# Patient Record
Sex: Female | Born: 1945
Health system: Southern US, Community
[De-identification: ages and names within clinical notes are randomized; demographics above are authoritative.]

## PROBLEM LIST (undated history)

## (undated) DIAGNOSIS — K635 Polyp of colon: Secondary | ICD-10-CM

## (undated) DIAGNOSIS — Z8719 Personal history of other diseases of the digestive system: Secondary | ICD-10-CM

## (undated) DIAGNOSIS — N186 End stage renal disease: Secondary | ICD-10-CM

## (undated) DIAGNOSIS — Z8489 Family history of other specified conditions: Secondary | ICD-10-CM

## (undated) DIAGNOSIS — R519 Headache, unspecified: Secondary | ICD-10-CM

## (undated) DIAGNOSIS — R42 Dizziness and giddiness: Secondary | ICD-10-CM

## (undated) DIAGNOSIS — R011 Cardiac murmur, unspecified: Secondary | ICD-10-CM

## (undated) DIAGNOSIS — K602 Anal fissure, unspecified: Secondary | ICD-10-CM

## (undated) DIAGNOSIS — K859 Acute pancreatitis without necrosis or infection, unspecified: Secondary | ICD-10-CM

## (undated) DIAGNOSIS — G43909 Migraine, unspecified, not intractable, without status migrainosus: Secondary | ICD-10-CM

## (undated) DIAGNOSIS — R51 Headache: Secondary | ICD-10-CM

## (undated) DIAGNOSIS — F319 Bipolar disorder, unspecified: Secondary | ICD-10-CM

## (undated) DIAGNOSIS — K5792 Diverticulitis of intestine, part unspecified, without perforation or abscess without bleeding: Secondary | ICD-10-CM

## (undated) DIAGNOSIS — I10 Essential (primary) hypertension: Secondary | ICD-10-CM

## (undated) DIAGNOSIS — K219 Gastro-esophageal reflux disease without esophagitis: Secondary | ICD-10-CM

## (undated) DIAGNOSIS — N189 Chronic kidney disease, unspecified: Secondary | ICD-10-CM

## (undated) HISTORY — DX: Anal fissure, unspecified: K60.2

## (undated) HISTORY — DX: Diverticulitis of intestine, part unspecified, without perforation or abscess without bleeding: K57.92

## (undated) HISTORY — DX: Polyp of colon: K63.5

## (undated) HISTORY — DX: Bipolar disorder, unspecified: F31.9

## (undated) HISTORY — DX: Chronic kidney disease, unspecified: N18.9

## (undated) HISTORY — DX: Dizziness and giddiness: R42

## (undated) HISTORY — DX: Acute pancreatitis without necrosis or infection, unspecified: K85.90

## (undated) HISTORY — PX: AV FISTULA PLACEMENT: SHX1204

## (undated) HISTORY — PX: ABDOMINAL HYSTERECTOMY: SHX81

## (undated) HISTORY — PX: EYE SURGERY: SHX253

---

## 1992-07-21 HISTORY — PX: BREAST EXCISIONAL BIOPSY: SUR124

## 2001-07-21 HISTORY — PX: CHOLECYSTECTOMY: SHX55

## 2004-07-29 ENCOUNTER — Ambulatory Visit: Payer: Self-pay

## 2005-02-18 ENCOUNTER — Emergency Department: Payer: Self-pay | Admitting: Emergency Medicine

## 2005-02-20 ENCOUNTER — Emergency Department: Payer: Self-pay | Admitting: Unknown Physician Specialty

## 2005-03-02 ENCOUNTER — Emergency Department: Payer: Self-pay | Admitting: Emergency Medicine

## 2005-10-17 ENCOUNTER — Inpatient Hospital Stay: Payer: Self-pay | Admitting: Unknown Physician Specialty

## 2006-03-10 ENCOUNTER — Ambulatory Visit: Payer: Self-pay

## 2007-06-15 ENCOUNTER — Inpatient Hospital Stay: Payer: Self-pay | Admitting: Internal Medicine

## 2007-06-25 ENCOUNTER — Emergency Department: Payer: Self-pay | Admitting: Internal Medicine

## 2007-09-17 ENCOUNTER — Ambulatory Visit: Payer: Self-pay | Admitting: Gastroenterology

## 2007-12-29 ENCOUNTER — Ambulatory Visit: Payer: Self-pay | Admitting: Family Medicine

## 2008-08-26 ENCOUNTER — Emergency Department: Payer: Self-pay | Admitting: Emergency Medicine

## 2009-01-03 ENCOUNTER — Ambulatory Visit: Payer: Self-pay | Admitting: Family Medicine

## 2010-02-27 ENCOUNTER — Other Ambulatory Visit: Payer: Self-pay | Admitting: Internal Medicine

## 2010-07-28 ENCOUNTER — Emergency Department: Payer: Self-pay | Admitting: Emergency Medicine

## 2011-01-06 ENCOUNTER — Emergency Department: Payer: Self-pay | Admitting: Emergency Medicine

## 2011-06-08 ENCOUNTER — Emergency Department: Payer: Self-pay | Admitting: Emergency Medicine

## 2011-11-05 ENCOUNTER — Ambulatory Visit: Payer: Self-pay | Admitting: Internal Medicine

## 2011-11-06 ENCOUNTER — Ambulatory Visit: Payer: Self-pay | Admitting: Internal Medicine

## 2011-12-05 ENCOUNTER — Ambulatory Visit: Payer: Self-pay | Admitting: Ophthalmology

## 2011-12-05 DIAGNOSIS — I10 Essential (primary) hypertension: Secondary | ICD-10-CM

## 2011-12-16 ENCOUNTER — Ambulatory Visit: Payer: Self-pay | Admitting: Ophthalmology

## 2011-12-30 ENCOUNTER — Ambulatory Visit: Payer: Self-pay | Admitting: Ophthalmology

## 2012-01-23 ENCOUNTER — Ambulatory Visit: Payer: Self-pay | Admitting: Ophthalmology

## 2012-07-21 HISTORY — PX: COLONOSCOPY: SHX174

## 2012-09-04 ENCOUNTER — Emergency Department: Payer: Self-pay | Admitting: Emergency Medicine

## 2012-12-06 ENCOUNTER — Ambulatory Visit: Payer: Self-pay | Admitting: Internal Medicine

## 2013-03-03 ENCOUNTER — Emergency Department: Payer: Self-pay | Admitting: Emergency Medicine

## 2013-03-03 LAB — CBC
HCT: 34 % — ABNORMAL LOW (ref 35.0–47.0)
HGB: 12 g/dL (ref 12.0–16.0)
MCH: 33.3 pg (ref 26.0–34.0)
MCV: 94 fL (ref 80–100)
RBC: 3.61 10*6/uL — ABNORMAL LOW (ref 3.80–5.20)

## 2013-03-03 LAB — URINALYSIS, COMPLETE
Bilirubin,UR: NEGATIVE
Glucose,UR: NEGATIVE mg/dL (ref 0–75)
Ketone: NEGATIVE
Nitrite: NEGATIVE
Ph: 6 (ref 4.5–8.0)
Protein: NEGATIVE
RBC,UR: 2 /HPF (ref 0–5)
Specific Gravity: 1.01 (ref 1.003–1.030)
Squamous Epithelial: 1
WBC UR: 8 /HPF (ref 0–5)

## 2013-03-03 LAB — COMPREHENSIVE METABOLIC PANEL
Albumin: 3 g/dL — ABNORMAL LOW (ref 3.4–5.0)
Alkaline Phosphatase: 67 U/L (ref 50–136)
BUN: 27 mg/dL — ABNORMAL HIGH (ref 7–18)
Bilirubin,Total: 0.6 mg/dL (ref 0.2–1.0)
Calcium, Total: 8.5 mg/dL (ref 8.5–10.1)
Chloride: 109 mmol/L — ABNORMAL HIGH (ref 98–107)
Co2: 26 mmol/L (ref 21–32)
Osmolality: 286 (ref 275–301)
SGPT (ALT): 28 U/L (ref 12–78)
Total Protein: 6.5 g/dL (ref 6.4–8.2)

## 2013-03-03 LAB — LIPASE, BLOOD: Lipase: 268 U/L (ref 73–393)

## 2013-04-15 ENCOUNTER — Encounter: Payer: Self-pay | Admitting: *Deleted

## 2013-05-10 ENCOUNTER — Ambulatory Visit (INDEPENDENT_AMBULATORY_CARE_PROVIDER_SITE_OTHER): Payer: Medicare Other | Admitting: General Surgery

## 2013-05-10 ENCOUNTER — Encounter: Payer: Self-pay | Admitting: General Surgery

## 2013-05-10 VITALS — BP 130/74 | HR 68 | Resp 12 | Ht 63.0 in | Wt 187.0 lb

## 2013-05-10 DIAGNOSIS — Z860100 Personal history of colon polyps, unspecified: Secondary | ICD-10-CM | POA: Insufficient documentation

## 2013-05-10 MED ORDER — POLYETHYLENE GLYCOL 3350 17 GM/SCOOP PO POWD: ORAL | Status: DC

## 2013-05-10 NOTE — Patient Instructions (Addendum)
Colonoscopy A colonoscopy is an exam to evaluate your entire colon. In this exam, your colon is cleansed. A long fiberoptic tube is inserted through your rectum and into your colon. The fiberoptic scope (endoscope) is a long bundle of enclosed and very flexible fibers. These fibers transmit light to the area examined and send images from that area to your caregiver. Discomfort is usually minimal. You may be given a drug to help you sleep (sedative) during or prior to the procedure. This exam helps to detect lumps (tumors), polyps, inflammation, and areas of bleeding. Your caregiver may also take a small piece of tissue (biopsy) that will be examined under a microscope. LET YOUR CAREGIVER KNOW ABOUT:   Allergies to food or medicine.  Medicines taken, including vitamins, herbs, eyedrops, over-the-counter medicines, and creams.  Use of steroids (by mouth or creams).  Previous problems with anesthetics or numbing medicines.  History of bleeding problems or blood clots.  Previous surgery.  Other health problems, including diabetes and kidney problems.  Possibility of pregnancy, if this applies. BEFORE THE PROCEDURE   A clear liquid diet may be required for 2 days before the exam.  Ask your caregiver about changing or stopping your regular medications.  Liquid injections (enemas) or laxatives may be required.  A large amount of electrolyte solution may be given to you to drink over a short period of time. This solution is used to clean out your colon.  You should be present 60 minutes prior to your procedure or as directed by your caregiver. AFTER THE PROCEDURE   If you received a sedative or pain relieving medication, you will need to arrange for someone to drive you home.  Occasionally, there is a little blood passed with the first bowel movement. Do not be concerned. FINDING OUT THE RESULTS OF YOUR TEST Not all test results are available during your visit. If your test results are  not back during the visit, make an appointment with your caregiver to find out the results. Do not assume everything is normal if you have not heard from your caregiver or the medical facility. It is important for you to follow up on all of your test results. HOME CARE INSTRUCTIONS   It is not unusual to pass moderate amounts of gas and experience mild abdominal cramping following the procedure. This is due to air being used to inflate your colon during the exam. Walking or a warm pack on your belly (abdomen) may help.  You may resume all normal meals and activities after sedatives and medicines have worn off.  Only take over-the-counter or prescription medicines for pain, discomfort, or fever as directed by your caregiver. Do not use aspirin or blood thinners if a biopsy was taken. Consult your caregiver for medicine usage if biopsies were taken. SEEK IMMEDIATE MEDICAL CARE IF:   You have a fever.  You pass large blood clots or fill a toilet with blood following the procedure. This may also occur 10 to 14 days following the procedure. This is more likely if a biopsy was taken.  You develop abdominal pain that keeps getting worse and cannot be relieved with medicine. Document Released: 07/04/2000 Document Revised: 09/29/2011 Document Reviewed: 02/17/2008 [facility] Patient Information 2014 [place].  Patient has been scheduled for a colonoscopy on 05-25-13 at [facility].

## 2013-05-10 NOTE — Progress Notes (Signed)
Patient ID: Joyce [name], female   DOB: 1945-12-20, 67 y.o.   MRN: [id number]  Chief Complaint  Patient presents with  . Other    evaluation of screening colonscopy    HPI Joyce [name] is a 67 y.o. female.here today for evaluation of surveillance colonoscopy. She had 3 polyps removed in 2009.  Denies any gastrointestinal issues such as constipation or bloody stools. Denies family history of colon cancer.      HPI  Past Medical History  Diagnosis Date  . Colon polyps   . Diverticulitis   . Kidney disease     Dr Holley Raring [name] [facility]    Past Surgical History  Procedure Laterality Date  . Cholecystectomy  2003  . Eye surgery    . Colonoscopy  2008    Dr Candace Cruise  . Abdominal hysterectomy  1990 ?    History reviewed. No pertinent family history.  Social History History  Substance Use Topics  . Smoking status: Never Smoker   . Smokeless tobacco: Not on file  . Alcohol Use: No    Allergies  Allergen Reactions  . Indomethacin Hives    Current Outpatient Prescriptions  Medication Sig Dispense Refill  . Multiple Vitamin (MULTIVITAMIN) capsule Take 1 capsule by mouth daily.      Marland Kitchen lisinopril (PRINIVIL,ZESTRIL) 20 MG tablet Take 20 mg by mouth 2 (two) times daily.       . polyethylene glycol powder (GLYCOLAX/MIRALAX) powder 255 grams one bottle for colonoscopy prep  255 g  0  . valproic acid (DEPAKENE) 250 MG capsule Take 250 mg by mouth 3 (three) times daily.       . verapamil (CALAN-SR) 240 MG CR tablet Take 240 mg by mouth at bedtime.        No current facility-administered medications for this visit.    Review of Systems Review of Systems  Constitutional: Negative.   Respiratory: Negative.   Cardiovascular: Negative.     Blood pressure 130/74, pulse 68, resp. rate 12, height 5\' 3"  (1.6 m), weight 187 lb (84.823 kg).  Physical Exam Physical Exam  Constitutional: She is oriented to person, place, and time. She appears well-developed and well-nourished.  Eyes:  Conjunctivae are normal. No scleral icterus.  Neck: Neck supple.  Cardiovascular: Normal rate, regular rhythm and normal heart sounds.   Pulmonary/Chest: Effort normal and breath sounds normal.  Abdominal: Soft. Bowel sounds are normal. There is no hepatosplenomegaly. There is no tenderness. No hernia.  Lymphadenopathy:    She has no cervical adenopathy.  Neurological: She is alert and oriented to person, place, and time.  Skin: Skin is warm and dry.    Data Reviewed Dr Jennette Kettle notes.  Assessment    History of colon polyps.    Plan    Surveillance  colonoscopy.     Patient has been scheduled for a colonoscopy on 05-25-13 at [facility].   Joyce [name] G 05/10/2013, 12:41 PM

## 2013-05-20 ENCOUNTER — Other Ambulatory Visit: Payer: Self-pay | Admitting: General Surgery

## 2013-05-20 DIAGNOSIS — Z86010 Personal history of colonic polyps: Secondary | ICD-10-CM

## 2013-05-25 ENCOUNTER — Ambulatory Visit: Payer: Self-pay | Admitting: General Surgery

## 2013-05-25 DIAGNOSIS — K5730 Diverticulosis of large intestine without perforation or abscess without bleeding: Secondary | ICD-10-CM

## 2013-05-25 DIAGNOSIS — Z86010 Personal history of colonic polyps: Secondary | ICD-10-CM

## 2013-05-27 ENCOUNTER — Encounter: Payer: Self-pay | Admitting: General Surgery

## 2013-10-31 ENCOUNTER — Ambulatory Visit: Payer: Self-pay | Admitting: Internal Medicine

## 2013-12-17 ENCOUNTER — Inpatient Hospital Stay: Payer: Self-pay | Admitting: Internal Medicine

## 2013-12-17 LAB — COMPREHENSIVE METABOLIC PANEL
ALK PHOS: 46 U/L
Albumin: 3.4 g/dL (ref 3.4–5.0)
Anion Gap: 7 (ref 7–16)
BUN: 41 mg/dL — ABNORMAL HIGH (ref 7–18)
Bilirubin,Total: 0.6 mg/dL (ref 0.2–1.0)
CALCIUM: 9 mg/dL (ref 8.5–10.1)
CO2: 23 mmol/L (ref 21–32)
CREATININE: 3.28 mg/dL — AB (ref 0.60–1.30)
Chloride: 109 mmol/L — ABNORMAL HIGH (ref 98–107)
EGFR (Non-African Amer.): 14 — ABNORMAL LOW
GFR CALC AF AMER: 16 — AB
Glucose: 94 mg/dL (ref 65–99)
Osmolality: 287 (ref 275–301)
POTASSIUM: 4.3 mmol/L (ref 3.5–5.1)
SGOT(AST): 20 U/L (ref 15–37)
SGPT (ALT): 12 U/L (ref 12–78)
Sodium: 139 mmol/L (ref 136–145)
Total Protein: 6.8 g/dL (ref 6.4–8.2)

## 2013-12-17 LAB — CBC
HCT: 31.8 % — ABNORMAL LOW (ref 35.0–47.0)
HGB: 10.7 g/dL — AB (ref 12.0–16.0)
MCH: 32.7 pg (ref 26.0–34.0)
MCHC: 33.8 g/dL (ref 32.0–36.0)
MCV: 97 fL (ref 80–100)
PLATELETS: 217 10*3/uL (ref 150–440)
RBC: 3.28 10*6/uL — ABNORMAL LOW (ref 3.80–5.20)
RDW: 12.4 % (ref 11.5–14.5)
WBC: 6.9 10*3/uL (ref 3.6–11.0)

## 2013-12-17 LAB — TROPONIN I: Troponin-I: <0.02 ng/mL

## 2013-12-18 LAB — CK TOTAL AND CKMB (NOT AT ARMC)
CK, TOTAL: 79 U/L
CK, Total: 80 U/L
CK-MB: 0.8 ng/mL (ref 0.5–3.6)
CK-MB: 1.1 ng/mL (ref 0.5–3.6)

## 2013-12-18 LAB — BASIC METABOLIC PANEL
Anion Gap: 3 — ABNORMAL LOW (ref 7–16)
BUN: 39 mg/dL — AB (ref 7–18)
CALCIUM: 8.2 mg/dL — AB (ref 8.5–10.1)
Chloride: 117 mmol/L — ABNORMAL HIGH (ref 98–107)
Co2: 23 mmol/L (ref 21–32)
Creatinine: 3.01 mg/dL — ABNORMAL HIGH (ref 0.60–1.30)
EGFR (Non-African Amer.): 15 — ABNORMAL LOW
GFR CALC AF AMER: 18 — AB
Glucose: 78 mg/dL (ref 65–99)
Osmolality: 293 (ref 275–301)
POTASSIUM: 4.4 mmol/L (ref 3.5–5.1)
Sodium: 143 mmol/L (ref 136–145)

## 2013-12-18 LAB — CBC WITH DIFFERENTIAL/PLATELET
BASOS ABS: 0 10*3/uL (ref 0.0–0.1)
Basophil %: 0.6 %
Eosinophil #: 0.1 10*3/uL (ref 0.0–0.7)
Eosinophil %: 2.3 %
HCT: 29.4 % — AB (ref 35.0–47.0)
HGB: 9.9 g/dL — ABNORMAL LOW (ref 12.0–16.0)
LYMPHS PCT: 32 %
Lymphocyte #: 1.7 10*3/uL (ref 1.0–3.6)
MCH: 32.7 pg (ref 26.0–34.0)
MCHC: 33.6 g/dL (ref 32.0–36.0)
MCV: 97 fL (ref 80–100)
MONOS PCT: 9.3 %
Monocyte #: 0.5 x10 3/mm (ref 0.2–0.9)
NEUTROS PCT: 55.8 %
Neutrophil #: 2.9 10*3/uL (ref 1.4–6.5)
Platelet: 206 10*3/uL (ref 150–440)
RBC: 3.02 10*6/uL — ABNORMAL LOW (ref 3.80–5.20)
RDW: 12.5 % (ref 11.5–14.5)
WBC: 5.2 10*3/uL (ref 3.6–11.0)

## 2013-12-18 LAB — TROPONIN I

## 2013-12-22 ENCOUNTER — Ambulatory Visit: Payer: Self-pay | Admitting: Internal Medicine

## 2014-05-03 ENCOUNTER — Emergency Department: Payer: Self-pay | Admitting: Emergency Medicine

## 2014-05-03 LAB — URINALYSIS, COMPLETE
BILIRUBIN, UR: NEGATIVE
BLOOD: NEGATIVE
Bacteria: NONE SEEN
GLUCOSE, UR: NEGATIVE mg/dL (ref 0–75)
Ketone: NEGATIVE
Nitrite: NEGATIVE
PH: 5 (ref 4.5–8.0)
Protein: NEGATIVE
RBC,UR: 1 /HPF (ref 0–5)
SPECIFIC GRAVITY: 1.013 (ref 1.003–1.030)

## 2014-05-03 LAB — CBC WITH DIFFERENTIAL/PLATELET
BASOS PCT: 0.2 %
Basophil #: 0 10*3/uL (ref 0.0–0.1)
EOS ABS: 0.1 10*3/uL (ref 0.0–0.7)
Eosinophil %: 1.1 %
HCT: 34.4 % — ABNORMAL LOW (ref 35.0–47.0)
HGB: 11.3 g/dL — AB (ref 12.0–16.0)
Lymphocyte #: 2.2 10*3/uL (ref 1.0–3.6)
Lymphocyte %: 28 %
MCH: 32.3 pg (ref 26.0–34.0)
MCHC: 32.7 g/dL (ref 32.0–36.0)
MCV: 99 fL (ref 80–100)
Monocyte #: 0.8 x10 3/mm (ref 0.2–0.9)
Monocyte %: 9.4 %
NEUTROS PCT: 61.3 %
Neutrophil #: 4.9 10*3/uL (ref 1.4–6.5)
Platelet: 238 10*3/uL (ref 150–440)
RBC: 3.48 10*6/uL — AB (ref 3.80–5.20)
RDW: 13.5 % (ref 11.5–14.5)
WBC: 8 10*3/uL (ref 3.6–11.0)

## 2014-05-03 LAB — COMPREHENSIVE METABOLIC PANEL
ALBUMIN: 3.3 g/dL — AB (ref 3.4–5.0)
Alkaline Phosphatase: 58 U/L
Anion Gap: 8 (ref 7–16)
BILIRUBIN TOTAL: 0.9 mg/dL (ref 0.2–1.0)
BUN: 36 mg/dL — ABNORMAL HIGH (ref 7–18)
CO2: 24 mmol/L (ref 21–32)
Calcium, Total: 8.6 mg/dL (ref 8.5–10.1)
Chloride: 107 mmol/L (ref 98–107)
Creatinine: 3.47 mg/dL — ABNORMAL HIGH (ref 0.60–1.30)
EGFR (African American): 17 — ABNORMAL LOW
EGFR (Non-African Amer.): 14 — ABNORMAL LOW
Glucose: 91 mg/dL (ref 65–99)
Osmolality: 285 (ref 275–301)
POTASSIUM: 3.4 mmol/L — AB (ref 3.5–5.1)
SGOT(AST): 30 U/L (ref 15–37)
SGPT (ALT): 30 U/L
SODIUM: 139 mmol/L (ref 136–145)
Total Protein: 7 g/dL (ref 6.4–8.2)

## 2014-05-03 LAB — LIPASE, BLOOD: LIPASE: 320 U/L (ref 73–393)

## 2014-05-06 ENCOUNTER — Emergency Department: Payer: Self-pay | Admitting: Emergency Medicine

## 2014-05-06 LAB — CBC WITH DIFFERENTIAL/PLATELET
Basophil #: 0.1 10*3/uL (ref 0.0–0.1)
Basophil %: 2 %
EOS PCT: 3.8 %
Eosinophil #: 0.2 10*3/uL (ref 0.0–0.7)
HCT: 34.8 % — ABNORMAL LOW (ref 35.0–47.0)
HGB: 11.3 g/dL — ABNORMAL LOW (ref 12.0–16.0)
LYMPHS ABS: 1 10*3/uL (ref 1.0–3.6)
LYMPHS PCT: 16.2 %
MCH: 32.1 pg (ref 26.0–34.0)
MCHC: 32.4 g/dL (ref 32.0–36.0)
MCV: 99 fL (ref 80–100)
MONOS PCT: 8.7 %
Monocyte #: 0.6 x10 3/mm (ref 0.2–0.9)
Neutrophil #: 4.5 10*3/uL (ref 1.4–6.5)
Neutrophil %: 69.3 %
Platelet: 258 10*3/uL (ref 150–440)
RBC: 3.51 10*6/uL — ABNORMAL LOW (ref 3.80–5.20)
RDW: 13.2 % (ref 11.5–14.5)
WBC: 6.5 10*3/uL (ref 3.6–11.0)

## 2014-05-06 LAB — COMPREHENSIVE METABOLIC PANEL
ALK PHOS: 52 U/L
AST: 37 U/L (ref 15–37)
Albumin: 3.1 g/dL — ABNORMAL LOW (ref 3.4–5.0)
Anion Gap: 9 (ref 7–16)
BILIRUBIN TOTAL: 0.9 mg/dL (ref 0.2–1.0)
BUN: 29 mg/dL — ABNORMAL HIGH (ref 7–18)
CO2: 22 mmol/L (ref 21–32)
Calcium, Total: 8.4 mg/dL — ABNORMAL LOW (ref 8.5–10.1)
Chloride: 113 mmol/L — ABNORMAL HIGH (ref 98–107)
Creatinine: 3.07 mg/dL — ABNORMAL HIGH (ref 0.60–1.30)
EGFR (African American): 20 — ABNORMAL LOW
GFR CALC NON AF AMER: 16 — AB
Glucose: 92 mg/dL (ref 65–99)
OSMOLALITY: 292 (ref 275–301)
Potassium: 3.6 mmol/L (ref 3.5–5.1)
SGPT (ALT): 30 U/L
SODIUM: 144 mmol/L (ref 136–145)
TOTAL PROTEIN: 6.4 g/dL (ref 6.4–8.2)

## 2014-05-06 LAB — URINALYSIS, COMPLETE
Bacteria: NONE SEEN
Bilirubin,UR: NEGATIVE
Blood: NEGATIVE
Glucose,UR: NEGATIVE mg/dL (ref 0–75)
Ketone: NEGATIVE
Nitrite: NEGATIVE
PROTEIN: NEGATIVE
Ph: 5 (ref 4.5–8.0)
RBC,UR: 1 /HPF (ref 0–5)
SPECIFIC GRAVITY: 1.01 (ref 1.003–1.030)
Squamous Epithelial: 1
WBC UR: 3 /HPF (ref 0–5)

## 2014-05-06 LAB — LIPASE, BLOOD: LIPASE: 240 U/L (ref 73–393)

## 2014-05-10 ENCOUNTER — Inpatient Hospital Stay: Payer: Self-pay | Admitting: Internal Medicine

## 2014-05-10 LAB — COMPREHENSIVE METABOLIC PANEL
ALT: 20 U/L
Albumin: 3 g/dL — ABNORMAL LOW (ref 3.4–5.0)
Alkaline Phosphatase: 48 U/L
Anion Gap: 9 (ref 7–16)
BUN: 21 mg/dL — ABNORMAL HIGH (ref 7–18)
Bilirubin,Total: 0.9 mg/dL (ref 0.2–1.0)
CALCIUM: 8.3 mg/dL — AB (ref 8.5–10.1)
CHLORIDE: 116 mmol/L — AB (ref 98–107)
CO2: 20 mmol/L — AB (ref 21–32)
Creatinine: 2.64 mg/dL — ABNORMAL HIGH (ref 0.60–1.30)
EGFR (African American): 23 — ABNORMAL LOW
EGFR (Non-African Amer.): 19 — ABNORMAL LOW
Glucose: 110 mg/dL — ABNORMAL HIGH (ref 65–99)
Osmolality: 292 (ref 275–301)
POTASSIUM: 3.4 mmol/L — AB (ref 3.5–5.1)
SGOT(AST): 19 U/L (ref 15–37)
Sodium: 145 mmol/L (ref 136–145)
Total Protein: 6 g/dL — ABNORMAL LOW (ref 6.4–8.2)

## 2014-05-10 LAB — URINALYSIS, COMPLETE
BACTERIA: NONE SEEN
BILIRUBIN, UR: NEGATIVE
Blood: NEGATIVE
Glucose,UR: NEGATIVE mg/dL (ref 0–75)
Hyaline Cast: 3
Ketone: NEGATIVE
LEUKOCYTE ESTERASE: NEGATIVE
Nitrite: NEGATIVE
Ph: 5 (ref 4.5–8.0)
Protein: NEGATIVE
RBC,UR: 1 /HPF (ref 0–5)
Specific Gravity: 1.009 (ref 1.003–1.030)
Squamous Epithelial: 2
WBC UR: 2 /HPF (ref 0–5)

## 2014-05-10 LAB — CBC
HCT: 33.4 % — ABNORMAL LOW (ref 35.0–47.0)
HGB: 11.1 g/dL — AB (ref 12.0–16.0)
MCH: 32.7 pg (ref 26.0–34.0)
MCHC: 33.4 g/dL (ref 32.0–36.0)
MCV: 98 fL (ref 80–100)
Platelet: 239 10*3/uL (ref 150–440)
RBC: 3.41 10*6/uL — ABNORMAL LOW (ref 3.80–5.20)
RDW: 13.2 % (ref 11.5–14.5)
WBC: 6.5 10*3/uL (ref 3.6–11.0)

## 2014-05-10 LAB — LIPASE, BLOOD: Lipase: 412 U/L — ABNORMAL HIGH (ref 73–393)

## 2014-05-11 LAB — COMPREHENSIVE METABOLIC PANEL
ALT: 70 U/L — AB
Albumin: 2.4 g/dL — ABNORMAL LOW (ref 3.4–5.0)
Alkaline Phosphatase: 59 U/L
Anion Gap: 8 (ref 7–16)
BUN: 17 mg/dL (ref 7–18)
Bilirubin,Total: 0.6 mg/dL (ref 0.2–1.0)
CALCIUM: 7.7 mg/dL — AB (ref 8.5–10.1)
CHLORIDE: 121 mmol/L — AB (ref 98–107)
Co2: 21 mmol/L (ref 21–32)
Creatinine: 2.35 mg/dL — ABNORMAL HIGH (ref 0.60–1.30)
EGFR (African American): 27 — ABNORMAL LOW
EGFR (Non-African Amer.): 22 — ABNORMAL LOW
Glucose: 89 mg/dL (ref 65–99)
Osmolality: 299 (ref 275–301)
Potassium: 3.8 mmol/L (ref 3.5–5.1)
SGOT(AST): 77 U/L — ABNORMAL HIGH (ref 15–37)
SODIUM: 150 mmol/L — AB (ref 136–145)
Total Protein: 4.9 g/dL — ABNORMAL LOW (ref 6.4–8.2)

## 2014-05-11 LAB — CBC WITH DIFFERENTIAL/PLATELET
Basophil #: 0 10*3/uL (ref 0.0–0.1)
Basophil %: 0.5 %
EOS ABS: 0.1 10*3/uL (ref 0.0–0.7)
EOS PCT: 1.5 %
HCT: 28.1 % — ABNORMAL LOW (ref 35.0–47.0)
HGB: 9.5 g/dL — AB (ref 12.0–16.0)
LYMPHS ABS: 1.6 10*3/uL (ref 1.0–3.6)
Lymphocyte %: 28.3 %
MCH: 33.3 pg (ref 26.0–34.0)
MCHC: 33.9 g/dL (ref 32.0–36.0)
MCV: 98 fL (ref 80–100)
MONOS PCT: 11.3 %
Monocyte #: 0.6 x10 3/mm (ref 0.2–0.9)
Neutrophil #: 3.3 10*3/uL (ref 1.4–6.5)
Neutrophil %: 58.4 %
Platelet: 186 10*3/uL (ref 150–440)
RBC: 2.86 10*6/uL — AB (ref 3.80–5.20)
RDW: 13 % (ref 11.5–14.5)
WBC: 5.6 10*3/uL (ref 3.6–11.0)

## 2014-05-11 LAB — TSH: Thyroid Stimulating Horm: 2.74 u[IU]/mL

## 2014-05-11 LAB — MAGNESIUM: MAGNESIUM: 1.4 mg/dL — AB

## 2014-05-11 LAB — CLOSTRIDIUM DIFFICILE(ARMC)

## 2014-05-11 LAB — LIPASE, BLOOD: LIPASE: 1210 U/L — AB (ref 73–393)

## 2014-05-11 LAB — OCCULT BLOOD X 1 CARD TO LAB, STOOL: Occult Blood, Feces: NEGATIVE

## 2014-05-12 LAB — COMPREHENSIVE METABOLIC PANEL
Albumin: 2.4 g/dL — ABNORMAL LOW (ref 3.4–5.0)
Alkaline Phosphatase: 50 U/L
Anion Gap: 6 — ABNORMAL LOW (ref 7–16)
BUN: 15 mg/dL (ref 7–18)
Bilirubin,Total: 0.7 mg/dL (ref 0.2–1.0)
CALCIUM: 8.2 mg/dL — AB (ref 8.5–10.1)
CHLORIDE: 119 mmol/L — AB (ref 98–107)
CO2: 23 mmol/L (ref 21–32)
Creatinine: 2.23 mg/dL — ABNORMAL HIGH (ref 0.60–1.30)
EGFR (African American): 28 — ABNORMAL LOW
EGFR (Non-African Amer.): 23 — ABNORMAL LOW
Glucose: 96 mg/dL (ref 65–99)
Osmolality: 295 (ref 275–301)
POTASSIUM: 3.4 mmol/L — AB (ref 3.5–5.1)
SGOT(AST): 27 U/L (ref 15–37)
SGPT (ALT): 41 U/L
SODIUM: 148 mmol/L — AB (ref 136–145)
TOTAL PROTEIN: 5 g/dL — AB (ref 6.4–8.2)

## 2014-05-12 LAB — LIPASE, BLOOD: LIPASE: 536 U/L — AB (ref 73–393)

## 2014-05-13 LAB — BASIC METABOLIC PANEL
Anion Gap: 7 (ref 7–16)
BUN: 11 mg/dL (ref 7–18)
CALCIUM: 8.4 mg/dL — AB (ref 8.5–10.1)
CO2: 24 mmol/L (ref 21–32)
Chloride: 118 mmol/L — ABNORMAL HIGH (ref 98–107)
Creatinine: 2.34 mg/dL — ABNORMAL HIGH (ref 0.60–1.30)
EGFR (African American): 27 — ABNORMAL LOW
EGFR (Non-African Amer.): 22 — ABNORMAL LOW
Glucose: 119 mg/dL — ABNORMAL HIGH (ref 65–99)
OSMOLALITY: 297 (ref 275–301)
POTASSIUM: 3.9 mmol/L (ref 3.5–5.1)
SODIUM: 149 mmol/L — AB (ref 136–145)

## 2014-05-13 LAB — LIPASE, BLOOD: LIPASE: 569 U/L — AB (ref 73–393)

## 2014-05-13 LAB — URINE CULTURE

## 2014-05-14 LAB — LIPID PANEL
CHOLESTEROL: 105 mg/dL (ref 0–200)
HDL Cholesterol: 37 mg/dL — ABNORMAL LOW (ref 40–60)
LDL CHOLESTEROL, CALC: 43 mg/dL (ref 0–100)
TRIGLYCERIDES: 127 mg/dL (ref 0–200)
VLDL Cholesterol, Calc: 25 mg/dL (ref 5–40)

## 2014-05-14 LAB — COMPREHENSIVE METABOLIC PANEL
Albumin: 2.7 g/dL — ABNORMAL LOW (ref 3.4–5.0)
Alkaline Phosphatase: 53 U/L
Anion Gap: 9 (ref 7–16)
BUN: 11 mg/dL (ref 7–18)
Bilirubin,Total: 0.5 mg/dL (ref 0.2–1.0)
CO2: 21 mmol/L (ref 21–32)
Calcium, Total: 8.3 mg/dL — ABNORMAL LOW (ref 8.5–10.1)
Chloride: 118 mmol/L — ABNORMAL HIGH (ref 98–107)
Creatinine: 2.33 mg/dL — ABNORMAL HIGH (ref 0.60–1.30)
EGFR (African American): 27 — ABNORMAL LOW
EGFR (Non-African Amer.): 22 — ABNORMAL LOW
Glucose: 113 mg/dL — ABNORMAL HIGH (ref 65–99)
OSMOLALITY: 294 (ref 275–301)
Potassium: 3.7 mmol/L (ref 3.5–5.1)
SGOT(AST): 18 U/L (ref 15–37)
SGPT (ALT): 25 U/L
Sodium: 148 mmol/L — ABNORMAL HIGH (ref 136–145)
TOTAL PROTEIN: 5.3 g/dL — AB (ref 6.4–8.2)

## 2014-05-14 LAB — STOOL CULTURE

## 2014-05-14 LAB — LIPASE, BLOOD: Lipase: 542 U/L — ABNORMAL HIGH (ref 73–393)

## 2014-05-15 LAB — BASIC METABOLIC PANEL
Anion Gap: 7 (ref 7–16)
BUN: 9 mg/dL (ref 7–18)
CHLORIDE: 115 mmol/L — AB (ref 98–107)
CO2: 22 mmol/L (ref 21–32)
Calcium, Total: 8.8 mg/dL (ref 8.5–10.1)
Creatinine: 2.33 mg/dL — ABNORMAL HIGH (ref 0.60–1.30)
EGFR (African American): 27 — ABNORMAL LOW
GFR CALC NON AF AMER: 22 — AB
Glucose: 118 mg/dL — ABNORMAL HIGH (ref 65–99)
Osmolality: 287 (ref 275–301)
Potassium: 3.7 mmol/L (ref 3.5–5.1)
Sodium: 144 mmol/L (ref 136–145)

## 2014-05-15 LAB — PLATELET COUNT: Platelet: 184 10*3/uL (ref 150–440)

## 2014-05-15 LAB — LIPASE, BLOOD: Lipase: 407 U/L — ABNORMAL HIGH (ref 73–393)

## 2014-05-22 ENCOUNTER — Encounter: Payer: Self-pay | Admitting: General Surgery

## 2014-08-09 DIAGNOSIS — D229 Melanocytic nevi, unspecified: Secondary | ICD-10-CM | POA: Diagnosis not present

## 2014-08-09 DIAGNOSIS — B079 Viral wart, unspecified: Secondary | ICD-10-CM | POA: Diagnosis not present

## 2014-08-23 DIAGNOSIS — Z79899 Other long term (current) drug therapy: Secondary | ICD-10-CM | POA: Diagnosis not present

## 2014-09-01 DIAGNOSIS — Q613 Polycystic kidney, unspecified: Secondary | ICD-10-CM | POA: Diagnosis not present

## 2014-09-01 DIAGNOSIS — N185 Chronic kidney disease, stage 5: Secondary | ICD-10-CM | POA: Diagnosis not present

## 2014-09-01 DIAGNOSIS — E559 Vitamin D deficiency, unspecified: Secondary | ICD-10-CM | POA: Diagnosis not present

## 2014-09-01 DIAGNOSIS — I10 Essential (primary) hypertension: Secondary | ICD-10-CM | POA: Diagnosis not present

## 2014-09-01 DIAGNOSIS — N2581 Secondary hyperparathyroidism of renal origin: Secondary | ICD-10-CM | POA: Diagnosis not present

## 2014-09-06 DIAGNOSIS — F3177 Bipolar disorder, in partial remission, most recent episode mixed: Secondary | ICD-10-CM | POA: Diagnosis not present

## 2014-09-20 DIAGNOSIS — R5383 Other fatigue: Secondary | ICD-10-CM | POA: Diagnosis not present

## 2014-09-21 DIAGNOSIS — R5383 Other fatigue: Secondary | ICD-10-CM | POA: Diagnosis not present

## 2014-09-27 DIAGNOSIS — F3177 Bipolar disorder, in partial remission, most recent episode mixed: Secondary | ICD-10-CM | POA: Diagnosis not present

## 2014-09-29 DIAGNOSIS — Q613 Polycystic kidney, unspecified: Secondary | ICD-10-CM | POA: Diagnosis not present

## 2014-09-29 DIAGNOSIS — E559 Vitamin D deficiency, unspecified: Secondary | ICD-10-CM | POA: Diagnosis not present

## 2014-09-29 DIAGNOSIS — I10 Essential (primary) hypertension: Secondary | ICD-10-CM | POA: Diagnosis not present

## 2014-09-29 DIAGNOSIS — N185 Chronic kidney disease, stage 5: Secondary | ICD-10-CM | POA: Diagnosis not present

## 2014-09-29 DIAGNOSIS — D631 Anemia in chronic kidney disease: Secondary | ICD-10-CM | POA: Diagnosis not present

## 2014-09-29 DIAGNOSIS — N2581 Secondary hyperparathyroidism of renal origin: Secondary | ICD-10-CM | POA: Diagnosis not present

## 2014-10-09 ENCOUNTER — Other Ambulatory Visit: Payer: Self-pay | Admitting: *Deleted

## 2014-10-09 ENCOUNTER — Other Ambulatory Visit: Payer: Self-pay | Admitting: Pharmacist

## 2014-10-09 ENCOUNTER — Encounter: Payer: Self-pay | Admitting: *Deleted

## 2014-10-09 NOTE — Patient Outreach (Addendum)
10/09/2014  Emerald Sanpedro Massimino 05/04/1946 [id number]  Joyce [name] is an 69 y.o. female  Subjective: Pt states her BUN has improved to 17 from 14 a few moths ago and she wants to do everything she can to stay off dialysis. Pt stated her kidney doctor told her if her BUN was above 15 she could possibly not have to be put on dialysis. She states her appointment is April 18 with the surgeon to place the dialysis access site. She states she could use more exercise and to keep a closer check on her blood pressure.   Objective: Review of Systems  HENT: Positive for hearing loss.   Genitourinary: Negative.   Psychiatric/Behavioral: Negative.   All other systems reviewed and are negative.   Physical Exam  Constitutional: She is oriented to person, place, and time. She appears well-developed and well-nourished.  Cardiovascular: Normal rate, regular rhythm and normal heart sounds.   Respiratory: Breath sounds normal.  GI: Soft. Bowel sounds are normal.  Musculoskeletal: Normal range of motion.  Neurological: She is alert and oriented to person, place, and time.  Skin: Skin is warm and dry.  Psychiatric: She has a normal mood and affect.    Current Medications: Current Outpatient Prescriptions  Medication Sig Dispense Refill  . acetaminophen (TYLENOL) 325 MG tablet Take 650 mg by mouth every 4 (four) hours as needed for mild pain.    . carbamazepine (TEGRETOL XR) 200 MG 12 hr tablet Take 200 mg by mouth 2 (two) times daily.    . cholecalciferol (VITAMIN D) 1000 UNITS tablet Take 1,000 Units by mouth daily.    . Cyanocobalamin (B-12 COMPLIANCE INJECTION IJ) Inject as directed every 30 (thirty) days.    Marland Kitchen lisinopril (PRINIVIL,ZESTRIL) 5 MG tablet Take 5 mg by mouth daily. This is the dose listed on the medication bottle    . QUEtiapine (SEROQUEL) 25 MG tablet Take 25 mg by mouth at bedtime. She takes two 25mg  tabs at bedtime    . verapamil (CALAN-SR) 240 MG CR tablet Take 240 mg by mouth  at bedtime.     Marland Kitchen lisinopril (PRINIVIL,ZESTRIL) 20 MG tablet Take 20 mg by mouth 2 (two) times daily.     . Multiple Vitamin (MULTIVITAMIN) capsule Take 1 capsule by mouth daily.    . polyethylene glycol powder (GLYCOLAX/MIRALAX) powder 255 grams one bottle for colonoscopy prep (Patient not taking: Reported on 10/10/2014) 255 g 0  . valproic acid (DEPAKENE) 250 MG capsule Take 250 mg by mouth 3 (three) times daily.      No current facility-administered medications for this visit.    Functional Status: In your present state of health, do you have any difficulty performing the following activities: 10/09/2014 10/09/2014  Is the patient deaf or have difficulty hearing? Y N  Hearing - N  Vision - N  Difficulty concentrating or making decisions - N  Walking or climbing stairs? - N  Doing errands, shopping? - N  Conservation officer, nature and eating ? - N  Using the Toilet? - N  In the past six months, have you accidently leaked urine? - N  Do you have problems with loss of bowel control? - N  Managing your Medications? - N  Managing your Finances? - N  Housekeeping or managing your Housekeeping? - N    Fall/Depression Screening: PHQ 2/9 Scores 10/09/2014  PHQ - 2 Score 0    Assessment: Health assessment done Patient historian.   Plan: RNCM printed EMMI education about heart  healthy diet for this visit, will investigate specifics for a renal guarding diet to discuss during next visit.  Plan: RNCM to call Nephrologist and inquire about patient's appointment on 4/18 to have dialysis site placed, and let pt know of MD's plan in the next 7 days. Plan: RNCM inquired about the silver sneakers program at the [facility] per pt's request and confirmed pt's eligibility.  Plan: RNCM scheduled follow up home visit for 4/21  [facility] CM Care Plan        Patient Outreach from 10/09/2014 in [place] Problem One  Maintaining kidney function to a level of staying off [place]  for Problem One  Active   Interventions for Problem One Long Term Goal  Discussed the importance of a heart and kidney prudent diet. EMMI education printed and reviewed with patient on living with chronic kidney disease.    [facility] Long Term Goal (31-90 days)  Patient will be able to remain off dialysis, by keeping BUN >15 for the next 90 days   [facility] Long Term Goal Start Date  10/09/14   [facility] CM Short Term Goal #1 (0-30 days)  Pt to go [facility] her home and check blood pressure and record once a week for the next 30 days   [facility] CM Short Term Goal #1 Start Date  10/09/14   [facility] CM Short Term Goal #2 (0-30 days)  Pt will do thirty minutes of cardiovascular exercise for 30 minutes 3 times per week for 30 days   [facility] CM Short Term Goal #2 Start Date  10/09/14     Bacilio Abascal RN, BSN  [facility] Care Management [phone])

## 2014-10-09 NOTE — Patient Outreach (Signed)
Joyce [name] is a 69 year old female referred to pharmacy for medication assistance, specifically for affording carbamazepine.   I obtained verbal consent to speak with patient regarding her medications.  She reported that she is able to afford all of her medications but that she was "shocked" by the price of her carbamazepine, which costs her about $130 for a 90 day supply.  I told patient that unfortunately, this medication was generic, and that there were no assistance program available. She verbalized understanding and that she could continue to pay for this medication but if it became an issue, she would discuss it with her physician.   Nicoletta Ba, PharmD, [facility] [place] [facility] Resident [phone]

## 2014-10-10 NOTE — Patient Instructions (Addendum)
Barbetta,  It was a pleasure meeting with you today. Here are some of the items we discussed. I look forward to our next appointment in April. Happy Spring time!  [place] for Chronic Kidney Disease When your kidneys are not working well, they cannot remove waste and excess substances from your blood as effectively as they did before. This can lead to a buildup and imbalance of these substances, which can affect how your body functions. This buildup can also make your kidneys work harder, causing even more damage. You may need to eat less of certain foods that can lead to the buildup of these substances in your body. By making the changes to your diet that are recommended by your dietitian or health care provider, you could possibly help prevent further kidney damage and delay or prevent the need for dialysis. The following information can help give you a basic understanding of these substances and how they affect your bodily functions. The information also gives examples of foods that contain the highest amounts of these substances. WHAT DO I NEED TO KNOW ABOUT SUBSTANCES IN MY FOOD THAT I MAY NEED TO ADJUST? Food adjustments will be different for each person with chronic kidney disease. It is important that you see a dietitian who can help you determine the specific adjustments that you will need to make for each of the following substances: Potassium Potassium affects how steadily your heart beats. If too much potassium builds up in your blood, it can cause an irregular heartbeat or even a heart attack. Examples of foods rich in potassium include:  Milk.  Fruits.  Vegetables. Phosphorus Phosphorus is a mineral found in your bones. A balance between calcium and phosphorous is needed to build and maintain healthy bones. Too much phosphorus pulls calcium from your bones. This can make your bones weak and more likely to break. Too much phosphorus can also make your skin itch. Examples of  foods rich in phosphorus include:  Milk and cheese.  Dried beans.  Peas.  Colas.  Nuts and peanut butter. Animal Protein Animal protein helps you make and keep muscle. It also helps in the repair of your body's cells and tissues. One of the natural breakdown products of protein is a waste product called urea. When your kidneys are not working properly, they cannot remove wastes such as urea like they did before you developed chronic kidney disease. You will likely need to limit the amount of protein you eat to help prevent a buildup of urea in your blood. Examples of animal protein include:  Meat (all types).  Fish and seafood.  Poultry.  Eggs. Sodium Sodium, which is found in salt, helps maintain a healthy balance of fluids in your body. Too much sodium can increase your blood pressure level and have a negative affect on the function of your heart and lungs. Too much sodium also can cause your body to retain too much fluid, making your kidneys work harder. Examples of foods with high levels of sodium include:  Salt seasonings.  Soy sauce.  Cured and processed meats.  Salted crackers and snack foods.  Fast food.  Canned soups and most canned foods. Glucose Glucose provides energy for your body. If you have diabetes mellitus that is not properly controlled, you have too much glucose in your blood. Too much glucose in your blood can worsen the function of your kidneys by damaging small blood vessels. This prevents enough blood flow to your kidneys to give them  what they need to work. If you have diabetes mellitus and chronic kidney disease, it is important to maintain your blood glucose at a level recommended by your health care provider. SHOULD I TAKE A VITAMIN AND MINERAL SUPPLEMENT? Because you may need to avoid eating certain foods, you may not get all of the vitamins and minerals that would normally come from those foods. Your health care provider or dietitian may recommend  that you take a supplement to ensure that you get all of the vitamins and minerals that your body needs.  Document Released: 09/27/2002 Document Revised: 11/21/2013 Document Reviewed: 06/03/2013 [facility] Patient Information 2015 [place], [place]. This information is not intended to replace advice given to you by your health care provider. Make sure you discuss any questions you have with your health care provider.   Monitor B/P one time per week via [facility] or your sister's automated b/p cuff and record it.  Hypertension Hypertension is another name for high blood pressure. High blood pressure forces your heart to work harder to pump blood. A blood pressure reading has two numbers, which includes a higher number over a lower number (example: 110/72). HOME CARE   Have your blood pressure rechecked by your doctor.  Only take medicine as told by your doctor. Follow the directions carefully. The medicine does not work as well if you skip doses. Skipping doses also puts you at risk for problems.  Do not smoke.  Monitor your blood pressure at home as told by your doctor. GET HELP IF:  You think you are having a reaction to the medicine you are taking.  You have repeat headaches or feel dizzy.  You have puffiness (swelling) in your ankles.  You have trouble with your vision. GET HELP RIGHT AWAY IF:   You get a very bad headache and are confused.  You feel weak, numb, or faint.  You get chest or belly (abdominal) pain.  You throw up (vomit).  You cannot breathe very well. MAKE SURE YOU:   Understand these instructions.  Will watch your condition.  Will get help right away if you are not doing well or get worse. Document Released: 12/24/2007 Document Revised: 07/12/2013 Document Reviewed: 04/29/2013 [facility] Patient Information 2015 [place], [place]. This information is not intended to replace advice given to you by your health care provider. Make sure you discuss any questions you have  with your health care provider.  Exercise 30 minutes three times per week. SAFETY WITH EXERCISE: TIPS - Hold onto a table or sturdy support if you are having trouble with your balance. - Ask for assistance if you cannot complete the exercise safely by yourself. - Discontinue exercising if you feel tired, nauseated, light-headed, dizzy, have chest pain, pounding headache, sudden confusion or difficulty speaking. - If it hurts to do a portion of any exercise or the entire exercise, stop.   Here is the class schedule for the [facility], as we dicussed you are eligible for a free regular membership through your insurance plan.    [facility]  GROUP EXERCISE SCHEDULE  Effective March, 2016   Mondays 6:00am Core Training  8:00am Shape Up with Sarge  8:30am Cardio Dance Blast  9:00am Shape Up with Sarge  9:30am Athletic Core & Stretch  10:00am Aqua Yoga  10:30am Young at Heart  11:30am Line Dancing  12:15pm X-Bike  4:30pm Zumba  5:30pm Kickboxing  5:30pm X-Bike  6:30 Power Sculpt    Tuesdays 5:35am X-Bike  6:00am Core Training  8:00am Shape Up With Sarge  8:30am Power Sculpt  9:00am Shape Up with Sarge  9:30am Zumba  10:00am Aqua Fusion  10:30am Young @ Heart  11:30am Line Dancing  12:15pm X-Bike  4:30pm Zumba  5:30pm Instructor's Choice  5:30pm X-Bike  6:15 Meditation (x-bike room)  6:30pm Pilates  7:05pm Hearts @ Work  7:05pm Deep Water Workout   Wednesdays 5:35am Power Sculpt  8:00am Morning Energizer  8:30am Step  9:00am Hearts @ Work  9:30am Power Sculpt  10:00am Arthritis Class  11:00am T'ai Chi Ch'uan ($$)  12:15pm Pilates  1:30pm SilverSneakers  2:30pm Line Dancing  4:30pm Yoga  5:30pm Zumba  5:30pm X-Bike  6:30pm Pilates  7:05pm Hearts @ Work  7:05pm Deep Water Workout   Thursdays  6:00am Power Sculpt  8:00am Morning Energizer  8:30am Power Sculpt  9:00am Hearts @ Work  9:30am Zumba  10:00am Arthritis Class  10:30am Yoga  11:30am  Meditation  12:15pm Pilates  1:30pm SilverSneakers  2:30pm Line Dancing  4:30pm Pilates  5:30pm Power Sculpt  6:30pm Zumba  7:05pm Hearts @ Work  7:05pm Deep Water Workout   Fridays  5:35am X-Bike  8:00am Shape Up with Sarge  8:30am Power Sculpt  9:00am Shape up with Sarge  9:30 Pilates  10:00am Aqua Fusion  10:30am Young @ Heart  11:30am Line Dancing  12:15pm X-Bike  4:30pm Zumba   Saturdays  8:00am Yoga  9:00am Hearts @ Work  9:00am Zumba  10:00am Power Sculpt   . All exercise classes are included in your membership with the exception of [place]  . Morning Energizer: A low to moderate intensity shallow-water workout that will keep you loose and limber for the rest of the day. Participants will improve overall fitness using a variety of equipment and buoyancy techniques. Ideal for both beginner and advanced participants.  . Shape Up With Sarge: A well-rounded workout that incorporates stretching, balance strength and cardio exercises in a fast paced, fun routine to improve physical fitness. Equipment is used to increase water resistance.  Marland Kitchen Hearts @ Work: A moderate to high intensity water aerobic workout designed to increase cardio respiratory fitness. This shallow-water class uses range-of-motion stretches for improving flexibility. Water resistant exercises are used to develop muscle tone and strengthen core muscles.  Teresita Madura Yoga: This class incorporates a Kundulini style of yoga with water exercise that is great for everyone, especially those with limited spine, shoulder, and arm flexibility and people who experience limitations because of arthritis.  . Arthritis Water Class: A shallow water program of moderate water movements and gentle stretches that allow participants to strengthen muscles for increased endurance and reducing joint pain. Exercises designed to improve joint flexibility and balance.  . Deep  Water Workout: A no impact class providing high resistance for the total body. Flotation belts are provided to allow balancing the body in vertical or horizontal positions to maneuver in the water. Non-swimmers can participate with confidence as they develop and improve flexibility. Deep water provides a resistance environment for strengthening major muscles, the core muscles of the abs and back, and the arms and legs.  Teresita Madura Fusion: A fusion of challenging and creative movements that include high-intensity cardiovascular and muscle condition-ing for a full body workout. This class may include transitions from shallow to deep water, music and fun sets.    GROUP LAND CLASSES  . Core Training: A 30 minute class designed to condition core muscles and  strengthen your abs and back.  . Step: Includes a warm-up followed by a step segment (a low impact activity which involves stepping up and down on a platform while performing creative choreographic movements to music) followed by a cool-down, flexibility and some core strengthen training. Abs and relaxations will end the class.  . Pilates: This class is designed to improve core muscle strength, increase flexibility and improve overall health using mat work.  . Power Sculpt: This class is designed to offer a full upper and lower body workout. The class will incorporate a variety of equipment to build muscular strength and endurance, resulting in a leaner body.  . Athletic Core & Strength: This class incorporates strengthening moves in supine, prone and side lying positions to produce a strong and efficient core and incorporates abdominal and back stretches.  . Zumba: Burn major calories with this fun non-stop energetic workout. This class will offer the excitement of moving to the rhythm of Latin, funk and retro beats. Even if you're not a Tourist information centre manager, don't worry. The combos will be broken down into basic steps so you can jump right in with both feet.  Marquis Lunch  Boxing: Kicks, punches, blocks add up to a high intensity kick boxing class ending with a cool-down and stretching.  . Cardio Dance: Using simple dance steps to create routines that combine fast and slow rhythms for a Cardio Dance workout that will burn calories, tone and sculpt the body while having fun. No dance experience required!  Marland Kitchen T'ai Chi Ch'uan: Reduce stress and build self-confidence with this ancient martial arts system of exercise. The movements are practiced gently in slow motion to improve balance, strength and flexibility (Nominal Fee).  . SilverSneakers Muscular Strength and Range of Movement: Have fun and move to the music through a variety of exercises designed to increase muscular strength, range of movement, and activity for daily living skills. Hand-held weights, elastic tubing with handles, and a ball are offered for resistance, and a chair is used for seating and/or support.  Annamaria Boots at Heart: Make friends and have fun while you energize your body a you move to the music through a variety of exercises designed to increase flexibility, muscular strength, balance and cardiovascular conditioning.  . Cardio Dance Blast: This dynamic class still delivers plenty of cardio-there's no way you'll be standing still-so just get out there and move to the music. There is no "wrong" way in!  . Line Dancing: Grab a friend or two, get in line, and step in unison to popular tones and oldies. Great workout for all ages.  . Instructor's Choice: This class will be the scheduled instructors choice: options include, step, cardio dance, power sculpt, etc. Take a chance at learning something fun and new and attend this class.  . Yoga: This class focuses on breath control, simple meditation, and the adoption of specific standing postures to improve posture, strength, and flexibility.  Marland Kitchen HIIT: High Intensity Interval Training incorporates strength training with cardio bursts. Blending cardio and strength for  maximum benefit.  . Let's Dance -Join [place] as we learn a variety of dances including circle, swing, shag and even the [place] Two Step. The goal is to keep moving, get your heart rate up and have fun!

## 2014-10-19 ENCOUNTER — Other Ambulatory Visit: Payer: Self-pay | Admitting: *Deleted

## 2014-10-19 NOTE — Patient Outreach (Signed)
Telephone call received from pt. Pt stated reason for phone call as having trouble with constipation in the last week and wanted some education on appropriate things to eat and drink to relieve these symptoms.   RNCM educated pt on adding fiber to her diet and continuing with regular exercise to assist with bowel function. RNCM also discussed previous visit in which the pt stated she wanted to keep her BUN above 15 to stay off dialysis. RNCM discussed with pt that the actual lab name is GFR and this is the lab she is trying to keep above 15 to maintain kidney function high enough to stay off dialysis. Pt. Stated she remembered it was the GFR and she had gotten the two mixed up. RNCM informed pt the after visit summary printed for her had the incorrect lab name on it, so when she read it not to be confused. Pt stated she understood and would be aware.

## 2014-11-02 ENCOUNTER — Other Ambulatory Visit: Payer: Self-pay | Admitting: *Deleted

## 2014-11-02 NOTE — Patient Outreach (Signed)
RNCM spoke with pt related to pt's call into the nurse line during the night. Pt stated she had experienced restless leg pain after an extremely busy shift at work and was unable to rest during the night. Pt stated her arms were aching  too but denied chest pain, pressure, nausea, or being diaphoretic. She stated she was eventually able to go back to sleep and has had no more pain today. RNCM advised pt to call her primary care MD to discuss treatment options for restless leg syndrome. Pt stated she has an MD appt on Monday and will discuss this issue with MD at this time.  Rutherford Limerick RN, BSN  [facility] Care Management [phone])

## 2014-11-06 DIAGNOSIS — E559 Vitamin D deficiency, unspecified: Secondary | ICD-10-CM | POA: Diagnosis not present

## 2014-11-06 DIAGNOSIS — N186 End stage renal disease: Secondary | ICD-10-CM | POA: Diagnosis not present

## 2014-11-06 DIAGNOSIS — E785 Hyperlipidemia, unspecified: Secondary | ICD-10-CM | POA: Diagnosis not present

## 2014-11-06 DIAGNOSIS — I10 Essential (primary) hypertension: Secondary | ICD-10-CM | POA: Diagnosis not present

## 2014-11-06 DIAGNOSIS — D631 Anemia in chronic kidney disease: Secondary | ICD-10-CM | POA: Diagnosis not present

## 2014-11-06 DIAGNOSIS — Z01818 Encounter for other preprocedural examination: Secondary | ICD-10-CM | POA: Diagnosis not present

## 2014-11-06 DIAGNOSIS — N185 Chronic kidney disease, stage 5: Secondary | ICD-10-CM | POA: Diagnosis not present

## 2014-11-06 DIAGNOSIS — Q613 Polycystic kidney, unspecified: Secondary | ICD-10-CM | POA: Diagnosis not present

## 2014-11-06 DIAGNOSIS — N2581 Secondary hyperparathyroidism of renal origin: Secondary | ICD-10-CM | POA: Diagnosis not present

## 2014-11-08 ENCOUNTER — Other Ambulatory Visit: Payer: Self-pay | Admitting: *Deleted

## 2014-11-08 ENCOUNTER — Encounter: Payer: Self-pay | Admitting: *Deleted

## 2014-11-08 VITALS — BP 128/75 | HR 70 | Resp 18

## 2014-11-08 DIAGNOSIS — N189 Chronic kidney disease, unspecified: Secondary | ICD-10-CM

## 2014-11-09 ENCOUNTER — Ambulatory Visit
Admit: 2014-11-09 | Disposition: A | Discharge status: Home / Self Care | Payer: Self-pay | Attending: Vascular Surgery | Admitting: Vascular Surgery

## 2014-11-09 DIAGNOSIS — Z01818 Encounter for other preprocedural examination: Secondary | ICD-10-CM | POA: Diagnosis not present

## 2014-11-09 DIAGNOSIS — Z01812 Encounter for preprocedural laboratory examination: Secondary | ICD-10-CM | POA: Diagnosis not present

## 2014-11-09 DIAGNOSIS — N289 Disorder of kidney and ureter, unspecified: Secondary | ICD-10-CM | POA: Diagnosis not present

## 2014-11-09 DIAGNOSIS — I120 Hypertensive chronic kidney disease with stage 5 chronic kidney disease or end stage renal disease: Secondary | ICD-10-CM | POA: Diagnosis not present

## 2014-11-09 DIAGNOSIS — J9811 Atelectasis: Secondary | ICD-10-CM | POA: Diagnosis not present

## 2014-11-09 DIAGNOSIS — Z79899 Other long term (current) drug therapy: Secondary | ICD-10-CM | POA: Diagnosis not present

## 2014-11-09 DIAGNOSIS — I10 Essential (primary) hypertension: Secondary | ICD-10-CM | POA: Diagnosis not present

## 2014-11-09 DIAGNOSIS — R011 Cardiac murmur, unspecified: Secondary | ICD-10-CM | POA: Diagnosis not present

## 2014-11-09 LAB — BASIC METABOLIC PANEL
ANION GAP: 6 — AB (ref 7–16)
BUN: 51 mg/dL — ABNORMAL HIGH
CALCIUM: 8.9 mg/dL
CHLORIDE: 108 mmol/L
Co2: 25 mmol/L
Creatinine: 2.98 mg/dL — ABNORMAL HIGH
EGFR (African American): 18 — ABNORMAL LOW
GFR CALC NON AF AMER: 15 — AB
GLUCOSE: 100 mg/dL — AB
POTASSIUM: 4.7 mmol/L
SODIUM: 139 mmol/L

## 2014-11-09 LAB — PROTIME-INR
INR: 1
Prothrombin Time: 13.5 secs

## 2014-11-09 LAB — CBC
HCT: 31.9 % — ABNORMAL LOW (ref 35.0–47.0)
HGB: 10.3 g/dL — ABNORMAL LOW (ref 12.0–16.0)
MCH: 30.3 pg (ref 26.0–34.0)
MCHC: 32.2 g/dL (ref 32.0–36.0)
MCV: 94 fL (ref 80–100)
Platelet: 248 10*3/uL (ref 150–440)
RBC: 3.38 10*6/uL — ABNORMAL LOW (ref 3.80–5.20)
RDW: 13.5 % (ref 11.5–14.5)
WBC: 5 10*3/uL (ref 3.6–11.0)

## 2014-11-09 LAB — APTT: ACTIVATED PTT: 26.9 s (ref 23.6–35.9)

## 2014-11-10 DIAGNOSIS — N186 End stage renal disease: Secondary | ICD-10-CM | POA: Diagnosis not present

## 2014-11-10 DIAGNOSIS — Z992 Dependence on renal dialysis: Secondary | ICD-10-CM | POA: Diagnosis not present

## 2014-11-10 DIAGNOSIS — Z01810 Encounter for preprocedural cardiovascular examination: Secondary | ICD-10-CM | POA: Diagnosis not present

## 2014-11-10 NOTE — Patient Outreach (Signed)
[facility]) Care Management   11/10/2014  Joyce [name] 1945/09/03 [id number]  Joyce [name] is an 69 y.o. 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   Endo/Heme/Allergies: Positive for environmental allergies.  Psychiatric/Behavioral:       Pt takes regular medicine for Bipolar disorder, presently pleasant and denies depression or manic state.  All other systems reviewed and are negative.   Physical Exam  Nursing note and vitals reviewed. Constitutional: She is oriented to person, place, and time. She appears well-developed and well-nourished.  Cardiovascular: Normal rate, regular rhythm and normal heart sounds.   Respiratory: Effort normal and breath sounds normal.  GI: Soft. Bowel sounds are normal.  Earlier complaint of constipation this month, but pt stated symptom relieved by increasing fruit and fiber in her diet.   Neurological: She is alert and oriented to person, place, and time.  Skin: Skin is warm, dry and intact.  Psychiatric: Her speech is normal and behavior is normal. Judgment and thought content normal. Cognition and memory are normal.  Mood normal affect noted to be flat    Current Medications:   Current Outpatient Prescriptions  Medication Sig Dispense Refill  . acetaminophen (TYLENOL) 325 MG tablet Take 650 mg by mouth every 4 (four) hours as needed for mild pain.    . carbamazepine (TEGRETOL XR) 200 MG 12 hr tablet Take 200 mg by mouth 2 (two) times daily.    . cholecalciferol (VITAMIN D) 1000 UNITS tablet Take 1,000 Units by mouth daily.    . Cyanocobalamin (B-12  COMPLIANCE INJECTION IJ) Inject as directed every 30 (thirty) days.    Marland Kitchen lisinopril (PRINIVIL,ZESTRIL) 20 MG tablet Take 20 mg by mouth 2 (two) times daily.     Marland Kitchen lisinopril (PRINIVIL,ZESTRIL) 5 MG tablet Take 5 mg by mouth daily. This is the dose listed on the medication bottle    . Multiple Vitamin (MULTIVITAMIN) capsule Take 1 capsule by mouth daily.    . polyethylene glycol powder (GLYCOLAX/MIRALAX) powder 255 grams one bottle for colonoscopy prep (Patient not taking: Reported on 10/10/2014) 255 g 0  . QUEtiapine (SEROQUEL) 25 MG tablet Take 25 mg by mouth at bedtime. She takes two $RemoveB'25mg'[id number]$  tabs at bedtime    . valproic acid (DEPAKENE) 250 MG capsule Take 250 mg by mouth 3 (three) times daily.     . verapamil (CALAN-SR) 240 MG CR tablet Take 240 mg by mouth at bedtime.      No current facility-administered medications for this visit.    Functional Status:   In your present state of health, do you have any difficulty performing the following activities: 11/08/2014 10/09/2014  Hearing? N Y  Vision? N -  Difficulty concentrating or making decisions? N -  Walking or climbing stairs? N -  Dressing or bathing? N -  Doing errands, shopping? N -  Preparing Food and eating ? N -  Using the Toilet? N -  In the past six months, have you accidently leaked urine? N -  Do you have problems with loss of bowel control? N -  Managing your Medications? N -  Managing your Finances? N -  Housekeeping or managing your Housekeeping? N -    Fall/Depression Screening:    PHQ 2/9 Scores 10/09/2014  PHQ - 2 Score 0   Short term goals for care plan problem one met.  2 new short term goals set for problem two see care plan flow sheet for this encounter.  Assessment:   CKD: Alert and oriented x3. Flat affect noted but pt did smile on occasion during conversation. Skin warm and dry, bilateral feet with +2 pulses and skin intact. Pt able to obtain a home b/p monitor and is using to monitor her own b/p qday. Pt had  just two days prior went for pre-op visit with Dr. Arneta Cliche to plan and discuss fistula placement. Pt wanted more education so RNCM and pt used EMMI education video for further teaching. Questions answered as program was watched.  Other: Pt had previously called the [facility] nurse triage line with questions concerning restless leg syndrome and constipation. During this visit printed pt education on renal safe methods to relieve constipation provided and read over together. Also printed information on restless leg syndrome related to CKD and questions for the MD discussed to equip pt with a better knowledge base during her MD visit.   Plan:  Pt to continue monitoring and recording b/p daily. Also exercising 3 times per week.  Pt to call [employer] to inquire about freezer meals provided when she has surgery. RNCM to call pt on 4/27 to see if she has any unanswered questions prior to surgery on 4/29 RNCM to see pt in her home on 4/20.  Rutherford Limerick RN, BSN  [facility] Care Management [phone])

## 2014-11-11 NOTE — H&P (Signed)
PATIENT NAME:  Joyce [name], Joyce [name] MR#:  [id number] DATE OF BIRTH:  [birth year]  DATE OF ADMISSION:  12/17/2013  PRIMARY CARE PHYSICIAN: Dr. Cletis Athens.  REFERRING PHYSICIAN: Dr. Jimmye Norman.   CHIEF COMPLAINT: Dizziness, history of dizziness and passed out.   HISTORY OF PRESENT ILLNESS: Joyce [name] is a 69 year old female who comes to the Emergency Department with complaints of dizziness for the last 1 week. The dizziness is especially worse opening eyes, moving head in any direction. Denies having any weakness in any part of the body. Denies having any dysphagia. The patient is also found to have acute on chronic renal insufficiency. The patient's baseline creatinine of 2 trended up to. The patient states the room is spinning. CT head without contrast showed a low density in the left medulla. No obvious signs of infection are noted.   PAST MEDICAL HISTORY:  1. Chronic kidney disease. 2. Gastroesophageal reflux disease. 3. Bipolar disorder.  4. Migraine headaches.  5. Hypertension.   PAST SURGICAL HISTORY:  1. Hysterectomy.  2. Appendectomy.  3. Cholecystectomy.   ALLERGIES:  1. TYLENOL WITH CODEINE.  2. INDOCIN.   HOME MEDICATIONS:  1. Vitamin B 1200 units injectable every month.  2. Verapamil 240 mg once a day.  3. Valproic acid 250 mg 3 times a day.  4. Tylenol 325 mg 2 tablets every 4 hours as needed.  5. Lisinopril 20 mg once a day.   SOCIAL HISTORY: No history of smoking, drinking alcohol, or using illicit drugs. Lives by herself.   FAMILY HISTORY: Mother had an MI. She is still living.   REVIEW OF SYSTEMS:  CONSTITUTIONAL: Experiencing generalized weakness.  EYES: No change in vision.  ENT: The patient is hard of hearing. RESPIRATORY: No cough, shortness of breath. CARDIOVASCULAR: No chest pain, palpitations.  GASTROINTESTINAL: Has nausea, vomiting,  GENITOURINARY: No dysuria or hematuria.  HEMATOLOGIC: No easy bruising or bleeding.  SKIN: No rash or lesions.   MUSCULOSKELETAL: No joint pains and aches.   NEUROLOGIC: The patient is alert, oriented to place, person, and time. Cranial nerves II-XII intact. Motor 5/5 in upper and lower extremities. The patient has some nystagmus,.   PHYSICAL EXAMINATION: NEUROLOGIC: The patient does not have any nose-to-finger test, alternating movements.   EKG 12-LEAD: Normal sinus rhythm with no ST-T wave abnormalities.   LABORATORY DATA: CT head without contrast as mentioned above, questionable hypodense area in the medulla.  Troponins are negative. CMP: BUN 41, creatinine of 3.28. CBC is completely within normal limits.   ASSESSMENT AND PLAN: Joyce [name] is a 69 year old female who comes to the Emergency Department with 1 week of severe dizziness room spinning.  1. Dizziness. Concerning about patient's  lesion in the brain stem, will obtain MRA of the brain. Keep the patient n.p.o. for now. Will continue with MRA of the brain.  2. Hypertension: Currently well controlled.  3. Acute on chronic renal insufficiency, most likely secondary to prerenal. Also possibility of underlying, most likely secondary to dehydration. Continue with IV fluids and follow up.  4. Bipolar disorder. Continue the Depakote.  5. Hypertension: Hold the lisinopril for now.  6. Keep the patient on deep vein thrombosis prophylaxis with heparin.   TIME SPENT: 45 minutes.    ____________________________ Monica Becton, MD pv:lt D: 12/18/2013 01:47:51 ET T: 12/18/2013 05:14:43 ET JOB#: [id number]  cc: Monica Becton, MD, <Dictator> Cletis Athens, MD Monica Becton MD ELECTRONICALLY SIGNED 12/19/2013 0:49

## 2014-11-11 NOTE — Discharge Summary (Signed)
PATIENT NAME:  Joyce [name], Joyce [name] MR#:  [id number] DATE OF BIRTH:  12-01-1945  DATE OF ADMISSION:  05/10/2014 DATE OF DISCHARGE:  05/15/2014  CHIEF COMPLAINT AT THE TIME OF ADMISSION: Intractable nausea, vomiting, diarrhea,  abdominal pain.   ADMITTING DIAGNOSES:  1.  Acute gastroenteritis with generalized abdominal pain.  2.  Orthostatic hypotension from dehydration.   DISCHARGE DIAGNOSES:  1.  Acute gastroenteritis, probably viral, resolved.  2.  Acute drug-induced pancreatitis, discontinued valproate.  3.  Acute hypernatremia, resolved. 4.  Orthostatic hypotension, resolved.   PROCEDURES: None.   CONSULTATIONS: None.   BRIEF HISTORY AND PHYSICAL AND HOSPITAL COURSE: The patient is a 70 year old Caucasian female who came into the ED with chief complaint of intractable nausea, vomiting, and diarrhea associated with generalized abdominal pain for 2-3 weeks. The patient was given IV fluids, as she was feeling dizzy. Subsequently, she was admitted to the hospital with intractable nausea, vomiting, and diarrhea with a chief diagnosis of acute gastroenteritis with generalized abdominal pain.  During the hospital course, the patient was given IV fluids and stool tests were done. Clostridium difficile colitis was negative. Her diarrhea significantly improved, but still the patient was complaining of nausea and vomiting.   The patient's lipase was elevated and acute pancreatitis was diagnosed. The patient was kept n.p.o. for a while and IV fluids were continued and pain management was provided. The patient had a cholecystectomy and never drank alcohol. Triglycerides are not elevated. Her pancreatitis seemed to be from valproate-induced acute pancreatitis. Valproic acid was discontinued and subsequently the patient's clinical situation significantly improved. The patient was recommended to follow up with her psychiatry as scheduled on Wednesday October 28. Now her lipase is trending down and the patient's  diet was advanced. Today her clinical condition is significantly better. The patient started ambulating in the hallway and abdominal pain is completely resolved.   Hypernatremia. Initially, the patient has received IV normal saline regarding acute pancreatitis and acute gastroenteritis. In view of hypernatremia, IV normal saline was discontinued and patient was provided with half normal saline with little improvement. Yesterday, she was given D5 water and hypernatremia is completely resolved.  Acute kidney injury is resolved, which was prerenal and from the GI losses. ACE inhibitor, which was held during the hospital course, was resumed. Overall, her condition was improved.  For acute cystitis, urine cultures were sent. Urine culture has revealed Escherichia coli which is pansensitive. The plan is to continue ciprofloxacin, which was given during the hospital course as well. LFTs are trending down and condition is clinically stable.  SIGNIFICANT LABS AND IMAGING STUDIES: Glucose is 118, creatinine 2.33, BUN is 9. Her creatinine is almost at her baseline. Sodium, potassium are normal. Lipase, which was at 1210, trended down to 407 today. LFTs: Normal except albumin at 2.7. Urine culture: 50,000 colonies of Escherichia coli which are pansensitive. Stool for Clostridium difficile toxin is negative.  MEDICATIONS AT THE TIME OF DISCHARGE: Tylenol 325 mg 2 tablets every 4 hours as needed for pain; verapamil 240 mg 1 capsule p.o. once a day at bedtime; vitamin B12, 1000 mcg 1 mL injectable once a month; lisinopril 5 mg once daily; promethazine 25 mg 1 tablet p.o. every 6 hours as needed for nausea and vomiting; ranitidine 150 mg p.o. 2 times a day; dicyclomine 20 mg 1 tablet p.o. every 8 hours as needed for diarrhea; Cipro 250 mg 1 tablet p.o. every 12 hours; simethicone 80 mg chewable tablets 1 tablet every 4 hours as needed for  bloating.   ACTIVITY: As tolerated.   DIET: Low sodium.  FOLLOWUP: Follow up  with primary care physician in 1 to 2 weeks and Dr. Thurmond Butts, psychiatry, as scheduled on Wednesday. Dr. Thurmond Butts has to consider another medication regarding her bipolar disorder, as valproate is discontinued secondary to drug-induced pancreatitis. The plan of care was discussed in detail with the patient. She verbalized understanding of the plan.  TOTAL TIME SPENT ON THE DISCHARGE: 45 minutes.   ____________________________ Nicholes Mango, MD ag:ST D: 05/15/2014 15:04:52 ET T: 05/15/2014 23:36:24 ET JOB#: [id number]  cc: Nicholes Mango, MD, <Dictator> Cletis Athens, MD Dr. Aleen Sells MD ELECTRONICALLY SIGNED 05/29/2014 21:09

## 2014-11-11 NOTE — H&P (Signed)
PATIENT NAME:  Joyce [name], Joyce [name] MR#:  [id number] DATE OF BIRTH:  [birth year]  PRIMARY CARE PHYSICIAN:  Cletis Athens, MD  CHIEF COMPLAINT:  Intractable nausea, vomiting, diarrhea, and abdominal pain for the past 2-3 weeks.   HISTORY OF PRESENT ILLNESS: The patient is a 69 year old Caucasian female who is presenting to the ED with a chief complaint of intractable nausea, vomiting, and diarrhea associated with generalized abdominal pain for the past 2-3 weeks. As it is not getting better, and the patient was feeling weak and dizzy, she came into the ED. The patient was given 2 liters of IV fluids with no significant improvement. The patient was orthostatic and still feeling dizzy, so hospitalist team is called to admit the patient.   The patient was concerned as she was having abdominal pain and nausea, vomiting, and diarrhea. No blood in her stool or vomit. She has chronic renal insufficiency and see Dr. Holley Raring as an outpatient regarding renal insufficiency.   During my examination, the patient is still feeling dry. Denies any chest pain or shortness of breath, abdominal pain is slightly better as pain medication was given in the ED. Denies any fevers. No sick contacts. No recent use of antibiotics.   PAST MEDICAL HISTORY: History of chronic renal insufficiency, GERD, hypertension, bipolar disorder, migraines.   PAST SURGICAL HISTORY: Hysterectomy, appendectomy, cholecystectomy.   ALLERGIES: TYLENOL WITH CODEINE, INDOCIN.   PSYCHOSOCIAL HISTORY: Lives at home, lives alone. No smoking, alcohol, or illicit drug usage.   FAMILY HISTORY: Mother had MI and she is still living.  HOME MEDICATIONS: vitamin B12 injectable 1 mL once a month, verapamil 240 mg 1 tablet p.o. once daily, valproic acid 250 mg 2 capsules p.o. once daily,  promethazine 25 mg p.o. q. 6 hours, lisinopril 5 mg p.o. once a daily, dicyclomine 20 mg p.o. 8 hours.   REVIEW OF SYSTEMS:  CONSTITUTIONAL:  Denies any fever. Complaining of  fatigue and weakness.  EYES: Denies blurry vision, double vision, glaucoma.  EARS, NOSE, THROAT: Denies any epistaxis or discharge.  RESPIRATORY:  Denies cough, COPD.   CARDIOVASCULAR: No chest pain, palpitations. GASTROINTESTINAL: Complaining of nausea, vomiting, diarrhea, abdominal pain. Denies any hematemesis or melena or jaundice. Has chronic history of GERD. GENITOURINARY: No dysuria or hematuria.  GYNECOLOGIC AND BREASTS: Denies breast mass or vaginal discharge.  ENDOCRINE: Denies polyuria, nocturia, thyroid problems. HEMATOLOGIC/LYMPHATICS:  No anemia, easy bruising, bleeding.  INTEGUMENTARY: No acne, rash, lesions.  MUSCULOSKELETAL: No joint pain in the neck and back.  NEUROLOGIC:  Denies any  vertigo, ataxia.  PSYCHIATRIC: No ADD, no OCD. Has bipolar disorder.  PHYSICAL EXAMINATION: VITAL SIGNS: Temperature 97.4, pulse 80, respirations 18, blood pressure 130/69, pulse oximetry is 97%. Orthostatic blood pressure, lying 144/84 with pulse 90; standing 114/54 with a pulse 96.  After IV fluids 2 liters repeat orthostatic blood pressure 127/51 with pulse 82; standing 115/103, with pulse 115.  GENERAL APPEARANCE: Not in any acute distress, looks weak and tired.  HEENT: Normocephalic, atraumatic. Pupils are equally reacting to light and accommodation. No scleral icterus. No conjunctival injection. No sinus tenderness. Dry mucous membranes.  NECK: Supple. No JVD. No thyromegaly. . Range of motion is intact. LUNGS: Clear to auscultation bilaterally. No accessory muscle use and no anterior chest wall tenderness on palpation.  CARDIAC: S1 and S2 normal. Regular rate and rhythm. No murmurs. GASTROINTESTINAL: Soft. Bowel sounds are positive in all 4 quadrants. Generalized tenderness is present. No rebound tenderness.  No masses felt.  NEUROLOGIC: Awake, alert, and oriented  x 3. Cranial nerves II through XII are grossly intact. Motor and sensory are intact. Reflexes are 2+.  EXTREMITIES: No edema.  No cyanosis. No clubbing.  SKIN: Warm to touch. Decreased turgor from dehydration. No rashes. No lesions.  MUSCULOSKELETAL: No joint effusion, tenderness, erythema.  PSYCHIATRIC: Flat mood and affect.   LABORATORY AND IMAGING STUDIES:  Urinalysis yellow in color, clear in appearance. Nitrites are negative, leukocyte esterase negative. WBCs present. WBC 6.3, hemoglobin 11.1,  hematocrit 33.4, platelets are 239,000. Lipase is elevated at 412. Chem-8, glucose 110, BUN 21, creatinine 2.64. On October 14 creatinine was at 3.47. Sodium is normal, potassium 3.4, chloride 116, CO2 of 20, GFR 19, anion gap 9, serum osmolality normal. Calcium at 8.3. LFTs, total protein 6.0, albumin 3.0. The rest of the LFTs are normal.  CAT scan of the abdomen and pelvis is ordered, which is pending.  A 12-lead EKG, normal sinus rhythm at 62 beats per minute, normal PR and QRS interval. No acute ST wave changes.   ASSESSMENT AND PLAN: A 69 year old Caucasian female presented to the Emergency Department with a chief complaint of 2-3 week history of intractable nausea, vomiting, diarrhea, generalized abdominal pain, with no fever, but complaining of dizziness. No loss of consciousness.  Will be admitted with the following assessment and plan:  1.  Acute gastroenteritis with acute generalized abdominal pain. Etiology can be viral versus bacterial. Will admit her to medical floor. We will provide intravenous fluids and gastrointestinal prophylaxis. Pain management will be provided for generalized abdominal pain. The patient will be on clear liquid diet and advanced as tolerated.  2.  CAT scan of the abdomen and pelvis is ordered, which is pending at this time.  3.  With check stool tests and the patient will be on enteric precautions while Clostridium difficile tests are pending.  4.  Will start her on empiric antibiotics Flagyl and Levaquin as the patient has been having more than 2-3 week history of vomiting and diarrhea.  5.   Orthostatic hypotension from dehydration with acute nausea and vomiting and diarrhea. Will provide IV fluids. 6.  Chronic renal insufficiency, seemed to be at baseline. Follow up with nephrology as an outpatient as recommended.  7.  Gastroesophageal reflux disease. Will provide her gastrointestinal prophylaxis.  8.  Hypertension. We will hold off on antihypertensive medications as the patient is currently with intractable nausea and vomiting and hypotension.  19. Migraine. Denies any at this point.  20.  Will provide gastrointestinal prophylaxis and deep vein thrombosis prophylaxis with heparin subcutaneous.   CODE STATUS: She is full code. Sister and son are the medical power of attorney. Plan of care discussed in detail with the patient. She is aware of the plan. Total time spent is 50 minutes.   ____________________________ Nicholes Mango, MD ag:LT D: 05/10/2014 [id number] ET T: 05/10/2014 20:12:53 ET JOB#: [id number]  cc: Nicholes Mango, MD, <Dictator> Cletis Athens, MD Nicholes Mango MD ELECTRONICALLY SIGNED 06/07/2014 14:03

## 2014-11-11 NOTE — Discharge Summary (Signed)
PATIENT NAME:  Joyce [name], Joyce [name] MR#:  [id number] DATE OF BIRTH:  12-Jun-1946  DATE OF ADMISSION:  12/17/2013  DATE OF DISCHARGE:  12/18/2013  ADMISSION DIAGNOSIS: Dizziness.   DISCHARGE DIAGNOSES: 1.  Dizziness.  2.  Acute renal failure.   PERTINENT LABORATORIES AT DISCHARGE: White blood cells 5.2, hemoglobin 10, hematocrit 30,  Sodium 143, potassium 4.4, chloride 117, bicarb 23, BUN 39, creatinine 3, glucose 78. Troponins were negative.   HOSPITAL COURSE:  A 69 year old female who presented with dizziness. For further details, please refer to the H and P.  1. Dizziness. The patient's dizziness had resolved. It is likely an inner ear problem. She underwent an MRI. We do recommend an ENT consultation if the dizziness persists.  2.  Acute renal failure. Her baseline is 2.0. This is likely from prerenal azotemia and ATN from ACE inhibitor. We are holding ACE inhibitor. She needs to follow up with her PCP, she plans to see Dr. Lavera Guise tomorrow. 3. Hypertension. Holding lisinopril. She can continue verapamil, and she will be seeing Dr. Lavera Guise tomorrow.  4.  Bipolar affective disorder, which is stable.   DISCHARGE MEDICATIONS: 1.  Valproic acid 3 times a day, 1 tablet in the morning and 2 tablets at night.  2.  Verapamil 240 mg daily.  3.  Vitamin B12, 1000 mcg monthly.  4.  Tylenol 325 mg 2 tablets q. 4 hours p.r.n. pain.   INSTRUCTIONS:  Discharge diet: Low sodium. Discharge activity: As tolerated. Discharge follow up: The patient will follow up tomorrow with Dr. Lavera Guise for kidney function tests and blood pressure.   TIME SPENT:  35 minutes.    ____________________________ Donell Beers. Benjie Karvonen, MD spm:mr D: 12/18/2013 11:37:00 ET T: 12/18/2013 17:05:29 ET JOB#: [id number]  cc: Corona Popovich P. Benjie Karvonen, MD, <Dictator> Cletis Athens, MD  Donell Beers Almas Rake MD ELECTRONICALLY SIGNED 12/19/2013 7:25

## 2014-11-12 NOTE — Op Note (Signed)
PATIENT NAME:  Joyce [name], Joyce [name] MR#:  [id number] DATE OF BIRTH:  [birth year]  DATE OF PROCEDURE:  12/30/2011  PREOPERATIVE DIAGNOSIS: Visually significant cataract of the right eye.   POSTOPERATIVE DIAGNOSIS: Visually significant cataract of the right eye.   OPERATIVE PROCEDURE: Cataract extraction by phacoemulsification with implant of intraocular lens to right eye.   SURGEON: Birder Robson, MD.   ANESTHESIA:  1. Managed anesthesia care.  2. Topical tetracaine drops followed by 2% Xylocaine jelly applied in the preoperative holding area.   COMPLICATIONS: None.   TECHNIQUE:  Four quadrant divide and conquer.   DESCRIPTION OF PROCEDURE: The patient was examined and consented in the preoperative holding area where the aforementioned topical anesthesia was applied to the right eye and then brought back to the Operating Room where the right eye was prepped and draped in the usual sterile ophthalmic fashion and a lid speculum was placed. A paracentesis was created with the side port blade and the anterior chamber was filled with viscoelastic. A near clear corneal incision was performed with the steel keratome. A continuous curvilinear capsulorrhexis was performed with a cystotome followed by the capsulorrhexis forceps. Hydrodissection and hydrodelineation were carried out with BSS on a blunt cannula. The lens was removed in a four quadrant divide and conquer technique and the remaining cortical material was removed with the irrigation-aspiration handpiece. The capsular bag was inflated with viscoelastic and the Tecnis ZCB00 9.5-diopter lens, serial number [id number] was placed in the capsular bag without complication. The remaining viscoelastic was removed from the eye with the irrigation-aspiration handpiece. The wounds were hydrated. The anterior chamber was flushed with Miostat and the eye was inflated to physiologic pressure. The wounds were found to be water tight. The eye was dressed with Vigamox.  The patient was given protective glasses to wear throughout the day and a shield with which to sleep tonight. The patient was also given drops with which to begin a drop regimen today and will follow-up with me in one day.  ____________________________ Livingston Diones. Rakiyah Esch, MD wlp:slb D: 12/30/2011 12:35:03 ET T: 12/30/2011 13:09:16 ET JOB#: [id number]  cc: Renna Kilmer L. Victoriana Aziz, MD, <Dictator> Livingston Diones Amrutha Avera MD ELECTRONICALLY SIGNED 01/01/2012 12:12

## 2014-11-12 NOTE — Op Note (Signed)
PATIENT NAME:  Joyce [name], Joyce [name] MR#:  [id number] DATE OF BIRTH:  June 29, 1946  DATE OF PROCEDURE:  12/16/2011  PREOPERATIVE DIAGNOSIS: Visually significant cataract of the left eye.   POSTOPERATIVE DIAGNOSIS: Visually significant cataract of the left eye.   OPERATIVE PROCEDURE: Cataract extraction by phacoemulsification with implant of intraocular lens to left eye.   SURGEON: Birder Robson, MD.   ANESTHESIA:  1. Managed anesthesia care.  2. Topical tetracaine drops followed by 2% Xylocaine jelly applied in the preoperative holding area.   COMPLICATIONS: None.   TECHNIQUE:  Stop and chop.   DESCRIPTION OF PROCEDURE: The patient was examined and consented in the preoperative holding area where the aforementioned topical anesthesia was applied to the left eye and then brought back to the Operating Room where the left eye was prepped and draped in the usual sterile ophthalmic fashion and a lid speculum was placed. A paracentesis was created with the side port blade and the anterior chamber was filled with viscoelastic. A near clear corneal incision was performed with the steel keratome. A continuous curvilinear capsulorrhexis was performed with a cystotome followed by the capsulorrhexis forceps. Hydrodissection and hydrodelineation were carried out with BSS on a blunt cannula. The lens was removed in a stop and chop technique and the remaining cortical material was removed with the irrigation-aspiration handpiece. The capsular bag was inflated with viscoelastic and the Tecnis ZCB00 8.5-diopter lens, serial number [id number] was placed in the capsular bag without complication. The remaining viscoelastic was removed from the eye with the irrigation-aspiration handpiece. The wounds were hydrated. The anterior chamber was flushed with Miostat and the eye was inflated to physiologic pressure. The wounds were found to be water tight. The eye was dressed with Vigamox. The patient was given protective glasses  to wear throughout the day and a shield with which to sleep tonight. The patient was also given drops with which to begin a drop regimen today and will follow-up with me in one day.    ____________________________ Livingston Diones. Letina Luckett, MD wlp:ap D: 12/16/2011 13:08:45 ET T: 12/16/2011 13:52:48 ET JOB#: [id number]  cc: Nikolos Billig L. [name], MD, <Dictator> Livingston Diones Demarco Bacci MD ELECTRONICALLY SIGNED 12/17/2011 9:35

## 2014-11-13 DIAGNOSIS — R5383 Other fatigue: Secondary | ICD-10-CM | POA: Diagnosis not present

## 2014-11-16 ENCOUNTER — Other Ambulatory Visit: Payer: Self-pay | Admitting: *Deleted

## 2014-11-16 NOTE — Patient Outreach (Signed)
Talked with pt about upcoming surgery. Pt noted surgery scheduled for 6am tomorrow am.   Plan: Follow up with pt after surgery to see if she has any questions.   Rutherford Limerick RN, BSN  [facility] Care Management [phone])

## 2014-11-17 ENCOUNTER — Ambulatory Visit
Admit: 2014-11-17 | Disposition: A | Discharge status: Home / Self Care | Payer: Self-pay | Attending: Vascular Surgery | Admitting: Vascular Surgery

## 2014-11-17 DIAGNOSIS — N185 Chronic kidney disease, stage 5: Secondary | ICD-10-CM | POA: Diagnosis not present

## 2014-11-17 DIAGNOSIS — F419 Anxiety disorder, unspecified: Secondary | ICD-10-CM | POA: Diagnosis not present

## 2014-11-17 DIAGNOSIS — Z9842 Cataract extraction status, left eye: Secondary | ICD-10-CM | POA: Diagnosis not present

## 2014-11-17 DIAGNOSIS — I499 Cardiac arrhythmia, unspecified: Secondary | ICD-10-CM | POA: Diagnosis not present

## 2014-11-17 DIAGNOSIS — N289 Disorder of kidney and ureter, unspecified: Secondary | ICD-10-CM | POA: Diagnosis not present

## 2014-11-17 DIAGNOSIS — D649 Anemia, unspecified: Secondary | ICD-10-CM | POA: Diagnosis not present

## 2014-11-17 DIAGNOSIS — N184 Chronic kidney disease, stage 4 (severe): Secondary | ICD-10-CM | POA: Diagnosis not present

## 2014-11-17 DIAGNOSIS — I129 Hypertensive chronic kidney disease with stage 1 through stage 4 chronic kidney disease, or unspecified chronic kidney disease: Secondary | ICD-10-CM | POA: Diagnosis not present

## 2014-11-17 DIAGNOSIS — Z9841 Cataract extraction status, right eye: Secondary | ICD-10-CM | POA: Diagnosis not present

## 2014-11-17 DIAGNOSIS — Z9049 Acquired absence of other specified parts of digestive tract: Secondary | ICD-10-CM | POA: Diagnosis not present

## 2014-11-17 DIAGNOSIS — F319 Bipolar disorder, unspecified: Secondary | ICD-10-CM | POA: Diagnosis not present

## 2014-11-19 NOTE — Op Note (Signed)
PATIENT NAME:  Joyce [name], Joyce [name] MR#:  [id number] DATE OF BIRTH:  Apr 28, 1946  DATE OF PROCEDURE:  11/17/2014  PREOPERATIVE DIAGNOSES: Stage IV renal insufficiency.   POSTOPERATIVE DIAGNOSIS: Stage IV renal insufficiency.   PROCEDURE PERFORMED: Creation of a left brachiocephalic fistula.   SURGEON: Katha Cabal, MD   ANESTHESIA: General by LMA.   FLUIDS: Per anesthesia record.   ESTIMATED BLOOD LOSS: Minimal.   SPECIMEN: None.   INDICATIONS: Ms. Mapa is a 69 year old woman who was referred to the office for evaluation of fistula creation given her advanced renal insufficiency. Vein mapping did not identify a cephalic vein which was adequate, but did show a large basilic vein, and the plan was for basilic transposition. However, upon induction of general anesthesia, a very large cephalic vein was identified and therefore, I elected to explore this and once I confirmed it was a patent cephalic vein, I opted to perform a brachiocephalic fistula.   DESCRIPTION OF PROCEDURE: The patient is taken to the operating room and placed in the supine position. After adequate general anesthesia is induced and appropriate invasive monitors are placed, she was positioned supine and positioned with her left arm extended palm upward. The left arm is prepped and draped in a sterile fashion. Appropriate timeout is called.   As noted above, once anesthesia had been induced, a very large cephalic vein is noted. A small transverse incision is created in the antecubital fossa over the vein, and the vein itself is explored. It does appear to be a good vein. Ultimately, the dissection is carried down distally to obtain adequate length, and then it is ligated and divided with 3-0 silk tie. Using [facility] coronary dilators, a 3.5 mm dilator advances quite easily through the entire vein, all the way up to the hub, which is past the midline of the biceps. The vein is then irrigated quite nicely and flushes quite easily.  Given these findings, I elected to move toward with a brachiocephalic fistula, noting that this would be a much easier access for her and that should there be some sort of maturation problem, then this would certainly not interfere, in the future, with a basilic transposition.   The incision is then extended in a curvilinear fashion, and the brachial artery is exposed. It is looped proximally and distally with Silastic vessel loops. The cephalic vein is then skeletonized more proximally, until adequate length has been freed,   The brachial artery is controlled with vessel loops. Arteriotomy is made, extended with Potts scissors, and then indwelling stay sutures are placed, both medially and laterally. The vein is then irrigated and approximated in an end vein to side brachial artery anastomosis using interrupted 6-0 Prolene. A total of 8 sutures are utilized and flushing maneuvers were performed. One area is noted to be bleeding and a single interrupted suture is used to control this. Adequate hemostasis is noted with an excellent thrill. The vein is easily palpated up to the proximal biceps. Excellent thrill is noted.   The wound is then irrigated, Surgicel is placed around the suture line and interrupted 3-0 Vicryl are used to close the incision, followed by 4-0 Monocryl subcuticular for skin, and then Dermabond. The patient tolerated the procedure well. There were no immediate complications. Sponge and needle counts were correct, and she is taken to the recovery area in excellent condition.     ____________________________ Katha Cabal, MD ggs:mw D: 11/17/2014 10:20:02 ET T: 11/17/2014 11:16:26 ET JOB#: [id number]  cc: Dolores Lory.  Delana Meyer, MD, <Dictator> Katha Cabal MD ELECTRONICALLY SIGNED 11/17/2014 13:06

## 2014-11-24 DIAGNOSIS — F3177 Bipolar disorder, in partial remission, most recent episode mixed: Secondary | ICD-10-CM | POA: Diagnosis not present

## 2014-12-08 ENCOUNTER — Other Ambulatory Visit: Payer: Self-pay | Admitting: *Deleted

## 2014-12-08 NOTE — Patient Outreach (Signed)
Pt called RNCM to say she could not see RNCM this week as planned for Friday. Pt works at [employer] and only gets her schedule a few days in advance.   Plan: Pt will call RNCM on Monday to reschedule home visit.   Rutherford Limerick RN, BSN  [facility] Care Management [phone])

## 2014-12-12 DIAGNOSIS — M797 Fibromyalgia: Secondary | ICD-10-CM | POA: Diagnosis not present

## 2014-12-12 DIAGNOSIS — R1110 Vomiting, unspecified: Secondary | ICD-10-CM | POA: Diagnosis not present

## 2014-12-12 DIAGNOSIS — N182 Chronic kidney disease, stage 2 (mild): Secondary | ICD-10-CM | POA: Diagnosis not present

## 2014-12-12 DIAGNOSIS — R5382 Chronic fatigue, unspecified: Secondary | ICD-10-CM | POA: Diagnosis not present

## 2014-12-19 ENCOUNTER — Other Ambulatory Visit: Payer: Self-pay | Admitting: *Deleted

## 2014-12-19 NOTE — Patient Outreach (Signed)
RNCM left phone message to reschedule previously cancelled home visit.   Plan: Cal pt in 1 day.  Rutherford Limerick RN, BSN  [facility] Care Management [phone])

## 2014-12-20 DIAGNOSIS — Z4889 Encounter for other specified surgical aftercare: Secondary | ICD-10-CM | POA: Diagnosis not present

## 2014-12-20 DIAGNOSIS — N289 Disorder of kidney and ureter, unspecified: Secondary | ICD-10-CM | POA: Diagnosis not present

## 2014-12-20 DIAGNOSIS — I10 Essential (primary) hypertension: Secondary | ICD-10-CM | POA: Diagnosis not present

## 2014-12-20 DIAGNOSIS — N185 Chronic kidney disease, stage 5: Secondary | ICD-10-CM | POA: Diagnosis not present

## 2014-12-25 DIAGNOSIS — N184 Chronic kidney disease, stage 4 (severe): Secondary | ICD-10-CM | POA: Diagnosis not present

## 2014-12-25 DIAGNOSIS — I10 Essential (primary) hypertension: Secondary | ICD-10-CM | POA: Diagnosis not present

## 2014-12-25 DIAGNOSIS — N2581 Secondary hyperparathyroidism of renal origin: Secondary | ICD-10-CM | POA: Diagnosis not present

## 2014-12-25 DIAGNOSIS — D631 Anemia in chronic kidney disease: Secondary | ICD-10-CM | POA: Diagnosis not present

## 2014-12-25 DIAGNOSIS — Q613 Polycystic kidney, unspecified: Secondary | ICD-10-CM | POA: Diagnosis not present

## 2014-12-25 DIAGNOSIS — I129 Hypertensive chronic kidney disease with stage 1 through stage 4 chronic kidney disease, or unspecified chronic kidney disease: Secondary | ICD-10-CM | POA: Diagnosis not present

## 2014-12-27 ENCOUNTER — Telehealth: Payer: Self-pay | Admitting: Urgent Care

## 2014-12-27 ENCOUNTER — Ambulatory Visit
Admission: RE | Admit: 2014-12-27 | Discharge: 2014-12-27 | Disposition: A | Discharge status: Home / Self Care | Payer: Commercial Managed Care - HMO | Source: Ambulatory Visit | Attending: Urgent Care | Admitting: Urgent Care

## 2014-12-27 ENCOUNTER — Other Ambulatory Visit: Payer: Self-pay | Admitting: *Deleted

## 2014-12-27 ENCOUNTER — Ambulatory Visit: Payer: Commercial Managed Care - HMO | Admitting: *Deleted

## 2014-12-27 ENCOUNTER — Ambulatory Visit (INDEPENDENT_AMBULATORY_CARE_PROVIDER_SITE_OTHER): Payer: Commercial Managed Care - HMO | Admitting: Urgent Care

## 2014-12-27 ENCOUNTER — Encounter: Payer: Self-pay | Admitting: Urgent Care

## 2014-12-27 VITALS — BP 134/77 | HR 75 | Temp 98.3°F | Ht 64.0 in | Wt 165.6 lb

## 2014-12-27 DIAGNOSIS — K5900 Constipation, unspecified: Secondary | ICD-10-CM

## 2014-12-27 DIAGNOSIS — R112 Nausea with vomiting, unspecified: Secondary | ICD-10-CM | POA: Insufficient documentation

## 2014-12-27 DIAGNOSIS — R109 Unspecified abdominal pain: Secondary | ICD-10-CM | POA: Diagnosis not present

## 2014-12-27 DIAGNOSIS — R1110 Vomiting, unspecified: Secondary | ICD-10-CM | POA: Diagnosis not present

## 2014-12-27 DIAGNOSIS — K5909 Other constipation: Secondary | ICD-10-CM

## 2014-12-27 LAB — CBC WITH DIFFERENTIAL/PLATELET
Basophils Absolute: 0 10*3/uL (ref 0–0.1)
Basophils Relative: 1 %
Eosinophils Absolute: 0.1 10*3/uL (ref 0–0.7)
Eosinophils Relative: 1 %
HEMATOCRIT: 33.5 % — AB (ref 35.0–47.0)
Hemoglobin: 11.1 g/dL — ABNORMAL LOW (ref 12.0–16.0)
LYMPHS ABS: 1.4 10*3/uL (ref 1.0–3.6)
Lymphocytes Relative: 23 %
MCH: 31.3 pg (ref 26.0–34.0)
MCHC: 33.2 g/dL (ref 32.0–36.0)
MCV: 94.3 fL (ref 80.0–100.0)
MONO ABS: 0.3 10*3/uL (ref 0.2–0.9)
Monocytes Relative: 6 %
NEUTROS ABS: 4.2 10*3/uL (ref 1.4–6.5)
Neutrophils Relative %: 69 %
Platelets: 296 10*3/uL (ref 150–440)
RBC: 3.56 MIL/uL — AB (ref 3.80–5.20)
RDW: 13.2 % (ref 11.5–14.5)
WBC: 6.1 10*3/uL (ref 3.6–11.0)

## 2014-12-27 LAB — COMPREHENSIVE METABOLIC PANEL
ALK PHOS: 55 U/L (ref 38–126)
ALT: 14 U/L (ref 14–54)
AST: 16 U/L (ref 15–41)
Albumin: 4.1 g/dL (ref 3.5–5.0)
Anion gap: 9 (ref 5–15)
BUN: 59 mg/dL — ABNORMAL HIGH (ref 6–20)
CO2: 23 mmol/L (ref 22–32)
CREATININE: 3.15 mg/dL — AB (ref 0.44–1.00)
Calcium: 9 mg/dL (ref 8.9–10.3)
Chloride: 104 mmol/L (ref 101–111)
GFR calc Af Amer: 16 mL/min — ABNORMAL LOW (ref >60)
GFR, EST NON AFRICAN AMERICAN: 14 mL/min — AB (ref >60)
GLUCOSE: 114 mg/dL — AB (ref 65–99)
Potassium: 4.5 mmol/L (ref 3.5–5.1)
Sodium: 136 mmol/L (ref 135–145)
Total Bilirubin: 0.5 mg/dL (ref 0.3–1.2)
Total Protein: 7 g/dL (ref 6.5–8.1)

## 2014-12-27 LAB — URINALYSIS COMPLETE WITH MICROSCOPIC (ARMC ONLY)
Bacteria, UA: NONE SEEN
Bilirubin Urine: NEGATIVE
Glucose, UA: NEGATIVE mg/dL
Hgb urine dipstick: NEGATIVE
KETONES UR: NEGATIVE mg/dL
Leukocytes, UA: NEGATIVE
Nitrite: NEGATIVE
PROTEIN: NEGATIVE mg/dL
SQUAMOUS EPITHELIAL / LPF: NONE SEEN
Specific Gravity, Urine: 1.006 (ref 1.005–1.030)
pH: 5 (ref 5.0–8.0)

## 2014-12-27 LAB — LIPASE, BLOOD: Lipase: 72 U/L — ABNORMAL HIGH (ref 22–51)

## 2014-12-27 MED ORDER — POLYETHYLENE GLYCOL POWD: 17.0000 g | Freq: Every evening | Status: DC

## 2014-12-27 NOTE — Patient Instructions (Signed)
Please get your labs as soon as possible.  We will send a letter with results. Go to ER if unable to keep down fluids, severe pain or vomiting.  MiraLAX 17 g daily constipation KUB today

## 2014-12-27 NOTE — Assessment & Plan Note (Signed)
See constipation

## 2014-12-27 NOTE — Progress Notes (Signed)
Gastroenterology Consultation  Referring Provider:     Cletis Athens, MD Primary Care Physician:  Cletis Athens, MD Primary Gastroenterologist:  Dr. Allen Norris     Reason for Consultation:     N/V/Constipation/Abd pain        HPI:   Joyce [name] is a 69 y.o. y/o female referred for consultation & management of abdominal pain, nausea, vomiting & constipation by Cletis Athens, MD.  Pt states April 30 she had a fistula placed to left [facility].  She went back to work a week later.  She has been getting nausea & vomiting ever since she went back to work.  She has severe pain left-side of her abdomen, then she had a large BM that was as big as her fist.  She had diaphoresis & night sweats.  She denies urinary symptoms.  She has vomited twice.  Pain is on left side, sharp & has not let up.  10/10 at worst.  Denies rectal bleeding, melena or mucus in stools.  She is having BMs once every 3 days or so.   She started seroquel in January.  She started priolsec 20mg  daily 3 weeks ago with little help.  She has constant belching.  Denies dysphagia or odynophagia.  She has no plans to start dialysis yet.  Hx pancreatitis Oct 2015 compared to Depakote which has since been discontinued.   Past Medical History  Diagnosis Date  . Colon polyps   . Diverticulitis   . CKD (chronic kidney disease)     Dr Holley Raring [name]  . Diverticulitis   . Anal fissure   . Vertigo   . Pancreatitis     Valproic acid  . Bipolar affective disorder     Past Surgical History  Procedure Laterality Date  . Cholecystectomy  2003  . Eye surgery    . Colonoscopy  2014    Dr. Jamal Collin  . Abdominal hysterectomy  1990 ?  Marland Kitchen Av fistula placement  4/30    Prior to Admission medications   Medication Sig Start Date End Date Taking? Authorizing Provider  acetaminophen (TYLENOL) 325 MG tablet Take 650 mg by mouth every 4 (four) hours as needed for mild pain.   Yes Historical Provider, MD  carbamazepine (TEGRETOL XR) 200 MG 12 hr tablet Take  200 mg by mouth 2 (two) times daily.   Yes Historical Provider, MD  cholecalciferol (VITAMIN D) 1000 UNITS tablet Take 1,000 Units by mouth daily.   Yes Historical Provider, MD  Cyanocobalamin (B-12 COMPLIANCE INJECTION IJ) Inject as directed every 30 (thirty) days.   Yes Historical Provider, MD  lisinopril (PRINIVIL,ZESTRIL) 20 MG tablet Take 20 mg by mouth 2 (two) times daily.  04/19/13  Yes Historical Provider, MD  omeprazole (PRILOSEC) 20 MG capsule Take 20 mg by mouth daily. 12/12/14  Yes Historical Provider, MD  QUEtiapine (SEROQUEL) 25 MG tablet Take 25 mg by mouth at bedtime. She takes two 25mg  tabs at bedtime   Yes Historical Provider, MD  verapamil (CALAN-SR) 240 MG CR tablet Take 240 mg by mouth at bedtime.  04/19/13  Yes Historical Provider, MD    Family History  Problem Relation Age of Onset  . Stroke Father   . Hypertension Father   . Colon cancer Neg Hx   . 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 Smoking status: Never  Smoker   . Smokeless tobacco: Not on file  . Alcohol Use: No   Allergies as of 12/27/2014 - Review Complete 12/27/2014  Allergen Reaction Noted  . Indomethacin Hives 05/10/2013  . Pollen extract Other (See Comments) 12/25/2014   Review of Systems:    All systems reviewed and negative except where noted in HPI.   Physical Exam:  BP 134/77 mmHg  Pulse 75  Temp(Src) 98.3 F (36.8 C) (Oral)  Ht 5\' 4"  (1.626 m)  Wt 165 lb 9.6 oz (75.116 kg)  BMI 28.41 kg/m2 No LMP recorded. Patient has had a hysterectomy. Psych:  Alert and cooperative. Normal mood and affect. General:   Alert,  Well-developed, well-nourished, pleasant and cooperative in NAD Head:  Normocephalic and atraumatic. Eyes:  Sclera clear, no icterus.   Conjunctiva pink. Ears:  Normal auditory acuity. Nose:  No deformity, discharge, or lesions. Mouth:  No deformity or  lesions,oropharynx pink & moist. Neck:  Supple; no masses or thyromegaly. Lungs:  Respirations even and unlabored.  Clear throughout to auscultation.   No wheezes, crackles, or rhonchi. No acute distress. Heart:  Regular rate and rhythm; no murmurs, clicks, rubs, or gallops. Abdomen:  Normal bowel sounds.  No bruits.  Soft, non-tender.  Mild distention.  Without masses, hepatosplenomegaly or hernias noted.  No guarding or rebound tenderness.     Rectal:  Deferred.  Msk:  Symmetrical without gross deformities.  Good, equal movement & strength bilaterally. Pulses:  Normal pulses noted. Extremities:  No clubbing or edema.  No cyanosis. Left AC with well healing fistula site. Neurologic:  Alert and oriented x3;  grossly normal neurologically. Skin:  Intact without significant lesions or rashes.  No jaundice. Lymph Nodes:  No significant cervical adenopathy. Psych:  Alert and cooperative. Normal mood and affect.

## 2014-12-27 NOTE — Assessment & Plan Note (Signed)
See chronic constipation.

## 2014-12-27 NOTE — Telephone Encounter (Signed)
We discussed labs and KUB results.  She will increase MiraLAX to 17 g twice a day for the next 2-3 days to initiate a good bowel purge. If no relief or no better, she will call me back. Will make a follow-up appointment in 2 weeks.

## 2014-12-27 NOTE — Patient Outreach (Signed)
Pt called RNCM to cancel appt related pt stating she's not feeling well today and her physician wanted her to be seen and have an x-ray. Pt stated she had been struggling with nausea since her fistula surgery and the MD was trying to figure out why. Pt stated she would call RNCM on Monday to reschedule after she receives her work hours. RNCM reinforced with pt that RNCM is available to discuss nursing questions and the [facility] 24 hour nurse line was also available.   Plan:RNCM will cancel appt for today and await phone call on Monday to reschedule. RNCM will call pt on Tuesday of next week if pt has not called.   Rutherford Limerick RN, BSN  [facility] Care Management [phone])

## 2014-12-27 NOTE — Assessment & Plan Note (Addendum)
Joyce [name] is a pleasant 69 y.o. female with 4-5 weeks of abdominal pain, constipation, bloating, nausea & vomiting that correlates with the recent surgery for fistula placement for dialysis if needed in the future.  I suspect she may have some post operative constipation and bloating that is causing a lot of her symptoms. She does have history of pancreatitis and so we will check for recurrence. This was due to Depakote and she has not had any issues since she discontinued. We'll check for urine tract infection as I can cause her nausea and abdominal pain and vomiting as well.   Labs- We will send a letter with results. KUB today if nothing to explain symptoms, would have low threshold for noncontrast CT A/P Go to ER if unable to keep down fluids, severe pain or vomiting. MiraLAX 17 g daily constipation

## 2014-12-28 NOTE — Telephone Encounter (Signed)
Should have results within 1-2 days

## 2014-12-29 NOTE — Addendum Note (Signed)
Addended by: Otilio Jefferson on: 12/29/2014 08:39 AM   Modules accepted: Orders

## 2015-01-04 ENCOUNTER — Encounter: Payer: Self-pay | Admitting: *Deleted

## 2015-01-04 ENCOUNTER — Other Ambulatory Visit: Payer: Self-pay | Admitting: *Deleted

## 2015-01-04 NOTE — Patient Outreach (Signed)
[facility]) Care Management   01/04/2015  Joyce [name] 05-19-1946 [id number]  Joyce [name] is an 69 y.o. female  Subjective: "My blood work has shown some decline in my kidney function so it looks like I may be starting dialysis soon, I want to be ready."   Objective:  Blood pressure 130/70, pulse 76, resp. rate 18, height 1.613 m (5' 3.5"), weight 164 lb (74.39 kg), SpO2 98 %. Review of Systems  Constitutional: Negative for fever.  Gastrointestinal: Positive for constipation.  All other systems reviewed and are negative.   Physical Exam  Vitals reviewed. Constitutional: She is oriented to person, place, and time. She appears well-developed and well-nourished.  Cardiovascular: Normal heart sounds.   Pulses:      Dorsalis pedis pulses are 2+ on the right side, and 2+ on the left side.  Respiratory: Effort normal and breath sounds normal.  GI: Soft. Bowel sounds are normal.  Musculoskeletal: Normal range of motion.  Neurological: She is alert and oriented to person, place, and time.  Skin: Skin is warm and dry.     Psychiatric: She has a normal mood and affect. Her speech is normal and behavior is normal. Judgment and thought content normal. Cognition and memory are normal.    Current Medications:   Current Outpatient Prescriptions  Medication Sig Dispense Refill  . acetaminophen (TYLENOL) 325 MG tablet Take 650 mg by mouth every 4 (four) hours as needed for mild pain.    . carbamazepine (TEGRETOL XR) 200 MG 12 hr tablet Take 200 mg by mouth 2 (two) times daily.    . cholecalciferol (VITAMIN D) 1000 UNITS tablet Take 1,000 Units by mouth daily.    . Cyanocobalamin (B-12 COMPLIANCE INJECTION IJ) Inject as directed every 30 (thirty) days.    Marland Kitchen lisinopril (PRINIVIL,ZESTRIL) 20 MG tablet Take 20 mg by mouth 2 (two) times daily.     Marland Kitchen omeprazole (PRILOSEC) 20 MG capsule Take 20 mg by mouth daily.  3  . Polyethylene Glycol POWD Take 17 g by mouth Nightly.  527 g 5  . polyethylene glycol powder (GLYCOLAX/MIRALAX) powder Take 1 Container by mouth daily.    . QUEtiapine (SEROQUEL) 25 MG tablet Take 25 mg by mouth at bedtime. She takes two 25mg  tabs at bedtime    . verapamil (CALAN-SR) 240 MG CR tablet Take 240 mg by mouth at bedtime.      No current facility-administered medications for this visit.    Functional Status:   In your present state of health, do you have any difficulty performing the following activities: 11/08/2014 10/09/2014  Hearing? N Y  Vision? N -  Difficulty concentrating or making decisions? N -  Walking or climbing stairs? N -  Dressing or bathing? N -  Doing errands, shopping? N -  Preparing Food and eating ? N -  Using the Toilet? N -  In the past six months, have you accidently leaked urine? N -  Do you have problems with loss of bowel control? N -  Managing your Medications? N -  Managing your Finances? N -  Housekeeping or managing your Housekeeping? N -    Fall/Depression Screening:    PHQ 2/9 Scores 10/09/2014  PHQ - 2 Score 0   Assessment:  Alert and oriented x3, physical exam wnl. Fistula site healed with bruit easily palpable. Pt stated constipation and nausea had resolved with the addition of the miralax powder. Pt concerned with the prospect of upcoming dialysis. Pt assisted  in making an appt to tour dialysis center. RNCM assisted pt in recovering password for her kidney doctors EMR pt access site. Pt stated the more ready she feels the less anxiety she has about the dialysis. RNCM discussed dialysis and gave her education from [facility] related to dialysis and constipation. Also discussed with the pt the need for her to have med alert and a medical identification bracelet for safety with dialysis since she lives alone.    Plan: RNCM will route this encounter to the primary care MD for an update. RNCM will e-mail pt EMMI dialysis video. RNCM will see pt for routine home visit.   Rutherford Limerick RN, BSN  [facility] Care  Management [phone])

## 2015-01-10 ENCOUNTER — Ambulatory Visit: Payer: Commercial Managed Care - HMO | Admitting: Urgent Care

## 2015-02-12 ENCOUNTER — Other Ambulatory Visit: Payer: Self-pay | Admitting: *Deleted

## 2015-02-12 ENCOUNTER — Other Ambulatory Visit: Payer: Self-pay | Admitting: Internal Medicine

## 2015-02-12 DIAGNOSIS — N2581 Secondary hyperparathyroidism of renal origin: Secondary | ICD-10-CM | POA: Diagnosis not present

## 2015-02-12 DIAGNOSIS — Z1329 Encounter for screening for other suspected endocrine disorder: Secondary | ICD-10-CM | POA: Diagnosis not present

## 2015-02-12 DIAGNOSIS — Z1231 Encounter for screening mammogram for malignant neoplasm of breast: Secondary | ICD-10-CM

## 2015-02-12 DIAGNOSIS — Q613 Polycystic kidney, unspecified: Secondary | ICD-10-CM | POA: Diagnosis not present

## 2015-02-12 DIAGNOSIS — Z1239 Encounter for other screening for malignant neoplasm of breast: Secondary | ICD-10-CM | POA: Diagnosis not present

## 2015-02-12 DIAGNOSIS — I10 Essential (primary) hypertension: Secondary | ICD-10-CM | POA: Diagnosis not present

## 2015-02-12 DIAGNOSIS — D631 Anemia in chronic kidney disease: Secondary | ICD-10-CM | POA: Diagnosis not present

## 2015-02-12 DIAGNOSIS — Z8659 Personal history of other mental and behavioral disorders: Secondary | ICD-10-CM | POA: Diagnosis not present

## 2015-02-12 DIAGNOSIS — R42 Dizziness and giddiness: Secondary | ICD-10-CM | POA: Diagnosis not present

## 2015-02-12 DIAGNOSIS — N184 Chronic kidney disease, stage 4 (severe): Secondary | ICD-10-CM | POA: Diagnosis not present

## 2015-02-12 NOTE — Patient Outreach (Signed)
[facility]) Care Management   02/12/2015  Joyce [name] 1945/11/14 [id number]  Joyce [name] is an 69 y.o. female  Subjective: "I went to Dr. Lavera Guise today and he said I was doing really well and my constipation has resolved." " I saw Dr. Holley Raring today and he said I was doing well and that my fistula looked really good."    Objective:  Blood pressure 124/76, pulse 84, resp. rate 18, height 1.6 m (5' 3"), weight 167 lb (75.751 kg), SpO2 98 %. Review of Systems  Constitutional: Negative.   HENT: Negative.   Eyes: Negative.   Respiratory: Negative.   Cardiovascular: Negative.   Gastrointestinal: Negative.   Genitourinary: Negative.   Musculoskeletal: Negative.   Skin: Negative.   Neurological: Negative.   Endo/Heme/Allergies: Negative.   Psychiatric/Behavioral: Negative.     Physical Exam  Constitutional: She is oriented to person, place, and time. She appears well-developed and well-nourished.  Cardiovascular: Normal rate, regular rhythm and normal heart sounds.   Pulses:      Dorsalis pedis pulses are 2+ on the right side, and 2+ on the left side.  Respiratory: Effort normal and breath sounds normal.  GI: Soft. Bowel sounds are normal.  Musculoskeletal: Normal range of motion.  Neurological: She is alert and oriented to person, place, and time.  Skin: Skin is warm, dry and intact.     Psychiatric: She has a normal mood and affect. Her speech is normal and behavior is normal. Judgment and thought content normal. Cognition and memory are normal.    Current Medications:   Current Outpatient Prescriptions  Medication Sig Dispense Refill  . acetaminophen (TYLENOL) 325 MG tablet Take 650 mg by mouth every 4 (four) hours as needed for mild pain.    . carbamazepine (TEGRETOL XR) 200 MG 12 hr tablet Take 200 mg by mouth 2 (two) times daily.    . cholecalciferol (VITAMIN D) 1000 UNITS tablet Take 1,000 Units by mouth daily.    . Cyanocobalamin (B-12  COMPLIANCE INJECTION IJ) Inject as directed every 30 (thirty) days.    Marland Kitchen lisinopril (PRINIVIL,ZESTRIL) 20 MG tablet Take 20 mg by mouth 2 (two) times daily.     . QUEtiapine (SEROQUEL) 25 MG tablet Take 25 mg by mouth at bedtime. She takes two 44m tabs at bedtime    . verapamil (CALAN-SR) 240 MG CR tablet Take 240 mg by mouth at bedtime.     .Marland Kitchenomeprazole (PRILOSEC) 20 MG capsule Take 20 mg by mouth daily.  3  . Polyethylene Glycol POWD Take 17 g by mouth Nightly. (Patient not taking: Reported on 02/12/2015) 527 g 5  . polyethylene glycol powder (GLYCOLAX/MIRALAX) powder Take 1 Container by mouth daily.     No current facility-administered medications for this visit.    Functional Status:   In your present state of health, do you have any difficulty performing the following activities: 02/12/2015 11/08/2014  Hearing? N N  Vision? N N  Difficulty concentrating or making decisions? N N  Walking or climbing stairs? N N  Dressing or bathing? N N  Doing errands, shopping? N N  Preparing Food and eating ? - N  Using the Toilet? - N  In the past six months, have you accidently leaked urine? - N  Do you have problems with loss of bowel control? - N  Managing your Medications? - N  Managing your Finances? - N  Housekeeping or managing your Housekeeping? - N    Fall/Depression Screening:  PHQ 2/9 Scores 02/12/2015 10/09/2014  PHQ - 2 Score 0 0    Assessment: See physical assessment above.  Pt pleased with doctor visits today. B/P remains stable. Pt continues with home monitoring of weight and B/P. She continues to be able to work at [employer] and do a home exercise routine. Pt in good spirits and feels she has met her goals. Pt reports ordering a medical ID bracelet and feels more comfortable that she is prepared for dialysis when she needs it.  RNCM made pt aware she could call [place] Management any time she has a need arise, but as of now all care management goals have been met.   Plan:    RNCM will close this case. RNCM will make MD aware of case closure and goals met. RNCM will make [facility] aware of case closure.   Rutherford Limerick RN, BSN  [facility] Care Management [phone])

## 2015-02-14 NOTE — Patient Outreach (Signed)
[facility] [facility]) Care Management  02/14/2015  Leyanna Bittman [facility] [birth year] [id number]   Notification from [place], RN to close case due to patient goals met with [facility] Care Management.  Ronnell Freshwater. [place], [place] Management [place] Assistant Phone: [phone] Fax: [phone]

## 2015-02-22 ENCOUNTER — Encounter: Payer: Self-pay | Admitting: Urgent Care

## 2015-02-26 ENCOUNTER — Ambulatory Visit
Admission: RE | Admit: 2015-02-26 | Discharge: 2015-02-26 | Disposition: A | Discharge status: Home / Self Care | Payer: Commercial Managed Care - HMO | Source: Ambulatory Visit | Attending: Internal Medicine | Admitting: Internal Medicine

## 2015-02-26 ENCOUNTER — Other Ambulatory Visit: Payer: Self-pay | Admitting: Internal Medicine

## 2015-02-26 DIAGNOSIS — F3177 Bipolar disorder, in partial remission, most recent episode mixed: Secondary | ICD-10-CM | POA: Diagnosis not present

## 2015-02-26 DIAGNOSIS — Z1231 Encounter for screening mammogram for malignant neoplasm of breast: Secondary | ICD-10-CM

## 2015-03-29 DIAGNOSIS — N185 Chronic kidney disease, stage 5: Secondary | ICD-10-CM | POA: Diagnosis not present

## 2015-03-29 DIAGNOSIS — E785 Hyperlipidemia, unspecified: Secondary | ICD-10-CM | POA: Diagnosis not present

## 2015-03-29 DIAGNOSIS — Y841 Kidney dialysis as the cause of abnormal reaction of the patient, or of later complication, without mention of misadventure at the time of the procedure: Secondary | ICD-10-CM | POA: Diagnosis not present

## 2015-03-29 DIAGNOSIS — Z4889 Encounter for other specified surgical aftercare: Secondary | ICD-10-CM | POA: Diagnosis not present

## 2015-03-29 DIAGNOSIS — T82318A Breakdown (mechanical) of other vascular grafts, initial encounter: Secondary | ICD-10-CM | POA: Diagnosis not present

## 2015-03-29 DIAGNOSIS — D631 Anemia in chronic kidney disease: Secondary | ICD-10-CM | POA: Diagnosis not present

## 2015-03-29 DIAGNOSIS — Q613 Polycystic kidney, unspecified: Secondary | ICD-10-CM | POA: Diagnosis not present

## 2015-03-29 DIAGNOSIS — N2581 Secondary hyperparathyroidism of renal origin: Secondary | ICD-10-CM | POA: Diagnosis not present

## 2015-03-29 DIAGNOSIS — I10 Essential (primary) hypertension: Secondary | ICD-10-CM | POA: Diagnosis not present

## 2015-03-29 DIAGNOSIS — N184 Chronic kidney disease, stage 4 (severe): Secondary | ICD-10-CM | POA: Diagnosis not present

## 2015-03-29 DIAGNOSIS — N289 Disorder of kidney and ureter, unspecified: Secondary | ICD-10-CM | POA: Diagnosis not present

## 2015-03-29 DIAGNOSIS — E669 Obesity, unspecified: Secondary | ICD-10-CM | POA: Diagnosis not present

## 2015-04-20 DIAGNOSIS — N186 End stage renal disease: Secondary | ICD-10-CM | POA: Diagnosis not present

## 2015-04-20 DIAGNOSIS — M545 Low back pain: Secondary | ICD-10-CM | POA: Diagnosis not present

## 2015-04-20 DIAGNOSIS — Z992 Dependence on renal dialysis: Secondary | ICD-10-CM | POA: Diagnosis not present

## 2015-04-23 DIAGNOSIS — M797 Fibromyalgia: Secondary | ICD-10-CM | POA: Diagnosis not present

## 2015-04-23 DIAGNOSIS — R5382 Chronic fatigue, unspecified: Secondary | ICD-10-CM | POA: Diagnosis not present

## 2015-04-24 DIAGNOSIS — Z23 Encounter for immunization: Secondary | ICD-10-CM | POA: Diagnosis not present

## 2015-05-11 DIAGNOSIS — N184 Chronic kidney disease, stage 4 (severe): Secondary | ICD-10-CM | POA: Diagnosis not present

## 2015-05-11 DIAGNOSIS — N185 Chronic kidney disease, stage 5: Secondary | ICD-10-CM | POA: Diagnosis not present

## 2015-05-11 DIAGNOSIS — I10 Essential (primary) hypertension: Secondary | ICD-10-CM | POA: Diagnosis not present

## 2015-05-11 DIAGNOSIS — D631 Anemia in chronic kidney disease: Secondary | ICD-10-CM | POA: Diagnosis not present

## 2015-05-11 DIAGNOSIS — N2581 Secondary hyperparathyroidism of renal origin: Secondary | ICD-10-CM | POA: Diagnosis not present

## 2015-05-15 ENCOUNTER — Other Ambulatory Visit: Payer: Self-pay | Admitting: Vascular Surgery

## 2015-05-29 ENCOUNTER — Encounter
Admission: RE | Disposition: A | Discharge status: Home / Self Care | Payer: Self-pay | Source: Ambulatory Visit | Attending: Vascular Surgery

## 2015-05-29 ENCOUNTER — Ambulatory Visit
Admission: RE | Admit: 2015-05-29 | Discharge: 2015-05-29 | Disposition: A | Discharge status: Home / Self Care | Payer: Commercial Managed Care - HMO | Source: Ambulatory Visit | Attending: Vascular Surgery | Admitting: Vascular Surgery

## 2015-05-29 DIAGNOSIS — K602 Anal fissure, unspecified: Secondary | ICD-10-CM | POA: Insufficient documentation

## 2015-05-29 DIAGNOSIS — F319 Bipolar disorder, unspecified: Secondary | ICD-10-CM | POA: Insufficient documentation

## 2015-05-29 DIAGNOSIS — Z992 Dependence on renal dialysis: Secondary | ICD-10-CM | POA: Insufficient documentation

## 2015-05-29 DIAGNOSIS — K635 Polyp of colon: Secondary | ICD-10-CM | POA: Diagnosis not present

## 2015-05-29 DIAGNOSIS — I120 Hypertensive chronic kidney disease with stage 5 chronic kidney disease or end stage renal disease: Secondary | ICD-10-CM | POA: Insufficient documentation

## 2015-05-29 DIAGNOSIS — T82858A Stenosis of vascular prosthetic devices, implants and grafts, initial encounter: Secondary | ICD-10-CM | POA: Diagnosis not present

## 2015-05-29 DIAGNOSIS — R011 Cardiac murmur, unspecified: Secondary | ICD-10-CM | POA: Insufficient documentation

## 2015-05-29 DIAGNOSIS — N186 End stage renal disease: Secondary | ICD-10-CM | POA: Insufficient documentation

## 2015-05-29 DIAGNOSIS — K5792 Diverticulitis of intestine, part unspecified, without perforation or abscess without bleeding: Secondary | ICD-10-CM | POA: Diagnosis not present

## 2015-05-29 DIAGNOSIS — R42 Dizziness and giddiness: Secondary | ICD-10-CM | POA: Diagnosis not present

## 2015-05-29 HISTORY — DX: Cardiac murmur, unspecified: R01.1

## 2015-05-29 HISTORY — DX: Essential (primary) hypertension: I10

## 2015-05-29 HISTORY — PX: PERIPHERAL VASCULAR CATHETERIZATION: SHX172C

## 2015-05-29 LAB — POTASSIUM (ARMC VASCULAR LAB ONLY): Potassium (ARMC vascular lab): 4.8 (ref 3.5–5.1)

## 2015-05-29 SURGERY — A/V SHUNTOGRAM/FISTULAGRAM
Anesthesia: Moderate Sedation

## 2015-05-29 MED ORDER — DEXTROSE 5 % IV SOLN
INTRAVENOUS | Status: AC
  Administered 2015-05-29: 15:00:00
  Filled 2015-05-29: qty 1.5

## 2015-05-29 MED ORDER — FENTANYL CITRATE (PF) 100 MCG/2ML IJ SOLN
INTRAMUSCULAR | Status: DC | PRN
  Administered 2015-05-29 (×2): 50 ug via INTRAVENOUS

## 2015-05-29 MED ORDER — HEPARIN (PORCINE) IN NACL 2-0.9 UNIT/ML-% IJ SOLN
INTRAMUSCULAR | Status: AC
  Filled 2015-05-29: qty 1000

## 2015-05-29 MED ORDER — MIDAZOLAM HCL 2 MG/2ML IJ SOLN
INTRAMUSCULAR | Status: DC | PRN
  Administered 2015-05-29: 2 mg via INTRAVENOUS
  Administered 2015-05-29: 1 mg via INTRAVENOUS

## 2015-05-29 MED ORDER — LIDOCAINE HCL (PF) 1 % IJ SOLN
INTRAMUSCULAR | Status: AC
  Filled 2015-05-29: qty 10

## 2015-05-29 MED ORDER — FENTANYL CITRATE (PF) 100 MCG/2ML IJ SOLN
INTRAMUSCULAR | Status: AC
  Filled 2015-05-29: qty 2

## 2015-05-29 MED ORDER — HEPARIN SODIUM (PORCINE) 1000 UNIT/ML IJ SOLN
INTRAMUSCULAR | Status: AC
  Filled 2015-05-29: qty 1

## 2015-05-29 MED ORDER — SODIUM CHLORIDE 0.9 % IV SOLN
INTRAVENOUS | Status: DC
  Administered 2015-05-29: 12:00:00 via INTRAVENOUS

## 2015-05-29 MED ORDER — IOHEXOL 300 MG/ML  SOLN
INTRAMUSCULAR | Status: DC | PRN
  Administered 2015-05-29: 35 mL via INTRAVENOUS

## 2015-05-29 MED ORDER — DEXTROSE 5 % IV SOLN: 1.5000 g | INTRAVENOUS | Status: DC

## 2015-05-29 MED ORDER — MIDAZOLAM HCL 5 MG/5ML IJ SOLN
INTRAMUSCULAR | Status: AC
  Filled 2015-05-29: qty 5

## 2015-05-29 SURGICAL SUPPLY — 8 items
BALLN ULTRVRSE 8X100X130 (BALLOONS) ×2 IMPLANT
BALLOON ARMADA 7X100X80 (BALLOONS) ×2 IMPLANT
DRAPE BRACHIAL (DRAPES) ×2 IMPLANT
PACK ANGIOGRAPHY (CUSTOM PROCEDURE TRAY) ×2 IMPLANT
SHEATH BRITE TIP 6FRX5.5 (SHEATH) ×2 IMPLANT
SUT MNCRL AB 4-0 PS2 18 (SUTURE) ×2 IMPLANT
TOWEL OR 17X26 4PK STRL BLUE (TOWEL DISPOSABLE) ×2 IMPLANT
WIRE MAGIC TORQUE 260C (WIRE) ×2 IMPLANT

## 2015-05-29 NOTE — Progress Notes (Signed)
Pt here for fistulogram, vss, in procedure, will monitor et02 during case as well as vitals being monitor per vasc. Tech,

## 2015-05-29 NOTE — Op Note (Signed)
OPERATIVE NOTE   PROCEDURE: 1. Contrast injection left brachiocephalic fistula 2. Percutaneous transluminal and plasty to 8 mm left brachiocephalic fistula  PRE-OPERATIVE DIAGNOSIS: Complication of dialysis access                                                       End Stage Renal Disease  POST-OPERATIVE DIAGNOSIS: same as above   SURGEON: Katha Cabal, M.D.  ANESTHESIA: Conscious Sedation   ESTIMATED BLOOD LOSS: minimal  FINDING(S): 1. Greater than 80% narrowing of the fistula in its midportion  SPECIMEN(S):  None  CONTRAST: 35 cc  FLUOROSCOPY TIME: 1.0 minutes  INDICATIONS: Joyce [name] is a 69 y.o. female who  presents with malfunctioning left brachiocephalic AV access.  The patient is scheduled for angiography with possible intervention of the AV access.  The patient is aware the risks include but are not limited to: bleeding, infection, thrombosis of the cannulated access, and possible anaphylactic reaction to the contrast.  The patient acknowledges if the access can not be salvaged a tunneled catheter will be needed and will be placed during this procedure.  The patient is aware of the risks of the procedure and elects to proceed with the angiogram and intervention.  DESCRIPTION: After full informed written consent was obtained, the patient was brought back to the Special Procedure suite and placed supine position.  Appropriate cardiopulmonary monitors were placed.  The left arm was prepped and draped in the standard fashion.  Appropriate timeout is called. The left brachiocephalic  was cannulated with a micropuncture needle.  The microwire was advanced and the needle was exchanged for  a microsheath.  The J-wire was then advanced and a 6 Fr sheath inserted.  Hand injections were completed to image the access from the arterial anastomosis through the entire access.  The central venous structures were also imaged by hand injections.  Based on the images,  3000 units  of heparin was given and a to 60 Magic torque wire was advanced through the fistula under fluoroscopic guidance. Initially a 7 x 10 and then an 8 x 10 balloon was used to treat the areas of narrowing in the midportion of the fistula inflations were to 10 atm for little over 1 minute each. All up imaging demonstrated less than 5% residual stenosis.    A 4-0 Monocryl purse-string suture was sewn around the sheath.  The sheath was removed and light pressure was applied.  A sterile bandage was applied to the puncture site.   Interpretation: The AV fistula is somewhat tortuous and there are several tributaries noted. Overall however it appears to measure 8-9 mm in diameter. There are tandem lesions with 1 in particular that is greater than 90%. These are located in the midportion of the fistula were cannulation would take place. The more proximal cephalic vein and the subclavian cephalic confluence are widely patent that is the central veins. Reflux demonstrates patency of the anastomosis as well.  Following angioplasty first to 7 mm and the 8 mm there is now resolution of the previously noted strictures.    COMPLICATIONS: None  CONDITION: Joyce [name], M.D [facility] Office: [phone]  05/29/2015 3:03 PM

## 2015-05-29 NOTE — H&P (Signed)
[facility] Admission History & Physical  MRN : [id number]  Joyce [name] is a 69 y.o. ([birth year]) female who presents with chief complaint of problems with my dialysis access.  History of Present Illness: Patient is sent by her dialysis center secondary to difficulties with her dialysis runs and problems with cannulation. She was seen 31 days ago in the office at which time ultrasound did note a area consistent with a frozen valve in elevated velocities. However there did not appear to be any troubles at that time and it was elected to continue observation. Since that decision there been increasing difficulties with her access and she now requires treatment of this area. She denies fever chills while on dialysis. She denies hand pain or other symptoms consistent with ischemic steal.  Current Facility-Administered Medications  Medication Dose Route Frequency Provider Last Rate Last Dose  . 0.9 %  sodium chloride infusion   Intravenous Continuous Janalyn Harder Stegmayer, PA-C 10 mL/hr at 05/29/15 1132    . cefUROXime (ZINACEF) 1.5 g in dextrose 5 % 50 mL IVPB  1.5 g Intravenous 30 min Pre-Op Kimberly A Stegmayer, PA-C      . dextrose 5 % with cefUROXime (ZINACEF) ADS Med             Past Medical History  Diagnosis Date  . Colon polyps   . Diverticulitis   . CKD (chronic kidney disease)     Dr Holley Raring [name] 4  . Diverticulitis   . Anal fissure   . Vertigo   . Pancreatitis     Valproic acid  . Bipolar affective disorder ([place])   . Hypertension   . Heart murmur     Past Surgical History  Procedure Laterality Date  . Cholecystectomy  2003  . Eye surgery    . Colonoscopy  2014    Dr. Jamal Collin  . Abdominal hysterectomy  1990 ?  Marland Kitchen Av fistula placement  4/30  . Breast excisional biopsy Left 1994    neg surgical bx    Social History Social History  Substance Use Topics  . Smoking status: Never Smoker   . Smokeless tobacco: None  . Alcohol Use: No     Family History Family History  Problem Relation Age of Onset  . Stroke Father   . Hypertension Father   . Colon cancer Neg Hx   . Liver disease Neg Hx    no family history of porphyria, autoimmune disease or bleeding clotting disorders  Allergies  Allergen Reactions  . Indomethacin Hives  . Pollen Extract Other (See Comments)    Sinus problems and HA     REVIEW OF SYSTEMS (Negative unless checked)  Constitutional: [] Weight loss  [] Fever  [] Chills Cardiac: [] Chest pain   [] Chest pressure   [] Palpitations   [] Shortness of breath when laying flat   [] Shortness of breath at rest   [] Shortness of breath with exertion. Vascular:  [] Pain in legs with walking   [] Pain in legs at rest   [] Pain in legs when laying flat   [] Claudication   [] Pain in feet when walking  [] Pain in feet at rest  [] Pain in feet when laying flat   [] History of DVT   [] Phlebitis   [] Swelling in legs   [] Varicose veins   [] Non-healing ulcers Pulmonary:   [] Uses home oxygen   [] Productive cough   [] Hemoptysis   [] Wheeze  [] COPD   [] Asthma Neurologic:  [] Dizziness  [] Blackouts   [] Seizures   [] History of  stroke   [] History of TIA  [] Aphasia   [] Temporary blindness   [] Dysphagia   [] Weakness or numbness in arms   [] Weakness or numbness in legs Musculoskeletal:  [] Arthritis   [] Joint swelling   [] Joint pain   [] Low back pain Hematologic:  [] Easy bruising  [] Easy bleeding   [] Hypercoagulable state   [] Anemic  [] Hepatitis Gastrointestinal:  [] Blood in stool   [] Vomiting blood  [] Gastroesophageal reflux/heartburn   [] Difficulty swallowing. Genitourinary:  [] Chronic kidney disease   [] Difficult urination  [] Frequent urination  [] Burning with urination   [] Blood in urine Skin:  [] Rashes   [] Ulcers   [] Wounds Psychological:  [] History of anxiety   []  History of major depression.  Physical Examination  Filed Vitals:   05/29/15 1118  BP: 134/73  Pulse: 71  Temp: 98 F (36.7 C)  TempSrc: Oral  Resp: 17  Height: 5' 3.5"  (1.613 m)  Weight: 81.647 kg (180 lb)  SpO2: 99%   Body mass index is 31.38 kg/(m^2). Gen: WD/WN, NAD Head: [place]/AT, No temporalis wasting.  Ear/Nose/Throat: Hearing grossly intact, nares w/o erythema or drainage, oropharynx w/o Erythema/Exudate, Eyes: PERRLA, EOMI.  Neck: Supple, no nuchal rigidity.  No bruit or JVD.  Pulmonary:  Good air movement, clear to auscultation bilaterally, no increased work of respiration or use of accessory muscles  Cardiac: RRR, normal S1, S2, no Murmurs, rubs or gallops. Vascular: Left arm AV graft with good bruit there is moderate pulsatility by exam. Left radial pulses weakly palpable Vessel Right Left  Radial Palpable Palpable  Ulnar  not Palpable  not Palpable  Brachial Palpable Palpable  Carotid Palpable, without bruit Palpable, without bruit  Gastrointestinal: soft, non-tender/non-distended. No guarding/reflex. No masses, surgical incisions, or scars. Musculoskeletal: M/S 5/5 throughout.  No deformity or atrophy. Neurologic: CN 2-12 intact. Pain and light touch intact in extremities.  Symmetrical.  Speech is fluent. Motor exam as listed above. Psychiatric: Judgment intact, Mood & affect appropriate for pt's clinical situation. Dermatologic: No rashes or ulcers noted.  No cellulitis or open wounds. Lymph : No Cervical, Axillary, or Inguinal lymphadenopathy.   CBC Lab Results  Component Value Date   WBC 6.1 12/27/2014   HGB 11.1* 12/27/2014   HCT 33.5* 12/27/2014   MCV 94.3 12/27/2014   PLT 296 12/27/2014    BMET    Component Value Date/Time   NA 136 12/27/2014 1241   NA 139 11/09/2014 1027   K 4.5 12/27/2014 1241   K 4.7 11/09/2014 1027   CL 104 12/27/2014 1241   CL 108 11/09/2014 1027   CO2 23 12/27/2014 1241   CO2 25 11/09/2014 1027   GLUCOSE 114* 12/27/2014 1241   GLUCOSE 100* 11/09/2014 1027   BUN 59* 12/27/2014 1241   BUN 51* 11/09/2014 1027   CREATININE 3.15* 12/27/2014 1241   CREATININE 2.98* 11/09/2014 1027   CALCIUM 9.0  12/27/2014 1241   CALCIUM 8.9 11/09/2014 1027   GFRNONAA 14* 12/27/2014 1241   GFRNONAA 15* 11/09/2014 1027   GFRNONAA 22* 05/15/2014 0606   GFRAA 16* 12/27/2014 1241   GFRAA 18* 11/09/2014 1027   GFRAA 27* 05/15/2014 0606   CrCl cannot be calculated (Patient has no serum creatinine result on file.).  COAG Lab Results  Component Value Date   INR 1.0 11/09/2014    Radiology No results found.  Assessment/Plan 1.  Complication dialysis device with difficulty accessing the AV access:  Patient's left dialysis access is problematic given the dialysis centers difficulty with accessing her AV fistula. The patient  will undergo angiography with the hope for intervention Potassium will be drawn to ensure that it is an appropriate level prior to performing the intervention. 2.  End-stage renal disease requiring hemodialysis:  Patient will continue dialysis therapy without further interruption if a successful thrombectomy is not achieved then catheter will be placed. Dialysis has already been arranged since the patient missed their previous session 3.  Hypertension:  Patient will continue medical management; nephrology is following no changes in oral medications.    Beyla Loney, Dolores Lory, MD  05/29/2015 2:31 PM

## 2015-05-29 NOTE — Discharge Instructions (Signed)
Fistulogram A fistulogram is an X-ray test to look inside the site where your blood is removed and returned to your body during dialysis (dialysis fistula). Your fistula allows easy and effective access, but sometimes problems can occur, such as narrowing or blockages. A narrowing or blockage can reduce the effectiveness of dialysis. A fistulogram helps to detect these problems. It also lets your health care provider know of any additional treatment you may need.  LET [facility] CARE PROVIDER KNOW ABOUT:  Any allergies you have.   All medicines you are taking, including vitamins, herbs, eye drops, creams, and over-the-counter medicines.  Previous problems you or members of your family have had with the use of anesthetics.  Any reactions you have had to contrast dye.  Any blood disorders you have.  Previous surgeries you have had.  Medical conditions you have.  Pain, redness, or swelling in your limb with the dialysis fistula.  Any bleeding from your dialysis fistula.  Any of the following in the part of your body where the dialysis fistula leads:  Numbness.  Tingling.  Cold feeling.  Bluish or pale white in color. RISKS AND COMPLICATIONS Generally, a fistulogram is a safe procedure. However, problems can occur and include:  Bleeding.  Infection.  A blood clot in the dialysis fistula.  A blood clot that travels to your lungs (pulmonary embolism). BEFORE THE PROCEDURE  Your health care provider may do some blood or urine tests or both. These will help your health care provider learn how well your kidneys and liver are working and how well your blood clots.  Ask your health care provider about:  Changing or stopping your regular medicines. This is especially important if you are taking diabetes medicines or blood thinners.  Taking medicines such as aspirin and ibuprofen. These medicines can thin your blood. Do not take these medicines before your procedure if your  health care provider asks you not to.  Do not eat or drink anything after midnight on the night before the procedure or as directed by your health care provider.  Ask your health care provider if you will be able to go home after the procedure. Some people stay overnight in the hospital.  Plan to have someone take you home after the procedure. PROCEDURE   A needle will be inserted into your arm. It will be used to give you medicine. Medicines given may include:  Numbing medicine (local anesthetic) to numb an area on the limb with your fistula.  A medicine to help you relax (sedative).  A medicine that makes you fall asleep (general anesthetic).  When your skin is numb, a needle will be inserted into your vein.  A thin, flexible tube (catheter) will be inserted into the needle and guided to the narrowedarea.  Dye (contrast material) will be injected into the catheter. As the contrast material passes through your body, you may feel warm.  X-rays will be taken. The contrast material will show where anynarrowing or blockages are.  A guide wire and balloon-tipped catheter will be advanced to the blockage site.  The balloon will be inflated for a short period oftime. The balloon may be inflated several times at this site, or the balloon may be moved to other sites.  If your health care provider determines the clot will be best treated by a clot-dissolving medicine (thrombolysis), this medicine can be delivered through the catheter instead of using the balloon.  A mechanical device at the end of the catheter can  also be used to break up the clot (thrombectomy).  At the end of the procedure, the catheter will be removed.  In some cases, the catheter site will be closed with a stitch or two.  Pressure will be applied to stop any bleeding, and your skin will be covered with a bandage (dressing). AFTER THE PROCEDURE  You will stay in a recovery area until the medicines have worn  off.  You will stay in bed for several hours.  As you begin to feel better, you will be offered ice and beverages. You may be given food.  Your health care provider will check your blood pressure and pulse and watch your access site.  When you can walk, drink, eat, and use the bathroom, you may be discharged.   This information is not intended to replace advice given to you by your health care provider. Make sure you discuss any questions you have with your health care provider.   Document Released: 11/21/2013 Document Reviewed: 11/21/2013 Elsevier Interactive Patient Education [employer].

## 2015-05-30 ENCOUNTER — Encounter: Payer: Self-pay | Admitting: Vascular Surgery

## 2015-06-18 DIAGNOSIS — F3177 Bipolar disorder, in partial remission, most recent episode mixed: Secondary | ICD-10-CM | POA: Diagnosis not present

## 2015-06-21 DIAGNOSIS — N185 Chronic kidney disease, stage 5: Secondary | ICD-10-CM | POA: Diagnosis not present

## 2015-06-21 DIAGNOSIS — N186 End stage renal disease: Secondary | ICD-10-CM | POA: Diagnosis not present

## 2015-06-21 DIAGNOSIS — T82858A Stenosis of vascular prosthetic devices, implants and grafts, initial encounter: Secondary | ICD-10-CM | POA: Diagnosis not present

## 2015-06-21 DIAGNOSIS — T82318A Breakdown (mechanical) of other vascular grafts, initial encounter: Secondary | ICD-10-CM | POA: Diagnosis not present

## 2015-06-21 DIAGNOSIS — I10 Essential (primary) hypertension: Secondary | ICD-10-CM | POA: Diagnosis not present

## 2015-06-21 DIAGNOSIS — Z992 Dependence on renal dialysis: Secondary | ICD-10-CM | POA: Diagnosis not present

## 2015-06-21 DIAGNOSIS — Y841 Kidney dialysis as the cause of abnormal reaction of the patient, or of later complication, without mention of misadventure at the time of the procedure: Secondary | ICD-10-CM | POA: Diagnosis not present

## 2015-06-21 DIAGNOSIS — E785 Hyperlipidemia, unspecified: Secondary | ICD-10-CM | POA: Diagnosis not present

## 2015-06-26 DIAGNOSIS — N184 Chronic kidney disease, stage 4 (severe): Secondary | ICD-10-CM | POA: Diagnosis not present

## 2015-06-26 DIAGNOSIS — N2581 Secondary hyperparathyroidism of renal origin: Secondary | ICD-10-CM | POA: Diagnosis not present

## 2015-06-26 DIAGNOSIS — I10 Essential (primary) hypertension: Secondary | ICD-10-CM | POA: Diagnosis not present

## 2015-06-26 DIAGNOSIS — D631 Anemia in chronic kidney disease: Secondary | ICD-10-CM | POA: Diagnosis not present

## 2015-07-05 ENCOUNTER — Telehealth: Payer: Self-pay | Admitting: Gastroenterology

## 2015-07-05 DIAGNOSIS — D649 Anemia, unspecified: Secondary | ICD-10-CM | POA: Diagnosis not present

## 2015-07-05 DIAGNOSIS — N184 Chronic kidney disease, stage 4 (severe): Secondary | ICD-10-CM | POA: Diagnosis not present

## 2015-07-05 DIAGNOSIS — L570 Actinic keratosis: Secondary | ICD-10-CM | POA: Diagnosis not present

## 2015-07-05 DIAGNOSIS — M545 Low back pain: Secondary | ICD-10-CM | POA: Diagnosis not present

## 2015-07-05 DIAGNOSIS — K5732 Diverticulitis of large intestine without perforation or abscess without bleeding: Secondary | ICD-10-CM | POA: Diagnosis not present

## 2015-07-05 NOTE — Telephone Encounter (Signed)
Spoke with pt regarding her diarrhea. Stated it only happens after she eats. It doesn't last long. Pt is not experiencing vomiting, severe abdominal pain, nausea, fever or rectal bleeding. Pt has been advise to stop milk containing products, avoid greasy, fried and spicy foods. She will take pepto for a few days and call me if symptoms worsen or starts having rectal bleeding. We will schedule her ASAP if this occurs.

## 2015-07-05 NOTE — Telephone Encounter (Signed)
Established patient of Vickey Huger, last seen in office June 2016. She would like to speak with the nurse.. 30 minutes after she eats she has diarrhea. This has been going on for a week.

## 2015-08-14 DIAGNOSIS — M545 Low back pain: Secondary | ICD-10-CM | POA: Diagnosis not present

## 2015-08-14 DIAGNOSIS — I10 Essential (primary) hypertension: Secondary | ICD-10-CM | POA: Diagnosis not present

## 2015-08-14 DIAGNOSIS — R197 Diarrhea, unspecified: Secondary | ICD-10-CM | POA: Diagnosis not present

## 2015-08-14 DIAGNOSIS — N185 Chronic kidney disease, stage 5: Secondary | ICD-10-CM | POA: Diagnosis not present

## 2015-08-14 DIAGNOSIS — K5732 Diverticulitis of large intestine without perforation or abscess without bleeding: Secondary | ICD-10-CM | POA: Diagnosis not present

## 2015-08-14 DIAGNOSIS — N184 Chronic kidney disease, stage 4 (severe): Secondary | ICD-10-CM | POA: Diagnosis not present

## 2015-08-14 DIAGNOSIS — D631 Anemia in chronic kidney disease: Secondary | ICD-10-CM | POA: Diagnosis not present

## 2015-08-14 DIAGNOSIS — N2581 Secondary hyperparathyroidism of renal origin: Secondary | ICD-10-CM | POA: Diagnosis not present

## 2015-08-16 DIAGNOSIS — R197 Diarrhea, unspecified: Secondary | ICD-10-CM | POA: Diagnosis not present

## 2015-08-20 DIAGNOSIS — M545 Low back pain: Secondary | ICD-10-CM | POA: Diagnosis not present

## 2015-08-20 DIAGNOSIS — R197 Diarrhea, unspecified: Secondary | ICD-10-CM | POA: Diagnosis not present

## 2015-08-20 DIAGNOSIS — Z8659 Personal history of other mental and behavioral disorders: Secondary | ICD-10-CM | POA: Diagnosis not present

## 2015-08-23 ENCOUNTER — Telehealth: Payer: Self-pay | Admitting: Surgery

## 2015-08-23 NOTE — Telephone Encounter (Signed)
Phone message opened in Error 

## 2015-09-12 DIAGNOSIS — F3177 Bipolar disorder, in partial remission, most recent episode mixed: Secondary | ICD-10-CM | POA: Diagnosis not present

## 2015-09-17 ENCOUNTER — Telehealth: Payer: Self-pay | Admitting: Gastroenterology

## 2015-09-17 NOTE — Telephone Encounter (Signed)
Ginger, can patient be moved up to an earlier, more urgent appointment date? Currently scheduled for Monday April 3rd. She is having chronic diarrhea and vomiting. Unable to keep anything down. No appetite and lost 6 pounds last week. If you need to speak with her about her symptoms/appointment she can be reached until 11:30am today or all day tomorrow.  Or you can let me know if her appointment can be moved and I can call her and reschedule. Thanks.

## 2015-09-17 NOTE — Telephone Encounter (Signed)
LVM for pt to return my call for pt to be seen sooner. Either March 1st or March 8th.

## 2015-09-18 NOTE — Telephone Encounter (Signed)
Pt returned call and has been scheduled for March 8th in [place].

## 2015-09-19 ENCOUNTER — Other Ambulatory Visit: Payer: Self-pay

## 2015-09-19 ENCOUNTER — Encounter: Payer: Self-pay | Admitting: Gastroenterology

## 2015-09-19 ENCOUNTER — Ambulatory Visit (INDEPENDENT_AMBULATORY_CARE_PROVIDER_SITE_OTHER): Payer: Medicare HMO | Admitting: Gastroenterology

## 2015-09-19 ENCOUNTER — Encounter: Payer: Self-pay | Admitting: *Deleted

## 2015-09-19 VITALS — BP 140/66 | HR 81 | Temp 97.7°F | Ht 64.0 in | Wt 184.0 lb

## 2015-09-19 DIAGNOSIS — R197 Diarrhea, unspecified: Secondary | ICD-10-CM

## 2015-09-19 DIAGNOSIS — G43A0 Cyclical vomiting, not intractable: Secondary | ICD-10-CM | POA: Diagnosis not present

## 2015-09-19 DIAGNOSIS — R1115 Cyclical vomiting syndrome unrelated to migraine: Secondary | ICD-10-CM

## 2015-09-19 NOTE — Progress Notes (Signed)
Primary Care Physician: Cletis Athens, MD  Primary Gastroenterologist:  Dr. Lucilla Lame  Chief Complaint  Patient presents with  . Diarrhea    HPI: Joyce [name] is a 70 y.o. female here diarrhea with nausea and vomiting. The patient reports that she has been having symptoms since November when she had a revision of her dialysis shunt. Patient states that shortly after that happened she started to have profuse diarrhea. The patient had a history of constipation in the past for which she was seen by [place] but now has been having diarrhea that she states wakes her up in the middle of night. She also reports that she is not able to eat a lot and has no appetite. She reports chronic heartburn despite omeprazole. The patient stopped her omeprazole to see if that was causing her diarrhea but the diarrhea did not go away. She then reported that she has not been eating despite having a 20 pound weight gain since she was seen in this office last year. She reports that she has diffuse abdominal pain with her diarrhea and has a lot of burping. The patient states that she had stool sample sent off without any pathogens found by her primary care provider although records of this are not in the computer.  Current Outpatient Prescriptions  Medication Sig Dispense Refill  . acetaminophen (TYLENOL) 325 MG tablet Take 650 mg by mouth every 4 (four) hours as needed for mild pain.    . carbamazepine (TEGRETOL XR) 200 MG 12 hr tablet Take 200 mg by mouth 2 (two) times daily.    . cholecalciferol (VITAMIN D) 1000 UNITS tablet Take 1,000 Units by mouth daily.    . Cyanocobalamin (B-12 COMPLIANCE INJECTION IJ) Inject as directed every 30 (thirty) days.    Marland Kitchen lisinopril (PRINIVIL,ZESTRIL) 20 MG tablet Take 20 mg by mouth 2 (two) times daily.     . QUEtiapine (SEROQUEL) 25 MG tablet Take 25 mg by mouth at bedtime. She takes two 25mg  tabs at bedtime    . verapamil (CALAN-SR) 240 MG CR tablet Take 240 mg by mouth at  bedtime.     Marland Kitchen omeprazole (PRILOSEC) 20 MG capsule Take 20 mg by mouth daily. Reported on 09/19/2015  3  . Polyethylene Glycol POWD Take 17 g by mouth Nightly. (Patient not taking: Reported on 02/12/2015) 527 g 5  . polyethylene glycol powder (GLYCOLAX/MIRALAX) powder Take 1 Container by mouth daily. Reported on 09/19/2015     No current facility-administered medications for this visit.    Allergies as of 09/19/2015 - Review Complete 09/19/2015  Allergen Reaction Noted  . Indomethacin Hives 05/10/2013  . Pollen extract Other (See Comments) 12/25/2014    ROS:  General: Negative for anorexia, weight loss, fever, chills, fatigue, weakness. ENT: Negative for hoarseness, difficulty swallowing , nasal congestion. CV: Negative for chest pain, angina, palpitations, dyspnea on exertion, peripheral edema.  Respiratory: Negative for dyspnea at rest, dyspnea on exertion, cough, sputum, wheezing.  GI: See history of present illness. GU:  Negative for dysuria, hematuria, urinary incontinence, urinary frequency, nocturnal urination.  Endo: Negative for unusual weight change.    Physical Examination:   BP 140/66 mmHg  Pulse 81  Temp(Src) 97.7 F (36.5 C) (Oral)  Ht 5\' 4"  (1.626 m)  Wt 184 lb (83.462 kg)  BMI 31.57 kg/m2  General: Well-nourished, well-developed in no acute distress.  Eyes: No icterus. Conjunctivae pink. Mouth: Oropharyngeal mucosa moist and pink , no lesions erythema or exudate. Lungs: Clear to auscultation  bilaterally. Non-labored. Heart: Regular rate and rhythm, no murmurs rubs or gallops.  Abdomen: Bowel sounds are normal, mild diffuse tenderness, nondistended, no hepatosplenomegaly or masses, no abdominal bruits or hernia , no rebound or guarding.   Extremities: No lower extremity edema. No clubbing or deformities. Neuro: Alert and oriented x 3.  Grossly intact. Skin: Warm and dry, no jaundice.   Psych: Alert and cooperative, normal mood and affect.  Labs:    Imaging  Studies: No results found.  Assessment and Plan:   Joyce [name] is a 70 y.o. y/o female comes in today with a history of diarrhea with nausea for the last 3 months. The patient will be set up for an EGD and colonoscopy to rule out inflammatory bowel disease versus microscopic colitis. The patient will also stop the omeprazole and she will be started on a trial of Dexilant to see if her symptoms improve. I have discussed risks & benefits which include, but are not limited to, bleeding, infection, perforation & drug reaction.  The patient agrees with this plan & written consent will be obtained.      Note: This dictation was prepared with Dragon dictation along with smaller phrase technology. Any transcriptional errors that result from this process are unintentional.

## 2015-09-21 NOTE — Discharge Instructions (Signed)

## 2015-09-24 ENCOUNTER — Ambulatory Visit: Payer: Commercial Managed Care - HMO | Admitting: Anesthesiology

## 2015-09-24 ENCOUNTER — Encounter
Admission: RE | Disposition: A | Discharge status: Home / Self Care | Payer: Self-pay | Source: Ambulatory Visit | Attending: Gastroenterology

## 2015-09-24 ENCOUNTER — Encounter: Payer: Self-pay | Admitting: *Deleted

## 2015-09-24 ENCOUNTER — Ambulatory Visit
Admission: RE | Admit: 2015-09-24 | Discharge: 2015-09-24 | Disposition: A | Discharge status: Home / Self Care | Payer: Commercial Managed Care - HMO | Source: Ambulatory Visit | Attending: Gastroenterology | Admitting: Gastroenterology

## 2015-09-24 DIAGNOSIS — N184 Chronic kidney disease, stage 4 (severe): Secondary | ICD-10-CM | POA: Diagnosis not present

## 2015-09-24 DIAGNOSIS — K317 Polyp of stomach and duodenum: Secondary | ICD-10-CM | POA: Insufficient documentation

## 2015-09-24 DIAGNOSIS — R197 Diarrhea, unspecified: Secondary | ICD-10-CM | POA: Diagnosis not present

## 2015-09-24 DIAGNOSIS — Z888 Allergy status to other drugs, medicaments and biological substances status: Secondary | ICD-10-CM | POA: Diagnosis not present

## 2015-09-24 DIAGNOSIS — I129 Hypertensive chronic kidney disease with stage 1 through stage 4 chronic kidney disease, or unspecified chronic kidney disease: Secondary | ICD-10-CM | POA: Diagnosis not present

## 2015-09-24 DIAGNOSIS — F319 Bipolar disorder, unspecified: Secondary | ICD-10-CM | POA: Insufficient documentation

## 2015-09-24 DIAGNOSIS — R110 Nausea: Secondary | ICD-10-CM | POA: Insufficient documentation

## 2015-09-24 DIAGNOSIS — K529 Noninfective gastroenteritis and colitis, unspecified: Secondary | ICD-10-CM | POA: Insufficient documentation

## 2015-09-24 DIAGNOSIS — Z8249 Family history of ischemic heart disease and other diseases of the circulatory system: Secondary | ICD-10-CM | POA: Insufficient documentation

## 2015-09-24 DIAGNOSIS — K859 Acute pancreatitis without necrosis or infection, unspecified: Secondary | ICD-10-CM | POA: Insufficient documentation

## 2015-09-24 DIAGNOSIS — D122 Benign neoplasm of ascending colon: Secondary | ICD-10-CM | POA: Diagnosis not present

## 2015-09-24 DIAGNOSIS — K3189 Other diseases of stomach and duodenum: Secondary | ICD-10-CM | POA: Insufficient documentation

## 2015-09-24 DIAGNOSIS — K219 Gastro-esophageal reflux disease without esophagitis: Secondary | ICD-10-CM | POA: Diagnosis not present

## 2015-09-24 DIAGNOSIS — Z86010 Personal history of colonic polyps: Secondary | ICD-10-CM | POA: Diagnosis not present

## 2015-09-24 DIAGNOSIS — Z823 Family history of stroke: Secondary | ICD-10-CM | POA: Diagnosis not present

## 2015-09-24 DIAGNOSIS — R112 Nausea with vomiting, unspecified: Secondary | ICD-10-CM | POA: Diagnosis not present

## 2015-09-24 DIAGNOSIS — D120 Benign neoplasm of cecum: Secondary | ICD-10-CM | POA: Insufficient documentation

## 2015-09-24 DIAGNOSIS — Z91048 Other nonmedicinal substance allergy status: Secondary | ICD-10-CM | POA: Insufficient documentation

## 2015-09-24 DIAGNOSIS — D132 Benign neoplasm of duodenum: Secondary | ICD-10-CM | POA: Diagnosis not present

## 2015-09-24 DIAGNOSIS — K5730 Diverticulosis of large intestine without perforation or abscess without bleeding: Secondary | ICD-10-CM | POA: Insufficient documentation

## 2015-09-24 HISTORY — DX: Family history of other specified conditions: Z84.89

## 2015-09-24 HISTORY — PX: ESOPHAGOGASTRODUODENOSCOPY (EGD) WITH PROPOFOL: SHX5813

## 2015-09-24 HISTORY — DX: Headache, unspecified: R51.9

## 2015-09-24 HISTORY — PX: COLONOSCOPY WITH PROPOFOL: SHX5780

## 2015-09-24 HISTORY — DX: Personal history of other diseases of the digestive system: Z87.19

## 2015-09-24 HISTORY — DX: Gastro-esophageal reflux disease without esophagitis: K21.9

## 2015-09-24 HISTORY — PX: POLYPECTOMY: SHX149

## 2015-09-24 HISTORY — DX: Headache: R51

## 2015-09-24 SURGERY — COLONOSCOPY WITH PROPOFOL
Anesthesia: Monitor Anesthesia Care | Wound class: Contaminated

## 2015-09-24 MED ORDER — GLYCOPYRROLATE 0.2 MG/ML IJ SOLN
INTRAMUSCULAR | Status: DC | PRN
  Administered 2015-09-24: 0.2 mg via INTRAVENOUS

## 2015-09-24 MED ORDER — LACTATED RINGERS IV SOLN
INTRAVENOUS | Status: DC
Start: 2015-09-24 — End: 2015-09-24
  Administered 2015-09-24: 10:00:00 via INTRAVENOUS

## 2015-09-24 MED ORDER — PROPOFOL 10 MG/ML IV BOLUS
INTRAVENOUS | Status: DC | PRN
  Administered 2015-09-24: 20 mg via INTRAVENOUS
  Administered 2015-09-24 (×5): 30 mg via INTRAVENOUS
  Administered 2015-09-24: 20 mg via INTRAVENOUS
  Administered 2015-09-24 (×5): 30 mg via INTRAVENOUS
  Administered 2015-09-24: 20 mg via INTRAVENOUS
  Administered 2015-09-24: 30 mg via INTRAVENOUS
  Administered 2015-09-24: 20 mg via INTRAVENOUS
  Administered 2015-09-24: 30 mg via INTRAVENOUS
  Administered 2015-09-24: 50 mg via INTRAVENOUS
  Administered 2015-09-24 (×2): 30 mg via INTRAVENOUS
  Administered 2015-09-24: 20 mg via INTRAVENOUS

## 2015-09-24 MED ORDER — ACETAMINOPHEN 325 MG PO TABS: 325.0000 mg | ORAL_TABLET | ORAL | Status: DC | PRN

## 2015-09-24 MED ORDER — ACETAMINOPHEN 160 MG/5ML PO SOLN: 325.0000 mg | ORAL | Status: DC | PRN

## 2015-09-24 MED ORDER — SODIUM CHLORIDE 0.9 % IV SOLN: INTRAVENOUS | Status: DC

## 2015-09-24 MED ORDER — LIDOCAINE HCL (CARDIAC) 20 MG/ML IV SOLN
INTRAVENOUS | Status: DC | PRN
  Administered 2015-09-24: 50 mg via INTRAVENOUS

## 2015-09-24 SURGICAL SUPPLY — 39 items
BALLN DILATOR 10-12 8 (BALLOONS)
BALLN DILATOR 12-15 8 (BALLOONS)
BALLN DILATOR 15-18 8 (BALLOONS)
BALLN DILATOR CRE 0-12 8 (BALLOONS)
BALLN DILATOR ESOPH 8 10 CRE (MISCELLANEOUS) IMPLANT
BALLOON DILATOR 12-15 8 (BALLOONS) IMPLANT
BALLOON DILATOR 15-18 8 (BALLOONS) IMPLANT
BALLOON DILATOR CRE 0-12 8 (BALLOONS) IMPLANT
BLOCK BITE 60FR ADLT L/F GRN (MISCELLANEOUS) ×3 IMPLANT
CANISTER SUCT 1200ML W/VALVE (MISCELLANEOUS) ×3 IMPLANT
FCP ESCP3.2XJMB 240X2.8X (MISCELLANEOUS)
FORCEPS BIOP RAD 4 LRG CAP 4 (CUTTING FORCEPS) ×3 IMPLANT
FORCEPS BIOP RJ4 240 W/NDL (MISCELLANEOUS)
FORCEPS ESCP3.2XJMB 240X2.8X (MISCELLANEOUS) IMPLANT
GOWN CVR UNV OPN BCK APRN NK (MISCELLANEOUS) ×4 IMPLANT
GOWN ISOL THUMB LOOP REG UNIV (MISCELLANEOUS) ×2
HEMOCLIP INSTINCT (CLIP) IMPLANT
INJECTOR VARIJECT VIN23 (MISCELLANEOUS) IMPLANT
KIT CO2 TUBING (TUBING) IMPLANT
KIT DEFENDO VALVE AND CONN (KITS) IMPLANT
KIT ENDO PROCEDURE OLY (KITS) ×3 IMPLANT
LIGATOR MULTIBAND 6SHOOTER MBL (MISCELLANEOUS) IMPLANT
MARKER SPOT ENDO TATTOO 5ML (MISCELLANEOUS) IMPLANT
PAD GROUND ADULT SPLIT (MISCELLANEOUS) IMPLANT
SNARE SHORT THROW 13M SML OVAL (MISCELLANEOUS) ×3 IMPLANT
SNARE SHORT THROW 30M LRG OVAL (MISCELLANEOUS) IMPLANT
SPOT EX ENDOSCOPIC TATTOO (MISCELLANEOUS)
SUCTION POLY TRAP 4CHAMBER (MISCELLANEOUS) IMPLANT
SYR INFLATION 60ML (SYRINGE) IMPLANT
TRAP SUCTION POLY (MISCELLANEOUS) ×3 IMPLANT
TUBING CONN 6MMX3.1M (TUBING)
TUBING SUCTION CONN 0.25 STRL (TUBING) IMPLANT
UNDERPAD 30X60 958B10 (PK) (MISCELLANEOUS) IMPLANT
VALVE BIOPSY ENDO (VALVE) IMPLANT
VARIJECT INJECTOR VIN23 (MISCELLANEOUS)
WATER AUXILLARY (MISCELLANEOUS) IMPLANT
WATER STERILE IRR 250ML POUR (IV SOLUTION) ×3 IMPLANT
WATER STERILE IRR 500ML POUR (IV SOLUTION) IMPLANT
WIRE CRE 18-20MM 8CM F G (MISCELLANEOUS) IMPLANT

## 2015-09-24 NOTE — Transfer of Care (Signed)
Immediate Anesthesia Transfer of Care Note  Patient: Linsy Baldassari Navarette  Procedure(s) Performed: Procedure(s) with comments: COLONOSCOPY WITH random colon byopies. (N/A) ESOPHAGOGASTRODUODENOSCOPY (EGD)  (N/A) POLYPECTOMY INTESTINAL - cecal polyp ascending polyp  Patient Location: PACU  Anesthesia Type: MAC  Level of Consciousness: awake, alert  and patient cooperative  Airway and Oxygen Therapy: Patient Spontanous Breathing and Patient connected to supplemental oxygen  Post-op Assessment: Post-op Vital signs reviewed, Patient's Cardiovascular Status Stable, Respiratory Function Stable, Patent Airway and No signs of Nausea or vomiting  Post-op Vital Signs: Reviewed and stable  Complications: No apparent anesthesia complications

## 2015-09-24 NOTE — Anesthesia Postprocedure Evaluation (Signed)
Anesthesia Post Note  Patient: Joyce [name]  Procedure(s) Performed: Procedure(s) (LRB): COLONOSCOPY WITH random colon byopies. (N/A) ESOPHAGOGASTRODUODENOSCOPY (EGD)  (N/A) POLYPECTOMY INTESTINAL  Patient location during evaluation: PACU Anesthesia Type: MAC Level of consciousness: awake and alert Pain management: pain level controlled Vital Signs Assessment: post-procedure vital signs reviewed and stable Respiratory status: spontaneous breathing, nonlabored ventilation and respiratory function stable Cardiovascular status: stable and blood pressure returned to baseline Anesthetic complications: no    Trecia Rogers

## 2015-09-24 NOTE — Anesthesia Procedure Notes (Signed)
Procedure Name: MAC Performed by: Neala Miggins Pre-anesthesia Checklist: Patient identified, Emergency Drugs available, Suction available, Timeout performed and Patient being monitored Patient Re-evaluated:Patient Re-evaluated prior to inductionOxygen Delivery Method: Nasal cannula Placement Confirmation: positive ETCO2       

## 2015-09-24 NOTE — Op Note (Signed)
[facility] Patient Name: Joyce [name] Procedure Date: 09/24/2015 11:48 AM MRN: [id number] Account #: [id number] Date of Birth: [birth year] Admit Type: Outpatient Age: 70 Room: [facility] OR ROOM 01 Gender: Female Note Status: Finalized Procedure:            Colonoscopy Indications:          Chronic diarrhea Providers:            Lucilla Lame, MD Referring MD:         Cletis Athens, MD (Referring MD) Medicines:            Propofol per Anesthesia Complications:        No immediate complications. Procedure:            Pre-Anesthesia Assessment:                       - Prior to the procedure, a History and Physical was                        performed, and patient medications and allergies were                        reviewed. The patient's tolerance of previous                        anesthesia was also reviewed. The risks and benefits of                        the procedure and the sedation options and risks were                        discussed with the patient. All questions were                        answered, and informed consent was obtained. Prior                        Anticoagulants: The patient has taken no previous                        anticoagulant or antiplatelet agents. ASA Grade                        Assessment: II - A patient with mild systemic disease.                        After reviewing the risks and benefits, the patient was                        deemed in satisfactory condition to undergo the                        procedure.                       After obtaining informed consent, the colonoscope was                        passed under direct vision. Throughout the procedure,  the patient's blood pressure, pulse, and oxygen                        saturations were monitored continuously. The Olympus                        CF-HQ190L Colonoscope (S#. [id number]) was introduced                        through the anus  and advanced to the the cecum,                        identified by appendiceal orifice and ileocecal valve.                        The colonoscopy was performed without difficulty. The                        patient tolerated the procedure well. The quality of                        the bowel preparation was excellent. Findings:      The perianal and digital rectal examinations were normal.      A 3 mm polyp was found in the cecum. The polyp was sessile. The polyp       was removed with a cold snare. Resection and retrieval were complete.      A 7 mm polyp was found in the ascending colon. The polyp was sessile.       The polyp was removed with a cold snare. Resection and retrieval were       complete.      Multiple small-mouthed diverticula were found in the sigmoid colon.      The terminal ileum appeared normal. Biopsies were taken with a cold       forceps for histology.      Random biopsies were obtained with cold forceps for histology randomly       in the entire colon. Impression:           - One 3 mm polyp in the cecum, removed with a cold                        snare. Resected and retrieved.                       - One 7 mm polyp in the ascending colon, removed with a                        cold snare. Resected and retrieved.                       - Diverticulosis in the sigmoid colon.                       - The examined portion of the ileum was normal.                        Biopsied.                       - Random biopsies were obtained in the  entire colon. Recommendation:       - Repeat colonoscopy in 5 years if polyp adenoma and 10                        years if hyperplastic Procedure Code(s):    --- Professional ---                       [phone], Colonoscopy, flexible; with removal of tumor(s),                        polyp(s), or other lesion(s) by snare technique                       [id number], 26, Colonoscopy, flexible; with biopsy, single                        or  multiple Diagnosis Code(s):    --- Professional ---                       K52.9, Noninfective gastroenteritis and colitis,                        unspecified                       D12.0, Benign neoplasm of cecum                       D12.2, Benign neoplasm of ascending colon CPT copyright 2016 American Medical Association. All rights reserved. The codes documented in this report are preliminary and upon coder review may  be revised to meet current compliance requirements. Lucilla Lame, MD 09/24/2015 12:25:08 PM This report has been signed electronically. Number of Addenda: 0 Note Initiated On: 09/24/2015 11:48 AM Scope Withdrawal Time: 0 hours 10 minutes 17 seconds  Total Procedure Duration: 0 hours 13 minutes 27 seconds       [facility]

## 2015-09-24 NOTE — Op Note (Signed)
[facility] Gastroenterology Patient Name: Joyce [name] Procedure Date: 09/24/2015 11:49 AM MRN: [id number] Account #: [id number] Date of Birth: [birth year] Admit Type: Outpatient Age: 70 Room: [facility] OR ROOM 01 Gender: Female Note Status: Finalized Procedure:            Upper GI endoscopy Indications:          Diarrhea, Nausea Providers:            Lucilla Lame, MD Referring MD:         Cletis Athens, MD (Referring MD) Medicines:            Propofol per Anesthesia Complications:        No immediate complications. Procedure:            Pre-Anesthesia Assessment:                       - Prior to the procedure, a History and Physical was                        performed, and patient medications and allergies were                        reviewed. The patient's tolerance of previous                        anesthesia was also reviewed. The risks and benefits of                        the procedure and the sedation options and risks were                        discussed with the patient. All questions were                        answered, and informed consent was obtained. Prior                        Anticoagulants: The patient has taken no previous                        anticoagulant or antiplatelet agents. ASA Grade                        Assessment: II - A patient with mild systemic disease.                        After reviewing the risks and benefits, the patient was                        deemed in satisfactory condition to undergo the                        procedure.                       After obtaining informed consent, the endoscope was                        passed under direct vision. Throughout the procedure,  the patient's blood pressure, pulse, and oxygen                        saturations were monitored continuously. The Olympus                        [id number]-HQ190 Endoscope (S#. [id number]) was introduced                        through the  mouth, and advanced to the second part of                        duodenum. The upper GI endoscopy was accomplished                        without difficulty. The patient tolerated the procedure                        well. Findings:      The Z-line was irregular and was found at the gastroesophageal junction.      A few sessile polyps with no bleeding and no stigmata of recent bleeding       were found in the gastric fundus. The polyp was removed with a cold       biopsy forceps. Resection and retrieval were complete.      The examined duodenum was normal. Biopsies were taken with a cold       forceps for histology. Impression:           - Z-line irregular, at the gastroesophageal junction.                       - A few gastric polyps. Resected and retrieved.                       - Normal examined duodenum. Biopsied. Recommendation:       - Await pathology results. Procedure Code(s):    --- Professional ---                       [phone], Esophagogastroduodenoscopy, flexible, transoral;                        with biopsy, single or multiple Diagnosis Code(s):    --- Professional ---                       R19.7, Diarrhea, unspecified                       R11.0, Nausea                       K31.7, Polyp of stomach and duodenum                       K22.8, Other specified diseases of esophagus CPT copyright 2016 American Medical Association. All rights reserved. The codes documented in this report are preliminary and upon coder review may  be revised to meet current compliance requirements. Lucilla Lame, MD 09/24/2015 12:05:09 PM This report has been signed electronically. Number of Addenda: 0 Note Initiated On: 09/24/2015 11:49 AM Total Procedure Duration: 0 hours 3 minutes 15 seconds  [facility]

## 2015-09-24 NOTE — Anesthesia Preprocedure Evaluation (Signed)
Anesthesia Evaluation  Patient identified by MRN, date of birth, ID band Patient awake    Reviewed: Allergy & Precautions, H&P , NPO status , Patient's Chart, lab work & pertinent test results, reviewed documented beta blocker date and time   History of Anesthesia Complications (+) PONV and history of anesthetic complications  Airway Mallampati: II  TM Distance: >3 FB Neck ROM: full    Dental no notable dental hx.    Pulmonary neg pulmonary ROS,    Pulmonary exam normal breath sounds clear to auscultation       Cardiovascular Exercise Tolerance: Good hypertension, Normal cardiovascular exam Rhythm:regular Rate:Normal     Neuro/Psych  Headaches, PSYCHIATRIC DISORDERS    GI/Hepatic Neg liver ROS, hiatal hernia, GERD  ,  Endo/Other  negative endocrine ROS  Renal/GU negative Renal ROS  negative genitourinary   Musculoskeletal   Abdominal   Peds  Hematology negative hematology ROS (+)   Anesthesia Other Findings   Reproductive/Obstetrics negative OB ROS                             Anesthesia Physical Anesthesia Plan  ASA: II  Anesthesia Plan: MAC   Post-op Pain Management:    Induction: Intravenous  Airway Management Planned: Nasal Cannula  Additional Equipment:   Intra-op Plan:   Post-operative Plan:   Informed Consent: I have reviewed the patients History and Physical, chart, labs and discussed the procedure including the risks, benefits and alternatives for the proposed anesthesia with the patient or authorized representative who has indicated his/her understanding and acceptance.   Dental Advisory Given  Plan Discussed with: CRNA  Anesthesia Plan Comments:         Anesthesia Quick Evaluation

## 2015-09-24 NOTE — H&P (Signed)
[facility]  [address] [place], [place] [address] Phone: [phone] Fax : [phone]  Primary Care Physician:  Cletis Athens, MD Primary Gastroenterologist:  Dr. Allen Norris  Pre-Procedure History & Physical: HPI:  Joyce [name] is a 70 y.o. female is here for an endoscopy and colonoscopy.   Past Medical History  Diagnosis Date  . Colon polyps   . Diverticulitis   . CKD (chronic kidney disease)     Dr Holley Raring [name] 4  . Diverticulitis   . Anal fissure   . Vertigo   . Pancreatitis     Valproic acid  . Bipolar affective disorder ([place])   . Hypertension   . Heart murmur   . Family history of adverse reaction to anesthesia     mom - PONV  . GERD (gastroesophageal reflux disease)   . History of hiatal hernia   . Headache     migraines - none over 10 yrs    Past Surgical History  Procedure Laterality Date  . Cholecystectomy  2003  . Eye surgery    . Colonoscopy  2014    Dr. Jamal Collin  . Abdominal hysterectomy  1990 ?  Marland Kitchen Av fistula placement  4/30  . Breast excisional biopsy Left 1994    neg surgical bx  . Peripheral vascular catheterization N/A 05/29/2015    Procedure: A/V Shuntogram/Fistulagram;  Surgeon: Katha Cabal, MD;  Location: [place] [facility];  Service: Cardiovascular;  Laterality: N/A;  . Peripheral vascular catheterization N/A 05/29/2015    Procedure: A/V Shunt Intervention;  Surgeon: Katha Cabal, MD;  Location: [place] [facility];  Service: Cardiovascular;  Laterality: N/A;    Prior to Admission medications   Medication Sig Start Date End Date Taking? Authorizing Provider  acetaminophen (TYLENOL) 325 MG tablet Take 650 mg by mouth every 4 (four) hours as needed for mild pain.   Yes Historical Provider, MD  carbamazepine (TEGRETOL XR) 200 MG 12 hr tablet Take 200 mg by mouth 2 (two) times daily.   Yes Historical Provider, MD  cholecalciferol (VITAMIN D) 1000 UNITS tablet Take 1,000 Units by mouth daily.   Yes Historical  Provider, MD  Dexlansoprazole (DEXILANT PO) Take by mouth daily.   Yes Historical Provider, MD  lisinopril (PRINIVIL,ZESTRIL) 20 MG tablet Take 20 mg by mouth 2 (two) times daily.  04/19/13  Yes Historical Provider, MD  polyethylene glycol powder (GLYCOLAX/MIRALAX) powder Take 1 Container by mouth daily. Reported on 09/19/2015   Yes Historical Provider, MD  QUEtiapine (SEROQUEL) 25 MG tablet Take 25 mg by mouth at bedtime. She takes two 25mg  tabs at bedtime   Yes Historical Provider, MD  verapamil (CALAN-SR) 240 MG CR tablet Take 240 mg by mouth at bedtime.  04/19/13  Yes Historical Provider, MD  Cyanocobalamin (B-12 COMPLIANCE INJECTION IJ) Inject as directed every 30 (thirty) days.    Historical Provider, MD    Allergies as of 09/19/2015 - Review Complete 09/19/2015  Allergen Reaction Noted  . Indomethacin Hives 05/10/2013  . Pollen extract Other (See Comments) 12/25/2014    Family History  Problem Relation Age of Onset  . Stroke Father   . Hypertension Father   . Colon cancer Neg Hx   . Liver disease Neg Hx     Social History   Social History  . Marital Status: Single    Spouse Name: N/A  . Number of Children: 1  . Years of Education: N/A   Occupational History  . [employer]  Social History Main Topics  . Smoking status: Never Smoker   . Smokeless tobacco: Never Used  . Alcohol Use: No  . Drug Use: No  . Sexual Activity: Not on file   Other Topics Concern  . 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 Neck:  Supple; no masses or thyromegaly. Lungs:  Clear throughout to auscultation.    Heart:  Regular rate  and rhythm. Abdomen:  Soft, nontender and nondistended. Normal bowel sounds, without guarding, and without rebound.   Neurologic:  Alert and  oriented x4;  grossly normal neurologically.  Impression/Plan: Joyce [name] is here for an endoscopy and colonoscopy to be performed for diarrhea and nausea.  Risks, benefits, limitations, and alternatives regarding  endoscopy and colonoscopy have been reviewed with the patient.  Questions have been answered.  All parties agreeable.   Ollen Bowl, MD  09/24/2015, 10:33 AM

## 2015-09-25 ENCOUNTER — Other Ambulatory Visit: Payer: Self-pay

## 2015-09-25 ENCOUNTER — Encounter: Payer: Self-pay | Admitting: Gastroenterology

## 2015-09-25 DIAGNOSIS — I10 Essential (primary) hypertension: Secondary | ICD-10-CM | POA: Diagnosis not present

## 2015-09-25 DIAGNOSIS — K2100 Gastro-esophageal reflux disease with esophagitis, without bleeding: Secondary | ICD-10-CM

## 2015-09-25 DIAGNOSIS — N185 Chronic kidney disease, stage 5: Secondary | ICD-10-CM | POA: Diagnosis not present

## 2015-09-25 DIAGNOSIS — N2581 Secondary hyperparathyroidism of renal origin: Secondary | ICD-10-CM | POA: Diagnosis not present

## 2015-09-25 DIAGNOSIS — D631 Anemia in chronic kidney disease: Secondary | ICD-10-CM | POA: Diagnosis not present

## 2015-09-25 DIAGNOSIS — Z79899 Other long term (current) drug therapy: Secondary | ICD-10-CM | POA: Diagnosis not present

## 2015-09-25 MED ORDER — DEXLANSOPRAZOLE 60 MG PO CPDR: 60.0000 mg | DELAYED_RELEASE_CAPSULE | Freq: Every day | ORAL | Status: DC

## 2015-09-26 ENCOUNTER — Other Ambulatory Visit: Payer: Self-pay

## 2015-09-26 ENCOUNTER — Ambulatory Visit: Payer: Commercial Managed Care - HMO | Admitting: Gastroenterology

## 2015-09-26 DIAGNOSIS — K2100 Gastro-esophageal reflux disease with esophagitis, without bleeding: Secondary | ICD-10-CM

## 2015-09-26 MED ORDER — PANTOPRAZOLE SODIUM 40 MG PO TBEC: 40.0000 mg | DELAYED_RELEASE_TABLET | Freq: Every day | ORAL | Status: DC

## 2015-10-22 ENCOUNTER — Ambulatory Visit: Payer: Commercial Managed Care - HMO | Admitting: Gastroenterology

## 2015-10-23 DIAGNOSIS — N184 Chronic kidney disease, stage 4 (severe): Secondary | ICD-10-CM | POA: Diagnosis not present

## 2015-10-23 DIAGNOSIS — M545 Low back pain: Secondary | ICD-10-CM | POA: Diagnosis not present

## 2015-10-23 DIAGNOSIS — Z8659 Personal history of other mental and behavioral disorders: Secondary | ICD-10-CM | POA: Diagnosis not present

## 2015-10-23 DIAGNOSIS — D649 Anemia, unspecified: Secondary | ICD-10-CM | POA: Diagnosis not present

## 2015-11-14 DIAGNOSIS — F3177 Bipolar disorder, in partial remission, most recent episode mixed: Secondary | ICD-10-CM | POA: Diagnosis not present

## 2015-11-15 DIAGNOSIS — I10 Essential (primary) hypertension: Secondary | ICD-10-CM | POA: Diagnosis not present

## 2015-11-15 DIAGNOSIS — D631 Anemia in chronic kidney disease: Secondary | ICD-10-CM | POA: Diagnosis not present

## 2015-11-15 DIAGNOSIS — Z0000 Encounter for general adult medical examination without abnormal findings: Secondary | ICD-10-CM | POA: Diagnosis not present

## 2015-11-15 DIAGNOSIS — T82858A Stenosis of vascular prosthetic devices, implants and grafts, initial encounter: Secondary | ICD-10-CM | POA: Diagnosis not present

## 2015-11-15 DIAGNOSIS — Y841 Kidney dialysis as the cause of abnormal reaction of the patient, or of later complication, without mention of misadventure at the time of the procedure: Secondary | ICD-10-CM | POA: Diagnosis not present

## 2015-11-15 DIAGNOSIS — T82318A Breakdown (mechanical) of other vascular grafts, initial encounter: Secondary | ICD-10-CM | POA: Diagnosis not present

## 2015-11-15 DIAGNOSIS — E785 Hyperlipidemia, unspecified: Secondary | ICD-10-CM | POA: Diagnosis not present

## 2015-11-15 DIAGNOSIS — N184 Chronic kidney disease, stage 4 (severe): Secondary | ICD-10-CM | POA: Diagnosis not present

## 2015-11-15 DIAGNOSIS — N186 End stage renal disease: Secondary | ICD-10-CM | POA: Diagnosis not present

## 2015-11-15 DIAGNOSIS — Z992 Dependence on renal dialysis: Secondary | ICD-10-CM | POA: Diagnosis not present

## 2015-11-15 DIAGNOSIS — N2581 Secondary hyperparathyroidism of renal origin: Secondary | ICD-10-CM | POA: Diagnosis not present

## 2015-11-15 DIAGNOSIS — N185 Chronic kidney disease, stage 5: Secondary | ICD-10-CM | POA: Diagnosis not present

## 2015-11-15 DIAGNOSIS — Q613 Polycystic kidney, unspecified: Secondary | ICD-10-CM | POA: Diagnosis not present

## 2015-12-20 DIAGNOSIS — Q613 Polycystic kidney, unspecified: Secondary | ICD-10-CM | POA: Diagnosis not present

## 2015-12-20 DIAGNOSIS — N185 Chronic kidney disease, stage 5: Secondary | ICD-10-CM | POA: Diagnosis not present

## 2015-12-20 DIAGNOSIS — I10 Essential (primary) hypertension: Secondary | ICD-10-CM | POA: Diagnosis not present

## 2015-12-20 DIAGNOSIS — E559 Vitamin D deficiency, unspecified: Secondary | ICD-10-CM | POA: Diagnosis not present

## 2016-01-05 ENCOUNTER — Emergency Department
Admission: EM | Admit: 2016-01-05 | Discharge: 2016-01-05 | Disposition: A | Discharge status: Home / Self Care | Payer: Commercial Managed Care - HMO | Attending: Emergency Medicine | Admitting: Emergency Medicine

## 2016-01-05 ENCOUNTER — Other Ambulatory Visit: Payer: Self-pay

## 2016-01-05 ENCOUNTER — Encounter: Payer: Self-pay | Admitting: Emergency Medicine

## 2016-01-05 DIAGNOSIS — Z79899 Other long term (current) drug therapy: Secondary | ICD-10-CM | POA: Diagnosis not present

## 2016-01-05 DIAGNOSIS — R0789 Other chest pain: Secondary | ICD-10-CM | POA: Diagnosis not present

## 2016-01-05 DIAGNOSIS — F319 Bipolar disorder, unspecified: Secondary | ICD-10-CM | POA: Diagnosis not present

## 2016-01-05 DIAGNOSIS — R42 Dizziness and giddiness: Secondary | ICD-10-CM | POA: Insufficient documentation

## 2016-01-05 DIAGNOSIS — N184 Chronic kidney disease, stage 4 (severe): Secondary | ICD-10-CM | POA: Diagnosis not present

## 2016-01-05 DIAGNOSIS — I129 Hypertensive chronic kidney disease with stage 1 through stage 4 chronic kidney disease, or unspecified chronic kidney disease: Secondary | ICD-10-CM | POA: Diagnosis not present

## 2016-01-05 LAB — COMPREHENSIVE METABOLIC PANEL
ALBUMIN: 4.4 g/dL (ref 3.5–5.0)
ALT: 10 U/L — AB (ref 14–54)
AST: 15 U/L (ref 15–41)
Alkaline Phosphatase: 60 U/L (ref 38–126)
Anion gap: 10 (ref 5–15)
BUN: 60 mg/dL — AB (ref 6–20)
CHLORIDE: 108 mmol/L (ref 101–111)
CO2: 20 mmol/L — ABNORMAL LOW (ref 22–32)
Calcium: 9.3 mg/dL (ref 8.9–10.3)
Creatinine, Ser: 3.3 mg/dL — ABNORMAL HIGH (ref 0.44–1.00)
GFR calc Af Amer: 15 mL/min — ABNORMAL LOW (ref >60)
GFR calc non Af Amer: 13 mL/min — ABNORMAL LOW (ref >60)
GLUCOSE: 126 mg/dL — AB (ref 65–99)
POTASSIUM: 4.1 mmol/L (ref 3.5–5.1)
Sodium: 138 mmol/L (ref 135–145)
Total Bilirubin: 0.7 mg/dL (ref 0.3–1.2)
Total Protein: 7.4 g/dL (ref 6.5–8.1)

## 2016-01-05 LAB — CBC
HEMATOCRIT: 32.8 % — AB (ref 35.0–47.0)
Hemoglobin: 10.9 g/dL — ABNORMAL LOW (ref 12.0–16.0)
MCH: 30.7 pg (ref 26.0–34.0)
MCHC: 33.3 g/dL (ref 32.0–36.0)
MCV: 92.1 fL (ref 80.0–100.0)
Platelets: 279 10*3/uL (ref 150–440)
RBC: 3.56 MIL/uL — ABNORMAL LOW (ref 3.80–5.20)
RDW: 13.5 % (ref 11.5–14.5)
WBC: 6.2 10*3/uL (ref 3.6–11.0)

## 2016-01-05 LAB — TROPONIN I

## 2016-01-05 LAB — LIPASE, BLOOD: LIPASE: 55 U/L — AB (ref 11–51)

## 2016-01-05 MED ORDER — ONDANSETRON HCL 4 MG/2ML IJ SOLN
4.0000 mg | Freq: Once | INTRAMUSCULAR | Status: AC
  Administered 2016-01-05: 4 mg via INTRAVENOUS
  Filled 2016-01-05: qty 2

## 2016-01-05 MED ORDER — GI COCKTAIL ~~LOC~~
30.0000 mL | Freq: Once | ORAL | Status: AC
  Administered 2016-01-05: 30 mL via ORAL
  Filled 2016-01-05: qty 30

## 2016-01-05 MED ORDER — SODIUM CHLORIDE 0.9 % IV BOLUS (SEPSIS)
1000.0000 mL | Freq: Once | INTRAVENOUS | Status: AC
  Administered 2016-01-05: 1000 mL via INTRAVENOUS

## 2016-01-05 MED ORDER — ONDANSETRON 4 MG PO TBDP: 4.0000 mg | ORAL_TABLET | Freq: Three times a day (TID) | ORAL | Status: DC | PRN

## 2016-01-05 NOTE — ED Notes (Signed)
States had choking sensation beginning at noon today at work. States feels nauseated and abdomen hurts.

## 2016-01-05 NOTE — Discharge Instructions (Signed)

## 2016-01-05 NOTE — ED Provider Notes (Signed)
[facility] Emergency Department Provider Note  Time seen: 5:08 PM  I have reviewed the triage vital signs and the nursing notes.   HISTORY  Chief Complaint Abdominal Pain    HPI Joyce [name] is a 70 y.o. female with a past medical history of CK D, bipolar, hypertension, presents to the emergency department with dizziness and chest discomfort. According to the patient around 12 PM today she began feeling dizzy and lightheaded. She states she has been burping a lot and feels like she has indigestion in her upper abdomen/middle of her chest. States she is tried to make herself vomit but it did not work. Denies diarrhea. Niacin the abdominal pain at this time. States she no longer feels lightheaded. Describes the chest discomfort as "indigestion" and mild currently.     Past Medical History  Diagnosis Date  . Colon polyps   . Diverticulitis   . CKD (chronic kidney disease)     Dr Holley Raring [name] 4  . Diverticulitis   . Anal fissure   . Vertigo   . Pancreatitis     Valproic acid  . Bipolar affective disorder ([place])   . Hypertension   . Heart murmur   . Family history of adverse reaction to anesthesia     mom - PONV  . GERD (gastroesophageal reflux disease)   . History of hiatal hernia   . Headache     migraines - none over 10 yrs    Patient Active Problem List   Diagnosis Date Noted  . Noninfectious diarrhea   . Benign neoplasm of cecum   . Benign neoplasm of ascending colon   . Diarrhea   . Nausea   . Gastric polyp   . Chronic constipation 12/27/2014  . Nausea with vomiting 12/27/2014  . Left sided abdominal pain 12/27/2014  . Personal history of colonic polyps 05/10/2013    Past Surgical History  Procedure Laterality Date  . Cholecystectomy  2003  . Eye surgery    . Colonoscopy  2014    Dr. Jamal Collin  . Abdominal hysterectomy  1990 ?  Marland Kitchen Av fistula placement  4/30  . Breast excisional biopsy Left 1994    neg surgical bx  .  Peripheral vascular catheterization N/A 05/29/2015    Procedure: A/V Shuntogram/Fistulagram;  Surgeon: Katha Cabal, MD;  Location: [place] [facility];  Service: Cardiovascular;  Laterality: N/A;  . Peripheral vascular catheterization N/A 05/29/2015    Procedure: A/V Shunt Intervention;  Surgeon: Katha Cabal, MD;  Location: [place] [facility];  Service: Cardiovascular;  Laterality: N/A;  . Colonoscopy with propofol N/A 09/24/2015    Procedure: COLONOSCOPY WITH random colon byopies.;  Surgeon: Lucilla Lame, MD;  Location: [place];  Service: Endoscopy;  Laterality: N/A;  . Esophagogastroduodenoscopy (egd) with propofol N/A 09/24/2015    Procedure: ESOPHAGOGASTRODUODENOSCOPY (EGD) ;  Surgeon: Lucilla Lame, MD;  Location: [place];  Service: Endoscopy;  Laterality: N/A;  . Polypectomy  09/24/2015    Procedure: POLYPECTOMY INTESTINAL;  Surgeon: Lucilla Lame, MD;  Location: [place];  Service: Endoscopy;;  cecal polyp ascending polyp    Current Outpatient Rx  Name  Route  Sig  Dispense  Refill  . acetaminophen (TYLENOL) 325 MG tablet   Oral   Take 650 mg by mouth every 4 (four) hours as needed for mild pain.         . carbamazepine (TEGRETOL XR) 200 MG 12 hr tablet   Oral  Take 200 mg by mouth 2 (two) times daily.         . cholecalciferol (VITAMIN D) 1000 UNITS tablet   Oral   Take 1,000 Units by mouth daily.         . Cyanocobalamin (B-12 COMPLIANCE INJECTION IJ)   Injection   Inject as directed every 30 (thirty) days.         Marland Kitchen lisinopril (PRINIVIL,ZESTRIL) 20 MG tablet   Oral   Take 20 mg by mouth 2 (two) times daily.          . pantoprazole (PROTONIX) 40 MG tablet   Oral   Take 1 tablet (40 mg total) by mouth daily.   90 tablet   3   . polyethylene glycol powder (GLYCOLAX/MIRALAX) powder   Oral   Take 1 Container by mouth daily. Reported on 09/19/2015         . QUEtiapine (SEROQUEL) 25 MG tablet   Oral   Take 25 mg by  mouth at bedtime. She takes two 25mg  tabs at bedtime         . verapamil (CALAN-SR) 240 MG CR tablet   Oral   Take 240 mg by mouth at bedtime.            Allergies Indomethacin and Pollen extract  Family History  Problem Relation Age of Onset  . Stroke Father   . Hypertension Father   . Colon cancer Neg Hx   . Liver disease Neg Hx     Social History Social History  Substance Use Topics  . Smoking status: Never Smoker   . Smokeless tobacco: Never Used  . Alcohol Use: No    Review of Systems Constitutional: Negative for fever Cardiovascular: Mild chest discomfort Respiratory: Negative for shortness of breath. Gastrointestinal: Negative for abdominal pain, vomiting and diarrhea. Genitourinary: Negative for dysuria. Neurological: Negative for headache 10-point ROS otherwise negative.  ____________________________________________   PHYSICAL EXAM:  VITAL SIGNS: ED Triage Vitals  Enc Vitals Group     BP 01/05/16 1403 164/63 mmHg     Pulse Rate 01/05/16 1403 71     Resp 01/05/16 1403 20     Temp 01/05/16 1403 97.6 F (36.4 C)     Temp Source 01/05/16 1403 Oral     SpO2 01/05/16 1403 100 %     Weight 01/05/16 1403 184 lb (83.462 kg)     Height 01/05/16 1403 5\' 3"  (1.6 m)     Head Cir --      Peak Flow --      Pain Score 01/05/16 1403 8     Pain Loc --      Pain Edu? --      Excl. in [place]? --     Constitutional: Alert and oriented. Well appearing and in no distress. Eyes: Normal exam ENT   Head: Normocephalic and atraumatic.   Mouth/Throat: Mucous membranes are moist. Cardiovascular: Normal rate, regular rhythm. No murmur Respiratory: Normal respiratory effort without tachypnea nor retractions. Breath sounds are clear  Gastrointestinal: Soft and nontender. No distention.   Musculoskeletal: Nontender with normal range of motion in all extremities. Neurologic:  Normal speech and language. No gross focal neurologic deficits  Skin:  Skin is warm, dry  and intact.  Psychiatric: Mood and affect are normal.   ____________________________________________    EKG  EKG reviewed and interpreted by myself shows normal sinus rhythm at 73 bpm, narrow QRS, normal axis, normal intervals, nonspecific but no concerning ST changes.  ____________________________________________  INITIAL IMPRESSION / ASSESSMENT AND PLAN / ED COURSE  Pertinent labs & imaging results that were available during my care of the patient were reviewed by me and considered in my medical decision making (see chart for details).  The patient presents to the emergency department with dizziness/lightheadedness around 12 PM today which is largely resolved. Patient also states chest discomfort which she describes as indigestion, states she has been burping all day. Patient's labs are largely at her baseline creatinine is largely unchanged from last year, lipase is largely added space line. Troponin is negative. We'll repeat a troponin now that it has been 5+ hours since the event. We'll dose a liter fluid due to the dizziness earlier today, as well as a GI cocktail to see if it helps with her indigestion symptoms.  Patient's second troponin is negative. Patient states she feels better after the GI cocktail. We'll discharge with Zofran as needed, and PCP follow-up. Patient agreeable to plan.  ____________________________________________   FINAL CLINICAL IMPRESSION(S) / ED DIAGNOSES  Chest discomfort Dizziness   Harvest Dark, MD 01/05/16 [phone]

## 2016-01-07 DIAGNOSIS — R079 Chest pain, unspecified: Secondary | ICD-10-CM | POA: Diagnosis not present

## 2016-01-07 DIAGNOSIS — R1083 Colic: Secondary | ICD-10-CM | POA: Diagnosis not present

## 2016-01-07 DIAGNOSIS — N184 Chronic kidney disease, stage 4 (severe): Secondary | ICD-10-CM | POA: Diagnosis not present

## 2016-01-11 DIAGNOSIS — N2581 Secondary hyperparathyroidism of renal origin: Secondary | ICD-10-CM | POA: Diagnosis not present

## 2016-01-11 DIAGNOSIS — Z8659 Personal history of other mental and behavioral disorders: Secondary | ICD-10-CM | POA: Diagnosis not present

## 2016-01-11 DIAGNOSIS — N184 Chronic kidney disease, stage 4 (severe): Secondary | ICD-10-CM | POA: Diagnosis not present

## 2016-01-11 DIAGNOSIS — D631 Anemia in chronic kidney disease: Secondary | ICD-10-CM | POA: Diagnosis not present

## 2016-01-11 DIAGNOSIS — I10 Essential (primary) hypertension: Secondary | ICD-10-CM | POA: Diagnosis not present

## 2016-01-11 DIAGNOSIS — Q613 Polycystic kidney, unspecified: Secondary | ICD-10-CM | POA: Diagnosis not present

## 2016-01-11 DIAGNOSIS — J3089 Other allergic rhinitis: Secondary | ICD-10-CM | POA: Diagnosis not present

## 2016-01-11 DIAGNOSIS — M545 Low back pain: Secondary | ICD-10-CM | POA: Diagnosis not present

## 2016-02-04 ENCOUNTER — Other Ambulatory Visit: Payer: Self-pay | Admitting: Internal Medicine

## 2016-02-04 DIAGNOSIS — Z1231 Encounter for screening mammogram for malignant neoplasm of breast: Secondary | ICD-10-CM

## 2016-02-11 DIAGNOSIS — F3177 Bipolar disorder, in partial remission, most recent episode mixed: Secondary | ICD-10-CM | POA: Diagnosis not present

## 2016-02-22 DIAGNOSIS — D631 Anemia in chronic kidney disease: Secondary | ICD-10-CM | POA: Diagnosis not present

## 2016-02-22 DIAGNOSIS — N185 Chronic kidney disease, stage 5: Secondary | ICD-10-CM | POA: Diagnosis not present

## 2016-02-22 DIAGNOSIS — I10 Essential (primary) hypertension: Secondary | ICD-10-CM | POA: Diagnosis not present

## 2016-02-22 DIAGNOSIS — N184 Chronic kidney disease, stage 4 (severe): Secondary | ICD-10-CM | POA: Diagnosis not present

## 2016-02-22 DIAGNOSIS — N2581 Secondary hyperparathyroidism of renal origin: Secondary | ICD-10-CM | POA: Diagnosis not present

## 2016-02-27 ENCOUNTER — Ambulatory Visit: Admission: RE | Admit: 2016-02-27 | Payer: Commercial Managed Care - HMO | Source: Ambulatory Visit

## 2016-03-03 DIAGNOSIS — Y841 Kidney dialysis as the cause of abnormal reaction of the patient, or of later complication, without mention of misadventure at the time of the procedure: Secondary | ICD-10-CM | POA: Diagnosis not present

## 2016-03-03 DIAGNOSIS — Z992 Dependence on renal dialysis: Secondary | ICD-10-CM | POA: Diagnosis not present

## 2016-03-03 DIAGNOSIS — N186 End stage renal disease: Secondary | ICD-10-CM | POA: Diagnosis not present

## 2016-03-03 DIAGNOSIS — I10 Essential (primary) hypertension: Secondary | ICD-10-CM | POA: Diagnosis not present

## 2016-03-03 DIAGNOSIS — E785 Hyperlipidemia, unspecified: Secondary | ICD-10-CM | POA: Diagnosis not present

## 2016-03-03 DIAGNOSIS — T82858A Stenosis of vascular prosthetic devices, implants and grafts, initial encounter: Secondary | ICD-10-CM | POA: Diagnosis not present

## 2016-03-03 DIAGNOSIS — N185 Chronic kidney disease, stage 5: Secondary | ICD-10-CM | POA: Diagnosis not present

## 2016-03-03 DIAGNOSIS — T82318A Breakdown (mechanical) of other vascular grafts, initial encounter: Secondary | ICD-10-CM | POA: Diagnosis not present

## 2016-03-14 DIAGNOSIS — H401132 Primary open-angle glaucoma, bilateral, moderate stage: Secondary | ICD-10-CM | POA: Diagnosis not present

## 2016-04-01 DIAGNOSIS — H401133 Primary open-angle glaucoma, bilateral, severe stage: Secondary | ICD-10-CM | POA: Diagnosis not present

## 2016-04-03 DIAGNOSIS — D631 Anemia in chronic kidney disease: Secondary | ICD-10-CM | POA: Diagnosis not present

## 2016-04-03 DIAGNOSIS — N2581 Secondary hyperparathyroidism of renal origin: Secondary | ICD-10-CM | POA: Diagnosis not present

## 2016-04-03 DIAGNOSIS — N185 Chronic kidney disease, stage 5: Secondary | ICD-10-CM | POA: Diagnosis not present

## 2016-04-03 DIAGNOSIS — I10 Essential (primary) hypertension: Secondary | ICD-10-CM | POA: Diagnosis not present

## 2016-04-10 DIAGNOSIS — N185 Chronic kidney disease, stage 5: Secondary | ICD-10-CM | POA: Diagnosis not present

## 2016-04-10 DIAGNOSIS — E559 Vitamin D deficiency, unspecified: Secondary | ICD-10-CM | POA: Diagnosis not present

## 2016-04-10 DIAGNOSIS — I10 Essential (primary) hypertension: Secondary | ICD-10-CM | POA: Diagnosis not present

## 2016-04-10 DIAGNOSIS — Q613 Polycystic kidney, unspecified: Secondary | ICD-10-CM | POA: Diagnosis not present

## 2016-04-11 DIAGNOSIS — K5732 Diverticulitis of large intestine without perforation or abscess without bleeding: Secondary | ICD-10-CM | POA: Diagnosis not present

## 2016-04-11 DIAGNOSIS — D649 Anemia, unspecified: Secondary | ICD-10-CM | POA: Diagnosis not present

## 2016-04-11 DIAGNOSIS — Z23 Encounter for immunization: Secondary | ICD-10-CM | POA: Diagnosis not present

## 2016-04-11 DIAGNOSIS — Z8659 Personal history of other mental and behavioral disorders: Secondary | ICD-10-CM | POA: Diagnosis not present

## 2016-04-11 DIAGNOSIS — N184 Chronic kidney disease, stage 4 (severe): Secondary | ICD-10-CM | POA: Diagnosis not present

## 2016-04-11 DIAGNOSIS — M545 Low back pain: Secondary | ICD-10-CM | POA: Diagnosis not present

## 2016-05-14 DIAGNOSIS — F3177 Bipolar disorder, in partial remission, most recent episode mixed: Secondary | ICD-10-CM | POA: Diagnosis not present

## 2016-05-16 DIAGNOSIS — D631 Anemia in chronic kidney disease: Secondary | ICD-10-CM | POA: Diagnosis not present

## 2016-05-16 DIAGNOSIS — N185 Chronic kidney disease, stage 5: Secondary | ICD-10-CM | POA: Diagnosis not present

## 2016-05-16 DIAGNOSIS — Z79899 Other long term (current) drug therapy: Secondary | ICD-10-CM | POA: Diagnosis not present

## 2016-05-16 DIAGNOSIS — N2581 Secondary hyperparathyroidism of renal origin: Secondary | ICD-10-CM | POA: Diagnosis not present

## 2016-05-16 DIAGNOSIS — I10 Essential (primary) hypertension: Secondary | ICD-10-CM | POA: Diagnosis not present

## 2016-06-04 DIAGNOSIS — H401132 Primary open-angle glaucoma, bilateral, moderate stage: Secondary | ICD-10-CM | POA: Diagnosis not present

## 2016-06-10 DIAGNOSIS — D631 Anemia in chronic kidney disease: Secondary | ICD-10-CM | POA: Diagnosis not present

## 2016-06-10 DIAGNOSIS — N2581 Secondary hyperparathyroidism of renal origin: Secondary | ICD-10-CM | POA: Diagnosis not present

## 2016-06-10 DIAGNOSIS — N185 Chronic kidney disease, stage 5: Secondary | ICD-10-CM | POA: Diagnosis not present

## 2016-06-10 DIAGNOSIS — I10 Essential (primary) hypertension: Secondary | ICD-10-CM | POA: Diagnosis not present

## 2016-06-11 DIAGNOSIS — H401131 Primary open-angle glaucoma, bilateral, mild stage: Secondary | ICD-10-CM | POA: Diagnosis not present

## 2016-07-06 DIAGNOSIS — M79605 Pain in left leg: Secondary | ICD-10-CM | POA: Diagnosis not present

## 2016-07-06 DIAGNOSIS — M7062 Trochanteric bursitis, left hip: Secondary | ICD-10-CM | POA: Diagnosis not present

## 2016-07-17 DIAGNOSIS — N2581 Secondary hyperparathyroidism of renal origin: Secondary | ICD-10-CM | POA: Diagnosis not present

## 2016-07-17 DIAGNOSIS — D631 Anemia in chronic kidney disease: Secondary | ICD-10-CM | POA: Diagnosis not present

## 2016-07-17 DIAGNOSIS — N185 Chronic kidney disease, stage 5: Secondary | ICD-10-CM | POA: Diagnosis not present

## 2016-07-17 DIAGNOSIS — I10 Essential (primary) hypertension: Secondary | ICD-10-CM | POA: Diagnosis not present

## 2016-07-22 ENCOUNTER — Other Ambulatory Visit: Payer: Self-pay | Admitting: Internal Medicine

## 2016-07-22 ENCOUNTER — Ambulatory Visit
Admission: RE | Admit: 2016-07-22 | Discharge: 2016-07-22 | Disposition: A | Discharge status: Home / Self Care | Payer: Medicare HMO | Source: Ambulatory Visit | Attending: Internal Medicine | Admitting: Internal Medicine

## 2016-07-22 DIAGNOSIS — M545 Low back pain: Secondary | ICD-10-CM | POA: Diagnosis not present

## 2016-07-22 DIAGNOSIS — M25552 Pain in left hip: Secondary | ICD-10-CM | POA: Diagnosis present

## 2016-07-22 DIAGNOSIS — M1612 Unilateral primary osteoarthritis, left hip: Secondary | ICD-10-CM | POA: Diagnosis not present

## 2016-07-22 DIAGNOSIS — L570 Actinic keratosis: Secondary | ICD-10-CM | POA: Diagnosis not present

## 2016-07-22 DIAGNOSIS — M25551 Pain in right hip: Secondary | ICD-10-CM

## 2016-07-22 DIAGNOSIS — M5440 Lumbago with sciatica, unspecified side: Secondary | ICD-10-CM

## 2016-07-22 DIAGNOSIS — M1712 Unilateral primary osteoarthritis, left knee: Secondary | ICD-10-CM | POA: Diagnosis not present

## 2016-07-22 DIAGNOSIS — I10 Essential (primary) hypertension: Secondary | ICD-10-CM | POA: Diagnosis not present

## 2016-07-22 DIAGNOSIS — M47816 Spondylosis without myelopathy or radiculopathy, lumbar region: Secondary | ICD-10-CM | POA: Diagnosis not present

## 2016-07-22 DIAGNOSIS — R631 Polydipsia: Secondary | ICD-10-CM | POA: Diagnosis not present

## 2016-07-25 DIAGNOSIS — M545 Low back pain: Secondary | ICD-10-CM | POA: Diagnosis not present

## 2016-07-25 DIAGNOSIS — D649 Anemia, unspecified: Secondary | ICD-10-CM | POA: Diagnosis not present

## 2016-07-25 DIAGNOSIS — I10 Essential (primary) hypertension: Secondary | ICD-10-CM | POA: Diagnosis not present

## 2016-07-25 DIAGNOSIS — N184 Chronic kidney disease, stage 4 (severe): Secondary | ICD-10-CM | POA: Diagnosis not present

## 2016-07-25 DIAGNOSIS — J3089 Other allergic rhinitis: Secondary | ICD-10-CM | POA: Diagnosis not present

## 2016-07-28 ENCOUNTER — Encounter: Payer: Self-pay | Admitting: Emergency Medicine

## 2016-07-28 ENCOUNTER — Inpatient Hospital Stay
Admission: EM | Admit: 2016-07-28 | Discharge: 2016-07-31 | DRG: 690 | Disposition: A | Discharge status: Home Health | Payer: Medicare HMO | Attending: Internal Medicine | Admitting: Internal Medicine

## 2016-07-28 ENCOUNTER — Emergency Department: Payer: Medicare HMO

## 2016-07-28 DIAGNOSIS — R319 Hematuria, unspecified: Secondary | ICD-10-CM

## 2016-07-28 DIAGNOSIS — R310 Gross hematuria: Secondary | ICD-10-CM | POA: Diagnosis present

## 2016-07-28 DIAGNOSIS — Z823 Family history of stroke: Secondary | ICD-10-CM

## 2016-07-28 DIAGNOSIS — N2881 Hypertrophy of kidney: Secondary | ICD-10-CM | POA: Diagnosis not present

## 2016-07-28 DIAGNOSIS — D631 Anemia in chronic kidney disease: Secondary | ICD-10-CM | POA: Diagnosis present

## 2016-07-28 DIAGNOSIS — E872 Acidosis: Secondary | ICD-10-CM | POA: Diagnosis not present

## 2016-07-28 DIAGNOSIS — Z91048 Other nonmedicinal substance allergy status: Secondary | ICD-10-CM | POA: Diagnosis not present

## 2016-07-28 DIAGNOSIS — N390 Urinary tract infection, site not specified: Principal | ICD-10-CM | POA: Diagnosis present

## 2016-07-28 DIAGNOSIS — Z8249 Family history of ischemic heart disease and other diseases of the circulatory system: Secondary | ICD-10-CM

## 2016-07-28 DIAGNOSIS — N289 Disorder of kidney and ureter, unspecified: Secondary | ICD-10-CM

## 2016-07-28 DIAGNOSIS — I120 Hypertensive chronic kidney disease with stage 5 chronic kidney disease or end stage renal disease: Secondary | ICD-10-CM | POA: Diagnosis not present

## 2016-07-28 DIAGNOSIS — I959 Hypotension, unspecified: Secondary | ICD-10-CM | POA: Diagnosis present

## 2016-07-28 DIAGNOSIS — F319 Bipolar disorder, unspecified: Secondary | ICD-10-CM | POA: Diagnosis present

## 2016-07-28 DIAGNOSIS — Z888 Allergy status to other drugs, medicaments and biological substances status: Secondary | ICD-10-CM | POA: Diagnosis not present

## 2016-07-28 DIAGNOSIS — Z66 Do not resuscitate: Secondary | ICD-10-CM | POA: Diagnosis present

## 2016-07-28 DIAGNOSIS — K5730 Diverticulosis of large intestine without perforation or abscess without bleeding: Secondary | ICD-10-CM | POA: Diagnosis present

## 2016-07-28 DIAGNOSIS — Q613 Polycystic kidney, unspecified: Secondary | ICD-10-CM | POA: Diagnosis not present

## 2016-07-28 DIAGNOSIS — K219 Gastro-esophageal reflux disease without esophagitis: Secondary | ICD-10-CM | POA: Diagnosis present

## 2016-07-28 DIAGNOSIS — N2581 Secondary hyperparathyroidism of renal origin: Secondary | ICD-10-CM | POA: Diagnosis present

## 2016-07-28 DIAGNOSIS — I700 Atherosclerosis of aorta: Secondary | ICD-10-CM | POA: Diagnosis present

## 2016-07-28 DIAGNOSIS — R809 Proteinuria, unspecified: Secondary | ICD-10-CM | POA: Diagnosis not present

## 2016-07-28 DIAGNOSIS — R262 Difficulty in walking, not elsewhere classified: Secondary | ICD-10-CM

## 2016-07-28 DIAGNOSIS — I10 Essential (primary) hypertension: Secondary | ICD-10-CM | POA: Diagnosis not present

## 2016-07-28 DIAGNOSIS — N185 Chronic kidney disease, stage 5: Secondary | ICD-10-CM | POA: Diagnosis present

## 2016-07-28 DIAGNOSIS — R109 Unspecified abdominal pain: Secondary | ICD-10-CM | POA: Diagnosis not present

## 2016-07-28 DIAGNOSIS — B9620 Unspecified Escherichia coli [E. coli] as the cause of diseases classified elsewhere: Secondary | ICD-10-CM | POA: Diagnosis present

## 2016-07-28 DIAGNOSIS — N179 Acute kidney failure, unspecified: Secondary | ICD-10-CM | POA: Diagnosis not present

## 2016-07-28 LAB — URINALYSIS, COMPLETE (UACMP) WITH MICROSCOPIC
Specific Gravity, Urine: 1.013 (ref 1.005–1.030)
Squamous Epithelial / LPF: NONE SEEN

## 2016-07-28 LAB — CBC
HEMATOCRIT: 30.2 % — AB (ref 35.0–47.0)
Hemoglobin: 10.2 g/dL — ABNORMAL LOW (ref 12.0–16.0)
MCH: 30.5 pg (ref 26.0–34.0)
MCHC: 34 g/dL (ref 32.0–36.0)
MCV: 89.8 fL (ref 80.0–100.0)
PLATELETS: 412 10*3/uL (ref 150–440)
RBC: 3.36 MIL/uL — AB (ref 3.80–5.20)
RDW: 13.8 % (ref 11.5–14.5)
WBC: 17.1 10*3/uL — AB (ref 3.6–11.0)

## 2016-07-28 LAB — COMPREHENSIVE METABOLIC PANEL
ALT: 17 U/L (ref 14–54)
AST: 22 U/L (ref 15–41)
Albumin: 3.5 g/dL (ref 3.5–5.0)
Alkaline Phosphatase: 82 U/L (ref 38–126)
Anion gap: 12 (ref 5–15)
BILIRUBIN TOTAL: 0.9 mg/dL (ref 0.3–1.2)
BUN: 91 mg/dL — AB (ref 6–20)
CO2: 18 mmol/L — ABNORMAL LOW (ref 22–32)
CREATININE: 5.5 mg/dL — AB (ref 0.44–1.00)
Calcium: 9.1 mg/dL (ref 8.9–10.3)
Chloride: 105 mmol/L (ref 101–111)
GFR, EST AFRICAN AMERICAN: 8 mL/min — AB (ref >60)
GFR, EST NON AFRICAN AMERICAN: 7 mL/min — AB (ref >60)
Glucose, Bld: 113 mg/dL — ABNORMAL HIGH (ref 65–99)
POTASSIUM: 4 mmol/L (ref 3.5–5.1)
Sodium: 135 mmol/L (ref 135–145)
TOTAL PROTEIN: 7.7 g/dL (ref 6.5–8.1)

## 2016-07-28 LAB — HEMOGLOBIN: HEMOGLOBIN: 9 g/dL — AB (ref 12.0–16.0)

## 2016-07-28 LAB — GLUCOSE, CAPILLARY: GLUCOSE-CAPILLARY: 117 mg/dL — AB (ref 65–99)

## 2016-07-28 MED ORDER — TRAMADOL HCL 50 MG PO TABS: 50.0000 mg | ORAL_TABLET | Freq: Four times a day (QID) | ORAL | Status: DC | PRN

## 2016-07-28 MED ORDER — SODIUM CHLORIDE 0.9% FLUSH: 3.0000 mL | Freq: Two times a day (BID) | INTRAVENOUS | Status: DC

## 2016-07-28 MED ORDER — VERAPAMIL HCL ER 240 MG PO TBCR
240.0000 mg | EXTENDED_RELEASE_TABLET | Freq: Every day | ORAL | Status: DC
  Administered 2016-07-28 – 2016-07-30 (×3): 240 mg via ORAL
  Filled 2016-07-28 (×3): qty 1

## 2016-07-28 MED ORDER — VITAMIN D 1000 UNITS PO TABS
1000.0000 [IU] | ORAL_TABLET | Freq: Every day | ORAL | Status: DC
  Administered 2016-07-28 – 2016-07-31 (×4): 1000 [IU] via ORAL
  Filled 2016-07-28 (×4): qty 1

## 2016-07-28 MED ORDER — ONDANSETRON HCL 4 MG PO TABS: 4.0000 mg | ORAL_TABLET | Freq: Four times a day (QID) | ORAL | Status: DC | PRN

## 2016-07-28 MED ORDER — CARBAMAZEPINE ER 200 MG PO TB12
200.0000 mg | ORAL_TABLET | Freq: Two times a day (BID) | ORAL | Status: DC
  Administered 2016-07-28 – 2016-07-31 (×6): 200 mg via ORAL
  Filled 2016-07-28 (×8): qty 1

## 2016-07-28 MED ORDER — CYANOCOBALAMIN 1000 MCG/ML IJ SOLN: 1000.0000 ug | INTRAMUSCULAR | Status: DC

## 2016-07-28 MED ORDER — ACETAMINOPHEN 325 MG PO TABS: 650.0000 mg | ORAL_TABLET | ORAL | Status: DC | PRN

## 2016-07-28 MED ORDER — BISACODYL 5 MG PO TBEC: 5.0000 mg | DELAYED_RELEASE_TABLET | Freq: Every day | ORAL | Status: DC | PRN

## 2016-07-28 MED ORDER — FLEET ENEMA 7-19 GM/118ML RE ENEM: 1.0000 | ENEMA | Freq: Once | RECTAL | Status: DC | PRN

## 2016-07-28 MED ORDER — GABAPENTIN 100 MG PO CAPS
100.0000 mg | ORAL_CAPSULE | Freq: Every day | ORAL | Status: DC
  Administered 2016-07-28 – 2016-07-30 (×3): 100 mg via ORAL
  Filled 2016-07-28 (×3): qty 1

## 2016-07-28 MED ORDER — IOPAMIDOL (ISOVUE-300) INJECTION 61%: 30.0000 mL | Freq: Once | INTRAVENOUS | Status: DC | PRN

## 2016-07-28 MED ORDER — KETOROLAC TROMETHAMINE 15 MG/ML IJ SOLN: 15.0000 mg | Freq: Four times a day (QID) | INTRAMUSCULAR | Status: DC | PRN

## 2016-07-28 MED ORDER — SODIUM CHLORIDE 0.9 % IV BOLUS (SEPSIS): 1000.0000 mL | Freq: Once | INTRAVENOUS | Status: DC

## 2016-07-28 MED ORDER — CEFTRIAXONE SODIUM-DEXTROSE 1-3.74 GM-% IV SOLR
1.0000 g | INTRAVENOUS | Status: DC
  Administered 2016-07-28 – 2016-07-30 (×3): 1 g via INTRAVENOUS
  Filled 2016-07-28 (×4): qty 50

## 2016-07-28 MED ORDER — PANTOPRAZOLE SODIUM 40 MG PO TBEC
40.0000 mg | DELAYED_RELEASE_TABLET | Freq: Every day | ORAL | Status: DC
  Administered 2016-07-28 – 2016-07-31 (×4): 40 mg via ORAL
  Filled 2016-07-28 (×4): qty 1

## 2016-07-28 MED ORDER — QUETIAPINE FUMARATE 25 MG PO TABS
50.0000 mg | ORAL_TABLET | Freq: Every day | ORAL | Status: DC
  Administered 2016-07-28 – 2016-07-30 (×3): 50 mg via ORAL
  Filled 2016-07-28 (×3): qty 2

## 2016-07-28 MED ORDER — SODIUM CHLORIDE 0.9 % IV SOLN
Freq: Once | INTRAVENOUS | Status: AC
  Administered 2016-07-28: 15:00:00 via INTRAVENOUS

## 2016-07-28 MED ORDER — SENNOSIDES-DOCUSATE SODIUM 8.6-50 MG PO TABS
1.0000 | ORAL_TABLET | Freq: Every evening | ORAL | Status: DC | PRN
  Administered 2016-07-28: 1 via ORAL
  Filled 2016-07-28: qty 1

## 2016-07-28 MED ORDER — TIMOLOL MALEATE 0.5 % OP SOLN
1.0000 [drp] | Freq: Two times a day (BID) | OPHTHALMIC | Status: DC
  Administered 2016-07-28 – 2016-07-31 (×6): 1 [drp] via OPHTHALMIC
  Filled 2016-07-28: qty 5

## 2016-07-28 MED ORDER — SODIUM CHLORIDE 0.9 % IV SOLN
INTRAVENOUS | Status: DC
  Administered 2016-07-29 – 2016-07-30 (×3): via INTRAVENOUS

## 2016-07-28 MED ORDER — ONDANSETRON HCL 4 MG/2ML IJ SOLN: 4.0000 mg | Freq: Four times a day (QID) | INTRAMUSCULAR | Status: DC | PRN

## 2016-07-28 MED ORDER — LATANOPROST 0.005 % OP SOLN
1.0000 [drp] | Freq: Every day | OPHTHALMIC | Status: DC
  Administered 2016-07-28 – 2016-07-30 (×3): 1 [drp] via OPHTHALMIC
  Filled 2016-07-28: qty 2.5

## 2016-07-28 MED ORDER — ACETAMINOPHEN 650 MG RE SUPP: 650.0000 mg | Freq: Four times a day (QID) | RECTAL | Status: DC | PRN

## 2016-07-28 MED ORDER — SODIUM CHLORIDE 0.9 % IV BOLUS (SEPSIS)
250.0000 mL | Freq: Once | INTRAVENOUS | Status: AC
  Administered 2016-07-28: 250 mL via INTRAVENOUS

## 2016-07-28 MED ORDER — ACETAMINOPHEN 325 MG PO TABS
650.0000 mg | ORAL_TABLET | Freq: Four times a day (QID) | ORAL | Status: DC | PRN
  Administered 2016-07-28 – 2016-07-30 (×3): 650 mg via ORAL
  Filled 2016-07-28 (×4): qty 2

## 2016-07-28 NOTE — ED Notes (Signed)
Patient in [place] room.  VS reassessed.  No new complaints.  Given warm blankets.

## 2016-07-28 NOTE — Progress Notes (Signed)
Family Meeting Note  Advance Directive:no  Today a meeting took place with the Patient.and sister    The following clinical team members were present during this meeting:MD  The following were discussed:Patient's diagnosis: AKI on CKD hematuria , Patient's progosis: > 12 months and Goals for treatment: DNR  Additional follow-up to be provided: Patient has living will set up but does not have advanced rectus.  Time spent during discussion:16 minutes  Filemon Breton, MD

## 2016-07-28 NOTE — H&P (Signed)
[facility] NAME: Joyce [name]    MR#:  [id number]  DATE OF BIRTH:  [birth year]  DATE OF ADMISSION:  07/28/2016  PRIMARY CARE PHYSICIAN: Cletis Athens, MD   REQUESTING/REFERRING PHYSICIAN: dr Archie Balboa  CHIEF COMPLAINT:    hematuria HISTORY OF PRESENT ILLNESS:  Joyce [name]  is a 71 y.o. female with a known history of Chronic kidney disease stage IV and polycystic kidney disease  who reports blood in her urine since Friday.  She is currently followed by nephrology for polycystic kidney disease. I spoke with Dr. Holley Raring who recommended low-dose IV fluids. Patient underwent CT scan of the kidneys to evaluate for rupture of the cyst due to hematuria. PAST MEDICAL HISTORY:   Past Medical History:  Diagnosis Date  . Anal fissure   . Bipolar affective disorder ([place])   . CKD (chronic kidney disease)    Dr Holley Raring [name] 4  . Colon polyps   . Diverticulitis   . Diverticulitis   . Family history of adverse reaction to anesthesia    mom - PONV  . GERD (gastroesophageal reflux disease)   . Headache    migraines - none over 10 yrs  . Heart murmur   . History of hiatal hernia   . Hypertension   . Pancreatitis    Valproic acid  . Vertigo     PAST SURGICAL HISTORY:   Past Surgical History:  Procedure Laterality Date  . ABDOMINAL HYSTERECTOMY  1990 ?  . AV FISTULA PLACEMENT  4/30  . BREAST EXCISIONAL BIOPSY Left 1994   neg surgical bx  . CHOLECYSTECTOMY  2003  . COLONOSCOPY  2014   Dr. Jamal Collin  . COLONOSCOPY WITH PROPOFOL N/A 09/24/2015   Procedure: COLONOSCOPY WITH random colon byopies.;  Surgeon: Lucilla Lame, MD;  Location: [place];  Service: Endoscopy;  Laterality: N/A;  . ESOPHAGOGASTRODUODENOSCOPY (EGD) WITH PROPOFOL N/A 09/24/2015   Procedure: ESOPHAGOGASTRODUODENOSCOPY (EGD) ;  Surgeon: Lucilla Lame, MD;  Location: [place];  Service: Endoscopy;  Laterality: N/A;  . EYE SURGERY    . PERIPHERAL VASCULAR  CATHETERIZATION N/A 05/29/2015   Procedure: A/V Shuntogram/Fistulagram;  Surgeon: Katha Cabal, MD;  Location: [place] [facility];  Service: Cardiovascular;  Laterality: N/A;  . PERIPHERAL VASCULAR CATHETERIZATION N/A 05/29/2015   Procedure: A/V Shunt Intervention;  Surgeon: Katha Cabal, MD;  Location: [place] [facility];  Service: Cardiovascular;  Laterality: N/A;  . POLYPECTOMY  09/24/2015   Procedure: POLYPECTOMY INTESTINAL;  Surgeon: Lucilla Lame, MD;  Location: [place];  Service: Endoscopy;;  cecal polyp ascending polyp    SOCIAL HISTORY:   Social History  Substance Use Topics  . Smoking status: Never Smoker  . Smokeless tobacco: Never Used  . Alcohol use No    FAMILY HISTORY:   Family History  Problem Relation Age of Onset  . Stroke Father   . Hypertension Father   . Colon cancer Neg Hx   . Liver disease Neg Hx     DRUG ALLERGIES:   Allergies  Allergen Reactions  . Indomethacin Hives  . Pollen Extract Other (See Comments)    Sinus problems and HA    REVIEW OF SYSTEMS:   Review of Systems  Constitutional: Negative.  Negative for chills, fever and malaise/fatigue.  HENT: Negative.  Negative for ear discharge, ear pain, hearing loss, nosebleeds and sore throat.   Eyes: Negative.  Negative for blurred vision and pain.  Respiratory: Negative.  Negative for  cough, hemoptysis, shortness of breath and wheezing.   Cardiovascular: Negative.  Negative for chest pain, palpitations and leg swelling.  Gastrointestinal: Negative.  Negative for abdominal pain, blood in stool, diarrhea, nausea and vomiting.  Genitourinary: Positive for hematuria. Negative for dysuria.  Musculoskeletal: Negative.  Negative for back pain.  Skin: Negative.   Neurological: Negative for dizziness, tremors, speech change, focal weakness, seizures and headaches.  Endo/Heme/Allergies: Negative.  Does not bruise/bleed easily.  Psychiatric/Behavioral: Negative.  Negative for  depression, hallucinations and suicidal ideas.    MEDICATIONS AT HOME:   Prior to Admission medications   Medication Sig Start Date End Date Taking? Authorizing Provider  acetaminophen (TYLENOL) 325 MG tablet Take 650 mg by mouth every 4 (four) hours as needed for mild pain.   Yes Historical Provider, MD  bimatoprost (LUMIGAN) 0.01 % SOLN Place 1 drop into both eyes at bedtime.   Yes Historical Provider, MD  carbamazepine (TEGRETOL XR) 200 MG 12 hr tablet Take 200 mg by mouth 2 (two) times daily.   Yes Historical Provider, MD  cyanocobalamin (,VITAMIN B-12,) 1000 MCG/ML injection Inject 1,000 mcg into the muscle every 30 (thirty) days.   Yes Historical Provider, MD  gabapentin (NEURONTIN) 100 MG capsule Take 100 mg by mouth at bedtime.   Yes Historical Provider, MD  lisinopril (PRINIVIL,ZESTRIL) 20 MG tablet Take 20 mg by mouth 2 (two) times daily.  04/19/13  Yes Historical Provider, MD  pantoprazole (PROTONIX) 40 MG tablet Take 1 tablet (40 mg total) by mouth daily. 09/26/15  Yes Lucilla Lame, MD  QUEtiapine (SEROQUEL) 25 MG tablet Take 50 mg by mouth at bedtime. She takes two 25mg  tabs at bedtime    Yes Historical Provider, MD  timolol (TIMOPTIC) 0.5 % ophthalmic solution Place 1 drop into both eyes 2 (two) times daily.   Yes Historical Provider, MD  verapamil (CALAN-SR) 240 MG CR tablet Take 240 mg by mouth at bedtime.  04/19/13  Yes Historical Provider, MD  cholecalciferol (VITAMIN D) 1000 UNITS tablet Take 1,000 Units by mouth daily.    Historical Provider, MD      VITAL SIGNS:  Blood pressure (!) 112/49, pulse 84, temperature 98.9 F (37.2 C), temperature source Oral, resp. rate 18, height 5\' 3"  (1.6 m), weight 79.8 kg (176 lb), SpO2 100 %.  PHYSICAL EXAMINATION:   Physical Exam  Constitutional: She is oriented to person, place, and time and well-developed, well-nourished, and in no distress. No distress.  HENT:  Head: Normocephalic.  Eyes: No scleral icterus.  Neck: Normal range of  motion. Neck supple. No JVD present. No tracheal deviation present.  Cardiovascular: Normal rate, regular rhythm and normal heart sounds.  Exam reveals no gallop and no friction rub.   No murmur heard. Pulmonary/Chest: Effort normal and breath sounds normal. No respiratory distress. She has no wheezes. She has no rales. She exhibits no tenderness.  Abdominal: Soft. Bowel sounds are normal. She exhibits no distension and no mass. There is no tenderness. There is no rebound and no guarding.  Musculoskeletal: Normal range of motion. She exhibits no edema.  Neurological: She is alert and oriented to person, place, and time.  Skin: Skin is warm. No rash noted. No erythema.  Psychiatric: Affect and judgment normal.      LABORATORY PANEL:   CBC  Recent Labs Lab 07/28/16 0956  WBC 17.1*  HGB 10.2*  HCT 30.2*  PLT 412   ------------------------------------------------------------------------------------------------------------------  Chemistries   Recent Labs Lab 07/28/16 0956  NA 135  K  4.0  CL 105  CO2 18*  GLUCOSE 113*  BUN 91*  CREATININE 5.50*  CALCIUM 9.1  AST 22  ALT 17  ALKPHOS 82  BILITOT 0.9   ------------------------------------------------------------------------------------------------------------------  Cardiac Enzymes No results for input(s): TROPONINI in the last 168 hours. ------------------------------------------------------------------------------------------------------------------  RADIOLOGY:  No results found.  EKG:     IMPRESSION AND PLAN:   71 year old female with polycystic kidney disease and chronic stage IV who presents with hematuria and worsening creatinine.  1. Acute on chronic kidney disease stage IV: Case discussed with Dr. Holley Raring Start IV fluids at 60 cc an hour Hold nephrotoxic agents and repeat BMP in a.m.  2. Hematuria: Follow-up on CT scan Follow hemoglobin  3. Hypertension: Hold lisinopril for now Continue  verapamil  4. Bipolar affective disorder: Continue Seroquel and Tegretol  5. GERD: Continue PPI  All the records are reviewed and case discussed with ED provider. Management plans discussed with the patient and she is in agreement  CODE STATUS: DNR   TOTAL TIME TAKING CARE OF THIS PATIENT: 45 minutes.    Bonnye Halle M.D on 07/28/2016 at 2:34 PM  Between 7am to 6pm - Pager - [phone]  After 6pm go to [email or web address] - password [email or web address] [facility]  Office  [phone]  CC: Primary care physician; Cletis Athens, MD

## 2016-07-28 NOTE — ED Provider Notes (Signed)
[facility] - Inpatient Emergency Department Provider Note    ____________________________________________   I have reviewed the triage vital signs and the nursing notes.   HISTORY  Chief Complaint Hematuria   History limited by: Not Limited   HPI Aja Whitehair is a 71 y.o. female who presents to the emergency department today because of concerns for hematuria. The symptoms started 2 days ago. She did notice red blood in her urine. The patient has had some associated right flank pain with this. She did that the urine was starting to light. she has not noticed any bad odor with urine. there has been a little discomfort with pain. patient does not have any fevers. no nausea vomiting. patient does have chronic kidney disease secondary to polycystic kidney disease. is not currently on dialysis.   Past Medical History:  Diagnosis Date  . Anal fissure   . Bipolar affective disorder ([place])   . CKD (chronic kidney disease)    Dr Holley Raring [name] 4  . Colon polyps   . Diverticulitis   . Diverticulitis   . Family history of adverse reaction to anesthesia    mom - PONV  . GERD (gastroesophageal reflux disease)   . Headache    migraines - none over 10 yrs  . Heart murmur   . History of hiatal hernia   . Hypertension   . Pancreatitis    Valproic acid  . Vertigo     Patient Active Problem List   Diagnosis Date Noted  . Noninfectious diarrhea   . Benign neoplasm of cecum   . Benign neoplasm of ascending colon   . Diarrhea   . Nausea   . Gastric polyp   . Chronic constipation 12/27/2014  . Nausea with vomiting 12/27/2014  . Left sided abdominal pain 12/27/2014  . Personal history of colonic polyps 05/10/2013    Past Surgical History:  Procedure Laterality Date  . ABDOMINAL HYSTERECTOMY  1990 ?  . AV FISTULA PLACEMENT  4/30  . BREAST EXCISIONAL BIOPSY Left 1994   neg surgical bx  . CHOLECYSTECTOMY  2003  . COLONOSCOPY  2014   Dr. Jamal Collin  . COLONOSCOPY WITH  PROPOFOL N/A 09/24/2015   Procedure: COLONOSCOPY WITH random colon byopies.;  Surgeon: Lucilla Lame, MD;  Location: [place];  Service: Endoscopy;  Laterality: N/A;  . ESOPHAGOGASTRODUODENOSCOPY (EGD) WITH PROPOFOL N/A 09/24/2015   Procedure: ESOPHAGOGASTRODUODENOSCOPY (EGD) ;  Surgeon: Lucilla Lame, MD;  Location: [place];  Service: Endoscopy;  Laterality: N/A;  . EYE SURGERY    . PERIPHERAL VASCULAR CATHETERIZATION N/A 05/29/2015   Procedure: A/V Shuntogram/Fistulagram;  Surgeon: Katha Cabal, MD;  Location: [place] [facility];  Service: Cardiovascular;  Laterality: N/A;  . PERIPHERAL VASCULAR CATHETERIZATION N/A 05/29/2015   Procedure: A/V Shunt Intervention;  Surgeon: Katha Cabal, MD;  Location: [place] [facility];  Service: Cardiovascular;  Laterality: N/A;  . POLYPECTOMY  09/24/2015   Procedure: POLYPECTOMY INTESTINAL;  Surgeon: Lucilla Lame, MD;  Location: [place];  Service: Endoscopy;;  cecal polyp ascending polyp    Prior to Admission medications   Medication Sig Start Date End Date Taking? Authorizing Provider  acetaminophen (TYLENOL) 325 MG tablet Take 650 mg by mouth every 4 (four) hours as needed for mild pain.    Historical Provider, MD  carbamazepine (TEGRETOL XR) 200 MG 12 hr tablet Take 200 mg by mouth 2 (two) times daily.    Historical Provider, MD  cholecalciferol (VITAMIN D) 1000 UNITS tablet Take  1,000 Units by mouth daily.    Historical Provider, MD  Cyanocobalamin (B-12 COMPLIANCE INJECTION IJ) Inject as directed every 30 (thirty) days.    Historical Provider, MD  lisinopril (PRINIVIL,ZESTRIL) 20 MG tablet Take 20 mg by mouth 2 (two) times daily.  04/19/13   Historical Provider, MD  ondansetron (ZOFRAN ODT) 4 MG disintegrating tablet Take 1 tablet (4 mg total) by mouth every 8 (eight) hours as needed for nausea or vomiting. 01/05/16   Harvest Dark, MD  pantoprazole (PROTONIX) 40 MG tablet Take 1 tablet (40 mg total) by mouth  daily. 09/26/15   Lucilla Lame, MD  polyethylene glycol powder (GLYCOLAX/MIRALAX) powder Take 1 Container by mouth daily. Reported on 09/19/2015    Historical Provider, MD  QUEtiapine (SEROQUEL) 25 MG tablet Take 25 mg by mouth at bedtime. She takes two 25mg  tabs at bedtime    Historical Provider, MD  verapamil (CALAN-SR) 240 MG CR tablet Take 240 mg by mouth at bedtime.  04/19/13   Historical Provider, MD    Allergies Indomethacin and Pollen extract  Family History  Problem Relation Age of Onset  . Stroke Father   . Hypertension Father   . Colon cancer Neg Hx   . Liver disease Neg Hx     Social History Social History  Substance Use Topics  . Smoking status: Never Smoker  . Smokeless tobacco: Never Used  . Alcohol use No    Review of Systems  Constitutional: Negative for fever. Cardiovascular: Negative for chest pain. Respiratory: Negative for shortness of breath. Gastrointestinal: Right sided abdominal pain. Genitourinary: Negative for dysuria. Musculoskeletal: Negative for back pain. Skin: Negative for rash. Neurological: Negative for headaches, focal weakness or numbness.  10-point ROS otherwise negative.  ____________________________________________   PHYSICAL EXAM:  VITAL SIGNS: ED Triage Vitals  Enc Vitals Group     BP 07/28/16 0957 114/69     Pulse Rate 07/28/16 0957 88     Resp 07/28/16 0957 20     Temp 07/28/16 0957 98.1 F (36.7 C)     Temp Source 07/28/16 0957 Oral     SpO2 07/28/16 0957 100 %     Weight 07/28/16 1001 176 lb (79.8 kg)     Height 07/28/16 1001 5\' 3"  (1.6 m)     Head Circumference --      Peak Flow --      Pain Score 07/28/16 1001 8    Constitutional: Alert and oriented. Well appearing and in no distress. Eyes: Conjunctivae are normal. Normal extraocular movements. ENT   Head: Normocephalic and atraumatic.   Nose: No congestion/rhinnorhea.   Mouth/Throat: Mucous membranes are moist.   Neck: No  stridor. Hematological/Lymphatic/Immunilogical: No cervical lymphadenopathy. Cardiovascular: Normal rate, regular rhythm.  No murmurs, rubs, or gallops.  Respiratory: Normal respiratory effort without tachypnea nor retractions. Breath sounds are clear and equal bilaterally. No wheezes/rales/rhonchi. Gastrointestinal: Soft and non tender. No rebound. No guarding.  Genitourinary: Deferred Musculoskeletal: Normal range of motion in all extremities. No lower extremity edema. Neurologic:  Normal speech and language. No gross focal neurologic deficits are appreciated.  Skin:  Skin is warm, dry and intact. No rash noted. Psychiatric: Mood and affect are normal. Speech and behavior are normal. Patient exhibits appropriate insight and judgment.  ____________________________________________    LABS (pertinent positives/negatives)  Labs Reviewed  URINALYSIS, COMPLETE (UACMP) WITH MICROSCOPIC - Abnormal; Notable for the following:       Result Value   Color, Urine RED (*)    APPearance BLOODY (*)  Glucose, UA   (*)    Value: TEST NOT REPORTED DUE TO COLOR INTERFERENCE OF URINE PIGMENT   Hgb urine dipstick   (*)    Value: TEST NOT REPORTED DUE TO COLOR INTERFERENCE OF URINE PIGMENT   Bilirubin Urine   (*)    Value: TEST NOT REPORTED DUE TO COLOR INTERFERENCE OF URINE PIGMENT   Ketones, ur   (*)    Value: TEST NOT REPORTED DUE TO COLOR INTERFERENCE OF URINE PIGMENT   Protein, ur   (*)    Value: TEST NOT REPORTED DUE TO COLOR INTERFERENCE OF URINE PIGMENT   Nitrite   (*)    Value: TEST NOT REPORTED DUE TO COLOR INTERFERENCE OF URINE PIGMENT   Leukocytes, UA   (*)    Value: TEST NOT REPORTED DUE TO COLOR INTERFERENCE OF URINE PIGMENT   Bacteria, UA MANY (*)    All other components within normal limits  COMPREHENSIVE METABOLIC PANEL - Abnormal; Notable for the following:    CO2 18 (*)    Glucose, Bld 113 (*)    BUN 91 (*)    Creatinine, Ser 5.50 (*)    GFR calc non Af Amer 7 (*)    GFR  calc Af Amer 8 (*)    All other components within normal limits  CBC - Abnormal; Notable for the following:    WBC 17.1 (*)    RBC 3.36 (*)    Hemoglobin 10.2 (*)    HCT 30.2 (*)    All other components within normal limits     ____________________________________________   EKG  None  ____________________________________________    RADIOLOGY  CT renal   IMPRESSION:  Polycystic kidney disease with enlarged kidneys bilaterally  containing innumerable cysts. Most of the cysts are low density, but  some are associated with hemorrhage. There is no sign of  hydronephrosis or passing stone. No evidence of renal inflammation.    Aortic atherosclerosis.    Left colon diverticulosis without evidence of diverticulitis.     ____________________________________________   PROCEDURES  Procedures  ____________________________________________   INITIAL IMPRESSION / ASSESSMENT AND PLAN / ED COURSE  Pertinent labs & imaging results that were available during my care of the patient were reviewed by me and considered in my medical decision making (see chart for details).  Patient presented to the emergency department today because of concerns for hematuria. Patient has a history of polycystic kidney disease. Patient's creatinine was elevated over baseline today. I discussed with Dr. Holley Raring who recommended CT scan. Will plan on admission to the hospital service.  ____________________________________________   FINAL CLINICAL IMPRESSION(S) / ED DIAGNOSES  Final diagnoses:  Right flank pain  Hematuria   Note: This dictation was prepared with Dragon dictation. Any transcriptional errors that result from this process are unintentional     Nance Pear, MD 07/28/16 [phone]

## 2016-07-28 NOTE — ED Triage Notes (Signed)
Pt reports blood in urine since Friday, reports redish/brown urine. Pt has chronic kidney disease.

## 2016-07-28 NOTE — Progress Notes (Signed)
Pharmacy Antibiotic Note  Joyce [name] is a 71 y.o. female admitted on 07/28/2016 with UTI.  Pharmacy has been consulted for ceftriaxone dosing.  Plan: Ceftriaxone 1 g IV daily  Height: 5\' 3"  (160 cm) Weight: 176 lb (79.8 kg) IBW/kg (Calculated) : 52.4  Temp (24hrs), Avg:98.5 F (36.9 C), Min:98.1 F (36.7 C), Max:98.9 F (37.2 C)   Recent Labs Lab 07/28/16 0956  WBC 17.1*  CREATININE 5.50*    Estimated Creatinine Clearance: 9.7 mL/min (by C-G formula based on SCr of 5.5 mg/dL (H)).    Allergies  Allergen Reactions  . Indomethacin Hives  . Pollen Extract Other (See Comments)    Sinus problems and HA   Antimicrobials this admission: ceftriaxone 1/8 >>   Dose adjustments this admission:  Microbiology results: 1/8 UCx: Sent   Thank you for allowing pharmacy to be a part of this patient's care.  Lenis Noon, PharmD, BCPS Clinical Pharmacist 07/28/2016 2:52 PM

## 2016-07-29 LAB — BASIC METABOLIC PANEL
ANION GAP: 13 (ref 5–15)
BUN: 86 mg/dL — ABNORMAL HIGH (ref 6–20)
CHLORIDE: 107 mmol/L (ref 101–111)
CO2: 16 mmol/L — ABNORMAL LOW (ref 22–32)
CREATININE: 4.82 mg/dL — AB (ref 0.44–1.00)
Calcium: 8.6 mg/dL — ABNORMAL LOW (ref 8.9–10.3)
GFR calc non Af Amer: 8 mL/min — ABNORMAL LOW (ref >60)
GFR, EST AFRICAN AMERICAN: 10 mL/min — AB (ref >60)
Glucose, Bld: 114 mg/dL — ABNORMAL HIGH (ref 65–99)
POTASSIUM: 3.9 mmol/L (ref 3.5–5.1)
SODIUM: 136 mmol/L (ref 135–145)

## 2016-07-29 LAB — CBC
HCT: 27.1 % — ABNORMAL LOW (ref 35.0–47.0)
Hemoglobin: 9.1 g/dL — ABNORMAL LOW (ref 12.0–16.0)
MCH: 30.6 pg (ref 26.0–34.0)
MCHC: 33.8 g/dL (ref 32.0–36.0)
MCV: 90.5 fL (ref 80.0–100.0)
PLATELETS: 338 10*3/uL (ref 150–440)
RBC: 2.99 MIL/uL — AB (ref 3.80–5.20)
RDW: 13.6 % (ref 11.5–14.5)
WBC: 9.8 10*3/uL (ref 3.6–11.0)

## 2016-07-29 MED ORDER — SENNA 8.6 MG PO TABS
1.0000 | ORAL_TABLET | Freq: Two times a day (BID) | ORAL | Status: DC
  Administered 2016-07-29 – 2016-07-31 (×3): 8.6 mg via ORAL
  Filled 2016-07-29 (×4): qty 1

## 2016-07-29 MED ORDER — CALCITRIOL 0.25 MCG PO CAPS
0.2500 ug | ORAL_CAPSULE | Freq: Every day | ORAL | Status: DC
  Administered 2016-07-29 – 2016-07-31 (×3): 0.25 ug via ORAL
  Filled 2016-07-29 (×3): qty 1

## 2016-07-29 NOTE — Progress Notes (Addendum)
[facility] [place] NAME: Joyce [name]    MR#:  [id number]  DATE OF BIRTH:  [birth year]  SUBJECTIVE: Admitted for hematuria, and has dysuria. And she has hematuria.   CHIEF COMPLAINT:   Chief Complaint  Patient presents with  . Hematuria    REVIEW OF SYSTEMS:   ROS CONSTITUTIONAL: No fever, fatigue or weakness.  EYES: No blurred or double vision.  EARS, NOSE, AND THROAT: No tinnitus or ear pain.  RESPIRATORY: No cough, shortness of breath, wheezing or hemoptysis.  CARDIOVASCULAR: No chest pain, orthopnea, edema.  GASTROINTESTINAL: No nausea, vomiting, diarrhea or abdominal pain.  GENITOURINARY: No dysuria, hematuria.  ENDOCRINE: No polyuria, nocturia,  HEMATOLOGY: No anemia, easy bruising or bleeding SKIN: No rash or lesion. MUSCULOSKELETAL: No joint pain or arthritis.   NEUROLOGIC: No tingling, numbness, weakness.  PSYCHIATRY: No anxiety or depression.   DRUG ALLERGIES:   Allergies  Allergen Reactions  . Indomethacin Hives  . Pollen Extract Other (See Comments)    Sinus problems and HA    VITALS:  Blood pressure (!) 108/49, pulse 70, temperature 98.6 F (37 C), temperature source Oral, resp. rate 18, height 5\' 3"  (1.6 m), weight 79.8 kg (176 lb), SpO2 99 %.  PHYSICAL EXAMINATION:  GENERAL:  71 y.o.-year-old patient lying in the bed with no acute distress.  EYES: Pupils equal, round, reactive to light and accommodation. No scleral icterus. Extraocular muscles intact.  HEENT: Head atraumatic, normocephalic. Oropharynx and nasopharynx clear.  NECK:  Supple, no jugular venous distention. No thyroid enlargement, no tenderness.  LUNGS: Normal breath sounds bilaterally, no wheezing, rales,rhonchi or crepitation. No use of accessory muscles of respiration.  CARDIOVASCULAR: S1, S2 normal. No murmurs, rubs, or gallops.  ABDOMEN: Soft, nontender, nondistended. Bowel sounds present. No organomegaly or mass.  EXTREMITIES: No  pedal edema, cyanosis, or clubbing.  NEUROLOGIC: Cranial nerves II through XII are intact. Muscle strength 5/5 in all extremities. Sensation intact. Gait not checked.  PSYCHIATRIC: The patient is alert and oriented x 3.  SKIN: No obvious rash, lesion, or ulcer.    LABORATORY PANEL:   CBC  Recent Labs Lab 07/29/16 0515  WBC 9.8  HGB 9.1*  HCT 27.1*  PLT 338   ------------------------------------------------------------------------------------------------------------------  Chemistries   Recent Labs Lab 07/28/16 0956 07/29/16 0515  NA 135 136  K 4.0 3.9  CL 105 107  CO2 18* 16*  GLUCOSE 113* 114*  BUN 91* 86*  CREATININE 5.50* 4.82*  CALCIUM 9.1 8.6*  AST 22  --   ALT 17  --   ALKPHOS 82  --   BILITOT 0.9  --    ------------------------------------------------------------------------------------------------------------------  Cardiac Enzymes No results for input(s): TROPONINI in the last 168 hours. ------------------------------------------------------------------------------------------------------------------  RADIOLOGY:  Ct Renal Stone Study  Result Date: 07/28/2016 CLINICAL DATA:  Hematuria beginning 3 days ago. EXAM: CT ABDOMEN AND PELVIS WITHOUT CONTRAST TECHNIQUE: Multidetector CT imaging of the abdomen and pelvis was performed following the standard protocol without IV contrast. COMPARISON:  05/10/2014 FINDINGS: Lower chest: Negative except for a small amount of pericardial fluid. Hepatobiliary: Liver is normal without contrast. Previous cholecystectomy. Pancreas: Normal Spleen: Normal Adrenals/Urinary Tract: Adrenal glands are normal. The patient has polycystic kidney disease with innumerable cysts bilaterally of varying density. Some are associated with punctate calcifications. There is no evidence of hydronephrosis or passing stone. No stone in the bladder. Stomach/Bowel: No acute bowel finding.  Left colon diverticulosis. Vascular/Lymphatic: Aortic  atherosclerosis.  No aneurysm. Reproductive: Previous  hysterectomy. 2.5 cm cyst associated with the left ovary. Other: No free fluid or air. Musculoskeletal: Negative IMPRESSION: Polycystic kidney disease with enlarged kidneys bilaterally containing innumerable cysts. Most of the cysts are low density, but some are associated with hemorrhage. There is no sign of hydronephrosis or passing stone. No evidence of renal inflammation. Aortic atherosclerosis. Left colon diverticulosis without evidence of diverticulitis. Electronically Signed   By: Nelson Chimes M.D.   On: 07/28/2016 14:37    EKG:   Orders placed or performed during the hospital encounter of 01/05/16  . ED EKG  . ED EKG  . EKG    ASSESSMENT AND PLAN:   #1 acute gross  hematuria likely secondary to cyst rupture in patient with multiple renal cysts with hemorrhage. Hemoglobin is stable but clear consult the placed for urology. Patient also has UTI with fever, dysuria. Continue Rocephin, follow urine cultures.  #2 chronic kidney disease stage V, gross hematuria, proteinuria: Patient to likely could have experienced cyst rupture with history of fall. Patient has polycystic kidney disease. . Nephrology discussed with her the possible need for dialysis in near future #3 essential hypertension:  Hypotensive yesterday,now better.Blood pressure borderline. #4/renal failure on chronic kidney disease stage 5; Improved with IV hydration, creatinine down from 5.5-4.82     All the records are reviewed and case discussed with Care Management/Social Workerr. Management plans discussed with the patient, family and they are in agreement.  CODE STATUS: Full code  TOTAL TIME TAKING CARE OF THIS PATIENT: 35 min minutes.   POSSIBLE D/C IN 1-2DAYS, DEPENDING ON CLINICAL CONDITION.   Epifanio Lesches M.D on 07/29/2016 at 4:14 PM  Between 7am to 6pm - Pager - [phone]  After 6pm go to [email or web address] - password [email or web address] [email or web address]  [facility]  Office  [phone]  CC: Primary care physician; Cletis Athens, MD   Note: This dictation was prepared with Dragon dictation along with smaller phrase technology. Any transcriptional errors that result from this process are unintentional.

## 2016-07-29 NOTE — Progress Notes (Signed)
[facility]  ROUNDING NOTE   Subjective:  Patient well known to Joyce [name] as an outpatient. She has underlying polycystic kidney disease. She states that she fell on Saturday and thereafter began having hematuria. CT scan of the abdomen and pelvis was performed which confirmed multiple renal cysts some of which contain hemorrhage. Renal function also appears to be worse and she has been approaching end-stage renal disease as an outpatient. She has an access in her left upper extremity.   Objective:  Vital signs in last 24 hours:  Temp:  [97.6 F (36.4 C)-101.1 F (38.4 C)] 98.6 F (37 C) (01/09 0855) Pulse Rate:  [64-93] 70 (01/09 0855) Resp:  [16-18] 18 (01/09 0438) BP: (80-135)/(38-66) 108/49 (01/09 0855) SpO2:  [97 %-100 %] 99 % (01/09 0855)  Weight change:  Filed Weights   07/28/16 1001  Weight: 79.8 kg (176 lb)    Intake/Output: I/O last 3 completed shifts: In: 47 [P.O.:580; I.V.:1100] Out: 550 [Urine:550]   Intake/Output this shift:  Total I/O In: 480 [P.O.:480] Out: -   Physical Exam: General: No acute distress  Head: Normocephalic, atraumatic. Moist oral mucosal membranes  Eyes: Anicteric  Neck: Supple, trachea midline  Lungs:  Clear to auscultation, normal effort  Heart: S1S2 no rubs  Abdomen:  Soft, nontender, bowel sounds present  Extremities: trace peripheral edema.  Neurologic: Nonfocal, moving all four extremities  Skin: No lesions  Access: LUE AVF    Basic Metabolic Panel:  Recent Labs Lab 07/28/16 0956 07/29/16 0515  NA 135 136  K 4.0 3.9  CL 105 107  CO2 18* 16*  GLUCOSE 113* 114*  BUN 91* 86*  CREATININE 5.50* 4.82*  CALCIUM 9.1 8.6*    Liver Function Tests:  Recent Labs Lab 07/28/16 0956  AST 22  ALT 17  ALKPHOS 82  BILITOT 0.9  PROT 7.7  ALBUMIN 3.5   No results for input(s): LIPASE, AMYLASE in the last 168 hours. No results for input(s): AMMONIA in the last 168 hours.  CBC:  Recent Labs Lab  07/28/16 0956 07/28/16 2019 07/29/16 0515  WBC 17.1*  --  9.8  HGB 10.2* 9.0* 9.1*  HCT 30.2*  --  27.1*  MCV 89.8  --  90.5  PLT 412  --  338    Cardiac Enzymes: No results for input(s): CKTOTAL, CKMB, CKMBINDEX, TROPONINI in the last 168 hours.  BNP: Invalid input(s): POCBNP  CBG:  Recent Labs Lab 07/28/16 [place]*    Microbiology: Results for orders placed or performed in visit on 05/10/14  Clostridium Difficile [facility])     Status: None   Collection Time: 05/11/14  9:33 AM  Result Value Ref Range Status   Micro Text Report   Final       C.DIFFICILE ANTIGEN       C.DIFFICILE GDH ANTIGEN : NEGATIVE   C.DIFFICILE TOXIN A/B     C.DIFFICILE TOXINS A AND B : NEGATIVE   INTERPRETATION            Negative for C. difficile.    ANTIBIOTIC                                                      Stool culture     Status: None   Collection Time: 05/11/14  9:33 AM  Result Value Ref  Range Status   Micro Text Report   Final       COMMENT                   NO SALMONELLA OR SHIGELLA ISOLATED   COMMENT                   NO PATHOGENIC E.COLI DETECTED   COMMENT                   NO CAMPYLOBACTER ANTIGEN DETECTED   ANTIBIOTIC                                                      Urine culture     Status: None   Collection Time: 05/11/14  1:44 PM  Result Value Ref Range Status   Micro Text Report   Final       SOURCE: CLEAN CATCH    ORGANISM 1                50,000 CFU/ML ESCHERICHIA COLI   COMMENT                   WITH MIXED-BACTERIAL-ORGANISMS   ANTIBIOTIC                    ORG#1     AMPICILLIN                    S         CEFAZOLIN                     S         CEFOXITIN                     S         CEFTRIAXONE                   S         CIPROFLOXACIN                 S         GENTAMICIN                    S         IMIPENEM                      S         LEVOFLOXACIN                  S         NITROFURANTOIN                S          TRIMETHOPRIM/SULFAMETHOXAZOLE S             Coagulation Studies: No results for input(s): LABPROT, INR in the last 72 hours.  Urinalysis:  Recent Labs  07/28/16 0956  COLORURINE RED*  LABSPEC 1.013  PHURINE TEST NOT REPORTED DUE TO COLOR INTERFERENCE OF URINE PIGMENT  GLUCOSEU TEST NOT REPORTED DUE TO COLOR INTERFERENCE OF URINE PIGMENT*  HGBUR TEST NOT REPORTED DUE TO COLOR INTERFERENCE OF URINE PIGMENT*  BILIRUBINUR TEST NOT REPORTED DUE TO COLOR  INTERFERENCE OF URINE PIGMENT*  KETONESUR TEST NOT REPORTED DUE TO COLOR INTERFERENCE OF URINE PIGMENT*  PROTEINUR TEST NOT REPORTED DUE TO COLOR INTERFERENCE OF URINE PIGMENT*  NITRITE TEST NOT REPORTED DUE TO COLOR INTERFERENCE OF URINE PIGMENT*  LEUKOCYTESUR TEST NOT REPORTED DUE TO COLOR INTERFERENCE OF URINE PIGMENT*      Imaging: Ct Renal Stone Study  Result Date: 07/28/2016 CLINICAL DATA:  Hematuria beginning 3 days ago. EXAM: CT ABDOMEN AND PELVIS WITHOUT CONTRAST TECHNIQUE: Multidetector CT imaging of the abdomen and pelvis was performed following the standard protocol without IV contrast. COMPARISON:  05/10/2014 FINDINGS: Lower chest: Negative except for a small amount of pericardial fluid. Hepatobiliary: Liver is normal without contrast. Previous cholecystectomy. Pancreas: Normal Spleen: Normal Adrenals/Urinary Tract: Adrenal glands are normal. The patient has polycystic kidney disease with innumerable cysts bilaterally of varying density. Some are associated with punctate calcifications. There is no evidence of hydronephrosis or passing stone. No stone in the bladder. Stomach/Bowel: No acute bowel finding.  Left colon diverticulosis. Vascular/Lymphatic: Aortic atherosclerosis.  No aneurysm. Reproductive: Previous hysterectomy. 2.5 cm cyst associated with the left ovary. Other: No free fluid or air. Musculoskeletal: Negative IMPRESSION: Polycystic kidney disease with enlarged kidneys bilaterally containing innumerable cysts. Most of  the cysts are low density, but some are associated with hemorrhage. There is no sign of hydronephrosis or passing stone. No evidence of renal inflammation. Aortic atherosclerosis. Left colon diverticulosis without evidence of diverticulitis. Electronically Signed   By: Nelson Chimes M.D.   On: 07/28/2016 14:37     Medications:   . sodium chloride 60 mL/hr at 07/29/16 0442   . carbamazepine  200 mg Oral BID  . cefTRIAXone  1 g Intravenous Q24H  . cholecalciferol  1,000 Units Oral Daily  . [START ON 08/25/2016] cyanocobalamin  1,000 mcg Intramuscular Q30 days  . gabapentin  100 mg Oral QHS  . latanoprost  1 drop Both Eyes QHS  . pantoprazole  40 mg Oral Daily  . QUEtiapine  50 mg Oral QHS  . sodium chloride flush  3 mL Intravenous Q12H  . timolol  1 drop Both Eyes BID  . verapamil  240 mg Oral QHS   acetaminophen **OR** acetaminophen, bisacodyl, ketorolac, ondansetron **OR** ondansetron (ZOFRAN) IV, senna-docusate, sodium phosphate, traMADol  Assessment/ Plan:  71 y.o. female 71 year old Caucasian female with past medical history of diverticulosis, hypertension, bipolar disorder, polycystic kidneys noted on CT scan who returns for followup of chronic kidney disease stage IV, polycystic kidney disease.  1. Chronic kidney disease stage 5/polycystic kidney disease/proteinuria/gross hematuria:  Patient likely experienced cyst rupture status post fall and now has hematuria. CT scan of the abdomen and pelvis confirmed multiple renal cysts, which have hemorrhage. We will obtain consult from urology to see if there is anything further that needs to be done. This should hopefully resolve on its own. In regards to her underlying renal insufficiency there is no urgent indication to start dialysis however I did discussed with the patient that she will likely need to start renal replacement therapy within the next several weeks. She verbalizes understanding of this. She has a left upper extremity AV fistula  in place that appears to be ready to use.   2. Hypertension. Blood pressure currently 108/49. Currently she is only on verapamil. Resume calcitriol 0.25 g by mouth daily.   3. Secondary hyperparathyroidism. Most recent PTH was 107, phosphorus 4.3, calcium 8.6.  4. Anemia chronic kidney disease.  Hemoglobin down slightly to 9.1. We will likely consider  starting Epogen with dialysis but we'll hold off right now.    LOS: 1 Champagne Paletta 1/9/201811:31 AM

## 2016-07-29 NOTE — Evaluation (Signed)
Physical Therapy Evaluation Patient Details Name: Tonya Wantz MRN: [id number] DOB: 11-11-1945 Today's Date: 07/29/2016   History of Present Illness  Pt admitted for acute kidney insufficiency. Pt with complaints of hematuria. Pt with history of polycystic kidney dx along with CKD stage 4, GERD, and bipolar.   Clinical Impression  Pt is a pleasant 71 year old female who was admitted for acute kidney insufficiency. Pt performs bed mobility with mod I, transfers with cga, and ambulation with cga and IV pole. Would benefit from trial ambulation with [facility] for balance. Pt reports this is her first time walking and is slightly unsteady, no formal LOB noted. Pt demonstrates deficits with endurance/mobility. Would benefit from skilled PT to address above deficits and promote optimal return to PLOF. Recommend transition to Hydro upon discharge from acute hospitalization.       Follow Up Recommendations Home health PT    Equipment Recommendations  Cane    Recommendations for Other Services       Precautions / Restrictions Precautions Precautions: Fall Restrictions Weight Bearing Restrictions: No      Mobility  Bed Mobility Overal bed mobility: Modified Independent             General bed mobility comments: safe technique performed with ability to sit at EOB with ease  Transfers Overall transfer level: Needs assistance Equipment used: 1 person hand held assist Transfers: Sit to/from Stand Sit to Stand: Min guard         General transfer comment: safe technique with correct form noted. Once standing, holds onto IV pole for balance.  Ambulation/Gait Ambulation/Gait assistance: Min guard Ambulation Distance (Feet): 200 Feet Assistive device:  (IV pole) Gait Pattern/deviations: Step-through pattern     General Gait Details: ambulated using reciprocal gait pattern with slow gait speed. Pt held onto IV pole for balance, however reports no dizziness noted. Would benefit from  trial use of SPC.   Stairs            Wheelchair Mobility    Modified Rankin (Stroke Patients Only)       Balance Overall balance assessment: Needs assistance Sitting-balance support: Feet supported Sitting balance-Leahy Scale: Good     Standing balance support: Bilateral upper extremity supported Standing balance-Leahy Scale: Good                               Pertinent Vitals/Pain Pain Assessment: 0-10 Pain Score: 6  Pain Location: headache Pain Descriptors / Indicators: Aching Pain Intervention(s): Limited activity within patient's tolerance (pain improved to 2/10 with ambulation)    Home Living Family/patient expects to be discharged to:: Private residence Living Arrangements: Alone   Type of Home: House Home Access: Ramped entrance     Home Layout: One level Home Equipment: None      Prior Function Level of Independence: Independent         Comments: was working at [employer]        Extremity/Trunk Assessment   Upper Extremity Assessment Upper Extremity Assessment: Overall WFL for tasks assessed    Lower Extremity Assessment Lower Extremity Assessment: Generalized weakness (grossly 4+/5)       Communication   Communication: No difficulties  Cognition Arousal/Alertness: Awake/alert Behavior During Therapy: WFL for tasks assessed/performed Overall Cognitive Status: Within Functional Limits for tasks assessed  General Comments      Exercises Other Exercises Other Exercises: Pt ambulated using IV pole to bathroom with cga. Cues given for bathroom tasks with supervision noted.    Assessment/Plan    PT Assessment Patient needs continued PT services  PT Problem List Decreased strength;Decreased activity tolerance;Decreased balance;Decreased mobility;Pain          PT Treatment Interventions Gait training;DME instruction;Therapeutic exercise    PT Goals (Current goals  can be found in the Care Plan section)  Acute Rehab PT Goals Patient Stated Goal: to get back home PT Goal Formulation: With patient Time For Goal Achievement: 08/12/16 Potential to Achieve Goals: Good    Frequency Min 2X/week   Barriers to discharge        Co-evaluation               End of Session Equipment Utilized During Treatment: Gait belt Activity Tolerance: Patient tolerated treatment well Patient left: in chair;with chair alarm set Nurse Communication: Mobility status         Time: 6015-6153 PT Time Calculation (min) (ACUTE ONLY): 36 min   Charges:   PT Evaluation $PT Eval Moderate Complexity: 1 Procedure PT Treatments $Therapeutic Activity: 8-22 mins   PT G Codes:        Juanjose Mojica 2016-07-30, 2:53 PM  Greggory Stallion, PT, DPT [phone]

## 2016-07-30 DIAGNOSIS — R109 Unspecified abdominal pain: Secondary | ICD-10-CM

## 2016-07-30 DIAGNOSIS — R319 Hematuria, unspecified: Secondary | ICD-10-CM

## 2016-07-30 DIAGNOSIS — R262 Difficulty in walking, not elsewhere classified: Secondary | ICD-10-CM

## 2016-07-30 LAB — BASIC METABOLIC PANEL
Anion gap: 11 (ref 5–15)
BUN: 72 mg/dL — AB (ref 6–20)
CHLORIDE: 112 mmol/L — AB (ref 101–111)
CO2: 16 mmol/L — ABNORMAL LOW (ref 22–32)
CREATININE: 4.13 mg/dL — AB (ref 0.44–1.00)
Calcium: 8.9 mg/dL (ref 8.9–10.3)
GFR calc Af Amer: 12 mL/min — ABNORMAL LOW (ref >60)
GFR calc non Af Amer: 10 mL/min — ABNORMAL LOW (ref >60)
GLUCOSE: 117 mg/dL — AB (ref 65–99)
Potassium: 4 mmol/L (ref 3.5–5.1)
SODIUM: 139 mmol/L (ref 135–145)

## 2016-07-30 LAB — CBC
HCT: 29.8 % — ABNORMAL LOW (ref 35.0–47.0)
HEMOGLOBIN: 10 g/dL — AB (ref 12.0–16.0)
MCH: 30.3 pg (ref 26.0–34.0)
MCHC: 33.5 g/dL (ref 32.0–36.0)
MCV: 90.4 fL (ref 80.0–100.0)
Platelets: 363 10*3/uL (ref 150–440)
RBC: 3.3 MIL/uL — ABNORMAL LOW (ref 3.80–5.20)
RDW: 13.7 % (ref 11.5–14.5)
WBC: 10.2 10*3/uL (ref 3.6–11.0)

## 2016-07-30 MED ORDER — SODIUM BICARBONATE 8.4 % IV SOLN
INTRAVENOUS | Status: DC
  Administered 2016-07-30: 13:00:00 via INTRAVENOUS
  Filled 2016-07-30 (×2): qty 150

## 2016-07-30 NOTE — Progress Notes (Signed)
[facility]  ROUNDING NOTE   Subjective:  Patient still having some mild hematuria. Urine culture showing gram-negative rods. Next line patient is currently on ceftriaxone. Renal function has improved a bit. Creatinine down to 4.13 with an EGFR of 12.   Objective:  Vital signs in last 24 hours:  Temp:  [98.5 F (36.9 C)-99 F (37.2 C)] 98.5 F (36.9 C) (01/10 0917) Pulse Rate:  [79-100] 84 (01/10 0917) Resp:  [20] 20 (01/10 0442) BP: (121-160)/(43-63) 137/50 (01/10 0917) SpO2:  [96 %-98 %] 98 % (01/10 0917)  Weight change:  Filed Weights   07/28/16 1001  Weight: 79.8 kg (176 lb)    Intake/Output: I/O last 3 completed shifts: In: 30 [P.O.:1900; I.V.:2300] Out: 1950 [Urine:1950]   Intake/Output this shift:  Total I/O In: 497 [P.O.:240; I.V.:257] Out: 400 [Urine:400]  Physical Exam: General: No acute distress  Head: Normocephalic, atraumatic. Moist oral mucosal membranes  Eyes: Anicteric  Neck: Supple, trachea midline  Lungs:  Clear to auscultation, normal effort  Heart: S1S2 no rubs  Abdomen:  Soft, nontender, bowel sounds present  Extremities: trace peripheral edema.  Neurologic: Nonfocal, moving all four extremities  Skin: No lesions  Access: LUE AVF    Basic Metabolic Panel:  Recent Labs Lab 07/28/16 0956 07/29/16 0515 07/30/16 1020  NA 135 136 139  K 4.0 3.9 4.0  CL 105 107 112*  CO2 18* 16* 16*  GLUCOSE 113* 114* 117*  BUN 91* 86* 72*  CREATININE 5.50* 4.82* 4.13*  CALCIUM 9.1 8.6* 8.9    Liver Function Tests:  Recent Labs Lab 07/28/16 0956  AST 22  ALT 17  ALKPHOS 82  BILITOT 0.9  PROT 7.7  ALBUMIN 3.5   No results for input(s): LIPASE, AMYLASE in the last 168 hours. No results for input(s): AMMONIA in the last 168 hours.  CBC:  Recent Labs Lab 07/28/16 0956 07/28/16 2019 07/29/16 0515 07/30/16 1020  WBC 17.1*  --  9.8 10.2  HGB 10.2* 9.0* 9.1* 10.0*  HCT 30.2*  --  27.1* 29.8*  MCV 89.8  --  90.5 90.4   PLT 412  --  338 363    Cardiac Enzymes: No results for input(s): CKTOTAL, CKMB, CKMBINDEX, TROPONINI in the last 168 hours.  BNP: Invalid input(s): POCBNP  CBG:  Recent Labs Lab 07/28/16 2235  GLUCAP 117*    Microbiology: Results for orders placed or performed during the hospital encounter of 07/28/16  Urine culture     Status: Abnormal (Preliminary result)   Collection Time: 07/28/16  9:56 AM  Result Value Ref Range Status   Specimen Description URINE, RANDOM  Final   Special Requests NONE  Final   Culture (A)  Final    >=100,000 COLONIES/mL GRAM NEGATIVE RODS IDENTIFICATION AND SUSCEPTIBILITIES TO FOLLOW Performed at [facility]    Report Status PENDING  Incomplete    Coagulation Studies: No results for input(s): LABPROT, INR in the last 72 hours.  Urinalysis:  Recent Labs  07/28/16 0956  COLORURINE RED*  LABSPEC 1.013  PHURINE TEST NOT REPORTED DUE TO COLOR INTERFERENCE OF URINE PIGMENT  GLUCOSEU TEST NOT REPORTED DUE TO COLOR INTERFERENCE OF URINE PIGMENT*  HGBUR TEST NOT REPORTED DUE TO COLOR INTERFERENCE OF URINE PIGMENT*  BILIRUBINUR TEST NOT REPORTED DUE TO COLOR INTERFERENCE OF URINE PIGMENT*  KETONESUR TEST NOT REPORTED DUE TO COLOR INTERFERENCE OF URINE PIGMENT*  PROTEINUR TEST NOT REPORTED DUE TO COLOR INTERFERENCE OF URINE PIGMENT*  NITRITE TEST NOT REPORTED DUE TO  COLOR INTERFERENCE OF URINE PIGMENT*  LEUKOCYTESUR TEST NOT REPORTED DUE TO COLOR INTERFERENCE OF URINE PIGMENT*      Imaging: Ct Renal Stone Study  Result Date: 07/28/2016 CLINICAL DATA:  Hematuria beginning 3 days ago. EXAM: CT ABDOMEN AND PELVIS WITHOUT CONTRAST TECHNIQUE: Multidetector CT imaging of the abdomen and pelvis was performed following the standard protocol without IV contrast. COMPARISON:  05/10/2014 FINDINGS: Lower chest: Negative except for a small amount of pericardial fluid. Hepatobiliary: Liver is normal without contrast. Previous cholecystectomy. Pancreas:  Normal Spleen: Normal Adrenals/Urinary Tract: Adrenal glands are normal. The patient has polycystic kidney disease with innumerable cysts bilaterally of varying density. Some are associated with punctate calcifications. There is no evidence of hydronephrosis or passing stone. No stone in the bladder. Stomach/Bowel: No acute bowel finding.  Left colon diverticulosis. Vascular/Lymphatic: Aortic atherosclerosis.  No aneurysm. Reproductive: Previous hysterectomy. 2.5 cm cyst associated with the left ovary. Other: No free fluid or air. Musculoskeletal: Negative IMPRESSION: Polycystic kidney disease with enlarged kidneys bilaterally containing innumerable cysts. Most of the cysts are low density, but some are associated with hemorrhage. There is no sign of hydronephrosis or passing stone. No evidence of renal inflammation. Aortic atherosclerosis. Left colon diverticulosis without evidence of diverticulitis. Electronically Signed   By: Nelson Chimes M.D.   On: 07/28/2016 14:37     Medications:   . sodium chloride 60 mL/hr at 07/30/16 0939   . calcitRIOL  0.25 mcg Oral Daily  . carbamazepine  200 mg Oral BID  . cefTRIAXone  1 g Intravenous Q24H  . cholecalciferol  1,000 Units Oral Daily  . [START ON 08/25/2016] cyanocobalamin  1,000 mcg Intramuscular Q30 days  . gabapentin  100 mg Oral QHS  . latanoprost  1 drop Both Eyes QHS  . pantoprazole  40 mg Oral Daily  . QUEtiapine  50 mg Oral QHS  . senna  1 tablet Oral BID  . sodium chloride flush  3 mL Intravenous Q12H  . timolol  1 drop Both Eyes BID  . verapamil  240 mg Oral QHS   acetaminophen **OR** acetaminophen, bisacodyl, ketorolac, ondansetron **OR** ondansetron (ZOFRAN) IV, senna-docusate, sodium phosphate, traMADol  Assessment/ Plan:  71 y.o. female 71 year old Caucasian female with past medical history of diverticulosis, hypertension, bipolar disorder, polycystic kidneys noted on CT scan who returns for followup of chronic kidney disease stage IV,  polycystic kidney disease.  1. Chronic kidney disease stage 5/polycystic kidney disease/proteinuria/gross hematuria/UTI:  Patient likely experienced cyst rupture status post fall and now has hematuria. CT scan of the abdomen and pelvis confirmed multiple renal cysts, which have hemorrhage.  -  Patient states that she still having some mild hematuria. We are awaiting further input from urology. Gram-negative rods are also growing in the urine and therefore we will continue the patient on ceftriaxone. Often times it is difficult to distinction between cyst infection versus cyst hemorrhage. Nonetheless given an elevated temperature yesterday we will continue with ceftriaxone. No urgent indication to initiate dialysis at the moment however we are having ongoing discussions about the timing of renal replacement therapy.   2. Hypertension. Continue the patient on verapamil for now.   3. Secondary hyperparathyroidism. Most recent PTH was 107, phosphorus 4.3, calcium 8.6.  Continue calcitriol.   4. Anemia chronic kidney disease.  Hemoglobin is actually up today to 10.0. Therefore it appears that she has not had massive hemorrhage. Continue to monitor CBC periodically.  5. Metabolic acidosis. Serum bicarbonate down to 16. We will discontinue 0.9 normal  saline in favor of sodium bicarbonate.    LOS: 2 Joyce [name] 1/10/201811:35 AM

## 2016-07-30 NOTE — Consult Note (Signed)
Urology Consult  I have been asked to see the patient by Dr. Vianne Bulls, for evaluation and management of gross hematuria.  Chief Complaint: dysuria, gross hematuria  History of Present Illness: Joyce [name] is a 71 y.o. year old kidney disease, chronic kidney disease stage IV admitted 2 days ago with gross hematuria. She notes that a few days prior to her admission (Friday), she developed gross hematuria and dysuria.  The following day, she fell and thought that she had ruptured one of her renal cysts. The bleeding failed to resolve, she ultimately presented to the emergency room and was admitted.  She denies any significant flank pain. She did have temp to 101.1 early during this admission.  As part of her workup, she underwent a noncontrast CT abdomen and pelvis which shows numerous large bilateral cyst with known polycystic kidney disease, some with hemorrhagic component but no significant perinephric fluid or hematoma. There is no evidence of clot in the collecting system although the study was noncontrast.  Her hemoglobin has remained relatively stable since admission.  Urinalysis was difficult to interpret given the amount of blood, however, her cultures growing gram-negative rods. She is currently on ceftriaxone and her symptoms are improving.  She denies a personal history of urinary tract infections. She is denies a history of gross hematuria. She is able to empty her bladder now and her hematuria is resolving. No clots.  Past Medical History:  Diagnosis Date  . Anal fissure   . Bipolar affective disorder ([place])   . CKD (chronic kidney disease)    Dr Holley Raring [name] 4  . Colon polyps   . Diverticulitis   . Diverticulitis   . Family history of adverse reaction to anesthesia    mom - PONV  . GERD (gastroesophageal reflux disease)   . Headache    migraines - none over 10 yrs  . Heart murmur   . History of hiatal hernia   . Hypertension   . Pancreatitis    Valproic acid   . Vertigo     Past Surgical History:  Procedure Laterality Date  . ABDOMINAL HYSTERECTOMY  1990 ?  . AV FISTULA PLACEMENT  4/30  . BREAST EXCISIONAL BIOPSY Left 1994   neg surgical bx  . CHOLECYSTECTOMY  2003  . COLONOSCOPY  2014   Dr. Jamal Collin  . COLONOSCOPY WITH PROPOFOL N/A 09/24/2015   Procedure: COLONOSCOPY WITH random colon byopies.;  Surgeon: Lucilla Lame, MD;  Location: [facility];  Service: Endoscopy;  Laterality: N/A;  . ESOPHAGOGASTRODUODENOSCOPY (EGD) WITH PROPOFOL N/A 09/24/2015   Procedure: ESOPHAGOGASTRODUODENOSCOPY (EGD) ;  Surgeon: Lucilla Lame, MD;  Location: [facility];  Service: Endoscopy;  Laterality: N/A;  . EYE SURGERY    . PERIPHERAL VASCULAR CATHETERIZATION N/A 05/29/2015   Procedure: A/V Shuntogram/Fistulagram;  Surgeon: Katha Cabal, MD;  Location: [place] [facility];  Service: Cardiovascular;  Laterality: N/A;  . PERIPHERAL VASCULAR CATHETERIZATION N/A 05/29/2015   Procedure: A/V Shunt Intervention;  Surgeon: Katha Cabal, MD;  Location: [place] [facility];  Service: Cardiovascular;  Laterality: N/A;  . POLYPECTOMY  09/24/2015   Procedure: POLYPECTOMY INTESTINAL;  Surgeon: Lucilla Lame, MD;  Location: [place];  Service: Endoscopy;;  cecal polyp ascending polyp    Home Medications:  Current Meds  Medication Sig  . acetaminophen (TYLENOL) 325 MG tablet Take 650 mg by mouth every 4 (four) hours as needed for mild pain.  . bimatoprost (LUMIGAN) 0.01 % SOLN Place 1 drop  into both eyes at bedtime.  . carbamazepine (TEGRETOL XR) 200 MG 12 hr tablet Take 200 mg by mouth 2 (two) times daily.  . cyanocobalamin (,VITAMIN B-12,) 1000 MCG/ML injection Inject 1,000 mcg into the muscle every 30 (thirty) days.  Marland Kitchen gabapentin (NEURONTIN) 100 MG capsule Take 100 mg by mouth at bedtime.  Marland Kitchen lisinopril (PRINIVIL,ZESTRIL) 20 MG tablet Take 20 mg by mouth 2 (two) times daily.   . pantoprazole (PROTONIX) 40 MG tablet Take 1 tablet (40 mg total)  by mouth daily.  . QUEtiapine (SEROQUEL) 25 MG tablet Take 50 mg by mouth at bedtime. She takes two 25mg  tabs at bedtime   . timolol (TIMOPTIC) 0.5 % ophthalmic solution Place 1 drop into both eyes 2 (two) times daily.  . verapamil (CALAN-SR) 240 MG CR tablet Take 240 mg by mouth at bedtime.     Allergies:  Allergies  Allergen Reactions  . Indomethacin Hives  . Pollen Extract Other (See Comments)    Sinus problems and HA    Family History  Problem Relation Age of Onset  . Stroke Father   . Hypertension Father   . Colon cancer Neg Hx   . Liver disease Neg Hx     Social History:  reports that she has never smoked. She has never used smokeless tobacco. She reports that she does not drink alcohol or use drugs.  ROS: A complete review of systems was performed.  All systems are negative except for pertinent findings as noted.  Physical Exam:  Vital signs in last 24 hours: Temp:  [98.5 F (36.9 C)-99 F (37.2 C)] 98.5 F (36.9 C) (01/10 0917) Pulse Rate:  [79-100] 84 (01/10 0917) Resp:  [20] 20 (01/10 0442) BP: (121-160)/(43-63) 137/50 (01/10 0917) SpO2:  [96 %-98 %] 98 % (01/10 0917) Constitutional:  Alert and oriented, No acute distress HEENT: [place] AT, moist mucus membranes.  Trachea midline, no masses Cardiovascular: Regular rate and rhythm, no clubbing, cyanosis, or edema. Respiratory: Normal respiratory effort, lungs clear bilaterally GI: Abdomen is soft, nondistended, no abdominal masses.  Mild suprapubic tenderness. GU: No CVA tenderness.    Skin: No rashes, bruises or suspicious lesions Neurologic: Grossly intact, no focal deficits, moving all 4 extremities Psychiatric: Normal mood and affect   Laboratory Data:   Recent Labs  07/28/16 0956 07/28/16 2019 07/29/16 0515 07/30/16 1020  WBC 17.1*  --  9.8 10.2  HGB 10.2* 9.0* 9.1* 10.0*  HCT 30.2*  --  27.1* 29.8*    Recent Labs  07/28/16 0956 07/29/16 0515 07/30/16 1020  NA 135 136 139  K 4.0 3.9 4.0  CL  105 107 112*  CO2 18* 16* 16*  GLUCOSE 113* 114* 117*  BUN 91* 86* 72*  CREATININE 5.50* 4.82* 4.13*  CALCIUM 9.1 8.6* 8.9   No results for input(s): LABPT, INR in the last 72 hours. No results for input(s): LABURIN in the last 72 hours. Results for orders placed or performed during the hospital encounter of 07/28/16  Urine culture     Status: Abnormal (Preliminary result)   Collection Time: 07/28/16  9:56 AM  Result Value Ref Range Status   Specimen Description URINE, RANDOM  Final   Special Requests NONE  Final   Culture (A)  Final    >=100,000 COLONIES/mL ESCHERICHIA COLI SUSCEPTIBILITIES TO FOLLOW Performed at [facility]    Report Status PENDING  Incomplete     Radiologic Imaging: Ct Renal Stone Study  Result Date: 07/28/2016 CLINICAL DATA:  Hematuria  beginning 3 days ago. EXAM: CT ABDOMEN AND PELVIS WITHOUT CONTRAST TECHNIQUE: Multidetector CT imaging of the abdomen and pelvis was performed following the standard protocol without IV contrast. COMPARISON:  05/10/2014 FINDINGS: Lower chest: Negative except for a small amount of pericardial fluid. Hepatobiliary: Liver is normal without contrast. Previous cholecystectomy. Pancreas: Normal Spleen: Normal Adrenals/Urinary Tract: Adrenal glands are normal. The patient has polycystic kidney disease with innumerable cysts bilaterally of varying density. Some are associated with punctate calcifications. There is no evidence of hydronephrosis or passing stone. No stone in the bladder. Stomach/Bowel: No acute bowel finding.  Left colon diverticulosis. Vascular/Lymphatic: Aortic atherosclerosis.  No aneurysm. Reproductive: Previous hysterectomy. 2.5 cm cyst associated with the left ovary. Other: No free fluid or air. Musculoskeletal: Negative IMPRESSION: Polycystic kidney disease with enlarged kidneys bilaterally containing innumerable cysts. Most of the cysts are low density, but some are associated with hemorrhage. There is no sign of  hydronephrosis or passing stone. No evidence of renal inflammation. Aortic atherosclerosis. Left colon diverticulosis without evidence of diverticulitis. Electronically Signed   By: Nelson Chimes M.D.   On: 07/28/2016 14:37   CT scan personally reviewed today.  Assessment/Plan: 71 year old female with history of polycystic kidney disease with gross hematuria likely secondary to UTI.  1. Gross hematuria- given the lack of perinephric hematoma/ fluid or any evidence of cyst rupture, I doubt that this is the source of her bleeding.  Additionally, her hemoglobin has remained stable. She does have an active urinary tract infection, urine growing Escherichia coli which is likely the source. I would recommend outpatient follow-up with urology in approximately 1 month to reassess for any ongoing microscopic or gross hematuria which is ongoing. At this point, in the setting of infection, hematuria workup is not indicated.  2. Febrile cystitis/pyelonephritis with hematuria- leukocytosis, fever, and urine culture growing Escherichia coli. Seems to be responding to ceftriaxone, just based on culture and sensitivity data. If she does not improve or develops recurrent fevers, would consider fluoroquinolone as this is favorable to treating patients with renal cyst as this allows for penetration into the cyst itself.  3. Acute on chronic kidney disease, improved with hydration. Followed by nephrology.  4. Polycystic kidney disease- followed by nephrology.  Urology will sign off, please contact us if patient's clinical status changes.  07/30/2016, 12:07 PM  Hollice Espy,  MD

## 2016-07-30 NOTE — Progress Notes (Signed)
[facility] NAME: Joyce [name]    MR#:  [id number]  DATE OF BIRTH:  June 25, 1946  SUBJECTIVE: She still has some hematuria but better than yesterday. Had some temperature yesterday hemoglobin up to 10, improved kidney function..   CHIEF COMPLAINT:   Chief Complaint  Patient presents with  . Hematuria    REVIEW OF SYSTEMS:   ROS CONSTITUTIONAL: No fever, fatigue or weakness.  EYES: No blurred or double vision.  EARS, NOSE, AND THROAT: No tinnitus or ear pain.  RESPIRATORY: No cough, shortness of breath, wheezing or hemoptysis.  CARDIOVASCULAR: No chest pain, orthopnea, edema.  GASTROINTESTINAL: No nausea, vomiting, diarrhea or abdominal pain.  GENITOURINARY: No dysuria, hematuria.  ENDOCRINE: No polyuria, nocturia,  HEMATOLOGY: No anemia, easy bruising or bleeding SKIN: No rash or lesion. MUSCULOSKELETAL: No joint pain or arthritis.   NEUROLOGIC: No tingling, numbness, weakness.  PSYCHIATRY: No anxiety or depression.   DRUG ALLERGIES:   Allergies  Allergen Reactions  . Indomethacin Hives  . Pollen Extract Other (See Comments)    Sinus problems and HA    VITALS:  Blood pressure (!) 137/50, pulse 84, temperature 98.5 F (36.9 C), temperature source Oral, resp. rate 20, height 5\' 3"  (1.6 m), weight 79.8 kg (176 lb), SpO2 98 %.  PHYSICAL EXAMINATION:  GENERAL:  71 y.o.-year-old patient lying in the bed with no acute distress.  EYES: Pupils equal, round, reactive to light and accommodation. No scleral icterus. Extraocular muscles intact.  HEENT: Head atraumatic, normocephalic. Oropharynx and nasopharynx clear.  NECK:  Supple, no jugular venous distention. No thyroid enlargement, no tenderness.  LUNGS: Normal breath sounds bilaterally, no wheezing, rales,rhonchi or crepitation. No use of accessory muscles of respiration.  CARDIOVASCULAR: S1, S2 normal. No murmurs, rubs, or gallops.  ABDOMEN: Soft, nontender,  nondistended. Bowel sounds present. No organomegaly or mass.  EXTREMITIES: No pedal edema, cyanosis, or clubbing.  NEUROLOGIC: Cranial nerves II through XII are intact. Muscle strength 5/5 in all extremities. Sensation intact. Gait not checked.  PSYCHIATRIC: The patient is alert and oriented x 3.  SKIN: No obvious rash, lesion, or ulcer.    LABORATORY PANEL:   CBC  Recent Labs Lab 07/30/16 1020  WBC 10.2  HGB 10.0*  HCT 29.8*  PLT 363   ------------------------------------------------------------------------------------------------------------------  Chemistries   Recent Labs Lab 07/28/16 0956  07/30/16 1020  NA 135  < > 139  K 4.0  < > 4.0  CL 105  < > 112*  CO2 18*  < > 16*  GLUCOSE 113*  < > 117*  BUN 91*  < > 72*  CREATININE 5.50*  < > 4.13*  CALCIUM 9.1  < > 8.9  AST 22  --   --   ALT 17  --   --   ALKPHOS 82  --   --   BILITOT 0.9  --   --   < > = values in this interval not displayed. ------------------------------------------------------------------------------------------------------------------  Cardiac Enzymes No results for input(s): TROPONINI in the last 168 hours. ------------------------------------------------------------------------------------------------------------------  RADIOLOGY:  Ct Renal Stone Study  Result Date: 07/28/2016 CLINICAL DATA:  Hematuria beginning 3 days ago. EXAM: CT ABDOMEN AND PELVIS WITHOUT CONTRAST TECHNIQUE: Multidetector CT imaging of the abdomen and pelvis was performed following the standard protocol without IV contrast. COMPARISON:  05/10/2014 FINDINGS: Lower chest: Negative except for a small amount of pericardial fluid. Hepatobiliary: Liver is normal without contrast. Previous cholecystectomy. Pancreas: Normal Spleen: Normal Adrenals/Urinary Tract:  Adrenal glands are normal. The patient has polycystic kidney disease with innumerable cysts bilaterally of varying density. Some are associated with punctate calcifications.  There is no evidence of hydronephrosis or passing stone. No stone in the bladder. Stomach/Bowel: No acute bowel finding.  Left colon diverticulosis. Vascular/Lymphatic: Aortic atherosclerosis.  No aneurysm. Reproductive: Previous hysterectomy. 2.5 cm cyst associated with the left ovary. Other: No free fluid or air. Musculoskeletal: Negative IMPRESSION: Polycystic kidney disease with enlarged kidneys bilaterally containing innumerable cysts. Most of the cysts are low density, but some are associated with hemorrhage. There is no sign of hydronephrosis or passing stone. No evidence of renal inflammation. Aortic atherosclerosis. Left colon diverticulosis without evidence of diverticulitis. Electronically Signed   By: Nelson Chimes M.D.   On: 07/28/2016 14:37    EKG:   Orders placed or performed during the hospital encounter of 01/05/16  . ED EKG  . ED EKG  . EKG    ASSESSMENT AND PLAN:   #1 acute gross  hematuria likely secondary to cyst rupture in patient with multiple renal cysts with hemorrhage. Hemoglobin is stable but urology  consult the placed for urology. Patient also has UTI with fever, dysuria. Continue Rocephin, and cultures showed gram-negative rods   #2 chronic kidney disease stage V, gross hematuria, proteinuria: Patient to likely could have experienced cyst rupture with history of fall. Patient has polycystic kidney disease. . Nephrology discussed with her the possible need for dialysis in near future. Hemoglobin stable but urology consult is still ending. D/w  with the patient. Once urine cultures the results are finalized, we will discharge the patient hopefully tomorrow.   #3 essential hypertension:  Hypotensive yesterday,now better.Blood pressure borderline.  #4/renal failure on chronic kidney disease stage 5; Improved with IV hydration, creatinine down from 5.5-4.82-4,13     All the records are reviewed and case discussed with Care Management/Social Workerr. Management  plans discussed with the patient, family and they are in agreement.  CODE STATUS: Full code  TOTAL TIME TAKING CARE OF THIS PATIENT: 35 min minutes.   POSSIBLE D/C IN 1-2DAYS, DEPENDING ON CLINICAL CONDITION.   Epifanio Lesches M.D on 07/30/2016 at 12:03 PM  Between 7am to 6pm - Pager - [phone]  After 6pm go to [email or web address] - password [email or web address] [facility]  Office  [phone]  CC: Primary care physician; Cletis Athens, MD   Note: This dictation was prepared with Dragon dictation along with smaller phrase technology. Any transcriptional errors that result from this process are unintentional.

## 2016-07-31 LAB — URINE CULTURE

## 2016-07-31 MED ORDER — CEPHALEXIN 250 MG PO CAPS: 250.0000 mg | ORAL_CAPSULE | Freq: Two times a day (BID) | ORAL | 0 refills | Status: DC

## 2016-07-31 NOTE — Care Management (Addendum)
Patient admitted with hematuria secondary to UTI.  Patient lives at home alone.  Sister lives locally for support.  PCP [place].  PT has assessed patient recommended home PT and cane.  Home health agency preference was provided to patient.  Patient did not have preference and referral was made to [facility].  Joyce [name] was delivered to room, and patient declined.  RNCM signing off

## 2016-07-31 NOTE — Discharge Summary (Signed)
Joyce [name], is a 71 y.o. female  DOB [birth year]  MRN [id number].  Admission date:  07/28/2016  Admitting Physician  Bettey Costa, MD  Discharge Date:  07/31/2016   Primary MD  Cletis Athens, MD  Recommendations for primary care physician for things to follow:  Follow-up with primary doctor in 1 month.  follow up with DR.Hollice [facility] in one month Dr.Lateef in one week  Admission Diagnosis  Right flank pain [R10.9]   Discharge Diagnosis  Right flank pain [R10.9]    Active Problems:   Acute kidney insufficiency      Past Medical History:  Diagnosis Date  . Anal fissure   . Bipolar affective disorder ([place])   . CKD (chronic kidney disease)    Dr Holley Raring [name] 4  . Colon polyps   . Diverticulitis   . Diverticulitis   . Family history of adverse reaction to anesthesia    mom - PONV  . GERD (gastroesophageal reflux disease)   . Headache    migraines - none over 10 yrs  . Heart murmur   . History of hiatal hernia   . Hypertension   . Pancreatitis    Valproic acid  . Vertigo     Past Surgical History:  Procedure Laterality Date  . ABDOMINAL HYSTERECTOMY  1990 ?  . AV FISTULA PLACEMENT  4/30  . BREAST EXCISIONAL BIOPSY Left 1994   neg surgical bx  . CHOLECYSTECTOMY  2003  . COLONOSCOPY  2014   Dr. Jamal [facility]  . COLONOSCOPY WITH PROPOFOL N/A 09/24/2015   Procedure: COLONOSCOPY WITH random colon byopies.;  Surgeon: Lucilla Lame, MD;  Location: [facility];  Service: Endoscopy;  Laterality: N/A;  . ESOPHAGOGASTRODUODENOSCOPY (EGD) WITH PROPOFOL N/A 09/24/2015   Procedure: ESOPHAGOGASTRODUODENOSCOPY (EGD) ;  Surgeon: Lucilla Lame, MD;  Location: [place];  Service: Endoscopy;  Laterality: N/A;  . EYE SURGERY    . PERIPHERAL VASCULAR CATHETERIZATION N/A 05/29/2015   Procedure: A/V  Shuntogram/Fistulagram;  Surgeon: Katha Cabal, MD;  Location: [place] [facility];  Service: Cardiovascular;  Laterality: N/A;  . PERIPHERAL VASCULAR CATHETERIZATION N/A 05/29/2015   Procedure: A/V Shunt Intervention;  Surgeon: Katha Cabal, MD;  Location: [facility];  Service: Cardiovascular;  Laterality: N/A;  . POLYPECTOMY  09/24/2015   Procedure: POLYPECTOMY INTESTINAL;  Surgeon: Lucilla Lame, MD;  Location: [facility] [place];  Service: Endoscopy;;  cecal polyp ascending polyp       History of present illness and  Hospital Course:     Kindly see H&P for history of present illness and admission details, please review complete Labs, Consult reports and Test reports for all details in brief  HPI  from the history and physical done on the day of admission Joyce [name] is a 71 y.o. year old kidney disease, chronic kidney disease stage IV admitted 2 days ago with gross hematuria. , she developed gross hematuria and dysuria.  The following day, she fell and thought that she had ruptured one of her renal cysts. The bleeding failed to resolve, she ultimately presented to the emergency room and was admitted.  She denies any significant flank pain. She did have temp to 101.1 early during this admission.   Hospital Course  1 hematuria secondary to Escherichia coli UTI: Sensitive to by mouth antibiotics, discharged home with Keflex 250 mg by mouth twice a day4 days, patient received IV Rocephin for 4 days. Patient to be finished the antibiotic 4 days to complete8 days of  treatment.  #2.ckdStage IV: Seen by Dr. Holley Raring from nephrology, patient creatinine on admission 5.0, improved with IV hydration, Creatinine down to 4.13 . Sent feels better. Hemoglobin stable at 10.  Hematuria seen by urology Dr. [facility] and thinks that her gross hematuria likely secondary to UTI. For her ongoing hematuria workup patient is to follow-up with them in 1 month to evaluate for microscopic  hematuria, hematuria workup.  #3 hypertension;bp Was low at home but the blood pressure better now,continue verapamil.     Discharge Condition:    Follow UP  Follow-up Information    [facility], MD Follow up in 1 month(s).   Specialty:  Urology Contact information: [place] [address] [place], [place] Follow up in 1 week(s).   Specialty:  Internal Medicine Contact information: [place] [address] [phone]             Discharge Instructions  and  Discharge Medications      Allergies as of 07/31/2016      Reactions   Indomethacin Hives   Pollen Extract Other (See Comments)   Sinus problems and HA      Medication List    STOP taking these medications   acetaminophen 325 MG tablet Commonly known as:  TYLENOL   lisinopril 20 MG tablet Commonly known as:  PRINIVIL,ZESTRIL     TAKE these medications   bimatoprost 0.01 % Soln Commonly known as:  LUMIGAN Place 1 drop into both eyes at bedtime.   carbamazepine 200 MG 12 hr tablet Commonly known as:  TEGRETOL XR Take 200 mg by mouth 2 (two) times daily.   cephALEXin 250 MG capsule Commonly known as:  KEFLEX Take 1 capsule (250 mg total) by mouth 2 (two) times daily.   cholecalciferol 1000 units tablet Commonly known as:  VITAMIN D Take 1,000 Units by mouth daily.   cyanocobalamin 1000 MCG/ML injection Commonly known as:  (VITAMIN B-12) Inject 1,000 mcg into the muscle every 30 (thirty) days.   gabapentin 100 MG capsule Commonly known as:  NEURONTIN Take 100 mg by mouth at bedtime.   pantoprazole 40 MG tablet Commonly known as:  PROTONIX Take 1 tablet (40 mg total) by mouth daily.   QUEtiapine 25 MG tablet Commonly known as:  SEROQUEL Take 50 mg by mouth at bedtime. She takes two 25mg  tabs at bedtime   timolol 0.5 % ophthalmic solution Commonly known as:  TIMOPTIC Place 1 drop into both eyes 2 (two) times  daily.   verapamil 240 MG CR tablet Commonly known as:  CALAN-SR Take 240 mg by mouth at bedtime.         Diet and Activity recommendation: See Discharge Instructions above   Consults obtained -urology,nephorology.   Major procedures and Radiology Reports - PLEASE review detailed and final reports for all details, in brief -     Dg Lumbar Spine Complete  Result Date: 07/22/2016 CLINICAL DATA:  Pt states she has been having lateral left hip pain for 2 months. Posterior right hip pain that radiates to anterior AND hurts to lift right leg that just started 2 days. Sciatic lumbar pain x 2 days. Hx of gallbladder removal. EXAM: LUMBAR SPINE - COMPLETE 4+ VIEW COMPARISON:  CT, 05/10/2014 FINDINGS: No fracture.  No bone lesion.  No spondylolisthesis. Disc spaces are relatively well maintained. Small endplate osteophytes noted at L1-L2 and at L4-L5. Mild facet degenerative change on the left  at L5-S1. Bones demineralized.  No bone lesions. Soft tissues are unremarkable. IMPRESSION: 1. No fracture, bone lesion or acute finding. 2. Minor degenerative changes. Stable appearance from the prior CT scan. Electronically Signed   By: Lajean Manes M.D.   On: 07/22/2016 11:22   Ct Renal Stone Study  Result Date: 07/28/2016 CLINICAL DATA:  Hematuria beginning 3 days ago. EXAM: CT ABDOMEN AND PELVIS WITHOUT CONTRAST TECHNIQUE: Multidetector CT imaging of the abdomen and pelvis was performed following the standard protocol without IV contrast. COMPARISON:  05/10/2014 FINDINGS: Lower chest: Negative except for a small amount of pericardial fluid. Hepatobiliary: Liver is normal without contrast. Previous cholecystectomy. Pancreas: Normal Spleen: Normal Adrenals/Urinary Tract: Adrenal glands are normal. The patient has polycystic kidney disease with innumerable cysts bilaterally of varying density. Some are associated with punctate calcifications. There is no evidence of hydronephrosis or passing stone. No stone  in the bladder. Stomach/Bowel: No acute bowel finding.  Left colon diverticulosis. Vascular/Lymphatic: Aortic atherosclerosis.  No aneurysm. Reproductive: Previous hysterectomy. 2.5 cm cyst associated with the left ovary. Other: No free fluid or air. Musculoskeletal: Negative IMPRESSION: Polycystic kidney disease with enlarged kidneys bilaterally containing innumerable cysts. Most of the cysts are low density, but some are associated with hemorrhage. There is no sign of hydronephrosis or passing stone. No evidence of renal inflammation. Aortic atherosclerosis. Left colon diverticulosis without evidence of diverticulitis. Electronically Signed   By: Nelson Chimes M.D.   On: 07/28/2016 14:37   Dg Hip Unilat With Pelvis 2-3 Views Left  Result Date: 07/22/2016 CLINICAL DATA:  Lateral left hip pain for 2 months. EXAM: DG HIP (WITH OR WITHOUT PELVIS) 2-3V LEFT COMPARISON:  None. FINDINGS: Degenerative changes in the hips bilaterally with joint space narrowing and spurring. SI joints are symmetric and unremarkable. No acute bony abnormality. Specifically, no fracture, subluxation, or dislocation. Soft tissues are intact. IMPRESSION: Mild symmetric degenerative changes in the hips. No acute bony abnormality. Electronically Signed   By: Rolm Baptise M.D.   On: 07/22/2016 11:22   Dg Hip Unilat With Pelvis 2-3 Views Right  Result Date: 07/22/2016 CLINICAL DATA:  Pt states she has been having lateral left hip pain for 2 months. Posterior right hip pain that radiates to anterior & hurts to lift right leg that just started 2 days. Sciatic lumbar pain x 2 days. Hx of gallbladder removal 1980's, hysterectomy 1970's. No hx of injuries EXAM: DG HIP (WITH OR WITHOUT PELVIS) 2-3V RIGHT COMPARISON:  None. FINDINGS: There is no evidence of hip fracture or dislocation. There is no evidence of arthropathy or other focal bone abnormality. IMPRESSION: Negative. Electronically Signed   By: Lajean Manes M.D.   On: 07/22/2016 11:23     Micro Results     Recent Results (from the past 240 hour(s))  Urine culture     Status: Abnormal   Collection Time: 07/28/16  9:56 AM  Result Value Ref Range Status   Specimen Description URINE, RANDOM  Final   Special Requests NONE  Final   Culture >=100,000 COLONIES/mL ESCHERICHIA COLI (A)  Final   Report Status 07/31/2016 FINAL  Final   Organism ID, Bacteria ESCHERICHIA COLI (A)  Final      Susceptibility   Escherichia coli - MIC*    AMPICILLIN 8 SENSITIVE Sensitive     CEFAZOLIN <=4 SENSITIVE Sensitive     CEFTRIAXONE <=1 SENSITIVE Sensitive     CIPROFLOXACIN 1 SENSITIVE Sensitive     GENTAMICIN <=1 SENSITIVE Sensitive     IMIPENEM <=  0.25 SENSITIVE Sensitive     NITROFURANTOIN <=16 SENSITIVE Sensitive     TRIMETH/SULFA >=320 RESISTANT Resistant     AMPICILLIN/SULBACTAM 4 SENSITIVE Sensitive     PIP/TAZO <=4 SENSITIVE Sensitive     Extended ESBL NEGATIVE Sensitive     * >=100,000 COLONIES/mL ESCHERICHIA COLI       Today   Subjective:   Joyce [name] today has no headache,no chest abdominal pain,no new weakness tingling or numbness, feels much better wants to go home today.  Objective:   Blood pressure (!) 149/62, pulse 71, temperature 97.8 F (36.6 C), temperature source Oral, resp. rate 15, height 5\' 3"  (1.6 m), weight 79.8 kg (176 lb), SpO2 99 %.   Intake/Output Summary (Last 24 hours) at 07/31/16 1010 Last data filed at 07/31/16 0800  Gross per 24 hour  Intake             1430 ml  Output             2300 ml  Net             -870 ml    Exam Awake Alert, Oriented x 3, No new F.N deficits, Normal affect [place].AT,PERRAL Supple Neck,No JVD, No cervical lymphadenopathy appriciated.  Symmetrical Chest wall movement, Good air movement bilaterally, CTAB RRR,No Gallops,Rubs or new Murmurs, No Parasternal Heave +ve B.Sounds, Abd Soft, Non tender, No organomegaly appriciated, No rebound -guarding or rigidity. No Cyanosis, Clubbing or edema, No new Rash or  bruise  Data Review   CBC w Diff: Lab Results  Component Value Date   WBC 10.2 07/30/2016   HGB 10.0 (L) 07/30/2016   HGB 10.3 (L) 11/09/2014   HCT 29.8 (L) 07/30/2016   HCT 31.9 (L) 11/09/2014   PLT 363 07/30/2016   PLT 248 11/09/2014   LYMPHOPCT 23 12/27/2014   LYMPHOPCT 28.3 05/11/2014   MONOPCT 6 12/27/2014   MONOPCT 11.3 05/11/2014   EOSPCT 1 12/27/2014   EOSPCT 1.5 05/11/2014   BASOPCT 1 12/27/2014   BASOPCT 0.5 05/11/2014    CMP: Lab Results  Component Value Date   NA 139 07/30/2016   NA 139 11/09/2014   K 4.0 07/30/2016   K 4.7 11/09/2014   CL 112 (H) 07/30/2016   CL 108 11/09/2014   CO2 16 (L) 07/30/2016   CO2 25 11/09/2014   BUN 72 (H) 07/30/2016   BUN 51 (H) 11/09/2014   CREATININE 4.13 (H) 07/30/2016   CREATININE 2.98 (H) 11/09/2014   PROT 7.7 07/28/2016   PROT 5.3 (L) 05/14/2014   ALBUMIN 3.5 07/28/2016   ALBUMIN 2.7 (L) 05/14/2014   BILITOT 0.9 07/28/2016   BILITOT 0.5 05/14/2014   ALKPHOS 82 07/28/2016   ALKPHOS 53 05/14/2014   AST 22 07/28/2016   AST 18 05/14/2014   ALT 17 07/28/2016   ALT 25 05/14/2014  .   Total Time in preparing paper work, data evaluation and todays exam - 1 minutes  Joyce [name] M.D on 07/31/2016 at 10:10 AM    Note: This dictation was prepared with Dragon dictation along with smaller phrase technology. Any transcriptional errors that result from this process are unintentional.

## 2016-07-31 NOTE — Progress Notes (Signed)
May keep iv out pr dr.diamond.

## 2016-07-31 NOTE — Care Management Important Message (Signed)
Important Message  Patient Details  Name: Joyce [name] MRN: [id number] Date of Birth: 02/23/1946   Medicare Important Message Given:  Yes    Beverly Sessions, RN 07/31/2016, 3:32 PM

## 2016-08-01 NOTE — Progress Notes (Signed)
[facility]  Patient Status: patient declined HHPT, notified Isaias Cowman, Costilla Marshell Garfinkel, [place] 08/01/2016, 11:13 AM

## 2016-08-11 DIAGNOSIS — I10 Essential (primary) hypertension: Secondary | ICD-10-CM | POA: Diagnosis not present

## 2016-08-11 DIAGNOSIS — Q613 Polycystic kidney, unspecified: Secondary | ICD-10-CM | POA: Diagnosis not present

## 2016-08-11 DIAGNOSIS — E559 Vitamin D deficiency, unspecified: Secondary | ICD-10-CM | POA: Diagnosis not present

## 2016-08-11 DIAGNOSIS — N185 Chronic kidney disease, stage 5: Secondary | ICD-10-CM | POA: Diagnosis not present

## 2016-08-13 DIAGNOSIS — N2581 Secondary hyperparathyroidism of renal origin: Secondary | ICD-10-CM | POA: Diagnosis not present

## 2016-08-13 DIAGNOSIS — D631 Anemia in chronic kidney disease: Secondary | ICD-10-CM | POA: Diagnosis not present

## 2016-08-13 DIAGNOSIS — F3177 Bipolar disorder, in partial remission, most recent episode mixed: Secondary | ICD-10-CM | POA: Diagnosis not present

## 2016-08-13 DIAGNOSIS — I10 Essential (primary) hypertension: Secondary | ICD-10-CM | POA: Diagnosis not present

## 2016-08-13 DIAGNOSIS — N185 Chronic kidney disease, stage 5: Secondary | ICD-10-CM | POA: Diagnosis not present

## 2016-08-15 DIAGNOSIS — H401132 Primary open-angle glaucoma, bilateral, moderate stage: Secondary | ICD-10-CM | POA: Diagnosis not present

## 2016-08-15 DIAGNOSIS — Z79899 Other long term (current) drug therapy: Secondary | ICD-10-CM | POA: Diagnosis not present

## 2016-08-21 ENCOUNTER — Other Ambulatory Visit: Payer: Self-pay | Admitting: Gastroenterology

## 2016-09-01 ENCOUNTER — Ambulatory Visit: Payer: Medicare HMO | Admitting: Urology

## 2016-09-11 DIAGNOSIS — I10 Essential (primary) hypertension: Secondary | ICD-10-CM | POA: Diagnosis not present

## 2016-09-11 DIAGNOSIS — N185 Chronic kidney disease, stage 5: Secondary | ICD-10-CM | POA: Diagnosis not present

## 2016-09-11 DIAGNOSIS — E559 Vitamin D deficiency, unspecified: Secondary | ICD-10-CM | POA: Diagnosis not present

## 2016-09-11 DIAGNOSIS — Q613 Polycystic kidney, unspecified: Secondary | ICD-10-CM | POA: Diagnosis not present

## 2016-09-15 ENCOUNTER — Encounter (INDEPENDENT_AMBULATORY_CARE_PROVIDER_SITE_OTHER): Payer: Self-pay

## 2016-09-15 ENCOUNTER — Ambulatory Visit (INDEPENDENT_AMBULATORY_CARE_PROVIDER_SITE_OTHER): Payer: Self-pay | Admitting: Vascular Surgery

## 2016-09-15 DIAGNOSIS — N185 Chronic kidney disease, stage 5: Secondary | ICD-10-CM | POA: Diagnosis not present

## 2016-09-15 DIAGNOSIS — D631 Anemia in chronic kidney disease: Secondary | ICD-10-CM | POA: Diagnosis not present

## 2016-09-15 DIAGNOSIS — I10 Essential (primary) hypertension: Secondary | ICD-10-CM | POA: Diagnosis not present

## 2016-09-15 DIAGNOSIS — N2581 Secondary hyperparathyroidism of renal origin: Secondary | ICD-10-CM | POA: Diagnosis not present

## 2016-09-24 ENCOUNTER — Encounter: Payer: Self-pay | Admitting: Hematology and Oncology

## 2016-09-24 ENCOUNTER — Inpatient Hospital Stay: Payer: Medicare HMO

## 2016-09-24 ENCOUNTER — Inpatient Hospital Stay: Payer: Medicare HMO | Attending: Hematology and Oncology | Admitting: Hematology and Oncology

## 2016-09-24 VITALS — BP 134/76 | HR 61 | Temp 97.2°F | Resp 18 | Ht 63.0 in | Wt 178.4 lb

## 2016-09-24 DIAGNOSIS — D509 Iron deficiency anemia, unspecified: Secondary | ICD-10-CM | POA: Insufficient documentation

## 2016-09-24 DIAGNOSIS — Z79899 Other long term (current) drug therapy: Secondary | ICD-10-CM | POA: Insufficient documentation

## 2016-09-24 DIAGNOSIS — R5383 Other fatigue: Secondary | ICD-10-CM | POA: Insufficient documentation

## 2016-09-24 DIAGNOSIS — N189 Chronic kidney disease, unspecified: Secondary | ICD-10-CM | POA: Insufficient documentation

## 2016-09-24 DIAGNOSIS — K317 Polyp of stomach and duodenum: Secondary | ICD-10-CM | POA: Diagnosis not present

## 2016-09-24 DIAGNOSIS — F319 Bipolar disorder, unspecified: Secondary | ICD-10-CM | POA: Insufficient documentation

## 2016-09-24 DIAGNOSIS — K5730 Diverticulosis of large intestine without perforation or abscess without bleeding: Secondary | ICD-10-CM | POA: Insufficient documentation

## 2016-09-24 DIAGNOSIS — D120 Benign neoplasm of cecum: Secondary | ICD-10-CM

## 2016-09-24 DIAGNOSIS — N185 Chronic kidney disease, stage 5: Secondary | ICD-10-CM | POA: Diagnosis not present

## 2016-09-24 DIAGNOSIS — Z86010 Personal history of colonic polyps: Secondary | ICD-10-CM | POA: Insufficient documentation

## 2016-09-24 DIAGNOSIS — D122 Benign neoplasm of ascending colon: Secondary | ICD-10-CM | POA: Diagnosis not present

## 2016-09-24 DIAGNOSIS — I120 Hypertensive chronic kidney disease with stage 5 chronic kidney disease or end stage renal disease: Secondary | ICD-10-CM

## 2016-09-24 DIAGNOSIS — B9620 Unspecified Escherichia coli [E. coli] as the cause of diseases classified elsewhere: Secondary | ICD-10-CM | POA: Diagnosis not present

## 2016-09-24 DIAGNOSIS — K219 Gastro-esophageal reflux disease without esophagitis: Secondary | ICD-10-CM | POA: Diagnosis not present

## 2016-09-24 DIAGNOSIS — D631 Anemia in chronic kidney disease: Secondary | ICD-10-CM | POA: Diagnosis not present

## 2016-09-24 DIAGNOSIS — K449 Diaphragmatic hernia without obstruction or gangrene: Secondary | ICD-10-CM | POA: Insufficient documentation

## 2016-09-24 LAB — CBC WITH DIFFERENTIAL/PLATELET
Basophils Absolute: 0 10*3/uL (ref 0–0.1)
Basophils Relative: 1 %
Eosinophils Absolute: 0.2 10*3/uL (ref 0–0.7)
Eosinophils Relative: 4 %
HCT: 30.3 % — ABNORMAL LOW (ref 35.0–47.0)
Hemoglobin: 9.9 g/dL — ABNORMAL LOW (ref 12.0–16.0)
Lymphocytes Relative: 26 %
Lymphs Abs: 1.3 10*3/uL (ref 1.0–3.6)
MCH: 30.3 pg (ref 26.0–34.0)
MCHC: 32.7 g/dL (ref 32.0–36.0)
MCV: 92.5 fL (ref 80.0–100.0)
Monocytes Absolute: 0.3 10*3/uL (ref 0.2–0.9)
Monocytes Relative: 5 %
Neutro Abs: 3.2 10*3/uL (ref 1.4–6.5)
Neutrophils Relative %: 64 %
Platelets: 338 10*3/uL (ref 150–440)
RBC: 3.27 MIL/uL — ABNORMAL LOW (ref 3.80–5.20)
RDW: 15 % — ABNORMAL HIGH (ref 11.5–14.5)
WBC: 5.1 10*3/uL (ref 3.6–11.0)

## 2016-09-24 NOTE — Progress Notes (Signed)
Patient here today as new evaluation regarding anemia.  Referred by Dr. Holley Raring.

## 2016-09-24 NOTE — Progress Notes (Signed)
[facility] day:  09/24/2016  Chief Complaint: Joyce [name] is a 71 y.o. female with anemia of chronic kidney disease who is referred in consultation by Dr. Anthonette Legato for assessment and management.  HPI:  The patient was diagnosed with polycystic kidney disease in the 1970s.  She has been followed closely by nephrology.  For the past 8 years, she has seen Dr. Holley Raring.  Her renal function has declined over the years.  She was admitted to [facility] 07/28/2016 - 07/31/2016 with dysuria and hematuria for 6-7 days following a fall.  She was noted to have an E coli UTI.  She was treated with ceftriaxone then oral Keflex.  Creatinine improved with hydration from 5.5 to 4.13.  Hemoglobin was stable between 9.0 - 10.2.  She states that she has not been able to "pull up my energy" since her hospitalization.  CBC on 09/11/2016 revealed a hematocrit of 29.0, hemoglobin 9.3, MCV 92, platelets 262,000, white count 5,000 with an ANC of 3,500.  Creatinine was 3.65 (CrCl 12 ml/min).   Additional labs include the following:  Hematocrit 30.8, hemoglobin 9.7 and creatinine 3.37 on 07/17/2016.  Hematocrit 29.4, hemoglobin 9.1, and creatinine 3.22 on 08/11/2016.  She was seen by Dr. Holley Raring on 09/15/2016.  She was fatigued.  She was felt to benefit from consideration of Epogen.  She notes a distant history of iron deficiency anemia.  In the past, she was on oral ion.  Diet is good.  She eats meat 2-3 times a week as well as green leafy vegetables.  She has never required a transfusion.    She underwent EGD and colonoscopy by Dr. Allen Norris on 09/24/2015.  EGD revealed Z-line irregular, at the gastroesophageal junction.  There were a few gastric polyps, resected and retrieved.  Colonoscopy revealed sigmoid diverticulosis and 2 small polyps (cecum and ascending colon).  Symptomatically, she notes fatigue.  She denies any hematuria, melena or hematochezia.   Past Medical  History:  Diagnosis Date  . Anal fissure   . Bipolar affective disorder ([place])   . CKD (chronic kidney disease)    Dr Holley Raring [name] 4  . Colon polyps   . Diverticulitis   . Diverticulitis   . Family history of adverse reaction to anesthesia    mom - PONV  . GERD (gastroesophageal reflux disease)   . Headache    migraines - none over 10 yrs  . Heart murmur   . History of hiatal hernia   . Hypertension   . Pancreatitis    Valproic acid  . Vertigo     Past Surgical History:  Procedure Laterality Date  . ABDOMINAL HYSTERECTOMY  1990 ?  . AV FISTULA PLACEMENT  4/30  . BREAST EXCISIONAL BIOPSY Left 1994   neg surgical bx  . CHOLECYSTECTOMY  2003  . COLONOSCOPY  2014   Dr. Jamal [facility]  . COLONOSCOPY WITH PROPOFOL N/A 09/24/2015   Procedure: COLONOSCOPY WITH random colon byopies.;  Surgeon: Lucilla Lame, MD;  Location: [facility];  Service: Endoscopy;  Laterality: N/A;  . ESOPHAGOGASTRODUODENOSCOPY (EGD) WITH PROPOFOL N/A 09/24/2015   Procedure: ESOPHAGOGASTRODUODENOSCOPY (EGD) ;  Surgeon: Lucilla Lame, MD;  Location: [facility];  Service: Endoscopy;  Laterality: N/A;  . EYE SURGERY    . PERIPHERAL VASCULAR CATHETERIZATION N/A 05/29/2015   Procedure: A/V Shuntogram/Fistulagram;  Surgeon: Katha Cabal, MD;  Location: [place] [facility];  Service: Cardiovascular;  Laterality: N/A;  . PERIPHERAL VASCULAR CATHETERIZATION N/A  05/29/2015   Procedure: A/V Shunt Intervention;  Surgeon: Katha Cabal, MD;  Location: [place] [facility];  Service: Cardiovascular;  Laterality: N/A;  . POLYPECTOMY  09/24/2015   Procedure: POLYPECTOMY INTESTINAL;  Surgeon: Lucilla Lame, MD;  Location: [place];  Service: Endoscopy;;  cecal polyp ascending polyp    Family History  Problem Relation Age of Onset  . Stroke Father   . Hypertension Father   . Breast cancer Other   . Colon cancer Neg Hx   . Liver disease Neg Hx     Social History:  reports that she has never smoked.  She has never used smokeless tobacco. She reports that she does not drink alcohol or use drugs.  She previously worked for [employer].  She works in a Production designer, theatre/television/film as a Electrical engineer.  Early AM appointments are the best for her.  The patient is alone today.  Allergies:  Allergies  Allergen Reactions  . Indomethacin Hives  . Pollen Extract Other (See Comments)    Sinus problems and HA    Current Medications: Current Outpatient Prescriptions  Medication Sig Dispense Refill  . bimatoprost (LUMIGAN) 0.01 % SOLN Place 1 drop into both eyes at bedtime.    . brimonidine (ALPHAGAN) 0.2 % ophthalmic solution 3 (three) times daily.    . calcitRIOL (ROCALTROL) 0.25 MCG capsule Take 0.25 mcg by mouth daily.    . carbamazepine (TEGRETOL XR) 200 MG 12 hr tablet Take 200 mg by mouth 2 (two) times daily.    . cholecalciferol (VITAMIN D) 1000 UNITS tablet Take 1,000 Units by mouth daily.    . cyanocobalamin (,VITAMIN B-12,) 1000 MCG/ML injection Inject 1,000 mcg into the muscle every 30 (thirty) days.    Marland Kitchen omeprazole (PRILOSEC) 20 MG capsule Take 20 mg by mouth daily.    . QUEtiapine (SEROQUEL) 25 MG tablet Take 50 mg by mouth at bedtime. She takes two 25mg  tabs at bedtime     . timolol (BETIMOL) 0.25 % ophthalmic solution 1-2 drops 2 (two) times daily.    . timolol (TIMOPTIC) 0.5 % ophthalmic solution Place 1 drop into both eyes 2 (two) times daily.    . verapamil (CALAN-SR) 240 MG CR tablet Take 240 mg by mouth at bedtime.     Marland Kitchen acetaminophen (TYLENOL) 325 MG tablet Take 325 mg by mouth every 8 (eight) hours as needed.    . cephALEXin (KEFLEX) 250 MG capsule Take 1 capsule (250 mg total) by mouth 2 (two) times daily. (Patient not taking: Reported on 09/24/2016) 8 capsule 0  . gabapentin (NEURONTIN) 100 MG capsule Take 100 mg by mouth at bedtime.    . pantoprazole (PROTONIX) 40 MG tablet Take 1 tablet (40 mg total) by mouth daily. **PT NEEDS FOLLOW UP APPT** (Patient not taking:  Reported on 09/24/2016) 90 tablet 0   No current facility-administered medications for this visit.     Review of Systems:  GENERAL:  Fatigue.  Active.  No fevers or sweats.  "Little weight loss". PERFORMANCE STATUS (ECOG):  1 HEENT:  Vision gone in left eye secondary to glaucoma.  No runny nose, sore throat, mouth sores or tenderness. Lungs: No shortness of breath or cough.  No hemoptysis. Cardiac:  No chest pain, palpitations, orthopnea, or PND. GI:  Improved appetite after hospitalization.  No nausea, vomiting, diarrhea, constipation, melena or hematochezia.  Colonoscopy and EGD 09/24/2015. GU:  Polycystic kidney disease.  No urgency, frequency, dysuria, or hematuria. Musculoskeletal:  Arthritis in legs and  back.  No muscle tenderness. Extremities:  No pain or swelling. Skin:  No rashes or skin changes. Neuro:  No headache, numbness or weakness, balance or coordination issues.  Restless legs. Endocrine:  No diabetes, thyroid issues, hot flashes or night sweats. Psych:  No mood changes, depression or anxiety. Pain:  No focal pain. Review of systems:  All other systems reviewed and found to be negative.   Physical Exam: Blood pressure 134/76, pulse 61, temperature 97.2 F (36.2 C), temperature source Tympanic, resp. rate 18, height 5\' 3"  (1.6 m), weight 178 lb 5.6 oz (80.9 kg). GENERAL:  Well developed, well nourished, slightly pale woman sitting comfortably in the exam room in no acute distress. MENTAL STATUS:  Alert and oriented to person, place and time. HEAD:  Styled short strawberry blonde hair with graying.  Normocephalic, atraumatic, face symmetric, no Cushingoid features. EYES:  Glasses.  Blue eyes.  Pupils equal round and reactive to light and accomodation.  No conjunctivitis or scleral icterus. ENT:  Oropharynx clear without lesion.  Tongue normal. Mucous membranes moist.  RESPIRATORY:  Clear to auscultation without rales, wheezes or rhonchi. CARDIOVASCULAR:  Regular rate and  rhythm without murmur, rub or gallop. ABDOMEN:  Soft, non-tender, with active bowel sounds, and no hepatosplenomegaly.  No masses. SKIN:  No rashes, ulcers or lesions. EXTREMITIES: No edema, no skin discoloration or tenderness.  No palpable cords. LYMPH NODES: No palpable cervical, supraclavicular, axillary or inguinal adenopathy  NEUROLOGICAL: Unremarkable. PSYCH:  Appropriate.   Office Visit on 09/24/2016  Component Date Value Ref Range Status  . WBC 09/24/2016 5.1  3.6 - 11.0 K/uL Final  . RBC 09/24/2016 3.27* 3.80 - 5.20 MIL/uL Final  . Hemoglobin 09/24/2016 9.9* 12.0 - 16.0 g/dL Final  . HCT 09/24/2016 30.3* 35.0 - 47.0 % Final  . MCV 09/24/2016 92.5  80.0 - 100.0 fL Final  . MCH 09/24/2016 30.3  26.0 - 34.0 pg Final  . MCHC 09/24/2016 32.7  32.0 - 36.0 g/dL Final  . RDW 09/24/2016 15.0* 11.5 - 14.5 % Final  . Platelets 09/24/2016 338  150 - 440 K/uL Final  . Neutrophils Relative % 09/24/2016 64  % Final  . Neutro Abs 09/24/2016 3.2  1.4 - 6.5 K/uL Final  . Lymphocytes Relative 09/24/2016 26  % Final  . Lymphs Abs 09/24/2016 1.3  1.0 - 3.6 K/uL Final  . Monocytes Relative 09/24/2016 5  % Final  . Monocytes Absolute 09/24/2016 0.3  0.2 - 0.9 K/uL Final  . Eosinophils Relative 09/24/2016 4  % Final  . Eosinophils Absolute 09/24/2016 0.2  0 - 0.7 K/uL Final  . Basophils Relative 09/24/2016 1  % Final  . Basophils Absolute 09/24/2016 0.0  0 - 0.1 K/uL Final    Assessment:  Joyce [name] is a 71 y.o. female with polycystic kidney disease and anemia associated with stage V chronic renal failure.  She was diagnosed with polycystic kidney disease 1970s.  CBC on 09/11/2016 revealed a hematocrit of 29.0, hemoglobin 9.3, MCV 92, platelets 2 2000, white count 5000 with an ANC of 3500.  Creatinine was 3.65 (CrCl 12 ml/min).   She has a distant history of iron deficiency anemia.  In the past, she was on oral ion.  Diet is good.  She has never required a transfusion.    EGD on  09/24/2015 revealed Z-line irregular, at the gastroesophageal junction.  There were a few gastric polyps, resected and retrieved.  Colonoscopy on 09/24/2015 revealed sigmoid diverticulosis and 2 small polyps (  cecum and ascending colon).  Symptomatically, she notes fatigue.  She denies any hematuria, melena or hematochezia.  Plan: 1.  Labs:  CBC with diff, ferritin, iron studies, B12, folate, TSH. 2.  Discuss anemia of chronic renal disease.  Discuss management with Procrit.  Side effects reviewed. 3.  Preauth Procrit.  Begin when hemoglobin < 10. 4.  RTC every 2 weeks at 8:30 AM for Hgb +/- Procrit 5.  RTC on week 6 for MD assessment, labs (CBC), and +/- Procit   Lequita Asal, MD  09/24/2016, 4:06 PM

## 2016-09-24 NOTE — Patient Instructions (Signed)
Epoetin Alfa injection °What is this medicine? °EPOETIN ALFA (e POE e tin AL fa) helps your body make more red blood cells. This medicine is used to treat anemia caused by chronic kidney failure, cancer chemotherapy, or HIV-therapy. It may also be used before surgery if you have anemia. °This medicine may be used for other purposes; ask your health care provider or pharmacist if you have questions. °COMMON BRAND NAME(S): Epogen, Procrit °What should I tell my health care provider before I take this medicine? °They need to know if you have any of these conditions: °-blood clotting disorders °-cancer patient not on chemotherapy °-cystic fibrosis °-heart disease, such as angina or heart failure °-hemoglobin level of 12 g/dL or greater °-high blood pressure °-low levels of folate, iron, or vitamin B12 °-seizures °-an unusual or allergic reaction to erythropoietin, albumin, benzyl alcohol, hamster proteins, other medicines, foods, dyes, or preservatives °-pregnant or trying to get pregnant °-breast-feeding °How should I use this medicine? °This medicine is for injection into a vein or under the skin. It is usually given by a health care professional in a hospital or clinic setting. °If you get this medicine at home, you will be taught how to prepare and give this medicine. Use exactly as directed. Take your medicine at regular intervals. Do not take your medicine more often than directed. °It is important that you put your used needles and syringes in a special sharps container. Do not put them in a trash can. If you do not have a sharps container, call your pharmacist or healthcare provider to get one. °A special MedGuide will be given to you by the pharmacist with each prescription and refill. Be sure to read this information carefully each time. °Talk to your pediatrician regarding the use of this medicine in children. While this drug may be prescribed for selected conditions, precautions do apply. °Overdosage: If you  think you have taken too much of this medicine contact a poison control center or emergency room at once. °NOTE: This medicine is only for you. Do not share this medicine with others. °What if I miss a dose? °If you miss a dose, take it as soon as you can. If it is almost time for your next dose, take only that dose. Do not take double or extra doses. °What may interact with this medicine? °Do not take this medicine with any of the following medications: °-darbepoetin alfa °This list may not describe all possible interactions. Give your health care provider a list of all the medicines, herbs, non-prescription drugs, or dietary supplements you use. Also tell them if you smoke, drink alcohol, or use illegal drugs. Some items may interact with your medicine. °What should I watch for while using this medicine? °Your condition will be monitored carefully while you are receiving this medicine. °You may need blood work done while you are taking this medicine. °What side effects may I notice from receiving this medicine? °Side effects that you should report to your doctor or health care professional as soon as possible: °-allergic reactions like skin rash, itching or hives, swelling of the face, lips, or tongue °-breathing problems °-changes in vision °-chest pain °-confusion, trouble speaking or understanding °-feeling faint or lightheaded, falls °-high blood pressure °-muscle aches or pains °-pain, swelling, warmth in the leg °-rapid weight gain °-severe headaches °-sudden numbness or weakness of the face, arm or leg °-trouble walking, dizziness, loss of balance or coordination °-seizures (convulsions) °-swelling of the ankles, feet, hands °-unusually weak or tired °  Side effects that usually do not require medical attention (report to your doctor or health care professional if they continue or are bothersome): °-diarrhea °-fever, chills (flu-like symptoms) °-headaches °-nausea, vomiting °-redness, stinging, or swelling at  site where injected °This list may not describe all possible side effects. Call your doctor for medical advice about side effects. You may report side effects to FDA at 1-800-FDA-1088. °Where should I keep my medicine? °Keep out of the reach of children. °Store in a refrigerator between 2 and 8 degrees C (36 and 46 degrees F). Do not freeze or shake. Throw away any unused portion if using a single-dose vial. Multi-dose vials can be kept in the refrigerator for up to 21 days after the initial dose. Throw away unused medicine. °NOTE: This sheet is a summary. It may not cover all possible information. If you have questions about this medicine, talk to your doctor, pharmacist, or health care provider. °© 2018 [employer] (2016-02-25 19:42:31) ° °

## 2016-09-25 ENCOUNTER — Other Ambulatory Visit: Payer: Self-pay | Admitting: Hematology and Oncology

## 2016-09-25 LAB — FOLATE: Folate: 9 ng/mL (ref >5.9)

## 2016-09-25 LAB — VITAMIN B12: Vitamin B-12: 390 pg/mL (ref 180–914)

## 2016-09-25 LAB — TSH: TSH: 2.682 u[IU]/mL (ref 0.350–4.500)

## 2016-09-25 LAB — IRON AND TIBC
Iron: 88 ug/dL (ref 28–170)
Saturation Ratios: 30 % (ref 10.4–31.8)
TIBC: 297 ug/dL (ref 250–450)
UIBC: 209 ug/dL

## 2016-09-25 LAB — FERRITIN: Ferritin: 42 ng/mL (ref 11–307)

## 2016-09-30 ENCOUNTER — Telehealth: Payer: Self-pay | Admitting: *Deleted

## 2016-09-30 ENCOUNTER — Other Ambulatory Visit (INDEPENDENT_AMBULATORY_CARE_PROVIDER_SITE_OTHER): Payer: Self-pay | Admitting: Vascular Surgery

## 2016-09-30 DIAGNOSIS — T829XXD Unspecified complication of cardiac and vascular prosthetic device, implant and graft, subsequent encounter: Secondary | ICD-10-CM

## 2016-09-30 DIAGNOSIS — N185 Chronic kidney disease, stage 5: Secondary | ICD-10-CM

## 2016-09-30 DIAGNOSIS — H401132 Primary open-angle glaucoma, bilateral, moderate stage: Secondary | ICD-10-CM | POA: Diagnosis not present

## 2016-09-30 NOTE — Telephone Encounter (Signed)
Called patient and in formed her of her lab results and that she should be fine to receive her procrit as scheduled.

## 2016-10-01 ENCOUNTER — Ambulatory Visit (INDEPENDENT_AMBULATORY_CARE_PROVIDER_SITE_OTHER): Payer: Medicare HMO

## 2016-10-01 ENCOUNTER — Ambulatory Visit
Admission: RE | Admit: 2016-10-01 | Discharge: 2016-10-01 | Disposition: A | Discharge status: Home / Self Care | Payer: Medicare HMO | Source: Ambulatory Visit | Attending: Internal Medicine | Admitting: Internal Medicine

## 2016-10-01 ENCOUNTER — Ambulatory Visit (INDEPENDENT_AMBULATORY_CARE_PROVIDER_SITE_OTHER): Payer: Medicare HMO | Admitting: Vascular Surgery

## 2016-10-01 ENCOUNTER — Encounter (INDEPENDENT_AMBULATORY_CARE_PROVIDER_SITE_OTHER): Payer: Self-pay | Admitting: Vascular Surgery

## 2016-10-01 VITALS — BP 220/87 | HR 74 | Resp 16 | Ht 63.0 in | Wt 178.0 lb

## 2016-10-01 DIAGNOSIS — N189 Chronic kidney disease, unspecified: Secondary | ICD-10-CM | POA: Insufficient documentation

## 2016-10-01 DIAGNOSIS — I10 Essential (primary) hypertension: Secondary | ICD-10-CM | POA: Diagnosis not present

## 2016-10-01 DIAGNOSIS — Z1231 Encounter for screening mammogram for malignant neoplasm of breast: Secondary | ICD-10-CM | POA: Insufficient documentation

## 2016-10-01 DIAGNOSIS — T829XXD Unspecified complication of cardiac and vascular prosthetic device, implant and graft, subsequent encounter: Secondary | ICD-10-CM | POA: Diagnosis not present

## 2016-10-01 DIAGNOSIS — N185 Chronic kidney disease, stage 5: Secondary | ICD-10-CM | POA: Diagnosis not present

## 2016-10-01 DIAGNOSIS — I159 Secondary hypertension, unspecified: Secondary | ICD-10-CM | POA: Diagnosis not present

## 2016-10-01 DIAGNOSIS — J3089 Other allergic rhinitis: Secondary | ICD-10-CM | POA: Diagnosis not present

## 2016-10-01 DIAGNOSIS — R631 Polydipsia: Secondary | ICD-10-CM | POA: Diagnosis not present

## 2016-10-01 DIAGNOSIS — H8149 Vertigo of central origin, unspecified ear: Secondary | ICD-10-CM | POA: Diagnosis not present

## 2016-10-01 NOTE — Progress Notes (Signed)
Subjective:    Patient ID: Joyce [name], female    DOB: 03/27/1946, 71 y.o.   MRN: [id number] Chief Complaint  Patient presents with  . Re-evaluation    6 month HDA ultrasound follow up   Patient presents for a six month hemodialysis access follow up. The patient has not started hemodialysis as the patients kidney function has remained stable. The patient underwent a duplex ultrasound of the AV access which was notable for a patent fistula without any significant hemodynamic stenosis. Two branches are present. The patient denies any fistula skin breakdown, pain, edema, pallor or ulceration of the arm / hand.   Review of Systems  Constitutional: Negative.   HENT: Negative.   Eyes: Negative.   Respiratory: Negative.   Cardiovascular: Negative.   Gastrointestinal: Negative.   Endocrine: Negative.   Genitourinary:       Chronic Kidney Disease  Musculoskeletal: Negative.   Skin: Negative.   Allergic/Immunologic: Negative.   Neurological: Negative.   Hematological: Negative.   Psychiatric/Behavioral: Negative.       Objective:   Physical Exam  Constitutional: She is oriented to person, place, and time. She appears well-developed and well-nourished.  HENT:  Head: Normocephalic and atraumatic.  Right Ear: External ear normal.  Left Ear: External ear normal.  Eyes: Conjunctivae and EOM are normal. Pupils are equal, round, and reactive to light.  Neck: Normal range of motion.  Cardiovascular: Normal rate, regular rhythm, normal heart sounds and intact distal pulses.   Pulses:      Radial pulses are 2+ on the right side, and 2+ on the left side.  Left Upper Extremity Fistula: Skin intact. Good Bruit and Thrill  Pulmonary/Chest: Effort normal and breath sounds normal.  Musculoskeletal: Normal range of motion. She exhibits no edema.  Neurological: She is alert and oriented to person, place, and time.  Skin: Skin is warm and dry.  Psychiatric: She has a normal mood and affect.  Her behavior is normal. Judgment and thought content normal.   BP (!) 220/87 (BP Location: Right Arm)   Pulse 74   Resp 16   Ht 5\' 3"  (1.6 m)   Wt 178 lb (80.7 kg)   BMI 31.53 kg/m   Past Medical History:  Diagnosis Date  . Anal fissure   . Bipolar affective disorder ([place])   . CKD (chronic kidney disease)    Dr Holley Raring [name] 4  . Colon polyps   . Diverticulitis   . Diverticulitis   . Family history of adverse reaction to anesthesia    mom - PONV  . GERD (gastroesophageal reflux disease)   . Headache    migraines - none over 10 yrs  . Heart murmur   . History of hiatal hernia   . Hypertension   . Pancreatitis    Valproic acid  . Vertigo    Social History   Social History  . Marital status: Single    Spouse name: N/A  . Number of children: 1  . Years of education: N/A   Occupational History  . Marketing executive   Social History Main Topics  . Smoking status: Never Smoker  . Smokeless tobacco: Never Used  . Alcohol use No  . Drug use: No  . Sexual activity: Not on file   Other Topics Concern  . 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  ABDOMINAL HYSTERECTOMY  1990 ?  . AV FISTULA PLACEMENT  4/30  . BREAST EXCISIONAL BIOPSY Left 1994   neg surgical bx  . CHOLECYSTECTOMY  2003  . COLONOSCOPY  2014   Dr. Jamal Collin  . COLONOSCOPY WITH PROPOFOL N/A 09/24/2015   Procedure: COLONOSCOPY WITH random colon byopies.;  Surgeon: Lucilla Lame, MD;  Location: [facility];  Service: Endoscopy;  Laterality: N/A;  . ESOPHAGOGASTRODUODENOSCOPY (EGD) WITH PROPOFOL N/A 09/24/2015   Procedure: ESOPHAGOGASTRODUODENOSCOPY (EGD) ;  Surgeon: Lucilla Lame, MD;  Location: [facility];  Service: Endoscopy;  Laterality: N/A;  . EYE SURGERY    . PERIPHERAL VASCULAR CATHETERIZATION N/A 05/29/2015   Procedure: A/V Shuntogram/Fistulagram;  Surgeon: Katha Cabal,  MD;  Location: [facility];  Service: Cardiovascular;  Laterality: N/A;  . PERIPHERAL VASCULAR CATHETERIZATION N/A 05/29/2015   Procedure: A/V Shunt Intervention;  Surgeon: Katha Cabal, MD;  Location: [facility];  Service: Cardiovascular;  Laterality: N/A;  . POLYPECTOMY  09/24/2015   Procedure: POLYPECTOMY INTESTINAL;  Surgeon: Lucilla Lame, MD;  Location: [facility];  Service: Endoscopy;;  cecal polyp ascending polyp   Family History  Problem Relation Age of Onset  . Stroke Father   . Hypertension Father   . Breast cancer Other   . Colon cancer Neg Hx   . Liver disease Neg Hx    Allergies  Allergen Reactions  . Indomethacin Hives  . Pollen Extract Other (See Comments)    Sinus problems and HA      Assessment & Plan:  Patient presents for a six month hemodialysis access follow up. The patient has not started hemodialysis as the patients kidney function has remained stable. The patient underwent a duplex ultrasound of the AV access which was notable for a patent fistula without any significant hemodynamic stenosis. Two branches are present. The patient denies any fistula skin breakdown, pain, edema, pallor or ulceration of the arm / hand.  1. Chronic kidney disease, unspecified CKD stage - Stable Patient not in need of dialysis as kidney function is stable.  Duplex with patent fistula. No indication for intervention at this time.  Patient to follow up in six months for HDA.  - VAS US DUPLEX DIALYSIS ACCESS (AVF,AVG); Future  2. Secondary hypertension - Stable Encouraged good control as its slows the progression of atherosclerotic disease  Current Outpatient Prescriptions on File Prior to Visit  Medication Sig Dispense Refill  . acetaminophen (TYLENOL) 325 MG tablet Take 325 mg by mouth every 8 (eight) hours as needed.    . bimatoprost (LUMIGAN) 0.01 % SOLN Place 1 drop into both eyes at bedtime.    . brimonidine (ALPHAGAN) 0.2 % ophthalmic  solution 3 (three) times daily.    . calcitRIOL (ROCALTROL) 0.25 MCG capsule Take 0.25 mcg by mouth daily.    . carbamazepine (TEGRETOL XR) 200 MG 12 hr tablet Take 200 mg by mouth 2 (two) times daily.    . cephALEXin (KEFLEX) 250 MG capsule Take 1 capsule (250 mg total) by mouth 2 (two) times daily. 8 capsule 0  . cholecalciferol (VITAMIN D) 1000 UNITS tablet Take 1,000 Units by mouth daily.    . cyanocobalamin (,VITAMIN B-12,) 1000 MCG/ML injection Inject 1,000 mcg into the muscle every 30 (thirty) days.    Marland Kitchen gabapentin (NEURONTIN) 100 MG capsule Take 100 mg by mouth at bedtime.    Marland Kitchen omeprazole (PRILOSEC) 20 MG capsule Take 20 mg by mouth daily.    . pantoprazole (PROTONIX) 40  MG tablet Take 1 tablet (40 mg total) by mouth daily. **PT NEEDS FOLLOW UP APPT** 90 tablet 0  . QUEtiapine (SEROQUEL) 25 MG tablet Take 50 mg by mouth at bedtime. She takes two 25mg  tabs at bedtime     . timolol (BETIMOL) 0.25 % ophthalmic solution 1-2 drops 2 (two) times daily.    . timolol (TIMOPTIC) 0.5 % ophthalmic solution Place 1 drop into both eyes 2 (two) times daily.    . verapamil (CALAN-SR) 240 MG CR tablet Take 240 mg by mouth at bedtime.      No current facility-administered medications on file prior to visit.     There are no Patient Instructions on file for this visit. No Follow-up on file.   [place] A Cullin Dishman, PA-C

## 2016-10-03 ENCOUNTER — Inpatient Hospital Stay
Admission: EM | Admit: 2016-10-03 | Discharge: 2016-10-06 | DRG: 699 | Disposition: A | Discharge status: Home / Self Care | Payer: Medicare HMO | Attending: Internal Medicine | Admitting: Internal Medicine

## 2016-10-03 ENCOUNTER — Encounter: Payer: Self-pay | Admitting: Emergency Medicine

## 2016-10-03 ENCOUNTER — Emergency Department: Payer: Medicare HMO

## 2016-10-03 DIAGNOSIS — N185 Chronic kidney disease, stage 5: Secondary | ICD-10-CM | POA: Diagnosis present

## 2016-10-03 DIAGNOSIS — Z8249 Family history of ischemic heart disease and other diseases of the circulatory system: Secondary | ICD-10-CM | POA: Diagnosis not present

## 2016-10-03 DIAGNOSIS — H409 Unspecified glaucoma: Secondary | ICD-10-CM | POA: Diagnosis present

## 2016-10-03 DIAGNOSIS — Z87442 Personal history of urinary calculi: Secondary | ICD-10-CM | POA: Diagnosis not present

## 2016-10-03 DIAGNOSIS — N281 Cyst of kidney, acquired: Secondary | ICD-10-CM

## 2016-10-03 DIAGNOSIS — I120 Hypertensive chronic kidney disease with stage 5 chronic kidney disease or end stage renal disease: Secondary | ICD-10-CM | POA: Diagnosis present

## 2016-10-03 DIAGNOSIS — R809 Proteinuria, unspecified: Secondary | ICD-10-CM | POA: Diagnosis present

## 2016-10-03 DIAGNOSIS — D631 Anemia in chronic kidney disease: Secondary | ICD-10-CM | POA: Diagnosis present

## 2016-10-03 DIAGNOSIS — Z9049 Acquired absence of other specified parts of digestive tract: Secondary | ICD-10-CM | POA: Diagnosis not present

## 2016-10-03 DIAGNOSIS — N184 Chronic kidney disease, stage 4 (severe): Secondary | ICD-10-CM | POA: Diagnosis not present

## 2016-10-03 DIAGNOSIS — Z79899 Other long term (current) drug therapy: Secondary | ICD-10-CM | POA: Diagnosis not present

## 2016-10-03 DIAGNOSIS — R310 Gross hematuria: Secondary | ICD-10-CM | POA: Diagnosis not present

## 2016-10-03 DIAGNOSIS — Z823 Family history of stroke: Secondary | ICD-10-CM

## 2016-10-03 DIAGNOSIS — N2581 Secondary hyperparathyroidism of renal origin: Secondary | ICD-10-CM | POA: Diagnosis present

## 2016-10-03 DIAGNOSIS — Z90710 Acquired absence of both cervix and uterus: Secondary | ICD-10-CM | POA: Diagnosis not present

## 2016-10-03 DIAGNOSIS — Q613 Polycystic kidney, unspecified: Secondary | ICD-10-CM

## 2016-10-03 DIAGNOSIS — N390 Urinary tract infection, site not specified: Secondary | ICD-10-CM | POA: Diagnosis not present

## 2016-10-03 DIAGNOSIS — F319 Bipolar disorder, unspecified: Secondary | ICD-10-CM | POA: Diagnosis present

## 2016-10-03 DIAGNOSIS — R319 Hematuria, unspecified: Secondary | ICD-10-CM | POA: Diagnosis not present

## 2016-10-03 DIAGNOSIS — R079 Chest pain, unspecified: Secondary | ICD-10-CM

## 2016-10-03 DIAGNOSIS — K219 Gastro-esophageal reflux disease without esophagitis: Secondary | ICD-10-CM | POA: Diagnosis present

## 2016-10-03 DIAGNOSIS — Z888 Allergy status to other drugs, medicaments and biological substances status: Secondary | ICD-10-CM

## 2016-10-03 DIAGNOSIS — Z86010 Personal history of colonic polyps: Secondary | ICD-10-CM

## 2016-10-03 DIAGNOSIS — I10 Essential (primary) hypertension: Secondary | ICD-10-CM | POA: Diagnosis not present

## 2016-10-03 DIAGNOSIS — Z6831 Body mass index (BMI) 31.0-31.9, adult: Secondary | ICD-10-CM | POA: Diagnosis not present

## 2016-10-03 DIAGNOSIS — Q6100 Congenital renal cyst, unspecified: Secondary | ICD-10-CM | POA: Diagnosis not present

## 2016-10-03 DIAGNOSIS — Z23 Encounter for immunization: Secondary | ICD-10-CM

## 2016-10-03 DIAGNOSIS — R109 Unspecified abdominal pain: Secondary | ICD-10-CM | POA: Diagnosis not present

## 2016-10-03 DIAGNOSIS — N2889 Other specified disorders of kidney and ureter: Secondary | ICD-10-CM | POA: Diagnosis not present

## 2016-10-03 LAB — BASIC METABOLIC PANEL
ANION GAP: 8 (ref 5–15)
BUN: 41 mg/dL — ABNORMAL HIGH (ref 6–20)
CALCIUM: 8.7 mg/dL — AB (ref 8.9–10.3)
CO2: 24 mmol/L (ref 22–32)
CREATININE: 3.27 mg/dL — AB (ref 0.44–1.00)
Chloride: 105 mmol/L (ref 101–111)
GFR calc non Af Amer: 13 mL/min — ABNORMAL LOW (ref >60)
GFR, EST AFRICAN AMERICAN: 16 mL/min — AB (ref >60)
Glucose, Bld: 114 mg/dL — ABNORMAL HIGH (ref 65–99)
Potassium: 3.8 mmol/L (ref 3.5–5.1)
SODIUM: 137 mmol/L (ref 135–145)

## 2016-10-03 LAB — URINALYSIS, COMPLETE (UACMP) WITH MICROSCOPIC
SPECIFIC GRAVITY, URINE: 1.01 (ref 1.005–1.030)
Squamous Epithelial / HPF: NONE SEEN

## 2016-10-03 LAB — TROPONIN I: Troponin I: <0.03 ng/mL (ref <0.03)

## 2016-10-03 LAB — CBC
HCT: 30.2 % — ABNORMAL LOW (ref 35.0–47.0)
Hemoglobin: 10.1 g/dL — ABNORMAL LOW (ref 12.0–16.0)
MCH: 30.5 pg (ref 26.0–34.0)
MCHC: 33.4 g/dL (ref 32.0–36.0)
MCV: 91.3 fL (ref 80.0–100.0)
Platelets: 311 10*3/uL (ref 150–440)
RBC: 3.31 MIL/uL — AB (ref 3.80–5.20)
RDW: 14.4 % (ref 11.5–14.5)
WBC: 11.1 10*3/uL — AB (ref 3.6–11.0)

## 2016-10-03 LAB — PROTIME-INR
INR: 1.08
PROTHROMBIN TIME: 14 s (ref 11.4–15.2)

## 2016-10-03 LAB — APTT: aPTT: 32 seconds (ref 24–36)

## 2016-10-03 LAB — HEMOGLOBIN
HEMOGLOBIN: 8.9 g/dL — AB (ref 12.0–16.0)
Hemoglobin: 9.9 g/dL — ABNORMAL LOW (ref 12.0–16.0)

## 2016-10-03 MED ORDER — ONDANSETRON HCL 4 MG/2ML IJ SOLN
4.0000 mg | Freq: Once | INTRAMUSCULAR | Status: AC
  Administered 2016-10-03: 4 mg via INTRAVENOUS
  Filled 2016-10-03: qty 2

## 2016-10-03 MED ORDER — LATANOPROST 0.005 % OP SOLN
1.0000 [drp] | Freq: Every day | OPHTHALMIC | Status: DC
  Administered 2016-10-03 – 2016-10-05 (×3): 1 [drp] via OPHTHALMIC
  Filled 2016-10-03: qty 2.5

## 2016-10-03 MED ORDER — ONDANSETRON HCL 4 MG/2ML IJ SOLN
INTRAMUSCULAR | Status: AC
  Filled 2016-10-03: qty 2

## 2016-10-03 MED ORDER — ONDANSETRON HCL 4 MG PO TABS: 4.0000 mg | ORAL_TABLET | Freq: Four times a day (QID) | ORAL | Status: DC | PRN

## 2016-10-03 MED ORDER — ACETAMINOPHEN 650 MG RE SUPP: 650.0000 mg | Freq: Four times a day (QID) | RECTAL | Status: DC | PRN

## 2016-10-03 MED ORDER — CARBAMAZEPINE ER 200 MG PO TB12
200.0000 mg | ORAL_TABLET | Freq: Two times a day (BID) | ORAL | Status: DC
  Administered 2016-10-03 – 2016-10-06 (×7): 200 mg via ORAL
  Filled 2016-10-03 (×7): qty 1

## 2016-10-03 MED ORDER — TIMOLOL MALEATE 0.5 % OP SOLN
1.0000 [drp] | Freq: Two times a day (BID) | OPHTHALMIC | Status: DC
  Administered 2016-10-03 – 2016-10-06 (×7): 1 [drp] via OPHTHALMIC
  Filled 2016-10-03: qty 5

## 2016-10-03 MED ORDER — ONDANSETRON HCL 4 MG/2ML IJ SOLN: 4.0000 mg | Freq: Four times a day (QID) | INTRAMUSCULAR | Status: DC | PRN

## 2016-10-03 MED ORDER — QUETIAPINE FUMARATE 25 MG PO TABS
50.0000 mg | ORAL_TABLET | Freq: Every day | ORAL | Status: DC
  Administered 2016-10-03 – 2016-10-05 (×3): 50 mg via ORAL
  Filled 2016-10-03 (×3): qty 2

## 2016-10-03 MED ORDER — SODIUM CHLORIDE 0.9% FLUSH
3.0000 mL | Freq: Two times a day (BID) | INTRAVENOUS | Status: DC
  Administered 2016-10-03 – 2016-10-06 (×6): 3 mL via INTRAVENOUS

## 2016-10-03 MED ORDER — PIPERACILLIN-TAZOBACTAM 3.375 G IVPB
3.3750 g | Freq: Two times a day (BID) | INTRAVENOUS | Status: DC
  Administered 2016-10-03 – 2016-10-05 (×5): 3.375 g via INTRAVENOUS
  Filled 2016-10-03 (×6): qty 50

## 2016-10-03 MED ORDER — MORPHINE SULFATE (PF) 4 MG/ML IV SOLN
4.0000 mg | Freq: Once | INTRAVENOUS | Status: AC
  Administered 2016-10-03: 4 mg via INTRAVENOUS
  Filled 2016-10-03: qty 1

## 2016-10-03 MED ORDER — ACETAMINOPHEN 325 MG PO TABS
650.0000 mg | ORAL_TABLET | Freq: Four times a day (QID) | ORAL | Status: DC | PRN
  Administered 2016-10-04 – 2016-10-06 (×3): 650 mg via ORAL
  Filled 2016-10-03 (×3): qty 2

## 2016-10-03 MED ORDER — VITAMIN D 1000 UNITS PO TABS
1000.0000 [IU] | ORAL_TABLET | Freq: Every day | ORAL | Status: DC
  Administered 2016-10-03 – 2016-10-06 (×4): 1000 [IU] via ORAL
  Filled 2016-10-03 (×4): qty 1

## 2016-10-03 MED ORDER — PNEUMOCOCCAL VAC POLYVALENT 25 MCG/0.5ML IJ INJ
0.5000 mL | INJECTION | INTRAMUSCULAR | Status: AC
  Administered 2016-10-05: 0.5 mL via INTRAMUSCULAR
  Filled 2016-10-03: qty 0.5

## 2016-10-03 MED ORDER — PANTOPRAZOLE SODIUM 40 MG PO TBEC
40.0000 mg | DELAYED_RELEASE_TABLET | Freq: Two times a day (BID) | ORAL | Status: DC
  Administered 2016-10-03 – 2016-10-06 (×6): 40 mg via ORAL
  Filled 2016-10-03 (×6): qty 1

## 2016-10-03 MED ORDER — VERAPAMIL HCL ER 180 MG PO TBCR
360.0000 mg | EXTENDED_RELEASE_TABLET | Freq: Every day | ORAL | Status: DC
Start: 2016-10-03 — End: 2016-10-06
  Administered 2016-10-03 – 2016-10-05 (×3): 360 mg via ORAL
  Filled 2016-10-03 (×3): qty 2

## 2016-10-03 MED ORDER — NITROGLYCERIN 0.4 MG SL SUBL
0.4000 mg | SUBLINGUAL_TABLET | SUBLINGUAL | Status: AC
  Administered 2016-10-03: 0.4 mg via SUBLINGUAL
  Filled 2016-10-03: qty 1

## 2016-10-03 MED ORDER — TRAMADOL HCL 50 MG PO TABS
50.0000 mg | ORAL_TABLET | Freq: Four times a day (QID) | ORAL | Status: DC | PRN
  Administered 2016-10-03 (×2): 50 mg via ORAL
  Filled 2016-10-03 (×2): qty 1

## 2016-10-03 MED ORDER — SODIUM CHLORIDE 0.9 % IV SOLN
INTRAVENOUS | Status: AC
  Administered 2016-10-03 – 2016-10-04 (×2): via INTRAVENOUS

## 2016-10-03 MED ORDER — ALUM & MAG HYDROXIDE-SIMETH 200-200-20 MG/5ML PO SUSP
30.0000 mL | Freq: Four times a day (QID) | ORAL | Status: DC | PRN
  Filled 2016-10-03: qty 30

## 2016-10-03 MED ORDER — SUCRALFATE 1 G PO TABS
1.0000 g | ORAL_TABLET | Freq: Four times a day (QID) | ORAL | Status: DC
  Administered 2016-10-03 – 2016-10-06 (×11): 1 g via ORAL
  Filled 2016-10-03 (×11): qty 1

## 2016-10-03 MED ORDER — GABAPENTIN 100 MG PO CAPS
100.0000 mg | ORAL_CAPSULE | Freq: Every day | ORAL | Status: DC
  Administered 2016-10-03 – 2016-10-05 (×3): 100 mg via ORAL
  Filled 2016-10-03 (×3): qty 1

## 2016-10-03 MED ORDER — CALCITRIOL 0.25 MCG PO CAPS
0.2500 ug | ORAL_CAPSULE | Freq: Every day | ORAL | Status: DC
  Administered 2016-10-03 – 2016-10-06 (×4): 0.25 ug via ORAL
  Filled 2016-10-03 (×4): qty 1

## 2016-10-03 NOTE — ED Notes (Signed)
Pt transported to [place] by Wendelyn Breslow, [place] tech

## 2016-10-03 NOTE — ED Triage Notes (Signed)
Pt reports blood in urine that started last night dark brown, now bright red blood when urinating every time.  Pt has CKD V and has fistula in place x 1 year. Has not started HD yet. Reports some dysuria, but c/o right flank pain.

## 2016-10-03 NOTE — ED Notes (Signed)
Pt refused Zofran ordered by MD, states that she reacts badly to medication and does not want to take anything else.

## 2016-10-03 NOTE — Progress Notes (Signed)
Pharmacy Antibiotic Note  Savana Spina is a 71 y.o. female admitted on 10/03/2016 with Intra-abdominal infectino .  Pharmacy has been consulted for Zosyn  dosing.  Plan: Will start patient on Zosyn 3.375 IV EI every 12 hours based on current CrCl <76ml/min.   Height: 5\' 3"  (160 cm) Weight: 176 lb (79.8 kg) IBW/kg (Calculated) : 52.4  Temp (24hrs), Avg:98.4 F (36.9 C), Min:98.3 F (36.8 C), Max:98.4 F (36.9 C)   Recent Labs Lab 10/03/16 0833  WBC 11.1*  CREATININE 3.27*    Estimated Creatinine Clearance: 16.3 mL/min (A) (by C-G formula based on SCr of 3.27 mg/dL (H)).    Allergies  Allergen Reactions  . Indomethacin Hives  . Pollen Extract Other (See Comments)    Sinus problems and HA    Antimicrobials this admission: 03/16 Zosyn >>   Dose adjustments this admission:  Microbiology results: 3/16 UCx: sent  Thank you for allowing pharmacy to be a part of this patient's care.  Pernell Dupre, PharmD, BCPS Clinical Pharmacist 10/03/2016 12:48 PM

## 2016-10-03 NOTE — ED Notes (Signed)
Pt returned to room from CT

## 2016-10-03 NOTE — ED Notes (Signed)
RN in room, Pt c/o nausea. Pt states that she does not want any more Morphine for pain.   Will inform Dr. Alfred Levins and see if there is anything else that patient can be given for nausea at this time.

## 2016-10-03 NOTE — ED Notes (Signed)
PT sees Dr. Holley Raring for nephrology

## 2016-10-03 NOTE — Consult Note (Signed)
Full note to follow.  Chart reviewed.  Recommend bed rest, serial hemoglobin and close monitoring to ensure that she does not have active on-going bleeding. I will see the patient this afternoon.

## 2016-10-03 NOTE — H&P (Addendum)
[facility] [place] NAME: [name]    MR#:  [id number]  DATE OF BIRTH:  12-18-1945  DATE OF ADMISSION:  10/03/2016  PRIMARY CARE PHYSICIAN: Cletis Athens, MD   REQUESTING/REFERRING PHYSICIAN: Dr. Rudene Re  CHIEF COMPLAINT:   Chief Complaint  Patient presents with  . Hematuria    HISTORY OF PRESENT ILLNESS:  Joyce [name]  is a 71 y.o. female with a known history of  Hypertension, diverticulosis, bipolar with depression presents to hospital secondary to hematuria and right flank pain. Patient was admitted here about 6 weeks ago for possible rupture of one of the cysts from polycystic kidney disease and also had urinary tract infection with Escherichia coli at the time. She was treated appropriately and her hematuria has resolved before discharge. She had a mammogram 2 days ago and she feels like the lady who did the mammogram might have made her twist lot to fit in the machine. After she went home from the mammogram she started having very sharp right flank pain radiating all the way to the groin. From yesterday she noticed that her urine was bloody. Since the flank pain was continuous and not improving with bloody urine she knew she might have ruptured a cyst and so presented to the emergency room. CT of the abdomen he reveals new hemorrhage or acute in the lower pole of the right kidney and possibly cyst rupture. Urine analysis is hard to interpret due to having a lot of red cells. Patient denies any fevers. Has significant nausea. She received a dose of morphine for her flank pain and started complaining of chest pain 30 minutes later. EKG without any acute ST-T changes. Troponin is pending at this time. Her chest pain is improved after she burped.  PAST MEDICAL HISTORY:   Past Medical History:  Diagnosis Date  . Anal fissure   . Bipolar affective disorder ([place])   . CKD (chronic kidney disease)    Dr Holley Raring [name] 4  . Colon  polyps   . Diverticulitis   . Diverticulitis   . Family history of adverse reaction to anesthesia    mom - PONV  . GERD (gastroesophageal reflux disease)   . Headache    migraines - none over 10 yrs  . Heart murmur   . History of hiatal hernia   . Hypertension   . Pancreatitis    Valproic acid  . Vertigo     PAST SURGICAL HISTORY:   Past Surgical History:  Procedure Laterality Date  . ABDOMINAL HYSTERECTOMY  1990 ?  . AV FISTULA PLACEMENT  4/30  . BREAST EXCISIONAL BIOPSY Left 1994   neg surgical bx  . CHOLECYSTECTOMY  2003  . COLONOSCOPY  2014   Dr. Jamal Collin  . COLONOSCOPY WITH PROPOFOL N/A 09/24/2015   Procedure: COLONOSCOPY WITH random colon byopies.;  Surgeon: Lucilla Lame, MD;  Location: [facility];  Service: Endoscopy;  Laterality: N/A;  . ESOPHAGOGASTRODUODENOSCOPY (EGD) WITH PROPOFOL N/A 09/24/2015   Procedure: ESOPHAGOGASTRODUODENOSCOPY (EGD) ;  Surgeon: Lucilla Lame, MD;  Location: [facility];  Service: Endoscopy;  Laterality: N/A;  . EYE SURGERY    . PERIPHERAL VASCULAR CATHETERIZATION N/A 05/29/2015   Procedure: A/V Shuntogram/Fistulagram;  Surgeon: Katha Cabal, MD;  Location: [facility];  Service: Cardiovascular;  Laterality: N/A;  . PERIPHERAL VASCULAR CATHETERIZATION N/A 05/29/2015   Procedure: A/V Shunt Intervention;  Surgeon: Katha Cabal, MD;  Location: [facility];  Service:  Cardiovascular;  Laterality: N/A;  . POLYPECTOMY  09/24/2015   Procedure: POLYPECTOMY INTESTINAL;  Surgeon: Lucilla Lame, MD;  Location: [facility];  Service: Endoscopy;;  cecal polyp ascending polyp    SOCIAL HISTORY:   Social History  Substance Use Topics  . Smoking status: Never Smoker  . Smokeless tobacco: Never Used  . Alcohol use No    FAMILY HISTORY:   Family History  Problem Relation Age of Onset  . Stroke Father   . Hypertension Father   . Breast cancer Other   . Colon cancer Neg Hx   . Liver disease Neg Hx      DRUG ALLERGIES:   Allergies  Allergen Reactions  . Indomethacin Hives  . Pollen Extract Other (See Comments)    Sinus problems and HA    REVIEW OF SYSTEMS:   Review of Systems  Constitutional: Positive for malaise/fatigue. Negative for chills, fever and weight loss.  HENT: Positive for hearing loss. Negative for ear discharge, ear pain and nosebleeds.   Eyes: Negative for blurred vision, double vision and photophobia.  Respiratory: Negative for cough, hemoptysis, shortness of breath and wheezing.   Cardiovascular: Positive for chest pain. Negative for palpitations, orthopnea and leg swelling.  Gastrointestinal: Positive for abdominal pain, heartburn and nausea. Negative for constipation, diarrhea, melena and vomiting.  Genitourinary: Positive for hematuria. Negative for dysuria.  Musculoskeletal: Negative for back pain, myalgias and neck pain.  Skin: Negative for rash.  Neurological: Negative for dizziness, sensory change, speech change, focal weakness and headaches.  Endo/Heme/Allergies: Does not bruise/bleed easily.  Psychiatric/Behavioral: Negative for depression.    MEDICATIONS AT HOME:   Prior to Admission medications   Medication Sig Start Date End Date Taking? Authorizing Provider  acetaminophen (TYLENOL) 325 MG tablet Take 325 mg by mouth every 8 (eight) hours as needed.   Yes Historical Provider, MD  bimatoprost (LUMIGAN) 0.01 % SOLN Place 1 drop into both eyes at bedtime.   Yes Historical Provider, MD  brimonidine (ALPHAGAN) 0.2 % ophthalmic solution Place 1 drop into both eyes 2 (two) times daily.    Yes Historical Provider, MD  calcitRIOL (ROCALTROL) 0.25 MCG capsule Take 0.25 mcg by mouth daily.   Yes Historical Provider, MD  carbamazepine (TEGRETOL XR) 200 MG 12 hr tablet Take 200 mg by mouth 2 (two) times daily.   Yes Historical Provider, MD  cholecalciferol (VITAMIN D) 1000 UNITS tablet Take 1,000 Units by mouth daily.   Yes Historical Provider, MD   cyanocobalamin (,VITAMIN B-12,) 1000 MCG/ML injection Inject 1,000 mcg into the muscle every 30 (thirty) days.   Yes Historical Provider, MD  dexlansoprazole (DEXILANT) 60 MG capsule Take 60 mg by mouth daily.   Yes Historical Provider, MD  omeprazole (PRILOSEC) 20 MG capsule Take 20 mg by mouth daily.   Yes Historical Provider, MD  QUEtiapine (SEROQUEL) 25 MG tablet Take 25 mg by mouth at bedtime. She takes two 25mg  tabs at bedtime    Yes Historical Provider, MD  sucralfate (CARAFATE) 1 g tablet Take 1 g by mouth 4 (four) times daily.   Yes Historical Provider, MD  timolol (TIMOPTIC) 0.5 % ophthalmic solution Place 1 drop into both eyes 2 (two) times daily.   Yes Historical Provider, MD  verapamil (CALAN-SR) 240 MG CR tablet Take 360 mg by mouth at bedtime.  04/19/13  Yes Historical Provider, MD  cephALEXin (KEFLEX) 250 MG capsule Take 1 capsule (250 mg total) by mouth 2 (two) times daily. Patient not taking: Reported on 10/03/2016  07/31/16   Epifanio Lesches, MD  gabapentin (NEURONTIN) 100 MG capsule Take 100 mg by mouth at bedtime.    Historical Provider, MD  pantoprazole (PROTONIX) 40 MG tablet Take 1 tablet (40 mg total) by mouth daily. **PT NEEDS FOLLOW UP APPT** Patient not taking: Reported on 10/03/2016 08/25/16   Lucilla Lame, MD  timolol (BETIMOL) 0.25 % ophthalmic solution 1-2 drops 2 (two) times daily.    Historical Provider, MD      VITAL SIGNS:  Blood pressure 130/79, pulse 70, temperature 98.4 F (36.9 C), temperature source Oral, resp. rate 15, height 5\' 3"  (1.6 m), weight 79.8 kg (176 lb), SpO2 98 %.  PHYSICAL EXAMINATION:   Physical Exam  GENERAL:  71 y.o.-year-old patient lying in the bed with no acute distress.  EYES: Pupils equal, round, reactive to light and accommodation. No scleral icterus. Extraocular muscles intact.  HEENT: Head atraumatic, normocephalic. Oropharynx and nasopharynx clear.  NECK:  Supple, no jugular venous distention. No thyroid enlargement, no  tenderness.  LUNGS: Normal breath sounds bilaterally, no wheezing, rales,rhonchi or crepitation. No use of accessory muscles of respiration.  CARDIOVASCULAR: S1, S2 normal. No rubs, or gallops. 3/6 systolic murmur is present ABDOMEN: Soft, right flank tenderness, nondistended. Bowel sounds present. No organomegaly or mass.  EXTREMITIES: No pedal edema, cyanosis, or clubbing. Left upper extremity AV fistula with good bruit NEUROLOGIC: Cranial nerves II through XII are intact. Muscle strength 5/5 in all extremities. Sensation intact. Gait not checked.  PSYCHIATRIC: The patient is alert and oriented x 3.  SKIN: No obvious rash, lesion, or ulcer.   LABORATORY PANEL:   CBC  Recent Labs Lab 10/03/16 0833  WBC 11.1*  HGB 10.1*  HCT 30.2*  PLT 311   ------------------------------------------------------------------------------------------------------------------  Chemistries   Recent Labs Lab 10/03/16 0833  NA 137  K 3.8  CL 105  CO2 24  GLUCOSE 114*  BUN 41*  CREATININE 3.27*  CALCIUM 8.7*   ------------------------------------------------------------------------------------------------------------------  Cardiac Enzymes No results for input(s): TROPONINI in the last 168 hours. ------------------------------------------------------------------------------------------------------------------  RADIOLOGY:  Ct Renal Stone Study  Result Date: 10/03/2016 CLINICAL DATA:  Right flank pain and hematuria. History of chronic kidney disease and polycystic kidney disease. EXAM: CT ABDOMEN AND PELVIS WITHOUT CONTRAST TECHNIQUE: Multidetector CT imaging of the abdomen and pelvis was performed following the standard protocol without IV contrast. COMPARISON:  07/28/2016 and multiple additional prior CT examinations. FINDINGS: Lower chest: No acute abnormality. Hepatobiliary: Unenhanced appearance of the liver is unremarkable. Stable dilatation of the common bile duct post cholecystectomy  measuring up to approximately 11 mm. No visualized calculi within the bile ducts by CT. Pancreas: Unremarkable. No pancreatic ductal dilatation or surrounding inflammatory changes. Spleen: Normal in size without focal abnormality. Adrenals/Urinary Tract: Bilateral polycystic kidney disease again noted. There is new hemorrhage within an lower pole right exophytic cyst measuring approximately 4 cm. There also is new perinephric stranding adjacent to this cyst and extending below the right kidney. Findings are suggestive of both acute hemorrhage into a pre-existing cyst as well as a component of cyst rupture. Previous hemorrhage into a posterior upper right renal cyst has resolved. Stable increased density in a small superior left renal cyst is consistent with prior hemorrhage. No hydronephrosis or renal calculi identified. No evidence of ureteral calculi. The bladder is decompressed. Adrenal glands are unremarkable. Stomach/Bowel: Bowel shows no evidence of obstruction or inflammation. No free air. No abnormal fluid collections. Vascular/Lymphatic: No enlarged lymph nodes identified. Reproductive: Status post hysterectomy. No adnexal masses. Other:  No abdominal wall hernia or abnormality. No abdominopelvic ascites. Musculoskeletal: No acute or significant osseous findings. IMPRESSION: Polycystic kidney disease with new acute hemorrhage into a 4 cm lower pole cyst of the right kidney. Adjacent perinephric stranding extending below the right kidney is also suggestive of a component of cyst rupture. No renal calculi or component of obstructive uropathy identified. Electronically Signed   By: Aletta Edouard M.D.   On: 10/03/2016 09:08    EKG:   Orders placed or performed during the hospital encounter of 01/05/16  . ED EKG  . ED EKG  . EKG    IMPRESSION AND PLAN:   Joyce [name]  is a 71 y.o. female with a known history of  Hypertension, diverticulosis, bipolar with depression presents to hospital secondary to  hematuria and right flank pain.  #1 Acute hemorrhage into right kidney- due to rupture of a cyst in the lower pole of right kidney based on CT abd - patient has polycystic kidney disease and prior h/o cyst rupture -gentle hydration, urology and nephrology consult - H&H q8h - Unable to interpret urine analysis due to being bloody. We'll start Zosyn and particularly to cover for intra-abdominal infection. -Last admission urine cultures grew Escherichia coli.  #2 hematuria-secondary to cyst rupture on the kidney. -urology consulted.  Check hemoglobin. Transfuse as indicated.  #3 chest pain-likely GI causes started after giving morphine. We'll hold off on giving any morphine. -GI cocktail and Protonix twice a day. Continue sucralfate that patient takes at home. -Monitor troponins. EKG with no changes. No prior cardiac history.  #4 glaucoma-continue all the eyedrops  #5 history of SVT-continue verapamil. Currently sinus rhythm and rate controlled  #6 CKD stage 4- follows with nephrology, has a dialysis access- AV fistula in place, not started on dialysis yet. Continues to make minimal urine  #6 DVT prophylaxis-Ted's and SCDs. Avoid heparin products due to active bleed   All the records are reviewed and case discussed with ED provider. Management plans discussed with the patient, family and they are in agreement.  CODE STATUS: Full Code  TOTAL TIME TAKING CARE OF THIS PATIENT: 50 minutes.    Gladstone Lighter M.D on 10/03/2016 at 10:24 AM  Between 7am to 6pm - Pager - [phone]  After 6pm go to [email or web address] - password [email or web address] [facility]  Office  [phone]  CC: Primary care physician; Cletis Athens, MD

## 2016-10-03 NOTE — Consult Note (Signed)
1:36 PM   Joyce [name] [facility] Jul 11, 1946 [id number]  Referring provider: Dr. Gladstone Lighter  Chief Complaint  Patient presents with  . Hematuria    HPI: The patient is a 71 year old female with a past medical history significant for polycystic kidney disease, rupture of renal cysts 1 resulting in hematuria, and CKD presented to the hospital with right flank pain and gross hematuria. Her right flank pain began approximately 2 days ago. She noted gross hematuria yesterday which is why she presented to the hospital. She notes that her blood in his urine is slightly improving. She says its was dark red clear color with no clots. Though again it is improving since admission. She underwent a CT scan without contrast due to her renal insufficiency which showed a right 4 cm ruptured hemorrhagic cyst. At this time she has no other complaints.   She has had this problem once before January 2018. At that time she had an Escherichia coli urinary tract infection. Her gross hematuria didn't subsequently stop with conservative management. She has had no other episodes of gross hematuria and flank pain.  Her urine culture is pending. Her creatinine actually is slightly better than baseline at 3.27. Her GFR is 16 which also slightly better than her baseline.    PMH: Past Medical History:  Diagnosis Date  . Anal fissure   . Bipolar affective disorder ([place])   . CKD (chronic kidney disease)    Dr Holley Raring [name] 4  . Colon polyps   . Diverticulitis   . Diverticulitis   . Family history of adverse reaction to anesthesia    mom - PONV  . GERD (gastroesophageal reflux disease)   . Headache    migraines - none over 10 yrs  . Heart murmur   . History of hiatal hernia   . Hypertension   . Pancreatitis    Valproic acid  . Vertigo     Surgical History: Past Surgical History:  Procedure Laterality Date  . ABDOMINAL HYSTERECTOMY  1990 ?  . AV FISTULA PLACEMENT  4/30  . BREAST EXCISIONAL BIOPSY  Left 1994   neg surgical bx  . CHOLECYSTECTOMY  2003  . COLONOSCOPY  2014   Dr. Jamal Collin  . COLONOSCOPY WITH PROPOFOL N/A 09/24/2015   Procedure: COLONOSCOPY WITH random colon byopies.;  Surgeon: Lucilla Lame, MD;  Location: [facility];  Service: Endoscopy;  Laterality: N/A;  . ESOPHAGOGASTRODUODENOSCOPY (EGD) WITH PROPOFOL N/A 09/24/2015   Procedure: ESOPHAGOGASTRODUODENOSCOPY (EGD) ;  Surgeon: Lucilla Lame, MD;  Location: [facility];  Service: Endoscopy;  Laterality: N/A;  . EYE SURGERY    . PERIPHERAL VASCULAR CATHETERIZATION N/A 05/29/2015   Procedure: A/V Shuntogram/Fistulagram;  Surgeon: Katha Cabal, MD;  Location: [facility];  Service: Cardiovascular;  Laterality: N/A;  . PERIPHERAL VASCULAR CATHETERIZATION N/A 05/29/2015   Procedure: A/V Shunt Intervention;  Surgeon: Katha Cabal, MD;  Location: [facility];  Service: Cardiovascular;  Laterality: N/A;  . POLYPECTOMY  09/24/2015   Procedure: POLYPECTOMY INTESTINAL;  Surgeon: Lucilla Lame, MD;  Location: [facility];  Service: Endoscopy;;  cecal polyp ascending polyp    Allergies:  Allergies  Allergen Reactions  . Indomethacin Hives  . Pollen Extract Other (See Comments)    Sinus problems and HA    Family History: Family History  Problem Relation Age of Onset  . Stroke Father   . Hypertension Father   . Breast cancer Other   . Colon cancer Neg Hx   .  Liver disease Neg Hx     Social History:  reports that she has never smoked. She has never used smokeless tobacco. She reports that she does not drink alcohol or use drugs.  ROS: 12 point ROS negative except for above                                        Physical Exam: BP (!) 145/62 (BP Location: Right Arm)   Pulse 95   Temp 98.3 F (36.8 C) (Oral)   Resp 12   Ht 5\' 3"  (1.6 m)   Wt 176 lb (79.8 kg)   SpO2 99%   BMI 31.18 kg/m   Constitutional:  Alert and oriented, No acute distress. HEENT: [place]  AT, moist mucus membranes.  Trachea midline, no masses. Cardiovascular: No clubbing, cyanosis, or edema. Respiratory: Normal respiratory effort, no increased work of breathing. GI: Abdomen is soft, nontender, nondistended, no abdominal masses GU: Right flank TTP. No hematoma or bruising on exam. No foley. Skin: No rashes, bruises or suspicious lesions. Lymph: No cervical or inguinal adenopathy. Neurologic: Grossly intact, no focal deficits, moving all 4 extremities. Psychiatric: Normal mood and affect.  Laboratory Data: Lab Results  Component Value Date   WBC 11.1 (H) 10/03/2016   HGB 9.9 (L) 10/03/2016   HCT 30.2 (L) 10/03/2016   MCV 91.3 10/03/2016   PLT 311 10/03/2016    Lab Results  Component Value Date   CREATININE 3.27 (H) 10/03/2016    No results found for: PSA  No results found for: TESTOSTERONE  No results found for: HGBA1C  Urinalysis    Component Value Date/Time   COLORURINE RED (A) 10/03/2016 0833   APPEARANCEUR CLOUDY (A) 10/03/2016 0833   APPEARANCEUR Clear 05/10/2014 1344   LABSPEC 1.010 10/03/2016 0833   LABSPEC 1.009 05/10/2014 1344   PHURINE  10/03/2016 0833    TEST NOT REPORTED DUE TO COLOR INTERFERENCE OF URINE PIGMENT   GLUCOSEU (A) 10/03/2016 0833    TEST NOT REPORTED DUE TO COLOR INTERFERENCE OF URINE PIGMENT   GLUCOSEU Negative 05/10/2014 1344   HGBUR (A) 10/03/2016 0833    TEST NOT REPORTED DUE TO COLOR INTERFERENCE OF URINE PIGMENT   BILIRUBINUR (A) 10/03/2016 0833    TEST NOT REPORTED DUE TO COLOR INTERFERENCE OF URINE PIGMENT   BILIRUBINUR Negative 05/10/2014 1344   KETONESUR (A) 10/03/2016 0833    TEST NOT REPORTED DUE TO COLOR INTERFERENCE OF URINE PIGMENT   PROTEINUR (A) 10/03/2016 0833    TEST NOT REPORTED DUE TO COLOR INTERFERENCE OF URINE PIGMENT   NITRITE (A) 10/03/2016 0833    TEST NOT REPORTED DUE TO COLOR INTERFERENCE OF URINE PIGMENT   LEUKOCYTESUR (A) 10/03/2016 0833    TEST NOT REPORTED DUE TO COLOR INTERFERENCE OF URINE  PIGMENT   LEUKOCYTESUR Negative 05/10/2014 1344    Pertinent Imaging: CLINICAL DATA:  Right flank pain and hematuria. History of chronic kidney disease and polycystic kidney disease.  EXAM: CT ABDOMEN AND PELVIS WITHOUT CONTRAST  TECHNIQUE: Multidetector CT imaging of the abdomen and pelvis was performed following the standard protocol without IV contrast.  COMPARISON:  07/28/2016 and multiple additional prior CT examinations.  FINDINGS: Lower chest: No acute abnormality.  Hepatobiliary: Unenhanced appearance of the liver is unremarkable. Stable dilatation of the common bile duct post cholecystectomy measuring up to approximately 11 mm. No visualized calculi within the bile ducts by CT.  Pancreas:  Unremarkable. No pancreatic ductal dilatation or surrounding inflammatory changes.  Spleen: Normal in size without focal abnormality.  Adrenals/Urinary Tract: Bilateral polycystic kidney disease again noted. There is new hemorrhage within an lower pole right exophytic cyst measuring approximately 4 cm. There also is new perinephric stranding adjacent to this cyst and extending below the right kidney. Findings are suggestive of both acute hemorrhage into a pre-existing cyst as well as a component of cyst rupture. Previous hemorrhage into a posterior upper right renal cyst has resolved. Stable increased density in a small superior left renal cyst is consistent with prior hemorrhage. No hydronephrosis or renal calculi identified. No evidence of ureteral calculi. The bladder is decompressed. Adrenal glands are unremarkable.  Stomach/Bowel: Bowel shows no evidence of obstruction or inflammation. No free air. No abnormal fluid collections.  Vascular/Lymphatic: No enlarged lymph nodes identified.  Reproductive: Status post hysterectomy. No adnexal masses.  Other: No abdominal wall hernia or abnormality. No abdominopelvic ascites.  Musculoskeletal: No acute or  significant osseous findings.  IMPRESSION: Polycystic kidney disease with new acute hemorrhage into a 4 cm lower pole cyst of the right kidney. Adjacent perinephric stranding extending below the right kidney is also suggestive of a component of cyst rupture. No renal calculi or component of obstructive uropathy identified.  Assessment & Plan:    1. Polycystic kidney disease 2. Gross hematuria 3. Ruptured right hemorrhagic cyst At this point, I recommend conservative management of this patient wih strict bed rest for at least 24 hours. Continue to check serial hemoglobins to ensure stability and rule out any active bleeding. It is reasonable to continue antibiotics pending urine culture results as the patient had a positive urine culture during her last episode of a ruptured hemorrhagic cyst. Urology will continue to follow the patient offer recommendations as indicated.  Nickie Retort, MD  [facility] [address] [place], [place] [address] [phone]

## 2016-10-03 NOTE — ED Notes (Signed)
Dr. Raechel Ache informed that pt is c/o nausea. Verbal order given for Zofran 4 mg.

## 2016-10-03 NOTE — ED Provider Notes (Signed)
[facility] Emergency Department Provider Note  ____________________________________________  Time seen: Approximately 8:32 AM  I have reviewed the triage vital signs and the nursing notes.   HISTORY  Chief Complaint Hematuria   HPI Joyce [name] is a 70 y.o. female with h/o CKD, hypertension, kidney stones, anemia who presents for evaluation of hematuria and right flank pain. Patient reports 2 days of sharp constant 8 out of 10 right flank pain radiating to her right lower quadrant. Yesterday she noticedblood in her urine. She had one episode of dysuria overnight. She endorses prior history of kidney stones requiring lithotripsy. She is not on any blood thinners. She hasn't taken anything at home for the pain. She denies fever or chills, chest pain or shortness of breath, dizziness.  Past Medical History:  Diagnosis Date  . Anal fissure   . Bipolar affective disorder ([place])   . CKD (chronic kidney disease)    Dr Holley Raring [name] 4  . Colon polyps   . Diverticulitis   . Diverticulitis   . Family history of adverse reaction to anesthesia    mom - PONV  . GERD (gastroesophageal reflux disease)   . Headache    migraines - none over 10 yrs  . Heart murmur   . History of hiatal hernia   . Hypertension   . Pancreatitis    Valproic acid  . Vertigo     Patient Active Problem List   Diagnosis Date Noted  . Ruptured cyst of kidney 10/03/2016  . HTN (hypertension) 10/01/2016  . Chronic kidney disease 10/01/2016  . Anemia associated with chronic renal failure 09/24/2016  . Acute kidney insufficiency 07/28/2016  . Noninfectious diarrhea   . Benign neoplasm of cecum   . Benign neoplasm of ascending colon   . Diarrhea   . Nausea   . Gastric polyp   . Chronic constipation 12/27/2014  . Nausea with vomiting 12/27/2014  . Left sided abdominal pain 12/27/2014  . Personal history of colonic polyps 05/10/2013    Past Surgical History:  Procedure  Laterality Date  . ABDOMINAL HYSTERECTOMY  1990 ?  . AV FISTULA PLACEMENT  4/30  . BREAST EXCISIONAL BIOPSY Left 1994   neg surgical bx  . CHOLECYSTECTOMY  2003  . COLONOSCOPY  2014   Dr. Jamal Collin  . COLONOSCOPY WITH PROPOFOL N/A 09/24/2015   Procedure: COLONOSCOPY WITH random colon byopies.;  Surgeon: Lucilla Lame, MD;  Location: [place];  Service: Endoscopy;  Laterality: N/A;  . ESOPHAGOGASTRODUODENOSCOPY (EGD) WITH PROPOFOL N/A 09/24/2015   Procedure: ESOPHAGOGASTRODUODENOSCOPY (EGD) ;  Surgeon: Lucilla Lame, MD;  Location: [place];  Service: Endoscopy;  Laterality: N/A;  . EYE SURGERY    . PERIPHERAL VASCULAR CATHETERIZATION N/A 05/29/2015   Procedure: A/V Shuntogram/Fistulagram;  Surgeon: Katha Cabal, MD;  Location: [place] [facility];  Service: Cardiovascular;  Laterality: N/A;  . PERIPHERAL VASCULAR CATHETERIZATION N/A 05/29/2015   Procedure: A/V Shunt Intervention;  Surgeon: Katha Cabal, MD;  Location: [place] [facility];  Service: Cardiovascular;  Laterality: N/A;  . POLYPECTOMY  09/24/2015   Procedure: POLYPECTOMY INTESTINAL;  Surgeon: Lucilla Lame, MD;  Location: [place];  Service: Endoscopy;;  cecal polyp ascending polyp    Prior to Admission medications   Medication Sig Start Date End Date Taking? Authorizing Provider  acetaminophen (TYLENOL) 325 MG tablet Take 325 mg by mouth every 8 (eight) hours as needed.   Yes Historical Provider, MD  bimatoprost (LUMIGAN) 0.01 % SOLN Place  1 drop into both eyes at bedtime.   Yes Historical Provider, MD  brimonidine (ALPHAGAN) 0.2 % ophthalmic solution Place 1 drop into both eyes 2 (two) times daily.    Yes Historical Provider, MD  calcitRIOL (ROCALTROL) 0.25 MCG capsule Take 0.25 mcg by mouth daily.   Yes Historical Provider, MD  carbamazepine (TEGRETOL XR) 200 MG 12 hr tablet Take 200 mg by mouth 2 (two) times daily.   Yes Historical Provider, MD  cholecalciferol (VITAMIN D) 1000 UNITS tablet  Take 1,000 Units by mouth daily.   Yes Historical Provider, MD  cyanocobalamin (,VITAMIN B-12,) 1000 MCG/ML injection Inject 1,000 mcg into the muscle every 30 (thirty) days.   Yes Historical Provider, MD  dexlansoprazole (DEXILANT) 60 MG capsule Take 60 mg by mouth daily.   Yes Historical Provider, MD  omeprazole (PRILOSEC) 20 MG capsule Take 20 mg by mouth daily.   Yes Historical Provider, MD  QUEtiapine (SEROQUEL) 25 MG tablet Take 25 mg by mouth at bedtime. She takes two 25mg  tabs at bedtime    Yes Historical Provider, MD  sucralfate (CARAFATE) 1 g tablet Take 1 g by mouth 4 (four) times daily.   Yes Historical Provider, MD  timolol (TIMOPTIC) 0.5 % ophthalmic solution Place 1 drop into both eyes 2 (two) times daily.   Yes Historical Provider, MD  verapamil (CALAN-SR) 240 MG CR tablet Take 360 mg by mouth at bedtime.  04/19/13  Yes Historical Provider, MD  cephALEXin (KEFLEX) 250 MG capsule Take 1 capsule (250 mg total) by mouth 2 (two) times daily. Patient not taking: Reported on 10/03/2016 07/31/16   Epifanio Lesches, MD  gabapentin (NEURONTIN) 100 MG capsule Take 100 mg by mouth at bedtime.    Historical Provider, MD  pantoprazole (PROTONIX) 40 MG tablet Take 1 tablet (40 mg total) by mouth daily. **PT NEEDS FOLLOW UP APPT** Patient not taking: Reported on 10/03/2016 08/25/16   Lucilla Lame, MD  timolol (BETIMOL) 0.25 % ophthalmic solution 1-2 drops 2 (two) times daily.    Historical Provider, MD    Allergies Indomethacin and Pollen extract  Family History  Problem Relation Age of Onset  . Stroke Father   . Hypertension Father   . Breast cancer Other   . Colon cancer Neg Hx   . Liver disease Neg Hx     Social History Social History  Substance Use Topics  . Smoking status: Never Smoker  . Smokeless tobacco: Never Used  . Alcohol use No    Review of Systems  Constitutional: Negative for fever. Eyes: Negative for visual changes. ENT: Negative for sore throat. Neck: No neck  pain  Cardiovascular: Negative for chest pain. Respiratory: Negative for shortness of breath. Gastrointestinal: Negative for abdominal pain, vomiting or diarrhea. Genitourinary: + dysuria, hematuria, R flank pain Musculoskeletal: Negative for back pain. Skin: Negative for rash. Neurological: Negative for headaches, weakness or numbness. Psych: No SI or HI  ____________________________________________   PHYSICAL EXAM:  VITAL SIGNS: ED Triage Vitals  Enc Vitals Group     BP --      Pulse Rate 10/03/16 0818 95     Resp 10/03/16 0818 18     Temp 10/03/16 0818 98.4 F (36.9 C)     Temp Source 10/03/16 0818 Oral     SpO2 10/03/16 0818 100 %     Weight 10/03/16 0818 176 lb (79.8 kg)     Height 10/03/16 0818 5\' 3"  (1.6 m)     Head Circumference --  Peak Flow --      Pain Score 10/03/16 0821 8     Pain Loc --      Pain Edu? --      Excl. in [place]? --     Constitutional: Alert and oriented. Well appearing and in no apparent distress. HEENT:      Head: Normocephalic and atraumatic.         Eyes: Conjunctivae are normal. Sclera is non-icteric. EOMI. PERRL      Mouth/Throat: Mucous membranes are moist.       Neck: Supple with no signs of meningismus. Cardiovascular: Regular rate and rhythm. No murmurs, gallops, or rubs. 2+ symmetrical distal pulses are present in all extremities. No JVD. Respiratory: Normal respiratory effort. Lungs are clear to auscultation bilaterally. No wheezes, crackles, or rhonchi.  Gastrointestinal: Soft, ttp over the R quadrants, and non distended with positive bowel sounds. No rebound or guarding. Genitourinary: R CVA tenderness. Musculoskeletal: Nontender with normal range of motion in all extremities. No edema, cyanosis, or erythema of extremities. Neurologic: Normal speech and language. Face is symmetric. Moving all extremities. No gross focal neurologic deficits are appreciated. Skin: Skin is warm, dry and intact. No rash noted. Psychiatric: Mood and  affect are normal. Speech and behavior are normal.  ____________________________________________   LABS (all labs ordered are listed, but only abnormal results are displayed)  Labs Reviewed  CBC - Abnormal; Notable for the following:       Result Value   WBC 11.1 (*)    RBC 3.31 (*)    Hemoglobin 10.1 (*)    HCT 30.2 (*)    All other components within normal limits  BASIC METABOLIC PANEL - Abnormal; Notable for the following:    Glucose, Bld 114 (*)    BUN 41 (*)    Creatinine, Ser 3.27 (*)    Calcium 8.7 (*)    GFR calc non Af Amer 13 (*)    GFR calc Af Amer 16 (*)    All other components within normal limits  URINALYSIS, COMPLETE (UACMP) WITH MICROSCOPIC - Abnormal; Notable for the following:    Color, Urine RED (*)    APPearance CLOUDY (*)    Glucose, UA   (*)    Value: TEST NOT REPORTED DUE TO COLOR INTERFERENCE OF URINE PIGMENT   Hgb urine dipstick   (*)    Value: TEST NOT REPORTED DUE TO COLOR INTERFERENCE OF URINE PIGMENT   Bilirubin Urine   (*)    Value: TEST NOT REPORTED DUE TO COLOR INTERFERENCE OF URINE PIGMENT   Ketones, ur   (*)    Value: TEST NOT REPORTED DUE TO COLOR INTERFERENCE OF URINE PIGMENT   Protein, ur   (*)    Value: TEST NOT REPORTED DUE TO COLOR INTERFERENCE OF URINE PIGMENT   Nitrite   (*)    Value: TEST NOT REPORTED DUE TO COLOR INTERFERENCE OF URINE PIGMENT   Leukocytes, UA   (*)    Value: TEST NOT REPORTED DUE TO COLOR INTERFERENCE OF URINE PIGMENT   Bacteria, UA FEW (*)    All other components within normal limits  HEMOGLOBIN - Abnormal; Notable for the following:    Hemoglobin 9.9 (*)    All other components within normal limits  URINE CULTURE  PROTIME-INR  APTT  TROPONIN I  HEMOGLOBIN   ____________________________________________  EKG  ED ECG REPORT I, Rudene Re, the attending physician, personally viewed and interpreted this ECG.   Normal sinus rhythm, rate of  76, normal intervals, normal axis, no ST elevations or  depressions. ____________________________________________  RADIOLOGY  CT renal: Polycystic kidney disease with new acute hemorrhage into a 4 cm lower pole cyst of the right kidney. Adjacent perinephric stranding extending below the right kidney is also suggestive of a component of cyst rupture. No renal calculi or component of obstructive uropathy identified. ____________________________________________   PROCEDURES  Procedure(s) performed: None Procedures Critical Care performed:  None ____________________________________________   INITIAL IMPRESSION / ASSESSMENT AND PLAN / ED COURSE   71 y.o. female with h/o CKD, hypertension, kidney stones, anemia who presents for evaluation of hematuria since last night in the setting of two days of right flank pain. Patient with gross hematuria, she has right flank tenderness, and mild tenderness in the right quadrants in her abdomen. She is otherwise well-appearing with normal vital signs. We'll send patient for CT renal protocol, we'll give her IV morphine and Zofran for symptoms, will check hemoglobin, kidney function, and coagulation factors.  Clinical Course as of Oct 03 1512  Fri Oct 03, 2016  0919 CT showing polycystic kidney disease with a hemorrhagic cyst. No evidence of kidney stone. UA is limited due to significant amount of blood with few bacteria is seen in with a history of dysuria and a white count of 11.1 we'll treat with ceftriaxone until culture is back. Discussed with Dr. Holley Raring who agrees with admission for trending hgb. Will discuss with Hospitalist   [CV]  [phone] Patient had an episode of severe chest tightness, became diaphoretic and nauseous. EKG was done which showed no ischemic changes. We'll cycle cardiac enzymes. Episode lasted a few minutes and resolved without intervention. Patient has no prior history of heart disease.  [CV]    Clinical Course User Index [CV] Rudene Re, MD    Pertinent labs & imaging results  that were available during my care of the patient were reviewed by me and considered in my medical decision making (see chart for details).    ____________________________________________   FINAL CLINICAL IMPRESSION(S) / ED DIAGNOSES  Final diagnoses:  Ruptured cyst of kidney  Gross hematuria  Urinary tract infection with hematuria, site unspecified  Chest pain, unspecified type      NEW MEDICATIONS STARTED DURING THIS VISIT:  Current Discharge Medication List       Note:  This document was prepared using Dragon voice recognition software and may include unintentional dictation errors.    Rudene Re, MD 10/03/16 [phone]

## 2016-10-03 NOTE — ED Notes (Signed)
Pt states that she is having right flank pain since Wednesday and blood in her urine since yesterday. Patient denies fever and chills. Patient states that pain is worse when she is walking. Pt rates pain 8/10. Pt has hx/o renal disease. Pt is not currently on dialysis.

## 2016-10-03 NOTE — ED Notes (Addendum)
RN informed by Tivis Ringer from registration that pt is yelling out stating that her chest is hurting. RN to bedside to find pt laying on her side c/o chest pain in the center of her chest, pt found to be diaphoretic as well. Dr. Alfred Levins called to bedside and EKG obtained.  Pt requesting something to drink, per Dr. Alfred Levins OK for pt to have ice chips

## 2016-10-04 DIAGNOSIS — R310 Gross hematuria: Secondary | ICD-10-CM

## 2016-10-04 DIAGNOSIS — N281 Cyst of kidney, acquired: Secondary | ICD-10-CM

## 2016-10-04 DIAGNOSIS — Q613 Polycystic kidney, unspecified: Secondary | ICD-10-CM

## 2016-10-04 LAB — BASIC METABOLIC PANEL
Anion gap: 6 (ref 5–15)
BUN: 44 mg/dL — ABNORMAL HIGH (ref 6–20)
CHLORIDE: 109 mmol/L (ref 101–111)
CO2: 22 mmol/L (ref 22–32)
CREATININE: 3.35 mg/dL — AB (ref 0.44–1.00)
Calcium: 8.1 mg/dL — ABNORMAL LOW (ref 8.9–10.3)
GFR calc non Af Amer: 13 mL/min — ABNORMAL LOW (ref >60)
GFR, EST AFRICAN AMERICAN: 15 mL/min — AB (ref >60)
Glucose, Bld: 105 mg/dL — ABNORMAL HIGH (ref 65–99)
POTASSIUM: 3.7 mmol/L (ref 3.5–5.1)
SODIUM: 137 mmol/L (ref 135–145)

## 2016-10-04 LAB — CBC
HCT: 24.3 % — ABNORMAL LOW (ref 35.0–47.0)
Hemoglobin: 8.4 g/dL — ABNORMAL LOW (ref 12.0–16.0)
MCH: 31.8 pg (ref 26.0–34.0)
MCHC: 34.5 g/dL (ref 32.0–36.0)
MCV: 92.2 fL (ref 80.0–100.0)
PLATELETS: 225 10*3/uL (ref 150–440)
RBC: 2.64 MIL/uL — AB (ref 3.80–5.20)
RDW: 14.5 % (ref 11.5–14.5)
WBC: 7.8 10*3/uL (ref 3.6–11.0)

## 2016-10-04 LAB — HEMOGLOBIN: Hemoglobin: 8.3 g/dL — ABNORMAL LOW (ref 12.0–16.0)

## 2016-10-04 NOTE — Progress Notes (Signed)
[place] [facility] NAME: Joyce [name]    MR#:  [id number]  DATE OF BIRTH:  Sep 20, 1945  SUBJECTIVE:  CHIEF COMPLAINT:   Chief Complaint  Patient presents with  . Hematuria   - flank pain is improving, hematuria still present but more dark urine now - Hb drop noted  REVIEW OF SYSTEMS:  Review of Systems  Constitutional: Positive for malaise/fatigue. Negative for chills and fever.  HENT: Negative for congestion, ear discharge, hearing loss and nosebleeds.   Eyes: Negative for blurred vision and double vision.  Respiratory: Negative for cough, shortness of breath and wheezing.   Cardiovascular: Negative for chest pain, palpitations and leg swelling.  Gastrointestinal: Positive for abdominal pain. Negative for constipation, diarrhea, nausea and vomiting.  Genitourinary: Positive for flank pain and hematuria. Negative for dysuria.  Musculoskeletal: Negative for myalgias.  Neurological: Negative for dizziness, sensory change, speech change, focal weakness, seizures and headaches.    DRUG ALLERGIES:   Allergies  Allergen Reactions  . Indomethacin Hives  . Pollen Extract Other (See Comments)    Sinus problems and HA    VITALS:  Blood pressure (!) 113/44, pulse 69, temperature 98.1 F (36.7 C), temperature source Oral, resp. rate 16, height 5\' 3"  (1.6 m), weight 79.8 kg (176 lb), SpO2 95 %.  PHYSICAL EXAMINATION:  Physical Exam  GENERAL:  71 y.o.-year-old patient lying in the bed with no acute distress.  EYES: Pupils equal, round, reactive to light and accommodation. No scleral icterus. Extraocular muscles intact.  HEENT: Head atraumatic, normocephalic. Oropharynx and nasopharynx clear.  NECK:  Supple, no jugular venous distention. No thyroid enlargement, no tenderness.  LUNGS: Normal breath sounds bilaterally, no wheezing, rales,rhonchi or crepitation. No use of accessory muscles of respiration.  CARDIOVASCULAR: S1, S2 normal. No  rubs, or gallops. 3/6 systolic murmur is present ABDOMEN: Soft, right flank tenderness, nondistended. Bowel sounds present. No organomegaly or mass.  EXTREMITIES: No pedal edema, cyanosis, or clubbing. Left upper extremity AV fistula with good bruit NEUROLOGIC: Cranial nerves II through XII are intact. Muscle strength 5/5 in all extremities. Sensation intact. Gait not checked.  PSYCHIATRIC: The patient is alert and oriented x 3.  SKIN: No obvious rash, lesion, or ulcer.    LABORATORY PANEL:   CBC  Recent Labs Lab 10/04/16 0342 10/04/16 1132  WBC 7.8  --   HGB 8.4* 8.3*  HCT 24.3*  --   PLT 225  --    ------------------------------------------------------------------------------------------------------------------  Chemistries   Recent Labs Lab 10/04/16 0342  NA 137  K 3.7  CL 109  CO2 22  GLUCOSE 105*  BUN 44*  CREATININE 3.35*  CALCIUM 8.1*   ------------------------------------------------------------------------------------------------------------------  Cardiac Enzymes  Recent Labs Lab 10/03/16 0946  TROPONINI <0.03   ------------------------------------------------------------------------------------------------------------------  RADIOLOGY:  Ct Renal Stone Study  Result Date: 10/03/2016 CLINICAL DATA:  Right flank pain and hematuria. History of chronic kidney disease and polycystic kidney disease. EXAM: CT ABDOMEN AND PELVIS WITHOUT CONTRAST TECHNIQUE: Multidetector CT imaging of the abdomen and pelvis was performed following the standard protocol without IV contrast. COMPARISON:  07/28/2016 and multiple additional prior CT examinations. FINDINGS: Lower chest: No acute abnormality. Hepatobiliary: Unenhanced appearance of the liver is unremarkable. Stable dilatation of the common bile duct post cholecystectomy measuring up to approximately 11 mm. No visualized calculi within the bile ducts by CT. Pancreas: Unremarkable. No pancreatic ductal dilatation or  surrounding inflammatory changes. Spleen: Normal in size without focal abnormality. Adrenals/Urinary Tract: Bilateral polycystic  kidney disease again noted. There is new hemorrhage within an lower pole right exophytic cyst measuring approximately 4 cm. There also is new perinephric stranding adjacent to this cyst and extending below the right kidney. Findings are suggestive of both acute hemorrhage into a pre-existing cyst as well as a component of cyst rupture. Previous hemorrhage into a posterior upper right renal cyst has resolved. Stable increased density in a small superior left renal cyst is consistent with prior hemorrhage. No hydronephrosis or renal calculi identified. No evidence of ureteral calculi. The bladder is decompressed. Adrenal glands are unremarkable. Stomach/Bowel: Bowel shows no evidence of obstruction or inflammation. No free air. No abnormal fluid collections. Vascular/Lymphatic: No enlarged lymph nodes identified. Reproductive: Status post hysterectomy. No adnexal masses. Other: No abdominal wall hernia or abnormality. No abdominopelvic ascites. Musculoskeletal: No acute or significant osseous findings. IMPRESSION: Polycystic kidney disease with new acute hemorrhage into a 4 cm lower pole cyst of the right kidney. Adjacent perinephric stranding extending below the right kidney is also suggestive of a component of cyst rupture. No renal calculi or component of obstructive uropathy identified. Electronically Signed   By: Aletta Edouard M.D.   On: 10/03/2016 09:08    EKG:   Orders placed or performed during the hospital encounter of 01/05/16  . ED EKG  . ED EKG  . EKG    ASSESSMENT AND PLAN:   Joyce [name]  is a 71 y.o. female with a known history of  Hypertension, diverticulosis, bipolar with depression presents to hospital secondary to hematuria and right flank pain.  #1 Acute hemorrhage into right kidney- due to rupture of a cyst in the lower pole of right kidney based on CT  abd - patient has polycystic kidney disease and prior h/o cyst rupture -gentle hydration, Appreciate urology and nephrology consult -Strict bedrest recommended at this time. Continue to monitor urine until hematuria clears. -drop in hemoglobin noted. Transfuse if patient is symptomatic or hypotensive or tachycardic or if hemoglobin is less than 7 - Unable to interpret urine analysis due to being bloody. We'll start Zosyn and particularly to cover for intra-abdominal infection. -Last admission urine cultures grew Escherichia coli.monitor urine cultures  #2 hematuria-secondary to cyst rupture on the kidney. -urology consult is appreciated. Transfuse as indicated.  #3 GERD-on PPI and sucralfate  #4 glaucoma-continue all the eyedrops  #5 history of SVT-continue verapamil. Currently sinus rhythm and rate controlled  #6 CKD stage 4- follows with nephrology, has a dialysis access- AV fistula in place, not started on dialysis yet. Continues to make minimal urine  #7 DVT prophylaxis-Ted's and SCDs. Avoid heparin products due to active bleed     All the records are reviewed and case discussed with Care Management/Social Workerr. Management plans discussed with the patient, family and they are in agreement.  CODE STATUS: Full Code  TOTAL TIME TAKING CARE OF THIS PATIENT: 38 minutes.   POSSIBLE D/C IN 1-2 DAYS, DEPENDING ON CLINICAL CONDITION.   Gladstone Lighter M.D on 10/04/2016 at 11:59 AM  Between 7am to 6pm - Pager - [phone]  After 6pm go to [email or web address] - password [email or web address] [facility]  Office  [phone]  CC: Primary care physician; Cletis Athens, MD

## 2016-10-04 NOTE — Progress Notes (Signed)
[facility]  ROUNDING NOTE   Subjective:  Patient well known to [place] as an outpatient. She has underlying polycystic kidney disease with advanced renal dysfunction. She now comes in with recurrent hematuria most likely from a cyst rupture. This was confirmed on CT scan of the abdomen and pelvis. Her renal function appears to be stable at the moment. Patient also started on antibiotic therapy.   Objective:  Vital signs in last 24 hours:  Temp:  [98.1 F (36.7 C)-98.6 F (37 C)] 98.1 F (36.7 C) (03/17 1300) Pulse Rate:  [63-88] 63 (03/17 1300) Resp:  [16-18] 16 (03/17 0453) BP: (112-144)/(44-56) 112/47 (03/17 1300) SpO2:  [95 %-96 %] 96 % (03/17 1300)  Weight change:  Filed Weights   10/03/16 0818  Weight: 79.8 kg (176 lb)    Intake/Output: I/O last 3 completed shifts: In: 450.5 [P.O.:240; I.V.:160.5; IV Piggyback:50] Out: 1750 [Urine:1750]   Intake/Output this shift:  Total I/O In: 480 [P.O.:480] Out: 800 [Urine:800]  Physical Exam: General: No acute distress  Head: Normocephalic, atraumatic. Moist oral mucosal membranes  Eyes: Anicteric  Neck: Supple, trachea midline  Lungs:  Clear to auscultation, normal effort  Heart: S1S2 no rubs  Abdomen:  Soft, mild right flank tenderness, BS present  Extremities: no peripheral edema.  Neurologic: Nonfocal, moving all four extremities  Skin: No lesions  Access: LUE AVF with good bruit and thrill    Basic Metabolic Panel:  Recent Labs Lab 10/03/16 0833 10/04/16 0342  NA 137 137  K 3.8 3.7  CL 105 109  CO2 24 22  GLUCOSE 114* 105*  BUN 41* 44*  CREATININE 3.27* 3.35*  CALCIUM 8.7* 8.1*    Liver Function Tests: No results for input(s): AST, ALT, ALKPHOS, BILITOT, PROT, ALBUMIN in the last 168 hours. No results for input(s): LIPASE, AMYLASE in the last 168 hours. No results for input(s): AMMONIA in the last 168 hours.  CBC:  Recent Labs Lab 10/03/16 0833 10/03/16 1222 10/03/16 1936  10/04/16 0342 10/04/16 1132  WBC 11.1*  --   --  7.8  --   HGB 10.1* 9.9* 8.9* 8.4* 8.3*  HCT 30.2*  --   --  24.3*  --   MCV 91.3  --   --  92.2  --   PLT 311  --   --  225  --     Cardiac Enzymes:  Recent Labs Lab 10/03/16 0946  TROPONINI <0.03    BNP: Invalid input(s): POCBNP  CBG: No results for input(s): GLUCAP in the last 168 hours.  Microbiology: Results for orders placed or performed during the hospital encounter of 10/03/16  Urine culture     Status: Abnormal (Preliminary result)   Collection Time: 10/03/16  8:33 AM  Result Value Ref Range Status   Specimen Description URINE, CLEAN CATCH  Final   Special Requests NONE  Final   Culture (A)  Final    >=100,000 COLONIES/mL ESCHERICHIA COLI SUSCEPTIBILITIES TO FOLLOW Performed at [facility], [address], [place], [place] [address]    Report Status PENDING  Incomplete    Coagulation Studies:  Recent Labs  10/03/16 0833  LABPROT 14.0  INR 1.08    Urinalysis:  Recent Labs  10/03/16 0833  COLORURINE RED*  LABSPEC 1.010  PHURINE TEST NOT REPORTED DUE TO COLOR INTERFERENCE OF URINE PIGMENT  GLUCOSEU TEST NOT REPORTED DUE TO COLOR INTERFERENCE OF URINE PIGMENT*  HGBUR TEST NOT REPORTED DUE TO COLOR INTERFERENCE OF URINE PIGMENT*  BILIRUBINUR  TEST NOT REPORTED DUE TO COLOR INTERFERENCE OF URINE PIGMENT*  KETONESUR TEST NOT REPORTED DUE TO COLOR INTERFERENCE OF URINE PIGMENT*  PROTEINUR TEST NOT REPORTED DUE TO COLOR INTERFERENCE OF URINE PIGMENT*  NITRITE TEST NOT REPORTED DUE TO COLOR INTERFERENCE OF URINE PIGMENT*  LEUKOCYTESUR TEST NOT REPORTED DUE TO COLOR INTERFERENCE OF URINE PIGMENT*      Imaging: Ct Renal Stone Study  Result Date: 10/03/2016 CLINICAL DATA:  Right flank pain and hematuria. History of chronic kidney disease and polycystic kidney disease. EXAM: CT ABDOMEN AND PELVIS WITHOUT CONTRAST TECHNIQUE: Multidetector CT imaging of the abdomen and pelvis was performed following  the standard protocol without IV contrast. COMPARISON:  07/28/2016 and multiple additional prior CT examinations. FINDINGS: Lower chest: No acute abnormality. Hepatobiliary: Unenhanced appearance of the liver is unremarkable. Stable dilatation of the common bile duct post cholecystectomy measuring up to approximately 11 mm. No visualized calculi within the bile ducts by CT. Pancreas: Unremarkable. No pancreatic ductal dilatation or surrounding inflammatory changes. Spleen: Normal in size without focal abnormality. Adrenals/Urinary Tract: Bilateral polycystic kidney disease again noted. There is new hemorrhage within an lower pole right exophytic cyst measuring approximately 4 cm. There also is new perinephric stranding adjacent to this cyst and extending below the right kidney. Findings are suggestive of both acute hemorrhage into a pre-existing cyst as well as a component of cyst rupture. Previous hemorrhage into a posterior upper right renal cyst has resolved. Stable increased density in a small superior left renal cyst is consistent with prior hemorrhage. No hydronephrosis or renal calculi identified. No evidence of ureteral calculi. The bladder is decompressed. Adrenal glands are unremarkable. Stomach/Bowel: Bowel shows no evidence of obstruction or inflammation. No free air. No abnormal fluid collections. Vascular/Lymphatic: No enlarged lymph nodes identified. Reproductive: Status post hysterectomy. No adnexal masses. Other: No abdominal wall hernia or abnormality. No abdominopelvic ascites. Musculoskeletal: No acute or significant osseous findings. IMPRESSION: Polycystic kidney disease with new acute hemorrhage into a 4 cm lower pole cyst of the right kidney. Adjacent perinephric stranding extending below the right kidney is also suggestive of a component of cyst rupture. No renal calculi or component of obstructive uropathy identified. Electronically Signed   By: Aletta Edouard M.D.   On: 10/03/2016 09:08      Medications:    . calcitRIOL  0.25 mcg Oral Daily  . carbamazepine  200 mg Oral BID  . cholecalciferol  1,000 Units Oral Daily  . gabapentin  100 mg Oral QHS  . latanoprost  1 drop Both Eyes QHS  . pantoprazole  40 mg Oral BID AC  . piperacillin-tazobactam (ZOSYN)  IV  3.375 g Intravenous Q12H  . pneumococcal 23 valent vaccine  0.5 mL Intramuscular Tomorrow-1000  . QUEtiapine  50 mg Oral QHS  . sodium chloride flush  3 mL Intravenous Q12H  . sucralfate  1 g Oral QID  . timolol  1 drop Both Eyes BID  . verapamil  360 mg Oral QHS   acetaminophen **OR** acetaminophen, alum & mag hydroxide-simeth, ondansetron **OR** ondansetron (ZOFRAN) IV, traMADol  Assessment/ Plan:  71 year old Caucasian female with past medical history of diverticulosis, hypertension, bipolar disorder, polycystic kidneys noted on CT scan who returns for followup of chronic kidney disease stage IV, polycystic kidney disease.  1. Chronic kidney disease stage 5/polycystic kidney disease/proteinuria/gross hematuria: Patient has had recurrent cyst rupture. Given the cyst rupture she is at high risk for urinary tract infection. Therefore agree with ceftriaxone. Continue IV fluid hydration. No acute indication  for dialysis at the moment however we will need to continue to monitor her status closely. She has a functional left upper extremity AV fistula in place. Appreciate urology input.   2. Hypertension. Blood pressure currently 112/47. Continue verapamil.   3. Secondary hyperparathyroidism. Continue the patient calcitriol 0.25 g by mouth daily. We have been monitoring bone marrow metabolism parameters as an outpatient.   4. Anemia chronic kidney disease.  Hemoglobin currently 8.3 and down slightly. Consider blood transfusion if hemoglobin continues to drift down. She has been referred to hematology as an outpatient for management of her anemia of chronic kidney disease. She is unsure as to whether she wants to proceed  with Epogen at the moment.   LOS: 1 Divya Munshi 3/17/20184:13 PM

## 2016-10-04 NOTE — Progress Notes (Signed)
Called Dr. Ara Kussmaul regarding patient's drop in Hgb from 9.9 to 8.9 within 7 hour period.  Doctor will continue to monitor patient and labs.  Phoebe Sharps N  10/04/2016   1:29 AM

## 2016-10-04 NOTE — Plan of Care (Signed)
Problem: Activity: Goal: Risk for activity intolerance will decrease Outcome: Not Progressing Patient is on bed rest.

## 2016-10-04 NOTE — Consult Note (Signed)
This patient is being followed by urology for hemorrhage into one of her renal cysts on the left side.  The patient was seen and evaluated just today with recommendation of bedrest and serial hemoglobin checks.  In the interval, the patient states that her pain has improved. She denies any fevers or chills. She denies any specific acute events or complaints.  No current facility-administered medications on file prior to encounter.    Current Outpatient Prescriptions on File Prior to Encounter  Medication Sig Dispense Refill  . acetaminophen (TYLENOL) 325 MG tablet Take 325 mg by mouth every 8 (eight) hours as needed.    . bimatoprost (LUMIGAN) 0.01 % SOLN Place 1 drop into both eyes at bedtime.    . brimonidine (ALPHAGAN) 0.2 % ophthalmic solution Place 1 drop into both eyes 2 (two) times daily.     . calcitRIOL (ROCALTROL) 0.25 MCG capsule Take 0.25 mcg by mouth daily.    . carbamazepine (TEGRETOL XR) 200 MG 12 hr tablet Take 200 mg by mouth 2 (two) times daily.    . cholecalciferol (VITAMIN D) 1000 UNITS tablet Take 1,000 Units by mouth daily.    . cyanocobalamin (,VITAMIN B-12,) 1000 MCG/ML injection Inject 1,000 mcg into the muscle every 30 (thirty) days.    Marland Kitchen omeprazole (PRILOSEC) 20 MG capsule Take 20 mg by mouth daily.    . QUEtiapine (SEROQUEL) 25 MG tablet Take 25 mg by mouth at bedtime. She takes two 25mg  tabs at bedtime     . timolol (TIMOPTIC) 0.5 % ophthalmic solution Place 1 drop into both eyes 2 (two) times daily.    . verapamil (CALAN-SR) 240 MG CR tablet Take 360 mg by mouth at bedtime.     . cephALEXin (KEFLEX) 250 MG capsule Take 1 capsule (250 mg total) by mouth 2 (two) times daily. (Patient not taking: Reported on 10/03/2016) 8 capsule 0  . gabapentin (NEURONTIN) 100 MG capsule Take 100 mg by mouth at bedtime.    . pantoprazole (PROTONIX) 40 MG tablet Take 1 tablet (40 mg total) by mouth daily. **PT NEEDS FOLLOW UP APPT** (Patient not taking: Reported on 10/03/2016) 90  tablet 0  . timolol (BETIMOL) 0.25 % ophthalmic solution 1-2 drops 2 (two) times daily.     Past Medical History:  Diagnosis Date  . Anal fissure   . Bipolar affective disorder ([place])   . CKD (chronic kidney disease)    Dr Holley Raring [name] 4  . Colon polyps   . Diverticulitis   . Diverticulitis   . Family history of adverse reaction to anesthesia    mom - PONV  . GERD (gastroesophageal reflux disease)   . Headache    migraines - none over 10 yrs  . Heart murmur   . History of hiatal hernia   . Hypertension   . Pancreatitis    Valproic acid  . Vertigo    Past Surgical History:  Procedure Laterality Date  . ABDOMINAL HYSTERECTOMY  1990 ?  . AV FISTULA PLACEMENT  4/30  . BREAST EXCISIONAL BIOPSY Left 1994   neg surgical bx  . CHOLECYSTECTOMY  2003  . COLONOSCOPY  2014   Dr. Jamal Collin  . COLONOSCOPY WITH PROPOFOL N/A 09/24/2015   Procedure: COLONOSCOPY WITH random colon byopies.;  Surgeon: Lucilla Lame, MD;  Location: [facility];  Service: Endoscopy;  Laterality: N/A;  . ESOPHAGOGASTRODUODENOSCOPY (EGD) WITH PROPOFOL N/A 09/24/2015   Procedure: ESOPHAGOGASTRODUODENOSCOPY (EGD) ;  Surgeon: Lucilla Lame, MD;  Location: [facility];  Service:  Endoscopy;  Laterality: N/A;  . EYE SURGERY    . PERIPHERAL VASCULAR CATHETERIZATION N/A 05/29/2015   Procedure: A/V Shuntogram/Fistulagram;  Surgeon: Katha Cabal, MD;  Location: [facility];  Service: Cardiovascular;  Laterality: N/A;  . PERIPHERAL VASCULAR CATHETERIZATION N/A 05/29/2015   Procedure: A/V Shunt Intervention;  Surgeon: Katha Cabal, MD;  Location: [facility];  Service: Cardiovascular;  Laterality: N/A;  . POLYPECTOMY  09/24/2015   Procedure: POLYPECTOMY INTESTINAL;  Surgeon: Lucilla Lame, MD;  Location: [facility];  Service: Endoscopy;;  cecal polyp ascending polyp   Vitals:   10/03/16 2047 10/04/16 0453  BP: (!) 144/56 (!) 113/44  Pulse: 88 69  Resp: 18 16  Temp: 98.6 F (37 C)  98.1 F (36.7 C)   PE: NAD [place] is soft, right CVA tenderness   Recent Labs  10/03/16 0833 10/03/16 1222 10/03/16 1936 10/04/16 0342  WBC 11.1*  --   --  7.8  HGB 10.1* 9.9* 8.9* 8.4*  HCT 30.2*  --   --  24.3*    Recent Labs  10/03/16 0833 10/04/16 0342  NA 137 137  K 3.8 3.7  CL 105 109  CO2 24 22  GLUCOSE 114* 105*  BUN 41* 44*  CREATININE 3.27* 3.35*  CALCIUM 8.7* 8.1*    Recent Labs  10/03/16 0833  INR 1.08   No results for input(s): PSA in the last 72 hours. No results for input(s): LABURIN in the last 72 hours. Results for orders placed or performed during the hospital encounter of 07/28/16  Urine culture     Status: Abnormal   Collection Time: 07/28/16  9:56 AM  Result Value Ref Range Status   Specimen Description URINE, RANDOM  Final   Special Requests NONE  Final   Culture >=100,000 COLONIES/mL ESCHERICHIA COLI (A)  Final   Report Status 07/31/2016 FINAL  Final   Organism ID, Bacteria ESCHERICHIA COLI (A)  Final      Susceptibility   Escherichia coli - MIC*    AMPICILLIN 8 SENSITIVE Sensitive     CEFAZOLIN <=4 SENSITIVE Sensitive     CEFTRIAXONE <=1 SENSITIVE Sensitive     CIPROFLOXACIN 1 SENSITIVE Sensitive     GENTAMICIN <=1 SENSITIVE Sensitive     IMIPENEM <=0.25 SENSITIVE Sensitive     NITROFURANTOIN <=16 SENSITIVE Sensitive     TRIMETH/SULFA >=320 RESISTANT Resistant     AMPICILLIN/SULBACTAM 4 SENSITIVE Sensitive     PIP/TAZO <=4 SENSITIVE Sensitive     Extended ESBL NEGATIVE Sensitive     * >=100,000 COLONIES/mL ESCHERICHIA COLI    Imaging: I reviewed the patient's CT scan which was performed on 10/03/16 demonstrating right lower pole hemorrhage within a posterior cyst. The patient has bilateral cystic kidneys consistent with polycystic kidney disease.  Impression: The patient's hemoglobin continues to drop. She continues to have hematuria. However, her pain is improving which is reassuring. Her vital signs also been  stable.  Recommendations: I recommended the patient continue with bed rest and serial hemoglobins, every 8 hours. Continue to monitor the patient's urine to ensure that the hematuria clears. Once her hemoglobin stabilizes and her urine is cleared she can be liberalized. The patient should be transfused if she becomes symptomatic with hypotension or tachycardia. We will continue to follow.

## 2016-10-05 LAB — URINALYSIS, COMPLETE (UACMP) WITH MICROSCOPIC
BILIRUBIN URINE: NEGATIVE
Glucose, UA: NEGATIVE mg/dL
Ketones, ur: NEGATIVE mg/dL
NITRITE: NEGATIVE
Protein, ur: 30 mg/dL — AB
SPECIFIC GRAVITY, URINE: 1.008 (ref 1.005–1.030)
pH: 5 (ref 5.0–8.0)

## 2016-10-05 LAB — BASIC METABOLIC PANEL
ANION GAP: 7 (ref 5–15)
BUN: 47 mg/dL — ABNORMAL HIGH (ref 6–20)
CALCIUM: 8.2 mg/dL — AB (ref 8.9–10.3)
CHLORIDE: 109 mmol/L (ref 101–111)
CO2: 23 mmol/L (ref 22–32)
Creatinine, Ser: 3.64 mg/dL — ABNORMAL HIGH (ref 0.44–1.00)
GFR calc non Af Amer: 12 mL/min — ABNORMAL LOW (ref >60)
GFR, EST AFRICAN AMERICAN: 14 mL/min — AB (ref >60)
Glucose, Bld: 96 mg/dL (ref 65–99)
POTASSIUM: 3.8 mmol/L (ref 3.5–5.1)
Sodium: 139 mmol/L (ref 135–145)

## 2016-10-05 LAB — URINE CULTURE

## 2016-10-05 LAB — CBC
HEMATOCRIT: 25.5 % — AB (ref 35.0–47.0)
HEMOGLOBIN: 8.4 g/dL — AB (ref 12.0–16.0)
MCH: 30.2 pg (ref 26.0–34.0)
MCHC: 33.2 g/dL (ref 32.0–36.0)
MCV: 91 fL (ref 80.0–100.0)
Platelets: 221 10*3/uL (ref 150–440)
RBC: 2.8 MIL/uL — AB (ref 3.80–5.20)
RDW: 14.4 % (ref 11.5–14.5)
WBC: 5.9 10*3/uL (ref 3.6–11.0)

## 2016-10-05 MED ORDER — CYANOCOBALAMIN 1000 MCG/ML IJ SOLN
1000.0000 ug | Freq: Once | INTRAMUSCULAR | Status: AC
  Administered 2016-10-05: 1000 ug via INTRAMUSCULAR
  Filled 2016-10-05: qty 1

## 2016-10-05 MED ORDER — HYDRALAZINE HCL 20 MG/ML IJ SOLN
5.0000 mg | Freq: Once | INTRAMUSCULAR | Status: AC
  Administered 2016-10-05: 5 mg via INTRAVENOUS
  Filled 2016-10-05: qty 1

## 2016-10-05 MED ORDER — DEXTROSE 5 % IV SOLN
1.0000 g | INTRAVENOUS | Status: DC
  Administered 2016-10-05: 1 g via INTRAVENOUS
  Filled 2016-10-05 (×2): qty 10

## 2016-10-05 NOTE — Final Consult Note (Signed)
Consultant Final Sign-Off Note    Assessment/Final recommendations  Joyce [name] is a 71 y.o. female followed by me for Rupture hemorrhagic renal cyst with associated gross hematuria.  She was placed on bedrest and her hemoglobin was trended. Ultimately, her hemoglobin settled out at 8.3. Her pain improved. Her urine has cleared. Her blood pressure is currently controlled. At this point, the patient appears to be stable enough for discharge. She should follow up with her nephrologist and primary care providers. We will follow-up with her on an as-needed basis.  Wound care (if applicable):    Diet at discharge: per primary team   Activity at discharge: per primary team   Follow-up appointment:   PRN  Pending results:  [facility]     Ordered   10/05/16 0720  Urinalysis, Complete w Microscopic  Once,   R     10/05/16 0719       Medication recommendations: none  Other recommendations:    Thank you for allowing [place] to participate in the care of your patient!  Please consult [place] again if you have further needs for your patient.  Ardis Hughs 10/05/2016 7:35 AM    Subjective     Objective  Vital signs in last 24 hours: Temp:  [97.9 F (36.6 C)-98.6 F (37 C)] 97.9 F (36.6 C) (03/18 0623) Pulse Rate:  [63-72] 64 (03/18 0623) Resp:  [16-20] 20 (03/18 0623) BP: (112-146)/(47-53) 124/50 (03/18 0623) SpO2:  [95 %-96 %] 95 % (03/18 7564)  General: NAD No left flank pain Abdomen is soft - urine is yellow   Pertinent labs and Studies:  Recent Labs  10/03/16 0833  10/04/16 0342 10/04/16 1132 10/05/16 0357  WBC 11.1*  --  7.8  --  5.9  HGB 10.1*  < > 8.4* 8.3* 8.4*  HCT 30.2*  --  24.3*  --  25.5*  < > = values in this interval not displayed. BMET  Recent Labs  10/04/16 0342 10/05/16 0357  NA 137 139  K 3.7 3.8  CL 109 109  CO2 22 23  GLUCOSE 105* 96  BUN 44* 47*  CREATININE 3.35* 3.64*  CALCIUM 8.1* 8.2*   No results for  input(s): LABURIN in the last 72 hours. Results for orders placed or performed during the hospital encounter of 10/03/16  Urine culture     Status: Abnormal   Collection Time: 10/03/16  8:33 AM  Result Value Ref Range Status   Specimen Description URINE, CLEAN CATCH  Final   Special Requests NONE  Final   Culture >=100,000 COLONIES/mL ESCHERICHIA COLI (A)  Final   Report Status 10/05/2016 FINAL  Final   Organism ID, Bacteria ESCHERICHIA COLI (A)  Final      Susceptibility   Escherichia coli - MIC*    AMPICILLIN 8 SENSITIVE Sensitive     CEFAZOLIN <=4 SENSITIVE Sensitive     CEFTRIAXONE <=1 SENSITIVE Sensitive     CIPROFLOXACIN 1 SENSITIVE Sensitive     GENTAMICIN <=1 SENSITIVE Sensitive     IMIPENEM <=0.25 SENSITIVE Sensitive     NITROFURANTOIN <=16 SENSITIVE Sensitive     TRIMETH/SULFA <=20 SENSITIVE Sensitive     AMPICILLIN/SULBACTAM 4 SENSITIVE Sensitive     PIP/TAZO <=4 SENSITIVE Sensitive     Extended ESBL NEGATIVE Sensitive     * >=100,000 COLONIES/mL ESCHERICHIA COLI    Imaging: No results found.

## 2016-10-05 NOTE — Progress Notes (Signed)
[facility] [place] NAME: Joyce [name]    MR#:  [id number]  DATE OF BIRTH:  1945/12/03  SUBJECTIVE:  CHIEF COMPLAINT:   Chief Complaint  Patient presents with  . Hematuria   - hb is dropped since admission, but stable - urine has cleared up. Flank pain is improving  REVIEW OF SYSTEMS:  Review of Systems  Constitutional: Negative for chills, fever and malaise/fatigue.  HENT: Negative for congestion, ear discharge, hearing loss and nosebleeds.   Eyes: Negative for blurred vision and double vision.  Respiratory: Negative for cough, shortness of breath and wheezing.   Cardiovascular: Negative for chest pain, palpitations and leg swelling.  Gastrointestinal: Positive for abdominal pain. Negative for constipation, diarrhea, nausea and vomiting.  Genitourinary: Positive for flank pain. Negative for dysuria and hematuria.  Musculoskeletal: Negative for myalgias.  Neurological: Negative for dizziness, sensory change, speech change, focal weakness, seizures and headaches.    DRUG ALLERGIES:   Allergies  Allergen Reactions  . Indomethacin Hives  . Pollen Extract Other (See Comments)    Sinus problems and HA    VITALS:  Blood pressure (!) 124/50, pulse 64, temperature 97.9 F (36.6 C), temperature source Oral, resp. rate 20, height 5\' 3"  (1.6 m), weight 79.8 kg (176 lb), SpO2 95 %.  PHYSICAL EXAMINATION:  Physical Exam  GENERAL:  71 y.o.-year-old patient lying in the bed with no acute distress.  EYES: Pupils equal, round, reactive to light and accommodation. No scleral icterus. Extraocular muscles intact.  HEENT: Head atraumatic, normocephalic. Oropharynx and nasopharynx clear.  NECK:  Supple, no jugular venous distention. No thyroid enlargement, no tenderness.  LUNGS: Normal breath sounds bilaterally, no wheezing, rales,rhonchi or crepitation. No use of accessory muscles of respiration.  CARDIOVASCULAR: S1, S2 normal. No rubs, or  gallops. 3/6 systolic murmur is present ABDOMEN: Soft, improved right flank tenderness, nondistended. Bowel sounds present. No organomegaly or mass.  EXTREMITIES: No pedal edema, cyanosis, or clubbing. Left upper extremity AV fistula with good bruit NEUROLOGIC: Cranial nerves II through XII are intact. Muscle strength 5/5 in all extremities. Sensation intact. Gait not checked.  PSYCHIATRIC: The patient is alert and oriented x 3.  SKIN: No obvious rash, lesion, or ulcer.    LABORATORY PANEL:   CBC  Recent Labs Lab 10/05/16 0357  WBC 5.9  HGB 8.4*  HCT 25.5*  PLT 221   ------------------------------------------------------------------------------------------------------------------  Chemistries   Recent Labs Lab 10/05/16 0357  NA 139  K 3.8  CL 109  CO2 23  GLUCOSE 96  BUN 47*  CREATININE 3.64*  CALCIUM 8.2*   ------------------------------------------------------------------------------------------------------------------  Cardiac Enzymes  Recent Labs Lab 10/03/16 0946  TROPONINI <0.03   ------------------------------------------------------------------------------------------------------------------  RADIOLOGY:  No results found.  EKG:   Orders placed or performed during the hospital encounter of 01/05/16  . ED EKG  . ED EKG  . EKG    ASSESSMENT AND PLAN:   Joyce [name]  is a 71 y.o. female with a known history of  Hypertension, diverticulosis, bipolar with depression presents to hospital secondary to hematuria and right flank pain.  #1 Acute hemorrhage into right kidney- due to rupture of a cyst in the lower pole of right kidney based on CT abd - patient has polycystic kidney disease and prior h/o cyst rupture -gentle hydration, Appreciate urology and nephrology consult -bedrest recommended at this time for 1 more day.  - hematuria has cleared -drop in hemoglobin noted but stable. Transfuse if patient is symptomatic  or hypotensive or tachycardic or  if hemoglobin is less than 7 - urine cultures growing pan sensitive E.coli- change ABX to rocephin  #2 hematuria-secondary to cyst rupture on the kidney. -urology consult is appreciated. Transfuse as indicated. Hb is stable  #3 GERD-on PPI and sucralfate  #4 glaucoma-continue all the eyedrops  #5 history of SVT-continue verapamil. Currently sinus rhythm and rate controlled  #6 CKD stage 4- follows with nephrology, has a dialysis access- AV fistula in place, not started on dialysis yet. Continues to make urine  #7 DVT prophylaxis-Ted's and SCDs. Avoid heparin products due to active bleed     All the records are reviewed and case discussed with Care Management/Social Workerr. Management plans discussed with the patient, family and they are in agreement.  CODE STATUS: Full Code  TOTAL TIME TAKING CARE OF THIS PATIENT: 38 minutes.   POSSIBLE D/C IN 1-2 DAYS, DEPENDING ON CLINICAL CONDITION.   Gladstone Lighter M.D on 10/05/2016 at 12:09 PM  Between 7am to 6pm - Pager - [phone]  After 6pm go to [email or web address] - password [email or web address]  Office  [phone]  CC: Primary care physician; Cletis Athens, MD

## 2016-10-05 NOTE — Progress Notes (Signed)
[facility]  ROUNDING NOTE   Subjective:  Patient improving. She states that her hematuria is clearing. Renal function relatively stable. Creatinine currently 3.6. Hemoglobin also stable at 8.4.   Objective:  Vital signs in last 24 hours:  Temp:  [97.8 F (36.6 C)-98.6 F (37 C)] 97.8 F (36.6 C) (03/18 1222) Pulse Rate:  [64-72] 66 (03/18 1222) Resp:  [16-20] 20 (03/18 0623) BP: (124-149)/(50-61) 149/61 (03/18 1222) SpO2:  [95 %-99 %] 99 % (03/18 1222)  Weight change:  Filed Weights   10/03/16 0818  Weight: 79.8 kg (176 lb)    Intake/Output: I/O last 3 completed shifts: In: 723 [P.O.:720; I.V.:3] Out: 3150 [Urine:3150]   Intake/Output this shift:  Total I/O In: 480 [P.O.:480] Out: 600 [Urine:600]  Physical Exam: General: No acute distress  Head: Normocephalic, atraumatic. Moist oral mucosal membranes  Eyes: Anicteric  Neck: Supple, trachea midline  Lungs:  Clear to auscultation, normal effort  Heart: S1S2 no rubs  Abdomen:  Soft, mild right flank tenderness, BS present  Extremities: no peripheral edema.  Neurologic: Nonfocal, moving all four extremities  Skin: No lesions  Access: LUE AVF with good bruit and thrill    Basic Metabolic Panel:  Recent Labs Lab 10/03/16 0833 10/04/16 0342 10/05/16 0357  NA 137 137 139  K 3.8 3.7 3.8  CL 105 109 109  CO2 24 22 23   GLUCOSE 114* 105* 96  BUN 41* 44* 47*  CREATININE 3.27* 3.35* 3.64*  CALCIUM 8.7* 8.1* 8.2*    Liver Function Tests: No results for input(s): AST, ALT, ALKPHOS, BILITOT, PROT, ALBUMIN in the last 168 hours. No results for input(s): LIPASE, AMYLASE in the last 168 hours. No results for input(s): AMMONIA in the last 168 hours.  CBC:  Recent Labs Lab 10/03/16 0833 10/03/16 1222 10/03/16 1936 10/04/16 0342 10/04/16 1132 10/05/16 0357  WBC 11.1*  --   --  7.8  --  5.9  HGB 10.1* 9.9* 8.9* 8.4* 8.3* 8.4*  HCT 30.2*  --   --  24.3*  --  25.5*  MCV 91.3  --   --  92.2   --  91.0  PLT 311  --   --  225  --  221    Cardiac Enzymes:  Recent Labs Lab 10/03/16 0946  TROPONINI <0.03    BNP: Invalid input(s): POCBNP  CBG: No results for input(s): GLUCAP in the last 168 hours.  Microbiology: Results for orders placed or performed during the hospital encounter of 10/03/16  Urine culture     Status: Abnormal   Collection Time: 10/03/16  8:33 AM  Result Value Ref Range Status   Specimen Description URINE, CLEAN CATCH  Final   Special Requests NONE  Final   Culture >=100,000 COLONIES/mL ESCHERICHIA COLI (A)  Final   Report Status 10/05/2016 FINAL  Final   Organism ID, Bacteria ESCHERICHIA COLI (A)  Final      Susceptibility   Escherichia coli - MIC*    AMPICILLIN 8 SENSITIVE Sensitive     CEFAZOLIN <=4 SENSITIVE Sensitive     CEFTRIAXONE <=1 SENSITIVE Sensitive     CIPROFLOXACIN 1 SENSITIVE Sensitive     GENTAMICIN <=1 SENSITIVE Sensitive     IMIPENEM <=0.25 SENSITIVE Sensitive     NITROFURANTOIN <=16 SENSITIVE Sensitive     TRIMETH/SULFA <=20 SENSITIVE Sensitive     AMPICILLIN/SULBACTAM 4 SENSITIVE Sensitive     PIP/TAZO <=4 SENSITIVE Sensitive     Extended ESBL NEGATIVE Sensitive     * >=  100,000 COLONIES/mL ESCHERICHIA COLI    Coagulation Studies:  Recent Labs  10/03/16 0833  LABPROT 14.0  INR 1.08    Urinalysis:  Recent Labs  10/03/16 0833 10/05/16 0928  COLORURINE RED* YELLOW*  LABSPEC 1.010 1.008  PHURINE TEST NOT REPORTED DUE TO COLOR INTERFERENCE OF URINE PIGMENT 5.0  GLUCOSEU TEST NOT REPORTED DUE TO COLOR INTERFERENCE OF URINE PIGMENT* NEGATIVE  HGBUR TEST NOT REPORTED DUE TO COLOR INTERFERENCE OF URINE PIGMENT* LARGE*  BILIRUBINUR TEST NOT REPORTED DUE TO COLOR INTERFERENCE OF URINE PIGMENT* NEGATIVE  KETONESUR TEST NOT REPORTED DUE TO COLOR INTERFERENCE OF URINE PIGMENT* NEGATIVE  PROTEINUR TEST NOT REPORTED DUE TO COLOR INTERFERENCE OF URINE PIGMENT* 30*  NITRITE TEST NOT REPORTED DUE TO COLOR INTERFERENCE OF URINE  PIGMENT* NEGATIVE  LEUKOCYTESUR TEST NOT REPORTED DUE TO COLOR INTERFERENCE OF URINE PIGMENT* LARGE*      Imaging: No results found.   Medications:    . calcitRIOL  0.25 mcg Oral Daily  . carbamazepine  200 mg Oral BID  . cefTRIAXone (ROCEPHIN) IVPB 1 gram/50 mL D5W  1 g Intravenous Q24H  . cholecalciferol  1,000 Units Oral Daily  . gabapentin  100 mg Oral QHS  . latanoprost  1 drop Both Eyes QHS  . pantoprazole  40 mg Oral BID AC  . QUEtiapine  50 mg Oral QHS  . sodium chloride flush  3 mL Intravenous Q12H  . sucralfate  1 g Oral QID  . timolol  1 drop Both Eyes BID  . verapamil  360 mg Oral QHS   acetaminophen **OR** acetaminophen, alum & mag hydroxide-simeth, ondansetron **OR** ondansetron (ZOFRAN) IV, traMADol  Assessment/ Plan:  71 year old Caucasian female with past medical history of diverticulosis, hypertension, bipolar disorder, polycystic kidneys noted on CT scan who returns for followup of chronic kidney disease stage IV, polycystic kidney disease.  1. Chronic kidney disease stage 5/polycystic kidney disease/proteinuria/gross hematuria: Patient has had recurrent cyst rupture. Given the cyst rupture she is at high risk for urinary tract infection.  -  Patient now off of IV fluid hydration. Renal function appears to be stable. Continue antibiotic therapy for the underlying UTI with ceftriaxone 1 g IV every 24 hours. Continue to monitor renal parameters closely. No urgent indication for dialysis at the moment.   2. Hypertension. Blood pressure currently 149/61. Continue verapamil.   3. Secondary hyperparathyroidism. We will continue to monitor bone mineral metabolism parameters as an outpatient. For now continue calcitriol.   4. Anemia chronic kidney disease.  Hemoglobin currently stable at 8.4. As an outpatient the patient has been referred over to hematology however she is still deciding as to whether she wants Epogen as an outpatient. No indication for transfusion at  the moment.   LOS: 2 Chidi Shirer 3/18/20182:49 PM

## 2016-10-05 NOTE — Progress Notes (Signed)
Pharmacy Antibiotic Note  Joyce [name] is a 71 y.o. female admitted on 10/03/2016 with UTI.  Pharmacy has been consulted for ceftriaxone dosing.  Plan: Ceftriaxone 1 gm IV Q24H  Height: 5\' 3"  (160 cm) Weight: 176 lb (79.8 kg) IBW/kg (Calculated) : 52.4  Temp (24hrs), Avg:98.1 F (36.7 C), Min:97.8 F (36.6 C), Max:98.6 F (37 C)   Recent Labs Lab 10/03/16 0833 10/04/16 0342 10/05/16 0357  WBC 11.1* 7.8 5.9  CREATININE 3.27* 3.35* 3.64*    Estimated Creatinine Clearance: 14.6 mL/min (A) (by C-G formula based on SCr of 3.64 mg/dL (H)).    Allergies  Allergen Reactions  . Indomethacin Hives  . Pollen Extract Other (See Comments)    Sinus problems and HA    Thank you for allowing pharmacy to be a part of this patient's care.  Laural Benes, Pharm.D., BCPS Clinical Pharmacist 10/05/2016 12:28 PM

## 2016-10-05 NOTE — Progress Notes (Signed)
Pt BP elevated 180/78,. Pt complaining of headache and blurred vision. MD notified and Hydralazine 5 mg ordered and admin.

## 2016-10-06 ENCOUNTER — Other Ambulatory Visit: Payer: Self-pay | Admitting: Gastroenterology

## 2016-10-06 DIAGNOSIS — Z23 Encounter for immunization: Secondary | ICD-10-CM | POA: Diagnosis not present

## 2016-10-06 LAB — CBC
HEMATOCRIT: 26.8 % — AB (ref 35.0–47.0)
HEMOGLOBIN: 9 g/dL — AB (ref 12.0–16.0)
MCH: 31 pg (ref 26.0–34.0)
MCHC: 33.7 g/dL (ref 32.0–36.0)
MCV: 92.2 fL (ref 80.0–100.0)
Platelets: 260 10*3/uL (ref 150–440)
RBC: 2.91 MIL/uL — ABNORMAL LOW (ref 3.80–5.20)
RDW: 14.4 % (ref 11.5–14.5)
WBC: 6.2 10*3/uL (ref 3.6–11.0)

## 2016-10-06 LAB — BASIC METABOLIC PANEL
ANION GAP: 7 (ref 5–15)
BUN: 43 mg/dL — ABNORMAL HIGH (ref 6–20)
CHLORIDE: 110 mmol/L (ref 101–111)
CO2: 23 mmol/L (ref 22–32)
Calcium: 8.9 mg/dL (ref 8.9–10.3)
Creatinine, Ser: 3.44 mg/dL — ABNORMAL HIGH (ref 0.44–1.00)
GFR calc non Af Amer: 13 mL/min — ABNORMAL LOW (ref >60)
GFR, EST AFRICAN AMERICAN: 15 mL/min — AB (ref >60)
Glucose, Bld: 105 mg/dL — ABNORMAL HIGH (ref 65–99)
POTASSIUM: 3.7 mmol/L (ref 3.5–5.1)
Sodium: 140 mmol/L (ref 135–145)

## 2016-10-06 MED ORDER — CEFDINIR 300 MG PO CAPS: 300.0000 mg | ORAL_CAPSULE | Freq: Every day | ORAL | 0 refills | Status: DC

## 2016-10-06 MED ORDER — QUETIAPINE FUMARATE 50 MG PO TABS: 50.0000 mg | ORAL_TABLET | Freq: Every day | ORAL | 2 refills | Status: DC

## 2016-10-06 MED ORDER — TRAMADOL HCL 50 MG PO TABS
50.0000 mg | ORAL_TABLET | Freq: Four times a day (QID) | ORAL | 0 refills | Status: DC | PRN
Start: 2016-10-06 — End: 2017-02-11

## 2016-10-06 NOTE — Progress Notes (Signed)
     Joyce [name] was admitted to the [facility] on 10/03/2016 for an acute medical condition and is being Discharged on  10/06/2016 . She will need another 7 days for recovery and so advised to stay away from work until then. So please excuse her from work for the above  Days. Should be able to return to work without any restrictions from 10/14/16.  Call Gladstone Lighter  MD, [facility] at  [phone] with questions.  Gladstone Lighter M.D on 10/06/2016,at 2:28 PM  [facility] [address] [place] [address]

## 2016-10-06 NOTE — Discharge Summary (Signed)
[place] [facility] [place] NAME: Joyce [name]    MR#:  [id number]  DATE OF BIRTH:  03-23-1946  DATE OF ADMISSION:  10/03/2016   ADMITTING PHYSICIAN: Joyce Lighter, MD  DATE OF DISCHARGE: 10/06/2016  3:00 PM  PRIMARY CARE PHYSICIAN: MASOUD,JAVED, MD   ADMISSION DIAGNOSIS:   Gross hematuria [R31.0] Ruptured cyst of kidney [Q61.00] Urinary tract infection with hematuria, site unspecified [N39.0, R31.9] Chest pain, unspecified type [R07.9]  DISCHARGE DIAGNOSIS:   Active Problems:   Ruptured cyst of kidney   SECONDARY DIAGNOSIS:   Past Medical History:  Diagnosis Date  . Anal fissure   . Bipolar affective disorder ([place])   . CKD (chronic kidney disease)    Dr Joyce [name] 4  . Colon polyps   . Diverticulitis   . Diverticulitis   . Family history of adverse reaction to anesthesia    mom - PONV  . GERD (gastroesophageal reflux disease)   . Headache    migraines - none over 10 yrs  . Heart murmur   . History of hiatal hernia   . Hypertension   . Pancreatitis    Valproic acid  . Vertigo     HOSPITAL COURSE:   Joyce [name] a 71 y.o. femalewith a known history of Hypertension, diverticulosis, bipolar with depression presents to hospital secondary to hematuria and right flank pain.  #1 Acute hemorrhage into right kidney- due to rupture of a cyst in the lower pole of right kidney based on CT abd - patient has polycystic kidney disease and prior h/o cyst rupture -Received gentle hydration and was on bed rest, Appreciate urology and nephrology consult - hematuria has cleared -drop in hemoglobin noted but stable. Did not need any transfusion this admission. Hemoglobin was at 9 at discharge. -- urine cultures growing pan sensitive E.coli-  -Received Rocephin in the hospital. Will be discharged on Omnicef  #2 hematuria-secondary to cyst rupture on the kidney. -urology consult is appreciated. Hematuria has cleared up.  Hemoglobin is stable and patient did not require any transfusion this admission  #3 GERD-on PPI and sucralfate  #4 glaucoma-continue all the eyedrops  #5 history of SVT-continue verapamil. Currently sinus rhythm and rate controlled  #6 CKD stage 4- follows with nephrology, has a dialysis access- AV fistula in place, not started on dialysis yet. Continues to make urine  Stable for discharge today  DISCHARGE CONDITIONS:   Guarded  CONSULTS OBTAINED:   Treatment Team:  Joyce Legato, MD  Urology consultation by Dr. Louis [name]  DRUG ALLERGIES:   Allergies  Allergen Reactions  . Indomethacin Hives  . Pollen Extract Other (See Comments)    Sinus problems and HA   DISCHARGE MEDICATIONS:   Allergies as of 10/06/2016      Reactions   Indomethacin Hives   Pollen Extract Other (See Comments)   Sinus problems and HA      Medication List    STOP taking these medications   cephALEXin 250 MG capsule Commonly known as:  KEFLEX   omeprazole 20 MG capsule Commonly known as:  PRILOSEC   pantoprazole 40 MG tablet Commonly known as:  PROTONIX     TAKE these medications   acetaminophen 325 MG tablet Commonly known as:  TYLENOL Take 325 mg by mouth every 8 (eight) hours as needed.   BETIMOL 0.25 % ophthalmic solution Generic drug:  timolol 1-2 drops 2 (two) times daily.   bimatoprost 0.01 % Soln Commonly known as:  LUMIGAN Place  1 drop into both eyes at bedtime.   brimonidine 0.2 % ophthalmic solution Commonly known as:  ALPHAGAN Place 1 drop into both eyes 2 (two) times daily.   calcitRIOL 0.25 MCG capsule Commonly known as:  ROCALTROL Take 0.25 mcg by mouth daily.   carbamazepine 200 MG 12 hr tablet Commonly known as:  TEGRETOL XR Take 200 mg by mouth 2 (two) times daily.   cefdinir 300 MG capsule Commonly known as:  OMNICEF Take 1 capsule (300 mg total) by mouth daily. X 5 more days   cholecalciferol 1000 units tablet Commonly known as:  VITAMIN D Take  1,000 Units by mouth daily.   cyanocobalamin 1000 MCG/ML injection Commonly known as:  (VITAMIN B-12) Inject 1,000 mcg into the muscle every 30 (thirty) days.   DEXILANT 60 MG capsule Generic drug:  dexlansoprazole Take 60 mg by mouth daily.   gabapentin 100 MG capsule Commonly known as:  NEURONTIN Take 100 mg by mouth at bedtime.   QUEtiapine 50 MG tablet Commonly known as:  SEROQUEL Take 1 tablet (50 mg total) by mouth at bedtime. She takes two 25mg  tabs at bedtime What changed:  medication strength  how much to take   sucralfate 1 g tablet Commonly known as:  CARAFATE Take 1 g by mouth 4 (four) times daily.   timolol 0.5 % ophthalmic solution Commonly known as:  TIMOPTIC Place 1 drop into both eyes 2 (two) times daily.   traMADol 50 MG tablet Commonly known as:  ULTRAM Take 1 tablet (50 mg total) by mouth every 6 (six) hours as needed for moderate pain or severe pain.   verapamil 240 MG CR tablet Commonly known as:  CALAN-SR Take 360 mg by mouth at bedtime.        DISCHARGE INSTRUCTIONS:   1. PCP f/u in 1-2 weeks 2. Nephrology f/u in 1 week  DIET:   Renal diet  ACTIVITY:   Activity as tolerated  OXYGEN:   Home Oxygen: No.  Oxygen Delivery: room air  DISCHARGE LOCATION:   home   If you experience worsening of your admission symptoms, develop shortness of breath, life threatening emergency, suicidal or homicidal thoughts you must seek medical attention immediately by calling 911 or calling your MD immediately  if symptoms less severe.  You Must read complete instructions/literature along with all the possible adverse reactions/side effects for all the Medicines you take and that have been prescribed to you. Take any new Medicines after you have completely understood and accpet all the possible adverse reactions/side effects.   Please note  You were cared for by a hospitalist during your hospital stay. If you have any questions about your  discharge medications or the care you received while you were in the hospital after you are discharged, you can call the unit and asked to speak with the hospitalist on call if the hospitalist that took care of you is not available. Once you are discharged, your primary care physician will handle any further medical issues. Please note that NO REFILLS for any discharge medications will be authorized once you are discharged, as it is imperative that you return to your primary care physician (or establish a relationship with a primary care physician if you do not have one) for your aftercare needs so that they can reassess your need for medications and monitor your lab values.    On the day of Discharge:  VITAL SIGNS:   Blood pressure (!) 128/56, pulse 70, temperature 97.8 F (36.6  C), temperature source Oral, resp. rate 20, height 5\' 3"  (1.6 m), weight 79.8 kg (176 lb), SpO2 99 %.  PHYSICAL EXAMINATION:    GENERAL: 71 y.o.-year-old patient lying in the bed with no acute distress.  EYES: Pupils equal, round, reactive to light and accommodation. No scleral icterus. Extraocular muscles intact.  HEENT: Head atraumatic, normocephalic. Oropharynx and nasopharynx clear.  NECK: Supple, no jugular venous distention. No thyroid enlargement, no tenderness.  LUNGS: Normal breath sounds bilaterally, no wheezing, rales,rhonchi or crepitation. No use of accessory muscles of respiration.  CARDIOVASCULAR: S1, S2 normal. No rubs, or gallops. 3/6 systolic murmur is present ABDOMEN: Soft, improved right flank tenderness, nondistended. Bowel sounds present. No organomegaly or mass.  EXTREMITIES: No pedal edema, cyanosis, or clubbing. Left upper extremity AV fistula with good bruit NEUROLOGIC: Cranial nerves II through XII are intact. Muscle strength 5/5 in all extremities. Sensation intact. Gait not checked.  PSYCHIATRIC: The patient is alert and oriented x 3.  SKIN: No obvious rash, lesion, or ulcer.   DATA  REVIEW:   CBC  Recent Labs Lab 10/06/16 0357  WBC 6.2  HGB 9.0*  HCT 26.8*  PLT 260    Chemistries   Recent Labs Lab 10/06/16 0357  NA 140  K 3.7  CL 110  CO2 23  GLUCOSE 105*  BUN 43*  CREATININE 3.44*  CALCIUM 8.9     Microbiology Results  Results for orders placed or performed during the hospital encounter of 10/03/16  Urine culture     Status: Abnormal   Collection Time: 10/03/16  8:33 AM  Result Value Ref Range Status   Specimen Description URINE, CLEAN CATCH  Final   Special Requests NONE  Final   Culture >=100,000 COLONIES/mL ESCHERICHIA COLI (A)  Final   Report Status 10/05/2016 FINAL  Final   Organism ID, Bacteria ESCHERICHIA COLI (A)  Final      Susceptibility   Escherichia coli - MIC*    AMPICILLIN 8 SENSITIVE Sensitive     CEFAZOLIN <=4 SENSITIVE Sensitive     CEFTRIAXONE <=1 SENSITIVE Sensitive     CIPROFLOXACIN 1 SENSITIVE Sensitive     GENTAMICIN <=1 SENSITIVE Sensitive     IMIPENEM <=0.25 SENSITIVE Sensitive     NITROFURANTOIN <=16 SENSITIVE Sensitive     TRIMETH/SULFA <=20 SENSITIVE Sensitive     AMPICILLIN/SULBACTAM 4 SENSITIVE Sensitive     PIP/TAZO <=4 SENSITIVE Sensitive     Extended ESBL NEGATIVE Sensitive     * >=100,000 COLONIES/mL ESCHERICHIA COLI    RADIOLOGY:  No results found.   Management plans discussed with the patient, family and they are in agreement.  CODE STATUS:     Code Status Orders        Start     Ordered   10/03/16 1144  Full code  Continuous     10/03/16 1143    Code Status History    Date Active Date Inactive Code Status Order ID Comments User Context   07/28/2016  2:47 PM 07/31/2016  4:04 PM DNR [id number]  Bettey Costa, MD ED    Advance Directive Documentation     Most Recent Value  Type of Advance Directive  Healthcare Power of [place], Living will  Pre-existing out of facility DNR order (yellow form or pink MOST form)  -  "MOST" Form in Place?  -      TOTAL TIME TAKING CARE OF THIS PATIENT:  38 minutes.    Joyce [name] M.D on 10/06/2016 at 3:40 PM  Between  7am to 6pm - Pager - [phone]  After 6pm go to [email or web address] - Proofreader  [facility]  Hospitalists  Office  [phone]  CC: Primary care physician; Cletis Athens, MD   Note: This dictation was prepared with Dragon dictation along with smaller phrase technology. Any transcriptional errors that result from this process are unintentional.

## 2016-10-06 NOTE — Progress Notes (Signed)
[facility]  ROUNDING NOTE   Subjective:  Examined earlier today. No complaints. Plan for discharge   Objective:  Vital signs in last 24 hours:  Temp:  [97.5 F (36.4 C)-98 F (36.7 C)] 97.8 F (36.6 C) (03/19 1348) Pulse Rate:  [64-85] 70 (03/19 1348) Resp:  [20] 20 (03/19 0412) BP: (128-180)/(56-78) 128/56 (03/19 0412) SpO2:  [97 %-99 %] 99 % (03/19 1348)  Weight change:  Filed Weights   10/03/16 0818  Weight: 79.8 kg (176 lb)    Intake/Output: I/O last 3 completed shifts: In: 1010 [P.O.:960; IV Piggyback:50] Out: 2750 [Urine:2750]   Intake/Output this shift:  Total I/O In: 480 [P.O.:480] Out: -   Physical Exam: General: No acute distress  Head: Normocephalic, atraumatic. Moist oral mucosal membranes  Eyes: Anicteric  Neck: Supple, trachea midline  Lungs:  Clear to auscultation, normal effort  Heart: S1S2 no rubs  Abdomen:  Soft, mild right flank tenderness, BS present  Extremities: no peripheral edema.  Neurologic: Nonfocal, moving all four extremities  Skin: No lesions  Access: LUE AVF with good bruit and thrill    Basic Metabolic Panel:  Recent Labs Lab 10/03/16 0833 10/04/16 0342 10/05/16 0357 10/06/16 0357  NA 137 137 139 140  K 3.8 3.7 3.8 3.7  CL 105 109 109 110  CO2 24 22 23 23   GLUCOSE 114* 105* 96 105*  BUN 41* 44* 47* 43*  CREATININE 3.27* 3.35* 3.64* 3.44*  CALCIUM 8.7* 8.1* 8.2* 8.9    Liver Function Tests: No results for input(s): AST, ALT, ALKPHOS, BILITOT, PROT, ALBUMIN in the last 168 hours. No results for input(s): LIPASE, AMYLASE in the last 168 hours. No results for input(s): AMMONIA in the last 168 hours.  CBC:  Recent Labs Lab 10/03/16 0833  10/03/16 1936 10/04/16 0342 10/04/16 1132 10/05/16 0357 10/06/16 0357  WBC 11.1*  --   --  7.8  --  5.9 6.2  HGB 10.1*  < > 8.9* 8.4* 8.3* 8.4* 9.0*  HCT 30.2*  --   --  24.3*  --  25.5* 26.8*  MCV 91.3  --   --  92.2  --  91.0 92.2  PLT 311  --   --  225   --  221 260  < > = values in this interval not displayed.  Cardiac Enzymes:  Recent Labs Lab 10/03/16 0946  TROPONINI <0.03    BNP: Invalid input(s): POCBNP  CBG: No results for input(s): GLUCAP in the last 168 hours.  Microbiology: Results for orders placed or performed during the hospital encounter of 10/03/16  Urine culture     Status: Abnormal   Collection Time: 10/03/16  8:33 AM  Result Value Ref Range Status   Specimen Description URINE, CLEAN CATCH  Final   Special Requests NONE  Final   Culture >=100,000 COLONIES/mL ESCHERICHIA COLI (A)  Final   Report Status 10/05/2016 FINAL  Final   Organism ID, Bacteria ESCHERICHIA COLI (A)  Final      Susceptibility   Escherichia coli - MIC*    AMPICILLIN 8 SENSITIVE Sensitive     CEFAZOLIN <=4 SENSITIVE Sensitive     CEFTRIAXONE <=1 SENSITIVE Sensitive     CIPROFLOXACIN 1 SENSITIVE Sensitive     GENTAMICIN <=1 SENSITIVE Sensitive     IMIPENEM <=0.25 SENSITIVE Sensitive     NITROFURANTOIN <=16 SENSITIVE Sensitive     TRIMETH/SULFA <=20 SENSITIVE Sensitive     AMPICILLIN/SULBACTAM 4 SENSITIVE Sensitive     PIP/TAZO <=4 SENSITIVE  Sensitive     Extended ESBL NEGATIVE Sensitive     * >=100,000 COLONIES/mL ESCHERICHIA COLI    Coagulation Studies: No results for input(s): LABPROT, INR in the last 72 hours.  Urinalysis:  Recent Labs  10/05/16 0928  COLORURINE YELLOW*  LABSPEC 1.008  PHURINE 5.0  GLUCOSEU NEGATIVE  HGBUR LARGE*  BILIRUBINUR NEGATIVE  KETONESUR NEGATIVE  PROTEINUR 30*  NITRITE NEGATIVE  LEUKOCYTESUR LARGE*      Imaging: No results found.   Medications:    . calcitRIOL  0.25 mcg Oral Daily  . carbamazepine  200 mg Oral BID  . cefTRIAXone (ROCEPHIN) IVPB 1 gram/50 mL D5W  1 g Intravenous Q24H  . cholecalciferol  1,000 Units Oral Daily  . gabapentin  100 mg Oral QHS  . latanoprost  1 drop Both Eyes QHS  . pantoprazole  40 mg Oral BID AC  . QUEtiapine  50 mg Oral QHS  . sodium chloride  flush  3 mL Intravenous Q12H  . sucralfate  1 g Oral QID  . timolol  1 drop Both Eyes BID  . verapamil  360 mg Oral QHS   acetaminophen **OR** acetaminophen, alum & mag hydroxide-simeth, ondansetron **OR** ondansetron (ZOFRAN) IV, traMADol  Assessment/ Plan:  71 year old Caucasian female with past medical history of diverticulosis, hypertension, bipolar disorder, polycystic kidneys noted on CT scan who returns for followup of chronic kidney disease stage IV, polycystic kidney disease.  1. Chronic kidney disease stage 5 secondary to polycystic kidney with proteinuria and gross hematuria: Patient has had recurrent cyst rupture. Given the cyst rupture she is at high risk for urinary tract infection.    2. Hypertension. Blood pressure currently at goal - Continue verapamil.   3. Secondary hyperparathyroidism. We will continue to monitor bone mineral metabolism parameters as an outpatient. For now continue calcitriol.   4. Anemia chronic kidney disease.  Hemoglobin currently stable   LOS: [place], [place] 3/19/20184:02 PM

## 2016-10-08 ENCOUNTER — Other Ambulatory Visit: Payer: Commercial Managed Care - HMO

## 2016-10-08 ENCOUNTER — Ambulatory Visit: Payer: Commercial Managed Care - HMO

## 2016-10-09 ENCOUNTER — Emergency Department: Payer: Medicare HMO

## 2016-10-09 ENCOUNTER — Encounter: Payer: Self-pay | Admitting: Intensive Care

## 2016-10-09 ENCOUNTER — Emergency Department
Admission: EM | Admit: 2016-10-09 | Discharge: 2016-10-09 | Disposition: A | Discharge status: Home / Self Care | Payer: Medicare HMO | Source: Home / Self Care | Attending: Emergency Medicine | Admitting: Emergency Medicine

## 2016-10-09 DIAGNOSIS — N184 Chronic kidney disease, stage 4 (severe): Secondary | ICD-10-CM | POA: Insufficient documentation

## 2016-10-09 DIAGNOSIS — R319 Hematuria, unspecified: Secondary | ICD-10-CM | POA: Diagnosis not present

## 2016-10-09 DIAGNOSIS — Z8249 Family history of ischemic heart disease and other diseases of the circulatory system: Secondary | ICD-10-CM | POA: Diagnosis not present

## 2016-10-09 DIAGNOSIS — I120 Hypertensive chronic kidney disease with stage 5 chronic kidney disease or end stage renal disease: Secondary | ICD-10-CM | POA: Diagnosis not present

## 2016-10-09 DIAGNOSIS — J449 Chronic obstructive pulmonary disease, unspecified: Secondary | ICD-10-CM | POA: Diagnosis not present

## 2016-10-09 DIAGNOSIS — N281 Cyst of kidney, acquired: Secondary | ICD-10-CM | POA: Insufficient documentation

## 2016-10-09 DIAGNOSIS — N2889 Other specified disorders of kidney and ureter: Secondary | ICD-10-CM | POA: Diagnosis not present

## 2016-10-09 DIAGNOSIS — H409 Unspecified glaucoma: Secondary | ICD-10-CM | POA: Diagnosis present

## 2016-10-09 DIAGNOSIS — Z9049 Acquired absence of other specified parts of digestive tract: Secondary | ICD-10-CM | POA: Diagnosis not present

## 2016-10-09 DIAGNOSIS — I10 Essential (primary) hypertension: Secondary | ICD-10-CM | POA: Diagnosis not present

## 2016-10-09 DIAGNOSIS — R310 Gross hematuria: Secondary | ICD-10-CM | POA: Diagnosis not present

## 2016-10-09 DIAGNOSIS — I129 Hypertensive chronic kidney disease with stage 1 through stage 4 chronic kidney disease, or unspecified chronic kidney disease: Secondary | ICD-10-CM | POA: Insufficient documentation

## 2016-10-09 DIAGNOSIS — Z79899 Other long term (current) drug therapy: Secondary | ICD-10-CM

## 2016-10-09 DIAGNOSIS — K219 Gastro-esophageal reflux disease without esophagitis: Secondary | ICD-10-CM | POA: Diagnosis present

## 2016-10-09 DIAGNOSIS — N189 Chronic kidney disease, unspecified: Secondary | ICD-10-CM | POA: Diagnosis not present

## 2016-10-09 DIAGNOSIS — Z823 Family history of stroke: Secondary | ICD-10-CM | POA: Diagnosis not present

## 2016-10-09 DIAGNOSIS — N3001 Acute cystitis with hematuria: Secondary | ICD-10-CM

## 2016-10-09 DIAGNOSIS — Z803 Family history of malignant neoplasm of breast: Secondary | ICD-10-CM | POA: Diagnosis not present

## 2016-10-09 DIAGNOSIS — D631 Anemia in chronic kidney disease: Secondary | ICD-10-CM | POA: Diagnosis not present

## 2016-10-09 DIAGNOSIS — R112 Nausea with vomiting, unspecified: Secondary | ICD-10-CM | POA: Diagnosis not present

## 2016-10-09 DIAGNOSIS — I471 Supraventricular tachycardia: Secondary | ICD-10-CM | POA: Diagnosis not present

## 2016-10-09 DIAGNOSIS — N390 Urinary tract infection, site not specified: Secondary | ICD-10-CM | POA: Diagnosis not present

## 2016-10-09 DIAGNOSIS — Z90710 Acquired absence of both cervix and uterus: Secondary | ICD-10-CM | POA: Diagnosis not present

## 2016-10-09 DIAGNOSIS — Q6100 Congenital renal cyst, unspecified: Secondary | ICD-10-CM | POA: Diagnosis not present

## 2016-10-09 DIAGNOSIS — N2581 Secondary hyperparathyroidism of renal origin: Secondary | ICD-10-CM | POA: Diagnosis not present

## 2016-10-09 DIAGNOSIS — N185 Chronic kidney disease, stage 5: Secondary | ICD-10-CM | POA: Diagnosis not present

## 2016-10-09 DIAGNOSIS — K5790 Diverticulosis of intestine, part unspecified, without perforation or abscess without bleeding: Secondary | ICD-10-CM | POA: Diagnosis present

## 2016-10-09 DIAGNOSIS — Q613 Polycystic kidney, unspecified: Secondary | ICD-10-CM | POA: Diagnosis not present

## 2016-10-09 DIAGNOSIS — Z888 Allergy status to other drugs, medicaments and biological substances status: Secondary | ICD-10-CM | POA: Diagnosis not present

## 2016-10-09 DIAGNOSIS — F319 Bipolar disorder, unspecified: Secondary | ICD-10-CM | POA: Diagnosis not present

## 2016-10-09 DIAGNOSIS — Z885 Allergy status to narcotic agent status: Secondary | ICD-10-CM | POA: Diagnosis not present

## 2016-10-09 DIAGNOSIS — Z86010 Personal history of colonic polyps: Secondary | ICD-10-CM | POA: Diagnosis not present

## 2016-10-09 DIAGNOSIS — Z91048 Other nonmedicinal substance allergy status: Secondary | ICD-10-CM | POA: Diagnosis not present

## 2016-10-09 LAB — CBC WITH DIFFERENTIAL/PLATELET
BASOS ABS: 0.1 10*3/uL (ref 0–0.1)
BASOS PCT: 1 %
EOS ABS: 0.2 10*3/uL (ref 0–0.7)
Eosinophils Relative: 2 %
HCT: 30.3 % — ABNORMAL LOW (ref 35.0–47.0)
HEMOGLOBIN: 9.9 g/dL — AB (ref 12.0–16.0)
Lymphocytes Relative: 16 %
Lymphs Abs: 1.4 10*3/uL (ref 1.0–3.6)
MCH: 29.9 pg (ref 26.0–34.0)
MCHC: 32.7 g/dL (ref 32.0–36.0)
MCV: 91.5 fL (ref 80.0–100.0)
MONO ABS: 0.6 10*3/uL (ref 0.2–0.9)
MONOS PCT: 7 %
Neutro Abs: 6.4 10*3/uL (ref 1.4–6.5)
Neutrophils Relative %: 74 %
Platelets: 425 10*3/uL (ref 150–440)
RBC: 3.31 MIL/uL — ABNORMAL LOW (ref 3.80–5.20)
RDW: 14.3 % (ref 11.5–14.5)
WBC: 8.6 10*3/uL (ref 3.6–11.0)

## 2016-10-09 LAB — BASIC METABOLIC PANEL
Anion gap: 9 (ref 5–15)
BUN: 39 mg/dL — AB (ref 6–20)
CALCIUM: 8.9 mg/dL (ref 8.9–10.3)
CO2: 22 mmol/L (ref 22–32)
Chloride: 109 mmol/L (ref 101–111)
Creatinine, Ser: 3.06 mg/dL — ABNORMAL HIGH (ref 0.44–1.00)
GFR calc Af Amer: 17 mL/min — ABNORMAL LOW (ref >60)
GFR, EST NON AFRICAN AMERICAN: 15 mL/min — AB (ref >60)
GLUCOSE: 111 mg/dL — AB (ref 65–99)
Potassium: 4.5 mmol/L (ref 3.5–5.1)
Sodium: 140 mmol/L (ref 135–145)

## 2016-10-09 LAB — URINALYSIS, COMPLETE (UACMP) WITH MICROSCOPIC
BILIRUBIN URINE: NEGATIVE
Bacteria, UA: NONE SEEN
GLUCOSE, UA: NEGATIVE mg/dL
KETONES UR: NEGATIVE mg/dL
NITRITE: NEGATIVE
PROTEIN: 100 mg/dL — AB
Specific Gravity, Urine: 1.013 (ref 1.005–1.030)
pH: 5 (ref 5.0–8.0)

## 2016-10-09 MED ORDER — OXYCODONE-ACETAMINOPHEN 5-325 MG PO TABS
2.0000 | ORAL_TABLET | Freq: Once | ORAL | Status: AC
  Administered 2016-10-09: 2 via ORAL
  Filled 2016-10-09: qty 2

## 2016-10-09 MED ORDER — CEFDINIR 300 MG PO CAPS: 300.0000 mg | ORAL_CAPSULE | Freq: Two times a day (BID) | ORAL | 0 refills | Status: DC

## 2016-10-09 NOTE — ED Triage Notes (Addendum)
Patient reports she was admitted last Friday for cyst on her kidney and hematuria and was d/c on Monday. Patient called her kidney doctor due to worsening pain in her Right lower back that radiates to her RLQ and down her R leg and he told her to come to ER. Patient ambulatory in triage with a grimace on face from pain. A&O x4. HX polysystic kidney disease and has not started dialysis trx yet. Access on L arm. Denies chest pain or SOB

## 2016-10-09 NOTE — ED Notes (Signed)
ED Provider at bedside. 

## 2016-10-09 NOTE — ED Provider Notes (Signed)
[facility] Emergency Department Provider Note  ____________________________________________   None    (approximate)  I have reviewed the triage vital signs and the nursing notes.   HISTORY  Chief Complaint Flank Pain    HPI Joyce [name] is a 71 y.o. female see emergency department with 3 days of sharp stabbing severe right flank pain. Pain is nonradiating. She was recently admitted to the hospital with a ruptured right sided renal cyst. She has a history of polycystic kidney disease. At that time she was noted to have a urinary tract infectionthat was Escherichia coli pansensitive and she was treated with ceftriaxone and discharged home with a third generation cephalosporin. Her pain was initially improved on discharge, but the day following discharge recurred and the following day became severe. She's had no fevers or chills. Her hematuria initially cleared on discharge but has recurred in the last 2 days. This morning she called her nephrologist Dr. Holley Raring who advised her to come to the emergency department for further evaluation. She is not on dialysis and still makes urine.   Past Medical History:  Diagnosis Date  . Anal fissure   . Bipolar affective disorder ([place])   . CKD (chronic kidney disease)    Dr Holley Raring [name] [facility]  . Colon polyps   . Diverticulitis   . Diverticulitis   . Family history of adverse reaction to anesthesia    mom - PONV  . GERD (gastroesophageal reflux disease)   . Headache    migraines - none over 10 yrs  . Heart murmur   . History of hiatal hernia   . Hypertension   . Pancreatitis    Valproic acid  . Vertigo     Patient Active Problem List   Diagnosis Date Noted  . Ruptured cyst of kidney 10/03/2016  . HTN (hypertension) 10/01/2016  . Chronic kidney disease 10/01/2016  . Anemia associated with chronic renal failure 09/24/2016  . Acute kidney insufficiency 07/28/2016  . Noninfectious diarrhea   . Benign  neoplasm of cecum   . Benign neoplasm of ascending colon   . Diarrhea   . Nausea   . Gastric polyp   . Chronic constipation 12/27/2014  . Nausea with vomiting 12/27/2014  . Left sided abdominal pain 12/27/2014  . Personal history of colonic polyps 05/10/2013    Past Surgical History:  Procedure Laterality Date  . ABDOMINAL HYSTERECTOMY  1990 ?  . AV FISTULA PLACEMENT  4/30  . BREAST EXCISIONAL BIOPSY Left 1994   neg surgical bx  . CHOLECYSTECTOMY  2003  . COLONOSCOPY  2014   Dr. Jamal Collin  . COLONOSCOPY WITH PROPOFOL N/A 09/24/2015   Procedure: COLONOSCOPY WITH random colon byopies.;  Surgeon: Lucilla Lame, MD;  Location: [facility];  Service: Endoscopy;  Laterality: N/A;  . ESOPHAGOGASTRODUODENOSCOPY (EGD) WITH PROPOFOL N/A 09/24/2015   Procedure: ESOPHAGOGASTRODUODENOSCOPY (EGD) ;  Surgeon: Lucilla Lame, MD;  Location: [place];  Service: Endoscopy;  Laterality: N/A;  . EYE SURGERY    . PERIPHERAL VASCULAR CATHETERIZATION N/A 05/29/2015   Procedure: A/V Shuntogram/Fistulagram;  Surgeon: Katha Cabal, MD;  Location: [facility];  Service: Cardiovascular;  Laterality: N/A;  . PERIPHERAL VASCULAR CATHETERIZATION N/A 05/29/2015   Procedure: A/V Shunt Intervention;  Surgeon: Katha Cabal, MD;  Location: [facility];  Service: Cardiovascular;  Laterality: N/A;  . POLYPECTOMY  09/24/2015   Procedure: POLYPECTOMY INTESTINAL;  Surgeon: Lucilla Lame, MD;  Location: [place];  Service: Endoscopy;;  cecal polyp ascending polyp    Prior to Admission medications   Medication Sig Start Date End Date Taking? Authorizing Provider  acetaminophen (TYLENOL) 325 MG tablet Take 325 mg by mouth every 8 (eight) hours as needed.   Yes Historical Provider, MD  bimatoprost (LUMIGAN) 0.01 % SOLN Place 1 drop into both eyes at bedtime.   Yes Historical Provider, MD  brimonidine (ALPHAGAN) 0.2 % ophthalmic solution Place 1 drop into both eyes 2 (two) times  daily.    Yes Historical Provider, MD  calcitRIOL (ROCALTROL) 0.25 MCG capsule Take 0.25 mcg by mouth daily.   Yes Historical Provider, MD  carbamazepine (TEGRETOL XR) 200 MG 12 hr tablet Take 200 mg by mouth 2 (two) times daily.   Yes Historical Provider, MD  cholecalciferol (VITAMIN D) 1000 UNITS tablet Take 1,000 Units by mouth daily.   Yes Historical Provider, MD  cyanocobalamin (,VITAMIN B-12,) 1000 MCG/ML injection Inject 1,000 mcg into the muscle every 30 (thirty) days.   Yes Historical Provider, MD  pantoprazole (PROTONIX) 40 MG tablet TAKE 1 TABLET EVERY DAY 10/08/16  Yes Lucilla Lame, MD  QUEtiapine (SEROQUEL) 50 MG tablet Take 1 tablet (50 mg total) by mouth at bedtime. She takes two 25mg  tabs at bedtime 10/06/16  Yes Gladstone Lighter, MD  timolol (TIMOPTIC) 0.5 % ophthalmic solution Place 1 drop into both eyes 2 (two) times daily.   Yes Historical Provider, MD  verapamil (CALAN-SR) 240 MG CR tablet Take 240 mg by mouth every evening.    Yes Historical Provider, MD  cefdinir (OMNICEF) 300 MG capsule Take 1 capsule (300 mg total) by mouth 2 (two) times daily. 10/09/16 10/14/16  Darel Hong, MD  traMADol (ULTRAM) 50 MG tablet Take 1 tablet (50 mg total) by mouth every 6 (six) hours as needed for moderate pain or severe pain. Patient not taking: Reported on 10/09/2016 10/06/16   Gladstone Lighter, MD    Allergies Indomethacin; Morphine and related; and Pollen extract  Family History  Problem Relation Age of Onset  . Stroke Father   . Hypertension Father   . Breast cancer Other   . Colon cancer Neg Hx   . Liver disease Neg Hx     Social History Social History  Substance Use Topics  . Smoking status: Never Smoker  . Smokeless tobacco: Never Used  . Alcohol use No    Review of Systems Constitutional: No fever/chills Eyes: No visual changes. ENT: No sore throat. Cardiovascular: Denies chest pain. Respiratory: Denies shortness of breath. Gastrointestinal: Positive abdominal  pain.  Positive nausea, no vomiting.  No diarrhea.  No constipation. Genitourinary: Positive for hematuria Musculoskeletal: Negative for back pain. Skin: Negative for rash. Neurological: Negative for headaches, focal weakness or numbness.  10-point ROS otherwise negative.  ____________________________________________   PHYSICAL EXAM:  VITAL SIGNS: ED Triage Vitals  Enc Vitals Group     BP 10/09/16 1151 139/64     Pulse Rate 10/09/16 1151 71     Resp 10/09/16 1151 18     Temp 10/09/16 1151 98.9 F (37.2 C)     Temp Source 10/09/16 1149 Oral     SpO2 10/09/16 1151 96 %     Weight 10/09/16 1151 178 lb (80.7 kg)     Height 10/09/16 1151 5\' 3"  (1.6 m)     Head Circumference --      Peak Flow --      Pain Score 10/09/16 1151 8     Pain Loc --  Pain Edu? --      Excl. in [place]? --     Constitutional: Alert and oriented x 4 well appearing nontoxic no diaphoresis speaks in full, clear sentences Eyes: PERRL EOMI. Head: Atraumatic. Nose: No congestion/rhinnorhea. Mouth/Throat: No trismus Neck: No stridor.   Cardiovascular: Normal rate, regular rhythm. Grossly normal heart sounds.  Good peripheral circulation. Respiratory: Normal respiratory effort.  No retractions. Lungs CTAB and moving good air Gastrointestinal: Soft nondistended Somewhat tender right sided right flank with no costovertebral tenderness no rebound no guarding and no frank peritonitis Musculoskeletal: No lower extremity edema   Neurologic:  Normal speech and language. No gross focal neurologic deficits are appreciated. Skin:  Skin is warm, dry and intact. No rash noted. Psychiatric: Mood and affect are normal. Speech and behavior are normal.    ____________________________________________   DIFFERENTIAL  Nephrolithiasis, urinary tract infection, pyelonephritis, ruptured cyst ____________________________________________   LABS (all labs ordered are listed, but only abnormal results are displayed)  Labs  Reviewed  URINALYSIS, COMPLETE (UACMP) WITH MICROSCOPIC - Abnormal; Notable for the following:       Result Value   Color, Urine YELLOW (*)    APPearance CLOUDY (*)    Hgb urine dipstick LARGE (*)    Protein, ur 100 (*)    Leukocytes, UA MODERATE (*)    Squamous Epithelial / LPF 0-5 (*)    All other components within normal limits  BASIC METABOLIC PANEL - Abnormal; Notable for the following:    Glucose, Bld 111 (*)    BUN 39 (*)    Creatinine, Ser 3.06 (*)    GFR calc non Af Amer 15 (*)    GFR calc Af Amer 17 (*)    All other components within normal limits  CBC WITH DIFFERENTIAL/PLATELET - Abnormal; Notable for the following:    RBC 3.31 (*)    Hemoglobin 9.9 (*)    HCT 30.3 (*)    All other components within normal limits    GFR is up from last time as well as her hemoglobin urinalysis is consistent with persistent infection __________________________________________  EKG  ____________________________________________  RADIOLOGY  Ultrasound suggestive of recurrent hemorrhagic cyst ____________________________________________   PROCEDURES  Procedure(s) performed: no  Procedures  Critical Care performed: no  ____________________________________________   INITIAL IMPRESSION / ASSESSMENT AND PLAN / ED COURSE  Pertinent labs & imaging results that were available during my care of the patient were reviewed by me and considered in my medical decision making (see chart for details).  Fortunately today the patient's hemoglobin is up from previous and her GFR is up as well. She is having recurrent hematuria and worsening pain which is concerning for another ruptured cyst. Ultrasound is pending and following the results of the ultrasound I will touch base with her nephrologist for further recommendations. I anticipate discharge home.    ----------------------------------------- 4:35 PM on 10/09/2016 -----------------------------------------  I discussed the case with  on-call nephrologist Dr. Juleen China on call for Dr. Holley Raring and he indicated that hemorrhagic cysts are typically treated conservatively.    ----------------------------------------- 4:42 PM on 10/09/2016 -----------------------------------------  I went into the patient's room to discuss her results and Dr. Assunta Gambles recommendations and the patient is frustrated saying she would like some sort of therapy to be done to prevent her cyst from rupturing. She said she will call Dr. Holley Raring herself.  _________________ ----------------------------------------- 4:58 PM on 10/09/2016 ----------------------------------------- The patient was unable to get through to Dr. Holley Raring.  Her pain is difficult to control  as she normally takes Tylenol but that is not currently adequate. She cannot take nonsteroidal medication secondary to renal dysfunction, and she declines all opioid pain medication. She says that at this point she would like to go home and follow-up with Dr. Holley Raring tomorrow. She is calling a ride to pick her up and she is discharged home in stable condition. ___________________________   FINAL CLINICAL IMPRESSION(S) / ED DIAGNOSES  Final diagnoses:  Hemorrhage of cyst of native kidney  Acute cystitis with hematuria      NEW MEDICATIONS STARTED DURING THIS VISIT:  New Prescriptions   CEFDINIR (OMNICEF) 300 MG CAPSULE    Take 1 capsule (300 mg total) by mouth 2 (two) times daily.     Note:  This document was prepared using Dragon voice recognition software and may include unintentional dictation errors.     Darel Hong, MD 10/09/16 [phone]

## 2016-10-09 NOTE — Discharge Instructions (Signed)
Fortunately today you're kidneys are working better than when you are admitted to the hospital. It does look like a urinary tract infection is still there so I'm prescribing you another course of antibiotics. Please complete the antibiotics that you were already prescribed and then take my antibiotics for another 5 days. Follow-up with your nephrologist on Monday for recheck. Return to the emergency department sooner for any new or worsening symptoms such as worsening pain, fevers, or for any other concerns.

## 2016-10-10 ENCOUNTER — Inpatient Hospital Stay
Admission: AD | Admit: 2016-10-10 | Discharge: 2016-10-12 | DRG: 699 | Disposition: A | Discharge status: Home / Self Care | Payer: Medicare HMO | Source: Ambulatory Visit | Attending: Internal Medicine | Admitting: Internal Medicine

## 2016-10-10 DIAGNOSIS — N189 Chronic kidney disease, unspecified: Secondary | ICD-10-CM | POA: Diagnosis not present

## 2016-10-10 DIAGNOSIS — Z885 Allergy status to narcotic agent status: Secondary | ICD-10-CM | POA: Diagnosis not present

## 2016-10-10 DIAGNOSIS — I471 Supraventricular tachycardia: Secondary | ICD-10-CM | POA: Diagnosis present

## 2016-10-10 DIAGNOSIS — Z91048 Other nonmedicinal substance allergy status: Secondary | ICD-10-CM

## 2016-10-10 DIAGNOSIS — J449 Chronic obstructive pulmonary disease, unspecified: Secondary | ICD-10-CM | POA: Diagnosis present

## 2016-10-10 DIAGNOSIS — Z8249 Family history of ischemic heart disease and other diseases of the circulatory system: Secondary | ICD-10-CM | POA: Diagnosis not present

## 2016-10-10 DIAGNOSIS — Z79899 Other long term (current) drug therapy: Secondary | ICD-10-CM

## 2016-10-10 DIAGNOSIS — F319 Bipolar disorder, unspecified: Secondary | ICD-10-CM | POA: Diagnosis present

## 2016-10-10 DIAGNOSIS — K219 Gastro-esophageal reflux disease without esophagitis: Secondary | ICD-10-CM | POA: Diagnosis present

## 2016-10-10 DIAGNOSIS — Z9049 Acquired absence of other specified parts of digestive tract: Secondary | ICD-10-CM

## 2016-10-10 DIAGNOSIS — D631 Anemia in chronic kidney disease: Secondary | ICD-10-CM | POA: Diagnosis present

## 2016-10-10 DIAGNOSIS — K5790 Diverticulosis of intestine, part unspecified, without perforation or abscess without bleeding: Secondary | ICD-10-CM | POA: Diagnosis present

## 2016-10-10 DIAGNOSIS — Z86010 Personal history of colonic polyps: Secondary | ICD-10-CM

## 2016-10-10 DIAGNOSIS — Z90710 Acquired absence of both cervix and uterus: Secondary | ICD-10-CM

## 2016-10-10 DIAGNOSIS — N281 Cyst of kidney, acquired: Secondary | ICD-10-CM

## 2016-10-10 DIAGNOSIS — Q6100 Congenital renal cyst, unspecified: Secondary | ICD-10-CM | POA: Diagnosis not present

## 2016-10-10 DIAGNOSIS — Z888 Allergy status to other drugs, medicaments and biological substances status: Secondary | ICD-10-CM | POA: Diagnosis not present

## 2016-10-10 DIAGNOSIS — H409 Unspecified glaucoma: Secondary | ICD-10-CM | POA: Diagnosis present

## 2016-10-10 DIAGNOSIS — R112 Nausea with vomiting, unspecified: Secondary | ICD-10-CM | POA: Diagnosis present

## 2016-10-10 DIAGNOSIS — Q613 Polycystic kidney, unspecified: Secondary | ICD-10-CM | POA: Diagnosis present

## 2016-10-10 DIAGNOSIS — I120 Hypertensive chronic kidney disease with stage 5 chronic kidney disease or end stage renal disease: Secondary | ICD-10-CM | POA: Diagnosis present

## 2016-10-10 DIAGNOSIS — Z803 Family history of malignant neoplasm of breast: Secondary | ICD-10-CM

## 2016-10-10 DIAGNOSIS — N185 Chronic kidney disease, stage 5: Secondary | ICD-10-CM | POA: Diagnosis present

## 2016-10-10 DIAGNOSIS — N2581 Secondary hyperparathyroidism of renal origin: Secondary | ICD-10-CM | POA: Diagnosis present

## 2016-10-10 DIAGNOSIS — R310 Gross hematuria: Secondary | ICD-10-CM | POA: Diagnosis present

## 2016-10-10 DIAGNOSIS — Z823 Family history of stroke: Secondary | ICD-10-CM | POA: Diagnosis not present

## 2016-10-10 DIAGNOSIS — N390 Urinary tract infection, site not specified: Secondary | ICD-10-CM | POA: Diagnosis present

## 2016-10-10 MED ORDER — LATANOPROST 0.005 % OP SOLN
1.0000 [drp] | Freq: Every day | OPHTHALMIC | Status: DC
  Administered 2016-10-10 – 2016-10-11 (×2): 1 [drp] via OPHTHALMIC
  Filled 2016-10-10: qty 2.5

## 2016-10-10 MED ORDER — OXYCODONE-ACETAMINOPHEN 5-325 MG PO TABS
1.0000 | ORAL_TABLET | Freq: Four times a day (QID) | ORAL | Status: DC | PRN
  Administered 2016-10-11 – 2016-10-12 (×3): 1 via ORAL
  Filled 2016-10-10 (×3): qty 1

## 2016-10-10 MED ORDER — BRIMONIDINE TARTRATE 0.2 % OP SOLN
1.0000 [drp] | Freq: Two times a day (BID) | OPHTHALMIC | Status: DC
  Administered 2016-10-10 – 2016-10-12 (×4): 1 [drp] via OPHTHALMIC
  Filled 2016-10-10: qty 5

## 2016-10-10 MED ORDER — PANTOPRAZOLE SODIUM 40 MG PO TBEC
40.0000 mg | DELAYED_RELEASE_TABLET | Freq: Every day | ORAL | Status: DC
  Administered 2016-10-10 – 2016-10-12 (×3): 40 mg via ORAL
  Filled 2016-10-10 (×3): qty 1

## 2016-10-10 MED ORDER — QUETIAPINE FUMARATE 25 MG PO TABS
50.0000 mg | ORAL_TABLET | Freq: Every day | ORAL | Status: DC
  Administered 2016-10-10 – 2016-10-11 (×2): 50 mg via ORAL
  Filled 2016-10-10 (×2): qty 2

## 2016-10-10 MED ORDER — CALCITRIOL 0.25 MCG PO CAPS
0.2500 ug | ORAL_CAPSULE | Freq: Every day | ORAL | Status: DC
  Administered 2016-10-11 – 2016-10-12 (×2): 0.25 ug via ORAL
  Filled 2016-10-10 (×2): qty 1

## 2016-10-10 MED ORDER — TIMOLOL MALEATE 0.5 % OP SOLN
1.0000 [drp] | Freq: Two times a day (BID) | OPHTHALMIC | Status: DC
  Administered 2016-10-10 – 2016-10-12 (×4): 1 [drp] via OPHTHALMIC
  Filled 2016-10-10: qty 5

## 2016-10-10 MED ORDER — ACETAMINOPHEN 325 MG PO TABS: 325.0000 mg | ORAL_TABLET | Freq: Four times a day (QID) | ORAL | Status: DC | PRN

## 2016-10-10 MED ORDER — VERAPAMIL HCL ER 240 MG PO TBCR
240.0000 mg | EXTENDED_RELEASE_TABLET | Freq: Every evening | ORAL | Status: DC
  Filled 2016-10-10: qty 1

## 2016-10-10 MED ORDER — DEXTROSE 5 % IV SOLN
1.0000 g | INTRAVENOUS | Status: DC
  Administered 2016-10-10 – 2016-10-11 (×2): 1 g via INTRAVENOUS
  Filled 2016-10-10 (×3): qty 10

## 2016-10-10 MED ORDER — VERAPAMIL HCL ER 240 MG PO TBCR
240.0000 mg | EXTENDED_RELEASE_TABLET | Freq: Every day | ORAL | Status: DC
  Administered 2016-10-10 – 2016-10-11 (×2): 240 mg via ORAL
  Filled 2016-10-10 (×2): qty 1

## 2016-10-10 MED ORDER — VITAMIN D 1000 UNITS PO TABS
1000.0000 [IU] | ORAL_TABLET | Freq: Every day | ORAL | Status: DC
  Administered 2016-10-11 – 2016-10-12 (×2): 1000 [IU] via ORAL
  Filled 2016-10-10 (×2): qty 1

## 2016-10-10 MED ORDER — DOCUSATE SODIUM 100 MG PO CAPS: 100.0000 mg | ORAL_CAPSULE | Freq: Two times a day (BID) | ORAL | Status: DC | PRN

## 2016-10-10 MED ORDER — TRAMADOL HCL 50 MG PO TABS: 50.0000 mg | ORAL_TABLET | Freq: Four times a day (QID) | ORAL | Status: DC | PRN

## 2016-10-10 MED ORDER — CARBAMAZEPINE ER 200 MG PO TB12
200.0000 mg | ORAL_TABLET | Freq: Two times a day (BID) | ORAL | Status: DC
  Administered 2016-10-10 – 2016-10-12 (×4): 200 mg via ORAL
  Filled 2016-10-10 (×4): qty 1

## 2016-10-10 MED ORDER — ONDANSETRON HCL 4 MG/2ML IJ SOLN: 4.0000 mg | Freq: Four times a day (QID) | INTRAMUSCULAR | Status: DC | PRN

## 2016-10-10 NOTE — H&P (Signed)
[facility] NAME: Joyce [name]    MR#:  [id number]  DATE OF BIRTH:  [birth year]  DATE OF ADMISSION:  10/10/2016  PRIMARY CARE PHYSICIAN: Cletis Athens, MD   REQUESTING/REFERRING PHYSICIAN: lateef  CHIEF COMPLAINT:  No chief complaint on file.   HISTORY OF PRESENT ILLNESS: Joyce [name]  is a 71 y.o. female with a known history of recent admission for ruptured renal cyst with hematuria and UTI, was treated with antibiotic as per the culture results and sent home 3 days ago. She continued to have right flank pain and hematuria so she came to emergency room yesterday but as her hemoglobin was stable and she did not had any fever, renal ultrasound was also done and it did not show any new findings so she was discharged home again with some prescription pain medications. She was still taking her antibiotics as she was advised to take at discharge 3 days ago. Today she had follow-up appointment with nephrology clinic when Dr. Holley Raring after listening to her story and her symptoms still persist after her discharge suggested to get her admitted directly to the hospital.  PAST MEDICAL HISTORY:   Past Medical History:  Diagnosis Date  . Anal fissure   . Bipolar affective disorder ([place])   . CKD (chronic kidney disease)    Dr Holley Raring [name] 4  . Colon polyps   . Diverticulitis   . Diverticulitis   . Family history of adverse reaction to anesthesia    mom - PONV  . GERD (gastroesophageal reflux disease)   . Headache    migraines - none over 10 yrs  . Heart murmur   . History of hiatal hernia   . Hypertension   . Pancreatitis    Valproic acid  . Vertigo     PAST SURGICAL HISTORY: Past Surgical History:  Procedure Laterality Date  . ABDOMINAL HYSTERECTOMY  1990 ?  . AV FISTULA PLACEMENT  4/30  . BREAST EXCISIONAL BIOPSY Left 1994   neg surgical bx  . CHOLECYSTECTOMY  2003  . COLONOSCOPY  2014   Dr. Jamal Collin  . COLONOSCOPY WITH PROPOFOL N/A  09/24/2015   Procedure: COLONOSCOPY WITH random colon byopies.;  Surgeon: Lucilla Lame, MD;  Location: [facility];  Service: Endoscopy;  Laterality: N/A;  . ESOPHAGOGASTRODUODENOSCOPY (EGD) WITH PROPOFOL N/A 09/24/2015   Procedure: ESOPHAGOGASTRODUODENOSCOPY (EGD) ;  Surgeon: Lucilla Lame, MD;  Location: [facility];  Service: Endoscopy;  Laterality: N/A;  . EYE SURGERY    . PERIPHERAL VASCULAR CATHETERIZATION N/A 05/29/2015   Procedure: A/V Shuntogram/Fistulagram;  Surgeon: Katha Cabal, MD;  Location: [place] [facility];  Service: Cardiovascular;  Laterality: N/A;  . PERIPHERAL VASCULAR CATHETERIZATION N/A 05/29/2015   Procedure: A/V Shunt Intervention;  Surgeon: Katha Cabal, MD;  Location: [place] [facility];  Service: Cardiovascular;  Laterality: N/A;  . POLYPECTOMY  09/24/2015   Procedure: POLYPECTOMY INTESTINAL;  Surgeon: Lucilla Lame, MD;  Location: [facility];  Service: Endoscopy;;  cecal polyp ascending polyp    SOCIAL HISTORY:  Social History  Substance Use Topics  . Smoking status: Never Smoker  . Smokeless tobacco: Never Used  . Alcohol use No    FAMILY HISTORY:  Family History  Problem Relation Age of Onset  . Stroke Father   . Hypertension Father   . Breast cancer Other   . Colon cancer Neg Hx   . Liver disease Neg Hx     DRUG ALLERGIES:  Allergies  Allergen Reactions  . Indomethacin Hives  . Morphine And Related   . Pollen Extract Other (See Comments)    Sinus problems and HA    REVIEW OF SYSTEMS:   CONSTITUTIONAL: No fever, fatigue or weakness.  EYES: No blurred or double vision.  EARS, NOSE, AND THROAT: No tinnitus or ear pain.  RESPIRATORY: No cough, shortness of breath, wheezing or hemoptysis.  CARDIOVASCULAR: No chest pain, orthopnea, edema.  GASTROINTESTINAL: No nausea, vomiting, diarrhea or abdominal pain. She has right flank pain. GENITOURINARY: No dysuria,Positive for hematuria.  ENDOCRINE: No polyuria, nocturia,   HEMATOLOGY: No anemia, easy bruising or bleeding SKIN: No rash or lesion. MUSCULOSKELETAL: No joint pain or arthritis.   NEUROLOGIC: No tingling, numbness, weakness.  PSYCHIATRY: No anxiety or depression.   MEDICATIONS AT HOME:  Prior to Admission medications   Medication Sig Start Date End Date Taking? Authorizing Provider  acetaminophen (TYLENOL) 325 MG tablet Take 325 mg by mouth every 8 (eight) hours as needed.    Historical Provider, MD  bimatoprost (LUMIGAN) 0.01 % SOLN Place 1 drop into both eyes at bedtime.    Historical Provider, MD  brimonidine (ALPHAGAN) 0.2 % ophthalmic solution Place 1 drop into both eyes 2 (two) times daily.     Historical Provider, MD  calcitRIOL (ROCALTROL) 0.25 MCG capsule Take 0.25 mcg by mouth daily.    Historical Provider, MD  carbamazepine (TEGRETOL XR) 200 MG 12 hr tablet Take 200 mg by mouth 2 (two) times daily.    Historical Provider, MD  cefdinir (OMNICEF) 300 MG capsule Take 1 capsule (300 mg total) by mouth 2 (two) times daily. 10/09/16 10/14/16  Darel Hong, MD  cholecalciferol (VITAMIN D) 1000 UNITS tablet Take 1,000 Units by mouth daily.    Historical Provider, MD  cyanocobalamin (,VITAMIN B-12,) 1000 MCG/ML injection Inject 1,000 mcg into the muscle every 30 (thirty) days.    Historical Provider, MD  pantoprazole (PROTONIX) 40 MG tablet TAKE 1 TABLET EVERY DAY 10/08/16   Lucilla Lame, MD  QUEtiapine (SEROQUEL) 50 MG tablet Take 1 tablet (50 mg total) by mouth at bedtime. She takes two 25mg  tabs at bedtime 10/06/16   Gladstone Lighter, MD  timolol (TIMOPTIC) 0.5 % ophthalmic solution Place 1 drop into both eyes 2 (two) times daily.    Historical Provider, MD  traMADol (ULTRAM) 50 MG tablet Take 1 tablet (50 mg total) by mouth every 6 (six) hours as needed for moderate pain or severe pain. Patient not taking: Reported on 10/09/2016 10/06/16   Gladstone Lighter, MD  verapamil (CALAN-SR) 240 MG CR tablet Take 240 mg by mouth every evening.      Historical Provider, MD      PHYSICAL EXAMINATION:   VITAL SIGNS: Blood pressure (!) 144/55, pulse 70, temperature 98.2 F (36.8 C), temperature source Oral, resp. rate 18, height 5\' 3"  (1.6 m), weight 79.8 kg (176 lb), SpO2 98 %.  GENERAL:  71 y.o.-year-old patient lying in the bed with no acute distress.  EYES: Pupils equal, round, reactive to light and accommodation. No scleral icterus. Extraocular muscles intact.  HEENT: Head atraumatic, normocephalic. Oropharynx and nasopharynx clear.  NECK:  Supple, no jugular venous distention. No thyroid enlargement, no tenderness.  LUNGS: Normal breath sounds bilaterally, no wheezing, rales,rhonchi or crepitation. No use of accessory muscles of respiration.  CARDIOVASCULAR: S1, S2 normal. No murmurs, rubs, or gallops.  ABDOMEN: Soft, nontender, nondistended. Bowel sounds present. No organomegaly or mass. Tender on the right side and upper back. EXTREMITIES:  No pedal edema, cyanosis, or clubbing.  NEUROLOGIC: Cranial nerves II through XII are intact. Muscle strength 5/5 in all extremities. Sensation intact. Gait not checked.  PSYCHIATRIC: The patient is alert and oriented x 3.  SKIN: No obvious rash, lesion, or ulcer.   LABORATORY PANEL:   CBC  Recent Labs Lab 10/04/16 0342 10/04/16 1132 10/05/16 0357 10/06/16 0357 10/09/16 1154  WBC 7.8  --  5.9 6.2 8.6  HGB 8.4* 8.3* 8.4* 9.0* 9.9*  HCT 24.3*  --  25.5* 26.8* 30.3*  PLT 225  --  221 260 425  MCV 92.2  --  91.0 92.2 91.5  MCH 31.8  --  30.2 31.0 29.9  MCHC 34.5  --  33.2 33.7 32.7  RDW 14.5  --  14.4 14.4 14.3  LYMPHSABS  --   --   --   --  1.4  MONOABS  --   --   --   --  0.6  EOSABS  --   --   --   --  0.2  BASOSABS  --   --   --   --  0.1   ------------------------------------------------------------------------------------------------------------------  Chemistries   Recent Labs Lab 10/04/16 0342 10/05/16 0357 10/06/16 0357 10/09/16 1154  NA 137 139 140 140  K 3.7  3.8 3.7 4.5  CL 109 109 110 109  CO2 22 23 23 22   GLUCOSE 105* 96 105* 111*  BUN 44* 47* 43* 39*  CREATININE 3.35* 3.64* 3.44* 3.06*  CALCIUM 8.1* 8.2* 8.9 8.9   ------------------------------------------------------------------------------------------------------------------ estimated creatinine clearance is 17.4 mL/min (A) (by C-G formula based on SCr of 3.06 mg/dL (H)). ------------------------------------------------------------------------------------------------------------------ No results for input(s): TSH, T4TOTAL, T3FREE, THYROIDAB in the last 72 hours.  Invalid input(s): FREET3   Coagulation profile No results for input(s): INR, PROTIME in the last 168 hours. ------------------------------------------------------------------------------------------------------------------- No results for input(s): DDIMER in the last 72 hours. -------------------------------------------------------------------------------------------------------------------  Cardiac Enzymes No results for input(s): CKMB, TROPONINI, MYOGLOBIN in the last 168 hours.  Invalid input(s): CK ------------------------------------------------------------------------------------------------------------------ Invalid input(s): POCBNP  ---------------------------------------------------------------------------------------------------------------  Urinalysis    Component Value Date/Time   COLORURINE YELLOW (A) 10/09/2016 1153   APPEARANCEUR CLOUDY (A) 10/09/2016 1153   APPEARANCEUR Clear 05/10/2014 1344   LABSPEC 1.013 10/09/2016 1153   LABSPEC 1.009 05/10/2014 1344   PHURINE 5.0 10/09/2016 1153   GLUCOSEU NEGATIVE 10/09/2016 1153   GLUCOSEU Negative 05/10/2014 1344   HGBUR LARGE (A) 10/09/2016 1153   BILIRUBINUR NEGATIVE 10/09/2016 1153   BILIRUBINUR Negative 05/10/2014 1344   KETONESUR NEGATIVE 10/09/2016 1153   PROTEINUR 100 (A) 10/09/2016 1153   NITRITE NEGATIVE 10/09/2016 1153   LEUKOCYTESUR MODERATE  (A) 10/09/2016 1153   LEUKOCYTESUR Negative 05/10/2014 1344     RADIOLOGY: US Renal  Result Date: 10/09/2016 CLINICAL DATA:  71 year old female with polycystic kidney disease and right flank pain recently. Subsequent encounter. EXAM: RENAL / URINARY TRACT ULTRASOUND COMPLETE COMPARISON:  CTs of the abdomen and pelvis 10/03/2016, 07/28/2016, and earlier. FINDINGS: Right Kidney: Length: 12.6 cm. Polycystic kidney with innumerable macroscopic cysts. No normal right renal cortex identified. The largest is 6.1 cm in the upper pole and appears fairly simple. Images 24 through 29 correspond to the right lower pole heterogeneous cyst with surrounding inflammation seen on the recent CT. Mixed echogenicity material within the cyst lacks internal vascularity (image 26) and is compatible with hemorrhage. The overall cyst size appears stable up to 4.4 cm diameter. Left Kidney: Length: 13.7 cm. Polycystic kidney with macroscopic cysts. No normal left renal  cortex identified. The largest cysts appear simple, measuring up to 3.9 cm diameter. Bladder: Appears normal for degree of bladder distention. IMPRESSION: 1. Right kidney appears stable since the 10/03/2016 CT. Lower pole complex cyst lacking internal vascularity compatible with a hemorrhagic cyst measuring 4.4 cm diameter. 2. Autosomal dominant polycystic kidney disease. Electronically Signed   By: Genevie Ann M.D.   On: 10/09/2016 16:27    EKG: Orders placed or performed during the hospital encounter of 01/05/16  . ED EKG  . ED EKG  . EKG    IMPRESSION AND PLAN:  * Hematuria with ruptured renal cyst   UTI    Recent admission and urology and nephrology consult was done.   I will admitted again with IV ceftriaxone and get the urine culture.   Patient is not on any anticoagulation, get urology consult to help.  * Nausea and vomiting   IV Zofran as needed.  * Flank pain   Percocet as needed.  * Glaucoma     Continue eye drops.  * History of SVT    Continue verapamil, rate is controlled.  * COPD stage IV   Nephrology consult for further management, stable.     All the records are reviewed and case discussed with ED provider. Management plans discussed with the patient, family and they are in agreement.  CODE STATUS: full code  Code Status History    Date Active Date Inactive Code Status Order ID Comments User Context   10/03/2016 11:43 AM 10/06/2016  6:06 PM Full Code [id number]  Gladstone Lighter, MD Inpatient   07/28/2016  2:47 PM 07/31/2016  4:04 PM DNR [id number]  Bettey Costa, MD ED    Advance Directive Documentation     Most Recent Value  Type of Advance Directive  Living will, Healthcare Power of Attorney  Pre-existing out of facility DNR order (yellow form or pink MOST form)  -  "MOST" Form in Place?  -       TOTAL TIME TAKING CARE OF THIS PATIENT: 50  minutes.    Vaughan Basta M.D on 10/10/2016   Between 7am to 6pm - Pager - [phone]  After 6pm go to [email or web address] - password [email or web address] [facility]  Office  [phone]  CC: Primary care physician; Cletis Athens, MD   Note: This dictation was prepared with Dragon dictation along with smaller phrase technology. Any transcriptional errors that result from this process are unintentional.

## 2016-10-11 ENCOUNTER — Encounter: Payer: Self-pay | Admitting: *Deleted

## 2016-10-11 DIAGNOSIS — N189 Chronic kidney disease, unspecified: Secondary | ICD-10-CM

## 2016-10-11 DIAGNOSIS — Q613 Polycystic kidney, unspecified: Secondary | ICD-10-CM

## 2016-10-11 DIAGNOSIS — R310 Gross hematuria: Secondary | ICD-10-CM

## 2016-10-11 DIAGNOSIS — Q6100 Congenital renal cyst, unspecified: Secondary | ICD-10-CM

## 2016-10-11 LAB — CBC
HEMATOCRIT: 26.9 % — AB (ref 35.0–47.0)
Hemoglobin: 9 g/dL — ABNORMAL LOW (ref 12.0–16.0)
MCH: 30.5 pg (ref 26.0–34.0)
MCHC: 33.5 g/dL (ref 32.0–36.0)
MCV: 90.9 fL (ref 80.0–100.0)
Platelets: 368 10*3/uL (ref 150–440)
RBC: 2.96 MIL/uL — AB (ref 3.80–5.20)
RDW: 14 % (ref 11.5–14.5)
WBC: 6.6 10*3/uL (ref 3.6–11.0)

## 2016-10-11 LAB — URINE CULTURE

## 2016-10-11 LAB — BASIC METABOLIC PANEL
Anion gap: 7 (ref 5–15)
BUN: 41 mg/dL — ABNORMAL HIGH (ref 6–20)
CHLORIDE: 108 mmol/L (ref 101–111)
CO2: 24 mmol/L (ref 22–32)
Calcium: 8.8 mg/dL — ABNORMAL LOW (ref 8.9–10.3)
Creatinine, Ser: 3.15 mg/dL — ABNORMAL HIGH (ref 0.44–1.00)
GFR calc non Af Amer: 14 mL/min — ABNORMAL LOW (ref >60)
GFR, EST AFRICAN AMERICAN: 16 mL/min — AB (ref >60)
Glucose, Bld: 96 mg/dL (ref 65–99)
POTASSIUM: 3.9 mmol/L (ref 3.5–5.1)
SODIUM: 139 mmol/L (ref 135–145)

## 2016-10-11 NOTE — Consult Note (Signed)
11:06 AM   [facility] [facility] 07/04/1946 [id number]  Referring provider: Dr. Doylene Canning  CC: Rupture right renal cyst  HPI: The patient is a 71 year old female with a past medical history significant for polycystic kidney disease, rupture of renal cysts 2 resulting in hematuria, and CKD Stage IV presented to the hospital with persistent right flank pain and gross hematuria after being discharged from the hospital 5 days ago for a ruptured right renal cysts. At the time of discharge, her hemoglobin was stable and her pain was well-controlled and hematuria was improving. She was also treated for Escherichia coli UTI at that time. She presented to the ER 2 days ago. Her was found to be stable and a renal ultrasound showed the ruptured hemorrhagic cyst to be stable in size at 4.4 cm compared to her CT at her last admission eight days ago. He was discharged from the emergency department. She followed up with her nephrologist who was concerned with her persistent symptoms and had a direct admit to the hospital.  At this point, she does complain of continued right flank pain that is well controlled with oral pain medication. She endorses light pink hematuria without clots. She denies nausea, vomiting, fevers, or chills. She has no other complaints at this time. Her hemoglobin currently is stable at 9 which is higher than it was at discharge earlier this week.  She has had this problem once before January 2018. At that time she also had an Escherichia coli urinary tract infection. Her gross hematuria did subsequently stop with conservative management. She has had no other episodes of gross hematuria and flank pain outside of these two episodes.  Her creatinine is currently 3.15 which is at her baseline.    PMH: Past Medical History:  Diagnosis Date  . Anal fissure   . Bipolar affective disorder ([place])   . CKD (chronic kidney disease)    Dr Holley Raring [name] 4  . Colon polyps   . Diverticulitis     . Diverticulitis   . Family history of adverse reaction to anesthesia    mom - PONV  . GERD (gastroesophageal reflux disease)   . Headache    migraines - none over 10 yrs  . Heart murmur   . History of hiatal hernia   . Hypertension   . Pancreatitis    Valproic acid  . Vertigo     Surgical History: Past Surgical History:  Procedure Laterality Date  . ABDOMINAL HYSTERECTOMY  1990 ?  . AV FISTULA PLACEMENT  4/30  . BREAST EXCISIONAL BIOPSY Left 1994   neg surgical bx  . CHOLECYSTECTOMY  2003  . COLONOSCOPY  2014   Dr. Jamal Collin  . COLONOSCOPY WITH PROPOFOL N/A 09/24/2015   Procedure: COLONOSCOPY WITH random colon byopies.;  Surgeon: Lucilla Lame, MD;  Location: [facility];  Service: Endoscopy;  Laterality: N/A;  . ESOPHAGOGASTRODUODENOSCOPY (EGD) WITH PROPOFOL N/A 09/24/2015   Procedure: ESOPHAGOGASTRODUODENOSCOPY (EGD) ;  Surgeon: Lucilla Lame, MD;  Location: [place] [facility];  Service: Endoscopy;  Laterality: N/A;  . EYE SURGERY    . PERIPHERAL VASCULAR CATHETERIZATION N/A 05/29/2015   Procedure: A/V Shuntogram/Fistulagram;  Surgeon: Katha Cabal, MD;  Location: [place] [facility];  Service: Cardiovascular;  Laterality: N/A;  . PERIPHERAL VASCULAR CATHETERIZATION N/A 05/29/2015   Procedure: A/V Shunt Intervention;  Surgeon: Katha Cabal, MD;  Location: [place] [facility];  Service: Cardiovascular;  Laterality: N/A;  . POLYPECTOMY  09/24/2015   Procedure: POLYPECTOMY INTESTINAL;  Surgeon: Lucilla Lame, MD;  Location: [place];  Service: Endoscopy;;  cecal polyp ascending polyp    Home Medications:    Allergies:  Allergies  Allergen Reactions  . Indomethacin Hives  . Morphine And Related   . Pollen Extract Other (See Comments)    Sinus problems and HA    Family History: Family History  Problem Relation Age of Onset  . Stroke Father   . Hypertension Father   . Breast cancer Other   . Colon cancer Neg Hx   . Liver disease Neg Hx      Social History:  reports that she has never smoked. She has never used smokeless tobacco. She reports that she does not drink alcohol or use drugs.  ROS: 12 point ROS negative except for above                                        Physical Exam: BP (!) 124/58 (BP Location: Right Arm)   Pulse 72   Temp 98.3 F (36.8 C) (Oral)   Resp 18   Ht 5\' 3"  (1.6 m)   Wt 176 lb (79.8 kg)   SpO2 98%   BMI 31.18 kg/m   Constitutional:  Alert and oriented, No acute distress. HEENT: [place] AT, moist mucus membranes.  Trachea midline, no masses. Cardiovascular: No clubbing, cyanosis, or edema. Respiratory: Normal respiratory effort, no increased work of breathing. GI: Abdomen is soft, nontender, nondistended, no abdominal masses GU: Mild right CVA tenderness.  Skin: No rashes, bruises or suspicious lesions. Lymph: No cervical or inguinal adenopathy. Neurologic: Grossly intact, no focal deficits, moving all 4 extremities. Psychiatric: Normal mood and affect.  Laboratory Data: Lab Results  Component Value Date   WBC 6.6 10/11/2016   HGB 9.0 (L) 10/11/2016   HCT 26.9 (L) 10/11/2016   MCV 90.9 10/11/2016   PLT 368 10/11/2016    Lab Results  Component Value Date   CREATININE 3.15 (H) 10/11/2016    No results found for: PSA  No results found for: TESTOSTERONE  No results found for: HGBA1C  Urinalysis    Component Value Date/Time   COLORURINE YELLOW (A) 10/09/2016 1153   APPEARANCEUR CLOUDY (A) 10/09/2016 1153   APPEARANCEUR Clear 05/10/2014 1344   LABSPEC 1.013 10/09/2016 1153   LABSPEC 1.009 05/10/2014 1344   PHURINE 5.0 10/09/2016 1153   GLUCOSEU NEGATIVE 10/09/2016 1153   GLUCOSEU Negative 05/10/2014 1344   HGBUR LARGE (A) 10/09/2016 1153   BILIRUBINUR NEGATIVE 10/09/2016 1153   BILIRUBINUR Negative 05/10/2014 1344   KETONESUR NEGATIVE 10/09/2016 1153   PROTEINUR 100 (A) 10/09/2016 1153   NITRITE NEGATIVE 10/09/2016 1153   LEUKOCYTESUR MODERATE  (A) 10/09/2016 1153   LEUKOCYTESUR Negative 05/10/2014 1344    Pertinent Imaging: Renal ultrasound reviewed as above  Assessment & Plan:    1. Polycystic kidney disease 2. Gross hematuria 3. Ruptured right hemorrhagic cyst 4. CKD The patient remains stable after having ruptured a right renal cyst approximately 8 days ago. Her renal ultrasound is reassuring as is her stable hemoglobin. I would expect for her to have persistent right flank pain that should improve over the coming weeks. I would also expect her to have gross hematuria that should also slowly resolve. As long as she does not have clots with urination and her hemoglobin remains stable, there is no reason for concern. From a urological standpoint, the patient is  stable for discharge home.   Nickie Retort, MD  [facility] [address], [place], [place] [address] [phone]

## 2016-10-11 NOTE — Progress Notes (Signed)
[facility]  ROUNDING NOTE   Subjective:   Ms. Symiah Nowotny Abrigo admitted to [facility] on 10/10/2016 for uti   Patient was discharged from [facility] from 3/16 to 3/19 for ruptured cyst of right kidney. She states that her pain was never controlled. She went to the ED on 3/22 but was sent home with supportive care.  Patient called our office and requested a direct admission for pain control.    Objective:  Vital signs in last 24 hours:  Temp:  [98.2 F (36.8 C)-98.6 F (37 C)] 98.3 F (36.8 C) (03/24 0419) Pulse Rate:  [70-76] 72 (03/24 0419) Resp:  [18] 18 (03/24 0419) BP: (124-144)/(55-58) 124/58 (03/24 0419) SpO2:  [98 %] 98 % (03/24 0419) Weight:  [79.8 kg (176 lb)] 79.8 kg (176 lb) (03/23 1812)  Weight change:  Filed Weights   10/10/16 1812  Weight: 79.8 kg (176 lb)    Intake/Output: I/O last 3 completed shifts: In: 23 [IV Piggyback:50] Out: 1300 [Urine:1300]   Intake/Output this shift:  Total I/O In: 240 [P.O.:240] Out: 500 [Urine:500]  Physical Exam: General: No acute distress  Head: Normocephalic, atraumatic. Moist oral mucosal membranes  Eyes: Anicteric  Neck: Supple, trachea midline  Lungs:  Clear to auscultation, normal effort  Heart: S1S2 no rubs  Back: Tender right flank,   Abdomen:  Soft, mild right flank tenderness, BS present  Extremities: no peripheral edema.  Neurologic: Nonfocal, moving all four extremities  Skin: No lesions  Access: LUE AVF with good bruit and thrill    Basic Metabolic Panel:  Recent Labs Lab 10/05/16 0357 10/06/16 0357 10/09/16 1154 10/11/16 0418  NA 139 140 140 139  K 3.8 3.7 4.5 3.9  CL 109 110 109 108  CO2 23 23 22 24   GLUCOSE 96 105* 111* 96  BUN 47* 43* 39* 41*  CREATININE 3.64* 3.44* 3.06* 3.15*  CALCIUM 8.2* 8.9 8.9 8.8*    Liver Function Tests: No results for input(s): AST, ALT, ALKPHOS, BILITOT, PROT, ALBUMIN in the last 168 hours. No results for input(s): LIPASE, AMYLASE in the last 168  hours. No results for input(s): AMMONIA in the last 168 hours.  CBC:  Recent Labs Lab 10/04/16 1132 10/05/16 0357 10/06/16 0357 10/09/16 1154 10/11/16 0418  WBC  --  5.9 6.2 8.6 6.6  NEUTROABS  --   --   --  6.4  --   HGB 8.3* 8.4* 9.0* 9.9* 9.0*  HCT  --  25.5* 26.8* 30.3* 26.9*  MCV  --  91.0 92.2 91.5 90.9  PLT  --  221 260 425 368    Cardiac Enzymes: No results for input(s): CKTOTAL, CKMB, CKMBINDEX, TROPONINI in the last 168 hours.  BNP: Invalid input(s): POCBNP  CBG: No results for input(s): GLUCAP in the last 168 hours.  Microbiology: Results for orders placed or performed during the hospital encounter of 10/03/16  Urine culture     Status: Abnormal   Collection Time: 10/03/16  8:33 AM  Result Value Ref Range Status   Specimen Description URINE, CLEAN CATCH  Final   Special Requests NONE  Final   Culture >=100,000 COLONIES/mL ESCHERICHIA COLI (A)  Final   Report Status 10/05/2016 FINAL  Final   Organism ID, Bacteria ESCHERICHIA COLI (A)  Final      Susceptibility   Escherichia coli - MIC*    AMPICILLIN 8 SENSITIVE Sensitive     CEFAZOLIN <=4 SENSITIVE Sensitive     CEFTRIAXONE <=1 SENSITIVE Sensitive  CIPROFLOXACIN 1 SENSITIVE Sensitive     GENTAMICIN <=1 SENSITIVE Sensitive     IMIPENEM <=0.25 SENSITIVE Sensitive     NITROFURANTOIN <=16 SENSITIVE Sensitive     TRIMETH/SULFA <=20 SENSITIVE Sensitive     AMPICILLIN/SULBACTAM 4 SENSITIVE Sensitive     PIP/TAZO <=4 SENSITIVE Sensitive     Extended ESBL NEGATIVE Sensitive     * >=100,000 COLONIES/mL ESCHERICHIA COLI    Coagulation Studies: No results for input(s): LABPROT, INR in the last 72 hours.  Urinalysis:  Recent Labs  10/09/16 1153  COLORURINE YELLOW*  LABSPEC 1.013  PHURINE 5.0  GLUCOSEU NEGATIVE  HGBUR LARGE*  BILIRUBINUR NEGATIVE  KETONESUR NEGATIVE  PROTEINUR 100*  NITRITE NEGATIVE  LEUKOCYTESUR MODERATE*      Imaging: US Renal  Result Date: 10/09/2016 CLINICAL DATA:   71 year old female with polycystic kidney disease and right flank pain recently. Subsequent encounter. EXAM: RENAL / URINARY TRACT ULTRASOUND COMPLETE COMPARISON:  CTs of the abdomen and pelvis 10/03/2016, 07/28/2016, and earlier. FINDINGS: Right Kidney: Length: 12.6 cm. Polycystic kidney with innumerable macroscopic cysts. No normal right renal cortex identified. The largest is 6.1 cm in the upper pole and appears fairly simple. Images 24 through 29 correspond to the right lower pole heterogeneous cyst with surrounding inflammation seen on the recent CT. Mixed echogenicity material within the cyst lacks internal vascularity (image 26) and is compatible with hemorrhage. The overall cyst size appears stable up to 4.4 cm diameter. Left Kidney: Length: 13.7 cm. Polycystic kidney with macroscopic cysts. No normal left renal cortex identified. The largest cysts appear simple, measuring up to 3.9 cm diameter. Bladder: Appears normal for degree of bladder distention. IMPRESSION: 1. Right kidney appears stable since the 10/03/2016 CT. Lower pole complex cyst lacking internal vascularity compatible with a hemorrhagic cyst measuring 4.4 cm diameter. 2. Autosomal dominant polycystic kidney disease. Electronically Signed   By: Genevie Ann M.D.   On: 10/09/2016 16:27     Medications:    . brimonidine  1 drop Both Eyes BID  . calcitRIOL  0.25 mcg Oral Daily  . carbamazepine  200 mg Oral BID  . cefTRIAXone (ROCEPHIN)  IV  1 g Intravenous Q24H  . cholecalciferol  1,000 Units Oral Daily  . latanoprost  1 drop Both Eyes QHS  . pantoprazole  40 mg Oral Daily  . QUEtiapine  50 mg Oral QHS  . timolol  1 drop Both Eyes BID  . verapamil  240 mg Oral QHS   acetaminophen, docusate sodium, ondansetron (ZOFRAN) IV, oxyCODONE-acetaminophen, traMADol  Assessment/ Plan:  71 year old Caucasian female with past medical history of diverticulosis, hypertension, bipolar disorder, polycystic kidneys noted on CT scan who returns for  followup of chronic kidney disease stage IV, polycystic kidney disease.  1. Chronic kidney disease stage 5 secondary to polycystic kidney with proteinuria and gross hematuria: Patient has had recurrent cyst rupture. Given the cyst rupture she is at high risk for urinary tract infection.  - continue ceftriaxone.  - no indication for dialysis.    2. Hypertension. Blood pressure currently at goal - Continue verapamil.   3. Secondary hyperparathyroidism. We will continue to monitor bone mineral metabolism parameters as an outpatient.  - calcitriol. Not currently on binders  4. Anemia chronic kidney disease.  Hemoglobin 9 - concerning for GU losses. Continue to monitor.  - Consider EPO   LOS: [place], [place] 3/24/201811:11 AM

## 2016-10-11 NOTE — Progress Notes (Signed)
[place] at [facility]                                                                                                                                                                                  Patient Demographics   Joyce [name], is a 71 y.o. female, DOB - Apr 26, 1946, WNI:[id number]  Admit date - 10/10/2016   Admitting Physician Vaughan Basta, MD  Outpatient Primary MD for the patient is MASOUD,JAVED, MD   LOS - 1  Subjective: Patient continues to complain of pain in her flank. Still has hematuria.    Review of Systems:   CONSTITUTIONAL: No documented fever. No fatigue, weakness. No weight gain, no weight loss.  EYES: No blurry or double vision.  ENT: No tinnitus. No postnasal drip. No redness of the oropharynx.  RESPIRATORY: No cough, no wheeze, no hemoptysis. No dyspnea.  CARDIOVASCULAR: No chest pain. No orthopnea. No palpitations. No syncope.  GASTROINTESTINAL: No nausea, no vomiting or diarrhea. No abdominal pain. No melena or hematochezia.  GENITOURINARY: No dysuria or hematuria.  ENDOCRINE: No polyuria or nocturia. No heat or cold intolerance.  HEMATOLOGY: No anemia. No bruising. No bleeding.  INTEGUMENTARY: No rashes. No lesions.  MUSCULOSKELETAL+ flank pain pain NEUROLOGIC: No numbness, tingling, or ataxia. No seizure-type activity.  PSYCHIATRIC: No anxiety. No insomnia. No ADD.    Vitals:   Vitals:   10/10/16 1707 10/10/16 1812 10/10/16 2105 10/11/16 0419  BP: (!) 144/55  (!) 136/57 (!) 124/58  Pulse: 70  76 72  Resp: 18  18 18   Temp: 98.2 F (36.8 C)  98.6 F (37 C) 98.3 F (36.8 C)  TempSrc: Oral  Oral Oral  SpO2: 98%  98% 98%  Weight:  176 lb (79.8 kg)    Height:  5\' 3"  (1.6 m)      Wt Readings from Last 3 Encounters:  10/10/16 176 lb (79.8 kg)  10/09/16 178 lb (80.7 kg)  10/03/16 176 lb (79.8 kg)     Intake/Output Summary (Last 24 hours) at 10/11/16 1158 Last data filed at 10/11/16 1020  Gross per 24  hour  Intake              290 ml  Output             1800 ml  Net            -1510 ml    Physical Exam:   GENERAL: Pleasant-appearing in no apparent distress.  HEAD, EYES, EARS, NOSE AND THROAT: Atraumatic, normocephalic. Extraocular muscles are intact. Pupils equal and reactive to light. Sclerae anicteric. No conjunctival injection. No oro-pharyngeal erythema.  NECK: Supple. There is no jugular venous  distention. No bruits, no lymphadenopathy, no thyromegaly.  HEART: Regular rate and rhythm,. No murmurs, no rubs, no clicks.  LUNGS: Clear to auscultation bilaterally. No rales or rhonchi. No wheezes.  ABDOMEN: Soft, flat, nontender, nondistended. Has good bowel sounds. No hepatosplenomegaly appreciated.  EXTREMITIES: No evidence of any cyanosis, clubbing, or peripheral edema.  +2 pedal and radial pulses bilaterally.  NEUROLOGIC: The patient is alert, awake, and oriented x3 with no focal motor or sensory deficits appreciated bilaterally.  SKIN: Moist and warm with no rashes appreciated.  Psych: Not anxious, depressed LN: No inguinal LN enlargement    Antibiotics   Anti-infectives    Start     Dose/Rate Route Frequency Ordered Stop   10/10/16 1900  cefTRIAXone (ROCEPHIN) 1 g in dextrose 5 % 50 mL IVPB     1 g 100 mL/hr over 30 Minutes Intravenous Every 24 hours 10/10/16 1850        Medications   Scheduled Meds: . brimonidine  1 drop Both Eyes BID  . calcitRIOL  0.25 mcg Oral Daily  . carbamazepine  200 mg Oral BID  . cefTRIAXone (ROCEPHIN)  IV  1 g Intravenous Q24H  . cholecalciferol  1,000 Units Oral Daily  . latanoprost  1 drop Both Eyes QHS  . pantoprazole  40 mg Oral Daily  . QUEtiapine  50 mg Oral QHS  . timolol  1 drop Both Eyes BID  . verapamil  240 mg Oral QHS   Continuous Infusions: PRN Meds:.acetaminophen, docusate sodium, ondansetron (ZOFRAN) IV, oxyCODONE-acetaminophen, traMADol   Data Review:   Micro Results Recent Results (from the past 240 hour(s))   Urine culture     Status: Abnormal   Collection Time: 10/03/16  8:33 AM  Result Value Ref Range Status   Specimen Description URINE, CLEAN CATCH  Final   Special Requests NONE  Final   Culture >=100,000 COLONIES/mL ESCHERICHIA COLI (A)  Final   Report Status 10/05/2016 FINAL  Final   Organism ID, Bacteria ESCHERICHIA COLI (A)  Final      Susceptibility   Escherichia coli - MIC*    AMPICILLIN 8 SENSITIVE Sensitive     CEFAZOLIN <=4 SENSITIVE Sensitive     CEFTRIAXONE <=1 SENSITIVE Sensitive     CIPROFLOXACIN 1 SENSITIVE Sensitive     GENTAMICIN <=1 SENSITIVE Sensitive     IMIPENEM <=0.25 SENSITIVE Sensitive     NITROFURANTOIN <=16 SENSITIVE Sensitive     TRIMETH/SULFA <=20 SENSITIVE Sensitive     AMPICILLIN/SULBACTAM 4 SENSITIVE Sensitive     PIP/TAZO <=4 SENSITIVE Sensitive     Extended ESBL NEGATIVE Sensitive     * >=100,000 COLONIES/mL ESCHERICHIA COLI    Radiology Reports US Renal  Result Date: 10/09/2016 CLINICAL DATA:  71 year old female with polycystic kidney disease and right flank pain recently. Subsequent encounter. EXAM: RENAL / URINARY TRACT ULTRASOUND COMPLETE COMPARISON:  CTs of the abdomen and pelvis 10/03/2016, 07/28/2016, and earlier. FINDINGS: Right Kidney: Length: 12.6 cm. Polycystic kidney with innumerable macroscopic cysts. No normal right renal cortex identified. The largest is 6.1 cm in the upper pole and appears fairly simple. Images 24 through 29 correspond to the right lower pole heterogeneous cyst with surrounding inflammation seen on the recent CT. Mixed echogenicity material within the cyst lacks internal vascularity (image 26) and is compatible with hemorrhage. The overall cyst size appears stable up to 4.4 cm diameter. Left Kidney: Length: 13.7 cm. Polycystic kidney with macroscopic cysts. No normal left renal cortex identified. The largest cysts appear simple, measuring  up to 3.9 cm diameter. Bladder: Appears normal for degree of bladder distention.  IMPRESSION: 1. Right kidney appears stable since the 10/03/2016 CT. Lower pole complex cyst lacking internal vascularity compatible with a hemorrhagic cyst measuring 4.4 cm diameter. 2. Autosomal dominant polycystic kidney disease. Electronically Signed   By: Genevie Ann M.D.   On: 10/09/2016 16:27   Ct Renal Stone Study  Result Date: 10/03/2016 CLINICAL DATA:  Right flank pain and hematuria. History of chronic kidney disease and polycystic kidney disease. EXAM: CT ABDOMEN AND PELVIS WITHOUT CONTRAST TECHNIQUE: Multidetector CT imaging of the abdomen and pelvis was performed following the standard protocol without IV contrast. COMPARISON:  07/28/2016 and multiple additional prior CT examinations. FINDINGS: Lower chest: No acute abnormality. Hepatobiliary: Unenhanced appearance of the liver is unremarkable. Stable dilatation of the common bile duct post cholecystectomy measuring up to approximately 11 mm. No visualized calculi within the bile ducts by CT. Pancreas: Unremarkable. No pancreatic ductal dilatation or surrounding inflammatory changes. Spleen: Normal in size without focal abnormality. Adrenals/Urinary Tract: Bilateral polycystic kidney disease again noted. There is new hemorrhage within an lower pole right exophytic cyst measuring approximately 4 cm. There also is new perinephric stranding adjacent to this cyst and extending below the right kidney. Findings are suggestive of both acute hemorrhage into a pre-existing cyst as well as a component of cyst rupture. Previous hemorrhage into a posterior upper right renal cyst has resolved. Stable increased density in a small superior left renal cyst is consistent with prior hemorrhage. No hydronephrosis or renal calculi identified. No evidence of ureteral calculi. The bladder is decompressed. Adrenal glands are unremarkable. Stomach/Bowel: Bowel shows no evidence of obstruction or inflammation. No free air. No abnormal fluid collections. Vascular/Lymphatic: No  enlarged lymph nodes identified. Reproductive: Status post hysterectomy. No adnexal masses. Other: No abdominal wall hernia or abnormality. No abdominopelvic ascites. Musculoskeletal: No acute or significant osseous findings. IMPRESSION: Polycystic kidney disease with new acute hemorrhage into a 4 cm lower pole cyst of the right kidney. Adjacent perinephric stranding extending below the right kidney is also suggestive of a component of cyst rupture. No renal calculi or component of obstructive uropathy identified. Electronically Signed   By: Aletta Edouard M.D.   On: 10/03/2016 09:08     CBC  Recent Labs Lab 10/05/16 0357 10/06/16 0357 10/09/16 1154 10/11/16 0418  WBC 5.9 6.2 8.6 6.6  HGB 8.4* 9.0* 9.9* 9.0*  HCT 25.5* 26.8* 30.3* 26.9*  PLT 221 260 425 368  MCV 91.0 92.2 91.5 90.9  MCH 30.2 31.0 29.9 30.5  MCHC 33.2 33.7 32.7 33.5  RDW 14.4 14.4 14.3 14.0  LYMPHSABS  --   --  1.4  --   MONOABS  --   --  0.6  --   EOSABS  --   --  0.2  --   BASOSABS  --   --  0.1  --     Chemistries   Recent Labs Lab 10/05/16 0357 10/06/16 0357 10/09/16 1154 10/11/16 0418  NA 139 140 140 139  K 3.8 3.7 4.5 3.9  CL 109 110 109 108  CO2 23 23 22 24   GLUCOSE 96 105* 111* 96  BUN 47* 43* 39* 41*  CREATININE 3.64* 3.44* 3.06* 3.15*  CALCIUM 8.2* 8.9 8.9 8.8*   ------------------------------------------------------------------------------------------------------------------ estimated creatinine clearance is 16.9 mL/min (A) (by C-G formula based on SCr of 3.15 mg/dL (H)). ------------------------------------------------------------------------------------------------------------------ No results for input(s): HGBA1C in the last 72 hours. ------------------------------------------------------------------------------------------------------------------ No results for input(s): CHOL,  HDL, LDLCALC, TRIG, CHOLHDL, LDLDIRECT in the last 72  hours. ------------------------------------------------------------------------------------------------------------------ No results for input(s): TSH, T4TOTAL, T3FREE, THYROIDAB in the last 72 hours.  Invalid input(s): FREET3 ------------------------------------------------------------------------------------------------------------------ No results for input(s): VITAMINB12, FOLATE, FERRITIN, TIBC, IRON, RETICCTPCT in the last 72 hours.  Coagulation profile No results for input(s): INR, PROTIME in the last 168 hours.  No results for input(s): DDIMER in the last 72 hours.  Cardiac Enzymes No results for input(s): CKMB, TROPONINI, MYOGLOBIN in the last 168 hours.  Invalid input(s): CK ------------------------------------------------------------------------------------------------------------------ Invalid input(s): [place]  Patient is a 70 year old with recent admission for ruptured renal cyst readmitted for same   1. Hematuria with ruptured renal cyst Hemoglobin has remained stable. Appreciate urology input they recommended supportive care  2. Recurrent UTI related to rupture cyst continue ceftriaxone and await urine cultures    3.  Nausea and vomiting   IV Zofran as needed.  4.  Glaucoma     Continue eye drops.  5. History of SVT   Continue verapamil, rate is controlled.  6. Chronic kidney disease stage IV   Nephrology consult with no indication for hemodialysis       Code Status Orders        Start     Ordered   10/10/16 1935  Full code  Continuous     10/10/16 1934    Code Status History    Date Active Date Inactive Code Status Order ID Comments User Context   10/03/2016 11:43 AM 10/06/2016  6:06 PM Full Code [id number]  Gladstone Lighter, MD Inpatient   07/28/2016  2:47 PM 07/31/2016  4:04 PM DNR [id number]  Bettey Costa, MD ED    Advance Directive Documentation     Most Recent Value  Type of Advance Directive  Living will, Healthcare  Power of Attorney  Pre-existing out of facility DNR order (yellow form or pink MOST form)  -  "MOST" Form in Place?  -           Consults urology, nephrology  DVT Prophylaxis   SCDs   Lab Results  Component Value Date   PLT 368 10/11/2016     Time Spent in minutes 71min Greater than 50% of time spent in care coordination and counseling patient regarding the condition and plan of care.   Dustin Flock M.D on 10/11/2016 at 11:58 AM  Between 7am to 6pm - Pager - [phone]  After 6pm go to [email or web address] - password [email or web address] [facility]   Office  [phone]

## 2016-10-12 DIAGNOSIS — Q613 Polycystic kidney, unspecified: Secondary | ICD-10-CM

## 2016-10-12 MED ORDER — CEFDINIR 300 MG PO CAPS: 300.0000 mg | ORAL_CAPSULE | Freq: Two times a day (BID) | ORAL | 0 refills | Status: AC

## 2016-10-12 MED ORDER — OXYCODONE-ACETAMINOPHEN 5-325 MG PO TABS: 1.0000 | ORAL_TABLET | Freq: Four times a day (QID) | ORAL | 0 refills | Status: DC | PRN

## 2016-10-12 MED ORDER — DOCUSATE SODIUM 100 MG PO CAPS: 100.0000 mg | ORAL_CAPSULE | Freq: Two times a day (BID) | ORAL | 0 refills | Status: DC | PRN

## 2016-10-12 NOTE — Discharge Summary (Signed)
Joyce [name] at [facility], 71 y.o., DOB October 22, 1945, MRN [id number]. Admission date: 10/10/2016 Discharge Date 10/12/2016 Primary MD Joyce Athens, MD Admitting Physician Joyce Basta, MD  Admission Diagnosis  uti  Discharge Diagnosis   Principal Problem:   Ruptured cyst of kidney   Cystitis   Polycystic kidney   Chronic kidney disease stage V Essential hypertension Secondary hyperparathyroidism Anemia of chronic disease        Hospital Course  Joyce [name]  is a 71 y.o. female with a known history of recent admission for ruptured renal cyst with hematuria and UTI, was treated with antibiotic as per the culture results and sent home 3 days ago. She continued to have right flank pain and hematuria so she came to emergency room yesterday but as her hemoglobin was stable and she did not had any fever, renal ultrasound was also done and it did not show any new changes. She was seen by her nephrologist. Due to persistent pain and she referred her back to the hospital. Patient was admitted to the hospital. Her to have abnormal UA she was treated with antibiotics. She was seen by urology and nephrology her hemoglobins remained stable. Urology stated that this may take few weeks for her symptoms to resolve and hematuria to resolve. There is only supportive care that needs to be done in her hemoglobin needs to be monitored as outpatient.            Consults  nephrology, urology  Significant Tests:  See full reports for all details     US Renal  Result Date: 10/09/2016 CLINICAL DATA:  71 year old female with polycystic kidney disease and right flank pain recently. Subsequent encounter. EXAM: RENAL / URINARY TRACT ULTRASOUND COMPLETE COMPARISON:  CTs of the abdomen and pelvis 10/03/2016, 07/28/2016, and earlier. FINDINGS: Right Kidney: Length: 12.6 cm. Polycystic kidney with innumerable macroscopic cysts. No normal right renal cortex  identified. The largest is 6.1 cm in the upper pole and appears fairly simple. Images 24 through 29 correspond to the right lower pole heterogeneous cyst with surrounding inflammation seen on the recent CT. Mixed echogenicity material within the cyst lacks internal vascularity (image 26) and is compatible with hemorrhage. The overall cyst size appears stable up to 4.4 cm diameter. Left Kidney: Length: 13.7 cm. Polycystic kidney with macroscopic cysts. No normal left renal cortex identified. The largest cysts appear simple, measuring up to 3.9 cm diameter. Bladder: Appears normal for degree of bladder distention. IMPRESSION: 1. Right kidney appears stable since the 10/03/2016 CT. Lower pole complex cyst lacking internal vascularity compatible with a hemorrhagic cyst measuring 4.4 cm diameter. 2. Autosomal dominant polycystic kidney disease. Electronically Signed   By: Joyce [name] M.D.   On: 10/09/2016 16:27   Ct Renal Stone Study  Result Date: 10/03/2016 CLINICAL DATA:  Right flank pain and hematuria. History of chronic kidney disease and polycystic kidney disease. EXAM: CT ABDOMEN AND PELVIS WITHOUT CONTRAST TECHNIQUE: Multidetector CT imaging of the abdomen and pelvis was performed following the standard protocol without IV contrast. COMPARISON:  07/28/2016 and multiple additional prior CT examinations. FINDINGS: Lower chest: No acute abnormality. Hepatobiliary: Unenhanced appearance of the liver is unremarkable. Stable dilatation of the common bile duct post cholecystectomy measuring up to approximately 11 mm. No visualized calculi within the bile ducts by CT. Pancreas: Unremarkable. No pancreatic ductal dilatation or surrounding inflammatory changes. Spleen: Normal in size without focal abnormality. Adrenals/Urinary Tract: Bilateral polycystic kidney disease again noted. There is  new hemorrhage within an lower pole right exophytic cyst measuring approximately 4 cm. There also is new perinephric stranding adjacent  to this cyst and extending below the right kidney. Findings are suggestive of both acute hemorrhage into a pre-existing cyst as well as a component of cyst rupture. Previous hemorrhage into a posterior upper right renal cyst has resolved. Stable increased density in a small superior left renal cyst is consistent with prior hemorrhage. No hydronephrosis or renal calculi identified. No evidence of ureteral calculi. The bladder is decompressed. Adrenal glands are unremarkable. Stomach/Bowel: Bowel shows no evidence of obstruction or inflammation. No free air. No abnormal fluid collections. Vascular/Lymphatic: No enlarged lymph nodes identified. Reproductive: Status post hysterectomy. No adnexal masses. Other: No abdominal wall hernia or abnormality. No abdominopelvic ascites. Musculoskeletal: No acute or significant osseous findings. IMPRESSION: Polycystic kidney disease with new acute hemorrhage into a 4 cm lower pole cyst of the right kidney. Adjacent perinephric stranding extending below the right kidney is also suggestive of a component of cyst rupture. No renal calculi or component of obstructive uropathy identified. Electronically Signed   By: Aletta Edouard M.D.   On: 10/03/2016 09:08       Today   Subjective:   Joyce [name] [place] pain under better control  Objective:   Blood pressure (!) 128/50, pulse 80, temperature 97.6 F (36.4 C), temperature source Oral, resp. rate 16, height 5\' 3"  (1.6 m), weight 176 lb (79.8 kg), SpO2 99 %.  .  Intake/Output Summary (Last 24 hours) at 10/12/16 1407 Last data filed at 10/12/16 0529  Gross per 24 hour  Intake               60 ml  Output             2125 ml  Net            -2065 ml    Exam VITAL SIGNS: Blood pressure (!) 128/50, pulse 80, temperature 97.6 F (36.4 C), temperature source Oral, resp. rate 16, height 5\' 3"  (1.6 m), weight 176 lb (79.8 kg), SpO2 99 %.  GENERAL:  71 y.o.-year-old patient lying in the bed with no acute distress.  EYES:  Pupils equal, round, reactive to light and accommodation. No scleral icterus. Extraocular muscles intact.  HEENT: Head atraumatic, normocephalic. Oropharynx and nasopharynx clear.  NECK:  Supple, no jugular venous distention. No thyroid enlargement, no tenderness.  LUNGS: Normal breath sounds bilaterally, no wheezing, rales,rhonchi or crepitation. No use of accessory muscles of respiration.  CARDIOVASCULAR: S1, S2 normal. No murmurs, rubs, or gallops.  ABDOMEN: Soft, nontender, nondistended. Bowel sounds present. No organomegaly or mass.  EXTREMITIES: No pedal edema, cyanosis, or clubbing.  NEUROLOGIC: Cranial nerves II through XII are intact. Muscle strength 5/5 in all extremities. Sensation intact. Gait not checked.  PSYCHIATRIC: The patient is alert and oriented x 3.  SKIN: No obvious rash, lesion, or ulcer.   Data Review     CBC w Diff: Lab Results  Component Value Date   WBC 6.6 10/11/2016   HGB 9.0 (L) 10/11/2016   HGB 10.3 (L) 11/09/2014   HCT 26.9 (L) 10/11/2016   HCT 31.9 (L) 11/09/2014   PLT 368 10/11/2016   PLT 248 11/09/2014   LYMPHOPCT 16 10/09/2016   LYMPHOPCT 28.3 05/11/2014   MONOPCT 7 10/09/2016   MONOPCT 11.3 05/11/2014   EOSPCT 2 10/09/2016   EOSPCT 1.5 05/11/2014   BASOPCT 1 10/09/2016   BASOPCT 0.5 05/11/2014   CMP: Lab Results  Component  Value Date   NA 139 10/11/2016   NA 139 11/09/2014   K 3.9 10/11/2016   K 4.7 11/09/2014   CL 108 10/11/2016   CL 108 11/09/2014   CO2 24 10/11/2016   CO2 25 11/09/2014   BUN 41 (H) 10/11/2016   BUN 51 (H) 11/09/2014   CREATININE 3.15 (H) 10/11/2016   CREATININE 2.98 (H) 11/09/2014   PROT 7.7 07/28/2016   PROT 5.3 (L) 05/14/2014   ALBUMIN 3.5 07/28/2016   ALBUMIN 2.7 (L) 05/14/2014   BILITOT 0.9 07/28/2016   BILITOT 0.5 05/14/2014   ALKPHOS 82 07/28/2016   ALKPHOS 53 05/14/2014   AST 22 07/28/2016   AST 18 05/14/2014   ALT 17 07/28/2016   ALT 25 05/14/2014  .  Micro Results Recent Results (from the  past 240 hour(s))  Urine culture     Status: Abnormal   Collection Time: 10/03/16  8:33 AM  Result Value Ref Range Status   Specimen Description URINE, CLEAN CATCH  Final   Special Requests NONE  Final   Culture >=100,000 COLONIES/mL ESCHERICHIA COLI (A)  Final   Report Status 10/05/2016 FINAL  Final   Organism ID, Bacteria ESCHERICHIA COLI (A)  Final      Susceptibility   Escherichia coli - MIC*    AMPICILLIN 8 SENSITIVE Sensitive     CEFAZOLIN <=4 SENSITIVE Sensitive     CEFTRIAXONE <=1 SENSITIVE Sensitive     CIPROFLOXACIN 1 SENSITIVE Sensitive     GENTAMICIN <=1 SENSITIVE Sensitive     IMIPENEM <=0.25 SENSITIVE Sensitive     NITROFURANTOIN <=16 SENSITIVE Sensitive     TRIMETH/SULFA <=20 SENSITIVE Sensitive     AMPICILLIN/SULBACTAM 4 SENSITIVE Sensitive     PIP/TAZO <=4 SENSITIVE Sensitive     Extended ESBL NEGATIVE Sensitive     * >=100,000 COLONIES/mL ESCHERICHIA COLI  Urine culture     Status: Abnormal   Collection Time: 10/09/16 11:53 AM  Result Value Ref Range Status   Specimen Description URINE, RANDOM  Final   Special Requests NONE  Final   Culture MULTIPLE SPECIES PRESENT, SUGGEST RECOLLECTION (A)  Final   Report Status 10/11/2016 FINAL  Final        Code Status Orders        Start     Ordered   10/10/16 1935  Full code  Continuous     10/10/16 1934    Code Status History    Date Active Date Inactive Code Status Order ID Comments User Context   10/03/2016 11:43 AM 10/06/2016  6:06 PM Full Code [id number]  Gladstone Lighter, MD Inpatient   07/28/2016  2:47 PM 07/31/2016  4:04 PM DNR [id number]  Bettey Costa, MD ED    Advance Directive Documentation     Most Recent Value  Type of Advance Directive  Living will, Healthcare Power of Attorney  Pre-existing out of facility DNR order (yellow form or pink MOST form)  -  "MOST" Form in Place?  -          Follow-up Information    LATEEF, MUNSOOR, MD Follow up in 4 day(s).   Specialty:  Internal Medicine Contact  information: [place] [address] [phone]           Discharge Medications   Allergies as of 10/12/2016      Reactions   Indomethacin Hives   Morphine And Related    Pollen Extract Other (See Comments)   Sinus problems and HA  Medication List    TAKE these medications   acetaminophen 325 MG tablet Commonly known as:  TYLENOL Take 325 mg by mouth every 8 (eight) hours as needed.   bimatoprost 0.01 % Soln Commonly known as:  LUMIGAN Place 1 drop into both eyes at bedtime.   brimonidine 0.2 % ophthalmic solution Commonly known as:  ALPHAGAN Place 1 drop into both eyes 2 (two) times daily.   calcitRIOL 0.25 MCG capsule Commonly known as:  ROCALTROL Take 0.25 mcg by mouth daily.   carbamazepine 200 MG 12 hr tablet Commonly known as:  TEGRETOL XR Take 200 mg by mouth 2 (two) times daily.   cefdinir 300 MG capsule Commonly known as:  OMNICEF Take 1 capsule (300 mg total) by mouth 2 (two) times daily.   cholecalciferol 1000 units tablet Commonly known as:  VITAMIN D Take 1,000 Units by mouth daily.   cyanocobalamin 1000 MCG/ML injection Commonly known as:  (VITAMIN B-12) Inject 1,000 mcg into the muscle every 30 (thirty) days.   docusate sodium 100 MG capsule Commonly known as:  COLACE Take 1 capsule (100 mg total) by mouth 2 (two) times daily as needed for mild constipation.   oxyCODONE-acetaminophen 5-325 MG tablet Commonly known as:  PERCOCET/ROXICET Take 1 tablet by mouth every 6 (six) hours as needed for moderate pain or severe pain.   pantoprazole 40 MG tablet Commonly known as:  PROTONIX TAKE 1 TABLET EVERY DAY   QUEtiapine 50 MG tablet Commonly known as:  SEROQUEL Take 1 tablet (50 mg total) by mouth at bedtime. She takes two 25mg  tabs at bedtime   timolol 0.5 % ophthalmic solution Commonly known as:  TIMOPTIC Place 1 drop into both eyes 2 (two) times daily.   traMADol 50 MG tablet Commonly known as:   ULTRAM Take 1 tablet (50 mg total) by mouth every 6 (six) hours as needed for moderate pain or severe pain.   verapamil 240 MG CR tablet Commonly known as:  CALAN-SR Take 240 mg by mouth every evening.          Total Time in preparing paper work, data evaluation and todays exam - 35 minutes  Dustin Flock M.D on 10/12/2016 at 2:07 [facility]  [facility]   Office  [phone]

## 2016-10-12 NOTE — Progress Notes (Signed)
MD order to discharge patient home. Discharge instructions, prescriptions and instructions for follow up appointment was reviewed with patient.  Questions were encouraged and there were none.  Will call for wheelchair when patient is dressed and ready.

## 2016-10-12 NOTE — Progress Notes (Signed)
[facility]  ROUNDING NOTE   Subjective:   Pain 7/10 before pain medications. Seems to be well controlled when examined.   Objective:  Vital signs in last 24 hours:  Temp:  [97.6 F (36.4 C)-98.5 F (36.9 C)] 97.6 F (36.4 C) (03/25 0529) Pulse Rate:  [64-80] 80 (03/25 0529) Resp:  [16-20] 16 (03/25 0529) BP: (114-141)/(41-56) 128/50 (03/25 0529) SpO2:  [96 %-99 %] 99 % (03/25 0529)  Weight change:  Filed Weights   10/10/16 1812  Weight: 79.8 kg (176 lb)    Intake/Output: I/O last 3 completed shifts: In: 470 [P.O.:420; IV Piggyback:50] Out: 3710 [[id number]]   Intake/Output this shift:  No intake/output data recorded.  Physical Exam: General: No acute distress  Head: Normocephalic, atraumatic. Moist oral mucosal membranes  Eyes: Anicteric  Neck: Supple, trachea midline  Lungs:  Clear to auscultation, normal effort  Heart: S1S2 no rubs  Back: Tender right flank  Abdomen:  Soft, mild right flank tenderness, BS present  Extremities: no peripheral edema.  Neurologic: Nonfocal, moving all four extremities  Skin: No lesions  Access: LUE AVF with good bruit and thrill    Basic Metabolic Panel:  Recent Labs Lab 10/06/16 0357 10/09/16 1154 10/11/16 0418  NA 140 140 139  K 3.7 4.5 3.9  CL 110 109 108  CO2 23 22 24   GLUCOSE 105* 111* 96  BUN 43* 39* 41*  CREATININE 3.44* 3.06* 3.15*  CALCIUM 8.9 8.9 8.8*    Liver Function Tests: No results for input(s): AST, ALT, ALKPHOS, BILITOT, PROT, ALBUMIN in the last 168 hours. No results for input(s): LIPASE, AMYLASE in the last 168 hours. No results for input(s): AMMONIA in the last 168 hours.  CBC:  Recent Labs Lab 10/06/16 0357 10/09/16 1154 10/11/16 0418  WBC 6.2 8.6 6.6  NEUTROABS  --  6.4  --   HGB 9.0* 9.9* 9.0*  HCT 26.8* 30.3* 26.9*  MCV 92.2 91.5 90.9  PLT 260 425 368    Cardiac Enzymes: No results for input(s): CKTOTAL, CKMB, CKMBINDEX, TROPONINI in the last 168  hours.  BNP: Invalid input(s): POCBNP  CBG: No results for input(s): GLUCAP in the last 168 hours.  Microbiology: Results for orders placed or performed during the hospital encounter of 10/10/16  Urine culture     Status: Abnormal   Collection Time: 10/09/16 11:53 AM  Result Value Ref Range Status   Specimen Description URINE, RANDOM  Final   Special Requests NONE  Final   Culture MULTIPLE SPECIES PRESENT, SUGGEST RECOLLECTION (A)  Final   Report Status 10/11/2016 FINAL  Final    Coagulation Studies: No results for input(s): LABPROT, INR in the last 72 hours.  Urinalysis:  Recent Labs  10/09/16 1153  COLORURINE YELLOW*  LABSPEC 1.013  PHURINE 5.0  GLUCOSEU NEGATIVE  HGBUR LARGE*  BILIRUBINUR NEGATIVE  KETONESUR NEGATIVE  PROTEINUR 100*  NITRITE NEGATIVE  LEUKOCYTESUR MODERATE*      Imaging: No results found.   Medications:    . brimonidine  1 drop Both Eyes BID  . calcitRIOL  0.25 mcg Oral Daily  . carbamazepine  200 mg Oral BID  . cefTRIAXone (ROCEPHIN)  IV  1 g Intravenous Q24H  . cholecalciferol  1,000 Units Oral Daily  . latanoprost  1 drop Both Eyes QHS  . pantoprazole  40 mg Oral Daily  . QUEtiapine  50 mg Oral QHS  . timolol  1 drop Both Eyes BID  . verapamil  240 mg Oral QHS  acetaminophen, docusate sodium, ondansetron (ZOFRAN) IV, oxyCODONE-acetaminophen, traMADol  Assessment/ Plan:  71 year old Caucasian female with past medical history of diverticulosis, hypertension, bipolar disorder, polycystic kidneys noted on CT scan who returns for followup of chronic kidney disease stage IV, polycystic kidney disease.  1. Chronic kidney disease stage 5 secondary to polycystic kidney with proteinuria and gross hematuria: Patient has had recurrent cyst rupture. Given the cyst rupture she is at high risk for urinary tract infection.  - continue antibiotics: ceftriaxone given inpatient, was originally discharged on Omnicef.  - no indication for dialysis.     2. Hypertension. Blood pressure currently at goal - Continue verapamil.   3. Secondary hyperparathyroidism. We will continue to monitor bone mineral metabolism parameters as an outpatient.  - calcitriol. Not currently on binders  4. Anemia chronic kidney disease.  Hemoglobin 9 - concerning for GU losses. Continue to monitor.  - Consider EPO   LOS: 2 Joyce [name] 3/25/201810:38 AM

## 2016-10-12 NOTE — Discharge Instructions (Signed)
[facility] [place] at [facility] ° °DIET:  °Cardiac diet ° °DISCHARGE CONDITION:  °Stable ° °ACTIVITY:  °Activity as tolerated ° °OXYGEN:  °Home Oxygen: No. °  °Oxygen Delivery: room air ° °DISCHARGE LOCATION:  °home  ° ° °ADDITIONAL DISCHARGE INSTRUCTION: ° ° °If you experience worsening of your admission symptoms, develop shortness of breath, life threatening emergency, suicidal or homicidal thoughts you must seek medical attention immediately by calling 911 or calling your MD immediately  if symptoms less severe. ° °You Must read complete instructions/literature along with all the possible adverse reactions/side effects for all the Medicines you take and that have been prescribed to you. Take any new Medicines after you have completely understood and accpet all the possible adverse reactions/side effects.  ° °Please note ° °You were cared for by a hospitalist during your hospital stay. If you have any questions about your discharge medications or the care you received while you were in the hospital after you are discharged, you can call the unit and asked to speak with the hospitalist on call if the hospitalist that took care of you is not available. Once you are discharged, your primary care physician will handle any further medical issues. Please note that NO REFILLS for any discharge medications will be authorized once you are discharged, as it is imperative that you return to your primary care physician (or establish a relationship with a primary care physician if you do not have one) for your aftercare needs so that they can reassess your need for medications and monitor your lab values. ° ° °

## 2016-10-14 DIAGNOSIS — N2581 Secondary hyperparathyroidism of renal origin: Secondary | ICD-10-CM | POA: Diagnosis not present

## 2016-10-14 DIAGNOSIS — I10 Essential (primary) hypertension: Secondary | ICD-10-CM | POA: Diagnosis not present

## 2016-10-14 DIAGNOSIS — N185 Chronic kidney disease, stage 5: Secondary | ICD-10-CM | POA: Diagnosis not present

## 2016-10-14 DIAGNOSIS — D631 Anemia in chronic kidney disease: Secondary | ICD-10-CM | POA: Diagnosis not present

## 2016-10-22 ENCOUNTER — Other Ambulatory Visit: Payer: Commercial Managed Care - HMO

## 2016-10-22 ENCOUNTER — Ambulatory Visit: Payer: Commercial Managed Care - HMO

## 2016-10-31 ENCOUNTER — Other Ambulatory Visit: Payer: Self-pay | Admitting: Hematology and Oncology

## 2016-11-03 ENCOUNTER — Other Ambulatory Visit: Payer: Self-pay | Admitting: *Deleted

## 2016-11-03 DIAGNOSIS — E559 Vitamin D deficiency, unspecified: Secondary | ICD-10-CM | POA: Diagnosis not present

## 2016-11-03 DIAGNOSIS — D122 Benign neoplasm of ascending colon: Secondary | ICD-10-CM

## 2016-11-03 DIAGNOSIS — D120 Benign neoplasm of cecum: Secondary | ICD-10-CM

## 2016-11-03 DIAGNOSIS — N185 Chronic kidney disease, stage 5: Secondary | ICD-10-CM | POA: Diagnosis not present

## 2016-11-03 DIAGNOSIS — Q613 Polycystic kidney, unspecified: Secondary | ICD-10-CM | POA: Diagnosis not present

## 2016-11-03 DIAGNOSIS — I10 Essential (primary) hypertension: Secondary | ICD-10-CM | POA: Diagnosis not present

## 2016-11-05 ENCOUNTER — Ambulatory Visit: Payer: Commercial Managed Care - HMO | Admitting: Hematology and Oncology

## 2016-11-05 ENCOUNTER — Other Ambulatory Visit: Payer: Commercial Managed Care - HMO

## 2016-11-05 ENCOUNTER — Ambulatory Visit: Payer: Commercial Managed Care - HMO

## 2016-11-06 ENCOUNTER — Inpatient Hospital Stay: Payer: Medicare HMO

## 2016-11-06 DIAGNOSIS — N2581 Secondary hyperparathyroidism of renal origin: Secondary | ICD-10-CM | POA: Diagnosis not present

## 2016-11-06 DIAGNOSIS — N185 Chronic kidney disease, stage 5: Secondary | ICD-10-CM | POA: Diagnosis not present

## 2016-11-06 DIAGNOSIS — I10 Essential (primary) hypertension: Secondary | ICD-10-CM | POA: Diagnosis not present

## 2016-11-06 DIAGNOSIS — D631 Anemia in chronic kidney disease: Secondary | ICD-10-CM | POA: Diagnosis not present

## 2016-11-07 ENCOUNTER — Ambulatory Visit: Payer: Medicare HMO

## 2016-11-07 ENCOUNTER — Other Ambulatory Visit: Payer: Medicare HMO

## 2016-11-12 DIAGNOSIS — F3177 Bipolar disorder, in partial remission, most recent episode mixed: Secondary | ICD-10-CM | POA: Diagnosis not present

## 2016-11-21 ENCOUNTER — Other Ambulatory Visit: Payer: Medicare HMO

## 2016-11-21 ENCOUNTER — Ambulatory Visit: Payer: Medicare HMO

## 2016-11-26 ENCOUNTER — Other Ambulatory Visit: Payer: Self-pay | Admitting: *Deleted

## 2016-12-05 ENCOUNTER — Ambulatory Visit: Payer: Medicare HMO

## 2016-12-05 ENCOUNTER — Other Ambulatory Visit: Payer: Medicare HMO

## 2016-12-08 ENCOUNTER — Other Ambulatory Visit: Payer: Medicare HMO

## 2016-12-08 ENCOUNTER — Ambulatory Visit: Payer: Medicare HMO

## 2016-12-08 ENCOUNTER — Ambulatory Visit: Payer: Medicare HMO | Admitting: Hematology and Oncology

## 2016-12-12 ENCOUNTER — Ambulatory Visit: Payer: Medicare HMO

## 2016-12-12 ENCOUNTER — Other Ambulatory Visit: Payer: Medicare HMO

## 2016-12-12 ENCOUNTER — Ambulatory Visit: Payer: Medicare HMO | Admitting: Hematology and Oncology

## 2016-12-12 DIAGNOSIS — N185 Chronic kidney disease, stage 5: Secondary | ICD-10-CM | POA: Diagnosis not present

## 2016-12-12 DIAGNOSIS — N2581 Secondary hyperparathyroidism of renal origin: Secondary | ICD-10-CM | POA: Diagnosis not present

## 2016-12-12 DIAGNOSIS — D631 Anemia in chronic kidney disease: Secondary | ICD-10-CM | POA: Diagnosis not present

## 2016-12-12 DIAGNOSIS — I10 Essential (primary) hypertension: Secondary | ICD-10-CM | POA: Diagnosis not present

## 2016-12-18 ENCOUNTER — Observation Stay
Admission: EM | Admit: 2016-12-18 | Discharge: 2016-12-20 | Disposition: A | Discharge status: Home / Self Care | Payer: Medicare HMO | Attending: Internal Medicine | Admitting: Internal Medicine

## 2016-12-18 ENCOUNTER — Encounter: Payer: Self-pay | Admitting: Emergency Medicine

## 2016-12-18 ENCOUNTER — Emergency Department: Payer: Medicare HMO

## 2016-12-18 DIAGNOSIS — Z885 Allergy status to narcotic agent status: Secondary | ICD-10-CM | POA: Insufficient documentation

## 2016-12-18 DIAGNOSIS — I120 Hypertensive chronic kidney disease with stage 5 chronic kidney disease or end stage renal disease: Secondary | ICD-10-CM | POA: Diagnosis not present

## 2016-12-18 DIAGNOSIS — R319 Hematuria, unspecified: Secondary | ICD-10-CM | POA: Diagnosis present

## 2016-12-18 DIAGNOSIS — Z888 Allergy status to other drugs, medicaments and biological substances status: Secondary | ICD-10-CM | POA: Insufficient documentation

## 2016-12-18 DIAGNOSIS — R310 Gross hematuria: Secondary | ICD-10-CM | POA: Diagnosis not present

## 2016-12-18 DIAGNOSIS — F319 Bipolar disorder, unspecified: Secondary | ICD-10-CM | POA: Insufficient documentation

## 2016-12-18 DIAGNOSIS — Q613 Polycystic kidney, unspecified: Secondary | ICD-10-CM | POA: Insufficient documentation

## 2016-12-18 DIAGNOSIS — Z8249 Family history of ischemic heart disease and other diseases of the circulatory system: Secondary | ICD-10-CM | POA: Diagnosis not present

## 2016-12-18 DIAGNOSIS — R809 Proteinuria, unspecified: Secondary | ICD-10-CM | POA: Insufficient documentation

## 2016-12-18 DIAGNOSIS — N2581 Secondary hyperparathyroidism of renal origin: Secondary | ICD-10-CM | POA: Diagnosis not present

## 2016-12-18 DIAGNOSIS — N185 Chronic kidney disease, stage 5: Secondary | ICD-10-CM | POA: Diagnosis not present

## 2016-12-18 DIAGNOSIS — K219 Gastro-esophageal reflux disease without esophagitis: Secondary | ICD-10-CM | POA: Insufficient documentation

## 2016-12-18 DIAGNOSIS — H409 Unspecified glaucoma: Secondary | ICD-10-CM | POA: Insufficient documentation

## 2016-12-18 DIAGNOSIS — I10 Essential (primary) hypertension: Secondary | ICD-10-CM | POA: Diagnosis not present

## 2016-12-18 DIAGNOSIS — N183 Chronic kidney disease, stage 3 (moderate): Secondary | ICD-10-CM | POA: Diagnosis not present

## 2016-12-18 DIAGNOSIS — D631 Anemia in chronic kidney disease: Secondary | ICD-10-CM | POA: Diagnosis not present

## 2016-12-18 DIAGNOSIS — Z91048 Other nonmedicinal substance allergy status: Secondary | ICD-10-CM | POA: Diagnosis not present

## 2016-12-18 DIAGNOSIS — Z86010 Personal history of colonic polyps: Secondary | ICD-10-CM | POA: Insufficient documentation

## 2016-12-18 DIAGNOSIS — Z79899 Other long term (current) drug therapy: Secondary | ICD-10-CM | POA: Insufficient documentation

## 2016-12-18 DIAGNOSIS — N390 Urinary tract infection, site not specified: Secondary | ICD-10-CM | POA: Diagnosis not present

## 2016-12-18 DIAGNOSIS — N281 Cyst of kidney, acquired: Secondary | ICD-10-CM | POA: Diagnosis not present

## 2016-12-18 LAB — URINALYSIS, COMPLETE (UACMP) WITH MICROSCOPIC
Bacteria, UA: NONE SEEN
SPECIFIC GRAVITY, URINE: 1.011 (ref 1.005–1.030)
SQUAMOUS EPITHELIAL / LPF: NONE SEEN

## 2016-12-18 LAB — BASIC METABOLIC PANEL
Anion gap: 9 (ref 5–15)
BUN: 42 mg/dL — AB (ref 6–20)
CHLORIDE: 104 mmol/L (ref 101–111)
CO2: 26 mmol/L (ref 22–32)
CREATININE: 3.81 mg/dL — AB (ref 0.44–1.00)
Calcium: 9 mg/dL (ref 8.9–10.3)
GFR calc Af Amer: 13 mL/min — ABNORMAL LOW (ref >60)
GFR calc non Af Amer: 11 mL/min — ABNORMAL LOW (ref >60)
Glucose, Bld: 110 mg/dL — ABNORMAL HIGH (ref 65–99)
POTASSIUM: 4.4 mmol/L (ref 3.5–5.1)
SODIUM: 139 mmol/L (ref 135–145)

## 2016-12-18 LAB — HEPATIC FUNCTION PANEL
ALK PHOS: 39 U/L (ref 38–126)
ALT: 7 U/L — AB (ref 14–54)
AST: 12 U/L — ABNORMAL LOW (ref 15–41)
Albumin: 3.5 g/dL (ref 3.5–5.0)
Bilirubin, Direct: <0.1 mg/dL — ABNORMAL LOW (ref 0.1–0.5)
TOTAL PROTEIN: 7 g/dL (ref 6.5–8.1)
Total Bilirubin: 0.5 mg/dL (ref 0.3–1.2)

## 2016-12-18 LAB — CBC
HEMATOCRIT: 30.3 % — AB (ref 35.0–47.0)
HEMOGLOBIN: 10.1 g/dL — AB (ref 12.0–16.0)
MCH: 30.3 pg (ref 26.0–34.0)
MCHC: 33.3 g/dL (ref 32.0–36.0)
MCV: 91.1 fL (ref 80.0–100.0)
Platelets: 318 10*3/uL (ref 150–440)
RBC: 3.33 MIL/uL — AB (ref 3.80–5.20)
RDW: 13.9 % (ref 11.5–14.5)
WBC: 6.5 10*3/uL (ref 3.6–11.0)

## 2016-12-18 LAB — TYPE AND SCREEN
ABO/RH(D): O POS
Antibody Screen: NEGATIVE

## 2016-12-18 MED ORDER — SODIUM CHLORIDE 0.9 % IV SOLN
INTRAVENOUS | Status: DC
  Administered 2016-12-18 – 2016-12-19 (×2): via INTRAVENOUS

## 2016-12-18 MED ORDER — VERAPAMIL HCL ER 240 MG PO TBCR
240.0000 mg | EXTENDED_RELEASE_TABLET | Freq: Every evening | ORAL | Status: DC
  Administered 2016-12-18 – 2016-12-19 (×2): 240 mg via ORAL
  Filled 2016-12-18 (×3): qty 1

## 2016-12-18 MED ORDER — SODIUM CHLORIDE 0.9 % IV BOLUS (SEPSIS)
500.0000 mL | INTRAVENOUS | Status: AC
Start: 2016-12-18 — End: 2016-12-18
  Administered 2016-12-18: 500 mL via INTRAVENOUS

## 2016-12-18 MED ORDER — ACETAMINOPHEN 650 MG RE SUPP: 650.0000 mg | Freq: Four times a day (QID) | RECTAL | Status: DC | PRN

## 2016-12-18 MED ORDER — CIPROFLOXACIN HCL 500 MG PO TABS
500.0000 mg | ORAL_TABLET | Freq: Every day | ORAL | Status: DC
Start: 2016-12-18 — End: 2016-12-20
  Administered 2016-12-18 – 2016-12-20 (×3): 500 mg via ORAL
  Filled 2016-12-18 (×3): qty 1

## 2016-12-18 MED ORDER — ONDANSETRON HCL 4 MG/2ML IJ SOLN: 4.0000 mg | Freq: Four times a day (QID) | INTRAMUSCULAR | Status: DC | PRN

## 2016-12-18 MED ORDER — ACETAMINOPHEN 325 MG PO TABS: 650.0000 mg | ORAL_TABLET | Freq: Four times a day (QID) | ORAL | Status: DC | PRN

## 2016-12-18 MED ORDER — DOCUSATE SODIUM 100 MG PO CAPS: 100.0000 mg | ORAL_CAPSULE | Freq: Two times a day (BID) | ORAL | Status: DC | PRN

## 2016-12-18 MED ORDER — OXYCODONE-ACETAMINOPHEN 5-325 MG PO TABS
1.0000 | ORAL_TABLET | Freq: Once | ORAL | Status: AC
  Administered 2016-12-18: 1 via ORAL
  Filled 2016-12-18: qty 1

## 2016-12-18 MED ORDER — POLYETHYLENE GLYCOL 3350 17 G PO PACK: 17.0000 g | PACK | Freq: Every day | ORAL | Status: DC | PRN

## 2016-12-18 MED ORDER — HYDRALAZINE HCL 20 MG/ML IJ SOLN: 10.0000 mg | Freq: Four times a day (QID) | INTRAMUSCULAR | Status: DC | PRN

## 2016-12-18 MED ORDER — ALBUTEROL SULFATE (2.5 MG/3ML) 0.083% IN NEBU: 2.5000 mg | INHALATION_SOLUTION | RESPIRATORY_TRACT | Status: DC | PRN

## 2016-12-18 MED ORDER — QUETIAPINE FUMARATE 25 MG PO TABS
50.0000 mg | ORAL_TABLET | Freq: Every day | ORAL | Status: DC
  Administered 2016-12-18 – 2016-12-19 (×2): 50 mg via ORAL
  Filled 2016-12-18 (×2): qty 2

## 2016-12-18 MED ORDER — CARBAMAZEPINE ER 200 MG PO TB12
200.0000 mg | ORAL_TABLET | Freq: Two times a day (BID) | ORAL | Status: DC
  Administered 2016-12-18 – 2016-12-20 (×4): 200 mg via ORAL
  Filled 2016-12-18 (×4): qty 1

## 2016-12-18 MED ORDER — TIMOLOL MALEATE 0.5 % OP SOLN
1.0000 [drp] | Freq: Every day | OPHTHALMIC | Status: DC
  Filled 2016-12-18: qty 5

## 2016-12-18 MED ORDER — LATANOPROST 0.005 % OP SOLN
1.0000 [drp] | Freq: Every day | OPHTHALMIC | Status: DC
  Administered 2016-12-18 – 2016-12-19 (×2): 1 [drp] via OPHTHALMIC
  Filled 2016-12-18: qty 2.5

## 2016-12-18 MED ORDER — HYDROCODONE-ACETAMINOPHEN 5-325 MG PO TABS
1.0000 | ORAL_TABLET | ORAL | Status: DC | PRN
  Administered 2016-12-19: 2 via ORAL
  Filled 2016-12-18: qty 2

## 2016-12-18 MED ORDER — ONDANSETRON HCL 4 MG PO TABS: 4.0000 mg | ORAL_TABLET | Freq: Four times a day (QID) | ORAL | Status: DC | PRN

## 2016-12-18 MED ORDER — PANTOPRAZOLE SODIUM 40 MG PO TBEC
40.0000 mg | DELAYED_RELEASE_TABLET | Freq: Every day | ORAL | Status: DC
  Administered 2016-12-19 – 2016-12-20 (×2): 40 mg via ORAL
  Filled 2016-12-18 (×2): qty 1

## 2016-12-18 NOTE — ED Notes (Signed)
Patient transported to CT 

## 2016-12-18 NOTE — H&P (Signed)
[facility] NAME: Joyce [name]    MR#:  [id number]  DATE OF BIRTH:  29-Oct-1945  DATE OF ADMISSION:  12/18/2016  PRIMARY CARE PHYSICIAN: Cletis Athens, MD   REQUESTING/REFERRING PHYSICIAN: Dr. Karma Greaser  CHIEF COMPLAINT:   Chief Complaint  Patient presents with  . Hematuria    HISTORY OF PRESENT ILLNESS:  Joyce [name]  is a 71 y.o. female with a known history of Hypertension, CKD stage IV, polycystic kidney disease, recurrent hematuria, anemia of chronic disease presents to the hospital complaining of worsening hematuria one week. She saw Dr. Holley Raring of nephrology earlier today in the office were advised patient to get admitted to the hospital if her hematuria worsens. She also has bilateral flank pain, dysuria and frequency. She had similar complaints in March when she was admitted and monitored and discharged home. Her hematuria is thought to be due to polycystic kidney disease. Dr. Candiss Norse of nephrology has been informed of patient's presentation in the ER and need for admission. Patient is not on any aspirin or Plavix for anticoagulation. Hemoglobin is stable at 10.1.  PAST MEDICAL HISTORY:   Past Medical History:  Diagnosis Date  . Anal fissure   . Bipolar affective disorder ([place])   . CKD (chronic kidney disease)    Dr Holley Raring [name] [facility]  . Colon polyps   . Diverticulitis   . Diverticulitis   . Family history of adverse reaction to anesthesia    mom - PONV  . GERD (gastroesophageal reflux disease)   . Headache    migraines - none over 10 yrs  . Heart murmur   . History of hiatal hernia   . Hypertension   . Pancreatitis    Valproic acid  . Vertigo     PAST SURGICAL HISTORY:   Past Surgical History:  Procedure Laterality Date  . ABDOMINAL HYSTERECTOMY  1990 ?  . AV FISTULA PLACEMENT  4/30  . BREAST EXCISIONAL BIOPSY Left 1994   neg surgical bx  . CHOLECYSTECTOMY  2003  . COLONOSCOPY  2014   Dr. Jamal Collin  . COLONOSCOPY  WITH PROPOFOL N/A 09/24/2015   Procedure: COLONOSCOPY WITH random colon byopies.;  Surgeon: Lucilla Lame, MD;  Location: [facility];  Service: Endoscopy;  Laterality: N/A;  . ESOPHAGOGASTRODUODENOSCOPY (EGD) WITH PROPOFOL N/A 09/24/2015   Procedure: ESOPHAGOGASTRODUODENOSCOPY (EGD) ;  Surgeon: Lucilla Lame, MD;  Location: [facility];  Service: Endoscopy;  Laterality: N/A;  . EYE SURGERY    . PERIPHERAL VASCULAR CATHETERIZATION N/A 05/29/2015   Procedure: A/V Shuntogram/Fistulagram;  Surgeon: Katha Cabal, MD;  Location: [facility];  Service: Cardiovascular;  Laterality: N/A;  . PERIPHERAL VASCULAR CATHETERIZATION N/A 05/29/2015   Procedure: A/V Shunt Intervention;  Surgeon: Katha Cabal, MD;  Location: [facility];  Service: Cardiovascular;  Laterality: N/A;  . POLYPECTOMY  09/24/2015   Procedure: POLYPECTOMY INTESTINAL;  Surgeon: Lucilla Lame, MD;  Location: [facility];  Service: Endoscopy;;  cecal polyp ascending polyp    SOCIAL HISTORY:   Social History  Substance Use Topics  . Smoking status: Never Smoker  . Smokeless tobacco: Never Used  . Alcohol use No    FAMILY HISTORY:   Family History  Problem Relation Age of Onset  . Stroke Father   . Hypertension Father   . Breast cancer Other   . Colon cancer Neg Hx   . Liver disease Neg Hx     DRUG ALLERGIES:  Allergies  Allergen Reactions  . Indomethacin Hives  . Morphine And Related   . Pollen Extract Other (See Comments)    Sinus problems and HA    REVIEW OF SYSTEMS:   Review of Systems  Constitutional: Positive for malaise/fatigue. Negative for chills and fever.  HENT: Negative for sore throat.   Eyes: Negative for blurred vision, double vision and pain.  Respiratory: Negative for cough, hemoptysis, shortness of breath and wheezing.   Cardiovascular: Negative for chest pain, palpitations, orthopnea and leg swelling.  Gastrointestinal: Positive for abdominal pain.  Negative for constipation, diarrhea, heartburn, nausea and vomiting.  Genitourinary: Positive for dysuria, flank pain and hematuria.  Musculoskeletal: Negative for back pain and joint pain.  Skin: Negative for rash.  Neurological: Positive for weakness. Negative for sensory change, speech change, focal weakness and headaches.  Endo/Heme/Allergies: Does not bruise/bleed easily.  Psychiatric/Behavioral: Negative for depression. The patient is not nervous/anxious.     MEDICATIONS AT HOME:   Prior to Admission medications   Medication Sig Start Date End Date Taking? Authorizing Provider  acetaminophen (TYLENOL) 325 MG tablet Take 325 mg by mouth every 8 (eight) hours as needed.   Yes [provider]  bimatoprost (LUMIGAN) 0.01 % SOLN Place 1 drop into both eyes at bedtime.   Yes [provider]  carbamazepine (TEGRETOL XR) 200 MG 12 hr tablet Take 200 mg by mouth 2 (two) times daily.   Yes [provider]  cholecalciferol (VITAMIN D) 1000 UNITS tablet Take 1,000 Units by mouth daily.   Yes [provider]  cyanocobalamin (,VITAMIN B-12,) 1000 MCG/ML injection Inject 1,000 mcg into the muscle every 30 (thirty) days.   Yes [provider]  pantoprazole (PROTONIX) 40 MG tablet TAKE 1 TABLET EVERY DAY 10/08/16  Yes Lucilla Lame, MD  QUEtiapine (SEROQUEL) 50 MG tablet Take 1 tablet (50 mg total) by mouth at bedtime. She takes two 25mg  tabs at bedtime 10/06/16  Yes Gladstone Lighter, MD  timolol (TIMOPTIC) 0.5 % ophthalmic solution Place 1 drop into both eyes at bedtime.    Yes [provider]  verapamil (CALAN-SR) 240 MG CR tablet Take 240 mg by mouth every evening.    Yes [provider]  docusate sodium (COLACE) 100 MG capsule Take 1 capsule (100 mg total) by mouth 2 (two) times daily as needed for mild constipation. Patient not taking: Reported on 12/18/2016 10/12/16   Dustin Flock, MD  oxyCODONE-acetaminophen (PERCOCET/ROXICET) 5-325  MG tablet Take 1 tablet by mouth every 6 (six) hours as needed for moderate pain or severe pain. Patient not taking: Reported on 12/18/2016 10/12/16   Dustin Flock, MD  traMADol (ULTRAM) 50 MG tablet Take 1 tablet (50 mg total) by mouth every 6 (six) hours as needed for moderate pain or severe pain. Patient not taking: Reported on 10/09/2016 10/06/16   Gladstone Lighter, MD     VITAL SIGNS:  Blood pressure (!) 168/51, pulse 66, temperature 98 F (36.7 C), temperature source Oral, resp. rate 16, height 5\' 3"  (1.6 m), weight 80.7 kg (178 lb), SpO2 98 %.  PHYSICAL EXAMINATION:  Physical Exam  GENERAL:  71 y.o.-year-old patient lying in the bed with no acute distress.  EYES: Pupils equal, round, reactive to light and accommodation. No scleral icterus. Extraocular muscles intact.  HEENT: Head atraumatic, normocephalic. Oropharynx and nasopharynx clear. No oropharyngeal erythema, moist oral mucosa  NECK:  Supple, no jugular venous distention. No thyroid enlargement, no tenderness.  LUNGS: Normal breath sounds bilaterally, no wheezing, rales,  rhonchi. No use of accessory muscles of respiration.  CARDIOVASCULAR: S1, S2 normal. No murmurs, rubs, or gallops.  ABDOMEN: Soft, nontender, nondistended. Bowel sounds present. No organomegaly or mass.  EXTREMITIES: No pedal edema, cyanosis, or clubbing. + 2 pedal & radial pulses b/l.   NEUROLOGIC: Cranial nerves II through XII are intact. No focal Motor or sensory deficits appreciated b/l PSYCHIATRIC: The patient is alert and oriented x 3. Good affect.  SKIN: No obvious rash, lesion, or ulcer.   LABORATORY PANEL:   CBC  Recent Labs Lab 12/18/16 1624  WBC 6.5  HGB 10.1*  HCT 30.3*  PLT 318   ------------------------------------------------------------------------------------------------------------------  Chemistries   Recent Labs Lab 12/18/16 1624  NA 139  K 4.4  CL 104  CO2 26  GLUCOSE 110*  BUN 42*  CREATININE 3.81*  CALCIUM 9.0   AST 12*  ALT 7*  ALKPHOS 39  BILITOT 0.5   ------------------------------------------------------------------------------------------------------------------  Cardiac Enzymes No results for input(s): TROPONINI in the last 168 hours. ------------------------------------------------------------------------------------------------------------------  RADIOLOGY:  Ct Renal Stone Study  Result Date: 12/18/2016 CLINICAL DATA:  Left side abdominal pain and hematuria. EXAM: CT ABDOMEN AND PELVIS WITHOUT CONTRAST TECHNIQUE: Multidetector CT imaging of the abdomen and pelvis was performed following the standard protocol without IV contrast. COMPARISON:  10/05/2016 FINDINGS: Lower chest:  Unremarkable. Hepatobiliary: Stable 7 mm hypodensity posterior right liver, likely a cyst. Similar other scattered probable cysts are unchanged. Gallbladder surgically absent. No intrahepatic or extrahepatic biliary dilation. Pancreas: No focal mass lesion. No dilatation of the main duct. No intraparenchymal cyst. No peripancreatic edema. Spleen: No splenomegaly. No focal mass lesion. Adrenals/Urinary Tract: No adrenal nodule or mass. Innumerable cysts of varying attenuation are identified in the kidneys bilaterally. 3.6 cm lesion towards the lower pole right kidney shows interval increase and nonhomogeneous increased attenuation suggesting interval hemorrhage 4 cm apparent hemorrhagic lesion lower pole right kidney seen on the previous study has decreased in the interval. 19 mm hyperattenuating lesion in the upper pole the left kidney (image 30 series 2) is stable since the most recent comparison study and is also stable in size but increased in attenuation when comparing back to 05/10/2014 suggesting interval intralesional hemorrhage. No renal or ureteral stones. No bladder stones. Stomach/Bowel: Stomach is nondistended. No gastric wall thickening. No evidence of outlet obstruction. Duodenum is normally positioned as is the  ligament of [place]. No small bowel wall thickening. No small bowel dilatation. The terminal ileum is normal. The appendix is not visualized, but there is no edema or inflammation in the region of the cecum. Diverticuli are seen scattered along the entire length of the colon without CT findings of diverticulitis. Vascular/Lymphatic: There is abdominal aortic atherosclerosis without aneurysm. There is no gastrohepatic or hepatoduodenal ligament lymphadenopathy. No intraperitoneal or retroperitoneal lymphadenopathy. No pelvic sidewall lymphadenopathy. Reproductive: Uterus surgically absent. Stable appearance small left ovarian cyst. Other: No intraperitoneal free fluid. Musculoskeletal: Bone windows reveal no worrisome lytic or sclerotic osseous lesions. IMPRESSION: 1. Innumerable cystic lesions in the kidneys bilaterally of varying attenuation. 3.6 cm lesion in the right kidney shows interval development of heterogeneous high attenuation suggesting hemorrhage. Hemorrhagic lesion seen on the most recent comparison study has resolved in the interval. 2. Hepatic cysts. 3. Abdominal aortic atherosclerosis. Electronically Signed   By: Misty Stanley M.D.   On: 12/18/2016 18:11   IMPRESSION AND PLAN:   * Hematuria likely due to polycystic kidney disease and the UTI. Will monitor hemoglobin. White any anticoagulation. Start ciprofloxacin and IV fluids. Consult nephrology.  Patient will be under observation. Likely discharge tomorrow if hemoglobin stable. Pain medications as needed  * Hypertension. Continue home medications. Added IV hydralazine when necessary.  * CKD stage IV. No signs of fluid overload or uremia. No need for immediate dialysis. Nephrology has been consulted. Repeat labs in the morning.  * DVT prophylaxis SCDs  All the records are reviewed and case discussed with ED provider. Management plans discussed with the patient, family and they are in agreement.  CODE STATUS: FULL CODE  TOTAL TIME  TAKING CARE OF THIS PATIENT: 40 minutes.   Hillary Bow R M.D on 12/18/2016 at 6:45 PM  Between 7am to 6pm - Pager - [phone]  After 6pm go to [email or web address] - password [email or web address] [facility]  Office  [phone]  CC: Primary care physician; Cletis Athens, MD  Note: This dictation was prepared with Dragon dictation along with smaller phrase technology. Any transcriptional errors that result from this process are unintentional.

## 2016-12-18 NOTE — ED Triage Notes (Signed)
Pt comes into the ED via POV c/o hematuria that has been going on for a couple of days.  Patient has history of "my kidney hemorrhaging".  Patient states she saw spoke with her kidney specialist but he had left the office.  Patient states she has abdominal pain on the left side that feels as though it is cramping.  Patient able to ambulate well into triage and is in NAD at this time with even and unlabored respirations.

## 2016-12-18 NOTE — ED Provider Notes (Signed)
[facility] Emergency Department Provider Note  ____________________________________________   First MD Initiated Contact with Patient 12/18/16 1711     (approximate)  I have reviewed the triage vital signs and the nursing notes.   HISTORY  Chief Complaint Hematuria    HPI Joyce [name] is a 71 y.o. female with an extensive chronic medical history that includes stage IV or 5 chronic kidney disease not yet on dialysis and polycystic kidney disease.  She has had multiple episodes of hemorrhage of a renal cyst in the past that required admission for observation and hydration.  She presents for the same symptoms.  She reports that the gross hematuria started 3 days ago and has been gradually getting worse.  She saw Dr. Holley Raring this morning who told her she should come back if her symptoms get worse.  Later today the bleeding did get more heavy and she is essentially now urinating what looks like straight blood.  She denies fever/chills, chest pain, shortness of breath, nausea, vomiting.  She does have moderate to severe abdominal pain that is aching located mostly on the right side of her abdomen.  It feels worse with movement and with palpation of the abdomen.   Past Medical History:  Diagnosis Date  . Anal fissure   . Bipolar affective disorder ([place])   . CKD (chronic kidney disease)    Dr Holley Raring [name] 4  . Colon polyps   . Diverticulitis   . Diverticulitis   . Family history of adverse reaction to anesthesia    mom - PONV  . GERD (gastroesophageal reflux disease)   . Headache    migraines - none over 10 yrs  . Heart murmur   . History of hiatal hernia   . Hypertension   . Pancreatitis    Valproic acid  . Vertigo     Patient Active Problem List   Diagnosis Date Noted  . Hematuria 12/18/2016  . Polycystic kidney 10/12/2016  . Ruptured cyst of kidney 10/03/2016  . HTN (hypertension) 10/01/2016  . Chronic kidney disease 10/01/2016  . Anemia  associated with chronic renal failure 09/24/2016  . Acute kidney insufficiency 07/28/2016  . Noninfectious diarrhea   . Benign neoplasm of cecum   . Benign neoplasm of ascending colon   . Diarrhea   . Nausea   . Gastric polyp   . Chronic constipation 12/27/2014  . Nausea with vomiting 12/27/2014  . Left sided abdominal pain 12/27/2014  . Personal history of colonic polyps 05/10/2013    Past Surgical History:  Procedure Laterality Date  . ABDOMINAL HYSTERECTOMY  1990 ?  . AV FISTULA PLACEMENT  4/30  . BREAST EXCISIONAL BIOPSY Left 1994   neg surgical bx  . CHOLECYSTECTOMY  2003  . COLONOSCOPY  2014   Dr. Jamal Collin  . COLONOSCOPY WITH PROPOFOL N/A 09/24/2015   Procedure: COLONOSCOPY WITH random colon byopies.;  Surgeon: Lucilla Lame, MD;  Location: [place];  Service: Endoscopy;  Laterality: N/A;  . ESOPHAGOGASTRODUODENOSCOPY (EGD) WITH PROPOFOL N/A 09/24/2015   Procedure: ESOPHAGOGASTRODUODENOSCOPY (EGD) ;  Surgeon: Lucilla Lame, MD;  Location: [place];  Service: Endoscopy;  Laterality: N/A;  . EYE SURGERY    . PERIPHERAL VASCULAR CATHETERIZATION N/A 05/29/2015   Procedure: A/V Shuntogram/Fistulagram;  Surgeon: Katha Cabal, MD;  Location: [place] CV LAB;  Service: Cardiovascular;  Laterality: N/A;  . PERIPHERAL VASCULAR CATHETERIZATION N/A 05/29/2015   Procedure: A/V Shunt Intervention;  Surgeon: Katha Cabal, MD;  Location: [facility]  INVASIVE CV LAB;  Service: Cardiovascular;  Laterality: N/A;  . POLYPECTOMY  09/24/2015   Procedure: POLYPECTOMY INTESTINAL;  Surgeon: Lucilla Lame, MD;  Location: [place];  Service: Endoscopy;;  cecal polyp ascending polyp    Prior to Admission medications   Medication Sig Start Date End Date Taking? Authorizing Provider  acetaminophen (TYLENOL) 325 MG tablet Take 325 mg by mouth every 8 (eight) hours as needed.   Yes [provider]  bimatoprost (LUMIGAN) 0.01 % SOLN Place 1 drop into both eyes at  bedtime.   Yes [provider]  carbamazepine (TEGRETOL XR) 200 MG 12 hr tablet Take 200 mg by mouth 2 (two) times daily.   Yes [provider]  cholecalciferol (VITAMIN D) 1000 UNITS tablet Take 1,000 Units by mouth daily.   Yes [provider]  cyanocobalamin (,VITAMIN B-12,) 1000 MCG/ML injection Inject 1,000 mcg into the muscle every 30 (thirty) days.   Yes [provider]  pantoprazole (PROTONIX) 40 MG tablet TAKE 1 TABLET EVERY DAY 10/08/16  Yes Lucilla Lame, MD  QUEtiapine (SEROQUEL) 50 MG tablet Take 1 tablet (50 mg total) by mouth at bedtime. She takes two 25mg  tabs at bedtime 10/06/16  Yes Gladstone Lighter, MD  timolol (TIMOPTIC) 0.5 % ophthalmic solution Place 1 drop into both eyes at bedtime.    Yes [provider]  verapamil (CALAN-SR) 240 MG CR tablet Take 240 mg by mouth every evening.    Yes [provider]  docusate sodium (COLACE) 100 MG capsule Take 1 capsule (100 mg total) by mouth 2 (two) times daily as needed for mild constipation. Patient not taking: Reported on 12/18/2016 10/12/16   Dustin Flock, MD  oxyCODONE-acetaminophen (PERCOCET/ROXICET) 5-325 MG tablet Take 1 tablet by mouth every 6 (six) hours as needed for moderate pain or severe pain. Patient not taking: Reported on 12/18/2016 10/12/16   Dustin Flock, MD  traMADol (ULTRAM) 50 MG tablet Take 1 tablet (50 mg total) by mouth every 6 (six) hours as needed for moderate pain or severe pain. Patient not taking: Reported on 10/09/2016 10/06/16   Gladstone Lighter, MD    Allergies Indomethacin; Morphine and related; and Pollen extract  Family History  Problem Relation Age of Onset  . Stroke Father   . Hypertension Father   . Breast cancer Other   . Colon cancer Neg Hx   . Liver disease Neg Hx     Social History Social History  Substance Use Topics  . Smoking status: Never Smoker  . Smokeless tobacco: Never Used  . Alcohol use No    Review of  Systems Constitutional: No fever/chills Eyes: No visual changes. ENT: No sore throat. Cardiovascular: Denies chest pain. Respiratory: Denies shortness of breath. Gastrointestinal: Generalized abdominal tenderness worse on the right side.  Denies nausea and vomiting Genitourinary: Negative for dysuria. Musculoskeletal: Negative for neck pain.  Negative for back pain. Integumentary: Negative for rash. Neurological: Negative for headaches, focal weakness or numbness.   ____________________________________________   PHYSICAL EXAM:  VITAL SIGNS: ED Triage Vitals [12/18/16 1620]  Enc Vitals Group     BP (!) 152/65     Pulse Rate 67     Resp 16     Temp 98 F (36.7 C)     Temp Source Oral     SpO2 98 %     Weight 80.7 kg (178 lb)     Height 1.6 m (5\' 3" )     Head Circumference      Peak  Flow      Pain Score 9     Pain Loc      Pain Edu?      Excl. in [place]?     Constitutional: Alert and oriented. Well appearing and in no acute distress. Eyes: Conjunctivae are normal.  Head: Atraumatic. Nose: No congestion/rhinnorhea. Mouth/Throat: Mucous membranes are moist. Neck: No stridor.  No meningeal signs.   Cardiovascular: Normal rate, regular rhythm. Good peripheral circulation. Grossly normal heart sounds. Respiratory: Normal respiratory effort.  No retractions. Lungs CTAB. Gastrointestinal: Soft with generalized tenderness of the abdomen with no rebound or guarding and no evidence of peritonitis Musculoskeletal: No lower extremity tenderness nor edema. No gross deformities of extremities. Neurologic:  Normal speech and language. No gross focal neurologic deficits are appreciated.  Skin:  Skin is warm, dry and intact. No rash noted. Psychiatric: Mood and affect are normal. Speech and behavior are normal.  ____________________________________________   LABS (all labs ordered are listed, but only abnormal results are displayed)  Labs Reviewed  URINALYSIS, COMPLETE (UACMP) WITH  MICROSCOPIC - Abnormal; Notable for the following:       Result Value   Color, Urine RED (*)    APPearance CLOUDY (*)    Glucose, UA   (*)    Value: TEST NOT REPORTED DUE TO COLOR INTERFERENCE OF URINE PIGMENT   Hgb urine dipstick   (*)    Value: TEST NOT REPORTED DUE TO COLOR INTERFERENCE OF URINE PIGMENT   Bilirubin Urine   (*)    Value: TEST NOT REPORTED DUE TO COLOR INTERFERENCE OF URINE PIGMENT   Ketones, ur   (*)    Value: TEST NOT REPORTED DUE TO COLOR INTERFERENCE OF URINE PIGMENT   Protein, ur   (*)    Value: TEST NOT REPORTED DUE TO COLOR INTERFERENCE OF URINE PIGMENT   Nitrite   (*)    Value: TEST NOT REPORTED DUE TO COLOR INTERFERENCE OF URINE PIGMENT   Leukocytes, UA   (*)    Value: TEST NOT REPORTED DUE TO COLOR INTERFERENCE OF URINE PIGMENT   All other components within normal limits  CBC - Abnormal; Notable for the following:    RBC 3.33 (*)    Hemoglobin 10.1 (*)    HCT 30.3 (*)    All other components within normal limits  BASIC METABOLIC PANEL - Abnormal; Notable for the following:    Glucose, Bld 110 (*)    BUN 42 (*)    Creatinine, Ser 3.81 (*)    GFR calc non Af Amer 11 (*)    GFR calc Af Amer 13 (*)    All other components within normal limits  HEPATIC FUNCTION PANEL - Abnormal; Notable for the following:    AST 12 (*)    ALT 7 (*)    Bilirubin, Direct <0.1 (*)    All other components within normal limits  BASIC METABOLIC PANEL  CBC  TYPE AND SCREEN   ____________________________________________  EKG  None - EKG not ordered by ED physician ____________________________________________  RADIOLOGY   Ct Renal Stone Study  Result Date: 12/18/2016 CLINICAL DATA:  Left side abdominal pain and hematuria. EXAM: CT ABDOMEN AND PELVIS WITHOUT CONTRAST TECHNIQUE: Multidetector CT imaging of the abdomen and pelvis was performed following the standard protocol without IV contrast. COMPARISON:  10/05/2016 FINDINGS: Lower chest:  Unremarkable. Hepatobiliary:  Stable 7 mm hypodensity posterior right liver, likely a cyst. Similar other scattered probable cysts are unchanged. Gallbladder surgically absent. No intrahepatic or extrahepatic  biliary dilation. Pancreas: No focal mass lesion. No dilatation of the main duct. No intraparenchymal cyst. No peripancreatic edema. Spleen: No splenomegaly. No focal mass lesion. Adrenals/Urinary Tract: No adrenal nodule or mass. Innumerable cysts of varying attenuation are identified in the kidneys bilaterally. 3.6 cm lesion towards the lower pole right kidney shows interval increase and nonhomogeneous increased attenuation suggesting interval hemorrhage 4 cm apparent hemorrhagic lesion lower pole right kidney seen on the previous study has decreased in the interval. 19 mm hyperattenuating lesion in the upper pole the left kidney (image 30 series 2) is stable since the most recent comparison study and is also stable in size but increased in attenuation when comparing back to 05/10/2014 suggesting interval intralesional hemorrhage. No renal or ureteral stones. No bladder stones. Stomach/Bowel: Stomach is nondistended. No gastric wall thickening. No evidence of outlet obstruction. Duodenum is normally positioned as is the ligament of Treitz. No small bowel wall thickening. No small bowel dilatation. The terminal ileum is normal. The appendix is not visualized, but there is no edema or inflammation in the region of the cecum. Diverticuli are seen scattered along the entire length of the colon without CT findings of diverticulitis. Vascular/Lymphatic: There is abdominal aortic atherosclerosis without aneurysm. There is no gastrohepatic or hepatoduodenal ligament lymphadenopathy. No intraperitoneal or retroperitoneal lymphadenopathy. No pelvic sidewall lymphadenopathy. Reproductive: Uterus surgically absent. Stable appearance small left ovarian cyst. Other: No intraperitoneal free fluid. Musculoskeletal: Bone windows reveal no worrisome lytic  or sclerotic osseous lesions. IMPRESSION: 1. Innumerable cystic lesions in the kidneys bilaterally of varying attenuation. 3.6 cm lesion in the right kidney shows interval development of heterogeneous high attenuation suggesting hemorrhage. Hemorrhagic lesion seen on the most recent comparison study has resolved in the interval. 2. Hepatic cysts. 3. Abdominal aortic atherosclerosis. Electronically Signed   By: Misty Stanley M.D.   On: 12/18/2016 18:11    ____________________________________________   PROCEDURES  Critical Care performed: No   Procedure(s) performed:   Procedures   ____________________________________________   INITIAL IMPRESSION / ASSESSMENT AND PLAN / ED COURSE  Pertinent labs & imaging results that were available during my care of the patient were reviewed by me and considered in my medical decision making (see chart for details).     Clinical Course as of Dec 18 1945  Thu Dec 18, 2016  1842 (Note that documentation was delayed due to multiple ED patients requiring immediate care.)  I spoke by phone with Dr. Candiss Norse and we discussed the case.  He recommended starting Cipro 500 mg by mouth daily for antibiotic prophylaxis, providing fluid hydration, and observe the patient in the hospital for serial H&H and pain management.  The patient does not want any IV medications because she is very sensitive to morphine and she takes 1 tablet of Percocet by mouth every 6 hours when necessary, so I ordered a Percocet for her.  She is hemodynamically stable.  CT scan did not show any emergent findings that would require surgery.  I have discussed the case by phone with Dr. Darvin Neighbours who will admit.  [CF]    Clinical Course User Index [CF] Hinda Kehr, MD    ____________________________________________  FINAL CLINICAL IMPRESSION(S) / ED DIAGNOSES  Final diagnoses:  Gross hematuria  Stage 5 chronic kidney disease not on chronic dialysis [facility])     MEDICATIONS GIVEN DURING  THIS VISIT:  Medications  ciprofloxacin (CIPRO) tablet 500 mg (500 mg Oral Given 12/18/16 1815)  0.9 %  sodium chloride infusion (not administered)  acetaminophen (TYLENOL)  tablet 650 mg (not administered)    Or  acetaminophen (TYLENOL) suppository 650 mg (not administered)  HYDROcodone-acetaminophen (NORCO/VICODIN) 5-325 MG per tablet 1-2 tablet (not administered)  polyethylene glycol (MIRALAX / GLYCOLAX) packet 17 g (not administered)  ondansetron (ZOFRAN) tablet 4 mg (not administered)    Or  ondansetron (ZOFRAN) injection 4 mg (not administered)  albuterol (PROVENTIL) (2.5 MG/3ML) 0.083% nebulizer solution 2.5 mg (not administered)  hydrALAZINE (APRESOLINE) injection 10 mg (not administered)  sodium chloride 0.9 % bolus 500 mL (0 mLs Intravenous Stopped 12/18/16 1927)  oxyCODONE-acetaminophen (PERCOCET/ROXICET) 5-325 MG per tablet 1 tablet (1 tablet Oral Given 12/18/16 1816)     NEW OUTPATIENT MEDICATIONS STARTED DURING THIS VISIT:  New Prescriptions   No medications on file    Modified Medications   No medications on file    Discontinued Medications   BRIMONIDINE (ALPHAGAN) 0.2 % OPHTHALMIC SOLUTION    Place 1 drop into both eyes 2 (two) times daily.    CALCITRIOL (ROCALTROL) 0.25 MCG CAPSULE    Take 0.25 mcg by mouth daily.     Note:  This document was prepared using Dragon voice recognition software and may include unintentional dictation errors.    Hinda Kehr, MD 12/18/16 [phone]

## 2016-12-19 ENCOUNTER — Other Ambulatory Visit: Payer: Medicare HMO

## 2016-12-19 ENCOUNTER — Ambulatory Visit: Payer: Medicare HMO | Admitting: Hematology and Oncology

## 2016-12-19 ENCOUNTER — Ambulatory Visit: Payer: Medicare HMO

## 2016-12-19 DIAGNOSIS — I10 Essential (primary) hypertension: Secondary | ICD-10-CM | POA: Diagnosis not present

## 2016-12-19 DIAGNOSIS — R319 Hematuria, unspecified: Secondary | ICD-10-CM | POA: Diagnosis not present

## 2016-12-19 DIAGNOSIS — N390 Urinary tract infection, site not specified: Secondary | ICD-10-CM | POA: Diagnosis not present

## 2016-12-19 DIAGNOSIS — N185 Chronic kidney disease, stage 5: Secondary | ICD-10-CM | POA: Diagnosis not present

## 2016-12-19 DIAGNOSIS — Q613 Polycystic kidney, unspecified: Secondary | ICD-10-CM | POA: Diagnosis not present

## 2016-12-19 LAB — CBC
HCT: 27.2 % — ABNORMAL LOW (ref 35.0–47.0)
Hemoglobin: 9.1 g/dL — ABNORMAL LOW (ref 12.0–16.0)
MCH: 30.8 pg (ref 26.0–34.0)
MCHC: 33.4 g/dL (ref 32.0–36.0)
MCV: 92.1 fL (ref 80.0–100.0)
Platelets: 238 10*3/uL (ref 150–440)
RBC: 2.95 MIL/uL — ABNORMAL LOW (ref 3.80–5.20)
RDW: 14.1 % (ref 11.5–14.5)
WBC: 4.5 10*3/uL (ref 3.6–11.0)

## 2016-12-19 LAB — BASIC METABOLIC PANEL
Anion gap: 6 (ref 5–15)
BUN: 41 mg/dL — AB (ref 6–20)
CHLORIDE: 108 mmol/L (ref 101–111)
CO2: 26 mmol/L (ref 22–32)
Calcium: 8.3 mg/dL — ABNORMAL LOW (ref 8.9–10.3)
Creatinine, Ser: 3.52 mg/dL — ABNORMAL HIGH (ref 0.44–1.00)
GFR calc Af Amer: 14 mL/min — ABNORMAL LOW (ref >60)
GFR calc non Af Amer: 12 mL/min — ABNORMAL LOW (ref >60)
GLUCOSE: 95 mg/dL (ref 65–99)
POTASSIUM: 4.1 mmol/L (ref 3.5–5.1)
SODIUM: 140 mmol/L (ref 135–145)

## 2016-12-19 MED ORDER — LATANOPROST 0.005 % OP SOLN
1.0000 [drp] | Freq: Every day | OPHTHALMIC | Status: DC
  Filled 2016-12-19: qty 2.5

## 2016-12-19 MED ORDER — OXYCODONE-ACETAMINOPHEN 5-325 MG PO TABS
1.0000 | ORAL_TABLET | ORAL | Status: DC | PRN
  Administered 2016-12-19: 1 via ORAL
  Filled 2016-12-19: qty 1

## 2016-12-19 MED ORDER — DORZOLAMIDE HCL-TIMOLOL MAL 2-0.5 % OP SOLN
1.0000 [drp] | Freq: Two times a day (BID) | OPHTHALMIC | Status: DC
  Administered 2016-12-20: 1 [drp] via OPHTHALMIC
  Filled 2016-12-19: qty 10

## 2016-12-19 NOTE — Progress Notes (Signed)
[facility] [facility] [place] [facility] [place] NAME: Joyce [name]    MR#:  [id number]  DATE OF BIRTH:  1945-10-30  SUBJECTIVE: Patient is seen at bedside, admitted for hematuria, cyst rupture in a patient with  polycystic kidney disease. Complains of some abdominal cramps mainly on the left side and still has hematuria.  CHIEF COMPLAINT:   Chief Complaint  Patient presents with  . Hematuria    REVIEW OF SYSTEMS:    Review of Systems  Constitutional: Negative for chills and fever.  HENT: Negative for hearing loss.   Eyes: Negative for blurred vision, double vision and photophobia.  Respiratory: Negative for cough, hemoptysis and shortness of breath.   Cardiovascular: Negative for palpitations, orthopnea and leg swelling.  Gastrointestinal: Negative for abdominal pain, diarrhea and vomiting.  Genitourinary: Positive for flank pain and hematuria. Negative for dysuria and urgency.  Musculoskeletal: Negative for myalgias and neck pain.  Skin: Negative for rash.  Neurological: Negative for dizziness, focal weakness, seizures, weakness and headaches.  Psychiatric/Behavioral: Negative for memory loss. The patient does not have insomnia.     Nutrition: Tolerating Diet: Tolerating PT:      DRUG ALLERGIES:   Allergies  Allergen Reactions  . Indomethacin Hives  . Morphine And Related   . Pollen Extract Other (See Comments)    Sinus problems and HA    VITALS:  Blood pressure (!) 148/68, pulse 69, temperature 97.9 F (36.6 C), temperature source Oral, resp. rate 17, height 5\' 4"  (1.626 m), weight 80.4 kg (177 lb 4.8 oz), SpO2 99 %.  PHYSICAL EXAMINATION:   Physical Exam  GENERAL:  71 y.o.-year-old patient lying in the bed with no acute distress.  EYES: Pupils equal, round, reactive to light and accommodation. No scleral icterus. Extraocular muscles intact.  HEENT: Head atraumatic, normocephalic. Oropharynx and nasopharynx clear.  NECK:  Supple, no  jugular venous distention. No thyroid enlargement, no tenderness.  LUNGS: Normal breath sounds bilaterally, no wheezing, rales,rhonchi or crepitation. No use of accessory muscles of respiration.  CARDIOVASCULAR: S1, S2 normal. No murmurs, rubs, or gallops.  ABDOMEN: Soft, nontender, nondistended. Bowel sounds present. No organomegaly or mass. Slight tenderness in the left lower quadrant. EXTREMITIES: No pedal edema, cyanosis, or clubbing.  NEUROLOGIC: Cranial nerves II through XII are intact. Muscle strength 5/5 in all extremities. Sensation intact. Gait not checked.  PSYCHIATRIC: The patient is alert and oriented x 3.  SKIN: No obvious rash, lesion, or ulcer.    LABORATORY PANEL:   CBC  Recent Labs Lab 12/19/16 0423  WBC 4.5  HGB 9.1*  HCT 27.2*  PLT 238   ------------------------------------------------------------------------------------------------------------------  Chemistries   Recent Labs Lab 12/18/16 1624 12/19/16 0423  NA 139 140  K 4.4 4.1  CL 104 108  CO2 26 26  GLUCOSE 110* 95  BUN 42* 41*  CREATININE 3.81* 3.52*  CALCIUM 9.0 8.3*  AST 12*  --   ALT 7*  --   ALKPHOS 39  --   BILITOT 0.5  --    ------------------------------------------------------------------------------------------------------------------  Cardiac Enzymes No results for input(s): TROPONINI in the last 168 hours. ------------------------------------------------------------------------------------------------------------------  RADIOLOGY:  Ct Renal Stone Study  Result Date: 12/18/2016 CLINICAL DATA:  Left side abdominal pain and hematuria. EXAM: CT ABDOMEN AND PELVIS WITHOUT CONTRAST TECHNIQUE: Multidetector CT imaging of the abdomen and pelvis was performed following the standard protocol without IV contrast. COMPARISON:  10/05/2016 FINDINGS: Lower chest:  Unremarkable. Hepatobiliary: Stable 7 mm hypodensity posterior right liver, likely a  cyst. Similar other scattered probable cysts  are unchanged. Gallbladder surgically absent. No intrahepatic or extrahepatic biliary dilation. Pancreas: No focal mass lesion. No dilatation of the main duct. No intraparenchymal cyst. No peripancreatic edema. Spleen: No splenomegaly. No focal mass lesion. Adrenals/Urinary Tract: No adrenal nodule or mass. Innumerable cysts of varying attenuation are identified in the kidneys bilaterally. 3.6 cm lesion towards the lower pole right kidney shows interval increase and nonhomogeneous increased attenuation suggesting interval hemorrhage 4 cm apparent hemorrhagic lesion lower pole right kidney seen on the previous study has decreased in the interval. 19 mm hyperattenuating lesion in the upper pole the left kidney (image 30 series 2) is stable since the most recent comparison study and is also stable in size but increased in attenuation when comparing back to 05/10/2014 suggesting interval intralesional hemorrhage. No renal or ureteral stones. No bladder stones. Stomach/Bowel: Stomach is nondistended. No gastric wall thickening. No evidence of outlet obstruction. Duodenum is normally positioned as is the ligament of Treitz. No small bowel wall thickening. No small bowel dilatation. The terminal ileum is normal. The appendix is not visualized, but there is no edema or inflammation in the region of the cecum. Diverticuli are seen scattered along the entire length of the colon without CT findings of diverticulitis. Vascular/Lymphatic: There is abdominal aortic atherosclerosis without aneurysm. There is no gastrohepatic or hepatoduodenal ligament lymphadenopathy. No intraperitoneal or retroperitoneal lymphadenopathy. No pelvic sidewall lymphadenopathy. Reproductive: Uterus surgically absent. Stable appearance small left ovarian cyst. Other: No intraperitoneal free fluid. Musculoskeletal: Bone windows reveal no worrisome lytic or sclerotic osseous lesions. IMPRESSION: 1. Innumerable cystic lesions in the kidneys bilaterally of  varying attenuation. 3.6 cm lesion in the right kidney shows interval development of heterogeneous high attenuation suggesting hemorrhage. Hemorrhagic lesion seen on the most recent comparison study has resolved in the interval. 2. Hepatic cysts. 3. Abdominal aortic atherosclerosis. Electronically Signed   By: Misty Stanley M.D.   On: 12/18/2016 18:11     ASSESSMENT AND PLAN:   Active Problems:   Hematuria  1. hematuria likely secondary to cyst rupture, patient has history of polycystic kidney disease and has history of hemorrhage. For another 24 hours for further symptoms, hemodynamic stability. Hemoglobin stable. Spoke with nephro.   #2 chronic kidney disease stage IV: On IV hydration no kidney function is stable. #3 essential hypertension ; controlled.  #4 history of glaucoma: Resume eyedrops. 5. bipolar affective disorder. Continue Tegretol.  All the records are reviewed and case discussed with Care Management/Social Workerr. Management plans discussed with the patient, family and they are in agreement.  CODE STATUS:full  TOTAL TIME TAKING CARE OF THIS PATIENT: minutes.   POSSIBLE D/C IN 1-2 DAYS, DEPENDING ON CLINICAL CONDITION.   Epifanio Lesches M.D on 12/19/2016 at 12:40 PM  Between 7am to 6pm - Pager - [phone]  After 6pm go to [email or web address] - password [email or web address] [facility]  Office  [phone]  CC: Primary care physician; Cletis Athens, MD

## 2016-12-19 NOTE — Care Management Obs Status (Signed)
[place] NOTIFICATION   Patient Details  Name: Joyce [name] MRN: [id number] Date of Birth: 1946/01/28   Medicare Observation Status Notification Given:  Yes    Beverly Sessions, RN 12/19/2016, 2:17 PM

## 2016-12-19 NOTE — Consult Note (Signed)
   [facility] CM Inpatient Consult   12/19/2016  Joyce [name] [facility] [birth year] [id number]   Chart review revealed patient eligible for [place] Management services and post hospital discharge follow up related to a diagnosis of CKD and 4 admits in 6 months. Patient was evaluated for community based chronic disease management services with [facility] care Management Program as a benefit of patient's [facility]  Medicare. Met with the patient, son and DIL at the bedside to explain [place] Management services. Patient endorses her primary care provider to be Dr. Lavera Guise. Patient states she has not had to have dialysis yet, but her Nephrologist has been discussing it with her. Verbal consent obtained. Patient gave [phone]) and [phone]) as the best numbers to reach her. Patient will receive post hospital discharge calls and be evaluated for monthly home visits. [facility] Care Management services does not interfere with or replace any services arranged by the inpatient care management team. RNCM left contact information and [facility] literature at the bedside. Made inpatient RNCM aware that [facility] will be following for care management. For additional questions please contact:   Roshun Klingensmith RN, [facility] Liaison  [phone]) Business Mobile [phone]) Toll free office

## 2016-12-19 NOTE — Progress Notes (Signed)
     Shantella Blubaugh was admitted to the Hospital on 12/18/2016 .please excuse  her  Daughter In Law  from work as he was with her Mother In [place]   Today.  Epifanio Lesches MD

## 2016-12-19 NOTE — Progress Notes (Signed)
     Joyce [name] was admitted to the Hospital on 12/18/2016 .please excuse  her son  Jennings Books from work as he was with his Mother   Today.  Epifanio Lesches MD

## 2016-12-19 NOTE — Progress Notes (Signed)
[facility], [place] 12/19/16  Subjective:   Patient presented to the emergency room for gross hematuria, dark urine, flank pain CT of the abdomen showed ruptured kidney cyst. Patient reports she still has pain in the left flank No shortness of breath Appetite has been poor last several weeks although has not lost weight.  Objective:  Vital signs in last 24 hours:  Temp:  [97.6 F (36.4 C)-98 F (36.7 C)] 97.9 F (36.6 C) (06/01 1200) Pulse Rate:  [65-73] 69 (06/01 1200) Resp:  [14-20] 17 (06/01 1200) BP: (127-175)/(44-111) 148/68 (06/01 1200) SpO2:  [97 %-100 %] 99 % (06/01 1200) Weight:  [80 kg (176 lb 6.4 oz)-80.7 kg (178 lb)] 80.4 kg (177 lb 4.8 oz) (06/01 0442)  Weight change:  Filed Weights   12/18/16 1620 12/18/16 2102 12/19/16 0442  Weight: 80.7 kg (178 lb) 80 kg (176 lb 6.4 oz) 80.4 kg (177 lb 4.8 oz)    Intake/Output:    Intake/Output Summary (Last 24 hours) at 12/19/16 1248 Last data filed at 12/19/16 1153  Gross per 24 hour  Intake           1870.5 ml  Output             1700 ml  Net            170.5 ml     Physical Exam: General: Elderly woman, lying in the bed  HEENT Anicteric, moist oral mucous membranes  Neck supple  Pulm/lungs Normal breathing effort, clear to auscultation  CVS/Heart regular, no rub or gallop  Abdomen:  Soft, mild flank tenderness  Extremities: No peripheral edema  Neurologic: Alert, oriented  Skin: No acute rashes  Access: Left upper arm developed av fistula       Basic Metabolic Panel:   Recent Labs Lab 12/18/16 1624 12/19/16 0423  NA 139 140  K 4.4 4.1  CL 104 108  CO2 26 26  GLUCOSE 110* 95  BUN 42* 41*  CREATININE 3.81* 3.52*  CALCIUM 9.0 8.3*     CBC:  Recent Labs Lab 12/18/16 1624 12/19/16 0423  WBC 6.5 4.5  HGB 10.1* 9.1*  HCT 30.3* 27.2*  MCV 91.1 92.1  PLT 318 238     No results found for: HEPBSAG, HEPBSAB, HEPBIGM    Microbiology:  No results found for  this or any previous visit (from the past 240 hour(s)).  Coagulation Studies: No results for input(s): LABPROT, INR in the last 72 hours.  Urinalysis:  Recent Labs  12/18/16 1624  COLORURINE RED*  LABSPEC 1.011  PHURINE TEST NOT REPORTED DUE TO COLOR INTERFERENCE OF URINE PIGMENT  GLUCOSEU TEST NOT REPORTED DUE TO COLOR INTERFERENCE OF URINE PIGMENT*  HGBUR TEST NOT REPORTED DUE TO COLOR INTERFERENCE OF URINE PIGMENT*  BILIRUBINUR TEST NOT REPORTED DUE TO COLOR INTERFERENCE OF URINE PIGMENT*  KETONESUR TEST NOT REPORTED DUE TO COLOR INTERFERENCE OF URINE PIGMENT*  PROTEINUR TEST NOT REPORTED DUE TO COLOR INTERFERENCE OF URINE PIGMENT*  NITRITE TEST NOT REPORTED DUE TO COLOR INTERFERENCE OF URINE PIGMENT*  LEUKOCYTESUR TEST NOT REPORTED DUE TO COLOR INTERFERENCE OF URINE PIGMENT*      Imaging: Ct Renal Stone Study  Result Date: 12/18/2016 CLINICAL DATA:  Left side abdominal pain and hematuria. EXAM: CT ABDOMEN AND PELVIS WITHOUT CONTRAST TECHNIQUE: Multidetector CT imaging of the abdomen and pelvis was performed following the standard protocol without IV contrast. COMPARISON:  10/05/2016 FINDINGS: Lower chest:  Unremarkable. Hepatobiliary: Stable 7 mm hypodensity posterior right  liver, likely a cyst. Similar other scattered probable cysts are unchanged. Gallbladder surgically absent. No intrahepatic or extrahepatic biliary dilation. Pancreas: No focal mass lesion. No dilatation of the main duct. No intraparenchymal cyst. No peripancreatic edema. Spleen: No splenomegaly. No focal mass lesion. Adrenals/Urinary Tract: No adrenal nodule or mass. Innumerable cysts of varying attenuation are identified in the kidneys bilaterally. 3.6 cm lesion towards the lower pole right kidney shows interval increase and nonhomogeneous increased attenuation suggesting interval hemorrhage 4 cm apparent hemorrhagic lesion lower pole right kidney seen on the previous study has decreased in the interval. 19 mm  hyperattenuating lesion in the upper pole the left kidney (image 30 series 2) is stable since the most recent comparison study and is also stable in size but increased in attenuation when comparing back to 05/10/2014 suggesting interval intralesional hemorrhage. No renal or ureteral stones. No bladder stones. Stomach/Bowel: Stomach is nondistended. No gastric wall thickening. No evidence of outlet obstruction. Duodenum is normally positioned as is the ligament of Treitz. No small bowel wall thickening. No small bowel dilatation. The terminal ileum is normal. The appendix is not visualized, but there is no edema or inflammation in the region of the cecum. Diverticuli are seen scattered along the entire length of the colon without CT findings of diverticulitis. Vascular/Lymphatic: There is abdominal aortic atherosclerosis without aneurysm. There is no gastrohepatic or hepatoduodenal ligament lymphadenopathy. No intraperitoneal or retroperitoneal lymphadenopathy. No pelvic sidewall lymphadenopathy. Reproductive: Uterus surgically absent. Stable appearance small left ovarian cyst. Other: No intraperitoneal free fluid. Musculoskeletal: Bone windows reveal no worrisome lytic or sclerotic osseous lesions. IMPRESSION: 1. Innumerable cystic lesions in the kidneys bilaterally of varying attenuation. 3.6 cm lesion in the right kidney shows interval development of heterogeneous high attenuation suggesting hemorrhage. Hemorrhagic lesion seen on the most recent comparison study has resolved in the interval. 2. Hepatic cysts. 3. Abdominal aortic atherosclerosis. Electronically Signed   By: Misty Stanley M.D.   On: 12/18/2016 18:11     Medications:   . sodium chloride 50 mL/hr at 12/18/16 2003   . carbamazepine  200 mg Oral BID  . ciprofloxacin  500 mg Oral Q breakfast  . latanoprost  1 drop Both Eyes QHS  . pantoprazole  40 mg Oral Daily  . QUEtiapine  50 mg Oral QHS  . timolol  1 drop Both Eyes QHS  . verapamil  240  mg Oral QPM   acetaminophen **OR** acetaminophen, albuterol, docusate sodium, hydrALAZINE, HYDROcodone-acetaminophen, ondansetron **OR** ondansetron (ZOFRAN) IV, polyethylene glycol  Assessment/ Plan:  71 y.o.Caucasian female Female with chronic kidney disease stage V, polycystic kidney disease, proteinuria, hypertension, secondary hyperparathyroidism, anemia, diverticulosis, hypertension, bipolar disorder  1.  Chronic kidney disease stage V 2.  Polycystic kidney disease with cyst rupture leading to pain and hemorrhage requiring pain medications 3.  Hypertension  Plan: Patient has flank pain and hematuria from cyst rupture.  She is getting prophylactic antibiotics with ciprofloxacin.  She is getting IV fluids for hydration.  She felt nauseous with pain medications but no vomiting.  Patient has mild uremic symptoms but is not ready for starting dialysis yet.  Blood pressure is well-controlled. Hemoglobin relatively stable. Recommend observation at least one more day.Monitor hemoglobin and electrolytes    LOS: 0 [facility] 6/1/201812:48 PM  [facility] [place], [place]

## 2016-12-19 NOTE — Progress Notes (Addendum)
Patient said that the norco for pain made her feel funny and nauseated during the night when she took it.  She requested that she have what was given to her in the ED (Percocet).  Dr Vianne Bulls notified and ordered percocet.  Order given for patient to resume home eye drops

## 2016-12-20 DIAGNOSIS — N185 Chronic kidney disease, stage 5: Secondary | ICD-10-CM | POA: Diagnosis not present

## 2016-12-20 DIAGNOSIS — N390 Urinary tract infection, site not specified: Secondary | ICD-10-CM | POA: Diagnosis not present

## 2016-12-20 DIAGNOSIS — R319 Hematuria, unspecified: Secondary | ICD-10-CM | POA: Diagnosis not present

## 2016-12-20 DIAGNOSIS — I10 Essential (primary) hypertension: Secondary | ICD-10-CM | POA: Diagnosis not present

## 2016-12-20 DIAGNOSIS — Q613 Polycystic kidney, unspecified: Secondary | ICD-10-CM | POA: Diagnosis not present

## 2016-12-20 LAB — BASIC METABOLIC PANEL
Anion gap: 7 (ref 5–15)
BUN: 40 mg/dL — ABNORMAL HIGH (ref 6–20)
CHLORIDE: 110 mmol/L (ref 101–111)
CO2: 25 mmol/L (ref 22–32)
Calcium: 8.3 mg/dL — ABNORMAL LOW (ref 8.9–10.3)
Creatinine, Ser: 3.43 mg/dL — ABNORMAL HIGH (ref 0.44–1.00)
GFR calc non Af Amer: 13 mL/min — ABNORMAL LOW (ref >60)
GFR, EST AFRICAN AMERICAN: 15 mL/min — AB (ref >60)
Glucose, Bld: 96 mg/dL (ref 65–99)
POTASSIUM: 4.5 mmol/L (ref 3.5–5.1)
SODIUM: 142 mmol/L (ref 135–145)

## 2016-12-20 LAB — CBC
HCT: 25.2 % — ABNORMAL LOW (ref 35.0–47.0)
HEMOGLOBIN: 8.5 g/dL — AB (ref 12.0–16.0)
MCH: 30.7 pg (ref 26.0–34.0)
MCHC: 33.6 g/dL (ref 32.0–36.0)
MCV: 91.4 fL (ref 80.0–100.0)
Platelets: 214 10*3/uL (ref 150–440)
RBC: 2.76 MIL/uL — AB (ref 3.80–5.20)
RDW: 14.2 % (ref 11.5–14.5)
WBC: 4.6 10*3/uL (ref 3.6–11.0)

## 2016-12-20 MED ORDER — OXYCODONE-ACETAMINOPHEN 5-325 MG PO TABS: 1.0000 | ORAL_TABLET | Freq: Four times a day (QID) | ORAL | 0 refills | Status: DC | PRN

## 2016-12-20 NOTE — Progress Notes (Signed)
[facility], [place] 12/20/16  Subjective:   Patient presented to the emergency room for gross hematuria, dark urine,left flank pain CT of the abdomen showed ruptured kidney cyst. Patient reports  pain in the left flank is much improved; however, she still has blood in urine No shortness of breath Ate good this morning  Objective:  Vital signs in last 24 hours:  Temp:  [97.9 F (36.6 C)-98.3 F (36.8 C)] 98.3 F (36.8 C) (06/02 0616) Pulse Rate:  [66-69] 66 (06/02 0616) Resp:  [17-24] 20 (06/02 0616) BP: (141-151)/(59-68) 141/60 (06/02 0616) SpO2:  [97 %-99 %] 99 % (06/02 0616) Weight:  [83.8 kg (184 lb 11.2 oz)] 83.8 kg (184 lb 11.2 oz) (06/02 0616)  Weight change: 3.039 kg (6 lb 11.2 oz) Filed Weights   12/18/16 2102 12/19/16 0442 12/20/16 0616  Weight: 80 kg (176 lb 6.4 oz) 80.4 kg (177 lb 4.8 oz) 83.8 kg (184 lb 11.2 oz)    Intake/Output:    Intake/Output Summary (Last 24 hours) at 12/20/16 1134 Last data filed at 12/20/16 1012  Gross per 24 hour  Intake          2527.34 ml  Output             2350 ml  Net           177.34 ml     Physical Exam: General: Elderly woman, lying in the bed  HEENT Anicteric, moist oral mucous membranes  Neck supple  Pulm/lungs Normal breathing effort, clear to auscultation  CVS/Heart regular, no rub or gallop  Abdomen:  Soft, mild flank tenderness  Extremities: No peripheral edema  Neurologic: Alert, oriented  Skin: No acute rashes  Access: Left upper arm developed av fistula       Basic Metabolic Panel:   Recent Labs Lab 12/18/16 1624 12/19/16 0423 12/20/16 0338  NA 139 140 142  K 4.4 4.1 4.5  CL 104 108 110  CO2 26 26 25   GLUCOSE 110* 95 96  BUN 42* 41* 40*  CREATININE 3.81* 3.52* 3.43*  CALCIUM 9.0 8.3* 8.3*     CBC:  Recent Labs Lab 12/18/16 1624 12/19/16 0423 12/20/16 0338  WBC 6.5 4.5 4.6  HGB 10.1* 9.1* 8.5*  HCT 30.3* 27.2* 25.2*  MCV 91.1 92.1 91.4  PLT 318 238 214      No results found for: HEPBSAG, HEPBSAB, HEPBIGM    Microbiology:  No results found for this or any previous visit (from the past 240 hour(s)).  Coagulation Studies: No results for input(s): LABPROT, INR in the last 72 hours.  Urinalysis:  Recent Labs  12/18/16 1624  COLORURINE RED*  LABSPEC 1.011  PHURINE TEST NOT REPORTED DUE TO COLOR INTERFERENCE OF URINE PIGMENT  GLUCOSEU TEST NOT REPORTED DUE TO COLOR INTERFERENCE OF URINE PIGMENT*  HGBUR TEST NOT REPORTED DUE TO COLOR INTERFERENCE OF URINE PIGMENT*  BILIRUBINUR TEST NOT REPORTED DUE TO COLOR INTERFERENCE OF URINE PIGMENT*  KETONESUR TEST NOT REPORTED DUE TO COLOR INTERFERENCE OF URINE PIGMENT*  PROTEINUR TEST NOT REPORTED DUE TO COLOR INTERFERENCE OF URINE PIGMENT*  NITRITE TEST NOT REPORTED DUE TO COLOR INTERFERENCE OF URINE PIGMENT*  LEUKOCYTESUR TEST NOT REPORTED DUE TO COLOR INTERFERENCE OF URINE PIGMENT*      Imaging: Ct Renal Stone Study  Result Date: 12/18/2016 CLINICAL DATA:  Left side abdominal pain and hematuria. EXAM: CT ABDOMEN AND PELVIS WITHOUT CONTRAST TECHNIQUE: Multidetector CT imaging of the abdomen and pelvis was performed following the standard  protocol without IV contrast. COMPARISON:  10/05/2016 FINDINGS: Lower chest:  Unremarkable. Hepatobiliary: Stable 7 mm hypodensity posterior right liver, likely a cyst. Similar other scattered probable cysts are unchanged. Gallbladder surgically absent. No intrahepatic or extrahepatic biliary dilation. Pancreas: No focal mass lesion. No dilatation of the main duct. No intraparenchymal cyst. No peripancreatic edema. Spleen: No splenomegaly. No focal mass lesion. Adrenals/Urinary Tract: No adrenal nodule or mass. Innumerable cysts of varying attenuation are identified in the kidneys bilaterally. 3.6 cm lesion towards the lower pole right kidney shows interval increase and nonhomogeneous increased attenuation suggesting interval hemorrhage 4 cm apparent hemorrhagic  lesion lower pole right kidney seen on the previous study has decreased in the interval. 19 mm hyperattenuating lesion in the upper pole the left kidney (image 30 series 2) is stable since the most recent comparison study and is also stable in size but increased in attenuation when comparing back to 05/10/2014 suggesting interval intralesional hemorrhage. No renal or ureteral stones. No bladder stones. Stomach/Bowel: Stomach is nondistended. No gastric wall thickening. No evidence of outlet obstruction. Duodenum is normally positioned as is the ligament of Treitz. No small bowel wall thickening. No small bowel dilatation. The terminal ileum is normal. The appendix is not visualized, but there is no edema or inflammation in the region of the cecum. Diverticuli are seen scattered along the entire length of the colon without CT findings of diverticulitis. Vascular/Lymphatic: There is abdominal aortic atherosclerosis without aneurysm. There is no gastrohepatic or hepatoduodenal ligament lymphadenopathy. No intraperitoneal or retroperitoneal lymphadenopathy. No pelvic sidewall lymphadenopathy. Reproductive: Uterus surgically absent. Stable appearance small left ovarian cyst. Other: No intraperitoneal free fluid. Musculoskeletal: Bone windows reveal no worrisome lytic or sclerotic osseous lesions. IMPRESSION: 1. Innumerable cystic lesions in the kidneys bilaterally of varying attenuation. 3.6 cm lesion in the right kidney shows interval development of heterogeneous high attenuation suggesting hemorrhage. Hemorrhagic lesion seen on the most recent comparison study has resolved in the interval. 2. Hepatic cysts. 3. Abdominal aortic atherosclerosis. Electronically Signed   By: Misty Stanley M.D.   On: 12/18/2016 18:11     Medications:    . carbamazepine  200 mg Oral BID  . ciprofloxacin  500 mg Oral Q breakfast  . dorzolamide-timolol  1 drop Both Eyes BID  . latanoprost  1 drop Both Eyes QHS  . latanoprost  1 drop  Both Eyes QHS  . pantoprazole  40 mg Oral Daily  . QUEtiapine  50 mg Oral QHS  . timolol  1 drop Both Eyes QHS  . verapamil  240 mg Oral QPM   acetaminophen **OR** acetaminophen, albuterol, docusate sodium, hydrALAZINE, ondansetron **OR** ondansetron (ZOFRAN) IV, oxyCODONE-acetaminophen, polyethylene glycol  Assessment/ Plan:  71 y.o.Caucasian female Female with chronic kidney disease stage V, polycystic kidney disease, proteinuria, hypertension, secondary hyperparathyroidism, anemia, diverticulosis, hypertension, bipolar disorder  1.  Chronic kidney disease stage V 2.  Polycystic kidney disease with cyst rupture leading to pain and hemorrhage requiring pain medications 3.  Hypertension  Plan: Patient has flank pain and hematuria from cyst rupture.  She is getting prophylactic antibiotics with ciprofloxacin.  Continue for 5-7 days.   Blood pressure is well-controlled. Hemoglobin dropped to 8.5; patient will get CBC and renal panel at Dr Elwyn Lade office on Monday.  Discussed with patient; she feels ready and comfortable to go home.      LOS: 0 [facility] 6/2/201811:34 AM  [facility] [place], [place]

## 2016-12-20 NOTE — Discharge Summary (Signed)
[facility] [place] NAME: Joyce [name]    MR#:  [id number]  DATE OF BIRTH:  1945/08/24  DATE OF ADMISSION:  12/18/2016   ADMITTING PHYSICIAN: Hillary Bow, MD  DATE OF DISCHARGE: 12/20/2016 11:45 AM  PRIMARY CARE PHYSICIAN: Cletis Athens, MD   ADMISSION DIAGNOSIS:  Gross hematuria [R31.0] Stage 5 chronic kidney disease not on chronic dialysis ([place]) [N18.5] DISCHARGE DIAGNOSIS:  Active Problems:   Hematuria  hematuria likely secondary to cyst rupture, SECONDARY DIAGNOSIS:   Past Medical History:  Diagnosis Date  . Anal fissure   . Bipolar affective disorder ([place])   . CKD (chronic kidney disease)    Dr Holley Raring [name] 4  . Colon polyps   . Diverticulitis   . Diverticulitis   . Family history of adverse reaction to anesthesia    mom - PONV  . GERD (gastroesophageal reflux disease)   . Headache    migraines - none over 10 yrs  . Heart murmur   . History of hiatal hernia   . Hypertension   . Pancreatitis    Valproic acid  . Vertigo    HOSPITAL COURSE:   1. hematuria likely secondary to cyst rupture, patient has history of polycystic kidney disease and has history of hemorrhage. No symptoms for another 24 hours for further symptoms, hemodynamic stability. Hemoglobin stable.  #2 chronic kidney disease stage IV: On IV hydration no kidney function is stable. #3 essential hypertension ; controlled.  #4 history of glaucoma: Resume eyedrops. 5. bipolar affective disorder. Continue Tegretol. Discussed with Dr. Candiss Norse. DISCHARGE CONDITIONS:  Stable, discharged to home today. CONSULTS OBTAINED:  Treatment Team:  [name], MD DRUG ALLERGIES:   Allergies  Allergen Reactions  . Indomethacin Hives  . Morphine And Related   . Pollen Extract Other (See Comments)    Sinus problems and HA   DISCHARGE MEDICATIONS:   Allergies as of 12/20/2016      Reactions   Indomethacin Hives   Morphine And Related    Pollen Extract Other  (See Comments)   Sinus problems and HA      Medication List    TAKE these medications   acetaminophen 325 MG tablet Commonly known as:  TYLENOL Take 325 mg by mouth every 8 (eight) hours as needed.   carbamazepine 200 MG 12 hr tablet Commonly known as:  TEGRETOL XR Take 200 mg by mouth 2 (two) times daily.   cholecalciferol 1000 units tablet Commonly known as:  VITAMIN D Take 1,000 Units by mouth daily. Notes to patient:  Take as directed   cyanocobalamin 1000 MCG/ML injection Commonly known as:  (VITAMIN B-12) Inject 1,000 mcg into the muscle every 30 (thirty) days. Notes to patient:  Take as directed   docusate sodium 100 MG capsule Commonly known as:  COLACE Take 1 capsule (100 mg total) by mouth 2 (two) times daily as needed for mild constipation. Notes to patient:  As directed   dorzolamide-timolol 22.3-6.8 MG/ML ophthalmic solution Commonly known as:  COSOPT Place 1 drop into both eyes 2 (two) times daily. 7am and 2pm   latanoprost 0.005 % ophthalmic solution Commonly known as:  XALATAN 1 drop at bedtime.   oxyCODONE-acetaminophen 5-325 MG tablet Commonly known as:  PERCOCET/ROXICET Take 1 tablet by mouth every 6 (six) hours as needed for moderate pain or severe pain.   pantoprazole 40 MG tablet Commonly known as:  PROTONIX TAKE 1 TABLET EVERY DAY   QUEtiapine 50 MG tablet  Commonly known as:  SEROQUEL Take 1 tablet (50 mg total) by mouth at bedtime. She takes two 25mg  tabs at bedtime   timolol 0.5 % ophthalmic solution Commonly known as:  TIMOPTIC Place 1 drop into both eyes 2 (two) times daily. 7am and 2pm   traMADol 50 MG tablet Commonly known as:  ULTRAM Take 1 tablet (50 mg total) by mouth every 6 (six) hours as needed for moderate pain or severe pain.   verapamil 240 MG CR tablet Commonly known as:  CALAN-SR Take 240 mg by mouth every evening.        DISCHARGE INSTRUCTIONS:  See AVS. If you experience worsening of your admission symptoms,  develop shortness of breath, life threatening emergency, suicidal or homicidal thoughts you must seek medical attention immediately by calling 911 or calling your MD immediately  if symptoms less severe.  You Must read complete instructions/literature along with all the possible adverse reactions/side effects for all the Medicines you take and that have been prescribed to you. Take any new Medicines after you have completely understood and accpet all the possible adverse reactions/side effects.   Please note  You were cared for by a hospitalist during your hospital stay. If you have any questions about your discharge medications or the care you received while you were in the hospital after you are discharged, you can call the unit and asked to speak with the hospitalist on call if the hospitalist that took care of you is not available. Once you are discharged, your primary care physician will handle any further medical issues. Please note that NO REFILLS for any discharge medications will be authorized once you are discharged, as it is imperative that you return to your primary care physician (or establish a relationship with a primary care physician if you do not have one) for your aftercare needs so that they can reassess your need for medications and monitor your lab values.    On the day of Discharge:  VITAL SIGNS:  Blood pressure (!) 141/60, pulse 66, temperature 98.3 F (36.8 C), temperature source Oral, resp. rate 20, height 5\' 4"  (1.626 m), weight 184 lb 11.2 oz (83.8 kg), SpO2 99 %. PHYSICAL EXAMINATION:  GENERAL:  71 y.o.-year-old patient lying in the bed with no acute distress.  EYES: Pupils equal, round, reactive to light and accommodation. No scleral icterus. Extraocular muscles intact.  HEENT: Head atraumatic, normocephalic. Oropharynx and nasopharynx clear.  NECK:  Supple, no jugular venous distention. No thyroid enlargement, no tenderness.  LUNGS: Normal breath sounds bilaterally,  no wheezing, rales,rhonchi or crepitation. No use of accessory muscles of respiration.  CARDIOVASCULAR: S1, S2 normal. No murmurs, rubs, or gallops.  ABDOMEN: Soft, non-tender, non-distended. Bowel sounds present. No organomegaly or mass.  EXTREMITIES: No pedal edema, cyanosis, or clubbing.  NEUROLOGIC: Cranial nerves II through XII are intact. Muscle strength 5/5 in all extremities. Sensation intact. Gait not checked.  PSYCHIATRIC: The patient is alert and oriented x 3.  SKIN: No obvious rash, lesion, or ulcer.  DATA REVIEW:   CBC  Recent Labs Lab 12/20/16 0338  WBC 4.6  HGB 8.5*  HCT 25.2*  PLT 214    Chemistries   Recent Labs Lab 12/18/16 1624  12/20/16 0338  NA 139  < > 142  K 4.4  < > 4.5  CL 104  < > 110  CO2 26  < > 25  GLUCOSE 110*  < > 96  BUN 42*  < > 40*  CREATININE 3.81*  < >  3.43*  CALCIUM 9.0  < > 8.3*  AST 12*  --   --   ALT 7*  --   --   ALKPHOS 39  --   --   BILITOT 0.5  --   --   < > = values in this interval not displayed.   Microbiology Results  Results for orders placed or performed during the hospital encounter of 10/10/16  Urine culture     Status: Abnormal   Collection Time: 10/09/16 11:53 AM  Result Value Ref Range Status   Specimen Description URINE, RANDOM  Final   Special Requests NONE  Final   Culture MULTIPLE SPECIES PRESENT, SUGGEST RECOLLECTION (A)  Final   Report Status 10/11/2016 FINAL  Final    RADIOLOGY:  No results found.   Management plans discussed with the patient, family and they are in agreement.  CODE STATUS: Prior   TOTAL TIME TAKING CARE OF THIS PATIENT: 26 minutes.    Demetrios Loll M.D on 12/20/2016 at 4:17 PM  Between 7am to 6pm - Pager - [phone]  After 6pm go to [email or web address] - Proofreader  [facility]  Office  [phone]  CC: Primary care physician; Cletis Athens, MD   Note: This dictation was prepared with Dragon dictation along with smaller phrase  technology. Any transcriptional errors that result from this process are unintentional.

## 2016-12-21 LAB — URINE CULTURE

## 2016-12-22 ENCOUNTER — Encounter: Payer: Self-pay | Admitting: *Deleted

## 2016-12-22 ENCOUNTER — Other Ambulatory Visit: Payer: Self-pay | Admitting: *Deleted

## 2016-12-22 DIAGNOSIS — E559 Vitamin D deficiency, unspecified: Secondary | ICD-10-CM | POA: Diagnosis not present

## 2016-12-22 DIAGNOSIS — N185 Chronic kidney disease, stage 5: Secondary | ICD-10-CM | POA: Diagnosis not present

## 2016-12-22 DIAGNOSIS — I10 Essential (primary) hypertension: Secondary | ICD-10-CM | POA: Diagnosis not present

## 2016-12-22 DIAGNOSIS — Q613 Polycystic kidney, unspecified: Secondary | ICD-10-CM | POA: Diagnosis not present

## 2016-12-22 NOTE — Patient Outreach (Signed)
Transition of care call successful, follow up on referral 6/1 from [facility] hospital liaison- follow up with pt post discharge/recent hospitalization 5/31-6/2 for gross hematuria, likely secondary to cyst rupture.  History includes by not limited to Stage 4 kidney disease- not on dialysis,polycystic kidney disease, hypertension, glaucoma, bipolar affective disorder.  Spoke with pt, HIPAA/identity verified, discussed purpose of call - follow up on referral.  Pt reports pain and bleeding not back to normal yet.  Pt reports to have lab work today, f/u with Primary Care MD in 2 weeks, Kidney MD 6/15.   Pt reports monitoring BP closely, yesterday was 140/60, watching the bottom number as it did drop to 40 in the past.  Pt checked BP during phone call- result was 156/76- per pt takes her BP medication at night.  Pt reports taking all of her medications as ordered, no problems affording- Patient was recently discharged from hospital and all medications have been reviewed. RN CM discussed with pt [facility] transition of care program- follow 31 days (weekly phone calls, a home visit)- no cost, benefit of her insurance.  Pt reports she was told to take off work this week to see if bleeding will stop, plans to go back next week/ daily work schedule  Starts 7 or 8 am to 4 pm, gets home around 4:30 pm so requested call then.   Will pt's work schedule- discussed with pt difficulty doing a home visit so plan to follow up telephonically.  Also discussed with pt, coworker Journalist, [employer] (covering for this RN CM) will do follow up phone call next week.    Plan:  Barrier letter faxed to Dr. Lavera Guise- informing of [facility] involvement.            As discussed with pt, coworker Journalist, [employer] to follow up next week telephonically-              Part of ongoing transition of care.    Zara Chess.   [facility]  [phone]

## 2016-12-26 DIAGNOSIS — N185 Chronic kidney disease, stage 5: Secondary | ICD-10-CM | POA: Diagnosis not present

## 2016-12-26 DIAGNOSIS — Q613 Polycystic kidney, unspecified: Secondary | ICD-10-CM | POA: Diagnosis not present

## 2016-12-26 DIAGNOSIS — I10 Essential (primary) hypertension: Secondary | ICD-10-CM | POA: Diagnosis not present

## 2016-12-26 DIAGNOSIS — E559 Vitamin D deficiency, unspecified: Secondary | ICD-10-CM | POA: Diagnosis not present

## 2016-12-30 ENCOUNTER — Encounter: Payer: Self-pay | Admitting: *Deleted

## 2016-12-30 ENCOUNTER — Other Ambulatory Visit: Payer: Self-pay | Admitting: *Deleted

## 2016-12-30 NOTE — Patient Outreach (Signed)
[facility]) Care Management [place] Telephone Outreach, Transition of Care   12/30/2016  [place] [birth year] [phone]  Unsuccessful telephone outreach to Joyce [name], 71 y/o female followed by [facility] for transition of care after recent hospital visit for gross hematuria.  HIPAA compliant voice mail message left for patient requesting return call back.  In voicemail message left for patient, explained that I was contacting patient for primary [facility] RN CM Kathie Rhodes, and that Rose would re-attempt contact with patient at a later time in week, if I did not hear back from patient before end of business day today.  Plan:  Will update patient's primary [facility] RN CM of today's unsuccessful telephone outreach to patient  Oneta Rack, RN, BSN, [place] Coordinator [facility] Care Management  [phone]

## 2016-12-31 ENCOUNTER — Other Ambulatory Visit: Payer: Self-pay | Admitting: *Deleted

## 2016-12-31 NOTE — Patient Outreach (Signed)
Transition of care call successful- ongoing follow up on recent hospitalization 5/31-6/2 for gross hematuria, likely secondary to cyst rupture.  History includes but not limited to Stage 4 kidney disease- not on dialysis, polycystic kidney disease, hypertension, glaucoma, bipolar affective disorder.  Spoke with pt, HIPAA/identity verified.  Pt reports doing good, went back to work 6/11, had lab work done 6/8 - hemoglobin increased to  9.5, kidney function went from 13 to 10 which she  Cannot understand.   Pt reports she is to see Kidney MD 6/15.   Pt reports no further bleeding or pain, pain comes with bleeding.   Pt reports appetite is good.     Plan:  As discussed with pt, plan to follow up again next week telephonically/home visit not feasible due to pt's work hours.    Zara Chess.   [place] Care Management  [phone]

## 2017-01-02 DIAGNOSIS — N2581 Secondary hyperparathyroidism of renal origin: Secondary | ICD-10-CM | POA: Diagnosis not present

## 2017-01-02 DIAGNOSIS — N185 Chronic kidney disease, stage 5: Secondary | ICD-10-CM | POA: Diagnosis not present

## 2017-01-02 DIAGNOSIS — I10 Essential (primary) hypertension: Secondary | ICD-10-CM | POA: Diagnosis not present

## 2017-01-02 DIAGNOSIS — D631 Anemia in chronic kidney disease: Secondary | ICD-10-CM | POA: Diagnosis not present

## 2017-01-02 DIAGNOSIS — Q613 Polycystic kidney, unspecified: Secondary | ICD-10-CM | POA: Diagnosis not present

## 2017-01-02 DIAGNOSIS — Q612 Polycystic kidney, adult type: Secondary | ICD-10-CM | POA: Diagnosis not present

## 2017-01-05 DIAGNOSIS — H401132 Primary open-angle glaucoma, bilateral, moderate stage: Secondary | ICD-10-CM | POA: Diagnosis not present

## 2017-01-06 ENCOUNTER — Ambulatory Visit: Payer: Medicare HMO | Admitting: *Deleted

## 2017-01-07 ENCOUNTER — Other Ambulatory Visit: Payer: Self-pay | Admitting: *Deleted

## 2017-01-07 NOTE — Patient Outreach (Addendum)
Unsuccessful telephone outreach made to pt as part of ongoing transition of care, follow up on recent hospitalization May 31- December 20, 2016 for gross hematuria,likely secondary to cyst rupture.   HIPAA compliant voice message left with contact name and number.   Plan:  If no response to voice message left, plan to follow up again telephonically within next 7 days.    Zara Chess.   [place] Management  [phone]   Addendum: correction to earlier note-  unable to leave a voice message as phone kept ringing, plan to follow up again within the next 7 days.    Zara Chess.   [place] Care Management  [phone]

## 2017-01-09 ENCOUNTER — Other Ambulatory Visit: Payer: Self-pay | Admitting: *Deleted

## 2017-01-09 NOTE — Patient Outreach (Signed)
Second Unsuccessful telephone outreach to pt Joyce [name] as part of ongoing transition of care, follow up on recent hospitalization May 31-December 20, 2016 for gross hematuria, likely secondary to cyst rupture.  HIPAA compliant voice message left with contact name and number.    Plan:  If no response to voice message left, plan to make third outreach call next week.     Zara Chess.   [place] Care Management  [phone]

## 2017-01-12 ENCOUNTER — Ambulatory Visit: Payer: Medicare HMO | Admitting: *Deleted

## 2017-01-14 ENCOUNTER — Other Ambulatory Visit: Payer: Self-pay | Admitting: *Deleted

## 2017-01-14 NOTE — Patient Outreach (Signed)
Unsuccessful telephone outreach (third) to pt Joyce [name]- part of ongoing transition of care, follow up on recent hospitalization May 31-December 20, 2016 for gross hematuria, likely secondary to cyst rupture.  Unable to leave a voice message as phone kept ringing.     Plan:  RN CM to make one final outreach call tomorrow.     Zara Chess.   [name] Care Management  [phone]

## 2017-01-15 ENCOUNTER — Ambulatory Visit: Payer: Self-pay | Admitting: *Deleted

## 2017-01-15 ENCOUNTER — Other Ambulatory Visit: Payer: Self-pay | Admitting: *Deleted

## 2017-01-15 ENCOUNTER — Encounter: Payer: Self-pay | Admitting: *Deleted

## 2017-01-15 NOTE — Patient Outreach (Signed)
4:34 pm -Unsuccessful telephone outreach (three prior attempts made) to pt Joyce [name] 71 year old female- part of ongoing transition of care, follow up on recent hospitalization May 31-June 2,2018 for gross hematuria, likely secondary to cyst rupture. Unable to leave a voice message as phone rang then disconnected.     4:38 pm- received a return phone call from pt's home number called earlier, no one responded/phone disconnected.     Plan:  Unable to contact letter to be sent to pt, if no response within 10 business days, plan to close case.   Zara Chess.   [facility] Management  [phone]

## 2017-01-22 ENCOUNTER — Ambulatory Visit: Payer: Self-pay | Admitting: *Deleted

## 2017-02-02 ENCOUNTER — Encounter: Payer: Self-pay | Admitting: *Deleted

## 2017-02-06 DIAGNOSIS — I10 Essential (primary) hypertension: Secondary | ICD-10-CM | POA: Diagnosis not present

## 2017-02-06 DIAGNOSIS — N185 Chronic kidney disease, stage 5: Secondary | ICD-10-CM | POA: Diagnosis not present

## 2017-02-06 DIAGNOSIS — E559 Vitamin D deficiency, unspecified: Secondary | ICD-10-CM | POA: Diagnosis not present

## 2017-02-06 DIAGNOSIS — Q613 Polycystic kidney, unspecified: Secondary | ICD-10-CM | POA: Diagnosis not present

## 2017-02-09 ENCOUNTER — Inpatient Hospital Stay: Payer: Medicare HMO

## 2017-02-09 ENCOUNTER — Encounter: Payer: Self-pay | Admitting: *Deleted

## 2017-02-09 ENCOUNTER — Inpatient Hospital Stay
Admission: AD | Admit: 2017-02-09 | Discharge: 2017-02-11 | DRG: 699 | Disposition: A | Discharge status: Home / Self Care | Payer: Medicare HMO | Source: Ambulatory Visit | Attending: Internal Medicine | Admitting: Internal Medicine

## 2017-02-09 DIAGNOSIS — Q6100 Congenital renal cyst, unspecified: Secondary | ICD-10-CM

## 2017-02-09 DIAGNOSIS — K219 Gastro-esophageal reflux disease without esophagitis: Secondary | ICD-10-CM | POA: Diagnosis present

## 2017-02-09 DIAGNOSIS — N184 Chronic kidney disease, stage 4 (severe): Secondary | ICD-10-CM | POA: Diagnosis not present

## 2017-02-09 DIAGNOSIS — Z885 Allergy status to narcotic agent status: Secondary | ICD-10-CM

## 2017-02-09 DIAGNOSIS — N185 Chronic kidney disease, stage 5: Secondary | ICD-10-CM | POA: Diagnosis not present

## 2017-02-09 DIAGNOSIS — N281 Cyst of kidney, acquired: Secondary | ICD-10-CM | POA: Diagnosis not present

## 2017-02-09 DIAGNOSIS — Q613 Polycystic kidney, unspecified: Secondary | ICD-10-CM | POA: Diagnosis not present

## 2017-02-09 DIAGNOSIS — N2581 Secondary hyperparathyroidism of renal origin: Secondary | ICD-10-CM | POA: Diagnosis not present

## 2017-02-09 DIAGNOSIS — H409 Unspecified glaucoma: Secondary | ICD-10-CM | POA: Diagnosis not present

## 2017-02-09 DIAGNOSIS — I10 Essential (primary) hypertension: Secondary | ICD-10-CM | POA: Diagnosis not present

## 2017-02-09 DIAGNOSIS — R319 Hematuria, unspecified: Secondary | ICD-10-CM | POA: Diagnosis not present

## 2017-02-09 DIAGNOSIS — R109 Unspecified abdominal pain: Secondary | ICD-10-CM | POA: Diagnosis not present

## 2017-02-09 DIAGNOSIS — D631 Anemia in chronic kidney disease: Secondary | ICD-10-CM | POA: Diagnosis not present

## 2017-02-09 DIAGNOSIS — Z9049 Acquired absence of other specified parts of digestive tract: Secondary | ICD-10-CM

## 2017-02-09 DIAGNOSIS — F319 Bipolar disorder, unspecified: Secondary | ICD-10-CM | POA: Diagnosis present

## 2017-02-09 DIAGNOSIS — R809 Proteinuria, unspecified: Secondary | ICD-10-CM | POA: Diagnosis not present

## 2017-02-09 DIAGNOSIS — Q612 Polycystic kidney, adult type: Secondary | ICD-10-CM | POA: Diagnosis not present

## 2017-02-09 DIAGNOSIS — Z79899 Other long term (current) drug therapy: Secondary | ICD-10-CM

## 2017-02-09 DIAGNOSIS — Z90710 Acquired absence of both cervix and uterus: Secondary | ICD-10-CM | POA: Diagnosis not present

## 2017-02-09 DIAGNOSIS — I120 Hypertensive chronic kidney disease with stage 5 chronic kidney disease or end stage renal disease: Secondary | ICD-10-CM | POA: Diagnosis not present

## 2017-02-09 MED ORDER — HEPARIN SODIUM (PORCINE) 5000 UNIT/ML IJ SOLN
5000.0000 [IU] | Freq: Three times a day (TID) | INTRAMUSCULAR | Status: DC
  Administered 2017-02-09 – 2017-02-11 (×5): 5000 [IU] via SUBCUTANEOUS
  Filled 2017-02-09 (×5): qty 1

## 2017-02-09 MED ORDER — LATANOPROST 0.005 % OP SOLN
1.0000 [drp] | Freq: Every day | OPHTHALMIC | Status: DC
  Administered 2017-02-09: 1 [drp] via OPHTHALMIC
  Filled 2017-02-09: qty 2.5

## 2017-02-09 MED ORDER — QUETIAPINE FUMARATE 25 MG PO TABS
50.0000 mg | ORAL_TABLET | Freq: Every day | ORAL | Status: DC
  Administered 2017-02-09: 50 mg via ORAL
  Filled 2017-02-09: qty 2

## 2017-02-09 MED ORDER — BRIMONIDINE TARTRATE 0.2 % OP SOLN
1.0000 [drp] | Freq: Two times a day (BID) | OPHTHALMIC | Status: DC
  Administered 2017-02-09 – 2017-02-11 (×3): 1 [drp] via OPHTHALMIC
  Filled 2017-02-09: qty 5

## 2017-02-09 MED ORDER — ONDANSETRON HCL 4 MG PO TABS: 4.0000 mg | ORAL_TABLET | Freq: Four times a day (QID) | ORAL | Status: DC | PRN

## 2017-02-09 MED ORDER — SODIUM CHLORIDE 0.9 % IV SOLN
INTRAVENOUS | Status: DC
  Administered 2017-02-09 – 2017-02-11 (×3): via INTRAVENOUS

## 2017-02-09 MED ORDER — VERAPAMIL HCL ER 240 MG PO TBCR
240.0000 mg | EXTENDED_RELEASE_TABLET | Freq: Every evening | ORAL | Status: DC
  Administered 2017-02-09: 240 mg via ORAL
  Filled 2017-02-09 (×3): qty 1

## 2017-02-09 MED ORDER — DORZOLAMIDE HCL-TIMOLOL MAL 2-0.5 % OP SOLN
1.0000 [drp] | Freq: Two times a day (BID) | OPHTHALMIC | Status: DC
  Administered 2017-02-10 – 2017-02-11 (×3): 1 [drp] via OPHTHALMIC
  Filled 2017-02-09: qty 10

## 2017-02-09 MED ORDER — ACETAMINOPHEN 650 MG RE SUPP: 650.0000 mg | Freq: Four times a day (QID) | RECTAL | Status: DC | PRN

## 2017-02-09 MED ORDER — CARBAMAZEPINE ER 200 MG PO TB12
200.0000 mg | ORAL_TABLET | Freq: Two times a day (BID) | ORAL | Status: DC
  Administered 2017-02-09 – 2017-02-11 (×5): 200 mg via ORAL
  Filled 2017-02-09 (×6): qty 1

## 2017-02-09 MED ORDER — HYDROMORPHONE HCL 1 MG/ML IJ SOLN: 1.0000 mg | INTRAMUSCULAR | Status: DC | PRN

## 2017-02-09 MED ORDER — ACETAMINOPHEN 325 MG PO TABS
650.0000 mg | ORAL_TABLET | Freq: Four times a day (QID) | ORAL | Status: DC | PRN
  Administered 2017-02-09 – 2017-02-11 (×2): 650 mg via ORAL
  Filled 2017-02-09 (×2): qty 2

## 2017-02-09 MED ORDER — BISACODYL 5 MG PO TBEC: 5.0000 mg | DELAYED_RELEASE_TABLET | Freq: Every day | ORAL | Status: DC | PRN

## 2017-02-09 MED ORDER — ONDANSETRON HCL 4 MG/2ML IJ SOLN: 4.0000 mg | Freq: Four times a day (QID) | INTRAMUSCULAR | Status: DC | PRN

## 2017-02-09 MED ORDER — VITAMIN D 1000 UNITS PO TABS
1000.0000 [IU] | ORAL_TABLET | Freq: Every day | ORAL | Status: DC
  Administered 2017-02-09 – 2017-02-11 (×3): 1000 [IU] via ORAL
  Filled 2017-02-09 (×3): qty 1

## 2017-02-09 MED ORDER — TRAMADOL HCL 50 MG PO TABS
50.0000 mg | ORAL_TABLET | Freq: Four times a day (QID) | ORAL | Status: DC | PRN
  Administered 2017-02-09 – 2017-02-10 (×2): 50 mg via ORAL
  Filled 2017-02-09 (×2): qty 1

## 2017-02-09 MED ORDER — SENNOSIDES-DOCUSATE SODIUM 8.6-50 MG PO TABS: 1.0000 | ORAL_TABLET | Freq: Every evening | ORAL | Status: DC | PRN

## 2017-02-09 MED ORDER — CALCITRIOL 0.25 MCG PO CAPS
0.2500 ug | ORAL_CAPSULE | Freq: Every day | ORAL | Status: DC
  Administered 2017-02-09 – 2017-02-11 (×3): 0.25 ug via ORAL
  Filled 2017-02-09 (×3): qty 1

## 2017-02-09 MED ORDER — PANTOPRAZOLE SODIUM 40 MG PO TBEC
40.0000 mg | DELAYED_RELEASE_TABLET | Freq: Every day | ORAL | Status: DC
Start: 2017-02-09 — End: 2017-02-11
  Administered 2017-02-09 – 2017-02-11 (×3): 40 mg via ORAL
  Filled 2017-02-09 (×3): qty 1

## 2017-02-09 NOTE — H&P (Signed)
[place] [facility] [place] NAME: Joyce [name]    MR#:  [id number]  DATE OF BIRTH:  04/22/1946  DATE OF ADMISSION:  02/09/2017  PRIMARY CARE PHYSICIAN: Cletis Athens, MD   REQUESTING/REFERRING PHYSICIAN: dr Holley Raring  CHIEF COMPLAINT:   Right sided pain HISTORY OF PRESENT ILLNESS:  Joyce [name]  is a 71 y.o. female with a known history of Polycystic kidney disease with chronic kidney disease stage IV who was seen by Dr. Holley Raring this morning. She is complaining of excruciating right-sided lower quadrant pain similar to her past episodes of ruptured cyst. She is admitted to the hospital for intractable pain and evaluation for ruptured renal cyst. Pain is currently 6 out of 10. No exacerbating or relieving factors. She has had prior hospitalizations for similar issues. She does deny hematuria today. No nausea vomiting or chest pain. PAST MEDICAL HISTORY:   Past Medical History:  Diagnosis Date  . Anal fissure   . Bipolar affective disorder ([place])   . CKD (chronic kidney disease)    Dr Holley Raring [name] [facility]  . Colon polyps   . Diverticulitis   . Diverticulitis   . Family history of adverse reaction to anesthesia    mom - PONV  . GERD (gastroesophageal reflux disease)   . Headache    migraines - none over 10 yrs  . Heart murmur   . History of hiatal hernia   . Hypertension   . Pancreatitis    Valproic acid  . Vertigo     PAST SURGICAL HISTORY:   Past Surgical History:  Procedure Laterality Date  . ABDOMINAL HYSTERECTOMY  1990 ?  . AV FISTULA PLACEMENT  4/30  . BREAST EXCISIONAL BIOPSY Left 1994   neg surgical bx  . CHOLECYSTECTOMY  2003  . COLONOSCOPY  2014   Dr. Jamal Collin  . COLONOSCOPY WITH PROPOFOL N/A 09/24/2015   Procedure: COLONOSCOPY WITH random colon byopies.;  Surgeon: Lucilla Lame, MD;  Location: [place];  Service: Endoscopy;  Laterality: N/A;  . ESOPHAGOGASTRODUODENOSCOPY (EGD) WITH PROPOFOL N/A 09/24/2015   Procedure:  ESOPHAGOGASTRODUODENOSCOPY (EGD) ;  Surgeon: Lucilla Lame, MD;  Location: [place];  Service: Endoscopy;  Laterality: N/A;  . EYE SURGERY    . PERIPHERAL VASCULAR CATHETERIZATION N/A 05/29/2015   Procedure: A/V Shuntogram/Fistulagram;  Surgeon: Katha Cabal, MD;  Location: [place] CV [facility];  Service: Cardiovascular;  Laterality: N/A;  . PERIPHERAL VASCULAR CATHETERIZATION N/A 05/29/2015   Procedure: A/V Shunt Intervention;  Surgeon: Katha Cabal, MD;  Location: [place] [facility];  Service: Cardiovascular;  Laterality: N/A;  . POLYPECTOMY  09/24/2015   Procedure: POLYPECTOMY INTESTINAL;  Surgeon: Lucilla Lame, MD;  Location: [place];  Service: Endoscopy;;  cecal polyp ascending polyp    SOCIAL HISTORY:   Social History  Substance Use Topics  . Smoking status: Never Smoker  . Smokeless tobacco: Never Used  . Alcohol use No    FAMILY HISTORY:   Family History  Problem Relation Age of Onset  . Stroke Father   . Hypertension Father   . Breast cancer Other   . Colon cancer Neg Hx   . Liver disease Neg Hx     DRUG ALLERGIES:   Allergies  Allergen Reactions  . Indomethacin Hives  . Morphine And Related   . Pollen Extract Other (See Comments)    Sinus problems and HA    REVIEW OF SYSTEMS:   Review of Systems  Constitutional: Negative.  Negative  for chills, fever and malaise/fatigue.  HENT: Negative.  Negative for ear discharge, ear pain, hearing loss, nosebleeds and sore throat.   Eyes: Negative.  Negative for blurred vision and pain.  Respiratory: Negative.  Negative for cough, hemoptysis, shortness of breath and wheezing.   Cardiovascular: Negative.  Negative for chest pain, palpitations and leg swelling.  Gastrointestinal: Negative.  Negative for abdominal pain, blood in stool, diarrhea, nausea and vomiting.  Genitourinary: Positive for flank pain. Negative for dysuria.  Musculoskeletal: Negative for back pain.  Skin: Negative.    Neurological: Negative for dizziness, tremors, speech change, focal weakness, seizures and headaches.  Endo/Heme/Allergies: Negative.  Does not bruise/bleed easily.  Psychiatric/Behavioral: Negative.  Negative for depression, hallucinations and suicidal ideas.    MEDICATIONS AT HOME:   Prior to Admission medications   Medication Sig Start Date End Date Taking? Authorizing Provider  acetaminophen (TYLENOL) 325 MG tablet Take 325 mg by mouth every 8 (eight) hours as needed.    [provider]  bimatoprost (LUMIGAN) 0.01 % SOLN 1 drop at bedtime. Both eyes    [provider]  brimonidine (ALPHAGAN) 0.2 % ophthalmic solution 2 (two) times daily. One drop both eyes bid    [provider]  calcitRIOL (ROCALTROL) 0.25 MCG capsule Take 0.25 mcg by mouth daily.    [provider]  carbamazepine (TEGRETOL XR) 200 MG 12 hr tablet Take 200 mg by mouth 2 (two) times daily.    [provider]  cefdinir (OMNICEF) 300 MG capsule Take 300 mg by mouth 2 (two) times daily.    [provider]  cholecalciferol (VITAMIN D) 1000 UNITS tablet Take 1,000 Units by mouth daily.    [provider]  cyanocobalamin (,VITAMIN B-12,) 1000 MCG/ML injection Inject 1,000 mcg into the muscle every 30 (thirty) days.    [provider]  docusate sodium (COLACE) 100 MG capsule Take 1 capsule (100 mg total) by mouth 2 (two) times daily as needed for mild constipation. Patient not taking: Reported on 12/18/2016 10/12/16   Dustin Flock, MD  dorzolamide-timolol (COSOPT) 22.3-6.8 MG/ML ophthalmic solution Place 1 drop into both eyes 2 (two) times daily. 7am and 2pm    [provider]  latanoprost (XALATAN) 0.005 % ophthalmic solution 1 drop at bedtime.    [provider]  oxyCODONE-acetaminophen (PERCOCET/ROXICET) 5-325 MG tablet Take 1 tablet by mouth every 6 (six) hours as needed for moderate pain or severe pain. 12/20/16   Demetrios Loll, MD   pantoprazole (PROTONIX) 40 MG tablet TAKE 1 TABLET EVERY DAY 10/08/16   Lucilla Lame, MD  QUEtiapine (SEROQUEL) 50 MG tablet Take 1 tablet (50 mg total) by mouth at bedtime. She takes two 25mg  tabs at bedtime 10/06/16   Gladstone Lighter, MD  timolol (TIMOPTIC) 0.5 % ophthalmic solution Place 1 drop into both eyes 2 (two) times daily. 7am and 2pm    [provider]  traMADol (ULTRAM) 50 MG tablet Take 1 tablet (50 mg total) by mouth every 6 (six) hours as needed for moderate pain or severe pain. 10/06/16   Gladstone Lighter, MD  verapamil (CALAN-SR) 240 MG CR tablet Take 240 mg by mouth every evening.     [provider]      VITAL SIGNS:  Blood pressure (!) 129/57, pulse 87, temperature 98.3 F (36.8 C), temperature source Oral, resp. rate 20, SpO2 100 %.  PHYSICAL EXAMINATION:   Physical Exam  Constitutional: She is oriented to person, place, and time and well-developed, well-nourished, and in  no distress. No distress.  HENT:  Head: Normocephalic.  Eyes: No scleral icterus.  Neck: Normal range of motion. Neck supple. No JVD present. No tracheal deviation present.  Cardiovascular: Normal rate and regular rhythm.  Exam reveals no gallop and no friction rub.   Murmur heard. Pulmonary/Chest: Effort normal and breath sounds normal. No respiratory distress. She has no wheezes. She has no rales. She exhibits no tenderness.  Abdominal: Soft. Bowel sounds are normal. She exhibits no distension and no mass. There is tenderness. There is no rebound and no guarding.  Musculoskeletal: Normal range of motion. She exhibits no edema.  Neurological: She is alert and oriented to person, place, and time.  Skin: Skin is warm. No rash noted. No erythema.  Psychiatric: Affect and judgment normal.      LABORATORY PANEL:   CBC No results for input(s): WBC, HGB, HCT, PLT in the last 168  hours. ------------------------------------------------------------------------------------------------------------------  Chemistries  No results for input(s): NA, K, CL, CO2, GLUCOSE, BUN, CREATININE, CALCIUM, MG, AST, ALT, ALKPHOS, BILITOT in the last 168 hours.  Invalid input(s): GFRCGP ------------------------------------------------------------------------------------------------------------------  Cardiac Enzymes No results for input(s): TROPONINI in the last 168 hours. ------------------------------------------------------------------------------------------------------------------  RADIOLOGY:  No results found.  EKG:   Orders placed or performed during the hospital encounter of 01/05/16  . ED EKG  . ED EKG  . EKG    IMPRESSION AND PLAN:   71 year old female chronic kidney disease stage IV and polycystic kidney disease with several hospitalizations in the past for ruptured cyst who presents with right-sided intractable pain due to ruptured cyst.  1. Intractable abdominal pain most likely due to ruptured cyst CT abdomen/pelvis without contrast requested Pain control with oral and IV medications Patient is agreeable to try Dilaudid. She has allergy to morphine which is intolerance of medication. Check hemoglobin as patient has had a hemorrhagic cyst ruptures in the past and has required blood transfusion  2. Chronic kidney disease stage IV: Check BMP today Nephrology consult  3. Glaucoma: Continue eyedrops  4. Essential hypertension on verapamil  5. Bipolar affective disorder: Continue Seroquel and Tegretol   All the records are reviewed and case discussed with ED provider. Management plans discussed with the patient and daughter and she is in agreement  CODE STATUS: DNI  TOTAL TIME TAKING CARE OF THIS PATIENT: 45 minutes.    Merel Santoli M.D on 02/09/2017 at 2:25 PM  Between 7am to 6pm - Pager - [phone]  After 6pm go to [email or web address] - password [email or web address]  [facility]  Office  [phone]  CC: Primary care physician; Cletis Athens, MD

## 2017-02-10 LAB — BASIC METABOLIC PANEL
ANION GAP: 11 (ref 5–15)
BUN: 59 mg/dL — ABNORMAL HIGH (ref 6–20)
CALCIUM: 8.2 mg/dL — AB (ref 8.9–10.3)
CO2: 19 mmol/L — AB (ref 22–32)
Chloride: 106 mmol/L (ref 101–111)
Creatinine, Ser: 4.65 mg/dL — ABNORMAL HIGH (ref 0.44–1.00)
GFR, EST AFRICAN AMERICAN: 10 mL/min — AB (ref >60)
GFR, EST NON AFRICAN AMERICAN: 9 mL/min — AB (ref >60)
Glucose, Bld: 127 mg/dL — ABNORMAL HIGH (ref 65–99)
POTASSIUM: 3.4 mmol/L — AB (ref 3.5–5.1)
Sodium: 136 mmol/L (ref 135–145)

## 2017-02-10 LAB — CBC
HEMATOCRIT: 25.9 % — AB (ref 35.0–47.0)
HEMOGLOBIN: 8.7 g/dL — AB (ref 12.0–16.0)
MCH: 30.6 pg (ref 26.0–34.0)
MCHC: 33.5 g/dL (ref 32.0–36.0)
MCV: 91.3 fL (ref 80.0–100.0)
Platelets: 233 10*3/uL (ref 150–440)
RBC: 2.83 MIL/uL — AB (ref 3.80–5.20)
RDW: 14.5 % (ref 11.5–14.5)
WBC: 11.8 10*3/uL — ABNORMAL HIGH (ref 3.6–11.0)

## 2017-02-10 MED ORDER — TEMAZEPAM 15 MG PO CAPS
15.0000 mg | ORAL_CAPSULE | Freq: Every evening | ORAL | Status: DC | PRN
  Administered 2017-02-10: 15 mg via ORAL
  Filled 2017-02-10: qty 1

## 2017-02-10 MED ORDER — QUETIAPINE FUMARATE 25 MG PO TABS
25.0000 mg | ORAL_TABLET | Freq: Every day | ORAL | Status: DC
Start: 2017-02-10 — End: 2017-02-11
  Administered 2017-02-10: 25 mg via ORAL
  Filled 2017-02-10: qty 1

## 2017-02-10 NOTE — Progress Notes (Signed)
[facility]  ROUNDING NOTE   Subjective:   Joyce [name] admitted to [facility] on 02/09/2017 for Flank pain suspected renal cyst rupture admitted from our office.   Objective:  Vital signs in last 24 hours:  Temp:  [98.3 F (36.8 C)-99.1 F (37.3 C)] 98.3 F (36.8 C) (07/23 2307) Pulse Rate:  [61-87] 62 (07/24 0105) Resp:  [18-22] 22 (07/23 2307) BP: (99-142)/(37-57) 107/41 (07/24 0105) SpO2:  [97 %-100 %] 98 % (07/24 0105) Weight:  [80.5 kg (177 lb 8 oz)] 80.5 kg (177 lb 8 oz) (07/23 1405)  Weight change:  Filed Weights   02/09/17 1405  Weight: 80.5 kg (177 lb 8 oz)    Intake/Output: I/O last 3 completed shifts: In: 973 [P.O.:240; I.V.:733] Out: 850 [Urine:850]   Intake/Output this shift:  Total I/O In: 419 [P.O.:240; I.V.:179] Out: 200 [Urine:200]  Physical Exam: General: NAD,   Head: Normocephalic, atraumatic. Moist oral mucosal membranes  Eyes: Anicteric, PERRL  Neck: Supple, trachea midline  Lungs:  Clear to auscultation  Heart: Regular rate and rhythm  Abdomen:  Right flank pain  Extremities:  peripheral edema.  Neurologic: Nonfocal, moving all four extremities  Skin: No lesions  Access: Left LUE AVF    Basic Metabolic Panel:  Recent Labs Lab 02/10/17 0358  NA 136  K 3.4*  CL 106  CO2 19*  GLUCOSE 127*  BUN 59*  CREATININE 4.65*  CALCIUM 8.2*    Liver Function Tests: No results for input(s): AST, ALT, ALKPHOS, BILITOT, PROT, ALBUMIN in the last 168 hours. No results for input(s): LIPASE, AMYLASE in the last 168 hours. No results for input(s): AMMONIA in the last 168 hours.  CBC:  Recent Labs Lab 02/10/17 0358  WBC 11.8*  HGB 8.7*  HCT 25.9*  MCV 91.3  PLT 233    Cardiac Enzymes: No results for input(s): CKTOTAL, CKMB, CKMBINDEX, TROPONINI in the last 168 hours.  BNP: Invalid input(s): POCBNP  CBG: No results for input(s): GLUCAP in the last 168 hours.  Microbiology: Results for orders placed or performed  during the hospital encounter of 12/18/16  Urine culture     Status: Abnormal   Collection Time: 12/18/16  4:24 PM  Result Value Ref Range Status   Specimen Description URINE, RANDOM  Final   Special Requests Add on to UA collected on admission  Final   Culture MULTIPLE SPECIES PRESENT, SUGGEST RECOLLECTION (A)  Final   Report Status 12/21/2016 FINAL  Final    Coagulation Studies: No results for input(s): LABPROT, INR in the last 72 hours.  Urinalysis: No results for input(s): COLORURINE, LABSPEC, PHURINE, GLUCOSEU, HGBUR, BILIRUBINUR, KETONESUR, PROTEINUR, UROBILINOGEN, NITRITE, LEUKOCYTESUR in the last 72 hours.  Invalid input(s): APPERANCEUR    Imaging: Ct Abdomen Pelvis Wo Contrast  Result Date: 02/09/2017 CLINICAL DATA:  71 y.o. female with a known history of Polycystic kidney disease with chronic kidney disease stage IV who was seen by Dr. Holley Raring this morning. She is complaining of excruciating right-sided lower quadrant pain similar to her past episodes of ruptured cyst. She is admitted to the hospital for intractable pain and evaluation for ruptured renal cyst. History of hysterectomy and cholecystectomy. EXAM: CT ABDOMEN AND PELVIS WITHOUT CONTRAST TECHNIQUE: Multidetector CT imaging of the abdomen and pelvis was performed following the standard protocol without IV contrast. COMPARISON:  CT abdomen dated 12/18/2016. FINDINGS: Lower chest: No acute abnormality. Hepatobiliary: No focal liver abnormality is seen. Status post cholecystectomy. No biliary dilatation. Pancreas: Unremarkable. No pancreatic ductal  dilatation or surrounding inflammatory changes. Spleen: Normal in size without focal abnormality. Adrenals/Urinary Tract: New dominant mass exophytic to the lower pole the right kidney, measuring 7.3 x 5.1 cm with surrounding perinephric edema, presumably related to a ruptured renal cyst based on history. Remainder of the numerous renal cysts are unchanged bilaterally. Bladder is  unremarkable. Stomach/Bowel: Bowel is normal in caliber. Scattered diverticulosis throughout the colon but no focal inflammatory change to suggest acute diverticulitis. Appendix is not seen but there are no inflammatory changes about the cecum to suggest acute appendicitis. Stomach appears normal. Vascular/Lymphatic: Aortic atherosclerosis. No enlarged abdominal or pelvic lymph nodes. Reproductive: Status post hysterectomy. No adnexal masses. Other: No abscess collection.  No free intraperitoneal air. Musculoskeletal: Mild degenerative change within the lower lumbar spine. No acute or suspicious osseous finding. Superficial soft tissues are unremarkable. IMPRESSION: 1. New dominant mass exophytic to the lower pole of the right kidney, presumably related to recently ruptured cyst based on patient's prior history of similar episodes, but density measurements not suggestive of acute intracystic hemorrhage. Associated right perinephric fluid stranding/inflammation. Follow-up CT recommended to ensure stability/resolution. 2. Remainder of the numerous bilateral renal cysts appear unchanged. 3. Colonic diverticulosis without evidence of acute diverticulitis. 4. Aortic atherosclerosis. Electronically Signed   By: Franki Cabot M.D.   On: 02/09/2017 16:07     Medications:   . sodium chloride 50 mL/hr at 02/09/17 1504   . brimonidine  1 drop Both Eyes BID  . calcitRIOL  0.25 mcg Oral Daily  . carbamazepine  200 mg Oral BID  . cholecalciferol  1,000 Units Oral Daily  . dorzolamide-timolol  1 drop Both Eyes BID  . heparin  5,000 Units Subcutaneous Q8H  . latanoprost  1 drop Both Eyes QHS  . pantoprazole  40 mg Oral Daily  . QUEtiapine  50 mg Oral QHS  . verapamil  240 mg Oral QPM   acetaminophen **OR** acetaminophen, bisacodyl, HYDROmorphone (DILAUDID) injection, ondansetron **OR** ondansetron (ZOFRAN) IV, senna-docusate, temazepam, traMADol  Assessment/ Plan:  [name] is a 71 y.o. white  female  With diverticulosis, hypertension, bipolar disorder, polycystic kidneys noted on CT scan who returns for followup of chronic kidney disease stage IV, polycystic kidney disease.  1. Chronic kidney disease stage 5 secondary to polycystic kidney disease with proteinuria. Patient has had progressive renal dysfunction.  Will need dialysis soon. No acute indication for dialysis. Patient does not want to start dialysis currently.   2. Cyst rupture: will need follow up CT next week - supportive care with pain control and IV fluids: NS at 50mL/hr  -  Not on antibiotics.   3. Hypertension: blood pressure at goal - verapamil.  4. Anemia chronic kidney disease. Hemoglobin 8.7 - not currently on epo  5. Secondary hyperparathyroidism. PTH 72 - calcitriol    LOS: 1 Artemisia Auvil 7/24/201811:49 AM

## 2017-02-10 NOTE — Progress Notes (Signed)
[facility] [place] NAME: Joyce [name]    MR#:  [id number]  DATE OF BIRTH:  1946-01-28  SUBJECTIVE:  CHIEF COMPLAINT:  No chief complaint on file.    Chronic kidney disease stage V and have renal cyst. Sent from the clinic with abdominal pain and found to have a fracture of the cyst. Feels little better today after receiving pain management but stayed last night she had restless leg and so did not had good sleep.  REVIEW OF SYSTEMS:  CONSTITUTIONAL: No fever, fatigue or weakness.  EYES: No blurred or double vision.  EARS, NOSE, AND THROAT: No tinnitus or ear pain.  RESPIRATORY: No cough, shortness of breath, wheezing or hemoptysis.  CARDIOVASCULAR: No chest pain, orthopnea, edema.  GASTROINTESTINAL: No nausea, vomiting, diarrhea , have abdominal pain.  GENITOURINARY: No dysuria, hematuria.  ENDOCRINE: No polyuria, nocturia,  HEMATOLOGY: No anemia, easy bruising or bleeding SKIN: No rash or lesion. MUSCULOSKELETAL: No joint pain or arthritis.   NEUROLOGIC: No tingling, numbness, weakness.  PSYCHIATRY: No anxiety or depression.   ROS  DRUG ALLERGIES:   Allergies  Allergen Reactions  . Indomethacin Hives  . Morphine And Related   . Pollen Extract Other (See Comments)    Sinus problems and HA    VITALS:  Blood pressure (!) 118/45, pulse 73, temperature 98.3 F (36.8 C), temperature source Oral, resp. rate (!) 22, height 5\' 3"  (1.6 m), weight 80.5 kg (177 lb 8 oz), SpO2 97 %.  PHYSICAL EXAMINATION:  GENERAL:  71 y.o.-year-old patient lying in the bed with no acute distress.  EYES: Pupils equal, round, reactive to light and accommodation. No scleral icterus. Extraocular muscles intact.  HEENT: Head atraumatic, normocephalic. Oropharynx and nasopharynx clear.  NECK:  Supple, no jugular venous distention. No thyroid enlargement, no tenderness.  LUNGS: Normal breath sounds bilaterally, no wheezing, rales,rhonchi or crepitation. No use of  accessory muscles of respiration.  CARDIOVASCULAR: S1, S2 normal. No murmurs, rubs, or gallops.  ABDOMEN: Soft, tender, nondistended. Bowel sounds present. No organomegaly or mass.  EXTREMITIES: No pedal edema, cyanosis, or clubbing.  NEUROLOGIC: Cranial nerves II through XII are intact. Muscle strength 5/5 in all extremities. Sensation intact. Gait not checked.  PSYCHIATRIC: The patient is alert and oriented x 3.  SKIN: No obvious rash, lesion, or ulcer.   Physical Exam LABORATORY PANEL:   CBC  Recent Labs Lab 02/10/17 0358  WBC 11.8*  HGB 8.7*  HCT 25.9*  PLT 233   ------------------------------------------------------------------------------------------------------------------  Chemistries   Recent Labs Lab 02/10/17 0358  NA 136  K 3.4*  CL 106  CO2 19*  GLUCOSE 127*  BUN 59*  CREATININE 4.65*  CALCIUM 8.2*   ------------------------------------------------------------------------------------------------------------------  Cardiac Enzymes No results for input(s): TROPONINI in the last 168 hours. ------------------------------------------------------------------------------------------------------------------  RADIOLOGY:  Ct Abdomen Pelvis Wo Contrast  Result Date: 02/09/2017 CLINICAL DATA:  71 y.o. female with a known history of Polycystic kidney disease with chronic kidney disease stage IV who was seen by Dr. Holley Raring this morning. She is complaining of excruciating right-sided lower quadrant pain similar to her past episodes of ruptured cyst. She is admitted to the hospital for intractable pain and evaluation for ruptured renal cyst. History of hysterectomy and cholecystectomy. EXAM: CT ABDOMEN AND PELVIS WITHOUT CONTRAST TECHNIQUE: Multidetector CT imaging of the abdomen and pelvis was performed following the standard protocol without IV contrast. COMPARISON:  CT abdomen dated 12/18/2016. FINDINGS: Lower chest: No acute abnormality. Hepatobiliary: No focal liver  abnormality is seen. Status post cholecystectomy. No biliary dilatation. Pancreas: Unremarkable. No pancreatic ductal dilatation or surrounding inflammatory changes. Spleen: Normal in size without focal abnormality. Adrenals/Urinary Tract: New dominant mass exophytic to the lower pole the right kidney, measuring 7.3 x 5.1 cm with surrounding perinephric edema, presumably related to a ruptured renal cyst based on history. Remainder of the numerous renal cysts are unchanged bilaterally. Bladder is unremarkable. Stomach/Bowel: Bowel is normal in caliber. Scattered diverticulosis throughout the colon but no focal inflammatory change to suggest acute diverticulitis. Appendix is not seen but there are no inflammatory changes about the cecum to suggest acute appendicitis. Stomach appears normal. Vascular/Lymphatic: Aortic atherosclerosis. No enlarged abdominal or pelvic lymph nodes. Reproductive: Status post hysterectomy. No adnexal masses. Other: No abscess collection.  No free intraperitoneal air. Musculoskeletal: Mild degenerative change within the lower lumbar spine. No acute or suspicious osseous finding. Superficial soft tissues are unremarkable. IMPRESSION: 1. New dominant mass exophytic to the lower pole of the right kidney, presumably related to recently ruptured cyst based on patient's prior history of similar episodes, but density measurements not suggestive of acute intracystic hemorrhage. Associated right perinephric fluid stranding/inflammation. Follow-up CT recommended to ensure stability/resolution. 2. Remainder of the numerous bilateral renal cysts appear unchanged. 3. Colonic diverticulosis without evidence of acute diverticulitis. 4. Aortic atherosclerosis. Electronically Signed   By: Franki Cabot M.D.   On: 02/09/2017 16:07    ASSESSMENT AND PLAN:   Active Problems:   Ruptured cyst of kidney  1. Intractable abdominal pain  due to ruptured cyst CT abdomen/pelvis without contrast confirmed the  finding. Pain control with oral and IV medications Patient is agreeable to try Dilaudid. She has allergy to morphine which is intolerance of medication. Check hemoglobin as patient has had a hemorrhagic cyst ruptures in the past and has required blood transfusion   Currently Hb is stable. As per nephrology- symptomatic management and follow CT next week.  2. Chronic kidney disease stage IV- V: Check BMP today Nephrology consult appreciated, no need for HD yet.  3. Glaucoma: Continue eyedrops  4. Essential hypertension on verapamil  5. Bipolar affective disorder: Continue Seroquel and Tegretol   All the records are reviewed and case discussed with Care Management/Social Workerr. Management plans discussed with the patient, family and they are in agreement.  CODE STATUS: Partial.  TOTAL TIME TAKING CARE OF THIS PATIENT: 35 minutes.     POSSIBLE D/C IN 1-2 DAYS, DEPENDING ON CLINICAL CONDITION.   Vaughan Basta M.D on 02/10/2017   Between 7am to 6pm - Pager - [phone]  After 6pm go to [email or web address] - password [email or web address] [facility]  Office  [phone]  CC: Primary care physician; Cletis Athens, MD  Note: This dictation was prepared with Dragon dictation along with smaller phrase technology. Any transcriptional errors that result from this process are unintentional.

## 2017-02-10 NOTE — Progress Notes (Signed)
MD notified of pt low bp, c/o restless leg. Orders for restoril po. Will continue to monitor.

## 2017-02-11 LAB — BASIC METABOLIC PANEL
Anion gap: 9 (ref 5–15)
BUN: 56 mg/dL — AB (ref 6–20)
CHLORIDE: 110 mmol/L (ref 101–111)
CO2: 17 mmol/L — AB (ref 22–32)
Calcium: 8.3 mg/dL — ABNORMAL LOW (ref 8.9–10.3)
Creatinine, Ser: 4.31 mg/dL — ABNORMAL HIGH (ref 0.44–1.00)
GFR calc Af Amer: 11 mL/min — ABNORMAL LOW (ref >60)
GFR calc non Af Amer: 10 mL/min — ABNORMAL LOW (ref >60)
Glucose, Bld: 152 mg/dL — ABNORMAL HIGH (ref 65–99)
POTASSIUM: 3.4 mmol/L — AB (ref 3.5–5.1)
Sodium: 136 mmol/L (ref 135–145)

## 2017-02-11 LAB — CBC
HEMATOCRIT: 25.9 % — AB (ref 35.0–47.0)
HEMOGLOBIN: 8.7 g/dL — AB (ref 12.0–16.0)
MCH: 30.5 pg (ref 26.0–34.0)
MCHC: 33.6 g/dL (ref 32.0–36.0)
MCV: 90.8 fL (ref 80.0–100.0)
Platelets: 255 10*3/uL (ref 150–440)
RBC: 2.85 MIL/uL — AB (ref 3.80–5.20)
RDW: 14.6 % — ABNORMAL HIGH (ref 11.5–14.5)
WBC: 9.5 10*3/uL (ref 3.6–11.0)

## 2017-02-11 LAB — URINALYSIS, ROUTINE W REFLEX MICROSCOPIC
Bilirubin Urine: NEGATIVE
Glucose, UA: NEGATIVE mg/dL
Ketones, ur: NEGATIVE mg/dL
Nitrite: NEGATIVE
PROTEIN: 100 mg/dL — AB
Specific Gravity, Urine: 1.008 (ref 1.005–1.030)
pH: 6 (ref 5.0–8.0)

## 2017-02-11 MED ORDER — BISACODYL 5 MG PO TBEC: 5.0000 mg | DELAYED_RELEASE_TABLET | Freq: Every day | ORAL | 0 refills | Status: DC | PRN

## 2017-02-11 MED ORDER — TRAMADOL HCL 50 MG PO TABS: 50.0000 mg | ORAL_TABLET | Freq: Four times a day (QID) | ORAL | 0 refills | Status: DC | PRN

## 2017-02-11 MED ORDER — BRIMONIDINE TARTRATE 0.2 % OP SOLN: 1.0000 [drp] | Freq: Two times a day (BID) | OPHTHALMIC | 12 refills | Status: DC

## 2017-02-11 NOTE — Care Management Important Message (Signed)
Important Message  Patient Details  Name: Joyce [name] MRN: [id number] Date of Birth: 10-18-1945   Medicare Important Message Given:  N/A - LOS <3 / Initial given by admissions    Beverly Sessions, RN 02/11/2017, 4:04 PM

## 2017-02-11 NOTE — Discharge Summary (Signed)
[facility] at [place] NAME: Joyce [name]    MR#:  [id number]  DATE OF BIRTH:  Nov 13, 1945  DATE OF ADMISSION:  02/09/2017 ADMITTING PHYSICIAN: Bettey Costa, MD  DATE OF DISCHARGE: 02/11/2017   PRIMARY CARE PHYSICIAN: Cletis Athens, MD    ADMISSION DIAGNOSIS:  Flank pain suspected renal cyst rupture  DISCHARGE DIAGNOSIS:  Active Problems:   Ruptured cyst of kidney   SECONDARY DIAGNOSIS:   Past Medical History:  Diagnosis Date  . Anal fissure   . Bipolar affective disorder ([place])   . CKD (chronic kidney disease)    Dr Holley Raring [name] [facility]  . Colon polyps   . Diverticulitis   . Diverticulitis   . Family history of adverse reaction to anesthesia    mom - PONV  . GERD (gastroesophageal reflux disease)   . Headache    migraines - none over 10 yrs  . Heart murmur   . History of hiatal hernia   . Hypertension   . Pancreatitis    Valproic acid  . Vertigo     HOSPITAL COURSE:   1. Intractable abdominal pain  due to ruptured cyst CT abdomen/pelvis without contrast confirmed the finding. Pain control with oral and IV medications Patient is agreeable to try Dilaudid. She has allergy to morphine which is intolerance of medication. Check hemoglobin as patient has had a hemorrhagic cyst ruptures in the past and has required blood transfusion   Currently Hb is stable. As per nephrology- symptomatic management and follow CT next week.   She have hematuria due to this, but Hb is stable for 48 hrs, so d/c and follow in nephro clinic.  2. Chronic kidney disease stage IV- V: Check BMP today Nephrology consult appreciated, no need for HD yet.  3. Glaucoma: Continue eyedrops  4. Essential hypertension on verapamil  5. Bipolar affective disorder: Continue Seroquel and Tegretol  DISCHARGE CONDITIONS:   Stable.  CONSULTS OBTAINED:    DRUG ALLERGIES:   Allergies  Allergen Reactions  . Indomethacin Hives  . Morphine And Related    . Pollen Extract Other (See Comments)    Sinus problems and HA    DISCHARGE MEDICATIONS:   Current Discharge Medication List    START taking these medications   Details  bisacodyl (DULCOLAX) 5 MG EC tablet Take 1 tablet (5 mg total) by mouth daily as needed for moderate constipation. Qty: 30 tablet, Refills: 0      CONTINUE these medications which have CHANGED   Details  brimonidine (ALPHAGAN) 0.2 % ophthalmic solution Place 1 drop into both eyes 2 (two) times daily. Qty: 5 mL, Refills: 12    traMADol (ULTRAM) 50 MG tablet Take 1 tablet (50 mg total) by mouth every 6 (six) hours as needed for moderate pain or severe pain. Qty: 12 tablet, Refills: 0      CONTINUE these medications which have NOT CHANGED   Details  calcitRIOL (ROCALTROL) 0.25 MCG capsule Take 0.25 mcg by mouth daily.    carbamazepine (TEGRETOL XR) 200 MG 12 hr tablet Take 200 mg by mouth 2 (two) times daily.    cholecalciferol (VITAMIN D) 1000 UNITS tablet Take 1,000 Units by mouth daily.    cyanocobalamin (,VITAMIN B-12,) 1000 MCG/ML injection Inject 1,000 mcg into the muscle every 30 (thirty) days.    dorzolamide-timolol (COSOPT) 22.3-6.8 MG/ML ophthalmic solution Place 1 drop into both eyes 2 (two) times daily. 7am and 2pm    latanoprost (XALATAN) 0.005 % ophthalmic  solution 1 drop at bedtime.    pantoprazole (PROTONIX) 40 MG tablet TAKE 1 TABLET EVERY DAY Qty: 90 tablet, Refills: 3    QUEtiapine (SEROQUEL) 50 MG tablet Take 1 tablet (50 mg total) by mouth at bedtime. She takes two 25mg  tabs at bedtime Qty: 30 tablet, Refills: 2    timolol (TIMOPTIC) 0.5 % ophthalmic solution Place 1 drop into both eyes 2 (two) times daily. 7am and 2pm    verapamil (CALAN-SR) 240 MG CR tablet Take 240 mg by mouth every evening.     acetaminophen (TYLENOL) 325 MG tablet Take 325 mg by mouth every 8 (eight) hours as needed.   Associated Diagnoses: Anemia associated with chronic renal failure    docusate sodium  (COLACE) 100 MG capsule Take 1 capsule (100 mg total) by mouth 2 (two) times daily as needed for mild constipation. Qty: 10 capsule, Refills: 0    oxyCODONE-acetaminophen (PERCOCET/ROXICET) 5-325 MG tablet Take 1 tablet by mouth every 6 (six) hours as needed for moderate pain or severe pain. Qty: 10 tablet, Refills: 0         DISCHARGE INSTRUCTIONS:    Follow with nephro clinic.  If you experience worsening of your admission symptoms, develop shortness of breath, life threatening emergency, suicidal or homicidal thoughts you must seek medical attention immediately by calling 911 or calling your MD immediately  if symptoms less severe.  You Must read complete instructions/literature along with all the possible adverse reactions/side effects for all the Medicines you take and that have been prescribed to you. Take any new Medicines after you have completely understood and accept all the possible adverse reactions/side effects.   Please note  You were cared for by a hospitalist during your hospital stay. If you have any questions about your discharge medications or the care you received while you were in the hospital after you are discharged, you can call the unit and asked to speak with the hospitalist on call if the hospitalist that took care of you is not available. Once you are discharged, your primary care physician will handle any further medical issues. Please note that NO REFILLS for any discharge medications will be authorized once you are discharged, as it is imperative that you return to your primary care physician (or establish a relationship with a primary care physician if you do not have one) for your aftercare needs so that they can reassess your need for medications and monitor your lab values.    Today   CHIEF COMPLAINT:  No chief complaint on file.   HISTORY OF PRESENT ILLNESS:  Joyce [name]  is a 71 y.o. female with a known history of Polycystic kidney disease with  chronic kidney disease stage IV who was seen by Dr. Holley Raring this morning. She is complaining of excruciating right-sided lower quadrant pain similar to her past episodes of ruptured cyst. She is admitted to the hospital for intractable pain and evaluation for ruptured renal cyst. Pain is currently 6 out of 10. No exacerbating or relieving factors. She has had prior hospitalizations for similar issues. She does deny hematuria today. No nausea vomiting or chest pain.  VITAL SIGNS:  Blood pressure (!) 148/67, pulse 94, temperature 99.5 F (37.5 C), temperature source Oral, resp. rate 18, height 5\' 3"  (1.6 m), weight 80.5 kg (177 lb 8 oz), SpO2 98 %.  I/O:   Intake/Output Summary (Last 24 hours) at 02/11/17 1437 Last data filed at 02/11/17 1334  Gross per 24 hour  Intake  1871 ml  Output              875 ml  Net              996 ml    PHYSICAL EXAMINATION:  GENERAL:  71 y.o.-year-old patient lying in the bed with no acute distress.  EYES: Pupils equal, round, reactive to light and accommodation. No scleral icterus. Extraocular muscles intact.  HEENT: Head atraumatic, normocephalic. Oropharynx and nasopharynx clear.  NECK:  Supple, no jugular venous distention. No thyroid enlargement, no tenderness.  LUNGS: Normal breath sounds bilaterally, no wheezing, rales,rhonchi or crepitation. No use of accessory muscles of respiration.  CARDIOVASCULAR: S1, S2 normal. No murmurs, rubs, or gallops.  ABDOMEN: Soft, non-tender, non-distended. Bowel sounds present. No organomegaly or mass.  EXTREMITIES: No pedal edema, cyanosis, or clubbing.  NEUROLOGIC: Cranial nerves II through XII are intact. Muscle strength 5/5 in all extremities. Sensation intact. Gait not checked.  PSYCHIATRIC: The patient is alert and oriented x 3.  SKIN: No obvious rash, lesion, or ulcer.   DATA REVIEW:   CBC  Recent Labs Lab 02/11/17 0840  WBC 9.5  HGB 8.7*  HCT 25.9*  PLT 255    Chemistries   Recent  Labs Lab 02/11/17 0840  NA 136  K 3.4*  CL 110  CO2 17*  GLUCOSE 152*  BUN 56*  CREATININE 4.31*  CALCIUM 8.3*    Cardiac Enzymes No results for input(s): TROPONINI in the last 168 hours.  Microbiology Results  Results for orders placed or performed during the hospital encounter of 12/18/16  Urine culture     Status: Abnormal   Collection Time: 12/18/16  4:24 PM  Result Value Ref Range Status   Specimen Description URINE, RANDOM  Final   Special Requests Add on to UA collected on admission  Final   Culture MULTIPLE SPECIES PRESENT, SUGGEST RECOLLECTION (A)  Final   Report Status 12/21/2016 FINAL  Final    RADIOLOGY:  Ct Abdomen Pelvis Wo Contrast  Result Date: 02/09/2017 CLINICAL DATA:  71 y.o. female with a known history of Polycystic kidney disease with chronic kidney disease stage IV who was seen by Dr. Holley Raring this morning. She is complaining of excruciating right-sided lower quadrant pain similar to her past episodes of ruptured cyst. She is admitted to the hospital for intractable pain and evaluation for ruptured renal cyst. History of hysterectomy and cholecystectomy. EXAM: CT ABDOMEN AND PELVIS WITHOUT CONTRAST TECHNIQUE: Multidetector CT imaging of the abdomen and pelvis was performed following the standard protocol without IV contrast. COMPARISON:  CT abdomen dated 12/18/2016. FINDINGS: Lower chest: No acute abnormality. Hepatobiliary: No focal liver abnormality is seen. Status post cholecystectomy. No biliary dilatation. Pancreas: Unremarkable. No pancreatic ductal dilatation or surrounding inflammatory changes. Spleen: Normal in size without focal abnormality. Adrenals/Urinary Tract: New dominant mass exophytic to the lower pole the right kidney, measuring 7.3 x 5.1 cm with surrounding perinephric edema, presumably related to a ruptured renal cyst based on history. Remainder of the numerous renal cysts are unchanged bilaterally. Bladder is unremarkable. Stomach/Bowel: Bowel  is normal in caliber. Scattered diverticulosis throughout the colon but no focal inflammatory change to suggest acute diverticulitis. Appendix is not seen but there are no inflammatory changes about the cecum to suggest acute appendicitis. Stomach appears normal. Vascular/Lymphatic: Aortic atherosclerosis. No enlarged abdominal or pelvic lymph nodes. Reproductive: Status post hysterectomy. No adnexal masses. Other: No abscess collection.  No free intraperitoneal air. Musculoskeletal: Mild degenerative change within the lower lumbar spine.  No acute or suspicious osseous finding. Superficial soft tissues are unremarkable. IMPRESSION: 1. New dominant mass exophytic to the lower pole of the right kidney, presumably related to recently ruptured cyst based on patient's prior history of similar episodes, but density measurements not suggestive of acute intracystic hemorrhage. Associated right perinephric fluid stranding/inflammation. Follow-up CT recommended to ensure stability/resolution. 2. Remainder of the numerous bilateral renal cysts appear unchanged. 3. Colonic diverticulosis without evidence of acute diverticulitis. 4. Aortic atherosclerosis. Electronically Signed   By: Franki Cabot M.D.   On: 02/09/2017 16:07    EKG:   Orders placed or performed during the hospital encounter of 01/05/16  . ED EKG  . ED EKG  . EKG      Management plans discussed with the patient, family and they are in agreement.  CODE STATUS:     Code Status Orders        Start     Ordered   02/09/17 1426  Limited resuscitation (code)  Continuous    Question Answer Comment  In the event of cardiac or respiratory ARREST: Initiate Code Blue, Call Rapid Response Yes   In the event of cardiac or respiratory ARREST: Perform CPR Yes   In the event of cardiac or respiratory ARREST: Perform Intubation/Mechanical Ventilation No   In the event of cardiac or respiratory ARREST: Use NIPPV/BiPAp only if indicated Yes   In the  event of cardiac or respiratory ARREST: Administer ACLS medications if indicated Yes   In the event of cardiac or respiratory ARREST: Perform Defibrillation or Cardioversion if indicated Yes      02/09/17 1425    Code Status History    Date Active Date Inactive Code Status Order ID Comments User Context   02/09/2017  1:33 PM 02/09/2017  2:25 PM Full Code [id number]  Bettey Costa, MD Inpatient   12/18/2016  6:43 PM 12/20/2016  2:50 PM Full Code [id number]  Hillary Bow, MD ED   10/10/2016  7:34 PM 10/12/2016  5:48 PM Full Code [id number]  Vaughan Basta, MD Inpatient   10/03/2016 11:43 AM 10/06/2016  6:06 PM Full Code [id number]  Gladstone Lighter, MD Inpatient   07/28/2016  2:47 PM 07/31/2016  4:04 PM DNR [id number]  Bettey Costa, MD ED    Advance Directive Documentation     Most Recent Value  Type of Advance Directive  Healthcare Power of [place], Living will  Pre-existing out of facility DNR order (yellow form or pink MOST form)  -  "MOST" Form in Place?  -      TOTAL TIME TAKING CARE OF THIS PATIENT: 35 minutes.    Vaughan Basta M.D on 02/11/2017 at 2:37 PM  Between 7am to 6pm - Pager - [phone]  After 6pm go to [email or web address] - password [email or web address] [facility]  Office  [phone]  CC: Primary care physician; Cletis Athens, MD   Note: This dictation was prepared with Dragon dictation along with smaller phrase technology. Any transcriptional errors that result from this process are unintentional.

## 2017-02-11 NOTE — Progress Notes (Signed)
[facility]  ROUNDING NOTE   Subjective:   Reports pain has improved.   Objective:  Vital signs in last 24 hours:  Temp:  [99 F (37.2 C)-99.5 F (37.5 C)] 99.5 F (37.5 C) (07/25 0538) Pulse Rate:  [87-94] 94 (07/25 0538) Resp:  [18-19] 18 (07/25 0538) BP: (136-148)/(45-67) 148/67 (07/25 0538) SpO2:  [97 %-98 %] 98 % (07/25 0538)  Weight change:  Filed Weights   02/09/17 1405  Weight: 80.5 kg (177 lb 8 oz)    Intake/Output: I/O last 3 completed shifts: In: 2182 [P.O.:480; I.V.:1702] Out: 2703 [Urine:1825]   Intake/Output this shift:  Total I/O In: 342 [P.O.:240; I.V.:102] Out: -   Physical Exam: General: NAD,   Head: Normocephalic, atraumatic. Moist oral mucosal membranes  Eyes: Anicteric, PERRL  Neck: Supple, trachea midline  Lungs:  Clear to auscultation  Heart: Regular rate and rhythm  Abdomen:  nontender  Extremities:  peripheral edema.  Neurologic: Nonfocal, moving all four extremities  Skin: No lesions  Access: Left LUE AVF    Basic Metabolic Panel:  Recent Labs Lab 02/10/17 0358 02/11/17 0840  NA 136 136  K 3.4* 3.4*  CL 106 110  CO2 19* 17*  GLUCOSE 127* 152*  BUN 59* 56*  CREATININE 4.65* 4.31*  CALCIUM 8.2* 8.3*    Liver Function Tests: No results for input(s): AST, ALT, ALKPHOS, BILITOT, PROT, ALBUMIN in the last 168 hours. No results for input(s): LIPASE, AMYLASE in the last 168 hours. No results for input(s): AMMONIA in the last 168 hours.  CBC:  Recent Labs Lab 02/10/17 0358 02/11/17 0840  WBC 11.8* 9.5  HGB 8.7* 8.7*  HCT 25.9* 25.9*  MCV 91.3 90.8  PLT 233 255    Cardiac Enzymes: No results for input(s): CKTOTAL, CKMB, CKMBINDEX, TROPONINI in the last 168 hours.  BNP: Invalid input(s): POCBNP  CBG: No results for input(s): GLUCAP in the last 168 hours.  Microbiology: Results for orders placed or performed during the hospital encounter of 12/18/16  Urine culture     Status: Abnormal   Collection Time: 12/18/16  4:24 PM  Result Value Ref Range Status   Specimen Description URINE, RANDOM  Final   Special Requests Add on to UA collected on admission  Final   Culture MULTIPLE SPECIES PRESENT, SUGGEST RECOLLECTION (A)  Final   Report Status 12/21/2016 FINAL  Final    Coagulation Studies: No results for input(s): LABPROT, INR in the last 72 hours.  Urinalysis: No results for input(s): COLORURINE, LABSPEC, PHURINE, GLUCOSEU, HGBUR, BILIRUBINUR, KETONESUR, PROTEINUR, UROBILINOGEN, NITRITE, LEUKOCYTESUR in the last 72 hours.  Invalid input(s): APPERANCEUR    Imaging: Ct Abdomen Pelvis Wo Contrast  Result Date: 02/09/2017 CLINICAL DATA:  71 y.o. female with a known history of Polycystic kidney disease with chronic kidney disease stage IV who was seen by Dr. Holley Raring this morning. She is complaining of excruciating right-sided lower quadrant pain similar to her past episodes of ruptured cyst. She is admitted to the hospital for intractable pain and evaluation for ruptured renal cyst. History of hysterectomy and cholecystectomy. EXAM: CT ABDOMEN AND PELVIS WITHOUT CONTRAST TECHNIQUE: Multidetector CT imaging of the abdomen and pelvis was performed following the standard protocol without IV contrast. COMPARISON:  CT abdomen dated 12/18/2016. FINDINGS: Lower chest: No acute abnormality. Hepatobiliary: No focal liver abnormality is seen. Status post cholecystectomy. No biliary dilatation. Pancreas: Unremarkable. No pancreatic ductal dilatation or surrounding inflammatory changes. Spleen: Normal in size without focal abnormality. Adrenals/Urinary Tract: New dominant mass exophytic  to the lower pole the right kidney, measuring 7.3 x 5.1 cm with surrounding perinephric edema, presumably related to a ruptured renal cyst based on history. Remainder of the numerous renal cysts are unchanged bilaterally. Bladder is unremarkable. Stomach/Bowel: Bowel is normal in caliber. Scattered diverticulosis  throughout the colon but no focal inflammatory change to suggest acute diverticulitis. Appendix is not seen but there are no inflammatory changes about the cecum to suggest acute appendicitis. Stomach appears normal. Vascular/Lymphatic: Aortic atherosclerosis. No enlarged abdominal or pelvic lymph nodes. Reproductive: Status post hysterectomy. No adnexal masses. Other: No abscess collection.  No free intraperitoneal air. Musculoskeletal: Mild degenerative change within the lower lumbar spine. No acute or suspicious osseous finding. Superficial soft tissues are unremarkable. IMPRESSION: 1. New dominant mass exophytic to the lower pole of the right kidney, presumably related to recently ruptured cyst based on patient's prior history of similar episodes, but density measurements not suggestive of acute intracystic hemorrhage. Associated right perinephric fluid stranding/inflammation. Follow-up CT recommended to ensure stability/resolution. 2. Remainder of the numerous bilateral renal cysts appear unchanged. 3. Colonic diverticulosis without evidence of acute diverticulitis. 4. Aortic atherosclerosis. Electronically Signed   By: Franki Cabot M.D.   On: 02/09/2017 16:07     Medications:   . sodium chloride 50 mL/hr at 02/11/17 0519   . brimonidine  1 drop Both Eyes BID  . calcitRIOL  0.25 mcg Oral Daily  . carbamazepine  200 mg Oral BID  . cholecalciferol  1,000 Units Oral Daily  . dorzolamide-timolol  1 drop Both Eyes BID  . heparin  5,000 Units Subcutaneous Q8H  . latanoprost  1 drop Both Eyes QHS  . pantoprazole  40 mg Oral Daily  . QUEtiapine  25 mg Oral QHS  . verapamil  240 mg Oral QPM   acetaminophen **OR** acetaminophen, bisacodyl, HYDROmorphone (DILAUDID) injection, ondansetron **OR** ondansetron (ZOFRAN) IV, senna-docusate, temazepam, traMADol  Assessment/ Plan:  [name] is a 71 y.o. white female  With diverticulosis, hypertension, bipolar disorder, polycystic kidneys noted  on CT scan who returns for followup of chronic kidney disease stage IV, polycystic kidney disease.  1. Chronic kidney disease stage 5 secondary to polycystic kidney disease with proteinuria. Patient has had progressive renal dysfunction.  No acute indication for dialysis   2. Cyst rupture: will need follow up CT next week - supportive care with pain control and IV fluids: NS at 52mL/hr  -  Not on antibiotics.   3. Hypertension: blood pressure at goal - verapamil.  4. Anemia chronic kidney disease. Hemoglobin 8.7 - not currently on epo  5. Secondary hyperparathyroidism. PTH 72 - calcitriol    LOS: 2 Kaian Fahs 7/25/201812:53 PM

## 2017-02-11 NOTE — Care Management Important Message (Signed)
Important Message  Patient Details  Name: Joyce [name] MRN: [id number] Date of Birth: 1945-10-30   Medicare Important Message Given:  N/A - LOS <3 / Initial given by admissions    Beverly Sessions, RN 02/11/2017, 4:04 PM

## 2017-02-12 ENCOUNTER — Inpatient Hospital Stay
Admission: AD | Admit: 2017-02-12 | Discharge: 2017-02-15 | DRG: 699 | Disposition: A | Discharge status: Home / Self Care | Payer: Medicare HMO | Source: Ambulatory Visit | Attending: Specialist | Admitting: Specialist

## 2017-02-12 DIAGNOSIS — H409 Unspecified glaucoma: Secondary | ICD-10-CM | POA: Diagnosis present

## 2017-02-12 DIAGNOSIS — N390 Urinary tract infection, site not specified: Secondary | ICD-10-CM | POA: Diagnosis present

## 2017-02-12 DIAGNOSIS — N2581 Secondary hyperparathyroidism of renal origin: Secondary | ICD-10-CM | POA: Diagnosis not present

## 2017-02-12 DIAGNOSIS — B9620 Unspecified Escherichia coli [E. coli] as the cause of diseases classified elsewhere: Secondary | ICD-10-CM | POA: Diagnosis present

## 2017-02-12 DIAGNOSIS — Z823 Family history of stroke: Secondary | ICD-10-CM | POA: Diagnosis not present

## 2017-02-12 DIAGNOSIS — F319 Bipolar disorder, unspecified: Secondary | ICD-10-CM | POA: Diagnosis present

## 2017-02-12 DIAGNOSIS — N184 Chronic kidney disease, stage 4 (severe): Secondary | ICD-10-CM | POA: Diagnosis not present

## 2017-02-12 DIAGNOSIS — E785 Hyperlipidemia, unspecified: Secondary | ICD-10-CM | POA: Diagnosis present

## 2017-02-12 DIAGNOSIS — Z8249 Family history of ischemic heart disease and other diseases of the circulatory system: Secondary | ICD-10-CM

## 2017-02-12 DIAGNOSIS — Z9049 Acquired absence of other specified parts of digestive tract: Secondary | ICD-10-CM

## 2017-02-12 DIAGNOSIS — R319 Hematuria, unspecified: Secondary | ICD-10-CM | POA: Diagnosis not present

## 2017-02-12 DIAGNOSIS — K219 Gastro-esophageal reflux disease without esophagitis: Secondary | ICD-10-CM | POA: Diagnosis not present

## 2017-02-12 DIAGNOSIS — I120 Hypertensive chronic kidney disease with stage 5 chronic kidney disease or end stage renal disease: Secondary | ICD-10-CM | POA: Diagnosis present

## 2017-02-12 DIAGNOSIS — N281 Cyst of kidney, acquired: Secondary | ICD-10-CM | POA: Diagnosis not present

## 2017-02-12 DIAGNOSIS — D631 Anemia in chronic kidney disease: Secondary | ICD-10-CM | POA: Diagnosis present

## 2017-02-12 DIAGNOSIS — N185 Chronic kidney disease, stage 5: Secondary | ICD-10-CM | POA: Diagnosis not present

## 2017-02-12 DIAGNOSIS — Q613 Polycystic kidney, unspecified: Secondary | ICD-10-CM | POA: Diagnosis not present

## 2017-02-12 DIAGNOSIS — I10 Essential (primary) hypertension: Secondary | ICD-10-CM | POA: Diagnosis not present

## 2017-02-12 LAB — CBC
HEMATOCRIT: 26.4 % — AB (ref 35.0–47.0)
Hemoglobin: 8.9 g/dL — ABNORMAL LOW (ref 12.0–16.0)
MCH: 30.8 pg (ref 26.0–34.0)
MCHC: 33.6 g/dL (ref 32.0–36.0)
MCV: 91.7 fL (ref 80.0–100.0)
PLATELETS: 286 10*3/uL (ref 150–440)
RBC: 2.88 MIL/uL — ABNORMAL LOW (ref 3.80–5.20)
RDW: 14.6 % — ABNORMAL HIGH (ref 11.5–14.5)
WBC: 9 10*3/uL (ref 3.6–11.0)

## 2017-02-12 MED ORDER — ACETAMINOPHEN 325 MG PO TABS
650.0000 mg | ORAL_TABLET | Freq: Four times a day (QID) | ORAL | Status: DC | PRN
  Administered 2017-02-12: 650 mg via ORAL
  Filled 2017-02-12: qty 2

## 2017-02-12 MED ORDER — DOCUSATE SODIUM 100 MG PO CAPS
100.0000 mg | ORAL_CAPSULE | Freq: Two times a day (BID) | ORAL | Status: DC | PRN
  Administered 2017-02-13: 100 mg via ORAL
  Filled 2017-02-12: qty 1

## 2017-02-12 MED ORDER — OXYCODONE-ACETAMINOPHEN 5-325 MG PO TABS
1.0000 | ORAL_TABLET | Freq: Four times a day (QID) | ORAL | Status: DC | PRN
  Administered 2017-02-12 – 2017-02-15 (×6): 1 via ORAL
  Filled 2017-02-12 (×6): qty 1

## 2017-02-12 MED ORDER — CARBAMAZEPINE ER 200 MG PO TB12
200.0000 mg | ORAL_TABLET | Freq: Two times a day (BID) | ORAL | Status: DC
Start: 2017-02-12 — End: 2017-02-15
  Administered 2017-02-12 – 2017-02-15 (×6): 200 mg via ORAL
  Filled 2017-02-12 (×7): qty 1

## 2017-02-12 MED ORDER — CALCITRIOL 0.25 MCG PO CAPS
0.2500 ug | ORAL_CAPSULE | Freq: Every day | ORAL | Status: DC
  Administered 2017-02-12 – 2017-02-15 (×4): 0.25 ug via ORAL
  Filled 2017-02-12 (×4): qty 1

## 2017-02-12 MED ORDER — TIMOLOL MALEATE 0.5 % OP SOLN
1.0000 [drp] | Freq: Two times a day (BID) | OPHTHALMIC | Status: DC
  Administered 2017-02-12 – 2017-02-14 (×5): 1 [drp] via OPHTHALMIC
  Filled 2017-02-12: qty 5

## 2017-02-12 MED ORDER — QUETIAPINE FUMARATE 25 MG PO TABS
50.0000 mg | ORAL_TABLET | Freq: Every day | ORAL | Status: DC
  Administered 2017-02-12 – 2017-02-14 (×3): 50 mg via ORAL
  Filled 2017-02-12 (×3): qty 2

## 2017-02-12 MED ORDER — BRIMONIDINE TARTRATE 0.2 % OP SOLN
1.0000 [drp] | Freq: Two times a day (BID) | OPHTHALMIC | Status: DC
  Administered 2017-02-12 – 2017-02-15 (×6): 1 [drp] via OPHTHALMIC
  Filled 2017-02-12: qty 5

## 2017-02-12 MED ORDER — SODIUM CHLORIDE 0.9 % IV SOLN
INTRAVENOUS | Status: DC
  Administered 2017-02-12 – 2017-02-14 (×3): via INTRAVENOUS

## 2017-02-12 MED ORDER — CYANOCOBALAMIN 1000 MCG/ML IJ SOLN
1000.0000 ug | INTRAMUSCULAR | Status: DC
  Administered 2017-02-12: 1000 ug via INTRAMUSCULAR
  Filled 2017-02-12: qty 1

## 2017-02-12 MED ORDER — PANTOPRAZOLE SODIUM 40 MG PO TBEC
40.0000 mg | DELAYED_RELEASE_TABLET | Freq: Every day | ORAL | Status: DC
  Administered 2017-02-12 – 2017-02-15 (×4): 40 mg via ORAL
  Filled 2017-02-12 (×4): qty 1

## 2017-02-12 MED ORDER — VERAPAMIL HCL ER 240 MG PO TBCR
240.0000 mg | EXTENDED_RELEASE_TABLET | Freq: Every evening | ORAL | Status: DC
  Administered 2017-02-12 – 2017-02-14 (×3): 240 mg via ORAL
  Filled 2017-02-12 (×4): qty 1

## 2017-02-12 MED ORDER — BISACODYL 5 MG PO TBEC: 5.0000 mg | DELAYED_RELEASE_TABLET | Freq: Every day | ORAL | Status: DC | PRN

## 2017-02-12 MED ORDER — LATANOPROST 0.005 % OP SOLN
1.0000 [drp] | Freq: Every day | OPHTHALMIC | Status: DC
  Administered 2017-02-12 – 2017-02-14 (×3): 1 [drp] via OPHTHALMIC
  Filled 2017-02-12: qty 2.5

## 2017-02-12 MED ORDER — VITAMIN D 1000 UNITS PO TABS
1000.0000 [IU] | ORAL_TABLET | Freq: Every day | ORAL | Status: DC
  Administered 2017-02-13 – 2017-02-15 (×3): 1000 [IU] via ORAL
  Filled 2017-02-12 (×3): qty 1

## 2017-02-12 MED ORDER — DORZOLAMIDE HCL-TIMOLOL MAL 2-0.5 % OP SOLN
1.0000 [drp] | Freq: Two times a day (BID) | OPHTHALMIC | Status: DC
  Administered 2017-02-12 – 2017-02-15 (×6): 1 [drp] via OPHTHALMIC
  Filled 2017-02-12: qty 10

## 2017-02-12 NOTE — Progress Notes (Signed)
Called Dr. Jannifer Franklin regarding pain medication.  Appropriate orders were placed.  Christene Slates  02/12/2017   11:13 PM

## 2017-02-12 NOTE — H&P (Signed)
Joyce [name] is an 71 y.o. female.   Chief Complaint: Right flank pain and blood in urine HPI: This is a 71 year old female who was just discharged yesterday after being hospitalized with a ruptured renal cyst. Today she noted frank increase in hematuria. Still having right flank pain worse today than yesterday. She saw her nephrologist and he contacted hospitalist service for direct admission. Currently she is complaining of pain in the right flank area.  Past Medical History:  Diagnosis Date  . Anal fissure   . Bipolar affective disorder ([place])   . CKD (chronic kidney disease)    Dr Holley Raring [name] [facility]  . Colon polyps   . Diverticulitis   . Diverticulitis   . Family history of adverse reaction to anesthesia    mom - PONV  . GERD (gastroesophageal reflux disease)   . Headache    migraines - none over 10 yrs  . Heart murmur   . History of hiatal hernia   . Hypertension   . Pancreatitis    Valproic acid  . Vertigo     Past Surgical History:  Procedure Laterality Date  . ABDOMINAL HYSTERECTOMY  1990 ?  . AV FISTULA PLACEMENT  4/30  . BREAST EXCISIONAL BIOPSY Left 1994   neg surgical bx  . CHOLECYSTECTOMY  2003  . COLONOSCOPY  2014   Dr. Jamal Collin  . COLONOSCOPY WITH PROPOFOL N/A 09/24/2015   Procedure: COLONOSCOPY WITH random colon byopies.;  Surgeon: Lucilla Lame, MD;  Location: [facility];  Service: Endoscopy;  Laterality: N/A;  . ESOPHAGOGASTRODUODENOSCOPY (EGD) WITH PROPOFOL N/A 09/24/2015   Procedure: ESOPHAGOGASTRODUODENOSCOPY (EGD) ;  Surgeon: Lucilla Lame, MD;  Location: [facility];  Service: Endoscopy;  Laterality: N/A;  . EYE SURGERY    . PERIPHERAL VASCULAR CATHETERIZATION N/A 05/29/2015   Procedure: A/V Shuntogram/Fistulagram;  Surgeon: Katha Cabal, MD;  Location: [facility];  Service: Cardiovascular;  Laterality: N/A;  . PERIPHERAL VASCULAR CATHETERIZATION N/A 05/29/2015   Procedure: A/V Shunt Intervention;  Surgeon: Katha Cabal, MD;   Location: [facility];  Service: Cardiovascular;  Laterality: N/A;  . POLYPECTOMY  09/24/2015   Procedure: POLYPECTOMY INTESTINAL;  Surgeon: Lucilla Lame, MD;  Location: [facility];  Service: Endoscopy;;  cecal polyp ascending polyp    Family History  Problem Relation Age of Onset  . Stroke Father   . Hypertension Father   . Breast cancer Other   . Colon cancer Neg Hx   . Liver disease Neg Hx    Social History:  reports that she has never smoked. She has never used smokeless tobacco. She reports that she does not drink alcohol or use drugs.  Allergies:  Allergies  Allergen Reactions  . Indomethacin Hives  . Morphine And Related   . Pollen Extract Other (See Comments)    Sinus problems and HA    Medications Prior to Admission  Medication Sig Dispense Refill  . acetaminophen (TYLENOL) 325 MG tablet Take 325 mg by mouth every 8 (eight) hours as needed.    . bisacodyl (DULCOLAX) 5 MG EC tablet Take 1 tablet (5 mg total) by mouth daily as needed for moderate constipation. 30 tablet 0  . brimonidine (ALPHAGAN) 0.2 % ophthalmic solution Place 1 drop into both eyes 2 (two) times daily. 5 mL 12  . calcitRIOL (ROCALTROL) 0.25 MCG capsule Take 0.25 mcg by mouth daily.    . carbamazepine (TEGRETOL XR) 200 MG 12 hr tablet Take 200 mg by mouth 2 (  two) times daily.    . cholecalciferol (VITAMIN D) 1000 UNITS tablet Take 1,000 Units by mouth daily.    . cyanocobalamin (,VITAMIN B-12,) 1000 MCG/ML injection Inject 1,000 mcg into the muscle every 30 (thirty) days.    Marland Kitchen docusate sodium (COLACE) 100 MG capsule Take 1 capsule (100 mg total) by mouth 2 (two) times daily as needed for mild constipation. (Patient not taking: Reported on 12/18/2016) 10 capsule 0  . dorzolamide-timolol (COSOPT) 22.3-6.8 MG/ML ophthalmic solution Place 1 drop into both eyes 2 (two) times daily. 7am and 2pm    . latanoprost (XALATAN) 0.005 % ophthalmic solution 1 drop at bedtime.    Marland Kitchen oxyCODONE-acetaminophen  (PERCOCET/ROXICET) 5-325 MG tablet Take 1 tablet by mouth every 6 (six) hours as needed for moderate pain or severe pain. (Patient not taking: Reported on 02/09/2017) 10 tablet 0  . pantoprazole (PROTONIX) 40 MG tablet TAKE 1 TABLET EVERY DAY 90 tablet 3  . QUEtiapine (SEROQUEL) 50 MG tablet Take 1 tablet (50 mg total) by mouth at bedtime. She takes two 66m tabs at bedtime 30 tablet 2  . timolol (TIMOPTIC) 0.5 % ophthalmic solution Place 1 drop into both eyes 2 (two) times daily. 7am and 2pm    . traMADol (ULTRAM) 50 MG tablet Take 1 tablet (50 mg total) by mouth every 6 (six) hours as needed for moderate pain or severe pain. 12 tablet 0  . verapamil (CALAN-SR) 240 MG CR tablet Take 240 mg by mouth every evening.       Results for orders placed or performed during the hospital encounter of 02/09/17 (from the past 48 hour(s))  Urinalysis, Routine w reflex microscopic     Status: Abnormal   Collection Time: 02/11/17  4:13 AM  Result Value Ref Range   Color, Urine YELLOW (A) YELLOW   APPearance HAZY (A) CLEAR   Specific Gravity, Urine 1.008 1.005 - 1.030   pH 6.0 5.0 - 8.0   Glucose, UA NEGATIVE NEGATIVE mg/dL   Hgb urine dipstick MODERATE (A) NEGATIVE   Bilirubin Urine NEGATIVE NEGATIVE   Ketones, ur NEGATIVE NEGATIVE mg/dL   Protein, ur 100 (A) NEGATIVE mg/dL   Nitrite NEGATIVE NEGATIVE   Leukocytes, UA LARGE (A) NEGATIVE   RBC / HPF 6-30 0 - 5 RBC/hpf   WBC, UA TOO NUMEROUS TO COUNT 0 - 5 WBC/hpf   Bacteria, UA MANY (A) NONE SEEN   Squamous Epithelial / LPF 0-5 (A) NONE SEEN  CBC     Status: Abnormal   Collection Time: 02/11/17  8:40 AM  Result Value Ref Range   WBC 9.5 3.6 - 11.0 K/uL   RBC 2.85 (L) 3.80 - 5.20 MIL/uL   Hemoglobin 8.7 (L) 12.0 - 16.0 g/dL   HCT 25.9 (L) 35.0 - 47.0 %   MCV 90.8 80.0 - 100.0 fL   MCH 30.5 26.0 - 34.0 pg   MCHC 33.6 32.0 - 36.0 g/dL   RDW 14.6 (H) 11.5 - 14.5 %   Platelets 255 150 - 440 K/uL  Basic metabolic panel     Status: Abnormal    Collection Time: 02/11/17  8:40 AM  Result Value Ref Range   Sodium 136 135 - 145 mmol/L   Potassium 3.4 (L) 3.5 - 5.1 mmol/L   Chloride 110 101 - 111 mmol/L   CO2 17 (L) 22 - 32 mmol/L   Glucose, Bld 152 (H) 65 - 99 mg/dL   BUN 56 (H) 6 - 20 mg/dL   Creatinine, Ser 4.31 (  H) 0.44 - 1.00 mg/dL   Calcium 8.3 (L) 8.9 - 10.3 mg/dL   GFR calc non Af Amer 10 (L) >60 mL/min   GFR calc Af Amer 11 (L) >60 mL/min    Comment: (NOTE) The eGFR has been calculated using the CKD EPI equation. This calculation has not been validated in all clinical situations. eGFR's persistently <60 mL/min signify possible Chronic Kidney Disease.    Anion gap 9 5 - 15   No results found.  Review of Systems  Constitutional: Negative for chills and fever.  HENT: Negative for hearing loss.   Eyes: Negative for blurred vision.  Respiratory: Negative for cough.   Cardiovascular: Negative for chest pain.  Gastrointestinal: Negative for nausea and vomiting.  Genitourinary: Positive for flank pain and hematuria.  Musculoskeletal: Negative for joint pain.  Skin: Negative for rash.  Neurological: Negative for dizziness.    Blood pressure (!) 178/75, pulse 93, temperature 98.4 F (36.9 C), temperature source Oral, resp. rate (!) 22, height _0  (1.6 m), weight 82 kg (180 lb 12.4 oz), SpO2 100 %. Physical Exam  Constitutional: She is oriented to person, place, and time. She appears well-developed and well-nourished.  HENT:  Head: Normocephalic and atraumatic.  Mouth/Throat: Oropharynx is clear and moist. No oropharyngeal exudate.  Eyes: Pupils are equal, round, and reactive to light. No scleral icterus.  Neck: Neck supple. No tracheal deviation present. No thyromegaly present.  Cardiovascular: Normal rate and regular rhythm.   No murmur heard. Respiratory: Breath sounds normal. No respiratory distress.  GI: Soft. Bowel sounds are normal. She exhibits no mass.  Right CVA tenderness.  Musculoskeletal: She  exhibits no edema or tenderness.  Lymphadenopathy:    She has no cervical adenopathy.  Neurological: She is alert and oriented to person, place, and time. No cranial nerve deficit.     Assessment/Plan 1. Hematuria. Today she's having worsening hematuria. Likely continued bleeding from the ruptured cyst. She does not complain of any weakness or dizziness. However we will recheck her hemoglobin to see if this is dropped. If it has then may need reimaging of the right kidney 2. Ruptured renal cyst on the right. Again this is been treated with pain medications and IV fluids and monitoring. 3. Stage V chronic renal failure. This is secondary to polycystic kidney disease. Place her on renal diet and adjust any medications. 4. Hypertension. We'll continue current medications 5. Bipolar disorder. We'll continue current medications  Total time spent 40 minutes  Baxter Hire, MD 02/12/2017, 6:14 PM

## 2017-02-13 LAB — URINALYSIS, COMPLETE (UACMP) WITH MICROSCOPIC
BILIRUBIN URINE: NEGATIVE
GLUCOSE, UA: NEGATIVE mg/dL
KETONES UR: NEGATIVE mg/dL
Nitrite: NEGATIVE
PH: 5 (ref 5.0–8.0)
Protein, ur: 100 mg/dL — AB
SPECIFIC GRAVITY, URINE: 1.009 (ref 1.005–1.030)

## 2017-02-13 LAB — BASIC METABOLIC PANEL
ANION GAP: 8 (ref 5–15)
BUN: 50 mg/dL — AB (ref 6–20)
CALCIUM: 8.5 mg/dL — AB (ref 8.9–10.3)
CO2: 20 mmol/L — AB (ref 22–32)
CREATININE: 4.09 mg/dL — AB (ref 0.44–1.00)
Chloride: 111 mmol/L (ref 101–111)
GFR calc Af Amer: 12 mL/min — ABNORMAL LOW (ref >60)
GFR calc non Af Amer: 10 mL/min — ABNORMAL LOW (ref >60)
GLUCOSE: 106 mg/dL — AB (ref 65–99)
POTASSIUM: 3.7 mmol/L (ref 3.5–5.1)
Sodium: 139 mmol/L (ref 135–145)

## 2017-02-13 LAB — CBC
HCT: 25.6 % — ABNORMAL LOW (ref 35.0–47.0)
Hemoglobin: 8.5 g/dL — ABNORMAL LOW (ref 12.0–16.0)
MCH: 30.1 pg (ref 26.0–34.0)
MCHC: 33.1 g/dL (ref 32.0–36.0)
MCV: 90.9 fL (ref 80.0–100.0)
PLATELETS: 298 10*3/uL (ref 150–440)
RBC: 2.82 MIL/uL — AB (ref 3.80–5.20)
RDW: 14.5 % (ref 11.5–14.5)
WBC: 10 10*3/uL (ref 3.6–11.0)

## 2017-02-13 MED ORDER — DEXTROSE 5 % IV SOLN
1.0000 g | INTRAVENOUS | Status: DC
  Administered 2017-02-13 – 2017-02-14 (×2): 1 g via INTRAVENOUS
  Filled 2017-02-13 (×3): qty 10

## 2017-02-13 MED ORDER — ACETAMINOPHEN 325 MG PO TABS
650.0000 mg | ORAL_TABLET | Freq: Four times a day (QID) | ORAL | Status: DC | PRN
  Administered 2017-02-13: 650 mg via ORAL
  Filled 2017-02-13: qty 2

## 2017-02-13 MED ORDER — EPOETIN ALFA 10000 UNIT/ML IJ SOLN
10000.0000 [IU] | Freq: Once | INTRAMUSCULAR | Status: AC
  Administered 2017-02-13: 10000 [IU] via SUBCUTANEOUS
  Filled 2017-02-13: qty 1

## 2017-02-13 NOTE — Progress Notes (Signed)
[facility] NAME: Joyce [name]    MR#:  [id number]  DATE OF BIRTH:  1945-11-30  SUBJECTIVE:  CHIEF COMPLAINT:  No chief complaint on file.    Chronic kidney disease stage V and have renal cyst. Sent from the clinic with abdominal pain and found to have a rupture of the renal cyst. Feels little better today after receiving pain management She was sent home 2 days ago, and came back with same complain. Had hematuria in last admission.   REVIEW OF SYSTEMS:  CONSTITUTIONAL: No fever, fatigue or weakness.  EYES: No blurred or double vision.  EARS, NOSE, AND THROAT: No tinnitus or ear pain.  RESPIRATORY: No cough, shortness of breath, wheezing or hemoptysis.  CARDIOVASCULAR: No chest pain, orthopnea, edema.  GASTROINTESTINAL: No nausea, vomiting, diarrhea , have abdominal pain.  GENITOURINARY: No dysuria,have hematuria.  ENDOCRINE: No polyuria, nocturia,  HEMATOLOGY: No anemia, easy bruising or bleeding SKIN: No rash or lesion. MUSCULOSKELETAL: No joint pain or arthritis.   NEUROLOGIC: No tingling, numbness, weakness.  PSYCHIATRY: No anxiety or depression.   ROS  DRUG ALLERGIES:   Allergies  Allergen Reactions  . Indomethacin Hives  . Morphine And Related   . Pollen Extract Other (See Comments)    Sinus problems and HA    VITALS:  Blood pressure (!) 143/59, pulse 78, temperature 98.8 F (37.1 C), temperature source Oral, resp. rate 20, height 5\' 3"  (1.6 m), weight 82 kg (180 lb 12.4 oz), SpO2 98 %.  PHYSICAL EXAMINATION:  GENERAL:  71 y.o.-year-old patient lying in the bed with no acute distress.  EYES: Pupils equal, round, reactive to light and accommodation. No scleral icterus. Extraocular muscles intact.  HEENT: Head atraumatic, normocephalic. Oropharynx and nasopharynx clear.  NECK:  Supple, no jugular venous distention. No thyroid enlargement, no tenderness.  LUNGS: Normal breath sounds bilaterally, no wheezing,  rales,rhonchi or crepitation. No use of accessory muscles of respiration.  CARDIOVASCULAR: S1, S2 normal. No murmurs, rubs, or gallops.  ABDOMEN: Soft, tender, nondistended. Bowel sounds present. No organomegaly or mass.  EXTREMITIES: No pedal edema, cyanosis, or clubbing.  NEUROLOGIC: Cranial nerves II through XII are intact. Muscle strength 5/5 in all extremities. Sensation intact. Gait not checked.  PSYCHIATRIC: The patient is alert and oriented x 3.  SKIN: No obvious rash, lesion, or ulcer.   Physical Exam LABORATORY PANEL:   CBC  Recent Labs Lab 02/13/17 0441  WBC 10.0  HGB 8.5*  HCT 25.6*  PLT 298   ------------------------------------------------------------------------------------------------------------------  Chemistries   Recent Labs Lab 02/13/17 0441  NA 139  K 3.7  CL 111  CO2 20*  GLUCOSE 106*  BUN 50*  CREATININE 4.09*  CALCIUM 8.5*   ------------------------------------------------------------------------------------------------------------------  Cardiac Enzymes No results for input(s): TROPONINI in the last 168 hours. ------------------------------------------------------------------------------------------------------------------  RADIOLOGY:  No results found.  ASSESSMENT AND PLAN:   Active Problems:   Hematuria  1. Intractable abdominal pain  due to ruptured cyst   Pain management.   nephro suggest repeat CT next week.  2. Chronic kidney disease stage IV- V:  Nephrology consult appreciated, no need for HD yet.  3. Glaucoma: Continue eyedrops  4. Essential hypertension on verapamil  5. Bipolar affective disorder: Continue Seroquel and Tegretol  6. UTI as per UA 2 days ago.    Ur cx, rocephin.  All the records are reviewed and case discussed with Care Management/Social Workerr. Management plans discussed with the patient, family and they are in  agreement.  CODE STATUS: Partial.  TOTAL TIME TAKING CARE OF THIS PATIENT: 35  minutes.    POSSIBLE D/C IN 1-2 DAYS, DEPENDING ON CLINICAL CONDITION.   Vaughan Basta M.D on 02/13/2017   Between 7am to 6pm - Pager - [phone]  After 6pm go to [email or web address] - password [email or web address] [facility]  Office  [phone]  CC: Primary care physician; Cletis Athens, MD  Note: This dictation was prepared with Dragon dictation along with smaller phrase technology. Any transcriptional errors that result from this process are unintentional.

## 2017-02-13 NOTE — Progress Notes (Signed)
[facility]  ROUNDING NOTE   Subjective:   [name] admitted to [facility] on 02/12/2017 for rupurted renal cyst admitted from our office.   Just discharged on 7/25 for same presentation.   NS at 45mL/hr  Objective:  Vital signs in last 24 hours:  Temp:  [98.4 F (36.9 C)-99.6 F (37.6 C)] 98.8 F (37.1 C) (07/27 1244) Pulse Rate:  [74-93] 78 (07/27 1244) Resp:  [20-22] 20 (07/27 0458) BP: (125-178)/(47-76) 143/59 (07/27 1244) SpO2:  [98 %-100 %] 98 % (07/27 1244) Weight:  [82 kg (180 lb 12.4 oz)] 82 kg (180 lb 12.4 oz) (07/26 1736)  Weight change:  Filed Weights   02/12/17 1736  Weight: 82 kg (180 lb 12.4 oz)    Intake/Output: I/O last 3 completed shifts: In: -  Out: 900 [Urine:900]   Intake/Output this shift:  Total I/O In: 1043 [P.O.:240; I.V.:803] Out: 300 [Urine:300]  Physical Exam: General: NAD,   Head: Normocephalic, atraumatic. Moist oral mucosal membranes  Eyes: Anicteric, PERRL  Neck: Supple, trachea midline  Lungs:  Clear to auscultation  Heart: Regular rate and rhythm  Abdomen:  Right flank pain  Extremities:  peripheral edema.  Neurologic: Nonfocal, moving all four extremities  Skin: No lesions  Access: Left LUE AVF    Basic Metabolic Panel:  Recent Labs Lab 02/10/17 0358 02/11/17 0840 02/13/17 0441  NA 136 136 139  K 3.4* 3.4* 3.7  CL 106 110 111  CO2 19* 17* 20*  GLUCOSE 127* 152* 106*  BUN 59* 56* 50*  CREATININE 4.65* 4.31* 4.09*  CALCIUM 8.2* 8.3* 8.5*    Liver Function Tests: No results for input(s): AST, ALT, ALKPHOS, BILITOT, PROT, ALBUMIN in the last 168 hours. No results for input(s): LIPASE, AMYLASE in the last 168 hours. No results for input(s): AMMONIA in the last 168 hours.  CBC:  Recent Labs Lab 02/10/17 0358 02/11/17 0840 02/12/17 1832 02/13/17 0441  WBC 11.8* 9.5 9.0 10.0  HGB 8.7* 8.7* 8.9* 8.5*  HCT 25.9* 25.9* 26.4* 25.6*  MCV 91.3 90.8 91.7 90.9  PLT 233 255 286 298     Cardiac Enzymes: No results for input(s): CKTOTAL, CKMB, CKMBINDEX, TROPONINI in the last 168 hours.  BNP: Invalid input(s): POCBNP  CBG: No results for input(s): GLUCAP in the last 168 hours.  Microbiology: Results for orders placed or performed during the hospital encounter of 12/18/16  Urine culture     Status: Abnormal   Collection Time: 12/18/16  4:24 PM  Result Value Ref Range Status   Specimen Description URINE, RANDOM  Final   Special Requests Add on to UA collected on admission  Final   Culture MULTIPLE SPECIES PRESENT, SUGGEST RECOLLECTION (A)  Final   Report Status 12/21/2016 FINAL  Final    Coagulation Studies: No results for input(s): LABPROT, INR in the last 72 hours.  Urinalysis:  Recent Labs  02/11/17 0413  COLORURINE YELLOW*  LABSPEC 1.008  PHURINE 6.0  GLUCOSEU NEGATIVE  HGBUR MODERATE*  BILIRUBINUR NEGATIVE  KETONESUR NEGATIVE  PROTEINUR 100*  NITRITE NEGATIVE  LEUKOCYTESUR LARGE*      Imaging: No results found.   Medications:   . sodium chloride 50 mL/hr at 02/12/17 1832   . brimonidine  1 drop Both Eyes BID  . calcitRIOL  0.25 mcg Oral Daily  . carbamazepine  200 mg Oral BID  . cholecalciferol  1,000 Units Oral Daily  . cyanocobalamin  1,000 mcg Intramuscular Q30 days  . dorzolamide-timolol  1 drop  Both Eyes BID  . latanoprost  1 drop Both Eyes QHS  . pantoprazole  40 mg Oral Daily  . QUEtiapine  50 mg Oral QHS  . timolol  1 drop Both Eyes BID  . verapamil  240 mg Oral QPM   acetaminophen, bisacodyl, docusate sodium, oxyCODONE-acetaminophen  Assessment/ Plan:  [name] is a 71 y.o. white female  With diverticulosis, hypertension, bipolar disorder, polycystic kidneys noted on CT scan who returns for followup of chronic kidney disease stage IV, polycystic kidney disease.  1. Chronic kidney disease stage 5 secondary to polycystic kidney disease with proteinuria. Patient has had progressive renal dysfunction.  Will  need dialysis soon. No acute indication for dialysis. Patient does not want to start dialysis currently.   2. Cyst rupture: will need follow up CT next week - supportive care with pain control and IV fluids: NS at 71mL/hr  -  Not on antibiotics.   3. Hypertension: blood pressure at goal - verapamil.  4. Anemia chronic kidney disease. Hemoglobin 8.5 - not currently on epo. GIve 1000 units today.   5. Secondary hyperparathyroidism. PTH 72 - calcitriol    LOS: 1 Joyce [name] 7/27/201812:52 PM

## 2017-02-14 LAB — BASIC METABOLIC PANEL
Anion gap: 9 (ref 5–15)
BUN: 46 mg/dL — ABNORMAL HIGH (ref 6–20)
CALCIUM: 8.4 mg/dL — AB (ref 8.9–10.3)
CO2: 17 mmol/L — ABNORMAL LOW (ref 22–32)
Chloride: 114 mmol/L — ABNORMAL HIGH (ref 101–111)
Creatinine, Ser: 3.91 mg/dL — ABNORMAL HIGH (ref 0.44–1.00)
GFR calc Af Amer: 12 mL/min — ABNORMAL LOW (ref >60)
GFR, EST NON AFRICAN AMERICAN: 11 mL/min — AB (ref >60)
GLUCOSE: 101 mg/dL — AB (ref 65–99)
POTASSIUM: 3.7 mmol/L (ref 3.5–5.1)
Sodium: 140 mmol/L (ref 135–145)

## 2017-02-14 LAB — CBC
HCT: 24.2 % — ABNORMAL LOW (ref 35.0–47.0)
Hemoglobin: 8.2 g/dL — ABNORMAL LOW (ref 12.0–16.0)
MCH: 31.3 pg (ref 26.0–34.0)
MCHC: 33.7 g/dL (ref 32.0–36.0)
MCV: 92.9 fL (ref 80.0–100.0)
PLATELETS: 306 10*3/uL (ref 150–440)
RBC: 2.61 MIL/uL — AB (ref 3.80–5.20)
RDW: 14.6 % — AB (ref 11.5–14.5)
WBC: 8.6 10*3/uL (ref 3.6–11.0)

## 2017-02-14 MED ORDER — SODIUM BICARBONATE 650 MG PO TABS
650.0000 mg | ORAL_TABLET | Freq: Two times a day (BID) | ORAL | Status: DC
  Administered 2017-02-14 – 2017-02-15 (×3): 650 mg via ORAL
  Filled 2017-02-14 (×3): qty 1

## 2017-02-14 NOTE — Progress Notes (Signed)
[facility]  ROUNDING NOTE   Subjective:   Continues to complain of right flank pain.   NS at 11mL/hr  Objective:  Vital signs in last 24 hours:  Temp:  [98.6 F (37 C)-99.1 F (37.3 C)] 99.1 F (37.3 C) (07/28 0433) Pulse Rate:  [73-86] 86 (07/28 0433) Resp:  [18] 18 (07/28 0433) BP: (135-146)/(49-61) 135/61 (07/28 0433) SpO2:  [97 %-98 %] 97 % (07/28 0433)  Weight change:  Filed Weights   02/12/17 1736  Weight: 82 kg (180 lb 12.4 oz)    Intake/Output: I/O last 3 completed shifts: In: 2131 [P.O.:840; I.V.:1291] Out: 2600 [Urine:2600]   Intake/Output this shift:  Total I/O In: 838.3 [P.O.:240; I.V.:598.3] Out: -   Physical Exam: General: NAD,   Head: Normocephalic, atraumatic. Moist oral mucosal membranes  Eyes: Anicteric, PERRL  Neck: Supple, trachea midline  Lungs:  Clear to auscultation  Heart: Regular rate and rhythm  Abdomen:  Right flank pain  Extremities: peripheral edema.  Neurologic: Nonfocal, moving all four extremities  Skin: No lesions  Access: Left LUE AVF    Basic Metabolic Panel:  Recent Labs Lab 02/10/17 0358 02/11/17 0840 02/13/17 0441 02/14/17 0325  NA 136 136 139 140  K 3.4* 3.4* 3.7 3.7  CL 106 110 111 114*  CO2 19* 17* 20* 17*  GLUCOSE 127* 152* 106* 101*  BUN 59* 56* 50* 46*  CREATININE 4.65* 4.31* 4.09* 3.91*  CALCIUM 8.2* 8.3* 8.5* 8.4*    Liver Function Tests: No results for input(s): AST, ALT, ALKPHOS, BILITOT, PROT, ALBUMIN in the last 168 hours. No results for input(s): LIPASE, AMYLASE in the last 168 hours. No results for input(s): AMMONIA in the last 168 hours.  CBC:  Recent Labs Lab 02/10/17 0358 02/11/17 0840 02/12/17 1832 02/13/17 0441 02/14/17 0325  WBC 11.8* 9.5 9.0 10.0 8.6  HGB 8.7* 8.7* 8.9* 8.5* 8.2*  HCT 25.9* 25.9* 26.4* 25.6* 24.2*  MCV 91.3 90.8 91.7 90.9 92.9  PLT 233 255 286 298 306    Cardiac Enzymes: No results for input(s): CKTOTAL, CKMB, CKMBINDEX, TROPONINI in the  last 168 hours.  BNP: Invalid input(s): POCBNP  CBG: No results for input(s): GLUCAP in the last 168 hours.  Microbiology: Results for orders placed or performed during the hospital encounter of 12/18/16  Urine culture     Status: Abnormal   Collection Time: 12/18/16  4:24 PM  Result Value Ref Range Status   Specimen Description URINE, RANDOM  Final   Special Requests Add on to UA collected on admission  Final   Culture MULTIPLE SPECIES PRESENT, SUGGEST RECOLLECTION (A)  Final   Report Status 12/21/2016 FINAL  Final    Coagulation Studies: No results for input(s): LABPROT, INR in the last 72 hours.  Urinalysis:  Recent Labs  02/13/17 1806  COLORURINE YELLOW*  LABSPEC 1.009  PHURINE 5.0  GLUCOSEU NEGATIVE  HGBUR LARGE*  BILIRUBINUR NEGATIVE  KETONESUR NEGATIVE  PROTEINUR 100*  NITRITE NEGATIVE  LEUKOCYTESUR LARGE*      Imaging: No results found.   Medications:   . sodium chloride 50 mL/hr at 02/13/17 1620  . cefTRIAXone (ROCEPHIN)  IV Stopped (02/13/17 1900)   . brimonidine  1 drop Both Eyes BID  . calcitRIOL  0.25 mcg Oral Daily  . carbamazepine  200 mg Oral BID  . cholecalciferol  1,000 Units Oral Daily  . cyanocobalamin  1,000 mcg Intramuscular Q30 days  . dorzolamide-timolol  1 drop Both Eyes BID  . latanoprost  1 drop  Both Eyes QHS  . pantoprazole  40 mg Oral Daily  . QUEtiapine  50 mg Oral QHS  . sodium bicarbonate  650 mg Oral BID  . timolol  1 drop Both Eyes BID  . verapamil  240 mg Oral QPM   acetaminophen, bisacodyl, docusate sodium, oxyCODONE-acetaminophen  Assessment/ Plan:  [name] is a 71 y.o. white female  With diverticulosis, hypertension, bipolar disorder, polycystic kidneys noted on CT scan who returns for followup of chronic kidney disease stage IV, polycystic kidney disease.  1. Chronic kidney disease stage 5 secondary to polycystic kidney disease with proteinuria. Patient has had progressive renal dysfunction.  Will  need dialysis soon. No acute indication for dialysis. Patient does not want to start dialysis currently.  Family meeting for later today.   2. Cyst rupture: will need follow up CT next week - supportive care with pain control and IV fluids: NS at 65mL/hr  -  Empiric ceftriaxone. Urine culture pending  3. Hypertension: blood pressure at goal - verapamil.  4. Anemia chronic kidney disease. Hemoglobin 8.2 - not currently on epo. GIven epo 10000 units on 7/27  5. Secondary hyperparathyroidism. PTH 72 - calcitriol    LOS: 2 Joyce [name] 7/28/201810:50 AM

## 2017-02-14 NOTE — Plan of Care (Addendum)
Dr. Juleen China was notified that patient's diastolic blood pressure was 42 at 1329 per patient's daughter's request. It was rechecked manually and was 138/52 in the right arm. No new orders were given.

## 2017-02-14 NOTE — Progress Notes (Signed)
[place] at [place] NAME: Joyce [name]    MR#:  [id number]  DATE OF BIRTH:  February 22, 1946  SUBJECTIVE:   Still complaining of significant right flank pain. Afebrile, hyperlipidemia stable. No other acute events overnight.  REVIEW OF SYSTEMS:    Review of Systems  Constitutional: Negative for chills and fever.  HENT: Negative for congestion and tinnitus.   Eyes: Negative for blurred vision and double vision.  Respiratory: Negative for cough, shortness of breath and wheezing.   Cardiovascular: Negative for chest pain, orthopnea and PND.  Gastrointestinal: Negative for abdominal pain, diarrhea, nausea and vomiting.  Genitourinary: Negative for dysuria and hematuria.  Neurological: Negative for dizziness, sensory change and focal weakness.  All other systems reviewed and are negative.   Nutrition: Renal diet w/ fluid restriction.  Tolerating Diet: Yes Tolerating PT: Ambulatory.    DRUG ALLERGIES:   Allergies  Allergen Reactions  . Indomethacin Hives  . Morphine And Related   . Pollen Extract Other (See Comments)    Sinus problems and HA    VITALS:  Blood pressure 135/61, pulse 86, temperature 99.1 F (37.3 C), temperature source Oral, resp. rate 18, height 5\' 3"  (1.6 m), weight 82 kg (180 lb 12.4 oz), SpO2 97 %.  PHYSICAL EXAMINATION:   Physical Exam  GENERAL:  71 y.o.-year-old patient lying in bed in no acute distress.  EYES: Pupils equal, round, reactive to light and accommodation. No scleral icterus. Extraocular muscles intact.  HEENT: Head atraumatic, normocephalic. Oropharynx and nasopharynx clear.  NECK:  Supple, no jugular venous distention. No thyroid enlargement, no tenderness.  LUNGS: Normal breath sounds bilaterally, no wheezing, rales, rhonchi. No use of accessory muscles of respiration.  CARDIOVASCULAR: S1, S2 normal. No murmurs, rubs, or gallops.  ABDOMEN: Soft, Tender in the Right Flank but no rebound, rigidity,  nondistended. Bowel sounds present. No organomegaly or mass.  EXTREMITIES: No cyanosis, clubbing or edema b/l.    NEUROLOGIC: Cranial nerves II through XII are intact. No focal Motor or sensory deficits b/l.   PSYCHIATRIC: The patient is alert and oriented x 3.  SKIN: No obvious rash, lesion, or ulcer.    LABORATORY PANEL:   CBC  Recent Labs Lab 02/14/17 0325  WBC 8.6  HGB 8.2*  HCT 24.2*  PLT 306   ------------------------------------------------------------------------------------------------------------------  Chemistries   Recent Labs Lab 02/14/17 0325  NA 140  K 3.7  CL 114*  CO2 17*  GLUCOSE 101*  BUN 46*  CREATININE 3.91*  CALCIUM 8.4*   ------------------------------------------------------------------------------------------------------------------  Cardiac Enzymes No results for input(s): TROPONINI in the last 168 hours. ------------------------------------------------------------------------------------------------------------------  RADIOLOGY:  No results found.   ASSESSMENT AND PLAN:   71 year old female with past medical history of hypertension, chronic kidney disease stage III, history of bipolar disorder, GERD, hypertension who presented to the hospital due to right sided flank pain secondary to a suspected ruptured renal cyst.  1. Intractable abdominal pain-patient has right flank pain secondary to a ruptured renal cyst. -Patient is hemodynamically stable, her pain is still worse and not being controlled with oral Tylenol, Percocet presently. -Await further nephrology input to see if she would benefit from long acting pain meds possibly fentanyl patch.  2. CKD stage 3-4-appreciate nephrology consult, and creatinine close to baseline. -No acute indication for hemodialysis.  3. Glaucoma-continue dorzolamide/timolol eyedrops. - cont. Alphagan eye drops.   4. History of bipolar disorder-continue Tegretol.  5. Essential hypertension-continue  verapamil.  6. GERD-continue Protonix.  All the records  are reviewed and case discussed with Care Management/Social Worker. Management plans discussed with the patient, family and they are in agreement.  CODE STATUS: Full code  DVT Prophylaxis: Ted's & SCD's.   TOTAL TIME TAKING CARE OF THIS PATIENT: 30 minutes.   POSSIBLE D/C IN 1-2 DAYS, DEPENDING ON CLINICAL CONDITION.   Henreitta Leber M.D on 02/14/2017 at 12:10 PM  Between 7am to 6pm - Pager - [phone]  After 6pm go to [email or web address] - Proofreader  [facility]  Office  [phone]  CC: Primary care physician; Cletis Athens, MD

## 2017-02-15 MED ORDER — CEFUROXIME AXETIL 250 MG PO TABS: 250.0000 mg | ORAL_TABLET | Freq: Two times a day (BID) | ORAL | 0 refills | Status: DC

## 2017-02-15 MED ORDER — CEFUROXIME AXETIL 250 MG PO TABS
250.0000 mg | ORAL_TABLET | Freq: Two times a day (BID) | ORAL | 0 refills | Status: AC
Start: 2017-02-15 — End: 2017-02-20

## 2017-02-15 MED ORDER — OXYCODONE-ACETAMINOPHEN 5-325 MG PO TABS: 1.0000 | ORAL_TABLET | Freq: Four times a day (QID) | ORAL | 0 refills | Status: DC | PRN

## 2017-02-15 NOTE — Progress Notes (Signed)
[facility]  ROUNDING NOTE   Subjective:   Pain well controlled. Off IV fluids. Urine culture with E. coli  Objective:  Vital signs in last 24 hours:  Temp:  [98.3 F (36.8 C)-98.8 F (37.1 C)] 98.4 F (36.9 C) (07/29 0556) Pulse Rate:  [76-85] 81 (07/29 0556) Resp:  [18-20] 20 (07/29 0556) BP: (135-140)/(42-60) 140/56 (07/29 0556) SpO2:  [97 %-99 %] 97 % (07/29 0556)  Weight change:  Filed Weights   02/12/17 1736  Weight: 82 kg (180 lb 12.4 oz)    Intake/Output: I/O last 3 completed shifts: In: 2433.3 [P.O.:600; I.V.:1833.3] Out: 1900 [Urine:1900]   Intake/Output this shift:  Total I/O In: 240 [P.O.:240] Out: -   Physical Exam: General: NAD,   Head: Normocephalic, atraumatic. Moist oral mucosal membranes  Eyes: Anicteric, PERRL  Neck: Supple, trachea midline  Lungs:  Clear to auscultation  Heart: Regular rate and rhythm  Abdomen:  Right flank pain  Extremities: peripheral edema.  Neurologic: Nonfocal, moving all four extremities  Skin: No lesions  Access: Left LUE AVF    Basic Metabolic Panel:  Recent Labs Lab 02/10/17 0358 02/11/17 0840 02/13/17 0441 02/14/17 0325  NA 136 136 139 140  K 3.4* 3.4* 3.7 3.7  CL 106 110 111 114*  CO2 19* 17* 20* 17*  GLUCOSE 127* 152* 106* 101*  BUN 59* 56* 50* 46*  CREATININE 4.65* 4.31* 4.09* 3.91*  CALCIUM 8.2* 8.3* 8.5* 8.4*    Liver Function Tests: No results for input(s): AST, ALT, ALKPHOS, BILITOT, PROT, ALBUMIN in the last 168 hours. No results for input(s): LIPASE, AMYLASE in the last 168 hours. No results for input(s): AMMONIA in the last 168 hours.  CBC:  Recent Labs Lab 02/10/17 0358 02/11/17 0840 02/12/17 1832 02/13/17 0441 02/14/17 0325  WBC 11.8* 9.5 9.0 10.0 8.6  HGB 8.7* 8.7* 8.9* 8.5* 8.2*  HCT 25.9* 25.9* 26.4* 25.6* 24.2*  MCV 91.3 90.8 91.7 90.9 92.9  PLT 233 255 286 298 306    Cardiac Enzymes: No results for input(s): CKTOTAL, CKMB, CKMBINDEX, TROPONINI in the  last 168 hours.  BNP: Invalid input(s): POCBNP  CBG: No results for input(s): GLUCAP in the last 168 hours.  Microbiology: Results for orders placed or performed during the hospital encounter of 02/12/17  Urine Culture     Status: Abnormal (Preliminary result)   Collection Time: 02/13/17  6:06 PM  Result Value Ref Range Status   Specimen Description URINE, RANDOM  Final   Special Requests NONE  Final   Culture >=100,000 COLONIES/mL ESCHERICHIA COLI (A)  Final   Report Status PENDING  Incomplete    Coagulation Studies: No results for input(s): LABPROT, INR in the last 72 hours.  Urinalysis:  Recent Labs  02/13/17 1806  COLORURINE YELLOW*  LABSPEC 1.009  PHURINE 5.0  GLUCOSEU NEGATIVE  HGBUR LARGE*  BILIRUBINUR NEGATIVE  KETONESUR NEGATIVE  PROTEINUR 100*  NITRITE NEGATIVE  LEUKOCYTESUR LARGE*      Imaging: No results found.   Medications:   . sodium chloride 50 mL/hr at 02/14/17 1501  . cefTRIAXone (ROCEPHIN)  IV 1 g (02/14/17 1816)   . brimonidine  1 drop Both Eyes BID  . calcitRIOL  0.25 mcg Oral Daily  . carbamazepine  200 mg Oral BID  . cholecalciferol  1,000 Units Oral Daily  . cyanocobalamin  1,000 mcg Intramuscular Q30 days  . dorzolamide-timolol  1 drop Both Eyes BID  . latanoprost  1 drop Both Eyes QHS  . pantoprazole  40 mg Oral Daily  . QUEtiapine  50 mg Oral QHS  . sodium bicarbonate  650 mg Oral BID  . timolol  1 drop Both Eyes BID  . verapamil  240 mg Oral QPM   acetaminophen, bisacodyl, docusate sodium, oxyCODONE-acetaminophen  Assessment/ Plan:  [name] is a 71 y.o. white female  With diverticulosis, hypertension, bipolar disorder, polycystic kidneys noted on CT scan who returns for followup of chronic kidney disease stage IV, polycystic kidney disease.  1. Chronic kidney disease stage 5 secondary to polycystic kidney disease with proteinuria. Patient has had progressive renal dysfunction.  Will need dialysis soon. No  acute indication for dialysis. Patient does not want to start dialysis currently.  Patient to discuss dialysis with Dr. Holley Raring.   2. Cyst rupture with urinary tract infection -  Empiric ceftriaxone. Urine culture with E. Coli, pending sensitivities.   3. Hypertension: blood pressure at goal - verapamil.  4. Anemia chronic kidney disease. Hemoglobin 8.2 - not currently on epo. GIven epo 10000 units on 7/27  5. Secondary hyperparathyroidism. PTH 72 - calcitriol    LOS: [place], [place] 7/29/201811:03 AM

## 2017-02-15 NOTE — Progress Notes (Signed)
Joyce [name] was admitted to the Hospital on 02/12/2017 and Discharged  02/15/2017 and should be excused from work/school   for 4 days starting 02/12/2017 , may return to work/school without any restrictions.  Call Abel Presto MD, [facility]  [phone] with questions.  Henreitta Leber M.D on 02/15/2017,at 10:09 AM

## 2017-02-15 NOTE — Discharge Summary (Signed)
[place] at [place] NAME: Joyce [name]    MR#:  [id number]  DATE OF BIRTH:  12-18-1945  DATE OF ADMISSION:  02/12/2017 ADMITTING PHYSICIAN: Theodoro Grist, MD  DATE OF DISCHARGE: 02/15/2017 12:05 PM  PRIMARY CARE PHYSICIAN: Cletis Athens, MD    ADMISSION DIAGNOSIS:  rupurted renal cyst  DISCHARGE DIAGNOSIS:  Active Problems:   Hematuria   SECONDARY DIAGNOSIS:   Past Medical History:  Diagnosis Date  . Anal fissure   . Bipolar affective disorder ([place])   . CKD (chronic kidney disease)    Dr Holley Raring [name] [facility]  . Colon polyps   . Diverticulitis   . Diverticulitis   . Family history of adverse reaction to anesthesia    mom - PONV  . GERD (gastroesophageal reflux disease)   . Headache    migraines - none over 10 yrs  . Heart murmur   . History of hiatal hernia   . Hypertension   . Pancreatitis    Valproic acid  . Vertigo     HOSPITAL COURSE:   71 year old female with past medical history of hypertension, chronic kidney disease stage III, history of bipolar disorder, GERD, hypertension who presented to the hospital due to right sided flank pain secondary to a suspected ruptured renal cyst.  1. Intractable abdominal pain-patient had right flank pain secondary to a ruptured renal cyst. -This was treated supportively with IV narcotics, oral Percocet and has improved. She still continues to have some pain but it has improved since admission. She is being discharged on some oral Percocet as needed for pain and follow-up with her primary care physician and nephrologist as an outpatient.  2. Urinary tract infection-patient was treated with IV ceftriaxone while in the hospital and now being discharged on oral Ceftin for an additional 5 days. Urine cultures were positive for Escherichia coli.  3. CKD stage 3-4-appreciate nephrology consult, and creatinine close to baseline. -No acute indication for hemodialysis and cont. Follow up with  Nephrology as outpatient.   3. Glaucoma- she will continue dorzolamide/timolol eyedrops. - she will cont. Alphagan eye drops.   4. History of bipolar disorder- she will continue Tegretol.  5. Essential hypertension- she will continue verapamil.  6. GERD- she will continue Protonix.  DISCHARGE CONDITIONS:   Stable.   CONSULTS OBTAINED:  Treatment Team:  Lavonia Dana, MD  DRUG ALLERGIES:   Allergies  Allergen Reactions  . Indomethacin Hives  . Morphine And Related   . Pollen Extract Other (See Comments)    Sinus problems and HA    DISCHARGE MEDICATIONS:   Allergies as of 02/15/2017      Reactions   Indomethacin Hives   Morphine And Related    Pollen Extract Other (See Comments)   Sinus problems and HA      Medication List    STOP taking these medications   docusate sodium 100 MG capsule Commonly known as:  COLACE     TAKE these medications   acetaminophen 325 MG tablet Commonly known as:  TYLENOL Take 325 mg by mouth every 8 (eight) hours as needed.   bisacodyl 5 MG EC tablet Commonly known as:  DULCOLAX Take 1 tablet (5 mg total) by mouth daily as needed for moderate constipation.   brimonidine 0.2 % ophthalmic solution Commonly known as:  ALPHAGAN Place 1 drop into both eyes 2 (two) times daily.   calcitRIOL 0.25 MCG capsule Commonly known as:  ROCALTROL Take 0.25 mcg by mouth daily.  carbamazepine 200 MG 12 hr tablet Commonly known as:  TEGRETOL XR Take 200 mg by mouth 2 (two) times daily.   cefUROXime 250 MG tablet Commonly known as:  CEFTIN Take 1 tablet (250 mg total) by mouth 2 (two) times daily with a meal.   cholecalciferol 1000 units tablet Commonly known as:  VITAMIN D Take 1,000 Units by mouth daily.   cyanocobalamin 1000 MCG/ML injection Commonly known as:  (VITAMIN B-12) Inject 1,000 mcg into the muscle every 30 (thirty) days.   dorzolamide-timolol 22.3-6.8 MG/ML ophthalmic solution Commonly known as:  COSOPT Place 1  drop into both eyes 2 (two) times daily. 7am and 2pm   latanoprost 0.005 % ophthalmic solution Commonly known as:  XALATAN 1 drop at bedtime.   oxyCODONE-acetaminophen 5-325 MG tablet Commonly known as:  PERCOCET/ROXICET Take 1 tablet by mouth every 6 (six) hours as needed for moderate pain or severe pain.   pantoprazole 40 MG tablet Commonly known as:  PROTONIX TAKE 1 TABLET EVERY DAY   QUEtiapine 50 MG tablet Commonly known as:  SEROQUEL Take 1 tablet (50 mg total) by mouth at bedtime. She takes two 25mg  tabs at bedtime   timolol 0.5 % ophthalmic solution Commonly known as:  TIMOPTIC Place 1 drop into both eyes 2 (two) times daily. 7am and 2pm   traMADol 50 MG tablet Commonly known as:  ULTRAM Take 1 tablet (50 mg total) by mouth every 6 (six) hours as needed for moderate pain or severe pain.   verapamil 240 MG CR tablet Commonly known as:  CALAN-SR Take 240 mg by mouth every evening.         DISCHARGE INSTRUCTIONS:   DIET:  Cardiac diet  DISCHARGE CONDITION:  Stable  ACTIVITY:  Activity as tolerated  OXYGEN:  Home Oxygen: No.   Oxygen Delivery: room air  DISCHARGE LOCATION:  home   If you experience worsening of your admission symptoms, develop shortness of breath, life threatening emergency, suicidal or homicidal thoughts you must seek medical attention immediately by calling 911 or calling your MD immediately  if symptoms less severe.  You Must read complete instructions/literature along with all the possible adverse reactions/side effects for all the Medicines you take and that have been prescribed to you. Take any new Medicines after you have completely understood and accpet all the possible adverse reactions/side effects.   Please note  You were cared for by a hospitalist during your hospital stay. If you have any questions about your discharge medications or the care you received while you were in the hospital after you are discharged, you can call  the unit and asked to speak with the hospitalist on call if the hospitalist that took care of you is not available. Once you are discharged, your primary care physician will handle any further medical issues. Please note that NO REFILLS for any discharge medications will be authorized once you are discharged, as it is imperative that you return to your primary care physician (or establish a relationship with a primary care physician if you do not have one) for your aftercare needs so that they can reassess your need for medications and monitor your lab values.     Today   Still having some right-sided flank abdominal pain but improved since yesterday. No further hematuria. Afebrile, hemodynamically stable.  VITAL SIGNS:  Blood pressure (!) 140/56, pulse 81, temperature 98.4 F (36.9 C), temperature source Oral, resp. rate 20, height 5\' 3"  (1.6 m), weight 82 kg (180 lb  12.4 oz), SpO2 97 %.  I/O:   Intake/Output Summary (Last 24 hours) at 02/15/17 1353 Last data filed at 02/15/17 0900  Gross per 24 hour  Intake             1310 ml  Output              900 ml  Net              410 ml    PHYSICAL EXAMINATION:   GENERAL:  71 y.o.-year-old patient lying in bed in no acute distress.  EYES: Pupils equal, round, reactive to light and accommodation. No scleral icterus. Extraocular muscles intact.  HEENT: Head atraumatic, normocephalic. Oropharynx and nasopharynx clear.  NECK:  Supple, no jugular venous distention. No thyroid enlargement, no tenderness.  LUNGS: Normal breath sounds bilaterally, no wheezing, rales, rhonchi. No use of accessory muscles of respiration.  CARDIOVASCULAR: S1, S2 normal. No murmurs, rubs, or gallops.  ABDOMEN: Soft, mildly Tender in the Right Flank but no rebound, rigidity, nondistended. Bowel sounds present. No organomegaly or mass.  EXTREMITIES: No cyanosis, clubbing or edema b/l.    NEUROLOGIC: Cranial nerves II through XII are intact. No focal Motor or sensory  deficits b/l.   PSYCHIATRIC: The patient is alert and oriented x 3.  SKIN: No obvious rash, lesion, or ulcer.    DATA REVIEW:   CBC  Recent Labs Lab 02/14/17 0325  WBC 8.6  HGB 8.2*  HCT 24.2*  PLT 306    Chemistries   Recent Labs Lab 02/14/17 0325  NA 140  K 3.7  CL 114*  CO2 17*  GLUCOSE 101*  BUN 46*  CREATININE 3.91*  CALCIUM 8.4*    Cardiac Enzymes No results for input(s): TROPONINI in the last 168 hours.  Microbiology Results  Results for orders placed or performed during the hospital encounter of 02/12/17  Urine Culture     Status: Abnormal (Preliminary result)   Collection Time: 02/13/17  6:06 PM  Result Value Ref Range Status   Specimen Description URINE, RANDOM  Final   Special Requests NONE  Final   Culture >=100,000 COLONIES/mL ESCHERICHIA COLI (A)  Final   Report Status PENDING  Incomplete    RADIOLOGY:  No results found.    Management plans discussed with the patient, family and they are in agreement.  CODE STATUS:     Code Status Orders        Start     Ordered   02/12/17 1817  Full code  Continuous     02/12/17 1816      TOTAL TIME TAKING CARE OF THIS PATIENT: 40 minutes.    Henreitta Leber M.D on 02/15/2017 at 1:53 PM  Between 7am to 6pm - Pager - [phone]  After 6pm go to [email or web address] - Proofreader  [facility]  Office  [phone]  CC: Primary care physician; Cletis Athens, MD

## 2017-02-15 NOTE — Progress Notes (Signed)
Patient found to have leaking from IV upon going to the restroom.  Linens were changed, patient requested to wait until the AM to remove or restart.

## 2017-02-16 LAB — URINE CULTURE

## 2017-02-19 DIAGNOSIS — N185 Chronic kidney disease, stage 5: Secondary | ICD-10-CM | POA: Diagnosis not present

## 2017-02-19 DIAGNOSIS — D631 Anemia in chronic kidney disease: Secondary | ICD-10-CM | POA: Diagnosis not present

## 2017-02-19 DIAGNOSIS — I10 Essential (primary) hypertension: Secondary | ICD-10-CM | POA: Diagnosis not present

## 2017-02-19 DIAGNOSIS — N2581 Secondary hyperparathyroidism of renal origin: Secondary | ICD-10-CM | POA: Diagnosis not present

## 2017-02-25 DIAGNOSIS — F3177 Bipolar disorder, in partial remission, most recent episode mixed: Secondary | ICD-10-CM | POA: Diagnosis not present

## 2017-02-27 DIAGNOSIS — Z79899 Other long term (current) drug therapy: Secondary | ICD-10-CM | POA: Diagnosis not present

## 2017-03-04 DIAGNOSIS — I10 Essential (primary) hypertension: Secondary | ICD-10-CM | POA: Diagnosis not present

## 2017-03-04 DIAGNOSIS — D631 Anemia in chronic kidney disease: Secondary | ICD-10-CM | POA: Diagnosis not present

## 2017-03-04 DIAGNOSIS — N186 End stage renal disease: Secondary | ICD-10-CM | POA: Diagnosis not present

## 2017-03-04 DIAGNOSIS — Q612 Polycystic kidney, adult type: Secondary | ICD-10-CM | POA: Diagnosis not present

## 2017-03-04 DIAGNOSIS — E559 Vitamin D deficiency, unspecified: Secondary | ICD-10-CM | POA: Diagnosis not present

## 2017-03-04 DIAGNOSIS — N2581 Secondary hyperparathyroidism of renal origin: Secondary | ICD-10-CM | POA: Diagnosis not present

## 2017-03-04 DIAGNOSIS — N185 Chronic kidney disease, stage 5: Secondary | ICD-10-CM | POA: Diagnosis not present

## 2017-03-04 DIAGNOSIS — Q613 Polycystic kidney, unspecified: Secondary | ICD-10-CM | POA: Diagnosis not present

## 2017-03-10 DIAGNOSIS — N184 Chronic kidney disease, stage 4 (severe): Secondary | ICD-10-CM | POA: Diagnosis not present

## 2017-03-10 DIAGNOSIS — K219 Gastro-esophageal reflux disease without esophagitis: Secondary | ICD-10-CM | POA: Diagnosis not present

## 2017-03-10 DIAGNOSIS — R109 Unspecified abdominal pain: Secondary | ICD-10-CM | POA: Diagnosis not present

## 2017-03-10 DIAGNOSIS — E669 Obesity, unspecified: Secondary | ICD-10-CM | POA: Diagnosis not present

## 2017-03-11 ENCOUNTER — Emergency Department: Payer: Medicare HMO

## 2017-03-11 ENCOUNTER — Emergency Department
Admission: EM | Admit: 2017-03-11 | Discharge: 2017-03-11 | Disposition: A | Discharge status: Home / Self Care | Payer: Medicare HMO | Attending: Emergency Medicine | Admitting: Emergency Medicine

## 2017-03-11 DIAGNOSIS — I129 Hypertensive chronic kidney disease with stage 1 through stage 4 chronic kidney disease, or unspecified chronic kidney disease: Secondary | ICD-10-CM | POA: Diagnosis not present

## 2017-03-11 DIAGNOSIS — M79661 Pain in right lower leg: Secondary | ICD-10-CM | POA: Diagnosis not present

## 2017-03-11 DIAGNOSIS — K5900 Constipation, unspecified: Secondary | ICD-10-CM | POA: Diagnosis not present

## 2017-03-11 DIAGNOSIS — N184 Chronic kidney disease, stage 4 (severe): Secondary | ICD-10-CM | POA: Diagnosis not present

## 2017-03-11 DIAGNOSIS — M79604 Pain in right leg: Secondary | ICD-10-CM | POA: Insufficient documentation

## 2017-03-11 DIAGNOSIS — R1031 Right lower quadrant pain: Secondary | ICD-10-CM | POA: Insufficient documentation

## 2017-03-11 DIAGNOSIS — N3000 Acute cystitis without hematuria: Secondary | ICD-10-CM | POA: Diagnosis not present

## 2017-03-11 DIAGNOSIS — N185 Chronic kidney disease, stage 5: Secondary | ICD-10-CM | POA: Diagnosis not present

## 2017-03-11 LAB — BASIC METABOLIC PANEL
Anion gap: 10 (ref 5–15)
BUN: 52 mg/dL — AB (ref 6–20)
CALCIUM: 9.2 mg/dL (ref 8.9–10.3)
CO2: 23 mmol/L (ref 22–32)
Chloride: 105 mmol/L (ref 101–111)
Creatinine, Ser: 4.27 mg/dL — ABNORMAL HIGH (ref 0.44–1.00)
GFR calc Af Amer: 11 mL/min — ABNORMAL LOW (ref >60)
GFR, EST NON AFRICAN AMERICAN: 10 mL/min — AB (ref >60)
GLUCOSE: 116 mg/dL — AB (ref 65–99)
Potassium: 4.7 mmol/L (ref 3.5–5.1)
Sodium: 138 mmol/L (ref 135–145)

## 2017-03-11 LAB — CBC
HEMATOCRIT: 28.9 % — AB (ref 35.0–47.0)
Hemoglobin: 10 g/dL — ABNORMAL LOW (ref 12.0–16.0)
MCH: 30.7 pg (ref 26.0–34.0)
MCHC: 34.4 g/dL (ref 32.0–36.0)
MCV: 89.1 fL (ref 80.0–100.0)
Platelets: 332 10*3/uL (ref 150–440)
RBC: 3.25 MIL/uL — ABNORMAL LOW (ref 3.80–5.20)
RDW: 14.7 % — AB (ref 11.5–14.5)
WBC: 5.7 10*3/uL (ref 3.6–11.0)

## 2017-03-11 LAB — URINALYSIS, COMPLETE (UACMP) WITH MICROSCOPIC
Bilirubin Urine: NEGATIVE
GLUCOSE, UA: NEGATIVE mg/dL
KETONES UR: NEGATIVE mg/dL
NITRITE: NEGATIVE
PROTEIN: 30 mg/dL — AB
Specific Gravity, Urine: 1.01 (ref 1.005–1.030)
pH: 5 (ref 5.0–8.0)

## 2017-03-11 MED ORDER — DEXTROSE 5 % IV SOLN
1.0000 g | Freq: Once | INTRAVENOUS | Status: AC
  Administered 2017-03-11: 1 g via INTRAVENOUS
  Filled 2017-03-11: qty 10

## 2017-03-11 MED ORDER — ONDANSETRON HCL 4 MG/2ML IJ SOLN
4.0000 mg | Freq: Once | INTRAMUSCULAR | Status: AC
  Administered 2017-03-11: 4 mg via INTRAVENOUS
  Filled 2017-03-11: qty 2

## 2017-03-11 MED ORDER — FENTANYL CITRATE (PF) 100 MCG/2ML IJ SOLN
100.0000 ug | Freq: Once | INTRAMUSCULAR | Status: AC
  Administered 2017-03-11: 100 ug via INTRAVENOUS
  Filled 2017-03-11: qty 2

## 2017-03-11 MED ORDER — SODIUM CHLORIDE 0.9 % IV BOLUS (SEPSIS)
1000.0000 mL | Freq: Once | INTRAVENOUS | Status: AC
  Administered 2017-03-11: 1000 mL via INTRAVENOUS

## 2017-03-11 MED ORDER — CEPHALEXIN 500 MG PO CAPS: 500.0000 mg | ORAL_CAPSULE | Freq: Four times a day (QID) | ORAL | 0 refills | Status: AC

## 2017-03-11 NOTE — ED Notes (Signed)
Pt discharged to home.  Friend driving.  Discharge instructions reviewed.  Verbalized understanding.  No questions or concerns at this time.  Teach back verified.  Pt in NAD.  No items left in ED.   

## 2017-03-11 NOTE — ED Notes (Signed)
Per patient, she is a patient of Dr. Holley Raring and has been having pain in her R kidney and up her R leg.  Pt states she saw her PCP yesterday for this pain.  Pt states she could not get ahold of anyone at Dr. Elwyn Lade office.  Pt states she called a nurse's hotline and they told her to come to the ER.  Pt very vague on details of pain.  Pt also states that she doesn't think that she has had a bowel movement since Saturday, but only states that she definitely knows that she tried to have one last night and couldn't.  Pt also states that she knows that she has a UTI.  Pt states that they have not started her on HD because she is waiting on the "center in [place]" to sign off on her approval for dialysis.

## 2017-03-11 NOTE — ED Triage Notes (Signed)
Pt here with c/o worsening right leg pain over the past month, states she is stage 5 renal disease and her pain presents in upper right leg and travels down. Pedal pulse present. Pt states she is constipated as well, no BM since Saturday. Appears pale, in no distress at this time.

## 2017-03-11 NOTE — Discharge Instructions (Signed)
For your constipation, please eat a high-fiber diet and start using stool softener that you have at home.  For your urinary tract infection, please take the entire course of antibiotics, even if you're feeling better. Please drink plenty of fluid to stay well-hydrated. You can have your doctor follow up the results of your urinary culture.  Please return to the emergency department if you develop severe pain, fever, vomiting, lightheadedness or fainting, or any other symptoms concerning to you

## 2017-03-11 NOTE — ED Triage Notes (Signed)
Pedal pulses present and strong.

## 2017-03-11 NOTE — ED Notes (Signed)
Pt instructed to call for a ride due to getting a narcotic.  Pt educated on policy regarding this.  Pt states she will call for a ride.

## 2017-03-11 NOTE — ED Provider Notes (Signed)
[facility] Emergency Department Provider Note  ____________________________________________  Time seen: Approximately 12:36 PM  I have reviewed the triage vital signs and the nursing notes.   HISTORY  Chief Complaint Leg Pain    HPI Joyce [name] is a 71 y.o. female w/ a hx of polycystic kidney d/o, recurrent UTI, presenting with right lower quadrant pain, constipation, and right leg pain. The patient reports that she was hospitalized for hemorrhagic cystic rupture of the kidney on the right side 02/01/17. When she arrived home, she began to have right lower extremity pain radiating from the right lower quadrant all the way to the ankle. This pain is always worse at night and she feels like she cannot keep her leg still. She has spoken to her primary care physician, thinks it may be restless leg syndrome but has not started her on any medications. She denies any associated trauma, swelling of the joints, or edema. In addition, over the past 5 days the patient notes that she has not had a bowel movement. She has had right lower quadrant and flank pain without dysuria or hematuria. No nausea or vomiting, fever or chills.   Past Medical History:  Diagnosis Date  . Anal fissure   . Bipolar affective disorder ([place])   . CKD (chronic kidney disease)    Dr Holley Raring [name] 4  . Colon polyps   . Diverticulitis   . Diverticulitis   . Family history of adverse reaction to anesthesia    mom - PONV  . GERD (gastroesophageal reflux disease)   . Headache    migraines - none over 10 yrs  . Heart murmur   . History of hiatal hernia   . Hypertension   . Pancreatitis    Valproic acid  . Vertigo     Patient Active Problem List   Diagnosis Date Noted  . Hematuria 12/18/2016  . Polycystic kidney 10/12/2016  . Ruptured cyst of kidney 10/03/2016  . HTN (hypertension) 10/01/2016  . Chronic kidney disease 10/01/2016  . Anemia associated with chronic renal failure  09/24/2016  . Acute kidney insufficiency 07/28/2016  . Noninfectious diarrhea   . Benign neoplasm of cecum   . Benign neoplasm of ascending colon   . Diarrhea   . Nausea   . Gastric polyp   . Chronic constipation 12/27/2014  . Nausea with vomiting 12/27/2014  . Left sided abdominal pain 12/27/2014  . Personal history of colonic polyps 05/10/2013    Past Surgical History:  Procedure Laterality Date  . ABDOMINAL HYSTERECTOMY  1990 ?  . AV FISTULA PLACEMENT  4/30  . BREAST EXCISIONAL BIOPSY Left 1994   neg surgical bx  . CHOLECYSTECTOMY  2003  . COLONOSCOPY  2014   Dr. Jamal Collin  . COLONOSCOPY WITH PROPOFOL N/A 09/24/2015   Procedure: COLONOSCOPY WITH random colon byopies.;  Surgeon: Lucilla Lame, MD;  Location: [facility];  Service: Endoscopy;  Laterality: N/A;  . ESOPHAGOGASTRODUODENOSCOPY (EGD) WITH PROPOFOL N/A 09/24/2015   Procedure: ESOPHAGOGASTRODUODENOSCOPY (EGD) ;  Surgeon: Lucilla Lame, MD;  Location: [facility];  Service: Endoscopy;  Laterality: N/A;  . EYE SURGERY    . PERIPHERAL VASCULAR CATHETERIZATION N/A 05/29/2015   Procedure: A/V Shuntogram/Fistulagram;  Surgeon: Katha Cabal, MD;  Location: [place];  Service: Cardiovascular;  Laterality: N/A;  . PERIPHERAL VASCULAR CATHETERIZATION N/A 05/29/2015   Procedure: A/V Shunt Intervention;  Surgeon: Katha Cabal, MD;  Location: [place] [facility];  Service: Cardiovascular;  Laterality: N/A;  .  POLYPECTOMY  09/24/2015   Procedure: POLYPECTOMY INTESTINAL;  Surgeon: Lucilla Lame, MD;  Location: [facility];  Service: Endoscopy;;  cecal polyp ascending polyp    Current Outpatient Rx  . Order #: [id number] Class: Historical Med  . Order #: [id number] Class: Normal  . Order #: [id number] Class: Historical Med  . Order #: [id number] Class: Historical Med  . Order #: [id number] Class: Historical Med  . Order #: [id number] Class: Historical Med  . Order #: [id number] Class: Historical Med  . Order #:  [id number] Class: Normal  . Order #: [id number] Class: Historical Med  . Order #: [id number] Class: Historical Med  . Order #: [id number] Class: Historical Med  . Order #: [id number] Class: Normal  . Order #: [id number] Class: Print  . Order #: [id number] Class: Historical Med  . Order #: [id number] Class: Historical Med  . Order #: [id number] Class: Print  . Order #: [id number] Class: Normal  . Order #: [id number] Class: Historical Med  . Order #: [id number] Class: Print    Allergies Morphine and related; Indomethacin; and Pollen extract  Family History  Problem Relation Age of Onset  . Stroke Father   . Hypertension Father   . Breast cancer Other   . Colon cancer Neg Hx   . Liver disease Neg Hx     Social History Social History  Substance Use Topics  . Smoking status: Never Smoker  . Smokeless tobacco: Never Used  . Alcohol use No    Review of Systems Constitutional: No fever/chills. No lightheadedness or syncope. Eyes: No visual changes. ENT: No sore throat. No congestion or rhinorrhea. Cardiovascular: Denies chest pain. Denies palpitations. Respiratory: Denies shortness of breath.  No cough. Gastrointestinal: Overweight. Positive right lower quadrant abdominal pain.  No nausea, no vomiting.  No diarrhea.  No constipation. Genitourinary: Negative for dysuria. Musculoskeletal: Negative for back pain. Positive right lower extremity pain  Skin: Negative for rash. Neurological: Negative for headaches. No focal numbness, tingling or weakness.     ____________________________________________   PHYSICAL EXAM:  VITAL SIGNS: ED Triage Vitals  Enc Vitals Group     BP 03/11/17 1044 127/62     Pulse Rate 03/11/17 1038 64     Resp 03/11/17 1038 16     Temp 03/11/17 1038 98.2 F (36.8 C)     Temp Source 03/11/17 1038 Oral     SpO2 03/11/17 1038 100 %     Weight 03/11/17 1038 172 lb (78 kg)     Height 03/11/17 1038 5\' 3"  (1.6 m)     Head Circumference --      Peak Flow --      Pain  Score 03/11/17 1037 8     Pain Loc --      Pain Edu? --      Excl. in [place]? --     Constitutional: Alert and oriented. Chronically ill appearing and in no acute distress. Answers questions appropriately. Eyes: Conjunctivae are normal.  EOMI. No scleral icterus. Head: Atraumatic. Nose: No congestion/rhinnorhea. Mouth/Throat: Mucous membranes are moist.  Neck: No stridor.  Supple.   Cardiovascular: Normal rate, regular rhythm. No murmurs, rubs or gallops.  Respiratory: Normal respiratory effort.  No accessory muscle use or retractions. Lungs CTAB.  No wheezes, rales or ronchi. Gastrointestinal: Soft, and nondistended.  Tender to palpation in the right lower quadrant. No guarding or rebound.  No peritoneal signs. Musculoskeletal: Full range of motion of the right hip, knee, ankle without pain. No evidence of effusion in any of the right lower extremity joints. Normal DP and PT pulse on the right. Normal  sensation to light touch. 5 out of 5 hip flexor, quadriceps and hamstring strength in the right lower extremity. Skin is intact without any rash or lesions. No LE edema. No ttp in the calves or palpable cords.  Negative Homan's sign. Neurologic:  A&Ox3.  Speech is clear.  Face and smile are symmetric.  EOMI.  Moves all extremities well. Skin:  Skin is warm, dry and intact. No rash noted. Psychiatric: Mood and affect are normal. Speech and behavior are normal.  Normal judgement.  ____________________________________________   LABS (all labs ordered are listed, but only abnormal results are displayed)  Labs Reviewed  URINALYSIS, COMPLETE (UACMP) WITH MICROSCOPIC - Abnormal; Notable for the following:       Result Value   Color, Urine YELLOW (*)    APPearance HAZY (*)    Hgb urine dipstick SMALL (*)    Protein, ur 30 (*)    Leukocytes, UA LARGE (*)    Bacteria, UA RARE (*)    Squamous Epithelial / LPF 0-5 (*)    All other components within normal limits  BASIC METABOLIC PANEL - Abnormal;  Notable for the following:    Glucose, Bld 116 (*)    BUN 52 (*)    Creatinine, Ser 4.27 (*)    GFR calc non Af Amer 10 (*)    GFR calc Af Amer 11 (*)    All other components within normal limits  CBC - Abnormal; Notable for the following:    RBC 3.25 (*)    Hemoglobin 10.0 (*)    HCT 28.9 (*)    RDW 14.7 (*)    All other components within normal limits  URINE CULTURE   ____________________________________________  EKG  ED ECG REPORT I, Eula Listen, the attending physician, personally viewed and interpreted this ECG.   Date: 03/11/2017  EKG Time: 1416  Rate: 65  Rhythm: normal sinus rhythm  Axis: leftward  Intervals:none  ST&T Change: No STEMI  ____________________________________________  RADIOLOGY  US Venous Img Lower Unilateral Right  Result Date: 03/11/2017 CLINICAL DATA:  Right lower extremity pain for 1 month EXAM: RIGHT LOWER EXTREMITY VENOUS DOPPLER ULTRASOUND TECHNIQUE: Gray-scale sonography with graded compression, as well as color Doppler and duplex ultrasound were performed to evaluate the lower extremity deep venous systems from the level of the common femoral vein and including the common femoral, femoral, profunda femoral, popliteal and calf veins including the posterior tibial, peroneal and gastrocnemius veins when visible. The superficial great saphenous vein was also interrogated. Spectral Doppler was utilized to evaluate flow at rest and with distal augmentation maneuvers in the common femoral, femoral and popliteal veins. COMPARISON:  None. FINDINGS: Contralateral Common Femoral Vein: Respiratory phasicity is normal and symmetric with the symptomatic side. No evidence of thrombus. Normal compressibility. Common Femoral Vein: No evidence of thrombus. Saphenofemoral Junction: No evidence of thrombus. Profunda Femoral Vein: No evidence of thrombus. Femoral Vein: No evidence of thrombus. Popliteal Vein: No evidence of thrombus. Calf Veins: No evidence of  thrombus. Superficial Great Saphenous Vein: No evidence of thrombus. IMPRESSION: No evidence of DVT within the right lower extremity. Electronically Signed   By: Monte Fantasia M.D.   On: 03/11/2017 13:32   Ct Renal Stone Study  Result Date: 03/11/2017 CLINICAL DATA:  Pt here with c/o worsening right leg pain over the past month, states she is stage 5 renal disease and her pain presents in upper right leg and travels down. Pedal pulse present. Pt states she is constipated as well, no  BM since Sunday * EXAM: CT ABDOMEN AND PELVIS WITHOUT CONTRAST TECHNIQUE: Multidetector CT imaging of the abdomen and pelvis was performed following the standard protocol without IV contrast. COMPARISON:  02/09/2017 FINDINGS: Lower chest: Lung bases are clear. Hepatobiliary: No focal hepatic lesion. Postcholecystectomy. No biliary dilatation. Common bile duct is dilated at 14 mm which compares to 13 mm. Dilatation extends through the pancreatic head the duct measures 7 mm similar to 7 mm on prior. No pancreatic duct dilatation. Duct dilatation is also similar to CT of 03/04/2013. Pancreas: Pancreas is normal. No ductal dilatation. No pancreatic inflammation. Spleen: Normal spleen Adrenals/urinary tract: Adrenal glands normal. The renal parenchyma is near completely replaced by multiple simple fluid attenuation cysts. No IV contrast administered. The presumed ruptured cyst described on CT 02/09/2017 is contracted in interval measuring 4.1 x 3.0 cm decreased from 6.7 by 5.3 cm No ureterolithiasis or obstructive uropathy no bladder calculi Stomach/Bowel: Stomach, small-bowel cecum are normal. Appendix not identified. Ascending transverse colon normal. Struck diverticular the descending colon sigmoid colon without inflammation. Rectum normal Vascular/Lymphatic: Abdominal aorta is normal caliber with atherosclerotic calcification. There is no retroperitoneal or periportal lymphadenopathy. No pelvic lymphadenopathy. Reproductive: Post  hysterectomy. Low-density 3 cm cyst associated with the LEFT ovary minimally enlarged from 2.4 cm on 03/04/2013. Other: No inguinal hernia or ventral hernia. Musculoskeletal: No aggressive osseous lesion. IMPRESSION: 1. Contraction of the complex/ruptured cyst in the midpole RIGHT kidney compare to 02/09/2017. 2. Multi cystic change of the kidneys. No ureterolithiasis or obstructive uropathy. 3. Stable benign-appearing cyst of the LEFT adnexa. 4. Post hysterectomy and cholecystectomy. 5. Chronic dilatation the common bile duct not changed significantly from 03/04/2013 presumably relates to prior cholecystectomy. Electronically Signed   By: Suzy Bouchard M.D.   On: 03/11/2017 13:12    ____________________________________________   PROCEDURES  Procedure(s) performed: None  Procedures  Critical Care performed: No ____________________________________________   INITIAL IMPRESSION / ASSESSMENT AND PLAN / ED COURSE  Pertinent labs & imaging results that were available during my care of the patient were reviewed by me and considered in my medical decision making (see chart for details).  71 y.o. female with a history of polycystic kidney disease presenting with right lower quadrant pain, constipation and right lower extremity pain. The right lower extremity pain history is suggestive of a chronic process, including restless leg syndrome. Do not see any evidence of acute injury including bony fracture, or infection in the joints. I would consider sciatica although this is atypical for that. For the patient's right lower quadrant pain, we'll get a CT scan for evaluation of cystic rupture, obstruction, or appendix pathology. The patient will be treated symptomatically, and we will reevaluate the patient for final disposition. Her urinalysis does show UTI, which could contribute to her symptoms, and she'll be treated based on prior microbiology results with Rocephin and  fluids.  ----------------------------------------- 3:19 PM on 03/11/2017 -----------------------------------------  The patient's workup in the emergency department has been reassuring. Her CT scan does not show any acute process; in fact, her previous cyst has improved. Her ultrasound does not show a DVT in the right lower extremity. I have spoken with the patient about outpatient antibiotic treatment for UTI; she has received a dose of intravenous antibiotics here. At this time, the patient is safe for discharge to follow up with her primary care physician tomorrow. Return precautions as well as follow-up instructions were discussed.  ____________________________________________  FINAL CLINICAL IMPRESSION(S) / ED DIAGNOSES  Final diagnoses:  Right leg pain  Right lower quadrant pain  Acute cystitis without hematuria  Constipation, unspecified constipation type         NEW MEDICATIONS STARTED DURING THIS VISIT:  New Prescriptions   CEPHALEXIN (KEFLEX) 500 MG CAPSULE    Take 1 capsule (500 mg total) by mouth 4 (four) times daily.      Eula Listen, MD 03/11/17 1520

## 2017-03-11 NOTE — ED Notes (Signed)
Pt to ultrasound at this time.

## 2017-03-13 LAB — URINE CULTURE: Culture: >=100000 — AB

## 2017-03-17 DIAGNOSIS — E669 Obesity, unspecified: Secondary | ICD-10-CM | POA: Diagnosis not present

## 2017-03-17 DIAGNOSIS — N184 Chronic kidney disease, stage 4 (severe): Secondary | ICD-10-CM | POA: Diagnosis not present

## 2017-03-17 DIAGNOSIS — Z8659 Personal history of other mental and behavioral disorders: Secondary | ICD-10-CM | POA: Diagnosis not present

## 2017-03-17 DIAGNOSIS — I10 Essential (primary) hypertension: Secondary | ICD-10-CM | POA: Diagnosis not present

## 2017-03-30 DIAGNOSIS — I10 Essential (primary) hypertension: Secondary | ICD-10-CM | POA: Diagnosis not present

## 2017-03-30 DIAGNOSIS — N186 End stage renal disease: Secondary | ICD-10-CM | POA: Diagnosis not present

## 2017-03-30 DIAGNOSIS — D631 Anemia in chronic kidney disease: Secondary | ICD-10-CM | POA: Diagnosis not present

## 2017-03-30 DIAGNOSIS — N2581 Secondary hyperparathyroidism of renal origin: Secondary | ICD-10-CM | POA: Diagnosis not present

## 2017-04-06 ENCOUNTER — Encounter (INDEPENDENT_AMBULATORY_CARE_PROVIDER_SITE_OTHER): Payer: Self-pay | Admitting: Vascular Surgery

## 2017-04-06 ENCOUNTER — Ambulatory Visit (INDEPENDENT_AMBULATORY_CARE_PROVIDER_SITE_OTHER): Payer: Medicare HMO | Admitting: Vascular Surgery

## 2017-04-06 ENCOUNTER — Ambulatory Visit (INDEPENDENT_AMBULATORY_CARE_PROVIDER_SITE_OTHER): Payer: Medicare HMO

## 2017-04-06 ENCOUNTER — Encounter (INDEPENDENT_AMBULATORY_CARE_PROVIDER_SITE_OTHER): Payer: Self-pay

## 2017-04-06 ENCOUNTER — Other Ambulatory Visit (INDEPENDENT_AMBULATORY_CARE_PROVIDER_SITE_OTHER): Payer: Self-pay | Admitting: Vascular Surgery

## 2017-04-06 VITALS — BP 142/60 | HR 70 | Resp 16 | Ht 63.0 in | Wt 173.0 lb

## 2017-04-06 DIAGNOSIS — F3177 Bipolar disorder, in partial remission, most recent episode mixed: Secondary | ICD-10-CM | POA: Diagnosis not present

## 2017-04-06 DIAGNOSIS — T829XXS Unspecified complication of cardiac and vascular prosthetic device, implant and graft, sequela: Secondary | ICD-10-CM | POA: Diagnosis not present

## 2017-04-06 DIAGNOSIS — N185 Chronic kidney disease, stage 5: Secondary | ICD-10-CM | POA: Diagnosis not present

## 2017-04-06 DIAGNOSIS — I159 Secondary hypertension, unspecified: Secondary | ICD-10-CM | POA: Diagnosis not present

## 2017-04-06 DIAGNOSIS — Z992 Dependence on renal dialysis: Secondary | ICD-10-CM

## 2017-04-06 DIAGNOSIS — N189 Chronic kidney disease, unspecified: Secondary | ICD-10-CM | POA: Diagnosis not present

## 2017-04-06 DIAGNOSIS — T829XXA Unspecified complication of cardiac and vascular prosthetic device, implant and graft, initial encounter: Secondary | ICD-10-CM | POA: Insufficient documentation

## 2017-04-06 DIAGNOSIS — N186 End stage renal disease: Secondary | ICD-10-CM | POA: Insufficient documentation

## 2017-04-06 NOTE — Progress Notes (Signed)
MRN : [id number]  Joyce [name] is a 71 y.o. ([birth year]) female who presents with chief complaint of  Chief Complaint  Patient presents with  . Follow-up    HDA ultrasound  .  History of Present Illness:The patient returns to the office for followup of their dialysis access. The function of the access has been stable. The patient is not yet on dialysis but is due to start as soon as the [facility] in [place] opens. The patient denies hand pain or other symptoms consistent with steal phenomena.  No significant arm swelling.  The patient denies redness or swelling at the access site. The patient denies fever or chills at home.  The patient denies amaurosis fugax or recent TIA symptoms. There are no recent neurological changes noted. The patient denies claudication symptoms or rest pain symptoms. The patient denies history of DVT, PE or superficial thrombophlebitis. The patient denies recent episodes of angina or shortness of breath.   Duplex ultrasound of the AV access shows a patent access.  The previously noted stenosis is significantly worse compared to last study.       Current Meds  Medication Sig  . acetaminophen (TYLENOL) 325 MG tablet Take 325 mg by mouth every 8 (eight) hours as needed.  . bimatoprost (LUMIGAN) 0.03 % ophthalmic solution Place 1 drop into both eyes at bedtime.  . bisacodyl (DULCOLAX) 5 MG EC tablet Take 1 tablet (5 mg total) by mouth daily as needed for moderate constipation.  . calcitRIOL (ROCALTROL) 0.25 MCG capsule Take 0.25 mcg by mouth daily.  . carbamazepine (TEGRETOL XR) 200 MG 12 hr tablet Take 200 mg by mouth 2 (two) times daily.  . cholecalciferol (VITAMIN D) 1000 UNITS tablet Take 1,000 Units by mouth daily.  . dorzolamide-timolol (COSOPT) 22.3-6.8 MG/ML ophthalmic solution Place 1 drop into both eyes 2 (two) times daily. 7am and 2pm  . pantoprazole (PROTONIX) 40 MG tablet TAKE 1 TABLET EVERY DAY  . QUEtiapine (SEROQUEL) 25 MG tablet Take 2  tablets by mouth at bedtime.   . timolol (TIMOPTIC) 0.5 % ophthalmic solution Place 1 drop into both eyes 2 (two) times daily. 7am and 2pm  . verapamil (CALAN-SR) 240 MG CR tablet Take 240 mg by mouth every evening.     Past Medical History:  Diagnosis Date  . Anal fissure   . Bipolar affective disorder ([place])   . CKD (chronic kidney disease)    Dr Holley Raring [name] [facility]  . Colon polyps   . Diverticulitis   . Diverticulitis   . Family history of adverse reaction to anesthesia    mom - PONV  . GERD (gastroesophageal reflux disease)   . Headache    migraines - none over 10 yrs  . Heart murmur   . History of hiatal hernia   . Hypertension   . Pancreatitis    Valproic acid  . Vertigo     Past Surgical History:  Procedure Laterality Date  . ABDOMINAL HYSTERECTOMY  1990 ?  . AV FISTULA PLACEMENT  4/30  . BREAST EXCISIONAL BIOPSY Left 1994   neg surgical bx  . CHOLECYSTECTOMY  2003  . COLONOSCOPY  2014   Dr. Jamal Collin  . COLONOSCOPY WITH PROPOFOL N/A 09/24/2015   Procedure: COLONOSCOPY WITH random colon byopies.;  Surgeon: Lucilla Lame, MD;  Location: [facility];  Service: Endoscopy;  Laterality: N/A;  . ESOPHAGOGASTRODUODENOSCOPY (EGD) WITH PROPOFOL N/A 09/24/2015   Procedure: ESOPHAGOGASTRODUODENOSCOPY (EGD) ;  Surgeon: Lucilla Lame, MD;  Location[facility];  Service: Endoscopy;  Laterality: N/A;  . EYE SURGERY    . PERIPHERAL VASCULAR CATHETERIZATION N/A 05/29/2015   Procedure: A/V Shuntogram/Fistulagram;  Surgeon: Katha Cabal, MD;  Location: [facility];  Service: Cardiovascular;  Laterality: N/A;  . PERIPHERAL VASCULAR CATHETERIZATION N/A 05/29/2015   Procedure: A/V Shunt Intervention;  Surgeon: Katha Cabal, MD;  Location: [facility];  Service: Cardiovascular;  Laterality: N/A;  . POLYPECTOMY  09/24/2015   Procedure: POLYPECTOMY INTESTINAL;  Surgeon: Lucilla Lame, MD;  Location: [facility];  Service: Endoscopy;;  cecal polyp ascending  polyp    Social History Social History  Substance Use Topics  . Smoking status: Never Smoker  . Smokeless tobacco: Never Used  . Alcohol use No    Family History Family History  Problem Relation Age of Onset  . Stroke Father   . Hypertension Father   . Breast cancer Other   . Colon cancer Neg Hx   . Liver disease Neg Hx     Allergies  Allergen Reactions  . Morphine And Related Shortness Of Breath    Pt reports chest pain and difficulty breathing.  . Indomethacin Hives  . Pollen Extract Other (See Comments)    Sinus problems and HA     REVIEW OF SYSTEMS (Negative unless checked)  Constitutional: [] Weight loss  [] Fever  [] Chills Cardiac: [] Chest pain   [] Chest pressure   [] Palpitations   [] Shortness of breath when laying flat   [] Shortness of breath with exertion. Vascular:  [] Pain in legs with walking   [] Pain in legs at rest  [] History of DVT   [] Phlebitis   [] Swelling in legs   [] Varicose veins   [] Non-healing ulcers Pulmonary:   [] Uses home oxygen   [] Productive cough   [] Hemoptysis   [] Wheeze  [] COPD   [] Asthma Neurologic:  [] Dizziness   [] Seizures   [] History of stroke   [] History of TIA  [] Aphasia   [] Vissual changes   [] Weakness or numbness in arm   [] Weakness or numbness in leg Musculoskeletal:   [] Joint swelling   [] Joint pain   [] Low back pain Hematologic:  [x] Easy bruising  [] Easy bleeding   [] Hypercoagulable state   [] Anemic Gastrointestinal:  [] Diarrhea   [x] Vomiting  [] Gastroesophageal reflux/heartburn   [] Difficulty swallowing. Genitourinary:  [x] Chronic kidney disease   [] Difficult urination  [] Frequent urination   [] Blood in urine Skin:  [] Rashes   [] Ulcers  Psychological:  [] History of anxiety   []  History of major depression.  Physical Examination  Vitals:   04/06/17 0922  BP: (!) 142/60  Pulse: 70  Resp: 16  Weight: 173 lb (78.5 kg)  Height: 5\' 3"  (1.6 m)   Body mass index is 30.65 kg/m. Gen: WD/WN, NAD Head: [place]/AT, No temporalis wasting.    Ear/Nose/Throat: Hearing grossly intact, nares w/o erythema or drainage Eyes: PER, EOMI, sclera nonicteric.  Neck: Supple, no large masses.   Pulmonary:  Good air movement, no audible wheezing bilaterally, no use of accessory muscles.  Cardiac: RRR, no JVD Vascular: left brachial cephalic fistula pulsatile  Vessel Right Left  Radial Palpable Palpable  Ulnar Palpable Palpable  Brachial Palpable Palpable  Gastrointestinal: Non-distended. No guarding/no peritoneal signs.  Musculoskeletal: M/S 5/5 throughout.  No deformity or atrophy.  Neurologic: CN 2-12 intact. Symmetrical.  Speech is fluent. Motor exam as listed above. Psychiatric: Judgment intact, Mood & affect appropriate for pt's clinical situation. Dermatologic: No rashes or ulcers noted.  No changes consistent with cellulitis. Lymph : No  lichenification or skin changes of chronic lymphedema.  CBC Lab Results  Component Value Date   WBC 5.7 03/11/2017   HGB 10.0 (L) 03/11/2017   HCT 28.9 (L) 03/11/2017   MCV 89.1 03/11/2017   PLT 332 03/11/2017    BMET    Component Value Date/Time   NA 138 03/11/2017 1042   NA 139 11/09/2014 1027   K 4.7 03/11/2017 1042   K 4.7 11/09/2014 1027   CL 105 03/11/2017 1042   CL 108 11/09/2014 1027   CO2 23 03/11/2017 1042   CO2 25 11/09/2014 1027   GLUCOSE 116 (H) 03/11/2017 1042   GLUCOSE 100 (H) 11/09/2014 1027   BUN 52 (H) 03/11/2017 1042   BUN 51 (H) 11/09/2014 1027   CREATININE 4.27 (H) 03/11/2017 1042   CREATININE 2.98 (H) 11/09/2014 1027   CALCIUM 9.2 03/11/2017 1042   CALCIUM 8.9 11/09/2014 1027   GFRNONAA 10 (L) 03/11/2017 1042   GFRNONAA 15 (L) 11/09/2014 1027   GFRAA 11 (L) 03/11/2017 1042   GFRAA 18 (L) 11/09/2014 1027   CrCl cannot be calculated (Patient's most recent lab result is older than the maximum 21 days allowed.).  COAG Lab Results  Component Value Date   INR 1.08 10/03/2016   INR 1.0 11/09/2014    Radiology US Venous Img Lower Unilateral  Right  Result Date: 03/11/2017 CLINICAL DATA:  Right lower extremity pain for 1 month EXAM: RIGHT LOWER EXTREMITY VENOUS DOPPLER ULTRASOUND TECHNIQUE: Gray-scale sonography with graded compression, as well as color Doppler and duplex ultrasound were performed to evaluate the lower extremity deep venous systems from the level of the common femoral vein and including the common femoral, femoral, profunda femoral, popliteal and calf veins including the posterior tibial, peroneal and gastrocnemius veins when visible. The superficial great saphenous vein was also interrogated. Spectral Doppler was utilized to evaluate flow at rest and with distal augmentation maneuvers in the common femoral, femoral and popliteal veins. COMPARISON:  None. FINDINGS: Contralateral Common Femoral Vein: Respiratory phasicity is normal and symmetric with the symptomatic side. No evidence of thrombus. Normal compressibility. Common Femoral Vein: No evidence of thrombus. Saphenofemoral Junction: No evidence of thrombus. Profunda Femoral Vein: No evidence of thrombus. Femoral Vein: No evidence of thrombus. Popliteal Vein: No evidence of thrombus. Calf Veins: No evidence of thrombus. Superficial Great Saphenous Vein: No evidence of thrombus. IMPRESSION: No evidence of DVT within the right lower extremity. Electronically Signed   By: Monte Fantasia M.D.   On: 03/11/2017 13:32   Ct Renal Stone Study  Result Date: 03/11/2017 CLINICAL DATA:  Pt here with c/o worsening right leg pain over the past month, states she is stage 5 renal disease and her pain presents in upper right leg and travels down. Pedal pulse present. Pt states she is constipated as well, no BM since Sunday * EXAM: CT ABDOMEN AND PELVIS WITHOUT CONTRAST TECHNIQUE: Multidetector CT imaging of the abdomen and pelvis was performed following the standard protocol without IV contrast. COMPARISON:  02/09/2017 FINDINGS: Lower chest: Lung bases are clear. Hepatobiliary: No focal  hepatic lesion. Postcholecystectomy. No biliary dilatation. Common bile duct is dilated at 14 mm which compares to 13 mm. Dilatation extends through the pancreatic head the duct measures 7 mm similar to 7 mm on prior. No pancreatic duct dilatation. Duct dilatation is also similar to CT of 03/04/2013. Pancreas: Pancreas is normal. No ductal dilatation. No pancreatic inflammation. Spleen: Normal spleen Adrenals/urinary tract: Adrenal glands normal. The renal parenchyma is near completely replaced  by multiple simple fluid attenuation cysts. No IV contrast administered. The presumed ruptured cyst described on CT 02/09/2017 is contracted in interval measuring 4.1 x 3.0 cm decreased from 6.7 by 5.3 cm No ureterolithiasis or obstructive uropathy no bladder calculi Stomach/Bowel: Stomach, small-bowel cecum are normal. Appendix not identified. Ascending transverse colon normal. Struck diverticular the descending colon sigmoid colon without inflammation. Rectum normal Vascular/Lymphatic: Abdominal aorta is normal caliber with atherosclerotic calcification. There is no retroperitoneal or periportal lymphadenopathy. No pelvic lymphadenopathy. Reproductive: Post hysterectomy. Low-density 3 cm cyst associated with the LEFT ovary minimally enlarged from 2.4 cm on 03/04/2013. Other: No inguinal hernia or ventral hernia. Musculoskeletal: No aggressive osseous lesion. IMPRESSION: 1. Contraction of the complex/ruptured cyst in the midpole RIGHT kidney compare to 02/09/2017. 2. Multi cystic change of the kidneys. No ureterolithiasis or obstructive uropathy. 3. Stable benign-appearing cyst of the LEFT adnexa. 4. Post hysterectomy and cholecystectomy. 5. Chronic dilatation the common bile duct not changed significantly from 03/04/2013 presumably relates to prior cholecystectomy. Electronically Signed   By: Suzy Bouchard M.D.   On: 03/11/2017 13:12    Assessment/Plan 1. Complication of vascular access for dialysis,  sequela Recommend:  The patient is experiencing increasing problems with their dialysis access.  Patient should have a fistulagram with the intention for intervention.  The intention for intervention is to restore appropriate flow and prevent thrombosis and possible loss of the access.  As well as improve the quality of dialysis therapy.  The risks, benefits and alternative therapies were reviewed in detail with the patient.  All questions were answered.  The patient wishes for me to discuss this with Dr Holley Raring before we to proceed with angio/intervention.    I have spoken with Dr Holley Raring and the new unit should be open any day now so we will move forward with the fistulogram to avoid the need for a catheter.  2. Chronic renal insufficiency, stage V (HCC) Due to start dialysis  3. Secondary hypertension Continue antihypertensive medications as already ordered, these medications have been reviewed and there are no changes at this time.    [name], MD  04/06/2017 10:29 AM

## 2017-04-14 ENCOUNTER — Ambulatory Visit
Admission: RE | Admit: 2017-04-14 | Discharge: 2017-04-14 | Disposition: A | Discharge status: Home / Self Care | Payer: Medicare HMO | Source: Ambulatory Visit | Attending: Vascular Surgery | Admitting: Vascular Surgery

## 2017-04-14 ENCOUNTER — Encounter
Admission: RE | Disposition: A | Discharge status: Home / Self Care | Payer: Self-pay | Source: Ambulatory Visit | Attending: Vascular Surgery

## 2017-04-14 DIAGNOSIS — Z90710 Acquired absence of both cervix and uterus: Secondary | ICD-10-CM | POA: Diagnosis not present

## 2017-04-14 DIAGNOSIS — Z885 Allergy status to narcotic agent status: Secondary | ICD-10-CM | POA: Insufficient documentation

## 2017-04-14 DIAGNOSIS — I120 Hypertensive chronic kidney disease with stage 5 chronic kidney disease or end stage renal disease: Secondary | ICD-10-CM | POA: Diagnosis not present

## 2017-04-14 DIAGNOSIS — Z8249 Family history of ischemic heart disease and other diseases of the circulatory system: Secondary | ICD-10-CM | POA: Diagnosis not present

## 2017-04-14 DIAGNOSIS — T82868A Thrombosis of vascular prosthetic devices, implants and grafts, initial encounter: Secondary | ICD-10-CM | POA: Diagnosis not present

## 2017-04-14 DIAGNOSIS — Z823 Family history of stroke: Secondary | ICD-10-CM | POA: Diagnosis not present

## 2017-04-14 DIAGNOSIS — N185 Chronic kidney disease, stage 5: Secondary | ICD-10-CM | POA: Diagnosis not present

## 2017-04-14 DIAGNOSIS — Z9889 Other specified postprocedural states: Secondary | ICD-10-CM | POA: Diagnosis not present

## 2017-04-14 DIAGNOSIS — T82590A Other mechanical complication of surgically created arteriovenous fistula, initial encounter: Secondary | ICD-10-CM | POA: Insufficient documentation

## 2017-04-14 DIAGNOSIS — Z888 Allergy status to other drugs, medicaments and biological substances status: Secondary | ICD-10-CM | POA: Diagnosis not present

## 2017-04-14 DIAGNOSIS — Y832 Surgical operation with anastomosis, bypass or graft as the cause of abnormal reaction of the patient, or of later complication, without mention of misadventure at the time of the procedure: Secondary | ICD-10-CM | POA: Diagnosis not present

## 2017-04-14 DIAGNOSIS — Z992 Dependence on renal dialysis: Secondary | ICD-10-CM | POA: Diagnosis not present

## 2017-04-14 DIAGNOSIS — Z86010 Personal history of colonic polyps: Secondary | ICD-10-CM | POA: Insufficient documentation

## 2017-04-14 DIAGNOSIS — Z9049 Acquired absence of other specified parts of digestive tract: Secondary | ICD-10-CM | POA: Insufficient documentation

## 2017-04-14 DIAGNOSIS — Z803 Family history of malignant neoplasm of breast: Secondary | ICD-10-CM | POA: Insufficient documentation

## 2017-04-14 DIAGNOSIS — N186 End stage renal disease: Secondary | ICD-10-CM | POA: Diagnosis not present

## 2017-04-14 HISTORY — PX: A/V FISTULAGRAM: CATH118298

## 2017-04-14 LAB — POTASSIUM (ARMC VASCULAR LAB ONLY): Potassium (ARMC vascular lab): 4.3 (ref 3.5–5.1)

## 2017-04-14 SURGERY — A/V FISTULAGRAM
Anesthesia: Moderate Sedation | Laterality: Left

## 2017-04-14 MED ORDER — FAMOTIDINE 20 MG PO TABS: 40.0000 mg | ORAL_TABLET | ORAL | Status: DC | PRN

## 2017-04-14 MED ORDER — MIDAZOLAM HCL 2 MG/2ML IJ SOLN
INTRAMUSCULAR | Status: DC | PRN
  Administered 2017-04-14: 1 mg via INTRAVENOUS
  Administered 2017-04-14: 2 mg via INTRAVENOUS
  Administered 2017-04-14: 1 mg via INTRAVENOUS

## 2017-04-14 MED ORDER — METHYLPREDNISOLONE SODIUM SUCC 125 MG IJ SOLR: 125.0000 mg | INTRAMUSCULAR | Status: DC | PRN

## 2017-04-14 MED ORDER — FENTANYL CITRATE (PF) 100 MCG/2ML IJ SOLN
INTRAMUSCULAR | Status: AC
  Filled 2017-04-14: qty 2

## 2017-04-14 MED ORDER — FENTANYL CITRATE (PF) 100 MCG/2ML IJ SOLN
INTRAMUSCULAR | Status: DC | PRN
  Administered 2017-04-14: 50 ug via INTRAVENOUS
  Administered 2017-04-14 (×2): 25 ug via INTRAVENOUS

## 2017-04-14 MED ORDER — MIDAZOLAM HCL 5 MG/5ML IJ SOLN
INTRAMUSCULAR | Status: AC
  Filled 2017-04-14: qty 5

## 2017-04-14 MED ORDER — LIDOCAINE HCL (PF) 1 % IJ SOLN
INTRAMUSCULAR | Status: AC
  Filled 2017-04-14: qty 30

## 2017-04-14 MED ORDER — HEPARIN SODIUM (PORCINE) 1000 UNIT/ML IJ SOLN
INTRAMUSCULAR | Status: AC
  Filled 2017-04-14: qty 1

## 2017-04-14 MED ORDER — HEPARIN SODIUM (PORCINE) 1000 UNIT/ML IJ SOLN
INTRAMUSCULAR | Status: DC | PRN
  Administered 2017-04-14: 3000 [IU] via INTRAVENOUS

## 2017-04-14 MED ORDER — HEPARIN (PORCINE) IN NACL 2-0.9 UNIT/ML-% IJ SOLN
INTRAMUSCULAR | Status: AC
  Filled 2017-04-14: qty 1000

## 2017-04-14 MED ORDER — CEFAZOLIN SODIUM-DEXTROSE 1-4 GM/50ML-% IV SOLN
1.0000 g | Freq: Once | INTRAVENOUS | Status: DC
  Administered 2017-04-14: 1 g via INTRAVENOUS

## 2017-04-14 MED ORDER — IOPAMIDOL (ISOVUE-300) INJECTION 61%
INTRAVENOUS | Status: DC | PRN
Start: 2017-04-14 — End: 2017-04-14
  Administered 2017-04-14: 25 mL via INTRA_ARTERIAL

## 2017-04-14 MED ORDER — SODIUM CHLORIDE 0.9 % IV SOLN: INTRAVENOUS | Status: DC

## 2017-04-14 SURGICAL SUPPLY — 22 items
BALLN LUTONIX AV 8X40X75 (BALLOONS) ×2
BALLN LUTONIX AV 8X60X75 (BALLOONS) ×2
BALLN LUTONIX DCB 7X40X130 (BALLOONS) ×2
BALLOON LUTONIX AV 8X40X75 (BALLOONS) ×1 IMPLANT
BALLOON LUTONIX AV 8X60X75 (BALLOONS) ×1 IMPLANT
BALLOON LUTONIX DCB 7X40X130 (BALLOONS) ×1 IMPLANT
CATH BEACON 5 .035 40 KMP TP (CATHETERS) ×1 IMPLANT
CATH BEACON 5 .038 40 KMP TP (CATHETERS) ×1
DEVICE PRESTO INFLATION (MISCELLANEOUS) ×2 IMPLANT
DRAPE BRACHIAL (DRAPES) ×2 IMPLANT
GLIDECATH 4FR STR (CATHETERS) ×2 IMPLANT
NEEDLE ENTRY 21GA 7CM ECHOTIP (NEEDLE) ×2 IMPLANT
PACK ANGIOGRAPHY (CUSTOM PROCEDURE TRAY) ×2 IMPLANT
SET INTRO CAPELLA COAXIAL (SET/KITS/TRAYS/PACK) ×2 IMPLANT
SHEATH BRITE TIP 6FRX5.5 (SHEATH) ×2 IMPLANT
SHEATH BRITE TIP 7FRX5.5 (SHEATH) ×2 IMPLANT
STENT VIABAHN 8X50X120 (Permanent Stent) ×2 IMPLANT
STENT VIABAHN5X120X8X (Permanent Stent) ×1 IMPLANT
SUT MNCRL AB 4-0 PS2 18 (SUTURE) ×2 IMPLANT
TOWEL OR 17X26 4PK STRL BLUE (TOWEL DISPOSABLE) ×2 IMPLANT
WIRE G V18X300CM (WIRE) ×2 IMPLANT
WIRE MAGIC TORQUE 260C (WIRE) ×2 IMPLANT

## 2017-04-14 NOTE — H&P (Signed)
[facility] History & Physical Update  The patient was interviewed and re-examined.  The patient's previous History and Physical has been reviewed and is unchanged.  There is no change in the plan of care. We plan to proceed with the scheduled procedure.  [name], MD  04/14/2017, 1:40 PM

## 2017-04-14 NOTE — Op Note (Addendum)
OPERATIVE NOTE   PROCEDURE: 1. Contrast injection left brachiocephalic  AV access 2. Percutaneous transluminal angioplasty and stent placement left brachiocephalic at the level of the shoulder  PRE-OPERATIVE DIAGNOSIS: Complication of dialysis access                                                       End Stage Renal Disease  POST-OPERATIVE DIAGNOSIS: same as above   SURGEON: Katha Cabal, M.D.  ANESTHESIA: Conscious sedation was administered under my direct supervision by the interventional radiology RN. IV Versed plus fentanyl were utilized. Continuous ECG, pulse oximetry and blood pressure was monitored throughout the entire procedure.  Conscious sedation was for a total of 32 minutes.  ESTIMATED BLOOD LOSS: minimal  FINDING(S): 1. Narrowing as the cephalic vein crosses the clavicle  SPECIMEN(S):  None  CONTRAST: 25 cc  FLUOROSCOPY TIME: 3.7 minutes  INDICATIONS: Joyce [name] is a 71 y.o. female who  presents with malfunctioning left AV access.  The patient is scheduled for angiography with possible intervention of the AV access.  The patient is aware the risks include but are not limited to: bleeding, infection, thrombosis of the cannulated access, and possible anaphylactic reaction to the contrast.  The patient acknowledges if the access can not be salvaged a tunneled catheter will be needed and will be placed during this procedure.  The patient is aware of the risks of the procedure and elects to proceed with the angiogram and intervention.  DESCRIPTION: After full informed written consent was obtained, the patient was brought back to the Special Procedure suite and placed supine position.  Appropriate cardiopulmonary monitors were placed.  The left arm was prepped and draped in the standard fashion.  Appropriate timeout is called. The left brachiocephalic  was cannulated with a micropuncture needle.  Cannulation was performed with ultrasound guidance. Ultrasound was  placed in a sterile sleeve, the AV access was interrogated and noted to be echolucent and compressible indicating patency. Image was recorded for the permanent record. The puncture is performed under continuous ultrasound visualization.   The microwire was advanced and the needle was exchanged for  a microsheath.  The J-wire was then advanced and a 6 Fr sheath inserted.  Hand injections were completed to image the access from the arterial anastomosis through the entire access.  The central venous structures were also imaged by hand injections.  Using space age computerized algorithm quantifications the patient's stenosis is 78.5% when compared to the surrounding vein.  Based on the images,  3000 units of heparin was given and a wire was negotiated through the strictures within the venous portion of the graft.  Initially, a 7 mm x 40 mm Lutonix drug-eluting balloon was used to treat the lesion inflation was to 12 atm 1 minute. Subsequently, an 8 mm x 40 mm Lutonix balloon was utilized. Follow-up imaging demonstrated persistent greater than 30% narrowing with marked pulsatility remaining in the AV access by palpation. Therefore, an 8 mm x 50 mm Viabahn was deployed across the stenoses and postdilated with an 8 mm Lutonix balloon.  Follow-up imaging demonstrates complete resolution of the stricture with rapid flow of contrast through the graft, the central venous anatomy is preserved.  A 4-0 Monocryl purse-string suture was sewn around the sheath.  The sheath was removed and light pressure was applied.  A sterile bandage was applied to the puncture site.    COMPLICATIONS: None  CONDITION: [name], M.D [facility] Office: [phone]  04/14/2017 2:55 PM

## 2017-04-15 ENCOUNTER — Encounter: Payer: Self-pay | Admitting: Vascular Surgery

## 2017-04-17 ENCOUNTER — Telehealth (INDEPENDENT_AMBULATORY_CARE_PROVIDER_SITE_OTHER): Payer: Self-pay

## 2017-04-17 NOTE — Telephone Encounter (Signed)
Patient had a fistulagram on 04/14/17 and was wondering if it will stay as large as it is. I explained that it could be swollen now and may go down in a week or two. If it doesn't she is to call back.

## 2017-04-21 DIAGNOSIS — I10 Essential (primary) hypertension: Secondary | ICD-10-CM | POA: Diagnosis not present

## 2017-04-21 DIAGNOSIS — E669 Obesity, unspecified: Secondary | ICD-10-CM | POA: Diagnosis not present

## 2017-04-21 DIAGNOSIS — N184 Chronic kidney disease, stage 4 (severe): Secondary | ICD-10-CM | POA: Diagnosis not present

## 2017-04-21 DIAGNOSIS — R0602 Shortness of breath: Secondary | ICD-10-CM | POA: Diagnosis not present

## 2017-04-21 DIAGNOSIS — Z23 Encounter for immunization: Secondary | ICD-10-CM | POA: Diagnosis not present

## 2017-04-22 DIAGNOSIS — N184 Chronic kidney disease, stage 4 (severe): Secondary | ICD-10-CM | POA: Diagnosis not present

## 2017-04-22 DIAGNOSIS — N183 Chronic kidney disease, stage 3 (moderate): Secondary | ICD-10-CM | POA: Diagnosis not present

## 2017-04-22 DIAGNOSIS — K5732 Diverticulitis of large intestine without perforation or abscess without bleeding: Secondary | ICD-10-CM | POA: Diagnosis not present

## 2017-04-22 DIAGNOSIS — Z8659 Personal history of other mental and behavioral disorders: Secondary | ICD-10-CM | POA: Diagnosis not present

## 2017-04-22 DIAGNOSIS — R21 Rash and other nonspecific skin eruption: Secondary | ICD-10-CM | POA: Diagnosis not present

## 2017-04-23 DIAGNOSIS — N2581 Secondary hyperparathyroidism of renal origin: Secondary | ICD-10-CM | POA: Diagnosis not present

## 2017-04-23 DIAGNOSIS — D631 Anemia in chronic kidney disease: Secondary | ICD-10-CM | POA: Diagnosis not present

## 2017-04-23 DIAGNOSIS — I10 Essential (primary) hypertension: Secondary | ICD-10-CM | POA: Diagnosis not present

## 2017-04-23 DIAGNOSIS — N186 End stage renal disease: Secondary | ICD-10-CM | POA: Diagnosis not present

## 2017-04-24 ENCOUNTER — Encounter (INDEPENDENT_AMBULATORY_CARE_PROVIDER_SITE_OTHER): Payer: Self-pay

## 2017-04-24 ENCOUNTER — Telehealth (INDEPENDENT_AMBULATORY_CARE_PROVIDER_SITE_OTHER): Payer: Self-pay | Admitting: Vascular Surgery

## 2017-04-24 DIAGNOSIS — R42 Dizziness and giddiness: Secondary | ICD-10-CM | POA: Diagnosis not present

## 2017-04-24 DIAGNOSIS — M545 Low back pain: Secondary | ICD-10-CM | POA: Diagnosis not present

## 2017-04-24 DIAGNOSIS — N184 Chronic kidney disease, stage 4 (severe): Secondary | ICD-10-CM | POA: Diagnosis not present

## 2017-04-24 DIAGNOSIS — D690 Allergic purpura: Secondary | ICD-10-CM | POA: Diagnosis not present

## 2017-04-24 NOTE — Telephone Encounter (Signed)
Letter has been sent to employer

## 2017-04-24 NOTE — Telephone Encounter (Signed)
NEEDS LETTER FAXED VERIFYING HER PROCEDURE WAS PERFORMED ON 04/14/17 SENT TO HER JOB . [phone] ATT: JILL SULLIVAN

## 2017-04-29 ENCOUNTER — Telehealth (INDEPENDENT_AMBULATORY_CARE_PROVIDER_SITE_OTHER): Payer: Self-pay | Admitting: Vascular Surgery

## 2017-04-29 ENCOUNTER — Encounter (INDEPENDENT_AMBULATORY_CARE_PROVIDER_SITE_OTHER): Payer: Self-pay

## 2017-04-29 NOTE — Telephone Encounter (Signed)
I got the letter written and will be faxing it out to the number supplied.

## 2017-04-29 NOTE — Telephone Encounter (Signed)
Joyce [name] CALLED SHE NEEDS A NOTE STATING SHE HAD A PROCEDURE DONE ON 04/14/2017. HERE IS HER FAX# [phone]. YOU CAN REACH HER AT [phone]

## 2017-05-04 DIAGNOSIS — Z992 Dependence on renal dialysis: Secondary | ICD-10-CM | POA: Diagnosis not present

## 2017-05-04 DIAGNOSIS — Z1159 Encounter for screening for other viral diseases: Secondary | ICD-10-CM | POA: Diagnosis not present

## 2017-05-04 DIAGNOSIS — D509 Iron deficiency anemia, unspecified: Secondary | ICD-10-CM | POA: Diagnosis not present

## 2017-05-04 DIAGNOSIS — N186 End stage renal disease: Secondary | ICD-10-CM | POA: Diagnosis not present

## 2017-05-04 DIAGNOSIS — D631 Anemia in chronic kidney disease: Secondary | ICD-10-CM | POA: Diagnosis not present

## 2017-05-06 DIAGNOSIS — D631 Anemia in chronic kidney disease: Secondary | ICD-10-CM | POA: Diagnosis not present

## 2017-05-06 DIAGNOSIS — N186 End stage renal disease: Secondary | ICD-10-CM | POA: Diagnosis not present

## 2017-05-06 DIAGNOSIS — D509 Iron deficiency anemia, unspecified: Secondary | ICD-10-CM | POA: Diagnosis not present

## 2017-05-06 DIAGNOSIS — Z992 Dependence on renal dialysis: Secondary | ICD-10-CM | POA: Diagnosis not present

## 2017-05-11 DIAGNOSIS — N186 End stage renal disease: Secondary | ICD-10-CM | POA: Diagnosis not present

## 2017-05-11 DIAGNOSIS — D509 Iron deficiency anemia, unspecified: Secondary | ICD-10-CM | POA: Diagnosis not present

## 2017-05-11 DIAGNOSIS — Z992 Dependence on renal dialysis: Secondary | ICD-10-CM | POA: Diagnosis not present

## 2017-05-11 DIAGNOSIS — D631 Anemia in chronic kidney disease: Secondary | ICD-10-CM | POA: Diagnosis not present

## 2017-05-13 ENCOUNTER — Ambulatory Visit (INDEPENDENT_AMBULATORY_CARE_PROVIDER_SITE_OTHER): Payer: Medicare HMO | Admitting: Vascular Surgery

## 2017-05-13 ENCOUNTER — Encounter (INDEPENDENT_AMBULATORY_CARE_PROVIDER_SITE_OTHER): Payer: Medicare HMO

## 2017-05-13 DIAGNOSIS — D631 Anemia in chronic kidney disease: Secondary | ICD-10-CM | POA: Diagnosis not present

## 2017-05-13 DIAGNOSIS — D509 Iron deficiency anemia, unspecified: Secondary | ICD-10-CM | POA: Diagnosis not present

## 2017-05-13 DIAGNOSIS — Z992 Dependence on renal dialysis: Secondary | ICD-10-CM | POA: Diagnosis not present

## 2017-05-13 DIAGNOSIS — N186 End stage renal disease: Secondary | ICD-10-CM | POA: Diagnosis not present

## 2017-05-15 DIAGNOSIS — N186 End stage renal disease: Secondary | ICD-10-CM | POA: Diagnosis not present

## 2017-05-15 DIAGNOSIS — Z992 Dependence on renal dialysis: Secondary | ICD-10-CM | POA: Diagnosis not present

## 2017-05-15 DIAGNOSIS — D631 Anemia in chronic kidney disease: Secondary | ICD-10-CM | POA: Diagnosis not present

## 2017-05-15 DIAGNOSIS — D509 Iron deficiency anemia, unspecified: Secondary | ICD-10-CM | POA: Diagnosis not present

## 2017-05-18 DIAGNOSIS — D509 Iron deficiency anemia, unspecified: Secondary | ICD-10-CM | POA: Diagnosis not present

## 2017-05-18 DIAGNOSIS — D631 Anemia in chronic kidney disease: Secondary | ICD-10-CM | POA: Diagnosis not present

## 2017-05-18 DIAGNOSIS — Z992 Dependence on renal dialysis: Secondary | ICD-10-CM | POA: Diagnosis not present

## 2017-05-18 DIAGNOSIS — N186 End stage renal disease: Secondary | ICD-10-CM | POA: Diagnosis not present

## 2017-05-19 DIAGNOSIS — M545 Low back pain: Secondary | ICD-10-CM | POA: Diagnosis not present

## 2017-05-19 DIAGNOSIS — H8149 Vertigo of central origin, unspecified ear: Secondary | ICD-10-CM | POA: Diagnosis not present

## 2017-05-19 DIAGNOSIS — N184 Chronic kidney disease, stage 4 (severe): Secondary | ICD-10-CM | POA: Diagnosis not present

## 2017-05-19 DIAGNOSIS — R42 Dizziness and giddiness: Secondary | ICD-10-CM | POA: Diagnosis not present

## 2017-05-20 DIAGNOSIS — D631 Anemia in chronic kidney disease: Secondary | ICD-10-CM | POA: Diagnosis not present

## 2017-05-20 DIAGNOSIS — N186 End stage renal disease: Secondary | ICD-10-CM | POA: Diagnosis not present

## 2017-05-20 DIAGNOSIS — D509 Iron deficiency anemia, unspecified: Secondary | ICD-10-CM | POA: Diagnosis not present

## 2017-05-20 DIAGNOSIS — Z992 Dependence on renal dialysis: Secondary | ICD-10-CM | POA: Diagnosis not present

## 2017-05-22 DIAGNOSIS — D509 Iron deficiency anemia, unspecified: Secondary | ICD-10-CM | POA: Diagnosis not present

## 2017-05-22 DIAGNOSIS — Z992 Dependence on renal dialysis: Secondary | ICD-10-CM | POA: Diagnosis not present

## 2017-05-22 DIAGNOSIS — N186 End stage renal disease: Secondary | ICD-10-CM | POA: Diagnosis not present

## 2017-05-22 DIAGNOSIS — D631 Anemia in chronic kidney disease: Secondary | ICD-10-CM | POA: Diagnosis not present

## 2017-05-22 DIAGNOSIS — Z23 Encounter for immunization: Secondary | ICD-10-CM | POA: Diagnosis not present

## 2017-05-25 DIAGNOSIS — N186 End stage renal disease: Secondary | ICD-10-CM | POA: Diagnosis not present

## 2017-05-25 DIAGNOSIS — D509 Iron deficiency anemia, unspecified: Secondary | ICD-10-CM | POA: Diagnosis not present

## 2017-05-25 DIAGNOSIS — Z23 Encounter for immunization: Secondary | ICD-10-CM | POA: Diagnosis not present

## 2017-05-25 DIAGNOSIS — Z992 Dependence on renal dialysis: Secondary | ICD-10-CM | POA: Diagnosis not present

## 2017-05-25 DIAGNOSIS — D631 Anemia in chronic kidney disease: Secondary | ICD-10-CM | POA: Diagnosis not present

## 2017-05-27 DIAGNOSIS — Z23 Encounter for immunization: Secondary | ICD-10-CM | POA: Diagnosis not present

## 2017-05-27 DIAGNOSIS — N186 End stage renal disease: Secondary | ICD-10-CM | POA: Diagnosis not present

## 2017-05-27 DIAGNOSIS — D631 Anemia in chronic kidney disease: Secondary | ICD-10-CM | POA: Diagnosis not present

## 2017-05-27 DIAGNOSIS — D509 Iron deficiency anemia, unspecified: Secondary | ICD-10-CM | POA: Diagnosis not present

## 2017-05-27 DIAGNOSIS — Z992 Dependence on renal dialysis: Secondary | ICD-10-CM | POA: Diagnosis not present

## 2017-05-29 DIAGNOSIS — N186 End stage renal disease: Secondary | ICD-10-CM | POA: Diagnosis not present

## 2017-05-29 DIAGNOSIS — Z23 Encounter for immunization: Secondary | ICD-10-CM | POA: Diagnosis not present

## 2017-05-29 DIAGNOSIS — D631 Anemia in chronic kidney disease: Secondary | ICD-10-CM | POA: Diagnosis not present

## 2017-05-29 DIAGNOSIS — D509 Iron deficiency anemia, unspecified: Secondary | ICD-10-CM | POA: Diagnosis not present

## 2017-05-29 DIAGNOSIS — Z992 Dependence on renal dialysis: Secondary | ICD-10-CM | POA: Diagnosis not present

## 2017-06-01 ENCOUNTER — Other Ambulatory Visit (INDEPENDENT_AMBULATORY_CARE_PROVIDER_SITE_OTHER): Payer: Self-pay | Admitting: Vascular Surgery

## 2017-06-01 DIAGNOSIS — D509 Iron deficiency anemia, unspecified: Secondary | ICD-10-CM | POA: Diagnosis not present

## 2017-06-01 DIAGNOSIS — Z23 Encounter for immunization: Secondary | ICD-10-CM | POA: Diagnosis not present

## 2017-06-01 DIAGNOSIS — Z992 Dependence on renal dialysis: Secondary | ICD-10-CM | POA: Diagnosis not present

## 2017-06-01 DIAGNOSIS — N186 End stage renal disease: Secondary | ICD-10-CM | POA: Diagnosis not present

## 2017-06-01 DIAGNOSIS — N185 Chronic kidney disease, stage 5: Secondary | ICD-10-CM

## 2017-06-01 DIAGNOSIS — D631 Anemia in chronic kidney disease: Secondary | ICD-10-CM | POA: Diagnosis not present

## 2017-06-02 ENCOUNTER — Encounter (INDEPENDENT_AMBULATORY_CARE_PROVIDER_SITE_OTHER): Payer: Self-pay | Admitting: Vascular Surgery

## 2017-06-02 ENCOUNTER — Ambulatory Visit (INDEPENDENT_AMBULATORY_CARE_PROVIDER_SITE_OTHER): Payer: Medicare HMO | Admitting: Vascular Surgery

## 2017-06-02 ENCOUNTER — Ambulatory Visit (INDEPENDENT_AMBULATORY_CARE_PROVIDER_SITE_OTHER): Payer: Medicare HMO

## 2017-06-02 VITALS — BP 135/66 | HR 73 | Resp 16 | Ht 64.0 in | Wt 175.0 lb

## 2017-06-02 DIAGNOSIS — T829XXS Unspecified complication of cardiac and vascular prosthetic device, implant and graft, sequela: Secondary | ICD-10-CM

## 2017-06-02 DIAGNOSIS — N186 End stage renal disease: Secondary | ICD-10-CM

## 2017-06-02 DIAGNOSIS — Z992 Dependence on renal dialysis: Secondary | ICD-10-CM

## 2017-06-02 DIAGNOSIS — N185 Chronic kidney disease, stage 5: Secondary | ICD-10-CM | POA: Diagnosis not present

## 2017-06-02 NOTE — Progress Notes (Signed)
Subjective:    Patient ID: Joyce [name], female    DOB: Mar 28, 1946, 71 y.o.   MRN: [id number] Chief Complaint  Patient presents with  . Follow-up    2 wk f/u HDA   Patient presents for first post procedure follow-up.  He status post a left upper extremity fistulogram on April 14, 2017.  The patient continues to experience bruising after her dialysis treatments.  The patient is also experiencing prolonged bleeding for her dialysis treatments as well.  The patient underwent a left upper extremity HDA which was notable for narrowing with elevated velocities of the proximal stent and at the shoulder level of the cephalic vein.  Narrowing of the proximal left cephalic vein to the subclavian confluence with elevated velocities.  Narrowing with elevated velocities of the perianastomotic area with dilatation of the cephalic vein distally.  Patent left brachial vein proximal and distal to anastomosis.  Patient denies any left upper extremity pain or ulceration.  Denies any fever, nausea vomiting.   Review of Systems  Constitutional: Negative.   HENT: Negative.   Eyes: Negative.   Respiratory: Negative.   Cardiovascular: Negative.   Gastrointestinal: Negative.   Endocrine: Negative.   Genitourinary: Negative.        Bruising around fistula site  Musculoskeletal: Negative.   Skin: Negative.   Allergic/Immunologic: Negative.   Neurological: Negative.   Hematological: Negative.   Psychiatric/Behavioral: Negative.       Objective:   Physical Exam  Constitutional: She is oriented to person, place, and time. She appears well-developed and well-nourished. No distress.  HENT:  Head: Normocephalic and atraumatic.  Eyes: Conjunctivae are normal. Pupils are equal, round, and reactive to light.  Neck: Normal range of motion.  Cardiovascular: Normal rate, regular rhythm, normal heart sounds and intact distal pulses.  Pulses:      Radial pulses are 2+ on the right side, and 2+ on the left  side.  Left upper extremity dialysis site: Good bruit pulsatile thrill  Pulmonary/Chest: Effort normal and breath sounds normal.  Musculoskeletal: Normal range of motion. She exhibits no edema.  Neurological: She is alert and oriented to person, place, and time.  Skin: Skin is warm and dry. She is not diaphoretic.  Psychiatric: She has a normal mood and affect. Her behavior is normal. Judgment and thought content normal.  Vitals reviewed.  BP 135/66 (BP Location: Right Arm)   Pulse 73   Resp 16   Ht 5\' 4"  (1.626 m)   Wt 175 lb (79.4 kg)   BMI 30.04 kg/m   Past Medical History:  Diagnosis Date  . Anal fissure   . Bipolar affective disorder ([place])   . CKD (chronic kidney disease)    Dr Holley Raring [name] 4  . Colon polyps   . Diverticulitis   . Diverticulitis   . Family history of adverse reaction to anesthesia    mom - PONV  . GERD (gastroesophageal reflux disease)   . Headache    migraines - none over 10 yrs  . Heart murmur   . History of hiatal hernia   . Hypertension   . Pancreatitis    Valproic acid  . Vertigo    Social History   Socioeconomic History  . Marital status: Single    Spouse name: Not on file  . Number of children: 1  . Years of education: Not on file  . Highest education level: Not on file  Social Needs  . Financial resource strain: Not on file  .  Food insecurity - worry: Not on file  . Food insecurity - inability: Not on file  . Transportation needs - medical: Not on file  . Transportation needs - non-medical: Not on file  Occupational History  . Occupation: Surveyor, quantity: [employer]  Tobacco Use  . Smoking status: Never Smoker  . Smokeless tobacco: Never Used  Substance and Sexual Activity  . Alcohol use: No    Alcohol/week: 0.0 oz  . Drug use: No  . Sexual activity: Not Currently  Other Topics Concern  . 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    Past Surgical History:  Procedure Laterality Date  . ABDOMINAL HYSTERECTOMY  1990 ?  . AV FISTULA PLACEMENT  4/30  . BREAST EXCISIONAL BIOPSY Left 1994   neg surgical bx  . CHOLECYSTECTOMY  2003  . COLONOSCOPY  2014   Dr. Jamal Collin  . EYE SURGERY     Family History  Problem Relation Age of Onset  . Stroke Father   . Hypertension Father   . Breast cancer Other   . Colon cancer Neg Hx   . Liver disease Neg Hx    Allergies  Allergen Reactions  . Morphine And Related Shortness Of Breath    Pt reports chest pain and difficulty breathing.  . Indomethacin Hives  . Pollen Extract Other (See Comments)    Sinus problems and HA      Assessment & Plan:  Patient presents for first post procedure follow-up.  He status post a left upper extremity fistulogram on April 14, 2017.  The patient continues to experience bruising after her dialysis treatments.  The patient is also experiencing prolonged bleeding for her dialysis treatments as well.  The patient underwent a left upper extremity HDA which was notable for narrowing with elevated velocities of the proximal stent and at the shoulder level of the cephalic vein.  Narrowing of the proximal left cephalic vein to the subclavian confluence with elevated velocities.  Narrowing with elevated velocities of the perianastomotic area with dilatation of the cephalic vein distally.  Patent left brachial vein proximal and distal to anastomosis.  Patient denies any left upper extremity pain or ulceration.  Denies any fever, nausea vomiting.  1. Complication of vascular access for dialysis, sequela - Stable Patient presents for her first post procedure follow-up with continued issues during dialysis. Patient with areas of narrowing on duplex today Patient with continued bruising around her fistula site. Recommend a left upper extremity fistulogram with possible intervention in an attempt to assess anatomy and possibly restore function to the patient's  fistula Procedure, risks and benefits explained to patient All questions answered Patient wishes to proceed  2. ESRD on dialysis ([place]) - Stable As above  Current Outpatient Medications on File Prior to Visit  Medication Sig Dispense Refill  . acetaminophen (TYLENOL) 325 MG tablet Take 325 mg by mouth every 8 (eight) hours as needed.    . calcitRIOL (ROCALTROL) 0.25 MCG capsule Take 0.25 mcg by mouth daily.    . carbamazepine (TEGRETOL) 200 MG tablet Take 200 mg by mouth 2 (two) times daily.    . cholecalciferol (VITAMIN D) 1000 UNITS tablet Take 1,000 Units by mouth daily.    . cyanocobalamin (,VITAMIN B-12,) 1000 MCG/ML injection Inject 1,000 mcg into the muscle every 30 (thirty) days.    . dorzolamide-timolol (COSOPT) 22.3-6.8 MG/ML ophthalmic solution Place 1 drop  into both eyes 2 (two) times daily. 0700 & 1400    . latanoprost (XALATAN) 0.005 % ophthalmic solution Place 1 drop into both eyes at bedtime.     . pantoprazole (PROTONIX) 40 MG tablet TAKE 1 TABLET EVERY DAY (Patient taking differently: TAKE 1 TABLET EVERY DAY IN THE MORNING.) 90 tablet 3  . pramipexole (MIRAPEX) 0.125 MG tablet Take 0.125 mg by mouth daily at 2 PM.  3  . QUEtiapine (SEROQUEL) 25 MG tablet Take 50 mg by mouth at bedtime.     . timolol (TIMOPTIC) 0.5 % ophthalmic solution Place 1 drop into both eyes 2 (two) times daily. 7am and 2pm    . verapamil (CALAN-SR) 240 MG CR tablet Take 300 mg every evening by mouth.     . bisacodyl (DULCOLAX) 5 MG EC tablet Take 1 tablet (5 mg total) by mouth daily as needed for moderate constipation. (Patient not taking: Reported on 04/09/2017) 30 tablet 0  . oxyCODONE-acetaminophen (PERCOCET/ROXICET) 5-325 MG tablet Take 1 tablet by mouth every 6 (six) hours as needed for moderate pain or severe pain. (Patient not taking: Reported on 03/11/2017) 25 tablet 0  . traMADol (ULTRAM) 50 MG tablet Take 1 tablet (50 mg total) by mouth every 6 (six) hours as needed for moderate pain or severe  pain. (Patient not taking: Reported on 03/11/2017) 12 tablet 0   No current facility-administered medications on file prior to visit.     There are no Patient Instructions on file for this visit. No Follow-up on file.   Oday Ridings A Kendal Raffo, PA-C

## 2017-06-03 DIAGNOSIS — Z23 Encounter for immunization: Secondary | ICD-10-CM | POA: Diagnosis not present

## 2017-06-03 DIAGNOSIS — D631 Anemia in chronic kidney disease: Secondary | ICD-10-CM | POA: Diagnosis not present

## 2017-06-03 DIAGNOSIS — D509 Iron deficiency anemia, unspecified: Secondary | ICD-10-CM | POA: Diagnosis not present

## 2017-06-03 DIAGNOSIS — Z992 Dependence on renal dialysis: Secondary | ICD-10-CM | POA: Diagnosis not present

## 2017-06-03 DIAGNOSIS — N186 End stage renal disease: Secondary | ICD-10-CM | POA: Diagnosis not present

## 2017-06-05 ENCOUNTER — Other Ambulatory Visit (INDEPENDENT_AMBULATORY_CARE_PROVIDER_SITE_OTHER): Payer: Self-pay

## 2017-06-05 ENCOUNTER — Encounter (INDEPENDENT_AMBULATORY_CARE_PROVIDER_SITE_OTHER): Payer: Self-pay

## 2017-06-05 DIAGNOSIS — D509 Iron deficiency anemia, unspecified: Secondary | ICD-10-CM | POA: Diagnosis not present

## 2017-06-05 DIAGNOSIS — Z23 Encounter for immunization: Secondary | ICD-10-CM | POA: Diagnosis not present

## 2017-06-05 DIAGNOSIS — N186 End stage renal disease: Secondary | ICD-10-CM | POA: Diagnosis not present

## 2017-06-05 DIAGNOSIS — D631 Anemia in chronic kidney disease: Secondary | ICD-10-CM | POA: Diagnosis not present

## 2017-06-05 DIAGNOSIS — Z992 Dependence on renal dialysis: Secondary | ICD-10-CM | POA: Diagnosis not present

## 2017-06-05 NOTE — Progress Notes (Signed)
100

## 2017-06-08 DIAGNOSIS — N186 End stage renal disease: Secondary | ICD-10-CM | POA: Diagnosis not present

## 2017-06-08 DIAGNOSIS — D631 Anemia in chronic kidney disease: Secondary | ICD-10-CM | POA: Diagnosis not present

## 2017-06-08 DIAGNOSIS — Z23 Encounter for immunization: Secondary | ICD-10-CM | POA: Diagnosis not present

## 2017-06-08 DIAGNOSIS — Z992 Dependence on renal dialysis: Secondary | ICD-10-CM | POA: Diagnosis not present

## 2017-06-08 DIAGNOSIS — D509 Iron deficiency anemia, unspecified: Secondary | ICD-10-CM | POA: Diagnosis not present

## 2017-06-08 MED ORDER — CEFAZOLIN SODIUM-DEXTROSE 1-4 GM/50ML-% IV SOLN
1.0000 g | Freq: Once | INTRAVENOUS | Status: AC
  Administered 2017-06-09: 1 g via INTRAVENOUS

## 2017-06-09 ENCOUNTER — Ambulatory Visit
Admission: RE | Admit: 2017-06-09 | Discharge: 2017-06-09 | Disposition: A | Discharge status: Home / Self Care | Payer: Medicare HMO | Source: Ambulatory Visit | Attending: Vascular Surgery | Admitting: Vascular Surgery

## 2017-06-09 ENCOUNTER — Encounter
Admission: RE | Disposition: A | Discharge status: Home / Self Care | Payer: Self-pay | Source: Ambulatory Visit | Attending: Vascular Surgery

## 2017-06-09 DIAGNOSIS — T82868A Thrombosis of vascular prosthetic devices, implants and grafts, initial encounter: Secondary | ICD-10-CM | POA: Diagnosis not present

## 2017-06-09 DIAGNOSIS — T82858A Stenosis of vascular prosthetic devices, implants and grafts, initial encounter: Secondary | ICD-10-CM | POA: Insufficient documentation

## 2017-06-09 DIAGNOSIS — N186 End stage renal disease: Secondary | ICD-10-CM | POA: Insufficient documentation

## 2017-06-09 DIAGNOSIS — Z992 Dependence on renal dialysis: Secondary | ICD-10-CM | POA: Diagnosis not present

## 2017-06-09 DIAGNOSIS — Y832 Surgical operation with anastomosis, bypass or graft as the cause of abnormal reaction of the patient, or of later complication, without mention of misadventure at the time of the procedure: Secondary | ICD-10-CM | POA: Insufficient documentation

## 2017-06-09 HISTORY — PX: A/V FISTULAGRAM: CATH118298

## 2017-06-09 LAB — POTASSIUM (ARMC VASCULAR LAB ONLY): Potassium (ARMC vascular lab): 3.1 — ABNORMAL LOW (ref 3.5–5.1)

## 2017-06-09 SURGERY — A/V FISTULAGRAM
Anesthesia: Moderate Sedation | Laterality: Left

## 2017-06-09 MED ORDER — CEFAZOLIN SODIUM-DEXTROSE 1-4 GM/50ML-% IV SOLN
INTRAVENOUS | Status: AC
  Filled 2017-06-09: qty 50

## 2017-06-09 MED ORDER — HEPARIN (PORCINE) IN NACL 2-0.9 UNIT/ML-% IJ SOLN
INTRAMUSCULAR | Status: AC
Start: 2017-06-09 — End: 2017-06-09
  Filled 2017-06-09: qty 1000

## 2017-06-09 MED ORDER — MIDAZOLAM HCL 5 MG/5ML IJ SOLN
INTRAMUSCULAR | Status: AC
  Filled 2017-06-09: qty 5

## 2017-06-09 MED ORDER — FENTANYL CITRATE (PF) 100 MCG/2ML IJ SOLN
INTRAMUSCULAR | Status: AC
  Filled 2017-06-09: qty 2

## 2017-06-09 MED ORDER — LIDOCAINE HCL (PF) 1 % IJ SOLN
INTRAMUSCULAR | Status: AC
  Filled 2017-06-09: qty 30

## 2017-06-09 MED ORDER — HEPARIN SODIUM (PORCINE) 1000 UNIT/ML IJ SOLN
INTRAMUSCULAR | Status: DC | PRN
  Administered 2017-06-09: 3000 [IU] via INTRAVENOUS

## 2017-06-09 MED ORDER — SODIUM CHLORIDE 0.9 % IV SOLN
INTRAVENOUS | Status: DC
  Administered 2017-06-09: 13:00:00 via INTRAVENOUS

## 2017-06-09 MED ORDER — FENTANYL CITRATE (PF) 100 MCG/2ML IJ SOLN
INTRAMUSCULAR | Status: DC | PRN
  Administered 2017-06-09: 50 ug via INTRAVENOUS
  Administered 2017-06-09: 25 ug via INTRAVENOUS

## 2017-06-09 MED ORDER — MIDAZOLAM HCL 2 MG/2ML IJ SOLN
INTRAMUSCULAR | Status: DC | PRN
  Administered 2017-06-09: 2 mg via INTRAVENOUS

## 2017-06-09 MED ORDER — IOPAMIDOL (ISOVUE-300) INJECTION 61%
INTRAVENOUS | Status: DC | PRN
  Administered 2017-06-09: 20 mL via INTRAVENOUS

## 2017-06-09 MED ORDER — HEPARIN SODIUM (PORCINE) 1000 UNIT/ML IJ SOLN
INTRAMUSCULAR | Status: AC
  Filled 2017-06-09: qty 1

## 2017-06-09 SURGICAL SUPPLY — 17 items
BALLN LUTONIX 7X100X130 (BALLOONS) ×2
BALLN LUTONIX AV 8X60X75 (BALLOONS) ×2
BALLOON LUTONIX 7X100X130 (BALLOONS) ×1 IMPLANT
BALLOON LUTONIX AV 8X60X75 (BALLOONS) ×1 IMPLANT
CATH BEACON 5 .035 40 KMP TP (CATHETERS) ×1 IMPLANT
CATH BEACON 5 .038 40 KMP TP (CATHETERS) ×1
DEVICE PRESTO INFLATION (MISCELLANEOUS) ×2 IMPLANT
DEVICE TORQUE (MISCELLANEOUS) ×2 IMPLANT
DRAPE BRACHIAL (DRAPES) ×2 IMPLANT
GUIDEWIRE ANGLED .035 180CM (WIRE) ×2 IMPLANT
NEEDLE ENTRY 21GA 7CM ECHOTIP (NEEDLE) ×2 IMPLANT
PACK ANGIOGRAPHY (CUSTOM PROCEDURE TRAY) ×2 IMPLANT
SET INTRO CAPELLA COAXIAL (SET/KITS/TRAYS/PACK) ×2 IMPLANT
SHEATH BRITE TIP 6FRX5.5 (SHEATH) ×2 IMPLANT
SHIELD RADPAD DADD DRAPE 4X9 (MISCELLANEOUS) ×2 IMPLANT
SUT MNCRL AB 4-0 PS2 18 (SUTURE) ×2 IMPLANT
WIRE MAGIC TORQUE 260C (WIRE) ×2 IMPLANT

## 2017-06-09 NOTE — Discharge Instructions (Signed)

## 2017-06-09 NOTE — H&P (Signed)
 [facility] History & Physical Update  The patient was interviewed and re-examined.  The patient's previous History and Physical has been reviewed and is unchanged.  There is no change in the plan of care. We plan to proceed with the scheduled procedure.  [name], MD  06/09/2017, 1:11 PM

## 2017-06-10 ENCOUNTER — Encounter: Payer: Self-pay | Admitting: Vascular Surgery

## 2017-06-10 DIAGNOSIS — Z23 Encounter for immunization: Secondary | ICD-10-CM | POA: Diagnosis not present

## 2017-06-10 DIAGNOSIS — N186 End stage renal disease: Secondary | ICD-10-CM | POA: Diagnosis not present

## 2017-06-10 DIAGNOSIS — Z992 Dependence on renal dialysis: Secondary | ICD-10-CM | POA: Diagnosis not present

## 2017-06-10 DIAGNOSIS — D631 Anemia in chronic kidney disease: Secondary | ICD-10-CM | POA: Diagnosis not present

## 2017-06-10 DIAGNOSIS — D509 Iron deficiency anemia, unspecified: Secondary | ICD-10-CM | POA: Diagnosis not present

## 2017-06-10 NOTE — Op Note (Signed)
OPERATIVE NOTE   PROCEDURE: 1. Contrast injection left brachiocephalic AV access 2. Percutaneous transluminal angioplasty cephalic vein at the confluence with the subclavian vein  PRE-OPERATIVE DIAGNOSIS: Complication of dialysis access                                                       End Stage Renal Disease  POST-OPERATIVE DIAGNOSIS: same as above   SURGEON: Katha Cabal, M.D.  ANESTHESIA: Conscious sedation was administered under my direct supervision by the interventional radiology RN. IV Versed plus fentanyl were utilized. Continuous ECG, pulse oximetry and blood pressure was monitored throughout the entire procedure.  Conscious sedation was for a total of 36 minutes.  ESTIMATED BLOOD LOSS: minimal  FINDING(S): Stricture of the AV graft  SPECIMEN(S):  None  CONTRAST: 20 cc  FLUOROSCOPY TIME: 1.6 minutes  INDICATIONS: Mateya Torti is a 71 y.o. female who  presents with malfunctioning left brachiocephalic AV access.  The patient is scheduled for angiography with possible intervention of the AV access.  The patient is aware the risks include but are not limited to: bleeding, infection, thrombosis of the cannulated access, and possible anaphylactic reaction to the contrast.  The patient acknowledges if the access can not be salvaged a tunneled catheter will be needed and will be placed during this procedure.  The patient is aware of the risks of the procedure and elects to proceed with the angiogram and intervention.  DESCRIPTION: After full informed written consent was obtained, the patient was brought back to the Special Procedure suite and placed supine position.  Appropriate cardiopulmonary monitors were placed.  The left arm was prepped and draped in the standard fashion.  Appropriate timeout is called. The left brachiocephalic fistula was cannulated with a micropuncture needle.  Cannulation was performed with ultrasound guidance. Ultrasound was placed in a sterile  sleeve, the AV access was interrogated and noted to be echolucent and compressible indicating patency. Image was recorded for the permanent record. The puncture is performed under continuous ultrasound visualization.   The microwire was advanced and the needle was exchanged for  a microsheath.  The J-wire was then advanced and a 6 Fr sheath inserted.  Hand injections were completed to image the access from the arterial anastomosis through the entire access.  The central venous structures were also imaged by hand injections.  Based on the images,  3000 units of heparin was given and a wire was negotiated through the strictures within the venous portion of the graft.  An 7 x 6 Lutonix drug-eluting balloon was used.  Inflation was to 14 atm for 1 minute.  Follow-up imaging demonstrated the area was still somewha under treated and therefore an 8 x 6 Lutonix drug-eluting balloon was advanced across the lesion and inflated to 14 atm for 1 minute.  Follow-up imaging demonstrated less than 5% residual stenosis.    As noted above follow-up imaging demonstrates complete resolution of the stricture with rapid flow of contrast through the graft, the central venous anatomy is preserved.  A 4-0 Monocryl purse-string suture was sewn around the sheath.  The sheath was removed and light pressure was applied.  A sterile bandage was applied to the puncture site.    COMPLICATIONS: None  CONDITION: [name], M.D [facility] Office: [phone]  06/10/2017 9:43  AM   

## 2017-06-12 DIAGNOSIS — Z992 Dependence on renal dialysis: Secondary | ICD-10-CM | POA: Diagnosis not present

## 2017-06-12 DIAGNOSIS — D509 Iron deficiency anemia, unspecified: Secondary | ICD-10-CM | POA: Diagnosis not present

## 2017-06-12 DIAGNOSIS — N186 End stage renal disease: Secondary | ICD-10-CM | POA: Diagnosis not present

## 2017-06-12 DIAGNOSIS — D631 Anemia in chronic kidney disease: Secondary | ICD-10-CM | POA: Diagnosis not present

## 2017-06-12 DIAGNOSIS — Z23 Encounter for immunization: Secondary | ICD-10-CM | POA: Diagnosis not present

## 2017-06-15 DIAGNOSIS — D509 Iron deficiency anemia, unspecified: Secondary | ICD-10-CM | POA: Diagnosis not present

## 2017-06-15 DIAGNOSIS — D631 Anemia in chronic kidney disease: Secondary | ICD-10-CM | POA: Diagnosis not present

## 2017-06-15 DIAGNOSIS — Z992 Dependence on renal dialysis: Secondary | ICD-10-CM | POA: Diagnosis not present

## 2017-06-15 DIAGNOSIS — Z23 Encounter for immunization: Secondary | ICD-10-CM | POA: Diagnosis not present

## 2017-06-15 DIAGNOSIS — N186 End stage renal disease: Secondary | ICD-10-CM | POA: Diagnosis not present

## 2017-06-17 DIAGNOSIS — D631 Anemia in chronic kidney disease: Secondary | ICD-10-CM | POA: Diagnosis not present

## 2017-06-17 DIAGNOSIS — N186 End stage renal disease: Secondary | ICD-10-CM | POA: Diagnosis not present

## 2017-06-17 DIAGNOSIS — Z23 Encounter for immunization: Secondary | ICD-10-CM | POA: Diagnosis not present

## 2017-06-17 DIAGNOSIS — Z992 Dependence on renal dialysis: Secondary | ICD-10-CM | POA: Diagnosis not present

## 2017-06-17 DIAGNOSIS — D509 Iron deficiency anemia, unspecified: Secondary | ICD-10-CM | POA: Diagnosis not present

## 2017-06-19 DIAGNOSIS — D509 Iron deficiency anemia, unspecified: Secondary | ICD-10-CM | POA: Diagnosis not present

## 2017-06-19 DIAGNOSIS — Z992 Dependence on renal dialysis: Secondary | ICD-10-CM | POA: Diagnosis not present

## 2017-06-19 DIAGNOSIS — D631 Anemia in chronic kidney disease: Secondary | ICD-10-CM | POA: Diagnosis not present

## 2017-06-19 DIAGNOSIS — N186 End stage renal disease: Secondary | ICD-10-CM | POA: Diagnosis not present

## 2017-06-19 DIAGNOSIS — Z23 Encounter for immunization: Secondary | ICD-10-CM | POA: Diagnosis not present

## 2017-06-20 DIAGNOSIS — N186 End stage renal disease: Secondary | ICD-10-CM | POA: Diagnosis not present

## 2017-06-20 DIAGNOSIS — Z992 Dependence on renal dialysis: Secondary | ICD-10-CM | POA: Diagnosis not present

## 2017-06-20 DIAGNOSIS — Z23 Encounter for immunization: Secondary | ICD-10-CM | POA: Diagnosis not present

## 2017-06-22 DIAGNOSIS — N186 End stage renal disease: Secondary | ICD-10-CM | POA: Diagnosis not present

## 2017-06-22 DIAGNOSIS — D509 Iron deficiency anemia, unspecified: Secondary | ICD-10-CM | POA: Diagnosis not present

## 2017-06-22 DIAGNOSIS — Z992 Dependence on renal dialysis: Secondary | ICD-10-CM | POA: Diagnosis not present

## 2017-06-22 DIAGNOSIS — Z23 Encounter for immunization: Secondary | ICD-10-CM | POA: Diagnosis not present

## 2017-06-22 DIAGNOSIS — D631 Anemia in chronic kidney disease: Secondary | ICD-10-CM | POA: Diagnosis not present

## 2017-06-24 DIAGNOSIS — Z992 Dependence on renal dialysis: Secondary | ICD-10-CM | POA: Diagnosis not present

## 2017-06-24 DIAGNOSIS — N186 End stage renal disease: Secondary | ICD-10-CM | POA: Diagnosis not present

## 2017-06-24 DIAGNOSIS — D631 Anemia in chronic kidney disease: Secondary | ICD-10-CM | POA: Diagnosis not present

## 2017-06-24 DIAGNOSIS — Z23 Encounter for immunization: Secondary | ICD-10-CM | POA: Diagnosis not present

## 2017-06-24 DIAGNOSIS — D509 Iron deficiency anemia, unspecified: Secondary | ICD-10-CM | POA: Diagnosis not present

## 2017-06-25 ENCOUNTER — Ambulatory Visit (INDEPENDENT_AMBULATORY_CARE_PROVIDER_SITE_OTHER): Payer: Medicare HMO | Admitting: Vascular Surgery

## 2017-06-25 ENCOUNTER — Encounter (INDEPENDENT_AMBULATORY_CARE_PROVIDER_SITE_OTHER): Payer: Self-pay | Admitting: Vascular Surgery

## 2017-06-25 ENCOUNTER — Other Ambulatory Visit (INDEPENDENT_AMBULATORY_CARE_PROVIDER_SITE_OTHER): Payer: Self-pay | Admitting: Vascular Surgery

## 2017-06-25 ENCOUNTER — Ambulatory Visit (INDEPENDENT_AMBULATORY_CARE_PROVIDER_SITE_OTHER): Payer: Medicare HMO

## 2017-06-25 VITALS — BP 149/68 | HR 74 | Resp 16 | Ht 63.0 in | Wt 174.0 lb

## 2017-06-25 DIAGNOSIS — T829XXD Unspecified complication of cardiac and vascular prosthetic device, implant and graft, subsequent encounter: Secondary | ICD-10-CM

## 2017-06-25 DIAGNOSIS — N186 End stage renal disease: Secondary | ICD-10-CM

## 2017-06-25 DIAGNOSIS — I159 Secondary hypertension, unspecified: Secondary | ICD-10-CM | POA: Diagnosis not present

## 2017-06-25 DIAGNOSIS — T829XXS Unspecified complication of cardiac and vascular prosthetic device, implant and graft, sequela: Secondary | ICD-10-CM

## 2017-06-25 DIAGNOSIS — Z992 Dependence on renal dialysis: Secondary | ICD-10-CM

## 2017-06-25 NOTE — Progress Notes (Signed)
MRN : [id number]  Joyce [name] is a 71 y.o. (October 19, 1945) female who presents with chief complaint of  Chief Complaint  Patient presents with  . Follow-up    2wk HDA  .  History of Present Illness:The patient returns to the office for followup of their dialysis access. She is s/p Percutaneous transluminal angioplasty cephalic vein at the confluence with the subclavian vein on 06/09/2017  The function of the access has been stable. The patient denies increased bleeding time or increased recirculation. Patient denies difficulty with cannulation. The patient denies hand pain or other symptoms consistent with steal phenomena.  No significant arm swelling.  The patient denies redness or swelling at the access site. The patient denies fever or chills at home or while on dialysis.  The patient denies amaurosis fugax or recent TIA symptoms. There are no recent neurological changes noted. The patient denies claudication symptoms or rest pain symptoms. The patient denies history of DVT, PE or superficial thrombophlebitis. The patient denies recent episodes of angina or shortness of breath.          Current Meds  Medication Sig  . acetaminophen (TYLENOL) 325 MG tablet Take 325 mg 2 (two) times daily as needed by mouth for moderate pain or headache.   . brimonidine (ALPHAGAN) 0.2 % ophthalmic solution Place 2 (two) times daily into both eyes.  . carbamazepine (TEGRETOL) 200 MG tablet Take 200 mg by mouth 2 (two) times daily.  . cholecalciferol (VITAMIN D) 1000 UNITS tablet Take 1,000 Units by mouth daily.  . cyanocobalamin (,VITAMIN B-12,) 1000 MCG/ML injection Inject 1,000 mcg into the muscle every 30 (thirty) days.  Marland Kitchen latanoprost (XALATAN) 0.005 % ophthalmic solution Place 1 drop into both eyes at bedtime.   . lidocaine-prilocaine (EMLA) cream Apply 1 application every Monday, Wednesday, and Friday topically. At dialysis  . pramipexole (MIRAPEX) 0.125 MG tablet Take 0.125 mg by mouth  daily at 2 PM.  . QUEtiapine (SEROQUEL) 25 MG tablet Take 50 mg by mouth at bedtime.   . timolol (TIMOPTIC) 0.5 % ophthalmic solution Place 1 drop 2 (two) times daily into both eyes.   . verapamil (CALAN-SR) 240 MG CR tablet Take 240 mg daily at 3 pm by mouth.     Past Medical History:  Diagnosis Date  . Anal fissure   . Bipolar affective disorder ([place])   . CKD (chronic kidney disease)    Dr Holley Raring [name] 4  . Colon polyps   . Diverticulitis   . Diverticulitis   . Family history of adverse reaction to anesthesia    mom - PONV  . GERD (gastroesophageal reflux disease)   . Headache    migraines - none over 10 yrs  . Heart murmur   . History of hiatal hernia   . Hypertension   . Pancreatitis    Valproic acid  . Vertigo     Past Surgical History:  Procedure Laterality Date  . A/V FISTULAGRAM Left 04/14/2017   Procedure: A/V Fistulagram;  Surgeon: Katha Cabal, MD;  Location: [place] [facility];  Service: Cardiovascular;  Laterality: Left;  . A/V FISTULAGRAM Left 06/09/2017   Procedure: A/V FISTULAGRAM;  Surgeon: Katha Cabal, MD;  Location: [place] [facility];  Service: Cardiovascular;  Laterality: Left;  . ABDOMINAL HYSTERECTOMY  1990 ?  . AV FISTULA PLACEMENT  4/30  . BREAST EXCISIONAL BIOPSY Left 1994   neg surgical bx  . CHOLECYSTECTOMY  2003  . COLONOSCOPY  2014  Dr. Jamal Collin  . COLONOSCOPY WITH PROPOFOL N/A 09/24/2015   Procedure: COLONOSCOPY WITH random colon byopies.;  Surgeon: Lucilla Lame, MD;  Location: [place];  Service: Endoscopy;  Laterality: N/A;  . ESOPHAGOGASTRODUODENOSCOPY (EGD) WITH PROPOFOL N/A 09/24/2015   Procedure: ESOPHAGOGASTRODUODENOSCOPY (EGD) ;  Surgeon: Lucilla Lame, MD;  Location: [place];  Service: Endoscopy;  Laterality: N/A;  . EYE SURGERY    . PERIPHERAL VASCULAR CATHETERIZATION N/A 05/29/2015   Procedure: A/V Shuntogram/Fistulagram;  Surgeon: Katha Cabal, MD;  Location: [place] [facility];  Service:  Cardiovascular;  Laterality: N/A;  . PERIPHERAL VASCULAR CATHETERIZATION N/A 05/29/2015   Procedure: A/V Shunt Intervention;  Surgeon: Katha Cabal, MD;  Location: [place] [facility];  Service: Cardiovascular;  Laterality: N/A;  . POLYPECTOMY  09/24/2015   Procedure: POLYPECTOMY INTESTINAL;  Surgeon: Lucilla Lame, MD;  Location: [place];  Service: Endoscopy;;  cecal polyp ascending polyp    Social History Social History   Tobacco Use  . Smoking status: Never Smoker  . Smokeless tobacco: Never Used  Substance Use Topics  . Alcohol use: No    Alcohol/week: 0.0 oz  . Drug use: No    Family History Family History  Problem Relation Age of Onset  . Stroke Father   . Hypertension Father   . Breast cancer Other   . Colon cancer Neg Hx   . Liver disease Neg Hx     Allergies  Allergen Reactions  . Morphine And Related Shortness Of Breath    Pt reports chest pain and difficulty breathing.  . Indomethacin Hives  . Pollen Extract Other (See Comments)    Sinus problems and HA     REVIEW OF SYSTEMS (Negative unless checked)  Constitutional: [] Weight loss  [] Fever  [] Chills Cardiac: [] Chest pain   [] Chest pressure   [] Palpitations   [] Shortness of breath when laying flat   [] Shortness of breath with exertion. Vascular:  [] Pain in legs with walking   [] Pain in legs at rest  [] History of DVT   [] Phlebitis   [] Swelling in legs   [] Varicose veins   [] Non-healing ulcers Pulmonary:   [] Uses home oxygen   [] Productive cough   [] Hemoptysis   [] Wheeze  [] COPD   [] Asthma Neurologic:  [] Dizziness   [] Seizures   [] History of stroke   [] History of TIA  [] Aphasia   [] Vissual changes   [] Weakness or numbness in arm   [] Weakness or numbness in leg Musculoskeletal:   [] Joint swelling   [] Joint pain   [] Low back pain Hematologic:  [] Easy bruising  [] Easy bleeding   [] Hypercoagulable state   [] Anemic Gastrointestinal:  [] Diarrhea   [] Vomiting  [] Gastroesophageal reflux/heartburn    [] Difficulty swallowing. Genitourinary:  [] Chronic kidney disease   [] Difficult urination  [] Frequent urination   [] Blood in urine Skin:  [] Rashes   [] Ulcers  Psychological:  [] History of anxiety   []  History of major depression.  Physical Examination  Vitals:   06/25/17 1153  BP: (!) 149/68  Pulse: 74  Resp: 16  Weight: 174 lb (78.9 kg)  Height: 5\' 3"  (1.6 m)   Body mass index is 30.82 kg/m. Gen: WD/WN, NAD Head: [place]/AT, No temporalis wasting.  Ear/Nose/Throat: Hearing grossly intact, nares w/o erythema or drainage Eyes: PER, EOMI, sclera nonicteric.  Neck: Supple, no large masses.   Pulmonary:  Good air movement, no audible wheezing bilaterally, no use of accessory muscles.  Cardiac: RRR, no JVD Vascular:  Left brachial cephalic fistula good thrill good bruit Vessel  Right Left  Radial Palpable Palpable  Ulnar Palpable Palpable  Brachial Palpable Palpable  Gastrointestinal: Non-distended. No guarding/no peritoneal signs.  Musculoskeletal: M/S 5/5 throughout.  No deformity or atrophy.  Neurologic: CN 2-12 intact. Symmetrical.  Speech is fluent. Motor exam as listed above. Psychiatric: Judgment intact, Mood & affect appropriate for pt's clinical situation. Dermatologic: No rashes or ulcers noted.  No changes consistent with cellulitis. Lymph : No lichenification or skin changes of chronic lymphedema.  CBC Lab Results  Component Value Date   WBC 5.7 03/11/2017   HGB 10.0 (L) 03/11/2017   HCT 28.9 (L) 03/11/2017   MCV 89.1 03/11/2017   PLT 332 03/11/2017    BMET    Component Value Date/Time   NA 138 03/11/2017 1042   NA 139 11/09/2014 1027   K 4.7 03/11/2017 1042   K 4.7 11/09/2014 1027   CL 105 03/11/2017 1042   CL 108 11/09/2014 1027   CO2 23 03/11/2017 1042   CO2 25 11/09/2014 1027   GLUCOSE 116 (H) 03/11/2017 1042   GLUCOSE 100 (H) 11/09/2014 1027   BUN 52 (H) 03/11/2017 1042   BUN 51 (H) 11/09/2014 1027   CREATININE 4.27 (H) 03/11/2017 1042   CREATININE  2.98 (H) 11/09/2014 1027   CALCIUM 9.2 03/11/2017 1042   CALCIUM 8.9 11/09/2014 1027   GFRNONAA 10 (L) 03/11/2017 1042   GFRNONAA 15 (L) 11/09/2014 1027   GFRAA 11 (L) 03/11/2017 1042   GFRAA 18 (L) 11/09/2014 1027   CrCl cannot be calculated (Patient's most recent lab result is older than the maximum 21 days allowed.).  COAG Lab Results  Component Value Date   INR 1.08 10/03/2016   INR 1.0 11/09/2014    Radiology No results found.   Assessment/Plan 1. ESRD on dialysis [facility]) Recommend:  The patient is doing well and currently has adequate dialysis access. The patient's dialysis center is not reporting any access issues. Flow pattern is stable when compared to the prior ultrasound.  The patient should have a duplex ultrasound of the dialysis access in 6 months. The patient will follow-up with me in the office after each ultrasound     2. Complication of vascular access for dialysis, sequela Stricture is stable so I will follow for now  3. Secondary hypertension Continue antihypertensive medications as already ordered, these medications have been reviewed and there are no changes at this time.     [name], MD  06/25/2017 12:00 PM

## 2017-06-26 DIAGNOSIS — D509 Iron deficiency anemia, unspecified: Secondary | ICD-10-CM | POA: Diagnosis not present

## 2017-06-26 DIAGNOSIS — D631 Anemia in chronic kidney disease: Secondary | ICD-10-CM | POA: Diagnosis not present

## 2017-06-26 DIAGNOSIS — N186 End stage renal disease: Secondary | ICD-10-CM | POA: Diagnosis not present

## 2017-06-26 DIAGNOSIS — Z992 Dependence on renal dialysis: Secondary | ICD-10-CM | POA: Diagnosis not present

## 2017-06-26 DIAGNOSIS — Z23 Encounter for immunization: Secondary | ICD-10-CM | POA: Diagnosis not present

## 2017-06-27 DIAGNOSIS — N186 End stage renal disease: Secondary | ICD-10-CM | POA: Diagnosis not present

## 2017-06-27 DIAGNOSIS — Z23 Encounter for immunization: Secondary | ICD-10-CM | POA: Diagnosis not present

## 2017-06-27 DIAGNOSIS — D631 Anemia in chronic kidney disease: Secondary | ICD-10-CM | POA: Diagnosis not present

## 2017-06-27 DIAGNOSIS — D509 Iron deficiency anemia, unspecified: Secondary | ICD-10-CM | POA: Diagnosis not present

## 2017-06-27 DIAGNOSIS — Z992 Dependence on renal dialysis: Secondary | ICD-10-CM | POA: Diagnosis not present

## 2017-06-30 ENCOUNTER — Telehealth: Payer: Self-pay | Admitting: Gastroenterology

## 2017-06-30 DIAGNOSIS — Z992 Dependence on renal dialysis: Secondary | ICD-10-CM | POA: Diagnosis not present

## 2017-06-30 DIAGNOSIS — D631 Anemia in chronic kidney disease: Secondary | ICD-10-CM | POA: Diagnosis not present

## 2017-06-30 DIAGNOSIS — Z23 Encounter for immunization: Secondary | ICD-10-CM | POA: Diagnosis not present

## 2017-06-30 DIAGNOSIS — N186 End stage renal disease: Secondary | ICD-10-CM | POA: Diagnosis not present

## 2017-06-30 DIAGNOSIS — D509 Iron deficiency anemia, unspecified: Secondary | ICD-10-CM | POA: Diagnosis not present

## 2017-06-30 NOTE — Telephone Encounter (Signed)
Patient lVM to call and schedule appt NO ANSWER

## 2017-07-01 DIAGNOSIS — D631 Anemia in chronic kidney disease: Secondary | ICD-10-CM | POA: Diagnosis not present

## 2017-07-01 DIAGNOSIS — N186 End stage renal disease: Secondary | ICD-10-CM | POA: Diagnosis not present

## 2017-07-01 DIAGNOSIS — Z23 Encounter for immunization: Secondary | ICD-10-CM | POA: Diagnosis not present

## 2017-07-01 DIAGNOSIS — Z992 Dependence on renal dialysis: Secondary | ICD-10-CM | POA: Diagnosis not present

## 2017-07-01 DIAGNOSIS — D509 Iron deficiency anemia, unspecified: Secondary | ICD-10-CM | POA: Diagnosis not present

## 2017-07-03 DIAGNOSIS — Z992 Dependence on renal dialysis: Secondary | ICD-10-CM | POA: Diagnosis not present

## 2017-07-03 DIAGNOSIS — N186 End stage renal disease: Secondary | ICD-10-CM | POA: Diagnosis not present

## 2017-07-03 DIAGNOSIS — D509 Iron deficiency anemia, unspecified: Secondary | ICD-10-CM | POA: Diagnosis not present

## 2017-07-03 DIAGNOSIS — D631 Anemia in chronic kidney disease: Secondary | ICD-10-CM | POA: Diagnosis not present

## 2017-07-03 DIAGNOSIS — Z23 Encounter for immunization: Secondary | ICD-10-CM | POA: Diagnosis not present

## 2017-07-06 DIAGNOSIS — D631 Anemia in chronic kidney disease: Secondary | ICD-10-CM | POA: Diagnosis not present

## 2017-07-06 DIAGNOSIS — Z23 Encounter for immunization: Secondary | ICD-10-CM | POA: Diagnosis not present

## 2017-07-06 DIAGNOSIS — Z992 Dependence on renal dialysis: Secondary | ICD-10-CM | POA: Diagnosis not present

## 2017-07-06 DIAGNOSIS — H401132 Primary open-angle glaucoma, bilateral, moderate stage: Secondary | ICD-10-CM | POA: Diagnosis not present

## 2017-07-06 DIAGNOSIS — D509 Iron deficiency anemia, unspecified: Secondary | ICD-10-CM | POA: Diagnosis not present

## 2017-07-06 DIAGNOSIS — N186 End stage renal disease: Secondary | ICD-10-CM | POA: Diagnosis not present

## 2017-07-07 DIAGNOSIS — H401132 Primary open-angle glaucoma, bilateral, moderate stage: Secondary | ICD-10-CM | POA: Diagnosis not present

## 2017-07-08 ENCOUNTER — Ambulatory Visit: Payer: Medicare HMO | Admitting: Gastroenterology

## 2017-07-08 ENCOUNTER — Encounter: Payer: Self-pay | Admitting: Gastroenterology

## 2017-07-08 VITALS — BP 145/67 | HR 79 | Ht 63.0 in | Wt 174.5 lb

## 2017-07-08 DIAGNOSIS — N186 End stage renal disease: Secondary | ICD-10-CM | POA: Diagnosis not present

## 2017-07-08 DIAGNOSIS — Z23 Encounter for immunization: Secondary | ICD-10-CM | POA: Diagnosis not present

## 2017-07-08 DIAGNOSIS — F3177 Bipolar disorder, in partial remission, most recent episode mixed: Secondary | ICD-10-CM | POA: Diagnosis not present

## 2017-07-08 DIAGNOSIS — D631 Anemia in chronic kidney disease: Secondary | ICD-10-CM | POA: Diagnosis not present

## 2017-07-08 DIAGNOSIS — R197 Diarrhea, unspecified: Secondary | ICD-10-CM | POA: Diagnosis not present

## 2017-07-08 DIAGNOSIS — D509 Iron deficiency anemia, unspecified: Secondary | ICD-10-CM | POA: Diagnosis not present

## 2017-07-08 DIAGNOSIS — Z992 Dependence on renal dialysis: Secondary | ICD-10-CM | POA: Diagnosis not present

## 2017-07-08 MED ORDER — DICYCLOMINE HCL 20 MG PO TABS: 20.0000 mg | ORAL_TABLET | Freq: Three times a day (TID) | ORAL | 3 refills | Status: DC

## 2017-07-08 NOTE — Progress Notes (Signed)
Primary Care Physician: Cletis Athens, MD  Primary Gastroenterologist:  Dr. Lucilla Lame  Chief Complaint  Patient presents with  . Follow up diarrhea    HPI: Pa Tennant is a 71 y.o. female here for a report of diarrhea. The patient had seen me in 2017 for diarrhea then was on pain medication cells with to that and was seen for constipation. The patient now reports that the diarrhea has come back and it is worse now in the last 3 months that it had been in the past. She also states that she lost 5 pounds in the last 3 months. She states that the only thing that happened around that time was that she had a port placed for hemodialysis. She reports that she has diarrhea usually after she eats. She states that they are not able to take off much fluid from her during dialysis because she is dehydrated from the diarrhea.  Current Outpatient Medications  Medication Sig Dispense Refill  . acetaminophen (TYLENOL) 325 MG tablet Take 325 mg 2 (two) times daily as needed by mouth for moderate pain or headache.     . brimonidine (ALPHAGAN) 0.2 % ophthalmic solution Place 2 (two) times daily into both eyes.    . carbamazepine (TEGRETOL) 200 MG tablet Take 200 mg by mouth 2 (two) times daily.    . cholecalciferol (VITAMIN D) 1000 UNITS tablet Take 1,000 Units by mouth daily.    . cyanocobalamin (,VITAMIN B-12,) 1000 MCG/ML injection Inject 1,000 mcg into the muscle every 30 (thirty) days.    Marland Kitchen latanoprost (XALATAN) 0.005 % ophthalmic solution Place 1 drop into both eyes at bedtime.     . pramipexole (MIRAPEX) 0.125 MG tablet Take 0.125 mg by mouth daily at 2 PM.  3  . QUEtiapine (SEROQUEL) 25 MG tablet Take 50 mg by mouth at bedtime.     . timolol (TIMOPTIC) 0.5 % ophthalmic solution Place 1 drop 2 (two) times daily into both eyes.     . bisacodyl (DULCOLAX) 5 MG EC tablet Take 1 tablet (5 mg total) by mouth daily as needed for moderate constipation. (Patient not taking: Reported on 04/09/2017) 30  tablet 0  . lidocaine-prilocaine (EMLA) cream Apply 1 application every Monday, Wednesday, and Friday topically. At dialysis  3  . midodrine (PROAMATINE) 10 MG tablet     . oxyCODONE-acetaminophen (PERCOCET/ROXICET) 5-325 MG tablet Take 1 tablet by mouth every 6 (six) hours as needed for moderate pain or severe pain. (Patient not taking: Reported on 03/11/2017) 25 tablet 0  . pantoprazole (PROTONIX) 40 MG tablet TAKE 1 TABLET EVERY DAY (Patient not taking: Reported on 06/09/2017) 90 tablet 3  . traMADol (ULTRAM) 50 MG tablet Take 1 tablet (50 mg total) by mouth every 6 (six) hours as needed for moderate pain or severe pain. (Patient not taking: Reported on 03/11/2017) 12 tablet 0  . verapamil (CALAN-SR) 240 MG CR tablet Take 240 mg daily at 3 pm by mouth.      No current facility-administered medications for this visit.     Allergies as of 07/08/2017 - Review Complete 07/08/2017  Allergen Reaction Noted  . Morphine and related Shortness Of Breath 10/09/2016  . Indomethacin Hives 05/10/2013  . Pollen extract Other (See Comments) 12/25/2014    ROS:  General: Negative for anorexia, weight loss, fever, chills, fatigue, weakness. ENT: Negative for hoarseness, difficulty swallowing , nasal congestion. CV: Negative for chest pain, angina, palpitations, dyspnea on exertion, peripheral edema.  Respiratory: Negative for  dyspnea at rest, dyspnea on exertion, cough, sputum, wheezing.  GI: See history of present illness. GU:  Negative for dysuria, hematuria, urinary incontinence, urinary frequency, nocturnal urination.  Endo: Negative for unusual weight change.    Physical Examination:   BP (!) 145/67   Pulse 79   Ht 5\' 3"  (1.6 m)   Wt 174 lb 8 oz (79.2 kg)   BMI 30.91 kg/m   General: Well-nourished, well-developed in no acute distress.  Eyes: No icterus. Conjunctivae pink. Mouth: Oropharyngeal mucosa moist and pink , no lesions erythema or exudate. Lungs: Clear to auscultation bilaterally.  Non-labored. Heart: Regular rate and rhythm, no murmurs rubs or gallops.  Abdomen: Bowel sounds are normal, nontender, nondistended, no hepatosplenomegaly or masses, no abdominal bruits or hernia , no rebound or guarding.   Extremities: No lower extremity edema. No clubbing or deformities. Neuro: Alert and oriented x 3.  Grossly intact. Skin: Warm and dry, no jaundice.   Psych: Alert and cooperative, normal mood and affect.  Labs:    Imaging Studies: No results found.  Assessment and Plan:   Anette Barra is a 71 y.o. y/o female who comes in with diarrhea. The patient had a colonoscopy that showed some lymphocytic infiltrate of the small bowel but a normal colonoscopy except for colon polyps but the random biopsies were normal. The patient has been told that the lymphocytic infiltrate can be caused by anti-inflammatory medication versus celiac sprue versus a viral infection versus bacterial overgrowth and is somewhat nonspecific. The patient will have her stool sent off for pathogens and will be started on dicyclomine for possible irritable bowel syndrome since she has to move her bowels right after eating. The patient has been told that if that does not relieve her symptoms and one week that she should start titrating Imodium to have solid bowel movements. She has been told that does not work we can try Lomotil versus Viberzi. We may also consider treating for possible small bowel overgrowth. The patient has been explained the plan and agrees with it    Lucilla Lame, MD. Marval Regal   Note: This dictation was prepared with Dragon dictation along with smaller phrase technology. Any transcriptional errors that result from this process are unintentional.

## 2017-07-09 ENCOUNTER — Other Ambulatory Visit
Admission: RE | Admit: 2017-07-09 | Discharge: 2017-07-09 | Disposition: A | Discharge status: Home / Self Care | Payer: Medicare HMO | Source: Ambulatory Visit | Attending: Gastroenterology | Admitting: Gastroenterology

## 2017-07-09 ENCOUNTER — Encounter: Payer: Self-pay | Admitting: Gastroenterology

## 2017-07-09 DIAGNOSIS — R197 Diarrhea, unspecified: Secondary | ICD-10-CM | POA: Diagnosis not present

## 2017-07-09 LAB — GASTROINTESTINAL PANEL BY PCR, STOOL (REPLACES STOOL CULTURE)

## 2017-07-10 DIAGNOSIS — N186 End stage renal disease: Secondary | ICD-10-CM | POA: Diagnosis not present

## 2017-07-10 DIAGNOSIS — D631 Anemia in chronic kidney disease: Secondary | ICD-10-CM | POA: Diagnosis not present

## 2017-07-10 DIAGNOSIS — D509 Iron deficiency anemia, unspecified: Secondary | ICD-10-CM | POA: Diagnosis not present

## 2017-07-10 DIAGNOSIS — Z23 Encounter for immunization: Secondary | ICD-10-CM | POA: Diagnosis not present

## 2017-07-10 DIAGNOSIS — Z992 Dependence on renal dialysis: Secondary | ICD-10-CM | POA: Diagnosis not present

## 2017-07-12 DIAGNOSIS — N186 End stage renal disease: Secondary | ICD-10-CM | POA: Diagnosis not present

## 2017-07-12 DIAGNOSIS — Z992 Dependence on renal dialysis: Secondary | ICD-10-CM | POA: Diagnosis not present

## 2017-07-15 DIAGNOSIS — Z992 Dependence on renal dialysis: Secondary | ICD-10-CM | POA: Diagnosis not present

## 2017-07-15 DIAGNOSIS — N186 End stage renal disease: Secondary | ICD-10-CM | POA: Diagnosis not present

## 2017-07-16 ENCOUNTER — Telehealth: Payer: Self-pay

## 2017-07-16 NOTE — Telephone Encounter (Signed)
Left vm with lab results.

## 2017-07-16 NOTE — Telephone Encounter (Signed)
-----   Message from Lucilla Lame, MD sent at 07/09/2017  5:27 PM EST ----- Let the patient know that her GI panel was negative

## 2017-07-17 DIAGNOSIS — N186 End stage renal disease: Secondary | ICD-10-CM | POA: Diagnosis not present

## 2017-07-17 DIAGNOSIS — Z992 Dependence on renal dialysis: Secondary | ICD-10-CM | POA: Diagnosis not present

## 2017-07-20 DIAGNOSIS — N186 End stage renal disease: Secondary | ICD-10-CM | POA: Diagnosis not present

## 2017-07-20 DIAGNOSIS — Z992 Dependence on renal dialysis: Secondary | ICD-10-CM | POA: Diagnosis not present

## 2017-07-20 DIAGNOSIS — D631 Anemia in chronic kidney disease: Secondary | ICD-10-CM | POA: Diagnosis not present

## 2017-07-20 DIAGNOSIS — D509 Iron deficiency anemia, unspecified: Secondary | ICD-10-CM | POA: Diagnosis not present

## 2017-07-20 DIAGNOSIS — Z23 Encounter for immunization: Secondary | ICD-10-CM | POA: Diagnosis not present

## 2017-07-22 DIAGNOSIS — Z992 Dependence on renal dialysis: Secondary | ICD-10-CM | POA: Diagnosis not present

## 2017-07-22 DIAGNOSIS — N186 End stage renal disease: Secondary | ICD-10-CM | POA: Diagnosis not present

## 2017-07-22 DIAGNOSIS — Z23 Encounter for immunization: Secondary | ICD-10-CM | POA: Diagnosis not present

## 2017-07-24 DIAGNOSIS — Z23 Encounter for immunization: Secondary | ICD-10-CM | POA: Diagnosis not present

## 2017-07-24 DIAGNOSIS — Z992 Dependence on renal dialysis: Secondary | ICD-10-CM | POA: Diagnosis not present

## 2017-07-24 DIAGNOSIS — N186 End stage renal disease: Secondary | ICD-10-CM | POA: Diagnosis not present

## 2017-07-27 DIAGNOSIS — Z992 Dependence on renal dialysis: Secondary | ICD-10-CM | POA: Diagnosis not present

## 2017-07-27 DIAGNOSIS — Z23 Encounter for immunization: Secondary | ICD-10-CM | POA: Diagnosis not present

## 2017-07-27 DIAGNOSIS — N186 End stage renal disease: Secondary | ICD-10-CM | POA: Diagnosis not present

## 2017-07-29 DIAGNOSIS — Z992 Dependence on renal dialysis: Secondary | ICD-10-CM | POA: Diagnosis not present

## 2017-07-29 DIAGNOSIS — N186 End stage renal disease: Secondary | ICD-10-CM | POA: Diagnosis not present

## 2017-07-29 DIAGNOSIS — Z23 Encounter for immunization: Secondary | ICD-10-CM | POA: Diagnosis not present

## 2017-07-31 DIAGNOSIS — Z23 Encounter for immunization: Secondary | ICD-10-CM | POA: Diagnosis not present

## 2017-07-31 DIAGNOSIS — N186 End stage renal disease: Secondary | ICD-10-CM | POA: Diagnosis not present

## 2017-07-31 DIAGNOSIS — Z992 Dependence on renal dialysis: Secondary | ICD-10-CM | POA: Diagnosis not present

## 2017-08-03 DIAGNOSIS — Z992 Dependence on renal dialysis: Secondary | ICD-10-CM | POA: Diagnosis not present

## 2017-08-03 DIAGNOSIS — Z23 Encounter for immunization: Secondary | ICD-10-CM | POA: Diagnosis not present

## 2017-08-03 DIAGNOSIS — N186 End stage renal disease: Secondary | ICD-10-CM | POA: Diagnosis not present

## 2017-08-05 DIAGNOSIS — Z23 Encounter for immunization: Secondary | ICD-10-CM | POA: Diagnosis not present

## 2017-08-05 DIAGNOSIS — N186 End stage renal disease: Secondary | ICD-10-CM | POA: Diagnosis not present

## 2017-08-05 DIAGNOSIS — Z992 Dependence on renal dialysis: Secondary | ICD-10-CM | POA: Diagnosis not present

## 2017-08-07 DIAGNOSIS — Z23 Encounter for immunization: Secondary | ICD-10-CM | POA: Diagnosis not present

## 2017-08-07 DIAGNOSIS — Z992 Dependence on renal dialysis: Secondary | ICD-10-CM | POA: Diagnosis not present

## 2017-08-07 DIAGNOSIS — N186 End stage renal disease: Secondary | ICD-10-CM | POA: Diagnosis not present

## 2017-08-10 DIAGNOSIS — Z992 Dependence on renal dialysis: Secondary | ICD-10-CM | POA: Diagnosis not present

## 2017-08-10 DIAGNOSIS — N186 End stage renal disease: Secondary | ICD-10-CM | POA: Diagnosis not present

## 2017-08-10 DIAGNOSIS — Z23 Encounter for immunization: Secondary | ICD-10-CM | POA: Diagnosis not present

## 2017-08-12 DIAGNOSIS — Z992 Dependence on renal dialysis: Secondary | ICD-10-CM | POA: Diagnosis not present

## 2017-08-12 DIAGNOSIS — N186 End stage renal disease: Secondary | ICD-10-CM | POA: Diagnosis not present

## 2017-08-12 DIAGNOSIS — Z23 Encounter for immunization: Secondary | ICD-10-CM | POA: Diagnosis not present

## 2017-08-14 DIAGNOSIS — N186 End stage renal disease: Secondary | ICD-10-CM | POA: Diagnosis not present

## 2017-08-14 DIAGNOSIS — Z23 Encounter for immunization: Secondary | ICD-10-CM | POA: Diagnosis not present

## 2017-08-14 DIAGNOSIS — Z992 Dependence on renal dialysis: Secondary | ICD-10-CM | POA: Diagnosis not present

## 2017-08-17 ENCOUNTER — Other Ambulatory Visit: Payer: Self-pay

## 2017-08-17 ENCOUNTER — Emergency Department: Payer: Medicare HMO

## 2017-08-17 ENCOUNTER — Emergency Department
Admission: EM | Admit: 2017-08-17 | Discharge: 2017-08-18 | Disposition: A | Discharge status: Home / Self Care | Payer: Medicare HMO | Attending: Emergency Medicine | Admitting: Emergency Medicine

## 2017-08-17 ENCOUNTER — Encounter: Payer: Self-pay | Admitting: Emergency Medicine

## 2017-08-17 DIAGNOSIS — R1013 Epigastric pain: Secondary | ICD-10-CM | POA: Insufficient documentation

## 2017-08-17 DIAGNOSIS — I120 Hypertensive chronic kidney disease with stage 5 chronic kidney disease or end stage renal disease: Secondary | ICD-10-CM | POA: Diagnosis not present

## 2017-08-17 DIAGNOSIS — N186 End stage renal disease: Secondary | ICD-10-CM | POA: Diagnosis not present

## 2017-08-17 DIAGNOSIS — R079 Chest pain, unspecified: Secondary | ICD-10-CM | POA: Insufficient documentation

## 2017-08-17 DIAGNOSIS — Z79899 Other long term (current) drug therapy: Secondary | ICD-10-CM | POA: Diagnosis not present

## 2017-08-17 DIAGNOSIS — R112 Nausea with vomiting, unspecified: Secondary | ICD-10-CM | POA: Diagnosis not present

## 2017-08-17 DIAGNOSIS — Z23 Encounter for immunization: Secondary | ICD-10-CM | POA: Diagnosis not present

## 2017-08-17 DIAGNOSIS — Z992 Dependence on renal dialysis: Secondary | ICD-10-CM | POA: Diagnosis not present

## 2017-08-17 LAB — BASIC METABOLIC PANEL
Anion gap: 10 (ref 5–15)
BUN: 19 mg/dL (ref 6–20)
CHLORIDE: 94 mmol/L — AB (ref 101–111)
CO2: 30 mmol/L (ref 22–32)
CREATININE: 2.01 mg/dL — AB (ref 0.44–1.00)
Calcium: 8.5 mg/dL — ABNORMAL LOW (ref 8.9–10.3)
GFR calc Af Amer: 28 mL/min — ABNORMAL LOW (ref >60)
GFR calc non Af Amer: 24 mL/min — ABNORMAL LOW (ref >60)
GLUCOSE: 116 mg/dL — AB (ref 65–99)
Potassium: 3.5 mmol/L (ref 3.5–5.1)
Sodium: 134 mmol/L — ABNORMAL LOW (ref 135–145)

## 2017-08-17 LAB — CBC
HCT: 34.3 % — ABNORMAL LOW (ref 35.0–47.0)
Hemoglobin: 11.6 g/dL — ABNORMAL LOW (ref 12.0–16.0)
MCH: 32 pg (ref 26.0–34.0)
MCHC: 33.8 g/dL (ref 32.0–36.0)
MCV: 94.6 fL (ref 80.0–100.0)
PLATELETS: 269 10*3/uL (ref 150–440)
RBC: 3.63 MIL/uL — ABNORMAL LOW (ref 3.80–5.20)
RDW: 14.2 % (ref 11.5–14.5)
WBC: 9.7 10*3/uL (ref 3.6–11.0)

## 2017-08-17 LAB — TROPONIN I
Troponin I: <0.03 ng/mL (ref <0.03)
Troponin I: <0.03 ng/mL (ref <0.03)

## 2017-08-17 MED ORDER — GI COCKTAIL ~~LOC~~
30.0000 mL | Freq: Once | ORAL | Status: AC
  Administered 2017-08-17: 30 mL via ORAL
  Filled 2017-08-17: qty 30

## 2017-08-17 NOTE — ED Triage Notes (Signed)
First nurse note - pt ems from dialysis center and started having chest pain s/p treatment. Pain was 10/10 and described as pressure. Ems gave 1 nitro spray and pain decreased to 6/10. Ems applied 1" paste. Pain still 6/10.

## 2017-08-17 NOTE — ED Provider Notes (Signed)
[facility] Emergency Department Provider Note   ____________________________________________   I have reviewed the triage vital signs and the nursing notes.   HISTORY  Chief Complaint Chest Pain   History limited by: Not Limited   HPI Joyce [name] is a 72 y.o. female who presents to the emergency department today because of concerns for some abdominal pain and chest pain that occurred during dialysis.  The patient is here with her daughter and they both have some concerns about what is going on with her blood pressure dialysis as well.  The patient has had some of these issues for a long time.  The patient has had this pain in the past.  Additionally she has had problems with her blood pressure going up and down.  Sounds like there is some frustration about the fact that this continues.  She does not necessarily sound like there are any new symptoms today.  The pain was located both in the left chest as well as in the epigastric region.  The patient states she has a history of cholecystectomy.    Per medical record review patient has a history of CKD, on dialysis.   Past Medical History:  Diagnosis Date  . Anal fissure   . Bipolar affective disorder ([place])   . CKD (chronic kidney disease)    Dr Holley Raring [name] 4  . Colon polyps   . Diverticulitis   . Diverticulitis   . Family history of adverse reaction to anesthesia    mom - PONV  . GERD (gastroesophageal reflux disease)   . Headache    migraines - none over 10 yrs  . Heart murmur   . History of hiatal hernia   . Hypertension   . Pancreatitis    Valproic acid  . Vertigo     Patient Active Problem List   Diagnosis Date Noted  . ESRD on dialysis ([place]) 04/06/2017  . Complication of vascular access for dialysis 04/06/2017  . Hematuria 12/18/2016  . Polycystic kidney 10/12/2016  . Ruptured cyst of kidney 10/03/2016  . HTN (hypertension) 10/01/2016  . Chronic kidney disease 10/01/2016  .  Anemia associated with chronic renal failure 09/24/2016  . Acute kidney insufficiency 07/28/2016  . Noninfectious diarrhea   . Benign neoplasm of cecum   . Benign neoplasm of ascending colon   . Diarrhea   . Nausea   . Gastric polyp   . Chronic constipation 12/27/2014  . Nausea with vomiting 12/27/2014  . Left sided abdominal pain 12/27/2014  . Personal history of colonic polyps 05/10/2013    Past Surgical History:  Procedure Laterality Date  . A/V FISTULAGRAM Left 04/14/2017   Procedure: A/V Fistulagram;  Surgeon: Katha Cabal, MD;  Location: [place] [facility];  Service: Cardiovascular;  Laterality: Left;  . A/V FISTULAGRAM Left 06/09/2017   Procedure: A/V FISTULAGRAM;  Surgeon: Katha Cabal, MD;  Location: [place] [facility];  Service: Cardiovascular;  Laterality: Left;  . ABDOMINAL HYSTERECTOMY  1990 ?  . AV FISTULA PLACEMENT  4/30  . BREAST EXCISIONAL BIOPSY Left 1994   neg surgical bx  . CHOLECYSTECTOMY  2003  . COLONOSCOPY  2014   Dr. Jamal Collin  . COLONOSCOPY WITH PROPOFOL N/A 09/24/2015   Procedure: COLONOSCOPY WITH random colon byopies.;  Surgeon: Lucilla Lame, MD;  Location: [place];  Service: Endoscopy;  Laterality: N/A;  . ESOPHAGOGASTRODUODENOSCOPY (EGD) WITH PROPOFOL N/A 09/24/2015   Procedure: ESOPHAGOGASTRODUODENOSCOPY (EGD) ;  Surgeon: Lucilla Lame, MD;  Location:  [place];  Service: Endoscopy;  Laterality: N/A;  . EYE SURGERY    . PERIPHERAL VASCULAR CATHETERIZATION N/A 05/29/2015   Procedure: A/V Shuntogram/Fistulagram;  Surgeon: Katha Cabal, MD;  Location: [place] [facility];  Service: Cardiovascular;  Laterality: N/A;  . PERIPHERAL VASCULAR CATHETERIZATION N/A 05/29/2015   Procedure: A/V Shunt Intervention;  Surgeon: Katha Cabal, MD;  Location: [place] [facility];  Service: Cardiovascular;  Laterality: N/A;  . POLYPECTOMY  09/24/2015   Procedure: POLYPECTOMY INTESTINAL;  Surgeon: Lucilla Lame, MD;  Location: [place];  Service: Endoscopy;;  cecal polyp ascending polyp    Prior to Admission medications   Medication Sig Start Date End Date Taking? Authorizing Provider  acetaminophen (TYLENOL) 325 MG tablet Take 325 mg 2 (two) times daily as needed by mouth for moderate pain or headache.     [provider]  bisacodyl (DULCOLAX) 5 MG EC tablet Take 1 tablet (5 mg total) by mouth daily as needed for moderate constipation. Patient not taking: Reported on 04/09/2017 02/11/17   Vaughan Basta, MD  brimonidine (ALPHAGAN) 0.2 % ophthalmic solution Place 2 (two) times daily into both eyes.    [provider]  carbamazepine (TEGRETOL) 200 MG tablet Take 200 mg by mouth 2 (two) times daily. 02/13/17   [provider]  cholecalciferol (VITAMIN D) 1000 UNITS tablet Take 1,000 Units by mouth daily.    [provider]  cyanocobalamin (,VITAMIN B-12,) 1000 MCG/ML injection Inject 1,000 mcg into the muscle every 30 (thirty) days.    [provider]  dicyclomine (BENTYL) 20 MG tablet Take 1 tablet (20 mg total) by mouth 3 (three) times daily before meals. 07/08/17   Lucilla Lame, MD  latanoprost (XALATAN) 0.005 % ophthalmic solution Place 1 drop into both eyes at bedtime.     [provider]  lidocaine-prilocaine (EMLA) cream Apply 1 application every Monday, Wednesday, and Friday topically. At dialysis 05/06/17   [provider]  midodrine (PROAMATINE) 10 MG tablet  07/06/17   [provider]  oxyCODONE-acetaminophen (PERCOCET/ROXICET) 5-325 MG tablet Take 1 tablet by mouth every 6 (six) hours as needed for moderate pain or severe pain. Patient not taking: Reported on 03/11/2017 02/15/17   Henreitta Leber, MD  pantoprazole (PROTONIX) 40 MG tablet TAKE 1 TABLET EVERY DAY Patient not taking: Reported on 06/09/2017 10/08/16   Lucilla Lame, MD  pramipexole (MIRAPEX) 0.125 MG tablet Take 0.125 mg by mouth daily at 2 PM. 03/17/17   [provider]  QUEtiapine (SEROQUEL) 25 MG tablet Take 50 mg by mouth at bedtime.  02/13/17   [provider]  timolol (TIMOPTIC) 0.5 % ophthalmic solution Place 1 drop 2 (two) times daily into both eyes.     [provider]  traMADol (ULTRAM) 50 MG tablet Take 1 tablet (50 mg total) by mouth every 6 (six) hours as needed for moderate pain or severe pain. Patient not taking: Reported on 03/11/2017 02/11/17   Vaughan Basta, MD  verapamil (CALAN-SR) 240 MG CR tablet Take 240 mg daily at 3 pm by mouth.     [provider]    Allergies Morphine and related; Indomethacin; and Pollen extract  Family History  Problem Relation Age of Onset  . Stroke Father   . Hypertension Father   . Breast cancer Other   . Colon cancer Neg Hx   . Liver disease Neg Hx     Social History Social History   Tobacco Use  .  Smoking status: Never Smoker  . Smokeless tobacco: Never Used  Substance Use Topics  . Alcohol use: No    Alcohol/week: 0.0 oz  . Drug use: No    Review of Systems Constitutional: No fever/chills Eyes: No visual changes. ENT: No sore throat. Cardiovascular: Positive for chest pain. Respiratory: Denies shortness of breath. Gastrointestinal: Positive for abdominal pain. Genitourinary: Negative for dysuria. Musculoskeletal: Negative for back pain. Skin: Negative for rash. Neurological: Negative for headaches, focal weakness or numbness.  ____________________________________________   PHYSICAL EXAM:  VITAL SIGNS: ED Triage Vitals [08/17/17 1542]  Enc Vitals Group     BP (!) 160/55     Pulse Rate 66     Resp 18     Temp 97.9 F (36.6 C)     Temp Source Oral     SpO2 99 %     Weight 175 lb (79.4 kg)     Height 5\' 3"  (1.6 m)     Head Circumference      Peak Flow      Pain Score 6   Constitutional: Alert and oriented. Well appearing and in no distress. Eyes: Conjunctivae are normal.  ENT   Head: Normocephalic and atraumatic.    Nose: No congestion/rhinnorhea.   Mouth/Throat: Mucous membranes are moist.   Neck: No stridor. Hematological/Lymphatic/Immunilogical: No cervical lymphadenopathy. Cardiovascular: Normal rate, regular rhythm.  No murmurs, rubs, or gallops.  Respiratory: Normal respiratory effort without tachypnea nor retractions. Breath sounds are clear and equal bilaterally. No wheezes/rales/rhonchi. Gastrointestinal: Soft and non tender. No rebound. No guarding.  Genitourinary: Deferred Musculoskeletal: Normal range of motion in all extremities. No lower extremity edema. Neurologic:  Normal speech and language. No gross focal neurologic deficits are appreciated.  Skin:  Skin is warm, dry and intact. No rash noted. Psychiatric: Mood and affect are normal. Speech and behavior are normal. Patient exhibits appropriate insight and judgment.  ____________________________________________    LABS (pertinent positives/negatives)  Trop <0.03 CBC wbc 9.7, hgb 11.6, plt 269 BMP na 134, k 3.5, cr 2.01  ____________________________________________   EKG  I, Nance Pear, attending physician, personally viewed and interpreted this EKG  EKG Time: 1538 Rate: 66 Rhythm: normal sinus rhythm Axis: left axis deviation Intervals: qtc 480 QRS: incomplete RBBB, LAFB ST changes: no st elevation Impression: abnormal ekg   ____________________________________________    RADIOLOGY  CXR No acute disease   ____________________________________________   PROCEDURES  Procedures  ____________________________________________   INITIAL IMPRESSION / ASSESSMENT AND PLAN / ED COURSE  Pertinent labs & imaging results that were available during my care of the patient were reviewed by me and considered in my medical decision making (see chart for details).  Patient presented to the emergency department today because of concerns for both abdominal chest pain that occurred while the patient was at  dialysis today. It does sound like this has been a somewhat chronic issue as well as issue with blood pressure at dialysis. Blood work was obtained to evaluate for any concerning signs of infection, electrolyte abnormality, cardiac etiology of discomfort. Blood work without any obvious cause of the pain. Given the somewhat chronic nature I did have a discussion with patient and family about importance of continuing to follow up with primary care and nephrology.    ____________________________________________   FINAL CLINICAL IMPRESSION(S) / ED DIAGNOSES  Final diagnoses:  Nonspecific chest pain  Epigastric pain     Note: This dictation was prepared with Dragon dictation. Any transcriptional errors that result from this process are  unintentional     Nance Pear, MD 08/17/17 [phone]

## 2017-08-17 NOTE — ED Notes (Addendum)
Pt taken to room 13 via recliner; pt stood to stretcher without difficulty; pt placed in hosp gown & on card monitor for further eval; report given to care nurse

## 2017-08-17 NOTE — Discharge Instructions (Signed)
Please seek medical attention for any high fevers, chest pain, shortness of breath, change in behavior, persistent vomiting, bloody stool or any other new or concerning symptoms.  

## 2017-08-17 NOTE — ED Triage Notes (Signed)
Pt here for central/left chest pain that started today and has been intermittent.  Has had nausea and vomiting with pain.  NTG with EMS.  Has paste on now.  Unlabored. VSS

## 2017-08-18 NOTE — ED Notes (Signed)
Pt refused to sign topez for discharge.

## 2017-08-19 DIAGNOSIS — N186 End stage renal disease: Secondary | ICD-10-CM | POA: Diagnosis not present

## 2017-08-19 DIAGNOSIS — Z992 Dependence on renal dialysis: Secondary | ICD-10-CM | POA: Diagnosis not present

## 2017-08-20 DIAGNOSIS — Z992 Dependence on renal dialysis: Secondary | ICD-10-CM | POA: Diagnosis not present

## 2017-08-20 DIAGNOSIS — Z8659 Personal history of other mental and behavioral disorders: Secondary | ICD-10-CM | POA: Diagnosis not present

## 2017-08-20 DIAGNOSIS — N186 End stage renal disease: Secondary | ICD-10-CM | POA: Diagnosis not present

## 2017-08-20 DIAGNOSIS — J0100 Acute maxillary sinusitis, unspecified: Secondary | ICD-10-CM | POA: Diagnosis not present

## 2017-08-20 DIAGNOSIS — I953 Hypotension of hemodialysis: Secondary | ICD-10-CM | POA: Diagnosis not present

## 2017-08-20 DIAGNOSIS — R0602 Shortness of breath: Secondary | ICD-10-CM | POA: Diagnosis not present

## 2017-08-21 DIAGNOSIS — N186 End stage renal disease: Secondary | ICD-10-CM | POA: Diagnosis not present

## 2017-08-21 DIAGNOSIS — Z992 Dependence on renal dialysis: Secondary | ICD-10-CM | POA: Diagnosis not present

## 2017-08-25 ENCOUNTER — Telehealth: Payer: Self-pay

## 2017-08-25 ENCOUNTER — Telehealth: Payer: Self-pay | Admitting: Gastroenterology

## 2017-08-25 NOTE — Telephone Encounter (Signed)
Can you please call this pt and schedule a follow up to discuss GERD? Her last refill for protonix was 09/2016. Last appt was only to discuss chronic diarrhea. No mention of any GERD symptoms at that appt.   Thank you!!

## 2017-08-25 NOTE — Telephone Encounter (Signed)
Message forwarded to Ginger

## 2017-08-25 NOTE — Telephone Encounter (Signed)
Patient is still having reflux. What can she do?

## 2017-08-26 DIAGNOSIS — Z992 Dependence on renal dialysis: Secondary | ICD-10-CM | POA: Diagnosis not present

## 2017-08-26 DIAGNOSIS — N186 End stage renal disease: Secondary | ICD-10-CM | POA: Diagnosis not present

## 2017-08-28 DIAGNOSIS — N186 End stage renal disease: Secondary | ICD-10-CM | POA: Diagnosis not present

## 2017-08-28 DIAGNOSIS — Z992 Dependence on renal dialysis: Secondary | ICD-10-CM | POA: Diagnosis not present

## 2017-08-31 DIAGNOSIS — N186 End stage renal disease: Secondary | ICD-10-CM | POA: Diagnosis not present

## 2017-08-31 DIAGNOSIS — Z992 Dependence on renal dialysis: Secondary | ICD-10-CM | POA: Diagnosis not present

## 2017-09-01 ENCOUNTER — Emergency Department: Payer: Medicare HMO

## 2017-09-01 ENCOUNTER — Other Ambulatory Visit: Payer: Self-pay

## 2017-09-01 ENCOUNTER — Encounter: Payer: Self-pay | Admitting: Emergency Medicine

## 2017-09-01 ENCOUNTER — Inpatient Hospital Stay
Admission: EM | Admit: 2017-09-01 | Discharge: 2017-09-03 | DRG: 070 | Disposition: A | Discharge status: Home / Self Care | Payer: Medicare HMO | Attending: Internal Medicine | Admitting: Internal Medicine

## 2017-09-01 DIAGNOSIS — J9811 Atelectasis: Secondary | ICD-10-CM | POA: Diagnosis not present

## 2017-09-01 DIAGNOSIS — Z888 Allergy status to other drugs, medicaments and biological substances status: Secondary | ICD-10-CM

## 2017-09-01 DIAGNOSIS — I120 Hypertensive chronic kidney disease with stage 5 chronic kidney disease or end stage renal disease: Secondary | ICD-10-CM | POA: Diagnosis present

## 2017-09-01 DIAGNOSIS — F319 Bipolar disorder, unspecified: Secondary | ICD-10-CM | POA: Diagnosis not present

## 2017-09-01 DIAGNOSIS — N390 Urinary tract infection, site not specified: Secondary | ICD-10-CM | POA: Diagnosis present

## 2017-09-01 DIAGNOSIS — Z885 Allergy status to narcotic agent status: Secondary | ICD-10-CM

## 2017-09-01 DIAGNOSIS — F3110 Bipolar disorder, current episode manic without psychotic features, unspecified: Secondary | ICD-10-CM

## 2017-09-01 DIAGNOSIS — I248 Other forms of acute ischemic heart disease: Secondary | ICD-10-CM | POA: Diagnosis not present

## 2017-09-01 DIAGNOSIS — T6594XA Toxic effect of unspecified substance, undetermined, initial encounter: Secondary | ICD-10-CM | POA: Diagnosis present

## 2017-09-01 DIAGNOSIS — G9340 Encephalopathy, unspecified: Secondary | ICD-10-CM | POA: Diagnosis not present

## 2017-09-01 DIAGNOSIS — E876 Hypokalemia: Secondary | ICD-10-CM | POA: Diagnosis present

## 2017-09-01 DIAGNOSIS — R748 Abnormal levels of other serum enzymes: Secondary | ICD-10-CM | POA: Diagnosis not present

## 2017-09-01 DIAGNOSIS — N186 End stage renal disease: Secondary | ICD-10-CM | POA: Diagnosis not present

## 2017-09-01 DIAGNOSIS — Z66 Do not resuscitate: Secondary | ICD-10-CM | POA: Diagnosis not present

## 2017-09-01 DIAGNOSIS — Z91048 Other nonmedicinal substance allergy status: Secondary | ICD-10-CM | POA: Diagnosis not present

## 2017-09-01 DIAGNOSIS — K219 Gastro-esophageal reflux disease without esophagitis: Secondary | ICD-10-CM | POA: Diagnosis present

## 2017-09-01 DIAGNOSIS — R4020 Unspecified coma: Secondary | ICD-10-CM | POA: Diagnosis not present

## 2017-09-01 DIAGNOSIS — Z79899 Other long term (current) drug therapy: Secondary | ICD-10-CM

## 2017-09-01 DIAGNOSIS — G43909 Migraine, unspecified, not intractable, without status migrainosus: Secondary | ICD-10-CM | POA: Diagnosis present

## 2017-09-01 DIAGNOSIS — R778 Other specified abnormalities of plasma proteins: Secondary | ICD-10-CM

## 2017-09-01 DIAGNOSIS — I953 Hypotension of hemodialysis: Secondary | ICD-10-CM | POA: Diagnosis not present

## 2017-09-01 DIAGNOSIS — X58XXXA Exposure to other specified factors, initial encounter: Secondary | ICD-10-CM | POA: Diagnosis present

## 2017-09-01 DIAGNOSIS — Q613 Polycystic kidney, unspecified: Secondary | ICD-10-CM | POA: Diagnosis not present

## 2017-09-01 DIAGNOSIS — Z72820 Sleep deprivation: Secondary | ICD-10-CM

## 2017-09-01 DIAGNOSIS — R410 Disorientation, unspecified: Secondary | ICD-10-CM

## 2017-09-01 DIAGNOSIS — Z992 Dependence on renal dialysis: Secondary | ICD-10-CM | POA: Diagnosis not present

## 2017-09-01 DIAGNOSIS — Z9115 Patient's noncompliance with renal dialysis: Secondary | ICD-10-CM

## 2017-09-01 DIAGNOSIS — R4182 Altered mental status, unspecified: Secondary | ICD-10-CM | POA: Diagnosis not present

## 2017-09-01 DIAGNOSIS — Z915 Personal history of self-harm: Secondary | ICD-10-CM | POA: Diagnosis not present

## 2017-09-01 DIAGNOSIS — N2581 Secondary hyperparathyroidism of renal origin: Secondary | ICD-10-CM | POA: Diagnosis not present

## 2017-09-01 DIAGNOSIS — R2681 Unsteadiness on feet: Secondary | ICD-10-CM | POA: Diagnosis present

## 2017-09-01 DIAGNOSIS — D631 Anemia in chronic kidney disease: Secondary | ICD-10-CM | POA: Diagnosis not present

## 2017-09-01 DIAGNOSIS — Z733 Stress, not elsewhere classified: Secondary | ICD-10-CM | POA: Diagnosis not present

## 2017-09-01 DIAGNOSIS — R7989 Other specified abnormal findings of blood chemistry: Secondary | ICD-10-CM

## 2017-09-01 LAB — CBC WITH DIFFERENTIAL/PLATELET
BASOS ABS: 0.1 10*3/uL (ref 0–0.1)
BASOS PCT: 1 %
Eosinophils Absolute: 0 10*3/uL (ref 0–0.7)
Eosinophils Relative: 0 %
HEMATOCRIT: 38.1 % (ref 35.0–47.0)
HEMOGLOBIN: 12.9 g/dL (ref 12.0–16.0)
LYMPHS PCT: 10 %
Lymphs Abs: 1 10*3/uL (ref 1.0–3.6)
MCH: 31.6 pg (ref 26.0–34.0)
MCHC: 33.9 g/dL (ref 32.0–36.0)
MCV: 93.3 fL (ref 80.0–100.0)
MONO ABS: 0.6 10*3/uL (ref 0.2–0.9)
Monocytes Relative: 6 %
NEUTROS ABS: 8.1 10*3/uL — AB (ref 1.4–6.5)
NEUTROS PCT: 83 %
Platelets: 357 10*3/uL (ref 150–440)
RBC: 4.08 MIL/uL (ref 3.80–5.20)
RDW: 14.5 % (ref 11.5–14.5)
WBC: 9.7 10*3/uL (ref 3.6–11.0)

## 2017-09-01 LAB — ACETAMINOPHEN LEVEL: Acetaminophen (Tylenol), Serum: <10 ug/mL — ABNORMAL LOW (ref 10–30)

## 2017-09-01 LAB — ETHANOL

## 2017-09-01 LAB — COMPREHENSIVE METABOLIC PANEL
ALBUMIN: 4.6 g/dL (ref 3.5–5.0)
ALT: 13 U/L — AB (ref 14–54)
AST: 25 U/L (ref 15–41)
Alkaline Phosphatase: 57 U/L (ref 38–126)
Anion gap: 14 (ref 5–15)
BUN: 17 mg/dL (ref 6–20)
CO2: 27 mmol/L (ref 22–32)
CREATININE: 3.32 mg/dL — AB (ref 0.44–1.00)
Calcium: 10.1 mg/dL (ref 8.9–10.3)
Chloride: 96 mmol/L — ABNORMAL LOW (ref 101–111)
GFR calc Af Amer: 15 mL/min — ABNORMAL LOW (ref >60)
GFR, EST NON AFRICAN AMERICAN: 13 mL/min — AB (ref >60)
GLUCOSE: 112 mg/dL — AB (ref 65–99)
Potassium: 2.9 mmol/L — ABNORMAL LOW (ref 3.5–5.1)
Sodium: 137 mmol/L (ref 135–145)
TOTAL PROTEIN: 8 g/dL (ref 6.5–8.1)
Total Bilirubin: 0.8 mg/dL (ref 0.3–1.2)

## 2017-09-01 LAB — URINALYSIS, COMPLETE (UACMP) WITH MICROSCOPIC
BACTERIA UA: NONE SEEN
BILIRUBIN URINE: NEGATIVE
Glucose, UA: NEGATIVE mg/dL
Hgb urine dipstick: NEGATIVE
KETONES UR: NEGATIVE mg/dL
Nitrite: NEGATIVE
PH: 7 (ref 5.0–8.0)
PROTEIN: NEGATIVE mg/dL
Specific Gravity, Urine: 1.005 (ref 1.005–1.030)

## 2017-09-01 LAB — TSH: TSH: 3.406 u[IU]/mL (ref 0.350–4.500)

## 2017-09-01 LAB — URINE DRUG SCREEN, QUALITATIVE (ARMC ONLY)
Amphetamines, Ur Screen: NOT DETECTED
BARBITURATES, UR SCREEN: NOT DETECTED
Benzodiazepine, Ur Scrn: NOT DETECTED
CANNABINOID 50 NG, UR ~~LOC~~: NOT DETECTED
COCAINE METABOLITE, UR ~~LOC~~: NOT DETECTED
MDMA (ECSTASY) UR SCREEN: NOT DETECTED
METHADONE SCREEN, URINE: NOT DETECTED
OPIATE, UR SCREEN: NOT DETECTED
Phencyclidine (PCP) Ur S: NOT DETECTED
TRICYCLIC, UR SCREEN: NOT DETECTED

## 2017-09-01 LAB — LACTIC ACID, PLASMA
LACTIC ACID, VENOUS: 1.7 mmol/L (ref 0.5–1.9)
Lactic Acid, Venous: 2.1 mmol/L (ref 0.5–1.9)

## 2017-09-01 LAB — HEPATIC FUNCTION PANEL
ALBUMIN: 3.8 g/dL (ref 3.5–5.0)
ALT: 11 U/L — ABNORMAL LOW (ref 14–54)
AST: 23 U/L (ref 15–41)
Alkaline Phosphatase: 48 U/L (ref 38–126)
Bilirubin, Direct: <0.1 mg/dL — ABNORMAL LOW (ref 0.1–0.5)
Total Bilirubin: 0.6 mg/dL (ref 0.3–1.2)
Total Protein: 6.7 g/dL (ref 6.5–8.1)

## 2017-09-01 LAB — TROPONIN I: Troponin I: 0.04 ng/mL (ref <0.03)

## 2017-09-01 LAB — CARBAMAZEPINE LEVEL, TOTAL: CARBAMAZEPINE LVL: 7.7 ug/mL (ref 4.0–12.0)

## 2017-09-01 LAB — SALICYLATE LEVEL: Salicylate Lvl: <7 mg/dL (ref 2.8–30.0)

## 2017-09-01 MED ORDER — MIDODRINE HCL 5 MG PO TABS
10.0000 mg | ORAL_TABLET | Freq: Every day | ORAL | Status: DC
  Administered 2017-09-02 – 2017-09-03 (×2): 10 mg via ORAL
  Filled 2017-09-01 (×2): qty 2

## 2017-09-01 MED ORDER — DICYCLOMINE HCL 20 MG PO TABS
20.0000 mg | ORAL_TABLET | Freq: Three times a day (TID) | ORAL | Status: DC
  Administered 2017-09-02 – 2017-09-03 (×3): 20 mg via ORAL
  Filled 2017-09-01 (×6): qty 1

## 2017-09-01 MED ORDER — PANTOPRAZOLE SODIUM 40 MG PO TBEC
40.0000 mg | DELAYED_RELEASE_TABLET | Freq: Every day | ORAL | Status: DC
  Administered 2017-09-02 – 2017-09-03 (×2): 40 mg via ORAL
  Filled 2017-09-01 (×2): qty 1

## 2017-09-01 MED ORDER — ACETAMINOPHEN 325 MG PO TABS: 325.0000 mg | ORAL_TABLET | Freq: Two times a day (BID) | ORAL | Status: DC | PRN

## 2017-09-01 MED ORDER — BISACODYL 5 MG PO TBEC: 5.0000 mg | DELAYED_RELEASE_TABLET | Freq: Every day | ORAL | Status: DC | PRN

## 2017-09-01 MED ORDER — HALOPERIDOL 5 MG PO TABS
10.0000 mg | ORAL_TABLET | Freq: Once | ORAL | Status: AC
  Administered 2017-09-01: 10 mg via ORAL
  Filled 2017-09-01: qty 2

## 2017-09-01 MED ORDER — SODIUM CHLORIDE 0.9 % IV SOLN
1.0000 g | INTRAVENOUS | Status: DC
  Administered 2017-09-02: 1 g via INTRAVENOUS
  Filled 2017-09-01 (×2): qty 10

## 2017-09-01 MED ORDER — SODIUM CHLORIDE 0.9% FLUSH
3.0000 mL | Freq: Two times a day (BID) | INTRAVENOUS | Status: DC
  Administered 2017-09-01 – 2017-09-02 (×3): 3 mL via INTRAVENOUS

## 2017-09-01 MED ORDER — CYANOCOBALAMIN 1000 MCG/ML IJ SOLN
1000.0000 ug | INTRAMUSCULAR | Status: DC
  Filled 2017-09-01: qty 1

## 2017-09-01 MED ORDER — ACETAMINOPHEN 650 MG RE SUPP: 650.0000 mg | Freq: Four times a day (QID) | RECTAL | Status: DC | PRN

## 2017-09-01 MED ORDER — ONDANSETRON HCL 4 MG/2ML IJ SOLN
4.0000 mg | Freq: Four times a day (QID) | INTRAMUSCULAR | Status: DC | PRN
Start: 2017-09-01 — End: 2017-09-03

## 2017-09-01 MED ORDER — POTASSIUM CHLORIDE CRYS ER 20 MEQ PO TBCR
20.0000 meq | EXTENDED_RELEASE_TABLET | Freq: Once | ORAL | Status: AC
  Administered 2017-09-01: 20 meq via ORAL
  Filled 2017-09-01: qty 1

## 2017-09-01 MED ORDER — SODIUM CHLORIDE 0.9 % IV BOLUS (SEPSIS)
1000.0000 mL | Freq: Once | INTRAVENOUS | Status: AC
  Administered 2017-09-01: 1000 mL via INTRAVENOUS

## 2017-09-01 MED ORDER — SODIUM CHLORIDE 0.9 % IV SOLN
1.0000 g | Freq: Once | INTRAVENOUS | Status: AC
  Administered 2017-09-01: 1 g via INTRAVENOUS
  Filled 2017-09-01: qty 10

## 2017-09-01 MED ORDER — HEPARIN SODIUM (PORCINE) 5000 UNIT/ML IJ SOLN
5000.0000 [IU] | Freq: Three times a day (TID) | INTRAMUSCULAR | Status: DC
  Administered 2017-09-01 – 2017-09-03 (×5): 5000 [IU] via SUBCUTANEOUS
  Filled 2017-09-01 (×5): qty 1

## 2017-09-01 MED ORDER — VITAMIN B-12 1000 MCG PO TABS
1000.0000 ug | ORAL_TABLET | Freq: Every day | ORAL | Status: DC
  Administered 2017-09-02 – 2017-09-03 (×2): 1000 ug via ORAL
  Filled 2017-09-01 (×2): qty 1

## 2017-09-01 MED ORDER — SODIUM CHLORIDE 0.9% FLUSH: 3.0000 mL | INTRAVENOUS | Status: DC | PRN

## 2017-09-01 MED ORDER — TIMOLOL MALEATE 0.5 % OP SOLN
1.0000 [drp] | Freq: Two times a day (BID) | OPHTHALMIC | Status: DC
  Administered 2017-09-01 – 2017-09-02 (×3): 1 [drp] via OPHTHALMIC
  Filled 2017-09-01: qty 5

## 2017-09-01 MED ORDER — LORAZEPAM 2 MG PO TABS
2.0000 mg | ORAL_TABLET | Freq: Once | ORAL | Status: AC
  Administered 2017-09-01: 2 mg via ORAL
  Filled 2017-09-01: qty 1

## 2017-09-01 MED ORDER — ACETAMINOPHEN 325 MG PO TABS: 650.0000 mg | ORAL_TABLET | Freq: Four times a day (QID) | ORAL | Status: DC | PRN

## 2017-09-01 MED ORDER — SODIUM CHLORIDE 0.9 % IV SOLN: 250.0000 mL | INTRAVENOUS | Status: DC | PRN

## 2017-09-01 MED ORDER — ONDANSETRON HCL 4 MG PO TABS: 4.0000 mg | ORAL_TABLET | Freq: Four times a day (QID) | ORAL | Status: DC | PRN

## 2017-09-01 MED ORDER — VITAMIN D 1000 UNITS PO TABS
1000.0000 [IU] | ORAL_TABLET | Freq: Every day | ORAL | Status: DC
  Administered 2017-09-02 – 2017-09-03 (×2): 1000 [IU] via ORAL
  Filled 2017-09-01 (×2): qty 1

## 2017-09-01 MED ORDER — BRIMONIDINE TARTRATE 0.2 % OP SOLN
1.0000 [drp] | Freq: Two times a day (BID) | OPHTHALMIC | Status: DC
  Administered 2017-09-01 – 2017-09-03 (×4): 1 [drp] via OPHTHALMIC
  Filled 2017-09-01: qty 5

## 2017-09-01 MED ORDER — CARBAMAZEPINE 200 MG PO TABS
200.0000 mg | ORAL_TABLET | Freq: Two times a day (BID) | ORAL | Status: DC
  Administered 2017-09-01 – 2017-09-03 (×4): 200 mg via ORAL
  Filled 2017-09-01 (×5): qty 1

## 2017-09-01 MED ORDER — PRAMIPEXOLE DIHYDROCHLORIDE 0.25 MG PO TABS
0.1250 mg | ORAL_TABLET | Freq: Every day | ORAL | Status: DC
  Administered 2017-09-01 – 2017-09-02 (×2): 0.125 mg via ORAL
  Filled 2017-09-01 (×2): qty 1

## 2017-09-01 NOTE — H&P (Signed)
[facility]  NAME: Joyce [name]    MR#:  [id number]  DATE OF BIRTH:  1945-11-18  DATE OF ADMISSION:  09/01/2017  PRIMARY CARE PHYSICIAN: Cletis Athens, MD   REQUESTING/REFERRING PHYSICIAN: Conni Slipper MD  CHIEF COMPLAINT:   Chief Complaint  Patient presents with  . Psychiatric Evaluation    HISTORY OF PRESENT ILLNESS: Joyce [name]  is a 72 y.o. female with a known history of end-stage renal disease on hemodialysis per vascular note from the office, essential hypertension, heart murmur, bipolar affective disorder who is presenting to the hospital with complaint of confusion.  Patient not able to give me any further details.  Evaluation in the emergency room shows that she may have mild UTI lactic acid is elevated as well as troponin is slightly abnormal.  CT scan of the head is normal.     PAST MEDICAL HISTORY:   Past Medical History:  Diagnosis Date  . Anal fissure   . Bipolar affective disorder ([place])   . CKD (chronic kidney disease)    Dr Holley Raring [name] 4  . Colon polyps   . Diverticulitis   . Diverticulitis   . Family history of adverse reaction to anesthesia    mom - PONV  . GERD (gastroesophageal reflux disease)   . Headache    migraines - none over 10 yrs  . Heart murmur   . History of hiatal hernia   . Hypertension   . Pancreatitis    Valproic acid  . Vertigo     PAST SURGICAL HISTORY:  Past Surgical History:  Procedure Laterality Date  . A/V FISTULAGRAM Left 04/14/2017   Procedure: A/V Fistulagram;  Surgeon: Katha Cabal, MD;  Location: [facility];  Service: Cardiovascular;  Laterality: Left;  . A/V FISTULAGRAM Left 06/09/2017   Procedure: A/V FISTULAGRAM;  Surgeon: Katha Cabal, MD;  Location: [facility];  Service: Cardiovascular;  Laterality: Left;  . ABDOMINAL HYSTERECTOMY  1990 ?  . AV FISTULA PLACEMENT  4/30  . BREAST EXCISIONAL BIOPSY Left 1994   neg surgical bx  .  CHOLECYSTECTOMY  2003  . COLONOSCOPY  2014   Dr. Jamal Collin  . COLONOSCOPY WITH PROPOFOL N/A 09/24/2015   Procedure: COLONOSCOPY WITH random colon byopies.;  Surgeon: Lucilla Lame, MD;  Location: [facility];  Service: Endoscopy;  Laterality: N/A;  . ESOPHAGOGASTRODUODENOSCOPY (EGD) WITH PROPOFOL N/A 09/24/2015   Procedure: ESOPHAGOGASTRODUODENOSCOPY (EGD) ;  Surgeon: Lucilla Lame, MD;  Location: [facility];  Service: Endoscopy;  Laterality: N/A;  . EYE SURGERY    . PERIPHERAL VASCULAR CATHETERIZATION N/A 05/29/2015   Procedure: A/V Shuntogram/Fistulagram;  Surgeon: Katha Cabal, MD;  Location: [facility];  Service: Cardiovascular;  Laterality: N/A;  . PERIPHERAL VASCULAR CATHETERIZATION N/A 05/29/2015   Procedure: A/V Shunt Intervention;  Surgeon: Katha Cabal, MD;  Location: [facility];  Service: Cardiovascular;  Laterality: N/A;  . POLYPECTOMY  09/24/2015   Procedure: POLYPECTOMY INTESTINAL;  Surgeon: Lucilla Lame, MD;  Location: [facility];  Service: Endoscopy;;  cecal polyp ascending polyp    SOCIAL HISTORY:  Social History   Tobacco Use  . Smoking status: Never Smoker  . Smokeless tobacco: Never Used  Substance Use Topics  . Alcohol use: No    Alcohol/week: 0.0 oz    FAMILY HISTORY:  Family History  Problem Relation Age of Onset  . Stroke Father   . Hypertension Father   .  Breast cancer Other   . Colon cancer Neg Hx   . Liver disease Neg Hx     DRUG ALLERGIES:  Allergies  Allergen Reactions  . Morphine And Related Shortness Of Breath    Pt reports chest pain and difficulty breathing.  . Indomethacin Hives  . Pollen Extract Other (See Comments)    Sinus problems and HA    REVIEW OF SYSTEMS:   CONSTITUTIONAL: Confused   MEDICATIONS AT HOME:  Prior to Admission medications   Medication Sig Start Date End Date Taking? Authorizing Provider  acetaminophen (TYLENOL) 325 MG tablet Take 325 mg 2 (two) times daily as needed  by mouth for moderate pain or headache.     [provider]  bisacodyl (DULCOLAX) 5 MG EC tablet Take 1 tablet (5 mg total) by mouth daily as needed for moderate constipation. Patient not taking: Reported on 04/09/2017 02/11/17   Vaughan Basta, MD  brimonidine (ALPHAGAN) 0.2 % ophthalmic solution Place 2 (two) times daily into both eyes.    [provider]  carbamazepine (TEGRETOL) 200 MG tablet Take 200 mg by mouth 2 (two) times daily. 02/13/17   [provider]  cholecalciferol (VITAMIN D) 1000 UNITS tablet Take 1,000 Units by mouth daily.    [provider]  cyanocobalamin (,VITAMIN B-12,) 1000 MCG/ML injection Inject 1,000 mcg into the muscle every 30 (thirty) days.    [provider]  dicyclomine (BENTYL) 20 MG tablet Take 1 tablet (20 mg total) by mouth 3 (three) times daily before meals. 07/08/17   Lucilla Lame, MD  latanoprost (XALATAN) 0.005 % ophthalmic solution Place 1 drop into both eyes at bedtime.     [provider]  lidocaine-prilocaine (EMLA) cream Apply 1 application every Monday, Wednesday, and Friday topically. At dialysis 05/06/17   [provider]  midodrine (PROAMATINE) 10 MG tablet  07/06/17   [provider]  oxyCODONE-acetaminophen (PERCOCET/ROXICET) 5-325 MG tablet Take 1 tablet by mouth every 6 (six) hours as needed for moderate pain or severe pain. Patient not taking: Reported on 03/11/2017 02/15/17   Henreitta Leber, MD  pantoprazole (PROTONIX) 40 MG tablet TAKE 1 TABLET EVERY DAY Patient not taking: Reported on 06/09/2017 10/08/16   Lucilla Lame, MD  pramipexole (MIRAPEX) 0.125 MG tablet Take 0.125 mg by mouth daily at 2 PM. 03/17/17   [provider]  QUEtiapine (SEROQUEL) 25 MG tablet Take 50 mg by mouth at bedtime.  02/13/17   [provider]  timolol (TIMOPTIC) 0.5 % ophthalmic solution Place 1 drop 2 (two) times daily into both eyes.     [provider]  traMADol  (ULTRAM) 50 MG tablet Take 1 tablet (50 mg total) by mouth every 6 (six) hours as needed for moderate pain or severe pain. Patient not taking: Reported on 03/11/2017 02/11/17   Vaughan Basta, MD  verapamil (CALAN-SR) 240 MG CR tablet Take 240 mg daily at 3 pm by mouth.     [provider]      PHYSICAL EXAMINATION:   VITAL SIGNS: Blood pressure (!) 169/91, pulse 98, temperature 98 F (36.7 C), temperature source Oral, resp. rate 20, height 5\' 3"  (1.6 m), weight 175 lb (79.4 kg), SpO2 98 %.  GENERAL:  72 y.o.-year-old patient lying in the bed with no acute distress.  EYES: Pupils equal, round, reactive to light and accommodation. No scleral icterus. Extraocular muscles intact.  HEENT: Head atraumatic, normocephalic. Oropharynx and nasopharynx clear.  NECK:  Supple, no jugular venous distention. No thyroid  enlargement, no tenderness.  LUNGS: Normal breath sounds bilaterally, no wheezing, rales,rhonchi or crepitation. No use of accessory muscles of respiration.  CARDIOVASCULAR: S1, S2 normal. No murmurs, rubs, or gallops.  ABDOMEN: Soft, nontender, nondistended. Bowel sounds present. No organomegaly or mass.  EXTREMITIES: No pedal edema, cyanosis, or clubbing.  NEUROLOGIC: Cranial nerves II through XII are intact. Muscle strength 5/5 in all extremities. Sensation intact. Gait not checked.  PSYCHIATRIC: Patient is confused but not agitated  sKIN: No obvious rash, lesion, or ulcer.   LABORATORY PANEL:   CBC Recent Labs  Lab 09/01/17 1632  WBC 9.7  HGB 12.9  HCT 38.1  PLT 357  MCV 93.3  MCH 31.6  MCHC 33.9  RDW 14.5  LYMPHSABS 1.0  MONOABS 0.6  EOSABS 0.0  BASOSABS 0.1   ------------------------------------------------------------------------------------------------------------------  Chemistries  Recent Labs  Lab 09/01/17 1632  NA 137  K 2.9*  CL 96*  CO2 27  GLUCOSE 112*  BUN 17  CREATININE 3.32*  CALCIUM 10.1  AST 25  ALT 13*  ALKPHOS 57   BILITOT 0.8   ------------------------------------------------------------------------------------------------------------------ estimated creatinine clearance is 15.7 mL/min (A) (by C-G formula based on SCr of 3.32 mg/dL (H)). ------------------------------------------------------------------------------------------------------------------ No results for input(s): TSH, T4TOTAL, T3FREE, THYROIDAB in the last 72 hours.  Invalid input(s): FREET3   Coagulation profile No results for input(s): INR, PROTIME in the last 168 hours. ------------------------------------------------------------------------------------------------------------------- No results for input(s): DDIMER in the last 72 hours. -------------------------------------------------------------------------------------------------------------------  Cardiac Enzymes Recent Labs  Lab 09/01/17 1632  TROPONINI 0.04*   ------------------------------------------------------------------------------------------------------------------ Invalid input(s): POCBNP  ---------------------------------------------------------------------------------------------------------------  Urinalysis    Component Value Date/Time   COLORURINE YELLOW (A) 09/01/2017 1641   APPEARANCEUR CLEAR (A) 09/01/2017 1641   APPEARANCEUR Clear 05/10/2014 1344   LABSPEC 1.005 09/01/2017 1641   LABSPEC 1.009 05/10/2014 1344   PHURINE 7.0 09/01/2017 1641   GLUCOSEU NEGATIVE 09/01/2017 1641   GLUCOSEU Negative 05/10/2014 1344   HGBUR NEGATIVE 09/01/2017 1641   BILIRUBINUR NEGATIVE 09/01/2017 1641   BILIRUBINUR Negative 05/10/2014 1344   KETONESUR NEGATIVE 09/01/2017 1641   PROTEINUR NEGATIVE 09/01/2017 1641   NITRITE NEGATIVE 09/01/2017 1641   LEUKOCYTESUR SMALL (A) 09/01/2017 1641   LEUKOCYTESUR Negative 05/10/2014 1344     RADIOLOGY: Ct Head Wo Contrast  Result Date: 09/01/2017 CLINICAL DATA:  Altered mental status EXAM: CT HEAD WITHOUT CONTRAST  TECHNIQUE: Contiguous axial images were obtained from the base of the skull through the vertex without intravenous contrast. COMPARISON:  Head CT 12/17/2013 FINDINGS: Brain: No mass lesion, intraparenchymal hemorrhage or extra-axial collection. No evidence of acute cortical infarct. Brain parenchyma and CSF-containing spaces are normal for age. Vascular: No hyperdense vessel or unexpected calcification. Skull: Normal visualized skull base, calvarium and extracranial soft tissues. Sinuses/Orbits: No sinus fluid levels or advanced mucosal thickening. No mastoid effusion. Normal orbits. IMPRESSION: Normal aging brain. Electronically Signed   By: Ulyses Jarred M.D.   On: 09/01/2017 17:35   Dg Chest Portable 1 View  Result Date: 09/01/2017 CLINICAL DATA:  Abnormal behavior. EXAM: PORTABLE CHEST 1 VIEW COMPARISON:  08/17/2017. FINDINGS: Limited exam due to patient being uncooperative. Low lung volumes with mild bibasilar atelectasis/infiltrates. No pleural effusion or pneumothorax. Heart size stable. No acute bony abnormality. IMPRESSION: Limited exam due to the patient being uncooperative. Low lung volumes with mild bibasilar atelectasis/infiltrates. Electronically Signed   By: Marcello Moores  Register   On: 09/01/2017 17:17    EKG: Orders placed or performed during the hospital encounter of 09/01/17  . ED EKG  . ED  EKG  . ED EKG  . ED EKG  . EKG 12-Lead  . EKG 12-Lead    IMPRESSION AND PLAN: Patient is a 72 year old white female who is presenting with confusion  1.  Acute encephalopathy etiology unclear I will hold her pain medications Check Tegretol level Neurology consult Patient's kept moving during her x-ray so I do not think she will be able to have an MRI but if more calm tomorrow can have an MRI Check LFTs and ammonia level  2.  Questionable end-stage renal disease: Nephrology consult  3.  Mild UTI treat with ceftriaxone  4.  History of bipolar disorder psych eval for hold Seroquel for  now  5.  Elevated troponin likely due to demand ischemia follow troponin levels allergy to indomethacin will hold aspirin for now  6.  GERD we will treat with Protonix  7.  Miscellaneous heparin for DVT prophylaxis    All the records are reviewed and case discussed with ED provider. Management plans discussed with the patient, family and they are in agreement.  CODE STATUS: Code Status History    Date Active Date Inactive Code Status Order ID Comments User Context   02/12/2017 18:16 02/15/2017 15:12 Full Code [id number]  Baxter Hire, MD Inpatient   02/09/2017 14:25 02/11/2017 19:17 Partial Code [id number]  Bettey Costa, MD Inpatient   02/09/2017 13:33 02/09/2017 14:25 Full Code [id number]  Bettey Costa, MD Inpatient   12/18/2016 18:43 12/20/2016 14:50 Full Code [id number]  Hillary Bow, MD ED   10/10/2016 19:34 10/12/2016 17:48 Full Code [id number]  Vaughan Basta, MD Inpatient   10/03/2016 11:43 10/06/2016 18:06 Full Code [id number]  Gladstone Lighter, MD Inpatient   07/28/2016 14:47 07/31/2016 16:04 DNR [id number]  Bettey Costa, MD ED    Advance Directive Documentation     Most Recent Value  Type of Advance Directive  Healthcare Power of Attorney  Pre-existing out of facility DNR order (yellow form or pink MOST form)  No data  "MOST" Form in Place?  No data       TOTAL TIME TAKING CARE OF THIS PATIENT:55  minutes.    Dustin Flock M.D on 09/01/2017 at 7:32 PM  Between 7am to 6pm - Pager - [phone]  After 6pm go to [email or web address] - password [email or web address]  Office  [phone]  CC: Primary care physician; Cletis Athens, MD

## 2017-09-01 NOTE — ED Notes (Signed)
Pt  Vol

## 2017-09-01 NOTE — ED Notes (Signed)
All of pt belongings handed off to pt's sister at this time:  [place]

## 2017-09-01 NOTE — ED Provider Notes (Signed)
[facility] Emergency Department Provider Note   ____________________________________________   First MD Initiated Contact with Patient 09/01/17 1638     (approximate)  I have reviewed the triage vital signs and the nursing notes.   HISTORY  Chief Complaint Psychiatric Evaluation  history is somewhat compromised by patient's behavior  HPI Joyce [name] is a 72 y.o. female Patient brought in by sister for "bizarre behavior" getting worse for a week. Patient says she got off her medicine. When I see her she is trying to walk out of the room and saying not now I do not now have to go do this and other vague statements that do not make a lot of sense. She is trying to pull away from the nurse and walked toward me and that she reverses and tries to pull away from me and walked toward the nurse acting somewhat paranoid doesn't want me to be close to her list to her heart and lungs. Seems little unsteady on her feet as well. Denies any chest pain is not coughing . No apparent fever recently.   Past Medical History:  Diagnosis Date  . Anal fissure   . Bipolar affective disorder ([place])   . CKD (chronic kidney disease)    Dr Holley Raring [name] 4  . Colon polyps   . Diverticulitis   . Diverticulitis   . Family history of adverse reaction to anesthesia    mom - PONV  . GERD (gastroesophageal reflux disease)   . Headache    migraines - none over 10 yrs  . Heart murmur   . History of hiatal hernia   . Hypertension   . Pancreatitis    Valproic acid  . Vertigo     Patient Active Problem List   Diagnosis Date Noted  . ESRD on dialysis ([place]) 04/06/2017  . Complication of vascular access for dialysis 04/06/2017  . Hematuria 12/18/2016  . Polycystic kidney 10/12/2016  . Ruptured cyst of kidney 10/03/2016  . HTN (hypertension) 10/01/2016  . Chronic kidney disease 10/01/2016  . Anemia associated with chronic renal failure 09/24/2016  . Acute kidney  insufficiency 07/28/2016  . Noninfectious diarrhea   . Benign neoplasm of cecum   . Benign neoplasm of ascending colon   . Diarrhea   . Nausea   . Gastric polyp   . Chronic constipation 12/27/2014  . Nausea with vomiting 12/27/2014  . Left sided abdominal pain 12/27/2014  . Personal history of colonic polyps 05/10/2013    Past Surgical History:  Procedure Laterality Date  . A/V FISTULAGRAM Left 04/14/2017   Procedure: A/V Fistulagram;  Surgeon: Katha Cabal, MD;  Location: [place] [facility];  Service: Cardiovascular;  Laterality: Left;  . A/V FISTULAGRAM Left 06/09/2017   Procedure: A/V FISTULAGRAM;  Surgeon: Katha Cabal, MD;  Location: [place] [facility];  Service: Cardiovascular;  Laterality: Left;  . ABDOMINAL HYSTERECTOMY  1990 ?  . AV FISTULA PLACEMENT  4/30  . BREAST EXCISIONAL BIOPSY Left 1994   neg surgical bx  . CHOLECYSTECTOMY  2003  . COLONOSCOPY  2014   Dr. Jamal Collin  . COLONOSCOPY WITH PROPOFOL N/A 09/24/2015   Procedure: COLONOSCOPY WITH random colon byopies.;  Surgeon: Lucilla Lame, MD;  Location: [place];  Service: Endoscopy;  Laterality: N/A;  . ESOPHAGOGASTRODUODENOSCOPY (EGD) WITH PROPOFOL N/A 09/24/2015   Procedure: ESOPHAGOGASTRODUODENOSCOPY (EGD) ;  Surgeon: Lucilla Lame, MD;  Location: [place];  Service: Endoscopy;  Laterality: N/A;  . EYE SURGERY    .  PERIPHERAL VASCULAR CATHETERIZATION N/A 05/29/2015   Procedure: A/V Shuntogram/Fistulagram;  Surgeon: Katha Cabal, MD;  Location: [place] [facility];  Service: Cardiovascular;  Laterality: N/A;  . PERIPHERAL VASCULAR CATHETERIZATION N/A 05/29/2015   Procedure: A/V Shunt Intervention;  Surgeon: Katha Cabal, MD;  Location: [place] [facility];  Service: Cardiovascular;  Laterality: N/A;  . POLYPECTOMY  09/24/2015   Procedure: POLYPECTOMY INTESTINAL;  Surgeon: Lucilla Lame, MD;  Location: [place];  Service: Endoscopy;;  cecal polyp ascending polyp    Prior  to Admission medications   Medication Sig Start Date End Date Taking? Authorizing Provider  acetaminophen (TYLENOL) 325 MG tablet Take 325 mg 2 (two) times daily as needed by mouth for moderate pain or headache.     [provider]  bisacodyl (DULCOLAX) 5 MG EC tablet Take 1 tablet (5 mg total) by mouth daily as needed for moderate constipation. Patient not taking: Reported on 04/09/2017 02/11/17   Vaughan Basta, MD  brimonidine (ALPHAGAN) 0.2 % ophthalmic solution Place 2 (two) times daily into both eyes.    [provider]  carbamazepine (TEGRETOL) 200 MG tablet Take 200 mg by mouth 2 (two) times daily. 02/13/17   [provider]  cholecalciferol (VITAMIN D) 1000 UNITS tablet Take 1,000 Units by mouth daily.    [provider]  cyanocobalamin (,VITAMIN B-12,) 1000 MCG/ML injection Inject 1,000 mcg into the muscle every 30 (thirty) days.    [provider]  dicyclomine (BENTYL) 20 MG tablet Take 1 tablet (20 mg total) by mouth 3 (three) times daily before meals. 07/08/17   Lucilla Lame, MD  latanoprost (XALATAN) 0.005 % ophthalmic solution Place 1 drop into both eyes at bedtime.     [provider]  lidocaine-prilocaine (EMLA) cream Apply 1 application every Monday, Wednesday, and Friday topically. At dialysis 05/06/17   [provider]  midodrine (PROAMATINE) 10 MG tablet  07/06/17   [provider]  oxyCODONE-acetaminophen (PERCOCET/ROXICET) 5-325 MG tablet Take 1 tablet by mouth every 6 (six) hours as needed for moderate pain or severe pain. Patient not taking: Reported on 03/11/2017 02/15/17   Henreitta Leber, MD  pantoprazole (PROTONIX) 40 MG tablet TAKE 1 TABLET EVERY DAY Patient not taking: Reported on 06/09/2017 10/08/16   Lucilla Lame, MD  pramipexole (MIRAPEX) 0.125 MG tablet Take 0.125 mg by mouth daily at 2 PM. 03/17/17   [provider]  QUEtiapine (SEROQUEL) 25 MG tablet Take 50 mg by mouth at  bedtime.  02/13/17   [provider]  timolol (TIMOPTIC) 0.5 % ophthalmic solution Place 1 drop 2 (two) times daily into both eyes.     [provider]  traMADol (ULTRAM) 50 MG tablet Take 1 tablet (50 mg total) by mouth every 6 (six) hours as needed for moderate pain or severe pain. Patient not taking: Reported on 03/11/2017 02/11/17   Vaughan Basta, MD  verapamil (CALAN-SR) 240 MG CR tablet Take 240 mg daily at 3 pm by mouth.     [provider]    Allergies Morphine and related; Indomethacin; and Pollen extract  Family History  Problem Relation Age of Onset  . Stroke Father   . Hypertension Father   . Breast cancer Other   . Colon cancer Neg Hx   . Liver disease Neg Hx     Social History Social History   Tobacco Use  . Smoking status: Never Smoker  . Smokeless tobacco: Never Used  Substance Use Topics  . Alcohol  use: No    Alcohol/week: 0.0 oz  . Drug use: No    Review of Systems  Constitutional: No fever/chills Eyes: No visual changes. ENT: No sore throat. Cardiovascular: Denies chest pain. Respiratory: Denies shortness of breath. Gastrointestinal: No abdominal pain.  No nausea, no vomiting.  No diarrhea.  No constipation. Genitourinary: Negative for dysuria. Musculoskeletal: Negative for back pain. Skin: Negative for rash. Neurological: Negative for headaches, focal weakness   ____________________________________________   PHYSICAL EXAM:  VITAL SIGNS: ED Triage Vitals  Enc Vitals Group     BP 09/01/17 1626 (!) 169/91     Pulse Rate 09/01/17 1626 98     Resp 09/01/17 1626 20     Temp 09/01/17 1626 98 F (36.7 C)     Temp Source 09/01/17 1626 Oral     SpO2 09/01/17 1626 98 %     Weight 09/01/17 1627 175 lb (79.4 kg)     Height 09/01/17 1627 5\' 3"  (1.6 m)     Head Circumference --      Peak Flow --      Pain Score 09/01/17 1626 0     Pain Loc --      Pain Edu? --      Excl. in [place]? --     Constitutional: Alert   Well appearing and in no acute distress. Eyes: Conjunctivae are normal.  Head: Atraumatic. Nose: No congestion/rhinnorhea. Mouth/Throat: Mucous membranes are moist.  Oropharynx non-erythematous. Neck: No stridor.   Cardiovascular: Normal rate, regular rhythm. Grossly normal heart sounds.  Good peripheral circulation. Respiratory: Normal respiratory effort.  No retractions. Lungs CTAB.difficult exam as patient Walking around the Room on Trying to Check on Her Gastrointestinal: Soft and nontender. No distention. No abdominal bruits. No CVA tenderness. Musculoskeletal: No lower extremity tenderness nor edema.  No joint effusions. Neurologic:  Normal speech and language. No gross focal neurologic deficits are appreciated.patient's gait is a little bit unsteady Skin:  Skin is warm, dry and intact. No rash noted.   ____________________________________________   LABS (all labs ordered are listed, but only abnormal results are displayed)  Labs Reviewed  COMPREHENSIVE METABOLIC PANEL - Abnormal; Notable for the following components:      Result Value   Potassium 2.9 (*)    Chloride 96 (*)    Glucose, Bld 112 (*)    Creatinine, Ser 3.32 (*)    ALT 13 (*)    GFR calc non Af Amer 13 (*)    GFR calc Af Amer 15 (*)    All other components within normal limits  ACETAMINOPHEN LEVEL - Abnormal; Notable for the following components:   Acetaminophen (Tylenol), Serum <10 (*)    All other components within normal limits  TROPONIN I - Abnormal; Notable for the following components:   Troponin I 0.04 (*)    All other components within normal limits  LACTIC ACID, PLASMA - Abnormal; Notable for the following components:   Lactic Acid, Venous 2.1 (*)    All other components within normal limits  CBC WITH DIFFERENTIAL/PLATELET - Abnormal; Notable for the following components:   Neutro Abs 8.1 (*)    All other components within normal limits  URINALYSIS, COMPLETE (UACMP) WITH MICROSCOPIC - Abnormal;  Notable for the following components:   Color, Urine YELLOW (*)    APPearance CLEAR (*)    Leukocytes, UA SMALL (*)    Squamous Epithelial / LPF 0-5 (*)    All other components within normal limits  URINE CULTURE  CULTURE,  BLOOD (ROUTINE X 2)  CULTURE, BLOOD (ROUTINE X 2)  ETHANOL  SALICYLATE LEVEL  URINE DRUG SCREEN, QUALITATIVE ([facility] ONLY)  LACTIC ACID, PLASMA   ____________________________________________  EKG  EKG read and interpreted by me shows normal sinus rhythm rate of 96 left axis no acute ST-T wave changes ____________________________________________  RADIOLOGY  ED MD interpretation:  CT of the head shows no acute pathology chest x-ray is very poor inspiratory view difficult to tell anything.  Official radiology report(s): Ct Head Wo Contrast  Result Date: 09/01/2017 CLINICAL DATA:  Altered mental status EXAM: CT HEAD WITHOUT CONTRAST TECHNIQUE: Contiguous axial images were obtained from the base of the skull through the vertex without intravenous contrast. COMPARISON:  Head CT 12/17/2013 FINDINGS: Brain: No mass lesion, intraparenchymal hemorrhage or extra-axial collection. No evidence of acute cortical infarct. Brain parenchyma and CSF-containing spaces are normal for age. Vascular: No hyperdense vessel or unexpected calcification. Skull: Normal visualized skull base, calvarium and extracranial soft tissues. Sinuses/Orbits: No sinus fluid levels or advanced mucosal thickening. No mastoid effusion. Normal orbits. IMPRESSION: Normal aging brain. Electronically Signed   By: Ulyses Jarred M.D.   On: 09/01/2017 17:35   Dg Chest Portable 1 View  Result Date: 09/01/2017 CLINICAL DATA:  Abnormal behavior. EXAM: PORTABLE CHEST 1 VIEW COMPARISON:  08/17/2017. FINDINGS: Limited exam due to patient being uncooperative. Low lung volumes with mild bibasilar atelectasis/infiltrates. No pleural effusion or pneumothorax. Heart size stable. No acute bony abnormality. IMPRESSION: Limited  exam due to the patient being uncooperative. Low lung volumes with mild bibasilar atelectasis/infiltrates. Electronically Signed   By: Marcello Moores  Register   On: 09/01/2017 17:17    ____________________________________________   PROCEDURES  Procedure(s) performed:   Procedures  Critical Care performed:   ____________________________________________   INITIAL IMPRESSION / ASSESSMENT AND PLAN / ED COURSE  patient has a UTI she may or may not have pneumonia. Her lactic acid is just returned back is slightly elevated. White blood count is not elevated but her differential shows a lot of polys. I will give her some Rocephin which should cover the UTI and most things that would cause a pneumonia although she is not coughing and is not running a fever either side doubt there is a pneumonia. She has come down slightly with the Haldol and Ativan. We'll do a medical admission and have psychiatry consult.         ____________________________________________   FINAL CLINICAL IMPRESSION(S) / ED DIAGNOSES  Final diagnoses:  Altered mental status, unspecified altered mental status type  Urinary tract infection without hematuria, site unspecified  Elevated troponin     ED Discharge Orders    None       Note:  This document was prepared using Dragon voice recognition software and may include unintentional dictation errors.    Nena Polio, MD 09/01/17 [phone]

## 2017-09-01 NOTE — ED Notes (Signed)
Dian Situ, RN 1C states cannot take pt d/t dialysis. ED charge aware, contacting bed placement

## 2017-09-01 NOTE — ED Triage Notes (Addendum)
Pt in via POV from home; brought in by sister for bizarre behavior, worsening x one week.  Family is vague in description of behavior.  Pt will not answer my questions unless prompted by sister, looks over to sister and then back at myself.  Sister reports hx of Bipolar.  Pt states, "I got off of em" when asked if she has been taking her medications.  Pt denies SI/HI.  NAD noted at this time.

## 2017-09-01 NOTE — ED Notes (Signed)

## 2017-09-01 NOTE — ED Notes (Signed)
1C charge RN states not taking pt at this time, unable to provide this RN with info regarding why not taking pt and states will let me know after discussion with bed placement.

## 2017-09-02 ENCOUNTER — Inpatient Hospital Stay (HOSPITAL_COMMUNITY)
Admit: 2017-09-02 | Discharge: 2017-09-02 | Disposition: A | Discharge status: Home / Self Care | Payer: Medicare HMO | Attending: Physician Assistant | Admitting: Physician Assistant

## 2017-09-02 ENCOUNTER — Inpatient Hospital Stay: Payer: Medicare HMO

## 2017-09-02 DIAGNOSIS — R4182 Altered mental status, unspecified: Secondary | ICD-10-CM

## 2017-09-02 DIAGNOSIS — I120 Hypertensive chronic kidney disease with stage 5 chronic kidney disease or end stage renal disease: Secondary | ICD-10-CM

## 2017-09-02 DIAGNOSIS — F319 Bipolar disorder, unspecified: Secondary | ICD-10-CM

## 2017-09-02 DIAGNOSIS — F3110 Bipolar disorder, current episode manic without psychotic features, unspecified: Secondary | ICD-10-CM

## 2017-09-02 DIAGNOSIS — R410 Disorientation, unspecified: Secondary | ICD-10-CM

## 2017-09-02 LAB — BASIC METABOLIC PANEL
Anion gap: 13 (ref 5–15)
BUN: 17 mg/dL (ref 6–20)
CO2: 22 mmol/L (ref 22–32)
CREATININE: 3.47 mg/dL — AB (ref 0.44–1.00)
Calcium: 9 mg/dL (ref 8.9–10.3)
Chloride: 107 mmol/L (ref 101–111)
GFR, EST AFRICAN AMERICAN: 14 mL/min — AB (ref >60)
GFR, EST NON AFRICAN AMERICAN: 12 mL/min — AB (ref >60)
GLUCOSE: 83 mg/dL (ref 65–99)
Potassium: 3.1 mmol/L — ABNORMAL LOW (ref 3.5–5.1)
Sodium: 142 mmol/L (ref 135–145)

## 2017-09-02 LAB — CBC
HEMATOCRIT: 35 % (ref 35.0–47.0)
Hemoglobin: 11.5 g/dL — ABNORMAL LOW (ref 12.0–16.0)
MCH: 31.9 pg (ref 26.0–34.0)
MCHC: 33 g/dL (ref 32.0–36.0)
MCV: 96.5 fL (ref 80.0–100.0)
PLATELETS: 281 10*3/uL (ref 150–440)
RBC: 3.62 MIL/uL — ABNORMAL LOW (ref 3.80–5.20)
RDW: 14.8 % — ABNORMAL HIGH (ref 11.5–14.5)
WBC: 6.3 10*3/uL (ref 3.6–11.0)

## 2017-09-02 LAB — ECHOCARDIOGRAM COMPLETE
Height: 63 in
Weight: 3068.8 oz

## 2017-09-02 LAB — TROPONIN I: TROPONIN I: 0.07 ng/mL — AB (ref <0.03)

## 2017-09-02 LAB — MAGNESIUM: Magnesium: 1.6 mg/dL — ABNORMAL LOW (ref 1.7–2.4)

## 2017-09-02 NOTE — Progress Notes (Signed)
HD Tx Ended, pt systtem clotted with 24 min on clock pt, rinsed back to save blood.

## 2017-09-02 NOTE — Progress Notes (Signed)
*  PRELIMINARY RESULTS* Echocardiogram 2D Echocardiogram has been performed.  Sherrie Sport 09/02/2017, 3:10 PM

## 2017-09-02 NOTE — Progress Notes (Signed)
[place] [facility] [place] NAME: Joyce [name]    MR#:  [id number]  DATE OF BIRTH:  03-May-1946  SUBJECTIVE:  CHIEF COMPLAINT:   Chief Complaint  Patient presents with  . Psychiatric Evaluation   The patient has no complaints.  She is alert, awake and oriented today. REVIEW OF SYSTEMS:  Review of Systems  Constitutional: Negative for chills, fever and malaise/fatigue.  HENT: Negative for sore throat.   Eyes: Negative for blurred vision and double vision.  Respiratory: Negative for cough, hemoptysis, shortness of breath, wheezing and stridor.   Cardiovascular: Negative for chest pain, palpitations, orthopnea and leg swelling.  Gastrointestinal: Negative for abdominal pain, blood in stool, diarrhea, melena, nausea and vomiting.  Genitourinary: Negative for dysuria, flank pain and hematuria.  Musculoskeletal: Negative for back pain and joint pain.  Skin: Negative for rash.  Neurological: Negative for dizziness, sensory change, focal weakness, seizures, loss of consciousness, weakness and headaches.  Endo/Heme/Allergies: Negative for polydipsia.  Psychiatric/Behavioral: Negative for depression. The patient is not nervous/anxious.     DRUG ALLERGIES:   Allergies  Allergen Reactions  . Morphine And Related Shortness Of Breath    Pt reports chest pain and difficulty breathing.  . Indomethacin Hives  . Pollen Extract Other (See Comments)    Sinus problems and HA   VITALS:  Blood pressure (!) 143/67, pulse 93, temperature 98 F (36.7 C), temperature source Axillary, resp. rate 16, height 5\' 3"  (1.6 m), weight 191 lb 12.8 oz (87 kg), SpO2 96 %. PHYSICAL EXAMINATION:  Physical Exam  Constitutional: She is oriented to person, place, and time and well-developed, well-nourished, and in no distress.  HENT:  Head: Normocephalic.  Mouth/Throat: Oropharynx is clear and moist.  Eyes: Conjunctivae and EOM are normal. Pupils are equal, round, and  reactive to light. No scleral icterus.  Neck: Normal range of motion. Neck supple. No JVD present. No tracheal deviation present.  Cardiovascular: Normal rate, regular rhythm and normal heart sounds. Exam reveals no gallop.  No murmur heard. Pulmonary/Chest: Effort normal and breath sounds normal. No respiratory distress. She has no wheezes. She has no rales.  Abdominal: Soft. Bowel sounds are normal. She exhibits no distension. There is no tenderness. There is no rebound.  Musculoskeletal: Normal range of motion. She exhibits no edema or tenderness.  Neurological: She is alert and oriented to person, place, and time. No cranial nerve deficit.  Skin: No rash noted. No erythema.  Psychiatric: Affect normal.   LABORATORY PANEL:  Female CBC Recent Labs  Lab 09/02/17 0426  WBC 6.3  HGB 11.5*  HCT 35.0  PLT 281   ------------------------------------------------------------------------------------------------------------------ Chemistries  Recent Labs  Lab 09/01/17 2144 09/02/17 0426  NA  --  142  K  --  3.1*  CL  --  107  CO2  --  22  GLUCOSE  --  83  BUN  --  17  CREATININE  --  3.47*  CALCIUM  --  9.0  MG  --  1.6*  AST 23  --   ALT 11*  --   ALKPHOS 48  --   BILITOT 0.6  --    RADIOLOGY:  Ct Head Wo Contrast  Result Date: 09/01/2017 CLINICAL DATA:  Altered mental status EXAM: CT HEAD WITHOUT CONTRAST TECHNIQUE: Contiguous axial images were obtained from the base of the skull through the vertex without intravenous contrast. COMPARISON:  Head CT 12/17/2013 FINDINGS: Brain: No mass lesion, intraparenchymal hemorrhage or extra-axial collection.  No evidence of acute cortical infarct. Brain parenchyma and CSF-containing spaces are normal for age. Vascular: No hyperdense vessel or unexpected calcification. Skull: Normal visualized skull base, calvarium and extracranial soft tissues. Sinuses/Orbits: No sinus fluid levels or advanced mucosal thickening. No mastoid effusion. Normal  orbits. IMPRESSION: Normal aging brain. Electronically Signed   By: Ulyses Jarred M.D.   On: 09/01/2017 17:35   Mr Brain Wo Contrast  Result Date: 09/02/2017 CLINICAL DATA:  Altered level of consciousness. End-stage renal disease EXAM: MRI HEAD WITHOUT CONTRAST TECHNIQUE: Multiplanar, multiecho pulse sequences of the brain and surrounding structures were obtained without intravenous contrast. COMPARISON:  CT head 09/01/2017, MRI 12/18/2013 FINDINGS: Brain: Image quality degraded by mild motion Mild generalized atrophy. Negative for hydrocephalus. Negative for acute infarct. Mild changes in the white matter consistent with chronic ischemia. Brainstem and basal ganglia normal. Negative for hemorrhage or mass. Small bilateral subdural hygromas around both cerebral hemispheres and the cerebellum similar to the prior MRI. No midline shift. Vascular: Normal arterial flow voids Skull and upper cervical spine: Negative Sinuses/Orbits: Mild mucosal edema paranasal sinuses. Bilateral cataract removal Other: None IMPRESSION: No acute abnormality Mild atrophy and mild chronic microvascular ischemic change in the white matter. Electronically Signed   By: Franchot Gallo M.D.   On: 09/02/2017 13:28   Dg Chest Portable 1 View  Result Date: 09/01/2017 CLINICAL DATA:  Abnormal behavior. EXAM: PORTABLE CHEST 1 VIEW COMPARISON:  08/17/2017. FINDINGS: Limited exam due to patient being uncooperative. Low lung volumes with mild bibasilar atelectasis/infiltrates. No pleural effusion or pneumothorax. Heart size stable. No acute bony abnormality. IMPRESSION: Limited exam due to the patient being uncooperative. Low lung volumes with mild bibasilar atelectasis/infiltrates. Electronically Signed   By: Marcello Moores  Register   On: 09/01/2017 17:17   ASSESSMENT AND PLAN:   1.  Acute encephalopathy etiology unclear Hold her pain medications Neurology consult: per Dr. Doy Mince, MRI brain. No acute abnormality per MRI. Follow-up with Dr.  Doy Mince.  2.  End-stage renal disease:  Hemodialysis today.  3.  Mild UTI treat with ceftriaxone, follow-up urine culture.  4.  History of bipolar disorder psych eval for hold Seroquel for now  5.  Elevated troponin likely due to demand ischemia follow troponin levels allergy to indomethacin will hold aspirin for now Echocardiogram for cardiologist's PA Mr. Idolina Primer.  6.  GERD, on Protonix  Hypokalemia and hypomagnesemia.  Adjust during hemodialysis today.  Discussed with Dr. Doy Mince. All the records are reviewed and case discussed with Care Management/Social Worker. Management plans discussed with the patient, her sister and they are in agreement.  CODE STATUS: Full Code  TOTAL TIME TAKING CARE OF THIS PATIENT: 30 minutes.   More than 50% of the time was spent in counseling/coordination of care: YES  POSSIBLE D/C IN 1-2 DAYS, DEPENDING ON CLINICAL CONDITION.   Demetrios Loll M.D on 09/02/2017 at 1:50 PM  Between 7am to 6pm - Pager - [phone]  After 6pm go to [email or web address] - [facility]

## 2017-09-02 NOTE — Progress Notes (Signed)
[facility] HD Tx

## 2017-09-02 NOTE — Care Management (Signed)
Amanda Morris dialysis liaison notified of admission.    

## 2017-09-02 NOTE — Consult Note (Signed)
Reason for Consult:AMS Referring Physician: Bridgett Larsson  CC: AMS  HPI: Joyce [name] is an 72 y.o. female with a known history of end-stage renal disease on hemodialysis, essential hypertension, bipolar affective disorder who presented to the ED on yesterday confused and agitated. Sister is in the room today and reports that the patient has been progressively confused over the past week and a half or so.  She missed dialysis on Monday of last week and has missed another dialysis since that time as well.     Past Medical History:  Diagnosis Date  . Anal fissure   . Bipolar affective disorder ([place])   . CKD (chronic kidney disease)    Dr Holley Raring [name] 4  . Colon polyps   . Diverticulitis   . Diverticulitis   . Family history of adverse reaction to anesthesia    mom - PONV  . GERD (gastroesophageal reflux disease)   . Headache    migraines - none over 10 yrs  . Heart murmur   . History of hiatal hernia   . Hypertension   . Pancreatitis    Valproic acid  . Vertigo     Past Surgical History:  Procedure Laterality Date  . A/V FISTULAGRAM Left 04/14/2017   Procedure: A/V Fistulagram;  Surgeon: Katha Cabal, MD;  Location: [facility];  Service: Cardiovascular;  Laterality: Left;  . A/V FISTULAGRAM Left 06/09/2017   Procedure: A/V FISTULAGRAM;  Surgeon: Katha Cabal, MD;  Location: [facility];  Service: Cardiovascular;  Laterality: Left;  . ABDOMINAL HYSTERECTOMY  1990 ?  . AV FISTULA PLACEMENT  4/30  . BREAST EXCISIONAL BIOPSY Left 1994   neg surgical bx  . CHOLECYSTECTOMY  2003  . COLONOSCOPY  2014   Dr. Jamal Collin  . COLONOSCOPY WITH PROPOFOL N/A 09/24/2015   Procedure: COLONOSCOPY WITH random colon byopies.;  Surgeon: Lucilla Lame, MD;  Location: [facility];  Service: Endoscopy;  Laterality: N/A;  . ESOPHAGOGASTRODUODENOSCOPY (EGD) WITH PROPOFOL N/A 09/24/2015   Procedure: ESOPHAGOGASTRODUODENOSCOPY (EGD) ;  Surgeon: Lucilla Lame, MD;  Location:  [facility];  Service: Endoscopy;  Laterality: N/A;  . EYE SURGERY    . PERIPHERAL VASCULAR CATHETERIZATION N/A 05/29/2015   Procedure: A/V Shuntogram/Fistulagram;  Surgeon: Katha Cabal, MD;  Location: [facility];  Service: Cardiovascular;  Laterality: N/A;  . PERIPHERAL VASCULAR CATHETERIZATION N/A 05/29/2015   Procedure: A/V Shunt Intervention;  Surgeon: Katha Cabal, MD;  Location: [facility];  Service: Cardiovascular;  Laterality: N/A;  . POLYPECTOMY  09/24/2015   Procedure: POLYPECTOMY INTESTINAL;  Surgeon: Lucilla Lame, MD;  Location: [facility];  Service: Endoscopy;;  cecal polyp ascending polyp    Family History  Problem Relation Age of Onset  . Stroke Father   . Hypertension Father   . Breast cancer Other   . Colon cancer Neg Hx   . Liver disease Neg Hx     Social History:  reports that  has never smoked. she has never used smokeless tobacco. She reports that she does not drink alcohol or use drugs.  Allergies  Allergen Reactions  . Morphine And Related Shortness Of Breath    Pt reports chest pain and difficulty breathing.  . Indomethacin Hives  . Pollen Extract Other (See Comments)    Sinus problems and HA    Medications:  I have reviewed the patient's current medications. Prior to Admission:  Medications Prior to Admission  Medication Sig Dispense Refill Last Dose  .  carbamazepine (TEGRETOL) 200 MG tablet Take 200 mg by mouth 2 (two) times daily.   09/01/2017 at Unknown time  . cholecalciferol (VITAMIN D) 1000 UNITS tablet Take 1,000 Units by mouth daily.   09/01/2017 at Unknown time  . cyanocobalamin (,VITAMIN B-12,) 1000 MCG/ML injection Inject 1,000 mcg into the muscle every 30 (thirty) days.   Past Month at Unknown time  . midodrine (PROAMATINE) 10 MG tablet Take 10 mg by mouth daily.    09/01/2017 at Unknown time  . pantoprazole (PROTONIX) 40 MG tablet TAKE 1 TABLET EVERY DAY 90 tablet 3 09/01/2017 at Unknown time  .  pramipexole (MIRAPEX) 0.125 MG tablet Take 0.125 mg by mouth daily at 2 PM.  3 08/31/2017 at Unknown time  . QUEtiapine (SEROQUEL) 25 MG tablet Take 50 mg by mouth at bedtime.    08/31/2017 at Unknown time  . timolol (TIMOPTIC) 0.5 % ophthalmic solution Place 1 drop 2 (two) times daily into both eyes.    09/01/2017 at Unknown time  . acetaminophen (TYLENOL) 325 MG tablet Take 325 mg 2 (two) times daily as needed by mouth for moderate pain or headache.    PRN at PRN  . bisacodyl (DULCOLAX) 5 MG EC tablet Take 1 tablet (5 mg total) by mouth daily as needed for moderate constipation. (Patient not taking: Reported on 04/09/2017) 30 tablet 0 Not Taking at Unknown time  . brimonidine (ALPHAGAN) 0.2 % ophthalmic solution Place 2 (two) times daily into both eyes.   PRN at PRN  . dicyclomine (BENTYL) 20 MG tablet Take 1 tablet (20 mg total) by mouth 3 (three) times daily before meals. (Patient not taking: Reported on 09/01/2017) 90 tablet 3 Not Taking at Unknown time  . lidocaine-prilocaine (EMLA) cream Apply 1 application every Monday, Wednesday, and Friday topically. At dialysis  3 PRN at PRN  . oxyCODONE-acetaminophen (PERCOCET/ROXICET) 5-325 MG tablet Take 1 tablet by mouth every 6 (six) hours as needed for moderate pain or severe pain. (Patient not taking: Reported on 03/11/2017) 25 tablet 0 Not Taking at Unknown time  . traMADol (ULTRAM) 50 MG tablet Take 1 tablet (50 mg total) by mouth every 6 (six) hours as needed for moderate pain or severe pain. (Patient not taking: Reported on 03/11/2017) 12 tablet 0 Not Taking at Unknown time   Scheduled: . brimonidine  1 drop Both Eyes BID  . carbamazepine  200 mg Oral BID  . cholecalciferol  1,000 Units Oral Daily  . dicyclomine  20 mg Oral TID AC  . heparin  5,000 Units Subcutaneous Q8H  . midodrine  10 mg Oral Daily  . pantoprazole  40 mg Oral Daily  . pramipexole  0.125 mg Oral Q1400  . sodium chloride flush  3 mL Intravenous Q12H  . timolol  1 drop Both Eyes  BID  . vitamin B-12  1,000 mcg Oral Daily    ROS: History obtained from the patient  General ROS: negative for - chills, fatigue, fever, night sweats, weight gain or weight loss Psychological ROS: as noted in HPI Ophthalmic ROS: negative for - blurry vision, double vision, eye pain or loss of vision ENT ROS: negative for - epistaxis, nasal discharge, oral lesions, sore throat, tinnitus or vertigo Allergy and Immunology ROS: negative for - hives or itchy/watery eyes Hematological and Lymphatic ROS: negative for - bleeding problems, bruising or swollen lymph nodes Endocrine ROS: negative for - galactorrhea, hair pattern changes, polydipsia/polyuria or temperature intolerance Respiratory ROS: negative for - cough, hemoptysis, shortness of breath  or wheezing Cardiovascular ROS: negative for - chest pain, dyspnea on exertion, edema or irregular heartbeat Gastrointestinal ROS: negative for - abdominal pain, diarrhea, hematemesis, nausea/vomiting or stool incontinence Genito-Urinary ROS: negative for - dysuria, hematuria, incontinence or urinary frequency/urgency Musculoskeletal ROS: negative for - joint swelling or muscular weakness Neurological ROS: as noted in HPI Dermatological ROS: negative for rash and skin lesion changes  Physical Examination: Blood pressure (!) 143/67, pulse 93, temperature 98 F (36.7 C), temperature source Axillary, resp. rate 16, height 5\' 3"  (1.6 m), weight 87 kg (191 lb 12.8 oz), SpO2 96 %.  HEENT-  Normocephalic, no lesions, without obvious abnormality.  Normal external eye and conjunctiva.  Normal TM's bilaterally.  Normal auditory canals and external ears. Normal external nose, mucus membranes and septum.  Normal pharynx. Cardiovascular- S1, S2 normal, pulses palpable throughout   Lungs- chest clear, no wheezing, rales, normal symmetric air entry Abdomen- soft, non-tender; bowel sounds normal; no masses,  no organomegaly Extremities- no edema Lymph-no  adenopathy palpable Musculoskeletal-no joint tenderness, deformity or swelling Skin-warm and dry, no hyperpigmentation, vitiligo, or suspicious lesions  Neurological Examination   Mental Status: Alert, oriented other than being unclear about the day of the week, thought content appropriate.  Speech fluent without evidence of aphasia.  Dysarthric.  Needs mild reinforcement for 3 step commands. Cranial Nerves: II: Discs flat bilaterally; Visual fields grossly normal, pupils equal, round, reactive to light and accommodation III,IV, VI: ptosis not present, extra-ocular motions intact bilaterally V,VII: smile symmetric, facial light touch sensation normal bilaterally VIII: hearing normal bilaterally IX,X: gag reflex present XI: bilateral shoulder shrug XII: midline tongue extension Motor: Right : Upper extremity   5/5    Left:     Upper extremity   5/5  Lower extremity   5/5     Lower extremity   5/5 Tone and bulk:normal tone throughout; no atrophy noted Sensory: Pinprick and light touch intact throughout, bilaterally Deep Tendon Reflexes: 2+ in the upper extremities, 1+ KJ's and absent AJ's bilaterally Plantars: Right: mute   Left: mute Cerebellar: Normal finger-to-nose and normal heel-to-shin testing bilaterally Gait: not tested due to safety concerns    Laboratory Studies:   Basic Metabolic Panel: Recent Labs  Lab 09/01/17 1632 09/02/17 0426  NA 137 142  K 2.9* 3.1*  CL 96* 107  CO2 27 22  GLUCOSE 112* 83  BUN 17 17  CREATININE 3.32* 3.47*  CALCIUM 10.1 9.0  MG  --  1.6*    Liver Function Tests: Recent Labs  Lab 09/01/17 1632 09/01/17 2144  AST 25 23  ALT 13* 11*  ALKPHOS 57 48  BILITOT 0.8 0.6  PROT 8.0 6.7  ALBUMIN 4.6 3.8   No results for input(s): LIPASE, AMYLASE in the last 168 hours. No results for input(s): AMMONIA in the last 168 hours.  CBC: Recent Labs  Lab 09/01/17 1632 09/02/17 0426  WBC 9.7 6.3  NEUTROABS 8.1*  --   HGB 12.9 11.5*  HCT  38.1 35.0  MCV 93.3 96.5  PLT 357 281    Cardiac Enzymes: Recent Labs  Lab 09/01/17 1632 09/02/17 0426  TROPONINI 0.04* 0.07*    BNP: Invalid input(s): POCBNP  CBG: No results for input(s): GLUCAP in the last 168 hours.  Microbiology: Results for orders placed or performed during the hospital encounter of 09/01/17  Culture, blood (routine x 2)     Status: None (Preliminary result)   Collection Time: 09/01/17  6:05 PM  Result Value Ref Range Status  Specimen Description BLOOD RIGHT HAND  Final   Special Requests   Final    BOTTLES DRAWN AEROBIC AND ANAEROBIC Blood Culture results may not be optimal due to an excessive volume of blood received in culture bottles   Culture   Final    NO GROWTH < 12 HOURS Performed at [facility], [address] [place], [place] [address]    Report Status PENDING  Incomplete  Culture, blood (routine x 2)     Status: None (Preliminary result)   Collection Time: 09/01/17  6:34 PM  Result Value Ref Range Status   Specimen Description BLOOD RAC  Final   Special Requests   Final    BOTTLES DRAWN AEROBIC AND ANAEROBIC Blood Culture adequate volume   Culture   Final    NO GROWTH < 12 HOURS Performed at [facility], [address] [place], [place] [address]    Report Status PENDING  Incomplete    Coagulation Studies: No results for input(s): LABPROT, INR in the last 72 hours.  Urinalysis:  Recent Labs  Lab 09/01/17 1641  COLORURINE YELLOW*  LABSPEC 1.005  PHURINE 7.0  GLUCOSEU NEGATIVE  HGBUR NEGATIVE  BILIRUBINUR NEGATIVE  KETONESUR NEGATIVE  PROTEINUR NEGATIVE  NITRITE NEGATIVE  LEUKOCYTESUR SMALL*    Lipid Panel:     Component Value Date/Time   CHOL 105 05/14/2014 0412   TRIG 127 05/14/2014 0412   HDL 37 (L) 05/14/2014 0412   VLDL 25 05/14/2014 0412   LDLCALC 43 05/14/2014 0412    HgbA1C: No results found for: HGBA1C  Urine Drug Screen:      Component Value Date/Time   LABOPIA NONE  DETECTED 09/01/2017 1632   COCAINSCRNUR NONE DETECTED 09/01/2017 1632   LABBENZ NONE DETECTED 09/01/2017 1632   AMPHETMU NONE DETECTED 09/01/2017 1632   THCU NONE DETECTED 09/01/2017 1632   LABBARB NONE DETECTED 09/01/2017 1632    Alcohol Level:  Recent Labs  Lab 09/01/17 [place] <10    Other results: EKG: normal sinus rhythm at 96 bpm.  Imaging: Ct Head Wo Contrast  Result Date: 09/01/2017 CLINICAL DATA:  Altered mental status EXAM: CT HEAD WITHOUT CONTRAST TECHNIQUE: Contiguous axial images were obtained from the base of the skull through the vertex without intravenous contrast. COMPARISON:  Head CT 12/17/2013 FINDINGS: Brain: No mass lesion, intraparenchymal hemorrhage or extra-axial collection. No evidence of acute cortical infarct. Brain parenchyma and CSF-containing spaces are normal for age. Vascular: No hyperdense vessel or unexpected calcification. Skull: Normal visualized skull base, calvarium and extracranial soft tissues. Sinuses/Orbits: No sinus fluid levels or advanced mucosal thickening. No mastoid effusion. Normal orbits. IMPRESSION: Normal aging brain. Electronically Signed   By: Ulyses Jarred M.D.   On: 09/01/2017 17:35   Dg Chest Portable 1 View  Result Date: 09/01/2017 CLINICAL DATA:  Abnormal behavior. EXAM: PORTABLE CHEST 1 VIEW COMPARISON:  08/17/2017. FINDINGS: Limited exam due to patient being uncooperative. Low lung volumes with mild bibasilar atelectasis/infiltrates. No pleural effusion or pneumothorax. Heart size stable. No acute bony abnormality. IMPRESSION: Limited exam due to the patient being uncooperative. Low lung volumes with mild bibasilar atelectasis/infiltrates. Electronically Signed   By: Marcello Moores  Register   On: 09/01/2017 17:17     Assessment/Plan: 72 year old female with multiple medical problems including ESRD and HTN who presents with progressive confusion.  Today she reports being very close to baseline.  Unclear etiology.  Lab work fairly  unremarkable.  Tegretol level therapeutic.  Head CT less than optimal  due to cooperation but shows no acute changes.    Recommendations: 1. MRI of the brain without contrast 2. EEG  Alexis Goodell, MD Neurology [phone] 09/02/2017, 9:56 AM

## 2017-09-02 NOTE — Progress Notes (Signed)
Pre HD Tx  

## 2017-09-02 NOTE — Progress Notes (Signed)
    Consult placed for troponin of 0.04-->0.07 in the setting of ESRD on HD and UTI. Discussed with IM, will check echo, if abnormal will see formally.

## 2017-09-02 NOTE — Consult Note (Signed)
[facility] Consult   Reason for Consult: Consult for 72 year old woman with a history of bipolar disorder and multiple medical problems who presented to the emergency room last night with altered mental status Referring Physician:  Bridgett Larsson Patient Identification: Joyce [name] MRN:  [id number] Principal Diagnosis: Bipolar I disorder, most recent episode (or current) manic ([place]) Diagnosis:   Patient Active Problem List   Diagnosis Date Noted  . Bipolar I disorder, most recent episode (or current) manic ([place]) [F31.10] 09/02/2017  . Acute delirium [R41.0] 09/02/2017  . Altered mental status [R41.82]   . Acute encephalopathy [G93.40] 09/01/2017  . ESRD on dialysis ([place]) [N18.6, Z99.2] 04/06/2017  . Complication of vascular access for dialysis [T82.9XXA] 04/06/2017  . Hematuria [R31.9] 12/18/2016  . Polycystic kidney [Q61.3] 10/12/2016  . Ruptured cyst of kidney [Q61.00] 10/03/2016  . HTN (hypertension) [I10] 10/01/2016  . Chronic kidney disease [N18.9] 10/01/2016  . Anemia associated with chronic renal failure [D63.1] 09/24/2016  . Acute kidney insufficiency [N28.9] 07/28/2016  . Noninfectious diarrhea [K52.9]   . Benign neoplasm of cecum [D12.0]   . Benign neoplasm of ascending colon [D12.2]   . Diarrhea [R19.7]   . Nausea [R11.0]   . Gastric polyp [K31.7]   . Chronic constipation [K59.09] 12/27/2014  . Nausea with vomiting [R11.2] 12/27/2014  . Left sided abdominal pain [R10.9] 12/27/2014  . Personal history of colonic polyps [Z86.010] 05/10/2013    Total Time spent with patient: 1 hour  Subjective:   Joyce [name] is a 72 y.o. female patient admitted with "I think it was just a downward spiral because I had not been sleeping".  HPI: Patient interviewed.  Chart reviewed.  Labs reviewed.  72 year old woman presented to the emergency room last night brought in by her family with reports of altered mental status.  Based on the description of the ER providers it  sounds like she was somewhat agitated and confused hyperactive but not violent.  Patient is a dialysis patient and had missed a session or may be to of dialysis.  She was admitted to the medical service for stabilization.  Head CT was performed and showed no acute abnormality.  Tegretol level was done and is in the normal range.  MRI scan has been done today and also shows no acute abnormality.  On interview today the patient appears to have greatly improved.  She tells me "I am feeling much better".  She tells me that for about 2 or 3 days she felt like her mind was racing like there was a motor in it that would not turn off.  She had been sleeping poorly and was "badly off schedule" for a few days.  Patient remembers that she was having visual hallucinations and feeling confused.  She thinks that she had been compliant with all of her medicines as usual although it sounds like she might of made some mistakes during her confusion.  Currently she says she is feeling much better.  Mood is back to feeling normal.  Thoughts appear to be clear.  Denies any hallucinations.  Does not report any dangerous thinking.  Medical history: Patient is on hemodialysis and has some chronic GI problems as well.  She has a history of restless legs.  She also mentions that she had recently had some confusion about whether or not she should be taking her medicine for gastric reflux.  Social history: Patient lives independently in a trailer.  She has good relationship with her daughter and her sister.  She says there has been some family drama recently that involves her daughter-in-law who she felt was not treating her well.  Past Psychiatric History: Patient has a diagnosis of bipolar disorder of long-standing.  She is a patient of Dr. [place] Butts.  She is on Tegretol 200 mg twice a day and a low dose of Seroquel in the evening and these medicines have been stable for a while.  She says she has not been in the psychiatric hospital in  many years.  Does have a past suicide attempt but it was also many years ago.  Risk to Self: Is patient at risk for suicide?: No Risk to Others:   Prior Inpatient Therapy:   Prior Outpatient Therapy:    Past Medical History:  Past Medical History:  Diagnosis Date  . Anal fissure   . Bipolar affective disorder ([place])   . CKD (chronic kidney disease)    Dr Holley Raring [name] 4  . Colon polyps   . Diverticulitis   . Diverticulitis   . Family history of adverse reaction to anesthesia    mom - PONV  . GERD (gastroesophageal reflux disease)   . Headache    migraines - none over 10 yrs  . Heart murmur   . History of hiatal hernia   . Hypertension   . Pancreatitis    Valproic acid  . Vertigo     Past Surgical History:  Procedure Laterality Date  . A/V FISTULAGRAM Left 04/14/2017   Procedure: A/V Fistulagram;  Surgeon: Katha Cabal, MD;  Location: [facility];  Service: Cardiovascular;  Laterality: Left;  . A/V FISTULAGRAM Left 06/09/2017   Procedure: A/V FISTULAGRAM;  Surgeon: Katha Cabal, MD;  Location: [facility];  Service: Cardiovascular;  Laterality: Left;  . ABDOMINAL HYSTERECTOMY  1990 ?  . AV FISTULA PLACEMENT  4/30  . BREAST EXCISIONAL BIOPSY Left 1994   neg surgical bx  . CHOLECYSTECTOMY  2003  . COLONOSCOPY  2014   Dr. Jamal Collin  . COLONOSCOPY WITH PROPOFOL N/A 09/24/2015   Procedure: COLONOSCOPY WITH random colon byopies.;  Surgeon: Lucilla Lame, MD;  Location: [facility];  Service: Endoscopy;  Laterality: N/A;  . ESOPHAGOGASTRODUODENOSCOPY (EGD) WITH PROPOFOL N/A 09/24/2015   Procedure: ESOPHAGOGASTRODUODENOSCOPY (EGD) ;  Surgeon: Lucilla Lame, MD;  Location: [facility];  Service: Endoscopy;  Laterality: N/A;  . EYE SURGERY    . PERIPHERAL VASCULAR CATHETERIZATION N/A 05/29/2015   Procedure: A/V Shuntogram/Fistulagram;  Surgeon: Katha Cabal, MD;  Location: [facility];  Service: Cardiovascular;  Laterality: N/A;  .  PERIPHERAL VASCULAR CATHETERIZATION N/A 05/29/2015   Procedure: A/V Shunt Intervention;  Surgeon: Katha Cabal, MD;  Location: [facility];  Service: Cardiovascular;  Laterality: N/A;  . POLYPECTOMY  09/24/2015   Procedure: POLYPECTOMY INTESTINAL;  Surgeon: Lucilla Lame, MD;  Location: [facility];  Service: Endoscopy;;  cecal polyp ascending polyp   Family History:  Family History  Problem Relation Age of Onset  . Stroke Father   . Hypertension Father   . Breast cancer Other   . Colon cancer Neg Hx   . Liver disease Neg Hx    Family Psychiatric  History: Does not know of any family history of mental illness Social History:  Social History   Substance and Sexual Activity  Alcohol Use No  . Alcohol/week: 0.0 oz     Social History   Substance and Sexual Activity  Drug Use No    Social  History   Socioeconomic History  . Marital status: Single    Spouse name: None  . Number of children: 1  . Years of education: None  . Highest education level: None  Social Needs  . Financial resource strain: None  . Food insecurity - worry: None  . Food insecurity - inability: None  . Transportation needs - medical: None  . Transportation needs - non-medical: None  Occupational History  . Occupation: Surveyor, quantity: [employer]  Tobacco Use  . Smoking status: Never Smoker  . Smokeless tobacco: Never Used  Substance and Sexual Activity  . Alcohol use: No    Alcohol/week: 0.0 oz  . Drug use: No  . Sexual activity: Not Currently  Other Topics Concern  . 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   Additional Social History:    Allergies:   Allergies  Allergen Reactions  . Morphine And Related Shortness Of Breath    Pt reports chest pain and difficulty breathing.  . Indomethacin Hives  . Pollen Extract Other (See Comments)    Sinus problems and HA    Labs:  Results for  orders placed or performed during the hospital encounter of 09/01/17 (from the past 48 hour(s))  Comprehensive metabolic panel     Status: Abnormal   Collection Time: 09/01/17  4:32 PM  Result Value Ref Range   Sodium 137 135 - 145 mmol/L   Potassium 2.9 (L) 3.5 - 5.1 mmol/L   Chloride 96 (L) 101 - 111 mmol/L   CO2 27 22 - 32 mmol/L   Glucose, Bld 112 (H) 65 - 99 mg/dL   BUN 17 6 - 20 mg/dL   Creatinine, Ser 3.32 (H) 0.44 - 1.00 mg/dL   Calcium 10.1 8.9 - 10.3 mg/dL   Total Protein 8.0 6.5 - 8.1 g/dL   Albumin 4.6 3.5 - 5.0 g/dL   AST 25 15 - 41 U/L   ALT 13 (L) 14 - 54 U/L   Alkaline Phosphatase 57 38 - 126 U/L   Total Bilirubin 0.8 0.3 - 1.2 mg/dL   GFR calc non Af Amer 13 (L) >60 mL/min   GFR calc Af Amer 15 (L) >60 mL/min    Comment: (NOTE) The eGFR has been calculated using the CKD EPI equation. This calculation has not been validated in all clinical situations. eGFR's persistently <60 mL/min signify possible Chronic Kidney Disease.    Anion gap 14 5 - 15    Comment: Performed at [facility], [address] [place]  [address]  Ethanol     Status: None   Collection Time: 09/01/17  4:32 PM  Result Value Ref Range   Alcohol, Ethyl (B) <10 <10 mg/dL    Comment:        LOWEST DETECTABLE LIMIT FOR SERUM ALCOHOL IS 10 mg/dL FOR MEDICAL PURPOSES ONLY Performed at [facility], [address] [place], [place] [address]   Salicylate level     Status: None   Collection Time: 09/01/17  4:32 PM  Result Value Ref Range   Salicylate Lvl <4.1 2.8 - 30.0 mg/dL    Comment: Performed at [facility], [address] [place], [place] [address]  Acetaminophen level     Status: Abnormal   Collection Time: 09/01/17  4:32 PM  Result Value Ref Range   Acetaminophen (Tylenol), Serum <10 (L) 10 - 30 ug/mL  Comment:        THERAPEUTIC CONCENTRATIONS VARY SIGNIFICANTLY. A RANGE OF 10-30 ug/mL MAY BE AN EFFECTIVE CONCENTRATION FOR MANY  PATIENTS. HOWEVER, SOME ARE BEST TREATED AT CONCENTRATIONS OUTSIDE THIS RANGE. ACETAMINOPHEN CONCENTRATIONS >150 ug/mL AT 4 HOURS AFTER INGESTION AND >50 ug/mL AT 12 HOURS AFTER INGESTION ARE OFTEN ASSOCIATED WITH TOXIC REACTIONS. Performed at [facility], [address] [place], [place] [address]   Urine Drug Screen, Qualitative     Status: None   Collection Time: 09/01/17  4:32 PM  Result Value Ref Range   Tricyclic, Ur Screen NONE DETECTED NONE DETECTED   Amphetamines, Ur Screen NONE DETECTED NONE DETECTED   MDMA (Ecstasy)Ur Screen NONE DETECTED NONE DETECTED   Cocaine Metabolite,Ur [place] NONE DETECTED NONE DETECTED   Opiate, Ur Screen NONE DETECTED NONE DETECTED   Phencyclidine (PCP) Ur S NONE DETECTED NONE DETECTED   Cannabinoid 50 Ng, Ur [place] NONE DETECTED NONE DETECTED   Barbiturates, Ur Screen NONE DETECTED NONE DETECTED   Benzodiazepine, Ur Scrn NONE DETECTED NONE DETECTED   Methadone Scn, Ur NONE DETECTED NONE DETECTED    Comment: (NOTE) Tricyclics + metabolites, urine    Cutoff 1000 ng/mL Amphetamines + metabolites, urine  Cutoff 1000 ng/mL MDMA (Ecstasy), urine              Cutoff 500 ng/mL Cocaine Metabolite, urine          Cutoff 300 ng/mL Opiate + metabolites, urine        Cutoff 300 ng/mL Phencyclidine (PCP), urine         Cutoff 25 ng/mL Cannabinoid, urine                 Cutoff 50 ng/mL Barbiturates + metabolites, urine  Cutoff 200 ng/mL Benzodiazepine, urine              Cutoff 200 ng/mL Methadone, urine                   Cutoff 300 ng/mL The urine drug screen provides only a preliminary, unconfirmed analytical test result and should not be used for non-medical purposes. Clinical consideration and professional judgment should be applied to any positive drug screen result due to possible interfering substances. A more specific alternate chemical method must be used in order to obtain a confirmed analytical result. Gas chromatography / mass  spectrometry (GC/MS) is the preferred confirmat ory method. Performed at [facility], [address] [place], [place] [address]   Troponin I     Status: Abnormal   Collection Time: 09/01/17  4:32 PM  Result Value Ref Range   Troponin I 0.04 (HH) <0.03 ng/mL    Comment: CRITICAL RESULT CALLED TO, READ BACK BY AND VERIFIED WITH AMY TEAGUE 09/01/17 1736 KLW Performed at [facility], [address] [place], [place] [address]   CBC with Differential     Status: Abnormal   Collection Time: 09/01/17  4:32 PM  Result Value Ref Range   WBC 9.7 3.6 - 11.0 K/uL   RBC 4.08 3.80 - 5.20 MIL/uL   Hemoglobin 12.9 12.0 - 16.0 g/dL   HCT 38.1 35.0 - 47.0 %   MCV 93.3 80.0 - 100.0 fL   MCH 31.6 26.0 - 34.0 pg   MCHC 33.9 32.0 - 36.0 g/dL   RDW 14.5 11.5 - 14.5 %   Platelets 357 150 - 440 K/uL   Neutrophils Relative % 83 %   Neutro Abs 8.1 (H) 1.4 - 6.5 K/uL  Lymphocytes Relative 10 %   Lymphs Abs 1.0 1.0 - 3.6 K/uL   Monocytes Relative 6 %   Monocytes Absolute 0.6 0.2 - 0.9 K/uL   Eosinophils Relative 0 %   Eosinophils Absolute 0.0 0 - 0.7 K/uL   Basophils Relative 1 %   Basophils Absolute 0.1 0 - 0.1 K/uL    Comment: Performed at [facility], [address] [place], [place] [address]  Carbamazepine level, total     Status: None   Collection Time: 09/01/17  4:32 PM  Result Value Ref Range   Carbamazepine Lvl 7.7 4.0 - 12.0 ug/mL    Comment: Performed at [facility], [address] [place], [place] [address]  Lactic acid, plasma     Status: Abnormal   Collection Time: 09/01/17  4:41 PM  Result Value Ref Range   Lactic Acid, Venous 2.1 (HH) 0.5 - 1.9 mmol/L    Comment: CRITICAL RESULT CALLED TO, READ BACK BY AND VERIFIED WITH ALLISON PATE 09/01/17 1930 KLW Performed at [facility], [address] [place], [place] [address]   Urinalysis, Complete w Microscopic     Status: Abnormal   Collection Time: 09/01/17  4:41 PM  Result  Value Ref Range   Color, Urine YELLOW (A) YELLOW   APPearance CLEAR (A) CLEAR   Specific Gravity, Urine 1.005 1.005 - 1.030   pH 7.0 5.0 - 8.0   Glucose, UA NEGATIVE NEGATIVE mg/dL   Hgb urine dipstick NEGATIVE NEGATIVE   Bilirubin Urine NEGATIVE NEGATIVE   Ketones, ur NEGATIVE NEGATIVE mg/dL   Protein, ur NEGATIVE NEGATIVE mg/dL   Nitrite NEGATIVE NEGATIVE   Leukocytes, UA SMALL (A) NEGATIVE   RBC / HPF 0-5 0 - 5 RBC/hpf   WBC, UA 6-30 0 - 5 WBC/hpf   Bacteria, UA NONE SEEN NONE SEEN   Squamous Epithelial / LPF 0-5 (A) NONE SEEN    Comment: Performed at [facility], [address] [place], [place] [address]  Culture, blood (routine x 2)     Status: None (Preliminary result)   Collection Time: 09/01/17  6:05 PM  Result Value Ref Range   Specimen Description BLOOD RIGHT HAND    Special Requests      BOTTLES DRAWN AEROBIC AND ANAEROBIC Blood Culture results may not be optimal due to an excessive volume of blood received in culture bottles   Culture      NO GROWTH < 12 HOURS Performed at [facility], [address] [place], [place] [address]    Report Status PENDING   Culture, blood (routine x 2)     Status: None (Preliminary result)   Collection Time: 09/01/17  6:34 PM  Result Value Ref Range   Specimen Description BLOOD RAC    Special Requests      BOTTLES DRAWN AEROBIC AND ANAEROBIC Blood Culture adequate volume   Culture      NO GROWTH < 12 HOURS Performed at [facility], [address] [place], [place] [address]    Report Status PENDING   Lactic acid, plasma     Status: None   Collection Time: 09/01/17  9:44 PM  Result Value Ref Range   Lactic Acid, Venous 1.7 0.5 - 1.9 mmol/L    Comment: Performed at [facility], [address], [place], [place] [address]  Hepatic function panel     Status: Abnormal   Collection Time: 09/01/17  9:44 PM  Result Value Ref Range   Total Protein  6.7 6.5 - 8.1 g/dL   Albumin 3.8 3.5 - 5.0  g/dL   AST 23 15 - 41 U/L   ALT 11 (L) 14 - 54 U/L   Alkaline Phosphatase 48 38 - 126 U/L   Total Bilirubin 0.6 0.3 - 1.2 mg/dL   Bilirubin, Direct <0.1 (L) 0.1 - 0.5 mg/dL   Indirect Bilirubin NOT CALCULATED 0.3 - 0.9 mg/dL    Comment: Performed at [facility], [address] [place], [place] [address]  TSH     Status: None   Collection Time: 09/01/17  9:44 PM  Result Value Ref Range   TSH 3.406 0.350 - 4.500 uIU/mL    Comment: Performed by a 3rd Generation assay with a functional sensitivity of <=0.01 uIU/mL. Performed at [facility], [address] [place], [place] [address]   Troponin I     Status: Abnormal   Collection Time: 09/02/17  4:26 AM  Result Value Ref Range   Troponin I 0.07 (HH) <0.03 ng/mL    Comment: CRITICAL VALUE NOTED. VALUE IS CONSISTENT WITH PREVIOUSLY REPORTED/CALLED VALUE. JAG Performed at [facility], [address] [place], [place] [address]   CBC     Status: Abnormal   Collection Time: 09/02/17  4:26 AM  Result Value Ref Range   WBC 6.3 3.6 - 11.0 K/uL   RBC 3.62 (L) 3.80 - 5.20 MIL/uL   Hemoglobin 11.5 (L) 12.0 - 16.0 g/dL   HCT 35.0 35.0 - 47.0 %   MCV 96.5 80.0 - 100.0 fL   MCH 31.9 26.0 - 34.0 pg   MCHC 33.0 32.0 - 36.0 g/dL   RDW 14.8 (H) 11.5 - 14.5 %   Platelets 281 150 - 440 K/uL    Comment: Performed at [facility], [address] [place], [place] [address]  Basic metabolic panel     Status: Abnormal   Collection Time: 09/02/17  4:26 AM  Result Value Ref Range   Sodium 142 135 - 145 mmol/L   Potassium 3.1 (L) 3.5 - 5.1 mmol/L   Chloride 107 101 - 111 mmol/L   CO2 22 22 - 32 mmol/L   Glucose, Bld 83 65 - 99 mg/dL   BUN 17 6 - 20 mg/dL   Creatinine, Ser 3.47 (H) 0.44 - 1.00 mg/dL   Calcium 9.0 8.9 - 10.3 mg/dL   GFR calc non Af Amer 12 (L) >60 mL/min   GFR calc Af Amer 14 (L) >60 mL/min    Comment: (NOTE) The eGFR has been calculated using the CKD EPI equation. This calculation has not  been validated in all clinical situations. eGFR's persistently <60 mL/min signify possible Chronic Kidney Disease.    Anion gap 13 5 - 15    Comment: Performed at [facility], [address] [place], [place] [address]  Magnesium     Status: Abnormal   Collection Time: 09/02/17  4:26 AM  Result Value Ref Range   Magnesium 1.6 (L) 1.7 - 2.4 mg/dL    Comment: Performed at [facility], [address] [place], [place] [address]    Current Facility-Administered Medications  Medication Dose Route Frequency Provider Last Rate Last Dose  . 0.9 %  sodium chloride infusion  250 mL Intravenous PRN Dustin Flock, MD      . acetaminophen (TYLENOL) tablet 650 mg  650 mg Oral Q6H PRN Dustin Flock, MD       Or  . acetaminophen (TYLENOL) suppository 650 mg  650  mg Rectal Q6H PRN Dustin Flock, MD      . bisacodyl (DULCOLAX) EC tablet 5 mg  5 mg Oral Daily PRN Dustin Flock, MD      . brimonidine (ALPHAGAN) 0.2 % ophthalmic solution 1 drop  1 drop Both Eyes BID Dustin Flock, MD   1 drop at 09/02/17 0919  . carbamazepine (TEGRETOL) tablet 200 mg  200 mg Oral BID Dustin Flock, MD   200 mg at 09/02/17 0910  . cefTRIAXone (ROCEPHIN) 1 g in sodium chloride 0.9 % 100 mL IVPB  1 g Intravenous Q24H Dustin Flock, MD      . cholecalciferol (VITAMIN D) tablet 1,000 Units  1,000 Units Oral Daily Dustin Flock, MD   1,000 Units at 09/02/17 0909  . dicyclomine (BENTYL) tablet 20 mg  20 mg Oral TID AC Dustin Flock, MD   20 mg at 09/02/17 0814  . heparin injection 5,000 Units  5,000 Units Subcutaneous Q8H Dustin Flock, MD   5,000 Units at 09/02/17 1313  . midodrine (PROAMATINE) tablet 10 mg  10 mg Oral Daily Dustin Flock, MD   10 mg at 09/02/17 0909  . ondansetron (ZOFRAN) tablet 4 mg  4 mg Oral Q6H PRN Dustin Flock, MD       Or  . ondansetron (ZOFRAN) injection 4 mg  4 mg Intravenous Q6H PRN Dustin Flock, MD      . pantoprazole (PROTONIX) EC tablet 40 mg  40 mg  Oral Daily Dustin Flock, MD   40 mg at 09/02/17 0909  . pramipexole (MIRAPEX) tablet 0.125 mg  0.125 mg Oral Q1400 Dustin Flock, MD   0.125 mg at 09/02/17 1313  . sodium chloride flush (NS) 0.9 % injection 3 mL  3 mL Intravenous Q12H Dustin Flock, MD   3 mL at 09/02/17 0920  . sodium chloride flush (NS) 0.9 % injection 3 mL  3 mL Intravenous PRN Dustin Flock, MD      . timolol (TIMOPTIC) 0.5 % ophthalmic solution 1 drop  1 drop Both Eyes BID Dustin Flock, MD   1 drop at 09/02/17 0915  . vitamin B-12 (CYANOCOBALAMIN) tablet 1,000 mcg  1,000 mcg Oral Daily Dustin Flock, MD   1,000 mcg at 09/02/17 0909    Musculoskeletal: Strength & Muscle Tone: within normal limits Gait & Station: unsteady Patient leans: N/A  Psychiatric Specialty Exam: Physical Exam  Nursing note and vitals reviewed. Constitutional: She appears well-developed and well-nourished.  HENT:  Head: Normocephalic and atraumatic.  Eyes: Conjunctivae are normal. Pupils are equal, round, and reactive to light.  Neck: Normal range of motion.  Cardiovascular: Regular rhythm and normal heart sounds.  Respiratory: No respiratory distress.  GI: Soft.  Musculoskeletal: Normal range of motion.  Neurological: She is alert.  Skin: Skin is warm and dry.  Psychiatric: She has a normal mood and affect. Her speech is normal and behavior is normal. Judgment and thought content normal. She exhibits abnormal recent memory.    Review of Systems  Constitutional: Negative.   HENT: Negative.   Eyes: Negative.   Respiratory: Negative.   Cardiovascular: Negative.   Gastrointestinal: Negative.   Musculoskeletal: Negative.   Skin: Negative.   Neurological: Negative.   Psychiatric/Behavioral: Positive for hallucinations and memory loss. Negative for depression, substance abuse and suicidal ideas. The patient is nervous/anxious and has insomnia.     Blood pressure (!) 143/67, pulse 93, temperature 98 F (36.7 C), temperature  source Axillary, resp. rate 16, height '5\' 3"'$  (1.6 m), weight 87 kg (  191 lb 12.8 oz), SpO2 96 %.Body mass index is 33.98 kg/m.  General Appearance: Casual  Eye Contact:  Good  Speech:  Clear and Coherent  Volume:  Normal  Mood:  Euthymic  Affect:  Appropriate  Thought Process:  Coherent and Goal Directed  Orientation:  Full (Time, Place, and Person)  Thought Content:  Logical  Suicidal Thoughts:  No  Homicidal Thoughts:  No  Memory:  Immediate;   Good Recent;   Fair Remote;   Fair  Judgement:  Fair  Insight:  Good  Psychomotor Activity:  Normal  Concentration:  Concentration: Fair  Recall:  [place] of Knowledge:  Good  Language:  Good  Akathisia:  No  Handed:  Right  AIMS (if indicated):     Assets:  Communication Skills Desire for Improvement Financial Resources/Insurance Housing Resilience Social Support  ADL's:  Intact  Cognition:  WNL  Sleep:        Treatment Plan Summary: Medication management and Plan 72 year old woman with a history of bipolar disorder who presented with altered mental status.  Workup thus far has not shown any specific abnormality although she tells me that she was told that it could be related to a urinary tract infection.  Based on what she is telling me today it sounds like the symptoms were probably a manifestation of her bipolar disorder that had been exacerbated by poor sleep for a couple of days, possibly some mistakes with her medicine, possibly some emotional stress and may be a contribution from her chronic medical problems.  In any case, she appears to be pretty much back to her baseline right now.  She was alert and oriented and completely appropriate in her interaction.  She could tell me the names of all of her physicians and when she saw them in which medicine she was taking.  Her short-term memory was very slightly impaired but nothing out of the ordinary.  No sign of any dangerousness.  Patient is already on her appropriate Tegretol  and quetiapine dose.  I told her that I will try to get a message to Dr. [place] Butts about this and I am going to do that.  I will check in tomorrow if she is still here but otherwise I am hopeful that may be this has been resolved.  Disposition: No evidence of imminent risk to self or others at present.   Patient does not meet criteria for psychiatric inpatient admission. Supportive therapy provided about ongoing stressors.  Alethia Berthold, MD 09/02/2017 2:23 PM

## 2017-09-02 NOTE — Progress Notes (Signed)
Pre HD Assessment  

## 2017-09-02 NOTE — Progress Notes (Signed)
Post Hd Assessment

## 2017-09-02 NOTE — Progress Notes (Signed)
[facility]  ROUNDING NOTE   Subjective:  Patient comes in with confusion. When interviewed earlier this a.m. however her mental status appeared to be at her normal baseline. CT head was negative. Urinalysis suggests possible UTI.   Objective:  Vital signs in last 24 hours:  Temp:  [97.8 F (36.6 C)-98.1 F (36.7 C)] (P) 98.1 F (36.7 C) (02/13 1523) Pulse Rate:  [63-99] 67 (02/13 1545) Resp:  [11-20] 11 (02/13 1545) BP: (139-169)/(55-91) 164/68 (02/13 1545) SpO2:  [95 %-100 %] 100 % (02/13 1545) Weight:  [79.4 kg (175 lb)-87 kg (191 lb 12.8 oz)] (P) 87 kg (191 lb 12.8 oz) (02/13 1523)  Weight change:  Filed Weights   09/01/17 1627 09/01/17 2107 09/02/17 1523  Weight: 79.4 kg (175 lb) 87 kg (191 lb 12.8 oz) (P) 87 kg (191 lb 12.8 oz)    Intake/Output: I/O last 3 completed shifts: In: -  Out: 500 [Urine:500]   Intake/Output this shift:  Total I/O In: 240 [P.O.:240] Out: 350 [Urine:350]  Physical Exam: General: No acute distress  Head: Normocephalic, atraumatic. Moist oral mucosal membranes  Eyes: Anicteric  Neck: Supple, trachea midline  Lungs:  Clear to auscultation, normal effort  Heart: S1S2 no rubs  Abdomen:  Soft, nontender, bowel sounds present  Extremities: No peripheral edema.  Neurologic: Awake, alert, oriented to time/person/place  Skin: No lesions  Access: LUE AVF    Basic Metabolic Panel: Recent Labs  Lab 09/01/17 1632 09/02/17 0426  NA 137 142  K 2.9* 3.1*  CL 96* 107  CO2 27 22  GLUCOSE 112* 83  BUN 17 17  CREATININE 3.32* 3.47*  CALCIUM 10.1 9.0  MG  --  1.6*    Liver Function Tests: Recent Labs  Lab 09/01/17 1632 09/01/17 2144  AST 25 23  ALT 13* 11*  ALKPHOS 57 48  BILITOT 0.8 0.6  PROT 8.0 6.7  ALBUMIN 4.6 3.8   No results for input(s): LIPASE, AMYLASE in the last 168 hours. No results for input(s): AMMONIA in the last 168 hours.  CBC: Recent Labs  Lab 09/01/17 1632 09/02/17 0426  WBC 9.7 6.3   NEUTROABS 8.1*  --   HGB 12.9 11.5*  HCT 38.1 35.0  MCV 93.3 96.5  PLT 357 281    Cardiac Enzymes: Recent Labs  Lab 09/01/17 1632 09/02/17 0426  TROPONINI 0.04* 0.07*    BNP: Invalid input(s): POCBNP  CBG: No results for input(s): GLUCAP in the last 168 hours.  Microbiology: Results for orders placed or performed during the hospital encounter of 09/01/17  Culture, blood (routine x 2)     Status: None (Preliminary result)   Collection Time: 09/01/17  6:05 PM  Result Value Ref Range Status   Specimen Description BLOOD RIGHT HAND  Final   Special Requests   Final    BOTTLES DRAWN AEROBIC AND ANAEROBIC Blood Culture results may not be optimal due to an excessive volume of blood received in culture bottles   Culture   Final    NO GROWTH < 12 HOURS Performed at [facility], [address], [place], [place] [address]    Report Status PENDING  Incomplete  Culture, blood (routine x 2)     Status: None (Preliminary result)   Collection Time: 09/01/17  6:34 PM  Result Value Ref Range Status   Specimen Description BLOOD RAC  Final   Special Requests   Final    BOTTLES DRAWN AEROBIC AND ANAEROBIC Blood Culture adequate volume  Culture   Final    NO GROWTH < 12 HOURS Performed at [facility], [address] [place], [place] [address]    Report Status PENDING  Incomplete    Coagulation Studies: No results for input(s): LABPROT, INR in the last 72 hours.  Urinalysis: Recent Labs    09/01/17 1641  COLORURINE YELLOW*  LABSPEC 1.005  PHURINE 7.0  GLUCOSEU NEGATIVE  HGBUR NEGATIVE  BILIRUBINUR NEGATIVE  KETONESUR NEGATIVE  PROTEINUR NEGATIVE  NITRITE NEGATIVE  LEUKOCYTESUR SMALL*      Imaging: Ct Head Wo Contrast  Result Date: 09/01/2017 CLINICAL DATA:  Altered mental status EXAM: CT HEAD WITHOUT CONTRAST TECHNIQUE: Contiguous axial images were obtained from the base of the skull through the vertex without intravenous contrast. COMPARISON:   Head CT 12/17/2013 FINDINGS: Brain: No mass lesion, intraparenchymal hemorrhage or extra-axial collection. No evidence of acute cortical infarct. Brain parenchyma and CSF-containing spaces are normal for age. Vascular: No hyperdense vessel or unexpected calcification. Skull: Normal visualized skull base, calvarium and extracranial soft tissues. Sinuses/Orbits: No sinus fluid levels or advanced mucosal thickening. No mastoid effusion. Normal orbits. IMPRESSION: Normal aging brain. Electronically Signed   By: Ulyses Jarred M.D.   On: 09/01/2017 17:35   Mr Brain Wo Contrast  Result Date: 09/02/2017 CLINICAL DATA:  Altered level of consciousness. End-stage renal disease EXAM: MRI HEAD WITHOUT CONTRAST TECHNIQUE: Multiplanar, multiecho pulse sequences of the brain and surrounding structures were obtained without intravenous contrast. COMPARISON:  CT head 09/01/2017, MRI 12/18/2013 FINDINGS: Brain: Image quality degraded by mild motion Mild generalized atrophy. Negative for hydrocephalus. Negative for acute infarct. Mild changes in the white matter consistent with chronic ischemia. Brainstem and basal ganglia normal. Negative for hemorrhage or mass. Small bilateral subdural hygromas around both cerebral hemispheres and the cerebellum similar to the prior MRI. No midline shift. Vascular: Normal arterial flow voids Skull and upper cervical spine: Negative Sinuses/Orbits: Mild mucosal edema paranasal sinuses. Bilateral cataract removal Other: None IMPRESSION: No acute abnormality Mild atrophy and mild chronic microvascular ischemic change in the white matter. Electronically Signed   By: Franchot Gallo M.D.   On: 09/02/2017 13:28   Dg Chest Portable 1 View  Result Date: 09/01/2017 CLINICAL DATA:  Abnormal behavior. EXAM: PORTABLE CHEST 1 VIEW COMPARISON:  08/17/2017. FINDINGS: Limited exam due to patient being uncooperative. Low lung volumes with mild bibasilar atelectasis/infiltrates. No pleural effusion or  pneumothorax. Heart size stable. No acute bony abnormality. IMPRESSION: Limited exam due to the patient being uncooperative. Low lung volumes with mild bibasilar atelectasis/infiltrates. Electronically Signed   By: Soquel   On: 09/01/2017 17:17     Medications:   . sodium chloride    . cefTRIAXone (ROCEPHIN)  IV     . brimonidine  1 drop Both Eyes BID  . carbamazepine  200 mg Oral BID  . cholecalciferol  1,000 Units Oral Daily  . dicyclomine  20 mg Oral TID AC  . heparin  5,000 Units Subcutaneous Q8H  . midodrine  10 mg Oral Daily  . pantoprazole  40 mg Oral Daily  . pramipexole  0.125 mg Oral Q1400  . sodium chloride flush  3 mL Intravenous Q12H  . timolol  1 drop Both Eyes BID  . vitamin B-12  1,000 mcg Oral Daily   sodium chloride, acetaminophen **OR** acetaminophen, bisacodyl, ondansetron **OR** ondansetron (ZOFRAN) IV, sodium chloride flush  Assessment/ Plan:  72 y.o. female With diverticulosis, hypotension, bipolar disorder, polycystic kidney disease, ESRD on HD, anemia of CKD,  SHPTH   [facility]/MWF  1.  ESRD on HD MWF.  Patient is due for hemodialysis today.  Orders have been prepared.  Continue dialysis on MWF schedule.  2.  Anemia of CKD.  Hemoglobin currently 11.5.  Hold off on Epogen for now.  3.  Secondary hyperparathyroidism.  Check serum phosphorus today.  4.  Altered mental status.  Unclear as to what led to this.  She was felt to have a possible UTI.  She has been started on ceftriaxone for this.  Appreciate neurology input as well.  5.  Intradialytic hypotension.  Maintain the patient on midodrine.     LOS: 1 Ajaya Crutchfield 2/13/20194:05 PM

## 2017-09-02 NOTE — Progress Notes (Signed)
HD Tx started  

## 2017-09-02 NOTE — Plan of Care (Signed)
Pt alert and oriented today. Went for [employer] and eeg and had echo done. Pt in dialysis at this time

## 2017-09-03 LAB — BASIC METABOLIC PANEL
Anion gap: 13 (ref 5–15)
BUN: 12 mg/dL (ref 6–20)
CO2: 29 mmol/L (ref 22–32)
CREATININE: 3.02 mg/dL — AB (ref 0.44–1.00)
Calcium: 9 mg/dL (ref 8.9–10.3)
Chloride: 100 mmol/L — ABNORMAL LOW (ref 101–111)
GFR, EST AFRICAN AMERICAN: 17 mL/min — AB (ref >60)
GFR, EST NON AFRICAN AMERICAN: 15 mL/min — AB (ref >60)
Glucose, Bld: 148 mg/dL — ABNORMAL HIGH (ref 65–99)
POTASSIUM: 2.9 mmol/L — AB (ref 3.5–5.1)
SODIUM: 142 mmol/L (ref 135–145)

## 2017-09-03 LAB — TROPONIN I: TROPONIN I: 0.07 ng/mL — AB (ref <0.03)

## 2017-09-03 LAB — URINE CULTURE

## 2017-09-03 LAB — MAGNESIUM: MAGNESIUM: 1.6 mg/dL — AB (ref 1.7–2.4)

## 2017-09-03 MED ORDER — QUETIAPINE FUMARATE 25 MG PO TABS
25.0000 mg | ORAL_TABLET | Freq: Every day | ORAL | Status: DC
  Filled 2017-09-03: qty 1

## 2017-09-03 MED ORDER — MAGNESIUM SULFATE 2 GM/50ML IV SOLN
2.0000 g | Freq: Once | INTRAVENOUS | Status: AC
  Administered 2017-09-03: 2 g via INTRAVENOUS
  Filled 2017-09-03: qty 50

## 2017-09-03 MED ORDER — CEPHALEXIN 500 MG PO CAPS
500.0000 mg | ORAL_CAPSULE | Freq: Two times a day (BID) | ORAL | Status: DC
  Administered 2017-09-03: 500 mg via ORAL
  Filled 2017-09-03: qty 1

## 2017-09-03 MED ORDER — DICYCLOMINE HCL 20 MG PO TABS: 20.0000 mg | ORAL_TABLET | Freq: Three times a day (TID) | ORAL | 0 refills | Status: DC

## 2017-09-03 MED ORDER — POTASSIUM CHLORIDE CRYS ER 20 MEQ PO TBCR
60.0000 meq | EXTENDED_RELEASE_TABLET | Freq: Once | ORAL | Status: AC
  Administered 2017-09-03: 60 meq via ORAL
  Filled 2017-09-03: qty 3

## 2017-09-03 MED ORDER — CEPHALEXIN 500 MG PO CAPS: 500.0000 mg | ORAL_CAPSULE | Freq: Two times a day (BID) | ORAL | 0 refills | Status: DC

## 2017-09-03 NOTE — Progress Notes (Signed)
IV and tele were removed. Discharge instructions, follow-up appointments, and prescriptions were provided to the pt. All questions were answered. The pt is currently waiting on her sister to arrive for transport.

## 2017-09-03 NOTE — Progress Notes (Signed)
MD notified of elevated troponin and hypokalemia. New orders received.

## 2017-09-03 NOTE — Discharge Instructions (Signed)
Heart healthy diet

## 2017-09-03 NOTE — Progress Notes (Signed)
[facility]  ROUNDING NOTE   Subjective:  Patient feeling much better today. She appears to be back at her baseline mental status. She did complete hemodialysis yesterday.  Objective:  Vital signs in last 24 hours:  Temp:  [97.4 F (36.3 C)-98.2 F (36.8 C)] 97.5 F (36.4 C) (02/14 0510) Pulse Rate:  [60-77] 67 (02/14 0510) Resp:  [11-27] 18 (02/14 0510) BP: (137-164)/(48-69) 151/48 (02/14 0510) SpO2:  [97 %-100 %] 100 % (02/14 0510) Weight:  [87 kg (191 lb 12.8 oz)] 87 kg (191 lb 12.8 oz) (02/13 1523)  Weight change: 7.62 kg (16 lb 12.8 oz) Filed Weights   09/01/17 1627 09/01/17 2107 09/02/17 1523  Weight: 79.4 kg (175 lb) 87 kg (191 lb 12.8 oz) 87 kg (191 lb 12.8 oz)    Intake/Output: I/O last 3 completed shifts: In: 360 [P.O.:360] Out: 1913 [Urine:1350; Other:563]   Intake/Output this shift:  Total I/O In: 480 [P.O.:480] Out: -   Physical Exam: General: No acute distress  Head: Normocephalic, atraumatic. Moist oral mucosal membranes  Eyes: Anicteric  Neck: Supple, trachea midline  Lungs:  Clear to auscultation, normal effort  Heart: S1S2 no rubs  Abdomen:  Soft, nontender, bowel sounds present  Extremities: No peripheral edema.  Neurologic: Awake, alert, oriented to time/person/place  Skin: No lesions  Access: LUE AVF    Basic Metabolic Panel: Recent Labs  Lab 09/01/17 1632 09/02/17 0426 09/03/17 0937  NA 137 142 142  K 2.9* 3.1* 2.9*  CL 96* 107 100*  CO2 27 22 29   GLUCOSE 112* 83 148*  BUN 17 17 12   CREATININE 3.32* 3.47* 3.02*  CALCIUM 10.1 9.0 9.0  MG  --  1.6* 1.6*    Liver Function Tests: Recent Labs  Lab 09/01/17 1632 09/01/17 2144  AST 25 23  ALT 13* 11*  ALKPHOS 57 48  BILITOT 0.8 0.6  PROT 8.0 6.7  ALBUMIN 4.6 3.8   No results for input(s): LIPASE, AMYLASE in the last 168 hours. No results for input(s): AMMONIA in the last 168 hours.  CBC: Recent Labs  Lab 09/01/17 1632 09/02/17 0426  WBC 9.7 6.3   NEUTROABS 8.1*  --   HGB 12.9 11.5*  HCT 38.1 35.0  MCV 93.3 96.5  PLT 357 281    Cardiac Enzymes: Recent Labs  Lab 09/01/17 1632 09/02/17 0426 09/03/17 0937  TROPONINI 0.04* 0.07* 0.07*    BNP: Invalid input(s): POCBNP  CBG: No results for input(s): GLUCAP in the last 168 hours.  Microbiology: Results for orders placed or performed during the hospital encounter of 09/01/17  Urine culture     Status: Abnormal   Collection Time: 09/01/17  4:32 PM  Result Value Ref Range Status   Specimen Description   Final    URINE, RANDOM Performed at [facility], [address] [place], [place] [address]    Special Requests   Final    NONE Performed at [facility], [address] [place], [place] [address]    Culture MULTIPLE SPECIES PRESENT, SUGGEST RECOLLECTION (A)  Final   Report Status 09/03/2017 FINAL  Final  Culture, blood (routine x 2)     Status: None (Preliminary result)   Collection Time: 09/01/17  6:05 PM  Result Value Ref Range Status   Specimen Description BLOOD RIGHT HAND  Final   Special Requests   Final    BOTTLES DRAWN AEROBIC AND ANAEROBIC Blood Culture results may not be optimal due to an excessive volume of blood  received in culture bottles   Culture   Final    NO GROWTH 2 DAYS Performed at [facility], [address] [place], [place] [address]    Report Status PENDING  Incomplete  Culture, blood (routine x 2)     Status: None (Preliminary result)   Collection Time: 09/01/17  6:34 PM  Result Value Ref Range Status   Specimen Description BLOOD RAC  Final   Special Requests   Final    BOTTLES DRAWN AEROBIC AND ANAEROBIC Blood Culture adequate volume   Culture   Final    NO GROWTH 2 DAYS Performed at [facility], [address] [place], [place] [address]    Report Status PENDING  Incomplete    Coagulation Studies: No results for input(s): LABPROT, INR in the last 72 hours.  Urinalysis: Recent Labs     09/01/17 1641  COLORURINE YELLOW*  LABSPEC 1.005  PHURINE 7.0  GLUCOSEU NEGATIVE  HGBUR NEGATIVE  BILIRUBINUR NEGATIVE  KETONESUR NEGATIVE  PROTEINUR NEGATIVE  NITRITE NEGATIVE  LEUKOCYTESUR SMALL*      Imaging: Ct Head Wo Contrast  Result Date: 09/01/2017 CLINICAL DATA:  Altered mental status EXAM: CT HEAD WITHOUT CONTRAST TECHNIQUE: Contiguous axial images were obtained from the base of the skull through the vertex without intravenous contrast. COMPARISON:  Head CT 12/17/2013 FINDINGS: Brain: No mass lesion, intraparenchymal hemorrhage or extra-axial collection. No evidence of acute cortical infarct. Brain parenchyma and CSF-containing spaces are normal for age. Vascular: No hyperdense vessel or unexpected calcification. Skull: Normal visualized skull base, calvarium and extracranial soft tissues. Sinuses/Orbits: No sinus fluid levels or advanced mucosal thickening. No mastoid effusion. Normal orbits. IMPRESSION: Normal aging brain. Electronically Signed   By: Ulyses Jarred M.D.   On: 09/01/2017 17:35   Mr Brain Wo Contrast  Result Date: 09/02/2017 CLINICAL DATA:  Altered level of consciousness. End-stage renal disease EXAM: MRI HEAD WITHOUT CONTRAST TECHNIQUE: Multiplanar, multiecho pulse sequences of the brain and surrounding structures were obtained without intravenous contrast. COMPARISON:  CT head 09/01/2017, MRI 12/18/2013 FINDINGS: Brain: Image quality degraded by mild motion Mild generalized atrophy. Negative for hydrocephalus. Negative for acute infarct. Mild changes in the white matter consistent with chronic ischemia. Brainstem and basal ganglia normal. Negative for hemorrhage or mass. Small bilateral subdural hygromas around both cerebral hemispheres and the cerebellum similar to the prior MRI. No midline shift. Vascular: Normal arterial flow voids Skull and upper cervical spine: Negative Sinuses/Orbits: Mild mucosal edema paranasal sinuses. Bilateral cataract removal Other: None  IMPRESSION: No acute abnormality Mild atrophy and mild chronic microvascular ischemic change in the white matter. Electronically Signed   By: Franchot Gallo M.D.   On: 09/02/2017 13:28   Dg Chest Portable 1 View  Result Date: 09/01/2017 CLINICAL DATA:  Abnormal behavior. EXAM: PORTABLE CHEST 1 VIEW COMPARISON:  08/17/2017. FINDINGS: Limited exam due to patient being uncooperative. Low lung volumes with mild bibasilar atelectasis/infiltrates. No pleural effusion or pneumothorax. Heart size stable. No acute bony abnormality. IMPRESSION: Limited exam due to the patient being uncooperative. Low lung volumes with mild bibasilar atelectasis/infiltrates. Electronically Signed   By: Athalia   On: 09/01/2017 17:17     Medications:   . sodium chloride    . magnesium sulfate 1 - 4 g bolus IVPB 2 g (09/03/17 1131)   . brimonidine  1 drop Both Eyes BID  . carbamazepine  200 mg Oral BID  . cephALEXin  500 mg Oral Q12H  . cholecalciferol  1,000 Units Oral  Daily  . dicyclomine  20 mg Oral TID AC  . heparin  5,000 Units Subcutaneous Q8H  . midodrine  10 mg Oral Daily  . pantoprazole  40 mg Oral Daily  . pramipexole  0.125 mg Oral Q1400  . QUEtiapine  25 mg Oral QHS  . sodium chloride flush  3 mL Intravenous Q12H  . timolol  1 drop Both Eyes BID  . vitamin B-12  1,000 mcg Oral Daily   sodium chloride, acetaminophen **OR** acetaminophen, bisacodyl, ondansetron **OR** ondansetron (ZOFRAN) IV, sodium chloride flush  Assessment/ Plan:  72 y.o. female With diverticulosis, hypotension, bipolar disorder, polycystic kidney disease, ESRD on HD, anemia of CKD, SHPTH   [facility]/MWF  1.  ESRD on HD MWF.  No acute indication for dialysis today.  We will plan for dialysis again tomorrow as an outpatient.  2.  Anemia of CKD.  Continue to monitor hemoglobin as an outpatient.  3.  Secondary hyperparathyroidism.  Continue to monitor PTH, phosphorus, and calcium levels as an outpatient.  4.  Altered  mental status.  Appears to be back completely to her baseline mental status.  She is awake, alert, and oriented to time person place.  She is conversant today.  5.  Intradialytic hypotension.  Maintain the patient on midodrine.     LOS: 2 Anaston Koehn 2/14/201911:33 AM

## 2017-09-03 NOTE — Care Management (Signed)
Amanda Morris dialysis liaison notified of discharge  

## 2017-09-03 NOTE — Discharge Summary (Signed)
[facility] NAME: Joyce [name]    MR#:  [id number]  DATE OF BIRTH:  [birth year]  DATE OF ADMISSION:  09/01/2017   ADMITTING PHYSICIAN: Dustin Flock, MD  DATE OF DISCHARGE: 09/03/2017  PRIMARY CARE PHYSICIAN: Cletis [place], MD   ADMISSION DIAGNOSIS:  Elevated troponin [R74.8] Urinary tract infection without hematuria, site unspecified [N39.0] Altered mental status, unspecified altered mental status type [R41.82] DISCHARGE DIAGNOSIS:  Principal Problem:   Bipolar I disorder, most recent episode (or current) manic ([place]) Active Problems:   Acute encephalopathy   Altered mental status   Acute delirium  SECONDARY DIAGNOSIS:   Past Medical History:  Diagnosis Date  . Anal fissure   . Bipolar affective disorder ([place])   . CKD (chronic kidney disease)    Dr Holley Raring [name] 4  . Colon polyps   . Diverticulitis   . Diverticulitis   . Family history of adverse reaction to anesthesia    mom - PONV  . GERD (gastroesophageal reflux disease)   . Headache    migraines - none over 10 yrs  . Heart murmur   . History of hiatal hernia   . Hypertension   . Pancreatitis    Valproic acid  . Vertigo    HOSPITAL COURSE:   1.Acute encephalopathy etiology unclear Hold her pain medications Neurology consult: per Dr. Doy Mince, MRI brain. No acute abnormality per MRI. Per Dr. Weber Cooks, it sounds like the symptoms were probably a manifestation of her bipolar disorder that had been exacerbated by poor sleep for a couple of days, possibly some mistakes with her medicine, possibly some emotional stress and may be a contribution from her chronic medical problems.  Back to her baseline.  2.End-stage renal disease: Hemodialysis yesterday.  3.Mild UTI treat with ceftriaxone,  changed to Keflex.  4.History of bipolar disorder psych eval for hold Seroquel for now  5.Elevated troponin likely due to demand ischemia follow troponin  levels allergy to indomethacin will hold aspirin for now  Echocardiogram: EF: 55% -   60% unremarkable.  6.GERD, on Protonix  Hypokalemia and hypomagnesemia.  given supplement today.  Discussed with Dr. Doy Mince.  DISCHARGE CONDITIONS:  Stable, discharge to home today CONSULTS OBTAINED:  Treatment Team:  Alexis Goodell, MD [place], [name], MD Anthonette Legato, MD DRUG ALLERGIES:   Allergies  Allergen Reactions  . Morphine And Related Shortness Of Breath    Pt reports chest pain and difficulty breathing.  . Indomethacin Hives  . Pollen Extract Other (See Comments)    Sinus problems and HA   DISCHARGE MEDICATIONS:   Allergies as of 09/03/2017      Reactions   Morphine And Related Shortness Of Breath   Pt reports chest pain and difficulty breathing.   Indomethacin Hives   Pollen Extract Other (See Comments)   Sinus problems and HA      Medication List    STOP taking these medications   oxyCODONE-acetaminophen 5-325 MG tablet Commonly known as:  PERCOCET/ROXICET   traMADol 50 MG tablet Commonly known as:  ULTRAM     TAKE these medications   acetaminophen 325 MG tablet Commonly known as:  TYLENOL Take 325 mg 2 (two) times daily as needed by mouth for moderate pain or headache.   bisacodyl 5 MG EC tablet Commonly known as:  DULCOLAX Take 1 tablet (5 mg total) by mouth daily as needed for moderate constipation.   brimonidine 0.2 % ophthalmic solution Commonly known as:  ALPHAGAN  Place 2 (two) times daily into both eyes.   carbamazepine 200 MG tablet Commonly known as:  TEGRETOL Take 200 mg by mouth 2 (two) times daily.   cephALEXin 500 MG capsule Commonly known as:  KEFLEX Take 1 capsule (500 mg total) by mouth every 12 (twelve) hours.   cholecalciferol 1000 units tablet Commonly known as:  VITAMIN D Take 1,000 Units by mouth daily.   cyanocobalamin 1000 MCG/ML injection Commonly known as:  (VITAMIN B-12) Inject 1,000 mcg into the muscle every  30 (thirty) days.   dicyclomine 20 MG tablet Commonly known as:  BENTYL Take 1 tablet (20 mg total) by mouth 3 (three) times daily before meals.   lidocaine-prilocaine cream Commonly known as:  EMLA Apply 1 application every Monday, Wednesday, and Friday topically. At dialysis   midodrine 10 MG tablet Commonly known as:  PROAMATINE Take 10 mg by mouth daily.   pantoprazole 40 MG tablet Commonly known as:  PROTONIX TAKE 1 TABLET EVERY DAY   pramipexole 0.125 MG tablet Commonly known as:  MIRAPEX Take 0.125 mg by mouth daily at 2 PM.   QUEtiapine 25 MG tablet Commonly known as:  SEROQUEL Take 50 mg by mouth at bedtime.   timolol 0.5 % ophthalmic solution Commonly known as:  TIMOPTIC Place 1 drop 2 (two) times daily into both eyes.        DISCHARGE INSTRUCTIONS:  See AVS.  If you experience worsening of your admission symptoms, develop shortness of breath, life threatening emergency, suicidal or homicidal thoughts you must seek medical attention immediately by calling 911 or calling your MD immediately  if symptoms less severe.  You Must read complete instructions/literature along with all the possible adverse reactions/side effects for all the Medicines you take and that have been prescribed to you. Take any new Medicines after you have completely understood and accpet all the possible adverse reactions/side effects.   Please note  You were cared for by a hospitalist during your hospital stay. If you have any questions about your discharge medications or the care you received while you were in the hospital after you are discharged, you can call the unit and asked to speak with the hospitalist on call if the hospitalist that took care of you is not available. Once you are discharged, your primary care physician will handle any further medical issues. Please note that NO REFILLS for any discharge medications will be authorized once you are discharged, as it is imperative that  you return to your primary care physician (or establish a relationship with a primary care physician if you do not have one) for your aftercare needs so that they can reassess your need for medications and monitor your lab values.    On the day of Discharge:  VITAL SIGNS:  Blood pressure (!) 151/48, pulse 67, temperature (!) 97.5 F (36.4 C), temperature source Oral, resp. rate 18, height 5\' 3"  (1.6 m), weight 191 lb 12.8 oz (87 kg), SpO2 100 %. PHYSICAL EXAMINATION:  GENERAL:  72 y.o.-year-old patient lying in the bed with no acute distress.  EYES: Pupils equal, round, reactive to light and accommodation. No scleral icterus. Extraocular muscles intact.  HEENT: Head atraumatic, normocephalic. Oropharynx and nasopharynx clear.  NECK:  Supple, no jugular venous distention. No thyroid enlargement, no tenderness.  LUNGS: Normal breath sounds bilaterally, no wheezing, rales,rhonchi or crepitation. No use of accessory muscles of respiration.  CARDIOVASCULAR: S1, S2 normal. No murmurs, rubs, or gallops.  ABDOMEN: Soft, non-tender, non-distended. Bowel sounds  present. No organomegaly or mass.  EXTREMITIES: No pedal edema, cyanosis, or clubbing.  NEUROLOGIC: Cranial nerves II through XII are intact. Muscle strength 5/5 in all extremities. Sensation intact. Gait not checked.  PSYCHIATRIC: The patient is alert and oriented x 3.  SKIN: No obvious rash, lesion, or ulcer.  DATA REVIEW:   CBC Recent Labs  Lab 09/02/17 0426  WBC 6.3  HGB 11.5*  HCT 35.0  PLT 281    Chemistries  Recent Labs  Lab 09/01/17 2144  09/03/17 0937  NA  --    < > 142  K  --    < > 2.9*  CL  --    < > 100*  CO2  --    < > 29  GLUCOSE  --    < > 148*  BUN  --    < > 12  CREATININE  --    < > 3.02*  CALCIUM  --    < > 9.0  MG  --    < > 1.6*  AST 23  --   --   ALT 11*  --   --   ALKPHOS 48  --   --   BILITOT 0.6  --   --    < > = values in this interval not displayed.     Microbiology Results  Results for  orders placed or performed during the hospital encounter of 09/01/17  Urine culture     Status: Abnormal   Collection Time: 09/01/17  4:32 PM  Result Value Ref Range Status   Specimen Description   Final    URINE, RANDOM Performed at [facility], [address] [place], [place] [address]    Special Requests   Final    NONE Performed at [facility], [address] [place], [place] [address]    Culture MULTIPLE SPECIES PRESENT, SUGGEST RECOLLECTION (A)  Final   Report Status 09/03/2017 FINAL  Final  Culture, blood (routine x 2)     Status: None (Preliminary result)   Collection Time: 09/01/17  6:05 PM  Result Value Ref Range Status   Specimen Description BLOOD RIGHT HAND  Final   Special Requests   Final    BOTTLES DRAWN AEROBIC AND ANAEROBIC Blood Culture results may not be optimal due to an excessive volume of blood received in culture bottles   Culture   Final    NO GROWTH 2 DAYS Performed at [facility], [address] [place], [place] [address]    Report Status PENDING  Incomplete  Culture, blood (routine x 2)     Status: None (Preliminary result)   Collection Time: 09/01/17  6:34 PM  Result Value Ref Range Status   Specimen Description BLOOD RAC  Final   Special Requests   Final    BOTTLES DRAWN AEROBIC AND ANAEROBIC Blood Culture adequate volume   Culture   Final    NO GROWTH 2 DAYS Performed at [facility], [address], [place], [place] [address]    Report Status PENDING  Incomplete    RADIOLOGY:  No results found.   Management plans discussed with the patient, family and they are in agreement.  CODE STATUS: Full Code   TOTAL TIME TAKING CARE OF THIS PATIENT: 32 minutes.    Demetrios Loll M.D on 09/03/2017 at 1:53 PM  Between 7am to 6pm - Pager - [phone]  After 6pm go to [email or web address] - [employer] attorney  Hospitalists  Office  [phone]  CC: Primary care physician; Cletis Athens, MD   Note: This dictation was prepared with Dragon dictation along with smaller phrase technology. Any transcriptional errors that result from this process are unintentional.

## 2017-09-03 NOTE — Care Management Important Message (Signed)
Important Message  Patient Details  Name: Joyce [name] MRN: [id number] Date of Birth: March 08, 1946   Medicare Important Message Given:  N/A - LOS <3 / Initial given by admissions    Beverly Sessions, RN 09/03/2017, 10:45 AM

## 2017-09-04 DIAGNOSIS — N186 End stage renal disease: Secondary | ICD-10-CM | POA: Diagnosis not present

## 2017-09-04 DIAGNOSIS — F3177 Bipolar disorder, in partial remission, most recent episode mixed: Secondary | ICD-10-CM | POA: Diagnosis not present

## 2017-09-04 DIAGNOSIS — Z992 Dependence on renal dialysis: Secondary | ICD-10-CM | POA: Diagnosis not present

## 2017-09-06 LAB — CULTURE, BLOOD (ROUTINE X 2)
CULTURE: NO GROWTH
Culture: NO GROWTH
SPECIAL REQUESTS: ADEQUATE

## 2017-09-07 DIAGNOSIS — Z992 Dependence on renal dialysis: Secondary | ICD-10-CM | POA: Diagnosis not present

## 2017-09-07 DIAGNOSIS — N186 End stage renal disease: Secondary | ICD-10-CM | POA: Diagnosis not present

## 2017-09-09 ENCOUNTER — Other Ambulatory Visit: Payer: Self-pay

## 2017-09-09 DIAGNOSIS — Z992 Dependence on renal dialysis: Secondary | ICD-10-CM | POA: Diagnosis not present

## 2017-09-09 DIAGNOSIS — N186 End stage renal disease: Secondary | ICD-10-CM | POA: Diagnosis not present

## 2017-09-09 DIAGNOSIS — F3177 Bipolar disorder, in partial remission, most recent episode mixed: Secondary | ICD-10-CM | POA: Diagnosis not present

## 2017-09-09 NOTE — Patient Outreach (Signed)
[facility]) Care Management  09/09/2017  Joyce [name] [facility] 1945/10/11 [id number]   [employer] Referral received 09/08/16: discharged from an inpatient admission from [facility] on 09/03/17. Please outreach and determine if there are any needs for transition.  No answer. HIPPA compliant message.  Plan: follow up call next business day if no return call.  Thea Silversmith, RN, MSN, [place] Coordinator Cell: [phone]

## 2017-09-10 ENCOUNTER — Other Ambulatory Visit: Payer: Self-pay

## 2017-09-10 DIAGNOSIS — H401132 Primary open-angle glaucoma, bilateral, moderate stage: Secondary | ICD-10-CM | POA: Diagnosis not present

## 2017-09-10 NOTE — Patient Outreach (Signed)
[facility]) Care Management  09/10/2017  Joyce [name] [facility] May 04, 1946 [id number]  [employer] Referral received 09/08/16: discharged from an inpatient admission from [facility] on 09/03/17. 2 Emergency Room visits in 6 months and one admission within 6 months.  Subjective: client reports being admitted because "I just couldn't get it together and also I had a UTI".  Objective: none-telephonic  Assessment: 72 year old recently discharged from [facility] on 09/03/17. Client admitted with altered mental status, UTI. History of ESRD/HD, HTN, acid reflux, Bipolar.  RNCM called to complete transition of care call and discuss [place] Management services. Client acknowledges altered mental status upon admission, but denies any confusion at this time. Discharge instructions reviewed. Medications reviewed.   Client's primary concern is the fact that she is unable to work at this time and disability. Client is receptive to social work referral for [employer] needs.  Plan: community care coordinator for transition of care program, social work for assistance with community resources.  Thea Silversmith, RN, MSN, [facility] [place] Coordinator Cell: [phone]  Social work referral, community care coordinator

## 2017-09-11 ENCOUNTER — Other Ambulatory Visit: Payer: Self-pay | Admitting: *Deleted

## 2017-09-11 DIAGNOSIS — N186 End stage renal disease: Secondary | ICD-10-CM | POA: Diagnosis not present

## 2017-09-11 DIAGNOSIS — Z992 Dependence on renal dialysis: Secondary | ICD-10-CM | POA: Diagnosis not present

## 2017-09-11 NOTE — Patient Outreach (Signed)
[facility]) Care Management  09/11/2017  Shaneka Efaw [facility] 06/17/1946 [id number]   Patient referred to this social worker to review community resources. Patient disabled and not able to work. Patient not available at the time of call. Voicemail message left to return call.   Sheralyn Boatman [facility] Care Management [phone]

## 2017-09-14 ENCOUNTER — Other Ambulatory Visit: Payer: Self-pay | Admitting: *Deleted

## 2017-09-14 ENCOUNTER — Ambulatory Visit: Payer: Self-pay | Admitting: *Deleted

## 2017-09-14 DIAGNOSIS — N186 End stage renal disease: Secondary | ICD-10-CM | POA: Diagnosis not present

## 2017-09-14 DIAGNOSIS — Z992 Dependence on renal dialysis: Secondary | ICD-10-CM | POA: Diagnosis not present

## 2017-09-14 DIAGNOSIS — F3177 Bipolar disorder, in partial remission, most recent episode mixed: Secondary | ICD-10-CM | POA: Diagnosis not present

## 2017-09-14 NOTE — Patient Outreach (Signed)
Unsuccessful telephone encounters x 2 to Sanjuan Dame, 72 year old female- follow up on referral received 09/11/17 from [place] for Community CM services/ongoing transition of care/recent hospitalization February 12-14,2019 for UTI, altered mental status.   Pt's history  Includes but not limited to ESRD, Hypertension, Acid reflux, Bipolar.   HIPAA compliant  Voice message left on both pt's home and mobile numbers with RN CM's contact information.    Plan:  If no response to voice messages left, plan to follow up again tomorrow.    Zara Chess.   [facility] Care Management  [phone]

## 2017-09-15 ENCOUNTER — Other Ambulatory Visit: Payer: Self-pay | Admitting: *Deleted

## 2017-09-15 ENCOUNTER — Encounter: Payer: Self-pay | Admitting: *Deleted

## 2017-09-15 DIAGNOSIS — L570 Actinic keratosis: Secondary | ICD-10-CM | POA: Diagnosis not present

## 2017-09-15 DIAGNOSIS — M545 Low back pain: Secondary | ICD-10-CM | POA: Diagnosis not present

## 2017-09-15 DIAGNOSIS — R631 Polydipsia: Secondary | ICD-10-CM | POA: Diagnosis not present

## 2017-09-15 DIAGNOSIS — E669 Obesity, unspecified: Secondary | ICD-10-CM | POA: Diagnosis not present

## 2017-09-15 NOTE — Patient Outreach (Signed)
Second unsuccessful telephone encounter to Sanjuan Dame, 72 year old female - follow up on referral from [place] CM for Community CM services, ongoing transition of care/recent hospitalization  February 12-14,2019 for UTI, altered mental status.  HIPAA compliant voice message left with RN CM's  Contact information.    Plan:  If no response to voice message left, plan to send unable to contact letter.   Zara Chess.   [facility] Care Management  [phone]

## 2017-09-16 ENCOUNTER — Other Ambulatory Visit: Payer: Self-pay

## 2017-09-16 ENCOUNTER — Other Ambulatory Visit: Payer: Self-pay | Admitting: Licensed Clinical Social Worker

## 2017-09-16 ENCOUNTER — Emergency Department
Admission: EM | Admit: 2017-09-16 | Discharge: 2017-09-17 | Disposition: A | Discharge status: Other Acute Inpatient Hospital | Payer: Medicare HMO | Attending: Student in an Organized Health Care Education/Training Program | Admitting: Student in an Organized Health Care Education/Training Program

## 2017-09-16 DIAGNOSIS — Z79899 Other long term (current) drug therapy: Secondary | ICD-10-CM | POA: Insufficient documentation

## 2017-09-16 DIAGNOSIS — I120 Hypertensive chronic kidney disease with stage 5 chronic kidney disease or end stage renal disease: Secondary | ICD-10-CM | POA: Insufficient documentation

## 2017-09-16 DIAGNOSIS — F319 Bipolar disorder, unspecified: Secondary | ICD-10-CM | POA: Diagnosis present

## 2017-09-16 DIAGNOSIS — F22 Delusional disorders: Secondary | ICD-10-CM | POA: Insufficient documentation

## 2017-09-16 DIAGNOSIS — Z992 Dependence on renal dialysis: Secondary | ICD-10-CM | POA: Insufficient documentation

## 2017-09-16 DIAGNOSIS — N186 End stage renal disease: Secondary | ICD-10-CM

## 2017-09-16 DIAGNOSIS — F3110 Bipolar disorder, current episode manic without psychotic features, unspecified: Secondary | ICD-10-CM | POA: Diagnosis present

## 2017-09-16 DIAGNOSIS — N189 Chronic kidney disease, unspecified: Secondary | ICD-10-CM | POA: Diagnosis present

## 2017-09-16 LAB — CBC
HEMATOCRIT: 36.9 % (ref 35.0–47.0)
HEMOGLOBIN: 12.4 g/dL (ref 12.0–16.0)
MCH: 32.1 pg (ref 26.0–34.0)
MCHC: 33.7 g/dL (ref 32.0–36.0)
MCV: 95.4 fL (ref 80.0–100.0)
Platelets: 305 10*3/uL (ref 150–440)
RBC: 3.86 MIL/uL (ref 3.80–5.20)
RDW: 15.6 % — AB (ref 11.5–14.5)
WBC: 7.7 10*3/uL (ref 3.6–11.0)

## 2017-09-16 LAB — URINALYSIS, COMPLETE (UACMP) WITH MICROSCOPIC
Bilirubin Urine: NEGATIVE
GLUCOSE, UA: NEGATIVE mg/dL
Hgb urine dipstick: NEGATIVE
KETONES UR: NEGATIVE mg/dL
Nitrite: NEGATIVE
PH: 7 (ref 5.0–8.0)
PROTEIN: NEGATIVE mg/dL
Specific Gravity, Urine: 1.006 (ref 1.005–1.030)

## 2017-09-16 LAB — COMPREHENSIVE METABOLIC PANEL
ALBUMIN: 4.5 g/dL (ref 3.5–5.0)
ALK PHOS: 58 U/L (ref 38–126)
ALT: 12 U/L — ABNORMAL LOW (ref 14–54)
ANION GAP: 14 (ref 5–15)
AST: 22 U/L (ref 15–41)
BILIRUBIN TOTAL: 0.8 mg/dL (ref 0.3–1.2)
BUN: 26 mg/dL — ABNORMAL HIGH (ref 6–20)
CALCIUM: 9.8 mg/dL (ref 8.9–10.3)
CO2: 25 mmol/L (ref 22–32)
Chloride: 100 mmol/L — ABNORMAL LOW (ref 101–111)
Creatinine, Ser: 4.72 mg/dL — ABNORMAL HIGH (ref 0.44–1.00)
GFR calc Af Amer: 10 mL/min — ABNORMAL LOW (ref >60)
GFR calc non Af Amer: 9 mL/min — ABNORMAL LOW (ref >60)
GLUCOSE: 115 mg/dL — AB (ref 65–99)
POTASSIUM: 2.8 mmol/L — AB (ref 3.5–5.1)
Sodium: 139 mmol/L (ref 135–145)
TOTAL PROTEIN: 7.7 g/dL (ref 6.5–8.1)

## 2017-09-16 MED ORDER — CARBAMAZEPINE 200 MG PO TABS
200.0000 mg | ORAL_TABLET | Freq: Once | ORAL | Status: AC
  Administered 2017-09-16: 200 mg via ORAL
  Filled 2017-09-16: qty 1

## 2017-09-16 MED ORDER — FOSFOMYCIN TROMETHAMINE 3 G PO PACK
3.0000 g | PACK | Freq: Once | ORAL | Status: AC
  Administered 2017-09-16: 3 g via ORAL
  Filled 2017-09-16: qty 3

## 2017-09-16 MED ORDER — QUETIAPINE FUMARATE 25 MG PO TABS
50.0000 mg | ORAL_TABLET | Freq: Every day | ORAL | Status: DC
  Administered 2017-09-16: 50 mg via ORAL
  Filled 2017-09-16: qty 2

## 2017-09-16 NOTE — Patient Outreach (Signed)
[facility]) Care Management  09/16/2017  Joyce [name] [facility] 01-Feb-1946 [id number]  Assessment-CSW providing coverage for [place]. CSW completed second outreach attempt today. CSW unable to reach patient successfully. CSW left a HIPPA compliant voice message encouraging patient to return call once available.  Plan-CSW will mail unsuccessful outreach letter to patient at this time. CSW will update CSW [employer] who will complete third and final outreach attempt.   Eula Fried, BSW, MSW, [email or web address] Phone: [phone] Fax: [phone]

## 2017-09-16 NOTE — ED Notes (Signed)
Gave pt food tray. 

## 2017-09-16 NOTE — ED Notes (Signed)
Patient placed in front of the lobby next to first RN to be observed until triage.

## 2017-09-16 NOTE — ED Notes (Addendum)
Pt sister states that pt is bipolar. Pt refused to go to dialysis today. Sister states that pt is paranoid, obsessive, agitated, and stated that she wanted "help" today. Sister is at bedside. Pt is alert and oriented x 4.

## 2017-09-16 NOTE — ED Triage Notes (Signed)
Pt brought in by family, they states she needs to see psychiatrist due to her bipolar disorder. Refused to go to dialysis, has been paranoid, runs out of the house and is needed supervision. Pt not answering question, family states she is refusing to talk. Pt missed her dialysis today, did have it Monday.

## 2017-09-16 NOTE — ED Provider Notes (Signed)
[facility] Emergency Department Provider Note    None    (approximate)  I have reviewed the triage vital signs and the nursing notes.   HISTORY  Chief Complaint Psychiatric Evaluation   HPI Joyce [name] is a 72 y.o. female with a history of CKD on Monday Wednesday Friday dialysis as well as a history of bipolar disorder with recent admissions to the hospital for psychiatric management presents due to worsening paranoia and anhedonia.  States that she is been forgetful with her medications and forgot to get dialysis today.  Patient accompanied by her sister who states that she is acting like her bipolar is flaring up.  Was recently increased on her Tegretol without any improvement in symptoms this week.  No report of SI or HI but sister states that she is "horribly paranoid."She denies any nausea vomiting chest pain or shortness of breath.  Past Medical History:  Diagnosis Date  . Anal fissure   . Bipolar affective disorder ([place])   . CKD (chronic kidney disease)    Dr Holley Raring [name] 4  . Colon polyps   . Diverticulitis   . Diverticulitis   . Family history of adverse reaction to anesthesia    mom - PONV  . GERD (gastroesophageal reflux disease)   . Headache    migraines - none over 10 yrs  . Heart murmur   . History of hiatal hernia   . Hypertension   . Pancreatitis    Valproic acid  . Vertigo    Family History  Problem Relation Age of Onset  . Stroke Father   . Hypertension Father   . Breast cancer Other   . Colon cancer Neg Hx   . Liver disease Neg Hx    Past Surgical History:  Procedure Laterality Date  . A/V FISTULAGRAM Left 04/14/2017   Procedure: A/V Fistulagram;  Surgeon: Katha Cabal, MD;  Location: [place] [facility];  Service: Cardiovascular;  Laterality: Left;  . A/V FISTULAGRAM Left 06/09/2017   Procedure: A/V FISTULAGRAM;  Surgeon: Katha Cabal, MD;  Location: [place] [facility];  Service: Cardiovascular;   Laterality: Left;  . ABDOMINAL HYSTERECTOMY  1990 ?  . AV FISTULA PLACEMENT  4/30  . BREAST EXCISIONAL BIOPSY Left 1994   neg surgical bx  . CHOLECYSTECTOMY  2003  . COLONOSCOPY  2014   Dr. Jamal Collin  . COLONOSCOPY WITH PROPOFOL N/A 09/24/2015   Procedure: COLONOSCOPY WITH random colon byopies.;  Surgeon: Lucilla Lame, MD;  Location: [place];  Service: Endoscopy;  Laterality: N/A;  . ESOPHAGOGASTRODUODENOSCOPY (EGD) WITH PROPOFOL N/A 09/24/2015   Procedure: ESOPHAGOGASTRODUODENOSCOPY (EGD) ;  Surgeon: Lucilla Lame, MD;  Location: [place];  Service: Endoscopy;  Laterality: N/A;  . EYE SURGERY    . PERIPHERAL VASCULAR CATHETERIZATION N/A 05/29/2015   Procedure: A/V Shuntogram/Fistulagram;  Surgeon: Katha Cabal, MD;  Location: [place] [facility];  Service: Cardiovascular;  Laterality: N/A;  . PERIPHERAL VASCULAR CATHETERIZATION N/A 05/29/2015   Procedure: A/V Shunt Intervention;  Surgeon: Katha Cabal, MD;  Location: [place] [facility];  Service: Cardiovascular;  Laterality: N/A;  . POLYPECTOMY  09/24/2015   Procedure: POLYPECTOMY INTESTINAL;  Surgeon: Lucilla Lame, MD;  Location: [place];  Service: Endoscopy;;  cecal polyp ascending polyp   Patient Active Problem List   Diagnosis Date Noted  . Bipolar I disorder, most recent episode (or current) manic ([place]) 09/02/2017  . Acute delirium 09/02/2017  . Altered mental status   .  Acute encephalopathy 09/01/2017  . ESRD on dialysis ([place]) 04/06/2017  . Complication of vascular access for dialysis 04/06/2017  . Hematuria 12/18/2016  . Polycystic kidney 10/12/2016  . Ruptured cyst of kidney 10/03/2016  . HTN (hypertension) 10/01/2016  . Chronic kidney disease 10/01/2016  . Anemia associated with chronic renal failure 09/24/2016  . Acute kidney insufficiency 07/28/2016  . Noninfectious diarrhea   . Benign neoplasm of cecum   . Benign neoplasm of ascending colon   . Diarrhea   . Nausea   . Gastric  polyp   . Chronic constipation 12/27/2014  . Nausea with vomiting 12/27/2014  . Left sided abdominal pain 12/27/2014  . Personal history of colonic polyps 05/10/2013      Prior to Admission medications   Medication Sig Start Date End Date Taking? Authorizing Provider  acetaminophen (TYLENOL) 325 MG tablet Take 325 mg 2 (two) times daily as needed by mouth for moderate pain or headache.    Yes [provider]  ARIPiprazole (ABILIFY) 2 MG tablet Take 2 mg by mouth daily.   Yes [provider]  brimonidine (ALPHAGAN) 0.2 % ophthalmic solution Place 2 (two) times daily into both eyes.   Yes [provider]  carbamazepine (TEGRETOL) 200 MG tablet Take 200 mg by mouth 2 (two) times daily. 02/13/17  Yes [provider]  cholecalciferol (VITAMIN D) 1000 UNITS tablet Take 1,000 Units by mouth daily.   Yes [provider]  cyanocobalamin (,VITAMIN B-12,) 1000 MCG/ML injection Inject 1,000 mcg into the muscle every 30 (thirty) days.   Yes [provider]  dicyclomine (BENTYL) 20 MG tablet Take 1 tablet (20 mg total) by mouth 3 (three) times daily before meals. 09/03/17  Yes Demetrios Loll, MD  midodrine (PROAMATINE) 10 MG tablet Take 10 mg by mouth daily.  07/06/17  Yes [provider]  pantoprazole (PROTONIX) 40 MG tablet TAKE 1 TABLET EVERY DAY 10/08/16  Yes Lucilla Lame, MD  pramipexole (MIRAPEX) 0.125 MG tablet Take 0.125 mg by mouth daily at 2 PM. 03/17/17  Yes [provider]  QUEtiapine (SEROQUEL) 25 MG tablet Take 50 mg by mouth at bedtime.  02/13/17  Yes [provider]  timolol (TIMOPTIC) 0.5 % ophthalmic solution Place 1 drop 2 (two) times daily into both eyes.    Yes [provider]  bisacodyl (DULCOLAX) 5 MG EC tablet Take 1 tablet (5 mg total) by mouth daily as needed for moderate constipation. Patient not taking: Reported on 04/09/2017 02/11/17   Vaughan Basta, MD  cephALEXin (KEFLEX) 500 MG capsule  Take 1 capsule (500 mg total) by mouth every 12 (twelve) hours. Patient not taking: Reported on 09/16/2017 09/03/17   Demetrios Loll, MD  lidocaine-prilocaine (EMLA) cream Apply 1 application every Monday, Wednesday, and Friday topically. At dialysis 05/06/17   [provider]    Allergies Morphine and related; Indomethacin; and Pollen extract    Social History Social History   Tobacco Use  . Smoking status: Never Smoker  . Smokeless tobacco: Never Used  Substance Use Topics  . Alcohol use: No    Alcohol/week: 0.0 oz  . Drug use: No    Review of Systems Patient denies headaches, rhinorrhea, blurry vision, numbness, shortness of breath, chest pain, edema, cough, abdominal pain, nausea, vomiting, diarrhea, dysuria, fevers, rashes or hallucinations unless otherwise stated above in HPI. ____________________________________________   PHYSICAL EXAM:  VITAL SIGNS: Vitals:   09/16/17 2230 09/16/17 2300  BP: (!) 173/69 (!) 151/71  Pulse: (!) 104 Marland Kitchen)  103  Resp:    Temp:    SpO2: 94% 92%    Constitutional: Alert, Anxious and paranoid appearing, in no acute distress. Eyes: Conjunctivae are normal.  Head: Atraumatic. Nose: No congestion/rhinnorhea. Mouth/Throat: Mucous membranes are moist.   Neck: No stridor. Painless ROM.  Cardiovascular: Normal rate, regular rhythm. Grossly normal heart sounds.  Good peripheral circulation.  Left AV fistula with palpable frill and bruit Respiratory: Normal respiratory effort.  No retractions. Lungs CTAB. Gastrointestinal: Soft and nontender. No distention. No abdominal bruits. No CVA tenderness. Musculoskeletal: No lower extremity tenderness nor edema.  No joint effusions. Neurologic:  Normal speech and language. No gross focal neurologic deficits are appreciated. No facial droop Skin:  Skin is warm, dry and intact. No rash noted. Psychiatric: denies SI or HI.  + hallucinations but no command hallucinations.  Patient  withdrawn ____________________________________________   LABS (all labs ordered are listed, but only abnormal results are displayed)  Results for orders placed or performed during the hospital encounter of 09/16/17 (from the past 24 hour(s))  CBC     Status: Abnormal   Collection Time: 09/16/17  7:08 PM  Result Value Ref Range   WBC 7.7 3.6 - 11.0 K/uL   RBC 3.86 3.80 - 5.20 MIL/uL   Hemoglobin 12.4 12.0 - 16.0 g/dL   HCT 36.9 35.0 - 47.0 %   MCV 95.4 80.0 - 100.0 fL   MCH 32.1 26.0 - 34.0 pg   MCHC 33.7 32.0 - 36.0 g/dL   RDW 15.6 (H) 11.5 - 14.5 %   Platelets 305 150 - 440 K/uL  Comprehensive metabolic panel     Status: Abnormal   Collection Time: 09/16/17  7:08 PM  Result Value Ref Range   Sodium 139 135 - 145 mmol/L   Potassium 2.8 (L) 3.5 - 5.1 mmol/L   Chloride 100 (L) 101 - 111 mmol/L   CO2 25 22 - 32 mmol/L   Glucose, Bld 115 (H) 65 - 99 mg/dL   BUN 26 (H) 6 - 20 mg/dL   Creatinine, Ser 4.72 (H) 0.44 - 1.00 mg/dL   Calcium 9.8 8.9 - 10.3 mg/dL   Total Protein 7.7 6.5 - 8.1 g/dL   Albumin 4.5 3.5 - 5.0 g/dL   AST 22 15 - 41 U/L   ALT 12 (L) 14 - 54 U/L   Alkaline Phosphatase 58 38 - 126 U/L   Total Bilirubin 0.8 0.3 - 1.2 mg/dL   GFR calc non Af Amer 9 (L) >60 mL/min   GFR calc Af Amer 10 (L) >60 mL/min   Anion gap 14 5 - 15  Urinalysis, Complete w Microscopic     Status: Abnormal   Collection Time: 09/16/17  7:09 PM  Result Value Ref Range   Color, Urine YELLOW (A) YELLOW   APPearance HAZY (A) CLEAR   Specific Gravity, Urine 1.006 1.005 - 1.030   pH 7.0 5.0 - 8.0   Glucose, UA NEGATIVE NEGATIVE mg/dL   Hgb urine dipstick NEGATIVE NEGATIVE   Bilirubin Urine NEGATIVE NEGATIVE   Ketones, ur NEGATIVE NEGATIVE mg/dL   Protein, ur NEGATIVE NEGATIVE mg/dL   Nitrite NEGATIVE NEGATIVE   Leukocytes, UA SMALL (A) NEGATIVE   RBC / HPF 0-5 0 - 5 RBC/hpf   WBC, UA 6-30 0 - 5 WBC/hpf   Bacteria, UA RARE (A) NONE SEEN   Squamous Epithelial / LPF 0-5 (A) NONE SEEN    ____________________________________________ ____________________________________________  RADIOLOGY   ____________________________________________   PROCEDURES  Procedure(s) performed:  Procedures    Critical Care performed: no ____________________________________________   INITIAL IMPRESSION / ASSESSMENT AND PLAN / ED COURSE  Pertinent labs & imaging results that were available during my care of the patient were reviewed by me and considered in my medical decision making (see chart for details).  DDX: Psychosis, delirium, medication effect, noncompliance, polysubstance abuse, Si, Hi, depression   Joyce [name] is a 72 y.o. who presents to the ED with for evaluation of paranoia.  Patient has psych history of bipolar disorder.  Also missed dialysis..  Laboratory testing was ordered to evaluation for underlying electrolyte derangement or signs of underlying organic pathology to explain today's presentation.  No hyperkalemia and no evidence of volume overload.  Based on history and physical and laboratory evaluation, it appears that the patient's presentation is 2/2 underlying psychiatric disorder and will require further evaluation by psychiatry.      ____________________________________________   FINAL CLINICAL IMPRESSION(S) / ED DIAGNOSES  Final diagnoses:  Paranoia ([place])  ESRD on dialysis ([place])      NEW MEDICATIONS STARTED DURING THIS VISIT:  New Prescriptions   No medications on file     Note:  This document was prepared using Dragon voice recognition software and may include unintentional dictation errors.    Merlyn Lot, MD 09/16/17 [phone]

## 2017-09-17 ENCOUNTER — Inpatient Hospital Stay
Admission: AD | Admit: 2017-09-17 | Discharge: 2017-10-22 | DRG: 885 | Disposition: A | Discharge status: Home / Self Care | Payer: Medicare HMO | Attending: Psychiatry | Admitting: Psychiatry

## 2017-09-17 DIAGNOSIS — Z90710 Acquired absence of both cervix and uterus: Secondary | ICD-10-CM | POA: Diagnosis not present

## 2017-09-17 DIAGNOSIS — H409 Unspecified glaucoma: Secondary | ICD-10-CM | POA: Diagnosis present

## 2017-09-17 DIAGNOSIS — K449 Diaphragmatic hernia without obstruction or gangrene: Secondary | ICD-10-CM | POA: Diagnosis present

## 2017-09-17 DIAGNOSIS — K5790 Diverticulosis of intestine, part unspecified, without perforation or abscess without bleeding: Secondary | ICD-10-CM | POA: Diagnosis present

## 2017-09-17 DIAGNOSIS — T82868A Thrombosis of vascular prosthetic devices, implants and grafts, initial encounter: Secondary | ICD-10-CM | POA: Diagnosis not present

## 2017-09-17 DIAGNOSIS — F3164 Bipolar disorder, current episode mixed, severe, with psychotic features: Principal | ICD-10-CM | POA: Diagnosis present

## 2017-09-17 DIAGNOSIS — G9340 Encephalopathy, unspecified: Secondary | ICD-10-CM | POA: Diagnosis not present

## 2017-09-17 DIAGNOSIS — Z86010 Personal history of colonic polyps: Secondary | ICD-10-CM | POA: Diagnosis not present

## 2017-09-17 DIAGNOSIS — Z6828 Body mass index (BMI) 28.0-28.9, adult: Secondary | ICD-10-CM

## 2017-09-17 DIAGNOSIS — N186 End stage renal disease: Secondary | ICD-10-CM | POA: Diagnosis present

## 2017-09-17 DIAGNOSIS — I272 Pulmonary hypertension, unspecified: Secondary | ICD-10-CM | POA: Diagnosis present

## 2017-09-17 DIAGNOSIS — I959 Hypotension, unspecified: Secondary | ICD-10-CM | POA: Diagnosis not present

## 2017-09-17 DIAGNOSIS — Y712 Prosthetic and other implants, materials and accessory cardiovascular devices associated with adverse incidents: Secondary | ICD-10-CM | POA: Diagnosis not present

## 2017-09-17 DIAGNOSIS — R011 Cardiac murmur, unspecified: Secondary | ICD-10-CM | POA: Diagnosis present

## 2017-09-17 DIAGNOSIS — T8241XA Breakdown (mechanical) of vascular dialysis catheter, initial encounter: Secondary | ICD-10-CM | POA: Diagnosis not present

## 2017-09-17 DIAGNOSIS — R410 Disorientation, unspecified: Secondary | ICD-10-CM | POA: Diagnosis not present

## 2017-09-17 DIAGNOSIS — G4700 Insomnia, unspecified: Secondary | ICD-10-CM | POA: Diagnosis not present

## 2017-09-17 DIAGNOSIS — Z9115 Patient's noncompliance with renal dialysis: Secondary | ICD-10-CM

## 2017-09-17 DIAGNOSIS — N390 Urinary tract infection, site not specified: Secondary | ICD-10-CM | POA: Diagnosis not present

## 2017-09-17 DIAGNOSIS — F3110 Bipolar disorder, current episode manic without psychotic features, unspecified: Secondary | ICD-10-CM | POA: Diagnosis not present

## 2017-09-17 DIAGNOSIS — R4182 Altered mental status, unspecified: Secondary | ICD-10-CM | POA: Diagnosis not present

## 2017-09-17 DIAGNOSIS — I951 Orthostatic hypotension: Secondary | ICD-10-CM | POA: Diagnosis not present

## 2017-09-17 DIAGNOSIS — K5792 Diverticulitis of intestine, part unspecified, without perforation or abscess without bleeding: Secondary | ICD-10-CM | POA: Diagnosis not present

## 2017-09-17 DIAGNOSIS — R0789 Other chest pain: Secondary | ICD-10-CM | POA: Diagnosis not present

## 2017-09-17 DIAGNOSIS — F312 Bipolar disorder, current episode manic severe with psychotic features: Secondary | ICD-10-CM

## 2017-09-17 DIAGNOSIS — Z992 Dependence on renal dialysis: Secondary | ICD-10-CM

## 2017-09-17 DIAGNOSIS — Z823 Family history of stroke: Secondary | ICD-10-CM | POA: Diagnosis not present

## 2017-09-17 DIAGNOSIS — I132 Hypertensive heart and chronic kidney disease with heart failure and with stage 5 chronic kidney disease, or end stage renal disease: Secondary | ICD-10-CM | POA: Diagnosis not present

## 2017-09-17 DIAGNOSIS — Z136 Encounter for screening for cardiovascular disorders: Secondary | ICD-10-CM | POA: Diagnosis not present

## 2017-09-17 DIAGNOSIS — Z9049 Acquired absence of other specified parts of digestive tract: Secondary | ICD-10-CM

## 2017-09-17 DIAGNOSIS — Q613 Polycystic kidney, unspecified: Secondary | ICD-10-CM

## 2017-09-17 DIAGNOSIS — D631 Anemia in chronic kidney disease: Secondary | ICD-10-CM | POA: Diagnosis present

## 2017-09-17 DIAGNOSIS — I5032 Chronic diastolic (congestive) heart failure: Secondary | ICD-10-CM | POA: Diagnosis not present

## 2017-09-17 DIAGNOSIS — K219 Gastro-esophageal reflux disease without esophagitis: Secondary | ICD-10-CM | POA: Diagnosis present

## 2017-09-17 DIAGNOSIS — Z79899 Other long term (current) drug therapy: Secondary | ICD-10-CM | POA: Diagnosis not present

## 2017-09-17 DIAGNOSIS — N2581 Secondary hyperparathyroidism of renal origin: Secondary | ICD-10-CM | POA: Diagnosis not present

## 2017-09-17 DIAGNOSIS — Z888 Allergy status to other drugs, medicaments and biological substances status: Secondary | ICD-10-CM

## 2017-09-17 DIAGNOSIS — Z515 Encounter for palliative care: Secondary | ICD-10-CM

## 2017-09-17 DIAGNOSIS — R627 Adult failure to thrive: Secondary | ICD-10-CM | POA: Diagnosis not present

## 2017-09-17 DIAGNOSIS — I10 Essential (primary) hypertension: Secondary | ICD-10-CM | POA: Diagnosis not present

## 2017-09-17 DIAGNOSIS — G9341 Metabolic encephalopathy: Secondary | ICD-10-CM | POA: Diagnosis not present

## 2017-09-17 DIAGNOSIS — I120 Hypertensive chronic kidney disease with stage 5 chronic kidney disease or end stage renal disease: Secondary | ICD-10-CM | POA: Diagnosis not present

## 2017-09-17 DIAGNOSIS — F315 Bipolar disorder, current episode depressed, severe, with psychotic features: Secondary | ICD-10-CM | POA: Diagnosis not present

## 2017-09-17 DIAGNOSIS — R079 Chest pain, unspecified: Secondary | ICD-10-CM | POA: Diagnosis not present

## 2017-09-17 DIAGNOSIS — G2581 Restless legs syndrome: Secondary | ICD-10-CM | POA: Diagnosis present

## 2017-09-17 DIAGNOSIS — Z885 Allergy status to narcotic agent status: Secondary | ICD-10-CM

## 2017-09-17 DIAGNOSIS — J9811 Atelectasis: Secondary | ICD-10-CM | POA: Diagnosis not present

## 2017-09-17 DIAGNOSIS — Z66 Do not resuscitate: Secondary | ICD-10-CM | POA: Diagnosis not present

## 2017-09-17 DIAGNOSIS — F319 Bipolar disorder, unspecified: Secondary | ICD-10-CM | POA: Diagnosis not present

## 2017-09-17 DIAGNOSIS — R9431 Abnormal electrocardiogram [ECG] [EKG]: Secondary | ICD-10-CM | POA: Diagnosis not present

## 2017-09-17 DIAGNOSIS — Z5181 Encounter for therapeutic drug level monitoring: Secondary | ICD-10-CM | POA: Diagnosis not present

## 2017-09-17 DIAGNOSIS — R55 Syncope and collapse: Secondary | ICD-10-CM | POA: Diagnosis not present

## 2017-09-17 DIAGNOSIS — Z8249 Family history of ischemic heart disease and other diseases of the circulatory system: Secondary | ICD-10-CM

## 2017-09-17 DIAGNOSIS — F22 Delusional disorders: Secondary | ICD-10-CM | POA: Diagnosis not present

## 2017-09-17 LAB — CARBAMAZEPINE LEVEL, TOTAL: Carbamazepine Lvl: 10.3 ug/mL (ref 4.0–12.0)

## 2017-09-17 MED ORDER — MAGNESIUM HYDROXIDE 400 MG/5ML PO SUSP: 30.0000 mL | Freq: Every day | ORAL | Status: DC | PRN

## 2017-09-17 MED ORDER — VITAMIN D 1000 UNITS PO TABS
1000.0000 [IU] | ORAL_TABLET | Freq: Every day | ORAL | Status: DC
  Administered 2017-09-17: 1000 [IU] via ORAL
  Filled 2017-09-17: qty 1

## 2017-09-17 MED ORDER — TIMOLOL MALEATE 0.5 % OP SOLN
1.0000 [drp] | Freq: Two times a day (BID) | OPHTHALMIC | Status: DC
  Administered 2017-09-18 – 2017-10-22 (×51): 1 [drp] via OPHTHALMIC
  Filled 2017-09-17: qty 5

## 2017-09-17 MED ORDER — VITAMIN D 1000 UNITS PO TABS
1000.0000 [IU] | ORAL_TABLET | Freq: Every day | ORAL | Status: DC
  Administered 2017-09-18 – 2017-09-29 (×10): 1000 [IU] via ORAL
  Filled 2017-09-17 (×12): qty 1

## 2017-09-17 MED ORDER — LIDOCAINE-PRILOCAINE 2.5-2.5 % EX CREA
1.0000 "application " | TOPICAL_CREAM | CUTANEOUS | Status: DC
  Administered 2017-10-05: 1 via TOPICAL
  Filled 2017-09-17 (×2): qty 5

## 2017-09-17 MED ORDER — CYANOCOBALAMIN 1000 MCG/ML IJ SOLN
1000.0000 ug | INTRAMUSCULAR | Status: DC
  Filled 2017-09-17: qty 1

## 2017-09-17 MED ORDER — MIDODRINE HCL 5 MG PO TABS
10.0000 mg | ORAL_TABLET | Freq: Every day | ORAL | Status: DC
  Administered 2017-09-17: 10 mg via ORAL
  Filled 2017-09-17: qty 2

## 2017-09-17 MED ORDER — ACETAMINOPHEN 325 MG PO TABS: 325.0000 mg | ORAL_TABLET | Freq: Two times a day (BID) | ORAL | Status: DC | PRN

## 2017-09-17 MED ORDER — BISACODYL 5 MG PO TBEC
5.0000 mg | DELAYED_RELEASE_TABLET | Freq: Every day | ORAL | Status: DC | PRN
  Filled 2017-09-17: qty 1

## 2017-09-17 MED ORDER — PRAMIPEXOLE DIHYDROCHLORIDE 0.25 MG PO TABS
0.1250 mg | ORAL_TABLET | Freq: Every day | ORAL | Status: DC
  Administered 2017-09-19 – 2017-09-22 (×2): 0.125 mg via ORAL
  Filled 2017-09-17 (×3): qty 1
  Filled 2017-09-17: qty 0.5
  Filled 2017-09-17: qty 1
  Filled 2017-09-17: qty 0.5
  Filled 2017-09-17 (×2): qty 1

## 2017-09-17 MED ORDER — CARBAMAZEPINE 200 MG PO TABS
200.0000 mg | ORAL_TABLET | Freq: Two times a day (BID) | ORAL | Status: DC
  Administered 2017-09-17 (×2): 200 mg via ORAL
  Filled 2017-09-17 (×2): qty 1

## 2017-09-17 MED ORDER — ARIPIPRAZOLE 2 MG PO TABS
2.0000 mg | ORAL_TABLET | Freq: Every day | ORAL | Status: DC
  Administered 2017-09-17: 2 mg via ORAL
  Filled 2017-09-17: qty 1

## 2017-09-17 MED ORDER — ARIPIPRAZOLE 5 MG PO TABS: 5.0000 mg | ORAL_TABLET | Freq: Every day | ORAL | Status: DC

## 2017-09-17 MED ORDER — CYANOCOBALAMIN 1000 MCG/ML IJ SOLN
1000.0000 ug | INTRAMUSCULAR | Status: DC
  Administered 2017-10-17: 1000 ug via INTRAMUSCULAR
  Filled 2017-09-17: qty 1

## 2017-09-17 MED ORDER — CEPHALEXIN 500 MG PO CAPS
500.0000 mg | ORAL_CAPSULE | Freq: Two times a day (BID) | ORAL | Status: DC
  Administered 2017-09-18 – 2017-09-24 (×13): 500 mg via ORAL
  Filled 2017-09-17 (×14): qty 1

## 2017-09-17 MED ORDER — TIMOLOL MALEATE 0.5 % OP SOLN
1.0000 [drp] | Freq: Two times a day (BID) | OPHTHALMIC | Status: DC
  Administered 2017-09-17 (×2): 1 [drp] via OPHTHALMIC
  Filled 2017-09-17: qty 5

## 2017-09-17 MED ORDER — ARIPIPRAZOLE 5 MG PO TABS
5.0000 mg | ORAL_TABLET | Freq: Every day | ORAL | Status: DC
  Administered 2017-09-18 – 2017-09-22 (×5): 5 mg via ORAL
  Filled 2017-09-17 (×5): qty 1

## 2017-09-17 MED ORDER — CARBAMAZEPINE 200 MG PO TABS
200.0000 mg | ORAL_TABLET | Freq: Two times a day (BID) | ORAL | Status: DC
  Administered 2017-09-18 – 2017-09-29 (×19): 200 mg via ORAL
  Filled 2017-09-17 (×21): qty 1

## 2017-09-17 MED ORDER — MIDODRINE HCL 5 MG PO TABS
10.0000 mg | ORAL_TABLET | Freq: Every day | ORAL | Status: DC
  Administered 2017-09-18 – 2017-09-22 (×4): 10 mg via ORAL
  Filled 2017-09-17 (×6): qty 2

## 2017-09-17 MED ORDER — DICYCLOMINE HCL 20 MG PO TABS
20.0000 mg | ORAL_TABLET | Freq: Three times a day (TID) | ORAL | Status: DC
  Administered 2017-09-17 (×2): 20 mg via ORAL
  Filled 2017-09-17 (×2): qty 1

## 2017-09-17 MED ORDER — ACETAMINOPHEN 325 MG PO TABS: 650.0000 mg | ORAL_TABLET | Freq: Four times a day (QID) | ORAL | Status: DC | PRN

## 2017-09-17 MED ORDER — LIDOCAINE-PRILOCAINE 2.5-2.5 % EX CREA: 1.0000 "application " | TOPICAL_CREAM | CUTANEOUS | Status: DC

## 2017-09-17 MED ORDER — DICYCLOMINE HCL 20 MG PO TABS
20.0000 mg | ORAL_TABLET | Freq: Three times a day (TID) | ORAL | Status: DC
  Administered 2017-09-18 – 2017-10-22 (×55): 20 mg via ORAL
  Filled 2017-09-17 (×105): qty 1

## 2017-09-17 MED ORDER — LORAZEPAM 1 MG PO TABS
1.0000 mg | ORAL_TABLET | Freq: Once | ORAL | Status: AC
  Administered 2017-09-17: 1 mg via ORAL
  Filled 2017-09-17: qty 1

## 2017-09-17 MED ORDER — BRIMONIDINE TARTRATE 0.2 % OP SOLN
1.0000 [drp] | Freq: Two times a day (BID) | OPHTHALMIC | Status: DC
  Administered 2017-09-17 (×2): 1 [drp] via OPHTHALMIC
  Filled 2017-09-17 (×2): qty 5

## 2017-09-17 MED ORDER — ALUM & MAG HYDROXIDE-SIMETH 200-200-20 MG/5ML PO SUSP
30.0000 mL | ORAL | Status: DC | PRN
  Administered 2017-10-09 – 2017-10-17 (×6): 30 mL via ORAL
  Filled 2017-09-17 (×6): qty 30

## 2017-09-17 MED ORDER — BRIMONIDINE TARTRATE 0.2 % OP SOLN
1.0000 [drp] | Freq: Two times a day (BID) | OPHTHALMIC | Status: DC
  Administered 2017-09-18 – 2017-10-22 (×48): 1 [drp] via OPHTHALMIC
  Filled 2017-09-17: qty 5

## 2017-09-17 MED ORDER — PANTOPRAZOLE SODIUM 40 MG PO TBEC
40.0000 mg | DELAYED_RELEASE_TABLET | Freq: Every day | ORAL | Status: DC
  Administered 2017-09-17: 40 mg via ORAL
  Filled 2017-09-17: qty 1

## 2017-09-17 MED ORDER — QUETIAPINE FUMARATE 25 MG PO TABS: 50.0000 mg | ORAL_TABLET | Freq: Every day | ORAL | Status: DC

## 2017-09-17 MED ORDER — ACETAMINOPHEN 325 MG PO TABS
325.0000 mg | ORAL_TABLET | Freq: Two times a day (BID) | ORAL | Status: DC | PRN
  Administered 2017-09-25 – 2017-10-21 (×21): 325 mg via ORAL
  Filled 2017-09-17 (×22): qty 1

## 2017-09-17 MED ORDER — CEPHALEXIN 500 MG PO CAPS
500.0000 mg | ORAL_CAPSULE | Freq: Two times a day (BID) | ORAL | Status: DC
  Administered 2017-09-17 (×2): 500 mg via ORAL
  Filled 2017-09-17 (×3): qty 1

## 2017-09-17 MED ORDER — PANTOPRAZOLE SODIUM 40 MG PO TBEC
40.0000 mg | DELAYED_RELEASE_TABLET | Freq: Every day | ORAL | Status: DC
  Administered 2017-09-18 – 2017-10-22 (×22): 40 mg via ORAL
  Filled 2017-09-17 (×29): qty 1

## 2017-09-17 MED ORDER — PRAMIPEXOLE DIHYDROCHLORIDE 0.25 MG PO TABS
0.1250 mg | ORAL_TABLET | Freq: Every day | ORAL | Status: DC
  Administered 2017-09-17: 0.125 mg via ORAL
  Filled 2017-09-17: qty 1

## 2017-09-17 NOTE — ED Notes (Signed)
Pt. On phone talking to family member when this nurse introduced himself to patient.  Pt. Finished call and appeared in a good mood.  Pt. Aware she will be admitted downstairs.  Pt. Denies SI/HI at this time.

## 2017-09-17 NOTE — ED Notes (Addendum)
Patient states she had bath before coming here , but used wipes to wipe her bottom and clean underwear and uniform provided .

## 2017-09-17 NOTE — ED Notes (Signed)
Pt IVC/ pending placement  

## 2017-09-17 NOTE — ED Notes (Signed)
Pt provided dinner tray.

## 2017-09-17 NOTE — BH Assessment (Signed)
Pt has been accepted to the BMU  Room 325 Referring: Dr. Weber Cooks Admitting: Dr. Mamie Nick Report: [phone]

## 2017-09-17 NOTE — ED Notes (Signed)
Pt transported to [facility] per ED Medic and ODS officer.

## 2017-09-17 NOTE — ED Notes (Signed)
Visitor to bedside at this time.

## 2017-09-17 NOTE — ED Notes (Signed)
Pt provided lunch tray at this time  

## 2017-09-17 NOTE — ED Notes (Signed)
ED BHU [place] Is the patient under IVC or is there intent for IVC: Yes.   Is the patient medically cleared: Yes.   Is there vacancy in the ED BHU: Yes.   Is the population mix appropriate for patient: Yes.   Is the patient awaiting placement in inpatient or outpatient setting: waiting for bed to be assigned on [facility] Has the patient had a psychiatric consult: Yes.   Survey of unit performed for contraband, proper placement and condition of furniture, tampering with fixtures in bathroom, shower, and each patient room: Yes.  ; Findings:  APPEARANCE/BEHAVIOR Calm and cooperative NEURO ASSESSMENT Orientation: oriented x3  Denies pain Hallucinations: No.None noted (Hallucinations) Speech: Normal Gait: normal RESPIRATORY ASSESSMENT Even  Unlabored respirations  CARDIOVASCULAR ASSESSMENT Pulses equal   regular rate  Skin warm and dry   GASTROINTESTINAL ASSESSMENT no GI complaint EXTREMITIES Full ROM  PLAN OF CARE Provide calm/safe environment. Vital signs assessed twice daily. ED BHU Assessment once each 12-hour shift. Collaborate with TTS daily or as condition indicates. Assure the ED provider has rounded once each shift. Provide and encourage hygiene. Provide redirection as needed. Assess for escalating behavior; address immediately and inform ED provider.  Assess family dynamic and appropriateness for visitation as needed: Yes.  ; If necessary, describe findings:  Educate the patient/family about BHU procedures/visitation: Yes.  ; If necessary, describe findings:

## 2017-09-17 NOTE — ED Notes (Signed)
Patient assigned to appropriate care area. Patient oriented to unit/care area: Informed that, for their safety, care areas are designed for safety and monitored by security cameras at all times; phone times and visiting hours explained to patient. Patient verbalizes understanding, and verbal contract for safety obtained.  Phone provided so that she may call her sister

## 2017-09-17 NOTE — ED Notes (Signed)
Pt ambulatory to bathroom independently

## 2017-09-17 NOTE — Consult Note (Signed)
[facility] Face-to-Face Psychiatry Consult   Reason for Consult: Consult for 72 year old woman with a past history of bipolar disorder who is back in the hospital with confusion and paranoia and odd thinking. Referring Physician: Jimmye Norman Patient Identification: Tela Kotecki MRN:  [id number] Principal Diagnosis: Bipolar I disorder, most recent episode (or current) manic ([place]) Diagnosis:   Patient Active Problem List   Diagnosis Date Noted  . Bipolar I disorder, most recent episode (or current) manic ([place]) [F31.10] 09/02/2017  . Acute delirium [R41.0] 09/02/2017  . Altered mental status [R41.82]   . Acute encephalopathy [G93.40] 09/01/2017  . ESRD on dialysis ([place]) [N18.6, Z99.2] 04/06/2017  . Complication of vascular access for dialysis [T82.9XXA] 04/06/2017  . Hematuria [R31.9] 12/18/2016  . Polycystic kidney [Q61.3] 10/12/2016  . Ruptured cyst of kidney [Q61.00] 10/03/2016  . HTN (hypertension) [I10] 10/01/2016  . Chronic kidney disease [N18.9] 10/01/2016  . Anemia associated with chronic renal failure [D63.1] 09/24/2016  . Acute kidney insufficiency [N28.9] 07/28/2016  . Noninfectious diarrhea [K52.9]   . Benign neoplasm of cecum [D12.0]   . Benign neoplasm of ascending colon [D12.2]   . Diarrhea [R19.7]   . Nausea [R11.0]   . Gastric polyp [K31.7]   . Chronic constipation [K59.09] 12/27/2014  . Nausea with vomiting [R11.2] 12/27/2014  . Left sided abdominal pain [R10.9] 12/27/2014  . Personal history of colonic polyps [Z86.010] 05/10/2013    Total Time spent with patient: 1 hour  Subjective:   Shawnique Mariotti is a 72 y.o. female patient admitted with "I was just not thinking right".  HPI: Patient interviewed chart reviewed.  72 year old woman was brought to the emergency room after her sister came by to check on her and found the patient paranoid confused and acting strangely.  Patient did not go to her dialysis session yesterday.  On interview today the patient reports  that she feels like her mind is racing and that she has strange thoughts that she cannot articulate.  Her sleep pattern has been erratic.  She says that yesterday she just could not get her thoughts together to go to dialysis.  She has a hard time explaining all of this.  She says she thinks that she has been compliant with her medicine "when I can find it".  Denies any wish to die or hurt anyone else.  Does say that she sometimes has hallucinations.  This is the second time that she has been here to the hospital within 2 weeks with similar circumstances.  I spoke with the patient's sister who tells me this is been going on for about a month or so now that the patient has been confused not coming back to her baseline not thinking right and that it seems to them like the bipolar disorder she has had in the past.  Since her last visit to the hospital in mid February she has seen her outpatient psychiatrist who has adjusted her medicine but the patient cannot clearly explain in what way.  Social history: Patient lives independently in a trailer.  When she is at her baseline she is able to drive herself and take care of herself.  Sister is her closest relative and looks in on her.  Medical history: End-stage renal disease on dialysis.  Substance abuse history: None  Past Psychiatric History: Patient has a history of bipolar disorder and has been maintained on carbamazepine and low doses of antipsychotics by her outpatient psychiatrist.  She does have a past history of hospitalization but it  has been many years ago.  Also has a past history of suicidal behavior but again it has been many years ago.  Patient cannot remember previous medication she has been on.  Our access to records is limited because of her inpatient treatment was so long ago.  She does currently see Dr. Thurmond Butts regularly for outpatient treatment.  Risk to Self: Is patient at risk for suicide?: No Risk to Others:   Prior Inpatient Therapy:    Prior Outpatient Therapy:    Past Medical History:  Past Medical History:  Diagnosis Date  . Anal fissure   . Bipolar affective disorder ([place])   . CKD (chronic kidney disease)    Dr Holley Raring [name] 4  . Colon polyps   . Diverticulitis   . Diverticulitis   . Family history of adverse reaction to anesthesia    mom - PONV  . GERD (gastroesophageal reflux disease)   . Headache    migraines - none over 10 yrs  . Heart murmur   . History of hiatal hernia   . Hypertension   . Pancreatitis    Valproic acid  . Vertigo     Past Surgical History:  Procedure Laterality Date  . A/V FISTULAGRAM Left 04/14/2017   Procedure: A/V Fistulagram;  Surgeon: Katha Cabal, MD;  Location: [facility];  Service: Cardiovascular;  Laterality: Left;  . A/V FISTULAGRAM Left 06/09/2017   Procedure: A/V FISTULAGRAM;  Surgeon: Katha Cabal, MD;  Location: [facility];  Service: Cardiovascular;  Laterality: Left;  . ABDOMINAL HYSTERECTOMY  1990 ?  . AV FISTULA PLACEMENT  4/30  . BREAST EXCISIONAL BIOPSY Left 1994   neg surgical bx  . CHOLECYSTECTOMY  2003  . COLONOSCOPY  2014   Dr. Jamal Collin  . COLONOSCOPY WITH PROPOFOL N/A 09/24/2015   Procedure: COLONOSCOPY WITH random colon byopies.;  Surgeon: Lucilla Lame, MD;  Location: [facility];  Service: Endoscopy;  Laterality: N/A;  . ESOPHAGOGASTRODUODENOSCOPY (EGD) WITH PROPOFOL N/A 09/24/2015   Procedure: ESOPHAGOGASTRODUODENOSCOPY (EGD) ;  Surgeon: Lucilla Lame, MD;  Location: [facility];  Service: Endoscopy;  Laterality: N/A;  . EYE SURGERY    . PERIPHERAL VASCULAR CATHETERIZATION N/A 05/29/2015   Procedure: A/V Shuntogram/Fistulagram;  Surgeon: Katha Cabal, MD;  Location: [facility];  Service: Cardiovascular;  Laterality: N/A;  . PERIPHERAL VASCULAR CATHETERIZATION N/A 05/29/2015   Procedure: A/V Shunt Intervention;  Surgeon: Katha Cabal, MD;  Location: [facility];  Service:  Cardiovascular;  Laterality: N/A;  . POLYPECTOMY  09/24/2015   Procedure: POLYPECTOMY INTESTINAL;  Surgeon: Lucilla Lame, MD;  Location: [facility];  Service: Endoscopy;;  cecal polyp ascending polyp   Family History:  Family History  Problem Relation Age of Onset  . Stroke Father   . Hypertension Father   . Breast cancer Other   . Colon cancer Neg Hx   . Liver disease Neg Hx    Family Psychiatric  History: Unknown Social History:  Social History   Substance and Sexual Activity  Alcohol Use No  . Alcohol/week: 0.0 oz     Social History   Substance and Sexual Activity  Drug Use No    Social History   Socioeconomic History  . Marital status: Single    Spouse name: Not on file  . Number of children: 1  . Years of education: Not on file  . Highest education level: Not on file  Social Needs  . Financial resource strain:  Not on file  . Food insecurity - worry: Not on file  . Food insecurity - inability: Not on file  . Transportation needs - medical: Not on file  . Transportation needs - non-medical: Not on file  Occupational History  . Occupation: Surveyor, quantity: [employer]  Tobacco Use  . Smoking status: Never Smoker  . Smokeless tobacco: Never Used  Substance and Sexual Activity  . Alcohol use: No    Alcohol/week: 0.0 oz  . Drug use: No  . Sexual activity: Not Currently  Other Topics Concern  . 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   Additional Social History:    Allergies:   Allergies  Allergen Reactions  . Morphine And Related Shortness Of Breath    Pt reports chest pain and difficulty breathing.  . Indomethacin Hives  . Pollen Extract Other (See Comments)    Sinus problems and HA    Labs:  Results for orders placed or performed during the hospital encounter of 09/16/17 (from the past 48 hour(s))  CBC     Status: Abnormal   Collection Time:  09/16/17  7:08 PM  Result Value Ref Range   WBC 7.7 3.6 - 11.0 K/uL   RBC 3.86 3.80 - 5.20 MIL/uL   Hemoglobin 12.4 12.0 - 16.0 g/dL   HCT 36.9 35.0 - 47.0 %   MCV 95.4 80.0 - 100.0 fL   MCH 32.1 26.0 - 34.0 pg   MCHC 33.7 32.0 - 36.0 g/dL   RDW 15.6 (H) 11.5 - 14.5 %   Platelets 305 150 - 440 K/uL    Comment: Performed at [facility], [address] [place], [place] [address]  Comprehensive metabolic panel     Status: Abnormal   Collection Time: 09/16/17  7:08 PM  Result Value Ref Range   Sodium 139 135 - 145 mmol/L   Potassium 2.8 (L) 3.5 - 5.1 mmol/L   Chloride 100 (L) 101 - 111 mmol/L   CO2 25 22 - 32 mmol/L   Glucose, Bld 115 (H) 65 - 99 mg/dL   BUN 26 (H) 6 - 20 mg/dL   Creatinine, Ser 4.72 (H) 0.44 - 1.00 mg/dL   Calcium 9.8 8.9 - 10.3 mg/dL   Total Protein 7.7 6.5 - 8.1 g/dL   Albumin 4.5 3.5 - 5.0 g/dL   AST 22 15 - 41 U/L   ALT 12 (L) 14 - 54 U/L   Alkaline Phosphatase 58 38 - 126 U/L   Total Bilirubin 0.8 0.3 - 1.2 mg/dL   GFR calc non Af Amer 9 (L) >60 mL/min   GFR calc Af Amer 10 (L) >60 mL/min    Comment: (NOTE) The eGFR has been calculated using the CKD EPI equation. This calculation has not been validated in all clinical situations. eGFR's persistently <60 mL/min signify possible Chronic Kidney Disease.    Anion gap 14 5 - 15    Comment: Performed at [facility], [address] [place], [place] [address]  Urinalysis, Complete w Microscopic     Status: Abnormal   Collection Time: 09/16/17  7:09 PM  Result Value Ref Range   Color, Urine YELLOW (A) YELLOW   APPearance HAZY (A) CLEAR   Specific Gravity, Urine 1.006 1.005 - 1.030   pH 7.0 5.0 - 8.0   Glucose, UA NEGATIVE NEGATIVE mg/dL   Hgb urine dipstick NEGATIVE  NEGATIVE   Bilirubin Urine NEGATIVE NEGATIVE   Ketones, ur NEGATIVE NEGATIVE mg/dL   Protein, ur NEGATIVE NEGATIVE mg/dL   Nitrite NEGATIVE NEGATIVE   Leukocytes, UA SMALL (A) NEGATIVE   RBC / HPF 0-5 0 - 5 RBC/hpf    WBC, UA 6-30 0 - 5 WBC/hpf   Bacteria, UA RARE (A) NONE SEEN   Squamous Epithelial / LPF 0-5 (A) NONE SEEN    Comment: Performed at [facility], [address] [place], [place] [address]    Current Facility-Administered Medications  Medication Dose Route Frequency Provider Last Rate Last Dose  . acetaminophen (TYLENOL) tablet 325 mg  325 mg Oral BID PRN Earleen Newport, MD      . Derrill Memo ON 09/18/2017] ARIPiprazole (ABILIFY) tablet 5 mg  5 mg Oral Daily Clapacs, [name], MD      . bisacodyl (DULCOLAX) EC tablet 5 mg  5 mg Oral Daily PRN Earleen Newport, MD      . brimonidine (ALPHAGAN) 0.2 % ophthalmic solution 1 drop  1 drop Both Eyes BID Earleen Newport, MD   1 drop at 09/17/17 1252  . carbamazepine (TEGRETOL) tablet 200 mg  200 mg Oral BID Earleen Newport, MD   200 mg at 09/17/17 1251  . cephALEXin (KEFLEX) capsule 500 mg  500 mg Oral Q12H Earleen Newport, MD   500 mg at 09/17/17 1251  . cholecalciferol (VITAMIN D) tablet 1,000 Units  1,000 Units Oral Daily Earleen Newport, MD   1,000 Units at 09/17/17 1251  . cyanocobalamin ((VITAMIN B-12)) injection 1,000 mcg  1,000 mcg Intramuscular Q30 days Earleen Newport, MD      . dicyclomine (BENTYL) tablet 20 mg  20 mg Oral TID AC Earleen Newport, MD   20 mg at 09/17/17 1251  . [START ON 09/18/2017] lidocaine-prilocaine (EMLA) cream 1 application  1 application Topical Q M,W,F Earleen Newport, MD      . midodrine (PROAMATINE) tablet 10 mg  10 mg Oral Daily Earleen Newport, MD   10 mg at 09/17/17 1252  . pantoprazole (PROTONIX) EC tablet 40 mg  40 mg Oral Daily Earleen Newport, MD   40 mg at 09/17/17 1251  . pramipexole (MIRAPEX) tablet 0.125 mg  0.125 mg Oral Q1400 Earleen Newport, MD   0.125 mg at 09/17/17 1300  . timolol (TIMOPTIC) 0.5 % ophthalmic solution 1 drop  1 drop Both Eyes BID Earleen Newport, MD   1 drop at 09/17/17 1252   Current Outpatient Medications  Medication  Sig Dispense Refill  . acetaminophen (TYLENOL) 325 MG tablet Take 325 mg 2 (two) times daily as needed by mouth for moderate pain or headache.     . ARIPiprazole (ABILIFY) 2 MG tablet Take 2 mg by mouth daily.    . brimonidine (ALPHAGAN) 0.2 % ophthalmic solution Place 2 (two) times daily into both eyes.    . carbamazepine (TEGRETOL) 200 MG tablet Take 200 mg by mouth 2 (two) times daily.    . cholecalciferol (VITAMIN D) 1000 UNITS tablet Take 1,000 Units by mouth daily.    . cyanocobalamin (,VITAMIN B-12,) 1000 MCG/ML injection Inject 1,000 mcg into the muscle every 30 (thirty) days.    Marland Kitchen dicyclomine (BENTYL) 20 MG tablet Take 1 tablet (20 mg total) by mouth 3 (three) times daily before meals. 30 tablet 0  . midodrine (PROAMATINE) 10 MG tablet Take 10 mg by mouth daily.     . pantoprazole (  PROTONIX) 40 MG tablet TAKE 1 TABLET EVERY DAY 90 tablet 3  . pramipexole (MIRAPEX) 0.125 MG tablet Take 0.125 mg by mouth daily at 2 PM.  3  . QUEtiapine (SEROQUEL) 25 MG tablet Take 50 mg by mouth at bedtime.     . timolol (TIMOPTIC) 0.5 % ophthalmic solution Place 1 drop 2 (two) times daily into both eyes.     . bisacodyl (DULCOLAX) 5 MG EC tablet Take 1 tablet (5 mg total) by mouth daily as needed for moderate constipation. (Patient not taking: Reported on 04/09/2017) 30 tablet 0  . cephALEXin (KEFLEX) 500 MG capsule Take 1 capsule (500 mg total) by mouth every 12 (twelve) hours. (Patient not taking: Reported on 09/16/2017) 5 capsule 0  . lidocaine-prilocaine (EMLA) cream Apply 1 application every Monday, Wednesday, and Friday topically. At dialysis  3    Musculoskeletal: Strength & Muscle Tone: within normal limits Gait & Station: normal Patient leans: N/A  Psychiatric Specialty Exam: Physical Exam  Nursing note and vitals reviewed. Constitutional: She appears well-developed and well-nourished.  HENT:  Head: Normocephalic and atraumatic.  Eyes: Conjunctivae are normal. Pupils are equal, round, and  reactive to light.  Neck: Normal range of motion.  Cardiovascular: Regular rhythm and normal heart sounds.  Respiratory: Effort normal. No respiratory distress.  GI: Soft.  Musculoskeletal: Normal range of motion.  Neurological: She is alert.  Skin: Skin is warm and dry.  Psychiatric: Her affect is blunt. Her speech is delayed and tangential. She is not agitated. Thought content is paranoid. Cognition and memory are impaired. She expresses impulsivity. She expresses no homicidal and no suicidal ideation.    Review of Systems  HENT: Negative.   Eyes: Negative.   Respiratory: Negative.   Cardiovascular: Negative.   Gastrointestinal: Negative.   Musculoskeletal: Negative.   Skin: Negative.   Neurological: Positive for weakness.  Psychiatric/Behavioral: Positive for depression and hallucinations. Negative for substance abuse and suicidal ideas. The patient is nervous/anxious and has insomnia.     Blood pressure 115/72, pulse 98, temperature 98 F (36.7 C), temperature source Oral, resp. rate 16, weight 86.6 kg (191 lb), SpO2 95 %.Body mass index is 33.83 kg/m.  General Appearance: Casual  Eye Contact:  Fair  Speech:  Slow  Volume:  Decreased  Mood:  Anxious and Dysphoric  Affect:  Constricted  Thought Process:  Disorganized  Orientation:  Full (Time, Place, and Person)  Thought Content:  Illogical, Rumination and Tangential  Suicidal Thoughts:  No  Homicidal Thoughts:  No  Memory:  Immediate;   Fair Recent;   Poor Remote;   Fair  Judgement:  Impaired  Insight:  Shallow  Psychomotor Activity:  Decreased  Concentration:  Concentration: Fair  Recall:  Poor  Fund of Knowledge:  Fair  Language:  Fair  Akathisia:  No  Handed:  Right  AIMS (if indicated):     Assets:  Desire for Improvement Housing Resilience Social Support  ADL's:  Impaired  Cognition:  Impaired,  Mild  Sleep:        Treatment Plan Summary: Daily contact with patient to assess and evaluate symptoms  and progress in treatment, Medication management and Plan 64-year-old woman with bipolar disorder who seems to be having mixed symptoms with psychotic features bizarre behavior.  Intermittent possible delirium.  Underlying bipolar disorder probably largely responsible.  I am planning to admit her to the psychiatric ward if she does not need inpatient medical treatment.  She is being put back on her usual outpatient  medicines and I am going to adjust that by increasing her Abilify dose slightly.  I have ordered a Tegretol level.  I have ordered consultation with nephrology as she will need to be followed for her dialysis while she is in the hospital.  I have left a message with Dr. Thurmond Butts letting him know that she is back in the hospital.  Case reviewed with TTS and ER physician.  Disposition: Recommend psychiatric Inpatient admission when medically cleared. Supportive therapy provided about ongoing stressors.  Alethia Berthold, MD 09/17/2017 3:36 PM

## 2017-09-18 ENCOUNTER — Other Ambulatory Visit: Payer: Self-pay

## 2017-09-18 DIAGNOSIS — F312 Bipolar disorder, current episode manic severe with psychotic features: Secondary | ICD-10-CM

## 2017-09-18 LAB — RENAL FUNCTION PANEL
ALBUMIN: 3.9 g/dL (ref 3.5–5.0)
ANION GAP: 13 (ref 5–15)
BUN: 23 mg/dL — ABNORMAL HIGH (ref 6–20)
CHLORIDE: 101 mmol/L (ref 101–111)
CO2: 26 mmol/L (ref 22–32)
Calcium: 8.7 mg/dL — ABNORMAL LOW (ref 8.9–10.3)
Creatinine, Ser: 3.08 mg/dL — ABNORMAL HIGH (ref 0.44–1.00)
GFR, EST AFRICAN AMERICAN: 17 mL/min — AB (ref >60)
GFR, EST NON AFRICAN AMERICAN: 14 mL/min — AB (ref >60)
Glucose, Bld: 117 mg/dL — ABNORMAL HIGH (ref 65–99)
PHOSPHORUS: 2 mg/dL — AB (ref 2.5–4.6)
POTASSIUM: 2.8 mmol/L — AB (ref 3.5–5.1)
Sodium: 140 mmol/L (ref 135–145)

## 2017-09-18 LAB — LIPID PANEL
CHOL/HDL RATIO: 3.8 ratio
CHOLESTEROL: 251 mg/dL — AB (ref 0–200)
HDL: 66 mg/dL (ref >40)
LDL Cholesterol: 160 mg/dL — ABNORMAL HIGH (ref 0–99)
TRIGLYCERIDES: 124 mg/dL (ref <150)
VLDL: 25 mg/dL (ref 0–40)

## 2017-09-18 LAB — HEMOGLOBIN A1C
Hgb A1c MFr Bld: 4.8 % (ref 4.8–5.6)
MEAN PLASMA GLUCOSE: 91.06 mg/dL

## 2017-09-18 LAB — CARBAMAZEPINE LEVEL, TOTAL: Carbamazepine Lvl: 11 ug/mL (ref 4.0–12.0)

## 2017-09-18 LAB — TSH: TSH: 3.291 u[IU]/mL (ref 0.350–4.500)

## 2017-09-18 MED ORDER — POTASSIUM CHLORIDE 20 MEQ PO PACK
40.0000 meq | PACK | Freq: Once | ORAL | Status: AC
  Administered 2017-09-18: 40 meq via ORAL
  Filled 2017-09-18: qty 2

## 2017-09-18 NOTE — Progress Notes (Signed)
HD COMPLETED  

## 2017-09-18 NOTE — [facility] Group Notes (Signed)
[facility] Group Notes:  (Nursing/MHT/Case Management/Adjunct)  Date:  09/18/2017  Time:  3:39 PM  Type of Therapy:  Psychoeducational Skills  Participation Level:  Did Not Attend  Adela Lank [facility] 09/18/2017, 3:39 PM

## 2017-09-18 NOTE — Progress Notes (Signed)
Hd started  

## 2017-09-18 NOTE — [facility] Group Notes (Signed)
LCSW Group Therapy Note  09/18/2017 1:00 pm  Type of Therapy and Topic:  Group Therapy:  Feelings around Relapse and Recovery  Participation Level:  Did Not Attend   Description of Group:    Patients in this group will discuss emotions they experience before and after a relapse. They will process how experiencing these feelings, or avoidance of experiencing them, relates to having a relapse. Facilitator will guide patients to explore emotions they have related to recovery. Patients will be encouraged to process which emotions are more powerful. They will be guided to discuss the emotional reaction significant others in their lives may have to their relapse or recovery. Patients will be assisted in exploring ways to respond to the emotions of others without this contributing to a relapse.  Therapeutic Goals: 1. Patient will identify two or more emotions that lead to a relapse for them 2. Patient will identify two emotions that result when they relapse 3. Patient will identify two emotions related to recovery 4. Patient will demonstrate ability to communicate their needs through discussion and/or role plays   Summary of Patient Progress:     Therapeutic Modalities:   Cognitive Behavioral Therapy Solution-Focused Therapy Assertiveness Training Relapse Prevention Therapy   Devona Konig, LCSW 09/18/2017 4:33 PM

## 2017-09-18 NOTE — Plan of Care (Signed)
Pt. Denies pain this evening. Pt. Does not present with psychotic features this evening. Pt. Verbalizes understanding of provided education. Pt. Reports doing, "better" this evening. Pt. Compliant with medications. Pt. Does not Participate in groups and unit activities. Pt eating adequately. Pt. Denies SI/HI. Pt. States she can remain safe while on the unit. Pt. Is able to verbalize she can remain safe while on the unit.    Progressing Pain Managment: General experience of comfort will improve 09/18/2017 2332 - Progressing by Reyes Ivan, RN Education: Will be free of psychotic symptoms 09/18/2017 2332 - Progressing by Reyes Ivan, RN Knowledge of the prescribed therapeutic regimen will improve 09/18/2017 2332 - Progressing by Reyes Ivan, RN Coping: Ability to cope will improve 09/18/2017 2332 - Progressing by Reyes Ivan, RN Ability to verbalize feelings will improve 09/18/2017 2332 - Progressing by Reyes Ivan, RN Health Behavior/Discharge Planning: Compliance with prescribed medication regimen will improve 09/18/2017 2332 - Progressing by Reyes Ivan, RN Nutritional: Ability to achieve adequate nutritional intake will improve 09/18/2017 2332 - Progressing by Reyes Ivan, RN Safety: Ability to remain free from injury will improve 09/18/2017 2332 - Progressing by Reyes Ivan, RN Self-Concept: Ability to verbalize positive feelings about self will improve 09/18/2017 2332 - Progressing by Reyes Ivan, RN

## 2017-09-18 NOTE — Tx Team (Signed)
Interdisciplinary Treatment and Diagnostic Plan Update  09/18/2017 Time of Session: 10:30am Joyce [name] MRN: [id number]  Principal Diagnosis: <principal problem not specified>  Secondary Diagnoses: Active Problems:   Bipolar affective disorder, current episode manic with psychotic symptoms (HCC)   Current Medications:  Current Facility-Administered Medications  Medication Dose Route Frequency Provider Last Rate Last Dose  . acetaminophen (TYLENOL) tablet 325 mg  325 mg Oral BID PRN Clapacs, [name], MD      . acetaminophen (TYLENOL) tablet 650 mg  650 mg Oral Q6H PRN Clapacs, [name], MD      . alum & mag hydroxide-simeth (MAALOX/MYLANTA) 200-200-20 MG/5ML suspension 30 mL  30 mL Oral Q4H PRN Clapacs, [name], MD      . ARIPiprazole (ABILIFY) tablet 5 mg  5 mg Oral Daily Clapacs, [name], MD      . bisacodyl (DULCOLAX) EC tablet 5 mg  5 mg Oral Daily PRN Clapacs, [name], MD      . brimonidine (ALPHAGAN) 0.2 % ophthalmic solution 1 drop  1 drop Both Eyes BID Clapacs, [name], MD   1 drop at 09/18/17 0825  . carbamazepine (TEGRETOL) tablet 200 mg  200 mg Oral BID Clapacs, [name], MD   200 mg at 09/18/17 0813  . cephALEXin (KEFLEX) capsule 500 mg  500 mg Oral Q12H Clapacs, [name], MD      . cholecalciferol (VITAMIN D) tablet 1,000 Units  1,000 Units Oral Daily Clapacs, [name], MD   1,000 Units at 09/18/17 0813  . [START ON 10/17/2017] cyanocobalamin ((VITAMIN B-12)) injection 1,000 mcg  1,000 mcg Intramuscular Q30 days Clapacs, [name], MD      . dicyclomine (BENTYL) tablet 20 mg  20 mg Oral TID AC Clapacs, [name], MD      . lidocaine-prilocaine (EMLA) cream 1 application  1 application Topical Q M,W,F Clapacs, [name], MD      . magnesium hydroxide (MILK OF MAGNESIA) suspension 30 mL  30 mL Oral Daily PRN Clapacs, [name], MD      . midodrine (PROAMATINE) tablet 10 mg  10 mg Oral Daily Clapacs, [name], MD      . pantoprazole (PROTONIX) EC tablet 40 mg  40 mg Oral Daily Clapacs, [name], MD       . pramipexole (MIRAPEX) tablet 0.125 mg  0.125 mg Oral Q1400 Clapacs, [name], MD      . timolol (TIMOPTIC) 0.5 % ophthalmic solution 1 drop  1 drop Both Eyes BID Clapacs, [name], MD   1 drop at 09/18/17 0825   PTA Medications: Medications Prior to Admission  Medication Sig Dispense Refill Last Dose  . acetaminophen (TYLENOL) 325 MG tablet Take 325 mg 2 (two) times daily as needed by mouth for moderate pain or headache.    unknown  . ARIPiprazole (ABILIFY) 2 MG tablet Take 2 mg by mouth daily.   unknown  . bisacodyl (DULCOLAX) 5 MG EC tablet Take 1 tablet (5 mg total) by mouth daily as needed for moderate constipation. (Patient not taking: Reported on 04/09/2017) 30 tablet 0 Not Taking at Unknown time  . brimonidine (ALPHAGAN) 0.2 % ophthalmic solution Place 2 (two) times daily into both eyes.   unknown  . carbamazepine (TEGRETOL) 200 MG tablet Take 200 mg by mouth 2 (two) times daily.   unknown  . cephALEXin (KEFLEX) 500 MG capsule Take 1 capsule (500 mg total) by mouth every 12 (twelve) hours. (Patient not taking: Reported on 09/16/2017)  5 capsule 0 Completed Course at Unknown time  . cholecalciferol (VITAMIN D) 1000 UNITS tablet Take 1,000 Units by mouth daily.   unknown  . cyanocobalamin (,VITAMIN B-12,) 1000 MCG/ML injection Inject 1,000 mcg into the muscle every 30 (thirty) days.   unknown  . dicyclomine (BENTYL) 20 MG tablet Take 1 tablet (20 mg total) by mouth 3 (three) times daily before meals. 30 tablet 0 unknown  . lidocaine-prilocaine (EMLA) cream Apply 1 application every Monday, Wednesday, and Friday topically. At dialysis  3 Taking  . midodrine (PROAMATINE) 10 MG tablet Take 10 mg by mouth daily.    unknown  . pantoprazole (PROTONIX) 40 MG tablet TAKE 1 TABLET EVERY DAY 90 tablet 3 unknown  . pramipexole (MIRAPEX) 0.125 MG tablet Take 0.125 mg by mouth daily at 2 PM.  3 unknown  . QUEtiapine (SEROQUEL) 25 MG tablet Take 50 mg by mouth at bedtime.    unknown  . timolol (TIMOPTIC) 0.5  % ophthalmic solution Place 1 drop 2 (two) times daily into both eyes.    unknown    Patient Stressors: Health problems  Patient Strengths: Ability for insight Active sense of humor Average or above average intelligence Capable of independent living Occupational psychologist fund of knowledge Supportive family/friends  Treatment Modalities: Medication Management, Group therapy, Case management,  1 to 1 session with clinician, Psychoeducation, Recreational therapy.   Physician Treatment Plan for Primary Diagnosis: <principal problem not specified> Long Term Goal(s): Improvement in symptoms so as ready for discharge NA   Short Term Goals: Ability to identify changes in lifestyle to reduce recurrence of condition will improve Ability to verbalize feelings will improve Ability to disclose and discuss suicidal ideas Ability to demonstrate self-control will improve Ability to identify and develop effective coping behaviors will improve Ability to identify triggers associated with substance abuse/mental health issues will improve NA  Medication Management: Evaluate patient's response, side effects, and tolerance of medication regimen.  Therapeutic Interventions: 1 to 1 sessions, Unit Group sessions and Medication administration.  Evaluation of Outcomes: Progressing  Physician Treatment Plan for Secondary Diagnosis: Active Problems:   Bipolar affective disorder, current episode manic with psychotic symptoms ([place])  Long Term Goal(s): Improvement in symptoms so as ready for discharge NA   Short Term Goals: Ability to identify changes in lifestyle to reduce recurrence of condition will improve Ability to verbalize feelings will improve Ability to disclose and discuss suicidal ideas Ability to demonstrate self-control will improve Ability to identify and develop effective coping behaviors will improve Ability to identify triggers associated with substance  abuse/mental health issues will improve NA     Medication Management: Evaluate patient's response, side effects, and tolerance of medication regimen.  Therapeutic Interventions: 1 to 1 sessions, Unit Group sessions and Medication administration.  Evaluation of Outcomes: Progressing   RN Treatment Plan for Primary Diagnosis: <principal problem not specified> Long Term Goal(s): Knowledge of disease and therapeutic regimen to maintain health will improve  Short Term Goals: Ability to demonstrate self-control and Compliance with prescribed medications will improve  Medication Management: RN will administer medications as ordered by provider, will assess and evaluate patient's response and provide education to patient for prescribed medication. RN will report any adverse and/or side effects to prescribing provider.  Therapeutic Interventions: 1 on 1 counseling sessions, Psychoeducation, Medication administration, Evaluate responses to treatment, Monitor vital signs and CBGs as ordered, Perform/monitor CIWA, COWS, AIMS and Fall Risk screenings as ordered, Perform wound care treatments as ordered.  Evaluation of  Outcomes: Progressing   LCSW Treatment Plan for Primary Diagnosis: <principal problem not specified> Long Term Goal(s): Safe transition to appropriate next level of care at discharge, Engage patient in therapeutic group addressing interpersonal concerns.  Short Term Goals: Engage patient in aftercare planning with referrals and resources, Increase social support and Increase skills for wellness and recovery  Therapeutic Interventions: Assess for all discharge needs, 1 to 1 time with Social worker, Explore available resources and support systems, Assess for adequacy in community support network, Educate family and significant other(s) on suicide prevention, Complete Psychosocial Assessment, Interpersonal group therapy.  Evaluation of Outcomes: Progressing   Progress in  Treatment: Attending groups: Yes. Participating in groups: Yes. Taking medication as prescribed: Yes. Toleration medication: Yes. Family/Significant other contact made: No, will contact:  Family member Patient understands diagnosis: Yes. Discussing patient identified problems/goals with staff: Yes. Medical problems stabilized or resolved: Yes. Denies suicidal/homicidal ideation: Yes. Issues/concerns per patient self-inventory: Yes. Other:   New problem(s) identified: No, Describe:  None  New Short Term/Long Term Goal(s): "To be able to focus and trust a person and not doubt my doctor."  Discharge Plan or Barriers: To return home and follow up with establish provider.   Reason for Continuation of Hospitalization: Medication stabilization  Estimated Length of Stay: 3-5   Attendees: Patient: Joyce [name] 09/18/2017 11:22 AM  Physician: Dr. Carollee Herter, MD 09/18/2017 11:22 AM  Nursing: Elige Radon, RN 09/18/2017 11:22 AM  RN Care Manager:  09/18/2017 11:22 AM  Social Worker: Darin Engels, [place] 09/18/2017 11:22 AM  Recreational Therapist:  09/18/2017 11:22 AM  Other: Sherrian Divers- [facility] Peer Support 09/18/2017 11:22 AM  Other:  09/18/2017 11:22 AM  Other: 09/18/2017 11:22 AM    Scribe for Treatment Team: Darin Engels, LCSW 09/18/2017 11:22 AM

## 2017-09-18 NOTE — Consult Note (Signed)
I spoke with Dr. Thurmond Butts by phone today.  Although I am no longer directly involved in the patient's care I had left a message for him when the patient came through the emergency room.  I informed him that she had been admitted to the psychiatric ward.  He wanted to pass on a couple of pieces of information.  First of all he strongly suggests against ever trying Depakote as that has caused pancreatitis for her.  Also he reminds [place] that lithium in his opinion was the cause of the patient's kidney failure.  Also that he thought that recently she seemed to have good response to the combination of Seroquel and Abilify when she was taking it.  I am leaving this note for the treatment team just by way of information.

## 2017-09-18 NOTE — Progress Notes (Signed)
Patient received back on unit from dialysis.

## 2017-09-18 NOTE — Progress Notes (Addendum)
Patient ID: Joyce [name], female   DOB: 01-24-1946, 73 y.o.   MRN: [id number] 72 year old caucasian female admitted IVC for bizarre behavior. Family brought patient in when she missed Wednesday's dialysis and began acting bizarrely. Patient arrives on the unit @2245 . Patient is calm and cooperative. When asked why if she knew why she was here, patient states, "Yea, I was acting crazy. I missed dialysis. I'm doing better." Patient is alert and oriented x4. Denies SI, HI, AVH. Patient speech is logical and coherent, appropriate volume and speed. Affect is appropriate. Cooperative with assessment and admission process. Skin is clear, no contraband found. Gait is steady. Patient appears younger than stated age. Only moment of confusion was when patient states she has no belongings but belongings were found. Denies depression and anxiety. Patient reports one previous hospitalization "many years ago." Reports positive support system. Medical hx of kidney disease. Takes dialysis Monday, Wednesday, and Friday. Fistula in left arm. ED report states that patient was paranoid and selectively mute but that she has been organized and appropriate this evening. Bipolar hx. Labs are abnormal but medically cleared. Patient reports she feels fine. Patient ate a snack and drank a soda upon admission. Patient showered. Compliant with lab draw. Signed all forms. Q 15 minute checks initiated. Will continue to monitor throughout the shift.  Patient slept 5.25 hours. No apparent distress. Will endorse care to oncoming shift.

## 2017-09-18 NOTE — Plan of Care (Signed)
  Progressing Education: Will be free of psychotic symptoms 09/18/2017 1910 - Progressing by Rise Mu, RN Knowledge of the prescribed therapeutic regimen will improve 09/18/2017 1910 - Progressing by Rise Mu, RN Coping: Ability to cope will improve 09/18/2017 1910 - Progressing by Rise Mu, RN Ability to verbalize feelings will improve 09/18/2017 1910 - Progressing by Rise Mu, RN Health Behavior/Discharge Planning: Compliance with prescribed medication regimen will improve 09/18/2017 1910 - Progressing by Rise Mu, RN Nutritional: Ability to achieve adequate nutritional intake will improve 09/18/2017 1910 - Progressing by Rise Mu, RN Safety: Ability to redirect hostility and anger into socially appropriate behaviors will improve 09/18/2017 1910 - Progressing by Rise Mu, RN Ability to remain free from injury will improve 09/18/2017 1910 - Progressing by Rise Mu, RN Self-Concept: Ability to verbalize positive feelings about self will improve 09/18/2017 1910 - Progressing by Rise Mu, RN Activity: Ability to tolerate increased activity will improve 09/18/2017 1910 - Progressing by Rise Mu, RN Coping: Development of coping mechanisms to deal with changes in body function or appearance will improve 09/18/2017 1910 - Progressing by Rise Mu, RN Fluid Volume: Compliance with measures to maintain balanced fluid volume will improve 09/18/2017 1910 - Progressing by Rise Mu, RN Physical Regulation: Complications related to the disease process, condition or treatment will be avoided or minimized 09/18/2017 1910 - Progressing by Rise Mu, RN  Pleasant and cooperative.  Denies SI/HI/AVH.  Dialysis today.  Tolerated well.  Support and encouragement offered.  Safety maintained.

## 2017-09-18 NOTE — Progress Notes (Signed)
K+ of 2.8 after dialysis today. Will give Potassium 26mEq.

## 2017-09-18 NOTE — Tx Team (Signed)
Initial Treatment Plan 09/18/2017 3:19 AM Morrell Riddle Heine LRJ:[id number]    PATIENT STRESSORS: Health problems   PATIENT STRENGTHS: Ability for insight Active sense of humor Average or above average intelligence Capable of independent living Communication skills Financial means General fund of knowledge Supportive family/friends   PATIENT IDENTIFIED PROBLEMS: Psychosis 09/17/2017                     DISCHARGE CRITERIA:  Ability to meet basic life and health needs Improved stabilization in mood, thinking, and/or behavior Medical problems require only outpatient monitoring Motivation to continue treatment in a less acute level of care Need for constant or close observation no longer present Verbal commitment to aftercare and medication compliance  PRELIMINARY DISCHARGE PLAN: Outpatient therapy Return to previous living arrangement  PATIENT/FAMILY INVOLVEMENT: This treatment plan has been presented to and reviewed with the patient, Joyce [name], and/or family member.  The patient and family have been given the opportunity to ask questions and make suggestions.  Derek Mound, RN 09/18/2017, 3:19 AM

## 2017-09-18 NOTE — Progress Notes (Signed)
Report given to [facility] RN in dialysis.  Patient escorted to dialysis via wheelchair.

## 2017-09-18 NOTE — Plan of Care (Signed)
New admission Not Progressing Elimination: Will not experience complications related to bowel motility 09/18/2017 0215 - Not Progressing by Derek Mound, RN Will not experience complications related to urinary retention 09/18/2017 0215 - Not Progressing by Derek Mound, RN Pain Managment: General experience of comfort will improve 09/18/2017 0215 - Not Progressing by Derek Mound, RN Education: Will be free of psychotic symptoms 09/18/2017 0215 - Not Progressing by Derek Mound, RN Knowledge of the prescribed therapeutic regimen will improve 09/18/2017 0215 - Not Progressing by Derek Mound, RN Coping: Ability to cope will improve 09/18/2017 0215 - Not Progressing by Derek Mound, RN Ability to verbalize feelings will improve 09/18/2017 0215 - Not Progressing by Derek Mound, RN Health Behavior/Discharge Planning: Compliance with prescribed medication regimen will improve 09/18/2017 0215 - Not Progressing by Derek Mound, RN Nutritional: Ability to achieve adequate nutritional intake will improve 09/18/2017 0215 - Not Progressing by Derek Mound, RN Safety: Ability to redirect hostility and anger into socially appropriate behaviors will improve 09/18/2017 0215 - Not Progressing by Derek Mound, RN Ability to remain free from injury will improve 09/18/2017 0215 - Not Progressing by Derek Mound, RN Self-Concept: Ability to verbalize positive feelings about self will improve 09/18/2017 0215 - Not Progressing by Derek Mound, RN Activity: Ability to tolerate increased activity will improve 09/18/2017 0215 - Not Progressing by Derek Mound, RN Coping: Development of coping mechanisms to deal with changes in body function or appearance will improve 09/18/2017 0215 - Not Progressing by Derek Mound, RN Fluid Volume: Compliance with measures to maintain balanced fluid volume will improve 09/18/2017 0215 - Not Progressing by Derek Mound, RN Physical  Regulation: Complications related to the disease process, condition or treatment will be avoided or minimized 09/18/2017 0215 - Not Progressing by Derek Mound, RN

## 2017-09-18 NOTE — H&P (Addendum)
Psychiatric Admission Assessment Adult  Patient Identification: Joyce [name] MRN:  [id number] Date of Evaluation:  09/18/2017 Chief Complaint:  Major Depression Principal Diagnosis: Bipolar affective disorder, current episode manic with psychotic symptoms ([place]) Diagnosis:   Patient Active Problem List   Diagnosis Date Noted  . Bipolar affective disorder, current episode manic with psychotic symptoms ([place]) [F31.2] 09/17/2017    Priority: High  . Bipolar I disorder, most recent episode (or current) manic ([place]) [F31.10] 09/02/2017  . Acute delirium [R41.0] 09/02/2017  . Altered mental status [R41.82]   . Acute encephalopathy [G93.40] 09/01/2017  . ESRD on dialysis ([place]) [N18.6, Z99.2] 04/06/2017  . Complication of vascular access for dialysis [T82.9XXA] 04/06/2017  . Hematuria [R31.9] 12/18/2016  . Polycystic kidney [Q61.3] 10/12/2016  . Ruptured cyst of kidney [Q61.00] 10/03/2016  . HTN (hypertension) [I10] 10/01/2016  . Chronic kidney disease [N18.9] 10/01/2016  . Anemia associated with chronic renal failure [D63.1] 09/24/2016  . Acute kidney insufficiency [N28.9] 07/28/2016  . Noninfectious diarrhea [K52.9]   . Benign neoplasm of cecum [D12.0]   . Benign neoplasm of ascending colon [D12.2]   . Diarrhea [R19.7]   . Nausea [R11.0]   . Gastric polyp [K31.7]   . Chronic constipation [K59.09] 12/27/2014  . Nausea with vomiting [R11.2] 12/27/2014  . Left sided abdominal pain [R10.9] 12/27/2014  . Personal history of colonic polyps [Z86.010] 05/10/2013   History of Present Illness:   Identifying data. Joyce [name] is a 72 year old female with a history of bipolar disorder.  Chief complaint. "I just could not handle it."  History of present illness. Information was obtained from the patient and the chart. The patient came to the ER with her sister because she felt overwhelmed and "not well". Apparently she had a very busy first half of the week making appointments with her new PCP  and having her oild changed. Both activities did not go well. She also noticed paranoia and mild insomnia. She became distrustful of her pastor whom she has known for 30 years. She knows that this is irrational. On Wednesday, when it was time to go for dialysis, she just could not do it and asked to come to the hospital. She has been compliant with medications as evidenced by good Tegretol level but on the day of admission she was not even sure if she took medications and felt confused. She denies any symptoms of depression or anxiety but explains that since she started dialysis in the fall it has been difficult to cope. 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 She understands the importance of life saving treatment but took it very hard.   The patient has UTI and was started on Keflex in the ER.  Past psychiatric history. Long history of bipolar. She has been maintained on a combination of Abilify and Tegretol in the care of Dr. Thurmond Butts. There were suicides in remote past. Patient believes she has not been hospitalized in 20 years. No history of substance abuse. There is a history of pancreatitis on Depakote and possibility that treatment with Lithium contributed to kidney failure. She had good response to a combination of Abilify and Seroquel.   Family psychiatric history. None reported.  Social history. 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 If not on dialysis, she would have continued working. She has been at the same location for 30 years. Lives by herself in  a trailer. Has supportive sister and church.  Total Time spent with patient: 1 hour  Is the patient at risk to self? No.  Has the patient been a risk to self in the past 6 months? No.  Has the patient been a risk to self within the distant past? Yes.    Is the patient a risk to others? No.  Has the patient been a risk to others in the  past 6 months? No.  Has the patient been a risk to others within the distant past? No.   Prior Inpatient Therapy:   Prior Outpatient Therapy:    Alcohol Screening: 1. How often do you have a drink containing alcohol?: Never 2. How many drinks containing alcohol do you have on a typical day when you are drinking?: 1 or 2 3. How often do you have six or more drinks on one occasion?: Never AUDIT-C Score: 0 4. How often during the last year have you found that you were not able to stop drinking once you had started?: Never 5. How often during the last year have you failed to do what was normally expected from you becasue of drinking?: Never 6. How often during the last year have you needed a first drink in the morning to get yourself going after a heavy drinking session?: Never 7. How often during the last year have you had a feeling of guilt of remorse after drinking?: Never 8. How often during the last year have you been unable to remember what happened the night before because you had been drinking?: Never 9. Have you or someone else been injured as a result of your drinking?: No 10. Has a relative or friend or a doctor or another health worker been concerned about your drinking or suggested you cut down?: No Alcohol Use Disorder Identification Test Final Score (AUDIT): 0 Intervention/Follow-up: AUDIT Score <7 follow-up not indicated Substance Abuse History in the last 12 months:  No. Consequences of Substance Abuse: NA Previous Psychotropic Medications: Yes  Psychological Evaluations: No  Past Medical History:  Past Medical History:  Diagnosis Date  . Anal fissure   . Bipolar affective disorder ([place])   . CKD (chronic kidney disease)    Dr Holley Raring [name] 4  . Colon polyps   . Diverticulitis   . Diverticulitis   . Family history of adverse reaction to anesthesia    mom - PONV  . GERD (gastroesophageal reflux disease)   . Headache    migraines - none over 10 yrs  . Heart murmur    . History of hiatal hernia   . Hypertension   . Pancreatitis    Valproic acid  . Vertigo     Past Surgical History:  Procedure Laterality Date  . A/V FISTULAGRAM Left 04/14/2017   Procedure: A/V Fistulagram;  Surgeon: Katha Cabal, MD;  Location: [facility];  Service: Cardiovascular;  Laterality: Left;  . A/V FISTULAGRAM Left 06/09/2017   Procedure: A/V FISTULAGRAM;  Surgeon: Katha Cabal, MD;  Location: [facility];  Service: Cardiovascular;  Laterality: Left;  . ABDOMINAL HYSTERECTOMY  1990 ?  . AV FISTULA PLACEMENT  4/30  . BREAST EXCISIONAL BIOPSY Left 1994   neg surgical bx  . CHOLECYSTECTOMY  2003  . COLONOSCOPY  2014   Dr. Jamal Collin  . COLONOSCOPY WITH PROPOFOL N/A 09/24/2015   Procedure: COLONOSCOPY WITH random colon byopies.;  Surgeon: Lucilla Lame, MD;  Location: [facility];  Service: Endoscopy;  Laterality: N/A;  .  ESOPHAGOGASTRODUODENOSCOPY (EGD) WITH PROPOFOL N/A 09/24/2015   Procedure: ESOPHAGOGASTRODUODENOSCOPY (EGD) ;  Surgeon: Lucilla Lame, MD;  Location: [facility];  Service: Endoscopy;  Laterality: N/A;  . EYE SURGERY    . PERIPHERAL VASCULAR CATHETERIZATION N/A 05/29/2015   Procedure: A/V Shuntogram/Fistulagram;  Surgeon: Katha Cabal, MD;  Location: [facility];  Service: Cardiovascular;  Laterality: N/A;  . PERIPHERAL VASCULAR CATHETERIZATION N/A 05/29/2015   Procedure: A/V Shunt Intervention;  Surgeon: Katha Cabal, MD;  Location: [facility];  Service: Cardiovascular;  Laterality: N/A;  . POLYPECTOMY  09/24/2015   Procedure: POLYPECTOMY INTESTINAL;  Surgeon: Lucilla Lame, MD;  Location: [facility];  Service: Endoscopy;;  cecal polyp ascending polyp   Family History:  Family History  Problem Relation Age of Onset  . Stroke Father   . Hypertension Father   . Breast cancer Other   . Colon cancer Neg Hx   . Liver disease Neg Hx     Tobacco Screening: Have you used any form of tobacco  in the last 30 days? (Cigarettes, Smokeless Tobacco, Cigars, and/or Pipes): No Social History:  Social History   Substance and Sexual Activity  Alcohol Use No  . Alcohol/week: 0.0 oz     Social History   Substance and Sexual Activity  Drug Use No    Additional Social History:      History of alcohol / drug use?: No history of alcohol / drug abuse                    Allergies:   Allergies  Allergen Reactions  . Morphine And Related Shortness Of Breath    Pt reports chest pain and difficulty breathing.  . Indomethacin Hives  . Pollen Extract Other (See Comments)    Sinus problems and HA   Lab Results:  Results for orders placed or performed during the hospital encounter of 09/17/17 (from the past 48 hour(s))  Carbamazepine level, total     Status: None   Collection Time: 09/17/17 11:43 PM  Result Value Ref Range   Carbamazepine Lvl 11.0 4.0 - 12.0 ug/mL    Comment: Performed at [facility], [address] [place],  [address]  Lipid panel     Status: Abnormal   Collection Time: 09/18/17  6:46 AM  Result Value Ref Range   Cholesterol 251 (H) 0 - 200 mg/dL   Triglycerides 124 <150 mg/dL   HDL 66 >40 mg/dL   Total CHOL/HDL Ratio 3.8 RATIO   VLDL 25 0 - 40 mg/dL   LDL Cholesterol 160 (H) 0 - 99 mg/dL    Comment:        Total Cholesterol/HDL:CHD Risk Coronary Heart Disease Risk Table                     Men   Women  1/2 Average Risk   3.4   3.3  Average Risk       5.0   4.4  2 X Average Risk   9.6   7.1  3 X Average Risk  23.4   11.0        Use the calculated Patient Ratio above and the CHD Risk Table to determine the patient's CHD Risk.        ATP III CLASSIFICATION (LDL):  <100     mg/dL   Optimal  100-129  mg/dL   Near or Above  Optimal  130-159  mg/dL   Borderline  160-189  mg/dL   High  >190     mg/dL   Very High Performed at [facility], [address] [place], [place] [address]   TSH     Status:  None   Collection Time: 09/18/17  6:46 AM  Result Value Ref Range   TSH 3.291 0.350 - 4.500 uIU/mL    Comment: Performed by a 3rd Generation assay with a functional sensitivity of <=0.01 uIU/mL. Performed at [facility], [address] [place], [place] [address]     Blood Alcohol level:  Lab Results  Component Value Date   [facility] <10 29/79/8921    Metabolic Disorder Labs:  No results found for: HGBA1C, MPG No results found for: PROLACTIN Lab Results  Component Value Date   CHOL 251 (H) 09/18/2017   TRIG 124 09/18/2017   HDL 66 09/18/2017   CHOLHDL 3.8 09/18/2017   VLDL 25 09/18/2017   LDLCALC 160 (H) 09/18/2017   LDLCALC 43 05/14/2014    Current Medications: Current Facility-Administered Medications  Medication Dose Route Frequency Provider Last Rate Last Dose  . acetaminophen (TYLENOL) tablet 325 mg  325 mg Oral BID PRN Clapacs, [name], MD      . acetaminophen (TYLENOL) tablet 650 mg  650 mg Oral Q6H PRN Clapacs, [name], MD      . alum & mag hydroxide-simeth (MAALOX/MYLANTA) 200-200-20 MG/5ML suspension 30 mL  30 mL Oral Q4H PRN Clapacs, [name], MD      . ARIPiprazole (ABILIFY) tablet 5 mg  5 mg Oral Daily Clapacs, [name], MD      . bisacodyl (DULCOLAX) EC tablet 5 mg  5 mg Oral Daily PRN Clapacs, [name], MD      . brimonidine (ALPHAGAN) 0.2 % ophthalmic solution 1 drop  1 drop Both Eyes BID Clapacs, [name], MD   1 drop at 09/18/17 0825  . carbamazepine (TEGRETOL) tablet 200 mg  200 mg Oral BID Clapacs, [name], MD   200 mg at 09/18/17 0813  . cephALEXin (KEFLEX) capsule 500 mg  500 mg Oral Q12H Clapacs, [name], MD      . cholecalciferol (VITAMIN D) tablet 1,000 Units  1,000 Units Oral Daily Clapacs, [name], MD   1,000 Units at 09/18/17 0813  . [START ON 10/17/2017] cyanocobalamin ((VITAMIN B-12)) injection 1,000 mcg  1,000 mcg Intramuscular Q30 days Clapacs, [name], MD      . dicyclomine (BENTYL) tablet 20 mg  20 mg Oral TID AC Clapacs, [name], MD      .  lidocaine-prilocaine (EMLA) cream 1 application  1 application Topical Q M,W,F Clapacs, [name], MD      . magnesium hydroxide (MILK OF MAGNESIA) suspension 30 mL  30 mL Oral Daily PRN Clapacs, [name], MD      . midodrine (PROAMATINE) tablet 10 mg  10 mg Oral Daily Clapacs, [name], MD   10 mg at 09/18/17 1204  . pantoprazole (PROTONIX) EC tablet 40 mg  40 mg Oral Daily Clapacs, [name], MD      . pramipexole (MIRAPEX) tablet 0.125 mg  0.125 mg Oral Q1400 Clapacs, [name], MD      . timolol (TIMOPTIC) 0.5 % ophthalmic solution 1 drop  1 drop Both Eyes BID Clapacs, [name], MD   1 drop at 09/18/17 0825   PTA Medications: Medications Prior to Admission  Medication Sig Dispense Refill Last Dose  .  acetaminophen (TYLENOL) 325 MG tablet Take 325 mg 2 (two) times daily as needed by mouth for moderate pain or headache.    unknown  . ARIPiprazole (ABILIFY) 2 MG tablet Take 2 mg by mouth daily.   unknown  . bisacodyl (DULCOLAX) 5 MG EC tablet Take 1 tablet (5 mg total) by mouth daily as needed for moderate constipation. (Patient not taking: Reported on 04/09/2017) 30 tablet 0 Not Taking at Unknown time  . brimonidine (ALPHAGAN) 0.2 % ophthalmic solution Place 2 (two) times daily into both eyes.   unknown  . carbamazepine (TEGRETOL) 200 MG tablet Take 200 mg by mouth 2 (two) times daily.   unknown  . cephALEXin (KEFLEX) 500 MG capsule Take 1 capsule (500 mg total) by mouth every 12 (twelve) hours. (Patient not taking: Reported on 09/16/2017) 5 capsule 0 Completed Course at Unknown time  . cholecalciferol (VITAMIN D) 1000 UNITS tablet Take 1,000 Units by mouth daily.   unknown  . cyanocobalamin (,VITAMIN B-12,) 1000 MCG/ML injection Inject 1,000 mcg into the muscle every 30 (thirty) days.   unknown  . dicyclomine (BENTYL) 20 MG tablet Take 1 tablet (20 mg total) by mouth 3 (three) times daily before meals. 30 tablet 0 unknown  . lidocaine-prilocaine (EMLA) cream Apply 1 application every Monday, Wednesday, and  Friday topically. At dialysis  3 Taking  . midodrine (PROAMATINE) 10 MG tablet Take 10 mg by mouth daily.    unknown  . pantoprazole (PROTONIX) 40 MG tablet TAKE 1 TABLET EVERY DAY 90 tablet 3 unknown  . pramipexole (MIRAPEX) 0.125 MG tablet Take 0.125 mg by mouth daily at 2 PM.  3 unknown  . QUEtiapine (SEROQUEL) 25 MG tablet Take 50 mg by mouth at bedtime.    unknown  . timolol (TIMOPTIC) 0.5 % ophthalmic solution Place 1 drop 2 (two) times daily into both eyes.    unknown    Musculoskeletal: Strength & Muscle Tone: within normal limits Gait & Station: normal Patient leans: N/A  Psychiatric Specialty Exam: Physical Exam  Nursing note and vitals reviewed. Constitutional: She is oriented to person, place, and time. She appears well-developed and well-nourished.  HENT:  Head: Normocephalic and atraumatic.  Eyes: Conjunctivae and EOM are normal. Pupils are equal, round, and reactive to light.  Neck: Normal range of motion. Neck supple.  Cardiovascular: Normal rate, regular rhythm and normal heart sounds.  Respiratory: Effort normal and breath sounds normal.  GI: Soft. Bowel sounds are normal.  Musculoskeletal: Normal range of motion.  Neurological: She is alert and oriented to person, place, and time.  Skin: Skin is warm and dry.  Psychiatric: She has a normal mood and affect. Her speech is normal and behavior is normal. Thought content is paranoid. Cognition and memory are normal. She expresses impulsivity.    Review of Systems  Neurological: Negative.   Psychiatric/Behavioral: Negative.   All other systems reviewed and are negative.   Blood pressure (!) 139/56, pulse 67, temperature 98.2 F (36.8 C), temperature source Oral, resp. rate 18, height 5\' 3"  (1.6 m), weight 72.6 kg (160 lb), SpO2 100 %.Body mass index is 28.34 kg/m.  See SRA                                                  Sleep:  Number of Hours: 5.25    Treatment Plan Summary: Daily  contact with patient to assess and evaluate symptoms and progress in treatment and Medication management   Joyce [name] is a 72 year old female with a history of bipolar disorder and ESRD admitted for psychotic break.  #Mood/psychosis -continue Abilify 5 mg daily -continue Tegretol 200 mg BID, Tegretol level 11  #UTI -continue Keflex 500 mg BID  #Dialyses -nephrology input is greatly appreciated  #GERD -Protonix 40 mg daily  #Restless legs -Mirapex 0.125 mg nightly  #Glaucoma -continue eye drops  #Vitamin deficiency -continue B12 and D  #Admission status -voluntary admission  #Disposition -discharge to home -follow up with Dr. Thurmond Butts  Observation Level/Precautions:  15 minute checks  Laboratory:  CBC Chemistry Profile UDS UA  Psychotherapy:    Medications:    Consultations:    Discharge Concerns:    Estimated LOS:  Other:     Physician Treatment Plan for Primary Diagnosis: Bipolar affective disorder, current episode manic with psychotic symptoms ([place]) Long Term Goal(s): Improvement in symptoms so as ready for discharge  Short Term Goals: Ability to identify changes in lifestyle to reduce recurrence of condition will improve, Ability to verbalize feelings will improve, Ability to disclose and discuss suicidal ideas, Ability to demonstrate self-control will improve, Ability to identify and develop effective coping behaviors will improve and Ability to identify triggers associated with substance abuse/mental health issues will improve  Physician Treatment Plan for Secondary Diagnosis: Principal Problem:   Bipolar affective disorder, current episode manic with psychotic symptoms ([place]) Active Problems:   ESRD on dialysis ([place])  Long Term Goal(s): NA  Short Term Goals: NA  I certify that inpatient services furnished can reasonably be expected to improve the patient's condition.    Orson Slick, MD 3/1/20192:01 PM

## 2017-09-18 NOTE — Progress Notes (Signed)
[facility]  ROUNDING NOTE   Subjective:   [name] admitted to [facility] on 09/17/2017 for Major Depression  Seen and examined on hemodialysis.    HEMODIALYSIS FLOWSHEET:  Blood Flow Rate (mL/min): 350 mL/min Arterial Pressure (mmHg): -140 mmHg Venous Pressure (mmHg): 220 mmHg Transmembrane Pressure (mmHg): 70 mmHg Ultrafiltration Rate (mL/min): 500 mL/min Dialysate Flow Rate (mL/min): 600 ml/min Conductivity: Machine : 13.8 Conductivity: Machine : 13.8 Dialysis Fluid Bolus: Normal Saline Bolus Amount (mL): 250 mL    Objective:  Vital signs in last 24 hours:  Temp:  [98 F (36.7 C)-98.6 F (37 C)] 98.2 F (36.8 C) (03/01 1235) Pulse Rate:  [63-98] 68 (03/01 1235) Resp:  [16-18] 18 (03/01 1415) BP: (115-142)/(54-72) 131/61 (03/01 1415) SpO2:  [95 %-100 %] 100 % (03/01 1235) Weight:  [72.6 kg (160 lb)] 72.6 kg (160 lb) (03/01 0215)  Weight change:  Filed Weights   09/18/17 0215  Weight: 72.6 kg (160 lb)    Intake/Output: No intake/output data recorded.   Intake/Output this shift:  Total I/O In: 480 [P.O.:480] Out: -   Physical Exam: General: NAD, sitting in chair  Head: Normocephalic, atraumatic. Moist oral mucosal membranes  Eyes: Anicteric, PERRL  Neck: Supple, trachea midline  Lungs:  Clear to auscultation  Heart: Regular rate and rhythm  Abdomen:  Soft, nontender,   Extremities:  no  peripheral edema.  Neurologic: Nonfocal, moving all four extremities  Skin: No lesions  Access: Left AVF    Basic Metabolic Panel: Recent Labs  Lab 09/16/17 1908 09/18/17 1248  NA 139 140  K 2.8* 2.8*  CL 100* 101  CO2 25 26  GLUCOSE 115* 117*  BUN 26* 23*  CREATININE 4.72* 3.08*  CALCIUM 9.8 8.7*  PHOS  --  2.0*    Liver Function Tests: Recent Labs  Lab 09/16/17 1908 09/18/17 1248  AST 22  --   ALT 12*  --   ALKPHOS 58  --   BILITOT 0.8  --   PROT 7.7  --   ALBUMIN 4.5 3.9   No results for input(s): LIPASE, AMYLASE in the  last 168 hours. No results for input(s): AMMONIA in the last 168 hours.  CBC: Recent Labs  Lab 09/16/17 1908  WBC 7.7  HGB 12.4  HCT 36.9  MCV 95.4  PLT 305    Cardiac Enzymes: No results for input(s): CKTOTAL, CKMB, CKMBINDEX, TROPONINI in the last 168 hours.  BNP: Invalid input(s): POCBNP  CBG: No results for input(s): GLUCAP in the last 168 hours.  Microbiology: Results for orders placed or performed during the hospital encounter of 09/01/17  Urine culture     Status: Abnormal   Collection Time: 09/01/17  4:32 PM  Result Value Ref Range Status   Specimen Description   Final    URINE, RANDOM Performed at [facility], [address] [place], [place] [address]    Special Requests   Final    NONE Performed at [facility], [address] [place], [place] [address]    Culture MULTIPLE SPECIES PRESENT, SUGGEST RECOLLECTION (A)  Final   Report Status 09/03/2017 FINAL  Final  Culture, blood (routine x 2)     Status: None   Collection Time: 09/01/17  6:05 PM  Result Value Ref Range Status   Specimen Description BLOOD RIGHT HAND  Final   Special Requests   Final    BOTTLES DRAWN AEROBIC AND ANAEROBIC Blood Culture results may not be optimal due to  an excessive volume of blood received in culture bottles   Culture   Final    NO GROWTH 5 DAYS Performed at [facility], [address] [place], [place] [address]    Report Status 09/06/2017 FINAL  Final  Culture, blood (routine x 2)     Status: None   Collection Time: 09/01/17  6:34 PM  Result Value Ref Range Status   Specimen Description BLOOD RAC  Final   Special Requests   Final    BOTTLES DRAWN AEROBIC AND ANAEROBIC Blood Culture adequate volume   Culture   Final    NO GROWTH 5 DAYS Performed at [facility], [address], [place], [place] [address]    Report Status 09/06/2017 FINAL  Final    Coagulation Studies: No results for input(s): LABPROT, INR in the last 72  hours.  Urinalysis: Recent Labs    09/16/17 1909  COLORURINE YELLOW*  LABSPEC 1.006  PHURINE 7.0  GLUCOSEU NEGATIVE  HGBUR NEGATIVE  BILIRUBINUR NEGATIVE  KETONESUR NEGATIVE  PROTEINUR NEGATIVE  NITRITE NEGATIVE  LEUKOCYTESUR SMALL*      Imaging: No results found.   Medications:    . ARIPiprazole  5 mg Oral Daily  . brimonidine  1 drop Both Eyes BID  . carbamazepine  200 mg Oral BID  . cephALEXin  500 mg Oral Q12H  . cholecalciferol  1,000 Units Oral Daily  . [START ON 10/17/2017] cyanocobalamin  1,000 mcg Intramuscular Q30 days  . dicyclomine  20 mg Oral TID AC  . lidocaine-prilocaine  1 application Topical Q M,W,F  . midodrine  10 mg Oral Daily  . pantoprazole  40 mg Oral Daily  . pramipexole  0.125 mg Oral Q1400  . timolol  1 drop Both Eyes BID   acetaminophen, acetaminophen, alum & mag hydroxide-simeth, bisacodyl, magnesium hydroxide  Assessment/ Plan:  [name] is a 72 y.o. white female with end stage renal disease on hemodialysis, diverticulosis, hypotension, bipolar disorder, polycystic kidney disease   [facility]/MWF  1.  ESRD on HD MWF.  Seen and examined on hemodialysis. Tolerating treatment well.   2.  Anemia of CKD: hemoglobin 12.4  3.  Secondary hyperparathyroidism. Outpatient PTH 150. Phos and calcium at goal  4. Hypertension: blood pressure at goal - midodrine before treatment.    LOS: 1 Joyce [name] 3/1/20192:30 PM

## 2017-09-18 NOTE — [facility] Suicide Risk Assessment (Addendum)
[facility] Admission Suicide Risk Assessment   Nursing information obtained from:  Patient Demographic factors:  Age 72 or older, Divorced or widowed, Caucasian, Living alone Current Mental Status:  NA Loss Factors:  Decline in physical health Historical Factors:  NA Risk Reduction Factors:  Positive social support  Total Time spent with patient: 1 hour Principal Problem: Bipolar affective disorder, current episode manic with psychotic symptoms ([place]) Diagnosis:   Patient Active Problem List   Diagnosis Date Noted  . Bipolar affective disorder, current episode manic with psychotic symptoms ([place]) [F31.2] 09/17/2017    Priority: High  . Bipolar I disorder, most recent episode (or current) manic ([place]) [F31.10] 09/02/2017  . Acute delirium [R41.0] 09/02/2017  . Altered mental status [R41.82]   . Acute encephalopathy [G93.40] 09/01/2017  . ESRD on dialysis ([place]) [N18.6, Z99.2] 04/06/2017  . Complication of vascular access for dialysis [T82.9XXA] 04/06/2017  . Hematuria [R31.9] 12/18/2016  . Polycystic kidney [Q61.3] 10/12/2016  . Ruptured cyst of kidney [Q61.00] 10/03/2016  . HTN (hypertension) [I10] 10/01/2016  . Chronic kidney disease [N18.9] 10/01/2016  . Anemia associated with chronic renal failure [D63.1] 09/24/2016  . Acute kidney insufficiency [N28.9] 07/28/2016  . Noninfectious diarrhea [K52.9]   . Benign neoplasm of cecum [D12.0]   . Benign neoplasm of ascending colon [D12.2]   . Diarrhea [R19.7]   . Nausea [R11.0]   . Gastric polyp [K31.7]   . Chronic constipation [K59.09] 12/27/2014  . Nausea with vomiting [R11.2] 12/27/2014  . Left sided abdominal pain [R10.9] 12/27/2014  . Personal history of colonic polyps [Z86.010] 05/10/2013   Subjective Data: psychotic break  Continued Clinical Symptoms:  Alcohol Use Disorder Identification Test Final Score (AUDIT): 0 The "Alcohol Use Disorders Identification Test", Guidelines for Use in Primary Care, Second Edition.  World Social worker [facility]). Score between 0-7:  no or low risk or alcohol related problems. Score between 8-15:  moderate risk of alcohol related problems. Score between 16-19:  high risk of alcohol related problems. Score 20 or above:  warrants further diagnostic evaluation for alcohol dependence and treatment.   CLINICAL FACTORS:   Bipolar Disorder:   Mixed State Currently Psychotic Medical Diagnoses and Treatments/Surgeries   Musculoskeletal: Strength & Muscle Tone: within normal limits Gait & Station: normal Patient leans: N/A  Psychiatric Specialty Exam: Physical Exam  Nursing note and vitals reviewed. Psychiatric: She has a normal mood and affect. Her speech is normal and behavior is normal. Judgment normal. Thought content is paranoid. Cognition and memory are normal.    Review of Systems  Neurological: Negative.   Psychiatric/Behavioral: Negative.   All other systems reviewed and are negative.   Blood pressure (!) 139/56, pulse 67, temperature 98.2 F (36.8 C), temperature source Oral, resp. rate 18, height 5\' 3"  (1.6 m), weight 72.6 kg (160 lb), SpO2 100 %.Body mass index is 28.34 kg/m.  General Appearance: Casual  Eye Contact:  Good  Speech:  Clear and Coherent  Volume:  Normal  Mood:  Euthymic  Affect:  Appropriate  Thought Process:  Goal Directed and Descriptions of Associations: Intact  Orientation:  Full (Time, Place, and Person)  Thought Content:  Paranoid Ideation  Suicidal Thoughts:  No  Homicidal Thoughts:  No  Memory:  Immediate;   Fair Recent;   Fair Remote;   Fair  Judgement:  Fair  Insight:  Present  Psychomotor Activity:  Normal  Concentration:  Concentration: Fair and Attention Span: Fair  Recall:  [employer] of Knowledge:  Fair  Language:  Fair  Akathisia:  No  Handed:  Right  AIMS (if indicated):     Assets:  Communication Skills Desire for Improvement Financial Resources/Insurance [place]  Support Transportation  ADL's:  Intact  Cognition:  WNL  Sleep:  Number of Hours: 5.25      COGNITIVE FEATURES THAT CONTRIBUTE TO RISK:  None    SUICIDE RISK:   Minimal: No identifiable suicidal ideation.  Patients presenting with no risk factors but with morbid ruminations; may be classified as minimal risk based on the severity of the depressive symptoms  PLAN OF CARE: hospital admission, medication management, dialysis, discharge planning.  Ms. Guthridge is a 72 year old female with a history of bipolar disorder and ESRD admitted for psychotic break.  #Mood/psychosis -continue Abilify 5 mg daily -continue Tegretol 200 mg BID, Tegretol level 11  #UTI -continue Keflex 500 mg BID  #Dialyses -nephrology input is greatly appreciated  #GERD -Protonix 40 mg daily  #Restless legs -Mirapex 0.125 mg nightly  #Glaucoma -continue eye drops  #Vitamin deficiency -continue B12 and D  #Disposition -discharge to home -follow up with Dr. Thurmond Butts  I certify that inpatient services furnished can reasonably be expected to improve the patient's condition.   Orson Slick, MD 09/18/2017, 2:01 PM

## 2017-09-19 LAB — BASIC METABOLIC PANEL
Anion gap: 12 (ref 5–15)
BUN: 16 mg/dL (ref 6–20)
CALCIUM: 9.6 mg/dL (ref 8.9–10.3)
CHLORIDE: 97 mmol/L — AB (ref 101–111)
CO2: 31 mmol/L (ref 22–32)
CREATININE: 3.49 mg/dL — AB (ref 0.44–1.00)
GFR calc non Af Amer: 12 mL/min — ABNORMAL LOW (ref >60)
GFR, EST AFRICAN AMERICAN: 14 mL/min — AB (ref >60)
Glucose, Bld: 117 mg/dL — ABNORMAL HIGH (ref 65–99)
Potassium: 3.9 mmol/L (ref 3.5–5.1)
Sodium: 140 mmol/L (ref 135–145)

## 2017-09-19 LAB — HEPATITIS B CORE ANTIBODY, TOTAL: Hep B Core Total Ab: NEGATIVE

## 2017-09-19 LAB — HEPATITIS B SURFACE ANTIBODY,QUALITATIVE: HEP B S AB: NONREACTIVE

## 2017-09-19 LAB — HEPATITIS B SURFACE ANTIGEN: HEP B S AG: NEGATIVE

## 2017-09-19 NOTE — [facility] Group Notes (Signed)

## 2017-09-19 NOTE — BHH Counselor (Signed)
Adult Comprehensive Assessment  Patient ID: Joyce [name], female   DOB: 06-24-1946, 72 y.o.   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 / Lack of housing: mobile home Physical health (include injuries & life threatening diseases): renal disease stage 5 Social relationships: good Substance abuse: pt denies use Bereavement / Loss: oldest brother, next brother, cat  Living/Environment/Situation:  Living Arrangements: Alone How long has patient lived in current situation?: 30 years What is atmosphere in current home: Comfortable  Family History:  Marital status: Divorced Divorced, when?: since 1982 Are you sexually active?: No What is your sexual orientation?: straight Has your sexual activity been affected by drugs, alcohol, medication, or emotional stress?: n/a Does patient have children?: Yes How many children?: 1 How is patient's relationship with their children?: 1 son, relationship is not great because of his wife  Childhood History:  By whom was/is the patient raised?: Both parents Description of patient's relationship with caregiver when they were a child: fantastic Patient's description of current relationship with people who raised him/her: mom is 70 yo- great, dad is deceased How were you disciplined when you got in trouble as a child/adolescent?: pt states it s a "loving correction" Does patient have siblings?: Yes Number of Siblings: 4 Description of patient's current relationship with siblings: 3 brothers (2 deceased) and 1 sister, pt reports they are all closed to each other Did patient suffer any verbal/emotional/physical/sexual abuse as a child?: No Did patient suffer from severe childhood neglect?: No Has patient ever been  sexually abused/assaulted/raped as an adolescent or adult?: No Was the patient ever a victim of a crime or a disaster?: No Witnessed domestic violence?: No Has patient been effected by domestic violence as an adult?: No  Education:  Highest grade of school patient has completed: Some college Currently a Ship broker?: No Learning disability?: No  Employment/Work Situation:   Employment situation: Retired Archivist job has been impacted by current illness: No What is the longest time patient has a held a job?: 30 years Where was the patient employed at that time?: [employer] Has patient ever been in the [employer]?: No Has patient ever served in combat?: No Did You Receive Any Psychiatric Treatment/Services While in Passenger transport manager?: No Are There Guns or Other Weapons in [place]?: No  Financial Resources:   Financial resources: Income from employment Does patient have a representative payee or guardian?: No  Alcohol/Substance Abuse:   What has been your use of drugs/alcohol within the last 12 months?: pt denies If attempted suicide, did drugs/alcohol play a role in this?: No Alcohol/Substance Abuse Treatment Hx: Denies past history Has alcohol/substance abuse ever caused legal problems?: No  Social Support System:   Pensions consultant Support System: Good Describe Community Support System: sister, cousin Type of faith/religion: Baptist How does patient's faith help to cope with current illness?: go to church, pray  Leisure/Recreation:   Leisure and Hobbies: singing, reading  Strengths/Needs:   What things does the patient do well?: keeping everything clean, keeping everything going  In what areas does patient struggle / problems for patient: fears  Discharge Plan:   Does patient have access to transportation?: Yes Will patient be returning to same living situation after discharge?: Yes Currently receiving community mental health services: Yes (From Whom)(Dr. Lanetta Inch) If no, would patient like referral for  services when discharged?: No Does patient have financial barriers related to discharge medications?: No  Summary/Recommendations:   Patient is a 72 year old female admitted because she felt overwhelmed and "not well". She also noticed paranoia and mild insomnia. She became distrustful of her pastor whom she has known for 30 years. Patient will benefit from crisis stabilization, medication evaluation, group therapy and psychoeducation. In addition to case management for discharge planning. At discharge it is recommended that patient adhere to the established discharge plan and continue treatment.   Joyce [name]  CUEBAS-COLON. 09/19/2017

## 2017-09-19 NOTE — Plan of Care (Signed)
  Progressing Elimination: Will not experience complications related to bowel motility 09/19/2017 1901 - Progressing by Rise Mu, RN Will not experience complications related to urinary retention 09/19/2017 1901 - Progressing by Rise Mu, RN Pain Managment: General experience of comfort will improve 09/19/2017 1901 - Progressing by Rise Mu, RN Education: Will be free of psychotic symptoms 09/19/2017 1901 - Progressing by Rise Mu, RN Knowledge of the prescribed therapeutic regimen will improve 09/19/2017 1901 - Progressing by Rise Mu, RN Coping: Ability to cope will improve 09/19/2017 1901 - Progressing by Rise Mu, RN Ability to verbalize feelings will improve 09/19/2017 1901 - Progressing by Rise Mu, RN Health Behavior/Discharge Planning: Compliance with prescribed medication regimen will improve 09/19/2017 1901 - Progressing by Rise Mu, RN Nutritional: Ability to achieve adequate nutritional intake will improve 09/19/2017 1901 - Progressing by Rise Mu, RN Safety: Ability to redirect hostility and anger into socially appropriate behaviors will improve 09/19/2017 1901 - Progressing by Rise Mu, RN Ability to remain free from injury will improve 09/19/2017 1901 - Progressing by Rise Mu, RN Self-Concept: Ability to verbalize positive feelings about self will improve 09/19/2017 1901 - Progressing by Rise Mu, RN Activity: Ability to tolerate increased activity will improve 09/19/2017 1901 - Progressing by Rise Mu, RN Coping: Development of coping mechanisms to deal with changes in body function or appearance will improve 09/19/2017 1901 - Progressing by Rise Mu, RN Fluid Volume: Compliance with measures to maintain balanced fluid volume will improve 09/19/2017 1901 - Progressing by Rise Mu, RN Physical Regulation: Complications related to the disease process, condition or treatment will be  avoided or minimized 09/19/2017 1901 - Progressing by Rise Mu, RN  Pleasant and cooperative.  Denies SI/HI/AVH.  Support and encouragement offered.  Safety maintained.

## 2017-09-19 NOTE — Progress Notes (Signed)
D:Pt denies SI/HI/AVH. Pt. Denies pain this evening. Pt. Does not present with psychotic features this evening. Pt. Reports doing, "better" this evening. Pt appetite is good when not in dialysis and able to eat. Pt. States she can remain safe while on the unit. Pt. Is able to verbalize she can remain safe while on the unit. Pt. Calm and cooperative upon assessment. Pt. Has no complaints this evening. Pt. Reports dialysis went well and has no chest pain or breathing problems. Pt. Observed this evening frequently isolative and withdrawn to her room. Did not want to get into details about admissions reasons.   A: Q x 15 minute observation checks were completed for safety. Patient was provided with education. Pt. Verbalizes understanding of provided education. Patient was given scheduled medications. Patient  was encourage to attend groups, participate in unit activities and continue with plan of care.   R:Patient is complaint with medication and unit procedures. Pt. Does not Participate in groups.             Precautionary checks every 15 minutes for safety maintained, room free of safety hazards, patient sustains no injury or falls during this shift.

## 2017-09-19 NOTE — Progress Notes (Signed)
[facility] MD Progress Note  09/19/2017 6:10 PM Joyce [name]  MRN:  [id number]   Subjective:   The patient is feeling much better today. Thought processes were well organized and she was not endorsing any psychotic symptoms including auditory or visual hallucinations. The patient denies any suicidal thoughts and says she is not depressed either. She says she did feel overwhelmed when coming in but has been learning in groups to slow down and decrease anxiety about the future. She denies any major panic attacks. She denies any feelings of hopelessness or helplessness. No crying spells. She denies any current active or passive suicidal thoughts. The patient slept 6 hours last night. Appetite is good. She has been visible in the day room socializing with other patients and visits with her sister this evening. She has enjoyed groups. Vital signs are stable and no somatic complaints. No dysuria.  Past psychiatric history. Long history of bipolar. She has been maintained on a combination of Abilify and Tegretol in the care of Dr. Thurmond Butts. There were suicides in remote past. Patient believes she has not been hospitalized in 20 years. No history of substance abuse. There is a history of pancreatitis on Depakote and possibility that treatment with Lithium contributed to kidney failure. She had good response to a combination of Abilify and Seroquel.   Family psychiatric history. None reported.  Social history. 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 If not on dialysis, she would have continued working. She has been at the same location for 30 years. Lives by herself in a trailer. Has supportive sister and church.      Principal Problem: Bipolar affective disorder, current episode manic with psychotic symptoms ([place]) Diagnosis:   Patient Active Problem List   Diagnosis Date Noted  . Bipolar affective disorder, current episode manic with psychotic symptoms ([place]) [F31.2] 09/17/2017  .  Bipolar I disorder, most recent episode (or current) manic ([place]) [F31.10] 09/02/2017  . Acute delirium [R41.0] 09/02/2017  . Altered mental status [R41.82]   . Acute encephalopathy [G93.40] 09/01/2017  . ESRD on dialysis ([place]) [N18.6, Z99.2] 04/06/2017  . Complication of vascular access for dialysis [T82.9XXA] 04/06/2017  . Hematuria [R31.9] 12/18/2016  . Polycystic kidney [Q61.3] 10/12/2016  . Ruptured cyst of kidney [Q61.00] 10/03/2016  . HTN (hypertension) [I10] 10/01/2016  . Chronic kidney disease [N18.9] 10/01/2016  . Anemia associated with chronic renal failure [D63.1] 09/24/2016  . Acute kidney insufficiency [N28.9] 07/28/2016  . Noninfectious diarrhea [K52.9]   . Benign neoplasm of cecum [D12.0]   . Benign neoplasm of ascending colon [D12.2]   . Diarrhea [R19.7]   . Nausea [R11.0]   . Gastric polyp [K31.7]   . Chronic constipation [K59.09] 12/27/2014  . Nausea with vomiting [R11.2] 12/27/2014  . Left sided abdominal pain [R10.9] 12/27/2014  . Personal history of colonic polyps [Z86.010] 05/10/2013   Total Time spent with patient: 20 minutes   Past Medical History:  Past Medical History:  Diagnosis Date  . Anal fissure   . Bipolar affective disorder ([place])   . CKD (chronic kidney disease)    Dr Holley Raring [name] 4  . Colon polyps   . Diverticulitis   . Diverticulitis   . Family history of adverse reaction to anesthesia    mom - PONV  . GERD (gastroesophageal reflux disease)   . Headache    migraines - none over 10 yrs  . Heart murmur   . History of hiatal hernia   .  Hypertension   . Pancreatitis    Valproic acid  . Vertigo     Past Surgical History:  Procedure Laterality Date  . A/V FISTULAGRAM Left 04/14/2017   Procedure: A/V Fistulagram;  Surgeon: Katha Cabal, MD;  Location: [facility];  Service: Cardiovascular;  Laterality: Left;  . A/V FISTULAGRAM Left 06/09/2017   Procedure: A/V FISTULAGRAM;  Surgeon: Katha Cabal, MD;  Location: [facility];  Service: Cardiovascular;  Laterality: Left;  . ABDOMINAL HYSTERECTOMY  1990 ?  . AV FISTULA PLACEMENT  4/30  . BREAST EXCISIONAL BIOPSY Left 1994   neg surgical bx  . CHOLECYSTECTOMY  2003  . COLONOSCOPY  2014   Dr. Jamal Collin  . COLONOSCOPY WITH PROPOFOL N/A 09/24/2015   Procedure: COLONOSCOPY WITH random colon byopies.;  Surgeon: Lucilla Lame, MD;  Location: [facility];  Service: Endoscopy;  Laterality: N/A;  . ESOPHAGOGASTRODUODENOSCOPY (EGD) WITH PROPOFOL N/A 09/24/2015   Procedure: ESOPHAGOGASTRODUODENOSCOPY (EGD) ;  Surgeon: Lucilla Lame, MD;  Location: [facility];  Service: Endoscopy;  Laterality: N/A;  . EYE SURGERY    . PERIPHERAL VASCULAR CATHETERIZATION N/A 05/29/2015   Procedure: A/V Shuntogram/Fistulagram;  Surgeon: Katha Cabal, MD;  Location: [facility];  Service: Cardiovascular;  Laterality: N/A;  . PERIPHERAL VASCULAR CATHETERIZATION N/A 05/29/2015   Procedure: A/V Shunt Intervention;  Surgeon: Katha Cabal, MD;  Location: [facility];  Service: Cardiovascular;  Laterality: N/A;  . POLYPECTOMY  09/24/2015   Procedure: POLYPECTOMY INTESTINAL;  Surgeon: Lucilla Lame, MD;  Location: [facility];  Service: Endoscopy;;  cecal polyp ascending polyp   Family History:  Family History  Problem Relation Age of Onset  . Stroke Father   . Hypertension Father   . Breast cancer Other   . Colon cancer Neg Hx   . Liver disease Neg Hx     Social History:  Social History   Substance and Sexual Activity  Alcohol Use No  . Alcohol/week: 0.0 oz     Social History   Substance and Sexual Activity  Drug Use No    Social History   Socioeconomic History  . Marital status: Single    Spouse name: None  . Number of children: 1  . Years of education: None  . Highest education level: None  Social Needs  . Financial resource strain: None  . Food insecurity - worry: None  . Food insecurity - inability: None  .  Transportation needs - medical: None  . Transportation needs - non-medical: None  Occupational History  . Occupation: Surveyor, quantity: [employer]  Tobacco Use  . Smoking status: Never Smoker  . Smokeless tobacco: Never Used  Substance and Sexual Activity  . Alcohol use: No    Alcohol/week: 0.0 oz  . Drug use: No  . Sexual activity: Not Currently  Other Topics Concern  . 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   Additional Social History:    History of alcohol / drug use?: No history of alcohol / drug abuse        Sleep: Fair  Appetite:  Good  Current Medications: Current Facility-Administered Medications  Medication Dose Route Frequency Provider Last Rate Last Dose  . acetaminophen (TYLENOL) tablet 325 mg  325 mg Oral BID PRN Clapacs, [name], MD      . acetaminophen (TYLENOL) tablet 650 mg  650  mg Oral Q6H PRN Clapacs, [name], MD      . alum & mag hydroxide-simeth (MAALOX/MYLANTA) 200-200-20 MG/5ML suspension 30 mL  30 mL Oral Q4H PRN Clapacs, [name], MD      . ARIPiprazole (ABILIFY) tablet 5 mg  5 mg Oral Daily Clapacs, [name], MD   5 mg at 09/19/17 0841  . bisacodyl (DULCOLAX) EC tablet 5 mg  5 mg Oral Daily PRN Clapacs, [name], MD      . brimonidine (ALPHAGAN) 0.2 % ophthalmic solution 1 drop  1 drop Both Eyes BID Clapacs, [name], MD   1 drop at 09/19/17 1737  . carbamazepine (TEGRETOL) tablet 200 mg  200 mg Oral BID Clapacs, [name], MD   200 mg at 09/19/17 1737  . cephALEXin (KEFLEX) capsule 500 mg  500 mg Oral Q12H Clapacs, [name], MD   500 mg at 09/19/17 0841  . cholecalciferol (VITAMIN D) tablet 1,000 Units  1,000 Units Oral Daily Clapacs, [name], MD   1,000 Units at 09/19/17 [phone]  . [START ON 10/17/2017] cyanocobalamin ((VITAMIN B-12)) injection 1,000 mcg  1,000 mcg Intramuscular Q30 days Clapacs, [name], MD      . dicyclomine (BENTYL) tablet 20 mg  20 mg Oral TID AC Clapacs, [name],  MD   20 mg at 09/19/17 1404  . lidocaine-prilocaine (EMLA) cream 1 application  1 application Topical Q M,W,F Clapacs, [name], MD      . magnesium hydroxide (MILK OF MAGNESIA) suspension 30 mL  30 mL Oral Daily PRN Clapacs, [name], MD      . midodrine (PROAMATINE) tablet 10 mg  10 mg Oral Daily Clapacs, [name], MD   10 mg at 09/19/17 0841  . pantoprazole (PROTONIX) EC tablet 40 mg  40 mg Oral Daily Clapacs, [name], MD   40 mg at 09/19/17 0841  . pramipexole (MIRAPEX) tablet 0.125 mg  0.125 mg Oral Q1400 Clapacs, [name], MD   0.125 mg at 09/19/17 1404  . timolol (TIMOPTIC) 0.5 % ophthalmic solution 1 drop  1 drop Both Eyes BID Clapacs, [name], MD   1 drop at 09/19/17 1737    Lab Results:  Results for orders placed or performed during the hospital encounter of 09/17/17 (from the past 48 hour(s))  Carbamazepine level, total     Status: None   Collection Time: 09/17/17 11:43 PM  Result Value Ref Range   Carbamazepine Lvl 11.0 4.0 - 12.0 ug/mL    Comment: Performed at [facility], [address] [place], [place] [address]  Hemoglobin A1c     Status: None   Collection Time: 09/18/17  6:46 AM  Result Value Ref Range   Hgb A1c MFr Bld 4.8 4.8 - 5.6 %    Comment: (NOTE) Pre diabetes:          5.7%-6.4% Diabetes:              >6.4% Glycemic control for   <7.0% adults with diabetes    Mean Plasma Glucose 91.06 mg/dL    Comment: Performed at [facility] [address], [place] [address]  Lipid panel     Status: Abnormal   Collection Time: 09/18/17  6:46 AM  Result Value Ref Range   Cholesterol 251 (H) 0 - 200 mg/dL   Triglycerides 124 <150 mg/dL   HDL 66 >40 mg/dL   Total CHOL/HDL Ratio 3.8 RATIO   VLDL 25 0 - 40 mg/dL  LDL Cholesterol 160 (H) 0 - 99 mg/dL    Comment:        Total Cholesterol/HDL:CHD Risk Coronary Heart Disease Risk Table                     Men   Women  1/2 Average Risk   3.4   3.3  Average Risk       5.0   4.4  2 X Average Risk   9.6    7.1  3 X Average Risk  23.4   11.0        Use the calculated Patient Ratio above and the CHD Risk Table to determine the patient's CHD Risk.        ATP III CLASSIFICATION (LDL):  <100     mg/dL   Optimal  100-129  mg/dL   Near or Above                    Optimal  130-159  mg/dL   Borderline  160-189  mg/dL   High  >190     mg/dL   Very High Performed at [facility], [address] [place], [place] [address]   TSH     Status: None   Collection Time: 09/18/17  6:46 AM  Result Value Ref Range   TSH 3.291 0.350 - 4.500 uIU/mL    Comment: Performed by a 3rd Generation assay with a functional sensitivity of <=0.01 uIU/mL. Performed at [facility], [address] [place], [place] [address]   Renal function panel     Status: Abnormal   Collection Time: 09/18/17 12:48 PM  Result Value Ref Range   Sodium 140 135 - 145 mmol/L   Potassium 2.8 (L) 3.5 - 5.1 mmol/L   Chloride 101 101 - 111 mmol/L   CO2 26 22 - 32 mmol/L   Glucose, Bld 117 (H) 65 - 99 mg/dL   BUN 23 (H) 6 - 20 mg/dL   Creatinine, Ser 3.08 (H) 0.44 - 1.00 mg/dL   Calcium 8.7 (L) 8.9 - 10.3 mg/dL   Phosphorus 2.0 (L) 2.5 - 4.6 mg/dL   Albumin 3.9 3.5 - 5.0 g/dL   GFR calc non Af Amer 14 (L) >60 mL/min   GFR calc Af Amer 17 (L) >60 mL/min    Comment: (NOTE) The eGFR has been calculated using the CKD EPI equation. This calculation has not been validated in all clinical situations. eGFR's persistently <60 mL/min signify possible Chronic Kidney Disease.    Anion gap 13 5 - 15    Comment: Performed at [facility], [address] [place], [place] [address]  Hepatitis B surface antigen     Status: None   Collection Time: 09/18/17 12:48 PM  Result Value Ref Range   Hepatitis B Surface Ag Negative Negative    Comment: (NOTE) Performed At: [facility] [address] [place], [place] [id number] Rush Farmer MD [id number] Performed at [facility], [address] [place],  [address]   Hepatitis B surface antibody     Status: None   Collection Time: 09/18/17 12:48 PM  Result Value Ref Range   Hep B S Ab Non Reactive     Comment: (NOTE)              Non Reactive: Inconsistent with immunity,  less than 10 mIU/mL              Reactive:     Consistent with immunity,                            greater than 9.9 mIU/mL Performed At: [facility], [place] [id number] Rush Farmer MD [id number] Performed at [facility], [address] [place], [place] [address]   Hepatitis B core antibody, total     Status: None   Collection Time: 09/18/17 12:48 PM  Result Value Ref Range   Hep B Core Total Ab Negative Negative    Comment: (NOTE) Performed At: [facility] [place], [place] [id number] Rush Farmer MD [phone] Performed at [facility], [address] [place], [place] [address]   Basic metabolic panel     Status: Abnormal   Collection Time: 09/19/17  6:39 AM  Result Value Ref Range   Sodium 140 135 - 145 mmol/L   Potassium 3.9 3.5 - 5.1 mmol/L   Chloride 97 (L) 101 - 111 mmol/L   CO2 31 22 - 32 mmol/L   Glucose, Bld 117 (H) 65 - 99 mg/dL   BUN 16 6 - 20 mg/dL   Creatinine, Ser 3.49 (H) 0.44 - 1.00 mg/dL   Calcium 9.6 8.9 - 10.3 mg/dL   GFR calc non Af Amer 12 (L) >60 mL/min   GFR calc Af Amer 14 (L) >60 mL/min    Comment: (NOTE) The eGFR has been calculated using the CKD EPI equation. This calculation has not been validated in all clinical situations. eGFR's persistently <60 mL/min signify possible Chronic Kidney Disease.    Anion gap 12 5 - 15    Comment: Performed at [facility], [address] [place], [place] [address]    Blood Alcohol level:  Lab Results  Component Value Date   [facility] <10 01/74/9449    Metabolic Disorder Labs: Lab Results  Component Value Date   HGBA1C 4.8 09/18/2017   MPG  91.06 09/18/2017   No results found for: PROLACTIN Lab Results  Component Value Date   CHOL 251 (H) 09/18/2017   TRIG 124 09/18/2017   HDL 66 09/18/2017   CHOLHDL 3.8 09/18/2017   VLDL 25 09/18/2017   LDLCALC 160 (H) 09/18/2017   LDLCALC 43 05/14/2014    Physical Findings: AIMS: Facial and Oral Movements Muscles of Facial Expression: None, normal Lips and Perioral Area: None, normal Jaw: None, normal Tongue: None, normal,Extremity Movements Upper (arms, wrists, hands, fingers): None, normal Lower (legs, knees, ankles, toes): None, normal, Trunk Movements Neck, shoulders, hips: None, normal, Overall Severity Severity of abnormal movements (highest score from questions above): None, normal Incapacitation due to abnormal movements: None, normal Patient's awareness of abnormal movements (rate only patient's report): No Awareness, Dental Status Current problems with teeth and/or dentures?: No Does patient usually wear dentures?: No  CIWA:    COWS:     Musculoskeletal: Strength & Muscle Tone: within normal limits Gait & Station: normal Patient leans: N/A  Psychiatric Specialty Exam: Physical Exam  Review of Systems  Constitutional: Negative.   HENT: Negative.   Eyes: Negative.   Respiratory: Negative.   Cardiovascular: Negative.   Gastrointestinal: Negative.   Musculoskeletal: Negative.   Skin: Negative.   Neurological: Negative.     Blood pressure (!) 136/46, pulse 71, temperature 97.9 F (36.6 C), temperature source Oral, resp. rate 18,  height 5' 3" (1.6 m), weight 72.6 kg (160 lb), SpO2 100 %.Body mass index is 28.34 kg/m.  General Appearance: Casual and Fairly Groomed  Eye Contact:  Good  Speech:  Clear and Coherent and Normal Rate  Volume:  Normal  Mood:  "OK"  Affect:  Appropriate and Congruent  Thought Process:  Coherent and Goal Directed  Orientation:  Full (Time, Place, and Person)  Thought Content:  Logical  Suicidal Thoughts:  No  Homicidal Thoughts:   No  Memory:  Immediate;   Good Recent;   Good Remote;   Good  Judgement:  Good  Insight:  Good  Psychomotor Activity:  Normal  Concentration:  Concentration: Good and Attention Span: Good  Recall:  Good  Fund of Knowledge:  Good  Language:  Good  Akathisia:  No  Handed:  Right  AIMS (if indicated):     Assets:  Communication Skills Housing Vocational/Educational  ADL's:  Intact  Cognition:  WNL  Sleep:  Number of Hours: 6     Treatment Plan Summary:    Treatment Plan Summary:   Ms. Cameron is a 72 year old female with a history of bipolar disorder and ESRD admitted for psychotic break.  #Mood/psychosis -continue Abilify 5 mg daily -continue Tegretol 200 mg BID, Tegretol level 11  #UTI -continue Keflex 500 mg BID - will have to recheck UA I na few days -UTI may have been contributing to mood symptoms at admission  #Dialyses -nephrology input is greatly appreciated - the patient will continue with dialysis - K+ was repleted  #GERD -Protonix 40 mg daily  #Restless legs -Mirapex 0.125 mg nightly  #Glaucoma -continue eye drops  #Vitamin deficiency -continue B12 and D  #Admission status -voluntary admission  #Disposition -discharge to home -follow up with Dr. Thurmond Butts     Daily contact with patient to assess and evaluate symptoms and progress in treatment and Medication management  Chauncey Mann, MD 09/19/2017, 6:10 PM

## 2017-09-19 NOTE — [facility] Suicide Risk Assessment (Signed)
[facility] INPATIENT:  Family/Significant Other Suicide Prevention Education  Suicide Prevention Education:  Patient Refusal for Family/Significant Other Suicide Prevention Education: The patient Joyce [name] has refused to provide written consent for family/significant other to be provided Family/Significant Other Suicide Prevention Education during admission and/or prior to discharge.  Physician notified.  Meika Earll  CUEBAS-COLON 09/19/2017, 4:06 PM

## 2017-09-20 MED ORDER — ARIPIPRAZOLE 5 MG PO TABS: 5.0000 mg | ORAL_TABLET | Freq: Every day | ORAL | 1 refills | Status: DC

## 2017-09-20 MED ORDER — HALOPERIDOL 1 MG PO TABS
1.0000 mg | ORAL_TABLET | Freq: Once | ORAL | Status: AC
  Administered 2017-09-20: 1 mg via ORAL
  Filled 2017-09-20: qty 1

## 2017-09-20 NOTE — Progress Notes (Signed)
Called by nursing that patient is getting paranoid. Will give Haldol 1mg  x 1

## 2017-09-20 NOTE — Progress Notes (Signed)
[place] MD Progress Note  09/20/2017 11:43 AM Joyce [name]  MRN:  [id number]   Subjective:   The patient is much more anxious today. She had some mild paranoid thoughts that she broke the wall. She was very scared and paranoid about a piece of paper regarding her Medicare rights. She did not want to sign a piece of paper. She denies any current active or passive suicidal thoughts. She denies any auditory or visual hallucinations. The patient has been eating well and does socialize some in the day room but is otherwise fairly isolative. She did not participate yesterday. She is looking forward to discharge and very much wants to leave the hospital. Blood pressure was slightly elevated this morning. Per nursing, she slept over 7 hours last night. The patient was upset that she woke up at about 5:00 this morning, early. She cannot identify any reason why she woke up early but was very upset that she did. No new somatic complaints.  Past psychiatric history. Long history of bipolar. She has been maintained on a combination of Abilify and Tegretol in the care of Dr. Thurmond Butts. There were suicides in remote past. Patient believes she has not been hospitalized in 20 years. No history of substance abuse. There is a history of pancreatitis on Depakote and possibility that treatment with Lithium contributed to kidney failure. She had good response to a combination of Abilify and Seroquel.   Family psychiatric history. None reported.  Social history. 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 If not on dialysis, she would have continued working. She has been at the same location for 30 years. Lives by herself in a trailer. Has supportive sister and church.      Principal Problem: Bipolar affective disorder, current episode manic with psychotic symptoms ([place]) Diagnosis:   Patient Active Problem List   Diagnosis Date Noted  . Bipolar affective disorder, current episode manic with  psychotic symptoms ([place]) [F31.2] 09/17/2017  . Bipolar I disorder, most recent episode (or current) manic ([place]) [F31.10] 09/02/2017  . Acute delirium [R41.0] 09/02/2017  . Altered mental status [R41.82]   . Acute encephalopathy [G93.40] 09/01/2017  . ESRD on dialysis ([place]) [N18.6, Z99.2] 04/06/2017  . Complication of vascular access for dialysis [T82.9XXA] 04/06/2017  . Hematuria [R31.9] 12/18/2016  . Polycystic kidney [Q61.3] 10/12/2016  . Ruptured cyst of kidney [Q61.00] 10/03/2016  . HTN (hypertension) [I10] 10/01/2016  . Chronic kidney disease [N18.9] 10/01/2016  . Anemia associated with chronic renal failure [D63.1] 09/24/2016  . Acute kidney insufficiency [N28.9] 07/28/2016  . Noninfectious diarrhea [K52.9]   . Benign neoplasm of cecum [D12.0]   . Benign neoplasm of ascending colon [D12.2]   . Diarrhea [R19.7]   . Nausea [R11.0]   . Gastric polyp [K31.7]   . Chronic constipation [K59.09] 12/27/2014  . Nausea with vomiting [R11.2] 12/27/2014  . Left sided abdominal pain [R10.9] 12/27/2014  . Personal history of colonic polyps [Z86.010] 05/10/2013   Total Time spent with patient: 20 minutes   Past Medical History:  Past Medical History:  Diagnosis Date  . Anal fissure   . Bipolar affective disorder ([place])   . CKD (chronic kidney disease)    Dr Holley Raring [name] 4  . Colon polyps   . Diverticulitis   . Diverticulitis   . Family history of adverse reaction to anesthesia    mom - PONV  . GERD (gastroesophageal reflux disease)   . Headache    migraines - none  over 10 yrs  . Heart murmur   . History of hiatal hernia   . Hypertension   . Pancreatitis    Valproic acid  . Vertigo     Past Surgical History:  Procedure Laterality Date  . A/V FISTULAGRAM Left 04/14/2017   Procedure: A/V Fistulagram;  Surgeon: Katha Cabal, MD;  Location: [facility];  Service: Cardiovascular;  Laterality: Left;  . A/V FISTULAGRAM Left 06/09/2017   Procedure: A/V FISTULAGRAM;   Surgeon: Katha Cabal, MD;  Location: [facility];  Service: Cardiovascular;  Laterality: Left;  . ABDOMINAL HYSTERECTOMY  1990 ?  . AV FISTULA PLACEMENT  4/30  . BREAST EXCISIONAL BIOPSY Left 1994   neg surgical bx  . CHOLECYSTECTOMY  2003  . COLONOSCOPY  2014   Dr. Jamal Collin  . COLONOSCOPY WITH PROPOFOL N/A 09/24/2015   Procedure: COLONOSCOPY WITH random colon byopies.;  Surgeon: Lucilla Lame, MD;  Location: [facility];  Service: Endoscopy;  Laterality: N/A;  . ESOPHAGOGASTRODUODENOSCOPY (EGD) WITH PROPOFOL N/A 09/24/2015   Procedure: ESOPHAGOGASTRODUODENOSCOPY (EGD) ;  Surgeon: Lucilla Lame, MD;  Location: [facility];  Service: Endoscopy;  Laterality: N/A;  . EYE SURGERY    . PERIPHERAL VASCULAR CATHETERIZATION N/A 05/29/2015   Procedure: A/V Shuntogram/Fistulagram;  Surgeon: Katha Cabal, MD;  Location: [facility];  Service: Cardiovascular;  Laterality: N/A;  . PERIPHERAL VASCULAR CATHETERIZATION N/A 05/29/2015   Procedure: A/V Shunt Intervention;  Surgeon: Katha Cabal, MD;  Location: [facility];  Service: Cardiovascular;  Laterality: N/A;  . POLYPECTOMY  09/24/2015   Procedure: POLYPECTOMY INTESTINAL;  Surgeon: Lucilla Lame, MD;  Location: [facility];  Service: Endoscopy;;  cecal polyp ascending polyp   Family History:  Family History  Problem Relation Age of Onset  . Stroke Father   . Hypertension Father   . Breast cancer Other   . Colon cancer Neg Hx   . Liver disease Neg Hx     Social History:  Social History   Substance and Sexual Activity  Alcohol Use No  . Alcohol/week: 0.0 oz     Social History   Substance and Sexual Activity  Drug Use No    Social History   Socioeconomic History  . Marital status: Single    Spouse name: None  . Number of children: 1  . Years of education: None  . Highest education level: None  Social Needs  . Financial resource strain: None  . Food insecurity - worry: None  .  Food insecurity - inability: None  . Transportation needs - medical: None  . Transportation needs - non-medical: None  Occupational History  . Occupation: Surveyor, quantity: [employer]  Tobacco Use  . Smoking status: Never Smoker  . Smokeless tobacco: Never Used  Substance and Sexual Activity  . Alcohol use: No    Alcohol/week: 0.0 oz  . Drug use: No  . Sexual activity: Not Currently  Other Topics Concern  . 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   Additional Social History:    History of alcohol / drug use?: No history of alcohol / drug abuse        Sleep: Fair  Appetite:  Good  Current Medications: Current Facility-Administered Medications  Medication Dose Route Frequency Provider Last Rate Last Dose  . acetaminophen (TYLENOL) tablet 325 mg  325 mg Oral BID PRN  Clapacs, [name], MD      . acetaminophen (TYLENOL) tablet 650 mg  650 mg Oral Q6H PRN Clapacs, [name], MD      . alum & mag hydroxide-simeth (MAALOX/MYLANTA) 200-200-20 MG/5ML suspension 30 mL  30 mL Oral Q4H PRN Clapacs, [name], MD      . ARIPiprazole (ABILIFY) tablet 5 mg  5 mg Oral Daily Clapacs, [name], MD   5 mg at 09/20/17 0859  . bisacodyl (DULCOLAX) EC tablet 5 mg  5 mg Oral Daily PRN Clapacs, [name], MD      . brimonidine (ALPHAGAN) 0.2 % ophthalmic solution 1 drop  1 drop Both Eyes BID Clapacs, [name], MD   1 drop at 09/20/17 0900  . carbamazepine (TEGRETOL) tablet 200 mg  200 mg Oral BID Clapacs, [name], MD   200 mg at 09/20/17 0859  . cephALEXin (KEFLEX) capsule 500 mg  500 mg Oral Q12H Clapacs, [name], MD   500 mg at 09/20/17 0859  . cholecalciferol (VITAMIN D) tablet 1,000 Units  1,000 Units Oral Daily Clapacs, [name], MD   1,000 Units at 09/20/17 0859  . [START ON 10/17/2017] cyanocobalamin ((VITAMIN B-12)) injection 1,000 mcg  1,000 mcg Intramuscular Q30 days Clapacs, [name], MD      . dicyclomine (BENTYL) tablet 20  mg  20 mg Oral TID AC Clapacs, [name], MD   20 mg at 09/19/17 1404  . lidocaine-prilocaine (EMLA) cream 1 application  1 application Topical Q M,W,F Clapacs, [name], MD      . magnesium hydroxide (MILK OF MAGNESIA) suspension 30 mL  30 mL Oral Daily PRN Clapacs, [name], MD      . midodrine (PROAMATINE) tablet 10 mg  10 mg Oral Daily Clapacs, [name], MD   10 mg at 09/19/17 0841  . pantoprazole (PROTONIX) EC tablet 40 mg  40 mg Oral Daily Clapacs, [name], MD   40 mg at 09/19/17 0841  . pramipexole (MIRAPEX) tablet 0.125 mg  0.125 mg Oral Q1400 Clapacs, [name], MD   0.125 mg at 09/19/17 1404  . timolol (TIMOPTIC) 0.5 % ophthalmic solution 1 drop  1 drop Both Eyes BID Clapacs, [name], MD   1 drop at 09/20/17 0900    Lab Results:  Results for orders placed or performed during the hospital encounter of 09/17/17 (from the past 48 hour(s))  Renal function panel     Status: Abnormal   Collection Time: 09/18/17 12:48 PM  Result Value Ref Range   Sodium 140 135 - 145 mmol/L   Potassium 2.8 (L) 3.5 - 5.1 mmol/L   Chloride 101 101 - 111 mmol/L   CO2 26 22 - 32 mmol/L   Glucose, Bld 117 (H) 65 - 99 mg/dL   BUN 23 (H) 6 - 20 mg/dL   Creatinine, Ser 3.08 (H) 0.44 - 1.00 mg/dL   Calcium 8.7 (L) 8.9 - 10.3 mg/dL   Phosphorus 2.0 (L) 2.5 - 4.6 mg/dL   Albumin 3.9 3.5 - 5.0 g/dL   GFR calc non Af Amer 14 (L) >60 mL/min   GFR calc Af Amer 17 (L) >60 mL/min    Comment: (NOTE) The eGFR has been calculated using the CKD EPI equation. This calculation has not been validated in all clinical situations. eGFR's persistently <60 mL/min signify possible Chronic Kidney Disease.    Anion gap 13 5 - 15    Comment: Performed at [facility], [place],  [place] [address]  Hepatitis B surface antigen     Status: None   Collection Time: 09/18/17 12:48 PM  Result Value Ref Range   Hepatitis B Surface Ag Negative Negative    Comment: (NOTE) Performed At: [facility] [address]  [place], [place] [id number] Rush Farmer MD [id number] Performed at [facility], [address] [place], [place] [address]   Hepatitis B surface antibody     Status: None   Collection Time: 09/18/17 12:48 PM  Result Value Ref Range   Hep B S Ab Non Reactive     Comment: (NOTE)              Non Reactive: Inconsistent with immunity,                            less than 10 mIU/mL              Reactive:     Consistent with immunity,                            greater than 9.9 mIU/mL Performed At: [facility] [place], [place] [id number] Rush Farmer MD [id number] Performed at [facility], [address] [place], [place] [address]   Hepatitis B core antibody, total     Status: None   Collection Time: 09/18/17 12:48 PM  Result Value Ref Range   Hep B Core Total Ab Negative Negative    Comment: (NOTE) Performed At: [facility] [place], [place] [id number] Rush Farmer MD [phone] Performed at [facility], [address] [place], [place] [address]   Basic metabolic panel     Status: Abnormal   Collection Time: 09/19/17  6:39 AM  Result Value Ref Range   Sodium 140 135 - 145 mmol/L   Potassium 3.9 3.5 - 5.1 mmol/L   Chloride 97 (L) 101 - 111 mmol/L   CO2 31 22 - 32 mmol/L   Glucose, Bld 117 (H) 65 - 99 mg/dL   BUN 16 6 - 20 mg/dL   Creatinine, Ser 3.49 (H) 0.44 - 1.00 mg/dL   Calcium 9.6 8.9 - 10.3 mg/dL   GFR calc non Af Amer 12 (L) >60 mL/min   GFR calc Af Amer 14 (L) >60 mL/min    Comment: (NOTE) The eGFR has been calculated using the CKD EPI equation. This calculation has not been validated in all clinical situations. eGFR's persistently <60 mL/min signify possible Chronic Kidney Disease.    Anion gap 12 5 - 15    Comment: Performed at [facility], [address] [place], [place] [address]    Blood Alcohol level:  Lab Results  Component Value Date   [facility] <10  93/79/0240    Metabolic Disorder Labs: Lab Results  Component Value Date   HGBA1C 4.8 09/18/2017   MPG 91.06 09/18/2017   No results found for: PROLACTIN Lab Results  Component Value Date   CHOL 251 (H) 09/18/2017   TRIG 124 09/18/2017   HDL 66 09/18/2017   CHOLHDL 3.8 09/18/2017   VLDL 25 09/18/2017   LDLCALC 160 (H) 09/18/2017   LDLCALC 43 05/14/2014    Physical Findings: AIMS: Facial and Oral Movements Muscles of Facial Expression: None, normal Lips and Perioral Area: None, normal Jaw: None, normal Tongue: None, normal,Extremity Movements Upper (arms, wrists, hands, fingers): None, normal Lower (legs,  knees, ankles, toes): None, normal, Trunk Movements Neck, shoulders, hips: None, normal, Overall Severity Severity of abnormal movements (highest score from questions above): None, normal Incapacitation due to abnormal movements: None, normal Patient's awareness of abnormal movements (rate only patient's report): No Awareness, Dental Status Current problems with teeth and/or dentures?: No Does patient usually wear dentures?: No  CIWA:    COWS:     Musculoskeletal: Strength & Muscle Tone: within normal limits Gait & Station: normal Patient leans: N/A  Psychiatric Specialty Exam: Physical Exam  Psychiatric: Her speech is normal and behavior is normal. Judgment normal. Her mood appears anxious. Thought content is paranoid. Cognition and memory are normal.  Paranoid about breaking the law. Very anxious this morning    Review of Systems  Constitutional: Negative.   HENT: Negative.   Eyes: Negative.   Respiratory: Negative.   Cardiovascular: Negative.   Gastrointestinal: Negative.   Musculoskeletal: Negative.   Skin: Negative.   Neurological: Negative.     Blood pressure (!) 167/71, pulse 92, temperature 98 F (36.7 C), temperature source Oral, resp. rate 18, height '5\' 3"'$  (1.6 m), weight 72.6 kg (160 lb), SpO2 100 %.Body mass index is 28.34 kg/m.  General  Appearance: Casual and Fairly Groomed  Eye Contact:  Good  Speech:  Clear and Coherent and Normal Rate  Volume:  Normal  Mood:  Anxious  Affect:  Anxious and nervous  Thought Process:  Coherent and Goal Directed  Orientation:  Full (Time, Place, and Person)  Thought Content:  Logical  Suicidal Thoughts:  No  Homicidal Thoughts:  No  Memory:  Immediate;   Good Recent;   Good Remote;   Good  Judgement:  Good  Insight:  Good  Psychomotor Activity:  Normal  Concentration:  Concentration: Good and Attention Span: Good  Recall:  Good  Fund of Knowledge:  Good  Language:  Good  Akathisia:  No  Handed:  Right  AIMS (if indicated):     Assets:  Communication Skills Housing Vocational/Educational  ADL's:  Intact  Cognition:  WNL  Sleep:  Number of Hours: 7.45      Treatment Plan Summary:   Ms. Trillo is a 72 year old female with a history of bipolar disorder and ESRD admitted for psychotic break.  #Mood/psychosis -continue Abilify 5 mg daily -continue Tegretol 200 mg BID, Tegretol level 11  #UTI -continue Keflex 500 mg BID -UTI may have been contributing to mood symptoms at admission. Will repeat UA today due to more confusion  #Dialyses -nephrology input is greatly appreciated - the patient will continue with dialysis - K+ was repleted  #GERD -Protonix 40 mg daily  #Restless legs -Mirapex 0.125 mg nightly  #Glaucoma -continue eye drops  #Vitamin deficiency -continue B12 and D  #Admission status -voluntary admission  #Disposition -discharge to home -follow up with Dr. Thurmond Butts     Daily contact with patient to assess and evaluate symptoms and progress in treatment and Medication management  Chauncey Mann, MD 09/20/2017, 11:43 AM

## 2017-09-20 NOTE — [facility] Group Notes (Signed)
LCSW Group Therapy Note 09/20/2017 1:15pm  Type of Therapy and Topic: Group Therapy: Feelings Around Returning Home & Establishing a Supportive Framework and Supporting Oneself When Supports Not Available  Participation Level: Minimal  Description of Group:  Patients first processed thoughts and feelings about upcoming discharge. These included fears of upcoming changes, lack of change, new living environments, judgements and expectations from others and overall stigma of mental health issues. The group then discussed the definition of a supportive framework, what that looks and feels like, and how do to discern it from an unhealthy non-supportive network. The group identified different types of supports as well as what to do when your family/friends are less than helpful or unavailable  Therapeutic Goals  1. Patient will identify one healthy supportive network that they can use at discharge. 2. Patient will identify one factor of a supportive framework and how to tell it from an unhealthy network. 3. Patient able to identify one coping skill to use when they do not have positive supports from others. 4. Patient will demonstrate ability to communicate their needs through discussion and/or role plays.  Summary of Patient Progress:  Pt scored her mood 5 (10 best). Pt engaged during group session. As patients processed their anxiety about discharge and described healthy supports patient shared she does not have a support system that she lost everyone. She stated that she does not know what else to say, that is hard to deal with Bipolar disorder.     Therapeutic Modalities Cognitive Behavioral Therapy Motivational Interviewing   Charlyne Robertshaw  CUEBAS-COLON, LCSW 09/20/2017 10:34 AM

## 2017-09-20 NOTE — Progress Notes (Signed)
D:Pt denies SI/HI/AVH. Pt. Denies pain this evening. Pt. Does not present with psychotic features this evening. Pt. Reports doing, "good" this evening. Pt appetite is good. Pt. States she can remain safe while on the unit. Pt. Is able to verbalize she can remain safe while on the unit.Pt. Calm and cooperative upon assessment. Pt. Has no complaints this evening. Pt. Observed this evening frequently isolative and withdrawn to her room. Pt. Attending to ADLs appropriately and engages appropriately during assessment. Pt. Sleeping good. Pt. Frequently isolative and withdrawn.   A: Q x 15 minute observation checks were completed for safety. Patient was provided with education. Pt. Verbalizes understanding of provided education. Patient was given scheduled medications. Patient was encourage to attend groups, participate in unit activities and continue with plan of care.   R:Patient is complaint with medication and unit procedures. Pt. Does not Participate in groups.             Precautionary checks every 15 minutes for safety maintained, room free of safety hazards, patient sustains no injury or falls during this shift.

## 2017-09-20 NOTE — Plan of Care (Signed)
Pt. Denies pain this evening. Pt. Does not present with psychotic features this evening. Pt. Verbalizes understanding of provided education. Pt. Reports doing, "good" this evening. Pt. Compliant with medications. Pt. Does not Participate in groups and unit activities. Pt eating adequately. Pt. Denies SI/HI. Pt. States she can remain safe while on the unit. Pt. Is able to verbalize she can remain safe while on the unit.    Progressing Pain Managment: General experience of comfort will improve 09/20/2017 0147 - Progressing by Joyce Ivan, RN Education: Will be free of psychotic symptoms 09/20/2017 0147 - Progressing by Joyce Ivan, RN Knowledge of the prescribed therapeutic regimen will improve 09/20/2017 0147 - Progressing by Joyce Ivan, RN Coping: Ability to cope will improve 09/20/2017 0147 - Progressing by Joyce Ivan, RN Ability to verbalize feelings will improve 09/20/2017 0147 - Progressing by Joyce Ivan, RN Health Behavior/Discharge Planning: Compliance with prescribed medication regimen will improve 09/20/2017 0147 - Progressing by Joyce Ivan, RN Nutritional: Ability to achieve adequate nutritional intake will improve 09/20/2017 0147 - Progressing by Joyce Ivan, RN Safety: Ability to remain free from injury will improve 09/20/2017 0147 - Progressing by Joyce Ivan, RN Self-Concept: Ability to verbalize positive feelings about self will improve 09/20/2017 0147 - Progressing by Joyce Ivan, RN

## 2017-09-20 NOTE — Progress Notes (Signed)
D- Patient alert and oriented. Patient presents in a pleasant mood stating that she "didn't sleep" last night because of "confusing thoughts". Patient denies SI, HI, AVH, and pain at this time. Patient denies any signs/symptoms of depression and anxiety at this time and stated to this writer "I just want to talk to somebody" referencing the doctor".  A- Scheduled medications administered to patient, per MD orders. Support and encouragement provided.  Routine safety checks conducted every 15 minutes.  Patient informed to notify staff with problems or concerns.  R- No adverse drug reactions noted. Patient contracts for safety at this time. Patient compliant with medications and treatment plan. Patient receptive, calm, and cooperative. Patient interacts well with others on the unit.  Patient remains safe at this time.

## 2017-09-21 LAB — URINALYSIS, COMPLETE (UACMP) WITH MICROSCOPIC
Bilirubin Urine: NEGATIVE
GLUCOSE, UA: NEGATIVE mg/dL
Hgb urine dipstick: NEGATIVE
Ketones, ur: NEGATIVE mg/dL
Leukocytes, UA: NEGATIVE
Nitrite: NEGATIVE
PH: 7 (ref 5.0–8.0)
Protein, ur: NEGATIVE mg/dL
Specific Gravity, Urine: 1.005 (ref 1.005–1.030)

## 2017-09-21 MED ORDER — QUETIAPINE FUMARATE 100 MG PO TABS
100.0000 mg | ORAL_TABLET | Freq: Every day | ORAL | Status: DC
  Administered 2017-09-21: 100 mg via ORAL
  Filled 2017-09-21: qty 1

## 2017-09-21 MED ORDER — ENSURE ENLIVE PO LIQD
237.0000 mL | Freq: Three times a day (TID) | ORAL | Status: DC
  Administered 2017-09-21 – 2017-09-24 (×4): 237 mL via ORAL

## 2017-09-21 MED ORDER — QUETIAPINE FUMARATE 25 MG PO TABS
50.0000 mg | ORAL_TABLET | Freq: Once | ORAL | Status: AC
  Administered 2017-09-21: 50 mg via ORAL
  Filled 2017-09-21: qty 2

## 2017-09-21 NOTE — Progress Notes (Signed)
HD tx end  

## 2017-09-21 NOTE — Progress Notes (Signed)
HD tx start 

## 2017-09-21 NOTE — Progress Notes (Signed)
[facility]  ROUNDING NOTE   Subjective:  Patient still appears to be a bit paranoid today. She will be due for hemodialysis later today.   Objective:  Vital signs in last 24 hours:  Temp:  [97.8 F (36.6 C)-97.9 F (36.6 C)] 97.9 F (36.6 C) (03/04 0634) Pulse Rate:  [86-90] 90 (03/04 0634) Resp:  [16-18] 18 (03/04 0634) BP: (153-154)/(65-92) 153/92 (03/04 0634) SpO2:  [97 %] 97 % (03/04 0634)  Weight change:  Filed Weights   09/18/17 0215 09/18/17 1535  Weight: 72.6 kg (160 lb) 72.6 kg (160 lb)    Intake/Output: I/O last 3 completed shifts: In: 840 [P.O.:840] Out: -    Intake/Output this shift:  No intake/output data recorded.  Physical Exam: General: NAD, sitting in chair  Head: Normocephalic, atraumatic. Moist oral mucosal membranes  Eyes: Anicteric  Neck: Supple, trachea midline  Lungs:  Clear to auscultation  Heart: Regular rate and rhythm  Abdomen:  Soft, nontender  Extremities: no peripheral edema.  Neurologic: Nonfocal, moving all four extremities  Skin: No lesions  Access: Left AVF    Basic Metabolic Panel: Recent Labs  Lab 09/16/17 1908 09/18/17 1248 09/19/17 0639  NA 139 140 140  K 2.8* 2.8* 3.9  CL 100* 101 97*  CO2 25 26 31   GLUCOSE 115* 117* 117*  BUN 26* 23* 16  CREATININE 4.72* 3.08* 3.49*  CALCIUM 9.8 8.7* 9.6  PHOS  --  2.0*  --     Liver Function Tests: Recent Labs  Lab 09/16/17 1908 09/18/17 1248  AST 22  --   ALT 12*  --   ALKPHOS 58  --   BILITOT 0.8  --   PROT 7.7  --   ALBUMIN 4.5 3.9   No results for input(s): LIPASE, AMYLASE in the last 168 hours. No results for input(s): AMMONIA in the last 168 hours.  CBC: Recent Labs  Lab 09/16/17 1908  WBC 7.7  HGB 12.4  HCT 36.9  MCV 95.4  PLT 305    Cardiac Enzymes: No results for input(s): CKTOTAL, CKMB, CKMBINDEX, TROPONINI in the last 168 hours.  BNP: Invalid input(s): POCBNP  CBG: No results for input(s): GLUCAP in the last 168  hours.  Microbiology: Results for orders placed or performed during the hospital encounter of 09/01/17  Urine culture     Status: Abnormal   Collection Time: 09/01/17  4:32 PM  Result Value Ref Range Status   Specimen Description   Final    URINE, RANDOM Performed at [facility], [address], [place], [place] [address]    Special Requests   Final    NONE Performed at [facility], [address] [place], [place] [address]    Culture MULTIPLE SPECIES PRESENT, SUGGEST RECOLLECTION (A)  Final   Report Status 09/03/2017 FINAL  Final  Culture, blood (routine x 2)     Status: None   Collection Time: 09/01/17  6:05 PM  Result Value Ref Range Status   Specimen Description BLOOD RIGHT HAND  Final   Special Requests   Final    BOTTLES DRAWN AEROBIC AND ANAEROBIC Blood Culture results may not be optimal due to an excessive volume of blood received in culture bottles   Culture   Final    NO GROWTH 5 DAYS Performed at [facility], [address], [place], [place] [address]    Report Status 09/06/2017 FINAL  Final  Culture, blood (routine x 2)     Status:  None   Collection Time: 09/01/17  6:34 PM  Result Value Ref Range Status   Specimen Description BLOOD RAC  Final   Special Requests   Final    BOTTLES DRAWN AEROBIC AND ANAEROBIC Blood Culture adequate volume   Culture   Final    NO GROWTH 5 DAYS Performed at [facility], [address] [place], [place] [address]    Report Status 09/06/2017 FINAL  Final    Coagulation Studies: No results for input(s): LABPROT, INR in the last 72 hours.  Urinalysis: Recent Labs    09/20/17 0000  COLORURINE YELLOW*  LABSPEC 1.005  PHURINE 7.0  GLUCOSEU NEGATIVE  HGBUR NEGATIVE  BILIRUBINUR NEGATIVE  KETONESUR NEGATIVE  PROTEINUR NEGATIVE  NITRITE NEGATIVE  LEUKOCYTESUR NEGATIVE      Imaging: No results found.   Medications:    . ARIPiprazole  5 mg Oral Daily  . brimonidine  1 drop  Both Eyes BID  . carbamazepine  200 mg Oral BID  . cephALEXin  500 mg Oral Q12H  . cholecalciferol  1,000 Units Oral Daily  . [START ON 10/17/2017] cyanocobalamin  1,000 mcg Intramuscular Q30 days  . dicyclomine  20 mg Oral TID AC  . lidocaine-prilocaine  1 application Topical Q M,W,F  . midodrine  10 mg Oral Daily  . pantoprazole  40 mg Oral Daily  . pramipexole  0.125 mg Oral Q1400  . timolol  1 drop Both Eyes BID   acetaminophen, acetaminophen, alum & mag hydroxide-simeth, bisacodyl, magnesium hydroxide  Assessment/ Plan:  [name] is a 72 y.o. white female with end stage renal disease on hemodialysis, diverticulosis, hypotension, bipolar disorder, polycystic kidney disease   [facility]/MWF  1.  ESRD on HD MWF.  Patient due for dialysis today, orders have been prepared.  2.  Anemia of CKD: Hold off on epogen at this time.   3.  Secondary hyperparathyroidism. Phos at 2 at last check, continue to monitor.   4. Hypertension: continue midodrine prior to dialysis.    LOS: 4 Tyton Abdallah 3/4/201911:14 AM

## 2017-09-21 NOTE — Progress Notes (Signed)
Received Joyce [name] this am in her room asleep, she woke up on her own. She is disorganized with thought blocking. Paranoid about taking her medications, but eventually was compliant. She is waiting to be called for dialysis.

## 2017-09-21 NOTE — Progress Notes (Signed)
Pre HD assessment  

## 2017-09-21 NOTE — Progress Notes (Signed)
[facility] MD Progress Note  09/21/2017 11:31 AM Joyce [name]  MRN:  [id number]  Subjective:  Joyce [name] did not haver a good weekend. She has been increasingly paranoid and disorganized. She takes medications with outmost encouragement. She seems confused. She could not understand that she needs to go to dialysis today. Monday is her regular day. She did not sleep or eat well.   Treatment plan. We continue Tegretol, Abilify for bipolar. She was given haldol last night for agitation.  Social/disposition. She will return tom and follow up with Dr. Thurmond Butts  Principal Problem: Bipolar affective disorder, current episode manic with psychotic symptoms ([place]) Diagnosis:   Patient Active Problem List   Diagnosis Date Noted  . Bipolar affective disorder, current episode manic with psychotic symptoms ([place]) [F31.2] 09/17/2017    Priority: High  . Bipolar I disorder, most recent episode (or current) manic ([place]) [F31.10] 09/02/2017  . Acute delirium [R41.0] 09/02/2017  . Altered mental status [R41.82]   . Acute encephalopathy [G93.40] 09/01/2017  . ESRD on dialysis ([place]) [N18.6, Z99.2] 04/06/2017  . Complication of vascular access for dialysis [T82.9XXA] 04/06/2017  . Hematuria [R31.9] 12/18/2016  . Polycystic kidney [Q61.3] 10/12/2016  . Ruptured cyst of kidney [Q61.00] 10/03/2016  . HTN (hypertension) [I10] 10/01/2016  . Chronic kidney disease [N18.9] 10/01/2016  . Anemia associated with chronic renal failure [D63.1] 09/24/2016  . Acute kidney insufficiency [N28.9] 07/28/2016  . Noninfectious diarrhea [K52.9]   . Benign neoplasm of cecum [D12.0]   . Benign neoplasm of ascending colon [D12.2]   . Diarrhea [R19.7]   . Nausea [R11.0]   . Gastric polyp [K31.7]   . Chronic constipation [K59.09] 12/27/2014  . Nausea with vomiting [R11.2] 12/27/2014  . Left sided abdominal pain [R10.9] 12/27/2014  . Personal history of colonic polyps [Z86.010] 05/10/2013   Total Time spent with patient: 20  minutes  Past Psychiatric History: bipolar disorder  Past Medical History:  Past Medical History:  Diagnosis Date  . Anal fissure   . Bipolar affective disorder ([place])   . CKD (chronic kidney disease)    Dr Holley Raring [name] 4  . Colon polyps   . Diverticulitis   . Diverticulitis   . Family history of adverse reaction to anesthesia    mom - PONV  . GERD (gastroesophageal reflux disease)   . Headache    migraines - none over 10 yrs  . Heart murmur   . History of hiatal hernia   . Hypertension   . Pancreatitis    Valproic acid  . Vertigo     Past Surgical History:  Procedure Laterality Date  . A/V FISTULAGRAM Left 04/14/2017   Procedure: A/V Fistulagram;  Surgeon: Katha Cabal, MD;  Location: [facility];  Service: Cardiovascular;  Laterality: Left;  . A/V FISTULAGRAM Left 06/09/2017   Procedure: A/V FISTULAGRAM;  Surgeon: Katha Cabal, MD;  Location: [facility];  Service: Cardiovascular;  Laterality: Left;  . ABDOMINAL HYSTERECTOMY  1990 ?  . AV FISTULA PLACEMENT  4/30  . BREAST EXCISIONAL BIOPSY Left 1994   neg surgical bx  . CHOLECYSTECTOMY  2003  . COLONOSCOPY  2014   Dr. Jamal Collin  . COLONOSCOPY WITH PROPOFOL N/A 09/24/2015   Procedure: COLONOSCOPY WITH random colon byopies.;  Surgeon: Lucilla Lame, MD;  Location: [facility];  Service: Endoscopy;  Laterality: N/A;  . ESOPHAGOGASTRODUODENOSCOPY (EGD) WITH PROPOFOL N/A 09/24/2015   Procedure: ESOPHAGOGASTRODUODENOSCOPY (EGD) ;  Surgeon: Lucilla Lame, MD;  Location: [facility];  Service: Endoscopy;  Laterality: N/A;  . EYE SURGERY    . PERIPHERAL VASCULAR CATHETERIZATION N/A 05/29/2015   Procedure: A/V Shuntogram/Fistulagram;  Surgeon: Katha Cabal, MD;  Location: [facility] [facility];  Service: Cardiovascular;  Laterality: N/A;  . PERIPHERAL VASCULAR CATHETERIZATION N/A 05/29/2015   Procedure: A/V Shunt Intervention;  Surgeon: Katha Cabal, MD;  Location: [place] [facility];   Service: Cardiovascular;  Laterality: N/A;  . POLYPECTOMY  09/24/2015   Procedure: POLYPECTOMY INTESTINAL;  Surgeon: Lucilla Lame, MD;  Location: [place];  Service: Endoscopy;;  cecal polyp ascending polyp   Family History:  Family History  Problem Relation Age of Onset  . Stroke Father   . Hypertension Father   . Breast cancer Other   . Colon cancer Neg Hx   . Liver disease Neg Hx    Family Psychiatric  History: none reported Social History:  Social History   Substance and Sexual Activity  Alcohol Use No  . Alcohol/week: 0.0 oz     Social History   Substance and Sexual Activity  Drug Use No    Social History   Socioeconomic History  . Marital status: Single    Spouse name: None  . Number of children: 1  . Years of education: None  . Highest education level: None  Social Needs  . Financial resource strain: None  . Food insecurity - worry: None  . Food insecurity - inability: None  . Transportation needs - medical: None  . Transportation needs - non-medical: None  Occupational History  . Occupation: Surveyor, quantity: [employer]  Tobacco Use  . Smoking status: Never Smoker  . Smokeless tobacco: Never Used  Substance and Sexual Activity  . Alcohol use: No    Alcohol/week: 0.0 oz  . Drug use: No  . Sexual activity: Not Currently  Other Topics Concern  . 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   Additional Social History:    History of alcohol / drug use?: No history of alcohol / drug abuse                    Sleep: Poor  Appetite:  Poor  Current Medications: Current Facility-Administered Medications  Medication Dose Route Frequency Provider Last Rate Last Dose  . acetaminophen (TYLENOL) tablet 325 mg  325 mg Oral BID PRN Clapacs, [name], MD      . acetaminophen (TYLENOL) tablet 650 mg  650 mg Oral Q6H PRN Clapacs, [name], MD      . alum & mag  hydroxide-simeth (MAALOX/MYLANTA) 200-200-20 MG/5ML suspension 30 mL  30 mL Oral Q4H PRN Clapacs, [name], MD      . ARIPiprazole (ABILIFY) tablet 5 mg  5 mg Oral Daily Clapacs, [name], MD   5 mg at 09/20/17 0859  . bisacodyl (DULCOLAX) EC tablet 5 mg  5 mg Oral Daily PRN Clapacs, [name], MD      . brimonidine (ALPHAGAN) 0.2 % ophthalmic solution 1 drop  1 drop Both Eyes BID Clapacs, [name], MD   1 drop at 09/20/17 1609  . carbamazepine (TEGRETOL) tablet 200 mg  200 mg Oral BID Clapacs, [name], MD   200 mg at 09/20/17 1611  . cephALEXin (KEFLEX) capsule 500 mg  500 mg Oral Q12H Clapacs, [name], MD   500 mg at 09/20/17 2026  . cholecalciferol (VITAMIN D)  tablet 1,000 Units  1,000 Units Oral Daily Clapacs, [name], MD   1,000 Units at 09/20/17 [phone]  . [START ON 10/17/2017] cyanocobalamin ((VITAMIN B-12)) injection 1,000 mcg  1,000 mcg Intramuscular Q30 days Clapacs, [name], MD      . dicyclomine (BENTYL) tablet 20 mg  20 mg Oral TID AC Clapacs, [name], MD   20 mg at 09/19/17 1404  . lidocaine-prilocaine (EMLA) cream 1 application  1 application Topical Q M,W,F Clapacs, [name], MD      . magnesium hydroxide (MILK OF MAGNESIA) suspension 30 mL  30 mL Oral Daily PRN Clapacs, [name], MD      . midodrine (PROAMATINE) tablet 10 mg  10 mg Oral Daily Clapacs, [name], MD   10 mg at 09/19/17 0841  . pantoprazole (PROTONIX) EC tablet 40 mg  40 mg Oral Daily Clapacs, [name], MD   40 mg at 09/19/17 0841  . pramipexole (MIRAPEX) tablet 0.125 mg  0.125 mg Oral Q1400 Clapacs, [name], MD   0.125 mg at 09/19/17 1404  . timolol (TIMOPTIC) 0.5 % ophthalmic solution 1 drop  1 drop Both Eyes BID Clapacs, [name], MD   1 drop at 09/20/17 1609    Lab Results:  Results for orders placed or performed during the hospital encounter of 09/17/17 (from the past 48 hour(s))  Urinalysis, Complete w Microscopic     Status: Abnormal   Collection Time: 09/20/17 12:00 AM  Result Value Ref Range   Color, Urine YELLOW (A) YELLOW    APPearance CLEAR (A) CLEAR   Specific Gravity, Urine 1.005 1.005 - 1.030   pH 7.0 5.0 - 8.0   Glucose, UA NEGATIVE NEGATIVE mg/dL   Hgb urine dipstick NEGATIVE NEGATIVE   Bilirubin Urine NEGATIVE NEGATIVE   Ketones, ur NEGATIVE NEGATIVE mg/dL   Protein, ur NEGATIVE NEGATIVE mg/dL   Nitrite NEGATIVE NEGATIVE   Leukocytes, UA NEGATIVE NEGATIVE   RBC / HPF 0-5 0 - 5 RBC/hpf   WBC, UA 0-5 0 - 5 WBC/hpf   Bacteria, UA RARE (A) NONE SEEN   Squamous Epithelial / LPF 0-5 (A) NONE SEEN    Comment: Performed at [facility], [address] [place], [place] [address]    Blood Alcohol level:  Lab Results  Component Value Date   [facility] <10 16/07/930    Metabolic Disorder Labs: Lab Results  Component Value Date   HGBA1C 4.8 09/18/2017   MPG 91.06 09/18/2017   No results found for: PROLACTIN Lab Results  Component Value Date   CHOL 251 (H) 09/18/2017   TRIG 124 09/18/2017   HDL 66 09/18/2017   CHOLHDL 3.8 09/18/2017   VLDL 25 09/18/2017   LDLCALC 160 (H) 09/18/2017   LDLCALC 43 05/14/2014    Physical Findings: AIMS: Facial and Oral Movements Muscles of Facial Expression: None, normal Lips and Perioral Area: None, normal Jaw: None, normal Tongue: None, normal,Extremity Movements Upper (arms, wrists, hands, fingers): None, normal Lower (legs, knees, ankles, toes): None, normal, Trunk Movements Neck, shoulders, hips: None, normal, Overall Severity Severity of abnormal movements (highest score from questions above): None, normal Incapacitation due to abnormal movements: None, normal Patient's awareness of abnormal movements (rate only patient's report): No Awareness, Dental Status Current problems with teeth and/or dentures?: No Does patient usually wear dentures?: No  CIWA:    COWS:     Musculoskeletal: Strength & Muscle Tone: within normal limits Gait & Station: normal Patient leans: N/A  Psychiatric Specialty Exam: Physical  Exam  Nursing note and vitals  reviewed. Psychiatric: Her speech is normal. Judgment normal. Her affect is blunt. She is withdrawn. Thought content is paranoid and delusional. Cognition and memory are impaired.    Review of Systems  Neurological: Negative.   Psychiatric/Behavioral: Positive for hallucinations. The patient has insomnia.   All other systems reviewed and are negative.   Blood pressure (!) 153/92, pulse 90, temperature 97.9 F (36.6 C), temperature source Oral, resp. rate 18, height 5\' 3"  (1.6 m), weight 72.6 kg (160 lb), SpO2 97 %.Body mass index is 28.34 kg/m.  General Appearance: Casual  Eye Contact:  Good  Speech:  Clear and Coherent  Volume:  Normal  Mood:  Anxious  Affect:  Blunt  Thought Process:  Disorganized and Descriptions of Associations: Tangential  Orientation:  Full (Time, Place, and Person)  Thought Content:  Delusions and Paranoid Ideation  Suicidal Thoughts:  No  Homicidal Thoughts:  No  Memory:  Immediate;   Fair Recent;   Fair Remote;   Fair  Judgement:  Poor  Insight:  Lacking  Psychomotor Activity:  Normal  Concentration:  Concentration: Fair and Attention Span: Fair  Recall:  [employer] of Knowledge:  Fair  Language:  Fair  Akathisia:  No  Handed:  Right  AIMS (if indicated):     Assets:  Communication Skills Desire for Improvement Financial Resources/Insurance Housing Resilience Social Support  ADL's:  Intact  Cognition:  WNL  Sleep:  Number of Hours: 4.45     Treatment Plan Summary: Daily contact with patient to assess and evaluate symptoms and progress in treatment and Medication management   Joyce [name] is a 72 year old female with a history of bipolar disorder and ESRD admitted for psychotic break.  #Mood/psychosis -continue Abilify 5 mg daily -continue Tegretol 200 mg BID, Tegretol level 11  #UTI -continue Keflex 500 mg BID -UTI may have been contributing to mood symptoms at admission. Repeat UA is no longer suggestive of  UTI  #Dialyses -nephrology input is greatly appreciated - the patient will continue with dialysis  #GERD -Protonix 40 mg daily  #Restless legs -Mirapex 0.125 mg nightly  #Glaucoma -continue eye drops  #Vitamin deficiency -continue B12 and D -vit B12 level  #Admission status -voluntary admission  #Disposition -discharge to home -follow up with Dr. Bryna Colander, MD 09/21/2017, 11:31 AM

## 2017-09-21 NOTE — Plan of Care (Signed)
Pt. Compliant with medications this evening, but extremely apprehensive about taking medications ordered for her and very questioning of why she needs to take them and asks various other paranoid questions of staffs intentions towards giving her medications. Pt. Able to remain safe while on the unit. Pt. Has no si/hi ideations verbalizes and denies. Pt. Presents overall this evening very: suspicious of staff/peers/surroundings, worried, guarded, constricted, and nervous. Pt throughout the shift had expressed several statements that are evidence of potentially responding to visual hallucinations and severe paranoia. Pt. 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 Pt. Frequently spent part of the night peering out her door and pacing inside her room looking out towards staff. Pt. Needs reinforcement on education provided, due to ,level of paranoia. Pt. Hardly had anything to eat today and was very isolative and withdrawn. Pt. Unable to verbalize and engage with this writer effectively this evening.    Progressing Health Behavior/Discharge Planning: Compliance with prescribed medication regimen will improve 09/21/2017 0231 - Progressing by Reyes Ivan, RN Safety: Ability to remain free from injury will improve 09/21/2017 0231 - Progressing by Reyes Ivan, RN   Not Progressing Education: Will be free of psychotic symptoms 09/21/2017 0231 - Not Progressing by Reyes Ivan, RN Knowledge of the prescribed therapeutic regimen will improve 09/21/2017 0231 - Not Progressing by Reyes Ivan, RN Coping: Ability to cope will improve 09/21/2017 0231 - Not Progressing by Reyes Ivan,  RN Ability to verbalize feelings will improve 09/21/2017 0231 - Not Progressing by Reyes Ivan, RN Nutritional: Ability to achieve adequate nutritional intake will improve 09/21/2017 0231 - Not Progressing by Reyes Ivan, RN Self-Concept: Ability to verbalize positive feelings about self will improve 09/21/2017 0231 - Not Progressing by Reyes Ivan, RN

## 2017-09-21 NOTE — Progress Notes (Signed)
Post HD assessment. Pt tolerated tx well, without c/o or complications. Net UF 1021.

## 2017-09-21 NOTE — Progress Notes (Signed)
  D: Pt. Compliant with medications this evening, but extremely apprehensive about taking medications ordered for her and very questioning of why she needs to take them and asks various other paranoid questions of staffs intentions towards giving her medications and or speaking with her. Pt. Able to remain safe while on the unit. Pt. Has no si/hi ideations verbalized and denies. Pt. Denies AVH.   A: Pt. Presents overall this evening very: suspicious of staff/peers/surroundings, worried, guarded, constricted, and nervous. Pt throughout the shift had expressed several statements that are evidence of potentially responding to visual hallucinations and severe paranoia. Pt. 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   R: Pt. Frequently spent part of the night peering out her door/back window and pacing inside her room looking out towards staff. Pt. Needs reinforcement on education provided, due to level of paranoia. Pt. Hardly had anything to eat today and was very isolative and withdrawn during the evening. Pt. Unable to verbalize and engage with this writer effectively this evening. Pt. Did go to groups earlier in the day, but with minimal interactions.     Precautionary checks every 15 minutes for safety maintained, room free of safety hazards, patient sustains no injury or falls during this shift.

## 2017-09-21 NOTE — Progress Notes (Signed)
Post HD assessment  

## 2017-09-22 ENCOUNTER — Inpatient Hospital Stay: Payer: Medicare HMO

## 2017-09-22 LAB — CBC
HCT: 38.7 % (ref 35.0–47.0)
HEMOGLOBIN: 12.8 g/dL (ref 12.0–16.0)
MCH: 31.4 pg (ref 26.0–34.0)
MCHC: 33.2 g/dL (ref 32.0–36.0)
MCV: 94.7 fL (ref 80.0–100.0)
Platelets: 252 10*3/uL (ref 150–440)
RBC: 4.08 MIL/uL (ref 3.80–5.20)
RDW: 15 % — ABNORMAL HIGH (ref 11.5–14.5)
WBC: 7.7 10*3/uL (ref 3.6–11.0)

## 2017-09-22 LAB — RENAL FUNCTION PANEL
ANION GAP: 14 (ref 5–15)
Albumin: 4.4 g/dL (ref 3.5–5.0)
BUN: 16 mg/dL (ref 6–20)
CALCIUM: 9.6 mg/dL (ref 8.9–10.3)
CO2: 31 mmol/L (ref 22–32)
Chloride: 95 mmol/L — ABNORMAL LOW (ref 101–111)
Creatinine, Ser: 2.97 mg/dL — ABNORMAL HIGH (ref 0.44–1.00)
GFR calc Af Amer: 17 mL/min — ABNORMAL LOW (ref >60)
GFR calc non Af Amer: 15 mL/min — ABNORMAL LOW (ref >60)
Glucose, Bld: 111 mg/dL — ABNORMAL HIGH (ref 65–99)
PHOSPHORUS: 2.6 mg/dL (ref 2.5–4.6)
POTASSIUM: 3.1 mmol/L — AB (ref 3.5–5.1)
SODIUM: 140 mmol/L (ref 135–145)

## 2017-09-22 LAB — VITAMIN B12: VITAMIN B 12: 968 pg/mL — AB (ref 180–914)

## 2017-09-22 MED ORDER — OLANZAPINE 5 MG PO TBDP
10.0000 mg | ORAL_TABLET | Freq: Two times a day (BID) | ORAL | Status: DC
  Administered 2017-09-22: 10 mg via ORAL

## 2017-09-22 MED ORDER — QUETIAPINE FUMARATE 200 MG PO TABS
200.0000 mg | ORAL_TABLET | Freq: Every day | ORAL | Status: DC
  Administered 2017-09-22: 200 mg via ORAL
  Filled 2017-09-22 (×2): qty 2

## 2017-09-22 MED ORDER — OLANZAPINE 5 MG PO TBDP
10.0000 mg | ORAL_TABLET | Freq: Once | ORAL | Status: DC
  Filled 2017-09-22: qty 2

## 2017-09-22 NOTE — Progress Notes (Signed)
Received Joyce [name] this am in her room asleep, she was awaken for breakfast, but refused. Later she got up to take her medications and was given juice and graham crackers. She remained close to her room or immediately outside of her room with behavior indicating worried, depressed and anxious. She has a difficult time verbalizing her needs and extensive thought blocking. She was compliant with her PM medications and received a visit from her sister and pastor.

## 2017-09-22 NOTE — Progress Notes (Addendum)
Joyce [name] had a syncope episode at the conclusion of her visit with her sister and her chaplain. She fell hitting the back of her head, the area was red, but the skin was intact. She was transported back to her room via wheelchair after VS monitoring and CBG 128. Temp- 98.1, P- 94,  B/P/RA -107/54. She had a second syncope episode in the wheelchair, placed in bed and  Rapid Response called. She was taken for a head CT without incident and returned. One to one sitter in place and Dr.Clapac aware and hospitalist reponded.

## 2017-09-22 NOTE — Progress Notes (Signed)
Upon arrival to rapid response, chaplain offered patient spiritual support. Chaplain assisted patient in orientating to situation, offered listening presence, and prayer.     09/22/17 2000  Clinical Encounter Type  Visited With Patient;Health care provider  Visit Type Spiritual support;Behavioral Health;Critical Care  Spiritual Encounters  Spiritual Needs Emotional

## 2017-09-22 NOTE — Progress Notes (Signed)
D:Pt denies SI/HI/AVH. Pt. Denies pain this evening. Pt. Compliant with medications this evening, but very apprehensive still. Pt. Is able to remain safe while on the unit. Pt. Continues to present with severe paranoia and delusions. Pt. Potentially still responding to internal stimuli it appears. Pt. Reports dialysis went well, but is still very minimally engaging, in almost a psychomotor retardation state. Pt. Not eating well. Sleeping poor due to severity of paranoia. Pt. Keeps all the lights on in her room while attempting to sleep in her room. Pt. only Able to sleep when this writer sits in a chair providing emotional support and presence while she is first attempting to fall asleep.   A: Q x 15 minute observation checks were completed for safety. Patient was provided with education.Pt. Needs reinforcement on education provided, due to level of paranoia.  Patient was given scheduled medications. Patient  was encourage to attend groups, participate in unit activities and continue with plan of care.   R:Patient is complaint with medication and unit procedures. Pt. Not participate in groups.             Precautionary checks every 15 minutes for safety maintained, room free of safety hazards, patient sustains no injury or falls during this shift.   Observation  Pt. At or around 0300 in her room removing her bedding, sheets, and pillowcases in a paranoid manor observed by this Probation officer and BHT. Pt. Was engaged by this Probation officer and able to be comforted and have her bed all put back together so she could sleep.

## 2017-09-22 NOTE — Plan of Care (Signed)
Pt. Denies pain this evening. Pt. Compliant with medications this evening, but apprehensive still. Pt. Is able to remain safe while on the unit. Pt. Denies SI/HI. Pt. Continues to present with severe paranoia and delusions. Pt. Potentially still responding to internal stimuli. Pt. Reports dialysis went well, but is still very minimally engaging, in almost a psychomotor retardation state. Pt. Not eating well. Sleeping poor due to severity of paranoia. Pt. Denies AVH.       Progressing Pain Managment: General experience of comfort will improve 09/22/2017 0228 - Progressing by Reyes Ivan, RN Health Behavior/Discharge Planning: Compliance with prescribed medication regimen will improve 09/22/2017 0228 - Progressing by Reyes Ivan, RN Safety: Ability to remain free from injury will improve 09/22/2017 0228 - Progressing by Reyes Ivan, RN   Not Progressing Education: Will be free of psychotic symptoms 09/22/2017 0228 - Not Progressing by Reyes Ivan, RN Knowledge of the prescribed therapeutic regimen will improve 09/22/2017 0228 - Not Progressing by Reyes Ivan, RN Coping: Ability to cope will improve 09/22/2017 0228 - Not Progressing by Reyes Ivan, RN Ability to verbalize feelings will improve 09/22/2017 0228 - Not Progressing by Reyes Ivan, RN Nutritional: Ability to achieve adequate nutritional intake will improve 09/22/2017 0228 - Not Progressing by Reyes Ivan, RN

## 2017-09-22 NOTE — BHH Group Notes (Signed)
09/22/2017 1PM  Type of Therapy/Topic:  Group Therapy:  Feelings about Diagnosis  Participation Level:  Did Not Attend   Description of Group:   This group will allow patients to explore their thoughts and feelings about diagnoses they have received. Patients will be guided to explore their level of understanding and acceptance of these diagnoses. Facilitator will encourage patients to process their thoughts and feelings about the reactions of others to their diagnosis and will guide patients in identifying ways to discuss their diagnosis with significant others in their lives. This group will be process-oriented, with patients participating in exploration of their own experiences, giving and receiving support, and processing challenge from other group members.   Therapeutic Goals: 1. Patient will demonstrate understanding of diagnosis as evidenced by identifying two or more symptoms of the disorder 2. Patient will be able to express two feelings regarding the diagnosis 3. Patient will demonstrate their ability to communicate their needs through discussion and/or role play  Summary of Patient Progress: Patient was encouraged and invited to attend group. Patient did not attend group. Social worker will continue to encourage group participation in the future.        Therapeutic Modalities:   Cognitive Behavioral Therapy Brief Therapy Feelings Identification    Darin Engels, [place] 09/22/2017 2:02 PM

## 2017-09-22 NOTE — BHH Group Notes (Signed)
  09/22/2017  Time: 0900  Type of Therapy and Topic:  Group Therapy:  Setting Goals Participation Level:  Did Not Attend  Description of Group: In this process group, patients discussed using strengths to work toward goals and address challenges.  Patients identified two positive things about themselves and one goal they were working on.  Patients were given the opportunity to share openly and support each other's plan for self-empowerment.  The group discussed the value of gratitude and were encouraged to have a daily reflection of positive characteristics or circumstances.  Patients were encouraged to identify a plan to utilize their strengths to work on current challenges and goals.  Therapeutic Goals 1. Patient will verbalize personal strengths/positive qualities and relate how these can assist with achieving desired personal goals 2. Patients will verbalize affirmation of peers plans for personal change and goal setting 3. Patients will explore the value of gratitude and positive focus as related to successful achievement of goals 4. Patients will verbalize a plan for regular reinforcement of personal positive qualities and circumstances.  Summary of Patient Progress: Pt was invited to attend group but chose not to attend. CSW will continue to encourage pt to attend group throughout their admission.    Therapeutic Modalities Cognitive Behavioral Therapy Motivational Interviewing  Alden Hipp, MSW, LCSW 09/22/2017 9:47 AM

## 2017-09-22 NOTE — Progress Notes (Signed)
[facility] MD Progress Note  09/22/2017 10:30 AM Joyce [name]  MRN:  [id number]  Subjective:   Ms. Dilday is very paranoid today. She is sitting in a chair in her office anxious appearing, well groomed. She tells me that she want to "stop all medical" and that she will "give them whatever they want". She proceeds to talk about her glaucoma. She also asks me to "take my white shirt off". She did take medications last night with encouragement. Slept 3 hours only.   Spoke with the sister who is very supportive and reports confusion for a month.  Spoke with Dr. Thurmond Butts, her primary psychiatrist who advised to increase Seroquel. Spoke with Dr. Weber Cooks about ECT if medication fails  Treatment plan. We continue Tegretol 200 mg BID, Abilify 5 mg and Seroquel increase to 200 mg tonight.  Social/disposition. She will return home. She will follow up with Dr. Thurmond Butts,  Principal Problem: Bipolar affective disorder, current episode manic with psychotic symptoms [facility]) Diagnosis:   Patient Active Problem List   Diagnosis Date Noted  . Bipolar affective disorder, current episode manic with psychotic symptoms ([place]) [F31.2] 09/17/2017    Priority: High  . Bipolar I disorder, most recent episode (or current) manic ([place]) [F31.10] 09/02/2017  . Acute delirium [R41.0] 09/02/2017  . Altered mental status [R41.82]   . Acute encephalopathy [G93.40] 09/01/2017  . ESRD on dialysis ([place]) [N18.6, Z99.2] 04/06/2017  . Complication of vascular access for dialysis [T82.9XXA] 04/06/2017  . Hematuria [R31.9] 12/18/2016  . Polycystic kidney [Q61.3] 10/12/2016  . Ruptured cyst of kidney [Q61.00] 10/03/2016  . HTN (hypertension) [I10] 10/01/2016  . Chronic kidney disease [N18.9] 10/01/2016  . Anemia associated with chronic renal failure [D63.1] 09/24/2016  . Acute kidney insufficiency [N28.9] 07/28/2016  . Noninfectious diarrhea [K52.9]   . Benign neoplasm of cecum [D12.0]   . Benign neoplasm of ascending colon [D12.2]   .  Diarrhea [R19.7]   . Nausea [R11.0]   . Gastric polyp [K31.7]   . Chronic constipation [K59.09] 12/27/2014  . Nausea with vomiting [R11.2] 12/27/2014  . Left sided abdominal pain [R10.9] 12/27/2014  . Personal history of colonic polyps [Z86.010] 05/10/2013   Total Time spent with patient: 45 minutes  Past Psychiatric History: bipolar disorder  Past Medical History:  Past Medical History:  Diagnosis Date  . Anal fissure   . Bipolar affective disorder ([place])   . CKD (chronic kidney disease)    Dr Holley Raring [name] 4  . Colon polyps   . Diverticulitis   . Diverticulitis   . Family history of adverse reaction to anesthesia    mom - PONV  . GERD (gastroesophageal reflux disease)   . Headache    migraines - none over 10 yrs  . Heart murmur   . History of hiatal hernia   . Hypertension   . Pancreatitis    Valproic acid  . Vertigo     Past Surgical History:  Procedure Laterality Date  . A/V FISTULAGRAM Left 04/14/2017   Procedure: A/V Fistulagram;  Surgeon: Katha Cabal, MD;  Location: [facility];  Service: Cardiovascular;  Laterality: Left;  . A/V FISTULAGRAM Left 06/09/2017   Procedure: A/V FISTULAGRAM;  Surgeon: Katha Cabal, MD;  Location: [facility];  Service: Cardiovascular;  Laterality: Left;  . ABDOMINAL HYSTERECTOMY  1990 ?  . AV FISTULA PLACEMENT  4/30  . BREAST EXCISIONAL BIOPSY Left 1994   neg surgical bx  . CHOLECYSTECTOMY  2003  . COLONOSCOPY  2014   Dr. Jamal Collin  . COLONOSCOPY WITH PROPOFOL N/A 09/24/2015   Procedure: COLONOSCOPY WITH random colon byopies.;  Surgeon: Lucilla Lame, MD;  Location: [facility];  Service: Endoscopy;  Laterality: N/A;  . ESOPHAGOGASTRODUODENOSCOPY (EGD) WITH PROPOFOL N/A 09/24/2015   Procedure: ESOPHAGOGASTRODUODENOSCOPY (EGD) ;  Surgeon: Lucilla Lame, MD;  Location: [facility];  Service: Endoscopy;  Laterality: N/A;  . EYE SURGERY    . PERIPHERAL VASCULAR CATHETERIZATION N/A 05/29/2015    Procedure: A/V Shuntogram/Fistulagram;  Surgeon: Katha Cabal, MD;  Location: [facility];  Service: Cardiovascular;  Laterality: N/A;  . PERIPHERAL VASCULAR CATHETERIZATION N/A 05/29/2015   Procedure: A/V Shunt Intervention;  Surgeon: Katha Cabal, MD;  Location: [facility] [facility];  Service: Cardiovascular;  Laterality: N/A;  . POLYPECTOMY  09/24/2015   Procedure: POLYPECTOMY INTESTINAL;  Surgeon: Lucilla Lame, MD;  Location: [place];  Service: Endoscopy;;  cecal polyp ascending polyp   Family History:  Family History  Problem Relation Age of Onset  . Stroke Father   . Hypertension Father   . Breast cancer Other   . Colon cancer Neg Hx   . Liver disease Neg Hx    Family Psychiatric  History: none reported Social History:  Social History   Substance and Sexual Activity  Alcohol Use No  . Alcohol/week: 0.0 oz     Social History   Substance and Sexual Activity  Drug Use No    Social History   Socioeconomic History  . Marital status: Single    Spouse name: None  . Number of children: 1  . Years of education: None  . Highest education level: None  Social Needs  . Financial resource strain: None  . Food insecurity - worry: None  . Food insecurity - inability: None  . Transportation needs - medical: None  . Transportation needs - non-medical: None  Occupational History  . Occupation: Surveyor, quantity: [employer]  Tobacco Use  . Smoking status: Never Smoker  . Smokeless tobacco: Never Used  Substance and Sexual Activity  . Alcohol use: No    Alcohol/week: 0.0 oz  . Drug use: No  . Sexual activity: Not Currently  Other Topics Concern  . 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   Additional Social History:    History of alcohol / drug use?: No history of alcohol / drug abuse                    Sleep: Poor  Appetite:  Poor  Current  Medications: Current Facility-Administered Medications  Medication Dose Route Frequency Provider Last Rate Last Dose  . acetaminophen (TYLENOL) tablet 325 mg  325 mg Oral BID PRN Clapacs, [name], MD      . acetaminophen (TYLENOL) tablet 650 mg  650 mg Oral Q6H PRN Clapacs, [name], MD      . alum & mag hydroxide-simeth (MAALOX/MYLANTA) 200-200-20 MG/5ML suspension 30 mL  30 mL Oral Q4H PRN Clapacs, [name], MD      . ARIPiprazole (ABILIFY) tablet 5 mg  5 mg Oral Daily Clapacs, [name], MD   5 mg at 09/22/17 0946  . bisacodyl (DULCOLAX) EC tablet 5 mg  5 mg Oral Daily PRN Clapacs, [name], MD      . brimonidine (ALPHAGAN) 0.2 % ophthalmic solution 1 drop  1 drop Both Eyes BID Clapacs,  [name], MD   1 drop at 09/22/17 [phone]  . carbamazepine (TEGRETOL) tablet 200 mg  200 mg Oral BID Clapacs, [name], MD   200 mg at 09/22/17 0946  . cephALEXin (KEFLEX) capsule 500 mg  500 mg Oral Q12H Clapacs, [name], MD   500 mg at 09/22/17 0946  . cholecalciferol (VITAMIN D) tablet 1,000 Units  1,000 Units Oral Daily Clapacs, [name], MD   1,000 Units at 09/22/17 [phone]  . [START ON 10/17/2017] cyanocobalamin ((VITAMIN B-12)) injection 1,000 mcg  1,000 mcg Intramuscular Q30 days Clapacs, [name], MD      . dicyclomine (BENTYL) tablet 20 mg  20 mg Oral TID AC Clapacs, [name], MD   20 mg at 09/22/17 0946  . feeding supplement (ENSURE ENLIVE) (ENSURE ENLIVE) liquid 237 mL  237 mL Oral TID BM Saori Umholtz B, MD   237 mL at 09/21/17 2115  . lidocaine-prilocaine (EMLA) cream 1 application  1 application Topical Q M,W,F Clapacs, [name], MD      . magnesium hydroxide (MILK OF MAGNESIA) suspension 30 mL  30 mL Oral Daily PRN Clapacs, [name], MD      . midodrine (PROAMATINE) tablet 10 mg  10 mg Oral Daily Clapacs, [name], MD   10 mg at 09/22/17 0945  . pantoprazole (PROTONIX) EC tablet 40 mg  40 mg Oral Daily Clapacs, [name], MD   40 mg at 09/22/17 0946  . pramipexole (MIRAPEX) tablet 0.125 mg  0.125 mg Oral Q1400 Clapacs, [name],  MD   0.125 mg at 09/19/17 1404  . QUEtiapine (SEROQUEL) tablet 100 mg  100 mg Oral QHS Dejha King B, MD   100 mg at 09/21/17 2107  . timolol (TIMOPTIC) 0.5 % ophthalmic solution 1 drop  1 drop Both Eyes BID Clapacs, [name], MD   1 drop at 09/22/17 8250    Lab Results:  Results for orders placed or performed during the hospital encounter of 09/17/17 (from the past 48 hour(s))  Vitamin B12     Status: Abnormal   Collection Time: 09/21/17  5:08 PM  Result Value Ref Range   Vitamin B-12 968 (H) 180 - 914 pg/mL    Comment: (NOTE) This assay is not validated for testing neonatal or myeloproliferative syndrome specimens for Vitamin B12 levels. Performed at [facility], [place] [address], [place], [place] [address]   Renal function panel     Status: Abnormal   Collection Time: 09/22/17 12:14 AM  Result Value Ref Range   Sodium 140 135 - 145 mmol/L   Potassium 3.1 (L) 3.5 - 5.1 mmol/L   Chloride 95 (L) 101 - 111 mmol/L   CO2 31 22 - 32 mmol/L   Glucose, Bld 111 (H) 65 - 99 mg/dL   BUN 16 6 - 20 mg/dL   Creatinine, Ser 2.97 (H) 0.44 - 1.00 mg/dL   Calcium 9.6 8.9 - 10.3 mg/dL   Phosphorus 2.6 2.5 - 4.6 mg/dL   Albumin 4.4 3.5 - 5.0 g/dL   GFR calc non Af Amer 15 (L) >60 mL/min   GFR calc Af Amer 17 (L) >60 mL/min    Comment: (NOTE) The eGFR has been calculated using the CKD EPI equation. This calculation has not been validated in all clinical situations. eGFR's persistently <60 mL/min signify possible Chronic Kidney Disease.    Anion gap 14 5 - 15    Comment: Performed at [facility], [place], [place]  [address]  CBC     Status: Abnormal   Collection Time: 09/22/17 12:14 AM  Result Value Ref Range   WBC 7.7 3.6 - 11.0 K/uL   RBC 4.08 3.80 - 5.20 MIL/uL   Hemoglobin 12.8 12.0 - 16.0 g/dL   HCT 38.7 35.0 - 47.0 %   MCV 94.7 80.0 - 100.0 fL   MCH 31.4 26.0 - 34.0 pg   MCHC 33.2 32.0 - 36.0 g/dL   RDW 15.0 (H) 11.5 - 14.5 %   Platelets 252  150 - 440 K/uL    Comment: Performed at [facility], [address] [place], [place] [address]    Blood Alcohol level:  Lab Results  Component Value Date   [facility] <10 35/00/9381    Metabolic Disorder Labs: Lab Results  Component Value Date   HGBA1C 4.8 09/18/2017   MPG 91.06 09/18/2017   No results found for: PROLACTIN Lab Results  Component Value Date   CHOL 251 (H) 09/18/2017   TRIG 124 09/18/2017   HDL 66 09/18/2017   CHOLHDL 3.8 09/18/2017   VLDL 25 09/18/2017   LDLCALC 160 (H) 09/18/2017   LDLCALC 43 05/14/2014    Physical Findings: AIMS: Facial and Oral Movements Muscles of Facial Expression: None, normal Lips and Perioral Area: None, normal Jaw: None, normal Tongue: None, normal,Extremity Movements Upper (arms, wrists, hands, fingers): None, normal Lower (legs, knees, ankles, toes): None, normal, Trunk Movements Neck, shoulders, hips: None, normal, Overall Severity Severity of abnormal movements (highest score from questions above): None, normal Incapacitation due to abnormal movements: None, normal Patient's awareness of abnormal movements (rate only patient's report): No Awareness, Dental Status Current problems with teeth and/or dentures?: No Does patient usually wear dentures?: No  CIWA:    COWS:     Musculoskeletal: Strength & Muscle Tone: within normal limits Gait & Station: normal Patient leans: N/A  Psychiatric Specialty Exam: Physical Exam  Nursing note and vitals reviewed. Psychiatric: Her speech is normal. Her affect is angry, labile and inappropriate. She is actively hallucinating. Thought content is paranoid and delusional. Cognition and memory are impaired. She expresses impulsivity.    Review of Systems  Neurological: Negative.   Psychiatric/Behavioral: Positive for hallucinations. The patient has insomnia.   All other systems reviewed and are negative.   Blood pressure (!) 172/70, pulse (!) 106, temperature 98.3 F (36.8 C),  temperature source Axillary, resp. rate 17, height _0  (1.6 m), weight 72.6 kg (160 lb), SpO2 95 %.Body mass index is 28.34 kg/m.  General Appearance: Casual  Eye Contact:  Good  Speech:  Clear and Coherent  Volume:  Normal  Mood:  Anxious and Irritable  Affect:  Congruent  Thought Process:  Disorganized and Descriptions of Associations: Loose  Orientation:  Full (Time, Place, and Person)  Thought Content:  Delusions, Hallucinations: Auditory and Paranoid Ideation  Suicidal Thoughts:  No  Homicidal Thoughts:  No  Memory:  Immediate;   Poor Recent;   Poor Remote;   Poor  Judgement:  Poor  Insight:  Lacking  Psychomotor Activity:  Decreased  Concentration:  Concentration: Fair and Attention Span: Fair  Recall:  [employer] of Knowledge:  Fair  Language:  Good  Akathisia:  No  Handed:  Right  AIMS (if indicated):     Assets:  Communication Skills Desire for Improvement Financial Resources/Insurance Resilience Social Support  ADL's:  Intact  Cognition:  WNL  Sleep:  Number of Hours: 3.15     Treatment Plan Summary: Daily  contact with patient to assess and evaluate symptoms and progress in treatment and Medication management   Ms. Bunnell is a 72 year old female with a history of bipolar disorder and ESRD admitted for psychotic break.  #Mood/psychosis -continue Abilify 5 mg daily -continue Tegretol 200 mg BID, Tegretol level 11  #UTI -continue Keflex 500 mg BID -UTI may have been contributing to mood symptoms at admission. Repeat UA is no longer suggestive of UTI  #Dialyses -nephrology input is greatly appreciated - the patient will continue with dialysis  #GERD -Protonix 40 mg daily  #Restless legs -Mirapex 0.125 mg nightly  #Glaucoma -continue eye drops  #Vitamin deficiency -continue B12 and D -vit B12 level  #Admission status -voluntary admission  #Disposition -discharge to home -follow up with Dr. Bryna Colander,  MD 09/22/2017, 10:30 AM

## 2017-09-22 NOTE — Significant Event (Signed)
Rapid Response Event Note  Overview: Time Called: 1923 Arrival Time: 1925 Event Type: Other (Comment)  Initial Focused Assessment: Pt lying in bed no acute distress. VS WNL. RN states pt had passed out and fallen for the second time, this time striking her head.    Interventions: 12 ekg and CT scan  Plan of Care (if not transferred):Pt to have CT and be evaluated by hospitalist  Event Summary: Name of Physician Notified: Pyreddy at [facility]    at    Outcome: Stayed in room and stabalized     Minahil Quinlivan A

## 2017-09-23 LAB — TROPONIN I
TROPONIN I: 0.05 ng/mL — AB (ref <0.03)
Troponin I: 0.04 ng/mL (ref <0.03)
Troponin I: 0.05 ng/mL (ref <0.03)

## 2017-09-23 LAB — AMMONIA: AMMONIA: 13 umol/L (ref 9–35)

## 2017-09-23 LAB — GLUCOSE, CAPILLARY: Glucose-Capillary: 128 mg/dL — ABNORMAL HIGH (ref 65–99)

## 2017-09-23 LAB — PHOSPHORUS: PHOSPHORUS: 3.4 mg/dL (ref 2.5–4.6)

## 2017-09-23 MED ORDER — MIDODRINE HCL 5 MG PO TABS
10.0000 mg | ORAL_TABLET | Freq: Once | ORAL | Status: AC
  Administered 2017-09-23: 10 mg via ORAL

## 2017-09-23 MED ORDER — HALOPERIDOL 1 MG PO TABS
1.0000 mg | ORAL_TABLET | Freq: Three times a day (TID) | ORAL | Status: DC
  Administered 2017-09-23 – 2017-09-27 (×13): 1 mg via ORAL
  Filled 2017-09-23 (×14): qty 1

## 2017-09-23 MED ORDER — MIDODRINE HCL 5 MG PO TABS
10.0000 mg | ORAL_TABLET | Freq: Once | ORAL | Status: DC
  Filled 2017-09-23: qty 2

## 2017-09-23 NOTE — Progress Notes (Signed)
Patient on 1:1 Safety Sitter

## 2017-09-23 NOTE — Progress Notes (Signed)
Back to the unit from CF Scan Suite, VSS.

## 2017-09-23 NOTE — Progress Notes (Signed)
Patient asleep, remains on 1:1 with a Air cabin crew

## 2017-09-23 NOTE — Plan of Care (Signed)
Patient slept for Estimated Hours of 5.45; Precautionary observation of 1:1A for safety maintained, room free of safety hazards, patient sustains no injury or falls during this shift.

## 2017-09-23 NOTE — Progress Notes (Signed)
HD tx end  

## 2017-09-23 NOTE — BHH Group Notes (Signed)
LCSW Group Therapy Note  09/23/2017 1:00 pm  Type of Therapy/Topic:  Group Therapy:  Emotion Regulation  Participation Level:  Did Not Attend   Description of Group:    The purpose of this group is to assist patients in learning to regulate negative emotions and experience positive emotions. Patients will be guided to discuss ways in which they have been vulnerable to their negative emotions. These vulnerabilities will be juxtaposed with experiences of positive emotions or situations, and patients will be challenged to use positive emotions to combat negative ones. Special emphasis will be placed on coping with negative emotions in conflict situations, and patients will process healthy conflict resolution skills.  Therapeutic Goals: 1. Patient will identify two positive emotions or experiences to reflect on in order to balance out negative emotions 2. Patient will label two or more emotions that they find the most difficult to experience 3. Patient will demonstrate positive conflict resolution skills through discussion and/or role plays  Summary of Patient Progress:       Therapeutic Modalities:   Cognitive Behavioral Therapy Feelings Identification Dialectical Behavioral Therapy

## 2017-09-23 NOTE — Progress Notes (Signed)
Hourly rounding: 0830  In bathroom sitter at bedside 0930  Resting in bed.  1:1 sitter continued 0962 -1530  Patient off unit in dialysis. Sister continued 85 Sitting in bed.  1:1 continued 1730 Sitting on side of bed.  1:1 continued 1830 Sitting on side of bed looking out of window.  1:1 continued.

## 2017-09-23 NOTE — Progress Notes (Addendum)
[facility] MD Progress Note  09/23/2017 4:27 PM Joyce [name]  MRN:  [id number]  Subjective:   Joyce [name] had a syncopal episode yestarday and hit her head on the floor. She has 1:1 sitter. Head CT scan was negative. This morning she complains of chest pain and had elevated troponins (in a dialysis patient). In the morning, she did not appear as psychotic as yesterday but following dialysis she became extremely paranoid and conspiratorial. She ate good breakfast but did not touch her dinner. She has no somatic complaints but seems confused.   Met with her sister who witnessed the fall last night and is very concerned.   Treatment plan. We continue Tegretol and discontinue Seroquel and Abilify due to medicine consultants concerns. Started Haldol 1 mg TID. Medicine input is appreciated.Spoke with Joyce [name] who asked to discontinue ECHO as the patient had it done as recently as in February 2019. ialysis today.  Social/disposition. Her sister worries that the patient no longer is able to live independently.   Principal Problem: Bipolar affective disorder, current episode manic with psychotic symptoms ([place]) Diagnosis:   Patient Active Problem List   Diagnosis Date Noted  . Bipolar affective disorder, current episode manic with psychotic symptoms ([place]) [F31.2] 09/17/2017    Priority: High  . Bipolar I disorder, most recent episode (or current) manic ([place]) [F31.10] 09/02/2017  . Acute delirium [R41.0] 09/02/2017  . Altered mental status [R41.82]   . Acute encephalopathy [G93.40] 09/01/2017  . ESRD on dialysis ([place]) [N18.6, Z99.2] 04/06/2017  . Complication of vascular access for dialysis [T82.9XXA] 04/06/2017  . Hematuria [R31.9] 12/18/2016  . Polycystic kidney [Q61.3] 10/12/2016  . Ruptured cyst of kidney [Q61.00] 10/03/2016  . HTN (hypertension) [I10] 10/01/2016  . Chronic kidney disease [N18.9] 10/01/2016  . Anemia associated with chronic renal failure [D63.1] 09/24/2016  . Acute kidney  insufficiency [N28.9] 07/28/2016  . Noninfectious diarrhea [K52.9]   . Benign neoplasm of cecum [D12.0]   . Benign neoplasm of ascending colon [D12.2]   . Diarrhea [R19.7]   . Nausea [R11.0]   . Gastric polyp [K31.7]   . Chronic constipation [K59.09] 12/27/2014  . Nausea with vomiting [R11.2] 12/27/2014  . Left sided abdominal pain [R10.9] 12/27/2014  . Personal history of colonic polyps [Z86.010] 05/10/2013   Total Time spent with patient: 30 minutes  Past Psychiatric History: bipolar disorder  Past Medical History:  Past Medical History:  Diagnosis Date  . Anal fissure   . Bipolar affective disorder ([place])   . CKD (chronic kidney disease)    Dr Joyce [name] 4  . Colon polyps   . Diverticulitis   . Diverticulitis   . Family history of adverse reaction to anesthesia    mom - PONV  . GERD (gastroesophageal reflux disease)   . Headache    migraines - none over 10 yrs  . Heart murmur   . History of hiatal hernia   . Hypertension   . Pancreatitis    Valproic acid  . Vertigo     Past Surgical History:  Procedure Laterality Date  . A/V FISTULAGRAM Left 04/14/2017   Procedure: A/V Fistulagram;  Surgeon: Joyce Cabal, MD;  Location: [place] [facility];  Service: Cardiovascular;  Laterality: Left;  . A/V FISTULAGRAM Left 06/09/2017   Procedure: A/V FISTULAGRAM;  Surgeon: Joyce Cabal, MD;  Location: [place] [facility];  Service: Cardiovascular;  Laterality: Left;  . ABDOMINAL HYSTERECTOMY  1990 ?  . AV FISTULA PLACEMENT  4/30  .  BREAST EXCISIONAL BIOPSY Left 1994   neg surgical bx  . CHOLECYSTECTOMY  2003  . COLONOSCOPY  2014   Dr. Jamal [name]  . COLONOSCOPY WITH PROPOFOL N/A 09/24/2015   Procedure: COLONOSCOPY WITH random colon byopies.;  Surgeon: Joyce Lame, MD;  Location: [place];  Service: Endoscopy;  Laterality: N/A;  . ESOPHAGOGASTRODUODENOSCOPY (EGD) WITH PROPOFOL N/A 09/24/2015   Procedure: ESOPHAGOGASTRODUODENOSCOPY (EGD) ;  Surgeon: Joyce Lame, MD;  Location: [place];  Service: Endoscopy;  Laterality: N/A;  . EYE SURGERY    . PERIPHERAL VASCULAR CATHETERIZATION N/A 05/29/2015   Procedure: A/V Shuntogram/Fistulagram;  Surgeon: Joyce Cabal, MD;  Location: [place] [facility];  Service: Cardiovascular;  Laterality: N/A;  . PERIPHERAL VASCULAR CATHETERIZATION N/A 05/29/2015   Procedure: A/V Shunt Intervention;  Surgeon: Joyce Cabal, MD;  Location: [place] [facility];  Service: Cardiovascular;  Laterality: N/A;  . POLYPECTOMY  09/24/2015   Procedure: POLYPECTOMY INTESTINAL;  Surgeon: Joyce Lame, MD;  Location: [place];  Service: Endoscopy;;  cecal polyp ascending polyp   Family History:  Family History  Problem Relation Age of Onset  . Stroke Father   . Hypertension Father   . Breast cancer Other   . Colon cancer Neg Hx   . Liver disease Neg Hx    Family Psychiatric  History: bipolar Social History:  Social History   Substance and Sexual Activity  Alcohol Use No  . Alcohol/week: 0.0 oz     Social History   Substance and Sexual Activity  Drug Use No    Social History   Socioeconomic History  . Marital status: Single    Spouse name: None  . Number of children: 1  . Years of education: None  . Highest education level: None  Social Needs  . Financial resource strain: None  . Food insecurity - worry: None  . Food insecurity - inability: None  . Transportation needs - medical: None  . Transportation needs - non-medical: None  Occupational History  . Occupation: Surveyor, quantity: [employer]  Tobacco Use  . Smoking status: Never Smoker  . Smokeless tobacco: Never Used  Substance and Sexual Activity  . Alcohol use: No    Alcohol/week: 0.0 oz  . Drug use: No  . Sexual activity: Not Currently  Other Topics Concern  . 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   Additional Social History:     History of alcohol / drug use?: No history of alcohol / drug abuse                    Sleep: Poor  Appetite:  Poor  Current Medications: Current Facility-Administered Medications  Medication Dose Route Frequency Provider Last Rate Last Dose  . acetaminophen (TYLENOL) tablet 325 mg  325 mg Oral BID PRN Clapacs, [name], MD      . alum & mag hydroxide-simeth (MAALOX/MYLANTA) 200-200-20 MG/5ML suspension 30 mL  30 mL Oral Q4H PRN Clapacs, [name], MD      . bisacodyl (DULCOLAX) EC tablet 5 mg  5 mg Oral Daily PRN Clapacs, [name], MD      . brimonidine (ALPHAGAN) 0.2 % ophthalmic solution 1 drop  1 drop Both Eyes BID Clapacs, [name], MD   1 drop at 09/22/17 1705  . carbamazepine (TEGRETOL) tablet 200 mg  200 mg Oral BID Clapacs, [name], MD  200 mg at 09/22/17 1705  . cephALEXin (KEFLEX) capsule 500 mg  500 mg Oral Q12H Clapacs, [name], MD   500 mg at 09/22/17 2142  . cholecalciferol (VITAMIN D) tablet 1,000 Units  1,000 Units Oral Daily Clapacs, [name], MD   1,000 Units at 09/22/17 [phone]  . [START ON 10/17/2017] cyanocobalamin ((VITAMIN B-12)) injection 1,000 mcg  1,000 mcg Intramuscular Q30 days Clapacs, [name], MD      . dicyclomine (BENTYL) tablet 20 mg  20 mg Oral TID AC Clapacs, [name], MD   20 mg at 09/22/17 1703  . feeding supplement (ENSURE ENLIVE) (ENSURE ENLIVE) liquid 237 mL  237 mL Oral TID BM Dymin Dingledine B, MD   237 mL at 09/22/17 2141  . haloperidol (HALDOL) tablet 1 mg  1 mg Oral TID Keane Martelli B, MD      . lidocaine-prilocaine (EMLA) cream 1 application  1 application Topical Q M,W,F Clapacs, [name], MD      . magnesium hydroxide (MILK OF MAGNESIA) suspension 30 mL  30 mL Oral Daily PRN Clapacs, [name], MD      . pantoprazole (PROTONIX) EC tablet 40 mg  40 mg Oral Daily Clapacs, [name], MD   40 mg at 09/22/17 0946  . timolol (TIMOPTIC) 0.5 % ophthalmic solution 1 drop  1 drop Both Eyes BID Clapacs, [name], MD   1 drop at 09/22/17 1705    Lab Results:   Results for orders placed or performed during the hospital encounter of 09/17/17 (from the past 48 hour(s))  Vitamin B12     Status: Abnormal   Collection Time: 09/21/17  5:08 PM  Result Value Ref Range   Vitamin B-12 968 (H) 180 - 914 pg/mL    Comment: (NOTE) This assay is not validated for testing neonatal or myeloproliferative syndrome specimens for Vitamin B12 levels. Performed at [facility], [place] [address], [place], [place] [address]   Renal function panel     Status: Abnormal   Collection Time: 09/22/17 12:14 AM  Result Value Ref Range   Sodium 140 135 - 145 mmol/L   Potassium 3.1 (L) 3.5 - 5.1 mmol/L   Chloride 95 (L) 101 - 111 mmol/L   CO2 31 22 - 32 mmol/L   Glucose, Bld 111 (H) 65 - 99 mg/dL   BUN 16 6 - 20 mg/dL   Creatinine, Ser 2.97 (H) 0.44 - 1.00 mg/dL   Calcium 9.6 8.9 - 10.3 mg/dL   Phosphorus 2.6 2.5 - 4.6 mg/dL   Albumin 4.4 3.5 - 5.0 g/dL   GFR calc non Af Amer 15 (L) >60 mL/min   GFR calc Af Amer 17 (L) >60 mL/min    Comment: (NOTE) The eGFR has been calculated using the CKD EPI equation. This calculation has not been validated in all clinical situations. eGFR's persistently <60 mL/min signify possible Chronic Kidney Disease.    Anion gap 14 5 - 15    Comment: Performed at [facility], [address] [place], [place] [address]  CBC     Status: Abnormal   Collection Time: 09/22/17 12:14 AM  Result Value Ref Range   WBC 7.7 3.6 - 11.0 K/uL   RBC 4.08 3.80 - 5.20 MIL/uL   Hemoglobin 12.8 12.0 - 16.0 g/dL   HCT 38.7 35.0 - 47.0 %   MCV 94.7 80.0 - 100.0 fL   MCH 31.4 26.0 - 34.0 pg   MCHC 33.2 32.0 - 36.0 g/dL  RDW 15.0 (H) 11.5 - 14.5 %   Platelets 252 150 - 440 K/uL    Comment: Performed at [facility], [address] [place], [place] [address]  Glucose, capillary     Status: Abnormal   Collection Time: 09/22/17  7:14 PM  Result Value Ref Range   Glucose-Capillary 128 (H) 65 - 99 mg/dL  Troponin I (q 6hr x 3)      Status: Abnormal   Collection Time: 09/23/17 12:45 AM  Result Value Ref Range   Troponin I 0.04 (HH) <0.03 ng/mL    Comment: CRITICAL RESULT CALLED TO, READ BACK BY AND VERIFIED WITH ABI OLADOSU RN AT 0230 09/23/17.MSS Performed at [facility], [place], [place] [address]   Troponin I (q 6hr x 3)     Status: Abnormal   Collection Time: 09/23/17  7:02 AM  Result Value Ref Range   Troponin I 0.05 (HH) <0.03 ng/mL    Comment: CRITICAL VALUE NOTED. VALUE IS CONSISTENT WITH PREVIOUSLY REPORTED/CALLED VALUE  SDR Performed at [facility], [address] [place], [place] [address]   Ammonia     Status: None   Collection Time: 09/23/17  7:02 AM  Result Value Ref Range   Ammonia 13 9 - 35 umol/L    Comment: Performed at [facility], [place], [place] [address]  Troponin I (q 6hr x 3)     Status: Abnormal   Collection Time: 09/23/17 11:08 AM  Result Value Ref Range   Troponin I 0.05 (HH) <0.03 ng/mL    Comment: CRITICAL VALUE NOTED. VALUE IS CONSISTENT WITH PREVIOUSLY REPORTED/CALLED VALUE  SDR Performed at [facility], [address] [place], [place] [address]   Phosphorus     Status: None   Collection Time: 09/23/17 11:08 AM  Result Value Ref Range   Phosphorus 3.4 2.5 - 4.6 mg/dL    Comment: Performed at [facility], [address] [place], [place] [address]    Blood Alcohol level:  Lab Results  Component Value Date   [facility] <10 19/37/9024    Metabolic Disorder Labs: Lab Results  Component Value Date   HGBA1C 4.8 09/18/2017   MPG 91.06 09/18/2017   No results found for: PROLACTIN Lab Results  Component Value Date   CHOL 251 (H) 09/18/2017   TRIG 124 09/18/2017   HDL 66 09/18/2017   CHOLHDL 3.8 09/18/2017   VLDL 25 09/18/2017   LDLCALC 160 (H) 09/18/2017   LDLCALC 43 05/14/2014    Physical Findings: AIMS: Facial and Oral Movements Muscles of Facial Expression: None, normal Lips and  Perioral Area: None, normal Jaw: None, normal Tongue: None, normal,Extremity Movements Upper (arms, wrists, hands, fingers): None, normal Lower (legs, knees, ankles, toes): None, normal, Trunk Movements Neck, shoulders, hips: None, normal, Overall Severity Severity of abnormal movements (highest score from questions above): None, normal Incapacitation due to abnormal movements: None, normal Patient's awareness of abnormal movements (rate only patient's report): No Awareness, Dental Status Current problems with teeth and/or dentures?: No Does patient usually wear dentures?: No  CIWA:    COWS:     Musculoskeletal: Strength & Muscle Tone: within normal limits Gait & Station: normal Patient leans: N/A  Psychiatric Specialty Exam: Physical Exam  Nursing note and vitals reviewed. Psychiatric: Her mood appears anxious. Her affect is blunt. Her speech is delayed. She is slowed and withdrawn. Thought content is paranoid and delusional. Cognition and memory are impaired. She expresses impulsivity.  Review of Systems  Neurological: Negative.   Psychiatric/Behavioral: Positive for hallucinations.  All other systems reviewed and are negative.   Blood pressure (!) 147/80, pulse (!) 108, temperature 97.8 F (36.6 C), temperature source Axillary, resp. rate 18, height '5\' 3"'$  (1.6 m), weight 72.6 kg (160 lb), SpO2 98 %.Body mass index is 28.34 kg/m.  General Appearance: Casual  Eye Contact:  Good  Speech:  Clear and Coherent and Slow  Volume:  Normal  Mood:  Anxious  Affect:  Congruent  Thought Process:  Disorganized and Descriptions of Associations: Loose  Orientation:  Full (Time, Place, and Person)  Thought Content:  Delusions and Paranoid Ideation  Suicidal Thoughts:  No  Homicidal Thoughts:  No  Memory:  Immediate;   Fair Recent;   Fair Remote;   Fair  Judgement:  Poor  Insight:  Lacking  Psychomotor Activity:  Decreased  Concentration:  Concentration: Poor and Attention Span:  Poor  Recall:  Poor  Fund of Knowledge:  Fair  Language:  Fair  Akathisia:  No  Handed:  Right  AIMS (if indicated):     Assets:  Communication Skills Desire for Improvement Financial Resources/Insurance Housing Resilience Social Support  ADL's:  Intact  Cognition:  WNL  Sleep:  Number of Hours: 5.45     Treatment Plan Summary: Daily contact with patient to assess and evaluate symptoms and progress in treatment and Medication management   Ms. Nordmann is a 72 year old female with a history of bipolar disorder and ESRD admitted for psychotic break.  #Mood/psychosis -discontinue Abilify and Seroquel -continue Tegretol 200 mg BID, Tegretol level 11 -start haldol 1 mg TID  #UTI -continue Keflex 500 mg BID -UTI may have been contributing to mood symptoms at admission.Repeat UAis no longer suggestive of UTI  #Dialyses -nephrology input is greatly appreciated - the patient will continue with dialysis -Midrin before each treatment  #GERD -Protonix 40 mg daily  #Glaucoma -continue eye drops  #Vitamin deficiency -continue B12 and D -vit B12 level  #Admission status -voluntary admission  #Disposition -discharge to home -follow up with Dr. Bryna Colander, MD 09/23/2017, 4:27 PM

## 2017-09-23 NOTE — Progress Notes (Signed)
Asleep, remains on 1:1 with a [employer] cabin crew

## 2017-09-23 NOTE — Tx Team (Signed)
Interdisciplinary Treatment and Diagnostic Plan Update  09/23/2017 Time of Session: 10:30am Joyce [name] MRN: [id number]  Principal Diagnosis: Bipolar affective disorder, current episode manic with psychotic symptoms ([place])  Secondary Diagnoses: Principal Problem:   Bipolar affective disorder, current episode manic with psychotic symptoms ([place]) Active Problems:   ESRD on dialysis [facility])   Current Medications:  Current Facility-Administered Medications  Medication Dose Route Frequency Provider Last Rate Last Dose  . acetaminophen (TYLENOL) tablet 325 mg  325 mg Oral BID PRN Clapacs, [name], MD      . alum & mag hydroxide-simeth (MAALOX/MYLANTA) 200-200-20 MG/5ML suspension 30 mL  30 mL Oral Q4H PRN Clapacs, [name], MD      . bisacodyl (DULCOLAX) EC tablet 5 mg  5 mg Oral Daily PRN Clapacs, [name], MD      . brimonidine (ALPHAGAN) 0.2 % ophthalmic solution 1 drop  1 drop Both Eyes BID Clapacs, [name], MD   1 drop at 09/22/17 1705  . carbamazepine (TEGRETOL) tablet 200 mg  200 mg Oral BID Clapacs, [name], MD   200 mg at 09/22/17 1705  . cephALEXin (KEFLEX) capsule 500 mg  500 mg Oral Q12H Clapacs, [name], MD   500 mg at 09/22/17 2142  . cholecalciferol (VITAMIN D) tablet 1,000 Units  1,000 Units Oral Daily Clapacs, [name], MD   1,000 Units at 09/22/17 [phone]  . [START ON 10/17/2017] cyanocobalamin ((VITAMIN B-12)) injection 1,000 mcg  1,000 mcg Intramuscular Q30 days Clapacs, [name], MD      . dicyclomine (BENTYL) tablet 20 mg  20 mg Oral TID AC Clapacs, [name], MD   20 mg at 09/22/17 1703  . feeding supplement (ENSURE ENLIVE) (ENSURE ENLIVE) liquid 237 mL  237 mL Oral TID BM [name], [name], MD   237 mL at 09/22/17 2141  . lidocaine-prilocaine (EMLA) cream 1 application  1 application Topical Q M,W,F Clapacs, [name], MD      . magnesium hydroxide (MILK OF MAGNESIA) suspension 30 mL  30 mL Oral Daily PRN Clapacs, [name], MD      . pantoprazole (PROTONIX) EC tablet 40 mg  40 mg Oral  Daily Clapacs, [name], MD   40 mg at 09/22/17 0946  . timolol (TIMOPTIC) 0.5 % ophthalmic solution 1 drop  1 drop Both Eyes BID Clapacs, [name], MD   1 drop at 09/22/17 1705   PTA Medications: Medications Prior to Admission  Medication Sig Dispense Refill Last Dose  . acetaminophen (TYLENOL) 325 MG tablet Take 325 mg 2 (two) times daily as needed by mouth for moderate pain or headache.    unknown  . ARIPiprazole (ABILIFY) 2 MG tablet Take 2 mg by mouth daily.   unknown  . bisacodyl (DULCOLAX) 5 MG EC tablet Take 1 tablet (5 mg total) by mouth daily as needed for moderate constipation. (Patient not taking: Reported on 04/09/2017) 30 tablet 0 Not Taking at Unknown time  . brimonidine (ALPHAGAN) 0.2 % ophthalmic solution Place 2 (two) times daily into both eyes.   unknown  . carbamazepine (TEGRETOL) 200 MG tablet Take 200 mg by mouth 2 (two) times daily.   unknown  . cephALEXin (KEFLEX) 500 MG capsule Take 1 capsule (500 mg total) by mouth every 12 (twelve) hours. (Patient not taking: Reported on 09/16/2017) 5 capsule 0 Completed Course at Unknown time  . cholecalciferol (VITAMIN D) 1000 UNITS tablet Take 1,000 Units by mouth daily.   unknown  . cyanocobalamin (,VITAMIN B-12,)  1000 MCG/ML injection Inject 1,000 mcg into the muscle every 30 (thirty) days.   unknown  . dicyclomine (BENTYL) 20 MG tablet Take 1 tablet (20 mg total) by mouth 3 (three) times daily before meals. 30 tablet 0 unknown  . lidocaine-prilocaine (EMLA) cream Apply 1 application every Monday, Wednesday, and Friday topically. At dialysis  3 Taking  . midodrine (PROAMATINE) 10 MG tablet Take 10 mg by mouth daily.    unknown  . pantoprazole (PROTONIX) 40 MG tablet TAKE 1 TABLET EVERY DAY 90 tablet 3 unknown  . pramipexole (MIRAPEX) 0.125 MG tablet Take 0.125 mg by mouth daily at 2 PM.  3 unknown  . QUEtiapine (SEROQUEL) 25 MG tablet Take 50 mg by mouth at bedtime.    unknown  . timolol (TIMOPTIC) 0.5 % ophthalmic solution Place 1 drop  2 (two) times daily into both eyes.    unknown    Patient Stressors: Health problems  Patient Strengths: Ability for insight Active sense of humor Average or above average intelligence Capable of independent living Occupational psychologist fund of knowledge Supportive family/friends  Treatment Modalities: Medication Management, Group therapy, Case management,  1 to 1 session with clinician, Psychoeducation, Recreational therapy.   Physician Treatment Plan for Primary Diagnosis: Bipolar affective disorder, current episode manic with psychotic symptoms ([place]) Long Term Goal(s): Improvement in symptoms so as ready for discharge NA   Short Term Goals: Ability to identify changes in lifestyle to reduce recurrence of condition will improve Ability to verbalize feelings will improve Ability to disclose and discuss suicidal ideas Ability to demonstrate self-control will improve Ability to identify and develop effective coping behaviors will improve Ability to identify triggers associated with substance abuse/mental health issues will improve NA  Medication Management: Evaluate patient's response, side effects, and tolerance of medication regimen.  Therapeutic Interventions: 1 to 1 sessions, Unit Group sessions and Medication administration.  Evaluation of Outcomes: Progressing  Physician Treatment Plan for Secondary Diagnosis: Principal Problem:   Bipolar affective disorder, current episode manic with psychotic symptoms ([place]) Active Problems:   ESRD on dialysis ([place])  Long Term Goal(s): Improvement in symptoms so as ready for discharge NA   Short Term Goals: Ability to identify changes in lifestyle to reduce recurrence of condition will improve Ability to verbalize feelings will improve Ability to disclose and discuss suicidal ideas Ability to demonstrate self-control will improve Ability to identify and develop effective coping behaviors will  improve Ability to identify triggers associated with substance abuse/mental health issues will improve NA     Medication Management: Evaluate patient's response, side effects, and tolerance of medication regimen.  Therapeutic Interventions: 1 to 1 sessions, Unit Group sessions and Medication administration.  Evaluation of Outcomes: Progressing   RN Treatment Plan for Primary Diagnosis: Bipolar affective disorder, current episode manic with psychotic symptoms ([place]) Long Term Goal(s): Knowledge of disease and therapeutic regimen to maintain health will improve  Short Term Goals: Ability to demonstrate self-control and Compliance with prescribed medications will improve  Medication Management: RN will administer medications as ordered by provider, will assess and evaluate patient's response and provide education to patient for prescribed medication. RN will report any adverse and/or side effects to prescribing provider.  Therapeutic Interventions: 1 on 1 counseling sessions, Psychoeducation, Medication administration, Evaluate responses to treatment, Monitor vital signs and CBGs as ordered, Perform/monitor CIWA, COWS, AIMS and Fall Risk screenings as ordered, Perform wound care treatments as ordered.  Evaluation of Outcomes: Progressing   LCSW Treatment Plan for Primary Diagnosis:  Bipolar affective disorder, current episode manic with psychotic symptoms ([place]) Long Term Goal(s): Safe transition to appropriate next level of care at discharge, Engage patient in therapeutic group addressing interpersonal concerns.  Short Term Goals: Engage patient in aftercare planning with referrals and resources, Increase social support and Increase skills for wellness and recovery  Therapeutic Interventions: Assess for all discharge needs, 1 to 1 time with Social worker, Explore available resources and support systems, Assess for adequacy in community support network, Educate family and significant other(s)  on suicide prevention, Complete Psychosocial Assessment, Interpersonal group therapy.  Evaluation of Outcomes: Progressing   Progress in Treatment: Attending groups: Yes. Participating in groups: Yes. Taking medication as prescribed: Yes. Toleration medication: Yes. Family/Significant other contact made: No, will contact:  Patient refused family contact Patient understands diagnosis: Yes. Discussing patient identified problems/goals with staff: Yes. Medical problems stabilized or resolved: Yes. Denies suicidal/homicidal ideation: Yes. Issues/concerns per patient self-inventory: Yes. Other:   New problem(s) identified: No, Describe:  None  New Short Term/Long Term Goal(s): "To be able to focus and trust a person and not doubt my doctor."  Discharge Plan or Barriers: To return home and follow up with establish provider.   Reason for Continuation of Hospitalization: Medication stabilization  Estimated Length of Stay: 7 days  Attendees: Patient:  09/23/2017 11:24 AM  Physician: Dr. Carollee Herter, MD 09/23/2017 11:24 AM  Nursing: Polly Cobia, RN 09/23/2017 11:24 AM  RN Care Manager:  09/23/2017 11:24 AM  Social Worker: Darin Engels, [place] 09/23/2017 11:24 AM  Recreational Therapist:  09/23/2017 11:24 AM  Other: Alden Hipp, LCSW 09/23/2017 11:24 AM  Other:  09/23/2017 11:24 AM  Other: 09/23/2017 11:24 AM    Scribe for Treatment Team: Darin Engels, LCSW 09/23/2017 11:24 AM

## 2017-09-23 NOTE — Progress Notes (Signed)
Post HD assessment. Pt tolerated tx without c/o or complications. Net UF 11.

## 2017-09-23 NOTE — Progress Notes (Signed)
Hospitalist, Dr. Duane Boston, came to see the patient; patient was asleep at this time; Hospitalist was here when patient was out to [place].

## 2017-09-23 NOTE — Progress Notes (Signed)
This Probation officer was called to patient room.  Upon entering room, patient in bathroom sitting on toliet.  1:1 sitter informed this Probation officer that patient stood up and became dizzy and sitter assisted patient back down on the toilet.  When asked patient what happened verbalized "I felt dizzy".  Vital signs obtained while patient sitting on toilet(see flow sheet).  Assisted patient to standing position to attempt to get bp.  Pt. Unable to stand.  Patient momentarily became unresponsive.  Eased patient back down on toilet. Dr. Bary Leriche notified.

## 2017-09-23 NOTE — H&P (Signed)
[facility] Consultation  Joyce [name] IWP:[id number] DOB: 07-15-1946 DOA: 09/17/2017 PCP: Cletis Athens, MD   Requesting physician:  Date of consultation: Dr Bary Leriche Reason for consultation: Syncope and generalized weakness  CHIEF COMPLAINT:  No chief complaint on file.   HISTORY OF PRESENT ILLNESS: Joyce [name]  is a 72 y.o. female with a known history of end-stage renal disease on hemodialysis, hypertension, bipolar disorder.  Internal medicine service is consulted to evaluate the patient on psychiatry floor for syncopal episode and generalized weakness noted in the past 24-48 hours.  Patient is too drowsy, unable to provide history.  Most of the information was taken from reviewing the medical records and from discussion with medical staff on psychiatry floor.  Patient was recently admitted, just 5 days ago to psychiatry for a manic episode.  Seroquel and Zyprexa were added in the past 3-4 days, and an attempt to control her symptoms.  She has been noted to become progressively more drowsy and weaker, to the point that she even had 2 syncopal episodes earlier in the day after dialysis session.  Both syncopal episodes were witnessed by the medical staff.  Apparently the patient became very weak and fell down to the floor.  She lost her consciousness for just a 5-6 seconds and her vitals remained within normal limits during the episodes.  Per nursing staff, her p.o. intake was very poor in the past 2 days, due to poor appetite.  She is on Keflex p.o. for UTI. Per nursing staff, patient did not have any chest pain, shortness of breath, nausea, vomiting, diarrhea or bleeding. CBC and BMP done a few hours ago are essentially unremarkable, except for elevated creatinine level, at baseline, due to end-stage renal disease.  Brain CT, reviewed by myself, is noted without any acute abnormalities.  EKG, reviewed by myself, is noted without any acute abnormalities.   PAST MEDICAL  HISTORY:   Past Medical History:  Diagnosis Date  . Anal fissure   . Bipolar affective disorder ([place])   . CKD (chronic kidney disease)    Dr Holley Raring [name] 4  . Colon polyps   . Diverticulitis   . Diverticulitis   . Family history of adverse reaction to anesthesia    mom - PONV  . GERD (gastroesophageal reflux disease)   . Headache    migraines - none over 10 yrs  . Heart murmur   . History of hiatal hernia   . Hypertension   . Pancreatitis    Valproic acid  . Vertigo     PAST SURGICAL HISTORY:  Past Surgical History:  Procedure Laterality Date  . A/V FISTULAGRAM Left 04/14/2017   Procedure: A/V Fistulagram;  Surgeon: Katha Cabal, MD;  Location: [facility];  Service: Cardiovascular;  Laterality: Left;  . A/V FISTULAGRAM Left 06/09/2017   Procedure: A/V FISTULAGRAM;  Surgeon: Katha Cabal, MD;  Location: [facility];  Service: Cardiovascular;  Laterality: Left;  . ABDOMINAL HYSTERECTOMY  1990 ?  . AV FISTULA PLACEMENT  4/30  . BREAST EXCISIONAL BIOPSY Left 1994   neg surgical bx  . CHOLECYSTECTOMY  2003  . COLONOSCOPY  2014   Dr. Jamal Collin  . COLONOSCOPY WITH PROPOFOL N/A 09/24/2015   Procedure: COLONOSCOPY WITH random colon byopies.;  Surgeon: Lucilla Lame, MD;  Location: [facility];  Service: Endoscopy;  Laterality: N/A;  . ESOPHAGOGASTRODUODENOSCOPY (EGD) WITH PROPOFOL N/A 09/24/2015   Procedure: ESOPHAGOGASTRODUODENOSCOPY (EGD) ;  Surgeon: Lucilla Lame, MD;  Location: [facility];  Service: Endoscopy;  Laterality: N/A;  . EYE SURGERY    . PERIPHERAL VASCULAR CATHETERIZATION N/A 05/29/2015   Procedure: A/V Shuntogram/Fistulagram;  Surgeon: Katha Cabal, MD;  Location: [facility];  Service: Cardiovascular;  Laterality: N/A;  . PERIPHERAL VASCULAR CATHETERIZATION N/A 05/29/2015   Procedure: A/V Shunt Intervention;  Surgeon: Katha Cabal, MD;  Location: [facility];  Service: Cardiovascular;  Laterality: N/A;   . POLYPECTOMY  09/24/2015   Procedure: POLYPECTOMY INTESTINAL;  Surgeon: Lucilla Lame, MD;  Location: [facility];  Service: Endoscopy;;  cecal polyp ascending polyp    SOCIAL HISTORY:  Social History   Tobacco Use  . Smoking status: Never Smoker  . Smokeless tobacco: Never Used  Substance Use Topics  . Alcohol use: No    Alcohol/week: 0.0 oz    FAMILY HISTORY:  Family History  Problem Relation Age of Onset  . Stroke Father   . Hypertension Father   . Breast cancer Other   . Colon cancer Neg Hx   . Liver disease Neg Hx     DRUG ALLERGIES:  Allergies  Allergen Reactions  . Morphine And Related Shortness Of Breath    Pt reports chest pain and difficulty breathing.  . Indomethacin Hives  . Pollen Extract Other (See Comments)    Sinus problems and HA    REVIEW OF SYSTEMS:   Unable to obtain, as patient is too drowsy.  MEDICATIONS AT HOME:  Prior to Admission medications   Medication Sig Start Date End Date Taking? Authorizing Provider  acetaminophen (TYLENOL) 325 MG tablet Take 325 mg 2 (two) times daily as needed by mouth for moderate pain or headache.     [provider]  ARIPiprazole (ABILIFY) 2 MG tablet Take 2 mg by mouth daily.    [provider]  ARIPiprazole (ABILIFY) 5 MG tablet Take 1 tablet (5 mg total) by mouth daily. 09/21/17   [name], [name], MD  bisacodyl (DULCOLAX) 5 MG EC tablet Take 1 tablet (5 mg total) by mouth daily as needed for moderate constipation. Patient not taking: Reported on 04/09/2017 02/11/17   Vaughan Basta, MD  brimonidine (ALPHAGAN) 0.2 % ophthalmic solution Place 2 (two) times daily into both eyes.    [provider]  carbamazepine (TEGRETOL) 200 MG tablet Take 200 mg by mouth 2 (two) times daily. 02/13/17   [provider]  cephALEXin (KEFLEX) 500 MG capsule Take 1 capsule (500 mg total) by mouth every 12 (twelve) hours. Patient not taking: Reported on 09/16/2017 09/03/17   Demetrios Loll, MD  cholecalciferol (VITAMIN D) 1000 UNITS tablet Take 1,000 Units by mouth daily.    [provider]  cyanocobalamin (,VITAMIN B-12,) 1000 MCG/ML injection Inject 1,000 mcg into the muscle every 30 (thirty) days.    [provider]  dicyclomine (BENTYL) 20 MG tablet Take 1 tablet (20 mg total) by mouth 3 (three) times daily before meals. 09/03/17   Demetrios Loll, MD  lidocaine-prilocaine (EMLA) cream Apply 1 application every Monday, Wednesday, and Friday topically. At dialysis 05/06/17   [provider]  midodrine (PROAMATINE) 10 MG tablet Take 10 mg by mouth daily.  07/06/17   [provider]  pantoprazole (PROTONIX) 40 MG tablet TAKE 1 TABLET EVERY DAY 10/08/16   Lucilla Lame, MD  pramipexole (MIRAPEX) 0.125 MG tablet Take 0.125 mg by mouth daily at 2 PM. 03/17/17   [provider]  QUEtiapine (SEROQUEL) 25 MG tablet Take 50 mg by  mouth at bedtime.  02/13/17   [provider]  timolol (TIMOPTIC) 0.5 % ophthalmic solution Place 1 drop 2 (two) times daily into both eyes.     [provider]      PHYSICAL EXAMINATION:   VITAL SIGNS: Blood pressure (!) 109/55, pulse 82, temperature (!) 97.5 F (36.4 C), temperature source Oral, resp. rate 18, height 5\' 3"  (1.6 m), weight 72.6 kg (160 lb), SpO2 100 %.  GENERAL:  72 y.o.-year-old patient lying in the bed, drowsy, but in no acute distress.  EYES: No scleral icterus.  HEENT: Head atraumatic, normocephalic. Oropharynx and nasopharynx clear.  NECK:  Supple, no jugular venous distention. No thyroid enlargement, no tenderness.  LUNGS: Normal breath sounds bilaterally, no wheezing. No use of accessory muscles of respiration.  CARDIOVASCULAR: S1, S2 normal. No S3/S4.  ABDOMEN: Soft, nontender, nondistended. Bowel sounds present. No organomegaly or mass.  EXTREMITIES: No pedal edema, cyanosis, or clubbing.  NEUROLOGIC: Patient is moving all her extremities, no focal weakness.  Gait not  checked, due to patient being too weak to ambulate.  PSYCHIATRIC: The patient is drowsy.  SKIN: No obvious rash, lesion, or ulcer.   LABORATORY PANEL:   CBC Recent Labs  Lab 09/16/17 1908 09/22/17 0014  WBC 7.7 7.7  HGB 12.4 12.8  HCT 36.9 38.7  PLT 305 252  MCV 95.4 94.7  MCH 32.1 31.4  MCHC 33.7 33.2  RDW 15.6* 15.0*   ------------------------------------------------------------------------------------------------------------------  Chemistries  Recent Labs  Lab 09/16/17 1908 09/18/17 1248 09/19/17 0639 09/22/17 0014  NA 139 140 140 140  K 2.8* 2.8* 3.9 3.1*  CL 100* 101 97* 95*  CO2 25 26 31 31   GLUCOSE 115* 117* 117* 111*  BUN 26* 23* 16 16  CREATININE 4.72* 3.08* 3.49* 2.97*  CALCIUM 9.8 8.7* 9.6 9.6  AST 22  --   --   --   ALT 12*  --   --   --   ALKPHOS 58  --   --   --   BILITOT 0.8  --   --   --    ------------------------------------------------------------------------------------------------------------------ estimated creatinine clearance is 16.8 mL/min (A) (by C-G formula based on SCr of 2.97 mg/dL (H)). ------------------------------------------------------------------------------------------------------------------ No results for input(s): TSH, T4TOTAL, T3FREE, THYROIDAB in the last 72 hours.  Invalid input(s): FREET3   Coagulation profile No results for input(s): INR, PROTIME in the last 168 hours. ------------------------------------------------------------------------------------------------------------------- No results for input(s): DDIMER in the last 72 hours. -------------------------------------------------------------------------------------------------------------------  Cardiac Enzymes Recent Labs  Lab 09/23/17 0045  TROPONINI 0.04*   ------------------------------------------------------------------------------------------------------------------ Invalid input(s):  POCBNP  ---------------------------------------------------------------------------------------------------------------  Urinalysis    Component Value Date/Time   COLORURINE YELLOW (A) 09/20/2017 0000   APPEARANCEUR CLEAR (A) 09/20/2017 0000   APPEARANCEUR Clear 05/10/2014 1344   LABSPEC 1.005 09/20/2017 0000   LABSPEC 1.009 05/10/2014 1344   PHURINE 7.0 09/20/2017 0000   GLUCOSEU NEGATIVE 09/20/2017 0000   GLUCOSEU Negative 05/10/2014 1344   HGBUR NEGATIVE 09/20/2017 0000   BILIRUBINUR NEGATIVE 09/20/2017 0000   BILIRUBINUR Negative 05/10/2014 1344   KETONESUR NEGATIVE 09/20/2017 0000   PROTEINUR NEGATIVE 09/20/2017 0000   NITRITE NEGATIVE 09/20/2017 0000   LEUKOCYTESUR NEGATIVE 09/20/2017 0000   LEUKOCYTESUR Negative 05/10/2014 1344     RADIOLOGY: Ct Head Wo Contrast  Result Date: 09/22/2017 CLINICAL DATA:  72 y/o  F; syncope x2. EXAM: CT HEAD WITHOUT CONTRAST TECHNIQUE: Contiguous axial images were obtained from the base of the skull through the vertex without intravenous contrast. COMPARISON:  09/01/2017  CT head.  09/02/2016 MRI head. FINDINGS: Brain: No evidence of acute infarction, hemorrhage, hydrocephalus, extra-axial collection or mass lesion/mass effect. Vascular: No hyperdense vessel or unexpected calcification. Skull: Normal. Negative for fracture or focal lesion. Sinuses/Orbits: No acute finding. Other: None. IMPRESSION: Negative CT of the head. Electronically Signed   By: Kristine Garbe M.D.   On: 09/22/2017 20:37    EKG: Orders placed or performed during the hospital encounter of 09/17/17  . EKG 12-Lead  . EKG 12-Lead    IMPRESSION AND PLAN:  1.  Syncope.  This was likely vasovagal, related to medication side effects, in a patient with end-stage renal disease.  Will rule out cardiac causes.  We will check troponin levels and echocardiogram we will continue to monitor patient on telemetry/EKG. I would recommend holding Seroquel and Zyprexa for now and  re-adjusting the doses when patient is more alert.  2.  Acute encephalopathy, likely metabolic, related to medication side effects, UTI and end-stage renal disease.  Will check ammonia level.  I would recommend again, titrating down psych medications.  Hopefully, her appetite and p.o. intake would improve as well, if she becomes more alert. 3.  UTI, finish Keflex course. 4.  End-stage renal disease, on hemodialysis.  Continue management per nephrology.    All the records are reviewed and case discussed with RN.  CODE STATUS:    Code Status Orders  (From admission, onward)        Start     Ordered   09/17/17 2305  Full code  Continuous     09/17/17 2304    Code Status History    Date Active Date Inactive Code Status Order ID Comments User Context   09/01/2017 21:08 09/03/2017 19:00 Full Code [id number]  Dustin Flock, MD Inpatient   02/12/2017 18:16 02/15/2017 15:12 Full Code [id number]  Baxter Hire, MD Inpatient   02/09/2017 14:25 02/11/2017 19:17 Partial Code [id number]  Bettey Costa, MD Inpatient   02/09/2017 13:33 02/09/2017 14:25 Full Code [id number]  Bettey Costa, MD Inpatient   12/18/2016 18:43 12/20/2016 14:50 Full Code [id number]  Hillary Bow, MD ED   10/10/2016 19:34 10/12/2016 17:48 Full Code [id number]  Vaughan Basta, MD Inpatient   10/03/2016 11:43 10/06/2016 18:06 Full Code [id number]  Gladstone Lighter, MD Inpatient   07/28/2016 14:47 07/31/2016 16:04 DNR [id number]  Bettey Costa, MD ED    Advance Directive Documentation     Most Recent Value  Type of Advance Directive  Healthcare Power of Attorney  Pre-existing out of facility DNR order (yellow form or pink MOST form)  No data  "MOST" Form in Place?  No data       TOTAL TIME TAKING CARE OF THIS PATIENT: 30 minutes.    Amelia Jo M.D on 09/23/2017 at 2:46 AM  Between 7am to 6pm - Pager - [phone]  After 6pm go to [email or web address] - password [email or web address] [facility]  [phone]  CC: Primary care  physician; Cletis Athens, MD

## 2017-09-23 NOTE — Plan of Care (Signed)
  Progressing Elimination: Will not experience complications related to bowel motility 09/23/2017 1806 - Progressing by Rise Mu, RN 09/23/2017 1624 - Progressing by Rise Mu, RN Will not experience complications related to urinary retention 09/23/2017 1806 - Progressing by Rise Mu, RN 09/23/2017 1624 - Progressing by Rise Mu, RN Health Behavior/Discharge Planning: Compliance with prescribed medication regimen will improve 09/23/2017 1806 - Progressing by Rise Mu, RN Note Medication compliant.   Nutritional: Ability to achieve adequate nutritional intake will improve 09/23/2017 1806 - Progressing by Rise Mu, RN Note Appetite fair Safety: Ability to redirect hostility and anger into socially appropriate behaviors will improve 09/23/2017 1806 - Progressing by Rise Mu, RN Note No signs of hostility or anger noted.

## 2017-09-23 NOTE — Progress Notes (Signed)
Pre HD assessment  

## 2017-09-23 NOTE — Progress Notes (Signed)
Ms Sherri Rad, patient's sister who was here during the 1st Syncope episode, called to check up on patient.

## 2017-09-23 NOTE — Progress Notes (Signed)
Patient asleep now, remains on 1:1 with a Air cabin crew

## 2017-09-23 NOTE — Progress Notes (Signed)
HD tx start 

## 2017-09-23 NOTE — Progress Notes (Signed)
Transferred by Biomedical scientist to [place], accompanied by Officer, Ms Miguel Rota, SWOT RN and Ms Lisabeth Pick, [place]

## 2017-09-23 NOTE — Progress Notes (Signed)
Post HD assessment  

## 2017-09-23 NOTE — Progress Notes (Signed)
Patient remains on 1:1 with a Air cabin crew

## 2017-09-23 NOTE — Progress Notes (Signed)
Feedback of Critical lab value of Troponin provided to Dr. Jannifer Franklin and as "FYI" to the nursing Supervisor - Ms [employer].

## 2017-09-23 NOTE — Progress Notes (Addendum)
Patient remains on 1:1 with a Air cabin crew, Troponin specimen obtained

## 2017-09-24 ENCOUNTER — Encounter: Payer: Self-pay | Admitting: *Deleted

## 2017-09-24 ENCOUNTER — Inpatient Hospital Stay: Payer: Medicare HMO

## 2017-09-24 ENCOUNTER — Ambulatory Visit: Payer: Medicare HMO | Admitting: Gastroenterology

## 2017-09-24 MED ORDER — MIDODRINE HCL 5 MG PO TABS
10.0000 mg | ORAL_TABLET | Freq: Three times a day (TID) | ORAL | Status: DC
  Administered 2017-09-24 – 2017-10-22 (×53): 10 mg via ORAL
  Filled 2017-09-24 (×86): qty 2

## 2017-09-24 MED ORDER — LITHIUM CITRATE 300 MG/5 ML PO SYRP
300.0000 mg | Freq: Two times a day (BID) | ORAL | Status: DC
  Filled 2017-09-24 (×2): qty 5

## 2017-09-24 MED ORDER — MIDODRINE HCL 5 MG PO TABS
5.0000 mg | ORAL_TABLET | Freq: Three times a day (TID) | ORAL | Status: DC
  Administered 2017-09-24 (×2): 5 mg via ORAL
  Filled 2017-09-24 (×3): qty 1

## 2017-09-24 MED ORDER — MIRTAZAPINE 15 MG PO TABS
15.0000 mg | ORAL_TABLET | Freq: Every day | ORAL | Status: DC
  Administered 2017-09-26 – 2017-09-27 (×2): 15 mg via ORAL
  Filled 2017-09-24 (×4): qty 1

## 2017-09-24 NOTE — Progress Notes (Signed)
Patient sitting on the side of her bed, legs crossed, NT within arms lengths, patient expressed no concerns at this current time. Psychiatrists notified of elevated troponin as well as chest x ray results.  Patient was called for visitation. Nurse will continue to monitor.

## 2017-09-24 NOTE — Progress Notes (Signed)
7 am-8 am: Patient in room with NT within arms reach. Patient did not eat breakfast and barley drinking fluids. Patient took Medications but it took a while. Patient continues to be very confused, Patient is oriented only to self. Nurse will continue to monitor.

## 2017-09-24 NOTE — Progress Notes (Signed)
Pt presents very disorganized and delusional this evening, pt is paranoid about having a procedure tonight, but none is scheduled at this time. Pt has flight of ideas and loose associations this evening during our conversations.

## 2017-09-24 NOTE — Progress Notes (Signed)
Patient continues not to eat or drink, very irritable; staff continues to encourage fluids. BP is unsteady. Nurse notified Psychiatrist. NT within arms reach. Patient escorted to X ray with security and NT.

## 2017-09-24 NOTE — Plan of Care (Signed)
  Pain Managment: General experience of comfort will improve 09/24/2017 2049 - Progressing by Providence Crosby, RN Note Pt denies pain at this time    Education: Will be free of psychotic symptoms 09/24/2017 2049 - Not Progressing by Providence Crosby, RN Note Pt appeqars to be responding this evening   Safety: Ability to remain free from injury will improve 09/24/2017 2049 - Progressing by Providence Crosby, RN Note Pt safe on the unit at this time    Activity: Sleeping patterns will improve 09/24/2017 2049 - Progressing by Providence Crosby, RN Note Pt slept over 5 hrs last night

## 2017-09-24 NOTE — Progress Notes (Signed)
Nursing 1:1 note D:Pt observed lying in bed with eyes open. RR even and unlabored. No distress noted. A: 1:1 observation continues for safety  R: pt remains safe

## 2017-09-24 NOTE — Progress Notes (Signed)
Nursing 1:1 note D:Pt observed sitting on her bed. RR even and unlabored. No distress noted. A: 1:1 observation continues for safety  R: pt remains safe

## 2017-09-24 NOTE — Progress Notes (Signed)
Nursing 1:1 note D:Pt observed sleeping in bed with eyes closed. RR even and unlabored. No distress noted. A: 1:1 observation continues for safety  R: pt remains safe  

## 2017-09-24 NOTE — Evaluation (Signed)
Physical Therapy Evaluation Patient Details Name: Joyce [name] MRN: [id number] DOB: Jul 03, 1946 Today's Date: 09/24/2017   History of Present Illness  Pt is a 72 y.o. F that presented to hospital and admitted on 09/17/17 w/ psychotic, paranoia, and delusional symptoms. PMHX includes:  End stage renal disease, hemodialysis, HTN, murmur, bipolar, GERD     Clinical Impression  Pt demonstrated good balance independently with sitting and transfers and at least 3/5 strength in BUE and BLE.  Ambulation was deferred due to orthostatic hypotension (Sitting BP 95/71 HR: 158 bpm; Standing BP 62/35 HR: 146bpm) and history of recent syncope (09/22/17). Pt demonstrated difficulty participating in PT session as well as agitation, confusion, and impulsiveness; PT session was ended when Pt was no longer compliant.  However, at end of session, therapist observed Pt stand from EOB and ambulate to window and back to bed independently. Pt demonstrated steady balance and step through pattern with ambulation (sitter present assisting Pt back to bed due to BP concerns).  No acute PT needs identified at this time; Pt mobility limited due to orthostatic hypotension and willingness to ambulate. Will discontinue current PT order; please re-consult PT if patient status changes and acute PT needs are identified.    Follow Up Recommendations No PT follow up    Equipment Recommendations  None recommended by PT    Recommendations for Other Services       Precautions / Restrictions Precautions Precautions: Fall Precaution Comments: Pt has had two syncope episodes and orthostatic hypotension while hospitalized. LUE fistula port for HD. Restrictions Weight Bearing Restrictions: No      Mobility  Bed Mobility                  Transfers Overall transfer level: Independent Equipment used: None             General transfer comment: Pt demonstrated good balance with sit to stand with supervision  only.  Ambulation/Gait Ambulation/Gait assistance: Independent Ambulation Distance (Feet): 10 Feet Assistive device: None Gait Pattern/deviations: WFL(Within Functional Limits)     General Gait Details: Ambulation was deferred due to orthostatic hypotension (see details in vitals section).  However, at end of session, therapist observed from a distance that Pt stood from EOB and walked 10 ft to the window and back towards bed demonstrating step through pattern and steady balance.  Stairs            Wheelchair Mobility    Modified Rankin (Stroke Patients Only)       Balance Overall balance assessment: Independent   Sitting balance-Leahy Scale: Normal Sitting balance - Comments: Pt demonstrated good sitting balance at EOB while donning shoes.     Standing balance-Leahy Scale: Normal Standing balance comment: Pt demonstrated independent standing balance at EOB, while having BP checked.                             Pertinent Vitals/Pain Pain Assessment: Faces Faces Pain Scale: No hurt Pain Intervention(s): Limited activity within patient's tolerance;Monitored during session    [place] expects to be discharged to:: Private residence Living Arrangements: Alone               Additional Comments: Pt was disoriented and unable to provide information about home living situation.  Information provided in this section is based on information discovered in chart review.    Prior Function Level of Independence: Independent  Hand Dominance        Extremity/Trunk Assessment   Upper Extremity Assessment Upper Extremity Assessment: Overall WFL for tasks assessed    Lower Extremity Assessment Lower Extremity Assessment: Overall WFL for tasks assessed    Cervical / Trunk Assessment Cervical / Trunk Assessment: Normal  Communication   Communication: No difficulties  Cognition Arousal/Alertness: Awake/alert Behavior  During Therapy: Agitated;Anxious;Impulsive Overall Cognitive Status: Difficult to assess                                 General Comments: Pt's cognitive status was difficult to assess due to agitation, anxiousness, and impulsivity. Pt did appear to be confused.      General Comments General comments (skin integrity, edema, etc.): Pt demonstrated good balance with transfers and ambulation.  Further ambulation assessment was only deferred due to orthostatic hypotension.    Exercises Total Joint Exercises Long Arc Quad: AROM;Strengthening;Left;5 reps(EOB BLE strengthening exercises were attempted, Pt refused to participate any further after one set of L LAQ exercises.)   Assessment/Plan    PT Assessment Patent does not need any further PT services  PT Problem List         PT Treatment Interventions      PT Goals (Current goals can be found in the Care Plan section)  Acute Rehab PT Goals Patient Stated Goal: I want to go home. PT Goal Formulation: With patient Time For Goal Achievement: 10/08/17 Potential to Achieve Goals: Good    Frequency     Barriers to discharge        Co-evaluation               AM-PAC PT "6 Clicks" Daily Activity  Outcome Measure Difficulty turning over in bed (including adjusting bedclothes, sheets and blankets)?: None Difficulty moving from lying on back to sitting on the side of the bed? : None Difficulty sitting down on and standing up from a chair with arms (e.g., wheelchair, bedside commode, etc,.)?: None Help needed moving to and from a bed to chair (including a wheelchair)?: None Help needed walking in hospital room?: None Help needed climbing 3-5 steps with a railing? : None 6 Click Score: 24    End of Session   Activity Tolerance: Treatment limited secondary to medical complications (Comment);Treatment limited secondary to agitation(Pt limited by orthostatic hypotension and behavioral issues (see cognition for  details).) Patient left: in bed;with nursing/sitter in room(sitting EOB with NT present.) Nurse Communication: Mobility status PT Visit Diagnosis: History of falling (Z91.81);Repeated falls (R29.6)(History of repeated falls in hospital secondary to syncope.)    Time: 8563-1497 PT Time Calculation (min) (ACUTE ONLY): 17 min   Charges:         PT G Codes:         Kerrion Kemppainen Mondrian-Pardue , SPT 09/24/2017, 3:50 PM

## 2017-09-24 NOTE — [facility] Group Notes (Signed)
09/24/2017  Time: 1:00PM  Type of Therapy/Topic:  Group Therapy:  Balance in Life  Participation Level:  Did Not Attend  Description of Group:   This group will address the concept of balance and how it feels and looks when one is unbalanced. Patients will be encouraged to process areas in their lives that are out of balance and identify reasons for remaining unbalanced. Facilitators will guide patients in utilizing problem-solving interventions to address and correct the stressor making their life unbalanced. Understanding and applying boundaries will be explored and addressed for obtaining and maintaining a balanced life. Patients will be encouraged to explore ways to assertively make their unbalanced needs known to significant others in their lives, using other group members and facilitator for support and feedback.  Therapeutic Goals: 1. Patient will identify two or more emotions or situations they have that consume much of in their lives. 2. Patient will identify signs/triggers that life has become out of balance:  3. Patient will identify two ways to set boundaries in order to achieve balance in their lives:  4. Patient will demonstrate ability to communicate their needs through discussion and/or role plays  Summary of Patient Progress: Pt was invited to attend group but chose not to attend. CSW will continue to encourage pt to attend group throughout their admission.    Therapeutic Modalities:   Cognitive Behavioral Therapy Solution-Focused Therapy Assertiveness Training  Alden Hipp, MSW, LCSW 09/24/2017 2:09 PM

## 2017-09-24 NOTE — [facility] Group Notes (Signed)
  09/24/2017 9am  Type of Therapy and Topic: Group Therapy: Goals Group: SMART Goals   Participation Level: Did Not Attend  Description of Group:    The purpose of a daily goals group is to assist and guide patients in setting recovery/wellness-related goals. The objective is to set goals as they relate to the crisis in which they were admitted. Patients will be using SMART goal modalities to set measurable goals. Characteristics of realistic goals will be discussed and patients will be assisted in setting and processing how one will reach their goal. Facilitator will also assist patients in applying interventions and coping skills learned in psycho-education groups to the SMART goal and process how one will achieve defined goal.   Therapeutic Goals:   -Patients will develop and document one goal related to or their crisis in which brought them into treatment.  -Patients will be guided by LCSW using SMART goal setting modality in how to set a measurable, attainable, realistic and time sensitive goal.  -Patients will process barriers in reaching goal.  -Patients will process interventions in how to overcome and successful in reaching goal.   Patient's Goal: Patient was encouraged and invited to attend group. Patient did not attend group. Social worker will continue to encourage group participation in the future.    Therapeutic Modalities:  Motivational Interviewing  Cognitive Behavioral Therapy  Crisis Intervention Model  SMART goals setting  Darin Engels, [place] 09/24/2017 9:54 AM

## 2017-09-24 NOTE — Progress Notes (Signed)
Patient sitting up in bed; no concerns; patient agreed to let nurse take only one set of statistics while sitting on bed, blood pressure 135/108; pulse 111; respirations of 21. Patient only took one bite of soup and 1 bite of her sandwich for dinner, appetite remains poor. Staff continues to encourage eating and drinking. Patient received evening medications including proamatine to help with her blood pressure. NT within arms distance. Nurse will continue to monitor.

## 2017-09-24 NOTE — Progress Notes (Addendum)
Patient vitals orthostatics rechecked patient BP sitting 95/71       Pulse :158       Respiration:21        BP standing: 62/35       Pulse 146       Respirations 22 Physical therapy came to work with patient, but due to large decrease in BP when patient was not able to participate in walking exercises. Nurse will continue to monitor, NT within arms reach. 1:1 continued. Patient will be escorted via security and NT for chest x ray.

## 2017-09-24 NOTE — Progress Notes (Signed)
8 am -9 am Patient in room; vitals rechecked sitting: 158/90, respirations 22; 97 oxygen sat.; pulse: 115 Standing BP: 150/82; respirations 20, pulse 120. NT within arms reach, no concerns at this time. Nurse will continue to monitor.

## 2017-09-24 NOTE — Progress Notes (Signed)
Received Joyce [name] this am in her room with a sitter at the bedside, she is wake and answered my assessment question, how are you, with ok after about 30 seconds. She continued to lie in bed throughout the evening without distress. She was awake all night with the sitter at the bedside.  0730 awake in bed 0830 awake in bed 0930 awake in bed  1030 awake in bed 1130 awake in bed 0030 awake in bed 0130 awake in bed  0230 awake in bed 0330 awake in bed and was trying to sit up and get out of bed 0430 awake in bed 0530 awake in bed  0630 awake in bed

## 2017-09-24 NOTE — Progress Notes (Signed)
RN rechecked BP after administration of Proamatine, 135/60 BP; pulse of 89; 20 respirations. Patient is visiting with her sister and seems to be much more cooperative when visitor is present. Patient continues to be with a [employer] cabin crew. Nurse will continue to monitor.

## 2017-09-24 NOTE — Progress Notes (Signed)
Patient ID: Joyce [name], female   DOB: [birth year], 72 y.o.   MRN: [id number]  [facility] PROGRESS NOTE  Joyce [name] EXB:[id number] DOB: 1946/05/11 DOA: 09/17/2017 PCP: Cletis Athens, MD  HPI/Subjective: Patient answer some yes/no questions.  She states that she wants to go outside to help the people.  I did not see any people outside where she was pointing.  Objective: Vitals:   09/24/17 0546 09/24/17 0858  BP: (!) 157/68 (!) 158/90  Pulse: (!) 107 (!) 115  Resp: 20 (!) 22  Temp: 99.1 F (37.3 C) 98.3 F (36.8 C)  SpO2: 97% 97%    Filed Weights    ROS: Review of Systems  Unable to perform ROS: Psychiatric disorder  Respiratory: Negative for shortness of breath.   Cardiovascular: Positive for chest pain.  Gastrointestinal: Negative for abdominal pain, diarrhea, nausea and vomiting.  Genitourinary: Negative for dysuria.  Musculoskeletal: Negative for joint pain.   Exam: Physical Exam  HENT:  Nose: No mucosal edema.  Mouth/Throat: No oropharyngeal exudate or posterior oropharyngeal edema.  Eyes: Conjunctivae and lids are normal. Pupils are equal, round, and reactive to light.  Neck: No JVD present. Carotid bruit is not present. No edema present. No thyroid mass and no thyromegaly present.  Cardiovascular: S1 normal and S2 normal. Exam reveals no gallop.  No murmur heard. Pulses:      Dorsalis pedis pulses are 2+ on the right side, and 2+ on the left side.  Respiratory: No respiratory distress. She has no wheezes. She has no rhonchi. She has no rales.  GI: Soft. Bowel sounds are normal. There is no tenderness.  Musculoskeletal:       Right ankle: She exhibits no swelling.       Left ankle: She exhibits no swelling.  Lymphadenopathy:    She has no cervical adenopathy.  Neurological: She is alert. No cranial nerve deficit.  Power 5 out of 5 upper and lower extremities  Skin: Skin is warm. No rash noted. Nails show no clubbing.  Psychiatric: Her speech is  normal. Her affect is blunt. Thought content is paranoid.      Data Reviewed: Basic Metabolic Panel: Recent Labs  Lab 09/18/17 1248 09/19/17 0639 09/22/17 0014 09/23/17 1108  NA 140 140 140  --   K 2.8* 3.9 3.1*  --   CL 101 97* 95*  --   CO2 26 31 31   --   GLUCOSE 117* 117* 111*  --   BUN 23* 16 16  --   CREATININE 3.08* 3.49* 2.97*  --   CALCIUM 8.7* 9.6 9.6  --   PHOS 2.0*  --  2.6 3.4   Liver Function Tests: Recent Labs  Lab 09/18/17 1248 09/22/17 0014  ALBUMIN 3.9 4.4    Recent Labs  Lab 09/23/17 0702  AMMONIA 13   CBC: Recent Labs  Lab 09/22/17 0014  WBC 7.7  HGB 12.8  HCT 38.7  MCV 94.7  PLT 252   Cardiac Enzymes: Recent Labs  Lab 09/23/17 0045 09/23/17 0702 09/23/17 1108  TROPONINI 0.04* 0.05* 0.05*    CBG: Recent Labs  Lab 09/22/17 1914  GLUCAP 128*      Studies: Ct Head Wo Contrast  Result Date: 09/22/2017 CLINICAL DATA:  72 y/o  F; syncope x2. EXAM: CT HEAD WITHOUT CONTRAST TECHNIQUE: Contiguous axial images were obtained from the base of the skull through the vertex without intravenous contrast. COMPARISON:  09/01/2017 CT head.  09/02/2016 MRI head. FINDINGS: Brain: No evidence  of acute infarction, hemorrhage, hydrocephalus, extra-axial collection or mass lesion/mass effect. Vascular: No hyperdense vessel or unexpected calcification. Skull: Normal. Negative for fracture or focal lesion. Sinuses/Orbits: No acute finding. Other: None. IMPRESSION: Negative CT of the head. Electronically Signed   By: Kristine Garbe M.D.   On: 09/22/2017 20:37    Scheduled Meds: . brimonidine  1 drop Both Eyes BID  . carbamazepine  200 mg Oral BID  . cholecalciferol  1,000 Units Oral Daily  . [START ON 10/17/2017] cyanocobalamin  1,000 mcg Intramuscular Q30 days  . dicyclomine  20 mg Oral TID AC  . feeding supplement (ENSURE ENLIVE)  237 mL Oral TID BM  . haloperidol  1 mg Oral TID  . lidocaine-prilocaine  1 application Topical Q M,W,F  .  midodrine  5 mg Oral TID WC  . pantoprazole  40 mg Oral Daily  . timolol  1 drop Both Eyes BID   Continuous Infusions:  Assessment/Plan:  1. Orthostatic hypotension with syncope. Secondary to poor appetite and deconditioning.  We will get physical therapy evaluation.  Start midodrine 5 mg 3 times daily.  Would not give fluids in a dialysis patient.  Obtain chest x-ray.  Patient needs to eat and drink better. 2. End-stage renal disease on dialysis as per nephrology. 3. Bipolar disorder with paranoid component on carbamazepine and Haldol 4. GERD on Protonix 5. Glaucoma on eyedrops 6. No signs of urinary tract infection stop Keflex  Code Status:     Code Status Orders  (From admission, onward)        Start     Ordered   09/17/17 2305  Full code  Continuous     09/17/17 2304    Code Status History    Date Active Date Inactive Code Status Order ID Comments User Context   09/01/2017 21:08 09/03/2017 19:00 Full Code [id number]  Dustin Flock, MD Inpatient   02/12/2017 18:16 02/15/2017 15:12 Full Code [id number]  Baxter Hire, MD Inpatient   02/09/2017 14:25 02/11/2017 19:17 Partial Code [id number]  Bettey Costa, MD Inpatient   02/09/2017 13:33 02/09/2017 14:25 Full Code [id number]  Bettey Costa, MD Inpatient   12/18/2016 18:43 12/20/2016 14:50 Full Code [id number]  Hillary Bow, MD ED   10/10/2016 19:34 10/12/2016 17:48 Full Code [id number]  Vaughan Basta, MD Inpatient   10/03/2016 11:43 10/06/2016 18:06 Full Code [id number]  Gladstone Lighter, MD Inpatient   07/28/2016 14:47 07/31/2016 16:04 DNR [id number]  Bettey Costa, MD ED    Advance Directive Documentation     Most Recent Value  Type of Advance Directive  Healthcare Power of Attorney  Pre-existing out of facility DNR order (yellow form or pink MOST form)  No data  "MOST" Form in Place?  No data     Family Communication: As per psychiatry Disposition Plan: As per psychiatry  Consultants:  Psychiatry  Nephrology  Time spent:  25 minutes  [place]

## 2017-09-24 NOTE — Progress Notes (Addendum)
11am-12 am: Medical doctor visited with patient. Patient in wheelchair while NT takes patient around the unit. Patient expresses no concerns at the moment. Patient remains safe. Nurse will continue to monitor.  12 pm- 1 pm: Patient in room with NT within arms reach sitting up in her bed. Patient expressed no concerns at this time. Nurse will continue to monitor.

## 2017-09-24 NOTE — Progress Notes (Signed)
[facility] Joyce [name] Progress Note  09/24/2017 5:50 PM Joyce [name]  MRN:  [id number]  Subjective:   Joyce [name] is anxious appearing and very paranoid today. I also believe that she is hallucinating. She is pointing to invisible person, she is frighten. She is unable to have any comprehensible conversation. I saw her again in the afternoon when her sister visited. She recognized her sister but was unable to interact appropriately. The patient has not been eating or drinking.  Met with the sister again to discuss ECT.  Treatment plan. We discontinued Seroquel and Abilify. We continue Tegretol as usual and started Haldol 1 mg TID. I would like to start Lithium. Dialysis tomorrow. Spoke with Joyce. Weber Cooks abut ECT. He is in favor.  Social/disposition. She lives independently but we are not sure that this is still possible. Follow up with Joyce. Thurmond Butts.  Principal Problem: Bipolar affective disorder, current episode manic with psychotic symptoms ([place]) Diagnosis:   Patient Active Problem List   Diagnosis Date Noted  . Bipolar affective disorder, current episode manic with psychotic symptoms ([place]) [F31.2] 09/17/2017    Priority: High  . Bipolar I disorder, most recent episode (or current) manic ([place]) [F31.10] 09/02/2017  . Acute delirium [R41.0] 09/02/2017  . Altered mental status [R41.82]   . Acute encephalopathy [G93.40] 09/01/2017  . ESRD on dialysis ([place]) [N18.6, Z99.2] 04/06/2017  . Complication of vascular access for dialysis [T82.9XXA] 04/06/2017  . Hematuria [R31.9] 12/18/2016  . Polycystic kidney [Q61.3] 10/12/2016  . Ruptured cyst of kidney [Q61.00] 10/03/2016  . HTN (hypertension) [I10] 10/01/2016  . Chronic kidney disease [N18.9] 10/01/2016  . Anemia associated with chronic renal failure [D63.1] 09/24/2016  . Acute kidney insufficiency [N28.9] 07/28/2016  . Noninfectious diarrhea [K52.9]   . Benign neoplasm of cecum [D12.0]   . Benign neoplasm of ascending colon [D12.2]   . Diarrhea [R19.7]    . Nausea [R11.0]   . Gastric polyp [K31.7]   . Chronic constipation [K59.09] 12/27/2014  . Nausea with vomiting [R11.2] 12/27/2014  . Left sided abdominal pain [R10.9] 12/27/2014  . Personal history of colonic polyps [Z86.010] 05/10/2013   Total Time spent with patient: 30 minutes  Past Psychiatric History: bipolar disorder  Past Medical History:  Past Medical History:  Diagnosis Date  . Anal fissure   . Bipolar affective disorder ([place])   . CKD (chronic kidney disease)    Joyce [name] 5  . Colon polyps   . Diverticulitis   . Diverticulitis   . Family history of adverse reaction to anesthesia    mom - PONV  . GERD (gastroesophageal reflux disease)   . Headache    migraines - none over 10 yrs  . Heart murmur   . History of hiatal hernia   . Hypertension   . Pancreatitis    Valproic acid  . Vertigo     Past Surgical History:  Procedure Laterality Date  . A/V FISTULAGRAM Left 04/14/2017   Procedure: A/V Fistulagram;  Surgeon: Katha Cabal, Joyce [name];  Location: [place] [facility];  Service: Cardiovascular;  Laterality: Left;  . A/V FISTULAGRAM Left 06/09/2017   Procedure: A/V FISTULAGRAM;  Surgeon: Katha Cabal, Joyce [name];  Location: [facility];  Service: Cardiovascular;  Laterality: Left;  . ABDOMINAL HYSTERECTOMY  1990 ?  . AV FISTULA PLACEMENT  4/30  . BREAST EXCISIONAL BIOPSY Left 1994   neg surgical bx  . CHOLECYSTECTOMY  2003  . COLONOSCOPY  2014   Joyce. Jamal Collin  . COLONOSCOPY WITH  PROPOFOL N/A 09/24/2015   Procedure: COLONOSCOPY WITH random colon byopies.;  Surgeon: Lucilla Lame, Joyce [name];  Location: [facility];  Service: Endoscopy;  Laterality: N/A;  . ESOPHAGOGASTRODUODENOSCOPY (EGD) WITH PROPOFOL N/A 09/24/2015   Procedure: ESOPHAGOGASTRODUODENOSCOPY (EGD) ;  Surgeon: Lucilla Lame, Joyce [name];  Location: [place] [facility];  Service: Endoscopy;  Laterality: N/A;  . EYE SURGERY    . PERIPHERAL VASCULAR CATHETERIZATION N/A 05/29/2015   Procedure: A/V  Shuntogram/Fistulagram;  Surgeon: Katha Cabal, Joyce [name];  Location: [facility];  Service: Cardiovascular;  Laterality: N/A;  . PERIPHERAL VASCULAR CATHETERIZATION N/A 05/29/2015   Procedure: A/V Shunt Intervention;  Surgeon: Katha Cabal, Joyce [name];  Location: [place] [facility];  Service: Cardiovascular;  Laterality: N/A;  . POLYPECTOMY  09/24/2015   Procedure: POLYPECTOMY INTESTINAL;  Surgeon: Lucilla Lame, Joyce [name];  Location: [facility];  Service: Endoscopy;;  cecal polyp ascending polyp   Family History:  Family History  Problem Relation Age of Onset  . Stroke Father   . Hypertension Father   . Breast cancer Other   . Colon cancer Neg Hx   . Liver disease Neg Hx    Family Psychiatric  History: bipolar Social History:  Social History   Substance and Sexual Activity  Alcohol Use No  . Alcohol/week: 0.0 oz     Social History   Substance and Sexual Activity  Drug Use No    Social History   Socioeconomic History  . Marital status: Single    Spouse name: None  . Number of children: 1  . Years of education: None  . Highest education level: None  Social Needs  . Financial resource strain: None  . Food insecurity - worry: None  . Food insecurity - inability: None  . Transportation needs - medical: None  . Transportation needs - non-medical: None  Occupational History  . Occupation: Surveyor, quantity: [employer]  Tobacco Use  . Smoking status: Never Smoker  . Smokeless tobacco: Never Used  Substance and Sexual Activity  . Alcohol use: No    Alcohol/week: 0.0 oz  . Drug use: No  . Sexual activity: Not Currently  Other Topics Concern  . 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   Additional Social History:    History of alcohol / drug use?: No history of alcohol / drug abuse                    Sleep: Poor  Appetite:  Poor  Current Medications: Current  Facility-Administered Medications  Medication Dose Route Frequency Provider Last Rate Last Dose  . acetaminophen (TYLENOL) tablet 325 mg  325 mg Oral BID PRN Joyce [name], Joyce [name], Joyce [name]      . alum & mag hydroxide-simeth (MAALOX/MYLANTA) 200-200-20 MG/5ML suspension 30 mL  30 mL Oral Q4H PRN Joyce [name], Joyce [name], Joyce [name]      . bisacodyl (DULCOLAX) EC tablet 5 mg  5 mg Oral Daily PRN Joyce [name], Joyce [name], Joyce [name]      . brimonidine (ALPHAGAN) 0.2 % ophthalmic solution 1 drop  1 drop Both Eyes BID Joyce [name], Joyce Reno, Joyce [name]   1 drop at 09/24/17 0831  . carbamazepine (TEGRETOL) tablet 200 mg  200 mg Oral BID Joyce [name], Joyce [place], Joyce [name]   200 mg at 09/24/17 1716  . cholecalciferol (VITAMIN D) tablet 1,000 Units  1,000 Units Oral Daily Joyce [name], Joyce [place], Joyce [name]   1,000 Units  at 09/24/17 0830  . [START ON 10/17/2017] cyanocobalamin ((VITAMIN [name]-12)) injection 1,000 mcg  1,000 mcg Intramuscular Q30 days Joyce [name], Joyce [name], Joyce [name]      . dicyclomine (BENTYL) tablet 20 mg  20 mg Oral TID AC Joyce [name], Joyce Ren[place], Joyce [name]   20 mg at 09/24/17 1716  . feeding supplement (ENSURE ENLIVE) (ENSURE ENLIVE) liquid 237 mL  237 mL Oral TID BM Joyce Hayashida [name], Joyce [name]   237 mL at 09/24/17 1427  . haloperidol (HALDOL) tablet 1 mg  1 mg Oral TID Joyce Tennant [name], Joyce [name]   1 mg at 09/24/17 1716  . lidocaine-prilocaine (EMLA) cream 1 application  1 application Topical Q M,W,F Joyce [name], Joyce [name], Joyce [name]      . magnesium hydroxide (MILK OF MAGNESIA) suspension 30 mL  30 mL Oral Daily PRN Joyce [name], Joyce [name], Joyce [name]      . midodrine (PROAMATINE) tablet 10 mg  10 mg Oral TID WC Joyce Grayer, Joyce [name]   10 mg at 09/24/17 1716  . pantoprazole (PROTONIX) EC tablet 40 mg  40 mg Oral Daily Joyce [name], Joyce [name], Joyce [name]   40 mg at 09/24/17 0830  . timolol (TIMOPTIC) 0.5 % ophthalmic solution 1 drop  1 drop Both Eyes BID Joyce [name], Joyce [name], Joyce [name]   1 drop at 09/24/17 1727    Lab Results:  Results for orders placed or performed during the hospital encounter of 09/17/17 (from the past 48 hour(s))  Glucose, capillary     Status:  Abnormal   Collection Time: 09/22/17  7:14 PM  Result Value Ref Range   Glucose-Capillary 128 (H) 65 - 99 mg/dL  Troponin I (q 6hr x 3)     Status: Abnormal   Collection Time: 09/23/17 12:45 AM  Result Value Ref Range   Troponin I 0.04 (HH) <0.03 ng/mL    Comment: CRITICAL RESULT CALLED TO, READ BACK BY AND VERIFIED WITH Joyce [name] AT 0230 09/23/17.MSS Performed at [facility], [place], [place] [address]   Troponin I (q 6hr x 3)     Status: Abnormal   Collection Time: 09/23/17  7:02 AM  Result Value Ref Range   Troponin I 0.05 (HH) <0.03 ng/mL    Comment: CRITICAL VALUE NOTED. VALUE IS CONSISTENT WITH PREVIOUSLY REPORTED/CALLED VALUE  SDR Performed at [facility], [address] [place], [place] [address]   Ammonia     Status: None   Collection Time: 09/23/17  7:02 AM  Result Value Ref Range   Ammonia 13 9 - 35 umol/L    Comment: Performed at [facility], [place], [place] [address]  Troponin I (q 6hr x 3)     Status: Abnormal   Collection Time: 09/23/17 11:08 AM  Result Value Ref Range   Troponin I 0.05 (HH) <0.03 ng/mL    Comment: CRITICAL VALUE NOTED. VALUE IS CONSISTENT WITH PREVIOUSLY REPORTED/CALLED VALUE  SDR Performed at [facility], [address] [place], [place] [address]   Phosphorus     Status: None   Collection Time: 09/23/17 11:08 AM  Result Value Ref Range   Phosphorus 3.4 2.5 - 4.6 mg/dL    Comment: Performed at [facility], [address] [place],  [address]    Blood Alcohol level:  Lab Results  Component Value Date   [facility] <10 34/91/7915    Metabolic Disorder Labs: Lab Results  Component Value Date   HGBA1C 4.8 09/18/2017   MPG 91.06 09/18/2017   No  results found for: PROLACTIN Lab Results  Component Value Date   CHOL 251 (H) 09/18/2017   TRIG 124 09/18/2017   HDL 66 09/18/2017   CHOLHDL 3.8 09/18/2017   VLDL 25 09/18/2017   LDLCALC 160 (H)  09/18/2017   LDLCALC 43 05/14/2014    Physical Findings: AIMS: Facial and Oral Movements Muscles of Facial Expression: None, normal Lips and Perioral Area: None, normal Jaw: None, normal Tongue: None, normal,Extremity Movements Upper (arms, wrists, hands, fingers): None, normal Lower (legs, knees, ankles, toes): None, normal, Trunk Movements Neck, shoulders, hips: None, normal, Overall Severity Severity of abnormal movements (highest score from questions above): None, normal Incapacitation due to abnormal movements: None, normal Patient's awareness of abnormal movements (rate only patient's report): No Awareness, Dental Status Current problems with teeth and/or dentures?: No Does patient usually wear dentures?: No  CIWA:    COWS:     Musculoskeletal: Strength & Muscle Tone: decreased Gait & Station: unsteady Patient leans: N/A  Psychiatric Specialty Exam: Physical Exam  Nursing note and vitals reviewed. Psychiatric: Her mood appears anxious. Her speech is delayed. She is slowed and actively hallucinating. Thought content is paranoid. Cognition and memory are impaired. She expresses impulsivity.    Review of Systems  Neurological: Negative.   Psychiatric/Behavioral: Positive for hallucinations. The patient has insomnia.   All other systems reviewed and are negative.   Blood pressure (!) 158/90, pulse (!) 146, temperature 98.3 F (36.8 C), temperature source Oral, resp. rate (!) 22, height 5' 3" (1.6 m), weight 72.6 kg (160 lb), SpO2 98 %.Body mass index is 28.34 kg/m.  General Appearance: Fairly Groomed  Eye Contact:  Good  Speech:  Slow  Volume:  Decreased  Mood:  Anxious  Affect:  Congruent  Thought Process:  Disorganized and Descriptions of Associations: Loose  Orientation:  Full (Time, Place, and Person)  Thought Content:  Delusions, Hallucinations: Auditory Visual and Paranoid Ideation  Suicidal Thoughts:  No  Homicidal Thoughts:  No  Memory:  Immediate;    Poor Recent;   Poor Remote;   Poor  Judgement:  Poor  Insight:  Lacking  Psychomotor Activity:  Decreased  Concentration:  Concentration: Poor and Attention Span: Poor  Recall:  Poor  Fund of Knowledge:  Poor  Language:  Poor  Akathisia:  No  Handed:  Right  AIMS (if indicated):     Assets:  Desire for Improvement Financial Resources/Insurance Housing Resilience Social Support  ADL's:  Intact  Cognition:  WNL  Sleep:  Number of Hours: 5.45     Treatment Plan Summary: Daily contact with patient to assess and evaluate symptoms and progress in treatment and Medication management   Ms. Carreno is a 72 year old female with a history of bipolar disorder and ESRD admitted for psychotic break.  #Mood/psychosis -discontinue Abilify and Seroquel -continue Tegretol 200 mg BID, Tegretol level 11 -continue haldol 1 mg TID -start Lithium 300 mg BID  #Autonomic instability -medicine consult is greatly appreciated  #UTI -completed course of Keflex  #Dialyses -nephrology input is greatly appreciated - the patient will continue with dialysis -Midrin before each treatment  #GERD -Protonix 40 mg daily  #Glaucoma -continue eye drops  #Vitamin deficiency -continue B12 and D -vit B12 level  #Admission status -voluntary admission  #Disposition -discharge to home -follow up with Joyce. Bryna Colander, Joyce [name] 09/24/2017, 5:50 PM

## 2017-09-25 LAB — BASIC METABOLIC PANEL
Anion gap: 16 — ABNORMAL HIGH (ref 5–15)
BUN: 40 mg/dL — AB (ref 6–20)
CHLORIDE: 96 mmol/L — AB (ref 101–111)
CO2: 26 mmol/L (ref 22–32)
Calcium: 9.3 mg/dL (ref 8.9–10.3)
Creatinine, Ser: 6.61 mg/dL — ABNORMAL HIGH (ref 0.44–1.00)
GFR calc Af Amer: 7 mL/min — ABNORMAL LOW (ref >60)
GFR calc non Af Amer: 6 mL/min — ABNORMAL LOW (ref >60)
GLUCOSE: 170 mg/dL — AB (ref 65–99)
POTASSIUM: 3 mmol/L — AB (ref 3.5–5.1)
Sodium: 138 mmol/L (ref 135–145)

## 2017-09-25 MED ORDER — ENSURE ENLIVE PO LIQD
237.0000 mL | Freq: Two times a day (BID) | ORAL | Status: DC
  Administered 2017-09-26 – 2017-09-27 (×2): 237 mL via ORAL

## 2017-09-25 MED ORDER — LITHIUM CITRATE 300 MG/5 ML PO SYRP
600.0000 mg | Freq: Once | ORAL | Status: AC
  Administered 2017-09-25: 600 mg via ORAL
  Filled 2017-09-25: qty 10

## 2017-09-25 MED ORDER — RENA-VITE PO TABS
1.0000 | ORAL_TABLET | Freq: Every day | ORAL | Status: DC
  Administered 2017-09-26 – 2017-10-21 (×23): 1 via ORAL
  Filled 2017-09-25 (×31): qty 1

## 2017-09-25 MED ORDER — LITHIUM CITRATE 300 MG/5 ML PO SYRP
300.0000 mg | Freq: Two times a day (BID) | ORAL | Status: DC
  Administered 2017-09-26 – 2017-09-27 (×4): 300 mg via ORAL
  Filled 2017-09-25 (×5): qty 5

## 2017-09-25 NOTE — Progress Notes (Signed)
Nursing 1:1 note  Patient off unit for dialysis.

## 2017-09-25 NOTE — Progress Notes (Signed)
Nursing 1:1 note D:Pt observed sleeping in bed with eyes closed. RR even and unlabored. No distress noted. A: 1:1 observation continues for safety  R: pt remains safe  

## 2017-09-25 NOTE — Progress Notes (Signed)
Nursing 1:1 note D:Pt observed  in bed with eyes open. RR even and unlabored. No distress noted.  A: 1:1 observation continues for safety  R: pt remains safe

## 2017-09-25 NOTE — [facility] Group Notes (Signed)
[facility] Group Notes:  (Nursing/MHT/Case Management/Adjunct)  Date:  09/25/2017  Time:  12:47 AM  Type of Therapy:  Group Therapy  Participation Level:  Did Not Attend  [name] 09/25/2017, 12:47 AM

## 2017-09-25 NOTE — Progress Notes (Signed)
Nursing 1:1 note D:Pt observed  In room. Patient took medications without difficulty. Patient scheduled for dialysis and report called to [facility]. Patient taken to dialysis by sitter.  RR even and unlabored. No distress noted.  A: 1:1 observation continues for safety  R: pt remains safe

## 2017-09-25 NOTE — Progress Notes (Signed)
Initial Nutrition Assessment  DOCUMENTATION CODES:   Not applicable  INTERVENTION:   Ensure Enlive po BID, each supplement provides 350 kcal and 20 grams of protein  Rena-vite daily   NUTRITION DIAGNOSIS:   Increased nutrient needs related to chronic illness(ESRD on HD) as evidenced by increased estimated needs from protein.  GOAL:   Patient will meet greater than or equal to 90% of their needs  MONITOR:   PO intake, Supplement acceptance, Labs, Weight trends, I & O's  REASON FOR ASSESSMENT:   Malnutrition Screening Tool    ASSESSMENT:   72 year old female with a history of bipolar disorder and ESRD admitted for psychotic break.   Pt with poor appetite and oral intake pta. Per chart, pt appears to have had some weight loss; however admit weight appears reported so unsure if any true weight loss. Pt on chronic HD. Pt currently eating 25% of her meals in hospital. Pt ordered for Ensure; can have Nepro if she preferes. RD will add Rena-vite.   Medications reviewed and include: vitamin D, B12, remeron, protonix   Labs reviewed: K 3.0(L), Cl 96(L), BUN 40(H), creat 6.61(H) P 3.4 wnl- 3/6  Diet Order:  Diet regular Room service appropriate? Yes; Fluid consistency: Thin  EDUCATION NEEDS:   No education needs have been identified at this time  Skin: Reviewed RN Assessment  Last BM:  pta  Height:   Ht Readings from Last 1 Encounters:  09/18/17 5\' 3"  (1.6 m)    Weight:   Wt Readings from Last 1 Encounters:  09/16/17 191 lb (86.6 kg)    Ideal Body Weight:  52.3 kg  BMI:  Body mass index is 28.34 kg/m.  Estimated Nutritional Needs:   Kcal:  1400-1600kcal/day   Protein:  73-87g/day   Fluid:  per nephrology   Joyce Distance MS, RD, LDN Pager #[phone] After Hours Pager: [phone]

## 2017-09-25 NOTE — Progress Notes (Signed)
Pre HD assessment  

## 2017-09-25 NOTE — Progress Notes (Addendum)
[facility] MD Progress Note  09/25/2017 2:47 PM Joyce [name]  MRN:  [id number]  Subjective:   Joyce [name] took dialysis today. She is eating lunch but not really. She ate a spoonful of mac and cheese and refused to eat her chcken sandwich. She shows it to me to convince me that there is a fish in her sandwich with tail and fins. She also believes that a fish has gotten into her blood during dialysis that was not done well. She marvels at her fistula. She worries that she'd be arrested for some crimes she did not commit but wants to take responsibility for. She knows it because she can "hear the music". She is very upset as she knows that her sister, who visited last night, "did not get home". In spite of severe psychotic symptoms, she appears slightly better physically. She is in a wheel chair in the dining area accompanied by a sitter. She sips a little water, completed her menu but otherwise writes senseless notes.  Spoke with Dr. Bobbye Charleston who adjusted her Midrin. No source of possible delirium identified.   Treatment plan. We continue Tegretol, Haldol 1 mg TID and started Lithium. Added Remeron 15 mg nightly for sleep and appetite. We give a single dose of 600 mg today after dialysis and 300 mg BID tomorrow and Sunday. Consider ECT as the patient is psychotic, unable to eat or drink, hyperactive and will exhaust herself.   Social/disposition. It is unclear if the patient can continue to live independently. Follow up with Dr. Thurmond Butts.   Principal Problem: Bipolar affective disorder, current episode manic with psychotic symptoms ([place]) Diagnosis:   Patient Active Problem List   Diagnosis Date Noted  . Bipolar affective disorder, current episode manic with psychotic symptoms ([place]) [F31.2] 09/17/2017    Priority: High  . Bipolar I disorder, most recent episode (or current) manic ([place]) [F31.10] 09/02/2017  . Acute delirium [R41.0] 09/02/2017  . Altered mental status [R41.82]   . Acute encephalopathy  [G93.40] 09/01/2017  . ESRD on dialysis ([place]) [N18.6, Z99.2] 04/06/2017  . Complication of vascular access for dialysis [T82.9XXA] 04/06/2017  . Hematuria [R31.9] 12/18/2016  . Polycystic kidney [Q61.3] 10/12/2016  . Ruptured cyst of kidney [Q61.00] 10/03/2016  . HTN (hypertension) [I10] 10/01/2016  . Chronic kidney disease [N18.9] 10/01/2016  . Anemia associated with chronic renal failure [D63.1] 09/24/2016  . Acute kidney insufficiency [N28.9] 07/28/2016  . Noninfectious diarrhea [K52.9]   . Benign neoplasm of cecum [D12.0]   . Benign neoplasm of ascending colon [D12.2]   . Diarrhea [R19.7]   . Nausea [R11.0]   . Gastric polyp [K31.7]   . Chronic constipation [K59.09] 12/27/2014  . Nausea with vomiting [R11.2] 12/27/2014  . Left sided abdominal pain [R10.9] 12/27/2014  . Personal history of colonic polyps [Z86.010] 05/10/2013   Total Time spent with patient: 30 minutes  Past Psychiatric History: bipolar disorder  Past Medical History:  Past Medical History:  Diagnosis Date  . Anal fissure   . Bipolar affective disorder ([place])   . CKD (chronic kidney disease)    Dr Holley Raring [name] 4  . Colon polyps   . Diverticulitis   . Diverticulitis   . Family history of adverse reaction to anesthesia    mom - PONV  . GERD (gastroesophageal reflux disease)   . Headache    migraines - none over 10 yrs  . Heart murmur   . History of hiatal hernia   . Hypertension   . Pancreatitis  Valproic acid  . Vertigo     Past Surgical History:  Procedure Laterality Date  . A/V FISTULAGRAM Left 04/14/2017   Procedure: A/V Fistulagram;  Surgeon: Katha Cabal, MD;  Location: [place] [facility];  Service: Cardiovascular;  Laterality: Left;  . A/V FISTULAGRAM Left 06/09/2017   Procedure: A/V FISTULAGRAM;  Surgeon: Katha Cabal, MD;  Location: [place] [facility];  Service: Cardiovascular;  Laterality: Left;  . ABDOMINAL HYSTERECTOMY  1990 ?  . AV FISTULA PLACEMENT  4/30  . BREAST  EXCISIONAL BIOPSY Left 1994   neg surgical bx  . CHOLECYSTECTOMY  2003  . COLONOSCOPY  2014   Dr. Jamal Collin  . COLONOSCOPY WITH PROPOFOL N/A 09/24/2015   Procedure: COLONOSCOPY WITH random colon byopies.;  Surgeon: Lucilla Lame, MD;  Location: [place];  Service: Endoscopy;  Laterality: N/A;  . ESOPHAGOGASTRODUODENOSCOPY (EGD) WITH PROPOFOL N/A 09/24/2015   Procedure: ESOPHAGOGASTRODUODENOSCOPY (EGD) ;  Surgeon: Lucilla Lame, MD;  Location: [place];  Service: Endoscopy;  Laterality: N/A;  . EYE SURGERY    . PERIPHERAL VASCULAR CATHETERIZATION N/A 05/29/2015   Procedure: A/V Shuntogram/Fistulagram;  Surgeon: Katha Cabal, MD;  Location: [place] [facility];  Service: Cardiovascular;  Laterality: N/A;  . PERIPHERAL VASCULAR CATHETERIZATION N/A 05/29/2015   Procedure: A/V Shunt Intervention;  Surgeon: Katha Cabal, MD;  Location: [place] [facility];  Service: Cardiovascular;  Laterality: N/A;  . POLYPECTOMY  09/24/2015   Procedure: POLYPECTOMY INTESTINAL;  Surgeon: Lucilla Lame, MD;  Location: [facility];  Service: Endoscopy;;  cecal polyp ascending polyp   Family History:  Family History  Problem Relation Age of Onset  . Stroke Father   . Hypertension Father   . Breast cancer Other   . Colon cancer Neg Hx   . Liver disease Neg Hx    Family Psychiatric  History: none reported Social History:  Social History   Substance and Sexual Activity  Alcohol Use No  . Alcohol/week: 0.0 oz     Social History   Substance and Sexual Activity  Drug Use No    Social History   Socioeconomic History  . Marital status: Single    Spouse name: None  . Number of children: 1  . Years of education: None  . Highest education level: None  Social Needs  . Financial resource strain: None  . Food insecurity - worry: None  . Food insecurity - inability: None  . Transportation needs - medical: None  . Transportation needs - non-medical: None  Occupational History   . Occupation: Surveyor, quantity: [employer]  Tobacco Use  . Smoking status: Never Smoker  . Smokeless tobacco: Never Used  Substance and Sexual Activity  . Alcohol use: No    Alcohol/week: 0.0 oz  . Drug use: No  . Sexual activity: Not Currently  Other Topics Concern  . 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   Additional Social History:    History of alcohol / drug use?: No history of alcohol / drug abuse                    Sleep: Fair  Appetite:  Poor  Current Medications: Current Facility-Administered Medications  Medication Dose Route Frequency Provider Last Rate Last Dose  . acetaminophen (TYLENOL) tablet 325 mg  325 mg Oral BID PRN Clapacs, [name], MD   325 mg  at 09/25/17 0540  . alum & mag hydroxide-simeth (MAALOX/MYLANTA) 200-200-20 MG/5ML suspension 30 mL  30 mL Oral Q4H PRN Clapacs, [name], MD      . bisacodyl (DULCOLAX) EC tablet 5 mg  5 mg Oral Daily PRN Clapacs, [name], MD      . brimonidine (ALPHAGAN) 0.2 % ophthalmic solution 1 drop  1 drop Both Eyes BID Clapacs, [name], MD   1 drop at 09/25/17 0856  . carbamazepine (TEGRETOL) tablet 200 mg  200 mg Oral BID Clapacs, [name], MD   200 mg at 09/25/17 0855  . cholecalciferol (VITAMIN D) tablet 1,000 Units  1,000 Units Oral Daily Clapacs, [name], MD   1,000 Units at 09/25/17 0855  . [START ON 10/17/2017] cyanocobalamin ((VITAMIN B-12)) injection 1,000 mcg  1,000 mcg Intramuscular Q30 days Clapacs, [name], MD      . dicyclomine (BENTYL) tablet 20 mg  20 mg Oral TID AC Clapacs, [name], MD   20 mg at 09/25/17 1300  . [START ON 09/26/2017] feeding supplement (ENSURE ENLIVE) (ENSURE ENLIVE) liquid 237 mL  237 mL Oral BID BM Jerson Furukawa B, MD      . haloperidol (HALDOL) tablet 1 mg  1 mg Oral TID Willma Obando B, MD   1 mg at 09/25/17 1300  . lidocaine-prilocaine (EMLA) cream 1 application  1 application Topical Q M,W,F  Clapacs, [name], MD      . Derrill Memo ON 09/26/2017] lithium citrate 300 MG/5ML solution 300 mg  300 mg Oral BID AC & HS Seraya Jobst B, MD      . lithium citrate 300 MG/5ML solution 600 mg  600 mg Oral Once Kashawn Manzano B, MD      . magnesium hydroxide (MILK OF MAGNESIA) suspension 30 mL  30 mL Oral Daily PRN Clapacs, [name], MD      . midodrine (PROAMATINE) tablet 10 mg  10 mg Oral TID WC [name], MD   10 mg at 09/25/17 1300  . mirtazapine (REMERON) tablet 15 mg  15 mg Oral QHS Dametrius Sanjuan B, MD      . multivitamin (RENA-VIT) tablet 1 tablet  1 tablet Oral QHS Amie Cowens B, MD      . pantoprazole (PROTONIX) EC tablet 40 mg  40 mg Oral Daily Clapacs, [name], MD   40 mg at 09/25/17 0855  . timolol (TIMOPTIC) 0.5 % ophthalmic solution 1 drop  1 drop Both Eyes BID Clapacs, [name], MD   1 drop at 09/25/17 0856    Lab Results:  Results for orders placed or performed during the hospital encounter of 09/17/17 (from the past 48 hour(s))  Basic metabolic panel     Status: Abnormal   Collection Time: 09/25/17 10:43 AM  Result Value Ref Range   Sodium 138 135 - 145 mmol/L   Potassium 3.0 (L) 3.5 - 5.1 mmol/L   Chloride 96 (L) 101 - 111 mmol/L   CO2 26 22 - 32 mmol/L   Glucose, Bld 170 (H) 65 - 99 mg/dL   BUN 40 (H) 6 - 20 mg/dL   Creatinine, Ser 6.61 (H) 0.44 - 1.00 mg/dL   Calcium 9.3 8.9 - 10.3 mg/dL   GFR calc non Af Amer 6 (L) >60 mL/min   GFR calc Af Amer 7 (L) >60 mL/min    Comment: (NOTE) The eGFR has been calculated using the CKD EPI equation. This calculation has not been validated in all clinical situations. eGFR's persistently <60  mL/min signify possible Chronic Kidney Disease.    Anion gap 16 (H) 5 - 15    Comment: Performed at [facility], [address] [place], [place] [address]    Blood Alcohol level:  Lab Results  Component Value Date   [facility] <10 84/53/6468    Metabolic Disorder Labs: Lab Results  Component Value Date    HGBA1C 4.8 09/18/2017   MPG 91.06 09/18/2017   No results found for: PROLACTIN Lab Results  Component Value Date   CHOL 251 (H) 09/18/2017   TRIG 124 09/18/2017   HDL 66 09/18/2017   CHOLHDL 3.8 09/18/2017   VLDL 25 09/18/2017   LDLCALC 160 (H) 09/18/2017   LDLCALC 43 05/14/2014    Physical Findings: AIMS: Facial and Oral Movements Muscles of Facial Expression: None, normal Lips and Perioral Area: None, normal Jaw: None, normal Tongue: None, normal,Extremity Movements Upper (arms, wrists, hands, fingers): None, normal Lower (legs, knees, ankles, toes): None, normal, Trunk Movements Neck, shoulders, hips: None, normal, Overall Severity Severity of abnormal movements (highest score from questions above): None, normal Incapacitation due to abnormal movements: None, normal Patient's awareness of abnormal movements (rate only patient's report): No Awareness, Dental Status Current problems with teeth and/or dentures?: No Does patient usually wear dentures?: No  CIWA:    COWS:     Musculoskeletal: Strength & Muscle Tone: decreased Gait & Station: unsteady Patient leans: N/A  Psychiatric Specialty Exam: Physical Exam  Nursing note and vitals reviewed. Psychiatric: Her speech is normal. Her mood appears anxious. Her affect is inappropriate. She is actively hallucinating. Thought content is paranoid and delusional. Cognition and memory are impaired. She expresses inappropriate judgment.    Review of Systems  Neurological: Negative.   Psychiatric/Behavioral: Positive for hallucinations. The patient is nervous/anxious.   All other systems reviewed and are negative.   Blood pressure (!) 142/49, pulse (!) 56, temperature (!) 97.5 F (36.4 C), temperature source Oral, resp. rate 14, height _0  (1.6 m), weight 72.6 kg (160 lb), SpO2 100 %.Body mass index is 28.34 kg/m.  General Appearance: Casual  Eye Contact:  Good  Speech:  Normal Rate  Volume:  Normal  Mood:  Anxious and  Dysphoric  Affect:  Congruent  Thought Process:  Goal Directed and Descriptions of Associations: Intact  Orientation:  Full (Time, Place, and Person)  Thought Content:  Delusions, Hallucinations: Auditory Visual and Paranoid Ideation  Suicidal Thoughts:  No  Homicidal Thoughts:  No  Memory:  Immediate;   Fair Recent;   Fair Remote;   Fair  Judgement:  Poor  Insight:  Lacking  Psychomotor Activity:  Increased  Concentration:  Concentration: Fair and Attention Span: Fair  Recall:  [employer] of Knowledge:  Fair  Language:  Fair  Akathisia:  No  Handed:  Right  AIMS (if indicated):     Assets:  Communication Skills Desire for Improvement Financial Resources/Insurance Housing Resilience Social Support  ADL's:  Intact  Cognition:  WNL  Sleep:  Number of Hours: 8.3     Treatment Plan Summary: Daily contact with patient to assess and evaluate symptoms and progress in treatment and Medication management   Joyce [name] is a 72 year old female with a history of bipolar disorder and ESRD admitted for psychotic break.  #Mood/psychosis -discontinueAbilifyand Seroquel -continue Tegretol 200 mg BID, Tegretol level 11 -continue haldol 1 mg TID -continue Lithium 600 mg today, 300 mg BID on Sat and Sun -consider ECT  #Autonomic instability -medicine consult is greatly appreciated  #  Poor oral intake -Ensure TID -MVI -Remeron 15 mg nightly  #UTI -completed course of Keflex  #Dialyses -nephrology input is greatly appreciated - the patient will continue with dialysis -Midrin before each treatment  #GERD -Protonix 40 mg daily  #Glaucoma -continue eye drops  #Vitamin deficiency -continue B12 and D -vit B12 level 968  #Admission status -voluntary admission  #Disposition -discharge to home -follow up with Dr. Bryna Colander, MD 09/25/2017, 2:47 PM

## 2017-09-25 NOTE — Progress Notes (Signed)
[facility]  ROUNDING NOTE   Subjective:   Pleasant this morning Seen during HD treatment Sitter at bedside   HEMODIALYSIS FLOWSHEET:  Blood Flow Rate (mL/min): 250 mL/min Arterial Pressure (mmHg): -40 mmHg Venous Pressure (mmHg): 260 mmHg Transmembrane Pressure (mmHg): 50 mmHg Ultrafiltration Rate (mL/min): 150 mL/min Dialysate Flow Rate (mL/min): 800 ml/min Conductivity: Machine : 14 Conductivity: Machine : 14 Dialysis Fluid Bolus: Normal Saline Bolus Amount (mL): 250 mL  Slow BFR due to high venous pressures    Objective:  Vital signs in last 24 hours:  Temp:  [97.9 F (36.6 C)-98.7 F (37.1 C)] 97.9 F (36.6 C) (03/08 0920) Pulse Rate:  [61-158] 69 (03/08 1130) Resp:  [13-20] 20 (03/08 1130) BP: (104-135)/(51-88) 122/51 (03/08 1130) SpO2:  [92 %-100 %] 92 % (03/08 1130)  Weight change:  Filed Weights    Intake/Output: I/O last 3 completed shifts: In: 120 [P.O.:120] Out: -    Intake/Output this shift:  No intake/output data recorded.  Physical Exam: General: NAD, sitting in chair  Head: Normocephalic, atraumatic. Moist oral mucosal membranes  Eyes: Anicteric  Neck: Supple, trachea midline  Lungs:  Clear to auscultation  Heart: Regular rate and rhythm  Abdomen:  Soft, nontender  Extremities: no peripheral edema.  Neurologic: Nonfocal, moving all four extremities  Skin: No lesions  Access: Left AVF    Basic Metabolic Panel: Recent Labs  Lab 09/18/17 1248 09/19/17 0639 09/22/17 0014 09/23/17 1108  NA 140 140 140  --   K 2.8* 3.9 3.1*  --   CL 101 97* 95*  --   CO2 26 31 31   --   GLUCOSE 117* 117* 111*  --   BUN 23* 16 16  --   CREATININE 3.08* 3.49* 2.97*  --   CALCIUM 8.7* 9.6 9.6  --   PHOS 2.0*  --  2.6 3.4    Liver Function Tests: Recent Labs  Lab 09/18/17 1248 09/22/17 0014  ALBUMIN 3.9 4.4   No results for input(s): LIPASE, AMYLASE in the last 168 hours. Recent Labs  Lab 09/23/17 0702  AMMONIA 13     CBC: Recent Labs  Lab 09/22/17 0014  WBC 7.7  HGB 12.8  HCT 38.7  MCV 94.7  PLT 252    Cardiac Enzymes: Recent Labs  Lab 09/23/17 0045 09/23/17 0702 09/23/17 1108  TROPONINI 0.04* 0.05* 0.05*    BNP: Invalid input(s): POCBNP  CBG: Recent Labs  Lab 09/22/17 1914  GLUCAP 128*    Microbiology: Results for orders placed or performed during the hospital encounter of 09/01/17  Urine culture     Status: Abnormal   Collection Time: 09/01/17  4:32 PM  Result Value Ref Range Status   Specimen Description   Final    URINE, RANDOM Performed at [facility], [address] [place], [place] [address]    Special Requests   Final    NONE Performed at [facility], [address] [place], [place] [address]    Culture MULTIPLE SPECIES PRESENT, SUGGEST RECOLLECTION (A)  Final   Report Status 09/03/2017 FINAL  Final  Culture, blood (routine x 2)     Status: None   Collection Time: 09/01/17  6:05 PM  Result Value Ref Range Status   Specimen Description BLOOD RIGHT HAND  Final   Special Requests   Final    BOTTLES DRAWN AEROBIC AND ANAEROBIC Blood Culture results may not be optimal due to an excessive volume of blood received in culture bottles  Culture   Final    NO GROWTH 5 DAYS Performed at [facility], [address] [place], [place] [address]    Report Status 09/06/2017 FINAL  Final  Culture, blood (routine x 2)     Status: None   Collection Time: 09/01/17  6:34 PM  Result Value Ref Range Status   Specimen Description BLOOD RAC  Final   Special Requests   Final    BOTTLES DRAWN AEROBIC AND ANAEROBIC Blood Culture adequate volume   Culture   Final    NO GROWTH 5 DAYS Performed at [facility], [address], [place], [place] [address]    Report Status 09/06/2017 FINAL  Final    Coagulation Studies: No results for input(s): LABPROT, INR in the last 72 hours.  Urinalysis: No results for input(s): COLORURINE,  LABSPEC, PHURINE, GLUCOSEU, HGBUR, BILIRUBINUR, KETONESUR, PROTEINUR, UROBILINOGEN, NITRITE, LEUKOCYTESUR in the last 72 hours.  Invalid input(s): APPERANCEUR    Imaging: Dg Chest 2 View  Result Date: 09/24/2017 CLINICAL DATA:  Altered mental status EXAM: CHEST - 2 VIEW COMPARISON:  Chest radiograph 09/01/2016 FINDINGS: Shallow lung inflation with right basilar atelectasis. No other consolidation. No pleural effusion or pneumothorax. Normal cardiomediastinal contours. IMPRESSION: Shallow lung inflation with right basilar atelectasis. Electronically Signed   By: Ulyses Jarred M.D.   On: 09/24/2017 15:51     Medications:    . brimonidine  1 drop Both Eyes BID  . carbamazepine  200 mg Oral BID  . cholecalciferol  1,000 Units Oral Daily  . [START ON 10/17/2017] cyanocobalamin  1,000 mcg Intramuscular Q30 days  . dicyclomine  20 mg Oral TID AC  . feeding supplement (ENSURE ENLIVE)  237 mL Oral TID BM  . haloperidol  1 mg Oral TID  . lidocaine-prilocaine  1 application Topical Q M,W,F  . midodrine  10 mg Oral TID WC  . mirtazapine  15 mg Oral QHS  . pantoprazole  40 mg Oral Daily  . timolol  1 drop Both Eyes BID   acetaminophen, alum & mag hydroxide-simeth, bisacodyl, magnesium hydroxide  Assessment/ Plan:  [name] is a 72 y.o. white female with end stage renal disease on hemodialysis, diverticulosis, hypotension, bipolar disorder, polycystic kidney disease   [facility]/MWF  1.  ESRD on HD MWF.   Patient seen during dialysis Tolerating well   2.  Anemia of CKD: Hold off on epogen at this time.  Hgb 12.8  3.  Secondary hyperparathyroidism.  Phos at 3.4 at last check, continue to monitor.   4. Hypertension:  continue midodrine prior to dialysis.  No fluid removed with HD today   LOS: 8 Joyce [name] 3/8/201911:47 AM

## 2017-09-25 NOTE — Progress Notes (Signed)
Nursing 1:1 note D:Pt observed in room in bed talking to sitter. RR even and unlabored. No distress noted. A: 1:1 observation continues for safety  R: pt remains safe

## 2017-09-25 NOTE — Progress Notes (Signed)
Nursing 1:1 note D:Pt observed in dayroom watching television.  RR even and unlabored. No distress noted.  A: 1:1 observation continues for safety  R: pt remains safe

## 2017-09-25 NOTE — Progress Notes (Signed)
Nursing 1:1 note D:Pt awakened by staff to get vital signs, pt complained of chest pain 5 out 10 and given tylenol per [facility] . Pt appears to be in no distress with non-labored breathing. Pt expresses pain on movement.  A: 1:1 observation continues for safety  R: pt remains safe

## 2017-09-25 NOTE — Progress Notes (Signed)
Nursing 1:1 note D:Pt attending group. RR even and unlabored. No distress noted. A: 1:1 observation continues for safety  R: pt remains safe

## 2017-09-25 NOTE — Progress Notes (Signed)
Patient ID: Joyce [name], female   DOB: 1946/06/11, 72 y.o.   MRN: [id number]  [facility] PROGRESS NOTE  Joyce [name] JKK:[id number] DOB: [birth year] DOA: 09/17/2017 PCP: Cletis Athens, MD  HPI/Subjective: Patient seen while eating lunch.  She was fixing her coffee and eating some macaroni and cheese.  She stated that she does not need to be here anymore.  She is still hard to focus in on questions.  I encouraged her to eat.  Objective: Vitals:   09/25/17 1210 09/25/17 1218  BP: 122/60 (!) 142/49  Pulse: (!) 57 (!) 56  Resp: 15 14  Temp:  (!) 97.5 F (36.4 C)  SpO2: 100% 100%    Filed Weights    ROS: Review of Systems  Unable to perform ROS: Psychiatric disorder  Respiratory: Negative for shortness of breath.   Cardiovascular: Negative for chest pain.  Gastrointestinal: Negative for abdominal pain.  Genitourinary: Negative for dysuria.  Musculoskeletal: Negative for joint pain.   Exam: Physical Exam  HENT:  Nose: No mucosal edema.  Mouth/Throat: No oropharyngeal exudate or posterior oropharyngeal edema.  Eyes: Conjunctivae and lids are normal. Pupils are equal, round, and reactive to light.  Neck: No JVD present. Carotid bruit is not present. No edema present. No thyroid mass and no thyromegaly present.  Cardiovascular: S1 normal and S2 normal. Exam reveals no gallop.  No murmur heard. Pulses:      Dorsalis pedis pulses are 2+ on the right side, and 2+ on the left side.  Respiratory: No respiratory distress. She has no wheezes. She has no rhonchi. She has no rales.  GI: Soft. Bowel sounds are normal. There is no tenderness.  Musculoskeletal:       Right ankle: She exhibits no swelling.       Left ankle: She exhibits no swelling.  Lymphadenopathy:    She has no cervical adenopathy.  Neurological: She is alert. No cranial nerve deficit.  Skin: Skin is warm. No rash noted. Nails show no clubbing.  Psychiatric: Her speech is normal. Her affect is blunt.       Data Reviewed: Basic Metabolic Panel: Recent Labs  Lab 09/19/17 0639 09/22/17 0014 09/23/17 1108 09/25/17 1043  NA 140 140  --  138  K 3.9 3.1*  --  3.0*  CL 97* 95*  --  96*  CO2 31 31  --  26  GLUCOSE 117* 111*  --  170*  BUN 16 16  --  40*  CREATININE 3.49* 2.97*  --  6.61*  CALCIUM 9.6 9.6  --  9.3  PHOS  --  2.6 3.4  --    Liver Function Tests: Recent Labs  Lab 09/22/17 0014  ALBUMIN 4.4    Recent Labs  Lab 09/23/17 0702  AMMONIA 13   CBC: Recent Labs  Lab 09/22/17 0014  WBC 7.7  HGB 12.8  HCT 38.7  MCV 94.7  PLT 252   Cardiac Enzymes: Recent Labs  Lab 09/23/17 0045 09/23/17 0702 09/23/17 1108  TROPONINI 0.04* 0.05* 0.05*    CBG: Recent Labs  Lab 09/22/17 1914  GLUCAP 128*      Studies: Dg Chest 2 View  Result Date: 09/24/2017 CLINICAL DATA:  Altered mental status EXAM: CHEST - 2 VIEW COMPARISON:  Chest radiograph 09/01/2016 FINDINGS: Shallow lung inflation with right basilar atelectasis. No other consolidation. No pleural effusion or pneumothorax. Normal cardiomediastinal contours. IMPRESSION: Shallow lung inflation with right basilar atelectasis. Electronically Signed   By: Cletus Gash.D.  On: 09/24/2017 15:51    Scheduled Meds: . brimonidine  1 drop Both Eyes BID  . carbamazepine  200 mg Oral BID  . cholecalciferol  1,000 Units Oral Daily  . [START ON 10/17/2017] cyanocobalamin  1,000 mcg Intramuscular Q30 days  . dicyclomine  20 mg Oral TID AC  . [START ON 09/26/2017] feeding supplement (ENSURE ENLIVE)  237 mL Oral BID BM  . haloperidol  1 mg Oral TID  . lidocaine-prilocaine  1 application Topical Q M,W,F  . [START ON 09/26/2017] lithium citrate  300 mg Oral BID AC & HS  . lithium citrate  600 mg Oral Once  . midodrine  10 mg Oral TID WC  . mirtazapine  15 mg Oral QHS  . multivitamin  1 tablet Oral QHS  . pantoprazole  40 mg Oral Daily  . timolol  1 drop Both Eyes BID   Continuous  Infusions:  Assessment/Plan:  1. Orthostatic hypotension with syncope. Secondary to poor appetite and deconditioning.  We will get physical therapy evaluation.  Continue midodrine 10 mg 3 times daily.  Would not give fluids in a dialysis patient.  Patient needs to eat and drink better.  Patient is still orthostatic but blood pressure above 100 when standing.  Would continue midodrine 10 mg 3 times a day at this point.  If blood pressure becomes very high may be able to cut back to 5 mg 3 times daily.  I will leave this up to nephrology.  I will sign off at this time. 2. End-stage renal disease on dialysis as per nephrology. 3. Bipolar disorder with paranoid component.  Continue psychiatric medications as per psychiatry 4. GERD on Protonix 5. Glaucoma on eyedrops 6. Impaired fasting glucose.  Patient is not a diabetic with hemoglobin A1c of 4.8.  Code Status:     Code Status Orders  (From admission, onward)        Start     Ordered   09/17/17 2305  Full code  Continuous     09/17/17 2304    Code Status History    Date Active Date Inactive Code Status Order ID Comments User Context   09/01/2017 21:08 09/03/2017 19:00 Full Code [id number]  Dustin Flock, MD Inpatient   02/12/2017 18:16 02/15/2017 15:12 Full Code [id number]  Baxter Hire, MD Inpatient   02/09/2017 14:25 02/11/2017 19:17 Partial Code [id number]  Bettey Costa, MD Inpatient   02/09/2017 13:33 02/09/2017 14:25 Full Code [id number]  Bettey Costa, MD Inpatient   12/18/2016 18:43 12/20/2016 14:50 Full Code [id number]  Hillary Bow, MD ED   10/10/2016 19:34 10/12/2016 17:48 Full Code [id number]  Vaughan Basta, MD Inpatient   10/03/2016 11:43 10/06/2016 18:06 Full Code [id number]  Gladstone Lighter, MD Inpatient   07/28/2016 14:47 07/31/2016 16:04 DNR [id number]  Bettey Costa, MD ED    Advance Directive Documentation     Most Recent Value  Type of Advance Directive  Healthcare Power of Attorney  Pre-existing out of facility DNR order  (yellow form or pink MOST form)  No data  "MOST" Form in Place?  No data     Family Communication: As per psychiatry Disposition Plan: As per psychiatry  Consultants:  Psychiatry  Nephrology  Time spent: 24 minutes.  Case discussed with both Dr. Bary Leriche psychiatry and Dr. Candiss Norse nephrology.  I will sign off at this time.  Nephrology can follow for midodrine and orthostatic hypotension.  Dianna Deshler [employer]

## 2017-09-25 NOTE — Progress Notes (Signed)
Post HD assessment. Pt tolerated tx well without c/o. Tx terminated early r/t increased venous pressure despite BFR being decreased to lowest setting. MD made aware. Net UF -137.

## 2017-09-25 NOTE — Plan of Care (Signed)
  Safety: Ability to remain free from injury will improve 09/25/2017 2340 - Progressing by Providence Crosby, RN Note Pt safe on the unit at this time

## 2017-09-25 NOTE — Plan of Care (Signed)
D: Pt denies SI/HI/AV hallucinations. Pt is pleasant and cooperative. Patient is in good spirits today states she is "ready to go and take care of some things." Patient appetite is slowly improving today. She did not drink her supplements. Patient encouraged to drink at least one of them a day if not both. Patient continues with 1:1 sitter at bedside and remains safe on unit. A: Pt was offered support and encouragement. Pt was given scheduled medications. Pt was encourage to attend groups. Q 15 minute checks were done for safety.  R:Pt attends groups and interacts well with peers and staff. Pt is taking medication. Pt has no complaints.Pt receptive to treatment and safety maintained on unit.  Progressing Elimination: Will not experience complications related to bowel motility 09/25/2017 1508 - Progressing by Jolene Provost, RN Will not experience complications related to urinary retention 09/25/2017 1508 - Progressing by Jolene Provost, RN Pain Managment: General experience of comfort will improve 09/25/2017 1508 - Progressing by Jolene Provost, RN Education: Will be free of psychotic symptoms 09/25/2017 1508 - Progressing by Jolene Provost, RN Knowledge of the prescribed therapeutic regimen will improve 09/25/2017 1508 - Progressing by Jolene Provost, RN Coping: Ability to cope will improve 09/25/2017 1508 - Progressing by Jolene Provost, RN Ability to verbalize feelings will improve 09/25/2017 1508 - Progressing by Jolene Provost, [place] Behavior/Discharge Planning: Compliance with prescribed medication regimen will improve 09/25/2017 1508 - Progressing by Jolene Provost, RN Safety: Ability to redirect hostility and anger into socially appropriate behaviors will improve 09/25/2017 1508 - Progressing by Jolene Provost, RN Ability to remain free from injury will improve 09/25/2017 1508 - Progressing by Jolene Provost, RN Self-Concept: Ability to verbalize positive feelings about  self will improve 09/25/2017 1508 - Progressing by Jolene Provost, RN Activity: Ability to tolerate increased activity will improve 09/25/2017 1508 - Progressing by Jolene Provost, RN Coping: Development of coping mechanisms to deal with changes in body function or appearance will improve 09/25/2017 1508 - Progressing by Jolene Provost, RN Fluid Volume: Compliance with measures to maintain balanced fluid volume will improve 09/25/2017 1508 - Progressing by Jolene Provost, RN Physical Regulation: Complications related to the disease process, condition or treatment will be avoided or minimized 09/25/2017 1508 - Progressing by Jolene Provost, RN Activity: Sleeping patterns will improve 09/25/2017 1508 - Progressing by Jolene Provost, RN

## 2017-09-25 NOTE — Progress Notes (Signed)
Post HD assessment  

## 2017-09-25 NOTE — Progress Notes (Signed)
Nursing 1:1 note D:Pt observed in room sitting on bed after eating dinner.  RR even and unlabored. No distress noted.Pt used the bathroom at 2300  A: 1:1 observation continues for safety  R: pt remains safe

## 2017-09-25 NOTE — Progress Notes (Signed)
HD tx start 

## 2017-09-25 NOTE — Progress Notes (Signed)
Pt expressed her desire to not " go under the machine they placed on my chest again" , pt stated that was not comfortable for her.

## 2017-09-25 NOTE — Progress Notes (Signed)
D: Pt has been sleep since 1830 per check sheet. Pt appeared to be in no distress this evening. Will continue to monitor.  A: Pt was offered support and encouragement.  Pt was encourage to attend groups. Q 15 minute checks were done for safety.  R: safety maintained on unit.

## 2017-09-25 NOTE — [facility] Group Notes (Signed)
LCSW Group Therapy Note  09/25/2017 1:00 pm  Type of Therapy and Topic:  Group Therapy:  Feelings around Relapse and Recovery  Participation Level:  Did Not Attend   Description of Group:    Patients in this group will discuss emotions they experience before and after a relapse. They will process how experiencing these feelings, or avoidance of experiencing them, relates to having a relapse. Facilitator will guide patients to explore emotions they have related to recovery. Patients will be encouraged to process which emotions are more powerful. They will be guided to discuss the emotional reaction significant others in their lives may have to their relapse or recovery. Patients will be assisted in exploring ways to respond to the emotions of others without this contributing to a relapse.  Therapeutic Goals: 1. Patient will identify two or more emotions that lead to a relapse for them 2. Patient will identify two emotions that result when they relapse 3. Patient will identify two emotions related to recovery 4. Patient will demonstrate ability to communicate their needs through discussion and/or role plays   Summary of Patient Progress:     Therapeutic Modalities:   Cognitive Behavioral Therapy Solution-Focused Therapy Assertiveness Training Relapse Prevention Therapy   Devona Konig, LCSW 09/25/2017 2:53 PM

## 2017-09-25 NOTE — Progress Notes (Signed)
Report called to [facility] RN for patient to transport to dialysis.

## 2017-09-25 NOTE — Progress Notes (Signed)
Nursing 1:1 note D:Pt observed sleeping in bed with eyes closed. RR-19- even and unlabored. No distress noted. A: 1:1 observation continues for safety  R: pt remains safe

## 2017-09-25 NOTE — Progress Notes (Signed)
HD tx end. Tx terminated early r/t increased venous pressure despite decreased BFR to lowest setting . MD made aware.

## 2017-09-25 NOTE — Progress Notes (Signed)
Nursing 1:1 note D:Pt returned to unit from dialysis and is in dayroom eating lunch.RR even and unlabored. No distress noted.  A: 1:1 observation continues for safety  R: pt remains safe

## 2017-09-26 NOTE — Progress Notes (Signed)
Hourly Rounding 0800 Resting quietly in bed.  1:1 maintained.   0900 Resting quietly in bed.  1:1 maintained.   1000 Resting quietly in bed.  1:1 maintained.   1100 Resting quietly in bed.  1:1 maintained.   1200 In dayroom eating lunch.  Sitter at side.   1300 Resting quietly in bed.  1:1 maintained.   1400 Resting quietly in bed.  1:1 maintained.   1500 Resting quietly in bed with 1:1 sitter at bedside 1600 Resting in bed with eyes closed.  Sitter at bedside.  1700 Sitting on side of bed receiving medications.  1:1 maintained.   1800   Resting in bed with eyes closed.  1:1 maintained 1900 Resting in bed with eyes closed.  1:1 maitnained.    1730  Patient denies SI/HI/AVH.  Rambling conversation at times.  Verbalizes that she should not be here. Verbalizes chest discomfort with movement.  Support and encouragement offered.  Safety rounds maintained.

## 2017-09-26 NOTE — Progress Notes (Signed)
Nursing 1:1 note D:Pt observed sleeping in bed with eyes closed. RR even and unlabored. No distress noted. A: 1:1 observation continues for safety  R: pt remains safe  

## 2017-09-26 NOTE — Progress Notes (Signed)
Nursing 1:1 note D:Pt observed sitting in bed, pt complaining of chest pain on movement, pt given Tylenol per [facility]. A: 1:1 observation continues for safety  R: pt remains safe

## 2017-09-26 NOTE — Plan of Care (Signed)
Pt. Pain improved this evening. Pt. Compliant with medications this evening. Pt. Is able to verbalize she can remain safe while on the unit. Pt. Sleeping good per sleep reports. Pt. Continues this evening to present with psychotic features and confusion. Pt. Unable to verbalize feelings well this evening. Pt. Minimally engages with this Probation officer. Pt. Eating fair.    Not Progressing Education: Will be free of psychotic symptoms 09/26/2017 2209 - Not Progressing by Reyes Ivan, RN Knowledge of the prescribed therapeutic regimen will improve 09/26/2017 2209 - Not Progressing by Reyes Ivan, RN Coping: Ability to cope will improve 09/26/2017 2209 - Not Progressing by Reyes Ivan, RN Ability to verbalize feelings will improve 09/26/2017 2209 - Not Progressing by Reyes Ivan, RN Nutritional: Ability to achieve adequate nutritional intake will improve 09/26/2017 2209 - Not Progressing by Reyes Ivan, RN Self-Concept: Ability to verbalize positive feelings about self will improve 09/26/2017 2209 - Not Progressing by Reyes Ivan, RN   Progressing Pain Managment: General experience of comfort will improve 09/26/2017 2209 - Progressing by Reyes Ivan, RN Health Behavior/Discharge Planning: Compliance with prescribed medication regimen will improve 09/26/2017 2209 - Progressing by Reyes Ivan, RN Safety: Ability to remain free from injury will improve 09/26/2017 2209 - Progressing by Reyes Ivan, RN Activity: Sleeping patterns will improve 09/26/2017 2209 - Progressing by Reyes Ivan, RN

## 2017-09-26 NOTE — [facility] Group Notes (Signed)
LCSW Group Therapy Note  09/26/2017 1:15pm  Type of Therapy and Topic: Group Therapy: Holding on to Grudges   Participation Level: Did Not Attend   Description of Group:  In this group patients will be asked to explore and define a grudge. Patients will be guided to discuss their thoughts, feelings, and reasons as to why people have grudges. Patients will process the impact grudges have on daily life and identify thoughts and feelings related to holding grudges. Facilitator will challenge patients to identify ways to let go of grudges and the benefits this provides. Patients will be confronted to address why one struggles letting go of grudges. Lastly, patients will identify feelings and thoughts related to what life would look like without grudges. This group will be process-oriented, with patients participating in exploration of their own experiences, giving and receiving support, and processing challenge from other group members.  Therapeutic Goals:  1. Patient will identify specific grudges related to their personal life.  2. Patient will identify feelings, thoughts, and beliefs around grudges.  3. Patient will identify how one releases grudges appropriately.  4. Patient will identify situations where they could have let go of the grudge, but instead chose to hold on.   Summary of Patient Progress:   Therapeutic Modalities:  Cognitive Behavioral Therapy  Solution Focused Therapy  Motivational Interviewing  Brief Therapy   Consuelo Thayne  CUEBAS-COLON, LCSW 09/26/2017 3:24 PM

## 2017-09-26 NOTE — Progress Notes (Signed)
D:Pt denies SI/HI/AVH. Pt. Is able to verbalize she can remain safe while on the unit. Pt. Upon interaction is very minimally engaging and present with: confusion, tangential speech at times, disorganized though process, and lacks insight. Pt. appears disheveled. Pt. Presents less paranoid this evening and less apprehensive taking her night medications. Pt. Pain improved this evening. Pt. Sleeping good per sleep reports. Pt. Eating fair when she does eat, but overall poor. Pt. Very isolative and withdrawn. Pt. Dialysis fistula WNL.   A: Q x 15 minute observation checks were completed for safety. Patient was provided with education. Pt. Needs reinforcement on provided education. Currently shows little evidence of retaining education taught. Patient was given scheduled medications. Patient  was encourage to attend groups, participate in unit activities and continue with plan of care. Pt. Given frequent information/education/reminders to utilize incentive spirometer every hour. Pt. Demonstrates capability to utilize incentive spirometer appropriately after redirection and help. Pt. Given education to use wheelchair and call for assistance, for safety. Will continue to remind/re-educate to utilize wheelchair for safety.   R:Patient is complaint with medication and unit procedures. Pt. Does not attend groups.             Precautionary checks every 15 minutes for safety maintained, room free of safety hazards, patient sustains no injury or falls during this shift.   1:1 Maintained per MD orders for safety  2000: Pt. Observed resting in her room. Pt. Given incentive spirometer direction 2100:Pt. Observed resting in her room. Pt. Given incentive spirometer direction 2200:Pt. Observed resting in her room. Pt. Given incentive spirometer direction 2300: Pt. Observed sleeping at this time.  0000: Pt. Observed sleeping at this time.  0100: Pt. Observed sleeping at this time.  0200:Pt. Observed sleeping  at this time. 0300:Pt. Observed sleeping at this time. 0400:Pt. Observed sleeping at this time. 0500:Pt. Observed sleeping at this time. 0550:Pt. Observed sleeping at this time.

## 2017-09-26 NOTE — Progress Notes (Signed)
[facility] MD Progress Note  09/26/2017 2:18 PM Joyce [name]  MRN:  [id number] Subjective: Follow-up for 72 year old woman with a history of bipolar disorder also with multiple medical problems.  Chart reviewed.  Reviewed notes from hospitalist and psychiatrist.  Spoke with Dr. Mamie Nick yesterday.  Patient today was in bed awake but not very active.  Not eating very much.  In conversation she is very confused.  Talks about how she needs to go ahead and get the charges taken care of.  She cannot express to me what she means by that but seems to think that that is my fault.  She is not able to articulate any clear explanation for her confusion.  As noted the patient is now on Tegretol a low dose of lithium started by Dr. Mamie Nick and Haldol.  Concerns about her low blood pressure and not eating well.  She now has a one-to-one watching her regularly.  Patient has no insight Principal Problem: Bipolar affective disorder, current episode manic with psychotic symptoms ([place]) Diagnosis:   Patient Active Problem List   Diagnosis Date Noted  . Bipolar affective disorder, current episode manic with psychotic symptoms ([place]) [F31.2] 09/17/2017  . Bipolar I disorder, most recent episode (or current) manic ([place]) [F31.10] 09/02/2017  . Acute delirium [R41.0] 09/02/2017  . Altered mental status [R41.82]   . Acute encephalopathy [G93.40] 09/01/2017  . ESRD on dialysis ([place]) [N18.6, Z99.2] 04/06/2017  . Complication of vascular access for dialysis [T82.9XXA] 04/06/2017  . Hematuria [R31.9] 12/18/2016  . Polycystic kidney [Q61.3] 10/12/2016  . Ruptured cyst of kidney [Q61.00] 10/03/2016  . HTN (hypertension) [I10] 10/01/2016  . Chronic kidney disease [N18.9] 10/01/2016  . Anemia associated with chronic renal failure [D63.1] 09/24/2016  . Acute kidney insufficiency [N28.9] 07/28/2016  . Noninfectious diarrhea [K52.9]   . Benign neoplasm of cecum [D12.0]   . Benign neoplasm of ascending colon [D12.2]   . Diarrhea [R19.7]   .  Nausea [R11.0]   . Gastric polyp [K31.7]   . Chronic constipation [K59.09] 12/27/2014  . Nausea with vomiting [R11.2] 12/27/2014  . Left sided abdominal pain [R10.9] 12/27/2014  . Personal history of colonic polyps [Z86.010] 05/10/2013   Total Time spent with patient: 20 minutes  Past Psychiatric History: Patient has a history of bipolar disorder and had been reasonably stable until the last month or so when she seems to have decompensated badly  Past Medical History:  Past Medical History:  Diagnosis Date  . Anal fissure   . Bipolar affective disorder ([place])   . CKD (chronic kidney disease)    Dr Holley Raring [name] 4  . Colon polyps   . Diverticulitis   . Diverticulitis   . Family history of adverse reaction to anesthesia    mom - PONV  . GERD (gastroesophageal reflux disease)   . Headache    migraines - none over 10 yrs  . Heart murmur   . History of hiatal hernia   . Hypertension   . Pancreatitis    Valproic acid  . Vertigo     Past Surgical History:  Procedure Laterality Date  . A/V FISTULAGRAM Left 04/14/2017   Procedure: A/V Fistulagram;  Surgeon: Katha Cabal, MD;  Location: [facility];  Service: Cardiovascular;  Laterality: Left;  . A/V FISTULAGRAM Left 06/09/2017   Procedure: A/V FISTULAGRAM;  Surgeon: Katha Cabal, MD;  Location: [place] [facility];  Service: Cardiovascular;  Laterality: Left;  . ABDOMINAL HYSTERECTOMY  1990 ?  . AV  FISTULA PLACEMENT  4/30  . BREAST EXCISIONAL BIOPSY Left 1994   neg surgical bx  . CHOLECYSTECTOMY  2003  . COLONOSCOPY  2014   Dr. Jamal Collin  . COLONOSCOPY WITH PROPOFOL N/A 09/24/2015   Procedure: COLONOSCOPY WITH random colon byopies.;  Surgeon: Lucilla Lame, MD;  Location: [place];  Service: Endoscopy;  Laterality: N/A;  . ESOPHAGOGASTRODUODENOSCOPY (EGD) WITH PROPOFOL N/A 09/24/2015   Procedure: ESOPHAGOGASTRODUODENOSCOPY (EGD) ;  Surgeon: Lucilla Lame, MD;  Location: [place];  Service:  Endoscopy;  Laterality: N/A;  . EYE SURGERY    . PERIPHERAL VASCULAR CATHETERIZATION N/A 05/29/2015   Procedure: A/V Shuntogram/Fistulagram;  Surgeon: Katha Cabal, MD;  Location: [place] [facility];  Service: Cardiovascular;  Laterality: N/A;  . PERIPHERAL VASCULAR CATHETERIZATION N/A 05/29/2015   Procedure: A/V Shunt Intervention;  Surgeon: Katha Cabal, MD;  Location: [place] [facility];  Service: Cardiovascular;  Laterality: N/A;  . POLYPECTOMY  09/24/2015   Procedure: POLYPECTOMY INTESTINAL;  Surgeon: Lucilla Lame, MD;  Location: [place];  Service: Endoscopy;;  cecal polyp ascending polyp   Family History:  Family History  Problem Relation Age of Onset  . Stroke Father   . Hypertension Father   . Breast cancer Other   . Colon cancer Neg Hx   . Liver disease Neg Hx    Family Psychiatric  History: None Social History:  Social History   Substance and Sexual Activity  Alcohol Use No  . Alcohol/week: 0.0 oz     Social History   Substance and Sexual Activity  Drug Use No    Social History   Socioeconomic History  . Marital status: Single    Spouse name: None  . Number of children: 1  . Years of education: None  . Highest education level: None  Social Needs  . Financial resource strain: None  . Food insecurity - worry: None  . Food insecurity - inability: None  . Transportation needs - medical: None  . Transportation needs - non-medical: None  Occupational History  . Occupation: Surveyor, quantity: [employer]  Tobacco Use  . Smoking status: Never Smoker  . Smokeless tobacco: Never Used  Substance and Sexual Activity  . Alcohol use: No    Alcohol/week: 0.0 oz  . Drug use: No  . Sexual activity: Not Currently  Other Topics Concern  . 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   Additional Social History:    History of alcohol / drug use?: No history of  alcohol / drug abuse                    Sleep: Fair  Appetite:  Poor  Current Medications: Current Facility-Administered Medications  Medication Dose Route Frequency Provider Last Rate Last Dose  . acetaminophen (TYLENOL) tablet 325 mg  325 mg Oral BID PRN Crispin Vogel, [name], MD   325 mg at 09/26/17 0553  . alum & mag hydroxide-simeth (MAALOX/MYLANTA) 200-200-20 MG/5ML suspension 30 mL  30 mL Oral Q4H PRN Davinity Fanara T, MD      . bisacodyl (DULCOLAX) EC tablet 5 mg  5 mg Oral Daily PRN Ziare Cryder T, MD      . brimonidine (ALPHAGAN) 0.2 % ophthalmic solution 1 drop  1 drop Both Eyes BID Milton Sagona, [name], MD   1 drop at 09/26/17 0903  . carbamazepine (TEGRETOL) tablet 200 mg  200 mg Oral BID Chigozie Basaldua, [name], MD   200 mg at 09/26/17 0903  . cholecalciferol (VITAMIN D) tablet 1,000 Units  1,000 Units Oral Daily Perri Aragones, [name], MD   1,000 Units at 09/26/17 [phone]  . [START ON 10/17/2017] cyanocobalamin ((VITAMIN B-12)) injection 1,000 mcg  1,000 mcg Intramuscular Q30 days Jmya Uliano T, MD      . dicyclomine (BENTYL) tablet 20 mg  20 mg Oral TID AC Beckem Tomberlin, [name], MD   20 mg at 09/26/17 0903  . feeding supplement (ENSURE ENLIVE) (ENSURE ENLIVE) liquid 237 mL  237 mL Oral BID BM [name], [name], MD      . haloperidol (HALDOL) tablet 1 mg  1 mg Oral TID [name], [name], MD   1 mg at 09/26/17 0903  . lidocaine-prilocaine (EMLA) cream 1 application  1 application Topical Q M,W,F Vielka Klinedinst T, MD      . lithium citrate 300 MG/5ML solution 300 mg  300 mg Oral BID AC & HS [name], [name], MD   300 mg at 09/26/17 0904  . magnesium hydroxide (MILK OF MAGNESIA) suspension 30 mL  30 mL Oral Daily PRN Angeleena Dueitt T, MD      . midodrine (PROAMATINE) tablet 10 mg  10 mg Oral TID WC [name], MD   10 mg at 09/26/17 0903  . mirtazapine (REMERON) tablet 15 mg  15 mg Oral QHS [name], [name], MD      . multivitamin (RENA-VIT) tablet 1 tablet  1 tablet Oral QHS  [name], [name], MD      . pantoprazole (PROTONIX) EC tablet 40 mg  40 mg Oral Daily Jamice Carreno, [name], MD   40 mg at 09/26/17 0904  . timolol (TIMOPTIC) 0.5 % ophthalmic solution 1 drop  1 drop Both Eyes BID Sheily Lineman, [name], MD   1 drop at 09/26/17 0903    Lab Results:  Results for orders placed or performed during the hospital encounter of 09/17/17 (from the past 48 hour(s))  Basic metabolic panel     Status: Abnormal   Collection Time: 09/25/17 10:43 AM  Result Value Ref Range   Sodium 138 135 - 145 mmol/L   Potassium 3.0 (L) 3.5 - 5.1 mmol/L   Chloride 96 (L) 101 - 111 mmol/L   CO2 26 22 - 32 mmol/L   Glucose, Bld 170 (H) 65 - 99 mg/dL   BUN 40 (H) 6 - 20 mg/dL   Creatinine, Ser 6.61 (H) 0.44 - 1.00 mg/dL   Calcium 9.3 8.9 - 10.3 mg/dL   GFR calc non Af Amer 6 (L) >60 mL/min   GFR calc Af Amer 7 (L) >60 mL/min    Comment: (NOTE) The eGFR has been calculated using the CKD EPI equation. This calculation has not been validated in all clinical situations. eGFR's persistently <60 mL/min signify possible Chronic Kidney Disease.    Anion gap 16 (H) 5 - 15    Comment: Performed at [facility], [address] [place], [place] [address]    Blood Alcohol level:  Lab Results  Component Value Date   [facility] <10 56/38/7564    Metabolic Disorder Labs: Lab Results  Component Value Date   HGBA1C 4.8 09/18/2017   MPG 91.06 09/18/2017   No results found for: PROLACTIN Lab Results  Component Value Date   CHOL 251 (H) 09/18/2017   TRIG 124 09/18/2017   HDL 66 09/18/2017   CHOLHDL 3.8 09/18/2017   VLDL 25 09/18/2017  LDLCALC 160 (H) 09/18/2017   LDLCALC 43 05/14/2014    Physical Findings: AIMS: Facial and Oral Movements Muscles of Facial Expression: None, normal Lips and Perioral Area: None, normal Jaw: None, normal Tongue: None, normal,Extremity Movements Upper (arms, wrists, hands, fingers): None, normal Lower (legs, knees, ankles, toes): None, normal, Trunk  Movements Neck, shoulders, hips: None, normal, Overall Severity Severity of abnormal movements (highest score from questions above): None, normal Incapacitation due to abnormal movements: None, normal Patient's awareness of abnormal movements (rate only patient's report): No Awareness, Dental Status Current problems with teeth and/or dentures?: No Does patient usually wear dentures?: No  CIWA:    COWS:     Musculoskeletal: Strength & Muscle Tone: decreased Gait & Station: unsteady Patient leans: Front  Psychiatric Specialty Exam: Physical Exam  Nursing note and vitals reviewed. Constitutional: She appears well-developed.  HENT:  Head: Normocephalic and atraumatic.  Eyes: Conjunctivae are normal. Pupils are equal, round, and reactive to light.  Neck: Normal range of motion.  Cardiovascular: Regular rhythm and normal heart sounds.  Respiratory: Effort normal. No respiratory distress.  GI: Soft.  Musculoskeletal: Normal range of motion.  Neurological: She is alert.  Skin: Skin is warm and dry.  Psychiatric: Her mood appears anxious. Her affect is inappropriate. Her speech is tangential. She is agitated. She is not aggressive. Thought content is paranoid and delusional. Cognition and memory are impaired. She expresses impulsivity. She exhibits abnormal recent memory.    Review of Systems  Constitutional: Negative.   HENT: Negative.   Eyes: Negative.   Respiratory: Negative.   Cardiovascular: Negative.   Gastrointestinal: Negative.   Musculoskeletal: Negative.   Skin: Negative.   Neurological: Negative.   Psychiatric/Behavioral: Positive for memory loss. Negative for depression, hallucinations, substance abuse and suicidal ideas. The patient is nervous/anxious.     Blood pressure (!) 142/49, pulse (!) 56, temperature (!) 97.5 F (36.4 C), temperature source Oral, resp. rate 14, height '5\' 3"'$  (1.6 m), weight 72.6 kg (160 lb), SpO2 98 %.Body mass index is 28.34 kg/m.  General  Appearance: Disheveled  Eye Contact:  Poor  Speech:  Slow  Volume:  Decreased  Mood:  Anxious  Affect:  Congruent  Thought Process:  Disorganized  Orientation:  Negative  Thought Content:  Illogical, Delusions, Paranoid Ideation and Tangential  Suicidal Thoughts:  No  Homicidal Thoughts:  No  Memory:  Immediate;   Poor Recent;   Poor Remote;   Poor  Judgement:  Impaired  Insight:  Lacking  Psychomotor Activity:  Decreased  Concentration:  Concentration: Poor  Recall:  Poor  Fund of Knowledge:  Fair  Language:  Fair  Akathisia:  No  Handed:  Right  AIMS (if indicated):     Assets:  Financial Resources/Insurance Housing Social Support  ADL's:  Impaired  Cognition:  Impaired,  Mild  Sleep:  Number of Hours: 8.3     Treatment Plan Summary: Daily contact with patient to assess and evaluate symptoms and progress in treatment, Medication management and Plan Patient was started on lithium with the understanding that this will have to be done carefully with dialysis.  She is continuing to get dialysis on her regular schedule.  Medicine continues to follow up for which we are extremely appreciative.  Patient is still not eating well and is confused.  Dr. Mamie Nick mention to me the possibility that we consider ECT which I think is a reasonable consideration.  We would not be able to start on Monday but I can try getting  in touch with her sister and starting to think about it.  Encourage patient to eat.  Continue one-to-one for now because of extreme risk of falls.  Alethia Berthold, MD 09/26/2017, 2:18 PM

## 2017-09-26 NOTE — Plan of Care (Signed)
  Progressing Elimination: Will not experience complications related to bowel motility 09/26/2017 1606 - Progressing by Rise Mu, RN Will not experience complications related to urinary retention 09/26/2017 1606 - Progressing by Rise Mu, RN Pain Managment: General experience of comfort will improve 09/26/2017 1606 - Progressing by Rise Mu, RN Health Behavior/Discharge Planning: Compliance with prescribed medication regimen will improve 09/26/2017 1606 - Progressing by Rise Mu, RN Fluid Volume: Compliance with measures to maintain balanced fluid volume will improve 09/26/2017 1606 - Progressing by Rise Mu, RN Physical Regulation: Complications related to the disease process, condition or treatment will be avoided or minimized 09/26/2017 1606 - Progressing by Rise Mu, RN   Not Progressing Activity: Ability to tolerate increased activity will improve 09/26/2017 1606 - Not Progressing by Rise Mu, RN

## 2017-09-27 ENCOUNTER — Inpatient Hospital Stay: Payer: Medicare HMO

## 2017-09-27 NOTE — Progress Notes (Signed)
[facility] MD Progress Note  09/27/2017 2:18 PM Joyce [name]  MRN:  [id number] Subjective: Follow-up for 72 year old woman with bipolar disorder recently psychotic also with multiple medical problems including hemodialysis.  Patient's mental status seems a little different today.  She was not agitated and when I ask her about her delusions from yesterday she did not spontaneously discuss them.  Instead she seems more withdrawn and nervous today.  Patient was complaining of pain in her chest but it was very clearly pain in her sternum which was tender to the touch.  Patient has complained that she has difficulty getting up because of the pain.  Nursing noticed a large area of swollen vein near her dialysis shunt.  I spoke with nephrologist Dr. Candiss Norse who tells me that is a chronic finding that needs no specific change in treatment today.  Patient is not showing obvious symptoms of any lithium toxicity or Haldol side effects. Principal Problem: Bipolar affective disorder, current episode manic with psychotic symptoms ([place]) Diagnosis:   Patient Active Problem List   Diagnosis Date Noted  . Bipolar affective disorder, current episode manic with psychotic symptoms ([place]) [F31.2] 09/17/2017  . Bipolar I disorder, most recent episode (or current) manic ([place]) [F31.10] 09/02/2017  . Acute delirium [R41.0] 09/02/2017  . Altered mental status [R41.82]   . Acute encephalopathy [G93.40] 09/01/2017  . ESRD on dialysis ([place]) [N18.6, Z99.2] 04/06/2017  . Complication of vascular access for dialysis [T82.9XXA] 04/06/2017  . Hematuria [R31.9] 12/18/2016  . Polycystic kidney [Q61.3] 10/12/2016  . Ruptured cyst of kidney [Q61.00] 10/03/2016  . HTN (hypertension) [I10] 10/01/2016  . Chronic kidney disease [N18.9] 10/01/2016  . Anemia associated with chronic renal failure [D63.1] 09/24/2016  . Acute kidney insufficiency [N28.9] 07/28/2016  . Noninfectious diarrhea [K52.9]   . Benign neoplasm of cecum [D12.0]   .  Benign neoplasm of ascending colon [D12.2]   . Diarrhea [R19.7]   . Nausea [R11.0]   . Gastric polyp [K31.7]   . Chronic constipation [K59.09] 12/27/2014  . Nausea with vomiting [R11.2] 12/27/2014  . Left sided abdominal pain [R10.9] 12/27/2014  . Personal history of colonic polyps [Z86.010] 05/10/2013   Total Time spent with patient: 30 minutes  Past Psychiatric History: Patient has a history of bipolar disorder with recent decompensation into several episodes of psychosis  Past Medical History:  Past Medical History:  Diagnosis Date  . Anal fissure   . Bipolar affective disorder ([place])   . CKD (chronic kidney disease)    Dr Holley Raring [name] 4  . Colon polyps   . Diverticulitis   . Diverticulitis   . Family history of adverse reaction to anesthesia    mom - PONV  . GERD (gastroesophageal reflux disease)   . Headache    migraines - none over 10 yrs  . Heart murmur   . History of hiatal hernia   . Hypertension   . Pancreatitis    Valproic acid  . Vertigo     Past Surgical History:  Procedure Laterality Date  . A/V FISTULAGRAM Left 04/14/2017   Procedure: A/V Fistulagram;  Surgeon: Katha Cabal, MD;  Location: [place] [facility];  Service: Cardiovascular;  Laterality: Left;  . A/V FISTULAGRAM Left 06/09/2017   Procedure: A/V FISTULAGRAM;  Surgeon: Katha Cabal, MD;  Location: [place] [facility];  Service: Cardiovascular;  Laterality: Left;  . ABDOMINAL HYSTERECTOMY  1990 ?  . AV FISTULA PLACEMENT  4/30  . BREAST EXCISIONAL BIOPSY Left 1994   neg  surgical bx  . CHOLECYSTECTOMY  2003  . COLONOSCOPY  2014   Dr. Jamal Collin  . COLONOSCOPY WITH PROPOFOL N/A 09/24/2015   Procedure: COLONOSCOPY WITH random colon byopies.;  Surgeon: Lucilla Lame, MD;  Location: [place];  Service: Endoscopy;  Laterality: N/A;  . ESOPHAGOGASTRODUODENOSCOPY (EGD) WITH PROPOFOL N/A 09/24/2015   Procedure: ESOPHAGOGASTRODUODENOSCOPY (EGD) ;  Surgeon: Lucilla Lame, MD;  Location: [place];  Service: Endoscopy;  Laterality: N/A;  . EYE SURGERY    . PERIPHERAL VASCULAR CATHETERIZATION N/A 05/29/2015   Procedure: A/V Shuntogram/Fistulagram;  Surgeon: Katha Cabal, MD;  Location: [place] [facility];  Service: Cardiovascular;  Laterality: N/A;  . PERIPHERAL VASCULAR CATHETERIZATION N/A 05/29/2015   Procedure: A/V Shunt Intervention;  Surgeon: Katha Cabal, MD;  Location: [place] [facility];  Service: Cardiovascular;  Laterality: N/A;  . POLYPECTOMY  09/24/2015   Procedure: POLYPECTOMY INTESTINAL;  Surgeon: Lucilla Lame, MD;  Location: [place];  Service: Endoscopy;;  cecal polyp ascending polyp   Family History:  Family History  Problem Relation Age of Onset  . Stroke Father   . Hypertension Father   . Breast cancer Other   . Colon cancer Neg Hx   . Liver disease Neg Hx    Family Psychiatric  History: Positive bipolar Social History:  Social History   Substance and Sexual Activity  Alcohol Use No  . Alcohol/week: 0.0 oz     Social History   Substance and Sexual Activity  Drug Use No    Social History   Socioeconomic History  . Marital status: Single    Spouse name: None  . Number of children: 1  . Years of education: None  . Highest education level: None  Social Needs  . Financial resource strain: None  . Food insecurity - worry: None  . Food insecurity - inability: None  . Transportation needs - medical: None  . Transportation needs - non-medical: None  Occupational History  . Occupation: Surveyor, quantity: [employer]  Tobacco Use  . Smoking status: Never Smoker  . Smokeless tobacco: Never Used  Substance and Sexual Activity  . Alcohol use: No    Alcohol/week: 0.0 oz  . Drug use: No  . Sexual activity: Not Currently  Other Topics Concern  . 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   Additional Social History:    History of  alcohol / drug use?: No history of alcohol / drug abuse                    Sleep: Fair  Appetite:  Poor  Current Medications: Current Facility-Administered Medications  Medication Dose Route Frequency Provider Last Rate Last Dose  . acetaminophen (TYLENOL) tablet 325 mg  325 mg Oral BID PRN Moani Weipert, [name], MD   325 mg at 09/27/17 1225  . alum & mag hydroxide-simeth (MAALOX/MYLANTA) 200-200-20 MG/5ML suspension 30 mL  30 mL Oral Q4H PRN Jaysen Wey T, MD      . bisacodyl (DULCOLAX) EC tablet 5 mg  5 mg Oral Daily PRN Vikram Tillett T, MD      . brimonidine (ALPHAGAN) 0.2 % ophthalmic solution 1 drop  1 drop Both Eyes BID Bentli Llorente, [name], MD   1 drop at 09/27/17 [phone]  . carbamazepine (TEGRETOL) tablet 200 mg  200 mg Oral BID Asianae Minkler, [name], MD   200 mg  at 09/27/17 0828  . cholecalciferol (VITAMIN D) tablet 1,000 Units  1,000 Units Oral Daily Copelyn Widmer, [name], MD   1,000 Units at 09/27/17 [phone]  . [START ON 10/17/2017] cyanocobalamin ((VITAMIN B-12)) injection 1,000 mcg  1,000 mcg Intramuscular Q30 days Refael Fulop T, MD      . dicyclomine (BENTYL) tablet 20 mg  20 mg Oral TID AC Shayaan Parke T, MD   20 mg at 09/27/17 1225  . feeding supplement (ENSURE ENLIVE) (ENSURE ENLIVE) liquid 237 mL  237 mL Oral BID BM [name], [name], MD   237 mL at 09/26/17 1705  . haloperidol (HALDOL) tablet 1 mg  1 mg Oral TID [name], [name], MD   1 mg at 09/27/17 1225  . lidocaine-prilocaine (EMLA) cream 1 application  1 application Topical Q M,W,F Emya Picado T, MD      . lithium citrate 300 MG/5ML solution 300 mg  300 mg Oral BID AC & HS [name], [name], MD   300 mg at 09/27/17 0827  . magnesium hydroxide (MILK OF MAGNESIA) suspension 30 mL  30 mL Oral Daily PRN Ragen Laver T, MD      . midodrine (PROAMATINE) tablet 10 mg  10 mg Oral TID WC [name], MD   10 mg at 09/27/17 1225  . mirtazapine (REMERON) tablet 15 mg  15 mg Oral QHS [name], [name], MD   15 mg at  09/26/17 2102  . multivitamin (RENA-VIT) tablet 1 tablet  1 tablet Oral QHS [name], [name], MD   1 tablet at 09/26/17 2102  . pantoprazole (PROTONIX) EC tablet 40 mg  40 mg Oral Daily Kiosha Buchan, [name], MD   40 mg at 09/27/17 6294  . timolol (TIMOPTIC) 0.5 % ophthalmic solution 1 drop  1 drop Both Eyes BID Pershing Skidmore, [name], MD   1 drop at 09/27/17 7654    Lab Results: No results found for this or any previous visit (from the past 48 hour(s)).  Blood Alcohol level:  Lab Results  Component Value Date   ETH <10 65/09/5463    Metabolic Disorder Labs: Lab Results  Component Value Date   HGBA1C 4.8 09/18/2017   MPG 91.06 09/18/2017   No results found for: PROLACTIN Lab Results  Component Value Date   CHOL 251 (H) 09/18/2017   TRIG 124 09/18/2017   HDL 66 09/18/2017   CHOLHDL 3.8 09/18/2017   VLDL 25 09/18/2017   LDLCALC 160 (H) 09/18/2017   LDLCALC 43 05/14/2014    Physical Findings: AIMS: Facial and Oral Movements Muscles of Facial Expression: None, normal Lips and Perioral Area: None, normal Jaw: None, normal Tongue: None, normal,Extremity Movements Upper (arms, wrists, hands, fingers): None, normal Lower (legs, knees, ankles, toes): None, normal, Trunk Movements Neck, shoulders, hips: None, normal, Overall Severity Severity of abnormal movements (highest score from questions above): None, normal Incapacitation due to abnormal movements: None, normal Patient's awareness of abnormal movements (rate only patient's report): No Awareness, Dental Status Current problems with teeth and/or dentures?: No Does patient usually wear dentures?: No  CIWA:    COWS:     Musculoskeletal: Strength & Muscle Tone: decreased Gait & Station: unsteady Patient leans: N/A  Psychiatric Specialty Exam: Physical Exam  Nursing note and vitals reviewed. Constitutional: She appears well-developed and well-nourished.  HENT:  Head: Normocephalic and atraumatic.  Eyes: Conjunctivae are  normal. Pupils are equal, round, and reactive to light.  Neck: Normal range of motion.  Cardiovascular: Regular rhythm and normal heart sounds.  Respiratory: Effort normal.  GI: Soft.  Musculoskeletal: Normal range of motion.  Neurological: She is alert.  Skin: Skin is warm and dry.     Psychiatric: Her affect is blunt. Her speech is delayed. She is slowed. Thought content is paranoid. Cognition and memory are impaired. She expresses impulsivity. She expresses no homicidal and no suicidal ideation.    Review of Systems  Constitutional: Negative.   HENT: Negative.   Eyes: Negative.   Respiratory: Negative.   Cardiovascular: Negative.   Gastrointestinal: Negative.   Musculoskeletal: Negative.        Patient complained of chest pain but it is clearly sternal pain that gets worse with even slight pressure on her sternum.  Skin: Negative.   Neurological: Negative.   Psychiatric/Behavioral: Negative for depression, hallucinations, memory loss, substance abuse and suicidal ideas. The patient is nervous/anxious. The patient does not have insomnia.     Blood pressure (!) 107/56, pulse (!) 57, temperature 99 F (37.2 C), temperature source Oral, resp. rate 17, height 5\' 3"  (1.6 m), weight 72.6 kg (160 lb), SpO2 100 %.Body mass index is 28.34 kg/m.  General Appearance: Casual  Eye Contact:  Fair  Speech:  Slow  Volume:  Decreased  Mood:  Dysphoric  Affect:  Constricted  Thought Process:  Disorganized  Orientation:  Negative  Thought Content:  Illogical  Suicidal Thoughts:  No  Homicidal Thoughts:  No  Memory:  Immediate;   Fair Recent;   Poor Remote;   Fair  Judgement:  Impaired  Insight:  Shallow  Psychomotor Activity:  Decreased  Concentration:  Concentration: Poor  Recall:  [employer] of Knowledge:  Fair  Language:  Fair  Akathisia:  No  Handed:  Right  AIMS (if indicated):     Assets:  Desire for Improvement Housing Resilience  ADL's:  Impaired  Cognition:  Impaired,   Mild  Sleep:  Number of Hours: 6.45     Treatment Plan Summary: Daily contact with patient to assess and evaluate symptoms and progress in treatment, Medication management and Plan Patient also continues to have persistently low blood pressure and be a little bradycardic.  She is not eating or drinking well.  Gets dizzy with standing up.  Patient was strongly encouraged to please be eating more.  She has dialysis tomorrow and will presumably be checked by nephrology as well at that point.  I am going to go ahead and get an x-ray of her sternum if possible today to see if there is a fracture that may be causing this persistent pain.  No other change to treatment plan for today.  We did order an EKG today which I reviewed and is normal.  Alethia Berthold, MD 09/27/2017, 2:18 PM

## 2017-09-27 NOTE — Plan of Care (Signed)
Pt. Pain improved this evening. Pt. Compliant with medications this evening. Pt. Is able to verbalize she can remain safe while on the unit. Pt. Sleeping good per sleep reports. Pt. Continues this evening to present with psychotic features and confusion. Pt. Unable to verbalize feelings well this evening. Pt. Minimally engages with this Probation officer. Pt. Eating poor. No suicidal and/or homicidal ideations. Denies AVH. Pt. Disorganized frequently during conversations with this Probation officer. Education provided needs reinforcement do to confusion and psychosis.     Not Progressing Education: Will be free of psychotic symptoms 09/27/2017 2248 - Not Progressing by Reyes Ivan, RN Knowledge of the prescribed therapeutic regimen will improve 09/27/2017 2248 - Not Progressing by Reyes Ivan, RN Coping: Ability to cope will improve 09/27/2017 2248 - Not Progressing by Reyes Ivan, RN Ability to verbalize feelings will improve 09/27/2017 2248 - Not Progressing by Reyes Ivan, RN Nutritional: Ability to achieve adequate nutritional intake will improve 09/27/2017 2248 - Not Progressing by Reyes Ivan, RN Self-Concept: Ability to verbalize positive feelings about self will improve 09/27/2017 2248 - Not Progressing by Reyes Ivan, RN Activity: Ability to tolerate increased activity will improve 09/27/2017 2248 - Not Progressing by Reyes Ivan, RN   Progressing Pain Managment: General experience of comfort will improve 09/27/2017 2248 - Progressing by Reyes Ivan, RN Health Behavior/Discharge Planning: Compliance with prescribed medication regimen will improve 09/27/2017 2248 - Progressing by Reyes Ivan, RN Safety: Ability to remain free from injury will improve 09/27/2017 2248 - Progressing by Reyes Ivan, RN Activity: Sleeping patterns will improve 09/27/2017 2248 - Progressing by Reyes Ivan, RN

## 2017-09-27 NOTE — Plan of Care (Signed)
  Progressing Elimination: Will not experience complications related to bowel motility 09/27/2017 1841 - Progressing by Rise Mu, RN 09/27/2017 1747 - Progressing by Rise Mu, RN Will not experience complications related to urinary retention 09/27/2017 1841 - Progressing by Rise Mu, RN 09/27/2017 1747 - Progressing by Rise Mu, RN Coping: Ability to cope will improve 09/27/2017 1841 - Progressing by Rise Mu, RN Ability to verbalize feelings will improve 09/27/2017 1841 - Progressing by Rise Mu, RN Health Behavior/Discharge Planning: Compliance with prescribed medication regimen will improve 09/27/2017 1841 - Progressing by Rise Mu, RN Note Medication compliant Safety: Ability to redirect hostility and anger into socially appropriate behaviors will improve 09/27/2017 1841 - Progressing by Rise Mu, RN Note No hostility or anger outbursts.  Ability to remain free from injury will improve 09/27/2017 1841 - Progressing by Rise Mu, RN Note Remains safe on unit. 1:1 maintained for safety   Not Progressing Pain Managment: General experience of comfort will improve 09/27/2017 1841 - Not Progressing by Rise Mu, RN Note Discomfort due to pain in chest.  Medicated x1 with marginal results.   09/27/2017 1747 - Not Progressing by Rise Mu, RN Education: Will be free of psychotic symptoms 09/27/2017 1841 - Not Progressing by Rise Mu, RN Note Intermittent confusion noted.   Nutritional: Ability to achieve adequate nutritional intake will improve 09/27/2017 1841 - Not Progressing by Rise Mu, RN Note Appetite poor.  Refuses ensure verbalizes that she does not like them Activity: Ability to tolerate increased activity will improve 09/27/2017 1841 - Not Progressing by Rise Mu, RN Note Minimal activity.  Has to be encouraged to get out of bed. Verbalizes that does not feel like getting up

## 2017-09-27 NOTE — Progress Notes (Signed)
D:Pt denies SI/HI/AVH. Pt. Is able to verbalize she can remain safe while on the unit. Pt. Upon interaction is still very minimally engaging and present with: confusion, less tangentialness, disorganized thought process, and lacks insight often. Pt. appears disheveled still. Pt. Taking her medications fine, but overall more anxious this evening. Pt. Pain is manageable this evening. Pt. Sleeping good per sleep reports. Pt. Eating very poor, as reported and as well during hand-off report. Pt. Very isolative and withdrawn. Pt. Dialysis fistula WNL.    A: Q x 15 minute observation checks were completed for safety. Patient was provided with education. Pt. Needs reinforcement on provided education. Continues to  show little evidence of retaining education taught. Patient was given scheduled medications. Patient was encourage to attend groups, participate in unit activities and continue with plan of care. Pt. Given frequent information/education/reminders to utilize incentive spirometer every hour. Pt. Demonstrates capability to utilize incentive spirometer appropriately after reminders and occasional help. Pt. Given education to use wheelchair and call for assistance, for safety, again. Will continue to remind/re-educate to utilize wheelchair for safety.   R:Patient is complaint with medication and unit procedures. Pt. Does not attend groups. Pt. Isolative.             Precautionary checks every 15 minutes for safety maintained, room free of safety hazards, patient sustains no injury or falls during this shift.   1:1 Maintained per MD orders for safety  2000: Pt. Asleep at this time  2100: Pt. Resting at this time 2200:Pt. Asleep at this time 2300:  Pt. Asleep at this time 0000: Pt. Resting at this time 0100:  Pt. Resting at this time 0200:Pt. Observed sleeping at this time. 0300:Pt. Observed sleeping at this time. 0400:Pt. Observed sleeping at this time. 0500:Pt. Observed sleeping at  this time.

## 2017-09-27 NOTE — Progress Notes (Signed)
Hourly rounding 0800 Sitting on side of bed eating breakfast.  1:1 sitter for safety 0900 Resting in bed with eyes closed.  1:1 sitter maintained 1000 Resting in bed with eyes closed.  1:1 sitter maintained 1100 Resting in bed with eyes closed.  1:1 sitter maintained 1200 Resting in bed with eyes closed.  1:1 sitter maintained 1300 Resting in bed with eyes closed.  1:1 sitter maintained  1400 Resting in bed with eyes closed.  1:1 sitter maintained 1500 Resting in bed with eyes closed.  1:1 sitter maintained 1600 Resting in bed with eyes closed.  1:1 sitter maintained 1700 In bed, restless, tossing and turning.  1:1 sitter for safety 1800 Resting in bed with eyes closed.  1:1 maintained 1845 Resting in bed with eyes closed.  1:1 maintained.

## 2017-09-27 NOTE — [facility] Group Notes (Signed)
LCSW Group Therapy Note 09/27/2017 1:15pm  Type of Therapy and Topic: Group Therapy: Feelings Around Returning Home & Establishing a Supportive Framework and Supporting Oneself When Supports Not Available  Participation Level: Did Not Attend  Description of Group:  Patients first processed thoughts and feelings about upcoming discharge. These included fears of upcoming changes, lack of change, new living environments, judgements and expectations from others and overall stigma of mental health issues. The group then discussed the definition of a supportive framework, what that looks and feels like, and how do to discern it from an unhealthy non-supportive network. The group identified different types of supports as well as what to do when your family/friends are less than helpful or unavailable  Therapeutic Goals  1. Patient will identify one healthy supportive network that they can use at discharge. 2. Patient will identify one factor of a supportive framework and how to tell it from an unhealthy network. 3. Patient able to identify one coping skill to use when they do not have positive supports from others. 4. Patient will demonstrate ability to communicate their needs through discussion and/or role plays.  Summary of Patient Progress:     Therapeutic Modalities Cognitive Behavioral Therapy Motivational Interviewing   Cheree Ditto, LCSW 09/27/2017 12:33 PM

## 2017-09-27 NOTE — Progress Notes (Signed)
1:1 Maintained per MD orders for safety  0700: Pt. Observed sleeping at this time.  Pt. Unable to be compliant with orthostatic vital signs this morning. Will update nursing during morning hand-off report.

## 2017-09-27 NOTE — Progress Notes (Signed)
Patient c/o chest.  States that feels like something sitting in the middle of chest.  Denies any SOB.  Respirations appear normal and non labored.  BP and pulse taken(see flow sheet).  Dr. Weber Cooks informed.  Orders received.

## 2017-09-28 ENCOUNTER — Encounter: Payer: Self-pay | Admitting: *Deleted

## 2017-09-28 ENCOUNTER — Other Ambulatory Visit: Payer: Self-pay | Admitting: *Deleted

## 2017-09-28 LAB — RENAL FUNCTION PANEL
Albumin: 3.9 g/dL (ref 3.5–5.0)
Anion gap: 15 (ref 5–15)
BUN: 54 mg/dL — ABNORMAL HIGH (ref 6–20)
CO2: 27 mmol/L (ref 22–32)
Calcium: 9.6 mg/dL (ref 8.9–10.3)
Chloride: 96 mmol/L — ABNORMAL LOW (ref 101–111)
Creatinine, Ser: 7.19 mg/dL — ABNORMAL HIGH (ref 0.44–1.00)
GFR calc Af Amer: 6 mL/min — ABNORMAL LOW
GFR calc non Af Amer: 5 mL/min — ABNORMAL LOW
Glucose, Bld: 108 mg/dL — ABNORMAL HIGH (ref 65–99)
Phosphorus: 3.1 mg/dL (ref 2.5–4.6)
Potassium: 4 mmol/L (ref 3.5–5.1)
Sodium: 138 mmol/L (ref 135–145)

## 2017-09-28 LAB — CBC
HCT: 36.2 % (ref 35.0–47.0)
Hemoglobin: 11.9 g/dL — ABNORMAL LOW (ref 12.0–16.0)
MCH: 31.6 pg (ref 26.0–34.0)
MCHC: 32.9 g/dL (ref 32.0–36.0)
MCV: 95.8 fL (ref 80.0–100.0)
Platelets: 274 K/uL (ref 150–440)
RBC: 3.77 MIL/uL — ABNORMAL LOW (ref 3.80–5.20)
RDW: 14.3 % (ref 11.5–14.5)
WBC: 8.6 K/uL (ref 3.6–11.0)

## 2017-09-28 MED ORDER — TEMAZEPAM 15 MG PO CAPS
15.0000 mg | ORAL_CAPSULE | Freq: Every evening | ORAL | Status: DC | PRN
  Administered 2017-09-28 – 2017-09-29 (×2): 15 mg via ORAL
  Filled 2017-09-28 (×2): qty 1

## 2017-09-28 MED ORDER — TEMAZEPAM 15 MG PO CAPS
15.0000 mg | ORAL_CAPSULE | Freq: Once | ORAL | Status: AC
  Administered 2017-09-28: 15 mg via ORAL
  Filled 2017-09-28: qty 1

## 2017-09-28 MED ORDER — OLANZAPINE 5 MG PO TBDP
10.0000 mg | ORAL_TABLET | Freq: Every day | ORAL | Status: DC
  Administered 2017-09-28: 10 mg via ORAL
  Filled 2017-09-28: qty 2

## 2017-09-28 NOTE — Progress Notes (Signed)
1:1 Note  Patient is in her room with her assigned MHT.

## 2017-09-28 NOTE — [facility] Group Notes (Signed)
[facility] Group Notes:  (Nursing/MHT/Case Management/Adjunct)  Date:  09/28/2017  Time:  3:10 PM  Type of Therapy:  Psychoeducational Skills  Participation Level:  Did Not Attend    Drake Leach 09/28/2017, 3:10 PM

## 2017-09-28 NOTE — Progress Notes (Signed)
[facility]  ROUNDING NOTE   Subjective:   Patient is feeling depressed. Slow to answer questions. Refusing dialysis treatment today.   Objective:  Vital signs in last 24 hours:  Temp:  [99.1 F (37.3 C)] 99.1 F (37.3 C) (03/11 0500) Pulse Rate:  [75] 75 (03/11 0500) Resp:  [17] 17 (03/11 0500) BP: (136)/(50) 136/50 (03/11 0500) SpO2:  [96 %] 96 % (03/11 0500)  Weight change:  Filed Weights    Intake/Output: I/O last 3 completed shifts: In: 240 [P.O.:240] Out: -    Intake/Output this shift:  No intake/output data recorded.  Physical Exam: General: NAD, laying in bed  Head: Normocephalic, atraumatic. Moist oral mucosal membranes  Eyes: Anicteric  Neck: Supple, trachea midline  Lungs:  Clear to auscultation  Heart: Regular rate and rhythm  Abdomen:  Soft, nontender  Extremities: no peripheral edema.  Neurologic: Nonfocal, moving all four extremities  Skin: No lesions  Access: Left AVF    Basic Metabolic Panel: Recent Labs  Lab 09/22/17 0014 09/23/17 1108 09/25/17 1043 09/28/17 1335  NA 140  --  138 138  K 3.1*  --  3.0* 4.0  CL 95*  --  96* 96*  CO2 31  --  26 27  GLUCOSE 111*  --  170* 108*  BUN 16  --  40* 54*  CREATININE 2.97*  --  6.61* 7.19*  CALCIUM 9.6  --  9.3 9.6  PHOS 2.6 3.4  --  3.1    Liver Function Tests: Recent Labs  Lab 09/22/17 0014 09/28/17 1335  ALBUMIN 4.4 3.9   No results for input(s): LIPASE, AMYLASE in the last 168 hours. Recent Labs  Lab 09/23/17 0702  AMMONIA 13    CBC: Recent Labs  Lab 09/22/17 0014 09/28/17 1335  WBC 7.7 8.6  HGB 12.8 11.9*  HCT 38.7 36.2  MCV 94.7 95.8  PLT 252 274    Cardiac Enzymes: Recent Labs  Lab 09/23/17 0045 09/23/17 0702 09/23/17 1108  TROPONINI 0.04* 0.05* 0.05*    BNP: Invalid input(s): POCBNP  CBG: Recent Labs  Lab 09/22/17 1914  GLUCAP 128*    Microbiology: Results for orders placed or performed during the hospital encounter of 09/01/17   Urine culture     Status: Abnormal   Collection Time: 09/01/17  4:32 PM  Result Value Ref Range Status   Specimen Description   Final    URINE, RANDOM Performed at [facility], [address] [place], [place] [address]    Special Requests   Final    NONE Performed at [facility], [address] [place], [place] [address]    Culture MULTIPLE SPECIES PRESENT, SUGGEST RECOLLECTION (A)  Final   Report Status 09/03/2017 FINAL  Final  Culture, blood (routine x 2)     Status: None   Collection Time: 09/01/17  6:05 PM  Result Value Ref Range Status   Specimen Description BLOOD RIGHT HAND  Final   Special Requests   Final    BOTTLES DRAWN AEROBIC AND ANAEROBIC Blood Culture results may not be optimal due to an excessive volume of blood received in culture bottles   Culture   Final    NO GROWTH 5 DAYS Performed at [facility], [address], [place], [place] [address]    Report Status 09/06/2017 FINAL  Final  Culture, blood (routine x 2)     Status: None   Collection Time: 09/01/17  6:34 PM  Result Value Ref Range Status  Specimen Description BLOOD RAC  Final   Special Requests   Final    BOTTLES DRAWN AEROBIC AND ANAEROBIC Blood Culture adequate volume   Culture   Final    NO GROWTH 5 DAYS Performed at [facility], [address] [place], [place] [address]    Report Status 09/06/2017 FINAL  Final    Coagulation Studies: No results for input(s): LABPROT, INR in the last 72 hours.  Urinalysis: No results for input(s): COLORURINE, LABSPEC, PHURINE, GLUCOSEU, HGBUR, BILIRUBINUR, KETONESUR, PROTEINUR, UROBILINOGEN, NITRITE, LEUKOCYTESUR in the last 72 hours.  Invalid input(s): APPERANCEUR    Imaging: Dg Ribs Bilateral W/chest  Result Date: 09/27/2017 CLINICAL DATA:  Midsternal chest pain without trauma. EXAM: BILATERAL RIBS AND CHEST - 4+ VIEW COMPARISON:  Chest radiograph 09/24/2017 FINDINGS: Frontal view of the chest and a total  of 6 views of bilateral ribs. Frontal view of the chest demonstrates midline trachea. Borderline cardiomegaly. Mediastinal contours otherwise within normal limits. No pleural effusion or pneumothorax. Left axillary vascular stent. No pleural effusion or pneumothorax. Low lung volumes with mild bibasilar atelectasis. Cholecystectomy clips. Rib films demonstrate no displaced rib fracture or focal osseous lesion. IMPRESSION: No acute findings. Electronically Signed   By: Abigail Miyamoto M.D.   On: 09/27/2017 15:18     Medications:    . brimonidine  1 drop Both Eyes BID  . carbamazepine  200 mg Oral BID  . cholecalciferol  1,000 Units Oral Daily  . [START ON 10/17/2017] cyanocobalamin  1,000 mcg Intramuscular Q30 days  . dicyclomine  20 mg Oral TID AC  . feeding supplement (ENSURE ENLIVE)  237 mL Oral BID BM  . lidocaine-prilocaine  1 application Topical Q M,W,F  . midodrine  10 mg Oral TID WC  . multivitamin  1 tablet Oral QHS  . OLANZapine zydis  10 mg Oral QHS  . pantoprazole  40 mg Oral Daily  . timolol  1 drop Both Eyes BID   acetaminophen, alum & mag hydroxide-simeth, bisacodyl, magnesium hydroxide, temazepam  Assessment/ Plan:  Joyce [name] is a 72 y.o. white female with end stage renal disease on hemodialysis, diverticulosis, hypotension, bipolar disorder, polycystic kidney disease   [facility]/MWF  1.  ESRD on HD MWF. Refusing dialysis today. Most likely secondary to her depressive symptoms.  - Plan on hemodialysis treatment tomorrow if patient is willing.   2.  Anemia of CKD: hemoglobin 11.9 - Holding EPO  3.  Secondary hyperparathyroidism: Phosphorus and calcium at goal. PTH 150.  Not currently on binders.   4. Hypotension:  continue midodrine prior to dialysis.    LOS: 11 Joyce [name] 3/11/20192:25 PM

## 2017-09-28 NOTE — Progress Notes (Signed)
1:1 Maintained per MD orders for safety  2000: Pt. Asleep at this time  2100: Pt. Resting at this time 2200:Pt. Asleep at this time 2300:  Pt. Asleep at this time 0000: Pt. Resting at this time 0100:  Pt. Resting at this time 0200:Pt. Observed sleeping at this time. 0300:Pt. Observed sleeping at this time. 0400:Pt. Observed sleeping at this time. 0500:Pt. Observed sleeping at this time. 0600: Pt. Observed sleeping at this time. 0700: Pt. Observed sleeping at this time.

## 2017-09-28 NOTE — Progress Notes (Signed)
1:1 Note  Patient is in her room awake with her assigned MHT.

## 2017-09-28 NOTE — Progress Notes (Signed)
Patient refused to go to dialysis this morning. Patient's morning medication was moved to this afternoon until after dialysis treatment, but patient refused to take medications from this writer.

## 2017-09-28 NOTE — Progress Notes (Signed)
1:1 Note  Patient is in the dayroom with her assigned MHT.

## 2017-09-28 NOTE — Progress Notes (Signed)
1:1 Note  Patient is asleep in her room with assigned MHT present.

## 2017-09-28 NOTE — Progress Notes (Signed)
1:1 Note  Patient is in her room awake with assigned MHT.

## 2017-09-28 NOTE — Progress Notes (Signed)
1:1 Note   Patient is in her room asleep with her assigned MHT.

## 2017-09-28 NOTE — Progress Notes (Signed)
1:1 Note  Patient is in her room asleep with her assigned MHT.

## 2017-09-28 NOTE — Tx Team (Signed)
Interdisciplinary Treatment and Diagnostic Plan Update  09/28/2017 Time of Session: 10:30am Joyce [name] MRN: [id number]  Principal Diagnosis: Bipolar affective disorder, current episode manic with psychotic symptoms ([place])  Secondary Diagnoses: Principal Problem:   Bipolar affective disorder, current episode manic with psychotic symptoms ([name]) Active Problems:   ESRD on dialysis [name])   Current Medications:  Current Facility-Administered Medications  Medication Dose Route Frequency Provider Last Rate Last Dose  . acetaminophen (TYLENOL) tablet 325 mg  325 mg Oral BID PRN Clapacs, [name], MD   325 mg at 09/27/17 1225  . alum & mag hydroxide-simeth (MAALOX/MYLANTA) 200-200-20 MG/5ML suspension 30 mL  30 mL Oral Q4H PRN Clapacs, [name], MD      . bisacodyl (DULCOLAX) EC tablet 5 mg  5 mg Oral Daily PRN Clapacs, [name], MD      . brimonidine (ALPHAGAN) 0.2 % ophthalmic solution 1 drop  1 drop Both Eyes BID Clapacs, [name], MD   1 drop at 09/27/17 1712  . carbamazepine (TEGRETOL) tablet 200 mg  200 mg Oral BID Clapacs, [name], MD   200 mg at 09/27/17 1712  . cholecalciferol (VITAMIN D) tablet 1,000 Units  1,000 Units Oral Daily Clapacs, [name], MD   1,000 Units at 09/27/17 [phone]  . [START ON 10/17/2017] cyanocobalamin ((VITAMIN B-12)) injection 1,000 mcg  1,000 mcg Intramuscular Q30 days Clapacs, [name], MD      . dicyclomine (BENTYL) tablet 20 mg  20 mg Oral TID AC Clapacs, [name], MD   20 mg at 09/27/17 1712  . feeding supplement (ENSURE ENLIVE) (ENSURE ENLIVE) liquid 237 mL  237 mL Oral BID BM [name], [name], MD   237 mL at 09/27/17 1711  . haloperidol (HALDOL) tablet 1 mg  1 mg Oral TID [name], [name], MD   1 mg at 09/27/17 1712  . lidocaine-prilocaine (EMLA) cream 1 application  1 application Topical Q M,W,F Clapacs, [name], MD      . magnesium hydroxide (MILK OF MAGNESIA) suspension 30 mL  30 mL Oral Daily PRN Clapacs, [name], MD      . midodrine (PROAMATINE) tablet 10  mg  10 mg Oral TID WC [name], MD   10 mg at 09/27/17 1712  . mirtazapine (REMERON) tablet 15 mg  15 mg Oral QHS [name], [name], MD   15 mg at 09/27/17 2109  . multivitamin (RENA-VIT) tablet 1 tablet  1 tablet Oral QHS [name], [name], MD   1 tablet at 09/27/17 2109  . pantoprazole (PROTONIX) EC tablet 40 mg  40 mg Oral Daily Clapacs, [name], MD   40 mg at 09/27/17 7017  . timolol (TIMOPTIC) 0.5 % ophthalmic solution 1 drop  1 drop Both Eyes BID Clapacs, [name], MD   1 drop at 09/27/17 1712   PTA Medications: Medications Prior to Admission  Medication Sig Dispense Refill Last Dose  . acetaminophen (TYLENOL) 325 MG tablet Take 325 mg 2 (two) times daily as needed by mouth for moderate pain or headache.    unknown  . ARIPiprazole (ABILIFY) 2 MG tablet Take 2 mg by mouth daily.   unknown  . bisacodyl (DULCOLAX) 5 MG EC tablet Take 1 tablet (5 mg total) by mouth daily as needed for moderate constipation. (Patient not taking: Reported on 04/09/2017) 30 tablet 0 Not Taking at Unknown time  . brimonidine (ALPHAGAN) 0.2 % ophthalmic solution Place 2 (two) times daily into both eyes.   unknown  .  carbamazepine (TEGRETOL) 200 MG tablet Take 200 mg by mouth 2 (two) times daily.   unknown  . cephALEXin (KEFLEX) 500 MG capsule Take 1 capsule (500 mg total) by mouth every 12 (twelve) hours. (Patient not taking: Reported on 09/16/2017) 5 capsule 0 Completed Course at Unknown time  . cholecalciferol (VITAMIN D) 1000 UNITS tablet Take 1,000 Units by mouth daily.   unknown  . cyanocobalamin (,VITAMIN B-12,) 1000 MCG/ML injection Inject 1,000 mcg into the muscle every 30 (thirty) days.   unknown  . dicyclomine (BENTYL) 20 MG tablet Take 1 tablet (20 mg total) by mouth 3 (three) times daily before meals. 30 tablet 0 unknown  . lidocaine-prilocaine (EMLA) cream Apply 1 application every Monday, Wednesday, and Friday topically. At dialysis  3 Taking  . midodrine (PROAMATINE) 10 MG tablet Take 10 mg  by mouth daily.    unknown  . pantoprazole (PROTONIX) 40 MG tablet TAKE 1 TABLET EVERY DAY 90 tablet 3 unknown  . pramipexole (MIRAPEX) 0.125 MG tablet Take 0.125 mg by mouth daily at 2 PM.  3 unknown  . QUEtiapine (SEROQUEL) 25 MG tablet Take 50 mg by mouth at bedtime.    unknown  . timolol (TIMOPTIC) 0.5 % ophthalmic solution Place 1 drop 2 (two) times daily into both eyes.    unknown    Patient Stressors: Health problems  Patient Strengths: Ability for insight Active sense of humor Average or above average intelligence Capable of independent living Occupational psychologist fund of knowledge Supportive family/friends  Treatment Modalities: Medication Management, Group therapy, Case management,  1 to 1 session with clinician, Psychoeducation, Recreational therapy.   Physician Treatment Plan for Primary Diagnosis: Bipolar affective disorder, current episode manic with psychotic symptoms ([name]) Long Term Goal(s): Improvement in symptoms so as ready for discharge NA   Short Term Goals: Ability to identify changes in lifestyle to reduce recurrence of condition will improve Ability to verbalize feelings will improve Ability to disclose and discuss suicidal ideas Ability to demonstrate self-control will improve Ability to identify and develop effective coping behaviors will improve Ability to identify triggers associated with substance abuse/mental health issues will improve NA  Medication Management: Evaluate patient's response, side effects, and tolerance of medication regimen.  Therapeutic Interventions: 1 to 1 sessions, Unit Group sessions and Medication administration.  Evaluation of Outcomes: Progressing  Physician Treatment Plan for Secondary Diagnosis: Principal Problem:   Bipolar affective disorder, current episode manic with psychotic symptoms ([place]) Active Problems:   ESRD on dialysis ([name])  Long Term Goal(s): Improvement in symptoms so as ready  for discharge NA   Short Term Goals: Ability to identify changes in lifestyle to reduce recurrence of condition will improve Ability to verbalize feelings will improve Ability to disclose and discuss suicidal ideas Ability to demonstrate self-control will improve Ability to identify and develop effective coping behaviors will improve Ability to identify triggers associated with substance abuse/mental health issues will improve NA     Medication Management: Evaluate patient's response, side effects, and tolerance of medication regimen.  Therapeutic Interventions: 1 to 1 sessions, Unit Group sessions and Medication administration.  Evaluation of Outcomes: Progressing   RN Treatment Plan for Primary Diagnosis: Bipolar affective disorder, current episode manic with psychotic symptoms ([place]) Long Term Goal(s): Knowledge of disease and therapeutic regimen to maintain health will improve  Short Term Goals: Ability to demonstrate self-control and Compliance with prescribed medications will improve  Medication Management: RN will administer medications as ordered by provider, will assess and evaluate  patient's response and provide education to patient for prescribed medication. RN will report any adverse and/or side effects to prescribing provider.  Therapeutic Interventions: 1 on 1 counseling sessions, Psychoeducation, Medication administration, Evaluate responses to treatment, Monitor vital signs and CBGs as ordered, Perform/monitor CIWA, COWS, AIMS and Fall Risk screenings as ordered, Perform wound care treatments as ordered.  Evaluation of Outcomes: Progressing   LCSW Treatment Plan for Primary Diagnosis: Bipolar affective disorder, current episode manic with psychotic symptoms ([place]) Long Term Goal(s): Safe transition to appropriate next level of care at discharge, Engage patient in therapeutic group addressing interpersonal concerns.  Short Term Goals: Engage patient in aftercare planning  with referrals and resources, Increase social support and Increase skills for wellness and recovery  Therapeutic Interventions: Assess for all discharge needs, 1 to 1 time with Social worker, Explore available resources and support systems, Assess for adequacy in community support network, Educate family and significant other(s) on suicide prevention, Complete Psychosocial Assessment, Interpersonal group therapy.  Evaluation of Outcomes: Progressing   Progress in Treatment: Attending groups: No. Participating in groups: No. Taking medication as prescribed: Yes. Toleration medication: Yes. Family/Significant other contact made: No, will contact:  Patient refused family contact Patient understands diagnosis: Yes. Discussing patient identified problems/goals with staff: Yes. Medical problems stabilized or resolved: Yes. Denies suicidal/homicidal ideation: Yes. Issues/concerns per patient self-inventory: No. Other:   New problem(s) identified: No, Describe:  None  New Short Term/Long Term Goal(s): "To be able to focus and trust a person and not doubt my doctor."  Discharge Plan or Barriers: To return home and follow up with establish provider.   Reason for Continuation of Hospitalization: Medical Issues Medication stabilization  Estimated Length of Stay: 7 days  Attendees: Patient:  09/28/2017 10:55 AM  Physician: Dr. Carollee Herter, MD 09/28/2017 10:55 AM  Nursing:  09/28/2017 10:55 AM  RN Care Manager:  09/28/2017 10:55 AM  Social Worker: Darin Engels, [place] 09/28/2017 10:55 AM  Recreational Therapist:  09/28/2017 10:55 AM  Other: Alden Hipp, LCSW 09/28/2017 10:55 AM  Other:  09/28/2017 10:55 AM  Other: 09/28/2017 10:55 AM    Scribe for Treatment Team: Darin Engels, LCSW 09/28/2017 10:55 AM

## 2017-09-28 NOTE — Patient Outreach (Signed)
[facility]) Care Management [place] Telephone [facility] Coordination  09/28/2017  Joyce [name] [facility] Feb 03, 1946 [id number]  Case Closure entry as coverage for patient's primary [facility] RN [place] messaging via EMR to [place] and Natividad Brood re: Joyce [name], 72 y/o female referred to [facility] on September 11, 2017 by [facility] telephonic RN for transition of care after hospitalization February 12-14,2019 for UTI, altered mental status; patient was re-hospitalized and remains currently admitted.  Patient had not responded to previous [address] outreach attempts, and has been currently hospitalized > 10 days.  Plan:  Will close patient's [facility] CM case and make [facility] CMA, primary [facility] RN CCM and patient's PCP aware of same (will send PCP case closure letter)  Oneta Rack, RN, BSN, [place] Care Management  [phone]

## 2017-09-28 NOTE — Progress Notes (Signed)
1:1 Note  Patient is awake in her room with her assigned MHT.

## 2017-09-28 NOTE — Progress Notes (Signed)
D- Patient alert and oriented. Patient presents in a pleasant mood, but is very confused/disorganized on assessment. Patient reports that she slept good last night stating "I guess I did". Patient keeps stating to this writer "Ineed to talk to the doctor". Patient reports to this Probation officer that she is experiencing auditory and visual hallucinations and when this Probation officer asked patient to elaborate, patient stated "I'm going to talk to the doctor about it. Patient is saying "I need to get out of here, I know I won't be going home, I just want to release them". Patient denies SI, HI, and pain at this time. Patient does not have a goal for today.  A- Scheduled medications administered to patient, per MD orders. Support and encouragement provided.  Routine safety checks conducted every 15 minutes.  Patient informed to notify staff with problems or concerns.  R- No adverse drug reactions noted. Patient contracts for safety at this time. Patient compliant with medications and treatment plan. Patient receptive, calm, and cooperative. Patient interacts well with others on the unit.  Patient remains safe at this time.

## 2017-09-28 NOTE — Plan of Care (Signed)
Patient has not experienced any complications related to bowel motility or urinary retention. Patient's overall comfort level appears to be fine, but patient seems very restless in her bed while awake. Patient denies SI/HI this morning, however she does endorse AVH and when this writer asked her to elaborate, patient states "I'll talk to the doctor about it". Patient has not verbalized understanding of her prescribed therapeutic regimen because she is very confused and is disorganized. Patient has not shown any positive coping skills today because she has refused her dialysis as well as her medications today. Patient has not achieved adequate nutrition today, she has not eaten any of her meals so far and has only taken a few sips of water throughout the day. Patient states that she slept good "I guess I did" last night. Patient has remained free from injury and is safe on the unit at this time.

## 2017-09-28 NOTE — Progress Notes (Signed)
1:1 Note  Patient is in her room attempting to eat dinner with her assigned MHT.

## 2017-09-28 NOTE — Progress Notes (Signed)
1:1 Note  Patient is in her room, in bed, with her assigned MHT.

## 2017-09-28 NOTE — Progress Notes (Addendum)
[facility] MD Progress Note  09/28/2017 12:34 PM Joyce [name]  MRN:  [id number]  Subjective:   Ms. Weng appears very anxious, fidgety and disorganized in her thinking. She has been complaining of chest pain. She seems confused. She knows her name. She knows she is in the hospital about to take dialysis but refuses medications and dialysis today. She has not eaten and drinks very little. I do not believe that the patient has capacity to make decisions about her treatment today. Left messages for her sister Enid Derry to engage her help.    Treatment plan. She has been on Haldol 1 mg TID, Tegretol 200 mg, Remeron 15 mg. We tried to give Lithium over the weekend with no improvement. Spoke with Dr. Weber Cooks about ECT today. I tried to discuss it with the patient and her sister last week.  Social/disposition. She lives independently. It is unclear if she can continue to do so. Follow up with Dr. Thurmond Butts.  Principal Problem: Bipolar affective disorder, current episode manic with psychotic symptoms ([place]) Diagnosis:   Patient Active Problem List   Diagnosis Date Noted  . Bipolar affective disorder, current episode manic with psychotic symptoms ([place]) [F31.2] 09/17/2017    Priority: High  . Bipolar I disorder, most recent episode (or current) manic ([place]) [F31.10] 09/02/2017  . Acute delirium [R41.0] 09/02/2017  . Altered mental status [R41.82]   . Acute encephalopathy [G93.40] 09/01/2017  . ESRD on dialysis ([place]) [N18.6, Z99.2] 04/06/2017  . Complication of vascular access for dialysis [T82.9XXA] 04/06/2017  . Hematuria [R31.9] 12/18/2016  . Polycystic kidney [Q61.3] 10/12/2016  . Ruptured cyst of kidney [Q61.00] 10/03/2016  . HTN (hypertension) [I10] 10/01/2016  . Chronic kidney disease [N18.9] 10/01/2016  . Anemia associated with chronic renal failure [D63.1] 09/24/2016  . Acute kidney insufficiency [N28.9] 07/28/2016  . Noninfectious diarrhea [K52.9]   . Benign neoplasm of cecum [D12.0]   . Benign  neoplasm of ascending colon [D12.2]   . Diarrhea [R19.7]   . Nausea [R11.0]   . Gastric polyp [K31.7]   . Chronic constipation [K59.09] 12/27/2014  . Nausea with vomiting [R11.2] 12/27/2014  . Left sided abdominal pain [R10.9] 12/27/2014  . Personal history of colonic polyps [Z86.010] 05/10/2013   Total Time spent with patient: 30 minutes  Past Psychiatric History: bipolar disorder  Past Medical History:  Past Medical History:  Diagnosis Date  . Anal fissure   . Bipolar affective disorder ([place])   . CKD (chronic kidney disease)    Dr Holley Raring [name] 4  . Colon polyps   . Diverticulitis   . Diverticulitis   . Family history of adverse reaction to anesthesia    mom - PONV  . GERD (gastroesophageal reflux disease)   . Headache    migraines - none over 10 yrs  . Heart murmur   . History of hiatal hernia   . Hypertension   . Pancreatitis    Valproic acid  . Vertigo     Past Surgical History:  Procedure Laterality Date  . A/V FISTULAGRAM Left 04/14/2017   Procedure: A/V Fistulagram;  Surgeon: Katha Cabal, MD;  Location: [place] [facility];  Service: Cardiovascular;  Laterality: Left;  . A/V FISTULAGRAM Left 06/09/2017   Procedure: A/V FISTULAGRAM;  Surgeon: Katha Cabal, MD;  Location: [place] [facility];  Service: Cardiovascular;  Laterality: Left;  . ABDOMINAL HYSTERECTOMY  1990 ?  . AV FISTULA PLACEMENT  4/30  . BREAST EXCISIONAL BIOPSY Left 1994   neg surgical bx  .  CHOLECYSTECTOMY  2003  . COLONOSCOPY  2014   Dr. Jamal Collin  . COLONOSCOPY WITH PROPOFOL N/A 09/24/2015   Procedure: COLONOSCOPY WITH random colon byopies.;  Surgeon: Lucilla Lame, MD;  Location: [place];  Service: Endoscopy;  Laterality: N/A;  . ESOPHAGOGASTRODUODENOSCOPY (EGD) WITH PROPOFOL N/A 09/24/2015   Procedure: ESOPHAGOGASTRODUODENOSCOPY (EGD) ;  Surgeon: Lucilla Lame, MD;  Location: [place];  Service: Endoscopy;  Laterality: N/A;  . EYE SURGERY    . PERIPHERAL  VASCULAR CATHETERIZATION N/A 05/29/2015   Procedure: A/V Shuntogram/Fistulagram;  Surgeon: Katha Cabal, MD;  Location: [place] [facility];  Service: Cardiovascular;  Laterality: N/A;  . PERIPHERAL VASCULAR CATHETERIZATION N/A 05/29/2015   Procedure: A/V Shunt Intervention;  Surgeon: Katha Cabal, MD;  Location: [place] [facility];  Service: Cardiovascular;  Laterality: N/A;  . POLYPECTOMY  09/24/2015   Procedure: POLYPECTOMY INTESTINAL;  Surgeon: Lucilla Lame, MD;  Location: [place];  Service: Endoscopy;;  cecal polyp ascending polyp   Family History:  Family History  Problem Relation Age of Onset  . Stroke Father   . Hypertension Father   . Breast cancer Other   . Colon cancer Neg Hx   . Liver disease Neg Hx    Family Psychiatric  History: none reported Social History:  Social History   Substance and Sexual Activity  Alcohol Use No  . Alcohol/week: 0.0 oz     Social History   Substance and Sexual Activity  Drug Use No    Social History   Socioeconomic History  . Marital status: Single    Spouse name: None  . Number of children: 1  . Years of education: None  . Highest education level: None  Social Needs  . Financial resource strain: None  . Food insecurity - worry: None  . Food insecurity - inability: None  . Transportation needs - medical: None  . Transportation needs - non-medical: None  Occupational History  . Occupation: Surveyor, quantity: [employer]  Tobacco Use  . Smoking status: Never Smoker  . Smokeless tobacco: Never Used  Substance and Sexual Activity  . Alcohol use: No    Alcohol/week: 0.0 oz  . Drug use: No  . Sexual activity: Not Currently  Other Topics Concern  . 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   Additional Social History:    History of alcohol / drug use?: No history of alcohol / drug abuse                    Sleep:  Fair  Appetite:  Fair  Current Medications: Current Facility-Administered Medications  Medication Dose Route Frequency Provider Last Rate Last Dose  . acetaminophen (TYLENOL) tablet 325 mg  325 mg Oral BID PRN Clapacs, [name], MD   325 mg at 09/27/17 1225  . alum & mag hydroxide-simeth (MAALOX/MYLANTA) 200-200-20 MG/5ML suspension 30 mL  30 mL Oral Q4H PRN Clapacs, [name], MD      . bisacodyl (DULCOLAX) EC tablet 5 mg  5 mg Oral Daily PRN Clapacs, [name], MD      . brimonidine (ALPHAGAN) 0.2 % ophthalmic solution 1 drop  1 drop Both Eyes BID Clapacs, [name], MD   1 drop at 09/27/17 1712  . carbamazepine (TEGRETOL) tablet 200 mg  200 mg Oral BID Clapacs, [name], MD   200 mg at 09/27/17 1712  .  cholecalciferol (VITAMIN D) tablet 1,000 Units  1,000 Units Oral Daily Clapacs, [name], MD   1,000 Units at 09/27/17 [phone]  . [START ON 10/17/2017] cyanocobalamin ((VITAMIN B-12)) injection 1,000 mcg  1,000 mcg Intramuscular Q30 days Clapacs, [name], MD      . dicyclomine (BENTYL) tablet 20 mg  20 mg Oral TID AC Clapacs, [name], MD   20 mg at 09/27/17 1712  . feeding supplement (ENSURE ENLIVE) (ENSURE ENLIVE) liquid 237 mL  237 mL Oral BID BM Nare Gaspari B, MD   237 mL at 09/27/17 1711  . haloperidol (HALDOL) tablet 1 mg  1 mg Oral TID Cashmere Dingley B, MD   1 mg at 09/27/17 1712  . lidocaine-prilocaine (EMLA) cream 1 application  1 application Topical Q M,W,F Clapacs, [name], MD      . magnesium hydroxide (MILK OF MAGNESIA) suspension 30 mL  30 mL Oral Daily PRN Clapacs, [name], MD      . midodrine (PROAMATINE) tablet 10 mg  10 mg Oral TID WC [name], MD   10 mg at 09/27/17 1712  . mirtazapine (REMERON) tablet 15 mg  15 mg Oral QHS Irvin Lizama B, MD   15 mg at 09/27/17 2109  . multivitamin (RENA-VIT) tablet 1 tablet  1 tablet Oral QHS Sami Froh B, MD   1 tablet at 09/27/17 2109  . pantoprazole (PROTONIX) EC tablet 40 mg  40 mg Oral Daily Clapacs, [name], MD   40 mg at  09/27/17 3086  . timolol (TIMOPTIC) 0.5 % ophthalmic solution 1 drop  1 drop Both Eyes BID Clapacs, [name], MD   1 drop at 09/27/17 1712    Lab Results: No results found for this or any previous visit (from the past 48 hour(s)).  Blood Alcohol level:  Lab Results  Component Value Date   ETH <10 57/84/6962    Metabolic Disorder Labs: Lab Results  Component Value Date   HGBA1C 4.8 09/18/2017   MPG 91.06 09/18/2017   No results found for: PROLACTIN Lab Results  Component Value Date   CHOL 251 (H) 09/18/2017   TRIG 124 09/18/2017   HDL 66 09/18/2017   CHOLHDL 3.8 09/18/2017   VLDL 25 09/18/2017   LDLCALC 160 (H) 09/18/2017   LDLCALC 43 05/14/2014    Physical Findings: AIMS: Facial and Oral Movements Muscles of Facial Expression: None, normal Lips and Perioral Area: None, normal Jaw: None, normal Tongue: None, normal,Extremity Movements Upper (arms, wrists, hands, fingers): None, normal Lower (legs, knees, ankles, toes): None, normal, Trunk Movements Neck, shoulders, hips: None, normal, Overall Severity Severity of abnormal movements (highest score from questions above): None, normal Incapacitation due to abnormal movements: None, normal Patient's awareness of abnormal movements (rate only patient's report): No Awareness, Dental Status Current problems with teeth and/or dentures?: No Does patient usually wear dentures?: No  CIWA:    COWS:     Musculoskeletal: Strength & Muscle Tone: within normal limits Gait & Station: normal Patient leans: N/A  Psychiatric Specialty Exam: Physical Exam  Nursing note and vitals reviewed. Psychiatric: Her mood appears anxious. Her affect is labile and inappropriate. Her speech is delayed. She is slowed, withdrawn and actively hallucinating. Thought content is paranoid and delusional. Cognition and memory are impaired. She expresses inappropriate judgment.    Review of Systems  Neurological: Negative.   Psychiatric/Behavioral:  Positive for hallucinations.  All other systems reviewed and are negative.   Blood pressure (!) 136/50, pulse 75, temperature 99.1 F (  37.3 C), temperature source Oral, resp. rate 17, height 5\' 3"  (1.6 m), weight 72.6 kg (160 lb), SpO2 96 %.Body mass index is 28.34 kg/m.  General Appearance: Disheveled  Eye Contact:  Fair  Speech:  Slow  Volume:  Decreased  Mood:  Anxious and Dysphoric  Affect:  Congruent  Thought Process:  Disorganized and Descriptions of Associations: Loose  Orientation:  Other:  person and place  Thought Content:  Illogical, Delusions, Hallucinations: Auditory and Paranoid Ideation  Suicidal Thoughts:  No  Homicidal Thoughts:  No  Memory:  Immediate;   Fair Recent;   Fair Remote;   Fair  Judgement:  Poor  Insight:  Lacking  Psychomotor Activity:  Decreased  Concentration:  Concentration: Poor and Attention Span: Poor  Recall:  Poor  Fund of Knowledge:  Poor  Language:  Poor  Akathisia:  No  Handed:  Right  AIMS (if indicated):     Assets:  Desire for Improvement Financial Resources/Insurance Housing Resilience Social Support  ADL's:  Intact  Cognition:  WNL  Sleep:  Number of Hours: 5     Treatment Plan Summary: Daily contact with patient to assess and evaluate symptoms and progress in treatment and Medication management   Ms. Zavadil is a 72 year old female with a history of bipolar disorder and ESRD admitted for psychotic break.  #Mood/psychosis -discontinueAbilifyand Seroquel -discontinue Haldol, Remeron and Lithium -start Zyprexa 10 mg nightly -continue Tegretol 200 mg BID, Tegretol level 11 -consider ECT  #Autonomic instability -medicine consult is greatly appreciated  #Poor oral intake -Ensure TID -MVI  #UTI -completed course of Keflex  #Dialyses -nephrology input is greatly appreciated - the patient will continue with dialysis -Midrin before each treatment  #GERD -Protonix 40 mg daily  #Glaucoma -continue eye  drops  #Vitamin deficiency -continue B12 and D -vit B12 level 968  #Admission status -voluntary admission  #Disposition -discharge to home -follow up with Dr. Bryna Colander, MD 09/28/2017, 12:34 PM

## 2017-09-28 NOTE — Progress Notes (Signed)
Patient ID: Joyce [name], female   DOB: [birth year], 72 y.o.   MRN: [id number]  Spoke with Dr. Juleen China. The patient refused dialysis today but she does not have the capacity to make medical decisions.  Spoke with the son who understands the need for dialysis and will be here tomorrow morning to sign consent.   Spoke with Dr. Weber Cooks. The patient would benefit from ECT. Dr. Weber Cooks will talk to the son tomorrow as well.

## 2017-09-28 NOTE — Progress Notes (Signed)
1:1 Note   Patient is in her room, in bed, with her assigned MHT.

## 2017-09-29 MED ORDER — OLANZAPINE 5 MG PO TBDP
20.0000 mg | ORAL_TABLET | Freq: Every day | ORAL | Status: DC
  Administered 2017-09-29: 20 mg via ORAL
  Filled 2017-09-29: qty 4

## 2017-09-29 NOTE — Progress Notes (Signed)
Pre Hd Assessment    09/29/17 1050  Neurological  Level of Consciousness Alert  Orientation Level Oriented to person  Respiratory  Respiratory Pattern Regular;Unlabored  Chest Assessment Chest expansion symmetrical  Bilateral Breath Sounds Clear;Diminished  Cough None  Cardiac  Pulse Regular  Heart Sounds S1, S2  ECG Monitor Yes  Vascular  R Radial Pulse +2  L Radial Pulse +2  Edema Generalized  Psychosocial  Psychosocial (WDL) X  Patient Behaviors Flat affect;Calm  Needs Expressed Denies  Emotional support given Given to patient

## 2017-09-29 NOTE — BHH Group Notes (Signed)
09/29/2017 1PM  Type of Therapy/Topic:  Group Therapy:  Feelings about Diagnosis  Participation Level:  Did Not Attend   Description of Group:   This group will allow patients to explore their thoughts and feelings about diagnoses they have received. Patients will be guided to explore their level of understanding and acceptance of these diagnoses. Facilitator will encourage patients to process their thoughts and feelings about the reactions of others to their diagnosis and will guide patients in identifying ways to discuss their diagnosis with significant others in their lives. This group will be process-oriented, with patients participating in exploration of their own experiences, giving and receiving support, and processing challenge from other group members.   Therapeutic Goals: 1. Patient will demonstrate understanding of diagnosis as evidenced by identifying two or more symptoms of the disorder 2. Patient will be able to express two feelings regarding the diagnosis 3. Patient will demonstrate their ability to communicate their needs through discussion and/or role play  Summary of Patient Progress: Patient was encouraged and invited to attend group. Patient did not attend group. Social worker will continue to encourage group participation in the future.      Therapeutic Modalities:   Cognitive Behavioral Therapy Brief Therapy Feelings Identification    Darin Engels, Marlinda Mike 09/29/2017 2:39 PM

## 2017-09-29 NOTE — Progress Notes (Signed)
1:1 Note   Patient is asleep in her room with assigned MHT.

## 2017-09-29 NOTE — Progress Notes (Signed)
1:1 Note   Patient is off of the unit at dialysis with her assigned MHT.

## 2017-09-29 NOTE — Progress Notes (Signed)
1:1 Note  Patient is in her room, in her bed, with assigned MHT.

## 2017-09-29 NOTE — Progress Notes (Signed)
Venous pressure is rising, even with lowered BFR. Pt moves the access arm a lot and is having a time sitting still. MD notified.

## 2017-09-29 NOTE — Progress Notes (Signed)
[place] presence requested as son and daughter-in-law were visiting patient. [place] provided a calming presence, emotional support, and escorted family members off the unit. Patient did not interact during visit other to say she wanted to take the hospital pants off and that she wanted to leave.

## 2017-09-29 NOTE — Progress Notes (Signed)
1:1 Note  Patient is in her room with assigned MHT.

## 2017-09-29 NOTE — Progress Notes (Signed)
   09/29/17 1200  Clinical Encounter Type  Visited With Health care provider  Visit Type Follow-up   Chaplain attempted to follow up with family.  Family were not in conference room or waiting room.  Chaplain checked in with staff who believe that family has left.

## 2017-09-29 NOTE — Progress Notes (Signed)
Post HD Assessment    09/29/17 1340  Neurological  Level of Consciousness Alert  Orientation Level Oriented to person  Respiratory  Respiratory Pattern Regular;Unlabored  Chest Assessment Chest expansion symmetrical  Bilateral Breath Sounds Clear;Diminished  Cough None  Cardiac  Pulse Regular  Heart Sounds S1, S2  ECG Monitor Yes  Vascular  R Radial Pulse +2  L Radial Pulse +2  Edema Generalized  Psychosocial  Psychosocial (WDL) X  Patient Behaviors Flat affect;Calm  Needs Expressed Denies  Emotional support given Given to patient

## 2017-09-29 NOTE — Progress Notes (Signed)
1:1 Note   Patient is in her room with her assigned MHT.

## 2017-09-29 NOTE — BHH Counselor (Signed)
CSW spoke with Trecia Rogers from [place] to speak wit the patient about medicaid eligibility. Joyce [name] will be coming to meet with the patient today to discuss options.   Darin Engels, MSW, [place], LCASA 09/29/2017 9:57 AM

## 2017-09-29 NOTE — Progress Notes (Signed)
1:1 Note   Patient is asleep in her room with her assigned MHT.

## 2017-09-29 NOTE — BHH Counselor (Signed)
CSW was informed that patient was refusing Dialysis and that the son Lanney Gins was coming to sign consent for the patient to receive dialysis since MD felt the patient was incompetent. CSW reviewed patients chart and discovered patient has a healthcare power of attorney and a living will on file. CSW informed and provided a copy to Dr. Bary Leriche about the advance directive including healthcare power of attorney and living will for the patient in her chart. Dr. Bary Leriche reported that she already received a copy of the document from the patient's sister. CSW guided family members to a meeting to speak with [place] counselor Amy Fredderick Severance regarding additional resources for the patient such as Medicaid for her to be able to obtain additional resources once discharged home. After the meeting, [place] and Dossie Arbour contacted Elana Alm, [place] supervisor, with concerns that patient was being given dialysis treatment even though her living will indicates her wishes, including to decline any life-prolonging treatment. MD coordinated with the healthcare power of attorney along with the family to sign for the patient to receive dialysis since she is refusing treatment and is deemed incompetent. CSW Darin Engels and Dossie Arbour provided a copy of healthcare power of attorney and a living will on file and staffed the case with Iven Finn, [facility] director regarding the case in which they were told that the issue will be discussed in their MD meeting today and reviewed by ethics committee.   Darin Engels, MSW, [place], LCASA 09/29/2017 3:28 PM

## 2017-09-29 NOTE — Progress Notes (Signed)
Post HD Tx,    09/29/17 1340  Hand-Off documentation  Report given to (Full Name) Demetria, RN   Report received from (Full Name) Beatris Ship, RN   Vital Signs  Temp 98.9 F (37.2 C)  Temp Source Oral  Pulse Rate 84  Pulse Rate Source Monitor  Resp 16  BP 132/74  BP Location Right Arm  BP Method Automatic  Patient Position (if appropriate) Sitting  Oxygen Therapy  SpO2 99 %  O2 Device Room Air  Pain Assessment  Pain Assessment No/denies pain  Dialysis Weight  Weight (unable to weigh pt in chair )  Type of Weight Post-Dialysis  Post-Hemodialysis Assessment  Rinseback Volume (mL) 250 mL  Dialyzer Clearance Clear  Duration of HD Treatment -hour(s) 2.5 hour(s)  Hemodialysis Intake (mL) 500 mL  UF Total -Machine (mL) 318 mL  Net UF (mL) -182 mL  Tolerated HD Treatment Yes  Post-Hemodialysis Comments Pt stable to return to room   AVG/AVF Arterial Site Held (minutes) 15 minutes  AVG/AVF Venous Site Held (minutes) 5 minutes  Fistula / Graft Left Upper arm Arteriovenous fistula  Placement Date: 08/13/15   Placed prior to admission: Yes  Orientation: Left  Access Location: Upper arm  Access Type: Arteriovenous fistula  Site Condition No complications  Fistula / Graft Assessment Present;Absent ;[place]

## 2017-09-29 NOTE — Progress Notes (Signed)
1:1 Note   Patient is in her room with assigned sitter attempting to eat breakfast.

## 2017-09-29 NOTE — Progress Notes (Signed)
1:1 Note  Patient is off of the unit at dialysis with her assigned MHT.

## 2017-09-29 NOTE — Progress Notes (Signed)
1:1 Note  Patient is in her room with her assigned sitter.

## 2017-09-29 NOTE — Progress Notes (Signed)
Pre hd 

## 2017-09-29 NOTE — Plan of Care (Signed)
Patient has not experienced any complications related to bowel motility or urinary retention. Patient's overall comfort level appears to be fine, but patient is still confused and disorganized. Patient denies SI/HI/AVH and pain at this time. Patient stated "I want to go home, get the doctors to let me out of here". Patient has not verbalized understanding of her prescribed therapeutic regimen because she is very confused and is disorganized. Patient has shown some positive coping skills today by taking her morning medications for this writer as well as she went to her dialysis treatment today even though she refused treatment when this Probation officer first asked her. Patient has not achieved adequate nutrition today, but she did take a few bites of her muffin for breakfast, she drank a cup of orange juice and has taken a few sips of water and ice chips throughout the day. Patient states that she slept ok last night. Patient has remained free from injury and is safe on the unit at this time.

## 2017-09-29 NOTE — Progress Notes (Signed)
Patient is alert and oriented x 2 with periods of confusion to place and situation. Patient's affect is flat and sad she appears  tired and restless. Patient was encouraged to come out of the room but she refused and remains withdrawn, her eating is very poor; so Probation officer encouraged her to drink some fluids and eat snacks and she was receptive to that. Patient currently denies SI/HI/AVH and contracts for safety, patient was also encouraged to use the incentive spirometer and asked to show some return demonstration. Per order patient is to be on 1:1 while awake.. Patient was complaint with medication and she eat about 25% of the evening snacks. 15 minutes checks maintained, will continue to closely monitor.

## 2017-09-29 NOTE — Progress Notes (Signed)
   09/29/17 0954  Clinical Encounter Type  Visited With Family;Health care provider  Visit Type Follow-up  Spiritual Encounters  Spiritual Needs Emotional   Chaplain responded to page regarding patient family needs.  Chaplain met with patient's doctor who updated chaplain on current situation and shared copy of patient's advanced directives which are part of family conversation at present.  Chaplain met with patient's sister, son, and daughter-in-law.  Chaplain provided education on advanced directive protocol in hospital.  Chaplain offered emotional support and utilized active listening to family members as they shared their thoughts, feelings, and questions.  Family appears to agree on treatment for patient; however, questions exist about the patient's advanced directive and what information patient gave to each family member.  Chaplain named feeling a lot of emotion in the room and offered to meet individually with each person.  Patient's sister indicated that she would like to speak with the chaplain one-to-one.  At this point, social worker entered and explained that the financial counselor would like to meet with the family.  Family left to meet with the financial counselor. Chaplain indicated that she would attempt to check in with the family later.  Chaplain spoke with patient's nurse to update her to chaplain involvement.  Chaplain encouraged nurse to page on-call chaplain as needed, but also informed her that this chaplain will continue to monitor family need for support.

## 2017-09-29 NOTE — Progress Notes (Signed)
D- Patient alert and oriented. Patient presents in a sad/depressed mood on assessment stating that 'I want to go home; get the doctors to let me out of here". Patient is still confused and disoriented on approach. Patient continues to refuse dialysis, but staff is continuing to encourage patient to go for treatment. Patient denies SI, HI, AVH, and pain at this time. Patient has no stated goal for today.  A- Scheduled medications administered to patient, per MD orders. Support and encouragement provided.  Routine safety checks conducted every 15 minutes.  Patient informed to notify staff with problems or concerns.  R- No adverse drug reactions noted. Patient contracts for safety at this time. Patient compliant with medications and treatment plan. Patient receptive, calm, and cooperative. Patient interacts well with others on the unit.  Patient remains safe at this time.

## 2017-09-29 NOTE — Progress Notes (Signed)
[facility]  ROUNDING NOTE   Subjective:   Patient seen and examined on hemodialysis.   Patient moving her access arm during treatment. Agitated.     HEMODIALYSIS FLOWSHEET:  Blood Flow Rate (mL/min): 215 mL/min Arterial Pressure (mmHg): -180 mmHg Venous Pressure (mmHg): 300 mmHg Transmembrane Pressure (mmHg): 70 mmHg Ultrafiltration Rate (mL/min): 140 mL/min Dialysate Flow Rate (mL/min): 800 ml/min Conductivity: Machine : 14 Conductivity: Machine : 14 Dialysis Fluid Bolus: Normal Saline Bolus Amount (mL): 250 mL Dialysate Change: (3k 2.5ca)  Objective:  Vital signs in last 24 hours:  Temp:  [98.7 F (37.1 C)-99.1 F (37.3 C)] 98.9 F (37.2 C) (03/12 1340) Pulse Rate:  [76-95] 84 (03/12 1340) Resp:  [14-20] 16 (03/12 1340) BP: (116-155)/(62-78) 132/74 (03/12 1340) SpO2:  [96 %-99 %] 99 % (03/12 1340)  Weight change:  Filed Weights    Intake/Output: No intake/output data recorded.   Intake/Output this shift:  Total I/O In: 0  Out: -182   Physical Exam: General: NAD, laying in bed  Head: Normocephalic, atraumatic. Moist oral mucosal membranes  Eyes: Anicteric  Neck: Supple, trachea midline  Lungs:  Clear to auscultation  Heart: Regular rate and rhythm  Abdomen:  Soft, nontender  Extremities: no peripheral edema.  Neurologic: Nonfocal, moving all four extremities  Skin: No lesions  Access: Left AVF    Basic Metabolic Panel: Recent Labs  Lab 09/23/17 1108 09/25/17 1043 09/28/17 1335  NA  --  138 138  K  --  3.0* 4.0  CL  --  96* 96*  CO2  --  26 27  GLUCOSE  --  170* 108*  BUN  --  40* 54*  CREATININE  --  6.61* 7.19*  CALCIUM  --  9.3 9.6  PHOS 3.4  --  3.1    Liver Function Tests: Recent Labs  Lab 09/28/17 1335  ALBUMIN 3.9   No results for input(s): LIPASE, AMYLASE in the last 168 hours. Recent Labs  Lab 09/23/17 0702  AMMONIA 13    CBC: Recent Labs  Lab 09/28/17 1335  WBC 8.6  HGB 11.9*  HCT 36.2  MCV 95.8   PLT 274    Cardiac Enzymes: Recent Labs  Lab 09/23/17 0045 09/23/17 0702 09/23/17 1108  TROPONINI 0.04* 0.05* 0.05*    BNP: Invalid input(s): POCBNP  CBG: Recent Labs  Lab 09/22/17 1914  GLUCAP 128*    Microbiology: Results for orders placed or performed during the hospital encounter of 09/01/17  Urine culture     Status: Abnormal   Collection Time: 09/01/17  4:32 PM  Result Value Ref Range Status   Specimen Description   Final    URINE, RANDOM Performed at [facility], [address], [place], [place] [address]    Special Requests   Final    NONE Performed at [facility], [address] [place], [place] [address]    Culture MULTIPLE SPECIES PRESENT, SUGGEST RECOLLECTION (A)  Final   Report Status 09/03/2017 FINAL  Final  Culture, blood (routine x 2)     Status: None   Collection Time: 09/01/17  6:05 PM  Result Value Ref Range Status   Specimen Description BLOOD RIGHT HAND  Final   Special Requests   Final    BOTTLES DRAWN AEROBIC AND ANAEROBIC Blood Culture results may not be optimal due to an excessive volume of blood received in culture bottles   Culture   Final    NO GROWTH 5 DAYS Performed at [facility], [address] [place], [place] [address]    Report Status 09/06/2017 FINAL  Final  Culture, blood (routine x 2)     Status: None   Collection Time: 09/01/17  6:34 PM  Result Value Ref Range Status   Specimen Description BLOOD RAC  Final   Special Requests   Final    BOTTLES DRAWN AEROBIC AND ANAEROBIC Blood Culture adequate volume   Culture   Final    NO GROWTH 5 DAYS Performed at [facility], [address], [place],  [address]    Report Status 09/06/2017 FINAL  Final    Coagulation Studies: No results for input(s): LABPROT, INR in the last 72 hours.  Urinalysis: No results for input(s): COLORURINE, LABSPEC, PHURINE, GLUCOSEU, HGBUR, BILIRUBINUR, KETONESUR, PROTEINUR, UROBILINOGEN, NITRITE,  LEUKOCYTESUR in the last 72 hours.  Invalid input(s): APPERANCEUR    Imaging: No results found.   Medications:    . brimonidine  1 drop Both Eyes BID  . carbamazepine  200 mg Oral BID  . cholecalciferol  1,000 Units Oral Daily  . [START ON 10/17/2017] cyanocobalamin  1,000 mcg Intramuscular Q30 days  . dicyclomine  20 mg Oral TID AC  . feeding supplement (ENSURE ENLIVE)  237 mL Oral BID BM  . lidocaine-prilocaine  1 application Topical Q M,W,F  . midodrine  10 mg Oral TID WC  . multivitamin  1 tablet Oral QHS  . OLANZapine zydis  20 mg Oral QHS  . pantoprazole  40 mg Oral Daily  . timolol  1 drop Both Eyes BID   acetaminophen, alum & mag hydroxide-simeth, bisacodyl, magnesium hydroxide, temazepam  Assessment/ Plan:  [name] is a 72 y.o. white female with end stage renal disease on hemodialysis, diverticulosis, hypotension, bipolar disorder, polycystic kidney disease   [facility]/MWF  1.  ESRD on HD MWF. No UF with treatment - Plan on hemodialysis treatment tomorrow and resume MWF  2.  Anemia of CKD: hemoglobin 11.9 - Holding EPO  3.  Secondary hyperparathyroidism: Phosphorus and calcium at goal. PTH 150.  Not currently on binders.   4. Hypotension:  continue midodrine prior to dialysis.    LOS: 12 Joyce [name] 3/12/20195:41 PM

## 2017-09-29 NOTE — Progress Notes (Addendum)
[facility] MD Progress Note  09/29/2017 3:37 PM Joyce [name]  MRN:  [id number]  Subjective:   Joyce [name] appears very psychotic. She refused dialysis today again but received treatment after her sister who has HCPOA signed consent. The patient took morning medications with outmost encouragement. She did not eat breakfast or lunch. She only takes sips of water. There is tremendous latency of response likely due to disorganized thinking or hallucinations. Has no complaints and "wants to go".   Met with her sister Joyce [name] who has [place]. Met with her son and his wife to discuss treatment. The son met with the patient briefly. She recognized him but was in no shape to talk.  Departmental meeting today was devoted to discuss this patient.  Treatment plan.  1. We will continue Tegretol 200 mg BID and Zyprexa 20 mg for depression, psychosis and mood stabilization. ECT still a possibility if delirium ruled out. Forced medications may be necessary.    2. Spoke with Dr. Juleen China who provides best care.    3. Spoke with neurology on call. They will see the patient in am.     4. Supportive care, 1:1, encourage food and drink.    5. Due to family conflict between the son and his wife and the sister who is HCPOA, the chaplain was involved. Request for Ethics Committee consult was made.   Social/disposition. TBE. It is unlikely the patient will be able to live independently. Financial counselors involved to attempt Medicaid application.  Principal Problem: Bipolar affective disorder, current episode manic with psychotic symptoms ([place]) Diagnosis:   Patient Active Problem List   Diagnosis Date Noted  . Bipolar affective disorder, current episode manic with psychotic symptoms ([place]) [F31.2] 09/17/2017    Priority: High  . Bipolar I disorder, most recent episode (or current) manic ([place]) [F31.10] 09/02/2017  . Acute delirium [R41.0] 09/02/2017  . Altered mental status [R41.82]   . Acute encephalopathy [G93.40]  09/01/2017  . ESRD on dialysis ([place]) [N18.6, Z99.2] 04/06/2017  . Complication of vascular access for dialysis [T82.9XXA] 04/06/2017  . Hematuria [R31.9] 12/18/2016  . Polycystic kidney [Q61.3] 10/12/2016  . Ruptured cyst of kidney [Q61.00] 10/03/2016  . HTN (hypertension) [I10] 10/01/2016  . Chronic kidney disease [N18.9] 10/01/2016  . Anemia associated with chronic renal failure [D63.1] 09/24/2016  . Acute kidney insufficiency [N28.9] 07/28/2016  . Noninfectious diarrhea [K52.9]   . Benign neoplasm of cecum [D12.0]   . Benign neoplasm of ascending colon [D12.2]   . Diarrhea [R19.7]   . Nausea [R11.0]   . Gastric polyp [K31.7]   . Chronic constipation [K59.09] 12/27/2014  . Nausea with vomiting [R11.2] 12/27/2014  . Left sided abdominal pain [R10.9] 12/27/2014  . Personal history of colonic polyps [Z86.010] 05/10/2013   Total Time spent with patient: 1 hour  Past Psychiatric History: bipolar disorder  Past Medical History:  Past Medical History:  Diagnosis Date  . Anal fissure   . Bipolar affective disorder ([place])   . CKD (chronic kidney disease)    Dr Holley Raring [name] 4  . Colon polyps   . Diverticulitis   . Diverticulitis   . Family history of adverse reaction to anesthesia    mom - PONV  . GERD (gastroesophageal reflux disease)   . Headache    migraines - none over 10 yrs  . Heart murmur   . History of hiatal hernia   . Hypertension   . Pancreatitis    Valproic acid  . Vertigo  Past Surgical History:  Procedure Laterality Date  . A/V FISTULAGRAM Left 04/14/2017   Procedure: A/V Fistulagram;  Surgeon: Katha Cabal, MD;  Location: [facility];  Service: Cardiovascular;  Laterality: Left;  . A/V FISTULAGRAM Left 06/09/2017   Procedure: A/V FISTULAGRAM;  Surgeon: Katha Cabal, MD;  Location: [facility];  Service: Cardiovascular;  Laterality: Left;  . ABDOMINAL HYSTERECTOMY  1990 ?  . AV FISTULA PLACEMENT  4/30  . BREAST EXCISIONAL  BIOPSY Left 1994   neg surgical bx  . CHOLECYSTECTOMY  2003  . COLONOSCOPY  2014   Dr. Jamal Collin  . COLONOSCOPY WITH PROPOFOL N/A 09/24/2015   Procedure: COLONOSCOPY WITH random colon byopies.;  Surgeon: Lucilla Lame, MD;  Location: [facility];  Service: Endoscopy;  Laterality: N/A;  . ESOPHAGOGASTRODUODENOSCOPY (EGD) WITH PROPOFOL N/A 09/24/2015   Procedure: ESOPHAGOGASTRODUODENOSCOPY (EGD) ;  Surgeon: Lucilla Lame, MD;  Location: [facility];  Service: Endoscopy;  Laterality: N/A;  . EYE SURGERY    . PERIPHERAL VASCULAR CATHETERIZATION N/A 05/29/2015   Procedure: A/V Shuntogram/Fistulagram;  Surgeon: Katha Cabal, MD;  Location: [facility];  Service: Cardiovascular;  Laterality: N/A;  . PERIPHERAL VASCULAR CATHETERIZATION N/A 05/29/2015   Procedure: A/V Shunt Intervention;  Surgeon: Katha Cabal, MD;  Location: [facility];  Service: Cardiovascular;  Laterality: N/A;  . POLYPECTOMY  09/24/2015   Procedure: POLYPECTOMY INTESTINAL;  Surgeon: Lucilla Lame, MD;  Location: [facility];  Service: Endoscopy;;  cecal polyp ascending polyp   Family History:  Family History  Problem Relation Age of Onset  . Stroke Father   . Hypertension Father   . Breast cancer Other   . Colon cancer Neg Hx   . Liver disease Neg Hx    Family Psychiatric  History: none reported Social History:  Social History   Substance and Sexual Activity  Alcohol Use No  . Alcohol/week: 0.0 oz     Social History   Substance and Sexual Activity  Drug Use No    Social History   Socioeconomic History  . Marital status: Single    Spouse name: None  . Number of children: 1  . Years of education: None  . Highest education level: None  Social Needs  . Financial resource strain: None  . Food insecurity - worry: None  . Food insecurity - inability: None  . Transportation needs - medical: None  . Transportation needs - non-medical: None  Occupational History  .  Occupation: Surveyor, quantity: [employer]  Tobacco Use  . Smoking status: Never Smoker  . Smokeless tobacco: Never Used  Substance and Sexual Activity  . Alcohol use: No    Alcohol/week: 0.0 oz  . Drug use: No  . Sexual activity: Not Currently  Other Topics Concern  . 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   Additional Social History:    History of alcohol / drug use?: No history of alcohol / drug abuse                    Sleep: Fair  Appetite:  Poor  Current Medications: Current Facility-Administered Medications  Medication Dose Route Frequency Provider Last Rate Last Dose  . acetaminophen (TYLENOL) tablet 325 mg  325 mg Oral BID PRN Clapacs, [name], MD   325 mg at 09/27/17 1225  . alum & mag hydroxide-simeth (  MAALOX/MYLANTA) 200-200-20 MG/5ML suspension 30 mL  30 mL Oral Q4H PRN Clapacs, [name], MD      . bisacodyl (DULCOLAX) EC tablet 5 mg  5 mg Oral Daily PRN Clapacs, [name], MD      . brimonidine (ALPHAGAN) 0.2 % ophthalmic solution 1 drop  1 drop Both Eyes BID Clapacs, [name], MD   1 drop at 09/29/17 0850  . carbamazepine (TEGRETOL) tablet 200 mg  200 mg Oral BID Clapacs, [name], MD   200 mg at 09/29/17 0851  . cholecalciferol (VITAMIN D) tablet 1,000 Units  1,000 Units Oral Daily Clapacs, [name], MD   1,000 Units at 09/29/17 0851  . [START ON 10/17/2017] cyanocobalamin ((VITAMIN B-12)) injection 1,000 mcg  1,000 mcg Intramuscular Q30 days Clapacs, [name], MD      . dicyclomine (BENTYL) tablet 20 mg  20 mg Oral TID AC Clapacs, [name], MD   20 mg at 09/29/17 0851  . feeding supplement (ENSURE ENLIVE) (ENSURE ENLIVE) liquid 237 mL  237 mL Oral BID BM Dina Mobley B, MD   237 mL at 09/27/17 1711  . lidocaine-prilocaine (EMLA) cream 1 application  1 application Topical Q M,W,F Clapacs, [name], MD      . magnesium hydroxide (MILK OF MAGNESIA) suspension 30 mL  30 mL Oral Daily PRN  Clapacs, [name], MD      . midodrine (PROAMATINE) tablet 10 mg  10 mg Oral TID WC [name], MD   10 mg at 09/29/17 0851  . multivitamin (RENA-VIT) tablet 1 tablet  1 tablet Oral QHS Cynthie Garmon B, MD   1 tablet at 09/28/17 2233  . OLANZapine zydis (ZYPREXA) disintegrating tablet 10 mg  10 mg Oral QHS Josephyne Tarter B, MD   10 mg at 09/28/17 2233  . pantoprazole (PROTONIX) EC tablet 40 mg  40 mg Oral Daily Clapacs, [name], MD   40 mg at 09/29/17 0851  . temazepam (RESTORIL) capsule 15 mg  15 mg Oral QHS PRN Altovise Wahler B, MD   15 mg at 09/28/17 2234  . timolol (TIMOPTIC) 0.5 % ophthalmic solution 1 drop  1 drop Both Eyes BID Clapacs, [name], MD   1 drop at 09/29/17 0850    Lab Results:  Results for orders placed or performed during the hospital encounter of 09/17/17 (from the past 48 hour(s))  Renal function panel     Status: Abnormal   Collection Time: 09/28/17  1:35 PM  Result Value Ref Range   Sodium 138 135 - 145 mmol/L   Potassium 4.0 3.5 - 5.1 mmol/L   Chloride 96 (L) 101 - 111 mmol/L   CO2 27 22 - 32 mmol/L   Glucose, Bld 108 (H) 65 - 99 mg/dL   BUN 54 (H) 6 - 20 mg/dL   Creatinine, Ser 7.19 (H) 0.44 - 1.00 mg/dL   Calcium 9.6 8.9 - 10.3 mg/dL   Phosphorus 3.1 2.5 - 4.6 mg/dL   Albumin 3.9 3.5 - 5.0 g/dL   GFR calc non Af Amer 5 (L) >60 mL/min   GFR calc Af Amer 6 (L) >60 mL/min    Comment: (NOTE) The eGFR has been calculated using the CKD EPI equation. This calculation has not been validated in all clinical situations. eGFR's persistently <60 mL/min signify possible Chronic Kidney Disease.    Anion gap 15 5 - 15    Comment: Performed at [facility], [address] [place], [place] [address]  CBC  Status: Abnormal   Collection Time: 09/28/17  1:35 PM  Result Value Ref Range   WBC 8.6 3.6 - 11.0 K/uL   RBC 3.77 (L) 3.80 - 5.20 MIL/uL   Hemoglobin 11.9 (L) 12.0 - 16.0 g/dL   HCT 36.2 35.0 - 47.0 %   MCV 95.8 80.0 - 100.0 fL    MCH 31.6 26.0 - 34.0 pg   MCHC 32.9 32.0 - 36.0 g/dL   RDW 14.3 11.5 - 14.5 %   Platelets 274 150 - 440 K/uL    Comment: Performed at [facility], [address] [place], [place] [address]    Blood Alcohol level:  Lab Results  Component Value Date   [facility] <10 00/76/2263    Metabolic Disorder Labs: Lab Results  Component Value Date   HGBA1C 4.8 09/18/2017   MPG 91.06 09/18/2017   No results found for: PROLACTIN Lab Results  Component Value Date   CHOL 251 (H) 09/18/2017   TRIG 124 09/18/2017   HDL 66 09/18/2017   CHOLHDL 3.8 09/18/2017   VLDL 25 09/18/2017   LDLCALC 160 (H) 09/18/2017   LDLCALC 43 05/14/2014    Physical Findings: AIMS: Facial and Oral Movements Muscles of Facial Expression: None, normal Lips and Perioral Area: None, normal Jaw: None, normal Tongue: None, normal,Extremity Movements Upper (arms, wrists, hands, fingers): None, normal Lower (legs, knees, ankles, toes): None, normal, Trunk Movements Neck, shoulders, hips: None, normal, Overall Severity Severity of abnormal movements (highest score from questions above): None, normal Incapacitation due to abnormal movements: None, normal Patient's awareness of abnormal movements (rate only patient's report): No Awareness, Dental Status Current problems with teeth and/or dentures?: No Does patient usually wear dentures?: No  CIWA:    COWS:     Musculoskeletal: Strength & Muscle Tone: within normal limits Gait & Station: normal Patient leans: N/A  Psychiatric Specialty Exam: Physical Exam  Nursing note and vitals reviewed. Psychiatric: Her affect is blunt and inappropriate. Her speech is delayed. She is slowed and withdrawn. Thought content is paranoid and delusional. Cognition and memory are impaired. She expresses inappropriate judgment.    Review of Systems  Neurological: Negative.   Psychiatric/Behavioral: Positive for memory loss. The patient is nervous/anxious.   All other systems  reviewed and are negative.   Blood pressure 132/74, pulse 84, temperature 98.9 F (37.2 C), temperature source Oral, resp. rate 16, height _0  (1.6 m), weight 72.6 kg (160 lb), SpO2 99 %.Body mass index is 28.34 kg/m.  General Appearance: Disheveled  Eye Contact:  Fair  Speech:  Blocked  Volume:  Decreased  Mood:  Anxious  Affect:  Flat  Thought Process:  Disorganized, Irrelevant and Descriptions of Associations: Loose  Orientation:  Other:  person and place  Thought Content:  Delusions, Hallucinations: Auditory and Paranoid Ideation  Suicidal Thoughts:  No  Homicidal Thoughts:  No  Memory:  Immediate;   Poor Recent;   Poor Remote;   Poor  Judgement:  Poor  Insight:  Lacking  Psychomotor Activity:  Decreased  Concentration:  Concentration: Poor and Attention Span: Poor  Recall:  Poor  Fund of Knowledge:  Poor  Language:  Poor  Akathisia:  No  Handed:  Right  AIMS (if indicated):     Assets:  Communication Skills Desire for Improvement Financial Resources/Insurance Housing Resilience Social Support  ADL's:  Intact  Cognition:  WNL  Sleep:  Number of Hours: 7.15     Treatment Plan Summary: Daily contact with patient to assess and evaluate  symptoms and progress in treatment and Medication management   Ms. Lasker is a 72 year old female with a history of bipolar disorder and ESRD admitted for psychotic break.  #Mood/psychosis -we discontinued Abilify and Seroquel prescribed in the community -discontinue Haldol, Remeron and Lithium given briefly over the weekend -continue Zyprexa, increase to 20 mg nightly  -continue Tegretol 200 mg BID, Tegretol level 11 -consider ECT  #AMS -neurology consult  #Autonomic instability -medicine consult is greatly appreciated  #Poor oral intake -Ensure TID -MVI  #UTI -completed course of Keflex  #Dialyses -nephrology input is greatly appreciated -patient will continue with dialysis -Midrin before each  treatment  #GERD -Protonix 40 mg daily  #Glaucoma -continue eye drops  #Vitamin deficiency -continue B12 and D -vit B12 [id number]  #Admission status -voluntary admission  #Disposition -discharge to home -follow up with Dr. Thurmond Butts  I certify that the services received since the previous certification/recertification were and continue to be medically necessary as the treatment provided can be reasonably expected to improve the patient's condition; the medical record documents that the services furnished were intensive treatment services or their equivalent services, and this patient continues to need, on a daily basis, active treatment furnished directly by or requiring the supervision of inpatient psychiatric personnel.     Orson Slick, MD 09/29/2017, 3:37 PM

## 2017-09-29 NOTE — Progress Notes (Signed)
1:1 Note   Patient is in her room with assigned MHT.

## 2017-09-29 NOTE — Progress Notes (Signed)
HD initiated via L AVF without issue. Patient currently resting with eyes closed. Sitter at chairside in HD unit

## 2017-09-29 NOTE — Progress Notes (Signed)
1:1 Note   Patient is off the unit at dialysis with her assigned MHT.

## 2017-09-29 NOTE — Progress Notes (Signed)
TX ended early due to rising venous pressures. MD notified, pt to return for tx tomorrow after ECT.

## 2017-09-29 NOTE — Progress Notes (Signed)
1:1 Note  Patient is in her room with her assigned MHT.

## 2017-09-29 NOTE — [facility] Group Notes (Signed)
  09/29/2017  Time: 0900  Type of Therapy and Topic: Group Therapy: Goals Group: SMART Goals   Participation Level:  Did Not Attend   Description of Group:   The purpose of a daily goals group is to assist and guide patients in setting recovery/wellness-related goals. The objective is to set goals as they relate to the crisis in which they were admitted. Patients will be using SMART goal modalities to set measurable goals. Characteristics of realistic goals will be discussed and patients will be assisted in setting and processing how one will reach their goal. Facilitator will also assist patients in applying interventions and coping skills learned in psycho-education groups to the SMART goal and process how one will achieve defined goal.   Therapeutic Goals:  -Patients will develop and document one goal related to or their crisis in which brought them into treatment.  -Patients will be guided by LCSW using SMART goal setting modality in how to set a measurable, attainable, realistic and time sensitive goal.  -Patients will process barriers in reaching goal.  -Patients will process interventions in how to overcome and successful in reaching goal.   Patient's Goal:  Pt was invited to attend group but chose not to attend. CSW will continue to encourage pt to attend group throughout their admission.    Therapeutic Modalities:  Motivational Interviewing  Cognitive Behavioral Therapy  Crisis Intervention Model  SMART goals setting   Alden Hipp, MSW, LCSW 09/29/2017 9:45 AM

## 2017-09-30 ENCOUNTER — Encounter: Payer: Self-pay | Admitting: Internal Medicine

## 2017-09-30 ENCOUNTER — Other Ambulatory Visit: Payer: Self-pay | Admitting: *Deleted

## 2017-09-30 DIAGNOSIS — R4182 Altered mental status, unspecified: Secondary | ICD-10-CM

## 2017-09-30 MED ORDER — OLANZAPINE 5 MG PO TBDP
5.0000 mg | ORAL_TABLET | Freq: Three times a day (TID) | ORAL | Status: DC
  Administered 2017-09-30 – 2017-10-12 (×25): 5 mg via ORAL
  Filled 2017-09-30 (×28): qty 1

## 2017-09-30 NOTE — Consult Note (Signed)
Reason for Consult:AMS Referring Physician: [name]  CC: AMS  HPI: Joyce [name] is an 72 y.o. female who is unable to provide any history due to her mental status.  Family not available therefore all history obtained from the chart.  Patient with a history of BPD admitted on 3/1 with mania and psychotic symptoms.  Patient now altered.  Is not eating or drinking.  Consult called for possibility that there may be other issues affecting patient's mental status.  Patient has had some syncope and generalized weakness since admission that was felt to be secondary to her poor po intake in association with her ESRD.    Past Medical History:  Diagnosis Date  . Anal fissure   . Bipolar affective disorder ([place])   . CKD (chronic kidney disease)    Dr Holley Raring [name] 4  . Colon polyps   . Diverticulitis   . Diverticulitis   . Family history of adverse reaction to anesthesia    mom - PONV  . GERD (gastroesophageal reflux disease)   . Headache    migraines - none over 10 yrs  . Heart murmur   . History of hiatal hernia   . Hypertension   . Pancreatitis    Valproic acid  . Vertigo     Past Surgical History:  Procedure Laterality Date  . A/V FISTULAGRAM Left 04/14/2017   Procedure: A/V Fistulagram;  Surgeon: Katha Cabal, MD;  Location: [place] [facility];  Service: Cardiovascular;  Laterality: Left;  . A/V FISTULAGRAM Left 06/09/2017   Procedure: A/V FISTULAGRAM;  Surgeon: Katha Cabal, MD;  Location: [place] [facility];  Service: Cardiovascular;  Laterality: Left;  . ABDOMINAL HYSTERECTOMY  1990 ?  . AV FISTULA PLACEMENT  4/30  . BREAST EXCISIONAL BIOPSY Left 1994   neg surgical bx  . CHOLECYSTECTOMY  2003  . COLONOSCOPY  2014   Dr. Jamal Collin  . COLONOSCOPY WITH PROPOFOL N/A 09/24/2015   Procedure: COLONOSCOPY WITH random colon byopies.;  Surgeon: Lucilla Lame, MD;  Location: [facility];  Service: Endoscopy;  Laterality: N/A;  . ESOPHAGOGASTRODUODENOSCOPY (EGD)  WITH PROPOFOL N/A 09/24/2015   Procedure: ESOPHAGOGASTRODUODENOSCOPY (EGD) ;  Surgeon: Lucilla Lame, MD;  Location: [facility];  Service: Endoscopy;  Laterality: N/A;  . EYE SURGERY    . PERIPHERAL VASCULAR CATHETERIZATION N/A 05/29/2015   Procedure: A/V Shuntogram/Fistulagram;  Surgeon: Katha Cabal, MD;  Location: [place] [facility];  Service: Cardiovascular;  Laterality: N/A;  . PERIPHERAL VASCULAR CATHETERIZATION N/A 05/29/2015   Procedure: A/V Shunt Intervention;  Surgeon: Katha Cabal, MD;  Location: [place];  Service: Cardiovascular;  Laterality: N/A;  . POLYPECTOMY  09/24/2015   Procedure: POLYPECTOMY INTESTINAL;  Surgeon: Lucilla Lame, MD;  Location: [facility];  Service: Endoscopy;;  cecal polyp ascending polyp    Family History  Problem Relation Age of Onset  . Stroke Father   . Hypertension Father   . Breast cancer Other   . Colon cancer Neg Hx   . Liver disease Neg Hx     Social History:  reports that  has never smoked. she has never used smokeless tobacco. She reports that she does not drink alcohol or use drugs.  Allergies  Allergen Reactions  . Morphine And Related Shortness Of Breath    Pt reports chest pain and difficulty breathing.  . Indomethacin Hives  . Pollen Extract Other (See Comments)    Sinus problems and HA    Medications:  I  have reviewed the patient's current medications. Prior to Admission:  Medications Prior to Admission  Medication Sig Dispense Refill Last Dose  . acetaminophen (TYLENOL) 325 MG tablet Take 325 mg 2 (two) times daily as needed by mouth for moderate pain or headache.    unknown  . ARIPiprazole (ABILIFY) 2 MG tablet Take 2 mg by mouth daily.   unknown  . bisacodyl (DULCOLAX) 5 MG EC tablet Take 1 tablet (5 mg total) by mouth daily as needed for moderate constipation. (Patient not taking: Reported on 04/09/2017) 30 tablet 0 Not Taking at Unknown time  . brimonidine (ALPHAGAN) 0.2 % ophthalmic  solution Place 2 (two) times daily into both eyes.   unknown  . carbamazepine (TEGRETOL) 200 MG tablet Take 200 mg by mouth 2 (two) times daily.   unknown  . cephALEXin (KEFLEX) 500 MG capsule Take 1 capsule (500 mg total) by mouth every 12 (twelve) hours. (Patient not taking: Reported on 09/16/2017) 5 capsule 0 Completed Course at Unknown time  . cholecalciferol (VITAMIN D) 1000 UNITS tablet Take 1,000 Units by mouth daily.   unknown  . cyanocobalamin (,VITAMIN B-12,) 1000 MCG/ML injection Inject 1,000 mcg into the muscle every 30 (thirty) days.   unknown  . dicyclomine (BENTYL) 20 MG tablet Take 1 tablet (20 mg total) by mouth 3 (three) times daily before meals. 30 tablet 0 unknown  . lidocaine-prilocaine (EMLA) cream Apply 1 application every Monday, Wednesday, and Friday topically. At dialysis  3 Taking  . midodrine (PROAMATINE) 10 MG tablet Take 10 mg by mouth daily.    unknown  . pantoprazole (PROTONIX) 40 MG tablet TAKE 1 TABLET EVERY DAY 90 tablet 3 unknown  . pramipexole (MIRAPEX) 0.125 MG tablet Take 0.125 mg by mouth daily at 2 PM.  3 unknown  . QUEtiapine (SEROQUEL) 25 MG tablet Take 50 mg by mouth at bedtime.    unknown  . timolol (TIMOPTIC) 0.5 % ophthalmic solution Place 1 drop 2 (two) times daily into both eyes.    unknown   Scheduled: . brimonidine  1 drop Both Eyes BID  . carbamazepine  200 mg Oral BID  . [START ON 10/17/2017] cyanocobalamin  1,000 mcg Intramuscular Q30 days  . dicyclomine  20 mg Oral TID AC  . feeding supplement (ENSURE ENLIVE)  237 mL Oral BID BM  . lidocaine-prilocaine  1 application Topical Q M,W,F  . midodrine  10 mg Oral TID WC  . multivitamin  1 tablet Oral QHS  . OLANZapine zydis  20 mg Oral QHS  . pantoprazole  40 mg Oral Daily  . timolol  1 drop Both Eyes BID    ROS: Patient unable to provide due to mental status  Physical Examination: Blood pressure (!) 161/68, pulse (!) 113, temperature 98.9 F (37.2 C), temperature source Oral, resp. rate  18, height 5\' 3"  (1.6 m), weight 72.6 kg (160 lb), SpO2 99 %.  HEENT-  Normocephalic, no lesions, without obvious abnormality.  Normal external eye and conjunctiva.  Normal TM's bilaterally.  Normal auditory canals and external ears. Normal external nose, mucus membranes and septum.  Normal pharynx. Cardiovascular- S1, S2 normal, pulses palpable throughout   Lungs- chest clear, no wheezing, rales, normal symmetric air entry Abdomen- soft, non-tender; bowel sounds normal; no masses,  no organomegaly Extremities- no edema Lymph-no adenopathy palpable Musculoskeletal-no joint tenderness, deformity or swelling Skin-warm and dry, no hyperpigmentation, vitiligo, or suspicious lesions  Neurological Examination   Mental Status: Alert.  Speech often markedly dysarthric and at other  times quire fluent.  Will answer no questions concerning orientation.  Does not follow commands. Agitated. Cranial Nerves: II: Blinks to bilateral confrontation.  Pupils equal, round, reactive to light and accommodation III,IV, VI: ptosis not present, extra-ocular motions grossly intact bilaterally V,VII: grimace symmetric VIII: unable to test IX,X: unable to test XI: unable to test XII: unable to test Motor: Moves all extremities against gravity.  Will not cooperate for formal testing.   Sensory: Responds to light touch throughout. Deep Tendon Reflexes: 2+ and symmetric with absent AJ's bilaterally Plantars: Right: mute   Left: mute Cerebellar: unable to test Gait: unable to test due to safety concerns    Laboratory Studies:   Basic Metabolic Panel: Recent Labs  Lab 09/25/17 1043 09/28/17 1335  NA 138 138  K 3.0* 4.0  CL 96* 96*  CO2 26 27  GLUCOSE 170* 108*  BUN 40* 54*  CREATININE 6.61* 7.19*  CALCIUM 9.3 9.6  PHOS  --  3.1    Liver Function Tests: Recent Labs  Lab 09/28/17 1335  ALBUMIN 3.9   No results for input(s): LIPASE, AMYLASE in the last 168 hours. No results for input(s): AMMONIA  in the last 168 hours.  CBC: Recent Labs  Lab 09/28/17 1335  WBC 8.6  HGB 11.9*  HCT 36.2  MCV 95.8  PLT 274    Cardiac Enzymes: No results for input(s): CKTOTAL, CKMB, CKMBINDEX, TROPONINI in the last 168 hours.  BNP: Invalid input(s): POCBNP  CBG: No results for input(s): GLUCAP in the last 168 hours.  Microbiology: Results for orders placed or performed during the hospital encounter of 09/01/17  Urine culture     Status: Abnormal   Collection Time: 09/01/17  4:32 PM  Result Value Ref Range Status   Specimen Description   Final    URINE, RANDOM Performed at [facility], [address] [place], [place] [address]    Special Requests   Final    NONE Performed at [facility], [address] [place], [place] [address]    Culture MULTIPLE SPECIES PRESENT, SUGGEST RECOLLECTION (A)  Final   Report Status 09/03/2017 FINAL  Final  Culture, blood (routine x 2)     Status: None   Collection Time: 09/01/17  6:05 PM  Result Value Ref Range Status   Specimen Description BLOOD RIGHT HAND  Final   Special Requests   Final    BOTTLES DRAWN AEROBIC AND ANAEROBIC Blood Culture results may not be optimal due to an excessive volume of blood received in culture bottles   Culture   Final    NO GROWTH 5 DAYS Performed at [facility], [address] [place], [place] [address]    Report Status 09/06/2017 FINAL  Final  Culture, blood (routine x 2)     Status: None   Collection Time: 09/01/17  6:34 PM  Result Value Ref Range Status   Specimen Description BLOOD RAC  Final   Special Requests   Final    BOTTLES DRAWN AEROBIC AND ANAEROBIC Blood Culture adequate volume   Culture   Final    NO GROWTH 5 DAYS Performed at [facility], [address] [place],  [address]    Report Status 09/06/2017 FINAL  Final    Coagulation Studies: No results for input(s): LABPROT, INR in the last 72 hours.  Urinalysis: No results for input(s):  COLORURINE, LABSPEC, PHURINE, GLUCOSEU, HGBUR, BILIRUBINUR, KETONESUR, PROTEINUR, UROBILINOGEN, NITRITE, LEUKOCYTESUR in the last 168 hours.  Invalid input(s):  APPERANCEUR  Lipid Panel:     Component Value Date/Time   CHOL 251 (H) 09/18/2017 0646   CHOL 105 05/14/2014 0412   TRIG 124 09/18/2017 0646   TRIG 127 05/14/2014 0412   HDL 66 09/18/2017 0646   HDL 37 (L) 05/14/2014 0412   CHOLHDL 3.8 09/18/2017 0646   VLDL 25 09/18/2017 0646   VLDL 25 05/14/2014 0412   LDLCALC 160 (H) 09/18/2017 0646   LDLCALC 43 05/14/2014 0412    HgbA1C:  Lab Results  Component Value Date   HGBA1C 4.8 09/18/2017    Urine Drug Screen:      Component Value Date/Time   LABOPIA NONE DETECTED 09/01/2017 1632   COCAINSCRNUR NONE DETECTED 09/01/2017 1632   LABBENZ NONE DETECTED 09/01/2017 1632   AMPHETMU NONE DETECTED 09/01/2017 1632   THCU NONE DETECTED 09/01/2017 1632   LABBARB NONE DETECTED 09/01/2017 1632    Alcohol Level: No results for input(s): ETH in the last 168 hours.  Other results: EKG: normal sinus rhythm at 60 bpm.  Imaging: No results found.   Assessment/Plan: 72 year old female with a history of BPD, admitted with psychosis and mania.  Despite attempts at treatment patient continues to decline.  MRI of the brain performed on 09/02/17 for AMS that was unremarkable.  Head CT 3/5 reviewed and continues to show no abnormalities.  Patient unable to cooperate for repeat MR imaging.  Unable to do a sufficient neurological examination due to cooperation which makes it difficult to fully evaluate and develop an adequate differential but psychiatric disease remains high on the differential.  Patient not eating or drinking.  May consider discontinuing psychoactive medications so that baseline can be evaluated and response determined which may help in determining diagnosis.  Case discussed with Dr. Margorie John, MD Neurology [phone] 09/30/2017, 2:23 PM

## 2017-09-30 NOTE — Progress Notes (Signed)
Nursing 1:1 note D:Pt observed lying in bed with eyes closed. RR even and unlabored. No distress noted. A: 1:1 observation continues for safety  R: pt remains safe  

## 2017-09-30 NOTE — Progress Notes (Signed)
Patient transported to dialysis via wheelchair by sitter and with this Probation officer.

## 2017-09-30 NOTE — [facility] Group Notes (Signed)
[facility] Notes:  (Nursing/MHT/Case Management/Adjunct)  Date:  09/30/2017  Time:  3:41 PM  Type of Therapy:  Psychoeducational Skills  Participation Level:  Did Not Attend  Adela Lank [facility] 09/30/2017, 3:41 PM

## 2017-09-30 NOTE — Progress Notes (Signed)
Patient continues to be on 1:1 , sitter at bedside, patient wasencouraged to drink fluids, awake no distress noted.

## 2017-09-30 NOTE — Progress Notes (Signed)
Patient ID: Joyce [name], female   DOB: 09-Feb-1946, 72 y.o.   MRN: [id number]  Mr. Hakala seems more confused today. She is paranoid and hallucinating. She did not allow dialysis today. She does not have the capacity to make such decision. Dialysis are supported by her sister, HCPOA, and son.  Spoke with Drs. Reynolds, [employer], [employer], Psychiatric nurse and [place]. Dr. Leslye Peer ordered palliative care consult.  Spoke with her sister who is HCPOA and will visit tonight and her son and daughter in law.

## 2017-09-30 NOTE — Progress Notes (Signed)
Patient alert and oriented x 3, affect is flat and sad  no distress noted, skin warm and intact.  Patient was encouraged to eat and drink,sitter at bedside

## 2017-09-30 NOTE — Progress Notes (Signed)
Patient is confused and paranoid.  Refuses to take any medication.  Refused breakfast and lunch. Ate 2 bites of cream potatoes at dinner. Took one sip of tea.

## 2017-09-30 NOTE — Progress Notes (Signed)
Patient continues to be on 1:1 no distress noted.  

## 2017-09-30 NOTE — Progress Notes (Signed)
Continues to be on 1:1 no distress noted.

## 2017-09-30 NOTE — Progress Notes (Signed)
Nursing 1:1 note D:Pt observed lying in bed. RR even and unlabored. No distress noted. Pt given her nighttime vitamin and her Zyprexa she refused earlier A: 1:1 observation continues for safety  R: pt remains safe

## 2017-09-30 NOTE — Progress Notes (Signed)
[facility] MD Progress Note  09/30/2017 11:08 AM Joyce [name]  MRN:  [id number]  Subjective:   [name] took dialysis yesterday with consent signed by her sister who is HCPOA. She was restless, pulling her acces. She felt better in the middle of the day and met with her son briefly in day rom. He recognized her son but was unable to participate in a meaningful conversation. She did not sleep at all last night in spite of dose of Zyprexa and even Restoril 15 mg given PRN. This morning, she is actively hallucinating, seeing people in the bathroom, listening to the voices. She is paranoid, frightened. She refuses to put her pants on. She did not eat at all and had only few sips of orange juice. She was unable to stand on her own. Sitter at bedside.  I met with the family several times yesterday to address all their questions. There is consensus that the patient should continue in dialysis   Treatment plan. The patient is scheduled for dialysis today to return to her regula MWF schedule. She does not have the capacity to consent. We will try to continue dialysis with her sister's consent. We continue Tegretol for mood stabilization and Zyprexa for psychosis. Awaiting neurology consult.  Per family report, the patient has been stable on a combination of seroquel and Tegretol for years. When she started dialysis in November 2018, she became somewhat depressed but also confused at times. There were worries about her ability to drive. The family and the patient were unsure if she were taking her medications correctly. Abilify was added to her regimen by Dr. Thurmond Butts, her primary psychiatrist. She was briefly hospitalized on medical floor 2/12-2/14 for encephalopathy. She returned to the hospital on 2/28 floridly psychotic. We initially continue Tegretol, Seroquel and Abilify as poor treatment compliance was thought to be at fault. When the patient started experiencing hypotonia and patient started receiving Midrin, we  discontinued Seroquel and started Haldol 1 mg TID but she seemed restless from Haldol. We tried to restart Lithium that was helpful in the past. It had to be stopped when she refused dialysis on Monday. All along we continued Tegretol. She is now on Zyprexa. Insomnia and extremely poor appetite are of grave concern.  Social/disposition. The patient lives independently. Before this admission, there were concerns about her safety. She has follen and was getting lost while driving a car. She was unable to administer medications. The family is supportive but conflicted. The family spoke with financial aid specialist yesterday.   Principal Problem: Bipolar affective disorder, current episode manic with psychotic symptoms ([place]) Diagnosis:   Patient Active Problem List   Diagnosis Date Noted  . Bipolar affective disorder, current episode manic with psychotic symptoms ([place]) [F31.2] 09/17/2017    Priority: High  . Bipolar I disorder, most recent episode (or current) manic ([place]) [F31.10] 09/02/2017  . Acute delirium [R41.0] 09/02/2017  . Altered mental status [R41.82]   . Acute encephalopathy [G93.40] 09/01/2017  . ESRD on dialysis ([place]) [N18.6, Z99.2] 04/06/2017  . Complication of vascular access for dialysis [T82.9XXA] 04/06/2017  . Hematuria [R31.9] 12/18/2016  . Polycystic kidney [Q61.3] 10/12/2016  . Ruptured cyst of kidney [Q61.00] 10/03/2016  . HTN (hypertension) [I10] 10/01/2016  . Chronic kidney disease [N18.9] 10/01/2016  . Anemia associated with chronic renal failure [D63.1] 09/24/2016  . Acute kidney insufficiency [N28.9] 07/28/2016  . Noninfectious diarrhea [K52.9]   . Benign neoplasm of cecum [D12.0]   . Benign neoplasm of ascending colon [  D12.2]   . Diarrhea [R19.7]   . Nausea [R11.0]   . Gastric polyp [K31.7]   . Chronic constipation [K59.09] 12/27/2014  . Nausea with vomiting [R11.2] 12/27/2014  . Left sided abdominal pain [R10.9] 12/27/2014  . Personal history of colonic  polyps [Z86.010] 05/10/2013   Total Time spent with patient: 30 minutes  Past Psychiatric History: bipolar disorder  Past Medical History:  Past Medical History:  Diagnosis Date  . Anal fissure   . Bipolar affective disorder ([place])   . CKD (chronic kidney disease)    Dr Holley Raring [name] 4  . Colon polyps   . Diverticulitis   . Diverticulitis   . Family history of adverse reaction to anesthesia    mom - PONV  . GERD (gastroesophageal reflux disease)   . Headache    migraines - none over 10 yrs  . Heart murmur   . History of hiatal hernia   . Hypertension   . Pancreatitis    Valproic acid  . Vertigo     Past Surgical History:  Procedure Laterality Date  . A/V FISTULAGRAM Left 04/14/2017   Procedure: A/V Fistulagram;  Surgeon: Katha Cabal, MD;  Location: [place] [facility];  Service: Cardiovascular;  Laterality: Left;  . A/V FISTULAGRAM Left 06/09/2017   Procedure: A/V FISTULAGRAM;  Surgeon: Katha Cabal, MD;  Location: [place] [facility];  Service: Cardiovascular;  Laterality: Left;  . ABDOMINAL HYSTERECTOMY  1990 ?  . AV FISTULA PLACEMENT  4/30  . BREAST EXCISIONAL BIOPSY Left 1994   neg surgical bx  . CHOLECYSTECTOMY  2003  . COLONOSCOPY  2014   Dr. Jamal Collin  . COLONOSCOPY WITH PROPOFOL N/A 09/24/2015   Procedure: COLONOSCOPY WITH random colon byopies.;  Surgeon: Lucilla Lame, MD;  Location: [place];  Service: Endoscopy;  Laterality: N/A;  . ESOPHAGOGASTRODUODENOSCOPY (EGD) WITH PROPOFOL N/A 09/24/2015   Procedure: ESOPHAGOGASTRODUODENOSCOPY (EGD) ;  Surgeon: Lucilla Lame, MD;  Location: [place];  Service: Endoscopy;  Laterality: N/A;  . EYE SURGERY    . PERIPHERAL VASCULAR CATHETERIZATION N/A 05/29/2015   Procedure: A/V Shuntogram/Fistulagram;  Surgeon: Katha Cabal, MD;  Location: [place] [facility];  Service: Cardiovascular;  Laterality: N/A;  . PERIPHERAL VASCULAR CATHETERIZATION N/A 05/29/2015   Procedure: A/V Shunt Intervention;   Surgeon: Katha Cabal, MD;  Location: [place] [facility];  Service: Cardiovascular;  Laterality: N/A;  . POLYPECTOMY  09/24/2015   Procedure: POLYPECTOMY INTESTINAL;  Surgeon: Lucilla Lame, MD;  Location: [place];  Service: Endoscopy;;  cecal polyp ascending polyp   Family History:  Family History  Problem Relation Age of Onset  . Stroke Father   . Hypertension Father   . Breast cancer Other   . Colon cancer Neg Hx   . Liver disease Neg Hx    Family Psychiatric  History: none reported Social History:  Social History   Substance and Sexual Activity  Alcohol Use No  . Alcohol/week: 0.0 oz     Social History   Substance and Sexual Activity  Drug Use No    Social History   Socioeconomic History  . Marital status: Single    Spouse name: None  . Number of children: 1  . Years of education: None  . Highest education level: None  Social Needs  . Financial resource strain: None  . Food insecurity - worry: None  . Food insecurity - inability: None  . Transportation needs - medical: None  . Transportation needs -  non-medical: None  Occupational History  . Occupation: Surveyor, quantity: [employer]  Tobacco Use  . Smoking status: Never Smoker  . Smokeless tobacco: Never Used  Substance and Sexual Activity  . Alcohol use: No    Alcohol/week: 0.0 oz  . Drug use: No  . Sexual activity: Not Currently  Other Topics Concern  . 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   Additional Social History:    History of alcohol / drug use?: No history of alcohol / drug abuse                    Sleep: Poor  Appetite:  Poor  Current Medications: Current Facility-Administered Medications  Medication Dose Route Frequency Provider Last Rate Last Dose  . acetaminophen (TYLENOL) tablet 325 mg  325 mg Oral BID PRN Clapacs, [name], MD   325 mg at 09/27/17 1225  . alum & mag  hydroxide-simeth (MAALOX/MYLANTA) 200-200-20 MG/5ML suspension 30 mL  30 mL Oral Q4H PRN Clapacs, [name], MD      . bisacodyl (DULCOLAX) EC tablet 5 mg  5 mg Oral Daily PRN Clapacs, [name], MD      . brimonidine (ALPHAGAN) 0.2 % ophthalmic solution 1 drop  1 drop Both Eyes BID Clapacs, [name], MD   1 drop at 09/29/17 0850  . carbamazepine (TEGRETOL) tablet 200 mg  200 mg Oral BID Clapacs, [name], MD   200 mg at 09/29/17 0851  . cholecalciferol (VITAMIN D) tablet 1,000 Units  1,000 Units Oral Daily Clapacs, [name], MD   1,000 Units at 09/29/17 0851  . [START ON 10/17/2017] cyanocobalamin ((VITAMIN B-12)) injection 1,000 mcg  1,000 mcg Intramuscular Q30 days Clapacs, [name], MD      . dicyclomine (BENTYL) tablet 20 mg  20 mg Oral TID AC Clapacs, [name], MD   20 mg at 09/29/17 0851  . feeding supplement (ENSURE ENLIVE) (ENSURE ENLIVE) liquid 237 mL  237 mL Oral BID BM ,  B, MD   237 mL at 09/27/17 1711  . lidocaine-prilocaine (EMLA) cream 1 application  1 application Topical Q M,W,F Clapacs, [name], MD      . magnesium hydroxide (MILK OF MAGNESIA) suspension 30 mL  30 mL Oral Daily PRN Clapacs, [name], MD      . midodrine (PROAMATINE) tablet 10 mg  10 mg Oral TID WC [name], MD   10 mg at 09/29/17 0851  . multivitamin (RENA-VIT) tablet 1 tablet  1 tablet Oral QHS ,  B, MD   1 tablet at 09/29/17 2150  . OLANZapine zydis (ZYPREXA) disintegrating tablet 20 mg  20 mg Oral QHS ,  B, MD   20 mg at 09/29/17 2151  . pantoprazole (PROTONIX) EC tablet 40 mg  40 mg Oral Daily Clapacs, [name], MD   40 mg at 09/29/17 0851  . temazepam (RESTORIL) capsule 15 mg  15 mg Oral QHS PRN ,  B, MD   15 mg at 09/29/17 2150  . timolol (TIMOPTIC) 0.5 % ophthalmic solution 1 drop  1 drop Both Eyes BID Clapacs, [name], MD   1 drop at 09/29/17 7628    Lab Results:  Results for orders placed or performed during the hospital encounter of 09/17/17 (from the  past 48 hour(s))  Renal function panel     Status: Abnormal   Collection Time:  09/28/17  1:35 PM  Result Value Ref Range   Sodium 138 135 - 145 mmol/L   Potassium 4.0 3.5 - 5.1 mmol/L   Chloride 96 (L) 101 - 111 mmol/L   CO2 27 22 - 32 mmol/L   Glucose, Bld 108 (H) 65 - 99 mg/dL   BUN 54 (H) 6 - 20 mg/dL   Creatinine, Ser 7.19 (H) 0.44 - 1.00 mg/dL   Calcium 9.6 8.9 - 10.3 mg/dL   Phosphorus 3.1 2.5 - 4.6 mg/dL   Albumin 3.9 3.5 - 5.0 g/dL   GFR calc non Af Amer 5 (L) >60 mL/min   GFR calc Af Amer 6 (L) >60 mL/min    Comment: (NOTE) The eGFR has been calculated using the CKD EPI equation. This calculation has not been validated in all clinical situations. eGFR's persistently <60 mL/min signify possible Chronic Kidney Disease.    Anion gap 15 5 - 15    Comment: Performed at [facility], [address] [place], [place] [address]  CBC     Status: Abnormal   Collection Time: 09/28/17  1:35 PM  Result Value Ref Range   WBC 8.6 3.6 - 11.0 K/uL   RBC 3.77 (L) 3.80 - 5.20 MIL/uL   Hemoglobin 11.9 (L) 12.0 - 16.0 g/dL   HCT 36.2 35.0 - 47.0 %   MCV 95.8 80.0 - 100.0 fL   MCH 31.6 26.0 - 34.0 pg   MCHC 32.9 32.0 - 36.0 g/dL   RDW 14.3 11.5 - 14.5 %   Platelets 274 150 - 440 K/uL    Comment: Performed at [facility], [address] [place], [place] [address]    Blood Alcohol level:  Lab Results  Component Value Date   [facility] <10 21/19/4174    Metabolic Disorder Labs: Lab Results  Component Value Date   HGBA1C 4.8 09/18/2017   MPG 91.06 09/18/2017   No results found for: PROLACTIN Lab Results  Component Value Date   CHOL 251 (H) 09/18/2017   TRIG 124 09/18/2017   HDL 66 09/18/2017   CHOLHDL 3.8 09/18/2017   VLDL 25 09/18/2017   LDLCALC 160 (H) 09/18/2017   LDLCALC 43 05/14/2014    Physical Findings: AIMS: Facial and Oral Movements Muscles of Facial Expression: None, normal Lips and Perioral Area: None, normal Jaw: None, normal Tongue: None,  normal,Extremity Movements Upper (arms, wrists, hands, fingers): None, normal Lower (legs, knees, ankles, toes): None, normal, Trunk Movements Neck, shoulders, hips: None, normal, Overall Severity Severity of abnormal movements (highest score from questions above): None, normal Incapacitation due to abnormal movements: None, normal Patient's awareness of abnormal movements (rate only patient's report): No Awareness, Dental Status Current problems with teeth and/or dentures?: No Does patient usually wear dentures?: No  CIWA:    COWS:     Musculoskeletal: Strength & Muscle Tone: decreased Gait & Station: unable to stand Patient leans: N/A  Psychiatric Specialty Exam: Physical Exam  Nursing note and vitals reviewed. Psychiatric: Her affect is inappropriate. Her speech is delayed. She is slowed and actively hallucinating. Thought content is paranoid. Cognition and memory are impaired. She expresses inappropriate judgment.    Review of Systems  Cardiovascular: Positive for chest pain.  Neurological: Positive for weakness.  Psychiatric/Behavioral: Positive for depression and hallucinations. The patient has insomnia.   All other systems reviewed and are negative.   Blood pressure (!) 161/68, pulse (!) 113, temperature 98.9 F (37.2 C), temperature source Oral, resp. rate 18, height 5' 3" (1.6 m),  weight 72.6 kg (160 lb), SpO2 99 %.Body mass index is 28.34 kg/m.  General Appearance: Disheveled  Eye Contact:  Good  Speech:  Slow and Slurred  Volume:  Decreased  Mood:  Anxious  Affect:  Flat  Thought Process:  Disorganized and Descriptions of Associations: Loose  Orientation:  Other:  person and place  Thought Content:  Illogical, Delusions, Hallucinations: Auditory Visual and Paranoid Ideation  Suicidal Thoughts:  No  Homicidal Thoughts:  No  Memory:  Immediate;   Poor Recent;   Poor Remote;   Poor  Judgement:  Poor  Insight:  Lacking  Psychomotor Activity:  Decreased   Concentration:  Concentration: Poor and Attention Span: Poor  Recall:  Poor  Fund of Knowledge:  Poor  Language:  Poor  Akathisia:  No  Handed:  Right  AIMS (if indicated):     Assets:  Communication Skills Desire for Improvement Financial Resources/Insurance Housing Social Support  ADL's:  Intact  Cognition:  WNL  Sleep:  Number of Hours: 6.75     Treatment Plan Summary: Daily contact with patient to assess and evaluate symptoms and progress in treatment and Medication management   Ms. Sok is a 72 year old female with a history of bipolar disorder and ESRD admitted for psychotic break.  #Mood/psychosis -we discontinued Abilify and Seroquel that were prescribed in the community -we discontinued Haldol, Remeron and Lithium given briefly over the weekend -continue Zyprexa 20 mg nightly   -continue Tegretol 200 mg BID, Tegretol level 11 -consider ECT  #AMS -neurology consult  #Autonomic instability -Midrin  -medicine consult is greatly appreciated  #Poor oral intake -Ensure TID -MVI  #UTI -completed course of Keflex  #Dialyses -nephrology input is greatly appreciated -patient will continue with dialysis -Midrin before each treatment  #GERD -Protonix 40 mg daily  #Glaucoma -continue eye drops  #Vitamin deficiency -continue B12 and D -vit B12 [id number]  #Labs, metabolic syndrome monitoring -lipid panel, TSH and HgbA1C are unremarkable -EKG, QTc 440  #Admission status -voluntary admission  #Disposition -discharge to home -follow up with Dr. Bryna Colander, MD 09/30/2017, 11:08 AM

## 2017-09-30 NOTE — Progress Notes (Signed)
Hourly rounding  0800 Lying in bed with eyes open. Restless, cannot be still.  Sitter at bedside 0900 Resting ing bed.  Sitter at bedside.   1000 Resting ing bed.  Sitter at bedside. 1100 Resting ing bed.  Sitter at bedside. 1200 Laying in bed quietly. Sitter at beside 1300 Giving patient medication in preparation to go to dialysis.  Sitter at bedside 1400 In bed.  Restless. Sitter at bedside. 1500 In bed.  Restless. Sitter at bedside. 1600 Resting in bed with eyes closed.  Sitter at bedside.   1700 In bed.  Sitter at bedside. 1800 Laying in bed. Eyes darting around room as if looking for something. Sitter at bedside 1900 Visiting with sister in her room.  Sitter at bedside.

## 2017-09-30 NOTE — Progress Notes (Signed)
Nursing 1:1 note D:Pt observed LYING in bed with eyes closed. RR even and unlabored. No distress noted. Pt continues to present confused at times.  A: 1:1 observation continues for safety  R: pt remains safe

## 2017-09-30 NOTE — Progress Notes (Signed)
[facility]  ROUNDING NOTE   Subjective:   Patient unable to get hemodialysis today. Refusing treatment and withdrawing her access arm.   Objective:  Vital signs in last 24 hours:  Pulse Rate:  [113] 113 (03/13 0641) Resp:  [18] 18 (03/13 0641) BP: (161)/(68) 161/68 (03/13 0641)  Weight change:  Filed Weights    Intake/Output: I/O last 3 completed shifts: In: 0  Out: -182    Intake/Output this shift:  No intake/output data recorded.  Physical Exam: General: NAD, laying in bed  Head: Normocephalic, atraumatic. Moist oral mucosal membranes  Eyes: Anicteric  Neck: Supple, trachea midline  Lungs:  Clear to auscultation  Heart: Regular rate and rhythm  Abdomen:  Soft, nontender  Extremities: no peripheral edema.  Psych:  Flat affect. Slow to respond  Neurologic: Nonfocal, moving all four extremities  Skin: No lesions  Access: Left AVF    Basic Metabolic Panel: Recent Labs  Lab 09/25/17 1043 09/28/17 1335  NA 138 138  K 3.0* 4.0  CL 96* 96*  CO2 26 27  GLUCOSE 170* 108*  BUN 40* 54*  CREATININE 6.61* 7.19*  CALCIUM 9.3 9.6  PHOS  --  3.1    Liver Function Tests: Recent Labs  Lab 09/28/17 1335  ALBUMIN 3.9   No results for input(s): LIPASE, AMYLASE in the last 168 hours. No results for input(s): AMMONIA in the last 168 hours.  CBC: Recent Labs  Lab 09/28/17 1335  WBC 8.6  HGB 11.9*  HCT 36.2  MCV 95.8  PLT 274    Cardiac Enzymes: No results for input(s): CKTOTAL, CKMB, CKMBINDEX, TROPONINI in the last 168 hours.  BNP: Invalid input(s): POCBNP  CBG: No results for input(s): GLUCAP in the last 168 hours.  Microbiology: Results for orders placed or performed during the hospital encounter of 09/01/17  Urine culture     Status: Abnormal   Collection Time: 09/01/17  4:32 PM  Result Value Ref Range Status   Specimen Description   Final    URINE, RANDOM Performed at [facility], [address], [place], [place]  [address]    Special Requests   Final    NONE Performed at [facility], [address] [place], [place] [address]    Culture MULTIPLE SPECIES PRESENT, SUGGEST RECOLLECTION (A)  Final   Report Status 09/03/2017 FINAL  Final  Culture, blood (routine x 2)     Status: None   Collection Time: 09/01/17  6:05 PM  Result Value Ref Range Status   Specimen Description BLOOD RIGHT HAND  Final   Special Requests   Final    BOTTLES DRAWN AEROBIC AND ANAEROBIC Blood Culture results may not be optimal due to an excessive volume of blood received in culture bottles   Culture   Final    NO GROWTH 5 DAYS Performed at [facility], [address], [place], [place] [address]    Report Status 09/06/2017 FINAL  Final  Culture, blood (routine x 2)     Status: None   Collection Time: 09/01/17  6:34 PM  Result Value Ref Range Status   Specimen Description BLOOD RAC  Final   Special Requests   Final    BOTTLES DRAWN AEROBIC AND ANAEROBIC Blood Culture adequate volume   Culture   Final    NO GROWTH 5 DAYS Performed at [facility], [address] [place], [place] [address]    Report Status 09/06/2017 FINAL  Final    Coagulation  Studies: No results for input(s): LABPROT, INR in the last 72 hours.  Urinalysis: No results for input(s): COLORURINE, LABSPEC, PHURINE, GLUCOSEU, HGBUR, BILIRUBINUR, KETONESUR, PROTEINUR, UROBILINOGEN, NITRITE, LEUKOCYTESUR in the last 72 hours.  Invalid input(s): APPERANCEUR    Imaging: No results found.   Medications:    . brimonidine  1 drop Both Eyes BID  . carbamazepine  200 mg Oral BID  . [START ON 10/17/2017] cyanocobalamin  1,000 mcg Intramuscular Q30 days  . dicyclomine  20 mg Oral TID AC  . feeding supplement (ENSURE ENLIVE)  237 mL Oral BID BM  . lidocaine-prilocaine  1 application Topical Q M,W,F  . midodrine  10 mg Oral TID WC  . multivitamin  1 tablet Oral QHS  . OLANZapine zydis  20 mg Oral QHS  . pantoprazole  40 mg  Oral Daily  . timolol  1 drop Both Eyes BID   acetaminophen, alum & mag hydroxide-simeth, bisacodyl, magnesium hydroxide, temazepam  Assessment/ Plan:  Ms. Kambria Grima is a 72 y.o. white female with end stage renal disease on hemodialysis, diverticulosis, hypotension, bipolar disorder, polycystic kidney disease   [facility]/MWF  1.  ESRD on HD MWF. Hemodialysis treatment yesterday. Tolerated treatment. Refused treatment today.  Monitor daily for dialysis need  2.  Anemia of CKD: hemoglobin 11.9 - Holding EPO  3.  Secondary hyperparathyroidism: Phosphorus and calcium at goal. PTH 150.  Not currently on binders.   4. Hypotension:  continue midodrine prior to dialysis.   5. Delirium: doubtful this is uremic encephalopathy as BUN is low and patient has been getting some dialysis.  - Check labs daily - Check ammonia - Appreciate Psych input.   Discussed case with Dr. Bary Leriche, Dr. Leslye Peer and Dr. Thurmond Butts.    LOS: [place] 3/13/20194:04 PM

## 2017-09-30 NOTE — Progress Notes (Signed)
Condition unchanged no distress noted, patient awake sitter at bedside.

## 2017-09-30 NOTE — Progress Notes (Signed)
Patient alert and oriented condition unchanged no distress noted, sitter at bedside.

## 2017-09-30 NOTE — Plan of Care (Signed)
  Progressing Elimination: Will not experience complications related to bowel motility 09/30/2017 1135 - Progressing by Rise Mu, RN Will not experience complications related to urinary retention 09/30/2017 1135 - Progressing by Rise Mu, RN Pain Managment: General experience of comfort will improve 09/30/2017 1135 - Progressing by Rise Mu, RN   Not Progressing Education: Will be free of psychotic symptoms 09/30/2017 1135 - Not Progressing by Rise Mu, RN Note Patient is confused Coping: Ability to cope will improve 09/30/2017 1135 - Not Progressing by Rise Mu, RN Ability to verbalize feelings will improve 09/30/2017 1135 - Not Progressing by Rise Mu, RN Note Elective mutism Health Behavior/Discharge Planning: Compliance with prescribed medication regimen will improve 09/30/2017 1135 - Not Progressing by Rise Mu, RN Note Refusing medications.  When attempted to give patient eye drops patient squeezed eyes shut, turned her head and swatted this writers hands away.  Refused to take any of her po medications Nutritional: Ability to achieve adequate nutritional intake will improve 09/30/2017 1135 - Not Progressing by Rise Mu, RN Note Patient refuses to eat or drink her ensure Safety: Ability to redirect hostility and anger into socially appropriate behaviors will improve 09/30/2017 1135 - Not Progressing by Rise Mu, RN Self-Concept: Ability to verbalize positive feelings about self will improve 09/30/2017 1135 - Not Progressing by Rise Mu, RN Coping: Development of coping mechanisms to deal with changes in body function or appearance will improve 09/30/2017 1135 - Not Progressing by Rise Mu, RN Fluid Volume: Compliance with measures to maintain balanced fluid volume will improve 09/30/2017 1135 - Not Progressing by Rise Mu, RN Note Reluctant to take anything po on.  Can only get patient to take  small sips of fluid

## 2017-09-30 NOTE — Progress Notes (Signed)
No distress noted, resting in bed with eyes open, no distress noted will continue to monitor.

## 2017-09-30 NOTE — Consult Note (Signed)
[facility] [place] NAME: Joyce [name]    MR#:  [id number]  DATE OF BIRTH:  04-14-1946  DATE OF ADMISSION:  09/17/2017  PRIMARY CARE PHYSICIAN: Cletis Athens, MD   REQUESTING/REFERRING PHYSICIAN: Dr Bary Leriche  CHIEF COMPLAINT:  Not eating, refused dialysis today  HISTORY OF PRESENT ILLNESS:  Cheyla Duchemin  is a 72 y.o. female has been on the psych floor for psychosis. She was supposed to have ECT treatment today but anesthesia canceled because of her confusion.  The patient eats very little.  The patient refused dialysis today.  The patient is not the best historian.  History obtained from old chart.  I was able to speak with the sister on the phone and she will come in this evening and try to feed her a snack.  I explained to her that her overall prognosis is poor if she does not eat and she refuses dialysis.  She may have to be present with each dialysis session in order to make sure that she gets it.  The patient's sister thinks that she has given up and wants to die.  I explained I ordered a palliative care consultation.  PAST MEDICAL HISTORY:   Past Medical History:  Diagnosis Date  . Anal fissure   . Bipolar affective disorder ([place])   . CKD (chronic kidney disease)    Dr Holley Raring [name] 4  . Colon polyps   . Diverticulitis   . Diverticulitis   . Family history of adverse reaction to anesthesia    mom - PONV  . GERD (gastroesophageal reflux disease)   . Headache    migraines - none over 10 yrs  . Heart murmur   . History of hiatal hernia   . Hypertension   . Pancreatitis    Valproic acid  . Vertigo     PAST SURGICAL HISTOIRY:   Past Surgical History:  Procedure Laterality Date  . A/V FISTULAGRAM Left 04/14/2017   Procedure: A/V Fistulagram;  Surgeon: Katha Cabal, MD;  Location: [facility];  Service: Cardiovascular;  Laterality: Left;  . A/V FISTULAGRAM Left 06/09/2017   Procedure: A/V FISTULAGRAM;  Surgeon:  Katha Cabal, MD;  Location: [facility];  Service: Cardiovascular;  Laterality: Left;  . ABDOMINAL HYSTERECTOMY  1990 ?  . AV FISTULA PLACEMENT  4/30  . BREAST EXCISIONAL BIOPSY Left 1994   neg surgical bx  . CHOLECYSTECTOMY  2003  . COLONOSCOPY  2014   Dr. Jamal Collin  . COLONOSCOPY WITH PROPOFOL N/A 09/24/2015   Procedure: COLONOSCOPY WITH random colon byopies.;  Surgeon: Lucilla Lame, MD;  Location: [facility];  Service: Endoscopy;  Laterality: N/A;  . ESOPHAGOGASTRODUODENOSCOPY (EGD) WITH PROPOFOL N/A 09/24/2015   Procedure: ESOPHAGOGASTRODUODENOSCOPY (EGD) ;  Surgeon: Lucilla Lame, MD;  Location: [place] [facility];  Service: Endoscopy;  Laterality: N/A;  . EYE SURGERY    . PERIPHERAL VASCULAR CATHETERIZATION N/A 05/29/2015   Procedure: A/V Shuntogram/Fistulagram;  Surgeon: Katha Cabal, MD;  Location: [facility];  Service: Cardiovascular;  Laterality: N/A;  . PERIPHERAL VASCULAR CATHETERIZATION N/A 05/29/2015   Procedure: A/V Shunt Intervention;  Surgeon: Katha Cabal, MD;  Location: [facility];  Service: Cardiovascular;  Laterality: N/A;  . POLYPECTOMY  09/24/2015   Procedure: POLYPECTOMY INTESTINAL;  Surgeon: Lucilla Lame, MD;  Location: [facility];  Service: Endoscopy;;  cecal polyp ascending polyp    SOCIAL HISTORY:   Social History   Tobacco  Use  . Smoking status: Never Smoker  . Smokeless tobacco: Never Used  Substance Use Topics  . Alcohol use: No    Alcohol/week: 0.0 oz    FAMILY HISTORY:   Family History  Problem Relation Age of Onset  . Stroke Father   . Hypertension Father   . Breast cancer Other   . Colon cancer Neg Hx   . Liver disease Neg Hx     DRUG ALLERGIES:   Allergies  Allergen Reactions  . Morphine And Related Shortness Of Breath    Pt reports chest pain and difficulty breathing.  . Indomethacin Hives  . Pollen Extract Other (See Comments)    Sinus problems and HA    REVIEW OF SYSTEMS:   Unable to provide secondary to psychiatric issues  MEDICATIONS AT HOME:   Prior to Admission medications   Medication Sig Start Date End Date Taking? Authorizing Provider  acetaminophen (TYLENOL) 325 MG tablet Take 325 mg 2 (two) times daily as needed by mouth for moderate pain or headache.     [provider]  ARIPiprazole (ABILIFY) 2 MG tablet Take 2 mg by mouth daily.    [provider]  ARIPiprazole (ABILIFY) 5 MG tablet Take 1 tablet (5 mg total) by mouth daily. 09/21/17   [name], [name], MD  bisacodyl (DULCOLAX) 5 MG EC tablet Take 1 tablet (5 mg total) by mouth daily as needed for moderate constipation. Patient not taking: Reported on 04/09/2017 02/11/17   Vaughan Basta, MD  brimonidine (ALPHAGAN) 0.2 % ophthalmic solution Place 2 (two) times daily into both eyes.    [provider]  carbamazepine (TEGRETOL) 200 MG tablet Take 200 mg by mouth 2 (two) times daily. 02/13/17   [provider]  cephALEXin (KEFLEX) 500 MG capsule Take 1 capsule (500 mg total) by mouth every 12 (twelve) hours. Patient not taking: Reported on 09/16/2017 09/03/17   Demetrios Loll, MD  cholecalciferol (VITAMIN D) 1000 UNITS tablet Take 1,000 Units by mouth daily.    [provider]  cyanocobalamin (,VITAMIN B-12,) 1000 MCG/ML injection Inject 1,000 mcg into the muscle every 30 (thirty) days.    [provider]  dicyclomine (BENTYL) 20 MG tablet Take 1 tablet (20 mg total) by mouth 3 (three) times daily before meals. 09/03/17   Demetrios Loll, MD  lidocaine-prilocaine (EMLA) cream Apply 1 application every Monday, Wednesday, and Friday topically. At dialysis 05/06/17   [provider]  midodrine (PROAMATINE) 10 MG tablet Take 10 mg by mouth daily.  07/06/17   [provider]  pantoprazole (PROTONIX) 40 MG tablet TAKE 1 TABLET EVERY DAY 10/08/16   Lucilla Lame, MD  pramipexole (MIRAPEX) 0.125 MG tablet Take 0.125 mg by mouth daily at 2 PM.  03/17/17   [provider]  QUEtiapine (SEROQUEL) 25 MG tablet Take 50 mg by mouth at bedtime.  02/13/17   [provider]  timolol (TIMOPTIC) 0.5 % ophthalmic solution Place 1 drop 2 (two) times daily into both eyes.     [provider]      VITAL SIGNS:  Blood pressure (!) 161/68, pulse (!) 113, temperature 98.9 F (37.2 C), temperature source Oral, resp. rate 18, height 5\' 3"  (1.6 m), weight 72.6 kg (160 lb), SpO2 99 %.  PHYSICAL EXAMINATION:  GENERAL:  72 y.o.-year-old patient lying in the bed with no acute distress.  EYES: Pupils equal, round, reactive to light and accommodation. No scleral icterus. Extraocular muscles intact.  HEENT: Head atraumatic, normocephalic. Oropharynx and  nasopharynx clear.  NECK:  Supple, no jugular venous distention. No thyroid enlargement, no tenderness.  LUNGS: Normal breath sounds bilaterally, no wheezing, rales,rhonchi or crepitation. No use of accessory muscles of respiration.  CARDIOVASCULAR: S1, S2 normal. No murmurs, rubs, or gallops.  ABDOMEN: Soft, nontender, nondistended. Bowel sounds present. No organomegaly or mass.  EXTREMITIES: No pedal edema, cyanosis, or clubbing.  NEUROLOGIC: Cranial nerves II through XII are intact. Muscle strength 5/5 in all extremities. Sensation intact. Gait not checked.  PSYCHIATRIC: The patient is alert and oriented x 3.  SKIN: No obvious rash, lesion, or ulcer.   LABORATORY PANEL:   CBC Recent Labs  Lab 09/28/17 1335  WBC 8.6  HGB 11.9*  HCT 36.2  PLT 274   ------------------------------------------------------------------------------------------------------------------  Chemistries  Recent Labs  Lab 09/28/17 1335  NA 138  K 4.0  CL 96*  CO2 27  GLUCOSE 108*  BUN 54*  CREATININE 7.19*  CALCIUM 9.6   ------------------------------------------------------------------------------------------------------------------  Cardiac Enzymes No results for input(s): TROPONINI in  the last 168 hours. ------------------------------------------------------------------------------------------------------------------   IMPRESSION AND PLAN:   1. Failure to thrive in adult patient.  Patient does have a living well and is a DNR.  I explained to the sister that if she does not eat and she refuses dialysis she will pass away.  She will come in and encouraged her to eat this evening.  Palliative care consultation.  Sister believes that she is giving up and wants to pass away.  Calorie count.  I would not do IV fluids in a dialysis patient.  In reviewing the living well she does not want any life sustaining procedures. 2.  Orthostatic hypotension with syncope earlier in the admission.  On midodrine 3.  End-stage renal disease.  Patient refused dialysis today 4.  Bipolar disorder with paranoid component on psychiatric medications as per psychiatrist.  Hard to even communicate with the patient today. 5.  GERD on Protonix 6.  Glaucoma on eyedrops  I have reviewed prior laboratory data and radiological studies  I have spoke with the sister on the phone.  She will come in and try to encourage the patient to eat this evening.  CODE STATUS: DNR  TOTAL TIME TAKING CARE OF THIS PATIENT: 50 minutes.    [name] M.D on 09/30/2017 at 2:44 PM  Between 7am to 6pm - Pager - [phone]  After 6pm go to [email or web address] - password [email or web address]  [facility]  Office  [phone]  CC: Primary care Physician: Cletis Athens, MD

## 2017-09-30 NOTE — Progress Notes (Signed)
Patient continues to be on 1:1 no distress noted, condition unchanged.

## 2017-09-30 NOTE — Progress Notes (Signed)
Sitter at bedside no distress noted, patient is awake.

## 2017-09-30 NOTE — Progress Notes (Signed)
Nursing 1:1 note D:Pt observed sitting in bed. RR even and unlabored. No distress noted. Pt son came to visit  A: 1:1 observation continues for safety  R: pt remains safe

## 2017-09-30 NOTE — Patient Outreach (Signed)
[facility]) Care Management  09/30/2017  Billi Bright [facility] 18-Feb-[id number]   Case closure. Patient was referred to this social worker to review community resources. Patient disabled and not able to work, Sula Rumple has not responded to this social worker's outreach calls and letter mailed to her home. Patient has been hospitalized for more that 10 days.   Plan: Patient closed to [facility] CM at this time.   Sheralyn Boatman [facility] Care Management [phone]

## 2017-09-30 NOTE — Progress Notes (Signed)
Condition unchanged, continues to be on 1:1 no distress noted.

## 2017-09-30 NOTE — Progress Notes (Signed)
Patient on 1:1, sitter at bedside no distress noted, awake and compliant with VS this am will continue to monitor.

## 2017-09-30 NOTE — Progress Notes (Signed)
Patient continues to be on 1:1, no distress noted.

## 2017-09-30 NOTE — Progress Notes (Signed)
Condition unchanged, no distress noted, sitter at bedside.

## 2017-10-01 ENCOUNTER — Other Ambulatory Visit: Payer: Self-pay | Admitting: Psychiatry

## 2017-10-01 DIAGNOSIS — Z515 Encounter for palliative care: Secondary | ICD-10-CM

## 2017-10-01 DIAGNOSIS — Z992 Dependence on renal dialysis: Secondary | ICD-10-CM

## 2017-10-01 LAB — CBC
HCT: 39 % (ref 35.0–47.0)
Hemoglobin: 13.1 g/dL (ref 12.0–16.0)
MCH: 32 pg (ref 26.0–34.0)
MCHC: 33.5 g/dL (ref 32.0–36.0)
MCV: 95.4 fL (ref 80.0–100.0)
Platelets: 367 10*3/uL (ref 150–440)
RBC: 4.09 MIL/uL (ref 3.80–5.20)
RDW: 14 % (ref 11.5–14.5)
WBC: 10.7 10*3/uL (ref 3.6–11.0)

## 2017-10-01 LAB — RENAL FUNCTION PANEL
ALBUMIN: 4.2 g/dL (ref 3.5–5.0)
Anion gap: 19 — ABNORMAL HIGH (ref 5–15)
BUN: 51 mg/dL — AB (ref 6–20)
CALCIUM: 10.3 mg/dL (ref 8.9–10.3)
CO2: 23 mmol/L (ref 22–32)
Chloride: 99 mmol/L — ABNORMAL LOW (ref 101–111)
Creatinine, Ser: 7.39 mg/dL — ABNORMAL HIGH (ref 0.44–1.00)
GFR calc Af Amer: 6 mL/min — ABNORMAL LOW (ref >60)
GFR calc non Af Amer: 5 mL/min — ABNORMAL LOW (ref >60)
GLUCOSE: 117 mg/dL — AB (ref 65–99)
POTASSIUM: 4 mmol/L (ref 3.5–5.1)
Phosphorus: 4 mg/dL (ref 2.5–4.6)
SODIUM: 141 mmol/L (ref 135–145)

## 2017-10-01 LAB — AMMONIA: Ammonia: 16 umol/L (ref 9–35)

## 2017-10-01 MED ORDER — VITAMIN C 500 MG PO TABS
500.0000 mg | ORAL_TABLET | Freq: Two times a day (BID) | ORAL | Status: DC
  Administered 2017-10-02 – 2017-10-22 (×26): 500 mg via ORAL
  Filled 2017-10-01 (×44): qty 1

## 2017-10-01 MED ORDER — HALOPERIDOL LACTATE 5 MG/ML IJ SOLN
5.0000 mg | Freq: Three times a day (TID) | INTRAMUSCULAR | Status: DC | PRN
  Administered 2017-10-01: 5 mg via INTRAMUSCULAR

## 2017-10-01 MED ORDER — HALOPERIDOL LACTATE 5 MG/ML IJ SOLN
INTRAMUSCULAR | Status: AC
  Filled 2017-10-01: qty 1

## 2017-10-01 MED ORDER — HALOPERIDOL 5 MG PO TABS
5.0000 mg | ORAL_TABLET | Freq: Three times a day (TID) | ORAL | Status: DC | PRN
  Administered 2017-10-05 – 2017-10-16 (×4): 5 mg via ORAL
  Filled 2017-10-01 (×5): qty 1

## 2017-10-01 NOTE — Progress Notes (Signed)
Patient is confused.  Talking nonsensically.  Picking at things in the air.  Refuses to eat or drinks.  No po medication except for zyprexa. Haldol 5mg  given for restlessness and agitation with poor results. 1:1 sitter maintained for safety.

## 2017-10-01 NOTE — BHH Group Notes (Signed)
10/01/2017  Time: 1PM  Type of Therapy/Topic:  Group Therapy:  Balance in Life  Participation Level:  Did Not Attend  Description of Group:   This group will address the concept of balance and how it feels and looks when one is unbalanced. Patients will be encouraged to process areas in their lives that are out of balance and identify reasons for remaining unbalanced. Facilitators will guide patients in utilizing problem-solving interventions to address and correct the stressor making their life unbalanced. Understanding and applying boundaries will be explored and addressed for obtaining and maintaining a balanced life. Patients will be encouraged to explore ways to assertively make their unbalanced needs known to significant others in their lives, using other group members and facilitator for support and feedback.  Therapeutic Goals: 1. Patient will identify two or more emotions or situations they have that consume much of in their lives. 2. Patient will identify signs/triggers that life has become out of balance:  3. Patient will identify two ways to set boundaries in order to achieve balance in their lives:  4. Patient will demonstrate ability to communicate their needs through discussion and/or role plays  Summary of Patient Progress: Pt was invited to attend group but chose not to attend. CSW will continue to encourage pt to attend group throughout their admission.   Therapeutic Modalities:   Cognitive Behavioral Therapy Solution-Focused Therapy Assertiveness Training  Alden Hipp, MSW, LCSW 10/01/2017 2:08 PM

## 2017-10-01 NOTE — Plan of Care (Signed)
  Progressing Safety: Ability to remain free from injury will improve 10/01/2017 1620 - Progressing by Rise Mu, RN Note 1:1 at bedside   Not Progressing Elimination: Will not experience complications related to bowel motility 10/01/2017 1620 - Not Progressing by Rise Mu, RN Pain Managment: General experience of comfort will improve 10/01/2017 1620 - Not Progressing by Rise Mu, RN Note Restless Education: Will be free of psychotic symptoms 10/01/2017 1620 - Not Progressing by Rise Mu, RN Note Patient is confused. Staring at ceiling and pointing.  Talking to unseen person Knowledge of the prescribed therapeutic regimen will improve 10/01/2017 1620 - Not Progressing by Rise Mu, RN Coping: Ability to cope will improve 10/01/2017 1620 - Not Progressing by Rise Mu, RN Ability to verbalize feelings will improve 10/01/2017 1620 - Not Progressing by Rise Mu, RN Health Behavior/Discharge Planning: Compliance with prescribed medication regimen will improve 10/01/2017 1620 - Not Progressing by Rise Mu, RN Note Refuses all po medication.  Can get zyprexa in patient due to medication being disintegrating.   Nutritional: Ability to achieve adequate nutritional intake will improve 10/01/2017 1620 - Not Progressing by Rise Mu, RN Note Refuses to eat.  Can get  a few sips of liquids in patient  Self-Concept: Ability to verbalize positive feelings about self will improve 10/01/2017 1620 - Not Progressing by Rise Mu, RN Activity: Ability to tolerate increased activity will improve 10/01/2017 1620 - Not Progressing by Rise Mu, RN Coping: Development of coping mechanisms to deal with changes in body function or appearance will improve 10/01/2017 1620 - Not Progressing by Rise Mu, RN Fluid Volume: Compliance with measures to maintain balanced fluid volume will improve 10/01/2017 1620 - Not Progressing by  Rise Mu, RN Physical Regulation: Complications related to the disease process, condition or treatment will be avoided or minimized 10/01/2017 1620 - Not Progressing by Rise Mu, RN Activity: Sleeping patterns will improve 10/01/2017 1620 - Not Progressing by Rise Mu, RN

## 2017-10-01 NOTE — Consult Note (Signed)
  ECT: Follow-up consult for 72 year old woman with bipolar disorder currently with combined symptoms of depression psychosis and probable delirium.  Patient has been resistant to both medical and psychiatric treatment.  Condition has been declining.  She is taking almost nothing by mouth.  Has been resistant to dialysis.  Had a lengthy conversation today with Dr. Mamie Nick and we have all discussed this patient in staff meeting recently.  Patient's current medical condition is likely to inevitably worsen with a potentially fatal outcome if she does not improve her mental condition.  Mental condition has not been improving despite multiple interventions with medication.  Patient's illness, bipolar disorder, as well as delirium is potentially responsive to ECT.  I spoke with the patient's sister today, who has been confirmed as having power of attorney.  Sister has discussed ECT treatment with myself and with Dr. Thurmond Butts.  She is agreeable to the treatment going forward.  She is willing to come to the surgical suite tomorrow morning to sign consent papers.  Chart reviewed.  Last EKG was 4 days ago and was unremarkable.  Last set of labs done today and were not significantly changed from her baseline abnormalities.  Blood pressure and pulse have been stable.  I asked Dr. Mamie Nick to discontinue the patient's Tegretol to facilitate ECT treatment.  No other medications currently need to be altered.  I have left a message with anesthesia and spoken to 1 of our anesthesiologists about the case.  ECT nursing team aware of the plan.  N.p.o. order in place.  Plan is for bilateral ECT which will begin tomorrow morning March 15.

## 2017-10-01 NOTE — Progress Notes (Signed)
[facility] [place] NAME: Vaeda Westall    MR#:  [id number]  DATE OF BIRTH:  [birth year]  SUBJECTIVE:  CHIEF COMPLAINT:  No chief complaint on file.  - appears paranoid, initially didn't let me examine her - confused, refusing to eat  REVIEW OF SYSTEMS:  Review of Systems  Unable to perform ROS: Psychiatric disorder    DRUG ALLERGIES:   Allergies  Allergen Reactions  . Morphine And Related Shortness Of Breath    Pt reports chest pain and difficulty breathing.  . Indomethacin Hives  . Pollen Extract Other (See Comments)    Sinus problems and HA    VITALS:  Blood pressure (!) 149/83, pulse (!) 126, temperature 98.9 F (37.2 C), temperature source Oral, resp. rate 18, height 5\' 3"  (1.6 m), weight 72.6 kg (160 lb), SpO2 99 %.  PHYSICAL EXAMINATION:  Physical Exam  GENERAL:  72 y.o.-year-old patient lying in the bed with no acute distress.  EYES: Pupils equal, round, reactive to light and accommodation. No scleral icterus. Extraocular muscles intact.  HEENT: Head atraumatic, normocephalic. Oropharynx and nasopharynx clear. Dry mucus membranes NECK:  Supple, no jugular venous distention. No thyroid enlargement, no tenderness.  LUNGS: Normal breath sounds bilaterally, no wheezing, rales,rhonchi or crepitation. No use of accessory muscles of respiration. Decreased bibasilar breath sounds CARDIOVASCULAR: S1, S2 normal. No murmurs, rubs, or gallops.  ABDOMEN: Soft, nontender, nondistended. Bowel sounds present. No organomegaly or mass.  EXTREMITIES: No pedal edema, cyanosis, or clubbing.  NEUROLOGIC: Cranial nerves II through XII are intact.  No focal motor or sensory deficits. Gait not checked.  PSYCHIATRIC: The patient is alert, not following commands, very confused. paranoid SKIN: No obvious rash, lesion, or ulcer.    LABORATORY PANEL:   CBC Recent Labs  Lab 10/01/17 0711  WBC 10.7  HGB 13.1  HCT 39.0  PLT 367    ------------------------------------------------------------------------------------------------------------------  Chemistries  Recent Labs  Lab 10/01/17 0711  NA 141  K 4.0  CL 99*  CO2 23  GLUCOSE 117*  BUN 51*  CREATININE 7.39*  CALCIUM 10.3   ------------------------------------------------------------------------------------------------------------------  Cardiac Enzymes No results for input(s): TROPONINI in the last 168 hours. ------------------------------------------------------------------------------------------------------------------  RADIOLOGY:  No results found.  EKG:   Orders placed or performed during the hospital encounter of 09/17/17  . EKG 12-Lead  . EKG 12-Lead  . EKG 12-Lead  . EKG 12-Lead    ASSESSMENT AND PLAN:   72 y/o female with PMH significant for bipolar, schizophrenia, ESRD on dialysis admitted for psychotic symptoms  1. Acute encephalopathy- likely psychosis from underlying psych disorder - agree with ECT - continue psych meds- on zyprexa - appreciate neurology consult- MRI last month without any acute findings  2. ESRD- nephrology following, patient refusing dialysis  3. Failure to thrive- palliative care consulted  4. Orthostatic hypotension- needs fluids- if can take orally. Continue midodrine  5. GERD- protonix  Has a sitter at bedside   All the records are reviewed and case discussed with Care Management/Social Workerr. Management plans discussed with the patient, family and they are in agreement.  CODE STATUS: DNR  TOTAL TIME TAKING CARE OF THIS PATIENT: 33 minutes.   POSSIBLE D/C IN 1-2 DAYS, DEPENDING ON CLINICAL CONDITION.   Gladstone Lighter M.D on 10/01/2017 at 12:19 PM  Between 7am to 6pm - Pager - [phone]  After 6pm go to [email or web address] - password [email or web address]  Office  [phone]  CC: Primary care  physician; Cletis Athens, MD

## 2017-10-01 NOTE — Progress Notes (Signed)
Nursing 1:1 note D:Pt observed lying in bed. RR even and unlabored. No distress noted. A: 1:1 observation continues for safety  R: pt remains safe  

## 2017-10-01 NOTE — BHH Group Notes (Signed)
[facility] Group Notes:  (Nursing/MHT/Case Management/Adjunct)  Date:  10/01/2017  Time:  9:40 PM  Type of Therapy:  Group Therapy  Participation Level:  Did Not Attend   Summary of Progress/Problems:  Joyce [name] 10/01/2017, 9:40 PM

## 2017-10-01 NOTE — Plan of Care (Signed)
  Pain Managment: General experience of comfort will improve 10/01/2017 2243 - Progressing by Providence Crosby, RN Note Denies pain at this time   Education: Will be free of psychotic symptoms 10/01/2017 2243 - Not Progressing by Providence Crosby, RN Note Pt appears to be responding this evening   Safety: Ability to redirect hostility and anger into socially appropriate behaviors will improve 10/01/2017 2243 - Progressing by Providence Crosby, RN Note Pt has 1:1 sitter for safety   Safety: Ability to remain free from injury will improve 10/01/2017 2243 - Progressing by Providence Crosby, RN Note Pt safe at this time   Activity: Ability to tolerate increased activity will improve 10/01/2017 2243 - Not Progressing by Providence Crosby, RN Note Pt remaind bedfast majority of the time

## 2017-10-01 NOTE — Progress Notes (Signed)
Chaplain responded to an unspecified OR from the patient's physician. The patient was confused, but did not seem to want spiritual or pastoral care. Chaplain will refer the matter to Meredith Pel for follow-up.

## 2017-10-01 NOTE — Progress Notes (Signed)
[facility] Second Physician Opinion Progress Note for Medication Administration to Non-consenting Patients (For Involuntarily Committed Patients)  Patient: Joyce [name] Date of Birth: [id number] MRN: [id number]  Reason for the Medication: The patient, without the benefit of the specific treatment measure, is incapable of participating in any available treatment plan that will give the patient a realistic opportunity of improving the patient's condition.  Consideration of Side Effects: Consideration of the side effects related to the medication plan has been given.  Rationale for Medication Administration: Pt extremely confused, not eating or drinking fluids, not caring for herself, paranoid and hallucinating. Without treatment including medications, pt is at high risk of death.     Marylin Crosby, MD 10-17-2017  11:04 AM   This documentation is good for (7) seven days from the date of the MD signature. New documentation must be completed every seven (7) days with detailed justification in the medical record if the patient requires continued non-emergent administration of psychotropic medications.

## 2017-10-01 NOTE — Progress Notes (Signed)
Hourly rounding 0800 Patient is restless, trying to get out of bed. 1:1 sitter at bedside  0900 Pt. Continues to be restless, tossing and turning in bed and trying to get out of bed 1:1 sitter continued. 1000   Continues to be restless.  Talking to person that is not there.  1:1 continued.   1100 Staring blankly at the ceiling.   1:1 continued.   1200 Sitter attempting to feed patient.  Patient refusing to eat.  1300 In bed with back to door.  Eyes closed. Sitter continued.   1400 Laying in bed, talking nonsensically.  Sitter continued.   1500 Sitting in bed propped up on elbow. No verbal communication.  Sitter continued.  1600 Sitting in bed propped up on elbow. No verbal communication.  Sitter continued 1700 Sitting on side of bed attempting to eat dinner.  Sitter continued. 1800 Assisted up to [facility]. Sitter continued. 1900 Laying in bed staring at ceiling.  Sitter continued.

## 2017-10-01 NOTE — Progress Notes (Signed)
Nursing 1:1 note D:Pt observed lying in bed with eyes closed. RR even and unlabored. No distress noted. A: 1:1 observation continues for safety  R: pt remains safe  

## 2017-10-01 NOTE — Progress Notes (Signed)
2000 Nursing 1:1 note D:Pt observed sleeping in bed with eyes closed. RR even and unlabored. No distress noted.A: 1:1 observation continues for safety R: pt remains safe  2100 Nursing 1:1 note D:Pt observed sleeping in bed with eyes closed. RR even and unlabored. No distress noted.A: 1:1 observation continues for safety R: pt remains safe  2200 Nursing 1:1 note D:Pt observed sleeping in bed with eyes closed. RR even and unlabored. No distress noted.A: 1:1 observation continues for safety R: pt remains safe  2300  Nursing 1:1 note D:Pt observed sleeping in bed with eyes closed. RR even and unlabored. No distress noted.A: 1:1 observation continues for safety R: pt remains safe  0030Nursing 1:1 note D:Pt observed sleeping in bed with eyes closed. RR even and unlabored. No distress noted.A: 1:1 observation continues for safety R: pt remains safe

## 2017-10-01 NOTE — Progress Notes (Addendum)
[facility] MD Progress Note  10/01/2017 11:49 AM Mylin Gignac  MRN:  [id number]  Subjective:  Ms. Gillette had a quiet night but has not slept at all. She accepted her nighttime medications with encouragement. Her sister, Chauncey Reading, visited last night. The patient recognized her, and per nursing report, had a conversation with the patient about dialysis. We do not believe that the patient has the capacity to make medical decisions. She is paranoid, afraid of any stranger in the room. She recognizes me. She is scanning the room constantly. She hears people talk. She feels guilty of some crime she believes she committed and wants to be punished. She wants to go to jail. There is great latency of response as if attending to internal stimuli. At the end however, she is able to give a simple answer.  Treatment plan. We discontinue Tegretol, ammonia level is low and tegretol level pending. Continue Zyprexa zydis 5 mg TID. There is severe insomnia that needs to be addressed. This morning, the patient is agitated in spite of compliance with medications. She is trying to "get out". She will require forced medications. I ordered Haldol 5 mg to be given by mouth or injection as needed. Spoke with Dr. Wonda Olds asking for second opinion.   Neurology consult is greatly appreciated. At this point we are not ready to stop her psychotropic medications entirely but will stop Tegretol in preparation for ECT.  Nephrology consult is appreciated. The patient refused dialysis yesterday. No dialysis scheduled for today.  Medicine consult is appreciated. There is no identifiable cause for delirium. Awaiting palliative care consult.  Input from our psychiatrists, including Dr. Thurmond Butts, her primary psychiatrist, and Dr. Weber Cooks is appreciated. We still believe that ECT would be the best treatment for her. Family seems in agreement.  Social/disposition. I have daily conversation with her sister Ursula Beath, and her son and  daughter-in-law.  Principal Problem: Bipolar affective disorder, current episode manic with psychotic symptoms ([name]) Diagnosis:   Patient Active Problem List   Diagnosis Date Noted  . Bipolar affective disorder, current episode depressed with psychotic symptoms ([place]) [F31.2] 09/17/2017    Priority: High  . Bipolar I disorder, most recent episode (or current) manic ([place]) [F31.10] 09/02/2017  . Acute delirium [R41.0] 09/02/2017  . Altered mental status [R41.82]   . Acute encephalopathy [G93.40] 09/01/2017  . ESRD on dialysis ([place]) [N18.6, Z99.2] 04/06/2017  . Complication of vascular access for dialysis [T82.9XXA] 04/06/2017  . Hematuria [R31.9] 12/18/2016  . Polycystic kidney [Q61.3] 10/12/2016  . Ruptured cyst of kidney [Q61.00] 10/03/2016  . HTN (hypertension) [I10] 10/01/2016  . Chronic kidney disease [N18.9] 10/01/2016  . Anemia associated with chronic renal failure [D63.1] 09/24/2016  . Acute kidney insufficiency [N28.9] 07/28/2016  . Noninfectious diarrhea [K52.9]   . Benign neoplasm of cecum [D12.0]   . Benign neoplasm of ascending colon [D12.2]   . Diarrhea [R19.7]   . Nausea [R11.0]   . Gastric polyp [K31.7]   . Chronic constipation [K59.09] 12/27/2014  . Nausea with vomiting [R11.2] 12/27/2014  . Left sided abdominal pain [R10.9] 12/27/2014  . Personal history of colonic polyps [Z86.010] 05/10/2013   Total Time spent with patient: 45 minutes  Past Psychiatric History: bipolar disorder  Past Medical History:  Past Medical History:  Diagnosis Date  . Anal fissure   . Bipolar affective disorder ([place])   . CKD (chronic kidney disease)    Dr Holley Raring [name] 4  . Colon polyps   . Diverticulitis   . Diverticulitis   .  Family history of adverse reaction to anesthesia    mom - PONV  . GERD (gastroesophageal reflux disease)   . Headache    migraines - none over 10 yrs  . Heart murmur   . History of hiatal hernia   . Hypertension   . Pancreatitis    Valproic acid  .  Vertigo     Past Surgical History:  Procedure Laterality Date  . A/V FISTULAGRAM Left 04/14/2017   Procedure: A/V Fistulagram;  Surgeon: Katha Cabal, MD;  Location: [place] [facility];  Service: Cardiovascular;  Laterality: Left;  . A/V FISTULAGRAM Left 06/09/2017   Procedure: A/V FISTULAGRAM;  Surgeon: Katha Cabal, MD;  Location: [place] [facility];  Service: Cardiovascular;  Laterality: Left;  . ABDOMINAL HYSTERECTOMY  1990 ?  . AV FISTULA PLACEMENT  4/30  . BREAST EXCISIONAL BIOPSY Left 1994   neg surgical bx  . CHOLECYSTECTOMY  2003  . COLONOSCOPY  2014   Dr. Jamal Collin  . COLONOSCOPY WITH PROPOFOL N/A 09/24/2015   Procedure: COLONOSCOPY WITH random colon byopies.;  Surgeon: Lucilla Lame, MD;  Location: [place];  Service: Endoscopy;  Laterality: N/A;  . ESOPHAGOGASTRODUODENOSCOPY (EGD) WITH PROPOFOL N/A 09/24/2015   Procedure: ESOPHAGOGASTRODUODENOSCOPY (EGD) ;  Surgeon: Lucilla Lame, MD;  Location: [place];  Service: Endoscopy;  Laterality: N/A;  . EYE SURGERY    . PERIPHERAL VASCULAR CATHETERIZATION N/A 05/29/2015   Procedure: A/V Shuntogram/Fistulagram;  Surgeon: Katha Cabal, MD;  Location: [place] [facility];  Service: Cardiovascular;  Laterality: N/A;  . PERIPHERAL VASCULAR CATHETERIZATION N/A 05/29/2015   Procedure: A/V Shunt Intervention;  Surgeon: Katha Cabal, MD;  Location: [place] [facility];  Service: Cardiovascular;  Laterality: N/A;  . POLYPECTOMY  09/24/2015   Procedure: POLYPECTOMY INTESTINAL;  Surgeon: Lucilla Lame, MD;  Location: [place];  Service: Endoscopy;;  cecal polyp ascending polyp   Family History:  Family History  Problem Relation Age of Onset  . Stroke Father   . Hypertension Father   . Breast cancer Other   . Colon cancer Neg Hx   . Liver disease Neg Hx    Family Psychiatric  History: none reported Social History:  Social History   Substance and Sexual Activity  Alcohol Use No  .  Alcohol/week: 0.0 oz     Social History   Substance and Sexual Activity  Drug Use No    Social History   Socioeconomic History  . Marital status: Single    Spouse name: None  . Number of children: 1  . Years of education: None  . Highest education level: None  Social Needs  . Financial resource strain: None  . Food insecurity - worry: None  . Food insecurity - inability: None  . Transportation needs - medical: None  . Transportation needs - non-medical: None  Occupational History  . Occupation: Surveyor, quantity: [employer]  Tobacco Use  . Smoking status: Never Smoker  . Smokeless tobacco: Never Used  Substance and Sexual Activity  . Alcohol use: No    Alcohol/week: 0.0 oz  . Drug use: No  . Sexual activity: Not Currently  Other Topics Concern  . 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   Additional Social History:    History of alcohol / drug use?: No history of alcohol / drug abuse  Sleep: Poor  Appetite:  Poor  Current Medications: Current Facility-Administered Medications  Medication Dose Route Frequency Provider Last Rate Last Dose  . acetaminophen (TYLENOL) tablet 325 mg  325 mg Oral BID PRN Clapacs, [name], MD   325 mg at 09/27/17 1225  . alum & mag hydroxide-simeth (MAALOX/MYLANTA) 200-200-20 MG/5ML suspension 30 mL  30 mL Oral Q4H PRN Clapacs, [name], MD      . bisacodyl (DULCOLAX) EC tablet 5 mg  5 mg Oral Daily PRN Clapacs, [name], MD      . brimonidine (ALPHAGAN) 0.2 % ophthalmic solution 1 drop  1 drop Both Eyes BID Clapacs, [name], MD   1 drop at 09/29/17 0850  . [START ON 10/17/2017] cyanocobalamin ((VITAMIN B-12)) injection 1,000 mcg  1,000 mcg Intramuscular Q30 days Clapacs, [name], MD      . dicyclomine (BENTYL) tablet 20 mg  20 mg Oral TID AC Clapacs, [name], MD   20 mg at 09/29/17 0851  . haloperidol (HALDOL) tablet 5 mg  5 mg Oral Q8H  PRN Shacoria Latif B, MD       Or  . haloperidol lactate (HALDOL) injection 5 mg  5 mg Intramuscular Q8H PRN Layne Lebon B, MD   5 mg at 10/01/17 0908  . lidocaine-prilocaine (EMLA) cream 1 application  1 application Topical Q M,W,F Clapacs, [name], MD      . magnesium hydroxide (MILK OF MAGNESIA) suspension 30 mL  30 mL Oral Daily PRN Clapacs, [name], MD      . midodrine (PROAMATINE) tablet 10 mg  10 mg Oral TID WC [name], MD   10 mg at 09/29/17 0851  . multivitamin (RENA-VIT) tablet 1 tablet  1 tablet Oral QHS Bernal Luhman B, MD   1 tablet at 09/30/17 2038  . OLANZapine zydis (ZYPREXA) disintegrating tablet 5 mg  5 mg Oral TID Miner Koral B, MD   5 mg at 10/01/17 0908  . pantoprazole (PROTONIX) EC tablet 40 mg  40 mg Oral Daily Clapacs, [name], MD   40 mg at 09/29/17 0851  . timolol (TIMOPTIC) 0.5 % ophthalmic solution 1 drop  1 drop Both Eyes BID Clapacs, [name], MD   1 drop at 09/29/17 0850  . vitamin C (ASCORBIC ACID) tablet 500 mg  500 mg Oral BID Jessenya Berdan B, MD        Lab Results:  Results for orders placed or performed during the hospital encounter of 09/17/17 (from the past 48 hour(s))  CBC     Status: None   Collection Time: 10/01/17  7:11 AM  Result Value Ref Range   WBC 10.7 3.6 - 11.0 K/uL   RBC 4.09 3.80 - 5.20 MIL/uL   Hemoglobin 13.1 12.0 - 16.0 g/dL   HCT 39.0 35.0 - 47.0 %   MCV 95.4 80.0 - 100.0 fL   MCH 32.0 26.0 - 34.0 pg   MCHC 33.5 32.0 - 36.0 g/dL   RDW 14.0 11.5 - 14.5 %   Platelets 367 150 - 440 K/uL    Comment: Performed at [facility], [address] [place], [place] [address]  Renal function panel     Status: Abnormal   Collection Time: 10/01/17  7:11 AM  Result Value Ref Range   Sodium 141 135 - 145 mmol/L   Potassium 4.0 3.5 - 5.1 mmol/L   Chloride 99 (L) 101 - 111 mmol/L   CO2 23 22 - 32 mmol/L   Glucose,  Bld 117 (H) 65 - 99 mg/dL   BUN 51 (H) 6 - 20 mg/dL   Creatinine, Ser 7.39 (H) 0.44  - 1.00 mg/dL   Calcium 10.3 8.9 - 10.3 mg/dL   Phosphorus 4.0 2.5 - 4.6 mg/dL   Albumin 4.2 3.5 - 5.0 g/dL   GFR calc non Af Amer 5 (L) >60 mL/min   GFR calc Af Amer 6 (L) >60 mL/min    Comment: (NOTE) The eGFR has been calculated using the CKD EPI equation. This calculation has not been validated in all clinical situations. eGFR's persistently <60 mL/min signify possible Chronic Kidney Disease.    Anion gap 19 (H) 5 - 15    Comment: Performed at [facility], [address] [place], [place] [address]  Ammonia     Status: None   Collection Time: 10/01/17  7:11 AM  Result Value Ref Range   Ammonia 16 9 - 35 umol/L    Comment: Performed at [facility], [address] [place], [place] [address]    Blood Alcohol level:  Lab Results  Component Value Date   [facility] <10 84/13/2440    Metabolic Disorder Labs: Lab Results  Component Value Date   HGBA1C 4.8 09/18/2017   MPG 91.06 09/18/2017   No results found for: PROLACTIN Lab Results  Component Value Date   CHOL 251 (H) 09/18/2017   TRIG 124 09/18/2017   HDL 66 09/18/2017   CHOLHDL 3.8 09/18/2017   VLDL 25 09/18/2017   LDLCALC 160 (H) 09/18/2017   LDLCALC 43 05/14/2014    Physical Findings: AIMS: Facial and Oral Movements Muscles of Facial Expression: None, normal Lips and Perioral Area: None, normal Jaw: None, normal Tongue: None, normal,Extremity Movements Upper (arms, wrists, hands, fingers): None, normal Lower (legs, knees, ankles, toes): None, normal, Trunk Movements Neck, shoulders, hips: None, normal, Overall Severity Severity of abnormal movements (highest score from questions above): None, normal Incapacitation due to abnormal movements: None, normal Patient's awareness of abnormal movements (rate only patient's report): No Awareness, Dental Status Current problems with teeth and/or dentures?: No Does patient usually wear dentures?: No  CIWA:    COWS:     Musculoskeletal: Strength &  Muscle Tone: decreased Gait & Station: normal, unable to stand Patient leans: N/A  Psychiatric Specialty Exam: Physical Exam  Nursing note and vitals reviewed. Psychiatric: Her mood appears anxious. Her speech is delayed. She is withdrawn, actively hallucinating and combative. Thought content is paranoid and delusional. Cognition and memory are impaired. She expresses impulsivity.    Review of Systems  Neurological: Negative.   Psychiatric/Behavioral: Positive for hallucinations. The patient has insomnia.   All other systems reviewed and are negative.   Blood pressure (!) 149/83, pulse (!) 126, temperature 98.9 F (37.2 C), temperature source Oral, resp. rate 18, height 5' 3" (1.6 m), weight 72.6 kg (160 lb), SpO2 99 %.Body mass index is 28.34 kg/m.  General Appearance: Disheveled  Eye Contact:  Good  Speech:  Slow and delayed  Volume:  Decreased  Mood:  Anxious  Affect:  Flat  Thought Process:  Disorganized and Descriptions of Associations: Loose  Orientation:  Other:  person and place  Thought Content:  Delusions, Hallucinations: Auditory Visual and Paranoid Ideation  Suicidal Thoughts:  Yes.  without intent/plan  Homicidal Thoughts:  No  Memory:  Immediate;   Poor Recent;   Poor Remote;   Poor  Judgement:  Poor  Insight:  Lacking  Psychomotor Activity:  Increased  Concentration:  Concentration: Poor  and Attention Span: Poor  Recall:  Poor  Fund of Knowledge:  Fair  Language:  Poor  Akathisia:  No  Handed:  Right  AIMS (if indicated):     Assets:  Financial Resources/Insurance Housing Social Support  ADL's:  Impaired  Cognition:  Impaired,  Mild  Sleep:  Number of Hours: 0     Treatment Plan Summary: Daily contact with patient to assess and evaluate symptoms and progress in treatment and Medication management   Ms. Windsor is a 72 year old female with a history of bipolar disorder and ESRD admitted for psychotic break.  #Agitation -start haldol 5 mg TID PRN po  or im  #Mood/psychosis -we discontinued Abilify and Seroquelthat were prescribed in the community -we discontinued Haldol, Remeron and Lithiumgiven briefly over the weekend -continue Zyprexa 5 mg TID -discontinue Tegretol in anticipation of ECT, Tegretol level pending -consider ECT  #AMS -Ammonia level is not elevated -neurology consult is appreciated  #Autonomic instability -Midrin  -medicine consult is greatly appreciated -palliative care consult pending -Ethics Committee consult pending   #Poor oral intake -spoke with dietitian abou calorie count, patient consumes no calories -start Vit C -MVI -refuses Ensure  #UTI -completed course of Keflex  #Dialyses -nephrology input is greatly appreciated -patient will continue with dialysis -Midrin before each treatment  #GERD -Protonix 40 mg daily  #Glaucoma -continue eye drops  #Vitamin B12 deficiency -continue B12  -vit B12 [id number]  #Labs, metabolic syndrome monitoring -lipid panel, TSH and HgbA1C are unremarkable -EKG, QTc 440  #Admission status -voluntary admission  #Disposition -[facility] -follow up with Dr. Bryna Colander, MD 10/01/2017, 11:49 AM

## 2017-10-01 NOTE — Progress Notes (Signed)
Nursing 1:1 note D:Pt observed sleeping in bed with eyes closed. RR even and unlabored. No distress noted. A: 1:1 observation continues for safety  R: pt remains safe  

## 2017-10-01 NOTE — Progress Notes (Signed)
[facility]  ROUNDING NOTE   Subjective:   Off carbamazepine.   Continues to be agitated.   Labs stable. No dialysis for today.   Objective:  Vital signs in last 24 hours:  Pulse Rate:  [126] 126 (03/14 0629) Resp:  [18] 18 (03/14 0629) BP: (149)/(83) 149/83 (03/14 0629) SpO2:  [99 %] 99 % (03/14 0629)  Weight change:  Filed Weights    Intake/Output: No intake/output data recorded.   Intake/Output this shift:  No intake/output data recorded.  Physical Exam: General: NAD, laying in bed  Head: Normocephalic, atraumatic. Moist oral mucosal membranes  Eyes: Anicteric  Neck: Supple, trachea midline  Lungs:  Clear to auscultation  Heart: Regular rate and rhythm  Abdomen:  Soft, nontender  Extremities: no peripheral edema.  Psych:  Flat affect. Slow to respond  Neurologic: Nonfocal, moving all four extremities  Skin: No lesions  Access: Left AVF    Basic Metabolic Panel: Recent Labs  Lab 09/25/17 1043 09/28/17 1335 10/01/17 0711  NA 138 138 141  K 3.0* 4.0 4.0  CL 96* 96* 99*  CO2 26 27 23   GLUCOSE 170* 108* 117*  BUN 40* 54* 51*  CREATININE 6.61* 7.19* 7.39*  CALCIUM 9.3 9.6 10.3  PHOS  --  3.1 4.0    Liver Function Tests: Recent Labs  Lab 09/28/17 1335 10/01/17 0711  ALBUMIN 3.9 4.2   No results for input(s): LIPASE, AMYLASE in the last 168 hours. Recent Labs  Lab 10/01/17 0711  AMMONIA 16    CBC: Recent Labs  Lab 09/28/17 1335 10/01/17 0711  WBC 8.6 10.7  HGB 11.9* 13.1  HCT 36.2 39.0  MCV 95.8 95.4  PLT 274 367    Cardiac Enzymes: No results for input(s): CKTOTAL, CKMB, CKMBINDEX, TROPONINI in the last 168 hours.  BNP: Invalid input(s): POCBNP  CBG: No results for input(s): GLUCAP in the last 168 hours.  Microbiology: Results for orders placed or performed during the hospital encounter of 09/01/17  Urine culture     Status: Abnormal   Collection Time: 09/01/17  4:32 PM  Result Value Ref Range Status   Specimen Description   Final    URINE, RANDOM Performed at [facility], [address], [place], [place] [address]    Special Requests   Final    NONE Performed at [facility], [address] [place], [place] [address]    Culture MULTIPLE SPECIES PRESENT, SUGGEST RECOLLECTION (A)  Final   Report Status 09/03/2017 FINAL  Final  Culture, blood (routine x 2)     Status: None   Collection Time: 09/01/17  6:05 PM  Result Value Ref Range Status   Specimen Description BLOOD RIGHT HAND  Final   Special Requests   Final    BOTTLES DRAWN AEROBIC AND ANAEROBIC Blood Culture results may not be optimal due to an excessive volume of blood received in culture bottles   Culture   Final    NO GROWTH 5 DAYS Performed at [facility], [address], [place], [place] [address]    Report Status 09/06/2017 FINAL  Final  Culture, blood (routine x 2)     Status: None   Collection Time: 09/01/17  6:34 PM  Result Value Ref Range Status   Specimen Description BLOOD RAC  Final   Special Requests   Final    BOTTLES DRAWN AEROBIC AND ANAEROBIC Blood Culture adequate volume   Culture   Final    NO GROWTH 5 DAYS  Performed at [facility], [address] [place], [place] [address]    Report Status 09/06/2017 FINAL  Final    Coagulation Studies: No results for input(s): LABPROT, INR in the last 72 hours.  Urinalysis: No results for input(s): COLORURINE, LABSPEC, PHURINE, GLUCOSEU, HGBUR, BILIRUBINUR, KETONESUR, PROTEINUR, UROBILINOGEN, NITRITE, LEUKOCYTESUR in the last 72 hours.  Invalid input(s): APPERANCEUR    Imaging: No results found.   Medications:    . brimonidine  1 drop Both Eyes BID  . [START ON 10/17/2017] cyanocobalamin  1,000 mcg Intramuscular Q30 days  . dicyclomine  20 mg Oral TID AC  . lidocaine-prilocaine  1 application Topical Q M,W,F  . midodrine  10 mg Oral TID WC  . multivitamin  1 tablet Oral QHS  . OLANZapine zydis  5 mg Oral TID   . pantoprazole  40 mg Oral Daily  . timolol  1 drop Both Eyes BID  . vitamin C  500 mg Oral BID   acetaminophen, alum & mag hydroxide-simeth, bisacodyl, haloperidol **OR** haloperidol lactate, magnesium hydroxide  Assessment/ Plan:  Joyce [name] is a 72 y.o. white female with end stage renal disease on hemodialysis, diverticulosis, hypotension, bipolar disorder, polycystic kidney disease   [facility]/MWF  1.  ESRD on HD MWF.  Refused treatment yesterday.  Monitor daily for dialysis need Dialysis for tomorrow after ECT  2.  Anemia of CKD: hemoglobin 13.1 - Holding EPO  3.  Secondary hyperparathyroidism: Phosphorus at goal. PTH 150. Calcium elevated this morning.  Not currently on binders.   4. Hypotension:  continue midodrine prior to dialysis.   5. Delirium: doubtful this is uremic encephalopathy as BUN is low and patient has been getting some dialysis.  Ammonia level is normal.  - Check labs daily - Appreciate Psych input.   Discussed case with Dr. Bary Leriche    LOS: [place] 3/14/201911:51 AM

## 2017-10-01 NOTE — Consult Note (Addendum)
Consultation Note Date: 10/01/2017   Patient Name: Joyce [name]  DOB: 10-Mar-1946  MRN: [id number]  Age / Sex: 72 y.o., female  PCP: Joyce Athens, MD Referring Physician: Clovis Fredrickson, MD  Reason for Consultation: Establishing goals of care  HPI/Patient Profile: 72 y.o. female  with past medical history of Bipolar disorder, diastolic heart failure, and ESRD on hemodialysis secondary to polycystic kidney disease who was admitted with altered mental status.  Ms. Gardiner had a recent admission for the same in mid February.  She improved in the hospital and returned home.  She returned to the hospital at the beginning of March and has had and vacillating course with agitation, paranoia and psychosis.  Recently she began to refuse hemodialysis as well as food and water.  Consequently Palliative was called to assist with goals of care.   Clinical Assessment and Goals of Care:  I have reviewed medical records including EPIC notes, labs and imaging, received report from Psychiatry, Nephrology, and the patient's RN, assessed the patient and then spoke on the phone with her sister, HCPOA to discuss diagnosis prognosis, GOC, EOL wishes, disposition and options.  I introduced Palliative Medicine as specialized medical care for people living with serious illness. It focuses on providing relief from the symptoms and stress of a serious illness. The goal is to improve quality of life for both the patient and the family.  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  Per her sister, Joyce [name]'s medical issues, medications, and doctor's appointments had become to overwhelming for Joyce [name] to manage.  Joyce [name] has missed medications and missed HD treatments.  One day she will have an HD treatment, and the next day she will have multiple doctors  appointments.  Per Joyce [name]) it is simply too much for Joyce [name].  Joyce [name] reflected that she has seen a decline several months.  Joyce [name] left [employer] because of her medical issues and busy schedule.  Specifically with regard to HD - per Joyce [name] was tolerating it ok.  She states she never complained about it.  Per Nephrology the patient was looking forward to starting HD last November after preparing for it for the past 5 years.    Joyce [name] and I discussed Joyce [name]'s physical issues.  She has had problems with her eyes.  She has felt weaker and more fatigued.  She has been suffering with painful restless leg syndrome.  Current echo reveals grade 2 diastolic heart failure with elevated pulmonary pressures - so I talked with Joyce [name] about diastolic heart failure and pulmonary HTN.    We talked about having to stop HD at some point (hopefully a long time from now) and I explained that without HD, ESRD patients usually have about 2 weeks to live.  This was surprising to Joyce [name].  I also explained that consistent HD was important in order to minimize wear on the body and minimize  the potential for emergent HD to become necessary.  We talked about Joyce [name] treatment and the hope that it would make a dramatic difference in a short period of time.  Joyce [name] asked if we could continue to "force" Joyce [name] to take HD.  I replied that we could do it for a short time while we are trying to remedy her current psychosis - as long as we could keep her safe and still.  Forcing someone to take HD is likely not something we would want to consider long term - particularly given Joyce [name]'s living will.  Joyce [name] completed a living will in 2016.  She did not want life prolonging measures if she had an incurable illness that would result in her death in a relatively short period of time.   She did not want a feeding tube.  She named her sister Joyce [name] as her HCPOA.  Addendum:  After discussing the case with  Joyce [name] of Joyce [name], I called the HCPOA back once more and we discussed code status.  If the patient arrests (cardiac or respiratory) Joyce [name] feels that Joyce [name] would not want resuscitation.  Joyce [name] requested DNR status with full scope treatment - meaning that if the patient has a pulse or is breathing she will receive IVF, medications, Bipap.  Joyce [name] does not want intubation, chest compressions or ACLS.   Questions and concerns were addressed.  The family was encouraged to call with questions or concerns.    Primary Decision Maker:  HCPOA Sister Joyce [name]    SUMMARY OF RECOMMENDATIONS    Discussed with Psychiatry, Nephrology, and HCPOA.   Patient had not voiced a desire to end hemodialysis prior to becoming psychotic. Medical providers and HCPOA are in agreement with continuing HD while initiating Joyce [name].  Joyce [name] may have a quick dramatic improvement. If so and the patient can return to baseline - it would be prudent in the coming weeks to have discussions with her about what are appropriate circumstances in which she would want to stop HD.  Of significant concern is the patient's fluid and nutritional intake.  We have only a limited number days of not drinking/eating before IV fluids and dextrose will need to be started.  If we are going "full court press" for improvement at this point I would recommend we closely monitor her hydration/CBG status and give IVF w D5 if needed.    I am hopeful that Joyce [name] will allow her to drink and eat soon.  The patient's living will did indicate that she does not want a feeding tube.    If we reach a point next week where she is still not eating and drinking, refusing dialysis, and declining further, PMT will have further conversations with the family.  PMT will follow along with you and remain hopeful for improvement.  Code Status/Advance Care Planning: DNR status with full scope treatment - meaning that if the patient has a pulse or is breathing she will  receive IVF, medications, Bipap.  Joyce [name] does not want intubation, chest compressions or ACLS.   Palliative Prophylaxis:   Patient's family is concerned she may be in pain and stated her restless legs were often painful to her.  - I have conveyed this to her attending physician.  Prognosis:  Unable to determine at this point.    Discharge Planning: To Be Determined      Primary Diagnoses: Present on Admission: . Bipolar affective disorder, current episode depressed with psychotic symptoms ([place])   I have reviewed  the medical record, interviewed the patient and family, and examined the patient. The following aspects are pertinent.  Past Medical History:  Diagnosis Date  . Anal fissure   . Bipolar affective disorder ([place])   . CKD (chronic kidney disease)    Dr Holley Raring [name] 4  . Colon polyps   . Diverticulitis   . Diverticulitis   . Family history of adverse reaction to anesthesia    mom - PONV  . GERD (gastroesophageal reflux disease)   . Headache    migraines - none over 10 yrs  . Heart murmur   . History of hiatal hernia   . Hypertension   . Pancreatitis    Valproic acid  . Vertigo    Social History   Socioeconomic History  . Marital status: Single    Spouse name: None  . Number of children: 1  . Years of education: None  . Highest education level: None  Social Needs  . Financial resource strain: None  . Food insecurity - worry: None  . Food insecurity - inability: None  . Transportation needs - medical: None  . Transportation needs - non-medical: None  Occupational History  . Occupation: Surveyor, quantity: [employer]  Tobacco Use  . Smoking status: Never Smoker  . Smokeless tobacco: Never Used  Substance and Sexual Activity  . Alcohol use: No    Alcohol/week: 0.0 oz  . Drug use: No  . Sexual activity: Not Currently  Other Topics Concern  . 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   Family History  Problem Relation Age of Onset  . Stroke Father   . Hypertension Father   . Breast cancer Other   . Colon cancer Neg Hx   . Liver disease Neg Hx    Scheduled Meds: . brimonidine  1 drop Both Eyes BID  . [START ON 10/17/2017] cyanocobalamin  1,000 mcg Intramuscular Q30 days  . dicyclomine  20 mg Oral TID AC  . lidocaine-prilocaine  1 application Topical Q M,W,F  . midodrine  10 mg Oral TID WC  . multivitamin  1 tablet Oral QHS  . OLANZapine zydis  5 mg Oral TID  . pantoprazole  40 mg Oral Daily  . timolol  1 drop Both Eyes BID  . vitamin C  500 mg Oral BID   Continuous Infusions: PRN Meds:.acetaminophen, alum & mag hydroxide-simeth, bisacodyl, haloperidol **OR** haloperidol lactate, magnesium hydroxide Allergies  Allergen Reactions  . Morphine And Related Shortness Of Breath    Pt reports chest pain and difficulty breathing.  . Indomethacin Hives  . Pollen Extract Other (See Comments)    Sinus problems and HA   Review of Systems patient unable to provide.  Physical Exam  Well developed female, awake, in fetal position facing away from me.   She does not roll over.  She responds to me with a grunt / moan. She pulls away from me when I touch her shoulder. She will not follow commands or speak with me.  Vital Signs: BP (!) 149/83 (BP Location: Right Arm)   Pulse (!) 126   Temp 98.9 F (37.2 C) (Oral)   Resp 18   Ht 5\' 3"  (1.6 m)   Wt 72.6 kg (160 lb)   SpO2 99%   BMI 28.34 kg/m  Pain Assessment: No/denies pain   Pain Score: 0-No pain   SpO2: SpO2: 99 %  O2 Device:SpO2: 99 % O2 Flow Rate: .   IO: Intake/output summary:   Intake/Output Summary (Last 24 hours) at 10/01/2017 1214 Last data filed at 10/01/2017 0700 Gross per 24 hour  Intake 0 ml  Output -  Net 0 ml    LBM: Last BM Date: 09/29/17 Baseline Weight: Weight: 72.6 kg (160 lb) Most recent weight: Weight: (unable to weigh pt in chair )      Time In: 11:00 Time  Out: 12:10 Time Total: 70 min. Greater than 50%  of this time was spent counseling and coordinating care related to the above assessment and plan.  Signed by: Florentina Jenny, PA-C Palliative Medicine Pager: [phone]  Please contact Palliative Medicine Team phone at [phone] for questions and concerns.  For individual provider: See Shea Evans

## 2017-10-01 NOTE — B[facility] Group Notes (Signed)
  10/01/2017 9am  Type of Therapy and Topic: Group Therapy: Goals Group: SMART Goals   Participation Level: Did Not Attend  Description of Group:    The purpose of a daily goals group is to assist and guide patients in setting recovery/wellness-related goals. The objective is to set goals as they relate to the crisis in which they were admitted. Patients will be using SMART goal modalities to set measurable goals. Characteristics of realistic goals will be discussed and patients will be assisted in setting and processing how one will reach their goal. Facilitator will also assist patients in applying interventions and coping skills learned in psycho-education groups to the SMART goal and process how one will achieve defined goal.   Therapeutic Goals:   -Patients will develop and document one goal related to or their crisis in which brought them into treatment.  -Patients will be guided by LCSW using SMART goal setting modality in how to set a measurable, attainable, realistic and time sensitive goal.  -Patients will process barriers in reaching goal.  -Patients will process interventions in how to overcome and successful in reaching goal.   Patient's Goal: Patient was encouraged and invited to attend group. Patient did not attend group. Social worker will continue to encourage group participation in the future.    Therapeutic Modalities:  Motivational Interviewing  Cognitive Behavioral Therapy  Crisis Intervention Model  SMART goals setting  Darin Engels, [place] 10/01/2017 10:10 AM

## 2017-10-02 ENCOUNTER — Inpatient Hospital Stay: Payer: Medicare HMO | Admitting: Anesthesiology

## 2017-10-02 DIAGNOSIS — R410 Disorientation, unspecified: Secondary | ICD-10-CM

## 2017-10-02 DIAGNOSIS — K219 Gastro-esophageal reflux disease without esophagitis: Secondary | ICD-10-CM | POA: Diagnosis not present

## 2017-10-02 DIAGNOSIS — I120 Hypertensive chronic kidney disease with stage 5 chronic kidney disease or end stage renal disease: Secondary | ICD-10-CM | POA: Diagnosis not present

## 2017-10-02 DIAGNOSIS — N186 End stage renal disease: Secondary | ICD-10-CM | POA: Diagnosis not present

## 2017-10-02 DIAGNOSIS — F315 Bipolar disorder, current episode depressed, severe, with psychotic features: Secondary | ICD-10-CM | POA: Diagnosis not present

## 2017-10-02 LAB — RENAL FUNCTION PANEL
ANION GAP: 20 — AB (ref 5–15)
Albumin: 4 g/dL (ref 3.5–5.0)
BUN: 76 mg/dL — AB (ref 6–20)
CALCIUM: 9.7 mg/dL (ref 8.9–10.3)
CO2: 22 mmol/L (ref 22–32)
Chloride: 101 mmol/L (ref 101–111)
Creatinine, Ser: 9.74 mg/dL — ABNORMAL HIGH (ref 0.44–1.00)
GFR calc Af Amer: 4 mL/min — ABNORMAL LOW (ref >60)
GFR calc non Af Amer: 4 mL/min — ABNORMAL LOW (ref >60)
GLUCOSE: 113 mg/dL — AB (ref 65–99)
Phosphorus: 4.7 mg/dL — ABNORMAL HIGH (ref 2.5–4.6)
Potassium: 4.1 mmol/L (ref 3.5–5.1)
SODIUM: 143 mmol/L (ref 135–145)

## 2017-10-02 LAB — CBC
HCT: 37.1 % (ref 35.0–47.0)
HEMOGLOBIN: 12.1 g/dL (ref 12.0–16.0)
MCH: 31.5 pg (ref 26.0–34.0)
MCHC: 32.5 g/dL (ref 32.0–36.0)
MCV: 96.9 fL (ref 80.0–100.0)
Platelets: 385 10*3/uL (ref 150–440)
RBC: 3.83 MIL/uL (ref 3.80–5.20)
RDW: 14.4 % (ref 11.5–14.5)
WBC: 11.5 10*3/uL — ABNORMAL HIGH (ref 3.6–11.0)

## 2017-10-02 LAB — GLUCOSE, CAPILLARY
GLUCOSE-CAPILLARY: 104 mg/dL — AB (ref 65–99)
GLUCOSE-CAPILLARY: 103 mg/dL — AB (ref 65–99)

## 2017-10-02 MED ORDER — SUCCINYLCHOLINE CHLORIDE 20 MG/ML IJ SOLN
INTRAMUSCULAR | Status: DC | PRN
  Administered 2017-10-02: 80 mg via INTRAVENOUS

## 2017-10-02 MED ORDER — SODIUM CHLORIDE 0.9 % IV SOLN
500.0000 mL | Freq: Once | INTRAVENOUS | Status: AC
  Administered 2017-10-02: 500 mL via INTRAVENOUS

## 2017-10-02 MED ORDER — METHOHEXITAL SODIUM 100 MG/10ML IV SOSY
PREFILLED_SYRINGE | INTRAVENOUS | Status: DC | PRN
  Administered 2017-10-02: 70 mg via INTRAVENOUS

## 2017-10-02 MED ORDER — METOPROLOL TARTRATE 25 MG PO TABS
25.0000 mg | ORAL_TABLET | Freq: Two times a day (BID) | ORAL | Status: DC
  Administered 2017-10-03 – 2017-10-10 (×9): 25 mg via ORAL
  Filled 2017-10-02 (×11): qty 1

## 2017-10-02 MED ORDER — SODIUM CHLORIDE 0.9 % IV SOLN
INTRAVENOUS | Status: DC | PRN
  Administered 2017-10-02: 10:00:00 via INTRAVENOUS

## 2017-10-02 NOTE — Progress Notes (Signed)
Pre dialysis assessment 

## 2017-10-02 NOTE — Progress Notes (Signed)
[place] [facility] [place] NAME: Millie Forde    MR#:  [id number]  DATE OF BIRTH:  1945/09/11  SUBJECTIVE:  CHIEF COMPLAINT:  No chief complaint on file.  - appears slightly better today, had her first ECT treatment today - answering simple questions and also ate a little or lunch  REVIEW OF SYSTEMS:  Review of Systems  Constitutional: Positive for malaise/fatigue. Negative for chills and fever.  Respiratory: Negative for cough, shortness of breath and wheezing.   Cardiovascular: Negative for chest pain and palpitations.  Gastrointestinal: Negative for abdominal pain, constipation, diarrhea, nausea and vomiting.  Genitourinary: Negative for dysuria.  Neurological: Negative for dizziness, seizures and headaches.    DRUG ALLERGIES:   Allergies  Allergen Reactions  . Morphine And Related Shortness Of Breath    Pt reports chest pain and difficulty breathing.  . Indomethacin Hives  . Pollen Extract Other (See Comments)    Sinus problems and HA    VITALS:  Blood pressure (!) 142/74, pulse (!) 118, temperature 98.8 F (37.1 C), resp. rate 19, height 5\' 3"  (1.6 m), weight 72.6 kg (160 lb), SpO2 93 %.  PHYSICAL EXAMINATION:  Physical Exam  GENERAL:  72 y.o.-year-old patient lying in the bed with no acute distress.  EYES: Pupils equal, round, reactive to light and accommodation. No scleral icterus. Extraocular muscles intact.  HEENT: Head atraumatic, normocephalic. Oropharynx and nasopharynx clear. Dry mucus membranes NECK:  Supple, no jugular venous distention. No thyroid enlargement, no tenderness.  LUNGS: Normal breath sounds bilaterally, no wheezing, rales,rhonchi or crepitation. No use of accessory muscles of respiration. Decreased bibasilar breath sounds CARDIOVASCULAR: S1, S2 normal. No murmurs, rubs, or gallops.  ABDOMEN: Soft, nontender, nondistended. Bowel sounds present. No organomegaly or mass.  EXTREMITIES: No pedal edema,  cyanosis, or clubbing.  NEUROLOGIC: Cranial nerves II through XII are intact.  No focal motor or sensory deficits. Gait not checked. Global weakness PSYCHIATRIC: The patient is alert, and following commands SKIN: No obvious rash, lesion, or ulcer.    LABORATORY PANEL:   CBC Recent Labs  Lab 10/01/17 0711  WBC 10.7  HGB 13.1  HCT 39.0  PLT 367   ------------------------------------------------------------------------------------------------------------------  Chemistries  Recent Labs  Lab 10/01/17 0711  NA 141  K 4.0  CL 99*  CO2 23  GLUCOSE 117*  BUN 51*  CREATININE 7.39*  CALCIUM 10.3   ------------------------------------------------------------------------------------------------------------------  Cardiac Enzymes No results for input(s): TROPONINI in the last 168 hours. ------------------------------------------------------------------------------------------------------------------  RADIOLOGY:  No results found.  EKG:   Orders placed or performed during the hospital encounter of 09/17/17  . EKG 12-Lead  . EKG 12-Lead  . EKG 12-Lead  . EKG 12-Lead    ASSESSMENT AND PLAN:   72 y/o female with PMH significant for bipolar, schizophrenia, ESRD on dialysis admitted for psychotic symptoms  1. Acute encephalopathy- likely psychosis from underlying psych disorder, catatonia - agree with ECT- some improvement already seen  - continue psych meds- on zyprexa - appreciate neurology consult- MRI last month without any acute findings  2. ESRD- nephrology following, patient refused dialysis Hopefully with ECT- she might improve- family wants to continue dialysis for now  3. Failure to thrive- palliative care consulted Patient is a DNR Plan is to continue ECT and see improvement. If at any point- patient needs IV fluids- consult medicine- but improved intake today.  4. Orthostatic hypotension- needs fluids- if can take orally. Continue midodrine Low dose  metoprolol added for tachycardia  and BP increasing- if BP persists high- nephrology can decide to do midodrine only prn for dialysis as needed  5. GERD- protonix  Has a sitter at bedside. Nothing to offer medically at this time. Will sign off- please consult again if any concerns.   All the records are reviewed and case discussed with Care Management/Social Worker. Management plans discussed with the patient, family and they are in agreement.  CODE STATUS: DNR  TOTAL TIME TAKING CARE OF THIS PATIENT: 33 minutes.     Gladstone Lighter M.D on 10/02/2017 at 1:45 PM  Between 7am to 6pm - Pager - [phone]  After 6pm go to [email or web address] - password [email or web address] [facility]  Office  [phone]  CC: Primary care physician; Cletis Athens, MD

## 2017-10-02 NOTE — Progress Notes (Signed)
Daily Progress Note   Patient Name: Joyce [name]       Date: 10/02/2017 DOB: Aug 01, 1945  Age: 72 y.o. MRN#: [id number] Attending Physician: Gonzella Lex, MD Primary Care Physician: Cletis Athens, MD Admit Date: 09/17/2017  Reason for Consultation/Follow-up: Establishing goals of care  Subjective: Met patient's sister HCPOA in the second floor and walked together to where the patient was being prepared for ECT.  Patient is awake and talking this morning.  Her speech is slow and she is confused.  Sister, pastor and I prayed with the patient.  Sister expresses her hopefulness for the success of the treatment.  Sitter at bedside as patient continues to attempt to get out of bed.   Assessment: 72 yo female with bipolar illness currently suffering with delirium, pmh polycystic kidney disease now ESRD on HD, and D-HF.  She is anxious and confused.  Preparing for ECT.  She has refused to eat and drink.   Patient Profile/HPI:  72 y.o. female  with past medical history of Bipolar disorder, diastolic heart failure, and ESRD on hemodialysis secondary to polycystic kidney disease who was admitted with altered mental status.  Ms. Steinberg had a recent admission for the same in mid February.  She improved in the hospital and returned home.  She returned to the hospital at the beginning of March and has had and vacillating course with agitation, paranoia and psychosis.  Recently she began to refuse hemodialysis as well as food and water.  Consequently Palliative was called to assist with goals of care.   Length of Stay: 15  Current Medications: Scheduled Meds:  . brimonidine  1 drop Both Eyes BID  . [START ON 10/17/2017] cyanocobalamin  1,000 mcg Intramuscular Q30 days  . dicyclomine  20 mg Oral TID AC    . lidocaine-prilocaine  1 application Topical Q M,W,F  . midodrine  10 mg Oral TID WC  . multivitamin  1 tablet Oral QHS  . OLANZapine zydis  5 mg Oral TID  . pantoprazole  40 mg Oral Daily  . timolol  1 drop Both Eyes BID  . vitamin C  500 mg Oral BID    Continuous Infusions:   PRN Meds: acetaminophen, alum & mag hydroxide-simeth, bisacodyl, haloperidol **OR** haloperidol lactate, magnesium hydroxide  Physical Exam  Well developed female, confused anxious.  Awake and alert CV tachycardic Resp no distress Lower ext with 1+ edema.  Vital Signs: BP (!) 142/74   Pulse (!) 118   Temp 98.8 F (37.1 C)   Resp 19   Ht _0  (1.6 m)   Wt 72.6 kg (160 lb)   SpO2 93%   BMI 28.34 kg/m  SpO2: SpO2: 93 % O2 Device: O2 Device: Room Air O2 Flow Rate:    Intake/output summary:   Intake/Output Summary (Last 24 hours) at 10/02/2017 1113 Last data filed at 10/02/2017 0827 Gross per 24 hour  Intake 0 ml  Output -  Net 0 ml   LBM: Last BM Date: 09/29/17 Baseline Weight: Weight: 72.6 kg (160 lb) Most recent weight: Weight: (unable to weigh pt in chair )        Patient Active Problem List   Diagnosis Date Noted  . Palliative care encounter   . Bipolar affective disorder, current episode depressed with psychotic symptoms ([place]) 09/17/2017  . Bipolar I disorder, most recent episode (or current) manic ([place]) 09/02/2017  . Acute delirium 09/02/2017  . Altered mental status   . Acute encephalopathy 09/01/2017  . ESRD on dialysis ([place]) 04/06/2017  . Complication of vascular access for dialysis 04/06/2017  . Hematuria 12/18/2016  . Polycystic kidney 10/12/2016  . Ruptured cyst of kidney 10/03/2016  . HTN (hypertension) 10/01/2016  . Chronic kidney disease 10/01/2016  . Anemia associated with chronic renal failure 09/24/2016  . Acute kidney insufficiency 07/28/2016  . Noninfectious diarrhea   . Benign neoplasm of cecum   . Benign neoplasm of ascending colon   . Diarrhea    . Nausea   . Gastric polyp   . Chronic constipation 12/27/2014  . Nausea with vomiting 12/27/2014  . Left sided abdominal pain 12/27/2014  . Personal history of colonic polyps 05/10/2013    Palliative Care Plan    Recommendations/Plan:  Chauncey Reading is sister Sherri Rad  Plan is for full scope treatment   If patient is not eating / drinking please consider checking CBG at least once daily and bmet with each dialysis.  Monitor hydration status she may need on-going IVF.  Goals of Care and Additional Recommendations:  Limitations on Scope of Treatment: Full Scope Treatment  Code Status:  DNR as discussed with Ursula Beath on 3/14.  Prognosis:   Unable to determine   Discharge Planning:  Will need to be in some type of assisted environment.  Thank you for allowing the Palliative Medicine Team to assist in the care of this patient.  Total time spent:   35 min.     Greater than 50%  of this time was spent counseling and coordinating care related to the above assessment and plan.  Florentina Jenny, PA-C Palliative Medicine  Please contact Palliative MedicineTeam phone at [phone] for questions and concerns between 7 am - 7 pm.   Please see AMION for individual provider pager numbers.

## 2017-10-02 NOTE — Progress Notes (Signed)
Received Joyce [name] this AM in her room with the sitter at the bedside. She is in a fetal position and drooling at the mouth. She was cooperative and non - combative during her preparation for ECT. She returned from Her procedure and the MHT reported she ate several spoonfuls of her lunch along with drinking most of her tea. Her medications was placed in applesauce and she was compliant. She was bathed and transported to dialysis with incident.  She returned from ECT at 1900 hrs with her sitter. She received 2 hrs of her dialysis related to low  B/P.

## 2017-10-02 NOTE — Procedures (Signed)
ECT SERVICES Physician's Interval Evaluation & Treatment Note  Patient Identification: Ernesteen Mihalic MRN:  [id number] Date of Evaluation:  10/02/2017 TX #: 1  MADRS:   MMSE:   P.E. Findings:  Patient is debilitated wasted tachycardic but lungs and heart sound okay  Psychiatric Interval Note:  Patient is extremely withdrawn confused somewhere between delirious and catatonic  Subjective:  Patient is a 72 y.o. female seen for evaluation for Electroconvulsive Therapy. Patient has no specific complaints  Treatment Summary:   []   Right Unilateral             [x]  Bilateral   % Energy : 1.0 ms 50%   Impedance: 1900 ohms  Seizure Energy Index: 19,323 V squared  Postictal Suppression Index: 89%  Seizure Concordance Index: 98%  Medications  Pre Shock: Brevital 70 mg succinylcholine 80 mg  Post Shock:    Seizure Duration: 29 seconds EMG 60 seconds EEG   Comments: Follow-up Monday Wednesday and Friday  Lungs:  [x]   Clear to auscultation               []  Other:   Heart:    [x]   Regular rhythm             []  irregular rhythm    [x]   Previous H&P reviewed, patient examined and there are NO CHANGES                 []   Previous H&P reviewed, patient examined and there are changes noted.   Alethia Berthold, MD 3/15/201910:27 AM

## 2017-10-02 NOTE — Progress Notes (Signed)
Patient is sedated, moving access arm without regard for fistula needles.

## 2017-10-02 NOTE — [facility] Group Notes (Signed)
LCSW Group Therapy Note  10/02/2017 1:00 pm  Type of Therapy and Topic:  Group Therapy:  Feelings around Relapse and Recovery  Participation Level:  Did Not Attend   Description of Group:    Patients in this group will discuss emotions they experience before and after a relapse. They will process how experiencing these feelings, or avoidance of experiencing them, relates to having a relapse. Facilitator will guide patients to explore emotions they have related to recovery. Patients will be encouraged to process which emotions are more powerful. They will be guided to discuss the emotional reaction significant others in their lives may have to their relapse or recovery. Patients will be assisted in exploring ways to respond to the emotions of others without this contributing to a relapse.  Therapeutic Goals: 1. Patient will identify two or more emotions that lead to a relapse for them 2. Patient will identify two emotions that result when they relapse 3. Patient will identify two emotions related to recovery 4. Patient will demonstrate ability to communicate their needs through discussion and/or role plays   Summary of Patient Progress:     Therapeutic Modalities:   Cognitive Behavioral Therapy Solution-Focused Therapy Assertiveness Training Relapse Prevention Therapy   Devona Konig, LCSW 10/02/2017 3:01 PM

## 2017-10-02 NOTE — Anesthesia Preprocedure Evaluation (Signed)
Anesthesia Evaluation  Patient identified by MRN, date of birth, ID band Patient awake    Reviewed: Allergy & Precautions, H&P , NPO status , Patient's Chart, lab work & pertinent test results, reviewed documented beta blocker date and time   History of Anesthesia Complications (+) PONV, Family history of anesthesia reaction and history of anesthetic complications  Airway Mallampati: II  TM Distance: >3 FB Neck ROM: full   Comment: Patient not cooperative Dental  (+) Poor Dentition, Dental Advidsory Given   Pulmonary neg pulmonary ROS,           Cardiovascular Exercise Tolerance: Good hypertension, (-) angina(-) CAD, (-) Past MI, (-) Cardiac Stents and (-) CABG (-) dysrhythmias + Valvular Problems/Murmurs      Neuro/Psych  Headaches, neg Seizures PSYCHIATRIC DISORDERS Bipolar Disorder    GI/Hepatic Neg liver ROS, hiatal hernia, GERD  ,  Endo/Other  negative endocrine ROS  Renal/GU ESRF and DialysisRenal disease  negative genitourinary   Musculoskeletal   Abdominal   Peds  Hematology negative hematology ROS (+)   Anesthesia Other Findings Past Medical History: No date: Anal fissure No date: Bipolar affective disorder (HCC) No date: CKD (chronic kidney disease)     Comment:  Dr Holley Raring [name] 4 No date: Colon polyps No date: Diverticulitis No date: Diverticulitis No date: Family history of adverse reaction to anesthesia     Comment:  mom - PONV No date: GERD (gastroesophageal reflux disease) No date: Headache     Comment:  migraines - none over 10 yrs No date: Heart murmur No date: History of hiatal hernia No date: Hypertension No date: Pancreatitis     Comment:  Valproic acid No date: Vertigo   Reproductive/Obstetrics negative OB ROS                             Anesthesia Physical  Anesthesia Plan  ASA: IV  Anesthesia Plan: General   Post-op Pain Management:    Induction:  Intravenous  PONV Risk Score and Plan: 4 or greater and Treatment may vary due to age or medical condition  Airway Management Planned: Nasal Cannula  Additional Equipment:   Intra-op Plan:   Post-operative Plan:   Informed Consent: I have reviewed the patients History and Physical, chart, labs and discussed the procedure including the risks, benefits and alternatives for the proposed anesthesia with the patient or authorized representative who has indicated his/her understanding and acceptance.   Dental Advisory Given  Plan Discussed with: CRNA  Anesthesia Plan Comments:         Anesthesia Quick Evaluation

## 2017-10-02 NOTE — Progress Notes (Signed)
Post dialyisis assessment

## 2017-10-02 NOTE — Progress Notes (Signed)
HD DISCONTINUED DT EXCESSIVE VENOUS PRESSURES.

## 2017-10-02 NOTE — Progress Notes (Signed)
Hd sTARTED

## 2017-10-02 NOTE — Progress Notes (Signed)
[facility]  ROUNDING NOTE   Subjective:   Seen and examined on hemodialysis. Lethargic and slow to answer questions.   Venous pressures are increasing and blood flow rate has to be reduced.  ECT earlier today.     HEMODIALYSIS FLOWSHEET:  Blood Flow Rate (mL/min): 280 mL/min Arterial Pressure (mmHg): -240 mmHg Venous Pressure (mmHg): 260 mmHg Transmembrane Pressure (mmHg): 70 mmHg Ultrafiltration Rate (mL/min): 340 mL/min Dialysate Flow Rate (mL/min): 600 ml/min Conductivity: Machine : 14.1 Conductivity: Machine : 14.1 Dialysis Fluid Bolus: Normal Saline Bolus Amount (mL): 200 mL Dialysate Change: (3k 2.5ca)    Objective:  Vital signs in last 24 hours:  Temp:  [97.9 F (36.6 C)-98.8 F (37.1 C)] 97.9 F (36.6 C) (03/15 1550) Pulse Rate:  [109-130] 113 (03/15 1715) Resp:  [17-23] 17 (03/15 1715) BP: (89-160)/(47-82) 98/57 (03/15 1715) SpO2:  [91 %-98 %] 97 % (03/15 1537)  Weight change:  Filed Weights    Intake/Output: I/O last 3 completed shifts: In: 68 [P.O.:60] Out: -    Intake/Output this shift:  Total I/O In: 120 [P.O.:120] Out: -   Physical Exam: General: NAD, laying in bed  Head: Normocephalic, atraumatic. Moist oral mucosal membranes  Eyes: Anicteric  Neck: Supple, trachea midline  Lungs:  Clear to auscultation  Heart: Regular rate and rhythm  Abdomen:  Soft, nontender  Extremities: no peripheral edema.  Psych:  Flat affect. Slow to respond  Neurologic: Nonfocal, moving all four extremities  Skin: No lesions  Access: Left AVF    Basic Metabolic Panel: Recent Labs  Lab 09/28/17 1335 10/01/17 0711 10/02/17 1559  NA 138 141 143  K 4.0 4.0 4.1  CL 96* 99* 101  CO2 27 23 22   GLUCOSE 108* 117* 113*  BUN 54* 51* 76*  CREATININE 7.19* 7.39* 9.74*  CALCIUM 9.6 10.3 9.7  PHOS 3.1 4.0 4.7*    Liver Function Tests: Recent Labs  Lab 09/28/17 1335 10/01/17 0711 10/02/17 1559  ALBUMIN 3.9 4.2 4.0   No results for  input(s): LIPASE, AMYLASE in the last 168 hours. Recent Labs  Lab 10/01/17 0711  AMMONIA 16    CBC: Recent Labs  Lab 09/28/17 1335 10/01/17 0711 10/02/17 1559  WBC 8.6 10.7 11.5*  HGB 11.9* 13.1 12.1  HCT 36.2 39.0 37.1  MCV 95.8 95.4 96.9  PLT 274 367 385    Cardiac Enzymes: No results for input(s): CKTOTAL, CKMB, CKMBINDEX, TROPONINI in the last 168 hours.  BNP: Invalid input(s): POCBNP  CBG: Recent Labs  Lab 10/02/17 0610  GLUCAP 104*    Microbiology: Results for orders placed or performed during the hospital encounter of 09/01/17  Urine culture     Status: Abnormal   Collection Time: 09/01/17  4:32 PM  Result Value Ref Range Status   Specimen Description   Final    URINE, RANDOM Performed at [facility], [address] [place], [place] [address]    Special Requests   Final    NONE Performed at [facility], [address] [place], [place] [address]    Culture MULTIPLE SPECIES PRESENT, SUGGEST RECOLLECTION (A)  Final   Report Status 09/03/2017 FINAL  Final  Culture, blood (routine x 2)     Status: None   Collection Time: 09/01/17  6:05 PM  Result Value Ref Range Status   Specimen Description BLOOD RIGHT HAND  Final   Special Requests   Final    BOTTLES DRAWN AEROBIC AND ANAEROBIC Blood Culture results may not be  optimal due to an excessive volume of blood received in culture bottles   Culture   Final    NO GROWTH 5 DAYS Performed at [facility], [address] [place], [place] [address]    Report Status 09/06/2017 FINAL  Final  Culture, blood (routine x 2)     Status: None   Collection Time: 09/01/17  6:34 PM  Result Value Ref Range Status   Specimen Description BLOOD RAC  Final   Special Requests   Final    BOTTLES DRAWN AEROBIC AND ANAEROBIC Blood Culture adequate volume   Culture   Final    NO GROWTH 5 DAYS Performed at [facility], [address], [place], [place] [address]    Report Status  09/06/2017 FINAL  Final    Coagulation Studies: No results for input(s): LABPROT, INR in the last 72 hours.  Urinalysis: No results for input(s): COLORURINE, LABSPEC, PHURINE, GLUCOSEU, HGBUR, BILIRUBINUR, KETONESUR, PROTEINUR, UROBILINOGEN, NITRITE, LEUKOCYTESUR in the last 72 hours.  Invalid input(s): APPERANCEUR    Imaging: No results found.   Medications:    . brimonidine  1 drop Both Eyes BID  . [START ON 10/17/2017] cyanocobalamin  1,000 mcg Intramuscular Q30 days  . dicyclomine  20 mg Oral TID AC  . lidocaine-prilocaine  1 application Topical Q M,W,F  . metoprolol tartrate  25 mg Oral BID  . midodrine  10 mg Oral TID WC  . multivitamin  1 tablet Oral QHS  . OLANZapine zydis  5 mg Oral TID  . pantoprazole  40 mg Oral Daily  . timolol  1 drop Both Eyes BID  . vitamin C  500 mg Oral BID   acetaminophen, alum & mag hydroxide-simeth, bisacodyl, haloperidol **OR** haloperidol lactate, magnesium hydroxide  Assessment/ Plan:  [name] is a 72 y.o. white female with end stage renal disease on hemodialysis, diverticulosis, hypotension, bipolar disorder, polycystic kidney disease   [facility]/MWF  1.  ESRD on HD MWF.  Seen and examined on hemodialysis. Unfortunately having difficulties with patient's fistula.  Continue MWF schedule.  If dialysis access device complications continue: will consult vascular.   2.  Anemia of CKD: hemoglobin 12.1 - Holding EPO  3.  Secondary hyperparathyroidism: Phosphorus at goal. PTH 150. Calcium elevated this morning.  Not currently on binders.   4. Hypotension:  continue midodrine prior to dialysis.   5. Delirium: doubtful this is uremic encephalopathy as BUN is low and patient has been getting some dialysis.  Ammonia level is normal.  - Check labs daily - Appreciate Psych input. ECT today.    LOS: [place] 3/15/20195:17 PM

## 2017-10-02 NOTE — Anesthesia Postprocedure Evaluation (Signed)
Anesthesia Post Note  Patient: Joyce [name]  Procedure(s) Performed: ECT TX  Patient location during evaluation: PACU Anesthesia Type: General Level of consciousness: awake and alert Pain management: pain level controlled Vital Signs Assessment: post-procedure vital signs reviewed and stable Respiratory status: spontaneous breathing, nonlabored ventilation, respiratory function stable and patient connected to nasal cannula oxygen Cardiovascular status: blood pressure returned to baseline and stable Postop Assessment: no apparent nausea or vomiting Anesthetic complications: no     Last Vitals:  Vitals:   10/02/17 1050 10/02/17 1108  BP: 132/70 (!) 142/74  Pulse: (!) 120 (!) 118  Resp: 18 19  Temp:  37.1 C  SpO2: 92% 93%    Last Pain:  Vitals:   10/02/17 1108  TempSrc:   PainSc: Asleep                 Martha Clan

## 2017-10-02 NOTE — Transfer of Care (Signed)
Immediate Anesthesia Transfer of Care Note  Patient: Joyce [name]  Procedure(s) Performed: ECT TX  Patient Location: PACU  Anesthesia Type:General  Level of Consciousness: sedated  Airway & Oxygen Therapy: Patient connected to face mask oxygen  Post-op Assessment: Post -op Vital signs reviewed and stable  Post vital signs: stable  Last Vitals:  Vitals:   10/02/17 0849 10/02/17 1034  BP: 136/80 (!) 153/82  Pulse: (!) 126 (!) 125  Resp: 18 (!) 23  Temp:  37 C  SpO2: 98% 98%    Last Pain:  Vitals:   10/02/17 1034  TempSrc: Temporal  PainSc:          Complications: No apparent anesthesia complications

## 2017-10-02 NOTE — Progress Notes (Signed)
0100 Nursing 1:1 note D:Pt observed sleeping in bed with eyes closed. RR even and unlabored. No distress noted.A: 1:1 observation continues for safety R: pt remains safe  0200 Nursing 1:1 note D:Pt observed sleeping in bed with eyes closed. RR even and unlabored. No distress noted.A: 1:1 observation continues for safety R: pt remains safe  0300  Nursing 1:1 note D:Pt observed sitting in bed. RR even and unlabored. No distress noted.A: 1:1 observation continues for safety R: pt remains safe    0400 Nursing 1:1 note D:Pt observed sleeping in bed . RR even and unlabored. No distress noted.A: 1:1 observation continues for safety R: pt remains safe  0500 Nursing 1:1 note D:Pt observed  Sleeping in bed . RR even and unlabored. No distress noted.A: 1:1 observation continues for safety R: pt remains safe  0615 Nursing 1:1 note D:Pt observed lying in bed . RR even and unlabored. No distress noted.A: 1:1 observation continues for safety R: pt remains safe

## 2017-10-02 NOTE — Progress Notes (Signed)
[facility] MD Progress Note  10/02/2017 5:52 PM Joyce [name]  MRN:  [id number] Subjective: Patient with bipolar disorder currently in a severe episode with depression with near catatonic features.  Patient had ECT treatment this morning.  Bilateral treatment tolerated without difficulty.  This afternoon the patient was speaking a little bit more and did consume a little bit of liquid and food.  Still disorganized and difficult to understand. Principal Problem: Bipolar affective disorder, current episode manic with psychotic symptoms ([place]) Diagnosis:   Patient Active Problem List   Diagnosis Date Noted  . Palliative care encounter [Z51.5]   . Bipolar affective disorder, current episode depressed with psychotic symptoms ([place]) [F31.2] 09/17/2017  . Bipolar I disorder, most recent episode (or current) manic ([place]) [F31.10] 09/02/2017  . Acute delirium [R41.0] 09/02/2017  . Altered mental status [R41.82]   . Acute encephalopathy [G93.40] 09/01/2017  . ESRD on dialysis ([place]) [N18.6, Z99.2] 04/06/2017  . Complication of vascular access for dialysis [T82.9XXA] 04/06/2017  . Hematuria [R31.9] 12/18/2016  . Polycystic kidney [Q61.3] 10/12/2016  . Ruptured cyst of kidney [Q61.00] 10/03/2016  . HTN (hypertension) [I10] 10/01/2016  . Chronic kidney disease [N18.9] 10/01/2016  . Anemia associated with chronic renal failure [D63.1] 09/24/2016  . Acute kidney insufficiency [N28.9] 07/28/2016  . Noninfectious diarrhea [K52.9]   . Benign neoplasm of cecum [D12.0]   . Benign neoplasm of ascending colon [D12.2]   . Diarrhea [R19.7]   . Nausea [R11.0]   . Gastric polyp [K31.7]   . Chronic constipation [K59.09] 12/27/2014  . Nausea with vomiting [R11.2] 12/27/2014  . Left sided abdominal pain [R10.9] 12/27/2014  . Personal history of colonic polyps [Z86.010] 05/10/2013   Total Time spent with patient: 30 minutes  Past Psychiatric History: Long history of bipolar disorder with recent decompensations  especially since beginning dialysis treatment  Past Medical History:  Past Medical History:  Diagnosis Date  . Anal fissure   . Bipolar affective disorder ([place])   . CKD (chronic kidney disease)    Dr Holley Raring [name] 4  . Colon polyps   . Diverticulitis   . Diverticulitis   . Family history of adverse reaction to anesthesia    mom - PONV  . GERD (gastroesophageal reflux disease)   . Headache    migraines - none over 10 yrs  . Heart murmur   . History of hiatal hernia   . Hypertension   . Pancreatitis    Valproic acid  . Vertigo     Past Surgical History:  Procedure Laterality Date  . A/V FISTULAGRAM Left 04/14/2017   Procedure: A/V Fistulagram;  Surgeon: Katha Cabal, MD;  Location: [place] [facility];  Service: Cardiovascular;  Laterality: Left;  . A/V FISTULAGRAM Left 06/09/2017   Procedure: A/V FISTULAGRAM;  Surgeon: Katha Cabal, MD;  Location: [place] [facility];  Service: Cardiovascular;  Laterality: Left;  . ABDOMINAL HYSTERECTOMY  1990 ?  . AV FISTULA PLACEMENT  4/30  . BREAST EXCISIONAL BIOPSY Left 1994   neg surgical bx  . CHOLECYSTECTOMY  2003  . COLONOSCOPY  2014   Dr. Jamal Collin  . COLONOSCOPY WITH PROPOFOL N/A 09/24/2015   Procedure: COLONOSCOPY WITH random colon byopies.;  Surgeon: Lucilla Lame, MD;  Location: [place];  Service: Endoscopy;  Laterality: N/A;  . ESOPHAGOGASTRODUODENOSCOPY (EGD) WITH PROPOFOL N/A 09/24/2015   Procedure: ESOPHAGOGASTRODUODENOSCOPY (EGD) ;  Surgeon: Lucilla Lame, MD;  Location: [place];  Service: Endoscopy;  Laterality: N/A;  . EYE SURGERY    .  PERIPHERAL VASCULAR CATHETERIZATION N/A 05/29/2015   Procedure: A/V Shuntogram/Fistulagram;  Surgeon: Katha Cabal, MD;  Location: [place] [facility];  Service: Cardiovascular;  Laterality: N/A;  . PERIPHERAL VASCULAR CATHETERIZATION N/A 05/29/2015   Procedure: A/V Shunt Intervention;  Surgeon: Katha Cabal, MD;  Location: [place] [facility];   Service: Cardiovascular;  Laterality: N/A;  . POLYPECTOMY  09/24/2015   Procedure: POLYPECTOMY INTESTINAL;  Surgeon: Lucilla Lame, MD;  Location: [place];  Service: Endoscopy;;  cecal polyp ascending polyp   Family History:  Family History  Problem Relation Age of Onset  . Stroke Father   . Hypertension Father   . Breast cancer Other   . Colon cancer Neg Hx   . Liver disease Neg Hx    Family Psychiatric  History: None Social History:  Social History   Substance and Sexual Activity  Alcohol Use No  . Alcohol/week: 0.0 oz     Social History   Substance and Sexual Activity  Drug Use No    Social History   Socioeconomic History  . Marital status: Single    Spouse name: None  . Number of children: 1  . Years of education: None  . Highest education level: None  Social Needs  . Financial resource strain: None  . Food insecurity - worry: None  . Food insecurity - inability: None  . Transportation needs - medical: None  . Transportation needs - non-medical: None  Occupational History  . Occupation: Surveyor, quantity: [employer]  Tobacco Use  . Smoking status: Never Smoker  . Smokeless tobacco: Never Used  Substance and Sexual Activity  . Alcohol use: No    Alcohol/week: 0.0 oz  . Drug use: No  . Sexual activity: Not Currently  Other Topics Concern  . 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   Additional Social History:    History of alcohol / drug use?: No history of alcohol / drug abuse                    Sleep: Fair  Appetite:  Poor  Current Medications: Current Facility-Administered Medications  Medication Dose Route Frequency Provider Last Rate Last Dose  . acetaminophen (TYLENOL) tablet 325 mg  325 mg Oral BID PRN Rien Marland, [name], MD   325 mg at 09/27/17 1225  . alum & mag hydroxide-simeth (MAALOX/MYLANTA) 200-200-20 MG/5ML suspension 30 mL  30 mL Oral  Q4H PRN Arabell Neria T, MD      . bisacodyl (DULCOLAX) EC tablet 5 mg  5 mg Oral Daily PRN Sevastian Witczak T, MD      . brimonidine (ALPHAGAN) 0.2 % ophthalmic solution 1 drop  1 drop Both Eyes BID Abdel Effinger, [name], MD   1 drop at 09/29/17 0850  . [START ON 10/17/2017] cyanocobalamin ((VITAMIN B-12)) injection 1,000 mcg  1,000 mcg Intramuscular Q30 days Elray Dains T, MD      . dicyclomine (BENTYL) tablet 20 mg  20 mg Oral TID AC Merrilyn Legler T, MD   20 mg at 10/02/17 1235  . haloperidol (HALDOL) tablet 5 mg  5 mg Oral Q8H PRN [name], [name], MD       Or  . haloperidol lactate (HALDOL) injection 5 mg  5 mg Intramuscular Q8H PRN [name], [name], MD   5 mg at 10/01/17 0908  . lidocaine-prilocaine (EMLA) cream 1 application  1 application Topical Q M,W,F Maryela Tapper T, MD      . magnesium hydroxide (MILK OF MAGNESIA) suspension 30 mL  30 mL Oral Daily PRN Jatoria Kneeland T, MD      . metoprolol tartrate (LOPRESSOR) tablet 25 mg  25 mg Oral BID Gladstone Lighter, MD      . midodrine (PROAMATINE) tablet 10 mg  10 mg Oral TID WC [name], MD   10 mg at 10/02/17 1235  . multivitamin (RENA-VIT) tablet 1 tablet  1 tablet Oral QHS [name], [name], MD   1 tablet at 09/30/17 2038  . OLANZapine zydis (ZYPREXA) disintegrating tablet 5 mg  5 mg Oral TID [name], [name], MD   5 mg at 10/02/17 1235  . pantoprazole (PROTONIX) EC tablet 40 mg  40 mg Oral Daily Estanislado Surgeon, [name], MD   40 mg at 10/02/17 1236  . timolol (TIMOPTIC) 0.5 % ophthalmic solution 1 drop  1 drop Both Eyes BID Kenzlee Fishburn, [name], MD   1 drop at 09/29/17 0850  . vitamin C (ASCORBIC ACID) tablet 500 mg  500 mg Oral BID [name], [name], MD   500 mg at 10/02/17 1235    Lab Results:  Results for orders placed or performed during the hospital encounter of 09/17/17 (from the past 48 hour(s))  CBC     Status: None   Collection Time: 10/01/17  7:11 AM  Result Value Ref Range   WBC 10.7 3.6 - 11.0 K/uL   RBC 4.09  3.80 - 5.20 MIL/uL   Hemoglobin 13.1 12.0 - 16.0 g/dL   HCT 39.0 35.0 - 47.0 %   MCV 95.4 80.0 - 100.0 fL   MCH 32.0 26.0 - 34.0 pg   MCHC 33.5 32.0 - 36.0 g/dL   RDW 14.0 11.5 - 14.5 %   Platelets 367 150 - 440 K/uL    Comment: Performed at [facility], [address] [place], [place] [address]  Renal function panel     Status: Abnormal   Collection Time: 10/01/17  7:11 AM  Result Value Ref Range   Sodium 141 135 - 145 mmol/L   Potassium 4.0 3.5 - 5.1 mmol/L   Chloride 99 (L) 101 - 111 mmol/L   CO2 23 22 - 32 mmol/L   Glucose, Bld 117 (H) 65 - 99 mg/dL   BUN 51 (H) 6 - 20 mg/dL   Creatinine, Ser 7.39 (H) 0.44 - 1.00 mg/dL   Calcium 10.3 8.9 - 10.3 mg/dL   Phosphorus 4.0 2.5 - 4.6 mg/dL   Albumin 4.2 3.5 - 5.0 g/dL   GFR calc non Af Amer 5 (L) >60 mL/min   GFR calc Af Amer 6 (L) >60 mL/min    Comment: (NOTE) The eGFR has been calculated using the CKD EPI equation. This calculation has not been validated in all clinical situations. eGFR's persistently <60 mL/min signify possible Chronic Kidney Disease.    Anion gap 19 (H) 5 - 15    Comment: Performed at [facility], [address] [place], [place] [address]  Ammonia     Status: None   Collection Time: 10/01/17  7:11 AM  Result Value Ref Range   Ammonia 16 9 - 35 umol/L    Comment: Performed at [facility], [address] [place], [place] [address]  Glucose, capillary     Status: Abnormal   Collection Time: 10/02/17  6:10 AM  Result Value Ref Range   Glucose-Capillary 104 (H) 65 -  99 mg/dL  Renal function panel     Status: Abnormal   Collection Time: 10/02/17  3:59 PM  Result Value Ref Range   Sodium 143 135 - 145 mmol/L   Potassium 4.1 3.5 - 5.1 mmol/L   Chloride 101 101 - 111 mmol/L   CO2 22 22 - 32 mmol/L   Glucose, Bld 113 (H) 65 - 99 mg/dL   BUN 76 (H) 6 - 20 mg/dL   Creatinine, Ser 9.74 (H) 0.44 - 1.00 mg/dL   Calcium 9.7 8.9 - 10.3 mg/dL   Phosphorus 4.7 (H) 2.5 - 4.6 mg/dL    Albumin 4.0 3.5 - 5.0 g/dL   GFR calc non Af Amer 4 (L) >60 mL/min   GFR calc Af Amer 4 (L) >60 mL/min    Comment: (NOTE) The eGFR has been calculated using the CKD EPI equation. This calculation has not been validated in all clinical situations. eGFR's persistently <60 mL/min signify possible Chronic Kidney Disease.    Anion gap 20 (H) 5 - 15    Comment: Performed at [facility], [address] [place], [place] [address]  CBC     Status: Abnormal   Collection Time: 10/02/17  3:59 PM  Result Value Ref Range   WBC 11.5 (H) 3.6 - 11.0 K/uL   RBC 3.83 3.80 - 5.20 MIL/uL   Hemoglobin 12.1 12.0 - 16.0 g/dL   HCT 37.1 35.0 - 47.0 %   MCV 96.9 80.0 - 100.0 fL   MCH 31.5 26.0 - 34.0 pg   MCHC 32.5 32.0 - 36.0 g/dL   RDW 14.4 11.5 - 14.5 %   Platelets 385 150 - 440 K/uL    Comment: Performed at [facility], [address] [place], [place] [address]    Blood Alcohol level:  Lab Results  Component Value Date   [facility] <10 74/25/9563    Metabolic Disorder Labs: Lab Results  Component Value Date   HGBA1C 4.8 09/18/2017   MPG 91.06 09/18/2017   No results found for: PROLACTIN Lab Results  Component Value Date   CHOL 251 (H) 09/18/2017   TRIG 124 09/18/2017   HDL 66 09/18/2017   CHOLHDL 3.8 09/18/2017   VLDL 25 09/18/2017   LDLCALC 160 (H) 09/18/2017   LDLCALC 43 05/14/2014    Physical Findings: AIMS: Facial and Oral Movements Muscles of Facial Expression: None, normal Lips and Perioral Area: None, normal Jaw: None, normal Tongue: None, normal,Extremity Movements Upper (arms, wrists, hands, fingers): None, normal Lower (legs, knees, ankles, toes): None, normal, Trunk Movements Neck, shoulders, hips: None, normal, Overall Severity Severity of abnormal movements (highest score from questions above): None, normal Incapacitation due to abnormal movements: None, normal Patient's awareness of abnormal movements (rate only patient's report): No Awareness,  Dental Status Current problems with teeth and/or dentures?: No Does patient usually wear dentures?: No  CIWA:    COWS:     Musculoskeletal: Strength & Muscle Tone: decreased Gait & Station: unable to stand Patient leans: N/A  Psychiatric Specialty Exam: Physical Exam  Nursing note and vitals reviewed. Constitutional: She appears well-developed and well-nourished.  HENT:  Head: Normocephalic and atraumatic.  Eyes: Conjunctivae are normal. Pupils are equal, round, and reactive to light.  Neck: Normal range of motion.  Cardiovascular: Regular rhythm and normal heart sounds.  Respiratory: Effort normal. No respiratory distress.  GI: Soft.  Musculoskeletal: Normal range of motion.  Neurological: She is alert.  Skin: Skin is warm and dry.  Psychiatric: Her affect is  blunt. Her speech is delayed. She is slowed and withdrawn. Cognition and memory are impaired. She expresses inappropriate judgment.    Review of Systems  Unable to perform ROS: Mental status change    Blood pressure 120/73, pulse (!) 117, temperature 99.1 F (37.3 C), temperature source Axillary, resp. rate 19, height '5\' 3"'$  (1.6 m), weight 72.6 kg (160 lb), SpO2 97 %.Body mass index is 28.34 kg/m.  General Appearance: Disheveled  Eye Contact:  Minimal  Speech:  Garbled, Slow and Slurred  Volume:  Decreased  Mood:  Dysphoric  Affect:  Constricted  Thought Process:  Disorganized  Orientation:  Negative  Thought Content:  Illogical  Suicidal Thoughts:  No  Homicidal Thoughts:  No  Memory:  Immediate;   Poor Recent;   Poor Remote;   Poor  Judgement:  Poor  Insight:  Lacking  Psychomotor Activity:  Decreased  Concentration:  Concentration: Poor  Recall:  Poor  Fund of Knowledge:  Poor  Language:  Poor  Akathisia:  No  Handed:  Right  AIMS (if indicated):     Assets:  Social Support  ADL's:  Impaired  Cognition:  Impaired,  Severe  Sleep:  Number of Hours: 5     Treatment Plan Summary: Daily contact  with patient to assess and evaluate symptoms and progress in treatment, Medication management and Plan Patient beginning ECT treatment for delirium, or catatonia as part of severe depression and bipolar disorder.  Patient had treatment today and tolerated it without difficulty.  We are holding off on Tegretol for now.  Continue other medication.  Continue encouraging patient to eat and drink.  Please continue dialysis and medical follow-up.  Alethia Berthold, MD 10/02/2017, 5:52 PM

## 2017-10-02 NOTE — Tx Team (Signed)
Interdisciplinary Treatment and Diagnostic Plan Update  10/02/2017 Time of Session: 2:30PM Keerthana Vanrossum MRN: [id number]  Principal Diagnosis: Bipolar affective disorder, current episode manic with psychotic symptoms ([place])  Secondary Diagnoses: Principal Problem:   Bipolar affective disorder, current episode depressed with psychotic symptoms ([place]) Active Problems:   ESRD on dialysis [facility])   Palliative care encounter   Current Medications:  Current Facility-Administered Medications  Medication Dose Route Frequency Provider Last Rate Last Dose  . acetaminophen (TYLENOL) tablet 325 mg  325 mg Oral BID PRN Clapacs, [name], MD   325 mg at 09/27/17 1225  . alum & mag hydroxide-simeth (MAALOX/MYLANTA) 200-200-20 MG/5ML suspension 30 mL  30 mL Oral Q4H PRN Clapacs, [name], MD      . bisacodyl (DULCOLAX) EC tablet 5 mg  5 mg Oral Daily PRN Clapacs, [name], MD      . brimonidine (ALPHAGAN) 0.2 % ophthalmic solution 1 drop  1 drop Both Eyes BID Clapacs, [name], MD   1 drop at 09/29/17 0850  . [START ON 10/17/2017] cyanocobalamin ((VITAMIN B-12)) injection 1,000 mcg  1,000 mcg Intramuscular Q30 days Clapacs, [name], MD      . dicyclomine (BENTYL) tablet 20 mg  20 mg Oral TID AC Clapacs, [name], MD   20 mg at 10/02/17 1235  . haloperidol (HALDOL) tablet 5 mg  5 mg Oral Q8H PRN [name], [name], MD       Or  . haloperidol lactate (HALDOL) injection 5 mg  5 mg Intramuscular Q8H PRN [name], [name], MD   5 mg at 10/01/17 0908  . lidocaine-prilocaine (EMLA) cream 1 application  1 application Topical Q M,W,F Clapacs, [name], MD      . magnesium hydroxide (MILK OF MAGNESIA) suspension 30 mL  30 mL Oral Daily PRN Clapacs, [name], MD      . metoprolol tartrate (LOPRESSOR) tablet 25 mg  25 mg Oral BID Gladstone Lighter, MD      . midodrine (PROAMATINE) tablet 10 mg  10 mg Oral TID WC [name], MD   10 mg at 10/02/17 1235  . multivitamin (RENA-VIT) tablet 1 tablet  1 tablet Oral QHS  [name], [name], MD   1 tablet at 09/30/17 2038  . OLANZapine zydis (ZYPREXA) disintegrating tablet 5 mg  5 mg Oral TID [name], [name], MD   5 mg at 10/02/17 1235  . pantoprazole (PROTONIX) EC tablet 40 mg  40 mg Oral Daily Clapacs, [name], MD   40 mg at 10/02/17 1236  . timolol (TIMOPTIC) 0.5 % ophthalmic solution 1 drop  1 drop Both Eyes BID Clapacs, [name], MD   1 drop at 09/29/17 0850  . vitamin C (ASCORBIC ACID) tablet 500 mg  500 mg Oral BID [name], [name], MD   500 mg at 10/02/17 1235   PTA Medications: Medications Prior to Admission  Medication Sig Dispense Refill Last Dose  . acetaminophen (TYLENOL) 325 MG tablet Take 325 mg 2 (two) times daily as needed by mouth for moderate pain or headache.    10/01/2017  . ARIPiprazole (ABILIFY) 2 MG tablet Take 2 mg by mouth daily.   10/01/2017  . bisacodyl (DULCOLAX) 5 MG EC tablet Take 1 tablet (5 mg total) by mouth daily as needed for moderate constipation. 30 tablet 0 10/01/2017  . brimonidine (ALPHAGAN) 0.2 % ophthalmic solution Place 2 (two) times daily into both eyes.   10/01/2017  . carbamazepine (TEGRETOL) 200 MG tablet Take 200  mg by mouth 2 (two) times daily.   10/01/2017  . cephALEXin (KEFLEX) 500 MG capsule Take 1 capsule (500 mg total) by mouth every 12 (twelve) hours. 5 capsule 0 10/01/2017  . cholecalciferol (VITAMIN D) 1000 UNITS tablet Take 1,000 Units by mouth daily.   10/01/2017  . cyanocobalamin (,VITAMIN B-12,) 1000 MCG/ML injection Inject 1,000 mcg into the muscle every 30 (thirty) days.   10/01/2017  . dicyclomine (BENTYL) 20 MG tablet Take 1 tablet (20 mg total) by mouth 3 (three) times daily before meals. 30 tablet 0 10/01/2017  . lidocaine-prilocaine (EMLA) cream Apply 1 application every Monday, Wednesday, and Friday topically. At dialysis  3 10/01/2017  . midodrine (PROAMATINE) 10 MG tablet Take 10 mg by mouth daily.    10/01/2017  . pantoprazole (PROTONIX) 40 MG tablet TAKE 1 TABLET EVERY DAY 90 tablet 3  10/01/2017  . pramipexole (MIRAPEX) 0.125 MG tablet Take 0.125 mg by mouth daily at 2 PM.  3 10/01/2017  . QUEtiapine (SEROQUEL) 25 MG tablet Take 50 mg by mouth at bedtime.    10/01/2017  . timolol (TIMOPTIC) 0.5 % ophthalmic solution Place 1 drop 2 (two) times daily into both eyes.    10/01/2017    Patient Stressors: Health problems  Patient Strengths: Ability for insight Active sense of humor Average or above average intelligence Capable of independent living Occupational psychologist fund of knowledge Supportive family/friends  Treatment Modalities: Medication Management, Group therapy, Case management,  1 to 1 session with clinician, Psychoeducation, Recreational therapy.   Physician Treatment Plan for Primary Diagnosis: Bipolar affective disorder, current episode manic with psychotic symptoms ([place]) Long Term Goal(s): Improvement in symptoms so as ready for discharge NA   Short Term Goals: Ability to identify changes in lifestyle to reduce recurrence of condition will improve Ability to verbalize feelings will improve Ability to disclose and discuss suicidal ideas Ability to demonstrate self-control will improve Ability to identify and develop effective coping behaviors will improve Ability to identify triggers associated with substance abuse/mental health issues will improve NA  Medication Management: Evaluate patient's response, side effects, and tolerance of medication regimen.  Therapeutic Interventions: 1 to 1 sessions, Unit Group sessions and Medication administration.  Evaluation of Outcomes: Progressing  Physician Treatment Plan for Secondary Diagnosis: Principal Problem:   Bipolar affective disorder, current episode depressed with psychotic symptoms ([place]) Active Problems:   ESRD on dialysis [facility])   Palliative care encounter  Long Term Goal(s): Improvement in symptoms so as ready for discharge NA   Short Term Goals: Ability to identify  changes in lifestyle to reduce recurrence of condition will improve Ability to verbalize feelings will improve Ability to disclose and discuss suicidal ideas Ability to demonstrate self-control will improve Ability to identify and develop effective coping behaviors will improve Ability to identify triggers associated with substance abuse/mental health issues will improve NA     Medication Management: Evaluate patient's response, side effects, and tolerance of medication regimen.  Therapeutic Interventions: 1 to 1 sessions, Unit Group sessions and Medication administration.  Evaluation of Outcomes: Progressing   RN Treatment Plan for Primary Diagnosis: Bipolar affective disorder, current episode manic with psychotic symptoms ([place]) Long Term Goal(s): Knowledge of disease and therapeutic regimen to maintain health will improve  Short Term Goals: Ability to demonstrate self-control and Compliance with prescribed medications will improve  Medication Management: RN will administer medications as ordered by provider, will assess and evaluate patient's response and provide education to patient for prescribed medication. RN will  report any adverse and/or side effects to prescribing provider.  Therapeutic Interventions: 1 on 1 counseling sessions, Psychoeducation, Medication administration, Evaluate responses to treatment, Monitor vital signs and CBGs as ordered, Perform/monitor CIWA, COWS, AIMS and Fall Risk screenings as ordered, Perform wound care treatments as ordered.  Evaluation of Outcomes: Progressing   LCSW Treatment Plan for Primary Diagnosis: Bipolar affective disorder, current episode manic with psychotic symptoms ([place]) Long Term Goal(s): Safe transition to appropriate next level of care at discharge, Engage patient in therapeutic group addressing interpersonal concerns.  Short Term Goals: Engage patient in aftercare planning with referrals and resources, Increase social support and  Increase skills for wellness and recovery  Therapeutic Interventions: Assess for all discharge needs, 1 to 1 time with Social worker, Explore available resources and support systems, Assess for adequacy in community support network, Educate family and significant other(s) on suicide prevention, Complete Psychosocial Assessment, Interpersonal group therapy.  Evaluation of Outcomes: Not Progressing   Progress in Treatment: Attending groups: No. Participating in groups: No. Taking medication as prescribed: Yes. Toleration medication: Yes. Family/Significant other contact made: No, will contact:  Patient refused family contact Patient understands diagnosis: Yes. Discussing patient identified problems/goals with staff: Yes. Medical problems stabilized or resolved: Yes. Denies suicidal/homicidal ideation: Yes. Issues/concerns per patient self-inventory: No. Other:   New problem(s) identified: No, Describe:  None  New Short Term/Long Term Goal(s): "To be able to focus and trust a person and not doubt my doctor."  Discharge Plan or Barriers: To return home and follow up with establish provider.   Reason for Continuation of Hospitalization: Medical Issues Medication stabilization  Estimated Length of Stay: 7 days  Attendees: Patient:  10/02/2017 3:55 PM  Physician: Dr. Alethia Berthold, MD 10/02/2017 3:55 PM  Nursing:  10/02/2017 3:55 PM  RN Care Manager:  10/02/2017 3:55 PM  Social Worker: Darin Engels, [place] 10/02/2017 3:55 PM  Recreational Therapist:  10/02/2017 3:55 PM  Other:  10/02/2017 3:55 PM  Other:  10/02/2017 3:55 PM  Other: 10/02/2017 3:55 PM    Scribe for Treatment Team: Darin Engels, LCSW 10/02/2017 3:55 PM

## 2017-10-02 NOTE — Anesthesia Post-op Follow-up Note (Signed)
Anesthesia QCDR form completed.        

## 2017-10-02 NOTE — Anesthesia Procedure Notes (Signed)
Procedure Name: General with mask airway Date/Time: 10/02/2017 10:13 AM Performed by: Aline Brochure, CRNA Pre-anesthesia Checklist: Patient identified, Emergency Drugs available, Suction available and Patient being monitored Patient Re-evaluated:Patient Re-evaluated prior to induction Oxygen Delivery Method: Circle system utilized Preoxygenation: Pre-oxygenation with 100% oxygen Induction Type: IV induction Ventilation: Mask ventilation without difficulty Airway Equipment and Method: Bite block

## 2017-10-02 NOTE — OR Nursing (Signed)
Patient appears to be asleep now vss

## 2017-10-02 NOTE — H&P (Signed)
Joyce [name] is an 72 y.o. female.   Chief Complaint: No chief complaint HPI: Patient with bipolar disorder currently in a severe depression with psychotic and near catatonic features  Past Medical History:  Diagnosis Date  . Anal fissure   . Bipolar affective disorder ([place])   . CKD (chronic kidney disease)    Dr Holley Raring [name] 4  . Colon polyps   . Diverticulitis   . Diverticulitis   . Family history of adverse reaction to anesthesia    mom - PONV  . GERD (gastroesophageal reflux disease)   . Headache    migraines - none over 10 yrs  . Heart murmur   . History of hiatal hernia   . Hypertension   . Pancreatitis    Valproic acid  . Vertigo     Past Surgical History:  Procedure Laterality Date  . A/V FISTULAGRAM Left 04/14/2017   Procedure: A/V Fistulagram;  Surgeon: Katha Cabal, MD;  Location: [facility];  Service: Cardiovascular;  Laterality: Left;  . A/V FISTULAGRAM Left 06/09/2017   Procedure: A/V FISTULAGRAM;  Surgeon: Katha Cabal, MD;  Location: [facility];  Service: Cardiovascular;  Laterality: Left;  . ABDOMINAL HYSTERECTOMY  1990 ?  . AV FISTULA PLACEMENT  4/30  . BREAST EXCISIONAL BIOPSY Left 1994   neg surgical bx  . CHOLECYSTECTOMY  2003  . COLONOSCOPY  2014   Dr. Jamal Collin  . COLONOSCOPY WITH PROPOFOL N/A 09/24/2015   Procedure: COLONOSCOPY WITH random colon byopies.;  Surgeon: Lucilla Lame, MD;  Location: [facility];  Service: Endoscopy;  Laterality: N/A;  . ESOPHAGOGASTRODUODENOSCOPY (EGD) WITH PROPOFOL N/A 09/24/2015   Procedure: ESOPHAGOGASTRODUODENOSCOPY (EGD) ;  Surgeon: Lucilla Lame, MD;  Location: [facility];  Service: Endoscopy;  Laterality: N/A;  . EYE SURGERY    . PERIPHERAL VASCULAR CATHETERIZATION N/A 05/29/2015   Procedure: A/V Shuntogram/Fistulagram;  Surgeon: Katha Cabal, MD;  Location: [facility];  Service: Cardiovascular;  Laterality: N/A;  . PERIPHERAL VASCULAR CATHETERIZATION N/A  05/29/2015   Procedure: A/V Shunt Intervention;  Surgeon: Katha Cabal, MD;  Location: [facility];  Service: Cardiovascular;  Laterality: N/A;  . POLYPECTOMY  09/24/2015   Procedure: POLYPECTOMY INTESTINAL;  Surgeon: Lucilla Lame, MD;  Location: [facility];  Service: Endoscopy;;  cecal polyp ascending polyp    Family History  Problem Relation Age of Onset  . Stroke Father   . Hypertension Father   . Breast cancer Other   . Colon cancer Neg Hx   . Liver disease Neg Hx    Social History:  reports that  has never smoked. she has never used smokeless tobacco. She reports that she does not drink alcohol or use drugs.  Allergies:  Allergies  Allergen Reactions  . Morphine And Related Shortness Of Breath    Pt reports chest pain and difficulty breathing.  . Indomethacin Hives  . Pollen Extract Other (See Comments)    Sinus problems and HA    Medications Prior to Admission  Medication Sig Dispense Refill  . acetaminophen (TYLENOL) 325 MG tablet Take 325 mg 2 (two) times daily as needed by mouth for moderate pain or headache.     . ARIPiprazole (ABILIFY) 2 MG tablet Take 2 mg by mouth daily.    . bisacodyl (DULCOLAX) 5 MG EC tablet Take 1 tablet (5 mg total) by mouth daily as needed for moderate constipation. 30 tablet 0  . brimonidine (ALPHAGAN) 0.2 % ophthalmic  solution Place 2 (two) times daily into both eyes.    . carbamazepine (TEGRETOL) 200 MG tablet Take 200 mg by mouth 2 (two) times daily.    . cephALEXin (KEFLEX) 500 MG capsule Take 1 capsule (500 mg total) by mouth every 12 (twelve) hours. 5 capsule 0  . cholecalciferol (VITAMIN D) 1000 UNITS tablet Take 1,000 Units by mouth daily.    . cyanocobalamin (,VITAMIN B-12,) 1000 MCG/ML injection Inject 1,000 mcg into the muscle every 30 (thirty) days.    Marland Kitchen dicyclomine (BENTYL) 20 MG tablet Take 1 tablet (20 mg total) by mouth 3 (three) times daily before meals. 30 tablet 0  . lidocaine-prilocaine (EMLA) cream  Apply 1 application every Monday, Wednesday, and Friday topically. At dialysis  3  . midodrine (PROAMATINE) 10 MG tablet Take 10 mg by mouth daily.     . pantoprazole (PROTONIX) 40 MG tablet TAKE 1 TABLET EVERY DAY 90 tablet 3  . pramipexole (MIRAPEX) 0.125 MG tablet Take 0.125 mg by mouth daily at 2 PM.  3  . QUEtiapine (SEROQUEL) 25 MG tablet Take 50 mg by mouth at bedtime.     . timolol (TIMOPTIC) 0.5 % ophthalmic solution Place 1 drop 2 (two) times daily into both eyes.       Results for orders placed or performed during the hospital encounter of 09/17/17 (from the past 48 hour(s))  CBC     Status: None   Collection Time: 10/01/17  7:11 AM  Result Value Ref Range   WBC 10.7 3.6 - 11.0 K/uL   RBC 4.09 3.80 - 5.20 MIL/uL   Hemoglobin 13.1 12.0 - 16.0 g/dL   HCT 39.0 35.0 - 47.0 %   MCV 95.4 80.0 - 100.0 fL   MCH 32.0 26.0 - 34.0 pg   MCHC 33.5 32.0 - 36.0 g/dL   RDW 14.0 11.5 - 14.5 %   Platelets 367 150 - 440 K/uL    Comment: Performed at [facility], [address] [place], [place] [address]  Renal function panel     Status: Abnormal   Collection Time: 10/01/17  7:11 AM  Result Value Ref Range   Sodium 141 135 - 145 mmol/L   Potassium 4.0 3.5 - 5.1 mmol/L   Chloride 99 (L) 101 - 111 mmol/L   CO2 23 22 - 32 mmol/L   Glucose, Bld 117 (H) 65 - 99 mg/dL   BUN 51 (H) 6 - 20 mg/dL   Creatinine, Ser 7.39 (H) 0.44 - 1.00 mg/dL   Calcium 10.3 8.9 - 10.3 mg/dL   Phosphorus 4.0 2.5 - 4.6 mg/dL   Albumin 4.2 3.5 - 5.0 g/dL   GFR calc non Af Amer 5 (L) >60 mL/min   GFR calc Af Amer 6 (L) >60 mL/min    Comment: (NOTE) The eGFR has been calculated using the CKD EPI equation. This calculation has not been validated in all clinical situations. eGFR's persistently <60 mL/min signify possible Chronic Kidney Disease.    Anion gap 19 (H) 5 - 15    Comment: Performed at [facility], [address] [place], [place] [address]  Ammonia     Status: None   Collection  Time: 10/01/17  7:11 AM  Result Value Ref Range   Ammonia 16 9 - 35 umol/L    Comment: Performed at [facility], [address] [place], [place] [address]  Glucose, capillary     Status: Abnormal   Collection Time: 10/02/17  6:10 AM  Result Value Ref Range   Glucose-Capillary 104 (H) 65 - 99 mg/dL   No results found.  Review of Systems  Unable to perform ROS: Mental status change  Psychiatric/Behavioral: Negative for depression and memory loss.    Blood pressure 136/80, pulse (!) 126, temperature 98.9 F (37.2 C), temperature source Oral, resp. rate 18, height '5\' 3"'$  (1.6 m), weight 72.6 kg (160 lb), SpO2 98 %. Physical Exam  Nursing note and vitals reviewed. Constitutional: She appears well-developed and well-nourished.  HENT:  Head: Normocephalic and atraumatic.  Eyes: Conjunctivae are normal. Pupils are equal, round, and reactive to light.  Neck: Normal range of motion.  Cardiovascular: Regular rhythm and normal heart sounds.  Respiratory: Effort normal. No respiratory distress.  GI: Soft.  Musculoskeletal: Normal range of motion.  Neurological: She is alert.  Skin: Skin is warm and dry.  Psychiatric: Her affect is blunt. Her speech is delayed. She is slowed. Cognition and memory are impaired. She expresses inappropriate judgment. She expresses no homicidal and no suicidal ideation.     Assessment/Plan Patient receives ECT today bilateral continuing Monday Wednesday Friday treatment  Alethia Berthold, MD 10/02/2017, 10:16 AM

## 2017-10-03 NOTE — Progress Notes (Signed)
Pt. This evening upon arrival observed in her room sleeping with 1:1 present for safety. Pt. During assessment is unable to follow commands and is very lethargic, responding to pain easily and voices with some persistence, oriented to person only, poor attention/concentration, with delayed responses. Vital signs during assessment are stable. Call placed to night shift house supervisor to review patient presentation with this writer and was notified to reach out to on-call/attending MD to update on pt. Condition. Attending MD notified of pt. Current condition with VS and blood sugar results. Orders received to continue to monitor vitals throughout the night Q4 and treatment team will re-evaluate in the morning. Pt. Monitored for safety throughout the night with 1:1. Pt. Unable to be compliant with ordered: incentive spirometer, orthostatic vitals, and/or medications. Pt. incontinent of stool/urine this evening, Peri care provided. Unable to perform complete psychiatric assessment this evening due to patient presentation. Precautionary measures taken by this writer and 1:1 to prevent skin breakdown. Pt. Applied with protective skin care products to prevent skin breakdown. Pt. Not compliant with staff encouragement to move in bed periodically to avoid skin breakdown. Staff will continue to monitor for safety.   @0500  vital signs, Pt. More responsive to verbal speech and able to follow commands with some improvement.    Precautionary checks every 15 minutes for safety maintained, room free of safety hazards, patient sustains no injury or falls during this shift.   1:1 maintained for MD orders  2000: Pt. Observed sleeping, 1:1 present for safety 2100: Pt. Observed sleeping, 1:1 present for safety 2200: Pt. Observed sleeping, 1:1 present for safety 2300: Pt. Observed sleeping, 1:1 present for safety 0000: Pt. Observed sleeping, 1:1 present for safety 0100: Pt. Observed sleeping, 1:1 present for  safety 0200:Pt. Observed sleeping, 1:1 present for safety 0300:Pt. Observed sleeping, 1:1 present for safety 0400: Pt. Observed sleeping, 1:1 present for safety 0500: Pt. Observed sleeping, 1:1 present for safety 0600: Pt. Observed sleeping, 1:1 present for safety

## 2017-10-03 NOTE — Progress Notes (Signed)
1:1 Maintained Per MD orders for safety of the patient  2000: Pt. Observed in bed awake resting with 1:1 present.  2100: Pt. Observed sleeping   2200: Pt. With 1:1 eating snack and drinking fluids in bed 2300: Pt. Observed trying to sleep at this time 0000: Pt. Observed sleeping  0100: Pt. Observed sleeping  0200: Pt. Observed sleeping 0300: Pt. Observed sleeping 0400: Pt. Observed sleeping 0500: Pt. Observed sleeping 0600: Pt. Observed sleeping

## 2017-10-03 NOTE — [facility] Group Notes (Signed)
LCSW Group Therapy Note  10/03/2017 1:15pm  Type of Therapy and Topic:  Group Therapy:  Fears and Unhealthy Coping Skills  Participation Level:  Did Not Attend   Description of Group:  The focus of this group was to discuss some of the prevalent fears that patients experience, and to identify the commonalities among group members.  An exercise was used to initiate the discussion, followed by writing on the white board a group-generated list of unhealthy coping and healthy coping techniques to deal with each fear.    Therapeutic Goals: 1. Patient will identify and describe 3 fears they experience 2. Patient will identify one positive coping strategy for each fear they experience 3. Patient will respond empathically to peers statements regarding fears they experience  Summary of Patient Progress:       Therapeutic Modalities Cognitive Behavioral Therapy Motivational Interviewing  [place], LCSW 10/03/2017 10:04 AM

## 2017-10-03 NOTE — Progress Notes (Signed)
1:1 Maintained Per MD orders  2000: Pt. Observed sleeping, 1:1 present for safety 2100: Pt. Observed sleeping, 1:1 present for safety 2200: Pt. Observed sleeping, 1:1 present for safety 2300: Pt. Observed sleeping, 1:1 present for safety 0000: Pt. Observed sleeping, 1:1 present for safety 0100: Pt. Observed sleeping, 1:1 present for safety 0200:Pt. Observed sleeping, 1:1 present for safety 0300:Pt. Observed sleeping, 1:1 present for safety 0400: Pt. Observed sleeping, 1:1 present for safety 0500: Pt. Observed sleeping, 1:1 present for safety 0600: Pt. Observed sleeping, 1:1 present for safety 0700: Pt. Observed sleeping, 1:1 present for safety

## 2017-10-03 NOTE — Plan of Care (Signed)
Pt. Has large stool this evening, peri care and cleaning performed. Pt. Saturating diapers. Pt. Does not appear in pain when utilizing FACES pain scaling. Pt. Will continue to be monitored for safety. Pt. Not able to understand any education provided. Pt. Not able to be compliant with medications. Pt. Eating very poorly. Pt. Not able to function adequately without staff interventions.    Not Progressing Education: Knowledge of the prescribed therapeutic regimen will improve 10/03/2017 0109 - Not Progressing by Reyes Ivan, RN Health Behavior/Discharge Planning: Compliance with prescribed medication regimen will improve 10/03/2017 0109 - Not Progressing by Reyes Ivan, RN Nutritional: Ability to achieve adequate nutritional intake will improve 10/03/2017 0109 - Not Progressing by Reyes Ivan, RN Spiritual Needs Ability to function at adequate level 10/03/2017 0109 - Not Progressing by Reyes Ivan, RN  Progressing Elimination: Will not experience complications related to bowel motility 10/03/2017 0109 - Progressing by Reyes Ivan, RN Will not experience complications related to urinary retention 10/03/2017 0109 - Progressing by Reyes Ivan, RN Pain Managment: General experience of comfort will improve 10/03/2017 0109 - Progressing by Reyes Ivan, RN Safety: Ability to remain free from injury will improve 10/03/2017 0109 - Progressing by Reyes Ivan, RN

## 2017-10-03 NOTE — Progress Notes (Signed)
Pt is currently sitting up in bed eating graham crackers with peanut butter. Pt is consuming ice tea with snack. No s/sx of dysphagia noted. Will cont to monitor pt.

## 2017-10-03 NOTE — Progress Notes (Signed)
[facility] MD Progress Note  10/03/2017 1:24 PM Joyce [name]  MRN:  [id number] Subjective: Patient with bipolar disorder currently in a severe episode with depression with near catatonic features.  Patient had ECT treatment yesterday morning tolerated well.   Still disorganized and difficult to understand. Pt reportedly talking more and more alert, but eating and drinking poor. Had high BP but latest is 128/78.  Principal Problem: Bipolar affective disorder, current episode manic with psychotic symptoms ([place]) Diagnosis:   Patient Active Problem List   Diagnosis Date Noted  . Palliative care encounter [Z51.5]   . Bipolar affective disorder, current episode depressed with psychotic symptoms ([place]) [F31.2] 09/17/2017  . Bipolar I disorder, most recent episode (or current) manic ([place]) [F31.10] 09/02/2017  . Acute delirium [R41.0] 09/02/2017  . Altered mental status [R41.82]   . Acute encephalopathy [G93.40] 09/01/2017  . ESRD on dialysis ([place]) [N18.6, Z99.2] 04/06/2017  . Complication of vascular access for dialysis [T82.9XXA] 04/06/2017  . Hematuria [R31.9] 12/18/2016  . Polycystic kidney [Q61.3] 10/12/2016  . Ruptured cyst of kidney [Q61.00] 10/03/2016  . HTN (hypertension) [I10] 10/01/2016  . Chronic kidney disease [N18.9] 10/01/2016  . Anemia associated with chronic renal failure [D63.1] 09/24/2016  . Acute kidney insufficiency [N28.9] 07/28/2016  . Noninfectious diarrhea [K52.9]   . Benign neoplasm of cecum [D12.0]   . Benign neoplasm of ascending colon [D12.2]   . Diarrhea [R19.7]   . Nausea [R11.0]   . Gastric polyp [K31.7]   . Chronic constipation [K59.09] 12/27/2014  . Nausea with vomiting [R11.2] 12/27/2014  . Left sided abdominal pain [R10.9] 12/27/2014  . Personal history of colonic polyps [Z86.010] 05/10/2013   Total Time spent with patient: 30 minutes  Past Psychiatric History: Long history of bipolar disorder with recent decompensations especially since beginning dialysis  treatment  Past Medical History:  Past Medical History:  Diagnosis Date  . Anal fissure   . Bipolar affective disorder ([place])   . CKD (chronic kidney disease)    Dr Holley Raring [name] 4  . Colon polyps   . Diverticulitis   . Diverticulitis   . Family history of adverse reaction to anesthesia    mom - PONV  . GERD (gastroesophageal reflux disease)   . Headache    migraines - none over 10 yrs  . Heart murmur   . History of hiatal hernia   . Hypertension   . Pancreatitis    Valproic acid  . Vertigo     Past Surgical History:  Procedure Laterality Date  . A/V FISTULAGRAM Left 04/14/2017   Procedure: A/V Fistulagram;  Surgeon: Katha Cabal, MD;  Location: [place] [facility];  Service: Cardiovascular;  Laterality: Left;  . A/V FISTULAGRAM Left 06/09/2017   Procedure: A/V FISTULAGRAM;  Surgeon: Katha Cabal, MD;  Location: [place] [facility];  Service: Cardiovascular;  Laterality: Left;  . ABDOMINAL HYSTERECTOMY  1990 ?  . AV FISTULA PLACEMENT  4/30  . BREAST EXCISIONAL BIOPSY Left 1994   neg surgical bx  . CHOLECYSTECTOMY  2003  . COLONOSCOPY  2014   Dr. Jamal Collin  . COLONOSCOPY WITH PROPOFOL N/A 09/24/2015   Procedure: COLONOSCOPY WITH random colon byopies.;  Surgeon: Lucilla Lame, MD;  Location: [place];  Service: Endoscopy;  Laterality: N/A;  . ESOPHAGOGASTRODUODENOSCOPY (EGD) WITH PROPOFOL N/A 09/24/2015   Procedure: ESOPHAGOGASTRODUODENOSCOPY (EGD) ;  Surgeon: Lucilla Lame, MD;  Location: [place];  Service: Endoscopy;  Laterality: N/A;  . EYE SURGERY    . PERIPHERAL VASCULAR  CATHETERIZATION N/A 05/29/2015   Procedure: A/V Shuntogram/Fistulagram;  Surgeon: Katha Cabal, MD;  Location: [place] [facility];  Service: Cardiovascular;  Laterality: N/A;  . PERIPHERAL VASCULAR CATHETERIZATION N/A 05/29/2015   Procedure: A/V Shunt Intervention;  Surgeon: Katha Cabal, MD;  Location: [place] [facility];  Service: Cardiovascular;  Laterality: N/A;   . POLYPECTOMY  09/24/2015   Procedure: POLYPECTOMY INTESTINAL;  Surgeon: Lucilla Lame, MD;  Location: [place];  Service: Endoscopy;;  cecal polyp ascending polyp   Family History:  Family History  Problem Relation Age of Onset  . Stroke Father   . Hypertension Father   . Breast cancer Other   . Colon cancer Neg Hx   . Liver disease Neg Hx    Family Psychiatric  History: None Social History:  Social History   Substance and Sexual Activity  Alcohol Use No  . Alcohol/week: 0.0 oz     Social History   Substance and Sexual Activity  Drug Use No    Social History   Socioeconomic History  . Marital status: Single    Spouse name: None  . Number of children: 1  . Years of education: None  . Highest education level: None  Social Needs  . Financial resource strain: None  . Food insecurity - worry: None  . Food insecurity - inability: None  . Transportation needs - medical: None  . Transportation needs - non-medical: None  Occupational History  . Occupation: Surveyor, quantity: [employer]  Tobacco Use  . Smoking status: Never Smoker  . Smokeless tobacco: Never Used  Substance and Sexual Activity  . Alcohol use: No    Alcohol/week: 0.0 oz  . Drug use: No  . Sexual activity: Not Currently  Other Topics Concern  . 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   Additional Social History:    History of alcohol / drug use?: No history of alcohol / drug abuse                    Sleep: Fair  Appetite:  Poor  Current Medications: Current Facility-Administered Medications  Medication Dose Route Frequency Provider Last Rate Last Dose  . acetaminophen (TYLENOL) tablet 325 mg  325 mg Oral BID PRN Clapacs, [name], MD   325 mg at 09/27/17 1225  . alum & mag hydroxide-simeth (MAALOX/MYLANTA) 200-200-20 MG/5ML suspension 30 mL  30 mL Oral Q4H PRN Clapacs, [name], MD      .  bisacodyl (DULCOLAX) EC tablet 5 mg  5 mg Oral Daily PRN Clapacs, [name], MD      . brimonidine (ALPHAGAN) 0.2 % ophthalmic solution 1 drop  1 drop Both Eyes BID Clapacs, [name], MD   1 drop at 09/29/17 0850  . [START ON 10/17/2017] cyanocobalamin ((VITAMIN B-12)) injection 1,000 mcg  1,000 mcg Intramuscular Q30 days Clapacs, [name], MD      . dicyclomine (BENTYL) tablet 20 mg  20 mg Oral TID AC Clapacs, [name], MD   20 mg at 10/03/17 0912  . haloperidol (HALDOL) tablet 5 mg  5 mg Oral Q8H PRN [name], [name], MD       Or  . haloperidol lactate (HALDOL) injection 5 mg  5 mg Intramuscular Q8H PRN [name], [name], MD   5 mg at 10/01/17 0908  . lidocaine-prilocaine (EMLA) cream 1 application  1 application  Topical Q M,W,F Clapacs, [name], MD      . magnesium hydroxide (MILK OF MAGNESIA) suspension 30 mL  30 mL Oral Daily PRN Clapacs, [name], MD      . metoprolol tartrate (LOPRESSOR) tablet 25 mg  25 mg Oral BID Gladstone Lighter, MD   25 mg at 10/03/17 0911  . midodrine (PROAMATINE) tablet 10 mg  10 mg Oral TID WC [name], MD   10 mg at 10/03/17 0913  . multivitamin (RENA-VIT) tablet 1 tablet  1 tablet Oral QHS [name], [name], MD   1 tablet at 09/30/17 2038  . OLANZapine zydis (ZYPREXA) disintegrating tablet 5 mg  5 mg Oral TID [name], [name], MD   5 mg at 10/03/17 0910  . pantoprazole (PROTONIX) EC tablet 40 mg  40 mg Oral Daily Clapacs, [name], MD   40 mg at 10/03/17 0910  . timolol (TIMOPTIC) 0.5 % ophthalmic solution 1 drop  1 drop Both Eyes BID Clapacs, [name], MD   1 drop at 09/29/17 0850  . vitamin C (ASCORBIC ACID) tablet 500 mg  500 mg Oral BID [name], [name], MD   500 mg at 10/03/17 0912    Lab Results:  Results for orders placed or performed during the hospital encounter of 09/17/17 (from the past 48 hour(s))  Glucose, capillary     Status: Abnormal   Collection Time: 10/02/17  6:10 AM  Result Value Ref Range   Glucose-Capillary 104 (H) 65 - 99  mg/dL  Renal function panel     Status: Abnormal   Collection Time: 10/02/17  3:59 PM  Result Value Ref Range   Sodium 143 135 - 145 mmol/L   Potassium 4.1 3.5 - 5.1 mmol/L   Chloride 101 101 - 111 mmol/L   CO2 22 22 - 32 mmol/L   Glucose, Bld 113 (H) 65 - 99 mg/dL   BUN 76 (H) 6 - 20 mg/dL   Creatinine, Ser 9.74 (H) 0.44 - 1.00 mg/dL   Calcium 9.7 8.9 - 10.3 mg/dL   Phosphorus 4.7 (H) 2.5 - 4.6 mg/dL   Albumin 4.0 3.5 - 5.0 g/dL   GFR calc non Af Amer 4 (L) >60 mL/min   GFR calc Af Amer 4 (L) >60 mL/min    Comment: (NOTE) The eGFR has been calculated using the CKD EPI equation. This calculation has not been validated in all clinical situations. eGFR's persistently <60 mL/min signify possible Chronic Kidney Disease.    Anion gap 20 (H) 5 - 15    Comment: Performed at [facility], [address] [place], [place] [address]  CBC     Status: Abnormal   Collection Time: 10/02/17  3:59 PM  Result Value Ref Range   WBC 11.5 (H) 3.6 - 11.0 K/uL   RBC 3.83 3.80 - 5.20 MIL/uL   Hemoglobin 12.1 12.0 - 16.0 g/dL   HCT 37.1 35.0 - 47.0 %   MCV 96.9 80.0 - 100.0 fL   MCH 31.5 26.0 - 34.0 pg   MCHC 32.5 32.0 - 36.0 g/dL   RDW 14.4 11.5 - 14.5 %   Platelets 385 150 - 440 K/uL    Comment: Performed at [facility], [address] [place],  [address]  Glucose, capillary     Status: Abnormal   Collection Time: 10/02/17  9:22 PM  Result Value Ref Range   Glucose-Capillary 103 (H) 65 - 99 mg/dL    Blood Alcohol level:  Lab Results  Component Value Date   ETH <10 85/08/7739    Metabolic Disorder Labs: Lab Results  Component Value Date   HGBA1C 4.8 09/18/2017   MPG 91.06 09/18/2017   No results found for: PROLACTIN Lab Results  Component Value Date   CHOL 251 (H) 09/18/2017   TRIG 124 09/18/2017   HDL 66 09/18/2017   CHOLHDL 3.8 09/18/2017   VLDL 25 09/18/2017   LDLCALC 160 (H) 09/18/2017   LDLCALC 43 05/14/2014    Physical Findings: AIMS:  Facial and Oral Movements Muscles of Facial Expression: None, normal Lips and Perioral Area: None, normal Jaw: None, normal Tongue: None, normal,Extremity Movements Upper (arms, wrists, hands, fingers): None, normal Lower (legs, knees, ankles, toes): None, normal, Trunk Movements Neck, shoulders, hips: None, normal, Overall Severity Severity of abnormal movements (highest score from questions above): None, normal Incapacitation due to abnormal movements: None, normal Patient's awareness of abnormal movements (rate only patient's report): No Awareness, Dental Status Current problems with teeth and/or dentures?: No Does patient usually wear dentures?: No  CIWA:    COWS:     Musculoskeletal: Strength & Muscle Tone: decreased Gait & Station: unable to stand Patient leans: N/A  Psychiatric Specialty Exam: Physical Exam  Nursing note and vitals reviewed. Constitutional: She appears well-developed and well-nourished.  Eyes: Conjunctivae are normal.  Cardiovascular: Regular rhythm.  Respiratory: No respiratory distress.  Neurological: She is alert.  Psychiatric: Her affect is blunt. Her speech is delayed. She is slowed and withdrawn. Cognition and memory are impaired. She expresses inappropriate judgment.    Review of Systems  Unable to perform ROS: Mental status change    Blood pressure 128/78, pulse (!) 115, temperature 98.2 F (36.8 C), temperature source Oral, resp. rate 16, height '5\' 3"'$  (1.6 m), weight 72.6 kg (160 lb), SpO2 96 %.Body mass index is 28.34 kg/m.  General Appearance: Disheveled  Eye Contact:  Minimal  Speech:  Garbled, Slow and Slurred  Volume:  Decreased  Mood:  Dysphoric  Affect:  Constricted  Thought Process:  Disorganized  Orientation:  Negative  Thought Content:  Illogical  Suicidal Thoughts:  No  Homicidal Thoughts:  No  Memory:  Immediate;   Poor Recent;   Poor Remote;   Poor  Judgement:  Poor  Insight:  Lacking  Psychomotor Activity:  Decreased   Concentration:  Concentration: Poor  Recall:  Poor  Fund of Knowledge:  Poor  Language:  Poor  Akathisia:  No  Handed:  Right  AIMS (if indicated):     Assets:  Social Support  ADL's:  Impaired  Cognition:  Impaired,  Severe, ox2  Sleep:  Number of Hours: 8.15     Treatment Plan Summary: Pt confused, psychotic, poor po intake, cont current tx, next ECT on Monday. Will monitor po intake. Dialysis on Monday. Cont 1:1.  Daily contact with patient to assess and evaluate symptoms and progress in treatment, Medication management and Plan Patient beginning ECT treatment for delirium, or catatonia as part of severe depression and bipolar disorder.  Patient had treatment today and tolerated it without difficulty.  We are holding off on Tegretol for now.  Continue other medication.  Continue encouraging patient to eat and drink.  Please continue dialysis and medical follow-up.  Lenward Chancellor, MD 10/03/2017, 1:24 PMPatient ID: Joyce [name], female   DOB: 28-Mar-1946, 72 y.o.   MRN: [id number]

## 2017-10-03 NOTE — Progress Notes (Signed)
Received Morrissa this today at approximately  0945 hrs. The MHT was at the bedside. Lylliana was placed in a chair and her bed linens changed. She remained in the Geriatric chair and relocated to the dayroom throughout the morning. She was compliant with her early am medications, but noncompliant with her noon medications although it was crushed in a spoonful of apple sauce. She asked to use the bathroom and had a medium size loose stool this AM and PM along with urine. She was set up to eat lunch, fed herself mostly with this Probation officer assistance. She was verbal this am, responded to her name and stated she is in the hospital. This afternoon her sister visited, she ate her pudding from her dinner tray and was compliant with her medications. She returned to bed at 1750 hrs. No acute distress noted although she is restless at intervals in the bed.  1000 hrs - in the chair in the day room 1100 hrs - in the chair in the day room 1200 hrs - in the chair in the day room 1300 hrs - in the chair in the day room 1400 hrs - in bed, sleeping at intervals 1500 hrs - in  bed,sleeping at intervals 1600 hrs - I n the bed awake  1630 hrs - OOB in the chair to visit with her sister 1700 hrs in the chair visiting with her sister   1800 hrs - in the bed awake   1900 hrs- in the bed awake  1930 hrs - in the bed awake

## 2017-10-04 NOTE — Progress Notes (Signed)
Pt. Upon arrival to the unit observed in her bed awake with nursing. Pt. During assessment able to demonstrate orientation to person, place, and situation, but not time. Pt. Continues to have very delayed responses with catatonic features with an apathetic and blank expression. Pt. Has poor attention and concentration as well as eye contract is poor. Pt. Able to do pushes/pulls and grips this evening. Pt. Able to verbalize and deny pain this evening. Pt. Able to verbalize she is doing, "okay" and able to follow commands.  Pt. Able to be Compliant with medications this evening with applesauce. Pt. Able to remain safe while on the unit, 1:1 maintained for safety. Pt. Eating and drinking very poorly, but a big improvement for the following evening. Pt. Was able to eat graham crackers and apple sauce. Pt. Was able to drink fluids. Pt. Continues to need lots of help with ADLs, mobility, and nutrition. Pt. Unable to utilize incentive spirometer. Pt. Not able to complete orthostatic vitals.     Precautionary checks every 15 minutes for safety maintained, room free of safety hazards, patient sustains no injury or falls during this shift.

## 2017-10-04 NOTE — Plan of Care (Signed)
Patient is alert to self. Patient does not speak unless needing to go to restroom or wanting something to drink. Patient is sleeping in her room most of the time. Patient is compliant with medications if crushed and put into apple sauce. Patient would not answer any questions for RN today. Nurse will continue to monitor and 1:1 MHT/ Safety sitter within arms reach. Elimination: Will not experience complications related to bowel motility 12/31/2447 7530 - Not Applicable by Geraldo Docker, RN 10/04/2017 1358 - Not Progressing by Geraldo Docker, RN Will not experience complications related to urinary retention 0/51/1021 1173 - Not Applicable by Geraldo Docker, RN 10/04/2017 1358 - Not Progressing by Geraldo Docker, RN   Pain Managment: General experience of comfort will improve 10/04/2017 1359 - Progressing by Geraldo Docker, RN 10/04/2017 1358 - Not Progressing by Geraldo Docker, RN   Education: Will be free of psychotic symptoms 10/04/2017 1359 - Not Progressing by Geraldo Docker, RN 10/04/2017 1358 - Not Progressing by Geraldo Docker, RN Knowledge of the prescribed therapeutic regimen will improve 10/04/2017 1359 - Not Progressing by Geraldo Docker, RN 10/04/2017 1358 - Not Progressing by Geraldo Docker, RN

## 2017-10-04 NOTE — Progress Notes (Signed)
2200: Patient in bed awake, calm and cooperative. Received bedtime medication along with apple sauce. Drank 240 cc of orange juice. Has no concern. Staff continue to monitor on 1:1.

## 2017-10-04 NOTE — Plan of Care (Signed)
Pt. Able to verbalize and deny pain this evening. Pt. Able to verbalize she is doing, "okay". Pt. Interaction still very minimal. Pt. Compliant with medications this evening. Pt. Able to remain safe while on the unit, 1:1 maintained for safety. Pt. Continues to present with catatonic features. Pt. Eating and drinking very poorly, but a big improvement for the following evening. Pt. Continues to need lots of help with ADLs, mobility, and nutrition.    Not Progressing Education: Will be free of psychotic symptoms 10/04/2017 0145 - Not Progressing by Reyes Ivan, RN Knowledge of the prescribed therapeutic regimen will improve 10/04/2017 0145 - Not Progressing by Reyes Ivan, RN Nutritional: Ability to achieve adequate nutritional intake will improve 10/04/2017 0145 - Not Progressing by Reyes Ivan, RN Spiritual Needs Ability to function at adequate level 10/04/2017 0145 - Not Progressing by Reyes Ivan, RN   Progressing Pain Managment: General experience of comfort will improve 10/04/2017 0145 - Progressing by Reyes Ivan, RN Coping: Ability to verbalize feelings will improve 10/04/2017 0145 - Progressing by Reyes Ivan, RN Health Behavior/Discharge Planning: Compliance with prescribed medication regimen will improve 10/04/2017 0145 - Progressing by Reyes Ivan, RN Safety: Ability to remain free from injury will improve 10/04/2017 0145 - Progressing by Reyes Ivan, RN

## 2017-10-04 NOTE — Progress Notes (Signed)
Patient is laying in the bed awake; even breaths; no distress noted. CNA/Safety-sitter within arms reach.

## 2017-10-04 NOTE — Progress Notes (Signed)
4 PM: Patient in craft room with son and daughter in law visiting. No distress noted; MHT present; Nurse will continue to monitor.

## 2017-10-04 NOTE — Progress Notes (Signed)
[facility] MD Progress Note  10/04/2017 1:08 PM Joyce [name]  MRN:  [id number] Subjective: Patient with bipolar disorder currently in a severe episode with depression with near catatonic features.     Still disorganized and confused ,but more alert, ox2,  has decreased PMA, less catatonic, eating and drinking slightly better.  Patient on ECT, next planned tomorrow.  Principal Problem: Bipolar affective disorder, current episode manic with psychotic symptoms ([place]) Diagnosis:   Patient Active Problem List   Diagnosis Date Noted  . Palliative care encounter [Z51.5]   . Bipolar affective disorder, current episode depressed with psychotic symptoms ([place]) [F31.2] 09/17/2017  . Bipolar I disorder, most recent episode (or current) manic ([place]) [F31.10] 09/02/2017  . Acute delirium [R41.0] 09/02/2017  . Altered mental status [R41.82]   . Acute encephalopathy [G93.40] 09/01/2017  . ESRD on dialysis ([place]) [N18.6, Z99.2] 04/06/2017  . Complication of vascular access for dialysis [T82.9XXA] 04/06/2017  . Hematuria [R31.9] 12/18/2016  . Polycystic kidney [Q61.3] 10/12/2016  . Ruptured cyst of kidney [Q61.00] 10/03/2016  . HTN (hypertension) [I10] 10/01/2016  . Chronic kidney disease [N18.9] 10/01/2016  . Anemia associated with chronic renal failure [D63.1] 09/24/2016  . Acute kidney insufficiency [N28.9] 07/28/2016  . Noninfectious diarrhea [K52.9]   . Benign neoplasm of cecum [D12.0]   . Benign neoplasm of ascending colon [D12.2]   . Diarrhea [R19.7]   . Nausea [R11.0]   . Gastric polyp [K31.7]   . Chronic constipation [K59.09] 12/27/2014  . Nausea with vomiting [R11.2] 12/27/2014  . Left sided abdominal pain [R10.9] 12/27/2014  . Personal history of colonic polyps [Z86.010] 05/10/2013   Total Time spent with patient: 30 minutes  Past Psychiatric History: Long history of bipolar disorder with recent decompensations especially since beginning dialysis treatment  Past Medical History:  Past  Medical History:  Diagnosis Date  . Anal fissure   . Bipolar affective disorder ([place])   . CKD (chronic kidney disease)    Dr Holley Raring [name] 4  . Colon polyps   . Diverticulitis   . Diverticulitis   . Family history of adverse reaction to anesthesia    mom - PONV  . GERD (gastroesophageal reflux disease)   . Headache    migraines - none over 10 yrs  . Heart murmur   . History of hiatal hernia   . Hypertension   . Pancreatitis    Valproic acid  . Vertigo     Past Surgical History:  Procedure Laterality Date  . A/V FISTULAGRAM Left 04/14/2017   Procedure: A/V Fistulagram;  Surgeon: Katha Cabal, MD;  Location: [place] [facility];  Service: Cardiovascular;  Laterality: Left;  . A/V FISTULAGRAM Left 06/09/2017   Procedure: A/V FISTULAGRAM;  Surgeon: Katha Cabal, MD;  Location: [place] [facility];  Service: Cardiovascular;  Laterality: Left;  . ABDOMINAL HYSTERECTOMY  1990 ?  . AV FISTULA PLACEMENT  4/30  . BREAST EXCISIONAL BIOPSY Left 1994   neg surgical bx  . CHOLECYSTECTOMY  2003  . COLONOSCOPY  2014   Dr. Jamal Collin  . COLONOSCOPY WITH PROPOFOL N/A 09/24/2015   Procedure: COLONOSCOPY WITH random colon byopies.;  Surgeon: Lucilla Lame, MD;  Location: [place];  Service: Endoscopy;  Laterality: N/A;  . ESOPHAGOGASTRODUODENOSCOPY (EGD) WITH PROPOFOL N/A 09/24/2015   Procedure: ESOPHAGOGASTRODUODENOSCOPY (EGD) ;  Surgeon: Lucilla Lame, MD;  Location: [place];  Service: Endoscopy;  Laterality: N/A;  . EYE SURGERY    . PERIPHERAL VASCULAR CATHETERIZATION N/A 05/29/2015   Procedure:  A/V Shuntogram/Fistulagram;  Surgeon: Katha Cabal, MD;  Location: [place] [facility];  Service: Cardiovascular;  Laterality: N/A;  . PERIPHERAL VASCULAR CATHETERIZATION N/A 05/29/2015   Procedure: A/V Shunt Intervention;  Surgeon: Katha Cabal, MD;  Location: [place] [facility];  Service: Cardiovascular;  Laterality: N/A;  . POLYPECTOMY  09/24/2015   Procedure:  POLYPECTOMY INTESTINAL;  Surgeon: Lucilla Lame, MD;  Location: [place];  Service: Endoscopy;;  cecal polyp ascending polyp   Family History:  Family History  Problem Relation Age of Onset  . Stroke Father   . Hypertension Father   . Breast cancer Other   . Colon cancer Neg Hx   . Liver disease Neg Hx    Family Psychiatric  History: None Social History:  Social History   Substance and Sexual Activity  Alcohol Use No  . Alcohol/week: 0.0 oz     Social History   Substance and Sexual Activity  Drug Use No    Social History   Socioeconomic History  . Marital status: Single    Spouse name: None  . Number of children: 1  . Years of education: None  . Highest education level: None  Social Needs  . Financial resource strain: None  . Food insecurity - worry: None  . Food insecurity - inability: None  . Transportation needs - medical: None  . Transportation needs - non-medical: None  Occupational History  . Occupation: Surveyor, quantity: [employer]  Tobacco Use  . Smoking status: Never Smoker  . Smokeless tobacco: Never Used  Substance and Sexual Activity  . Alcohol use: No    Alcohol/week: 0.0 oz  . Drug use: No  . Sexual activity: Not Currently  Other Topics Concern  . 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   Additional Social History:    History of alcohol / drug use?: No history of alcohol / drug abuse                    Sleep: Fair  Appetite:  Poor  Current Medications: Current Facility-Administered Medications  Medication Dose Route Frequency Provider Last Rate Last Dose  . acetaminophen (TYLENOL) tablet 325 mg  325 mg Oral BID PRN Clapacs, [name], MD   325 mg at 09/27/17 1225  . alum & mag hydroxide-simeth (MAALOX/MYLANTA) 200-200-20 MG/5ML suspension 30 mL  30 mL Oral Q4H PRN Clapacs, [name], MD      . bisacodyl (DULCOLAX) EC tablet 5 mg  5 mg Oral  Daily PRN Clapacs, [name], MD      . brimonidine (ALPHAGAN) 0.2 % ophthalmic solution 1 drop  1 drop Both Eyes BID Clapacs, [name], MD   1 drop at 10/04/17 0810  . [START ON 10/17/2017] cyanocobalamin ((VITAMIN B-12)) injection 1,000 mcg  1,000 mcg Intramuscular Q30 days Clapacs, [name], MD      . dicyclomine (BENTYL) tablet 20 mg  20 mg Oral TID AC Clapacs, [name], MD   20 mg at 10/04/17 0801  . haloperidol (HALDOL) tablet 5 mg  5 mg Oral Q8H PRN [name], [name], MD       Or  . haloperidol lactate (HALDOL) injection 5 mg  5 mg Intramuscular Q8H PRN [name], [name], MD   5 mg at 10/01/17 0908  . lidocaine-prilocaine (EMLA) cream 1 application  1 application Topical Q M,W,F Clapacs, [name],  MD      . magnesium hydroxide (MILK OF MAGNESIA) suspension 30 mL  30 mL Oral Daily PRN Clapacs, [name], MD      . metoprolol tartrate (LOPRESSOR) tablet 25 mg  25 mg Oral BID Gladstone Lighter, MD   25 mg at 10/04/17 0759  . midodrine (PROAMATINE) tablet 10 mg  10 mg Oral TID WC [name], MD   10 mg at 10/04/17 0800  . multivitamin (RENA-VIT) tablet 1 tablet  1 tablet Oral QHS [name], [name], MD   1 tablet at 10/03/17 2155  . OLANZapine zydis (ZYPREXA) disintegrating tablet 5 mg  5 mg Oral TID [name], [name], MD   5 mg at 10/04/17 0810  . pantoprazole (PROTONIX) EC tablet 40 mg  40 mg Oral Daily Clapacs, [name], MD   40 mg at 10/04/17 0759  . timolol (TIMOPTIC) 0.5 % ophthalmic solution 1 drop  1 drop Both Eyes BID Clapacs, [name], MD   1 drop at 10/04/17 0810  . vitamin C (ASCORBIC ACID) tablet 500 mg  500 mg Oral BID [name], [name], MD   500 mg at 10/04/17 0801    Lab Results:  Results for orders placed or performed during the hospital encounter of 09/17/17 (from the past 48 hour(s))  Renal function panel     Status: Abnormal   Collection Time: 10/02/17  3:59 PM  Result Value Ref Range   Sodium 143 135 - 145 mmol/L   Potassium 4.1 3.5 - 5.1 mmol/L   Chloride 101  101 - 111 mmol/L   CO2 22 22 - 32 mmol/L   Glucose, Bld 113 (H) 65 - 99 mg/dL   BUN 76 (H) 6 - 20 mg/dL   Creatinine, Ser 9.74 (H) 0.44 - 1.00 mg/dL   Calcium 9.7 8.9 - 10.3 mg/dL   Phosphorus 4.7 (H) 2.5 - 4.6 mg/dL   Albumin 4.0 3.5 - 5.0 g/dL   GFR calc non Af Amer 4 (L) >60 mL/min   GFR calc Af Amer 4 (L) >60 mL/min    Comment: (NOTE) The eGFR has been calculated using the CKD EPI equation. This calculation has not been validated in all clinical situations. eGFR's persistently <60 mL/min signify possible Chronic Kidney Disease.    Anion gap 20 (H) 5 - 15    Comment: Performed at [facility], [address] [place], [place] [address]  CBC     Status: Abnormal   Collection Time: 10/02/17  3:59 PM  Result Value Ref Range   WBC 11.5 (H) 3.6 - 11.0 K/uL   RBC 3.83 3.80 - 5.20 MIL/uL   Hemoglobin 12.1 12.0 - 16.0 g/dL   HCT 37.1 35.0 - 47.0 %   MCV 96.9 80.0 - 100.0 fL   MCH 31.5 26.0 - 34.0 pg   MCHC 32.5 32.0 - 36.0 g/dL   RDW 14.4 11.5 - 14.5 %   Platelets 385 150 - 440 K/uL    Comment: Performed at [facility], [address] [place], [place] [address]  Glucose, capillary     Status: Abnormal   Collection Time: 10/02/17  9:22 PM  Result Value Ref Range   Glucose-Capillary 103 (H) 65 - 99 mg/dL    Blood Alcohol level:  Lab Results  Component Value Date   ETH <10 17/61/6073    Metabolic Disorder Labs: Lab Results  Component Value Date   HGBA1C 4.8 09/18/2017   MPG 91.06 09/18/2017   No results found for:  PROLACTIN Lab Results  Component Value Date   CHOL 251 (H) 09/18/2017   TRIG 124 09/18/2017   HDL 66 09/18/2017   CHOLHDL 3.8 09/18/2017   VLDL 25 09/18/2017   LDLCALC 160 (H) 09/18/2017   LDLCALC 43 05/14/2014    Physical Findings: AIMS: Facial and Oral Movements Muscles of Facial Expression: None, normal Lips and Perioral Area: None, normal Jaw: None, normal Tongue: None, normal,Extremity Movements Upper (arms, wrists,  hands, fingers): None, normal Lower (legs, knees, ankles, toes): None, normal, Trunk Movements Neck, shoulders, hips: None, normal, Overall Severity Severity of abnormal movements (highest score from questions above): None, normal Incapacitation due to abnormal movements: None, normal Patient's awareness of abnormal movements (rate only patient's report): No Awareness, Dental Status Current problems with teeth and/or dentures?: No Does patient usually wear dentures?: No  CIWA:    COWS:     Musculoskeletal: Strength & Muscle Tone: decreased Gait & Station: unable to stand Patient leans: N/A  Psychiatric Specialty Exam: Physical Exam  Nursing note and vitals reviewed. Constitutional: She appears well-developed and well-nourished.  Eyes: Conjunctivae are normal.  Cardiovascular: Regular rhythm.  Respiratory: No respiratory distress.  Neurological: She is alert.  Psychiatric: Her affect is blunt. Her speech is delayed. She is slowed and withdrawn. Cognition and memory are impaired. She expresses inappropriate judgment.    Review of Systems  Unable to perform ROS: Mental status change    Blood pressure (!) 122/34, pulse (!) 52, temperature 98.6 F (37 C), temperature source Oral, resp. rate 17, height '5\' 3"'$  (1.6 m), weight 72.6 kg (160 lb), SpO2 97 %.Body mass index is 28.34 kg/m.  General Appearance: Disheveled  Eye Contact:  Minimal  Speech:  slow  Volume:  Decreased  Mood:  Dysphoric  Affect:  Constricted  Thought Process:  Disorganized  Orientation:  Negative  Thought Content:  Illogical, concrete  Suicidal Thoughts:  No  Homicidal Thoughts:  No  Memory:  Immediate;   Poor Recent;   Poor Remote;   Poor  Judgement:  Poor  Insight:  Lacking  Psychomotor Activity:  Decreased  Concentration:  Concentration: Poor  Recall:  Poor  Fund of Knowledge:  Poor  Language:  Poor  Akathisia:  No  Handed:  Right  AIMS (if indicated):     Assets:  Social Support  ADL's:   Impaired  Cognition:  Impaired,  Severe, ox2  Sleep:  Number of Hours: 6.5     Treatment Plan Summary: Pt  psychotic,  cont current tx, next ECT tomorrow. Will monitor po intake. Dialysis on Monday. Cont 1:1.  Daily contact with patient to assess and evaluate symptoms and progress in treatment, Medication management and Plan Patient beginning ECT treatment for delirium, or catatonia as part of severe depression and bipolar disorder.  Patient had treatment today and tolerated it without difficulty.  We are holding off on Tegretol for now.  Continue other medication.  Continue encouraging patient to eat and drink.  Please continue dialysis and medical follow-up.  Lenward Chancellor, MD 10/04/2017, 1:08 PMPatient ID: Joyce [name], female   DOB: Jun 29, 1946, 72 y.o.   MRN: [id number] Patient ID: Joyce [name], female   DOB: 08/16/1945, 72 y.o.   MRN: [id number]

## 2017-10-04 NOTE — Progress Notes (Signed)
2100: Patient remains in bed awake, calm and cooperative. No sign of distress at present. Staff continue to monitor on 1:1 observations.

## 2017-10-04 NOTE — Progress Notes (Signed)
Patient alert is alert in bed; with eyes open wide staring at the ceiling. Patient was able to take medications with apple sauce. No distress detected. Nurse will continue to monitor. CNA/ Safety sitter within arms reach.

## 2017-10-04 NOTE — Progress Notes (Signed)
Patient in bed awake. Calm. No sign of distress. Safety maintained on 1:1 observations.

## 2017-10-04 NOTE — [facility] Group Notes (Signed)
LCSW Group Therapy Note 10/04/2017 1:15pm  Type of Therapy and Topic: Group Therapy: Feelings Around Returning Home & Establishing a Supportive Framework and Supporting Oneself When Supports Not Available  Participation Level: Did Not Attend  Description of Group:  Patients first processed thoughts and feelings about upcoming discharge. These included fears of upcoming changes, lack of change, new living environments, judgements and expectations from others and overall stigma of mental health issues. The group then discussed the definition of a supportive framework, what that looks and feels like, and how do to discern it from an unhealthy non-supportive network. The group identified different types of supports as well as what to do when your family/friends are less than helpful or unavailable  Therapeutic Goals  1. Patient will identify one healthy supportive network that they can use at discharge. 2. Patient will identify one factor of a supportive framework and how to tell it from an unhealthy network. 3. Patient able to identify one coping skill to use when they do not have positive supports from others. 4. Patient will demonstrate ability to communicate their needs through discussion and/or role plays.  Summary of Patient Progress:     Therapeutic Modalities Cognitive Behavioral Therapy Motivational Interviewing   Rolland Steinert  CUEBAS-COLON, LCSW 10/04/2017 12:22 PM

## 2017-10-04 NOTE — Progress Notes (Signed)
1900: Patient in room resting, sitter at bedside. No sign of discomfort at this moment. Safety maintained on 1:1.

## 2017-10-04 NOTE — Progress Notes (Signed)
7AM: Patient laying in bed asleep; MHT/safety sitter within arms reach; No distress from patient noted, RN will continue to monitor.  8am: Patient received morning medications mixed into apple sauce. Patient took a cup sips of coffee, and drink all of her orange juice, cup of water; and ate a strip of bacon. Patient then closed her eyes and went back to sleep. No distress noted. MHT/safety sitter within arms reach.  9am: Patient went up to go to the bathroom, returned back to bed and went to sleep; No distress noted, safety-sitter within arms reach. Nurse continued to monitor.  10am: Patient asleep; breaths even and unlabored; safety sitter within arms reach.  11 am: Patient asleep; breaths are even and unlabored; safety sitter within arms reach. Nurse continues to monitor  12 pm: Patient asleep:breaths regular and even; Nurse tried to give patient  Noon medication patient refused; safety sitter within arms reach. Nurse will continue to monitor.  1P pm: Patient is alsleep in room, CNA/ Safety sitter within arms reach; no distress noted.  2PM: Patient alsleep, breaths even and unlabored,CNA/Safety sitter within arms reach; no distress noted. Nurse will continue to monitor.  3PM: Patient assisted to bathroom; patient went back into bed; patient eyes open in bed; no distress noted. CNA/safety sitter within arms reach. Nurse will continue to monitor.

## 2017-10-04 NOTE — Progress Notes (Signed)
1:1 Maintained Per MD orders for safety of the patient  2000: Pt. Observed in bed awake resting with 1:1 present.  2100: Pt. Observed sleeping   2200: Pt. With 1:1 eating snack and drinking fluids in bed 2300: Pt. Observed trying to sleep at this time 0000: Pt. Observed sleeping  0100: Pt. Observed sleeping  0200: Pt. Observed sleeping 0300: Pt. Observed sleeping 0400: Pt. Observed sleeping 0500: Pt. Observed sleeping 0600: Pt. Observed sleeping 0700: Pt. Observed sleeping

## 2017-10-04 NOTE — [facility] Group Notes (Signed)
[place] Group Notes:  (Nursing/MHT/Case Management/Adjunct)  Date:  10/04/2017  Time:  3:46 AM  Type of Therapy:  Group Therapy  Participation Level:  Did Not Attend  Summary of Progress/Problems:  Joyce [name] 10/04/2017, 3:46 AM

## 2017-10-04 NOTE — Progress Notes (Signed)
2000: Patient in bed awake. Calm and cooperative. Not willing to talk at this moment. Sitter remains at bedside for safety.

## 2017-10-05 ENCOUNTER — Inpatient Hospital Stay: Payer: Medicare HMO | Admitting: Anesthesiology

## 2017-10-05 ENCOUNTER — Encounter: Payer: Self-pay | Admitting: *Deleted

## 2017-10-05 ENCOUNTER — Other Ambulatory Visit: Payer: Self-pay | Admitting: Psychiatry

## 2017-10-05 DIAGNOSIS — F315 Bipolar disorder, current episode depressed, severe, with psychotic features: Secondary | ICD-10-CM | POA: Diagnosis not present

## 2017-10-05 DIAGNOSIS — F319 Bipolar disorder, unspecified: Secondary | ICD-10-CM | POA: Diagnosis not present

## 2017-10-05 DIAGNOSIS — I120 Hypertensive chronic kidney disease with stage 5 chronic kidney disease or end stage renal disease: Secondary | ICD-10-CM | POA: Diagnosis not present

## 2017-10-05 DIAGNOSIS — N186 End stage renal disease: Secondary | ICD-10-CM | POA: Diagnosis not present

## 2017-10-05 DIAGNOSIS — K219 Gastro-esophageal reflux disease without esophagitis: Secondary | ICD-10-CM | POA: Diagnosis not present

## 2017-10-05 LAB — POCT I-STAT 4, (NA,K, GLUC, HGB,HCT)
Glucose, Bld: 108 mg/dL — ABNORMAL HIGH (ref 65–99)
HEMATOCRIT: 31 % — AB (ref 36.0–46.0)
Hemoglobin: 10.5 g/dL — ABNORMAL LOW (ref 12.0–15.0)
Potassium: 4.1 mmol/L (ref 3.5–5.1)
Sodium: 141 mmol/L (ref 135–145)

## 2017-10-05 LAB — RENAL FUNCTION PANEL
ANION GAP: 16 — AB (ref 5–15)
Albumin: 3.5 g/dL (ref 3.5–5.0)
BUN: 86 mg/dL — ABNORMAL HIGH (ref 6–20)
CHLORIDE: 96 mmol/L — AB (ref 101–111)
CO2: 22 mmol/L (ref 22–32)
CREATININE: 11.38 mg/dL — AB (ref 0.44–1.00)
Calcium: 8.9 mg/dL (ref 8.9–10.3)
GFR, EST AFRICAN AMERICAN: 3 mL/min — AB (ref >60)
GFR, EST NON AFRICAN AMERICAN: 3 mL/min — AB (ref >60)
Glucose, Bld: 105 mg/dL — ABNORMAL HIGH (ref 65–99)
Phosphorus: 4.8 mg/dL — ABNORMAL HIGH (ref 2.5–4.6)
Potassium: 3.9 mmol/L (ref 3.5–5.1)
Sodium: 134 mmol/L — ABNORMAL LOW (ref 135–145)

## 2017-10-05 LAB — CBC
HCT: 31.7 % — ABNORMAL LOW (ref 35.0–47.0)
HEMOGLOBIN: 10.8 g/dL — AB (ref 12.0–16.0)
MCH: 32.3 pg (ref 26.0–34.0)
MCHC: 34.1 g/dL (ref 32.0–36.0)
MCV: 94.8 fL (ref 80.0–100.0)
PLATELETS: 364 10*3/uL (ref 150–440)
RBC: 3.35 MIL/uL — AB (ref 3.80–5.20)
RDW: 14.1 % (ref 11.5–14.5)
WBC: 11.3 10*3/uL — AB (ref 3.6–11.0)

## 2017-10-05 LAB — GLUCOSE, CAPILLARY: Glucose-Capillary: 104 mg/dL — ABNORMAL HIGH (ref 65–99)

## 2017-10-05 MED ORDER — ONDANSETRON HCL 4 MG/2ML IJ SOLN: 4.0000 mg | Freq: Once | INTRAMUSCULAR | Status: DC | PRN

## 2017-10-05 MED ORDER — METHOHEXITAL SODIUM 100 MG/10ML IV SOSY
PREFILLED_SYRINGE | INTRAVENOUS | Status: DC | PRN
  Administered 2017-10-05: 70 mg via INTRAVENOUS

## 2017-10-05 MED ORDER — SODIUM CHLORIDE 0.9 % IV SOLN
500.0000 mL | Freq: Once | INTRAVENOUS | Status: AC
  Administered 2017-10-05: 500 mL via INTRAVENOUS

## 2017-10-05 MED ORDER — SUCCINYLCHOLINE CHLORIDE 20 MG/ML IJ SOLN
INTRAMUSCULAR | Status: DC | PRN
  Administered 2017-10-05: 80 mg via INTRAVENOUS

## 2017-10-05 MED ORDER — SODIUM CHLORIDE 0.9 % IV SOLN
INTRAVENOUS | Status: DC | PRN
  Administered 2017-10-05: 11:00:00 via INTRAVENOUS

## 2017-10-05 MED ORDER — METHOHEXITAL SODIUM 0.5 G IJ SOLR
INTRAMUSCULAR | Status: AC
  Filled 2017-10-05: qty 500

## 2017-10-05 MED ORDER — FENTANYL CITRATE (PF) 100 MCG/2ML IJ SOLN: 25.0000 ug | INTRAMUSCULAR | Status: DC | PRN

## 2017-10-05 NOTE — Progress Notes (Signed)
Patient remains asleep in bed. Staff continue to monitor on 1:1.

## 2017-10-05 NOTE — Progress Notes (Signed)
Patient remains in bed, awake. CBG : 104. Patient's safety maintained per 1:1 level of observation.

## 2017-10-05 NOTE — Progress Notes (Signed)
Sleep without any discomfort. Staff continue to monitor on 1:1.

## 2017-10-05 NOTE — Progress Notes (Addendum)
Patient in bed resting. No sign of distress. Remains on 1:1 for safety precautions.

## 2017-10-05 NOTE — Progress Notes (Signed)
Hourly rounding 0800 Resting in bed with eyes closed. Sitter at beside.   0900 Off unit in ECT. 1000 Off unit in ECT. 1100 Off unit in ECT. 1200 Off unit in ECT.  1300 Off unit Dialysis.  1:1 sitter maintained.   1400 Patient in bed, restless.  1:1 sitter maintained. 1500 Resting in bed.  Sitter continued.   1600 In bed.  Restless.  Sitter maintained.   1700   Eating dinner.  Sitter assisting. 1800 Resting in bed.  1:1 continued for safety.   1900 Resting in bed.  1:1 continued for safety

## 2017-10-05 NOTE — Plan of Care (Signed)
Pt stayed in bed, sad and depressed. Did not engage in conversation.

## 2017-10-05 NOTE — Progress Notes (Signed)
0600: Patient was awake for a few hours after taking medications. Was able to sleep for 4hrs10mn. Currently in bed awake. Oriented to person but not engaged in conversations. Safety maintained  at 1:1 level of observation as ordered.

## 2017-10-05 NOTE — Progress Notes (Signed)
Transported to ECT.

## 2017-10-05 NOTE — Progress Notes (Signed)
Patient asleep, safe in bed. Staff continue to monitor on 1:1 for safety.

## 2017-10-05 NOTE — Anesthesia Procedure Notes (Signed)
Procedure Name: General with mask airway Performed by: Aline Brochure, CRNA Pre-anesthesia Checklist: Patient identified, Emergency Drugs available, Suction available, Patient being monitored and Timeout performed Patient Re-evaluated:Patient Re-evaluated prior to induction Oxygen Delivery Method: Circle system utilized Preoxygenation: Pre-oxygenation with 100% oxygen Induction Type: IV induction Ventilation: Mask ventilation without difficulty Airway Equipment and Method: Bite block

## 2017-10-05 NOTE — Progress Notes (Addendum)
ECT today.  Tolerated.  Verbalizes that she is not doing well and wants to go home.  Refused dialysis.  Drinking water.  Appetite poor.  Eats few bites with encouragement.  Oriented to self only.  Delayed responses with communication.  Restless in bed.   1807 patient agreeable to take pm medication.  Ate a cup of ice cream for dinner.

## 2017-10-05 NOTE — Procedures (Signed)
ECT SERVICES Physician's Interval Evaluation & Treatment Note  Patient Identification: Briseyda Fehr MRN:  [id number] Date of Evaluation:  10/05/2017 TX #: 2  MADRS:   MMSE:   P.E. Findings:  Patient's heart and lungs normal vitals normal  Psychiatric Interval Note:  Very withdrawn speaks a little very little activity follow some commands.  Subjective:  Patient is a 72 y.o. female seen for evaluation for Electroconvulsive Therapy. No specific complaint  Treatment Summary:   []   Right Unilateral             [x]  Bilateral   % Energy : 1.0 ms 50%   Impedance: 2100 ohms  Seizure Energy Index: 9533 V squared  Postictal Suppression Index: 63%  Seizure Concordance Index: 93%  Medications  Pre Shock: Brevital 70 mg succinylcholine 80 mg  Post Shock:    Seizure Duration: 35 seconds EMG 44 seconds EEG   Comments: Follow-up Wednesday and Friday  Lungs:  [x]   Clear to auscultation               []  Other:   Heart:    [x]   Regular rhythm             []  irregular rhythm    [x]   Previous H&P reviewed, patient examined and there are NO CHANGES                 []   Previous H&P reviewed, patient examined and there are changes noted.   Alethia Berthold, MD 3/18/201910:34 AM

## 2017-10-05 NOTE — Anesthesia Post-op Follow-up Note (Signed)
Anesthesia QCDR form completed.        

## 2017-10-05 NOTE — Progress Notes (Signed)
[facility]  ROUNDING NOTE   Subjective:   Patient underwent she presented to the dialysis suite after her treatment ECT this morning She was cannulated but moved her arm therefore dialysis could not be performed due to infiltration Patient denies any shortness of breath She does not have any significant leg edema    Objective:  Vital signs in last 24 hours:  Temp:  [98.9 F (37.2 C)-99.1 F (37.3 C)] 98.9 F (37.2 C) (03/18 1135) Pulse Rate:  [64-108] 101 (03/18 1135) Resp:  [14-24] 19 (03/18 1135) BP: (92-159)/(54-87) 140/71 (03/18 1135) SpO2:  [95 %-99 %] 99 % (03/18 1135)  Weight change:  Filed Weights    Intake/Output: I/O last 3 completed shifts: In: 600 [P.O.:600] Out: -    Intake/Output this shift:  Total I/O In: 30 [I.V.:30] Out: -   Physical Exam: General: NAD, Sitting up in chair  Head: Normocephalic, atraumatic. Moist oral mucosal membranes  Eyes: Anicteric  Neck: Supple, trachea midline  Lungs:  Clear to auscultation  Heart: Regular rate and rhythm  Abdomen:  Soft, nontender  Extremities: no peripheral edema.  Psych:  Flat affect. Slow to respond  Neurologic: Nonfocal, moving all four extremities  Skin: No lesions  Access: Left AVF, good bruit    Basic Metabolic Panel: Recent Labs  Lab 10/01/17 0711 10/02/17 1559 10/05/17 0941  NA 141 143 141  K 4.0 4.1 4.1  CL 99* 101  --   CO2 23 22  --   GLUCOSE 117* 113* 108*  BUN 51* 76*  --   CREATININE 7.39* 9.74*  --   CALCIUM 10.3 9.7  --   PHOS 4.0 4.7*  --     Liver Function Tests: Recent Labs  Lab 10/01/17 0711 10/02/17 1559  ALBUMIN 4.2 4.0   No results for input(s): LIPASE, AMYLASE in the last 168 hours. Recent Labs  Lab 10/01/17 0711  AMMONIA 16    CBC: Recent Labs  Lab 10/01/17 0711 10/02/17 1559 10/05/17 0941  WBC 10.7 11.5*  --   HGB 13.1 12.1 10.5*  HCT 39.0 37.1 31.0*  MCV 95.4 96.9  --   PLT 367 385  --     Cardiac Enzymes: No results for  input(s): CKTOTAL, CKMB, CKMBINDEX, TROPONINI in the last 168 hours.  BNP: Invalid input(s): POCBNP  CBG: Recent Labs  Lab 10/02/17 0610 10/02/17 2122 10/05/17 0702  GLUCAP 104* 103* 104*    Microbiology: Results for orders placed or performed during the hospital encounter of 09/01/17  Urine culture     Status: Abnormal   Collection Time: 09/01/17  4:32 PM  Result Value Ref Range Status   Specimen Description   Final    URINE, RANDOM Performed at [facility], [address], [place], [place] [address]    Special Requests   Final    NONE Performed at [facility], [address] [place], [place] [address]    Culture MULTIPLE SPECIES PRESENT, SUGGEST RECOLLECTION (A)  Final   Report Status 09/03/2017 FINAL  Final  Culture, blood (routine x 2)     Status: None   Collection Time: 09/01/17  6:05 PM  Result Value Ref Range Status   Specimen Description BLOOD RIGHT HAND  Final   Special Requests   Final    BOTTLES DRAWN AEROBIC AND ANAEROBIC Blood Culture results may not be optimal due to an excessive volume of blood received in culture bottles   Culture   Final    NO  GROWTH 5 DAYS Performed at [facility], [address] [place], [place] [address]    Report Status 09/06/2017 FINAL  Final  Culture, blood (routine x 2)     Status: None   Collection Time: 09/01/17  6:34 PM  Result Value Ref Range Status   Specimen Description BLOOD RAC  Final   Special Requests   Final    BOTTLES DRAWN AEROBIC AND ANAEROBIC Blood Culture adequate volume   Culture   Final    NO GROWTH 5 DAYS Performed at [facility], [address] [place], [place] [address]    Report Status 09/06/2017 FINAL  Final    Coagulation Studies: No results for input(s): LABPROT, INR in the last 72 hours.  Urinalysis: No results for input(s): COLORURINE, LABSPEC, PHURINE, GLUCOSEU, HGBUR, BILIRUBINUR, KETONESUR, PROTEINUR, UROBILINOGEN, NITRITE, LEUKOCYTESUR in the  last 72 hours.  Invalid input(s): APPERANCEUR    Imaging: No results found.   Medications:    . brimonidine  1 drop Both Eyes BID  . [START ON 10/17/2017] cyanocobalamin  1,000 mcg Intramuscular Q30 days  . dicyclomine  20 mg Oral TID AC  . lidocaine-prilocaine  1 application Topical Q M,W,F  . metoprolol tartrate  25 mg Oral BID  . midodrine  10 mg Oral TID WC  . multivitamin  1 tablet Oral QHS  . OLANZapine zydis  5 mg Oral TID  . pantoprazole  40 mg Oral Daily  . timolol  1 drop Both Eyes BID  . vitamin C  500 mg Oral BID   acetaminophen, alum & mag hydroxide-simeth, bisacodyl, haloperidol **OR** haloperidol lactate, magnesium hydroxide  Assessment/ Plan:  [name] is a 72 y.o. white female with end stage renal disease on hemodialysis, diverticulosis, hypotension, bipolar disorder, polycystic kidney disease   [facility]/MWF  1.  ESRD on HD MWF.  Seen and examined on hemodialysis. Unfortunately having difficulties with patient's fistula.  Continue MWF schedule.  Unfortunately patient moved her arm today and access might have infiltrated.  We will rest the access today and attempt hemodialysis again tomorrow. Potassium is normal at 4.1  2.  Anemia of CKD: hemoglobin  10.5 -We will resume Epogen with hemodialysis treatments  3.  Secondary hyperparathyroidism:  Not currently on binders.   4. Hypotension:  continue midodrine prior to dialysis.   5. Delirium, bipolar affective disorder, major depression Psychiatry evaluation ongoing Currently patient is undergoing ECT treatments   LOS: 18 Jermario Kalmar 3/18/20193:44 PM

## 2017-10-05 NOTE — Progress Notes (Signed)
[facility] MD Progress Note  10/05/2017 9:28 PM Joyce [name]  MRN:  [id number] Subjective: Follow-up 72 year old woman with bipolar disorder.  Patient had ECT today.  Treatment itself ultimately was tolerated well.  Had bilateral treatment.  Seen this afternoon the patient was able to communicate and speak although she still says she is feeling very bad.  8 a few bites today a little bit more than she had previously.  Patient was taken for dialysis today but moved to during the treatment making it impossible for them to do the full dialysis. Principal Problem: Bipolar affective disorder, current episode manic with psychotic symptoms ([place]) Diagnosis:   Patient Active Problem List   Diagnosis Date Noted  . Palliative care encounter [Z51.5]   . Bipolar affective disorder, current episode depressed with psychotic symptoms ([place]) [F31.2] 09/17/2017  . Bipolar I disorder, most recent episode (or current) manic ([place]) [F31.10] 09/02/2017  . Acute delirium [R41.0] 09/02/2017  . Altered mental status [R41.82]   . Acute encephalopathy [G93.40] 09/01/2017  . ESRD on dialysis ([place]) [N18.6, Z99.2] 04/06/2017  . Complication of vascular access for dialysis [T82.9XXA] 04/06/2017  . Hematuria [R31.9] 12/18/2016  . Polycystic kidney [Q61.3] 10/12/2016  . Ruptured cyst of kidney [Q61.00] 10/03/2016  . HTN (hypertension) [I10] 10/01/2016  . Chronic kidney disease [N18.9] 10/01/2016  . Anemia associated with chronic renal failure [D63.1] 09/24/2016  . Acute kidney insufficiency [N28.9] 07/28/2016  . Noninfectious diarrhea [K52.9]   . Benign neoplasm of cecum [D12.0]   . Benign neoplasm of ascending colon [D12.2]   . Diarrhea [R19.7]   . Nausea [R11.0]   . Gastric polyp [K31.7]   . Chronic constipation [K59.09] 12/27/2014  . Nausea with vomiting [R11.2] 12/27/2014  . Left sided abdominal pain [R10.9] 12/27/2014  . Personal history of colonic polyps [Z86.010] 05/10/2013   Total Time spent with patient: 30  minutes  Past Psychiatric History: Patient has a history of bipolar disorder but this is what seems like probably the worst spell she has had.  Past Medical History:  Past Medical History:  Diagnosis Date  . Anal fissure   . Bipolar affective disorder ([place])   . CKD (chronic kidney disease)    Dr Holley Raring [name] 4  . Colon polyps   . Diverticulitis   . Diverticulitis   . Family history of adverse reaction to anesthesia    mom - PONV  . GERD (gastroesophageal reflux disease)   . Headache    migraines - none over 10 yrs  . Heart murmur   . History of hiatal hernia   . Hypertension   . Pancreatitis    Valproic acid  . Vertigo     Past Surgical History:  Procedure Laterality Date  . A/V FISTULAGRAM Left 04/14/2017   Procedure: A/V Fistulagram;  Surgeon: Katha Cabal, MD;  Location: [facility];  Service: Cardiovascular;  Laterality: Left;  . A/V FISTULAGRAM Left 06/09/2017   Procedure: A/V FISTULAGRAM;  Surgeon: Katha Cabal, MD;  Location: [facility];  Service: Cardiovascular;  Laterality: Left;  . ABDOMINAL HYSTERECTOMY  1990 ?  . AV FISTULA PLACEMENT  4/30  . BREAST EXCISIONAL BIOPSY Left 1994   neg surgical bx  . CHOLECYSTECTOMY  2003  . COLONOSCOPY  2014   Dr. Jamal Collin  . COLONOSCOPY WITH PROPOFOL N/A 09/24/2015   Procedure: COLONOSCOPY WITH random colon byopies.;  Surgeon: Lucilla Lame, MD;  Location: [facility];  Service: Endoscopy;  Laterality: N/A;  . ESOPHAGOGASTRODUODENOSCOPY (EGD) WITH  PROPOFOL N/A 09/24/2015   Procedure: ESOPHAGOGASTRODUODENOSCOPY (EGD) ;  Surgeon: Lucilla Lame, MD;  Location: [facility];  Service: Endoscopy;  Laterality: N/A;  . EYE SURGERY    . PERIPHERAL VASCULAR CATHETERIZATION N/A 05/29/2015   Procedure: A/V Shuntogram/Fistulagram;  Surgeon: Katha Cabal, MD;  Location: [facility];  Service: Cardiovascular;  Laterality: N/A;  . PERIPHERAL VASCULAR CATHETERIZATION N/A 05/29/2015   Procedure:  A/V Shunt Intervention;  Surgeon: Katha Cabal, MD;  Location: [facility];  Service: Cardiovascular;  Laterality: N/A;  . POLYPECTOMY  09/24/2015   Procedure: POLYPECTOMY INTESTINAL;  Surgeon: Lucilla Lame, MD;  Location: [facility];  Service: Endoscopy;;  cecal polyp ascending polyp   Family History:  Family History  Problem Relation Age of Onset  . Stroke Father   . Hypertension Father   . Breast cancer Other   . Colon cancer Neg Hx   . Liver disease Neg Hx    Family Psychiatric  History: Negative Social History:  Social History   Substance and Sexual Activity  Alcohol Use No  . Alcohol/week: 0.0 oz     Social History   Substance and Sexual Activity  Drug Use No    Social History   Socioeconomic History  . Marital status: Single    Spouse name: None  . Number of children: 1  . Years of education: None  . Highest education level: None  Social Needs  . Financial resource strain: None  . Food insecurity - worry: None  . Food insecurity - inability: None  . Transportation needs - medical: None  . Transportation needs - non-medical: None  Occupational History  . Occupation: Surveyor, quantity: [employer]  Tobacco Use  . Smoking status: Never Smoker  . Smokeless tobacco: Never Used  Substance and Sexual Activity  . Alcohol use: No    Alcohol/week: 0.0 oz  . Drug use: No  . Sexual activity: Not Currently  Other Topics Concern  . 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   Additional Social History:    History of alcohol / drug use?: No history of alcohol / drug abuse                    Sleep: Fair  Appetite:  Poor  Current Medications: Current Facility-Administered Medications  Medication Dose Route Frequency Provider Last Rate Last Dose  . acetaminophen (TYLENOL) tablet 325 mg  325 mg Oral BID PRN Carmilla [place], [name], MD   325 mg at 10/05/17 1540   . alum & mag hydroxide-simeth (MAALOX/MYLANTA) 200-200-20 MG/5ML suspension 30 mL  30 mL Oral Q4H PRN Zalma Channing T, MD      . bisacodyl (DULCOLAX) EC tablet 5 mg  5 mg Oral Daily PRN Recardo Linn T, MD      . brimonidine (ALPHAGAN) 0.2 % ophthalmic solution 1 drop  1 drop Both Eyes BID Katria Botts, [name], MD   1 drop at 10/05/17 1745  . [START ON 10/17/2017] cyanocobalamin ((VITAMIN B-12)) injection 1,000 mcg  1,000 mcg Intramuscular Q30 days Greenly Rarick T, MD      . dicyclomine (BENTYL) tablet 20 mg  20 mg Oral TID AC Kennia Vanvorst T, MD   20 mg at 10/05/17 1744  . haloperidol (HALDOL) tablet 5 mg  5 mg Oral Q8H PRN [name], [name], MD   5 mg at 10/05/17 1540  Or  . haloperidol lactate (HALDOL) injection 5 mg  5 mg Intramuscular Q8H PRN [name], [name], MD   5 mg at 10/01/17 0908  . lidocaine-prilocaine (EMLA) cream 1 application  1 application Topical Q M,W,F Shevaun Lovan, [name], MD   1 application at 09/73/53 1219  . magnesium hydroxide (MILK OF MAGNESIA) suspension 30 mL  30 mL Oral Daily PRN Waymon Laser T, MD      . metoprolol tartrate (LOPRESSOR) tablet 25 mg  25 mg Oral BID Gladstone Lighter, MD   25 mg at 10/05/17 1744  . midodrine (PROAMATINE) tablet 10 mg  10 mg Oral TID WC [name], MD   10 mg at 10/05/17 1744  . multivitamin (RENA-VIT) tablet 1 tablet  1 tablet Oral QHS [name], [name], MD   1 tablet at 10/04/17 2200  . OLANZapine zydis (ZYPREXA) disintegrating tablet 5 mg  5 mg Oral TID [name], [name], MD   5 mg at 10/05/17 1744  . pantoprazole (PROTONIX) EC tablet 40 mg  40 mg Oral Daily Lavida Patch, [name], MD   40 mg at 10/04/17 0759  . timolol (TIMOPTIC) 0.5 % ophthalmic solution 1 drop  1 drop Both Eyes BID Tonae Livolsi, [name], MD   1 drop at 10/05/17 1745  . vitamin C (ASCORBIC ACID) tablet 500 mg  500 mg Oral BID [name], [name], MD   500 mg at 10/05/17 1744    Lab Results:  Results for orders placed or performed during the hospital encounter  of 09/17/17 (from the past 48 hour(s))  Glucose, capillary     Status: Abnormal   Collection Time: 10/05/17  7:02 AM  Result Value Ref Range   Glucose-Capillary 104 (H) 65 - 99 mg/dL   Comment 1 Notify RN   I-STAT 4, (NA,K, GLUC, HGB,HCT)     Status: Abnormal   Collection Time: 10/05/17  9:41 AM  Result Value Ref Range   Sodium 141 135 - 145 mmol/L   Potassium 4.1 3.5 - 5.1 mmol/L   Glucose, Bld 108 (H) 65 - 99 mg/dL   HCT 31.0 (L) 36.0 - 46.0 %   Hemoglobin 10.5 (L) 12.0 - 15.0 g/dL  Renal function panel     Status: Abnormal   Collection Time: 10/05/17  7:41 PM  Result Value Ref Range   Sodium 134 (L) 135 - 145 mmol/L   Potassium 3.9 3.5 - 5.1 mmol/L   Chloride 96 (L) 101 - 111 mmol/L   CO2 22 22 - 32 mmol/L   Glucose, Bld 105 (H) 65 - 99 mg/dL   BUN 86 (H) 6 - 20 mg/dL   Creatinine, Ser 11.38 (H) 0.44 - 1.00 mg/dL   Calcium 8.9 8.9 - 10.3 mg/dL   Phosphorus 4.8 (H) 2.5 - 4.6 mg/dL   Albumin 3.5 3.5 - 5.0 g/dL   GFR calc non Af Amer 3 (L) >60 mL/min   GFR calc Af Amer 3 (L) >60 mL/min    Comment: (NOTE) The eGFR has been calculated using the CKD EPI equation. This calculation has not been validated in all clinical situations. eGFR's persistently <60 mL/min signify possible Chronic Kidney Disease.    Anion gap 16 (H) 5 - 15    Comment: Performed at [facility], [address] [place], [place] [address]  CBC     Status: Abnormal   Collection Time: 10/05/17  7:41 PM  Result Value Ref Range   WBC 11.3 (H) 3.6 - 11.0 K/uL   RBC  3.35 (L) 3.80 - 5.20 MIL/uL   Hemoglobin 10.8 (L) 12.0 - 16.0 g/dL   HCT 31.7 (L) 35.0 - 47.0 %   MCV 94.8 80.0 - 100.0 fL   MCH 32.3 26.0 - 34.0 pg   MCHC 34.1 32.0 - 36.0 g/dL   RDW 14.1 11.5 - 14.5 %   Platelets 364 150 - 440 K/uL    Comment: Performed at [facility], [address] [place], [place] [address]    Blood Alcohol level:  Lab Results  Component Value Date   [facility] <10 12/45/8099    Metabolic Disorder  Labs: Lab Results  Component Value Date   HGBA1C 4.8 09/18/2017   MPG 91.06 09/18/2017   No results found for: PROLACTIN Lab Results  Component Value Date   CHOL 251 (H) 09/18/2017   TRIG 124 09/18/2017   HDL 66 09/18/2017   CHOLHDL 3.8 09/18/2017   VLDL 25 09/18/2017   LDLCALC 160 (H) 09/18/2017   LDLCALC 43 05/14/2014    Physical Findings: AIMS: Facial and Oral Movements Muscles of Facial Expression: None, normal Lips and Perioral Area: None, normal Jaw: None, normal Tongue: None, normal,Extremity Movements Upper (arms, wrists, hands, fingers): None, normal Lower (legs, knees, ankles, toes): None, normal, Trunk Movements Neck, shoulders, hips: None, normal, Overall Severity Severity of abnormal movements (highest score from questions above): None, normal Incapacitation due to abnormal movements: None, normal Patient's awareness of abnormal movements (rate only patient's report): No Awareness, Dental Status Current problems with teeth and/or dentures?: No Does patient usually wear dentures?: No  CIWA:    COWS:     Musculoskeletal: Strength & Muscle Tone: abnormal Gait & Station: unable to stand Patient leans: N/A  Psychiatric Specialty Exam: Physical Exam  Nursing note and vitals reviewed. Constitutional: She appears well-developed and well-nourished.  HENT:  Head: Normocephalic and atraumatic.  Eyes: Conjunctivae are normal. Pupils are equal, round, and reactive to light.  Neck: Normal range of motion.  Cardiovascular: Regular rhythm and normal heart sounds.  Respiratory: Effort normal. No respiratory distress.  GI: Soft.  Musculoskeletal: Normal range of motion.  Neurological: She is alert.  Skin: Skin is warm and dry.  Psychiatric: Her affect is blunt. Her speech is delayed. She is withdrawn. She exhibits a depressed mood.    Review of Systems  Unable to perform ROS: Psychiatric disorder    Blood pressure (!) 125/47, pulse 70, temperature 97.9 F (36.6  C), temperature source Oral, resp. rate 20, height '5\' 3"'$  (1.6 m), weight 72.6 kg (160 lb), SpO2 95 %.Body mass index is 28.34 kg/m.  General Appearance: Disheveled  Eye Contact:  Minimal  Speech:  Slow  Volume:  Decreased  Mood:  Depressed  Affect:  Congruent  Thought Process:  Disorganized  Orientation:  Negative  Thought Content:  Illogical  Suicidal Thoughts:  No  Homicidal Thoughts:  No  Memory:  Negative  Judgement:  Poor  Insight:  Lacking  Psychomotor Activity:  Decreased  Concentration:  Concentration: Poor  Recall:  Poor  Fund of Knowledge:  Poor  Language:  Poor  Akathisia:  No  Handed:  Right  AIMS (if indicated):     Assets:  Resilience Social Support  ADL's:  Impaired  Cognition:  Impaired,  Mild and Moderate  Sleep:  Number of Hours: 4.45     Treatment Plan Summary: Daily contact with patient to assess and evaluate symptoms and progress in treatment, Medication management and Plan Patient is tolerating ECT.  I think there is  sign of some improvement.  We hope that we will be able to see more complete improvement by later this week.  Patient has gone many days now without dialysis and although her potassium remains stable I am concerned about how this is affecting her mental state as well.  I hope she is able to get dialysis tomorrow.  Encourage patient to eat as much as possible.  Alethia Berthold, MD 10/05/2017, 9:28 PM

## 2017-10-05 NOTE — Progress Notes (Signed)
Patient remains asleep. No sign of distress noted. Safety maintained on 1:1.

## 2017-10-05 NOTE — Transfer of Care (Signed)
Immediate Anesthesia Transfer of Care Note  Patient: Joyce [name]  Procedure(s) Performed: ECT TX  Patient Location: PACU  Anesthesia Type:General  Level of Consciousness: awake, alert  and oriented  Airway & Oxygen Therapy: Patient Spontanous Breathing and Patient connected to face mask oxygen  Post-op Assessment: Report given to RN and Post -op Vital signs reviewed and stable  Post vital signs: Reviewed and stable  Last Vitals:  Vitals:   10/05/17 0926 10/05/17 1055  BP: (!) 112/54 (!) 159/63  Pulse: 64 93  Resp: 18 14  Temp:  37.3 C  SpO2: 98% 98%    Last Pain:  Vitals:   10/05/17 0911  TempSrc: Oral  PainSc:          Complications: No apparent anesthesia complications

## 2017-10-05 NOTE — Progress Notes (Signed)
Patient currently in bed sleeping. No sign of discomfort. Safety maintained on 1:1.

## 2017-10-05 NOTE — H&P (Signed)
Joyce [name] is an 72 y.o. female.   Chief Complaint: Recent has no specific complaint  HPI: History of bipolar disorder patient with severely withdrawn depressed psychotic state Past Medical History:  Diagnosis Date  . Anal fissure   . Bipolar affective disorder ([place])   . CKD (chronic kidney disease)    Dr Holley Raring [name] 4  . Colon polyps   . Diverticulitis   . Diverticulitis   . Family history of adverse reaction to anesthesia    mom - PONV  . GERD (gastroesophageal reflux disease)   . Headache    migraines - none over 10 yrs  . Heart murmur   . History of hiatal hernia   . Hypertension   . Pancreatitis    Valproic acid  . Vertigo     Past Surgical History:  Procedure Laterality Date  . A/V FISTULAGRAM Left 04/14/2017   Procedure: A/V Fistulagram;  Surgeon: Katha Cabal, MD;  Location: [facility];  Service: Cardiovascular;  Laterality: Left;  . A/V FISTULAGRAM Left 06/09/2017   Procedure: A/V FISTULAGRAM;  Surgeon: Katha Cabal, MD;  Location: [facility];  Service: Cardiovascular;  Laterality: Left;  . ABDOMINAL HYSTERECTOMY  1990 ?  . AV FISTULA PLACEMENT  4/30  . BREAST EXCISIONAL BIOPSY Left 1994   neg surgical bx  . CHOLECYSTECTOMY  2003  . COLONOSCOPY  2014   Dr. Jamal Collin  . COLONOSCOPY WITH PROPOFOL N/A 09/24/2015   Procedure: COLONOSCOPY WITH random colon byopies.;  Surgeon: Lucilla Lame, MD;  Location: [facility];  Service: Endoscopy;  Laterality: N/A;  . ESOPHAGOGASTRODUODENOSCOPY (EGD) WITH PROPOFOL N/A 09/24/2015   Procedure: ESOPHAGOGASTRODUODENOSCOPY (EGD) ;  Surgeon: Lucilla Lame, MD;  Location: [facility];  Service: Endoscopy;  Laterality: N/A;  . EYE SURGERY    . PERIPHERAL VASCULAR CATHETERIZATION N/A 05/29/2015   Procedure: A/V Shuntogram/Fistulagram;  Surgeon: Katha Cabal, MD;  Location: [facility];  Service: Cardiovascular;  Laterality: N/A;  . PERIPHERAL VASCULAR CATHETERIZATION N/A  05/29/2015   Procedure: A/V Shunt Intervention;  Surgeon: Katha Cabal, MD;  Location: [facility];  Service: Cardiovascular;  Laterality: N/A;  . POLYPECTOMY  09/24/2015   Procedure: POLYPECTOMY INTESTINAL;  Surgeon: Lucilla Lame, MD;  Location: [facility];  Service: Endoscopy;;  cecal polyp ascending polyp    Family History  Problem Relation Age of Onset  . Stroke Father   . Hypertension Father   . Breast cancer Other   . Colon cancer Neg Hx   . Liver disease Neg Hx    Social History:  reports that  has never smoked. she has never used smokeless tobacco. She reports that she does not drink alcohol or use drugs.  Allergies:  Allergies  Allergen Reactions  . Morphine And Related Shortness Of Breath    Pt reports chest pain and difficulty breathing.  . Indomethacin Hives  . Pollen Extract Other (See Comments)    Sinus problems and HA    Medications Prior to Admission  Medication Sig Dispense Refill  . acetaminophen (TYLENOL) 325 MG tablet Take 325 mg 2 (two) times daily as needed by mouth for moderate pain or headache.     . ARIPiprazole (ABILIFY) 2 MG tablet Take 2 mg by mouth daily.    . bisacodyl (DULCOLAX) 5 MG EC tablet Take 1 tablet (5 mg total) by mouth daily as needed for moderate constipation. 30 tablet 0  . brimonidine (ALPHAGAN) 0.2 % ophthalmic solution Place  2 (two) times daily into both eyes.    . carbamazepine (TEGRETOL) 200 MG tablet Take 200 mg by mouth 2 (two) times daily.    . cephALEXin (KEFLEX) 500 MG capsule Take 1 capsule (500 mg total) by mouth every 12 (twelve) hours. 5 capsule 0  . cholecalciferol (VITAMIN D) 1000 UNITS tablet Take 1,000 Units by mouth daily.    . cyanocobalamin (,VITAMIN B-12,) 1000 MCG/ML injection Inject 1,000 mcg into the muscle every 30 (thirty) days.    Marland Kitchen dicyclomine (BENTYL) 20 MG tablet Take 1 tablet (20 mg total) by mouth 3 (three) times daily before meals. 30 tablet 0  . lidocaine-prilocaine (EMLA) cream  Apply 1 application every Monday, Wednesday, and Friday topically. At dialysis  3  . midodrine (PROAMATINE) 10 MG tablet Take 10 mg by mouth daily.     . pantoprazole (PROTONIX) 40 MG tablet TAKE 1 TABLET EVERY DAY 90 tablet 3  . pramipexole (MIRAPEX) 0.125 MG tablet Take 0.125 mg by mouth daily at 2 PM.  3  . QUEtiapine (SEROQUEL) 25 MG tablet Take 50 mg by mouth at bedtime.     . timolol (TIMOPTIC) 0.5 % ophthalmic solution Place 1 drop 2 (two) times daily into both eyes.       Results for orders placed or performed during the hospital encounter of 09/17/17 (from the past 48 hour(s))  Glucose, capillary     Status: Abnormal   Collection Time: 10/05/17  7:02 AM  Result Value Ref Range   Glucose-Capillary 104 (H) 65 - 99 mg/dL   Comment 1 Notify RN    No results found.  Review of Systems  Unable to perform ROS: Patient unresponsive    Blood pressure (!) 112/54, pulse 64, temperature 98.6 F (37 C), temperature source Oral, resp. rate 18, height 5\' 3"  (1.6 m), weight 72.6 kg (160 lb), SpO2 98 %. Physical Exam  Nursing note and vitals reviewed. Constitutional: She appears well-developed and well-nourished.  HENT:  Head: Normocephalic and atraumatic.  Eyes: Conjunctivae are normal. Pupils are equal, round, and reactive to light.  Neck: Normal range of motion.  Cardiovascular: Regular rhythm and normal heart sounds.  Respiratory: Effort normal. No respiratory distress.  GI: Soft.  Musculoskeletal: Normal range of motion.  Neurological: She is alert.  Skin: Skin is warm and dry.  Psychiatric: Her affect is blunt. Her speech is delayed. She is slowed and withdrawn. Cognition and memory are impaired.     Assessment/Plan Continuing ECT for catatonia and depression Next treatment Wednesday and Friday bilateral treatment  Alethia Berthold, MD 10/05/2017, 10:32 AM

## 2017-10-05 NOTE — Plan of Care (Signed)
  Progressing Pain Managment: General experience of comfort will improve 10/05/2017 1315 - Progressing by Rise Mu, RN Note Denies any pain or discomfort Health Behavior/Discharge Planning: Compliance with prescribed medication regimen will improve 10/05/2017 1315 - Progressing by Rise Mu, RN Note Reluctant to take medication.  Takes with encouragement in applesauce Safety: Ability to remain free from injury will improve 10/05/2017 1315 - Progressing by Rise Mu, RN Note Remains safe on the unit   Not Progressing Nutritional: Ability to achieve adequate nutritional intake will improve 10/05/2017 1315 - Not Progressing by Rise Mu, RN Note Appetite poor.  Takes sips of water Activity: Ability to tolerate increased activity will improve 10/05/2017 1315 - Not Progressing by Rise Mu, RN Note Stays in bed Fluid Volume: Compliance with measures to maintain balanced fluid volume will improve 10/05/2017 1315 - Not Progressing by Rise Mu, RN Spiritual Needs Ability to function at adequate level 10/05/2017 1315 - Not Progressing by Rise Mu, RN Note Stays in bed.  Not able to perform ADL's on own

## 2017-10-05 NOTE — Plan of Care (Signed)
Patient currently in ECT at this time. Spoke with psychiatrist, patient showing improvement, appetite is increasing. Patient had dialysis Friday, but had to be discontinued due to excessive venous pressures.

## 2017-10-05 NOTE — Anesthesia Postprocedure Evaluation (Signed)
Anesthesia Post Note  Patient: Joyce [name]  Procedure(s) Performed: ECT TX  Patient location during evaluation: PACU Anesthesia Type: General Level of consciousness: awake and alert Pain management: pain level controlled Vital Signs Assessment: post-procedure vital signs reviewed and stable Respiratory status: spontaneous breathing, nonlabored ventilation, respiratory function stable and patient connected to nasal cannula oxygen Cardiovascular status: blood pressure returned to baseline and stable Postop Assessment: no apparent nausea or vomiting Anesthetic complications: no     Last Vitals:  Vitals:   10/05/17 1125 10/05/17 1135  BP: 92/64 140/71  Pulse: 99 (!) 101  Resp: (!) 23 19  Temp:  37.2 C  SpO2: 95% 99%    Last Pain:  Vitals:   10/05/17 1135  TempSrc:   PainSc: 0-No pain                 Joyce [name] S

## 2017-10-05 NOTE — Anesthesia Preprocedure Evaluation (Addendum)
Anesthesia Evaluation  Patient identified by MRN, date of birth, ID band Patient awake    Reviewed: Allergy & Precautions, NPO status , Patient's Chart, lab work & pertinent test results, reviewed documented beta blocker date and time   History of Anesthesia Complications (+) Family history of anesthesia reaction  Airway Mallampati: II  TM Distance: >3 FB     Dental  (+) Chipped   Pulmonary           Cardiovascular hypertension, Pt. on medications + Valvular Problems/Murmurs      Neuro/Psych  Headaches, PSYCHIATRIC DISORDERS Bipolar Disorder    GI/Hepatic hiatal hernia, GERD  Controlled,  Endo/Other    Renal/GU ESRFRenal disease     Musculoskeletal   Abdominal   Peds  Hematology  (+) anemia ,   Anesthesia Other Findings Dialysis.  Reproductive/Obstetrics                            Anesthesia Physical Anesthesia Plan  ASA: III  Anesthesia Plan: General   Post-op Pain Management:    Induction: Intravenous  PONV Risk Score and Plan:   Airway Management Planned:   Additional Equipment:   Intra-op Plan:   Post-operative Plan:   Informed Consent: I have reviewed the patients History and Physical, chart, labs and discussed the procedure including the risks, benefits and alternatives for the proposed anesthesia with the patient or authorized representative who has indicated his/her understanding and acceptance.     Plan Discussed with: CRNA  Anesthesia Plan Comments:         Anesthesia Quick Evaluation

## 2017-10-06 MED ORDER — NEPRO/CARBSTEADY PO LIQD
237.0000 mL | Freq: Two times a day (BID) | ORAL | Status: DC
  Administered 2017-10-06 – 2017-10-20 (×18): 237 mL via ORAL

## 2017-10-06 MED ORDER — EPOETIN ALFA 4000 UNIT/ML IJ SOLN
4000.0000 [IU] | Freq: Once | INTRAMUSCULAR | Status: DC
  Filled 2017-10-06: qty 1

## 2017-10-06 MED ORDER — PENTAFLUOROPROP-TETRAFLUOROETH EX AERO
INHALATION_SPRAY | CUTANEOUS | Status: DC | PRN
Start: 2017-10-06 — End: 2017-10-22
  Filled 2017-10-06: qty 103.5

## 2017-10-06 NOTE — Progress Notes (Signed)
Condition unchanged, sitter at bedside, 15 minutes checks maintained.

## 2017-10-06 NOTE — Progress Notes (Signed)
Patient ID: Joyce [name], female   DOB: December 28, 1945, 72 y.o.   MRN: [id number]  Received a phone call from Hassell, the sister and HCPOA, who is worried about dialysis. Spoke with Drs. Clapacs and Singh. Patient has been moving her arm making it impossible to gain access yesterday and today.   Informed the sister that tomorrow following ECT, the patient will be premedicated to receive dialysis. She is in agreement with the plan.

## 2017-10-06 NOTE — Progress Notes (Signed)
Transported to dialysis.

## 2017-10-06 NOTE — Progress Notes (Signed)
[facility]  ROUNDING NOTE   Subjective:   Patient seen in dialysis suite Allowed cannulation of one needle Moved her arm for the cannulation of 2nd needle causing some infiltation Denies acute c/o States that dialysis will make her hurt all over her body therefore she is apprehensive States she "cannot carry on like this"   Objective:  Vital signs in last 24 hours:  Temp:  [97.9 F (36.6 C)-99.1 F (37.3 C)] 97.9 F (36.6 C) (03/18 1553) Pulse Rate:  [64-108] 70 (03/18 1553) Resp:  [14-24] 20 (03/18 1553) BP: (92-159)/(47-77) 125/47 (03/18 1553) SpO2:  [95 %-99 %] 95 % (03/18 1553)  Weight change:  Filed Weights    Intake/Output: I/O last 3 completed shifts: In: 150 [P.O.:120; I.V.:30] Out: -    Intake/Output this shift:  No intake/output data recorded.  Physical Exam: General: NAD, Sitting up in chair  Head: Normocephalic, atraumatic. dry oral mucosal membranes  Eyes: Anicteric  Neck: Supple, trachea midline  Lungs:  Clear to auscultation  Heart: Regular rate and rhythm  Abdomen:  Soft, nontender  Extremities: no peripheral edema.  Psych:  Flat affect. Slow to respond  Neurologic: Nonfocal, moving all four extremities  Skin: No lesions  Access: Left AVF, good bruit    Basic Metabolic Panel: Recent Labs  Lab 10/01/17 0711 10/02/17 1559 10/05/17 0941 10/05/17 1941  NA 141 143 141 134*  K 4.0 4.1 4.1 3.9  CL 99* 101  --  96*  CO2 23 22  --  22  GLUCOSE 117* 113* 108* 105*  BUN 51* 76*  --  86*  CREATININE 7.39* 9.74*  --  11.38*  CALCIUM 10.3 9.7  --  8.9  PHOS 4.0 4.7*  --  4.8*    Liver Function Tests: Recent Labs  Lab 10/01/17 0711 10/02/17 1559 10/05/17 1941  ALBUMIN 4.2 4.0 3.5   No results for input(s): LIPASE, AMYLASE in the last 168 hours. Recent Labs  Lab 10/01/17 0711  AMMONIA 16    CBC: Recent Labs  Lab 10/01/17 0711 10/02/17 1559 10/05/17 0941 10/05/17 1941  WBC 10.7 11.5*  --  11.3*  HGB 13.1 12.1  10.5* 10.8*  HCT 39.0 37.1 31.0* 31.7*  MCV 95.4 96.9  --  94.8  PLT 367 385  --  364    Cardiac Enzymes: No results for input(s): CKTOTAL, CKMB, CKMBINDEX, TROPONINI in the last 168 hours.  BNP: Invalid input(s): POCBNP  CBG: Recent Labs  Lab 10/02/17 0610 10/02/17 2122 10/05/17 0702  GLUCAP 104* 103* 104*    Microbiology: Results for orders placed or performed during the hospital encounter of 09/01/17  Urine culture     Status: Abnormal   Collection Time: 09/01/17  4:32 PM  Result Value Ref Range Status   Specimen Description   Final    URINE, RANDOM Performed at [facility], [address] [place], [place] [address]    Special Requests   Final    NONE Performed at [facility], [address] [place],  [address]    Culture MULTIPLE SPECIES PRESENT, SUGGEST RECOLLECTION (A)  Final   Report Status 09/03/2017 FINAL  Final  Culture, blood (routine x 2)     Status: None   Collection Time: 09/01/17  6:05 PM  Result Value Ref Range Status   Specimen Description BLOOD RIGHT HAND  Final   Special Requests   Final    BOTTLES DRAWN AEROBIC AND ANAEROBIC Blood Culture results may not be optimal due  to an excessive volume of blood received in culture bottles   Culture   Final    NO GROWTH 5 DAYS Performed at [facility], [address] [place], [place] [address]    Report Status 09/06/2017 FINAL  Final  Culture, blood (routine x 2)     Status: None   Collection Time: 09/01/17  6:34 PM  Result Value Ref Range Status   Specimen Description BLOOD RAC  Final   Special Requests   Final    BOTTLES DRAWN AEROBIC AND ANAEROBIC Blood Culture adequate volume   Culture   Final    NO GROWTH 5 DAYS Performed at [facility], [address] [place], [place] [address]    Report Status 09/06/2017 FINAL  Final    Coagulation Studies: No results for input(s): LABPROT, INR in the last 72 hours.  Urinalysis: No results for  input(s): COLORURINE, LABSPEC, PHURINE, GLUCOSEU, HGBUR, BILIRUBINUR, KETONESUR, PROTEINUR, UROBILINOGEN, NITRITE, LEUKOCYTESUR in the last 72 hours.  Invalid input(s): APPERANCEUR    Imaging: No results found.   Medications:    . brimonidine  1 drop Both Eyes BID  . [START ON 10/17/2017] cyanocobalamin  1,000 mcg Intramuscular Q30 days  . dicyclomine  20 mg Oral TID AC  . lidocaine-prilocaine  1 application Topical Q M,W,F  . metoprolol tartrate  25 mg Oral BID  . midodrine  10 mg Oral TID WC  . multivitamin  1 tablet Oral QHS  . OLANZapine zydis  5 mg Oral TID  . pantoprazole  40 mg Oral Daily  . timolol  1 drop Both Eyes BID  . vitamin C  500 mg Oral BID   acetaminophen, alum & mag hydroxide-simeth, bisacodyl, haloperidol **OR** haloperidol lactate, magnesium hydroxide, pentafluoroprop-tetrafluoroeth  Assessment/ Plan:  [name] is a 72 y.o. white female with end stage renal disease on hemodialysis, diverticulosis, hypotension, bipolar disorder, polycystic kidney disease   [facility]/MWF  1.  ESRD on HD MWF.  Seen and examined prior to beginning hemodialysis.   Continue MWF schedule.  Attempt HD today. Patient did not allow cannulation of 2nd needle. She jerks her arm away causing slight bleeding Will cancel HD treatment today Monitor potassium in AM May try again in 1-2 days once patient is more amenable to getting HD   2.  Anemia of CKD: hemoglobin  10.8 -We will resume Epogen with hemodialysis treatments  3.  Secondary hyperparathyroidism:  Not currently on binders.   4. Hypotension:  continue midodrine prior to dialysis.   5. Delirium, bipolar affective disorder, major depression Psychiatry evaluation ongoing Currently patient is undergoing ECT treatments   LOS: [place] 3/19/20199:19 AM

## 2017-10-06 NOTE — [facility] Group Notes (Signed)
  LCSW Group Therapy Note   10/05/2017 1:00pm   Type of Therapy and Topic:  Group Therapy:  Overcoming Obstacles   Participation Level:  Did Not Attend   Description of Group:    In this group patients will be encouraged to explore what they see as obstacles to their own wellness and recovery. They will be guided to discuss their thoughts, feelings, and behaviors related to these obstacles. The group will process together ways to cope with barriers, with attention given to specific choices patients can make. Each patient will be challenged to identify changes they are motivated to make in order to overcome their obstacles. This group will be process-oriented, with patients participating in exploration of their own experiences as well as giving and receiving support and challenge from other group members.   Therapeutic Goals: 1. Patient will identify personal and current obstacles as they relate to admission. 2. Patient will identify barriers that currently interfere with their wellness or overcoming obstacles.  3. Patient will identify feelings, thought process and behaviors related to these barriers. 4. Patient will identify two changes they are willing to make to overcome these obstacles:      Summary of Patient Progress      Therapeutic Modalities:   Cognitive Behavioral Therapy Solution Focused Therapy Motivational Interviewing Relapse Prevention Therapy  August Saucer, LCSW 10/06/2017 11:03 AM

## 2017-10-06 NOTE — Progress Notes (Signed)
Patient continues to be on 1:1 no distress noted.  

## 2017-10-06 NOTE — Progress Notes (Signed)
Patient  Is alert and oriented to person and time with periods of confusion to place and situation,. Patient continues to be placed on 1;1, sitter at bedside, no distress noted, thoughts are disorganized, and sometimes illogical, affect is flat and sad, but brightens upon approach, patient's appetite improved this evening as she consumed a granula bar and drank a 240 cc of ensure. Patient is S/P dialysis no distress noted, and  currently denies SI/HI/AVH. 15 minutes safety checks maintained, will continue to monitor.

## 2017-10-06 NOTE — Progress Notes (Signed)
Condition unchanged, sitter at bedside.

## 2017-10-06 NOTE — Progress Notes (Signed)
Patient noted lying in bed with eyes closed, no distress noted.

## 2017-10-06 NOTE — B[facility] Group Notes (Signed)
  10/06/2017  Time: 0900  Type of Therapy and Topic: Group Therapy: Goals Group: SMART Goals   Participation Level:  Did Not Attend   Description of Group:   The purpose of a daily goals group is to assist and guide patients in setting recovery/wellness-related goals. The objective is to set goals as they relate to the crisis in which they were admitted. Patients will be using SMART goal modalities to set measurable goals. Characteristics of realistic goals will be discussed and patients will be assisted in setting and processing how one will reach their goal. Facilitator will also assist patients in applying interventions and coping skills learned in psycho-education groups to the SMART goal and process how one will achieve defined goal.   Therapeutic Goals:  -Patients will develop and document one goal related to or their crisis in which brought them into treatment.  -Patients will be guided by LCSW using SMART goal setting modality in how to set a measurable, attainable, realistic and time sensitive goal.  -Patients will process barriers in reaching goal.  -Patients will process interventions in how to overcome and successful in reaching goal.   Patient's Goal:  Pt was invited to attend group but chose not to attend. CSW will continue to encourage pt to attend group throughout their admission.   Therapeutic Modalities:  Motivational Interviewing  Cognitive Behavioral Therapy  Crisis Intervention Model  SMART goals setting   Alden Hipp, MSW, LCSW 10/06/2017 9:37 AM

## 2017-10-06 NOTE — Progress Notes (Signed)
Patient on 1:1 no distress noted, sitter at bedside.

## 2017-10-06 NOTE — Progress Notes (Signed)
Patient in bed with eyes closed sitter at bedside, no distress noted.

## 2017-10-06 NOTE — Progress Notes (Signed)
Condition unchanged, sitter at bedside no distress noted.  

## 2017-10-06 NOTE — Plan of Care (Signed)
  Progressing Pain Managment: General experience of comfort will improve 10/06/2017 1046 - Progressing by Rise Mu, RN Education: Will be free of psychotic symptoms 10/06/2017 1046 - Progressing by Rise Mu, RN Note Mild confusion.  No AVH.   Knowledge of the prescribed therapeutic regimen will improve 10/06/2017 1046 - Progressing by Rise Mu, RN Coping: Ability to cope will improve 10/06/2017 1046 - Progressing by Rise Mu, RN Ability to verbalize feelings will improve 10/06/2017 1046 - Progressing by Rise Mu, RN Note Able to verbalize how she is feelings. Denies any paint or discomfort.  Verbalizes that she does not want to do dialysis and that she just wants to go home.   Health Behavior/Discharge Planning: Compliance with prescribed medication regimen will improve 10/06/2017 1046 - Progressing by Rise Mu, RN Nutritional: Ability to achieve adequate nutritional intake will improve 10/06/2017 1046 - Progressing by Rise Mu, RN Note Intake has improved. Requesting water and starting to drink ensure.   Safety: Ability to redirect hostility and anger into socially appropriate behaviors will improve 10/06/2017 1046 - Progressing by Rise Mu, RN Ability to remain free from injury will improve 10/06/2017 1046 - Progressing by Rise Mu, RN Note Remains safe on the unit. 15 minute rounding maintained and safety sitter at bedside.     Not Progressing Spiritual Needs Ability to function at adequate level 10/06/2017 1046 - Not Progressing by Rise Mu, RN Note Generalized weakness.  Only wants to stay in bed.

## 2017-10-06 NOTE — Progress Notes (Signed)
Patient alert, appears restless, refused tylenol offered for discomfort / restlessness,  VS WNL  No acute distress noted, sitter at bedside.

## 2017-10-06 NOTE — [facility] Group Notes (Signed)
[facility] Group Notes:  (Nursing/MHT/Case Management/Adjunct)  Date:  10/06/2017  Time:  4:23 PM  Type of Therapy:  Psychoeducational Skills  Participation Level:  Did Not Attend    Drake Leach 10/06/2017, 4:23 PM

## 2017-10-06 NOTE — Progress Notes (Signed)
Patient continues to be on I:1, no distress noted, lying in bed with eyes closed , resting even non labored will continue to monitor.

## 2017-10-06 NOTE — Progress Notes (Signed)
[facility] MD Progress Note  10/06/2017 7:03 PM Lavonne Kinderman  MRN:  [id number] Subjective: Follow-up for 72 year old woman with bipolar disorder.  Patient seen this evening.  She is awake and responsive and able to have some conversation although she speaks only a few words.  Makes only brief eye contact.  Has eaten a little bit but does not do much on her own.  Patient tells me "I want to go home".  Does not have any insight into what is keeping her in the hospital.  Patient has not had dialysis probably in 5 or 6 days because of her inability to cooperate with dialysis treatment.  She does look like she is showing some improvement from ECT however. Principal Problem: Bipolar affective disorder, current episode manic with psychotic symptoms ([place]) Diagnosis:   Patient Active Problem List   Diagnosis Date Noted  . Palliative care encounter [Z51.5]   . Bipolar affective disorder, current episode depressed with psychotic symptoms ([place]) [F31.2] 09/17/2017  . Bipolar I disorder, most recent episode (or current) manic ([place]) [F31.10] 09/02/2017  . Acute delirium [R41.0] 09/02/2017  . Altered mental status [R41.82]   . Acute encephalopathy [G93.40] 09/01/2017  . ESRD on dialysis ([place]) [N18.6, Z99.2] 04/06/2017  . Complication of vascular access for dialysis [T82.9XXA] 04/06/2017  . Hematuria [R31.9] 12/18/2016  . Polycystic kidney [Q61.3] 10/12/2016  . Ruptured cyst of kidney [Q61.00] 10/03/2016  . HTN (hypertension) [I10] 10/01/2016  . Chronic kidney disease [N18.9] 10/01/2016  . Anemia associated with chronic renal failure [D63.1] 09/24/2016  . Acute kidney insufficiency [N28.9] 07/28/2016  . Noninfectious diarrhea [K52.9]   . Benign neoplasm of cecum [D12.0]   . Benign neoplasm of ascending colon [D12.2]   . Diarrhea [R19.7]   . Nausea [R11.0]   . Gastric polyp [K31.7]   . Chronic constipation [K59.09] 12/27/2014  . Nausea with vomiting [R11.2] 12/27/2014  . Left sided abdominal pain [R10.9]  12/27/2014  . Personal history of colonic polyps [Z86.010] 05/10/2013   Total Time spent with patient: 30 minutes  Past Psychiatric History: History of bipolar disorder but has been much worse since starting dialysis  Past Medical History:  Past Medical History:  Diagnosis Date  . Anal fissure   . Bipolar affective disorder ([place])   . CKD (chronic kidney disease)    Dr Holley Raring [name] 4  . Colon polyps   . Diverticulitis   . Diverticulitis   . Family history of adverse reaction to anesthesia    mom - PONV  . GERD (gastroesophageal reflux disease)   . Headache    migraines - none over 10 yrs  . Heart murmur   . History of hiatal hernia   . Hypertension   . Pancreatitis    Valproic acid  . Vertigo     Past Surgical History:  Procedure Laterality Date  . A/V FISTULAGRAM Left 04/14/2017   Procedure: A/V Fistulagram;  Surgeon: Katha Cabal, MD;  Location: [place] [facility];  Service: Cardiovascular;  Laterality: Left;  . A/V FISTULAGRAM Left 06/09/2017   Procedure: A/V FISTULAGRAM;  Surgeon: Katha Cabal, MD;  Location: [place] [facility];  Service: Cardiovascular;  Laterality: Left;  . ABDOMINAL HYSTERECTOMY  1990 ?  . AV FISTULA PLACEMENT  4/30  . BREAST EXCISIONAL BIOPSY Left 1994   neg surgical bx  . CHOLECYSTECTOMY  2003  . COLONOSCOPY  2014   Dr. Jamal Collin  . COLONOSCOPY WITH PROPOFOL N/A 09/24/2015   Procedure: COLONOSCOPY WITH random colon byopies.;  Surgeon: Lucilla Lame, MD;  Location: [place];  Service: Endoscopy;  Laterality: N/A;  . ESOPHAGOGASTRODUODENOSCOPY (EGD) WITH PROPOFOL N/A 09/24/2015   Procedure: ESOPHAGOGASTRODUODENOSCOPY (EGD) ;  Surgeon: Lucilla Lame, MD;  Location: [place];  Service: Endoscopy;  Laterality: N/A;  . EYE SURGERY    . PERIPHERAL VASCULAR CATHETERIZATION N/A 05/29/2015   Procedure: A/V Shuntogram/Fistulagram;  Surgeon: Katha Cabal, MD;  Location: [place] [facility];  Service: Cardiovascular;   Laterality: N/A;  . PERIPHERAL VASCULAR CATHETERIZATION N/A 05/29/2015   Procedure: A/V Shunt Intervention;  Surgeon: Katha Cabal, MD;  Location: [place] [facility];  Service: Cardiovascular;  Laterality: N/A;  . POLYPECTOMY  09/24/2015   Procedure: POLYPECTOMY INTESTINAL;  Surgeon: Lucilla Lame, MD;  Location: [place];  Service: Endoscopy;;  cecal polyp ascending polyp   Family History:  Family History  Problem Relation Age of Onset  . Stroke Father   . Hypertension Father   . Breast cancer Other   . Colon cancer Neg Hx   . Liver disease Neg Hx    Family Psychiatric  History: Depression Social History:  Social History   Substance and Sexual Activity  Alcohol Use No  . Alcohol/week: 0.0 oz     Social History   Substance and Sexual Activity  Drug Use No    Social History   Socioeconomic History  . Marital status: Single    Spouse name: None  . Number of children: 1  . Years of education: None  . Highest education level: None  Social Needs  . Financial resource strain: None  . Food insecurity - worry: None  . Food insecurity - inability: None  . Transportation needs - medical: None  . Transportation needs - non-medical: None  Occupational History  . Occupation: Surveyor, quantity: [employer]  Tobacco Use  . Smoking status: Never Smoker  . Smokeless tobacco: Never Used  Substance and Sexual Activity  . Alcohol use: No    Alcohol/week: 0.0 oz  . Drug use: No  . Sexual activity: Not Currently  Other Topics Concern  . 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   Additional Social History:    History of alcohol / drug use?: No history of alcohol / drug abuse                    Sleep: Fair  Appetite:  Poor  Current Medications: Current Facility-Administered Medications  Medication Dose Route Frequency Provider Last Rate Last Dose  . acetaminophen  (TYLENOL) tablet 325 mg  325 mg Oral BID PRN Clapacs, [name], MD   325 mg at 10/06/17 1238  . alum & mag hydroxide-simeth (MAALOX/MYLANTA) 200-200-20 MG/5ML suspension 30 mL  30 mL Oral Q4H PRN Clapacs, [name], MD      . bisacodyl (DULCOLAX) EC tablet 5 mg  5 mg Oral Daily PRN Clapacs, [name], MD      . brimonidine (ALPHAGAN) 0.2 % ophthalmic solution 1 drop  1 drop Both Eyes BID Clapacs, [name], MD   1 drop at 10/06/17 1741  . [START ON 10/17/2017] cyanocobalamin ((VITAMIN B-12)) injection 1,000 mcg  1,000 mcg Intramuscular Q30 days Clapacs, [name], MD      . dicyclomine (BENTYL) tablet 20 mg  20 mg Oral TID AC Clapacs, [name], MD   20 mg at 10/06/17 1741  . feeding supplement (  NEPRO CARB STEADY) liquid 237 mL  237 mL Oral BID BM Singh, [name], MD   237 mL at 10/06/17 1044  . haloperidol (HALDOL) tablet 5 mg  5 mg Oral Q8H PRN [name], [name], MD   5 mg at 10/06/17 1238   Or  . haloperidol lactate (HALDOL) injection 5 mg  5 mg Intramuscular Q8H PRN [name], [name], MD   5 mg at 10/01/17 0908  . lidocaine-prilocaine (EMLA) cream 1 application  1 application Topical Q M,W,F Clapacs, [name], MD   1 application at 84/69/62 1219  . magnesium hydroxide (MILK OF MAGNESIA) suspension 30 mL  30 mL Oral Daily PRN Clapacs, [name], MD      . metoprolol tartrate (LOPRESSOR) tablet 25 mg  25 mg Oral BID Gladstone Lighter, MD   25 mg at 10/06/17 1740  . midodrine (PROAMATINE) tablet 10 mg  10 mg Oral TID WC [name], MD   10 mg at 10/06/17 1740  . multivitamin (RENA-VIT) tablet 1 tablet  1 tablet Oral QHS [name], [name], MD   1 tablet at 10/05/17 2150  . OLANZapine zydis (ZYPREXA) disintegrating tablet 5 mg  5 mg Oral TID [name], [name], MD   5 mg at 10/06/17 1748  . pantoprazole (PROTONIX) EC tablet 40 mg  40 mg Oral Daily Clapacs, [name], MD   40 mg at 10/06/17 1246  . pentafluoroprop-tetrafluoroeth (GEBAUERS) aerosol   Topical PRN [name], MD      . timolol (TIMOPTIC)  0.5 % ophthalmic solution 1 drop  1 drop Both Eyes BID Clapacs, [name], MD   1 drop at 10/06/17 1741  . vitamin C (ASCORBIC ACID) tablet 500 mg  500 mg Oral BID [name], [name], MD   500 mg at 10/06/17 1740    Lab Results:  Results for orders placed or performed during the hospital encounter of 09/17/17 (from the past 48 hour(s))  Glucose, capillary     Status: Abnormal   Collection Time: 10/05/17  7:02 AM  Result Value Ref Range   Glucose-Capillary 104 (H) 65 - 99 mg/dL   Comment 1 Notify RN   I-STAT 4, (NA,K, GLUC, HGB,HCT)     Status: Abnormal   Collection Time: 10/05/17  9:41 AM  Result Value Ref Range   Sodium 141 135 - 145 mmol/L   Potassium 4.1 3.5 - 5.1 mmol/L   Glucose, Bld 108 (H) 65 - 99 mg/dL   HCT 31.0 (L) 36.0 - 46.0 %   Hemoglobin 10.5 (L) 12.0 - 15.0 g/dL  Renal function panel     Status: Abnormal   Collection Time: 10/05/17  7:41 PM  Result Value Ref Range   Sodium 134 (L) 135 - 145 mmol/L   Potassium 3.9 3.5 - 5.1 mmol/L   Chloride 96 (L) 101 - 111 mmol/L   CO2 22 22 - 32 mmol/L   Glucose, Bld 105 (H) 65 - 99 mg/dL   BUN 86 (H) 6 - 20 mg/dL   Creatinine, Ser 11.38 (H) 0.44 - 1.00 mg/dL   Calcium 8.9 8.9 - 10.3 mg/dL   Phosphorus 4.8 (H) 2.5 - 4.6 mg/dL   Albumin 3.5 3.5 - 5.0 g/dL   GFR calc non Af Amer 3 (L) >60 mL/min   GFR calc Af Amer 3 (L) >60 mL/min    Comment: (NOTE) The eGFR has been calculated using the CKD EPI equation. This calculation has not been validated in all clinical situations. eGFR's persistently <60 mL/min  signify possible Chronic Kidney Disease.    Anion gap 16 (H) 5 - 15    Comment: Performed at [facility], [address] [place], [place] [address]  CBC     Status: Abnormal   Collection Time: 10/05/17  7:41 PM  Result Value Ref Range   WBC 11.3 (H) 3.6 - 11.0 K/uL   RBC 3.35 (L) 3.80 - 5.20 MIL/uL   Hemoglobin 10.8 (L) 12.0 - 16.0 g/dL   HCT 31.7 (L) 35.0 - 47.0 %   MCV 94.8 80.0 - 100.0 fL   MCH 32.3 26.0 -  34.0 pg   MCHC 34.1 32.0 - 36.0 g/dL   RDW 14.1 11.5 - 14.5 %   Platelets 364 150 - 440 K/uL    Comment: Performed at [facility], [address] [place], [place] [address]    Blood Alcohol level:  Lab Results  Component Value Date   [facility] <10 94/76/5465    Metabolic Disorder Labs: Lab Results  Component Value Date   HGBA1C 4.8 09/18/2017   MPG 91.06 09/18/2017   No results found for: PROLACTIN Lab Results  Component Value Date   CHOL 251 (H) 09/18/2017   TRIG 124 09/18/2017   HDL 66 09/18/2017   CHOLHDL 3.8 09/18/2017   VLDL 25 09/18/2017   LDLCALC 160 (H) 09/18/2017   LDLCALC 43 05/14/2014    Physical Findings: AIMS: Facial and Oral Movements Muscles of Facial Expression: None, normal Lips and Perioral Area: None, normal Jaw: None, normal Tongue: None, normal,Extremity Movements Upper (arms, wrists, hands, fingers): None, normal Lower (legs, knees, ankles, toes): None, normal, Trunk Movements Neck, shoulders, hips: None, normal, Overall Severity Severity of abnormal movements (highest score from questions above): None, normal Incapacitation due to abnormal movements: None, normal Patient's awareness of abnormal movements (rate only patient's report): No Awareness, Dental Status Current problems with teeth and/or dentures?: No Does patient usually wear dentures?: No  CIWA:    COWS:     Musculoskeletal: Strength & Muscle Tone: atrophy Gait & Station: unable to stand Patient leans: N/A  Psychiatric Specialty Exam: Physical Exam  Nursing note and vitals reviewed. Constitutional: She appears well-developed and well-nourished.  HENT:  Head: Normocephalic and atraumatic.  Eyes: Conjunctivae are normal. Pupils are equal, round, and reactive to light.  Neck: Normal range of motion.  Cardiovascular: Regular rhythm and normal heart sounds.  Respiratory: Effort normal. No respiratory distress.  GI: Soft.  Musculoskeletal: Normal range of motion.   Neurological: She is alert.  Skin: Skin is warm and dry.  Psychiatric: Her affect is blunt. Her speech is delayed. She is slowed. Thought content is delusional. Cognition and memory are impaired. She expresses impulsivity and inappropriate judgment. She expresses no suicidal ideation.    Review of Systems  Constitutional: Negative.   HENT: Negative.   Eyes: Negative.   Respiratory: Negative.   Cardiovascular: Negative.   Gastrointestinal: Negative.   Musculoskeletal: Negative.   Skin: Negative.   Neurological: Negative.   Psychiatric/Behavioral: Positive for depression and memory loss. Negative for hallucinations, substance abuse and suicidal ideas. The patient is nervous/anxious. The patient does not have insomnia.     Blood pressure 112/89, pulse 61, temperature 97.8 F (36.6 C), temperature source Oral, resp. rate 18, height '5\' 3"'$  (1.6 m), weight 72.6 kg (160 lb), SpO2 98 %.Body mass index is 28.34 kg/m.  General Appearance: Disheveled  Eye Contact:  Minimal  Speech:  Slow and Slurred  Volume:  Decreased  Mood:  Depressed  Affect:  Constricted  Thought Process:  Disorganized  Orientation:  Negative  Thought Content:  Illogical  Suicidal Thoughts:  No  Homicidal Thoughts:  No  Memory:  Immediate;   Fair Recent;   Poor Remote;   Poor  Judgement:  Poor  Insight:  Shallow  Psychomotor Activity:  Decreased  Concentration:  Concentration: Poor  Recall:  Poor  Fund of Knowledge:  Poor  Language:  Poor  Akathisia:  No  Handed:  Right  AIMS (if indicated):     Assets:  Housing Social Support  ADL's:  Impaired  Cognition:  Impaired,  Moderate  Sleep:  Number of Hours: 4.45     Treatment Plan Summary: Daily contact with patient to assess and evaluate symptoms and progress in treatment, Medication management and Plan Patient is feeling sick and run down.  As I explained to her at least part of this is undoubtedly because she has not gotten dialyzed.  I made it as clear as  possible that she is going to die if she will not cooperate with dialysis.  We had a conversation this afternoon with Dr. Candiss Norse from nephrology.  The plan will be that after ECT tomorrow we will give the patient a significant dose of sedating medication to try and get her through dialysis.  Continue ECT at least through this week along with antipsychotic and mood stabilizing medicine.  Alethia Berthold, MD 10/06/2017, 7:03 PM

## 2017-10-06 NOTE — [facility] Group Notes (Signed)
10/06/2017 1PM  Type of Therapy/Topic:  Group Therapy:  Feelings about Diagnosis  Participation Level:  Did Not Attend   Description of Group:   This group will allow patients to explore their thoughts and feelings about diagnoses they have received. Patients will be guided to explore their level of understanding and acceptance of these diagnoses. Facilitator will encourage patients to process their thoughts and feelings about the reactions of others to their diagnosis and will guide patients in identifying ways to discuss their diagnosis with significant others in their lives. This group will be process-oriented, with patients participating in exploration of their own experiences, giving and receiving support, and processing challenge from other group members.   Therapeutic Goals: 1. Patient will demonstrate understanding of diagnosis as evidenced by identifying two or more symptoms of the disorder 2. Patient will be able to express two feelings regarding the diagnosis 3. Patient will demonstrate their ability to communicate their needs through discussion and/or role play  Summary of Patient Progress: Patient was encouraged and invited to attend group. Patient did not attend group. Social worker will continue to encourage group participation in the future.        Therapeutic Modalities:   Cognitive Behavioral Therapy Brief Therapy Feelings Identification    Darin Engels, [place] 10/06/2017 2:08 PM

## 2017-10-06 NOTE — Progress Notes (Signed)
Condition unchanged , awake in bed , resting , sitter at bedside no distress noted .

## 2017-10-06 NOTE — [facility] Group Notes (Signed)
[place] Group Notes:  (Nursing/MHT/Case Management/Adjunct)  Date:  10/06/2017  Time:  11:17 PM  Type of Therapy:  Group Therapy  Participation Level:  Did Not Attend  Summary of Progress/Problems:  Joyce [name] 10/06/2017, 11:17 PM

## 2017-10-06 NOTE — Progress Notes (Signed)
Hourly Rounding 0800 Resting in bed with eyes closed.  1:1 sitter at bedside.   0900 In dialysis.  1:1 continued.   1000 Resting in bed with eyes closed.  1:1 continued.   1100 Resting in bed.  1:1 continued 1200 Restless. Can't be still in bed.  1:1 continured 1300 Resting in bed with eyes closed.  Sitter maintained.   1400 Resting in bed with eyes closed.  Sitter maintained.   1500 Resting in bed with eyes closed.  Sitter maintained.   1600 Resting in bed with eyes closed.  Sitter maintained.   1700 Laying in bed eating cookie.  Sitter maintained  1800 Resting in bed.   Sitter maintained. 1900 Resting in bed.   Sitter maintained.

## 2017-10-07 ENCOUNTER — Inpatient Hospital Stay: Payer: Medicare HMO | Admitting: Anesthesiology

## 2017-10-07 ENCOUNTER — Other Ambulatory Visit: Payer: Self-pay | Admitting: Psychiatry

## 2017-10-07 DIAGNOSIS — I120 Hypertensive chronic kidney disease with stage 5 chronic kidney disease or end stage renal disease: Secondary | ICD-10-CM | POA: Diagnosis not present

## 2017-10-07 DIAGNOSIS — N186 End stage renal disease: Secondary | ICD-10-CM | POA: Diagnosis not present

## 2017-10-07 DIAGNOSIS — F315 Bipolar disorder, current episode depressed, severe, with psychotic features: Secondary | ICD-10-CM | POA: Diagnosis not present

## 2017-10-07 DIAGNOSIS — K219 Gastro-esophageal reflux disease without esophagitis: Secondary | ICD-10-CM | POA: Diagnosis not present

## 2017-10-07 LAB — CARBAMAZEPINE, FREE AND TOTAL
CARBAMAZEPINE FREE: 0.7 ug/mL (ref 0.6–4.2)
CARBAMAZEPINE, TOTAL: 2.8 ug/mL — AB (ref 4.0–12.0)

## 2017-10-07 LAB — GLUCOSE, CAPILLARY: Glucose-Capillary: 94 mg/dL (ref 65–99)

## 2017-10-07 LAB — POTASSIUM: POTASSIUM: 4.2 mmol/L (ref 3.5–5.1)

## 2017-10-07 MED ORDER — ZIPRASIDONE MESYLATE 20 MG IM SOLR
10.0000 mg | Freq: Once | INTRAMUSCULAR | Status: AC
  Administered 2017-10-07: 10 mg via INTRAMUSCULAR
  Filled 2017-10-07: qty 20

## 2017-10-07 MED ORDER — ZIPRASIDONE MESYLATE 20 MG IM SOLR
10.0000 mg | INTRAMUSCULAR | Status: AC
  Administered 2017-10-07: 10 mg via INTRAMUSCULAR
  Filled 2017-10-07: qty 20

## 2017-10-07 MED ORDER — SUCCINYLCHOLINE CHLORIDE 200 MG/10ML IV SOSY
PREFILLED_SYRINGE | INTRAVENOUS | Status: DC | PRN
  Administered 2017-10-07: 80 mg via INTRAVENOUS

## 2017-10-07 MED ORDER — LORAZEPAM 2 MG/ML IJ SOLN: 2.0000 mg | INTRAMUSCULAR | Status: AC

## 2017-10-07 MED ORDER — EPOETIN ALFA 4000 UNIT/ML IJ SOLN
4000.0000 [IU] | INTRAMUSCULAR | Status: DC
  Administered 2017-10-09 – 2017-10-15 (×3): 4000 [IU] via INTRAVENOUS
  Filled 2017-10-07 (×6): qty 1

## 2017-10-07 MED ORDER — LORAZEPAM 2 MG/ML IJ SOLN
INTRAMUSCULAR | Status: AC
  Administered 2017-10-07: 2 mg
  Filled 2017-10-07: qty 1

## 2017-10-07 MED ORDER — SODIUM CHLORIDE 0.9 % IV SOLN
INTRAVENOUS | Status: DC | PRN
  Administered 2017-10-07: 10:00:00 via INTRAVENOUS

## 2017-10-07 MED ORDER — LORAZEPAM 2 MG/ML IJ SOLN
INTRAMUSCULAR | Status: AC
  Administered 2017-10-07: 2 mg via INTRAVENOUS
  Filled 2017-10-07: qty 1

## 2017-10-07 MED ORDER — SUCCINYLCHOLINE CHLORIDE 20 MG/ML IJ SOLN
INTRAMUSCULAR | Status: AC
  Filled 2017-10-07: qty 1

## 2017-10-07 MED ORDER — SODIUM CHLORIDE 0.9 % IV SOLN
500.0000 mL | Freq: Once | INTRAVENOUS | Status: AC
  Administered 2017-10-07: 500 mL via INTRAVENOUS

## 2017-10-07 MED ORDER — LORAZEPAM 2 MG/ML IJ SOLN
2.0000 mg | INTRAMUSCULAR | Status: DC | PRN
  Administered 2017-10-14: 2 mg via INTRAMUSCULAR
  Filled 2017-10-07: qty 1

## 2017-10-07 MED ORDER — LORAZEPAM 2 MG/ML IJ SOLN
2.0000 mg | Freq: Once | INTRAMUSCULAR | Status: AC
  Administered 2017-10-07: 2 mg via INTRAVENOUS

## 2017-10-07 MED ORDER — METHOHEXITAL SODIUM 100 MG/10ML IV SOSY
PREFILLED_SYRINGE | INTRAVENOUS | Status: DC | PRN
  Administered 2017-10-07: 70 mg via INTRAVENOUS

## 2017-10-07 NOTE — Progress Notes (Signed)
Patient is alert and oriented no distress noted, sitter at bedside.

## 2017-10-07 NOTE — Plan of Care (Signed)
  Pain Managment: General experience of comfort will improve 10/07/2017 0513 - Progressing by Harl Bowie, RN   Coping: Ability to cope will improve 10/07/2017 0513 - Progressing by Harl Bowie, RN   Nutritional: Ability to achieve adequate nutritional intake will improve 10/07/2017 0513 - Progressing by Harl Bowie, RN

## 2017-10-07 NOTE — [facility] Group Notes (Signed)
  10/07/2017  Time: 1PM  Type of Therapy/Topic:  Group Therapy:  Emotion Regulation  Participation Level:  Did Not Attend   Description of Group:    The purpose of this group is to assist patients in learning to regulate negative emotions and experience positive emotions. Patients will be guided to discuss ways in which they have been vulnerable to their negative emotions. These vulnerabilities will be juxtaposed with experiences of positive emotions or situations, and patients will be challenged to use positive emotions to combat negative ones. Special emphasis will be placed on coping with negative emotions in conflict situations, and patients will process healthy conflict resolution skills.  Therapeutic Goals: 1. Patient will identify two positive emotions or experiences to reflect on in order to balance out negative emotions 2. Patient will label two or more emotions that they find the most difficult to experience 3. Patient will demonstrate positive conflict resolution skills through discussion and/or role plays  Summary of Patient Progress: Pt was invited to attend group but chose not to attend. CSW will continue to encourage pt to attend group throughout their admission.   Therapeutic Modalities:   Cognitive Behavioral Therapy Feelings Identification Dialectical Behavioral Therapy  Alden Hipp, MSW, LCSW 10/07/2017 2:06 PM

## 2017-10-07 NOTE — Progress Notes (Signed)
Patient continues to be on 1:1 no distress noted, sitter at bedside.  

## 2017-10-07 NOTE — Progress Notes (Signed)
Patient returned to unit after having ECT procedure done. Patient alert and oriented to self, place and time. Ate a small percentage of meal and approx. 120 cc of fluid without difficulty, after meal pt observed resting in bed with eyes closed. Will continue to monitor.

## 2017-10-07 NOTE — Progress Notes (Signed)
Patient alert sitter at bedside no distress noted, will contiue to monitor.

## 2017-10-07 NOTE — Progress Notes (Signed)
Patient asleep, no distress noted , will continue to monitor.

## 2017-10-07 NOTE — Progress Notes (Signed)
Patient snooring with eyes closed , no distress noted will continue to monitor.

## 2017-10-07 NOTE — Progress Notes (Signed)
Sitter at bedside, patient lying in bed with eyes closed no distress noted will continue to closely monitor.

## 2017-10-07 NOTE — Progress Notes (Signed)
[facility] MD Progress Note  10/07/2017 6:38 PM Joyce [name]  MRN:  [id number] Subjective: Follow up for 72 year old woman with bipolar disorder.  Very withdrawn nearly catatonic.  ECT today.  Procedure without difficulty.  Patient appears to be showing gradual improvement but it is slight.  She is able to speak a few more words and is a little more understandable.  She seemed to be able to understand a discussion about the importance of getting dialysis done today.  Patient is very sluggish and had not gotten dialysis for several days because of her behavior.  Today the plan was to give her sedating medication in order to get her through the dialysis session. Principal Problem: Bipolar affective disorder, current episode manic with psychotic symptoms ([place]) Diagnosis:   Patient Active Problem List   Diagnosis Date Noted  . Palliative care encounter [Z51.5]   . Bipolar affective disorder, current episode depressed with psychotic symptoms ([place]) [F31.2] 09/17/2017  . Bipolar I disorder, most recent episode (or current) manic ([place]) [F31.10] 09/02/2017  . Acute delirium [R41.0] 09/02/2017  . Altered mental status [R41.82]   . Acute encephalopathy [G93.40] 09/01/2017  . ESRD on dialysis ([place]) [N18.6, Z99.2] 04/06/2017  . Complication of vascular access for dialysis [T82.9XXA] 04/06/2017  . Hematuria [R31.9] 12/18/2016  . Polycystic kidney [Q61.3] 10/12/2016  . Ruptured cyst of kidney [Q61.00] 10/03/2016  . HTN (hypertension) [I10] 10/01/2016  . Chronic kidney disease [N18.9] 10/01/2016  . Anemia associated with chronic renal failure [D63.1] 09/24/2016  . Acute kidney insufficiency [N28.9] 07/28/2016  . Noninfectious diarrhea [K52.9]   . Benign neoplasm of cecum [D12.0]   . Benign neoplasm of ascending colon [D12.2]   . Diarrhea [R19.7]   . Nausea [R11.0]   . Gastric polyp [K31.7]   . Chronic constipation [K59.09] 12/27/2014  . Nausea with vomiting [R11.2] 12/27/2014  . Left sided abdominal  pain [R10.9] 12/27/2014  . Personal history of colonic polyps [Z86.010] 05/10/2013   Total Time spent with patient: 30 minutes  Past Psychiatric History: History of bipolar disorder and psychosis  Past Medical History:  Past Medical History:  Diagnosis Date  . Anal fissure   . Bipolar affective disorder ([place])   . CKD (chronic kidney disease)    Dr Holley Raring [name] 4  . Colon polyps   . Diverticulitis   . Diverticulitis   . Family history of adverse reaction to anesthesia    mom - PONV  . GERD (gastroesophageal reflux disease)   . Headache    migraines - none over 10 yrs  . Heart murmur   . History of hiatal hernia   . Hypertension   . Pancreatitis    Valproic acid  . Vertigo     Past Surgical History:  Procedure Laterality Date  . A/V FISTULAGRAM Left 04/14/2017   Procedure: A/V Fistulagram;  Surgeon: Katha Cabal, MD;  Location: [place] [facility];  Service: Cardiovascular;  Laterality: Left;  . A/V FISTULAGRAM Left 06/09/2017   Procedure: A/V FISTULAGRAM;  Surgeon: Katha Cabal, MD;  Location: [place] [facility];  Service: Cardiovascular;  Laterality: Left;  . ABDOMINAL HYSTERECTOMY  1990 ?  . AV FISTULA PLACEMENT  4/30  . BREAST EXCISIONAL BIOPSY Left 1994   neg surgical bx  . CHOLECYSTECTOMY  2003  . COLONOSCOPY  2014   Dr. Jamal Collin  . COLONOSCOPY WITH PROPOFOL N/A 09/24/2015   Procedure: COLONOSCOPY WITH random colon byopies.;  Surgeon: Lucilla Lame, MD;  Location: [place];  Service: Endoscopy;  Laterality: N/A;  . ESOPHAGOGASTRODUODENOSCOPY (EGD) WITH PROPOFOL N/A 09/24/2015   Procedure: ESOPHAGOGASTRODUODENOSCOPY (EGD) ;  Surgeon: Lucilla Lame, MD;  Location: [place];  Service: Endoscopy;  Laterality: N/A;  . EYE SURGERY    . PERIPHERAL VASCULAR CATHETERIZATION N/A 05/29/2015   Procedure: A/V Shuntogram/Fistulagram;  Surgeon: Katha Cabal, MD;  Location: [place] [facility];  Service: Cardiovascular;  Laterality: N/A;  .  PERIPHERAL VASCULAR CATHETERIZATION N/A 05/29/2015   Procedure: A/V Shunt Intervention;  Surgeon: Katha Cabal, MD;  Location: [place] [facility];  Service: Cardiovascular;  Laterality: N/A;  . POLYPECTOMY  09/24/2015   Procedure: POLYPECTOMY INTESTINAL;  Surgeon: Lucilla Lame, MD;  Location: [place];  Service: Endoscopy;;  cecal polyp ascending polyp   Family History:  Family History  Problem Relation Age of Onset  . Stroke Father   . Hypertension Father   . Breast cancer Other   . Colon cancer Neg Hx   . Liver disease Neg Hx    Family Psychiatric  History: None Social History:  Social History   Substance and Sexual Activity  Alcohol Use No  . Alcohol/week: 0.0 oz     Social History   Substance and Sexual Activity  Drug Use No    Social History   Socioeconomic History  . Marital status: Single    Spouse name: None  . Number of children: 1  . Years of education: None  . Highest education level: None  Social Needs  . Financial resource strain: None  . Food insecurity - worry: None  . Food insecurity - inability: None  . Transportation needs - medical: None  . Transportation needs - non-medical: None  Occupational History  . Occupation: Surveyor, quantity: [employer]  Tobacco Use  . Smoking status: Never Smoker  . Smokeless tobacco: Never Used  Substance and Sexual Activity  . Alcohol use: No    Alcohol/week: 0.0 oz  . Drug use: No  . Sexual activity: Not Currently  Other Topics Concern  . 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   Additional Social History:    History of alcohol / drug use?: No history of alcohol / drug abuse                    Sleep: Fair  Appetite:  Poor  Current Medications: Current Facility-Administered Medications  Medication Dose Route Frequency Provider Last Rate Last Dose  . acetaminophen (TYLENOL) tablet 325 mg  325 mg  Oral BID PRN Reyann Troop, [name], MD   325 mg at 10/06/17 1238  . alum & mag hydroxide-simeth (MAALOX/MYLANTA) 200-200-20 MG/5ML suspension 30 mL  30 mL Oral Q4H PRN Marili Vader T, MD      . bisacodyl (DULCOLAX) EC tablet 5 mg  5 mg Oral Daily PRN Jahniya Duzan T, MD      . brimonidine (ALPHAGAN) 0.2 % ophthalmic solution 1 drop  1 drop Both Eyes BID Yanky Vanderburg, [name], MD   1 drop at 10/07/17 0728  . [START ON 10/17/2017] cyanocobalamin ((VITAMIN B-12)) injection 1,000 mcg  1,000 mcg Intramuscular Q30 days Austynn Pridmore T, MD      . dicyclomine (BENTYL) tablet 20 mg  20 mg Oral TID AC Jadie Allington, [name], MD   20 mg at 10/07/17 1241  . [START ON 10/09/2017] epoetin alfa (EPOGEN,PROCRIT) injection 4,000 Units  4,000 Units Intravenous Q M,W,F-HD  [name], MD      . feeding supplement (NEPRO CARB STEADY) liquid 237 mL  237 mL Oral BID BM Singh, [name], MD   237 mL at 10/07/17 1343  . haloperidol (HALDOL) tablet 5 mg  5 mg Oral Q8H PRN [name], [name], MD   5 mg at 10/06/17 1238   Or  . haloperidol lactate (HALDOL) injection 5 mg  5 mg Intramuscular Q8H PRN [name], [name], MD   5 mg at 10/01/17 0908  . lidocaine-prilocaine (EMLA) cream 1 application  1 application Topical Q M,W,F Nobuko Gsell, [name], MD   1 application at 71/24/58 1219  . LORazepam (ATIVAN) injection 2 mg  2 mg Intramuscular STAT Lawana Hartzell T, MD      . LORazepam (ATIVAN) injection 2 mg  2 mg Intramuscular Q4H PRN Sandia Pfund T, MD      . magnesium hydroxide (MILK OF MAGNESIA) suspension 30 mL  30 mL Oral Daily PRN Nisreen Guise T, MD      . metoprolol tartrate (LOPRESSOR) tablet 25 mg  25 mg Oral BID Gladstone Lighter, MD   25 mg at 10/06/17 1740  . midodrine (PROAMATINE) tablet 10 mg  10 mg Oral TID WC [name], MD   10 mg at 10/07/17 1240  . multivitamin (RENA-VIT) tablet 1 tablet  1 tablet Oral QHS [name], [name], MD   1 tablet at 10/06/17 2244  . OLANZapine zydis (ZYPREXA) disintegrating tablet 5 mg  5  mg Oral TID [name], [name], MD   5 mg at 10/07/17 1240  . pantoprazole (PROTONIX) EC tablet 40 mg  40 mg Oral Daily Aoife Bold, [name], MD   40 mg at 10/07/17 1242  . pentafluoroprop-tetrafluoroeth (GEBAUERS) aerosol   Topical PRN [name], MD      . timolol (TIMOPTIC) 0.5 % ophthalmic solution 1 drop  1 drop Both Eyes BID Francena Zender, [name], MD   1 drop at 10/07/17 0814  . vitamin C (ASCORBIC ACID) tablet 500 mg  500 mg Oral BID [name], [name], MD   500 mg at 10/06/17 1740    Lab Results:  Results for orders placed or performed during the hospital encounter of 09/17/17 (from the past 48 hour(s))  Renal function panel     Status: Abnormal   Collection Time: 10/05/17  7:41 PM  Result Value Ref Range   Sodium 134 (L) 135 - 145 mmol/L   Potassium 3.9 3.5 - 5.1 mmol/L   Chloride 96 (L) 101 - 111 mmol/L   CO2 22 22 - 32 mmol/L   Glucose, Bld 105 (H) 65 - 99 mg/dL   BUN 86 (H) 6 - 20 mg/dL   Creatinine, Ser 11.38 (H) 0.44 - 1.00 mg/dL   Calcium 8.9 8.9 - 10.3 mg/dL   Phosphorus 4.8 (H) 2.5 - 4.6 mg/dL   Albumin 3.5 3.5 - 5.0 g/dL   GFR calc non Af Amer 3 (L) >60 mL/min   GFR calc Af Amer 3 (L) >60 mL/min    Comment: (NOTE) The eGFR has been calculated using the CKD EPI equation. This calculation has not been validated in all clinical situations. eGFR's persistently <60 mL/min signify possible Chronic Kidney Disease.    Anion gap 16 (H) 5 - 15    Comment: Performed at [facility], [address] [place], [place] [address]  CBC     Status: Abnormal   Collection Time: 10/05/17  7:41 PM  Result Value Ref Range   WBC  11.3 (H) 3.6 - 11.0 K/uL   RBC 3.35 (L) 3.80 - 5.20 MIL/uL   Hemoglobin 10.8 (L) 12.0 - 16.0 g/dL   HCT 31.7 (L) 35.0 - 47.0 %   MCV 94.8 80.0 - 100.0 fL   MCH 32.3 26.0 - 34.0 pg   MCHC 34.1 32.0 - 36.0 g/dL   RDW 14.1 11.5 - 14.5 %   Platelets 364 150 - 440 K/uL    Comment: Performed at [facility], [address] [place],  [place] [address]  Glucose, capillary     Status: None   Collection Time: 10/07/17  6:26 AM  Result Value Ref Range   Glucose-Capillary 94 65 - 99 mg/dL  Potassium     Status: None   Collection Time: 10/07/17  7:37 AM  Result Value Ref Range   Potassium 4.2 3.5 - 5.1 mmol/L    Comment: Performed at [facility], [address] [place], [place] [address]    Blood Alcohol level:  Lab Results  Component Value Date   [facility] <10 40/34/7425    Metabolic Disorder Labs: Lab Results  Component Value Date   HGBA1C 4.8 09/18/2017   MPG 91.06 09/18/2017   No results found for: PROLACTIN Lab Results  Component Value Date   CHOL 251 (H) 09/18/2017   TRIG 124 09/18/2017   HDL 66 09/18/2017   CHOLHDL 3.8 09/18/2017   VLDL 25 09/18/2017   LDLCALC 160 (H) 09/18/2017   LDLCALC 43 05/14/2014    Physical Findings: AIMS: Facial and Oral Movements Muscles of Facial Expression: None, normal Lips and Perioral Area: None, normal Jaw: None, normal Tongue: None, normal,Extremity Movements Upper (arms, wrists, hands, fingers): None, normal Lower (legs, knees, ankles, toes): None, normal, Trunk Movements Neck, shoulders, hips: None, normal, Overall Severity Severity of abnormal movements (highest score from questions above): None, normal Incapacitation due to abnormal movements: None, normal Patient's awareness of abnormal movements (rate only patient's report): No Awareness, Dental Status Current problems with teeth and/or dentures?: No Does patient usually wear dentures?: No  CIWA:    COWS:     Musculoskeletal: Strength & Muscle Tone: decreased and atrophy Gait & Station: unsteady, unable to stand Patient leans: N/A  Psychiatric Specialty Exam: Physical Exam  Nursing note and vitals reviewed. Constitutional: She appears well-developed.  HENT:  Head: Normocephalic and atraumatic.  Eyes: Conjunctivae are normal. Pupils are equal, round, and reactive to light.  Neck: Normal range of  motion.  Cardiovascular: Regular rhythm and normal heart sounds.  Respiratory: Effort normal. No respiratory distress.  GI: Soft.  Musculoskeletal: Normal range of motion.  Neurological: She is alert.  Skin: Skin is warm and dry.  Psychiatric: Her affect is blunt. Her speech is delayed. She is slowed and withdrawn. Thought content is not paranoid. Cognition and memory are impaired. She expresses impulsivity and inappropriate judgment. She expresses no suicidal ideation.    Review of Systems  Constitutional: Negative.   HENT: Negative.   Eyes: Negative.   Respiratory: Negative.   Cardiovascular: Negative.   Gastrointestinal: Negative.   Musculoskeletal: Negative.   Skin: Negative.   Neurological: Negative.   Psychiatric/Behavioral: Positive for memory loss. Negative for depression, hallucinations, substance abuse and suicidal ideas. The patient is not nervous/anxious and does not have insomnia.     Blood pressure (!) 118/52, pulse 66, temperature 98.6 F (37 C), temperature source Oral, resp. rate 17, height 5' 3.5" (1.613 m), weight 68.9 kg (151 lb 14.4 oz), SpO2 100 %.Body mass index is  26.49 kg/m.  General Appearance: Disheveled  Eye Contact:  Minimal  Speech:  Slow  Volume:  Decreased  Mood:  Dysphoric  Affect:  Flat  Thought Process:  Disorganized  Orientation:  Negative  Thought Content:  Illogical  Suicidal Thoughts:  No  Homicidal Thoughts:  No  Memory:  Immediate;   Fair Recent;   Poor Remote;   Fair  Judgement:  Fair  Insight:  Shallow  Psychomotor Activity:  Decreased  Concentration:  Concentration: Poor  Recall:  Poor  Fund of Knowledge:  Fair  Language:  Fair  Akathisia:  No  Handed:  Right  AIMS (if indicated):     Assets:  Desire for Improvement Housing Resilience Social Support  ADL's:  Impaired  Cognition:  Impaired,  Moderate  Sleep:  Number of Hours: 5     Treatment Plan Summary: Daily contact with patient to assess and evaluate symptoms  and progress in treatment, Medication management and Plan ECT today and then also on Friday.  Continue current medication.  Patient should be getting dialysis this afternoon if she can stay sedated enough to have the procedure completed.  Orders done for sedating medicine especially Ativan and Geodon.  Alethia Berthold, MD 10/07/2017, 6:38 PM

## 2017-10-07 NOTE — Progress Notes (Signed)
Patient alert and oriented x 3 with confusion to place and situation, affect is brighter, thoughts are less disorganized, continues to be on 1:1 while awake; sitter at bedside, no distress noted, will continue to monitor.

## 2017-10-07 NOTE — Progress Notes (Signed)
Patient in bed asleep no distrss noted will continue to monitor.

## 2017-10-07 NOTE — Progress Notes (Signed)
Shift Notes"  0800-Patient in room getting her labs drawn, no distress noted not verbalized by the patient. 0900-Patient taken to ECT procedure. 1000-Patinet remains in ECT. 1100- Patient remains off of the unit for ECT procedure. 1200- Patient in room asleep at this time, arrival from ECT unremarakable. 1300-Patient alert and oriented to self and time, sitting up in bed eating her lunch tray without difficulty. Communicating with this Probation officer. 1400- In bed asleep at this time, no distress noted. 1500-Patient transported to dialysis at this time. Sitter at bedside as well. Alert and responsive.

## 2017-10-07 NOTE — Anesthesia Post-op Follow-up Note (Signed)
Anesthesia QCDR form completed.        

## 2017-10-07 NOTE — Progress Notes (Signed)
Sitter at bedside patient resting in bed with eyes closed no distress noted.

## 2017-10-07 NOTE — Anesthesia Postprocedure Evaluation (Signed)
Anesthesia Post Note  Patient: Mariapaula Krist  Procedure(s) Performed: ECT TX  Patient location during evaluation: PACU Anesthesia Type: General Level of consciousness: awake and alert Pain management: pain level controlled Vital Signs Assessment: post-procedure vital signs reviewed and stable Respiratory status: spontaneous breathing, nonlabored ventilation, respiratory function stable and patient connected to nasal cannula oxygen Cardiovascular status: blood pressure returned to baseline and stable Postop Assessment: no apparent nausea or vomiting Anesthetic complications: no     Last Vitals:  Vitals:   10/07/17 1212 10/07/17 1225  BP: (!) 127/55 117/68  Pulse: (!) 59 (!) 55  Resp: 15 (!) 21  Temp:  (!) 36.2 C  SpO2: 98% 99%    Last Pain:  Vitals:   10/07/17 1225  TempSrc:   PainSc: 0-No pain                 Martha Clan

## 2017-10-07 NOTE — Tx Team (Signed)
Interdisciplinary Treatment and Diagnostic Plan Update  10/07/2017 Time of Session: 10:20am Joyce [name] MRN: [id number]  Principal Diagnosis: Bipolar affective disorder, current episode manic with psychotic symptoms ([place])  Secondary Diagnoses: Principal Problem:   Bipolar affective disorder, current episode depressed with psychotic symptoms ([place]) Active Problems:   ESRD on dialysis [facility])   Palliative care encounter   Current Medications:  Current Facility-Administered Medications  Medication Dose Route Frequency Provider Last Rate Last Dose  . acetaminophen (TYLENOL) tablet 325 mg  325 mg Oral BID PRN Clapacs, [name], MD   325 mg at 10/06/17 1238  . alum & mag hydroxide-simeth (MAALOX/MYLANTA) 200-200-20 MG/5ML suspension 30 mL  30 mL Oral Q4H PRN Clapacs, [name], MD      . bisacodyl (DULCOLAX) EC tablet 5 mg  5 mg Oral Daily PRN Clapacs, [name], MD      . brimonidine (ALPHAGAN) 0.2 % ophthalmic solution 1 drop  1 drop Both Eyes BID Clapacs, [name], MD   1 drop at 10/07/17 0728  . [START ON 10/17/2017] cyanocobalamin ((VITAMIN B-12)) injection 1,000 mcg  1,000 mcg Intramuscular Q30 days Clapacs, [name], MD      . dicyclomine (BENTYL) tablet 20 mg  20 mg Oral TID AC Clapacs, [name], MD   20 mg at 10/06/17 1741  . feeding supplement (NEPRO CARB STEADY) liquid 237 mL  237 mL Oral BID BM Singh, [name], MD   237 mL at 10/06/17 1044  . haloperidol (HALDOL) tablet 5 mg  5 mg Oral Q8H PRN [name], [name], MD   5 mg at 10/06/17 1238   Or  . haloperidol lactate (HALDOL) injection 5 mg  5 mg Intramuscular Q8H PRN [name], [name], MD   5 mg at 10/01/17 0908  . lidocaine-prilocaine (EMLA) cream 1 application  1 application Topical Q M,W,F Clapacs, [name], MD   1 application at 40/98/11 1219  . magnesium hydroxide (MILK OF MAGNESIA) suspension 30 mL  30 mL Oral Daily PRN Clapacs, [name], MD      . metoprolol tartrate (LOPRESSOR) tablet 25 mg  25 mg Oral BID Gladstone Lighter, MD    25 mg at 10/06/17 1740  . midodrine (PROAMATINE) tablet 10 mg  10 mg Oral TID WC [name], MD   10 mg at 10/06/17 1740  . multivitamin (RENA-VIT) tablet 1 tablet  1 tablet Oral QHS [name], [name], MD   1 tablet at 10/06/17 2244  . OLANZapine zydis (ZYPREXA) disintegrating tablet 5 mg  5 mg Oral TID [name], [name], MD   5 mg at 10/06/17 1748  . pantoprazole (PROTONIX) EC tablet 40 mg  40 mg Oral Daily Clapacs, [name], MD   40 mg at 10/06/17 1246  . pentafluoroprop-tetrafluoroeth (GEBAUERS) aerosol   Topical PRN [name], MD      . timolol (TIMOPTIC) 0.5 % ophthalmic solution 1 drop  1 drop Both Eyes BID Clapacs, [name], MD   1 drop at 10/07/17 0814  . vitamin C (ASCORBIC ACID) tablet 500 mg  500 mg Oral BID [name], [name], MD   500 mg at 10/06/17 1740   Facility-Administered Medications Ordered in Other Encounters  Medication Dose Route Frequency Provider Last Rate Last Dose  . 0.9 %  sodium chloride infusion    Continuous PRN Dionne Bucy, CRNA       PTA Medications: Medications Prior to Admission  Medication Sig Dispense Refill Last Dose  . acetaminophen (TYLENOL) 325 MG tablet Take  325 mg 2 (two) times daily as needed by mouth for moderate pain or headache.    10/06/2017 at Unknown time  . brimonidine (ALPHAGAN) 0.2 % ophthalmic solution Place 2 (two) times daily into both eyes.   10/06/2017 at Unknown time  . carbamazepine (TEGRETOL) 200 MG tablet Take 200 mg by mouth 2 (two) times daily.   10/06/2017 at Unknown time  . cephALEXin (KEFLEX) 500 MG capsule Take 1 capsule (500 mg total) by mouth every 12 (twelve) hours. 5 capsule 0 10/06/2017 at Unknown time  . cholecalciferol (VITAMIN D) 1000 UNITS tablet Take 1,000 Units by mouth daily.   10/06/2017 at Unknown time  . cyanocobalamin (,VITAMIN B-12,) 1000 MCG/ML injection Inject 1,000 mcg into the muscle every 30 (thirty) days.   10/06/2017 at Unknown time  . dicyclomine (BENTYL) 20 MG tablet Take 1 tablet (20  mg total) by mouth 3 (three) times daily before meals. 30 tablet 0 10/06/2017 at Unknown time  . lidocaine-prilocaine (EMLA) cream Apply 1 application every Monday, Wednesday, and Friday topically. At dialysis  3 10/06/2017 at Unknown time  . midodrine (PROAMATINE) 10 MG tablet Take 10 mg by mouth daily.    10/06/2017 at Unknown time  . pantoprazole (PROTONIX) 40 MG tablet TAKE 1 TABLET EVERY DAY 90 tablet 3 10/06/2017 at Unknown time  . pramipexole (MIRAPEX) 0.125 MG tablet Take 0.125 mg by mouth daily at 2 PM.  3 10/06/2017 at Unknown time  . QUEtiapine (SEROQUEL) 25 MG tablet Take 50 mg by mouth at bedtime.    10/01/2017  . timolol (TIMOPTIC) 0.5 % ophthalmic solution Place 1 drop 2 (two) times daily into both eyes.    10/06/2017 at Unknown time  . ARIPiprazole (ABILIFY) 2 MG tablet Take 2 mg by mouth daily.     . bisacodyl (DULCOLAX) 5 MG EC tablet Take 1 tablet (5 mg total) by mouth daily as needed for moderate constipation. 30 tablet 0     Patient Stressors: Health problems  Patient Strengths: Ability for insight Active sense of humor Average or above average intelligence Capable of independent living Occupational psychologist fund of knowledge Supportive family/friends  Treatment Modalities: Medication Management, Group therapy, Case management,  1 to 1 session with clinician, Psychoeducation, Recreational therapy.   Physician Treatment Plan for Primary Diagnosis: Bipolar affective disorder, current episode manic with psychotic symptoms ([place]) Long Term Goal(s): Improvement in symptoms so as ready for discharge NA   Short Term Goals: Ability to identify changes in lifestyle to reduce recurrence of condition will improve Ability to verbalize feelings will improve Ability to disclose and discuss suicidal ideas Ability to demonstrate self-control will improve Ability to identify and develop effective coping behaviors will improve Ability to identify triggers  associated with substance abuse/mental health issues will improve NA  Medication Management: Evaluate patient's response, side effects, and tolerance of medication regimen.  Therapeutic Interventions: 1 to 1 sessions, Unit Group sessions and Medication administration.  Evaluation of Outcomes: Progressing  Physician Treatment Plan for Secondary Diagnosis: Principal Problem:   Bipolar affective disorder, current episode depressed with psychotic symptoms ([place]) Active Problems:   ESRD on dialysis [facility])   Palliative care encounter  Long Term Goal(s): Improvement in symptoms so as ready for discharge NA   Short Term Goals: Ability to identify changes in lifestyle to reduce recurrence of condition will improve Ability to verbalize feelings will improve Ability to disclose and discuss suicidal ideas Ability to demonstrate self-control will improve Ability to identify and develop effective  coping behaviors will improve Ability to identify triggers associated with substance abuse/mental health issues will improve NA     Medication Management: Evaluate patient's response, side effects, and tolerance of medication regimen.  Therapeutic Interventions: 1 to 1 sessions, Unit Group sessions and Medication administration.  Evaluation of Outcomes: Progressing   RN Treatment Plan for Primary Diagnosis: Bipolar affective disorder, current episode manic with psychotic symptoms ([place]) Long Term Goal(s): Knowledge of disease and therapeutic regimen to maintain health will improve  Short Term Goals: Ability to demonstrate self-control and Compliance with prescribed medications will improve  Medication Management: RN will administer medications as ordered by provider, will assess and evaluate patient's response and provide education to patient for prescribed medication. RN will report any adverse and/or side effects to prescribing provider.  Therapeutic Interventions: 1 on 1 counseling sessions,  Psychoeducation, Medication administration, Evaluate responses to treatment, Monitor vital signs and CBGs as ordered, Perform/monitor CIWA, COWS, AIMS and Fall Risk screenings as ordered, Perform wound care treatments as ordered.  Evaluation of Outcomes: Progressing   LCSW Treatment Plan for Primary Diagnosis: Bipolar affective disorder, current episode manic with psychotic symptoms ([place]) Long Term Goal(s): Safe transition to appropriate next level of care at discharge, Engage patient in therapeutic group addressing interpersonal concerns.  Short Term Goals: Engage patient in aftercare planning with referrals and resources, Increase social support and Increase skills for wellness and recovery  Therapeutic Interventions: Assess for all discharge needs, 1 to 1 time with Social worker, Explore available resources and support systems, Assess for adequacy in community support network, Educate family and significant other(s) on suicide prevention, Complete Psychosocial Assessment, Interpersonal group therapy.  Evaluation of Outcomes: Not Progressing   Progress in Treatment: Attending groups: No. Participating in groups: No. Taking medication as prescribed: Yes. Toleration medication: Yes. Family/Significant other contact made: No, will contact:  Patient refused family contact Patient understands diagnosis: Yes. Discussing patient identified problems/goals with staff: Yes. Medical problems stabilized or resolved: Yes. Denies suicidal/homicidal ideation: Yes. Issues/concerns per patient self-inventory: No. Other:   New problem(s) identified: No, Describe:  None  New Short Term/Long Term Goal(s): "To be able to focus and trust a person and not doubt my doctor."  Discharge Plan or Barriers: To return home or to a assisted living faciliuty and follow up with establish provider.   Reason for Continuation of Hospitalization: Medical Issues Medication stabilization  Estimated Length of Stay:  5-7 days  Attendees: Patient:  10/07/2017 10:23 AM  Physician: Dr. Alethia Berthold, MD 10/07/2017 10:23 AM  Nursing:  10/07/2017 10:23 AM  RN Care Manager:  10/07/2017 10:23 AM  Social Worker: Darin Engels, [place] 10/07/2017 10:23 AM  Recreational Therapist:  10/07/2017 10:23 AM  Other:  10/07/2017 10:23 AM  Other:  10/07/2017 10:23 AM  Other: 10/07/2017 10:23 AM    Scribe for Treatment Team: Darin Engels, LCSW 10/07/2017 10:23 AM

## 2017-10-07 NOTE — Anesthesia Preprocedure Evaluation (Signed)
Anesthesia Evaluation  Patient identified by MRN, date of birth, ID band Patient awake    Reviewed: Allergy & Precautions, H&P , NPO status , Patient's Chart, lab work & pertinent test results, reviewed documented beta blocker date and time   History of Anesthesia Complications (+) PONV, Family history of anesthesia reaction and history of anesthetic complications  Airway Mallampati: II  TM Distance: >3 FB Neck ROM: full   Comment: Patient not cooperative Dental  (+) Poor Dentition, Dental Advidsory Given   Pulmonary neg pulmonary ROS,           Cardiovascular Exercise Tolerance: Good hypertension, (-) angina(-) CAD, (-) Past MI, (-) Cardiac Stents and (-) CABG (-) dysrhythmias + Valvular Problems/Murmurs      Neuro/Psych  Headaches, neg Seizures PSYCHIATRIC DISORDERS Bipolar Disorder    GI/Hepatic Neg liver ROS, hiatal hernia, GERD  ,  Endo/Other  negative endocrine ROS  Renal/GU ESRF and DialysisRenal disease  negative genitourinary   Musculoskeletal   Abdominal   Peds  Hematology negative hematology ROS (+)   Anesthesia Other Findings Past Medical History: No date: Anal fissure No date: Bipolar affective disorder (HCC) No date: CKD (chronic kidney disease)     Comment:  Dr Holley Raring [name] 4 No date: Colon polyps No date: Diverticulitis No date: Diverticulitis No date: Family history of adverse reaction to anesthesia     Comment:  mom - PONV No date: GERD (gastroesophageal reflux disease) No date: Headache     Comment:  migraines - none over 10 yrs No date: Heart murmur No date: History of hiatal hernia No date: Hypertension No date: Pancreatitis     Comment:  Valproic acid No date: Vertigo   Reproductive/Obstetrics negative OB ROS                             Anesthesia Physical  Anesthesia Plan  ASA: IV  Anesthesia Plan: General   Post-op Pain Management:    Induction:  Intravenous  PONV Risk Score and Plan: 4 or greater and Treatment may vary due to age or medical condition  Airway Management Planned: Nasal Cannula  Additional Equipment:   Intra-op Plan:   Post-operative Plan:   Informed Consent: I have reviewed the patients History and Physical, chart, labs and discussed the procedure including the risks, benefits and alternatives for the proposed anesthesia with the patient or authorized representative who has indicated his/her understanding and acceptance.   Dental Advisory Given  Plan Discussed with: CRNA  Anesthesia Plan Comments:         Anesthesia Quick Evaluation

## 2017-10-07 NOTE — Progress Notes (Signed)
1600- Patient in dialysis, sitter at side. 1700-Received a call from dialysis nurse requesting that patient gets something for agitation. "She keeps moving around and I've had to start the process over."\ 1800: Patient remains at dialysis with sitter at her side. 1900-Patient remains at dialysis with sitter at her side.

## 2017-10-07 NOTE — Progress Notes (Signed)
Condition unchanged, alert/ oriented x 2 , sitter at bedside no distress noted will continue to monitor.

## 2017-10-07 NOTE — Progress Notes (Addendum)
[facility]  ROUNDING NOTE   Subjective:   Patient seen in dialysis suite Tolerating well   HEMODIALYSIS FLOWSHEET:  Blood Flow Rate (mL/min): 400 mL/min Arterial Pressure (mmHg): -150 mmHg Venous Pressure (mmHg): 220 mmHg Transmembrane Pressure (mmHg): 40 mmHg Ultrafiltration Rate (mL/min): 170 mL/min Dialysate Flow Rate (mL/min): 600 ml/min Conductivity: Machine : 14.3 Conductivity: Machine : 14.3 Dialysis Fluid Bolus: Normal Saline Bolus Amount (mL): 250 mL Dialysate Change: (3k 2.5ca)  Patient is a lot more interactive today Had ECT this AM Sittes states that she ate a good lunch No SOB   Objective:  Vital signs in last 24 hours:  Temp:  [97.1 F (36.2 C)-98.6 F (37 C)] 98.6 F (37 C) (03/20 1515) Pulse Rate:  [51-70] 61 (03/20 1545) Resp:  [14-22] 21 (03/20 1545) BP: (113-131)/(34-68) 129/61 (03/20 1545) SpO2:  [95 %-99 %] 99 % (03/20 1545) Weight:  [68.9 kg (151 lb 14.4 oz)-68.9 kg (152 lb)] 68.9 kg (151 lb 14.4 oz) (03/20 1515)  Weight change:  Filed Weights   10/07/17 0949 10/07/17 1515  Weight: 68.9 kg (152 lb) 68.9 kg (151 lb 14.4 oz)    Intake/Output: I/O last 3 completed shifts: In: 480 [P.O.:480] Out: -    Intake/Output this shift:  Total I/O In: 490 [P.O.:240; I.V.:250] Out: -   Physical Exam: General: NAD, Sitting up in chair  Head: Normocephalic, atraumatic. dry oral mucosal membranes  Eyes: Anicteric  Neck: Supple, trachea midline  Lungs:  Clear to auscultation  Heart: Regular rate and rhythm  Abdomen:  Soft, nontender  Extremities: no peripheral edema.  Psych:  Flat affect. Slow to respond  Neurologic: Nonfocal, moving all four extremities  Skin: No lesions  Access: Left AVF, good bruit    Basic Metabolic Panel: Recent Labs  Lab 10/01/17 0711 10/02/17 1559 10/05/17 0941 10/05/17 1941 10/07/17 0737  NA 141 143 141 134*  --   K 4.0 4.1 4.1 3.9 4.2  CL 99* 101  --  96*  --   CO2 23 22  --  22  --    GLUCOSE 117* 113* 108* 105*  --   BUN 51* 76*  --  86*  --   CREATININE 7.39* 9.74*  --  11.38*  --   CALCIUM 10.3 9.7  --  8.9  --   PHOS 4.0 4.7*  --  4.8*  --     Liver Function Tests: Recent Labs  Lab 10/01/17 0711 10/02/17 1559 10/05/17 1941  ALBUMIN 4.2 4.0 3.5   No results for input(s): LIPASE, AMYLASE in the last 168 hours. Recent Labs  Lab 10/01/17 0711  AMMONIA 16    CBC: Recent Labs  Lab 10/01/17 0711 10/02/17 1559 10/05/17 0941 10/05/17 1941  WBC 10.7 11.5*  --  11.3*  HGB 13.1 12.1 10.5* 10.8*  HCT 39.0 37.1 31.0* 31.7*  MCV 95.4 96.9  --  94.8  PLT 367 385  --  364    Cardiac Enzymes: No results for input(s): CKTOTAL, CKMB, CKMBINDEX, TROPONINI in the last 168 hours.  BNP: Invalid input(s): POCBNP  CBG: Recent Labs  Lab 10/02/17 0610 10/02/17 2122 10/05/17 0702 10/07/17 0626  GLUCAP 104* 103* 104* 94    Microbiology: Results for orders placed or performed during the hospital encounter of 09/01/17  Urine culture     Status: Abnormal   Collection Time: 09/01/17  4:32 PM  Result Value Ref Range Status   Specimen Description   Final    URINE, RANDOM Performed at  [facility], [address], [place], [place] [address]    Special Requests   Final    NONE Performed at [facility], [address] [place], [place] [address]    Culture MULTIPLE SPECIES PRESENT, SUGGEST RECOLLECTION (A)  Final   Report Status 09/03/2017 FINAL  Final  Culture, blood (routine x 2)     Status: None   Collection Time: 09/01/17  6:05 PM  Result Value Ref Range Status   Specimen Description BLOOD RIGHT HAND  Final   Special Requests   Final    BOTTLES DRAWN AEROBIC AND ANAEROBIC Blood Culture results may not be optimal due to an excessive volume of blood received in culture bottles   Culture   Final    NO GROWTH 5 DAYS Performed at [facility], [address], [place], [place] [address]    Report Status 09/06/2017 FINAL   Final  Culture, blood (routine x 2)     Status: None   Collection Time: 09/01/17  6:34 PM  Result Value Ref Range Status   Specimen Description BLOOD RAC  Final   Special Requests   Final    BOTTLES DRAWN AEROBIC AND ANAEROBIC Blood Culture adequate volume   Culture   Final    NO GROWTH 5 DAYS Performed at [facility], [address], [place], [place] [address]    Report Status 09/06/2017 FINAL  Final    Coagulation Studies: No results for input(s): LABPROT, INR in the last 72 hours.  Urinalysis: No results for input(s): COLORURINE, LABSPEC, PHURINE, GLUCOSEU, HGBUR, BILIRUBINUR, KETONESUR, PROTEINUR, UROBILINOGEN, NITRITE, LEUKOCYTESUR in the last 72 hours.  Invalid input(s): APPERANCEUR    Imaging: No results found.   Medications:    . brimonidine  1 drop Both Eyes BID  . [START ON 10/17/2017] cyanocobalamin  1,000 mcg Intramuscular Q30 days  . dicyclomine  20 mg Oral TID AC  . feeding supplement (NEPRO CARB STEADY)  237 mL Oral BID BM  . lidocaine-prilocaine  1 application Topical Q M,W,F  . LORazepam  2 mg Intramuscular STAT  . metoprolol tartrate  25 mg Oral BID  . midodrine  10 mg Oral TID WC  . multivitamin  1 tablet Oral QHS  . OLANZapine zydis  5 mg Oral TID  . pantoprazole  40 mg Oral Daily  . timolol  1 drop Both Eyes BID  . vitamin C  500 mg Oral BID  . ziprasidone  10 mg Intramuscular STAT   acetaminophen, alum & mag hydroxide-simeth, bisacodyl, haloperidol **OR** haloperidol lactate, LORazepam, magnesium hydroxide, pentafluoroprop-tetrafluoroeth  Assessment/ Plan:  [name] is a 72 y.o. white female with end stage renal disease on hemodialysis, diverticulosis, hypotension, bipolar disorder, polycystic kidney disease   [facility]/MWF  1.  ESRD on HD MWF.   Doing better tolerating HD well today. However sitter still had to hold her arm to prevent excessive movement Next HD planned for Friday  2.  Anemia of CKD:  hemoglobin  10.8 -We will resume Epogen with hemodialysis treatments  3.  Secondary hyperparathyroidism:  Not currently on binders.   4. Hypotension:  continue midodrine prior to dialysis.   5. Delirium, bipolar affective disorder, major depression Psychiatry evaluation ongoing Currently patient is undergoing ECT treatments   LOS: [place] 3/20/20195:34 PM

## 2017-10-07 NOTE — Procedures (Signed)
ECT SERVICES Physician's Interval Evaluation & Treatment Note  Patient Identification: Joyce [name] MRN:  [id number] Date of Evaluation:  10/07/2017 TX #: 3  MADRS:   MMSE:   P.E. Findings:  No change to physical exam  Psychiatric Interval Note:  Patient is more awake and alert a little more communicative  Subjective:  Patient is a 72 y.o. female seen for evaluation for Electroconvulsive Therapy. Patient continues to feel tired and run down  Treatment Summary:   []   Right Unilateral             [x]  Bilateral   % Energy : 1.0 ms 50%   Impedance: [place] 970 ohms  Seizure Energy Index: No reading  Postictal Suppression Index: No reading  Seizure Concordance Index: No reading  Medications  Pre Shock: 70 mg Brevital 80 mg succinylcholine  Post Shock: 2 mg Ativan  Seizure Duration: 17 seconds EMG 56 seconds EEG   Comments: I do not know why the computer did not make a reading but the patient had a very clear-cut and robust looking seizure with a clear endpoint.  Next treatment on Friday  Lungs:  [x]   Clear to auscultation               []  Other:   Heart:    [x]   Regular rhythm             []  irregular rhythm    [x]   Previous H&P reviewed, patient examined and there are NO CHANGES                 []   Previous H&P reviewed, patient examined and there are changes noted.   Alethia Berthold, MD 3/20/201911:30 AM

## 2017-10-07 NOTE — Progress Notes (Signed)
Sitter at bedside.

## 2017-10-07 NOTE — H&P (Signed)
Joyce [name] is an 72 y.o. female.   Chief Complaint: Patient continues to be fatigued and run down and have some endorsement of depression HPI: History of bipolar disorder much worse since dialysis.  Currently recovering from a delirious her catatonic level of depression  Past Medical History:  Diagnosis Date  . Anal fissure   . Bipolar affective disorder ([place])   . CKD (chronic kidney disease)    Dr Holley Raring [name] 4  . Colon polyps   . Diverticulitis   . Diverticulitis   . Family history of adverse reaction to anesthesia    mom - PONV  . GERD (gastroesophageal reflux disease)   . Headache    migraines - none over 10 yrs  . Heart murmur   . History of hiatal hernia   . Hypertension   . Pancreatitis    Valproic acid  . Vertigo     Past Surgical History:  Procedure Laterality Date  . A/V FISTULAGRAM Left 04/14/2017   Procedure: A/V Fistulagram;  Surgeon: Katha Cabal, MD;  Location: [facility];  Service: Cardiovascular;  Laterality: Left;  . A/V FISTULAGRAM Left 06/09/2017   Procedure: A/V FISTULAGRAM;  Surgeon: Katha Cabal, MD;  Location: [facility];  Service: Cardiovascular;  Laterality: Left;  . ABDOMINAL HYSTERECTOMY  1990 ?  . AV FISTULA PLACEMENT  4/30  . BREAST EXCISIONAL BIOPSY Left 1994   neg surgical bx  . CHOLECYSTECTOMY  2003  . COLONOSCOPY  2014   Dr. Jamal Collin  . COLONOSCOPY WITH PROPOFOL N/A 09/24/2015   Procedure: COLONOSCOPY WITH random colon byopies.;  Surgeon: Lucilla Lame, MD;  Location: [facility];  Service: Endoscopy;  Laterality: N/A;  . ESOPHAGOGASTRODUODENOSCOPY (EGD) WITH PROPOFOL N/A 09/24/2015   Procedure: ESOPHAGOGASTRODUODENOSCOPY (EGD) ;  Surgeon: Lucilla Lame, MD;  Location: [facility];  Service: Endoscopy;  Laterality: N/A;  . EYE SURGERY    . PERIPHERAL VASCULAR CATHETERIZATION N/A 05/29/2015   Procedure: A/V Shuntogram/Fistulagram;  Surgeon: Katha Cabal, MD;  Location: [facility];   Service: Cardiovascular;  Laterality: N/A;  . PERIPHERAL VASCULAR CATHETERIZATION N/A 05/29/2015   Procedure: A/V Shunt Intervention;  Surgeon: Katha Cabal, MD;  Location: [facility];  Service: Cardiovascular;  Laterality: N/A;  . POLYPECTOMY  09/24/2015   Procedure: POLYPECTOMY INTESTINAL;  Surgeon: Lucilla Lame, MD;  Location: [facility];  Service: Endoscopy;;  cecal polyp ascending polyp    Family History  Problem Relation Age of Onset  . Stroke Father   . Hypertension Father   . Breast cancer Other   . Colon cancer Neg Hx   . Liver disease Neg Hx    Social History:  reports that  has never smoked. she has never used smokeless tobacco. She reports that she does not drink alcohol or use drugs.  Allergies:  Allergies  Allergen Reactions  . Morphine And Related Shortness Of Breath    Pt reports chest pain and difficulty breathing.  . Indomethacin Hives  . Pollen Extract Other (See Comments)    Sinus problems and HA    Medications Prior to Admission  Medication Sig Dispense Refill  . acetaminophen (TYLENOL) 325 MG tablet Take 325 mg 2 (two) times daily as needed by mouth for moderate pain or headache.     . brimonidine (ALPHAGAN) 0.2 % ophthalmic solution Place 2 (two) times daily into both eyes.    . carbamazepine (TEGRETOL) 200 MG tablet Take 200 mg by mouth 2 (two)  times daily.    . cephALEXin (KEFLEX) 500 MG capsule Take 1 capsule (500 mg total) by mouth every 12 (twelve) hours. 5 capsule 0  . cholecalciferol (VITAMIN D) 1000 UNITS tablet Take 1,000 Units by mouth daily.    . cyanocobalamin (,VITAMIN B-12,) 1000 MCG/ML injection Inject 1,000 mcg into the muscle every 30 (thirty) days.    Marland Kitchen dicyclomine (BENTYL) 20 MG tablet Take 1 tablet (20 mg total) by mouth 3 (three) times daily before meals. 30 tablet 0  . lidocaine-prilocaine (EMLA) cream Apply 1 application every Monday, Wednesday, and Friday topically. At dialysis  3  . midodrine (PROAMATINE) 10 MG  tablet Take 10 mg by mouth daily.     . pantoprazole (PROTONIX) 40 MG tablet TAKE 1 TABLET EVERY DAY 90 tablet 3  . pramipexole (MIRAPEX) 0.125 MG tablet Take 0.125 mg by mouth daily at 2 PM.  3  . QUEtiapine (SEROQUEL) 25 MG tablet Take 50 mg by mouth at bedtime.     . timolol (TIMOPTIC) 0.5 % ophthalmic solution Place 1 drop 2 (two) times daily into both eyes.     . ARIPiprazole (ABILIFY) 2 MG tablet Take 2 mg by mouth daily.    . bisacodyl (DULCOLAX) 5 MG EC tablet Take 1 tablet (5 mg total) by mouth daily as needed for moderate constipation. 30 tablet 0    Results for orders placed or performed during the hospital encounter of 09/17/17 (from the past 48 hour(s))  Renal function panel     Status: Abnormal   Collection Time: 10/05/17  7:41 PM  Result Value Ref Range   Sodium 134 (L) 135 - 145 mmol/L   Potassium 3.9 3.5 - 5.1 mmol/L   Chloride 96 (L) 101 - 111 mmol/L   CO2 22 22 - 32 mmol/L   Glucose, Bld 105 (H) 65 - 99 mg/dL   BUN 86 (H) 6 - 20 mg/dL   Creatinine, Ser 11.38 (H) 0.44 - 1.00 mg/dL   Calcium 8.9 8.9 - 10.3 mg/dL   Phosphorus 4.8 (H) 2.5 - 4.6 mg/dL   Albumin 3.5 3.5 - 5.0 g/dL   GFR calc non Af Amer 3 (L) >60 mL/min   GFR calc Af Amer 3 (L) >60 mL/min    Comment: (NOTE) The eGFR has been calculated using the CKD EPI equation. This calculation has not been validated in all clinical situations. eGFR's persistently <60 mL/min signify possible Chronic Kidney Disease.    Anion gap 16 (H) 5 - 15    Comment: Performed at [facility], [address] [place], [place] [address]  CBC     Status: Abnormal   Collection Time: 10/05/17  7:41 PM  Result Value Ref Range   WBC 11.3 (H) 3.6 - 11.0 K/uL   RBC 3.35 (L) 3.80 - 5.20 MIL/uL   Hemoglobin 10.8 (L) 12.0 - 16.0 g/dL   HCT 31.7 (L) 35.0 - 47.0 %   MCV 94.8 80.0 - 100.0 fL   MCH 32.3 26.0 - 34.0 pg   MCHC 34.1 32.0 - 36.0 g/dL   RDW 14.1 11.5 - 14.5 %   Platelets 364 150 - 440 K/uL    Comment: Performed at  [facility], [address] [place], [place] [address]  Glucose, capillary     Status: None   Collection Time: 10/07/17  6:26 AM  Result Value Ref Range   Glucose-Capillary 94 65 - 99 mg/dL  Potassium     Status: None   Collection Time:  10/07/17  7:37 AM  Result Value Ref Range   Potassium 4.2 3.5 - 5.1 mmol/L    Comment: Performed at [facility], [facility] [address] [place], [place] [address]   No results found.  Review of Systems  Constitutional: Positive for malaise/fatigue.  HENT: Negative.   Eyes: Negative.   Respiratory: Negative.   Cardiovascular: Negative.   Gastrointestinal: Negative.   Musculoskeletal: Negative.   Skin: Negative.   Neurological: Negative.   Psychiatric/Behavioral: Positive for depression and memory loss. Negative for hallucinations, substance abuse and suicidal ideas. The patient is not nervous/anxious and does not have insomnia.     Blood pressure (!) 114/34, pulse (!) 51, temperature 97.6 F (36.4 C), temperature source Oral, resp. rate 14, height 5' 3.5" (1.613 m), weight 68.9 kg (152 lb), SpO2 97 %. Physical Exam  Nursing note and vitals reviewed. Constitutional: She appears well-developed and well-nourished.  HENT:  Head: Normocephalic and atraumatic.  Eyes: Conjunctivae are normal. Pupils are equal, round, and reactive to light.  Neck: Normal range of motion.  Cardiovascular: Regular rhythm and normal heart sounds.  Respiratory: Effort normal. No respiratory distress.  GI: Soft.  Musculoskeletal: Normal range of motion.  Neurological: She is alert.  Skin: Skin is warm and dry.  Psychiatric: Her affect is blunt. Her speech is delayed. She is slowed. Cognition and memory are impaired. She expresses impulsivity. She expresses no suicidal ideation.     Assessment/Plan Treatment today with specific plan and that we will have dialysis this afternoon after giving extra medication for agitation to try and keep her sedated during  the procedure.  Follow-up ECT on Friday  Alethia Berthold, MD 10/07/2017, 11:29 AM

## 2017-10-07 NOTE — Transfer of Care (Signed)
Immediate Anesthesia Transfer of Care Note  Patient: Joyce [name]  Procedure(s) Performed: ECT TX  Patient Location: PACU  Anesthesia Type:General  Level of Consciousness: sedated  Airway & Oxygen Therapy: Patient Spontanous Breathing and Patient connected to face mask oxygen  Post-op Assessment: Report given to RN and Post -op Vital signs reviewed and stable  Post vital signs: Reviewed and stable  Last Vitals:  Vitals:   10/07/17 0949 10/07/17 1147  BP: (!) 114/34 (!) 117/52  Pulse: (!) 51 (!) 56  Resp: 14 17  Temp: 36.4 C (!) 36.2 C  SpO2: 97% 99%    Last Pain:  Vitals:   10/07/17 0949  TempSrc: Oral  PainSc: 0-No pain         Complications: No apparent anesthesia complications

## 2017-10-07 NOTE — Anesthesia Procedure Notes (Signed)
Date/Time: 10/07/2017 11:36 AM Performed by: Dionne Bucy, CRNA Pre-anesthesia Checklist: Patient identified, Emergency Drugs available, Suction available and Patient being monitored Patient Re-evaluated:Patient Re-evaluated prior to induction Oxygen Delivery Method: Circle system utilized Preoxygenation: Pre-oxygenation with 100% oxygen Induction Type: IV induction Ventilation: Mask ventilation without difficulty and Mask ventilation throughout procedure Airway Equipment and Method: Bite block Placement Confirmation: positive ETCO2 Dental Injury: Teeth and Oropharynx as per pre-operative assessment

## 2017-10-07 NOTE — Progress Notes (Signed)
Patient is asleep no distress noted , will continue to monitor.

## 2017-10-07 NOTE — Plan of Care (Signed)
Patient verbalizing understanding of  information received. No safety concerns voiced or observed.  Less anxiety reported.

## 2017-10-07 NOTE — Progress Notes (Signed)
Sitter at bedside no distress noted, affect is brighter , appetite noted to have improved; patient eating snacks anf drinking fluids, will continue to monitor.

## 2017-10-08 NOTE — Progress Notes (Signed)
[facility] MD Progress Note  10/08/2017 7:12 PM Joyce [name]  MRN:  [id number] Subjective: Follow-up for this 72 year old woman with a history of bipolar disorder who is currently getting ECT.  Spoke with the patient, reviewed the chart, spoke with the patient's sister as well.  Sister was of the opinion that that the patient is "not getting better" but it appears to me that she actually is improved.  Patient had come out of her room and walked around a little bit before going back to bed.  She is still not eating well however when I went to talk with her she was awake and spoke a little bit back and forth although she is still inappropriate with no insight.  She is asking me if she can "go home" even though she is doing virtually nothing to take care of herself at this point.  I explained to her how going home would be a complete disaster with her current behavior but that I expected her to improve over the next week or 2. Principal Problem: Bipolar affective disorder, current episode manic with psychotic symptoms ([place]) Diagnosis:   Patient Active Problem List   Diagnosis Date Noted  . Palliative care encounter [Z51.5]   . Bipolar affective disorder, current episode depressed with psychotic symptoms ([place]) [F31.2] 09/17/2017  . Bipolar I disorder, most recent episode (or current) manic ([place]) [F31.10] 09/02/2017  . Acute delirium [R41.0] 09/02/2017  . Altered mental status [R41.82]   . Acute encephalopathy [G93.40] 09/01/2017  . ESRD on dialysis ([place]) [N18.6, Z99.2] 04/06/2017  . Complication of vascular access for dialysis [T82.9XXA] 04/06/2017  . Hematuria [R31.9] 12/18/2016  . Polycystic kidney [Q61.3] 10/12/2016  . Ruptured cyst of kidney [Q61.00] 10/03/2016  . HTN (hypertension) [I10] 10/01/2016  . Chronic kidney disease [N18.9] 10/01/2016  . Anemia associated with chronic renal failure [D63.1] 09/24/2016  . Acute kidney insufficiency [N28.9] 07/28/2016  . Noninfectious diarrhea [K52.9]   .  Benign neoplasm of cecum [D12.0]   . Benign neoplasm of ascending colon [D12.2]   . Diarrhea [R19.7]   . Nausea [R11.0]   . Gastric polyp [K31.7]   . Chronic constipation [K59.09] 12/27/2014  . Nausea with vomiting [R11.2] 12/27/2014  . Left sided abdominal pain [R10.9] 12/27/2014  . Personal history of colonic polyps [Z86.010] 05/10/2013   Total Time spent with patient: 20 minutes  Past Psychiatric History: Long history of bipolar disorder with this current episode much worse since starting dialysis  Past Medical History:  Past Medical History:  Diagnosis Date  . Anal fissure   . Bipolar affective disorder ([place])   . CKD (chronic kidney disease)    Dr Holley Raring [name] 4  . Colon polyps   . Diverticulitis   . Diverticulitis   . Family history of adverse reaction to anesthesia    mom - PONV  . GERD (gastroesophageal reflux disease)   . Headache    migraines - none over 10 yrs  . Heart murmur   . History of hiatal hernia   . Hypertension   . Pancreatitis    Valproic acid  . Vertigo     Past Surgical History:  Procedure Laterality Date  . A/V FISTULAGRAM Left 04/14/2017   Procedure: A/V Fistulagram;  Surgeon: Katha Cabal, MD;  Location: [facility];  Service: Cardiovascular;  Laterality: Left;  . A/V FISTULAGRAM Left 06/09/2017   Procedure: A/V FISTULAGRAM;  Surgeon: Katha Cabal, MD;  Location: [facility];  Service: Cardiovascular;  Laterality:  Left;  . ABDOMINAL HYSTERECTOMY  1990 ?  . AV FISTULA PLACEMENT  4/30  . BREAST EXCISIONAL BIOPSY Left 1994   neg surgical bx  . CHOLECYSTECTOMY  2003  . COLONOSCOPY  2014   Dr. Jamal Collin  . COLONOSCOPY WITH PROPOFOL N/A 09/24/2015   Procedure: COLONOSCOPY WITH random colon byopies.;  Surgeon: Lucilla Lame, MD;  Location: [facility];  Service: Endoscopy;  Laterality: N/A;  . ESOPHAGOGASTRODUODENOSCOPY (EGD) WITH PROPOFOL N/A 09/24/2015   Procedure: ESOPHAGOGASTRODUODENOSCOPY (EGD) ;  Surgeon: Lucilla Lame, MD;  Location: [facility];  Service: Endoscopy;  Laterality: N/A;  . EYE SURGERY    . PERIPHERAL VASCULAR CATHETERIZATION N/A 05/29/2015   Procedure: A/V Shuntogram/Fistulagram;  Surgeon: Katha Cabal, MD;  Location: [facility];  Service: Cardiovascular;  Laterality: N/A;  . PERIPHERAL VASCULAR CATHETERIZATION N/A 05/29/2015   Procedure: A/V Shunt Intervention;  Surgeon: Katha Cabal, MD;  Location: [facility];  Service: Cardiovascular;  Laterality: N/A;  . POLYPECTOMY  09/24/2015   Procedure: POLYPECTOMY INTESTINAL;  Surgeon: Lucilla Lame, MD;  Location: [facility];  Service: Endoscopy;;  cecal polyp ascending polyp   Family History:  Family History  Problem Relation Age of Onset  . Stroke Father   . Hypertension Father   . Breast cancer Other   . Colon cancer Neg Hx   . Liver disease Neg Hx    Family Psychiatric  History: Unknown Social History:  Social History   Substance and Sexual Activity  Alcohol Use No  . Alcohol/week: 0.0 oz     Social History   Substance and Sexual Activity  Drug Use No    Social History   Socioeconomic History  . Marital status: Single    Spouse name: Not on file  . Number of children: 1  . Years of education: Not on file  . Highest education level: Not on file  Occupational History  . Occupation: Surveyor, quantity: [employer]  Social Needs  . Financial resource strain: Not on file  . Food insecurity:    Worry: Not on file    Inability: Not on file  . Transportation needs:    Medical: Not on file    Non-medical: Not on file  Tobacco Use  . Smoking status: Never Smoker  . Smokeless tobacco: Never Used  Substance and Sexual Activity  . Alcohol use: No    Alcohol/week: 0.0 oz  . Drug use: No  . Sexual activity: Not Currently  Lifestyle  . Physical activity:    Days per week: Not on file    Minutes per session: Not on file  . Stress: Not on file  Relationships  . Social  connections:    Talks on phone: Not on file    Gets together: Not on file    Attends religious service: Not on file    Active member of club or organization: Not on file    Attends meetings of clubs or organizations: Not on file    Relationship status: Not on file  Other Topics Concern  . 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   Additional Social History:    History of alcohol / drug use?: No history of alcohol / drug abuse                    Sleep: Fair  Appetite:  Poor  Current Medications: Current Facility-Administered Medications  Medication Dose Route Frequency Provider Last Rate Last Dose  . acetaminophen (TYLENOL) tablet 325 mg  325 mg Oral BID PRN Deshone Lyssy, [name], MD   325 mg at 10/06/17 1238  . alum & mag hydroxide-simeth (MAALOX/MYLANTA) 200-200-20 MG/5ML suspension 30 mL  30 mL Oral Q4H PRN Jalexus Brett T, MD      . bisacodyl (DULCOLAX) EC tablet 5 mg  5 mg Oral Daily PRN Lori Popowski T, MD      . brimonidine (ALPHAGAN) 0.2 % ophthalmic solution 1 drop  1 drop Both Eyes BID Dashon Mcintire, [name], MD   1 drop at 10/07/17 0728  . [START ON 10/17/2017] cyanocobalamin ((VITAMIN B-12)) injection 1,000 mcg  1,000 mcg Intramuscular Q30 days Caleb Prigmore T, MD      . dicyclomine (BENTYL) tablet 20 mg  20 mg Oral TID AC Tome Wilson, [name], MD   20 mg at 10/07/17 1241  . [START ON 10/09/2017] epoetin alfa (EPOGEN,PROCRIT) injection 4,000 Units  4,000 Units Intravenous Q M,W,F-HD Singh, [name], MD      . feeding supplement (NEPRO CARB STEADY) liquid 237 mL  237 mL Oral BID BM Singh, [name], MD   237 mL at 10/07/17 1343  . haloperidol (HALDOL) tablet 5 mg  5 mg Oral Q8H PRN [name], [name], MD   5 mg at 10/06/17 1238   Or  . haloperidol lactate (HALDOL) injection 5 mg  5 mg Intramuscular Q8H PRN [name], [name], MD   5 mg at 10/01/17 0908  . lidocaine-prilocaine (EMLA) cream 1  application  1 application Topical Q M,W,F Azaria Stegman, [name], MD   1 application at 52/77/82 1219  . LORazepam (ATIVAN) injection 2 mg  2 mg Intramuscular Q4H PRN Arryana Tolleson T, MD      . magnesium hydroxide (MILK OF MAGNESIA) suspension 30 mL  30 mL Oral Daily PRN Paitynn Mikus T, MD      . metoprolol tartrate (LOPRESSOR) tablet 25 mg  25 mg Oral BID Gladstone Lighter, MD   25 mg at 10/06/17 1740  . midodrine (PROAMATINE) tablet 10 mg  10 mg Oral TID WC [name], MD   10 mg at 10/07/17 1240  . multivitamin (RENA-VIT) tablet 1 tablet  1 tablet Oral QHS [name], [name], MD   1 tablet at 10/06/17 2244  . OLANZapine zydis (ZYPREXA) disintegrating tablet 5 mg  5 mg Oral TID [name], [name], MD   5 mg at 10/08/17 1155  . pantoprazole (PROTONIX) EC tablet 40 mg  40 mg Oral Daily Riyaan Heroux, [name], MD   40 mg at 10/07/17 1242  . pentafluoroprop-tetrafluoroeth (GEBAUERS) aerosol   Topical PRN [name], MD      . timolol (TIMOPTIC) 0.5 % ophthalmic solution 1 drop  1 drop Both Eyes BID Raina Sole, [name], MD   1 drop at 10/08/17 1348  . vitamin C (ASCORBIC ACID) tablet 500 mg  500 mg Oral BID [name], [name], MD   500 mg at 10/06/17 1740    Lab Results:  Results for orders placed or performed during the hospital encounter of 09/17/17 (from the past 48 hour(s))  Glucose, capillary     Status: None   Collection Time: 10/07/17  6:26 AM  Result Value Ref Range   Glucose-Capillary 94 65 - 99 mg/dL  Potassium     Status: None   Collection Time: 10/07/17  7:37 AM  Result Value Ref Range   Potassium  4.2 3.5 - 5.1 mmol/L    Comment: Performed at [facility], [address] [place], [place] [address]    Blood Alcohol level:  Lab Results  Component Value Date   [facility] <10 35/46/5681    Metabolic Disorder Labs: Lab Results  Component Value Date   HGBA1C 4.8 09/18/2017   MPG 91.06 09/18/2017   No results found for: PROLACTIN Lab Results  Component Value Date    CHOL 251 (H) 09/18/2017   TRIG 124 09/18/2017   HDL 66 09/18/2017   CHOLHDL 3.8 09/18/2017   VLDL 25 09/18/2017   LDLCALC 160 (H) 09/18/2017   LDLCALC 43 05/14/2014    Physical Findings: AIMS: Facial and Oral Movements Muscles of Facial Expression: None, normal Lips and Perioral Area: None, normal Jaw: None, normal Tongue: None, normal,Extremity Movements Upper (arms, wrists, hands, fingers): None, normal Lower (legs, knees, ankles, toes): None, normal, Trunk Movements Neck, shoulders, hips: None, normal, Overall Severity Severity of abnormal movements (highest score from questions above): None, normal Incapacitation due to abnormal movements: None, normal Patient's awareness of abnormal movements (rate only patient's report): No Awareness, Dental Status Current problems with teeth and/or dentures?: No Does patient usually wear dentures?: No  CIWA:    COWS:     Musculoskeletal: Strength & Muscle Tone: decreased and atrophy Gait & Station: unsteady Patient leans: N/A  Psychiatric Specialty Exam: Physical Exam  Nursing note and vitals reviewed. Constitutional: She appears well-developed.  HENT:  Head: Normocephalic and atraumatic.  Eyes: Pupils are equal, round, and reactive to light. Conjunctivae are normal.  Neck: Normal range of motion.  Cardiovascular: Regular rhythm and normal heart sounds.  Respiratory: Effort normal. No respiratory distress.  GI: Soft.  Musculoskeletal: Normal range of motion.  Neurological: She is alert.  Skin: Skin is warm and dry.  Psychiatric: Her affect is blunt. Her speech is tangential. She is slowed. Cognition and memory are impaired. She expresses impulsivity and inappropriate judgment. She expresses no homicidal and no suicidal ideation. She exhibits abnormal recent memory and abnormal remote memory.    Review of Systems  Constitutional: Positive for malaise/fatigue.  HENT: Negative.   Eyes: Negative.   Respiratory: Negative.    Cardiovascular: Negative.   Gastrointestinal: Negative.   Musculoskeletal: Negative.   Skin: Negative.   Neurological: Negative.   Psychiatric/Behavioral: Positive for depression and memory loss. Negative for hallucinations, substance abuse and suicidal ideas. The patient is nervous/anxious. The patient does not have insomnia.     Blood pressure (!) 106/48, pulse 75, temperature 98.4 F (36.9 C), temperature source Oral, resp. rate 18, height 5' 3.5" (1.613 m), weight 68.9 kg (151 lb 14.4 oz), SpO2 100 %.Body mass index is 26.49 kg/m.  General Appearance: Disheveled  Eye Contact:  Minimal  Speech:  Slow  Volume:  Decreased  Mood:  Dysphoric and Irritable  Affect:  Depressed  Thought Process:  Disorganized  Orientation:  Negative  Thought Content:  Illogical  Suicidal Thoughts:  No  Homicidal Thoughts:  No  Memory:  Immediate;   Fair Recent;   Poor Remote;   Poor  Judgement:  Poor  Insight:  Lacking  Psychomotor Activity:  Decreased  Concentration:  Concentration: Poor  Recall:  Poor  Fund of Knowledge:  Poor  Language:  Poor  Akathisia:  No  Handed:  Right  AIMS (if indicated):     Assets:  Financial Resources/Insurance Housing Social Support  ADL's:  Impaired  Cognition:  Impaired,  Moderate and Severe  Sleep:  Number  of Hours: 7.45     Treatment Plan Summary: Daily contact with patient to assess and evaluate symptoms and progress in treatment, Medication management and Plan Despite the fact that the patient is still very depressed, withdrawn, hardly eating, confused and cognitively impaired I think she is definitely better than she was at the beginning of this week when she was almost catatonic to the point of death.  She at least cooperated again today with dialysis meaning she has now had a couple of treatments.  No change to medicine.  ECT tomorrow.  I expect she is likely to be here through next week at which time we will continue ECT.  Alethia Berthold,  MD 10/08/2017, 7:12 PM

## 2017-10-08 NOTE — [facility] Group Notes (Signed)
  10/08/2017 9am  Type of Therapy and Topic: Group Therapy: Goals Group: SMART Goals   Participation Level: Did Not Attend  Description of Group:    The purpose of a daily goals group is to assist and guide patients in setting recovery/wellness-related goals. The objective is to set goals as they relate to the crisis in which they were admitted. Patients will be using SMART goal modalities to set measurable goals. Characteristics of realistic goals will be discussed and patients will be assisted in setting and processing how one will reach their goal. Facilitator will also assist patients in applying interventions and coping skills learned in psycho-education groups to the SMART goal and process how one will achieve defined goal.   Therapeutic Goals:   -Patients will develop and document one goal related to or their crisis in which brought them into treatment.  -Patients will be guided by LCSW using SMART goal setting modality in how to set a measurable, attainable, realistic and time sensitive goal.  -Patients will process barriers in reaching goal.  -Patients will process interventions in how to overcome and successful in reaching goal.   Patient's Goal: Patient was encouraged and invited to attend group. Patient did not attend group. Social worker will continue to encourage group participation in the future.    Therapeutic Modalities:  Motivational Interviewing  Cognitive Behavioral Therapy  Crisis Intervention Model  SMART goals setting  Darin Engels, [place] 10/08/2017 9:46 AM

## 2017-10-08 NOTE — Plan of Care (Signed)
  Problem: Education: Goal: Will be free of psychotic symptoms Outcome: Progressing Goal: Knowledge of the prescribed therapeutic regimen will improve Outcome: Progressing   Problem: Coping: Goal: Ability to verbalize feelings will improve Outcome: Progressing   Problem: Safety: Goal: Ability to redirect hostility and anger into socially appropriate behaviors will improve Outcome: Progressing Goal: Ability to remain free from injury will improve Outcome: Progressing   Problem: Physical Regulation: Goal: Complications related to the disease process, condition or treatment will be avoided or minimized Outcome: Progressing   Problem: Coping: Goal: Ability to cope will improve Outcome: Not Progressing   Problem: Health Behavior/Discharge Planning: Goal: Compliance with prescribed medication regimen will improve Outcome: Not Progressing   Problem: Nutritional: Goal: Ability to achieve adequate nutritional intake will improve Outcome: Not Progressing   Problem: Self-Concept: Goal: Ability to verbalize positive feelings about self will improve Outcome: Not Progressing   Problem: Activity: Goal: Ability to tolerate increased activity will improve Outcome: Not Progressing   Problem: Coping: Goal: Development of coping mechanisms to deal with changes in body function or appearance will improve Outcome: Not Progressing   Problem: Fluid Volume: Goal: Compliance with measures to maintain balanced fluid volume will improve Outcome: Not Progressing   Problem: Spiritual Needs Goal: Ability to function at adequate level Outcome: Not Progressing

## 2017-10-08 NOTE — Plan of Care (Signed)
Spoke with psychiatry. Plan for continuation of ECT and dialysis as patient is showing some improvement. Will sign off.   Please reconsult if needed.

## 2017-10-08 NOTE — [facility] Group Notes (Signed)
LCSW Group Therapy Note  10/08/2017 1:00 pm  Type of Therapy/Topic:  Group Therapy:  Balance in Life  Participation Level:  Did Not Attend  Description of Group:    This group will address the concept of balance and how it feels and looks when one is unbalanced. Patients will be encouraged to process areas in their lives that are out of balance and identify reasons for remaining unbalanced. Facilitators will guide patients in utilizing problem-solving interventions to address and correct the stressor making their life unbalanced. Understanding and applying boundaries will be explored and addressed for obtaining and maintaining a balanced life. Patients will be encouraged to explore ways to assertively make their unbalanced needs known to significant others in their lives, using other group members and facilitator for support and feedback.  Therapeutic Goals: 1. Patient will identify two or more emotions or situations they have that consume much of in their lives. 2. Patient will identify signs/triggers that life has become out of balance:  3. Patient will identify two ways to set boundaries in order to achieve balance in their lives:  4. Patient will demonstrate ability to communicate their needs through discussion and/or role plays  Summary of Patient Progress:  Joyce [name] was invited to group, but choose not to attend.    Therapeutic Modalities:   Cognitive Behavioral Therapy Solution-Focused Therapy Assertiveness Training  Devona Konig, [place] 10/08/2017 4:01 PM

## 2017-10-08 NOTE — Progress Notes (Signed)
Patient slept majority of shift.  Around 1400 complain of pain.  When asked would she take something for pain states "probably not, y'all are not doing what you are suppose to"  When asked what are we not doing, states "Y'all are suppose to be making me better but you are not." Explained that she needed to do her part as well.  Patient asked what she needed to do.  Informed that she needed to take her medications, do ect and dialysis. She said that she would and agreed to take her medications.  Prepared patients medications, when informed patient that I had her eye drops, pt. Turned away stating "you may mess up my eyes"  Showed her the bottle of timolol patient agreed to allow drops to be used.  Allowed medications crushed in applesauce to be place in mouth then spit it out and stated that she was not going to take any medications.

## 2017-10-08 NOTE — Plan of Care (Addendum)
Patient slept for Estimated Hours of 7.45; Precautionary checks every 15 minutes for safety maintained, room free of safety hazards, patient sustains no injury or falls during this shift.

## 2017-10-08 NOTE — Progress Notes (Signed)
Patient ID: Joyce [name], female   DOB: 1946/03/17, 73 y.o.   MRN: [id number] Exhausted after coming back form HD, 516 ml Net UF removed per chart review, VSS, sedated. Patient's sister Ms Enid Derry called to check up on patient, requested ADL assist during the day.

## 2017-10-08 NOTE — [facility] Group Notes (Signed)
[place] Group Notes:  (Nursing/MHT/Case Management/Adjunct)  Date:  10/08/2017  Time:  11:08 PM  Type of Therapy:  Group Therapy  Participation Level:  Did Not Attend  [name] 10/08/2017, 11:08 PM

## 2017-10-08 NOTE — Progress Notes (Signed)
Hourly rounding 0800 Resting in bed with eyes close.  1:1 sitter maintainted 0900 Resting in bed with eyes close.  1:1 sitter maintainted 1000 Resting in bed with eyes close.  1:1 sitter maintainted 1100 Resting in bed with eyes close.  1:1 sitter maintainted  1200 Resting in bed with eyes close.  1:1 sitter maintainted 1300 Resting in bed with eyes close.  1:1 sitter maintainted  1400 Restless in be.  1:1 maintained.   1500 Resting in bed with eyes closed.  1:1 maintainted 1600 Resting in bed with eyes closed.  1:1 maintainted 1700 Resting in bed with eyes closed.  1:1 maintainted 1800 Up to dayroom visiting sister.  1:1 maintained 1900 Resting in bed with eyes closed.  1:1 maintainted

## 2017-10-09 ENCOUNTER — Other Ambulatory Visit: Payer: Self-pay | Admitting: Psychiatry

## 2017-10-09 ENCOUNTER — Inpatient Hospital Stay: Payer: Medicare HMO | Admitting: Registered Nurse

## 2017-10-09 DIAGNOSIS — K219 Gastro-esophageal reflux disease without esophagitis: Secondary | ICD-10-CM | POA: Diagnosis not present

## 2017-10-09 DIAGNOSIS — F315 Bipolar disorder, current episode depressed, severe, with psychotic features: Secondary | ICD-10-CM | POA: Diagnosis not present

## 2017-10-09 DIAGNOSIS — N186 End stage renal disease: Secondary | ICD-10-CM | POA: Diagnosis not present

## 2017-10-09 DIAGNOSIS — I120 Hypertensive chronic kidney disease with stage 5 chronic kidney disease or end stage renal disease: Secondary | ICD-10-CM | POA: Diagnosis not present

## 2017-10-09 LAB — POCT I-STAT 4, (NA,K, GLUC, HGB,HCT)
GLUCOSE: 106 mg/dL — AB (ref 65–99)
HCT: 32 % — ABNORMAL LOW (ref 36.0–46.0)
HEMOGLOBIN: 10.9 g/dL — AB (ref 12.0–15.0)
POTASSIUM: 3.7 mmol/L (ref 3.5–5.1)
Sodium: 140 mmol/L (ref 135–145)

## 2017-10-09 LAB — GLUCOSE, CAPILLARY: Glucose-Capillary: 91 mg/dL (ref 65–99)

## 2017-10-09 MED ORDER — SODIUM CHLORIDE 0.9 % IV SOLN
INTRAVENOUS | Status: DC | PRN
  Administered 2017-10-09: 10:00:00 via INTRAVENOUS

## 2017-10-09 MED ORDER — SUCCINYLCHOLINE CHLORIDE 20 MG/ML IJ SOLN
INTRAMUSCULAR | Status: DC | PRN
  Administered 2017-10-09: 80 mg via INTRAVENOUS

## 2017-10-09 MED ORDER — ONDANSETRON HCL 4 MG/2ML IJ SOLN: 4.0000 mg | Freq: Once | INTRAMUSCULAR | Status: DC | PRN

## 2017-10-09 MED ORDER — LORAZEPAM 2 MG/ML IJ SOLN
2.0000 mg | Freq: Four times a day (QID) | INTRAMUSCULAR | Status: DC | PRN
  Administered 2017-10-10 – 2017-10-15 (×3): 2 mg via INTRAMUSCULAR
  Filled 2017-10-09 (×4): qty 1

## 2017-10-09 MED ORDER — SODIUM CHLORIDE 0.9 % IV SOLN
500.0000 mL | Freq: Once | INTRAVENOUS | Status: AC
  Administered 2017-10-09: 500 mL via INTRAVENOUS

## 2017-10-09 MED ORDER — METHOHEXITAL SODIUM 100 MG/10ML IV SOSY
PREFILLED_SYRINGE | INTRAVENOUS | Status: DC | PRN
  Administered 2017-10-09: 70 mg via INTRAVENOUS

## 2017-10-09 MED ORDER — FENTANYL CITRATE (PF) 100 MCG/2ML IJ SOLN: 25.0000 ug | INTRAMUSCULAR | Status: DC | PRN

## 2017-10-09 NOTE — Procedures (Signed)
[facility] Physician's Interval Evaluation & Treatment Note  Patient Identification: Joyce [name] MRN:  [id number] Date of Evaluation:  10/09/2017 TX #: 4  MADRS:   MMSE:   P.E. Findings:  No change to physical exam  Psychiatric Interval Note:  Patient is still flat run down memory impaired little activity or motion  Subjective:  Patient is a 72 y.o. female seen for evaluation for Electroconvulsive Therapy. Tired  Treatment Summary:   []   Right Unilateral             [x]  Bilateral   % Energy : 1.0 ms 50%   Impedance: 2020 ohms  Seizure Energy Index: 9276 V squared  Postictal Suppression Index: 79%  Seizure Concordance Index: 97%  Medications  Pre Shock: Brevital 70 mg succinylcholine 80 mg  Post Shock: None  Seizure Duration: 35 seconds by EMG 49 seconds by EEG   Comments: Patient will not be getting treatment next week because of scheduling.  I assume she is likely to still be in the hospital and we will plan to be continuing on April 1  Lungs:  [x]   Clear to auscultation               []  Other:   Heart:    [x]   Regular rhythm             []  irregular rhythm    [x]   Previous H&P reviewed, patient examined and there are NO CHANGES                 []   Previous H&P reviewed, patient examined and there are changes noted.   Alethia Berthold, MD 3/22/201910:24 AM

## 2017-10-09 NOTE — [facility] Group Notes (Signed)
LCSW Group Therapy Note  10/09/2017 9:30 AM  Type of Therapy and Topic:  Group Therapy:  Feelings around Relapse and Recovery  Participation Level:  Did Not Attend   Description of Group:    Patients in this group will discuss emotions they experience before and after a relapse. They will process how experiencing these feelings, or avoidance of experiencing them, relates to having a relapse. Facilitator will guide patients to explore emotions they have related to recovery. Patients will be encouraged to process which emotions are more powerful. They will be guided to discuss the emotional reaction significant others in their lives may have to their relapse or recovery. Patients will be assisted in exploring ways to respond to the emotions of others without this contributing to a relapse.  Therapeutic Goals: 1. Patient will identify two or more emotions that lead to a relapse for them 2. Patient will identify two emotions that result when they relapse 3. Patient will identify two emotions related to recovery 4. Patient will demonstrate ability to communicate their needs through discussion and/or role plays   Summary of Patient Progress:  Joyce [name] was invited to group, but chose not to attend.   Therapeutic Modalities:   Cognitive Behavioral Therapy Solution-Focused Therapy Assertiveness Training Relapse Prevention Therapy   Devona Konig, LCSW 10/09/2017 2:03 PM

## 2017-10-09 NOTE — Progress Notes (Signed)
Patient noted snoring, fast asleep, no distress noted, will continue to monitor.

## 2017-10-09 NOTE — Plan of Care (Signed)
Patient verbalized understanding of the information provided to her but does not show evidence of the retaining the information.

## 2017-10-09 NOTE — Transfer of Care (Signed)
Immediate Anesthesia Transfer of Care Note  Patient: Joyce [name]  Procedure(s) Performed: ECT TX  Patient Location: PACU  Anesthesia Type:General  Level of Consciousness: awake, alert  and oriented  Airway & Oxygen Therapy: Patient Spontanous Breathing and Patient connected to face mask oxygen  Post-op Assessment: Report given to RN and Post -op Vital signs reviewed and stable  Post vital signs: Reviewed and stable  Last Vitals:  Vitals Value Taken Time  BP 153/98 10/09/2017 10:39 AM  Temp 36.9 C 10/09/2017 10:39 AM  Pulse 94 10/09/2017 10:40 AM  Resp 16 10/09/2017 10:40 AM  SpO2 100 % 10/09/2017 10:40 AM  Vitals shown include unvalidated device data.  Last Pain:  Vitals:   10/09/17 1039  TempSrc: Tympanic  PainSc:          Complications: No apparent anesthesia complications

## 2017-10-09 NOTE — Progress Notes (Signed)
Patient asleep no distress noted, will continue to monitor.

## 2017-10-09 NOTE — Progress Notes (Signed)
[facility]  ROUNDING NOTE   Subjective:    Patient had ECT this AM Lunch at bedside Denies any SOB, Edema   Objective:  Vital signs in last 24 hours:  Temp:  [97.7 F (36.5 C)-98.4 F (36.9 C)] 97.7 F (36.5 C) (03/22 1433) Pulse Rate:  [62-99] 66 (03/22 1433) Resp:  [13-20] 20 (03/22 1433) BP: (126-161)/(51-98) 126/67 (03/22 1433) SpO2:  [96 %-100 %] 98 % (03/22 1418) Weight:  [68.5 kg (151 lb)] 68.5 kg (151 lb) (03/22 0901)  Weight change:  Filed Weights   10/07/17 0949 10/07/17 1515 10/09/17 0901  Weight: 68.9 kg (152 lb) 68.9 kg (151 lb 14.4 oz) 68.5 kg (151 lb)    Intake/Output: I/O last 3 completed shifts: In: 240 [P.O.:240] Out: -    Intake/Output this shift:  Total I/O In: 440 [P.O.:240; I.V.:200] Out: -   Physical Exam: General: NAD, laying in bed  Head: Normocephalic, atraumatic. dry oral mucosal membranes  Eyes: Anicteric  Neck: Supple, trachea midline  Lungs:  Clear to auscultation  Heart: Regular rate and rhythm  Abdomen:  Soft, nontender  Extremities: no peripheral edema.  Psych:  Flat affect. Slow to respond  Neurologic: Nonfocal, moving all four extremities  Skin: No lesions  Access: Left AVF, good bruit    Basic Metabolic Panel: Recent Labs  Lab 10/05/17 0941 10/05/17 1941 10/07/17 0737 10/09/17 0954  NA 141 134*  --  140  K 4.1 3.9 4.2 3.7  CL  --  96*  --   --   CO2  --  22  --   --   GLUCOSE 108* 105*  --  106*  BUN  --  86*  --   --   CREATININE  --  11.38*  --   --   CALCIUM  --  8.9  --   --   PHOS  --  4.8*  --   --     Liver Function Tests: Recent Labs  Lab 10/05/17 1941  ALBUMIN 3.5   No results for input(s): LIPASE, AMYLASE in the last 168 hours. No results for input(s): AMMONIA in the last 168 hours.  CBC: Recent Labs  Lab 10/05/17 0941 10/05/17 1941 10/09/17 0954  WBC  --  11.3*  --   HGB 10.5* 10.8* 10.9*  HCT 31.0* 31.7* 32.0*  MCV  --  94.8  --   PLT  --  364  --     Cardiac  Enzymes: No results for input(s): CKTOTAL, CKMB, CKMBINDEX, TROPONINI in the last 168 hours.  BNP: Invalid input(s): POCBNP  CBG: Recent Labs  Lab 10/02/17 2122 10/05/17 0702 10/07/17 0626 10/09/17 0605  GLUCAP 103* 104* 94 91    Microbiology: Results for orders placed or performed during the hospital encounter of 09/01/17  Urine culture     Status: Abnormal   Collection Time: 09/01/17  4:32 PM  Result Value Ref Range Status   Specimen Description   Final    URINE, RANDOM Performed at [facility], [address], [place], [place] [address]    Special Requests   Final    NONE Performed at [facility], [address] [place], [place] [address]    Culture MULTIPLE SPECIES PRESENT, SUGGEST RECOLLECTION (A)  Final   Report Status 09/03/2017 FINAL  Final  Culture, blood (routine x 2)     Status: None   Collection Time: 09/01/17  6:05 PM  Result Value Ref Range Status   Specimen Description  BLOOD RIGHT HAND  Final   Special Requests   Final    BOTTLES DRAWN AEROBIC AND ANAEROBIC Blood Culture results may not be optimal due to an excessive volume of blood received in culture bottles   Culture   Final    NO GROWTH 5 DAYS Performed at [facility], [address], [place], [place] [address]    Report Status 09/06/2017 FINAL  Final  Culture, blood (routine x 2)     Status: None   Collection Time: 09/01/17  6:34 PM  Result Value Ref Range Status   Specimen Description BLOOD RAC  Final   Special Requests   Final    BOTTLES DRAWN AEROBIC AND ANAEROBIC Blood Culture adequate volume   Culture   Final    NO GROWTH 5 DAYS Performed at [facility], [address], [place], [place] [address]    Report Status 09/06/2017 FINAL  Final    Coagulation Studies: No results for input(s): LABPROT, INR in the last 72 hours.  Urinalysis: No results for input(s): COLORURINE, LABSPEC, PHURINE, GLUCOSEU, HGBUR, BILIRUBINUR, KETONESUR, PROTEINUR,  UROBILINOGEN, NITRITE, LEUKOCYTESUR in the last 72 hours.  Invalid input(s): APPERANCEUR    Imaging: No results found.   Medications:    . brimonidine  1 drop Both Eyes BID  . [START ON 10/17/2017] cyanocobalamin  1,000 mcg Intramuscular Q30 days  . dicyclomine  20 mg Oral TID AC  . epoetin (EPOGEN/PROCRIT) injection  4,000 Units Intravenous Q M,W,F-HD  . feeding supplement (NEPRO CARB STEADY)  237 mL Oral BID BM  . lidocaine-prilocaine  1 application Topical Q M,W,F  . metoprolol tartrate  25 mg Oral BID  . midodrine  10 mg Oral TID WC  . multivitamin  1 tablet Oral QHS  . OLANZapine zydis  5 mg Oral TID  . pantoprazole  40 mg Oral Daily  . timolol  1 drop Both Eyes BID  . vitamin C  500 mg Oral BID   acetaminophen, alum & mag hydroxide-simeth, bisacodyl, fentaNYL (SUBLIMAZE) injection, haloperidol **OR** haloperidol lactate, LORazepam, magnesium hydroxide, ondansetron (ZOFRAN) IV, pentafluoroprop-tetrafluoroeth  Assessment/ Plan:  Joyce [name] is a 72 y.o. white female with end stage renal disease on hemodialysis, diverticulosis, hypotension, bipolar disorder, polycystic kidney disease   [facility]/MWF  1.  ESRD on HD MWF.   HD was planned today but no sitter available after 7 PM therefore we will postpone her treatment to tomorrow  2.  Anemia of CKD: hemoglobin  10.9 -continue epogen with hemodialysis treatments  3.  Secondary hyperparathyroidism:  Not currently on binders.   4. Hypotension:  continue midodrine prior to dialysis.   5. Delirium, bipolar affective disorder, major depression Psychiatry evaluation ongoing Currently patient is undergoing ECT treatments   LOS: [place] 3/22/20194:36 PM

## 2017-10-09 NOTE — Anesthesia Preprocedure Evaluation (Addendum)
Anesthesia Evaluation  Patient identified by MRN, date of birth, ID band Patient awake    Reviewed: Allergy & Precautions, NPO status , Patient's Chart, lab work & pertinent test results, reviewed documented beta blocker date and time   History of Anesthesia Complications (+) Family history of anesthesia reaction  Airway Mallampati: II  TM Distance: >3 FB     Dental  (+) Chipped   Pulmonary           Cardiovascular hypertension, Pt. on medications + Valvular Problems/Murmurs      Neuro/Psych  Headaches, PSYCHIATRIC DISORDERS Bipolar Disorder    GI/Hepatic hiatal hernia, GERD  ,  Endo/Other    Renal/GU Renal disease     Musculoskeletal   Abdominal   Peds  Hematology   Anesthesia Other Findings   Reproductive/Obstetrics                            Anesthesia Physical Anesthesia Plan  ASA: III  Anesthesia Plan: General   Post-op Pain Management:    Induction: Intravenous  PONV Risk Score and Plan:   Airway Management Planned:   Additional Equipment:   Intra-op Plan:   Post-operative Plan:   Informed Consent: I have reviewed the patients History and Physical, chart, labs and discussed the procedure including the risks, benefits and alternatives for the proposed anesthesia with the patient or authorized representative who has indicated his/her understanding and acceptance.     Plan Discussed with: CRNA  Anesthesia Plan Comments:         Anesthesia Quick Evaluation

## 2017-10-09 NOTE — Progress Notes (Signed)
Awake , resting in bed no distress noted, sitter at bedside.

## 2017-10-09 NOTE — Progress Notes (Signed)
.  patient on 1:1, sitter at bedside no distress noted , resting in bed.

## 2017-10-09 NOTE — Progress Notes (Signed)
Sitter at bedside, patient alert and oriented x 3, no distress noted, will continue to monitor.

## 2017-10-09 NOTE — Progress Notes (Signed)
Patient is awake, no distress noted, will continue to monitor.

## 2017-10-09 NOTE — Progress Notes (Signed)
No distress noted resting in bed awake, sitter at bedside.

## 2017-10-09 NOTE — Progress Notes (Signed)
Condition unchanged, sitter at bedside.

## 2017-10-09 NOTE — Progress Notes (Signed)
Patient noted snoring, asleep in bed, no distress noted. ,

## 2017-10-09 NOTE — Anesthesia Post-op Follow-up Note (Signed)
Anesthesia QCDR form completed.        

## 2017-10-09 NOTE — Progress Notes (Signed)
Patient is asleep no distress noted; respiration even non labored, will continue to monitor.

## 2017-10-09 NOTE — Progress Notes (Signed)
Patient on 1:1 no distress noted, sitter at bedside.

## 2017-10-09 NOTE — Anesthesia Postprocedure Evaluation (Signed)
Anesthesia Post Note  Patient: Joyce [name]  Procedure(s) Performed: ECT TX  Patient location during evaluation: PACU Anesthesia Type: General Level of consciousness: awake and alert Pain management: pain level controlled Vital Signs Assessment: post-procedure vital signs reviewed and stable Respiratory status: spontaneous breathing, nonlabored ventilation, respiratory function stable and patient connected to nasal cannula oxygen Cardiovascular status: blood pressure returned to baseline and stable Postop Assessment: no apparent nausea or vomiting Anesthetic complications: no     Last Vitals:  Vitals Value Taken Time  BP    Temp    Pulse    Resp    SpO2      Last Pain:  Vitals:   10/09/17 1109  TempSrc:   PainSc: 0-No pain                 Yancey Pedley S

## 2017-10-09 NOTE — Progress Notes (Signed)
[facility] MD Progress Note  10/09/2017 5:29 PM Joyce [name]  MRN:  [id number] Subjective: Follow-up progress note for 72 year old woman with bipolar disorder currently suffering with what appears to be a psychotic depression episode that verged into catatonia.  She is now being treated with ECT in addition to medication.  Patient had ECT bilateral treatment this morning.  Treatment itself was without any complication and produced an adequate seizure.  Patient seems to be a little more energetic.  Still does not speak much on her own and when she does it is usually confused or irritable but at least is able to answer questions and speak at a normal tone of voice intermittently.  She is eating a little bit more although she requires assistance.  Not reporting acute suicidal ideation.  Does not appear to be hallucinating.  There seem to be some signs that we are seeing some improvement although it is slow.  Patient is beginning to cooperate more with dialysis.  Dialysis was scheduled for today but had to be put off until tomorrow.  Dr. Keturah Barre note reviewed.  I have made sure there are orders in place for PRN medicine for agitation with dialysis. Principal Problem: Bipolar affective disorder, current episode manic with psychotic symptoms ([place]) Diagnosis:   Patient Active Problem List   Diagnosis Date Noted  . Palliative care encounter [Z51.5]   . Bipolar affective disorder, current episode depressed with psychotic symptoms ([place]) [F31.2] 09/17/2017  . Bipolar I disorder, most recent episode (or current) manic ([place]) [F31.10] 09/02/2017  . Acute delirium [R41.0] 09/02/2017  . Altered mental status [R41.82]   . Acute encephalopathy [G93.40] 09/01/2017  . ESRD on dialysis ([place]) [N18.6, Z99.2] 04/06/2017  . Complication of vascular access for dialysis [T82.9XXA] 04/06/2017  . Hematuria [R31.9] 12/18/2016  . Polycystic kidney [Q61.3] 10/12/2016  . Ruptured cyst of kidney [Q61.00] 10/03/2016  . HTN  (hypertension) [I10] 10/01/2016  . Chronic kidney disease [N18.9] 10/01/2016  . Anemia associated with chronic renal failure [D63.1] 09/24/2016  . Acute kidney insufficiency [N28.9] 07/28/2016  . Noninfectious diarrhea [K52.9]   . Benign neoplasm of cecum [D12.0]   . Benign neoplasm of ascending colon [D12.2]   . Diarrhea [R19.7]   . Nausea [R11.0]   . Gastric polyp [K31.7]   . Chronic constipation [K59.09] 12/27/2014  . Nausea with vomiting [R11.2] 12/27/2014  . Left sided abdominal pain [R10.9] 12/27/2014  . Personal history of colonic polyps [Z86.010] 05/10/2013   Total Time spent with patient: 30 minutes  Past Psychiatric History: Long-standing history of mental health problems bipolar disorder with recent severe worsening of her condition after going on dialysis  Past Medical History:  Past Medical History:  Diagnosis Date  . Anal fissure   . Bipolar affective disorder ([place])   . CKD (chronic kidney disease)    Dr Holley Raring [name] 4  . Colon polyps   . Diverticulitis   . Diverticulitis   . Family history of adverse reaction to anesthesia    mom - PONV  . GERD (gastroesophageal reflux disease)   . Headache    migraines - none over 10 yrs  . Heart murmur   . History of hiatal hernia   . Hypertension   . Pancreatitis    Valproic acid  . Vertigo     Past Surgical History:  Procedure Laterality Date  . A/V FISTULAGRAM Left 04/14/2017   Procedure: A/V Fistulagram;  Surgeon: Katha Cabal, MD;  Location: [place] [facility];  Service: Cardiovascular;  Laterality: Left;  . A/V FISTULAGRAM Left 06/09/2017   Procedure: A/V FISTULAGRAM;  Surgeon: Katha Cabal, MD;  Location: [place] [facility];  Service: Cardiovascular;  Laterality: Left;  . ABDOMINAL HYSTERECTOMY  1990 ?  . AV FISTULA PLACEMENT  4/30  . BREAST EXCISIONAL BIOPSY Left 1994   neg surgical bx  . CHOLECYSTECTOMY  2003  . COLONOSCOPY  2014   Dr. Jamal Collin  . COLONOSCOPY WITH PROPOFOL N/A 09/24/2015    Procedure: COLONOSCOPY WITH random colon byopies.;  Surgeon: Lucilla Lame, MD;  Location: [place];  Service: Endoscopy;  Laterality: N/A;  . ESOPHAGOGASTRODUODENOSCOPY (EGD) WITH PROPOFOL N/A 09/24/2015   Procedure: ESOPHAGOGASTRODUODENOSCOPY (EGD) ;  Surgeon: Lucilla Lame, MD;  Location: [place];  Service: Endoscopy;  Laterality: N/A;  . EYE SURGERY    . PERIPHERAL VASCULAR CATHETERIZATION N/A 05/29/2015   Procedure: A/V Shuntogram/Fistulagram;  Surgeon: Katha Cabal, MD;  Location: [place] [facility];  Service: Cardiovascular;  Laterality: N/A;  . PERIPHERAL VASCULAR CATHETERIZATION N/A 05/29/2015   Procedure: A/V Shunt Intervention;  Surgeon: Katha Cabal, MD;  Location: [place] [facility];  Service: Cardiovascular;  Laterality: N/A;  . POLYPECTOMY  09/24/2015   Procedure: POLYPECTOMY INTESTINAL;  Surgeon: Lucilla Lame, MD;  Location: [place];  Service: Endoscopy;;  cecal polyp ascending polyp   Family History:  Family History  Problem Relation Age of Onset  . Stroke Father   . Hypertension Father   . Breast cancer Other   . Colon cancer Neg Hx   . Liver disease Neg Hx    Family Psychiatric  History: Negative Social History:  Social History   Substance and Sexual Activity  Alcohol Use No  . Alcohol/week: 0.0 oz     Social History   Substance and Sexual Activity  Drug Use No    Social History   Socioeconomic History  . Marital status: Single    Spouse name: Not on file  . Number of children: 1  . Years of education: Not on file  . Highest education level: Not on file  Occupational History  . Occupation: Surveyor, quantity: [employer]  Social Needs  . Financial resource strain: Not on file  . Food insecurity:    Worry: Not on file    Inability: Not on file  . Transportation needs:    Medical: Not on file    Non-medical: Not on file  Tobacco Use  . Smoking status: Never Smoker  . Smokeless tobacco: Never Used  Substance  and Sexual Activity  . Alcohol use: No    Alcohol/week: 0.0 oz  . Drug use: No  . Sexual activity: Not Currently  Lifestyle  . Physical activity:    Days per week: Not on file    Minutes per session: Not on file  . Stress: Not on file  Relationships  . Social connections:    Talks on phone: Not on file    Gets together: Not on file    Attends religious service: Not on file    Active member of club or organization: Not on file    Attends meetings of clubs or organizations: Not on file    Relationship status: Not on file  Other Topics Concern  . 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   Additional Social History:    History of alcohol / drug use?: No  history of alcohol / drug abuse                    Sleep: Fair  Appetite:  Poor  Current Medications: Current Facility-Administered Medications  Medication Dose Route Frequency Provider Last Rate Last Dose  . acetaminophen (TYLENOL) tablet 325 mg  325 mg Oral BID PRN Waverley Krempasky, [name], MD   325 mg at 10/09/17 1424  . alum & mag hydroxide-simeth (MAALOX/MYLANTA) 200-200-20 MG/5ML suspension 30 mL  30 mL Oral Q4H PRN Hadlei Stitt T, MD      . bisacodyl (DULCOLAX) EC tablet 5 mg  5 mg Oral Daily PRN Yann Biehn T, MD      . brimonidine (ALPHAGAN) 0.2 % ophthalmic solution 1 drop  1 drop Both Eyes BID Seng Fouts, [name], MD   1 drop at 10/09/17 1702  . [START ON 10/17/2017] cyanocobalamin ((VITAMIN B-12)) injection 1,000 mcg  1,000 mcg Intramuscular Q30 days Wenonah Milo T, MD      . dicyclomine (BENTYL) tablet 20 mg  20 mg Oral TID AC Kamila Broda, [name], MD   20 mg at 10/09/17 1703  . epoetin alfa (EPOGEN,PROCRIT) injection 4,000 Units  4,000 Units Intravenous Q M,W,F-HD [name], MD   4,000 Units at 10/09/17 1204  . feeding supplement (NEPRO CARB STEADY) liquid 237 mL  237 mL Oral BID BM Candiss Norse, [name], MD   237 mL at 10/09/17 1344  .  fentaNYL (SUBLIMAZE) injection 25 mcg  25 mcg Intravenous Q5 min PRN [name], MD      . haloperidol (HALDOL) tablet 5 mg  5 mg Oral Q8H PRN [name], [name], MD   5 mg at 10/06/17 1238   Or  . haloperidol lactate (HALDOL) injection 5 mg  5 mg Intramuscular Q8H PRN [name], [name], MD   5 mg at 10/01/17 0908  . lidocaine-prilocaine (EMLA) cream 1 application  1 application Topical Q M,W,F Iyahna Obriant, [name], MD   1 application at 16/10/96 1219  . LORazepam (ATIVAN) injection 2 mg  2 mg Intramuscular Q4H PRN Fiora Weill T, MD      . LORazepam (ATIVAN) injection 2 mg  2 mg Intramuscular Q6H PRN Richanda Darin T, MD      . magnesium hydroxide (MILK OF MAGNESIA) suspension 30 mL  30 mL Oral Daily PRN Elroy Schembri T, MD      . metoprolol tartrate (LOPRESSOR) tablet 25 mg  25 mg Oral BID Gladstone Lighter, MD   25 mg at 10/09/17 1704  . midodrine (PROAMATINE) tablet 10 mg  10 mg Oral TID WC [name], MD   10 mg at 10/09/17 1706  . multivitamin (RENA-VIT) tablet 1 tablet  1 tablet Oral QHS [name], [name], MD   1 tablet at 10/08/17 2257  . OLANZapine zydis (ZYPREXA) disintegrating tablet 5 mg  5 mg Oral TID [name], [name], MD   5 mg at 10/09/17 1704  . ondansetron (ZOFRAN) injection 4 mg  4 mg Intravenous Once PRN [name], MD      . pantoprazole (PROTONIX) EC tablet 40 mg  40 mg Oral Daily Jakorey Mcconathy, [name], MD   40 mg at 10/09/17 1203  . pentafluoroprop-tetrafluoroeth (GEBAUERS) aerosol   Topical PRN [name], MD      . timolol (TIMOPTIC) 0.5 % ophthalmic solution 1 drop  1 drop Both Eyes BID Nisha Dhami, [name], MD   1 drop at 10/09/17 1703  . vitamin C (ASCORBIC ACID) tablet 500 mg  500 mg Oral BID [name], [name], MD   500 mg at 10/09/17 1704    Lab Results:  Results for orders placed or performed during the hospital encounter of 09/17/17 (from the past 48 hour(s))  Glucose, capillary     Status: None   Collection Time: 10/09/17  6:05 AM  Result  Value Ref Range   Glucose-Capillary 91 65 - 99 mg/dL  I-STAT 4, (NA,K, GLUC, HGB,HCT)     Status: Abnormal   Collection Time: 10/09/17  9:54 AM  Result Value Ref Range   Sodium 140 135 - 145 mmol/L   Potassium 3.7 3.5 - 5.1 mmol/L   Glucose, Bld 106 (H) 65 - 99 mg/dL   HCT 32.0 (L) 36.0 - 46.0 %   Hemoglobin 10.9 (L) 12.0 - 15.0 g/dL    Blood Alcohol level:  Lab Results  Component Value Date   ETH <10 74/25/9563    Metabolic Disorder Labs: Lab Results  Component Value Date   HGBA1C 4.8 09/18/2017   MPG 91.06 09/18/2017   No results found for: PROLACTIN Lab Results  Component Value Date   CHOL 251 (H) 09/18/2017   TRIG 124 09/18/2017   HDL 66 09/18/2017   CHOLHDL 3.8 09/18/2017   VLDL 25 09/18/2017   LDLCALC 160 (H) 09/18/2017   LDLCALC 43 05/14/2014    Physical Findings: AIMS: Facial and Oral Movements Muscles of Facial Expression: None, normal Lips and Perioral Area: None, normal Jaw: None, normal Tongue: None, normal,Extremity Movements Upper (arms, wrists, hands, fingers): None, normal Lower (legs, knees, ankles, toes): None, normal, Trunk Movements Neck, shoulders, hips: None, normal, Overall Severity Severity of abnormal movements (highest score from questions above): None, normal Incapacitation due to abnormal movements: None, normal Patient's awareness of abnormal movements (rate only patient's report): No Awareness, Dental Status Current problems with teeth and/or dentures?: No Does patient usually wear dentures?: No  CIWA:    COWS:     Musculoskeletal: Strength & Muscle Tone: decreased Gait & Station: unsteady Patient leans: N/A  Psychiatric Specialty Exam: Physical Exam  Nursing note and vitals reviewed. Constitutional: She appears well-developed.  HENT:  Head: Normocephalic and atraumatic.  Eyes: Pupils are equal, round, and reactive to light. Conjunctivae are normal.  Neck: Normal range of motion.  Cardiovascular: Regular rhythm and normal  heart sounds.  Respiratory: Effort normal.  GI: Soft.  Musculoskeletal: Normal range of motion.  Neurological: She is alert.  Skin: Skin is warm and dry.  Psychiatric: Her affect is blunt. Her speech is delayed. She is slowed and withdrawn. Thought content is not paranoid. Cognition and memory are impaired. She expresses inappropriate judgment. She expresses no homicidal and no suicidal ideation. She exhibits abnormal recent memory.    Review of Systems  Constitutional: Positive for malaise/fatigue.  Neurological: Positive for focal weakness and weakness.  Psychiatric/Behavioral: Positive for depression and memory loss.    Blood pressure 126/67, pulse 66, temperature 97.7 F (36.5 C), temperature source Oral, resp. rate 20, height 5' 3.5" (1.613 m), weight 68.5 kg (151 lb), SpO2 98 %.Body mass index is 26.33 kg/m.  General Appearance: Disheveled  Eye Contact:  Minimal  Speech:  Slow  Volume:  Decreased  Mood:  Depressed and Dysphoric  Affect:  Constricted  Thought Process:  Disorganized  Orientation:  Other:  General he understands the situation and does not appear to be delirious but she is also somewhat confused  Thought Content:  Rumination  Suicidal Thoughts:  No  Homicidal Thoughts:  No  Memory:  Immediate;   Fair Recent;   Fair Remote;   Fair  Judgement:  Impaired  Insight:  Shallow  Psychomotor Activity:  Decreased  Concentration:  Concentration: Poor  Recall:  Poor  Fund of Knowledge:  Poor  Language:  Fair  Akathisia:  No  Handed:  Right  AIMS (if indicated):     Assets:  Desire for Improvement Financial Resources/Insurance Housing Social Support  ADL's:  Impaired  Cognition:  Impaired,  Mild and Moderate  Sleep:  Number of Hours: 3.3     Treatment Plan Summary: Daily contact with patient to assess and evaluate symptoms and progress in treatment, Medication management and Plan Patient continues to be very withdrawn.  Makes almost no effort to take care of  herself.  Appetite poor.  Requires one-to-one sitter for safety and health.  Patient has shown however some improvement I think with ECT.  We will be planning to continue ECT on April 1 however I will be away from the hospital for the next week.  Patient will be transferred to the care of Dr. Mamie Nick in the meantime.  PRN medicine in place particularly as needed to assist with dialysis treatment.  Alethia Berthold, MD 10/09/2017, 5:29 PM

## 2017-10-09 NOTE — Anesthesia Procedure Notes (Signed)
Procedure Name: General with mask airway Performed by: [name], MD Pre-anesthesia Checklist: Patient identified, Emergency Drugs available, Suction available, Patient being monitored and Timeout performed Patient Re-evaluated:Patient Re-evaluated prior to induction Oxygen Delivery Method: Circle system utilized Preoxygenation: Pre-oxygenation with 100% oxygen Induction Type: IV induction Ventilation: Mask ventilation without difficulty Airway Equipment and Method: Bite block Placement Confirmation: positive ETCO2 Dental Injury: Teeth and Oropharynx as per pre-operative assessment

## 2017-10-09 NOTE — Progress Notes (Signed)
Patient returned to unit after having ECT procedure done. Patient alert and oriented x 3. Ambulatory with one assist. C/O generalized pain with a pain score of a 6 and a goal of a 0. Medicated with Tylenol, food and po fluids provided to patient. Ate a small amount of her meal without difficulty, after meal pt observed resting in bed with eyes closed, VS WNL. Will continue to monitor.

## 2017-10-09 NOTE — Progress Notes (Addendum)
1:1 notes-  0800- Patient in room lying in bed with eyes open. A&O x 3. Communicating with this Probation officer. Sitter at her side. 0900- Patient off of the unit in ECT at this time. 1000- Patient remains at ECT 1100-Patient remains at ECT 1200-Patient returns from ECT, lunch eaten. Sitter at her side 1300- Patient in room resting with her eyes closed, sitter at bedside. 1400-Patient remains resting in bed with eyes closed. Sitter at bedside. C/O generalized pain, Tylenol administered. 1500- Patient in bed resting with eyes closed. No distress noted. Sitter at bedside. 1600- This Probation officer called dialysis nurse regarding the time of treatment for the patient. Dialysis nurse stated that patient would have to come at a later time due to the fact that she was full at the moment. This Probation officer informed dialysis nurse that due to low staffing on the on-coming shift, pt would not be able to have a sitter during treatment after 1900. Dialysis doctor came over to the unit to inform this writer that dialysis would be postponed until tomorrow. 1700- Patient sitting up in her bed eating her dinner tray. Sitter at her side. 1800-Patient ambulated to community dayroom for a visit with her family. Sitter within arms reached. 1900- Patient in her bed resting with her eyes closed. Sitter at bedside. No distress noted.

## 2017-10-09 NOTE — Progress Notes (Signed)
No distress noted sitter at bedside, awake .

## 2017-10-09 NOTE — H&P (Signed)
Joyce [name] is an 72 y.o. female.   Chief Complaint: Patient continues to feel fatigued tired and run down HPI: History of bipolar disorder with severe depression to the point of catatonia especially bad since starting dialysis  Past Medical History:  Diagnosis Date  . Anal fissure   . Bipolar affective disorder ([place])   . CKD (chronic kidney disease)    Dr Holley Raring [name] 4  . Colon polyps   . Diverticulitis   . Diverticulitis   . Family history of adverse reaction to anesthesia    mom - PONV  . GERD (gastroesophageal reflux disease)   . Headache    migraines - none over 10 yrs  . Heart murmur   . History of hiatal hernia   . Hypertension   . Pancreatitis    Valproic acid  . Vertigo     Past Surgical History:  Procedure Laterality Date  . A/V FISTULAGRAM Left 04/14/2017   Procedure: A/V Fistulagram;  Surgeon: Katha Cabal, MD;  Location: [place] [facility];  Service: Cardiovascular;  Laterality: Left;  . A/V FISTULAGRAM Left 06/09/2017   Procedure: A/V FISTULAGRAM;  Surgeon: Katha Cabal, MD;  Location: [place] [facility];  Service: Cardiovascular;  Laterality: Left;  . ABDOMINAL HYSTERECTOMY  1990 ?  . AV FISTULA PLACEMENT  4/30  . BREAST EXCISIONAL BIOPSY Left 1994   neg surgical bx  . CHOLECYSTECTOMY  2003  . COLONOSCOPY  2014   Dr. Jamal Collin  . COLONOSCOPY WITH PROPOFOL N/A 09/24/2015   Procedure: COLONOSCOPY WITH random colon byopies.;  Surgeon: Lucilla Lame, MD;  Location: [place];  Service: Endoscopy;  Laterality: N/A;  . ESOPHAGOGASTRODUODENOSCOPY (EGD) WITH PROPOFOL N/A 09/24/2015   Procedure: ESOPHAGOGASTRODUODENOSCOPY (EGD) ;  Surgeon: Lucilla Lame, MD;  Location: [place];  Service: Endoscopy;  Laterality: N/A;  . EYE SURGERY    . PERIPHERAL VASCULAR CATHETERIZATION N/A 05/29/2015   Procedure: A/V Shuntogram/Fistulagram;  Surgeon: Katha Cabal, MD;  Location: [place] [facility];  Service: Cardiovascular;  Laterality: N/A;   . PERIPHERAL VASCULAR CATHETERIZATION N/A 05/29/2015   Procedure: A/V Shunt Intervention;  Surgeon: Katha Cabal, MD;  Location: [place] [facility];  Service: Cardiovascular;  Laterality: N/A;  . POLYPECTOMY  09/24/2015   Procedure: POLYPECTOMY INTESTINAL;  Surgeon: Lucilla Lame, MD;  Location: [place];  Service: Endoscopy;;  cecal polyp ascending polyp    Family History  Problem Relation Age of Onset  . Stroke Father   . Hypertension Father   . Breast cancer Other   . Colon cancer Neg Hx   . Liver disease Neg Hx    Social History:  reports that she has never smoked. She has never used smokeless tobacco. She reports that she does not drink alcohol or use drugs.  Allergies:  Allergies  Allergen Reactions  . Morphine And Related Shortness Of Breath    Pt reports chest pain and difficulty breathing.  . Indomethacin Hives  . Pollen Extract Other (See Comments)    Sinus problems and HA    Medications Prior to Admission  Medication Sig Dispense Refill  . acetaminophen (TYLENOL) 325 MG tablet Take 325 mg 2 (two) times daily as needed by mouth for moderate pain or headache.     . brimonidine (ALPHAGAN) 0.2 % ophthalmic solution Place 2 (two) times daily into both eyes.    . carbamazepine (TEGRETOL) 200 MG tablet Take 200 mg by mouth 2 (two) times daily.    . cephALEXin (  KEFLEX) 500 MG capsule Take 1 capsule (500 mg total) by mouth every 12 (twelve) hours. 5 capsule 0  . cholecalciferol (VITAMIN D) 1000 UNITS tablet Take 1,000 Units by mouth daily.    . cyanocobalamin (,VITAMIN B-12,) 1000 MCG/ML injection Inject 1,000 mcg into the muscle every 30 (thirty) days.    Marland Kitchen dicyclomine (BENTYL) 20 MG tablet Take 1 tablet (20 mg total) by mouth 3 (three) times daily before meals. 30 tablet 0  . lidocaine-prilocaine (EMLA) cream Apply 1 application every Monday, Wednesday, and Friday topically. At dialysis  3  . midodrine (PROAMATINE) 10 MG tablet Take 10 mg by mouth daily.     .  pantoprazole (PROTONIX) 40 MG tablet TAKE 1 TABLET EVERY DAY 90 tablet 3  . pramipexole (MIRAPEX) 0.125 MG tablet Take 0.125 mg by mouth daily at 2 PM.  3  . QUEtiapine (SEROQUEL) 25 MG tablet Take 50 mg by mouth at bedtime.     . timolol (TIMOPTIC) 0.5 % ophthalmic solution Place 1 drop 2 (two) times daily into both eyes.     . ARIPiprazole (ABILIFY) 2 MG tablet Take 2 mg by mouth daily.    . bisacodyl (DULCOLAX) 5 MG EC tablet Take 1 tablet (5 mg total) by mouth daily as needed for moderate constipation. 30 tablet 0    Results for orders placed or performed during the hospital encounter of 09/17/17 (from the past 48 hour(s))  Glucose, capillary     Status: None   Collection Time: 10/09/17  6:05 AM  Result Value Ref Range   Glucose-Capillary 91 65 - 99 mg/dL   No results found.  Review of Systems  Constitutional: Positive for malaise/fatigue.  HENT: Negative.   Eyes: Negative.   Respiratory: Negative.   Cardiovascular: Negative.   Gastrointestinal: Negative.   Musculoskeletal: Negative.   Skin: Negative.   Neurological: Negative.   Psychiatric/Behavioral: Positive for depression and memory loss. Negative for hallucinations, substance abuse and suicidal ideas. The patient is not nervous/anxious and does not have insomnia.     Blood pressure (!) 129/52, pulse 62, temperature 98.4 F (36.9 C), temperature source Oral, resp. rate 16, height 5' 3.5" (1.613 m), weight 68.5 kg (151 lb), SpO2 100 %. Physical Exam  Nursing note and vitals reviewed. Constitutional: She appears well-developed.  HENT:  Head: Normocephalic and atraumatic.  Eyes: Pupils are equal, round, and reactive to light. Conjunctivae are normal.  Neck: Normal range of motion.  Cardiovascular: Regular rhythm and normal heart sounds.  Respiratory: Effort normal. No respiratory distress.  GI: Soft.  Musculoskeletal: Normal range of motion.  Neurological: She is alert.  Skin: Skin is warm and dry.  Psychiatric: Her  affect is blunt. Her speech is delayed. She is slowed and withdrawn. Thought content is not paranoid. Cognition and memory are impaired. She expresses inappropriate judgment. She expresses no homicidal and no suicidal ideation.     Assessment/Plan Patient appears to be beginning to respond to ECT.  Bilateral treatment today.  Unfortunately there is a break next week follow-up April 1  Alethia Berthold, MD 10/09/2017, 10:22 AM

## 2017-10-10 LAB — BASIC METABOLIC PANEL
Anion gap: 10 (ref 5–15)
BUN: 25 mg/dL — ABNORMAL HIGH (ref 6–20)
CALCIUM: 8.6 mg/dL — AB (ref 8.9–10.3)
CHLORIDE: 101 mmol/L (ref 101–111)
CO2: 29 mmol/L (ref 22–32)
Creatinine, Ser: 4.18 mg/dL — ABNORMAL HIGH (ref 0.44–1.00)
GFR calc Af Amer: 11 mL/min — ABNORMAL LOW (ref >60)
GFR calc non Af Amer: 10 mL/min — ABNORMAL LOW (ref >60)
GLUCOSE: 93 mg/dL (ref 65–99)
Potassium: 3.8 mmol/L (ref 3.5–5.1)
Sodium: 140 mmol/L (ref 135–145)

## 2017-10-10 MED ORDER — METOPROLOL TARTRATE 25 MG PO TABS
12.5000 mg | ORAL_TABLET | Freq: Two times a day (BID) | ORAL | Status: DC
  Administered 2017-10-12 – 2017-10-19 (×7): 12.5 mg via ORAL
  Filled 2017-10-10 (×14): qty 1

## 2017-10-10 NOTE — Plan of Care (Signed)
Patient returned from dialysis treatment alert and confused responding incoherently BP 100/30, pulse  60 . Patient looks weak from procedure, in bed resting , 1:1 seater present , no falls , patient remain safe and secure no distress at [facility] time. Problem: Pain Managment: Goal: General experience of comfort will improve Outcome: Progressing   Problem: Coping: Goal: Ability to cope will improve Outcome: Progressing Goal: Ability to verbalize feelings will improve Outcome: Progressing   Problem: Nutritional: Goal: Ability to achieve adequate nutritional intake will improve Outcome: Progressing   Problem: Safety: Goal: Ability to redirect hostility and anger into socially appropriate behaviors will improve Outcome: Progressing   Problem: Self-Concept: Goal: Ability to verbalize positive feelings about self will improve Outcome: Progressing

## 2017-10-10 NOTE — Progress Notes (Signed)
[facility] MD Progress Note  10/10/2017 9:33 PM Joyce [name]  MRN:  [id number] Subjective: Follow-up progress note for 71 year old woman with bipolar disorder currently suffering with what appears to be a psychotic depression episode that verged into catatonia.  She is now being treated with ECT in addition to medication.  Patient is laying in bed.  She follows me with her eyes and knods that she feels okay.  She is getting dialysis today. Denies pain.    Principal Problem: Bipolar affective disorder, current episode manic with psychotic symptoms ([place]) Diagnosis:   Patient Active Problem List   Diagnosis Date Noted  . Palliative care encounter [Z51.5]   . Bipolar affective disorder, current episode depressed with psychotic symptoms ([place]) [F31.2] 09/17/2017  . Bipolar I disorder, most recent episode (or current) manic ([place]) [F31.10] 09/02/2017  . Acute delirium [R41.0] 09/02/2017  . Altered mental status [R41.82]   . Acute encephalopathy [G93.40] 09/01/2017  . ESRD on dialysis ([place]) [N18.6, Z99.2] 04/06/2017  . Complication of vascular access for dialysis [T82.9XXA] 04/06/2017  . Hematuria [R31.9] 12/18/2016  . Polycystic kidney [Q61.3] 10/12/2016  . Ruptured cyst of kidney [Q61.00] 10/03/2016  . HTN (hypertension) [I10] 10/01/2016  . Chronic kidney disease [N18.9] 10/01/2016  . Anemia associated with chronic renal failure [D63.1] 09/24/2016  . Acute kidney insufficiency [N28.9] 07/28/2016  . Noninfectious diarrhea [K52.9]   . Benign neoplasm of cecum [D12.0]   . Benign neoplasm of ascending colon [D12.2]   . Diarrhea [R19.7]   . Nausea [R11.0]   . Gastric polyp [K31.7]   . Chronic constipation [K59.09] 12/27/2014  . Nausea with vomiting [R11.2] 12/27/2014  . Left sided abdominal pain [R10.9] 12/27/2014  . Personal history of colonic polyps [Z86.010] 05/10/2013   Total Time spent with patient: 20 minutes  Past Psychiatric History: Long-standing history of mental health problems  bipolar disorder with recent severe worsening of her condition after going on dialysis  Past Medical History:  Past Medical History:  Diagnosis Date  . Anal fissure   . Bipolar affective disorder ([place])   . CKD (chronic kidney disease)    Dr Holley Raring [name] 4  . Colon polyps   . Diverticulitis   . Diverticulitis   . Family history of adverse reaction to anesthesia    mom - PONV  . GERD (gastroesophageal reflux disease)   . Headache    migraines - none over 10 yrs  . Heart murmur   . History of hiatal hernia   . Hypertension   . Pancreatitis    Valproic acid  . Vertigo     Past Surgical History:  Procedure Laterality Date  . A/V FISTULAGRAM Left 04/14/2017   Procedure: A/V Fistulagram;  Surgeon: Katha Cabal, MD;  Location: [place] [facility];  Service: Cardiovascular;  Laterality: Left;  . A/V FISTULAGRAM Left 06/09/2017   Procedure: A/V FISTULAGRAM;  Surgeon: Katha Cabal, MD;  Location: [place] [facility];  Service: Cardiovascular;  Laterality: Left;  . ABDOMINAL HYSTERECTOMY  1990 ?  . AV FISTULA PLACEMENT  4/30  . BREAST EXCISIONAL BIOPSY Left 1994   neg surgical bx  . CHOLECYSTECTOMY  2003  . COLONOSCOPY  2014   Dr. Jamal Collin  . COLONOSCOPY WITH PROPOFOL N/A 09/24/2015   Procedure: COLONOSCOPY WITH random colon byopies.;  Surgeon: Lucilla Lame, MD;  Location: [place];  Service: Endoscopy;  Laterality: N/A;  . ESOPHAGOGASTRODUODENOSCOPY (EGD) WITH PROPOFOL N/A 09/24/2015   Procedure: ESOPHAGOGASTRODUODENOSCOPY (EGD) ;  Surgeon: Lucilla Lame, MD;  Location: [place];  Service: Endoscopy;  Laterality: N/A;  . EYE SURGERY    . PERIPHERAL VASCULAR CATHETERIZATION N/A 05/29/2015   Procedure: A/V Shuntogram/Fistulagram;  Surgeon: Katha Cabal, MD;  Location: [place] [facility];  Service: Cardiovascular;  Laterality: N/A;  . PERIPHERAL VASCULAR CATHETERIZATION N/A 05/29/2015   Procedure: A/V Shunt Intervention;  Surgeon: Katha Cabal, MD;   Location: [place] [facility];  Service: Cardiovascular;  Laterality: N/A;  . POLYPECTOMY  09/24/2015   Procedure: POLYPECTOMY INTESTINAL;  Surgeon: Lucilla Lame, MD;  Location: [place];  Service: Endoscopy;;  cecal polyp ascending polyp   Family History:  Family History  Problem Relation Age of Onset  . Stroke Father   . Hypertension Father   . Breast cancer Other   . Colon cancer Neg Hx   . Liver disease Neg Hx    Family Psychiatric  History: Negative Social History:  Social History   Substance and Sexual Activity  Alcohol Use No  . Alcohol/week: 0.0 oz     Social History   Substance and Sexual Activity  Drug Use No    Social History   Socioeconomic History  . Marital status: Single    Spouse name: Not on file  . Number of children: 1  . Years of education: Not on file  . Highest education level: Not on file  Occupational History  . Occupation: Surveyor, quantity: [employer]  Social Needs  . Financial resource strain: Not on file  . Food insecurity:    Worry: Not on file    Inability: Not on file  . Transportation needs:    Medical: Not on file    Non-medical: Not on file  Tobacco Use  . Smoking status: Never Smoker  . Smokeless tobacco: Never Used  Substance and Sexual Activity  . Alcohol use: No    Alcohol/week: 0.0 oz  . Drug use: No  . Sexual activity: Not Currently  Lifestyle  . Physical activity:    Days per week: Not on file    Minutes per session: Not on file  . Stress: Not on file  Relationships  . Social connections:    Talks on phone: Not on file    Gets together: Not on file    Attends religious service: Not on file    Active member of club or organization: Not on file    Attends meetings of clubs or organizations: Not on file    Relationship status: Not on file  Other Topics Concern  . 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    Additional Social History:    History of alcohol / drug use?: No history of alcohol / drug abuse                    Sleep: Fair  Appetite:  Poor  Current Medications: Current Facility-Administered Medications  Medication Dose Route Frequency Provider Last Rate Last Dose  . acetaminophen (TYLENOL) tablet 325 mg  325 mg Oral BID PRN Clapacs, [name], MD   325 mg at 10/09/17 2102  . alum & mag hydroxide-simeth (MAALOX/MYLANTA) 200-200-20 MG/5ML suspension 30 mL  30 mL Oral Q4H PRN Clapacs, [name], MD   30 mL at 10/09/17 2246  . bisacodyl (DULCOLAX) EC tablet 5 mg  5 mg Oral Daily PRN Clapacs, [name], MD      .  brimonidine (ALPHAGAN) 0.2 % ophthalmic solution 1 drop  1 drop Both Eyes BID Clapacs, [name], MD   1 drop at 10/10/17 [phone]  . [START ON 10/17/2017] cyanocobalamin ((VITAMIN B-12)) injection 1,000 mcg  1,000 mcg Intramuscular Q30 days Clapacs, [name], MD      . dicyclomine (BENTYL) tablet 20 mg  20 mg Oral TID AC Clapacs, [name], MD   20 mg at 10/10/17 1202  . epoetin alfa (EPOGEN,PROCRIT) injection 4,000 Units  4,000 Units Intravenous Q M,W,F-HD [name], MD   4,000 Units at 10/10/17 1621  . feeding supplement (NEPRO CARB STEADY) liquid 237 mL  237 mL Oral BID BM Singh, [name], MD   237 mL at 10/10/17 1340  . fentaNYL (SUBLIMAZE) injection 25 mcg  25 mcg Intravenous Q5 min PRN [name], MD      . haloperidol (HALDOL) tablet 5 mg  5 mg Oral Q8H PRN [name], [name], MD   5 mg at 10/06/17 1238   Or  . haloperidol lactate (HALDOL) injection 5 mg  5 mg Intramuscular Q8H PRN [name], [name], MD   5 mg at 10/01/17 0908  . lidocaine-prilocaine (EMLA) cream 1 application  1 application Topical Q M,W,F Clapacs, [name], MD   1 application at 31/49/70 1219  . LORazepam (ATIVAN) injection 2 mg  2 mg Intramuscular Q4H PRN Clapacs, [name], MD      . LORazepam (ATIVAN) injection 2 mg  2 mg Intramuscular Q6H PRN Clapacs, [name], MD   2 mg at 10/10/17 1447  . magnesium  hydroxide (MILK OF MAGNESIA) suspension 30 mL  30 mL Oral Daily PRN Clapacs, [name], MD      . metoprolol tartrate (LOPRESSOR) tablet 12.5 mg  12.5 mg Oral BID Candiss Norse, [name], MD      . midodrine (PROAMATINE) tablet 10 mg  10 mg Oral TID WC [name], MD   10 mg at 10/10/17 1202  . multivitamin (RENA-VIT) tablet 1 tablet  1 tablet Oral QHS [name], [name], MD   1 tablet at 10/09/17 2102  . OLANZapine zydis (ZYPREXA) disintegrating tablet 5 mg  5 mg Oral TID [name], [name], MD   5 mg at 10/10/17 1202  . ondansetron (ZOFRAN) injection 4 mg  4 mg Intravenous Once PRN [name], MD      . pantoprazole (PROTONIX) EC tablet 40 mg  40 mg Oral Daily Clapacs, [name], MD   40 mg at 10/10/17 2637  . pentafluoroprop-tetrafluoroeth (GEBAUERS) aerosol   Topical PRN [name], MD      . timolol (TIMOPTIC) 0.5 % ophthalmic solution 1 drop  1 drop Both Eyes BID Clapacs, [name], MD   1 drop at 10/10/17 [phone]  . vitamin C (ASCORBIC ACID) tablet 500 mg  500 mg Oral BID [name], [name], MD   500 mg at 10/10/17 5027    Lab Results:  Results for orders placed or performed during the hospital encounter of 09/17/17 (from the past 48 hour(s))  Glucose, capillary     Status: None   Collection Time: 10/09/17  6:05 AM  Result Value Ref Range   Glucose-Capillary 91 65 - 99 mg/dL  I-STAT 4, (NA,K, GLUC, HGB,HCT)     Status: Abnormal   Collection Time: 10/09/17  9:54 AM  Result Value Ref Range   Sodium 140 135 - 145 mmol/L   Potassium 3.7 3.5 - 5.1 mmol/L   Glucose, Bld 106 (H) 65 - 99 mg/dL  HCT 32.0 (L) 36.0 - 46.0 %   Hemoglobin 10.9 (L) 12.0 - 15.0 g/dL  Basic metabolic panel     Status: Abnormal   Collection Time: 10/10/17  4:20 PM  Result Value Ref Range   Sodium 140 135 - 145 mmol/L   Potassium 3.8 3.5 - 5.1 mmol/L   Chloride 101 101 - 111 mmol/L   CO2 29 22 - 32 mmol/L   Glucose, Bld 93 65 - 99 mg/dL   BUN 25 (H) 6 - 20 mg/dL   Creatinine, Ser 4.18 (H) 0.44 - 1.00 mg/dL    Calcium 8.6 (L) 8.9 - 10.3 mg/dL   GFR calc non Af Amer 10 (L) >60 mL/min   GFR calc Af Amer 11 (L) >60 mL/min    Comment: (NOTE) The eGFR has been calculated using the CKD EPI equation. This calculation has not been validated in all clinical situations. eGFR's persistently <60 mL/min signify possible Chronic Kidney Disease.    Anion gap 10 5 - 15    Comment: Performed at [facility], [address] [place], [place] [address]    Blood Alcohol level:  Lab Results  Component Value Date   [facility] <10 09/47/0962    Metabolic Disorder Labs: Lab Results  Component Value Date   HGBA1C 4.8 09/18/2017   MPG 91.06 09/18/2017   No results found for: PROLACTIN Lab Results  Component Value Date   CHOL 251 (H) 09/18/2017   TRIG 124 09/18/2017   HDL 66 09/18/2017   CHOLHDL 3.8 09/18/2017   VLDL 25 09/18/2017   LDLCALC 160 (H) 09/18/2017   LDLCALC 43 05/14/2014    Physical Findings: AIMS: Facial and Oral Movements Muscles of Facial Expression: None, normal Lips and Perioral Area: None, normal Jaw: None, normal Tongue: None, normal,Extremity Movements Upper (arms, wrists, hands, fingers): None, normal Lower (legs, knees, ankles, toes): None, normal, Trunk Movements Neck, shoulders, hips: None, normal, Overall Severity Severity of abnormal movements (highest score from questions above): None, normal Incapacitation due to abnormal movements: None, normal Patient's awareness of abnormal movements (rate only patient's report): No Awareness, Dental Status Current problems with teeth and/or dentures?: No Does patient usually wear dentures?: No  CIWA:    COWS:     Musculoskeletal: Strength & Muscle Tone: decreased Gait & Station: unsteady Patient leans: N/A  Psychiatric Specialty Exam: Physical Exam  Nursing note and vitals reviewed. Constitutional: She appears well-developed.  HENT:  Head: Normocephalic and atraumatic.  Eyes: Pupils are equal, round, and reactive  to light. Conjunctivae are normal.  Neck: Normal range of motion.  Cardiovascular: Regular rhythm and normal heart sounds.  Respiratory: Effort normal.  GI: Soft.  Musculoskeletal: Normal range of motion.  Neurological: She is alert.  Skin: Skin is warm and dry.  Psychiatric: Her affect is blunt. Her speech is delayed. She is slowed and withdrawn. Thought content is not paranoid. Cognition and memory are impaired. She expresses inappropriate judgment. She expresses no homicidal and no suicidal ideation. She exhibits abnormal recent memory.    Review of Systems  Constitutional: Positive for malaise/fatigue.  Neurological: Positive for focal weakness and weakness.  Psychiatric/Behavioral: Positive for depression and memory loss.    Blood pressure (!) 109/34, pulse 66, temperature 97.8 F (36.6 C), temperature source Oral, resp. rate (!) 25, height 5' 3.5" (1.613 m), weight 68.5 kg (151 lb), SpO2 100 %.Body mass index is 26.33 kg/m.  General Appearance: Disheveled  Eye Contact:  Minimal  Speech:  Slow  Volume:  Decreased  Mood:  Depressed and Dysphoric  Affect:  Constricted  Thought Process:  Disorganized  Orientation:  Other:  General he understands the situation and does not appear to be delirious but she is also somewhat confused  Thought Content:  Rumination  Suicidal Thoughts:  No  Homicidal Thoughts:  No  Memory:  Immediate;   Fair Recent;   Fair Remote;   Fair  Judgement:  Impaired  Insight:  Shallow  Psychomotor Activity:  Decreased  Concentration:  Concentration: Poor  Recall:  Poor  Fund of Knowledge:  Poor  Language:  Fair  Akathisia:  No  Handed:  Right  AIMS (if indicated):     Assets:  Desire for Improvement Financial Resources/Insurance Housing Social Support  ADL's:  Impaired  Cognition:  Impaired,  Mild and Moderate  Sleep:  Number of Hours: 6     Treatment Plan Summary: Daily contact with patient to assess and evaluate symptoms and progress in  treatment, Medication management and Plan Patient continues to be very withdrawn.  Makes almost no effort to take care of herself.  Appetite poor.  Requires one-to-one sitter for safety and health.  Patient has shown however some improvement I think with ECT.  We will be planning to continue ECT on April 1 however Dr. Weber Cooks will be away from the hospital for the next week.  Patient will be transferred to the care of Dr. Mamie Nick in the meantime (Dr. Vito Berger over the weekend).  PRN medicine in place particularly as needed to assist with dialysis treatment.  Jolene Schimke, MD 10/10/2017, 9:33 PM

## 2017-10-10 NOTE — Plan of Care (Signed)
Pt. Compliant with medications this evening. Pt. Observed eating snack this evening with 1:1 present in the day room. Pt. Able to remain safe while on the unit. Pt. Denies SI/HI.    Problem: Health Behavior/Discharge Planning: Goal: Compliance with prescribed medication regimen will improve Outcome: Progressing   Problem: Nutritional: Goal: Ability to achieve adequate nutritional intake will improve Outcome: Progressing   Problem: Safety: Goal: Ability to remain free from injury will improve Outcome: Progressing

## 2017-10-10 NOTE — Progress Notes (Signed)
Post HD assessment   10/10/17 1913  Vital Signs  Temp 97.8 F (36.6 C)  Temp Source Oral  Pulse Rate 66  Pulse Rate Source Monitor  Resp (!) 25  BP (!) 109/34  BP Location Right Arm  BP Method Automatic  Patient Position (if appropriate) Lying  Oxygen Therapy  SpO2 100 %  O2 Device Room Air  Dialysis Weight  Weight  (unable to weigh pt , no scale available)  Type of Weight Post-Dialysis  Post-Hemodialysis Assessment  Rinseback Volume (mL) 250 mL  KECN 76.6 V  Dialyzer Clearance Lightly streaked  Duration of HD Treatment -hour(s) 3.5 hour(s)  Hemodialysis Intake (mL) 500 mL  UF Total -Machine (mL) 514 mL  Net UF (mL) 14 mL  Tolerated HD Treatment Yes  AVG/AVF Arterial Site Held (minutes) 10 minutes  AVG/AVF Venous Site Held (minutes) 10 minutes  Education / Care Plan  Dialysis Education Provided Yes  Documented Education in Care Plan Yes  Fistula / Graft Left Upper arm Arteriovenous fistula  Placement Date: 08/13/15   Placed prior to admission: Yes  Orientation: Left  Access Location: Upper arm  Access Type: Arteriovenous fistula  Site Condition No complications  Fistula / Graft Assessment Present;Thrill;Bruit  Status Deaccessed  Drainage Description None

## 2017-10-10 NOTE — Progress Notes (Signed)
Post HD assessment. Pt tolerated tx well without c/o or complications. Net UF 14, goal met.    10/10/17 1913  Vital Signs  Temp 97.8 F (36.6 C)  Temp Source Oral  Pulse Rate 66  Pulse Rate Source Monitor  Resp (!) 25  BP (!) 109/34  BP Location Right Arm  BP Method Automatic  Patient Position (if appropriate) Lying  Oxygen Therapy  SpO2 100 %  O2 Device Room Air  Dialysis Weight  Weight  (unable to weigh pt , no scale available)  Type of Weight Post-Dialysis  Post-Hemodialysis Assessment  Rinseback Volume (mL) 250 mL  KECN 76.6 V  Dialyzer Clearance Lightly streaked  Duration of HD Treatment -hour(s) 3.5 hour(s)  Hemodialysis Intake (mL) 500 mL  UF Total -Machine (mL) 514 mL  Net UF (mL) 14 mL  Tolerated HD Treatment Yes  AVG/AVF Arterial Site Held (minutes) 10 minutes  AVG/AVF Venous Site Held (minutes) 10 minutes  Education / Care Plan  Dialysis Education Provided Yes  Documented Education in Care Plan Yes  Fistula / Graft Left Upper arm Arteriovenous fistula  Placement Date: 08/13/15   Placed prior to admission: Yes  Orientation: Left  Access Location: Upper arm  Access Type: Arteriovenous fistula  Site Condition No complications  Fistula / Graft Assessment Present;Thrill;Bruit  Status Deaccessed  Drainage Description None

## 2017-10-10 NOTE — Progress Notes (Signed)
1:1 maintained for patient safety per MD orders   0800: Pt. Sitting on the side of her bed eating breakfast with 1:1 present. 0900:Pt. Observed asleep with 1:1 present 1000: Pt Observed asleep with 1:1 present 1100: Patient observed asleep with 1:1 present 1200: Patient in the bathroom with sitter getting assistance with her personal hygiene 1300: Patient sitting up in her room eating her lunch with the assistance of her sitter.  1400- Patient lying in bed resting with her eyes closed with 1:1 present 1500-Patient transported to dialysis for treatment, sitter at bedside. 1600-Patient in dialysis at this time with sitter at her side. 1700- Patient in dialysis at this time with sitter at her side. 1800-Patient in dialysis at this time with sitter at her side. 1900-Patient in dialysis at this time with sitter at her side/

## 2017-10-10 NOTE — Progress Notes (Signed)
[facility]  ROUNDING NOTE   Subjective:    Patient had ECT on Friday Dialysis postponed to today because no sitter was available  Today, she is doing well Denies any acute c/o Sitter at bedside holding arm   HEMODIALYSIS FLOWSHEET:  Blood Flow Rate (mL/min): 400 mL/min Arterial Pressure (mmHg): -110 mmHg Venous Pressure (mmHg): 260 mmHg Transmembrane Pressure (mmHg): 50 mmHg Ultrafiltration Rate (mL/min): 140 mL/min Dialysate Flow Rate (mL/min): 600 ml/min Conductivity: Machine : 13.5 Conductivity: Machine : 13.5 Dialysis Fluid Bolus: Normal Saline Bolus Amount (mL): 250 mL Dialysate Change: (3k 2.5ca)     Objective:  Vital signs in last 24 hours:  Temp:  [98 F (36.7 C)-98.9 F (37.2 C)] 98 F (36.7 C) (03/23 1510) Pulse Rate:  [49-60] 52 (03/23 1615) Resp:  [13-20] 19 (03/23 1615) BP: (97-115)/(40-62) 97/40 (03/23 1615) SpO2:  [96 %-99 %] 99 % (03/23 1615)  Weight change:  Filed Weights   10/07/17 0949 10/07/17 1515 10/09/17 0901  Weight: 152 lb (68.9 kg) 151 lb 14.4 oz (68.9 kg) 151 lb (68.5 kg)    Intake/Output: I/O last 3 completed shifts: In: 680 [P.O.:480; I.V.:200] Out: -    Intake/Output this shift:  Total I/O In: 840 [P.O.:840] Out: -   Physical Exam: General: NAD, laying in bed  Head: Normocephalic, atraumatic. dry oral mucosal membranes  Eyes: Anicteric  Neck: Supple, trachea midline  Lungs:  Clear to auscultation  Heart: Regular rate and rhythm  Abdomen:  Soft, nontender  Extremities: no peripheral edema.  Psych:  Flat affect. Slow to respond  Neurologic: Nonfocal, moving all four extremities  Skin: No lesions  Access: Left AVF, good bruit    Basic Metabolic Panel: Recent Labs  Lab 10/05/17 0941 10/05/17 1941 10/07/17 0737 10/09/17 0954  NA 141 134*  --  140  K 4.1 3.9 4.2 3.7  CL  --  96*  --   --   CO2  --  22  --   --   GLUCOSE 108* 105*  --  106*  BUN  --  86*  --   --   CREATININE  --  11.38*  --   --    CALCIUM  --  8.9  --   --   PHOS  --  4.8*  --   --     Liver Function Tests: Recent Labs  Lab 10/05/17 1941  ALBUMIN 3.5   No results for input(s): LIPASE, AMYLASE in the last 168 hours. No results for input(s): AMMONIA in the last 168 hours.  CBC: Recent Labs  Lab 10/05/17 0941 10/05/17 1941 10/09/17 0954  WBC  --  11.3*  --   HGB 10.5* 10.8* 10.9*  HCT 31.0* 31.7* 32.0*  MCV  --  94.8  --   PLT  --  364  --     Cardiac Enzymes: No results for input(s): CKTOTAL, CKMB, CKMBINDEX, TROPONINI in the last 168 hours.  BNP: Invalid input(s): POCBNP  CBG: Recent Labs  Lab 10/05/17 0702 10/07/17 0626 10/09/17 0605  GLUCAP 104* 94 91    Microbiology: Results for orders placed or performed during the hospital encounter of 09/01/17  Urine culture     Status: Abnormal   Collection Time: 09/01/17  4:32 PM  Result Value Ref Range Status   Specimen Description   Final    URINE, RANDOM Performed at [facility], [address] [place], [place] [address]    Special Requests   Final  NONE Performed at [facility], [address] [place], [place] [address]    Culture MULTIPLE SPECIES PRESENT, SUGGEST RECOLLECTION (A)  Final   Report Status 09/03/2017 FINAL  Final  Culture, blood (routine x 2)     Status: None   Collection Time: 09/01/17  6:05 PM  Result Value Ref Range Status   Specimen Description BLOOD RIGHT HAND  Final   Special Requests   Final    BOTTLES DRAWN AEROBIC AND ANAEROBIC Blood Culture results may not be optimal due to an excessive volume of blood received in culture bottles   Culture   Final    NO GROWTH 5 DAYS Performed at [facility], [address] [place], [place] [address]    Report Status 09/06/2017 FINAL  Final  Culture, blood (routine x 2)     Status: None   Collection Time: 09/01/17  6:34 PM  Result Value Ref Range Status   Specimen Description BLOOD RAC  Final   Special Requests   Final    BOTTLES  DRAWN AEROBIC AND ANAEROBIC Blood Culture adequate volume   Culture   Final    NO GROWTH 5 DAYS Performed at [facility], [address], [place],  [address]    Report Status 09/06/2017 FINAL  Final    Coagulation Studies: No results for input(s): LABPROT, INR in the last 72 hours.  Urinalysis: No results for input(s): COLORURINE, LABSPEC, PHURINE, GLUCOSEU, HGBUR, BILIRUBINUR, KETONESUR, PROTEINUR, UROBILINOGEN, NITRITE, LEUKOCYTESUR in the last 72 hours.  Invalid input(s): APPERANCEUR    Imaging: No results found.   Medications:    . brimonidine  1 drop Both Eyes BID  . [START ON 10/17/2017] cyanocobalamin  1,000 mcg Intramuscular Q30 days  . dicyclomine  20 mg Oral TID AC  . epoetin (EPOGEN/PROCRIT) injection  4,000 Units Intravenous Q M,W,F-HD  . feeding supplement (NEPRO CARB STEADY)  237 mL Oral BID BM  . lidocaine-prilocaine  1 application Topical Q M,W,F  . metoprolol tartrate  25 mg Oral BID  . midodrine  10 mg Oral TID WC  . multivitamin  1 tablet Oral QHS  . OLANZapine zydis  5 mg Oral TID  . pantoprazole  40 mg Oral Daily  . timolol  1 drop Both Eyes BID  . vitamin C  500 mg Oral BID   acetaminophen, alum & mag hydroxide-simeth, bisacodyl, fentaNYL (SUBLIMAZE) injection, haloperidol **OR** haloperidol lactate, LORazepam, LORazepam, magnesium hydroxide, ondansetron (ZOFRAN) IV, pentafluoroprop-tetrafluoroeth  Assessment/ Plan:  [name] is a 72 y.o. white female with end stage renal disease on hemodialysis, diverticulosis, hypotension, bipolar disorder, polycystic kidney disease   [facility]/MWF  1.  ESRD on HD TTS shifts might work better with ECT schedule Next HD planned for Tuesday  2.  Anemia of CKD: hemoglobin  10.9 -continue epogen with hemodialysis treatments  3.  Secondary hyperparathyroidism:  Not currently on binders.   4. Hypotension:  continue midodrine prior to dialysis.   5. Delirium, bipolar  affective disorder, major depression Psychiatry evaluation ongoing Currently patient is undergoing ECT treatments.  6. Bradycardia With HR in low 50's Will decrease the dose of lopressor to 12.5 BID   LOS: [place] 3/23/20194:41 PM

## 2017-10-10 NOTE — [facility] Group Notes (Signed)
LCSW Group Therapy Note  10/10/2017 1:15pm  Type of Therapy and Topic:  Group Therapy:  Healthy Self Image and Positive Change  Participation Level:  Did Not Attend   Description of Group:  In this group, patients will compare and contrast their current "I am...." statements to the visions they identify as desirable for their lives.  Patients discuss fears and how they can make positive changes in their cognitions that will positively impact their behaviors.  Facilitator played a motivational 3-minute speech and patients were left with the task of thinking about what "I am...." statements they can start using in their lives immediately.  Therapeutic Goals: 1. Patient will state their current self-perception as expressed in an "I Am" statement 2. Patient will contrast this with their desired vision for their live 3. Patient will identify 3 fears that negatively impact their behavior 4. Patient will discuss cognitive distortions that stem from their fears 5. Patient will verbalize statements that challenge their cognitive distortions  Summary of Patient Progress:  The patient was invited to group and did not attend.     Therapeutic Modalities Cognitive Behavioral Therapy Motivational Interviewing  Kastiel Simonian  CUEBAS-COLON, LCSW 10/10/2017 12:25 PM

## 2017-10-10 NOTE — Progress Notes (Signed)
HD tx end   10/10/17 1908  Vital Signs  Pulse Rate 61  Pulse Rate Source Monitor  Resp 15  BP (!) 101/47  BP Location Right Arm  BP Method Automatic  Patient Position (if appropriate) Lying  Oxygen Therapy  SpO2 100 %  O2 Device Room Air  During Hemodialysis Assessment  Dialysis Fluid Bolus Normal Saline  Bolus Amount (mL) 250 mL  Intra-Hemodialysis Comments Tx completed

## 2017-10-10 NOTE — Progress Notes (Signed)
Pt. This evening able to answer orientation questions correctly, A & O x 4. Pt. Observed in the day room with 1:1 present In her wheelchair eating snacks this evening. Pt. During assessment is pleasant and cooperative, but still minimal in her answers. Pt. Denies SI/HI/AVH. Pt. Has no complaints this evening, besides generalized pain and some indigestion that was relieved with PRN medications. Pt. Continues to need assistance with mobility, ADLs, eating, and hygiene, for safety. Pt. Complaint with medications. Pt. Overall mood is pleasant and was happy to see this Probation officer. Pt. Is able to ambulate with staff present occasionally this evening. Pt. Food intake still poor overall. Fistula checked and no complications.    Precautionary checks every 15 minutes for safety maintained, room free of safety hazards, patient sustains no injury or falls during this shift.  1:1 Maintained for pt. Safety per MD orders  1900: Pt. Observed eating snacks with 1:1 present 2000: Pt. Observed asleep with 1:1 present 2100: Pt. Observed in room awake with 1:1 present 2200: Pt. Observed in room awake with 1:1 present 2300: Pt. Observed awake with 1:1 present 0000: Pt. Observed asleep with 1:1 present 0100: Pt. Observed asleep with 1:1 present 0200: Pt. Observed asleep with 1:1 present 0300: Pt. Observed asleep with 1:1 present 0400: Pt. Observed asleep with 1:1 present 0500: Pt. Observed asleep with 1:1 present

## 2017-10-10 NOTE — Progress Notes (Signed)
Pre HD assessment    10/10/17 1510  Vital Signs  Temp 98 F (36.7 C)  Temp Source Oral  Pulse Rate (!) 53  Pulse Rate Source Monitor  Resp 18  BP (!) 113/59  BP Location Right Arm  BP Method Automatic  Patient Position (if appropriate) Lying  Oxygen Therapy  SpO2 97 %  O2 Device Room Air  Pain Assessment  Pain Scale 0-10  Pain Score 0  Dialysis Weight  Weight  (unable to weigh pt, no scale available)  Type of Weight Pre-Dialysis  Time-Out for Hemodialysis  What Procedure? HD  Pt Identifiers(min of two) First/Last Name;MRN/Account#  Correct Site? Yes  Correct Side? Yes  Correct Procedure? Yes  Consents Verified? Yes  Rad Studies Available? N/A  Safety Precautions Reviewed? Yes  Engineer, civil (consulting) Number  ([id number])  Station Number 1  UF/Alarm Test Passed  Conductivity: Meter 13.8  Conductivity: Machine  14.1  pH 7.6  Reverse Osmosis main  Normal Saline Lot Number [id number]  Dialyzer Lot Number [id number]  Disposable Set Lot Number [id number]  Machine Temperature 98.6 F (37 C)  Musician and Audible Yes  Blood Lines Intact and Secured Yes  Pre Treatment Patient Checks  Vascular access used during treatment Fistula  Hepatitis B Surface Antigen Results Negative  Date Hepatitis B Surface Antigen Drawn 09/18/17  Hepatitis B Surface Antibody  (<10)  Date Hepatitis B Surface Antibody Drawn 09/18/17  Hemodialysis Consent Verified Yes  Hemodialysis Standing Orders Initiated Yes  ECG (Telemetry) Monitor On Yes  Prime Ordered Normal Saline  Length of  DialysisTreatment -hour(s) 3.5 Hour(s)  Dialyzer Elisio 17H NR  Dialysate 3K, 2.5 Ca  Dialysis Anticoagulant None  Dialysate Flow Ordered 600  Blood Flow Rate Ordered 400 mL/min  Ultrafiltration Goal 0 Liters  Pre Treatment Labs  (BMP)  Dialysis Blood Pressure Support Ordered Normal Saline  Education / Care Plan  Dialysis Education Provided Yes  Documented Education in Care Plan Yes  Fistula / Graft Left Upper  arm Arteriovenous fistula  Placement Date: 08/13/15   Placed prior to admission: Yes  Orientation: Left  Access Location: Upper arm  Access Type: Arteriovenous fistula  Site Condition No complications  Fistula / Graft Assessment Present;Thrill;Bruit  Drainage Description None

## 2017-10-10 NOTE — Progress Notes (Signed)
Post HD assessment  

## 2017-10-10 NOTE — [facility] Group Notes (Signed)
[place] Group Notes:  (Nursing/MHT/Case Management/Adjunct)  Date:  10/10/2017  Time:  2:49 PM  Type of Therapy:  Psychoeducational Skills  Participation Level:  Did Not Attend  Kathi Ludwig 10/10/2017, 2:49 PM

## 2017-10-10 NOTE — Progress Notes (Signed)
HD tx start   10/10/17 1523  Vital Signs  Pulse Rate (!) 52  Pulse Rate Source Monitor  Resp 16  BP (!) 105/54  BP Location Right Arm  BP Method Automatic  Patient Position (if appropriate) Lying  Oxygen Therapy  SpO2 97 %  O2 Device Room Air  During Hemodialysis Assessment  Blood Flow Rate (mL/min) 400 mL/min  Arterial Pressure (mmHg) -120 mmHg  Venous Pressure (mmHg) 180 mmHg  Transmembrane Pressure (mmHg) 50 mmHg  Ultrafiltration Rate (mL/min) 140 mL/min  Dialysate Flow Rate (mL/min) 600 ml/min  Conductivity: Machine  14  HD Safety Checks Performed Yes  Dialysis Fluid Bolus Normal Saline  Bolus Amount (mL) 250 mL  Intra-Hemodialysis Comments Tx initiated  Fistula / Graft Left Upper arm Arteriovenous fistula  Placement Date: 08/13/15   Placed prior to admission: Yes  Orientation: Left  Access Location: Upper arm  Access Type: Arteriovenous fistula  Status Accessed  Needle Size 15

## 2017-10-10 NOTE — Progress Notes (Signed)
Pre HD assessment   10/10/17 1511  Neurological  Level of Consciousness Alert  Orientation Level Oriented to person;Oriented to situation  Respiratory  Respiratory Pattern Regular;Unlabored  Chest Assessment Chest expansion symmetrical  Cardiac  Pulse Regular  ECG Monitor Yes  Cardiac Rhythm SB  Vascular  R Radial Pulse +1  L Radial Pulse +1  Integumentary  Integumentary (WDL) X  Skin Color Appropriate for ethnicity  Musculoskeletal  Musculoskeletal (WDL) X  Generalized Weakness Yes  Assistive Device None  GU Assessment  Genitourinary (WDL) X  Genitourinary Symptoms  (HD)  Psychosocial  Psychosocial (WDL) X  Patient Behaviors Cooperative;Calm (confused)

## 2017-10-10 NOTE — Progress Notes (Signed)
1:1 Maintained for pt. Safety per MD orders  1900: Pt. Observed eating snacks with 1:1 present 2000: Pt. Observed asleep with 1:1 present 2100: Pt. Observed in room awake with 1:1 present 2200: Pt. Observed in room awake with 1:1 present 2300: Pt. Observed awake with 1:1 present 0000: Pt. Observed asleep with 1:1 present 0100: Pt. Observed asleep with 1:1 present 0200: Pt. Observed asleep with 1:1 present 0300: Pt. Observed asleep with 1:1 present 0400: Pt. Observed asleep with 1:1 present 0500: Pt. Observed asleep with 1:1 present 0600: Pt. Observed asleep with 1:1 present 0700: Pt. Observed asleep with 1:1 present

## 2017-10-11 LAB — COMPREHENSIVE METABOLIC PANEL
ALK PHOS: 84 U/L (ref 38–126)
ALT: 16 U/L (ref 14–54)
AST: 23 U/L (ref 15–41)
Albumin: 3.5 g/dL (ref 3.5–5.0)
Anion gap: 10 (ref 5–15)
BILIRUBIN TOTAL: 0.6 mg/dL (ref 0.3–1.2)
BUN: 26 mg/dL — AB (ref 6–20)
CALCIUM: 9.1 mg/dL (ref 8.9–10.3)
CO2: 32 mmol/L (ref 22–32)
CREATININE: 4.82 mg/dL — AB (ref 0.44–1.00)
Chloride: 95 mmol/L — ABNORMAL LOW (ref 101–111)
GFR calc Af Amer: 10 mL/min — ABNORMAL LOW (ref >60)
GFR, EST NON AFRICAN AMERICAN: 8 mL/min — AB (ref >60)
GLUCOSE: 107 mg/dL — AB (ref 65–99)
POTASSIUM: 4.1 mmol/L (ref 3.5–5.1)
Sodium: 137 mmol/L (ref 135–145)
TOTAL PROTEIN: 6.7 g/dL (ref 6.5–8.1)

## 2017-10-11 LAB — TROPONIN I: Troponin I: 0.03 ng/mL (ref <0.03)

## 2017-10-11 LAB — CKMB (ARMC ONLY): CK, MB: 1.4 ng/mL (ref 0.5–5.0)

## 2017-10-11 NOTE — Progress Notes (Signed)
Patient remains in bed awake. Calm and cooperative. Reports that she had fallen asleep and woke up "over the sudden". No additional concern at present. Staff continue to monitor on 1:1.

## 2017-10-11 NOTE — Progress Notes (Signed)
Patient remains awake, sitter at bedside. Safety maintained.

## 2017-10-11 NOTE — Progress Notes (Signed)
Patient in bed awake. Calm and cooperative. No sign of distress. Safety maintained per sitter at bedside.

## 2017-10-11 NOTE — Progress Notes (Signed)
Pt continue to toss and turn all night. Pt became very confused at one point by turning upside down in bed and leaning head off the bed. Writer had to reposition patient. Pt continues to yell and say help me threw out the night. Pt has slept 1 hour thus far.

## 2017-10-11 NOTE — [facility] Group Notes (Signed)
LCSW Group Therapy Note 10/11/2017 1:15pm  Type of Therapy and Topic: Group Therapy: Feelings Around Returning Home & Establishing a Supportive Framework and Supporting Oneself When Supports Not Available  Participation Level: Did Not Attend  Description of Group:  Patients first processed thoughts and feelings about upcoming discharge. These included fears of upcoming changes, lack of change, new living environments, judgements and expectations from others and overall stigma of mental health issues. The group then discussed the definition of a supportive framework, what that looks and feels like, and how do to discern it from an unhealthy non-supportive network. The group identified different types of supports as well as what to do when your family/friends are less than helpful or unavailable  Therapeutic Goals  1. Patient will identify one healthy supportive network that they can use at discharge. 2. Patient will identify one factor of a supportive framework and how to tell it from an unhealthy network. 3. Patient able to identify one coping skill to use when they do not have positive supports from others. 4. Patient will demonstrate ability to communicate their needs through discussion and/or role plays.  Summary of Patient Progress:   Therapeutic Modalities Cognitive Behavioral Therapy Motivational Interviewing   Lashaya Kienitz  CUEBAS-COLON, LCSW 10/11/2017 12:30 PM

## 2017-10-11 NOTE — Plan of Care (Signed)
Condition unchanged.

## 2017-10-11 NOTE — Progress Notes (Signed)
Patient has remained awake and calm/cooperative. Received her bedtime medication. No respiratory distress, lungs clear. Patient is oriented to person and place. Responding to simple questions and able to turn self. Staff continue to monitor on 1:1.

## 2017-10-11 NOTE — Consult Note (Signed)
Reason for Consult: Chest pain abnormal EKG Referring Physician: Dr Bary Leriche psychiatry  Joyce [name] is an 72 y.o. female.  HPI: Patient's a 72 year old female long history of psychiatric problems including bipolar depression with multiple admissions.  Patient also history of end-stage renal disease on dialysis GERD irregular heartbeat hypertension murmur vertigo.  Patient started complaint of right-sided chest discomfort reproducible with palpation.  Because of the chest wall symptoms patient had EKG which showed T wave inversions anteriorly which appeared not to be ischemic.  Patient had echocardiogram done 2016 which showed preserved left ventricular function EKG on admission had nonspecific findings.  Patient denies any trauma but states that she may have pulled a muscle in the right chest area is tender to palpation and movement denies any fever chills or sweats  Past Medical History:  Diagnosis Date  . Anal fissure   . Bipolar affective disorder ([place])   . CKD (chronic kidney disease)    Dr Holley Raring [name] 4  . Colon polyps   . Diverticulitis   . Diverticulitis   . Family history of adverse reaction to anesthesia    mom - PONV  . GERD (gastroesophageal reflux disease)   . Headache    migraines - none over 10 yrs  . Heart murmur   . History of hiatal hernia   . Hypertension   . Pancreatitis    Valproic acid  . Vertigo     Past Surgical History:  Procedure Laterality Date  . A/V FISTULAGRAM Left 04/14/2017   Procedure: A/V Fistulagram;  Surgeon: Katha Cabal, MD;  Location: [facility];  Service: Cardiovascular;  Laterality: Left;  . A/V FISTULAGRAM Left 06/09/2017   Procedure: A/V FISTULAGRAM;  Surgeon: Katha Cabal, MD;  Location: [facility];  Service: Cardiovascular;  Laterality: Left;  . ABDOMINAL HYSTERECTOMY  1990 ?  . AV FISTULA PLACEMENT  4/30  . BREAST EXCISIONAL BIOPSY Left 1994   neg surgical bx  . CHOLECYSTECTOMY  2003  .  COLONOSCOPY  2014   Dr. Jamal Collin  . COLONOSCOPY WITH PROPOFOL N/A 09/24/2015   Procedure: COLONOSCOPY WITH random colon byopies.;  Surgeon: Lucilla Lame, MD;  Location: [facility];  Service: Endoscopy;  Laterality: N/A;  . ESOPHAGOGASTRODUODENOSCOPY (EGD) WITH PROPOFOL N/A 09/24/2015   Procedure: ESOPHAGOGASTRODUODENOSCOPY (EGD) ;  Surgeon: Lucilla Lame, MD;  Location: [facility];  Service: Endoscopy;  Laterality: N/A;  . EYE SURGERY    . PERIPHERAL VASCULAR CATHETERIZATION N/A 05/29/2015   Procedure: A/V Shuntogram/Fistulagram;  Surgeon: Katha Cabal, MD;  Location: [facility];  Service: Cardiovascular;  Laterality: N/A;  . PERIPHERAL VASCULAR CATHETERIZATION N/A 05/29/2015   Procedure: A/V Shunt Intervention;  Surgeon: Katha Cabal, MD;  Location: [facility];  Service: Cardiovascular;  Laterality: N/A;  . POLYPECTOMY  09/24/2015   Procedure: POLYPECTOMY INTESTINAL;  Surgeon: Lucilla Lame, MD;  Location: [facility];  Service: Endoscopy;;  cecal polyp ascending polyp    Family History  Problem Relation Age of Onset  . Stroke Father   . Hypertension Father   . Breast cancer Other   . Colon cancer Neg Hx   . Liver disease Neg Hx     Social History:  reports that she has never smoked. She has never used smokeless tobacco. She reports that she does not drink alcohol or use drugs.  Allergies:  Allergies  Allergen Reactions  . Morphine And Related Shortness Of Breath    Pt reports chest pain  and difficulty breathing.  . Indomethacin Hives  . Pollen Extract Other (See Comments)    Sinus problems and HA    Medications: I have reviewed the patient's current medications.  Results for orders placed or performed during the hospital encounter of 09/17/17 (from the past 48 hour(s))  Basic metabolic panel     Status: Abnormal   Collection Time: 10/10/17  4:20 PM  Result Value Ref Range   Sodium 140 135 - 145 mmol/L   Potassium 3.8 3.5 - 5.1  mmol/L   Chloride 101 101 - 111 mmol/L   CO2 29 22 - 32 mmol/L   Glucose, Bld 93 65 - 99 mg/dL   BUN 25 (H) 6 - 20 mg/dL   Creatinine, Ser 4.18 (H) 0.44 - 1.00 mg/dL   Calcium 8.6 (L) 8.9 - 10.3 mg/dL   GFR calc non Af Amer 10 (L) >60 mL/min   GFR calc Af Amer 11 (L) >60 mL/min    Comment: (NOTE) The eGFR has been calculated using the CKD EPI equation. This calculation has not been validated in all clinical situations. eGFR's persistently <60 mL/min signify possible Chronic Kidney Disease.    Anion gap 10 5 - 15    Comment: Performed at [facility], [address] [place], [place] [address]  Comprehensive metabolic panel     Status: Abnormal   Collection Time: 10/11/17  3:29 PM  Result Value Ref Range   Sodium 137 135 - 145 mmol/L   Potassium 4.1 3.5 - 5.1 mmol/L   Chloride 95 (L) 101 - 111 mmol/L   CO2 32 22 - 32 mmol/L   Glucose, Bld 107 (H) 65 - 99 mg/dL   BUN 26 (H) 6 - 20 mg/dL   Creatinine, Ser 4.82 (H) 0.44 - 1.00 mg/dL   Calcium 9.1 8.9 - 10.3 mg/dL   Total Protein 6.7 6.5 - 8.1 g/dL   Albumin 3.5 3.5 - 5.0 g/dL   AST 23 15 - 41 U/L   ALT 16 14 - 54 U/L   Alkaline Phosphatase 84 38 - 126 U/L   Total Bilirubin 0.6 0.3 - 1.2 mg/dL   GFR calc non Af Amer 8 (L) >60 mL/min   GFR calc Af Amer 10 (L) >60 mL/min    Comment: (NOTE) The eGFR has been calculated using the CKD EPI equation. This calculation has not been validated in all clinical situations. eGFR's persistently <60 mL/min signify possible Chronic Kidney Disease.    Anion gap 10 5 - 15    Comment: Performed at [facility], [address] [place], [place] [address]  [facility] only)     Status: None   Collection Time: 10/11/17  3:29 PM  Result Value Ref Range   CK, MB 1.4 0.5 - 5.0 ng/mL    Comment: Performed at [facility], [address] [place], [place] [address]  Troponin I     Status: Abnormal   Collection Time: 10/11/17  3:29 PM  Result Value Ref Range   Troponin  I 0.03 (HH) <0.03 ng/mL    Comment: CRITICAL RESULT CALLED TO, READ BACK BY AND VERIFIED WITH  TYAWN CHISEM AT 1605 ON 10/11/17 BY SNJ. Performed at [facility], [address] [place], [place] [address]     No results found.  Review of Systems  Constitutional: Positive for malaise/fatigue.  HENT: Negative.   Eyes: Negative.   Respiratory: Negative.   Cardiovascular: Positive for chest pain and palpitations.  Gastrointestinal: Negative.  Musculoskeletal: Positive for myalgias.  Skin: Negative.   Neurological: Positive for dizziness.  Endo/Heme/Allergies: Negative.   Psychiatric/Behavioral: Positive for depression. The patient is nervous/anxious.    Blood pressure (!) 106/38, pulse (!) 57, temperature 98.4 F (36.9 C), temperature source Oral, resp. rate 16, height 5' 3.5" (1.613 m), weight 151 lb (68.5 kg), SpO2 100 %. Physical Exam  Nursing note and vitals reviewed. Constitutional: She is oriented to person, place, and time. She appears well-developed and well-nourished.  HENT:  Head: Normocephalic and atraumatic.  Eyes: Pupils are equal, round, and reactive to light. Conjunctivae and EOM are normal.  Neck: Normal range of motion. Neck supple.  Cardiovascular: Normal rate and regular rhythm.  Murmur heard. Respiratory: Effort normal and breath sounds normal.  GI: Soft. Bowel sounds are normal.  Musculoskeletal: Normal range of motion.  Neurological: She is alert and oriented to person, place, and time. She has normal reflexes.  Skin: Skin is warm and dry.  Psychiatric: She exhibits a depressed mood.    Assessment/Plan: Chest wall pain Abnormal EKG Borderline troponin Bipolar Irregular heartbeat Hypertension Depression GERD Murmur Vertigo End-stage renal disease . Plan Agree with cardiology evaluation EKG with flipped T waves probably nonischemic Chest wall pain musculoskeletal probably noncardiac Agree with laboratories including troponins and  chemistry Recommend conservative medical therapy Do not recommend further cardiac workup at this point Consider NSAIDs or Tylenol for chest wall discomfort Continue dialysis management Recommend conservative cardiac input at this point  Joyce [name] 10/11/2017, 4:28 PM

## 2017-10-11 NOTE — [facility] Group Notes (Signed)
[place] [facility] Group Notes:  (Nursing/MHT/Case Management/Adjunct)  Date:  10/11/2017  Time:  9:26 PM  Type of Therapy:  Psychoeducational Skills  Participation Level:  Did Not Attend  Joretta Bachelor 10/11/2017, 9:26 PM

## 2017-10-11 NOTE — Progress Notes (Addendum)
0800- Patient in bed resting with eyes closed, sitter at his side. 0900-Patient sitting up on the side of her bed eating her breakfast. Morning medications administered without difficulty. Sitter at bedside. 1000-Patient lying on bed resting with eyes closed. Sitter at her side. 1100-Patient lying in bed with eyes closed. Sitter at her side. 1200-Patient sitting up on the side of bed eating her lunch. Sitter at her side. 1300- Patient lying in bed, complained of medial chest pain that does not radiate. MD made aware, vital signs done. 1400- MD in to assess patient, patient lying in bed, denies SOB. Continues to complain of medial chest pain that does not radiate. New orders put on for labs to be drawn stat and cardiologist to assess pt. Sitter remains at bedside. 1500- Patient in the bathroom bathing with the assistance of safety sitter. No distress noted at this time. 1600- Patient in the dayroom visiting with her sister. Received a critical lab value. Troponin level 0.03, lab value called into Dr. Vito Berger. 1700- Patient in room lying in bed talking to sitter that is at her bedside. No distress noted, denies SOB. Continues to endorse mediall chest "soreness". "But it's only when I move around." 1800- Patient up ambulating in the hallway with sitter. Gait belt on for safety. No distress noted at this time. 1900- Patient lying in bed talking to sitter at her bedside. No distress noted.

## 2017-10-11 NOTE — Plan of Care (Signed)
Patient alert and oriented x 3. Ambulatory with one assist. Food and po fluids provided to patient. Ate a small amount of her meals without difficulty, after meal pt observed resting in bed with eyes closed, VS WNL. Will continue to monitor.

## 2017-10-11 NOTE — Progress Notes (Signed)
Patient in room awake. Calm and cooperative. Sitter remains at bedside.

## 2017-10-11 NOTE — Progress Notes (Addendum)
[facility] MD Progress Note  10/11/2017 2:29 PM Joyce [name]  MRN:  [id number] Subjective: Follow-up progress note for 72 year old woman with bipolar disorder currently suffering with what appears to be a psychotic depression episode that verged into catatonia.  She is now being treated with ECT in addition to medication.  Saw pt this morning, she was sleeping. Later this afternoon pt complaining of chest pain.  Laying in bed, patient upset, states her chest hurts. She is not diaphoretic, no increased work of breathing.  Chest tender to firm palpitation.  Denies chest pain radiating.   Will do w/u   Principal Problem: Bipolar affective disorder, current episode manic with psychotic symptoms ([place]) Diagnosis:   Patient Active Problem List   Diagnosis Date Noted  . Palliative care encounter [Z51.5]   . Bipolar affective disorder, current episode depressed with psychotic symptoms ([place]) [F31.2] 09/17/2017  . Bipolar I disorder, most recent episode (or current) manic ([place]) [F31.10] 09/02/2017  . Acute delirium [R41.0] 09/02/2017  . Altered mental status [R41.82]   . Acute encephalopathy [G93.40] 09/01/2017  . ESRD on dialysis ([place]) [N18.6, Z99.2] 04/06/2017  . Complication of vascular access for dialysis [T82.9XXA] 04/06/2017  . Hematuria [R31.9] 12/18/2016  . Polycystic kidney [Q61.3] 10/12/2016  . Ruptured cyst of kidney [Q61.00] 10/03/2016  . HTN (hypertension) [I10] 10/01/2016  . Chronic kidney disease [N18.9] 10/01/2016  . Anemia associated with chronic renal failure [D63.1] 09/24/2016  . Acute kidney insufficiency [N28.9] 07/28/2016  . Noninfectious diarrhea [K52.9]   . Benign neoplasm of cecum [D12.0]   . Benign neoplasm of ascending colon [D12.2]   . Diarrhea [R19.7]   . Nausea [R11.0]   . Gastric polyp [K31.7]   . Chronic constipation [K59.09] 12/27/2014  . Nausea with vomiting [R11.2] 12/27/2014  . Left sided abdominal pain [R10.9] 12/27/2014  . Personal history of colonic  polyps [Z86.010] 05/10/2013   Total Time spent with patient: 30 minutes  Past Psychiatric History: Long-standing history of mental health problems bipolar disorder with recent severe worsening of her condition after going on dialysis  Past Medical History:  Past Medical History:  Diagnosis Date  . Anal fissure   . Bipolar affective disorder ([place])   . CKD (chronic kidney disease)    Dr Holley Raring [name] 4  . Colon polyps   . Diverticulitis   . Diverticulitis   . Family history of adverse reaction to anesthesia    mom - PONV  . GERD (gastroesophageal reflux disease)   . Headache    migraines - none over 10 yrs  . Heart murmur   . History of hiatal hernia   . Hypertension   . Pancreatitis    Valproic acid  . Vertigo     Past Surgical History:  Procedure Laterality Date  . A/V FISTULAGRAM Left 04/14/2017   Procedure: A/V Fistulagram;  Surgeon: Katha Cabal, MD;  Location: [place] [facility];  Service: Cardiovascular;  Laterality: Left;  . A/V FISTULAGRAM Left 06/09/2017   Procedure: A/V FISTULAGRAM;  Surgeon: Katha Cabal, MD;  Location: [place] [facility];  Service: Cardiovascular;  Laterality: Left;  . ABDOMINAL HYSTERECTOMY  1990 ?  . AV FISTULA PLACEMENT  4/30  . BREAST EXCISIONAL BIOPSY Left 1994   neg surgical bx  . CHOLECYSTECTOMY  2003  . COLONOSCOPY  2014   Dr. Jamal Collin  . COLONOSCOPY WITH PROPOFOL N/A 09/24/2015   Procedure: COLONOSCOPY WITH random colon byopies.;  Surgeon: Lucilla Lame, MD;  Location: [place];  Service: Endoscopy;  Laterality: N/A;  . ESOPHAGOGASTRODUODENOSCOPY (EGD) WITH PROPOFOL N/A 09/24/2015   Procedure: ESOPHAGOGASTRODUODENOSCOPY (EGD) ;  Surgeon: Lucilla Lame, MD;  Location: [place];  Service: Endoscopy;  Laterality: N/A;  . EYE SURGERY    . PERIPHERAL VASCULAR CATHETERIZATION N/A 05/29/2015   Procedure: A/V Shuntogram/Fistulagram;  Surgeon: Katha Cabal, MD;  Location: [place] [facility];  Service:  Cardiovascular;  Laterality: N/A;  . PERIPHERAL VASCULAR CATHETERIZATION N/A 05/29/2015   Procedure: A/V Shunt Intervention;  Surgeon: Katha Cabal, MD;  Location: [place] [facility];  Service: Cardiovascular;  Laterality: N/A;  . POLYPECTOMY  09/24/2015   Procedure: POLYPECTOMY INTESTINAL;  Surgeon: Lucilla Lame, MD;  Location: [place];  Service: Endoscopy;;  cecal polyp ascending polyp   Family History:  Family History  Problem Relation Age of Onset  . Stroke Father   . Hypertension Father   . Breast cancer Other   . Colon cancer Neg Hx   . Liver disease Neg Hx    Family Psychiatric  History: Negative Social History:  Social History   Substance and Sexual Activity  Alcohol Use No  . Alcohol/week: 0.0 oz     Social History   Substance and Sexual Activity  Drug Use No    Social History   Socioeconomic History  . Marital status: Single    Spouse name: Not on file  . Number of children: 1  . Years of education: Not on file  . Highest education level: Not on file  Occupational History  . Occupation: Surveyor, quantity: [employer]  Social Needs  . Financial resource strain: Not on file  . Food insecurity:    Worry: Not on file    Inability: Not on file  . Transportation needs:    Medical: Not on file    Non-medical: Not on file  Tobacco Use  . Smoking status: Never Smoker  . Smokeless tobacco: Never Used  Substance and Sexual Activity  . Alcohol use: No    Alcohol/week: 0.0 oz  . Drug use: No  . Sexual activity: Not Currently  Lifestyle  . Physical activity:    Days per week: Not on file    Minutes per session: Not on file  . Stress: Not on file  Relationships  . Social connections:    Talks on phone: Not on file    Gets together: Not on file    Attends religious service: Not on file    Active member of club or organization: Not on file    Attends meetings of clubs or organizations: Not on file    Relationship status: Not on file  Other  Topics Concern  . 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   Additional Social History:    History of alcohol / drug use?: No history of alcohol / drug abuse                    Sleep: Fair  Appetite:  Poor  Current Medications: Current Facility-Administered Medications  Medication Dose Route Frequency Provider Last Rate Last Dose  . acetaminophen (TYLENOL) tablet 325 mg  325 mg Oral BID PRN Clapacs, [name], MD   325 mg at 10/11/17 1306  . alum & mag hydroxide-simeth (MAALOX/MYLANTA) 200-200-20 MG/5ML suspension 30 mL  30 mL Oral Q4H PRN Clapacs, [name], MD   30 mL at 10/09/17 2246  .  bisacodyl (DULCOLAX) EC tablet 5 mg  5 mg Oral Daily PRN Clapacs, [name], MD      . brimonidine (ALPHAGAN) 0.2 % ophthalmic solution 1 drop  1 drop Both Eyes BID Clapacs, [name], MD   1 drop at 10/11/17 0857  . [START ON 10/17/2017] cyanocobalamin ((VITAMIN B-12)) injection 1,000 mcg  1,000 mcg Intramuscular Q30 days Clapacs, [name], MD      . dicyclomine (BENTYL) tablet 20 mg  20 mg Oral TID AC Clapacs, [name], MD   20 mg at 10/11/17 1151  . epoetin alfa (EPOGEN,PROCRIT) injection 4,000 Units  4,000 Units Intravenous Q M,W,F-HD [name], MD   4,000 Units at 10/10/17 1621  . feeding supplement (NEPRO CARB STEADY) liquid 237 mL  237 mL Oral BID BM [name], MD   237 mL at 10/11/17 0943  . fentaNYL (SUBLIMAZE) injection 25 mcg  25 mcg Intravenous Q5 min PRN [name], MD      . haloperidol (HALDOL) tablet 5 mg  5 mg Oral Q8H PRN [name], [name], MD   5 mg at 10/06/17 1238   Or  . haloperidol lactate (HALDOL) injection 5 mg  5 mg Intramuscular Q8H PRN [name], [name], MD   5 mg at 10/01/17 0908  . lidocaine-prilocaine (EMLA) cream 1 application  1 application Topical Q M,W,F Clapacs, [name], MD   1 application at 05/39/76 1219  . LORazepam (ATIVAN) injection 2 mg  2 mg Intramuscular Q4H  PRN Clapacs, [name], MD      . LORazepam (ATIVAN) injection 2 mg  2 mg Intramuscular Q6H PRN Clapacs, [name], MD   2 mg at 10/10/17 1447  . magnesium hydroxide (MILK OF MAGNESIA) suspension 30 mL  30 mL Oral Daily PRN Clapacs, [name], MD      . metoprolol tartrate (LOPRESSOR) tablet 12.5 mg  12.5 mg Oral BID Candiss Norse, [name], MD      . midodrine (PROAMATINE) tablet 10 mg  10 mg Oral TID WC [name], MD   10 mg at 10/11/17 1152  . multivitamin (RENA-VIT) tablet 1 tablet  1 tablet Oral QHS [name], [name], MD   1 tablet at 10/10/17 2154  . OLANZapine zydis (ZYPREXA) disintegrating tablet 5 mg  5 mg Oral TID [name], [name], MD   5 mg at 10/11/17 1151  . ondansetron (ZOFRAN) injection 4 mg  4 mg Intravenous Once PRN [name], MD      . pantoprazole (PROTONIX) EC tablet 40 mg  40 mg Oral Daily Clapacs, [name], MD   40 mg at 10/11/17 0857  . pentafluoroprop-tetrafluoroeth (GEBAUERS) aerosol   Topical PRN [name], MD      . timolol (TIMOPTIC) 0.5 % ophthalmic solution 1 drop  1 drop Both Eyes BID Clapacs, [name], MD   1 drop at 10/11/17 0857  . vitamin C (ASCORBIC ACID) tablet 500 mg  500 mg Oral BID [name], [name], MD   500 mg at 10/11/17 7341    Lab Results:  Results for orders placed or performed during the hospital encounter of 09/17/17 (from the past 48 hour(s))  Basic metabolic panel     Status: Abnormal   Collection Time: 10/10/17  4:20 PM  Result Value Ref Range   Sodium 140 135 - 145 mmol/L   Potassium 3.8 3.5 - 5.1 mmol/L   Chloride 101 101 - 111 mmol/L   CO2 29 22 - 32 mmol/L   Glucose, Bld 93 65 -  99 mg/dL   BUN 25 (H) 6 - 20 mg/dL   Creatinine, Ser 4.18 (H) 0.44 - 1.00 mg/dL   Calcium 8.6 (L) 8.9 - 10.3 mg/dL   GFR calc non Af Amer 10 (L) >60 mL/min   GFR calc Af Amer 11 (L) >60 mL/min    Comment: (NOTE) The eGFR has been calculated using the CKD EPI equation. This calculation has not been validated in all clinical situations. eGFR's  persistently <60 mL/min signify possible Chronic Kidney Disease.    Anion gap 10 5 - 15    Comment: Performed at [facility], [address] [place], [place] [address]    Blood Alcohol level:  Lab Results  Component Value Date   [facility] <10 82/99/3716    Metabolic Disorder Labs: Lab Results  Component Value Date   HGBA1C 4.8 09/18/2017   MPG 91.06 09/18/2017   No results found for: PROLACTIN Lab Results  Component Value Date   CHOL 251 (H) 09/18/2017   TRIG 124 09/18/2017   HDL 66 09/18/2017   CHOLHDL 3.8 09/18/2017   VLDL 25 09/18/2017   LDLCALC 160 (H) 09/18/2017   LDLCALC 43 05/14/2014    Physical Findings: AIMS: Facial and Oral Movements Muscles of Facial Expression: None, normal Lips and Perioral Area: None, normal Jaw: None, normal Tongue: None, normal,Extremity Movements Upper (arms, wrists, hands, fingers): None, normal Lower (legs, knees, ankles, toes): None, normal, Trunk Movements Neck, shoulders, hips: None, normal, Overall Severity Severity of abnormal movements (highest score from questions above): None, normal Incapacitation due to abnormal movements: None, normal Patient's awareness of abnormal movements (rate only patient's report): No Awareness, Dental Status Current problems with teeth and/or dentures?: No Does patient usually wear dentures?: No  CIWA:    COWS:     Musculoskeletal: Strength & Muscle Tone: decreased Gait & Station: unsteady Patient leans: N/A  Psychiatric Specialty Exam: Physical Exam  Nursing note and vitals reviewed. Constitutional: She appears well-developed.  HENT:  Head: Normocephalic and atraumatic.  Eyes: Pupils are equal, round, and reactive to light. Conjunctivae are normal.  Neck: Normal range of motion.  Cardiovascular: Regular rhythm, S1 normal, S2 normal and normal heart sounds.  C/o of tenderness to palpation in center of chest   Respiratory: Effort normal and breath sounds normal. No tachypnea. No  respiratory distress.  GI: Soft. Normal appearance and normal aorta. There is no tenderness.  Musculoskeletal: Normal range of motion.  Neurological: She is alert.  Skin: Skin is warm and dry.  Psychiatric: Her affect is blunt. Her speech is delayed. She is slowed and withdrawn. Thought content is not paranoid. Cognition and memory are impaired. She expresses inappropriate judgment. She expresses no homicidal and no suicidal ideation. She exhibits abnormal recent memory.    Review of Systems  Constitutional: Positive for malaise/fatigue.  Neurological: Positive for focal weakness and weakness.  Psychiatric/Behavioral: Positive for depression and memory loss.    Blood pressure (!) 106/38, pulse (!) 57, temperature 98.4 F (36.9 C), temperature source Oral, resp. rate 16, height 5' 3.5" (1.613 m), weight 68.5 kg (151 lb), SpO2 100 %.Body mass index is 26.33 kg/m.  General Appearance: Casual  Eye Contact:  Minimal  Speech:  Slow  Volume:  Decreased  Mood:  Depressed and Dysphoric  Affect:  Constricted  Thought Process:  Coherent  Orientation:  Other:  General he understands the situation and does not appear to be delirious but she is also somewhat confused  Thought Content:  Rumination  Suicidal Thoughts:  No  Homicidal Thoughts:  No  Memory:  Immediate;   Fair Recent;   Fair Remote;   Fair  Judgement:  Impaired  Insight:  Shallow  Psychomotor Activity:  Decreased  Concentration:  Concentration: Poor  Recall:  Poor  Fund of Knowledge:  Poor  Language:  Fair  Akathisia:  No  Handed:  Right  AIMS (if indicated):     Assets:  Desire for Improvement Financial Resources/Insurance Housing Social Support  ADL's:  Impaired  Cognition:  Impaired,  Mild and Moderate  Sleep:  Number of Hours: 6.3    ECG shows sinus brady, LAD, ST & T wave abnormality  Treatment Plan Summary:  Ms. Dorinda Stehr is a 72 y.o. white female with bipolard diosrder, also  with end stage renal  disease on hemodialysis, diverticulosis, hypotension, bipolar disorder, polycystic kidney disease   Daily contact with patient to assess and evaluate symptoms and progress in treatment, Medication management and Plan Patient continues to be very withdrawn.  Makes almost no effort to take care of herself.  Appetite poor.  Requires one-to-one sitter for safety and health.  Patient has shown however some improvement I think with ECT.  We will be planning to continue ECT on April 1 however Dr. Weber Cooks will be away from the hospital for the next week.  Patient will be transferred to the care of Dr. Mamie Nick in the meantime (Dr. Vito Berger over the weekend).  PRN medicine in place particularly as needed to assist with dialysis treatment.   #Chest pain C/o of chest pain, most notable thing on exam is pt looing uncomfortable and c/o tenderness to palpation.  Skin warm and dry.  She is hypotensive although this is consistent with her baseline.  Another factor is her dialysis. She got it a day late yesterday. -getting CMP, cardiac enzymes -called cardiology consult they are coming to see pt   #bipolar disorder  -continue olanzapine tid -continue ECT  #gerd -continue prantprazole -continue ondansetron  Continue other meds as above in note       Jolene Schimke, MD 10/11/2017, 2:29 PM

## 2017-10-12 MED ORDER — OLANZAPINE 5 MG PO TBDP
15.0000 mg | ORAL_TABLET | Freq: Every day | ORAL | Status: DC
  Administered 2017-10-12 – 2017-10-21 (×10): 15 mg via ORAL
  Filled 2017-10-12 (×10): qty 3

## 2017-10-12 NOTE — Progress Notes (Signed)
Patient c/o pain on the mid chest.Tylenol given.No SOB or any other distress noted.sitter at bedside for safety.

## 2017-10-12 NOTE — Progress Notes (Signed)
Patient appropriate behavior with 1:1.Safty maintained.

## 2017-10-12 NOTE — Progress Notes (Signed)
Patients affect is brighter and interacting more with staff.Sitter at bedside.

## 2017-10-12 NOTE — Progress Notes (Signed)
Patient resting in bed. Sitter at bedside.

## 2017-10-12 NOTE — Progress Notes (Signed)
10;00 Patient verbalized that pain is less now.Sitter at bedside for safety. 11:00 Safety maintained with sitter. 1200 Patient had lunch at day room.Safety maintained with sitter.Compliant with medications.

## 2017-10-12 NOTE — Progress Notes (Signed)
Patient resting in bed with eyes closed.  Sitter at bedside.

## 2017-10-12 NOTE — [facility] Group Notes (Signed)
LCSW Group Therapy Note   10/12/2017 1:15pm   Type of Therapy and Topic:  Group Therapy:  Overcoming Obstacles   Participation Level:  Did Not Attend   Description of Group:    In this group patients will be encouraged to explore what they see as obstacles to their own wellness and recovery. They will be guided to discuss their thoughts, feelings, and behaviors related to these obstacles. The group will process together ways to cope with barriers, with attention given to specific choices patients can make. Each patient will be challenged to identify changes they are motivated to make in order to overcome their obstacles. This group will be process-oriented, with patients participating in exploration of their own experiences as well as giving and receiving support and challenge from other group members.   Therapeutic Goals: 1. Patient will identify personal and current obstacles as they relate to admission. 2. Patient will identify barriers that currently interfere with their wellness or overcoming obstacles.  3. Patient will identify feelings, thought process and behaviors related to these barriers. 4. Patient will identify two changes they are willing to make to overcome these obstacles:      Summary of Patient Progress      Therapeutic Modalities:   Cognitive Behavioral Therapy Solution Focused Therapy Motivational Interviewing Relapse Prevention Therapy  August Saucer, LCSW 10/12/2017 5:18 PM

## 2017-10-12 NOTE — Progress Notes (Signed)
Patient at day room.Eating dinner & interacting with staff.

## 2017-10-12 NOTE — Progress Notes (Signed)
Patient ate almost everything  from her dinner tray.Patient states "that was so good.".Patient is smiling and interacting with staff.Safty maintained with sitter at bedside.

## 2017-10-12 NOTE — Progress Notes (Signed)
Patient currently in bed awake. No apparent concern. Staff continue to monitor on 1:1.

## 2017-10-12 NOTE — Progress Notes (Signed)
[facility] MD Progress Note  10/12/2017 7:55 PM Joyce [name]  MRN:  [id number]  Subjective:   [name] has much improved. She is talking to me this morning and even arguing. She is upset, complaining of chest pain. She was een by cardiology consultant yesterday and cardiac event was ruled out. She agrees to dialysis but aparently this is not her day. She walks to the dining area without assistance for her meals. Appetite improved, she ate all dinner and enjoyed it. Interacts more with staff.   Treatment plan. We will continue all her medications and dialysis. ECT on holld since Dr. Weber Cooks is out of town. I switched to once daily Zyprexa at night to decrease daytime somnolence and improve sleep.  Social/disposition. TBE. Owns her place, has supportive family and the best psychiatrist.  Principal Problem: Bipolar affective disorder, current episode manic with psychotic symptoms ([place]) Diagnosis:   Patient Active Problem List   Diagnosis Date Noted  . Bipolar affective disorder, current episode depressed with psychotic symptoms ([place]) [F31.2] 09/17/2017    Priority: High  . Palliative care encounter [Z51.5]   . Bipolar I disorder, most recent episode (or current) manic ([place]) [F31.10] 09/02/2017  . Acute delirium [R41.0] 09/02/2017  . Altered mental status [R41.82]   . Acute encephalopathy [G93.40] 09/01/2017  . ESRD on dialysis ([place]) [N18.6, Z99.2] 04/06/2017  . Complication of vascular access for dialysis [T82.9XXA] 04/06/2017  . Hematuria [R31.9] 12/18/2016  . Polycystic kidney [Q61.3] 10/12/2016  . Ruptured cyst of kidney [Q61.00] 10/03/2016  . HTN (hypertension) [I10] 10/01/2016  . Chronic kidney disease [N18.9] 10/01/2016  . Anemia associated with chronic renal failure [D63.1] 09/24/2016  . Acute kidney insufficiency [N28.9] 07/28/2016  . Noninfectious diarrhea [K52.9]   . Benign neoplasm of cecum [D12.0]   . Benign neoplasm of ascending colon [D12.2]   . Diarrhea [R19.7]   .  Nausea [R11.0]   . Gastric polyp [K31.7]   . Chronic constipation [K59.09] 12/27/2014  . Nausea with vomiting [R11.2] 12/27/2014  . Left sided abdominal pain [R10.9] 12/27/2014  . Personal history of colonic polyps [Z86.010] 05/10/2013   Total Time spent with patient: 30 minutes  Past Psychiatric History: bipolar disorder  Past Medical History:  Past Medical History:  Diagnosis Date  . Anal fissure   . Bipolar affective disorder ([place])   . CKD (chronic kidney disease)    Dr Holley Raring [name] 4  . Colon polyps   . Diverticulitis   . Diverticulitis   . Family history of adverse reaction to anesthesia    mom - PONV  . GERD (gastroesophageal reflux disease)   . Headache    migraines - none over 10 yrs  . Heart murmur   . History of hiatal hernia   . Hypertension   . Pancreatitis    Valproic acid  . Vertigo     Past Surgical History:  Procedure Laterality Date  . A/V FISTULAGRAM Left 04/14/2017   Procedure: A/V Fistulagram;  Surgeon: Katha Cabal, MD;  Location: [place] [facility];  Service: Cardiovascular;  Laterality: Left;  . A/V FISTULAGRAM Left 06/09/2017   Procedure: A/V FISTULAGRAM;  Surgeon: Katha Cabal, MD;  Location: [place] [facility];  Service: Cardiovascular;  Laterality: Left;  . ABDOMINAL HYSTERECTOMY  1990 ?  . AV FISTULA PLACEMENT  4/30  . BREAST EXCISIONAL BIOPSY Left 1994   neg surgical bx  . CHOLECYSTECTOMY  2003  . COLONOSCOPY  2014   Dr. Jamal Collin  . COLONOSCOPY WITH PROPOFOL  N/A 09/24/2015   Procedure: COLONOSCOPY WITH random colon byopies.;  Surgeon: Lucilla Lame, MD;  Location: [place];  Service: Endoscopy;  Laterality: N/A;  . ESOPHAGOGASTRODUODENOSCOPY (EGD) WITH PROPOFOL N/A 09/24/2015   Procedure: ESOPHAGOGASTRODUODENOSCOPY (EGD) ;  Surgeon: Lucilla Lame, MD;  Location: [place];  Service: Endoscopy;  Laterality: N/A;  . EYE SURGERY    . PERIPHERAL VASCULAR CATHETERIZATION N/A 05/29/2015   Procedure: A/V  Shuntogram/Fistulagram;  Surgeon: Katha Cabal, MD;  Location: [place] [facility];  Service: Cardiovascular;  Laterality: N/A;  . PERIPHERAL VASCULAR CATHETERIZATION N/A 05/29/2015   Procedure: A/V Shunt Intervention;  Surgeon: Katha Cabal, MD;  Location: [place] [facility];  Service: Cardiovascular;  Laterality: N/A;  . POLYPECTOMY  09/24/2015   Procedure: POLYPECTOMY INTESTINAL;  Surgeon: Lucilla Lame, MD;  Location: [place];  Service: Endoscopy;;  cecal polyp ascending polyp   Family History:  Family History  Problem Relation Age of Onset  . Stroke Father   . Hypertension Father   . Breast cancer Other   . Colon cancer Neg Hx   . Liver disease Neg Hx    Family Psychiatric  History: none Social History:  Social History   Substance and Sexual Activity  Alcohol Use No  . Alcohol/week: 0.0 oz     Social History   Substance and Sexual Activity  Drug Use No    Social History   Socioeconomic History  . Marital status: Single    Spouse name: Not on file  . Number of children: 1  . Years of education: Not on file  . Highest education level: Not on file  Occupational History  . Occupation: Surveyor, quantity: [employer]  Social Needs  . Financial resource strain: Not on file  . Food insecurity:    Worry: Not on file    Inability: Not on file  . Transportation needs:    Medical: Not on file    Non-medical: Not on file  Tobacco Use  . Smoking status: Never Smoker  . Smokeless tobacco: Never Used  Substance and Sexual Activity  . Alcohol use: No    Alcohol/week: 0.0 oz  . Drug use: No  . Sexual activity: Not Currently  Lifestyle  . Physical activity:    Days per week: Not on file    Minutes per session: Not on file  . Stress: Not on file  Relationships  . Social connections:    Talks on phone: Not on file    Gets together: Not on file    Attends religious service: Not on file    Active member of club or organization: Not on file     Attends meetings of clubs or organizations: Not on file    Relationship status: Not on file  Other Topics Concern  . 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   Additional Social History:    History of alcohol / drug use?: No history of alcohol / drug abuse                    Sleep: Poor  Appetite:  Poor  Current Medications: Current Facility-Administered Medications  Medication Dose Route Frequency Provider Last Rate Last Dose  . acetaminophen (TYLENOL) tablet 325 mg  325 mg Oral BID PRN Clapacs, [name], MD   325 mg at 10/12/17 0829  . alum & mag  hydroxide-simeth (MAALOX/MYLANTA) 200-200-20 MG/5ML suspension 30 mL  30 mL Oral Q4H PRN Clapacs, [name], MD   30 mL at 10/09/17 2246  . bisacodyl (DULCOLAX) EC tablet 5 mg  5 mg Oral Daily PRN Clapacs, [name], MD      . brimonidine (ALPHAGAN) 0.2 % ophthalmic solution 1 drop  1 drop Both Eyes BID Clapacs, [name], MD   1 drop at 10/12/17 1649  . [START ON 10/17/2017] cyanocobalamin ((VITAMIN B-12)) injection 1,000 mcg  1,000 mcg Intramuscular Q30 days Clapacs, [name], MD      . dicyclomine (BENTYL) tablet 20 mg  20 mg Oral TID AC Clapacs, [name], MD   20 mg at 10/12/17 1650  . epoetin alfa (EPOGEN,PROCRIT) injection 4,000 Units  4,000 Units Intravenous Q M,W,F-HD [name], MD   4,000 Units at 10/10/17 1621  . feeding supplement (NEPRO CARB STEADY) liquid 237 mL  237 mL Oral BID BM Candiss Norse, [name], MD   237 mL at 10/12/17 1453  . fentaNYL (SUBLIMAZE) injection 25 mcg  25 mcg Intravenous Q5 min PRN [name], MD      . haloperidol (HALDOL) tablet 5 mg  5 mg Oral Q8H PRN Jenette Rayson B, MD   5 mg at 10/06/17 1238   Or  . haloperidol lactate (HALDOL) injection 5 mg  5 mg Intramuscular Q8H PRN Chanc Kervin B, MD   5 mg at 10/01/17 0908  . lidocaine-prilocaine (EMLA) cream 1 application  1 application Topical Q M,W,F Clapacs, [name], MD   1 application at 31/49/70 1219  . LORazepam (ATIVAN) injection 2 mg  2 mg Intramuscular Q4H PRN Clapacs, [name], MD      . LORazepam (ATIVAN) injection 2 mg  2 mg Intramuscular Q6H PRN Clapacs, [name], MD   2 mg at 10/10/17 1447  . magnesium hydroxide (MILK OF MAGNESIA) suspension 30 mL  30 mL Oral Daily PRN Clapacs, [name], MD      . metoprolol tartrate (LOPRESSOR) tablet 12.5 mg  12.5 mg Oral BID [name], MD   12.5 mg at 10/12/17 1652  . midodrine (PROAMATINE) tablet 10 mg  10 mg Oral TID WC [name], MD   10 mg at 10/12/17 1650  . multivitamin (RENA-VIT) tablet 1 tablet  1 tablet Oral QHS Fred Hammes B, MD   1 tablet at 10/11/17 2146  . OLANZapine zydis (ZYPREXA) disintegrating tablet 15 mg  15 mg Oral QHS Jill Stopka B, MD      . ondansetron (ZOFRAN) injection 4 mg  4 mg Intravenous Once PRN [name], MD      . pantoprazole (PROTONIX) EC tablet 40 mg  40 mg Oral Daily Clapacs, [name], MD   40 mg at 10/12/17 0829  . pentafluoroprop-tetrafluoroeth (GEBAUERS) aerosol   Topical PRN [name], MD      . timolol (TIMOPTIC) 0.5 % ophthalmic solution 1 drop  1 drop Both Eyes BID Clapacs, [name], MD   1 drop at 10/12/17 1649  . vitamin C (ASCORBIC ACID) tablet 500 mg  500 mg Oral BID Frederica Chrestman B, MD   500 mg at 10/12/17 1650    Lab Results:  Results for orders placed or performed during the hospital encounter of 09/17/17 (from the past 48 hour(s))  Comprehensive metabolic panel     Status: Abnormal   Collection Time: 10/11/17  3:29 PM  Result Value Ref Range   Sodium 137 135 - 145 mmol/L   Potassium 4.1  3.5 - 5.1 mmol/L   Chloride 95 (L) 101 - 111 mmol/L   CO2 32 22 - 32 mmol/L   Glucose, Bld 107 (H) 65 - 99 mg/dL   BUN 26 (H) 6 - 20 mg/dL   Creatinine, Ser 4.82 (H) 0.44 - 1.00 mg/dL   Calcium 9.1 8.9 - 10.3 mg/dL   Total Protein 6.7 6.5 - 8.1 g/dL   Albumin 3.5 3.5 - 5.0 g/dL   AST 23 15 - 41 U/L   ALT 16 14 - 54 U/L   Alkaline  Phosphatase 84 38 - 126 U/L   Total Bilirubin 0.6 0.3 - 1.2 mg/dL   GFR calc non Af Amer 8 (L) >60 mL/min   GFR calc Af Amer 10 (L) >60 mL/min    Comment: (NOTE) The eGFR has been calculated using the CKD EPI equation. This calculation has not been validated in all clinical situations. eGFR's persistently <60 mL/min signify possible Chronic Kidney Disease.    Anion gap 10 5 - 15    Comment: Performed at [facility], [address] [place], [place] [address]  [facility] only)     Status: None   Collection Time: 10/11/17  3:29 PM  Result Value Ref Range   CK, MB 1.4 0.5 - 5.0 ng/mL    Comment: Performed at [facility], [address] [place], [place] [address]  Troponin I     Status: Abnormal   Collection Time: 10/11/17  3:29 PM  Result Value Ref Range   Troponin I 0.03 (HH) <0.03 ng/mL    Comment: CRITICAL RESULT CALLED TO, READ BACK BY AND VERIFIED WITH  TYAWN CHISEM AT 1605 ON 10/11/17 BY SNJ. Performed at [facility], [address] [place], [place] [address]     Blood Alcohol level:  Lab Results  Component Value Date   [facility] <10 29/47/6546    Metabolic Disorder Labs: Lab Results  Component Value Date   HGBA1C 4.8 09/18/2017   MPG 91.06 09/18/2017   No results found for: PROLACTIN Lab Results  Component Value Date   CHOL 251 (H) 09/18/2017   TRIG 124 09/18/2017   HDL 66 09/18/2017   CHOLHDL 3.8 09/18/2017   VLDL 25 09/18/2017   LDLCALC 160 (H) 09/18/2017   LDLCALC 43 05/14/2014    Physical Findings: AIMS: Facial and Oral Movements Muscles of Facial Expression: None, normal Lips and Perioral Area: None, normal Jaw: None, normal Tongue: None, normal,Extremity Movements Upper (arms, wrists, hands, fingers): None, normal Lower (legs, knees, ankles, toes): None, normal, Trunk Movements Neck, shoulders, hips: None, normal, Overall Severity Severity of abnormal movements (highest score from questions above): None,  normal Incapacitation due to abnormal movements: None, normal Patient's awareness of abnormal movements (rate only patient's report): No Awareness, Dental Status Current problems with teeth and/or dentures?: No Does patient usually wear dentures?: No  CIWA:    COWS:     Musculoskeletal: Strength & Muscle Tone: within normal limits Gait & Station: normal Patient leans: N/A  Psychiatric Specialty Exam: Physical Exam  Nursing note and vitals reviewed. Psychiatric: Her speech is normal. Judgment normal. She is slowed. Thought content is paranoid. Cognition and memory are normal. She exhibits a depressed mood.    Review of Systems  Neurological: Negative.   Psychiatric/Behavioral: Positive for depression.  All other systems reviewed and are negative.   Blood pressure (!) 118/49, pulse 76, temperature 97.6 F (36.4 C), temperature source Oral, resp. rate 16, height 5' 3.5" (1.613 m), weight 68.5  kg (151 lb), SpO2 97 %.Body mass index is 26.33 kg/m.  General Appearance: Disheveled  Eye Contact:  Good  Speech:  Clear and Coherent  Volume:  Increased  Mood:  Dysphoric  Affect:  Congruent  Thought Process:  Goal Directed and Descriptions of Associations: Intact  Orientation:  Full (Time, Place, and Person)  Thought Content:  Paranoid Ideation  Suicidal Thoughts:  No  Homicidal Thoughts:  No  Memory:  Immediate;   Fair Recent;   Fair Remote;   Fair  Judgement:  Impaired  Insight:  Shallow  Psychomotor Activity:  Psychomotor Retardation  Concentration:  Concentration: Fair and Attention Span: Fair  Recall:  [employer] of Knowledge:  Fair  Language:  Fair  Akathisia:  No  Handed:  Right  AIMS (if indicated):     Assets:  Communication Skills Desire for Improvement Financial Resources/Insurance Housing Resilience Social Support  ADL's:  Intact  Cognition:  WNL  Sleep:  Number of Hours: 8     Treatment Plan Summary: Daily contact with patient to assess and evaluate  symptoms and progress in treatment and Medication management   JoyceSuzi Hernan [name] a 72 y.o.white female with bipolar diosrder and end stage renal disease on hemodialysis admitted for mixed episode that turned into catatonic depression who responded favorably to initial ECT treatment.   #bipolar disorder  -continue Zyprexa 15 mg nightly -resume ECT on 4/1 if necessary  #Chest pain -cardiology consult is greatly appreciated  -no acute even -continue Tylenol for chest wall pain  #gerd -continue prantprazole -continue ondansetron  #ESRD -continue dialysis per nephrology consult  #Disposition -sister has HCPOA -discharge to home with family -follow up with Dr. Bryna Colander, MD 10/12/2017, 7:55 PM

## 2017-10-12 NOTE — Tx Team (Signed)
Interdisciplinary Treatment and Diagnostic Plan Update  10/12/2017 Time of Session: 10:450am Joyce [name] MRN: [id number]  Principal Diagnosis: Bipolar affective disorder, current episode manic with psychotic symptoms ([place])  Secondary Diagnoses: Principal Problem:   Bipolar affective disorder, current episode depressed with psychotic symptoms ([place]) Active Problems:   ESRD on dialysis [facility])   Palliative care encounter   Current Medications:  Current Facility-Administered Medications  Medication Dose Route Frequency Provider Last Rate Last Dose  . acetaminophen (TYLENOL) tablet 325 mg  325 mg Oral BID PRN Clapacs, [name], MD   325 mg at 10/12/17 0829  . alum & mag hydroxide-simeth (MAALOX/MYLANTA) 200-200-20 MG/5ML suspension 30 mL  30 mL Oral Q4H PRN Clapacs, [name], MD   30 mL at 10/09/17 2246  . bisacodyl (DULCOLAX) EC tablet 5 mg  5 mg Oral Daily PRN Clapacs, [name], MD      . brimonidine (ALPHAGAN) 0.2 % ophthalmic solution 1 drop  1 drop Both Eyes BID Clapacs, [name], MD   1 drop at 10/12/17 1649  . [START ON 10/17/2017] cyanocobalamin ((VITAMIN B-12)) injection 1,000 mcg  1,000 mcg Intramuscular Q30 days Clapacs, [name], MD      . dicyclomine (BENTYL) tablet 20 mg  20 mg Oral TID AC Clapacs, [name], MD   20 mg at 10/12/17 1650  . epoetin alfa (EPOGEN,PROCRIT) injection 4,000 Units  4,000 Units Intravenous Q M,W,F-HD [name], MD   4,000 Units at 10/10/17 1621  . feeding supplement (NEPRO CARB STEADY) liquid 237 mL  237 mL Oral BID BM Candiss Norse, [name], MD   237 mL at 10/12/17 1453  . fentaNYL (SUBLIMAZE) injection 25 mcg  25 mcg Intravenous Q5 min PRN [name], MD      . haloperidol (HALDOL) tablet 5 mg  5 mg Oral Q8H PRN [name], [name], MD   5 mg at 10/06/17 1238   Or  . haloperidol lactate (HALDOL) injection 5 mg  5 mg Intramuscular Q8H PRN [name], [name], MD   5 mg at 10/01/17 0908  . lidocaine-prilocaine (EMLA) cream 1 application  1 application  Topical Q M,W,F Clapacs, [name], MD   1 application at 99/24/26 1219  . LORazepam (ATIVAN) injection 2 mg  2 mg Intramuscular Q4H PRN Clapacs, [name], MD      . LORazepam (ATIVAN) injection 2 mg  2 mg Intramuscular Q6H PRN Clapacs, [name], MD   2 mg at 10/10/17 1447  . magnesium hydroxide (MILK OF MAGNESIA) suspension 30 mL  30 mL Oral Daily PRN Clapacs, [name], MD      . metoprolol tartrate (LOPRESSOR) tablet 12.5 mg  12.5 mg Oral BID [name], MD   12.5 mg at 10/12/17 1652  . midodrine (PROAMATINE) tablet 10 mg  10 mg Oral TID WC [name], MD   10 mg at 10/12/17 1650  . multivitamin (RENA-VIT) tablet 1 tablet  1 tablet Oral QHS [name], [name], MD   1 tablet at 10/11/17 2146  . OLANZapine zydis (ZYPREXA) disintegrating tablet 15 mg  15 mg Oral QHS [name], [name], MD      . ondansetron (ZOFRAN) injection 4 mg  4 mg Intravenous Once PRN [name], MD      . pantoprazole (PROTONIX) EC tablet 40 mg  40 mg Oral Daily Clapacs, [name], MD   40 mg at 10/12/17 0829  . pentafluoroprop-tetrafluoroeth (GEBAUERS) aerosol   Topical PRN [name], MD      . timolol (  TIMOPTIC) 0.5 % ophthalmic solution 1 drop  1 drop Both Eyes BID Clapacs, [name], MD   1 drop at 10/12/17 1649  . vitamin C (ASCORBIC ACID) tablet 500 mg  500 mg Oral BID [name], [name], MD   500 mg at 10/12/17 1650   PTA Medications: Medications Prior to Admission  Medication Sig Dispense Refill Last Dose  . acetaminophen (TYLENOL) 325 MG tablet Take 325 mg 2 (two) times daily as needed by mouth for moderate pain or headache.    10/06/2017 at Unknown time  . brimonidine (ALPHAGAN) 0.2 % ophthalmic solution Place 2 (two) times daily into both eyes.   10/06/2017 at Unknown time  . carbamazepine (TEGRETOL) 200 MG tablet Take 200 mg by mouth 2 (two) times daily.   10/06/2017 at Unknown time  . cephALEXin (KEFLEX) 500 MG capsule Take 1 capsule (500 mg total) by mouth every 12 (twelve) hours. 5 capsule 0  10/06/2017 at Unknown time  . cholecalciferol (VITAMIN D) 1000 UNITS tablet Take 1,000 Units by mouth daily.   10/06/2017 at Unknown time  . cyanocobalamin (,VITAMIN B-12,) 1000 MCG/ML injection Inject 1,000 mcg into the muscle every 30 (thirty) days.   10/06/2017 at Unknown time  . dicyclomine (BENTYL) 20 MG tablet Take 1 tablet (20 mg total) by mouth 3 (three) times daily before meals. 30 tablet 0 10/06/2017 at Unknown time  . lidocaine-prilocaine (EMLA) cream Apply 1 application every Monday, Wednesday, and Friday topically. At dialysis  3 10/06/2017 at Unknown time  . midodrine (PROAMATINE) 10 MG tablet Take 10 mg by mouth daily.    10/06/2017 at Unknown time  . pantoprazole (PROTONIX) 40 MG tablet TAKE 1 TABLET EVERY DAY 90 tablet 3 10/06/2017 at Unknown time  . pramipexole (MIRAPEX) 0.125 MG tablet Take 0.125 mg by mouth daily at 2 PM.  3 10/06/2017 at Unknown time  . QUEtiapine (SEROQUEL) 25 MG tablet Take 50 mg by mouth at bedtime.    10/01/2017  . timolol (TIMOPTIC) 0.5 % ophthalmic solution Place 1 drop 2 (two) times daily into both eyes.    10/06/2017 at Unknown time  . ARIPiprazole (ABILIFY) 2 MG tablet Take 2 mg by mouth daily.    at Unknown time  . bisacodyl (DULCOLAX) 5 MG EC tablet Take 1 tablet (5 mg total) by mouth daily as needed for moderate constipation. 30 tablet 0  at Unknown time    Patient Stressors: Health problems  Patient Strengths: Ability for insight Active sense of humor Average or above average intelligence Capable of independent living Occupational psychologist fund of knowledge Supportive family/friends  Treatment Modalities: Medication Management, Group therapy, Case management,  1 to 1 session with clinician, Psychoeducation, Recreational therapy.   Physician Treatment Plan for Primary Diagnosis: Bipolar affective disorder, current episode manic with psychotic symptoms ([place]) Long Term Goal(s): Improvement in symptoms so as ready for  discharge NA   Short Term Goals: Ability to identify changes in lifestyle to reduce recurrence of condition will improve Ability to verbalize feelings will improve Ability to disclose and discuss suicidal ideas Ability to demonstrate self-control will improve Ability to identify and develop effective coping behaviors will improve Ability to identify triggers associated with substance abuse/mental health issues will improve NA  Medication Management: Evaluate patient's response, side effects, and tolerance of medication regimen.  Therapeutic Interventions: 1 to 1 sessions, Unit Group sessions and Medication administration.  Evaluation of Outcomes: Progressing  Physician Treatment Plan for Secondary Diagnosis: Principal Problem:   Bipolar  affective disorder, current episode depressed with psychotic symptoms ([place]) Active Problems:   ESRD on dialysis [facility])   Palliative care encounter  Long Term Goal(s): Improvement in symptoms so as ready for discharge NA   Short Term Goals: Ability to identify changes in lifestyle to reduce recurrence of condition will improve Ability to verbalize feelings will improve Ability to disclose and discuss suicidal ideas Ability to demonstrate self-control will improve Ability to identify and develop effective coping behaviors will improve Ability to identify triggers associated with substance abuse/mental health issues will improve NA     Medication Management: Evaluate patient's response, side effects, and tolerance of medication regimen.  Therapeutic Interventions: 1 to 1 sessions, Unit Group sessions and Medication administration.  Evaluation of Outcomes: Progressing   RN Treatment Plan for Primary Diagnosis: Bipolar affective disorder, current episode manic with psychotic symptoms ([place]) Long Term Goal(s): Knowledge of disease and therapeutic regimen to maintain health will improve  Short Term Goals: Ability to demonstrate self-control and  Compliance with prescribed medications will improve  Medication Management: RN will administer medications as ordered by provider, will assess and evaluate patient's response and provide education to patient for prescribed medication. RN will report any adverse and/or side effects to prescribing provider.  Therapeutic Interventions: 1 on 1 counseling sessions, Psychoeducation, Medication administration, Evaluate responses to treatment, Monitor vital signs and CBGs as ordered, Perform/monitor CIWA, COWS, AIMS and Fall Risk screenings as ordered, Perform wound care treatments as ordered.  Evaluation of Outcomes: Progressing   LCSW Treatment Plan for Primary Diagnosis: Bipolar affective disorder, current episode manic with psychotic symptoms ([place]) Long Term Goal(s): Safe transition to appropriate next level of care at discharge, Engage patient in therapeutic group addressing interpersonal concerns.  Short Term Goals: Engage patient in aftercare planning with referrals and resources, Increase social support and Increase skills for wellness and recovery  Therapeutic Interventions: Assess for all discharge needs, 1 to 1 time with Social worker, Explore available resources and support systems, Assess for adequacy in community support network, Educate family and significant other(s) on suicide prevention, Complete Psychosocial Assessment, Interpersonal group therapy.  Evaluation of Outcomes: Not Progressing   Progress in Treatment: Attending groups: No. Participating in groups: No. Taking medication as prescribed: Yes. Toleration medication: Yes. Family/Significant other contact made: No, will contact:  Patient refused family contact Patient understands diagnosis: Yes. Discussing patient identified problems/goals with staff: Yes. Medical problems stabilized or resolved: Yes. Denies suicidal/homicidal ideation: Yes. Issues/concerns per patient self-inventory: No. Other:   New problem(s)  identified: No, Describe:  None  New Short Term/Long Term Goal(s): "To be able to focus and trust a person and not doubt my doctor."  Discharge Plan or Barriers: To return home or to a assisted living facility and follow up with establish provider.   Reason for Continuation of Hospitalization: Medical Issues Medication stabilization Pt receiving ECT treatments.  It is my understanding that treatments will pause for this week and resume next week when Dr. Weber Cooks returns.  Estimated Length of Stay: 5-7 days  Attendees: Patient:  10/12/2017 5:15 PM  Physician: Dr. Orson Slick, MD 10/12/2017 5:15 PM  Nursing:  10/12/2017 5:15 PM  RN Care Manager:  10/12/2017 5:15 PM  Social Worker: Dossie Arbour, LCSW 10/12/2017 5:15 PM  Recreational Therapist:  10/12/2017 5:15 PM  Other:  10/12/2017 5:15 PM  Other:  10/12/2017 5:15 PM  Other: 10/12/2017 5:15 PM    Scribe for Treatment Team: August Saucer, LCSW 10/12/2017 5:15 PM

## 2017-10-12 NOTE — Progress Notes (Signed)
Patient walk up to day room for breakfast with minimal assistance.Ate about 10%.

## 2017-10-12 NOTE — Progress Notes (Signed)
01:00 Patient currently in bed awake. No apparent concern. Staff continue to monitor on 1:1.   02:00 Patient currently in bed awake. No apparent concern. Staff continue to monitor on 1:1.   03:00 Patient currently in bed awake. No apparent concern. Staff continue to monitor on 1:1.   04:00 Patient currently in bed awake. No apparent concern. Staff continue to monitor on 1:1.   05:00Patient currently in bed awake. No apparent concern. Staff continue to monitor on 1:1.   05:55 Patient currently in bed awake. No apparent concern. Staff continue to monitor on 1:1.

## 2017-10-13 LAB — PHOSPHORUS: Phosphorus: 3.5 mg/dL (ref 2.5–4.6)

## 2017-10-13 NOTE — Progress Notes (Signed)
Patient is alert  X 3 , denies pain , sitter at bedside.

## 2017-10-13 NOTE — Progress Notes (Signed)
Condition unchanged no distress noted.

## 2017-10-13 NOTE — Progress Notes (Signed)
Pre HD assessment   10/13/17 2011  Vital Signs  Temp 98 F (36.7 C)  Temp Source Oral  Pulse Rate 61  Pulse Rate Source Monitor  Resp 18  BP (!) 118/51  BP Location Right Arm  BP Method Automatic  Patient Position (if appropriate) Sitting  Oxygen Therapy  SpO2 100 %  O2 Device Room Air  Pain Assessment  Pain Scale 0-10  Pain Score 0  Dialysis Weight  Weight  (unable to weigh pt, in chair, no scale available)  Type of Weight Pre-Dialysis  Time-Out for Hemodialysis  What Procedure? HD  Pt Identifiers(min of two) First/Last Name;MRN/Account#  Correct Site? Yes  Correct Side? Yes  Correct Procedure? Yes  Consents Verified? Yes  Rad Studies Available? N/A  Safety Precautions Reviewed? Yes  Engineer, civil (consulting) Number  ([id number])  Station Number 1  UF/Alarm Test Passed  Conductivity: Meter 13.8  Conductivity: Machine  14.1  pH 7.6  Reverse Osmosis main  Normal Saline Lot Number [id number]  Dialyzer Lot Number [id number]  Disposable Set Lot Number [id number]  Machine Temperature 98.6 F (37 C)  Musician and Audible Yes  Blood Lines Intact and Secured Yes  Pre Treatment Patient Checks  Vascular access used during treatment Fistula  Hepatitis B Surface Antigen Results Negative  Date Hepatitis B Surface Antigen Drawn 09/18/17  Hepatitis B Surface Antibody  (<10)  Date Hepatitis B Surface Antibody Drawn 09/18/17  Hemodialysis Consent Verified Yes  Hemodialysis Standing Orders Initiated Yes  ECG (Telemetry) Monitor On Yes  Prime Ordered Normal Saline  Length of  DialysisTreatment -hour(s) 3.5 Hour(s)  Dialyzer Elisio 17H NR  Dialysate 2K, 2.5 Ca  Dialysis Anticoagulant None  Dialysate Flow Ordered 800  Blood Flow Rate Ordered 400 mL/min  Ultrafiltration Goal 1.5 Liters  Pre Treatment Labs Phosphorus  Dialysis Blood Pressure Support Ordered Normal Saline  Education / Care Plan  Dialysis Education Provided Yes  Documented Education in Care Plan Yes  Fistula /  Graft Left Upper arm Arteriovenous fistula  Placement Date: 08/13/15   Placed prior to admission: Yes  Orientation: Left  Access Location: Upper arm  Access Type: Arteriovenous fistula  Site Condition No complications  Fistula / Graft Assessment Present;Thrill;Bruit  Drainage Description None

## 2017-10-13 NOTE — Progress Notes (Signed)
Pre HD assessment    10/13/17 2012  Neurological  Level of Consciousness Alert  Orientation Level Oriented to person;Oriented to situation;Oriented to time  Respiratory  Respiratory Pattern Regular;Unlabored  Chest Assessment Chest expansion symmetrical  Cardiac  ECG Monitor Yes  Cardiac Rhythm SB  Vascular  R Radial Pulse +2  L Radial Pulse +2  Integumentary  Integumentary (WDL) X  Skin Color Appropriate for ethnicity  Musculoskeletal  Musculoskeletal (WDL) X  Generalized Weakness Yes  Assistive Device None  GU Assessment  Genitourinary (WDL) X  Genitourinary Symptoms  (HD)  Psychosocial  Psychosocial (WDL) WDL

## 2017-10-13 NOTE — Progress Notes (Signed)
Post HD assessment. HD tx terminated early r/t increased venous pressure. Vascular consult was suggested due to similar experiences during previous treatments. MD made aware. Pt had a total of 1 hr of tx, epo 4,000 units was not given.    10/13/17 2143  Vital Signs  Temp (!) 97.5 F (36.4 C)  Temp Source Oral  Pulse Rate (!) 59  Pulse Rate Source Monitor  Resp 16  BP (!) 113/51  BP Location Right Arm  BP Method Automatic  Patient Position (if appropriate) Sitting  Oxygen Therapy  SpO2 99 %  O2 Device Room Air  Dialysis Weight  Weight  (unable to weigh pt, in chair, no scale available)  Type of Weight Post-Dialysis  Post-Hemodialysis Assessment  Rinseback Volume (mL) 250 mL  KECN 18.8 V  Dialyzer Clearance Lightly streaked  Duration of HD Treatment -hour(s) 1 hour(s)  Hemodialysis Intake (mL) 500 mL  UF Total -Machine (mL) 604 mL  Net UF (mL) 104 mL  Tolerated HD Treatment No (Comment)  Post-Hemodialysis Comments tx terminated early r/t increased venous pressure, MD aware, vascualr consult was suggested.   AVG/AVF Arterial Site Held (minutes) 10 minutes  AVG/AVF Venous Site Held (minutes) 10 minutes  Education / Care Plan  Dialysis Education Provided Yes  Documented Education in Care Plan Yes  Fistula / Graft Left Upper arm Arteriovenous fistula  Placement Date: 08/13/15   Placed prior to admission: Yes  Orientation: Left  Access Location: Upper arm  Access Type: Arteriovenous fistula  Site Condition No complications  Fistula / Graft Assessment Present;Thrill;Bruit  Status Deaccessed  Drainage Description None

## 2017-10-13 NOTE — Progress Notes (Signed)
Patient c/o back pain medicated per PRN orders, with good effect

## 2017-10-13 NOTE — Progress Notes (Signed)
HD tx end   10/13/17 2134  Vital Signs  Pulse Rate (!) 58  Pulse Rate Source Monitor  Resp 18  BP (!) 137/48  BP Location Right Arm  BP Method Automatic  Patient Position (if appropriate) Sitting  Oxygen Therapy  SpO2 97 %  O2 Device Room Air  During Hemodialysis Assessment  Dialysis Fluid Bolus Normal Saline  Bolus Amount (mL) 250 mL  Intra-Hemodialysis Comments Tx completed

## 2017-10-13 NOTE — Progress Notes (Signed)
Joyce [name] spoke with patient's care team for an update. Patient was with sitter. Patient did come out for lunch. CH provided silent prayer and will continue to follow patients status while rounding.

## 2017-10-13 NOTE — Progress Notes (Signed)
patient alert x 3 no distress noted, sitter at bedside.

## 2017-10-13 NOTE — Progress Notes (Signed)
Pre HD assessment  

## 2017-10-13 NOTE — Progress Notes (Signed)
Condition unchanged, sitter at bedside, will continue to monitor.

## 2017-10-13 NOTE — Progress Notes (Addendum)
[facility] MD Progress Note  10/13/2017 4:09 PM Joyce [name]  MRN:  [id number]  Subjective:   [name] has much improved but still far away from her baseline. She sleeps at night and is up during the day. Still has 1:1 sitter as she gest confused and is still unsteady on her feet. She is able to walk to the dining area with assist wearing a belt. Today she came to my office for the first time and has a semi decent conversation. She is confused abou her situation and wanting to go home now. She has been harassing her family asking for the same. She agreed to dialysis today.   Spoke with her daughter-in-law. She and her husband are in [place]. Joyce [name]'s ex-husband just passed away unexpectedly. The had a "bad" conversation with the patient yesterday as she kept calling about going home. Patient is unaware of the loss.  Spoke with Ursula Beath, who visited on Sunday and noticed improvement.   Left a message for Dr. Thurmond Butts, primary psychiatrist.  Treatment plan. We will continue Zyprexa 15 mg nightly and dialysis. She may require more ECT treatment.  Social/diposition. Joyce [name] is unlikely to be able to return to independent living. We do not have a solid plan. We were counting on her son who is now tied up in [facility].  Principal Problem: Bipolar affective disorder, current episode manic with psychotic symptoms ([place]) Diagnosis:   Patient Active Problem List   Diagnosis Date Noted  . Bipolar affective disorder, current episode depressed with psychotic symptoms ([place]) [F31.2] 09/17/2017    Priority: High  . Palliative care encounter [Z51.5]   . Bipolar I disorder, most recent episode (or current) manic ([place]) [F31.10] 09/02/2017  . Acute delirium [R41.0] 09/02/2017  . Altered mental status [R41.82]   . Acute encephalopathy [G93.40] 09/01/2017  . ESRD on dialysis ([place]) [N18.6, Z99.2] 04/06/2017  . Complication of vascular access for dialysis [T82.9XXA] 04/06/2017  . Hematuria [R31.9] 12/18/2016  .  Polycystic kidney [Q61.3] 10/12/2016  . Ruptured cyst of kidney [Q61.00] 10/03/2016  . HTN (hypertension) [I10] 10/01/2016  . Chronic kidney disease [N18.9] 10/01/2016  . Anemia associated with chronic renal failure [D63.1] 09/24/2016  . Acute kidney insufficiency [N28.9] 07/28/2016  . Noninfectious diarrhea [K52.9]   . Benign neoplasm of cecum [D12.0]   . Benign neoplasm of ascending colon [D12.2]   . Diarrhea [R19.7]   . Nausea [R11.0]   . Gastric polyp [K31.7]   . Chronic constipation [K59.09] 12/27/2014  . Nausea with vomiting [R11.2] 12/27/2014  . Left sided abdominal pain [R10.9] 12/27/2014  . Personal history of colonic polyps [Z86.010] 05/10/2013   Total Time spent with patient: 30 minutes  Past Psychiatric History: bipolar disorder.  Past Medical History:  Past Medical History:  Diagnosis Date  . Anal fissure   . Bipolar affective disorder ([place])   . CKD (chronic kidney disease)    Dr Holley Raring [name] 4  . Colon polyps   . Diverticulitis   . Diverticulitis   . Family history of adverse reaction to anesthesia    mom - PONV  . GERD (gastroesophageal reflux disease)   . Headache    migraines - none over 10 yrs  . Heart murmur   . History of hiatal hernia   . Hypertension   . Pancreatitis    Valproic acid  . Vertigo     Past Surgical History:  Procedure Laterality Date  . A/V FISTULAGRAM Left 04/14/2017   Procedure: A/V Fistulagram;  Surgeon: Delana Meyer,  Dolores Lory, MD;  Location: [place] [facility];  Service: Cardiovascular;  Laterality: Left;  . A/V FISTULAGRAM Left 06/09/2017   Procedure: A/V FISTULAGRAM;  Surgeon: Katha Cabal, MD;  Location: [place] [facility];  Service: Cardiovascular;  Laterality: Left;  . ABDOMINAL HYSTERECTOMY  1990 ?  . AV FISTULA PLACEMENT  4/30  . BREAST EXCISIONAL BIOPSY Left 1994   neg surgical bx  . CHOLECYSTECTOMY  2003  . COLONOSCOPY  2014   Dr. Jamal Collin  . COLONOSCOPY WITH PROPOFOL N/A 09/24/2015   Procedure: COLONOSCOPY  WITH random colon byopies.;  Surgeon: Lucilla Lame, MD;  Location: [place];  Service: Endoscopy;  Laterality: N/A;  . ESOPHAGOGASTRODUODENOSCOPY (EGD) WITH PROPOFOL N/A 09/24/2015   Procedure: ESOPHAGOGASTRODUODENOSCOPY (EGD) ;  Surgeon: Lucilla Lame, MD;  Location: [place];  Service: Endoscopy;  Laterality: N/A;  . EYE SURGERY    . PERIPHERAL VASCULAR CATHETERIZATION N/A 05/29/2015   Procedure: A/V Shuntogram/Fistulagram;  Surgeon: Katha Cabal, MD;  Location: [place] [facility];  Service: Cardiovascular;  Laterality: N/A;  . PERIPHERAL VASCULAR CATHETERIZATION N/A 05/29/2015   Procedure: A/V Shunt Intervention;  Surgeon: Katha Cabal, MD;  Location: [place] [facility];  Service: Cardiovascular;  Laterality: N/A;  . POLYPECTOMY  09/24/2015   Procedure: POLYPECTOMY INTESTINAL;  Surgeon: Lucilla Lame, MD;  Location: [place];  Service: Endoscopy;;  cecal polyp ascending polyp   Family History:  Family History  Problem Relation Age of Onset  . Stroke Father   . Hypertension Father   . Breast cancer Other   . Colon cancer Neg Hx   . Liver disease Neg Hx    Family Psychiatric  History: none Social History:  Social History   Substance and Sexual Activity  Alcohol Use No  . Alcohol/week: 0.0 oz     Social History   Substance and Sexual Activity  Drug Use No    Social History   Socioeconomic History  . Marital status: Single    Spouse name: Not on file  . Number of children: 1  . Years of education: Not on file  . Highest education level: Not on file  Occupational History  . Occupation: Surveyor, quantity: [employer]  Social Needs  . Financial resource strain: Not on file  . Food insecurity:    Worry: Not on file    Inability: Not on file  . Transportation needs:    Medical: Not on file    Non-medical: Not on file  Tobacco Use  . Smoking status: Never Smoker  . Smokeless tobacco: Never Used  Substance and Sexual Activity  .  Alcohol use: No    Alcohol/week: 0.0 oz  . Drug use: No  . Sexual activity: Not Currently  Lifestyle  . Physical activity:    Days per week: Not on file    Minutes per session: Not on file  . Stress: Not on file  Relationships  . Social connections:    Talks on phone: Not on file    Gets together: Not on file    Attends religious service: Not on file    Active member of club or organization: Not on file    Attends meetings of clubs or organizations: Not on file    Relationship status: Not on file  Other Topics Concern  . 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  Additional Social History:    History of alcohol / drug use?: No history of alcohol / drug abuse                    Sleep: Fair  Appetite:  Fair  Current Medications: Current Facility-Administered Medications  Medication Dose Route Frequency Provider Last Rate Last Dose  . acetaminophen (TYLENOL) tablet 325 mg  325 mg Oral BID PRN Clapacs, [name], MD   325 mg at 10/12/17 0829  . alum & mag hydroxide-simeth (MAALOX/MYLANTA) 200-200-20 MG/5ML suspension 30 mL  30 mL Oral Q4H PRN Clapacs, [name], MD   30 mL at 10/09/17 2246  . bisacodyl (DULCOLAX) EC tablet 5 mg  5 mg Oral Daily PRN Clapacs, [name], MD      . brimonidine (ALPHAGAN) 0.2 % ophthalmic solution 1 drop  1 drop Both Eyes BID Clapacs, [name], MD   1 drop at 10/13/17 0913  . [START ON 10/17/2017] cyanocobalamin ((VITAMIN B-12)) injection 1,000 mcg  1,000 mcg Intramuscular Q30 days Clapacs, [name], MD      . dicyclomine (BENTYL) tablet 20 mg  20 mg Oral TID AC Clapacs, [name], MD   20 mg at 10/12/17 1650  . epoetin alfa (EPOGEN,PROCRIT) injection 4,000 Units  4,000 Units Intravenous Q M,W,F-HD [name], MD   4,000 Units at 10/10/17 1621  . feeding supplement (NEPRO CARB STEADY) liquid 237 mL  237 mL Oral BID BM Candiss Norse, [name], MD   237 mL at 10/12/17 1453  .  fentaNYL (SUBLIMAZE) injection 25 mcg  25 mcg Intravenous Q5 min PRN [name], MD      . haloperidol (HALDOL) tablet 5 mg  5 mg Oral Q8H PRN Mylah Baynes B, MD   5 mg at 10/06/17 1238   Or  . haloperidol lactate (HALDOL) injection 5 mg  5 mg Intramuscular Q8H PRN Yvonne Stopher B, MD   5 mg at 10/01/17 0908  . lidocaine-prilocaine (EMLA) cream 1 application  1 application Topical Q M,W,F Clapacs, [name], MD   1 application at 22/97/98 1219  . LORazepam (ATIVAN) injection 2 mg  2 mg Intramuscular Q4H PRN Clapacs, [name], MD      . LORazepam (ATIVAN) injection 2 mg  2 mg Intramuscular Q6H PRN Clapacs, [name], MD   2 mg at 10/10/17 1447  . magnesium hydroxide (MILK OF MAGNESIA) suspension 30 mL  30 mL Oral Daily PRN Clapacs, [name], MD      . metoprolol tartrate (LOPRESSOR) tablet 12.5 mg  12.5 mg Oral BID [name], MD   12.5 mg at 10/12/17 1652  . midodrine (PROAMATINE) tablet 10 mg  10 mg Oral TID WC [name], MD   10 mg at 10/13/17 1148  . multivitamin (RENA-VIT) tablet 1 tablet  1 tablet Oral QHS Orlando Thalmann B, MD   1 tablet at 10/12/17 2150  . OLANZapine zydis (ZYPREXA) disintegrating tablet 15 mg  15 mg Oral QHS Rambo Sarafian B, MD   15 mg at 10/12/17 2151  . ondansetron (ZOFRAN) injection 4 mg  4 mg Intravenous Once PRN [name], MD      . pantoprazole (PROTONIX) EC tablet 40 mg  40 mg Oral Daily Clapacs, [name], MD   40 mg at 10/12/17 0829  . pentafluoroprop-tetrafluoroeth (GEBAUERS) aerosol   Topical PRN [name], MD      . timolol (TIMOPTIC) 0.5 % ophthalmic solution 1 drop  1 drop Both Eyes BID Clapacs, John  T, MD   1 drop at 10/13/17 0912  . vitamin C (ASCORBIC ACID) tablet 500 mg  500 mg Oral BID Staci Carver B, MD   500 mg at 10/12/17 1650    Lab Results: No results found for this or any previous visit (from the past 48 hour(s)).  Blood Alcohol level:  Lab Results  Component Value Date   ETH <10 35/57/3220     Metabolic Disorder Labs: Lab Results  Component Value Date   HGBA1C 4.8 09/18/2017   MPG 91.06 09/18/2017   No results found for: PROLACTIN Lab Results  Component Value Date   CHOL 251 (H) 09/18/2017   TRIG 124 09/18/2017   HDL 66 09/18/2017   CHOLHDL 3.8 09/18/2017   VLDL 25 09/18/2017   LDLCALC 160 (H) 09/18/2017   LDLCALC 43 05/14/2014    Physical Findings: AIMS: Facial and Oral Movements Muscles of Facial Expression: None, normal Lips and Perioral Area: None, normal Jaw: None, normal Tongue: None, normal,Extremity Movements Upper (arms, wrists, hands, fingers): None, normal Lower (legs, knees, ankles, toes): None, normal, Trunk Movements Neck, shoulders, hips: None, normal, Overall Severity Severity of abnormal movements (highest score from questions above): None, normal Incapacitation due to abnormal movements: None, normal Patient's awareness of abnormal movements (rate only patient's report): No Awareness, Dental Status Current problems with teeth and/or dentures?: No Does patient usually wear dentures?: No  CIWA:    COWS:     Musculoskeletal: Strength & Muscle Tone: decreased Gait & Station: unsteady Patient leans: N/A  Psychiatric Specialty Exam: Physical Exam  Nursing note and vitals reviewed. Psychiatric: Her speech is normal. Her affect is labile. She is slowed. Thought content is delusional. Cognition and memory are impaired. She expresses inappropriate judgment.    Review of Systems  Neurological: Negative.   Psychiatric/Behavioral: Positive for depression.  All other systems reviewed and are negative.   Blood pressure (!) 95/55, pulse 68, temperature 98.1 F (36.7 C), temperature source Oral, resp. rate 18, height 5' 3.5" (1.613 m), weight 68.5 kg (151 lb), SpO2 97 %.Body mass index is 26.33 kg/m.  General Appearance: Fairly Groomed  Eye Contact:  Good  Speech:  Clear and Coherent  Volume:  Normal  Mood:  Irritable  Affect:  Congruent   Thought Process:  Disorganized and Descriptions of Associations: Loose  Orientation:  Other:  person and place not situation  Thought Content:  Delusions and Paranoid Ideation  Suicidal Thoughts:  No  Homicidal Thoughts:  No  Memory:  Immediate;   Poor Recent;   Poor Remote;   Poor  Judgement:  Poor  Insight:  Lacking  Psychomotor Activity:  Decreased  Concentration:  Concentration: Fair and Attention Span: Poor  Recall:  Poor  Fund of Knowledge:  Poor  Language:  Poor  Akathisia:  No  Handed:  Right  AIMS (if indicated):     Assets:  Communication Skills Desire for Improvement Financial Resources/Insurance Housing Social Support  ADL's:  Intact  Cognition:  WNL  Sleep:  Number of Hours: 6.75     Treatment Plan Summary: Daily contact with patient to assess and evaluate symptoms and progress in treatment and Medication management   Ms.Joyce [name] a 72 y.o.white femalewith bipolar diosrder and end stage renal disease on hemodialysis admitted for mixed episode that turned into catatonic depression who responded favorably to initial ECT treatment.   #bipolar disorder  -continue Zyprexa 15 mg nightly -resume ECT on 4/1 if necessary  #Chest pain -cardiology consult is greatly appreciated  -  no acute event -continue Tylenol for chest wall pain  #gerd -continue pantprazole 40 mg daily -continue ondansetron PRN  #ESRD -continue dialysis per nephrology   #Deconditioning -PT consult  #Disposition -sister has HCPOA -discharge to home with family? -follow up with Dr. Bryna Colander, MD 10/13/2017, 4:09 PM

## 2017-10-13 NOTE — Progress Notes (Signed)
sitter at bedside no distress noted, interacting appropriately with staff.

## 2017-10-13 NOTE — Progress Notes (Signed)
Post HD assessment   10/13/17 2152  Neurological  Level of Consciousness Alert  Orientation Level Oriented to person;Oriented to situation;Oriented to time  Respiratory  Respiratory Pattern Regular;Unlabored  Chest Assessment Chest expansion symmetrical  Cardiac  ECG Monitor Yes  Cardiac Rhythm SB  Vascular  R Radial Pulse +2  L Radial Pulse +2  Integumentary  Integumentary (WDL) X  Skin Color Appropriate for ethnicity  Musculoskeletal  Musculoskeletal (WDL) X  Generalized Weakness Yes  Assistive Device None  GU Assessment  Genitourinary (WDL) X  Genitourinary Symptoms  (HD)  Psychosocial  Psychosocial (WDL) WDL

## 2017-10-13 NOTE — Progress Notes (Signed)
No distress noted, will continue to monitor, sitter at bedside.

## 2017-10-13 NOTE — Plan of Care (Signed)
Patient is pleasant and cooperative.  Denies SI/HI/AVH.  Mild confusion noted at times.  Medication compliant.  Up to dayroom for meals.  Appetite improved.  [place] group attendance.  Problem: Pain Managment: Goal: General experience of comfort will improve Outcome: Progressing   Problem: Education: Goal: Will be free of psychotic symptoms Outcome: Progressing Goal: Knowledge of the prescribed therapeutic regimen will improve Outcome: Progressing   Problem: Coping: Goal: Ability to cope will improve Outcome: Progressing Goal: Ability to verbalize feelings will improve Outcome: Progressing   Problem: Health Behavior/Discharge Planning: Goal: Compliance with prescribed medication regimen will improve Outcome: Progressing   Problem: Nutritional: Goal: Ability to achieve adequate nutritional intake will improve Outcome: Progressing   Problem: Safety: Goal: Ability to remain free from injury will improve Outcome: Progressing   Problem: Self-Concept: Goal: Ability to verbalize positive feelings about self will improve Outcome: Progressing   Problem: Activity: Goal: Ability to tolerate increased activity will improve Outcome: Progressing   Problem: Coping: Goal: Development of coping mechanisms to deal with changes in body function or appearance will improve Outcome: Progressing   Problem: Fluid Volume: Goal: Compliance with measures to maintain balanced fluid volume will improve Outcome: Progressing   Problem: Physical Regulation: Goal: Complications related to the disease process, condition or treatment will be avoided or minimized Outcome: Progressing   Problem: Activity: Goal: Sleeping patterns will improve Outcome: Progressing   Problem: Spiritual Needs Goal: Ability to function at adequate level Outcome: Progressing

## 2017-10-13 NOTE — Progress Notes (Signed)
HD tx start   10/13/17 2024  Vital Signs  Pulse Rate (!) 55  Pulse Rate Source Monitor  Resp 18  BP 118/61  BP Location Right Arm  BP Method Automatic  Patient Position (if appropriate) Sitting  Oxygen Therapy  SpO2 100 %  O2 Device Room Air  During Hemodialysis Assessment  Blood Flow Rate (mL/min) 400 mL/min  Arterial Pressure (mmHg) -120 mmHg  Venous Pressure (mmHg) 220 mmHg  Transmembrane Pressure (mmHg) 70 mmHg  Ultrafiltration Rate (mL/min) 570 mL/min  Dialysate Flow Rate (mL/min) 800 ml/min  Conductivity: Machine  13.8  HD Safety Checks Performed Yes  Dialysis Fluid Bolus Normal Saline  Bolus Amount (mL) 250 mL  Intra-Hemodialysis Comments Tx initiated  Fistula / Graft Left Upper arm Arteriovenous fistula  Placement Date: 08/13/15   Placed prior to admission: Yes  Orientation: Left  Access Location: Upper arm  Access Type: Arteriovenous fistula  Status Accessed  Needle Size 15

## 2017-10-13 NOTE — Progress Notes (Signed)
Patient is alert, no distress noted , affect is bright, sitter at bedside.

## 2017-10-13 NOTE — Progress Notes (Signed)
Hourly rounding; 0800 Resting in bed eyes closed.  1:1 maintained 0900 Resting in bed eyes closed.  1:1 maintained 1000 Resting in bed eyes closed.  1:1 maintained 1100 Resting in bed eyes closed.  1:1 maintained 1200 Resting in bed eyes closed.  1:1 maintained 1300 Resting in bed eyes closed.  1:1 maintained 1400 In community room.  1:1 maintained 1500 Resting in bed eyes closed.  1:1 maintained 1600    Resting in bed eyes closed.  1:1 maintained   1700 Resting in bed eyes closed.  1:1 maintained 1800 Resting in bed eyes closed.  1:1 maintained 1900 resting quietly in bed with eyes closed.  1:1 maintained.

## 2017-10-13 NOTE — Progress Notes (Signed)
Sitter at bedside, no distress noted

## 2017-10-13 NOTE — Progress Notes (Signed)
Patient alert and oriented x 3 no distress noted resting in bed with eyes open, sitter at bedside.

## 2017-10-13 NOTE — Progress Notes (Signed)
Patient is alert and oriented x 3 no distress or discomfort noted, resting quietly will continue to monitor.

## 2017-10-13 NOTE — [facility] Group Notes (Signed)
  10/13/2017  Time: 0900  Type of Therapy and Topic: Group Therapy: Goals Group: SMART Goals   Participation Level:  Did Not Attend   Description of Group:   The purpose of a daily goals group is to assist and guide patients in setting recovery/wellness-related goals. The objective is to set goals as they relate to the crisis in which they were admitted. Patients will be using SMART goal modalities to set measurable goals. Characteristics of realistic goals will be discussed and patients will be assisted in setting and processing how one will reach their goal. Facilitator will also assist patients in applying interventions and coping skills learned in psycho-education groups to the SMART goal and process how one will achieve defined goal.   Therapeutic Goals:  -Patients will develop and document one goal related to or their crisis in which brought them into treatment.  -Patients will be guided by LCSW using SMART goal setting modality in how to set a measurable, attainable, realistic and time sensitive goal.  -Patients will process barriers in reaching goal.  -Patients will process interventions in how to overcome and successful in reaching goal.   Patient's Goal: Pt was invited to attend group but chose not to attend. CSW will continue to encourage pt to attend group throughout their admission.    Therapeutic Modalities:  Motivational Interviewing  Cognitive Behavioral Therapy  Crisis Intervention Model  SMART goals setting   Alden Hipp, MSW, LCSW 10/13/2017 9:39 AM

## 2017-10-14 MED ORDER — ROPINIROLE HCL 1 MG PO TABS
1.0000 mg | ORAL_TABLET | Freq: Every day | ORAL | Status: DC
  Administered 2017-10-14: 1 mg via ORAL
  Filled 2017-10-14 (×2): qty 1

## 2017-10-14 NOTE — Progress Notes (Signed)
Resting in bed voice no concerns 1:1 present

## 2017-10-14 NOTE — Progress Notes (Signed)
Ambulated to dayroom  For dinner  Appetite fair  121:1 present

## 2017-10-14 NOTE — Plan of Care (Signed)
  Problem: Pain Managment: Goal: General experience of comfort will improve Outcome: Progressing   Problem: Education: Goal: Will be free of psychotic symptoms Outcome: Progressing Note:  Alert and oriented.  No signs of psychosis.   Goal: Knowledge of the prescribed therapeutic regimen will improve Outcome: Progressing   Problem: Coping: Goal: Ability to cope will improve Outcome: Progressing Goal: Ability to verbalize feelings will improve Outcome: Progressing Note:  Verbalizes feelings without difficult.  Verbalizes that she is ready to go home.  Further states that she does not want to do dialysis.     Problem: Health Behavior/Discharge Planning: Goal: Compliance with prescribed medication regimen will improve Outcome: Progressing Note:  Refused am and afternoon medications.  Agreeable to evening medications.     Problem: Nutritional: Goal: Ability to achieve adequate nutritional intake will improve Outcome: Progressing Note:  Appetite improved.  Drinking nutritional supplements.     Problem: Safety: Goal: Ability to remain free from injury will improve Outcome: Progressing Note:  Remains safe on the unit.  1:1 sitter for safety.     Problem: Self-Concept: Goal: Ability to verbalize positive feelings about self will improve Outcome: Progressing   Problem: Activity: Goal: Ability to tolerate increased activity will improve Outcome: Progressing Note:  Able to ambulate to bathroom.  Up to dayroom for meals.    Patient refused to allow PT to work with her.

## 2017-10-14 NOTE — Progress Notes (Signed)
Attempted to help patient  With bath  Whom she refused  Escorted  Back to bed  1:1  Present

## 2017-10-14 NOTE — Progress Notes (Signed)
Sitting up in bed  Eating crackers and peanut butter

## 2017-10-14 NOTE — Progress Notes (Signed)
Patient ID: Joyce [name], female   DOB: 1946/01/20, 72 y.o.   MRN: [id number] Patient returned to the unit from dialysis suite after unsuccessful attempt (see HD, Ms Lenna Sciara, RN Notes); pt continues to verbalize the need for HD, vows that she will not comply with it after discharge from the hospital; appeared to be much better in mood and affect.

## 2017-10-14 NOTE — Progress Notes (Signed)
Up to bathroom 1:1 present  With patient  Noted vocal today

## 2017-10-14 NOTE — Progress Notes (Signed)
Hourly rounding 0800  Up to community room eating breakfast. Safety sitter maintained.   0900 Resting quietly in bed with eyes closed.  Safety sitter maintained.   1000 Resting quietly in bed with eyes closed.  Safety sitter maintained 1100 Resting quietly in bed with eyes closed.  Safety sitter maintained 1200 Resting quietly in bed with eyes closed.  Safety sitter maintained

## 2017-10-14 NOTE — Plan of Care (Signed)
Patient slept for Estimated Hours of 3 hours only despite Ativan 2 mg IM and Haldol 5 mg PO; restless, turning and tossing all night, confused, nonsensical "give me something to knock me out..."; Continuous Observation on 1:1 Arms Length for safety maintained, room free of safety hazards, patient sustains no injury or falls during this shift.  Problem: Education: Goal: Knowledge of the prescribed therapeutic regimen will improve Outcome: Progressing   Problem: Coping: Goal: Ability to verbalize feelings will improve Outcome: Progressing   Problem: Nutritional: Goal: Ability to achieve adequate nutritional intake will improve Outcome: Progressing   Problem: Safety: Goal: Ability to remain free from injury will improve Outcome: Progressing   Problem: Self-Concept: Goal: Ability to verbalize positive feelings about self will improve Outcome: Progressing   Problem: Activity: Goal: Ability to tolerate increased activity will improve Outcome: Progressing

## 2017-10-14 NOTE — Progress Notes (Signed)
[facility] MD Progress Note  10/14/2017 4:05 PM Joyce [name]  MRN:  [id number]  Subjective:   [name] did not complete dialysis last night. Vascular surgery consult is appreciated. Surgery planned for Friday. Patient in agreement. She slept 3 hours only possibly from restless legs. She has been too sleepy this morning to work with PT but then refused treatment for no reason. Ate dinner well in a dining area. Pleasant and engaging.    Spoke with Dr. Thurmond Butts to update.   Treatment plan. We continue Zyprexa 15 mg at night. Resume ECT on Monday. Restart Requip for restless legs. Fistula surgery on Friday.   Social/disposition. She lives independently and is unlikely to be able to care for herself. Her son is temporarily unavailable on family emergency in [facility] [place].  Principal Problem: Bipolar affective disorder, current episode manic with psychotic symptoms ([place]) Diagnosis:   Patient Active Problem List   Diagnosis Date Noted  . Bipolar affective disorder, current episode depressed with psychotic symptoms ([place]) [F31.2] 09/17/2017    Priority: High  . Palliative care encounter [Z51.5]   . Bipolar I disorder, most recent episode (or current) manic ([place]) [F31.10] 09/02/2017  . Acute delirium [R41.0] 09/02/2017  . Altered mental status [R41.82]   . Acute encephalopathy [G93.40] 09/01/2017  . ESRD on dialysis ([place]) [N18.6, Z99.2] 04/06/2017  . Complication of vascular access for dialysis [T82.9XXA] 04/06/2017  . Hematuria [R31.9] 12/18/2016  . Polycystic kidney [Q61.3] 10/12/2016  . Ruptured cyst of kidney [Q61.00] 10/03/2016  . HTN (hypertension) [I10] 10/01/2016  . Chronic kidney disease [N18.9] 10/01/2016  . Anemia associated with chronic renal failure [D63.1] 09/24/2016  . Acute kidney insufficiency [N28.9] 07/28/2016  . Noninfectious diarrhea [K52.9]   . Benign neoplasm of cecum [D12.0]   . Benign neoplasm of ascending colon [D12.2]   . Diarrhea [R19.7]   . Nausea [R11.0]   . Gastric polyp  [K31.7]   . Chronic constipation [K59.09] 12/27/2014  . Nausea with vomiting [R11.2] 12/27/2014  . Left sided abdominal pain [R10.9] 12/27/2014  . Personal history of colonic polyps [Z86.010] 05/10/2013   Total Time spent with patient: 30 minutes  Past Psychiatric History: bipolar illness  Past Medical History:  Past Medical History:  Diagnosis Date  . Anal fissure   . Bipolar affective disorder ([place])   . CKD (chronic kidney disease)    Dr Holley Raring [name] 4  . Colon polyps   . Diverticulitis   . Diverticulitis   . Family history of adverse reaction to anesthesia    mom - PONV  . GERD (gastroesophageal reflux disease)   . Headache    migraines - none over 10 yrs  . Heart murmur   . History of hiatal hernia   . Hypertension   . Pancreatitis    Valproic acid  . Vertigo     Past Surgical History:  Procedure Laterality Date  . A/V FISTULAGRAM Left 04/14/2017   Procedure: A/V Fistulagram;  Surgeon: Katha Cabal, MD;  Location: [place] [facility];  Service: Cardiovascular;  Laterality: Left;  . A/V FISTULAGRAM Left 06/09/2017   Procedure: A/V FISTULAGRAM;  Surgeon: Katha Cabal, MD;  Location: [place] [facility];  Service: Cardiovascular;  Laterality: Left;  . ABDOMINAL HYSTERECTOMY  1990 ?  . AV FISTULA PLACEMENT  4/30  . BREAST EXCISIONAL BIOPSY Left 1994   neg surgical bx  . CHOLECYSTECTOMY  2003  . COLONOSCOPY  2014   Dr. Jamal Collin  . COLONOSCOPY WITH PROPOFOL N/A 09/24/2015  Procedure: COLONOSCOPY WITH random colon byopies.;  Surgeon: Lucilla Lame, MD;  Location: [place];  Service: Endoscopy;  Laterality: N/A;  . ESOPHAGOGASTRODUODENOSCOPY (EGD) WITH PROPOFOL N/A 09/24/2015   Procedure: ESOPHAGOGASTRODUODENOSCOPY (EGD) ;  Surgeon: Lucilla Lame, MD;  Location: [place];  Service: Endoscopy;  Laterality: N/A;  . EYE SURGERY    . PERIPHERAL VASCULAR CATHETERIZATION N/A 05/29/2015   Procedure: A/V Shuntogram/Fistulagram;  Surgeon: Katha Cabal, MD;  Location: [place] [facility];  Service: Cardiovascular;  Laterality: N/A;  . PERIPHERAL VASCULAR CATHETERIZATION N/A 05/29/2015   Procedure: A/V Shunt Intervention;  Surgeon: Katha Cabal, MD;  Location: [place] [facility];  Service: Cardiovascular;  Laterality: N/A;  . POLYPECTOMY  09/24/2015   Procedure: POLYPECTOMY INTESTINAL;  Surgeon: Lucilla Lame, MD;  Location: [place];  Service: Endoscopy;;  cecal polyp ascending polyp   Family History:  Family History  Problem Relation Age of Onset  . Stroke Father   . Hypertension Father   . Breast cancer Other   . Colon cancer Neg Hx   . Liver disease Neg Hx    Family Psychiatric  History: none Social History:  Social History   Substance and Sexual Activity  Alcohol Use No  . Alcohol/week: 0.0 oz     Social History   Substance and Sexual Activity  Drug Use No    Social History   Socioeconomic History  . Marital status: Single    Spouse name: Not on file  . Number of children: 1  . Years of education: Not on file  . Highest education level: Not on file  Occupational History  . Occupation: Surveyor, quantity: [employer]  Social Needs  . Financial resource strain: Not on file  . Food insecurity:    Worry: Not on file    Inability: Not on file  . Transportation needs:    Medical: Not on file    Non-medical: Not on file  Tobacco Use  . Smoking status: Never Smoker  . Smokeless tobacco: Never Used  Substance and Sexual Activity  . Alcohol use: No    Alcohol/week: 0.0 oz  . Drug use: No  . Sexual activity: Not Currently  Lifestyle  . Physical activity:    Days per week: Not on file    Minutes per session: Not on file  . Stress: Not on file  Relationships  . Social connections:    Talks on phone: Not on file    Gets together: Not on file    Attends religious service: Not on file    Active member of club or organization: Not on file    Attends meetings of clubs or organizations: Not  on file    Relationship status: Not on file  Other Topics Concern  . 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   Additional Social History:    History of alcohol / drug use?: No history of alcohol / drug abuse                    Sleep: Poor  Appetite:  Fair  Current Medications: Current Facility-Administered Medications  Medication Dose Route Frequency Provider Last Rate Last Dose  . acetaminophen (TYLENOL) tablet 325 mg  325 mg Oral BID PRN Clapacs, [name], MD   325 mg at 10/14/17 0442  . alum & mag hydroxide-simeth (MAALOX/MYLANTA) 200-200-20 MG/5ML  suspension 30 mL  30 mL Oral Q4H PRN Clapacs, [name], MD   30 mL at 10/09/17 2246  . bisacodyl (DULCOLAX) EC tablet 5 mg  5 mg Oral Daily PRN Clapacs, [name], MD      . brimonidine (ALPHAGAN) 0.2 % ophthalmic solution 1 drop  1 drop Both Eyes BID Clapacs, [name], MD   1 drop at 10/13/17 1814  . [START ON 10/17/2017] cyanocobalamin ((VITAMIN B-12)) injection 1,000 mcg  1,000 mcg Intramuscular Q30 days Clapacs, [name], MD      . dicyclomine (BENTYL) tablet 20 mg  20 mg Oral TID AC Clapacs, [name], MD   20 mg at 10/12/17 1650  . epoetin alfa (EPOGEN,PROCRIT) injection 4,000 Units  4,000 Units Intravenous Q M,W,F-HD [name], MD   4,000 Units at 10/10/17 1621  . feeding supplement (NEPRO CARB STEADY) liquid 237 mL  237 mL Oral BID BM [name], MD   237 mL at 10/13/17 1752  . fentaNYL (SUBLIMAZE) injection 25 mcg  25 mcg Intravenous Q5 min PRN [name], MD      . haloperidol (HALDOL) tablet 5 mg  5 mg Oral Q8H PRN Kura Bethards B, MD   5 mg at 10/14/17 0133   Or  . haloperidol lactate (HALDOL) injection 5 mg  5 mg Intramuscular Q8H PRN Lofton Leon B, MD   5 mg at 10/01/17 0908  . lidocaine-prilocaine (EMLA) cream 1 application  1 application Topical Q M,W,F Clapacs, [name], MD   1 application at 78/93/81 1219  .  LORazepam (ATIVAN) injection 2 mg  2 mg Intramuscular Q4H PRN Clapacs, [name], MD   2 mg at 10/14/17 0134  . LORazepam (ATIVAN) injection 2 mg  2 mg Intramuscular Q6H PRN Clapacs, [name], MD   2 mg at 10/13/17 1843  . magnesium hydroxide (MILK OF MAGNESIA) suspension 30 mL  30 mL Oral Daily PRN Clapacs, [name], MD      . metoprolol tartrate (LOPRESSOR) tablet 12.5 mg  12.5 mg Oral BID [name], MD   12.5 mg at 10/12/17 1652  . midodrine (PROAMATINE) tablet 10 mg  10 mg Oral TID WC [name], MD   10 mg at 10/13/17 1813  . multivitamin (RENA-VIT) tablet 1 tablet  1 tablet Oral QHS Lotus Gover B, MD   1 tablet at 10/13/17 2239  . OLANZapine zydis (ZYPREXA) disintegrating tablet 15 mg  15 mg Oral QHS Jolan Upchurch B, MD   15 mg at 10/13/17 2239  . ondansetron (ZOFRAN) injection 4 mg  4 mg Intravenous Once PRN [name], MD      . pantoprazole (PROTONIX) EC tablet 40 mg  40 mg Oral Daily Clapacs, [name], MD   40 mg at 10/12/17 0829  . pentafluoroprop-tetrafluoroeth (GEBAUERS) aerosol   Topical PRN [name], MD      . timolol (TIMOPTIC) 0.5 % ophthalmic solution 1 drop  1 drop Both Eyes BID Clapacs, [name], MD   1 drop at 10/13/17 1814  . vitamin C (ASCORBIC ACID) tablet 500 mg  500 mg Oral BID Alika Eppes B, MD   500 mg at 10/12/17 1650    Lab Results:  Results for orders placed or performed during the hospital encounter of 09/17/17 (from the past 48 hour(s))  Phosphorus     Status: None   Collection Time: 10/13/17 10:06 PM  Result Value Ref Range   Phosphorus 3.5 2.5 - 4.6 mg/dL    Comment: Performed  at [facility], [address] [place], [place] [address]    Blood Alcohol level:  Lab Results  Component Value Date   [facility] <10 35/45/6256    Metabolic Disorder Labs: Lab Results  Component Value Date   HGBA1C 4.8 09/18/2017   MPG 91.06 09/18/2017   No results found for: PROLACTIN Lab Results  Component Value Date   CHOL 251 (H)  09/18/2017   TRIG 124 09/18/2017   HDL 66 09/18/2017   CHOLHDL 3.8 09/18/2017   VLDL 25 09/18/2017   LDLCALC 160 (H) 09/18/2017   LDLCALC 43 05/14/2014    Physical Findings: AIMS: Facial and Oral Movements Muscles of Facial Expression: None, normal Lips and Perioral Area: None, normal Jaw: None, normal Tongue: None, normal,Extremity Movements Upper (arms, wrists, hands, fingers): None, normal Lower (legs, knees, ankles, toes): None, normal, Trunk Movements Neck, shoulders, hips: None, normal, Overall Severity Severity of abnormal movements (highest score from questions above): None, normal Incapacitation due to abnormal movements: None, normal Patient's awareness of abnormal movements (rate only patient's report): No Awareness, Dental Status Current problems with teeth and/or dentures?: No Does patient usually wear dentures?: No  CIWA:    COWS:     Musculoskeletal: Strength & Muscle Tone: within normal limits Gait & Station: normal Patient leans: N/A  Psychiatric Specialty Exam: Physical Exam  Nursing note and vitals reviewed. Psychiatric: Her affect is blunt. Her speech is delayed. She is slowed and withdrawn. Thought content is paranoid. Cognition and memory are impaired. She expresses impulsivity.    Review of Systems  Neurological: Negative.   Psychiatric/Behavioral: Positive for depression. The patient has insomnia.   All other systems reviewed and are negative.   Blood pressure 118/75, pulse 75, temperature 97.7 F (36.5 C), temperature source Oral, resp. rate 18, height 5' 3.5" (1.613 m), weight 68.5 kg (151 lb), SpO2 99 %.Body mass index is 26.33 kg/m.  General Appearance: Casual  Eye Contact:  Good  Speech:  Clear and Coherent  Volume:  Normal  Mood:  Irritable  Affect:  Congruent  Thought Process:  Goal Directed and Descriptions of Associations: Intact  Orientation:  Full (Time, Place, and Person)  Thought Content:  Delusions and Paranoid Ideation   Suicidal Thoughts:  No  Homicidal Thoughts:  No  Memory:  Immediate;   Poor Recent;   Poor Remote;   Poor  Judgement:  Impaired  Insight:  Shallow  Psychomotor Activity:  Decreased  Concentration:  Concentration: Poor and Attention Span: Poor  Recall:  Poor  Fund of Knowledge:  Poor  Language:  Fair  Akathisia:  No  Handed:  Right  AIMS (if indicated):     Assets:  Communication Skills Desire for Improvement Financial Resources/Insurance Housing Resilience Social Support  ADL's:  Intact  Cognition:  WNL  Sleep:  Number of Hours: 3     Treatment Plan Summary: Daily contact with patient to assess and evaluate symptoms and progress in treatment and Medication management     Joyce [name] a 72 y.o.white femalewith bipolar diosrderandend stage renal disease on hemodialysisadmitted for mixed episode, that turned into catatonic depression, who responded favorably to initial ECT treatment.  #bipolar disorder -continue Zyprexa 15 mg nightly -resume ECT on 4/1 if necessary  #Restless legs -restart Requip 2 mg  #Chest pain -cardiology consult is greatly appreciated  -no acute event -continue Tylenol for chest wall pain  #gerd -continue pantprazole 40 mg daily -continue ondansetron PRN  #ESRD -continue dialysis per nephrology   #Deconditioning -PT  consult, patient refused  #Disposition -sister has HCPOA -discharge to home with family? -follow up with Dr. Bryna Colander, MD 10/14/2017, 4:05 PM

## 2017-10-14 NOTE — Progress Notes (Signed)
Patient visited by physical therapy  , who  She refused to work with  This was the second time physical therapy came to patient today

## 2017-10-14 NOTE — BHH Group Notes (Signed)
10/14/2017 1PM  Type of Therapy/Topic:  Group Therapy:  Feelings about Diagnosis  Participation Level:  Did Not Attend   Description of Group:   This group will allow patients to explore their thoughts and feelings about diagnoses they have received. Patients will be guided to explore their level of understanding and acceptance of these diagnoses. Facilitator will encourage patients to process their thoughts and feelings about the reactions of others to their diagnosis and will guide patients in identifying ways to discuss their diagnosis with significant others in their lives. This group will be process-oriented, with patients participating in exploration of their own experiences, giving and receiving support, and processing challenge from other group members.   Therapeutic Goals: 1. Patient will demonstrate understanding of diagnosis as evidenced by identifying two or more symptoms of the disorder 2. Patient will be able to express two feelings regarding the diagnosis 3. Patient will demonstrate their ability to communicate their needs through discussion and/or role play  Summary of Patient Progress: Pt was invited to attend group but chose not to attend. CSW will continue to encourage pt to attend group throughout their admission.     Therapeutic Modalities:   Cognitive Behavioral Therapy Brief Therapy  Alden Hipp, MSW, LCSW 10/14/2017 2:04 PM

## 2017-10-14 NOTE — Progress Notes (Signed)
PT Cancellation Note  Patient Details Name: Yarelly Kuba MRN: [id number] DOB: 1945/08/02   Cancelled Treatment:    Reason Eval/Treat Not Completed: Patient declined, no reason specified; Pt declined to participate with PT services this PM.  Encouragement provided with pt again refusing to participate.  Will attempt to see pt at a future date as medically appropriate.  Linus Salmons PT, DPT 10/14/17, 3:18 PM

## 2017-10-14 NOTE — Progress Notes (Signed)
PT Cancellation Note  Patient Details Name: Joyce [name] MRN: [id number] DOB: 24-Jul-1945   Cancelled Treatment:    Reason Eval/Treat Not Completed: Patient declined, no reason specified; Pt in fetal position in bed with nurse present.  Pt refused to get out of bed or even change position.  Encouragement from nurse and this PT provided but pt continued to refuse to move from her position in bed.  Will attempt to see pt at a future date/time as appropriate.     Linus Salmons PT, DPT 10/14/17, 10:59 AM

## 2017-10-14 NOTE — Progress Notes (Signed)
Requested  Milk crackers and  peanut butter   Sits up periodically and snacks .

## 2017-10-14 NOTE — Progress Notes (Signed)
Resting in bed voice no concerns 1:1 present   Sleeping

## 2017-10-15 DIAGNOSIS — T82868A Thrombosis of vascular prosthetic devices, implants and grafts, initial encounter: Secondary | ICD-10-CM

## 2017-10-15 DIAGNOSIS — N186 End stage renal disease: Secondary | ICD-10-CM

## 2017-10-15 LAB — PHOSPHORUS: Phosphorus: 3.6 mg/dL (ref 2.5–4.6)

## 2017-10-15 MED ORDER — ROPINIROLE HCL 1 MG PO TABS
2.0000 mg | ORAL_TABLET | Freq: Every day | ORAL | Status: DC
  Administered 2017-10-15 – 2017-10-21 (×7): 2 mg via ORAL
  Filled 2017-10-15 (×8): qty 2

## 2017-10-15 MED ORDER — ROPINIROLE HCL ER 8 MG PO TB24: 8.0000 mg | ORAL_TABLET | Freq: Every day | ORAL | Status: DC

## 2017-10-15 MED ORDER — TEMAZEPAM 15 MG PO CAPS
15.0000 mg | ORAL_CAPSULE | Freq: Every day | ORAL | Status: DC
  Administered 2017-10-15: 15 mg via ORAL
  Filled 2017-10-15: qty 1

## 2017-10-15 NOTE — Progress Notes (Signed)
Post HD assessment. Pt tolerated tx well without c/o. Alarm kept going off r/t increased pressures, MD aware. Net UF 1013, goal met.    10/15/17 1504  Vital Signs  Temp (!) 97.3 F (36.3 C)  Temp Source Oral  Pulse Rate 70  Pulse Rate Source Monitor  Resp 18  BP (!) 96/43  BP Location Right Arm  BP Method Automatic  Patient Position (if appropriate) Lying  Oxygen Therapy  SpO2 100 %  O2 Device Room Air  Dialysis Weight  Weight  (no scale available, unable to weigh pt)  Type of Weight Post-Dialysis  Post-Hemodialysis Assessment  Rinseback Volume (mL) 250 mL  KECN 72 V  Dialyzer Clearance Lightly streaked  Duration of HD Treatment -hour(s) 3.5 hour(s)  Hemodialysis Intake (mL) 500 mL  UF Total -Machine (mL) 1513 mL  Net UF (mL) 1013 mL  Tolerated HD Treatment Yes  AVG/AVF Arterial Site Held (minutes) 10 minutes  AVG/AVF Venous Site Held (minutes) 10 minutes  Education / Care Plan  Dialysis Education Provided Yes  Documented Education in Care Plan Yes

## 2017-10-15 NOTE — Progress Notes (Signed)
PT Cancellation Note  Patient Details Name: Joyce [name] MRN: [id number] DOB: 13-Sep-1945   Cancelled Treatment:    Reason Eval/Treat Not Completed: Patient declined PT evaluation secondary to fatigue after HD.  Will attempt to see pt at a future date/time as medically appropriate.     Linus Salmons PT, DPT 10/15/17, 4:09 PM

## 2017-10-15 NOTE — Progress Notes (Signed)
Pre HD assessment   10/15/17 1051  Vital Signs  Temp (!) 97.4 F (36.3 C)  Temp Source Oral  Pulse Rate 67  Pulse Rate Source Monitor  Resp 19  BP 125/61  BP Location Right Arm  BP Method Automatic  Patient Position (if appropriate) Lying  Oxygen Therapy  SpO2 99 %  O2 Device Room Air  Pain Assessment  Pain Scale 0-10  Pain Score 0  Dialysis Weight  Weight  (unable to weigh pt, no scale available, pt in chair)  Type of Weight Pre-Dialysis  Time-Out for Hemodialysis  What Procedure? HD   Pt Identifiers(min of two) First/Last Name;MRN/Account#  Correct Site? Yes  Correct Side? Yes  Correct Procedure? Yes  Consents Verified? Yes  Rad Studies Available? N/A  Safety Precautions Reviewed? Yes  Engineer, civil (consulting) Number  ([id number])  Station Number 1  UF/Alarm Test Passed  Conductivity: Meter 13.6  Conductivity: Machine  13.9  pH 7.4  Reverse Osmosis main  Normal Saline Lot Number [id number]  Dialyzer Lot Number [id number]  Disposable Set Lot Number [id number]  Machine Temperature 98.6 F (37 C)  Musician and Audible Yes  Blood Lines Intact and Secured Yes  Pre Treatment Patient Checks  Vascular access used during treatment Fistula  Patient is receiving dialysis in a chair Yes  Hepatitis B Surface Antigen Results Negative  Date Hepatitis B Surface Antigen Drawn 09/18/17  Hepatitis B Surface Antibody  (<10)  Date Hepatitis B Surface Antibody Drawn 09/18/17  Hemodialysis Consent Verified Yes  Hemodialysis Standing Orders Initiated Yes  ECG (Telemetry) Monitor On Yes  Prime Ordered Normal Saline  Length of  DialysisTreatment -hour(s) 3.5 Hour(s)  Dialyzer Elisio 17H NR  Dialysate 2K, 2.5 Ca  Dialysis Anticoagulant None  Dialysate Flow Ordered 800  Blood Flow Rate Ordered 400 mL/min  Ultrafiltration Goal 1 Liters  Pre Treatment Labs Phosphorus  Dialysis Blood Pressure Support Ordered Normal Saline  Education / Care Plan  Dialysis Education Provided Yes   Documented Education in Care Plan Yes  Fistula / Graft Left Upper arm Arteriovenous fistula  Placement Date: 08/13/15   Placed prior to admission: Yes  Orientation: Left  Access Location: Upper arm  Access Type: Arteriovenous fistula  Site Condition No complications  Fistula / Graft Assessment Present;Thrill;Bruit  Drainage Description None

## 2017-10-15 NOTE — Progress Notes (Signed)
Pre HD assessment   10/15/17 1052  Neurological  Level of Consciousness Alert  Orientation Level Oriented to person;Oriented to situation;Oriented to time  Respiratory  Respiratory Pattern Regular;Unlabored  Chest Assessment Chest expansion symmetrical  Cardiac  ECG Monitor Yes  Vascular  R Radial Pulse +2  L Radial Pulse +2  Integumentary  Integumentary (WDL) X  Skin Color Appropriate for ethnicity  Musculoskeletal  Musculoskeletal (WDL) X  Generalized Weakness Yes  Assistive Device None  GU Assessment  Genitourinary (WDL) X  Genitourinary Symptoms  (HD)  Psychosocial  Psychosocial (WDL) X  Patient Behaviors Cooperative;Restless

## 2017-10-15 NOTE — Progress Notes (Signed)
Post HD assessment   10/15/17 1508  Neurological  Level of Consciousness Alert  Orientation Level Oriented to person;Oriented to time;Oriented to situation  Respiratory  Respiratory Pattern Regular;Unlabored  Chest Assessment Chest expansion symmetrical  Cardiac  ECG Monitor Yes  Vascular  R Radial Pulse +2  L Radial Pulse +2  Integumentary  Integumentary (WDL) X  Skin Color Appropriate for ethnicity  Musculoskeletal  Musculoskeletal (WDL) X  Generalized Weakness Yes  Assistive Device None  GU Assessment  Genitourinary (WDL) X  Genitourinary Symptoms  (HD)  Psychosocial  Psychosocial (WDL) WDL

## 2017-10-15 NOTE — Plan of Care (Signed)
Patient slept for Estimated Hours of 0.45 hours; restless, tossed and turned all night, 1:1 Continuous Observation for safety maintained, room free of safety hazards, patient sustains no injury or falls during this shift.  Problem: Safety: Goal: Ability to remain free from injury will improve Outcome: Progressing   Problem: Self-Concept: Goal: Ability to verbalize positive feelings about self will improve Outcome: Progressing   Problem: Activity: Goal: Ability to tolerate increased activity will improve Outcome: Progressing   Problem: Fluid Volume: Goal: Compliance with measures to maintain balanced fluid volume will improve Outcome: Progressing   Problem: Physical Regulation: Goal: Complications related to the disease process, condition or treatment will be avoided or minimized Outcome: Progressing

## 2017-10-15 NOTE — Progress Notes (Signed)
Hourly rounding 0800 Up to community room eating breakfast.  Safety sitter in place. 0900 Resting in bed with eyes closed.  Safety sitter continued. 1000 Resting in bed with eyes closed.  Safety sitter continued. 1059 Patient restless, complaining of heartburn and chest hurting.  Medicated with tylenol  325 mg and 30 ml maalox.  Safety sitter continued.  32 Transported to dialysis via wheelchair by Air cabin crew.   1100 Off unit in dialysis.  Sitter continued.   1152   Patient in dialysis.  Nurse called and informed that patient is restless.  Ativan 2 mg given  IM  1:1 continued.   1300 Off unit in dialysis.  1:1 continued 1400 Off unit in dialysis.  1:1 continued.   1500 1600 1700 1800 1900

## 2017-10-15 NOTE — [facility] Group Notes (Signed)
LCSW Group Therapy Note 10/15/2017 9:00 AM  Type of Therapy and Topic:  Group Therapy:  Setting Goals  Participation Level:  Did Not Attend  Description of Group: In this process group, patients discussed using strengths to work toward goals and address challenges.  Patients identified two positive things about themselves and one goal they were working on.  Patients were given the opportunity to share openly and support each other's plan for self-empowerment.  The group discussed the value of gratitude and were encouraged to have a daily reflection of positive characteristics or circumstances.  Patients were encouraged to identify a plan to utilize their strengths to work on current challenges and goals.  Therapeutic Goals 1. Patient will verbalize personal strengths/positive qualities and relate how these can assist with achieving desired personal goals 2. Patients will verbalize affirmation of peers plans for personal change and goal setting 3. Patients will explore the value of gratitude and positive focus as related to successful achievement of goals 4. Patients will verbalize a plan for regular reinforcement of personal positive qualities and circumstances.  Summary of Patient Progress:   Shianna was invited to today's group, but chose not to attend.    Therapeutic Modalities Cognitive Behavioral Therapy Motivational Interviewing    Devona Konig, [place] 10/15/2017 11:32 AM

## 2017-10-15 NOTE — Consult Note (Signed)
[facility] Vascular Consult Note  MRN : [id number]  Joyce [name] is a 72 y.o. (01/20/1946) female who presents with chief complaint of No chief complaint on file. Marland Kitchen  History of Present Illness: I am asked to see the patient by Dr. Holley Raring for evaluation of a dysfunctional access.  She had an AV fistula placed previously which has been her dialysis access.  She uses this with each treatment, and over the past several weeks she has had increasing pressures with her dialysis.  This has been associated with prolonged bleeding after removal of the needles as well as significant infiltrations.  She has pain with the infiltrations but not otherwise.  No fever or chills.  No ulcerations or infection.  She was admitted for ECT to treat her psychiatric illness, and issues with dialysis have persisted while she is in the hospital.  We are consulted for further evaluation  Current Facility-Administered Medications  Medication Dose Route Frequency Provider Last Rate Last Dose  . acetaminophen (TYLENOL) tablet 325 mg  325 mg Oral BID PRN Clapacs, [name], MD   325 mg at 10/15/17 0959  . alum & mag hydroxide-simeth (MAALOX/MYLANTA) 200-200-20 MG/5ML suspension 30 mL  30 mL Oral Q4H PRN Clapacs, [name], MD   30 mL at 10/15/17 0959  . bisacodyl (DULCOLAX) EC tablet 5 mg  5 mg Oral Daily PRN Clapacs, [name], MD      . brimonidine (ALPHAGAN) 0.2 % ophthalmic solution 1 drop  1 drop Both Eyes BID Clapacs, [name], MD   1 drop at 10/15/17 0837  . [START ON 10/17/2017] cyanocobalamin ((VITAMIN B-12)) injection 1,000 mcg  1,000 mcg Intramuscular Q30 days Clapacs, [name], MD      . dicyclomine (BENTYL) tablet 20 mg  20 mg Oral TID AC Clapacs, [name], MD   20 mg at 10/14/17 1759  . epoetin alfa (EPOGEN,PROCRIT) injection 4,000 Units  4,000 Units Intravenous Q M,W,F-HD [name], MD   4,000 Units at 10/10/17 1621  . feeding supplement (NEPRO CARB STEADY) liquid 237 mL  237 mL Oral BID BM  [name], MD   237 mL at 10/14/17 1804  . fentaNYL (SUBLIMAZE) injection 25 mcg  25 mcg Intravenous Q5 min PRN [name], MD      . haloperidol (HALDOL) tablet 5 mg  5 mg Oral Q8H PRN [name], [name], MD   5 mg at 10/14/17 0133   Or  . haloperidol lactate (HALDOL) injection 5 mg  5 mg Intramuscular Q8H PRN [name], [name], MD   5 mg at 10/01/17 0908  . lidocaine-prilocaine (EMLA) cream 1 application  1 application Topical Q M,W,F Clapacs, [name], MD   1 application at 40/98/11 1219  . LORazepam (ATIVAN) injection 2 mg  2 mg Intramuscular Q4H PRN Clapacs, [name], MD   2 mg at 10/14/17 0134  . LORazepam (ATIVAN) injection 2 mg  2 mg Intramuscular Q6H PRN Clapacs, [name], MD   2 mg at 10/13/17 1843  . magnesium hydroxide (MILK OF MAGNESIA) suspension 30 mL  30 mL Oral Daily PRN Clapacs, [name], MD      . metoprolol tartrate (LOPRESSOR) tablet 12.5 mg  12.5 mg Oral BID [name], MD   12.5 mg at 10/12/17 1652  . midodrine (PROAMATINE) tablet 10 mg  10 mg Oral TID WC [name], MD   10 mg at 10/15/17 0837  . multivitamin (RENA-VIT) tablet 1 tablet  1 tablet Oral  QHS Orson Slick B, MD   1 tablet at 10/14/17 2148  . OLANZapine zydis (ZYPREXA) disintegrating tablet 15 mg  15 mg Oral QHS [name], [name], MD   15 mg at 10/14/17 2146  . ondansetron (ZOFRAN) injection 4 mg  4 mg Intravenous Once PRN [name], MD      . pantoprazole (PROTONIX) EC tablet 40 mg  40 mg Oral Daily Clapacs, [name], MD   40 mg at 10/12/17 0829  . pentafluoroprop-tetrafluoroeth (GEBAUERS) aerosol   Topical PRN [name], MD      . rOPINIRole (REQUIP) tablet 1 mg  1 mg Oral QHS [name], [name], MD   1 mg at 10/14/17 2150  . timolol (TIMOPTIC) 0.5 % ophthalmic solution 1 drop  1 drop Both Eyes BID Clapacs, [name], MD   1 drop at 10/15/17 0837  . vitamin C (ASCORBIC ACID) tablet 500 mg  500 mg Oral BID [name], [name], MD   500 mg at 10/14/17 1759    Past Medical  History:  Diagnosis Date  . Anal fissure   . Bipolar affective disorder ([place])   . CKD (chronic kidney disease)    Dr Holley Raring [name] 4  . Colon polyps   . Diverticulitis   . Diverticulitis   . Family history of adverse reaction to anesthesia    mom - PONV  . GERD (gastroesophageal reflux disease)   . Headache    migraines - none over 10 yrs  . Heart murmur   . History of hiatal hernia   . Hypertension   . Pancreatitis    Valproic acid  . Vertigo     Past Surgical History:  Procedure Laterality Date  . A/V FISTULAGRAM Left 04/14/2017   Procedure: A/V Fistulagram;  Surgeon: Katha Cabal, MD;  Location: [place] [facility];  Service: Cardiovascular;  Laterality: Left;  . A/V FISTULAGRAM Left 06/09/2017   Procedure: A/V FISTULAGRAM;  Surgeon: Katha Cabal, MD;  Location: [place] [facility];  Service: Cardiovascular;  Laterality: Left;  . ABDOMINAL HYSTERECTOMY  1990 ?  . AV FISTULA PLACEMENT  4/30  . BREAST EXCISIONAL BIOPSY Left 1994   neg surgical bx  . CHOLECYSTECTOMY  2003  . COLONOSCOPY  2014   Dr. Jamal Collin  . COLONOSCOPY WITH PROPOFOL N/A 09/24/2015   Procedure: COLONOSCOPY WITH random colon byopies.;  Surgeon: Lucilla Lame, MD;  Location: [place];  Service: Endoscopy;  Laterality: N/A;  . ESOPHAGOGASTRODUODENOSCOPY (EGD) WITH PROPOFOL N/A 09/24/2015   Procedure: ESOPHAGOGASTRODUODENOSCOPY (EGD) ;  Surgeon: Lucilla Lame, MD;  Location: [place];  Service: Endoscopy;  Laterality: N/A;  . EYE SURGERY    . PERIPHERAL VASCULAR CATHETERIZATION N/A 05/29/2015   Procedure: A/V Shuntogram/Fistulagram;  Surgeon: Katha Cabal, MD;  Location: [facility] [facility];  Service: Cardiovascular;  Laterality: N/A;  . PERIPHERAL VASCULAR CATHETERIZATION N/A 05/29/2015   Procedure: A/V Shunt Intervention;  Surgeon: Katha Cabal, MD;  Location: [place] [facility];  Service: Cardiovascular;  Laterality: N/A;  . POLYPECTOMY  09/24/2015   Procedure:  POLYPECTOMY INTESTINAL;  Surgeon: Lucilla Lame, MD;  Location: [place];  Service: Endoscopy;;  cecal polyp ascending polyp    Social History Social History   Tobacco Use  . Smoking status: Never Smoker  . Smokeless tobacco: Never Used  Substance Use Topics  . Alcohol use: No    Alcohol/week: 0.0 oz  . Drug use: No    Family History Family History  Problem Relation  Age of Onset  . Stroke Father   . Hypertension Father   . Breast cancer Other   . Colon cancer Neg Hx   . Liver disease Neg Hx   no bleeding or clotting disorders  Allergies  Allergen Reactions  . Morphine And Related Shortness Of Breath    Pt reports chest pain and difficulty breathing.  . Indomethacin Hives  . Pollen Extract Other (See Comments)    Sinus problems and HA     REVIEW OF SYSTEMS (Negative unless checked)  Constitutional: [] Weight loss  [] Fever  [] Chills Cardiac: [] Chest pain   [] Chest pressure   [] Palpitations   [] Shortness of breath when laying flat   [] Shortness of breath at rest   [x] Shortness of breath with exertion. Vascular:  [] Pain in legs with walking   [] Pain in legs at rest   [] Pain in legs when laying flat   [] Claudication   [] Pain in feet when walking  [] Pain in feet at rest  [] Pain in feet when laying flat   [] History of DVT   [] Phlebitis   [] Swelling in legs   [] Varicose veins   [] Non-healing ulcers Pulmonary:   [] Uses home oxygen   [] Productive cough   [] Hemoptysis   [] Wheeze  [] COPD   [] Asthma Neurologic:  [] Dizziness  [] Blackouts   [] Seizures   [] History of stroke   [] History of TIA  [] Aphasia   [] Temporary blindness   [] Dysphagia   [] Weakness or numbness in arms   [] Weakness or numbness in legs Musculoskeletal:  [x] Arthritis   [] Joint swelling   [] Joint pain   [] Low back pain Hematologic:  [] Easy bruising  [x] Easy bleeding   [] Hypercoagulable state   [] Anemic  [] Hepatitis Gastrointestinal:  [] Blood in stool   [] Vomiting blood  [x] Gastroesophageal reflux/heartburn    [] Difficulty swallowing. Genitourinary:  [x] Chronic kidney disease   [] Difficult urination  [] Frequent urination  [] Burning with urination   [] Blood in urine Skin:  [] Rashes   [] Ulcers   [] Wounds Psychological:  [x] History of anxiety   [x]  History of major depression.  Physical Examination  Vitals:   10/13/17 2134 10/13/17 2143 10/14/17 0552 10/15/17 0500  BP: (!) 137/48 (!) 113/51 118/75 126/66  Pulse: (!) 58 (!) 59 75 74  Resp: 18 16 18 18   Temp:  (!) 97.5 F (36.4 C) 97.7 F (36.5 C) 98 F (36.7 C)  TempSrc:  Oral Oral Oral  SpO2: 97% 99%  99%  Height:       Body mass index is 26.33 kg/m. Gen:  WD/WN, NAD Head: [place]/AT, No temporalis wasting.  Ear/Nose/Throat: Hearing grossly intact, nares w/o erythema or drainage, oropharynx w/o Erythema/Exudate Eyes: Sclera non-icteric, conjunctiva clear Neck: Trachea midline.    Pulmonary:  Good air movement, respirations not labored, equal bilaterally.  Cardiac: RRR, no JVD Vascular: AV fistula very pulsatile Vessel Right Left  Radial Palpable Palpable                                    Musculoskeletal: M/S 5/5 throughout.  Extremities without ischemic changes.  No deformity or atrophy. No edema. Neurologic: Sensation grossly intact in extremities.  Symmetrical.  Speech is fluent. Motor exam as listed above. Psychiatric: Very flat affect and slow in her motions, but does seem to answer most questions appropriately and seems to have reasonable understanding Dermatologic: No rashes or ulcers noted.  No cellulitis or open wounds.       CBC Lab Results  Component Value Date   WBC 11.3 (H) 10/05/2017   HGB 10.9 (L) 10/09/2017   HCT 32.0 (L) 10/09/2017   MCV 94.8 10/05/2017   PLT 364 10/05/2017    BMET    Component Value Date/Time   NA 137 10/11/2017 1529   NA 139 11/09/2014 1027   K 4.1 10/11/2017 1529   K 4.7 11/09/2014 1027   CL 95 (L) 10/11/2017 1529   CL 108 11/09/2014 1027   CO2 32 10/11/2017 1529   CO2 25  11/09/2014 1027   GLUCOSE 107 (H) 10/11/2017 1529   GLUCOSE 100 (H) 11/09/2014 1027   BUN 26 (H) 10/11/2017 1529   BUN 51 (H) 11/09/2014 1027   CREATININE 4.82 (H) 10/11/2017 1529   CREATININE 2.98 (H) 11/09/2014 1027   CALCIUM 9.1 10/11/2017 1529   CALCIUM 8.9 11/09/2014 1027   GFRNONAA 8 (L) 10/11/2017 1529   GFRNONAA 15 (L) 11/09/2014 1027   GFRAA 10 (L) 10/11/2017 1529   GFRAA 18 (L) 11/09/2014 1027   Estimated Creatinine Clearance: 10.2 mL/min (A) (by C-G formula based on SCr of 4.82 mg/dL (H)).  COAG Lab Results  Component Value Date   INR 1.08 10/03/2016   INR 1.0 11/09/2014    Radiology Dg Chest 2 View  Result Date: 09/24/2017 CLINICAL DATA:  Altered mental status EXAM: CHEST - 2 VIEW COMPARISON:  Chest radiograph 09/01/2016 FINDINGS: Shallow lung inflation with right basilar atelectasis. No other consolidation. No pleural effusion or pneumothorax. Normal cardiomediastinal contours. IMPRESSION: Shallow lung inflation with right basilar atelectasis. Electronically Signed   By: Ulyses Jarred M.D.   On: 09/24/2017 15:51   Dg Ribs Bilateral W/chest  Result Date: 09/27/2017 CLINICAL DATA:  Midsternal chest pain without trauma. EXAM: BILATERAL RIBS AND CHEST - 4+ VIEW COMPARISON:  Chest radiograph 09/24/2017 FINDINGS: Frontal view of the chest and a total of 6 views of bilateral ribs. Frontal view of the chest demonstrates midline trachea. Borderline cardiomegaly. Mediastinal contours otherwise within normal limits. No pleural effusion or pneumothorax. Left axillary vascular stent. No pleural effusion or pneumothorax. Low lung volumes with mild bibasilar atelectasis. Cholecystectomy clips. Rib films demonstrate no displaced rib fracture or focal osseous lesion. IMPRESSION: No acute findings. Electronically Signed   By: Abigail Miyamoto M.D.   On: 09/27/2017 15:18   Ct Head Wo Contrast  Result Date: 09/22/2017 CLINICAL DATA:  72 y/o  F; syncope x2. EXAM: CT HEAD WITHOUT CONTRAST  TECHNIQUE: Contiguous axial images were obtained from the base of the skull through the vertex without intravenous contrast. COMPARISON:  09/01/2017 CT head.  09/02/2016 MRI head. FINDINGS: Brain: No evidence of acute infarction, hemorrhage, hydrocephalus, extra-axial collection or mass lesion/mass effect. Vascular: No hyperdense vessel or unexpected calcification. Skull: Normal. Negative for fracture or focal lesion. Sinuses/Orbits: No acute finding. Other: None. IMPRESSION: Negative CT of the head. Electronically Signed   By: Kristine Garbe M.D.   On: 09/22/2017 20:37      Assessment/Plan 1.  ESRD.  Uses her AV fistula for dialysis and has for some time.  Needs improvement in the function of her fistula. 2.  Dysfunction of dialysis access.  Prolonged bleeding with multiple infiltrations and high pressures with her dialysis.  Suggestive of a venous outflow stenosis.  Fistulogram is certainly indicated for further evaluation 3.  Bipolar disorder/catatonia.  Getting ECT treatments.  Currently hospitalized for this.   Leotis Pain, MD  10/15/2017 10:29 AM    This note was created with Dragon medical transcription system.  Any error  is purely unintentional

## 2017-10-15 NOTE — Plan of Care (Addendum)
2000-Patient found asleep in bed upon my arrival with 1:1 sitter at bedside. 2100-Patient is neither visible nor social this evening. Patient slept through the evening except for a short visit with family members. 1:1 sitter at bedside. 2200-Patient is only minimally verbal during attempted assessment. Denies pain. Compliant with 1:1 sitter and HS medications. Will continue to monitor throughout the shift.  2300-Patient sleeping. Sitter at bedside. 0000-Patient continues to sleep. Patient monitor utilized for 1:1 while awake order. Sleeping soundly. Breathing is even and unlabored. No apparent distress at this time. 0100-Patient continues to sleep. Patient on monitor. 0200-Patient has become restless. 1:1 sitter at bedside. @ 0245, complains of agitation, given Haldol. Will monitor for efficacy. 0300-Patient is able to voice when she requires assistance. Requests chocolate milk and is given Ensure of which she drank 75%. Sitter remains at bedside while awake.  0400-Patient remains awake but slightly less psychomotor agitation. Complains of generalized muscle ache 6/10, given Tylenol. Sitter at bedside. 0500-Patient remains restless and sore. Reports minimal relief with Tylenol and haldol. Patient slept all day and is now asking for "something to sleep." Patient educated regarding the inability for someone to sleep all day and all night. Encouraged patient to attempt to regulate her schedule by staying awake and participating in treatment during the day. Patient states, "okay." Evidence of learning is questionable. Sitter remains at bedside. 3244-@ 0102, patient complains of heartburn. Given Maalox. Will monitor for efficacy. Sitter at bedside. Patient slept 4.25 hours. Very restless. Elevated pulse, 124. Upon recheck, 106. Needy despite 1:1 sitter. Will endorse care to oncoming shift.  Problem: Coping: Goal: Ability to verbalize feelings will improve Outcome: Progressing   Problem: Health  Behavior/Discharge Planning: Goal: Compliance with prescribed medication regimen will improve Outcome: Progressing   Problem: Nutritional: Goal: Ability to achieve adequate nutritional intake will improve Outcome: Progressing   Problem: Activity: Goal: Sleeping patterns will improve Outcome: Progressing   Problem: Self-Concept: Goal: Ability to verbalize positive feelings about self will improve Outcome: Not Progressing   Problem: Activity: Goal: Ability to tolerate increased activity will improve Outcome: Not Progressing

## 2017-10-15 NOTE — Plan of Care (Signed)
  Problem: Education: Goal: Will be free of psychotic symptoms Outcome: Progressing Note:  No signs of psychosis noted Goal: Knowledge of the prescribed therapeutic regimen will improve Outcome: Progressing Note:  Verbalizes understanding of current medication regiment   Problem: Coping: Goal: Ability to verbalize feelings will improve Outcome: Progressing Note:  Verbalizes feelings with any difficulty   Problem: Health Behavior/Discharge Planning: Goal: Compliance with prescribed medication regimen will improve Outcome: Progressing Note:  Medication compliant   Problem: Nutritional: Goal: Ability to achieve adequate nutritional intake will improve Outcome: Progressing   Problem: Safety: Goal: Ability to remain free from injury will improve Outcome: Progressing Note:  Remains safe on unit.  Safety sitter at bedside   Problem: Activity: Goal: Ability to tolerate increased activity will improve Outcome: Progressing

## 2017-10-15 NOTE — Progress Notes (Signed)
HD tx end   10/15/17 1452  Vital Signs  Pulse Rate 69  Pulse Rate Source Monitor  Resp 19  BP (!) 113/45  BP Location Right Arm  BP Method Automatic  Patient Position (if appropriate) Lying  Oxygen Therapy  SpO2 100 %  O2 Device Room Air  During Hemodialysis Assessment  Dialysis Fluid Bolus Normal Saline  Bolus Amount (mL) 250 mL  Intra-Hemodialysis Comments Tx completed  Fistula / Graft Left Upper arm Arteriovenous fistula  Placement Date: 08/13/15   Placed prior to admission: Yes  Orientation: Left  Access Location: Upper arm  Access Type: Arteriovenous fistula  Status Deaccessed

## 2017-10-15 NOTE — Progress Notes (Signed)
Patient ID: Joyce [name], female   DOB: 1946-04-22, 72 y.o.   MRN: [id number] Patient remains on 1:1 continuous observation with a safety Sitter, less restless, slightly calmer and oriented; complied with medication at bedtime; pt son and daughter-in-law [name] & [name]: [phone]) called to check on patient and POC updates. Maalox given at 0048 for indigestion; still tossing and turning; we will continue to monitor.

## 2017-10-15 NOTE — Progress Notes (Signed)
HD tx start    10/15/17 1104  Vital Signs  Pulse Rate 64  Pulse Rate Source Monitor  Resp 19  BP 100/78  BP Location Right Arm  BP Method Automatic  Patient Position (if appropriate) Lying  Oxygen Therapy  SpO2 99 %  O2 Device Room Air  During Hemodialysis Assessment  Blood Flow Rate (mL/min) 400 mL/min  Arterial Pressure (mmHg) -160 mmHg  Venous Pressure (mmHg) 210 mmHg  Transmembrane Pressure (mmHg) 50 mmHg  Ultrafiltration Rate (mL/min) 430 mL/min  Dialysate Flow Rate (mL/min) 800 ml/min  Conductivity: Machine  13.9  HD Safety Checks Performed Yes  Dialysis Fluid Bolus Normal Saline  Bolus Amount (mL) 250 mL  Intra-Hemodialysis Comments Tx initiated  Fistula / Graft Left Upper arm Arteriovenous fistula  Placement Date: 08/13/15   Placed prior to admission: Yes  Orientation: Left  Access Location: Upper arm  Access Type: Arteriovenous fistula  Status Accessed  Needle Size 15

## 2017-10-15 NOTE — Progress Notes (Signed)
  Hourly Rounds  1500 Patient remains in dialysis unit  Sitting in chair for her procedure . Writer at patient side. 1600 Patient returned to room  Sleeping  In bed  Writer  Present  At bedside  [place] all on tray  Spoke with MD Hydrologist at bedside  1700 Resting in bed  Voice no concerns  Writer present    1800 Resting in bed  Voice no concerns  Writer present patient visted by sister     1900Resting in bed  Voice no concerns 1:1 present

## 2017-10-15 NOTE — Progress Notes (Signed)
[facility]  ROUNDING NOTE   Subjective:  Patient seen on the inpatient psychiatric unit today. She is resting comfortably at the moment. Patient due for dialysis today. Vascular surgery to evaluate her access soon as well.   Objective:  Vital signs in last 24 hours:  Temp:  [98 F (36.7 C)] 98 F (36.7 C) (03/28 0500) Pulse Rate:  [74] 74 (03/28 0500) Resp:  [18] 18 (03/28 0500) BP: (126)/(66) 126/66 (03/28 0500) SpO2:  [99 %] 99 % (03/28 0500)  Weight change:  Filed Weights    Intake/Output: I/O last 3 completed shifts: In: 1320 [P.O.:1320] Out: 104 [Other:104]   Intake/Output this shift:  Total I/O In: 600 [P.O.:600] Out: -   Physical Exam: General: NAD, laying in bed  Head: Normocephalic, atraumatic. Hearing intact  Eyes: Anicteric  Neck: Supple, trachea midline  Lungs:  Clear to auscultation  Heart: Regular rate and rhythm  Abdomen:  Soft, nontender  Extremities: no peripheral edema.  Psych:  Flat affect. Slow to respond  Neurologic: Nonfocal, moving all four extremities  Skin: No lesions  Access: Left AVF, good bruit    Basic Metabolic Panel: Recent Labs  Lab 10/09/17 0954 10/10/17 1620 10/11/17 1529 10/13/17 2206  NA 140 140 137  --   K 3.7 3.8 4.1  --   CL  --  101 95*  --   CO2  --  29 32  --   GLUCOSE 106* 93 107*  --   BUN  --  25* 26*  --   CREATININE  --  4.18* 4.82*  --   CALCIUM  --  8.6* 9.1  --   PHOS  --   --   --  3.5    Liver Function Tests: Recent Labs  Lab 10/11/17 1529  AST 23  ALT 16  ALKPHOS 84  BILITOT 0.6  PROT 6.7  ALBUMIN 3.5   No results for input(s): LIPASE, AMYLASE in the last 168 hours. No results for input(s): AMMONIA in the last 168 hours.  CBC: Recent Labs  Lab 10/09/17 0954  HGB 10.9*  HCT 32.0*    Cardiac Enzymes: Recent Labs  Lab 10/11/17 1529  CKMB 1.4  TROPONINI 0.03*    BNP: Invalid input(s): POCBNP  CBG: Recent Labs  Lab 10/09/17 0539  JQBHAL 93     Microbiology: Results for orders placed or performed during the hospital encounter of 09/01/17  Urine culture     Status: Abnormal   Collection Time: 09/01/17  4:32 PM  Result Value Ref Range Status   Specimen Description   Final    URINE, RANDOM Performed at [facility], [address], [place], [place] [address]    Special Requests   Final    NONE Performed at [facility], [address] [place], [place] [address]    Culture MULTIPLE SPECIES PRESENT, SUGGEST RECOLLECTION (A)  Final   Report Status 09/03/2017 FINAL  Final  Culture, blood (routine x 2)     Status: None   Collection Time: 09/01/17  6:05 PM  Result Value Ref Range Status   Specimen Description BLOOD RIGHT HAND  Final   Special Requests   Final    BOTTLES DRAWN AEROBIC AND ANAEROBIC Blood Culture results may not be optimal due to an excessive volume of blood received in culture bottles   Culture   Final    NO GROWTH 5 DAYS Performed at [facility], [address], [place], [place] [address]  Report Status 09/06/2017 FINAL  Final  Culture, blood (routine x 2)     Status: None   Collection Time: 09/01/17  6:34 PM  Result Value Ref Range Status   Specimen Description BLOOD RAC  Final   Special Requests   Final    BOTTLES DRAWN AEROBIC AND ANAEROBIC Blood Culture adequate volume   Culture   Final    NO GROWTH 5 DAYS Performed at [facility], [address] [place], [place] [address]    Report Status 09/06/2017 FINAL  Final    Coagulation Studies: No results for input(s): LABPROT, INR in the last 72 hours.  Urinalysis: No results for input(s): COLORURINE, LABSPEC, PHURINE, GLUCOSEU, HGBUR, BILIRUBINUR, KETONESUR, PROTEINUR, UROBILINOGEN, NITRITE, LEUKOCYTESUR in the last 72 hours.  Invalid input(s): APPERANCEUR    Imaging: No results found.   Medications:    . brimonidine  1 drop Both Eyes BID  . [START ON 10/17/2017] cyanocobalamin  1,000 mcg  Intramuscular Q30 days  . dicyclomine  20 mg Oral TID AC  . epoetin (EPOGEN/PROCRIT) injection  4,000 Units Intravenous Q M,W,F-HD  . feeding supplement (NEPRO CARB STEADY)  237 mL Oral BID BM  . lidocaine-prilocaine  1 application Topical Q M,W,F  . metoprolol tartrate  12.5 mg Oral BID  . midodrine  10 mg Oral TID WC  . multivitamin  1 tablet Oral QHS  . OLANZapine zydis  15 mg Oral QHS  . pantoprazole  40 mg Oral Daily  . rOPINIRole  1 mg Oral QHS  . timolol  1 drop Both Eyes BID  . vitamin C  500 mg Oral BID   acetaminophen, alum & mag hydroxide-simeth, bisacodyl, fentaNYL (SUBLIMAZE) injection, haloperidol **OR** haloperidol lactate, LORazepam, LORazepam, magnesium hydroxide, ondansetron (ZOFRAN) IV, pentafluoroprop-tetrafluoroeth  Assessment/ Plan:  [name] is a 72 y.o. white female with end stage renal disease on hemodialysis, diverticulosis, hypotension, bipolar disorder, polycystic kidney disease   [facility]/MWF  1.  ESRD on HD Patient due for hemodialysis today.  We have switched her to Tuesday, Thursday, Saturday schedule while here however as an outpatient she dialyzes on Monday, Wednesday, Friday.  2.  Anemia of CKD: Maintain the patient on Epogen 4000 units IV with dialysis.  3.  Secondary hyperparathyroidism:  Recheck serum phosphorus today.  Currently not on binders.  4. Hypotension:  Maintain the patient on midodrine to help maintain blood pressure.  5. Delirium, bipolar affective disorder, major depression Psychiatry evaluation ongoing Patient was receiving ECT last week.   6. Bradycardia With HR in low 50's Will decrease the dose of lopressor to 12.5 BID   LOS: 28 Luvada Salamone 3/28/20199:33 AM

## 2017-10-15 NOTE — B[facility] Group Notes (Signed)
10/15/2017  Time: 1PM  Type of Therapy/Topic:  Group Therapy:  Balance in Life  Participation Level:  Did Not Attend  Description of Group:   This group will address the concept of balance and how it feels and looks when one is unbalanced. Patients will be encouraged to process areas in their lives that are out of balance and identify reasons for remaining unbalanced. Facilitators will guide patients in utilizing problem-solving interventions to address and correct the stressor making their life unbalanced. Understanding and applying boundaries will be explored and addressed for obtaining and maintaining a balanced life. Patients will be encouraged to explore ways to assertively make their unbalanced needs known to significant others in their lives, using other group members and facilitator for support and feedback.  Therapeutic Goals: 1. Patient will identify two or more emotions or situations they have that consume much of in their lives. 2. Patient will identify signs/triggers that life has become out of balance:  3. Patient will identify two ways to set boundaries in order to achieve balance in their lives:  4. Patient will demonstrate ability to communicate their needs through discussion and/or role plays  Summary of Patient Progress: Pt was invited to attend group but chose not to attend. CSW will continue to encourage pt to attend group throughout their admission.   Therapeutic Modalities:   Cognitive Behavioral Therapy Solution-Focused Therapy Assertiveness Training  Alden Hipp, MSW, LCSW 10/15/2017 2:17 PM

## 2017-10-15 NOTE — Progress Notes (Addendum)
[facility] MD Progress Note  10/15/2017 2:32 PM Joyce [name]  MRN:  [id number]  Subjective:  Ms. Kochanowski slept less than 1 hour last night and feels tired. Still complains of restless legs. Agreed to dialysis today but complains that it is too painful. In the future, she will go for dialysis in her bed to improve comfort. Mood is slightly better. She is asking to be discharge on Friday..   Treatment plan. We will continue Zyprexa zydis 15 mg nightly. We will try Requip ER.   Social/disposition. TBE. She lives independently but unlikely to be able to continue. Follow up with Dr. Thurmond Butts.   Principal Problem: Bipolar affective disorder, current episode manic with psychotic symptoms ([place]) Diagnosis:   Patient Active Problem List   Diagnosis Date Noted  . Bipolar affective disorder, current episode depressed with psychotic symptoms ([place]) [F31.2] 09/17/2017    Priority: High  . Palliative care encounter [Z51.5]   . Bipolar I disorder, most recent episode (or current) manic ([place]) [F31.10] 09/02/2017  . Acute delirium [R41.0] 09/02/2017  . Altered mental status [R41.82]   . Acute encephalopathy [G93.40] 09/01/2017  . ESRD on dialysis ([place]) [N18.6, Z99.2] 04/06/2017  . Complication of vascular access for dialysis [T82.9XXA] 04/06/2017  . Hematuria [R31.9] 12/18/2016  . Polycystic kidney [Q61.3] 10/12/2016  . Ruptured cyst of kidney [Q61.00] 10/03/2016  . HTN (hypertension) [I10] 10/01/2016  . Chronic kidney disease [N18.9] 10/01/2016  . Anemia associated with chronic renal failure [D63.1] 09/24/2016  . Acute kidney insufficiency [N28.9] 07/28/2016  . Noninfectious diarrhea [K52.9]   . Benign neoplasm of cecum [D12.0]   . Benign neoplasm of ascending colon [D12.2]   . Diarrhea [R19.7]   . Nausea [R11.0]   . Gastric polyp [K31.7]   . Chronic constipation [K59.09] 12/27/2014  . Nausea with vomiting [R11.2] 12/27/2014  . Left sided abdominal pain [R10.9] 12/27/2014  . Personal history of colonic  polyps [Z86.010] 05/10/2013   Total Time spent with patient: 15 minutes  Past Psychiatric History: bipoler disorder.  Past Medical History:  Past Medical History:  Diagnosis Date  . Anal fissure   . Bipolar affective disorder ([place])   . CKD (chronic kidney disease)    Dr Holley Raring [name] 4  . Colon polyps   . Diverticulitis   . Diverticulitis   . Family history of adverse reaction to anesthesia    mom - PONV  . GERD (gastroesophageal reflux disease)   . Headache    migraines - none over 10 yrs  . Heart murmur   . History of hiatal hernia   . Hypertension   . Pancreatitis    Valproic acid  . Vertigo     Past Surgical History:  Procedure Laterality Date  . A/V FISTULAGRAM Left 04/14/2017   Procedure: A/V Fistulagram;  Surgeon: Katha Cabal, MD;  Location: [facility];  Service: Cardiovascular;  Laterality: Left;  . A/V FISTULAGRAM Left 06/09/2017   Procedure: A/V FISTULAGRAM;  Surgeon: Katha Cabal, MD;  Location: [facility];  Service: Cardiovascular;  Laterality: Left;  . ABDOMINAL HYSTERECTOMY  1990 ?  . AV FISTULA PLACEMENT  4/30  . BREAST EXCISIONAL BIOPSY Left 1994   neg surgical bx  . CHOLECYSTECTOMY  2003  . COLONOSCOPY  2014   Dr. Jamal Collin  . COLONOSCOPY WITH PROPOFOL N/A 09/24/2015   Procedure: COLONOSCOPY WITH random colon byopies.;  Surgeon: Lucilla Lame, MD;  Location: [facility];  Service: Endoscopy;  Laterality: N/A;  . ESOPHAGOGASTRODUODENOSCOPY (EGD)  WITH PROPOFOL N/A 09/24/2015   Procedure: ESOPHAGOGASTRODUODENOSCOPY (EGD) ;  Surgeon: Lucilla Lame, MD;  Location: [facility];  Service: Endoscopy;  Laterality: N/A;  . EYE SURGERY    . PERIPHERAL VASCULAR CATHETERIZATION N/A 05/29/2015   Procedure: A/V Shuntogram/Fistulagram;  Surgeon: Katha Cabal, MD;  Location: [facility];  Service: Cardiovascular;  Laterality: N/A;  . PERIPHERAL VASCULAR CATHETERIZATION N/A 05/29/2015   Procedure: A/V Shunt Intervention;   Surgeon: Katha Cabal, MD;  Location: [facility];  Service: Cardiovascular;  Laterality: N/A;  . POLYPECTOMY  09/24/2015   Procedure: POLYPECTOMY INTESTINAL;  Surgeon: Lucilla Lame, MD;  Location: [facility];  Service: Endoscopy;;  cecal polyp ascending polyp   Family History:  Family History  Problem Relation Age of Onset  . Stroke Father   . Hypertension Father   . Breast cancer Other   . Colon cancer Neg Hx   . Liver disease Neg Hx    Family Psychiatric  History: none Social History:  Social History   Substance and Sexual Activity  Alcohol Use No  . Alcohol/week: 0.0 oz     Social History   Substance and Sexual Activity  Drug Use No    Social History   Socioeconomic History  . Marital status: Single    Spouse name: Not on file  . Number of children: 1  . Years of education: Not on file  . Highest education level: Not on file  Occupational History  . Occupation: Surveyor, quantity: [employer]  Social Needs  . Financial resource strain: Not on file  . Food insecurity:    Worry: Not on file    Inability: Not on file  . Transportation needs:    Medical: Not on file    Non-medical: Not on file  Tobacco Use  . Smoking status: Never Smoker  . Smokeless tobacco: Never Used  Substance and Sexual Activity  . Alcohol use: No    Alcohol/week: 0.0 oz  . Drug use: No  . Sexual activity: Not Currently  Lifestyle  . Physical activity:    Days per week: Not on file    Minutes per session: Not on file  . Stress: Not on file  Relationships  . Social connections:    Talks on phone: Not on file    Gets together: Not on file    Attends religious service: Not on file    Active member of club or organization: Not on file    Attends meetings of clubs or organizations: Not on file    Relationship status: Not on file  Other Topics Concern  . 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   Additional Social History:    History of alcohol / drug use?: No history of alcohol / drug abuse                    Sleep: Poor  Appetite:  Poor  Current Medications: Current Facility-Administered Medications  Medication Dose Route Frequency Provider Last Rate Last Dose  . acetaminophen (TYLENOL) tablet 325 mg  325 mg Oral BID PRN Clapacs, [name], MD   325 mg at 10/15/17 0959  . alum & mag hydroxide-simeth (MAALOX/MYLANTA) 200-200-20 MG/5ML suspension 30 mL  30 mL Oral Q4H PRN Clapacs, [name], MD   30 mL at 10/15/17 0959  . bisacodyl (DULCOLAX) EC tablet  5 mg  5 mg Oral Daily PRN Clapacs, [name], MD      . brimonidine (ALPHAGAN) 0.2 % ophthalmic solution 1 drop  1 drop Both Eyes BID Clapacs, [name], MD   1 drop at 10/15/17 0837  . [START ON 10/17/2017] cyanocobalamin ((VITAMIN B-12)) injection 1,000 mcg  1,000 mcg Intramuscular Q30 days Clapacs, [name], MD      . dicyclomine (BENTYL) tablet 20 mg  20 mg Oral TID AC Clapacs, [name], MD   20 mg at 10/14/17 1759  . epoetin alfa (EPOGEN,PROCRIT) injection 4,000 Units  4,000 Units Intravenous Q M,W,F-HD [name], MD   4,000 Units at 10/15/17 1400  . feeding supplement (NEPRO CARB STEADY) liquid 237 mL  237 mL Oral BID BM [name], MD   237 mL at 10/14/17 1804  . fentaNYL (SUBLIMAZE) injection 25 mcg  25 mcg Intravenous Q5 min PRN [name], MD      . haloperidol (HALDOL) tablet 5 mg  5 mg Oral Q8H PRN Marlon Suleiman B, MD   5 mg at 10/14/17 0133   Or  . haloperidol lactate (HALDOL) injection 5 mg  5 mg Intramuscular Q8H PRN Alcus Bradly B, MD   5 mg at 10/01/17 0908  . lidocaine-prilocaine (EMLA) cream 1 application  1 application Topical Q M,W,F Clapacs, [name], MD   1 application at 95/09/32 1219  . LORazepam (ATIVAN) injection 2 mg  2 mg Intramuscular Q4H PRN Clapacs, [name], MD   2 mg at 10/14/17 0134  . LORazepam (ATIVAN) injection 2 mg  2 mg Intramuscular Q6H PRN Clapacs,  [name], MD   2 mg at 10/15/17 1152  . magnesium hydroxide (MILK OF MAGNESIA) suspension 30 mL  30 mL Oral Daily PRN Clapacs, [name], MD      . metoprolol tartrate (LOPRESSOR) tablet 12.5 mg  12.5 mg Oral BID [name], MD   12.5 mg at 10/12/17 1652  . midodrine (PROAMATINE) tablet 10 mg  10 mg Oral TID WC [name], MD   10 mg at 10/15/17 0837  . multivitamin (RENA-VIT) tablet 1 tablet  1 tablet Oral QHS Kaulin Chaves B, MD   1 tablet at 10/14/17 2148  . OLANZapine zydis (ZYPREXA) disintegrating tablet 15 mg  15 mg Oral QHS Michole Lecuyer B, MD   15 mg at 10/14/17 2146  . ondansetron (ZOFRAN) injection 4 mg  4 mg Intravenous Once PRN [name], MD      . pantoprazole (PROTONIX) EC tablet 40 mg  40 mg Oral Daily Clapacs, [name], MD   40 mg at 10/12/17 0829  . pentafluoroprop-tetrafluoroeth (GEBAUERS) aerosol   Topical PRN [name], MD      . rOPINIRole (REQUIP) tablet 1 mg  1 mg Oral QHS Finleigh Cheong B, MD   1 mg at 10/14/17 2150  . timolol (TIMOPTIC) 0.5 % ophthalmic solution 1 drop  1 drop Both Eyes BID Clapacs, [name], MD   1 drop at 10/15/17 0837  . vitamin C (ASCORBIC ACID) tablet 500 mg  500 mg Oral BID Delia Sitar B, MD   500 mg at 10/14/17 1759    Lab Results:  Results for orders placed or performed during the hospital encounter of 09/17/17 (from the past 48 hour(s))  Phosphorus     Status: None   Collection Time: 10/13/17 10:06 PM  Result Value Ref Range   Phosphorus 3.5 2.5 - 4.6 mg/dL    Comment: Performed at [facility]  Lab, [place], [place] [address]  Phosphorus     Status: None   Collection Time: 10/15/17 11:55 AM  Result Value Ref Range   Phosphorus 3.6 2.5 - 4.6 mg/dL    Comment: Performed at [facility], [address] [place], [place] [address]    Blood Alcohol level:  Lab Results  Component Value Date   [facility] <10 72/53/6644    Metabolic Disorder Labs: Lab Results  Component Value  Date   HGBA1C 4.8 09/18/2017   MPG 91.06 09/18/2017   No results found for: PROLACTIN Lab Results  Component Value Date   CHOL 251 (H) 09/18/2017   TRIG 124 09/18/2017   HDL 66 09/18/2017   CHOLHDL 3.8 09/18/2017   VLDL 25 09/18/2017   LDLCALC 160 (H) 09/18/2017   LDLCALC 43 05/14/2014    Physical Findings: AIMS: Facial and Oral Movements Muscles of Facial Expression: None, normal Lips and Perioral Area: None, normal Jaw: None, normal Tongue: None, normal,Extremity Movements Upper (arms, wrists, hands, fingers): None, normal Lower (legs, knees, ankles, toes): None, normal, Trunk Movements Neck, shoulders, hips: None, normal, Overall Severity Severity of abnormal movements (highest score from questions above): None, normal Incapacitation due to abnormal movements: None, normal Patient's awareness of abnormal movements (rate only patient's report): No Awareness, Dental Status Current problems with teeth and/or dentures?: No Does patient usually wear dentures?: No  CIWA:    COWS:     Musculoskeletal: Strength & Muscle Tone: within normal limits Gait & Station: normal Patient leans: N/A  Psychiatric Specialty Exam: Physical Exam  Nursing note and vitals reviewed. Psychiatric: Her speech is normal. Her affect is labile. She is withdrawn. Thought content is paranoid. Cognition and memory are impaired. She expresses impulsivity.    Review of Systems  Neurological: Negative.   Psychiatric/Behavioral: Positive for depression. The patient has insomnia.   All other systems reviewed and are negative.   Blood pressure (!) 102/51, pulse 62, temperature (!) 97.4 F (36.3 C), temperature source Oral, resp. rate 17, height 5' 3.5" (1.613 m), weight 68.5 kg (151 lb), SpO2 100 %.Body mass index is 26.33 kg/m.  General Appearance: Fairly Groomed  Eye Contact:  Good  Speech:  Clear and Coherent  Volume:  Normal  Mood:  Irritable  Affect:  Congruent  Thought Process:  Goal Directed  and Descriptions of Associations: Intact  Orientation:  Full (Time, Place, and Person)  Thought Content:  Delusions and Paranoid Ideation  Suicidal Thoughts:  No  Homicidal Thoughts:  No  Memory:  Immediate;   Fair Recent;   Fair Remote;   Fair  Judgement:  Poor  Insight:  Lacking  Psychomotor Activity:  Psychomotor Retardation  Concentration:  Concentration: Fair and Attention Span: Fair  Recall:  [employer] of Knowledge:  Fair  Language:  Fair  Akathisia:  No  Handed:  Right  AIMS (if indicated):     Assets:  Communication Skills Desire for Improvement Financial Resources/Insurance Housing Resilience Social Support  ADL's:  Intact  Cognition:  WNL  Sleep:  Number of Hours: 0.75     Treatment Plan Summary: Daily contact with patient to assess and evaluate symptoms and progress in treatment and Medication management   Ms.Dahlila Pfahler Bowesis a 72 y.o.white femalewith bipolar diosrderandend stage renal disease on hemodialysisadmitted for mixed episode, that turned into catatonic depression, who responded favorably to initial ECT treatment.  #bipolar disorder -continue Zyprexa 15 mg nightly -resume ECT on 4/1 if necessary -restart Restoril 15 mg  nightly for insomnia  #Restless legs -Requip ER 8 mg nightly  #Chest pain -cardiology consult is greatly appreciated  -no acute event -continue Tylenol for chest wall pain  #gerd -continue pantprazole40 mg daily -continue ondansetronPRN  #ESRD -continue dialysis per nephrology -needs improved access, VS input is appreciated  #Deconditioning -PT consult, patient refused  #Disposition -sister has HCPOA -discharge to home with family? -follow up with Dr. Bryna Colander, MD 10/15/2017, 2:32 PM

## 2017-10-16 ENCOUNTER — Encounter: Admission: AD | Disposition: A | Discharge status: Home / Self Care | Payer: Self-pay | Attending: Psychiatry

## 2017-10-16 SURGERY — A/V FISTULAGRAM
Anesthesia: Moderate Sedation | Laterality: Left

## 2017-10-16 MED ORDER — LORAZEPAM 2 MG PO TABS
2.0000 mg | ORAL_TABLET | Freq: Every day | ORAL | Status: DC
  Administered 2017-10-16 – 2017-10-21 (×6): 2 mg via ORAL
  Filled 2017-10-16 (×6): qty 1

## 2017-10-16 MED ORDER — CEFAZOLIN SODIUM-DEXTROSE 2-4 GM/100ML-% IV SOLN
2.0000 g | INTRAVENOUS | Status: DC
  Filled 2017-10-16: qty 100

## 2017-10-16 MED ORDER — SODIUM CHLORIDE 0.9 % IV SOLN
INTRAVENOUS | Status: DC
  Administered 2017-10-19 (×2): via INTRAVENOUS

## 2017-10-16 NOTE — Progress Notes (Signed)
Patient ate lunch at approx. 12 today. Per Dr. Lucky Cowboy, cancel procedure.

## 2017-10-16 NOTE — Progress Notes (Signed)
Hour[place] Rounding  0800: Patient is asleep in her room with her assigned MHT.  0900: Patient is laying in bed with her assigned MHT.  1000: Patient is asleep in her bed with her assigned MHT.  1100: Patient is asleep in her room with her assigned MHT.  1200: Patient is awake in bed with her assigned MHT.  1300: Patient is asleep in her room with her assigned MHT.  1400: Patient is laying in bed in her room with her assigned MHT.  1500: Patient is off of the unit with her assigned MHT.  1600: Patient is awake in her room with her assigned MHT.  1700: Patient is in her room with her assigned MHT.  1800: Patient is asleep in her room with assigned MHT.  1900: Patient is asleep in her room with her assigned sitter.

## 2017-10-16 NOTE — Progress Notes (Signed)
Patient make the statement that her chest is still hurting and would like a chest X-ray. This Probation officer notified MD of patient's request.

## 2017-10-16 NOTE — Progress Notes (Signed)
Patient in room, sitter at bedside. Alert and oriented and able to communicate her needs. Has no concern at this moment. Safety maintained on 1:1.

## 2017-10-16 NOTE — Tx Team (Signed)
Interdisciplinary Treatment and Diagnostic Plan Update  10/16/2017 Time of Session: 10:55 am Joyce [name] MRN: [id number]  Principal Diagnosis: Bipolar affective disorder, current episode manic with psychotic symptoms ([name])  Secondary Diagnoses: Principal Problem:   Bipolar affective disorder, current episode depressed with psychotic symptoms ([name]) Active Problems:   ESRD on dialysis [facility])   Palliative care encounter   Current Medications:  Current Facility-Administered Medications  Medication Dose Route Frequency Provider Last Rate Last Dose  . 0.9 %  sodium chloride infusion   Intravenous Continuous Algernon Huxley, MD      . acetaminophen (TYLENOL) tablet 325 mg  325 mg Oral BID PRN Clapacs, [name], MD   325 mg at 10/16/17 0411  . alum & mag hydroxide-simeth (MAALOX/MYLANTA) 200-200-20 MG/5ML suspension 30 mL  30 mL Oral Q4H PRN Clapacs, [name], MD   30 mL at 10/16/17 0547  . bisacodyl (DULCOLAX) EC tablet 5 mg  5 mg Oral Daily PRN Clapacs, [name], MD      . brimonidine (ALPHAGAN) 0.2 % ophthalmic solution 1 drop  1 drop Both Eyes BID Clapacs, [name], MD   1 drop at 10/16/17 0927  . ceFAZolin (ANCEF) IVPB 2g/100 mL premix  2 g Intravenous 30 min Pre-Op Algernon Huxley, MD      . Derrill Memo ON 10/17/2017] cyanocobalamin ((VITAMIN B-12)) injection 1,000 mcg  1,000 mcg Intramuscular Q30 days Clapacs, [name], MD      . dicyclomine (BENTYL) tablet 20 mg  20 mg Oral TID AC Clapacs, [name], MD   20 mg at 10/16/17 1227  . epoetin alfa (EPOGEN,PROCRIT) injection 4,000 Units  4,000 Units Intravenous Q M,W,F-HD [name], MD   4,000 Units at 10/15/17 1400  . feeding supplement (NEPRO CARB STEADY) liquid 237 mL  237 mL Oral BID BM Singh, [name], MD   237 mL at 10/16/17 1100  . fentaNYL (SUBLIMAZE) injection 25 mcg  25 mcg Intravenous Q5 min PRN [name], MD      . haloperidol (HALDOL) tablet 5 mg  5 mg Oral Q8H PRN [name], [name], MD   5 mg at 10/16/17 0242   Or  . haloperidol  lactate (HALDOL) injection 5 mg  5 mg Intramuscular Q8H PRN [name], [name], MD   5 mg at 10/01/17 0908  . lidocaine-prilocaine (EMLA) cream 1 application  1 application Topical Q M,W,F Clapacs, [name], MD   1 application at 78/29/56 1219  . LORazepam (ATIVAN) injection 2 mg  2 mg Intramuscular Q4H PRN Clapacs, [name], MD   2 mg at 10/14/17 0134  . LORazepam (ATIVAN) injection 2 mg  2 mg Intramuscular Q6H PRN Clapacs, [name], MD   2 mg at 10/15/17 1152  . magnesium hydroxide (MILK OF MAGNESIA) suspension 30 mL  30 mL Oral Daily PRN Clapacs, [name], MD      . metoprolol tartrate (LOPRESSOR) tablet 12.5 mg  12.5 mg Oral BID [name], MD   12.5 mg at 10/16/17 0925  . midodrine (PROAMATINE) tablet 10 mg  10 mg Oral TID WC [name], MD   10 mg at 10/16/17 1227  . multivitamin (RENA-VIT) tablet 1 tablet  1 tablet Oral QHS [name], [name], MD   1 tablet at 10/15/17 2130  . OLANZapine zydis (ZYPREXA) disintegrating tablet 15 mg  15 mg Oral QHS [name], [name], MD   15 mg at 10/15/17 2130  . ondansetron (ZOFRAN) injection 4 mg  4 mg Intravenous Once PRN  [name], MD      . pantoprazole (PROTONIX) EC tablet 40 mg  40 mg Oral Daily Clapacs, [name], MD   40 mg at 10/16/17 0925  . pentafluoroprop-tetrafluoroeth (GEBAUERS) aerosol   Topical PRN [name], MD      . rOPINIRole (REQUIP) tablet 2 mg  2 mg Oral QHS [name], [name], MD   2 mg at 10/15/17 2130  . temazepam (RESTORIL) capsule 15 mg  15 mg Oral QHS [name], [name], MD   15 mg at 10/15/17 2130  . timolol (TIMOPTIC) 0.5 % ophthalmic solution 1 drop  1 drop Both Eyes BID Clapacs, [name], MD   1 drop at 10/16/17 0927  . vitamin C (ASCORBIC ACID) tablet 500 mg  500 mg Oral BID [name], [name], MD   500 mg at 10/16/17 2836   PTA Medications: Medications Prior to Admission  Medication Sig Dispense Refill Last Dose  . acetaminophen (TYLENOL) 325 MG tablet Take 325 mg 2 (two) times daily as needed by  mouth for moderate pain or headache.    10/06/2017 at Unknown time  . brimonidine (ALPHAGAN) 0.2 % ophthalmic solution Place 2 (two) times daily into both eyes.   10/06/2017 at Unknown time  . carbamazepine (TEGRETOL) 200 MG tablet Take 200 mg by mouth 2 (two) times daily.   10/06/2017 at Unknown time  . cephALEXin (KEFLEX) 500 MG capsule Take 1 capsule (500 mg total) by mouth every 12 (twelve) hours. 5 capsule 0 10/06/2017 at Unknown time  . cholecalciferol (VITAMIN D) 1000 UNITS tablet Take 1,000 Units by mouth daily.   10/06/2017 at Unknown time  . cyanocobalamin (,VITAMIN B-12,) 1000 MCG/ML injection Inject 1,000 mcg into the muscle every 30 (thirty) days.   10/06/2017 at Unknown time  . dicyclomine (BENTYL) 20 MG tablet Take 1 tablet (20 mg total) by mouth 3 (three) times daily before meals. 30 tablet 0 10/06/2017 at Unknown time  . lidocaine-prilocaine (EMLA) cream Apply 1 application every Monday, Wednesday, and Friday topically. At dialysis  3 10/06/2017 at Unknown time  . midodrine (PROAMATINE) 10 MG tablet Take 10 mg by mouth daily.    10/06/2017 at Unknown time  . pantoprazole (PROTONIX) 40 MG tablet TAKE 1 TABLET EVERY DAY 90 tablet 3 10/06/2017 at Unknown time  . pramipexole (MIRAPEX) 0.125 MG tablet Take 0.125 mg by mouth daily at 2 PM.  3 10/06/2017 at Unknown time  . QUEtiapine (SEROQUEL) 25 MG tablet Take 50 mg by mouth at bedtime.    10/01/2017  . timolol (TIMOPTIC) 0.5 % ophthalmic solution Place 1 drop 2 (two) times daily into both eyes.    10/06/2017 at Unknown time  . ARIPiprazole (ABILIFY) 2 MG tablet Take 2 mg by mouth daily.    at Unknown time  . bisacodyl (DULCOLAX) 5 MG EC tablet Take 1 tablet (5 mg total) by mouth daily as needed for moderate constipation. 30 tablet 0  at Unknown time    Patient Stressors: Health problems  Patient Strengths: Ability for insight Active sense of humor Average or above average intelligence Capable of independent living Horticulturist, commercial fund of knowledge Supportive family/friends  Treatment Modalities: Medication Management, Group therapy, Case management,  1 to 1 session with clinician, Psychoeducation, Recreational therapy.   Physician Treatment Plan for Primary Diagnosis: Bipolar affective disorder, current episode manic with psychotic symptoms ([place]) Long Term Goal(s): Improvement in symptoms so as ready for discharge NA   Short Term Goals: Ability to  identify changes in lifestyle to reduce recurrence of condition will improve Ability to verbalize feelings will improve Ability to disclose and discuss suicidal ideas Ability to demonstrate self-control will improve Ability to identify and develop effective coping behaviors will improve Ability to identify triggers associated with substance abuse/mental health issues will improve NA  Medication Management: Evaluate patient's response, side effects, and tolerance of medication regimen.  Therapeutic Interventions: 1 to 1 sessions, Unit Group sessions and Medication administration.  Evaluation of Outcomes: Progressing  Physician Treatment Plan for Secondary Diagnosis: Principal Problem:   Bipolar affective disorder, current episode depressed with psychotic symptoms ([place]) Active Problems:   ESRD on dialysis [facility])   Palliative care encounter  Long Term Goal(s): Improvement in symptoms so as ready for discharge NA   Short Term Goals: Ability to identify changes in lifestyle to reduce recurrence of condition will improve Ability to verbalize feelings will improve Ability to disclose and discuss suicidal ideas Ability to demonstrate self-control will improve Ability to identify and develop effective coping behaviors will improve Ability to identify triggers associated with substance abuse/mental health issues will improve NA     Medication Management: Evaluate patient's response, side effects, and tolerance of medication  regimen.  Therapeutic Interventions: 1 to 1 sessions, Unit Group sessions and Medication administration.  Evaluation of Outcomes: Progressing   RN Treatment Plan for Primary Diagnosis: Bipolar affective disorder, current episode manic with psychotic symptoms ([place]) Long Term Goal(s): Knowledge of disease and therapeutic regimen to maintain health will improve  Short Term Goals: Ability to demonstrate self-control and Compliance with prescribed medications will improve  Medication Management: RN will administer medications as ordered by provider, will assess and evaluate patient's response and provide education to patient for prescribed medication. RN will report any adverse and/or side effects to prescribing provider.  Therapeutic Interventions: 1 on 1 counseling sessions, Psychoeducation, Medication administration, Evaluate responses to treatment, Monitor vital signs and CBGs as ordered, Perform/monitor CIWA, COWS, AIMS and Fall Risk screenings as ordered, Perform wound care treatments as ordered.  Evaluation of Outcomes: Progressing   LCSW Treatment Plan for Primary Diagnosis: Bipolar affective disorder, current episode manic with psychotic symptoms ([place]) Long Term Goal(s): Safe transition to appropriate next level of care at discharge, Engage patient in therapeutic group addressing interpersonal concerns.  Short Term Goals: Engage patient in aftercare planning with referrals and resources, Increase social support and Increase skills for wellness and recovery  Therapeutic Interventions: Assess for all discharge needs, 1 to 1 time with Social worker, Explore available resources and support systems, Assess for adequacy in community support network, Educate family and significant other(s) on suicide prevention, Complete Psychosocial Assessment, Interpersonal group therapy.  Evaluation of Outcomes: Not Progressing   Progress in Treatment: Attending groups: No. Participating in groups:  No. Taking medication as prescribed: Yes. Toleration medication: Yes. Family/Significant other contact made: No, will contact:  Patient refused family contact Patient understands diagnosis: Yes. Discussing patient identified problems/goals with staff: Yes. Medical problems stabilized or resolved: Yes. Denies suicidal/homicidal ideation: Yes. Issues/concerns per patient self-inventory: No. Other:   New problem(s) identified: Yes, Describe:  Ghazal has a fissula that needs surgery to repair.    New Short Term/Long Term Goal(s): "To be able to focus and trust a person and not doubt my doctor."  Discharge Plan or Barriers:  Discharge plan remains for Andrell to return home or to a assisted living facility and follow up with establish provider.   Reason for Continuation of Hospitalization: Medical Issues Medication stabilization Pt receiving  ECT treatments.  It is my understanding that treatments will pause for this week and resume next week when Dr. Weber Cooks returns.  Estimated Length of Stay: 5-7 days  Attendees: Patient:  10/16/2017 3:59 PM  Physician: Dr. Orson Slick, MD 10/16/2017 3:59 PM  Nursing: Eugenio Hoes, RN 10/16/2017 3:59 PM  RN Care Manager:  10/16/2017 3:59 PM  Social Worker: Derrek Gu, LCSW 10/16/2017 3:59 PM  Recreational Therapist:  10/16/2017 3:59 PM  Other: Alden Hipp, LCSW 10/16/2017 3:59 PM  Other:  10/16/2017 3:59 PM  Other: 10/16/2017 3:59 PM    Scribe for Treatment Team: Devona Konig, LCSW 10/16/2017 3:59 PM

## 2017-10-16 NOTE — Plan of Care (Signed)
Patient has not experienced any health related complications today. Patient's overall comfort level appears to have improved and is better than previous days on the unit. Patient denies SI/HI/AVH and pain at this time. Patient rates her depression level a "5/10", but can not elaborate as to why she'd depressed. Patient has verbalized understanding of her prescribed therapeutic regimen and has not voiced any further questions/concerns at this time. Patient has shown some positive coping skills today by taking her morning medications for this writer as well as she went to her special procedure to fix her AV fistula, even though she refused the procedure when this Probation officer first asked her, patient wanted to speak with her doctor before going. Patient has achieved adequate nutrition today, she has been eating more of her meals   throughout the day as well as drinking more fluids without being prompted. It was reported that patient did not sleep at all last night because she slept all day yesterday. Patient has remained free from injury and is safe on the unit at this time.   Problem: Pain Managment: Goal: General experience of comfort will improve Outcome: Progressing   Problem: Education: Goal: Will be free of psychotic symptoms Outcome: Progressing Goal: Knowledge of the prescribed therapeutic regimen will improve Outcome: Progressing   Problem: Coping: Goal: Ability to cope will improve Outcome: Progressing Goal: Ability to verbalize feelings will improve Outcome: Progressing   Problem: Health Behavior/Discharge Planning: Goal: Compliance with prescribed medication regimen will improve Outcome: Progressing   Problem: Nutritional: Goal: Ability to achieve adequate nutritional intake will improve Outcome: Progressing   Problem: Safety: Goal: Ability to remain free from injury will improve Outcome: Progressing   Problem: Self-Concept: Goal: Ability to verbalize positive feelings about self  will improve Outcome: Progressing   Problem: Activity: Goal: Ability to tolerate increased activity will improve Outcome: Progressing   Problem: Coping: Goal: Development of coping mechanisms to deal with changes in body function or appearance will improve Outcome: Progressing   Problem: Fluid Volume: Goal: Compliance with measures to maintain balanced fluid volume will improve Outcome: Progressing   Problem: Physical Regulation: Goal: Complications related to the disease process, condition or treatment will be avoided or minimized Outcome: Progressing   Problem: Activity: Goal: Sleeping patterns will improve Outcome: Progressing   Problem: Spiritual Needs Goal: Ability to function at adequate level Outcome: Progressing

## 2017-10-16 NOTE — Progress Notes (Signed)
Patient got out of her room and was taken to the dayroom. Currently with a visitor. Pleasant and cooperative. Staff continue to monitor on 1:1 for safety.

## 2017-10-16 NOTE — BHH Group Notes (Signed)
[place] Group Notes:  (Nursing/MHT/Case Management/Adjunct)  Date:  10/16/2017  Time:  12:35 AM  Type of Therapy:  Group Therapy  Participation Level:  Did Not Attend   Joyce [name] 10/16/2017, 12:35 AM

## 2017-10-16 NOTE — Progress Notes (Signed)
D- Patient alert and oriented. Patient presents in a pleasant mood on assessment stating to this writer that she is depressed rating it a "5/10", but does not know why she is depressed. It was reported to this writer that patient did not sleep at all last night, she tossed and turned because she slept all day yesterday. Patient denies SI, HI, AVH, and pain at this time. Patient also denies anxiety at this time. Patient stated to this writer that she was going to "lay here and then get up and eat something".  A- Scheduled medications administered to patient, per MD orders. Support and encouragement provided.  Routine safety checks conducted every 15 minutes. Patient informed to notify staff with problems or concerns.  R- No adverse drug reactions noted. Patient contracts for safety at this time. Patient compliant with medications and treatment plan. Patient receptive, calm, and cooperative. Patient interacts well with others on the unit.  Patient remains safe at this time.

## 2017-10-16 NOTE — Evaluation (Signed)
Physical Therapy Re-Evaluation Patient Details Name: Joyce [name] MRN: [id number] DOB: 04-08-1946 Today's Date: 10/16/2017   History of Present Illness  Pt is a 72 y.o. F that presented to hospital and admitted on 09/17/17 w/ psychotic, paranoia, and delusional symptoms. PMHX includes:  End stage renal disease, hemodialysis, HTN, murmur, bipolar, GERD    Clinical Impression  Pt Ind with bed mobility tasks with good speed and effort.  Pt able to perform sit to/from stand transfers from low bed height with good eccentric and concentric control.  Pt steady in static unsupported standing with feet apart and with feet together with both eyes open and closed.  Pt able to amb 200 feet without AD with good stability including during start/stops and sharp turns with SpO2 and HR WNL and with no adverse symptoms.  Pt admits to feeling tired frequently and to staying in bed "too much" during the day with pt education provided on the importance of activity and specifically walking during the day.  Pt would benefit from periodic ambulation with nursing with SBA during the day to prevent functional decline but is not appropriate for skilled PT services at this time.  Will complete PT orders but will reassess pt at a future date pending a decline in status upon receipt of new PT orders.        Follow Up Recommendations No PT follow up    Equipment Recommendations  None recommended by PT    Recommendations for Other Services       Precautions / Restrictions Precautions Precautions: Fall Precaution Comments: Pt has had two syncope episodes and orthostatic hypotension while hospitalized. LUE fistula port for HD. Restrictions Weight Bearing Restrictions: No      Mobility  Bed Mobility Overal bed mobility: Independent             General bed mobility comments: Good speed and effort with all bed mobility tasks  Transfers Overall transfer level: Independent Equipment used: None              General transfer comment: Good stability and control during sit to/from stand  Ambulation/Gait Ambulation/Gait assistance: Independent Ambulation Distance (Feet): 200 Feet Assistive device: None Gait Pattern/deviations: WFL(Within Functional Limits)   Gait velocity interpretation: at or above normal speed for age/gender General Gait Details: Good cadence and stability during amb without AD with reciprocal gait pattern and good bilateral step length  Stairs            Wheelchair Mobility    Modified Rankin (Stroke Patients Only)       Balance Overall balance assessment: Independent   Sitting balance-Leahy Scale: Normal     Standing balance support: No upper extremity supported Standing balance-Leahy Scale: Good Standing balance comment: Pt steady with feet apart and together with eyes closed without any sway noted             High level balance activites: Sudden stops;Direction changes;Turns High Level Balance Comments: Pt steady during amb without AD with start/stops and 180 deg turns             Pertinent Vitals/Pain Pain Assessment: No/denies pain    Home Living Family/patient expects to be discharged to:: Private residence Living Arrangements: Alone   Type of Home: Mobile home Home Access: Stairs to enter Entrance Stairs-Rails: Right;Left;Can reach both Entrance Stairs-Number of Steps: 2 Home Layout: One level Home Equipment: None      Prior Function Level of Independence: Independent  Comments: Ind amb without AD with no fall history, Ind with ADLs     Hand Dominance        Extremity/Trunk Assessment   Upper Extremity Assessment Upper Extremity Assessment: Overall WFL for tasks assessed    Lower Extremity Assessment Lower Extremity Assessment: Overall WFL for tasks assessed    Cervical / Trunk Assessment Cervical / Trunk Assessment: Normal  Communication   Communication: No difficulties  Cognition  Arousal/Alertness: Awake/alert Behavior During Therapy: WFL for tasks assessed/performed Overall Cognitive Status: Within Functional Limits for tasks assessed                                 General Comments: Pt pleasant and able to follow all commands with all questions answered appropriately      General Comments      Exercises Other Exercises Other Exercises: Pt education on physiological benefits of activity and principles of activity progression.   Assessment/Plan    PT Assessment Patent does not need any further PT services  PT Problem List         PT Treatment Interventions      PT Goals (Current goals can be found in the Care Plan section)  Acute Rehab PT Goals PT Goal Formulation: All assessment and education complete, DC therapy    Frequency     Barriers to discharge        Co-evaluation               AM-PAC PT "6 Clicks" Daily Activity  Outcome Measure Difficulty turning over in bed (including adjusting bedclothes, sheets and blankets)?: None Difficulty moving from lying on back to sitting on the side of the bed? : None Difficulty sitting down on and standing up from a chair with arms (e.g., wheelchair, bedside commode, etc,.)?: None Help needed moving to and from a bed to chair (including a wheelchair)?: None Help needed walking in hospital room?: None Help needed climbing 3-5 steps with a railing? : None 6 Click Score: 24    End of Session Equipment Utilized During Treatment: Gait belt Activity Tolerance: Patient tolerated treatment well Patient left: in bed;with nursing/sitter in room Nurse Communication: Mobility status PT Visit Diagnosis: Muscle weakness (generalized) (M62.81)    Time: 5573-2202 PT Time Calculation (min) (ACUTE ONLY): 20 min   Charges:   PT Evaluation $PT Eval Low Complexity: 1 Low $PT Re-evaluation: 1 Re-eval     PT G Codes:        DRoyetta Asal PT, DPT 10/16/17, 4:23 PM

## 2017-10-16 NOTE — Progress Notes (Signed)
Patient in room, alert and oriented. No sign of distress. Encouraged to express her feelings as needed. Safety maintained on 1:1.

## 2017-10-16 NOTE — Progress Notes (Signed)
14:30 Admitting and supervisor contacted regarding the transfer process of patient from behavior med. to special recovery. The patient was brought up from behavior med.  with a sitter in a wheelchair. Patient ate Lunch at 12:00 noon and vascular lab made aware.

## 2017-10-16 NOTE — H&P (Signed)
[facility] History & Physical Update  The patient was interviewed and re-examined.  The patient's previous History and Physical has been reviewed and is unchanged.  There is no change in the plan of care. We plan to proceed with the scheduled procedure.  Leotis Pain, MD  10/16/2017, 2:25 PM

## 2017-10-16 NOTE — BHH Group Notes (Signed)
LCSW Group Therapy Note  10/16/2017 1:00 pm  Type of Therapy and Topic:  Group Therapy:  Feelings around Relapse and Recovery  Participation Level:  Did Not Attend   Description of Group:    Patients in this group will discuss emotions they experience before and after a relapse. They will process how experiencing these feelings, or avoidance of experiencing them, relates to having a relapse. Facilitator will guide patients to explore emotions they have related to recovery. Patients will be encouraged to process which emotions are more powerful. They will be guided to discuss the emotional reaction significant others in their lives may have to their relapse or recovery. Patients will be assisted in exploring ways to respond to the emotions of others without this contributing to a relapse.  Therapeutic Goals: 1. Patient will identify two or more emotions that lead to a relapse for them 2. Patient will identify two emotions that result when they relapse 3. Patient will identify two emotions related to recovery 4. Patient will demonstrate ability to communicate their needs through discussion and/or role plays   Summary of Patient Progress: Alexys was invited to today's group, but chose not to attend.    Therapeutic Modalities:   Cognitive Behavioral Therapy Solution-Focused Therapy Assertiveness Training Relapse Prevention Therapy   Devona Konig, LCSW 10/16/2017 3:36 PM

## 2017-10-16 NOTE — Progress Notes (Signed)
Patient was fed lunch despite orders to be NPO.  Can not do procedure today without sedation.  Will try to do Monday if schedule allows

## 2017-10-16 NOTE — Progress Notes (Signed)
Called report to [facility] in [facility], procedure cancelled due to patient having eaten lunch. Pt returning to [facility] with sitter.

## 2017-10-16 NOTE — Progress Notes (Signed)
Patient in room with sitter. Medications were taken to her in room. Patient was somewhat agitated and guarded. Refused medications at first at first attempt but took them per encouragements. Will be NPO after midnight for an upcoming procedure. Staff continue to monitor for safety.

## 2017-10-16 NOTE — Progress Notes (Signed)
[facility] MD Progress Note  10/16/2017 3:41 PM Tianah Lonardo  MRN:  [id number]  Subjective:  Ms. Trower is very tired today as she woke up at 2 am and was restless for the rest of the night. The patient was apparently scheduled for fistula repair but the patient was given lunch. There was no clear indication in the chart that she was scheduled. The nurses checked and she was not on the schedule this morning. We need to communicate better. The patient initially refused procedure unless she is allowed to talk to Dr. Holley Raring. Dr. Holley Raring was kind enough to talk to [facility] on the phone so she signed consent.   Spoke with her daughter-in-law to update her and to see if they still support Franchon's decisions, which they do.   Treatment plan. We continue Zyprexa. I will discontinue all sleeping pills and give 2 mg ativan tonight.Hopefully fistula will be fixed and patient will resume dialysis. Back to ECT on Monday.  Social/disposition. TBE.  Principal Problem: Bipolar affective disorder, current episode manic with psychotic symptoms ([place]) Diagnosis:   Patient Active Problem List   Diagnosis Date Noted  . Bipolar affective disorder, current episode depressed with psychotic symptoms ([place]) [F31.2] 09/17/2017    Priority: High  . Palliative care encounter [Z51.5]   . Bipolar I disorder, most recent episode (or current) manic ([place]) [F31.10] 09/02/2017  . Acute delirium [R41.0] 09/02/2017  . Altered mental status [R41.82]   . Acute encephalopathy [G93.40] 09/01/2017  . ESRD on dialysis ([place]) [N18.6, Z99.2] 04/06/2017  . Complication of vascular access for dialysis [T82.9XXA] 04/06/2017  . Hematuria [R31.9] 12/18/2016  . Polycystic kidney [Q61.3] 10/12/2016  . Ruptured cyst of kidney [Q61.00] 10/03/2016  . HTN (hypertension) [I10] 10/01/2016  . Chronic kidney disease [N18.9] 10/01/2016  . Anemia associated with chronic renal failure [D63.1] 09/24/2016  . Acute kidney insufficiency [N28.9] 07/28/2016  .  Noninfectious diarrhea [K52.9]   . Benign neoplasm of cecum [D12.0]   . Benign neoplasm of ascending colon [D12.2]   . Diarrhea [R19.7]   . Nausea [R11.0]   . Gastric polyp [K31.7]   . Chronic constipation [K59.09] 12/27/2014  . Nausea with vomiting [R11.2] 12/27/2014  . Left sided abdominal pain [R10.9] 12/27/2014  . Personal history of colonic polyps [Z86.010] 05/10/2013   Total Time spent with patient: 30 minutes  Past Psychiatric History: bipolar disorder  Past Medical History:  Past Medical History:  Diagnosis Date  . Anal fissure   . Bipolar affective disorder ([place])   . CKD (chronic kidney disease)    Dr Holley Raring [name] 4  . Colon polyps   . Diverticulitis   . Diverticulitis   . Family history of adverse reaction to anesthesia    mom - PONV  . GERD (gastroesophageal reflux disease)   . Headache    migraines - none over 10 yrs  . Heart murmur   . History of hiatal hernia   . Hypertension   . Pancreatitis    Valproic acid  . Vertigo     Past Surgical History:  Procedure Laterality Date  . A/V FISTULAGRAM Left 04/14/2017   Procedure: A/V Fistulagram;  Surgeon: Katha Cabal, MD;  Location: [place] [facility];  Service: Cardiovascular;  Laterality: Left;  . A/V FISTULAGRAM Left 06/09/2017   Procedure: A/V FISTULAGRAM;  Surgeon: Katha Cabal, MD;  Location: [place] [facility];  Service: Cardiovascular;  Laterality: Left;  . ABDOMINAL HYSTERECTOMY  1990 ?  . AV FISTULA PLACEMENT  4/30  .  BREAST EXCISIONAL BIOPSY Left 1994   neg surgical bx  . CHOLECYSTECTOMY  2003  . COLONOSCOPY  2014   Dr. Jamal Collin  . COLONOSCOPY WITH PROPOFOL N/A 09/24/2015   Procedure: COLONOSCOPY WITH random colon byopies.;  Surgeon: Lucilla Lame, MD;  Location: [place];  Service: Endoscopy;  Laterality: N/A;  . ESOPHAGOGASTRODUODENOSCOPY (EGD) WITH PROPOFOL N/A 09/24/2015   Procedure: ESOPHAGOGASTRODUODENOSCOPY (EGD) ;  Surgeon: Lucilla Lame, MD;  Location: [place];  Service: Endoscopy;  Laterality: N/A;  . EYE SURGERY    . PERIPHERAL VASCULAR CATHETERIZATION N/A 05/29/2015   Procedure: A/V Shuntogram/Fistulagram;  Surgeon: Katha Cabal, MD;  Location: [place] [facility];  Service: Cardiovascular;  Laterality: N/A;  . PERIPHERAL VASCULAR CATHETERIZATION N/A 05/29/2015   Procedure: A/V Shunt Intervention;  Surgeon: Katha Cabal, MD;  Location: [place] [facility];  Service: Cardiovascular;  Laterality: N/A;  . POLYPECTOMY  09/24/2015   Procedure: POLYPECTOMY INTESTINAL;  Surgeon: Lucilla Lame, MD;  Location: [place];  Service: Endoscopy;;  cecal polyp ascending polyp   Family History:  Family History  Problem Relation Age of Onset  . Stroke Father   . Hypertension Father   . Breast cancer Other   . Colon cancer Neg Hx   . Liver disease Neg Hx    Family Psychiatric  History: none Social History:  Social History   Substance and Sexual Activity  Alcohol Use No  . Alcohol/week: 0.0 oz     Social History   Substance and Sexual Activity  Drug Use No    Social History   Socioeconomic History  . Marital status: Single    Spouse name: Not on file  . Number of children: 1  . Years of education: Not on file  . Highest education level: Not on file  Occupational History  . Occupation: Surveyor, quantity: [employer]  Social Needs  . Financial resource strain: Not on file  . Food insecurity:    Worry: Not on file    Inability: Not on file  . Transportation needs:    Medical: Not on file    Non-medical: Not on file  Tobacco Use  . Smoking status: Never Smoker  . Smokeless tobacco: Never Used  Substance and Sexual Activity  . Alcohol use: No    Alcohol/week: 0.0 oz  . Drug use: No  . Sexual activity: Not Currently  Lifestyle  . Physical activity:    Days per week: Not on file    Minutes per session: Not on file  . Stress: Not on file  Relationships  . Social connections:    Talks on phone: Not on file     Gets together: Not on file    Attends religious service: Not on file    Active member of club or organization: Not on file    Attends meetings of clubs or organizations: Not on file    Relationship status: Not on file  Other Topics Concern  . 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   Additional Social History:    History of alcohol / drug use?: No history of alcohol / drug abuse                    Sleep: Poor  Appetite:  Fair  Current Medications: Current Facility-Administered Medications  Medication Dose Route Frequency Provider Last Rate Last Dose  .  0.9 %  sodium chloride infusion   Intravenous Continuous Algernon Huxley, MD      . acetaminophen (TYLENOL) tablet 325 mg  325 mg Oral BID PRN Clapacs, [name], MD   325 mg at 10/16/17 0411  . alum & mag hydroxide-simeth (MAALOX/MYLANTA) 200-200-20 MG/5ML suspension 30 mL  30 mL Oral Q4H PRN Clapacs, [name], MD   30 mL at 10/16/17 0547  . bisacodyl (DULCOLAX) EC tablet 5 mg  5 mg Oral Daily PRN Clapacs, [name], MD      . brimonidine (ALPHAGAN) 0.2 % ophthalmic solution 1 drop  1 drop Both Eyes BID Clapacs, [name], MD   1 drop at 10/16/17 0927  . ceFAZolin (ANCEF) IVPB 2g/100 mL premix  2 g Intravenous 30 min Pre-Op Algernon Huxley, MD      . Derrill Memo ON 10/17/2017] cyanocobalamin ((VITAMIN B-12)) injection 1,000 mcg  1,000 mcg Intramuscular Q30 days Clapacs, [name], MD      . dicyclomine (BENTYL) tablet 20 mg  20 mg Oral TID AC Clapacs, [name], MD   20 mg at 10/16/17 1227  . epoetin alfa (EPOGEN,PROCRIT) injection 4,000 Units  4,000 Units Intravenous Q M,W,F-HD [name], MD   4,000 Units at 10/15/17 1400  . feeding supplement (NEPRO CARB STEADY) liquid 237 mL  237 mL Oral BID BM Singh, [name], MD   237 mL at 10/16/17 1100  . fentaNYL (SUBLIMAZE) injection 25 mcg  25 mcg Intravenous Q5 min PRN [name], MD      . haloperidol (HALDOL)  tablet 5 mg  5 mg Oral Q8H PRN Ludmila Ebarb B, MD   5 mg at 10/16/17 0242   Or  . haloperidol lactate (HALDOL) injection 5 mg  5 mg Intramuscular Q8H PRN Mihika Surrette B, MD   5 mg at 10/01/17 0908  . lidocaine-prilocaine (EMLA) cream 1 application  1 application Topical Q M,W,F Clapacs, [name], MD   1 application at 60/73/71 1219  . LORazepam (ATIVAN) injection 2 mg  2 mg Intramuscular Q4H PRN Clapacs, [name], MD   2 mg at 10/14/17 0134  . LORazepam (ATIVAN) injection 2 mg  2 mg Intramuscular Q6H PRN Clapacs, [name], MD   2 mg at 10/15/17 1152  . magnesium hydroxide (MILK OF MAGNESIA) suspension 30 mL  30 mL Oral Daily PRN Clapacs, [name], MD      . metoprolol tartrate (LOPRESSOR) tablet 12.5 mg  12.5 mg Oral BID [name], MD   12.5 mg at 10/16/17 0925  . midodrine (PROAMATINE) tablet 10 mg  10 mg Oral TID WC [name], MD   10 mg at 10/16/17 1227  . multivitamin (RENA-VIT) tablet 1 tablet  1 tablet Oral QHS Ila Landowski B, MD   1 tablet at 10/15/17 2130  . OLANZapine zydis (ZYPREXA) disintegrating tablet 15 mg  15 mg Oral QHS Elsia Lasota B, MD   15 mg at 10/15/17 2130  . ondansetron (ZOFRAN) injection 4 mg  4 mg Intravenous Once PRN [name], MD      . pantoprazole (PROTONIX) EC tablet 40 mg  40 mg Oral Daily Clapacs, [name], MD   40 mg at 10/16/17 0925  . pentafluoroprop-tetrafluoroeth (GEBAUERS) aerosol   Topical PRN [name], MD      . rOPINIRole (REQUIP) tablet 2 mg  2 mg Oral QHS Sanjiv Castorena B, MD   2 mg at 10/15/17 2130  . temazepam (RESTORIL) capsule 15 mg  15 mg Oral  QHS Abe Schools B, MD   15 mg at 10/15/17 2130  . timolol (TIMOPTIC) 0.5 % ophthalmic solution 1 drop  1 drop Both Eyes BID Clapacs, [name], MD   1 drop at 10/16/17 0927  . vitamin C (ASCORBIC ACID) tablet 500 mg  500 mg Oral BID Canon Gola B, MD   500 mg at 10/16/17 4235    Lab Results:  Results for orders placed or performed during the hospital  encounter of 09/17/17 (from the past 48 hour(s))  Phosphorus     Status: None   Collection Time: 10/15/17 11:55 AM  Result Value Ref Range   Phosphorus 3.6 2.5 - 4.6 mg/dL    Comment: Performed at [facility], [address] [place], [place] [address]    Blood Alcohol level:  Lab Results  Component Value Date   [facility] <10 31/54/0086    Metabolic Disorder Labs: Lab Results  Component Value Date   HGBA1C 4.8 09/18/2017   MPG 91.06 09/18/2017   No results found for: PROLACTIN Lab Results  Component Value Date   CHOL 251 (H) 09/18/2017   TRIG 124 09/18/2017   HDL 66 09/18/2017   CHOLHDL 3.8 09/18/2017   VLDL 25 09/18/2017   LDLCALC 160 (H) 09/18/2017   LDLCALC 43 05/14/2014    Physical Findings: AIMS: Facial and Oral Movements Muscles of Facial Expression: None, normal Lips and Perioral Area: None, normal Jaw: None, normal Tongue: None, normal,Extremity Movements Upper (arms, wrists, hands, fingers): None, normal Lower (legs, knees, ankles, toes): None, normal, Trunk Movements Neck, shoulders, hips: None, normal, Overall Severity Severity of abnormal movements (highest score from questions above): None, normal Incapacitation due to abnormal movements: None, normal Patient's awareness of abnormal movements (rate only patient's report): No Awareness, Dental Status Current problems with teeth and/or dentures?: No Does patient usually wear dentures?: No  CIWA:    COWS:     Musculoskeletal: Strength & Muscle Tone: within normal limits Gait & Station: normal Patient leans: N/A  Psychiatric Specialty Exam: Physical Exam  Nursing note and vitals reviewed. Psychiatric: Her mood appears anxious. Her speech is delayed. She is slowed and withdrawn. Thought content is paranoid. Cognition and memory are impaired. She expresses impulsivity.    Review of Systems  Neurological: Negative.   Psychiatric/Behavioral: Positive for depression. The patient has insomnia.    All other systems reviewed and are negative.   Blood pressure (!) 129/49, pulse 67, temperature 98 F (36.7 C), temperature source Oral, resp. rate 13, height 5' 3.5" (1.613 m), weight 68.5 kg (151 lb), SpO2 97 %.Body mass index is 26.33 kg/m.  General Appearance: Fairly Groomed  Eye Contact:  Good  Speech:  Clear and Coherent  Volume:  Decreased  Mood:  Euthymic  Affect:  Blunt  Thought Process:  Goal Directed and Descriptions of Associations: Intact  Orientation:  Full (Time, Place, and Person)  Thought Content:  Paranoid Ideation  Suicidal Thoughts:  No  Homicidal Thoughts:  No  Memory:  Immediate;   Fair Recent;   Fair Remote;   Fair  Judgement:  Fair  Insight:  Fair  Psychomotor Activity:  Decreased  Concentration:  Concentration: Fair and Attention Span: Fair  Recall:  [employer] of Knowledge:  Fair  Language:  Fair  Akathisia:  No  Handed:  Right  AIMS (if indicated):     Assets:  Communication Skills Desire for Improvement Financial Resources/Insurance Housing Resilience Social Support  ADL's:  Intact  Cognition:  WNL  Sleep:  Number of Hours: 4.25     Treatment Plan Summary: Daily contact with patient to assess and evaluate symptoms and progress in treatment and Medication management   Ms.Bahja Bence Bowesis a 72 y.o.white femalewith bipolar diosrderandend stage renal disease on hemodialysisadmitted for mixed episode,that turned into catatonic depression,who responded favorably to initial ECT treatment.  #bipolar disorder -continue Zyprexa 15 mg nightly -resume ECT on 4/1 if necessary -start Ativan 2 mg nightly  #Restless legs -Requip 2 mg nightly  #Chest pain -cardiology consult is greatly appreciated  -no acute event -continue Tylenol for chest wall pain  #gerd -continue pantprazole40 mg daily -continue ondansetronPRN  #ESRD -continue dialysis pernephrology -needs improved access, VS input is  appreciated  #Deconditioning -PT consult, patient refused  #Disposition -sister has HCPOA -discharge to home with family? -follow up with Dr. Bryna Colander, MD 10/16/2017, 3:41 PM

## 2017-10-17 LAB — CBC
HEMATOCRIT: 29 % — AB (ref 35.0–47.0)
Hemoglobin: 9.8 g/dL — ABNORMAL LOW (ref 12.0–16.0)
MCH: 32.6 pg (ref 26.0–34.0)
MCHC: 33.9 g/dL (ref 32.0–36.0)
MCV: 96.2 fL (ref 80.0–100.0)
PLATELETS: 229 10*3/uL (ref 150–440)
RBC: 3.02 MIL/uL — ABNORMAL LOW (ref 3.80–5.20)
RDW: 14.5 % (ref 11.5–14.5)
WBC: 6.9 10*3/uL (ref 3.6–11.0)

## 2017-10-17 LAB — RENAL FUNCTION PANEL
ALBUMIN: 3.3 g/dL — AB (ref 3.5–5.0)
Anion gap: 13 (ref 5–15)
BUN: 45 mg/dL — AB (ref 6–20)
CO2: 27 mmol/L (ref 22–32)
Calcium: 9.6 mg/dL (ref 8.9–10.3)
Chloride: 98 mmol/L — ABNORMAL LOW (ref 101–111)
Creatinine, Ser: 5.31 mg/dL — ABNORMAL HIGH (ref 0.44–1.00)
GFR calc Af Amer: 9 mL/min — ABNORMAL LOW (ref >60)
GFR, EST NON AFRICAN AMERICAN: 7 mL/min — AB (ref >60)
Glucose, Bld: 131 mg/dL — ABNORMAL HIGH (ref 65–99)
PHOSPHORUS: 2.9 mg/dL (ref 2.5–4.6)
POTASSIUM: 3.9 mmol/L (ref 3.5–5.1)
Sodium: 138 mmol/L (ref 135–145)

## 2017-10-17 LAB — GLUCOSE, CAPILLARY: Glucose-Capillary: 112 mg/dL — ABNORMAL HIGH (ref 65–99)

## 2017-10-17 NOTE — Progress Notes (Signed)
Remains on 1:1 with sitter at the bedside. No behavior issues to report at this time.

## 2017-10-17 NOTE — Progress Notes (Signed)
Pre dialysis assessment 

## 2017-10-17 NOTE — Progress Notes (Signed)
Patient in room with sitter. Calm and cooperative per encouragements. No sign of distress.

## 2017-10-17 NOTE — Progress Notes (Signed)
Patient resting in bed with eyes closed.Safety sitter at bedside.

## 2017-10-17 NOTE — Progress Notes (Signed)
[facility]  ROUNDING NOTE   Subjective:  Patient seen and evaluated during hemodialysis. Appears to be tolerating quite well. Patient unfortunately ate yesterday therefore fistulogram could not be performed as she was not n.p.o.   Objective:  Vital signs in last 24 hours:  Temp:  [97.8 F (36.6 C)-98.3 F (36.8 C)] 97.8 F (36.6 C) (03/30 1100) Pulse Rate:  [67-89] 79 (03/30 1245) Resp:  [13-24] 18 (03/30 1245) BP: (84-129)/(49-73) 109/60 (03/30 1245) SpO2:  [96 %-100 %] 96 % (03/30 1100)  Weight change:  Filed Weights    Intake/Output: I/O last 3 completed shifts: In: 240 [P.O.:240] Out: -    Intake/Output this shift:  No intake/output data recorded.  Physical Exam: General: NAD, laying in bed  Head: Normocephalic, atraumatic. Hearing intact  Eyes: Anicteric  Neck: Supple, trachea midline  Lungs:  Clear to auscultation  Heart: Regular rate and rhythm  Abdomen:  Soft, nontender  Extremities: no peripheral edema.  Psych:  Interactive today  Neurologic: Nonfocal, moving all four extremities  Skin: No lesions  Access: Left AVF    Basic Metabolic Panel: Recent Labs  Lab 10/10/17 1620 10/11/17 1529 10/13/17 2206 10/15/17 1155 10/17/17 1009  NA 140 137  --   --  138  K 3.8 4.1  --   --  3.9  CL 101 95*  --   --  98*  CO2 29 32  --   --  27  GLUCOSE 93 107*  --   --  131*  BUN 25* 26*  --   --  45*  CREATININE 4.18* 4.82*  --   --  5.31*  CALCIUM 8.6* 9.1  --   --  9.6  PHOS  --   --  3.5 3.6 2.9    Liver Function Tests: Recent Labs  Lab 10/11/17 1529 10/17/17 1009  AST 23  --   ALT 16  --   ALKPHOS 84  --   BILITOT 0.6  --   PROT 6.7  --   ALBUMIN 3.5 3.3*   No results for input(s): LIPASE, AMYLASE in the last 168 hours. No results for input(s): AMMONIA in the last 168 hours.  CBC: Recent Labs  Lab 10/17/17 1009  WBC 6.9  HGB 9.8*  HCT 29.0*  MCV 96.2  PLT 229    Cardiac Enzymes: Recent Labs  Lab 10/11/17 1529   CKMB 1.4  TROPONINI 0.03*    BNP: Invalid input(s): POCBNP  CBG: Recent Labs  Lab 10/17/17 0641  GLUCAP 112*    Microbiology: Results for orders placed or performed during the hospital encounter of 09/01/17  Urine culture     Status: Abnormal   Collection Time: 09/01/17  4:32 PM  Result Value Ref Range Status   Specimen Description   Final    URINE, RANDOM Performed at [facility], [address] [place], [place] [address]    Special Requests   Final    NONE Performed at [facility], [address] [place], [place] [address]    Culture MULTIPLE SPECIES PRESENT, SUGGEST RECOLLECTION (A)  Final   Report Status 09/03/2017 FINAL  Final  Culture, blood (routine x 2)     Status: None   Collection Time: 09/01/17  6:05 PM  Result Value Ref Range Status   Specimen Description BLOOD RIGHT HAND  Final   Special Requests   Final    BOTTLES DRAWN AEROBIC AND ANAEROBIC Blood Culture results may not be optimal due to  an excessive volume of blood received in culture bottles   Culture   Final    NO GROWTH 5 DAYS Performed at [facility], [address] [place], [place] [address]    Report Status 09/06/2017 FINAL  Final  Culture, blood (routine x 2)     Status: None   Collection Time: 09/01/17  6:34 PM  Result Value Ref Range Status   Specimen Description BLOOD RAC  Final   Special Requests   Final    BOTTLES DRAWN AEROBIC AND ANAEROBIC Blood Culture adequate volume   Culture   Final    NO GROWTH 5 DAYS Performed at [facility], [address] [place], [place] [address]    Report Status 09/06/2017 FINAL  Final    Coagulation Studies: No results for input(s): LABPROT, INR in the last 72 hours.  Urinalysis: No results for input(s): COLORURINE, LABSPEC, PHURINE, GLUCOSEU, HGBUR, BILIRUBINUR, KETONESUR, PROTEINUR, UROBILINOGEN, NITRITE, LEUKOCYTESUR in the last 72 hours.  Invalid input(s): APPERANCEUR    Imaging: No results  found.   Medications:   . sodium chloride    .  ceFAZolin (ANCEF) IV     . brimonidine  1 drop Both Eyes BID  . cyanocobalamin  1,000 mcg Intramuscular Q30 days  . dicyclomine  20 mg Oral TID AC  . epoetin (EPOGEN/PROCRIT) injection  4,000 Units Intravenous Q M,W,F-HD  . feeding supplement (NEPRO CARB STEADY)  237 mL Oral BID BM  . lidocaine-prilocaine  1 application Topical Q M,W,F  . LORazepam  2 mg Oral QHS  . metoprolol tartrate  12.5 mg Oral BID  . midodrine  10 mg Oral TID WC  . multivitamin  1 tablet Oral QHS  . OLANZapine zydis  15 mg Oral QHS  . pantoprazole  40 mg Oral Daily  . rOPINIRole  2 mg Oral QHS  . timolol  1 drop Both Eyes BID  . vitamin C  500 mg Oral BID   acetaminophen, alum & mag hydroxide-simeth, bisacodyl, fentaNYL (SUBLIMAZE) injection, haloperidol **OR** haloperidol lactate, LORazepam, LORazepam, magnesium hydroxide, ondansetron (ZOFRAN) IV, pentafluoroprop-tetrafluoroeth  Assessment/ Plan:  Joyce [name] is a 72 y.o. white female with end stage renal disease on hemodialysis, diverticulosis, hypotension,  bipolar disorder, polycystic kidney disease   [facility]/MWF  1. ESRD on HD Patient seen and evaluated during hemodialysis today.  Appears to be tolerating well.  We plan to complete dialysis today.  She has been performing dialysis on a Tuesday, Thursday, Saturday schedule while here to help facilitate ECT treatments.  In addition patient will need fistulogram on Monday.  2. Anemia of CKD:  Continue Epogen with dialysis.  3. Secondary hyperparathyroidism:  Phosphorus acceptable at 2.9.  Continue to monitor.  4. Hypotension:  Maintain the patient on midodrine to help maintain blood pressure.  5. Delirium, bipolar affective disorder, major depression: Patient was receiving ECT treatments last week.  These are likely to restart next week.     LOS: [place] 3/30/201912:49 PM

## 2017-10-17 NOTE — Progress Notes (Signed)
Up in the day room for dinner.Safety maintained with sitter.

## 2017-10-17 NOTE — Plan of Care (Signed)
Patient is calm and cooperative.No agitated behaviors noted.PO intake is continues to be poor.C/o chest pain on & off.Otherwise no issues verbalized.Compliant with medications.

## 2017-10-17 NOTE — [facility] Group Notes (Signed)

## 2017-10-17 NOTE — Progress Notes (Signed)
Patient ambulated to bath room.Safety sitter at bedside.

## 2017-10-17 NOTE — Progress Notes (Signed)
Patient resting in bed.Safety maintained with sitter.

## 2017-10-17 NOTE — [facility] Group Notes (Signed)
[place] Group Notes:  (Nursing/MHT/Case Management/Adjunct)  Date:  10/17/2017  Time:  10:56 PM  Type of Therapy:  Group Therapy  Participation Level:  Did Not Attend  Summary of Progress/Problems:  Joyce [name] 10/17/2017, 10:56 PM

## 2017-10-17 NOTE — Plan of Care (Signed)
Remains on 1:1. Oriented x 2.

## 2017-10-17 NOTE — Progress Notes (Signed)
Patient is taken to dialysis.Safety sitter with patient.

## 2017-10-17 NOTE — Progress Notes (Addendum)
[facility] MD Progress Note  10/17/2017 6:52 PM Joyce [name]  MRN:  [id number]  Subjective:   Joyce [name] has no complaints but did not sleep last night again. No more restless legs. Mood is better, she is physically stronger, took dialysis without problems, awaiting fistula repair on Monday. She had a good meeting with her daughter in law and plans to spend time with family following discharge. Asking to go home.   Met with daughter in law who is supportive and ready to help at home.  Treatment plan. Continue Zyprexa. ECT Monday. Continue dialysis.  Social/disposition. Will be discharged with family. Follow up with Dr. Thurmond Butts.  Principal Problem: Bipolar affective disorder, current episode manic with psychotic symptoms ([place]) Diagnosis:   Patient Active Problem List   Diagnosis Date Noted  . Bipolar affective disorder, current episode depressed with psychotic symptoms ([place]) [F31.2] 09/17/2017    Priority: High  . Palliative care encounter [Z51.5]   . Bipolar I disorder, most recent episode (or current) manic ([place]) [F31.10] 09/02/2017  . Acute delirium [R41.0] 09/02/2017  . Altered mental status [R41.82]   . Acute encephalopathy [G93.40] 09/01/2017  . ESRD on dialysis ([place]) [N18.6, Z99.2] 04/06/2017  . Complication of vascular access for dialysis [T82.9XXA] 04/06/2017  . Hematuria [R31.9] 12/18/2016  . Polycystic kidney [Q61.3] 10/12/2016  . Ruptured cyst of kidney [Q61.00] 10/03/2016  . HTN (hypertension) [I10] 10/01/2016  . Chronic kidney disease [N18.9] 10/01/2016  . Anemia associated with chronic renal failure [D63.1] 09/24/2016  . Acute kidney insufficiency [N28.9] 07/28/2016  . Noninfectious diarrhea [K52.9]   . Benign neoplasm of cecum [D12.0]   . Benign neoplasm of ascending colon [D12.2]   . Diarrhea [R19.7]   . Nausea [R11.0]   . Gastric polyp [K31.7]   . Chronic constipation [K59.09] 12/27/2014  . Nausea with vomiting [R11.2] 12/27/2014  . Left sided abdominal pain  [R10.9] 12/27/2014  . Personal history of colonic polyps [Z86.010] 05/10/2013   Total Time spent with patient: 30 minutes  Past Psychiatric History: bipolar disorder  Past Medical History:  Past Medical History:  Diagnosis Date  . Anal fissure   . Bipolar affective disorder ([place])   . CKD (chronic kidney disease)    Dr Holley Raring [name] 4  . Colon polyps   . Diverticulitis   . Diverticulitis   . Family history of adverse reaction to anesthesia    mom - PONV  . GERD (gastroesophageal reflux disease)   . Headache    migraines - none over 10 yrs  . Heart murmur   . History of hiatal hernia   . Hypertension   . Pancreatitis    Valproic acid  . Vertigo     Past Surgical History:  Procedure Laterality Date  . A/V FISTULAGRAM Left 04/14/2017   Procedure: A/V Fistulagram;  Surgeon: Katha Cabal, MD;  Location: [place] [facility];  Service: Cardiovascular;  Laterality: Left;  . A/V FISTULAGRAM Left 06/09/2017   Procedure: A/V FISTULAGRAM;  Surgeon: Katha Cabal, MD;  Location: [place] [facility];  Service: Cardiovascular;  Laterality: Left;  . ABDOMINAL HYSTERECTOMY  1990 ?  . AV FISTULA PLACEMENT  4/30  . BREAST EXCISIONAL BIOPSY Left 1994   neg surgical bx  . CHOLECYSTECTOMY  2003  . COLONOSCOPY  2014   Dr. Jamal Collin  . COLONOSCOPY WITH PROPOFOL N/A 09/24/2015   Procedure: COLONOSCOPY WITH random colon byopies.;  Surgeon: Lucilla Lame, MD;  Location: [place];  Service: Endoscopy;  Laterality: N/A;  .  ESOPHAGOGASTRODUODENOSCOPY (EGD) WITH PROPOFOL N/A 09/24/2015   Procedure: ESOPHAGOGASTRODUODENOSCOPY (EGD) ;  Surgeon: Lucilla Lame, MD;  Location: [place];  Service: Endoscopy;  Laterality: N/A;  . EYE SURGERY    . PERIPHERAL VASCULAR CATHETERIZATION N/A 05/29/2015   Procedure: A/V Shuntogram/Fistulagram;  Surgeon: Katha Cabal, MD;  Location: [place] [facility];  Service: Cardiovascular;  Laterality: N/A;  . PERIPHERAL VASCULAR CATHETERIZATION  N/A 05/29/2015   Procedure: A/V Shunt Intervention;  Surgeon: Katha Cabal, MD;  Location: [place] [facility];  Service: Cardiovascular;  Laterality: N/A;  . POLYPECTOMY  09/24/2015   Procedure: POLYPECTOMY INTESTINAL;  Surgeon: Lucilla Lame, MD;  Location: [place];  Service: Endoscopy;;  cecal polyp ascending polyp   Family History:  Family History  Problem Relation Age of Onset  . Stroke Father   . Hypertension Father   . Breast cancer Other   . Colon cancer Neg Hx   . Liver disease Neg Hx    Family Psychiatric  History: none Social History:  Social History   Substance and Sexual Activity  Alcohol Use No  . Alcohol/week: 0.0 oz     Social History   Substance and Sexual Activity  Drug Use No    Social History   Socioeconomic History  . Marital status: Single    Spouse name: Not on file  . Number of children: 1  . Years of education: Not on file  . Highest education level: Not on file  Occupational History  . Occupation: Surveyor, quantity: [employer]  Social Needs  . Financial resource strain: Not on file  . Food insecurity:    Worry: Not on file    Inability: Not on file  . Transportation needs:    Medical: Not on file    Non-medical: Not on file  Tobacco Use  . Smoking status: Never Smoker  . Smokeless tobacco: Never Used  Substance and Sexual Activity  . Alcohol use: No    Alcohol/week: 0.0 oz  . Drug use: No  . Sexual activity: Not Currently  Lifestyle  . Physical activity:    Days per week: Not on file    Minutes per session: Not on file  . Stress: Not on file  Relationships  . Social connections:    Talks on phone: Not on file    Gets together: Not on file    Attends religious service: Not on file    Active member of club or organization: Not on file    Attends meetings of clubs or organizations: Not on file    Relationship status: Not on file  Other Topics Concern  . 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   Additional Social History:    History of alcohol / drug use?: No history of alcohol / drug abuse                    Sleep: Poor  Appetite:  Fair  Current Medications: Current Facility-Administered Medications  Medication Dose Route Frequency Provider Last Rate Last Dose  . 0.9 %  sodium chloride infusion   Intravenous Continuous Algernon Huxley, MD      . acetaminophen (TYLENOL) tablet 325 mg  325 mg Oral BID PRN Clapacs, [name], MD   325 mg at 10/17/17 1722  . alum & mag hydroxide-simeth (MAALOX/MYLANTA) 200-200-20 MG/5ML suspension 30 mL  30 mL Oral Q4H PRN Clapacs, [name], MD   30 mL at 10/16/17 1614  . bisacodyl (DULCOLAX) EC tablet 5 mg  5 mg Oral Daily PRN Clapacs, [name], MD      . brimonidine (ALPHAGAN) 0.2 % ophthalmic solution 1 drop  1 drop Both Eyes BID Clapacs, [name], MD   1 drop at 10/17/17 1722  . ceFAZolin (ANCEF) IVPB 2g/100 mL premix  2 g Intravenous 30 min Pre-Op Algernon Huxley, MD      . cyanocobalamin ((VITAMIN B-12)) injection 1,000 mcg  1,000 mcg Intramuscular Q30 days Clapacs, [name], MD   1,000 mcg at 10/17/17 1723  . dicyclomine (BENTYL) tablet 20 mg  20 mg Oral TID AC Clapacs, [name], MD   20 mg at 10/17/17 1724  . epoetin alfa (EPOGEN,PROCRIT) injection 4,000 Units  4,000 Units Intravenous Q M,W,F-HD [name], MD   4,000 Units at 10/15/17 1400  . feeding supplement (NEPRO CARB STEADY) liquid 237 mL  237 mL Oral BID BM Candiss Norse, [name], MD   237 mL at 10/17/17 1023  . fentaNYL (SUBLIMAZE) injection 25 mcg  25 mcg Intravenous Q5 min PRN [name], MD      . haloperidol (HALDOL) tablet 5 mg  5 mg Oral Q8H PRN Carver Murakami B, MD   5 mg at 10/16/17 0242   Or  . haloperidol lactate (HALDOL) injection 5 mg  5 mg Intramuscular Q8H PRN Bryten Maher B, MD   5 mg at 10/01/17 0908  . lidocaine-prilocaine (EMLA) cream 1 application  1 application Topical Q M,W,F Clapacs,  [name], MD   1 application at 56/38/93 1219  . LORazepam (ATIVAN) injection 2 mg  2 mg Intramuscular Q4H PRN Clapacs, [name], MD   2 mg at 10/14/17 0134  . LORazepam (ATIVAN) injection 2 mg  2 mg Intramuscular Q6H PRN Clapacs, [name], MD   2 mg at 10/15/17 1152  . LORazepam (ATIVAN) tablet 2 mg  2 mg Oral QHS Shatasha Lambing B, MD   2 mg at 10/16/17 2157  . magnesium hydroxide (MILK OF MAGNESIA) suspension 30 mL  30 mL Oral Daily PRN Clapacs, [name], MD      . metoprolol tartrate (LOPRESSOR) tablet 12.5 mg  12.5 mg Oral BID [name], MD   12.5 mg at 10/17/17 1744  . midodrine (PROAMATINE) tablet 10 mg  10 mg Oral TID WC [name], MD   10 mg at 10/17/17 1723  . multivitamin (RENA-VIT) tablet 1 tablet  1 tablet Oral QHS Shirleymae Hauth B, MD   1 tablet at 10/16/17 2157  . OLANZapine zydis (ZYPREXA) disintegrating tablet 15 mg  15 mg Oral QHS Ahsha Hinsley B, MD   15 mg at 10/16/17 2157  . ondansetron (ZOFRAN) injection 4 mg  4 mg Intravenous Once PRN [name], MD      . pantoprazole (PROTONIX) EC tablet 40 mg  40 mg Oral Daily Clapacs, [name], MD   40 mg at 10/16/17 0925  . pentafluoroprop-tetrafluoroeth (GEBAUERS) aerosol   Topical PRN [name], MD      . rOPINIRole (REQUIP) tablet 2 mg  2 mg Oral QHS Nishtha Raider B, MD   2 mg at 10/16/17 2157  . timolol (TIMOPTIC) 0.5 % ophthalmic solution 1 drop  1 drop Both Eyes BID Clapacs, [name], MD   1 drop at 10/17/17 1722  . vitamin C (ASCORBIC ACID) tablet 500 mg  500 mg Oral BID Gates Jividen  B, MD   500 mg at 10/17/17 1724    Lab Results:  Results for orders placed or performed during the hospital encounter of 09/17/17 (from the past 48 hour(s))  Glucose, capillary     Status: Abnormal   Collection Time: 10/17/17  6:41 AM  Result Value Ref Range   Glucose-Capillary 112 (H) 65 - 99 mg/dL  Renal function panel     Status: Abnormal   Collection Time: 10/17/17 10:09 AM  Result Value Ref Range    Sodium 138 135 - 145 mmol/L   Potassium 3.9 3.5 - 5.1 mmol/L   Chloride 98 (L) 101 - 111 mmol/L   CO2 27 22 - 32 mmol/L   Glucose, Bld 131 (H) 65 - 99 mg/dL   BUN 45 (H) 6 - 20 mg/dL   Creatinine, Ser 5.31 (H) 0.44 - 1.00 mg/dL   Calcium 9.6 8.9 - 10.3 mg/dL   Phosphorus 2.9 2.5 - 4.6 mg/dL   Albumin 3.3 (L) 3.5 - 5.0 g/dL   GFR calc non Af Amer 7 (L) >60 mL/min   GFR calc Af Amer 9 (L) >60 mL/min    Comment: (NOTE) The eGFR has been calculated using the CKD EPI equation. This calculation has not been validated in all clinical situations. eGFR's persistently <60 mL/min signify possible Chronic Kidney Disease.    Anion gap 13 5 - 15    Comment: Performed at [facility], [address] [place], [place] [address]  CBC     Status: Abnormal   Collection Time: 10/17/17 10:09 AM  Result Value Ref Range   WBC 6.9 3.6 - 11.0 K/uL   RBC 3.02 (L) 3.80 - 5.20 MIL/uL   Hemoglobin 9.8 (L) 12.0 - 16.0 g/dL   HCT 29.0 (L) 35.0 - 47.0 %   MCV 96.2 80.0 - 100.0 fL   MCH 32.6 26.0 - 34.0 pg   MCHC 33.9 32.0 - 36.0 g/dL   RDW 14.5 11.5 - 14.5 %   Platelets 229 150 - 440 K/uL    Comment: Performed at [facility], [address] [place], [place] [address]    Blood Alcohol level:  Lab Results  Component Value Date   [facility] <10 65/99/3570    Metabolic Disorder Labs: Lab Results  Component Value Date   HGBA1C 4.8 09/18/2017   MPG 91.06 09/18/2017   No results found for: PROLACTIN Lab Results  Component Value Date   CHOL 251 (H) 09/18/2017   TRIG 124 09/18/2017   HDL 66 09/18/2017   CHOLHDL 3.8 09/18/2017   VLDL 25 09/18/2017   LDLCALC 160 (H) 09/18/2017   LDLCALC 43 05/14/2014    Physical Findings: AIMS: Facial and Oral Movements Muscles of Facial Expression: None, normal Lips and Perioral Area: None, normal Jaw: None, normal Tongue: None, normal,Extremity Movements Upper (arms, wrists, hands, fingers): None, normal Lower (legs, knees, ankles, toes):  None, normal, Trunk Movements Neck, shoulders, hips: None, normal, Overall Severity Severity of abnormal movements (highest score from questions above): None, normal Incapacitation due to abnormal movements: None, normal Patient's awareness of abnormal movements (rate only patient's report): No Awareness, Dental Status Current problems with teeth and/or dentures?: No Does patient usually wear dentures?: No  CIWA:    COWS:     Musculoskeletal: Strength & Muscle Tone: within normal limits Gait & Station: normal Patient leans: N/A  Psychiatric Specialty Exam: Physical Exam  Nursing note and vitals reviewed. Psychiatric: She has a normal mood and affect. Her speech is normal  and behavior is normal. Judgment and thought content normal. Cognition and memory are normal.    Review of Systems  Neurological: Negative.   Psychiatric/Behavioral: Negative.   All other systems reviewed and are negative.   Blood pressure (!) 116/45, pulse 79, temperature 97.8 F (36.6 C), temperature source Oral, resp. rate 15, height 5' 3.5" (1.613 m), weight 68.5 kg (151 lb), SpO2 96 %.Body mass index is 26.33 kg/m.  General Appearance: Casual  Eye Contact:  Good  Speech:  Clear and Coherent  Volume:  Normal  Mood:  Anxious  Affect:  Flat  Thought Process:  Goal Directed and Descriptions of Associations: Intact  Orientation:  Full (Time, Place, and Person)  Thought Content:  WDL  Suicidal Thoughts:  No  Homicidal Thoughts:  No  Memory:  Immediate;   Fair Recent;   Fair Remote;   Fair  Judgement:  Fair  Insight:  Fair  Psychomotor Activity:  Normal  Concentration:  Concentration: Fair and Attention Span: Fair  Recall:  [employer] of Knowledge:  Fair  Language:  Fair  Akathisia:  No  Handed:  Right  AIMS (if indicated):     Assets:  Communication Skills Desire for Improvement Financial Resources/Insurance Housing Resilience Social Support  ADL's:  Intact  Cognition:  WNL  Sleep:  Number  of Hours: 4.25     Treatment Plan Summary: Daily contact with patient to assess and evaluate symptoms and progress in treatment and Medication management   JoyceKhadeja Abt Bowesis a 72 y.o.white femalewith bipolar diosrderandend stage renal disease on hemodialysisadmitted for mixed episode,that turned into catatonic depression,who responded favorably to initial ECT treatment.  #bipolar disorder -continue Zyprexa 15 mg nightly -resume ECT on 4/1 if necessary -start Ativan 2 mg nightly  #Restless legs -Requip 2 mg nightly  #Chest pain -cardiology consult is greatly appreciated  -no acute event -continue Tylenol for chest wall pain  #gerd -continue pantprazole40 mg daily -continue ondansetronPRN  #ESRD -continue dialysis pernephrology -needs improved access, VS input is appreciated  #Deconditioning -PT assessment is appreciated  #Disposition -sister has HCPOA -discharge to home with family -follow up with Dr. Thurmond Butts  I certify that the services received since the previous certification/recertification were and continue to be medically necessary as the treatment provided can be reasonably expected to improve the patient's condition; the medical record documents that the services furnished were intensive treatment services or their equivalent services, and this patient continues to need, on a daily basis, active treatment furnished directly by or requiring the supervision of inpatient psychiatric personnel.      Orson Slick, MD 10/17/2017, 6:52 PM

## 2017-10-17 NOTE — Progress Notes (Signed)
According to the dialysis nurse the procedure is scheduled to Monday.NPO discontinued.Breakfast given to patient.Safety sitter at bedside.

## 2017-10-17 NOTE — Progress Notes (Signed)
Patient resting in bed.sitter at bedside.

## 2017-10-17 NOTE — Progress Notes (Signed)
Hd started  

## 2017-10-17 NOTE — Progress Notes (Signed)
Hd completed 

## 2017-10-17 NOTE — Progress Notes (Signed)
Patient is back from dialysis.Lunch provided.Patient states "I need to rest I am tired."Sitter at bedside.

## 2017-10-17 NOTE — Progress Notes (Signed)
Patient appropriate behavior with 1:1.

## 2017-10-17 NOTE — Progress Notes (Signed)
Patient resting in bed.Sitter at beside.

## 2017-10-17 NOTE — Progress Notes (Signed)
Patient has been in bed sleeping. Remains on 1:1 as recommended for safety.

## 2017-10-18 LAB — GLUCOSE, CAPILLARY: GLUCOSE-CAPILLARY: 89 mg/dL (ref 65–99)

## 2017-10-18 MED ORDER — LIDOCAINE 5 % EX PTCH
1.0000 | MEDICATED_PATCH | CUTANEOUS | Status: DC
  Administered 2017-10-18 – 2017-10-21 (×4): 1 via TRANSDERMAL
  Filled 2017-10-18 (×5): qty 1

## 2017-10-18 NOTE — Progress Notes (Signed)
D- Patient alert and oriented. Patient presents in a pleasant mood on assessment stating that she slept good last night and stated to this writer "I'm feeling better, just so tired". Patient denies SI, HI, AVH, and pain at the time of assessment. Patient also denies any signs/symptoms of depression/anxiety at this time. Patient's goal for today is to "get a little stronger". Patient has been getting out of bed for meals/snacks as well as medication in order to accomplish this goal. Patient has been observed out in the milieu without any issues.concerns at this time.  A- Scheduled medications administered to patient, per MD orders. Support and encouragement provided.  Routine safety checks conducted every 15 minutes.  Patient informed to notify staff with problems or concerns.  R- No adverse drug reactions noted. Patient contracts for safety at this time. Patient compliant with medications and treatment plan. Patient receptive, calm, and cooperative. Patient interacts well with others on the unit. Patient remains safe at this time.

## 2017-10-18 NOTE — Progress Notes (Addendum)
[facility] MD Progress Note  10/18/2017 4:50 AM Joyce [name]  MRN:  [id number]  Subjective:   Joyce [name] has been improving daily. She is off 1:1 and without problems. Her gait is steady, she is stronger. She has been able to ask for help when necessary. She is much more optimistic about there future, reconciled with her children and has been fully participating in her treatment.  Treatment plan. We continue Zyprexa zydis 15 mg nightly for depression, Requip for restless legs and Ativan 2 mg nightly for sleep. This has been a problem lately. She will continue Dialysis. Dr. Weber Cooks will be back tomorrow and the patient could resume ECT. She needs fistula repair before going home and this will be done on Monday.  Social/disposition. She will be discharge with her son and will continue to recuoerate in [place]. She will follow up with Dr. Thurmond Butts.  Principal Problem: Bipolar affective disorder, current episode manic with psychotic symptoms ([place]) Diagnosis:   Patient Active Problem List   Diagnosis Date Noted  . Bipolar affective disorder, current episode depressed with psychotic symptoms ([place]) [F31.2] 09/17/2017    Priority: High  . Palliative care encounter [Z51.5]   . Bipolar I disorder, most recent episode (or current) manic ([place]) [F31.10] 09/02/2017  . Acute delirium [R41.0] 09/02/2017  . Altered mental status [R41.82]   . Acute encephalopathy [G93.40] 09/01/2017  . ESRD on dialysis ([place]) [N18.6, Z99.2] 04/06/2017  . Complication of vascular access for dialysis [T82.9XXA] 04/06/2017  . Hematuria [R31.9] 12/18/2016  . Polycystic kidney [Q61.3] 10/12/2016  . Ruptured cyst of kidney [Q61.00] 10/03/2016  . HTN (hypertension) [I10] 10/01/2016  . Chronic kidney disease [N18.9] 10/01/2016  . Anemia associated with chronic renal failure [D63.1] 09/24/2016  . Acute kidney insufficiency [N28.9] 07/28/2016  . Noninfectious diarrhea [K52.9]   . Benign neoplasm of cecum [D12.0]   . Benign neoplasm  of ascending colon [D12.2]   . Diarrhea [R19.7]   . Nausea [R11.0]   . Gastric polyp [K31.7]   . Chronic constipation [K59.09] 12/27/2014  . Nausea with vomiting [R11.2] 12/27/2014  . Left sided abdominal pain [R10.9] 12/27/2014  . Personal history of colonic polyps [Z86.010] 05/10/2013   Total Time spent with patient: 20 minutes  Past Psychiatric History: bipolar disorder  Past Medical History:  Past Medical History:  Diagnosis Date  . Anal fissure   . Bipolar affective disorder ([place])   . CKD (chronic kidney disease)    Dr Holley Raring [name] 4  . Colon polyps   . Diverticulitis   . Diverticulitis   . Family history of adverse reaction to anesthesia    mom - PONV  . GERD (gastroesophageal reflux disease)   . Headache    migraines - none over 10 yrs  . Heart murmur   . History of hiatal hernia   . Hypertension   . Pancreatitis    Valproic acid  . Vertigo     Past Surgical History:  Procedure Laterality Date  . A/V FISTULAGRAM Left 04/14/2017   Procedure: A/V Fistulagram;  Surgeon: Katha Cabal, MD;  Location: [place] [facility];  Service: Cardiovascular;  Laterality: Left;  . A/V FISTULAGRAM Left 06/09/2017   Procedure: A/V FISTULAGRAM;  Surgeon: Katha Cabal, MD;  Location: [place] [facility];  Service: Cardiovascular;  Laterality: Left;  . ABDOMINAL HYSTERECTOMY  1990 ?  . AV FISTULA PLACEMENT  4/30  . BREAST EXCISIONAL BIOPSY Left 1994   neg surgical bx  . CHOLECYSTECTOMY  2003  .  COLONOSCOPY  2014   Dr. Jamal Collin  . COLONOSCOPY WITH PROPOFOL N/A 09/24/2015   Procedure: COLONOSCOPY WITH random colon byopies.;  Surgeon: Lucilla Lame, MD;  Location: [place];  Service: Endoscopy;  Laterality: N/A;  . ESOPHAGOGASTRODUODENOSCOPY (EGD) WITH PROPOFOL N/A 09/24/2015   Procedure: ESOPHAGOGASTRODUODENOSCOPY (EGD) ;  Surgeon: Lucilla Lame, MD;  Location: [place];  Service: Endoscopy;  Laterality: N/A;  . EYE SURGERY    . PERIPHERAL VASCULAR  CATHETERIZATION N/A 05/29/2015   Procedure: A/V Shuntogram/Fistulagram;  Surgeon: Katha Cabal, MD;  Location: [place] [facility];  Service: Cardiovascular;  Laterality: N/A;  . PERIPHERAL VASCULAR CATHETERIZATION N/A 05/29/2015   Procedure: A/V Shunt Intervention;  Surgeon: Katha Cabal, MD;  Location: [place] [facility];  Service: Cardiovascular;  Laterality: N/A;  . POLYPECTOMY  09/24/2015   Procedure: POLYPECTOMY INTESTINAL;  Surgeon: Lucilla Lame, MD;  Location: [place];  Service: Endoscopy;;  cecal polyp ascending polyp   Family History:  Family History  Problem Relation Age of Onset  . Stroke Father   . Hypertension Father   . Breast cancer Other   . Colon cancer Neg Hx   . Liver disease Neg Hx    Family Psychiatric  History: see H&P Social History:  Social History   Substance and Sexual Activity  Alcohol Use No  . Alcohol/week: 0.0 oz     Social History   Substance and Sexual Activity  Drug Use No    Social History   Socioeconomic History  . Marital status: Single    Spouse name: Not on file  . Number of children: 1  . Years of education: Not on file  . Highest education level: Not on file  Occupational History  . Occupation: Surveyor, quantity: [employer]  Social Needs  . Financial resource strain: Not on file  . Food insecurity:    Worry: Not on file    Inability: Not on file  . Transportation needs:    Medical: Not on file    Non-medical: Not on file  Tobacco Use  . Smoking status: Never Smoker  . Smokeless tobacco: Never Used  Substance and Sexual Activity  . Alcohol use: No    Alcohol/week: 0.0 oz  . Drug use: No  . Sexual activity: Not Currently  Lifestyle  . Physical activity:    Days per week: Not on file    Minutes per session: Not on file  . Stress: Not on file  Relationships  . Social connections:    Talks on phone: Not on file    Gets together: Not on file    Attends religious service: Not on file    Active  member of club or organization: Not on file    Attends meetings of clubs or organizations: Not on file    Relationship status: Not on file  Other Topics Concern  . 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   Additional Social History:    History of alcohol / drug use?: No history of alcohol / drug abuse                    Sleep: Poor  Appetite:  Fair  Current Medications: Current Facility-Administered Medications  Medication Dose Route Frequency Provider Last Rate Last Dose  . 0.9 %  sodium chloride infusion   Intravenous Continuous Dew, Erskine Squibb, MD      .  acetaminophen (TYLENOL) tablet 325 mg  325 mg Oral BID PRN Clapacs, [name], MD   325 mg at 10/17/17 1722  . alum & mag hydroxide-simeth (MAALOX/MYLANTA) 200-200-20 MG/5ML suspension 30 mL  30 mL Oral Q4H PRN Clapacs, [name], MD   30 mL at 10/17/17 2117  . bisacodyl (DULCOLAX) EC tablet 5 mg  5 mg Oral Daily PRN Clapacs, [name], MD      . brimonidine (ALPHAGAN) 0.2 % ophthalmic solution 1 drop  1 drop Both Eyes BID Clapacs, [name], MD   1 drop at 10/17/17 1722  . ceFAZolin (ANCEF) IVPB 2g/100 mL premix  2 g Intravenous 30 min Pre-Op Algernon Huxley, MD      . cyanocobalamin ((VITAMIN B-12)) injection 1,000 mcg  1,000 mcg Intramuscular Q30 days Clapacs, [name], MD   1,000 mcg at 10/17/17 1723  . dicyclomine (BENTYL) tablet 20 mg  20 mg Oral TID AC Clapacs, [name], MD   20 mg at 10/17/17 1724  . epoetin alfa (EPOGEN,PROCRIT) injection 4,000 Units  4,000 Units Intravenous Q M,W,F-HD [name], MD   4,000 Units at 10/15/17 1400  . feeding supplement (NEPRO CARB STEADY) liquid 237 mL  237 mL Oral BID BM Candiss Norse, [name], MD   237 mL at 10/17/17 1023  . fentaNYL (SUBLIMAZE) injection 25 mcg  25 mcg Intravenous Q5 min PRN [name], MD      . haloperidol (HALDOL) tablet 5 mg  5 mg Oral Q8H PRN Jock Mahon B, MD   5 mg at 10/16/17 0242    Or  . haloperidol lactate (HALDOL) injection 5 mg  5 mg Intramuscular Q8H PRN Panagiota Perfetti B, MD   5 mg at 10/01/17 0908  . lidocaine-prilocaine (EMLA) cream 1 application  1 application Topical Q M,W,F Clapacs, [name], MD   1 application at 42/70/62 1219  . LORazepam (ATIVAN) injection 2 mg  2 mg Intramuscular Q4H PRN Clapacs, [name], MD   2 mg at 10/14/17 0134  . LORazepam (ATIVAN) injection 2 mg  2 mg Intramuscular Q6H PRN Clapacs, [name], MD   2 mg at 10/15/17 1152  . LORazepam (ATIVAN) tablet 2 mg  2 mg Oral QHS Latrese Carolan B, MD   2 mg at 10/17/17 2112  . magnesium hydroxide (MILK OF MAGNESIA) suspension 30 mL  30 mL Oral Daily PRN Clapacs, [name], MD      . metoprolol tartrate (LOPRESSOR) tablet 12.5 mg  12.5 mg Oral BID [name], MD   12.5 mg at 10/17/17 1744  . midodrine (PROAMATINE) tablet 10 mg  10 mg Oral TID WC [name], MD   10 mg at 10/17/17 1723  . multivitamin (RENA-VIT) tablet 1 tablet  1 tablet Oral QHS Camylle Whicker B, MD   1 tablet at 10/17/17 2112  . OLANZapine zydis (ZYPREXA) disintegrating tablet 15 mg  15 mg Oral QHS Jadah Bobak B, MD   15 mg at 10/17/17 2112  . ondansetron (ZOFRAN) injection 4 mg  4 mg Intravenous Once PRN [name], MD      . pantoprazole (PROTONIX) EC tablet 40 mg  40 mg Oral Daily Clapacs, [name], MD   40 mg at 10/16/17 0925  . pentafluoroprop-tetrafluoroeth (GEBAUERS) aerosol   Topical PRN [name], MD      . rOPINIRole (REQUIP) tablet 2 mg  2 mg Oral QHS Rayshawn Visconti B, MD   2 mg at 10/17/17 2112  . timolol (TIMOPTIC) 0.5 % ophthalmic solution  1 drop  1 drop Both Eyes BID Clapacs, [name], MD   1 drop at 10/17/17 1722  . vitamin C (ASCORBIC ACID) tablet 500 mg  500 mg Oral BID Hassaan Crite B, MD   500 mg at 10/17/17 1724    Lab Results:  Results for orders placed or performed during the hospital encounter of 09/17/17 (from the past 48 hour(s))  Glucose, capillary     Status:  Abnormal   Collection Time: 10/17/17  6:41 AM  Result Value Ref Range   Glucose-Capillary 112 (H) 65 - 99 mg/dL  Renal function panel     Status: Abnormal   Collection Time: 10/17/17 10:09 AM  Result Value Ref Range   Sodium 138 135 - 145 mmol/L   Potassium 3.9 3.5 - 5.1 mmol/L   Chloride 98 (L) 101 - 111 mmol/L   CO2 27 22 - 32 mmol/L   Glucose, Bld 131 (H) 65 - 99 mg/dL   BUN 45 (H) 6 - 20 mg/dL   Creatinine, Ser 5.31 (H) 0.44 - 1.00 mg/dL   Calcium 9.6 8.9 - 10.3 mg/dL   Phosphorus 2.9 2.5 - 4.6 mg/dL   Albumin 3.3 (L) 3.5 - 5.0 g/dL   GFR calc non Af Amer 7 (L) >60 mL/min   GFR calc Af Amer 9 (L) >60 mL/min    Comment: (NOTE) The eGFR has been calculated using the CKD EPI equation. This calculation has not been validated in all clinical situations. eGFR's persistently <60 mL/min signify possible Chronic Kidney Disease.    Anion gap 13 5 - 15    Comment: Performed at [facility], [address] [place], [place] [address]  CBC     Status: Abnormal   Collection Time: 10/17/17 10:09 AM  Result Value Ref Range   WBC 6.9 3.6 - 11.0 K/uL   RBC 3.02 (L) 3.80 - 5.20 MIL/uL   Hemoglobin 9.8 (L) 12.0 - 16.0 g/dL   HCT 29.0 (L) 35.0 - 47.0 %   MCV 96.2 80.0 - 100.0 fL   MCH 32.6 26.0 - 34.0 pg   MCHC 33.9 32.0 - 36.0 g/dL   RDW 14.5 11.5 - 14.5 %   Platelets 229 150 - 440 K/uL    Comment: Performed at [facility], [address] [place], [place] [address]    Blood Alcohol level:  Lab Results  Component Value Date   [facility] <10 09/98/3382    Metabolic Disorder Labs: Lab Results  Component Value Date   HGBA1C 4.8 09/18/2017   MPG 91.06 09/18/2017   No results found for: PROLACTIN Lab Results  Component Value Date   CHOL 251 (H) 09/18/2017   TRIG 124 09/18/2017   HDL 66 09/18/2017   CHOLHDL 3.8 09/18/2017   VLDL 25 09/18/2017   LDLCALC 160 (H) 09/18/2017   LDLCALC 43 05/14/2014    Physical Findings: AIMS: Facial and Oral Movements Muscles  of Facial Expression: None, normal Lips and Perioral Area: None, normal Jaw: None, normal Tongue: None, normal,Extremity Movements Upper (arms, wrists, hands, fingers): None, normal Lower (legs, knees, ankles, toes): None, normal, Trunk Movements Neck, shoulders, hips: None, normal, Overall Severity Severity of abnormal movements (highest score from questions above): None, normal Incapacitation due to abnormal movements: None, normal Patient's awareness of abnormal movements (rate only patient's report): No Awareness, Dental Status Current problems with teeth and/or dentures?: No Does patient usually wear dentures?: No  CIWA:    COWS:     Musculoskeletal: Strength & Muscle  Tone: within normal limits Gait & Station: normal Patient leans: N/A  Psychiatric Specialty Exam: Physical Exam  Nursing note and vitals reviewed. Psychiatric: Her speech is normal and behavior is normal. Judgment and thought content normal. Her mood appears anxious. Cognition and memory are normal.    Review of Systems  Cardiovascular: Positive for chest pain.  Neurological: Negative.   Psychiatric/Behavioral: The patient has insomnia.   All other systems reviewed and are negative.   Blood pressure (!) 116/45, pulse 79, temperature 97.8 F (36.6 C), temperature source Oral, resp. rate 15, height 5' 3.5" (1.613 m), weight 68.5 kg (151 lb), SpO2 96 %.Body mass index is 26.33 kg/m.  General Appearance: Casual  Eye Contact:  Good  Speech:  Clear and Coherent  Volume:  Normal  Mood:  Euthymic  Affect:  Appropriate  Thought Process:  Goal Directed and Descriptions of Associations: Intact  Orientation:  Full (Time, Place, and Person)  Thought Content:  WDL  Suicidal Thoughts:  No  Homicidal Thoughts:  No  Memory:  Immediate;   Fair Recent;   Fair Remote;   Fair  Judgement:  Fair  Insight:  Fair  Psychomotor Activity:  Decreased  Concentration:  Concentration: Fair and Attention Span: Fair  Recall:   [employer] of Knowledge:  Fair  Language:  Fair  Akathisia:  No  Handed:  Right  AIMS (if indicated):     Assets:  Communication Skills Desire for Improvement Financial Resources/Insurance Housing Resilience Social Support  ADL's:  Intact  Cognition:  WNL  Sleep:  Number of Hours: 4.25     Treatment Plan Summary: Daily contact with patient to assess and evaluate symptoms and progress in treatment and Medication management   Joyce Tafolla Bowesis a 72 y.o.white femalewith bipolar diosrderandend stage renal disease on hemodialysisadmitted for mixed episode,that turned into catatonic depression,who responded favorably to initial ECT treatment.  #bipolar disorder -continue Zyprexa 15 mg nightly -resume ECT on 4/1 if necessary  #Insomnia, slept 4 hours -continue Ativan 2 mg nightly  #Restless legs -Requip2 mgnightly  #Chest pain -cardiology consult is greatly appreciated  -no acute event -continue Tylenol for chest wall pain -add lidocaine gel  #Gerd -continue pantprazole40 mg daily -continue ondansetronPRN  #ESRD -continue dialysis pernephrology -needs improved access, VS input is appreciated  #Deconditioning -PT assessment is appreciated  #Disposition -sister has HCPOA -discharge to home with family -follow up with Dr. Bryna Colander, MD 10/18/2017, 4:50 AM

## 2017-10-18 NOTE — Plan of Care (Signed)
Patient has not experienced any health related complications today. Patient's overall comfort level appears to have improved and is better than previous days on the unit. Patient denies SI/HI/AVH as well as pain and any signs/symptoms of depression/anxiety at this time. Patient has verbalized understanding of her prescribed therapeutic regimen and has not voiced any further questions/concerns at this time. Patient has shown positive coping skills today by taking her prescribed medications for this writer as well as being out in the milieu during meal/snack times without experiencing any issues at this time. Patient has achieved adequate nutrition today, she has been eating most of her meals today as well as drinking more fluids without being prompted. Patient stated that she slept good last night and that she's "feeling better, just so tired". Patient has remained free from injury and is safe on the unit at this time.   Problem: Pain Managment: Goal: General experience of comfort will improve Outcome: Progressing   Problem: Education: Goal: Will be free of psychotic symptoms Outcome: Progressing Goal: Knowledge of the prescribed therapeutic regimen will improve Outcome: Progressing   Problem: Coping: Goal: Ability to cope will improve Outcome: Progressing Goal: Ability to verbalize feelings will improve Outcome: Progressing   Problem: Health Behavior/Discharge Planning: Goal: Compliance with prescribed medication regimen will improve Outcome: Progressing   Problem: Nutritional: Goal: Ability to achieve adequate nutritional intake will improve Outcome: Progressing   Problem: Safety: Goal: Ability to remain free from injury will improve Outcome: Progressing   Problem: Self-Concept: Goal: Ability to verbalize positive feelings about self will improve Outcome: Progressing   Problem: Activity: Goal: Ability to tolerate increased activity will improve Outcome: Progressing   Problem:  Coping: Goal: Development of coping mechanisms to deal with changes in body function or appearance will improve Outcome: Progressing   Problem: Fluid Volume: Goal: Compliance with measures to maintain balanced fluid volume will improve Outcome: Progressing   Problem: Physical Regulation: Goal: Complications related to the disease process, condition or treatment will be avoided or minimized Outcome: Progressing   Problem: Activity: Goal: Sleeping patterns will improve Outcome: Progressing   Problem: Spiritual Needs Goal: Ability to function at adequate level Outcome: Progressing

## 2017-10-18 NOTE — Progress Notes (Signed)
Patient spent most of the evening resting in bed quietly. She did get up for snack and then returned back to her room she later complained of GERD given Maalox and offered ginger ale with some effect. She was complaint with medications on shift. No behavior issues to report on shift at this time. She appears to be in bed resting quietly at this time.

## 2017-10-18 NOTE — [facility] Group Notes (Signed)
LCSW Group Therapy Note 10/18/2017 1:15pm  Type of Therapy and Topic: Group Therapy: Feelings Around Returning Home & Establishing a Supportive Framework and Supporting Oneself When Supports Not Available  Participation Level: Did Not Attend  Description of Group:  Patients first processed thoughts and feelings about upcoming discharge. These included fears of upcoming changes, lack of change, new living environments, judgements and expectations from others and overall stigma of mental health issues. The group then discussed the definition of a supportive framework, what that looks and feels like, and how do to discern it from an unhealthy non-supportive network. The group identified different types of supports as well as what to do when your family/friends are less than helpful or unavailable  Therapeutic Goals  1. Patient will identify one healthy supportive network that they can use at discharge. 2. Patient will identify one factor of a supportive framework and how to tell it from an unhealthy network. 3. Patient able to identify one coping skill to use when they do not have positive supports from others. 4. Patient will demonstrate ability to communicate their needs through discussion and/or role plays.  Summary of Patient Progress:    Therapeutic Modalities Cognitive Behavioral Therapy Motivational Interviewing   Joyce [name]  CUEBAS-COLON, LCSW 10/18/2017 11:07 AM 

## 2017-10-19 ENCOUNTER — Other Ambulatory Visit: Payer: Self-pay | Admitting: Psychiatry

## 2017-10-19 ENCOUNTER — Inpatient Hospital Stay: Payer: Medicare HMO | Admitting: Anesthesiology

## 2017-10-19 ENCOUNTER — Encounter: Payer: Self-pay | Admitting: *Deleted

## 2017-10-19 DIAGNOSIS — N186 End stage renal disease: Secondary | ICD-10-CM | POA: Diagnosis not present

## 2017-10-19 DIAGNOSIS — I120 Hypertensive chronic kidney disease with stage 5 chronic kidney disease or end stage renal disease: Secondary | ICD-10-CM | POA: Diagnosis not present

## 2017-10-19 DIAGNOSIS — K219 Gastro-esophageal reflux disease without esophagitis: Secondary | ICD-10-CM | POA: Diagnosis not present

## 2017-10-19 DIAGNOSIS — F315 Bipolar disorder, current episode depressed, severe, with psychotic features: Secondary | ICD-10-CM | POA: Diagnosis not present

## 2017-10-19 LAB — GLUCOSE, CAPILLARY
GLUCOSE-CAPILLARY: 93 mg/dL (ref 65–99)
GLUCOSE-CAPILLARY: 109 mg/dL — AB (ref 65–99)
Glucose-Capillary: 109 mg/dL — ABNORMAL HIGH (ref 65–99)
Glucose-Capillary: 124 mg/dL — ABNORMAL HIGH (ref 65–99)

## 2017-10-19 MED ORDER — METHOHEXITAL SODIUM 100 MG/10ML IV SOSY
PREFILLED_SYRINGE | INTRAVENOUS | Status: DC | PRN
  Administered 2017-10-19: 70 mg via INTRAVENOUS

## 2017-10-19 MED ORDER — SUCCINYLCHOLINE CHLORIDE 20 MG/ML IJ SOLN
INTRAMUSCULAR | Status: AC
  Filled 2017-10-19: qty 1

## 2017-10-19 MED ORDER — SUCCINYLCHOLINE CHLORIDE 200 MG/10ML IV SOSY
PREFILLED_SYRINGE | INTRAVENOUS | Status: DC | PRN
  Administered 2017-10-19: 80 mg via INTRAVENOUS

## 2017-10-19 MED ORDER — SODIUM CHLORIDE 0.9 % IV SOLN
500.0000 mL | Freq: Once | INTRAVENOUS | Status: AC
  Administered 2017-10-19: 500 mL via INTRAVENOUS

## 2017-10-19 NOTE — Progress Notes (Signed)
Patient ID: Joyce [name], female   DOB: April 22, 1946, 72 y.o.   MRN: [id number]  We were unable to get in contact with Dr. Lucky Cowboy. The patient has been NPO but is apparently NOT scheduled for fistula repair today.   Spoke with Dr. Delana Meyer, vascular surgery on call, who put [name] on his schedule for Wednesday PM after ECT treatment.

## 2017-10-19 NOTE — Anesthesia Postprocedure Evaluation (Signed)
Anesthesia Post Note  Patient: Joyce [name]  Procedure(s) Performed: ECT TX  Patient location during evaluation: PACU Anesthesia Type: General Level of consciousness: awake and alert Pain management: pain level controlled Vital Signs Assessment: post-procedure vital signs reviewed and stable Respiratory status: spontaneous breathing, nonlabored ventilation and respiratory function stable Cardiovascular status: blood pressure returned to baseline and stable Postop Assessment: no signs of nausea or vomiting Anesthetic complications: no     Last Vitals:  Vitals:   10/19/17 1120 10/19/17 1300  BP: 133/72 (!) 120/56  Pulse: 91 68  Resp: 17 17  Temp:  36.5 C  SpO2: 98% 99%    Last Pain:  Vitals:   10/19/17 1100  TempSrc:   PainSc: 0-No pain                 Rama Sorci

## 2017-10-19 NOTE — Progress Notes (Signed)
[facility] MD Progress Note  10/19/2017 8:22 PM Joyce [name]  MRN:  [id number] Subjective: Follow-up for this 72 year old woman with bipolar disorder.  Patient is significantly improved compared to when I saw her last.  She is able to hold a conversation and show a much better range of affect.  Patient denies suicidal thoughts.  Does not make psychotic statements.  Has been cooperative and insightful about her dialysis.  Tolerating medicine well.  ECT seems to be well tolerated and continues to show improvement.  Patient describes herself this evening as being "disappointed" that her surgery on her dialysis graft was not performed today.  Looking forward to it probably being done on Wednesday. Principal Problem: Bipolar affective disorder, current episode manic with psychotic symptoms ([place]) Diagnosis:   Patient Active Problem List   Diagnosis Date Noted  . Palliative care encounter [Z51.5]   . Bipolar affective disorder, current episode depressed with psychotic symptoms ([place]) [F31.2] 09/17/2017  . Bipolar I disorder, most recent episode (or current) manic ([place]) [F31.10] 09/02/2017  . Acute delirium [R41.0] 09/02/2017  . Altered mental status [R41.82]   . Acute encephalopathy [G93.40] 09/01/2017  . ESRD on dialysis ([place]) [N18.6, Z99.2] 04/06/2017  . Complication of vascular access for dialysis [T82.9XXA] 04/06/2017  . Hematuria [R31.9] 12/18/2016  . Polycystic kidney [Q61.3] 10/12/2016  . Ruptured cyst of kidney [Q61.00] 10/03/2016  . HTN (hypertension) [I10] 10/01/2016  . Chronic kidney disease [N18.9] 10/01/2016  . Anemia associated with chronic renal failure [N18.9, D63.1] 09/24/2016  . Acute kidney insufficiency [N28.9] 07/28/2016  . Noninfectious diarrhea [K52.9]   . Benign neoplasm of cecum [D12.0]   . Benign neoplasm of ascending colon [D12.2]   . Diarrhea [R19.7]   . Nausea [R11.0]   . Gastric polyp [K31.7]   . Chronic constipation [K59.09] 12/27/2014  . Nausea with vomiting [R11.2]  12/27/2014  . Left sided abdominal pain [R10.9] 12/27/2014  . Personal history of colonic polyps [Z86.010] 05/10/2013   Total Time spent with patient: 30 minutes  Past Psychiatric History: Past history of long-standing bipolar disorder  Past Medical History:  Past Medical History:  Diagnosis Date  . Anal fissure   . Bipolar affective disorder ([place])   . CKD (chronic kidney disease)    Dr Holley Raring [name] 4  . Colon polyps   . Diverticulitis   . Diverticulitis   . Family history of adverse reaction to anesthesia    mom - PONV  . GERD (gastroesophageal reflux disease)   . Headache    migraines - none over 10 yrs  . Heart murmur   . History of hiatal hernia   . Hypertension   . Pancreatitis    Valproic acid  . Vertigo     Past Surgical History:  Procedure Laterality Date  . A/V FISTULAGRAM Left 04/14/2017   Procedure: A/V Fistulagram;  Surgeon: Katha Cabal, MD;  Location: [place] [facility];  Service: Cardiovascular;  Laterality: Left;  . A/V FISTULAGRAM Left 06/09/2017   Procedure: A/V FISTULAGRAM;  Surgeon: Katha Cabal, MD;  Location: [place] [facility];  Service: Cardiovascular;  Laterality: Left;  . ABDOMINAL HYSTERECTOMY  1990 ?  . AV FISTULA PLACEMENT  4/30  . BREAST EXCISIONAL BIOPSY Left 1994   neg surgical bx  . CHOLECYSTECTOMY  2003  . COLONOSCOPY  2014   Dr. Jamal Collin  . COLONOSCOPY WITH PROPOFOL N/A 09/24/2015   Procedure: COLONOSCOPY WITH random colon byopies.;  Surgeon: Lucilla Lame, MD;  Location: [place];  Service:  Endoscopy;  Laterality: N/A;  . ESOPHAGOGASTRODUODENOSCOPY (EGD) WITH PROPOFOL N/A 09/24/2015   Procedure: ESOPHAGOGASTRODUODENOSCOPY (EGD) ;  Surgeon: Lucilla Lame, MD;  Location: [place];  Service: Endoscopy;  Laterality: N/A;  . EYE SURGERY    . PERIPHERAL VASCULAR CATHETERIZATION N/A 05/29/2015   Procedure: A/V Shuntogram/Fistulagram;  Surgeon: Katha Cabal, MD;  Location: [place] [facility];  Service:  Cardiovascular;  Laterality: N/A;  . PERIPHERAL VASCULAR CATHETERIZATION N/A 05/29/2015   Procedure: A/V Shunt Intervention;  Surgeon: Katha Cabal, MD;  Location: [place] [facility];  Service: Cardiovascular;  Laterality: N/A;  . POLYPECTOMY  09/24/2015   Procedure: POLYPECTOMY INTESTINAL;  Surgeon: Lucilla Lame, MD;  Location: [place];  Service: Endoscopy;;  cecal polyp ascending polyp   Family History:  Family History  Problem Relation Age of Onset  . Stroke Father   . Hypertension Father   . Breast cancer Other   . Colon cancer Neg Hx   . Liver disease Neg Hx    Family Psychiatric  History: Mood disorder Social History:  Social History   Substance and Sexual Activity  Alcohol Use No  . Alcohol/week: 0.0 oz     Social History   Substance and Sexual Activity  Drug Use No    Social History   Socioeconomic History  . Marital status: Single    Spouse name: Not on file  . Number of children: 1  . Years of education: Not on file  . Highest education level: Not on file  Occupational History  . Occupation: Surveyor, quantity: [employer]  Social Needs  . Financial resource strain: Not on file  . Food insecurity:    Worry: Not on file    Inability: Not on file  . Transportation needs:    Medical: Not on file    Non-medical: Not on file  Tobacco Use  . Smoking status: Never Smoker  . Smokeless tobacco: Never Used  Substance and Sexual Activity  . Alcohol use: No    Alcohol/week: 0.0 oz  . Drug use: No  . Sexual activity: Not Currently  Lifestyle  . Physical activity:    Days per week: Not on file    Minutes per session: Not on file  . Stress: Not on file  Relationships  . Social connections:    Talks on phone: Not on file    Gets together: Not on file    Attends religious service: Not on file    Active member of club or organization: Not on file    Attends meetings of clubs or organizations: Not on file    Relationship status: Not on file   Other Topics Concern  . 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   Additional Social History:    History of alcohol / drug use?: No history of alcohol / drug abuse                    Sleep: Fair  Appetite:  Good  Current Medications: Current Facility-Administered Medications  Medication Dose Route Frequency Provider Last Rate Last Dose  . 0.9 %  sodium chloride infusion   Intravenous Continuous Algernon Huxley, MD 20 mL/hr at 10/19/17 [phone]    . acetaminophen (TYLENOL) tablet 325 mg  325 mg Oral BID PRN Babe Anthis, [name], MD   325 mg at 10/19/17 1727  . alum &  mag hydroxide-simeth (MAALOX/MYLANTA) 200-200-20 MG/5ML suspension 30 mL  30 mL Oral Q4H PRN Sarabella Caprio, [name], MD   30 mL at 10/17/17 2117  . bisacodyl (DULCOLAX) EC tablet 5 mg  5 mg Oral Daily PRN Zohra Clavel T, MD      . brimonidine (ALPHAGAN) 0.2 % ophthalmic solution 1 drop  1 drop Both Eyes BID Le Faulcon, [name], MD   1 drop at 10/19/17 0827  . ceFAZolin (ANCEF) IVPB 2g/100 mL premix  2 g Intravenous 30 min Pre-Op Algernon Huxley, MD      . cyanocobalamin ((VITAMIN B-12)) injection 1,000 mcg  1,000 mcg Intramuscular Q30 days Aliegha Paullin, [name], MD   1,000 mcg at 10/17/17 1723  . dicyclomine (BENTYL) tablet 20 mg  20 mg Oral TID AC Poet Hineman T, MD   20 mg at 10/18/17 1731  . epoetin alfa (EPOGEN,PROCRIT) injection 4,000 Units  4,000 Units Intravenous Q M,W,F-HD [name], MD   4,000 Units at 10/15/17 1400  . feeding supplement (NEPRO CARB STEADY) liquid 237 mL  237 mL Oral BID BM Candiss Norse, [name], MD   237 mL at 10/18/17 1021  . fentaNYL (SUBLIMAZE) injection 25 mcg  25 mcg Intravenous Q5 min PRN [name], MD      . haloperidol (HALDOL) tablet 5 mg  5 mg Oral Q8H PRN [name], [name], MD   5 mg at 10/16/17 0242   Or  . haloperidol lactate (HALDOL) injection 5 mg  5 mg Intramuscular Q8H PRN [name], [name], MD   5 mg at  10/01/17 0908  . lidocaine (LIDODERM) 5 % 1 patch  1 patch Transdermal Q24H [name], [name], MD   1 patch at 10/19/17 1728  . lidocaine-prilocaine (EMLA) cream 1 application  1 application Topical Q M,W,F Kimberle Stanfill, [name], MD   1 application at 56/21/30 1219  . LORazepam (ATIVAN) injection 2 mg  2 mg Intramuscular Q4H PRN Guelda Batson, [name], MD   2 mg at 10/14/17 0134  . LORazepam (ATIVAN) injection 2 mg  2 mg Intramuscular Q6H PRN Darivs Lunden, [name], MD   2 mg at 10/15/17 1152  . LORazepam (ATIVAN) tablet 2 mg  2 mg Oral QHS [name], [name], MD   2 mg at 10/18/17 2205  . magnesium hydroxide (MILK OF MAGNESIA) suspension 30 mL  30 mL Oral Daily PRN Sherwin Hollingshed T, MD      . metoprolol tartrate (LOPRESSOR) tablet 12.5 mg  12.5 mg Oral BID [name], MD   12.5 mg at 10/19/17 1727  . midodrine (PROAMATINE) tablet 10 mg  10 mg Oral TID WC [name], MD   10 mg at 10/18/17 1731  . multivitamin (RENA-VIT) tablet 1 tablet  1 tablet Oral QHS [name], [name], MD   1 tablet at 10/18/17 2205  . OLANZapine zydis (ZYPREXA) disintegrating tablet 15 mg  15 mg Oral QHS [name], [name], MD   15 mg at 10/18/17 2205  . ondansetron (ZOFRAN) injection 4 mg  4 mg Intravenous Once PRN [name], MD      . pantoprazole (PROTONIX) EC tablet 40 mg  40 mg Oral Daily Loa Idler, [name], MD   40 mg at 10/18/17 0850  . pentafluoroprop-tetrafluoroeth (GEBAUERS) aerosol   Topical PRN [name], MD      . rOPINIRole (REQUIP) tablet 2 mg  2 mg Oral QHS [name], [name], MD   2 mg at 10/18/17 2205  . timolol (TIMOPTIC) 0.5 % ophthalmic solution 1 drop  1 drop Both Eyes BID Shantoria Ellwood, [name], MD   1 drop at 10/19/17 [phone]  . vitamin C (ASCORBIC ACID) tablet 500 mg  500 mg Oral BID [name], [name], MD   500 mg at 10/18/17 1731    Lab Results:  Results for orders placed or performed during the hospital encounter of 09/17/17 (from the past 48 hour(s))  Glucose, capillary     Status: None    Collection Time: 10/18/17  6:54 AM  Result Value Ref Range   Glucose-Capillary 89 65 - 99 mg/dL  Glucose, capillary     Status: Abnormal   Collection Time: 10/19/17  7:00 AM  Result Value Ref Range   Glucose-Capillary 124 (H) 65 - 99 mg/dL  Glucose, capillary     Status: None   Collection Time: 10/19/17  7:03 AM  Result Value Ref Range   Glucose-Capillary 93 65 - 99 mg/dL  Glucose, capillary     Status: Abnormal   Collection Time: 10/19/17 11:12 AM  Result Value Ref Range   Glucose-Capillary 109 (H) 65 - 99 mg/dL    Blood Alcohol level:  Lab Results  Component Value Date   ETH <10 78/24/2353    Metabolic Disorder Labs: Lab Results  Component Value Date   HGBA1C 4.8 09/18/2017   MPG 91.06 09/18/2017   No results found for: PROLACTIN Lab Results  Component Value Date   CHOL 251 (H) 09/18/2017   TRIG 124 09/18/2017   HDL 66 09/18/2017   CHOLHDL 3.8 09/18/2017   VLDL 25 09/18/2017   LDLCALC 160 (H) 09/18/2017   LDLCALC 43 05/14/2014    Physical Findings: AIMS: Facial and Oral Movements Muscles of Facial Expression: None, normal Lips and Perioral Area: None, normal Jaw: None, normal Tongue: None, normal,Extremity Movements Upper (arms, wrists, hands, fingers): None, normal Lower (legs, knees, ankles, toes): None, normal, Trunk Movements Neck, shoulders, hips: None, normal, Overall Severity Severity of abnormal movements (highest score from questions above): None, normal Incapacitation due to abnormal movements: None, normal Patient's awareness of abnormal movements (rate only patient's report): No Awareness, Dental Status Current problems with teeth and/or dentures?: No Does patient usually wear dentures?: No  CIWA:    COWS:     Musculoskeletal: Strength & Muscle Tone: within normal limits Gait & Station: normal Patient leans: N/A  Psychiatric Specialty Exam: Physical Exam  Nursing note and vitals reviewed. Constitutional: She appears well-developed and  well-nourished.  HENT:  Head: Normocephalic and atraumatic.  Eyes: Pupils are equal, round, and reactive to light. Conjunctivae are normal.  Neck: Normal range of motion.  Cardiovascular: Regular rhythm and normal heart sounds.  Respiratory: Effort normal. No respiratory distress.  GI: Soft.  Musculoskeletal: Normal range of motion.  Neurological: She is alert.  Skin: Skin is warm and dry.  Psychiatric: Judgment normal. Her mood appears anxious. Her speech is delayed. She is slowed. Thought content is not paranoid. Cognition and memory are impaired. She expresses no homicidal and no suicidal ideation.    Review of Systems  Constitutional: Negative.   HENT: Negative.   Eyes: Negative.   Respiratory: Negative.   Cardiovascular: Negative.   Gastrointestinal: Negative.   Musculoskeletal: Negative.   Skin: Negative.   Neurological: Negative.   Psychiatric/Behavioral: Positive for memory loss. Negative for depression and suicidal ideas.    Blood pressure (!) 120/56, pulse 68, temperature 97.7 F (36.5 C), resp. rate 17, height 5' 3.5" (1.613 m), weight 73 kg (161 lb), SpO2 99 %.Body mass index is 28.07 kg/m.  General Appearance: Casual  Eye Contact:  Good  Speech:  Normal Rate  Volume:  Decreased  Mood:  Anxious  Affect:  Congruent  Thought Process:  Goal Directed  Orientation:  Full (Time, Place, and Person)  Thought Content:  Logical  Suicidal Thoughts:  No  Homicidal Thoughts:  No  Memory:  Immediate;   Fair Recent;   Fair Remote;   Fair  Judgement:  Fair  Insight:  Fair  Psychomotor Activity:  Decreased  Concentration:  Concentration: Fair  Recall:  [employer] of Knowledge:  Fair  Language:  Fair  Akathisia:  No  Handed:  Right  AIMS (if indicated):     Assets:  Desire for Improvement Housing Physical Health Resilience Social Support  ADL's:  Intact  Cognition:  Impaired,  Mild  Sleep:  Number of Hours: 5     Treatment Plan Summary: Daily contact with  patient to assess and evaluate symptoms and progress in treatment, Medication management and Plan Patient has shown dramatic progress.  Continues to improve although I think she is still on the upswing.  ECT today well-tolerated and next scheduled for Wednesday.  She is also scheduled on Wednesday to have her graft readdressed.  It is possible that we may be able to look for discharge by the end of the week.  I had been told that the plan was for the patient to go stay with her son but she tells me this evening that she is ambivalent about it.  I will not feel comfortable with her going home entirely by herself right now so we will need to make sure there is some plan in place.  No change to medicine for today.  Alethia Berthold, MD 10/19/2017, 8:22 PM

## 2017-10-19 NOTE — Plan of Care (Signed)
Patient is alert and oriented. Denies SI, HI and AVH. Patient believes the only reason she is now in the hospital is to have a fistula procedure. Patient states Dr. Weber Cooks will probably release her tomorrow there is no note to validate patient will be discharged tomorrow. Patient was very irritable today due to not being able to eat. Dr. Lucky Cowboy called in to state patient will have fistula procedure on Thursday and will need to be NPO starting Wednesday 11 P.M. And is able to eat at this time. Patient did not have dialysis treatment today. Dialysis treatment has changed to Tues, Thurs, and Saturday rotation. Patient did not attend groups today but instead stayed in bed until dinner time. Patient had ECT treatment this morning and did not complain of any headaches, or pain; able to answer questions. Nurse will continue to monitor patient. Q 15 minute checks for safety to continue. Problem: Pain Managment: Goal: General experience of comfort will improve Outcome: Progressing   Problem: Education: Goal: Will be free of psychotic symptoms Outcome: Progressing Goal: Knowledge of the prescribed therapeutic regimen will improve Outcome: Progressing   Problem: Coping: Goal: Ability to cope will improve Outcome: Progressing Goal: Ability to verbalize feelings will improve Outcome: Progressing

## 2017-10-19 NOTE — Anesthesia Procedure Notes (Signed)
Date/Time: 10/19/2017 10:48 AM Performed by: Dionne Bucy, CRNA Pre-anesthesia Checklist: Patient identified, Emergency Drugs available, Suction available and Patient being monitored Patient Re-evaluated:Patient Re-evaluated prior to induction Oxygen Delivery Method: Circle system utilized Preoxygenation: Pre-oxygenation with 100% oxygen Induction Type: IV induction Ventilation: Mask ventilation without difficulty and Mask ventilation throughout procedure Airway Equipment and Method: Bite block Placement Confirmation: positive ETCO2 Dental Injury: Teeth and Oropharynx as per pre-operative assessment

## 2017-10-19 NOTE — Anesthesia Preprocedure Evaluation (Signed)
Anesthesia Evaluation  Patient identified by MRN, date of birth, ID band Patient awake    Reviewed: Allergy & Precautions, NPO status , Patient's Chart, lab work & pertinent test results  History of Anesthesia Complications Negative for: history of anesthetic complications  Airway Mallampati: II  TM Distance: >3 FB Neck ROM: Full    Dental no notable dental hx.    Pulmonary neg pulmonary ROS, neg sleep apnea, neg COPD,    breath sounds clear to auscultation- rhonchi (-) wheezing      Cardiovascular hypertension, Pt. on medications (-) CAD, (-) Past MI, (-) Cardiac Stents and (-) CABG  Rhythm:Regular Rate:Normal - Systolic murmurs and - Diastolic murmurs    Neuro/Psych  Headaches, PSYCHIATRIC DISORDERS Bipolar Disorder    GI/Hepatic Neg liver ROS, hiatal hernia, GERD  ,  Endo/Other  negative endocrine ROSneg diabetes  Renal/GU ESRF and DialysisRenal disease (last dialysis 10/16/17)     Musculoskeletal negative musculoskeletal ROS (+)   Abdominal (+) - obese,   Peds  Hematology  (+) anemia ,   Anesthesia Other Findings Past Medical History: No date: Anal fissure No date: Bipolar affective disorder ([place]) No date: CKD (chronic kidney disease)     Comment:  Dr Holley Raring [name] 4 No date: Colon polyps No date: Diverticulitis No date: Diverticulitis No date: Family history of adverse reaction to anesthesia     Comment:  mom - PONV No date: GERD (gastroesophageal reflux disease) No date: Headache     Comment:  migraines - none over 10 yrs No date: Heart murmur No date: History of hiatal hernia No date: Hypertension No date: Pancreatitis     Comment:  Valproic acid No date: Vertigo   Reproductive/Obstetrics                             Anesthesia Physical Anesthesia Plan  ASA: IV  Anesthesia Plan: General   Post-op Pain Management:    Induction: Intravenous  PONV Risk Score and  Plan: 2 and Ondansetron  Airway Management Planned: Mask  Additional Equipment:   Intra-op Plan:   Post-operative Plan:   Informed Consent: I have reviewed the patients History and Physical, chart, labs and discussed the procedure including the risks, benefits and alternatives for the proposed anesthesia with the patient or authorized representative who has indicated his/her understanding and acceptance.   Dental advisory given  Plan Discussed with: CRNA and Anesthesiologist  Anesthesia Plan Comments:         Anesthesia Quick Evaluation

## 2017-10-19 NOTE — H&P (Signed)
Joyce [name] is an 72 y.o. female.   Chief Complaint: Patient reports that she is feeling better HPI: Patient with a long history of bipolar disorder who is recovering from what appears to been a severe psychotic depression  Past Medical History:  Diagnosis Date  . Anal fissure   . Bipolar affective disorder ([place])   . CKD (chronic kidney disease)    Dr Holley Raring [name] 4  . Colon polyps   . Diverticulitis   . Diverticulitis   . Family history of adverse reaction to anesthesia    mom - PONV  . GERD (gastroesophageal reflux disease)   . Headache    migraines - none over 10 yrs  . Heart murmur   . History of hiatal hernia   . Hypertension   . Pancreatitis    Valproic acid  . Vertigo     Past Surgical History:  Procedure Laterality Date  . A/V FISTULAGRAM Left 04/14/2017   Procedure: A/V Fistulagram;  Surgeon: Katha Cabal, MD;  Location: [facility];  Service: Cardiovascular;  Laterality: Left;  . A/V FISTULAGRAM Left 06/09/2017   Procedure: A/V FISTULAGRAM;  Surgeon: Katha Cabal, MD;  Location: [facility];  Service: Cardiovascular;  Laterality: Left;  . ABDOMINAL HYSTERECTOMY  1990 ?  . AV FISTULA PLACEMENT  4/30  . BREAST EXCISIONAL BIOPSY Left 1994   neg surgical bx  . CHOLECYSTECTOMY  2003  . COLONOSCOPY  2014   Dr. Jamal Collin  . COLONOSCOPY WITH PROPOFOL N/A 09/24/2015   Procedure: COLONOSCOPY WITH random colon byopies.;  Surgeon: Lucilla Lame, MD;  Location: [facility];  Service: Endoscopy;  Laterality: N/A;  . ESOPHAGOGASTRODUODENOSCOPY (EGD) WITH PROPOFOL N/A 09/24/2015   Procedure: ESOPHAGOGASTRODUODENOSCOPY (EGD) ;  Surgeon: Lucilla Lame, MD;  Location: [facility];  Service: Endoscopy;  Laterality: N/A;  . EYE SURGERY    . PERIPHERAL VASCULAR CATHETERIZATION N/A 05/29/2015   Procedure: A/V Shuntogram/Fistulagram;  Surgeon: Katha Cabal, MD;  Location: [facility];  Service: Cardiovascular;  Laterality: N/A;  .  PERIPHERAL VASCULAR CATHETERIZATION N/A 05/29/2015   Procedure: A/V Shunt Intervention;  Surgeon: Katha Cabal, MD;  Location: [facility];  Service: Cardiovascular;  Laterality: N/A;  . POLYPECTOMY  09/24/2015   Procedure: POLYPECTOMY INTESTINAL;  Surgeon: Lucilla Lame, MD;  Location: [facility];  Service: Endoscopy;;  cecal polyp ascending polyp    Family History  Problem Relation Age of Onset  . Stroke Father   . Hypertension Father   . Breast cancer Other   . Colon cancer Neg Hx   . Liver disease Neg Hx    Social History:  reports that she has never smoked. She has never used smokeless tobacco. She reports that she does not drink alcohol or use drugs.  Allergies:  Allergies  Allergen Reactions  . Morphine And Related Shortness Of Breath    Pt reports chest pain and difficulty breathing.  . Indomethacin Hives  . Pollen Extract Other (See Comments)    Sinus problems and HA    Medications Prior to Admission  Medication Sig Dispense Refill  . acetaminophen (TYLENOL) 325 MG tablet Take 325 mg 2 (two) times daily as needed by mouth for moderate pain or headache.     . brimonidine (ALPHAGAN) 0.2 % ophthalmic solution Place 2 (two) times daily into both eyes.    . carbamazepine (TEGRETOL) 200 MG tablet Take 200 mg by mouth 2 (two) times daily.    Marland Kitchen  cephALEXin (KEFLEX) 500 MG capsule Take 1 capsule (500 mg total) by mouth every 12 (twelve) hours. 5 capsule 0  . cholecalciferol (VITAMIN D) 1000 UNITS tablet Take 1,000 Units by mouth daily.    . cyanocobalamin (,VITAMIN B-12,) 1000 MCG/ML injection Inject 1,000 mcg into the muscle every 30 (thirty) days.    Marland Kitchen dicyclomine (BENTYL) 20 MG tablet Take 1 tablet (20 mg total) by mouth 3 (three) times daily before meals. 30 tablet 0  . lidocaine-prilocaine (EMLA) cream Apply 1 application every Monday, Wednesday, and Friday topically. At dialysis  3  . midodrine (PROAMATINE) 10 MG tablet Take 10 mg by mouth daily.     .  pantoprazole (PROTONIX) 40 MG tablet TAKE 1 TABLET EVERY DAY 90 tablet 3  . pramipexole (MIRAPEX) 0.125 MG tablet Take 0.125 mg by mouth daily at 2 PM.  3  . QUEtiapine (SEROQUEL) 25 MG tablet Take 50 mg by mouth at bedtime.     . timolol (TIMOPTIC) 0.5 % ophthalmic solution Place 1 drop 2 (two) times daily into both eyes.     . ARIPiprazole (ABILIFY) 2 MG tablet Take 2 mg by mouth daily.    . bisacodyl (DULCOLAX) 5 MG EC tablet Take 1 tablet (5 mg total) by mouth daily as needed for moderate constipation. 30 tablet 0    Results for orders placed or performed during the hospital encounter of 09/17/17 (from the past 48 hour(s))  Glucose, capillary     Status: Abnormal   Collection Time: 10/17/17 12:43 PM  Result Value Ref Range   Glucose-Capillary 109 (H) 65 - 99 mg/dL  Glucose, capillary     Status: None   Collection Time: 10/18/17  6:54 AM  Result Value Ref Range   Glucose-Capillary 89 65 - 99 mg/dL  Glucose, capillary     Status: Abnormal   Collection Time: 10/19/17  7:00 AM  Result Value Ref Range   Glucose-Capillary 124 (H) 65 - 99 mg/dL  Glucose, capillary     Status: None   Collection Time: 10/19/17  7:03 AM  Result Value Ref Range   Glucose-Capillary 93 65 - 99 mg/dL   No results found.  Review of Systems  Constitutional: Positive for malaise/fatigue.  HENT: Negative.   Eyes: Negative.   Respiratory: Negative.   Cardiovascular: Negative.   Gastrointestinal: Negative.   Musculoskeletal: Negative.   Skin: Negative.   Neurological: Negative.   Psychiatric/Behavioral: Positive for memory loss. Negative for depression, hallucinations, substance abuse and suicidal ideas. The patient is not nervous/anxious and does not have insomnia.     Blood pressure (!) 103/49, pulse 88, temperature 97.8 F (36.6 C), temperature source Oral, resp. rate 18, height 5' 3.5" (1.613 m), weight 73 kg (161 lb), SpO2 99 %. Physical Exam  Nursing note and vitals reviewed. Constitutional: She  appears well-developed and well-nourished.  HENT:  Head: Normocephalic and atraumatic.  Eyes: Pupils are equal, round, and reactive to light. Conjunctivae are normal.  Neck: Normal range of motion.  Cardiovascular: Regular rhythm and normal heart sounds.  Respiratory: Effort normal. No respiratory distress.  GI: Soft.  Musculoskeletal: Normal range of motion.  Neurological: She is alert.  Skin: Skin is warm and dry.  Psychiatric: Judgment normal. Her affect is blunt. Her speech is delayed. She is slowed. Thought content is not paranoid. Cognition and memory are impaired. She expresses no homicidal and no suicidal ideation. She exhibits abnormal recent memory.     Assessment/Plan Patient clearly more energetic more lucid more interactive  although still a little bit sluggish.  Alethia Berthold, MD 10/19/2017, 10:40 AM

## 2017-10-19 NOTE — Anesthesia Post-op Follow-up Note (Signed)
Anesthesia QCDR form completed.        

## 2017-10-19 NOTE — Progress Notes (Signed)
Patient has remained pleasant and cooperative and able to express her needs and feelings. Compliant with medications. Denying thoughts of self harm. No major concerns. Pt is currently NPO for an upcoming procedure. Safety precautions maintained.

## 2017-10-19 NOTE — Procedures (Signed)
ECT SERVICES Physician's Interval Evaluation & Treatment Note  Patient Identification: Karel Mowers MRN:  [id number] Date of Evaluation:  10/19/2017 TX #: 5  MADRS:   MMSE:   P.E. Findings:  Patient is more awake more alert more physically active  Psychiatric Interval Note:  More interactive and lucid mood improved.  Subjective:  Patient is a 72 y.o. female seen for evaluation for Electroconvulsive Therapy. Eating more not feeling depressed or hopeless.  No psychotic report.  Treatment Summary:   []   Right Unilateral             [x]  Bilateral   % Energy : 1.0 ms 50%   Impedance: 1940 ohms  Seizure Energy Index: 5570 V squared  Postictal Suppression Index: 80%  Seizure Concordance Index: 96%  Medications  Pre Shock: 70 mg Brevital 80 mg succinylcholine  Post Shock: None  Seizure Duration: 35 seconds by EMG 51 seconds by EEG   Comments: Tentatively plan for follow-up inpatient on Wednesday  Lungs:  [x]   Clear to auscultation               []  Other:   Heart:    [x]   Regular rhythm             []  irregular rhythm    [x]   Previous H&P reviewed, patient examined and there are NO CHANGES                 []   Previous H&P reviewed, patient examined and there are changes noted.   Alethia Berthold, MD 4/1/201910:42 AM

## 2017-10-19 NOTE — Progress Notes (Addendum)
Recreation Therapy Notes  Date: 04.01.2019  Time: 9:30 am  Location: Craft Room  Behavioral response: N/A  Intervention Topic: Problem Solving  Discussion/Intervention: Patient did not attend group.  Clinical Observations/Feedback:  Patient did not attend group.  Guled Gahan LRT/CTRS         Tomi Paddock 10/19/2017 1:19 PM

## 2017-10-19 NOTE — Plan of Care (Signed)
Pt alert and oriented, able to express her feelings. Compliant with treatment

## 2017-10-19 NOTE — Transfer of Care (Signed)
Immediate Anesthesia Transfer of Care Note  Patient: Joyce [name]  Procedure(s) Performed: ECT TX  Patient Location: PACU  Anesthesia Type:General  Level of Consciousness: sedated  Airway & Oxygen Therapy: Patient Spontanous Breathing and Patient connected to face mask oxygen  Post-op Assessment: Report given to RN and Post -op Vital signs reviewed and stable  Post vital signs: Reviewed and stable  Last Vitals:  Vitals Value Taken Time  BP 149/55 10/19/2017 10:59 AM  Temp    Pulse 97 10/19/2017 11:01 AM  Resp 24 10/19/2017 11:01 AM  SpO2 100 % 10/19/2017 11:01 AM  Vitals shown include unvalidated device data.  Last Pain:  Vitals:   10/19/17 0916  TempSrc: Oral  PainSc:       Patients Stated Pain Goal: 0 (15/72/62 0355)  Complications: No apparent anesthesia complications

## 2017-10-20 ENCOUNTER — Other Ambulatory Visit: Payer: Self-pay | Admitting: Psychiatry

## 2017-10-20 LAB — CBC
HEMATOCRIT: 26.8 % — AB (ref 35.0–47.0)
Hemoglobin: 9.5 g/dL — ABNORMAL LOW (ref 12.0–16.0)
MCH: 33.6 pg (ref 26.0–34.0)
MCHC: 35.3 g/dL (ref 32.0–36.0)
MCV: 95.2 fL (ref 80.0–100.0)
Platelets: 242 10*3/uL (ref 150–440)
RBC: 2.82 MIL/uL — ABNORMAL LOW (ref 3.80–5.20)
RDW: 14.9 % — ABNORMAL HIGH (ref 11.5–14.5)
WBC: 5.3 10*3/uL (ref 3.6–11.0)

## 2017-10-20 LAB — RENAL FUNCTION PANEL
ANION GAP: 12 (ref 5–15)
Albumin: 3.3 g/dL — ABNORMAL LOW (ref 3.5–5.0)
BUN: 53 mg/dL — ABNORMAL HIGH (ref 6–20)
CHLORIDE: 101 mmol/L (ref 101–111)
CO2: 26 mmol/L (ref 22–32)
Calcium: 9.3 mg/dL (ref 8.9–10.3)
Creatinine, Ser: 5.56 mg/dL — ABNORMAL HIGH (ref 0.44–1.00)
GFR calc Af Amer: 8 mL/min — ABNORMAL LOW (ref >60)
GFR, EST NON AFRICAN AMERICAN: 7 mL/min — AB (ref >60)
Glucose, Bld: 112 mg/dL — ABNORMAL HIGH (ref 65–99)
POTASSIUM: 4.4 mmol/L (ref 3.5–5.1)
Phosphorus: 5.4 mg/dL — ABNORMAL HIGH (ref 2.5–4.6)
Sodium: 139 mmol/L (ref 135–145)

## 2017-10-20 MED ORDER — FAMOTIDINE 20 MG PO TABS
20.0000 mg | ORAL_TABLET | Freq: Two times a day (BID) | ORAL | Status: DC
  Administered 2017-10-21 – 2017-10-22 (×2): 20 mg via ORAL
  Filled 2017-10-20 (×2): qty 1

## 2017-10-20 NOTE — B[facility] Group Notes (Signed)
10/20/2017 1PM  Type of Therapy/Topic:  Group Therapy:  Feelings about Diagnosis  Participation Level:  None   Description of Group:   This group will allow patients to explore their thoughts and feelings about diagnoses they have received. Patients will be guided to explore their level of understanding and acceptance of these diagnoses. Facilitator will encourage patients to process their thoughts and feelings about the reactions of others to their diagnosis and will guide patients in identifying ways to discuss their diagnosis with significant others in their lives. This group will be process-oriented, with patients participating in exploration of their own experiences, giving and receiving support, and processing challenge from other group members.   Therapeutic Goals: 1. Patient will demonstrate understanding of diagnosis as evidenced by identifying two or more symptoms of the disorder 2. Patient will be able to express two feelings regarding the diagnosis 3. Patient will demonstrate their ability to communicate their needs through discussion and/or role play  Summary of Patient Progress: Denishia came into group briefly and left shortly after. No participation.       Therapeutic Modalities:   Cognitive Behavioral Therapy Brief Therapy Feelings Identification    Darin Engels, [place] 10/20/2017 2:01 PM

## 2017-10-20 NOTE — Progress Notes (Signed)
[facility]  ROUNDING NOTE   Subjective:   Seen and examined on hemodialysis. Tolerating treatment well. Sitter at chairside.     HEMODIALYSIS FLOWSHEET:  Blood Flow Rate (mL/min): 400 mL/min Arterial Pressure (mmHg): -120 mmHg Venous Pressure (mmHg): 260 mmHg Transmembrane Pressure (mmHg): 690 mmHg Ultrafiltration Rate (mL/min): 830 mL/min Dialysate Flow Rate (mL/min): 600 ml/min Conductivity: Machine : 14 Conductivity: Machine : 14 Dialysis Fluid Bolus: Normal Saline Bolus Amount (mL): 250 mL Dialysate Change: (3k 2.5ca)   Objective:  Vital signs in last 24 hours:  Temp:  [98.1 F (36.7 C)-98.3 F (36.8 C)] 98.1 F (36.7 C) (04/02 1419) Pulse Rate:  [70-78] 71 (04/02 1615) Resp:  [19-20] 20 (04/02 1615) BP: (106-133)/(57-66) 106/66 (04/02 1615) SpO2:  [98 %-100 %] 100 % (04/02 1445)  Weight change:  Filed Weights   10/19/17 0916  Weight: 73 kg (161 lb)    Intake/Output: I/O last 3 completed shifts: In: 440 [P.O.:240; I.V.:200] Out: -    Intake/Output this shift:  Total I/O In: 360 [P.O.:360] Out: -   Physical Exam: General: NAD, laying in bed  Head: Normocephalic, atraumatic. Hearing intact  Eyes: Anicteric  Neck: Supple, trachea midline  Lungs:  Clear to auscultation  Heart: Regular rate and rhythm  Abdomen:  Soft, nontender  Extremities: no peripheral edema.  Psych:  Interactive today  Neurologic: Nonfocal, moving all four extremities  Skin: No lesions  Access: Left AVF    Basic Metabolic Panel: Recent Labs  Lab 10/13/17 2206 10/15/17 1155 10/17/17 1009 10/20/17 1439  NA  --   --  138 139  K  --   --  3.9 4.4  CL  --   --  98* 101  CO2  --   --  27 26  GLUCOSE  --   --  131* 112*  BUN  --   --  45* 53*  CREATININE  --   --  5.31* 5.56*  CALCIUM  --   --  9.6 9.3  PHOS 3.5 3.6 2.9 5.4*    Liver Function Tests: Recent Labs  Lab 10/17/17 1009 10/20/17 1439  ALBUMIN 3.3* 3.3*   No results for input(s): LIPASE,  AMYLASE in the last 168 hours. No results for input(s): AMMONIA in the last 168 hours.  CBC: Recent Labs  Lab 10/17/17 1009 10/20/17 1439  WBC 6.9 5.3  HGB 9.8* 9.5*  HCT 29.0* 26.8*  MCV 96.2 95.2  PLT 229 242    Cardiac Enzymes: No results for input(s): CKTOTAL, CKMB, CKMBINDEX, TROPONINI in the last 168 hours.  BNP: Invalid input(s): POCBNP  CBG: Recent Labs  Lab 10/17/17 1243 10/18/17 0654 10/19/17 0700 10/19/17 0703 10/19/17 1112  GLUCAP 109* 89 124* 93 109*    Microbiology: Results for orders placed or performed during the hospital encounter of 09/01/17  Urine culture     Status: Abnormal   Collection Time: 09/01/17  4:32 PM  Result Value Ref Range Status   Specimen Description   Final    URINE, RANDOM Performed at [facility], [address], [place], [place] [address]    Special Requests   Final    NONE Performed at [facility], [address] [place],  [address]    Culture MULTIPLE SPECIES PRESENT, SUGGEST RECOLLECTION (A)  Final   Report Status 09/03/2017 FINAL  Final  Culture, blood (routine x 2)     Status: None   Collection Time: 09/01/17  6:05 PM  Result Value Ref Range  Status   Specimen Description BLOOD RIGHT HAND  Final   Special Requests   Final    BOTTLES DRAWN AEROBIC AND ANAEROBIC Blood Culture results may not be optimal due to an excessive volume of blood received in culture bottles   Culture   Final    NO GROWTH 5 DAYS Performed at [facility], [address] [place], [place] [address]    Report Status 09/06/2017 FINAL  Final  Culture, blood (routine x 2)     Status: None   Collection Time: 09/01/17  6:34 PM  Result Value Ref Range Status   Specimen Description BLOOD RAC  Final   Special Requests   Final    BOTTLES DRAWN AEROBIC AND ANAEROBIC Blood Culture adequate volume   Culture   Final    NO GROWTH 5 DAYS Performed at [facility], [address] [place], [place]  [address]    Report Status 09/06/2017 FINAL  Final    Coagulation Studies: No results for input(s): LABPROT, INR in the last 72 hours.  Urinalysis: No results for input(s): COLORURINE, LABSPEC, PHURINE, GLUCOSEU, HGBUR, BILIRUBINUR, KETONESUR, PROTEINUR, UROBILINOGEN, NITRITE, LEUKOCYTESUR in the last 72 hours.  Invalid input(s): APPERANCEUR    Imaging: No results found.   Medications:   . sodium chloride 20 mL/hr at 10/19/17 0925  .  ceFAZolin (ANCEF) IV     . brimonidine  1 drop Both Eyes BID  . cyanocobalamin  1,000 mcg Intramuscular Q30 days  . dicyclomine  20 mg Oral TID AC  . epoetin (EPOGEN/PROCRIT) injection  4,000 Units Intravenous Q M,W,F-HD  . famotidine  20 mg Oral BID  . feeding supplement (NEPRO CARB STEADY)  237 mL Oral BID BM  . lidocaine  1 patch Transdermal Q24H  . lidocaine-prilocaine  1 application Topical Q M,W,F  . LORazepam  2 mg Oral QHS  . metoprolol tartrate  12.5 mg Oral BID  . midodrine  10 mg Oral TID WC  . multivitamin  1 tablet Oral QHS  . OLANZapine zydis  15 mg Oral QHS  . pantoprazole  40 mg Oral Daily  . rOPINIRole  2 mg Oral QHS  . timolol  1 drop Both Eyes BID  . vitamin C  500 mg Oral BID   acetaminophen, alum & mag hydroxide-simeth, bisacodyl, fentaNYL (SUBLIMAZE) injection, haloperidol **OR** haloperidol lactate, LORazepam, LORazepam, magnesium hydroxide, ondansetron (ZOFRAN) IV, pentafluoroprop-tetrafluoroeth  Assessment/ Plan:  [name] is a 72 y.o. white female with end stage renal disease on hemodialysis, diverticulosis, hypotension,  bipolar disorder, polycystic kidney disease   [facility]/MWF  1. ESRD on HD: Patient seen and evaluated during hemodialysis today.  Tolerating treatment well. - Continue TTS schedule. To help facilitate ECT treatments.   Complication of dialysis device: fistulogram scheduled for Thursday by Dr. Lucky Cowboy.   2. Anemia of CKD: Hemoglobin 9.5 -  Continue Epogen with  dialysis.  3. Secondary hyperparathyroidism: phosphorus and calcium at goal.  Not currently on binders.   4. Hypotension:  Maintain the patient on midodrine to help maintain blood pressure.   LOS: [place] 4/2/20194:22 PM

## 2017-10-20 NOTE — Progress Notes (Signed)
Pre hd 

## 2017-10-20 NOTE — Progress Notes (Signed)
Recreation Therapy Notes  Date: 04.02.2019  Time: 9:30 am  Location: Craft Room  Behavioral response: N/A  Intervention Topic: Communication  Discussion/Intervention: Patient did not attend group.  Clinical Observations/Feedback:  Patient did not attend group.  Joyce [name] LRT/CTRS          Joyce [name] 10/20/2017 10:50 AM

## 2017-10-20 NOTE — Progress Notes (Signed)
Patient continues to move around in chair, sitting up then lying back. Attempting to lie back and bending access arm. Venous needle pulled out slightly, approx 20cc blood loss. Patient instructed to keep access arm still for safety reasons. Vitals remain stable.

## 2017-10-20 NOTE — Plan of Care (Signed)
  Problem: Pain Managment: Goal: General experience of comfort will improve Outcome: Progressing   Problem: Education: Goal: Will be free of psychotic symptoms Outcome: Progressing Note:  Alert and oriented.  Denies AVH.  Goal: Knowledge of the prescribed therapeutic regimen will improve Outcome: Progressing   Problem: Coping: Goal: Ability to cope will improve Outcome: Progressing Goal: Ability to verbalize feelings will improve Outcome: Progressing   Problem: Health Behavior/Discharge Planning: Goal: Compliance with prescribed medication regimen will improve Outcome: Progressing   Problem: Nutritional: Goal: Ability to achieve adequate nutritional intake will improve Outcome: Progressing   Problem: Safety: Goal: Ability to remain free from injury will improve Outcome: Progressing Note:  Remains safe on the unit   Problem: Self-Concept: Goal: Ability to verbalize positive feelings about self will improve Outcome: Progressing   Problem: Activity: Goal: Ability to tolerate increased activity will improve Outcome: Progressing   Problem: Coping: Goal: Development of coping mechanisms to deal with changes in body function or appearance will improve Outcome: Progressing   Problem: Fluid Volume: Goal: Compliance with measures to maintain balanced fluid volume will improve Outcome: Progressing   Problem: Physical Regulation: Goal: Complications related to the disease process, condition or treatment will be avoided or minimized Outcome: Progressing   Problem: Activity: Goal: Sleeping patterns will improve Outcome: Progressing   Problem: Spiritual Needs Goal: Ability to function at adequate level Outcome: Progressing

## 2017-10-20 NOTE — Progress Notes (Signed)
HD Tx ended.    10/20/17 1730  Vital Signs  Pulse Rate 78  Resp 15  BP 91/77  BP Location Right Arm  BP Method Automatic  Patient Position (if appropriate) Sitting  During Hemodialysis Assessment  HD Safety Checks Performed Yes  Dialysis Fluid Bolus Normal Saline  Bolus Amount (mL) 250 mL  Intra-Hemodialysis Comments Tx completed;Tolerated well  Fistula / Graft Left Upper arm Arteriovenous fistula  Placement Date: 08/13/15   Placed prior to admission: Yes  Orientation: Left  Access Location: Upper arm  Access Type: Arteriovenous fistula  Site Condition No complications  Status Deaccessed

## 2017-10-20 NOTE — Plan of Care (Signed)
Patient DID NOT SLEEP All night; Precautionary checks every 15 minutes for safety maintained, room free of safety hazards, patient sustains no injury or falls during this shift.  Problem: Pain Managment: Goal: General experience of comfort will improve Outcome: Progressing   Problem: Education: Goal: Will be free of psychotic symptoms Outcome: Progressing Goal: Knowledge of the prescribed therapeutic regimen will improve Outcome: Progressing   Problem: Coping: Goal: Ability to cope will improve Outcome: Progressing Goal: Ability to verbalize feelings will improve Outcome: Progressing   Problem: Health Behavior/Discharge Planning: Goal: Compliance with prescribed medication regimen will improve Outcome: Progressing   Problem: Nutritional: Goal: Ability to achieve adequate nutritional intake will improve Outcome: Progressing   Problem: Safety: Goal: Ability to remain free from injury will improve Outcome: Progressing   Problem: Self-Concept: Goal: Ability to verbalize positive feelings about self will improve Outcome: Progressing   Problem: Activity: Goal: Ability to tolerate increased activity will improve Outcome: Progressing   Problem: Coping: Goal: Development of coping mechanisms to deal with changes in body function or appearance will improve Outcome: Progressing   Problem: Fluid Volume: Goal: Compliance with measures to maintain balanced fluid volume will improve Outcome: Progressing   Problem: Physical Regulation: Goal: Complications related to the disease process, condition or treatment will be avoided or minimized Outcome: Progressing   Problem: Activity: Goal: Sleeping patterns will improve Outcome: Progressing   Problem: Spiritual Needs Goal: Ability to function at adequate level Outcome: Progressing

## 2017-10-20 NOTE — Progress Notes (Signed)
Patient walking back to room with laundry basket and stated that she felt dizzy and sat in the floor.  MHT assisted patient to wheelchair and took to her room.  Vital signs obtained.

## 2017-10-20 NOTE — Progress Notes (Signed)
Post HDAssessment, no change from initial assessment. Pt had increasing venous pressures a known issue Pt is due to have access intervention.    10/20/17 1740  Neurological  Level of Consciousness Alert  Orientation Level Oriented X4  Respiratory  Respiratory Pattern Regular;Unlabored  Chest Assessment Chest expansion symmetrical  Bilateral Breath Sounds Clear;Diminished  Cardiac  Pulse Regular  Cardiac Rhythm NSR  Vascular  R Radial Pulse +2  L Radial Pulse +2  Integumentary  Integumentary (WDL) X  Skin Condition Dry  Musculoskeletal  Musculoskeletal (WDL) WDL  GU Assessment  Genitourinary (WDL) X  Genitourinary Symptoms Oliguria;Other (Comment) (ESKD)  Psychosocial  Psychosocial (WDL) X  Patient Behaviors Calm;Cooperative

## 2017-10-20 NOTE — [facility] Group Notes (Signed)
  10/20/2017  Time: 0900  Type of Therapy and Topic: Group Therapy: Goals Group: SMART Goals   Participation Level:  Active   Description of Group:   The purpose of a daily goals group is to assist and guide patients in setting recovery/wellness-related goals. The objective is to set goals as they relate to the crisis in which they were admitted. Patients will be using SMART goal modalities to set measurable goals. Characteristics of realistic goals will be discussed and patients will be assisted in setting and processing how one will reach their goal. Facilitator will also assist patients in applying interventions and coping skills learned in psycho-education groups to the SMART goal and process how one will achieve defined goal.   Therapeutic Goals:  -Patients will develop and document one goal related to or their crisis in which brought them into treatment.  -Patients will be guided by LCSW using SMART goal setting modality in how to set a measurable, attainable, realistic and time sensitive goal.  -Patients will process barriers in reaching goal.  -Patients will process interventions in how to overcome and successful in reaching goal.   Patient's Goal:  Pt continues to work towards their tx goals but has not yet reached them. Pt was able to appropriately participate in group discussion, and was able to offer support/validation to other group members. Pt reported her goal for the day is to, "talk to the doctor about getting discharged."   Therapeutic Modalities:  Motivational Interviewing  Cognitive Behavioral Therapy  Crisis Intervention Model  SMART goals setting  Alden Hipp, MSW, LCSW Clinical Social Worker 10/20/2017 9:48 AM

## 2017-10-20 NOTE — Progress Notes (Signed)
Patient ID: Joyce [name], female   DOB: [birth year], 72 y.o.   MRN: [id number] Improved Level of Consciousness, logical, coherent and thought content and processing appropriate; Worried about when Fistula will be repaired and when the Surgery will be coming up, restless slightly, c/o 8/10 chest pain, requested for & received Tylenol 650 mg PO PRN. Scheduled to have HD this morning.

## 2017-10-20 NOTE — Progress Notes (Signed)
HD initiated via L AVF using 15g needles. Arterial needle cannulated without difficulty. Venous needle cannulated x2. First needle was painful to patient and had to be removed. Recannulated successfully. Patient currently without complaints. 3k bath and 1.5L goal as ordered. Continue to monitor.

## 2017-10-20 NOTE — Progress Notes (Signed)
[facility] MD Progress Note  10/20/2017 5:23 PM Joyce [name]  MRN:  [id number] Subjective: 72 year old woman with bipolar disorder gradually reviewed responding to ECT.  Patient was in bed this morning.  Keeps asking about discharge but does not seem to be making much of an effort to take care of herself.  She expresses disappointment with not getting her dialysis graft repaired sooner. Principal Problem: Bipolar affective disorder, current episode manic with psychotic symptoms ([place]) Diagnosis:   Patient Active Problem List   Diagnosis Date Noted  . Palliative care encounter [Z51.5]   . Bipolar affective disorder, current episode depressed with psychotic symptoms ([place]) [F31.2] 09/17/2017  . Bipolar I disorder, most recent episode (or current) manic ([place]) [F31.10] 09/02/2017  . Acute delirium [R41.0] 09/02/2017  . Altered mental status [R41.82]   . Acute encephalopathy [G93.40] 09/01/2017  . ESRD on dialysis ([place]) [N18.6, Z99.2] 04/06/2017  . Complication of vascular access for dialysis [T82.9XXA] 04/06/2017  . Hematuria [R31.9] 12/18/2016  . Polycystic kidney [Q61.3] 10/12/2016  . Ruptured cyst of kidney [Q61.00] 10/03/2016  . HTN (hypertension) [I10] 10/01/2016  . Chronic kidney disease [N18.9] 10/01/2016  . Anemia associated with chronic renal failure [N18.9, D63.1] 09/24/2016  . Acute kidney insufficiency [N28.9] 07/28/2016  . Noninfectious diarrhea [K52.9]   . Benign neoplasm of cecum [D12.0]   . Benign neoplasm of ascending colon [D12.2]   . Diarrhea [R19.7]   . Nausea [R11.0]   . Gastric polyp [K31.7]   . Chronic constipation [K59.09] 12/27/2014  . Nausea with vomiting [R11.2] 12/27/2014  . Left sided abdominal pain [R10.9] 12/27/2014  . Personal history of colonic polyps [Z86.010] 05/10/2013   Total Time spent with patient: 20 minutes  Past Psychiatric History: Long history of bipolar disorder with recent worsening since starting dialysis  Past Medical History:  Past  Medical History:  Diagnosis Date  . Anal fissure   . Bipolar affective disorder ([place])   . CKD (chronic kidney disease)    Dr Holley Raring [name] 4  . Colon polyps   . Diverticulitis   . Diverticulitis   . Family history of adverse reaction to anesthesia    mom - PONV  . GERD (gastroesophageal reflux disease)   . Headache    migraines - none over 10 yrs  . Heart murmur   . History of hiatal hernia   . Hypertension   . Pancreatitis    Valproic acid  . Vertigo     Past Surgical History:  Procedure Laterality Date  . A/V FISTULAGRAM Left 04/14/2017   Procedure: A/V Fistulagram;  Surgeon: Katha Cabal, MD;  Location: [place] [facility];  Service: Cardiovascular;  Laterality: Left;  . A/V FISTULAGRAM Left 06/09/2017   Procedure: A/V FISTULAGRAM;  Surgeon: Katha Cabal, MD;  Location: [place] [facility];  Service: Cardiovascular;  Laterality: Left;  . ABDOMINAL HYSTERECTOMY  1990 ?  . AV FISTULA PLACEMENT  4/30  . BREAST EXCISIONAL BIOPSY Left 1994   neg surgical bx  . CHOLECYSTECTOMY  2003  . COLONOSCOPY  2014   Dr. Jamal Collin  . COLONOSCOPY WITH PROPOFOL N/A 09/24/2015   Procedure: COLONOSCOPY WITH random colon byopies.;  Surgeon: Lucilla Lame, MD;  Location: [place];  Service: Endoscopy;  Laterality: N/A;  . ESOPHAGOGASTRODUODENOSCOPY (EGD) WITH PROPOFOL N/A 09/24/2015   Procedure: ESOPHAGOGASTRODUODENOSCOPY (EGD) ;  Surgeon: Lucilla Lame, MD;  Location: [place];  Service: Endoscopy;  Laterality: N/A;  . EYE SURGERY    . PERIPHERAL VASCULAR CATHETERIZATION N/A 05/29/2015  Procedure: A/V Shuntogram/Fistulagram;  Surgeon: Katha Cabal, MD;  Location: [place] [facility];  Service: Cardiovascular;  Laterality: N/A;  . PERIPHERAL VASCULAR CATHETERIZATION N/A 05/29/2015   Procedure: A/V Shunt Intervention;  Surgeon: Katha Cabal, MD;  Location: [place] [facility];  Service: Cardiovascular;  Laterality: N/A;  . POLYPECTOMY  09/24/2015   Procedure:  POLYPECTOMY INTESTINAL;  Surgeon: Lucilla Lame, MD;  Location: [place];  Service: Endoscopy;;  cecal polyp ascending polyp   Family History:  Family History  Problem Relation Age of Onset  . Stroke Father   . Hypertension Father   . Breast cancer Other   . Colon cancer Neg Hx   . Liver disease Neg Hx    Family Psychiatric  History: Depression Social History:  Social History   Substance and Sexual Activity  Alcohol Use No  . Alcohol/week: 0.0 oz     Social History   Substance and Sexual Activity  Drug Use No    Social History   Socioeconomic History  . Marital status: Single    Spouse name: Not on file  . Number of children: 1  . Years of education: Not on file  . Highest education level: Not on file  Occupational History  . Occupation: Surveyor, quantity: [employer]  Social Needs  . Financial resource strain: Not on file  . Food insecurity:    Worry: Not on file    Inability: Not on file  . Transportation needs:    Medical: Not on file    Non-medical: Not on file  Tobacco Use  . Smoking status: Never Smoker  . Smokeless tobacco: Never Used  Substance and Sexual Activity  . Alcohol use: No    Alcohol/week: 0.0 oz  . Drug use: No  . Sexual activity: Not Currently  Lifestyle  . Physical activity:    Days per week: Not on file    Minutes per session: Not on file  . Stress: Not on file  Relationships  . Social connections:    Talks on phone: Not on file    Gets together: Not on file    Attends religious service: Not on file    Active member of club or organization: Not on file    Attends meetings of clubs or organizations: Not on file    Relationship status: Not on file  Other Topics Concern  . 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   Additional Social History:    History of alcohol / drug use?: No history of alcohol / drug abuse                     Sleep: Fair  Appetite:  Fair  Current Medications: Current Facility-Administered Medications  Medication Dose Route Frequency Provider Last Rate Last Dose  . 0.9 %  sodium chloride infusion   Intravenous Continuous Algernon Huxley, MD 20 mL/hr at 10/19/17 [phone]    . acetaminophen (TYLENOL) tablet 325 mg  325 mg Oral BID PRN Seferina Brokaw, [name], MD   325 mg at 10/20/17 0218  . alum & mag hydroxide-simeth (MAALOX/MYLANTA) 200-200-20 MG/5ML suspension 30 mL  30 mL Oral Q4H PRN Norie Latendresse, [name], MD   30 mL at 10/17/17 2117  . bisacodyl (DULCOLAX) EC tablet 5 mg  5 mg Oral Daily PRN Sarahy Creedon, [name], MD      .  brimonidine (ALPHAGAN) 0.2 % ophthalmic solution 1 drop  1 drop Both Eyes BID Donyea Gafford, [name], MD   1 drop at 10/19/17 0827  . ceFAZolin (ANCEF) IVPB 2g/100 mL premix  2 g Intravenous 30 min Pre-Op Algernon Huxley, MD      . cyanocobalamin ((VITAMIN B-12)) injection 1,000 mcg  1,000 mcg Intramuscular Q30 days Aniayah Alaniz, [name], MD   1,000 mcg at 10/17/17 1723  . dicyclomine (BENTYL) tablet 20 mg  20 mg Oral TID AC Trenton Passow T, MD   20 mg at 10/18/17 1731  . epoetin alfa (EPOGEN,PROCRIT) injection 4,000 Units  4,000 Units Intravenous Q M,W,F-HD [name], MD   4,000 Units at 10/15/17 1400  . famotidine (PEPCID) tablet 20 mg  20 mg Oral BID Baker Kogler T, MD      . feeding supplement (NEPRO CARB STEADY) liquid 237 mL  237 mL Oral BID BM Candiss Norse, [name], MD   237 mL at 10/18/17 1021  . fentaNYL (SUBLIMAZE) injection 25 mcg  25 mcg Intravenous Q5 min PRN [name], MD      . haloperidol (HALDOL) tablet 5 mg  5 mg Oral Q8H PRN [name], [name], MD   5 mg at 10/16/17 0242   Or  . haloperidol lactate (HALDOL) injection 5 mg  5 mg Intramuscular Q8H PRN [name], [name], MD   5 mg at 10/01/17 0908  . lidocaine (LIDODERM) 5 % 1 patch  1 patch Transdermal Q24H [name], [name], MD   1 patch at 10/19/17 1728  . lidocaine-prilocaine (EMLA) cream 1 application  1 application  Topical Q M,W,F Kalila Adkison, [name], MD   1 application at 28/31/51 1219  . LORazepam (ATIVAN) injection 2 mg  2 mg Intramuscular Q4H PRN Aviel Davalos, [name], MD   2 mg at 10/14/17 0134  . LORazepam (ATIVAN) injection 2 mg  2 mg Intramuscular Q6H PRN Thomas Rhude, [name], MD   2 mg at 10/15/17 1152  . LORazepam (ATIVAN) tablet 2 mg  2 mg Oral QHS [name], [name], MD   2 mg at 10/19/17 2118  . magnesium hydroxide (MILK OF MAGNESIA) suspension 30 mL  30 mL Oral Daily PRN Joachim Carton T, MD      . metoprolol tartrate (LOPRESSOR) tablet 12.5 mg  12.5 mg Oral BID [name], MD   12.5 mg at 10/19/17 1727  . midodrine (PROAMATINE) tablet 10 mg  10 mg Oral TID WC [name], MD   10 mg at 10/20/17 1218  . multivitamin (RENA-VIT) tablet 1 tablet  1 tablet Oral QHS [name], [name], MD   1 tablet at 10/19/17 2119  . OLANZapine zydis (ZYPREXA) disintegrating tablet 15 mg  15 mg Oral QHS [name], [name], MD   15 mg at 10/19/17 2119  . ondansetron (ZOFRAN) injection 4 mg  4 mg Intravenous Once PRN [name], MD      . pantoprazole (PROTONIX) EC tablet 40 mg  40 mg Oral Daily Fronie Holstein, [name], MD   40 mg at 10/18/17 0850  . pentafluoroprop-tetrafluoroeth (GEBAUERS) aerosol   Topical PRN [name], MD      . rOPINIRole (REQUIP) tablet 2 mg  2 mg Oral QHS [name], [name], MD   2 mg at 10/19/17 2120  . timolol (TIMOPTIC) 0.5 % ophthalmic solution 1 drop  1 drop Both Eyes BID Aaren Atallah, [name], MD   1 drop at 10/19/17 0828  . vitamin C (ASCORBIC ACID) tablet 500 mg  500 mg  Oral BID [name], [name], MD   500 mg at 10/18/17 1731    Lab Results:  Results for orders placed or performed during the hospital encounter of 09/17/17 (from the past 48 hour(s))  Glucose, capillary     Status: Abnormal   Collection Time: 10/19/17  7:00 AM  Result Value Ref Range   Glucose-Capillary 124 (H) 65 - 99 mg/dL  Glucose, capillary     Status: None   Collection Time: 10/19/17  7:03 AM  Result  Value Ref Range   Glucose-Capillary 93 65 - 99 mg/dL  Glucose, capillary     Status: Abnormal   Collection Time: 10/19/17 11:12 AM  Result Value Ref Range   Glucose-Capillary 109 (H) 65 - 99 mg/dL  CBC     Status: Abnormal   Collection Time: 10/20/17  2:39 PM  Result Value Ref Range   WBC 5.3 3.6 - 11.0 K/uL   RBC 2.82 (L) 3.80 - 5.20 MIL/uL   Hemoglobin 9.5 (L) 12.0 - 16.0 g/dL   HCT 26.8 (L) 35.0 - 47.0 %   MCV 95.2 80.0 - 100.0 fL   MCH 33.6 26.0 - 34.0 pg   MCHC 35.3 32.0 - 36.0 g/dL   RDW 14.9 (H) 11.5 - 14.5 %   Platelets 242 150 - 440 K/uL    Comment: Performed at [facility], [address] [place], [place] [address]  Renal function panel     Status: Abnormal   Collection Time: 10/20/17  2:39 PM  Result Value Ref Range   Sodium 139 135 - 145 mmol/L   Potassium 4.4 3.5 - 5.1 mmol/L   Chloride 101 101 - 111 mmol/L   CO2 26 22 - 32 mmol/L   Glucose, Bld 112 (H) 65 - 99 mg/dL   BUN 53 (H) 6 - 20 mg/dL   Creatinine, Ser 5.56 (H) 0.44 - 1.00 mg/dL   Calcium 9.3 8.9 - 10.3 mg/dL   Phosphorus 5.4 (H) 2.5 - 4.6 mg/dL   Albumin 3.3 (L) 3.5 - 5.0 g/dL   GFR calc non Af Amer 7 (L) >60 mL/min   GFR calc Af Amer 8 (L) >60 mL/min    Comment: (NOTE) The eGFR has been calculated using the CKD EPI equation. This calculation has not been validated in all clinical situations. eGFR's persistently <60 mL/min signify possible Chronic Kidney Disease.    Anion gap 12 5 - 15    Comment: Performed at [facility], [address] [place], [place] [address]    Blood Alcohol level:  Lab Results  Component Value Date   [employer] <10 18/84/1660    Metabolic Disorder Labs: Lab Results  Component Value Date   HGBA1C 4.8 09/18/2017   MPG 91.06 09/18/2017   No results found for: PROLACTIN Lab Results  Component Value Date   CHOL 251 (H) 09/18/2017   TRIG 124 09/18/2017   HDL 66 09/18/2017   CHOLHDL 3.8 09/18/2017   VLDL 25 09/18/2017   LDLCALC 160 (H) 09/18/2017    LDLCALC 43 05/14/2014    Physical Findings: AIMS: Facial and Oral Movements Muscles of Facial Expression: None, normal Lips and Perioral Area: None, normal Jaw: None, normal Tongue: None, normal,Extremity Movements Upper (arms, wrists, hands, fingers): None, normal Lower (legs, knees, ankles, toes): None, normal, Trunk Movements Neck, shoulders, hips: None, normal, Overall Severity Severity of abnormal movements (highest score from questions above): None, normal Incapacitation due to abnormal movements: None, normal Patient's awareness of abnormal movements (rate only patient's  report): No Awareness, Dental Status Current problems with teeth and/or dentures?: No Does patient usually wear dentures?: No  CIWA:    COWS:     Musculoskeletal: Strength & Muscle Tone: decreased Gait & Station: unsteady Patient leans: N/A  Psychiatric Specialty Exam: Physical Exam  Nursing note and vitals reviewed. Constitutional: She appears well-developed and well-nourished.  HENT:  Head: Normocephalic and atraumatic.  Eyes: Pupils are equal, round, and reactive to light. Conjunctivae are normal.  Neck: Normal range of motion.  Cardiovascular: Regular rhythm and normal heart sounds.  Respiratory: Effort normal. No respiratory distress.  GI: Soft.  Musculoskeletal: Normal range of motion.  Neurological: She is alert.  Skin: Skin is warm and dry.  Psychiatric: Judgment normal. Her affect is blunt. Her speech is delayed. She is slowed. She does not exhibit a depressed mood. She expresses no suicidal ideation. She exhibits abnormal recent memory.    Review of Systems  Psychiatric/Behavioral: Positive for memory loss. Negative for depression, hallucinations, substance abuse and suicidal ideas. The patient is nervous/anxious and has insomnia.     Blood pressure 99/60, pulse 91, temperature 98.1 F (36.7 C), temperature source Oral, resp. rate 19, height 5' 3.5" (1.613 m), weight 161 lb (73 kg),  SpO2 100 %.Body mass index is 28.07 kg/m.  General Appearance: Casual  Eye Contact:  Minimal  Speech:  Slow  Volume:  Decreased  Mood:  Dysphoric  Affect:  Constricted  Thought Process:  Coherent  Orientation:  Full (Time, Place, and Person)  Thought Content:  Rumination  Suicidal Thoughts:  No  Homicidal Thoughts:  No  Memory:  Immediate;   Fair Recent;   Poor Remote;   Fair  Judgement:  Impaired  Insight:  Shallow  Psychomotor Activity:  Decreased  Concentration:  Concentration: Fair  Recall:  [employer] of Knowledge:  Fair  Language:  Fair  Akathisia:  No  Handed:  Right  AIMS (if indicated):     Assets:  Desire for Improvement Housing Resilience Social Support  ADL's:  Impaired  Cognition:  Impaired,  Mild  Sleep:  Number of Hours: 5     Treatment Plan Summary: Daily contact with patient to assess and evaluate symptoms and progress in treatment, Medication management and Plan Patient continues to improve gradually although she still spends way too much time in bed and is withdrawn and sluggish and slow.  Not making an effort to take care of her own activities of daily living.  Unable to care for herself.  Having some cognitive impairment but definitely responding to ECT.  Coach the patient on what we need to see improving to feel comfortable about discharge.  It looks like she is probably going to have her graft repaired on Thursday.  Meanwhile next ECT tomorrow no change to medicine.  Alethia Berthold, MD 10/20/2017, 5:23 PM

## 2017-10-20 NOTE — Progress Notes (Signed)
Post HD Tx    10/20/17 1734  Vital Signs  Pulse Rate 80  Resp 17  BP 117/61  BP Location Right Arm  BP Method Automatic  Patient Position (if appropriate) Sitting  Oxygen Therapy  SpO2 100 %  O2 Device Room Air  Pulse Oximetry Type Continuous  Pain Assessment  Pain Scale 0-10  Pain Score 0  Dialysis Weight  Weight  (Unabl eto weigh pt in chair )  Post-Hemodialysis Assessment  Rinseback Volume (mL) 250 mL  Dialyzer Clearance Lightly streaked  Duration of HD Treatment -hour(s) 3 hour(s)  Hemodialysis Intake (mL) 500 mL  UF Total -Machine (mL) 1936 mL  Net UF (mL) 1436 mL  Tolerated HD Treatment Yes  Post-Hemodialysis Comments UF goal met   AVG/AVF Arterial Site Held (minutes) 15 minutes  AVG/AVF Venous Site Held (minutes) 5 minutes  Education / Care Plan  Dialysis Education Provided Yes  Documented Education in Care Plan Yes

## 2017-10-21 ENCOUNTER — Inpatient Hospital Stay: Payer: Medicare HMO | Admitting: Anesthesiology

## 2017-10-21 DIAGNOSIS — I120 Hypertensive chronic kidney disease with stage 5 chronic kidney disease or end stage renal disease: Secondary | ICD-10-CM | POA: Diagnosis not present

## 2017-10-21 DIAGNOSIS — N186 End stage renal disease: Secondary | ICD-10-CM | POA: Diagnosis not present

## 2017-10-21 DIAGNOSIS — F315 Bipolar disorder, current episode depressed, severe, with psychotic features: Secondary | ICD-10-CM | POA: Diagnosis not present

## 2017-10-21 DIAGNOSIS — K5792 Diverticulitis of intestine, part unspecified, without perforation or abscess without bleeding: Secondary | ICD-10-CM | POA: Diagnosis not present

## 2017-10-21 LAB — RENAL FUNCTION PANEL
Albumin: 3.5 g/dL (ref 3.5–5.0)
Anion gap: 12 (ref 5–15)
BUN: 28 mg/dL — AB (ref 6–20)
CHLORIDE: 98 mmol/L — AB (ref 101–111)
CO2: 30 mmol/L (ref 22–32)
Calcium: 9.3 mg/dL (ref 8.9–10.3)
Creatinine, Ser: 3.77 mg/dL — ABNORMAL HIGH (ref 0.44–1.00)
GFR calc Af Amer: 13 mL/min — ABNORMAL LOW (ref >60)
GFR, EST NON AFRICAN AMERICAN: 11 mL/min — AB (ref >60)
Glucose, Bld: 116 mg/dL — ABNORMAL HIGH (ref 65–99)
Phosphorus: 4.2 mg/dL (ref 2.5–4.6)
Potassium: 3.9 mmol/L (ref 3.5–5.1)
Sodium: 140 mmol/L (ref 135–145)

## 2017-10-21 LAB — CBC
HEMATOCRIT: 28.3 % — AB (ref 35.0–47.0)
Hemoglobin: 9.5 g/dL — ABNORMAL LOW (ref 12.0–16.0)
MCH: 32.3 pg (ref 26.0–34.0)
MCHC: 33.5 g/dL (ref 32.0–36.0)
MCV: 96.4 fL (ref 80.0–100.0)
Platelets: 210 10*3/uL (ref 150–440)
RBC: 2.93 MIL/uL — AB (ref 3.80–5.20)
RDW: 15 % — AB (ref 11.5–14.5)
WBC: 6.9 10*3/uL (ref 3.6–11.0)

## 2017-10-21 LAB — GLUCOSE, CAPILLARY
Glucose-Capillary: 103 mg/dL — ABNORMAL HIGH (ref 65–99)
Glucose-Capillary: 117 mg/dL — ABNORMAL HIGH (ref 65–99)

## 2017-10-21 MED ORDER — SUCCINYLCHOLINE CHLORIDE 20 MG/ML IJ SOLN
INTRAMUSCULAR | Status: AC
  Filled 2017-10-21: qty 1

## 2017-10-21 MED ORDER — CARBAMAZEPINE 200 MG PO TABS
200.0000 mg | ORAL_TABLET | Freq: Two times a day (BID) | ORAL | Status: DC
  Administered 2017-10-21 – 2017-10-22 (×2): 200 mg via ORAL
  Filled 2017-10-21 (×2): qty 1

## 2017-10-21 MED ORDER — LABETALOL HCL 5 MG/ML IV SOLN
INTRAVENOUS | Status: DC | PRN
  Administered 2017-10-21: 10 mg via INTRAVENOUS

## 2017-10-21 MED ORDER — SUCCINYLCHOLINE CHLORIDE 200 MG/10ML IV SOSY
PREFILLED_SYRINGE | INTRAVENOUS | Status: DC | PRN
  Administered 2017-10-21: 80 mg via INTRAVENOUS

## 2017-10-21 MED ORDER — METHOHEXITAL SODIUM 0.5 G IJ SOLR
INTRAMUSCULAR | Status: AC
  Filled 2017-10-21: qty 500

## 2017-10-21 MED ORDER — METHOHEXITAL SODIUM 100 MG/10ML IV SOSY
PREFILLED_SYRINGE | INTRAVENOUS | Status: DC | PRN
  Administered 2017-10-21: 70 mg via INTRAVENOUS

## 2017-10-21 MED ORDER — SODIUM CHLORIDE 0.9 % IV SOLN: 500.0000 mL | Freq: Once | INTRAVENOUS | Status: DC

## 2017-10-21 MED ORDER — TEMAZEPAM 15 MG PO CAPS
15.0000 mg | ORAL_CAPSULE | Freq: Once | ORAL | Status: AC
  Administered 2017-10-21: 15 mg via ORAL
  Filled 2017-10-21: qty 1

## 2017-10-21 NOTE — [facility] Group Notes (Signed)
LCSW Group Therapy Note  10/21/2017 1:00 pm  Type of Therapy/Topic:  Group Therapy:  Emotion Regulation  Participation Level:  Did Not Attend   Description of Group:    The purpose of this group is to assist patients in learning to regulate negative emotions and experience positive emotions. Patients will be guided to discuss ways in which they have been vulnerable to their negative emotions. These vulnerabilities will be juxtaposed with experiences of positive emotions or situations, and patients will be challenged to use positive emotions to combat negative ones. Special emphasis will be placed on coping with negative emotions in conflict situations, and patients will process healthy conflict resolution skills.  Therapeutic Goals: 1. Patient will identify two positive emotions or experiences to reflect on in order to balance out negative emotions 2. Patient will label two or more emotions that they find the most difficult to experience 3. Patient will demonstrate positive conflict resolution skills through discussion and/or role plays  Summary of Patient Progress:  Joyce [name] was invited to today's group, but chose not to attend.     Therapeutic Modalities:   Cognitive Behavioral Therapy Feelings Identification Dialectical Behavioral Therapy

## 2017-10-21 NOTE — Plan of Care (Addendum)
Patient found in bed awake upon my arrival. Patient is somewhat more visible tonight, her family visited, but remains disengaged from peers. Patient is pleasant and cooperative with assessment. Reports feeling better. Improved mood and affect. Educated patient regarding NPO status after midnight for ECT tomorrow. Patient verbalized understanding and asked what time she would be able to eat and drink tomorrow but Probation officer was unable to answer this question. Denies pain. Denies SI/HI/AVH. Stated mood is "fine." Remains isolative and minimally verbal but improved from yesterday. Observed washing her laundry after her family left. Much more time out of bed than last night as well. BP WNL. No complaints of vertigo or lightheadedness reported. Compliant with HS medications and staff direction. Q 15 minute checks maintained. Will continue to monitor throughout the shift. @0000 , all drinks removed from room.  Patient slept 4.75 hours. No apparent distress. NPO maintained. CBG 103. Will endorse care to oncoming shift.  Problem: Pain Managment: Goal: General experience of comfort will improve Outcome: Progressing   Problem: Education: Goal: Will be free of psychotic symptoms Outcome: Progressing Goal: Knowledge of the prescribed therapeutic regimen will improve Outcome: Progressing   Problem: Coping: Goal: Ability to cope will improve Outcome: Progressing Goal: Ability to verbalize feelings will improve Outcome: Progressing   Problem: Health Behavior/Discharge Planning: Goal: Compliance with prescribed medication regimen will improve Outcome: Progressing   Problem: Nutritional: Goal: Ability to achieve adequate nutritional intake will improve Outcome: Progressing   Problem: Safety: Goal: Ability to remain free from injury will improve Outcome: Progressing   Problem: Self-Concept: Goal: Ability to verbalize positive feelings about self will improve Outcome: Progressing   Problem:  Activity: Goal: Ability to tolerate increased activity will improve Outcome: Progressing

## 2017-10-21 NOTE — Progress Notes (Signed)
Recreation Therapy Notes   Date: 10/21/2017  Time: 9:30 am  Location: Craft Room  Behavioral response: N/A  Intervention Topic: Coping Skills  Discussion/Intervention: Patient did not attend group.   Clinical Observations/Feedback:  Patient did not attend group. Vicktoria Muckey LRT/CTRS            Spyros Winch 10/21/2017 12:39 PM

## 2017-10-21 NOTE — Procedures (Signed)
ECT SERVICES Physician's Interval Evaluation & Treatment Note  Patient Identification: Joyce [name] MRN:  [id number] Date of Evaluation:  10/21/2017 TX #: [id number]  MADRS:   MMSE:   P.E. Findings:  Physical exam stable heart and lungs normal.  Strength much better.  Vitals unremarkable  Psychiatric Interval Note:  Awake alert not reporting depression not showing signs of psychosis  Subjective:  Patient is a 72 y.o. female seen for evaluation for Electroconvulsive Therapy. Feeling significant better just a little frustrated with timing of getting her stent re-fixed  Treatment Summary:   []   Right Unilateral             [x]  Bilateral   % Energy : 1.0 ms 50%   Impedance: 1890 ohms  Seizure Energy Index: 10,675 V squared  Postictal Suppression Index: 86%  Seizure Concordance Index: 95%  Medications  Pre Shock: Brevital 70 mg succinylcholine 80 mg  Post Shock:    Seizure Duration: 42 seconds by EMG 55 seconds by EEG   Comments: This is probably the last index treatment and we will try having her come back next week if we can discharge her by the end of this week  Lungs:  [x]   Clear to auscultation               []  Other:   Heart:    [x]   Regular rhythm             []  irregular rhythm    [x]   Previous H&P reviewed, patient examined and there are NO CHANGES                 []   Previous H&P reviewed, patient examined and there are changes noted.   Alethia Berthold, MD 4/3/201911:30 AM

## 2017-10-21 NOTE — Plan of Care (Addendum)
Patient found in hallway upon my arrival. Patient is visible and social with staff but not peers. Patient displays pronounced short term memory deficit. Requests something to eat stating, "I'm supposed to go home tomorrow. My family left me here for overnight but they didn't give me any change for the vending machine or any money but I am so hungry. I can't remember the last time I ate." Patient given sandwich tray and beverage of which she ate 100%. Reoriented patient to her length of stay, ECT, dialysis, and discharge plans. States, "Oh my Joyce [name]! I thought I just came today. I had no idea how long I was here." Patient's long term memory is intact and she is not resistant to reorientation. Denies SI/HI/AVH. Denies depression or anxiety. Patient is talkative and pleasant. Complains of chest wall muscle pain, given Tylenol. Will monitor for efficacy. Compliant with HS medications and staff direction. Q 15 minute checks maintained. Will continue to monitor throughout the shift. @2300 , patient has been increasingly agitated and restless throughout the evening. HS medication and Tylenol are ineffective. Dr. Weber Cooks contacted. Order received for Restoril 15mg  PO x 1 now. Will monitor for efficacy. Patient remains awake at 0100, pushing call light multiple times. Requires constant reorientation. Patient asks for nurse and then asks the same questions repeatedly. "Why am I here? Do you think the doctor gave me too much medicine? I need to call my family to get money for food, when can I do that?" Patient is not as restless after Restoril but continues to call RN to room throughout the night. Sometimes falling asleep in the time it takes to walk to her room. Reassured patient that memory deficit would not be permanent and is common with ECT. Patient accepts reorientation and reassurance with grace but forgets shortly thereafter. Will continue to monitor.  Patient slept restlessly 4.5 hours. Patient pulse recorded as  elevated in the 130s. Manual radial pulse is 100 which is her baseline. Will endorse care to oncoming shift.  Problem: Pain Managment: Goal: General experience of comfort will improve Outcome: Progressing   Problem: Education: Goal: Will be free of psychotic symptoms Outcome: Progressing Goal: Knowledge of the prescribed therapeutic regimen will improve Outcome: Progressing   Problem: Coping: Goal: Ability to cope will improve Outcome: Progressing Goal: Ability to verbalize feelings will improve Outcome: Progressing   Problem: Health Behavior/Discharge Planning: Goal: Compliance with prescribed medication regimen will improve Outcome: Progressing   Problem: Nutritional: Goal: Ability to achieve adequate nutritional intake will improve Outcome: Progressing   Problem: Safety: Goal: Ability to remain free from injury will improve Outcome: Progressing   Problem: Activity: Goal: Sleeping patterns will improve Outcome: Progressing

## 2017-10-21 NOTE — Transfer of Care (Signed)
Immediate Anesthesia Transfer of Care Note  Patient: Joyce [name]  Procedure(s) Performed: ECT TX  Patient Location: PACU  Anesthesia Type:General  Level of Consciousness: sedated  Airway & Oxygen Therapy: Patient Spontanous Breathing and Patient connected to face mask oxygen  Post-op Assessment: Report given to RN and Post -op Vital signs reviewed and stable  Post vital signs: Reviewed and stable  Last Vitals:  Vitals Value Taken Time  BP 151/80 10/21/2017 11:49 AM  Temp    Pulse 120 10/21/2017 11:50 AM  Resp 19 10/21/2017 11:50 AM  SpO2 99 % 10/21/2017 11:50 AM  Vitals shown include unvalidated device data.  Last Pain:  Vitals:   10/21/17 0926  TempSrc: Oral  PainSc:       Patients Stated Pain Goal: 0 (58/68/25 7493)  Complications: No apparent anesthesia complications

## 2017-10-21 NOTE — Anesthesia Postprocedure Evaluation (Signed)
Anesthesia Post Note  Patient: Joyce [name]  Procedure(s) Performed: ECT TX  Patient location during evaluation: PACU Anesthesia Type: General Level of consciousness: awake and alert Pain management: pain level controlled Vital Signs Assessment: post-procedure vital signs reviewed and stable Respiratory status: spontaneous breathing, nonlabored ventilation and respiratory function stable Cardiovascular status: blood pressure returned to baseline and stable Postop Assessment: no signs of nausea or vomiting Anesthetic complications: no     Last Vitals:  Vitals:   10/21/17 1209 10/21/17 1219  BP:  (!) 136/57  Pulse: (!) 124 96  Resp: 17 13  Temp:  (!) 36.4 C  SpO2: 97% 98%    Last Pain:  Vitals:   10/21/17 0926  TempSrc: Oral  PainSc:                  Missey Hasley

## 2017-10-21 NOTE — Plan of Care (Signed)
Patient is alert and denies SI, HI and AVH. Patient does have mild confusion. ECT procedure preformed today. Patient was confused about why she was admitted in the hospital. Patient continues to be compliant with medications. After ECT patient was able to answer questions about her name; date of birth but needed reminding of which hospital she was in presently. Nurse will continue to monitor. Safety checks will continue Q 15 minutes.

## 2017-10-21 NOTE — BHH Counselor (Signed)
CSW spoke with the patient regarding discharge plans. The patient states that she will go and stay with her son at discharge.   CSW spoke with the patients son and daughter-in-law in person regarding the patients discharge plans. The son and daughter-in-law confirms that the patient can come and live with them. They will be coming to pick up the patient for discharge tomorrow at [place], MSW, [place], [place] 10/21/2017 2:14 PM

## 2017-10-21 NOTE — Anesthesia Preprocedure Evaluation (Signed)
Anesthesia Evaluation  Patient identified by MRN, date of birth, ID band Patient awake    Reviewed: Allergy & Precautions, NPO status , Patient's Chart, lab work & pertinent test results  History of Anesthesia Complications Negative for: history of anesthetic complications  Airway Mallampati: II  TM Distance: >3 FB Neck ROM: Full    Dental no notable dental hx.    Pulmonary neg pulmonary ROS, neg sleep apnea, neg COPD,    breath sounds clear to auscultation- rhonchi (-) wheezing      Cardiovascular hypertension, Pt. on medications (-) CAD, (-) Past MI, (-) Cardiac Stents and (-) CABG  Rhythm:Regular Rate:Normal - Systolic murmurs and - Diastolic murmurs    Neuro/Psych  Headaches, PSYCHIATRIC DISORDERS Bipolar Disorder    GI/Hepatic Neg liver ROS, hiatal hernia, GERD  ,  Endo/Other  negative endocrine ROSneg diabetes  Renal/GU ESRF and DialysisRenal disease (last dialysis 10/20/17 )     Musculoskeletal negative musculoskeletal ROS (+)   Abdominal (+) - obese,   Peds  Hematology  (+) anemia ,   Anesthesia Other Findings Past Medical History: No date: Anal fissure No date: Bipolar affective disorder ([place]) No date: CKD (chronic kidney disease)     Comment:  Dr Holley Raring [name] 4 No date: Colon polyps No date: Diverticulitis No date: Diverticulitis No date: Family history of adverse reaction to anesthesia     Comment:  mom - PONV No date: GERD (gastroesophageal reflux disease) No date: Headache     Comment:  migraines - none over 10 yrs No date: Heart murmur No date: History of hiatal hernia No date: Hypertension No date: Pancreatitis     Comment:  Valproic acid No date: Vertigo   Reproductive/Obstetrics                             Anesthesia Physical  Anesthesia Plan  ASA: IV  Anesthesia Plan: General   Post-op Pain Management:    Induction: Intravenous  PONV Risk Score and  Plan: 2 and Ondansetron  Airway Management Planned: Mask  Additional Equipment:   Intra-op Plan:   Post-operative Plan:   Informed Consent: I have reviewed the patients History and Physical, chart, labs and discussed the procedure including the risks, benefits and alternatives for the proposed anesthesia with the patient or authorized representative who has indicated his/her understanding and acceptance.   Dental advisory given  Plan Discussed with: CRNA and Anesthesiologist  Anesthesia Plan Comments:         Anesthesia Quick Evaluation

## 2017-10-21 NOTE — H&P (Signed)
Joyce [name] is an 72 y.o. female.   Chief Complaint: Patient just feeling a little frustrated about the timing of things.  Not reporting feeling severely depressed not reporting psychotic symptoms HPI: History of long-standing bipolar disorder  Past Medical History:  Diagnosis Date  . Anal fissure   . Bipolar affective disorder ([place])   . CKD (chronic kidney disease)    Dr Holley Raring [name] 4  . Colon polyps   . Diverticulitis   . Diverticulitis   . Family history of adverse reaction to anesthesia    mom - PONV  . GERD (gastroesophageal reflux disease)   . Headache    migraines - none over 10 yrs  . Heart murmur   . History of hiatal hernia   . Hypertension   . Pancreatitis    Valproic acid  . Vertigo     Past Surgical History:  Procedure Laterality Date  . A/V FISTULAGRAM Left 04/14/2017   Procedure: A/V Fistulagram;  Surgeon: Katha Cabal, MD;  Location: [facility];  Service: Cardiovascular;  Laterality: Left;  . A/V FISTULAGRAM Left 06/09/2017   Procedure: A/V FISTULAGRAM;  Surgeon: Katha Cabal, MD;  Location: [facility];  Service: Cardiovascular;  Laterality: Left;  . ABDOMINAL HYSTERECTOMY  1990 ?  . AV FISTULA PLACEMENT  4/30  . BREAST EXCISIONAL BIOPSY Left 1994   neg surgical bx  . CHOLECYSTECTOMY  2003  . COLONOSCOPY  2014   Dr. Jamal Collin  . COLONOSCOPY WITH PROPOFOL N/A 09/24/2015   Procedure: COLONOSCOPY WITH random colon byopies.;  Surgeon: Lucilla Lame, MD;  Location: [facility];  Service: Endoscopy;  Laterality: N/A;  . ESOPHAGOGASTRODUODENOSCOPY (EGD) WITH PROPOFOL N/A 09/24/2015   Procedure: ESOPHAGOGASTRODUODENOSCOPY (EGD) ;  Surgeon: Lucilla Lame, MD;  Location: [facility];  Service: Endoscopy;  Laterality: N/A;  . EYE SURGERY    . PERIPHERAL VASCULAR CATHETERIZATION N/A 05/29/2015   Procedure: A/V Shuntogram/Fistulagram;  Surgeon: Katha Cabal, MD;  Location: [facility];  Service: Cardiovascular;   Laterality: N/A;  . PERIPHERAL VASCULAR CATHETERIZATION N/A 05/29/2015   Procedure: A/V Shunt Intervention;  Surgeon: Katha Cabal, MD;  Location: [facility];  Service: Cardiovascular;  Laterality: N/A;  . POLYPECTOMY  09/24/2015   Procedure: POLYPECTOMY INTESTINAL;  Surgeon: Lucilla Lame, MD;  Location: [facility];  Service: Endoscopy;;  cecal polyp ascending polyp    Family History  Problem Relation Age of Onset  . Stroke Father   . Hypertension Father   . Breast cancer Other   . Colon cancer Neg Hx   . Liver disease Neg Hx    Social History:  reports that she has never smoked. She has never used smokeless tobacco. She reports that she does not drink alcohol or use drugs.  Allergies:  Allergies  Allergen Reactions  . Morphine And Related Shortness Of Breath    Pt reports chest pain and difficulty breathing.  . Indomethacin Hives  . Pollen Extract Other (See Comments)    Sinus problems and HA    Medications Prior to Admission  Medication Sig Dispense Refill  . acetaminophen (TYLENOL) 325 MG tablet Take 325 mg 2 (two) times daily as needed by mouth for moderate pain or headache.     . brimonidine (ALPHAGAN) 0.2 % ophthalmic solution Place 2 (two) times daily into both eyes.    . carbamazepine (TEGRETOL) 200 MG tablet Take 200 mg by mouth 2 (two) times daily.    . cephALEXin (  KEFLEX) 500 MG capsule Take 1 capsule (500 mg total) by mouth every 12 (twelve) hours. 5 capsule 0  . cholecalciferol (VITAMIN D) 1000 UNITS tablet Take 1,000 Units by mouth daily.    . cyanocobalamin (,VITAMIN B-12,) 1000 MCG/ML injection Inject 1,000 mcg into the muscle every 30 (thirty) days.    Marland Kitchen dicyclomine (BENTYL) 20 MG tablet Take 1 tablet (20 mg total) by mouth 3 (three) times daily before meals. 30 tablet 0  . lidocaine-prilocaine (EMLA) cream Apply 1 application every Monday, Wednesday, and Friday topically. At dialysis  3  . midodrine (PROAMATINE) 10 MG tablet Take 10 mg by  mouth daily.     . pantoprazole (PROTONIX) 40 MG tablet TAKE 1 TABLET EVERY DAY 90 tablet 3  . pramipexole (MIRAPEX) 0.125 MG tablet Take 0.125 mg by mouth daily at 2 PM.  3  . timolol (TIMOPTIC) 0.5 % ophthalmic solution Place 1 drop 2 (two) times daily into both eyes.     . ARIPiprazole (ABILIFY) 2 MG tablet Take 2 mg by mouth daily.    . bisacodyl (DULCOLAX) 5 MG EC tablet Take 1 tablet (5 mg total) by mouth daily as needed for moderate constipation. 30 tablet 0  . QUEtiapine (SEROQUEL) 25 MG tablet Take 50 mg by mouth at bedtime.       Results for orders placed or performed during the hospital encounter of 09/17/17 (from the past 48 hour(s))  Glucose, capillary     Status: Abnormal   Collection Time: 10/19/17 11:12 AM  Result Value Ref Range   Glucose-Capillary 109 (H) 65 - 99 mg/dL  CBC     Status: Abnormal   Collection Time: 10/20/17  2:39 PM  Result Value Ref Range   WBC 5.3 3.6 - 11.0 K/uL   RBC 2.82 (L) 3.80 - 5.20 MIL/uL   Hemoglobin 9.5 (L) 12.0 - 16.0 g/dL   HCT 26.8 (L) 35.0 - 47.0 %   MCV 95.2 80.0 - 100.0 fL   MCH 33.6 26.0 - 34.0 pg   MCHC 35.3 32.0 - 36.0 g/dL   RDW 14.9 (H) 11.5 - 14.5 %   Platelets 242 150 - 440 K/uL    Comment: Performed at [facility], [address] [place], [place] [address]  Renal function panel     Status: Abnormal   Collection Time: 10/20/17  2:39 PM  Result Value Ref Range   Sodium 139 135 - 145 mmol/L   Potassium 4.4 3.5 - 5.1 mmol/L   Chloride 101 101 - 111 mmol/L   CO2 26 22 - 32 mmol/L   Glucose, Bld 112 (H) 65 - 99 mg/dL   BUN 53 (H) 6 - 20 mg/dL   Creatinine, Ser 5.56 (H) 0.44 - 1.00 mg/dL   Calcium 9.3 8.9 - 10.3 mg/dL   Phosphorus 5.4 (H) 2.5 - 4.6 mg/dL   Albumin 3.3 (L) 3.5 - 5.0 g/dL   GFR calc non Af Amer 7 (L) >60 mL/min   GFR calc Af Amer 8 (L) >60 mL/min    Comment: (NOTE) The eGFR has been calculated using the CKD EPI equation. This calculation has not been validated in all clinical situations. eGFR's  persistently <60 mL/min signify possible Chronic Kidney Disease.    Anion gap 12 5 - 15    Comment: Performed at [facility], [address] [place], [place] [address]  Renal function panel     Status: Abnormal   Collection Time: 10/21/17 12:06 AM  Result Value Ref  Range   Sodium 140 135 - 145 mmol/L   Potassium 3.9 3.5 - 5.1 mmol/L   Chloride 98 (L) 101 - 111 mmol/L   CO2 30 22 - 32 mmol/L   Glucose, Bld 116 (H) 65 - 99 mg/dL   BUN 28 (H) 6 - 20 mg/dL   Creatinine, Ser 3.77 (H) 0.44 - 1.00 mg/dL   Calcium 9.3 8.9 - 10.3 mg/dL   Phosphorus 4.2 2.5 - 4.6 mg/dL   Albumin 3.5 3.5 - 5.0 g/dL   GFR calc non Af Amer 11 (L) >60 mL/min   GFR calc Af Amer 13 (L) >60 mL/min    Comment: (NOTE) The eGFR has been calculated using the CKD EPI equation. This calculation has not been validated in all clinical situations. eGFR's persistently <60 mL/min signify possible Chronic Kidney Disease.    Anion gap 12 5 - 15    Comment: Performed at [facility], [address] [place], [place] [address]  CBC     Status: Abnormal   Collection Time: 10/21/17 12:06 AM  Result Value Ref Range   WBC 6.9 3.6 - 11.0 K/uL   RBC 2.93 (L) 3.80 - 5.20 MIL/uL   Hemoglobin 9.5 (L) 12.0 - 16.0 g/dL   HCT 28.3 (L) 35.0 - 47.0 %   MCV 96.4 80.0 - 100.0 fL   MCH 32.3 26.0 - 34.0 pg   MCHC 33.5 32.0 - 36.0 g/dL   RDW 15.0 (H) 11.5 - 14.5 %   Platelets 210 150 - 440 K/uL    Comment: Performed at [facility], [address] [place], [place] [address]  Glucose, capillary     Status: Abnormal   Collection Time: 10/21/17  6:24 AM  Result Value Ref Range   Glucose-Capillary 103 (H) 65 - 99 mg/dL   No results found.  Review of Systems  Constitutional: Negative.   HENT: Negative.   Eyes: Negative.   Respiratory: Negative.   Cardiovascular: Negative.   Gastrointestinal: Negative.   Musculoskeletal: Negative.   Skin: Negative.   Neurological: Negative.   Psychiatric/Behavioral:  Positive for memory loss. Negative for depression, hallucinations, substance abuse and suicidal ideas. The patient is not nervous/anxious and does not have insomnia.     Blood pressure 103/65, pulse 85, temperature 97.9 F (36.6 C), temperature source Oral, resp. rate 18, height '5\' 3"'$  (1.6 m), weight 158 lb (71.7 kg), SpO2 99 %. Physical Exam  Nursing note and vitals reviewed. Constitutional: She appears well-developed and well-nourished.  HENT:  Head: Normocephalic and atraumatic.  Eyes: Pupils are equal, round, and reactive to light. Conjunctivae are normal.  Neck: Normal range of motion.  Cardiovascular: Regular rhythm and normal heart sounds.  Respiratory: Effort normal. No respiratory distress.  GI: Soft.  Musculoskeletal: Normal range of motion.  Neurological: She is alert.  Skin: Skin is warm and dry.  Psychiatric: She has a normal mood and affect. Judgment and thought content normal. Her speech is delayed. She is slowed. Cognition and memory are impaired. She exhibits abnormal recent memory.     Assessment/Plan Treatment today and possible discharge prior to the end of the week but with anticipated outpatient treatment thereafter  Alethia Berthold, MD 10/21/2017, 11:07 AM

## 2017-10-21 NOTE — Anesthesia Post-op Follow-up Note (Signed)
Anesthesia QCDR form completed.        

## 2017-10-21 NOTE — Progress Notes (Signed)
[facility] MD Progress Note  10/21/2017 3:04 PM Joyce [name]  MRN:  [id number] Subjective: Follow-up for this 72 year old woman with bipolar disorder who has been through a year confused depression and is being treated with ECT.  Patient had bilateral ECT this morning which was accomplished without complication.  During the recovery period she had a short time of having moderate tachycardia around 120 bpm of sinus rhythm with no symptoms.  She has resolved this however.  As far as psychiatric symptoms the patient is significantly improved.  Prior to treatment today she was upbeat able to make positive conversation with staff.  Appeared to be very lucid was cooperative no indication of any suicidality.  This afternoon after treatment she is still a little bit sluggish.  Short-term memory is clearly showing some impairment.  She was somewhat surprised when I told her how long she had been in the hospital.  Plan at this point is for discharge tomorrow home. Principal Problem: Bipolar affective disorder, current episode manic with psychotic symptoms ([place]) Diagnosis:   Patient Active Problem List   Diagnosis Date Noted  . Palliative care encounter [Z51.5]   . Bipolar affective disorder, current episode depressed with psychotic symptoms ([place]) [F31.2] 09/17/2017  . Bipolar I disorder, most recent episode (or current) manic ([place]) [F31.10] 09/02/2017  . Acute delirium [R41.0] 09/02/2017  . Altered mental status [R41.82]   . Acute encephalopathy [G93.40] 09/01/2017  . ESRD on dialysis ([place]) [N18.6, Z99.2] 04/06/2017  . Complication of vascular access for dialysis [T82.9XXA] 04/06/2017  . Hematuria [R31.9] 12/18/2016  . Polycystic kidney [Q61.3] 10/12/2016  . Ruptured cyst of kidney [Q61.00] 10/03/2016  . HTN (hypertension) [I10] 10/01/2016  . Chronic kidney disease [N18.9] 10/01/2016  . Anemia associated with chronic renal failure [N18.9, D63.1] 09/24/2016  . Acute kidney insufficiency [N28.9] 07/28/2016   . Noninfectious diarrhea [K52.9]   . Benign neoplasm of cecum [D12.0]   . Benign neoplasm of ascending colon [D12.2]   . Diarrhea [R19.7]   . Nausea [R11.0]   . Gastric polyp [K31.7]   . Chronic constipation [K59.09] 12/27/2014  . Nausea with vomiting [R11.2] 12/27/2014  . Left sided abdominal pain [R10.9] 12/27/2014  . Personal history of colonic polyps [Z86.010] 05/10/2013   Total Time spent with patient: 30 minutes  Past Psychiatric History: Long-standing bipolar disorder multiple episodes of mania and depression.  First episode of ECT as far as I know.  Past Medical History:  Past Medical History:  Diagnosis Date  . Anal fissure   . Bipolar affective disorder ([place])   . CKD (chronic kidney disease)    Dr Holley Raring [name] 4  . Colon polyps   . Diverticulitis   . Diverticulitis   . Family history of adverse reaction to anesthesia    mom - PONV  . GERD (gastroesophageal reflux disease)   . Headache    migraines - none over 10 yrs  . Heart murmur   . History of hiatal hernia   . Hypertension   . Pancreatitis    Valproic acid  . Vertigo     Past Surgical History:  Procedure Laterality Date  . A/V FISTULAGRAM Left 04/14/2017   Procedure: A/V Fistulagram;  Surgeon: Katha Cabal, MD;  Location: [place] [facility];  Service: Cardiovascular;  Laterality: Left;  . A/V FISTULAGRAM Left 06/09/2017   Procedure: A/V FISTULAGRAM;  Surgeon: Katha Cabal, MD;  Location: [place] [facility];  Service: Cardiovascular;  Laterality: Left;  . ABDOMINAL HYSTERECTOMY  1990 ?  Marland Kitchen  AV FISTULA PLACEMENT  4/30  . BREAST EXCISIONAL BIOPSY Left 1994   neg surgical bx  . CHOLECYSTECTOMY  2003  . COLONOSCOPY  2014   Dr. Jamal Collin  . COLONOSCOPY WITH PROPOFOL N/A 09/24/2015   Procedure: COLONOSCOPY WITH random colon byopies.;  Surgeon: Lucilla Lame, MD;  Location: [place];  Service: Endoscopy;  Laterality: N/A;  . ESOPHAGOGASTRODUODENOSCOPY (EGD) WITH PROPOFOL N/A 09/24/2015    Procedure: ESOPHAGOGASTRODUODENOSCOPY (EGD) ;  Surgeon: Lucilla Lame, MD;  Location: [place];  Service: Endoscopy;  Laterality: N/A;  . EYE SURGERY    . PERIPHERAL VASCULAR CATHETERIZATION N/A 05/29/2015   Procedure: A/V Shuntogram/Fistulagram;  Surgeon: Katha Cabal, MD;  Location: [place] [facility];  Service: Cardiovascular;  Laterality: N/A;  . PERIPHERAL VASCULAR CATHETERIZATION N/A 05/29/2015   Procedure: A/V Shunt Intervention;  Surgeon: Katha Cabal, MD;  Location: [place] [facility];  Service: Cardiovascular;  Laterality: N/A;  . POLYPECTOMY  09/24/2015   Procedure: POLYPECTOMY INTESTINAL;  Surgeon: Lucilla Lame, MD;  Location: [place];  Service: Endoscopy;;  cecal polyp ascending polyp   Family History:  Family History  Problem Relation Age of Onset  . Stroke Father   . Hypertension Father   . Breast cancer Other   . Colon cancer Neg Hx   . Liver disease Neg Hx    Family Psychiatric  History: Positive for mood disorder Social History:  Social History   Substance and Sexual Activity  Alcohol Use No  . Alcohol/week: 0.0 oz     Social History   Substance and Sexual Activity  Drug Use No    Social History   Socioeconomic History  . Marital status: Single    Spouse name: Not on file  . Number of children: 1  . Years of education: Not on file  . Highest education level: Not on file  Occupational History  . Occupation: Surveyor, quantity: [employer]  Social Needs  . Financial resource strain: Not on file  . Food insecurity:    Worry: Not on file    Inability: Not on file  . Transportation needs:    Medical: Not on file    Non-medical: Not on file  Tobacco Use  . Smoking status: Never Smoker  . Smokeless tobacco: Never Used  Substance and Sexual Activity  . Alcohol use: No    Alcohol/week: 0.0 oz  . Drug use: No  . Sexual activity: Not Currently  Lifestyle  . Physical activity:    Days per week: Not on file    Minutes per  session: Not on file  . Stress: Not on file  Relationships  . Social connections:    Talks on phone: Not on file    Gets together: Not on file    Attends religious service: Not on file    Active member of club or organization: Not on file    Attends meetings of clubs or organizations: Not on file    Relationship status: Not on file  Other Topics Concern  . 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   Additional Social History:    History of alcohol / drug use?: No history of alcohol / drug abuse                    Sleep: Fair  Appetite:  Fair  Current Medications: Current Facility-Administered Medications  Medication Dose Route Frequency Provider Last Rate Last Dose  . 0.9 %  sodium chloride infusion   Intravenous Continuous Algernon Huxley, MD 20 mL/hr at 10/19/17 [id number]    . 0.9 %  sodium chloride infusion  500 mL Intravenous Once Essam Lowdermilk T, MD      . acetaminophen (TYLENOL) tablet 325 mg  325 mg Oral BID PRN Ivori Storr, [name], MD   325 mg at 10/20/17 0218  . alum & mag hydroxide-simeth (MAALOX/MYLANTA) 200-200-20 MG/5ML suspension 30 mL  30 mL Oral Q4H PRN Arriyanna Mersch, [name], MD   30 mL at 10/17/17 2117  . bisacodyl (DULCOLAX) EC tablet 5 mg  5 mg Oral Daily PRN Keanan Melander T, MD      . brimonidine (ALPHAGAN) 0.2 % ophthalmic solution 1 drop  1 drop Both Eyes BID Jaxn Chiquito, [name], MD   1 drop at 10/20/17 1814  . carbamazepine (TEGRETOL) tablet 200 mg  200 mg Oral BID Boy Delamater T, MD      . ceFAZolin (ANCEF) IVPB 2g/100 mL premix  2 g Intravenous 30 min Pre-Op Algernon Huxley, MD      . cyanocobalamin ((VITAMIN B-12)) injection 1,000 mcg  1,000 mcg Intramuscular Q30 days Guinn Delarosa, [name], MD   1,000 mcg at 10/17/17 1723  . dicyclomine (BENTYL) tablet 20 mg  20 mg Oral TID AC Leverne Amrhein T, MD   20 mg at 10/20/17 1814  . epoetin alfa (EPOGEN,PROCRIT) injection 4,000 Units  4,000 Units  Intravenous Q M,W,F-HD [name], MD   4,000 Units at 10/15/17 1400  . famotidine (PEPCID) tablet 20 mg  20 mg Oral BID Klint Lezcano T, MD      . feeding supplement (NEPRO CARB STEADY) liquid 237 mL  237 mL Oral BID BM [name], MD   237 mL at 10/20/17 1824  . fentaNYL (SUBLIMAZE) injection 25 mcg  25 mcg Intravenous Q5 min PRN [name], MD      . haloperidol (HALDOL) tablet 5 mg  5 mg Oral Q8H PRN [name], [name], MD   5 mg at 10/16/17 0242   Or  . haloperidol lactate (HALDOL) injection 5 mg  5 mg Intramuscular Q8H PRN [name], [name], MD   5 mg at 10/01/17 0908  . lidocaine (LIDODERM) 5 % 1 patch  1 patch Transdermal Q24H [name], [name], MD   1 patch at 10/20/17 1815  . lidocaine-prilocaine (EMLA) cream 1 application  1 application Topical Q M,W,F Dragan Tamburrino, [name], MD   1 application at 97/02/63 1219  . LORazepam (ATIVAN) injection 2 mg  2 mg Intramuscular Q4H PRN Aviendha Azbell, [name], MD   2 mg at 10/14/17 0134  . LORazepam (ATIVAN) injection 2 mg  2 mg Intramuscular Q6H PRN Kaede Clendenen, [name], MD   2 mg at 10/15/17 1152  . LORazepam (ATIVAN) tablet 2 mg  2 mg Oral QHS [name], [name], MD   2 mg at 10/20/17 2013  . magnesium hydroxide (MILK OF MAGNESIA) suspension 30 mL  30 mL Oral Daily PRN Kaisei Gilbo T, MD      . metoprolol tartrate (LOPRESSOR) tablet 12.5 mg  12.5 mg Oral BID [name], MD   12.5 mg at 10/19/17 1727  . midodrine (PROAMATINE) tablet 10 mg  10 mg Oral TID WC [name], MD   10 mg at 10/20/17 1814  . multivitamin (RENA-VIT) tablet 1 tablet  1 tablet Oral QHS [name], [name], MD   1 tablet  at 10/20/17 2013  . OLANZapine zydis (ZYPREXA) disintegrating tablet 15 mg  15 mg Oral QHS [name], [name], MD   15 mg at 10/20/17 2013  . ondansetron (ZOFRAN) injection 4 mg  4 mg Intravenous Once PRN [name], MD      . pantoprazole (PROTONIX) EC tablet 40 mg  40 mg Oral Daily Timm Bonenberger, [name], MD   40 mg at 10/18/17 0850  .  pentafluoroprop-tetrafluoroeth (GEBAUERS) aerosol   Topical PRN [name], MD      . rOPINIRole (REQUIP) tablet 2 mg  2 mg Oral QHS [name], [name], MD   2 mg at 10/20/17 2013  . timolol (TIMOPTIC) 0.5 % ophthalmic solution 1 drop  1 drop Both Eyes BID Gerell Fortson, [name], MD   1 drop at 10/20/17 1814  . vitamin C (ASCORBIC ACID) tablet 500 mg  500 mg Oral BID [name], [name], MD   500 mg at 10/20/17 1814    Lab Results:  Results for orders placed or performed during the hospital encounter of 09/17/17 (from the past 48 hour(s))  CBC     Status: Abnormal   Collection Time: 10/20/17  2:39 PM  Result Value Ref Range   WBC 5.3 3.6 - 11.0 K/uL   RBC 2.82 (L) 3.80 - 5.20 MIL/uL   Hemoglobin 9.5 (L) 12.0 - 16.0 g/dL   HCT 26.8 (L) 35.0 - 47.0 %   MCV 95.2 80.0 - 100.0 fL   MCH 33.6 26.0 - 34.0 pg   MCHC 35.3 32.0 - 36.0 g/dL   RDW 14.9 (H) 11.5 - 14.5 %   Platelets 242 150 - 440 K/uL    Comment: Performed at [facility], [address] [place], [place] [address]  Renal function panel     Status: Abnormal   Collection Time: 10/20/17  2:39 PM  Result Value Ref Range   Sodium 139 135 - 145 mmol/L   Potassium 4.4 3.5 - 5.1 mmol/L   Chloride 101 101 - 111 mmol/L   CO2 26 22 - 32 mmol/L   Glucose, Bld 112 (H) 65 - 99 mg/dL   BUN 53 (H) 6 - 20 mg/dL   Creatinine, Ser 5.56 (H) 0.44 - 1.00 mg/dL   Calcium 9.3 8.9 - 10.3 mg/dL   Phosphorus 5.4 (H) 2.5 - 4.6 mg/dL   Albumin 3.3 (L) 3.5 - 5.0 g/dL   GFR calc non Af Amer 7 (L) >60 mL/min   GFR calc Af Amer 8 (L) >60 mL/min    Comment: (NOTE) The eGFR has been calculated using the CKD EPI equation. This calculation has not been validated in all clinical situations. eGFR's persistently <60 mL/min signify possible Chronic Kidney Disease.    Anion gap 12 5 - 15    Comment: Performed at [facility], [address] [place], [place] [address]  Renal function panel     Status: Abnormal   Collection Time: 10/21/17  12:06 AM  Result Value Ref Range   Sodium 140 135 - 145 mmol/L   Potassium 3.9 3.5 - 5.1 mmol/L   Chloride 98 (L) 101 - 111 mmol/L   CO2 30 22 - 32 mmol/L   Glucose, Bld 116 (H) 65 - 99 mg/dL   BUN 28 (H) 6 - 20 mg/dL   Creatinine, Ser 3.77 (H) 0.44 - 1.00 mg/dL   Calcium 9.3 8.9 - 10.3 mg/dL   Phosphorus 4.2 2.5 - 4.6 mg/dL   Albumin 3.5 3.5 - 5.0 g/dL  GFR calc non Af Amer 11 (L) >60 mL/min   GFR calc Af Amer 13 (L) >60 mL/min    Comment: (NOTE) The eGFR has been calculated using the CKD EPI equation. This calculation has not been validated in all clinical situations. eGFR's persistently <60 mL/min signify possible Chronic Kidney Disease.    Anion gap 12 5 - 15    Comment: Performed at [facility], [address] [place], [place] [address]  CBC     Status: Abnormal   Collection Time: 10/21/17 12:06 AM  Result Value Ref Range   WBC 6.9 3.6 - 11.0 K/uL   RBC 2.93 (L) 3.80 - 5.20 MIL/uL   Hemoglobin 9.5 (L) 12.0 - 16.0 g/dL   HCT 28.3 (L) 35.0 - 47.0 %   MCV 96.4 80.0 - 100.0 fL   MCH 32.3 26.0 - 34.0 pg   MCHC 33.5 32.0 - 36.0 g/dL   RDW 15.0 (H) 11.5 - 14.5 %   Platelets 210 150 - 440 K/uL    Comment: Performed at [facility], [address] [place], [place] [address]  Glucose, capillary     Status: Abnormal   Collection Time: 10/21/17  6:24 AM  Result Value Ref Range   Glucose-Capillary 103 (H) 65 - 99 mg/dL  Glucose, capillary     Status: Abnormal   Collection Time: 10/21/17 11:57 AM  Result Value Ref Range   Glucose-Capillary 117 (H) 65 - 99 mg/dL    Blood Alcohol level:  Lab Results  Component Value Date   ETH <10 82/95/6213    Metabolic Disorder Labs: Lab Results  Component Value Date   HGBA1C 4.8 09/18/2017   MPG 91.06 09/18/2017   No results found for: PROLACTIN Lab Results  Component Value Date   CHOL 251 (H) 09/18/2017   TRIG 124 09/18/2017   HDL 66 09/18/2017   CHOLHDL 3.8 09/18/2017   VLDL 25 09/18/2017   LDLCALC  160 (H) 09/18/2017   LDLCALC 43 05/14/2014    Physical Findings: AIMS: Facial and Oral Movements Muscles of Facial Expression: None, normal Lips and Perioral Area: None, normal Jaw: None, normal Tongue: None, normal,Extremity Movements Upper (arms, wrists, hands, fingers): None, normal Lower (legs, knees, ankles, toes): None, normal, Trunk Movements Neck, shoulders, hips: None, normal, Overall Severity Severity of abnormal movements (highest score from questions above): None, normal Incapacitation due to abnormal movements: None, normal Patient's awareness of abnormal movements (rate only patient's report): No Awareness, Dental Status Current problems with teeth and/or dentures?: No Does patient usually wear dentures?: No  CIWA:    COWS:     Musculoskeletal: Strength & Muscle Tone: within normal limits Gait & Station: unsteady Patient leans: N/A  Psychiatric Specialty Exam: Physical Exam  Nursing note and vitals reviewed. Constitutional: She appears well-developed and well-nourished.  HENT:  Head: Normocephalic and atraumatic.  Eyes: Pupils are equal, round, and reactive to light. Conjunctivae are normal.  Neck: Normal range of motion.  Cardiovascular: Normal heart sounds.  Respiratory: Effort normal. No respiratory distress.  GI: Soft.  Musculoskeletal: Normal range of motion.  Neurological: She is alert.  Skin: Skin is warm and dry.  Psychiatric: She has a normal mood and affect. Judgment normal. Her speech is delayed. She is slowed. Thought content is not paranoid. She expresses no homicidal and no suicidal ideation. She exhibits abnormal recent memory.    Review of Systems  Constitutional: Negative.   HENT: Negative.   Eyes: Negative.   Respiratory: Negative.   Cardiovascular:  Negative.   Gastrointestinal: Negative.   Musculoskeletal: Negative.   Skin: Negative.   Neurological: Negative.   Psychiatric/Behavioral: Positive for memory loss. Negative for  depression, hallucinations, substance abuse and suicidal ideas. The patient is nervous/anxious. The patient does not have insomnia.     Blood pressure (!) 136/57, pulse 96, temperature (!) 97.5 F (36.4 C), resp. rate 13, height '5\' 3"'$  (1.6 m), weight 71.7 kg (158 lb), SpO2 98 %.Body mass index is 27.99 kg/m.  General Appearance: Fairly Groomed  Eye Contact:  Fair  Speech:  Clear and Coherent  Volume:  Decreased  Mood:  Euthymic  Affect:  Congruent  Thought Process:  Goal Directed  Orientation:  Negative  Thought Content:  Logical  Suicidal Thoughts:  No  Homicidal Thoughts:  No  Memory:  Immediate;   Good Recent;   Fair Remote;   Fair  Judgement:  Fair  Insight:  Fair  Psychomotor Activity:  Normal  Concentration:  Concentration: Fair  Recall:  Poor  Fund of Knowledge:  Fair  Language:  Fair  Akathisia:  No  Handed:  Right  AIMS (if indicated):     Assets:  Desire for Improvement Housing Social Support  ADL's:  Impaired  Cognition:  Impaired,  Mild  Sleep:  Number of Hours: 4.75     Treatment Plan Summary: Daily contact with patient to assess and evaluate symptoms and progress in treatment, Medication management and Plan Patient has shown significant improvement as far as her mood.  She is still cognitively and behaviorally impaired to the degree that it would be impossible for her to live independently.  The patient's son and daughter are very agreeable to her living with them.  Plan at this point is discharge tomorrow.  I have restarted her Tegretol as this was considered very effective in the past.  I also would like to continue with at least a temporary period of maintenance ECT if not longer term treatment.  Family is agreeable to bringing her back next Wednesday.  They have arranged for dialysis in the community.. I am going to try to get her dialysis needs clarified before then.  No other change to treatment at this point.  Case reviewed with social work and  nursing.  Alethia Berthold, MD 10/21/2017, 3:04 PM

## 2017-10-21 NOTE — Anesthesia Procedure Notes (Signed)
Date/Time: 10/21/2017 11:40 AM Performed by: Dionne Bucy, CRNA Pre-anesthesia Checklist: Patient identified, Emergency Drugs available, Suction available and Patient being monitored Patient Re-evaluated:Patient Re-evaluated prior to induction Oxygen Delivery Method: Circle system utilized Preoxygenation: Pre-oxygenation with 100% oxygen Induction Type: IV induction Ventilation: Mask ventilation without difficulty and Mask ventilation throughout procedure Airway Equipment and Method: Bite block Placement Confirmation: positive ETCO2 Dental Injury: Teeth and Oropharynx as per pre-operative assessment

## 2017-10-22 MED ORDER — MIDODRINE HCL 10 MG PO TABS: 10.0000 mg | ORAL_TABLET | Freq: Every day | ORAL | 1 refills | Status: DC

## 2017-10-22 MED ORDER — ROPINIROLE HCL 2 MG PO TABS: 2.0000 mg | ORAL_TABLET | Freq: Every day | ORAL | 1 refills | Status: DC

## 2017-10-22 MED ORDER — LIDOCAINE 5 % EX PTCH: 1.0000 | MEDICATED_PATCH | CUTANEOUS | 0 refills | Status: DC

## 2017-10-22 MED ORDER — BRIMONIDINE TARTRATE 0.2 % OP SOLN: 1.0000 [drp] | Freq: Two times a day (BID) | OPHTHALMIC | 1 refills | Status: DC

## 2017-10-22 MED ORDER — PANTOPRAZOLE SODIUM 40 MG PO TBEC: 40.0000 mg | DELAYED_RELEASE_TABLET | Freq: Every day | ORAL | 1 refills | Status: DC

## 2017-10-22 MED ORDER — FAMOTIDINE 20 MG PO TABS: 20.0000 mg | ORAL_TABLET | Freq: Every day | ORAL | Status: DC

## 2017-10-22 MED ORDER — RENA-VITE PO TABS: 1.0000 | ORAL_TABLET | Freq: Every day | ORAL | 1 refills | Status: DC

## 2017-10-22 MED ORDER — LORAZEPAM 2 MG PO TABS: 2.0000 mg | ORAL_TABLET | Freq: Every day | ORAL | 0 refills | Status: DC

## 2017-10-22 MED ORDER — CARBAMAZEPINE 200 MG PO TABS: 200.0000 mg | ORAL_TABLET | Freq: Two times a day (BID) | ORAL | 1 refills | Status: DC

## 2017-10-22 MED ORDER — EPOETIN ALFA 4000 UNIT/ML IJ SOLN: 4000.0000 [IU] | INTRAMUSCULAR | 6 refills | Status: DC

## 2017-10-22 MED ORDER — METOPROLOL TARTRATE 25 MG PO TABS: 12.5000 mg | ORAL_TABLET | Freq: Two times a day (BID) | ORAL | 1 refills | Status: DC

## 2017-10-22 MED ORDER — OLANZAPINE 5 MG PO TABS: 15.0000 mg | ORAL_TABLET | Freq: Every day | ORAL | Status: DC

## 2017-10-22 MED ORDER — DICYCLOMINE HCL 20 MG PO TABS: 20.0000 mg | ORAL_TABLET | Freq: Three times a day (TID) | ORAL | 1 refills | Status: DC

## 2017-10-22 MED ORDER — TIMOLOL MALEATE 0.5 % OP SOLN: 1.0000 [drp] | Freq: Two times a day (BID) | OPHTHALMIC | 1 refills | Status: DC

## 2017-10-22 MED ORDER — OLANZAPINE 15 MG PO TABS: 15.0000 mg | ORAL_TABLET | Freq: Every day | ORAL | 1 refills | Status: DC

## 2017-10-22 MED ORDER — FAMOTIDINE 20 MG PO TABS: 20.0000 mg | ORAL_TABLET | Freq: Every day | ORAL | 1 refills | Status: DC

## 2017-10-22 NOTE — BHH Suicide Risk Assessment (Signed)
[facility] Discharge Suicide Risk Assessment   Principal Problem: Bipolar affective disorder, current episode manic with psychotic symptoms [facility]) Discharge Diagnoses:  Patient Active Problem List   Diagnosis Date Noted  . Palliative care encounter [Z51.5]   . Bipolar affective disorder, current episode depressed with psychotic symptoms ([place]) [F31.2] 09/17/2017  . Bipolar I disorder, most recent episode (or current) manic ([place]) [F31.10] 09/02/2017  . Acute delirium [R41.0] 09/02/2017  . Altered mental status [R41.82]   . Acute encephalopathy [G93.40] 09/01/2017  . ESRD on dialysis ([place]) [N18.6, Z99.2] 04/06/2017  . Complication of vascular access for dialysis [T82.9XXA] 04/06/2017  . Hematuria [R31.9] 12/18/2016  . Polycystic kidney [Q61.3] 10/12/2016  . Ruptured cyst of kidney [Q61.00] 10/03/2016  . HTN (hypertension) [I10] 10/01/2016  . Chronic kidney disease [N18.9] 10/01/2016  . Anemia associated with chronic renal failure [N18.9, D63.1] 09/24/2016  . Acute kidney insufficiency [N28.9] 07/28/2016  . Noninfectious diarrhea [K52.9]   . Benign neoplasm of cecum [D12.0]   . Benign neoplasm of ascending colon [D12.2]   . Diarrhea [R19.7]   . Nausea [R11.0]   . Gastric polyp [K31.7]   . Chronic constipation [K59.09] 12/27/2014  . Nausea with vomiting [R11.2] 12/27/2014  . Left sided abdominal pain [R10.9] 12/27/2014  . Personal history of colonic polyps [Z86.010] 05/10/2013    Total Time spent with patient: 45 minutes  Musculoskeletal: Strength & Muscle Tone: within normal limits Gait & Station: normal Patient leans: N/A  Psychiatric Specialty Exam: Review of Systems  Constitutional: Negative.   HENT: Negative.   Eyes: Negative.   Respiratory: Negative.   Cardiovascular: Negative.   Gastrointestinal: Negative.   Musculoskeletal: Positive for myalgias.  Skin: Negative.   Neurological: Negative.   Psychiatric/Behavioral: Positive for memory loss. Negative for depression,  hallucinations, substance abuse and suicidal ideas. The patient has insomnia. The patient is not nervous/anxious.     Blood pressure 98/60, pulse 100, temperature (!) 97.5 F (36.4 C), resp. rate 18, height 5\' 3"  (1.6 m), weight 71.7 kg (158 lb), SpO2 100 %.Body mass index is 27.99 kg/m.  General Appearance: Casual  Eye Contact::  Good  Speech:  Slow409  Volume:  Decreased  Mood:  Euthymic  Affect:  Appropriate  Thought Process:  Goal Directed  Orientation:  Full (Time, Place, and Person)  Thought Content:  Logical  Suicidal Thoughts:  No  Homicidal Thoughts:  No  Memory:  Immediate;   Fair Recent;   Fair Remote;   Fair  Judgement:  Good  Insight:  Good  Psychomotor Activity:  Normal  Concentration:  Fair  Recall:  [employer] [place]  Language: Fair  Akathisia:  No  Handed:  Right  AIMS (if indicated):     Assets:  Desire for Improvement Housing Social Support  Sleep:  Number of Hours: 4.5  Cognition: Impaired,  Mild  ADL's:  Intact   Mental Status Per Nursing Assessment::   On Admission:  NA  Demographic Factors:  Caucasian and Unemployed  Loss Factors: Decline in physical health  Historical Factors: NA  Risk Reduction Factors:   Sense of responsibility to family, Religious beliefs about death, Living with another person, especially a relative, Positive social support and Positive therapeutic relationship  Continued Clinical Symptoms:  Bipolar Disorder:   Mixed State  Cognitive Features That Contribute To Risk:  Loss of executive function    Suicide Risk:  Minimal: No identifiable suicidal ideation.  Patients presenting with no risk factors but with morbid ruminations; may be classified as  minimal risk based on the severity of the depressive symptoms  Follow-up Information    James Ivanoff MD Follow up.   Contact information: Address: [id number], [place], [place] [address] Phone: [phone] Fax: [phone]           Plan Of Care/Follow-up recommendations:  Activity:  as tolerated Diet:  regular Other:  ECT follow up next Wednesday  Alethia Berthold, MD 10/22/2017, 11:00 AM

## 2017-10-22 NOTE — BHH Group Notes (Signed)
LCSW Group Therapy Note 10/22/2017 9:00 AM  Type of Therapy and Topic:  Group Therapy:  Setting Goals  Participation Level:  None  Description of Group: In this process group, patients discussed using strengths to work toward goals and address challenges.  Patients identified two positive things about themselves and one goal they were working on.  Patients were given the opportunity to share openly and support each other's plan for self-empowerment.  The group discussed the value of gratitude and were encouraged to have a daily reflection of positive characteristics or circumstances.  Patients were encouraged to identify a plan to utilize their strengths to work on current challenges and goals.  Therapeutic Goals 1. Patient will verbalize personal strengths/positive qualities and relate how these can assist with achieving desired personal goals 2. Patients will verbalize affirmation of peers plans for personal change and goal setting 3. Patients will explore the value of gratitude and positive focus as related to successful achievement of goals 4. Patients will verbalize a plan for regular reinforcement of personal positive qualities and circumstances.  Summary of Patient Progress:  Ayari presented to Setting Goals group, but did not chose to actively participate in group discussion although encouraged by CSW.  She only stayed in the group for approximately 10 minutes before walking out.     Therapeutic Modalities Cognitive Behavioral Therapy Motivational Interviewing    Devona Konig, [place] 10/22/2017 10:36 AM

## 2017-10-22 NOTE — [facility] Group Notes (Signed)
10/22/2017  Time: 1PM  Type of Therapy/Topic:  Group Therapy:  Balance in Life  Participation Level:  Did Not Attend  Description of Group:   This group will address the concept of balance and how it feels and looks when one is unbalanced. Patients will be encouraged to process areas in their lives that are out of balance and identify reasons for remaining unbalanced. Facilitators will guide patients in utilizing problem-solving interventions to address and correct the stressor making their life unbalanced. Understanding and applying boundaries will be explored and addressed for obtaining and maintaining a balanced life. Patients will be encouraged to explore ways to assertively make their unbalanced needs known to significant others in their lives, using other group members and facilitator for support and feedback.  Therapeutic Goals: 1. Patient will identify two or more emotions or situations they have that consume much of in their lives. 2. Patient will identify signs/triggers that life has become out of balance:  3. Patient will identify two ways to set boundaries in order to achieve balance in their lives:  4. Patient will demonstrate ability to communicate their needs through discussion and/or role plays  Summary of Patient Progress: Pt was invited to attend group but chose not to attend. CSW will continue to encourage pt to attend group throughout their admission.   Therapeutic Modalities:   Cognitive Behavioral Therapy Solution-Focused Therapy Assertiveness Training  Alden Hipp, MSW, LCSW Clinical Social Worker 10/22/2017 1:56 PM

## 2017-10-22 NOTE — Progress Notes (Signed)
  [facility] Adult Case Management Discharge Plan :  Will you be returning to the same living situation after discharge:  No. At discharge, do you have transportation home?: Yes,  Son will come and pick you up Do you have the ability to pay for your medications: Yes,  Insurance  Release of information consent forms completed and in the chart;  Patient's signature needed at discharge.  Patient to Follow up at: Follow-up Information    James Ivanoff MD. Go on 10/28/2017.   Why:  Please attend your follow up appointment on Wednesday 10/28/2017 at 4:30PM. Thank you. Contact information: Address: [address], [place], [place] [address] Phone: [phone] Fax: [phone]          Next level of care provider has access to [place] and Suicide Prevention discussed: Yes,  completed with patient. Patient refused family contact  Have you used any form of tobacco in the last 30 days? (Cigarettes, Smokeless Tobacco, Cigars, and/or Pipes): No  Has patient been referred to the Quitline?: N/A patient is not a smoker  Patient has been referred for addiction treatment: N/A  Darin Engels, LCSW 10/22/2017, 11:57 AM

## 2017-10-22 NOTE — Progress Notes (Signed)
[facility]  ROUNDING NOTE   Subjective:   Hemodialysis treatment yesterday. Tolerated treatment well.   Objective:  Vital signs in last 24 hours:  Temp:  [97.5 F (36.4 C)-97.7 F (36.5 C)] 97.5 F (36.4 C) (04/03 1219) Pulse Rate:  [94-144] 100 (04/04 0656) Resp:  [13-27] 18 (04/04 0613) BP: (98-151)/(49-80) 98/60 (04/04 0614) SpO2:  [90 %-100 %] 100 % (04/04 0614)  Weight change:  Filed Weights   10/19/17 0916 10/21/17 0926  Weight: 73 kg (161 lb) 71.7 kg (158 lb)    Intake/Output: I/O last 3 completed shifts: In: 340 [P.O.:240; I.V.:100] Out: -    Intake/Output this shift:  Total I/O In: 120 [P.O.:120] Out: -   Physical Exam: General: NAD, laying in bed  Head: Normocephalic, atraumatic. Hearing intact  Eyes: Anicteric  Neck: Supple, trachea midline  Lungs:  Clear to auscultation  Heart: Regular rate and rhythm  Abdomen:  Soft, nontender  Extremities: no peripheral edema.  Psych:  Interactive today  Neurologic: Nonfocal, moving all four extremities  Skin: No lesions  Access: Left AVF    Basic Metabolic Panel: Recent Labs  Lab 10/15/17 1155 10/17/17 1009 10/20/17 1439 10/21/17 0006  NA  --  138 139 140  K  --  3.9 4.4 3.9  CL  --  98* 101 98*  CO2  --  27 26 30   GLUCOSE  --  131* 112* 116*  BUN  --  45* 53* 28*  CREATININE  --  5.31* 5.56* 3.77*  CALCIUM  --  9.6 9.3 9.3  PHOS 3.6 2.9 5.4* 4.2    Liver Function Tests: Recent Labs  Lab 10/17/17 1009 10/20/17 1439 10/21/17 0006  ALBUMIN 3.3* 3.3* 3.5   No results for input(s): LIPASE, AMYLASE in the last 168 hours. No results for input(s): AMMONIA in the last 168 hours.  CBC: Recent Labs  Lab 10/17/17 1009 10/20/17 1439 10/21/17 0006  WBC 6.9 5.3 6.9  HGB 9.8* 9.5* 9.5*  HCT 29.0* 26.8* 28.3*  MCV 96.2 95.2 96.4  PLT 229 242 210    Cardiac Enzymes: No results for input(s): CKTOTAL, CKMB, CKMBINDEX, TROPONINI in the last 168 hours.  BNP: Invalid input(s):  POCBNP  CBG: Recent Labs  Lab 10/19/17 0700 10/19/17 0703 10/19/17 1112 10/21/17 0624 10/21/17 1157  GLUCAP 124* 93 109* 103* 117*    Microbiology: Results for orders placed or performed during the hospital encounter of 09/01/17  Urine culture     Status: Abnormal   Collection Time: 09/01/17  4:32 PM  Result Value Ref Range Status   Specimen Description   Final    URINE, RANDOM Performed at [facility], [address], [place], [place] [address]    Special Requests   Final    NONE Performed at [facility], [address] [place], [place] [address]    Culture MULTIPLE SPECIES PRESENT, SUGGEST RECOLLECTION (A)  Final   Report Status 09/03/2017 FINAL  Final  Culture, blood (routine x 2)     Status: None   Collection Time: 09/01/17  6:05 PM  Result Value Ref Range Status   Specimen Description BLOOD RIGHT HAND  Final   Special Requests   Final    BOTTLES DRAWN AEROBIC AND ANAEROBIC Blood Culture results may not be optimal due to an excessive volume of blood received in culture bottles   Culture   Final    NO GROWTH 5 DAYS Performed at [facility], [address] [place],  [place] [address]    Report Status 09/06/2017 FINAL  Final  Culture, blood (routine x 2)     Status: None   Collection Time: 09/01/17  6:34 PM  Result Value Ref Range Status   Specimen Description BLOOD RAC  Final   Special Requests   Final    BOTTLES DRAWN AEROBIC AND ANAEROBIC Blood Culture adequate volume   Culture   Final    NO GROWTH 5 DAYS Performed at [facility], [address] [place], [place] [address]    Report Status 09/06/2017 FINAL  Final    Coagulation Studies: No results for input(s): LABPROT, INR in the last 72 hours.  Urinalysis: No results for input(s): COLORURINE, LABSPEC, PHURINE, GLUCOSEU, HGBUR, BILIRUBINUR, KETONESUR, PROTEINUR, UROBILINOGEN, NITRITE, LEUKOCYTESUR in the last 72 hours.  Invalid input(s): APPERANCEUR     Imaging: No results found.   Medications:   . sodium chloride 20 mL/hr at 10/19/17 0925  . sodium chloride    .  ceFAZolin (ANCEF) IV     . brimonidine  1 drop Both Eyes BID  . carbamazepine  200 mg Oral BID  . cyanocobalamin  1,000 mcg Intramuscular Q30 days  . dicyclomine  20 mg Oral TID AC  . epoetin (EPOGEN/PROCRIT) injection  4,000 Units Intravenous Q M,W,F-HD  . famotidine  20 mg Oral BID  . feeding supplement (NEPRO CARB STEADY)  237 mL Oral BID BM  . lidocaine  1 patch Transdermal Q24H  . lidocaine-prilocaine  1 application Topical Q M,W,F  . LORazepam  2 mg Oral QHS  . metoprolol tartrate  12.5 mg Oral BID  . midodrine  10 mg Oral TID WC  . multivitamin  1 tablet Oral QHS  . OLANZapine zydis  15 mg Oral QHS  . pantoprazole  40 mg Oral Daily  . rOPINIRole  2 mg Oral QHS  . timolol  1 drop Both Eyes BID  . vitamin C  500 mg Oral BID   acetaminophen, alum & mag hydroxide-simeth, bisacodyl, fentaNYL (SUBLIMAZE) injection, haloperidol **OR** haloperidol lactate, LORazepam, LORazepam, magnesium hydroxide, ondansetron (ZOFRAN) IV, pentafluoroprop-tetrafluoroeth  Assessment/ Plan:  [name] is a 72 y.o. white female with end stage renal disease on hemodialysis, diverticulosis, hypotension,  bipolar disorder, polycystic kidney disease   [facility]/MWF  1. ESRD on HD:  Resume MWF schedule   2. Anemia of CKD:   -  Continue Epogen with dialysis.  3. Secondary hyperparathyroidism: phosphorus and calcium at goal.  Not currently on binders.   4. Hypotension:  Maintain the patient on midodrine to help maintain blood pressure.   LOS: [place] 4/4/201910:33 AM

## 2017-10-22 NOTE — Progress Notes (Signed)
Recreation Therapy Notes  Date: 10/22/2017  Time: 9:30 am  Location: Craft Room  Behavioral response: N/A  Intervention Topic: Team Work  Discussion/Intervention: Patient did not attend group.   Clinical Observations/Feedback:  Patient did not attend group.   Sharryn Belding LRT/CTRS          Gelena Klosinski 10/22/2017 11:52 AM

## 2017-10-24 DIAGNOSIS — Z992 Dependence on renal dialysis: Secondary | ICD-10-CM | POA: Diagnosis not present

## 2017-10-24 DIAGNOSIS — N186 End stage renal disease: Secondary | ICD-10-CM | POA: Diagnosis not present

## 2017-10-25 ENCOUNTER — Other Ambulatory Visit: Payer: Self-pay | Admitting: Psychiatry

## 2017-10-26 ENCOUNTER — Telehealth: Payer: Self-pay

## 2017-10-27 DIAGNOSIS — Z1159 Encounter for screening for other viral diseases: Secondary | ICD-10-CM | POA: Diagnosis not present

## 2017-10-27 DIAGNOSIS — N186 End stage renal disease: Secondary | ICD-10-CM | POA: Diagnosis not present

## 2017-10-27 DIAGNOSIS — Z992 Dependence on renal dialysis: Secondary | ICD-10-CM | POA: Diagnosis not present

## 2017-10-28 ENCOUNTER — Other Ambulatory Visit: Payer: Self-pay | Admitting: Psychiatry

## 2017-10-28 ENCOUNTER — Other Ambulatory Visit
Admission: RE | Admit: 2017-10-28 | Discharge: 2017-10-28 | Disposition: A | Discharge status: Home / Self Care | Payer: Medicare HMO | Source: Ambulatory Visit | Attending: Internal Medicine | Admitting: Internal Medicine

## 2017-10-28 DIAGNOSIS — Z5181 Encounter for therapeutic drug level monitoring: Secondary | ICD-10-CM | POA: Diagnosis not present

## 2017-10-28 DIAGNOSIS — F3177 Bipolar disorder, in partial remission, most recent episode mixed: Secondary | ICD-10-CM | POA: Diagnosis not present

## 2017-10-28 LAB — CARBAMAZEPINE LEVEL, TOTAL: Carbamazepine Lvl: 10.9 ug/mL (ref 4.0–12.0)

## 2017-10-29 ENCOUNTER — Other Ambulatory Visit (INDEPENDENT_AMBULATORY_CARE_PROVIDER_SITE_OTHER): Payer: Self-pay | Admitting: Vascular Surgery

## 2017-10-29 ENCOUNTER — Telehealth (INDEPENDENT_AMBULATORY_CARE_PROVIDER_SITE_OTHER): Payer: Self-pay | Admitting: Vascular Surgery

## 2017-10-29 DIAGNOSIS — N186 End stage renal disease: Secondary | ICD-10-CM | POA: Diagnosis not present

## 2017-10-29 DIAGNOSIS — Z992 Dependence on renal dialysis: Secondary | ICD-10-CM | POA: Diagnosis not present

## 2017-10-29 NOTE — Telephone Encounter (Signed)
I spoke with the daughter in law [name]) inquiring if the patient need to take a certain medication before procedure and inform her that all she have do is what has been instructed on the sheet

## 2017-10-31 DIAGNOSIS — N186 End stage renal disease: Secondary | ICD-10-CM | POA: Diagnosis not present

## 2017-10-31 DIAGNOSIS — Z992 Dependence on renal dialysis: Secondary | ICD-10-CM | POA: Diagnosis not present

## 2017-11-01 MED ORDER — CEFAZOLIN SODIUM-DEXTROSE 1-4 GM/50ML-% IV SOLN
1.0000 g | Freq: Once | INTRAVENOUS | Status: AC
  Administered 2017-11-02: 1 g via INTRAVENOUS

## 2017-11-02 ENCOUNTER — Encounter
Admission: RE | Disposition: A | Discharge status: Home / Self Care | Payer: Self-pay | Source: Ambulatory Visit | Attending: Vascular Surgery

## 2017-11-02 ENCOUNTER — Ambulatory Visit
Admission: RE | Admit: 2017-11-02 | Discharge: 2017-11-02 | Disposition: A | Discharge status: Home / Self Care | Payer: Medicare HMO | Source: Ambulatory Visit | Attending: Vascular Surgery | Admitting: Vascular Surgery

## 2017-11-02 DIAGNOSIS — I120 Hypertensive chronic kidney disease with stage 5 chronic kidney disease or end stage renal disease: Secondary | ICD-10-CM | POA: Diagnosis not present

## 2017-11-02 DIAGNOSIS — T82868A Thrombosis of vascular prosthetic devices, implants and grafts, initial encounter: Secondary | ICD-10-CM | POA: Diagnosis not present

## 2017-11-02 DIAGNOSIS — Z885 Allergy status to narcotic agent status: Secondary | ICD-10-CM | POA: Insufficient documentation

## 2017-11-02 DIAGNOSIS — Y832 Surgical operation with anastomosis, bypass or graft as the cause of abnormal reaction of the patient, or of later complication, without mention of misadventure at the time of the procedure: Secondary | ICD-10-CM | POA: Insufficient documentation

## 2017-11-02 DIAGNOSIS — T82858A Stenosis of vascular prosthetic devices, implants and grafts, initial encounter: Secondary | ICD-10-CM | POA: Insufficient documentation

## 2017-11-02 DIAGNOSIS — Z992 Dependence on renal dialysis: Secondary | ICD-10-CM | POA: Diagnosis not present

## 2017-11-02 DIAGNOSIS — Z9889 Other specified postprocedural states: Secondary | ICD-10-CM | POA: Diagnosis not present

## 2017-11-02 DIAGNOSIS — N186 End stage renal disease: Secondary | ICD-10-CM | POA: Insufficient documentation

## 2017-11-02 DIAGNOSIS — T82898A Other specified complication of vascular prosthetic devices, implants and grafts, initial encounter: Secondary | ICD-10-CM | POA: Diagnosis present

## 2017-11-02 DIAGNOSIS — Z823 Family history of stroke: Secondary | ICD-10-CM | POA: Insufficient documentation

## 2017-11-02 DIAGNOSIS — Z9109 Other allergy status, other than to drugs and biological substances: Secondary | ICD-10-CM | POA: Insufficient documentation

## 2017-11-02 DIAGNOSIS — F319 Bipolar disorder, unspecified: Secondary | ICD-10-CM | POA: Insufficient documentation

## 2017-11-02 DIAGNOSIS — K219 Gastro-esophageal reflux disease without esophagitis: Secondary | ICD-10-CM | POA: Diagnosis not present

## 2017-11-02 DIAGNOSIS — Z86010 Personal history of colonic polyps: Secondary | ICD-10-CM | POA: Insufficient documentation

## 2017-11-02 DIAGNOSIS — Z90710 Acquired absence of both cervix and uterus: Secondary | ICD-10-CM | POA: Insufficient documentation

## 2017-11-02 DIAGNOSIS — Z9049 Acquired absence of other specified parts of digestive tract: Secondary | ICD-10-CM | POA: Insufficient documentation

## 2017-11-02 DIAGNOSIS — Z888 Allergy status to other drugs, medicaments and biological substances status: Secondary | ICD-10-CM | POA: Insufficient documentation

## 2017-11-02 DIAGNOSIS — Z8249 Family history of ischemic heart disease and other diseases of the circulatory system: Secondary | ICD-10-CM | POA: Insufficient documentation

## 2017-11-02 HISTORY — PX: A/V FISTULAGRAM: CATH118298

## 2017-11-02 HISTORY — PX: A/V SHUNT INTERVENTION: CATH118220

## 2017-11-02 LAB — POTASSIUM (ARMC VASCULAR LAB ONLY): POTASSIUM (ARMC VASCULAR LAB): 3.9 (ref 3.5–5.1)

## 2017-11-02 SURGERY — A/V FISTULAGRAM
Anesthesia: Moderate Sedation

## 2017-11-02 MED ORDER — SODIUM CHLORIDE 0.9 % IV SOLN
INTRAVENOUS | Status: DC
  Administered 2017-11-02: 11:00:00 via INTRAVENOUS

## 2017-11-02 MED ORDER — FENTANYL CITRATE (PF) 100 MCG/2ML IJ SOLN
INTRAMUSCULAR | Status: AC
  Filled 2017-11-02: qty 2

## 2017-11-02 MED ORDER — MIDAZOLAM HCL 2 MG/2ML IJ SOLN
INTRAMUSCULAR | Status: DC | PRN
  Administered 2017-11-02: 1 mg via INTRAVENOUS
  Administered 2017-11-02: 2 mg via INTRAVENOUS
  Administered 2017-11-02 (×2): 1 mg via INTRAVENOUS

## 2017-11-02 MED ORDER — MIDAZOLAM HCL 5 MG/5ML IJ SOLN
INTRAMUSCULAR | Status: AC
  Filled 2017-11-02: qty 5

## 2017-11-02 MED ORDER — FAMOTIDINE 20 MG PO TABS: 40.0000 mg | ORAL_TABLET | ORAL | Status: DC | PRN

## 2017-11-02 MED ORDER — METHYLPREDNISOLONE SODIUM SUCC 125 MG IJ SOLR: 125.0000 mg | INTRAMUSCULAR | Status: DC | PRN

## 2017-11-02 MED ORDER — HEPARIN (PORCINE) IN NACL 2-0.9 UNIT/ML-% IJ SOLN
INTRAMUSCULAR | Status: AC
  Filled 2017-11-02: qty 1000

## 2017-11-02 MED ORDER — ONDANSETRON HCL 4 MG/2ML IJ SOLN: 4.0000 mg | Freq: Four times a day (QID) | INTRAMUSCULAR | Status: DC | PRN

## 2017-11-02 MED ORDER — HEPARIN SODIUM (PORCINE) 1000 UNIT/ML IJ SOLN
INTRAMUSCULAR | Status: AC
  Filled 2017-11-02: qty 1

## 2017-11-02 MED ORDER — IOPAMIDOL (ISOVUE-300) INJECTION 61%
INTRAVENOUS | Status: DC | PRN
  Administered 2017-11-02: 35 mL via INTRAVENOUS

## 2017-11-02 MED ORDER — FENTANYL CITRATE (PF) 100 MCG/2ML IJ SOLN
INTRAMUSCULAR | Status: DC | PRN
  Administered 2017-11-02: 25 ug via INTRAVENOUS
  Administered 2017-11-02: 50 ug via INTRAVENOUS
  Administered 2017-11-02 (×2): 25 ug via INTRAVENOUS

## 2017-11-02 MED ORDER — LIDOCAINE-EPINEPHRINE (PF) 1 %-1:200000 IJ SOLN
INTRAMUSCULAR | Status: AC
  Filled 2017-11-02: qty 30

## 2017-11-02 MED ORDER — HYDROMORPHONE HCL 1 MG/ML IJ SOLN: 1.0000 mg | Freq: Once | INTRAMUSCULAR | Status: DC | PRN

## 2017-11-02 SURGICAL SUPPLY — 17 items
BALLN LUTONIX 6X220X130 (BALLOONS) ×3
BALLOON LUTONIX 6X220X130 (BALLOONS) ×2 IMPLANT
CANNULA 5F STIFF (CANNULA) ×3 IMPLANT
CATH BEACON 5 .035 40 KMP TP (CATHETERS) ×2 IMPLANT
CATH BEACON 5 .038 40 KMP TP (CATHETERS) ×1
COVER PROBE U/S 5X48 (MISCELLANEOUS) ×3 IMPLANT
DEVICE PRESTO INFLATION (MISCELLANEOUS) ×3 IMPLANT
DEVICE TORQUE .025-.038 (MISCELLANEOUS) ×3 IMPLANT
DRAPE BRACHIAL (DRAPES) ×3 IMPLANT
GUIDEWIRE ZIPWIRE .035X180CM (WIRE) ×3 IMPLANT
PACK ANGIOGRAPHY (CUSTOM PROCEDURE TRAY) ×3 IMPLANT
SHEATH BRITE TIP 6FRX5.5 (SHEATH) ×3 IMPLANT
SHEATH BRITE TIP 7FRX5.5 (SHEATH) ×3 IMPLANT
STENT VIABAHN 8X250X120 (Permanent Stent) ×3 IMPLANT
SUT MNCRL AB 4-0 PS2 18 (SUTURE) ×3 IMPLANT
WIRE G V18X300CM (WIRE) ×3 IMPLANT
WIRE MAGIC TOR.035 180C (WIRE) ×3 IMPLANT

## 2017-11-02 NOTE — Op Note (Signed)
[facility]    OPERATIVE NOTE   PROCEDURE: 1.   Left brachiocephalic arteriovenous fistula cannulation under ultrasound guidance 2.   Left arm fistulagram including central venogram 3.   Percutaneous transluminal angioplasty of left upper arm cephalic vein and cephalic vein subclavian vein confluence with 2 inflations with a 6 mm diameter by 22 cm length Lutonix drug-coated angioplasty balloon 4.   Viabahn stent placement to the left upper arm cephalic vein and cephalic vein subclavian vein confluence with an 8 mm diameter by 25 cm length covered stent  PRE-OPERATIVE DIAGNOSIS: 1. ESRD 2. Poorly functional left brachiocephalic AVF  POST-OPERATIVE DIAGNOSIS: same as above with occlusion of the upper arm cephalic vein from the mid upper arm to the cephalic vein subclavian vein confluence  SURGEON: Leotis Pain, MD  ANESTHESIA: local with MCS  ESTIMATED BLOOD LOSS: 5 cc  FINDING(S): occlusion of the upper arm cephalic vein from the mid upper arm to the cephalic vein subclavian vein confluence. Remainder of the central venous circulation was patent.  Access sites were aneurysmal.   SPECIMEN(S):  None  CONTRAST: 35 cc  FLUORO TIME: 5.7 minutes  MODERATE CONSCIOUS SEDATION TIME: Approximately 30 minutes with 5 mg of Versed and 125 mcg of Fentanyl   INDICATIONS: Joyce [name] is a 72 y.o. female who presents with malfunctioning left brachiocephalic arteriovenous fistula.  The patient is scheduled for left fistulagram.  The patient is aware the risks include but are not limited to: bleeding, infection, thrombosis of the cannulated access, and possible anaphylactic reaction to the contrast.  The patient is aware of the risks of the procedure and elects to proceed forward.  DESCRIPTION: After full informed written consent was obtained, the patient was brought back to the angiography suite and placed supine upon the angiography table.  The patient was connected to  monitoring equipment. Moderate conscious sedation was administered with a face to face encounter with the patient throughout the procedure with my supervision of the RN administering medicines and monitoring the patient's vital signs and mental status throughout from the start of the procedure until the patient was taken to the recovery room. The left arm was prepped and draped in the standard fashion for a percutaneous access intervention.  Under ultrasound guidance, the left brachiocephalic arteriovenous fistula was cannulated with a micropuncture needle under direct ultrasound guidance and a permanent image was performed.  The microwire was advanced into the fistula and the needle was exchanged for the a microsheath.  I then upsized to a 6 Fr Sheath and imaging was performed.  Hand injections were completed to image the access including the central venous system. This demonstrated occlusion of the upper arm cephalic vein from the mid upper arm to the cephalic vein subclavian vein confluence. Remainder of the central venous circulation was patent.  Access sites were aneurysmal.  Based on the images, this patient will need intervention to this occlusion to make the fistula more functional. I then gave the patient 3000 units of intravenous heparin.  I then crossed the stenosis with a Glidewire and a Kumpe catheter with minimal difficulty.  I confirmed intraluminal flow in the subclavian vein.  I then placed a Magic Tourqe wire.  Based on the imaging, a 6 mm x 22 cm Lutonix drug-coated angioplasty balloon was selected.  The balloon was centered around the upper arm cephalic vein stenosis and inflated to 10 ATM for 1 minute(s).  It was then advanced across the cephalic vein subclavian vein confluence  and inflated to 6 atm for 1 minute with the most proximal extent of the balloon in the subclavian vein.  On completion imaging, a greater than 50 % residual stenosis was present as well as some extravasation.  I then  exchanged for a 7 French sheath and a 0.018 wire and placed an 8 mm diameter by 25 cm length Viabahn covered stent from the cephalic vein subclavian vein confluence back to the mid upper arm cephalic vein.  This was postdilated with a 7 mm balloon with excellent angiographic completion result and less than 15% residual stenosis.   Based on the completion imaging, no further intervention is necessary.  The wire and balloon were removed from the sheath.  A 4-0 Monocryl purse-string suture was sewn around the sheath.  The sheath was removed while tying down the suture.  A sterile bandage was applied to the puncture site.  COMPLICATIONS: None  CONDITION: Stable   Leotis Pain  11/02/2017 12:01 PM   This note was created with Dragon Medical transcription system. Any errors in dictation are purely unintentional.

## 2017-11-02 NOTE — Discharge Instructions (Signed)

## 2017-11-02 NOTE — H&P (Addendum)
[facility] History & Physical Update  The patient was interviewed and re-examined. See my consult note from 3/28 for full details. The patient's previous History and Physical has been reviewed and is unchanged.  She still needs the fistulogram that was scheduled while she was in hospital but she was fed and the procedure was not performed.  She was then discharged prior to the rescheduled procedure date.  There is no change in the plan of care. We plan to proceed with the scheduled procedure of a fistulogram.  Leotis Pain, MD  11/02/2017, 10:08 AM

## 2017-11-03 ENCOUNTER — Encounter: Payer: Self-pay | Admitting: Vascular Surgery

## 2017-11-03 DIAGNOSIS — N186 End stage renal disease: Secondary | ICD-10-CM | POA: Diagnosis not present

## 2017-11-03 DIAGNOSIS — Z992 Dependence on renal dialysis: Secondary | ICD-10-CM | POA: Diagnosis not present

## 2017-11-05 DIAGNOSIS — Z992 Dependence on renal dialysis: Secondary | ICD-10-CM | POA: Diagnosis not present

## 2017-11-05 DIAGNOSIS — N186 End stage renal disease: Secondary | ICD-10-CM | POA: Diagnosis not present

## 2017-11-07 DIAGNOSIS — Z992 Dependence on renal dialysis: Secondary | ICD-10-CM | POA: Diagnosis not present

## 2017-11-07 DIAGNOSIS — N186 End stage renal disease: Secondary | ICD-10-CM | POA: Diagnosis not present

## 2017-11-09 DIAGNOSIS — N186 End stage renal disease: Secondary | ICD-10-CM | POA: Diagnosis not present

## 2017-11-09 DIAGNOSIS — Z992 Dependence on renal dialysis: Secondary | ICD-10-CM | POA: Diagnosis not present

## 2017-11-10 NOTE — Discharge Summary (Signed)
Physician Discharge Summary Note  Patient:  Joyce [name] is an 72 y.o., female MRN:  [id number] DOB:  03-02-1946 Patient phone:  [phone] (home)  Patient address:   [place] [address],  Total Time spent with patient: 45 minutes  Date of Admission:  09/17/2017 Date of Discharge: October 22, 2017  Reason for Admission: Patient was admitted to the hospital because of what appeared to be severe depression and failure to thrive refusal to cooperate with dialysis.  Patient had been declining for several months with worsening symptoms of depression.  Principal Problem: Bipolar affective disorder, current episode manic with psychotic symptoms [facility]) Discharge Diagnoses: Patient Active Problem List   Diagnosis Date Noted  . Palliative care encounter [Z51.5]   . Bipolar affective disorder, current episode depressed with psychotic symptoms ([place]) [F31.2] 09/17/2017  . Bipolar I disorder, most recent episode (or current) manic ([place]) [F31.10] 09/02/2017  . Acute delirium [R41.0] 09/02/2017  . Altered mental status [R41.82]   . Acute encephalopathy [G93.40] 09/01/2017  . ESRD on dialysis ([place]) [N18.6, Z99.2] 04/06/2017  . Complication of vascular access for dialysis [T82.9XXA] 04/06/2017  . Hematuria [R31.9] 12/18/2016  . Polycystic kidney [Q61.3] 10/12/2016  . Ruptured cyst of kidney [Q61.00] 10/03/2016  . HTN (hypertension) [I10] 10/01/2016  . Chronic kidney disease [N18.9] 10/01/2016  . Anemia associated with chronic renal failure [N18.9, D63.1] 09/24/2016  . Acute kidney insufficiency [N28.9] 07/28/2016  . Noninfectious diarrhea [K52.9]   . Benign neoplasm of cecum [D12.0]   . Benign neoplasm of ascending colon [D12.2]   . Diarrhea [R19.7]   . Nausea [R11.0]   . Gastric polyp [K31.7]   . Chronic constipation [K59.09] 12/27/2014  . Nausea with vomiting [R11.2] 12/27/2014  . Left sided abdominal pain [R10.9] 12/27/2014  . Personal history of colonic polyps  [Z86.010] 05/10/2013    Past Psychiatric History: Long history of bipolar disorder with a lot of worsening since beginning dialysis  Past Medical History:  Past Medical History:  Diagnosis Date  . Anal fissure   . Bipolar affective disorder ([place])   . CKD (chronic kidney disease)    Dr Holley Raring [name] 4  . Colon polyps   . Diverticulitis   . Diverticulitis   . Family history of adverse reaction to anesthesia    mom - PONV  . GERD (gastroesophageal reflux disease)   . Headache    migraines - none over 10 yrs  . Heart murmur   . History of hiatal hernia   . Hypertension   . Pancreatitis    Valproic acid  . Vertigo     Past Surgical History:  Procedure Laterality Date  . A/V FISTULAGRAM Left 04/14/2017   Procedure: A/V Fistulagram;  Surgeon: Katha Cabal, MD;  Location: [place] [facility];  Service: Cardiovascular;  Laterality: Left;  . A/V FISTULAGRAM Left 06/09/2017   Procedure: A/V FISTULAGRAM;  Surgeon: Katha Cabal, MD;  Location: [place] [facility];  Service: Cardiovascular;  Laterality: Left;  . A/V FISTULAGRAM Left 11/02/2017   Procedure: A/V FISTULAGRAM;  Surgeon: Algernon Huxley, MD;  Location: [place] [facility];  Service: Cardiovascular;  Laterality: Left;  . A/V SHUNT INTERVENTION N/A 11/02/2017   Procedure: A/V SHUNT INTERVENTION;  Surgeon: Algernon Huxley, MD;  Location: [place] [facility];  Service: Cardiovascular;  Laterality: N/A;  . ABDOMINAL HYSTERECTOMY  1990 ?  . AV FISTULA PLACEMENT  4/30  . BREAST EXCISIONAL BIOPSY Left 1994   neg surgical bx  .  CHOLECYSTECTOMY  2003  . COLONOSCOPY  2014   Dr. Jamal Collin  . COLONOSCOPY WITH PROPOFOL N/A 09/24/2015   Procedure: COLONOSCOPY WITH random colon byopies.;  Surgeon: Lucilla Lame, MD;  Location: [facility];  Service: Endoscopy;  Laterality: N/A;  . ESOPHAGOGASTRODUODENOSCOPY (EGD) WITH PROPOFOL N/A 09/24/2015   Procedure: ESOPHAGOGASTRODUODENOSCOPY (EGD) ;  Surgeon: Lucilla Lame, MD;  Location:  [place];  Service: Endoscopy;  Laterality: N/A;  . EYE SURGERY    . PERIPHERAL VASCULAR CATHETERIZATION N/A 05/29/2015   Procedure: A/V Shuntogram/Fistulagram;  Surgeon: Katha Cabal, MD;  Location: [place] [facility];  Service: Cardiovascular;  Laterality: N/A;  . PERIPHERAL VASCULAR CATHETERIZATION N/A 05/29/2015   Procedure: A/V Shunt Intervention;  Surgeon: Katha Cabal, MD;  Location: [place] [facility];  Service: Cardiovascular;  Laterality: N/A;  . POLYPECTOMY  09/24/2015   Procedure: POLYPECTOMY INTESTINAL;  Surgeon: Lucilla Lame, MD;  Location: [place];  Service: Endoscopy;;  cecal polyp ascending polyp   Family History:  Family History  Problem Relation Age of Onset  . Stroke Father   . Hypertension Father   . Breast cancer Other   . Colon cancer Neg Hx   . Liver disease Neg Hx    Family Psychiatric  History: Bipolar Social History:  Social History   Substance and Sexual Activity  Alcohol Use No  . Alcohol/week: 0.0 oz     Social History   Substance and Sexual Activity  Drug Use No    Social History   Socioeconomic History  . Marital status: Single    Spouse name: Not on file  . Number of children: 1  . Years of education: Not on file  . Highest education level: Not on file  Occupational History  . Occupation: Surveyor, quantity: [employer]  Social Needs  . Financial resource strain: Not on file  . Food insecurity:    Worry: Not on file    Inability: Not on file  . Transportation needs:    Medical: Not on file    Non-medical: Not on file  Tobacco Use  . Smoking status: Never Smoker  . Smokeless tobacco: Never Used  Substance and Sexual Activity  . Alcohol use: No    Alcohol/week: 0.0 oz  . Drug use: No  . Sexual activity: Not Currently  Lifestyle  . Physical activity:    Days per week: Not on file    Minutes per session: Not on file  . Stress: Not on file  Relationships  . Social connections:    Talks on  phone: Not on file    Gets together: Not on file    Attends religious service: Not on file    Active member of club or organization: Not on file    Attends meetings of clubs or organizations: Not on file    Relationship status: Not on file  Other Topics Concern  . 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    Hospital Course: Patient was admitted to the psychiatry ward.  She was treated with medication but was difficult to manage and showed little improvement.  Became more and more withdrawn needing a one-to-one sitter hardly ever moving hardly eating.  Patient would not cooperate with dialysis but was cognitively impaired to where it was impossible to have a conversation with her.  Treatment team ultimately decided to consider ECT.  I  took over the patient's care and began ECT treatment with the consent of her sister.  Patient had several bilateral ECT treatments and showed a great deal of improvement by the time of discharge.  Became much more animated and lucid and active.  Became fully cooperative such that dialysis could be performed.  Became ambulatory and was eating much better.  Patient was discharged to stay with her extended family and follow-up with Dr. Thurmond Butts.  Physical Findings: AIMS: Facial and Oral Movements Muscles of Facial Expression: None, normal Lips and Perioral Area: None, normal Jaw: None, normal Tongue: None, normal,Extremity Movements Upper (arms, wrists, hands, fingers): None, normal Lower (legs, knees, ankles, toes): None, normal, Trunk Movements Neck, shoulders, hips: None, normal, Overall Severity Severity of abnormal movements (highest score from questions above): None, normal Incapacitation due to abnormal movements: None, normal Patient's awareness of abnormal movements (rate only patient's report): No Awareness, Dental Status Current problems with teeth and/or dentures?:  No Does patient usually wear dentures?: No  CIWA:    COWS:     Musculoskeletal: Strength & Muscle Tone: decreased Gait & Station: unsteady Patient leans: N/A  Psychiatric Specialty Exam: Physical Exam  Nursing note and vitals reviewed. Constitutional: She appears well-developed and well-nourished.  HENT:  Head: Normocephalic and atraumatic.  Eyes: Pupils are equal, round, and reactive to light. Conjunctivae are normal.  Neck: Normal range of motion.  Cardiovascular: Regular rhythm and normal heart sounds.  Respiratory: Effort normal. No respiratory distress.  GI: Soft.  Musculoskeletal: Normal range of motion.  Neurological: She is alert.  Skin: Skin is warm and dry.  Psychiatric: Judgment normal. Her affect is blunt. Her speech is delayed. She is slowed. Thought content is not paranoid. She expresses no homicidal and no suicidal ideation. She exhibits abnormal recent memory.    Review of Systems  Constitutional: Negative.   HENT: Negative.   Eyes: Negative.   Respiratory: Negative.   Cardiovascular: Negative.   Gastrointestinal: Negative.   Musculoskeletal: Negative.   Skin: Negative.   Neurological: Negative.   Psychiatric/Behavioral: Positive for memory loss. Negative for depression, hallucinations, substance abuse and suicidal ideas. The patient is not nervous/anxious and does not have insomnia.     Blood pressure 98/60, pulse 100, temperature (!) 97.5 F (36.4 C), resp. rate 18, height 5\' 3"  (1.6 m), weight 71.7 kg (158 lb), SpO2 100 %.Body mass index is 27.99 kg/m.  General Appearance: Fairly Groomed  Eye Contact:  Fair  Speech:  Clear and Coherent  Volume:  Normal  Mood:  Euthymic  Affect:  Constricted  Thought Process:  Goal Directed  Orientation:  Full (Time, Place, and Person)  Thought Content:  Logical  Suicidal Thoughts:  No  Homicidal Thoughts:  No  Memory:  Immediate;   Fair Recent;   Fair Remote;   Fair  Judgement:  Fair  Insight:  Fair   Psychomotor Activity:  Decreased  Concentration:  Concentration: Fair  Recall:  [employer] of Knowledge:  Fair  Language:  Fair  Akathisia:  No  Handed:  Right  AIMS (if indicated):     Assets:  Desire for Improvement Housing Resilience Social Support  ADL's:  Intact  Cognition:  Impaired,  Mild  Sleep:  Number of Hours: 4.5     Have you used any form of tobacco in the last 30 days? (Cigarettes, Smokeless Tobacco, Cigars, and/or Pipes): No  Has this patient used any form of tobacco in the last 30 days? (Cigarettes, Smokeless Tobacco, Cigars, and/or Pipes)  Yes, No  Blood Alcohol level:  Lab Results  Component Value Date   ETH <10 40/98/1191    Metabolic Disorder Labs:  Lab Results  Component Value Date   HGBA1C 4.8 09/18/2017   MPG 91.06 09/18/2017   No results found for: PROLACTIN Lab Results  Component Value Date   CHOL 251 (H) 09/18/2017   TRIG 124 09/18/2017   HDL 66 09/18/2017   CHOLHDL 3.8 09/18/2017   VLDL 25 09/18/2017   LDLCALC 160 (H) 09/18/2017   LDLCALC 43 05/14/2014    See Psychiatric Specialty Exam and Suicide Risk Assessment completed by Attending Physician prior to discharge.  Discharge destination:  Home  Is patient on multiple antipsychotic therapies at discharge:  No   Has Patient had three or more failed trials of antipsychotic monotherapy by history:  No  Recommended Plan for Multiple Antipsychotic Therapies: NA  Discharge Instructions    Diet - low sodium heart healthy   Complete by:  As directed    Increase activity slowly   Complete by:  As directed      Allergies as of 10/22/2017      Reactions   Morphine And Related Shortness Of Breath   Pt reports chest pain and difficulty breathing.   Indomethacin Hives   Pollen Extract Other (See Comments)   Sinus problems and HA      Medication List    STOP taking these medications   ARIPiprazole 2 MG tablet Commonly known as:  ABILIFY   bisacodyl 5 MG EC tablet Commonly known  as:  DULCOLAX   cephALEXin 500 MG capsule Commonly known as:  KEFLEX   pramipexole 0.125 MG tablet Commonly known as:  MIRAPEX   QUEtiapine 25 MG tablet Commonly known as:  SEROQUEL     TAKE these medications     Indication  acetaminophen 325 MG tablet Commonly known as:  TYLENOL Take 325 mg 2 (two) times daily as needed by mouth for moderate pain or headache.  Indication:  Pain   brimonidine 0.2 % ophthalmic solution Commonly known as:  ALPHAGAN Place 1 drop into both eyes 2 (two) times daily. What changed:  how much to take  Indication:  Wide-Angle Glaucoma   carbamazepine 200 MG tablet Commonly known as:  TEGRETOL Take 1 tablet (200 mg total) by mouth 2 (two) times daily.  Indication:  Manic-Depression   cholecalciferol 1000 units tablet Commonly known as:  VITAMIN D Take 1,000 Units by mouth daily.  Indication:  general health   cyanocobalamin 1000 MCG/ML injection Commonly known as:  (VITAMIN B-12) Inject 1,000 mcg into the muscle every 30 (thirty) days.  Indication:  Inadequate Vitamin B12   dicyclomine 20 MG tablet Commonly known as:  BENTYL Take 1 tablet (20 mg total) by mouth 3 (three) times daily before meals.  Indication:  Irritable Bowel Syndrome   epoetin alfa 4000 UNIT/ML injection Commonly known as:  EPOGEN,PROCRIT Inject 1 mL (4,000 Units total) into the vein every Monday, Wednesday, and Friday with hemodialysis.  Indication:  Anemia due to Kidney Failure   famotidine 20 MG tablet Commonly known as:  PEPCID Take 1 tablet (20 mg total) by mouth daily.  Indication:  Gastroesophageal Reflux Disease   lidocaine 5 % Commonly known as:  LIDODERM Place 1 patch onto the skin daily. Remove & Discard patch within 12 hours or as directed by MD  Indication:  pain   lidocaine-prilocaine cream Commonly known as:  EMLA Apply 1 application every Monday, Wednesday, and Friday topically.  At dialysis  Indication:  Anesthesia to a Specific Part of the Body    LORazepam 2 MG tablet Commonly known as:  ATIVAN Take 1 tablet (2 mg total) by mouth at bedtime.  Indication:  Anxiousness associated with Depression, Trouble Sleeping due to Feeling Anxious   metoprolol tartrate 25 MG tablet Commonly known as:  LOPRESSOR Take 0.5 tablets (12.5 mg total) by mouth 2 (two) times daily.  Indication:  High Blood Pressure Disorder   midodrine 10 MG tablet Commonly known as:  PROAMATINE Take 1 tablet (10 mg total) by mouth daily.  Indication:  Acute Renal Failure in Patients with Severe Liver Disease   OLANZapine 15 MG tablet Commonly known as:  ZYPREXA Take 1 tablet (15 mg total) by mouth at bedtime.  Indication:  Manic-Depression   pantoprazole 40 MG tablet Commonly known as:  PROTONIX Take 1 tablet (40 mg total) by mouth daily.  Indication:  Gastroesophageal Reflux Disease   rOPINIRole 2 MG tablet Commonly known as:  REQUIP Take 1 tablet (2 mg total) by mouth at bedtime.  Indication:  Restless Leg Syndrome   timolol 0.5 % ophthalmic solution Commonly known as:  TIMOPTIC Place 1 drop into both eyes 2 (two) times daily.  Indication:  Wide-Angle Glaucoma      Follow-up Information    James Ivanoff MD. Go on 10/28/2017.   Why:  Please attend your follow up appointment on Wednesday 10/28/2017 at 4:30PM. Thank you. Contact information: Address: [id number], [place],  [address] Phone: [phone] Fax: [phone]          Follow-up recommendations:  Activity:  Activity as tolerated Diet:  Heart healthy diet Other:  Recommend follow-up maintenance ECT as well as medication management  Comments : Patient was discharged with the plan that she would be staying with her son or sister and would follow up with Dr. Thurmond Butts and also agreed to follow up with maintenance ECT  Signed: Alethia Berthold, MD 11/10/2017, 6:31 PM

## 2017-11-11 DIAGNOSIS — N186 End stage renal disease: Secondary | ICD-10-CM | POA: Diagnosis not present

## 2017-11-11 DIAGNOSIS — Z992 Dependence on renal dialysis: Secondary | ICD-10-CM | POA: Diagnosis not present

## 2017-11-13 ENCOUNTER — Other Ambulatory Visit: Payer: Self-pay | Admitting: Psychiatry

## 2017-11-13 DIAGNOSIS — N186 End stage renal disease: Secondary | ICD-10-CM | POA: Diagnosis not present

## 2017-11-13 DIAGNOSIS — Z992 Dependence on renal dialysis: Secondary | ICD-10-CM | POA: Diagnosis not present

## 2017-11-16 DIAGNOSIS — N186 End stage renal disease: Secondary | ICD-10-CM | POA: Diagnosis not present

## 2017-11-16 DIAGNOSIS — Z992 Dependence on renal dialysis: Secondary | ICD-10-CM | POA: Diagnosis not present

## 2017-11-17 DIAGNOSIS — N186 End stage renal disease: Secondary | ICD-10-CM | POA: Diagnosis not present

## 2017-11-18 DIAGNOSIS — N186 End stage renal disease: Secondary | ICD-10-CM | POA: Diagnosis not present

## 2017-11-18 DIAGNOSIS — Z992 Dependence on renal dialysis: Secondary | ICD-10-CM | POA: Diagnosis not present

## 2017-11-20 DIAGNOSIS — Z992 Dependence on renal dialysis: Secondary | ICD-10-CM | POA: Diagnosis not present

## 2017-11-20 DIAGNOSIS — N186 End stage renal disease: Secondary | ICD-10-CM | POA: Diagnosis not present

## 2017-11-23 ENCOUNTER — Other Ambulatory Visit: Payer: Self-pay | Admitting: Gastroenterology

## 2017-11-23 DIAGNOSIS — N186 End stage renal disease: Secondary | ICD-10-CM | POA: Diagnosis not present

## 2017-11-23 DIAGNOSIS — Z992 Dependence on renal dialysis: Secondary | ICD-10-CM | POA: Diagnosis not present

## 2017-11-25 DIAGNOSIS — Z992 Dependence on renal dialysis: Secondary | ICD-10-CM | POA: Diagnosis not present

## 2017-11-25 DIAGNOSIS — N186 End stage renal disease: Secondary | ICD-10-CM | POA: Diagnosis not present

## 2017-11-27 DIAGNOSIS — N186 End stage renal disease: Secondary | ICD-10-CM | POA: Diagnosis not present

## 2017-11-27 DIAGNOSIS — Z992 Dependence on renal dialysis: Secondary | ICD-10-CM | POA: Diagnosis not present

## 2017-11-30 ENCOUNTER — Telehealth: Payer: Self-pay | Admitting: Gastroenterology

## 2017-11-30 DIAGNOSIS — Z992 Dependence on renal dialysis: Secondary | ICD-10-CM | POA: Diagnosis not present

## 2017-11-30 DIAGNOSIS — N186 End stage renal disease: Secondary | ICD-10-CM | POA: Diagnosis not present

## 2017-11-30 NOTE — Telephone Encounter (Signed)
Patient called and needs for you to fax a RX for Panatoprazole 40 mg to [facility].

## 2017-12-02 DIAGNOSIS — N186 End stage renal disease: Secondary | ICD-10-CM | POA: Diagnosis not present

## 2017-12-02 DIAGNOSIS — Z992 Dependence on renal dialysis: Secondary | ICD-10-CM | POA: Diagnosis not present

## 2017-12-04 DIAGNOSIS — Z992 Dependence on renal dialysis: Secondary | ICD-10-CM | POA: Diagnosis not present

## 2017-12-04 DIAGNOSIS — N186 End stage renal disease: Secondary | ICD-10-CM | POA: Diagnosis not present

## 2017-12-07 DIAGNOSIS — Z992 Dependence on renal dialysis: Secondary | ICD-10-CM | POA: Diagnosis not present

## 2017-12-07 DIAGNOSIS — N186 End stage renal disease: Secondary | ICD-10-CM | POA: Diagnosis not present

## 2017-12-09 ENCOUNTER — Other Ambulatory Visit: Payer: Self-pay | Admitting: Psychiatry

## 2017-12-09 DIAGNOSIS — N186 End stage renal disease: Secondary | ICD-10-CM | POA: Diagnosis not present

## 2017-12-09 DIAGNOSIS — Z992 Dependence on renal dialysis: Secondary | ICD-10-CM | POA: Diagnosis not present

## 2017-12-10 DIAGNOSIS — R631 Polydipsia: Secondary | ICD-10-CM | POA: Diagnosis not present

## 2017-12-10 DIAGNOSIS — Z8659 Personal history of other mental and behavioral disorders: Secondary | ICD-10-CM | POA: Diagnosis not present

## 2017-12-10 DIAGNOSIS — E669 Obesity, unspecified: Secondary | ICD-10-CM | POA: Diagnosis not present

## 2017-12-10 DIAGNOSIS — K5732 Diverticulitis of large intestine without perforation or abscess without bleeding: Secondary | ICD-10-CM | POA: Diagnosis not present

## 2017-12-11 DIAGNOSIS — Z992 Dependence on renal dialysis: Secondary | ICD-10-CM | POA: Diagnosis not present

## 2017-12-11 DIAGNOSIS — N186 End stage renal disease: Secondary | ICD-10-CM | POA: Diagnosis not present

## 2017-12-14 ENCOUNTER — Other Ambulatory Visit: Payer: Self-pay

## 2017-12-14 ENCOUNTER — Encounter: Payer: Self-pay | Admitting: Emergency Medicine

## 2017-12-14 ENCOUNTER — Other Ambulatory Visit: Payer: Self-pay | Admitting: Psychiatry

## 2017-12-14 ENCOUNTER — Emergency Department
Admission: EM | Admit: 2017-12-14 | Discharge: 2017-12-15 | Disposition: A | Discharge status: Other Acute Inpatient Hospital | Payer: Medicare HMO | Attending: Emergency Medicine | Admitting: Emergency Medicine

## 2017-12-14 DIAGNOSIS — D122 Benign neoplasm of ascending colon: Secondary | ICD-10-CM | POA: Insufficient documentation

## 2017-12-14 DIAGNOSIS — Z79899 Other long term (current) drug therapy: Secondary | ICD-10-CM | POA: Insufficient documentation

## 2017-12-14 DIAGNOSIS — I120 Hypertensive chronic kidney disease with stage 5 chronic kidney disease or end stage renal disease: Secondary | ICD-10-CM | POA: Diagnosis not present

## 2017-12-14 DIAGNOSIS — F419 Anxiety disorder, unspecified: Secondary | ICD-10-CM | POA: Insufficient documentation

## 2017-12-14 DIAGNOSIS — F3110 Bipolar disorder, current episode manic without psychotic features, unspecified: Secondary | ICD-10-CM | POA: Diagnosis not present

## 2017-12-14 DIAGNOSIS — F329 Major depressive disorder, single episode, unspecified: Secondary | ICD-10-CM | POA: Diagnosis present

## 2017-12-14 DIAGNOSIS — Z046 Encounter for general psychiatric examination, requested by authority: Secondary | ICD-10-CM | POA: Diagnosis not present

## 2017-12-14 DIAGNOSIS — N186 End stage renal disease: Secondary | ICD-10-CM

## 2017-12-14 DIAGNOSIS — Z992 Dependence on renal dialysis: Secondary | ICD-10-CM | POA: Insufficient documentation

## 2017-12-14 DIAGNOSIS — F319 Bipolar disorder, unspecified: Secondary | ICD-10-CM | POA: Diagnosis present

## 2017-12-14 DIAGNOSIS — I10 Essential (primary) hypertension: Secondary | ICD-10-CM | POA: Diagnosis present

## 2017-12-14 DIAGNOSIS — D120 Benign neoplasm of cecum: Secondary | ICD-10-CM | POA: Insufficient documentation

## 2017-12-14 DIAGNOSIS — R0989 Other specified symptoms and signs involving the circulatory and respiratory systems: Secondary | ICD-10-CM | POA: Diagnosis not present

## 2017-12-14 DIAGNOSIS — N390 Urinary tract infection, site not specified: Secondary | ICD-10-CM

## 2017-12-14 DIAGNOSIS — F331 Major depressive disorder, recurrent, moderate: Secondary | ICD-10-CM

## 2017-12-14 LAB — CBC WITH DIFFERENTIAL/PLATELET
BASOS ABS: 0 10*3/uL (ref 0–0.1)
Basophils Relative: 1 %
EOS ABS: 0.1 10*3/uL (ref 0–0.7)
EOS PCT: 1 %
HCT: 34.8 % — ABNORMAL LOW (ref 35.0–47.0)
HEMOGLOBIN: 11.8 g/dL — AB (ref 12.0–16.0)
LYMPHS ABS: 1.1 10*3/uL (ref 1.0–3.6)
LYMPHS PCT: 14 %
MCH: 34.5 pg — AB (ref 26.0–34.0)
MCHC: 33.9 g/dL (ref 32.0–36.0)
MCV: 101.8 fL — ABNORMAL HIGH (ref 80.0–100.0)
Monocytes Absolute: 0.5 10*3/uL (ref 0.2–0.9)
Monocytes Relative: 7 %
NEUTROS PCT: 77 %
Neutro Abs: 6.2 10*3/uL (ref 1.4–6.5)
PLATELETS: 252 10*3/uL (ref 150–440)
RBC: 3.42 MIL/uL — AB (ref 3.80–5.20)
RDW: 16.1 % — ABNORMAL HIGH (ref 11.5–14.5)
WBC: 7.9 10*3/uL (ref 3.6–11.0)

## 2017-12-14 NOTE — ED Provider Notes (Signed)
[facility] Emergency Department Provider Note   ____________________________________________   First MD Initiated Contact with Patient 12/14/17 2336     (approximate)  I have reviewed the triage vital signs and the nursing notes.   HISTORY  Chief Complaint Psychiatric Evaluation    HPI Joyce [name] is a 72 y.o. female who presents to the ED from home requesting behavioral medicine evaluation by Dr. Cora Collum.  Patient has a history of depression and anxiety with psychiatric hospitalization at this facility this spring.  Also receive ECT by Dr. Cora Collum.  Thinks she might need her medications adjusted because she is "too emotional" and has the "jitters".  Denies active SI/HI/AH/VH.  Voices no medical complaints.  Noted patient has ESRD on HD M/W/F; states she received dialysis today.   Past Medical History:  Diagnosis Date  . Anal fissure   . Bipolar affective disorder ([place])   . CKD (chronic kidney disease)    Dr Holley Raring [name] 4  . Colon polyps   . Diverticulitis   . Diverticulitis   . Family history of adverse reaction to anesthesia    mom - PONV  . GERD (gastroesophageal reflux disease)   . Headache    migraines - none over 10 yrs  . Heart murmur   . History of hiatal hernia   . Hypertension   . Pancreatitis    Valproic acid  . Vertigo     Patient Active Problem List   Diagnosis Date Noted  . Palliative care encounter   . Bipolar affective disorder, current episode depressed with psychotic symptoms ([place]) 09/17/2017  . Bipolar I disorder, most recent episode (or current) manic ([place]) 09/02/2017  . Acute delirium 09/02/2017  . Altered mental status   . Acute encephalopathy 09/01/2017  . ESRD on dialysis ([place]) 04/06/2017  . Complication of vascular access for dialysis 04/06/2017  . Hematuria 12/18/2016  . Polycystic kidney 10/12/2016  . Ruptured cyst of kidney 10/03/2016  . HTN (hypertension) 10/01/2016  . Chronic kidney disease  10/01/2016  . Anemia associated with chronic renal failure 09/24/2016  . Acute kidney insufficiency 07/28/2016  . Noninfectious diarrhea   . Benign neoplasm of cecum   . Benign neoplasm of ascending colon   . Diarrhea   . Nausea   . Gastric polyp   . Chronic constipation 12/27/2014  . Nausea with vomiting 12/27/2014  . Left sided abdominal pain 12/27/2014  . Personal history of colonic polyps 05/10/2013    Past Surgical History:  Procedure Laterality Date  . A/V FISTULAGRAM Left 04/14/2017   Procedure: A/V Fistulagram;  Surgeon: Katha Cabal, MD;  Location: [place] [facility];  Service: Cardiovascular;  Laterality: Left;  . A/V FISTULAGRAM Left 06/09/2017   Procedure: A/V FISTULAGRAM;  Surgeon: Katha Cabal, MD;  Location: [place] [facility];  Service: Cardiovascular;  Laterality: Left;  . A/V FISTULAGRAM Left 11/02/2017   Procedure: A/V FISTULAGRAM;  Surgeon: Algernon Huxley, MD;  Location: [place] [facility];  Service: Cardiovascular;  Laterality: Left;  . A/V SHUNT INTERVENTION N/A 11/02/2017   Procedure: A/V SHUNT INTERVENTION;  Surgeon: Algernon Huxley, MD;  Location: [place] [facility];  Service: Cardiovascular;  Laterality: N/A;  . ABDOMINAL HYSTERECTOMY  1990 ?  . AV FISTULA PLACEMENT  4/30  . BREAST EXCISIONAL BIOPSY Left 1994   neg surgical bx  . CHOLECYSTECTOMY  2003  . COLONOSCOPY  2014   Dr. Jamal Collin  . COLONOSCOPY WITH PROPOFOL N/A 09/24/2015   Procedure:  COLONOSCOPY WITH random colon byopies.;  Surgeon: Lucilla Lame, MD;  Location: [place];  Service: Endoscopy;  Laterality: N/A;  . ESOPHAGOGASTRODUODENOSCOPY (EGD) WITH PROPOFOL N/A 09/24/2015   Procedure: ESOPHAGOGASTRODUODENOSCOPY (EGD) ;  Surgeon: Lucilla Lame, MD;  Location: [place];  Service: Endoscopy;  Laterality: N/A;  . EYE SURGERY    . PERIPHERAL VASCULAR CATHETERIZATION N/A 05/29/2015   Procedure: A/V Shuntogram/Fistulagram;  Surgeon: Katha Cabal, MD;  Location: [place] [facility];  Service: Cardiovascular;  Laterality: N/A;  . PERIPHERAL VASCULAR CATHETERIZATION N/A 05/29/2015   Procedure: A/V Shunt Intervention;  Surgeon: Katha Cabal, MD;  Location: [place] [facility];  Service: Cardiovascular;  Laterality: N/A;  . POLYPECTOMY  09/24/2015   Procedure: POLYPECTOMY INTESTINAL;  Surgeon: Lucilla Lame, MD;  Location: [place];  Service: Endoscopy;;  cecal polyp ascending polyp    Prior to Admission medications   Medication Sig Start Date End Date Taking? Authorizing Provider  dicyclomine (BENTYL) 20 MG tablet Take 1 tablet (20 mg total) by mouth 3 (three) times daily before meals. Patient taking differently: Take 20 mg by mouth daily.  10/22/17  Yes Clapacs, [name], MD  famotidine (PEPCID) 20 MG tablet Take 1 tablet (20 mg total) by mouth daily. 10/23/17  Yes Clapacs, [name], MD  haloperidol (HALDOL) 5 MG tablet TAKE 1/2 TABLET BY MOUTH IN THE MORNING AND 1 TABLET AT BEDTIME 11/16/17  Yes [provider]  lidocaine-prilocaine (EMLA) cream Apply 1 application every Monday, Wednesday, and Friday topically. At dialysis 05/06/17  Yes [provider]  LORazepam (ATIVAN) 2 MG tablet Take 1 tablet (2 mg total) by mouth at bedtime. 10/22/17  Yes Clapacs, [name], MD  metoprolol tartrate (LOPRESSOR) 25 MG tablet Take 0.5 tablets (12.5 mg total) by mouth 2 (two) times daily. Patient taking differently: Take 25 mg by mouth 2 (two) times daily.  10/22/17  Yes Clapacs, [name], MD  midodrine (PROAMATINE) 10 MG tablet Take 1 tablet (10 mg total) by mouth daily. 10/22/17  Yes Clapacs, [name], MD  multivitamin (RENA-VIT) TABS tablet TAKE 1 TABLET BY MOUTH EVERYDAY AT BEDTIME 10/27/17  Yes Clapacs, [name], MD  OLANZapine (ZYPREXA) 15 MG tablet Take 1 tablet (15 mg total) by mouth at bedtime. 10/22/17  Yes Clapacs, [name], MD  rOPINIRole (REQUIP) 2 MG tablet Take 1 tablet (2 mg total) by mouth at bedtime. Patient taking differently: Take 2 mg by mouth daily.   10/22/17  Yes Clapacs, [name], MD  acetaminophen (TYLENOL) 325 MG tablet Take 325 mg 2 (two) times daily as needed by mouth for moderate pain or headache.     [provider]  brimonidine (ALPHAGAN) 0.2 % ophthalmic solution Place 1 drop into both eyes 2 (two) times daily. 10/22/17   Clapacs, [name], MD  carbamazepine (TEGRETOL) 200 MG tablet Take 1 tablet (200 mg total) by mouth 2 (two) times daily. 10/22/17   Clapacs, [name], MD  cholecalciferol (VITAMIN D) 1000 UNITS tablet Take 1,000 Units by mouth daily.    [provider]  cyanocobalamin (,VITAMIN B-12,) 1000 MCG/ML injection Inject 1,000 mcg into the muscle every 30 (thirty) days.    [provider]  epoetin alfa (EPOGEN,PROCRIT) 4000 UNIT/ML injection Inject 1 mL (4,000 Units total) into the vein every Monday, Wednesday, and Friday with hemodialysis. 10/22/17   Clapacs, [name], MD  lidocaine (LIDODERM) 5 % Place 1 patch onto the skin daily. Remove & Discard patch within 12 hours  or as directed by MD 10/22/17   Clapacs, [name], MD  pantoprazole (PROTONIX) 40 MG tablet TAKE 1 TABLET EVERY DAY 12/01/17   Lucilla Lame, MD  timolol (TIMOPTIC) 0.5 % ophthalmic solution Place 1 drop into both eyes 2 (two) times daily. 10/22/17   Clapacs, [name], MD    Allergies Morphine and related; Indomethacin; and Pollen extract  Family History  Problem Relation Age of Onset  . Stroke Father   . Hypertension Father   . Breast cancer Other   . Colon cancer Neg Hx   . Liver disease Neg Hx     Social History Social History   Tobacco Use  . Smoking status: Never Smoker  . Smokeless tobacco: Never Used  Substance Use Topics  . Alcohol use: No    Alcohol/week: 0.0 oz  . Drug use: No    Review of Systems  Constitutional: No fever/chills Eyes: No visual changes. ENT: No sore throat. Cardiovascular: Denies chest pain. Respiratory: Denies shortness of breath. Gastrointestinal: No abdominal pain.  No nausea, no vomiting.  No  diarrhea.  No constipation. Genitourinary: Negative for dysuria. Musculoskeletal: Negative for back pain. Skin: Negative for rash. Neurological: Negative for headaches, focal weakness or numbness. Psychiatric: Positive for depression and anxiety.  ____________________________________________   PHYSICAL EXAM:  VITAL SIGNS: ED Triage Vitals  Enc Vitals Group     BP 12/14/17 2237 (!) 176/51     Pulse Rate 12/14/17 2237 80     Resp 12/14/17 2237 18     Temp 12/14/17 2237 98.7 F (37.1 C)     Temp Source 12/14/17 2237 Oral     SpO2 12/14/17 2237 96 %     Weight 12/14/17 2239 165 lb (74.8 kg)     Height 12/14/17 2239 5\' 3"  (1.6 m)     Head Circumference --      Peak Flow --      Pain Score 12/14/17 2250 0     Pain Loc --      Pain Edu? --      Excl. in [place]? --     Constitutional: Alert and oriented. Well appearing and in no acute distress. Eyes: Conjunctivae are normal. PERRL. EOMI. Head: Atraumatic. Nose: No congestion/rhinnorhea. Mouth/Throat: Mucous membranes are moist.  Oropharynx non-erythematous. Neck: No stridor.   Cardiovascular: Normal rate, regular rhythm. Grossly normal heart sounds.  Good peripheral circulation. Respiratory: Normal respiratory effort.  No retractions. Lungs CTAB. Gastrointestinal: Soft and nontender. No distention. No abdominal bruits. No CVA tenderness. Musculoskeletal: No lower extremity tenderness nor edema.  No joint effusions. Neurologic:  Normal speech and language. No gross focal neurologic deficits are appreciated. No gait instability. Skin:  Skin is warm, dry and intact. No rash noted. Psychiatric: Mood and affect are flat. Speech and behavior are normal.  ____________________________________________   LABS (all labs ordered are listed, but only abnormal results are displayed)  Labs Reviewed  COMPREHENSIVE METABOLIC PANEL - Abnormal; Notable for the following components:      Result Value   Potassium 3.3 (*)    Chloride 98 (*)     Glucose, Bld 119 (*)    Creatinine, Ser 3.03 (*)    Calcium 8.8 (*)    ALT 12 (*)    GFR calc non Af Amer 14 (*)    GFR calc Af Amer 17 (*)    All other components within normal limits  CBC WITH DIFFERENTIAL/PLATELET - Abnormal; Notable for the following components:   RBC 3.42 (*)  Hemoglobin 11.8 (*)    HCT 34.8 (*)    MCV 101.8 (*)    MCH 34.5 (*)    RDW 16.1 (*)    All other components within normal limits  ACETAMINOPHEN LEVEL - Abnormal; Notable for the following components:   Acetaminophen (Tylenol), Serum <10 (*)    All other components within normal limits  URINALYSIS, COMPLETE (UACMP) WITH MICROSCOPIC - Abnormal; Notable for the following components:   Color, Urine YELLOW (*)    APPearance CLEAR (*)    Leukocytes, UA MODERATE (*)    All other components within normal limits  ETHANOL  URINE DRUG SCREEN, QUALITATIVE ([facility] ONLY)  SALICYLATE LEVEL  TSH   ____________________________________________  EKG  ED ECG REPORT I, Janelle Culton J, the attending physician, personally viewed and interpreted this ECG.   Date: 12/15/2017  EKG Time: 0541  Rate: 82  Rhythm: normal EKG, normal sinus rhythm  Axis: LAD  Intervals:none  ST&T Change: Nonspecific  ____________________________________________  RADIOLOGY  ED MD interpretation: No pneumonia  Official radiology report(s): Dg Chest Port 1 View  Result Date: 12/15/2017 CLINICAL DATA:  Behavioral medicine evaluation. Patient on dialysis. EXAM: PORTABLE CHEST 1 VIEW COMPARISON:  Chest radiograph performed 09/27/2017 FINDINGS: The lungs are relatively well-aerated. Pulmonary vascularity is at the upper limits of normal. There is no evidence of focal opacification, pleural effusion or pneumothorax. The cardiomediastinal silhouette is within normal limits. No acute osseous abnormalities are seen. A left-sided vascular stent is noted extending to the left axilla. IMPRESSION: No acute cardiopulmonary process seen. Electronically  Signed   By: Garald Balding M.D.   On: 12/15/2017 00:55    ____________________________________________   PROCEDURES  Procedure(s) performed: None  Procedures  Critical Care performed: No  ____________________________________________   INITIAL IMPRESSION / ASSESSMENT AND PLAN / ED COURSE  As part of my medical decision making, I reviewed the following data within the [facility] notes reviewed and incorporated, Labs reviewed, EKG interpreted, Old chart reviewed, Radiograph reviewed, A consult was requested and obtained from this/these consultant(s) Psychiatry and Notes from prior ED visits   72 year old female with ESRD on HD M/W/F, also with depression with history of ECT who presents requesting behavioral medicine evaluation by Dr. Cora Collum.  Will obtain screening toxicological lab work and urine, EKG, chest x-ray.  Patient contracts for safety while in the emergency department.  Will remain in the ED under voluntary status pending psychiatric evaluation in the morning.   Clinical Course as of Dec 15 620  Tue Dec 15, 2017  0452 Will order Keflex for UTI.  Noted kidney function improved from prior. Will need to be dialyzed Wednesday.   [JS]    Clinical Course User Index [JS] Paulette Blanch, MD     ____________________________________________   FINAL CLINICAL IMPRESSION(S) / ED DIAGNOSES  Final diagnoses:  Moderate episode of recurrent major depressive disorder (HCC)  Urinary tract infection without hematuria, site unspecified     ED Discharge Orders    None       Note:  This document was prepared using Dragon voice recognition software and may include unintentional dictation errors.    Paulette Blanch, MD 12/15/17 [phone]

## 2017-12-14 NOTE — ED Triage Notes (Addendum)
Pt presents to ED from home with c/o possibly needing her medications adjusted. pt states she is "too emotional" and has the "jitters". D/c from our behav. Unit march 4 and has been living with family since then. Pt denies wanting to or recently attempting to hurt herself. Has been going to her out-pt therapy as directed. Pt calm and answering questions.

## 2017-12-14 NOTE — ED Notes (Addendum)
Patient dressed out into purple behavioral scrubs by this EDT.  Items in bag include purple shirt, bra, grey leggings, grey jacket and flip flops. Patients cell phone also in patient belonging bag that she would like to have locked up. Patient refusing mesh white panties. Patient also has bag full of clothing items,  Jeans, pajamas, panties, house coat, and hair brush.

## 2017-12-15 ENCOUNTER — Inpatient Hospital Stay
Admission: AD | Admit: 2017-12-15 | Discharge: 2017-12-23 | DRG: 885 | Disposition: A | Discharge status: Home / Self Care | Payer: Medicare HMO | Source: Intra-hospital | Attending: Psychiatry | Admitting: Psychiatry

## 2017-12-15 ENCOUNTER — Emergency Department: Payer: Medicare HMO

## 2017-12-15 ENCOUNTER — Encounter: Payer: Self-pay | Admitting: Psychiatry

## 2017-12-15 DIAGNOSIS — Z992 Dependence on renal dialysis: Secondary | ICD-10-CM | POA: Diagnosis not present

## 2017-12-15 DIAGNOSIS — F331 Major depressive disorder, recurrent, moderate: Secondary | ICD-10-CM | POA: Diagnosis not present

## 2017-12-15 DIAGNOSIS — K219 Gastro-esophageal reflux disease without esophagitis: Secondary | ICD-10-CM | POA: Diagnosis present

## 2017-12-15 DIAGNOSIS — N186 End stage renal disease: Secondary | ICD-10-CM

## 2017-12-15 DIAGNOSIS — F3110 Bipolar disorder, current episode manic without psychotic features, unspecified: Principal | ICD-10-CM | POA: Diagnosis present

## 2017-12-15 DIAGNOSIS — I959 Hypotension, unspecified: Secondary | ICD-10-CM | POA: Diagnosis not present

## 2017-12-15 DIAGNOSIS — N2581 Secondary hyperparathyroidism of renal origin: Secondary | ICD-10-CM | POA: Diagnosis present

## 2017-12-15 DIAGNOSIS — F319 Bipolar disorder, unspecified: Secondary | ICD-10-CM | POA: Diagnosis present

## 2017-12-15 DIAGNOSIS — Z79899 Other long term (current) drug therapy: Secondary | ICD-10-CM | POA: Diagnosis not present

## 2017-12-15 DIAGNOSIS — G2581 Restless legs syndrome: Secondary | ICD-10-CM | POA: Diagnosis present

## 2017-12-15 DIAGNOSIS — R0989 Other specified symptoms and signs involving the circulatory and respiratory systems: Secondary | ICD-10-CM | POA: Diagnosis not present

## 2017-12-15 DIAGNOSIS — I120 Hypertensive chronic kidney disease with stage 5 chronic kidney disease or end stage renal disease: Secondary | ICD-10-CM | POA: Diagnosis not present

## 2017-12-15 DIAGNOSIS — I10 Essential (primary) hypertension: Secondary | ICD-10-CM | POA: Diagnosis present

## 2017-12-15 DIAGNOSIS — G4700 Insomnia, unspecified: Secondary | ICD-10-CM | POA: Diagnosis not present

## 2017-12-15 DIAGNOSIS — F419 Anxiety disorder, unspecified: Secondary | ICD-10-CM | POA: Diagnosis not present

## 2017-12-15 DIAGNOSIS — Z046 Encounter for general psychiatric examination, requested by authority: Secondary | ICD-10-CM | POA: Diagnosis not present

## 2017-12-15 DIAGNOSIS — D120 Benign neoplasm of cecum: Secondary | ICD-10-CM | POA: Diagnosis not present

## 2017-12-15 DIAGNOSIS — N390 Urinary tract infection, site not specified: Secondary | ICD-10-CM | POA: Diagnosis not present

## 2017-12-15 DIAGNOSIS — D631 Anemia in chronic kidney disease: Secondary | ICD-10-CM | POA: Diagnosis not present

## 2017-12-15 DIAGNOSIS — D122 Benign neoplasm of ascending colon: Secondary | ICD-10-CM | POA: Diagnosis not present

## 2017-12-15 LAB — COMPREHENSIVE METABOLIC PANEL
ALT: 12 U/L — ABNORMAL LOW (ref 14–54)
ANION GAP: 10 (ref 5–15)
AST: 24 U/L (ref 15–41)
Albumin: 3.8 g/dL (ref 3.5–5.0)
Alkaline Phosphatase: 68 U/L (ref 38–126)
BUN: 10 mg/dL (ref 6–20)
CALCIUM: 8.8 mg/dL — AB (ref 8.9–10.3)
CHLORIDE: 98 mmol/L — AB (ref 101–111)
CO2: 28 mmol/L (ref 22–32)
CREATININE: 3.03 mg/dL — AB (ref 0.44–1.00)
GFR, EST AFRICAN AMERICAN: 17 mL/min — AB (ref >60)
GFR, EST NON AFRICAN AMERICAN: 14 mL/min — AB (ref >60)
Glucose, Bld: 119 mg/dL — ABNORMAL HIGH (ref 65–99)
Potassium: 3.3 mmol/L — ABNORMAL LOW (ref 3.5–5.1)
SODIUM: 136 mmol/L (ref 135–145)
Total Bilirubin: 0.4 mg/dL (ref 0.3–1.2)
Total Protein: 6.7 g/dL (ref 6.5–8.1)

## 2017-12-15 LAB — SALICYLATE LEVEL: Salicylate Lvl: <7 mg/dL (ref 2.8–30.0)

## 2017-12-15 LAB — URINE DRUG SCREEN, QUALITATIVE (ARMC ONLY)
AMPHETAMINES, UR SCREEN: NOT DETECTED
BENZODIAZEPINE, UR SCRN: NOT DETECTED
Barbiturates, Ur Screen: NOT DETECTED
Cannabinoid 50 Ng, Ur ~~LOC~~: NOT DETECTED
Cocaine Metabolite,Ur ~~LOC~~: NOT DETECTED
MDMA (ECSTASY) UR SCREEN: NOT DETECTED
METHADONE SCREEN, URINE: NOT DETECTED
Opiate, Ur Screen: NOT DETECTED
Phencyclidine (PCP) Ur S: NOT DETECTED
Tricyclic, Ur Screen: NOT DETECTED

## 2017-12-15 LAB — URINALYSIS, COMPLETE (UACMP) WITH MICROSCOPIC
BILIRUBIN URINE: NEGATIVE
Bacteria, UA: NONE SEEN
GLUCOSE, UA: NEGATIVE mg/dL
HGB URINE DIPSTICK: NEGATIVE
Ketones, ur: NEGATIVE mg/dL
NITRITE: NEGATIVE
Protein, ur: NEGATIVE mg/dL
SPECIFIC GRAVITY, URINE: 1.006 (ref 1.005–1.030)
pH: 8 (ref 5.0–8.0)

## 2017-12-15 LAB — ETHANOL

## 2017-12-15 LAB — TSH: TSH: 2.03 u[IU]/mL (ref 0.350–4.500)

## 2017-12-15 LAB — ACETAMINOPHEN LEVEL

## 2017-12-15 MED ORDER — FAMOTIDINE 20 MG PO TABS
20.0000 mg | ORAL_TABLET | Freq: Every day | ORAL | Status: DC
  Administered 2017-12-15: 20 mg via ORAL
  Filled 2017-12-15: qty 1

## 2017-12-15 MED ORDER — ROPINIROLE HCL 1 MG PO TABS
2.0000 mg | ORAL_TABLET | Freq: Every day | ORAL | Status: DC
  Administered 2017-12-16 – 2017-12-17 (×2): 2 mg via ORAL
  Filled 2017-12-15 (×2): qty 2

## 2017-12-15 MED ORDER — ROPINIROLE HCL 1 MG PO TABS
2.0000 mg | ORAL_TABLET | Freq: Every day | ORAL | Status: DC
  Administered 2017-12-15: 2 mg via ORAL

## 2017-12-15 MED ORDER — OLANZAPINE 5 MG PO TABS
15.0000 mg | ORAL_TABLET | Freq: Every day | ORAL | Status: DC
  Administered 2017-12-16 – 2017-12-22 (×7): 15 mg via ORAL
  Filled 2017-12-15 (×7): qty 1

## 2017-12-15 MED ORDER — MIDODRINE HCL 5 MG PO TABS
10.0000 mg | ORAL_TABLET | Freq: Every day | ORAL | Status: DC
  Administered 2017-12-16 – 2017-12-23 (×8): 10 mg via ORAL
  Filled 2017-12-15 (×9): qty 2

## 2017-12-15 MED ORDER — METOPROLOL TARTRATE 25 MG PO TABS
25.0000 mg | ORAL_TABLET | Freq: Two times a day (BID) | ORAL | Status: DC
  Administered 2017-12-15: 25 mg via ORAL
  Filled 2017-12-15: qty 1

## 2017-12-15 MED ORDER — CEPHALEXIN 250 MG PO CAPS
250.0000 mg | ORAL_CAPSULE | Freq: Three times a day (TID) | ORAL | Status: DC
  Administered 2017-12-15 (×2): 250 mg via ORAL
  Filled 2017-12-15 (×6): qty 1

## 2017-12-15 MED ORDER — SODIUM CHLORIDE 0.9 % IV SOLN: 500.0000 mL | Freq: Once | INTRAVENOUS | Status: DC

## 2017-12-15 MED ORDER — ACETAMINOPHEN 325 MG PO TABS
650.0000 mg | ORAL_TABLET | Freq: Once | ORAL | Status: AC
Start: 2017-12-15 — End: 2017-12-15
  Administered 2017-12-15: 650 mg via ORAL
  Filled 2017-12-15: qty 2

## 2017-12-15 MED ORDER — HALOPERIDOL 1 MG PO TABS
2.0000 mg | ORAL_TABLET | Freq: Two times a day (BID) | ORAL | Status: DC
  Administered 2017-12-15 – 2017-12-17 (×4): 2 mg via ORAL
  Filled 2017-12-15 (×5): qty 2

## 2017-12-15 MED ORDER — MIDODRINE HCL 5 MG PO TABS
10.0000 mg | ORAL_TABLET | Freq: Every day | ORAL | Status: DC
  Administered 2017-12-15: 10 mg via ORAL
  Filled 2017-12-15 (×3): qty 2

## 2017-12-15 MED ORDER — CEPHALEXIN 250 MG PO CAPS
250.0000 mg | ORAL_CAPSULE | Freq: Three times a day (TID) | ORAL | Status: DC
  Administered 2017-12-15 – 2017-12-23 (×23): 250 mg via ORAL
  Filled 2017-12-15 (×26): qty 1

## 2017-12-15 MED ORDER — CARBAMAZEPINE 200 MG PO TABS: 200.0000 mg | ORAL_TABLET | Freq: Two times a day (BID) | ORAL | Status: DC

## 2017-12-15 MED ORDER — LORAZEPAM 2 MG PO TABS
2.0000 mg | ORAL_TABLET | Freq: Every day | ORAL | Status: DC
  Administered 2017-12-15 – 2017-12-22 (×8): 2 mg via ORAL
  Filled 2017-12-15 (×8): qty 1

## 2017-12-15 MED ORDER — TIMOLOL MALEATE 0.5 % OP SOLN
1.0000 [drp] | Freq: Two times a day (BID) | OPHTHALMIC | Status: DC
  Administered 2017-12-15: 1 [drp] via OPHTHALMIC
  Filled 2017-12-15: qty 5

## 2017-12-15 MED ORDER — ROPINIROLE HCL 1 MG PO TABS
2.0000 mg | ORAL_TABLET | Freq: Every day | ORAL | Status: DC
  Filled 2017-12-15: qty 2

## 2017-12-15 MED ORDER — ACETAMINOPHEN 325 MG PO TABS: 650.0000 mg | ORAL_TABLET | Freq: Four times a day (QID) | ORAL | Status: DC | PRN

## 2017-12-15 MED ORDER — TIMOLOL MALEATE 0.5 % OP SOLN
1.0000 [drp] | Freq: Two times a day (BID) | OPHTHALMIC | Status: DC
  Administered 2017-12-16 – 2017-12-23 (×15): 1 [drp] via OPHTHALMIC
  Filled 2017-12-15: qty 5

## 2017-12-15 MED ORDER — OLANZAPINE 5 MG PO TABS: 15.0000 mg | ORAL_TABLET | Freq: Every day | ORAL | Status: DC

## 2017-12-15 MED ORDER — ROPINIROLE HCL 1 MG PO TABS
2.0000 mg | ORAL_TABLET | Freq: Every day | ORAL | Status: DC
  Administered 2017-12-15: 2 mg via ORAL
  Filled 2017-12-15 (×2): qty 2

## 2017-12-15 MED ORDER — MAGNESIUM HYDROXIDE 400 MG/5ML PO SUSP: 30.0000 mL | Freq: Every day | ORAL | Status: DC | PRN

## 2017-12-15 MED ORDER — ALUM & MAG HYDROXIDE-SIMETH 200-200-20 MG/5ML PO SUSP: 30.0000 mL | ORAL | Status: DC | PRN

## 2017-12-15 MED ORDER — ROPINIROLE HCL 1 MG PO TABS: 2.0000 mg | ORAL_TABLET | Freq: Every day | ORAL | Status: DC

## 2017-12-15 MED ORDER — DICYCLOMINE HCL 20 MG PO TABS
20.0000 mg | ORAL_TABLET | Freq: Three times a day (TID) | ORAL | Status: DC
  Administered 2017-12-16 – 2017-12-23 (×17): 20 mg via ORAL
  Filled 2017-12-15 (×24): qty 1

## 2017-12-15 MED ORDER — METOPROLOL TARTRATE 25 MG PO TABS
25.0000 mg | ORAL_TABLET | Freq: Two times a day (BID) | ORAL | Status: DC
Start: 2017-12-16 — End: 2017-12-23
  Administered 2017-12-16 – 2017-12-22 (×8): 25 mg via ORAL
  Filled 2017-12-15 (×12): qty 1

## 2017-12-15 MED ORDER — FAMOTIDINE 20 MG PO TABS
20.0000 mg | ORAL_TABLET | Freq: Every day | ORAL | Status: DC
  Administered 2017-12-16 – 2017-12-23 (×8): 20 mg via ORAL
  Filled 2017-12-15 (×8): qty 1

## 2017-12-15 MED ORDER — CARBAMAZEPINE 200 MG PO TABS
200.0000 mg | ORAL_TABLET | Freq: Two times a day (BID) | ORAL | Status: DC
  Administered 2017-12-16 – 2017-12-17 (×2): 200 mg via ORAL
  Filled 2017-12-15 (×2): qty 1

## 2017-12-15 MED ORDER — ACETAMINOPHEN 500 MG PO TABS
1000.0000 mg | ORAL_TABLET | Freq: Once | ORAL | Status: AC
  Administered 2017-12-15: 1000 mg via ORAL
  Filled 2017-12-15: qty 2

## 2017-12-15 MED ORDER — LORAZEPAM 2 MG PO TABS: 2.0000 mg | ORAL_TABLET | Freq: Every day | ORAL | Status: DC

## 2017-12-15 MED ORDER — CEPHALEXIN 250 MG PO CAPS
250.0000 mg | ORAL_CAPSULE | Freq: Three times a day (TID) | ORAL | Status: DC
  Filled 2017-12-15: qty 1

## 2017-12-15 MED ORDER — DICYCLOMINE HCL 20 MG PO TABS
20.0000 mg | ORAL_TABLET | Freq: Three times a day (TID) | ORAL | Status: DC
  Administered 2017-12-15: 20 mg via ORAL
  Filled 2017-12-15: qty 1

## 2017-12-15 NOTE — ED Notes (Signed)
Pt updated on delay of arrival downstairs in admission unit. Pt verbalizes understanding.

## 2017-12-15 NOTE — ED Notes (Signed)
Pt's belongings removed from [place] by security and given to scott, ed tech to take with pt during admission.

## 2017-12-15 NOTE — ED Notes (Signed)
PT VOL/ PENDING  GOING  TO  BEH MED TONIGHT

## 2017-12-15 NOTE — ED Notes (Signed)
Spoke with alex, rn who verbalizes understanding that pt will need nephrology consult ordered in computer for dialysis.

## 2017-12-15 NOTE — BH Assessment (Signed)
Per Javier Docker Nurse Gregory, patient is medically cleared.

## 2017-12-15 NOTE — BH Assessment (Signed)
Assessment Note  Joyce [name] is an 72 y.o. female. Joyce [name] arrived to the ED by personal transportation by her son.  She reports that "I think I need a check up for my bipolar".  She reports that she is feeling anxious and is worried about things that she has not been worried about.  She states that she has had this concern for about 2 weeks.  She reports that she is some depressed.  She shared that she is sleepy all the time.  She denied symptoms of anxiety.  She denied having auditory or visual hallucinations. She denied suicidal ideation or intent.  She denied homicidal ideation or intent.  She reports that she is facing some stressor, but declined to share with the assessor.  She denied the use of alcohol or drugs.    Diagnosis: bipolar disorder  Past Medical History:  Past Medical History:  Diagnosis Date  . Anal fissure   . Bipolar affective disorder ([place])   . CKD (chronic kidney disease)    Dr Holley Raring [name] 4  . Colon polyps   . Diverticulitis   . Diverticulitis   . Family history of adverse reaction to anesthesia    Joyce [name] - PONV  . GERD (gastroesophageal reflux disease)   . Headache    migraines - none over 10 yrs  . Heart murmur   . History of hiatal hernia   . Hypertension   . Pancreatitis    Valproic acid  . Vertigo     Past Surgical History:  Procedure Laterality Date  . A/V FISTULAGRAM Left 04/14/2017   Procedure: A/V Fistulagram;  Surgeon: Katha Cabal, MD;  Location: [place] [facility];  Service: Cardiovascular;  Laterality: Left;  . A/V FISTULAGRAM Left 06/09/2017   Procedure: A/V FISTULAGRAM;  Surgeon: Katha Cabal, MD;  Location: [place] [facility];  Service: Cardiovascular;  Laterality: Left;  . A/V FISTULAGRAM Left 11/02/2017   Procedure: A/V FISTULAGRAM;  Surgeon: Algernon Huxley, MD;  Location: [place] [facility];  Service: Cardiovascular;  Laterality: Left;  . A/V SHUNT INTERVENTION N/A 11/02/2017   Procedure: A/V SHUNT INTERVENTION;   Surgeon: Algernon Huxley, MD;  Location: [place] [facility];  Service: Cardiovascular;  Laterality: N/A;  . ABDOMINAL HYSTERECTOMY  1990 ?  . AV FISTULA PLACEMENT  4/30  . BREAST EXCISIONAL BIOPSY Left 1994   neg surgical bx  . CHOLECYSTECTOMY  2003  . COLONOSCOPY  2014   Dr. Jamal Collin  . COLONOSCOPY WITH PROPOFOL N/A 09/24/2015   Procedure: COLONOSCOPY WITH random colon byopies.;  Surgeon: Lucilla Lame, MD;  Location: [place];  Service: Endoscopy;  Laterality: N/A;  . ESOPHAGOGASTRODUODENOSCOPY (EGD) WITH PROPOFOL N/A 09/24/2015   Procedure: ESOPHAGOGASTRODUODENOSCOPY (EGD) ;  Surgeon: Lucilla Lame, MD;  Location: [place];  Service: Endoscopy;  Laterality: N/A;  . EYE SURGERY    . PERIPHERAL VASCULAR CATHETERIZATION N/A 05/29/2015   Procedure: A/V Shuntogram/Fistulagram;  Surgeon: Katha Cabal, MD;  Location: [place] [facility];  Service: Cardiovascular;  Laterality: N/A;  . PERIPHERAL VASCULAR CATHETERIZATION N/A 05/29/2015   Procedure: A/V Shunt Intervention;  Surgeon: Katha Cabal, MD;  Location: [place] [facility];  Service: Cardiovascular;  Laterality: N/A;  . POLYPECTOMY  09/24/2015   Procedure: POLYPECTOMY INTESTINAL;  Surgeon: Lucilla Lame, MD;  Location: [place];  Service: Endoscopy;;  cecal polyp ascending polyp    Family History:  Family History  Problem Relation Age of Onset  . Stroke Father   .  Hypertension Father   . Breast cancer Other   . Colon cancer Neg Hx   . Liver disease Neg Hx     Social History:  reports that she has never smoked. She has never used smokeless tobacco. She reports that she does not drink alcohol or use drugs.  Additional Social History:  Alcohol / Drug Use History of alcohol / drug use?: No history of alcohol / drug abuse  CIWA: CIWA-Ar BP: (!) 176/51 Pulse Rate: 80 COWS:    Allergies:  Allergies  Allergen Reactions  . Morphine And Related Shortness Of Breath    Pt reports chest pain and  difficulty breathing.  . Indomethacin Hives  . Pollen Extract Other (See Comments)    Sinus problems and HA    Home Medications:  (Not in a hospital admission)  OB/GYN Status:  No LMP recorded. Patient has had a hysterectomy.  General Assessment Data Location of Assessment: [facility] ED TTS Assessment: In system Is this a Tele or Face-to-Face Assessment?: Face-to-Face Is this an Initial Assessment or a Re-assessment for this encounter?: Initial Assessment Marital status: Divorced [place] name: Joyce [name] Is patient pregnant?: No Pregnancy Status: No Living Arrangements: Alone Can pt return to current living arrangement?: Yes Admission Status: Voluntary Is patient capable of signing voluntary admission?: Yes Referral Source: Self/Family/Friend Insurance type: [place] Screening Exam ([place]) Medical Exam completed: Yes  Crisis Care Plan Living Arrangements: Alone Legal Guardian: Other:(Self) Name of Psychiatrist: Lanetta Inch Name of Therapist: None  Education Status Is patient currently in school?: No Is the patient employed, unemployed or receiving disability?: Unemployed  Risk to self with the past 6 months Suicidal Ideation: No Has patient been a risk to self within the past 6 months prior to admission? : No Suicidal Intent: No Has patient had any suicidal intent within the past 6 months prior to admission? : No Is patient at risk for suicide?: No Suicidal Plan?: No Has patient had any suicidal plan within the past 6 months prior to admission? : No Access to Means: No What has been your use of drugs/alcohol within the last 12 months?: denied use Previous Attempts/Gestures: Yes How many times?: 1(10 years ago) Other Self Harm Risks: denied Triggers for Past Attempts: None known Intentional Self Injurious Behavior: None Family Suicide History: No Recent stressful life event(s): (denied) Persecutory voices/beliefs?: No Depression: Yes Depression  Symptoms: Despondent, Feeling worthless/self pity Substance abuse history and/or treatment for substance abuse?: No Suicide prevention information given to non-admitted patients: Not applicable  Risk to Others within the past 6 months Homicidal Ideation: No Does patient have any lifetime risk of violence toward others beyond the six months prior to admission? : No Thoughts of Harm to Others: No Current Homicidal Intent: No Current Homicidal Plan: No Access to Homicidal Means: No Identified Victim: None identified History of harm to others?: No Assessment of Violence: None Noted Violent Behavior Description: denied Does patient have access to weapons?: No Criminal Charges Pending?: No Does patient have a court date: No Is patient on probation?: No  Psychosis Hallucinations: None noted Delusions: None noted  Mental Status Report Appearance/Hygiene: In scrubs Eye Contact: Fair Motor Activity: Unremarkable Speech: Logical/coherent Level of Consciousness: Alert Mood: Depressed Affect: Flat Anxiety Level: None Thought Processes: Coherent Judgement: Partial Orientation: Appropriate for developmental age Obsessive Compulsive Thoughts/Behaviors: None  Cognitive Functioning Concentration: Decreased Memory: Recent Intact Is patient IDD: No Is patient DD?: No Insight: Fair Impulse Control: Good Appetite: Good Have you had any weight  changes? : No Change Sleep: Increased Vegetative Symptoms: Staying in bed  ADLScreening [facility] Assessment Services) Patient's cognitive ability adequate to safely complete daily activities?: Yes Patient able to express need for assistance with ADLs?: Yes Independently performs ADLs?: Yes (appropriate for developmental age)  Prior Inpatient Therapy Prior Inpatient Therapy: Yes Prior Therapy Dates: march 2019 and prior Prior Therapy Facilty/Provider(s): [facility] Reason for Treatment: bipolar disorder  Prior Outpatient Therapy Prior Outpatient  Therapy: Yes Prior Therapy Dates: Current Prior Therapy Facilty/Provider(s): Lanetta Inch Reason for Treatment: Bipolar Disorder Does patient have an ACCT team?: No Does patient have Intensive In-House Services?  : No Does patient have [facility] services? : No Does patient have P4CC services?: No  ADL Screening (condition at time of admission) Patient's cognitive ability adequate to safely complete daily activities?: Yes Is the patient deaf or have difficulty hearing?: No Does the patient have difficulty seeing, even when wearing glasses/contacts?: No Does the patient have difficulty concentrating, remembering, or making decisions?: No Patient able to express need for assistance with ADLs?: Yes Does the patient have difficulty dressing or bathing?: No Independently performs ADLs?: Yes (appropriate for developmental age) Does the patient have difficulty walking or climbing stairs?: No Weakness of Legs: None Weakness of Arms/Hands: None  Home Assistive Devices/Equipment Home Assistive Devices/Equipment: None    Abuse/Neglect Assessment (Assessment to be complete while patient is alone) Abuse/Neglect Assessment Can Be Completed: (Patient denies a history of abuse)                Disposition:  Disposition Initial Assessment Completed for this Encounter: Yes  On Site Evaluation by:   Reviewed with Physician:    Elmer Bales 12/15/2017 12:21 AM

## 2017-12-15 NOTE — BH Assessment (Signed)
Per Dr. [facility] Gum patient meets criteria for inpatient psychiatric treatment once medically cleared. Pending bed on BMU.

## 2017-12-15 NOTE — Progress Notes (Signed)
Pt awake in bed. Presents with flat affect and depressed mood. Calm / cooperative with care. Denies SI, HI, AVH and pain at this time. Compliant with medications when offered. Denies adverse drug reactions when assessed. Tolerates all PO intake well when offered. Emotional support and encouragement offered. Q 15 minutes safety checks maintained. Pt scheduled to transferred to BMU on night shift and she's aware and is in agreement.

## 2017-12-15 NOTE — ED Notes (Signed)
Patient's son and daughter in law want to give addional info to psych. They have went home and will be back in A.M. Around 0830-0900 in waiting room.

## 2017-12-15 NOTE — Consult Note (Signed)
[facility] Face-to-Face Psychiatry Consult   Reason for Consult: Consult for this 72 year old woman with a history of bipolar disorder who was brought to the hospital last night by her family. Referring Physician: Quentin Cornwall Patient Identification: Markiya Keefe MRN:  [id number] Principal Diagnosis: Bipolar I disorder, most recent episode (or current) manic ([place]) Diagnosis:   Patient Active Problem List   Diagnosis Date Noted  . Palliative care encounter [Z51.5]   . Bipolar affective disorder, current episode depressed with psychotic symptoms ([place]) [F31.2] 09/17/2017  . Bipolar I disorder, most recent episode (or current) manic ([place]) [F31.10] 09/02/2017  . Acute delirium [R41.0] 09/02/2017  . Altered mental status [R41.82]   . Acute encephalopathy [G93.40] 09/01/2017  . ESRD on dialysis ([place]) [N18.6, Z99.2] 04/06/2017  . Complication of vascular access for dialysis [T82.9XXA] 04/06/2017  . Hematuria [R31.9] 12/18/2016  . Polycystic kidney [Q61.3] 10/12/2016  . Ruptured cyst of kidney [Q61.00] 10/03/2016  . HTN (hypertension) [I10] 10/01/2016  . Chronic kidney disease [N18.9] 10/01/2016  . Anemia associated with chronic renal failure [N18.9, D63.1] 09/24/2016  . Acute kidney insufficiency [N28.9] 07/28/2016  . Noninfectious diarrhea [K52.9]   . Benign neoplasm of cecum [D12.0]   . Benign neoplasm of ascending colon [D12.2]   . Diarrhea [R19.7]   . Nausea [R11.0]   . Gastric polyp [K31.7]   . Chronic constipation [K59.09] 12/27/2014  . Nausea with vomiting [R11.2] 12/27/2014  . Left sided abdominal pain [R10.9] 12/27/2014  . Personal history of colonic polyps [Z86.010] 05/10/2013    Total Time spent with patient: 1 hour  Subjective:   Nevaen Tredway is a 72 y.o. female patient admitted with "it is my daughter-in-law who is the problem".  HPI: Patient interviewed chart reviewed.  Patient very well-known to me because of her most recent hospitalization.  This is a 72 year old woman  with a long history of bipolar disorder.  Her family brought her to the emergency room last night because they felt like she was becoming unsafe in her behavior.  What the patient describes is that she cannot get along with her daughter-in-law.  She says that she feels like her daughter-in-law has rigid rules and treats her like a child.  It gets on her nerves and she feels that she cannot stand to stay there anymore.  She denies however feeling depressed.  Denies any suicidal or homicidal thoughts.  Denies any hallucinations.  She claims that she is still getting her dialysis regularly and taking all of her medicine.  I am told that the family claims that over the past week or 2 she has become increasingly irritable and anxious and they have alleged that there is been some noncompliance with medicine.  I spoke with the patient's sister who previously at least had been her power of attorney.  Sister reports that she only knows what is going on secondhand through the son and daughter-in-law as well and reports that she had been told that the patient is unable to take care of her own medicine.  Social history: Patient had previously been living independently although her ability to take care of herself and probably been borderline.  She started dialysis within about the last half a year and has had repeated trouble with taking care of all of her health and going to her dialysis since then.  Patient has a sister and during her last hospitalization it was determined that there was a power of attorney form signed giving the sister that power.  Apparently now the  son is claiming that a new form has been signed although I have not seen it.  Patient has been staying in [place] with her son and daughter-in-law since her last hospitalization.  She is now insisting that she will go back home and stay by herself in her own home.  Medical history: End-stage renal disease now on dialysis for about 6 months.  History of high  blood pressure.  Substance abuse history: None    Past Psychiatric History: Patient has a long history of bipolar disorder going back years.  She sees Dr. Thurmond Butts for outpatient treatment.  Had been on multiple medicines in the past but had been stabilized on mostly Trileptal.  Patient had a decompensation of her illness into depressed and mixed symptoms with psychotic symptoms in the early part of this year.  She was treated with ECT in addition to medication and eventually returned to her baseline.  Risk to Self: Suicidal Ideation: No Suicidal Intent: No Is patient at risk for suicide?: No Suicidal Plan?: No Access to Means: No What has been your use of drugs/alcohol within the last 12 months?: denied use How many times?: 1(10 years ago) Other Self Harm Risks: denied Triggers for Past Attempts: None known Intentional Self Injurious Behavior: None Risk to Others: Homicidal Ideation: No Thoughts of Harm to Others: No Current Homicidal Intent: No Current Homicidal Plan: No Access to Homicidal Means: No Identified Victim: None identified History of harm to others?: No Assessment of Violence: None Noted Violent Behavior Description: denied Does patient have access to weapons?: No Criminal Charges Pending?: No Does patient have a court date: No Prior Inpatient Therapy: Prior Inpatient Therapy: Yes Prior Therapy Dates: march 2019 and prior Prior Therapy Facilty/Provider(s): [facility] Reason for Treatment: bipolar disorder Prior Outpatient Therapy: Prior Outpatient Therapy: Yes Prior Therapy Dates: Current Prior Therapy Facilty/Provider([facility]): [facility] Reason for Treatment: Bipolar Disorder Does patient have an ACCT team?: No Does patient have Intensive In-House Services?  : No Does patient have [facility] services? : No Does patient have P4CC services?: No  Past Medical History:  Past Medical History:  Diagnosis Date  . Anal fissure   . Bipolar affective disorder ([place])   . CKD (chronic  kidney disease)    Dr Holley Raring [name] 4  . Colon polyps   . Diverticulitis   . Diverticulitis   . Family history of adverse reaction to anesthesia    mom - PONV  . GERD (gastroesophageal reflux disease)   . Headache    migraines - none over 10 yrs  . Heart murmur   . History of hiatal hernia   . Hypertension   . Pancreatitis    Valproic acid  . Vertigo     Past Surgical History:  Procedure Laterality Date  . A/V FISTULAGRAM Left 04/14/2017   Procedure: A/V Fistulagram;  Surgeon: Katha Cabal, MD;  Location: [place] [facility];  Service: Cardiovascular;  Laterality: Left;  . A/V FISTULAGRAM Left 06/09/2017   Procedure: A/V FISTULAGRAM;  Surgeon: Katha Cabal, MD;  Location: [place] [facility];  Service: Cardiovascular;  Laterality: Left;  . A/V FISTULAGRAM Left 11/02/2017   Procedure: A/V FISTULAGRAM;  Surgeon: Algernon Huxley, MD;  Location: [place] [facility];  Service: Cardiovascular;  Laterality: Left;  . A/V SHUNT INTERVENTION N/A 11/02/2017   Procedure: A/V SHUNT INTERVENTION;  Surgeon: Algernon Huxley, MD;  Location: [place] [facility];  Service: Cardiovascular;  Laterality: N/A;  . ABDOMINAL HYSTERECTOMY  1990 ?  Marland Kitchen  AV FISTULA PLACEMENT  4/30  . BREAST EXCISIONAL BIOPSY Left 1994   neg surgical bx  . CHOLECYSTECTOMY  2003  . COLONOSCOPY  2014   Dr. Jamal Collin  . COLONOSCOPY WITH PROPOFOL N/A 09/24/2015   Procedure: COLONOSCOPY WITH random colon byopies.;  Surgeon: Lucilla Lame, MD;  Location: [place];  Service: Endoscopy;  Laterality: N/A;  . ESOPHAGOGASTRODUODENOSCOPY (EGD) WITH PROPOFOL N/A 09/24/2015   Procedure: ESOPHAGOGASTRODUODENOSCOPY (EGD) ;  Surgeon: Lucilla Lame, MD;  Location: [place];  Service: Endoscopy;  Laterality: N/A;  . EYE SURGERY    . PERIPHERAL VASCULAR CATHETERIZATION N/A 05/29/2015   Procedure: A/V Shuntogram/Fistulagram;  Surgeon: Katha Cabal, MD;  Location: [place] [facility];  Service: Cardiovascular;  Laterality:  N/A;  . PERIPHERAL VASCULAR CATHETERIZATION N/A 05/29/2015   Procedure: A/V Shunt Intervention;  Surgeon: Katha Cabal, MD;  Location: [place] [facility];  Service: Cardiovascular;  Laterality: N/A;  . POLYPECTOMY  09/24/2015   Procedure: POLYPECTOMY INTESTINAL;  Surgeon: Lucilla Lame, MD;  Location: [place];  Service: Endoscopy;;  cecal polyp ascending polyp   Family History:  Family History  Problem Relation Age of Onset  . Stroke Father   . Hypertension Father   . Breast cancer Other   . Colon cancer Neg Hx   . Liver disease Neg Hx    Family Psychiatric  History: None known Social History:  Social History   Substance and Sexual Activity  Alcohol Use No  . Alcohol/week: 0.0 oz     Social History   Substance and Sexual Activity  Drug Use No    Social History   Socioeconomic History  . Marital status: Single    Spouse name: Not on file  . Number of children: 1  . Years of education: Not on file  . Highest education level: Not on file  Occupational History  . Occupation: Surveyor, quantity: [employer]  Social Needs  . Financial resource strain: Not on file  . Food insecurity:    Worry: Not on file    Inability: Not on file  . Transportation needs:    Medical: Not on file    Non-medical: Not on file  Tobacco Use  . Smoking status: Never Smoker  . Smokeless tobacco: Never Used  Substance and Sexual Activity  . Alcohol use: No    Alcohol/week: 0.0 oz  . Drug use: No  . Sexual activity: Not Currently  Lifestyle  . Physical activity:    Days per week: Not on file    Minutes per session: Not on file  . Stress: Not on file  Relationships  . Social connections:    Talks on phone: Not on file    Gets together: Not on file    Attends religious service: Not on file    Active member of club or organization: Not on file    Attends meetings of clubs or organizations: Not on file    Relationship status: Not on file  Other Topics Concern  . 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   Additional Social History:    Allergies:   Allergies  Allergen Reactions  . Morphine And Related Shortness Of Breath    Pt reports chest pain and difficulty breathing.  . Indomethacin Hives  . Pollen Extract Other (See Comments)    Sinus problems and HA  Labs:  Results for orders placed or performed during the hospital encounter of 12/14/17 (from the past 48 hour(s))  Comprehensive metabolic panel     Status: Abnormal   Collection Time: 12/14/17 10:40 PM  Result Value Ref Range   Sodium 136 135 - 145 mmol/L   Potassium 3.3 (L) 3.5 - 5.1 mmol/L   Chloride 98 (L) 101 - 111 mmol/L   CO2 28 22 - 32 mmol/L   Glucose, Bld 119 (H) 65 - 99 mg/dL   BUN 10 6 - 20 mg/dL   Creatinine, Ser 3.03 (H) 0.44 - 1.00 mg/dL   Calcium 8.8 (L) 8.9 - 10.3 mg/dL   Total Protein 6.7 6.5 - 8.1 g/dL   Albumin 3.8 3.5 - 5.0 g/dL   AST 24 15 - 41 U/L   ALT 12 (L) 14 - 54 U/L   Alkaline Phosphatase 68 38 - 126 U/L   Total Bilirubin 0.4 0.3 - 1.2 mg/dL   GFR calc non Af Amer 14 (L) >60 mL/min   GFR calc Af Amer 17 (L) >60 mL/min    Comment: (NOTE) The eGFR has been calculated using the CKD EPI equation. This calculation has not been validated in all clinical situations. eGFR's persistently <60 mL/min signify possible Chronic Kidney Disease.    Anion gap 10 5 - 15    Comment: Performed at [facility], [address] [place], [place] [address]  Ethanol     Status: None   Collection Time: 12/14/17 10:40 PM  Result Value Ref Range   Alcohol, Ethyl (B) <10 <10 mg/dL    Comment: (NOTE) Lowest detectable limit for serum alcohol is 10 mg/dL. For medical purposes only. Performed at [facility], [address] [place], [place] [address]   Urine Drug Screen, Qualitative     Status: None   Collection Time: 12/14/17 10:40 PM  Result Value Ref Range    Tricyclic, Ur Screen NONE DETECTED NONE DETECTED   Amphetamines, Ur Screen NONE DETECTED NONE DETECTED   MDMA (Ecstasy)Ur Screen NONE DETECTED NONE DETECTED   Cocaine Metabolite,Ur [place] NONE DETECTED NONE DETECTED   Opiate, Ur Screen NONE DETECTED NONE DETECTED   Phencyclidine (PCP) Ur S NONE DETECTED NONE DETECTED   Cannabinoid 50 Ng, Ur [place] NONE DETECTED NONE DETECTED   Barbiturates, Ur Screen NONE DETECTED NONE DETECTED   Benzodiazepine, Ur Scrn NONE DETECTED NONE DETECTED   Methadone Scn, Ur NONE DETECTED NONE DETECTED    Comment: (NOTE) Tricyclics + metabolites, urine    Cutoff 1000 ng/mL Amphetamines + metabolites, urine  Cutoff 1000 ng/mL MDMA (Ecstasy), urine              Cutoff 500 ng/mL Cocaine Metabolite, urine          Cutoff 300 ng/mL Opiate + metabolites, urine        Cutoff 300 ng/mL Phencyclidine (PCP), urine         Cutoff 25 ng/mL Cannabinoid, urine                 Cutoff 50 ng/mL Barbiturates + metabolites, urine  Cutoff 200 ng/mL Benzodiazepine, urine              Cutoff 200 ng/mL Methadone, urine                   Cutoff 300 ng/mL The urine drug screen provides only a preliminary, unconfirmed analytical test result and should not be used for non-medical purposes. Clinical consideration and professional judgment  should be applied to any positive drug screen result due to possible interfering substances. A more specific alternate chemical method must be used in order to obtain a confirmed analytical result. Gas chromatography / mass spectrometry (GC/MS) is the preferred confirmat ory method. Performed at [facility], [address] [place], [place] [address]   CBC with Diff     Status: Abnormal   Collection Time: 12/14/17 10:40 PM  Result Value Ref Range   WBC 7.9 3.6 - 11.0 K/uL   RBC 3.42 (L) 3.80 - 5.20 MIL/uL   Hemoglobin 11.8 (L) 12.0 - 16.0 g/dL   HCT 34.8 (L) 35.0 - 47.0 %   MCV 101.8 (H) 80.0 - 100.0 fL   MCH 34.5 (H) 26.0 - 34.0 pg   MCHC  33.9 32.0 - 36.0 g/dL   RDW 16.1 (H) 11.5 - 14.5 %   Platelets 252 150 - 440 K/uL   Neutrophils Relative % 77 %   Neutro Abs 6.2 1.4 - 6.5 K/uL   Lymphocytes Relative 14 %   Lymphs Abs 1.1 1.0 - 3.6 K/uL   Monocytes Relative 7 %   Monocytes Absolute 0.5 0.2 - 0.9 K/uL   Eosinophils Relative 1 %   Eosinophils Absolute 0.1 0 - 0.7 K/uL   Basophils Relative 1 %   Basophils Absolute 0.0 0 - 0.1 K/uL    Comment: Performed at [facility], [address] [place], [place] [address]  Acetaminophen level     Status: Abnormal   Collection Time: 12/14/17 10:40 PM  Result Value Ref Range   Acetaminophen (Tylenol), Serum <10 (L) 10 - 30 ug/mL    Comment: (NOTE) Therapeutic concentrations vary significantly. A range of 10-30 ug/mL  may be an effective concentration for many patients. However, some  are best treated at concentrations outside of this range. Acetaminophen concentrations >150 ug/mL at 4 hours after ingestion  and >50 ug/mL at 12 hours after ingestion are often associated with  toxic reactions. Performed at [facility], [address] [place], [place] [address]   Salicylate level     Status: None   Collection Time: 12/14/17 10:40 PM  Result Value Ref Range   Salicylate Lvl <9.1 2.8 - 30.0 mg/dL    Comment: Performed at [facility], [address] [place], [place] [address]  Urinalysis, Complete w Microscopic     Status: Abnormal   Collection Time: 12/14/17 10:40 PM  Result Value Ref Range   Color, Urine YELLOW (A) YELLOW   APPearance CLEAR (A) CLEAR   Specific Gravity, Urine 1.006 1.005 - 1.030   pH 8.0 5.0 - 8.0   Glucose, UA NEGATIVE NEGATIVE mg/dL   Hgb urine dipstick NEGATIVE NEGATIVE   Bilirubin Urine NEGATIVE NEGATIVE   Ketones, ur NEGATIVE NEGATIVE mg/dL   Protein, ur NEGATIVE NEGATIVE mg/dL   Nitrite NEGATIVE NEGATIVE   Leukocytes, UA MODERATE (A) NEGATIVE   RBC / HPF 0-5 0 - 5 RBC/hpf   WBC, UA 11-20 0 - 5 WBC/hpf   Bacteria,  UA NONE SEEN NONE SEEN   Squamous Epithelial / LPF 0-5 0 - 5    Comment: Performed at [facility], [address] [place], [place] [address]  TSH     Status: None   Collection Time: 12/14/17 10:40 PM  Result Value Ref Range   TSH 2.030 0.350 - 4.500 uIU/mL    Comment: Performed by a 3rd Generation assay with a functional sensitivity of <=0.01 uIU/mL. Performed at [facility], [address], [place], [place] [address]     Current Facility-Administered Medications  Medication Dose Route Frequency Provider Last Rate Last Dose  . cephALEXin (KEFLEX) capsule 250 mg  250 mg Oral Q8H Paulette Blanch, MD   250 mg at 12/15/17 0534  . famotidine (PEPCID) tablet 20 mg  20 mg Oral Daily Paulette Blanch, MD   20 mg at 12/15/17 0916  . metoprolol tartrate (LOPRESSOR) tablet 25 mg  25 mg Oral BID Paulette Blanch, MD   25 mg at 12/15/17 0916  . midodrine (PROAMATINE) tablet 10 mg  10 mg Oral Daily Paulette Blanch, MD   10 mg at 12/15/17 1100  . OLANZapine (ZYPREXA) tablet 15 mg  15 mg Oral QHS Paulette Blanch, MD      . rOPINIRole (REQUIP) tablet 2 mg  2 mg Oral Daily Paulette Blanch, MD   2 mg at 12/15/17 1100  . timolol (TIMOPTIC) 0.5 % ophthalmic solution 1 drop  1 drop Both Eyes BID Merlyn Lot, MD   1 drop at 12/15/17 1450   Current Outpatient Medications  Medication Sig Dispense Refill  . dicyclomine (BENTYL) 20 MG tablet Take 1 tablet (20 mg total) by mouth 3 (three) times daily before meals. (Patient taking differently: Take 20 mg by mouth daily. ) 90 tablet 1  . famotidine (PEPCID) 20 MG tablet Take 1 tablet (20 mg total) by mouth daily. 30 tablet 1  . haloperidol (HALDOL) 5 MG tablet TAKE 1/2 TABLET BY MOUTH IN THE MORNING AND 1 TABLET AT BEDTIME  1  . lidocaine-prilocaine (EMLA) cream Apply 1 application every Monday, Wednesday, and Friday topically. At dialysis  3  . LORazepam (ATIVAN) 2 MG tablet Take 1 tablet (2 mg total) by mouth at bedtime. 30 tablet 0  . metoprolol tartrate  (LOPRESSOR) 25 MG tablet Take 0.5 tablets (12.5 mg total) by mouth 2 (two) times daily. (Patient taking differently: Take 25 mg by mouth 2 (two) times daily. ) 60 tablet 1  . midodrine (PROAMATINE) 10 MG tablet Take 1 tablet (10 mg total) by mouth daily. 30 tablet 1  . multivitamin (RENA-VIT) TABS tablet TAKE 1 TABLET BY MOUTH EVERYDAY AT BEDTIME 30 tablet 0  . OLANZapine (ZYPREXA) 15 MG tablet Take 1 tablet (15 mg total) by mouth at bedtime. 30 tablet 1  . rOPINIRole (REQUIP) 2 MG tablet Take 1 tablet (2 mg total) by mouth at bedtime. (Patient taking differently: Take 2 mg by mouth daily. ) 30 tablet 1  . acetaminophen (TYLENOL) 325 MG tablet Take 325 mg 2 (two) times daily as needed by mouth for moderate pain or headache.     . brimonidine (ALPHAGAN) 0.2 % ophthalmic solution Place 1 drop into both eyes 2 (two) times daily. 5 mL 1  . carbamazepine (TEGRETOL) 200 MG tablet Take 1 tablet (200 mg total) by mouth 2 (two) times daily. 60 tablet 1  . cholecalciferol (VITAMIN D) 1000 UNITS tablet Take 1,000 Units by mouth daily.    . cyanocobalamin (,VITAMIN B-12,) 1000 MCG/ML injection Inject 1,000 mcg into the muscle every 30 (thirty) days.    Marland Kitchen epoetin alfa (EPOGEN,PROCRIT) 4000 UNIT/ML injection Inject 1 mL (4,000 Units total) into the vein every Monday, Wednesday, and Friday with hemodialysis. 1 mL 6  . lidocaine (LIDODERM) 5 % Place 1 patch onto the skin daily. Remove & Discard patch within 12 hours or as directed by MD 30 patch 0  . pantoprazole (PROTONIX) 40 MG  tablet TAKE 1 TABLET EVERY DAY 90 tablet 3  . timolol (TIMOPTIC) 0.5 % ophthalmic solution Place 1 drop into both eyes 2 (two) times daily. 10 mL 1    Musculoskeletal: Strength & Muscle Tone: within normal limits Gait & Station: normal Patient leans: N/A  Psychiatric Specialty Exam: Physical Exam  Nursing note and vitals reviewed. Constitutional: She appears well-developed and well-nourished.  HENT:  Head: Normocephalic and  atraumatic.  Eyes: Pupils are equal, round, and reactive to light. Conjunctivae are normal.  Neck: Normal range of motion.  Cardiovascular: Regular rhythm and normal heart sounds.  Respiratory: She is in respiratory distress.  GI: Soft.  Musculoskeletal: Normal range of motion.  Neurological: She is alert.  Skin: Skin is warm and dry.  Psychiatric: Her speech is normal. Her mood appears anxious. She is agitated. She is not aggressive. Thought content is not paranoid. Cognition and memory are normal. She expresses impulsivity. She expresses no homicidal and no suicidal ideation.    Review of Systems  Constitutional: Negative.   HENT: Negative.   Eyes: Negative.   Respiratory: Negative.   Cardiovascular: Negative.   Gastrointestinal: Negative.   Musculoskeletal: Negative.   Skin: Negative.   Neurological: Negative.   Psychiatric/Behavioral: Negative for depression, hallucinations, memory loss, substance abuse and suicidal ideas. The patient is nervous/anxious. The patient does not have insomnia.     Blood pressure (!) 151/72, pulse 85, temperature 98.7 F (37.1 C), temperature source Oral, resp. rate 18, height '5\' 3"'$  (1.6 m), weight 74.8 kg (165 lb), SpO2 98 %.Body mass index is 29.23 kg/m.  General Appearance: Fairly Groomed  Eye Contact:  Good  Speech:  Clear and Coherent  Volume:  Decreased  Mood:  Anxious  Affect:  Congruent  Thought Process:  Goal Directed  Orientation:  Full (Time, Place, and Person)  Thought Content:  Rumination and Tangential  Suicidal Thoughts:  No  Homicidal Thoughts:  No  Memory:  Immediate;   Fair Recent;   Fair Remote;   Fair  Judgement:  Impaired  Insight:  Fair  Psychomotor Activity:  Normal  Concentration:  Concentration: Fair  Recall:  [employer] of Knowledge:  Fair  Language:  Fair  Akathisia:  No  Handed:  Right  AIMS (if indicated):     Assets:  Desire for Improvement Housing Resilience Social Support  ADL's:  Intact   Cognition:  Impaired,  Mild  Sleep:        Treatment Plan Summary: Daily contact with patient to assess and evaluate symptoms and progress in treatment, Medication management and Plan This is a 72 year old woman with a history of bipolar disorder.  Family is reporting that her symptoms have been getting worse and that they have not been able to manage her particularly now that she is insisting on trying to go back to live at her own home.  They believe that if she were to do this it would be a disaster.  To my examination in the emergency room the patient seems to be about as good as she was when she left the hospital.  She was alert and oriented x4 her memory was intact her judgment and insight were intact.  Patient however is refusing to go back to stay with her son and daughter-in-law and the plan to go home sounds half baked at best.  After speaking with her sister I did not feel reassured.  I think we are going to go ahead and proceed with admission to the hospital  on the understanding that she is having some worsening of her symptoms and needs stabilization.  At this point she does not appear to need ECT.  Orders will be completed to continue usual medicine.  She will need to continue getting her dialysis here in the hospital.  Disposition: Recommend psychiatric Inpatient admission when medically cleared. Supportive therapy provided about ongoing stressors.  Alethia Berthold, MD 12/15/2017 3:14 PM

## 2017-12-15 NOTE — Care Management (Signed)
Joyce [name] with outpatient dialysis/[facility] updated on patient's presentation.

## 2017-12-15 NOTE — Clinical Social Work Note (Addendum)
CSW received a consult for "placement." CSW staffed with EDP Dr. Quentin Cornwall. CSW received report from [facility] stating patient living with son because she was not taking her medication or managing her ADLs when living alone. Patient living with her son and daughter-in-law for the last 2 months.CSW met with patient at bedside. Patient stating she does not want to return to their home because they do not want her there. Patient stating she wants to return to her home. CSW explained to patient that no report of son or daughter-in-law not wanting patient to return to their home or not wanting to pick her up has been reported. CSW encouraged that patient return to son's home and discuss, with her son, wanting to return to her home. CSW informed patient that CSW would call son to arrange transport. Patient agreeable. CSW called son-George Loyd at [phone] and left voicemail. CSW awaiting callback.   Per Dr. Lasandra Beech note, "Patient is likely to be admitted to the psychiatric ward for stabilization." CSW updated EDRN [place]. CSW awaiting discharge decision. CSW remains available for discharge needs.   3:22pm - Per psych note, patient is to be admitted to psych bed when bed becomes available and will not discharge at this time. TTS following patient at this time. CSW signing off as no further Social Work needs identified. Please reconsult if new Social Work needs arise.   Oretha Ellis, Latanya Presser, [place] Social Worker-ED [phone]

## 2017-12-15 NOTE — ED Notes (Signed)
Pt. Alert and oriented, warm and dry, in no distress. Pt. Denies SI, HI, and AVH. Pt states she does not remember anything from last admission but she was receiving ECT. Pt. Encouraged to let nursing staff know of any concerns or needs.

## 2017-12-15 NOTE — BH Assessment (Signed)
Patient is to be admitted to [facility] by Dr. Weber Cooks.  Attending Physician will be Dr. Bary Leriche.   Patient has been assigned to room 310, by [place] Nurse T'Yawn.   Intake Paper Work has been signed and placed on patient chart.  ER staff is aware of the admission:  Lisa:ER Sectary   Dr.Siedecki: ER MD   [place]: Patient's Nurse   Genella Rife: Patient Access.

## 2017-12-15 NOTE — ED Notes (Signed)
Per EDP patient will need dialysis Wednesday. Patient was dialyzed Monday.

## 2017-12-15 NOTE — ED Notes (Signed)
Pt up and ambulatory to hallway. No acute distress noted.

## 2017-12-15 NOTE — ED Notes (Signed)
Report from [place], rn

## 2017-12-15 NOTE — Consult Note (Signed)
Psychiatry: Brief note.  Full note to follow.  Patient is likely to be admitted to the psychiatric ward for stabilization.

## 2017-12-15 NOTE — ED Notes (Signed)
Dr. Cherylann Banas notified of need for MWF dialysis order.

## 2017-12-16 ENCOUNTER — Other Ambulatory Visit: Payer: Self-pay

## 2017-12-16 DIAGNOSIS — N390 Urinary tract infection, site not specified: Secondary | ICD-10-CM | POA: Diagnosis present

## 2017-12-16 DIAGNOSIS — F3110 Bipolar disorder, current episode manic without psychotic features, unspecified: Principal | ICD-10-CM

## 2017-12-16 LAB — CBC
HEMATOCRIT: 33.4 % — AB (ref 35.0–47.0)
HEMATOCRIT: 38 % (ref 35.0–47.0)
Hemoglobin: 11.2 g/dL — ABNORMAL LOW (ref 12.0–16.0)
Hemoglobin: 13 g/dL (ref 12.0–16.0)
MCH: 34.5 pg — AB (ref 26.0–34.0)
MCH: 35.4 pg — AB (ref 26.0–34.0)
MCHC: 33.5 g/dL (ref 32.0–36.0)
MCHC: 34.4 g/dL (ref 32.0–36.0)
MCV: 103 fL — AB (ref 80.0–100.0)
MCV: 103 fL — AB (ref 80.0–100.0)
Platelets: 208 10*3/uL (ref 150–440)
Platelets: 234 10*3/uL (ref 150–440)
RBC: 3.24 MIL/uL — ABNORMAL LOW (ref 3.80–5.20)
RBC: 3.68 MIL/uL — ABNORMAL LOW (ref 3.80–5.20)
RDW: 15.8 % — AB (ref 11.5–14.5)
RDW: 16.1 % — ABNORMAL HIGH (ref 11.5–14.5)
WBC: 7.3 10*3/uL (ref 3.6–11.0)
WBC: 6.6 10*3/uL (ref 3.6–11.0)

## 2017-12-16 LAB — RENAL FUNCTION PANEL
Albumin: 3.9 g/dL (ref 3.5–5.0)
Albumin: 4.4 g/dL (ref 3.5–5.0)
Anion gap: 13 (ref 5–15)
Anion gap: 12 (ref 5–15)
BUN: 21 mg/dL — AB (ref 6–20)
BUN: 7 mg/dL (ref 6–20)
CHLORIDE: 98 mmol/L — AB (ref 101–111)
CHLORIDE: 96 mmol/L — AB (ref 101–111)
CO2: 27 mmol/L (ref 22–32)
CO2: 32 mmol/L (ref 22–32)
Calcium: 9 mg/dL (ref 8.9–10.3)
Calcium: 9.2 mg/dL (ref 8.9–10.3)
Creatinine, Ser: 4.92 mg/dL — ABNORMAL HIGH (ref 0.44–1.00)
Creatinine, Ser: 2.27 mg/dL — ABNORMAL HIGH (ref 0.44–1.00)
GFR calc Af Amer: 9 mL/min — ABNORMAL LOW (ref >60)
GFR calc Af Amer: 24 mL/min — ABNORMAL LOW (ref >60)
GFR calc non Af Amer: 8 mL/min — ABNORMAL LOW (ref >60)
GFR, EST NON AFRICAN AMERICAN: 21 mL/min — AB (ref >60)
GLUCOSE: 129 mg/dL — AB (ref 65–99)
GLUCOSE: 116 mg/dL — AB (ref 65–99)
POTASSIUM: 3.1 mmol/L — AB (ref 3.5–5.1)
Phosphorus: 4.3 mg/dL (ref 2.5–4.6)
Phosphorus: 2.3 mg/dL — ABNORMAL LOW (ref 2.5–4.6)
Potassium: 3.2 mmol/L — ABNORMAL LOW (ref 3.5–5.1)
Sodium: 138 mmol/L (ref 135–145)
Sodium: 140 mmol/L (ref 135–145)

## 2017-12-16 LAB — GLUCOSE, CAPILLARY: Glucose-Capillary: 115 mg/dL — ABNORMAL HIGH (ref 65–99)

## 2017-12-16 LAB — CARBAMAZEPINE LEVEL, TOTAL: Carbamazepine Lvl: 3.7 ug/mL — ABNORMAL LOW (ref 4.0–12.0)

## 2017-12-16 MED ORDER — PENTAFLUOROPROP-TETRAFLUOROETH EX AERO
1.0000 "application " | INHALATION_SPRAY | CUTANEOUS | Status: DC | PRN
  Filled 2017-12-16: qty 30

## 2017-12-16 MED ORDER — LIDOCAINE-PRILOCAINE 2.5-2.5 % EX CREA
1.0000 "application " | TOPICAL_CREAM | CUTANEOUS | Status: DC | PRN
  Filled 2017-12-16: qty 5

## 2017-12-16 MED ORDER — HEPARIN SODIUM (PORCINE) 1000 UNIT/ML DIALYSIS
1000.0000 [IU] | INTRAMUSCULAR | Status: DC | PRN
  Filled 2017-12-16: qty 1

## 2017-12-16 MED ORDER — SODIUM CHLORIDE 0.9 % IV SOLN: 100.0000 mL | INTRAVENOUS | Status: DC | PRN

## 2017-12-16 MED ORDER — ALTEPLASE 2 MG IJ SOLR
2.0000 mg | Freq: Once | INTRAMUSCULAR | Status: DC | PRN
  Filled 2017-12-16: qty 2

## 2017-12-16 MED ORDER — LIDOCAINE HCL (PF) 1 % IJ SOLN
5.0000 mL | INTRAMUSCULAR | Status: DC | PRN
  Filled 2017-12-16: qty 5

## 2017-12-16 NOTE — Progress Notes (Signed)
Post HD assessment. Pt tolerated tx well without c/o or complication. Net UF 1536, goal met    12/16/17 1644  Vital Signs  Temp 97.7 F (36.5 C)  Temp Source Oral  Pulse Rate (!) 58  Pulse Rate Source Monitor  Resp 20  BP (!) 142/54  BP Location Right Arm  BP Method Automatic  Patient Position (if appropriate) Lying  Oxygen Therapy  SpO2 100 %  O2 Device Room Air  Dialysis Weight  Weight  (unable to weigh pt, no scale available )  Type of Weight Post-Dialysis  Post-Hemodialysis Assessment  Rinseback Volume (mL) 250 mL  KECN 54.7 V  Dialyzer Clearance Lightly streaked  Duration of HD Treatment -hour(s) 3.5 hour(s)  Hemodialysis Intake (mL) 500 mL  UF Total -Machine (mL) 2036 mL  Net UF (mL) 1536 mL  Tolerated HD Treatment Yes  AVG/AVF Arterial Site Held (minutes) 10 minutes  AVG/AVF Venous Site Held (minutes) 10 minutes  Education / Care Plan  Dialysis Education Provided Yes  Documented Education in Care Plan Yes

## 2017-12-16 NOTE — H&P (Signed)
Psychiatric Admission Assessment Adult  Patient Identification: Joyce [name] MRN:  [id number] Date of Evaluation:  12/16/2017 Chief Complaint:  Bipolar Disorder Principal Diagnosis: Bipolar I disorder, most recent episode (or current) manic ([place]) Diagnosis:   Patient Active Problem List   Diagnosis Date Noted  . Bipolar affective disorder, current episode depressed with psychotic symptoms ([place]) [F31.2] 09/17/2017    Priority: High  . Bipolar I disorder, most recent episode (or current) manic ([place]) [F31.10] 09/02/2017    Priority: High  . UTI (urinary tract infection) [N39.0] 12/16/2017  . Bipolar 1 disorder, depressed ([place]) [F31.9] 12/15/2017  . Palliative care encounter [Z51.5]   . Acute delirium [R41.0] 09/02/2017  . Altered mental status [R41.82]   . Acute encephalopathy [G93.40] 09/01/2017  . ESRD on dialysis ([place]) [N18.6, Z99.2] 04/06/2017  . Complication of vascular access for dialysis [T82.9XXA] 04/06/2017  . Hematuria [R31.9] 12/18/2016  . Polycystic kidney [Q61.3] 10/12/2016  . Ruptured cyst of kidney [Q61.00] 10/03/2016  . HTN (hypertension) [I10] 10/01/2016  . Chronic kidney disease [N18.9] 10/01/2016  . Anemia associated with chronic renal failure [N18.9, D63.1] 09/24/2016  . Acute kidney insufficiency [N28.9] 07/28/2016  . Noninfectious diarrhea [K52.9]   . Benign neoplasm of cecum [D12.0]   . Benign neoplasm of ascending colon [D12.2]   . Diarrhea [R19.7]   . Nausea [R11.0]   . Gastric polyp [K31.7]   . Chronic constipation [K59.09] 12/27/2014  . Nausea with vomiting [R11.2] 12/27/2014  . Left sided abdominal pain [R10.9] 12/27/2014  . Personal history of colonic polyps [Z86.010] 05/10/2013   History of Present Illness:   Identifying data. Joyce [name] is a 72 year old female with a history of bipolar disorder.  Chief complaint. "I got a little upset."  History of present illness. Information was obtained from the patient and the chart. The patient was  petitioned by her family who was worried that Joyce [name] has not been taking medications and started another bipolar episode as she became argumentative and unreasonable. The patient admits that she has not been herself lately and regrets having arguments with her son and daughter in law. She has been staying with them since discharge from the hospital on April 4. They have been providing excellent care but the patient decided that she no longer wants to stay there as they are "too controlling" and wanted to return to her own house. The family was so frightened with this unreasonable decision that they put her in the hospital. She was discovered to have UTI and was started on antibiotic in the ER. The patient denies any symptoms of depression, anxiety or psychosis. She reports good compliance with medications and dialyses.   She already feels better and has more insight into her situation. She met with treatment team this morning. She admits that she could not handle her medications or dialysis without support from her children. She can list many benefits of living together. She likes her new dialysis team. She admits that everybody is carrying and supportive but she misses her house and church here.   The patient presents very well today and has good insight into her problems. Unfortunately, last time she was admitted under similar circumstances and rapidly became very severely ill.   Past psychiatric history. Long history of bipolar with several hospitalization and many medication trials. She has been in the care of Dr. Lanetta Inch and is stable on a combination of Zyprexa 15 mg, Tegretol 200 mg BID, Haldol 2.5 mg BID, and Ativan 2 mg nightly.  Family  psychiatric history.  Social history. 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 Until recent hospitalization,. She had been living independently alone.  Total Time spent with patient: 1 hour  Is the patient at risk to self?  No.  Has the patient been a risk to self in the past 6 months? No.  Has the patient been a risk to self within the distant past? No.  Is the patient a risk to others? No.  Has the patient been a risk to others in the past 6 months? No.  Has the patient been a risk to others within the distant past? No.   Prior Inpatient Therapy:   Prior Outpatient Therapy:    Alcohol Screening: 1. How often do you have a drink containing alcohol?: Monthly or less 2. How many drinks containing alcohol do you have on a typical day when you are drinking?: 3 or 4 3. How often do you have six or more drinks on one occasion?: Never AUDIT-C Score: 2 4. How often during the last year have you found that you were not able to stop drinking once you had started?: Never 5. How often during the last year have you failed to do what was normally expected from you becasue of drinking?: Never 6. How often during the last year have you needed a first drink in the morning to get yourself going after a heavy drinking session?: Less than monthly 7. How often during the last year have you had a feeling of guilt of remorse after drinking?: Never 8. How often during the last year have you been unable to remember what happened the night before because you had been drinking?: Never 9. Have you or someone else been injured as a result of your drinking?: No 10. Has a relative or friend or a doctor or another health worker been concerned about your drinking or suggested you cut down?: No Alcohol Use Disorder Identification Test Final Score (AUDIT): 3 Intervention/Follow-up: Alcohol Education Substance Abuse History in the last 12 months:  No. Consequences of Substance Abuse: NA Previous Psychotropic Medications: Yes  Psychological Evaluations: No  Past Medical History:  Past Medical History:  Diagnosis Date  . Anal fissure   . Bipolar affective disorder ([place])   . CKD (chronic kidney disease)    Dr Holley Raring [name] 4  . Colon  polyps   . Diverticulitis   . Diverticulitis   . Family history of adverse reaction to anesthesia    mom - PONV  . GERD (gastroesophageal reflux disease)   . Headache    migraines - none over 10 yrs  . Heart murmur   . History of hiatal hernia   . Hypertension   . Pancreatitis    Valproic acid  . Vertigo     Past Surgical History:  Procedure Laterality Date  . A/V FISTULAGRAM Left 04/14/2017   Procedure: A/V Fistulagram;  Surgeon: Katha Cabal, MD;  Location: [place] [facility];  Service: Cardiovascular;  Laterality: Left;  . A/V FISTULAGRAM Left 06/09/2017   Procedure: A/V FISTULAGRAM;  Surgeon: Katha Cabal, MD;  Location: [place] [facility];  Service: Cardiovascular;  Laterality: Left;  . A/V FISTULAGRAM Left 11/02/2017   Procedure: A/V FISTULAGRAM;  Surgeon: Algernon Huxley, MD;  Location: [place] [facility];  Service: Cardiovascular;  Laterality: Left;  . A/V SHUNT INTERVENTION N/A 11/02/2017   Procedure: A/V SHUNT INTERVENTION;  Surgeon: Algernon Huxley, MD;  Location: [place] INVASIVE CV  LAB;  Service: Cardiovascular;  Laterality: N/A;  . ABDOMINAL HYSTERECTOMY  1990 ?  . AV FISTULA PLACEMENT  4/30  . BREAST EXCISIONAL BIOPSY Left 1994   neg surgical bx  . CHOLECYSTECTOMY  2003  . COLONOSCOPY  2014   Dr. Jamal Collin  . COLONOSCOPY WITH PROPOFOL N/A 09/24/2015   Procedure: COLONOSCOPY WITH random colon byopies.;  Surgeon: Lucilla Lame, MD;  Location: [place];  Service: Endoscopy;  Laterality: N/A;  . ESOPHAGOGASTRODUODENOSCOPY (EGD) WITH PROPOFOL N/A 09/24/2015   Procedure: ESOPHAGOGASTRODUODENOSCOPY (EGD) ;  Surgeon: Lucilla Lame, MD;  Location: [place];  Service: Endoscopy;  Laterality: N/A;  . EYE SURGERY    . PERIPHERAL VASCULAR CATHETERIZATION N/A 05/29/2015   Procedure: A/V Shuntogram/Fistulagram;  Surgeon: Katha Cabal, MD;  Location: [place] [facility];  Service: Cardiovascular;  Laterality: N/A;  . PERIPHERAL VASCULAR CATHETERIZATION N/A  05/29/2015   Procedure: A/V Shunt Intervention;  Surgeon: Katha Cabal, MD;  Location: [place] [facility];  Service: Cardiovascular;  Laterality: N/A;  . POLYPECTOMY  09/24/2015   Procedure: POLYPECTOMY INTESTINAL;  Surgeon: Lucilla Lame, MD;  Location: [place];  Service: Endoscopy;;  cecal polyp ascending polyp   Family History:  Family History  Problem Relation Age of Onset  . Stroke Father   . Hypertension Father   . Breast cancer Other   . Colon cancer Neg Hx   . Liver disease Neg Hx    Tobacco Screening: Have you used any form of tobacco in the last 30 days? (Cigarettes, Smokeless Tobacco, Cigars, and/or Pipes): No Social History:  Social History   Substance and Sexual Activity  Alcohol Use No  . Alcohol/week: 0.0 oz     Social History   Substance and Sexual Activity  Drug Use No    Additional Social History:                           Allergies:   Allergies  Allergen Reactions  . Morphine And Related Shortness Of Breath    Pt reports chest pain and difficulty breathing.  . Indomethacin Hives  . Pollen Extract Other (See Comments)    Sinus problems and HA   Lab Results:  Results for orders placed or performed during the hospital encounter of 12/15/17 (from the past 48 hour(s))  Carbamazepine level, total     Status: Abnormal   Collection Time: 12/16/17  6:32 AM  Result Value Ref Range   Carbamazepine Lvl 3.7 (L) 4.0 - 12.0 ug/mL    Comment: Performed at [facility], [address] [place], [place] [address]    Blood Alcohol level:  Lab Results  Component Value Date   [facility] <10 12/14/2017   ETH <10 36/14/4315    Metabolic Disorder Labs:  Lab Results  Component Value Date   HGBA1C 4.8 09/18/2017   MPG 91.06 09/18/2017   No results found for: PROLACTIN Lab Results  Component Value Date   CHOL 251 (H) 09/18/2017   TRIG 124 09/18/2017   HDL 66 09/18/2017   CHOLHDL 3.8 09/18/2017   VLDL 25 09/18/2017   LDLCALC 160  (H) 09/18/2017   LDLCALC 43 05/14/2014    Current Medications: Current Facility-Administered Medications  Medication Dose Route Frequency Provider Last Rate Last Dose  . 0.9 %  sodium chloride infusion  100 mL Intravenous PRN Lateef, Munsoor, MD      . 0.9 %  sodium chloride infusion  100 mL Intravenous PRN  Lateef, Munsoor, MD      . alteplase (CATHFLO ACTIVASE) injection 2 mg  2 mg Intracatheter Once PRN Lateef, Munsoor, MD      . alum & mag hydroxide-simeth (MAALOX/MYLANTA) 200-200-20 MG/5ML suspension 30 mL  30 mL Oral Q4H PRN Clapacs, [name], MD      . carbamazepine (TEGRETOL) tablet 200 mg  200 mg Oral BID Clapacs, [name], MD      . cephALEXin (KEFLEX) capsule 250 mg  250 mg Oral Q8H Braydon Kullman B, MD   250 mg at 12/16/17 0647  . dicyclomine (BENTYL) tablet 20 mg  20 mg Oral TID AC Clapacs, [name], MD      . famotidine (PEPCID) tablet 20 mg  20 mg Oral Daily Clapacs, [name], MD      . haloperidol (HALDOL) tablet 2 mg  2 mg Oral BID Cheron Pasquarelli B, MD   2 mg at 12/15/17 2332  . heparin injection 1,000 Units  1,000 Units Dialysis PRN Lateef, Munsoor, MD      . lidocaine (PF) (XYLOCAINE) 1 % injection 5 mL  5 mL Intradermal PRN Lateef, Munsoor, MD      . lidocaine-prilocaine (EMLA) cream 1 application  1 application Topical PRN Lateef, Munsoor, MD      . LORazepam (ATIVAN) tablet 2 mg  2 mg Oral QHS Alysia Scism B, MD   2 mg at 12/15/17 2324  . magnesium hydroxide (MILK OF MAGNESIA) suspension 30 mL  30 mL Oral Daily PRN Clapacs, [name], MD      . metoprolol tartrate (LOPRESSOR) tablet 25 mg  25 mg Oral BID Clapacs, [name], MD   25 mg at 12/16/17 0831  . midodrine (PROAMATINE) tablet 10 mg  10 mg Oral Daily Clapacs, [name], MD      . OLANZapine (ZYPREXA) tablet 15 mg  15 mg Oral QHS Clapacs, [name], MD      . pentafluoroprop-tetrafluoroeth (GEBAUERS) aerosol 1 application  1 application Topical PRN Lateef, Munsoor, MD      . rOPINIRole (REQUIP) tablet 2 mg  2 mg Oral  Daily Damya Comley B, MD      . timolol (TIMOPTIC) 0.5 % ophthalmic solution 1 drop  1 drop Both Eyes BID Clapacs, [name], MD   1 drop at 12/16/17 0830   PTA Medications: Medications Prior to Admission  Medication Sig Dispense Refill Last Dose  . acetaminophen (TYLENOL) 325 MG tablet Take 325 mg 2 (two) times daily as needed by mouth for moderate pain or headache.    Not Taking at Unknown time  . brimonidine (ALPHAGAN) 0.2 % ophthalmic solution Place 1 drop into both eyes 2 (two) times daily. 5 mL 1   . carbamazepine (TEGRETOL) 200 MG tablet Take 1 tablet (200 mg total) by mouth 2 (two) times daily. 60 tablet 1   . cholecalciferol (VITAMIN D) 1000 UNITS tablet Take 1,000 Units by mouth daily.   Not Taking at Unknown time  . cyanocobalamin (,VITAMIN B-12,) 1000 MCG/ML injection Inject 1,000 mcg into the muscle every 30 (thirty) days.   Not Taking at Unknown time  . dicyclomine (BENTYL) 20 MG tablet Take 1 tablet (20 mg total) by mouth 3 (three) times daily before meals. (Patient taking differently: Take 20 mg by mouth daily. ) 90 tablet 1 Unknown at Unknown  . epoetin alfa (EPOGEN,PROCRIT) 4000 UNIT/ML injection Inject 1 mL (4,000 Units total) into the vein every Monday, Wednesday, and Friday with hemodialysis. 1 mL 6   .  famotidine (PEPCID) 20 MG tablet Take 1 tablet (20 mg total) by mouth daily. 30 tablet 1 Unknown at Unknown  . haloperidol (HALDOL) 5 MG tablet TAKE 1/2 TABLET BY MOUTH IN THE MORNING AND 1 TABLET AT BEDTIME  1 Unknown at Unknown  . lidocaine (LIDODERM) 5 % Place 1 patch onto the skin daily. Remove & Discard patch within 12 hours or as directed by MD 30 patch 0   . lidocaine-prilocaine (EMLA) cream Apply 1 application every Monday, Wednesday, and Friday topically. At dialysis  3 prn at prn  . LORazepam (ATIVAN) 2 MG tablet Take 1 tablet (2 mg total) by mouth at bedtime. 30 tablet 0 Unknown at Unknown  . metoprolol tartrate (LOPRESSOR) 25 MG tablet Take 0.5 tablets (12.5 mg  total) by mouth 2 (two) times daily. (Patient taking differently: Take 25 mg by mouth 2 (two) times daily. ) 60 tablet 1 Unknown at Unknown  . midodrine (PROAMATINE) 10 MG tablet Take 1 tablet (10 mg total) by mouth daily. 30 tablet 1 Unknown at Unknown  . multivitamin (RENA-VIT) TABS tablet TAKE 1 TABLET BY MOUTH EVERYDAY AT BEDTIME 30 tablet 0 Unknown at Unknown  . OLANZapine (ZYPREXA) 15 MG tablet Take 1 tablet (15 mg total) by mouth at bedtime. 30 tablet 1 Unknown at Unknown  . pantoprazole (PROTONIX) 40 MG tablet TAKE 1 TABLET EVERY DAY 90 tablet 3   . rOPINIRole (REQUIP) 2 MG tablet Take 1 tablet (2 mg total) by mouth at bedtime. (Patient taking differently: Take 2 mg by mouth daily. ) 30 tablet 1 Unknown at Unknown  . timolol (TIMOPTIC) 0.5 % ophthalmic solution Place 1 drop into both eyes 2 (two) times daily. 10 mL 1     Musculoskeletal: Strength & Muscle Tone: within normal limits Gait & Station: normal Patient leans: N/A  Psychiatric Specialty Exam: Physical Exam  Nursing note and vitals reviewed. Constitutional: She is oriented to person, place, and time. She appears well-developed and well-nourished.  HENT:  Head: Normocephalic and atraumatic.  Eyes: Pupils are equal, round, and reactive to light. Conjunctivae and EOM are normal.  Neck: Normal range of motion. Neck supple.  Cardiovascular: Normal rate, regular rhythm and normal heart sounds.  Respiratory: Effort normal and breath sounds normal.  GI: Soft. Bowel sounds are normal.  Musculoskeletal: Normal range of motion.  Neurological: She is alert and oriented to person, place, and time.  Skin: Skin is warm and dry.  Psychiatric: Her speech is normal and behavior is normal. Thought content normal. Her mood appears anxious. Cognition and memory are normal. She expresses impulsivity.    Review of Systems  Neurological: Negative.   Psychiatric/Behavioral: Negative.   All other systems reviewed and are negative.   Blood  pressure 140/80, pulse 71, temperature 97.8 F (36.6 C), temperature source Oral, resp. rate 18, height '5\' 3"'$  (1.6 m), weight 71.7 kg (158 lb), SpO2 97 %.Body mass index is 27.99 kg/m.  See SRA                                                  Sleep:  Number of Hours: 6    Treatment Plan Summary: Daily contact with patient to assess and evaluate symptoms and progress in treatment and Medication management   Ms. Speights is a 72 year old female with a history of bipolar illness and ESRD admitted for  agitation and presumed treatment noncompliance.  #Mood/psychosis -continue Tegretol 200 mg BID -continue Zyprexa 15 mg nightly -continue Haldol 2 mg BID  #Insomnia -Ativan 2 mg nightly  #UTI -continue Keflex 250 mg TID  #ESRD, on dialysis MWF -nephrology help is greatly appreciated -continue Midodrin 10 mg daily  #HTN -Metoprolol 25 mg BID  #Restless legs -continue Requip 2 mg daily  #GERD -continue Pepcid 20 mg daily and Bentyl 20 mg TID  #Metabolic syndrome monitoring -labs were obtained recently  #Disposition -discharge to home with family -follow up with Dr. Thurmond Butts on June 26 ay 4:30   Observation Level/Precautions:  15 minute checks  Laboratory:  CBC Chemistry Profile UDS UA  Psychotherapy:    Medications:    Consultations:    Discharge Concerns:    Estimated LOS:  Other:     Physician Treatment Plan for Primary Diagnosis: Bipolar I disorder, most recent episode (or current) manic ([place]) Long Term Goal(s): Improvement in symptoms so as ready for discharge  Short Term Goals: Ability to identify changes in lifestyle to reduce recurrence of condition will improve, Ability to verbalize feelings will improve, Ability to disclose and discuss suicidal ideas, Ability to demonstrate self-control will improve, Ability to identify and develop effective coping behaviors will improve, Ability to maintain clinical measurements within normal limits will  improve and Ability to identify triggers associated with substance abuse/mental health issues will improve  Physician Treatment Plan for Secondary Diagnosis: Principal Problem:   Bipolar I disorder, most recent episode (or current) manic ([place]) Active Problems:   HTN (hypertension)   ESRD on dialysis ([place])   UTI (urinary tract infection)  Long Term Goal(s): NA  Short Term Goals: NA  I certify that inpatient services furnished can reasonably be expected to improve the patient's condition.    Orson Slick, MD 5/29/201911:39 AM

## 2017-12-16 NOTE — Progress Notes (Signed)
[facility]  ROUNDING NOTE   Subjective:  Patient well-known to [place] from prior admission. Brought in for worsening depression. She is under voluntary commitment at the moment. After discharge patient went to live with her son and daughter-in-law in [place]. She has been receiving hemodialysis there. Prior to this she was under our care here locally.   Objective:  Vital signs in last 24 hours:  Temp:  [97.8 F (36.6 C)-98.9 F (37.2 C)] 97.8 F (36.6 C) (05/29 1234) Pulse Rate:  [55-71] 63 (05/29 1400) Resp:  [16-22] 21 (05/29 1400) BP: (134-160)/(52-80) 137/61 (05/29 1400) SpO2:  [97 %-100 %] 99 % (05/29 1400) Weight:  [71.7 kg (158 lb)-72 kg (158 lb 11.7 oz)] 72 kg (158 lb 11.7 oz) (05/29 1234)  Weight change:  Filed Weights   12/15/17 2223 12/16/17 1234  Weight: 71.7 kg (158 lb) 72 kg (158 lb 11.7 oz)    Intake/Output: No intake/output data recorded.   Intake/Output this shift:  Total I/O In: 360 [P.O.:360] Out: -   Physical Exam: General: No acute distress  Head: Normocephalic, atraumatic. Moist oral mucosal membranes  Eyes: Anicteric  Neck: Supple, trachea midline  Lungs:  Clear to auscultation, normal effort  Heart: S1S2 no rubs  Abdomen:  Soft, nontender, bowel sounds present  Extremities:  peripheral edema.  Neurologic: Awake, alert, following commands  Skin: No lesions  Access: LUE AVF    Basic Metabolic Panel: Recent Labs  Lab 12/14/17 2240  NA 136  K 3.3*  CL 98*  CO2 28  GLUCOSE 119*  BUN 10  CREATININE 3.03*  CALCIUM 8.8*    Liver Function Tests: Recent Labs  Lab 12/14/17 2240  AST 24  ALT 12*  ALKPHOS 68  BILITOT 0.4  PROT 6.7  ALBUMIN 3.8   No results for input(s): LIPASE, AMYLASE in the last 168 hours. No results for input(s): AMMONIA in the last 168 hours.  CBC: Recent Labs  Lab 12/14/17 2240 12/16/17 1341  WBC 7.9 7.3  NEUTROABS 6.2  --   HGB 11.8* 11.2*  HCT 34.8* 33.4*  MCV  101.8* 103.0*  PLT 252 208    Cardiac Enzymes: No results for input(s): CKTOTAL, CKMB, CKMBINDEX, TROPONINI in the last 168 hours.  BNP: Invalid input(s): POCBNP  CBG: No results for input(s): GLUCAP in the last 168 hours.  Microbiology: Results for orders placed or performed during the hospital encounter of 09/01/17  Urine culture     Status: Abnormal   Collection Time: 09/01/17  4:32 PM  Result Value Ref Range Status   Specimen Description   Final    URINE, RANDOM Performed at [facility], [address] [place], [place] [address]    Special Requests   Final    NONE Performed at [facility], [address] [place], [place] [address]    Culture MULTIPLE SPECIES PRESENT, SUGGEST RECOLLECTION (A)  Final   Report Status 09/03/2017 FINAL  Final  Culture, blood (routine x 2)     Status: None   Collection Time: 09/01/17  6:05 PM  Result Value Ref Range Status   Specimen Description BLOOD RIGHT HAND  Final   Special Requests   Final    BOTTLES DRAWN AEROBIC AND ANAEROBIC Blood Culture results may not be optimal due to an excessive volume of blood received in culture bottles   Culture   Final    NO GROWTH 5 DAYS Performed at [facility], [address] [place], [place]  [address]    Report Status 09/06/2017 FINAL  Final  Culture, blood (routine x 2)     Status: None   Collection Time: 09/01/17  6:34 PM  Result Value Ref Range Status   Specimen Description BLOOD RAC  Final   Special Requests   Final    BOTTLES DRAWN AEROBIC AND ANAEROBIC Blood Culture adequate volume   Culture   Final    NO GROWTH 5 DAYS Performed at [facility], [address] [place], [place] [address]    Report Status 09/06/2017 FINAL  Final    Coagulation Studies: No results for input(s): LABPROT, INR in the last 72 hours.  Urinalysis: Recent Labs    12/14/17 2240  COLORURINE YELLOW*  LABSPEC 1.006  PHURINE 8.0  GLUCOSEU NEGATIVE  HGBUR NEGATIVE   BILIRUBINUR NEGATIVE  KETONESUR NEGATIVE  PROTEINUR NEGATIVE  NITRITE NEGATIVE  LEUKOCYTESUR MODERATE*      Imaging: Dg Chest Port 1 View  Result Date: 12/15/2017 CLINICAL DATA:  Behavioral medicine evaluation. Patient on dialysis. EXAM: PORTABLE CHEST 1 VIEW COMPARISON:  Chest radiograph performed 09/27/2017 FINDINGS: The lungs are relatively well-aerated. Pulmonary vascularity is at the upper limits of normal. There is no evidence of focal opacification, pleural effusion or pneumothorax. The cardiomediastinal silhouette is within normal limits. No acute osseous abnormalities are seen. A left-sided vascular stent is noted extending to the left axilla. IMPRESSION: No acute cardiopulmonary process seen. Electronically Signed   By: Garald Balding M.D.   On: 12/15/2017 00:55     Medications:   . sodium chloride    . sodium chloride     . carbamazepine  200 mg Oral BID  . cephALEXin  250 mg Oral Q8H  . dicyclomine  20 mg Oral TID AC  . famotidine  20 mg Oral Daily  . haloperidol  2 mg Oral BID  . LORazepam  2 mg Oral QHS  . metoprolol tartrate  25 mg Oral BID  . midodrine  10 mg Oral Daily  . OLANZapine  15 mg Oral QHS  . rOPINIRole  2 mg Oral Daily  . timolol  1 drop Both Eyes BID   sodium chloride, sodium chloride, alteplase, alum & mag hydroxide-simeth, heparin, lidocaine (PF), lidocaine-prilocaine, magnesium hydroxide, pentafluoroprop-tetrafluoroeth  Assessment/ Plan:  72 y.o. female with end stage renal disease on hemodialysis, diverticulosis, hypotension, bipolar disorder, polycystic kidney disease   [facility]/MWF  1.  ESRD on HD.  Patient seen and evaluated during hemodialysis today.  She appears to be tolerating well.  We will continue MWF schedule while here.  2.  Anemia of chronic kidney disease.  Hemoglobin 11.2.  Hold off on Epogen at this time.  3.  Secondary hyperparathyroidism.  We plan to check serum phosphorus today.  4.  Hypotension.   Maintain the patient on midodrine 10 mg p.o. daily.  5.  Depression.  Under voluntary commitment with psychiatry as an inpatient.   LOS: 1 Sharmel [place] 5/29/20192:21 PM

## 2017-12-16 NOTE — Progress Notes (Signed)
Post HD assessment    12/16/17 1642  Neurological  Level of Consciousness Alert  Orientation Level Oriented X4  Respiratory  Respiratory Pattern Regular;Unlabored  Chest Assessment Chest expansion symmetrical  Cardiac  ECG Monitor Yes  Cardiac Rhythm SB  Vascular  R Radial Pulse +2  L Radial Pulse +2  Integumentary  Integumentary (WDL) X  Skin Color Appropriate for ethnicity  Musculoskeletal  Musculoskeletal (WDL) X  Generalized Weakness Yes  Assistive Device Wheelchair  GU Assessment  Genitourinary (WDL) X  Genitourinary Symptoms  (HD)  Psychosocial  Psychosocial (WDL) WDL  Patient Behaviors Cooperative;Calm;Appropriate for situation

## 2017-12-16 NOTE — Tx Team (Addendum)
Interdisciplinary Treatment and Diagnostic Plan Update  12/16/2017 Time of Session: 10:35 AM Joyce [name] MRN: [id number]  Principal Diagnosis: Bipolar I disorder, most recent episode (or current) manic ([place])  Secondary Diagnoses: Principal Problem:   Bipolar I disorder, most recent episode (or current) manic ([place]) Active Problems:   HTN (hypertension)   ESRD on dialysis [facility])   UTI (urinary tract infection)   Current Medications:  Current Facility-Administered Medications  Medication Dose Route Frequency Provider Last Rate Last Dose  . 0.9 %  sodium chloride infusion  100 mL Intravenous PRN Lateef, Munsoor, MD      . 0.9 %  sodium chloride infusion  100 mL Intravenous PRN Lateef, Munsoor, MD      . 0.9 %  sodium chloride infusion  100 mL Intravenous PRN Lateef, Munsoor, MD      . 0.9 %  sodium chloride infusion  100 mL Intravenous PRN Lateef, Munsoor, MD      . alteplase (CATHFLO ACTIVASE) injection 2 mg  2 mg Intracatheter Once PRN Lateef, Munsoor, MD      . alteplase (CATHFLO ACTIVASE) injection 2 mg  2 mg Intracatheter Once PRN Lateef, Munsoor, MD      . alum & mag hydroxide-simeth (MAALOX/MYLANTA) 200-200-20 MG/5ML suspension 30 mL  30 mL Oral Q4H PRN Clapacs, [name], MD      . carbamazepine (TEGRETOL) tablet 200 mg  200 mg Oral BID Clapacs, [name], MD      . cephALEXin (KEFLEX) capsule 250 mg  250 mg Oral Q8H [name], [name], MD   250 mg at 12/16/17 0647  . dicyclomine (BENTYL) tablet 20 mg  20 mg Oral TID AC Clapacs, [name], MD      . famotidine (PEPCID) tablet 20 mg  20 mg Oral Daily Clapacs, [name], MD      . haloperidol (HALDOL) tablet 2 mg  2 mg Oral BID [name], [name], MD   2 mg at 12/15/17 2332  . heparin injection 1,000 Units  1,000 Units Dialysis PRN Lateef, Munsoor, MD      . heparin injection 1,000 Units  1,000 Units Dialysis PRN Lateef, Munsoor, MD      . lidocaine (PF) (XYLOCAINE) 1 % injection 5 mL  5 mL Intradermal PRN Lateef, Munsoor, MD      .  lidocaine (PF) (XYLOCAINE) 1 % injection 5 mL  5 mL Intradermal PRN Lateef, Munsoor, MD      . lidocaine-prilocaine (EMLA) cream 1 application  1 application Topical PRN Lateef, Munsoor, MD      . LORazepam (ATIVAN) tablet 2 mg  2 mg Oral QHS [name], [name], MD   2 mg at 12/15/17 2324  . magnesium hydroxide (MILK OF MAGNESIA) suspension 30 mL  30 mL Oral Daily PRN Clapacs, [name], MD      . metoprolol tartrate (LOPRESSOR) tablet 25 mg  25 mg Oral BID Clapacs, [name], MD   25 mg at 12/16/17 0831  . midodrine (PROAMATINE) tablet 10 mg  10 mg Oral Daily Clapacs, [name], MD   10 mg at 12/16/17 1221  . OLANZapine (ZYPREXA) tablet 15 mg  15 mg Oral QHS Clapacs, [name], MD      . pentafluoroprop-tetrafluoroeth (GEBAUERS) aerosol 1 application  1 application Topical PRN Lateef, Munsoor, MD      . rOPINIRole (REQUIP) tablet 2 mg  2 mg Oral Daily [name], [name], MD      . timolol (TIMOPTIC) 0.5 % ophthalmic solution  1 drop  1 drop Both Eyes BID Clapacs, [name], MD   1 drop at 12/16/17 0830   PTA Medications: Medications Prior to Admission  Medication Sig Dispense Refill Last Dose  . acetaminophen (TYLENOL) 325 MG tablet Take 325 mg 2 (two) times daily as needed by mouth for moderate pain or headache.    Not Taking at Unknown time  . brimonidine (ALPHAGAN) 0.2 % ophthalmic solution Place 1 drop into both eyes 2 (two) times daily. 5 mL 1   . carbamazepine (TEGRETOL) 200 MG tablet Take 1 tablet (200 mg total) by mouth 2 (two) times daily. 60 tablet 1   . cholecalciferol (VITAMIN D) 1000 UNITS tablet Take 1,000 Units by mouth daily.   Not Taking at Unknown time  . cyanocobalamin (,VITAMIN B-12,) 1000 MCG/ML injection Inject 1,000 mcg into the muscle every 30 (thirty) days.   Not Taking at Unknown time  . dicyclomine (BENTYL) 20 MG tablet Take 1 tablet (20 mg total) by mouth 3 (three) times daily before meals. (Patient taking differently: Take 20 mg by mouth daily. ) 90 tablet 1 Unknown at Unknown   . epoetin alfa (EPOGEN,PROCRIT) 4000 UNIT/ML injection Inject 1 mL (4,000 Units total) into the vein every Monday, Wednesday, and Friday with hemodialysis. 1 mL 6   . famotidine (PEPCID) 20 MG tablet Take 1 tablet (20 mg total) by mouth daily. 30 tablet 1 Unknown at Unknown  . haloperidol (HALDOL) 5 MG tablet TAKE 1/2 TABLET BY MOUTH IN THE MORNING AND 1 TABLET AT BEDTIME  1 Unknown at Unknown  . lidocaine (LIDODERM) 5 % Place 1 patch onto the skin daily. Remove & Discard patch within 12 hours or as directed by MD 30 patch 0   . lidocaine-prilocaine (EMLA) cream Apply 1 application every Monday, Wednesday, and Friday topically. At dialysis  3 prn at prn  . LORazepam (ATIVAN) 2 MG tablet Take 1 tablet (2 mg total) by mouth at bedtime. 30 tablet 0 Unknown at Unknown  . metoprolol tartrate (LOPRESSOR) 25 MG tablet Take 0.5 tablets (12.5 mg total) by mouth 2 (two) times daily. (Patient taking differently: Take 25 mg by mouth 2 (two) times daily. ) 60 tablet 1 Unknown at Unknown  . midodrine (PROAMATINE) 10 MG tablet Take 1 tablet (10 mg total) by mouth daily. 30 tablet 1 Unknown at Unknown  . multivitamin (RENA-VIT) TABS tablet TAKE 1 TABLET BY MOUTH EVERYDAY AT BEDTIME 30 tablet 0 Unknown at Unknown  . OLANZapine (ZYPREXA) 15 MG tablet Take 1 tablet (15 mg total) by mouth at bedtime. 30 tablet 1 Unknown at Unknown  . pantoprazole (PROTONIX) 40 MG tablet TAKE 1 TABLET EVERY DAY 90 tablet 3   . rOPINIRole (REQUIP) 2 MG tablet Take 1 tablet (2 mg total) by mouth at bedtime. (Patient taking differently: Take 2 mg by mouth daily. ) 30 tablet 1 Unknown at Unknown  . timolol (TIMOPTIC) 0.5 % ophthalmic solution Place 1 drop into both eyes 2 (two) times daily. 10 mL 1     Patient Stressors: Financial difficulties Occupational concerns  Patient Strengths: Motivation for treatment/growth Physical Health Supportive family/friends  Treatment Modalities: Medication Management, Group therapy, Case management,   1 to 1 session with clinician, Psychoeducation, Recreational therapy.   Physician Treatment Plan for Primary Diagnosis: Bipolar I disorder, most recent episode (or current) manic ([place]) Long Term Goal(s): Improvement in symptoms so as ready for discharge NA   Short Term Goals: Ability to identify changes in lifestyle to reduce recurrence  of condition will improve Ability to verbalize feelings will improve Ability to disclose and discuss suicidal ideas Ability to demonstrate self-control will improve Ability to identify and develop effective coping behaviors will improve Ability to maintain clinical measurements within normal limits will improve Ability to identify triggers associated with substance abuse/mental health issues will improve NA  Medication Management: Evaluate patient's response, side effects, and tolerance of medication regimen.  Therapeutic Interventions: 1 to 1 sessions, Unit Group sessions and Medication administration.  Evaluation of Outcomes: Progressing  Physician Treatment Plan for Secondary Diagnosis: Principal Problem:   Bipolar I disorder, most recent episode (or current) manic ([place]) Active Problems:   HTN (hypertension)   ESRD on dialysis ([place])   UTI (urinary tract infection)  Long Term Goal(s): Improvement in symptoms so as ready for discharge NA   Short Term Goals: Ability to identify changes in lifestyle to reduce recurrence of condition will improve Ability to verbalize feelings will improve Ability to disclose and discuss suicidal ideas Ability to demonstrate self-control will improve Ability to identify and develop effective coping behaviors will improve Ability to maintain clinical measurements within normal limits will improve Ability to identify triggers associated with substance abuse/mental health issues will improve NA     Medication Management: Evaluate patient's response, side effects, and tolerance of medication regimen.  Therapeutic  Interventions: 1 to 1 sessions, Unit Group sessions and Medication administration.  Evaluation of Outcomes: Progressing   RN Treatment Plan for Primary Diagnosis: Bipolar I disorder, most recent episode (or current) manic ([place]) Long Term Goal(s): Knowledge of disease and therapeutic regimen to maintain health will improve  Short Term Goals: Ability to remain free from injury will improve, Ability to identify and develop effective coping behaviors will improve and Compliance with prescribed medications will improve  Medication Management: RN will administer medications as ordered by provider, will assess and evaluate patient's response and provide education to patient for prescribed medication. RN will report any adverse and/or side effects to prescribing provider.  Therapeutic Interventions: 1 on 1 counseling sessions, Psychoeducation, Medication administration, Evaluate responses to treatment, Monitor vital signs and CBGs as ordered, Perform/monitor CIWA, COWS, AIMS and Fall Risk screenings as ordered, Perform wound care treatments as ordered.  Evaluation of Outcomes: Progressing   LCSW Treatment Plan for Primary Diagnosis: Bipolar I disorder, most recent episode (or current) manic ([place]) Long Term Goal(s): Safe transition to appropriate next level of care at discharge, Engage patient in therapeutic group addressing interpersonal concerns.  Short Term Goals: Engage patient in aftercare planning with referrals and resources and Increase skills for wellness and recovery  Therapeutic Interventions: Assess for all discharge needs, 1 to 1 time with Social worker, Explore available resources and support systems, Assess for adequacy in community support network, Educate family and significant other(s) on suicide prevention, Complete Psychosocial Assessment, Interpersonal group therapy.  Evaluation of Outcomes: Progressing   Progress in Treatment: Attending groups: No. Participating in groups:  No. Taking medication as prescribed: Yes. Toleration medication: No. Family/Significant other contact made: No, will contact:  CSW will contact identified support person when given consent. Patient understands diagnosis: Yes. Discussing patient identified problems/goals with staff: Yes. Medical problems stabilized or resolved: Yes. Denies suicidal/homicidal ideation: Yes. Issues/concerns per patient self-inventory: No. Other: n/a  New problem(s) identified: Yes, Describe:  Pt was been dx with a UTI  New Short Term/Long Term Goal(s):  Patient Goals:  "to get so upset with wanting to go back to me home and my daughter-in-law not wanting to go to  church with me"  Discharge Plan or Barriers: Tentative discharge is for pt to return back to her son's home with follow-up services to resume with Dr. Thurmond Butts.  Reason for Continuation of Hospitalization: Anxiety Medication stabilization  Estimated Length of Stay: 3-5 days  Recreational Therapy: Patient Stressors: N/A  Patient Goal: Patient will engage in groups without prompting or encouragement from LRT x3 group sessions within 5 recreation therapy group sessions  Attendees: Patient: Joyce [name] 12/16/2017 3:37 PM  Physician: Orson Slick, MD 12/16/2017 3:37 PM  Nursing: Polly Cobia, RN 12/16/2017 3:37 PM  RN Care Manager: 12/16/2017 3:37 PM  Social Worker: Derrek Gu, LCSW 12/16/2017 3:37 PM  Recreational Therapist: Roanna Epley, LRT 12/16/2017 3:37 PM  Other: Darin Engels, [place] 12/16/2017 3:37 PM  Other:  12/16/2017 3:37 PM  Other: 12/16/2017 3:37 PM    Scribe for Treatment Team: Devona Konig, LCSW 12/16/2017 3:37 PM

## 2017-12-16 NOTE — Progress Notes (Signed)
Patient ID: Joyce [name], female   DOB: April 05, 1946, 72 y.o.   MRN: [id number] CSW attempted to meet with pt twice today to begin discharge planning which will include completing the PSA, SPE, and obtaining consents.  The first visit found pt in the bathroom and unable to meet with CSW and the second visit found pt away from the BMU due to engaging in dialysis. CSW will attend to meet with pt on Thursday, May 30.

## 2017-12-16 NOTE — BHH Suicide Risk Assessment (Signed)
[facility] Admission Suicide Risk Assessment   Nursing information obtained from:  Patient Demographic factors:  Age 72 or older Current Mental Status:  NA Loss Factors:  Decline in physical health Historical Factors:  NA Risk Reduction Factors:  Positive therapeutic relationship  Total Time spent with patient: 1 hour Principal Problem: Bipolar I disorder, most recent episode (or current) manic ([place]) Diagnosis:   Patient Active Problem List   Diagnosis Date Noted  . Bipolar affective disorder, current episode depressed with psychotic symptoms (Joyce [name]) [F31.2] 09/17/2017    Priority: High  . Bipolar I disorder, most recent episode (or current) manic ([place]) [F31.10] 09/02/2017    Priority: High  . UTI (urinary tract infection) [N39.0] 12/16/2017  . Bipolar 1 disorder, depressed ([place]) [F31.9] 12/15/2017  . Palliative care encounter [Z51.5]   . Acute delirium [R41.0] 09/02/2017  . Altered mental status [R41.82]   . Acute encephalopathy [G93.40] 09/01/2017  . ESRD on dialysis ([place]) [N18.6, Z99.2] 04/06/2017  . Complication of vascular access for dialysis [T82.9XXA] 04/06/2017  . Hematuria [R31.9] 12/18/2016  . Polycystic kidney [Q61.3] 10/12/2016  . Ruptured cyst of kidney [Q61.00] 10/03/2016  . HTN (hypertension) [I10] 10/01/2016  . Chronic kidney disease [N18.9] 10/01/2016  . Anemia associated with chronic renal failure [N18.9, D63.1] 09/24/2016  . Acute kidney insufficiency [N28.9] 07/28/2016  . Noninfectious diarrhea [K52.9]   . Benign neoplasm of cecum [D12.0]   . Benign neoplasm of ascending colon [D12.2]   . Diarrhea [R19.7]   . Nausea [R11.0]   . Gastric polyp [K31.7]   . Chronic constipation [K59.09] 12/27/2014  . Nausea with vomiting [R11.2] 12/27/2014  . Left sided abdominal pain [R10.9] 12/27/2014  . Personal history of colonic polyps [Z86.010] 05/10/2013   Subjective Data: psychosis  Continued Clinical Symptoms:  Alcohol Use Disorder Identification Test Final Score  (AUDIT): 3 The "Alcohol Use Disorders Identification Test", Guidelines for Use in Primary Care, Second Edition.  World Pharmacologist [facility]). Score between 0-7:  no or low risk or alcohol related problems. Score between 8-15:  moderate risk of alcohol related problems. Score between 16-19:  high risk of alcohol related problems. Score 20 or above:  warrants further diagnostic evaluation for alcohol dependence and treatment.   CLINICAL FACTORS:   Severe Anxiety and/or Agitation Bipolar Disorder:   Mixed State Previous Psychiatric Diagnoses and Treatments Medical Diagnoses and Treatments/Surgeries   Musculoskeletal: Strength & Muscle Tone: within normal limits Gait & Station: normal Patient leans: N/A  Psychiatric Specialty Exam: Physical Exam  Nursing note and vitals reviewed. Psychiatric: Her speech is normal and behavior is normal. Thought content normal. Her mood appears anxious. Cognition and memory are normal. She expresses impulsivity.    Review of Systems  Neurological: Negative.   Psychiatric/Behavioral: Negative.   All other systems reviewed and are negative.   Blood pressure 140/80, pulse 71, temperature 97.8 F (36.6 C), temperature source Oral, resp. rate 18, height 5\' 3"  (1.6 m), weight 71.7 kg (158 lb), SpO2 97 %.Body mass index is 27.99 kg/m.  General Appearance: Casual  Eye Contact:  Good  Speech:  Clear and Coherent  Volume:  Normal  Mood:  Anxious  Affect:  Labile  Thought Process:  Goal Directed and Descriptions of Associations: Intact  Orientation:  Full (Time, Place, and Person)  Thought Content:  WDL  Suicidal Thoughts:  No  Homicidal Thoughts:  No  Memory:  Immediate;   Fair Recent;   Fair Remote;   Fair  Judgement:  Impaired  Insight:  Shallow  Psychomotor Activity:  Normal  Concentration:  Concentration: Fair and Attention Span: Fair  Recall:  [employer] of Knowledge:  Fair  Language:  Fair  Akathisia:  No  Handed:  Right  AIMS (if  indicated):     Assets:  Communication Skills Desire for Improvement Financial Resources/Insurance Housing Resilience Social Support  ADL's:  Intact  Cognition:  WNL  Sleep:  Number of Hours: 6      COGNITIVE FEATURES THAT CONTRIBUTE TO RISK:  None    SUICIDE RISK:   Minimal: No identifiable suicidal ideation.  Patients presenting with no risk factors but with morbid ruminations; may be classified as minimal risk based on the severity of the depressive symptoms  PLAN OF CARE: hospital admission, medication management, discharge planning.   [name] is a 72 year old female with a history of bipolar illness and ESRD admitted for agitation and presumed treatment noncompliance.  #Mood/psychosis -continue Tegretol 200 mg BID -continue Zyprexa 15 mg nightly -continue Haldol 2 mg BID  #Insomnia -Ativan 2 mg nightly  #UTI -continue Keflex 250 mg TID  #ESRD, on dialysis MWF -nephrology help is greatly appreciated -continue Midodrin 10 mg daily  #HTN -Metoprolol 25 mg BID  #Restless legs -continue Requip 2 mg daily  #GERD -continue Pepcid 20 mg daily and Bentyl 20 mg TID  #Metabolic syndrome monitoring -labs were obtained recently  #Disposition -discharge to home with family -follow up with Dr. Thurmond Butts on June 26 ay 2:70    I certify that inpatient services furnished can reasonably be expected to improve the patient's condition.   Orson Slick, MD 12/16/2017, 11:16 AM

## 2017-12-16 NOTE — Plan of Care (Signed)
Patient has the ability to identify effective coping behavior as well as the ability to interact with other members on the unit without any issues at this time. Patient has the ability to make decisions for herself and has used fair eye contact when communicating with this Probation officer. Patient has verbalized understanding and has been in compliance with her prescribed medication regimen. Patient has the  ability to manage her health related needs and maintain adequate nutrition. Patient rates her level of anxiety a "2/10", but can not express to this writer why she is feeling this way. Patient verbalizes understanding of the general information that has been provided to her and all questions/concerns have been addressed and answered at this time. Patient has not experienced any health-related complications thus far. Patient's overall level of comfort has improved and she has remained free from injury thus far on the unit. Patient's risk for impaired skin integrity has decreased and patient remains safe on the unit at this time.  Problem: Coping: Goal: Ability to identify and develop effective coping behavior will improve Outcome: Progressing Goal: Ability to interact with others will improve Outcome: Progressing Goal: Demonstration of participation in decision-making regarding own care will improve Outcome: Progressing Goal: Ability to use eye contact when communicating with others will improve Outcome: Progressing   Problem: Education: Goal: Utilization of techniques to improve thought processes will improve Outcome: Progressing Goal: Knowledge of the prescribed therapeutic regimen will improve Outcome: Progressing   Problem: Health Behavior/Discharge Planning: Goal: Ability to make decisions will improve Outcome: Progressing Goal: Compliance with therapeutic regimen will improve Outcome: Progressing   Problem: Education: Goal: Knowledge of General Education information will improve Outcome:  Progressing   Problem: Health Behavior/Discharge Planning: Goal: Ability to manage health-related needs will improve Outcome: Progressing   Problem: Activity: Goal: Risk for activity intolerance will decrease Outcome: Progressing   Problem: Nutrition: Goal: Adequate nutrition will be maintained Outcome: Progressing   Problem: Coping: Goal: Level of anxiety will decrease Outcome: Progressing   Problem: Elimination: Goal: Will not experience complications related to bowel motility Outcome: Progressing Goal: Will not experience complications related to urinary retention Outcome: Progressing   Problem: Pain Managment: Goal: General experience of comfort will improve Outcome: Progressing   Problem: Safety: Goal: Ability to remain free from injury will improve Outcome: Progressing   Problem: Skin Integrity: Goal: Risk for impaired skin integrity will decrease Outcome: Progressing

## 2017-12-16 NOTE — Progress Notes (Signed)
HD tx start    12/16/17 1250  Vital Signs  Pulse Rate (!) 59  Pulse Rate Source Monitor  Resp 18  BP (!) 145/57  BP Location Right Arm  BP Method Automatic  Patient Position (if appropriate) Lying  Oxygen Therapy  SpO2 99 %  O2 Device Room Air  During Hemodialysis Assessment  Blood Flow Rate (mL/min) 300 mL/min  Arterial Pressure (mmHg) -240 mmHg  Venous Pressure (mmHg) 130 mmHg  Transmembrane Pressure (mmHg) 70 mmHg  Ultrafiltration Rate (mL/min) 570 mL/min  Dialysate Flow Rate (mL/min) 800 ml/min  Conductivity: Machine  15.4  HD Safety Checks Performed Yes  Dialysis Fluid Bolus Normal Saline  Bolus Amount (mL) 250 mL  Intra-Hemodialysis Comments Tx initiated

## 2017-12-16 NOTE — [facility] Group Notes (Signed)
LCSW Group Therapy Note  12/16/2017 1:00 pm  Type of Therapy/Topic:  Group Therapy:  Emotion Regulation  Participation Level:  Did Not Attend   Description of Group:    The purpose of this group is to assist patients in learning to regulate negative emotions and experience positive emotions. Patients will be guided to discuss ways in which they have been vulnerable to their negative emotions. These vulnerabilities will be juxtaposed with experiences of positive emotions or situations, and patients will be challenged to use positive emotions to combat negative ones. Special emphasis will be placed on coping with negative emotions in conflict situations, and patients will process healthy conflict resolution skills.  Therapeutic Goals: 1. Patient will identify two positive emotions or experiences to reflect on in order to balance out negative emotions 2. Patient will label two or more emotions that they find the most difficult to experience 3. Patient will demonstrate positive conflict resolution skills through discussion and/or role plays  Summary of Patient Progress: Joyce [name] was invited to today's group, but was unable to attend due to having dialysis.      Therapeutic Modalities:   Cognitive Behavioral Therapy Feelings Identification Dialectical Behavioral Therapy

## 2017-12-16 NOTE — Progress Notes (Signed)
Pre HD assessment    12/16/17 1234  Vital Signs  Temp 97.8 F (36.6 C)  Temp Source Oral  Pulse Rate (!) 59  Pulse Rate Source Monitor  Resp 19  BP (!) 134/54  BP Location Right Arm  BP Method Automatic  Patient Position (if appropriate) Lying  Oxygen Therapy  SpO2 99 %  O2 Device Room Air  Pain Assessment  Pain Scale 0-10  Pain Score 0  Dialysis Weight  Weight 72 kg (158 lb 11.7 oz)  Type of Weight Pre-Dialysis  Time-Out for Hemodialysis  What Procedure? HD  Pt Identifiers(min of two) First/Last Name;MRN/Account#  Correct Site? Yes  Correct Side? Yes  Correct Procedure? Yes  Consents Verified? Yes  Rad Studies Available? N/A  Safety Precautions Reviewed? Yes  Engineer, civil (consulting) Number  (5A)  Station Number 3  UF/Alarm Test Passed  Conductivity: Meter 13.8  Conductivity: Machine  14  pH 7.6  Reverse Osmosis main  Normal Saline Lot Number [id number]  Dialyzer Lot Number [id number]  Disposable Set Lot Number [id number]  Machine Temperature 98.6 F (37 C)  Musician and Audible Yes  Blood Lines Intact and Secured Yes  Pre Treatment Patient Checks  Vascular access used during treatment Fistula  Hepatitis B Surface Antigen Results  (unk, results out dated, labs drawn today)  Hepatitis B Surface Antibody  (<10)  Date Hepatitis B Surface Antibody Drawn 09/18/17  Hemodialysis Consent Verified Yes  Hemodialysis Standing Orders Initiated Yes  ECG (Telemetry) Monitor On Yes  Prime Ordered Normal Saline  Length of  DialysisTreatment -hour(s) 3.5 Hour(s)  Dialyzer Elisio 17H NR  Dialysate 2K, 2.5 Ca  Dialysis Anticoagulant None  Dialysate Flow Ordered 800  Blood Flow Rate Ordered 400 mL/min  Ultrafiltration Goal 1.5 Liters  Pre Treatment Labs Hepatitis B Surface Antigen;Renal panel;CBC  Dialysis Blood Pressure Support Ordered Normal Saline  Education / Care Plan  Dialysis Education Provided Yes  Documented Education in Care Plan Yes

## 2017-12-16 NOTE — Tx Team (Signed)
Initial Treatment Plan 12/16/2017 1:52 AM Joyce [name] EBR:[id number]    PATIENT STRESSORS: Financial difficulties Occupational concerns   PATIENT STRENGTHS: Motivation for treatment/growth Physical Health Supportive family/friends   PATIENT IDENTIFIED PROBLEMS: Bipolar    Dialysis MWF    Depression/Anxiety             DISCHARGE CRITERIA:  Improved stabilization in mood, thinking, and/or behavior Medical problems require only outpatient monitoring Motivation to continue treatment in a less acute level of care Reduction of life-threatening or endangering symptoms to within safe limits  PRELIMINARY DISCHARGE PLAN: Attend 12-step recovery group Outpatient therapy Participate in family therapy Placement in alternative living arrangements  PATIENT/FAMILY INVOLVEMENT: This treatment plan has been presented to and reviewed with the patient, Joyce [name], .  The patient have been given the opportunity to ask questions and make suggestions.  Clemens Catholic, RN 12/16/2017, 1:52 AM

## 2017-12-16 NOTE — Progress Notes (Signed)
HD tx end   12/16/17 1635  Vital Signs  Pulse Rate (!) 56  Pulse Rate Source Monitor  Resp 17  BP (!) 147/59  BP Location Right Arm  BP Method Automatic  Patient Position (if appropriate) Lying  Oxygen Therapy  SpO2 100 %  O2 Device Room Air  During Hemodialysis Assessment  Dialysis Fluid Bolus Normal Saline  Bolus Amount (mL) 250 mL  Intra-Hemodialysis Comments Tx completed

## 2017-12-16 NOTE — Progress Notes (Cosign Needed)
New admit with voluntary commitment,  known to [place] from previous encounter with this unit, on this admission patient present with depression/ anxiety and stated that she is unstable due to bipolar with manic episodes, patient is ESRD with dialysis MWF , fistula on left upper arm, patient makes urine and no fluid restriction at this time,patient denies any SI/HI and no signs of AVH. Patient is thought safety and nutrition and understood information presented to her, skin checks and body search  is done by Alexis/bukola RNs, skin is clean and no  Contraband was found., patient is in room 310, 15 minute safety rounding is maintained, no distress.

## 2017-12-16 NOTE — Progress Notes (Signed)
D- Patient alert and oriented. Patient presents in a sad, but pleasant mood on assessment stating to this writer that she slept "good" last night. Patient rates her level of anxiety a "2/10", but can not state to this writer why she is feeling this way. Patient denies SI, HI, AVH, and pain at this time. Patient also denies any signs/symptoms of depression at this time. Patient's goal for today is "to get better".  A- Scheduled medications administered to patient, per MD orders. Support and encouragement provided.  Routine safety checks conducted every 15 minutes.  Patient informed to notify staff with problems or concerns.  R- No adverse drug reactions noted. Patient contracts for safety at this time. Patient compliant with medications and treatment plan. Patient receptive, calm, and cooperative. Patient interacts well with others on the unit.  Patient remains safe at this time.

## 2017-12-16 NOTE — Progress Notes (Signed)
Recreation Therapy Notes  Date: 12/16/2017  Time: 9:30 am  Location: Craft Room  Behavioral response: Appropriate  Intervention Topic: Problem Solving  Discussion/Intervention:  Group content on today was focused on problem solving. The group described what problem solving is. Patients expressed how problems affects them and how they deal with problems. Individuals identified healthy ways to deal with problems. Patients explained what normally happens to them when they do not deal with problems. The group expressed reoccurring problems for them. The group participated in the intervention "Put the story together" with their peers and worked together to put a story that was broken up in the correct order. Clinical Observations/Feedback:  Patient came to group and was focused on what her peers and staff had to say about problem solving. Individual participated in the intervention and was social with peers and staff during group. Kirtis Challis LRT/CTRS         Somara Frymire 12/16/2017 12:23 PM

## 2017-12-16 NOTE — Progress Notes (Signed)
Pre HD assessment    12/16/17 1235  Neurological  Level of Consciousness Alert  Orientation Level Oriented X4  Respiratory  Respiratory Pattern Regular;Unlabored  Chest Assessment Chest expansion symmetrical  Cardiac  ECG Monitor Yes  Cardiac Rhythm SB  Vascular  R Radial Pulse +2  L Radial Pulse +2  Integumentary  Integumentary (WDL) X  Skin Color Appropriate for ethnicity  Musculoskeletal  Musculoskeletal (WDL) X  Generalized Weakness Yes  Assistive Device Wheelchair  GU Assessment  Genitourinary (WDL) X  Genitourinary Symptoms  (HD)  Psychosocial  Psychosocial (WDL) WDL  Patient Behaviors Calm;Cooperative;Appropriate for situation  Needs Expressed Physical

## 2017-12-17 LAB — HEPATITIS B SURFACE ANTIGEN: HEP B S AG: NEGATIVE

## 2017-12-17 MED ORDER — BRIMONIDINE TARTRATE 0.2 % OP SOLN: 1.0000 [drp] | Freq: Three times a day (TID) | OPHTHALMIC | Status: DC

## 2017-12-17 MED ORDER — BRIMONIDINE TARTRATE 0.2 % OP SOLN
1.0000 [drp] | Freq: Two times a day (BID) | OPHTHALMIC | Status: DC
  Administered 2017-12-17 – 2017-12-23 (×14): 1 [drp] via OPHTHALMIC
  Filled 2017-12-17: qty 5

## 2017-12-17 MED ORDER — ROPINIROLE HCL 1 MG PO TABS
2.0000 mg | ORAL_TABLET | Freq: Every day | ORAL | Status: DC
  Administered 2017-12-17 – 2017-12-23 (×7): 2 mg via ORAL
  Filled 2017-12-17 (×8): qty 2

## 2017-12-17 MED ORDER — ACETAMINOPHEN 325 MG PO TABS
325.0000 mg | ORAL_TABLET | Freq: Four times a day (QID) | ORAL | Status: DC | PRN
  Administered 2017-12-17 – 2017-12-19 (×2): 325 mg via ORAL
  Filled 2017-12-17 (×2): qty 1

## 2017-12-17 MED ORDER — CARBAMAZEPINE 200 MG PO TABS
200.0000 mg | ORAL_TABLET | Freq: Three times a day (TID) | ORAL | Status: DC
  Administered 2017-12-17 – 2017-12-23 (×15): 200 mg via ORAL
  Filled 2017-12-17 (×15): qty 1

## 2017-12-17 NOTE — Progress Notes (Signed)
Recreation Therapy Notes  INPATIENT RECREATION THERAPY ASSESSMENT  Patient Details Name: Joyce [name] MRN: [id number] DOB: [birth year] Today's Date: 12/17/2017       Information Obtained From: Patient  Able to Participate in Assessment/Interview: Yes  Patient Presentation: Responsive  Reason for Admission (Per Patient): Active Symptoms  Patient Stressors:    Coping Skills:   Music  Leisure Interests (2+):  Music - Singing, Individual - TV  Frequency of Recreation/Participation: Monthly  Awareness of Community Resources:  Yes  Community Resources:  Recreation Center  Current Use: Yes  If no, Barriers?:    Expressed Interest in [place] of Residence:  Person  Patient Main Form of Transportation: Other (Comment)(Family takes me around)  Patient Strengths:  N/A  Patient Identified Areas of Improvement:  Be more outgoing  Patient Goal for Hospitalization:  Have more self-esteem and help people  Current SI (including self-harm):  No  Current HI:  No  Current AVH: No  Staff Intervention Plan: Group Attendance, Collaborate with Interdisciplinary Treatment Team  Consent to Intern Participation: N/A  Tigran Haynie 12/17/2017, 3:44 PM

## 2017-12-17 NOTE — Progress Notes (Signed)
1:1 Patient Hourly Rounding  0700: Patient is in her room asleep with her assigned sitter.  0800: Patient is asleep in her room with her assigned safety sitter.  0900: Patient is asleep in her room with her assigned safety sitter.  1000: Patient is in the medication room with her nurse and assigned safety sitter.  1100: Patient is in bed with her assigned safety sitter present.  1200: Patient is in the dayroom eating lunch with her assigned MHT.  1300: Patient is headed to unit group with her assigned Air cabin crew.

## 2017-12-17 NOTE — Progress Notes (Signed)
Patient ID: Joyce [name], female   DOB: 04-01-1946, 72 y.o.   MRN: [id number] Mood and Affect appropriate, engaged in constructive conversation, coherent, logical; ambulating around without dysfunction or assistive device; denied pain, remains on falls precautions with cameral surveillance from the nurses' station.

## 2017-12-17 NOTE — Progress Notes (Signed)
1:1 Safety Sitter maintained; patient remains awake in bed tossing and turning.

## 2017-12-17 NOTE — Progress Notes (Signed)
D- Patient alert and oriented. Patient presents in a pleasant mood on assessment stating that she didn't sleep last night. Patient has no major complaints at this time. Patient rates her anxiety a "1/10", but does not know why she is feeling anxious. Patient denies SI, HI, AVH, and pain at this time. Patient also denies any signs/symptoms of depression at this time. Patient's goal for today is to "be able to go home".  A- Scheduled medications administered to patient, per MD orders. Support and encouragement provided.  Routine safety checks conducted every 15 minutes.  Patient informed to notify staff with problems or concerns.  R- No adverse drug reactions noted. Patient contracts for safety at this time. Patient compliant with medications and treatment plan. Patient receptive, calm, and cooperative. Patient interacts well with others on the unit.  Patient remains safe at this time.

## 2017-12-17 NOTE — Progress Notes (Signed)
Patient still awake, turning and tossing, remains on 1:1 Safety Sitter.

## 2017-12-17 NOTE — [facility] Group Notes (Signed)
LCSW Group Therapy Note  12/17/2017 1:00 pm  Type of Therapy/Topic:  Group Therapy:  Balance in Life  Participation Level:  None  Description of Group:    This group will address the concept of balance and how it feels and looks when one is unbalanced. Patients will be encouraged to process areas in their lives that are out of balance and identify reasons for remaining unbalanced. Facilitators will guide patients in utilizing problem-solving interventions to address and correct the stressor making their life unbalanced. Understanding and applying boundaries will be explored and addressed for obtaining and maintaining a balanced life. Patients will be encouraged to explore ways to assertively make their unbalanced needs known to significant others in their lives, using other group members and facilitator for support and feedback.  Therapeutic Goals: 1. Patient will identify two or more emotions or situations they have that consume much of in their lives. 2. Patient will identify signs/triggers that life has become out of balance:  3. Patient will identify two ways to set boundaries in order to achieve balance in their lives:  4. Patient will demonstrate ability to communicate their needs through discussion and/or role plays  Summary of Patient Progress: Joyce [name] did not chose to engage in any of the group discussions on balance in life. She stayed the entire session, but did not speak at all.     Therapeutic Modalities:   Cognitive Behavioral Therapy Solution-Focused Therapy Assertiveness Training  Joyce [name], [place] 12/17/2017 4:33 PM

## 2017-12-17 NOTE — Progress Notes (Signed)
Awake, "where is my knock-out medications..." Patient was reminded that Ativan 2 mg was given already. Remains on 1:1 Safety Sitter.

## 2017-12-17 NOTE — Progress Notes (Signed)
Patient remains on 1:1 Air cabin crew

## 2017-12-17 NOTE — Progress Notes (Signed)
Recreation Therapy Notes   Date: 12/17/2017  Time: 9:30 am   Location: Craft Room   Behavioral response: N/A   Intervention Topic:  Communication  Discussion/Intervention: Patient did not attend group.   Clinical Observations/Feedback:  Patient did not attend group.   Chey Cho LRT/CTRS        Rally Ouch 12/17/2017 10:38 AM

## 2017-12-17 NOTE — [facility] Group Notes (Signed)
LCSW Group Therapy Note 12/17/2017 9:00 AM  Type of Therapy and Topic:  Group Therapy:  Setting Goals  Participation Level:  Did Not Attend  Description of Group: In this process group, patients discussed using strengths to work toward goals and address challenges.  Patients identified two positive things about themselves and one goal they were working on.  Patients were given the opportunity to share openly and support each other's plan for self-empowerment.  The group discussed the value of gratitude and were encouraged to have a daily reflection of positive characteristics or circumstances.  Patients were encouraged to identify a plan to utilize their strengths to work on current challenges and goals.  Therapeutic Goals 1. Patient will verbalize personal strengths/positive qualities and relate how these can assist with achieving desired personal goals 2. Patients will verbalize affirmation of peers plans for personal change and goal setting 3. Patients will explore the value of gratitude and positive focus as related to successful achievement of goals 4. Patients will verbalize a plan for regular reinforcement of personal positive qualities and circumstances.  Summary of Patient Progress: Joyce [name] was invited to today's group, but chose not to attend.      Therapeutic Modalities Cognitive Behavioral Therapy Motivational Interviewing    Devona Konig, [place] 12/17/2017 3:04 PM

## 2017-12-17 NOTE — Plan of Care (Signed)
Patient has the ability to identify effective coping behavior as well as the ability to interact with other members on the unit without any issues at this time. Patient can make decisions for herself and has used fair eye contact when communicating with this Probation officer. Patient has verbalized understanding and has been in compliance with her prescribed medication regimen. Patient has the ability to manage her health related needs and maintain adequate nutrition by consuming at least half of her meals daily. Patient rates her level of anxiety a "1/10", but can not express to this writer why she is feeling this way. Patient verbalizes understanding of the general information that has been provided to her and all questions/concerns have been addressed and answered at this time. Patient has not experienced any health-related complications thus far on the unit. Patient's overall level of comfort has improved and she has remained free from injury on the unit. Patient's risk for impaired skin integrity has decreased and patient remains safe at this time.  Problem: Coping: Goal: Ability to identify and develop effective coping behavior will improve Outcome: Progressing Goal: Ability to interact with others will improve Outcome: Progressing Goal: Demonstration of participation in decision-making regarding own care will improve Outcome: Progressing Goal: Ability to use eye contact when communicating with others will improve Outcome: Progressing   Problem: Education: Goal: Utilization of techniques to improve thought processes will improve Outcome: Progressing Goal: Knowledge of the prescribed therapeutic regimen will improve Outcome: Progressing   Problem: Health Behavior/Discharge Planning: Goal: Ability to make decisions will improve Outcome: Progressing Goal: Compliance with therapeutic regimen will improve Outcome: Progressing   Problem: Education: Goal: Knowledge of General Education information will  improve Outcome: Progressing   Problem: Health Behavior/Discharge Planning: Goal: Ability to manage health-related needs will improve Outcome: Progressing   Problem: Activity: Goal: Risk for activity intolerance will decrease Outcome: Progressing   Problem: Nutrition: Goal: Adequate nutrition will be maintained Outcome: Progressing   Problem: Coping: Goal: Level of anxiety will decrease Outcome: Progressing   Problem: Elimination: Goal: Will not experience complications related to bowel motility Outcome: Progressing Goal: Will not experience complications related to urinary retention Outcome: Progressing   Problem: Pain Managment: Goal: General experience of comfort will improve Outcome: Progressing   Problem: Safety: Goal: Ability to remain free from injury will improve Outcome: Progressing   Problem: Skin Integrity: Goal: Risk for impaired skin integrity will decrease Outcome: Progressing

## 2017-12-17 NOTE — Progress Notes (Signed)
[facility]  ROUNDING NOTE   Subjective:  Patient seen at bedside. Doing better. Quite interactive today. Due for dialysis in tomorrow.  Objective:  Vital signs in last 24 hours:  Temp:  [97.7 F (36.5 C)-97.9 F (36.6 C)] 97.9 F (36.6 C) (05/29 2329) Pulse Rate:  [56-71] 71 (05/30 1009) Resp:  [17-20] 18 (05/30 0606) BP: (91-154)/(45-59) 123/55 (05/30 1009) SpO2:  [96 %-100 %] 96 % (05/30 0606)  Weight change: 0.332 kg (11.7 oz) Filed Weights   12/15/17 2223 12/16/17 1234  Weight: 71.7 kg (158 lb) 72 kg (158 lb 11.7 oz)    Intake/Output: I/O last 3 completed shifts: In: 600 [P.O.:600] Out: 1536 [Other:1536]   Intake/Output this shift:  Total I/O In: 120 [P.O.:120] Out: -   Physical Exam: General: No acute distress  Head: Normocephalic, atraumatic. Moist oral mucosal membranes  Eyes: Anicteric  Neck: Supple, trachea midline  Lungs:  Clear to auscultation, normal effort  Heart: S1S2 no rubs  Abdomen:  Soft, nontender, bowel sounds present  Extremities: No peripheral edema.  Neurologic: Awake, alert, following commands  Skin: No lesions  Access: LUE AVF    Basic Metabolic Panel: Recent Labs  Lab 12/14/17 2240 12/16/17 1341 12/16/17 1719  NA 136 138 140  K 3.3* 3.2* 3.1*  CL 98* 98* 96*  CO2 28 27 32  GLUCOSE 119* 129* 116*  BUN 10 21* 7  CREATININE 3.03* 4.92* 2.27*  CALCIUM 8.8* 9.0 9.2  PHOS  --  4.3 2.3*    Liver Function Tests: Recent Labs  Lab 12/14/17 2240 12/16/17 1341 12/16/17 1719  AST 24  --   --   ALT 12*  --   --   ALKPHOS 68  --   --   BILITOT 0.4  --   --   PROT 6.7  --   --   ALBUMIN 3.8 3.9 4.4   No results for input(s): LIPASE, AMYLASE in the last 168 hours. No results for input(s): AMMONIA in the last 168 hours.  CBC: Recent Labs  Lab 12/14/17 2240 12/16/17 1341 12/16/17 1719  WBC 7.9 7.3 6.6  NEUTROABS 6.2  --   --   HGB 11.8* 11.2* 13.0  HCT 34.8* 33.4* Joyce.0  MCV 101.8* 103.0* 103.0*  PLT  252 208 234    Cardiac Enzymes: No results for input(s): CKTOTAL, CKMB, CKMBINDEX, TROPONINI in the last 168 hours.  BNP: Invalid input(s): POCBNP  CBG: Recent Labs  Lab 12/16/17 2329  GLUCAP 115*    Microbiology: Results for orders placed or performed during the hospital encounter of 09/01/17  Urine culture     Status: Abnormal   Collection Time: 09/01/17  4:32 PM  Result Value Ref Range Status   Specimen Description   Final    URINE, RANDOM Performed at [facility], [address] [place], [place] [address]    Special Requests   Final    NONE Performed at [facility], [address] [place], [place] [address]    Culture MULTIPLE SPECIES PRESENT, SUGGEST RECOLLECTION (A)  Final   Report Status 09/03/2017 FINAL  Final  Culture, blood (routine x 2)     Status: None   Collection Time: 09/01/17  6:05 PM  Result Value Ref Range Status   Specimen Description BLOOD RIGHT HAND  Final   Special Requests   Final    BOTTLES DRAWN AEROBIC AND ANAEROBIC Blood Culture results may not be optimal due to an excessive volume of blood received  in culture bottles   Culture   Final    NO GROWTH 5 DAYS Performed at [facility], [address] [place], [place] [address]    Report Status 09/06/2017 FINAL  Final  Culture, blood (routine x 2)     Status: None   Collection Time: 09/01/17  6:34 PM  Result Value Ref Range Status   Specimen Description BLOOD RAC  Final   Special Requests   Final    BOTTLES DRAWN AEROBIC AND ANAEROBIC Blood Culture adequate volume   Culture   Final    NO GROWTH 5 DAYS Performed at [facility], [address] [place], [place] [address]    Report Status 09/06/2017 FINAL  Final    Coagulation Studies: No results for input(s): LABPROT, INR in the last 72 hours.  Urinalysis: Recent Labs    12/14/17 2240  COLORURINE YELLOW*  LABSPEC 1.006  PHURINE 8.0  GLUCOSEU NEGATIVE  HGBUR NEGATIVE  BILIRUBINUR  NEGATIVE  KETONESUR NEGATIVE  PROTEINUR NEGATIVE  NITRITE NEGATIVE  LEUKOCYTESUR MODERATE*      Imaging: No results found.   Medications:   . sodium chloride    . sodium chloride    . sodium chloride    . sodium chloride     . brimonidine  1 drop Both Eyes BID  . carbamazepine  200 mg Oral TID  . cephALEXin  250 mg Oral Q8H  . dicyclomine  20 mg Oral TID AC  . famotidine  20 mg Oral Daily  . haloperidol  2 mg Oral BID  . LORazepam  2 mg Oral QHS  . metoprolol tartrate  25 mg Oral BID  . midodrine  10 mg Oral Daily  . OLANZapine  15 mg Oral QHS  . rOPINIRole  2 mg Oral Daily  . timolol  1 drop Both Eyes BID   sodium chloride, sodium chloride, sodium chloride, sodium chloride, acetaminophen, alteplase, alteplase, alum & mag hydroxide-simeth, heparin, heparin, lidocaine (PF), lidocaine (PF), lidocaine-prilocaine, magnesium hydroxide, pentafluoroprop-tetrafluoroeth  Assessment/ Plan:  72 y.o. Joyce [name] with end stage renal disease on hemodialysis, diverticulosis, hypotension, bipolar disorder, polycystic kidney disease   [facility]/MWF  1.  ESRD on HD.  Patient completed dialysis yesterday.  No acute indication for dialysis today.  We will plan for dialysis for tomorrow.  2.  Anemia chronic kidney disease.  Hemoglobin was above target previously.  Hold off on Epogen for now.  3.  SHPTH.  Serum phosphorus was 2.3 at last check.  Recheck tomorrow.  4.  Hypotension.  Maintain the patient on midodrine.  5.  Depression.  Management as per psychiatry..   LOS: 2 Lorren Splawn 5/30/20193:49 PM

## 2017-12-17 NOTE — Progress Notes (Signed)
Patient ID: Joyce [name], female   DOB: [birth year], 72 y.o.   MRN: [id number] Mood and affect, initially at the beginning of the shift, was bright, jovial, appropriate, A&Ox3, ambulating without assistive device, neat appearance, able to communicate that the HD went well. Medications (Ativan 2 mg and Zyprexa 15 mg) given as ordered, retired to room. Patient came back out 2250 seeking "I need my restless leg medication, I need my eye drop, the purple top, I need Tylenol for pain" Based on chart review, Requip tablet 2 mg was not given at 1745 and was given at 2256. After she was given additional snack but observed barely holding on to the side rails and was quickly assited floor, SBP=91/52: fluid pushed, HR=56 Non sustaining, return to NSR instantly;., CBG=115.PTA reconciled with Dr. Mamie Nick , Alphagan 0.2% and Tylenol 325 added to regimen and given; Observation level raised to 1:1 Safety Sitter per Dr. Mamie Nick (VO/RBO). Rolling walker provided

## 2017-12-17 NOTE — Progress Notes (Signed)
[facility] MD Progress Note  12/17/2017 4:06 PM Tinie Mcgloin  MRN:  [id number]  Subjective:    Ms. Shadowens was doing very well all day long yesterday and participated in treatment team and completed dialysis. In the evening, she became restless and demanding, then weak, than insomniac and slept 1 hour only. She was complainuinmg of restless legs and pain. She ended up sitting on the floor without a fall. She had a sitter overnight. She is better today, ambulating without problems, eating well. She has been irritable, short and easily distracted. She seems not to listen or pay attention and I have to ask the same question several times. She has "an edge".   Spoke extensively with [name], her daughter-in-law. 1. Family does not think bipolar is under control and the patient is easily agitated, argumentative and unreasonable. She sleeps 11-12 hours a day and hallucinates. 2. Her son became new HCPOA. The patient is adamant about NOT EVER GETTING ECT. This has to do with memory loss.  3. The patient has been doing exceedingly well with dialysis and this treatment is acceptable.    Principal Problem: Bipolar I disorder, most recent episode (or current) manic ([place]) Diagnosis:   Patient Active Problem List   Diagnosis Date Noted  . Bipolar affective disorder, current episode depressed with psychotic symptoms ([place]) [F31.2] 09/17/2017    Priority: High  . Bipolar I disorder, most recent episode (or current) manic ([place]) [F31.10] 09/02/2017    Priority: High  . UTI (urinary tract infection) [N39.0] 12/16/2017  . Bipolar 1 disorder, depressed ([place]) [F31.9] 12/15/2017  . Palliative care encounter [Z51.5]   . Acute delirium [R41.0] 09/02/2017  . Altered mental status [R41.82]   . Acute encephalopathy [G93.40] 09/01/2017  . ESRD on dialysis ([place]) [N18.6, Z99.2] 04/06/2017  . Complication of vascular access for dialysis [T82.9XXA] 04/06/2017  . Hematuria [R31.9] 12/18/2016  . Polycystic kidney [Q61.3]  10/12/2016  . Ruptured cyst of kidney [Q61.00] 10/03/2016  . HTN (hypertension) [I10] 10/01/2016  . Chronic kidney disease [N18.9] 10/01/2016  . Anemia associated with chronic renal failure [N18.9, D63.1] 09/24/2016  . Acute kidney insufficiency [N28.9] 07/28/2016  . Noninfectious diarrhea [K52.9]   . Benign neoplasm of cecum [D12.0]   . Benign neoplasm of ascending colon [D12.2]   . Diarrhea [R19.7]   . Nausea [R11.0]   . Gastric polyp [K31.7]   . Chronic constipation [K59.09] 12/27/2014  . Nausea with vomiting [R11.2] 12/27/2014  . Left sided abdominal pain [R10.9] 12/27/2014  . Personal history of colonic polyps [Z86.010] 05/10/2013   Total Time spent with patient: 45 minutes  Past Psychiatric History: bipolar disorder  Past Medical History:  Past Medical History:  Diagnosis Date  . Anal fissure   . Bipolar affective disorder ([place])   . CKD (chronic kidney disease)    Dr Holley Raring [name] 4  . Colon polyps   . Diverticulitis   . Diverticulitis   . Family history of adverse reaction to anesthesia    mom - PONV  . GERD (gastroesophageal reflux disease)   . Headache    migraines - none over 10 yrs  . Heart murmur   . History of hiatal hernia   . Hypertension   . Pancreatitis    Valproic acid  . Vertigo     Past Surgical History:  Procedure Laterality Date  . A/V FISTULAGRAM Left 04/14/2017   Procedure: A/V Fistulagram;  Surgeon: Katha Cabal, MD;  Location: [place] [facility];  Service: Cardiovascular;  Laterality:  Left;  . A/V FISTULAGRAM Left 06/09/2017   Procedure: A/V FISTULAGRAM;  Surgeon: Katha Cabal, MD;  Location: [place] [facility];  Service: Cardiovascular;  Laterality: Left;  . A/V FISTULAGRAM Left 11/02/2017   Procedure: A/V FISTULAGRAM;  Surgeon: Algernon Huxley, MD;  Location: [place] [facility];  Service: Cardiovascular;  Laterality: Left;  . A/V SHUNT INTERVENTION N/A 11/02/2017   Procedure: A/V SHUNT INTERVENTION;  Surgeon: Algernon Huxley,  MD;  Location: [place] [facility];  Service: Cardiovascular;  Laterality: N/A;  . ABDOMINAL HYSTERECTOMY  1990 ?  . AV FISTULA PLACEMENT  4/30  . BREAST EXCISIONAL BIOPSY Left 1994   neg surgical bx  . CHOLECYSTECTOMY  2003  . COLONOSCOPY  2014   Dr. Jamal Collin  . COLONOSCOPY WITH PROPOFOL N/A 09/24/2015   Procedure: COLONOSCOPY WITH random colon byopies.;  Surgeon: Lucilla Lame, MD;  Location: [place];  Service: Endoscopy;  Laterality: N/A;  . ESOPHAGOGASTRODUODENOSCOPY (EGD) WITH PROPOFOL N/A 09/24/2015   Procedure: ESOPHAGOGASTRODUODENOSCOPY (EGD) ;  Surgeon: Lucilla Lame, MD;  Location: [place];  Service: Endoscopy;  Laterality: N/A;  . EYE SURGERY    . PERIPHERAL VASCULAR CATHETERIZATION N/A 05/29/2015   Procedure: A/V Shuntogram/Fistulagram;  Surgeon: Katha Cabal, MD;  Location: [place] [facility];  Service: Cardiovascular;  Laterality: N/A;  . PERIPHERAL VASCULAR CATHETERIZATION N/A 05/29/2015   Procedure: A/V Shunt Intervention;  Surgeon: Katha Cabal, MD;  Location: [place] [facility];  Service: Cardiovascular;  Laterality: N/A;  . POLYPECTOMY  09/24/2015   Procedure: POLYPECTOMY INTESTINAL;  Surgeon: Lucilla Lame, MD;  Location: [place];  Service: Endoscopy;;  cecal polyp ascending polyp   Family History:  Family History  Problem Relation Age of Onset  . Stroke Father   . Hypertension Father   . Breast cancer Other   . Colon cancer Neg Hx   . Liver disease Neg Hx    Family Psychiatric  History: none Social History:  Social History   Substance and Sexual Activity  Alcohol Use No  . Alcohol/week: 0.0 oz     Social History   Substance and Sexual Activity  Drug Use No    Social History   Socioeconomic History  . Marital status: Single    Spouse name: Not on file  . Number of children: 1  . Years of education: Not on file  . Highest education level: Not on file  Occupational History  . Occupation: Surveyor, quantity:  [employer]  Social Needs  . Financial resource strain: Not on file  . Food insecurity:    Worry: Not on file    Inability: Not on file  . Transportation needs:    Medical: Not on file    Non-medical: Not on file  Tobacco Use  . Smoking status: Never Smoker  . Smokeless tobacco: Never Used  Substance and Sexual Activity  . Alcohol use: No    Alcohol/week: 0.0 oz  . Drug use: No  . Sexual activity: Not Currently  Lifestyle  . Physical activity:    Days per week: Not on file    Minutes per session: Not on file  . Stress: Not on file  Relationships  . Social connections:    Talks on phone: Not on file    Gets together: Not on file    Attends religious service: Not on file    Active member of club or organization: Not on file    Attends meetings of  clubs or organizations: Not on file    Relationship status: Not on file  Other Topics Concern  . 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   Additional Social History:                         Sleep: Poor  Appetite:  Poor  Current Medications: Current Facility-Administered Medications  Medication Dose Route Frequency Provider Last Rate Last Dose  . 0.9 %  sodium chloride infusion  100 mL Intravenous PRN Lateef, Munsoor, MD      . 0.9 %  sodium chloride infusion  100 mL Intravenous PRN Lateef, Munsoor, MD      . 0.9 %  sodium chloride infusion  100 mL Intravenous PRN Lateef, Munsoor, MD      . 0.9 %  sodium chloride infusion  100 mL Intravenous PRN Lateef, Munsoor, MD      . acetaminophen (TYLENOL) tablet 325 mg  325 mg Oral Q6H PRN Lou Irigoyen B, MD   325 mg at 12/17/17 0021  . alteplase (CATHFLO ACTIVASE) injection 2 mg  2 mg Intracatheter Once PRN Lateef, Munsoor, MD      . alteplase (CATHFLO ACTIVASE) injection 2 mg  2 mg Intracatheter Once PRN Lateef, Munsoor, MD      . alum & mag hydroxide-simeth (MAALOX/MYLANTA)  [phone] MG/5ML suspension 30 mL  30 mL Oral Q4H PRN Clapacs, [name], MD      . brimonidine (ALPHAGAN) 0.2 % ophthalmic solution 1 drop  1 drop Both Eyes BID Zakkary Thibault B, MD   1 drop at 12/17/17 1004  . carbamazepine (TEGRETOL) tablet 200 mg  200 mg Oral TID Alaira Level B, MD   200 mg at 12/17/17 1404  . cephALEXin (KEFLEX) capsule 250 mg  250 mg Oral Q8H Eloise Picone B, MD   250 mg at 12/17/17 1404  . dicyclomine (BENTYL) tablet 20 mg  20 mg Oral TID AC Clapacs, [name], MD   20 mg at 12/17/17 1404  . famotidine (PEPCID) tablet 20 mg  20 mg Oral Daily Clapacs, [name], MD   20 mg at 12/17/17 1002  . haloperidol (HALDOL) tablet 2 mg  2 mg Oral BID Najwa Spillane B, MD   2 mg at 12/17/17 1002  . heparin injection 1,000 Units  1,000 Units Dialysis PRN Lateef, Munsoor, MD      . heparin injection 1,000 Units  1,000 Units Dialysis PRN Lateef, Munsoor, MD      . lidocaine (PF) (XYLOCAINE) 1 % injection 5 mL  5 mL Intradermal PRN Lateef, Munsoor, MD      . lidocaine (PF) (XYLOCAINE) 1 % injection 5 mL  5 mL Intradermal PRN Lateef, Munsoor, MD      . lidocaine-prilocaine (EMLA) cream 1 application  1 application Topical PRN Lateef, Munsoor, MD      . LORazepam (ATIVAN) tablet 2 mg  2 mg Oral QHS Shylah Dossantos B, MD   2 mg at 12/16/17 2118  . magnesium hydroxide (MILK OF MAGNESIA) suspension 30 mL  30 mL Oral Daily PRN Clapacs, [name], MD      . metoprolol tartrate (LOPRESSOR) tablet 25 mg  25 mg Oral BID Clapacs, [name], MD   25 mg at 12/16/17 0831  . midodrine (PROAMATINE) tablet 10 mg  10 mg Oral Daily Clapacs, [name], MD   10  mg at 12/17/17 1004  . OLANZapine (ZYPREXA) tablet 15 mg  15 mg Oral QHS Clapacs, [name], MD   15 mg at 12/16/17 2118  . pentafluoroprop-tetrafluoroeth (GEBAUERS) aerosol 1 application  1 application Topical PRN Lateef, Munsoor, MD      . rOPINIRole (REQUIP) tablet 2 mg  2 mg Oral Daily Yvetta Drotar B, MD      . timolol (TIMOPTIC) 0.5 %  ophthalmic solution 1 drop  1 drop Both Eyes BID Clapacs, [name], MD   1 drop at 12/17/17 1005    Lab Results:  Results for orders placed or performed during the hospital encounter of 12/15/17 (from the past 48 hour(s))  Carbamazepine level, total     Status: Abnormal   Collection Time: 12/16/17  6:32 AM  Result Value Ref Range   Carbamazepine Lvl 3.7 (L) 4.0 - 12.0 ug/mL    Comment: Performed at [facility], [address] [place], [place] [address]  Renal function panel     Status: Abnormal   Collection Time: 12/16/17  1:41 PM  Result Value Ref Range   Sodium 138 135 - 145 mmol/L   Potassium 3.2 (L) 3.5 - 5.1 mmol/L   Chloride 98 (L) 101 - 111 mmol/L   CO2 27 22 - 32 mmol/L   Glucose, Bld 129 (H) 65 - 99 mg/dL   BUN 21 (H) 6 - 20 mg/dL   Creatinine, Ser 4.92 (H) 0.44 - 1.00 mg/dL   Calcium 9.0 8.9 - 10.3 mg/dL   Phosphorus 4.3 2.5 - 4.6 mg/dL   Albumin 3.9 3.5 - 5.0 g/dL   GFR calc non Af Amer 8 (L) >60 mL/min   GFR calc Af Amer 9 (L) >60 mL/min    Comment: (NOTE) The eGFR has been calculated using the CKD EPI equation. This calculation has not been validated in all clinical situations. eGFR's persistently <60 mL/min signify possible Chronic Kidney Disease.    Anion gap 13 5 - 15    Comment: Performed at [facility], [address] [place], [place] [address]  CBC     Status: Abnormal   Collection Time: 12/16/17  1:41 PM  Result Value Ref Range   WBC 7.3 3.6 - 11.0 K/uL   RBC 3.24 (L) 3.80 - 5.20 MIL/uL   Hemoglobin 11.2 (L) 12.0 - 16.0 g/dL   HCT 33.4 (L) 35.0 - 47.0 %   MCV 103.0 (H) 80.0 - 100.0 fL   MCH 34.5 (H) 26.0 - 34.0 pg   MCHC 33.5 32.0 - 36.0 g/dL   RDW 15.8 (H) 11.5 - 14.5 %   Platelets 208 150 - 440 K/uL    Comment: Performed at [facility], [address] [place], [place] [address]  Hepatitis B surface antigen     Status: None   Collection Time: 12/16/17  1:43 PM  Result Value Ref Range   Hepatitis B Surface Ag  Negative Negative    Comment: (NOTE) Performed At: [facility] [place], [place] [id number] Rush Farmer MD [id number] Performed at [facility], [address] [place], [place] [address]   Renal function panel     Status: Abnormal   Collection Time: 12/16/17  5:19 PM  Result Value Ref Range   Sodium 140 135 - 145 mmol/L   Potassium 3.1 (L) 3.5 - 5.1 mmol/L   Chloride 96 (L) 101 - 111 mmol/L   CO2 32 22 - 32 mmol/L   Glucose, Bld 116 (H)  65 - 99 mg/dL   BUN 7 6 - 20 mg/dL   Creatinine, Ser 2.27 (H) 0.44 - 1.00 mg/dL   Calcium 9.2 8.9 - 10.3 mg/dL   Phosphorus 2.3 (L) 2.5 - 4.6 mg/dL   Albumin 4.4 3.5 - 5.0 g/dL   GFR calc non Af Amer 21 (L) >60 mL/min   GFR calc Af Amer 24 (L) >60 mL/min    Comment: (NOTE) The eGFR has been calculated using the CKD EPI equation. This calculation has not been validated in all clinical situations. eGFR's persistently <60 mL/min signify possible Chronic Kidney Disease.    Anion gap 12 5 - 15    Comment: Performed at [facility], [address] [place], [place] [address]  CBC     Status: Abnormal   Collection Time: 12/16/17  5:19 PM  Result Value Ref Range   WBC 6.6 3.6 - 11.0 K/uL   RBC 3.68 (L) 3.80 - 5.20 MIL/uL   Hemoglobin 13.0 12.0 - 16.0 g/dL   HCT 38.0 35.0 - 47.0 %   MCV 103.0 (H) 80.0 - 100.0 fL   MCH 35.4 (H) 26.0 - 34.0 pg   MCHC 34.4 32.0 - 36.0 g/dL   RDW 16.1 (H) 11.5 - 14.5 %   Platelets 234 150 - 440 K/uL    Comment: Performed at [facility], [address] [place], [place] [address]  Glucose, capillary     Status: Abnormal   Collection Time: 12/16/17 11:29 PM  Result Value Ref Range   Glucose-Capillary 115 (H) 65 - 99 mg/dL    Blood Alcohol level:  Lab Results  Component Value Date   ETH <10 12/14/2017   ETH <10 54/00/8676    Metabolic Disorder Labs: Lab Results  Component Value Date   HGBA1C 4.8 09/18/2017   MPG 91.06 09/18/2017   No results  found for: PROLACTIN Lab Results  Component Value Date   CHOL 251 (H) 09/18/2017   TRIG 124 09/18/2017   HDL 66 09/18/2017   CHOLHDL 3.8 09/18/2017   VLDL 25 09/18/2017   LDLCALC 160 (H) 09/18/2017   LDLCALC 43 05/14/2014    Physical Findings: AIMS: Facial and Oral Movements Muscles of Facial Expression: None, normal Lips and Perioral Area: None, normal Jaw: None, normal Tongue: None, normal,Extremity Movements Upper (arms, wrists, hands, fingers): None, normal Lower (legs, knees, ankles, toes): None, normal, Trunk Movements Neck, shoulders, hips: None, normal, Overall Severity Severity of abnormal movements (highest score from questions above): None, normal Incapacitation due to abnormal movements: None, normal Patient's awareness of abnormal movements (rate only patient's report): No Awareness, Dental Status Current problems with teeth and/or dentures?: No Does patient usually wear dentures?: No  CIWA:  CIWA-Ar Total: 2 COWS:  COWS Total Score: 2  Musculoskeletal: Strength & Muscle Tone: within normal limits Gait & Station: normal Patient leans: N/A  Psychiatric Specialty Exam: Physical Exam  Nursing note and vitals reviewed. Psychiatric: Her speech is normal and behavior is normal. Her mood appears anxious. Thought content is paranoid. Cognition and memory are impaired. She expresses impulsivity.    Review of Systems  Neurological: Negative.   Psychiatric/Behavioral: The patient is nervous/anxious and has insomnia.   All other systems reviewed and are negative.   Blood pressure (!) 123/55, pulse 71, temperature 97.9 F (36.6 C), temperature source Oral, resp. rate 18, height '5\' 3"'$  (1.6 m), weight 72 kg (158 lb 11.7 oz), SpO2 96 %.Body mass index is 28.12 kg/m.  General Appearance: Casual  Eye  Contact:  Good  Speech:  Clear and Coherent  Volume:  Normal  Mood:  Irritable  Affect:  Congruent  Thought Process:  Goal Directed and Descriptions of Associations:  Intact  Orientation:  Full (Time, Place, and Person)  Thought Content:  Paranoid Ideation  Suicidal Thoughts:  No  Homicidal Thoughts:  No  Memory:  Immediate;   Poor Recent;   Poor Remote;   Poor  Judgement:  Impaired  Insight:  Shallow  Psychomotor Activity:  Normal  Concentration:  Concentration: Poor and Attention Span: Poor  Recall:  Poor  Fund of Knowledge:  Poor  Language:  Fair  Akathisia:  No  Handed:  Right  AIMS (if indicated):     Assets:  Communication Skills Desire for Improvement Financial Resources/Insurance Resilience Social Support  ADL's:  Intact  Cognition:  WNL  Sleep:  Number of Hours: 1     Treatment Plan Summary: Daily contact with patient to assess and evaluate symptoms and progress in treatment and Medication management   Ms. Schweigert is a 72 year old female with a history of bipolar illness and ESRD admitted for agitation and presumed treatment noncompliance.  #Mood/psychosis -continue Tegretol 200 mg BID -continue Zyprexa 15 mg nightly -continue Haldol 2 mg BID  #Insomnia -Ativan 2 mg nightly  #UTI -continue Keflex 250 mg TID  #ESRD, on dialysis MWF -nephrology help is greatly appreciated -continue Midodrin 10 mg daily  #HTN -Metoprolol 25 mg BID  #Restless legs -continue Requip 2 mg daily  #GERD -continue Pepcid 20 mg daily and Bentyl 20 mg TID  #Metabolic syndrome monitoring -labs were obtained recently  #Disposition -discharge to home with family -follow up with Dr. Thurmond Butts on June 26 ay 4:30    Orson Slick, MD 12/17/2017, 4:06 PM

## 2017-12-17 NOTE — [facility] Counselor (Signed)
Adult Comprehensive Assessment  Patient ID: Joyce [name], female   DOB: Nov 23, 1945, 72 y.o.   MRN: [id number]  Information Source: Information source: Patient(Also review of PSA completed on 09/19/17)  Current Stressors:  Patient states their primary concerns and needs for treatment are:: "stop worrying about living with my son and daughter-in-law.  I am also worried about getting the hospital bills paid". Patient states their goals for this hospitilization and ongoing recovery are:: "Get my meds upped which has happened and I'm really happy" Educational / Learning stressors: None noted Employment / Job issues: Pt does not currently work.  She stopped working in January 2019. Family Relationships: Pt is very close with her son, daughter-in-law, sister, grandchildren Museum/gallery curator / Lack of resources (include bankruptcy): Pt shared that although she receives social security benefits, it is not enough to pay all her bills like the hospital bills that were incurred in March 2019 and this current hospitalization. Housing / Lack of housing: Housing is stable Physical health (include injuries & life threatening diseases): Pt has ESRD (Stage 5).  She currently received dialysis 3xweek Social relationships: Pt shared that her social relationships are limited and mostly includes her family Substance abuse: None noted Bereavement / Loss: No current bereavement issues.    Living/Environment/Situation:  Living Arrangements: Children(Pt lives in the home of her son and daughter-in-law) Living conditions (as described by patient or guardian): "It is good.  My son and daughter-in-law take really good care of me". Who else lives in the home?: No one How long has patient lived in current situation?: since April 2019 What is atmosphere in current home: Comfortable, Quarry manager, Supportive  Family History:  Marital status: Divorced Divorced, when?: 1982 What types of issues is patient dealing with in the  relationship?: None Additional relationship information: None Are you sexually active?: No What is your sexual orientation?: Heterosexual Has your sexual activity been affected by drugs, alcohol, medication, or emotional stress?: No Does patient have children?: Yes How many children?: 1 How is patient's relationship with their children?: Pt is very close with her only son.  She is currently living with him and his wife.    Childhood History:  By whom was/is the patient raised?: Both parents Additional childhood history information: None Description of patient's relationship with caregiver when they were a child: "fanastic" Patient's description of current relationship with people who raised him/her: Pt's mother is 45 yo and her father is deceased How were you disciplined when you got in trouble as a child/adolescent?: "a loving correction" Does patient have siblings?: Yes Number of Siblings: 4 Description of patient's current relationship with siblings: 3 brothers are deceased.  She was close to all her brothers.  She has 1 sister whom she has a good relationship with.  Her sister is her Press photographer Did patient suffer any verbal/emotional/physical/sexual abuse as a child?: No Did patient suffer from severe childhood neglect?: No Has patient ever been sexually abused/assaulted/raped as an adolescent or adult?: No Was the patient ever a victim of a crime or a disaster?: No Witnessed domestic violence?: No Has patient been effected by domestic violence as an adult?: No  Education:  Highest grade of school patient has completed: 58 (some college) Currently a Ship broker?: No Learning disability?: No  Employment/Work Situation:   Employment situation: Retired Archivist job has been impacted by current illness: No What is the longest time patient has a held a job?: 30 years Where was the patient employed at that time?: [employer]  Did You Receive Any Psychiatric  Treatment/Services While in the [place]?: No Are There Guns or Other Weapons in [place]?: No Are These Weapons Safely Secured?: (No reported weapons in the home.)  Financial Resources:   Financial resources: Medicare(Pt receives Fish farm manager and retirement benefits) Does patient have a Programmer, applications or guardian?: No  Alcohol/Substance Abuse:   What has been your use of drugs/alcohol within the last 12 months?: None If attempted suicide, did drugs/alcohol play a role in this?: No Alcohol/Substance Abuse Treatment Hx: Denies past history If yes, describe treatment: n/a Has alcohol/substance abuse ever caused legal problems?: No  Social Support System:   Pensions consultant Support System: Good Describe Community Support System: Pt's primary community system are family members to include her sister and cousin Type of faith/religion: Baptist How does patient's faith help to cope with current illness?: "pray and go to church"  Leisure/Recreation:   Leisure and Hobbies: singing, reading, spending time with grandchildren (have 3 grands and 1 great-grand)  Strengths/Needs:   What is the patient's perception of their strengths?: "cleaning the house, keeping things organized" Patient states they can use these personal strengths during their treatment to contribute to their recovery: "to stay busy and active" Patient states these barriers may affect/interfere with their treatment: "having all these UTIs which often makes me crazy" Patient states these barriers may affect their return to the community: "Not now as everything has cleared up" Other important information patient would like considered in planning for their treatment: None noted  Discharge Plan:   Currently receiving community mental health services: Yes (From Whom)(Dr. Lew Dawes, MD) Patient states concerns and preferences for aftercare planning are: "Continue working with Dr. Thurmond Butts.  Continues are my medicines may  stop working and I will be right back here" Patient states they will know when they are safe and ready for discharge when: "I feel that I am ready now because my medicines are working.  I'm more at peace with returning to my son and daughter-in-law's home. They have been taking good care of me." Does patient have access to transportation?: Yes Does patient have financial barriers related to discharge medications?: No Patient description of barriers related to discharge medications: None noted Will patient be returning to same living situation after discharge?: Yes  Summary/Recommendations:   Summary and Recommendations (to be completed by the evaluator): Pt is a 72 yo female living in [place], [place] ([place]) who has a long history of Bipolar Disorder.  Pt was brought to the ED by her son and daughter-in-law who were concerned that pt's recent behavior had become unsafe.  Pt had an increase in irritability and anxiety and was being non-compliant with her medications.  Pt also complained of not getting along with her daughter-in-law whom she felt was too rigid with her rules and treated her like a child.  Pt has been living with her son and his wife since April 2019 which was also the last time she was inpatient in the hospital.  Pt denied having SI/HI, depression, and did not present with any psychosis.  Pt has received ECT in the past which brought her to her baseline and works with a Nature conservation officer.  Pt would benefit from crisis stabilization, medication management, encouragement of attendance and participation in groups, and psychoeducation.  Pt's discharge plan is for her to return to her son and daughter-in-law's home  and resume outpatient services with her psychiatrist.  Devona Konig, LCSW 12/17/2017

## 2017-12-17 NOTE — Plan of Care (Signed)
Patient slept for Estimated Hours of 1; Safety Sitter 1:1 maintained for safety, room free of safety hazards, patient sustains no injury or falls during this shift.  Problem: Coping: Goal: Ability to interact with others will improve Outcome: Progressing   Problem: Health Behavior/Discharge Planning: Goal: Compliance with therapeutic regimen will improve Outcome: Progressing   Problem: Coping: Goal: Level of anxiety will decrease Outcome: Progressing   Problem: Safety: Goal: Ability to remain free from injury will improve Outcome: Progressing

## 2017-12-17 NOTE — Progress Notes (Signed)
Awake in bed, remains on 1:1 Safety Sitter

## 2017-12-17 NOTE — Progress Notes (Signed)
1:1 Continuous Safety Sitter maintained for safety.

## 2017-12-18 ENCOUNTER — Other Ambulatory Visit: Payer: Self-pay | Admitting: Psychiatry

## 2017-12-18 DIAGNOSIS — N186 End stage renal disease: Secondary | ICD-10-CM | POA: Diagnosis not present

## 2017-12-18 LAB — PHOSPHORUS: Phosphorus: 5.2 mg/dL — ABNORMAL HIGH (ref 2.5–4.6)

## 2017-12-18 MED ORDER — HALOPERIDOL 5 MG PO TABS
5.0000 mg | ORAL_TABLET | Freq: Two times a day (BID) | ORAL | Status: DC
  Administered 2017-12-18: 5 mg via ORAL
  Filled 2017-12-18: qty 1

## 2017-12-18 NOTE — Progress Notes (Signed)
D- Patient alert and oriented to person, place, and situation. Patient presents in a slightly confused state on assessment stating to this writer that she isn't going to Dialysis today because today is Thursday". This Probation officer along with other staff reoriented patient. Patient later came back and stated this writer "today is Friday". Patient denies SI, HI, AVH, and pain at this time. Patient also denies any signs/symptoms of depression/anxiety. Patient's goal for today is "going home".  A- Scheduled medications administered to patient, per MD orders. Support and encouragement provided.  Routine safety checks conducted every 15 minutes.  Patient informed to notify staff with problems or concerns.  R- No adverse drug reactions noted. Patient contracts for safety at this time. Patient compliant with medications and treatment plan. Patient receptive, calm, and cooperative. Patient interacts well with others on the unit.  Patient remains safe at this time.

## 2017-12-18 NOTE — Progress Notes (Signed)
Hd completed 

## 2017-12-18 NOTE — Progress Notes (Signed)
Hd started  

## 2017-12-18 NOTE — Progress Notes (Signed)
[facility]  ROUNDING NOTE   Subjective:  Patient due for her regularly scheduled hemodialysis today. Orders have been prepared. She appears to be in good spirits at the moment.  Objective:  Vital signs in last 24 hours:  Temp:  [97.5 F (36.4 C)-98.8 F (37.1 C)] 97.5 F (36.4 C) (05/31 1210) Pulse Rate:  [61-92] 65 (05/31 1315) Resp:  [13-26] 13 (05/31 1315) BP: (88-131)/(42-61) 95/48 (05/31 1315) SpO2:  [97 %-100 %] 97 % (05/31 1210)  Weight change:  Filed Weights   12/15/17 2223 12/16/17 1234  Weight: 71.7 kg (158 lb) 72 kg (158 lb 11.7 oz)    Intake/Output: I/O last 3 completed shifts: In: 840 [P.O.:840] Out: -    Intake/Output this shift:  Total I/O In: 600 [P.O.:600] Out: -   Physical Exam: General: No acute distress  Head: Normocephalic, atraumatic. Moist oral mucosal membranes  Eyes: Anicteric  Neck: Supple, trachea midline  Lungs:  Clear to auscultation, normal effort  Heart: S1S2 no rubs  Abdomen:  Soft, nontender, bowel sounds present  Extremities: No peripheral edema.  Neurologic: Awake, alert, following commands  Skin: No lesions  Access: LUE AVF    Basic Metabolic Panel: Recent Labs  Lab 12/14/17 2240 12/16/17 1341 12/16/17 1719 12/18/17 1235  NA 136 138 140  --   K 3.3* 3.2* 3.1*  --   CL 98* 98* 96*  --   CO2 28 27 32  --   GLUCOSE 119* 129* 116*  --   BUN 10 21* 7  --   CREATININE 3.03* 4.92* 2.27*  --   CALCIUM 8.8* 9.0 9.2  --   PHOS  --  4.3 2.3* 5.2*    Liver Function Tests: Recent Labs  Lab 12/14/17 2240 12/16/17 1341 12/16/17 1719  AST 24  --   --   ALT 12*  --   --   ALKPHOS 68  --   --   BILITOT 0.4  --   --   PROT 6.7  --   --   ALBUMIN 3.8 3.9 4.4   No results for input(s): LIPASE, AMYLASE in the last 168 hours. No results for input(s): AMMONIA in the last 168 hours.  CBC: Recent Labs  Lab 12/14/17 2240 12/16/17 1341 12/16/17 1719  WBC 7.9 7.3 6.6  NEUTROABS 6.2  --   --   HGB 11.8*  11.2* 13.0  HCT 34.8* 33.4* 38.0  MCV 101.8* 103.0* 103.0*  PLT 252 208 234    Cardiac Enzymes: No results for input(s): CKTOTAL, CKMB, CKMBINDEX, TROPONINI in the last 168 hours.  BNP: Invalid input(s): POCBNP  CBG: Recent Labs  Lab 12/16/17 2329  GLUCAP 115*    Microbiology: Results for orders placed or performed during the hospital encounter of 09/01/17  Urine culture     Status: Abnormal   Collection Time: 09/01/17  4:32 PM  Result Value Ref Range Status   Specimen Description   Final    URINE, RANDOM Performed at [facility], [address], [place], [place] [address]    Special Requests   Final    NONE Performed at [facility], [address] [place], [place] [address]    Culture MULTIPLE SPECIES PRESENT, SUGGEST RECOLLECTION (A)  Final   Report Status 09/03/2017 FINAL  Final  Culture, blood (routine x 2)     Status: None   Collection Time: 09/01/17  6:05 PM  Result Value Ref Range Status   Specimen Description BLOOD RIGHT HAND  Final   Special Requests   Final    BOTTLES DRAWN AEROBIC AND ANAEROBIC Blood Culture results may not be optimal due to an excessive volume of blood received in culture bottles   Culture   Final    NO GROWTH 5 DAYS Performed at [facility], [address], [place], [place] [address]    Report Status 09/06/2017 FINAL  Final  Culture, blood (routine x 2)     Status: None   Collection Time: 09/01/17  6:34 PM  Result Value Ref Range Status   Specimen Description BLOOD RAC  Final   Special Requests   Final    BOTTLES DRAWN AEROBIC AND ANAEROBIC Blood Culture adequate volume   Culture   Final    NO GROWTH 5 DAYS Performed at [facility], [address], [place], [place] [address]    Report Status 09/06/2017 FINAL  Final    Coagulation Studies: No results for input(s): LABPROT, INR in the last 72 hours.  Urinalysis: No results for input(s): COLORURINE, LABSPEC, PHURINE, GLUCOSEU, HGBUR,  BILIRUBINUR, KETONESUR, PROTEINUR, UROBILINOGEN, NITRITE, LEUKOCYTESUR in the last 72 hours.  Invalid input(s): APPERANCEUR    Imaging: No results found.   Medications:   . sodium chloride    . sodium chloride    . sodium chloride    . sodium chloride     . brimonidine  1 drop Both Eyes BID  . carbamazepine  200 mg Oral TID  . cephALEXin  250 mg Oral Q8H  . dicyclomine  20 mg Oral TID AC  . famotidine  20 mg Oral Daily  . haloperidol  5 mg Oral BID  . LORazepam  2 mg Oral QHS  . metoprolol tartrate  25 mg Oral BID  . midodrine  10 mg Oral Daily  . OLANZapine  15 mg Oral QHS  . rOPINIRole  2 mg Oral Daily  . timolol  1 drop Both Eyes BID   sodium chloride, sodium chloride, sodium chloride, sodium chloride, acetaminophen, alteplase, alteplase, alum & mag hydroxide-simeth, heparin, heparin, lidocaine (PF), lidocaine (PF), lidocaine-prilocaine, magnesium hydroxide, pentafluoroprop-tetrafluoroeth  Assessment/ Plan:  72 y.o. female with end stage renal disease on hemodialysis, diverticulosis, hypotension, bipolar disorder, polycystic kidney disease   [facility]/MWF  1.  ESRD on HD.  She due for hemodialysis today.  Orders have been prepared.  2.  Anemia chronic kidney disease.  Hemoglobin up to 13.  Hold off on Epogen.  3.  SHPTH.  Phosphorus currently 5.2 and at target.  4.  Hypotension.  Continue midodrine 10 mg daily.  5.  Depression.  Management as per psychiatry.   LOS: 3 Ralphael [place] 5/31/20191:26 PM

## 2017-12-18 NOTE — Progress Notes (Signed)
[facility] MD Progress Note  12/18/2017 10:05 AM Joyce [name]  MRN:  [id number]  Subjective:  Joyce [name] has no complaints today. She slept better and did not have any problems with restless legs. She denies any depression or anxiety. She denies hallucinations but does seem distracted again. Her family reports that the patient has been hallucinating lately, very easily agitated to the point of destroying property, including a flat TV, and argumentative. They already removed her belongings (bed) from their house upon patient's request. Nobody, including D[place], believes that she can make it on her own. The family "does not believe" in placement but they are rather pessimistic about her ability to control her behavior.  Principal Problem: Bipolar I disorder, most recent episode (or current) manic ([place]) Diagnosis:   Patient Active Problem List   Diagnosis Date Noted  . Bipolar affective disorder, current episode depressed with psychotic symptoms ([place]) [F31.2] 09/17/2017    Priority: High  . Bipolar I disorder, most recent episode (or current) manic ([place]) [F31.10] 09/02/2017    Priority: High  . UTI (urinary tract infection) [N39.0] 12/16/2017  . Bipolar 1 disorder, depressed ([place]) [F31.9] 12/15/2017  . Palliative care encounter [Z51.5]   . Acute delirium [R41.0] 09/02/2017  . Altered mental status [R41.82]   . Acute encephalopathy [G93.40] 09/01/2017  . ESRD on dialysis ([place]) [N18.6, Z99.2] 04/06/2017  . Complication of vascular access for dialysis [T82.9XXA] 04/06/2017  . Hematuria [R31.9] 12/18/2016  . Polycystic kidney [Q61.3] 10/12/2016  . Ruptured cyst of kidney [Q61.00] 10/03/2016  . HTN (hypertension) [I10] 10/01/2016  . Chronic kidney disease [N18.9] 10/01/2016  . Anemia associated with chronic renal failure [N18.9, D63.1] 09/24/2016  . Acute kidney insufficiency [N28.9] 07/28/2016  . Noninfectious diarrhea [K52.9]   . Benign neoplasm of cecum [D12.0]   . Benign neoplasm of ascending  colon [D12.2]   . Diarrhea [R19.7]   . Nausea [R11.0]   . Gastric polyp [K31.7]   . Chronic constipation [K59.09] 12/27/2014  . Nausea with vomiting [R11.2] 12/27/2014  . Left sided abdominal pain [R10.9] 12/27/2014  . Personal history of colonic polyps [Z86.010] 05/10/2013   Total Time spent with patient: 20 minutes  Past Psychiatric History: bipolar disorder  Past Medical History:  Past Medical History:  Diagnosis Date  . Anal fissure   . Bipolar affective disorder ([place])   . CKD (chronic kidney disease)    Dr Holley Raring [name] 4  . Colon polyps   . Diverticulitis   . Diverticulitis   . Family history of adverse reaction to anesthesia    mom - PONV  . GERD (gastroesophageal reflux disease)   . Headache    migraines - none over 10 yrs  . Heart murmur   . History of hiatal hernia   . Hypertension   . Pancreatitis    Valproic acid  . Vertigo     Past Surgical History:  Procedure Laterality Date  . A/V FISTULAGRAM Left 04/14/2017   Procedure: A/V Fistulagram;  Surgeon: Katha Cabal, MD;  Location: [facility];  Service: Cardiovascular;  Laterality: Left;  . A/V FISTULAGRAM Left 06/09/2017   Procedure: A/V FISTULAGRAM;  Surgeon: Katha Cabal, MD;  Location: [facility];  Service: Cardiovascular;  Laterality: Left;  . A/V FISTULAGRAM Left 11/02/2017   Procedure: A/V FISTULAGRAM;  Surgeon: Algernon Huxley, MD;  Location: [facility];  Service: Cardiovascular;  Laterality: Left;  . A/V SHUNT INTERVENTION N/A 11/02/2017   Procedure: A/V SHUNT INTERVENTION;  Surgeon: Algernon Huxley, MD;  Location: [facility];  Service: Cardiovascular;  Laterality: N/A;  . ABDOMINAL HYSTERECTOMY  1990 ?  . AV FISTULA PLACEMENT  4/30  . BREAST EXCISIONAL BIOPSY Left 1994   neg surgical bx  . CHOLECYSTECTOMY  2003  . COLONOSCOPY  2014   Dr. Jamal Collin  . COLONOSCOPY WITH PROPOFOL N/A 09/24/2015   Procedure: COLONOSCOPY WITH random colon byopies.;  Surgeon:  Lucilla Lame, MD;  Location: [facility];  Service: Endoscopy;  Laterality: N/A;  . ESOPHAGOGASTRODUODENOSCOPY (EGD) WITH PROPOFOL N/A 09/24/2015   Procedure: ESOPHAGOGASTRODUODENOSCOPY (EGD) ;  Surgeon: Lucilla Lame, MD;  Location: [place];  Service: Endoscopy;  Laterality: N/A;  . EYE SURGERY    . PERIPHERAL VASCULAR CATHETERIZATION N/A 05/29/2015   Procedure: A/V Shuntogram/Fistulagram;  Surgeon: Katha Cabal, MD;  Location: [facility];  Service: Cardiovascular;  Laterality: N/A;  . PERIPHERAL VASCULAR CATHETERIZATION N/A 05/29/2015   Procedure: A/V Shunt Intervention;  Surgeon: Katha Cabal, MD;  Location: [facility];  Service: Cardiovascular;  Laterality: N/A;  . POLYPECTOMY  09/24/2015   Procedure: POLYPECTOMY INTESTINAL;  Surgeon: Lucilla Lame, MD;  Location: [facility];  Service: Endoscopy;;  cecal polyp ascending polyp   Family History:  Family History  Problem Relation Age of Onset  . Stroke Father   . Hypertension Father   . Breast cancer Other   . Colon cancer Neg Hx   . Liver disease Neg Hx    Family Psychiatric  History: none Social History:  Social History   Substance and Sexual Activity  Alcohol Use No  . Alcohol/week: 0.0 oz     Social History   Substance and Sexual Activity  Drug Use No    Social History   Socioeconomic History  . Marital status: Single    Spouse name: Not on file  . Number of children: 1  . Years of education: Not on file  . Highest education level: Not on file  Occupational History  . Occupation: Surveyor, quantity: [employer]  Social Needs  . Financial resource strain: Not on file  . Food insecurity:    Worry: Not on file    Inability: Not on file  . Transportation needs:    Medical: Not on file    Non-medical: Not on file  Tobacco Use  . Smoking status: Never Smoker  . Smokeless tobacco: Never Used  Substance and Sexual Activity  . Alcohol use: No    Alcohol/week: 0.0 oz   . Drug use: No  . Sexual activity: Not Currently  Lifestyle  . Physical activity:    Days per week: Not on file    Minutes per session: Not on file  . Stress: Not on file  Relationships  . Social connections:    Talks on phone: Not on file    Gets together: Not on file    Attends religious service: Not on file    Active member of club or organization: Not on file    Attends meetings of clubs or organizations: Not on file    Relationship status: Not on file  Other Topics Concern  . 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   Additional Social History:  Sleep: Fair  Appetite:  Fair  Current Medications: Current Facility-Administered Medications  Medication Dose Route Frequency Provider Last Rate Last Dose  . 0.9 %  sodium chloride infusion  100 mL Intravenous PRN Lateef, Munsoor, MD      . 0.9 %  sodium chloride infusion  100 mL Intravenous PRN Lateef, Munsoor, MD      . 0.9 %  sodium chloride infusion  100 mL Intravenous PRN Lateef, Munsoor, MD      . 0.9 %  sodium chloride infusion  100 mL Intravenous PRN Lateef, Munsoor, MD      . acetaminophen (TYLENOL) tablet 325 mg  325 mg Oral Q6H PRN [name], [name], MD   325 mg at 12/17/17 0021  . alteplase (CATHFLO ACTIVASE) injection 2 mg  2 mg Intracatheter Once PRN Lateef, Munsoor, MD      . alteplase (CATHFLO ACTIVASE) injection 2 mg  2 mg Intracatheter Once PRN Lateef, Munsoor, MD      . alum & mag hydroxide-simeth (MAALOX/MYLANTA) 818-299-37 MG/5ML suspension 30 mL  30 mL Oral Q4H PRN Clapacs, [name], MD      . brimonidine (ALPHAGAN) 0.2 % ophthalmic solution 1 drop  1 drop Both Eyes BID [name], [name], MD   1 drop at 12/18/17 0855  . carbamazepine (TEGRETOL) tablet 200 mg  200 mg Oral TID [name], [name], MD   200 mg at 12/17/17 1800  . cephALEXin (KEFLEX) capsule 250 mg  250 mg Oral Q8H  [name], [name], MD   250 mg at 12/18/17 0534  . dicyclomine (BENTYL) tablet 20 mg  20 mg Oral TID AC Clapacs, [name], MD   20 mg at 12/17/17 1800  . famotidine (PEPCID) tablet 20 mg  20 mg Oral Daily Clapacs, [name], MD   20 mg at 12/17/17 1002  . haloperidol (HALDOL) tablet 2 mg  2 mg Oral BID [name], [name], MD   2 mg at 12/17/17 1800  . heparin injection 1,000 Units  1,000 Units Dialysis PRN Lateef, Munsoor, MD      . heparin injection 1,000 Units  1,000 Units Dialysis PRN Lateef, Munsoor, MD      . lidocaine (PF) (XYLOCAINE) 1 % injection 5 mL  5 mL Intradermal PRN Lateef, Munsoor, MD      . lidocaine (PF) (XYLOCAINE) 1 % injection 5 mL  5 mL Intradermal PRN Lateef, Munsoor, MD      . lidocaine-prilocaine (EMLA) cream 1 application  1 application Topical PRN Lateef, Munsoor, MD      . LORazepam (ATIVAN) tablet 2 mg  2 mg Oral QHS [name], [name], MD   2 mg at 12/17/17 2206  . magnesium hydroxide (MILK OF MAGNESIA) suspension 30 mL  30 mL Oral Daily PRN Clapacs, [name], MD      . metoprolol tartrate (LOPRESSOR) tablet 25 mg  25 mg Oral BID Clapacs, [name], MD   25 mg at 12/16/17 0831  . midodrine (PROAMATINE) tablet 10 mg  10 mg Oral Daily Clapacs, [name], MD   10 mg at 12/18/17 0848  . OLANZapine (ZYPREXA) tablet 15 mg  15 mg Oral QHS Clapacs, [name], MD   15 mg at 12/17/17 2206  . pentafluoroprop-tetrafluoroeth (GEBAUERS) aerosol 1 application  1 application Topical PRN Lateef, Munsoor, MD      . rOPINIRole (REQUIP) tablet 2 mg  2 mg Oral Daily [name], [name], MD   2 mg at 12/17/17 1800  . timolol (TIMOPTIC)  0.5 % ophthalmic solution 1 drop  1 drop Both Eyes BID Clapacs, [name], MD   1 drop at 12/18/17 0848    Lab Results:  Results for orders placed or performed during the hospital encounter of 12/15/17 (from the past 48 hour(s))  Renal function panel     Status: Abnormal   Collection Time: 12/16/17  1:41 PM  Result Value Ref Range   Sodium 138 135 - 145 mmol/L    Potassium 3.2 (L) 3.5 - 5.1 mmol/L   Chloride 98 (L) 101 - 111 mmol/L   CO2 27 22 - 32 mmol/L   Glucose, Bld 129 (H) 65 - 99 mg/dL   BUN 21 (H) 6 - 20 mg/dL   Creatinine, Ser 4.92 (H) 0.44 - 1.00 mg/dL   Calcium 9.0 8.9 - 10.3 mg/dL   Phosphorus 4.3 2.5 - 4.6 mg/dL   Albumin 3.9 3.5 - 5.0 g/dL   GFR calc non Af Amer 8 (L) >60 mL/min   GFR calc Af Amer 9 (L) >60 mL/min    Comment: (NOTE) The eGFR has been calculated using the CKD EPI equation. This calculation has not been validated in all clinical situations. eGFR's persistently <60 mL/min signify possible Chronic Kidney Disease.    Anion gap 13 5 - 15    Comment: Performed at [facility], [address] [place], [place] [address]  CBC     Status: Abnormal   Collection Time: 12/16/17  1:41 PM  Result Value Ref Range   WBC 7.3 3.6 - 11.0 K/uL   RBC 3.24 (L) 3.80 - 5.20 MIL/uL   Hemoglobin 11.2 (L) 12.0 - 16.0 g/dL   HCT 33.4 (L) 35.0 - 47.0 %   MCV 103.0 (H) 80.0 - 100.0 fL   MCH 34.5 (H) 26.0 - 34.0 pg   MCHC 33.5 32.0 - 36.0 g/dL   RDW 15.8 (H) 11.5 - 14.5 %   Platelets 208 150 - 440 K/uL    Comment: Performed at [facility], [address] [place], [place] [address]  Hepatitis B surface antigen     Status: None   Collection Time: 12/16/17  1:43 PM  Result Value Ref Range   Hepatitis B Surface Ag Negative Negative    Comment: (NOTE) Performed At: [facility] [place], [place] [id number] Rush Farmer MD [phone] Performed at [facility], [address] [place], [place] [address]   Renal function panel     Status: Abnormal   Collection Time: 12/16/17  5:19 PM  Result Value Ref Range   Sodium 140 135 - 145 mmol/L   Potassium 3.1 (L) 3.5 - 5.1 mmol/L   Chloride 96 (L) 101 - 111 mmol/L   CO2 32 22 - 32 mmol/L   Glucose, Bld 116 (H) 65 - 99 mg/dL   BUN 7 6 - 20 mg/dL   Creatinine, Ser 2.27 (H) 0.44 - 1.00 mg/dL   Calcium 9.2 8.9 - 10.3 mg/dL    Phosphorus 2.3 (L) 2.5 - 4.6 mg/dL   Albumin 4.4 3.5 - 5.0 g/dL   GFR calc non Af Amer 21 (L) >60 mL/min   GFR calc Af Amer 24 (L) >60 mL/min    Comment: (NOTE) The eGFR has been calculated using the CKD EPI equation. This calculation has not been validated in all clinical situations. eGFR's persistently <60 mL/min signify possible Chronic Kidney Disease.    Anion gap 12 5 - 15    Comment: Performed at [facility], [address] [place], [place] [address]  CBC     Status: Abnormal   Collection Time: 12/16/17  5:19 PM  Result Value Ref Range   WBC 6.6 3.6 - 11.0 K/uL   RBC 3.68 (L) 3.80 - 5.20 MIL/uL   Hemoglobin 13.0 12.0 - 16.0 g/dL   HCT 38.0 35.0 - 47.0 %   MCV 103.0 (H) 80.0 - 100.0 fL   MCH 35.4 (H) 26.0 - 34.0 pg   MCHC 34.4 32.0 - 36.0 g/dL   RDW 16.1 (H) 11.5 - 14.5 %   Platelets 234 150 - 440 K/uL    Comment: Performed at [facility], [address] [place], [place] [address]  Glucose, capillary     Status: Abnormal   Collection Time: 12/16/17 11:29 PM  Result Value Ref Range   Glucose-Capillary 115 (H) 65 - 99 mg/dL    Blood Alcohol level:  Lab Results  Component Value Date   ETH <10 12/14/2017   ETH <10 46/50/3546    Metabolic Disorder Labs: Lab Results  Component Value Date   HGBA1C 4.8 09/18/2017   MPG 91.06 09/18/2017   No results found for: PROLACTIN Lab Results  Component Value Date   CHOL 251 (H) 09/18/2017   TRIG 124 09/18/2017   HDL 66 09/18/2017   CHOLHDL 3.8 09/18/2017   VLDL 25 09/18/2017   LDLCALC 160 (H) 09/18/2017   LDLCALC 43 05/14/2014    Physical Findings: AIMS: Facial and Oral Movements Muscles of Facial Expression: None, normal Lips and Perioral Area: None, normal Jaw: None, normal Tongue: None, normal,Extremity Movements Upper (arms, wrists, hands, fingers): None, normal Lower (legs, knees, ankles, toes): None, normal, Trunk Movements Neck, shoulders, hips: None, normal, Overall Severity Severity  of abnormal movements (highest score from questions above): None, normal Incapacitation due to abnormal movements: None, normal Patient's awareness of abnormal movements (rate only patient's report): No Awareness, Dental Status Current problems with teeth and/or dentures?: No Does patient usually wear dentures?: No  CIWA:  CIWA-Ar Total: 2 COWS:  COWS Total Score: 2  Musculoskeletal: Strength & Muscle Tone: within normal limits Gait & Station: normal Patient leans: N/A  Psychiatric Specialty Exam: Physical Exam  Nursing note and vitals reviewed. Psychiatric: Her speech is normal and behavior is normal. Her affect is blunt. Thought content is paranoid and delusional. Cognition and memory are normal. She expresses impulsivity.    Review of Systems  Neurological: Negative.   Psychiatric/Behavioral: Positive for hallucinations. The patient has insomnia.   All other systems reviewed and are negative.   Blood pressure (!) 112/52, pulse 70, temperature 98.8 F (37.1 C), temperature source Oral, resp. rate 18, height '5\' 3"'$  (1.6 m), weight 72 kg (158 lb 11.7 oz), SpO2 100 %.Body mass index is 28.12 kg/m.  General Appearance: Casual  Eye Contact:  Good  Speech:  Clear and Coherent  Volume:  Normal  Mood:  Irritable  Affect:  Congruent  Thought Process:  Goal Directed and Descriptions of Associations: Intact  Orientation:  Full (Time, Place, and Person)  Thought Content:  Delusions, Hallucinations: Auditory and Paranoid Ideation  Suicidal Thoughts:  No  Homicidal Thoughts:  No  Memory:  Immediate;   Poor Recent;   Poor Remote;   Poor  Judgement:  Poor  Insight:  Lacking  Psychomotor Activity:  Normal  Concentration:  Concentration: Poor and Attention Span: Poor  Recall:  Poor  Fund of Knowledge:  Poor  Language:  Fair  Akathisia:  No  Handed:  Right  AIMS (if indicated):     Assets:  Communication Skills Desire for Improvement Financial  Resources/Insurance Housing Resilience Social Support  ADL's:  Intact  Cognition:  WNL  Sleep:  Number of Hours: 7     Treatment Plan Summary: Daily contact with patient to assess and evaluate symptoms and progress in treatment and Medication management   Ms. Matson is a 72 year old female with a history of bipolar illness and ESRD admitted for agitation. The family reports frequent agitated behavior and believes that her bipolar is not under control.   #Mood/psychosis -increase Tegretol to 200 mg TID, level 3.7  -continue Zyprexa 15 mg nightly -increase Haldol to 5 mg BID  #Social -there is a new HCPOA scanned on chart -her son is designated -patient expressly refuses ECT  #Insomnia -Ativan 2 mg nightly  #UTI -continue Keflex 250 mg TID  #ESRD, on dialysis MWF -nephrology help is greatly appreciated -continue Midodrin 10 mg daily  #HTN -Metoprolol 25 mg BID  #Restless legs -continue Requip 2 mg at 15:00 daily  #GERD -continue Pepcid 20 mg daily and Bentyl 20 mg TID  #Metabolic syndrome monitoring -labs were obtained recently  #Disposition -discharge to home with family -follow up with Dr. Thurmond Butts on June 26 ay 4:30     Orson Slick, MD 12/18/2017, 10:05 AM

## 2017-12-18 NOTE — Progress Notes (Signed)
Recreation Therapy Notes  Date: 12/18/2017  Time: 9:30 am  Location: Craft Room  Behavioral response: Not Engaged  Intervention Topic:  Leisure  Discussion/Intervention:  Group content today was focused on leisure. The group defined what leisure is and some positive leisure activities they participate in. Individuals identified the difference between good and bad leisure. Participants expressed how they feel after participating in the leisure of their choice. The group discussed how they go about picking a leisure activity and if others are involved in their leisure activities. The patient stated how many leisure activities they too choose from and reasons why it is important to have leisure time. Individuals participated in the intervention "Leisure Jeopardy" where they had a chance to identify new leisure activities as well as benefits of leisure. Clinical Observations/Feedback:  Patient came to group late due to unknown reasons.She was not engaged with peers,staff or activity during group.  Malyah Ohlrich LRT/CTRS         Keishaun Hazel 12/18/2017 12:18 PM

## 2017-12-18 NOTE — Progress Notes (Signed)
Post dialysis assessment 

## 2017-12-18 NOTE — Progress Notes (Signed)
Chaplain was called to validate a HCPOA provided by the patient to [facility]. The patient's nurse was concerned that the HCPOA was not notarized.  Upon review, Chaplain discovered that a faint notary stamp could be seen.  Chaplain explained that though the document appeared to be witnessed and notarized, it was impossible to read the notary's information. Without contact information for the notary, it is not possible to verify. Chaplain asked the nurse to request the original or a legible copy for the patient's record.

## 2017-12-18 NOTE — Progress Notes (Signed)
Pre dialysis assessment 

## 2017-12-18 NOTE — Plan of Care (Signed)
Patient slept for Estimated Hours of 7; Precautionary checks every 15 minutes for safety maintained, room free of safety hazards, patient sustains no injury or falls during this shift.  Problem: Coping: Goal: Ability to identify and develop effective coping behavior will improve Outcome: Progressing Goal: Ability to interact with others will improve Outcome: Progressing   Problem: Health Behavior/Discharge Planning: Goal: Ability to make decisions will improve Outcome: Progressing   Problem: Nutrition: Goal: Adequate nutrition will be maintained Outcome: Progressing   Problem: Safety: Goal: Ability to remain free from injury will improve Outcome: Progressing

## 2017-12-18 NOTE — Plan of Care (Signed)
Patient has the ability to identify effective coping behavior as well as the ability to interact with other members on the unit without any issues at this time. Patient can make decisions for herself and has used fair eye contact when communicating with this Probation officer. Patient has verbalized understanding and has been in compliance with her prescribed medication regimen. Patient has the ability to manage her health related needs and maintain adequate nutrition by consuming majority of her meals daily. Patient verbalizes understanding of the general information that has been provided to her and all questions/concerns have been addressed and answered at this time. Patient has not experienced any health-related complications thus far on the unit. Patient's overall level of comfort has improved and she has remained free from injury on the unit. Patient's risk for impaired skin integrity has decreased and patient remains safe at this time.  Problem: Coping: Goal: Ability to identify and develop effective coping behavior will improve Outcome: Progressing Goal: Ability to interact with others will improve Outcome: Progressing Goal: Demonstration of participation in decision-making regarding own care will improve Outcome: Progressing Goal: Ability to use eye contact when communicating with others will improve Outcome: Progressing   Problem: Education: Goal: Utilization of techniques to improve thought processes will improve Outcome: Progressing Goal: Knowledge of the prescribed therapeutic regimen will improve Outcome: Progressing   Problem: Health Behavior/Discharge Planning: Goal: Ability to make decisions will improve Outcome: Progressing Goal: Compliance with therapeutic regimen will improve Outcome: Progressing   Problem: Education: Goal: Knowledge of General Education information will improve Outcome: Progressing   Problem: Health Behavior/Discharge Planning: Goal: Ability to manage  health-related needs will improve Outcome: Progressing   Problem: Activity: Goal: Risk for activity intolerance will decrease Outcome: Progressing   Problem: Nutrition: Goal: Adequate nutrition will be maintained Outcome: Progressing   Problem: Coping: Goal: Level of anxiety will decrease Outcome: Progressing   Problem: Elimination: Goal: Will not experience complications related to bowel motility Outcome: Progressing Goal: Will not experience complications related to urinary retention Outcome: Progressing   Problem: Pain Managment: Goal: General experience of comfort will improve Outcome: Progressing   Problem: Safety: Goal: Ability to remain free from injury will improve Outcome: Progressing   Problem: Skin Integrity: Goal: Risk for impaired skin integrity will decrease Outcome: Progressing

## 2017-12-19 MED ORDER — HALOPERIDOL 1 MG PO TABS
2.0000 mg | ORAL_TABLET | ORAL | Status: DC
  Administered 2017-12-19 – 2017-12-23 (×5): 2 mg via ORAL
  Filled 2017-12-19 (×5): qty 2

## 2017-12-19 MED ORDER — HALOPERIDOL 5 MG PO TABS
5.0000 mg | ORAL_TABLET | Freq: Every day | ORAL | Status: DC
  Administered 2017-12-19 – 2017-12-22 (×4): 5 mg via ORAL
  Filled 2017-12-19 (×4): qty 1

## 2017-12-19 NOTE — Plan of Care (Signed)
Patient is interacting appropriately.

## 2017-12-19 NOTE — Plan of Care (Signed)
D: Patient denies SI/HI/AVH. Patient verbally contracts for safety. Patient is calm, cooperative and pleasant. Patient presents with sad affect and preoccupied during assessment. Patient is in milieu for meals, patient has no interaction with peers. Patient has no complaints at this time. Patient is mildly guarded during assessment. Patient did not fill out a Patient Self Inventory sheet, when directed patient stated "I will do it later."   A: Patient was assessed by this nurse. Patient was oriented to unit. Patient's safety was maintained on unit. Q x 15 minute observation checks were completed for safety. Patient care plan was reviewed. Patient was offered support and encouragement. Patient was encourage to attend groups, participate in unit activities and continue with plan of care.    R: Patient has no complaints of pain at this time. Patient is receptive to treatment and safety maintained on unit.     Problem: Nutrition: Goal: Adequate nutrition will be maintained Outcome: Progressing   Problem: Safety: Goal: Ability to remain free from injury will improve Outcome: Progressing   Problem: Safety: Goal: Ability to disclose and discuss suicidal ideas will improve Outcome: Progressing

## 2017-12-19 NOTE — Progress Notes (Signed)
[facility] MD Progress Note  12/19/2017 12:51 PM Joyce [name]  MRN:  [id number]  Subjective:    Joyce [name] has no complaints. She is cool and collected, denies any symptoms of depression, anxiety or psychosis. She accepts medications and tolerates them well. Tolerates increased Haldol with no problems. Took dialysis yesterday. Discussed HCPOA. She confirms that she wishes no ECT in the future. Her son is in charge. We do have a poor quality copy of HCPOA on the chart.   Spoke with the daughter to bring a better copy. Family supportive but tired of [place] mood swings and agitation.   Principal Problem: Bipolar I disorder, most recent episode (or current) manic ([place]) Diagnosis:   Patient Active Problem List   Diagnosis Date Noted  . Bipolar affective disorder, current episode depressed with psychotic symptoms ([place]) [F31.2] 09/17/2017    Priority: High  . Bipolar I disorder, most recent episode (or current) manic ([place]) [F31.10] 09/02/2017    Priority: High  . UTI (urinary tract infection) [N39.0] 12/16/2017  . Bipolar 1 disorder, depressed ([place]) [F31.9] 12/15/2017  . Palliative care encounter [Z51.5]   . Acute delirium [R41.0] 09/02/2017  . Altered mental status [R41.82]   . Acute encephalopathy [G93.40] 09/01/2017  . ESRD on dialysis ([place]) [N18.6, Z99.2] 04/06/2017  . Complication of vascular access for dialysis [T82.9XXA] 04/06/2017  . Hematuria [R31.9] 12/18/2016  . Polycystic kidney [Q61.3] 10/12/2016  . Ruptured cyst of kidney [Q61.00] 10/03/2016  . HTN (hypertension) [I10] 10/01/2016  . Chronic kidney disease [N18.9] 10/01/2016  . Anemia associated with chronic renal failure [N18.9, D63.1] 09/24/2016  . Acute kidney insufficiency [N28.9] 07/28/2016  . Noninfectious diarrhea [K52.9]   . Benign neoplasm of cecum [D12.0]   . Benign neoplasm of ascending colon [D12.2]   . Diarrhea [R19.7]   . Nausea [R11.0]   . Gastric polyp [K31.7]   . Chronic constipation [K59.09] 12/27/2014  .  Nausea with vomiting [R11.2] 12/27/2014  . Left sided abdominal pain [R10.9] 12/27/2014  . Personal history of colonic polyps [Z86.010] 05/10/2013   Total Time spent with patient: 20 minutes  Past Psychiatric History: bipolar disorder  Past Medical History:  Past Medical History:  Diagnosis Date  . Anal fissure   . Bipolar affective disorder ([place])   . CKD (chronic kidney disease)    Dr Holley Raring [name] 4  . Colon polyps   . Diverticulitis   . Diverticulitis   . Family history of adverse reaction to anesthesia    mom - PONV  . GERD (gastroesophageal reflux disease)   . Headache    migraines - none over 10 yrs  . Heart murmur   . History of hiatal hernia   . Hypertension   . Pancreatitis    Valproic acid  . Vertigo     Past Surgical History:  Procedure Laterality Date  . A/V FISTULAGRAM Left 04/14/2017   Procedure: A/V Fistulagram;  Surgeon: Katha Cabal, MD;  Location: [facility];  Service: Cardiovascular;  Laterality: Left;  . A/V FISTULAGRAM Left 06/09/2017   Procedure: A/V FISTULAGRAM;  Surgeon: Katha Cabal, MD;  Location: [place] [facility];  Service: Cardiovascular;  Laterality: Left;  . A/V FISTULAGRAM Left 11/02/2017   Procedure: A/V FISTULAGRAM;  Surgeon: Algernon Huxley, MD;  Location: [place] [facility];  Service: Cardiovascular;  Laterality: Left;  . A/V SHUNT INTERVENTION N/A 11/02/2017   Procedure: A/V SHUNT INTERVENTION;  Surgeon: Algernon Huxley, MD;  Location: [place] [facility];  Service:  Cardiovascular;  Laterality: N/A;  . ABDOMINAL HYSTERECTOMY  1990 ?  . AV FISTULA PLACEMENT  4/30  . BREAST EXCISIONAL BIOPSY Left 1994   neg surgical bx  . CHOLECYSTECTOMY  2003  . COLONOSCOPY  2014   Dr. Jamal Collin  . COLONOSCOPY WITH PROPOFOL N/A 09/24/2015   Procedure: COLONOSCOPY WITH random colon byopies.;  Surgeon: Lucilla Lame, MD;  Location: [facility];  Service: Endoscopy;  Laterality: N/A;  . ESOPHAGOGASTRODUODENOSCOPY (EGD) WITH PROPOFOL  N/A 09/24/2015   Procedure: ESOPHAGOGASTRODUODENOSCOPY (EGD) ;  Surgeon: Lucilla Lame, MD;  Location: [place];  Service: Endoscopy;  Laterality: N/A;  . EYE SURGERY    . PERIPHERAL VASCULAR CATHETERIZATION N/A 05/29/2015   Procedure: A/V Shuntogram/Fistulagram;  Surgeon: Katha Cabal, MD;  Location: [place] [facility];  Service: Cardiovascular;  Laterality: N/A;  . PERIPHERAL VASCULAR CATHETERIZATION N/A 05/29/2015   Procedure: A/V Shunt Intervention;  Surgeon: Katha Cabal, MD;  Location: [place] [facility];  Service: Cardiovascular;  Laterality: N/A;  . POLYPECTOMY  09/24/2015   Procedure: POLYPECTOMY INTESTINAL;  Surgeon: Lucilla Lame, MD;  Location: [place];  Service: Endoscopy;;  cecal polyp ascending polyp   Family History:  Family History  Problem Relation Age of Onset  . Stroke Father   . Hypertension Father   . Breast cancer Other   . Colon cancer Neg Hx   . Liver disease Neg Hx    Family Psychiatric  History: none Social History:  Social History   Substance and Sexual Activity  Alcohol Use No  . Alcohol/week: 0.0 oz     Social History   Substance and Sexual Activity  Drug Use No    Social History   Socioeconomic History  . Marital status: Single    Spouse name: Not on file  . Number of children: 1  . Years of education: Not on file  . Highest education level: Not on file  Occupational History  . Occupation: Surveyor, quantity: [employer]  Social Needs  . Financial resource strain: Not on file  . Food insecurity:    Worry: Not on file    Inability: Not on file  . Transportation needs:    Medical: Not on file    Non-medical: Not on file  Tobacco Use  . Smoking status: Never Smoker  . Smokeless tobacco: Never Used  Substance and Sexual Activity  . Alcohol use: No    Alcohol/week: 0.0 oz  . Drug use: No  . Sexual activity: Not Currently  Lifestyle  . Physical activity:    Days per week: Not on file    Minutes per  session: Not on file  . Stress: Not on file  Relationships  . Social connections:    Talks on phone: Not on file    Gets together: Not on file    Attends religious service: Not on file    Active member of club or organization: Not on file    Attends meetings of clubs or organizations: Not on file    Relationship status: Not on file  Other Topics Concern  . 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   Additional Social History:                         Sleep: Fair  Appetite:  Fair  Current Medications: Current Facility-Administered Medications  Medication Dose Route Frequency Provider Last Rate Last Dose  . 0.9 %  sodium chloride infusion  100 mL Intravenous PRN Lateef, Munsoor, MD      . 0.9 %  sodium chloride infusion  100 mL Intravenous PRN Lateef, Munsoor, MD      . 0.9 %  sodium chloride infusion  100 mL Intravenous PRN Lateef, Munsoor, MD      . 0.9 %  sodium chloride infusion  100 mL Intravenous PRN Lateef, Munsoor, MD      . acetaminophen (TYLENOL) tablet 325 mg  325 mg Oral Q6H PRN Shay Jhaveri B, MD   325 mg at 12/19/17 0129  . alteplase (CATHFLO ACTIVASE) injection 2 mg  2 mg Intracatheter Once PRN Lateef, Munsoor, MD      . alteplase (CATHFLO ACTIVASE) injection 2 mg  2 mg Intracatheter Once PRN Lateef, Munsoor, MD      . alum & mag hydroxide-simeth (MAALOX/MYLANTA) [phone] MG/5ML suspension 30 mL  30 mL Oral Q4H PRN Clapacs, [name], MD      . brimonidine (ALPHAGAN) 0.2 % ophthalmic solution 1 drop  1 drop Both Eyes BID Leasa Kincannon B, MD   1 drop at 12/19/17 1022  . carbamazepine (TEGRETOL) tablet 200 mg  200 mg Oral TID Sheran Newstrom B, MD   200 mg at 12/19/17 1225  . cephALEXin (KEFLEX) capsule 250 mg  250 mg Oral Q8H Kadience Macchi B, MD   250 mg at 12/19/17 0641  . dicyclomine (BENTYL) tablet 20 mg  20 mg Oral TID AC Clapacs, [name], MD   20 mg  at 12/19/17 1225  . famotidine (PEPCID) tablet 20 mg  20 mg Oral Daily Clapacs, [name], MD   20 mg at 12/19/17 0820  . haloperidol (HALDOL) tablet 2 mg  2 mg Oral BH-q7a Darnise Montag B, MD   2 mg at 12/19/17 0820  . haloperidol (HALDOL) tablet 5 mg  5 mg Oral QHS Zoey Gilkeson B, MD      . heparin injection 1,000 Units  1,000 Units Dialysis PRN Lateef, Munsoor, MD      . heparin injection 1,000 Units  1,000 Units Dialysis PRN Lateef, Munsoor, MD      . lidocaine (PF) (XYLOCAINE) 1 % injection 5 mL  5 mL Intradermal PRN Lateef, Munsoor, MD      . lidocaine (PF) (XYLOCAINE) 1 % injection 5 mL  5 mL Intradermal PRN Lateef, Munsoor, MD      . lidocaine-prilocaine (EMLA) cream 1 application  1 application Topical PRN Lateef, Munsoor, MD      . LORazepam (ATIVAN) tablet 2 mg  2 mg Oral QHS Latora Quarry B, MD   2 mg at 12/18/17 2152  . magnesium hydroxide (MILK OF MAGNESIA) suspension 30 mL  30 mL Oral Daily PRN Clapacs, [name], MD      . metoprolol tartrate (LOPRESSOR) tablet 25 mg  25 mg Oral BID Clapacs, [name], MD   25 mg at 12/16/17 0831  . midodrine (PROAMATINE) tablet 10 mg  10 mg Oral Daily Clapacs, [name], MD   10 mg at 12/19/17 0820  . OLANZapine (ZYPREXA) tablet 15 mg  15 mg Oral QHS Clapacs, [name], MD   15 mg at 12/18/17 2152  . pentafluoroprop-tetrafluoroeth (GEBAUERS) aerosol 1 application  1 application Topical PRN Lateef, Munsoor, MD      . rOPINIRole (REQUIP) tablet 2 mg  2 mg Oral Daily Areej Tayler B, MD   2 mg  at 12/18/17 1657  . timolol (TIMOPTIC) 0.5 % ophthalmic solution 1 drop  1 drop Both Eyes BID Clapacs, [name], MD   1 drop at 12/19/17 5852    Lab Results:  Results for orders placed or performed during the hospital encounter of 12/15/17 (from the past 48 hour(s))  Phosphorus     Status: Abnormal   Collection Time: 12/18/17 12:35 PM  Result Value Ref Range   Phosphorus 5.2 (H) 2.5 - 4.6 mg/dL    Comment: Performed at [facility], [address] [place], [place] [address]    Blood Alcohol level:  Lab Results  Component Value Date   [facility] <10 12/14/2017   ETH <10 23/53/6144    Metabolic Disorder Labs: Lab Results  Component Value Date   HGBA1C 4.8 09/18/2017   MPG 91.06 09/18/2017   No results found for: PROLACTIN Lab Results  Component Value Date   CHOL 251 (H) 09/18/2017   TRIG 124 09/18/2017   HDL 66 09/18/2017   CHOLHDL 3.8 09/18/2017   VLDL 25 09/18/2017   LDLCALC 160 (H) 09/18/2017   LDLCALC 43 05/14/2014    Physical Findings: AIMS: Facial and Oral Movements Muscles of Facial Expression: None, normal Lips and Perioral Area: None, normal Jaw: None, normal Tongue: None, normal,Extremity Movements Upper (arms, wrists, hands, fingers): None, normal Lower (legs, knees, ankles, toes): None, normal, Trunk Movements Neck, shoulders, hips: None, normal, Overall Severity Severity of abnormal movements (highest score from questions above): None, normal Incapacitation due to abnormal movements: None, normal Patient's awareness of abnormal movements (rate only patient's report): No Awareness, Dental Status Current problems with teeth and/or dentures?: No Does patient usually wear dentures?: No  CIWA:  CIWA-Ar Total: 2 COWS:  COWS Total Score: 2  Musculoskeletal: Strength & Muscle Tone: within normal limits Gait & Station: normal Patient leans: N/A  Psychiatric Specialty Exam: Physical Exam  Nursing note and vitals reviewed. Psychiatric: She has a normal mood and affect. Her speech is normal and behavior is normal. Thought content normal. Cognition and memory are normal. She expresses impulsivity.    Review of Systems  Neurological: Negative.   Psychiatric/Behavioral: Negative.   All other systems reviewed and are negative.   Blood pressure (!) 124/52, pulse 81, temperature (!) 97.4 F (36.3 C), temperature source Oral, resp. rate 20, height 5\' 3"  (1.6 m), weight 72 kg (158 lb 11.7 oz), SpO2 97  %.Body mass index is 28.12 kg/m.  General Appearance: Casual  Eye Contact:  Good  Speech:  Clear and Coherent  Volume:  Normal  Mood:  Irritable  Affect:  Congruent  Thought Process:  Goal Directed and Descriptions of Associations: Intact  Orientation:  Full (Time, Place, and Person)  Thought Content:  WDL  Suicidal Thoughts:  No  Homicidal Thoughts:  No  Memory:  Immediate;   Fair Recent;   Fair Remote;   Fair  Judgement:  Fair  Insight:  Fair  Psychomotor Activity:  Normal  Concentration:  Concentration: Fair and Attention Span: Fair  Recall:  [employer] of Knowledge:  Fair  Language:  Fair  Akathisia:  No  Handed:  Right  AIMS (if indicated):     Assets:  Communication Skills Desire for Improvement Financial Resources/Insurance Resilience Social Support  ADL's:  Intact  Cognition:  WNL  Sleep:  Number of Hours: 3.15     Treatment Plan Summary: Daily contact with patient to assess and evaluate symptoms and progress in treatment and Medication management  Ms. Tarlton is a 72 year old female with a history of bipolar illness and ESRD admitted for agitation. The family reports frequent agitated behavior and believes that her bipolar is not under control.   #Mood/psychosis -continue Tegretol at a higher dose 200 mg TID, level 3.7  -continue Zyprexa 15 mg nightly -increase Haldol to 5 mg nightly, continue haldol 2 mg in am due to excessive sedation  #Social -there is a new HCPOA scanned on chart -her son is designated -patient expressly refuses ECT  #Insomnia -Ativan 2 mg nightly  #UTI -continue Keflex 250 mg TID  #ESRD, on dialysis MWF -nephrology help is greatly appreciated -continue Midodrin 10 mg daily  #HTN -Metoprolol 25 mg BID  #Restless legs -continue Requip 2 mg at 15:00 daily  #GERD -continue Pepcid 20 mg daily and Bentyl 20 mg TID  #Metabolic syndrome monitoring -labs were obtained recently  #Disposition -discharge to home  with family -follow up with Dr. Thurmond Butts on June 26 ay 4:30    Orson Slick, MD 12/19/2017, 12:51 PM

## 2017-12-19 NOTE — Progress Notes (Signed)
Patient alert and oriented x 4, no distress noted, denies pain and discomfort, affect is flat but brightens upon approach. 15 minutes safety checks maintained will continue to monitor.

## 2017-12-19 NOTE — Progress Notes (Signed)
Patient AV fistula assessed and redressed. Site is well healed without any complication. Scant serious drainage on old bandage. Palpated thrill is palpatiable and bruit is audible. Patient tolerated treatment well.

## 2017-12-19 NOTE — B[facility] Group Notes (Signed)
LCSW Group Therapy Note  12/19/2017 1:15pm  Type of Therapy and Topic:  Group Therapy:  Cognitive Distortions  Participation Level:  Did Not Attend   Description of Group:    Patients in this group will be introduced to the topic of cognitive distortions.  Patients will identify and describe cognitive distortions, describe the feelings these distortions create for them.  Patients will identify one or more situations in their personal life where they have cognitively distorted thinking and will verbalize challenging this cognitive distortion through positive thinking skills.  Patients will practice the skill of using positive affirmations to challenge cognitive distortions using affirmation cards.    Therapeutic Goals:  1. Patient will identify two or more cognitive distortions they have used 2. Patient will identify one or more emotions that stem from use of a cognitive distortion 3. Patient will demonstrate use of a positive affirmation to counter a cognitive distortion through discussion and/or role play. 4. Patient will describe one way cognitive distortions can be detrimental to wellness   Summary of Patient Progress: Pt was invited to attend group but chose not to attend. CSW will continue to encourage pt to attend group throughout their admission.      Therapeutic Modalities:   Cognitive Behavioral Therapy Motivational Interviewing   [name]  CUEBAS-COLON, LCSW 12/19/2017 12:39 PM

## 2017-12-19 NOTE — Plan of Care (Signed)
Patient is improving in all areas , continued on dialysis and maintaining a stable weight , stable on her feet , no concerns voiced , patient have an effective coping skills and maintaining safety and 15 minute rounding is In progress, patient denies any SI/HI and no signs of AVH. Problem: Coping: Goal: Ability to identify and develop effective coping behavior will improve Outcome: Progressing Goal: Ability to interact with others will improve Outcome: Progressing Goal: Demonstration of participation in decision-making regarding own care will improve Outcome: Progressing Goal: Ability to use eye contact when communicating with others will improve Outcome: Progressing   Problem: Education: Goal: Utilization of techniques to improve thought processes will improve Outcome: Progressing Goal: Knowledge of the prescribed therapeutic regimen will improve Outcome: Progressing   Problem: Health Behavior/Discharge Planning: Goal: Ability to make decisions will improve Outcome: Progressing Goal: Compliance with therapeutic regimen will improve Outcome: Progressing   Problem: Education: Goal: Knowledge of General Education information will improve Outcome: Progressing   Problem: Health Behavior/Discharge Planning: Goal: Ability to manage health-related needs will improve Outcome: Progressing   Problem: Activity: Goal: Risk for activity intolerance will decrease Outcome: Progressing   Problem: Nutrition: Goal: Adequate nutrition will be maintained Outcome: Progressing   Problem: Coping: Goal: Level of anxiety will decrease Outcome: Progressing   Problem: Elimination: Goal: Will not experience complications related to bowel motility Outcome: Progressing Goal: Will not experience complications related to urinary retention Outcome: Progressing   Problem: Pain Managment: Goal: General experience of comfort will improve Outcome: Progressing   Problem: Safety: Goal: Ability to  remain free from injury will improve Outcome: Progressing   Problem: Skin Integrity: Goal: Risk for impaired skin integrity will decrease Outcome: Progressing

## 2017-12-20 NOTE — Progress Notes (Signed)
[facility]  ROUNDING NOTE   Subjective:  Patient seen down psychiatry. Appears to be doing well. Due for dialysis again tomorrow.  Objective:  Vital signs in last 24 hours:  Temp:  [97.7 F (36.5 C)] 97.7 F (36.5 C) (06/02 0646) Pulse Rate:  [69-91] 69 (06/02 1128) BP: (148-150)/(60-70) 148/69 (06/02 1128) SpO2:  [95 %] 95 % (06/02 0646)  Weight change:  Filed Weights   12/15/17 2223 12/16/17 1234  Weight: 71.7 kg (158 lb) 72 kg (158 lb 11.7 oz)    Intake/Output: I/O last 3 completed shifts: In: 66 [P.O.:720] Out: -    Intake/Output this shift:  Total I/O In: 240 [P.O.:240] Out: -   Physical Exam: General: No acute distress  Head: Normocephalic, atraumatic. Moist oral mucosal membranes  Eyes: Anicteric  Neck: Supple, trachea midline  Lungs:  Clear to auscultation, normal effort  Heart: S1S2 no rubs  Abdomen:  Soft, nontender, bowel sounds present  Extremities: No peripheral edema.  Neurologic: Awake, alert, following commands  Skin: No lesions  Access: LUE AVF    Basic Metabolic Panel: Recent Labs  Lab 12/14/17 2240 12/16/17 1341 12/16/17 1719 12/18/17 1235  NA 136 138 140  --   K 3.3* 3.2* 3.1*  --   CL 98* 98* 96*  --   CO2 28 27 32  --   GLUCOSE 119* 129* 116*  --   BUN 10 21* 7  --   CREATININE 3.03* 4.92* 2.27*  --   CALCIUM 8.8* 9.0 9.2  --   PHOS  --  4.3 2.3* 5.2*    Liver Function Tests: Recent Labs  Lab 12/14/17 2240 12/16/17 1341 12/16/17 1719  AST 24  --   --   ALT 12*  --   --   ALKPHOS 68  --   --   BILITOT 0.4  --   --   PROT 6.7  --   --   ALBUMIN 3.8 3.9 4.4   No results for input(s): LIPASE, AMYLASE in the last 168 hours. No results for input(s): AMMONIA in the last 168 hours.  CBC: Recent Labs  Lab 12/14/17 2240 12/16/17 1341 12/16/17 1719  WBC 7.9 7.3 6.6  NEUTROABS 6.2  --   --   HGB 11.8* 11.2* 13.0  HCT 34.8* 33.4* 38.0  MCV 101.8* 103.0* 103.0*  PLT 252 208 234    Cardiac  Enzymes: No results for input(s): CKTOTAL, CKMB, CKMBINDEX, TROPONINI in the last 168 hours.  BNP: Invalid input(s): POCBNP  CBG: Recent Labs  Lab 12/16/17 2329  GLUCAP 115*    Microbiology: Results for orders placed or performed during the hospital encounter of 09/01/17  Urine culture     Status: Abnormal   Collection Time: 09/01/17  4:32 PM  Result Value Ref Range Status   Specimen Description   Final    URINE, RANDOM Performed at [facility], [address], [place], [place] [address]    Special Requests   Final    NONE Performed at [facility], [address] [place], [place] [address]    Culture MULTIPLE SPECIES PRESENT, SUGGEST RECOLLECTION (A)  Final   Report Status 09/03/2017 FINAL  Final  Culture, blood (routine x 2)     Status: None   Collection Time: 09/01/17  6:05 PM  Result Value Ref Range Status   Specimen Description BLOOD RIGHT HAND  Final   Special Requests   Final    BOTTLES DRAWN AEROBIC AND ANAEROBIC Blood  Culture results may not be optimal due to an excessive volume of blood received in culture bottles   Culture   Final    NO GROWTH 5 DAYS Performed at [facility], [address] [place], [place] [address]    Report Status 09/06/2017 FINAL  Final  Culture, blood (routine x 2)     Status: None   Collection Time: 09/01/17  6:34 PM  Result Value Ref Range Status   Specimen Description BLOOD RAC  Final   Special Requests   Final    BOTTLES DRAWN AEROBIC AND ANAEROBIC Blood Culture adequate volume   Culture   Final    NO GROWTH 5 DAYS Performed at [facility], [address] [place], [place] [address]    Report Status 09/06/2017 FINAL  Final    Coagulation Studies: No results for input(s): LABPROT, INR in the last 72 hours.  Urinalysis: No results for input(s): COLORURINE, LABSPEC, PHURINE, GLUCOSEU, HGBUR, BILIRUBINUR, KETONESUR, PROTEINUR, UROBILINOGEN, NITRITE, LEUKOCYTESUR in the last 72  hours.  Invalid input(s): APPERANCEUR    Imaging: No results found.   Medications:   . sodium chloride    . sodium chloride    . sodium chloride    . sodium chloride     . brimonidine  1 drop Both Eyes BID  . carbamazepine  200 mg Oral TID  . cephALEXin  250 mg Oral Q8H  . dicyclomine  20 mg Oral TID AC  . famotidine  20 mg Oral Daily  . haloperidol  2 mg Oral BH-q7a  . haloperidol  5 mg Oral QHS  . LORazepam  2 mg Oral QHS  . metoprolol tartrate  25 mg Oral BID  . midodrine  10 mg Oral Daily  . OLANZapine  15 mg Oral QHS  . rOPINIRole  2 mg Oral Daily  . timolol  1 drop Both Eyes BID   sodium chloride, sodium chloride, sodium chloride, sodium chloride, acetaminophen, alteplase, alteplase, alum & mag hydroxide-simeth, heparin, heparin, lidocaine (PF), lidocaine (PF), lidocaine-prilocaine, magnesium hydroxide, pentafluoroprop-tetrafluoroeth  Assessment/ Plan:  72 y.o. female with end stage renal disease on hemodialysis, diverticulosis, hypotension, bipolar disorder, polycystic kidney disease   [facility]/MWF  1.  ESRD on HD.  We will plan for hemodialysis tomorrow per her usual schedule.  2.  Anemia chronic kidney disease.  Epogen remains on hold as most recent hemoglobin was 13.  3.  SHPTH.  Recheck serum phosphorus tomorrow.  4.  Hypotension.  Continue midodrine as prescribed.  5.  Depression.  Management as per psychiatry.  Possible discharge on Tuesday.   LOS: 5 Niles Ess 6/2/20193:02 PM

## 2017-12-20 NOTE — [facility] Group Notes (Signed)
LCSW Group Therapy Note 12/20/2017 1:15pm  Type of Therapy and Topic: Group Therapy: Feelings Around Returning Home & Establishing a Supportive Framework and Supporting Oneself When Supports Not Available  Participation Level: Did Not Attend  Description of Group:  Patients first processed thoughts and feelings about upcoming discharge. These included fears of upcoming changes, lack of change, new living environments, judgements and expectations from others and overall stigma of mental health issues. The group then discussed the definition of a supportive framework, what that looks and feels like, and how do to discern it from an unhealthy non-supportive network. The group identified different types of supports as well as what to do when your family/friends are less than helpful or unavailable  Therapeutic Goals  1. Patient will identify one healthy supportive network that they can use at discharge. 2. Patient will identify one factor of a supportive framework and how to tell it from an unhealthy network. 3. Patient able to identify one coping skill to use when they do not have positive supports from others. 4. Patient will demonstrate ability to communicate their needs through discussion and/or role plays.  Summary of Patient Progress:  Pt was invited to attend group but chose not to attend. CSW will continue to encourage pt to attend group throughout their admission.   Therapeutic Modalities Cognitive Behavioral Therapy Motivational Interviewing   Joyce [name]  CUEBAS-COLON, LCSW 12/20/2017 12:59 PM

## 2017-12-20 NOTE — Plan of Care (Signed)
Patient is alert to self and place. Denies SI, HI and AVH. Problem: Coping: Goal: Ability to interact with others will improve Outcome: Progressing   Problem: Coping: Goal: Ability to interact with others will improve Outcome: Progressing Goal: Demonstration of participation in decision-making regarding own care will improve Outcome: Not Progressing Goal: Ability to use eye contact when communicating with others will improve Outcome: Progressing   Problem: Education: Goal: Utilization of techniques to improve thought processes will improve Outcome: Progressing Goal: Knowledge of the prescribed therapeutic regimen will improve Outcome: Progressing   Problem: Health Behavior/Discharge Planning: Goal: Ability to make decisions will improve Outcome: Progressing Goal: Compliance with therapeutic regimen will improve Outcome: Progressing   Patient does have to be reoriented at times about the time of day and if meals have been given. Patient is compliant with medications, dose come out of her room to wash clothing and eat meals. Patient is pleasant, spends most of the day in her room in the bed. Patient rates pain 0/10. Nurse will continue to monitor.

## 2017-12-20 NOTE — [facility] Group Notes (Signed)
[place] Group Notes:  (Nursing/MHT/Case Management/Adjunct)  Date:  12/20/2017  Time:  9:05 PM  Type of Therapy:  Group Therapy  Participation Level:  Active  Participation Quality:  Appropriate  Affect:  Appropriate  Cognitive:  Appropriate  Insight:  Good  Engagement in Group:  Engaged  Modes of Intervention:  Discussion  Summary of Progress/Problems:  Joyce [name] 12/20/2017, 9:05 PM

## 2017-12-20 NOTE — Plan of Care (Signed)
Patient is weak and frail, improved in all areas, takes her medications without any noticeable side effects, socialization is minimal with peers, appetite is good consuming more than 60% of meals and well hydrated in fluids and juices,, continued in dialysis MWF, contract for safety of self and others ,denies any SI/HI, patient has periods of confusion,, teach patient safety acknowledged no distress, 15 minute rounding continue. Problem: Coping: Goal: Ability to identify and develop effective coping behavior will improve Outcome: Progressing Goal: Ability to interact with others will improve Outcome: Progressing Goal: Demonstration of participation in decision-making regarding own care will improve Outcome: Progressing Goal: Ability to use eye contact when communicating with others will improve Outcome: Progressing   Problem: Education: Goal: Utilization of techniques to improve thought processes will improve Outcome: Progressing Goal: Knowledge of the prescribed therapeutic regimen will improve Outcome: Progressing   Problem: Health Behavior/Discharge Planning: Goal: Ability to make decisions will improve Outcome: Progressing Goal: Compliance with therapeutic regimen will improve Outcome: Progressing   Problem: Education: Goal: Knowledge of General Education information will improve Outcome: Progressing   Problem: Health Behavior/Discharge Planning: Goal: Ability to manage health-related needs will improve Outcome: Progressing   Problem: Activity: Goal: Risk for activity intolerance will decrease Outcome: Progressing   Problem: Nutrition: Goal: Adequate nutrition will be maintained Outcome: Progressing   Problem: Coping: Goal: Level of anxiety will decrease Outcome: Progressing   Problem: Elimination: Goal: Will not experience complications related to bowel motility Outcome: Progressing Goal: Will not experience complications related to urinary retention Outcome:  Progressing   Problem: Pain Managment: Goal: General experience of comfort will improve Outcome: Progressing   Problem: Safety: Goal: Ability to remain free from injury will improve Outcome: Progressing   Problem: Skin Integrity: Goal: Risk for impaired skin integrity will decrease Outcome: Progressing   Problem: Safety: Goal: Ability to disclose and discuss suicidal ideas will improve Outcome: Progressing Goal: Ability to identify and utilize support systems that promote safety will improve Outcome: Progressing

## 2017-12-20 NOTE — Progress Notes (Signed)
[facility] MD Progress Note  12/20/2017 1:18 PM Earma Nicolaou  MRN:  [id number]  Subjective:    Ms. Droege has no complaints today. She denies any symptoms of depression, anxiety or psychosis. She is compliant with treatment. There are no somatic problems, no side effects from medications.  Spoke with [name], daughter in Sports coach. She will visit tonight. Family meeting on Tuesday at 9:00 am.  Principal Problem: Bipolar I disorder, most recent episode (or current) manic ([place]) Diagnosis:   Patient Active Problem List   Diagnosis Date Noted  . Bipolar affective disorder, current episode depressed with psychotic symptoms ([place]) [F31.2] 09/17/2017    Priority: High  . Bipolar I disorder, most recent episode (or current) manic ([place]) [F31.10] 09/02/2017    Priority: High  . UTI (urinary tract infection) [N39.0] 12/16/2017  . Bipolar 1 disorder, depressed ([place]) [F31.9] 12/15/2017  . Palliative care encounter [Z51.5]   . Acute delirium [R41.0] 09/02/2017  . Altered mental status [R41.82]   . Acute encephalopathy [G93.40] 09/01/2017  . ESRD on dialysis ([place]) [N18.6, Z99.2] 04/06/2017  . Complication of vascular access for dialysis [T82.9XXA] 04/06/2017  . Hematuria [R31.9] 12/18/2016  . Polycystic kidney [Q61.3] 10/12/2016  . Ruptured cyst of kidney [Q61.00] 10/03/2016  . HTN (hypertension) [I10] 10/01/2016  . Chronic kidney disease [N18.9] 10/01/2016  . Anemia associated with chronic renal failure [N18.9, D63.1] 09/24/2016  . Acute kidney insufficiency [N28.9] 07/28/2016  . Noninfectious diarrhea [K52.9]   . Benign neoplasm of cecum [D12.0]   . Benign neoplasm of ascending colon [D12.2]   . Diarrhea [R19.7]   . Nausea [R11.0]   . Gastric polyp [K31.7]   . Chronic constipation [K59.09] 12/27/2014  . Nausea with vomiting [R11.2] 12/27/2014  . Left sided abdominal pain [R10.9] 12/27/2014  . Personal history of colonic polyps [Z86.010] 05/10/2013   Total Time spent with patient: 20  minutes  Past Psychiatric History: bipolar illness  Past Medical History:  Past Medical History:  Diagnosis Date  . Anal fissure   . Bipolar affective disorder ([place])   . CKD (chronic kidney disease)    Dr Holley Raring [name] 4  . Colon polyps   . Diverticulitis   . Diverticulitis   . Family history of adverse reaction to anesthesia    mom - PONV  . GERD (gastroesophageal reflux disease)   . Headache    migraines - none over 10 yrs  . Heart murmur   . History of hiatal hernia   . Hypertension   . Pancreatitis    Valproic acid  . Vertigo     Past Surgical History:  Procedure Laterality Date  . A/V FISTULAGRAM Left 04/14/2017   Procedure: A/V Fistulagram;  Surgeon: Katha Cabal, MD;  Location: [facility];  Service: Cardiovascular;  Laterality: Left;  . A/V FISTULAGRAM Left 06/09/2017   Procedure: A/V FISTULAGRAM;  Surgeon: Katha Cabal, MD;  Location: [facility];  Service: Cardiovascular;  Laterality: Left;  . A/V FISTULAGRAM Left 11/02/2017   Procedure: A/V FISTULAGRAM;  Surgeon: Algernon Huxley, MD;  Location: [facility];  Service: Cardiovascular;  Laterality: Left;  . A/V SHUNT INTERVENTION N/A 11/02/2017   Procedure: A/V SHUNT INTERVENTION;  Surgeon: Algernon Huxley, MD;  Location: [facility];  Service: Cardiovascular;  Laterality: N/A;  . ABDOMINAL HYSTERECTOMY  1990 ?  . AV FISTULA PLACEMENT  4/30  . BREAST EXCISIONAL BIOPSY Left 1994   neg surgical bx  . CHOLECYSTECTOMY  2003  .  COLONOSCOPY  2014   Dr. Jamal Collin  . COLONOSCOPY WITH PROPOFOL N/A 09/24/2015   Procedure: COLONOSCOPY WITH random colon byopies.;  Surgeon: Lucilla Lame, MD;  Location: [place];  Service: Endoscopy;  Laterality: N/A;  . ESOPHAGOGASTRODUODENOSCOPY (EGD) WITH PROPOFOL N/A 09/24/2015   Procedure: ESOPHAGOGASTRODUODENOSCOPY (EGD) ;  Surgeon: Lucilla Lame, MD;  Location: [facility];  Service: Endoscopy;  Laterality: N/A;  . EYE SURGERY    .  PERIPHERAL VASCULAR CATHETERIZATION N/A 05/29/2015   Procedure: A/V Shuntogram/Fistulagram;  Surgeon: Katha Cabal, MD;  Location: [facility];  Service: Cardiovascular;  Laterality: N/A;  . PERIPHERAL VASCULAR CATHETERIZATION N/A 05/29/2015   Procedure: A/V Shunt Intervention;  Surgeon: Katha Cabal, MD;  Location: [facility];  Service: Cardiovascular;  Laterality: N/A;  . POLYPECTOMY  09/24/2015   Procedure: POLYPECTOMY INTESTINAL;  Surgeon: Lucilla Lame, MD;  Location: [facility];  Service: Endoscopy;;  cecal polyp ascending polyp   Family History:  Family History  Problem Relation Age of Onset  . Stroke Father   . Hypertension Father   . Breast cancer Other   . Colon cancer Neg Hx   . Liver disease Neg Hx    Family Psychiatric  History: none Social History:  Social History   Substance and Sexual Activity  Alcohol Use No  . Alcohol/week: 0.0 oz     Social History   Substance and Sexual Activity  Drug Use No    Social History   Socioeconomic History  . Marital status: Single    Spouse name: Not on file  . Number of children: 1  . Years of education: Not on file  . Highest education level: Not on file  Occupational History  . Occupation: Surveyor, quantity: [employer]  Social Needs  . Financial resource strain: Not on file  . Food insecurity:    Worry: Not on file    Inability: Not on file  . Transportation needs:    Medical: Not on file    Non-medical: Not on file  Tobacco Use  . Smoking status: Never Smoker  . Smokeless tobacco: Never Used  Substance and Sexual Activity  . Alcohol use: No    Alcohol/week: 0.0 oz  . Drug use: No  . Sexual activity: Not Currently  Lifestyle  . Physical activity:    Days per week: Not on file    Minutes per session: Not on file  . Stress: Not on file  Relationships  . Social connections:    Talks on phone: Not on file    Gets together: Not on file    Attends religious service: Not on  file    Active member of club or organization: Not on file    Attends meetings of clubs or organizations: Not on file    Relationship status: Not on file  Other Topics Concern  . 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   Additional Social History:                         Sleep: Fair  Appetite:  Fair  Current Medications: Current Facility-Administered Medications  Medication Dose Route Frequency Provider Last Rate Last Dose  . 0.9 %  sodium chloride infusion  100 mL Intravenous PRN Lateef, Munsoor, MD      . 0.9 %  sodium chloride infusion  100 mL Intravenous PRN Lateef, Munsoor, MD      . 0.9 %  sodium chloride infusion  100 mL Intravenous PRN Lateef, Munsoor, MD      . 0.9 %  sodium chloride infusion  100 mL Intravenous PRN Lateef, Munsoor, MD      . acetaminophen (TYLENOL) tablet 325 mg  325 mg Oral Q6H PRN Kaizer Dissinger B, MD   325 mg at 12/19/17 0129  . alteplase (CATHFLO ACTIVASE) injection 2 mg  2 mg Intracatheter Once PRN Lateef, Munsoor, MD      . alteplase (CATHFLO ACTIVASE) injection 2 mg  2 mg Intracatheter Once PRN Lateef, Munsoor, MD      . alum & mag hydroxide-simeth (MAALOX/MYLANTA) 841-660-63 MG/5ML suspension 30 mL  30 mL Oral Q4H PRN Clapacs, [name], MD      . brimonidine (ALPHAGAN) 0.2 % ophthalmic solution 1 drop  1 drop Both Eyes BID Royce Stegman B, MD   1 drop at 12/20/17 0929  . carbamazepine (TEGRETOL) tablet 200 mg  200 mg Oral TID Encarnacion Scioneaux B, MD   200 mg at 12/20/17 1220  . cephALEXin (KEFLEX) capsule 250 mg  250 mg Oral Q8H Delayna Sparlin B, MD   250 mg at 12/20/17 1216  . dicyclomine (BENTYL) tablet 20 mg  20 mg Oral TID AC Clapacs, [name], MD   20 mg at 12/20/17 1216  . famotidine (PEPCID) tablet 20 mg  20 mg Oral Daily Clapacs, [name], MD   20 mg at 12/20/17 0160  . haloperidol (HALDOL) tablet 2 mg  2 mg Oral BH-q7a Crescencio Jozwiak,  Chanel Mckesson B, MD   2 mg at 12/20/17 1093  . haloperidol (HALDOL) tablet 5 mg  5 mg Oral QHS Jammy Plotkin B, MD   5 mg at 12/19/17 2119  . heparin injection 1,000 Units  1,000 Units Dialysis PRN Lateef, Munsoor, MD      . heparin injection 1,000 Units  1,000 Units Dialysis PRN Lateef, Munsoor, MD      . lidocaine (PF) (XYLOCAINE) 1 % injection 5 mL  5 mL Intradermal PRN Lateef, Munsoor, MD      . lidocaine (PF) (XYLOCAINE) 1 % injection 5 mL  5 mL Intradermal PRN Lateef, Munsoor, MD      . lidocaine-prilocaine (EMLA) cream 1 application  1 application Topical PRN Lateef, Munsoor, MD      . LORazepam (ATIVAN) tablet 2 mg  2 mg Oral QHS Eyvonne Burchfield B, MD   2 mg at 12/19/17 2118  . magnesium hydroxide (MILK OF MAGNESIA) suspension 30 mL  30 mL Oral Daily PRN Clapacs, [name], MD      . metoprolol tartrate (LOPRESSOR) tablet 25 mg  25 mg Oral BID Clapacs, [name], MD   25 mg at 12/20/17 0929  . midodrine (PROAMATINE) tablet 10 mg  10 mg Oral Daily Clapacs, [name], MD   10 mg at 12/20/17 0937  . OLANZapine (ZYPREXA) tablet 15 mg  15 mg Oral QHS Clapacs, [name], MD   15 mg at 12/19/17 2118  . pentafluoroprop-tetrafluoroeth (GEBAUERS) aerosol 1 application  1 application Topical PRN Lateef, Munsoor, MD      . rOPINIRole (REQUIP) tablet 2 mg  2 mg Oral Daily Disney Ruggiero B, MD   2 mg at 12/19/17 1440  . timolol (TIMOPTIC) 0.5 % ophthalmic solution 1 drop  1 drop Both Eyes BID Clapacs, [name], MD   1 drop at 12/20/17 2355    Lab Results:  No results found for this or any previous visit (from the past 48 hour(s)).  Blood Alcohol level:  Lab Results  Component Value Date   ETH <10 12/14/2017   ETH <10 54/03/8118    Metabolic Disorder Labs: Lab Results  Component Value Date   HGBA1C 4.8 09/18/2017   MPG 91.06 09/18/2017   No results found for: PROLACTIN Lab Results  Component Value Date   CHOL 251 (H) 09/18/2017   TRIG 124 09/18/2017   HDL 66 09/18/2017   CHOLHDL 3.8  09/18/2017   VLDL 25 09/18/2017   LDLCALC 160 (H) 09/18/2017   LDLCALC 43 05/14/2014    Physical Findings: AIMS: Facial and Oral Movements Muscles of Facial Expression: None, normal Lips and Perioral Area: None, normal Jaw: None, normal Tongue: None, normal,Extremity Movements Upper (arms, wrists, hands, fingers): None, normal Lower (legs, knees, ankles, toes): None, normal, Trunk Movements Neck, shoulders, hips: None, normal, Overall Severity Severity of abnormal movements (highest score from questions above): None, normal Incapacitation due to abnormal movements: None, normal Patient's awareness of abnormal movements (rate only patient's report): No Awareness, Dental Status Current problems with teeth and/or dentures?: No Does patient usually wear dentures?: No  CIWA:  CIWA-Ar Total: 2 COWS:  COWS Total Score: 2  Musculoskeletal: Strength & Muscle Tone: within normal limits Gait & Station: normal Patient leans: N/A  Psychiatric Specialty Exam: Physical Exam  Nursing note and vitals reviewed. Psychiatric: She has a normal mood and affect. Her speech is normal and behavior is normal. Thought content normal. Cognition and memory are normal. She expresses impulsivity.    Review of Systems  Neurological: Negative.   Psychiatric/Behavioral: Negative.   All other systems reviewed and are negative.   Blood pressure (!) 148/69, pulse 69, temperature 97.7 F (36.5 C), temperature source Oral, resp. rate 20, height 5\' 3"  (1.6 m), weight 72 kg (158 lb 11.7 oz), SpO2 95 %.Body mass index is 28.12 kg/m.  General Appearance: Casual  Eye Contact:  Good  Speech:  Clear and Coherent  Volume:  Normal  Mood:  Euthymic  Affect:  Appropriate  Thought Process:  Goal Directed and Descriptions of Associations: Intact  Orientation:  Full (Time, Place, and Person)  Thought Content:  WDL  Suicidal Thoughts:  No  Homicidal Thoughts:  No  Memory:  Immediate;   Fair Recent;   Fair Remote;    Fair  Judgement:  Fair  Insight:  Fair  Psychomotor Activity:  Normal  Concentration:  Concentration: Fair and Attention Span: Fair  Recall:  [employer] of Knowledge:  Fair  Language:  Fair  Akathisia:  No  Handed:  Right  AIMS (if indicated):     Assets:  Communication Skills Desire for Improvement Financial Resources/Insurance Housing Resilience Social Support  ADL's:  Intact  Cognition:  WNL  Sleep:  Number of Hours: 8     Treatment Plan Summary: Daily contact with patient to assess and evaluate symptoms and progress in treatment and Medication management    Ms. Allbritton is a 72 year old female with a history of bipolar illness and ESRD admitted for agitation. The family reports frequent agitated behavior and believes that her bipolar is not under control.  #Mood/psychosis -continue Tegretol at a higher dose 200 mg TID, level 3.7 -continue Zyprexa 15 mg nightly -continue haldol 2 mg in am, 5 mg qhs  #Social -there is a new HCPOA scanned on chart -her son is designated -patient expressly refuses ECT  #Insomnia -Ativan 2 mg nightly  #UTI -  continue Keflex 250 mg TID  #ESRD, on dialysis MWF -nephrology help is greatly appreciated -continue Midodrin 10 mg daily  #HTN -Metoprolol 25 mg BID  #Restless legs -continue Requip 2 mgat 15:00 daily  #GERD -continue Pepcid 20 mg daily and Bentyl 20 mg TID  #Metabolic syndrome monitoring -labs were obtained recently  #Disposition -discharge to home with family -follow up with Dr. Thurmond Butts on June 26 ay 4:30       Orson Slick, MD 12/20/2017, 1:18 PM

## 2017-12-20 NOTE — Plan of Care (Addendum)
Patient found in day room visiting with family upon my arrival. Patient is visible but not social this evening. Denies all complaints including SI/HI/AVH. Thought processes are slow. Memory is impaired. Patient forgets things moments after it happened. Unable to process information adequately. Patient is pleasant and polite. Reports eating and voiding adequately. Compliant with HS medications and staff direction. Q 15 minute checks maintained. Will continue to monitor throughout the shift. Patient slept 7.25 hours. No apparent distress. Will endorse care to oncoming shift.  Problem: Coping: Goal: Ability to interact with others will improve Outcome: Progressing Goal: Ability to use eye contact when communicating with others will improve Outcome: Progressing   Problem: Education: Goal: Knowledge of the prescribed therapeutic regimen will improve Outcome: Progressing   Problem: Health Behavior/Discharge Planning: Goal: Compliance with therapeutic regimen will improve Outcome: Progressing

## 2017-12-21 LAB — URINALYSIS, COMPLETE (UACMP) WITH MICROSCOPIC
Bilirubin Urine: NEGATIVE
GLUCOSE, UA: NEGATIVE mg/dL
HGB URINE DIPSTICK: NEGATIVE
Ketones, ur: NEGATIVE mg/dL
Nitrite: NEGATIVE
Protein, ur: NEGATIVE mg/dL
SPECIFIC GRAVITY, URINE: 1.006 (ref 1.005–1.030)
pH: 7 (ref 5.0–8.0)

## 2017-12-21 MED ORDER — CARBAMAZEPINE 200 MG PO TABS: 200.0000 mg | ORAL_TABLET | Freq: Three times a day (TID) | ORAL | 1 refills | Status: DC

## 2017-12-21 NOTE — Tx Team (Signed)
Interdisciplinary Treatment and Diagnostic Plan Update  12/21/2017 Time of Session: 10:35 AM Lillybeth Tal MRN: [id number]  Principal Diagnosis: Bipolar I disorder, most recent episode (or current) manic ([place])  Secondary Diagnoses: Principal Problem:   Bipolar I disorder, most recent episode (or current) manic ([place]) Active Problems:   HTN (hypertension)   ESRD on dialysis [facility])   UTI (urinary tract infection)   Current Medications:  Current Facility-Administered Medications  Medication Dose Route Frequency Provider Last Rate Last Dose  . 0.9 %  sodium chloride infusion  100 mL Intravenous PRN Lateef, Munsoor, MD      . 0.9 %  sodium chloride infusion  100 mL Intravenous PRN Lateef, Munsoor, MD      . 0.9 %  sodium chloride infusion  100 mL Intravenous PRN Lateef, Munsoor, MD      . 0.9 %  sodium chloride infusion  100 mL Intravenous PRN Lateef, Munsoor, MD      . acetaminophen (TYLENOL) tablet 325 mg  325 mg Oral Q6H PRN [name], [name], MD   325 mg at 12/19/17 0129  . alteplase (CATHFLO ACTIVASE) injection 2 mg  2 mg Intracatheter Once PRN Lateef, Munsoor, MD      . alteplase (CATHFLO ACTIVASE) injection 2 mg  2 mg Intracatheter Once PRN Lateef, Munsoor, MD      . alum & mag hydroxide-simeth (MAALOX/MYLANTA) 962-229-79 MG/5ML suspension 30 mL  30 mL Oral Q4H PRN Clapacs, [name], MD      . brimonidine (ALPHAGAN) 0.2 % ophthalmic solution 1 drop  1 drop Both Eyes BID [name], [name], MD   1 drop at 12/21/17 0906  . carbamazepine (TEGRETOL) tablet 200 mg  200 mg Oral TID [name], [name], MD   200 mg at 12/21/17 1128  . cephALEXin (KEFLEX) capsule 250 mg  250 mg Oral Q8H [name], [name], MD   250 mg at 12/21/17 1347  . dicyclomine (BENTYL) tablet 20 mg  20 mg Oral TID AC Clapacs, [name], MD   20 mg at 12/21/17 1128  . famotidine (PEPCID) tablet 20 mg  20 mg Oral Daily Clapacs, [name], MD   20 mg at 12/21/17 1129  . haloperidol (HALDOL) tablet 2 mg  2 mg Oral  [id number] [name], [name], MD   2 mg at 12/21/17 1129  . haloperidol (HALDOL) tablet 5 mg  5 mg Oral QHS [name], [name], MD   5 mg at 12/20/17 2115  . heparin injection 1,000 Units  1,000 Units Dialysis PRN Lateef, Munsoor, MD      . heparin injection 1,000 Units  1,000 Units Dialysis PRN Lateef, Munsoor, MD      . lidocaine (PF) (XYLOCAINE) 1 % injection 5 mL  5 mL Intradermal PRN Lateef, Munsoor, MD      . lidocaine (PF) (XYLOCAINE) 1 % injection 5 mL  5 mL Intradermal PRN Lateef, Munsoor, MD      . lidocaine-prilocaine (EMLA) cream 1 application  1 application Topical PRN Lateef, Munsoor, MD      . LORazepam (ATIVAN) tablet 2 mg  2 mg Oral QHS [name], [name], MD   2 mg at 12/20/17 2115  . magnesium hydroxide (MILK OF MAGNESIA) suspension 30 mL  30 mL Oral Daily PRN Clapacs, [name], MD      . metoprolol tartrate (LOPRESSOR) tablet 25 mg  25 mg Oral BID Clapacs, [name], MD   25 mg at 12/21/17 0803  . midodrine (PROAMATINE) tablet 10 mg  10 mg Oral Daily Clapacs, [name], MD   10 mg at 12/21/17 1130  . OLANZapine (ZYPREXA) tablet 15 mg  15 mg Oral QHS Clapacs, [name], MD   15 mg at 12/20/17 2115  . pentafluoroprop-tetrafluoroeth (GEBAUERS) aerosol 1 application  1 application Topical PRN Lateef, Munsoor, MD      . rOPINIRole (REQUIP) tablet 2 mg  2 mg Oral Daily [name], [name], MD   2 mg at 12/20/17 1648  . timolol (TIMOPTIC) 0.5 % ophthalmic solution 1 drop  1 drop Both Eyes BID Clapacs, [name], MD   1 drop at 12/21/17 0905   PTA Medications: Medications Prior to Admission  Medication Sig Dispense Refill Last Dose  . acetaminophen (TYLENOL) 325 MG tablet Take 325 mg 2 (two) times daily as needed by mouth for moderate pain or headache.    Not Taking at Unknown time  . brimonidine (ALPHAGAN) 0.2 % ophthalmic solution Place 1 drop into both eyes 2 (two) times daily. 5 mL 1   . carbamazepine (TEGRETOL) 200 MG tablet Take 1 tablet (200 mg total) by mouth 2 (two) times daily.  60 tablet 1   . cholecalciferol (VITAMIN D) 1000 UNITS tablet Take 1,000 Units by mouth daily.   Not Taking at Unknown time  . cyanocobalamin (,VITAMIN B-12,) 1000 MCG/ML injection Inject 1,000 mcg into the muscle every 30 (thirty) days.   Not Taking at Unknown time  . dicyclomine (BENTYL) 20 MG tablet Take 1 tablet (20 mg total) by mouth 3 (three) times daily before meals. (Patient taking differently: Take 20 mg by mouth daily. ) 90 tablet 1 Unknown at Unknown  . epoetin alfa (EPOGEN,PROCRIT) 4000 UNIT/ML injection Inject 1 mL (4,000 Units total) into the vein every Monday, Wednesday, and Friday with hemodialysis. 1 mL 6   . famotidine (PEPCID) 20 MG tablet Take 1 tablet (20 mg total) by mouth daily. 30 tablet 1 Unknown at Unknown  . haloperidol (HALDOL) 5 MG tablet TAKE 1/2 TABLET BY MOUTH IN THE MORNING AND 1 TABLET AT BEDTIME  1 Unknown at Unknown  . lidocaine (LIDODERM) 5 % Place 1 patch onto the skin daily. Remove & Discard patch within 12 hours or as directed by MD 30 patch 0   . lidocaine-prilocaine (EMLA) cream Apply 1 application every Monday, Wednesday, and Friday topically. At dialysis  3 prn at prn  . LORazepam (ATIVAN) 2 MG tablet Take 1 tablet (2 mg total) by mouth at bedtime. 30 tablet 0 Unknown at Unknown  . metoprolol tartrate (LOPRESSOR) 25 MG tablet Take 0.5 tablets (12.5 mg total) by mouth 2 (two) times daily. (Patient taking differently: Take 25 mg by mouth 2 (two) times daily. ) 60 tablet 1 Unknown at Unknown  . midodrine (PROAMATINE) 10 MG tablet Take 1 tablet (10 mg total) by mouth daily. 30 tablet 1 Unknown at Unknown  . multivitamin (RENA-VIT) TABS tablet TAKE 1 TABLET BY MOUTH EVERYDAY AT BEDTIME 30 tablet 0 Unknown at Unknown  . OLANZapine (ZYPREXA) 15 MG tablet Take 1 tablet (15 mg total) by mouth at bedtime. 30 tablet 1 Unknown at Unknown  . pantoprazole (PROTONIX) 40 MG tablet TAKE 1 TABLET EVERY DAY 90 tablet 3   . rOPINIRole (REQUIP) 2 MG tablet Take 1 tablet (2 mg  total) by mouth at bedtime. (Patient taking differently: Take 2 mg by mouth daily. ) 30 tablet 1 Unknown at Unknown  . timolol (TIMOPTIC) 0.5 % ophthalmic solution Place 1 drop into both eyes 2 (two)  times daily. 10 mL 1     Patient Stressors: Financial difficulties Occupational concerns  Patient Strengths: Motivation for treatment/growth Physical Health Supportive family/friends  Treatment Modalities: Medication Management, Group therapy, Case management,  1 to 1 session with clinician, Psychoeducation, Recreational therapy.   Physician Treatment Plan for Primary Diagnosis: Bipolar I disorder, most recent episode (or current) manic ([place]) Long Term Goal(s): Improvement in symptoms so as ready for discharge NA   Short Term Goals: Ability to identify changes in lifestyle to reduce recurrence of condition will improve Ability to verbalize feelings will improve Ability to disclose and discuss suicidal ideas Ability to demonstrate self-control will improve Ability to identify and develop effective coping behaviors will improve Ability to maintain clinical measurements within normal limits will improve Ability to identify triggers associated with substance abuse/mental health issues will improve NA  Medication Management: Evaluate patient's response, side effects, and tolerance of medication regimen.  Therapeutic Interventions: 1 to 1 sessions, Unit Group sessions and Medication administration.  Evaluation of Outcomes: Progressing  Physician Treatment Plan for Secondary Diagnosis: Principal Problem:   Bipolar I disorder, most recent episode (or current) manic ([place]) Active Problems:   HTN (hypertension)   ESRD on dialysis ([place])   UTI (urinary tract infection)  Long Term Goal(s): Improvement in symptoms so as ready for discharge NA   Short Term Goals: Ability to identify changes in lifestyle to reduce recurrence of condition will improve Ability to verbalize feelings will  improve Ability to disclose and discuss suicidal ideas Ability to demonstrate self-control will improve Ability to identify and develop effective coping behaviors will improve Ability to maintain clinical measurements within normal limits will improve Ability to identify triggers associated with substance abuse/mental health issues will improve NA     Medication Management: Evaluate patient's response, side effects, and tolerance of medication regimen.  Therapeutic Interventions: 1 to 1 sessions, Unit Group sessions and Medication administration.  Evaluation of Outcomes: Progressing   RN Treatment Plan for Primary Diagnosis: Bipolar I disorder, most recent episode (or current) manic ([place]) Long Term Goal(s): Knowledge of disease and therapeutic regimen to maintain health will improve  Short Term Goals: Ability to remain free from injury will improve, Ability to identify and develop effective coping behaviors will improve and Compliance with prescribed medications will improve  Medication Management: RN will administer medications as ordered by provider, will assess and evaluate patient's response and provide education to patient for prescribed medication. RN will report any adverse and/or side effects to prescribing provider.  Therapeutic Interventions: 1 on 1 counseling sessions, Psychoeducation, Medication administration, Evaluate responses to treatment, Monitor vital signs and CBGs as ordered, Perform/monitor CIWA, COWS, AIMS and Fall Risk screenings as ordered, Perform wound care treatments as ordered.  Evaluation of Outcomes: Progressing   LCSW Treatment Plan for Primary Diagnosis: Bipolar I disorder, most recent episode (or current) manic ([place]) Long Term Goal(s): Safe transition to appropriate next level of care at discharge, Engage patient in therapeutic group addressing interpersonal concerns.  Short Term Goals: Engage patient in aftercare planning with referrals and resources  and Increase skills for wellness and recovery  Therapeutic Interventions: Assess for all discharge needs, 1 to 1 time with Social worker, Explore available resources and support systems, Assess for adequacy in community support network, Educate family and significant other(s) on suicide prevention, Complete Psychosocial Assessment, Interpersonal group therapy.  Evaluation of Outcomes: Progressing   Progress in Treatment: Attending groups: No. Participating in groups: No. Taking medication as prescribed: Yes. Toleration medication: No. Family/Significant  other contact made: No, will contact:  CSW will contact identified support person when given consent. Patient understands diagnosis: Yes. Discussing patient identified problems/goals with staff: Yes. Medical problems stabilized or resolved: Yes. Denies suicidal/homicidal ideation: Yes. Issues/concerns per patient self-inventory: No. Other: n/a  New problem(s) identified: Yes, Describe:  Pt was been dx with a UTI  New Short Term/Long Term Goal(s):  Patient Goals:  "to get so upset with wanting to go back to me home and my daughter-in-law not wanting to go to church with me"  Discharge Plan or Barriers: Tentative discharge is for pt to return back to her son's home with follow-up services to resume with Dr. Thurmond Butts.  Reason for Continuation of Hospitalization: Anxiety Medication stabilization  Estimated Length of Stay: 3-5 days  Recreational Therapy: Patient Stressors: N/A  Patient Goal: Patient will engage in groups without prompting or encouragement from LRT x3 group sessions within 5 recreation therapy group sessions  Attendees: Patient: 12/21/2017 2:37 PM  Physician: Orson Slick, MD 12/21/2017 2:37 PM  Nursing: Polly Cobia, RN 12/21/2017 2:37 PM  RN Care Manager: 12/21/2017 2:37 PM  Social Worker: Dossie Arbour, LCSW 12/21/2017 2:37 PM  Recreational Therapist: Roanna Epley, LRT 12/21/2017 2:37 PM  Other: Darin Engels, [place]  12/21/2017 2:37 PM  Other:  12/21/2017 2:37 PM  Other: 12/21/2017 2:37 PM    Scribe for Treatment Team: August Saucer, LCSW 12/21/2017 2:37 PM

## 2017-12-21 NOTE — Plan of Care (Signed)
Patient back from dialysis.No distress noted.Dinner offered.

## 2017-12-21 NOTE — Discharge Summary (Signed)
Physician Discharge Summary Note  Patient:  Joyce [name] is an 72 y.o., female MRN:  [id number] DOB:  1946/05/14 Patient phone:  [phone] (home)  Patient address:   [place] [address],  Total Time spent with patient: 20 minutes plus 15 min on documentation and care coordination  Date of Admission:  12/15/2017 Date of Discharge: 12/23/2017  Reason for Admission:  Psychotic break.  History of Present Illness:   Identifying data. Joyce [name] is a 72 year old female with a history of bipolar disorder.  Chief complaint. "I got a little upset."  THERE IS NEW HCPOA DOCUMENT SCANNED INTO THE SYSTEM. THE SON IS HCPOA. THE PATIENT DOES NOT WISH TO HAVE ECT.  History of present illness. Information was obtained from the patient and the chart. The patient was petitioned by her family who was worried that Joyce [name] has not been taking medications and started another bipolar episode as she became argumentative and unreasonable. The patient admits that she has not been herself lately and regrets having arguments with her son and daughter in law. She has been staying with them since discharge from the hospital on April 4. They have been providing excellent care but the patient decided that she no longer wants to stay there as they are "too controlling" and wanted to return to her own house. The family was so frightened with this unreasonable decision that they put her in the hospital. She was discovered to have UTI and was started on antibiotic in the ER. The patient denies any symptoms of depression, anxiety or psychosis. She reports good compliance with medications and dialyses.   She already feels better and has more insight into her situation. She met with treatment team this morning. She admits that she could not handle her medications or dialysis without support from her children. She can list many benefits of living together. She likes her new dialysis team. She admits  that everybody is carrying and supportive but she misses her house and church here.   The patient presents very well today and has good insight into her problems. Unfortunately, last time she was admitted under similar circumstances and rapidly became very severely ill.   Past psychiatric history. Long history of bipolar with several hospitalization and many medication trials. She has been in the care of Dr. Lanetta Inch and is stable on a combination of Zyprexa 15 mg, Tegretol 200 mg BID, Haldol 2.5 mg BID, and Ativan 2 mg nightly.  Family psychiatric history.  Social history. 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 Until recent hospitalization,. She had been living independently alone.   Principal Problem: Bipolar I disorder, most recent episode (or current) manic [facility]) Discharge Diagnoses: Patient Active Problem List   Diagnosis Date Noted  . Bipolar affective disorder, current episode depressed with psychotic symptoms ([place]) [F31.2] 09/17/2017    Priority: High  . Bipolar I disorder, most recent episode (or current) manic ([place]) [F31.10] 09/02/2017    Priority: High  . UTI (urinary tract infection) [N39.0] 12/16/2017  . Bipolar 1 disorder, depressed ([place]) [F31.9] 12/15/2017  . Palliative care encounter [Z51.5]   . Acute delirium [R41.0] 09/02/2017  . Altered mental status [R41.82]   . Acute encephalopathy [G93.40] 09/01/2017  . ESRD on dialysis ([place]) [N18.6, Z99.2] 04/06/2017  . Complication of vascular access for dialysis [T82.9XXA] 04/06/2017  . Hematuria [R31.9] 12/18/2016  . Polycystic kidney [Q61.3] 10/12/2016  . Ruptured cyst of kidney [Q61.00] 10/03/2016  . HTN (  hypertension) [I10] 10/01/2016  . Chronic kidney disease [N18.9] 10/01/2016  . Anemia associated with chronic renal failure [N18.9, D63.1] 09/24/2016  . Acute kidney insufficiency [N28.9] 07/28/2016  . Noninfectious diarrhea [K52.9]   . Benign neoplasm of cecum [D12.0]   .  Benign neoplasm of ascending colon [D12.2]   . Diarrhea [R19.7]   . Nausea [R11.0]   . Gastric polyp [K31.7]   . Chronic constipation [K59.09] 12/27/2014  . Nausea with vomiting [R11.2] 12/27/2014  . Left sided abdominal pain [R10.9] 12/27/2014  . Personal history of colonic polyps [Z86.010] 05/10/2013    Past Medical History:  Past Medical History:  Diagnosis Date  . Anal fissure   . Bipolar affective disorder ([place])   . CKD (chronic kidney disease)    Dr Holley Raring [name] 4  . Colon polyps   . Diverticulitis   . Diverticulitis   . Family history of adverse reaction to anesthesia    mom - PONV  . GERD (gastroesophageal reflux disease)   . Headache    migraines - none over 10 yrs  . Heart murmur   . History of hiatal hernia   . Hypertension   . Pancreatitis    Valproic acid  . Vertigo     Past Surgical History:  Procedure Laterality Date  . A/V FISTULAGRAM Left 04/14/2017   Procedure: A/V Fistulagram;  Surgeon: Katha Cabal, MD;  Location: [facility];  Service: Cardiovascular;  Laterality: Left;  . A/V FISTULAGRAM Left 06/09/2017   Procedure: A/V FISTULAGRAM;  Surgeon: Katha Cabal, MD;  Location: [facility];  Service: Cardiovascular;  Laterality: Left;  . A/V FISTULAGRAM Left 11/02/2017   Procedure: A/V FISTULAGRAM;  Surgeon: Algernon Huxley, MD;  Location: [facility];  Service: Cardiovascular;  Laterality: Left;  . A/V SHUNT INTERVENTION N/A 11/02/2017   Procedure: A/V SHUNT INTERVENTION;  Surgeon: Algernon Huxley, MD;  Location: [facility];  Service: Cardiovascular;  Laterality: N/A;  . ABDOMINAL HYSTERECTOMY  1990 ?  . AV FISTULA PLACEMENT  4/30  . BREAST EXCISIONAL BIOPSY Left 1994   neg surgical bx  . CHOLECYSTECTOMY  2003  . COLONOSCOPY  2014   Dr. Jamal Collin  . COLONOSCOPY WITH PROPOFOL N/A 09/24/2015   Procedure: COLONOSCOPY WITH random colon byopies.;  Surgeon: Lucilla Lame, MD;  Location: [facility];  Service:  Endoscopy;  Laterality: N/A;  . ESOPHAGOGASTRODUODENOSCOPY (EGD) WITH PROPOFOL N/A 09/24/2015   Procedure: ESOPHAGOGASTRODUODENOSCOPY (EGD) ;  Surgeon: Lucilla Lame, MD;  Location: [facility];  Service: Endoscopy;  Laterality: N/A;  . EYE SURGERY    . PERIPHERAL VASCULAR CATHETERIZATION N/A 05/29/2015   Procedure: A/V Shuntogram/Fistulagram;  Surgeon: Katha Cabal, MD;  Location: [facility];  Service: Cardiovascular;  Laterality: N/A;  . PERIPHERAL VASCULAR CATHETERIZATION N/A 05/29/2015   Procedure: A/V Shunt Intervention;  Surgeon: Katha Cabal, MD;  Location: [facility];  Service: Cardiovascular;  Laterality: N/A;  . POLYPECTOMY  09/24/2015   Procedure: POLYPECTOMY INTESTINAL;  Surgeon: Lucilla Lame, MD;  Location: [facility];  Service: Endoscopy;;  cecal polyp ascending polyp   Family History:  Family History  Problem Relation Age of Onset  . Stroke Father   . Hypertension Father   . Breast cancer Other   . Colon cancer Neg Hx   . Liver disease Neg Hx    Social History:  Social History   Substance and Sexual Activity  Alcohol Use No  . Alcohol/week: 0.0  oz     Social History   Substance and Sexual Activity  Drug Use No    Social History   Socioeconomic History  . Marital status: Single    Spouse name: Not on file  . Number of children: 1  . Years of education: Not on file  . Highest education level: Not on file  Occupational History  . Occupation: Surveyor, quantity: [employer]  Social Needs  . Financial resource strain: Not on file  . Food insecurity:    Worry: Not on file    Inability: Not on file  . Transportation needs:    Medical: Not on file    Non-medical: Not on file  Tobacco Use  . Smoking status: Never Smoker  . Smokeless tobacco: Never Used  Substance and Sexual Activity  . Alcohol use: No    Alcohol/week: 0.0 oz  . Drug use: No  . Sexual activity: Not Currently  Lifestyle  . Physical activity:     Days per week: Not on file    Minutes per session: Not on file  . Stress: Not on file  Relationships  . Social connections:    Talks on phone: Not on file    Gets together: Not on file    Attends religious service: Not on file    Active member of club or organization: Not on file    Attends meetings of clubs or organizations: Not on file    Relationship status: Not on file  Other Topics Concern  . 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    Hospital Course:    Ms. Gladue is a 72 year old female with a history of bipolar illness and ESRD admitted for agitation. The family reports frequent agitated behavior and believes that her bipolar is not under control.We increase Tegretol to 200 mg TID as her tegretol level was low at 3.7. She tolerated medication adjustment well. At the time of discharge, the patient is not suicidal or psychotic. UTI resolved with treatment with Keflex. She is discharged with her son.   #Mood/psychosis -continue Tegretol 200 mg TID  -continue Zyprexa 15 mg nightly -continue haldol 2.5 mg in am, 5 mg qhs  #Social -there is a new HCPOA scanned on chart -patient expressly refuses ECT  #Insomnia -Ativan 2 mg nightly  #UTI -course of Keflex 250 mg TID completed  #ESRD, on dialysis MWF -nephrology help is greatly appreciated -continue Midodrin 10 mg daily  #HTN -Metoprolol 25 mg BID  #Restless legs -continue Requip 2 mgat 15:00 daily  #GERD -continue Pepcid 20 mg daily and Bentyl 20 mg TID  #Metabolic syndrome monitoring -labs were obtained recently  #Disposition -discharge to home with family -follow up with Dr. Thurmond Butts on June 26 ay 4:30     Physical Findings: AIMS: Facial and Oral Movements Muscles of Facial Expression: None, normal Lips and Perioral Area: None, normal Jaw: None, normal Tongue: None, normal,Extremity Movements Upper  (arms, wrists, hands, fingers): None, normal Lower (legs, knees, ankles, toes): None, normal, Trunk Movements Neck, shoulders, hips: None, normal, Overall Severity Severity of abnormal movements (highest score from questions above): None, normal Incapacitation due to abnormal movements: None, normal Patient's awareness of abnormal movements (rate only patient's report): No Awareness, Dental Status Current problems with teeth and/or dentures?: No Does patient usually wear dentures?: No  CIWA:  CIWA-Ar Total: 2 COWS:  COWS Total Score: 2  Musculoskeletal: Strength & Muscle Tone: within normal limits Gait & Station: normal Patient leans: N/A  Psychiatric Specialty Exam: Physical Exam  Nursing note and vitals reviewed. Psychiatric: She has a normal mood and affect. Her speech is normal and behavior is normal. Judgment and thought content normal. Cognition and memory are normal.    Review of Systems  Neurological: Negative.   Psychiatric/Behavioral: Negative.   All other systems reviewed and are negative.   Blood pressure (!) 146/63, pulse 60, temperature 98.2 F (36.8 C), temperature source Oral, resp. rate 20, height '5\' 3"'$  (1.6 m), weight 72 kg (158 lb 11.7 oz), SpO2 98 %.Body mass index is 28.12 kg/m.  General Appearance: Casual  Eye Contact:  Good  Speech:  Clear and Coherent  Volume:  Normal  Mood:  Euthymic  Affect:  Appropriate  Thought Process:  Goal Directed and Descriptions of Associations: Intact  Orientation:  Full (Time, Place, and Person)  Thought Content:  WDL  Suicidal Thoughts:  No  Homicidal Thoughts:  No  Memory:  Immediate;   Fair Recent;   Fair Remote;   Fair  Judgement:  Impaired  Insight:  Shallow  Psychomotor Activity:  Normal  Concentration:  Concentration: Fair and Attention Span: Fair  Recall:  [employer] of Knowledge:  Fair  Language:  Fair  Akathisia:  No  Handed:  Right  AIMS (if indicated):     Assets:  Communication Skills Desire for  Improvement Financial Resources/Insurance Housing Resilience Social Support  ADL's:  Intact  Cognition:  WNL  Sleep:  Number of Hours: 7.25     Have you used any form of tobacco in the last 30 days? (Cigarettes, Smokeless Tobacco, Cigars, and/or Pipes): No  Has this patient used any form of tobacco in the last 30 days? (Cigarettes, Smokeless Tobacco, Cigars, and/or Pipes) Yes, No  Blood Alcohol level:  Lab Results  Component Value Date   ETH <10 12/14/2017   ETH <10 94/17/4081    Metabolic Disorder Labs:  Lab Results  Component Value Date   HGBA1C 4.8 09/18/2017   MPG 91.06 09/18/2017   No results found for: PROLACTIN Lab Results  Component Value Date   CHOL 251 (H) 09/18/2017   TRIG 124 09/18/2017   HDL 66 09/18/2017   CHOLHDL 3.8 09/18/2017   VLDL 25 09/18/2017   LDLCALC 160 (H) 09/18/2017   LDLCALC 43 05/14/2014    See Psychiatric Specialty Exam and Suicide Risk Assessment completed by Attending Physician prior to discharge.  Discharge destination:  Home  Is patient on multiple antipsychotic therapies at discharge:  Yes,   Do you recommend tapering to monotherapy for antipsychotics?  No   Has Patient had three or more failed trials of antipsychotic monotherapy by history:  Yes,   Antipsychotic medications that previously failed include:   1.  abilify., 2.  seroquel. and 3.  zyprexa.  Recommended Plan for Multiple Antipsychotic Therapies: Additional reason(s) for multiple antispychotic treatment:  inadequate response to a single agent  Discharge Instructions    Diet - low sodium heart healthy   Complete by:  As directed    Increase activity slowly   Complete by:  As directed      Allergies as of 12/21/2017      Reactions   Morphine And Related Shortness Of Breath   Pt reports chest pain and difficulty breathing.   Indomethacin Hives   Pollen Extract Other (See Comments)   Sinus problems and HA      Medication  List    STOP taking these medications    pantoprazole 40 MG tablet Commonly known as:  PROTONIX     TAKE these medications     Indication  acetaminophen 325 MG tablet Commonly known as:  TYLENOL Take 325 mg 2 (two) times daily as needed by mouth for moderate pain or headache.  Indication:  Pain   brimonidine 0.2 % ophthalmic solution Commonly known as:  ALPHAGAN Place 1 drop into both eyes 2 (two) times daily.  Indication:  Wide-Angle Glaucoma   carbamazepine 200 MG tablet Commonly known as:  TEGRETOL Take 1 tablet (200 mg total) by mouth 3 (three) times daily. What changed:  when to take this  Indication:  Manic-Depression   cholecalciferol 1000 units tablet Commonly known as:  VITAMIN D Take 1,000 Units by mouth daily.  Indication:  general health   cyanocobalamin 1000 MCG/ML injection Commonly known as:  (VITAMIN B-12) Inject 1,000 mcg into the muscle every 30 (thirty) days.  Indication:  Inadequate Vitamin B12   dicyclomine 20 MG tablet Commonly known as:  BENTYL Take 1 tablet (20 mg total) by mouth 3 (three) times daily before meals. What changed:  when to take this  Indication:  Irritable Bowel Syndrome   epoetin alfa 4000 UNIT/ML injection Commonly known as:  EPOGEN,PROCRIT Inject 1 mL (4,000 Units total) into the vein every Monday, Wednesday, and Friday with hemodialysis.  Indication:  Anemia due to Kidney Failure   famotidine 20 MG tablet Commonly known as:  PEPCID Take 1 tablet (20 mg total) by mouth daily.  Indication:  Gastroesophageal Reflux Disease   haloperidol 5 MG tablet Commonly known as:  HALDOL TAKE 1/2 TABLET BY MOUTH IN THE MORNING AND 1 TABLET AT BEDTIME  Indication:  Psychosis   lidocaine 5 % Commonly known as:  LIDODERM Place 1 patch onto the skin daily. Remove & Discard patch within 12 hours or as directed by MD  Indication:  pain   lidocaine-prilocaine cream Commonly known as:  EMLA Apply 1 application every Monday, Wednesday, and Friday topically. At dialysis   Indication:  Anesthesia to a Specific Part of the Body   LORazepam 2 MG tablet Commonly known as:  ATIVAN Take 1 tablet (2 mg total) by mouth at bedtime.  Indication:  Anxiousness associated with Depression, Trouble Sleeping due to Feeling Anxious   metoprolol tartrate 25 MG tablet Commonly known as:  LOPRESSOR Take 0.5 tablets (12.5 mg total) by mouth 2 (two) times daily. What changed:  how much to take  Indication:  High Blood Pressure Disorder   midodrine 10 MG tablet Commonly known as:  PROAMATINE Take 1 tablet (10 mg total) by mouth daily.  Indication:  Acute Renal Failure in Patients with Severe Liver Disease   multivitamin Tabs tablet TAKE 1 TABLET BY MOUTH EVERYDAY AT BEDTIME  Indication:  dialysis   OLANZapine 15 MG tablet Commonly known as:  ZYPREXA Take 1 tablet (15 mg total) by mouth at bedtime.  Indication:  Manic-Depression   rOPINIRole 2 MG tablet Commonly known as:  REQUIP Take 1 tablet (2 mg total) by mouth at bedtime. What changed:  when to take this  Indication:  Restless Leg Syndrome   timolol 0.5 % ophthalmic solution Commonly known as:  TIMOPTIC Place 1 drop into both eyes 2 (two) times daily.  Indication:  Wide-Angle Glaucoma      Follow-up Information    Lew Dawes, MD Follow up on 01/13/2018.   Specialty:  Psychiatry Why:  Your  follow-up appointment is scheduled for 01/13/18 at 4:30 PM. Contact information: [address] [place] [address] [phone]           Follow-up recommendations:  Activity:  as tolerated Diet:  renal Other:  keep follow up appointments  Comments:    Signed: Orson Slick, MD 12/21/2017, 11:34 AM

## 2017-12-21 NOTE — Progress Notes (Signed)
[facility]  ROUNDING NOTE   Subjective:   Patient doing well. No complaints. Dialysis for later today.   Objective:  Vital signs in last 24 hours:  Temp:  [98.2 F (36.8 C)] 98.2 F (36.8 C) (06/02 1618) Pulse Rate:  [60-79] 79 (06/03 1033) BP: (137-146)/(58-63) 137/58 (06/03 1033) SpO2:  [98 %] 98 % (06/02 1618)  Weight change:  Filed Weights   12/15/17 2223 12/16/17 1234  Weight: 71.7 kg (158 lb) 72 kg (158 lb 11.7 oz)    Intake/Output: I/O last 3 completed shifts: In: 600 [P.O.:600] Out: -    Intake/Output this shift:  Total I/O In: 240 [P.O.:240] Out: -   Physical Exam: General: No acute distress  Head: Normocephalic, atraumatic. Moist oral mucosal membranes  Eyes: Anicteric  Neck: Supple, trachea midline  Lungs:  Clear to auscultation, normal effort  Heart: S1S2 no rubs  Abdomen:  Soft, nontender, bowel sounds present  Extremities: No peripheral edema.  Neurologic: Awake, alert, following commands  Skin: No lesions  Access: LUE AVF    Basic Metabolic Panel: Recent Labs  Lab 12/14/17 2240 12/16/17 1341 12/16/17 1719 12/18/17 1235  NA 136 138 140  --   K 3.3* 3.2* 3.1*  --   CL 98* 98* 96*  --   CO2 28 27 32  --   GLUCOSE 119* 129* 116*  --   BUN 10 21* 7  --   CREATININE 3.03* 4.92* 2.27*  --   CALCIUM 8.8* 9.0 9.2  --   PHOS  --  4.3 2.3* 5.2*    Liver Function Tests: Recent Labs  Lab 12/14/17 2240 12/16/17 1341 12/16/17 1719  AST 24  --   --   ALT 12*  --   --   ALKPHOS 68  --   --   BILITOT 0.4  --   --   PROT 6.7  --   --   ALBUMIN 3.8 3.9 4.4   No results for input(s): LIPASE, AMYLASE in the last 168 hours. No results for input(s): AMMONIA in the last 168 hours.  CBC: Recent Labs  Lab 12/14/17 2240 12/16/17 1341 12/16/17 1719  WBC 7.9 7.3 6.6  NEUTROABS 6.2  --   --   HGB 11.8* 11.2* 13.0  HCT 34.8* 33.4* 38.0  MCV 101.8* 103.0* 103.0*  PLT 252 208 234    Cardiac Enzymes: No results for input(s):  CKTOTAL, CKMB, CKMBINDEX, TROPONINI in the last 168 hours.  BNP: Invalid input(s): POCBNP  CBG: Recent Labs  Lab 12/16/17 2329  GLUCAP 115*    Microbiology: Results for orders placed or performed during the hospital encounter of 09/01/17  Urine culture     Status: Abnormal   Collection Time: 09/01/17  4:32 PM  Result Value Ref Range Status   Specimen Description   Final    URINE, RANDOM Performed at [facility], [address] [place], [place] [address]    Special Requests   Final    NONE Performed at [facility], [address] [place], [place] [address]    Culture MULTIPLE SPECIES PRESENT, SUGGEST RECOLLECTION (A)  Final   Report Status 09/03/2017 FINAL  Final  Culture, blood (routine x 2)     Status: None   Collection Time: 09/01/17  6:05 PM  Result Value Ref Range Status   Specimen Description BLOOD RIGHT HAND  Final   Special Requests   Final    BOTTLES DRAWN AEROBIC AND ANAEROBIC Blood Culture results may  not be optimal due to an excessive volume of blood received in culture bottles   Culture   Final    NO GROWTH 5 DAYS Performed at [facility], [address] [place], [place] [address]    Report Status 09/06/2017 FINAL  Final  Culture, blood (routine x 2)     Status: None   Collection Time: 09/01/17  6:34 PM  Result Value Ref Range Status   Specimen Description BLOOD RAC  Final   Special Requests   Final    BOTTLES DRAWN AEROBIC AND ANAEROBIC Blood Culture adequate volume   Culture   Final    NO GROWTH 5 DAYS Performed at [facility], [address], [place], [place] [address]    Report Status 09/06/2017 FINAL  Final    Coagulation Studies: No results for input(s): LABPROT, INR in the last 72 hours.  Urinalysis: No results for input(s): COLORURINE, LABSPEC, PHURINE, GLUCOSEU, HGBUR, BILIRUBINUR, KETONESUR, PROTEINUR, UROBILINOGEN, NITRITE, LEUKOCYTESUR in the last 72 hours.  Invalid input(s): APPERANCEUR     Imaging: No results found.   Medications:   . sodium chloride    . sodium chloride    . sodium chloride    . sodium chloride     . brimonidine  1 drop Both Eyes BID  . carbamazepine  200 mg Oral TID  . cephALEXin  250 mg Oral Q8H  . dicyclomine  20 mg Oral TID AC  . famotidine  20 mg Oral Daily  . haloperidol  2 mg Oral BH-q7a  . haloperidol  5 mg Oral QHS  . LORazepam  2 mg Oral QHS  . metoprolol tartrate  25 mg Oral BID  . midodrine  10 mg Oral Daily  . OLANZapine  15 mg Oral QHS  . rOPINIRole  2 mg Oral Daily  . timolol  1 drop Both Eyes BID   sodium chloride, sodium chloride, sodium chloride, sodium chloride, acetaminophen, alteplase, alteplase, alum & mag hydroxide-simeth, heparin, heparin, lidocaine (PF), lidocaine (PF), lidocaine-prilocaine, magnesium hydroxide, pentafluoroprop-tetrafluoroeth  Assessment/ Plan:  72 y.o. female with end stage renal disease on hemodialysis, diverticulosis, hypotension, bipolar disorder, polycystic kidney disease   [facility]/MWF  1.  ESRD on HD Dialysis for later today.   2.  Anemia chronic kidney disease.  Hemoglobin 13. Holding EPO  3.  Secondary hyperparathyroidism: phosphorus at goal. Not currently on binders.   4.  Hypotension: blood pressure 137/58 - Continue midodrine    LOS: 6 Natalie Mceuen 6/3/201912:54 PM

## 2017-12-21 NOTE — Progress Notes (Signed)
HD tx end   12/21/17 1754  Vital Signs  Pulse Rate 77  Pulse Rate Source Monitor  Resp 20  BP (!) 140/51  BP Location Right Arm  BP Method Automatic  Patient Position (if appropriate) Sitting  Oxygen Therapy  SpO2 98 %  O2 Device Room Air  During Hemodialysis Assessment  Dialysis Fluid Bolus Normal Saline  Bolus Amount (mL) 250 mL  Intra-Hemodialysis Comments Tx completed

## 2017-12-21 NOTE — Progress Notes (Signed)
Patient did not wake up for breakfast or morning meds today.Patient was sleeping after lunch also.Patient denies SI,HI and AVH.States "I am doing good."Compliant with medications.Appetite according to patient is "I am eating."support and encouragement given.

## 2017-12-21 NOTE — Plan of Care (Addendum)
Patient found in day room upon my arrival. Patient is visible but not social this evening. Mood and affect remain consistent. Patient appears tired after dialysis today. Denies SI/HI/AVH. Denies pain. Reports eating and voiding adequately. Compliant with HS medications and staff direction. Q 15 minute checks maintained. Will continue to monitor throughout the shift. Patient up most of the night claiming that she cannot sleep due to ravenous hunger. "I didn't get breakfast or dinner. I'm famished." Patient given food several times but continues to be hungry. Patient slept only 4.25 hours. Will endorse care to oncoming shift.  Problem: Coping: Goal: Ability to use eye contact when communicating with others will improve Outcome: Progressing   Problem: Education: Goal: Knowledge of the prescribed therapeutic regimen will improve Outcome: Progressing   Problem: Health Behavior/Discharge Planning: Goal: Compliance with therapeutic regimen will improve Outcome: Progressing   Problem: Nutrition: Goal: Adequate nutrition will be maintained Outcome: Progressing   Problem: Pain Managment: Goal: General experience of comfort will improve Outcome: Progressing   Problem: Coping: Goal: Ability to identify and develop effective coping behavior will improve Outcome: Not Progressing Goal: Ability to interact with others will improve Outcome: Not Progressing Goal: Demonstration of participation in decision-making regarding own care will improve Outcome: Not Progressing

## 2017-12-21 NOTE — Progress Notes (Signed)
Post HD assessment. Pt tolerated tx well without c/o or complication. Net UF 1519, goal met.    12/21/17 1802  Vital Signs  Temp 97.6 F (36.4 C)  Temp Source Oral  Pulse Rate 74  Pulse Rate Source Monitor  Resp 17  BP (!) 141/56  BP Location Right Arm  BP Method Automatic  Patient Position (if appropriate) Lying  Oxygen Therapy  SpO2 99 %  O2 Device Room Air  Post-Hemodialysis Assessment  Rinseback Volume (mL) 250 mL  KECN 70.1 V  Dialyzer Clearance Lightly streaked  Duration of HD Treatment -hour(s) 3.5 hour(s)  Hemodialysis Intake (mL) 500 mL  UF Total -Machine (mL) 2019 mL  Net UF (mL) 1519 mL  Tolerated HD Treatment Yes  AVG/AVF Arterial Site Held (minutes) 10 minutes  AVG/AVF Venous Site Held (minutes) 10 minutes  Education / Care Plan  Dialysis Education Provided Yes  Documented Education in Care Plan Yes

## 2017-12-21 NOTE — Progress Notes (Signed)
Pre HD assessment    12/21/17 1405  Vital Signs  Temp 97.6 F (36.4 C)  Temp Source Oral  Pulse Rate 60  Pulse Rate Source Monitor  Resp (!) 21  BP 116/78  BP Location Right Arm  BP Method Automatic  Patient Position (if appropriate) Lying  Oxygen Therapy  SpO2 98 %  O2 Device Room Air  Pain Assessment  Pain Scale 0-10  Pain Score 0  Dialysis Weight  Weight  (unable to weigh pt in chair, no scale available )  Type of Weight Pre-Dialysis  Time-Out for Hemodialysis  What Procedure? HD  Pt Identifiers(min of two) First/Last Name;MRN/Account#  Correct Site? Yes  Correct Side? Yes  Correct Procedure? Yes  Consents Verified? Yes  Rad Studies Available? N/A  Safety Precautions Reviewed? Yes  Engineer, civil (consulting) Number  ([id number])  Station Number 3  UF/Alarm Test Passed  Conductivity: Meter 14  Conductivity: Machine  14.2  pH 7.6  Reverse Osmosis main  Normal Saline Lot Number [id number]  Dialyzer Lot Number [id number]  Disposable Set Lot Number [id number]  Machine Temperature 98.6 F (37 C)  Musician and Audible Yes  Blood Lines Intact and Secured Yes  Pre Treatment Patient Checks  Vascular access used during treatment Fistula  Hepatitis B Surface Antigen Results Negative  Date Hepatitis B Surface Antigen Drawn 12/16/17  Hepatitis B Surface Antibody  (<10)  Date Hepatitis B Surface Antibody Drawn 09/18/17  Hemodialysis Consent Verified Yes  Hemodialysis Standing Orders Initiated Yes  ECG (Telemetry) Monitor On Yes  Prime Ordered Normal Saline  Length of  DialysisTreatment -hour(s) 3.5 Hour(s)  Dialyzer Elisio 17H NR  Dialysate 2K, 2.5 Ca  Dialysis Anticoagulant None  Dialysate Flow Ordered 800  Blood Flow Rate Ordered 400 mL/min  Ultrafiltration Goal 1.5 Liters  Pre Treatment Labs Phosphorus  Dialysis Blood Pressure Support Ordered Normal Saline  Education / Care Plan  Dialysis Education Provided Yes  Documented Education in Care Plan Yes

## 2017-12-21 NOTE — Progress Notes (Signed)
Pre HD assessment    12/21/17 1406  Neurological  Level of Consciousness Alert  Orientation Level Oriented to person;Oriented to place;Oriented to situation  Respiratory  Respiratory Pattern Regular;Unlabored  Chest Assessment Chest expansion symmetrical  Cardiac  ECG Monitor Yes  Vascular  R Radial Pulse +2  L Radial Pulse +2  Integumentary  Integumentary (WDL) X  Skin Color Appropriate for ethnicity  Musculoskeletal  Musculoskeletal (WDL) X  Generalized Weakness Yes  Assistive Device None  GU Assessment  Genitourinary (WDL) X  Genitourinary Symptoms  (HD)  Psychosocial  Psychosocial (WDL) WDL  Patient Behaviors Calm;Cooperative;Appropriate for situation;Appropriate for age

## 2017-12-21 NOTE — Progress Notes (Signed)
[facility] MD Progress Note  12/21/2017 9:57 AM Joyce [name]  MRN:  [id number]   Subjective:    Joyce [name] has no complaints today. She denies any symptoms of depression, anxiety or psychosis. Sleep and appetite are fine. There are no behavioral problems. She accepts medications and tolerates them well. She will complete dialysis today. She will be discharged with family tomorrow after family meeting.   Principal Problem: Bipolar I disorder, most recent episode (or current) manic ([place]) Diagnosis:   Patient Active Problem List   Diagnosis Date Noted  . Bipolar affective disorder, current episode depressed with psychotic symptoms ([place]) [F31.2] 09/17/2017    Priority: High  . Bipolar I disorder, most recent episode (or current) manic ([place]) [F31.10] 09/02/2017    Priority: High  . UTI (urinary tract infection) [N39.0] 12/16/2017  . Bipolar 1 disorder, depressed ([place]) [F31.9] 12/15/2017  . Palliative care encounter [Z51.5]   . Acute delirium [R41.0] 09/02/2017  . Altered mental status [R41.82]   . Acute encephalopathy [G93.40] 09/01/2017  . ESRD on dialysis ([place]) [N18.6, Z99.2] 04/06/2017  . Complication of vascular access for dialysis [T82.9XXA] 04/06/2017  . Hematuria [R31.9] 12/18/2016  . Polycystic kidney [Q61.3] 10/12/2016  . Ruptured cyst of kidney [Q61.00] 10/03/2016  . HTN (hypertension) [I10] 10/01/2016  . Chronic kidney disease [N18.9] 10/01/2016  . Anemia associated with chronic renal failure [N18.9, D63.1] 09/24/2016  . Acute kidney insufficiency [N28.9] 07/28/2016  . Noninfectious diarrhea [K52.9]   . Benign neoplasm of cecum [D12.0]   . Benign neoplasm of ascending colon [D12.2]   . Diarrhea [R19.7]   . Nausea [R11.0]   . Gastric polyp [K31.7]   . Chronic constipation [K59.09] 12/27/2014  . Nausea with vomiting [R11.2] 12/27/2014  . Left sided abdominal pain [R10.9] 12/27/2014  . Personal history of colonic polyps [Z86.010] 05/10/2013   Total Time spent with patient: 15  minutes  Past Psychiatric History: bipolar  Past Medical History:  Past Medical History:  Diagnosis Date  . Anal fissure   . Bipolar affective disorder ([place])   . CKD (chronic kidney disease)    Dr Holley Raring [name] 4  . Colon polyps   . Diverticulitis   . Diverticulitis   . Family history of adverse reaction to anesthesia    mom - PONV  . GERD (gastroesophageal reflux disease)   . Headache    migraines - none over 10 yrs  . Heart murmur   . History of hiatal hernia   . Hypertension   . Pancreatitis    Valproic acid  . Vertigo     Past Surgical History:  Procedure Laterality Date  . A/V FISTULAGRAM Left 04/14/2017   Procedure: A/V Fistulagram;  Surgeon: Katha Cabal, MD;  Location: [facility];  Service: Cardiovascular;  Laterality: Left;  . A/V FISTULAGRAM Left 06/09/2017   Procedure: A/V FISTULAGRAM;  Surgeon: Katha Cabal, MD;  Location: [facility];  Service: Cardiovascular;  Laterality: Left;  . A/V FISTULAGRAM Left 11/02/2017   Procedure: A/V FISTULAGRAM;  Surgeon: Algernon Huxley, MD;  Location: [place] [facility];  Service: Cardiovascular;  Laterality: Left;  . A/V SHUNT INTERVENTION N/A 11/02/2017   Procedure: A/V SHUNT INTERVENTION;  Surgeon: Algernon Huxley, MD;  Location: [facility];  Service: Cardiovascular;  Laterality: N/A;  . ABDOMINAL HYSTERECTOMY  1990 ?  . AV FISTULA PLACEMENT  4/30  . BREAST EXCISIONAL BIOPSY Left 1994   neg surgical bx  . CHOLECYSTECTOMY  2003  .  COLONOSCOPY  2014   Dr. Jamal Collin  . COLONOSCOPY WITH PROPOFOL N/A 09/24/2015   Procedure: COLONOSCOPY WITH random colon byopies.;  Surgeon: Lucilla Lame, MD;  Location: [facility];  Service: Endoscopy;  Laterality: N/A;  . ESOPHAGOGASTRODUODENOSCOPY (EGD) WITH PROPOFOL N/A 09/24/2015   Procedure: ESOPHAGOGASTRODUODENOSCOPY (EGD) ;  Surgeon: Lucilla Lame, MD;  Location: [facility];  Service: Endoscopy;  Laterality: N/A;  . EYE SURGERY    . PERIPHERAL  VASCULAR CATHETERIZATION N/A 05/29/2015   Procedure: A/V Shuntogram/Fistulagram;  Surgeon: Katha Cabal, MD;  Location: [facility];  Service: Cardiovascular;  Laterality: N/A;  . PERIPHERAL VASCULAR CATHETERIZATION N/A 05/29/2015   Procedure: A/V Shunt Intervention;  Surgeon: Katha Cabal, MD;  Location: [place] [facility];  Service: Cardiovascular;  Laterality: N/A;  . POLYPECTOMY  09/24/2015   Procedure: POLYPECTOMY INTESTINAL;  Surgeon: Lucilla Lame, MD;  Location: [facility];  Service: Endoscopy;;  cecal polyp ascending polyp   Family History:  Family History  Problem Relation Age of Onset  . Stroke Father   . Hypertension Father   . Breast cancer Other   . Colon cancer Neg Hx   . Liver disease Neg Hx    Family Psychiatric  History: none Social History:  Social History   Substance and Sexual Activity  Alcohol Use No  . Alcohol/week: 0.0 oz     Social History   Substance and Sexual Activity  Drug Use No    Social History   Socioeconomic History  . Marital status: Single    Spouse name: Not on file  . Number of children: 1  . Years of education: Not on file  . Highest education level: Not on file  Occupational History  . Occupation: Surveyor, quantity: [employer]  Social Needs  . Financial resource strain: Not on file  . Food insecurity:    Worry: Not on file    Inability: Not on file  . Transportation needs:    Medical: Not on file    Non-medical: Not on file  Tobacco Use  . Smoking status: Never Smoker  . Smokeless tobacco: Never Used  Substance and Sexual Activity  . Alcohol use: No    Alcohol/week: 0.0 oz  . Drug use: No  . Sexual activity: Not Currently  Lifestyle  . Physical activity:    Days per week: Not on file    Minutes per session: Not on file  . Stress: Not on file  Relationships  . Social connections:    Talks on phone: Not on file    Gets together: Not on file    Attends religious service: Not on file     Active member of club or organization: Not on file    Attends meetings of clubs or organizations: Not on file    Relationship status: Not on file  Other Topics Concern  . 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   Additional Social History:                         Sleep: Fair  Appetite:  Fair  Current Medications: Current Facility-Administered Medications  Medication Dose Route Frequency Provider Last Rate Last Dose  . 0.9 %  sodium chloride infusion  100 mL Intravenous PRN Lateef, Munsoor, MD      . 0.9 %  sodium chloride infusion  100 mL Intravenous PRN Lateef, Munsoor, MD      . 0.9 %  sodium chloride infusion  100 mL Intravenous PRN Lateef, Munsoor, MD      . 0.9 %  sodium chloride infusion  100 mL Intravenous PRN Lateef, Munsoor, MD      . acetaminophen (TYLENOL) tablet 325 mg  325 mg Oral Q6H PRN Joniece Smotherman B, MD   325 mg at 12/19/17 0129  . alteplase (CATHFLO ACTIVASE) injection 2 mg  2 mg Intracatheter Once PRN Lateef, Munsoor, MD      . alteplase (CATHFLO ACTIVASE) injection 2 mg  2 mg Intracatheter Once PRN Lateef, Munsoor, MD      . alum & mag hydroxide-simeth (MAALOX/MYLANTA) [phone] MG/5ML suspension 30 mL  30 mL Oral Q4H PRN Clapacs, [name], MD      . brimonidine (ALPHAGAN) 0.2 % ophthalmic solution 1 drop  1 drop Both Eyes BID Okema Rollinson B, MD   1 drop at 12/20/17 1652  . carbamazepine (TEGRETOL) tablet 200 mg  200 mg Oral TID Lainey Nelson B, MD   200 mg at 12/20/17 1648  . cephALEXin (KEFLEX) capsule 250 mg  250 mg Oral Q8H Daisey Caloca B, MD   Stopped at 12/21/17 0600  . dicyclomine (BENTYL) tablet 20 mg  20 mg Oral TID AC Clapacs, [name], MD   20 mg at 12/20/17 1648  . famotidine (PEPCID) tablet 20 mg  20 mg Oral Daily Clapacs, [name], MD   20 mg at 12/20/17 0929  . haloperidol (HALDOL) tablet 2 mg  2 mg Oral Loney Loh, Chailyn Racette  B, MD   Stopped at 12/21/17 [phone]  . haloperidol (HALDOL) tablet 5 mg  5 mg Oral QHS Aubre Quincy B, MD   5 mg at 12/20/17 2115  . heparin injection 1,000 Units  1,000 Units Dialysis PRN Lateef, Munsoor, MD      . heparin injection 1,000 Units  1,000 Units Dialysis PRN Lateef, Munsoor, MD      . lidocaine (PF) (XYLOCAINE) 1 % injection 5 mL  5 mL Intradermal PRN Lateef, Munsoor, MD      . lidocaine (PF) (XYLOCAINE) 1 % injection 5 mL  5 mL Intradermal PRN Lateef, Munsoor, MD      . lidocaine-prilocaine (EMLA) cream 1 application  1 application Topical PRN Lateef, Munsoor, MD      . LORazepam (ATIVAN) tablet 2 mg  2 mg Oral QHS Earleen Aoun B, MD   2 mg at 12/20/17 2115  . magnesium hydroxide (MILK OF MAGNESIA) suspension 30 mL  30 mL Oral Daily PRN Clapacs, [name], MD      . metoprolol tartrate (LOPRESSOR) tablet 25 mg  25 mg Oral BID Clapacs, [name], MD   25 mg at 12/20/17 1648  . midodrine (PROAMATINE) tablet 10 mg  10 mg Oral Daily Clapacs, [name], MD   10 mg at 12/20/17 0937  . OLANZapine (ZYPREXA) tablet 15 mg  15 mg Oral QHS Clapacs, [name], MD   15 mg at 12/20/17 2115  . pentafluoroprop-tetrafluoroeth (GEBAUERS) aerosol 1 application  1 application Topical PRN Lateef, Munsoor, MD      . rOPINIRole (REQUIP) tablet 2 mg  2 mg Oral Daily Jazmyne Beauchesne B, MD   2 mg at 12/20/17 1648  . timolol (TIMOPTIC) 0.5 % ophthalmic solution 1 drop  1 drop Both Eyes BID Clapacs, [name], MD   1 drop at 12/20/17 1651    Lab Results: No results  found for this or any previous visit (from the past 48 hour(s)).  Blood Alcohol level:  Lab Results  Component Value Date   ETH <10 12/14/2017   ETH <10 29/79/8921    Metabolic Disorder Labs: Lab Results  Component Value Date   HGBA1C 4.8 09/18/2017   MPG 91.06 09/18/2017   No results found for: PROLACTIN Lab Results  Component Value Date   CHOL 251 (H) 09/18/2017   TRIG 124 09/18/2017   HDL 66 09/18/2017   CHOLHDL 3.8 09/18/2017    VLDL 25 09/18/2017   LDLCALC 160 (H) 09/18/2017   LDLCALC 43 05/14/2014    Physical Findings: AIMS: Facial and Oral Movements Muscles of Facial Expression: None, normal Lips and Perioral Area: None, normal Jaw: None, normal Tongue: None, normal,Extremity Movements Upper (arms, wrists, hands, fingers): None, normal Lower (legs, knees, ankles, toes): None, normal, Trunk Movements Neck, shoulders, hips: None, normal, Overall Severity Severity of abnormal movements (highest score from questions above): None, normal Incapacitation due to abnormal movements: None, normal Patient's awareness of abnormal movements (rate only patient's report): No Awareness, Dental Status Current problems with teeth and/or dentures?: No Does patient usually wear dentures?: No  CIWA:  CIWA-Ar Total: 2 COWS:  COWS Total Score: 2  Musculoskeletal: Strength & Muscle Tone: within normal limits Gait & Station: normal Patient leans: N/A  Psychiatric Specialty Exam: Physical Exam  Nursing note and vitals reviewed. Psychiatric: She has a normal mood and affect. Her speech is normal and behavior is normal. Thought content normal. Cognition and memory are normal. She expresses impulsivity.    Review of Systems  Neurological: Negative.   Psychiatric/Behavioral: Negative.   All other systems reviewed and are negative.   Blood pressure (!) 146/63, pulse 60, temperature 98.2 F (36.8 C), temperature source Oral, resp. rate 20, height 5\' 3"  (1.6 m), weight 72 kg (158 lb 11.7 oz), SpO2 98 %.Body mass index is 28.12 kg/m.  General Appearance: Casual  Eye Contact:  Good  Speech:  Clear and Coherent  Volume:  Normal  Mood:  Euthymic  Affect:  Appropriate  Thought Process:  Goal Directed and Descriptions of Associations: Intact  Orientation:  Full (Time, Place, and Person)  Thought Content:  WDL  Suicidal Thoughts:  No  Homicidal Thoughts:  No  Memory:  Immediate;   Fair Recent;   Fair Remote;   Fair   Judgement:  Impaired  Insight:  Shallow  Psychomotor Activity:  Normal  Concentration:  Concentration: Fair and Attention Span: Fair  Recall:  [employer] of Knowledge:  Fair  Language:  Fair  Akathisia:  No  Handed:  Right  AIMS (if indicated):     Assets:  Communication Skills Desire for Improvement Financial Resources/Insurance Housing Resilience Social Support  ADL's:  Intact  Cognition:  WNL  Sleep:  Number of Hours: 7.25     Treatment Plan Summary: Daily contact with patient to assess and evaluate symptoms and progress in treatment and Medication management   Joyce [name] is a 72 year old female with a history of bipolar illness and ESRD admitted for agitation. The family reports frequent agitated behavior and believes that her bipolar is not under control.  #Mood/psychosis -continue Tegretol at a higher dose 200 mg TID, level 3.7 -continue Zyprexa 15 mg nightly -continue haldol 2 mg in am, 5 mg qhs  #Social -there is a new HCPOA scanned on chart -her son is designated -patient expressly refuses ECT  #Insomnia -Ativan 2 mg nightly  #UTI -continue Keflex 250  mg TID  #ESRD, on dialysis MWF -nephrology help is greatly appreciated -continue Midodrin 10 mg daily  #HTN -Metoprolol 25 mg BID  #Restless legs -continue Requip 2 mgat 15:00 daily  #GERD -continue Pepcid 20 mg daily and Bentyl 20 mg TID  #Metabolic syndrome monitoring -labs were obtained recently  #Disposition -discharge to home with family -follow up with Dr. Thurmond Butts on June 26 ay 4:30      Orson Slick, MD 12/21/2017, 9:57 AM

## 2017-12-21 NOTE — Progress Notes (Signed)
HD tx start    12/21/17 1415  Vital Signs  Pulse Rate 60  Pulse Rate Source Monitor  Resp 17  BP (!) 118/53  BP Location Right Arm  BP Method Automatic  Patient Position (if appropriate) Lying  Oxygen Therapy  SpO2 98 %  O2 Device Room Air  During Hemodialysis Assessment  Blood Flow Rate (mL/min) 400 mL/min  Arterial Pressure (mmHg) -220 mmHg  Venous Pressure (mmHg) 180 mmHg  Transmembrane Pressure (mmHg) 60 mmHg  Ultrafiltration Rate (mL/min) 600 mL/min  Dialysate Flow Rate (mL/min) 800 ml/min  Conductivity: Machine  14.2  HD Safety Checks Performed Yes  Dialysis Fluid Bolus Normal Saline  Bolus Amount (mL) 250 mL  Intra-Hemodialysis Comments Tx initiated

## 2017-12-21 NOTE — BHH Suicide Risk Assessment (Signed)
[facility] Discharge Suicide Risk Assessment   Principal Problem: Bipolar I disorder, most recent episode (or current) manic [facility]) Discharge Diagnoses:  Patient Active Problem List   Diagnosis Date Noted  . Bipolar affective disorder, current episode depressed with psychotic symptoms ([place]) [F31.2] 09/17/2017    Priority: High  . Bipolar I disorder, most recent episode (or current) manic ([place]) [F31.10] 09/02/2017    Priority: High  . UTI (urinary tract infection) [N39.0] 12/16/2017  . Bipolar 1 disorder, depressed ([place]) [F31.9] 12/15/2017  . Palliative care encounter [Z51.5]   . Acute delirium [R41.0] 09/02/2017  . Altered mental status [R41.82]   . Acute encephalopathy [G93.40] 09/01/2017  . ESRD on dialysis ([place]) [N18.6, Z99.2] 04/06/2017  . Complication of vascular access for dialysis [T82.9XXA] 04/06/2017  . Hematuria [R31.9] 12/18/2016  . Polycystic kidney [Q61.3] 10/12/2016  . Ruptured cyst of kidney [Q61.00] 10/03/2016  . HTN (hypertension) [I10] 10/01/2016  . Chronic kidney disease [N18.9] 10/01/2016  . Anemia associated with chronic renal failure [N18.9, D63.1] 09/24/2016  . Acute kidney insufficiency [N28.9] 07/28/2016  . Noninfectious diarrhea [K52.9]   . Benign neoplasm of cecum [D12.0]   . Benign neoplasm of ascending colon [D12.2]   . Diarrhea [R19.7]   . Nausea [R11.0]   . Gastric polyp [K31.7]   . Chronic constipation [K59.09] 12/27/2014  . Nausea with vomiting [R11.2] 12/27/2014  . Left sided abdominal pain [R10.9] 12/27/2014  . Personal history of colonic polyps [Z86.010] 05/10/2013    Total Time spent with patient: 20 minutes  Musculoskeletal: Strength & Muscle Tone: within normal limits Gait & Station: normal Patient leans: N/A  Psychiatric Specialty Exam: Review of Systems  Neurological: Negative.   Psychiatric/Behavioral: Negative.   All other systems reviewed and are negative.   Blood pressure (!) 116/50, pulse (!) 59, temperature 98.1 F (36.7 C),  temperature source Oral, resp. rate 18, height 5\' 3"  (1.6 m), weight 72 kg (158 lb 11.7 oz), SpO2 97 %.Body mass index is 28.12 kg/m.  General Appearance: Casual  Eye Contact::  Good  Speech:  Clear and Coherent409  Volume:  Normal  Mood:  Euthymic  Affect:  Appropriate  Thought Process:  Goal Directed and Descriptions of Associations: Intact  Orientation:  Full (Time, Place, and Person)  Thought Content:  WDL  Suicidal Thoughts:  No  Homicidal Thoughts:  No  Memory:  Immediate;   Fair Recent;   Fair Remote;   Fair  Judgement:  Impaired  Insight:  Shallow  Psychomotor Activity:  Normal  Concentration:  Fair  Recall:  [place]  Language: Fair  Akathisia:  No  Handed:  Right  AIMS (if indicated):     Assets:  Communication Skills Desire for Improvement Financial Resources/Insurance Housing Resilience Social Support  Sleep:  Number of Hours: 7.17  Cognition: WNL  ADL's:  Intact   Mental Status Per Nursing Assessment::   On Admission:  NA  Demographic Factors:  Divorced or widowed and Caucasian  Loss Factors: Decrease in vocational status and Decline in physical health  Historical Factors: Prior suicide attempts and Impulsivity  Risk Reduction Factors:   Sense of responsibility to family, Living with another person, especially a relative, Positive social support and Positive therapeutic relationship  Continued Clinical Symptoms:  Bipolar Disorder:   Mixed State Previous Psychiatric Diagnoses and Treatments Medical Diagnoses and Treatments/Surgeries  Cognitive Features That Contribute To Risk:  None    Suicide Risk:  Minimal: No identifiable suicidal ideation.  Patients presenting with no risk  factors but with morbid ruminations; may be classified as minimal risk based on the severity of the depressive symptoms  Follow-up Information    Lew Dawes, MD Follow up on 01/13/2018.   Specialty:  Psychiatry Why:  Your follow-up appointment  is scheduled for 01/13/18 at 4:30 PM. Contact information: [address] [place] [address] [phone]           Plan Of Care/Follow-up recommendations:  Activity:  as tolerated Diet:  renal Other:  keep follow up appointments  Orson Slick, MD 12/23/2017, 8:23 AM

## 2017-12-21 NOTE — BHH Group Notes (Signed)
[facility] Group Notes:  (Nursing/MHT/Case Management/Adjunct)  Date:  12/21/2017  Time:  8:52 PM  Type of Therapy:  Group Therapy  Participation Level:  Active  Participation Quality:  Appropriate  Affect:  Appropriate  Cognitive:  Alert  Insight:  Good  Engagement in Group:  Engaged  Modes of Intervention:  Support  Summary of Progress/Problems:  [name] 12/21/2017, 8:52 PM

## 2017-12-21 NOTE — Progress Notes (Signed)
Post HD assessment    12/21/17 1801  Neurological  Level of Consciousness Alert  Orientation Level Oriented to person;Oriented to place;Oriented to situation  Respiratory  Respiratory Pattern Regular;Unlabored  Chest Assessment Chest expansion symmetrical  Cardiac  ECG Monitor Yes  Vascular  R Radial Pulse +2  L Radial Pulse +2  Integumentary  Integumentary (WDL) X  Skin Color Appropriate for ethnicity  Musculoskeletal  Musculoskeletal (WDL) X  Generalized Weakness Yes  Assistive Device None  GU Assessment  Genitourinary (WDL) X  Genitourinary Symptoms  (HD)  Psychosocial  Psychosocial (WDL) WDL  Patient Behaviors Calm;Cooperative;Appropriate for situation

## 2017-12-21 NOTE — Progress Notes (Signed)
Recreation Therapy Notes  Date: 12/21/2017  Time: 9:30 am   Location: Craft Room   Behavioral response: N/A   Intervention Topic:  Happiness  Discussion/Intervention: Patient did not attend group.   Clinical Observations/Feedback:  Patient did not attend group.   Favian Kittleson LRT/CTRS         Roark Rufo 12/21/2017 11:34 AM

## 2017-12-22 LAB — CARBAMAZEPINE LEVEL, TOTAL: CARBAMAZEPINE LVL: 8.5 ug/mL (ref 4.0–12.0)

## 2017-12-22 LAB — PHOSPHORUS: PHOSPHORUS: 3.5 mg/dL (ref 2.5–4.6)

## 2017-12-22 MED ORDER — CHLORHEXIDINE GLUCONATE CLOTH 2 % EX PADS: 6.0000 | MEDICATED_PAD | Freq: Every day | CUTANEOUS | Status: DC

## 2017-12-22 NOTE — Progress Notes (Signed)
Patient ID: Anavey Coombes, female   DOB: [birth year], 72 y.o.   MRN: [id number] CSW spoke with Pt's daughter in law and son to check in regarding rescheduling family meeting and Pt's discharge for tomorrow due to an emergency surgery that the daughter-in -law had to have today.  She expresses concerns regarding Pt's baseline and that she is not herself. She describes instances of confusion about which day/what she ate etc.  She asks if this is her new baseline.  CSW agreed that it is likely that this is her new baseline. CSW provided support and listened to her concerns about care giving for the Pt and all that they have going on with other oblbigaations.  The family shares they do not "believe in placing her in assisted living or anything", which is not an option at this point due to lack of funding.  They agreed to meet at 11am tomorrow 6/5 to discuss discharge and to take Pt home as CSW explained we have done all we can within the hospital.  They asked for Dr. Bary Leriche to call them. CSW agreed to pass on this request.  Dossie Arbour, LCSW

## 2017-12-22 NOTE — Plan of Care (Signed)
Patient is little confused about the place at times.Denies SI,HI and AVH.Looking forward for discharge tomorrow.

## 2017-12-22 NOTE — Progress Notes (Signed)
[facility] MD Progress Note  12/22/2017 9:01 AM Joyce [name]  MRN:  [id number]  Subjective:   We were ready to discharge Joyce [name] with her family today after a family meeting. Unfortunately, her daughter in law with whom the patient resides, had emergency surgery this morning. Discharge postponed. Joyce [name] has no complaints and feels ready to go home. Indeed, manic symptoms resolved but she slept 4 hours only. We will recheck Tegretol level as her dose was increased during this hospital;ization. She however seems more confused today. She insists it is Monday and her dialysis day.   Principal Problem: Bipolar I disorder, most recent episode (or current) manic ([place]) Diagnosis:   Patient Active Problem List   Diagnosis Date Noted  . Bipolar affective disorder, current episode depressed with psychotic symptoms ([place]) [F31.2] 09/17/2017    Priority: High  . Bipolar I disorder, most recent episode (or current) manic ([place]) [F31.10] 09/02/2017    Priority: High  . UTI (urinary tract infection) [N39.0] 12/16/2017  . Bipolar 1 disorder, depressed ([place]) [F31.9] 12/15/2017  . Palliative care encounter [Z51.5]   . Acute delirium [R41.0] 09/02/2017  . Altered mental status [R41.82]   . Acute encephalopathy [G93.40] 09/01/2017  . ESRD on dialysis ([place]) [N18.6, Z99.2] 04/06/2017  . Complication of vascular access for dialysis [T82.9XXA] 04/06/2017  . Hematuria [R31.9] 12/18/2016  . Polycystic kidney [Q61.3] 10/12/2016  . Ruptured cyst of kidney [Q61.00] 10/03/2016  . HTN (hypertension) [I10] 10/01/2016  . Chronic kidney disease [N18.9] 10/01/2016  . Anemia associated with chronic renal failure [N18.9, D63.1] 09/24/2016  . Acute kidney insufficiency [N28.9] 07/28/2016  . Noninfectious diarrhea [K52.9]   . Benign neoplasm of cecum [D12.0]   . Benign neoplasm of ascending colon [D12.2]   . Diarrhea [R19.7]   . Nausea [R11.0]   . Gastric polyp [K31.7]   . Chronic constipation [K59.09] 12/27/2014  .  Nausea with vomiting [R11.2] 12/27/2014  . Left sided abdominal pain [R10.9] 12/27/2014  . Personal history of colonic polyps [Z86.010] 05/10/2013   Total Time spent with patient: 15 minutes  Past Psychiatric History: bipolar  Past Medical History:  Past Medical History:  Diagnosis Date  . Anal fissure   . Bipolar affective disorder ([place])   . CKD (chronic kidney disease)    Dr Holley Raring [name] 4  . Colon polyps   . Diverticulitis   . Diverticulitis   . Family history of adverse reaction to anesthesia    mom - PONV  . GERD (gastroesophageal reflux disease)   . Headache    migraines - none over 10 yrs  . Heart murmur   . History of hiatal hernia   . Hypertension   . Pancreatitis    Valproic acid  . Vertigo     Past Surgical History:  Procedure Laterality Date  . A/V FISTULAGRAM Left 04/14/2017   Procedure: A/V Fistulagram;  Surgeon: Katha Cabal, MD;  Location: [place] [facility];  Service: Cardiovascular;  Laterality: Left;  . A/V FISTULAGRAM Left 06/09/2017   Procedure: A/V FISTULAGRAM;  Surgeon: Katha Cabal, MD;  Location: [place] [facility];  Service: Cardiovascular;  Laterality: Left;  . A/V FISTULAGRAM Left 11/02/2017   Procedure: A/V FISTULAGRAM;  Surgeon: Algernon Huxley, MD;  Location: [place] [facility];  Service: Cardiovascular;  Laterality: Left;  . A/V SHUNT INTERVENTION N/A 11/02/2017   Procedure: A/V SHUNT INTERVENTION;  Surgeon: Algernon Huxley, MD;  Location: [place] [facility];  Service: Cardiovascular;  Laterality: N/A;  .  ABDOMINAL HYSTERECTOMY  1990 ?  . AV FISTULA PLACEMENT  4/30  . BREAST EXCISIONAL BIOPSY Left 1994   neg surgical bx  . CHOLECYSTECTOMY  2003  . COLONOSCOPY  2014   Dr. Jamal Collin  . COLONOSCOPY WITH PROPOFOL N/A 09/24/2015   Procedure: COLONOSCOPY WITH random colon byopies.;  Surgeon: Lucilla Lame, MD;  Location: [place];  Service: Endoscopy;  Laterality: N/A;  . ESOPHAGOGASTRODUODENOSCOPY (EGD) WITH PROPOFOL N/A  09/24/2015   Procedure: ESOPHAGOGASTRODUODENOSCOPY (EGD) ;  Surgeon: Lucilla Lame, MD;  Location: [place];  Service: Endoscopy;  Laterality: N/A;  . EYE SURGERY    . PERIPHERAL VASCULAR CATHETERIZATION N/A 05/29/2015   Procedure: A/V Shuntogram/Fistulagram;  Surgeon: Katha Cabal, MD;  Location: [place] [facility];  Service: Cardiovascular;  Laterality: N/A;  . PERIPHERAL VASCULAR CATHETERIZATION N/A 05/29/2015   Procedure: A/V Shunt Intervention;  Surgeon: Katha Cabal, MD;  Location: [place] [facility];  Service: Cardiovascular;  Laterality: N/A;  . POLYPECTOMY  09/24/2015   Procedure: POLYPECTOMY INTESTINAL;  Surgeon: Lucilla Lame, MD;  Location: [place];  Service: Endoscopy;;  cecal polyp ascending polyp   Family History:  Family History  Problem Relation Age of Onset  . Stroke Father   . Hypertension Father   . Breast cancer Other   . Colon cancer Neg Hx   . Liver disease Neg Hx    Family Psychiatric  History: none Social History:  Social History   Substance and Sexual Activity  Alcohol Use No  . Alcohol/week: 0.0 oz     Social History   Substance and Sexual Activity  Drug Use No    Social History   Socioeconomic History  . Marital status: Single    Spouse name: Not on file  . Number of children: 1  . Years of education: Not on file  . Highest education level: Not on file  Occupational History  . Occupation: Surveyor, quantity: [employer]  Social Needs  . Financial resource strain: Not on file  . Food insecurity:    Worry: Not on file    Inability: Not on file  . Transportation needs:    Medical: Not on file    Non-medical: Not on file  Tobacco Use  . Smoking status: Never Smoker  . Smokeless tobacco: Never Used  Substance and Sexual Activity  . Alcohol use: No    Alcohol/week: 0.0 oz  . Drug use: No  . Sexual activity: Not Currently  Lifestyle  . Physical activity:    Days per week: Not on file    Minutes per session:  Not on file  . Stress: Not on file  Relationships  . Social connections:    Talks on phone: Not on file    Gets together: Not on file    Attends religious service: Not on file    Active member of club or organization: Not on file    Attends meetings of clubs or organizations: Not on file    Relationship status: Not on file  Other Topics Concern  . 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   Additional Social History:                         Sleep: Fair  Appetite:  Fair  Current Medications: Current Facility-Administered Medications  Medication Dose Route Frequency Provider Last  Rate Last Dose  . 0.9 %  sodium chloride infusion  100 mL Intravenous PRN Lateef, Munsoor, MD      . 0.9 %  sodium chloride infusion  100 mL Intravenous PRN Lateef, Munsoor, MD      . 0.9 %  sodium chloride infusion  100 mL Intravenous PRN Lateef, Munsoor, MD      . 0.9 %  sodium chloride infusion  100 mL Intravenous PRN Lateef, Munsoor, MD      . acetaminophen (TYLENOL) tablet 325 mg  325 mg Oral Q6H PRN Arjuna Doeden B, MD   325 mg at 12/19/17 0129  . alteplase (CATHFLO ACTIVASE) injection 2 mg  2 mg Intracatheter Once PRN Lateef, Munsoor, MD      . alteplase (CATHFLO ACTIVASE) injection 2 mg  2 mg Intracatheter Once PRN Lateef, Munsoor, MD      . alum & mag hydroxide-simeth (MAALOX/MYLANTA) 468-032-12 MG/5ML suspension 30 mL  30 mL Oral Q4H PRN Clapacs, [name], MD      . brimonidine (ALPHAGAN) 0.2 % ophthalmic solution 1 drop  1 drop Both Eyes BID Kapil Petropoulos B, MD   1 drop at 12/22/17 0816  . carbamazepine (TEGRETOL) tablet 200 mg  200 mg Oral TID Shahab Polhamus B, MD   200 mg at 12/22/17 0815  . cephALEXin (KEFLEX) capsule 250 mg  250 mg Oral Q8H Devinn Voshell B, MD   250 mg at 12/22/17 0553  . dicyclomine (BENTYL) tablet 20 mg  20 mg Oral TID AC Clapacs, [name], MD   20 mg at  12/22/17 0815  . famotidine (PEPCID) tablet 20 mg  20 mg Oral Daily Clapacs, [name], MD   20 mg at 12/22/17 0815  . haloperidol (HALDOL) tablet 2 mg  2 mg Oral BH-q7a Raley Novicki B, MD   2 mg at 12/22/17 0552  . haloperidol (HALDOL) tablet 5 mg  5 mg Oral QHS Saraiah Bhat B, MD   5 mg at 12/21/17 2055  . heparin injection 1,000 Units  1,000 Units Dialysis PRN Lateef, Munsoor, MD      . heparin injection 1,000 Units  1,000 Units Dialysis PRN Lateef, Munsoor, MD      . lidocaine (PF) (XYLOCAINE) 1 % injection 5 mL  5 mL Intradermal PRN Lateef, Munsoor, MD      . lidocaine (PF) (XYLOCAINE) 1 % injection 5 mL  5 mL Intradermal PRN Lateef, Munsoor, MD      . lidocaine-prilocaine (EMLA) cream 1 application  1 application Topical PRN Lateef, Munsoor, MD      . LORazepam (ATIVAN) tablet 2 mg  2 mg Oral QHS Ramiel Forti B, MD   2 mg at 12/21/17 2055  . magnesium hydroxide (MILK OF MAGNESIA) suspension 30 mL  30 mL Oral Daily PRN Clapacs, [name], MD      . metoprolol tartrate (LOPRESSOR) tablet 25 mg  25 mg Oral BID Clapacs, [name], MD   25 mg at 12/22/17 0815  . midodrine (PROAMATINE) tablet 10 mg  10 mg Oral Daily Clapacs, [name], MD   10 mg at 12/22/17 0816  . OLANZapine (ZYPREXA) tablet 15 mg  15 mg Oral QHS Clapacs, [name], MD   15 mg at 12/21/17 2055  . pentafluoroprop-tetrafluoroeth (GEBAUERS) aerosol 1 application  1 application Topical PRN Lateef, Munsoor, MD      . rOPINIRole (REQUIP) tablet 2 mg  2 mg Oral Daily Korene Dula B, MD   2 mg at 12/21/17 1831  .  timolol (TIMOPTIC) 0.5 % ophthalmic solution 1 drop  1 drop Both Eyes BID Clapacs, [name], MD   1 drop at 12/22/17 5176    Lab Results:  Results for orders placed or performed during the hospital encounter of 12/15/17 (from the past 48 hour(s))  Urinalysis, Complete w Microscopic     Status: Abnormal   Collection Time: 12/21/17  1:57 PM  Result Value Ref Range   Color, Urine YELLOW (A) YELLOW   APPearance  CLEAR (A) CLEAR   Specific Gravity, Urine 1.006 1.005 - 1.030   pH 7.0 5.0 - 8.0   Glucose, UA NEGATIVE NEGATIVE mg/dL   Hgb urine dipstick NEGATIVE NEGATIVE   Bilirubin Urine NEGATIVE NEGATIVE   Ketones, ur NEGATIVE NEGATIVE mg/dL   Protein, ur NEGATIVE NEGATIVE mg/dL   Nitrite NEGATIVE NEGATIVE   Leukocytes, UA TRACE (A) NEGATIVE   RBC / HPF 0-5 0 - 5 RBC/hpf   WBC, UA 0-5 0 - 5 WBC/hpf   Bacteria, UA RARE (A) NONE SEEN   Squamous Epithelial / LPF 0-5 0 - 5    Comment: Performed at [facility], [address] [place], [place] [address]  Phosphorus     Status: None   Collection Time: 12/22/17  6:31 AM  Result Value Ref Range   Phosphorus 3.5 2.5 - 4.6 mg/dL    Comment: Performed at [facility], [address] [place], [place] [address]    Blood Alcohol level:  Lab Results  Component Value Date   [facility] <10 12/14/2017   ETH <10 69/48/5462    Metabolic Disorder Labs: Lab Results  Component Value Date   HGBA1C 4.8 09/18/2017   MPG 91.06 09/18/2017   No results found for: PROLACTIN Lab Results  Component Value Date   CHOL 251 (H) 09/18/2017   TRIG 124 09/18/2017   HDL 66 09/18/2017   CHOLHDL 3.8 09/18/2017   VLDL 25 09/18/2017   LDLCALC 160 (H) 09/18/2017   LDLCALC 43 05/14/2014    Physical Findings: AIMS: Facial and Oral Movements Muscles of Facial Expression: None, normal Lips and Perioral Area: None, normal Jaw: None, normal Tongue: None, normal,Extremity Movements Upper (arms, wrists, hands, fingers): None, normal Lower (legs, knees, ankles, toes): None, normal, Trunk Movements Neck, shoulders, hips: None, normal, Overall Severity Severity of abnormal movements (highest score from questions above): None, normal Incapacitation due to abnormal movements: None, normal Patient's awareness of abnormal movements (rate only patient's report): No Awareness, Dental Status Current problems with teeth and/or dentures?: No Does patient usually  wear dentures?: No  CIWA:  CIWA-Ar Total: 2 COWS:  COWS Total Score: 2  Musculoskeletal: Strength & Muscle Tone: within normal limits Gait & Station: normal Patient leans: N/A  Psychiatric Specialty Exam: Physical Exam  Nursing note and vitals reviewed. Psychiatric: She has a normal mood and affect. Her speech is normal and behavior is normal. Judgment and thought content normal. Cognition and memory are normal.    Review of Systems  Neurological: Negative.   Psychiatric/Behavioral: Negative.   All other systems reviewed and are negative.   Blood pressure 138/61, pulse 75, temperature 98.1 F (36.7 C), temperature source Oral, resp. rate 18, height 5\' 3"  (1.6 m), weight 72 kg (158 lb 11.7 oz), SpO2 97 %.Body mass index is 28.12 kg/m.  General Appearance: Casual  Eye Contact:  Good  Speech:  Clear and Coherent  Volume:  Normal  Mood:  Euthymic  Affect:  Appropriate  Thought Process:  Goal Directed and Descriptions of  Associations: Intact  Orientation:  Full (Time, Place, and Person)  Thought Content:  WDL  Suicidal Thoughts:  No  Homicidal Thoughts:  No  Memory:  Immediate;   Poor Recent;   Poor Remote;   Poor  Judgement:  Fair  Insight:  Shallow  Psychomotor Activity:  Normal  Concentration:  Concentration: Fair and Attention Span: Fair  Recall:  [employer] of Knowledge:  Fair  Language:  Fair  Akathisia:  No  Handed:  Right  AIMS (if indicated):     Assets:  Communication Skills Desire for Improvement Financial Resources/Insurance Housing Resilience Social Support  ADL's:  Intact  Cognition:  WNL  Sleep:  Number of Hours: 4.25     Treatment Plan Summary: Daily contact with patient to assess and evaluate symptoms and progress in treatment and Medication management   Ms. Hanahan is a 72 year old female with a history of bipolar illness and ESRD admitted for agitation. The family reports frequent agitated behavior and believes that her bipolar is not under  control.We could not discharge Taresa as planned today due to family emergency. She slept poorly last night.  #Mood/psychosis -continue Tegretol at a higher dose 200 mg TID, Tegretol level -continue Zyprexa 15 mg nightly -continue haldol 2 mg in am, 5 mg qhs  #Social -there is a new HCPOA scanned on the chart -her son is designated -patient expressly refuses ECT  #Insomnia -Ativan 2 mg nightly  #UTI -continue Keflex 250 mg TID  #ESRD, on dialysis MWF -nephrology help is greatly appreciated -continue Midodrin 10 mg daily  #HTN -Metoprolol 25 mg BID  #Restless legs -continue Requip 2 mgat 15:00 daily  #GERD -continue Pepcid 20 mg daily and Bentyl 20 mg TID  #Metabolic syndrome monitoring -labs were obtained recently  #Disposition -discharge to home with family -follow up with Dr. Thurmond Butts on June 26 ay 4:30    Orson Slick, MD 12/22/2017, 9:01 AM

## 2017-12-22 NOTE — BHH Group Notes (Signed)
12/22/2017 1PM  Type of Therapy/Topic:  Group Therapy:  Feelings about Diagnosis  Participation Level:  Did Not Attend   Description of Group:   This group will allow patients to explore their thoughts and feelings about diagnoses they have received. Patients will be guided to explore their level of understanding and acceptance of these diagnoses. Facilitator will encourage patients to process their thoughts and feelings about the reactions of others to their diagnosis and will guide patients in identifying ways to discuss their diagnosis with significant others in their lives. This group will be process-oriented, with patients participating in exploration of their own experiences, giving and receiving support, and processing challenge from other group members.   Therapeutic Goals: 1. Patient will demonstrate understanding of diagnosis as evidenced by identifying two or more symptoms of the disorder 2. Patient will be able to express two feelings regarding the diagnosis 3. Patient will demonstrate their ability to communicate their needs through discussion and/or role play  Summary of Patient Progress: Patient was encouraged and invited to attend group. Patient did not attend group. Social worker will continue to encourage group participation in the future.        Therapeutic Modalities:   Cognitive Behavioral Therapy Brief Therapy Feelings Identification    Darin Engels, LCSW 12/22/2017 2:54 PM

## 2017-12-22 NOTE — Progress Notes (Signed)
Recreation Therapy Notes  INPATIENT RECREATION TR PLAN  Patient Details Name: Joyce [name] MRN: [id number] DOB: 03/26/1946 Today's Date: 12/22/2017  Rec Therapy Plan Is patient appropriate for Therapeutic Recreation?: Yes Treatment times per week: at least 3 Estimated Length of Stay: 5-7 days TR Treatment/Interventions: Group participation (Comment)  Discharge Criteria Pt will be discharged from therapy if:: Discharged Treatment plan/goals/alternatives discussed and agreed upon by:: Patient/family  Discharge Summary Short term goals set: Patient will engage in groups without prompting or encouragement from LRT x3 group sessions within 5 recreation therapy group sessions Short term goals met: Adequate for discharge Progress toward goals comments: Groups attended Which groups?: Goal setting, Leisure education, Other (Comment)(Happiness) Reason goals not met: N/A Therapeutic equipment acquired: N/A Reason patient discharged from therapy: Discharge from hospital Pt/family agrees with progress & goals achieved: Yes Date patient discharged from therapy: 12/22/17      12/22/2017, 12:45 PM  

## 2017-12-22 NOTE — BHH Group Notes (Signed)
[place] Group Notes:  (Nursing/MHT/Case Management/Adjunct)  Date:  12/22/2017  Time:  2:59 PM  Type of Therapy:  Psychoeducational Skills  Participation Level:  Did Not Attend    Drake Leach 12/22/2017, 2:59 PM

## 2017-12-22 NOTE — Progress Notes (Signed)
Recreation Therapy Notes  Date: 12/22/2017  Time: 9:30 am  Location: Craft Room  Behavioral response: Appropriate   Intervention Topic: Goals  Discussion/Intervention:  Group content on today was focused on goals. Patients described what goals are and how they define goals. Individuals expressed how they go about setting goals and reaching them. The group identified how important goals are and if they make short term goals to reach long term goals. Patients described how many goals they work on at a time and what affects them not reaching their goal. Individuals described how much time they put into planning and obtaining their goals. The group participated in the intervention "My Goal Board" and made personal goal boards to help them achieve their goal. Clinical Observations/Feedback:  Patient came to group and was focused on what her peers and staff had to say about goals. Individual participated in the intervention and was social with peers and staff during group. Marguita Venning LRT/CTRS         Chelsy Parrales 12/22/2017 10:40 AM

## 2017-12-23 MED ORDER — OLANZAPINE 15 MG PO TABS: 15.0000 mg | ORAL_TABLET | Freq: Every day | ORAL | 1 refills | Status: DC

## 2017-12-23 NOTE — Progress Notes (Signed)
Patient ID: Joyce [name], female   DOB: Dec 14, 1945, 72 y.o.   MRN: [id number]  Discharge Note:  Patient denies SI/HI/AVH at this time. Discharge instructions, AVS, transition record, and prescriptions gone over with patient and family. Patient agrees to comply with medication management, follow-up visit, and outpatient therapy. Patient belongings returned to patient. Patient and family questions and concerns addressed and answered. Patient ambulatory off unit. Patient discharged to home with son and daughter-in-law.

## 2017-12-23 NOTE — BHH Suicide Risk Assessment (Signed)
[facility] INPATIENT:  Family/Significant Other Suicide Prevention Education  Suicide Prevention Education:  Education Completed; Son and Daughter in Sports coach, [name] and [name] ,  (name of family member/significant other) has been identified by the patient as the family member/significant other with whom the patient will be residing, and identified as the person(s) who will aid the patient in the event of a mental health crisis (suicidal ideations/suicide attempt).  With written consent from the patient, the family member/significant other has been provided the following suicide prevention education, prior to the and/or following the discharge of the patient.  The suicide prevention education provided includes the following:  Suicide risk factors  Suicide prevention and interventions  National Suicide Hotline telephone number  [facility] Advocate assessment telephone number  [facility] Emergency Assistance [place] and/or Residential Mobile Crisis Unit telephone number  Request made of family/significant other to:  Remove weapons (e.g., guns, rifles, knives), all items previously/currently identified as safety concern.    Remove drugs/medications (over-the-counter, prescriptions, illicit drugs), all items previously/currently identified as a safety concern.  The family member/significant other verbalizes understanding of the suicide prevention education information provided.  The family member/significant other agrees to remove the items of safety concern listed above.  Carloyn Jaeger Rosell Khouri 12/23/2017, 2:38 PM

## 2017-12-23 NOTE — Progress Notes (Signed)
Post HD assessment    12/23/17 1302  Neurological  Level of Consciousness Alert  Orientation Level Oriented to person;Oriented to place;Oriented to situation  Respiratory  Respiratory Pattern Regular;Unlabored  Chest Assessment Chest expansion symmetrical  Cardiac  ECG Monitor Yes  Antiarrhythmic device No  Vascular  R Radial Pulse +2  L Radial Pulse +2  Edema Generalized  Integumentary  Integumentary (WDL) X  Skin Color Appropriate for ethnicity  Musculoskeletal  Musculoskeletal (WDL) X  Generalized Weakness Yes  Assistive Device Wheelchair  GU Assessment  Genitourinary (WDL) X  Genitourinary Symptoms  (HD)  Psychosocial  Psychosocial (WDL) WDL

## 2017-12-23 NOTE — Progress Notes (Signed)
Pre HD assessment    12/23/17 0935  Vital Signs  Temp (!) 97.4 F (36.3 C)  Temp Source Oral  Pulse Rate (!) 58  Pulse Rate Source Monitor  Resp 13  BP (!) 123/108  BP Location Right Arm  BP Method Automatic  Patient Position (if appropriate) Sitting  Oxygen Therapy  SpO2 98 %  O2 Device Room Air  Pain Assessment  Pain Scale 0-10  Pain Score 0  Dialysis Weight  Weight 74.1 kg (163 lb 5.8 oz)  Type of Weight Pre-Dialysis  Time-Out for Hemodialysis  What Procedure? HD  Pt Identifiers(min of two) First/Last Name;MRN/Account#  Correct Site? Yes  Correct Side? Yes  Correct Procedure? Yes  Consents Verified? Yes  Rad Studies Available? N/A  Safety Precautions Reviewed? Yes  Engineer, civil (consulting) Number  (5A)  Station Number 3  UF/Alarm Test Passed  Conductivity: Meter  (13.8)  Conductivity: Machine  14.1  pH 7.4  Reverse Osmosis main  Normal Saline Lot Number [id number]  Dialyzer Lot Number [id number]  Disposable Set Lot Number [id number]  Machine Temperature 98.6 F (37 C)  Musician and Audible Yes  Blood Lines Intact and Secured Yes  Pre Treatment Patient Checks  Vascular access used during treatment Fistula  Patient is receiving dialysis in a chair Yes  Hepatitis B Surface Antigen Results Negative  Date Hepatitis B Surface Antigen Drawn 12/16/17  Hepatitis B Surface Antibody  (<10)  Date Hepatitis B Surface Antibody Drawn 09/18/17  Hemodialysis Consent Verified Yes  Hemodialysis Standing Orders Initiated Yes  ECG (Telemetry) Monitor On Yes  Prime Ordered Normal Saline  Length of  DialysisTreatment -hour(s) 3 Hour(s)  Dialyzer Elisio 17H NR  Dialysate 3K, 2.5 Ca  Dialysis Anticoagulant None  Dialysate Flow Ordered 600  Blood Flow Rate Ordered 400 mL/min  Ultrafiltration Goal 1.5 Liters  Pre Treatment Labs Renal panel;CBC  Dialysis Blood Pressure Support Ordered Normal Saline  Education / Care Plan  Dialysis Education Provided Yes  Documented  Education in Care Plan Yes

## 2017-12-23 NOTE — Progress Notes (Signed)
[facility]  ROUNDING NOTE   Subjective:   Seen and examined on hemodialysis. Tolerating treatment well.     HEMODIALYSIS FLOWSHEET:  Blood Flow Rate (mL/min): 300 mL/min Arterial Pressure (mmHg): -150 mmHg Venous Pressure (mmHg): 240 mmHg Transmembrane Pressure (mmHg): 50 mmHg Ultrafiltration Rate (mL/min): 660 mL/min Dialysate Flow Rate (mL/min): 600 ml/min Conductivity: Machine : 14.3 Conductivity: Machine : 14.3 Dialysis Fluid Bolus: Normal Saline Bolus Amount (mL): 250 mL    Objective:  Vital signs in last 24 hours:  Temp:  [97.4 F (36.3 C)-97.5 F (36.4 C)] 97.5 F (36.4 C) (06/05 1303) Pulse Rate:  [54-67] 67 (06/05 1303) Resp:  [11-21] 21 (06/05 1303) BP: (100-150)/(45-108) 149/50 (06/05 1303) SpO2:  [98 %-100 %] 99 % (06/05 1303) Weight:  [73.1 kg (161 lb 2.5 oz)-74.1 kg (163 lb 5.8 oz)] 73.1 kg (161 lb 2.5 oz) (06/05 1303)  Weight change:  Filed Weights   12/16/17 1234 12/23/17 0935 12/23/17 1303  Weight: 72 kg (158 lb 11.7 oz) 74.1 kg (163 lb 5.8 oz) 73.1 kg (161 lb 2.5 oz)    Intake/Output: I/O last 3 completed shifts: In: 840 [P.O.:840] Out: -    Intake/Output this shift:  Total I/O In: 240 [P.O.:240] Out: 1514 [Other:1514]  Physical Exam: General: No acute distress  Head: Normocephalic, atraumatic. Moist oral mucosal membranes  Eyes: Anicteric  Neck: Supple, trachea midline  Lungs:  Clear to auscultation, normal effort  Heart: S1S2 no rubs  Abdomen:  Soft, nontender, bowel sounds present  Extremities: No peripheral edema.  Neurologic: Awake, alert, following commands  Skin: No lesions  Access: LUE AVF    Basic Metabolic Panel: Recent Labs  Lab 12/16/17 1719 12/18/17 1235 12/22/17 0631  NA 140  --   --   K 3.1*  --   --   CL 96*  --   --   CO2 32  --   --   GLUCOSE 116*  --   --   BUN 7  --   --   CREATININE 2.27*  --   --   CALCIUM 9.2  --   --   PHOS 2.3* 5.2* 3.5    Liver Function Tests: Recent Labs   Lab 12/16/17 1719  ALBUMIN 4.4   No results for input(s): LIPASE, AMYLASE in the last 168 hours. No results for input(s): AMMONIA in the last 168 hours.  CBC: Recent Labs  Lab 12/16/17 1719  WBC 6.6  HGB 13.0  HCT 38.0  MCV 103.0*  PLT 234    Cardiac Enzymes: No results for input(s): CKTOTAL, CKMB, CKMBINDEX, TROPONINI in the last 168 hours.  BNP: Invalid input(s): POCBNP  CBG: Recent Labs  Lab 12/16/17 2329  GLUCAP 115*    Microbiology: Results for orders placed or performed during the hospital encounter of 09/01/17  Urine culture     Status: Abnormal   Collection Time: 09/01/17  4:32 PM  Result Value Ref Range Status   Specimen Description   Final    URINE, RANDOM Performed at [facility], [address] [place], [place] [address]    Special Requests   Final    NONE Performed at [facility], [address] [place], [place] [address]    Culture MULTIPLE SPECIES PRESENT, SUGGEST RECOLLECTION (A)  Final   Report Status 09/03/2017 FINAL  Final  Culture, blood (routine x 2)     Status: None   Collection Time: 09/01/17  6:05 PM  Result Value Ref Range Status  Specimen Description BLOOD RIGHT HAND  Final   Special Requests   Final    BOTTLES DRAWN AEROBIC AND ANAEROBIC Blood Culture results may not be optimal due to an excessive volume of blood received in culture bottles   Culture   Final    NO GROWTH 5 DAYS Performed at [facility], [address], [place], [place] [address]    Report Status 09/06/2017 FINAL  Final  Culture, blood (routine x 2)     Status: None   Collection Time: 09/01/17  6:34 PM  Result Value Ref Range Status   Specimen Description BLOOD RAC  Final   Special Requests   Final    BOTTLES DRAWN AEROBIC AND ANAEROBIC Blood Culture adequate volume   Culture   Final    NO GROWTH 5 DAYS Performed at [facility], [address] [place], [place] [address]    Report Status 09/06/2017 FINAL   Final    Coagulation Studies: No results for input(s): LABPROT, INR in the last 72 hours.  Urinalysis: Recent Labs    12/21/17 1357  Fraser 1.006  PHURINE 7.0  GLUCOSEU NEGATIVE  HGBUR NEGATIVE  BILIRUBINUR NEGATIVE  KETONESUR NEGATIVE  PROTEINUR NEGATIVE  NITRITE NEGATIVE  LEUKOCYTESUR TRACE*      Imaging: No results found.   Medications:   . sodium chloride    . sodium chloride    . sodium chloride    . sodium chloride     . brimonidine  1 drop Both Eyes BID  . carbamazepine  200 mg Oral TID  . cephALEXin  250 mg Oral Q8H  . Chlorhexidine Gluconate Cloth  6 each Topical Q0600  . dicyclomine  20 mg Oral TID AC  . famotidine  20 mg Oral Daily  . haloperidol  2 mg Oral BH-q7a  . haloperidol  5 mg Oral QHS  . LORazepam  2 mg Oral QHS  . metoprolol tartrate  25 mg Oral BID  . midodrine  10 mg Oral Daily  . OLANZapine  15 mg Oral QHS  . rOPINIRole  2 mg Oral Daily  . timolol  1 drop Both Eyes BID   sodium chloride, sodium chloride, sodium chloride, sodium chloride, acetaminophen, alteplase, alteplase, alum & mag hydroxide-simeth, heparin, heparin, lidocaine (PF), lidocaine (PF), lidocaine-prilocaine, magnesium hydroxide, pentafluoroprop-tetrafluoroeth  Assessment/ Plan:  72 y.o. female with end stage renal disease on hemodialysis, diverticulosis, hypotension, bipolar disorder, polycystic kidney disease   [facility]/MWF  1.  ESRD on HD: seen and examined on hemodialysis. Tolerated treatment well.   2.  Anemia chronic kidney disease.  Hemoglobin 13. Holding EPO  3.  Secondary hyperparathyroidism: phosphorus at goal. Not currently on binders.   4.  Hypotension:   - Continue midodrine    LOS: 8 Joyce [name] 6/5/20192:05 PM

## 2017-12-23 NOTE — Plan of Care (Signed)
Patient slept through the night wit out any interruptions takes her medicines and cooperating with ADLs   contract for safety of self and others , denies SI/HI and no signs of AVH Problem: Coping: Goal: Ability to identify and develop effective coping behavior will improve Outcome: Progressing Goal: Ability to interact with others will improve Outcome: Progressing Goal: Demonstration of participation in decision-making regarding own care will improve Outcome: Progressing Goal: Ability to use eye contact when communicating with others will improve Outcome: Progressing   Problem: Education: Goal: Utilization of techniques to improve thought processes will improve Outcome: Progressing Goal: Knowledge of the prescribed therapeutic regimen will improve Outcome: Progressing   Problem: Health Behavior/Discharge Planning: Goal: Ability to make decisions will improve Outcome: Progressing Goal: Compliance with therapeutic regimen will improve Outcome: Progressing   Problem: Education: Goal: Knowledge of General Education information will improve Outcome: Progressing   Problem: Health Behavior/Discharge Planning: Goal: Ability to manage health-related needs will improve Outcome: Progressing   Problem: Activity: Goal: Risk for activity intolerance will decrease Outcome: Progressing   Problem: Nutrition: Goal: Adequate nutrition will be maintained Outcome: Progressing   Problem: Coping: Goal: Level of anxiety will decrease Outcome: Progressing   Problem: Elimination: Goal: Will not experience complications related to bowel motility Outcome: Progressing Goal: Will not experience complications related to urinary retention Outcome: Progressing   Problem: Pain Managment: Goal: General experience of comfort will improve Outcome: Progressing   Problem: Safety: Goal: Ability to remain free from injury will improve Outcome: Progressing   Problem: Skin Integrity: Goal: Risk for  impaired skin integrity will decrease Outcome: Progressing   Problem: Safety: Goal: Ability to disclose and discuss suicidal ideas will improve Outcome: Progressing Goal: Ability to identify and utilize support systems that promote safety will improve Outcome: Progressing

## 2017-12-23 NOTE — Progress Notes (Signed)
HD tx end   12/23/17 1252  Vital Signs  Pulse Rate 67  Pulse Rate Source Monitor  Resp 19  BP (!) 137/54  BP Location Right Arm  BP Method Automatic  Patient Position (if appropriate) Sitting  Oxygen Therapy  SpO2 98 %  O2 Device Room Air  During Hemodialysis Assessment  Dialysis Fluid Bolus Normal Saline  Bolus Amount (mL) 250 mL  Intra-Hemodialysis Comments Tx completed

## 2017-12-23 NOTE — Progress Notes (Signed)
Post HD assessment. Pt tolerated tx well without c/o. Pt's venous access continued to rise throughout tx, BFR was lowered,MDaware. Net [id number], goal met.    12/23/17 1303  Vital Signs  Temp (!) 97.5 F (36.4 C)  Temp Source Oral  Pulse Rate 67  Pulse Rate Source Monitor  Resp (!) 21  BP (!) 149/50  BP Location Right Arm  BP Method Automatic  Patient Position (if appropriate) Sitting  Oxygen Therapy  SpO2 99 %  O2 Device Room Air  Dialysis Weight  Weight 73.1 kg (161 lb 2.5 oz)  Type of Weight Post-Dialysis  Post-Hemodialysis Assessment  Rinseback Volume (mL) 250 mL  KECN 59.8 V  Dialyzer Clearance Lightly streaked  Duration of HD Treatment -hour(s) 3 hour(s)  Hemodialysis Intake (mL) 500 mL  UF Total -Machine (mL) 2014 mL  Net UF (mL) 1514 mL  Tolerated HD Treatment Yes  AVG/AVF Arterial Site Held (minutes) 10 minutes  AVG/AVF Venous Site Held (minutes) 10 minutes  Education / Care Plan  Dialysis Education Provided Yes  Documented Education in Care Plan Yes

## 2017-12-23 NOTE — Progress Notes (Signed)
HD tx start    12/23/17 0947  Vital Signs  Pulse Rate (!) 57  Pulse Rate Source Monitor  Resp 13  BP (!) 118/55  BP Location Right Arm  BP Method Automatic  Patient Position (if appropriate) Sitting  Oxygen Therapy  SpO2 98 %  O2 Device Room Air  During Hemodialysis Assessment  Blood Flow Rate (mL/min) 400 mL/min  Arterial Pressure (mmHg) -130 mmHg  Venous Pressure (mmHg) 170 mmHg  Transmembrane Pressure (mmHg) 50 mmHg  Ultrafiltration Rate (mL/min) 670 mL/min  Dialysate Flow Rate (mL/min) 600 ml/min  Conductivity: Machine  14.1  HD Safety Checks Performed Yes  Dialysis Fluid Bolus Normal Saline  Bolus Amount (mL) 250 mL  Intra-Hemodialysis Comments Tx initiated

## 2017-12-23 NOTE — Progress Notes (Signed)
Pre HD assessment    12/23/17 0936  Neurological  Level of Consciousness Alert  Orientation Level Oriented to person;Oriented to place;Oriented to situation  Respiratory  Respiratory Pattern Regular;Unlabored  Chest Assessment Chest expansion symmetrical  Cardiac  ECG Monitor Yes  Cardiac Rhythm SB  Antiarrhythmic device No  Vascular  R Radial Pulse +2  L Radial Pulse +2  Integumentary  Integumentary (WDL) X  Skin Color Appropriate for ethnicity  Musculoskeletal  Musculoskeletal (WDL) X  Generalized Weakness Yes  Assistive Device None;Wheelchair  GU Assessment  Genitourinary (WDL) X  Genitourinary Symptoms  (HD)  Psychosocial  Psychosocial (WDL) WDL  Patient Behaviors Calm;Cooperative

## 2017-12-23 NOTE — Progress Notes (Signed)
  [facility] Adult Case Management Discharge Plan :  Will you be returning to the same living situation after discharge:  Yes,   with son and daughter in law At discharge, do you have transportation home?: Yes,    Do you have the ability to pay for your medications: Yes,     Release of information consent forms completed and in the chart;  Patient's signature needed at discharge.  Patient to Follow up at: Follow-up Information    Lew Dawes, MD Follow up on 01/13/2018.   Specialty:  Psychiatry Why:  Your follow-up appointment is scheduled for 01/13/18 at 4:30 PM.  Dr. Reuel Derby office agreed to be in touch about potentially scheduling an earlier appointment after they are able to check their availability. They will call you at home to schedule. Contact information: [address] [place] [address] [phone]           Next level of care provider has access to [place] and Suicide Prevention discussed: Yes,     Have you used any form of tobacco in the last 30 days? (Cigarettes, Smokeless Tobacco, Cigars, and/or Pipes): No  Has patient been referred to the Quitline?: N/A patient is not a smoker  Patient has been referred for addiction treatment: Yes  August Saucer, LCSW 12/23/2017, 2:37 PM

## 2017-12-23 NOTE — Progress Notes (Signed)
Recreation Therapy Notes  Date: 12/23/2017  Time: 9:30 am   Location: Craft Room   Behavioral response: N/A   Intervention Topic:  Anger Management  Discussion/Intervention: Patient did not attend group.   Clinical Observations/Feedback:  Patient did not attend group.   Iantha Titsworth LRT/CTRS        Joyce [name] 12/23/2017 11:25 AM

## 2017-12-24 ENCOUNTER — Ambulatory Visit (INDEPENDENT_AMBULATORY_CARE_PROVIDER_SITE_OTHER): Payer: Medicare HMO | Admitting: Vascular Surgery

## 2017-12-24 ENCOUNTER — Encounter (INDEPENDENT_AMBULATORY_CARE_PROVIDER_SITE_OTHER): Payer: Medicare HMO

## 2017-12-24 ENCOUNTER — Other Ambulatory Visit: Payer: Self-pay | Admitting: Psychiatry

## 2017-12-25 DIAGNOSIS — N186 End stage renal disease: Secondary | ICD-10-CM | POA: Diagnosis not present

## 2017-12-25 DIAGNOSIS — Z992 Dependence on renal dialysis: Secondary | ICD-10-CM | POA: Diagnosis not present

## 2017-12-28 DIAGNOSIS — N186 End stage renal disease: Secondary | ICD-10-CM | POA: Diagnosis not present

## 2017-12-28 DIAGNOSIS — Z992 Dependence on renal dialysis: Secondary | ICD-10-CM | POA: Diagnosis not present

## 2017-12-30 DIAGNOSIS — N186 End stage renal disease: Secondary | ICD-10-CM | POA: Diagnosis not present

## 2017-12-30 DIAGNOSIS — Z992 Dependence on renal dialysis: Secondary | ICD-10-CM | POA: Diagnosis not present

## 2018-01-01 DIAGNOSIS — N186 End stage renal disease: Secondary | ICD-10-CM | POA: Diagnosis not present

## 2018-01-01 DIAGNOSIS — Z992 Dependence on renal dialysis: Secondary | ICD-10-CM | POA: Diagnosis not present

## 2018-01-04 DIAGNOSIS — N186 End stage renal disease: Secondary | ICD-10-CM | POA: Diagnosis not present

## 2018-01-04 DIAGNOSIS — Z992 Dependence on renal dialysis: Secondary | ICD-10-CM | POA: Diagnosis not present

## 2018-01-05 ENCOUNTER — Encounter (INDEPENDENT_AMBULATORY_CARE_PROVIDER_SITE_OTHER): Payer: Self-pay | Admitting: Vascular Surgery

## 2018-01-06 DIAGNOSIS — N186 End stage renal disease: Secondary | ICD-10-CM | POA: Diagnosis not present

## 2018-01-06 DIAGNOSIS — Z992 Dependence on renal dialysis: Secondary | ICD-10-CM | POA: Diagnosis not present

## 2018-01-08 DIAGNOSIS — N186 End stage renal disease: Secondary | ICD-10-CM | POA: Diagnosis not present

## 2018-01-08 DIAGNOSIS — Z992 Dependence on renal dialysis: Secondary | ICD-10-CM | POA: Diagnosis not present

## 2018-01-11 DIAGNOSIS — N186 End stage renal disease: Secondary | ICD-10-CM | POA: Diagnosis not present

## 2018-01-11 DIAGNOSIS — Z992 Dependence on renal dialysis: Secondary | ICD-10-CM | POA: Diagnosis not present

## 2018-01-13 DIAGNOSIS — Z992 Dependence on renal dialysis: Secondary | ICD-10-CM | POA: Diagnosis not present

## 2018-01-13 DIAGNOSIS — N186 End stage renal disease: Secondary | ICD-10-CM | POA: Diagnosis not present

## 2018-01-15 DIAGNOSIS — Z992 Dependence on renal dialysis: Secondary | ICD-10-CM | POA: Diagnosis not present

## 2018-01-15 DIAGNOSIS — N186 End stage renal disease: Secondary | ICD-10-CM | POA: Diagnosis not present

## 2018-01-17 DIAGNOSIS — N186 End stage renal disease: Secondary | ICD-10-CM | POA: Diagnosis not present

## 2018-01-18 DIAGNOSIS — N186 End stage renal disease: Secondary | ICD-10-CM | POA: Diagnosis not present

## 2018-01-18 DIAGNOSIS — Z992 Dependence on renal dialysis: Secondary | ICD-10-CM | POA: Diagnosis not present

## 2018-01-18 DIAGNOSIS — Z23 Encounter for immunization: Secondary | ICD-10-CM | POA: Diagnosis not present

## 2018-01-20 DIAGNOSIS — Z23 Encounter for immunization: Secondary | ICD-10-CM | POA: Diagnosis not present

## 2018-01-20 DIAGNOSIS — Z992 Dependence on renal dialysis: Secondary | ICD-10-CM | POA: Diagnosis not present

## 2018-01-20 DIAGNOSIS — N186 End stage renal disease: Secondary | ICD-10-CM | POA: Diagnosis not present

## 2018-01-22 DIAGNOSIS — Z23 Encounter for immunization: Secondary | ICD-10-CM | POA: Diagnosis not present

## 2018-01-22 DIAGNOSIS — F3160 Bipolar disorder, current episode mixed, unspecified: Secondary | ICD-10-CM | POA: Diagnosis not present

## 2018-01-22 DIAGNOSIS — M549 Dorsalgia, unspecified: Secondary | ICD-10-CM | POA: Diagnosis not present

## 2018-01-22 DIAGNOSIS — Z992 Dependence on renal dialysis: Secondary | ICD-10-CM | POA: Diagnosis not present

## 2018-01-22 DIAGNOSIS — R109 Unspecified abdominal pain: Secondary | ICD-10-CM | POA: Diagnosis not present

## 2018-01-22 DIAGNOSIS — F39 Unspecified mood [affective] disorder: Secondary | ICD-10-CM | POA: Diagnosis not present

## 2018-01-22 DIAGNOSIS — R7989 Other specified abnormal findings of blood chemistry: Secondary | ICD-10-CM | POA: Diagnosis not present

## 2018-01-22 DIAGNOSIS — F3176 Bipolar disorder, in full remission, most recent episode depressed: Secondary | ICD-10-CM | POA: Diagnosis not present

## 2018-01-22 DIAGNOSIS — E876 Hypokalemia: Secondary | ICD-10-CM | POA: Diagnosis not present

## 2018-01-22 DIAGNOSIS — N186 End stage renal disease: Secondary | ICD-10-CM | POA: Diagnosis not present

## 2018-01-22 DIAGNOSIS — R4689 Other symptoms and signs involving appearance and behavior: Secondary | ICD-10-CM | POA: Diagnosis not present

## 2018-01-25 DIAGNOSIS — N186 End stage renal disease: Secondary | ICD-10-CM | POA: Diagnosis not present

## 2018-01-25 DIAGNOSIS — Z23 Encounter for immunization: Secondary | ICD-10-CM | POA: Diagnosis not present

## 2018-01-25 DIAGNOSIS — Z992 Dependence on renal dialysis: Secondary | ICD-10-CM | POA: Diagnosis not present

## 2018-01-27 DIAGNOSIS — Z23 Encounter for immunization: Secondary | ICD-10-CM | POA: Diagnosis not present

## 2018-01-27 DIAGNOSIS — Z992 Dependence on renal dialysis: Secondary | ICD-10-CM | POA: Diagnosis not present

## 2018-01-27 DIAGNOSIS — N186 End stage renal disease: Secondary | ICD-10-CM | POA: Diagnosis not present

## 2018-01-29 DIAGNOSIS — Z23 Encounter for immunization: Secondary | ICD-10-CM | POA: Diagnosis not present

## 2018-01-29 DIAGNOSIS — N186 End stage renal disease: Secondary | ICD-10-CM | POA: Diagnosis not present

## 2018-01-29 DIAGNOSIS — Z992 Dependence on renal dialysis: Secondary | ICD-10-CM | POA: Diagnosis not present

## 2018-02-01 DIAGNOSIS — Z992 Dependence on renal dialysis: Secondary | ICD-10-CM | POA: Diagnosis not present

## 2018-02-01 DIAGNOSIS — N186 End stage renal disease: Secondary | ICD-10-CM | POA: Diagnosis not present

## 2018-02-01 DIAGNOSIS — Z23 Encounter for immunization: Secondary | ICD-10-CM | POA: Diagnosis not present

## 2018-02-03 DIAGNOSIS — Z23 Encounter for immunization: Secondary | ICD-10-CM | POA: Diagnosis not present

## 2018-02-03 DIAGNOSIS — Z992 Dependence on renal dialysis: Secondary | ICD-10-CM | POA: Diagnosis not present

## 2018-02-03 DIAGNOSIS — N186 End stage renal disease: Secondary | ICD-10-CM | POA: Diagnosis not present

## 2018-02-08 DIAGNOSIS — Z23 Encounter for immunization: Secondary | ICD-10-CM | POA: Diagnosis not present

## 2018-02-08 DIAGNOSIS — Z992 Dependence on renal dialysis: Secondary | ICD-10-CM | POA: Diagnosis not present

## 2018-02-08 DIAGNOSIS — N186 End stage renal disease: Secondary | ICD-10-CM | POA: Diagnosis not present

## 2018-02-09 DIAGNOSIS — F3161 Bipolar disorder, current episode mixed, mild: Secondary | ICD-10-CM | POA: Diagnosis not present

## 2018-02-10 DIAGNOSIS — Z23 Encounter for immunization: Secondary | ICD-10-CM | POA: Diagnosis not present

## 2018-02-10 DIAGNOSIS — N186 End stage renal disease: Secondary | ICD-10-CM | POA: Diagnosis not present

## 2018-02-10 DIAGNOSIS — Z992 Dependence on renal dialysis: Secondary | ICD-10-CM | POA: Diagnosis not present

## 2018-02-12 DIAGNOSIS — Z992 Dependence on renal dialysis: Secondary | ICD-10-CM | POA: Diagnosis not present

## 2018-02-12 DIAGNOSIS — Z23 Encounter for immunization: Secondary | ICD-10-CM | POA: Diagnosis not present

## 2018-02-12 DIAGNOSIS — N186 End stage renal disease: Secondary | ICD-10-CM | POA: Diagnosis not present

## 2018-02-15 DIAGNOSIS — N186 End stage renal disease: Secondary | ICD-10-CM | POA: Diagnosis not present

## 2018-02-15 DIAGNOSIS — Z23 Encounter for immunization: Secondary | ICD-10-CM | POA: Diagnosis not present

## 2018-02-15 DIAGNOSIS — Z992 Dependence on renal dialysis: Secondary | ICD-10-CM | POA: Diagnosis not present

## 2018-02-16 DIAGNOSIS — F3161 Bipolar disorder, current episode mixed, mild: Secondary | ICD-10-CM | POA: Diagnosis not present

## 2018-02-17 DIAGNOSIS — Z992 Dependence on renal dialysis: Secondary | ICD-10-CM | POA: Diagnosis not present

## 2018-02-17 DIAGNOSIS — N186 End stage renal disease: Secondary | ICD-10-CM | POA: Diagnosis not present

## 2018-02-17 DIAGNOSIS — Z23 Encounter for immunization: Secondary | ICD-10-CM | POA: Diagnosis not present

## 2018-02-18 DIAGNOSIS — F3161 Bipolar disorder, current episode mixed, mild: Secondary | ICD-10-CM | POA: Diagnosis not present

## 2018-02-19 DIAGNOSIS — N186 End stage renal disease: Secondary | ICD-10-CM | POA: Diagnosis not present

## 2018-02-19 DIAGNOSIS — Z992 Dependence on renal dialysis: Secondary | ICD-10-CM | POA: Diagnosis not present

## 2018-02-22 DIAGNOSIS — Z992 Dependence on renal dialysis: Secondary | ICD-10-CM | POA: Diagnosis not present

## 2018-02-22 DIAGNOSIS — N186 End stage renal disease: Secondary | ICD-10-CM | POA: Diagnosis not present

## 2018-02-23 DIAGNOSIS — F3161 Bipolar disorder, current episode mixed, mild: Secondary | ICD-10-CM | POA: Diagnosis not present

## 2018-02-24 DIAGNOSIS — Z992 Dependence on renal dialysis: Secondary | ICD-10-CM | POA: Diagnosis not present

## 2018-02-24 DIAGNOSIS — N186 End stage renal disease: Secondary | ICD-10-CM | POA: Diagnosis not present

## 2018-02-25 ENCOUNTER — Encounter: Payer: Self-pay | Admitting: Emergency Medicine

## 2018-02-25 ENCOUNTER — Other Ambulatory Visit: Payer: Self-pay

## 2018-02-25 ENCOUNTER — Non-Acute Institutional Stay
Admission: EM | Admit: 2018-02-25 | Discharge: 2018-02-26 | Disposition: A | Discharge status: Other Acute Inpatient Hospital | Payer: Medicare HMO | Attending: Emergency Medicine | Admitting: Emergency Medicine

## 2018-02-25 DIAGNOSIS — D631 Anemia in chronic kidney disease: Secondary | ICD-10-CM | POA: Insufficient documentation

## 2018-02-25 DIAGNOSIS — Q613 Polycystic kidney, unspecified: Secondary | ICD-10-CM | POA: Diagnosis not present

## 2018-02-25 DIAGNOSIS — Z8249 Family history of ischemic heart disease and other diseases of the circulatory system: Secondary | ICD-10-CM | POA: Diagnosis not present

## 2018-02-25 DIAGNOSIS — F312 Bipolar disorder, current episode manic severe with psychotic features: Secondary | ICD-10-CM | POA: Diagnosis not present

## 2018-02-25 DIAGNOSIS — N2581 Secondary hyperparathyroidism of renal origin: Secondary | ICD-10-CM | POA: Insufficient documentation

## 2018-02-25 DIAGNOSIS — N186 End stage renal disease: Secondary | ICD-10-CM | POA: Diagnosis not present

## 2018-02-25 DIAGNOSIS — Z86010 Personal history of colonic polyps: Secondary | ICD-10-CM | POA: Insufficient documentation

## 2018-02-25 DIAGNOSIS — G2581 Restless legs syndrome: Secondary | ICD-10-CM | POA: Diagnosis not present

## 2018-02-25 DIAGNOSIS — R011 Cardiac murmur, unspecified: Secondary | ICD-10-CM | POA: Insufficient documentation

## 2018-02-25 DIAGNOSIS — F315 Bipolar disorder, current episode depressed, severe, with psychotic features: Secondary | ICD-10-CM | POA: Insufficient documentation

## 2018-02-25 DIAGNOSIS — Z760 Encounter for issue of repeat prescription: Secondary | ICD-10-CM | POA: Diagnosis not present

## 2018-02-25 DIAGNOSIS — K219 Gastro-esophageal reflux disease without esophagitis: Secondary | ICD-10-CM | POA: Insufficient documentation

## 2018-02-25 DIAGNOSIS — Z992 Dependence on renal dialysis: Secondary | ICD-10-CM | POA: Diagnosis not present

## 2018-02-25 DIAGNOSIS — I120 Hypertensive chronic kidney disease with stage 5 chronic kidney disease or end stage renal disease: Secondary | ICD-10-CM | POA: Diagnosis not present

## 2018-02-25 DIAGNOSIS — Z8719 Personal history of other diseases of the digestive system: Secondary | ICD-10-CM | POA: Diagnosis not present

## 2018-02-25 DIAGNOSIS — Z880 Allergy status to penicillin: Secondary | ICD-10-CM | POA: Insufficient documentation

## 2018-02-25 DIAGNOSIS — R45851 Suicidal ideations: Secondary | ICD-10-CM | POA: Diagnosis not present

## 2018-02-25 DIAGNOSIS — Z885 Allergy status to narcotic agent status: Secondary | ICD-10-CM | POA: Insufficient documentation

## 2018-02-25 DIAGNOSIS — Z79899 Other long term (current) drug therapy: Secondary | ICD-10-CM | POA: Insufficient documentation

## 2018-02-25 DIAGNOSIS — F0391 Unspecified dementia with behavioral disturbance: Secondary | ICD-10-CM | POA: Diagnosis not present

## 2018-02-25 DIAGNOSIS — I10 Essential (primary) hypertension: Secondary | ICD-10-CM | POA: Diagnosis present

## 2018-02-25 DIAGNOSIS — F0390 Unspecified dementia without behavioral disturbance: Secondary | ICD-10-CM | POA: Diagnosis not present

## 2018-02-25 LAB — COMPREHENSIVE METABOLIC PANEL
ALT: 15 U/L (ref 0–44)
AST: 22 U/L (ref 15–41)
Albumin: 4.2 g/dL (ref 3.5–5.0)
Alkaline Phosphatase: 68 U/L (ref 38–126)
Anion gap: 12 (ref 5–15)
BUN: 20 mg/dL (ref 8–23)
CHLORIDE: 101 mmol/L (ref 98–111)
CO2: 28 mmol/L (ref 22–32)
Calcium: 9.4 mg/dL (ref 8.9–10.3)
Creatinine, Ser: 4.4 mg/dL — ABNORMAL HIGH (ref 0.44–1.00)
GFR calc Af Amer: 11 mL/min — ABNORMAL LOW (ref >60)
GFR, EST NON AFRICAN AMERICAN: 9 mL/min — AB (ref >60)
Glucose, Bld: 119 mg/dL — ABNORMAL HIGH (ref 70–99)
POTASSIUM: 3.6 mmol/L (ref 3.5–5.1)
Sodium: 141 mmol/L (ref 135–145)
Total Bilirubin: 0.6 mg/dL (ref 0.3–1.2)
Total Protein: 7.1 g/dL (ref 6.5–8.1)

## 2018-02-25 LAB — CBC
HEMATOCRIT: 36.9 % (ref 35.0–47.0)
HEMOGLOBIN: 12.9 g/dL (ref 12.0–16.0)
MCH: 34 pg (ref 26.0–34.0)
MCHC: 34.9 g/dL (ref 32.0–36.0)
MCV: 97.6 fL (ref 80.0–100.0)
Platelets: 205 10*3/uL (ref 150–440)
RBC: 3.78 MIL/uL — ABNORMAL LOW (ref 3.80–5.20)
RDW: 13.8 % (ref 11.5–14.5)
WBC: 8.2 10*3/uL (ref 3.6–11.0)

## 2018-02-25 LAB — URINALYSIS, COMPLETE (UACMP) WITH MICROSCOPIC
BILIRUBIN URINE: NEGATIVE
Bacteria, UA: NONE SEEN
Glucose, UA: NEGATIVE mg/dL
Hgb urine dipstick: NEGATIVE
KETONES UR: NEGATIVE mg/dL
LEUKOCYTES UA: NEGATIVE
Nitrite: NEGATIVE
PH: 7 (ref 5.0–8.0)
Protein, ur: NEGATIVE mg/dL
Specific Gravity, Urine: 1.013 (ref 1.005–1.030)

## 2018-02-25 LAB — URINE DRUG SCREEN, QUALITATIVE (ARMC ONLY)
Amphetamines, Ur Screen: NOT DETECTED
Barbiturates, Ur Screen: NOT DETECTED
COCAINE METABOLITE, UR ~~LOC~~: NOT DETECTED
Cannabinoid 50 Ng, Ur ~~LOC~~: NOT DETECTED
MDMA (ECSTASY) UR SCREEN: NOT DETECTED
METHADONE SCREEN, URINE: NOT DETECTED
OPIATE, UR SCREEN: NOT DETECTED
Phencyclidine (PCP) Ur S: NOT DETECTED
Tricyclic, Ur Screen: NOT DETECTED

## 2018-02-25 LAB — ETHANOL

## 2018-02-25 NOTE — Progress Notes (Signed)
Pt A & O X3. Denies SI, HI, AVH and pain "not right now". Calm on interaction. Cooperative with care at this time. Per pt "I live with my son and his wife but my daughter in law told me to come here or go to the rest home because I disagree with her and her doctor about my medications". "I don't feel like I'm at my full dose yet but they disagree with me". Emotional support offered. Encouraged pt to voice concerns. Pt assessed by ED doctor. Pending psych evaluation. Safety checks initiated at Q 15 minutes intervals without self harm gestures or outburst thus far.

## 2018-02-25 NOTE — ED Notes (Signed)
Pt. Alert and oriented, pt. Laying in bed watching TV in no acute distress.  Pt. Asked when she would be talking to psychiatrist.  Pt. Told we would be bring in a tv/camera(SOC machine) to talk to psychiatrist.  Pt. Ok with this arrangement.

## 2018-02-25 NOTE — ED Notes (Addendum)
Pt dressed out in burgundy scrubs.  Pts belongings, which include 1 multi colored beaded bracelet, 1 pair cross earrings with clear stones, Shoes, pants, underwear, bra, shirt, sweater and a large bag of miscellaneous clothing, 1 pink change purse with 1 20.00 bill and 3-1.00 bills, were bagged, labelled and placed at nurses station.  Pt has a pair of gold colored wire rimmed glasses that is on her possession. Pt states she had a cell phone but we could not find it in the large bag and she stated "I must have dropped it in the car".  Pt has no belongings that need to be locked up.  Pt ambulated to room 22 and wanded by security.

## 2018-02-25 NOTE — ED Triage Notes (Signed)
Pt to ED via POV with family, states her for psych eval due to medication change from carbamazepine and taken off haloperidol.Marland Kitchen Hx of bipolar  Per family pt has been depressed and multiple outburst. Family states they have to leave at this time phone number [phone] , [phone] , [phone]. PT A&OX4, denies SI/HI or hallucinations. PT calm and cooperative

## 2018-02-25 NOTE — ED Provider Notes (Signed)
[facility] Emergency Department Provider Note  ____________________________________________   I have reviewed the triage vital signs and the nursing notes.   HISTORY  Chief Complaint Psychiatric Evaluation   History limited by: Not Limited   HPI Joyce [name] is a 72 y.o. female who presents to the emergency department today because of concern for medication issue. The patient states that she had her medications changed a few weeks ago. Feels like she is not on the dosage she should be on. She denies any si/hi. She denies any medical complaints.    Per medical record review patient has a history of bipolar.   Past Medical History:  Diagnosis Date  . Anal fissure   . Bipolar affective disorder ([place])   . CKD (chronic kidney disease)    Dr Holley Raring [name] 4  . Colon polyps   . Diverticulitis   . Diverticulitis   . Family history of adverse reaction to anesthesia    mom - PONV  . GERD (gastroesophageal reflux disease)   . Headache    migraines - none over 10 yrs  . Heart murmur   . History of hiatal hernia   . Hypertension   . Pancreatitis    Valproic acid  . Vertigo     Patient Active Problem List   Diagnosis Date Noted  . UTI (urinary tract infection) 12/16/2017  . Bipolar 1 disorder, depressed ([place]) 12/15/2017  . Palliative care encounter   . Bipolar affective disorder, current episode depressed with psychotic symptoms ([place]) 09/17/2017  . Bipolar I disorder, most recent episode (or current) manic ([place]) 09/02/2017  . Acute delirium 09/02/2017  . Altered mental status   . Acute encephalopathy 09/01/2017  . ESRD on dialysis ([place]) 04/06/2017  . Complication of vascular access for dialysis 04/06/2017  . Hematuria 12/18/2016  . Polycystic kidney 10/12/2016  . Ruptured cyst of kidney 10/03/2016  . HTN (hypertension) 10/01/2016  . Chronic kidney disease 10/01/2016  . Anemia associated with chronic renal failure 09/24/2016  . Acute kidney  insufficiency 07/28/2016  . Noninfectious diarrhea   . Benign neoplasm of cecum   . Benign neoplasm of ascending colon   . Diarrhea   . Nausea   . Gastric polyp   . Chronic constipation 12/27/2014  . Nausea with vomiting 12/27/2014  . Left sided abdominal pain 12/27/2014  . Personal history of colonic polyps 05/10/2013    Past Surgical History:  Procedure Laterality Date  . A/V FISTULAGRAM Left 04/14/2017   Procedure: A/V Fistulagram;  Surgeon: Katha Cabal, MD;  Location: [place] [facility];  Service: Cardiovascular;  Laterality: Left;  . A/V FISTULAGRAM Left 06/09/2017   Procedure: A/V FISTULAGRAM;  Surgeon: Katha Cabal, MD;  Location: [place] [facility];  Service: Cardiovascular;  Laterality: Left;  . A/V FISTULAGRAM Left 11/02/2017   Procedure: A/V FISTULAGRAM;  Surgeon: Algernon Huxley, MD;  Location: [place] [facility];  Service: Cardiovascular;  Laterality: Left;  . A/V SHUNT INTERVENTION N/A 11/02/2017   Procedure: A/V SHUNT INTERVENTION;  Surgeon: Algernon Huxley, MD;  Location: [place] [facility];  Service: Cardiovascular;  Laterality: N/A;  . ABDOMINAL HYSTERECTOMY  1990 ?  . AV FISTULA PLACEMENT  4/30  . BREAST EXCISIONAL BIOPSY Left 1994   neg surgical bx  . CHOLECYSTECTOMY  2003  . COLONOSCOPY  2014   Dr. Jamal Collin  . COLONOSCOPY WITH PROPOFOL N/A 09/24/2015   Procedure: COLONOSCOPY WITH random colon byopies.;  Surgeon: Lucilla Lame, MD;  Location:  [place];  Service: Endoscopy;  Laterality: N/A;  . ESOPHAGOGASTRODUODENOSCOPY (EGD) WITH PROPOFOL N/A 09/24/2015   Procedure: ESOPHAGOGASTRODUODENOSCOPY (EGD) ;  Surgeon: Lucilla Lame, MD;  Location: [place];  Service: Endoscopy;  Laterality: N/A;  . EYE SURGERY    . PERIPHERAL VASCULAR CATHETERIZATION N/A 05/29/2015   Procedure: A/V Shuntogram/Fistulagram;  Surgeon: Katha Cabal, MD;  Location: [place] [facility];  Service: Cardiovascular;  Laterality: N/A;  . PERIPHERAL VASCULAR  CATHETERIZATION N/A 05/29/2015   Procedure: A/V Shunt Intervention;  Surgeon: Katha Cabal, MD;  Location: [place] [facility];  Service: Cardiovascular;  Laterality: N/A;  . POLYPECTOMY  09/24/2015   Procedure: POLYPECTOMY INTESTINAL;  Surgeon: Lucilla Lame, MD;  Location: [place];  Service: Endoscopy;;  cecal polyp ascending polyp    Prior to Admission medications   Medication Sig Start Date End Date Taking? Authorizing Provider  acetaminophen (TYLENOL) 325 MG tablet Take 325 mg 2 (two) times daily as needed by mouth for moderate pain or headache.     [provider]  brimonidine (ALPHAGAN) 0.2 % ophthalmic solution Place 1 drop into both eyes 2 (two) times daily. 10/22/17   Clapacs, [name], MD  carbamazepine (TEGRETOL) 200 MG tablet Take 1 tablet (200 mg total) by mouth 3 (three) times daily. 12/21/17   [name], Herma Ard B, MD  cholecalciferol (VITAMIN D) 1000 UNITS tablet Take 1,000 Units by mouth daily.    [provider]  cyanocobalamin (,VITAMIN B-12,) 1000 MCG/ML injection Inject 1,000 mcg into the muscle every 30 (thirty) days.    [provider]  dicyclomine (BENTYL) 20 MG tablet Take 1 tablet (20 mg total) by mouth 3 (three) times daily before meals. Patient taking differently: Take 20 mg by mouth daily.  10/22/17   Clapacs, [name], MD  epoetin alfa (EPOGEN,PROCRIT) 4000 UNIT/ML injection Inject 1 mL (4,000 Units total) into the vein every Monday, Wednesday, and Friday with hemodialysis. 10/22/17   Clapacs, [name], MD  famotidine (PEPCID) 20 MG tablet TAKE 1 TABLET BY MOUTH EVERY DAY 12/31/17   Clapacs, [name], MD  haloperidol (HALDOL) 5 MG tablet TAKE 1/2 TABLET BY MOUTH IN THE MORNING AND 1 TABLET AT BEDTIME 11/16/17   [provider]  lidocaine (LIDODERM) 5 % Place 1 patch onto the skin daily. Remove & Discard patch within 12 hours or as directed by MD 10/22/17   Clapacs, [name], MD  lidocaine-prilocaine (EMLA) cream Apply 1 application every  Monday, Wednesday, and Friday topically. At dialysis 05/06/17   [provider]  LORazepam (ATIVAN) 2 MG tablet Take 1 tablet (2 mg total) by mouth at bedtime. 10/22/17   Clapacs, [name], MD  metoprolol tartrate (LOPRESSOR) 25 MG tablet Take 0.5 tablets (12.5 mg total) by mouth 2 (two) times daily. Patient taking differently: Take 25 mg by mouth 2 (two) times daily.  10/22/17   Clapacs, [name], MD  midodrine (PROAMATINE) 10 MG tablet Take 1 tablet (10 mg total) by mouth daily. 10/22/17   Clapacs, [name], MD  multivitamin (RENA-VIT) TABS tablet TAKE 1 TABLET BY MOUTH EVERYDAY AT BEDTIME 10/27/17   Clapacs, [name], MD  OLANZapine (ZYPREXA) 15 MG tablet Take 1 tablet (15 mg total) by mouth at bedtime. 12/23/17   [name], [name], MD  rOPINIRole (REQUIP) 2 MG tablet Take 1 tablet (2 mg total) by mouth at bedtime. Patient taking differently: Take 2 mg by mouth daily.  10/22/17   Clapacs, [name], MD  timolol (  TIMOPTIC) 0.5 % ophthalmic solution Place 1 drop into both eyes 2 (two) times daily. 10/22/17   Clapacs, [name], MD    Allergies Morphine and related; Indomethacin; and Pollen extract  Family History  Problem Relation Age of Onset  . Stroke Father   . Hypertension Father   . Breast cancer Other   . Colon cancer Neg Hx   . Liver disease Neg Hx     Social History Social History   Tobacco Use  . Smoking status: Never Smoker  . Smokeless tobacco: Never Used  Substance Use Topics  . Alcohol use: No    Alcohol/week: 0.0 standard drinks  . Drug use: No    Review of Systems Constitutional: No fever/chills Eyes: No visual changes. ENT: No sore throat. Cardiovascular: Denies chest pain. Respiratory: Denies shortness of breath. Gastrointestinal: No abdominal pain.  No nausea, no vomiting.  No diarrhea.   Genitourinary: Negative for dysuria. Musculoskeletal: Negative for back pain. Skin: Negative for rash. Neurological: Negative for headaches, focal weakness or  numbness.  ____________________________________________   PHYSICAL EXAM:  VITAL SIGNS: ED Triage Vitals  Enc Vitals Group     BP 02/25/18 1646 (!) 177/67     Pulse Rate 02/25/18 1646 76     Resp --      Temp 02/25/18 1646 98.3 F (36.8 C)     Temp Source 02/25/18 1646 Oral     SpO2 02/25/18 1646 96 %     Weight 02/25/18 1647 151 lb (68.5 kg)     Height 02/25/18 1647 5\' 3"  (1.6 m)     Head Circumference --      Peak Flow --      Pain Score 02/25/18 1703 0   Constitutional: Alert and oriented.  Eyes: Conjunctivae are normal.  ENT      Head: Normocephalic and atraumatic.      Nose: No congestion/rhinnorhea.      Mouth/Throat: Mucous membranes are moist.      Neck: No stridor. Hematological/Lymphatic/Immunilogical: No cervical lymphadenopathy. Cardiovascular: Normal rate, regular rhythm.  No murmurs, rubs, or gallops.  Respiratory: Normal respiratory effort without tachypnea nor retractions. Breath sounds are clear and equal bilaterally. No wheezes/rales/rhonchi. Gastrointestinal: Soft and non tender. No rebound. No guarding.  Genitourinary: Deferred Musculoskeletal: Normal range of motion in all extremities. No lower extremity edema. Neurologic:  Normal speech and language. No gross focal neurologic deficits are appreciated.  Skin:  Skin is warm, dry and intact. No rash noted. Psychiatric: Mood and affect are normal. Speech and behavior are normal. Patient exhibits appropriate insight and judgment.  ____________________________________________    LABS (pertinent positives/negatives)  CBC wbc 8.2, hgb 12.9, plt 205 UDS wnl UA not consistent with infection CMP wnl except glu 119, cr 4.40  ____________________________________________   EKG  None  ____________________________________________    RADIOLOGY  None  ____________________________________________   PROCEDURES  Procedures  ____________________________________________   INITIAL IMPRESSION /  ASSESSMENT AND PLAN / ED COURSE  Pertinent labs & imaging results that were available during my care of the patient were reviewed by me and considered in my medical decision making (see chart for details).   Patient presents to the emergency department today because of concerns for medication dosage issues.  Will have psychiatry evaluate.  Discussed with patient/family results of testing/physical exam, differential plan and return precautions.  ____________________________________________   FINAL CLINICAL IMPRESSION(S) / ED DIAGNOSES  Final diagnoses:  Medication management     Note: This dictation was prepared with Dragon dictation.  Any transcriptional errors that result from this process are unintentional     Nance Pear, MD 02/26/18 1521

## 2018-02-26 ENCOUNTER — Inpatient Hospital Stay
Admission: AD | Admit: 2018-02-26 | Discharge: 2018-03-03 | DRG: 885 | Disposition: A | Discharge status: Home / Self Care | Payer: Medicare HMO | Source: Intra-hospital | Attending: Psychiatry | Admitting: Psychiatry

## 2018-02-26 ENCOUNTER — Other Ambulatory Visit: Payer: Self-pay

## 2018-02-26 DIAGNOSIS — G2581 Restless legs syndrome: Secondary | ICD-10-CM | POA: Diagnosis present

## 2018-02-26 DIAGNOSIS — F5105 Insomnia due to other mental disorder: Secondary | ICD-10-CM | POA: Diagnosis present

## 2018-02-26 DIAGNOSIS — Z888 Allergy status to other drugs, medicaments and biological substances status: Secondary | ICD-10-CM

## 2018-02-26 DIAGNOSIS — Z9114 Patient's other noncompliance with medication regimen: Secondary | ICD-10-CM | POA: Diagnosis not present

## 2018-02-26 DIAGNOSIS — Z8249 Family history of ischemic heart disease and other diseases of the circulatory system: Secondary | ICD-10-CM

## 2018-02-26 DIAGNOSIS — F3130 Bipolar disorder, current episode depressed, mild or moderate severity, unspecified: Secondary | ICD-10-CM | POA: Diagnosis present

## 2018-02-26 DIAGNOSIS — Z9049 Acquired absence of other specified parts of digestive tract: Secondary | ICD-10-CM | POA: Diagnosis not present

## 2018-02-26 DIAGNOSIS — N2581 Secondary hyperparathyroidism of renal origin: Secondary | ICD-10-CM | POA: Diagnosis present

## 2018-02-26 DIAGNOSIS — K219 Gastro-esophageal reflux disease without esophagitis: Secondary | ICD-10-CM | POA: Diagnosis not present

## 2018-02-26 DIAGNOSIS — F0390 Unspecified dementia without behavioral disturbance: Secondary | ICD-10-CM | POA: Diagnosis not present

## 2018-02-26 DIAGNOSIS — Z992 Dependence on renal dialysis: Secondary | ICD-10-CM | POA: Diagnosis not present

## 2018-02-26 DIAGNOSIS — I120 Hypertensive chronic kidney disease with stage 5 chronic kidney disease or end stage renal disease: Secondary | ICD-10-CM | POA: Diagnosis not present

## 2018-02-26 DIAGNOSIS — Z803 Family history of malignant neoplasm of breast: Secondary | ICD-10-CM | POA: Diagnosis not present

## 2018-02-26 DIAGNOSIS — N186 End stage renal disease: Secondary | ICD-10-CM

## 2018-02-26 DIAGNOSIS — Z91048 Other nonmedicinal substance allergy status: Secondary | ICD-10-CM | POA: Diagnosis not present

## 2018-02-26 DIAGNOSIS — F312 Bipolar disorder, current episode manic severe with psychotic features: Principal | ICD-10-CM | POA: Diagnosis present

## 2018-02-26 DIAGNOSIS — Z885 Allergy status to narcotic agent status: Secondary | ICD-10-CM

## 2018-02-26 DIAGNOSIS — Z79899 Other long term (current) drug therapy: Secondary | ICD-10-CM

## 2018-02-26 DIAGNOSIS — F419 Anxiety disorder, unspecified: Secondary | ICD-10-CM | POA: Diagnosis present

## 2018-02-26 DIAGNOSIS — D631 Anemia in chronic kidney disease: Secondary | ICD-10-CM | POA: Diagnosis not present

## 2018-02-26 DIAGNOSIS — F315 Bipolar disorder, current episode depressed, severe, with psychotic features: Secondary | ICD-10-CM

## 2018-02-26 DIAGNOSIS — Q613 Polycystic kidney, unspecified: Secondary | ICD-10-CM | POA: Diagnosis not present

## 2018-02-26 DIAGNOSIS — I10 Essential (primary) hypertension: Secondary | ICD-10-CM | POA: Diagnosis present

## 2018-02-26 DIAGNOSIS — F329 Major depressive disorder, single episode, unspecified: Secondary | ICD-10-CM | POA: Diagnosis present

## 2018-02-26 DIAGNOSIS — R011 Cardiac murmur, unspecified: Secondary | ICD-10-CM | POA: Diagnosis not present

## 2018-02-26 DIAGNOSIS — F0391 Unspecified dementia with behavioral disturbance: Secondary | ICD-10-CM | POA: Diagnosis not present

## 2018-02-26 DIAGNOSIS — R45851 Suicidal ideations: Secondary | ICD-10-CM | POA: Diagnosis present

## 2018-02-26 MED ORDER — CHLORHEXIDINE GLUCONATE CLOTH 2 % EX PADS
6.0000 | MEDICATED_PAD | Freq: Every day | CUTANEOUS | Status: DC
  Filled 2018-02-26 (×2): qty 6

## 2018-02-26 MED ORDER — CARBAMAZEPINE 200 MG PO TABS
200.0000 mg | ORAL_TABLET | Freq: Three times a day (TID) | ORAL | Status: DC
  Administered 2018-02-27 – 2018-03-03 (×11): 200 mg via ORAL
  Filled 2018-02-26 (×12): qty 1

## 2018-02-26 MED ORDER — ACETAMINOPHEN 325 MG PO TABS
325.0000 mg | ORAL_TABLET | Freq: Two times a day (BID) | ORAL | Status: DC | PRN
  Administered 2018-02-26: 325 mg via ORAL
  Filled 2018-02-26: qty 1

## 2018-02-26 MED ORDER — OLANZAPINE 5 MG PO TABS: 2.5000 mg | ORAL_TABLET | Freq: Every day | ORAL | Status: DC

## 2018-02-26 MED ORDER — MAGNESIUM HYDROXIDE 400 MG/5ML PO SUSP: 30.0000 mL | Freq: Every day | ORAL | Status: DC | PRN

## 2018-02-26 MED ORDER — RENA-VITE PO TABS
1.0000 | ORAL_TABLET | Freq: Every day | ORAL | Status: DC
  Administered 2018-02-26: 1 via ORAL
  Filled 2018-02-26: qty 1

## 2018-02-26 MED ORDER — FAMOTIDINE 20 MG PO TABS
20.0000 mg | ORAL_TABLET | Freq: Every day | ORAL | Status: DC
Start: 2018-02-27 — End: 2018-03-03
  Administered 2018-02-27 – 2018-03-02 (×4): 20 mg via ORAL
  Filled 2018-02-26 (×5): qty 1

## 2018-02-26 MED ORDER — ROPINIROLE HCL 1 MG PO TABS
2.0000 mg | ORAL_TABLET | Freq: Every day | ORAL | Status: DC
  Administered 2018-02-26 – 2018-03-02 (×5): 2 mg via ORAL
  Filled 2018-02-26 (×6): qty 2

## 2018-02-26 MED ORDER — FAMOTIDINE 20 MG PO TABS
20.0000 mg | ORAL_TABLET | Freq: Every day | ORAL | Status: DC
  Administered 2018-02-26: 20 mg via ORAL
  Filled 2018-02-26: qty 1

## 2018-02-26 MED ORDER — TIMOLOL MALEATE 0.5 % OP SOLN
1.0000 [drp] | Freq: Two times a day (BID) | OPHTHALMIC | Status: DC
  Administered 2018-02-26 – 2018-03-03 (×10): 1 [drp] via OPHTHALMIC
  Filled 2018-02-26: qty 5

## 2018-02-26 MED ORDER — ROPINIROLE HCL 1 MG PO TABS
2.0000 mg | ORAL_TABLET | Freq: Every day | ORAL | Status: DC
  Administered 2018-02-26: 2 mg via ORAL
  Filled 2018-02-26 (×2): qty 2

## 2018-02-26 MED ORDER — BRIMONIDINE TARTRATE 0.2 % OP SOLN
1.0000 [drp] | Freq: Two times a day (BID) | OPHTHALMIC | Status: DC
  Administered 2018-02-26 – 2018-03-03 (×10): 1 [drp] via OPHTHALMIC
  Filled 2018-02-26: qty 5

## 2018-02-26 MED ORDER — HALOPERIDOL 1 MG PO TABS
2.5000 mg | ORAL_TABLET | ORAL | Status: DC
  Administered 2018-03-03: 2.5 mg via ORAL

## 2018-02-26 MED ORDER — DICYCLOMINE HCL 20 MG PO TABS: 20.0000 mg | ORAL_TABLET | Freq: Three times a day (TID) | ORAL | Status: DC | PRN

## 2018-02-26 MED ORDER — LIDOCAINE-PRILOCAINE 2.5-2.5 % EX CREA
1.0000 "application " | TOPICAL_CREAM | CUTANEOUS | Status: DC
  Administered 2018-03-01: 1 via TOPICAL
  Filled 2018-02-26: qty 5

## 2018-02-26 MED ORDER — HALOPERIDOL 5 MG PO TABS
5.0000 mg | ORAL_TABLET | Freq: Every day | ORAL | Status: DC
  Administered 2018-02-26 – 2018-03-02 (×5): 5 mg via ORAL
  Filled 2018-02-26 (×6): qty 1

## 2018-02-26 MED ORDER — BRIMONIDINE TARTRATE 0.2 % OP SOLN
1.0000 [drp] | Freq: Two times a day (BID) | OPHTHALMIC | Status: DC
  Administered 2018-02-26: 1 [drp] via OPHTHALMIC
  Filled 2018-02-26: qty 5

## 2018-02-26 MED ORDER — METOPROLOL TARTRATE 25 MG PO TABS
25.0000 mg | ORAL_TABLET | Freq: Two times a day (BID) | ORAL | Status: DC
  Administered 2018-02-27 – 2018-03-02 (×6): 25 mg via ORAL
  Filled 2018-02-26 (×10): qty 1

## 2018-02-26 MED ORDER — TIMOLOL MALEATE 0.5 % OP SOLN
1.0000 [drp] | Freq: Two times a day (BID) | OPHTHALMIC | Status: DC
  Administered 2018-02-26: 1 [drp] via OPHTHALMIC
  Filled 2018-02-26: qty 5

## 2018-02-26 MED ORDER — LIDOCAINE-PRILOCAINE 2.5-2.5 % EX CREA: 1.0000 "application " | TOPICAL_CREAM | CUTANEOUS | Status: DC

## 2018-02-26 MED ORDER — CARBAMAZEPINE 200 MG PO TABS
200.0000 mg | ORAL_TABLET | Freq: Three times a day (TID) | ORAL | Status: DC
  Administered 2018-02-26 (×2): 200 mg via ORAL
  Filled 2018-02-26 (×2): qty 1

## 2018-02-26 MED ORDER — DICYCLOMINE HCL 20 MG PO TABS
20.0000 mg | ORAL_TABLET | Freq: Three times a day (TID) | ORAL | Status: DC | PRN
  Filled 2018-02-26: qty 1

## 2018-02-26 MED ORDER — CHLORHEXIDINE GLUCONATE CLOTH 2 % EX PADS: 6.0000 | MEDICATED_PAD | Freq: Every day | CUTANEOUS | Status: DC

## 2018-02-26 MED ORDER — ACETAMINOPHEN 325 MG PO TABS: 325.0000 mg | ORAL_TABLET | Freq: Two times a day (BID) | ORAL | Status: DC | PRN

## 2018-02-26 MED ORDER — ACETAMINOPHEN 325 MG PO TABS
650.0000 mg | ORAL_TABLET | Freq: Four times a day (QID) | ORAL | Status: DC | PRN
  Administered 2018-02-27 – 2018-03-02 (×2): 650 mg via ORAL
  Filled 2018-02-26 (×2): qty 2

## 2018-02-26 MED ORDER — ALUM & MAG HYDROXIDE-SIMETH 200-200-20 MG/5ML PO SUSP: 30.0000 mL | ORAL | Status: DC | PRN

## 2018-02-26 MED ORDER — RENA-VITE PO TABS
1.0000 | ORAL_TABLET | Freq: Every day | ORAL | Status: DC
  Administered 2018-02-27 – 2018-03-02 (×4): 1 via ORAL
  Filled 2018-02-26 (×5): qty 1

## 2018-02-26 MED ORDER — TEMAZEPAM 15 MG PO CAPS
15.0000 mg | ORAL_CAPSULE | Freq: Every evening | ORAL | Status: DC | PRN
  Administered 2018-03-02: 15 mg via ORAL
  Filled 2018-02-26: qty 1

## 2018-02-26 MED ORDER — METOPROLOL TARTRATE 25 MG PO TABS
25.0000 mg | ORAL_TABLET | Freq: Two times a day (BID) | ORAL | Status: DC
  Administered 2018-02-26: 25 mg via ORAL
  Filled 2018-02-26: qty 1

## 2018-02-26 NOTE — ED Notes (Signed)
Pt off unit in dialysis

## 2018-02-26 NOTE — Consult Note (Signed)
Will admit Joyce [name] tro psychiatry.

## 2018-02-26 NOTE — Progress Notes (Signed)
[facility]  ROUNDING NOTE   Subjective:   [name] admitted to [facility] on 02/25/2018 for psych eval/med refill  Last hemodialysis was Wednesday.   Objective:  Vital signs in last 24 hours:  Temp:  [98.3 F (36.8 C)] 98.3 F (36.8 C) (08/08 2353) Pulse Rate:  [72-76] 74 (08/09 0905) Resp:  [18] 18 (08/09 0905) BP: (127-177)/(53-67) 127/57 (08/09 0905) SpO2:  [96 %-99 %] 96 % (08/09 0905) Weight:  [68.5 kg] 68.5 kg (08/08 1703)  Weight change:  Filed Weights   02/25/18 1647 02/25/18 1703  Weight: 68.5 kg 68.5 kg    Intake/Output: No intake/output data recorded.   Intake/Output this shift:  No intake/output data recorded.  Physical Exam: General: NAD,   Head: Normocephalic, atraumatic. Moist oral mucosal membranes  Eyes: Anicteric, PERRL  Neck: Supple, trachea midline  Lungs:  Clear to auscultation  Heart: Regular rate and rhythm  Abdomen:  Soft, nontender,   Extremities:  no peripheral edema.  Neurologic: Nonfocal, moving all four extremities  Skin: No lesions  Access: Left AVF    Basic Metabolic Panel: Recent Labs  Lab 02/25/18 1705  NA 141  K 3.6  CL 101  CO2 28  GLUCOSE 119*  BUN 20  CREATININE 4.40*  CALCIUM 9.4    Liver Function Tests: Recent Labs  Lab 02/25/18 1705  AST 22  ALT 15  ALKPHOS 68  BILITOT 0.6  PROT 7.1  ALBUMIN 4.2   No results for input(s): LIPASE, AMYLASE in the last 168 hours. No results for input(s): AMMONIA in the last 168 hours.  CBC: Recent Labs  Lab 02/25/18 1705  WBC 8.2  HGB 12.9  HCT 36.9  MCV 97.6  PLT 205    Cardiac Enzymes: No results for input(s): CKTOTAL, CKMB, CKMBINDEX, TROPONINI in the last 168 hours.  BNP: Invalid input(s): POCBNP  CBG: No results for input(s): GLUCAP in the last 168 hours.  Microbiology: Results for orders placed or performed during the hospital encounter of 09/01/17  Urine culture     Status: Abnormal   Collection Time: 09/01/17  4:32 PM   Result Value Ref Range Status   Specimen Description   Final    URINE, RANDOM Performed at [facility], [address] [place], [place] [address]    Special Requests   Final    NONE Performed at [facility], [address] [place], [place] [address]    Culture MULTIPLE SPECIES PRESENT, SUGGEST RECOLLECTION (A)  Final   Report Status 09/03/2017 FINAL  Final  Culture, blood (routine x 2)     Status: None   Collection Time: 09/01/17  6:05 PM  Result Value Ref Range Status   Specimen Description BLOOD RIGHT HAND  Final   Special Requests   Final    BOTTLES DRAWN AEROBIC AND ANAEROBIC Blood Culture results may not be optimal due to an excessive volume of blood received in culture bottles   Culture   Final    NO GROWTH 5 DAYS Performed at [facility], [address], [place], [place] [address]    Report Status 09/06/2017 FINAL  Final  Culture, blood (routine x 2)     Status: None   Collection Time: 09/01/17  6:34 PM  Result Value Ref Range Status   Specimen Description BLOOD RAC  Final   Special Requests   Final    BOTTLES DRAWN AEROBIC AND ANAEROBIC Blood Culture adequate volume   Culture   Final  NO GROWTH 5 DAYS Performed at [facility], [address] [place],  [address]    Report Status 09/06/2017 FINAL  Final    Coagulation Studies: No results for input(s): LABPROT, INR in the last 72 hours.  Urinalysis: Recent Labs    02/25/18 1705  COLORURINE YELLOW*  LABSPEC 1.013  PHURINE 7.0  GLUCOSEU NEGATIVE  HGBUR NEGATIVE  BILIRUBINUR NEGATIVE  KETONESUR NEGATIVE  PROTEINUR NEGATIVE  NITRITE NEGATIVE  LEUKOCYTESUR NEGATIVE      Imaging: No results found.   Medications:    . brimonidine  1 drop Both Eyes BID  . carbamazepine  200 mg Oral TID  . Chlorhexidine Gluconate Cloth  6 each Topical Q0600  . famotidine  20 mg Oral Daily  . lidocaine-prilocaine  1 application Topical Q M,W,F  . metoprolol  tartrate  25 mg Oral BID  . multivitamin  1 tablet Oral Daily  . OLANZapine  2.5 mg Oral QHS  . rOPINIRole  2 mg Oral QHS  . timolol  1 drop Both Eyes BID   acetaminophen, dicyclomine  Assessment/ Plan:  [name] is a 72 y.o. white female with end stage renal disease on hemodialysis, hypertension, bipolar disorder, GERD, polycystic kidney disease  [facility] MWF left AVF  1. End Stage Renal Disease: on hemodialysis. Today is patient's scheduled dialysis treatment. Orders prepared. Dialysis for later today.   2. Hypertension: blood pressure at goal.  - metoprolol  3. Anemia of chronic kidney disease: hemoglobin 12.9 - EPO as outpatient.    4. Secondary Hyperparathyroidism: calcium at goal. Not currently taking binders.   LOS: 0 Mirelle Biskup 8/9/201911:50 AM

## 2018-02-26 NOTE — Progress Notes (Signed)
Hd started  

## 2018-02-26 NOTE — ED Provider Notes (Signed)
-----------------------------------------   6:34 AM on 02/26/2018 -----------------------------------------   Blood pressure (!) 139/53, pulse 72, temperature 98.3 F (36.8 C), temperature source Oral, height 5\' 3"  (1.6 m), weight 68.5 kg, SpO2 99 %.  The patient had no acute events since last update.  Sleeping at this time.  Patient needs to be dialyzed today.  Disposition is pending Psychiatry/Behavioral Medicine team recommendations.     Paulette Blanch, MD 02/26/18 [phone]

## 2018-02-26 NOTE — Progress Notes (Signed)
New admit to the unit, patient is known to [place] from previous encounters and she is familiar with unit guide lines and expected behaviors , search was conducted by Alexis/Alex RN  And skin check assessment complete, skin is intact and no contraband found, safety is thought to patient, cold tray and beverages provided . Hygiene products provided, medication is given as ordered , unit and  room  Orientation complete , patient understood informations provided, denies any SI/HHI and no signs of AVH at this time no distress assessment complete.

## 2018-02-26 NOTE — Progress Notes (Signed)
LCSW was asked by psychiatrist to follow up with this patient as she has specific health and housing issues. LCSW met face to face with patient and currently has a HCPOA her son but wishes to change this back to her sister and ist cousin. ( This to be arranged by patient and chaplain at discharge.  Collected data and reviewed possible discharge plan for patient and current housing options.  Patient is to be admitted to [facility] and will continue with [facility] dialysis  LCSW consulted with psychiatrist   Enis Slipper LCSW [phone]

## 2018-02-26 NOTE — Progress Notes (Signed)
This note also relates to the following rows which could not be included: Pulse Rate - Cannot attach notes to unvalidated device data Resp - Cannot attach notes to unvalidated device data  Hd completed  

## 2018-02-26 NOTE — BH Assessment (Signed)
Patient is to be admitted to [facility] by Dr. Bary Leriche.  Attending Physician will be Dr. Bary Leriche.   Patient has been assigned to room 310, by [place] Nurse Mechele Claude.   ER staff is aware of the admission:  Sherry Ruffing, ER Dian Situ   Dr. Jimmye Norman, ER MD   Amy B., Patient's Nurse   Meredith Mody, Patient Access.
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
## 2018-02-26 NOTE — ED Notes (Signed)
Pt transported to dialysis

## 2018-02-26 NOTE — ED Notes (Signed)
Pt off unit for dialysis.

## 2018-02-26 NOTE — Clinical Social Work Note (Signed)
Clinical Social Work Assessment  Patient Details  Name: Joyce [name] MRN: [id number] Date of Birth: [birth year]  Date of referral:  02/26/18               Reason for consult:  Discharge Planning                Permission sought to share information with:  Family Supports Permission granted to share information::  Yes, Verbal Permission Granted  Name::     Son and daughter in Catering manager::  As potential referal through [employer] [place]  Relationship::     Contact Information:     Housing/Transportation Living arrangements for the past 2 months:  Single Family Home Source of Information:  Patient Patient Interpreter Needed:  None Criminal Activity/Legal Involvement Pertinent to Current Situation/Hospitalization:  No - Comment as needed Significant Relationships:  Adult Children, Siblings, Neighbor, Church, Other Family Members Lives with:  Self Do you feel safe going back to the place where you live?  Yes Need for family participation in patient care:  Yes (Comment)(As needed)  Care giving concerns:  TBD    Social Worker assessment / plan: LCSW introduced myself to patient during her dialysis treatment. She is a hemo dyalisis patient and [facility]. She reports she has been staying with her son and daughter inlaw and now wishes to return to her 14x70 foot mobile trailer home. Patient reports she has an up to date drivers licence and her own vehicle and reports she is able to drive herself to the clinic which would only be 10 minutes away from Clinic. She reports she would prefer to change her health care power of attorney back to her sister and 1st cousin. She reports she can do things independently and does take her health issues seriously. Patient reports she is competent and want to return to her home not a facility. LCSW consulted with psychiatrist and DC plans were discussed. Patient was polite and oriented and will review all medications while she remains in  BMU.  Employment status:  Retired Forensic scientist:  Medicare PT Recommendations:    Information / Referral to community resources:   TBD  Patient/Family's Response to care: Sister to be contacted and her 1st cousin  Patient/Family's Understanding of and Emotional Response to Diagnosis, Current Treatment, and Prognosis:  Patient has excellent understanding of her medical issues and plan of care.  Emotional Assessment Appearance:  Appears stated age, Well-Groomed Attitude/Demeanor/Rapport:    Affect (typically observed):  Accepting Orientation:  Oriented to Self, Oriented to Situation, Oriented to Place, Oriented to  Time Alcohol / Substance use:  Not Applicable Psych involvement (Current and /or in the community):  Yes (Comment)(ECT- Dr Weber Cooks)  Discharge Needs  Concerns to be addressed:  Denies Needs/Concerns at this time, Care Coordination Readmission within the last 30 days:  No Current discharge risk:  None Barriers to Discharge:  No Barriers Identified   Joana Reamer, LCSW 02/26/2018, 5:49 PM

## 2018-02-26 NOTE — Tx Team (Signed)
Initial Treatment Plan 02/26/2018 11:07 PM [name] [id number]    PATIENT STRESSORS: Financial difficulties Loss of independence Medication change or noncompliance   PATIENT STRENGTHS: Average or above average intelligence Capable of independent living General fund of knowledge Supportive family/friends   PATIENT IDENTIFIED PROBLEMS: Depressed/ Anxious     Dialysis MWF    Multiple Out Bust Pre-admition    Non-Compliant with her medications         DISCHARGE CRITERIA:  Improved stabilization in mood, thinking, and/or behavior Motivation to continue treatment in a less acute level of care Need for constant or close observation no longer present Reduction of life-threatening or endangering symptoms to within safe limits  PRELIMINARY DISCHARGE PLAN: Outpatient therapy Participate in family therapy Placement in alternative living arrangements Referrals indicated:  assisted living facility  PATIENT/FAMILY INVOLVEMENT: This treatment plan has been presented to and reviewed with the patient, Joyce [name], .  The patient  have been given the opportunity to ask questions and make suggestions.  Clemens Catholic, RN 02/26/2018, 11:07 PM

## 2018-02-26 NOTE — BH Assessment (Signed)
Pt referral information faxed to:  [facility]    [address] [place] [address] Phone: [phone] Fax: [place]   [address] [place] [address] Phone: [phone] Fax: [place]    [address] [place] [address] Phone: [phone] Fax: [place] [address] [place], [place] [address] [place] Fax: [phone] [facility] [address] [place], [place] [address] Phone: [phone] Fax: [place] [address] [place], [place] [address] Phone: [phone] Fax: [phone]

## 2018-02-27 DIAGNOSIS — F312 Bipolar disorder, current episode manic severe with psychotic features: Principal | ICD-10-CM

## 2018-02-27 LAB — CARBAMAZEPINE LEVEL, TOTAL: Carbamazepine Lvl: 9.6 ug/mL (ref 4.0–12.0)

## 2018-02-27 NOTE — BHH Suicide Risk Assessment (Signed)
[facility] Admission Suicide Risk Assessment   Nursing information obtained from:  Patient Demographic factors:  Age 72 or older Current Mental Status:  NA Loss Factors:  Decline in physical health Historical Factors:  NA Risk Reduction Factors:  Positive therapeutic relationship  Total Time spent with patient: 45 minutes Principal Problem: Bipolar Disorder: MRE Manic   Diagnosis:   Patient Active Problem List   Diagnosis Date Noted  . Bipolar I disorder, current or most recent episode manic, with psychotic features ([place]) [F31.2] 02/26/2018  . UTI (urinary tract infection) [N39.0] 12/16/2017  . Bipolar 1 disorder, depressed ([place]) [F31.9] 12/15/2017  . Palliative care encounter [Z51.5]   . Bipolar affective disorder, current episode depressed with psychotic symptoms ([place]) [F31.2] 09/17/2017  . Bipolar I disorder, most recent episode (or current) manic ([place]) [F31.10] 09/02/2017  . Acute delirium [R41.0] 09/02/2017  . Altered mental status [R41.82]   . Acute encephalopathy [G93.40] 09/01/2017  . ESRD on dialysis ([place]) [N18.6, Z99.2] 04/06/2017  . Complication of vascular access for dialysis [T82.9XXA] 04/06/2017  . Hematuria [R31.9] 12/18/2016  . Polycystic kidney [Q61.3] 10/12/2016  . Ruptured cyst of kidney [Q61.00] 10/03/2016  . HTN (hypertension) [I10] 10/01/2016  . Chronic kidney disease [N18.9] 10/01/2016  . Anemia associated with chronic renal failure [N18.9, D63.1] 09/24/2016  . Acute kidney insufficiency [N28.9] 07/28/2016  . Noninfectious diarrhea [K52.9]   . Benign neoplasm of cecum [D12.0]   . Benign neoplasm of ascending colon [D12.2]   . Diarrhea [R19.7]   . Nausea [R11.0]   . Gastric polyp [K31.7]   . Chronic constipation [K59.09] 12/27/2014  . Nausea with vomiting [R11.2] 12/27/2014  . Left sided abdominal pain [R10.9] 12/27/2014  . Personal history of colonic polyps [Z86.010] 05/10/2013   Subjective Data:   Joyce [name] is a 72 year old divorced Caucasian female  with a prior diagnosis of bipolar disorder, recent diagnosis dementia the patient has become more physically as well as chronic kidney disease on hemodialysis who presented to the emergency room secondary to mood instability.  The patient had been noncompliant with medications at home per her family.  The patient has become more physically aggressive with her family.  She was receiving psychotropic medications from Dr. Thurmond Butts but her daughter-in-law recently switched her to a physician in [place].  The new medications given to her were not working well and she decompensated.  Patient saw the telemetry psychiatrist in the emergency room who reported that she was calm and cooperative.  She was hyperverbal however and somewhat paranoid and delusional about her daughter-in-law.  She reported being "very happy".  She also reported insomnia but no spending sprees, gambling, hyperreligious thoughts or hypersexual behavior.  Appetite has been poor.  Patient has been lashing out at family in a paranoid way because she was told that she cannot return home.  She frequently threatens to take a taxi back home from dialysis.  She has also been threatening not to go to dialysis which she has been compliant with in the past.  The patient herself denies feeling depressed and denies any feelings of hopelessness or anhedonia.  She wants to be able to live with her son and daughter-in-law not go to a nursing facility or group home.  She denies any current active or passive suicidal thoughts.  She denies any auditory or visual hallucinations.  She has little insight into paranoid thoughts.  No history of any heavy alcohol use or illicit drug use.   Continued Clinical Symptoms:  Alcohol Use Disorder Identification Test  Final Score (AUDIT): 2 The "Alcohol Use Disorders Identification Test", Guidelines for Use in Primary Care, Second Edition.  World Pharmacologist [facility]). Score between 0-7:  no or low risk or alcohol related  problems. Score between 8-15:  moderate risk of alcohol related problems. Score between 16-19:  high risk of alcohol related problems. Score 20 or above:  warrants further diagnostic evaluation for alcohol dependence and treatment.   CLINICAL FACTORS:   Bipolar Disorder: Manic   Musculoskeletal: Strength & Muscle Tone: within normal limits Gait & Station: normal Patient leans: N/A  Psychiatric Specialty Exam: Physical Exam: See H+P  ROS: See H+P  Blood pressure (!) 151/62, pulse 91, temperature 97.6 F (36.4 C), temperature source Oral, resp. rate 18, height 5\' 3"  (1.6 m), weight 65.3 kg, SpO2 97 %.Body mass index is 25.51 kg/m.  MSE: See H+P                                                        COGNITIVE FEATURES THAT CONTRIBUTE TO RISK:  Mania    SUICIDE RISK:   Minimal: No identifiable suicidal ideation.  Patients presenting with no risk factors but with morbid ruminations; may be classified as minimal risk based on the severity of the depressive symptoms. She denies any access to guns  PLAN OF CARE:    Joyce [name] is a72 y/o Caucasian female with history of bipolar disorder and prior ECT treatment who was admitted to psychiatry after she presented with manic symptoms at home in the context of being noncompliant with medications.  She will be admitted to inpatient psychiatry for medication management, safety and stabilization. The patient was admitted to the hospital and wil be restarted on psychotropic medications    Bipolar disorder, most recent episode manic - She will restart Tegretol 200 mg p.o. 3 times daily.  Baseline Tegretol level was not checked in the emergency room and will check as soon as possible today.  -We we will continue Haldol 5 mg p.o. nightly and 2.5 mg prior to hemodialysis on M/W/F -We will continue temazepam 15 mg p.o. nightly for insomnia - Will check EKG to R/O QTc Prolongation  CKD on hemodialysis -Patient will  continue hemodialysis M/W/F -Creatinine of 4.4 - Continue Metoprolol 25mg  po BID - Appreciate Dr Abigail Butts from nephrology following the patent   Restless Leg Syndrome: -Continue Requip 2mg  po nightly  Continue opthalmic eye drops  Disposition:  -We will need to have further discussions with the patient's son who is her legal guardian and daughter-in-law with regards to the patient returning home.  She may need placement -The patient is trying to return to see Dr. Thurmond Butts for psychotropic medication management.    I certify that inpatient services furnished can reasonably be expected to improve the patient's condition.   Chauncey Mann, MD 02/27/2018, 1:14 PM

## 2018-02-27 NOTE — BHH Counselor (Signed)
Adult Comprehensive Assessment  Patient ID: Joyce [name], female   DOB: Nov 27, 1945, 72 y.o.   MRN: [id number]  Information Source: Information source: Patient(Also review of PSA completed on 12/15/17)  Current Stressors:  Patient states their primary concerns and needs for treatment are: "this time I wanted a different medication" Patient states their goals for this hospitilization and ongoing recovery are: "feel better by changing my medications" Educational / Learning stressors: None noted Employment / Job issues: Pt does not currently work.  She stopped working in January 2019. Family Relationships: Pt is very close with her son, daughter-in-law, sister, grandchildren Museum/gallery curator / Lack of resources (include bankruptcy): Pt shared that although she receives social security benefits, it is not enough to pay all her bills like the hospital bills that were incurred in March 2019 and this current hospitalization. Housing / Lack of housing: Housing is stable Physical health (include injuries & life threatening diseases): Pt has ESRD (Stage 5).  She currently received dialysis 3xweek Social relationships: Pt shared that her social relationships are limited and mostly includes her family Substance abuse: None noted Bereavement / Loss: No current bereavement issues.    Living/Environment/Situation:  Living Arrangements: Children(Pt lives in the home of her son and daughter-in-law) Living conditions (as described by patient or guardian): "It is good.  My son and daughter-in-law take really good care of me". Who else lives in the home?: No one How long has patient lived in current situation?: since April 2019 What is atmosphere in current home: Comfortable, Quarry manager, Supportive  Family History:  Marital status: Divorced Divorced, when?: 1982 What types of issues is patient dealing with in the relationship?: None Additional relationship information: None Are you sexually active?: No What  is your sexual orientation?: Heterosexual Has your sexual activity been affected by drugs, alcohol, medication, or emotional stress?: No Does patient have children?: Yes How many children?: 1 How is patient's relationship with their children?: Pt is very close with her only son.  She is currently living with him and his wife.    Childhood History:  By whom was/is the patient raised?: Both parents Additional childhood history information: None Description of patient's relationship with caregiver when they were a child: "fanastic" Patient's description of current relationship with people who raised him/her: Pt's mother is 60 yo and her father is deceased How were you disciplined when you got in trouble as a child/adolescent?: "a loving correction" Does patient have siblings?: Yes Number of Siblings: 4 Description of patient's current relationship with siblings: 3 brothers are deceased.  She was close to all her brothers.  She has 1 sister whom she has a good relationship with.  Her sister is her Press photographer Did patient suffer any verbal/emotional/physical/sexual abuse as a child?: No Did patient suffer from severe childhood neglect?: No Has patient ever been sexually abused/assaulted/raped as an adolescent or adult?: No Was the patient ever a victim of a crime or a disaster?: No Witnessed domestic violence?: No Has patient been effected by domestic violence as an adult?: No  Education:  Highest grade of school patient has completed: 82 (some college) Currently a Ship broker?: No Learning disability?: No  Employment/Work Situation:   Employment situation: Retired Archivist job has been impacted by current illness: No What is the longest time patient has a held a job?: 30 years Where was the patient employed at that time?: [employer] Did You Receive Any Psychiatric Treatment/Services While in [employer]?: No Are There Guns or Other Weapons in  Your Home?:  No Are These Weapons Safely Secured?: (No reported weapons in the home.)  Financial Resources:   Financial resources: Medicare(Pt receives social security and retirement benefits) Does patient have a Programmer, applications or guardian?: No  Alcohol/Substance Abuse:   What has been your use of drugs/alcohol within the last 12 months?: None If attempted suicide, did drugs/alcohol play a role in this?: No Alcohol/Substance Abuse Treatment Hx: Denies past history If yes, describe treatment: n/a Has alcohol/substance abuse ever caused legal problems?: No  Social Support System:   Pensions consultant Support System: Good Describe Community Support System: Pt's primary community system are family members to include her sister and cousin Type of faith/religion: Baptist How does patient's faith help to cope with current illness?: "pray and go to church"  Leisure/Recreation:   Leisure and Hobbies: singing, reading, spending time with grandchildren (have 3 grands and 1 great-grand)  Strengths/Needs:   What is the patient's perception of their strengths?: "cleaning the house, keeping things organized" Patient states they can use these personal strengths during their treatment to contribute to their recovery: "to stay busy and active" Patient states these barriers may affect/interfere with their treatment: "having all these UTIs which often makes me crazy" Patient states these barriers may affect their return to the community: "Not now as everything has cleared up" Other important information patient would like considered in planning for their treatment: None noted  Discharge Plan:   Currently receiving community mental health services: Yes (From Whom)(Dr. Lew Dawes, MD) Patient states concerns and preferences for aftercare planning are: "Continue working with Dr. Thurmond Butts.  Continues are my medicines may stop working and I will be right back here" Patient states they will know when they  are safe and ready for discharge when: "when I feel better because I want to be more independent" Does patient have access to transportation?: Yes Does patient have financial barriers related to discharge medications?: No Patient description of barriers related to discharge medications: None noted Will patient be returning to same living situation after discharge?: No, pt states that she wants to go back to her own place    Summary/Recommendations:   Patient is a 72 year old female admitted involuntarily and diagnosed with Bipolar I disorder, current or most recent episode manic, with psychotic features. Patient was recently diagnosed with dementia. The patient had been noncompliant with medications at home per her family.  Patient was recently switched to different psychotropic medications and it was reported that the medications were not working well, and she decompensated. The patient has become more physically aggressive with her family. Patient will benefit from crisis stabilization, medication evaluation, group therapy and psychoeducation. In addition to case management for discharge planning. At discharge it is recommended that patient adhere to the established discharge plan and continue treatment.     Joyce [name]  CUEBAS-COLON. 02/27/2018

## 2018-02-27 NOTE — Progress Notes (Signed)
[facility]  ROUNDING NOTE   Subjective:   Patient resting comfortably this morning.   States she had some overnight cramping.   Hemodialysis treatment yesterday. Tolerated treatment well. UF of 1 liter  Objective:  Vital signs in last 24 hours:  Temp:  [97.6 F (36.4 C)-98.1 F (36.7 C)] 97.6 F (36.4 C) (08/10 0604) Pulse Rate:  [58-91] 91 (08/10 0604) Resp:  [14-22] 18 (08/10 0604) BP: (122-151)/(52-105) 151/62 (08/10 0604) SpO2:  [97 %-99 %] 97 % (08/10 0604) Weight:  [65.3 kg] 65.3 kg (08/09 2051)  Weight change:  Filed Weights   02/26/18 2051  Weight: 65.3 kg    Intake/Output: No intake/output data recorded.   Intake/Output this shift:  Total I/O In: 480 [P.O.:480] Out: -   Physical Exam: General: NAD,   Head: Normocephalic, atraumatic. Moist oral mucosal membranes  Eyes: Anicteric, PERRL  Neck: Supple, trachea midline  Lungs:  Clear to auscultation  Heart: Regular rate and rhythm  Abdomen:  Soft, nontender,   Extremities:  no peripheral edema.  Neurologic: Nonfocal, moving all four extremities  Skin: No lesions  Access: Left AVF    Basic Metabolic Panel: Recent Labs  Lab 02/25/18 1705  NA 141  K 3.6  CL 101  CO2 28  GLUCOSE 119*  BUN 20  CREATININE 4.40*  CALCIUM 9.4    Liver Function Tests: Recent Labs  Lab 02/25/18 1705  AST 22  ALT 15  ALKPHOS 68  BILITOT 0.6  PROT 7.1  ALBUMIN 4.2   No results for input(s): LIPASE, AMYLASE in the last 168 hours. No results for input(s): AMMONIA in the last 168 hours.  CBC: Recent Labs  Lab 02/25/18 1705  WBC 8.2  HGB 12.9  HCT 36.9  MCV 97.6  PLT 205    Cardiac Enzymes: No results for input(s): CKTOTAL, CKMB, CKMBINDEX, TROPONINI in the last 168 hours.  BNP: Invalid input(s): POCBNP  CBG: No results for input(s): GLUCAP in the last 168 hours.  Microbiology: Results for orders placed or performed during the hospital encounter of 09/01/17  Urine culture      Status: Abnormal   Collection Time: 09/01/17  4:32 PM  Result Value Ref Range Status   Specimen Description   Final    URINE, RANDOM Performed at [facility], [address], [place], [place] [address]    Special Requests   Final    NONE Performed at [facility], [address] [place], [place] [address]    Culture MULTIPLE SPECIES PRESENT, SUGGEST RECOLLECTION (A)  Final   Report Status 09/03/2017 FINAL  Final  Culture, blood (routine x 2)     Status: None   Collection Time: 09/01/17  6:05 PM  Result Value Ref Range Status   Specimen Description BLOOD RIGHT HAND  Final   Special Requests   Final    BOTTLES DRAWN AEROBIC AND ANAEROBIC Blood Culture results may not be optimal due to an excessive volume of blood received in culture bottles   Culture   Final    NO GROWTH 5 DAYS Performed at [facility], [address] [place], [place] [address]    Report Status 09/06/2017 FINAL  Final  Culture, blood (routine x 2)     Status: None   Collection Time: 09/01/17  6:34 PM  Result Value Ref Range Status   Specimen Description BLOOD RAC  Final   Special Requests   Final    BOTTLES DRAWN AEROBIC AND ANAEROBIC Blood Culture  adequate volume   Culture   Final    NO GROWTH 5 DAYS Performed at [facility], [address] [place], [place] [address]    Report Status 09/06/2017 FINAL  Final    Coagulation Studies: No results for input(s): LABPROT, INR in the last 72 hours.  Urinalysis: Recent Labs    02/25/18 1705  COLORURINE YELLOW*  LABSPEC 1.013  PHURINE 7.0  GLUCOSEU NEGATIVE  HGBUR NEGATIVE  BILIRUBINUR NEGATIVE  KETONESUR NEGATIVE  PROTEINUR NEGATIVE  NITRITE NEGATIVE  LEUKOCYTESUR NEGATIVE      Imaging: No results found.   Medications:    . brimonidine  1 drop Both Eyes BID  . carbamazepine  200 mg Oral TID  . Chlorhexidine Gluconate Cloth  6 each Topical Q0600  . famotidine  20 mg Oral Daily  . [START ON  03/01/2018] haloperidol  2.5 mg Oral Q M,W,F-HD  . haloperidol  5 mg Oral QHS  . [START ON 03/01/2018] lidocaine-prilocaine  1 application Topical Q M,W,F  . metoprolol tartrate  25 mg Oral BID  . multivitamin  1 tablet Oral Daily  . rOPINIRole  2 mg Oral QHS  . timolol  1 drop Both Eyes BID   acetaminophen, alum & mag hydroxide-simeth, dicyclomine, magnesium hydroxide, temazepam  Assessment/ Plan:  [name] is a 72 y.o. white female with end stage renal disease on hemodialysis, hypertension, bipolar disorder, GERD, polycystic kidney disease  [facility] MWF left AVF  1. End Stage Renal Disease: hemodialysis treatment yesterday. Tolerated well but some cramping post treatment - Next scheduled hemodialysis treatment for Monday.   2. Hypertension: blood pressure at goal.  - metoprolol  3. Anemia of chronic kidney disease: hemoglobin 12.9 - EPO as outpatient.    4. Secondary Hyperparathyroidism: calcium at goal. Not currently taking binders. - check phosphorus on next lab draw.    LOS: 1 Joyce [name] 8/10/20199:45 AM

## 2018-02-27 NOTE — Plan of Care (Signed)
Patient  alert and oriented to self, place and situation. Denies SI, HI, AVH and pain, states, "No not at this very moment." Calm and cooperative at this time. Emotional support offered. Encouraged pt to voice concerns. Patient up in the milieu for meals and medications with minimal peer interaction noted. Dialysis MD in this morning for assessment. Dialysis to begin on Monday. Milieu remains safe with q 15 minute safety checks. AV fistula to L) atm, + for thrill and bruit.

## 2018-02-27 NOTE — B[facility] Group Notes (Signed)
LCSW Group Therapy Note   02/27/2018 1:15pm   Type of Therapy and Topic:  Group Therapy:  Trust and Honesty  Participation Level:  Active  Description of Group:    In this group patients will be asked to explore the value of being honest.  Patients will be guided to discuss their thoughts, feelings, and behaviors related to honesty and trusting in others. Patients will process together how trust and honesty relate to forming relationships with peers, family members, and self. Each patient will be challenged to identify and express feelings of being vulnerable. Patients will discuss reasons why people are dishonest and identify alternative outcomes if one was truthful (to self or others). This group will be process-oriented, with patients participating in exploration of their own experiences, giving and receiving support, and processing challenge from other group members.   Therapeutic Goals: 1. Patient will identify why honesty is important to relationships and how honesty overall affects relationships.  2. Patient will identify a situation where they lied or were lied too and the  feelings, thought process, and behaviors surrounding the situation 3. Patient will identify the meaning of being vulnerable, how that feels, and how that correlates to being honest with self and others. 4. Patient will identify situations where they could have told the truth, but instead lied and explain reasons of dishonesty.   Summary of Patient Progress The patient scored her mood at a 10 (10 best.) The patient was able to explore the value of being honest.  Patient discussed thoughts, feelings, and behaviors related to honesty and trusting in others. The patient processed together with other group members how trust and honesty relate to forming relationships with peers, family members, and self. Pt actively and appropriately engaged in the group. Patient was able to provide support and validation to other group  members. Patient practiced active listening when interacting with the facilitator and other group members.   Therapeutic Modalities:   Cognitive Behavioral Therapy Solution Focused Therapy Motivational Interviewing Brief Therapy  Amiel Sharrow  CUEBAS-COLON, LCSW 02/27/2018 12:43 PM.

## 2018-02-27 NOTE — Progress Notes (Signed)
Patient ID: Joyce [name], female   DOB: 1946-02-27, 72 y.o.   MRN: [id number]   CSW contacted [facility] at [phone] and spoke with Claiborne Billings (24-hour nurse line) and provided information about recent pt's admission to [facility].    Cheree Ditto, LCSWA 02/27/2018 3:10PM

## 2018-02-27 NOTE — H&P (Addendum)
Psychiatric Admission Assessment Adult  Patient Identification: Joyce [name] MRN:  [id number] Date of Evaluation:  02/27/2018 Chief Complaint:  DEPRESSION Principal Diagnosis: Bipolar Disorder: MRE Manic   Diagnosis:   Patient Active Problem List   Diagnosis Date Noted  . Bipolar I disorder, current or most recent episode manic, with psychotic features ([place]) [F31.2] 02/26/2018  . UTI (urinary tract infection) [N39.0] 12/16/2017  . Bipolar 1 disorder, depressed ([place]) [F31.9] 12/15/2017  . Palliative care encounter [Z51.5]   . Bipolar affective disorder, current episode depressed with psychotic symptoms ([place]) [F31.2] 09/17/2017  . Bipolar I disorder, most recent episode (or current) manic ([place]) [F31.10] 09/02/2017  . Acute delirium [R41.0] 09/02/2017  . Altered mental status [R41.82]   . Acute encephalopathy [G93.40] 09/01/2017  . ESRD on dialysis ([place]) [N18.6, Z99.2] 04/06/2017  . Complication of vascular access for dialysis [T82.9XXA] 04/06/2017  . Hematuria [R31.9] 12/18/2016  . Polycystic kidney [Q61.3] 10/12/2016  . Ruptured cyst of kidney [Q61.00] 10/03/2016  . HTN (hypertension) [I10] 10/01/2016  . Chronic kidney disease [N18.9] 10/01/2016  . Anemia associated with chronic renal failure [N18.9, D63.1] 09/24/2016  . Acute kidney insufficiency [N28.9] 07/28/2016  . Noninfectious diarrhea [K52.9]   . Benign neoplasm of cecum [D12.0]   . Benign neoplasm of ascending colon [D12.2]   . Diarrhea [R19.7]   . Nausea [R11.0]   . Gastric polyp [K31.7]   . Chronic constipation [K59.09] 12/27/2014  . Nausea with vomiting [R11.2] 12/27/2014  . Left sided abdominal pain [R10.9] 12/27/2014  . Personal history of colonic polyps [Z86.010] 05/10/2013   History of Present Illness:    Joyce [name] is a 72 year old divorced Caucasian female with a prior diagnosis of bipolar disorder, recent diagnosis dementia the patient has become more physically as well as chronic kidney disease on  hemodialysis who presented to the emergency room secondary to mood instability.  The patient had been noncompliant with medications at home per her family.  The patient has become more physically aggressive with her family.  She was receiving psychotropic medications from Dr. Thurmond Butts but her daughter-in-law recently switched her to a physician in [place].  The new medications given to her were not working well and she decompensated.  Patient saw the telemetry psychiatrist in the emergency room who reported that she was calm and cooperative.  She was hyperverbal however and somewhat paranoid and delusional about her daughter-in-law.  She reported being "very happy".  She also reported insomnia but no spending sprees, gambling, hyperreligious thoughts or hypersexual behavior.  Appetite has been poor.  Patient has been lashing out at family in a paranoid way because she was told that she cannot return home.  She frequently threatens to take a taxi back home from dialysis.  She has also been threatening not to go to dialysis which she has been compliant with in the past.  The patient herself denies feeling depressed and denies any feelings of hopelessness or anhedonia. ER consult indicated some passive suicidal thoughts but the patient denies any suicidal ideation after admission.  She wants to be able to live with her son and daughter-in-law not go to a nursing facility or group home.  She denies any current active or passive suicidal thoughts.  She denies any auditory or visual hallucinations.  She has little insight into paranoid thoughts.  No history of any heavy alcohol use or illicit drug use.   Past psychiatric history. Long history of bipolar with several hospitalization sand many medication trials. She has been in the  care of Dr. Lanetta Inch in the past and has had ECT.  She has a history of one overdose attempt in the past over 20 years ago.   Family psychiatric history: None   Social history. The  patient was born and raised in person [place] by both her biological parents.  She graduated from high school and did some college.  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  The patient divorced in 31 and has 1 son and 1 daughter-in-law.  She currently lives with her son and daughter-in-law in [place].  Substance Abuse History: history of any heavy alcohol use or illicit drug use   Legal history She denies an+y prior arrest or incarcerations.    Associated Signs/Symptoms: Depression Symptoms:  none (Hypo) Manic Symptoms:  Impulsivity, Labiality of Mood, Anxiety Symptoms:  Anxiety present Psychotic Symptoms:  Paranoia, PTSD Symptoms: Negative Total Time spent with patient: 45 minutes  Is the patient at risk to self? Yes.    Has the patient been a risk to self in the past 6 months? Yes.    Has the patient been a risk to self within the distant past? Yes.    Is the patient a risk to others? No.  Has the patient been a risk to others in the past 6 months? No.  Has the patient been a risk to others within the distant past? No.   Prior Inpatient Therapy:  Yes Prior Outpatient Therapy:  Yes  Alcohol Screening: 1. How often do you have a drink containing alcohol?: Never 2. How many drinks containing alcohol do you have on a typical day when you are drinking?: 1 or 2 3. How often do you have six or more drinks on one occasion?: Less than monthly AUDIT-C Score: 1 4. How often during the last year have you found that you were not able to stop drinking once you had started?: Never 5. How often during the last year have you failed to do what was normally expected from you becasue of drinking?: Never 6. How often during the last year have you needed a first drink in the morning to get yourself going after a heavy drinking session?: Less than monthly 7. How often during the last year have you had a feeling of guilt of remorse after drinking?: Never 8. How often  during the last year have you been unable to remember what happened the night before because you had been drinking?: Never 9. Have you or someone else been injured as a result of your drinking?: No 10. Has a relative or friend or a doctor or another health worker been concerned about your drinking or suggested you cut down?: No Alcohol Use Disorder Identification Test Final Score (AUDIT): 2 Intervention/Follow-up: Alcohol Education Substance Abuse History in the last 12 months:  No. Consequences of Substance Abuse: NA Previous Psychotropic Medications: Yes  Psychological Evaluations: Yes  Past Medical History:  Past Medical History:  Diagnosis Date  . Anal fissure   . Bipolar affective disorder ([place])   . CKD (chronic kidney disease)    Dr Holley Raring [name] 4  . Colon polyps   . Diverticulitis   . Diverticulitis   . Family history of adverse reaction to anesthesia    mom - PONV  . GERD (gastroesophageal reflux disease)   . Headache    migraines - none over 10 yrs  . Heart murmur   . History of hiatal hernia   . Hypertension   .  Pancreatitis    Valproic acid  . Vertigo     Past Surgical History:  Procedure Laterality Date  . A/V FISTULAGRAM Left 04/14/2017   Procedure: A/V Fistulagram;  Surgeon: Katha Cabal, MD;  Location: [facility];  Service: Cardiovascular;  Laterality: Left;  . A/V FISTULAGRAM Left 06/09/2017   Procedure: A/V FISTULAGRAM;  Surgeon: Katha Cabal, MD;  Location: [facility];  Service: Cardiovascular;  Laterality: Left;  . A/V FISTULAGRAM Left 11/02/2017   Procedure: A/V FISTULAGRAM;  Surgeon: Algernon Huxley, MD;  Location: [facility];  Service: Cardiovascular;  Laterality: Left;  . A/V SHUNT INTERVENTION N/A 11/02/2017   Procedure: A/V SHUNT INTERVENTION;  Surgeon: Algernon Huxley, MD;  Location: [facility];  Service: Cardiovascular;  Laterality: N/A;  . ABDOMINAL HYSTERECTOMY  1990 ?  . AV FISTULA PLACEMENT  4/30  .  BREAST EXCISIONAL BIOPSY Left 1994   neg surgical bx  . CHOLECYSTECTOMY  2003  . COLONOSCOPY  2014   Dr. Jamal Collin  . COLONOSCOPY WITH PROPOFOL N/A 09/24/2015   Procedure: COLONOSCOPY WITH random colon byopies.;  Surgeon: Lucilla Lame, MD;  Location: [facility];  Service: Endoscopy;  Laterality: N/A;  . ESOPHAGOGASTRODUODENOSCOPY (EGD) WITH PROPOFOL N/A 09/24/2015   Procedure: ESOPHAGOGASTRODUODENOSCOPY (EGD) ;  Surgeon: Lucilla Lame, MD;  Location: [facility];  Service: Endoscopy;  Laterality: N/A;  . EYE SURGERY    . PERIPHERAL VASCULAR CATHETERIZATION N/A 05/29/2015   Procedure: A/V Shuntogram/Fistulagram;  Surgeon: Katha Cabal, MD;  Location: [facility];  Service: Cardiovascular;  Laterality: N/A;  . PERIPHERAL VASCULAR CATHETERIZATION N/A 05/29/2015   Procedure: A/V Shunt Intervention;  Surgeon: Katha Cabal, MD;  Location: [facility];  Service: Cardiovascular;  Laterality: N/A;  . POLYPECTOMY  09/24/2015   Procedure: POLYPECTOMY INTESTINAL;  Surgeon: Lucilla Lame, MD;  Location: [facility];  Service: Endoscopy;;  cecal polyp ascending polyp   Family History:  Family History  Problem Relation Age of Onset  . Stroke Father   . Hypertension Father   . Breast cancer Other   . Colon cancer Neg Hx   . Liver disease Neg Hx     Tobacco Screening: Have you used any form of tobacco in the last 30 days? (Cigarettes, Smokeless Tobacco, Cigars, and/or Pipes): No Social History:  Social History   Substance and Sexual Activity  Alcohol Use No  . Alcohol/week: 0.0 standard drinks     Social History   Substance and Sexual Activity  Drug Use No            Allergies:   Allergies  Allergen Reactions  . Morphine And Related Shortness Of Breath    Pt reports chest pain and difficulty breathing.  . Indomethacin Hives  . Pollen Extract Other (See Comments)    Sinus problems and HA   Lab Results:  Results for orders placed or performed  during the hospital encounter of 02/25/18 (from the past 48 hour(s))  Comprehensive metabolic panel     Status: Abnormal   Collection Time: 02/25/18  5:05 PM  Result Value Ref Range   Sodium 141 135 - 145 mmol/L   Potassium 3.6 3.5 - 5.1 mmol/L   Chloride 101 98 - 111 mmol/L   CO2 28 22 - 32 mmol/L   Glucose, Bld 119 (H) 70 - 99 mg/dL   BUN 20 8 - 23 mg/dL   Creatinine, Ser 4.40 (H) 0.44 - 1.00 mg/dL  Calcium 9.4 8.9 - 10.3 mg/dL   Total Protein 7.1 6.5 - 8.1 g/dL   Albumin 4.2 3.5 - 5.0 g/dL   AST 22 15 - 41 U/L   ALT 15 0 - 44 U/L   Alkaline Phosphatase 68 38 - 126 U/L   Total Bilirubin 0.6 0.3 - 1.2 mg/dL   GFR calc non Af Amer 9 (L) >60 mL/min   GFR calc Af Amer 11 (L) >60 mL/min    Comment: (NOTE) The eGFR has been calculated using the CKD EPI equation. This calculation has not been validated in all clinical situations. eGFR's persistently <60 mL/min signify possible Chronic Kidney Disease.    Anion gap 12 5 - 15    Comment: Performed at [facility], [address] [place], [place] [address]  Ethanol     Status: None   Collection Time: 02/25/18  5:05 PM  Result Value Ref Range   Alcohol, Ethyl (B) <10 <10 mg/dL    Comment: (NOTE) Lowest detectable limit for serum alcohol is 10 mg/dL. For medical purposes only. Performed at [facility], [address] [place], [place] [address]   cbc     Status: Abnormal   Collection Time: 02/25/18  5:05 PM  Result Value Ref Range   WBC 8.2 3.6 - 11.0 K/uL   RBC 3.78 (L) 3.80 - 5.20 MIL/uL   Hemoglobin 12.9 12.0 - 16.0 g/dL   HCT 36.9 35.0 - 47.0 %   MCV 97.6 80.0 - 100.0 fL   MCH 34.0 26.0 - 34.0 pg   MCHC 34.9 32.0 - 36.0 g/dL   RDW 13.8 11.5 - 14.5 %   Platelets 205 150 - 440 K/uL    Comment: Performed at [facility], [address] [place], [place] [address]  Urinalysis, Complete w Microscopic     Status: Abnormal   Collection Time: 02/25/18  5:05 PM  Result Value Ref Range   Color,  Urine YELLOW (A) YELLOW   APPearance CLEAR (A) CLEAR   Specific Gravity, Urine 1.013 1.005 - 1.030   pH 7.0 5.0 - 8.0   Glucose, UA NEGATIVE NEGATIVE mg/dL   Hgb urine dipstick NEGATIVE NEGATIVE   Bilirubin Urine NEGATIVE NEGATIVE   Ketones, ur NEGATIVE NEGATIVE mg/dL   Protein, ur NEGATIVE NEGATIVE mg/dL   Nitrite NEGATIVE NEGATIVE   Leukocytes, UA NEGATIVE NEGATIVE   RBC / HPF 0-5 0 - 5 RBC/hpf   WBC, UA 0-5 0 - 5 WBC/hpf   Bacteria, UA NONE SEEN NONE SEEN   Squamous Epithelial / LPF 0-5 0 - 5   Non Squamous Epithelial PRESENT (A) NONE SEEN    Comment: Performed at [facility], [address] [place], [place] [address]  Urine Drug Screen, Qualitative     Status: Abnormal   Collection Time: 02/25/18  5:11 PM  Result Value Ref Range   Tricyclic, Ur Screen NONE DETECTED NONE DETECTED   Amphetamines, Ur Screen NONE DETECTED NONE DETECTED   MDMA (Ecstasy)Ur Screen NONE DETECTED NONE DETECTED   Cocaine Metabolite,Ur [place] NONE DETECTED NONE DETECTED   Opiate, Ur Screen NONE DETECTED NONE DETECTED   Phencyclidine (PCP) Ur S NONE DETECTED NONE DETECTED   Cannabinoid 50 Ng, Ur [place] NONE DETECTED NONE DETECTED   Barbiturates, Ur Screen NONE DETECTED NONE DETECTED   Benzodiazepine, Ur Scrn TEST NOT PERFORMED, REAGENT NOT AVAILABLE (A) NONE DETECTED   Methadone Scn, Ur NONE DETECTED NONE DETECTED    Comment: (NOTE) Tricyclics + metabolites, urine  Cutoff 1000 ng/mL Amphetamines + metabolites, urine  Cutoff 1000 ng/mL MDMA (Ecstasy), urine              Cutoff 500 ng/mL Cocaine Metabolite, urine          Cutoff 300 ng/mL Opiate + metabolites, urine        Cutoff 300 ng/mL Phencyclidine (PCP), urine         Cutoff 25 ng/mL Cannabinoid, urine                 Cutoff 50 ng/mL Barbiturates + metabolites, urine  Cutoff 200 ng/mL Benzodiazepine, urine              Cutoff 200 ng/mL Methadone, urine                   Cutoff 300 ng/mL The urine drug screen provides only a preliminary,  unconfirmed analytical test result and should not be used for non-medical purposes. Clinical consideration and professional judgment should be applied to any positive drug screen result due to possible interfering substances. A more specific alternate chemical method must be used in order to obtain a confirmed analytical result. Gas chromatography / mass spectrometry (GC/MS) is the preferred confirmat ory method. Performed at [facility], [address] [place], [place] [address]     Blood Alcohol level:  Lab Results  Component Value Date   [facility] <10 02/25/2018   ETH <10 78/29/5621    Metabolic Disorder Labs:  Lab Results  Component Value Date   HGBA1C 4.8 09/18/2017   MPG 91.06 09/18/2017   No results found for: PROLACTIN Lab Results  Component Value Date   CHOL 251 (H) 09/18/2017   TRIG 124 09/18/2017   HDL 66 09/18/2017   CHOLHDL 3.8 09/18/2017   VLDL 25 09/18/2017   LDLCALC 160 (H) 09/18/2017   LDLCALC 43 05/14/2014    Current Medications: Current Facility-Administered Medications  Medication Dose Route Frequency Provider Last Rate Last Dose  . acetaminophen (TYLENOL) tablet 650 mg  650 mg Oral Q6H PRN [name], [name], MD   650 mg at 02/27/18 0330  . alum & mag hydroxide-simeth (MAALOX/MYLANTA) 200-200-20 MG/5ML suspension 30 mL  30 mL Oral Q4H PRN [name], [name], MD      . brimonidine (ALPHAGAN) 0.2 % ophthalmic solution 1 drop  1 drop Both Eyes BID [name], [name], MD   1 drop at 02/27/18 0802  . carbamazepine (TEGRETOL) tablet 200 mg  200 mg Oral TID [name], [name], MD   200 mg at 02/27/18 1222  . Chlorhexidine Gluconate Cloth 2 % PADS 6 each  6 each Topical Q0600 [name], [name], MD      . dicyclomine (BENTYL) tablet 20 mg  20 mg Oral TID PRN [name], [name], MD      . famotidine (PEPCID) tablet 20 mg  20 mg Oral Daily [name], [name], MD   20 mg at 02/27/18 0801  . [START ON 03/01/2018] haloperidol (HALDOL)  tablet 2.5 mg  2.5 mg Oral Q M,W,F-HD [name], [name], MD      . haloperidol (HALDOL) tablet 5 mg  5 mg Oral QHS [name], [name], MD   5 mg at 02/26/18 2116  . [START ON 03/01/2018] lidocaine-prilocaine (EMLA) cream 1 application  1 application Topical Q M,W,F [name], [name], MD      . magnesium hydroxide (MILK OF MAGNESIA) suspension 30 mL  30 mL Oral Daily PRN [name], [name], MD      .  metoprolol tartrate (LOPRESSOR) tablet 25 mg  25 mg Oral BID [name], [name], MD   25 mg at 02/27/18 0801  . multivitamin (RENA-VIT) tablet 1 tablet  1 tablet Oral Daily [name], [name], MD   1 tablet at 02/27/18 0801  . rOPINIRole (REQUIP) tablet 2 mg  2 mg Oral QHS [name], [name], MD   2 mg at 02/26/18 2153  . temazepam (RESTORIL) capsule 15 mg  15 mg Oral QHS PRN [name], [name], MD      . timolol (TIMOPTIC) 0.5 % ophthalmic solution 1 drop  1 drop Both Eyes BID [name], [name], MD   1 drop at 02/27/18 0801   PTA Medications: Medications Prior to Admission  Medication Sig Dispense Refill Last Dose  . acetaminophen (TYLENOL) 325 MG tablet Take 325 mg 2 (two) times daily as needed by mouth for moderate pain or headache.    PRN at PRN  . brimonidine (ALPHAGAN) 0.2 % ophthalmic solution Place 1 drop into both eyes 2 (two) times daily. 5 mL 1 Unknown at Unknown  . carbamazepine (TEGRETOL) 200 MG tablet Take 1 tablet (200 mg total) by mouth 3 (three) times daily. (Patient taking differently: Take 200 mg by mouth 2 (two) times daily. ) 90 tablet 1 Unknown at Unknown  . cholecalciferol (VITAMIN D) 1000 UNITS tablet Take 1,000 Units by mouth daily.   Unknown at Unknown  . cyanocobalamin (,VITAMIN B-12,) 1000 MCG/ML injection Inject 1,000 mcg into the muscle every 30 (thirty) days.   Unknown at Unknown  . dicyclomine (BENTYL) 20 MG tablet Take 1 tablet (20 mg total) by mouth 3 (three) times daily before meals. 90 tablet 1 Unknown at Unknown  . epoetin alfa  (EPOGEN,PROCRIT) 4000 UNIT/ML injection Inject 1 mL (4,000 Units total) into the vein every Monday, Wednesday, and Friday with hemodialysis. (Patient not taking: Reported on 02/26/2018) 1 mL 6 Not Taking at Unknown time  . famotidine (PEPCID) 20 MG tablet TAKE 1 TABLET BY MOUTH EVERY DAY 30 tablet 1 Unknown at Unknown  . haloperidol (HALDOL) 5 MG tablet Take 2.5 mg by mouth every Monday, Wednesday, and Friday with hemodialysis.   PRN at PRN  . lidocaine (LIDODERM) 5 % Place 1 patch onto the skin daily. Remove & Discard patch within 12 hours or as directed by MD (Patient not taking: Reported on 02/26/2018) 30 patch 0 Not Taking at Unknown time  . lidocaine-prilocaine (EMLA) cream Apply 1 application every Monday, Wednesday, and Friday topically. At dialysis  3 PRN at PRN  . LORazepam (ATIVAN) 2 MG tablet Take 1 tablet (2 mg total) by mouth at bedtime. (Patient not taking: Reported on 02/26/2018) 30 tablet 0 Not Taking at Unknown time  . metoprolol tartrate (LOPRESSOR) 25 MG tablet Take 0.5 tablets (12.5 mg total) by mouth 2 (two) times daily. (Patient not taking: Reported on 02/26/2018) 60 tablet 1 Not Taking at Unknown time  . midodrine (PROAMATINE) 10 MG tablet Take 1 tablet (10 mg total) by mouth daily. 30 tablet 1 Unknown at Unknown  . multivitamin (RENA-VIT) TABS tablet TAKE 1 TABLET BY MOUTH EVERYDAY AT BEDTIME (Patient taking differently: No sig reported) 30 tablet 0 Unknown at Unknown  . OLANZapine (ZYPREXA) 15 MG tablet Take 1 tablet (15 mg total) by mouth at bedtime. (Patient taking differently: Take 2.5 mg by mouth at bedtime. ) 30 tablet 1 Unknown at Unknown  . pantoprazole (PROTONIX) 40 MG tablet Take 40 mg by mouth every morning.   Unknown at Unknown  .  rOPINIRole (REQUIP) 2 MG tablet Take 1 tablet (2 mg total) by mouth at bedtime. (Patient taking differently: Take 2 mg by mouth daily. ) 30 tablet 1 Unknown at Unknown  . timolol (TIMOPTIC) 0.5 % ophthalmic solution Place 1 drop into both eyes 2  (two) times daily. 10 mL 1 Unknown at Unknown    Musculoskeletal: Strength & Muscle Tone: within normal limits Gait & Station: normal Patient leans: N/A  Psychiatric Specialty Exam: Physical Exam  Constitutional: She is oriented to person, place, and time. She appears well-developed and well-nourished.  HENT:  Head: Normocephalic and atraumatic.  Neck: Normal range of motion. Neck supple. No thyroid mass and no thyromegaly present.  Cardiovascular: Normal rate and regular rhythm.  GI: Soft. Normal appearance. She exhibits no distension and no mass.  Neurological: She is alert and oriented to person, place, and time. No cranial nerve deficit.  Skin: Skin is warm and intact.  Psychiatric: Her speech is normal and behavior is normal. Judgment and thought content normal. Her mood appears anxious. Cognition and memory are normal.    Review of Systems  Constitutional: Negative.   HENT: Negative.   Eyes: Negative.   Respiratory: Negative.   Cardiovascular: Negative.   Gastrointestinal: Negative.   Musculoskeletal: Negative.   Skin: Negative.   Neurological: Negative.   Endo/Heme/Allergies: Negative.   Psychiatric/Behavioral: Negative for depression, hallucinations, substance abuse and suicidal ideas. The patient is nervous/anxious and has insomnia.     Blood pressure (!) 151/62, pulse 91, temperature 97.6 F (36.4 C), temperature source Oral, resp. rate 18, height '5\' 3"'$  (1.6 m), weight 65.3 kg, SpO2 97 %.Body mass index is 25.51 kg/m.  General Appearance: Casual  Eye Contact:  Good  Speech:  Clear and Coherent and Pressured  Volume:  Normal  Mood:  Anxious  Affect:  Congruent  Thought Process:  Mildly tangential  Orientation:  Full (Time, Place, and Person)  Thought Content:  Logical  Suicidal Thoughts:  No  Homicidal Thoughts:  No  Memory:  Immediate;   Fair Recent;   Fair Remote;   Fair  Judgement:  Fair  Insight:  Fair  Psychomotor Activity:  Normal  Concentration:   Concentration: Fair and Attention Span: Fair  Recall:  [employer] of Knowledge:  Fair  Language:  Good  Akathisia:  No  Handed:  Right  AIMS (if indicated):     Assets:  Chartered certified accountant  ADL's:  Intact  Cognition:  WNL  Sleep:       Treatment Plan Summary:  who was admitted to psychiatry after she presented with manic symptoms at home in the context of being noncompliant with medications.  She will be admitted to inpatient psychiatry for medication management, safety and stabilization. The patient was admitted to the hospital and wil be restarted on psychotropic medications    Bipolar disorder, most recent episode manic - She will restart Tegretol 200 mg p.o. 3 times daily.  Baseline Tegretol level was not checked in the emergency room and will check as soon as possible today.  -We we will continue Haldol 5 mg p.o. nightly and 2.5 mg prior to hemodialysis on M/W/F -We will continue temazepam 15 mg p.o. nightly for insomnia  CKD on hemodialysis -Patient will continue hemodialysis M/W/F -Creatinine of 4.4 - Continue Metoprolol '25mg'$  po BID - Appreciate Dr Abigail Butts from nephrology following the patent   Restless Leg Syndrome: -Continue Requip '2mg'$  po nightly  Continue opthalmic eye drops  Will consult [facility] to help  with discharge planning and followup care  Disposition:  -We will need to have further discussions with the patient's son who is her legal guardian and daughter-in-law with regards to the patient returning home.  She may need placement -The patient is trying to return to see Dr. Thurmond Butts for psychotropic medication management.     Daily contact with patient to assess and evaluate symptoms and progress in treatment and Medication management     Physician Treatment Plan for Primary Diagnosis:  Bipolar Disorder  Long Term Goal(s): Improvement in symptoms so as ready for discharge  Short Term Goals: Ability to verbalize feelings will  improve, Ability to disclose and discuss suicidal ideas, Ability to identify and develop effective coping behaviors will improve and Compliance with prescribed medications will improve  Physician Treatment Plan for Secondary Diagnosis: Active Problems:   Bipolar I disorder, current or most recent episode manic, with psychotic features ([place])  Long Term Goal(s): Improvement in symptoms so as ready for discharge  Short Term Goals: Ability to verbalize feelings will improve, Ability to disclose and discuss suicidal ideas and Compliance with prescribed medications will improve  I certify that inpatient services furnished can reasonably be expected to improve the patient's condition.    Chauncey Mann, MD 8/10/20191:03 PM

## 2018-02-28 NOTE — Progress Notes (Signed)
[facility] MD Progress Note  03/01/2018 8:33 PM Joyce [name]  MRN:  [id number]  Subjective:   Joyce [name] is pleasant and engaging. No behavioral problems. She completed dialysis today. She still feels that she is ready to return to independent living and made some arrangement with her family and nephrologist to make it possible. Tolerates medications well. No somatic complaints.   Principal Problem: Bipolar I disorder, current or most recent episode manic, with psychotic features ([place]) Diagnosis:   Patient Active Problem List   Diagnosis Date Noted  . Bipolar I disorder, current or most recent episode manic, with psychotic features ([place]) [F31.2] 02/26/2018    Priority: High  . Bipolar affective disorder, current episode depressed with psychotic symptoms ([place]) [F31.2] 09/17/2017    Priority: High  . Bipolar I disorder, most recent episode (or current) manic ([place]) [F31.10] 09/02/2017    Priority: High  . UTI (urinary tract infection) [N39.0] 12/16/2017  . Bipolar 1 disorder, depressed ([place]) [F31.9] 12/15/2017  . Palliative care encounter [Z51.5]   . Acute delirium [R41.0] 09/02/2017  . Altered mental status [R41.82]   . Acute encephalopathy [G93.40] 09/01/2017  . ESRD on dialysis ([place]) [N18.6, Z99.2] 04/06/2017  . Complication of vascular access for dialysis [T82.9XXA] 04/06/2017  . Hematuria [R31.9] 12/18/2016  . Polycystic kidney [Q61.3] 10/12/2016  . Ruptured cyst of kidney [Q61.00] 10/03/2016  . HTN (hypertension) [I10] 10/01/2016  . Chronic kidney disease [N18.9] 10/01/2016  . Anemia associated with chronic renal failure [N18.9, D63.1] 09/24/2016  . Acute kidney insufficiency [N28.9] 07/28/2016  . Noninfectious diarrhea [K52.9]   . Benign neoplasm of cecum [D12.0]   . Benign neoplasm of ascending colon [D12.2]   . Diarrhea [R19.7]   . Nausea [R11.0]   . Gastric polyp [K31.7]   . Chronic constipation [K59.09] 12/27/2014  . Nausea with vomiting [R11.2] 12/27/2014  . Left sided  abdominal pain [R10.9] 12/27/2014  . Personal history of colonic polyps [Z86.010] 05/10/2013   Total Time spent with patient: 20 minutes  Past Psychiatric History: bipolar disorder  Past Medical History:  Past Medical History:  Diagnosis Date  . Anal fissure   . Bipolar affective disorder ([place])   . CKD (chronic kidney disease)    Dr Holley Raring Joyce [name] 5  . Colon polyps   . Diverticulitis   . Diverticulitis   . Family history of adverse reaction to anesthesia    mom - PONV  . GERD (gastroesophageal reflux disease)   . Headache    migraines - none over 10 yrs  . Heart murmur   . History of hiatal hernia   . Hypertension   . Pancreatitis    Valproic acid  . Vertigo     Past Surgical History:  Procedure Laterality Date  . A/V FISTULAGRAM Left 04/14/2017   Procedure: A/V Fistulagram;  Surgeon: Katha Cabal, MD;  Location: [place] [facility];  Service: Cardiovascular;  Laterality: Left;  . A/V FISTULAGRAM Left 06/09/2017   Procedure: A/V FISTULAGRAM;  Surgeon: Katha Cabal, MD;  Location: [place] [facility];  Service: Cardiovascular;  Laterality: Left;  . A/V FISTULAGRAM Left 11/02/2017   Procedure: A/V FISTULAGRAM;  Surgeon: Algernon Huxley, MD;  Location: [place] [facility];  Service: Cardiovascular;  Laterality: Left;  . A/V SHUNT INTERVENTION N/A 11/02/2017   Procedure: A/V SHUNT INTERVENTION;  Surgeon: Algernon Huxley, MD;  Location: [place] [facility];  Service: Cardiovascular;  Laterality: N/A;  . ABDOMINAL HYSTERECTOMY  1990 ?  . AV FISTULA PLACEMENT  4/30  . BREAST EXCISIONAL BIOPSY Left 1994   neg surgical bx  . CHOLECYSTECTOMY  2003  . COLONOSCOPY  2014   Dr. Jamal Collin  . COLONOSCOPY WITH PROPOFOL N/A 09/24/2015   Procedure: COLONOSCOPY WITH random colon byopies.;  Surgeon: Lucilla Lame, MD;  Location: [place];  Service: Endoscopy;  Laterality: N/A;  . ESOPHAGOGASTRODUODENOSCOPY (EGD) WITH PROPOFOL N/A 09/24/2015   Procedure: ESOPHAGOGASTRODUODENOSCOPY  (EGD) ;  Surgeon: Lucilla Lame, MD;  Location: [place];  Service: Endoscopy;  Laterality: N/A;  . EYE SURGERY    . PERIPHERAL VASCULAR CATHETERIZATION N/A 05/29/2015   Procedure: A/V Shuntogram/Fistulagram;  Surgeon: Katha Cabal, MD;  Location: [place] [facility];  Service: Cardiovascular;  Laterality: N/A;  . PERIPHERAL VASCULAR CATHETERIZATION N/A 05/29/2015   Procedure: A/V Shunt Intervention;  Surgeon: Katha Cabal, MD;  Location: [place] [facility];  Service: Cardiovascular;  Laterality: N/A;  . POLYPECTOMY  09/24/2015   Procedure: POLYPECTOMY INTESTINAL;  Surgeon: Lucilla Lame, MD;  Location: [place];  Service: Endoscopy;;  cecal polyp ascending polyp   Family History:  Family History  Problem Relation Age of Onset  . Stroke Father   . Hypertension Father   . Breast cancer Other   . Colon cancer Neg Hx   . Liver disease Neg Hx    Family Psychiatric  History: none Social History:  Social History   Substance and Sexual Activity  Alcohol Use No  . Alcohol/week: 0.0 standard drinks     Social History   Substance and Sexual Activity  Drug Use No    Social History   Socioeconomic History  . Marital status: Single    Spouse name: Not on file  . Number of children: 1  . Years of education: Not on file  . Highest education level: Not on file  Occupational History  . Occupation: Surveyor, quantity: [employer]  Social Needs  . Financial resource strain: Not on file  . Food insecurity:    Worry: Not on file    Inability: Not on file  . Transportation needs:    Medical: Not on file    Non-medical: Not on file  Tobacco Use  . Smoking status: Never Smoker  . Smokeless tobacco: Never Used  Substance and Sexual Activity  . Alcohol use: No    Alcohol/week: 0.0 standard drinks  . Drug use: No  . Sexual activity: Not Currently  Lifestyle  . Physical activity:    Days per week: Not on file    Minutes per session: Not on file  . Stress:  Not on file  Relationships  . Social connections:    Talks on phone: Not on file    Gets together: Not on file    Attends religious service: Not on file    Active member of club or organization: Not on file    Attends meetings of clubs or organizations: Not on file    Relationship status: Not on file  Other Topics Concern  . 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   Additional Social History:                         Sleep: Fair  Appetite:  Fair  Current Medications: Current Facility-Administered Medications  Medication Dose Route Frequency Provider Last Rate Last Dose  . acetaminophen (TYLENOL) tablet 650  mg  650 mg Oral Q6H PRN Imanuel Pruiett B, MD   650 mg at 02/27/18 0330  . alum & mag hydroxide-simeth (MAALOX/MYLANTA) 200-200-20 MG/5ML suspension 30 mL  30 mL Oral Q4H PRN Nishita Isaacks B, MD      . brimonidine (ALPHAGAN) 0.2 % ophthalmic solution 1 drop  1 drop Both Eyes BID Chavez Rosol B, MD   1 drop at 03/01/18 1652  . carbamazepine (TEGRETOL) tablet 200 mg  200 mg Oral TID Bentli Llorente B, MD   200 mg at 03/01/18 1653  . Chlorhexidine Gluconate Cloth 2 % PADS 6 each  6 each Topical Q0600 Ilyanna Baillargeon B, MD      . dicyclomine (BENTYL) tablet 20 mg  20 mg Oral TID PRN Deseray Daponte B, MD      . famotidine (PEPCID) tablet 20 mg  20 mg Oral Daily Lenon Kuennen B, MD   20 mg at 03/01/18 1657  . haloperidol (HALDOL) tablet 2.5 mg  2.5 mg Oral Q M,W,F-HD Brett Soza B, MD      . haloperidol (HALDOL) tablet 5 mg  5 mg Oral QHS Eman Rynders B, MD   5 mg at 02/28/18 2101  . lidocaine-prilocaine (EMLA) cream 1 application  1 application Topical Q M,W,F Hannah Strader B, MD   1 application at 78/93/81 1028  . magnesium hydroxide (MILK OF MAGNESIA) suspension 30 mL  30 mL Oral Daily PRN Gearald Stonebraker B, MD      .  metoprolol tartrate (LOPRESSOR) tablet 25 mg  25 mg Oral BID Dashaun Onstott B, MD   25 mg at 03/01/18 1653  . multivitamin (RENA-VIT) tablet 1 tablet  1 tablet Oral Daily Quindon Denker B, MD   1 tablet at 03/01/18 1656  . rOPINIRole (REQUIP) tablet 2 mg  2 mg Oral QHS Langston Summerfield B, MD   2 mg at 02/28/18 2101  . temazepam (RESTORIL) capsule 15 mg  15 mg Oral QHS PRN Jarely Juncaj B, MD      . timolol (TIMOPTIC) 0.5 % ophthalmic solution 1 drop  1 drop Both Eyes BID Casie Sturgeon B, MD   1 drop at 03/01/18 1651    Lab Results:  No results found for this or any previous visit (from the past 48 hour(s)).  Blood Alcohol level:  Lab Results  Component Value Date   ETH <10 02/25/2018   ETH <10 01/75/1025    Metabolic Disorder Labs: Lab Results  Component Value Date   HGBA1C 4.8 09/18/2017   MPG 91.06 09/18/2017   No results found for: PROLACTIN Lab Results  Component Value Date   CHOL 251 (H) 09/18/2017   TRIG 124 09/18/2017   HDL 66 09/18/2017   CHOLHDL 3.8 09/18/2017   VLDL 25 09/18/2017   LDLCALC 160 (H) 09/18/2017   LDLCALC 43 05/14/2014    Physical Findings: AIMS: Facial and Oral Movements Muscles of Facial Expression: None, normal Lips and Perioral Area: None, normal Jaw: None, normal Tongue: None, normal,Extremity Movements Upper (arms, wrists, hands, fingers): None, normal Lower (legs, knees, ankles, toes): None, normal, Trunk Movements Neck, shoulders, hips: None, normal, Overall Severity Severity of abnormal movements (highest score from questions above): None, normal Incapacitation due to abnormal movements: None, normal Patient's awareness of abnormal movements (rate only patient's report): No Awareness, Dental Status Current problems with teeth and/or dentures?: No Does patient usually wear dentures?: No  CIWA:  CIWA-Ar Total: 2 COWS:  COWS Total Score: 2  Musculoskeletal: Strength & Muscle Tone: within  normal limits Gait &  Station: normal Patient leans: N/A  Psychiatric Specialty Exam: Physical Exam  Nursing note and vitals reviewed. Psychiatric: She has a normal mood and affect. Her speech is normal and behavior is normal. Judgment and thought content normal. Cognition and memory are normal.    Review of Systems  Neurological: Negative.   Psychiatric/Behavioral: Negative.   All other systems reviewed and are negative.   Blood pressure (!) 94/54, pulse 65, temperature (!) 97.4 F (36.3 C), temperature source Oral, resp. rate 18, height 5\' 3"  (1.6 m), weight 66.5 kg, SpO2 100 %.Body mass index is 25.97 kg/m.  General Appearance: Casual  Eye Contact:  Good  Speech:  Clear and Coherent  Volume:  Normal  Mood:  Euthymic  Affect:  Appropriate  Thought Process:  Goal Directed and Descriptions of Associations: Intact  Orientation:  Full (Time, Place, and Person)  Thought Content:  WDL  Suicidal Thoughts:  No  Homicidal Thoughts:  No  Memory:  Immediate;   Fair Recent;   Fair Remote;   Fair  Judgement:  Fair  Insight:  Present  Psychomotor Activity:  Normal  Concentration:  Concentration: Fair and Attention Span: Fair  Recall:  [employer] of Knowledge:  Fair  Language:  Fair  Akathisia:  No  Handed:  Right  AIMS (if indicated):     Assets:  Communication Skills Desire for Improvement Financial Resources/Insurance Resilience Social Support  ADL's:  Intact  Cognition:  WNL  Sleep:  Number of Hours: 7.3     Treatment Plan Summary: Daily contact with patient to assess and evaluate symptoms and progress in treatment and Medication management   Ms. Perezgarcia is a 72 y/o who was admitted to psychiatry after she presented with manic symptoms at home in the context of being noncompliant with medications. She was restered on medications and tolerates them well.   #Bipolar disorder, most recent episode manic - continue Tegretol 200 mg TID, Tegretol level 9.6 -continue Haldol 5mg  po nightly and  2.5mg  po prior to hemodialysis on M/W/F -continue temazepam 15mg  po nightly for insomnia  #CKD on hemodialysis -continue hemodialysis M/W/F -Creatinine of 4.4 - continue Metoprolol 25mg  po BID - Appreciate input from Dr Abigail Butts from nephrology following the patent  #Restless Leg Syndrome: -Continue Requip 2mg  po nightly  #Continue opthalmic eye drops  #Will consult [facility] to help with discharge planning and followup care  #Disposition:  -We will need to have further discussions with the patient's son who is HCPOA and daughter-in-law with regards to the patient returning home. She may need placement -The patient is trying to return to see Dr. Thurmond Butts for psychotropic medication management.  Orson Slick, MD 03/01/2018, 8:33 PM

## 2018-02-28 NOTE — Plan of Care (Signed)
Cooperative with treatment and medication compliant on shift.

## 2018-02-28 NOTE — Plan of Care (Signed)
Patient  alert and oriented to self, place and situation. Denies SI, HI, AVH and pain, at this time. Calm and cooperative at this time. Emotional support offered. Encouraged pt to voice concerns. Patient up in the milieu for meals and medications with minimal peer interaction noted. Dialysis to begin on Monday. Milieu remains safe with q 15 minute safety checks. AV fistula to L) atm, + for thrill and bruit.

## 2018-02-28 NOTE — [facility] Group Notes (Signed)
LCSW Group Therapy Note 02/28/2018 1:15pm  Type of Therapy and Topic: Group Therapy: Feelings Around Returning Home & Establishing a Supportive Framework and Supporting Oneself When Supports Not Available  Participation Level: Active  Description of Group:  Patients first processed thoughts and feelings about upcoming discharge. These included fears of upcoming changes, lack of change, new living environments, judgements and expectations from others and overall stigma of mental health issues. The group then discussed the definition of a supportive framework, what that looks and feels like, and how do to discern it from an unhealthy non-supportive network. The group identified different types of supports as well as what to do when your family/friends are less than helpful or unavailable  Therapeutic Goals  1. Patient will identify one healthy supportive network that they can use at discharge. 2. Patient will identify one factor of a supportive framework and how to tell it from an unhealthy network. 3. Patient able to identify one coping skill to use when they do not have positive supports from others. 4. Patient will demonstrate ability to communicate their needs through discussion and/or role plays.  Summary of Patient Progress:  Pt engaged during group session. As patients processed their anxiety about discharge and described healthy supports patient shared she is ready to be discharge. She stated "I have hopes that I am going home." Pt listed her family as her main support system. Patients identified at least one self-care tool they were willing to use after discharge.   Therapeutic Modalities Cognitive Behavioral Therapy Motivational Interviewing   Joyce [name]  CUEBAS-COLON, LCSW 02/28/2018 11:58 AM

## 2018-02-28 NOTE — Progress Notes (Signed)
[facility] MD Progress Note  02/28/2018 2:58 PM Joyce [name]  MRN:  [id number]    Subjective:    Overall, patient is doing fairly well.  Affect is bright and cheerful today.  She denies any current feelings of hopelessness.  She is hopeful and positive about being able to live independently again and does not want to live with her daughter-in-law.  She describes her daughter-in-law's being too controlling.  She denies any current active or passive suicidal thoughts or psychotic symptoms.  She denies any auditory or visual hallucinations.  No paranoid thoughts or delusions.  She was attending groups yesterday and has been social on the unit privately with staff and peers.  She slept over 7 hours last night.  Vital signs are stable.  Tegretol level was 9.6.  Past psychiatric history. Long history of bipolar with several hospitalization sand many medication trials. She has been in the care of Dr. Lanetta Inch in the past and has had ECT.  She has a history of one overdose attempt in the past over 20 years ago.  Family psychiatric history: None  Social history. The patient was born and raised in person [place] by both her biological parents.  She graduated from high school and did some college.  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  The patient divorced in 72 and has 1 son and 1 daughter-in-law.  She currently lives with her son and daughter-in-law in [place].  Substance Abuse History: history of any heavy alcohol use or illicit drug use   Legal history She denies an+y prior arrest or incarcerations.    Principal Problem: <principal problem not specified> Diagnosis:   Patient Active Problem List   Diagnosis Date Noted  . Bipolar I disorder, current or most recent episode manic, with psychotic features ([place]) [F31.2] 02/26/2018  . UTI (urinary tract infection) [N39.0] 12/16/2017  . Bipolar 1 disorder, depressed ([place]) [F31.9] 12/15/2017  . Palliative care  encounter [Z51.5]   . Bipolar affective disorder, current episode depressed with psychotic symptoms ([place]) [F31.2] 09/17/2017  . Bipolar I disorder, most recent episode (or current) manic ([place]) [F31.10] 09/02/2017  . Acute delirium [R41.0] 09/02/2017  . Altered mental status [R41.82]   . Acute encephalopathy [G93.40] 09/01/2017  . ESRD on dialysis ([place]) [N18.6, Z99.2] 04/06/2017  . Complication of vascular access for dialysis [T82.9XXA] 04/06/2017  . Hematuria [R31.9] 12/18/2016  . Polycystic kidney [Q61.3] 10/12/2016  . Ruptured cyst of kidney [Q61.00] 10/03/2016  . HTN (hypertension) [I10] 10/01/2016  . Chronic kidney disease [N18.9] 10/01/2016  . Anemia associated with chronic renal failure [N18.9, D63.1] 09/24/2016  . Acute kidney insufficiency [N28.9] 07/28/2016  . Noninfectious diarrhea [K52.9]   . Benign neoplasm of cecum [D12.0]   . Benign neoplasm of ascending colon [D12.2]   . Diarrhea [R19.7]   . Nausea [R11.0]   . Gastric polyp [K31.7]   . Chronic constipation [K59.09] 12/27/2014  . Nausea with vomiting [R11.2] 12/27/2014  . Left sided abdominal pain [R10.9] 12/27/2014  . Personal history of colonic polyps [Z86.010] 05/10/2013   Total Time spent with patient: 20 minutes    Past Medical History:  Past Medical History:  Diagnosis Date  . Anal fissure   . Bipolar affective disorder ([place])   . CKD (chronic kidney disease)    Dr Holley Raring [name] 4  . Colon polyps   . Diverticulitis   . Diverticulitis   . Family history of adverse reaction to anesthesia    mom -  PONV  . GERD (gastroesophageal reflux disease)   . Headache    migraines - none over 10 yrs  . Heart murmur   . History of hiatal hernia   . Hypertension   . Pancreatitis    Valproic acid  . Vertigo     Past Surgical History:  Procedure Laterality Date  . A/V FISTULAGRAM Left 04/14/2017   Procedure: A/V Fistulagram;  Surgeon: Katha Cabal, MD;  Location: [place] [facility];  Service:  Cardiovascular;  Laterality: Left;  . A/V FISTULAGRAM Left 06/09/2017   Procedure: A/V FISTULAGRAM;  Surgeon: Katha Cabal, MD;  Location: [place] [facility];  Service: Cardiovascular;  Laterality: Left;  . A/V FISTULAGRAM Left 11/02/2017   Procedure: A/V FISTULAGRAM;  Surgeon: Algernon Huxley, MD;  Location: [place] [facility];  Service: Cardiovascular;  Laterality: Left;  . A/V SHUNT INTERVENTION N/A 11/02/2017   Procedure: A/V SHUNT INTERVENTION;  Surgeon: Algernon Huxley, MD;  Location: [place] [facility];  Service: Cardiovascular;  Laterality: N/A;  . ABDOMINAL HYSTERECTOMY  1990 ?  . AV FISTULA PLACEMENT  4/30  . BREAST EXCISIONAL BIOPSY Left 1994   neg surgical bx  . CHOLECYSTECTOMY  2003  . COLONOSCOPY  2014   Dr. Jamal Collin  . COLONOSCOPY WITH PROPOFOL N/A 09/24/2015   Procedure: COLONOSCOPY WITH random colon byopies.;  Surgeon: Lucilla Lame, MD;  Location: [place];  Service: Endoscopy;  Laterality: N/A;  . ESOPHAGOGASTRODUODENOSCOPY (EGD) WITH PROPOFOL N/A 09/24/2015   Procedure: ESOPHAGOGASTRODUODENOSCOPY (EGD) ;  Surgeon: Lucilla Lame, MD;  Location: [place];  Service: Endoscopy;  Laterality: N/A;  . EYE SURGERY    . PERIPHERAL VASCULAR CATHETERIZATION N/A 05/29/2015   Procedure: A/V Shuntogram/Fistulagram;  Surgeon: Katha Cabal, MD;  Location: [place] [facility];  Service: Cardiovascular;  Laterality: N/A;  . PERIPHERAL VASCULAR CATHETERIZATION N/A 05/29/2015   Procedure: A/V Shunt Intervention;  Surgeon: Katha Cabal, MD;  Location: [place] [facility];  Service: Cardiovascular;  Laterality: N/A;  . POLYPECTOMY  09/24/2015   Procedure: POLYPECTOMY INTESTINAL;  Surgeon: Lucilla Lame, MD;  Location: [place];  Service: Endoscopy;;  cecal polyp ascending polyp   Family History:  Family History  Problem Relation Age of Onset  . Stroke Father   . Hypertension Father   . Breast cancer Other   . Colon cancer Neg Hx   . Liver disease Neg  Hx     Social History:  Social History   Substance and Sexual Activity  Alcohol Use No  . Alcohol/week: 0.0 standard drinks     Social History   Substance and Sexual Activity  Drug Use No    Social History   Socioeconomic History  . Marital status: Single    Spouse name: Not on file  . Number of children: 1  . Years of education: Not on file  . Highest education level: Not on file  Occupational History  . Occupation: Surveyor, quantity: [employer]  Social Needs  . Financial resource strain: Not on file  . Food insecurity:    Worry: Not on file    Inability: Not on file  . Transportation needs:    Medical: Not on file    Non-medical: Not on file  Tobacco Use  . Smoking status: Never Smoker  . Smokeless tobacco: Never Used  Substance and Sexual Activity  . Alcohol use: No    Alcohol/week: 0.0 standard drinks  . Drug use: No  .  Sexual activity: Not Currently  Lifestyle  . Physical activity:    Days per week: Not on file    Minutes per session: Not on file  . Stress: Not on file  Relationships  . Social connections:    Talks on phone: Not on file    Gets together: Not on file    Attends religious service: Not on file    Active member of club or organization: Not on file    Attends meetings of clubs or organizations: Not on file    Relationship status: Not on file  Other Topics Concern  . 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   Additional Social History:                         Sleep: Good  Appetite:  Good  Current Medications: Current Facility-Administered Medications  Medication Dose Route Frequency Provider Last Rate Last Dose  . acetaminophen (TYLENOL) tablet 650 mg  650 mg Oral Q6H PRN [name], [name], MD   650 mg at 02/27/18 0330  . alum & mag hydroxide-simeth (MAALOX/MYLANTA) 200-200-20 MG/5ML suspension 30 mL  30 mL Oral Q4H PRN  [name], [name], MD      . brimonidine (ALPHAGAN) 0.2 % ophthalmic solution 1 drop  1 drop Both Eyes BID [name], [name], MD   1 drop at 02/28/18 0807  . carbamazepine (TEGRETOL) tablet 200 mg  200 mg Oral TID [name], [name], MD   200 mg at 02/28/18 1204  . Chlorhexidine Gluconate Cloth 2 % PADS 6 each  6 each Topical Q0600 [name], [name], MD      . dicyclomine (BENTYL) tablet 20 mg  20 mg Oral TID PRN [name], [name], MD      . famotidine (PEPCID) tablet 20 mg  20 mg Oral Daily [name], [name], MD   20 mg at 02/28/18 0809  . [START ON 03/01/2018] haloperidol (HALDOL) tablet 2.5 mg  2.5 mg Oral Q M,W,F-HD [name], [name], MD      . haloperidol (HALDOL) tablet 5 mg  5 mg Oral QHS [name], [name], MD   5 mg at 02/27/18 2145  . [START ON 03/01/2018] lidocaine-prilocaine (EMLA) cream 1 application  1 application Topical Q M,W,F [name], [name], MD      . magnesium hydroxide (MILK OF MAGNESIA) suspension 30 mL  30 mL Oral Daily PRN [name], [name], MD      . metoprolol tartrate (LOPRESSOR) tablet 25 mg  25 mg Oral BID [name], [name], MD   25 mg at 02/27/18 1701  . multivitamin (RENA-VIT) tablet 1 tablet  1 tablet Oral Daily [name], [name], MD   1 tablet at 02/28/18 0809  . rOPINIRole (REQUIP) tablet 2 mg  2 mg Oral QHS [name], [name], MD   2 mg at 02/27/18 2145  . temazepam (RESTORIL) capsule 15 mg  15 mg Oral QHS PRN [name], [name], MD      . timolol (TIMOPTIC) 0.5 % ophthalmic solution 1 drop  1 drop Both Eyes BID [name], [name], MD   1 drop at 02/28/18 9563    Lab Results:  Results for orders placed or performed during the hospital encounter of 02/26/18 (from the past 48 hour(s))  Carbamazepine level, total     Status: None   Collection Time: 02/27/18  3:21  PM  Result Value Ref Range   Carbamazepine Lvl 9.6 4.0 - 12.0 ug/mL    Comment: Performed at [facility], [address]  [place], [place] [address]    Blood Alcohol level:  Lab Results  Component Value Date   [facility] <10 02/25/2018   ETH <10 35/70/1779    Metabolic Disorder Labs: Lab Results  Component Value Date   HGBA1C 4.8 09/18/2017   MPG 91.06 09/18/2017   No results found for: PROLACTIN Lab Results  Component Value Date   CHOL 251 (H) 09/18/2017   TRIG 124 09/18/2017   HDL 66 09/18/2017   CHOLHDL 3.8 09/18/2017   VLDL 25 09/18/2017   LDLCALC 160 (H) 09/18/2017   LDLCALC 43 05/14/2014    Physical Findings: AIMS: Facial and Oral Movements Muscles of Facial Expression: None, normal Lips and Perioral Area: None, normal Jaw: None, normal Tongue: None, normal,Extremity Movements Upper (arms, wrists, hands, fingers): None, normal Lower (legs, knees, ankles, toes): None, normal, Trunk Movements Neck, shoulders, hips: None, normal, Overall Severity Severity of abnormal movements (highest score from questions above): None, normal Incapacitation due to abnormal movements: None, normal Patient's awareness of abnormal movements (rate only patient's report): No Awareness, Dental Status Current problems with teeth and/or dentures?: No Does patient usually wear dentures?: No  CIWA:  CIWA-Ar Total: 2 COWS:  COWS Total Score: 2  Musculoskeletal: Strength & Muscle Tone: within normal limits Gait & Station: normal Patient leans: N/A  Psychiatric Specialty Exam: Physical Exam  Nursing note and vitals reviewed.   Review of Systems  Constitutional: Negative.   HENT: Negative.   Eyes: Negative.   Respiratory: Negative.   Cardiovascular: Negative.   Gastrointestinal: Negative.   Musculoskeletal: Negative.   Skin: Negative.   Neurological: Negative.     Blood pressure 100/65, pulse 70, temperature 97.9 F (36.6 C), temperature source Oral, resp. rate 16, height 5\' 3"  (1.6 m), weight 65.3 kg, SpO2 100 %.Body mass index is 25.51 kg/m.  General Appearance: Casual  Eye Contact:  Good  Speech:  Clear  and Coherent and Normal Rate  Volume:  Normal  Mood:  "I feel good"  Affect:  Congruent  Thought Process:  Coherent, Goal Directed and Linear  Orientation:  Full (Time, Place, and Person)  Thought Content:  Logical  Suicidal Thoughts:  No  Homicidal Thoughts:  No  Memory:  Immediate;   Fair Recent;   Fair  Judgement:  Fair  Insight:  Fair  Psychomotor Activity:  Normal  Concentration:  Concentration: Fair and Attention Span: Fair  Recall:  [employer] of Knowledge:  Good  Language:  Good  Akathisia:  No  Handed:  Right  AIMS (if indicated):     Assets:  Industrial/product designer  ADL's:  Intact  Cognition:  WNL  Sleep:  Number of Hours: 7.75     Treatment Plan Summary:  Ms. Upham is a 72 y/o who was admitted to psychiatry after she presented with manic symptoms at home in the context of being noncompliant with medications.  She will be admitted to inpatient psychiatry for medication management, safety and stabilization. The patient was admitted to the hospital and wil be restarted on psychotropic medications    Bipolar disorder, most recent episode manic - Mood is stable and no psychosis - Tegretol level was 9.6 which would be expected to be lower if noncompliant with Tegretol - She was restarted on Tegretol 200 mg p.o. 3 times daily for mood stabilization -  We will continue Haldol 5mg  po nightly and 2.5mg  po prior to hemodialysis on M/W/F -We will continue temazepam 15mg  po nightly for insomnia -We will continue temazepam 15 mg p.o. nightly for insomnia  CKD on hemodialysis -Patient will continue hemodialysis M/W/F -Creatinine of 4.4 - Continue Metoprolol 25mg  po BID - Appreciate input from Dr Abigail Butts from nephrology following the patent   Restless Leg Syndrome: -Continue Requip 2mg  po nightly  Continue opthalmic eye drops  Will consult [facility] to help with discharge planning and followup care  Disposition:   -We will need to have further discussions with the patient's son who is her legal guardian and daughter-in-law with regards to the patient returning home.  She may need placement -The patient is trying to return to see Dr. Thurmond Butts for psychotropic medication management.      Daily contact with patient to assess and evaluate symptoms and progress in treatment and Medication management  Chauncey Mann, MD 02/28/2018, 2:58 PM

## 2018-02-28 NOTE — BHH Group Notes (Signed)
[facility] Group Notes:  (Nursing/MHT/Case Management/Adjunct)  Date:  02/28/2018  Time:  3:04 AM  Type of Therapy:  Group Therapy  Participation Level:  Did Not Attend  Ivar Drape 02/28/2018, 3:04 AM

## 2018-03-01 NOTE — Progress Notes (Deleted)
Recreation Therapy Notes   Date: 03/01/2018  Time: 9:30 am   Location: Craft Room   Behavioral response: N/A   Intervention Topic: Problem Solving  Discussion/Intervention: Patient did not attend group.   Clinical Observations/Feedback:  Patient did not attend group.   Burgundy Matuszak LRT/CTRS        Joyce [name] 03/01/2018 11:45 AM

## 2018-03-01 NOTE — Progress Notes (Signed)
Patient's BP down to 94/54 after dialysis and Metoprolol administration. Patient offered fluids, on coming shift will be notified of findings. Patient in bed resting at the above time./

## 2018-03-01 NOTE — Progress Notes (Signed)
Pre HD Assessment    03/01/18 1036  Neurological  Level of Consciousness Alert  Orientation Level Oriented X4  Respiratory  Respiratory Pattern Regular;Unlabored  Chest Assessment Chest expansion symmetrical  Bilateral Breath Sounds Clear  Cardiac  Pulse Regular  ECG Monitor Yes  Integumentary  Integumentary (WDL) WDL  Musculoskeletal  Musculoskeletal (WDL) WDL  GU Assessment  Genitourinary (WDL) X (HD pt)  Psychosocial  Patient Behaviors Appropriate for situation;Cooperative;Calm

## 2018-03-01 NOTE — Progress Notes (Addendum)
Post HD Treatment  System clotted with 14 minutes left of treatment time. Unable to rinse-back venous line, otherwise patient tolerated treatment well without complaints. Her net UF was 1541. Goal met. MD aware.    03/01/18 1456  Vital Signs  Temp (!) 97.4 F (36.3 C)  Temp Source Oral  Pulse Rate 65  Pulse Rate Source Monitor  Resp 18  BP (!) 114/99  BP Location Left Arm  BP Method Automatic  Patient Position (if appropriate) Sitting  Oxygen Therapy  SpO2 100 %  O2 Device Room Air  Dialysis Weight  Weight 66.5 kg  Type of Weight Post-Dialysis  Post-Hemodialysis Assessment  Rinseback Volume (mL) 100 mL  KECN 72.9 V  Dialyzer Clearance Lightly streaked  Duration of HD Treatment -hour(s) 3.25 hour(s)  Hemodialysis Intake (mL) 350 mL  UF Total -Machine (mL) 1891 mL  Net UF (mL) 1541 mL  Tolerated HD Treatment Yes  AVG/AVF Arterial Site Held (minutes) 10 minutes  AVG/AVF Venous Site Held (minutes) 10 minutes

## 2018-03-01 NOTE — BHH Group Notes (Signed)
03/01/2018 1PM  Type of Therapy and Topic:  Group Therapy:  Overcoming Obstacles  Participation Level:  Did Not Attend    Description of Group:    In this group patients will be encouraged to explore what they see as obstacles to their own wellness and recovery. They will be guided to discuss their thoughts, feelings, and behaviors related to these obstacles. The group will process together ways to cope with barriers, with attention given to specific choices patients can make. Each patient will be challenged to identify changes they are motivated to make in order to overcome their obstacles. This group will be process-oriented, with patients participating in exploration of their own experiences as well as giving and receiving support and challenge from other group members.   Therapeutic Goals: 1. Patient will identify personal and current obstacles as they relate to admission. 2. Patient will identify barriers that currently interfere with their wellness or overcoming obstacles.  3. Patient will identify feelings, thought process and behaviors related to these barriers. 4. Patient will identify two changes they are willing to make to overcome these obstacles:      Summary of Patient Progress Patient was encouraged and invited to attend group. Patient did not attend group. Social worker will continue to encourage group participation in the future.      Therapeutic Modalities:   Cognitive Behavioral Therapy Solution Focused Therapy Motivational Interviewing Relapse Prevention Therapy    Darin Engels MSW, [facility] 03/01/2018 2:41 PM

## 2018-03-01 NOTE — Progress Notes (Signed)
HD Treatment Complete  System clotted. Unable to rinse-back venous line.     03/01/18 1442  Vital Signs  Pulse Rate 65  Pulse Rate Source Monitor  Resp 18  BP (!) 139/49  BP Location Left Arm  BP Method Automatic  Patient Position (if appropriate) Sitting  Oxygen Therapy  SpO2 100 %  O2 Device Room Air  During Hemodialysis Assessment  Dialysis Fluid Bolus Normal Saline  Bolus Amount (mL) 100 mL  Intra-Hemodialysis Comments Tx completed

## 2018-03-01 NOTE — Progress Notes (Signed)
[facility]  ROUNDING NOTE   Subjective:     HEMODIALYSIS FLOWSHEET:  Blood Flow Rate (mL/min): 400 mL/min Arterial Pressure (mmHg): -230 mmHg Venous Pressure (mmHg): 210 mmHg Transmembrane Pressure (mmHg): 70 mmHg Ultrafiltration Rate (mL/min): 570 mL/min Dialysate Flow Rate (mL/min): 600 ml/min Conductivity: Machine : 15.4 Conductivity: Machine : 15.4 Dialysis Fluid Bolus: Normal Saline Bolus Amount (mL): 250 mL    Objective:  Vital signs in last 24 hours:  Temp:  [97.5 F (36.4 C)-97.8 F (36.6 C)] 97.5 F (36.4 C) (08/12 1036) Pulse Rate:  [57-76] 66 (08/12 1315) Resp:  [14-17] 16 (08/12 1315) BP: (127-170)/(52-72) 138/70 (08/12 1315) SpO2:  [99 %-100 %] 100 % (08/12 1315) Weight:  [68.5 kg] 68.5 kg (08/12 1036)  Weight change:  Filed Weights   02/26/18 2051 03/01/18 1036  Weight: 65.3 kg 68.5 kg    Intake/Output: I/O last 3 completed shifts: In: 66 [P.O.:960] Out: -    Intake/Output this shift:  Total I/O In: 480 [P.O.:480] Out: -   Physical Exam: General: NAD,   Head: Normocephalic, atraumatic. Moist oral mucosal membranes  Eyes: Anicteric,  Neck: Supple,   Lungs:  Clear to auscultation  Heart: Regular rate and rhythm  Abdomen:  Soft, nontender,   Extremities:  no peripheral edema.  Neurologic:  Alert, able to answer questions appropriately  Skin: No lesions  Access: Left AVF    Basic Metabolic Panel: Recent Labs  Lab 02/25/18 1705  NA 141  K 3.6  CL 101  CO2 28  GLUCOSE 119*  BUN 20  CREATININE 4.40*  CALCIUM 9.4    Liver Function Tests: Recent Labs  Lab 02/25/18 1705  AST 22  ALT 15  ALKPHOS 68  BILITOT 0.6  PROT 7.1  ALBUMIN 4.2   No results for input(s): LIPASE, AMYLASE in the last 168 hours. No results for input(s): AMMONIA in the last 168 hours.  CBC: Recent Labs  Lab 02/25/18 1705  WBC 8.2  HGB 12.9  HCT 36.9  MCV 97.6  PLT 205    Cardiac Enzymes: No results for input(s): CKTOTAL, CKMB,  CKMBINDEX, TROPONINI in the last 168 hours.  BNP: Invalid input(s): POCBNP  CBG: No results for input(s): GLUCAP in the last 168 hours.  Microbiology: Results for orders placed or performed during the hospital encounter of 09/01/17  Urine culture     Status: Abnormal   Collection Time: 09/01/17  4:32 PM  Result Value Ref Range Status   Specimen Description   Final    URINE, RANDOM Performed at [facility], [address], [place], [place] [address]    Special Requests   Final    NONE Performed at [facility], [address] [place], [place] [address]    Culture MULTIPLE SPECIES PRESENT, SUGGEST RECOLLECTION (A)  Final   Report Status 09/03/2017 FINAL  Final  Culture, blood (routine x 2)     Status: None   Collection Time: 09/01/17  6:05 PM  Result Value Ref Range Status   Specimen Description BLOOD RIGHT HAND  Final   Special Requests   Final    BOTTLES DRAWN AEROBIC AND ANAEROBIC Blood Culture results may not be optimal due to an excessive volume of blood received in culture bottles   Culture   Final    NO GROWTH 5 DAYS Performed at [facility], [address], [place], [place] [address]    Report Status 09/06/2017 FINAL  Final  Culture, blood (routine x 2)  Status: None   Collection Time: 09/01/17  6:34 PM  Result Value Ref Range Status   Specimen Description BLOOD RAC  Final   Special Requests   Final    BOTTLES DRAWN AEROBIC AND ANAEROBIC Blood Culture adequate volume   Culture   Final    NO GROWTH 5 DAYS Performed at [facility], [address] [place], [place] [address]    Report Status 09/06/2017 FINAL  Final    Coagulation Studies: No results for input(s): LABPROT, INR in the last 72 hours.  Urinalysis: No results for input(s): COLORURINE, LABSPEC, PHURINE, GLUCOSEU, HGBUR, BILIRUBINUR, KETONESUR, PROTEINUR, UROBILINOGEN, NITRITE, LEUKOCYTESUR in the last 72 hours.  Invalid input(s): APPERANCEUR     Imaging: No results found.   Medications:    . brimonidine  1 drop Both Eyes BID  . carbamazepine  200 mg Oral TID  . Chlorhexidine Gluconate Cloth  6 each Topical Q0600  . famotidine  20 mg Oral Daily  . haloperidol  2.5 mg Oral Q M,W,F-HD  . haloperidol  5 mg Oral QHS  . lidocaine-prilocaine  1 application Topical Q M,W,F  . metoprolol tartrate  25 mg Oral BID  . multivitamin  1 tablet Oral Daily  . rOPINIRole  2 mg Oral QHS  . timolol  1 drop Both Eyes BID   acetaminophen, alum & mag hydroxide-simeth, dicyclomine, magnesium hydroxide, temazepam  Assessment/ Plan:  [name] is a 72 y.o. white female with end stage renal disease on hemodialysis, hypertension, bipolar disorder, GERD, polycystic kidney disease  [facility] MWF left AVF  1. End Stage Renal Disease  Patient seen during dialysis Tolerating well  Next HD Wednesday Patient requesting transfer to [facility]  2. Hypertension: blood pressure at goal.  - metoprolol  3. Anemia of chronic kidney disease: hemoglobin 12.9 - EPO as outpatient.    4. Secondary Hyperparathyroidism: calcium at goal.  Not currently taking binders.      LOS: [place] 8/12/20191:59 PM

## 2018-03-01 NOTE — Progress Notes (Signed)
Patient off of the unit at this time at dialysis. Patient  Accompanied by MHT.

## 2018-03-01 NOTE — Plan of Care (Signed)
Patient is stable and alert, oriented x4 , attends groups and participating in group activities with out any issues, compliant with her medicines , denies SI/HI and no signs of AVH noted at this time, and 15 minute safety rounding is maintained, patient is sleeping long hours , no distress noted.     Problem: Coping: Goal: Ability to identify and develop effective coping behavior will improve Outcome: Progressing Goal: Demonstration of participation in decision-making regarding own care will improve Outcome: Progressing   Problem: Health Behavior/Discharge Planning: Goal: Identification of resources available to assist in meeting health care needs will improve Outcome: Progressing   Problem: Education: Goal: Utilization of techniques to improve thought processes will improve Outcome: Progressing Goal: Knowledge of the prescribed therapeutic regimen will improve Outcome: Progressing   Problem: Activity: Goal: Interest or engagement in leisure activities will improve Outcome: Progressing Goal: Imbalance in normal sleep/wake cycle will improve Outcome: Progressing   Problem: Health Behavior/Discharge Planning: Goal: Ability to make decisions will improve Outcome: Progressing Goal: Compliance with therapeutic regimen will improve Outcome: Progressing   Problem: Safety: Goal: Ability to disclose and discuss suicidal ideas will improve Outcome: Progressing Goal: Ability to identify and utilize support systems that promote safety will improve Outcome: Progressing   Problem: Self-Concept: Goal: Ability to identify factors that promote anxiety will improve Outcome: Progressing Goal: Level of anxiety will decrease Outcome: Progressing Goal: Ability to modify response to factors that promote anxiety will improve Outcome: Progressing   Problem: Education: Goal: Ability to state activities that reduce stress will improve Outcome: Progressing   Problem: Education: Goal: Knowledge  of General Education information will improve Description Including pain rating scale, medication(s)/side effects and non-pharmacologic comfort measures Outcome: Progressing   Problem: Health Behavior/Discharge Planning: Goal: Ability to manage health-related needs will improve Outcome: Progressing   Problem: Clinical Measurements: Goal: Ability to maintain clinical measurements within normal limits will improve Outcome: Progressing Goal: Will remain free from infection Outcome: Progressing Goal: Diagnostic test results will improve Outcome: Progressing Goal: Respiratory complications will improve Outcome: Progressing Goal: Cardiovascular complication will be avoided Outcome: Progressing   Problem: Activity: Goal: Risk for activity intolerance will decrease Outcome: Progressing   Problem: Nutrition: Goal: Adequate nutrition will be maintained Outcome: Progressing   Problem: Coping: Goal: Level of anxiety will decrease Outcome: Progressing   Problem: Elimination: Goal: Will not experience complications related to bowel motility Outcome: Progressing Goal: Will not experience complications related to urinary retention Outcome: Progressing   Problem: Pain Managment: Goal: General experience of comfort will improve Outcome: Progressing   Problem: Safety: Goal: Ability to remain free from injury will improve Outcome: Progressing   Problem: Skin Integrity: Goal: Risk for impaired skin integrity will decrease Outcome: Progressing

## 2018-03-01 NOTE — Plan of Care (Signed)
Patient has the ability to identify coping behaviors, patient is knowledgeable of her prescribed regimen. Patient has not endorsed any anxiety at this time.Will continue to monitor.

## 2018-03-01 NOTE — Progress Notes (Signed)
HD Initiated    03/01/18 1114  Vital Signs  Temp Source Oral  Pulse Rate (!) 59  Pulse Rate Source Monitor  Resp 16  BP (!) 138/54  BP Location Right Arm  BP Method Automatic  Patient Position (if appropriate) Sitting  Oxygen Therapy  SpO2 99 %  O2 Device Room Air  During Hemodialysis Assessment  Blood Flow Rate (mL/min) 200 mL/min  Arterial Pressure (mmHg) -180 mmHg  Venous Pressure (mmHg) 200 mmHg  Transmembrane Pressure (mmHg) 70 mmHg  Ultrafiltration Rate (mL/min) 570 mL/min  Dialysate Flow Rate (mL/min) 600 ml/min  Conductivity: Machine  13.7  HD Safety Checks Performed Yes  Dialysis Fluid Bolus Normal Saline  Bolus Amount (mL) 250 mL  Intra-Hemodialysis Comments Tx initiated

## 2018-03-01 NOTE — Tx Team (Addendum)
Interdisciplinary Treatment and Diagnostic Plan Update  03/01/2018 Time of Session: 10:30am Joyce [name] MRN: [id number]  Principal Diagnosis: <principal problem not specified>  Secondary Diagnoses: Active Problems:   Bipolar I disorder, current or most recent episode manic, with psychotic features ([place])   Current Medications:  Current Facility-Administered Medications  Medication Dose Route Frequency Provider Last Rate Last Dose  . acetaminophen (TYLENOL) tablet 650 mg  650 mg Oral Q6H PRN [name], [name], MD   650 mg at 02/27/18 0330  . alum & mag hydroxide-simeth (MAALOX/MYLANTA) 200-200-20 MG/5ML suspension 30 mL  30 mL Oral Q4H PRN [name], [name], MD      . brimonidine (ALPHAGAN) 0.2 % ophthalmic solution 1 drop  1 drop Both Eyes BID [name], [name], MD   1 drop at 03/01/18 0824  . carbamazepine (TEGRETOL) tablet 200 mg  200 mg Oral TID [name], [name], MD   200 mg at 02/28/18 1631  . Chlorhexidine Gluconate Cloth 2 % PADS 6 each  6 each Topical Q0600 [name], [name], MD      . dicyclomine (BENTYL) tablet 20 mg  20 mg Oral TID PRN [name], [name], MD      . famotidine (PEPCID) tablet 20 mg  20 mg Oral Daily [name], [name], MD   20 mg at 02/28/18 0809  . haloperidol (HALDOL) tablet 2.5 mg  2.5 mg Oral Q M,W,F-HD [name], [name], MD      . haloperidol (HALDOL) tablet 5 mg  5 mg Oral QHS [name], [name], MD   5 mg at 02/28/18 2101  . lidocaine-prilocaine (EMLA) cream 1 application  1 application Topical Q M,W,F [name], [name], MD   1 application at 19/37/90 1028  . magnesium hydroxide (MILK OF MAGNESIA) suspension 30 mL  30 mL Oral Daily PRN [name], [name], MD      . metoprolol tartrate (LOPRESSOR) tablet 25 mg  25 mg Oral BID [name], [name], MD   25 mg at 03/01/18 0824  . multivitamin (RENA-VIT) tablet 1 tablet  1 tablet Oral Daily [name], [name], MD   1 tablet at 02/28/18 0809  . rOPINIRole  (REQUIP) tablet 2 mg  2 mg Oral QHS [name], [name], MD   2 mg at 02/28/18 2101  . temazepam (RESTORIL) capsule 15 mg  15 mg Oral QHS PRN [name], [name], MD      . timolol (TIMOPTIC) 0.5 % ophthalmic solution 1 drop  1 drop Both Eyes BID [name], [name], MD   1 drop at 03/01/18 0824   PTA Medications: Medications Prior to Admission  Medication Sig Dispense Refill Last Dose  . acetaminophen (TYLENOL) 325 MG tablet Take 325 mg 2 (two) times daily as needed by mouth for moderate pain or headache.    PRN at PRN  . brimonidine (ALPHAGAN) 0.2 % ophthalmic solution Place 1 drop into both eyes 2 (two) times daily. 5 mL 1 Unknown at Unknown  . carbamazepine (TEGRETOL) 200 MG tablet Take 1 tablet (200 mg total) by mouth 3 (three) times daily. (Patient taking differently: Take 200 mg by mouth 2 (two) times daily. ) 90 tablet 1 Unknown at Unknown  . cholecalciferol (VITAMIN D) 1000 UNITS tablet Take 1,000 Units by mouth daily.   Unknown at Unknown  . cyanocobalamin (,VITAMIN B-12,) 1000 MCG/ML injection Inject 1,000 mcg into the muscle every 30 (thirty) days.   Unknown at Unknown  . dicyclomine (BENTYL) 20 MG tablet Take 1 tablet (20 mg  total) by mouth 3 (three) times daily before meals. 90 tablet 1 Unknown at Unknown  . epoetin alfa (EPOGEN,PROCRIT) 4000 UNIT/ML injection Inject 1 mL (4,000 Units total) into the vein every Monday, Wednesday, and Friday with hemodialysis. (Patient not taking: Reported on 02/26/2018) 1 mL 6 Not Taking at Unknown time  . famotidine (PEPCID) 20 MG tablet TAKE 1 TABLET BY MOUTH EVERY DAY 30 tablet 1 Unknown at Unknown  . haloperidol (HALDOL) 5 MG tablet Take 2.5 mg by mouth every Monday, Wednesday, and Friday with hemodialysis.   PRN at PRN  . lidocaine (LIDODERM) 5 % Place 1 patch onto the skin daily. Remove & Discard patch within 12 hours or as directed by MD (Patient not taking: Reported on 02/26/2018) 30 patch 0 Not Taking at Unknown time  . lidocaine-prilocaine  (EMLA) cream Apply 1 application every Monday, Wednesday, and Friday topically. At dialysis  3 PRN at PRN  . LORazepam (ATIVAN) 2 MG tablet Take 1 tablet (2 mg total) by mouth at bedtime. (Patient not taking: Reported on 02/26/2018) 30 tablet 0 Not Taking at Unknown time  . metoprolol tartrate (LOPRESSOR) 25 MG tablet Take 0.5 tablets (12.5 mg total) by mouth 2 (two) times daily. (Patient not taking: Reported on 02/26/2018) 60 tablet 1 Not Taking at Unknown time  . midodrine (PROAMATINE) 10 MG tablet Take 1 tablet (10 mg total) by mouth daily. 30 tablet 1 Unknown at Unknown  . multivitamin (RENA-VIT) TABS tablet TAKE 1 TABLET BY MOUTH EVERYDAY AT BEDTIME (Patient taking differently: No sig reported) 30 tablet 0 Unknown at Unknown  . OLANZapine (ZYPREXA) 15 MG tablet Take 1 tablet (15 mg total) by mouth at bedtime. (Patient taking differently: Take 2.5 mg by mouth at bedtime. ) 30 tablet 1 Unknown at Unknown  . pantoprazole (PROTONIX) 40 MG tablet Take 40 mg by mouth every morning.   Unknown at Unknown  . rOPINIRole (REQUIP) 2 MG tablet Take 1 tablet (2 mg total) by mouth at bedtime. (Patient taking differently: Take 2 mg by mouth daily. ) 30 tablet 1 Unknown at Unknown  . timolol (TIMOPTIC) 0.5 % ophthalmic solution Place 1 drop into both eyes 2 (two) times daily. 10 mL 1 Unknown at Unknown    Patient Stressors: Financial difficulties Loss of independence Medication change or noncompliance  Patient Strengths: Average or above average intelligence Capable of independent living General fund of knowledge Supportive family/friends  Treatment Modalities: Medication Management, Group therapy, Case management,  1 to 1 session with clinician, Psychoeducation, Recreational therapy.   Physician Treatment Plan for Primary Diagnosis: <principal problem not specified> Long Term Goal(s): Improvement in symptoms so as ready for discharge Improvement in symptoms so as ready for discharge   Short Term Goals:  Ability to verbalize feelings will improve Ability to disclose and discuss suicidal ideas Ability to identify and develop effective coping behaviors will improve Compliance with prescribed medications will improve Ability to verbalize feelings will improve Ability to disclose and discuss suicidal ideas Compliance with prescribed medications will improve  Medication Management: Evaluate patient's response, side effects, and tolerance of medication regimen.  Therapeutic Interventions: 1 to 1 sessions, Unit Group sessions and Medication administration.  Evaluation of Outcomes: Progressing  Physician Treatment Plan for Secondary Diagnosis: Active Problems:   Bipolar I disorder, current or most recent episode manic, with psychotic features ([place])  Long Term Goal(s): Improvement in symptoms so as ready for discharge Improvement in symptoms so as ready for discharge   Short Term Goals: Ability to verbalize  feelings will improve Ability to disclose and discuss suicidal ideas Ability to identify and develop effective coping behaviors will improve Compliance with prescribed medications will improve Ability to verbalize feelings will improve Ability to disclose and discuss suicidal ideas Compliance with prescribed medications will improve     Medication Management: Evaluate patient's response, side effects, and tolerance of medication regimen.  Therapeutic Interventions: 1 to 1 sessions, Unit Group sessions and Medication administration.  Evaluation of Outcomes: Progressing   RN Treatment Plan for Primary Diagnosis: <principal problem not specified> Long Term Goal(s): Knowledge of disease and therapeutic regimen to maintain health will improve  Short Term Goals: Ability to identify and develop effective coping behaviors will improve and Compliance with prescribed medications will improve  Medication Management: RN will administer medications as ordered by provider, will assess and  evaluate patient's response and provide education to patient for prescribed medication. RN will report any adverse and/or side effects to prescribing provider.  Therapeutic Interventions: 1 on 1 counseling sessions, Psychoeducation, Medication administration, Evaluate responses to treatment, Monitor vital signs and CBGs as ordered, Perform/monitor CIWA, COWS, AIMS and Fall Risk screenings as ordered, Perform wound care treatments as ordered.  Evaluation of Outcomes: Progressing   LCSW Treatment Plan for Primary Diagnosis: <principal problem not specified> Long Term Goal(s): Safe transition to appropriate next level of care at discharge, Engage patient in therapeutic group addressing interpersonal concerns.  Short Term Goals: Engage patient in aftercare planning with referrals and resources, Increase social support, Identify triggers associated with mental health/substance abuse issues and Increase skills for wellness and recovery  Therapeutic Interventions: Assess for all discharge needs, 1 to 1 time with Social worker, Explore available resources and support systems, Assess for adequacy in community support network, Educate family and significant other(s) on suicide prevention, Complete Psychosocial Assessment, Interpersonal group therapy.  Evaluation of Outcomes: Progressing   Progress in Treatment: Attending groups: Yes. Participating in groups: Yes. Taking medication as prescribed: Yes. Toleration medication: Yes. Family/Significant other contact made: No, will contact:  Patients family members identified  Patient understands diagnosis: Yes. Discussing patient identified problems/goals with staff: Yes. Medical problems stabilized or resolved: Yes. Denies suicidal/homicidal ideation: Yes. Issues/concerns per patient self-inventory: No. Other:   New problem(s) identified: No, Describe:  None  New Short Term/Long Term Goal(s): Pt. in dialysis  Patient Goals:  Pt. in  disalysis  Discharge Plan or Barriers: To return home and follow up with outpatient provider.   Reason for Continuation of Hospitalization: Medication stabilization  Estimated Length of Stay: 3-5 days  Recreational Therapy: Patient Stressors: N/A  Patient Goal: Patient will engage in groups without prompting or encouragement from LRT x3 group sessions within 5 recreation therapy group sessions  Attendees: Patient: 03/01/2018 11:18 AM  Physician: Dr. Bary Leriche, MD 03/01/2018 11:18 AM  Nursing:  03/01/2018 11:18 AM  RN Care Manager: 03/01/2018 11:18 AM  Social Worker:  03/01/2018 11:18 AM  Recreational Therapist: Isaias Sakai. Marcello Fennel, LRT 03/01/2018 11:18 AM  Other: Darin Engels, LCSWA 03/01/2018 11:18 AM  Other: Alden Hipp, LCSW 03/01/2018 11:18 AM  Other:John [place] 03/01/2018 11:18 AM    Scribe for Treatment Team: Darin Engels, LCSW 03/01/2018 11:18 AM

## 2018-03-01 NOTE — Progress Notes (Signed)
Recreation Therapy Notes  Date: 03/01/2018  Time: 9:30 am  Location: Craft Room  Behavioral response: Appropriate  Intervention Topic: Problem Solving  Discussion/Intervention:  Group content on today was focused on problem solving. The group described what problem solving is. Patients expressed how problems affect them and how they deal with problems. Individuals identified healthy ways to deal with problems. Patients explained what normally happens to them when they do not deal with problems. The group expressed reoccurring problems for them. The group participated in the intervention "Ways to Solve problems" where patients were given a chance to explore different ways to solve problems.  Clinical Observations/Feedback:  Patient came to group and stated "it is important to know your support team when problem solving". She participated in the intervention and was social with peers and staff during group.    Kayode Petion LRT/CTRS         Tanya Crothers 03/01/2018 12:05 PM

## 2018-03-01 NOTE — Progress Notes (Signed)
Pre HD Treatment    03/01/18 1036  Vital Signs  Temp (!) 97.5 F (36.4 C)  Temp Source Oral  Pulse Rate 60  Pulse Rate Source Monitor  Resp 16  BP (!) 127/53  BP Location Right Arm  BP Method Automatic  Patient Position (if appropriate) Sitting  Oxygen Therapy  SpO2 99 %  O2 Device Room Air  Pain Assessment  Pain Scale 0-10  Pain Score 0  Dialysis Weight  Weight 68.5 kg  Type of Weight Pre-Dialysis  Time-Out for Hemodialysis  What Procedure? HD  Pt Identifiers(min of two) First/Last Name;MRN/Account#;Pt's DOB(use if MRN/Acct# not available  Correct Site? Yes  Correct Side? Yes  Correct Procedure? Yes  Consents Verified? Yes  Safety Precautions Reviewed? Yes  Engineer, civil (consulting) Number  ([id number])  Station Number 1  UF/Alarm Test Passed  Conductivity: Meter 14  Conductivity: Machine  14  pH 7.6  Reverse Osmosis Main  Normal Saline Lot Number [id number]  Dialyzer Lot Number [id number]  Disposable Set Lot Number [id number]  Machine Temperature 96.8 F (36 C)  Musician and Audible Yes  Blood Lines Intact and Secured Yes  Pre Treatment Patient Checks  Vascular access used during treatment Fistula  Patient is receiving dialysis in a chair Yes  Hepatitis B Surface Antigen Results Negative  Date Hepatitis B Surface Antigen Drawn 03/08/18  Hepatitis B Surface Antibody 17  Date Hepatitis B Surface Antibody Drawn 02/24/18  Hemodialysis Consent Verified Yes  Hemodialysis Standing Orders Initiated Yes  ECG (Telemetry) Monitor On Yes  Prime Ordered Normal Saline  Length of  DialysisTreatment -hour(s) 3.5 Hour(s)  Dialyzer Elisio 17H NR  Dialysate 3K, 2.5 Ca  Variable Sodium Other (Comment)  Dialysis Anticoagulant None  Dialysate Flow Ordered 600  Blood Flow Rate Ordered 400 mL/min  Ultrafiltration Goal 1.5 Liters  Dialysis Blood Pressure Support Ordered Normal Saline  Education / Care Plan  Dialysis Education Provided Yes  Documented Education in Care Plan Yes

## 2018-03-02 LAB — RENAL FUNCTION PANEL
ALBUMIN: 3.8 g/dL (ref 3.5–5.0)
ANION GAP: 9 (ref 5–15)
BUN: 30 mg/dL — ABNORMAL HIGH (ref 8–23)
CALCIUM: 9 mg/dL (ref 8.9–10.3)
CO2: 32 mmol/L (ref 22–32)
Chloride: 93 mmol/L — ABNORMAL LOW (ref 98–111)
Creatinine, Ser: 4.62 mg/dL — ABNORMAL HIGH (ref 0.44–1.00)
GFR, EST AFRICAN AMERICAN: 10 mL/min — AB (ref >60)
GFR, EST NON AFRICAN AMERICAN: 9 mL/min — AB (ref >60)
Glucose, Bld: 93 mg/dL (ref 70–99)
PHOSPHORUS: 4 mg/dL (ref 2.5–4.6)
POTASSIUM: 3.8 mmol/L (ref 3.5–5.1)
Sodium: 134 mmol/L — ABNORMAL LOW (ref 135–145)

## 2018-03-02 LAB — CBC
HEMATOCRIT: 30.8 % — AB (ref 35.0–47.0)
HEMOGLOBIN: 10.9 g/dL — AB (ref 12.0–16.0)
MCH: 34.1 pg — ABNORMAL HIGH (ref 26.0–34.0)
MCHC: 35.3 g/dL (ref 32.0–36.0)
MCV: 96.6 fL (ref 80.0–100.0)
Platelets: 133 10*3/uL — ABNORMAL LOW (ref 150–440)
RBC: 3.19 MIL/uL — ABNORMAL LOW (ref 3.80–5.20)
RDW: 13.5 % (ref 11.5–14.5)
WBC: 7.4 10*3/uL (ref 3.6–11.0)

## 2018-03-02 NOTE — Progress Notes (Signed)
D:Continue to work on Radiographer, therapeutic . Improved with  decision making skills  . Possible  discharge tomorrow , returning home to  her trailer. Compliant  with  medication, voice no concerns around sleep . Denies suicidal ideations , decreased  anxiety  Thrill and Burit  Heard and felt  .Stated appetite is good and energy level  [place]. Stated concentration is good . Denies suicidal  homicidal ideations  .  No auditory hallucinations  No pain concerns . Appropriate ADL'S. Interacting with peers and staff.  A: Encourage patient participation with unit programming . Instruction  Given on  Medication , verbalize understanding. R: Voice no other concerns. Staff continue to monitor

## 2018-03-02 NOTE — Progress Notes (Signed)
Recreation Therapy Notes          Joyce [name] 03/02/2018 1:50 PM

## 2018-03-02 NOTE — Plan of Care (Signed)
Resting and calm cooperative Bp. Is maintaining and stable, dialysis treatment is with out residual effect , patient is resting comfortable and medication compliant and sleeping long hours with out interruption . Denies SI/HI and signs of AVH, no distress , 15 minute safety checks is in progress.   Problem: Coping: Goal: Ability to identify and develop effective coping behavior will improve Outcome: Progressing Goal: Demonstration of participation in decision-making regarding own care will improve Outcome: Progressing   Problem: Health Behavior/Discharge Planning: Goal: Identification of resources available to assist in meeting health care needs will improve Outcome: Progressing   Problem: Education: Goal: Utilization of techniques to improve thought processes will improve Outcome: Progressing Goal: Knowledge of the prescribed therapeutic regimen will improve Outcome: Progressing   Problem: Activity: Goal: Interest or engagement in leisure activities will improve Outcome: Progressing Goal: Imbalance in normal sleep/wake cycle will improve Outcome: Progressing   Problem: Health Behavior/Discharge Planning: Goal: Ability to make decisions will improve Outcome: Progressing Goal: Compliance with therapeutic regimen will improve Outcome: Progressing   Problem: Safety: Goal: Ability to disclose and discuss suicidal ideas will improve Outcome: Progressing Goal: Ability to identify and utilize support systems that promote safety will improve Outcome: Progressing   Problem: Self-Concept: Goal: Ability to identify factors that promote anxiety will improve Outcome: Progressing Goal: Level of anxiety will decrease Outcome: Progressing Goal: Ability to modify response to factors that promote anxiety will improve Outcome: Progressing   Problem: Education: Goal: Ability to state activities that reduce stress will improve Outcome: Progressing   Problem: Education: Goal: Knowledge of  General Education information will improve Description Including pain rating scale, medication(s)/side effects and non-pharmacologic comfort measures Outcome: Progressing   Problem: Health Behavior/Discharge Planning: Goal: Ability to manage health-related needs will improve Outcome: Progressing   Problem: Clinical Measurements: Goal: Ability to maintain clinical measurements within normal limits will improve Outcome: Progressing Goal: Will remain free from infection Outcome: Progressing Goal: Diagnostic test results will improve Outcome: Progressing Goal: Respiratory complications will improve Outcome: Progressing Goal: Cardiovascular complication will be avoided Outcome: Progressing   Problem: Activity: Goal: Risk for activity intolerance will decrease Outcome: Progressing   Problem: Nutrition: Goal: Adequate nutrition will be maintained Outcome: Progressing   Problem: Coping: Goal: Level of anxiety will decrease Outcome: Progressing   Problem: Elimination: Goal: Will not experience complications related to bowel motility Outcome: Progressing Goal: Will not experience complications related to urinary retention Outcome: Progressing   Problem: Pain Managment: Goal: General experience of comfort will improve Outcome: Progressing   Problem: Safety: Goal: Ability to remain free from injury will improve Outcome: Progressing   Problem: Skin Integrity: Goal: Risk for impaired skin integrity will decrease Outcome: Progressing

## 2018-03-02 NOTE — Progress Notes (Signed)
Replacement fluid 250 ml given for low diastolic pressure, blood pressure recheck , normalize at 116/60 pulse 63.noted.

## 2018-03-02 NOTE — [facility] Group Notes (Signed)
CSW Group Therapy Note  03/02/2018  Time:  0900  Type of Therapy and Topic: Group Therapy: Goals Group: SMART Goals    Participation Level:  None    Description of Group:   The purpose of a daily goals group is to assist and guide patients in setting recovery/wellness-related goals. The objective is to set goals as they relate to the crisis in which they were admitted. Patients will be using SMART goal modalities to set measurable goals. Characteristics of realistic goals will be discussed and patients will be assisted in setting and processing how one will reach their goal. Facilitator will also assist patients in applying interventions and coping skills learned in psycho-education groups to the SMART goal and process how one will achieve defined goal.    Therapeutic Goals:  -Patients will develop and document one goal related to or their crisis in which brought them into treatment.  -Patients will be guided by LCSW using SMART goal setting modality in how to set a measurable, attainable, realistic and time sensitive goal.  -Patients will process barriers in reaching goal.  -Patients will process interventions in how to overcome and successful in reaching goal.    Patient's Goal:   Vaughan Basta attended group, and remained in the group room for the majority of the group; however, Ashely left before sharing her goal or participating in conversation with other group members about setting goals. Pt did not indicate why she was leaving group and did not return.    Therapeutic Modalities:  Motivational Interviewing  Cognitive Behavioral Therapy  Crisis Intervention Model  SMART goals setting  Alden Hipp, MSW, LCSW Clinical Social Worker 03/02/2018 10:12 AM

## 2018-03-02 NOTE — Progress Notes (Signed)
Recreation Therapy Notes  INPATIENT RECREATION THERAPY ASSESSMENT  Patient Details Name: Joyce [name] MRN: [id number] DOB: 12/26/1945 Today's Date: 03/02/2018       Information Obtained From: (Unable to complete assessment at this time)  Able to Participate in Assessment/Interview:    Patient Presentation:    Reason for Admission (Per Patient):    Patient Stressors:    Coping Skills:      Leisure Interests (2+):     Frequency of Recreation/Participation:    Awareness of Community Resources:     [employer]:     Current Use:    If no, Barriers?:    Expressed Interest in [place] of Residence:     Patient Main Form of Transportation:    Patient Strengths:     Patient Identified Areas of Improvement:     Patient Goal for Hospitalization:     Current SI (including self-harm):     Current HI:     Current AVH:    Staff Intervention Plan:    Consent to Intern Participation:    Darien Mignogna 03/02/2018, 2:20 PM

## 2018-03-02 NOTE — Progress Notes (Signed)
[facility] MD Progress Note  03/02/2018 10:21 AM Joyce [name]  MRN:  [id number]  Subjective:    [name] has no complaints. She tolerates medications well. No somatic complaints. She is up bit and still wanting to try to live independently.   Spoke with [name], her daughter in law, who does not support independent living and offers to take the patient back in return for complete submission. The son and his wife want to make all the decisions. The patient is a competent adult.  Tried to call her sister, who is the one who lives in the area and be helping the patient but unable to reach her.  Did peer-to-peer with [employer]. They do not feel she met admission criteria.  Principal Problem: Bipolar I disorder, current or most recent episode manic, with psychotic features ([place]) Diagnosis:   Patient Active Problem List   Diagnosis Date Noted  . Bipolar I disorder, current or most recent episode manic, with psychotic features ([place]) [F31.2] 02/26/2018    Priority: High  . Bipolar affective disorder, current episode depressed with psychotic symptoms ([place]) [F31.2] 09/17/2017    Priority: High  . Bipolar I disorder, most recent episode (or current) manic ([place]) [F31.10] 09/02/2017    Priority: High  . UTI (urinary tract infection) [N39.0] 12/16/2017  . Bipolar 1 disorder, depressed ([place]) [F31.9] 12/15/2017  . Palliative care encounter [Z51.5]   . Acute delirium [R41.0] 09/02/2017  . Altered mental status [R41.82]   . Acute encephalopathy [G93.40] 09/01/2017  . ESRD on dialysis ([place]) [N18.6, Z99.2] 04/06/2017  . Complication of vascular access for dialysis [T82.9XXA] 04/06/2017  . Hematuria [R31.9] 12/18/2016  . Polycystic kidney [Q61.3] 10/12/2016  . Ruptured cyst of kidney [Q61.00] 10/03/2016  . HTN (hypertension) [I10] 10/01/2016  . Chronic kidney disease [N18.9] 10/01/2016  . Anemia associated with chronic renal failure [N18.9, D63.1] 09/24/2016  . Acute kidney insufficiency [N28.9]  07/28/2016  . Noninfectious diarrhea [K52.9]   . Benign neoplasm of cecum [D12.0]   . Benign neoplasm of ascending colon [D12.2]   . Diarrhea [R19.7]   . Nausea [R11.0]   . Gastric polyp [K31.7]   . Chronic constipation [K59.09] 12/27/2014  . Nausea with vomiting [R11.2] 12/27/2014  . Left sided abdominal pain [R10.9] 12/27/2014  . Personal history of colonic polyps [Z86.010] 05/10/2013   Total Time spent with patient: 20 minutes  Past Psychiatric History: bipolar disorder  Past Medical History:  Past Medical History:  Diagnosis Date  . Anal fissure   . Bipolar affective disorder ([place])   . CKD (chronic kidney disease)    Dr Holley Raring [name] 4  . Colon polyps   . Diverticulitis   . Diverticulitis   . Family history of adverse reaction to anesthesia    mom - PONV  . GERD (gastroesophageal reflux disease)   . Headache    migraines - none over 10 yrs  . Heart murmur   . History of hiatal hernia   . Hypertension   . Pancreatitis    Valproic acid  . Vertigo     Past Surgical History:  Procedure Laterality Date  . A/V FISTULAGRAM Left 04/14/2017   Procedure: A/V Fistulagram;  Surgeon: Katha Cabal, MD;  Location: [place] [facility];  Service: Cardiovascular;  Laterality: Left;  . A/V FISTULAGRAM Left 06/09/2017   Procedure: A/V FISTULAGRAM;  Surgeon: Katha Cabal, MD;  Location: [place] [facility];  Service: Cardiovascular;  Laterality: Left;  . A/V FISTULAGRAM Left 11/02/2017   Procedure: A/V  FISTULAGRAM;  Surgeon: Algernon Huxley, MD;  Location: [place] [facility];  Service: Cardiovascular;  Laterality: Left;  . A/V SHUNT INTERVENTION N/A 11/02/2017   Procedure: A/V SHUNT INTERVENTION;  Surgeon: Algernon Huxley, MD;  Location: [place] [facility];  Service: Cardiovascular;  Laterality: N/A;  . ABDOMINAL HYSTERECTOMY  1990 ?  . AV FISTULA PLACEMENT  4/30  . BREAST EXCISIONAL BIOPSY Left 1994   neg surgical bx  . CHOLECYSTECTOMY  2003  . COLONOSCOPY  2014   Dr.  Jamal Collin  . COLONOSCOPY WITH PROPOFOL N/A 09/24/2015   Procedure: COLONOSCOPY WITH random colon byopies.;  Surgeon: Lucilla Lame, MD;  Location: [place];  Service: Endoscopy;  Laterality: N/A;  . ESOPHAGOGASTRODUODENOSCOPY (EGD) WITH PROPOFOL N/A 09/24/2015   Procedure: ESOPHAGOGASTRODUODENOSCOPY (EGD) ;  Surgeon: Lucilla Lame, MD;  Location: [place];  Service: Endoscopy;  Laterality: N/A;  . EYE SURGERY    . PERIPHERAL VASCULAR CATHETERIZATION N/A 05/29/2015   Procedure: A/V Shuntogram/Fistulagram;  Surgeon: Katha Cabal, MD;  Location: [place] [facility];  Service: Cardiovascular;  Laterality: N/A;  . PERIPHERAL VASCULAR CATHETERIZATION N/A 05/29/2015   Procedure: A/V Shunt Intervention;  Surgeon: Katha Cabal, MD;  Location: [place] [facility];  Service: Cardiovascular;  Laterality: N/A;  . POLYPECTOMY  09/24/2015   Procedure: POLYPECTOMY INTESTINAL;  Surgeon: Lucilla Lame, MD;  Location: [place];  Service: Endoscopy;;  cecal polyp ascending polyp   Family History:  Family History  Problem Relation Age of Onset  . Stroke Father   . Hypertension Father   . Breast cancer Other   . Colon cancer Neg Hx   . Liver disease Neg Hx    Family Psychiatric  History: none Social History:  Social History   Substance and Sexual Activity  Alcohol Use No  . Alcohol/week: 0.0 standard drinks     Social History   Substance and Sexual Activity  Drug Use No    Social History   Socioeconomic History  . Marital status: Single    Spouse name: Not on file  . Number of children: 1  . Years of education: Not on file  . Highest education level: Not on file  Occupational History  . Occupation: Surveyor, quantity: [employer]  Social Needs  . Financial resource strain: Not on file  . Food insecurity:    Worry: Not on file    Inability: Not on file  . Transportation needs:    Medical: Not on file    Non-medical: Not on file  Tobacco Use  . Smoking  status: Never Smoker  . Smokeless tobacco: Never Used  Substance and Sexual Activity  . Alcohol use: No    Alcohol/week: 0.0 standard drinks  . Drug use: No  . Sexual activity: Not Currently  Lifestyle  . Physical activity:    Days per week: Not on file    Minutes per session: Not on file  . Stress: Not on file  Relationships  . Social connections:    Talks on phone: Not on file    Gets together: Not on file    Attends religious service: Not on file    Active member of club or organization: Not on file    Attends meetings of clubs or organizations: Not on file    Relationship status: Not on file  Other Topics Concern  . 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   Additional Social History:                         Sleep: Fair  Appetite:  Fair  Current Medications: Current Facility-Administered Medications  Medication Dose Route Frequency Provider Last Rate Last Dose  . acetaminophen (TYLENOL) tablet 650 mg  650 mg Oral Q6H PRN Serafina Topham B, MD   650 mg at 03/02/18 0336  . alum & mag hydroxide-simeth (MAALOX/MYLANTA) 200-200-20 MG/5ML suspension 30 mL  30 mL Oral Q4H PRN Azra Abrell B, MD      . brimonidine (ALPHAGAN) 0.2 % ophthalmic solution 1 drop  1 drop Both Eyes BID Samayah Novinger B, MD   1 drop at 03/02/18 0813  . carbamazepine (TEGRETOL) tablet 200 mg  200 mg Oral TID Corazon Nickolas B, MD   200 mg at 03/02/18 0807  . Chlorhexidine Gluconate Cloth 2 % PADS 6 each  6 each Topical Q0600 Rayn Enderson B, MD      . dicyclomine (BENTYL) tablet 20 mg  20 mg Oral TID PRN Burel Kahre B, MD      . famotidine (PEPCID) tablet 20 mg  20 mg Oral Daily Narissa Beaufort B, MD   20 mg at 03/02/18 0806  . haloperidol (HALDOL) tablet 2.5 mg  2.5 mg Oral Q M,W,F-HD Crist Kruszka B, MD      . haloperidol (HALDOL) tablet 5 mg  5 mg Oral QHS  Dailan Pfalzgraf B, MD   5 mg at 03/01/18 2155  . lidocaine-prilocaine (EMLA) cream 1 application  1 application Topical Q M,W,F Caitlain Tweed B, MD   1 application at 79/39/03 1028  . magnesium hydroxide (MILK OF MAGNESIA) suspension 30 mL  30 mL Oral Daily PRN Avishai Reihl B, MD      . metoprolol tartrate (LOPRESSOR) tablet 25 mg  25 mg Oral BID Shatarra Wehling B, MD   25 mg at 03/02/18 0806  . multivitamin (RENA-VIT) tablet 1 tablet  1 tablet Oral Daily Bennye Nix B, MD   1 tablet at 03/02/18 0806  . rOPINIRole (REQUIP) tablet 2 mg  2 mg Oral QHS Chet Greenley B, MD   2 mg at 03/01/18 2155  . temazepam (RESTORIL) capsule 15 mg  15 mg Oral QHS PRN Aaniyah Strohm B, MD      . timolol (TIMOPTIC) 0.5 % ophthalmic solution 1 drop  1 drop Both Eyes BID Stela Iwasaki B, MD   1 drop at 03/02/18 0092    Lab Results: No results found for this or any previous visit (from the past 48 hour(s)).  Blood Alcohol level:  Lab Results  Component Value Date   ETH <10 02/25/2018   ETH <10 33/00/7622    Metabolic Disorder Labs: Lab Results  Component Value Date   HGBA1C 4.8 09/18/2017   MPG 91.06 09/18/2017   No results found for: PROLACTIN Lab Results  Component Value Date   CHOL 251 (H) 09/18/2017   TRIG 124 09/18/2017   HDL 66 09/18/2017   CHOLHDL 3.8 09/18/2017   VLDL 25 09/18/2017   LDLCALC 160 (H) 09/18/2017   LDLCALC 43 05/14/2014    Physical Findings: AIMS: Facial and Oral Movements Muscles of Facial Expression: None, normal Lips and Perioral Area: None, normal Jaw: None, normal Tongue: None, normal,Extremity Movements Upper (arms, wrists, hands, fingers): None, normal Lower (legs, knees, ankles, toes): None, normal, Trunk Movements Neck, shoulders, hips: None, normal, Overall Severity Severity of abnormal  movements (highest score from questions above): None, normal Incapacitation due to abnormal movements: None, normal Patient's  awareness of abnormal movements (rate only patient's report): No Awareness, Dental Status Current problems with teeth and/or dentures?: No Does patient usually wear dentures?: No  CIWA:  CIWA-Ar Total: 2 COWS:  COWS Total Score: 2  Musculoskeletal: Strength & Muscle Tone: within normal limits Gait & Station: normal Patient leans: N/A  Psychiatric Specialty Exam: Physical Exam  Nursing note and vitals reviewed. Psychiatric: She has a normal mood and affect. Her speech is normal and behavior is normal. Thought content normal. Cognition and memory are normal. She expresses impulsivity.    Review of Systems  Neurological: Negative.   Psychiatric/Behavioral: Negative.   All other systems reviewed and are negative.   Blood pressure 117/60, pulse 63, temperature 98.3 F (36.8 C), temperature source Oral, resp. rate 18, height _0  (1.6 m), weight 66.5 kg, SpO2 99 %.Body mass index is 25.97 kg/m.  General Appearance: Casual  Eye Contact:  Good  Speech:  Clear and Coherent  Volume:  Normal  Mood:  Euthymic  Affect:  Appropriate  Thought Process:  Goal Directed and Descriptions of Associations: Intact  Orientation:  Full (Time, Place, and Person)  Thought Content:  WDL  Suicidal Thoughts:  No  Homicidal Thoughts:  No  Memory:  Immediate;   Fair Recent;   Fair Remote;   Fair  Judgement:  Fair  Insight:  Fair  Psychomotor Activity:  Normal  Concentration:  Concentration: Fair and Attention Span: Fair  Recall:  [employer] of Knowledge:  Fair  Language:  Fair  Akathisia:  No  Handed:  Right  AIMS (if indicated):     Assets:  Communication Skills Desire for Improvement Financial Resources/Insurance Housing Resilience Social Support  ADL's:  Intact  Cognition:  WNL  Sleep:  Number of Hours: 6     Treatment Plan Summary: Daily contact with patient to assess and evaluate symptoms and progress in treatment and Medication management   [name] is a 69 y/owho was admitted  to psychiatry after she presented with manic symptoms at home in the context of being noncompliant with medications. She was restared on medications and tolerates them well.   #Bipolar disorder, most recent episode manic -continue Tegretol 200 mg TID, Tegretol level 9.6 -continue Haldol 5 mg nightly and 2.5 mg prior to hemodialysis on M/W/F -continue temazepam 15 mg nightly for insomnia  #CKD on hemodialysis -continue hemodialysis M/W/F, Cr 4.4 - continue Metoprolol 25 mg BID - Appreciateinput fromDr Kolloru from nephrology following the patent  #Restless Leg Syndrome -Continue Requip 2 mg po nightly  #Continue opthalmic eye drops  #Will consult [facility] to help with discharge planning and followup care  #Disposition  -We will need to have further discussions with the patient's son who is HCPOA and daughter-in-law with regards to the patient returning home. She may need placement -The patient is trying to return to see Dr. Thurmond Butts for psychotropic medication management.  Orson Slick, MD 03/02/2018, 10:21 AM

## 2018-03-02 NOTE — Plan of Care (Signed)
Continue to work on Radiographer, therapeutic . Improved with  decision making skills  . Possible  discharge tomorrow , returning home to  her trailer. Compliant  with  medication, voice no concerns around sleep . Denies suicidal ideations , decreased  anxiety   Problem: Coping: Goal: Ability to identify and develop effective coping behavior will improve 03/02/2018 1456 by Leodis Liverpool, RN Outcome: Progressing 03/02/2018 1430 by Leodis Liverpool, RN Outcome: Progressing Goal: Demonstration of participation in decision-making regarding own care will improve 03/02/2018 1456 by Leodis Liverpool, RN Outcome: Progressing 03/02/2018 1430 by Leodis Liverpool, RN Outcome: Progressing   Problem: Health Behavior/Discharge Planning: Goal: Identification of resources available to assist in meeting health care needs will improve 03/02/2018 1456 by Leodis Liverpool, RN Outcome: Progressing 03/02/2018 1430 by Leodis Liverpool, RN Outcome: Progressing   Problem: Education: Goal: Utilization of techniques to improve thought processes will improve 03/02/2018 1456 by Leodis Liverpool, RN Outcome: Progressing 03/02/2018 1430 by Leodis Liverpool, RN Outcome: Progressing Goal: Knowledge of the prescribed therapeutic regimen will improve 03/02/2018 1456 by Leodis Liverpool, RN Outcome: Progressing 03/02/2018 1430 by Leodis Liverpool, RN Outcome: Progressing   Problem: Activity: Goal: Interest or engagement in leisure activities will improve 03/02/2018 1456 by Leodis Liverpool, RN Outcome: Progressing 03/02/2018 1430 by Leodis Liverpool, RN Outcome: Progressing Goal: Imbalance in normal sleep/wake cycle will improve 03/02/2018 1456 by Leodis Liverpool, RN Outcome: Progressing 03/02/2018 1430 by Leodis Liverpool, RN Outcome: Progressing   Problem: Health Behavior/Discharge Planning: Goal: Ability to make decisions will improve 03/02/2018 1456 by Leodis Liverpool, RN Outcome: Progressing 03/02/2018 1430 by Leodis Liverpool,  RN Outcome: Progressing Goal: Compliance with therapeutic regimen will improve 03/02/2018 1456 by Leodis Liverpool, RN Outcome: Progressing 03/02/2018 1430 by Leodis Liverpool, RN Outcome: Progressing   Problem: Safety: Goal: Ability to disclose and discuss suicidal ideas will improve 03/02/2018 1456 by Leodis Liverpool, RN Outcome: Progressing 03/02/2018 1430 by Leodis Liverpool, RN Outcome: Progressing Goal: Ability to identify and utilize support systems that promote safety will improve 03/02/2018 1456 by Leodis Liverpool, RN Outcome: Progressing 03/02/2018 1430 by Leodis Liverpool, RN Outcome: Progressing   Problem: Self-Concept: Goal: Ability to identify factors that promote anxiety will improve 03/02/2018 1456 by Leodis Liverpool, RN Outcome: Progressing 03/02/2018 1430 by Leodis Liverpool, RN Outcome: Progressing Goal: Level of anxiety will decrease 03/02/2018 1456 by Leodis Liverpool, RN Outcome: Progressing 03/02/2018 1430 by Leodis Liverpool, RN Outcome: Progressing Goal: Ability to modify response to factors that promote anxiety will improve 03/02/2018 1456 by Leodis Liverpool, RN Outcome: Progressing 03/02/2018 1430 by Leodis Liverpool, RN Outcome: Progressing   Problem: Education: Goal: Ability to state activities that reduce stress will improve 03/02/2018 1456 by Leodis Liverpool, RN Outcome: Progressing 03/02/2018 1430 by Leodis Liverpool, RN Outcome: Progressing   With decisions

## 2018-03-02 NOTE — [facility] Group Notes (Signed)
03/02/2018 1PM  Type of Therapy/Topic:  Group Therapy:  Feelings about Diagnosis  Participation Level:  Active   Description of Group:   This group will allow patients to explore their thoughts and feelings about diagnoses they have received. Patients will be guided to explore their level of understanding and acceptance of these diagnoses. Facilitator will encourage patients to process their thoughts and feelings about the reactions of others to their diagnosis and will guide patients in identifying ways to discuss their diagnosis with significant others in their lives. This group will be process-oriented, with patients participating in exploration of their own experiences, giving and receiving support, and processing challenge from other group members.   Therapeutic Goals: 1. Patient will demonstrate understanding of diagnosis as evidenced by identifying two or more symptoms of the disorder 2. Patient will be able to express two feelings regarding the diagnosis 3. Patient will demonstrate their ability to communicate their needs through discussion and/or role play  Summary of Patient Progress: Actively and appropriately engaged in the group. Patient was able to provide support and validation to other group members.Patient practiced active listening when interacting with the facilitator and other group members Joyce [name] reports a symptom of feeling sad. She says "I get anxious about facing reality." Patient is still in the process of obtaining treatment goals.        Therapeutic Modalities:   Cognitive Behavioral Therapy Brief Therapy Feelings Identification    Darin Engels, [place] 03/02/2018 2:02 PM

## 2018-03-03 MED ORDER — TEMAZEPAM 15 MG PO CAPS: 15.0000 mg | ORAL_CAPSULE | Freq: Every evening | ORAL | 0 refills | Status: DC | PRN

## 2018-03-03 MED ORDER — ROPINIROLE HCL 2 MG PO TABS: 2.0000 mg | ORAL_TABLET | Freq: Every day | ORAL | 3 refills | Status: DC

## 2018-03-03 MED ORDER — CARBAMAZEPINE 200 MG PO TABS: 200.0000 mg | ORAL_TABLET | Freq: Three times a day (TID) | ORAL | 3 refills | Status: DC

## 2018-03-03 MED ORDER — HALOPERIDOL 5 MG PO TABS: ORAL_TABLET | ORAL | 3 refills | Status: DC

## 2018-03-03 MED ORDER — METOPROLOL TARTRATE 25 MG PO TABS: 25.0000 mg | ORAL_TABLET | Freq: Two times a day (BID) | ORAL | 1 refills | Status: DC

## 2018-03-03 NOTE — Progress Notes (Signed)
Recreation Therapy Notes   Date: 03/03/2018  Time: 9:30 am   Location: Craft Room   Behavioral response: N/A   Intervention Topic: Happiness  Discussion/Intervention: Patient did not attend group.   Clinical Observations/Feedback:  Patient did not attend group.   Shantai Tiedeman LRT/CTRS        Othelia Riederer 03/03/2018 1:43 PM

## 2018-03-03 NOTE — Progress Notes (Signed)
HD tx start    03/03/18 0919  Vital Signs  Pulse Rate (!) 59  Pulse Rate Source Monitor  Resp 14  BP (!) 122/48  BP Location Right Arm  BP Method Automatic  Patient Position (if appropriate) Lying  Oxygen Therapy  SpO2 99 %  O2 Device Room Air  During Hemodialysis Assessment  Blood Flow Rate (mL/min) 400 mL/min  Arterial Pressure (mmHg) -200 mmHg  Venous Pressure (mmHg) 180 mmHg  Transmembrane Pressure (mmHg) 80 mmHg  Ultrafiltration Rate (mL/min) 560 mL/min  Dialysate Flow Rate (mL/min) 800 ml/min  Conductivity: Machine  14.3  HD Safety Checks Performed Yes  Dialysis Fluid Bolus Normal Saline  Bolus Amount (mL) 250 mL  Intra-Hemodialysis Comments Tx initiated

## 2018-03-03 NOTE — Progress Notes (Signed)
Post HD assessment. PT tolerated tx well without c/o or complication. Net UF 1527, goal met.    03/03/18 1309  Vital Signs  Temp (!) 97.5 F (36.4 C)  Temp Source Oral  Pulse Rate 78  Pulse Rate Source Monitor  Resp 20  BP (!) 140/55  BP Location Right Arm  BP Method Automatic  Patient Position (if appropriate) Lying  Oxygen Therapy  SpO2 99 %  O2 Device Room Air  Dialysis Weight  Weight 67.3 kg  Type of Weight Post-Dialysis  Post-Hemodialysis Assessment  Rinseback Volume (mL) 250 mL  KECN 76.9 V  Dialyzer Clearance Lightly streaked  Duration of HD Treatment -hour(s) 3.5 hour(s)  Hemodialysis Intake (mL) 500 mL  UF Total -Machine (mL) 2027 mL  Net UF (mL) 1527 mL  Tolerated HD Treatment Yes  AVG/AVF Arterial Site Held (minutes) 10 minutes  AVG/AVF Venous Site Held (minutes) 10 minutes  Education / Care Plan  Dialysis Education Provided Yes  Documented Education in Care Plan Yes

## 2018-03-03 NOTE — Progress Notes (Signed)
  [facility] Adult Case Management Discharge Plan :  Will you be returning to the same living situation after discharge:  Yes,    At discharge, do you have transportation home?: Yes,    Do you have the ability to pay for your medications: Yes,     Release of information consent forms completed and in the chart;  Patient's signature needed at discharge.  Patient to Follow up at: Follow-up Information    P[facility], [employer]. Go to.   Why:  Please Walk in between 9am-4pm, Monday through Friday If you are not able to get in with Dr. Weber Cooks in the next week after discharge.  Appointments are first come first serve, please arrive early to minimize wait times. Contact information: [place] [address] [phone]        [place]. Schedule an appointment as soon as possible for a visit.   Specialty:  Behavioral Health Why:  Referral has been made for Hospital Follow up and ongoing medication management with Dr. Weber Cooks.  Should there be any difficulties in scheduling, please walk in to the Agency listed below to bridge the time between appointments. Contact information: [place] [phone]          Next level of care provider has access to [place] and Suicide Prevention discussed: Yes,     Have you used any form of tobacco in the last 30 days? (Cigarettes, Smokeless Tobacco, Cigars, and/or Pipes): No  Has patient been referred to the Quitline?: N/A patient is not a smoker  Patient has been referred for addiction treatment: Yes  August Saucer, LCSW 03/03/2018, 3:17 PM

## 2018-03-03 NOTE — Progress Notes (Signed)
D: Patient is aware of  Discharge this shift . [place] noted  Patient denies suicidal /homicidal ideations. Patient received all belongings brought in  A: No Storage medications. Writer reviewed Discharge Summary, Suicide Risk Assessment, and Transitional Record. Patient also received Prescriptions   from  MD Aware  Of follow up appointment . R: Patient left unit with no questions  Or concerns  With sister

## 2018-03-03 NOTE — Progress Notes (Signed)
Post HD assessment    03/03/18 1303  Neurological  Level of Consciousness Alert  Orientation Level Oriented X4  Respiratory  Respiratory Pattern Regular;Unlabored  Chest Assessment Chest expansion symmetrical  Cardiac  ECG Monitor Yes  Vascular  R Radial Pulse +2  L Radial Pulse +2  Integumentary  Integumentary (WDL) X  Skin Color Appropriate for ethnicity  Musculoskeletal  Musculoskeletal (WDL) X  Generalized Weakness Yes  Assistive Device Wheelchair  GU Assessment  Genitourinary (WDL) X  Genitourinary Symptoms  (HD)  Psychosocial  Psychosocial (WDL) WDL  Patient Behaviors Cooperative;Calm;Appropriate for situation
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
## 2018-03-03 NOTE — Progress Notes (Signed)
Pre HD assessment    03/03/18 0901  Vital Signs  Temp 97.6 F (36.4 C)  Temp Source Oral  Pulse Rate 60  Pulse Rate Source Monitor  Resp (!) 21  BP (!) 126/52  BP Location Right Arm  BP Method Automatic  Patient Position (if appropriate) Sitting  Oxygen Therapy  SpO2 97 %  O2 Device Room Air  Pain Assessment  Pain Scale 0-10  Pain Score 0  Dialysis Weight  Weight 68.8 kg  Type of Weight Pre-Dialysis  Time-Out for Hemodialysis  What Procedure? HD  Pt Identifiers(min of two) First/Last Name;MRN/Account#  Correct Site? Yes  Correct Side? Yes  Correct Procedure? Yes  Consents Verified? Yes  Rad Studies Available? N/A  Safety Precautions Reviewed? Yes  Engineer, civil (consulting) Number  ([id number])  Station Number 1  UF/Alarm Test Passed  Conductivity: Meter 14  Conductivity: Machine  14.3  pH 7.4  Reverse Osmosis main  Normal Saline Lot Number [id number]  Dialyzer Lot Number [id number]  Disposable Set Lot Number [id number]  Machine Temperature 97.7 F (36.5 C)  Musician and Audible Yes  Blood Lines Intact and Secured Yes  Pre Treatment Patient Checks  Vascular access used during treatment Fistula  Patient is receiving dialysis in a chair Yes  Hepatitis B Surface Antigen Results Negative  Date Hepatitis B Surface Antigen Drawn 01/06/18  Hepatitis B Surface Antibody  (>10)  Date Hepatitis B Surface Antibody Drawn 02/24/18  Hemodialysis Consent Verified Yes  Hemodialysis Standing Orders Initiated Yes  ECG (Telemetry) Monitor On Yes  Prime Ordered Normal Saline  Length of  DialysisTreatment -hour(s) 3.5 Hour(s)  Dialyzer Elisio 17H NR  Dialysate 3K, 2.5 Ca  Dialysis Anticoagulant None  Dialysate Flow Ordered 800  Blood Flow Rate Ordered 400 mL/min  Ultrafiltration Goal 1.5 Liters  Dialysis Blood Pressure Support Ordered Normal Saline  Education / Care Plan  Dialysis Education Provided Yes  Documented Education in Care Plan Yes

## 2018-03-03 NOTE — Progress Notes (Deleted)
Pre HD assessment  

## 2018-03-03 NOTE — Progress Notes (Signed)
Recreation Therapy Notes  INPATIENT RECREATION TR PLAN  Patient Details Name: Joyce [name] MRN: [id number] DOB: 1945/10/06 Today's Date: 03/03/2018  Rec Therapy Plan Is patient appropriate for Therapeutic Recreation?: Yes Treatment times per week: at least 3 Estimated Length of Stay: 5-7 days TR Treatment/Interventions: Group participation (Comment)  Discharge Criteria Pt will be discharged from therapy if:: Discharged Treatment plan/goals/alternatives discussed and agreed upon by:: Patient/family  Discharge Summary Short term goals set: Patient will engage in groups without prompting or encouragement from LRT x3 group sessions within 5 recreation therapy group sessions Short term goals met: Adequate for discharge Progress toward goals comments: Groups attended Which groups?: (Problem Solving) Reason goals not met: N/A Therapeutic equipment acquired: N/A Reason patient discharged from therapy: Discharge from hospital Pt/family agrees with progress & goals achieved: Yes Date patient discharged from therapy: 03/03/18   Mirinda Monte 03/03/2018, 2:15 PM

## 2018-03-03 NOTE — Progress Notes (Signed)
Pre HD assessment    03/03/18 0903  Neurological  Level of Consciousness Alert  Orientation Level Oriented X4  Respiratory  Respiratory Pattern Regular;Unlabored  Chest Assessment Chest expansion symmetrical  Cardiac  ECG Monitor Yes  Vascular  R Radial Pulse +2  L Radial Pulse +2  Integumentary  Integumentary (WDL) X  Skin Color Appropriate for ethnicity  Musculoskeletal  Musculoskeletal (WDL) X  Generalized Weakness Yes  Assistive Device Wheelchair  GU Assessment  Genitourinary (WDL) X  Genitourinary Symptoms  (HD)  Psychosocial  Psychosocial (WDL) WDL  Patient Behaviors Calm;Cooperative;Appropriate for situation

## 2018-03-03 NOTE — Progress Notes (Signed)
HD tx end   03/03/18 1301  Vital Signs  Pulse Rate 72  Pulse Rate Source Monitor  Resp 16  BP (!) 160/63  BP Location Right Arm  BP Method Automatic  Patient Position (if appropriate) Lying  Oxygen Therapy  SpO2 99 %  O2 Device Room Air  During Hemodialysis Assessment  Dialysis Fluid Bolus Normal Saline  Bolus Amount (mL) 250 mL  Intra-Hemodialysis Comments Tx completed

## 2018-03-03 NOTE — Progress Notes (Signed)
[facility]  ROUNDING NOTE   Subjective:     HEMODIALYSIS FLOWSHEET:  Blood Flow Rate (mL/min): 400 mL/min Arterial Pressure (mmHg): -230 mmHg Venous Pressure (mmHg): 230 mmHg Transmembrane Pressure (mmHg): 80 mmHg Ultrafiltration Rate (mL/min): 570 mL/min Dialysate Flow Rate (mL/min): 800 ml/min Conductivity: Machine : 14.3 Conductivity: Machine : 14.3 Dialysis Fluid Bolus: Normal Saline Bolus Amount (mL): 250 mL  Feels well at present No c/o  Objective:  Vital signs in last 24 hours:  Temp:  [97.5 F (36.4 C)-97.6 F (36.4 C)] 97.5 F (36.4 C) (08/14 1309) Pulse Rate:  [56-78] 72 (08/14 1407) Resp:  [13-21] 20 (08/14 1309) BP: (122-173)/(39-117) 159/59 (08/14 1407) SpO2:  [97 %-100 %] 99 % (08/14 1407) Weight:  [67.3 kg-68.8 kg] 67.3 kg (08/14 1309)  Weight change:  Filed Weights   03/01/18 1456 03/03/18 0901 03/03/18 1309  Weight: 66.5 kg 68.8 kg 67.3 kg    Intake/Output: I/O last 3 completed shifts: In: 720 [P.O.:720] Out: -    Intake/Output this shift:  Total I/O In: 480 [P.O.:480] Out: 1527 [Other:1527]  Physical Exam: General: NAD, sitting up in chair  Head: Normocephalic, atraumatic. Moist oral mucosal membranes  Eyes: Anicteric,  Neck: Supple,   Lungs:  Clear to auscultation  Heart: Regular rate and rhythm  Abdomen:  Soft, nontender,   Extremities:  no peripheral edema.  Neurologic:  Alert, able to answer questions appropriately  Skin: No lesions  Access: Left AVF    Basic Metabolic Panel: Recent Labs  Lab 02/25/18 1705 03/02/18 1013  NA 141 134*  K 3.6 3.8  CL 101 93*  CO2 28 32  GLUCOSE 119* 93  BUN 20 30*  CREATININE 4.40* 4.62*  CALCIUM 9.4 9.0  PHOS  --  4.0    Liver Function Tests: Recent Labs  Lab 02/25/18 1705 03/02/18 1013  AST 22  --   ALT 15  --   ALKPHOS 68  --   BILITOT 0.6  --   PROT 7.1  --   ALBUMIN 4.2 3.8   No results for input(s): LIPASE, AMYLASE in the last 168 hours. No results for  input(s): AMMONIA in the last 168 hours.  CBC: Recent Labs  Lab 02/25/18 1705 03/02/18 1013  WBC 8.2 7.4  HGB 12.9 10.9*  HCT 36.9 30.8*  MCV 97.6 96.6  PLT 205 133*    Cardiac Enzymes: No results for input(s): CKTOTAL, CKMB, CKMBINDEX, TROPONINI in the last 168 hours.  BNP: Invalid input(s): POCBNP  CBG: No results for input(s): GLUCAP in the last 168 hours.  Microbiology: Results for orders placed or performed during the hospital encounter of 09/01/17  Urine culture     Status: Abnormal   Collection Time: 09/01/17  4:32 PM  Result Value Ref Range Status   Specimen Description   Final    URINE, RANDOM Performed at [facility], [address] [place], [place] [address]    Special Requests   Final    NONE Performed at [facility], [address] [place], [place] [address]    Culture MULTIPLE SPECIES PRESENT, SUGGEST RECOLLECTION (A)  Final   Report Status 09/03/2017 FINAL  Final  Culture, blood (routine x 2)     Status: None   Collection Time: 09/01/17  6:05 PM  Result Value Ref Range Status   Specimen Description BLOOD RIGHT HAND  Final   Special Requests   Final    BOTTLES DRAWN AEROBIC AND ANAEROBIC Blood Culture results may not be  optimal due to an excessive volume of blood received in culture bottles   Culture   Final    NO GROWTH 5 DAYS Performed at [facility], [address] [place], [place] [address]    Report Status 09/06/2017 FINAL  Final  Culture, blood (routine x 2)     Status: None   Collection Time: 09/01/17  6:34 PM  Result Value Ref Range Status   Specimen Description BLOOD RAC  Final   Special Requests   Final    BOTTLES DRAWN AEROBIC AND ANAEROBIC Blood Culture adequate volume   Culture   Final    NO GROWTH 5 DAYS Performed at [facility], [address] [place], [place] [address]    Report Status 09/06/2017 FINAL  Final    Coagulation Studies: No results for input(s): LABPROT, INR in the  last 72 hours.  Urinalysis: No results for input(s): COLORURINE, LABSPEC, PHURINE, GLUCOSEU, HGBUR, BILIRUBINUR, KETONESUR, PROTEINUR, UROBILINOGEN, NITRITE, LEUKOCYTESUR in the last 72 hours.  Invalid input(s): APPERANCEUR    Imaging: No results found.   Medications:    . brimonidine  1 drop Both Eyes BID  . carbamazepine  200 mg Oral TID  . Chlorhexidine Gluconate Cloth  6 each Topical Q0600  . famotidine  20 mg Oral Daily  . haloperidol  2.5 mg Oral Q M,W,F-HD  . haloperidol  5 mg Oral QHS  . lidocaine-prilocaine  1 application Topical Q M,W,F  . metoprolol tartrate  25 mg Oral BID  . multivitamin  1 tablet Oral Daily  . rOPINIRole  2 mg Oral QHS  . timolol  1 drop Both Eyes BID   acetaminophen, alum & mag hydroxide-simeth, dicyclomine, magnesium hydroxide, temazepam  Assessment/ Plan:  [name] is a 72 y.o. white female with end stage renal disease on hemodialysis, hypertension, bipolar disorder, GERD, polycystic kidney disease  [facility] MWF left AVF  1. End Stage Renal Disease  Patient seen during dialysis Tolerating well  Next HD Friday Patient requesting transfer to [facility]- in process  2. Hypertension: blood pressure at goal.  - metoprolol  3. Anemia of chronic kidney disease: hemoglobin 10.9 - EPO as outpatient.    4. Secondary Hyperparathyroidism: calcium at goal.  Not currently taking binders.      LOS: 5 Joyce [name] 8/14/20193:06 PM

## 2018-03-03 NOTE — [facility] Suicide Risk Assessment (Signed)
[facility] Discharge Suicide Risk Assessment   Principal Problem: Bipolar I disorder, current or most recent episode manic, with psychotic features [facility]) Discharge Diagnoses:  Patient Active Problem List   Diagnosis Date Noted  . Bipolar I disorder, current or most recent episode manic, with psychotic features ([place]) [F31.2] 02/26/2018    Priority: High  . Bipolar affective disorder, current episode depressed with psychotic symptoms ([place]) [F31.2] 09/17/2017    Priority: High  . Bipolar I disorder, most recent episode (or current) manic ([place]) [F31.10] 09/02/2017    Priority: High  . UTI (urinary tract infection) [N39.0] 12/16/2017  . Bipolar 1 disorder, depressed ([place]) [F31.9] 12/15/2017  . Palliative care encounter [Z51.5]   . Acute delirium [R41.0] 09/02/2017  . Altered mental status [R41.82]   . Acute encephalopathy [G93.40] 09/01/2017  . ESRD on dialysis ([place]) [N18.6, Z99.2] 04/06/2017  . Complication of vascular access for dialysis [T82.9XXA] 04/06/2017  . Hematuria [R31.9] 12/18/2016  . Polycystic kidney [Q61.3] 10/12/2016  . Ruptured cyst of kidney [Q61.00] 10/03/2016  . HTN (hypertension) [I10] 10/01/2016  . Chronic kidney disease [N18.9] 10/01/2016  . Anemia associated with chronic renal failure [N18.9, D63.1] 09/24/2016  . Acute kidney insufficiency [N28.9] 07/28/2016  . Noninfectious diarrhea [K52.9]   . Benign neoplasm of cecum [D12.0]   . Benign neoplasm of ascending colon [D12.2]   . Diarrhea [R19.7]   . Nausea [R11.0]   . Gastric polyp [K31.7]   . Chronic constipation [K59.09] 12/27/2014  . Nausea with vomiting [R11.2] 12/27/2014  . Left sided abdominal pain [R10.9] 12/27/2014  . Personal history of colonic polyps [Z86.010] 05/10/2013    Total Time spent with patient: 20 minutes  Musculoskeletal: Strength & Muscle Tone: within normal limits Gait & Station: normal Patient leans: N/A  Psychiatric Specialty Exam: Review of Systems  Neurological: Negative.    Psychiatric/Behavioral: Negative.   All other systems reviewed and are negative.   Blood pressure (!) 127/39, pulse 61, temperature (!) 97.5 F (36.4 C), temperature source Oral, resp. rate 16, height 5\' 3"  (1.6 m), weight 66.5 kg, SpO2 100 %.Body mass index is 25.97 kg/m.  General Appearance: Casual  Eye Contact::  Good  Speech:  Clear and Coherent409  Volume:  Normal  Mood:  Euthymic  Affect:  Appropriate  Thought Process:  Goal Directed and Descriptions of Associations: Intact  Orientation:  Full (Time, Place, and Person)  Thought Content:  WDL  Suicidal Thoughts:  No  Homicidal Thoughts:  No  Memory:  Immediate;   Fair Recent;   Fair Remote;   Fair  Judgement:  Fair  Insight:  Fair  Psychomotor Activity:  Normal  Concentration:  Fair  Recall:  [employer] of Knowledge:Fair  Language: Fair  Akathisia:  No  Handed:  Right  AIMS (if indicated):     Assets:  Communication Skills Desire for Improvement Financial Resources/Insurance Housing Resilience Social Support Transportation  Sleep:  Number of Hours: 7.5  Cognition: WNL  ADL's:  Intact   Mental Status Per Nursing Assessment::   On Admission:  NA  Demographic Factors:  Age 72 or older, Divorced or widowed, Caucasian and Living alone  Loss Factors: Loss of significant relationship and Decline in physical health  Historical Factors: Impulsivity  Risk Reduction Factors:   Sense of responsibility to family, Religious beliefs about death, Positive social support and Positive therapeutic relationship  Continued Clinical Symptoms:  Bipolar Disorder:   Mixed State Previous Psychiatric Diagnoses and Treatments Medical Diagnoses and Treatments/Surgeries  Cognitive Features That Contribute  To Risk:  None    Suicide Risk:  Minimal: No identifiable suicidal ideation.  Patients presenting with no risk factors but with morbid ruminations; may be classified as minimal risk based on the severity of the depressive  symptoms    Plan Of Care/Follow-up recommendations:  Activity:  as tolerated Diet:  renal diet Other:  keep follow up appointments  Orson Slick, MD 03/03/2018, 8:28 AM

## 2018-03-03 NOTE — Discharge Summary (Addendum)
Physician Discharge Summary Note  Patient:  Joyce [name] is a 72 y.o., female MRN:  [id number] DOB:  02-27-1946 Patient phone:  [phone] (home)  Patient address:   [place] [address],  Total Time spent with patient: 20 minutes plus 15 min on care coordination and documantation.  Date of Admission:  02/26/2018 Date of Discharge: 03/03/2018  Reason for Admission:  Manic episode.  History of Present Illness:   Ms. Macbride is a 72 year old divorced Caucasian female with a prior diagnosis of bipolar disorder, recent diagnosis dementia the patient has become more physically as well as chronic kidney disease on hemodialysis who presented to the emergency room secondary to mood instability. The patient had been noncompliant with medications at home per her family.  The patient has become more physically aggressive with her family.  She was receiving psychotropic medications from Dr. Thurmond Butts but her daughter-in-law recently switched her to a physician in [place]. The new medications given to her were not working well and she decompensated.  Patient saw the telemetry psychiatrist in the emergency room who reported that she was calm and cooperative.  She was hyperverbal however and somewhat paranoid and delusional about her daughter-in-law.  She reported being "very happy".  She also reported insomnia but no spending sprees, gambling, hyperreligious thoughts or hypersexual behavior.  Appetite has been poor.  Patient has been lashing out at family in a paranoid way because she was told that she cannot return home.  She frequently threatens to take a taxi back home from dialysis.  She has also been threatening not to go to dialysis which she has been compliant with in the past.  The patient herself denies feeling depressed and denies any feelings of hopelessness or anhedonia. ER consult indicated some passive suicidal thoughts but the patient denies any suicidal ideation after  admission.  She wants to be able to live with her son and daughter-in-law not go to a nursing facility or group home.  She denies any current active or passive suicidal thoughts.  She denies any auditory or visual hallucinations.  She has little insight into paranoid thoughts.  No history of any heavy alcohol use or illicit drug use.  Past psychiatric history. Long history of bipolar with several hospitalization sand many medication trials. She has been in the care of Dr. Lanetta Inch in the past and has had ECT.  She has a history of one overdose attempt in the past over 20 years ago.  Family psychiatric history: None  Social history. The patient was born and raised in person [place] by both her biological parents.  She graduated from high school and did some college.  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  The patient divorced in 55 and has 1 son and 1 daughter-in-law.  She currently lives with her son and daughter-in-law in [place].  Substance Abuse History: No history of any heavy alcohol use or illicit drug use  Legal history. She denies an+y prior arrest or incarcerations.  Associated Signs/Symptoms: Depression Symptoms:  none (Hypo) Manic Symptoms:  Impulsivity, Labiality of Mood, Anxiety Symptoms:  Anxiety present Psychotic Symptoms:  Paranoia, PTSD Symptoms: Negative  Principal Problem: Bipolar I disorder, current or most recent episode manic, with psychotic features [facility]) Discharge Diagnoses: Patient Active Problem List   Diagnosis Date Noted  . Bipolar I disorder, current or most recent episode manic, with psychotic features ([place]) [F31.2] 02/26/2018    Priority: High  . Bipolar affective  disorder, current episode depressed with psychotic symptoms ([place]) [F31.2] 09/17/2017    Priority: High  . Bipolar I disorder, most recent episode (or current) manic ([place]) [F31.10] 09/02/2017    Priority: High  . UTI (urinary tract infection) [N39.0] 12/16/2017  .  Bipolar 1 disorder, depressed ([place]) [F31.9] 12/15/2017  . Palliative care encounter [Z51.5]   . Acute delirium [R41.0] 09/02/2017  . Altered mental status [R41.82]   . Acute encephalopathy [G93.40] 09/01/2017  . ESRD on dialysis ([name]) [N18.6, Z99.2] 04/06/2017  . Complication of vascular access for dialysis [T82.9XXA] 04/06/2017  . Hematuria [R31.9] 12/18/2016  . Polycystic kidney [Q61.3] 10/12/2016  . Ruptured cyst of kidney [Q61.00] 10/03/2016  . HTN (hypertension) [I10] 10/01/2016  . Chronic kidney disease [N18.9] 10/01/2016  . Anemia associated with chronic renal failure [N18.9, D63.1] 09/24/2016  . Acute kidney insufficiency [N28.9] 07/28/2016  . Noninfectious diarrhea [K52.9]   . Benign neoplasm of cecum [D12.0]   . Benign neoplasm of ascending colon [D12.2]   . Diarrhea [R19.7]   . Nausea [R11.0]   . Gastric polyp [K31.7]   . Chronic constipation [K59.09] 12/27/2014  . Nausea with vomiting [R11.2] 12/27/2014  . Left sided abdominal pain [R10.9] 12/27/2014  . Personal history of colonic polyps [Z86.010] 05/10/2013   Past Medical History:  Past Medical History:  Diagnosis Date  . Anal fissure   . Bipolar affective disorder ([place])   . CKD (chronic kidney disease)    Dr Holley Raring [name] 4  . Colon polyps   . Diverticulitis   . Diverticulitis   . Family history of adverse reaction to anesthesia    mom - PONV  . GERD (gastroesophageal reflux disease)   . Headache    migraines - none over 10 yrs  . Heart murmur   . History of hiatal hernia   . Hypertension   . Pancreatitis    Valproic acid  . Vertigo     Past Surgical History:  Procedure Laterality Date  . A/V FISTULAGRAM Left 04/14/2017   Procedure: A/V Fistulagram;  Surgeon: Katha Cabal, MD;  Location: [facility];  Service: Cardiovascular;  Laterality: Left;  . A/V FISTULAGRAM Left 06/09/2017   Procedure: A/V FISTULAGRAM;  Surgeon: Katha Cabal, MD;  Location: [facility];  Service:  Cardiovascular;  Laterality: Left;  . A/V FISTULAGRAM Left 11/02/2017   Procedure: A/V FISTULAGRAM;  Surgeon: Algernon Huxley, MD;  Location: [facility];  Service: Cardiovascular;  Laterality: Left;  . A/V SHUNT INTERVENTION N/A 11/02/2017   Procedure: A/V SHUNT INTERVENTION;  Surgeon: Algernon Huxley, MD;  Location: [facility];  Service: Cardiovascular;  Laterality: N/A;  . ABDOMINAL HYSTERECTOMY  1990 ?  . AV FISTULA PLACEMENT  4/30  . BREAST EXCISIONAL BIOPSY Left 1994   neg surgical bx  . CHOLECYSTECTOMY  2003  . COLONOSCOPY  2014   Dr. Jamal Collin  . COLONOSCOPY WITH PROPOFOL N/A 09/24/2015   Procedure: COLONOSCOPY WITH random colon byopies.;  Surgeon: Lucilla Lame, MD;  Location: [facility];  Service: Endoscopy;  Laterality: N/A;  . ESOPHAGOGASTRODUODENOSCOPY (EGD) WITH PROPOFOL N/A 09/24/2015   Procedure: ESOPHAGOGASTRODUODENOSCOPY (EGD) ;  Surgeon: Lucilla Lame, MD;  Location: [facility];  Service: Endoscopy;  Laterality: N/A;  . EYE SURGERY    . PERIPHERAL VASCULAR CATHETERIZATION N/A 05/29/2015   Procedure: A/V Shuntogram/Fistulagram;  Surgeon: Katha Cabal, MD;  Location: [facility];  Service: Cardiovascular;  Laterality: N/A;  . PERIPHERAL VASCULAR CATHETERIZATION N/A  05/29/2015   Procedure: A/V Shunt Intervention;  Surgeon: Katha Cabal, MD;  Location: [facility];  Service: Cardiovascular;  Laterality: N/A;  . POLYPECTOMY  09/24/2015   Procedure: POLYPECTOMY INTESTINAL;  Surgeon: Lucilla Lame, MD;  Location: [place];  Service: Endoscopy;;  cecal polyp ascending polyp   Family History:  Family History  Problem Relation Age of Onset  . Stroke Father   . Hypertension Father   . Breast cancer Other   . Colon cancer Neg Hx   . Liver disease Neg Hx     Social History:  Social History   Substance and Sexual Activity  Alcohol Use No  . Alcohol/week: 0.0 standard drinks     Social History   Substance and Sexual  Activity  Drug Use No    Social History   Socioeconomic History  . Marital status: Single    Spouse name: Not on file  . Number of children: 1  . Years of education: Not on file  . Highest education level: Not on file  Occupational History  . Occupation: Surveyor, quantity: [employer]  Social Needs  . Financial resource strain: Not on file  . Food insecurity:    Worry: Not on file    Inability: Not on file  . Transportation needs:    Medical: Not on file    Non-medical: Not on file  Tobacco Use  . Smoking status: Never Smoker  . Smokeless tobacco: Never Used  Substance and Sexual Activity  . Alcohol use: No    Alcohol/week: 0.0 standard drinks  . Drug use: No  . Sexual activity: Not Currently  Lifestyle  . Physical activity:    Days per week: Not on file    Minutes per session: Not on file  . Stress: Not on file  Relationships  . Social connections:    Talks on phone: Not on file    Gets together: Not on file    Attends religious service: Not on file    Active member of club or organization: Not on file    Attends meetings of clubs or organizations: Not on file    Relationship status: Not on file  Other Topics Concern  . 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    Hospital Course:    Ms. Borden is a 74 y/ofemale who was admitted to psychiatry after she presented with manic symptoms at home in the context of being noncompliant with medications. She was restared on medications and tolerates them well.Paranoia and agitation resolved. There were no behavioral problems on the unit. She accepted dialysis without difficulties. Given family discord, the patient wanted to return to her house to live independently. She feels ready and the treatment team agrees. She is in good spirits, compliant with treatment. There ar no cognitive difficulties since her depression is under  control.  Spoke with her sister who supports the plan. She will pick the patient up from the hospital and will provide limited assistance.  Met with her son and daughter in law who, reluctantly, support tthe plan. They already mover patient belongings from their house to [place]. They return her house and car keys as well as her wallet.  #Bipolar disorder, most recent episode manic -continue Tegretol 200 mg TID,Tegretol level 9.6 -continue Haldol 5 mg nightly and 2.5 mg prior to hemodialysis on M/W/F -continue temazepam 15 mg  nightly for insomnia  #CKD on hemodialysis -continue hemodialysis M/W/F, Cr 4.4 -continue Metoprolol 25 mg BID - Appreciateinput fromDr Kolloru from nephrology following the patent  #Restless Leg Syndrome -Continue Requip 2 mg po nightly  #Continue opthalmic eye drops  #Disposition  -discharge to her own place -follow up with Dr. Weber Cooks at [facility]  -follow up with her regular nephrologist for dialysis -follow up with Dr. Lavera Guise for medical needs -unfortunately, her Medicare will not pay for home health services -we encouraged the patient to apply for Medicaid  Physical Findings: AIMS: Facial and Oral Movements Muscles of Facial Expression: None, normal Lips and Perioral Area: None, normal Jaw: None, normal Tongue: None, normal,Extremity Movements Upper (arms, wrists, hands, fingers): None, normal Lower (legs, knees, ankles, toes): None, normal, Trunk Movements Neck, shoulders, hips: None, normal, Overall Severity Severity of abnormal movements (highest score from questions above): None, normal Incapacitation due to abnormal movements: None, normal Patient's awareness of abnormal movements (rate only patient's report): No Awareness, Dental Status Current problems with teeth and/or dentures?: No Does patient usually wear dentures?: No  CIWA:  CIWA-Ar Total: 2 COWS:  COWS Total Score: 2  Musculoskeletal: Strength & Muscle Tone: within normal  limits Gait & Station: normal Patient leans: N/A  Psychiatric Specialty Exam: Physical Exam  Nursing note and vitals reviewed. Psychiatric: She has a normal mood and affect. Her speech is normal and behavior is normal. Thought content normal. Cognition and memory are normal. She expresses impulsivity.    Review of Systems  Neurological: Negative.   Psychiatric/Behavioral: Negative.   All other systems reviewed and are negative.   Blood pressure (!) 125/50, pulse (!) 56, temperature 97.6 F (36.4 C), temperature source Oral, resp. rate 14, height '5\' 3"'$  (1.6 m), weight 68.8 kg, SpO2 100 %.Body mass index is 26.87 kg/m.  General Appearance: Casual  Eye Contact:  Good  Speech:  Clear and Coherent  Volume:  Normal  Mood:  Euthymic  Affect:  Appropriate  Thought Process:  Goal Directed and Descriptions of Associations: Intact  Orientation:  Full (Time, Place, and Person)  Thought Content:  WDL  Suicidal Thoughts:  No  Homicidal Thoughts:  No  Memory:  Immediate;   Fair Recent;   Fair Remote;   Fair  Judgement:  Fair  Insight:  Present  Psychomotor Activity:  Normal  Concentration:  Concentration: Fair and Attention Span: Fair  Recall:  [employer] of Knowledge:  Fair  Language:  Fair  Akathisia:  No  Handed:  Right  AIMS (if indicated):     Assets:  Communication Skills Desire for Improvement Financial Resources/Insurance Housing Resilience Social Support Transportation  ADL's:  Intact  Cognition:  WNL  Sleep:  Number of Hours: 7.5     Have you used any form of tobacco in the last 30 days? (Cigarettes, Smokeless Tobacco, Cigars, and/or Pipes): No  Has this patient used any form of tobacco in the last 30 days? (Cigarettes, Smokeless Tobacco, Cigars, and/or Pipes) Yes, No  Blood Alcohol level:  Lab Results  Component Value Date   ETH <10 02/25/2018   ETH <10 95/63/8756    Metabolic Disorder Labs:  Lab Results  Component Value Date   HGBA1C 4.8 09/18/2017    MPG 91.06 09/18/2017   No results found for: PROLACTIN Lab Results  Component Value Date   CHOL 251 (H) 09/18/2017   TRIG 124 09/18/2017   HDL 66 09/18/2017   CHOLHDL 3.8 09/18/2017   VLDL 25 09/18/2017   LDLCALC  160 (H) 09/18/2017   LDLCALC 43 05/14/2014    See Psychiatric Specialty Exam and Suicide Risk Assessment completed by Attending Physician prior to discharge.  Discharge destination:  Home  Is patient on multiple antipsychotic therapies at discharge:  No   Has Patient had three or more failed trials of antipsychotic monotherapy by history:  No  Recommended Plan for Multiple Antipsychotic Therapies: NA   Allergies as of 03/03/2018      Reactions   Morphine And Related Shortness Of Breath   Pt reports chest pain and difficulty breathing.   Indomethacin Hives   Pollen Extract Other (See Comments)   Sinus problems and HA      Medication List    STOP taking these medications   epoetin alfa 4000 UNIT/ML injection Commonly known as:  EPOGEN,PROCRIT   LORazepam 2 MG tablet Commonly known as:  ATIVAN   midodrine 10 MG tablet Commonly known as:  PROAMATINE   OLANZapine 15 MG tablet Commonly known as:  ZYPREXA   pantoprazole 40 MG tablet Commonly known as:  PROTONIX     TAKE these medications     Indication  acetaminophen 325 MG tablet Commonly known as:  TYLENOL Take 325 mg 2 (two) times daily as needed by mouth for moderate pain or headache.  Indication:  Pain   brimonidine 0.2 % ophthalmic solution Commonly known as:  ALPHAGAN Place 1 drop into both eyes 2 (two) times daily.  Indication:  Wide-Angle Glaucoma   carbamazepine 200 MG tablet Commonly known as:  TEGRETOL Take 1 tablet (200 mg total) by mouth 3 (three) times daily. What changed:  when to take this  Indication:  Manic-Depression   cholecalciferol 1000 units tablet Commonly known as:  VITAMIN D Take 1,000 Units by mouth daily.  Indication:  general health   cyanocobalamin 1000 MCG/ML  injection Commonly known as:  (VITAMIN B-12) Inject 1,000 mcg into the muscle every 30 (thirty) days.  Indication:  Inadequate Vitamin B12   dicyclomine 20 MG tablet Commonly known as:  BENTYL Take 1 tablet (20 mg total) by mouth 3 (three) times daily before meals.  Indication:  Irritable Bowel Syndrome   famotidine 20 MG tablet Commonly known as:  PEPCID TAKE 1 TABLET BY MOUTH EVERY DAY  Indication:  Gastroesophageal Reflux Disease   haloperidol 5 MG tablet Commonly known as:  HALDOL Take 0.5 tablets (2.5 mg total) by mouth every Monday, Wednesday, and Friday with hemodialysis AND 1 tablet (5 mg total) at bedtime. What changed:  See the new instructions.  Indication:  Psychosis   lidocaine 5 % Commonly known as:  LIDODERM Place 1 patch onto the skin daily. Remove & Discard patch within 12 hours or as directed by MD  Indication:  pain   lidocaine-prilocaine cream Commonly known as:  EMLA Apply 1 application every Monday, Wednesday, and Friday topically. At dialysis  Indication:  Anesthesia to a Specific Part of the Body   metoprolol tartrate 25 MG tablet Commonly known as:  LOPRESSOR Take 1 tablet (25 mg total) by mouth 2 (two) times daily. What changed:  how much to take  Indication:  High Blood Pressure Disorder   multivitamin Tabs tablet TAKE 1 TABLET BY MOUTH EVERYDAY AT BEDTIME  Indication:  dialysis   rOPINIRole 2 MG tablet Commonly known as:  REQUIP Take 1 tablet (2 mg total) by mouth daily.  Indication:  Restless Leg Syndrome   temazepam 15 MG capsule Commonly known as:  RESTORIL Take 1 capsule (15 mg total)  by mouth at bedtime as needed for sleep.  Indication:  Trouble Sleeping   timolol 0.5 % ophthalmic solution Commonly known as:  TIMOPTIC Place 1 drop into both eyes 2 (two) times daily.  Indication:  Wide-Angle Glaucoma        Follow-up recommendations:  Activity:  as tolerated Diet:  renal Other:  keep follow up appointments  Comments:     Signed: Orson Slick, MD 03/03/2018, 10:04 AM

## 2018-03-04 ENCOUNTER — Inpatient Hospital Stay
Admission: EM | Admit: 2018-03-04 | Discharge: 2018-03-11 | DRG: 917 | Disposition: A | Discharge status: Other Acute Inpatient Hospital | Payer: Medicare HMO | Attending: Internal Medicine | Admitting: Internal Medicine

## 2018-03-04 ENCOUNTER — Emergency Department: Payer: Medicare HMO

## 2018-03-04 ENCOUNTER — Other Ambulatory Visit: Payer: Self-pay

## 2018-03-04 DIAGNOSIS — I517 Cardiomegaly: Secondary | ICD-10-CM | POA: Diagnosis not present

## 2018-03-04 DIAGNOSIS — N186 End stage renal disease: Secondary | ICD-10-CM | POA: Diagnosis not present

## 2018-03-04 DIAGNOSIS — J9601 Acute respiratory failure with hypoxia: Secondary | ICD-10-CM | POA: Diagnosis not present

## 2018-03-04 DIAGNOSIS — N179 Acute kidney failure, unspecified: Secondary | ICD-10-CM | POA: Diagnosis present

## 2018-03-04 DIAGNOSIS — Z8249 Family history of ischemic heart disease and other diseases of the circulatory system: Secondary | ICD-10-CM

## 2018-03-04 DIAGNOSIS — Z79899 Other long term (current) drug therapy: Secondary | ICD-10-CM

## 2018-03-04 DIAGNOSIS — T50902D Poisoning by unspecified drugs, medicaments and biological substances, intentional self-harm, subsequent encounter: Secondary | ICD-10-CM | POA: Diagnosis not present

## 2018-03-04 DIAGNOSIS — T1491XA Suicide attempt, initial encounter: Secondary | ICD-10-CM | POA: Diagnosis not present

## 2018-03-04 DIAGNOSIS — T50904A Poisoning by unspecified drugs, medicaments and biological substances, undetermined, initial encounter: Secondary | ICD-10-CM

## 2018-03-04 DIAGNOSIS — N189 Chronic kidney disease, unspecified: Secondary | ICD-10-CM | POA: Diagnosis present

## 2018-03-04 DIAGNOSIS — F3130 Bipolar disorder, current episode depressed, mild or moderate severity, unspecified: Secondary | ICD-10-CM | POA: Diagnosis present

## 2018-03-04 DIAGNOSIS — Z885 Allergy status to narcotic agent status: Secondary | ICD-10-CM

## 2018-03-04 DIAGNOSIS — T50902A Poisoning by unspecified drugs, medicaments and biological substances, intentional self-harm, initial encounter: Secondary | ICD-10-CM | POA: Diagnosis not present

## 2018-03-04 DIAGNOSIS — R7881 Bacteremia: Secondary | ICD-10-CM | POA: Diagnosis not present

## 2018-03-04 DIAGNOSIS — J9600 Acute respiratory failure, unspecified whether with hypoxia or hypercapnia: Secondary | ICD-10-CM

## 2018-03-04 DIAGNOSIS — Y92009 Unspecified place in unspecified non-institutional (private) residence as the place of occurrence of the external cause: Secondary | ICD-10-CM | POA: Diagnosis not present

## 2018-03-04 DIAGNOSIS — J301 Allergic rhinitis due to pollen: Secondary | ICD-10-CM | POA: Diagnosis present

## 2018-03-04 DIAGNOSIS — Z803 Family history of malignant neoplasm of breast: Secondary | ICD-10-CM

## 2018-03-04 DIAGNOSIS — G92 Toxic encephalopathy: Secondary | ICD-10-CM | POA: Diagnosis present

## 2018-03-04 DIAGNOSIS — R609 Edema, unspecified: Secondary | ICD-10-CM | POA: Diagnosis not present

## 2018-03-04 DIAGNOSIS — R4189 Other symptoms and signs involving cognitive functions and awareness: Secondary | ICD-10-CM | POA: Diagnosis not present

## 2018-03-04 DIAGNOSIS — Z9049 Acquired absence of other specified parts of digestive tract: Secondary | ICD-10-CM | POA: Diagnosis not present

## 2018-03-04 DIAGNOSIS — I959 Hypotension, unspecified: Secondary | ICD-10-CM | POA: Diagnosis present

## 2018-03-04 DIAGNOSIS — R55 Syncope and collapse: Secondary | ICD-10-CM | POA: Diagnosis not present

## 2018-03-04 DIAGNOSIS — B954 Other streptococcus as the cause of diseases classified elsewhere: Secondary | ICD-10-CM | POA: Diagnosis not present

## 2018-03-04 DIAGNOSIS — Z91048 Other nonmedicinal substance allergy status: Secondary | ICD-10-CM | POA: Diagnosis not present

## 2018-03-04 DIAGNOSIS — N2581 Secondary hyperparathyroidism of renal origin: Secondary | ICD-10-CM | POA: Diagnosis present

## 2018-03-04 DIAGNOSIS — N184 Chronic kidney disease, stage 4 (severe): Secondary | ICD-10-CM | POA: Diagnosis not present

## 2018-03-04 DIAGNOSIS — Z90710 Acquired absence of both cervix and uterus: Secondary | ICD-10-CM | POA: Diagnosis not present

## 2018-03-04 DIAGNOSIS — Q613 Polycystic kidney, unspecified: Secondary | ICD-10-CM | POA: Diagnosis not present

## 2018-03-04 DIAGNOSIS — D631 Anemia in chronic kidney disease: Secondary | ICD-10-CM | POA: Diagnosis not present

## 2018-03-04 DIAGNOSIS — I120 Hypertensive chronic kidney disease with stage 5 chronic kidney disease or end stage renal disease: Secondary | ICD-10-CM | POA: Diagnosis present

## 2018-03-04 DIAGNOSIS — Z87440 Personal history of urinary (tract) infections: Secondary | ICD-10-CM | POA: Diagnosis not present

## 2018-03-04 DIAGNOSIS — R0989 Other specified symptoms and signs involving the circulatory and respiratory systems: Secondary | ICD-10-CM | POA: Diagnosis not present

## 2018-03-04 DIAGNOSIS — Z888 Allergy status to other drugs, medicaments and biological substances status: Secondary | ICD-10-CM

## 2018-03-04 DIAGNOSIS — R680 Hypothermia, not associated with low environmental temperature: Secondary | ICD-10-CM | POA: Diagnosis present

## 2018-03-04 DIAGNOSIS — Z4682 Encounter for fitting and adjustment of non-vascular catheter: Secondary | ICD-10-CM | POA: Diagnosis not present

## 2018-03-04 DIAGNOSIS — Z9911 Dependence on respirator [ventilator] status: Secondary | ICD-10-CM

## 2018-03-04 DIAGNOSIS — K219 Gastro-esophageal reflux disease without esophagitis: Secondary | ICD-10-CM | POA: Diagnosis present

## 2018-03-04 DIAGNOSIS — Z992 Dependence on renal dialysis: Secondary | ICD-10-CM

## 2018-03-04 DIAGNOSIS — F315 Bipolar disorder, current episode depressed, severe, with psychotic features: Secondary | ICD-10-CM | POA: Diagnosis not present

## 2018-03-04 DIAGNOSIS — T1491XD Suicide attempt, subsequent encounter: Secondary | ICD-10-CM | POA: Diagnosis not present

## 2018-03-04 DIAGNOSIS — F319 Bipolar disorder, unspecified: Secondary | ICD-10-CM | POA: Diagnosis not present

## 2018-03-04 DIAGNOSIS — T50901A Poisoning by unspecified drugs, medicaments and biological substances, accidental (unintentional), initial encounter: Secondary | ICD-10-CM | POA: Diagnosis not present

## 2018-03-04 DIAGNOSIS — R490 Dysphonia: Secondary | ICD-10-CM | POA: Diagnosis not present

## 2018-03-04 DIAGNOSIS — G9340 Encephalopathy, unspecified: Secondary | ICD-10-CM | POA: Diagnosis not present

## 2018-03-04 DIAGNOSIS — R4182 Altered mental status, unspecified: Secondary | ICD-10-CM | POA: Diagnosis not present

## 2018-03-04 DIAGNOSIS — J9690 Respiratory failure, unspecified, unspecified whether with hypoxia or hypercapnia: Secondary | ICD-10-CM | POA: Diagnosis not present

## 2018-03-04 LAB — CBC
HEMATOCRIT: 27.2 % — AB (ref 35.0–47.0)
Hemoglobin: 9.8 g/dL — ABNORMAL LOW (ref 12.0–16.0)
MCH: 34.2 pg — ABNORMAL HIGH (ref 26.0–34.0)
MCHC: 35.8 g/dL (ref 32.0–36.0)
MCV: 95.6 fL (ref 80.0–100.0)
Platelets: 142 10*3/uL — ABNORMAL LOW (ref 150–440)
RBC: 2.85 MIL/uL — ABNORMAL LOW (ref 3.80–5.20)
RDW: 13.2 % (ref 11.5–14.5)
WBC: 6.7 10*3/uL (ref 3.6–11.0)

## 2018-03-04 LAB — URINALYSIS, COMPLETE (UACMP) WITH MICROSCOPIC
BACTERIA UA: NONE SEEN
Bilirubin Urine: NEGATIVE
GLUCOSE, UA: NEGATIVE mg/dL
HGB URINE DIPSTICK: NEGATIVE
Ketones, ur: NEGATIVE mg/dL
LEUKOCYTES UA: NEGATIVE
NITRITE: NEGATIVE
PH: 7 (ref 5.0–8.0)
Protein, ur: NEGATIVE mg/dL
Specific Gravity, Urine: 1.008 (ref 1.005–1.030)

## 2018-03-04 LAB — URINE DRUG SCREEN, QUALITATIVE (ARMC ONLY)
AMPHETAMINES, UR SCREEN: NOT DETECTED
Barbiturates, Ur Screen: NOT DETECTED
Cannabinoid 50 Ng, Ur ~~LOC~~: NOT DETECTED
Cocaine Metabolite,Ur ~~LOC~~: NOT DETECTED
MDMA (ECSTASY) UR SCREEN: NOT DETECTED
Methadone Scn, Ur: NOT DETECTED
Opiate, Ur Screen: NOT DETECTED
PHENCYCLIDINE (PCP) UR S: NOT DETECTED
TRICYCLIC, UR SCREEN: NOT DETECTED

## 2018-03-04 LAB — COMPREHENSIVE METABOLIC PANEL
ALK PHOS: 66 U/L (ref 38–126)
ALT: 15 U/L (ref 0–44)
ANION GAP: 8 (ref 5–15)
AST: 21 U/L (ref 15–41)
Albumin: 3.4 g/dL — ABNORMAL LOW (ref 3.5–5.0)
BILIRUBIN TOTAL: 0.7 mg/dL (ref 0.3–1.2)
BUN: 18 mg/dL (ref 8–23)
CALCIUM: 8.4 mg/dL — AB (ref 8.9–10.3)
CO2: 27 mmol/L (ref 22–32)
Chloride: 100 mmol/L (ref 98–111)
Creatinine, Ser: 3.85 mg/dL — ABNORMAL HIGH (ref 0.44–1.00)
GFR calc Af Amer: 13 mL/min — ABNORMAL LOW (ref >60)
GFR, EST NON AFRICAN AMERICAN: 11 mL/min — AB (ref >60)
Glucose, Bld: 127 mg/dL — ABNORMAL HIGH (ref 70–99)
POTASSIUM: 4.5 mmol/L (ref 3.5–5.1)
Sodium: 135 mmol/L (ref 135–145)
TOTAL PROTEIN: 5.7 g/dL — AB (ref 6.5–8.1)

## 2018-03-04 LAB — BLOOD GAS, ARTERIAL
Acid-Base Excess: 5.5 mmol/L — ABNORMAL HIGH (ref 0.0–2.0)
Bicarbonate: 27.2 mmol/L (ref 20.0–28.0)
FIO2: 0.45
LHR: 12 {breaths}/min
O2 Saturation: 98.7 %
Patient temperature: 37
VT: 450 mL
pCO2 arterial: 29 mmHg — ABNORMAL LOW (ref 32.0–48.0)
pH, Arterial: 7.58 — ABNORMAL HIGH (ref 7.350–7.450)
pO2, Arterial: 103 mmHg (ref 83.0–108.0)

## 2018-03-04 LAB — MAGNESIUM: MAGNESIUM: 2 mg/dL (ref 1.7–2.4)

## 2018-03-04 LAB — MRSA PCR SCREENING: MRSA BY PCR: NEGATIVE

## 2018-03-04 LAB — PHOSPHORUS: PHOSPHORUS: 3.5 mg/dL (ref 2.5–4.6)

## 2018-03-04 LAB — LACTIC ACID, PLASMA: LACTIC ACID, VENOUS: 1.9 mmol/L (ref 0.5–1.9)

## 2018-03-04 LAB — ETHANOL

## 2018-03-04 LAB — GLUCOSE, CAPILLARY
Glucose-Capillary: 132 mg/dL — ABNORMAL HIGH (ref 70–99)
Glucose-Capillary: 125 mg/dL — ABNORMAL HIGH (ref 70–99)

## 2018-03-04 LAB — SALICYLATE LEVEL

## 2018-03-04 LAB — ACETAMINOPHEN LEVEL

## 2018-03-04 MED ORDER — SODIUM CHLORIDE 0.9 % IV SOLN: 1000.0000 mL | Freq: Once | INTRAVENOUS | Status: DC

## 2018-03-04 MED ORDER — SODIUM CHLORIDE 0.9 % IV BOLUS
250.0000 mL | Freq: Once | INTRAVENOUS | Status: AC
  Administered 2018-03-04: 250 mL via INTRAVENOUS

## 2018-03-04 MED ORDER — SODIUM CHLORIDE 0.9 % IV SOLN
0.0000 ug/min | INTRAVENOUS | Status: DC
  Filled 2018-03-04: qty 1

## 2018-03-04 MED ORDER — MIDAZOLAM HCL 2 MG/2ML IJ SOLN
1.0000 mg | INTRAMUSCULAR | Status: DC | PRN
  Administered 2018-03-07 – 2018-03-08 (×3): 1 mg via INTRAVENOUS
  Filled 2018-03-04 (×6): qty 2

## 2018-03-04 MED ORDER — FENTANYL CITRATE (PF) 100 MCG/2ML IJ SOLN
50.0000 ug | INTRAMUSCULAR | Status: DC | PRN
  Administered 2018-03-04 – 2018-03-08 (×8): 50 ug via INTRAVENOUS
  Filled 2018-03-04 (×9): qty 2

## 2018-03-04 MED ORDER — SODIUM CHLORIDE 0.9 % IV SOLN
1000.0000 mL | Freq: Once | INTRAVENOUS | Status: AC
  Administered 2018-03-04: 1000 mL via INTRAVENOUS

## 2018-03-04 MED ORDER — MIDODRINE HCL 5 MG PO TABS
5.0000 mg | ORAL_TABLET | Freq: Two times a day (BID) | ORAL | Status: DC
  Administered 2018-03-04 – 2018-03-05 (×3): 5 mg via ORAL
  Filled 2018-03-04 (×9): qty 1

## 2018-03-04 MED ORDER — FENTANYL CITRATE (PF) 100 MCG/2ML IJ SOLN
50.0000 ug | INTRAMUSCULAR | Status: DC | PRN
  Administered 2018-03-07 – 2018-03-08 (×2): 50 ug via INTRAVENOUS
  Filled 2018-03-04 (×2): qty 2

## 2018-03-04 MED ORDER — SODIUM CHLORIDE 0.9 % IV SOLN: INTRAVENOUS | Status: DC

## 2018-03-04 MED ORDER — ORAL CARE MOUTH RINSE
15.0000 mL | OROMUCOSAL | Status: DC
  Administered 2018-03-04 – 2018-03-10 (×53): 15 mL via OROMUCOSAL
  Filled 2018-03-04 (×4): qty 15

## 2018-03-04 MED ORDER — HEPARIN SODIUM (PORCINE) 5000 UNIT/ML IJ SOLN
5000.0000 [IU] | Freq: Three times a day (TID) | INTRAMUSCULAR | Status: DC
  Administered 2018-03-04 – 2018-03-09 (×17): 5000 [IU] via SUBCUTANEOUS
  Filled 2018-03-04 (×17): qty 1

## 2018-03-04 MED ORDER — FAMOTIDINE IN NACL 20-0.9 MG/50ML-% IV SOLN
20.0000 mg | Freq: Two times a day (BID) | INTRAVENOUS | Status: DC
  Administered 2018-03-04 – 2018-03-06 (×5): 20 mg via INTRAVENOUS
  Filled 2018-03-04 (×5): qty 50

## 2018-03-04 MED ORDER — PROPOFOL 1000 MG/100ML IV EMUL: 5.0000 ug/kg/min | Freq: Once | INTRAVENOUS | Status: DC

## 2018-03-04 MED ORDER — ROCURONIUM BROMIDE 50 MG/5ML IV SOLN
100.0000 mg | Freq: Once | INTRAVENOUS | Status: AC
  Administered 2018-03-04: 100 mg via INTRAVENOUS
  Filled 2018-03-04: qty 10

## 2018-03-04 MED ORDER — ONDANSETRON HCL 4 MG PO TABS: 4.0000 mg | ORAL_TABLET | Freq: Four times a day (QID) | ORAL | Status: DC | PRN

## 2018-03-04 MED ORDER — ALBUTEROL SULFATE (2.5 MG/3ML) 0.083% IN NEBU: 2.5000 mg | INHALATION_SOLUTION | RESPIRATORY_TRACT | Status: DC | PRN

## 2018-03-04 MED ORDER — CHLORHEXIDINE GLUCONATE 0.12% ORAL RINSE (MEDLINE KIT)
15.0000 mL | Freq: Two times a day (BID) | OROMUCOSAL | Status: DC
  Administered 2018-03-04 – 2018-03-08 (×10): 15 mL via OROMUCOSAL
  Filled 2018-03-04 (×2): qty 15

## 2018-03-04 MED ORDER — ACETAMINOPHEN 650 MG RE SUPP: 650.0000 mg | Freq: Four times a day (QID) | RECTAL | Status: DC | PRN

## 2018-03-04 MED ORDER — IPRATROPIUM-ALBUTEROL 0.5-2.5 (3) MG/3ML IN SOLN
3.0000 mL | RESPIRATORY_TRACT | Status: DC | PRN
  Administered 2018-03-04: 3 mL via RESPIRATORY_TRACT
  Filled 2018-03-04: qty 3

## 2018-03-04 MED ORDER — ONDANSETRON HCL 4 MG/2ML IJ SOLN: 4.0000 mg | Freq: Four times a day (QID) | INTRAMUSCULAR | Status: DC | PRN

## 2018-03-04 MED ORDER — MIDAZOLAM HCL 5 MG/5ML IJ SOLN
1.0000 mg | INTRAMUSCULAR | Status: AC | PRN
  Administered 2018-03-07 (×3): 1 mg via INTRAVENOUS
  Filled 2018-03-04 (×3): qty 1

## 2018-03-04 MED ORDER — SODIUM CHLORIDE 0.9 % IV SOLN
INTRAVENOUS | Status: DC
  Administered 2018-03-04 – 2018-03-05 (×2): via INTRAVENOUS

## 2018-03-04 MED ORDER — ACETAMINOPHEN 325 MG PO TABS
650.0000 mg | ORAL_TABLET | Freq: Four times a day (QID) | ORAL | Status: DC | PRN
  Administered 2018-03-10: 11:00:00 650 mg via ORAL
  Filled 2018-03-04: qty 2

## 2018-03-04 MED ORDER — ETOMIDATE 2 MG/ML IV SOLN
20.0000 mg | Freq: Once | INTRAVENOUS | Status: AC
  Administered 2018-03-04: 20 mg via INTRAVENOUS

## 2018-03-04 NOTE — ED Provider Notes (Signed)
[facility] Emergency Department Provider Note   ____________________________________________    I have reviewed the triage vital signs and the nursing notes.   HISTORY  Chief Complaint Other (Unresponsive)     HPI Joyce [name] is a 72 y.o. female who presents to the emergency department, unresponsive and critically ill.  EMS reports mother went to check on patient today but no answer when she knocked on her door, please were called and found the patient unresponsive on the floor.  Apparently lots of pills were scattered around here.  No further history is available at this time.  Review of medical records demonstrates the patient was discharged from our psychiatric ward yesterday   Past Medical History:  Diagnosis Date  . Anal fissure   . Bipolar affective disorder ([place])   . CKD (chronic kidney disease)    Dr Holley Raring [name] 4  . Colon polyps   . Diverticulitis   . Diverticulitis   . Family history of adverse reaction to anesthesia    mom - PONV  . GERD (gastroesophageal reflux disease)   . Headache    migraines - none over 10 yrs  . Heart murmur   . History of hiatal hernia   . Hypertension   . Pancreatitis    Valproic acid  . Vertigo     Patient Active Problem List   Diagnosis Date Noted  . Acute respiratory failure with hypoxia ([place]) 03/04/2018  . Bipolar I disorder, current or most recent episode manic, with psychotic features ([place]) 02/26/2018  . UTI (urinary tract infection) 12/16/2017  . Bipolar 1 disorder, depressed ([place]) 12/15/2017  . Palliative care encounter   . Bipolar affective disorder, current episode depressed with psychotic symptoms ([place]) 09/17/2017  . Bipolar I disorder, most recent episode (or current) manic ([place]) 09/02/2017  . Acute delirium 09/02/2017  . Altered mental status   . Acute encephalopathy 09/01/2017  . ESRD on dialysis ([place]) 04/06/2017  . Complication of vascular access for dialysis 04/06/2017  .  Hematuria 12/18/2016  . Polycystic kidney 10/12/2016  . Ruptured cyst of kidney 10/03/2016  . HTN (hypertension) 10/01/2016  . Chronic kidney disease 10/01/2016  . Anemia associated with chronic renal failure 09/24/2016  . Acute kidney insufficiency 07/28/2016  . Noninfectious diarrhea   . Benign neoplasm of cecum   . Benign neoplasm of ascending colon   . Diarrhea   . Nausea   . Gastric polyp   . Chronic constipation 12/27/2014  . Nausea with vomiting 12/27/2014  . Left sided abdominal pain 12/27/2014  . Personal history of colonic polyps 05/10/2013    Past Surgical History:  Procedure Laterality Date  . A/V FISTULAGRAM Left 04/14/2017   Procedure: A/V Fistulagram;  Surgeon: Katha Cabal, MD;  Location: [place] [facility];  Service: Cardiovascular;  Laterality: Left;  . A/V FISTULAGRAM Left 06/09/2017   Procedure: A/V FISTULAGRAM;  Surgeon: Katha Cabal, MD;  Location: [place] [facility];  Service: Cardiovascular;  Laterality: Left;  . A/V FISTULAGRAM Left 11/02/2017   Procedure: A/V FISTULAGRAM;  Surgeon: Algernon Huxley, MD;  Location: [place] [facility];  Service: Cardiovascular;  Laterality: Left;  . A/V SHUNT INTERVENTION N/A 11/02/2017   Procedure: A/V SHUNT INTERVENTION;  Surgeon: Algernon Huxley, MD;  Location: [place] [facility];  Service: Cardiovascular;  Laterality: N/A;  . ABDOMINAL HYSTERECTOMY  1990 ?  . AV FISTULA PLACEMENT  4/30  . BREAST EXCISIONAL BIOPSY Left 1994   neg surgical  bx  . CHOLECYSTECTOMY  2003  . COLONOSCOPY  2014   Dr. Jamal Collin  . COLONOSCOPY WITH PROPOFOL N/A 09/24/2015   Procedure: COLONOSCOPY WITH random colon byopies.;  Surgeon: Lucilla Lame, MD;  Location: [place];  Service: Endoscopy;  Laterality: N/A;  . ESOPHAGOGASTRODUODENOSCOPY (EGD) WITH PROPOFOL N/A 09/24/2015   Procedure: ESOPHAGOGASTRODUODENOSCOPY (EGD) ;  Surgeon: Lucilla Lame, MD;  Location: [place];  Service: Endoscopy;  Laterality: N/A;  . EYE  SURGERY    . PERIPHERAL VASCULAR CATHETERIZATION N/A 05/29/2015   Procedure: A/V Shuntogram/Fistulagram;  Surgeon: Katha Cabal, MD;  Location: [place] [facility];  Service: Cardiovascular;  Laterality: N/A;  . PERIPHERAL VASCULAR CATHETERIZATION N/A 05/29/2015   Procedure: A/V Shunt Intervention;  Surgeon: Katha Cabal, MD;  Location: [place] [facility];  Service: Cardiovascular;  Laterality: N/A;  . POLYPECTOMY  09/24/2015   Procedure: POLYPECTOMY INTESTINAL;  Surgeon: Lucilla Lame, MD;  Location: [place];  Service: Endoscopy;;  cecal polyp ascending polyp    Prior to Admission medications   Medication Sig Start Date End Date Taking? Authorizing Provider  acetaminophen (TYLENOL) 325 MG tablet Take 325 mg 2 (two) times daily as needed by mouth for moderate pain or headache.    Yes [provider]  brimonidine (ALPHAGAN) 0.2 % ophthalmic solution Place 1 drop into both eyes 2 (two) times daily. 10/22/17  Yes Clapacs, [name], MD  carbamazepine (TEGRETOL) 200 MG tablet Take 1 tablet (200 mg total) by mouth 3 (three) times daily. 03/03/18  Yes [name], [name], MD  cholecalciferol (VITAMIN D) 1000 UNITS tablet Take 1,000 Units by mouth daily.   Yes [provider]  cyanocobalamin (,VITAMIN B-12,) 1000 MCG/ML injection Inject 1,000 mcg into the muscle every 30 (thirty) days.   Yes [provider]  dicyclomine (BENTYL) 20 MG tablet Take 1 tablet (20 mg total) by mouth 3 (three) times daily before meals. 10/22/17  Yes Clapacs, [name], MD  famotidine (PEPCID) 20 MG tablet TAKE 1 TABLET BY MOUTH EVERY DAY 12/31/17  Yes Clapacs, [name], MD  haloperidol (HALDOL) 5 MG tablet Take 0.5 tablets (2.5 mg total) by mouth every Monday, Wednesday, and Friday with hemodialysis AND 1 tablet (5 mg total) at bedtime. 03/03/18  Yes [name], [name], MD  lidocaine-prilocaine (EMLA) cream Apply 1 application every Monday, Wednesday, and Friday topically. At dialysis  05/06/17  Yes [provider]  metoprolol tartrate (LOPRESSOR) 25 MG tablet Take 1 tablet (25 mg total) by mouth 2 (two) times daily. 03/03/18  Yes [name], [name], MD  rOPINIRole (REQUIP) 2 MG tablet Take 1 tablet (2 mg total) by mouth daily. 03/03/18  Yes [name], [name], MD  temazepam (RESTORIL) 15 MG capsule Take 1 capsule (15 mg total) by mouth at bedtime as needed for sleep. 03/03/18  Yes [name], [name], MD  timolol (TIMOPTIC) 0.5 % ophthalmic solution Place 1 drop into both eyes 2 (two) times daily. 10/22/17  Yes Clapacs, [name], MD  lidocaine (LIDODERM) 5 % Place 1 patch onto the skin daily. Remove & Discard patch within 12 hours or as directed by MD Patient not taking: Reported on 02/26/2018 10/22/17   Clapacs, [name], MD  multivitamin (RENA-VIT) TABS tablet TAKE 1 TABLET BY MOUTH EVERYDAY AT BEDTIME Patient taking differently: No sig reported 10/27/17   Clapacs, [name], MD     Allergies Morphine and related; Indomethacin; and Pollen extract  Family History  Problem Relation Age of Onset  .  Stroke Father   . Hypertension Father   . Breast cancer Other   . Colon cancer Neg Hx   . Liver disease Neg Hx     Social History Social History   Tobacco Use  . Smoking status: Never Smoker  . Smokeless tobacco: Never Used  Substance Use Topics  . Alcohol use: No    Alcohol/week: 0.0 standard drinks  . Drug use: No    Unable to obtain review of Systems to altered mental status     ____________________________________________   PHYSICAL EXAM:  VITAL SIGNS: ED Triage Vitals  Enc Vitals Group     BP 03/04/18 1051 119/62     Pulse Rate 03/04/18 1054 97     Resp 03/04/18 1054 18     Temp --      Temp src --      SpO2 03/04/18 1054 100 %     Weight 03/04/18 1054 68 kg (149 lb 14.6 oz)     Height 03/04/18 1054 1.6 m (5\' 3" )     Head Circumference --      Peak Flow --      Pain Score --      Pain Loc --      Pain Edu? --      Excl. in [place]? --      Constitutional: Unresponsive to painful stimuli, oral airway in place  Head: Atraumatic. Nose: No congestion/rhinnorhea. Mouth/Throat: Mucous membranes are moist.  No dentures, oral airway in place  Cardiovascular: Normal rate, regular rhythm. Grossly normal heart sounds.  Good peripheral circulation. Respiratory: Decreased respiratory effort, lungs clear to auscultation Gastrointestinal: Soft No distention.    Musculoskeletal: No lower extremity tenderness nor edema.  Warm and well perfused Neurologic: Unresponsive, no withdrawal to painful stimuli Skin:  Skin is warm, dry and intact. No rash noted.   ____________________________________________   LABS (all labs ordered are listed, but only abnormal results are displayed)  Labs Reviewed  COMPREHENSIVE METABOLIC PANEL - Abnormal; Notable for the following components:      Result Value   Glucose, Bld 127 (*)    Creatinine, Ser 3.85 (*)    Calcium 8.4 (*)    Total Protein 5.7 (*)    Albumin 3.4 (*)    GFR calc non Af Amer 11 (*)    GFR calc Af Amer 13 (*)    All other components within normal limits  CBC - Abnormal; Notable for the following components:   RBC 2.85 (*)    Hemoglobin 9.8 (*)    HCT 27.2 (*)    MCH 34.2 (*)    Platelets 142 (*)    All other components within normal limits  URINALYSIS, COMPLETE (UACMP) WITH MICROSCOPIC - Abnormal; Notable for the following components:   Color, Urine YELLOW (*)    APPearance CLEAR (*)    All other components within normal limits  BLOOD GAS, ARTERIAL - Abnormal; Notable for the following components:   pH, Arterial 7.58 (*)    pCO2 arterial 29 (*)    Acid-Base Excess 5.5 (*)    All other components within normal limits  GLUCOSE, CAPILLARY - Abnormal; Notable for the following components:   Glucose-Capillary 132 (*)    All other components within normal limits  CULTURE, BLOOD (ROUTINE X 2)  CULTURE, BLOOD (ROUTINE X 2)  URINE CULTURE  MRSA PCR SCREENING  LACTIC ACID,  PLASMA  ETHANOL  URINE DRUG SCREEN, QUALITATIVE ([facility] ONLY)  ACETAMINOPHEN LEVEL  SALICYLATE LEVEL  MAGNESIUM  PHOSPHORUS  CBG MONITORING, ED   ____________________________________________  EKG   ____________________________________________  RADIOLOGY  X-ray confirms ET position, No evidence of aspiration ____________________________________________   PROCEDURES  Procedure(s) performed: yes  Procedure Name: Intubation Date/Time: 03/04/2018 12:45 PM Performed by: Lavonia Drafts, MD Pre-anesthesia Checklist: Patient identified, Emergency Drugs available, Suction available and Patient being monitored Oxygen Delivery Method: Ambu bag Preoxygenation: Pre-oxygenation with 100% oxygen Induction Type: IV induction and Rapid sequence Laryngoscope Size: Glidescope and 3 Grade View: Grade I Tube size: 7.5 mm Number of attempts: 1 Placement Confirmation: ETT inserted through vocal cords under direct vision,  CO2 detector and Breath sounds checked- equal and bilateral Tube secured with: ETT holder Dental Injury: Teeth and Oropharynx as per pre-operative assessment         Critical Care performed: yes  CRITICAL CARE Performed by: Lavonia Drafts   Total critical care time: 35 minutes  Critical care time was exclusive of separately billable procedures and treating other patients.  Critical care was necessary to treat or prevent imminent or life-threatening deterioration.  Critical care was time spent personally by me on the following activities: development of treatment plan with patient and/or surrogate as well as nursing, discussions with consultants, evaluation of patient's response to treatment, examination of patient, obtaining history from patient or surrogate, ordering and performing treatments and interventions, ordering and review of laboratory studies, ordering and review of radiographic studies, pulse oximetry and re-evaluation of patient's  condition.  ____________________________________________   INITIAL IMPRESSION / ASSESSMENT AND PLAN / ED COURSE  Pertinent labs & imaging results that were available during my care of the patient were reviewed by me and considered in my medical decision making (see chart for details).  Patient presents unresponsive.  Blood pressure is initially stable, heart rate is normal.  Given EMS report, strongly suspicious for intentional drug overdose.  Patient has no gag reflex, intubated for airway protection.  After intubation blood pressure dipped but responded well to fluids  Spoke with family who is here in the emergency department.  Lab work is overall reassuring, chest x-ray confirms ET tube position  Discussed with hospitalist for admission    ____________________________________________   FINAL CLINICAL IMPRESSION(S) / ED DIAGNOSES  Drug overdose     Note:  This document was prepared using Dragon voice recognition software and may include unintentional dictation errors.    Lavonia Drafts, MD 03/04/18 1249

## 2018-03-04 NOTE — ED Notes (Signed)
X-ray at bedside

## 2018-03-04 NOTE — ED Notes (Signed)
Report to Ashley, RN

## 2018-03-04 NOTE — ED Notes (Signed)
Family and chaplain at bedside  

## 2018-03-04 NOTE — ED Notes (Signed)
Paper bag of patient prescription medication sent home with patients sister, Sherri Rad.

## 2018-03-04 NOTE — Progress Notes (Signed)
[facility]  ROUNDING NOTE   Subjective:     Patient was d.c yesterday Found down at home and brought to ER Currently intubated No sedation Patient unresponsive  Objective:  Vital signs in last 24 hours:  Temp:  [97.9 F (36.6 C)] 97.9 F (36.6 C) (08/15 1245) Pulse Rate:  [63-97] 63 (08/15 1600) Resp:  [6-23] 13 (08/15 1600) BP: (76-144)/(40-64) 89/43 (08/15 1600) SpO2:  [94 %-100 %] 99 % (08/15 1600) FiO2 (%):  [40 %-45 %] 40 % (08/15 1600) Weight:  [68 kg-69.9 kg] 69.9 kg (08/15 1245)  Weight change:  Filed Weights   03/04/18 1054 03/04/18 1245  Weight: 68 kg 69.9 kg    Intake/Output: No intake/output data recorded.   Intake/Output this shift:  Total I/O In: 1679.2 [I.V.:1129.2; IV Piggyback:550] Out: -   Physical Exam: General: Critically ill  Head: Normocephalic, atraumatic. Moist oral mucosal membranes  Eyes: Anicteric,  Neck: Supple,   Lungs:  Ventilator assisted, Fio2 40%  Heart: Regular rate and rhythm  Abdomen:  Soft, nontender,   Extremities:  no peripheral edema.  Neurologic:  not responding to verbal or tactile stimuli  Skin: No lesions  Access: Left AVF    Basic Metabolic Panel: Recent Labs  Lab 02/25/18 1705 03/02/18 1013 03/04/18 1053 03/04/18 1341  NA 141 134* 135  --   K 3.6 3.8 4.5  --   CL 101 93* 100  --   CO2 28 32 27  --   GLUCOSE 119* 93 127*  --   BUN 20 30* 18  --   CREATININE 4.40* 4.62* 3.85*  --   CALCIUM 9.4 9.0 8.4*  --   MG  --   --   --  2.0  PHOS  --  4.0  --  3.5    Liver Function Tests: Recent Labs  Lab 02/25/18 1705 03/02/18 1013 03/04/18 1053  AST 22  --  21  ALT 15  --  15  ALKPHOS 68  --  66  BILITOT 0.6  --  0.7  PROT 7.1  --  5.7*  ALBUMIN 4.2 3.8 3.4*   No results for input(s): LIPASE, AMYLASE in the last 168 hours. No results for input(s): AMMONIA in the last 168 hours.  CBC: Recent Labs  Lab 02/25/18 1705 03/02/18 1013 03/04/18 1053  WBC 8.2 7.4 6.7  HGB 12.9 10.9*  9.8*  HCT 36.9 30.8* 27.2*  MCV 97.6 96.6 95.6  PLT 205 133* 142*    Cardiac Enzymes: No results for input(s): CKTOTAL, CKMB, CKMBINDEX, TROPONINI in the last 168 hours.  BNP: Invalid input(s): POCBNP  CBG: Recent Labs  Lab 03/04/18 1103 03/04/18 1237  GLUCAP 132* 125*    Microbiology: Results for orders placed or performed during the hospital encounter of 03/04/18  MRSA PCR Screening     Status: None   Collection Time: 03/04/18 12:37 PM  Result Value Ref Range Status   MRSA by PCR NEGATIVE NEGATIVE Final    Comment:        The GeneXpert MRSA Assay (FDA approved for NASAL specimens only), is one component of a comprehensive MRSA colonization surveillance program. It is not intended to diagnose MRSA infection nor to guide or monitor treatment for MRSA infections. Performed at [facility], [address] [place], [place] [address]     Coagulation Studies: No results for input(s): LABPROT, INR in the last 72 hours.  Urinalysis: Recent Labs    03/04/18 1102  COLORURINE YELLOW*  LABSPEC 1.008  PHURINE 7.0  GLUCOSEU NEGATIVE  HGBUR NEGATIVE  BILIRUBINUR NEGATIVE  KETONESUR NEGATIVE  PROTEINUR NEGATIVE  NITRITE NEGATIVE  LEUKOCYTESUR NEGATIVE      Imaging: Dg Abdomen 1 View  Result Date: 03/04/2018 CLINICAL DATA:  OG tube advancement. EXAM: ABDOMEN - 1 VIEW COMPARISON:  CT scan March 11, 2017.  Chest x-ray March 04, 2018. FINDINGS: The side port of the OG tube is near the GE junction with the tip in the fundus. The patient may benefit from advancing the tube another 8 cm or so. Cholecystectomy clips. Rounded opacities project over the right upper quadrant, nonspecific. No other acute abnormalities. IMPRESSION: 1. The side port of the OG tube is probably just above the GE junction. The distal tip is within the stomach. Consider advancing up to 8 cm. 2. Rounded radiopaque densities project over the right upper quadrant and could be something on the  patient. The patient is status post cholecystectomy which would exclude gallstones. No stones were seen in the right kidney on the previous CT scan. Recommend clinical correlation. Electronically Signed   By: Dorise Bullion III M.D   On: 03/04/2018 11:47   Dg Chest Port 1 View  Result Date: 03/04/2018 CLINICAL DATA:  Intubated. EXAM: PORTABLE CHEST 1 VIEW COMPARISON:  12/15/2017. FINDINGS: Interval endotracheal tube with its tip 2 cm above the carina. This could be retracted 2 cm to place it at the level of the clavicles. Interval nasogastric or orogastric tube with its tip at the gastroesophageal junction and side hole in the distal esophagus. Poor inspiration with stable borderline enlarged cardiac silhouette and clear lungs. Cholecystectomy clips. Left subclavian and axillary stent. Unremarkable bones. IMPRESSION: 1. Endotracheal tube tip 2 cm above the carina. This could be retracted 2 cm to place it at the level of the clavicles. 2. Nasogastric tube tip at the gastroesophageal junction and side hole in the distal esophagus. It is recommended that this be advanced. 3. Stable borderline cardiomegaly. Electronically Signed   By: Claudie Revering M.D.   On: 03/04/2018 11:44     Medications:   . sodium chloride 50 mL/hr at 03/04/18 1600  . famotidine (PEPCID) IV Stopped (03/04/18 1550)   . chlorhexidine gluconate (MEDLINE KIT)  15 mL Mouth Rinse BID  . heparin  5,000 Units Subcutaneous Q8H  . mouth rinse  15 mL Mouth Rinse 10 times per day   acetaminophen **OR** acetaminophen, albuterol, fentaNYL (SUBLIMAZE) injection, fentaNYL (SUBLIMAZE) injection, midazolam, midazolam, ondansetron **OR** ondansetron (ZOFRAN) IV  Assessment/ Plan:  Joyce [name] is a 72 y.o. white female with end stage renal disease on hemodialysis, hypertension, bipolar disorder, GERD, polycystic kidney disease  [facility] MWF left AVF  1. End Stage Renal Disease  No acute indication for HD Plan  for tomorrow depending on clinical stability Currently patient is hypotensive requiring pressors Acetaminophen, salicylate is negative  2.  Acute Resp failure - Vent assisted at present  3. Anemia of chronic kidney disease: hemoglobin 9.8 - EPO with HD       LOS: 0 Joyce [name] 8/15/20194:23 PM

## 2018-03-04 NOTE — H&P (Addendum)
[facility] [place] NAME: Joyce [name]    MR#:  [id number]  DATE OF BIRTH:  01-12-1946  DATE OF ADMISSION:  03/04/2018  PRIMARY CARE PHYSICIAN: Cletis Athens, MD   REQUESTING/REFERRING PHYSICIAN: Dr. Corky Downs  CHIEF COMPLAINT:   Chief Complaint  Patient presents with  . Other    Unresponsive   unresponsiveness today. HISTORY OF PRESENT ILLNESS:  Joyce [name]  is a 72 y.o. female with a known history of multiple medical problems as below.  The patient was sent to ED for unresponsiveness today.  She was just discharged from psych unit yesterday.  According to her niece, she was fine last night but complains of drowsiness.  She is intubated in the ED.  Dr. Corky Downs requests admission. PAST MEDICAL HISTORY:   Past Medical History:  Diagnosis Date  . Anal fissure   . Bipolar affective disorder ([place])   . CKD (chronic kidney disease)    Dr Holley Raring [name] 4  . Colon polyps   . Diverticulitis   . Diverticulitis   . Family history of adverse reaction to anesthesia    mom - PONV  . GERD (gastroesophageal reflux disease)   . Headache    migraines - none over 10 yrs  . Heart murmur   . History of hiatal hernia   . Hypertension   . Pancreatitis    Valproic acid  . Vertigo     PAST SURGICAL HISTORY:   Past Surgical History:  Procedure Laterality Date  . A/V FISTULAGRAM Left 04/14/2017   Procedure: A/V Fistulagram;  Surgeon: Katha Cabal, MD;  Location: [facility];  Service: Cardiovascular;  Laterality: Left;  . A/V FISTULAGRAM Left 06/09/2017   Procedure: A/V FISTULAGRAM;  Surgeon: Katha Cabal, MD;  Location: [facility];  Service: Cardiovascular;  Laterality: Left;  . A/V FISTULAGRAM Left 11/02/2017   Procedure: A/V FISTULAGRAM;  Surgeon: Algernon Huxley, MD;  Location: [facility];  Service: Cardiovascular;  Laterality: Left;  . A/V SHUNT INTERVENTION N/A 11/02/2017   Procedure: A/V SHUNT  INTERVENTION;  Surgeon: Algernon Huxley, MD;  Location: [facility];  Service: Cardiovascular;  Laterality: N/A;  . ABDOMINAL HYSTERECTOMY  1990 ?  . AV FISTULA PLACEMENT  4/30  . BREAST EXCISIONAL BIOPSY Left 1994   neg surgical bx  . CHOLECYSTECTOMY  2003  . COLONOSCOPY  2014   Dr. Jamal Collin  . COLONOSCOPY WITH PROPOFOL N/A 09/24/2015   Procedure: COLONOSCOPY WITH random colon byopies.;  Surgeon: Lucilla Lame, MD;  Location: [facility];  Service: Endoscopy;  Laterality: N/A;  . ESOPHAGOGASTRODUODENOSCOPY (EGD) WITH PROPOFOL N/A 09/24/2015   Procedure: ESOPHAGOGASTRODUODENOSCOPY (EGD) ;  Surgeon: Lucilla Lame, MD;  Location: [facility];  Service: Endoscopy;  Laterality: N/A;  . EYE SURGERY    . PERIPHERAL VASCULAR CATHETERIZATION N/A 05/29/2015   Procedure: A/V Shuntogram/Fistulagram;  Surgeon: Katha Cabal, MD;  Location: [facility];  Service: Cardiovascular;  Laterality: N/A;  . PERIPHERAL VASCULAR CATHETERIZATION N/A 05/29/2015   Procedure: A/V Shunt Intervention;  Surgeon: Katha Cabal, MD;  Location: [facility];  Service: Cardiovascular;  Laterality: N/A;  . POLYPECTOMY  09/24/2015   Procedure: POLYPECTOMY INTESTINAL;  Surgeon: Lucilla Lame, MD;  Location: [facility];  Service: Endoscopy;;  cecal polyp ascending polyp    SOCIAL HISTORY:   Social History   Tobacco Use  . Smoking status: Never Smoker  . Smokeless tobacco:  Never Used  Substance Use Topics  . Alcohol use: No    Alcohol/week: 0.0 standard drinks    FAMILY HISTORY:   Family History  Problem Relation Age of Onset  . Stroke Father   . Hypertension Father   . Breast cancer Other   . Colon cancer Neg Hx   . Liver disease Neg Hx     DRUG ALLERGIES:   Allergies  Allergen Reactions  . Morphine And Related Shortness Of Breath    Pt reports chest pain and difficulty breathing.  . Indomethacin Hives  . Pollen Extract Other (See Comments)    Sinus problems and  HA    REVIEW OF SYSTEMS:   Review of Systems  Unable to perform ROS: Intubated    MEDICATIONS AT HOME:   Prior to Admission medications   Medication Sig Start Date End Date Taking? Authorizing Provider  acetaminophen (TYLENOL) 325 MG tablet Take 325 mg 2 (two) times daily as needed by mouth for moderate pain or headache.    Yes [provider]  brimonidine (ALPHAGAN) 0.2 % ophthalmic solution Place 1 drop into both eyes 2 (two) times daily. 10/22/17  Yes Clapacs, [name], MD  carbamazepine (TEGRETOL) 200 MG tablet Take 1 tablet (200 mg total) by mouth 3 (three) times daily. 03/03/18  Yes [name], [name], MD  cholecalciferol (VITAMIN D) 1000 UNITS tablet Take 1,000 Units by mouth daily.   Yes [provider]  cyanocobalamin (,VITAMIN B-12,) 1000 MCG/ML injection Inject 1,000 mcg into the muscle every 30 (thirty) days.   Yes [provider]  dicyclomine (BENTYL) 20 MG tablet Take 1 tablet (20 mg total) by mouth 3 (three) times daily before meals. 10/22/17  Yes Clapacs, [name], MD  famotidine (PEPCID) 20 MG tablet TAKE 1 TABLET BY MOUTH EVERY DAY 12/31/17  Yes Clapacs, [name], MD  haloperidol (HALDOL) 5 MG tablet Take 0.5 tablets (2.5 mg total) by mouth every Monday, Wednesday, and Friday with hemodialysis AND 1 tablet (5 mg total) at bedtime. 03/03/18  Yes [name], [name], MD  lidocaine-prilocaine (EMLA) cream Apply 1 application every Monday, Wednesday, and Friday topically. At dialysis 05/06/17  Yes [provider]  metoprolol tartrate (LOPRESSOR) 25 MG tablet Take 1 tablet (25 mg total) by mouth 2 (two) times daily. 03/03/18  Yes [name], [name], MD  rOPINIRole (REQUIP) 2 MG tablet Take 1 tablet (2 mg total) by mouth daily. 03/03/18  Yes [name], [name], MD  temazepam (RESTORIL) 15 MG capsule Take 1 capsule (15 mg total) by mouth at bedtime as needed for sleep. 03/03/18  Yes [name], [name], MD  timolol (TIMOPTIC) 0.5 % ophthalmic  solution Place 1 drop into both eyes 2 (two) times daily. 10/22/17  Yes Clapacs, [name], MD  lidocaine (LIDODERM) 5 % Place 1 patch onto the skin daily. Remove & Discard patch within 12 hours or as directed by MD Patient not taking: Reported on 02/26/2018 10/22/17   Clapacs, [name], MD  multivitamin (RENA-VIT) TABS tablet TAKE 1 TABLET BY MOUTH EVERYDAY AT BEDTIME Patient taking differently: No sig reported 10/27/17   Clapacs, [name], MD      VITAL SIGNS:  Blood pressure 126/61, pulse 82, resp. rate (!) 21, height 5\' 3"  (1.6 m), weight 68 kg, SpO2 100 %.  PHYSICAL EXAMINATION:  Physical Exam  GENERAL:  72 y.o.-year-old patient lying in the bed with intubation and ventilation. EYES: Pupils equal, round, reactive to light and accommodation. No scleral icterus.Marland Kitchen  HEENT:  Head atraumatic, normocephalic.  NECK:  Supple, no jugular venous distention. No thyroid enlargement, no tenderness.  LUNGS: Normal breath sounds bilaterally, no wheezing, rales,rhonchi or crepitation. No use of accessory muscles of respiration.  CARDIOVASCULAR: S1, S2 normal. No murmurs, rubs, or gallops.  ABDOMEN: Soft, nontender, nondistended. Bowel sounds present. No organomegaly or mass.  EXTREMITIES: No pedal edema, cyanosis, or clubbing.  NEUROLOGIC: Unable to exam. PSYCHIATRIC: The patient is intubated. SKIN: No obvious rash, lesion, or ulcer.   LABORATORY PANEL:   CBC Recent Labs  Lab 03/04/18 1053  WBC 6.7  HGB 9.8*  HCT 27.2*  PLT 142*   ------------------------------------------------------------------------------------------------------------------  Chemistries  Recent Labs  Lab 03/04/18 1053  NA 135  K 4.5  CL 100  CO2 27  GLUCOSE 127*  BUN 18  CREATININE 3.85*  CALCIUM 8.4*  AST 21  ALT 15  ALKPHOS 66  BILITOT 0.7   ------------------------------------------------------------------------------------------------------------------  Cardiac Enzymes No results for input(s): TROPONINI in the  last 168 hours. ------------------------------------------------------------------------------------------------------------------  RADIOLOGY:  Dg Abdomen 1 View  Result Date: 03/04/2018 CLINICAL DATA:  OG tube advancement. EXAM: ABDOMEN - 1 VIEW COMPARISON:  CT scan March 11, 2017.  Chest x-ray March 04, 2018. FINDINGS: The side port of the OG tube is near the GE junction with the tip in the fundus. The patient may benefit from advancing the tube another 8 cm or so. Cholecystectomy clips. Rounded opacities project over the right upper quadrant, nonspecific. No other acute abnormalities. IMPRESSION: 1. The side port of the OG tube is probably just above the GE junction. The distal tip is within the stomach. Consider advancing up to 8 cm. 2. Rounded radiopaque densities project over the right upper quadrant and could be something on the patient. The patient is status post cholecystectomy which would exclude gallstones. No stones were seen in the right kidney on the previous CT scan. Recommend clinical correlation. Electronically Signed   By: Dorise Bullion III M.D   On: 03/04/2018 11:47   Dg Chest Port 1 View  Result Date: 03/04/2018 CLINICAL DATA:  Intubated. EXAM: PORTABLE CHEST 1 VIEW COMPARISON:  12/15/2017. FINDINGS: Interval endotracheal tube with its tip 2 cm above the carina. This could be retracted 2 cm to place it at the level of the clavicles. Interval nasogastric or orogastric tube with its tip at the gastroesophageal junction and side hole in the distal esophagus. Poor inspiration with stable borderline enlarged cardiac silhouette and clear lungs. Cholecystectomy clips. Left subclavian and axillary stent. Unremarkable bones. IMPRESSION: 1. Endotracheal tube tip 2 cm above the carina. This could be retracted 2 cm to place it at the level of the clavicles. 2. Nasogastric tube tip at the gastroesophageal junction and side hole in the distal esophagus. It is recommended that this be advanced. 3.  Stable borderline cardiomegaly. Electronically Signed   By: Claudie Revering M.D.   On: 03/04/2018 11:44      IMPRESSION AND PLAN:   Unresponsiveness and acute respiratory failure, possible due to drug overdose The patient will be admitted to ICU. Continue ventilation and sedation, follow-up intensivist.  Follow-up urine toxicology.  Hypotension, improved with normal saline IV bolus. Continue normal saline IV.  CKD stage IV.  Stable.  Hypertension.  Hold hypertension medication.  Anemia of chronic disease.  Stable. I discussed with ICU nurse practitioner. All the records are reviewed and case discussed with ED provider. Management plans discussed with the patient, family and they are in agreement.  CODE STATUS: Full code  TOTAL  CRITICAL TIME TAKING CARE OF THIS PATIENT: 46 minutes.    Demetrios Loll M.D on 03/04/2018 at 12:25 PM  Between 7am to 6pm - Pager - [phone]  After 6pm go to [email or web address] - Proofreader  [facility]  Office  [phone]  CC: Primary care physician; Cletis Athens, MD   Note: This dictation was prepared with Dragon dictation along with smaller phrase technology. Any transcriptional errors that result from this process are unin

## 2018-03-04 NOTE — ED Notes (Signed)
Pt clothing cut off prior to my arrival, family provides ok to discard clothing.

## 2018-03-04 NOTE — ED Triage Notes (Addendum)
To ER via [facility] from home. Family called EMS after finding patient in bed, unresponsive. Last talked to patient 1700 03/03/18. Pill bottles found in bed with patient. Pt arrives with OPA in place. 20G to R AC by EMS. Pt HR initially 90, NSR down to 46 SB per EMS, CBG 126 with EMS. 92/40 BP with EMS.  EDP at bedside. Pt currently being bagged by RT at bedside.

## 2018-03-04 NOTE — ED Notes (Signed)
Intubation medications given by Rubin Payor, RN

## 2018-03-04 NOTE — Progress Notes (Signed)
Chaplain was charting and was referred by [facility] to the Pt. Chaplain entered with care team giving full attention. Chaplain maintained a presence out side the room and notified lobby that family would be sent to family room. Chaplain accomplices MD to give family update and escort into the room. Sisters pastor was with family and Bonney Roussel was asked to pray. Chaplain prayed for healing and strength for patient and family. Chaplain prayed for the care team and the ministry of the pastor.  Chaplain will continue to follow.    03/04/18 1100  Clinical Encounter Type  Visited With Patient;Family  Visit Type Initial;Spiritual support  Referral From Nurse  Spiritual Encounters  Spiritual Needs Prayer;Emotional

## 2018-03-04 NOTE — Consult Note (Addendum)
PULMONARY / CRITICAL CARE MEDICINE   Name: Joyce [name] MRN: [id number] DOB: 01-01-1946    ADMISSION DATE:  03/04/2018 CONSULTATION DATE:  03/04/2018  REFERRING MD:  Dr. Bridgett Larsson  CHIEF COMPLAINT:  Unresponsive, Drug overdose  HISTORY OF PRESENT ILLNESS:   Joyce [name] is a 72 year old female with a past medical history as listed below, who presented to [facility] ED on 03/04/18 after being found unresponsive by her mother.  The patient is currently intubated and unresponsive with no family present, therefore the HPI is obtained from ED and nursing notes.  The patient was found with pills scattered around her in the am 8/15.  Family had last talked to the patient at 1700 on 03/03/18, of which she seemed in normal spirits.   At this time, it is unclear what pills she ingested, and whether this was an intentional or unintentional overdose. It is noted that she was discharged from [facility] Psychiatric ward yesterday 03/03/18.  Review of home medications indicates she takes Carbamazepine, Haloperidol, Temazepam, and Ropinirole  In the ED, she was intubated for airway protection.  Initial workup in the ED revealed: Na 135, Creatinine 3.85, Lactic Acid 1.9, UA negative, and CXR w/ stable cardiomegaly.  She is admitted to [facility] ICU for drug overdose requiring intubation for airway protection.  PCCM is consulted for further management.  PAST MEDICAL HISTORY :  She  has a past medical history of Anal fissure, Bipolar affective disorder ([place]), CKD (chronic kidney disease), Colon polyps, Diverticulitis, Diverticulitis, Family history of adverse reaction to anesthesia, GERD (gastroesophageal reflux disease), Headache, Heart murmur, History of hiatal hernia, Hypertension, Pancreatitis, and Vertigo.  PAST SURGICAL HISTORY: She  has a past surgical history that includes Cholecystectomy (2003); Eye surgery; Colonoscopy (2014); Abdominal hysterectomy (1990 ?); AV fistula placement (4/30); Breast excisional biopsy (Left, 1994);  Cardiac catheterization (N/A, 05/29/2015); Cardiac catheterization (N/A, 05/29/2015); Colonoscopy with propofol (N/A, 09/24/2015); Esophagogastroduodenoscopy (egd) with propofol (N/A, 09/24/2015); Polypectomy (09/24/2015); A/V Fistulagram (Left, 04/14/2017); A/V Fistulagram (Left, 06/09/2017); A/V Fistulagram (Left, 11/02/2017); and A/V SHUNT INTERVENTION (N/A, 11/02/2017).  Allergies  Allergen Reactions  . Morphine And Related Shortness Of Breath    Pt reports chest pain and difficulty breathing.  . Indomethacin Hives  . Pollen Extract Other (See Comments)    Sinus problems and HA    No current facility-administered medications on file prior to encounter.    Current Outpatient Medications on File Prior to Encounter  Medication Sig  . acetaminophen (TYLENOL) 325 MG tablet Take 325 mg 2 (two) times daily as needed by mouth for moderate pain or headache.   . brimonidine (ALPHAGAN) 0.2 % ophthalmic solution Place 1 drop into both eyes 2 (two) times daily.  . carbamazepine (TEGRETOL) 200 MG tablet Take 1 tablet (200 mg total) by mouth 3 (three) times daily.  . cholecalciferol (VITAMIN D) 1000 UNITS tablet Take 1,000 Units by mouth daily.  . cyanocobalamin (,VITAMIN B-12,) 1000 MCG/ML injection Inject 1,000 mcg into the muscle every 30 (thirty) days.  Marland Kitchen dicyclomine (BENTYL) 20 MG tablet Take 1 tablet (20 mg total) by mouth 3 (three) times daily before meals.  . famotidine (PEPCID) 20 MG tablet TAKE 1 TABLET BY MOUTH EVERY DAY  . haloperidol (HALDOL) 5 MG tablet Take 0.5 tablets (2.5 mg total) by mouth every Monday, Wednesday, and Friday with hemodialysis AND 1 tablet (5 mg total) at bedtime.  . lidocaine-prilocaine (EMLA) cream Apply 1 application every Monday, Wednesday, and Friday topically. At dialysis  . metoprolol tartrate (LOPRESSOR) 25 MG tablet  Take 1 tablet (25 mg total) by mouth 2 (two) times daily.  Marland Kitchen rOPINIRole (REQUIP) 2 MG tablet Take 1 tablet (2 mg total) by mouth daily.  . temazepam  (RESTORIL) 15 MG capsule Take 1 capsule (15 mg total) by mouth at bedtime as needed for sleep.  Marland Kitchen timolol (TIMOPTIC) 0.5 % ophthalmic solution Place 1 drop into both eyes 2 (two) times daily.  Marland Kitchen lidocaine (LIDODERM) 5 % Place 1 patch onto the skin daily. Remove & Discard patch within 12 hours or as directed by MD (Patient not taking: Reported on 02/26/2018)  . multivitamin (RENA-VIT) TABS tablet TAKE 1 TABLET BY MOUTH EVERYDAY AT BEDTIME (Patient taking differently: No sig reported)    FAMILY HISTORY:  Her family history includes Breast cancer in her other; Hypertension in her father; Stroke in her father. There is no history of Colon cancer or Liver disease.  SOCIAL HISTORY: She  reports that she has never smoked. She has never used smokeless tobacco. She reports that she does not drink alcohol or use drugs.  REVIEW OF SYSTEMS:   Unable to obtain due to Unresponsiveness and intubation  SUBJECTIVE:  Unable to obtain due to Unresponsiveness and intubation  VITAL SIGNS: BP 126/61   Pulse 82   Resp (!) 21   Ht 5' 3" (1.6 m)   Wt 68 kg   SpO2 100%   BMI 26.56 kg/m   HEMODYNAMICS:    VENTILATOR SETTINGS: Vent Mode: PRVC FiO2 (%):  [40 %-45 %] 40 % Set Rate:  [12 bmp] 12 bmp Vt Set:  [450 mL] 450 mL PEEP:  [5 cmH20] 5 cmH20  INTAKE / OUTPUT: No intake/output data recorded.  PHYSICAL EXAMINATION: General:  Acutely ill appearing female, laying in bed, intubated, in NAD Neuro:  Unresponsive, no sedation, Pupils PERRL 2 mm sluggish bilaterally HEENT:  Atraumatic, normocephalic, neck supple, ETT in place, No JVD Cardiovascular:  RRR, s1s2, 3/6 murmur Lungs:  Clear bilaterally, no wheezing, even, non-labored, vent assisted Abdomen:  Soft, non-tender, non-distended, BS+ x4 Musculoskeletal:  No deformities, no edema Skin:  Warm/dry. No obvious rashes, lesions, or ulcerations  LABS:  BMET Recent Labs  Lab 02/25/18 1705 03/02/18 1013 03/04/18 1053  NA 141 134* 135  K 3.6 3.8  4.5  CL 101 93* 100  CO2 28 32 27  BUN 20 30* 18  CREATININE 4.40* 4.62* 3.85*  GLUCOSE 119* 93 127*    Electrolytes Recent Labs  Lab 02/25/18 1705 03/02/18 1013 03/04/18 1053  CALCIUM 9.4 9.0 8.4*  PHOS  --  4.0  --     CBC Recent Labs  Lab 02/25/18 1705 03/02/18 1013 03/04/18 1053  WBC 8.2 7.4 6.7  HGB 12.9 10.9* 9.8*  HCT 36.9 30.8* 27.2*  PLT 205 133* 142*    Coag's No results for input(s): APTT, INR in the last 168 hours.  Sepsis Markers Recent Labs  Lab 03/04/18 1053  LATICACIDVEN 1.9    ABG Recent Labs  Lab 03/04/18 1127  PHART 7.58*  PCO2ART 29*  PO2ART 103    Liver Enzymes Recent Labs  Lab 02/25/18 1705 03/02/18 1013 03/04/18 1053  AST 22  --  21  ALT 15  --  15  ALKPHOS 68  --  66  BILITOT 0.6  --  0.7  ALBUMIN 4.2 3.8 3.4*    Cardiac Enzymes No results for input(s): TROPONINI, PROBNP in the last 168 hours.  Glucose Recent Labs  Lab 03/04/18 1103 03/04/18 1237  GLUCAP 132* 125*  Imaging Dg Abdomen 1 View  Result Date: 03/04/2018 CLINICAL DATA:  OG tube advancement. EXAM: ABDOMEN - 1 VIEW COMPARISON:  CT scan March 11, 2017.  Chest x-ray March 04, 2018. FINDINGS: The side port of the OG tube is near the GE junction with the tip in the fundus. The patient may benefit from advancing the tube another 8 cm or so. Cholecystectomy clips. Rounded opacities project over the right upper quadrant, nonspecific. No other acute abnormalities. IMPRESSION: 1. The side port of the OG tube is probably just above the GE junction. The distal tip is within the stomach. Consider advancing up to 8 cm. 2. Rounded radiopaque densities project over the right upper quadrant and could be something on the patient. The patient is status post cholecystectomy which would exclude gallstones. No stones were seen in the right kidney on the previous CT scan. Recommend clinical correlation. Electronically Signed   By: Dorise Bullion III M.D   On: 03/04/2018 11:47    Dg Chest Port 1 View  Result Date: 03/04/2018 CLINICAL DATA:  Intubated. EXAM: PORTABLE CHEST 1 VIEW COMPARISON:  12/15/2017. FINDINGS: Interval endotracheal tube with its tip 2 cm above the carina. This could be retracted 2 cm to place it at the level of the clavicles. Interval nasogastric or orogastric tube with its tip at the gastroesophageal junction and side hole in the distal esophagus. Poor inspiration with stable borderline enlarged cardiac silhouette and clear lungs. Cholecystectomy clips. Left subclavian and axillary stent. Unremarkable bones. IMPRESSION: 1. Endotracheal tube tip 2 cm above the carina. This could be retracted 2 cm to place it at the level of the clavicles. 2. Nasogastric tube tip at the gastroesophageal junction and side hole in the distal esophagus. It is recommended that this be advanced. 3. Stable borderline cardiomegaly. Electronically Signed   By: Claudie Revering M.D.   On: 03/04/2018 11:44     STUDIES:  CXR 03/04/18>> Stable borderline cardiomegaly. UA 03/04/18>> Negative Urine drug screen 03/04/18>> CULTURES: Blood x2 03/04/18>> Urine 03/04/18>>  ANTIBIOTICS:   SIGNIFICANT EVENTS: 03/04/18>> Admitted to [facility] ICU  LINES/TUBES: ETT 03/04/18>>  DISCUSSION: 72 y.o. Female found unresponsive 03/04/18 in setting of drug overdose requiring intubation for airway protection.  Unclear as to what meds she took, and whether this was intentional vs unintentional overdose.    ASSESSMENT / PLAN:  PULMONARY A: Intubated for airway protection P:   PRVC: 8 cc/kg SBT when parameters met; Once awake and able to protect airway will plan to extubate VAP bundle Pulmonary Hygiene Follow intermittent CXR and ABG  CARDIOVASCULAR A:  No active issues Hx: HTN, murmur P:  ICU monitoring Maintain MAP>60 NS @ 50 ml/hr Hold home Metoprolol   RENAL A:   ESRD on HD (M,W,F HD days) P:   Consult Nephrology, appreciate input HD per Nephrology Monitor I&O's / urinary  output Follow BMP Ensure adequate renal perfusion Avoid nephrotoxic agents as able Replace electrolytes as indicated    GASTROINTESTINAL A:   No active issues Hx: GERD, Diverticulitis, colon polyps, pancreatitis P:   NPO Pepcid for SUP  HEMATOLOGIC A:   Anemia without signs of bleeding P:  Monitor for s/sx of bleeding Trend CBC Heparin SQ For VTE prophylaxis Transfuse for Hgb<7  INFECTIOUS A:   No active issues P:   Monitor fever curve Follow WBC  ENDOCRINE A:   No active issues P:   Follow Glucose on BMP  NEUROLOGIC A:   Drug overdose, unclear if accidental vs intentional AMS in setting  of drug overdose Hx: Bipolar disorder, HA P:   RASS goal: 0 to -1 Avoid sedating meds as able Goal is to allow pt to wake up and extubate Current mental status is current barrier to extubation Provide supportive care Lights on during the day Consult Psych once extubated Hold home Carbamazepine, Haldol, Ropinirole, Temazepam   FAMILY  - Updates: No family present at bedside 8/15 during NP rounds.  - Inter-disciplinary family meet or Palliative Care meeting due by:  03/11/18    Darel Hong, AGACNP-BC [facility] Pager: [phone]  03/04/2018, 1:18 PM  PCCM ATTENDING ATTESTATION:  I have evaluated patient with the APP Dewaine Conger, reviewed database in its entirety and discussed care plan in detail. In addition, this patient was discussed on multidisciplinary rounds. I have personally reviewed all chest radiographs discussed herein including CXRs and CT chest unless otherwise indicated  This is a polysubstance overdose with presumed suicidal intent.  She is hypersomnolent, most likely due to temazepam overdose.  There are other medications she might have taken an overdose but she has no obvious cardiovascular adverse consequences.  She also has a history of ESRD.  Important exam findings: RASS -5 HEENT: NCAT, sclerae white Neuro: No  spontaneous movement.  Minimal withdrawal to deep painful stimulation.  Pupils react.  Extraocular movements intact.  DTRs symmetric Chest clear to auscultation percussion Regular, no M Abdomen soft, diminished BS, no masses Extremities warm, no edema  Major problems addressed by PCCM team: Acute respiratory failure due to presumed polysubstance overdose and hypersomnolence Polysubstance overdose with presumed suicidal intent Mild hypotension ESRD  PLAN/REC: Vent settings established Vent bundle implemented Daily SBT as indicated MAP goal > 60 mmHg Monitor BMET intermittently Monitor I/Os Correct electrolytes as indicated Renal consultation requested.  HD days M, W, F NS 50 cc/hr (hypotension) Holding all home meds Avoid sedatives Once she is sufficiently awake to protect her airway, she may be safely extubated Will need psychiatry consultation after extubation   Merton Border, MD PCCM service Mobile [phone] Pager [phone] 03/04/2018 3:14 PM

## 2018-03-04 NOTE — ED Notes (Signed)
Intubation by Dr. Lavonia Drafts 7.5 at 24 at lip, bilateral breath sounds, positive color change

## 2018-03-05 ENCOUNTER — Inpatient Hospital Stay: Payer: Medicare HMO

## 2018-03-05 DIAGNOSIS — R4182 Altered mental status, unspecified: Secondary | ICD-10-CM

## 2018-03-05 DIAGNOSIS — J9600 Acute respiratory failure, unspecified whether with hypoxia or hypercapnia: Secondary | ICD-10-CM

## 2018-03-05 LAB — BLOOD CULTURE ID PANEL (REFLEXED)
ACINETOBACTER BAUMANNII: NOT DETECTED
CANDIDA ALBICANS: NOT DETECTED
CANDIDA GLABRATA: NOT DETECTED
CANDIDA KRUSEI: NOT DETECTED
Candida parapsilosis: NOT DETECTED
Candida tropicalis: NOT DETECTED
ENTEROBACTER CLOACAE COMPLEX: NOT DETECTED
ENTEROBACTERIACEAE SPECIES: NOT DETECTED
ESCHERICHIA COLI: NOT DETECTED
Enterococcus species: NOT DETECTED
Haemophilus influenzae: NOT DETECTED
Klebsiella oxytoca: NOT DETECTED
Klebsiella pneumoniae: NOT DETECTED
Listeria monocytogenes: NOT DETECTED
NEISSERIA MENINGITIDIS: NOT DETECTED
PROTEUS SPECIES: NOT DETECTED
PSEUDOMONAS AERUGINOSA: NOT DETECTED
STAPHYLOCOCCUS SPECIES: NOT DETECTED
STREPTOCOCCUS AGALACTIAE: NOT DETECTED
STREPTOCOCCUS PNEUMONIAE: NOT DETECTED
STREPTOCOCCUS PYOGENES: NOT DETECTED
Serratia marcescens: NOT DETECTED
Staphylococcus aureus (BCID): NOT DETECTED
Streptococcus species: DETECTED — AB

## 2018-03-05 LAB — TROPONIN I: TROPONIN I: 0.05 ng/mL — AB (ref <0.03)

## 2018-03-05 LAB — BASIC METABOLIC PANEL
Anion gap: 9 (ref 5–15)
BUN: 22 mg/dL (ref 8–23)
CALCIUM: 8.7 mg/dL — AB (ref 8.9–10.3)
CO2: 24 mmol/L (ref 22–32)
CREATININE: 4.26 mg/dL — AB (ref 0.44–1.00)
Chloride: 103 mmol/L (ref 98–111)
GFR, EST AFRICAN AMERICAN: 11 mL/min — AB (ref >60)
GFR, EST NON AFRICAN AMERICAN: 10 mL/min — AB (ref >60)
Glucose, Bld: 122 mg/dL — ABNORMAL HIGH (ref 70–99)
Potassium: 4.8 mmol/L (ref 3.5–5.1)
SODIUM: 136 mmol/L (ref 135–145)

## 2018-03-05 LAB — CBC
HCT: 31.2 % — ABNORMAL LOW (ref 35.0–47.0)
Hemoglobin: 10.7 g/dL — ABNORMAL LOW (ref 12.0–16.0)
MCH: 33.7 pg (ref 26.0–34.0)
MCHC: 34.3 g/dL (ref 32.0–36.0)
MCV: 98.3 fL (ref 80.0–100.0)
Platelets: 158 10*3/uL (ref 150–440)
RBC: 3.17 MIL/uL — ABNORMAL LOW (ref 3.80–5.20)
RDW: 13.6 % (ref 11.5–14.5)
WBC: 9.7 10*3/uL (ref 3.6–11.0)

## 2018-03-05 LAB — GLUCOSE, CAPILLARY
GLUCOSE-CAPILLARY: 144 mg/dL — AB (ref 70–99)
Glucose-Capillary: 132 mg/dL — ABNORMAL HIGH (ref 70–99)

## 2018-03-05 LAB — MAGNESIUM: Magnesium: 1.9 mg/dL (ref 1.7–2.4)

## 2018-03-05 LAB — PHOSPHORUS: PHOSPHORUS: 5.2 mg/dL — AB (ref 2.5–4.6)

## 2018-03-05 MED ORDER — CHLORHEXIDINE GLUCONATE CLOTH 2 % EX PADS
6.0000 | MEDICATED_PAD | Freq: Every day | CUTANEOUS | Status: DC
Start: 2018-03-06 — End: 2018-03-10
  Administered 2018-03-06 – 2018-03-08 (×3): 6 via TOPICAL

## 2018-03-05 MED ORDER — VITAL HIGH PROTEIN PO LIQD: 1000.0000 mL | ORAL | Status: DC

## 2018-03-05 MED ORDER — EPOETIN ALFA 10000 UNIT/ML IJ SOLN
4000.0000 [IU] | INTRAMUSCULAR | Status: DC
  Administered 2018-03-08 – 2018-03-10 (×2): 4000 [IU] via INTRAVENOUS
  Filled 2018-03-05: qty 1

## 2018-03-05 MED ORDER — VANCOMYCIN HCL IN DEXTROSE 1-5 GM/200ML-% IV SOLN
1000.0000 mg | Freq: Once | INTRAVENOUS | Status: DC
  Filled 2018-03-05: qty 200

## 2018-03-05 MED ORDER — SODIUM CHLORIDE 0.9 % IV SOLN
2.0000 g | Freq: Every day | INTRAVENOUS | Status: DC
  Administered 2018-03-05 – 2018-03-10 (×6): 2 g via INTRAVENOUS
  Filled 2018-03-05 (×2): qty 2
  Filled 2018-03-05: qty 20
  Filled 2018-03-05 (×4): qty 2

## 2018-03-05 MED ORDER — VITAL 1.5 CAL PO LIQD
1000.0000 mL | ORAL | Status: DC
  Administered 2018-03-05: 1000 mL

## 2018-03-05 NOTE — Progress Notes (Signed)
eeg completed ° °

## 2018-03-05 NOTE — Progress Notes (Signed)
HD Tx complete, tolerated well, no UF ordered.    03/05/18 1615  Vital Signs  Pulse Rate (!) 102  Pulse Rate Source Monitor  Resp 10  BP (!) 131/51  BP Location Right Arm  BP Method Automatic  Patient Position (if appropriate) Lying  Oxygen Therapy  SpO2 100 %  O2 Device Ventilator  End Tidal CO2 (EtCO2) 36  During Hemodialysis Assessment  HD Safety Checks Performed Yes  KECN 74.3 KECN  Dialysis Fluid Bolus Normal Saline  Bolus Amount (mL) 250 mL  Intra-Hemodialysis Comments Tx completed;Tolerated well

## 2018-03-05 NOTE — Procedures (Signed)
ELECTROENCEPHALOGRAM REPORT   Patient: Joyce [name]       Room #: IC04A-AA EEG No. ID: [id number] Age: 72 y.o.        Sex: female Referring Physician: Conforti Report Date:  03/05/2018        Interpreting Physician: Alexis Goodell  History: Dariah Mcsorley is an 72 y.o. female found unresponsive felt possibly secondary to suicide attempt.  Medications:  Rocephin, Epogen, Pepcid, Proamatine, Neosynephrine  Conditions of Recording:  This is a 21 channel routine scalp EEG performed with bipolar and monopolar montages arranged in accordance to the international 10/20 system of electrode placement. One channel was dedicated to EKG recording.  The patient is in the intubated and unresponsive state.  Description:  The record is marred by muscle and movement artifact that often obscures the background rhythm.  When able to be visualized he background activity is poorly organized.  It is consists of a low voltage, polymorphic delta activity that is diffusely distributed and of higher voltage anteriorly.  The fastest posterior rhythm is noted at Hz.  This is poorly sustained.  There is often some superimposed faster rhythms noted as well No epileptiform activity is noted.   Hyperventilation and intermittent photic stimulation were not performed.  IMPRESSION: This is an abnormal EEG secondary to general background slowing.  This finding may be seen with a diffuse disturbance that is etiologically nonspecific, but may include a metabolic encephalopathy, among other possibilities.  No epileptiform activity was noted.     Alexis Goodell, MD Neurology [phone] 03/05/2018, 6:23 PM

## 2018-03-05 NOTE — Progress Notes (Signed)
Post HD Assessment,no change from    03/05/18 1631  Neurological  Level of Consciousness Responds to Pain  Orientation Level Intubated/Tracheostomy - Unable to assess  Cardiac  Pulse Regular  Heart Sounds S1, S2  ECG Monitor Yes  Psychosocial  Psychosocial (WDL) X  Patient Behaviors Not interactive  baseline,pt tolerated tx well

## 2018-03-05 NOTE — Consult Note (Signed)
Reason for Consult:AMS Referring Physician: Con[place]i  CC: AMS  HPI: Joyce [name] is an 72 y.o. female with a history of depression who is unable to provide history due to mental status.  Family unavailable at this time therefore all history obtained from the chart.  Apparently mother went to check on patient today but there was no answer when she knocked on her door.  The police were called and found the patient unresponsive on the floor.  Apparently lots of pills were scattered around her.  Patient was brought in for evaluation.  Required intubation.  Is not sedated and has not been responsive.  Consult called for further recommendations.    Past Medical History:  Diagnosis Date  . Anal fissure   . Bipolar affective disorder ([place])   . CKD (chronic kidney disease)    Dr Holley Raring [name] 4  . Colon polyps   . Diverticulitis   . Diverticulitis   . Family history of adverse reaction to anesthesia    mom - PONV  . GERD (gastroesophageal reflux disease)   . Headache    migraines - none over 10 yrs  . Heart murmur   . History of hiatal hernia   . Hypertension   . Pancreatitis    Valproic acid  . Vertigo     Past Surgical History:  Procedure Laterality Date  . A/V FISTULAGRAM Left 04/14/2017   Procedure: A/V Fistulagram;  Surgeon: Katha Cabal, MD;  Location: [facility];  Service: Cardiovascular;  Laterality: Left;  . A/V FISTULAGRAM Left 06/09/2017   Procedure: A/V FISTULAGRAM;  Surgeon: Katha Cabal, MD;  Location: [facility];  Service: Cardiovascular;  Laterality: Left;  . A/V FISTULAGRAM Left 11/02/2017   Procedure: A/V FISTULAGRAM;  Surgeon: Algernon Huxley, MD;  Location: [facility];  Service: Cardiovascular;  Laterality: Left;  . A/V SHUNT INTERVENTION N/A 11/02/2017   Procedure: A/V SHUNT INTERVENTION;  Surgeon: Algernon Huxley, MD;  Location: [facility];  Service: Cardiovascular;  Laterality: N/A;  . ABDOMINAL HYSTERECTOMY  1990 ?   . AV FISTULA PLACEMENT  4/30  . BREAST EXCISIONAL BIOPSY Left 1994   neg surgical bx  . CHOLECYSTECTOMY  2003  . COLONOSCOPY  2014   Dr. Jamal Collin  . COLONOSCOPY WITH PROPOFOL N/A 09/24/2015   Procedure: COLONOSCOPY WITH random colon byopies.;  Surgeon: Lucilla Lame, MD;  Location: [facility];  Service: Endoscopy;  Laterality: N/A;  . ESOPHAGOGASTRODUODENOSCOPY (EGD) WITH PROPOFOL N/A 09/24/2015   Procedure: ESOPHAGOGASTRODUODENOSCOPY (EGD) ;  Surgeon: Lucilla Lame, MD;  Location: [facility];  Service: Endoscopy;  Laterality: N/A;  . EYE SURGERY    . PERIPHERAL VASCULAR CATHETERIZATION N/A 05/29/2015   Procedure: A/V Shuntogram/Fistulagram;  Surgeon: Katha Cabal, MD;  Location: [facility];  Service: Cardiovascular;  Laterality: N/A;  . PERIPHERAL VASCULAR CATHETERIZATION N/A 05/29/2015   Procedure: A/V Shunt Intervention;  Surgeon: Katha Cabal, MD;  Location: [facility];  Service: Cardiovascular;  Laterality: N/A;  . POLYPECTOMY  09/24/2015   Procedure: POLYPECTOMY INTESTINAL;  Surgeon: Lucilla Lame, MD;  Location: [facility];  Service: Endoscopy;;  cecal polyp ascending polyp    Family History  Problem Relation Age of Onset  . Stroke Father   . Hypertension Father   . Breast cancer Other   . Colon cancer Neg Hx   . Liver disease Neg Hx     Social History:  reports that she has never  smoked. She has never used smokeless tobacco. She reports that she does not drink alcohol or use drugs.  Allergies  Allergen Reactions  . Morphine And Related Shortness Of Breath    Pt reports chest pain and difficulty breathing.  . Indomethacin Hives  . Pollen Extract Other (See Comments)    Sinus problems and HA    Medications:  I have reviewed the patient's current medications. Prior to Admission:  Medications Prior to Admission  Medication Sig Dispense Refill Last Dose  . acetaminophen (TYLENOL) 325 MG tablet Take 325 mg 2 (two) times daily  as needed by mouth for moderate pain or headache.    PRN at PRN  . brimonidine (ALPHAGAN) 0.2 % ophthalmic solution Place 1 drop into both eyes 2 (two) times daily. 5 mL 1 Unknown at Unknown  . carbamazepine (TEGRETOL) 200 MG tablet Take 1 tablet (200 mg total) by mouth 3 (three) times daily. 90 tablet 3   . cholecalciferol (VITAMIN D) 1000 UNITS tablet Take 1,000 Units by mouth daily.   Unknown at Unknown  . cyanocobalamin (,VITAMIN B-12,) 1000 MCG/ML injection Inject 1,000 mcg into the muscle every 30 (thirty) days.   Unknown at Unknown  . dicyclomine (BENTYL) 20 MG tablet Take 1 tablet (20 mg total) by mouth 3 (three) times daily before meals. 90 tablet 1 Unknown at Unknown  . famotidine (PEPCID) 20 MG tablet TAKE 1 TABLET BY MOUTH EVERY DAY 30 tablet 1 Unknown at Unknown  . haloperidol (HALDOL) 5 MG tablet Take 0.5 tablets (2.5 mg total) by mouth every Monday, Wednesday, and Friday with hemodialysis AND 1 tablet (5 mg total) at bedtime. 36 tablet 3   . lidocaine-prilocaine (EMLA) cream Apply 1 application every Monday, Wednesday, and Friday topically. At dialysis  3 PRN at PRN  . metoprolol tartrate (LOPRESSOR) 25 MG tablet Take 1 tablet (25 mg total) by mouth 2 (two) times daily. 60 tablet 1   . rOPINIRole (REQUIP) 2 MG tablet Take 1 tablet (2 mg total) by mouth daily. 30 tablet 3   . temazepam (RESTORIL) 15 MG capsule Take 1 capsule (15 mg total) by mouth at bedtime as needed for sleep. 30 capsule 0 PRN at PRN  . timolol (TIMOPTIC) 0.5 % ophthalmic solution Place 1 drop into both eyes 2 (two) times daily. 10 mL 1 Unknown at Unknown  . lidocaine (LIDODERM) 5 % Place 1 patch onto the skin daily. Remove & Discard patch within 12 hours or as directed by MD (Patient not taking: Reported on 02/26/2018) 30 patch 0 Not Taking at Unknown time  . multivitamin (RENA-VIT) TABS tablet TAKE 1 TABLET BY MOUTH EVERYDAY AT BEDTIME (Patient taking differently: No sig reported) 30 tablet 0 Unknown at Unknown    Scheduled: . chlorhexidine gluconate (MEDLINE KIT)  15 mL Mouth Rinse BID  . [START ON 03/06/2018] Chlorhexidine Gluconate Cloth  6 each Topical Q0600  . [START ON 03/08/2018] epoetin (EPOGEN/PROCRIT) injection  4,000 Units Intravenous Q M,W,F-HD  . heparin  5,000 Units Subcutaneous Q8H  . mouth rinse  15 mL Mouth Rinse 10 times per day  . midodrine  5 mg Oral BID WC    ROS: Unable to obtain due to mental status  Physical Examination: Blood pressure (!) 135/53, pulse 98, temperature 99.6 F (37.6 C), temperature source Axillary, resp. rate 14, height '5\' 3"'$  (1.6 m), weight 69.9 kg, SpO2 100 %.  HEENT-  Normocephalic, no lesions, without obvious abnormality.  Normal external eye and conjunctiva.  Normal TM's bilaterally.  Normal auditory canals and external ears. Normal external nose, mucus membranes and septum.  Normal pharynx. Cardiovascular- S1, S2 normal, pulses palpable throughout   Lungs- chest clear, no wheezing, rales, normal symmetric air entry Abdomen- soft, non-tender; bowel sounds normal; no masses,  no organomegaly Extremities- no edema Lymph-no adenopathy palpable Musculoskeletal-no joint tenderness, deformity or swelling Skin-warm and dry, no hyperpigmentation, vitiligo, or suspicious lesions  Neurological Examination  Mental Status: Patient does not respond to verbal stimuli.  Does not respond to deep sternal rub.  Does not follow commands.  No verbalizations noted.  Cranial Nerves: II: patient does not respond confrontation bilaterally, pupils right 4 mm, left 3 mm,and unreactive bilaterally III,IV,VI: doll's response absent bilaterally.  V,VII: corneal reflex absent bilaterally  VIII: patient does not respond to verbal stimuli IX,X: gag reflex absent, XI: trapezius strength unable to test bilaterally XII: tongue strength unable to test Motor: Extremities flaccid throughout.  No spontaneous movement noted.  No purposeful movements noted. Sensory: Does not  respond to noxious stimuli in any extremity. Deep Tendon Reflexes:  Absent throughout. Plantars: Mute bilaterally Cerebellar: Unable to perform   Laboratory Studies:   Basic Metabolic Panel: Recent Labs  Lab 03/02/18 1013 03/04/18 1053 03/04/18 1341 03/05/18 0203  NA 134* 135  --  136  K 3.8 4.5  --  4.8  CL 93* 100  --  103  CO2 32 27  --  24  GLUCOSE 93 127*  --  122*  BUN 30* 18  --  22  CREATININE 4.62* 3.85*  --  4.26*  CALCIUM 9.0 8.4*  --  8.7*  MG  --   --  2.0 1.9  PHOS 4.0  --  3.5 5.2*    Liver Function Tests: Recent Labs  Lab 03/02/18 1013 03/04/18 1053  AST  --  21  ALT  --  15  ALKPHOS  --  66  BILITOT  --  0.7  PROT  --  5.7*  ALBUMIN 3.8 3.4*   No results for input(s): LIPASE, AMYLASE in the last 168 hours. No results for input(s): AMMONIA in the last 168 hours.  CBC: Recent Labs  Lab 03/02/18 1013 03/04/18 1053 03/05/18 0203  WBC 7.4 6.7 9.7  HGB 10.9* 9.8* 10.7*  HCT 30.8* 27.2* 31.2*  MCV 96.6 95.6 98.3  PLT 133* 142* 158    Cardiac Enzymes: Recent Labs  Lab 03/05/18 1817  TROPONINI 0.05*    BNP: Invalid input(s): POCBNP  CBG: Recent Labs  Lab 03/04/18 1103 03/04/18 1237 03/05/18 1155 03/05/18 1946  GLUCAP 132* 125* 132* 144*    Microbiology: Results for orders placed or performed during the hospital encounter of 03/04/18  Blood Cultures (routine x 2)     Status: None (Preliminary result)   Collection Time: 03/04/18 11:18 AM  Result Value Ref Range Status   Specimen Description   Final    BLOOD RIGHT ARM Performed at [facility], [place] [address], [place], [place] [address]    Special Requests   Final    BOTTLES DRAWN AEROBIC AND ANAEROBIC Blood Culture adequate volume Performed at [facility], [address] [place], [place] [address]    Culture  Setup Time   Final    GRAM POSITIVE COCCI AEROBIC BOTTLE ONLY CRITICAL RESULT CALLED TO, READ BACK BY AND VERIFIED WITH: DAVID BESANTI ON  03/05/18 AT 0109 JAG Performed at [facility], [place] [address], [place], [place] [address]    Culture GRAM POSITIVE  COCCI  Final   Report Status PENDING  Incomplete  Blood Culture ID Panel (Reflexed)     Status: Abnormal   Collection Time: 03/04/18 11:18 AM  Result Value Ref Range Status   Enterococcus species NOT DETECTED NOT DETECTED Final   Listeria monocytogenes NOT DETECTED NOT DETECTED Final   Staphylococcus species NOT DETECTED NOT DETECTED Final   Staphylococcus aureus NOT DETECTED NOT DETECTED Final   Streptococcus species DETECTED (A) NOT DETECTED Final    Comment: Not Enterococcus species, Streptococcus agalactiae, Streptococcus pyogenes, or Streptococcus pneumoniae. CRITICAL RESULT CALLED TO, READ BACK BY AND VERIFIED WITH: DAVID BESANTI ON 03/05/18 AT 0109 BY JAG    Streptococcus agalactiae NOT DETECTED NOT DETECTED Final   Streptococcus pneumoniae NOT DETECTED NOT DETECTED Final   Streptococcus pyogenes NOT DETECTED NOT DETECTED Final   Acinetobacter baumannii NOT DETECTED NOT DETECTED Final   Enterobacteriaceae species NOT DETECTED NOT DETECTED Final   Enterobacter cloacae complex NOT DETECTED NOT DETECTED Final   Escherichia coli NOT DETECTED NOT DETECTED Final   Klebsiella oxytoca NOT DETECTED NOT DETECTED Final   Klebsiella pneumoniae NOT DETECTED NOT DETECTED Final   Proteus species NOT DETECTED NOT DETECTED Final   Serratia marcescens NOT DETECTED NOT DETECTED Final   Haemophilus influenzae NOT DETECTED NOT DETECTED Final   Neisseria meningitidis NOT DETECTED NOT DETECTED Final   Pseudomonas aeruginosa NOT DETECTED NOT DETECTED Final   Candida albicans NOT DETECTED NOT DETECTED Final   Candida glabrata NOT DETECTED NOT DETECTED Final   Candida krusei NOT DETECTED NOT DETECTED Final   Candida parapsilosis NOT DETECTED NOT DETECTED Final   Candida tropicalis NOT DETECTED NOT DETECTED Final    Comment: Performed at [facility], [address] [place], [place] [address]  MRSA PCR Screening     Status: None   Collection Time: 03/04/18 12:37 PM  Result Value Ref Range Status   MRSA by PCR NEGATIVE NEGATIVE Final    Comment:        The GeneXpert MRSA Assay (FDA approved for NASAL specimens only), is one component of a comprehensive MRSA colonization surveillance program. It is not intended to diagnose MRSA infection nor to guide or monitor treatment for MRSA infections. Performed at [facility], [address] [place], [place] [address]   Culture, blood (Routine X 2) w Reflex to ID Panel     Status: None (Preliminary result)   Collection Time: 03/05/18  2:02 AM  Result Value Ref Range Status   Specimen Description BLOOD RIGHT HAND  Final   Special Requests   Final    BOTTLES DRAWN AEROBIC ONLY Blood Culture results may not be optimal due to an inadequate volume of blood received in culture bottles   Culture   Final    NO GROWTH < 12 HOURS Performed at [facility], [address] [place], [place] [address]    Report Status PENDING  Incomplete  Culture, blood (Routine X 2) w Reflex to ID Panel     Status: None (Preliminary result)   Collection Time: 03/05/18  2:03 AM  Result Value Ref Range Status   Specimen Description BLOOD RIGHT HAND  Final   Special Requests   Final    BOTTLES DRAWN AEROBIC ONLY Blood Culture results may not be optimal due to an inadequate volume of blood received in culture bottles   Culture   Final    NO GROWTH < 12 HOURS Performed at [facility], [address] [place],  [place] [address]    Report Status PENDING  Incomplete    Coagulation Studies: No results for input(s): LABPROT, INR in the last 72 hours.  Urinalysis:  Recent Labs  Lab 03/04/18 1102  COLORURINE YELLOW*  LABSPEC 1.008  PHURINE 7.0  GLUCOSEU NEGATIVE  HGBUR NEGATIVE  BILIRUBINUR NEGATIVE  KETONESUR NEGATIVE  PROTEINUR NEGATIVE  NITRITE NEGATIVE  LEUKOCYTESUR NEGATIVE    Lipid  Panel:     Component Value Date/Time   CHOL 251 (H) 09/18/2017 0646   CHOL 105 05/14/2014 0412   TRIG 124 09/18/2017 0646   TRIG 127 05/14/2014 0412   HDL 66 09/18/2017 0646   HDL 37 (L) 05/14/2014 0412   CHOLHDL 3.8 09/18/2017 0646   VLDL 25 09/18/2017 0646   VLDL 25 05/14/2014 0412   LDLCALC 160 (H) 09/18/2017 0646   LDLCALC 43 05/14/2014 0412    HgbA1C:  Lab Results  Component Value Date   HGBA1C 4.8 09/18/2017    Urine Drug Screen:      Component Value Date/Time   LABOPIA NONE DETECTED 03/04/2018 1103   COCAINSCRNUR NONE DETECTED 03/04/2018 1103   LABBENZ TEST NOT PERFORMED, REAGENT NOT AVAILABLE (A) 03/04/2018 1103   AMPHETMU NONE DETECTED 03/04/2018 1103   THCU NONE DETECTED 03/04/2018 1103   LABBARB NONE DETECTED 03/04/2018 1103    Alcohol Level:  Recent Labs  Lab 03/04/18 1341  ETH <10     Imaging: Dg Abdomen 1 View  Result Date: 03/04/2018 CLINICAL DATA:  OG tube advancement. EXAM: ABDOMEN - 1 VIEW COMPARISON:  CT scan March 11, 2017.  Chest x-ray March 04, 2018. FINDINGS: The side port of the OG tube is near the GE junction with the tip in the fundus. The patient may benefit from advancing the tube another 8 cm or so. Cholecystectomy clips. Rounded opacities project over the right upper quadrant, nonspecific. No other acute abnormalities. IMPRESSION: 1. The side port of the OG tube is probably just above the GE junction. The distal tip is within the stomach. Consider advancing up to 8 cm. 2. Rounded radiopaque densities project over the right upper quadrant and could be something on the patient. The patient is status post cholecystectomy which would exclude gallstones. No stones were seen in the right kidney on the previous CT scan. Recommend clinical correlation. Electronically Signed   By: Dorise Bullion III M.D   On: 03/04/2018 11:47   Ct Head Wo Contrast  Result Date: 03/05/2018 CLINICAL DATA:  Patient found down yesterday. Possible drug overdose. EXAM:  CT HEAD WITHOUT CONTRAST TECHNIQUE: Contiguous axial images were obtained from the base of the skull through the vertex without intravenous contrast. COMPARISON:  Head CT scan 09/22/2017.  Brain MRI 09/02/2017. FINDINGS: Brain: No evidence of acute infarction, hemorrhage, hydrocephalus, extra-axial collection or mass lesion/mass effect. Cortical atrophy is noted. Vascular: No hyperdense vessel or unexpected calcification. Skull: Intact.  No focal lesion. Sinuses/Orbits: No acute or focal abnormality. Status post lens extraction. Other: None. IMPRESSION: No acute abnormality. Cortical atrophy. Electronically Signed   By: Inge Rise M.D.   On: 03/05/2018 11:22   Dg Chest Port 1 View  Result Date: 03/05/2018 CLINICAL DATA:  Respiratory failure EXAM: PORTABLE CHEST 1 VIEW COMPARISON:  03/04/2018 FINDINGS: Cardiac shadow is mildly enlarged but stable. Nasogastric catheter is noted within the stomach. Endotracheal tube is noted at the level of the carina. This should be withdrawn 2-3 cm. The lungs are clear. Vascular stent is noted on the left and stable. IMPRESSION: Endotracheal tube at  the level of the carina. This should be withdrawn 2-3 cm. No other focal abnormality is noted. Electronically Signed   By: Inez Catalina M.D.   On: 03/05/2018 03:19   Dg Chest Port 1 View  Result Date: 03/04/2018 CLINICAL DATA:  Intubated. EXAM: PORTABLE CHEST 1 VIEW COMPARISON:  12/15/2017. FINDINGS: Interval endotracheal tube with its tip 2 cm above the carina. This could be retracted 2 cm to place it at the level of the clavicles. Interval nasogastric or orogastric tube with its tip at the gastroesophageal junction and side hole in the distal esophagus. Poor inspiration with stable borderline enlarged cardiac silhouette and clear lungs. Cholecystectomy clips. Left subclavian and axillary stent. Unremarkable bones. IMPRESSION: 1. Endotracheal tube tip 2 cm above the carina. This could be retracted 2 cm to place it at the  level of the clavicles. 2. Nasogastric tube tip at the gastroesophageal junction and side hole in the distal esophagus. It is recommended that this be advanced. 3. Stable borderline cardiomegaly. Electronically Signed   By: Claudie Revering M.D.   On: 03/04/2018 11:44     Assessment/Plan: 73 year old female presenting after what was felt to be an intentional overdose.  Patient now unresponsive.  No evidence of volitional or brain stem responses.  Head CT reviewed and unremarkable.  EEG slow.  There is evidence of AKI.  Hepatic function appears preserved.  UDS negative.  Unclear what patient may have taken.  There are some ingestions that can give the appearance of significant brain injury but can resolve with clearance.  Would continue current management.  If no improvement clinically would consider MRI of the brain.   Will continue to follow with you.    Case discussed with Dr. Paticia Stack, MD Neurology [phone] 03/05/2018, 7:51 PM

## 2018-03-05 NOTE — Progress Notes (Signed)
[facility] [place] NAME: Joyce [name]    MR#:  [id number]  DATE OF BIRTH:  11-19-1945  SUBJECTIVE:  CHIEF COMPLAINT:   Chief Complaint  Patient presents with  . Other    Unresponsive   -Patient with bacteremia, and unresponsive at home, intubated.  Just discharged from psychiatry unit 03/03/18  REVIEW OF SYSTEMS:  Review of Systems  Unable to perform ROS: Critical illness    DRUG ALLERGIES:   Allergies  Allergen Reactions  . Morphine And Related Shortness Of Breath    Pt reports chest pain and difficulty breathing.  . Indomethacin Hives  . Pollen Extract Other (See Comments)    Sinus problems and HA    VITALS:  Blood pressure (!) 128/45, pulse 76, temperature 99.8 F (37.7 C), temperature source Axillary, resp. rate 12, height 5\' 3"  (1.6 m), weight 69.9 kg, SpO2 98 %.  PHYSICAL EXAMINATION:  Physical Exam   GENERAL:  72 y.o.-year-old patient lying in the bed with no acute distress. Critically ill appearing EYES: Pupils equal, round, reactive to light and accommodation. No scleral icterus. Extraocular muscles intact.  HEENT: Head atraumatic, normocephalic. Oropharynx and nasopharynx clear.  NECK:  Supple, no jugular venous distention. No thyroid enlargement, no tenderness.  LUNGS: Normal breath sounds bilaterally, no wheezing, rales,rhonchi or crepitation. No use of accessory muscles of respiration.  CARDIOVASCULAR: S1, S2 normal. No murmurs, rubs, or gallops.  ABDOMEN: Soft, nontender, nondistended. Bowel sounds present. No organomegaly or mass.  EXTREMITIES: No pedal edema, cyanosis, or clubbing.  Left arm AV fistula NEUROLOGIC: no facial droop, some movements of LUE and LLE noted. Not following commands No posturing  PSYCHIATRIC: The patient is not responding  SKIN: No obvious rash, lesion, or ulcer.    LABORATORY PANEL:   CBC Recent Labs  Lab 03/05/18 0203  WBC 9.7  HGB 10.7*  HCT 31.2*  PLT 158    ------------------------------------------------------------------------------------------------------------------  Chemistries  Recent Labs  Lab 03/04/18 1053  03/05/18 0203  NA 135  --  136  K 4.5  --  4.8  CL 100  --  103  CO2 27  --  24  GLUCOSE 127*  --  122*  BUN 18  --  22  CREATININE 3.85*  --  4.26*  CALCIUM 8.4*  --  8.7*  MG  --    < > 1.9  AST 21  --   --   ALT 15  --   --   ALKPHOS 66  --   --   BILITOT 0.7  --   --    < > = values in this interval not displayed.   ------------------------------------------------------------------------------------------------------------------  Cardiac Enzymes No results for input(s): TROPONINI in the last 168 hours. ------------------------------------------------------------------------------------------------------------------  RADIOLOGY:  Dg Abdomen 1 View  Result Date: 03/04/2018 CLINICAL DATA:  OG tube advancement. EXAM: ABDOMEN - 1 VIEW COMPARISON:  CT scan March 11, 2017.  Chest x-ray March 04, 2018. FINDINGS: The side port of the OG tube is near the GE junction with the tip in the fundus. The patient may benefit from advancing the tube another 8 cm or so. Cholecystectomy clips. Rounded opacities project over the right upper quadrant, nonspecific. No other acute abnormalities. IMPRESSION: 1. The side port of the OG tube is probably just above the GE junction. The distal tip is within the stomach. Consider advancing up to 8 cm. 2. Rounded radiopaque densities project over the right upper quadrant and could be  something on the patient. The patient is status post cholecystectomy which would exclude gallstones. No stones were seen in the right kidney on the previous CT scan. Recommend clinical correlation. Electronically Signed   By: Dorise Bullion III M.D   On: 03/04/2018 11:47   Ct Head Wo Contrast  Result Date: 03/05/2018 CLINICAL DATA:  Patient found down yesterday. Possible drug overdose. EXAM: CT HEAD WITHOUT CONTRAST  TECHNIQUE: Contiguous axial images were obtained from the base of the skull through the vertex without intravenous contrast. COMPARISON:  Head CT scan 09/22/2017.  Brain MRI 09/02/2017. FINDINGS: Brain: No evidence of acute infarction, hemorrhage, hydrocephalus, extra-axial collection or mass lesion/mass effect. Cortical atrophy is noted. Vascular: No hyperdense vessel or unexpected calcification. Skull: Intact.  No focal lesion. Sinuses/Orbits: No acute or focal abnormality. Status post lens extraction. Other: None. IMPRESSION: No acute abnormality. Cortical atrophy. Electronically Signed   By: Inge Rise M.D.   On: 03/05/2018 11:22   Dg Chest Port 1 View  Result Date: 03/05/2018 CLINICAL DATA:  Respiratory failure EXAM: PORTABLE CHEST 1 VIEW COMPARISON:  03/04/2018 FINDINGS: Cardiac shadow is mildly enlarged but stable. Nasogastric catheter is noted within the stomach. Endotracheal tube is noted at the level of the carina. This should be withdrawn 2-3 cm. The lungs are clear. Vascular stent is noted on the left and stable. IMPRESSION: Endotracheal tube at the level of the carina. This should be withdrawn 2-3 cm. No other focal abnormality is noted. Electronically Signed   By: Inez Catalina M.D.   On: 03/05/2018 03:19   Dg Chest Port 1 View  Result Date: 03/04/2018 CLINICAL DATA:  Intubated. EXAM: PORTABLE CHEST 1 VIEW COMPARISON:  12/15/2017. FINDINGS: Interval endotracheal tube with its tip 2 cm above the carina. This could be retracted 2 cm to place it at the level of the clavicles. Interval nasogastric or orogastric tube with its tip at the gastroesophageal junction and side hole in the distal esophagus. Poor inspiration with stable borderline enlarged cardiac silhouette and clear lungs. Cholecystectomy clips. Left subclavian and axillary stent. Unremarkable bones. IMPRESSION: 1. Endotracheal tube tip 2 cm above the carina. This could be retracted 2 cm to place it at the level of the clavicles. 2.  Nasogastric tube tip at the gastroesophageal junction and side hole in the distal esophagus. It is recommended that this be advanced. 3. Stable borderline cardiomegaly. Electronically Signed   By: Claudie Revering M.D.   On: 03/04/2018 11:44    EKG:   Orders placed or performed during the hospital encounter of 03/04/18  . ED EKG  . ED EKG  . ED EKG  . ED EKG    ASSESSMENT AND PLAN:   72 year old female with past medical history significant for bipolar disorder who was just discharged from [facility] unit on 03/03/2018, history of end-stage renal disease on hemodialysis, hypertension, polycystic kidney disease presents to hospital secondary to an unresponsive episode  1.  Unresponsiveness-likely metabolic or toxic encephalopathy -Currently not on sedation.  Slowly seeing some spontaneous activity on the left side. -Continue to monitor off sedation. -Recommend CT of the head.  2.  End-stage renal disease on dialysis-appreciate nephrology consult.  On Monday, Wednesday and Friday schedule.  Exline-has a left AV fistula.  For dialysis today  3.  Bacteremia-positive blood cultures with gram-positive cocci.  Contaminant versus true infection. -Currently on Rocephin as patient is identified as Streptococcus.  4.  Bipolar disorder-concern for drug overdose.  Intentional versus unintentional. -After extubation, will need psychiatry  consult -Psych meds on hold today.  5.  Anemia of chronic disease-continue EPO with dialysis.  6.  DVT prophylaxis-on subcutaneous heparin   Updated sister at bedside   All the records are reviewed and case discussed with Care Management/Social Workerr. Management plans discussed with the patient, family and they are in agreement.  CODE STATUS: Full Code  TOTAL TIME TAKING CARE OF THIS PATIENT: 39 minutes.   POSSIBLE D/C IN 2-3 DAYS, DEPENDING ON CLINICAL CONDITION.   Gladstone Lighter M.D on 03/05/2018 at 1:47 PM  Between 7am to 6pm -  Pager - [phone]  After 6pm go to [email or web address] - password [email or web address] [email or web address]  Office  [phone]  CC: Primary care physician; Cletis Athens, MD

## 2018-03-05 NOTE — Progress Notes (Signed)
Post HD Tx    03/05/18 1630  Hand-Off documentation  Report given to (Full Name) Farris Has, RN   Report received from (Full Name) Beatris Ship, RN   Vital Signs  Temp 99.6 F (37.6 C)  Temp Source Axillary  Pulse Rate 100  Pulse Rate Source Monitor  Resp 10  BP (!) 131/47  BP Location Right Arm  BP Method Automatic  Patient Position (if appropriate) Lying  Oxygen Therapy  SpO2 99 %  O2 Device Ventilator  End Tidal CO2 (EtCO2) 35  Pain Assessment  Pain Scale CPOT  Pain Score 0  Post-Hemodialysis Assessment  Rinseback Volume (mL) 250 mL  KECN 74.3 V  Dialyzer Clearance Lightly streaked  Duration of HD Treatment -hour(s) 3.5 hour(s)  Hemodialysis Intake (mL) 500 mL  UF Total -Machine (mL) 500 mL  Net UF (mL) 0 mL  Tolerated HD Treatment Yes  AVG/AVF Arterial Site Held (minutes) 10 minutes  AVG/AVF Venous Site Held (minutes) 5 minutes  Fistula / Graft Left Upper arm Arteriovenous fistula  Placement Date: 08/13/15   Placed prior to admission: Yes  Orientation: Left  Access Location: Upper arm  Access Type: Arteriovenous fistula  Site Condition No complications  Fistula / Graft Assessment Present;Thrill;Bruit  Status Deaccessed  Drainage Description None

## 2018-03-05 NOTE — Progress Notes (Signed)
Follow up - Critical Care Medicine Note  Patient Details:    Joyce [name] is an 72 y.o. female. with a past medical history Remarkable for bipolar disorder, chronic renal failure, on hemodialysis, Monday once a Friday,, who presented to [facility] ED on 03/04/18 after being found unresponsive by her mother. The patient was found with pills scattered around her in the am 8/15.  Family had last talked to the patient at 1700 on 03/03/18, of which she seemed in normal spirits.   At this time, it is unclear what pills she ingested, and whether this was an intentional or unintentional overdose. It is noted that she was discharged from [facility] ward yesterday 03/03/18.  Review of home medications indicates she takes Carbamazepine, Haloperidol, Temazepam, and Ropinirole  In the ED, she was intubated for airway protection.  Lines, Airways, Drains: Airway (Active)     Airway (Active)     Airway (Active)     Airway 7.5 mm (Active)  Secured at (cm) 24 cm 03/05/2018  7:19 AM  Measured From Lips 03/05/2018  7:19 AM  Secured Location Left 03/05/2018  7:19 AM  Secured By [employer] 03/05/2018  7:19 AM  Tube Holder Repositioned Yes 03/05/2018  4:10 AM  Cuff Pressure (cm H2O) 25 cm H2O 03/04/2018  7:25 PM  Site Condition Dry 03/05/2018  7:19 AM     NG/OG Tube Orogastric 14 Fr. Center mouth Aucultation Measured external length of tube (Active)  External Length of Tube (cm) - (if applicable) 55 cm 2/54/2706  4:00 PM  Site Assessment Clean;Dry 03/05/2018  4:10 AM  Ongoing Placement Verification No acute changes, not attributed to clinical condition;No change in respiratory status 03/04/2018  4:00 PM  Status Clamped 03/04/2018  7:40 PM  Amount of suction 100 mmHg 03/04/2018  4:00 PM  Drainage Appearance Green;Clear 03/04/2018  4:00 PM  Output (mL) 30 mL 03/05/2018  5:00 AM     Urethral Catheter Cassie, RN 14 Fr. (Active)  Indication for Insertion or Continuance of Catheter Unstable critical patients  (first 24-48 hours) 03/05/2018  4:10 AM  Site Assessment Clean;Intact 03/05/2018  4:10 AM  Catheter Maintenance Bag below level of bladder;Drainage bag/tubing not touching floor;Catheter secured;No dependent loops;Seal intact 03/05/2018  4:10 AM  Collection Container Standard drainage bag 03/05/2018  4:10 AM  Securement Method Securing device (Describe) 03/05/2018  4:10 AM  Urinary Catheter Interventions Unclamped 03/05/2018  4:10 AM  Output (mL) 175 mL 03/04/2018  6:05 PM    Anti-infectives:  Anti-infectives (From admission, onward)   None      Microbiology: Results for orders placed or performed during the hospital encounter of 03/04/18  Blood Cultures (routine x 2)     Status: None (Preliminary result)   Collection Time: 03/04/18 11:18 AM  Result Value Ref Range Status   Specimen Description   Final    BLOOD RIGHT ARM Performed at [facility], [address] [place], [place] [address]    Special Requests   Final    BOTTLES DRAWN AEROBIC AND ANAEROBIC Blood Culture adequate volume   Culture  Setup Time   Final    Organism ID to follow GRAM POSITIVE COCCI AEROBIC BOTTLE ONLY CRITICAL RESULT CALLED TO, READ BACK BY AND VERIFIED WITH: DAVID BESANTI ON 03/05/18 AT 0109 JAG Performed at [facility], [address] [place], [place] [address]    Culture [place]  Final   Report Status PENDING  Incomplete  Blood Culture ID Panel (Reflexed)  Status: Abnormal   Collection Time: 03/04/18 11:18 AM  Result Value Ref Range Status   Enterococcus species NOT DETECTED NOT DETECTED Final   Listeria monocytogenes NOT DETECTED NOT DETECTED Final   Staphylococcus species NOT DETECTED NOT DETECTED Final   Staphylococcus aureus NOT DETECTED NOT DETECTED Final   Streptococcus species DETECTED (A) NOT DETECTED Final    Comment: Not Enterococcus species, Streptococcus agalactiae, Streptococcus pyogenes, or Streptococcus pneumoniae. CRITICAL RESULT CALLED TO, READ BACK  BY AND VERIFIED WITH: DAVID BESANTI ON 03/05/18 AT 0109 BY JAG    Streptococcus agalactiae NOT DETECTED NOT DETECTED Final   Streptococcus pneumoniae NOT DETECTED NOT DETECTED Final   Streptococcus pyogenes NOT DETECTED NOT DETECTED Final   Acinetobacter baumannii NOT DETECTED NOT DETECTED Final   Enterobacteriaceae species NOT DETECTED NOT DETECTED Final   Enterobacter cloacae complex NOT DETECTED NOT DETECTED Final   Escherichia coli NOT DETECTED NOT DETECTED Final   Klebsiella oxytoca NOT DETECTED NOT DETECTED Final   Klebsiella pneumoniae NOT DETECTED NOT DETECTED Final   Proteus species NOT DETECTED NOT DETECTED Final   Serratia marcescens NOT DETECTED NOT DETECTED Final   Haemophilus influenzae NOT DETECTED NOT DETECTED Final   Neisseria meningitidis NOT DETECTED NOT DETECTED Final   Pseudomonas aeruginosa NOT DETECTED NOT DETECTED Final   Candida albicans NOT DETECTED NOT DETECTED Final   Candida glabrata NOT DETECTED NOT DETECTED Final   Candida krusei NOT DETECTED NOT DETECTED Final   Candida parapsilosis NOT DETECTED NOT DETECTED Final   Candida tropicalis NOT DETECTED NOT DETECTED Final    Comment: Performed at [facility], [address] [place], [place] [address]  MRSA PCR Screening     Status: None   Collection Time: 03/04/18 12:37 PM  Result Value Ref Range Status   MRSA by PCR NEGATIVE NEGATIVE Final    Comment:        The GeneXpert MRSA Assay (FDA approved for NASAL specimens only), is one component of a comprehensive MRSA colonization surveillance program. It is not intended to diagnose MRSA infection nor to guide or monitor treatment for MRSA infections. Performed at [facility], [address] [place], [place] [address]   Culture, blood (Routine X 2) w Reflex to ID Panel     Status: None (Preliminary result)   Collection Time: 03/05/18  2:02 AM  Result Value Ref Range Status   Specimen Description BLOOD RIGHT HAND  Final   Special  Requests   Final    BOTTLES DRAWN AEROBIC ONLY Blood Culture results may not be optimal due to an inadequate volume of blood received in culture bottles   Culture   Final    NO GROWTH < 12 HOURS Performed at [facility], [address] [place], [place] [address]    Report Status PENDING  Incomplete  Culture, blood (Routine X 2) w Reflex to ID Panel     Status: None (Preliminary result)   Collection Time: 03/05/18  2:03 AM  Result Value Ref Range Status   Specimen Description BLOOD RIGHT HAND  Final   Special Requests   Final    BOTTLES DRAWN AEROBIC ONLY Blood Culture results may not be optimal due to an inadequate volume of blood received in culture bottles   Culture   Final    NO GROWTH < 12 HOURS Performed at [facility], [address], [place],  [address]    Report Status PENDING  Incomplete   Studies: Dg Abdomen 1 View  Result  Date: 03/04/2018 CLINICAL DATA:  OG tube advancement. EXAM: ABDOMEN - 1 VIEW COMPARISON:  CT scan March 11, 2017.  Chest x-ray March 04, 2018. FINDINGS: The side port of the OG tube is near the GE junction with the tip in the fundus. The patient may benefit from advancing the tube another 8 cm or so. Cholecystectomy clips. Rounded opacities project over the right upper quadrant, nonspecific. No other acute abnormalities. IMPRESSION: 1. The side port of the OG tube is probably just above the GE junction. The distal tip is within the stomach. Consider advancing up to 8 cm. 2. Rounded radiopaque densities project over the right upper quadrant and could be something on the patient. The patient is status post cholecystectomy which would exclude gallstones. No stones were seen in the right kidney on the previous CT scan. Recommend clinical correlation. Electronically Signed   By: Dorise Bullion III M.D   On: 03/04/2018 11:47   Dg Chest Port 1 View  Result Date: 03/05/2018 CLINICAL DATA:  Respiratory failure EXAM: PORTABLE CHEST 1 VIEW  COMPARISON:  03/04/2018 FINDINGS: Cardiac shadow is mildly enlarged but stable. Nasogastric catheter is noted within the stomach. Endotracheal tube is noted at the level of the carina. This should be withdrawn 2-3 cm. The lungs are clear. Vascular stent is noted on the left and stable. IMPRESSION: Endotracheal tube at the level of the carina. This should be withdrawn 2-3 cm. No other focal abnormality is noted. Electronically Signed   By: Inez Catalina M.D.   On: 03/05/2018 03:19   Dg Chest Port 1 View  Result Date: 03/04/2018 CLINICAL DATA:  Intubated. EXAM: PORTABLE CHEST 1 VIEW COMPARISON:  12/15/2017. FINDINGS: Interval endotracheal tube with its tip 2 cm above the carina. This could be retracted 2 cm to place it at the level of the clavicles. Interval nasogastric or orogastric tube with its tip at the gastroesophageal junction and side hole in the distal esophagus. Poor inspiration with stable borderline enlarged cardiac silhouette and clear lungs. Cholecystectomy clips. Left subclavian and axillary stent. Unremarkable bones. IMPRESSION: 1. Endotracheal tube tip 2 cm above the carina. This could be retracted 2 cm to place it at the level of the clavicles. 2. Nasogastric tube tip at the gastroesophageal junction and side hole in the distal esophagus. It is recommended that this be advanced. 3. Stable borderline cardiomegaly. Electronically Signed   By: Claudie Revering M.D.   On: 03/04/2018 11:44    Consults: Treatment Team:  Wilhelmina Mcardle, MD [name], MD   Subjective:    Overnight Issues: no significant changes overnight  Objective:  Vital signs for last 24 hours: Temp:  [94.5 F (34.7 C)-98.5 F (36.9 C)] 98.2 F (36.8 C) (08/16 0400) Pulse Rate:  [59-97] 70 (08/16 0700) Resp:  [6-23] 10 (08/16 0700) BP: (76-144)/(40-64) 108/46 (08/16 0700) SpO2:  [94 %-100 %] 100 % (08/16 0719) FiO2 (%):  [30 %-45 %] 30 % (08/16 0719) Weight:  [68 kg-69.9 kg] 69.9 kg (08/15 1245)  Hemodynamic  parameters for last 24 hours:    Intake/Output from previous day: 08/15 0701 - 08/16 0700 In: 2529.2 [I.V.:1729.2; IV Piggyback:800] Out: 530 [Urine:250; Emesis/NG output:280]  Intake/Output this shift: No intake/output data recorded.  Vent settings for last 24 hours: Vent Mode: PRVC FiO2 (%):  [30 %-45 %] 30 % Set Rate:  [12 bmp] 12 bmp Vt Set:  [400 mL-450 mL] 400 mL PEEP:  [5 cmH20] 5 cmH20 Plateau Pressure:  [10 IZT24-58 cmH20] 10 cmH20  Physical Exam:   General:  Acutely ill appearing female, laying in bed, intubated, in NAD Neuro:  Unresponsive, no sedation, Pupils PERRL 2 mm sluggish bilaterally HEENT:  Atraumatic, normocephalic, neck supple, ETT in place, No JVD Cardiovascular:  RRR, s1s2, 3/6 murmur Lungs:  Clear bilaterally, no wheezing, even, non-labored, vent assisted Abdomen:  Soft, non-tender, non-distended, BS+ x4 Musculoskeletal:  No deformities, no edema Skin:  Warm/dry. No obvious rashes, lesions, or ulcerations   Assessment/Plan:   72 y.o. Female found unresponsive 03/04/18 in setting of drug overdose requiring intubation for airway protection.  Unclear as to what meds she took, and whether this was intentional vs unintentional overdose.    Respiratory failure. Intubated for airway protection.We'll continue mechanical ventilation until mental status improves. Chest x-ray this morning reveals endotracheal tube pointing at the right mainstem bronchus, will pull back 3 cm  End-stage renal disease on hemodialysis Monday Wednesday Friday. Nephrology has been consulted. BUN 22/creatinine 4.26  Positive blood culture. Gram-positive cocci strep species. Contaminant versus true pathogen. Will empirically place on vancomycin, send pro calcitonin with follow-up blood cultures  Critical Care Total Time 40  Allycia Pitz 03/05/2018

## 2018-03-05 NOTE — Progress Notes (Signed)
[facility]  ROUNDING NOTE   Subjective:     Patient remains critically ill, intubated.  No sedation Appears to have some spontaneous movements Not responding to voice commands Patient's sister is at bedside  Objective:  Vital signs in last 24 hours:  Temp:  [94.5 F (34.7 C)-100.9 F (38.3 C)] 99.8 F (37.7 C) (08/16 1245) Pulse Rate:  [59-83] 81 (08/16 1345) Resp:  [10-22] 11 (08/16 1345) BP: (80-133)/(38-73) 130/54 (08/16 1345) SpO2:  [95 %-100 %] 98 % (08/16 1345) FiO2 (%):  [30 %-40 %] 30 % (08/16 1200)  Weight change:  Filed Weights   03/04/18 1054 03/04/18 1245  Weight: 68 kg 69.9 kg    Intake/Output: I/O last 3 completed shifts: In: 2529.2 [I.V.:1729.2; IV QIWLNLGXQ:119] Out: 530 [Urine:250; Emesis/NG output:280]   Intake/Output this shift:  Total I/O In: 600 [I.V.:400; IV Piggyback:200] Out: 85 [Urine:85]  Physical Exam: General: Critically ill  Head:  ET tube in place  Eyes: Anicteric,  Neck: Supple,   Lungs:  Ventilator assisted,   Heart: Regular rate and rhythm  Abdomen:  Soft, nontender,   Extremities:  no peripheral edema.  Neurologic:  not responding to verbal commands.  Some spontaneous movements in arms and legs  Skin: No lesions  Access: Left AVF    Basic Metabolic Panel: Recent Labs  Lab 03/02/18 1013 03/04/18 1053 03/04/18 1341 03/05/18 0203  NA 134* 135  --  136  K 3.8 4.5  --  4.8  CL 93* 100  --  103  CO2 32 27  --  24  GLUCOSE 93 127*  --  122*  BUN 30* 18  --  22  CREATININE 4.62* 3.85*  --  4.26*  CALCIUM 9.0 8.4*  --  8.7*  MG  --   --  2.0 1.9  PHOS 4.0  --  3.5 5.2*    Liver Function Tests: Recent Labs  Lab 03/02/18 1013 03/04/18 1053  AST  --  21  ALT  --  15  ALKPHOS  --  66  BILITOT  --  0.7  PROT  --  5.7*  ALBUMIN 3.8 3.4*   No results for input(s): LIPASE, AMYLASE in the last 168 hours. No results for input(s): AMMONIA in the last 168 hours.  CBC: Recent Labs  Lab 03/02/18 1013  03/04/18 1053 03/05/18 0203  WBC 7.4 6.7 9.7  HGB 10.9* 9.8* 10.7*  HCT 30.8* 27.2* 31.2*  MCV 96.6 95.6 98.3  PLT 133* 142* 158    Cardiac Enzymes: No results for input(s): CKTOTAL, CKMB, CKMBINDEX, TROPONINI in the last 168 hours.  BNP: Invalid input(s): POCBNP  CBG: Recent Labs  Lab 03/04/18 1103 03/04/18 1237 03/05/18 1155  GLUCAP 132* 125* 132*    Microbiology: Results for orders placed or performed during the hospital encounter of 03/04/18  Blood Cultures (routine x 2)     Status: None (Preliminary result)   Collection Time: 03/04/18 11:18 AM  Result Value Ref Range Status   Specimen Description   Final    BLOOD RIGHT ARM Performed at [facility], [place] [address] [place], [place] [address]    Special Requests   Final    BOTTLES DRAWN AEROBIC AND ANAEROBIC Blood Culture adequate volume Performed at [place] [place], [place] [address]    Culture  Setup Time   Final    GRAM POSITIVE COCCI AEROBIC BOTTLE ONLY CRITICAL RESULT CALLED TO, READ BACK BY AND VERIFIED WITH: DAVID  BESANTI ON 03/05/18 AT 0109 JAG Performed at [facility], [place] [address], [place], [place] [address]    Culture GRAM POSITIVE COCCI  Final   Report Status PENDING  Incomplete  Blood Culture ID Panel (Reflexed)     Status: Abnormal   Collection Time: 03/04/18 11:18 AM  Result Value Ref Range Status   Enterococcus species NOT DETECTED NOT DETECTED Final   Listeria monocytogenes NOT DETECTED NOT DETECTED Final   Staphylococcus species NOT DETECTED NOT DETECTED Final   Staphylococcus aureus NOT DETECTED NOT DETECTED Final   Streptococcus species DETECTED (A) NOT DETECTED Final    Comment: Not Enterococcus species, Streptococcus agalactiae, Streptococcus pyogenes, or Streptococcus pneumoniae. CRITICAL RESULT CALLED TO, READ BACK BY AND VERIFIED WITH: DAVID BESANTI ON 03/05/18 AT 0109 BY JAG    Streptococcus agalactiae NOT DETECTED NOT DETECTED Final    Streptococcus pneumoniae NOT DETECTED NOT DETECTED Final   Streptococcus pyogenes NOT DETECTED NOT DETECTED Final   Acinetobacter baumannii NOT DETECTED NOT DETECTED Final   Enterobacteriaceae species NOT DETECTED NOT DETECTED Final   Enterobacter cloacae complex NOT DETECTED NOT DETECTED Final   Escherichia coli NOT DETECTED NOT DETECTED Final   Klebsiella oxytoca NOT DETECTED NOT DETECTED Final   Klebsiella pneumoniae NOT DETECTED NOT DETECTED Final   Proteus species NOT DETECTED NOT DETECTED Final   Serratia marcescens NOT DETECTED NOT DETECTED Final   Haemophilus influenzae NOT DETECTED NOT DETECTED Final   Neisseria meningitidis NOT DETECTED NOT DETECTED Final   Pseudomonas aeruginosa NOT DETECTED NOT DETECTED Final   Candida albicans NOT DETECTED NOT DETECTED Final   Candida glabrata NOT DETECTED NOT DETECTED Final   Candida krusei NOT DETECTED NOT DETECTED Final   Candida parapsilosis NOT DETECTED NOT DETECTED Final   Candida tropicalis NOT DETECTED NOT DETECTED Final    Comment: Performed at [facility], [address] [place], [place] [address]  MRSA PCR Screening     Status: None   Collection Time: 03/04/18 12:37 PM  Result Value Ref Range Status   MRSA by PCR NEGATIVE NEGATIVE Final    Comment:        The GeneXpert MRSA Assay (FDA approved for NASAL specimens only), is one component of a comprehensive MRSA colonization surveillance program. It is not intended to diagnose MRSA infection nor to guide or monitor treatment for MRSA infections. Performed at [facility], [address] [place], [place] [address]   Culture, blood (Routine X 2) w Reflex to ID Panel     Status: None (Preliminary result)   Collection Time: 03/05/18  2:02 AM  Result Value Ref Range Status   Specimen Description BLOOD RIGHT HAND  Final   Special Requests   Final    BOTTLES DRAWN AEROBIC ONLY Blood Culture results may not be optimal due to an inadequate volume of blood  received in culture bottles   Culture   Final    NO GROWTH < 12 HOURS Performed at [facility], [address] [place], [place] [address]    Report Status PENDING  Incomplete  Culture, blood (Routine X 2) w Reflex to ID Panel     Status: None (Preliminary result)   Collection Time: 03/05/18  2:03 AM  Result Value Ref Range Status   Specimen Description BLOOD RIGHT HAND  Final   Special Requests   Final    BOTTLES DRAWN AEROBIC ONLY Blood Culture results may not be optimal due to an inadequate volume of blood received in  culture bottles   Culture   Final    NO GROWTH < 12 HOURS Performed at [facility], [address] [place], [place] [address]    Report Status PENDING  Incomplete    Coagulation Studies: No results for input(s): LABPROT, INR in the last 72 hours.  Urinalysis: Recent Labs    03/04/18 1102  COLORURINE YELLOW*  LABSPEC 1.008  PHURINE 7.0  GLUCOSEU NEGATIVE  HGBUR NEGATIVE  BILIRUBINUR NEGATIVE  KETONESUR NEGATIVE  PROTEINUR NEGATIVE  NITRITE NEGATIVE  LEUKOCYTESUR NEGATIVE      Imaging: Dg Abdomen 1 View  Result Date: 03/04/2018 CLINICAL DATA:  OG tube advancement. EXAM: ABDOMEN - 1 VIEW COMPARISON:  CT scan March 11, 2017.  Chest x-ray March 04, 2018. FINDINGS: The side port of the OG tube is near the GE junction with the tip in the fundus. The patient may benefit from advancing the tube another 8 cm or so. Cholecystectomy clips. Rounded opacities project over the right upper quadrant, nonspecific. No other acute abnormalities. IMPRESSION: 1. The side port of the OG tube is probably just above the GE junction. The distal tip is within the stomach. Consider advancing up to 8 cm. 2. Rounded radiopaque densities project over the right upper quadrant and could be something on the patient. The patient is status post cholecystectomy which would exclude gallstones. No stones were seen in the right kidney on the previous CT scan. Recommend  clinical correlation. Electronically Signed   By: Dorise Bullion III M.D   On: 03/04/2018 11:47   Ct Head Wo Contrast  Result Date: 03/05/2018 CLINICAL DATA:  Patient found down yesterday. Possible drug overdose. EXAM: CT HEAD WITHOUT CONTRAST TECHNIQUE: Contiguous axial images were obtained from the base of the skull through the vertex without intravenous contrast. COMPARISON:  Head CT scan 09/22/2017.  Brain MRI 09/02/2017. FINDINGS: Brain: No evidence of acute infarction, hemorrhage, hydrocephalus, extra-axial collection or mass lesion/mass effect. Cortical atrophy is noted. Vascular: No hyperdense vessel or unexpected calcification. Skull: Intact.  No focal lesion. Sinuses/Orbits: No acute or focal abnormality. Status post lens extraction. Other: None. IMPRESSION: No acute abnormality. Cortical atrophy. Electronically Signed   By: Inge Rise M.D.   On: 03/05/2018 11:22   Dg Chest Port 1 View  Result Date: 03/05/2018 CLINICAL DATA:  Respiratory failure EXAM: PORTABLE CHEST 1 VIEW COMPARISON:  03/04/2018 FINDINGS: Cardiac shadow is mildly enlarged but stable. Nasogastric catheter is noted within the stomach. Endotracheal tube is noted at the level of the carina. This should be withdrawn 2-3 cm. The lungs are clear. Vascular stent is noted on the left and stable. IMPRESSION: Endotracheal tube at the level of the carina. This should be withdrawn 2-3 cm. No other focal abnormality is noted. Electronically Signed   By: Inez Catalina M.D.   On: 03/05/2018 03:19   Dg Chest Port 1 View  Result Date: 03/04/2018 CLINICAL DATA:  Intubated. EXAM: PORTABLE CHEST 1 VIEW COMPARISON:  12/15/2017. FINDINGS: Interval endotracheal tube with its tip 2 cm above the carina. This could be retracted 2 cm to place it at the level of the clavicles. Interval nasogastric or orogastric tube with its tip at the gastroesophageal junction and side hole in the distal esophagus. Poor inspiration with stable borderline enlarged  cardiac silhouette and clear lungs. Cholecystectomy clips. Left subclavian and axillary stent. Unremarkable bones. IMPRESSION: 1. Endotracheal tube tip 2 cm above the carina. This could be retracted 2 cm to place it at the level of the clavicles. 2.  Nasogastric tube tip at the gastroesophageal junction and side hole in the distal esophagus. It is recommended that this be advanced. 3. Stable borderline cardiomegaly. Electronically Signed   By: Claudie Revering M.D.   On: 03/04/2018 11:44     Medications:   . sodium chloride 50 mL/hr at 03/05/18 1200  . cefTRIAXone (ROCEPHIN)  IV Stopped (03/05/18 1035)  . famotidine (PEPCID) IV Stopped (03/05/18 1210)  . feeding supplement (VITAL 1.5 CAL) 1,000 mL (03/05/18 1225)  . phenylephrine (NEO-SYNEPHRINE) Adult infusion Stopped (03/04/18 1805)   . chlorhexidine gluconate (MEDLINE KIT)  15 mL Mouth Rinse BID  . [START ON 03/06/2018] Chlorhexidine Gluconate Cloth  6 each Topical Q0600  . heparin  5,000 Units Subcutaneous Q8H  . mouth rinse  15 mL Mouth Rinse 10 times per day  . midodrine  5 mg Oral BID WC   acetaminophen **OR** acetaminophen, fentaNYL (SUBLIMAZE) injection, fentaNYL (SUBLIMAZE) injection, ipratropium-albuterol, midazolam, midazolam, ondansetron **OR** ondansetron (ZOFRAN) IV  Assessment/ Plan:  [name] is a 72 y.o. white female with end stage renal disease on hemodialysis, hypertension, bipolar disorder, GERD, polycystic kidney disease  [facility] MWF left AVF  1. End Stage Renal Disease  Patient is hemodynamically stable.  Plan for hemodialysis today  2.  Acute Resp failure - Vent assisted at present  3. Anemia of chronic kidney disease: hemoglobin 10.7 - EPO with HD  4.  Altered mental status Causes unclear Having some spontaneous movements today. Supportive care       LOS: Eva 8/16/20191:54 PM

## 2018-03-05 NOTE — Progress Notes (Signed)
Pharmacy Antibiotic Note  Joyce [name] is a 72 y.o. female admitted on 03/04/2018 with a chief complaint of unresponsiveness s/t drug overdose. Blood culture showed bacteremia and pharmacy has been consulted for ceftriaxone dosing.  Plan: Based on patient's lab results, patient qualifies for broad spectrum coverage such as ceftriaxone.   Ceftriaxone 2g IV daily.  Will continue to monitor blood cultures, 1/2 showing streptococcus. Repeat blood cultures on 8/16 no growth to date.     Height: 5\' 3"  (160 cm) Weight: 154 lb 1.6 oz (69.9 kg) IBW/kg (Calculated) : 52.4  Temp (24hrs), Avg:97.3 F (36.3 C), Min:94.5 F (34.7 C), Max:100.9 F (38.3 C)  Recent Labs  Lab 03/02/18 1013 03/04/18 1053 03/05/18 0203  WBC 7.4 6.7 9.7  CREATININE 4.62* 3.85* 4.26*  LATICACIDVEN  --  1.9  --     Estimated Creatinine Clearance: 11.4 mL/min (A) (by C-G formula based on SCr of 4.26 mg/dL (H)).    Allergies  Allergen Reactions  . Morphine And Related Shortness Of Breath    Pt reports chest pain and difficulty breathing.  . Indomethacin Hives  . Pollen Extract Other (See Comments)    Sinus problems and HA    Antimicrobials this admission: Ceftriaxone 08/16 >>  Microbiology results: 8/15 BCx: gram positive cocci (1/2 - streptococcus on BCID) 8/16 BCx: no growth < 12 hours  8/15 MRSA PCR: negative  Thank you for allowing pharmacy to be a part of this patient's care.  Janey Greaser Quincey Nored, PharmD Candidate 03/05/2018 11:31 AM

## 2018-03-05 NOTE — Progress Notes (Signed)
Initial Nutrition Assessment  DOCUMENTATION CODES:   Not applicable  INTERVENTION:  If unable to extubate within 24-48 hours, recommend  Vital 1.5 begin at 66mL/hr, increase by 10 every 6 hours to goal rate of 38mL/hr, Pro-stat 61mL BID  Provides 1640 calories, 95 grams of protein, and 718mL free water  NUTRITION DIAGNOSIS:   Inadequate oral intake related to inability to eat as evidenced by NPO status.  GOAL:   Provide needs based on ASPEN/SCCM guidelines  MONITOR:   I & O's, Weight trends  REASON FOR ASSESSMENT:   Ventilator    ASSESSMENT:   Joyce [name] has a PMH CKD on HD MWF, who was found with pills scattered around her presents with drug overdose requiring intubation for airway protection.  Patient with no family available, in CT during time of visit.  Patient is currently intubated on ventilator support MV: 6.9 L/min Temp (24hrs), Avg:97.3 F (36.3 C), Min:94.5 F (34.7 C), Max:100.9 F (38.3 C)  Propofol: none  Labs reviewed: PO4- 5.2  Medications reviewed and include:  NS at 58mL/hr  MAP: 64-72   Intake/Output Summary (Last 24 hours) at 03/05/2018 1137 Last data filed at 03/05/2018 1100 Gross per 24 hour  Intake 2979.17 ml  Output 530 ml  Net 2449.17 ml  2.5L Fluid Positive 273mL UOP last 24 hrs   NUTRITION - FOCUSED PHYSICAL EXAM:    Most Recent Value  Orbital Region  Unable to assess  Upper Arm Region  Unable to assess  Thoracic and Lumbar Region  Unable to assess  Buccal Region  Unable to assess  [place] Region  Unable to assess  Clavicle Bone Region  Unable to assess  Clavicle and Acromion Bone Region  Unable to assess  Scapular Bone Region  Unable to assess  Dorsal Hand  Unable to assess  Patellar Region  Unable to assess  Anterior Thigh Region  Unable to assess  Posterior Calf Region  Unable to assess  Edema (RD Assessment)  Unable to assess  Hair  Unable to assess  Eyes  Unable to assess  Mouth  Unable to assess  Skin  Unable  to assess  Nails  Unable to assess       Diet Order:   Diet Order            Diet NPO time specified  Diet effective now              EDUCATION NEEDS:   Not appropriate for education at this time  Skin:  Skin Assessment: Reviewed RN Assessment  Last BM:  PTA  Height:   Ht Readings from Last 1 Encounters:  03/04/18 5\' 3"  (1.6 m)    Weight:   Wt Readings from Last 1 Encounters:  03/04/18 69.9 kg    Ideal Body Weight:  52.27 kg  BMI:  Body mass index is 27.3 kg/m.  Estimated Nutritional Needs:   Kcal:  1538 calories (PSU 2003b w/ MSJ 1187, Ve 6.9, Tmax 38.3)  Protein:  84-105 grams (1.2-1.5g/kg)  Fluid:  UOP +1L    Satira Anis. Debarah Mccumbers, MS, RD LDN Inpatient Clinical Dietitian Pager [phone]

## 2018-03-05 NOTE — Progress Notes (Signed)
Attempted to call and reach pt's son [name] at the phone number listed in chart [phone]] to obtain consent to hemodialysis.  Unable to reach patient's son despite numerous phone calls.  Called and obtained consent from Sherri Rad, patient's sister.

## 2018-03-05 NOTE — Progress Notes (Signed)
Pre HD Assessment, pt intubated not interactive, stable for tx.    03/05/18 1215  Neurological  Level of Consciousness Responds to Pain  Orientation Level Intubated/Tracheostomy - Unable to assess  Respiratory  Respiratory Pattern Regular  Chest Assessment Chest expansion symmetrical  Bilateral Breath Sounds Clear;Diminished  Cough None  Cardiac  Pulse Regular  Heart Sounds S1, S2  ECG Monitor Yes  Psychosocial  Psychosocial (WDL) X  Patient Behaviors Not interactive

## 2018-03-05 NOTE — Progress Notes (Signed)
CRITICAL VALUE ALERT  Critical Value:  Troponin 0.05  Date & Time Notied:  03/05/18 1850  Provider Notified: Dr. Candiss Norse, ordering physician  Orders Received/Actions taken: No new orders

## 2018-03-05 NOTE — Progress Notes (Signed)
HD Tx started w/o complicati  16/10/96 0454  Vital Signs  Temp 99.8 F (37.7 C)  Temp Source Axillary  Pulse Rate 74  Pulse Rate Source Monitor  Resp 12  BP (!) 112/43  BP Location Right Arm  BP Method Automatic  Oxygen Therapy  SpO2 98 %  O2 Device Ventilator  End Tidal CO2 (EtCO2) 35  Pain Assessment  Pain Scale CPOT  Critical Care Pain Observation Tool (CPOT)  Facial Expression 0  Body Movements 0  Muscle Tension 0  Compliance with ventilator (intubated pts.) 0  Vocalization (extubated pts.) N/A  CPOT Total 0  Time-Out for Hemodialysis  What Procedure? HD   Pt Identifiers(min of two) First/Last Name;MRN/Account#  Correct Site? Yes  Correct Side? Yes  Correct Procedure? Yes  Consents Verified? Yes  Rad Studies Available? N/A  Safety Precautions Reviewed? Yes  [employer] Number [id number]  Station Number  (ICU-Bedside f)  UF/Alarm Test Passed  Conductivity: Meter 14  Conductivity: Machine  14.1  pH 7.4  Reverse Osmosis Main  Normal Saline Lot Number [id number]  Dialyzer Lot Number [id number]  Disposable Set Lot Number [id number]  Machine Temperature 98.6 F (37 C)  Musician and Audible Yes  Blood Lines Intact and Secured Yes  Pre Treatment Patient Checks  Vascular access used during treatment Fistula  Hepatitis B Surface Antigen Results Negative  Date Hepatitis B Surface Antigen Drawn 01/06/18  Hepatitis B Surface Antibody 17  Date Hepatitis B Surface Antibody Drawn 02/24/18  Hemodialysis Standing Orders Initiated Yes  ECG (Telemetry) Monitor On Yes  Prime Ordered Normal Saline  Length of  DialysisTreatment -hour(s) 3.5 Hour(s)  Dialysis Treatment Comments Na 140  Dialyzer Elisio 17H NR  Dialysate 3K, 2.5 Ca  Dialysis Anticoagulant None  Dialysate Flow Ordered 800  Blood Flow Rate Ordered 400 mL/min  Ultrafiltration Goal 0 Liters  Dialysis Blood Pressure Support Ordered Normal Saline  During Hemodialysis Assessment  Blood Flow Rate  (mL/min) 400 mL/min  Arterial Pressure (mmHg) -160 mmHg  Venous Pressure (mmHg) 190 mmHg  Transmembrane Pressure (mmHg) 60 mmHg  Ultrafiltration Rate (mL/min) 0 mL/min  Dialysate Flow Rate (mL/min) 800 ml/min  Conductivity: Machine  14.1  HD Safety Checks Performed Yes  Dialysis Fluid Bolus Normal Saline  Bolus Amount (mL) 250 mL  Intra-Hemodialysis Comments Tx initiated  Education / Care Plan  Dialysis Education Provided No (Comment)  Documented Education in Care Plan  (pt non interactive )  Fistula / Graft Left Upper arm Arteriovenous fistula  Placement Date: 08/13/15   Placed prior to admission: Yes  Orientation: Left  Access Location: Upper arm  Access Type: Arteriovenous fistula  Site Condition No complications  Fistula / Graft Assessment Present;Thrill;Bruit  Status Accessed  Needle Size 15 g  Drainage Description None  on, once consent from pt son obtained.

## 2018-03-05 NOTE — Progress Notes (Signed)
PHARMACY - PHYSICIAN COMMUNICATION CRITICAL VALUE ALERT - BLOOD CULTURE IDENTIFICATION (BCID)  Joyce [name] is an 72 y.o. female who presented to [facility] on 03/04/2018 with a chief complaint of unresponsiveness s/t drug overdose  Assessment:  Tmin 94.5 afebrile now, HR 60's, BP WNL, WBC WNL. CXR cardiomegaly, RUQ opacity (was felt to be an object on the patient), 1/2 GPC BCID Streptococcus species.  Name of physician (or Provider) Contacted: Harless Nakayama  Current antibiotics: None  Changes to prescribed antibiotics recommended:  This culture was felt to be a contaminant, since only one set of blood cultures were drawn, NP will draw another set of blood cultures and reasses  Results for orders placed or performed during the hospital encounter of 03/04/18  Blood Culture ID Panel (Reflexed) (Collected: 03/04/2018 11:18 AM)  Result Value Ref Range   Enterococcus species NOT DETECTED NOT DETECTED   Listeria monocytogenes NOT DETECTED NOT DETECTED   Staphylococcus species NOT DETECTED NOT DETECTED   Staphylococcus aureus NOT DETECTED NOT DETECTED   Streptococcus species DETECTED (A) NOT DETECTED   Streptococcus agalactiae NOT DETECTED NOT DETECTED   Streptococcus pneumoniae NOT DETECTED NOT DETECTED   Streptococcus pyogenes NOT DETECTED NOT DETECTED   Acinetobacter baumannii NOT DETECTED NOT DETECTED   Enterobacteriaceae species NOT DETECTED NOT DETECTED   Enterobacter cloacae complex NOT DETECTED NOT DETECTED   Escherichia coli NOT DETECTED NOT DETECTED   Klebsiella oxytoca NOT DETECTED NOT DETECTED   Klebsiella pneumoniae NOT DETECTED NOT DETECTED   Proteus species NOT DETECTED NOT DETECTED   Serratia marcescens NOT DETECTED NOT DETECTED   Haemophilus influenzae NOT DETECTED NOT DETECTED   Neisseria meningitidis NOT DETECTED NOT DETECTED   Pseudomonas aeruginosa NOT DETECTED NOT DETECTED   Candida albicans NOT DETECTED NOT DETECTED   Candida glabrata NOT DETECTED NOT  DETECTED   Candida krusei NOT DETECTED NOT DETECTED   Candida parapsilosis NOT DETECTED NOT DETECTED   Candida tropicalis NOT DETECTED NOT DETECTED   Tobie Lords, PharmD, BCPS Clinical Pharmacist 03/05/2018

## 2018-03-05 NOTE — Care Management (Addendum)
Recent discharge from psych 8/14 after admission for bipolar sx.Marland Kitchen She was discharged to her own home with family being reluctant to support this plan. Found unresponsive in her home by family. Admitted for drug overdose and intubated to protect airway. Notified Elvera Bicker with [employer]

## 2018-03-05 NOTE — Progress Notes (Signed)
LCSW met with patients family ( sister) who reports her sister was not suicidal and that she was in good spirits and excited to be home. She had actively participated in her dialysis treatment.  This worker provided emotional support to sister who was present with patients pastor. LCSW provided phone number and will be available to family Over this weekend  LCSW consulted with Monica ( Team Leader) and expressed I would follow patient and family needs over this weekend. MM agreed that would be fine.    LCSW [phone] 

## 2018-03-06 ENCOUNTER — Inpatient Hospital Stay: Payer: Medicare HMO

## 2018-03-06 DIAGNOSIS — G9340 Encephalopathy, unspecified: Secondary | ICD-10-CM

## 2018-03-06 LAB — BASIC METABOLIC PANEL
Anion gap: 9 (ref 5–15)
BUN: 24 mg/dL — ABNORMAL HIGH (ref 8–23)
CHLORIDE: 104 mmol/L (ref 98–111)
CO2: 28 mmol/L (ref 22–32)
CREATININE: 3.47 mg/dL — AB (ref 0.44–1.00)
Calcium: 9.1 mg/dL (ref 8.9–10.3)
GFR, EST AFRICAN AMERICAN: 14 mL/min — AB (ref >60)
GFR, EST NON AFRICAN AMERICAN: 12 mL/min — AB (ref >60)
Glucose, Bld: 141 mg/dL — ABNORMAL HIGH (ref 70–99)
Potassium: 3.9 mmol/L (ref 3.5–5.1)
SODIUM: 141 mmol/L (ref 135–145)

## 2018-03-06 LAB — URINE CULTURE: Culture: NO GROWTH

## 2018-03-06 LAB — GLUCOSE, CAPILLARY
GLUCOSE-CAPILLARY: 124 mg/dL — AB (ref 70–99)
Glucose-Capillary: 114 mg/dL — ABNORMAL HIGH (ref 70–99)
Glucose-Capillary: 114 mg/dL — ABNORMAL HIGH (ref 70–99)
Glucose-Capillary: 120 mg/dL — ABNORMAL HIGH (ref 70–99)
Glucose-Capillary: 121 mg/dL — ABNORMAL HIGH (ref 70–99)
Glucose-Capillary: 128 mg/dL — ABNORMAL HIGH (ref 70–99)

## 2018-03-06 MED ORDER — FAMOTIDINE IN NACL 20-0.9 MG/50ML-% IV SOLN
20.0000 mg | INTRAVENOUS | Status: DC
  Administered 2018-03-07 – 2018-03-09 (×3): 20 mg via INTRAVENOUS
  Filled 2018-03-06 (×3): qty 50

## 2018-03-06 NOTE — Progress Notes (Signed)
Patient off floor to MRI

## 2018-03-06 NOTE — Progress Notes (Signed)
Pt  Transported to MRI AND BACK ROOM WITHOUT ANY INCIDENT

## 2018-03-06 NOTE — Progress Notes (Signed)
Follow up - Critical Care Medicine Note  Patient Details:    Joyce [name] is an 72 y.o. female. with a past medical history Remarkable for bipolar disorder, chronic renal failure, on hemodialysis, Monday once a Friday,, who presented to [facility] ED on 03/04/18 after being found unresponsive by her mother. The patient was found with pills scattered around her in the am 8/15.  Family had last talked to the patient at 1700 on 03/03/18, of which she seemed in normal spirits.   At this time, it is unclear what pills she ingested, and whether this was an intentional or unintentional overdose. It is noted that she was discharged from [facility] ward yesterday 03/03/18.  Review of home medications indicates she takes Carbamazepine, Haloperidol, Temazepam, and Ropinirole  In the ED, she was intubated for airway protection.  Lines, Airways, Drains: Airway (Active)     Airway (Active)     Airway (Active)     Airway 7.5 mm (Active)  Secured at (cm) 24 cm 03/05/2018  7:19 AM  Measured From Lips 03/05/2018  7:19 AM  Secured Location Left 03/05/2018  7:19 AM  Secured By [employer] 03/05/2018  7:19 AM  Tube Holder Repositioned Yes 03/05/2018  4:10 AM  Cuff Pressure (cm H2O) 25 cm H2O 03/04/2018  7:25 PM  Site Condition Dry 03/05/2018  7:19 AM     NG/OG Tube Orogastric 14 Fr. Center mouth Aucultation Measured external length of tube (Active)  External Length of Tube (cm) - (if applicable) 55 cm 4/69/6295  4:00 PM  Site Assessment Clean;Dry 03/05/2018  4:10 AM  Ongoing Placement Verification No acute changes, not attributed to clinical condition;No change in respiratory status 03/04/2018  4:00 PM  Status Clamped 03/04/2018  7:40 PM  Amount of suction 100 mmHg 03/04/2018  4:00 PM  Drainage Appearance Green;Clear 03/04/2018  4:00 PM  Output (mL) 30 mL 03/05/2018  5:00 AM     Urethral Catheter Cassie, RN 14 Fr. (Active)  Indication for Insertion or Continuance of Catheter Unstable critical patients  (first 24-48 hours) 03/05/2018  4:10 AM  Site Assessment Clean;Intact 03/05/2018  4:10 AM  Catheter Maintenance Bag below level of bladder;Drainage bag/tubing not touching floor;Catheter secured;No dependent loops;Seal intact 03/05/2018  4:10 AM  Collection Container Standard drainage bag 03/05/2018  4:10 AM  Securement Method Securing device (Describe) 03/05/2018  4:10 AM  Urinary Catheter Interventions Unclamped 03/05/2018  4:10 AM  Output (mL) 175 mL 03/04/2018  6:05 PM    Anti-infectives:  Anti-infectives (From admission, onward)   Start     Dose/Rate Route Frequency Ordered Stop   03/05/18 0900  cefTRIAXone (ROCEPHIN) 2 g in sodium chloride 0.9 % 100 mL IVPB     2 g 200 mL/hr over 30 Minutes Intravenous Daily 03/05/18 0849     03/05/18 0830  vancomycin (VANCOCIN) IVPB 1000 mg/200 mL premix  Status:  Discontinued     1,000 mg 200 mL/hr over 60 Minutes Intravenous  Once 03/05/18 2841 03/05/18 0849      Microbiology: Results for orders placed or performed during the hospital encounter of 03/04/18  Blood Cultures (routine x 2)     Status: None (Preliminary result)   Collection Time: 03/04/18 11:18 AM  Result Value Ref Range Status   Specimen Description   Final    BLOOD RIGHT ARM Performed at [facility], [address] [place], [place] [address]    Special Requests   Final    BOTTLES DRAWN AEROBIC AND ANAEROBIC  Blood Culture adequate volume Performed at [facility], [address] [place], [place] [address]    Culture  Setup Time   Final    GRAM POSITIVE COCCI AEROBIC BOTTLE ONLY CRITICAL RESULT CALLED TO, READ BACK BY AND VERIFIED WITH: DAVID BESANTI ON 03/05/18 AT 0109 JAG Performed at [facility], [place] [address], [place], [place] [address]    Culture GRAM POSITIVE COCCI  Final   Report Status PENDING  Incomplete  Blood Culture ID Panel (Reflexed)     Status: Abnormal   Collection Time: 03/04/18 11:18 AM  Result Value Ref Range Status    Enterococcus species NOT DETECTED NOT DETECTED Final   Listeria monocytogenes NOT DETECTED NOT DETECTED Final   Staphylococcus species NOT DETECTED NOT DETECTED Final   Staphylococcus aureus NOT DETECTED NOT DETECTED Final   Streptococcus species DETECTED (A) NOT DETECTED Final    Comment: Not Enterococcus species, Streptococcus agalactiae, Streptococcus pyogenes, or Streptococcus pneumoniae. CRITICAL RESULT CALLED TO, READ BACK BY AND VERIFIED WITH: DAVID BESANTI ON 03/05/18 AT 0109 BY JAG    Streptococcus agalactiae NOT DETECTED NOT DETECTED Final   Streptococcus pneumoniae NOT DETECTED NOT DETECTED Final   Streptococcus pyogenes NOT DETECTED NOT DETECTED Final   Acinetobacter baumannii NOT DETECTED NOT DETECTED Final   Enterobacteriaceae species NOT DETECTED NOT DETECTED Final   Enterobacter cloacae complex NOT DETECTED NOT DETECTED Final   Escherichia coli NOT DETECTED NOT DETECTED Final   Klebsiella oxytoca NOT DETECTED NOT DETECTED Final   Klebsiella pneumoniae NOT DETECTED NOT DETECTED Final   Proteus species NOT DETECTED NOT DETECTED Final   Serratia marcescens NOT DETECTED NOT DETECTED Final   Haemophilus influenzae NOT DETECTED NOT DETECTED Final   Neisseria meningitidis NOT DETECTED NOT DETECTED Final   Pseudomonas aeruginosa NOT DETECTED NOT DETECTED Final   Candida albicans NOT DETECTED NOT DETECTED Final   Candida glabrata NOT DETECTED NOT DETECTED Final   Candida krusei NOT DETECTED NOT DETECTED Final   Candida parapsilosis NOT DETECTED NOT DETECTED Final   Candida tropicalis NOT DETECTED NOT DETECTED Final    Comment: Performed at [facility], [address] [place], [place] [address]  MRSA PCR Screening     Status: None   Collection Time: 03/04/18 12:37 PM  Result Value Ref Range Status   MRSA by PCR NEGATIVE NEGATIVE Final    Comment:        The GeneXpert MRSA Assay (FDA approved for NASAL specimens only), is one component of a comprehensive MRSA  colonization surveillance program. It is not intended to diagnose MRSA infection nor to guide or monitor treatment for MRSA infections. Performed at [facility], [place] [place], [place] [address]   Culture, blood (Routine X 2) w Reflex to ID Panel     Status: None (Preliminary result)   Collection Time: 03/05/18  2:02 AM  Result Value Ref Range Status   Specimen Description BLOOD RIGHT HAND  Final   Special Requests   Final    BOTTLES DRAWN AEROBIC ONLY Blood Culture results may not be optimal due to an inadequate volume of blood received in culture bottles   Culture   Final    NO GROWTH 1 DAY Performed at [facility], [address], [place], [place] [address]    Report Status PENDING  Incomplete  Culture, blood (Routine X 2) w Reflex to ID Panel     Status: None (Preliminary result)   Collection Time: 03/05/18  2:03 AM  Result Value Ref Range Status   Specimen Description BLOOD RIGHT HAND  Final   Special Requests   Final    BOTTLES DRAWN AEROBIC ONLY Blood Culture results may not be optimal due to an inadequate volume of blood received in culture bottles   Culture   Final    NO GROWTH 1 DAY Performed at [facility], [address] [place], [place] [address]    Report Status PENDING  Incomplete   Studies: Dg Abdomen 1 View  Result Date: 03/04/2018 CLINICAL DATA:  OG tube advancement. EXAM: ABDOMEN - 1 VIEW COMPARISON:  CT scan March 11, 2017.  Chest x-ray March 04, 2018. FINDINGS: The side port of the OG tube is near the GE junction with the tip in the fundus. The patient may benefit from advancing the tube another 8 cm or so. Cholecystectomy clips. Rounded opacities project over the right upper quadrant, nonspecific. No other acute abnormalities. IMPRESSION: 1. The side port of the OG tube is probably just above the GE junction. The distal tip is within the stomach. Consider advancing up to 8 cm. 2. Rounded radiopaque densities project  over the right upper quadrant and could be something on the patient. The patient is status post cholecystectomy which would exclude gallstones. No stones were seen in the right kidney on the previous CT scan. Recommend clinical correlation. Electronically Signed   By: Dorise Bullion III M.D   On: 03/04/2018 11:47   Ct Head Wo Contrast  Result Date: 03/05/2018 CLINICAL DATA:  Patient found down yesterday. Possible drug overdose. EXAM: CT HEAD WITHOUT CONTRAST TECHNIQUE: Contiguous axial images were obtained from the base of the skull through the vertex without intravenous contrast. COMPARISON:  Head CT scan 09/22/2017.  Brain MRI 09/02/2017. FINDINGS: Brain: No evidence of acute infarction, hemorrhage, hydrocephalus, extra-axial collection or mass lesion/mass effect. Cortical atrophy is noted. Vascular: No hyperdense vessel or unexpected calcification. Skull: Intact.  No focal lesion. Sinuses/Orbits: No acute or focal abnormality. Status post lens extraction. Other: None. IMPRESSION: No acute abnormality. Cortical atrophy. Electronically Signed   By: Inge Rise M.D.   On: 03/05/2018 11:22   Dg Chest Port 1 View  Result Date: 03/05/2018 CLINICAL DATA:  Respiratory failure EXAM: PORTABLE CHEST 1 VIEW COMPARISON:  03/04/2018 FINDINGS: Cardiac shadow is mildly enlarged but stable. Nasogastric catheter is noted within the stomach. Endotracheal tube is noted at the level of the carina. This should be withdrawn 2-3 cm. The lungs are clear. Vascular stent is noted on the left and stable. IMPRESSION: Endotracheal tube at the level of the carina. This should be withdrawn 2-3 cm. No other focal abnormality is noted. Electronically Signed   By: Inez Catalina M.D.   On: 03/05/2018 03:19   Dg Chest Port 1 View  Result Date: 03/04/2018 CLINICAL DATA:  Intubated. EXAM: PORTABLE CHEST 1 VIEW COMPARISON:  12/15/2017. FINDINGS: Interval endotracheal tube with its tip 2 cm above the carina. This could be retracted 2 cm  to place it at the level of the clavicles. Interval nasogastric or orogastric tube with its tip at the gastroesophageal junction and side hole in the distal esophagus. Poor inspiration with stable borderline enlarged cardiac silhouette and clear lungs. Cholecystectomy clips. Left subclavian and axillary stent. Unremarkable bones. IMPRESSION: 1. Endotracheal tube tip 2 cm above the carina. This could be retracted 2 cm to place it at the level of the clavicles. 2. Nasogastric tube tip at the gastroesophageal junction and side hole in the distal esophagus. It  is recommended that this be advanced. 3. Stable borderline cardiomegaly. Electronically Signed   By: Claudie Revering M.D.   On: 03/04/2018 11:44    Consults: Treatment Team:  Wilhelmina Mcardle, MD [name], MD Alexis Goodell, MD   Subjective:    Overnight Issues: no significant changes overnight. Postures to pain  Objective:  Vital signs for last 24 hours: Temp:  [97.8 F (36.6 C)-99.9 F (37.7 C)] 97.8 F (36.6 C) (08/17 0400) Pulse Rate:  [67-102] 94 (08/17 0800) Resp:  [10-22] 18 (08/17 0800) BP: (95-182)/(38-129) 182/55 (08/17 0800) SpO2:  [95 %-100 %] 99 % (08/17 0800) FiO2 (%):  [30 %] 30 % (08/17 0321) Weight:  [71.8 kg] 71.8 kg (08/17 0425)  Hemodynamic parameters for last 24 hours:    Intake/Output from previous day: 08/16 0701 - 08/17 0700 In: 955.8 [I.V.:700; NG/GT:55.8; IV Piggyback:200] Out: 460 [Urine:460]  Intake/Output this shift: No intake/output data recorded.  Vent settings for last 24 hours: Vent Mode: PRVC FiO2 (%):  [30 %] 30 % Set Rate:  [12 bmp] 12 bmp Vt Set:  [400 mL] 400 mL PEEP:  [5 cmH20] 5 cmH20 Plateau Pressure:  [8 cmH20-14 cmH20] 14 cmH20  Physical Exam:   General:  Acutely ill appearing female, laying in bed, intubated, in NAD Neuro:  Unresponsive, no sedation, Pupils PERRL 2 mm sluggish bilaterally HEENT:  Atraumatic, normocephalic, neck supple, ETT in place, No  JVD Cardiovascular:  RRR, s1s2, 3/6 murmur Lungs:  Clear bilaterally, no wheezing, even, non-labored, vent assisted Abdomen:  Soft, non-tender, non-distended, BS+ x4 Musculoskeletal:  No deformities, no edema Neurologic: Patient does posture with her right to pain, positive gag, positive breathing over the ventilator   Assessment/Plan:   72 y.o. Female found unresponsive 03/04/18 in setting of possible drug overdose requiring intubation for airway protection.  Unclear as to what meds she took, and whether this was intentional vs unintentional overdose.    Respiratory failure. Intubated for airway protection.We'll continue mechanical ventilation until mental status improves. Chest x-ray this morning pending.   End-stage renal disease on hemodialysis Monday Wednesday Friday. Nephrology has been consulted. BUN 22/creatinine 4.26  Positive blood culture. Gram-positive cocci strep species. On rocephin  Critical Care Total Time 35  Dorothy Polhemus 03/06/2018 Patient ID: Joyce [name], female   DOB: [birth year], 72 y.o.   MRN: [id number]

## 2018-03-06 NOTE — Progress Notes (Signed)
[facility] [place] NAME: Joyce [name]    MR#:  [id number]  DATE OF BIRTH:  04-Jan-1946  SUBJECTIVE:  CHIEF COMPLAINT:   Chief Complaint  Patient presents with  . Other    Unresponsive   -Patient with bacteremia, and unresponsive at home, intubated.  Recently discharged from psychiatry unit 03/03/18 -Not on sedation at this time.  Nonpurposeful movements occasionally noted  REVIEW OF SYSTEMS:  Review of Systems  Unable to perform ROS: Critical illness    DRUG ALLERGIES:   Allergies  Allergen Reactions  . Morphine And Related Shortness Of Breath    Pt reports chest pain and difficulty breathing.  . Indomethacin Hives  . Pollen Extract Other (See Comments)    Sinus problems and HA    VITALS:  Blood pressure (!) 160/61, pulse 85, temperature 98.8 F (37.1 C), temperature source Axillary, resp. rate 12, height 5\' 3"  (1.6 m), weight 71.8 kg, SpO2 99 %.  PHYSICAL EXAMINATION:  Physical Exam   GENERAL:  72 y.o.-year-old patient lying in the bed with no acute distress. Critically ill appearing EYES: Pupils equal, round, reactive to light and accommodation. No scleral icterus. Extraocular muscles intact.  HEENT: Head atraumatic, normocephalic. Oropharynx and nasopharynx clear.  NECK:  Supple, no jugular venous distention. No thyroid enlargement, no tenderness.  LUNGS: Normal breath sounds bilaterally, no wheezing, rales,rhonchi or crepitation. No use of accessory muscles of respiration.  CARDIOVASCULAR: S1, S2 normal. No murmurs, rubs, or gallops.  ABDOMEN: Soft, nontender, nondistended. Bowel sounds present. No organomegaly or mass.  EXTREMITIES: No pedal edema, cyanosis, or clubbing.  Left arm AV fistula NEUROLOGIC: no facial droop, some movements of the limbs noted.. Not following commands No posturing  PSYCHIATRIC: The patient is not responding  SKIN: No obvious rash, lesion, or ulcer.    LABORATORY PANEL:   CBC Recent Labs   Lab 03/05/18 0203  WBC 9.7  HGB 10.7*  HCT 31.2*  PLT 158   ------------------------------------------------------------------------------------------------------------------  Chemistries  Recent Labs  Lab 03/04/18 1053  03/05/18 0203 03/06/18 0815  NA 135  --  136 141  K 4.5  --  4.8 3.9  CL 100  --  103 104  CO2 27  --  24 28  GLUCOSE 127*  --  122* 141*  BUN 18  --  22 24*  CREATININE 3.85*  --  4.26* 3.47*  CALCIUM 8.4*  --  8.7* 9.1  MG  --    < > 1.9  --   AST 21  --   --   --   ALT 15  --   --   --   ALKPHOS 66  --   --   --   BILITOT 0.7  --   --   --    < > = values in this interval not displayed.   ------------------------------------------------------------------------------------------------------------------  Cardiac Enzymes Recent Labs  Lab 03/05/18 1817  TROPONINI 0.05*   ------------------------------------------------------------------------------------------------------------------  RADIOLOGY:  Dg Abdomen 1 View  Result Date: 03/04/2018 CLINICAL DATA:  OG tube advancement. EXAM: ABDOMEN - 1 VIEW COMPARISON:  CT scan March 11, 2017.  Chest x-ray March 04, 2018. FINDINGS: The side port of the OG tube is near the GE junction with the tip in the fundus. The patient may benefit from advancing the tube another 8 cm or so. Cholecystectomy clips. Rounded opacities project over the right upper quadrant, nonspecific. No other acute abnormalities. IMPRESSION: 1. The side port of  the OG tube is probably just above the GE junction. The distal tip is within the stomach. Consider advancing up to 8 cm. 2. Rounded radiopaque densities project over the right upper quadrant and could be something on the patient. The patient is status post cholecystectomy which would exclude gallstones. No stones were seen in the right kidney on the previous CT scan. Recommend clinical correlation. Electronically Signed   By: Dorise Bullion III M.D   On: 03/04/2018 11:47   Ct Head Wo  Contrast  Result Date: 03/05/2018 CLINICAL DATA:  Patient found down yesterday. Possible drug overdose. EXAM: CT HEAD WITHOUT CONTRAST TECHNIQUE: Contiguous axial images were obtained from the base of the skull through the vertex without intravenous contrast. COMPARISON:  Head CT scan 09/22/2017.  Brain MRI 09/02/2017. FINDINGS: Brain: No evidence of acute infarction, hemorrhage, hydrocephalus, extra-axial collection or mass lesion/mass effect. Cortical atrophy is noted. Vascular: No hyperdense vessel or unexpected calcification. Skull: Intact.  No focal lesion. Sinuses/Orbits: No acute or focal abnormality. Status post lens extraction. Other: None. IMPRESSION: No acute abnormality. Cortical atrophy. Electronically Signed   By: Inge Rise M.D.   On: 03/05/2018 11:22   Dg Chest Port 1 View  Result Date: 03/06/2018 CLINICAL DATA:  Ventilatory support, respiratory failure EXAM: PORTABLE CHEST 1 VIEW COMPARISON:  03/05/2018 FINDINGS: Endotracheal tube 2.9 cm above the carina. NG tube enters the stomach with the tip not visualized. Left axillary vascular stents again noted. Stable cardiomegaly without CHF or significant focal airspace process. No significant collapse or consolidation. No enlarging effusion or pneumothorax. Overall slight improvement in aeration. IMPRESSION: Stable support apparatus. Cardiomegaly without CHF Slight improved lung volumes. Electronically Signed   By: Jerilynn Mages.  Shick M.D.   On: 03/06/2018 09:35   Dg Chest Port 1 View  Result Date: 03/05/2018 CLINICAL DATA:  Respiratory failure EXAM: PORTABLE CHEST 1 VIEW COMPARISON:  03/04/2018 FINDINGS: Cardiac shadow is mildly enlarged but stable. Nasogastric catheter is noted within the stomach. Endotracheal tube is noted at the level of the carina. This should be withdrawn 2-3 cm. The lungs are clear. Vascular stent is noted on the left and stable. IMPRESSION: Endotracheal tube at the level of the carina. This should be withdrawn 2-3 cm. No  other focal abnormality is noted. Electronically Signed   By: Inez Catalina M.D.   On: 03/05/2018 03:19   Dg Chest Port 1 View  Result Date: 03/04/2018 CLINICAL DATA:  Intubated. EXAM: PORTABLE CHEST 1 VIEW COMPARISON:  12/15/2017. FINDINGS: Interval endotracheal tube with its tip 2 cm above the carina. This could be retracted 2 cm to place it at the level of the clavicles. Interval nasogastric or orogastric tube with its tip at the gastroesophageal junction and side hole in the distal esophagus. Poor inspiration with stable borderline enlarged cardiac silhouette and clear lungs. Cholecystectomy clips. Left subclavian and axillary stent. Unremarkable bones. IMPRESSION: 1. Endotracheal tube tip 2 cm above the carina. This could be retracted 2 cm to place it at the level of the clavicles. 2. Nasogastric tube tip at the gastroesophageal junction and side hole in the distal esophagus. It is recommended that this be advanced. 3. Stable borderline cardiomegaly. Electronically Signed   By: Claudie Revering M.D.   On: 03/04/2018 11:44    EKG:   Orders placed or performed during the hospital encounter of 03/04/18  . ED EKG  . ED EKG  . ED EKG  . ED EKG    ASSESSMENT AND PLAN:   72 year old female with past  medical history significant for bipolar disorder who was just discharged from [facility] unit on 03/03/2018, history of end-stage renal disease on hemodialysis, hypertension, polycystic kidney disease presents to hospital secondary to an unresponsive episode  1.  Unresponsiveness-likely metabolic or toxic encephalopathy -Currently not on sedation.  Occasional nonpurposeful movements. -EEG with generalized slowing noted.  Appreciate neurology consult. -If no improvement noted, consider MRI of the brain -CT of the head with no acute findings  2.  End-stage renal disease on dialysis-appreciate nephrology consult.  On Monday, Wednesday and Friday schedule.   -has a left AV fistula.  Last  dialysis on 03/05/18  3.  Bacteremia-positive blood cultures with gram-positive cocci.  Contaminant versus true infection. -Currently on Rocephin as species is identified as Streptococcus.  4.  Bipolar disorder-concern for drug overdose.  Intentional versus unintentional. -After extubation, will need psychiatry consult -Psych meds on hold for now.  5.  Anemia of chronic disease-continue EPO with dialysis.  6.  DVT prophylaxis-on subcutaneous heparin   Updated sister at bedside   All the records are reviewed and case discussed with Care Management/Social Workerr. Management plans discussed with the patient, family and they are in agreement.  CODE STATUS: Full Code  TOTAL TIME TAKING CARE OF THIS PATIENT: 39 minutes.   POSSIBLE D/C IN 2-3 DAYS, DEPENDING ON CLINICAL CONDITION.   Gladstone Lighter M.D on 03/06/2018 at 10:30 AM  Between 7am to 6pm - Pager - [phone]  After 6pm go to [email or web address] - password [email or web address] [facility]  Office  [phone]  CC: Primary care physician; Cletis Athens, MD

## 2018-03-06 NOTE — Progress Notes (Signed)
[facility]  ROUNDING NOTE   Subjective:     Patient remains critically ill, intubated.  No sedation Appears to have some spontaneous movements Not responding to voice commands Nursing staff reports some nonpurposeful movements  Objective:  Vital signs in last 24 hours:  Temp:  [97.8 F (36.6 C)-99.8 F (37.7 C)] 98.8 F (37.1 C) (08/17 0800) Pulse Rate:  [67-102] 86 (08/17 1100) Resp:  [10-18] 15 (08/17 1100) BP: (95-182)/(38-129) 148/56 (08/17 1100) SpO2:  [97 %-100 %] 100 % (08/17 1100) FiO2 (%):  [30 %] 30 % (08/17 1100) Weight:  [71.8 kg] 71.8 kg (08/17 0425)  Weight change: 3.8 kg Filed Weights   03/04/18 1054 03/04/18 1245 03/06/18 0425  Weight: 68 kg 69.9 kg 71.8 kg    Intake/Output: I/O last 3 completed shifts: In: 1455.8 [I.V.:1200; NG/GT:55.8; IV Piggyback:200] Out: 565 [Urine:535; Emesis/NG output:30]   Intake/Output this shift:  Total I/O In: 239.2 [NG/GT:139.2; IV Piggyback:100] Out: 150 [Urine:150]  Physical Exam: General: Critically ill  Head:  ET tube in place, OG tube with bloodstained aspirated  Eyes: Anicteric,  Neck: Supple,   Lungs:  Ventilator assisted, FiO2 30%  Heart: Regular rate and rhythm  Abdomen:  Soft, nondistended  Extremities:  no peripheral edema.  Neurologic:  not responding to verbal commands.    Skin: No lesions  Access: Left AVF  Foley with blood-tinged urine  Basic Metabolic Panel: Recent Labs  Lab 03/02/18 1013 03/04/18 1053 03/04/18 1341 03/05/18 0203 03/06/18 0815  NA 134* 135  --  136 141  K 3.8 4.5  --  4.8 3.9  CL 93* 100  --  103 104  CO2 32 27  --  24 28  GLUCOSE 93 127*  --  122* 141*  BUN 30* 18  --  22 24*  CREATININE 4.62* 3.85*  --  4.26* 3.47*  CALCIUM 9.0 8.4*  --  8.7* 9.1  MG  --   --  2.0 1.9  --   PHOS 4.0  --  3.5 5.2*  --     Liver Function Tests: Recent Labs  Lab 03/02/18 1013 03/04/18 1053  AST  --  21  ALT  --  15  ALKPHOS  --  66  BILITOT  --  0.7  PROT  --   5.7*  ALBUMIN 3.8 3.4*   No results for input(s): LIPASE, AMYLASE in the last 168 hours. No results for input(s): AMMONIA in the last 168 hours.  CBC: Recent Labs  Lab 03/02/18 1013 03/04/18 1053 03/05/18 0203  WBC 7.4 6.7 9.7  HGB 10.9* 9.8* 10.7*  HCT 30.8* 27.2* 31.2*  MCV 96.6 95.6 98.3  PLT 133* 142* 158    Cardiac Enzymes: Recent Labs  Lab 03/05/18 1817  TROPONINI 0.05*    BNP: Invalid input(s): POCBNP  CBG: Recent Labs  Lab 03/05/18 1946 03/06/18 0002 03/06/18 0335 03/06/18 0747 03/06/18 1140  GLUCAP 144* 114* 114* 128* 85*    Microbiology: Results for orders placed or performed during the hospital encounter of 03/04/18  Urine culture     Status: None   Collection Time: 03/04/18 11:03 AM  Result Value Ref Range Status   Specimen Description   Final    URINE, RANDOM Performed at [facility], [address] [place], [place] [address]    Special Requests   Final    NONE Performed at [facility], [address] [place], [place] [address]    Culture   Final  NO GROWTH Performed at [facility], [place] [address] [place], [place] [address]    Report Status 03/06/2018 FINAL  Final  Blood Cultures (routine x 2)     Status: Abnormal (Preliminary result)   Collection Time: 03/04/18 11:18 AM  Result Value Ref Range Status   Specimen Description   Final    BLOOD RIGHT ARM Performed at [facility], [place] [address], [place], [place] [address]    Special Requests   Final    BOTTLES DRAWN AEROBIC AND ANAEROBIC Blood Culture adequate volume Performed at [facility], [address] [place], [place] [address]    Culture  Setup Time   Final    GRAM POSITIVE COCCI AEROBIC BOTTLE ONLY CRITICAL RESULT CALLED TO, READ BACK BY AND VERIFIED WITH: DAVID BESANTI ON 03/05/18 AT 0109 JAG Performed at [facility], [place] [address], [place], [place] [address]    Culture VIRIDANS STREPTOCOCCUS (A)  Final   Report  Status PENDING  Incomplete  Blood Culture ID Panel (Reflexed)     Status: Abnormal   Collection Time: 03/04/18 11:18 AM  Result Value Ref Range Status   Enterococcus species NOT DETECTED NOT DETECTED Final   Listeria monocytogenes NOT DETECTED NOT DETECTED Final   Staphylococcus species NOT DETECTED NOT DETECTED Final   Staphylococcus aureus NOT DETECTED NOT DETECTED Final   Streptococcus species DETECTED (A) NOT DETECTED Final    Comment: Not Enterococcus species, Streptococcus agalactiae, Streptococcus pyogenes, or Streptococcus pneumoniae. CRITICAL RESULT CALLED TO, READ BACK BY AND VERIFIED WITH: DAVID BESANTI ON 03/05/18 AT 0109 BY JAG    Streptococcus agalactiae NOT DETECTED NOT DETECTED Final   Streptococcus pneumoniae NOT DETECTED NOT DETECTED Final   Streptococcus pyogenes NOT DETECTED NOT DETECTED Final   Acinetobacter baumannii NOT DETECTED NOT DETECTED Final   Enterobacteriaceae species NOT DETECTED NOT DETECTED Final   Enterobacter cloacae complex NOT DETECTED NOT DETECTED Final   Escherichia coli NOT DETECTED NOT DETECTED Final   Klebsiella oxytoca NOT DETECTED NOT DETECTED Final   Klebsiella pneumoniae NOT DETECTED NOT DETECTED Final   Proteus species NOT DETECTED NOT DETECTED Final   Serratia marcescens NOT DETECTED NOT DETECTED Final   Haemophilus influenzae NOT DETECTED NOT DETECTED Final   Neisseria meningitidis NOT DETECTED NOT DETECTED Final   Pseudomonas aeruginosa NOT DETECTED NOT DETECTED Final   Candida albicans NOT DETECTED NOT DETECTED Final   Candida glabrata NOT DETECTED NOT DETECTED Final   Candida krusei NOT DETECTED NOT DETECTED Final   Candida parapsilosis NOT DETECTED NOT DETECTED Final   Candida tropicalis NOT DETECTED NOT DETECTED Final    Comment: Performed at [facility], [address] [place], [place] [address]  MRSA PCR Screening     Status: None   Collection Time: 03/04/18 12:37 PM  Result Value Ref Range Status   MRSA by PCR  NEGATIVE NEGATIVE Final    Comment:        The GeneXpert MRSA Assay (FDA approved for NASAL specimens only), is one component of a comprehensive MRSA colonization surveillance program. It is not intended to diagnose MRSA infection nor to guide or monitor treatment for MRSA infections. Performed at [facility], [address] [place], [place] [address]   Culture, blood (Routine X 2) w Reflex to ID Panel     Status: None (Preliminary result)   Collection Time: 03/05/18  2:02 AM  Result Value Ref Range Status   Specimen Description BLOOD RIGHT HAND  Final   Special Requests   Final    BOTTLES DRAWN AEROBIC ONLY Blood Culture results may not be optimal due to an inadequate volume of blood received in culture bottles   Culture   Final    NO GROWTH 1 DAY Performed at [facility], [address] [place], [place] [address]    Report Status PENDING  Incomplete  Culture, blood (Routine X 2) w Reflex to ID Panel     Status: None (Preliminary result)   Collection Time: 03/05/18  2:03 AM  Result Value Ref Range Status   Specimen Description BLOOD RIGHT HAND  Final   Special Requests   Final    BOTTLES DRAWN AEROBIC ONLY Blood Culture results may not be optimal due to an inadequate volume of blood received in culture bottles   Culture   Final    NO GROWTH 1 DAY Performed at [facility], [address] [place], [place] [address]    Report Status PENDING  Incomplete    Coagulation Studies: No results for input(s): LABPROT, INR in the last 72 hours.  Urinalysis: Recent Labs    03/04/18 1102  COLORURINE YELLOW*  LABSPEC 1.008  PHURINE 7.0  GLUCOSEU NEGATIVE  HGBUR NEGATIVE  BILIRUBINUR NEGATIVE  KETONESUR NEGATIVE  PROTEINUR NEGATIVE  NITRITE NEGATIVE  LEUKOCYTESUR NEGATIVE      Imaging: Ct Head Wo Contrast  Result Date: 03/05/2018 CLINICAL DATA:  Patient found down yesterday. Possible drug overdose. EXAM: CT HEAD WITHOUT CONTRAST  TECHNIQUE: Contiguous axial images were obtained from the base of the skull through the vertex without intravenous contrast. COMPARISON:  Head CT scan 09/22/2017.  Brain MRI 09/02/2017. FINDINGS: Brain: No evidence of acute infarction, hemorrhage, hydrocephalus, extra-axial collection or mass lesion/mass effect. Cortical atrophy is noted. Vascular: No hyperdense vessel or unexpected calcification. Skull: Intact.  No focal lesion. Sinuses/Orbits: No acute or focal abnormality. Status post lens extraction. Other: None. IMPRESSION: No acute abnormality. Cortical atrophy. Electronically Signed   By: Inge Rise M.D.   On: 03/05/2018 11:22   Dg Chest Port 1 View  Result Date: 03/06/2018 CLINICAL DATA:  Ventilatory support, respiratory failure EXAM: PORTABLE CHEST 1 VIEW COMPARISON:  03/05/2018 FINDINGS: Endotracheal tube 2.9 cm above the carina. NG tube enters the stomach with the tip not visualized. Left axillary vascular stents again noted. Stable cardiomegaly without CHF or significant focal airspace process. No significant collapse or consolidation. No enlarging effusion or pneumothorax. Overall slight improvement in aeration. IMPRESSION: Stable support apparatus. Cardiomegaly without CHF Slight improved lung volumes. Electronically Signed   By: Jerilynn Mages.  Shick M.D.   On: 03/06/2018 09:35   Dg Chest Port 1 View  Result Date: 03/05/2018 CLINICAL DATA:  Respiratory failure EXAM: PORTABLE CHEST 1 VIEW COMPARISON:  03/04/2018 FINDINGS: Cardiac shadow is mildly enlarged but stable. Nasogastric catheter is noted within the stomach. Endotracheal tube is noted at the level of the carina. This should be withdrawn 2-3 cm. The lungs are clear. Vascular stent is noted on the left and stable. IMPRESSION: Endotracheal tube at the level of the carina. This should be withdrawn 2-3 cm. No other focal abnormality is noted. Electronically Signed   By: Inez Catalina M.D.   On: 03/05/2018 03:19     Medications:   .  cefTRIAXone (ROCEPHIN)  IV 2 g (03/06/18 1110)  . famotidine (PEPCID) IV Stopped (03/06/18 1055)  . feeding supplement (VITAL 1.5 CAL) Stopped (03/06/18 0755)  . phenylephrine (NEO-SYNEPHRINE) Adult infusion Stopped (03/04/18 1805)   . chlorhexidine  gluconate (MEDLINE KIT)  15 mL Mouth Rinse BID  . Chlorhexidine Gluconate Cloth  6 each Topical Q0600  . [START ON 03/08/2018] epoetin (EPOGEN/PROCRIT) injection  4,000 Units Intravenous Q M,W,F-HD  . heparin  5,000 Units Subcutaneous Q8H  . mouth rinse  15 mL Mouth Rinse 10 times per day  . midodrine  5 mg Oral BID WC   acetaminophen **OR** acetaminophen, fentaNYL (SUBLIMAZE) injection, fentaNYL (SUBLIMAZE) injection, ipratropium-albuterol, midazolam, midazolam, ondansetron **OR** ondansetron (ZOFRAN) IV  Assessment/ Plan:  [name] is a 72 y.o. white female with end stage renal disease on hemodialysis, hypertension, bipolar disorder, GERD, polycystic kidney disease  [facility] MWF left AVF  1. End Stage Renal Disease  Tolerated hemodialysis well yesterday.  Next hemodialysis planned for Monday  2.  Acute Resp failure - Vent assisted at present  3. Anemia of chronic kidney disease: hemoglobin 10.7 - EPO with HD  4.  Altered mental status Cause unclear Having some spontaneous movements Supportive care  5.  Secondary hyperparathyroidism Monitor phosphorus       LOS: 2 Joyce [name] 8/17/201912:27 PM

## 2018-03-06 NOTE — Progress Notes (Signed)
PHARMACIST - PHYSICIAN COMMUNICATION  CONCERNING:  Famotidine  20mg  Every 12 hours    RECOMMENDATION: Patient has PMH of ESRD and receives HD MWF. Famotidine dose should be 20mg  every 24 hours.   DESCRIPTION: Pharmacy has adjusted the dose to Famotidine 20mg  every 24 hours based on patient's renal function.    Pernell Dupre, PharmD, BCPS Clinical Pharmacist 03/06/2018 1:44 PM

## 2018-03-06 NOTE — Progress Notes (Signed)
Pt vomitted a moderate amount of bilious green emesis this morning.  Tube feedings stopped and Dr. Jefferson Fuel informed.  No new orders at this time.  Will continue to monitor.

## 2018-03-06 NOTE — Progress Notes (Signed)
Patient back on floor, bed changed, and CHG wipe down completed, gown changed. HR and BP elevated.

## 2018-03-06 NOTE — Consult Note (Signed)
Reason for Consult:AMS Referring Physician: Conforti  Interval History 03/06/2018: Unresponsive today, not on sedation, EEG did not show epileptic/epileptiform activity. Pending MRI brain.   CC: AMS  HPI: Joyce [name] is an 72 y.o. female with a history of depression who is unable to provide history due to mental status.  Family unavailable at this time therefore all history obtained from the chart.  Apparently mother went to check on patient today but there was no answer when she knocked on her door.  The police were called and found the patient unresponsive on the floor.  Apparently lots of pills were scattered around her.  Patient was brought in for evaluation.  Required intubation.  Is not sedated and has not been responsive.  Consult called for further recommendations.    Past Medical History:  Diagnosis Date  . Anal fissure   . Bipolar affective disorder ([place])   . CKD (chronic kidney disease)    Dr Holley Raring [name] 4  . Colon polyps   . Diverticulitis   . Diverticulitis   . Family history of adverse reaction to anesthesia    mom - PONV  . GERD (gastroesophageal reflux disease)   . Headache    migraines - none over 10 yrs  . Heart murmur   . History of hiatal hernia   . Hypertension   . Pancreatitis    Valproic acid  . Vertigo     Past Surgical History:  Procedure Laterality Date  . A/V FISTULAGRAM Left 04/14/2017   Procedure: A/V Fistulagram;  Surgeon: Katha Cabal, MD;  Location: [facility];  Service: Cardiovascular;  Laterality: Left;  . A/V FISTULAGRAM Left 06/09/2017   Procedure: A/V FISTULAGRAM;  Surgeon: Katha Cabal, MD;  Location: [facility];  Service: Cardiovascular;  Laterality: Left;  . A/V FISTULAGRAM Left 11/02/2017   Procedure: A/V FISTULAGRAM;  Surgeon: Algernon Huxley, MD;  Location: [place] [facility];  Service: Cardiovascular;  Laterality: Left;  . A/V SHUNT INTERVENTION N/A 11/02/2017   Procedure: A/V SHUNT INTERVENTION;   Surgeon: Algernon Huxley, MD;  Location: [facility];  Service: Cardiovascular;  Laterality: N/A;  . ABDOMINAL HYSTERECTOMY  1990 ?  . AV FISTULA PLACEMENT  4/30  . BREAST EXCISIONAL BIOPSY Left 1994   neg surgical bx  . CHOLECYSTECTOMY  2003  . COLONOSCOPY  2014   Dr. Jamal Collin  . COLONOSCOPY WITH PROPOFOL N/A 09/24/2015   Procedure: COLONOSCOPY WITH random colon byopies.;  Surgeon: Lucilla Lame, MD;  Location: [facility];  Service: Endoscopy;  Laterality: N/A;  . ESOPHAGOGASTRODUODENOSCOPY (EGD) WITH PROPOFOL N/A 09/24/2015   Procedure: ESOPHAGOGASTRODUODENOSCOPY (EGD) ;  Surgeon: Lucilla Lame, MD;  Location: [facility];  Service: Endoscopy;  Laterality: N/A;  . EYE SURGERY    . PERIPHERAL VASCULAR CATHETERIZATION N/A 05/29/2015   Procedure: A/V Shuntogram/Fistulagram;  Surgeon: Katha Cabal, MD;  Location: [facility];  Service: Cardiovascular;  Laterality: N/A;  . PERIPHERAL VASCULAR CATHETERIZATION N/A 05/29/2015   Procedure: A/V Shunt Intervention;  Surgeon: Katha Cabal, MD;  Location: [facility];  Service: Cardiovascular;  Laterality: N/A;  . POLYPECTOMY  09/24/2015   Procedure: POLYPECTOMY INTESTINAL;  Surgeon: Lucilla Lame, MD;  Location: [place] [facility];  Service: Endoscopy;;  cecal polyp ascending polyp    Family History  Problem Relation Age of Onset  . Stroke Father   . Hypertension Father   . Breast cancer Other   . Colon cancer Neg Hx   .  Liver disease Neg Hx     Social History:  reports that she has never smoked. She has never used smokeless tobacco. She reports that she does not drink alcohol or use drugs.  Allergies  Allergen Reactions  . Morphine And Related Shortness Of Breath    Pt reports chest pain and difficulty breathing.  . Indomethacin Hives  . Pollen Extract Other (See Comments)    Sinus problems and HA    Medications:  I have reviewed the patient's current medications. Prior to Admission:   Medications Prior to Admission  Medication Sig Dispense Refill Last Dose  . acetaminophen (TYLENOL) 325 MG tablet Take 325 mg 2 (two) times daily as needed by mouth for moderate pain or headache.    PRN at PRN  . brimonidine (ALPHAGAN) 0.2 % ophthalmic solution Place 1 drop into both eyes 2 (two) times daily. 5 mL 1 Unknown at Unknown  . carbamazepine (TEGRETOL) 200 MG tablet Take 1 tablet (200 mg total) by mouth 3 (three) times daily. 90 tablet 3   . cholecalciferol (VITAMIN D) 1000 UNITS tablet Take 1,000 Units by mouth daily.   Unknown at Unknown  . cyanocobalamin (,VITAMIN B-12,) 1000 MCG/ML injection Inject 1,000 mcg into the muscle every 30 (thirty) days.   Unknown at Unknown  . dicyclomine (BENTYL) 20 MG tablet Take 1 tablet (20 mg total) by mouth 3 (three) times daily before meals. 90 tablet 1 Unknown at Unknown  . famotidine (PEPCID) 20 MG tablet TAKE 1 TABLET BY MOUTH EVERY DAY 30 tablet 1 Unknown at Unknown  . haloperidol (HALDOL) 5 MG tablet Take 0.5 tablets (2.5 mg total) by mouth every Monday, Wednesday, and Friday with hemodialysis AND 1 tablet (5 mg total) at bedtime. 36 tablet 3   . lidocaine-prilocaine (EMLA) cream Apply 1 application every Monday, Wednesday, and Friday topically. At dialysis  3 PRN at PRN  . metoprolol tartrate (LOPRESSOR) 25 MG tablet Take 1 tablet (25 mg total) by mouth 2 (two) times daily. 60 tablet 1   . rOPINIRole (REQUIP) 2 MG tablet Take 1 tablet (2 mg total) by mouth daily. 30 tablet 3   . temazepam (RESTORIL) 15 MG capsule Take 1 capsule (15 mg total) by mouth at bedtime as needed for sleep. 30 capsule 0 PRN at PRN  . timolol (TIMOPTIC) 0.5 % ophthalmic solution Place 1 drop into both eyes 2 (two) times daily. 10 mL 1 Unknown at Unknown  . lidocaine (LIDODERM) 5 % Place 1 patch onto the skin daily. Remove & Discard patch within 12 hours or as directed by MD (Patient not taking: Reported on 02/26/2018) 30 patch 0 Not Taking at Unknown time  . multivitamin  (RENA-VIT) TABS tablet TAKE 1 TABLET BY MOUTH EVERYDAY AT BEDTIME (Patient taking differently: No sig reported) 30 tablet 0 Unknown at Unknown   Scheduled: . chlorhexidine gluconate (MEDLINE KIT)  15 mL Mouth Rinse BID  . Chlorhexidine Gluconate Cloth  6 each Topical Q0600  . [START ON 03/08/2018] epoetin (EPOGEN/PROCRIT) injection  4,000 Units Intravenous Q M,W,F-HD  . heparin  5,000 Units Subcutaneous Q8H  . mouth rinse  15 mL Mouth Rinse 10 times per day  . midodrine  5 mg Oral BID WC    ROS: Unable to obtain due to mental status  Physical Examination: Blood pressure (!) 148/56, pulse 86, temperature 98.8 F (37.1 C), temperature source Axillary, resp. rate 15, height '5\' 3"'$  (1.6 m), weight 71.8 kg, SpO2 100 %.  HEENT-  Normocephalic, no lesions,  without obvious abnormality.  Normal external eye and conjunctiva.  Normal TM's bilaterally.  Normal auditory canals and external ears. Normal external nose, mucus membranes and septum.  Normal pharynx. Cardiovascular- S1, S2 normal, pulses palpable throughout   Lungs- chest clear, no wheezing, rales, normal symmetric air entry Abdomen- soft, non-tender; bowel sounds normal; no masses,  no organomegaly Extremities- no edema Lymph-no adenopathy palpable Musculoskeletal-no joint tenderness, deformity or swelling Skin-warm and dry, no hyperpigmentation, vitiligo, or suspicious lesions  Neurological Examination  Mental Status: Patient does not respond to verbal stimuli.  Does not respond to deep sternal rub.  Does not follow commands.  No verbalizations noted.  Cranial Nerves: II: patient does not respond confrontation bilaterally, pupils right 4 mm, left 3 mm,and unreactive bilaterally III,IV,VI: doll's response minimal bilaterally.  V,VII: corneal reflex positive bilaterally  VIII: patient does not respond to verbal stimuli IX,X: gag reflex intact , XI: trapezius strength unable to test bilaterally XII: tongue strength unable to  test Motor: Extremities flaccid throughout.  No spontaneous movement noted.  No purposeful movements noted.  Sensory: Postures on the right to nox stim  Deep Tendon Reflexes:  Absent throughout. Plantars: Mute bilaterally Cerebellar: Unable to perform   Laboratory Studies:   Basic Metabolic Panel: Recent Labs  Lab 03/02/18 1013 03/04/18 1053 03/04/18 1341 03/05/18 0203 03/06/18 0815  NA 134* 135  --  136 141  K 3.8 4.5  --  4.8 3.9  CL 93* 100  --  103 104  CO2 32 27  --  24 28  GLUCOSE 93 127*  --  122* 141*  BUN 30* 18  --  22 24*  CREATININE 4.62* 3.85*  --  4.26* 3.47*  CALCIUM 9.0 8.4*  --  8.7* 9.1  MG  --   --  2.0 1.9  --   PHOS 4.0  --  3.5 5.2*  --     Liver Function Tests: Recent Labs  Lab 03/02/18 1013 03/04/18 1053  AST  --  21  ALT  --  15  ALKPHOS  --  66  BILITOT  --  0.7  PROT  --  5.7*  ALBUMIN 3.8 3.4*   No results for input(s): LIPASE, AMYLASE in the last 168 hours. No results for input(s): AMMONIA in the last 168 hours.  CBC: Recent Labs  Lab 03/02/18 1013 03/04/18 1053 03/05/18 0203  WBC 7.4 6.7 9.7  HGB 10.9* 9.8* 10.7*  HCT 30.8* 27.2* 31.2*  MCV 96.6 95.6 98.3  PLT 133* 142* 158    Cardiac Enzymes: Recent Labs  Lab 03/05/18 1817  TROPONINI 0.05*    BNP: Invalid input(s): POCBNP  CBG: Recent Labs  Lab 03/05/18 1946 03/06/18 0002 03/06/18 0335 03/06/18 0747 03/06/18 1140  GLUCAP 144* 114* 114* 128* 60*    Microbiology: Results for orders placed or performed during the hospital encounter of 03/04/18  Urine culture     Status: None   Collection Time: 03/04/18 11:03 AM  Result Value Ref Range Status   Specimen Description   Final    URINE, RANDOM Performed at [facility], [address] [place], [place] [address]    Special Requests   Final    NONE Performed at [facility], [address], [place], [place] [address]    Culture   Final    NO GROWTH Performed at [facility], [place] [address] [place], [place] [address]    Report Status 03/06/2018 FINAL  Final  Blood  Cultures (routine x 2)     Status: Abnormal (Preliminary result)   Collection Time: 03/04/18 11:18 AM  Result Value Ref Range Status   Specimen Description   Final    BLOOD RIGHT ARM Performed at [facility], [address], [place], [place] [address]    Special Requests   Final    BOTTLES DRAWN AEROBIC AND ANAEROBIC Blood Culture adequate volume Performed at [facility], [address] [place], [place] [address]    Culture  Setup Time   Final    GRAM POSITIVE COCCI AEROBIC BOTTLE ONLY CRITICAL RESULT CALLED TO, READ BACK BY AND VERIFIED WITH: DAVID BESANTI ON 03/05/18 AT 0109 JAG Performed at [facility], [place] [address] [place], [place] [address]    Culture VIRIDANS STREPTOCOCCUS (A)  Final   Report Status PENDING  Incomplete  Blood Culture ID Panel (Reflexed)     Status: Abnormal   Collection Time: 03/04/18 11:18 AM  Result Value Ref Range Status   Enterococcus species NOT DETECTED NOT DETECTED Final   Listeria monocytogenes NOT DETECTED NOT DETECTED Final   Staphylococcus species NOT DETECTED NOT DETECTED Final   Staphylococcus aureus NOT DETECTED NOT DETECTED Final   Streptococcus species DETECTED (A) NOT DETECTED Final    Comment: Not Enterococcus species, Streptococcus agalactiae, Streptococcus pyogenes, or Streptococcus pneumoniae. CRITICAL RESULT CALLED TO, READ BACK BY AND VERIFIED WITH: DAVID BESANTI ON 03/05/18 AT 0109 BY JAG    Streptococcus agalactiae NOT DETECTED NOT DETECTED Final   Streptococcus pneumoniae NOT DETECTED NOT DETECTED Final   Streptococcus pyogenes NOT DETECTED NOT DETECTED Final   Acinetobacter baumannii NOT DETECTED NOT DETECTED Final   Enterobacteriaceae species NOT DETECTED NOT DETECTED Final   Enterobacter cloacae complex NOT DETECTED NOT DETECTED Final   Escherichia coli NOT DETECTED NOT DETECTED Final   Klebsiella  oxytoca NOT DETECTED NOT DETECTED Final   Klebsiella pneumoniae NOT DETECTED NOT DETECTED Final   Proteus species NOT DETECTED NOT DETECTED Final   Serratia marcescens NOT DETECTED NOT DETECTED Final   Haemophilus influenzae NOT DETECTED NOT DETECTED Final   Neisseria meningitidis NOT DETECTED NOT DETECTED Final   Pseudomonas aeruginosa NOT DETECTED NOT DETECTED Final   Candida albicans NOT DETECTED NOT DETECTED Final   Candida glabrata NOT DETECTED NOT DETECTED Final   Candida krusei NOT DETECTED NOT DETECTED Final   Candida parapsilosis NOT DETECTED NOT DETECTED Final   Candida tropicalis NOT DETECTED NOT DETECTED Final    Comment: Performed at [facility], [address] [place], [place] [address]  MRSA PCR Screening     Status: None   Collection Time: 03/04/18 12:37 PM  Result Value Ref Range Status   MRSA by PCR NEGATIVE NEGATIVE Final    Comment:        The GeneXpert MRSA Assay (FDA approved for NASAL specimens only), is one component of a comprehensive MRSA colonization surveillance program. It is not intended to diagnose MRSA infection nor to guide or monitor treatment for MRSA infections. Performed at [facility], [address] [place], [place] [address]   Culture, blood (Routine X 2) w Reflex to ID Panel     Status: None (Preliminary result)   Collection Time: 03/05/18  2:02 AM  Result Value Ref Range Status   Specimen Description BLOOD RIGHT HAND  Final   Special Requests   Final    BOTTLES DRAWN AEROBIC ONLY Blood Culture results may not be optimal due to an inadequate volume  of blood received in culture bottles   Culture   Final    NO GROWTH 1 DAY Performed at [facility], [address] [place], [place] [address]    Report Status PENDING  Incomplete  Culture, blood (Routine X 2) w Reflex to ID Panel     Status: None (Preliminary result)   Collection Time: 03/05/18  2:03 AM  Result Value Ref Range Status   Specimen  Description BLOOD RIGHT HAND  Final   Special Requests   Final    BOTTLES DRAWN AEROBIC ONLY Blood Culture results may not be optimal due to an inadequate volume of blood received in culture bottles   Culture   Final    NO GROWTH 1 DAY Performed at [facility], [address] [place], [place] [address]    Report Status PENDING  Incomplete    Coagulation Studies: No results for input(s): LABPROT, INR in the last 72 hours.  Urinalysis:  Recent Labs  Lab 03/04/18 1102  COLORURINE YELLOW*  LABSPEC 1.008  PHURINE 7.0  GLUCOSEU NEGATIVE  HGBUR NEGATIVE  BILIRUBINUR NEGATIVE  KETONESUR NEGATIVE  PROTEINUR NEGATIVE  NITRITE NEGATIVE  LEUKOCYTESUR NEGATIVE    Lipid Panel:     Component Value Date/Time   CHOL 251 (H) 09/18/2017 0646   CHOL 105 05/14/2014 0412   TRIG 124 09/18/2017 0646   TRIG 127 05/14/2014 0412   HDL 66 09/18/2017 0646   HDL 37 (L) 05/14/2014 0412   CHOLHDL 3.8 09/18/2017 0646   VLDL 25 09/18/2017 0646   VLDL 25 05/14/2014 0412   LDLCALC 160 (H) 09/18/2017 0646   LDLCALC 43 05/14/2014 0412    HgbA1C:  Lab Results  Component Value Date   HGBA1C 4.8 09/18/2017    Urine Drug Screen:      Component Value Date/Time   LABOPIA NONE DETECTED 03/04/2018 1103   COCAINSCRNUR NONE DETECTED 03/04/2018 1103   LABBENZ TEST NOT PERFORMED, REAGENT NOT AVAILABLE (A) 03/04/2018 1103   AMPHETMU NONE DETECTED 03/04/2018 1103   THCU NONE DETECTED 03/04/2018 1103   LABBARB NONE DETECTED 03/04/2018 1103    Alcohol Level:  Recent Labs  Lab 03/04/18 1341  ETH <10     Imaging: Ct Head Wo Contrast  Result Date: 03/05/2018 CLINICAL DATA:  Patient found down yesterday. Possible drug overdose. EXAM: CT HEAD WITHOUT CONTRAST TECHNIQUE: Contiguous axial images were obtained from the base of the skull through the vertex without intravenous contrast. COMPARISON:  Head CT scan 09/22/2017.  Brain MRI 09/02/2017. FINDINGS: Brain: No evidence of acute infarction,  hemorrhage, hydrocephalus, extra-axial collection or mass lesion/mass effect. Cortical atrophy is noted. Vascular: No hyperdense vessel or unexpected calcification. Skull: Intact.  No focal lesion. Sinuses/Orbits: No acute or focal abnormality. Status post lens extraction. Other: None. IMPRESSION: No acute abnormality. Cortical atrophy. Electronically Signed   By: Inge Rise M.D.   On: 03/05/2018 11:22   Dg Chest Port 1 View  Result Date: 03/06/2018 CLINICAL DATA:  Ventilatory support, respiratory failure EXAM: PORTABLE CHEST 1 VIEW COMPARISON:  03/05/2018 FINDINGS: Endotracheal tube 2.9 cm above the carina. NG tube enters the stomach with the tip not visualized. Left axillary vascular stents again noted. Stable cardiomegaly without CHF or significant focal airspace process. No significant collapse or consolidation. No enlarging effusion or pneumothorax. Overall slight improvement in aeration. IMPRESSION: Stable support apparatus. Cardiomegaly without CHF Slight improved lung volumes. Electronically Signed   By: Jerilynn Mages.  Shick M.D.   On: 03/06/2018 09:35   Dg Chest [facility]  1 View  Result Date: 03/05/2018 CLINICAL DATA:  Respiratory failure EXAM: PORTABLE CHEST 1 VIEW COMPARISON:  03/04/2018 FINDINGS: Cardiac shadow is mildly enlarged but stable. Nasogastric catheter is noted within the stomach. Endotracheal tube is noted at the level of the carina. This should be withdrawn 2-3 cm. The lungs are clear. Vascular stent is noted on the left and stable. IMPRESSION: Endotracheal tube at the level of the carina. This should be withdrawn 2-3 cm. No other focal abnormality is noted. Electronically Signed   By: Inez Catalina M.D.   On: 03/05/2018 03:19     Assessment/Plan: 72 year old female presenting after what was felt to be an intentional overdose.  Patient exam improved has cranial nerves today but she is posturing on the right and nothing purposeful.   Head CT reviewed and unremarkable.  EEG slow.  There is  evidence of AKI.  Hepatic function appears preserved.  UDS negative.  Unclear what patient may have taken.  There are some ingestions that can give the appearance of significant brain injury but can resolve with clearance.  Would continue current management.  If no improvement clinically would consider MRI of the brain.   Sarina Ill, MD Neurology [phone] 03/06/2018, 1:28 PM

## 2018-03-07 ENCOUNTER — Inpatient Hospital Stay
Admit: 2018-03-07 | Discharge: 2018-03-07 | Disposition: A | Discharge status: Home / Self Care | Payer: Medicare HMO | Attending: Internal Medicine | Admitting: Internal Medicine

## 2018-03-07 ENCOUNTER — Inpatient Hospital Stay: Payer: Medicare HMO

## 2018-03-07 LAB — GLUCOSE, CAPILLARY
GLUCOSE-CAPILLARY: 122 mg/dL — AB (ref 70–99)
Glucose-Capillary: 106 mg/dL — ABNORMAL HIGH (ref 70–99)
Glucose-Capillary: 119 mg/dL — ABNORMAL HIGH (ref 70–99)
Glucose-Capillary: 95 mg/dL (ref 70–99)
Glucose-Capillary: 115 mg/dL — ABNORMAL HIGH (ref 70–99)
Glucose-Capillary: 89 mg/dL (ref 70–99)

## 2018-03-07 LAB — URINALYSIS, COMPLETE (UACMP) WITH MICROSCOPIC
BILIRUBIN URINE: NEGATIVE
GLUCOSE, UA: NEGATIVE mg/dL
KETONES UR: NEGATIVE mg/dL
NITRITE: NEGATIVE
PROTEIN: 30 mg/dL — AB
Specific Gravity, Urine: 1.011 (ref 1.005–1.030)
pH: 5 (ref 5.0–8.0)

## 2018-03-07 LAB — CULTURE, BLOOD (ROUTINE X 2): SPECIAL REQUESTS: ADEQUATE

## 2018-03-07 LAB — ECHOCARDIOGRAM COMPLETE
Height: 63 in
Weight: 2370.39 oz

## 2018-03-07 MED ORDER — HYDRALAZINE HCL 20 MG/ML IJ SOLN
10.0000 mg | Freq: Once | INTRAMUSCULAR | Status: AC
  Administered 2018-03-07: 10 mg via INTRAVENOUS
  Filled 2018-03-07: qty 1

## 2018-03-07 NOTE — Progress Notes (Signed)
[facility] NAME: Joyce [name]    MR#:  [id number]  DATE OF BIRTH:  1946-01-29  SUBJECTIVE:  CHIEF COMPLAINT:   Chief Complaint  Patient presents with  . Other    Unresponsive   -Blood cultures growing strep viridans bacteremia.  Recently discharged from psychiatry unit 03/03/18 -Not on sedation.  Nonpurposeful movements occasionally noted  REVIEW OF SYSTEMS:  Review of Systems  Unable to perform ROS: Critical illness    DRUG ALLERGIES:   Allergies  Allergen Reactions  . Morphine And Related Shortness Of Breath    Pt reports chest pain and difficulty breathing.  . Indomethacin Hives  . Pollen Extract Other (See Comments)    Sinus problems and HA    VITALS:  Blood pressure (!) 144/63, pulse 98, temperature 98.6 F (37 C), temperature source Axillary, resp. rate 13, height 5\' 3"  (1.6 m), weight 67.2 kg, SpO2 98 %.  PHYSICAL EXAMINATION:  Physical Exam   GENERAL:  72 y.o.-year-old patient lying in the bed with no acute distress. Critically ill appearing EYES: Pupils equal, round, reactive to light and accommodation. No scleral icterus. Extraocular muscles intact.  HEENT: Head atraumatic, normocephalic. Oropharynx and nasopharynx clear.  NECK:  Supple, no jugular venous distention. No thyroid enlargement, no tenderness.  LUNGS: Normal breath sounds bilaterally, no wheezing, rales,rhonchi or crepitation. No use of accessory muscles of respiration.  CARDIOVASCULAR: S1, S2 normal. No murmurs, rubs, or gallops.  ABDOMEN: Soft, nontender, nondistended. Bowel sounds present. No organomegaly or mass.  EXTREMITIES: No pedal edema, cyanosis, or clubbing.  Left arm AV fistula NEUROLOGIC: no facial droop, some movements of the limbs noted.. Not following commands No posturing  PSYCHIATRIC: The patient is not responding  SKIN: No obvious rash, lesion, or ulcer.    LABORATORY PANEL:   CBC Recent Labs  Lab 03/05/18 0203  WBC  9.7  HGB 10.7*  HCT 31.2*  PLT 158   ------------------------------------------------------------------------------------------------------------------  Chemistries  Recent Labs  Lab 03/04/18 1053  03/05/18 0203 03/06/18 0815  NA 135  --  136 141  K 4.5  --  4.8 3.9  CL 100  --  103 104  CO2 27  --  24 28  GLUCOSE 127*  --  122* 141*  BUN 18  --  22 24*  CREATININE 3.85*  --  4.26* 3.47*  CALCIUM 8.4*  --  8.7* 9.1  MG  --    < > 1.9  --   AST 21  --   --   --   ALT 15  --   --   --   ALKPHOS 66  --   --   --   BILITOT 0.7  --   --   --    < > = values in this interval not displayed.   ------------------------------------------------------------------------------------------------------------------  Cardiac Enzymes Recent Labs  Lab 03/05/18 1817  TROPONINI 0.05*   ------------------------------------------------------------------------------------------------------------------  RADIOLOGY:  Ct Head Wo Contrast  Result Date: 03/05/2018 CLINICAL DATA:  Patient found down yesterday. Possible drug overdose. EXAM: CT HEAD WITHOUT CONTRAST TECHNIQUE: Contiguous axial images were obtained from the base of the skull through the vertex without intravenous contrast. COMPARISON:  Head CT scan 09/22/2017.  Brain MRI 09/02/2017. FINDINGS: Brain: No evidence of acute infarction, hemorrhage, hydrocephalus, extra-axial collection or mass lesion/mass effect. Cortical atrophy is noted. Vascular: No hyperdense vessel or unexpected calcification. Skull: Intact.  No focal lesion. Sinuses/Orbits: No acute or focal abnormality. Status  post lens extraction. Other: None. IMPRESSION: No acute abnormality. Cortical atrophy. Electronically Signed   By: Inge Rise M.D.   On: 03/05/2018 11:22   Mr Brain Wo Contrast  Result Date: 03/06/2018 CLINICAL DATA:  Initial evaluation for acute altered mental status, encephalopathy. EXAM: MRI HEAD WITHOUT CONTRAST TECHNIQUE: Multiplanar, multiecho pulse  sequences of the brain and surrounding structures were obtained without intravenous contrast. COMPARISON:  Prior CT from 03/05/2018. FINDINGS: Brain: Generalized age-related cerebral atrophy. Few scattered T2/FLAIR hyperintensities noted within the periventricular deep white matter both cerebral hemispheres, nonspecific, but mild for age. No evidence for acute or subacute infarct. Gray-white matter differentiation maintained. No areas of remote cortical infarction. No acute or chronic intracranial hemorrhage. No mass lesion, midline shift or mass effect. No hydrocephalus. No extra-axial fluid collection. Normal pituitary gland. Vascular: Major intracranial vascular flow voids are maintained Skull and upper cervical spine: Craniocervical junction normal. Upper cervical spine normal. No focal marrow replacing lesion. Scalp soft tissues unremarkable. Sinuses/Orbits: Globes and orbital soft tissues demonstrate no acute finding. Patient status post ocular lens replacement bilaterally. Paranasal sinuses largely clear. Fluid within the nasopharynx. Patient appears to be intubated. No significant mastoid effusion. Inner ear structures grossly normal. Other: None. IMPRESSION: 1. No acute intracranial abnormality. 2. Age-related cerebral atrophy with mild bile specific cerebral white matter changes. Electronically Signed   By: Jeannine Boga M.D.   On: 03/06/2018 22:46   Dg Chest Port 1 View  Result Date: 03/06/2018 CLINICAL DATA:  Ventilatory support, respiratory failure EXAM: PORTABLE CHEST 1 VIEW COMPARISON:  03/05/2018 FINDINGS: Endotracheal tube 2.9 cm above the carina. NG tube enters the stomach with the tip not visualized. Left axillary vascular stents again noted. Stable cardiomegaly without CHF or significant focal airspace process. No significant collapse or consolidation. No enlarging effusion or pneumothorax. Overall slight improvement in aeration. IMPRESSION: Stable support apparatus. Cardiomegaly  without CHF Slight improved lung volumes. Electronically Signed   By: Jerilynn Mages.  Shick M.D.   On: 03/06/2018 09:35    EKG:   Orders placed or performed during the hospital encounter of 03/04/18  . ED EKG  . ED EKG  . ED EKG  . ED EKG    ASSESSMENT AND PLAN:   72 year old female with past medical history significant for bipolar disorder who was just discharged from [facility] unit on 03/03/2018, history of end-stage renal disease on hemodialysis, hypertension, polycystic kidney disease presents to hospital secondary to an unresponsive episode  1.  Unresponsiveness-likely metabolic or toxic encephalopathy -Currently not on sedation.  Occasional nonpurposeful movements. -EEG with generalized slowing noted.  Appreciate neurology consult. -MRI of the brain-no acute intracranial abnormality.  Age-related cerebral atrophy noted -CT of the head with no acute findings  2.  End-stage renal disease on dialysis-appreciate nephrology consult.  On Monday, Wednesday and Friday schedule.   -has a left AV fistula.  Last dialysis on 03/05/18 - due for dialysis again tomorrow  3.  Bacteremia-positive blood cultures with strep viridans. -Echocardiogram has been ordered.   -Continue Rocephin  4.  Bipolar disorder-concern for drug overdose.  Intentional versus unintentional. -After extubation, will need psychiatry consult -Psych meds on hold for now.  5.  Anemia of chronic disease-continue EPO with dialysis.  6.  DVT prophylaxis-on subcutaneous heparin     All the records are reviewed and case discussed with Care Management/Social Workerr. Management plans discussed with the patient, family and they are in agreement.  CODE STATUS: Full Code  TOTAL TIME TAKING CARE OF THIS PATIENT:  26 minutes.   POSSIBLE D/C IN 2-3 DAYS, DEPENDING ON CLINICAL CONDITION.   Gladstone Lighter M.D on 03/07/2018 at 9:54 AM  Between 7am to 6pm - Pager - [phone]  After 6pm go to [email or web address] -  password [email or web address] [facility]  Office  [phone]  CC: Primary care physician; Cletis Athens, MD

## 2018-03-07 NOTE — Progress Notes (Signed)
[facility]  ROUNDING NOTE   Subjective:     Patient remains critically ill, intubated.   Appears to have some spontaneous non purposeful movements Not responding to voice commands Nursing staff reports that she has been agitated intermittently and has been requiring intermittent sedation  Objective:  Vital signs in last 24 hours:  Temp:  [98 F (36.7 C)-98.6 F (37 C)] 98.6 F (37 C) (08/18 0800) Pulse Rate:  [84-105] 97 (08/18 1000) Resp:  [11-20] 16 (08/18 1000) BP: (122-177)/(51-129) 140/60 (08/18 1000) SpO2:  [92 %-100 %] 97 % (08/18 1000) FiO2 (%):  [24 %-30 %] 24 % (08/18 0853) Weight:  [67.2 kg] 67.2 kg (08/18 0421)  Weight change: -4.6 kg Filed Weights   03/04/18 1245 03/06/18 0425 03/07/18 0421  Weight: 69.9 kg 71.8 kg 67.2 kg    Intake/Output: I/O last 3 completed shifts: In: 339.2 [NG/GT:139.2; IV Piggyback:200] Out: 890 [Urine:890]   Intake/Output this shift:  Total I/O In: 150 [IV Piggyback:150] Out: 125 [Urine:125]  Physical Exam: General: Critically ill  Head:  ET tube in place, OG tube with bloodstained aspirated  Eyes: Anicteric,  Neck: Supple,   Lungs:  Ventilator assisted, FiO2 25%  Heart: Regular rate and rhythm  Abdomen:  Soft, nondistended  Extremities:  no peripheral edema.  Neurologic:  not responding to verbal commands.    Skin: No lesions  Access: Left AVF  Foley with blood-tinged urine  Basic Metabolic Panel: Recent Labs  Lab 03/02/18 1013 03/04/18 1053 03/04/18 1341 03/05/18 0203 03/06/18 0815  NA 134* 135  --  136 141  K 3.8 4.5  --  4.8 3.9  CL 93* 100  --  103 104  CO2 32 27  --  24 28  GLUCOSE 93 127*  --  122* 141*  BUN 30* 18  --  22 24*  CREATININE 4.62* 3.85*  --  4.26* 3.47*  CALCIUM 9.0 8.4*  --  8.7* 9.1  MG  --   --  2.0 1.9  --   PHOS 4.0  --  3.5 5.2*  --     Liver Function Tests: Recent Labs  Lab 03/02/18 1013 03/04/18 1053  AST  --  21  ALT  --  15  ALKPHOS  --  66  BILITOT  --   0.7  PROT  --  5.7*  ALBUMIN 3.8 3.4*   No results for input(s): LIPASE, AMYLASE in the last 168 hours. No results for input(s): AMMONIA in the last 168 hours.  CBC: Recent Labs  Lab 03/02/18 1013 03/04/18 1053 03/05/18 0203  WBC 7.4 6.7 9.7  HGB 10.9* 9.8* 10.7*  HCT 30.8* 27.2* 31.2*  MCV 96.6 95.6 98.3  PLT 133* 142* 158    Cardiac Enzymes: Recent Labs  Lab 03/05/18 1817  TROPONINI 0.05*    BNP: Invalid input(s): POCBNP  CBG: Recent Labs  Lab 03/06/18 1550 03/06/18 1939 03/07/18 0005 03/07/18 0400 03/07/18 0720  GLUCAP 121* 120* 122* 106* 57*    Microbiology: Results for orders placed or performed during the hospital encounter of 03/04/18  Urine culture     Status: None   Collection Time: 03/04/18 11:03 AM  Result Value Ref Range Status   Specimen Description   Final    URINE, RANDOM Performed at [facility], [address], [place], [place] [address]    Special Requests   Final    NONE Performed at [facility], [address], [place], [place] [address]  Culture   Final    NO GROWTH Performed at [facility], [place] [address], [place], [place] [address]    Report Status 03/06/2018 FINAL  Final  Blood Cultures (routine x 2)     Status: Abnormal   Collection Time: 03/04/18 11:18 AM  Result Value Ref Range Status   Specimen Description   Final    BLOOD RIGHT ARM Performed at [facility], [place] [address], [place], [place] [address]    Special Requests   Final    BOTTLES DRAWN AEROBIC AND ANAEROBIC Blood Culture adequate volume Performed at [facility], [address] [place], [place] [address]    Culture  Setup Time   Final    GRAM POSITIVE COCCI AEROBIC BOTTLE ONLY CRITICAL RESULT CALLED TO, READ BACK BY AND VERIFIED WITH: DAVID BESANTI ON 03/05/18 AT 0109 JAG    Culture (A)  Final    VIRIDANS STREPTOCOCCUS THE SIGNIFICANCE OF ISOLATING THIS ORGANISM FROM A SINGLE SET OF BLOOD CULTURES WHEN  MULTIPLE SETS ARE DRAWN IS UNCERTAIN. PLEASE NOTIFY THE MICROBIOLOGY DEPARTMENT WITHIN ONE WEEK IF SPECIATION AND SENSITIVITIES ARE REQUIRED. Performed at [facility], [place] [address], [place], [place] [address]    Report Status 03/07/2018 FINAL  Final  Blood Culture ID Panel (Reflexed)     Status: Abnormal   Collection Time: 03/04/18 11:18 AM  Result Value Ref Range Status   Enterococcus species NOT DETECTED NOT DETECTED Final   Listeria monocytogenes NOT DETECTED NOT DETECTED Final   Staphylococcus species NOT DETECTED NOT DETECTED Final   Staphylococcus aureus NOT DETECTED NOT DETECTED Final   Streptococcus species DETECTED (A) NOT DETECTED Final    Comment: Not Enterococcus species, Streptococcus agalactiae, Streptococcus pyogenes, or Streptococcus pneumoniae. CRITICAL RESULT CALLED TO, READ BACK BY AND VERIFIED WITH: DAVID BESANTI ON 03/05/18 AT 0109 BY JAG    Streptococcus agalactiae NOT DETECTED NOT DETECTED Final   Streptococcus pneumoniae NOT DETECTED NOT DETECTED Final   Streptococcus pyogenes NOT DETECTED NOT DETECTED Final   Acinetobacter baumannii NOT DETECTED NOT DETECTED Final   Enterobacteriaceae species NOT DETECTED NOT DETECTED Final   Enterobacter cloacae complex NOT DETECTED NOT DETECTED Final   Escherichia coli NOT DETECTED NOT DETECTED Final   Klebsiella oxytoca NOT DETECTED NOT DETECTED Final   Klebsiella pneumoniae NOT DETECTED NOT DETECTED Final   Proteus species NOT DETECTED NOT DETECTED Final   Serratia marcescens NOT DETECTED NOT DETECTED Final   Haemophilus influenzae NOT DETECTED NOT DETECTED Final   Neisseria meningitidis NOT DETECTED NOT DETECTED Final   Pseudomonas aeruginosa NOT DETECTED NOT DETECTED Final   Candida albicans NOT DETECTED NOT DETECTED Final   Candida glabrata NOT DETECTED NOT DETECTED Final   Candida krusei NOT DETECTED NOT DETECTED Final   Candida parapsilosis NOT DETECTED NOT DETECTED Final   Candida tropicalis NOT DETECTED  NOT DETECTED Final    Comment: Performed at [facility], [address] [place], [place] [address]  MRSA PCR Screening     Status: None   Collection Time: 03/04/18 12:37 PM  Result Value Ref Range Status   MRSA by PCR NEGATIVE NEGATIVE Final    Comment:        The GeneXpert MRSA Assay (FDA approved for NASAL specimens only), is one component of a comprehensive MRSA colonization surveillance program. It is not intended to diagnose MRSA infection nor to guide or monitor treatment for MRSA infections. Performed at [facility], [address] [place],  [place] [address]   Culture, blood (Routine X 2) w Reflex to ID Panel     Status: None (Preliminary result)   Collection Time: 03/05/18  2:02 AM  Result Value Ref Range Status   Specimen Description BLOOD RIGHT HAND  Final   Special Requests   Final    BOTTLES DRAWN AEROBIC ONLY Blood Culture results may not be optimal due to an inadequate volume of blood received in culture bottles   Culture   Final    NO GROWTH 2 DAYS Performed at [facility], [address], [place], [place] [address]    Report Status PENDING  Incomplete  Culture, blood (Routine X 2) w Reflex to ID Panel     Status: None (Preliminary result)   Collection Time: 03/05/18  2:03 AM  Result Value Ref Range Status   Specimen Description BLOOD RIGHT HAND  Final   Special Requests   Final    BOTTLES DRAWN AEROBIC ONLY Blood Culture results may not be optimal due to an inadequate volume of blood received in culture bottles   Culture   Final    NO GROWTH 2 DAYS Performed at [facility], [address] [place], [place] [address]    Report Status PENDING  Incomplete    Coagulation Studies: No results for input(s): LABPROT, INR in the last 72 hours.  Urinalysis: Recent Labs    03/04/18 1102  COLORURINE YELLOW*  LABSPEC 1.008  PHURINE 7.0  GLUCOSEU NEGATIVE  HGBUR NEGATIVE  BILIRUBINUR NEGATIVE  KETONESUR NEGATIVE   PROTEINUR NEGATIVE  NITRITE NEGATIVE  LEUKOCYTESUR NEGATIVE      Imaging: Ct Head Wo Contrast  Result Date: 03/05/2018 CLINICAL DATA:  Patient found down yesterday. Possible drug overdose. EXAM: CT HEAD WITHOUT CONTRAST TECHNIQUE: Contiguous axial images were obtained from the base of the skull through the vertex without intravenous contrast. COMPARISON:  Head CT scan 09/22/2017.  Brain MRI 09/02/2017. FINDINGS: Brain: No evidence of acute infarction, hemorrhage, hydrocephalus, extra-axial collection or mass lesion/mass effect. Cortical atrophy is noted. Vascular: No hyperdense vessel or unexpected calcification. Skull: Intact.  No focal lesion. Sinuses/Orbits: No acute or focal abnormality. Status post lens extraction. Other: None. IMPRESSION: No acute abnormality. Cortical atrophy. Electronically Signed   By: Inge Rise M.D.   On: 03/05/2018 11:22   Mr Brain Wo Contrast  Result Date: 03/06/2018 CLINICAL DATA:  Initial evaluation for acute altered mental status, encephalopathy. EXAM: MRI HEAD WITHOUT CONTRAST TECHNIQUE: Multiplanar, multiecho pulse sequences of the brain and surrounding structures were obtained without intravenous contrast. COMPARISON:  Prior CT from 03/05/2018. FINDINGS: Brain: Generalized age-related cerebral atrophy. Few scattered T2/FLAIR hyperintensities noted within the periventricular deep white matter both cerebral hemispheres, nonspecific, but mild for age. No evidence for acute or subacute infarct. Gray-white matter differentiation maintained. No areas of remote cortical infarction. No acute or chronic intracranial hemorrhage. No mass lesion, midline shift or mass effect. No hydrocephalus. No extra-axial fluid collection. Normal pituitary gland. Vascular: Major intracranial vascular flow voids are maintained Skull and upper cervical spine: Craniocervical junction normal. Upper cervical spine normal. No focal marrow replacing lesion. Scalp soft tissues unremarkable.  Sinuses/Orbits: Globes and orbital soft tissues demonstrate no acute finding. Patient status post ocular lens replacement bilaterally. Paranasal sinuses largely clear. Fluid within the nasopharynx. Patient appears to be intubated. No significant mastoid effusion. Inner ear structures grossly normal. Other: None. IMPRESSION: 1. No acute intracranial abnormality. 2. Age-related cerebral atrophy with mild bile specific cerebral white matter changes. Electronically Signed  By: Benjamin  McClintock M.D.   On: 03/06/2018 22:46   Dg Chest Port 1 View  Result Date: 03/06/2018 CLINICAL DATA:  Ventilatory support, respiratory failure EXAM: PORTABLE CHEST 1 VIEW COMPARISON:  03/05/2018 FINDINGS: Endotracheal tube 2.9 cm above the carina. NG tube enters the stomach with the tip not visualized. Left axillary vascular stents again noted. Stable cardiomegaly without CHF or significant focal airspace process. No significant collapse or consolidation. No enlarging effusion or pneumothorax. Overall slight improvement in aeration. IMPRESSION: Stable support apparatus. Cardiomegaly without CHF Slight improved lung volumes. Electronically Signed   By: M.  Shick M.D.   On: 03/06/2018 09:35     Medications:   . cefTRIAXone (ROCEPHIN)  IV Stopped (03/07/18 0939)  . famotidine (PEPCID) IV Stopped (03/07/18 0938)  . feeding supplement (VITAL 1.5 CAL) Stopped (03/06/18 0755)  . phenylephrine (NEO-SYNEPHRINE) Adult infusion Stopped (03/04/18 1805)   . chlorhexidine gluconate (MEDLINE KIT)  15 mL Mouth Rinse BID  . Chlorhexidine Gluconate Cloth  6 each Topical Q0600  . [START ON 03/08/2018] epoetin (EPOGEN/PROCRIT) injection  4,000 Units Intravenous Q M,W,F-HD  . heparin  5,000 Units Subcutaneous Q8H  . mouth rinse  15 mL Mouth Rinse 10 times per day  . midodrine  5 mg Oral BID WC   acetaminophen **OR** acetaminophen, fentaNYL (SUBLIMAZE) injection, fentaNYL (SUBLIMAZE) injection, ipratropium-albuterol, midazolam,  ondansetron **OR** ondansetron (ZOFRAN) IV  Assessment/ Plan:  [name] is a 72 y.o. white female with end stage renal disease on hemodialysis, hypertension, bipolar disorder, GERD, polycystic kidney disease  [facility] MWF left AVF  1. End Stage Renal Disease   Next hemodialysis planned for Monday  2.  Acute Resp failure - Vent assisted at present -Not able to be extubated due to altered mental status  3. Anemia of chronic kidney disease: hemoglobin 10.7 - EPO with HD  4.  Altered mental status Cause unclear Having some spontaneous movements Supportive care  5.  Secondary hyperparathyroidism Monitor phosphorus       LOS: 3   8/18/201910:54 AM  

## 2018-03-07 NOTE — Progress Notes (Signed)
Wakes up very agitated and swinging her arms. Had to be medicated with Versed and Fentanyl this AM. MD aware that patient is not opening her eyes but swinging and blocking. CBG has been WNL this shift and afebrile. Continue to monitor.

## 2018-03-07 NOTE — Progress Notes (Signed)
Follow up - Critical Care Medicine Note  Patient Details:    Joyce [name] is an 71 y.o. female. with a past medical history Remarkable for bipolar disorder, chronic renal failure, on hemodialysis, Monday once a Friday,, who presented to [facility] ED on 03/04/18 after being found unresponsive by her mother. The patient was found with pills scattered around her in the am 8/15.  Family had last talked to the patient at 1700 on 03/03/18, of which she seemed in normal spirits.   At this time, it is unclear what pills she ingested, and whether this was an intentional or unintentional overdose. It is noted that she was discharged from [facility] Psychiatric ward yesterday 03/03/18.  Review of home medications indicates she takes Carbamazepine, Haloperidol, Temazepam, and Ropinirole  In the ED, she was intubated for airway protection.  Lines, Airways, Drains: Airway (Active)     Airway (Active)     Airway (Active)     Airway 7.5 mm (Active)  Secured at (cm) 24 cm 03/05/2018  7:19 AM  Measured From Lips 03/05/2018  7:19 AM  Secured Location Left 03/05/2018  7:19 AM  Secured By [employer] 03/05/2018  7:19 AM  Tube Holder Repositioned Yes 03/05/2018  4:10 AM  Cuff Pressure (cm H2O) 25 cm H2O 03/04/2018  7:25 PM  Site Condition Dry 03/05/2018  7:19 AM     NG/OG Tube Orogastric 14 Fr. Center mouth Aucultation Measured external length of tube (Active)  External Length of Tube (cm) - (if applicable) 55 cm 9/92/4268  4:00 PM  Site Assessment Clean;Dry 03/05/2018  4:10 AM  Ongoing Placement Verification No acute changes, not attributed to clinical condition;No change in respiratory status 03/04/2018  4:00 PM  Status Clamped 03/04/2018  7:40 PM  Amount of suction 100 mmHg 03/04/2018  4:00 PM  Drainage Appearance Green;Clear 03/04/2018  4:00 PM  Output (mL) 30 mL 03/05/2018  5:00 AM     Urethral Catheter Cassie, RN 14 Fr. (Active)  Indication for Insertion or Continuance of Catheter Unstable critical patients  (first 24-48 hours) 03/05/2018  4:10 AM  Site Assessment Clean;Intact 03/05/2018  4:10 AM  Catheter Maintenance Bag below level of bladder;Drainage bag/tubing not touching floor;Catheter secured;No dependent loops;Seal intact 03/05/2018  4:10 AM  Collection Container Standard drainage bag 03/05/2018  4:10 AM  Securement Method Securing device (Describe) 03/05/2018  4:10 AM  Urinary Catheter Interventions Unclamped 03/05/2018  4:10 AM  Output (mL) 175 mL 03/04/2018  6:05 PM    Anti-infectives:  Anti-infectives (From admission, onward)   Start     Dose/Rate Route Frequency Ordered Stop   03/05/18 0900  cefTRIAXone (ROCEPHIN) 2 g in sodium chloride 0.9 % 100 mL IVPB     2 g 200 mL/hr over 30 Minutes Intravenous Daily 03/05/18 0849     03/05/18 0830  vancomycin (VANCOCIN) IVPB 1000 mg/200 mL premix  Status:  Discontinued     1,000 mg 200 mL/hr over 60 Minutes Intravenous  Once 03/05/18 3419 03/05/18 0849      Microbiology: Results for orders placed or performed during the hospital encounter of 03/04/18  Urine culture     Status: None   Collection Time: 03/04/18 11:03 AM  Result Value Ref Range Status   Specimen Description   Final    URINE, RANDOM Performed at [facility], [address], [place], [place] [address]    Special Requests   Final    NONE Performed at [facility], [address] [place], [place]  [address]    Culture   Final    NO GROWTH Performed at [facility], [place] [address], [place], [place] [address]    Report Status 03/06/2018 FINAL  Final  Blood Cultures (routine x 2)     Status: Abnormal (Preliminary result)   Collection Time: 03/04/18 11:18 AM  Result Value Ref Range Status   Specimen Description   Final    BLOOD RIGHT ARM Performed at [facility], [place] [address], [place], [place] [address]    Special Requests   Final    BOTTLES DRAWN AEROBIC AND ANAEROBIC Blood Culture adequate volume Performed at [facility],  [address] [place], [place] [address]    Culture  Setup Time   Final    GRAM POSITIVE COCCI AEROBIC BOTTLE ONLY CRITICAL RESULT CALLED TO, READ BACK BY AND VERIFIED WITH: DAVID BESANTI ON 03/05/18 AT 0109 JAG Performed at [facility], [place] [address] [place], [place] [address]    Culture VIRIDANS STREPTOCOCCUS (A)  Final   Report Status PENDING  Incomplete  Blood Culture ID Panel (Reflexed)     Status: Abnormal   Collection Time: 03/04/18 11:18 AM  Result Value Ref Range Status   Enterococcus species NOT DETECTED NOT DETECTED Final   Listeria monocytogenes NOT DETECTED NOT DETECTED Final   Staphylococcus species NOT DETECTED NOT DETECTED Final   Staphylococcus aureus NOT DETECTED NOT DETECTED Final   Streptococcus species DETECTED (A) NOT DETECTED Final    Comment: Not Enterococcus species, Streptococcus agalactiae, Streptococcus pyogenes, or Streptococcus pneumoniae. CRITICAL RESULT CALLED TO, READ BACK BY AND VERIFIED WITH: DAVID BESANTI ON 03/05/18 AT 0109 BY JAG    Streptococcus agalactiae NOT DETECTED NOT DETECTED Final   Streptococcus pneumoniae NOT DETECTED NOT DETECTED Final   Streptococcus pyogenes NOT DETECTED NOT DETECTED Final   Acinetobacter baumannii NOT DETECTED NOT DETECTED Final   Enterobacteriaceae species NOT DETECTED NOT DETECTED Final   Enterobacter cloacae complex NOT DETECTED NOT DETECTED Final   Escherichia coli NOT DETECTED NOT DETECTED Final   Klebsiella oxytoca NOT DETECTED NOT DETECTED Final   Klebsiella pneumoniae NOT DETECTED NOT DETECTED Final   Proteus species NOT DETECTED NOT DETECTED Final   Serratia marcescens NOT DETECTED NOT DETECTED Final   Haemophilus influenzae NOT DETECTED NOT DETECTED Final   Neisseria meningitidis NOT DETECTED NOT DETECTED Final   Pseudomonas aeruginosa NOT DETECTED NOT DETECTED Final   Candida albicans NOT DETECTED NOT DETECTED Final   Candida glabrata NOT DETECTED NOT DETECTED Final   Candida krusei NOT  DETECTED NOT DETECTED Final   Candida parapsilosis NOT DETECTED NOT DETECTED Final   Candida tropicalis NOT DETECTED NOT DETECTED Final    Comment: Performed at [facility], [address] [place], [place] [address]  MRSA PCR Screening     Status: None   Collection Time: 03/04/18 12:37 PM  Result Value Ref Range Status   MRSA by PCR NEGATIVE NEGATIVE Final    Comment:        The GeneXpert MRSA Assay (FDA approved for NASAL specimens only), is one component of a comprehensive MRSA colonization surveillance program. It is not intended to diagnose MRSA infection nor to guide or monitor treatment for MRSA infections. Performed at [facility], [address] [place], [place] [address]   Culture, blood (Routine X 2) w Reflex to ID Panel     Status: None (Preliminary result)   Collection Time: 03/05/18  2:02 AM  Result Value  Ref Range Status   Specimen Description BLOOD RIGHT HAND  Final   Special Requests   Final    BOTTLES DRAWN AEROBIC ONLY Blood Culture results may not be optimal due to an inadequate volume of blood received in culture bottles   Culture   Final    NO GROWTH 2 DAYS Performed at [facility], [address], [place], [place] [address]    Report Status PENDING  Incomplete  Culture, blood (Routine X 2) w Reflex to ID Panel     Status: None (Preliminary result)   Collection Time: 03/05/18  2:03 AM  Result Value Ref Range Status   Specimen Description BLOOD RIGHT HAND  Final   Special Requests   Final    BOTTLES DRAWN AEROBIC ONLY Blood Culture results may not be optimal due to an inadequate volume of blood received in culture bottles   Culture   Final    NO GROWTH 2 DAYS Performed at [facility], [address] [place], [place] [address]    Report Status PENDING  Incomplete   Studies: Dg Abdomen 1 View  Result Date: 03/04/2018 CLINICAL DATA:  OG tube advancement. EXAM: ABDOMEN - 1 VIEW COMPARISON:  CT scan March 11, 2017.  Chest x-ray March 04, 2018. FINDINGS: The side port of the OG tube is near the GE junction with the tip in the fundus. The patient may benefit from advancing the tube another 8 cm or so. Cholecystectomy clips. Rounded opacities project over the right upper quadrant, nonspecific. No other acute abnormalities. IMPRESSION: 1. The side port of the OG tube is probably just above the GE junction. The distal tip is within the stomach. Consider advancing up to 8 cm. 2. Rounded radiopaque densities project over the right upper quadrant and could be something on the patient. The patient is status post cholecystectomy which would exclude gallstones. No stones were seen in the right kidney on the previous CT scan. Recommend clinical correlation. Electronically Signed   By: Dorise Bullion III M.D   On: 03/04/2018 11:47   Ct Head Wo Contrast  Result Date: 03/05/2018 CLINICAL DATA:  Patient found down yesterday. Possible drug overdose. EXAM: CT HEAD WITHOUT CONTRAST TECHNIQUE: Contiguous axial images were obtained from the base of the skull through the vertex without intravenous contrast. COMPARISON:  Head CT scan 09/22/2017.  Brain MRI 09/02/2017. FINDINGS: Brain: No evidence of acute infarction, hemorrhage, hydrocephalus, extra-axial collection or mass lesion/mass effect. Cortical atrophy is noted. Vascular: No hyperdense vessel or unexpected calcification. Skull: Intact.  No focal lesion. Sinuses/Orbits: No acute or focal abnormality. Status post lens extraction. Other: None. IMPRESSION: No acute abnormality. Cortical atrophy. Electronically Signed   By: Inge Rise M.D.   On: 03/05/2018 11:22   Mr Brain Wo Contrast  Result Date: 03/06/2018 CLINICAL DATA:  Initial evaluation for acute altered mental status, encephalopathy. EXAM: MRI HEAD WITHOUT CONTRAST TECHNIQUE: Multiplanar, multiecho pulse sequences of the brain and surrounding structures were obtained without intravenous contrast. COMPARISON:  Prior  CT from 03/05/2018. FINDINGS: Brain: Generalized age-related cerebral atrophy. Few scattered T2/FLAIR hyperintensities noted within the periventricular deep white matter both cerebral hemispheres, nonspecific, but mild for age. No evidence for acute or subacute infarct. Gray-white matter differentiation maintained. No areas of remote cortical infarction. No acute or chronic intracranial hemorrhage. No mass lesion, midline shift or mass effect. No hydrocephalus. No extra-axial fluid collection. Normal pituitary gland. Vascular: Major intracranial vascular flow voids are maintained Skull and upper cervical spine: Craniocervical junction  normal. Upper cervical spine normal. No focal marrow replacing lesion. Scalp soft tissues unremarkable. Sinuses/Orbits: Globes and orbital soft tissues demonstrate no acute finding. Patient status post ocular lens replacement bilaterally. Paranasal sinuses largely clear. Fluid within the nasopharynx. Patient appears to be intubated. No significant mastoid effusion. Inner ear structures grossly normal. Other: None. IMPRESSION: 1. No acute intracranial abnormality. 2. Age-related cerebral atrophy with mild bile specific cerebral white matter changes. Electronically Signed   By: Jeannine Boga M.D.   On: 03/06/2018 22:46   Dg Chest Port 1 View  Result Date: 03/06/2018 CLINICAL DATA:  Ventilatory support, respiratory failure EXAM: PORTABLE CHEST 1 VIEW COMPARISON:  03/05/2018 FINDINGS: Endotracheal tube 2.9 cm above the carina. NG tube enters the stomach with the tip not visualized. Left axillary vascular stents again noted. Stable cardiomegaly without CHF or significant focal airspace process. No significant collapse or consolidation. No enlarging effusion or pneumothorax. Overall slight improvement in aeration. IMPRESSION: Stable support apparatus. Cardiomegaly without CHF Slight improved lung volumes. Electronically Signed   By: Jerilynn Mages.  Shick M.D.   On: 03/06/2018 09:35   Dg  Chest Port 1 View  Result Date: 03/05/2018 CLINICAL DATA:  Respiratory failure EXAM: PORTABLE CHEST 1 VIEW COMPARISON:  03/04/2018 FINDINGS: Cardiac shadow is mildly enlarged but stable. Nasogastric catheter is noted within the stomach. Endotracheal tube is noted at the level of the carina. This should be withdrawn 2-3 cm. The lungs are clear. Vascular stent is noted on the left and stable. IMPRESSION: Endotracheal tube at the level of the carina. This should be withdrawn 2-3 cm. No other focal abnormality is noted. Electronically Signed   By: Inez Catalina M.D.   On: 03/05/2018 03:19   Dg Chest Port 1 View  Result Date: 03/04/2018 CLINICAL DATA:  Intubated. EXAM: PORTABLE CHEST 1 VIEW COMPARISON:  12/15/2017. FINDINGS: Interval endotracheal tube with its tip 2 cm above the carina. This could be retracted 2 cm to place it at the level of the clavicles. Interval nasogastric or orogastric tube with its tip at the gastroesophageal junction and side hole in the distal esophagus. Poor inspiration with stable borderline enlarged cardiac silhouette and clear lungs. Cholecystectomy clips. Left subclavian and axillary stent. Unremarkable bones. IMPRESSION: 1. Endotracheal tube tip 2 cm above the carina. This could be retracted 2 cm to place it at the level of the clavicles. 2. Nasogastric tube tip at the gastroesophageal junction and side hole in the distal esophagus. It is recommended that this be advanced. 3. Stable borderline cardiomegaly. Electronically Signed   By: Claudie Revering M.D.   On: 03/04/2018 11:44    Consults: Treatment Team:  Wilhelmina Mcardle, MD [name], MD Alexis Goodell, MD   Subjective:    Overnight Issues: no significant changes overnight. Postures to pain  Objective:  Vital signs for last 24 hours: Temp:  [98 F (36.7 C)-98.5 F (36.9 C)] 98.5 F (36.9 C) (08/18 0400) Pulse Rate:  [84-105] 105 (08/18 0600) Resp:  [12-20] 12 (08/18 0600) BP: (122-177)/(51-129) 170/65  (08/18 0600) SpO2:  [92 %-100 %] 96 % (08/18 0600) FiO2 (%):  [24 %-30 %] 24 % (08/18 0600) Weight:  [67.2 kg] 67.2 kg (08/18 0421)  Hemodynamic parameters for last 24 hours:    Intake/Output from previous day: 08/17 0701 - 08/18 0700 In: 339.2 [NG/GT:139.2; IV Piggyback:200] Out: 615 [Urine:615]  Intake/Output this shift: No intake/output data recorded.  Vent settings for last 24 hours: Vent Mode: PRVC FiO2 (%):  [24 %-30 %] 24 % Set  Rate:  [12 bmp] 12 bmp Vt Set:  [400 mL] 400 mL PEEP:  [5 cmH20] 5 cmH20 Plateau Pressure:  [15 cmH20-19 cmH20] 19 cmH20  Physical Exam:   General:  Acutely ill appearing female, laying in bed, intubated, in NAD Neuro:  Unresponsive, no sedation, Pupils PERRL 2 mm sluggish bilaterally HEENT:  Atraumatic, normocephalic, neck supple, ETT in place, No JVD Cardiovascular:  RRR, s1s2, 3/6 murmur Lungs:  Clear bilaterally, no wheezing, even, non-labored, vent assisted Abdomen:  Soft, non-tender, non-distended, BS+ x4 Musculoskeletal:  No deformities, no edema Neurologic: Patient does posture with her right to pain, positive gag, positive breathing over the ventilator   Assessment/Plan:   72 y.o. Female found unresponsive 03/04/18 in setting of possible drug overdose requiring intubation for airway protection.  Unclear as to what meds she took, and whether this was intentional vs unintentional overdose.  Has had a negative head CT, pending the results of EEG, MRI performed last night negative study, appreciate neurology's input  Respiratory failure. Intubated for airway protection.We'll continue mechanical ventilation until mental status improves. Chest x-ray this morning pending.   End-stage renal disease on hemodialysis Monday Wednesday Friday. Nephrology has been consulted. BUN 22/creatinine 4.26  Positive blood culture. Gram-positive cocci strep species. On rocephin. Patient grew viridans, will obtain echocardiogram tomorrow  Critical Care  Total Time 35  [place] 03/07/2018 Patient ID: Ivonne Andrew, female   DOB: 04-15-1946, 72 y.o.   MRN: [id number] Patient ID: Marjo Grosvenor, female   DOB: 1946-07-18, 72 y.o.   MRN: [id number]

## 2018-03-08 DIAGNOSIS — T50902A Poisoning by unspecified drugs, medicaments and biological substances, intentional self-harm, initial encounter: Secondary | ICD-10-CM

## 2018-03-08 DIAGNOSIS — Z885 Allergy status to narcotic agent status: Secondary | ICD-10-CM

## 2018-03-08 DIAGNOSIS — F319 Bipolar disorder, unspecified: Secondary | ICD-10-CM

## 2018-03-08 DIAGNOSIS — Z91048 Other nonmedicinal substance allergy status: Secondary | ICD-10-CM

## 2018-03-08 DIAGNOSIS — Z888 Allergy status to other drugs, medicaments and biological substances status: Secondary | ICD-10-CM

## 2018-03-08 DIAGNOSIS — B954 Other streptococcus as the cause of diseases classified elsewhere: Secondary | ICD-10-CM

## 2018-03-08 DIAGNOSIS — N186 End stage renal disease: Secondary | ICD-10-CM

## 2018-03-08 DIAGNOSIS — T1491XA Suicide attempt, initial encounter: Secondary | ICD-10-CM

## 2018-03-08 DIAGNOSIS — Z992 Dependence on renal dialysis: Secondary | ICD-10-CM

## 2018-03-08 DIAGNOSIS — Z79899 Other long term (current) drug therapy: Secondary | ICD-10-CM

## 2018-03-08 DIAGNOSIS — R7881 Bacteremia: Secondary | ICD-10-CM

## 2018-03-08 DIAGNOSIS — I517 Cardiomegaly: Secondary | ICD-10-CM

## 2018-03-08 LAB — BASIC METABOLIC PANEL
Anion gap: 14 (ref 5–15)
BUN: 70 mg/dL — AB (ref 8–23)
CALCIUM: 8.9 mg/dL (ref 8.9–10.3)
CO2: 26 mmol/L (ref 22–32)
Chloride: 106 mmol/L (ref 98–111)
Creatinine, Ser: 5.42 mg/dL — ABNORMAL HIGH (ref 0.44–1.00)
GFR calc Af Amer: 8 mL/min — ABNORMAL LOW (ref >60)
GFR, EST NON AFRICAN AMERICAN: 7 mL/min — AB (ref >60)
GLUCOSE: 103 mg/dL — AB (ref 70–99)
Potassium: 3.8 mmol/L (ref 3.5–5.1)
Sodium: 146 mmol/L — ABNORMAL HIGH (ref 135–145)

## 2018-03-08 LAB — GLUCOSE, CAPILLARY
GLUCOSE-CAPILLARY: 94 mg/dL (ref 70–99)
GLUCOSE-CAPILLARY: 110 mg/dL — AB (ref 70–99)
Glucose-Capillary: 82 mg/dL (ref 70–99)
Glucose-Capillary: 83 mg/dL (ref 70–99)
Glucose-Capillary: 91 mg/dL (ref 70–99)
Glucose-Capillary: 95 mg/dL (ref 70–99)
Glucose-Capillary: 99 mg/dL (ref 70–99)

## 2018-03-08 LAB — TROPONIN I: TROPONIN I: 0.06 ng/mL — AB (ref <0.03)

## 2018-03-08 LAB — PHOSPHORUS: Phosphorus: 4.6 mg/dL (ref 2.5–4.6)

## 2018-03-08 LAB — MAGNESIUM: Magnesium: 2.9 mg/dL — ABNORMAL HIGH (ref 1.7–2.4)

## 2018-03-08 MED ORDER — PRO-STAT SUGAR FREE PO LIQD: 30.0000 mL | Freq: Two times a day (BID) | ORAL | Status: DC

## 2018-03-08 MED ORDER — PENTAFLUOROPROP-TETRAFLUOROETH EX AERO
INHALATION_SPRAY | CUTANEOUS | Status: DC | PRN
  Administered 2018-03-08: 12:00:00 via TOPICAL
  Filled 2018-03-08 (×2): qty 30

## 2018-03-08 MED ORDER — HYDRALAZINE HCL 20 MG/ML IJ SOLN
10.0000 mg | INTRAMUSCULAR | Status: DC | PRN
  Administered 2018-03-08 – 2018-03-09 (×2): 10 mg via INTRAVENOUS
  Filled 2018-03-08 (×2): qty 1

## 2018-03-08 MED ORDER — DEXTROSE 5 % IV SOLN
INTRAVENOUS | Status: DC
  Administered 2018-03-08: 22:00:00 via INTRAVENOUS

## 2018-03-08 MED ORDER — VITAL 1.5 CAL PO LIQD: 1000.0000 mL | ORAL | Status: DC

## 2018-03-08 MED ORDER — FENTANYL CITRATE (PF) 100 MCG/2ML IJ SOLN
50.0000 ug | Freq: Once | INTRAMUSCULAR | Status: AC
  Administered 2018-03-08: 50 ug via INTRAVENOUS

## 2018-03-08 MED ORDER — DEXMEDETOMIDINE HCL IN NACL 400 MCG/100ML IV SOLN
0.0000 ug/kg/h | INTRAVENOUS | Status: DC
  Administered 2018-03-08: 0.4 ug/kg/h via INTRAVENOUS
  Administered 2018-03-08: 0.7 ug/kg/h via INTRAVENOUS
  Filled 2018-03-08 (×2): qty 100

## 2018-03-08 NOTE — Progress Notes (Signed)
Due to not knowing if patient attempted to harm self prior to admission safety sitter will be at bedside and psychiatrist will be notified patient has been extubated.

## 2018-03-08 NOTE — Progress Notes (Signed)
ID Full consult note to follow Pt seen- awake, restless, responds to questions In hospital since 8/15 - brought in unresponsive with possible OD - ( carbamazepine, haloperidol, temazepam and ropinirole) Intubated on admission Extubated today. Blood culture 1 bottle ( only aerobic) was positive for Strep Viridans from8/15 8/16 BC ( before start of antibitoic) was negative Pt has ESRD and on dialysis, She was in psych floor on 8/14 and was discharged that day after 5 day stay there for manic behavior. Pt was hypothermic/hypotensive during this hospitalization Currently on  Ceftriaxone. Strep viridans in blood could be a contaminant VS true pathogen. With just one culture being positive and with no h/o fever or other prodromal features makes infection less likely-  But she gets dialysis and has poor dentition. 2 d echo done today shows normal valves.low to nil  suspicion for endocarditis-  Will talk to her tomorrow when she may be able to give a history. Continue ceftriaxone for now.

## 2018-03-08 NOTE — Progress Notes (Signed)
Patient extubated and placed on 2L nasal cannula.  Patient with moderate cough and able to state name.  Precedex started for mild agitation per Dr. Alva Garnet request.

## 2018-03-08 NOTE — Progress Notes (Signed)
Follow up - Critical Care Medicine Note  Patient Details:    Joyce [name] is an 72 y.o. female. with a past medical history Remarkable for bipolar disorder, chronic renal failure, on hemodialysis, Monday once a Friday,, who presented to [facility] ED on 03/04/18 after being found unresponsive by her mother. The patient was found with pills scattered around her in the am 8/15.  Family had last talked to the patient at 1700 on 03/03/18, of which she seemed in normal spirits.   At this time, it is unclear what pills she ingested, and whether this was an intentional or unintentional overdose. It is noted that she was discharged from [facility] yesterday 03/03/18.  Review of home medications indicates she takes Carbamazepine, Haloperidol, Temazepam, and Ropinirole  In the ED, she was intubated for airway protection.  Lines, Airways, Drains: Airway (Active)     Airway (Active)     Airway (Active)     Airway 7.5 mm (Active)  Secured at (cm) 24 cm 03/05/2018  7:19 AM  Measured From Lips 03/05/2018  7:19 AM  Secured Location Left 03/05/2018  7:19 AM  Secured By [employer] 03/05/2018  7:19 AM  Tube Holder Repositioned Yes 03/05/2018  4:10 AM  Cuff Pressure (cm H2O) 25 cm H2O 03/04/2018  7:25 PM  Site Condition Dry 03/05/2018  7:19 AM     NG/OG Tube Orogastric 14 Fr. Center mouth Aucultation Measured external length of tube (Active)  External Length of Tube (cm) - (if applicable) 55 cm 7/34/1937  4:00 PM  Site Assessment Clean;Dry 03/05/2018  4:10 AM  Ongoing Placement Verification No acute changes, not attributed to clinical condition;No change in respiratory status 03/04/2018  4:00 PM  Status Clamped 03/04/2018  7:40 PM  Amount of suction 100 mmHg 03/04/2018  4:00 PM  Drainage Appearance Green;Clear 03/04/2018  4:00 PM  Output (mL) 30 mL 03/05/2018  5:00 AM     Urethral Catheter Cassie, RN 14 Fr. (Active)  Indication for Insertion or Continuance of Catheter Unstable critical patients  (first 24-48 hours) 03/05/2018  4:10 AM  Site Assessment Clean;Intact 03/05/2018  4:10 AM  Catheter Maintenance Bag below level of bladder;Drainage bag/tubing not touching floor;Catheter secured;No dependent loops;Seal intact 03/05/2018  4:10 AM  Collection Container Standard drainage bag 03/05/2018  4:10 AM  Securement Method Securing device (Describe) 03/05/2018  4:10 AM  Urinary Catheter Interventions Unclamped 03/05/2018  4:10 AM  Output (mL) 175 mL 03/04/2018  6:05 PM    Anti-infectives:  Anti-infectives (From admission, onward)   Start     Dose/Rate Route Frequency Ordered Stop   03/05/18 0900  cefTRIAXone (ROCEPHIN) 2 g in sodium chloride 0.9 % 100 mL IVPB     2 g 200 mL/hr over 30 Minutes Intravenous Daily 03/05/18 0849     03/05/18 0830  vancomycin (VANCOCIN) IVPB 1000 mg/200 mL premix  Status:  Discontinued     1,000 mg 200 mL/hr over 60 Minutes Intravenous  Once 03/05/18 9024 03/05/18 0849      Microbiology: Results for orders placed or performed during the hospital encounter of 03/04/18  Urine culture     Status: None   Collection Time: 03/04/18 11:03 AM  Result Value Ref Range Status   Specimen Description   Final    URINE, RANDOM Performed at [facility], [address] [place], [place] [address]    Special Requests   Final    NONE Performed at [facility], [address] [place], [place]  [address]    Culture   Final    NO GROWTH Performed at [facility], [place] [address] [place], [place] [address]    Report Status 03/06/2018 FINAL  Final  Blood Cultures (routine x 2)     Status: Abnormal   Collection Time: 03/04/18 11:18 AM  Result Value Ref Range Status   Specimen Description   Final    BLOOD RIGHT ARM Performed at [facility], [place] [address], [place], [place] [address]    Special Requests   Final    BOTTLES DRAWN AEROBIC AND ANAEROBIC Blood Culture adequate volume Performed at [facility], [address]  [place], [place] [address]    Culture  Setup Time   Final    GRAM POSITIVE COCCI AEROBIC BOTTLE ONLY CRITICAL RESULT CALLED TO, READ BACK BY AND VERIFIED WITH: DAVID BESANTI ON 03/05/18 AT 0109 JAG    Culture (A)  Final    VIRIDANS STREPTOCOCCUS THE SIGNIFICANCE OF ISOLATING THIS ORGANISM FROM A SINGLE SET OF BLOOD CULTURES WHEN MULTIPLE SETS ARE DRAWN IS UNCERTAIN. PLEASE NOTIFY THE MICROBIOLOGY DEPARTMENT WITHIN ONE WEEK IF SPECIATION AND SENSITIVITIES ARE REQUIRED. Performed at [facility], [place] [address] [place], [place] [address]    Report Status 03/07/2018 FINAL  Final  Blood Culture ID Panel (Reflexed)     Status: Abnormal   Collection Time: 03/04/18 11:18 AM  Result Value Ref Range Status   Enterococcus species NOT DETECTED NOT DETECTED Final   Listeria monocytogenes NOT DETECTED NOT DETECTED Final   Staphylococcus species NOT DETECTED NOT DETECTED Final   Staphylococcus aureus NOT DETECTED NOT DETECTED Final   Streptococcus species DETECTED (A) NOT DETECTED Final    Comment: Not Enterococcus species, Streptococcus agalactiae, Streptococcus pyogenes, or Streptococcus pneumoniae. CRITICAL RESULT CALLED TO, READ BACK BY AND VERIFIED WITH: DAVID BESANTI ON 03/05/18 AT 0109 BY JAG    Streptococcus agalactiae NOT DETECTED NOT DETECTED Final   Streptococcus pneumoniae NOT DETECTED NOT DETECTED Final   Streptococcus pyogenes NOT DETECTED NOT DETECTED Final   Acinetobacter baumannii NOT DETECTED NOT DETECTED Final   Enterobacteriaceae species NOT DETECTED NOT DETECTED Final   Enterobacter cloacae complex NOT DETECTED NOT DETECTED Final   Escherichia coli NOT DETECTED NOT DETECTED Final   Klebsiella oxytoca NOT DETECTED NOT DETECTED Final   Klebsiella pneumoniae NOT DETECTED NOT DETECTED Final   Proteus species NOT DETECTED NOT DETECTED Final   Serratia marcescens NOT DETECTED NOT DETECTED Final   Haemophilus influenzae NOT DETECTED NOT DETECTED Final   Neisseria meningitidis  NOT DETECTED NOT DETECTED Final   Pseudomonas aeruginosa NOT DETECTED NOT DETECTED Final   Candida albicans NOT DETECTED NOT DETECTED Final   Candida glabrata NOT DETECTED NOT DETECTED Final   Candida krusei NOT DETECTED NOT DETECTED Final   Candida parapsilosis NOT DETECTED NOT DETECTED Final   Candida tropicalis NOT DETECTED NOT DETECTED Final    Comment: Performed at [facility], [address] [place], [place] [address]  MRSA PCR Screening     Status: None   Collection Time: 03/04/18 12:37 PM  Result Value Ref Range Status   MRSA by PCR NEGATIVE NEGATIVE Final    Comment:        The GeneXpert MRSA Assay (FDA approved for NASAL specimens only), is one component of a comprehensive MRSA colonization surveillance program. It is not intended to diagnose MRSA infection nor to guide or monitor treatment for MRSA infections. Performed at [facility], [phone] [place], [place] [address]   Culture, blood (Routine X 2) w Reflex to ID Panel     Status: None (Preliminary result)   Collection Time: 03/05/18  2:02 AM  Result Value Ref Range Status   Specimen Description BLOOD RIGHT HAND  Final   Special Requests   Final    BOTTLES DRAWN AEROBIC ONLY Blood Culture results may not be optimal due to an inadequate volume of blood received in culture bottles   Culture   Final    NO GROWTH 2 DAYS Performed at [facility], [address] [place], [place] [address]    Report Status PENDING  Incomplete  Culture, blood (Routine X 2) w Reflex to ID Panel     Status: None (Preliminary result)   Collection Time: 03/05/18  2:03 AM  Result Value Ref Range Status   Specimen Description BLOOD RIGHT HAND  Final   Special Requests   Final    BOTTLES DRAWN AEROBIC ONLY Blood Culture results may not be optimal due to an inadequate volume of blood received in culture bottles   Culture   Final    NO GROWTH 2 DAYS Performed at [facility], [address] [place], [place] [address]    Report Status PENDING  Incomplete   Studies: Dg Abdomen 1 View  Result Date: 03/04/2018 CLINICAL DATA:  OG tube advancement. EXAM: ABDOMEN - 1 VIEW COMPARISON:  CT scan March 11, 2017.  Chest x-ray March 04, 2018. FINDINGS: The side port of the OG tube is near the GE junction with the tip in the fundus. The patient may benefit from advancing the tube another 8 cm or so. Cholecystectomy clips. Rounded opacities project over the right upper quadrant, nonspecific. No other acute abnormalities. IMPRESSION: 1. The side port of the OG tube is probably just above the GE junction. The distal tip is within the stomach. Consider advancing up to 8 cm. 2. Rounded radiopaque densities project over the right upper quadrant and could be something on the patient. The patient is status post cholecystectomy which would exclude gallstones. No stones were seen in the right kidney on the previous CT scan. Recommend clinical correlation. Electronically Signed   By: Dorise Bullion III M.D   On: 03/04/2018 11:47   Ct Head Wo Contrast  Result Date: 03/05/2018 CLINICAL DATA:  Patient found down yesterday. Possible drug overdose. EXAM: CT HEAD WITHOUT CONTRAST TECHNIQUE: Contiguous axial images were obtained from the base of the skull through the vertex without intravenous contrast. COMPARISON:  Head CT scan 09/22/2017.  Brain MRI 09/02/2017. FINDINGS: Brain: No evidence of acute infarction, hemorrhage, hydrocephalus, extra-axial collection or mass lesion/mass effect. Cortical atrophy is noted. Vascular: No hyperdense vessel or unexpected calcification. Skull: Intact.  No focal lesion. Sinuses/Orbits: No acute or focal abnormality. Status post lens extraction. Other: None. IMPRESSION: No acute abnormality. Cortical atrophy. Electronically Signed   By: Inge Rise M.D.   On: 03/05/2018 11:22   Mr Brain Wo Contrast  Result Date: 03/06/2018 CLINICAL DATA:  Initial evaluation for acute altered  mental status, encephalopathy. EXAM: MRI HEAD WITHOUT CONTRAST TECHNIQUE: Multiplanar, multiecho pulse sequences of the brain and surrounding structures were obtained without intravenous contrast. COMPARISON:  Prior CT from 03/05/2018. FINDINGS: Brain: Generalized age-related cerebral atrophy. Few scattered T2/FLAIR hyperintensities noted within the periventricular deep white matter both cerebral hemispheres, nonspecific, but mild for age. No evidence for acute or subacute infarct. Gray-white matter differentiation maintained. No areas of remote cortical infarction. No  acute or chronic intracranial hemorrhage. No mass lesion, midline shift or mass effect. No hydrocephalus. No extra-axial fluid collection. Normal pituitary gland. Vascular: Major intracranial vascular flow voids are maintained Skull and upper cervical spine: Craniocervical junction normal. Upper cervical spine normal. No focal marrow replacing lesion. Scalp soft tissues unremarkable. Sinuses/Orbits: Globes and orbital soft tissues demonstrate no acute finding. Patient status post ocular lens replacement bilaterally. Paranasal sinuses largely clear. Fluid within the nasopharynx. Patient appears to be intubated. No significant mastoid effusion. Inner ear structures grossly normal. Other: None. IMPRESSION: 1. No acute intracranial abnormality. 2. Age-related cerebral atrophy with mild bile specific cerebral white matter changes. Electronically Signed   By: Jeannine Boga M.D.   On: 03/06/2018 22:46   Dg Chest Port 1 View  Result Date: 03/07/2018 CLINICAL DATA:  Ventilated patient. EXAM: PORTABLE CHEST 1 VIEW COMPARISON:  1 day prior FINDINGS: Endotracheal tube terminates 2.5 cm above carina.Nasogastric tube extends beyond the inferior aspect of the film. Numerous leads and wires project over the chest. Patient rotated left. Mild cardiomegaly. Atherosclerosis in the transverse aorta. Possible small left pleural effusion. No pneumothorax. No  congestive failure. Persistent left hemidiaphragm elevation with left base atelectasis. Left axillary stent. IMPRESSION: No significant change since one day prior. Cardiomegaly with low lung volumes and left base atelectasis. Possible small left pleural effusion. Electronically Signed   By: Abigail Miyamoto M.D.   On: 03/07/2018 11:10   Dg Chest Port 1 View  Result Date: 03/06/2018 CLINICAL DATA:  Ventilatory support, respiratory failure EXAM: PORTABLE CHEST 1 VIEW COMPARISON:  03/05/2018 FINDINGS: Endotracheal tube 2.9 cm above the carina. NG tube enters the stomach with the tip not visualized. Left axillary vascular stents again noted. Stable cardiomegaly without CHF or significant focal airspace process. No significant collapse or consolidation. No enlarging effusion or pneumothorax. Overall slight improvement in aeration. IMPRESSION: Stable support apparatus. Cardiomegaly without CHF Slight improved lung volumes. Electronically Signed   By: Jerilynn Mages.  Shick M.D.   On: 03/06/2018 09:35   Dg Chest Port 1 View  Result Date: 03/05/2018 CLINICAL DATA:  Respiratory failure EXAM: PORTABLE CHEST 1 VIEW COMPARISON:  03/04/2018 FINDINGS: Cardiac shadow is mildly enlarged but stable. Nasogastric catheter is noted within the stomach. Endotracheal tube is noted at the level of the carina. This should be withdrawn 2-3 cm. The lungs are clear. Vascular stent is noted on the left and stable. IMPRESSION: Endotracheal tube at the level of the carina. This should be withdrawn 2-3 cm. No other focal abnormality is noted. Electronically Signed   By: Inez Catalina M.D.   On: 03/05/2018 03:19   Dg Chest Port 1 View  Result Date: 03/04/2018 CLINICAL DATA:  Intubated. EXAM: PORTABLE CHEST 1 VIEW COMPARISON:  12/15/2017. FINDINGS: Interval endotracheal tube with its tip 2 cm above the carina. This could be retracted 2 cm to place it at the level of the clavicles. Interval nasogastric or orogastric tube with its tip at the  gastroesophageal junction and side hole in the distal esophagus. Poor inspiration with stable borderline enlarged cardiac silhouette and clear lungs. Cholecystectomy clips. Left subclavian and axillary stent. Unremarkable bones. IMPRESSION: 1. Endotracheal tube tip 2 cm above the carina. This could be retracted 2 cm to place it at the level of the clavicles. 2. Nasogastric tube tip at the gastroesophageal junction and side hole in the distal esophagus. It is recommended that this be advanced. 3. Stable borderline cardiomegaly. Electronically Signed   By: Claudie Revering M.D.   On: 03/04/2018 11:44  Consults: Treatment Team:  Wilhelmina Mcardle, MD [name], MD Alexis Goodell, MD   Subjective:    Overnight Issues: patient is more responsive however does not do anything purposely will respond to commands  Objective:  Vital signs for last 24 hours: Temp:  [98.4 F (36.9 C)-100 F (37.8 C)] 99.3 F (37.4 C) (08/19 0400) Pulse Rate:  [94-113] 107 (08/19 0600) Resp:  [10-21] 14 (08/19 0600) BP: (125-181)/(49-113) 157/60 (08/19 0600) SpO2:  [94 %-98 %] 98 % (08/19 0814) FiO2 (%):  [24 %] 24 % (08/19 0814) Weight:  [66.4 kg] 66.4 kg (08/19 0436)  Hemodynamic parameters for last 24 hours:    Intake/Output from previous day: 08/18 0701 - 08/19 0700 In: 150 [IV Piggyback:150] Out: 970 [Urine:750; Emesis/NG output:220]  Intake/Output this shift: No intake/output data recorded.  Vent settings for last 24 hours: Vent Mode: PRVC FiO2 (%):  [24 %] 24 % Set Rate:  [12 bmp] 12 bmp Vt Set:  [400 mL] 400 mL PEEP:  [5 cmH20] 5 cmH20 Plateau Pressure:  [10 cmH20-15 cmH20] 12 cmH20  Physical Exam:   General:  Acutely ill appearing female, laying in bed, intubated, in NAD Neuro:  Unresponsive, no sedation, Pupils PERRL 2 mm sluggish bilaterally HEENT:  Atraumatic, normocephalic, neck supple, ETT in place, No JVD Cardiovascular:  RRR, s1s2, 3/6 murmur Lungs:  Clear bilaterally, no  wheezing, even, non-labored, vent assisted Abdomen:  Soft, non-tender, non-distended, BS+ x4 Musculoskeletal:  No deformities, no edema Neurologic: Patient does posture with her right to pain, positive gag, positive breathing over the ventilator   Assessment/Plan:   72 y.o. Female found unresponsive 03/04/18 in setting of possible drug overdose requiring intubation for airway protection.  Unclear if she took meds or other etiology as to cause unresponsiveness  Has had a negative head CT, pending the results of EEG, MRI performed last night negative study, appreciate neurology's input  Respiratory failure. Intubated for airway protection.We'll continue mechanical ventilation until mental status improves. Chest x-ray this morning pending.   End-stage renal disease. Being followed by nephrology, BUN 70, creatinine 5.42, potassium is 3.8, CO2 is 26.  Positive blood culture. Gram-positive cocci strep species. On rocephin. Patient grew viridans, will obtain echocardiogram tomorrow  Critical Care Total Time 35  Dorthula Bier 03/08/2018 Patient ID: Joyce [name], female   DOB: September 15, 1945, 72 y.o.   MRN: [id number] Patient ID: Joyce [name], female   DOB: 01/08/1946, 72 y.o.   MRN: [id number] Patient ID: Joyce [name], female   DOB: June 30, 1946, 72 y.o.   MRN: [id number]

## 2018-03-08 NOTE — Progress Notes (Signed)
Post HD assessment    03/08/18 1602  Neurological  Level of Consciousness Alert  Orientation Level Oriented to person  Respiratory  Respiratory Pattern Unlabored  Cardiac  ECG Monitor Yes  Cardiac Rhythm ST  Vascular  R Radial Pulse +2  L Radial Pulse +2  Integumentary  Integumentary (WDL) X  Skin Color Appropriate for ethnicity  Musculoskeletal  Musculoskeletal (WDL) X  Generalized Weakness Yes  Assistive Device None  GU Assessment  Genitourinary (WDL) X  Genitourinary Symptoms  (HD)  Psychosocial  Psychosocial (WDL) X  Patient Behaviors Anxious;Agitated

## 2018-03-08 NOTE — Progress Notes (Signed)
Pre HD assessment    03/08/18 1210  Neurological  Level of Consciousness Responds to Voice  Orientation Level Oriented to person  Respiratory  Respiratory Pattern Unlabored  Cardiac  ECG Monitor Yes  Cardiac Rhythm ST  Vascular  R Radial Pulse +2  L Radial Pulse +2  Integumentary  Integumentary (WDL) X  Skin Color Appropriate for ethnicity  Musculoskeletal  Musculoskeletal (WDL) X  Generalized Weakness Yes  Assistive Device None  GU Assessment  Genitourinary (WDL) X  Genitourinary Symptoms Urinary Catheter (HD)  Psychosocial  Psychosocial (WDL) X  Patient Behaviors Calm;Cooperative;Flat affect

## 2018-03-08 NOTE — Significant Event (Signed)
Pt has passed SBT. Very agitated but + F/C. Extubation ordered. Precedex ordered if needed  Merton Border, MD PCCM service Mobile [phone] Pager [phone] 03/08/2018 4:49 PM

## 2018-03-08 NOTE — Clinical Social Work Note (Signed)
Patient remains intubated at this time. Shela Leff MSW,LCSW [phone]

## 2018-03-08 NOTE — Progress Notes (Signed)
Pharmacy Antibiotic Note  Joyce [name] is a 72 y.o. female admitted on 03/04/2018 with a chief complaint of unresponsiveness s/t drug overdose. BCID showed Staph species, BCx grew staph viridans. Pharmacy has been consulted for ceftriaxone dosing. ID consulted on 8/19.  Plan: Continue Ceftriaxone 2g IV daily.  Will continue to monitor blood cultures, 1/2 showing streptococcus.   Repeat blood cultures on 8/16 no growth to date.     Height: 5\' 3"  (160 cm) Weight: 146 lb 6.2 oz (66.4 kg) IBW/kg (Calculated) : 52.4  Temp (24hrs), Avg:99 F (37.2 C), Min:98 F (36.7 C), Max:100 F (37.8 C)  Recent Labs  Lab 03/02/18 1013 03/04/18 1053 03/05/18 0203 03/06/18 0815 03/08/18 0453  WBC 7.4 6.7 9.7  --   --   CREATININE 4.62* 3.85* 4.26* 3.47* 5.42*  LATICACIDVEN  --  1.9  --   --   --     Estimated Creatinine Clearance: 8.7 mL/min (A) (by C-G formula based on SCr of 5.42 mg/dL (H)).    Allergies  Allergen Reactions  . Morphine And Related Shortness Of Breath    Pt reports chest pain and difficulty breathing.  . Indomethacin Hives  . Pollen Extract Other (See Comments)    Sinus problems and HA    Antimicrobials this admission: Ceftriaxone 08/16 >>  Microbiology results: 8/15 BCx: gram positive cocci (1/2 - streptococcus on BCID) 8/16 BCx: NG x 3 days  8/15 MRSA PCR: negative  Thank you for allowing pharmacy to be a part of this patient's care.  Pernell Dupre, PharmD, BCPS Clinical Pharmacist 03/08/2018 2:12 PM

## 2018-03-08 NOTE — Progress Notes (Signed)
Pt became agitated midway through tx, which extended into the end of HD tx. Pt experienced an elevated HR alongside agitation. Tx terminated 12 minutes early r/t increased venous pressure, pt agitation, and increased risk for infiltration/access damage, MD aware. Net UF -124, goal not met. UF turned off and BFR decreased  r/t elevated HR, MD aware.    03/08/18 1603  Vital Signs  Temp 99 F (37.2 C)  Temp Source Axillary  Pulse Rate (!) 120  Pulse Rate Source Monitor  Resp 20  BP (!) 145/69  BP Location Right Arm  BP Method Automatic  Patient Position (if appropriate) Lying  Oxygen Therapy  SpO2 100 %  O2 Device Ventilator  End Tidal CO2 (EtCO2) 34  Dialysis Weight  Weight 67.2 kg  Type of Weight Post-Dialysis  Post-Hemodialysis Assessment  Rinseback Volume (mL) 250 mL  KECN 70.6 V  Dialyzer Clearance Lightly streaked  Duration of HD Treatment -hour(s) 3.35 hour(s)  Hemodialysis Intake (mL) 500 mL  UF Total -Machine (mL) 376 mL  Net UF (mL) -124 mL  Tolerated HD Treatment Yes  AVG/AVF Arterial Site Held (minutes) 10 minutes  AVG/AVF Venous Site Held (minutes) 10 minutes  Education / Care Plan  Dialysis Education Provided Yes  Documented Education in Care Plan Yes  Fistula / Graft Left Upper arm Arteriovenous fistula  Placement Date: 08/13/15   Placed prior to admission: Yes  Orientation: Left  Access Location: Upper arm  Access Type: Arteriovenous fistula  Site Condition No complications  Fistula / Graft Assessment Present;Thrill;Bruit  Status Deaccessed  Drainage Description None

## 2018-03-08 NOTE — Progress Notes (Signed)
HOB elevated to high fowlers position, ciff deflated, suctioned orally and endotracheally and then extubated to 3 lpm o2 [place]

## 2018-03-08 NOTE — Progress Notes (Deleted)
Pre HD assessment    03/08/18 1110  Neurological  Level of Consciousness Responds to Voice  Orientation Level Oriented to person  Respiratory  Respiratory Pattern Unlabored  Cardiac  ECG Monitor Yes  Cardiac Rhythm ST  Vascular  R Radial Pulse +2  L Radial Pulse +2  Integumentary  Integumentary (WDL) X  Skin Color Appropriate for ethnicity  Musculoskeletal  Musculoskeletal (WDL) X  Generalized Weakness Yes  Assistive Device None  Gastrointestinal  Bowel Sounds Assessment Active  NG/OG Tube Orogastric 14 Fr. Center mouth Aucultation Measured external length of tube  Placement Date/Time: 03/04/18 1120   Person Inserting Catheter: Cassie, RN  Tube Type: Orogastric  Tube Size (Fr.): 14 Fr.  Tube Location: Center mouth  Initial Placement Verification: Aucultation  Technique Used to Measure Tube Placement: (c) Measure...  External Length of Tube (cm) - (if applicable) 55 cm  Site Assessment Clean;Dry;Intact  Ongoing Placement Verification No acute changes, not attributed to clinical condition  Status Suction-low intermittent  GU Assessment  Genitourinary (WDL) X  Genitourinary Symptoms Urinary Catheter (HD)  Urethral Catheter Cassie, RN 14 Fr.  Placement Date/Time: 03/04/18 1121   Perineal care performed prior to insertion?: Yes  Person Inserting Catheter: Cassie, RN  Person Assisting with Catheter Insertion: Vet, EDT  Patient Location at Time of Insertion: ER 7  Tube Size (Fr.): 14 Fr.  Cat...  Output (mL) 200 mL  Psychosocial  Psychosocial (WDL) X  Patient Behaviors Calm;Cooperative;Flat affect

## 2018-03-08 NOTE — Progress Notes (Signed)
HD tx end    03/08/18 1601  Vital Signs  Pulse Rate (!) 119  Pulse Rate Source Monitor  Resp 13  BP (!) 141/72  BP Location Right Arm  BP Method Automatic  Patient Position (if appropriate) Lying  Oxygen Therapy  SpO2 99 %  O2 Device Ventilator  End Tidal CO2 (EtCO2) 34  During Hemodialysis Assessment  Dialysis Fluid Bolus Normal Saline  Bolus Amount (mL) 250 mL  Intra-Hemodialysis Comments Tx completed

## 2018-03-08 NOTE — Progress Notes (Signed)
Pre HD assessment   03/08/18 1155  Vital Signs  Temp 98.6 F (37 C)  Temp Source Axillary  Pulse Rate (!) 107  Pulse Rate Source Monitor  Resp 10  BP (!) 181/73  BP Location Right Arm  BP Method Automatic  Patient Position (if appropriate) Lying  Oxygen Therapy  SpO2 99 %  O2 Device Ventilator  End Tidal CO2 (EtCO2) 40  Critical Care Pain Observation Tool (CPOT)  Facial Expression 0  Body Movements 0  Muscle Tension 0  Compliance with ventilator (intubated pts.) 0  Vocalization (extubated pts.) N/A  CPOT Total 0  Dialysis Weight  Weight 66.4 kg  Type of Weight Pre-Dialysis  Time-Out for Hemodialysis  What Procedure? HD  Pt Identifiers(min of two) First/Last Name;MRN/Account#  Correct Site? Yes  Correct Side? Yes  Correct Procedure? Yes  Consents Verified? Yes  Rad Studies Available? N/A  Safety Precautions Reviewed? Yes  Engineer, civil (consulting) Number  (3A)  Station Number  (bedside, ICU 04)  UF/Alarm Test Passed  Conductivity: Meter 13.6  Conductivity: Machine  13.9  pH 7.6  Reverse Osmosis  [phone], [id number], #3)  Normal Saline Lot Number [id number]  Dialyzer Lot Number [id number]  Disposable Set Lot Number [id number]  Machine Temperature 98.6 F (37 C)  Musician and Audible Yes  Blood Lines Intact and Secured Yes  Pre Treatment Patient Checks  Vascular access used during treatment Fistula  Date Hepatitis B Surface Antigen Drawn 01/06/18  Hepatitis B Surface Antibody  (>10)  Date Hepatitis B Surface Antibody Drawn 02/24/18  Hemodialysis Consent Verified Yes  Hemodialysis Standing Orders Initiated Yes  ECG (Telemetry) Monitor On Yes  Prime Ordered Normal Saline  Length of  DialysisTreatment -hour(s) 3.5 Hour(s)  Dialyzer Elisio 17H NR  Dialysate 3K, 2.5 Ca  Dialysis Anticoagulant None  Dialysate Flow Ordered 800  Blood Flow Rate Ordered 400 mL/min  Ultrafiltration Goal 0 Liters  Dialysis Blood Pressure Support Ordered Normal Saline  Education /  Care Plan  Dialysis Education Provided Yes  Documented Education in Care Plan Yes  Fistula / Graft Left Upper arm Arteriovenous fistula  Placement Date: 08/13/15   Placed prior to admission: Yes  Orientation: Left  Access Location: Upper arm  Access Type: Arteriovenous fistula  Site Condition No complications  Fistula / Graft Assessment Present;Thrill;Bruit  Drainage Description None

## 2018-03-08 NOTE — Progress Notes (Signed)
Patient continues to be restless and agitated.  Dr., Alva Garnet notified and fentanyl 80mcg ordered and given.

## 2018-03-08 NOTE — Progress Notes (Signed)
[facility] NAME: Joyce [name]    MR#:  [id number]  DATE OF BIRTH:  January 18, 1946  SUBJECTIVE:  CHIEF COMPLAINT:   Chief Complaint  Patient presents with  . Other    Unresponsive  EEG pending, for echocardiogram on tomorrow  REVIEW OF SYSTEMS:  CONSTITUTIONAL: No fever, fatigue or weakness.  EYES: No blurred or double vision.  EARS, NOSE, AND THROAT: No tinnitus or ear pain.  RESPIRATORY: No cough, shortness of breath, wheezing or hemoptysis.  CARDIOVASCULAR: No chest pain, orthopnea, edema.  GASTROINTESTINAL: No nausea, vomiting, diarrhea or abdominal pain.  GENITOURINARY: No dysuria, hematuria.  ENDOCRINE: No polyuria, nocturia,  HEMATOLOGY: No anemia, easy bruising or bleeding SKIN: No rash or lesion. MUSCULOSKELETAL: No joint pain or arthritis.   NEUROLOGIC: No tingling, numbness, weakness.  PSYCHIATRY: No anxiety or depression.   ROS  DRUG ALLERGIES:   Allergies  Allergen Reactions  . Morphine And Related Shortness Of Breath    Pt reports chest pain and difficulty breathing.  . Indomethacin Hives  . Pollen Extract Other (See Comments)    Sinus problems and HA    VITALS:  Blood pressure (!) 167/89, pulse (!) 122, temperature 98.6 F (37 C), temperature source Axillary, resp. rate 16, height 5\' 3"  (1.6 m), weight 66.4 kg, SpO2 100 %.  PHYSICAL EXAMINATION:  GENERAL:  72 y.o.-year-old patient lying in the bed with no acute distress.  EYES: Pupils equal, round, reactive to light and accommodation. No scleral icterus. Extraocular muscles intact.  HEENT: Head atraumatic, normocephalic. Oropharynx and nasopharynx clear.  NECK:  Supple, no jugular venous distention. No thyroid enlargement, no tenderness.  LUNGS: Normal breath sounds bilaterally, no wheezing, rales,rhonchi or crepitation. No use of accessory muscles of respiration.  CARDIOVASCULAR: S1, S2 normal. No murmurs, rubs, or gallops.  ABDOMEN: Soft, nontender,  nondistended. Bowel sounds present. No organomegaly or mass.  EXTREMITIES: No pedal edema, cyanosis, or clubbing.  NEUROLOGIC: Cranial nerves II through XII are intact. Muscle strength 5/5 in all extremities. Sensation intact. Gait not checked.  PSYCHIATRIC: The patient is alert and oriented x 3.  SKIN: No obvious rash, lesion, or ulcer.   Physical Exam LABORATORY PANEL:   CBC Recent Labs  Lab 03/05/18 0203  WBC 9.7  HGB 10.7*  HCT 31.2*  PLT 158   ------------------------------------------------------------------------------------------------------------------  Chemistries  Recent Labs  Lab 03/04/18 1053  03/08/18 0453  NA 135   < > 146*  K 4.5   < > 3.8  CL 100   < > 106  CO2 27   < > 26  GLUCOSE 127*   < > 103*  BUN 18   < > 70*  CREATININE 3.85*   < > 5.42*  CALCIUM 8.4*   < > 8.9  MG  --    < > 2.9*  AST 21  --   --   ALT 15  --   --   ALKPHOS 66  --   --   BILITOT 0.7  --   --    < > = values in this interval not displayed.   ------------------------------------------------------------------------------------------------------------------  Cardiac Enzymes Recent Labs  Lab 03/05/18 1817 03/08/18 0453  TROPONINI 0.05* 0.06*   ------------------------------------------------------------------------------------------------------------------  RADIOLOGY:  Mr Brain Wo Contrast  Result Date: 03/06/2018 CLINICAL DATA:  Initial evaluation for acute altered mental status, encephalopathy. EXAM: MRI HEAD WITHOUT CONTRAST TECHNIQUE: Multiplanar, multiecho pulse sequences of the brain and surrounding structures were obtained without intravenous contrast.  COMPARISON:  Prior CT from 03/05/2018. FINDINGS: Brain: Generalized age-related cerebral atrophy. Few scattered T2/FLAIR hyperintensities noted within the periventricular deep white matter both cerebral hemispheres, nonspecific, but mild for age. No evidence for acute or subacute infarct. Gray-white matter differentiation  maintained. No areas of remote cortical infarction. No acute or chronic intracranial hemorrhage. No mass lesion, midline shift or mass effect. No hydrocephalus. No extra-axial fluid collection. Normal pituitary gland. Vascular: Major intracranial vascular flow voids are maintained Skull and upper cervical spine: Craniocervical junction normal. Upper cervical spine normal. No focal marrow replacing lesion. Scalp soft tissues unremarkable. Sinuses/Orbits: Globes and orbital soft tissues demonstrate no acute finding. Patient status post ocular lens replacement bilaterally. Paranasal sinuses largely clear. Fluid within the nasopharynx. Patient appears to be intubated. No significant mastoid effusion. Inner ear structures grossly normal. Other: None. IMPRESSION: 1. No acute intracranial abnormality. 2. Age-related cerebral atrophy with mild bile specific cerebral white matter changes. Electronically Signed   By: Jeannine Boga M.D.   On: 03/06/2018 22:46   Dg Chest Port 1 View  Result Date: 03/07/2018 CLINICAL DATA:  Ventilated patient. EXAM: PORTABLE CHEST 1 VIEW COMPARISON:  1 day prior FINDINGS: Endotracheal tube terminates 2.5 cm above carina.Nasogastric tube extends beyond the inferior aspect of the film. Numerous leads and wires project over the chest. Patient rotated left. Mild cardiomegaly. Atherosclerosis in the transverse aorta. Possible small left pleural effusion. No pneumothorax. No congestive failure. Persistent left hemidiaphragm elevation with left base atelectasis. Left axillary stent. IMPRESSION: No significant change since one day prior. Cardiomegaly with low lung volumes and left base atelectasis. Possible small left pleural effusion. Electronically Signed   By: Abigail Miyamoto M.D.   On: 03/07/2018 11:10    ASSESSMENT AND PLAN:  72 year old female with past medical history significant for bipolar disorder who was just discharged from [facility] unit on 03/03/2018, history  of end-stage renal disease on hemodialysis, hypertension, polycystic kidney disease presents to hospital secondary to an unresponsive episode  *Acute toxic metabolic encephalopathy Presented with acute unresponsiveness Resolving  EEG noted for generalized slowing, neurology input appreciated, MRI of the brain was a negative study, CT head was a negative study, continue neurochecks per routine, increase nursing care PRN, aspiration/fall precautions while in house  *End-stage renal disease on dialysis Nephrology input appreciated-for hemodialysis later today, typically receives MWF Has a left AV fistula, last dialysis on 03/05/18  *Acute strep viridans bacteremia For echocardiogram on tomorrow Continue empiric Rocephin, follow-up on outstanding cultures  *Chronic Bipolar disorder concern for drug overdose - Intentional versus unintentional. will need psychiatry consult, continue to avoid psychotropic meds  *Anemia of chronic disease continue EPO with dialysis.   All the records are reviewed and case discussed with Care Management/Social Workerr. Management plans discussed with the patient, family and they are in agreement.  CODE STATUS: full  TOTAL TIME TAKING CARE OF THIS PATIENT: 45 minutes.     POSSIBLE D/C IN 2-5 DAYS, DEPENDING ON CLINICAL CONDITION.   Avel Peace Bitania Shankland M.D on 03/08/2018   Between 7am to 6pm - Pager - [phone]  After 6pm go to [email or web address] - password [email or web address] [facility]  Office  [phone]  CC: Primary care physician; Cletis Athens, MD  Note: This dictation was prepared with Dragon dictation along with smaller phrase technology. Any transcriptional errors that result from this process are unintentional.

## 2018-03-08 NOTE — Consult Note (Signed)
Date of Admission:  03/04/2018                 Reason for Consult: Strep viridans bacteremia   Referring Provider: Jefferson Fuel MD Pt unable to give history, chart reviewed  HPI: Joyce [name] is a 72 y.o. female with h/o bipolar disorder ESRD on dialysis Brought to the ED after being found unresponsive  with pills around her on 03/04/18. Pt was just discharged on 03/03/18 to live independently after being in the behavioral until for 4 days for manic episode Her family saw her at 5 pm that evening.  Review of home medications indicates she takes Carbamazepine, Haloperidol, Temazepam, and Ropinirole. Initial workup in the ED revealed: Na 135, Creatinine 3.85, Lactic Acid 1.9, UA negative, and CXR w/ stable cardiomegaly. She was intubated in the Ed and admitted to ICU with a diagnosis of overdose  . Head CT was normal. Urine tox screen was ok- but tegretol level and benzo level were not done. Temp curve while in the hospital.     She was hypothermic once and then Temp of 100.9 on 8/16. Supportive management. Was started on IV vanco and ceftriaxone after the blood culture from 8/15 showed gram positive cocci. A repeat blood culture  was sent before antibiotic was started on 03/05/18. Pt was extubated today. I am asked to see the patient for strep viridans bacteremia  Past Medical History:  Diagnosis Date  . Anal fissure   . Bipolar affective disorder ([place])   . CKD (chronic kidney disease)    Dr Holley Raring [name] 4  . Colon polyps   . Diverticulitis   . Diverticulitis   . Family history of adverse reaction to anesthesia    mom - PONV  . GERD (gastroesophageal reflux disease)   . Headache    migraines - none over 10 yrs  . Heart murmur   . History of hiatal hernia   . Hypertension   . Pancreatitis    Valproic acid  . Vertigo     Past Surgical History:  Procedure Laterality Date  . A/V FISTULAGRAM Left 04/14/2017   Procedure: A/V Fistulagram;  Surgeon: Katha Cabal, MD;   Location: [facility];  Service: Cardiovascular;  Laterality: Left;  . A/V FISTULAGRAM Left 06/09/2017   Procedure: A/V FISTULAGRAM;  Surgeon: Katha Cabal, MD;  Location: [facility];  Service: Cardiovascular;  Laterality: Left;  . A/V FISTULAGRAM Left 11/02/2017   Procedure: A/V FISTULAGRAM;  Surgeon: Algernon Huxley, MD;  Location: [facility];  Service: Cardiovascular;  Laterality: Left;  . A/V SHUNT INTERVENTION N/A 11/02/2017   Procedure: A/V SHUNT INTERVENTION;  Surgeon: Algernon Huxley, MD;  Location: [facility];  Service: Cardiovascular;  Laterality: N/A;  . ABDOMINAL HYSTERECTOMY  1990 ?  . AV FISTULA PLACEMENT  4/30  . BREAST EXCISIONAL BIOPSY Left 1994   neg surgical bx  . CHOLECYSTECTOMY  2003  . COLONOSCOPY  2014   Dr. Jamal Collin  . COLONOSCOPY WITH PROPOFOL N/A 09/24/2015   Procedure: COLONOSCOPY WITH random colon byopies.;  Surgeon: Lucilla Lame, MD;  Location: [facility];  Service: Endoscopy;  Laterality: N/A;  . ESOPHAGOGASTRODUODENOSCOPY (EGD) WITH PROPOFOL N/A 09/24/2015   Procedure: ESOPHAGOGASTRODUODENOSCOPY (EGD) ;  Surgeon: Lucilla Lame, MD;  Location: [facility];  Service: Endoscopy;  Laterality: N/A;  . EYE SURGERY    . PERIPHERAL VASCULAR CATHETERIZATION N/A 05/29/2015   Procedure: A/V Shuntogram/Fistulagram;  Surgeon: Katha Cabal, MD;  Location: [facility];  Service: Cardiovascular;  Laterality: N/A;  . PERIPHERAL VASCULAR CATHETERIZATION N/A 05/29/2015   Procedure: A/V Shunt Intervention;  Surgeon: Katha Cabal, MD;  Location: [facility];  Service: Cardiovascular;  Laterality: N/A;  . POLYPECTOMY  09/24/2015   Procedure: POLYPECTOMY INTESTINAL;  Surgeon: Lucilla Lame, MD;  Location: [facility];  Service: Endoscopy;;  cecal polyp ascending polyp    Social History   Tobacco Use  . Smoking status: Never Smoker  . Smokeless tobacco: Never Used  Substance Use Topics  . Alcohol use:  No    Alcohol/week: 0.0 standard drinks  . Drug use: No    Family History  Problem Relation Age of Onset  . Stroke Father   . Hypertension Father   . Breast cancer Other   . Colon cancer Neg Hx   . Liver disease Neg Hx    Allergies  Allergen Reactions  . Morphine And Related Shortness Of Breath    Pt reports chest pain and difficulty breathing.  . Indomethacin Hives  . Pollen Extract Other (See Comments)    Sinus problems and HA    . chlorhexidine gluconate (MEDLINE KIT)  15 mL Mouth Rinse BID  . Chlorhexidine Gluconate Cloth  6 each Topical Q0600  . epoetin (EPOGEN/PROCRIT) injection  4,000 Units Intravenous Q M,W,F-HD  . heparin  5,000 Units Subcutaneous Q8H  . mouth rinse  15 mL Mouth Rinse 10 times per day      Abtx:  Anti-infectives (From admission, onward)   Start     Dose/Rate Route Frequency Ordered Stop   03/05/18 0900  cefTRIAXone (ROCEPHIN) 2 g in sodium chloride 0.9 % 100 mL IVPB     2 g 200 mL/hr over 30 Minutes Intravenous Daily 03/05/18 0849     03/05/18 0830  vancomycin (VANCOCIN) IVPB 1000 mg/200 mL premix  Status:  Discontinued     1,000 mg 200 mL/hr over 60 Minutes Intravenous  Once 03/05/18 0828 03/05/18 0849       Review of Systems: No history available from patient . S/p extubation and drowsy  OBJECTIVE: Blood pressure (!) 169/82, pulse (!) 109, temperature 98.6 F (37 C), temperature source Axillary, resp. rate 16, height '5\' 3"'$  (1.6 m), weight 66.4 kg, SpO2 100 %.  Physical Exam Constitutional, drowsy, responds to simple questions, voice hoarse  Eyes: PERL ENT tongue dry, no maxilaary tenderness, nasal cannula CVS- s1s2 , tachycardia Left AV fistula RS b/l air entry- decreased abses GI - soft no tenderness GU- no foley MSK- no leg edema SKIN-no rash Neurologic- awake, moves all limbs Psychiatric- no agitation  Lymphatic no LN palpable  Lab Results CBC    Component Value Date/Time   WBC 9.7 03/05/2018 0203   RBC 3.17 (L)  03/05/2018 0203   HGB 10.7 (L) 03/05/2018 0203   HGB 10.3 (L) 11/09/2014 1027   HCT 31.2 (L) 03/05/2018 0203   HCT 31.9 (L) 11/09/2014 1027   PLT 158 03/05/2018 0203   PLT 248 11/09/2014 1027   MCV 98.3 03/05/2018 0203   MCV 94 11/09/2014 1027   MCH 33.7 03/05/2018 0203   MCHC 34.3 03/05/2018 0203   RDW 13.6 03/05/2018 0203   RDW 13.5 11/09/2014 1027   LYMPHSABS 1.1 12/14/2017 2240   LYMPHSABS 1.6 05/11/2014 0355   MONOABS 0.5 12/14/2017 2240   MONOABS 0.6 05/11/2014 0355   EOSABS 0.1 12/14/2017 2240   EOSABS 0.1 05/11/2014 0355   BASOSABS 0.0 12/14/2017 2240   BASOSABS  0.0 05/11/2014 0355    CMP Latest Ref Rng & Units 03/08/2018 03/06/2018 03/05/2018  Glucose 70 - 99 mg/dL 103(H) 141(H) 122(H)  BUN 8 - 23 mg/dL 70(H) 24(H) 22  Creatinine 0.44 - 1.00 mg/dL 5.42(H) 3.47(H) 4.26(H)  Sodium 135 - 145 mmol/L 146(H) 141 136  Potassium 3.5 - 5.1 mmol/L 3.8 3.9 4.8  Chloride 98 - 111 mmol/L 106 104 103  CO2 22 - 32 mmol/L '26 28 24  '$ Calcium 8.9 - 10.3 mg/dL 8.9 9.1 8.7(L)  Total Protein 6.5 - 8.1 g/dL - - -  Total Bilirubin 0.3 - 1.2 mg/dL - - -  Alkaline Phos 38 - 126 U/L - - -  AST 15 - 41 U/L - - -  ALT 0 - 44 U/L - - -     Blood Culture 8/15 -I bottle has strep viridans    Component Value Date/Time   SDES BLOOD RIGHT HAND 03/05/2018 0203   SPECREQUEST  03/05/2018 0203    BOTTLES DRAWN AEROBIC ONLY Blood Culture results may not be optimal due to an inadequate volume of blood received in culture bottles   CULT  03/05/2018 0203    NO GROWTH 4 DAYS Performed at [facility], [address], [place],  [address]    REPTSTATUS PENDING 03/05/2018 0203      Radiographs and labs were personally reviewed by me.  CXR- cardiomegaly with no infiltrate  Assessment and Plan 72 y.o. female with h/o bipolar disorder ESRD on dialysis Brought to the ED after being found unresponsive  with pills around her on 03/04/18. Pt was just discharged on 03/03/18 to live  independently after being in the behavioral until for 4 days for manic episode Her family saw her at 5 pm that evening.  Intentional overdose with unresponsiveness needing intubation to protect airways- s/p extubation- still drowsy  Strep viridans bacteremia- 1 bottleonly- could be a contaminant VS true pathogen. No h/o fever before and was just discharged from Psychiatric  until the previous evening and was on no recent antibiotics--hence an infection is less likely. But as her mental status is still not at baseline and a history cannot be obtained now will continue ceftriaxone.  2 d echo no vegetations Will not do TEE now,  Bipolar disorder -was on carbamazepine, haldol  ESRD on dialysis- has left AV fistula site okay  Discussed the management with her nurse  Tsosie Billing, MD  03/08/2018, 12:59 PM

## 2018-03-08 NOTE — Care Management (Signed)
Patient remains intubated; ICU status

## 2018-03-08 NOTE — Progress Notes (Signed)
Dialysis nurse called out for help due to patient being restless and agitated while receiveing  dialysis.  Heart rate increased from 105 to 125.  Patient attempting to pull on medical equipment.  Patient asked if she was hurting anywhere she shook her head no.  When patient asked if she needed to be repositioned she shook her head no.  Attempted repositioning patient with no improvement. Fentanyl given per odder.

## 2018-03-08 NOTE — Progress Notes (Signed)
Patient continues to be agitated.  Dr. Alva Garnet made aware and has rounded on patient.  After assessment order obtained to extubate patient.

## 2018-03-08 NOTE — Progress Notes (Signed)
HD tx start    03/08/18 1227  Vital Signs  Pulse Rate (!) 107  Pulse Rate Source Monitor  Resp 17  BP (!) 176/72  BP Location Right Arm  BP Method Automatic  Patient Position (if appropriate) Lying  Oxygen Therapy  SpO2 98 %  O2 Device Ventilator  End Tidal CO2 (EtCO2) 38  During Hemodialysis Assessment  Blood Flow Rate (mL/min) 400 mL/min  Arterial Pressure (mmHg) -100 mmHg  Venous Pressure (mmHg) 200 mmHg  Transmembrane Pressure (mmHg) 60 mmHg  Ultrafiltration Rate (mL/min) 140 mL/min  Dialysate Flow Rate (mL/min) 800 ml/min  Conductivity: Machine  13.8  HD Safety Checks Performed Yes  Dialysis Fluid Bolus Normal Saline  Bolus Amount (mL) 250 mL  Intra-Hemodialysis Comments Tx initiated  Fistula / Graft Left Upper arm Arteriovenous fistula  Placement Date: 08/13/15   Placed prior to admission: Yes  Orientation: Left  Access Location: Upper arm  Access Type: Arteriovenous fistula  Status Accessed  Needle Size 15

## 2018-03-08 NOTE — Progress Notes (Signed)
[facility]  ROUNDING NOTE   Subjective:  Patient remains critically ill at the moment. Still intubated. However does open her eyes and follows simple commands this a.m. Due for dialysis today.     Objective:  Vital signs in last 24 hours:  Temp:  [98.4 F (36.9 C)-100 F (37.8 C)] 99.3 F (37.4 C) (08/19 0400) Pulse Rate:  [94-113] 107 (08/19 0600) Resp:  [10-21] 14 (08/19 0600) BP: (125-181)/(49-113) 157/60 (08/19 0600) SpO2:  [94 %-98 %] 98 % (08/19 0814) FiO2 (%):  [24 %] 24 % (08/19 0814) Weight:  [66.4 kg] 66.4 kg (08/19 0436)  Weight change: -0.8 kg Filed Weights   03/06/18 0425 03/07/18 0421 03/08/18 0436  Weight: 71.8 kg 67.2 kg 66.4 kg    Intake/Output: I/O last 3 completed shifts: In: 150 [IV Piggyback:150] Out: 9675 [Urine:1000; Emesis/NG output:220]   Intake/Output this shift:  No intake/output data recorded.  Physical Exam: General: Critically ill appearing  Head: ET tube in place  Eyes: Anicteric  Neck: Supple  Lungs:  Ventilator assisted, scattered rhonchi  Heart: S1S2 no rubs  Abdomen:  Soft, nontender, BS present  Extremities: Trace peripheral edema.  Neurologic: Opens eyes, did follow commands with upper extremeties  Skin: No lesions  Access: Left AVF    Basic Metabolic Panel: Recent Labs  Lab 03/02/18 1013 03/04/18 1053 03/04/18 1341 03/05/18 0203 03/06/18 0815 03/08/18 0453  NA 134* 135  --  136 141 146*  K 3.8 4.5  --  4.8 3.9 3.8  CL 93* 100  --  103 104 106  CO2 32 27  --  '24 28 26  '$ GLUCOSE 93 127*  --  122* 141* 103*  BUN 30* 18  --  22 24* 70*  CREATININE 4.62* 3.85*  --  4.26* 3.47* 5.42*  CALCIUM 9.0 8.4*  --  8.7* 9.1 8.9  MG  --   --  2.0 1.9  --  2.9*  PHOS 4.0  --  3.5 5.2*  --  4.6    Liver Function Tests: Recent Labs  Lab 03/02/18 1013 03/04/18 1053  AST  --  21  ALT  --  15  ALKPHOS  --  66  BILITOT  --  0.7  PROT  --  5.7*  ALBUMIN 3.8 3.4*   No results for input(s): LIPASE, AMYLASE  in the last 168 hours. No results for input(s): AMMONIA in the last 168 hours.  CBC: Recent Labs  Lab 03/02/18 1013 03/04/18 1053 03/05/18 0203  WBC 7.4 6.7 9.7  HGB 10.9* 9.8* 10.7*  HCT 30.8* 27.2* 31.2*  MCV 96.6 95.6 98.3  PLT 133* 142* 158    Cardiac Enzymes: Recent Labs  Lab 03/05/18 1817 03/08/18 0453  TROPONINI 0.05* 0.06*    BNP: Invalid input(s): POCBNP  CBG: Recent Labs  Lab 03/07/18 1552 03/07/18 1949 03/08/18 0004 03/08/18 0338 03/08/18 0748  GLUCAP 115* 89 95 91 57    Microbiology: Results for orders placed or performed during the hospital encounter of 03/04/18  Urine culture     Status: None   Collection Time: 03/04/18 11:03 AM  Result Value Ref Range Status   Specimen Description   Final    URINE, RANDOM Performed at [name], [address] [place], [place] [address]    Special Requests   Final    NONE Performed at [facility], [address] [place], [place] [address]    Culture   Final    NO GROWTH  Performed at [facility], [place] [address], [place], [place] [address]    Report Status 03/06/2018 FINAL  Final  Blood Cultures (routine x 2)     Status: Abnormal   Collection Time: 03/04/18 11:18 AM  Result Value Ref Range Status   Specimen Description   Final    BLOOD RIGHT ARM Performed at [facility], [place] [address], [place], [place] [address]    Special Requests   Final    BOTTLES DRAWN AEROBIC AND ANAEROBIC Blood Culture adequate volume Performed at [facility], [address] [place], [place] [address]    Culture  Setup Time   Final    GRAM POSITIVE COCCI AEROBIC BOTTLE ONLY CRITICAL RESULT CALLED TO, READ BACK BY AND VERIFIED WITH: DAVID BESANTI ON 03/05/18 AT 0109 JAG    Culture (A)  Final    VIRIDANS STREPTOCOCCUS THE SIGNIFICANCE OF ISOLATING THIS ORGANISM FROM A SINGLE SET OF BLOOD CULTURES WHEN MULTIPLE SETS ARE DRAWN IS UNCERTAIN. PLEASE NOTIFY THE MICROBIOLOGY  DEPARTMENT WITHIN ONE WEEK IF SPECIATION AND SENSITIVITIES ARE REQUIRED. Performed at [facility], [place] [address], [place], [place] [address]    Report Status 03/07/2018 FINAL  Final  Blood Culture ID Panel (Reflexed)     Status: Abnormal   Collection Time: 03/04/18 11:18 AM  Result Value Ref Range Status   Enterococcus species NOT DETECTED NOT DETECTED Final   Listeria monocytogenes NOT DETECTED NOT DETECTED Final   Staphylococcus species NOT DETECTED NOT DETECTED Final   Staphylococcus aureus NOT DETECTED NOT DETECTED Final   Streptococcus species DETECTED (A) NOT DETECTED Final    Comment: Not Enterococcus species, Streptococcus agalactiae, Streptococcus pyogenes, or Streptococcus pneumoniae. CRITICAL RESULT CALLED TO, READ BACK BY AND VERIFIED WITH: DAVID BESANTI ON 03/05/18 AT 0109 BY JAG    Streptococcus agalactiae NOT DETECTED NOT DETECTED Final   Streptococcus pneumoniae NOT DETECTED NOT DETECTED Final   Streptococcus pyogenes NOT DETECTED NOT DETECTED Final   Acinetobacter baumannii NOT DETECTED NOT DETECTED Final   Enterobacteriaceae species NOT DETECTED NOT DETECTED Final   Enterobacter cloacae complex NOT DETECTED NOT DETECTED Final   Escherichia coli NOT DETECTED NOT DETECTED Final   Klebsiella oxytoca NOT DETECTED NOT DETECTED Final   Klebsiella pneumoniae NOT DETECTED NOT DETECTED Final   Proteus species NOT DETECTED NOT DETECTED Final   Serratia marcescens NOT DETECTED NOT DETECTED Final   Haemophilus influenzae NOT DETECTED NOT DETECTED Final   Neisseria meningitidis NOT DETECTED NOT DETECTED Final   Pseudomonas aeruginosa NOT DETECTED NOT DETECTED Final   Candida albicans NOT DETECTED NOT DETECTED Final   Candida glabrata NOT DETECTED NOT DETECTED Final   Candida krusei NOT DETECTED NOT DETECTED Final   Candida parapsilosis NOT DETECTED NOT DETECTED Final   Candida tropicalis NOT DETECTED NOT DETECTED Final    Comment: Performed at [facility],  [address] [place], [place] [address]  MRSA PCR Screening     Status: None   Collection Time: 03/04/18 12:37 PM  Result Value Ref Range Status   MRSA by PCR NEGATIVE NEGATIVE Final    Comment:        The GeneXpert MRSA Assay (FDA approved for NASAL specimens only), is one component of a comprehensive MRSA colonization surveillance program. It is not intended to diagnose MRSA infection nor to guide or monitor treatment for MRSA infections. Performed at [facility], [address], [place], [place] [address]   Culture, blood (Routine X 2)  w Reflex to ID Panel     Status: None (Preliminary result)   Collection Time: 03/05/18  2:02 AM  Result Value Ref Range Status   Specimen Description BLOOD RIGHT HAND  Final   Special Requests   Final    BOTTLES DRAWN AEROBIC ONLY Blood Culture results may not be optimal due to an inadequate volume of blood received in culture bottles   Culture   Final    NO GROWTH 3 DAYS Performed at [facility], [address], [place], [place] [address]    Report Status PENDING  Incomplete  Culture, blood (Routine X 2) w Reflex to ID Panel     Status: None (Preliminary result)   Collection Time: 03/05/18  2:03 AM  Result Value Ref Range Status   Specimen Description BLOOD RIGHT HAND  Final   Special Requests   Final    BOTTLES DRAWN AEROBIC ONLY Blood Culture results may not be optimal due to an inadequate volume of blood received in culture bottles   Culture   Final    NO GROWTH 3 DAYS Performed at [facility], [place] [place],  [address]    Report Status PENDING  Incomplete    Coagulation Studies: No results for input(s): LABPROT, INR in the last 72 hours.  Urinalysis: Recent Labs    03/07/18 1641  COLORURINE AMBER*  LABSPEC 1.011  PHURINE 5.0  GLUCOSEU NEGATIVE  HGBUR MODERATE*  BILIRUBINUR NEGATIVE  KETONESUR NEGATIVE  PROTEINUR 30*  NITRITE NEGATIVE  LEUKOCYTESUR MODERATE*       Imaging: Mr Brain Wo Contrast  Result Date: 03/06/2018 CLINICAL DATA:  Initial evaluation for acute altered mental status, encephalopathy. EXAM: MRI HEAD WITHOUT CONTRAST TECHNIQUE: Multiplanar, multiecho pulse sequences of the brain and surrounding structures were obtained without intravenous contrast. COMPARISON:  Prior CT from 03/05/2018. FINDINGS: Brain: Generalized age-related cerebral atrophy. Few scattered T2/FLAIR hyperintensities noted within the periventricular deep white matter both cerebral hemispheres, nonspecific, but mild for age. No evidence for acute or subacute infarct. Gray-white matter differentiation maintained. No areas of remote cortical infarction. No acute or chronic intracranial hemorrhage. No mass lesion, midline shift or mass effect. No hydrocephalus. No extra-axial fluid collection. Normal pituitary gland. Vascular: Major intracranial vascular flow voids are maintained Skull and upper cervical spine: Craniocervical junction normal. Upper cervical spine normal. No focal marrow replacing lesion. Scalp soft tissues unremarkable. Sinuses/Orbits: Globes and orbital soft tissues demonstrate no acute finding. Patient status post ocular lens replacement bilaterally. Paranasal sinuses largely clear. Fluid within the nasopharynx. Patient appears to be intubated. No significant mastoid effusion. Inner ear structures grossly normal. Other: None. IMPRESSION: 1. No acute intracranial abnormality. 2. Age-related cerebral atrophy with mild bile specific cerebral white matter changes. Electronically Signed   By: Jeannine Boga M.D.   On: 03/06/2018 22:46   Dg Chest Port 1 View  Result Date: 03/07/2018 CLINICAL DATA:  Ventilated patient. EXAM: PORTABLE CHEST 1 VIEW COMPARISON:  1 day prior FINDINGS: Endotracheal tube terminates 2.5 cm above carina.Nasogastric tube extends beyond the inferior aspect of the film. Numerous leads and wires project over the chest. Patient rotated left. Mild  cardiomegaly. Atherosclerosis in the transverse aorta. Possible small left pleural effusion. No pneumothorax. No congestive failure. Persistent left hemidiaphragm elevation with left base atelectasis. Left axillary stent. IMPRESSION: No significant change since one day prior. Cardiomegaly with low lung volumes and left base atelectasis. Possible small left pleural effusion. Electronically Signed   By: Abigail Miyamoto M.D.   On:  03/07/2018 11:10     Medications:   . cefTRIAXone (ROCEPHIN)  IV Stopped (03/07/18 0939)  . famotidine (PEPCID) IV Stopped (03/07/18 8828)  . feeding supplement (VITAL 1.5 CAL) Stopped (03/06/18 0755)  . phenylephrine (NEO-SYNEPHRINE) Adult infusion Stopped (03/04/18 1805)   . chlorhexidine gluconate (MEDLINE KIT)  15 mL Mouth Rinse BID  . Chlorhexidine Gluconate Cloth  6 each Topical Q0600  . epoetin (EPOGEN/PROCRIT) injection  4,000 Units Intravenous Q M,W,F-HD  . heparin  5,000 Units Subcutaneous Q8H  . mouth rinse  15 mL Mouth Rinse 10 times per day  . midodrine  5 mg Oral BID WC   acetaminophen **OR** acetaminophen, fentaNYL (SUBLIMAZE) injection, fentaNYL (SUBLIMAZE) injection, hydrALAZINE, ipratropium-albuterol, midazolam, ondansetron **OR** ondansetron (ZOFRAN) IV  Assessment/ Plan:  [name] is a 72 y.o. white female with end stage renal disease on hemodialysis, hypertension, bipolar disorder, GERD, polycystic kidney disease  [facility] MWF left AVF  1. End Stage Renal Disease  Patient due for hemodialysis today at bedside in the critical care unit.  Orders have been prepared.  2.  Acute Resp failure -Patient is arousable this a.m. and following commands.  Weaning from the later ongoing and she can possibly be extubated today.  3. Anemia of chronic kidney disease: Most recent hemoglobin was 10.7.  Maintain the patient on Epogen with dialysis.  4.  Altered mental status Much more awake and alert this a.m.  Following  commands.       LOS: 4 Brielyn Bosak 8/19/201910:03 AM

## 2018-03-09 ENCOUNTER — Other Ambulatory Visit: Payer: Self-pay

## 2018-03-09 DIAGNOSIS — F319 Bipolar disorder, unspecified: Secondary | ICD-10-CM

## 2018-03-09 DIAGNOSIS — T1491XA Suicide attempt, initial encounter: Secondary | ICD-10-CM

## 2018-03-09 LAB — GLUCOSE, CAPILLARY
GLUCOSE-CAPILLARY: 144 mg/dL — AB (ref 70–99)
GLUCOSE-CAPILLARY: 84 mg/dL (ref 70–99)
Glucose-Capillary: 81 mg/dL (ref 70–99)
Glucose-Capillary: 114 mg/dL — ABNORMAL HIGH (ref 70–99)
Glucose-Capillary: 99 mg/dL (ref 70–99)
Glucose-Capillary: 119 mg/dL — ABNORMAL HIGH (ref 70–99)

## 2018-03-09 LAB — BASIC METABOLIC PANEL
ANION GAP: 8 (ref 5–15)
BUN: 37 mg/dL — ABNORMAL HIGH (ref 8–23)
CHLORIDE: 101 mmol/L (ref 98–111)
CO2: 31 mmol/L (ref 22–32)
Calcium: 8.9 mg/dL (ref 8.9–10.3)
Creatinine, Ser: 3.16 mg/dL — ABNORMAL HIGH (ref 0.44–1.00)
GFR calc non Af Amer: 14 mL/min — ABNORMAL LOW (ref >60)
GFR, EST AFRICAN AMERICAN: 16 mL/min — AB (ref >60)
Glucose, Bld: 93 mg/dL (ref 70–99)
POTASSIUM: 3.6 mmol/L (ref 3.5–5.1)
Sodium: 140 mmol/L (ref 135–145)

## 2018-03-09 LAB — BLOOD GAS, ARTERIAL
ACID-BASE DEFICIT: 0.1 mmol/L (ref 0.0–2.0)
Bicarbonate: 23.6 mmol/L (ref 20.0–28.0)
FIO2: 0.4
O2 Saturation: 99.1 %
PEEP: 5 cmH2O
PH ART: 7.45 (ref 7.350–7.450)
Patient temperature: 37
RATE: 12 resp/min
VT: 400 mL
pCO2 arterial: 34 mmHg (ref 32.0–48.0)
pO2, Arterial: 132 mmHg — ABNORMAL HIGH (ref 83.0–108.0)

## 2018-03-09 MED ORDER — BRIMONIDINE TARTRATE 0.2 % OP SOLN
1.0000 [drp] | Freq: Two times a day (BID) | OPHTHALMIC | Status: DC
  Administered 2018-03-09 – 2018-03-10 (×2): 1 [drp] via OPHTHALMIC
  Filled 2018-03-09: qty 5

## 2018-03-09 MED ORDER — TIMOLOL MALEATE 0.5 % OP SOLN
1.0000 [drp] | Freq: Two times a day (BID) | OPHTHALMIC | Status: DC
  Administered 2018-03-09 – 2018-03-10 (×2): 1 [drp] via OPHTHALMIC
  Filled 2018-03-09: qty 5

## 2018-03-09 MED ORDER — NEPRO/CARBSTEADY PO LIQD
237.0000 mL | Freq: Two times a day (BID) | ORAL | Status: DC
  Administered 2018-03-09 – 2018-03-10 (×3): 237 mL via ORAL

## 2018-03-09 MED ORDER — LORAZEPAM 2 MG/ML IJ SOLN
1.0000 mg | INTRAMUSCULAR | Status: AC
  Administered 2018-03-09: 1 mg via INTRAVENOUS
  Filled 2018-03-09: qty 1

## 2018-03-09 NOTE — Progress Notes (Signed)
Nutrition Follow-up  DOCUMENTATION CODES:   Not applicable  INTERVENTION:  Provide Nepro Shake po BID, each supplement provides 425 kcal and 19 grams protein.  NUTRITION DIAGNOSIS:   Inadequate oral intake related to inability to eat as evidenced by NPO status.  Resolving with diet advancement today.  GOAL:   Patient will meet greater than or equal to 90% of their needs  Progressing.  MONITOR:   I & O's, Weight trends  REASON FOR ASSESSMENT:   Ventilator    ASSESSMENT:   Ms. Danner has a PMH CKD on HD MWF, who was found with pills scattered around her presents with drug overdose requiring intubation for airway protection.   -Patient was extubated on 8/19. -Following SLP evaluation today diet was advanced to dysphagia 3 with thin liquids. -Next HD will be tomorrow.  Met with patient and her family members at bedside. Safety sitter also present. Patient reports she is hungry and ready to eat today. She reports she had a good appetite and intake PTA. She reports her weight was stable PTA. She is amenable to drinking ONS to help make sure she is meeting calorie/protein needs. Did not note until after completing follow-up that patient never had a Nutrition-Focused Physical Exam completed on her initial assessment. Will obtain on next follow-up.  Medications reviewed and include: ceftriaxone, famotidine.  Labs reviewed: CBG 81-114, BUN 37, Creatinine 3.16.  Discussed with RN and on rounds.  Diet Order:   Diet Order            DIET DYS 3 Room service appropriate? Yes with Assist; Fluid consistency: Thin  Diet effective now              EDUCATION NEEDS:   Not appropriate for education at this time  Skin:  Skin Assessment: Reviewed RN Assessment  Last BM:  03/09/2018 - large type 5  Height:   Ht Readings from Last 1 Encounters:  03/04/18 '5\' 3"'$  (1.6 m)    Weight:   Wt Readings from Last 1 Encounters:  03/09/18 66.3 kg    Ideal Body Weight:  52.27  kg  BMI:  Body mass index is 25.89 kg/m.  Estimated Nutritional Needs:   Kcal:  1660-1990 (25-30 kcal/kg)  Protein:  84-105 grams (1.2-1.5g/kg)  Fluid:  UOP +1L  Willey Blade, MS, RD, LDN Office: [phone] Pager: [phone] After Hours/Weekend Pager: [phone]

## 2018-03-09 NOTE — Progress Notes (Signed)
Chaplain received OR for Advance Directive went to patient room, they were moved to 129. Chaplain went to new room and patient was having therapy. Chaplain said ok and family member shut the door.

## 2018-03-09 NOTE — Progress Notes (Signed)
Report will be given to [facility]. Orders to tx to floor, room 125. Patient will be on tele monitoring. Patient alert, no complaints of pain, or shortness of breath. Sitter at bedside for safety concerns. Consult entered. Dialysis patient Monday, Wednesday, Friday.

## 2018-03-09 NOTE — Evaluation (Signed)
Clinical/Bedside Swallow Evaluation Patient Details  Name: Joyce [name] MRN: [id number] Date of Birth: Sep 02, 1945  Today's Date: 03/09/2018 Time: SLP Start Time (ACUTE ONLY): 6 SLP Stop Time (ACUTE ONLY): 1150 SLP Time Calculation (min) (ACUTE ONLY): 60 min  Past Medical History:  Past Medical History:  Diagnosis Date  . Anal fissure   . Bipolar affective disorder ([place])   . CKD (chronic kidney disease)    Dr Holley Raring [name] 4  . Colon polyps   . Diverticulitis   . Diverticulitis   . Family history of adverse reaction to anesthesia    mom - PONV  . GERD (gastroesophageal reflux disease)   . Headache    migraines - none over 10 yrs  . Heart murmur   . History of hiatal hernia   . Hypertension   . Pancreatitis    Valproic acid  . Vertigo    Past Surgical History:  Past Surgical History:  Procedure Laterality Date  . A/V FISTULAGRAM Left 04/14/2017   Procedure: A/V Fistulagram;  Surgeon: Katha Cabal, MD;  Location: [facility];  Service: Cardiovascular;  Laterality: Left;  . A/V FISTULAGRAM Left 06/09/2017   Procedure: A/V FISTULAGRAM;  Surgeon: Katha Cabal, MD;  Location: [facility];  Service: Cardiovascular;  Laterality: Left;  . A/V FISTULAGRAM Left 11/02/2017   Procedure: A/V FISTULAGRAM;  Surgeon: Algernon Huxley, MD;  Location: [facility];  Service: Cardiovascular;  Laterality: Left;  . A/V SHUNT INTERVENTION N/A 11/02/2017   Procedure: A/V SHUNT INTERVENTION;  Surgeon: Algernon Huxley, MD;  Location: [facility];  Service: Cardiovascular;  Laterality: N/A;  . ABDOMINAL HYSTERECTOMY  1990 ?  . AV FISTULA PLACEMENT  4/30  . BREAST EXCISIONAL BIOPSY Left 1994   neg surgical bx  . CHOLECYSTECTOMY  2003  . COLONOSCOPY  2014   Dr. Jamal Collin  . COLONOSCOPY WITH PROPOFOL N/A 09/24/2015   Procedure: COLONOSCOPY WITH random colon byopies.;  Surgeon: Lucilla Lame, MD;  Location: [facility];  Service: Endoscopy;  Laterality:  N/A;  . ESOPHAGOGASTRODUODENOSCOPY (EGD) WITH PROPOFOL N/A 09/24/2015   Procedure: ESOPHAGOGASTRODUODENOSCOPY (EGD) ;  Surgeon: Lucilla Lame, MD;  Location: [facility];  Service: Endoscopy;  Laterality: N/A;  . EYE SURGERY    . PERIPHERAL VASCULAR CATHETERIZATION N/A 05/29/2015   Procedure: A/V Shuntogram/Fistulagram;  Surgeon: Katha Cabal, MD;  Location: [facility];  Service: Cardiovascular;  Laterality: N/A;  . PERIPHERAL VASCULAR CATHETERIZATION N/A 05/29/2015   Procedure: A/V Shunt Intervention;  Surgeon: Katha Cabal, MD;  Location: [facility];  Service: Cardiovascular;  Laterality: N/A;  . POLYPECTOMY  09/24/2015   Procedure: POLYPECTOMY INTESTINAL;  Surgeon: Lucilla Lame, MD;  Location: [facility];  Service: Endoscopy;;  cecal polyp ascending polyp   HPI:  Pt is an 72 y.o. female. with a past medical history remarkable for multiple medical issues including Bipolar disorder, chronic renal failure, on hemodialysis, Monday once a Friday, who presented to [facility] ED on 03/04/18 after being found unresponsive by her family. The patient was found with pills scattered around her in the am 8/15; concern for overdose - Psychiatry is following.  Family had last talked to the patient at 1700 on 03/03/18, of which she seemed in normal spirits.  It is noted that she was discharged from [facility] on 03/03/18.  Pt denied any swallowing problems prior.  Pt was orally intubated for ~5 days post admission for airway protection;  extubated on the 19th.  Currently, verbally conversive w/ distraction but easily reattended to tasks w/ verbal cues.  Pt admitted w/ encephalopathy per MD notes.    Assessment / Plan / Recommendation Clinical Impression  Pt appears to present w/ adequate oropharyngeal phase swallow function w/ reduced risk for aspiration when following general aspiration precautions. Pt does min involuntary head movements during tasks but this  calms during po trials. Pt consumed trials of thin liquids VIA CUP, puree and soft solids w/ no overt s/s of aspiration noted; clear vocal quality post trials and no decline in respiratory status during/post trials(O2 sats remained 99-100%). Single swallows noted. During Oral phase, pt exhibited bolus management/mastication and oral clearing of the food/liquid trials; timely A-P transfer w/ boluses. Pt needed min time b/t trials to regulate breathing and self w/ the exertion of the po tasks. Educated pt on eating foods that are more moist, soft. Encouraged Soups, less tougher meats. OM exam was [facility] for movements/strength/cough effort. Pt fed self w/ setup. Recommend a Mech Soft diet w/ thin liquids, aspiration precautions, Pills in Puree for easier swallowing. Feeding Support at meals while weak; tray setup. ST services will be available for further education as needed while admitted. NSG updated.  SLP Visit Diagnosis: Dysphagia, unspecified (R13.10)(recent oral intubation/extubation)    Aspiration Risk  (reduced following precautions)    Diet Recommendation  Mech Soft diet w/ Thin liquids; general aspiration precautions. Tray setup at meals.   Medication Administration: Whole meds with puree(for safer swallowing)    Other  Recommendations Recommended Consults: (Dietician f/u) Oral Care Recommendations: Oral care BID;Patient independent with oral care Other Recommendations: (n/a)   Follow up Recommendations None      Frequency and Duration (n/a)  (n/a)       Prognosis Prognosis for Safe Diet Advancement: Good Barriers to Reach Goals: (deconditioning)      Swallow Study   General Date of Onset: 03/04/18 HPI: Pt is an 72 y.o. female. with a past medical history remarkable for multiple medical issues including Bipolar disorder, chronic renal failure, on hemodialysis, Monday once a Friday, who presented to [facility] ED on 03/04/18 after being found unresponsive by her family. The patient was found  with pills scattered around her in the am 8/15; concern for overdose - Psychiatry is following.  Family had last talked to the patient at 1700 on 03/03/18, of which she seemed in normal spirits.  It is noted that she was discharged from [facility] on 03/03/18.  Pt denied any swallowing problems prior.  Pt was orally intubated for ~5 days post admission for airway protection; extubated on the 19th.  Currently, verbally conversive w/ distraction but easily reattended to tasks w/ verbal cues.  Pt admitted w/ encephalopathy per MD notes.  Type of Study: Bedside Swallow Evaluation Previous Swallow Assessment: none reported Diet Prior to this Study: NPO(regular diet at home) Temperature Spikes Noted: No(wbc 9.0) Respiratory Status: Nasal cannula(4 liters) History of Recent Intubation: Yes Length of Intubations (days): 5 days Date extubated: 03/08/18 Behavior/Cognition: Alert;Cooperative;Pleasant mood;Distractible;Requires cueing Oral Cavity Assessment: Within Functional Limits Oral Care Completed by SLP: Recent completion by staff Oral Cavity - Dentition: Adequate natural dentition;Missing dentition(few) Vision: Functional for self-feeding Self-Feeding Abilities: Able to feed self;Needs assist;Needs set up Patient Positioning: Upright in bed Baseline Vocal Quality: (min [place] but WFL) Volitional Cough: Strong(min congested) Volitional Swallow: Able to elicit    Oral/Motor/Sensory Function Overall Oral Motor/Sensory Function: Within functional limits   Ice Chips Ice chips: Within  functional limits Presentation: Spoon(fed; 5 trials) Other Comments: pt enjoyed them she stated   Thin Liquid Thin Liquid: Within functional limits Presentation: Cup;Self Fed(10 trials)    Nectar Thick Nectar Thick Liquid: Not tested   Honey Thick Honey Thick Liquid: Not tested   Puree Puree: Within functional limits Presentation: Self Fed;Spoon(assisted; 8 trials)   Solid     Solid: Within  functional limits(grossly WFL w/ soft solids; 5 trials) Presentation: Self Fed Other Comments: pt needed min time b/t trials to regulate breathing and self w/ the exertion       Orinda Kenner, MS, CCC-SLP Cornelius Marullo 03/09/2018,3:28 PM

## 2018-03-09 NOTE — Progress Notes (Signed)
[facility]  ROUNDING NOTE   Subjective:  Patient now extubated.   Hoarseness of voice noted.  Had HD yesterday.     Objective:  Vital signs in last 24 hours:  Temp:  [98 F (36.7 C)-99 F (37.2 C)] 98 F (36.7 C) (08/20 0400) Pulse Rate:  [60-134] 104 (08/20 0645) Resp:  [10-27] 19 (08/20 0645) BP: (99-187)/(51-119) 152/86 (08/20 0645) SpO2:  [96 %-100 %] 100 % (08/20 0645) FiO2 (%):  [24 %] 24 % (08/19 1527) Weight:  [66.3 kg-67.2 kg] 66.3 kg (08/20 0400)  Weight change: 0 kg Filed Weights   03/08/18 1155 03/08/18 1603 03/09/18 0400  Weight: 66.4 kg 67.2 kg 66.3 kg    Intake/Output: I/O last 3 completed shifts: In: 406.7 [I.V.:226.7; IV Piggyback:180] Out: 871 [Urine:775; Emesis/NG output:220]   Intake/Output this shift:  No intake/output data recorded.  Physical Exam: General: No acute distress  Head: [place]/AT hearing intact  Eyes: Anicteric  Neck: Supple  Lungs:  Scattered rhonchi, normal effort  Heart: S1S2 no rubs  Abdomen:  Soft, nontender, BS present  Extremities: Trace peripheral edema.  Neurologic: Awake, alert, follows commands  Skin: No lesions  Access: Left AVF    Basic Metabolic Panel: Recent Labs  Lab 03/02/18 1013 03/04/18 1053 03/04/18 1341 03/05/18 0203 03/06/18 0815 03/08/18 0453 03/09/18 0501  NA 134* 135  --  136 141 146* 140  K 3.8 4.5  --  4.8 3.9 3.8 3.6  CL 93* 100  --  103 104 106 101  CO2 32 27  --  _0 GLUCOSE 93 127*  --  122* 141* 103* 93  BUN 30* 18  --  22 24* 70* 37*  CREATININE 4.62* 3.85*  --  4.26* 3.47* 5.42* 3.16*  CALCIUM 9.0 8.4*  --  8.7* 9.1 8.9 8.9  MG  --   --  2.0 1.9  --  2.9*  --   PHOS 4.0  --  3.5 5.2*  --  4.6  --     Liver Function Tests: Recent Labs  Lab 03/02/18 1013 03/04/18 1053  AST  --  21  ALT  --  15  ALKPHOS  --  66  BILITOT  --  0.7  PROT  --  5.7*  ALBUMIN 3.8 3.4*   No results for input(s): LIPASE, AMYLASE in the last 168 hours. No results for  input(s): AMMONIA in the last 168 hours.  CBC: Recent Labs  Lab 03/02/18 1013 03/04/18 1053 03/05/18 0203  WBC 7.4 6.7 9.7  HGB 10.9* 9.8* 10.7*  HCT 30.8* 27.2* 31.2*  MCV 96.6 95.6 98.3  PLT 133* 142* 158    Cardiac Enzymes: Recent Labs  Lab 03/05/18 1817 03/08/18 0453  TROPONINI 0.05* 0.06*    BNP: Invalid input(s): POCBNP  CBG: Recent Labs  Lab 03/08/18 1618 03/08/18 2020 03/08/18 2317 03/09/18 0456 03/09/18 0743  GLUCAP 83 82 99 81 84    Microbiology: Results for orders placed or performed during the hospital encounter of 03/04/18  Urine culture     Status: None   Collection Time: 03/04/18 11:03 AM  Result Value Ref Range Status   Specimen Description   Final    URINE, RANDOM Performed at [facility], [address], [place], [place] [address]    Special Requests   Final    NONE Performed at [facility], [address], [place], [place] [address]    Culture   Final  NO GROWTH Performed at [facility], [place] [address], [place], [place] [address]    Report Status 03/06/2018 FINAL  Final  Blood Cultures (routine x 2)     Status: Abnormal   Collection Time: 03/04/18 11:18 AM  Result Value Ref Range Status   Specimen Description   Final    BLOOD RIGHT ARM Performed at [facility], [place] [address], [place], [place] [address]    Special Requests   Final    BOTTLES DRAWN AEROBIC AND ANAEROBIC Blood Culture adequate volume Performed at [facility], [address] [place], [place] [address]    Culture  Setup Time   Final    GRAM POSITIVE COCCI AEROBIC BOTTLE ONLY CRITICAL RESULT CALLED TO, READ BACK BY AND VERIFIED WITH: DAVID BESANTI ON 03/05/18 AT 0109 JAG    Culture (A)  Final    VIRIDANS STREPTOCOCCUS THE SIGNIFICANCE OF ISOLATING THIS ORGANISM FROM A SINGLE SET OF BLOOD CULTURES WHEN MULTIPLE SETS ARE DRAWN IS UNCERTAIN. PLEASE NOTIFY THE MICROBIOLOGY DEPARTMENT WITHIN ONE WEEK IF SPECIATION AND  SENSITIVITIES ARE REQUIRED. Performed at [facility], [place] [address] [place], [place] [address]    Report Status 03/07/2018 FINAL  Final  Blood Culture ID Panel (Reflexed)     Status: Abnormal   Collection Time: 03/04/18 11:18 AM  Result Value Ref Range Status   Enterococcus species NOT DETECTED NOT DETECTED Final   Listeria monocytogenes NOT DETECTED NOT DETECTED Final   Staphylococcus species NOT DETECTED NOT DETECTED Final   Staphylococcus aureus NOT DETECTED NOT DETECTED Final   Streptococcus species DETECTED (A) NOT DETECTED Final    Comment: Not Enterococcus species, Streptococcus agalactiae, Streptococcus pyogenes, or Streptococcus pneumoniae. CRITICAL RESULT CALLED TO, READ BACK BY AND VERIFIED WITH: DAVID BESANTI ON 03/05/18 AT 0109 BY JAG    Streptococcus agalactiae NOT DETECTED NOT DETECTED Final   Streptococcus pneumoniae NOT DETECTED NOT DETECTED Final   Streptococcus pyogenes NOT DETECTED NOT DETECTED Final   Acinetobacter baumannii NOT DETECTED NOT DETECTED Final   Enterobacteriaceae species NOT DETECTED NOT DETECTED Final   Enterobacter cloacae complex NOT DETECTED NOT DETECTED Final   Escherichia coli NOT DETECTED NOT DETECTED Final   Klebsiella oxytoca NOT DETECTED NOT DETECTED Final   Klebsiella pneumoniae NOT DETECTED NOT DETECTED Final   Proteus species NOT DETECTED NOT DETECTED Final   Serratia marcescens NOT DETECTED NOT DETECTED Final   Haemophilus influenzae NOT DETECTED NOT DETECTED Final   Neisseria meningitidis NOT DETECTED NOT DETECTED Final   Pseudomonas aeruginosa NOT DETECTED NOT DETECTED Final   Candida albicans NOT DETECTED NOT DETECTED Final   Candida glabrata NOT DETECTED NOT DETECTED Final   Candida krusei NOT DETECTED NOT DETECTED Final   Candida parapsilosis NOT DETECTED NOT DETECTED Final   Candida tropicalis NOT DETECTED NOT DETECTED Final    Comment: Performed at [facility], [address] [place], [place] [address]   MRSA PCR Screening     Status: None   Collection Time: 03/04/18 12:37 PM  Result Value Ref Range Status   MRSA by PCR NEGATIVE NEGATIVE Final    Comment:        The GeneXpert MRSA Assay (FDA approved for NASAL specimens only), is one component of a comprehensive MRSA colonization surveillance program. It is not intended to diagnose MRSA infection nor to guide or monitor treatment for MRSA infections. Performed at [facility], [address], [place], [place] [address]   Culture, blood (Routine  X 2) w Reflex to ID Panel     Status: None (Preliminary result)   Collection Time: 03/05/18  2:02 AM  Result Value Ref Range Status   Specimen Description BLOOD RIGHT HAND  Final   Special Requests   Final    BOTTLES DRAWN AEROBIC ONLY Blood Culture results may not be optimal due to an inadequate volume of blood received in culture bottles   Culture   Final    NO GROWTH 4 DAYS Performed at [facility], [address], [place], [place] [address]    Report Status PENDING  Incomplete  Culture, blood (Routine X 2) w Reflex to ID Panel     Status: None (Preliminary result)   Collection Time: 03/05/18  2:03 AM  Result Value Ref Range Status   Specimen Description BLOOD RIGHT HAND  Final   Special Requests   Final    BOTTLES DRAWN AEROBIC ONLY Blood Culture results may not be optimal due to an inadequate volume of blood received in culture bottles   Culture   Final    NO GROWTH 4 DAYS Performed at [facility], [address] [place], [place] [address]    Report Status PENDING  Incomplete    Coagulation Studies: No results for input(s): LABPROT, INR in the last 72 hours.  Urinalysis: Recent Labs    03/07/18 1641  COLORURINE AMBER*  LABSPEC 1.011  PHURINE 5.0  GLUCOSEU NEGATIVE  HGBUR MODERATE*  BILIRUBINUR NEGATIVE  KETONESUR NEGATIVE  PROTEINUR 30*  NITRITE NEGATIVE  LEUKOCYTESUR MODERATE*      Imaging: Dg Chest Port 1 View  Result  Date: 03/07/2018 CLINICAL DATA:  Ventilated patient. EXAM: PORTABLE CHEST 1 VIEW COMPARISON:  1 day prior FINDINGS: Endotracheal tube terminates 2.5 cm above carina.Nasogastric tube extends beyond the inferior aspect of the film. Numerous leads and wires project over the chest. Patient rotated left. Mild cardiomegaly. Atherosclerosis in the transverse aorta. Possible small left pleural effusion. No pneumothorax. No congestive failure. Persistent left hemidiaphragm elevation with left base atelectasis. Left axillary stent. IMPRESSION: No significant change since one day prior. Cardiomegaly with low lung volumes and left base atelectasis. Possible small left pleural effusion. Electronically Signed   By: Abigail Miyamoto M.D.   On: 03/07/2018 11:10     Medications:   . cefTRIAXone (ROCEPHIN)  IV Stopped (03/08/18 1110)  . dexmedetomidine (PRECEDEX) IV infusion Stopped (03/09/18 0034)  . dextrose 30 mL/hr at 03/08/18 2137  . famotidine (PEPCID) IV Stopped (03/08/18 1110)   . chlorhexidine gluconate (MEDLINE KIT)  15 mL Mouth Rinse BID  . Chlorhexidine Gluconate Cloth  6 each Topical Q0600  . epoetin (EPOGEN/PROCRIT) injection  4,000 Units Intravenous Q M,W,F-HD  . heparin  5,000 Units Subcutaneous Q8H  . mouth rinse  15 mL Mouth Rinse 10 times per day   acetaminophen **OR** acetaminophen, hydrALAZINE, ipratropium-albuterol, [DISCONTINUED] ondansetron **OR** ondansetron (ZOFRAN) IV, pentafluoroprop-tetrafluoroeth  Assessment/ Plan:  [name] is a 72 y.o. white female with end stage renal disease on hemodialysis, hypertension, bipolar disorder, GERD, polycystic kidney disease  [facility] MWF left AVF  1. End Stage Renal Disease  -Patient completed dialysis yesterday.  No acute indication for dialysis today.  Next dialysis will be for tomorrow.  2.  Acute Resp failure -Patient extubated March 08, 2018.  Breathing comfortably at the moment.  3. Anemia of chronic  kidney disease: Continue Epogen with dialysis.  4.  Altered mental status Awake, alert, following commands this a.m.  Much improved as compared to admission.       LOS: 5 Joyce [name] 8/20/20197:47 AM

## 2018-03-09 NOTE — Progress Notes (Addendum)
Follow up - Critical Care Medicine Note  Patient Details:    Joyce [name] is an 72 y.o. female. with a past medical history Remarkable for bipolar disorder, chronic renal failure, on hemodialysis, Monday once a Friday,, who presented to [facility] ED on 03/04/18 after being found unresponsive by her mother. The patient was found with pills scattered around her in the am 8/15.  Family had last talked to the patient at 1700 on 03/03/18, of which she seemed in normal spirits.   At this time, it is unclear what pills she ingested, and whether this was an intentional or unintentional overdose. It is noted that she was discharged from [facility] ward yesterday 03/03/18.  Review of home medications indicates she takes Carbamazepine, Haloperidol, Temazepam, and Ropinirole  In the ED, she was intubated for airway protection.  Lines, Airways, Drains: Airway (Active)     Airway (Active)     Airway (Active)     Airway 7.5 mm (Active)  Secured at (cm) 24 cm 03/05/2018  7:19 AM  Measured From Lips 03/05/2018  7:19 AM  Secured Location Left 03/05/2018  7:19 AM  Secured By [employer] 03/05/2018  7:19 AM  Tube Holder Repositioned Yes 03/05/2018  4:10 AM  Cuff Pressure (cm H2O) 25 cm H2O 03/04/2018  7:25 PM  Site Condition Dry 03/05/2018  7:19 AM     NG/OG Tube Orogastric 14 Fr. Center mouth Aucultation Measured external length of tube (Active)  External Length of Tube (cm) - (if applicable) 55 cm 8/41/3244  4:00 PM  Site Assessment Clean;Dry 03/05/2018  4:10 AM  Ongoing Placement Verification No acute changes, not attributed to clinical condition;No change in respiratory status 03/04/2018  4:00 PM  Status Clamped 03/04/2018  7:40 PM  Amount of suction 100 mmHg 03/04/2018  4:00 PM  Drainage Appearance [place];Clear 03/04/2018  4:00 PM  Output (mL) 30 mL 03/05/2018  5:00 AM     Urethral Catheter Cassie, RN 14 Fr. (Active)  Indication for Insertion or Continuance of Catheter Unstable critical patients  (first 24-48 hours) 03/05/2018  4:10 AM  Site Assessment Clean;Intact 03/05/2018  4:10 AM  Catheter Maintenance Bag below level of bladder;Drainage bag/tubing not touching floor;Catheter secured;No dependent loops;Seal intact 03/05/2018  4:10 AM  Collection Container Standard drainage bag 03/05/2018  4:10 AM  Securement Method Securing device (Describe) 03/05/2018  4:10 AM  Urinary Catheter Interventions Unclamped 03/05/2018  4:10 AM  Output (mL) 175 mL 03/04/2018  6:05 PM    Anti-infectives:  Anti-infectives (From admission, onward)   Start     Dose/Rate Route Frequency Ordered Stop   03/05/18 0900  cefTRIAXone (ROCEPHIN) 2 g in sodium chloride 0.9 % 100 mL IVPB     2 g 200 mL/hr over 30 Minutes Intravenous Daily 03/05/18 0849     03/05/18 0830  vancomycin (VANCOCIN) IVPB 1000 mg/200 mL premix  Status:  Discontinued     1,000 mg 200 mL/hr over 60 Minutes Intravenous  Once 03/05/18 0102 03/05/18 0849      Microbiology: Results for orders placed or performed during the hospital encounter of 03/04/18  Urine culture     Status: None   Collection Time: 03/04/18 11:03 AM  Result Value Ref Range Status   Specimen Description   Final    URINE, RANDOM Performed at [facility], [address] [place], [place] [address]    Special Requests   Final    NONE Performed at [facility], [address] [place], [place]  [address]    Culture   Final    NO GROWTH Performed at [facility], [place] [address] [place], [place] [address]    Report Status 03/06/2018 FINAL  Final  Blood Cultures (routine x 2)     Status: Abnormal   Collection Time: 03/04/18 11:18 AM  Result Value Ref Range Status   Specimen Description   Final    BLOOD RIGHT ARM Performed at [facility], [place] [address], [place], [place] [address]    Special Requests   Final    BOTTLES DRAWN AEROBIC AND ANAEROBIC Blood Culture adequate volume Performed at [facility], [address]  [place], [place] [address]    Culture  Setup Time   Final    GRAM POSITIVE COCCI AEROBIC BOTTLE ONLY CRITICAL RESULT CALLED TO, READ BACK BY AND VERIFIED WITH: DAVID BESANTI ON 03/05/18 AT 0109 JAG    Culture (A)  Final    VIRIDANS STREPTOCOCCUS THE SIGNIFICANCE OF ISOLATING THIS ORGANISM FROM A SINGLE SET OF BLOOD CULTURES WHEN MULTIPLE SETS ARE DRAWN IS UNCERTAIN. PLEASE NOTIFY THE MICROBIOLOGY DEPARTMENT WITHIN ONE WEEK IF SPECIATION AND SENSITIVITIES ARE REQUIRED. Performed at [facility], [place] [address], [place], [place] [address]    Report Status 03/07/2018 FINAL  Final  Blood Culture ID Panel (Reflexed)     Status: Abnormal   Collection Time: 03/04/18 11:18 AM  Result Value Ref Range Status   Enterococcus species NOT DETECTED NOT DETECTED Final   Listeria monocytogenes NOT DETECTED NOT DETECTED Final   Staphylococcus species NOT DETECTED NOT DETECTED Final   Staphylococcus aureus NOT DETECTED NOT DETECTED Final   Streptococcus species DETECTED (A) NOT DETECTED Final    Comment: Not Enterococcus species, Streptococcus agalactiae, Streptococcus pyogenes, or Streptococcus pneumoniae. CRITICAL RESULT CALLED TO, READ BACK BY AND VERIFIED WITH: DAVID BESANTI ON 03/05/18 AT 0109 BY JAG    Streptococcus agalactiae NOT DETECTED NOT DETECTED Final   Streptococcus pneumoniae NOT DETECTED NOT DETECTED Final   Streptococcus pyogenes NOT DETECTED NOT DETECTED Final   Acinetobacter baumannii NOT DETECTED NOT DETECTED Final   Enterobacteriaceae species NOT DETECTED NOT DETECTED Final   Enterobacter cloacae complex NOT DETECTED NOT DETECTED Final   Escherichia coli NOT DETECTED NOT DETECTED Final   Klebsiella oxytoca NOT DETECTED NOT DETECTED Final   Klebsiella pneumoniae NOT DETECTED NOT DETECTED Final   Proteus species NOT DETECTED NOT DETECTED Final   Serratia marcescens NOT DETECTED NOT DETECTED Final   Haemophilus influenzae NOT DETECTED NOT DETECTED Final   Neisseria meningitidis  NOT DETECTED NOT DETECTED Final   Pseudomonas aeruginosa NOT DETECTED NOT DETECTED Final   Candida albicans NOT DETECTED NOT DETECTED Final   Candida glabrata NOT DETECTED NOT DETECTED Final   Candida krusei NOT DETECTED NOT DETECTED Final   Candida parapsilosis NOT DETECTED NOT DETECTED Final   Candida tropicalis NOT DETECTED NOT DETECTED Final    Comment: Performed at [facility], [address] [place], [place] [address]  MRSA PCR Screening     Status: None   Collection Time: 03/04/18 12:37 PM  Result Value Ref Range Status   MRSA by PCR NEGATIVE NEGATIVE Final    Comment:        The GeneXpert MRSA Assay (FDA approved for NASAL specimens only), is one component of a comprehensive MRSA colonization surveillance program. It is not intended to diagnose MRSA infection nor to guide or monitor treatment for MRSA infections. Performed at [facility], [phone] [place], [place] [address]   Culture, blood (Routine X 2) w Reflex to ID Panel     Status: None (Preliminary result)   Collection Time: 03/05/18  2:02 AM  Result Value Ref Range Status   Specimen Description BLOOD RIGHT HAND  Final   Special Requests   Final    BOTTLES DRAWN AEROBIC ONLY Blood Culture results may not be optimal due to an inadequate volume of blood received in culture bottles   Culture   Final    NO GROWTH 4 DAYS Performed at [facility], [address], [place], [place] [address]    Report Status PENDING  Incomplete  Culture, blood (Routine X 2) w Reflex to ID Panel     Status: None (Preliminary result)   Collection Time: 03/05/18  2:03 AM  Result Value Ref Range Status   Specimen Description BLOOD RIGHT HAND  Final   Special Requests   Final    BOTTLES DRAWN AEROBIC ONLY Blood Culture results may not be optimal due to an inadequate volume of blood received in culture bottles   Culture   Final    NO GROWTH 4 DAYS Performed at [facility], [address] [place],  [address]    Report Status PENDING  Incomplete   Studies: Dg Abdomen 1 View  Result Date: 03/04/2018 CLINICAL DATA:  OG tube advancement. EXAM: ABDOMEN - 1 VIEW COMPARISON:  CT scan March 11, 2017.  Chest x-ray March 04, 2018. FINDINGS: The side port of the OG tube is near the GE junction with the tip in the fundus. The patient may benefit from advancing the tube another 8 cm or so. Cholecystectomy clips. Rounded opacities project over the right upper quadrant, nonspecific. No other acute abnormalities. IMPRESSION: 1. The side port of the OG tube is probably just above the GE junction. The distal tip is within the stomach. Consider advancing up to 8 cm. 2. Rounded radiopaque densities project over the right upper quadrant and could be something on the patient. The patient is status post cholecystectomy which would exclude gallstones. No stones were seen in the right kidney on the previous CT scan. Recommend clinical correlation. Electronically Signed   By: Dorise Bullion III M.D   On: 03/04/2018 11:47   Ct Head Wo Contrast  Result Date: 03/05/2018 CLINICAL DATA:  Patient found down yesterday. Possible drug overdose. EXAM: CT HEAD WITHOUT CONTRAST TECHNIQUE: Contiguous axial images were obtained from the base of the skull through the vertex without intravenous contrast. COMPARISON:  Head CT scan 09/22/2017.  Brain MRI 09/02/2017. FINDINGS: Brain: No evidence of acute infarction, hemorrhage, hydrocephalus, extra-axial collection or mass lesion/mass effect. Cortical atrophy is noted. Vascular: No hyperdense vessel or unexpected calcification. Skull: Intact.  No focal lesion. Sinuses/Orbits: No acute or focal abnormality. Status post lens extraction. Other: None. IMPRESSION: No acute abnormality. Cortical atrophy. Electronically Signed   By: Inge Rise M.D.   On: 03/05/2018 11:22   Mr Brain Wo Contrast  Result Date: 03/06/2018 CLINICAL DATA:  Initial evaluation for acute altered  mental status, encephalopathy. EXAM: MRI HEAD WITHOUT CONTRAST TECHNIQUE: Multiplanar, multiecho pulse sequences of the brain and surrounding structures were obtained without intravenous contrast. COMPARISON:  Prior CT from 03/05/2018. FINDINGS: Brain: Generalized age-related cerebral atrophy. Few scattered T2/FLAIR hyperintensities noted within the periventricular deep white matter both cerebral hemispheres, nonspecific, but mild for age. No evidence for acute or subacute infarct. Gray-white matter differentiation maintained. No areas of remote cortical infarction. No  acute or chronic intracranial hemorrhage. No mass lesion, midline shift or mass effect. No hydrocephalus. No extra-axial fluid collection. Normal pituitary gland. Vascular: Major intracranial vascular flow voids are maintained Skull and upper cervical spine: Craniocervical junction normal. Upper cervical spine normal. No focal marrow replacing lesion. Scalp soft tissues unremarkable. Sinuses/Orbits: Globes and orbital soft tissues demonstrate no acute finding. Patient status post ocular lens replacement bilaterally. Paranasal sinuses largely clear. Fluid within the nasopharynx. Patient appears to be intubated. No significant mastoid effusion. Inner ear structures grossly normal. Other: None. IMPRESSION: 1. No acute intracranial abnormality. 2. Age-related cerebral atrophy with mild bile specific cerebral white matter changes. Electronically Signed   By: Jeannine Boga M.D.   On: 03/06/2018 22:46   Dg Chest Port 1 View  Result Date: 03/07/2018 CLINICAL DATA:  Ventilated patient. EXAM: PORTABLE CHEST 1 VIEW COMPARISON:  1 day prior FINDINGS: Endotracheal tube terminates 2.5 cm above carina.Nasogastric tube extends beyond the inferior aspect of the film. Numerous leads and wires project over the chest. Patient rotated left. Mild cardiomegaly. Atherosclerosis in the transverse aorta. Possible small left pleural effusion. No pneumothorax. No  congestive failure. Persistent left hemidiaphragm elevation with left base atelectasis. Left axillary stent. IMPRESSION: No significant change since one day prior. Cardiomegaly with low lung volumes and left base atelectasis. Possible small left pleural effusion. Electronically Signed   By: Abigail Miyamoto M.D.   On: 03/07/2018 11:10   Dg Chest Port 1 View  Result Date: 03/06/2018 CLINICAL DATA:  Ventilatory support, respiratory failure EXAM: PORTABLE CHEST 1 VIEW COMPARISON:  03/05/2018 FINDINGS: Endotracheal tube 2.9 cm above the carina. NG tube enters the stomach with the tip not visualized. Left axillary vascular stents again noted. Stable cardiomegaly without CHF or significant focal airspace process. No significant collapse or consolidation. No enlarging effusion or pneumothorax. Overall slight improvement in aeration. IMPRESSION: Stable support apparatus. Cardiomegaly without CHF Slight improved lung volumes. Electronically Signed   By: Jerilynn Mages.  Shick M.D.   On: 03/06/2018 09:35   Dg Chest Port 1 View  Result Date: 03/05/2018 CLINICAL DATA:  Respiratory failure EXAM: PORTABLE CHEST 1 VIEW COMPARISON:  03/04/2018 FINDINGS: Cardiac shadow is mildly enlarged but stable. Nasogastric catheter is noted within the stomach. Endotracheal tube is noted at the level of the carina. This should be withdrawn 2-3 cm. The lungs are clear. Vascular stent is noted on the left and stable. IMPRESSION: Endotracheal tube at the level of the carina. This should be withdrawn 2-3 cm. No other focal abnormality is noted. Electronically Signed   By: Inez Catalina M.D.   On: 03/05/2018 03:19   Dg Chest Port 1 View  Result Date: 03/04/2018 CLINICAL DATA:  Intubated. EXAM: PORTABLE CHEST 1 VIEW COMPARISON:  12/15/2017. FINDINGS: Interval endotracheal tube with its tip 2 cm above the carina. This could be retracted 2 cm to place it at the level of the clavicles. Interval nasogastric or orogastric tube with its tip at the  gastroesophageal junction and side hole in the distal esophagus. Poor inspiration with stable borderline enlarged cardiac silhouette and clear lungs. Cholecystectomy clips. Left subclavian and axillary stent. Unremarkable bones. IMPRESSION: 1. Endotracheal tube tip 2 cm above the carina. This could be retracted 2 cm to place it at the level of the clavicles. 2. Nasogastric tube tip at the gastroesophageal junction and side hole in the distal esophagus. It is recommended that this be advanced. 3. Stable borderline cardiomegaly. Electronically Signed   By: Claudie Revering M.D.   On: 03/04/2018 11:44  Consults: Treatment Team:  Wilhelmina Mcardle, MD [name], MD Alexis Goodell, MD Tsosie Billing, MD   Subjective:    Overnight Issues: patient had dramatically improved mental status yesterday, subsequently was extubated and tolerated well. This morning is very conversant but somewhat confused.  Objective:  Vital signs for last 24 hours: Temp:  [98 F (36.7 C)-99 F (37.2 C)] 98.3 F (36.8 C) (08/20 0730) Pulse Rate:  [60-134] 102 (08/20 0730) Resp:  [10-27] 18 (08/20 0730) BP: (99-187)/(51-119) 143/60 (08/20 0730) SpO2:  [96 %-100 %] 97 % (08/20 0730) FiO2 (%):  [24 %] 24 % (08/19 1527) Weight:  [66.3 kg-67.2 kg] 66.3 kg (08/20 0400)  Hemodynamic parameters for last 24 hours:    Intake/Output from previous day: 08/19 0701 - 08/20 0700 In: 406.7 [I.V.:226.7; IV Piggyback:180] Out: 276 [Urine:400]  Intake/Output this shift: No intake/output data recorded.  Vent settings for last 24 hours: Vent Mode: PRVC FiO2 (%):  [24 %] 24 % Set Rate:  [12 bmp] 12 bmp Vt Set:  [400 mL] 400 mL PEEP:  [5 cmH20] 5 cmH20  Physical Exam:   General:  patient awake, alert, no acute distress and communicating Neuro:  patient moves all extremities, no focal deficits noted HEENT:  trachea midline, no thyromegaly noted, no jugular venous distention Cardiovascular:  RRR, s1s2, 3/6  murmur Lungs:  Clear bilaterally, no wheezing, even, non-labored, Abdomen:  Soft, non-tender, non-distended, BS+ x4 Musculoskeletal:  No deformities, no edema  Assessment/Plan:   72 y.o. Female found unresponsive 03/04/18 in setting of possible drug overdose requiring intubation for airway protection.  was successfully extubated yesterday without any difficulty. Pending psychiatry consultation. Will transfer to the floor with sitter.   Respiratory failure.Successfully extubated without any difficulty.  End-stage renal disease. This morning's BUN was 37/creatinine 3.16, potassium 3.6 and CO2 31  Positive blood culture. Gram-positive cocci strep species. On rocephin. Appreciate ID input. Surface echo negative. We'll leave on Rocephin.    Summer Mccolgan 03/09/2018 Patient ID: Ivonne Andrew, female   DOB: [birth year], 72 y.o.   MRN: [id number] Patient ID: Tannis Burstein, female   DOB: 28-Jan-1946, 72 y.o.   MRN: [id number] Patient ID: Addylynn Balin, female   DOB: 1945/10/07, 72 y.o.   MRN: [id number] Patient ID: Lesley Atkin, female   DOB: 27-May-1946, 72 y.o.   MRN: [id number]

## 2018-03-09 NOTE — Progress Notes (Signed)
Dr. Weber Cooks made aware that patient is not IVC as he stated in his note.  Per Dr. Weber Cooks this is ok, he is not going to IVC her at this point.  Made him aware that her sitter leaves at 0300 and we may have to put a tele-sitter in room, he stated that would be fine.  Patient states that while she did try to kill herself she is no longer suicidal and regrets trying to take her own life.  Clarise Cruz, RN, BSN

## 2018-03-09 NOTE — Progress Notes (Signed)
[facility] [place] NAME: Joyce [name]    MR#:  [id number]  DATE OF BIRTH:  07/04/1946  SUBJECTIVE:  CHIEF COMPLAINT:   Chief Complaint  Patient presents with  . Other    Unresponsive   Status post extubation on yesterday, recovering well, transferred to the floor, plans for dialysis on tomorrow REVIEW OF SYSTEMS:  CONSTITUTIONAL: No fever, fatigue or weakness.  EYES: No blurred or double vision.  EARS, NOSE, AND THROAT: No tinnitus or ear pain.  RESPIRATORY: No cough, shortness of breath, wheezing or hemoptysis.  CARDIOVASCULAR: No chest pain, orthopnea, edema.  GASTROINTESTINAL: No nausea, vomiting, diarrhea or abdominal pain.  GENITOURINARY: No dysuria, hematuria.  ENDOCRINE: No polyuria, nocturia,  HEMATOLOGY: No anemia, easy bruising or bleeding SKIN: No rash or lesion. MUSCULOSKELETAL: No joint pain or arthritis.   NEUROLOGIC: No tingling, numbness, weakness.  PSYCHIATRY: No anxiety or depression.   ROS  DRUG ALLERGIES:   Allergies  Allergen Reactions  . Morphine And Related Shortness Of Breath    Pt reports chest pain and difficulty breathing.  . Indomethacin Hives  . Pollen Extract Other (See Comments)    Sinus problems and HA    VITALS:  Blood pressure (!) 105/37, pulse (!) 108, temperature 98 F (36.7 C), temperature source Oral, resp. rate 20, height 5\' 3"  (1.6 m), weight 66.3 kg, SpO2 98 %.  PHYSICAL EXAMINATION:  GENERAL:  72 y.o.-year-old patient lying in the bed with no acute distress.  EYES: Pupils equal, round, reactive to light and accommodation. No scleral icterus. Extraocular muscles intact.  HEENT: Head atraumatic, normocephalic. Oropharynx and nasopharynx clear.  NECK:  Supple, no jugular venous distention. No thyroid enlargement, no tenderness.  LUNGS: Normal breath sounds bilaterally, no wheezing, rales,rhonchi or crepitation. No use of accessory muscles of respiration.  CARDIOVASCULAR: S1, S2 normal.  No murmurs, rubs, or gallops.  ABDOMEN: Soft, nontender, nondistended. Bowel sounds present. No organomegaly or mass.  EXTREMITIES: No pedal edema, cyanosis, or clubbing.  NEUROLOGIC: Cranial nerves II through XII are intact. Muscle strength 5/5 in all extremities. Sensation intact. Gait not checked.  PSYCHIATRIC: The patient is alert and oriented x 3.  SKIN: No obvious rash, lesion, or ulcer.   Physical Exam LABORATORY PANEL:   CBC Recent Labs  Lab 03/05/18 0203  WBC 9.7  HGB 10.7*  HCT 31.2*  PLT 158   ------------------------------------------------------------------------------------------------------------------  Chemistries  Recent Labs  Lab 03/04/18 1053  03/08/18 0453 03/09/18 0501  NA 135   < > 146* 140  K 4.5   < > 3.8 3.6  CL 100   < > 106 101  CO2 27   < > 26 31  GLUCOSE 127*   < > 103* 93  BUN 18   < > 70* 37*  CREATININE 3.85*   < > 5.42* 3.16*  CALCIUM 8.4*   < > 8.9 8.9  MG  --    < > 2.9*  --   AST 21  --   --   --   ALT 15  --   --   --   ALKPHOS 66  --   --   --   BILITOT 0.7  --   --   --    < > = values in this interval not displayed.   ------------------------------------------------------------------------------------------------------------------  Cardiac Enzymes Recent Labs  Lab 03/05/18 1817 03/08/18 0453  TROPONINI 0.05* 0.06*   ------------------------------------------------------------------------------------------------------------------  RADIOLOGY:  No results found.  ASSESSMENT  AND PLAN:  72 year old female with past medical history significant for bipolar disorder who was just discharged from [facility] medicine unit on 03/03/2018, history of end-stage renal disease on hemodialysis, hypertension, polycystic kidney disease presents to hospital secondary to an unresponsive episode  *Acute toxic metabolic encephalopathy Resolved Secondary to acute drug overdose/attempted suicide  EEG noted for generalized slowing,  neurology input appreciated, MRI of the brain was a negative study, CT head was a negative study, nursing care PRN, aspiration/fall precautions while in house  *Acute probable drug overdose Dr. Claypacs/psychiatry input appreciated -for inpatient transfer to psychiatry once medically cleared  *End-stage renal disease on dialysis Nephrology input appreciated-for hemodialysis on tomorrow, typically receives MWF Has a left AV fistula  *Acute strep viridans bacteremia Echocardiogram negative for endocarditis Continue empiric Rocephin, follow-up on outstanding cultures Infectious disease input appreciated  *ChronicBipolar disorder Plan of care per psychiatry  *Anemia of chronic disease continue EPO with dialysis.  For transfer to floor later today, once medically cleared will need admission to inpatient psychiatry for continued medical monitoring  All the records are reviewed and case discussed with Care Management/Social Workerr. Management plans discussed with the patient, family and they are in agreement.  CODE STATUS: full  TOTAL TIME TAKING CARE OF THIS PATIENT: 35 minutes.     POSSIBLE D/C IN 1-2 DAYS, DEPENDING ON CLINICAL CONDITION.   Avel Peace Salary M.D on 03/09/2018   Between 7am to 6pm - Pager - [phone]  After 6pm go to [email or web address] - password [email or web address] [facility]  Office  [phone]  CC: Primary care physician; Cletis Athens, MD  Note: This dictation was prepared with Dragon dictation along with smaller phrase technology. Any transcriptional errors that result from this process are unintentional.

## 2018-03-09 NOTE — Consult Note (Signed)
[place] [facility] Psychiatry Consult   Reason for Consult: Consult for this patient with a history of bipolar disorder who was in the hospital with acute encephalopathy requiring intubation Referring Physician: Salary Patient Identification: Blenda Wisecup MRN:  [id number] Principal Diagnosis: Bipolar 1 disorder, depressed ([place]) Diagnosis:   Patient Active Problem List   Diagnosis Date Noted  . Suicide attempt ([place]) [T14.91XA] 03/09/2018  . Acute respiratory failure with hypoxia ([place]) [J96.01] 03/04/2018  . Bipolar I disorder, current or most recent episode manic, with psychotic features ([place]) [F31.2] 02/26/2018  . UTI (urinary tract infection) [N39.0] 12/16/2017  . Bipolar 1 disorder, depressed ([place]) [F31.9] 12/15/2017  . Palliative care encounter [Z51.5]   . Bipolar affective disorder, current episode depressed with psychotic symptoms ([place]) [F31.2] 09/17/2017  . Bipolar I disorder, most recent episode (or current) manic ([place]) [F31.10] 09/02/2017  . Acute delirium [R41.0] 09/02/2017  . Altered mental status [R41.82]   . Acute encephalopathy [G93.40] 09/01/2017  . ESRD on dialysis ([place]) [N18.6, Z99.2] 04/06/2017  . Complication of vascular access for dialysis [T82.9XXA] 04/06/2017  . Hematuria [R31.9] 12/18/2016  . Polycystic kidney [Q61.3] 10/12/2016  . Ruptured cyst of kidney [Q61.00] 10/03/2016  . HTN (hypertension) [I10] 10/01/2016  . Chronic kidney disease [N18.9] 10/01/2016  . Anemia associated with chronic renal failure [N18.9, D63.1] 09/24/2016  . Acute kidney insufficiency [N28.9] 07/28/2016  . Noninfectious diarrhea [K52.9]   . Benign neoplasm of cecum [D12.0]   . Benign neoplasm of ascending colon [D12.2]   . Diarrhea [R19.7]   . Nausea [R11.0]   . Gastric polyp [K31.7]   . Chronic constipation [K59.09] 12/27/2014  . Nausea with vomiting [R11.2] 12/27/2014  . Left sided abdominal pain [R10.9] 12/27/2014  . Personal history of colonic polyps [Z86.010] 05/10/2013     Total Time spent with patient: 1 hour  Subjective:   Joyce [name] is a 72 y.o. female patient admitted with "I know what I did was wrong".  HPI: Patient interviewed chart reviewed.  Patient familiar to me from previous treatment.  This is an older woman with a history of bipolar disorder who was admitted to the hospital after being found by family members unconscious and unarousable at home.  Reports of the scene of the situation and suggested multiple pill bottles around and suspicion was present for overdose.  Patient had been unconscious and on a ventilator up until yesterday.  Patient has now been extubated for almost a day and was able to cooperate with the interview.  She was alert and oriented enough to have a reasonable conversation.  She says after she went home from the hospital last time she initially thought that she was going to be okay but when she got home she saw all the bills that were waiting for her and felt overwhelmed.  She spoke to her pastor and came back home and tried to pray about her situation but continued to feel so depressed and hopeless that she intentionally took an overdose of multiple pills.  Admits to intent to kill herself.  Patient continues to feel depressed although she is not threatening self-harm here in the hospital.  She says that while she was intubated she remembers having hallucinations but does not have any that she has seen today.  Patient has multiple severe life stresses.  Medical history: End-stage renal disease on dialysis.  Has had something of a difficult time adjusting to dialysis.  History of pancreatitis and hypertension and gastric reflux.  Social history: Patient has been  living independently for the most part although after her second to most recent psychiatric hospitalization she went to [place] to stay with her son and daughter-in-law.  Ultimately it sounds like that was not something that she found suitable.  Wound up back in the  hospital but then was discharged back to live on her own independently.  Patient had expressed a desire to live independently but it is clearly too overwhelming for her at this point.  Her sister and her son are both closely involved in her care.  Substance abuse history: None relevant or recent  Past Psychiatric History: Long-standing history of bipolar disorder.  For a long time had been stable as an outpatient seeing Dr. Thurmond Butts.  Taking Tegretol or Trileptal primarily.  Recent initiation of dialysis within the last several months seems to have been a big trigger to her decline in her mood.  Has had a couple of psychiatric hospitalizations recently.  Did have a course of ECT which was effective but also led to some cognitive impairment at the time.  Risk to Self:   Risk to Others:   Prior Inpatient Therapy:   Prior Outpatient Therapy:    Past Medical History:  Past Medical History:  Diagnosis Date  . Anal fissure   . Bipolar affective disorder ([place])   . CKD (chronic kidney disease)    Dr Holley Raring [name] 4  . Colon polyps   . Diverticulitis   . Diverticulitis   . Family history of adverse reaction to anesthesia    mom - PONV  . GERD (gastroesophageal reflux disease)   . Headache    migraines - none over 10 yrs  . Heart murmur   . History of hiatal hernia   . Hypertension   . Pancreatitis    Valproic acid  . Vertigo     Past Surgical History:  Procedure Laterality Date  . A/V FISTULAGRAM Left 04/14/2017   Procedure: A/V Fistulagram;  Surgeon: Katha Cabal, MD;  Location: [facility];  Service: Cardiovascular;  Laterality: Left;  . A/V FISTULAGRAM Left 06/09/2017   Procedure: A/V FISTULAGRAM;  Surgeon: Katha Cabal, MD;  Location: [facility];  Service: Cardiovascular;  Laterality: Left;  . A/V FISTULAGRAM Left 11/02/2017   Procedure: A/V FISTULAGRAM;  Surgeon: Algernon Huxley, MD;  Location: [facility];  Service: Cardiovascular;  Laterality:  Left;  . A/V SHUNT INTERVENTION N/A 11/02/2017   Procedure: A/V SHUNT INTERVENTION;  Surgeon: Algernon Huxley, MD;  Location: [facility];  Service: Cardiovascular;  Laterality: N/A;  . ABDOMINAL HYSTERECTOMY  1990 ?  . AV FISTULA PLACEMENT  4/30  . BREAST EXCISIONAL BIOPSY Left 1994   neg surgical bx  . CHOLECYSTECTOMY  2003  . COLONOSCOPY  2014   Dr. Jamal Collin  . COLONOSCOPY WITH PROPOFOL N/A 09/24/2015   Procedure: COLONOSCOPY WITH random colon byopies.;  Surgeon: Lucilla Lame, MD;  Location: [facility];  Service: Endoscopy;  Laterality: N/A;  . ESOPHAGOGASTRODUODENOSCOPY (EGD) WITH PROPOFOL N/A 09/24/2015   Procedure: ESOPHAGOGASTRODUODENOSCOPY (EGD) ;  Surgeon: Lucilla Lame, MD;  Location: [facility];  Service: Endoscopy;  Laterality: N/A;  . EYE SURGERY    . PERIPHERAL VASCULAR CATHETERIZATION N/A 05/29/2015   Procedure: A/V Shuntogram/Fistulagram;  Surgeon: Katha Cabal, MD;  Location: [facility];  Service: Cardiovascular;  Laterality: N/A;  . PERIPHERAL VASCULAR CATHETERIZATION N/A 05/29/2015   Procedure: A/V Shunt Intervention;  Surgeon: Katha Cabal, MD;  Location:  [facility];  Service: Cardiovascular;  Laterality: N/A;  . POLYPECTOMY  09/24/2015   Procedure: POLYPECTOMY INTESTINAL;  Surgeon: Lucilla Lame, MD;  Location: [facility];  Service: Endoscopy;;  cecal polyp ascending polyp   Family History:  Family History  Problem Relation Age of Onset  . Stroke Father   . Hypertension Father   . Breast cancer Other   . Colon cancer Neg Hx   . Liver disease Neg Hx    Family Psychiatric  History: Positive for mood symptoms Social History:  Social History   Substance and Sexual Activity  Alcohol Use No  . Alcohol/week: 0.0 standard drinks     Social History   Substance and Sexual Activity  Drug Use No    Social History   Socioeconomic History  . Marital status: Single    Spouse name: Not on file  . Number of children:  1  . Years of education: Not on file  . Highest education level: Not on file  Occupational History  . Occupation: Surveyor, quantity: [employer]  Social Needs  . Financial resource strain: Not on file  . Food insecurity:    Worry: Not on file    Inability: Not on file  . Transportation needs:    Medical: Not on file    Non-medical: Not on file  Tobacco Use  . Smoking status: Never Smoker  . Smokeless tobacco: Never Used  Substance and Sexual Activity  . Alcohol use: No    Alcohol/week: 0.0 standard drinks  . Drug use: No  . Sexual activity: Not Currently  Lifestyle  . Physical activity:    Days per week: Not on file    Minutes per session: Not on file  . Stress: Not on file  Relationships  . Social connections:    Talks on phone: Not on file    Gets together: Not on file    Attends religious service: Not on file    Active member of club or organization: Not on file    Attends meetings of clubs or organizations: Not on file    Relationship status: Not on file  Other Topics Concern  . 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   Additional Social History:    Allergies:   Allergies  Allergen Reactions  . Morphine And Related Shortness Of Breath    Pt reports chest pain and difficulty breathing.  . Indomethacin Hives  . Pollen Extract Other (See Comments)    Sinus problems and HA    Labs:  Results for orders placed or performed during the hospital encounter of 03/04/18 (from the past 48 hour(s))  Glucose, capillary     Status: Abnormal   Collection Time: 03/07/18  3:52 PM  Result Value Ref Range   Glucose-Capillary 115 (H) 70 - 99 mg/dL  Urinalysis, Complete w Microscopic     Status: Abnormal   Collection Time: 03/07/18  4:41 PM  Result Value Ref Range   Color, Urine AMBER (A) YELLOW    Comment: BIOCHEMICALS MAY BE AFFECTED BY COLOR   APPearance CLEAR (A) CLEAR   Specific  Gravity, Urine 1.011 1.005 - 1.030   pH 5.0 5.0 - 8.0   Glucose, UA NEGATIVE NEGATIVE mg/dL   Hgb urine dipstick MODERATE (A) NEGATIVE   Bilirubin Urine NEGATIVE NEGATIVE   Ketones, ur NEGATIVE NEGATIVE mg/dL   Protein, ur  30 (A) NEGATIVE mg/dL   Nitrite NEGATIVE NEGATIVE   Leukocytes, UA MODERATE (A) NEGATIVE   RBC / HPF >50 (H) 0 - 5 RBC/hpf   WBC, UA >50 (H) 0 - 5 WBC/hpf   Bacteria, UA RARE (A) NONE SEEN   Squamous Epithelial / LPF 0-5 0 - 5   Mucus PRESENT     Comment: Performed at [facility], [address] [place], [place] [address]  Glucose, capillary     Status: None   Collection Time: 03/07/18  7:49 PM  Result Value Ref Range   Glucose-Capillary 89 70 - 99 mg/dL  Glucose, capillary     Status: None   Collection Time: 03/08/18 12:04 AM  Result Value Ref Range   Glucose-Capillary 95 70 - 99 mg/dL  Glucose, capillary     Status: None   Collection Time: 03/08/18  3:38 AM  Result Value Ref Range   Glucose-Capillary 91 70 - 99 mg/dL  Basic metabolic panel     Status: Abnormal   Collection Time: 03/08/18  4:53 AM  Result Value Ref Range   Sodium 146 (H) 135 - 145 mmol/L   Potassium 3.8 3.5 - 5.1 mmol/L   Chloride 106 98 - 111 mmol/L   CO2 26 22 - 32 mmol/L   Glucose, Bld 103 (H) 70 - 99 mg/dL   BUN 70 (H) 8 - 23 mg/dL   Creatinine, Ser 5.42 (H) 0.44 - 1.00 mg/dL   Calcium 8.9 8.9 - 10.3 mg/dL   GFR calc non Af Amer 7 (L) >60 mL/min   GFR calc Af Amer 8 (L) >60 mL/min    Comment: (NOTE) The eGFR has been calculated using the CKD EPI equation. This calculation has not been validated in all clinical situations. eGFR's persistently <60 mL/min signify possible Chronic Kidney Disease.    Anion gap 14 5 - 15    Comment: Performed at [facility], [address] [place], [place] [address]  Magnesium     Status: Abnormal   Collection Time: 03/08/18  4:53 AM  Result Value Ref Range   Magnesium 2.9 (H) 1.7 - 2.4 mg/dL    Comment: Performed at  [facility], [address] [place], [place] [address]  Phosphorus     Status: None   Collection Time: 03/08/18  4:53 AM  Result Value Ref Range   Phosphorus 4.6 2.5 - 4.6 mg/dL    Comment: Performed at [facility], [address] [place], [place] [address]  Troponin I     Status: Abnormal   Collection Time: 03/08/18  4:53 AM  Result Value Ref Range   Troponin I 0.06 (HH) <0.03 ng/mL    Comment: CRITICAL VALUE NOTED. VALUE IS CONSISTENT WITH PREVIOUSLY REPORTED/CALLED VALUE [facility] Performed at [facility], [address] [place], [place] [address]   Glucose, capillary     Status: None   Collection Time: 03/08/18  7:48 AM  Result Value Ref Range   Glucose-Capillary 94 70 - 99 mg/dL  Glucose, capillary     Status: Abnormal   Collection Time: 03/08/18 11:46 AM  Result Value Ref Range   Glucose-Capillary 110 (H) 70 - 99 mg/dL  Glucose, capillary     Status: None   Collection Time: 03/08/18  4:18 PM  Result Value Ref Range   Glucose-Capillary 83 70 - 99 mg/dL  Glucose, capillary     Status: None   Collection Time: 03/08/18  8:20 PM  Result Value Ref Range  Glucose-Capillary 82 70 - 99 mg/dL  Glucose, capillary     Status: None   Collection Time: 03/08/18 11:17 PM  Result Value Ref Range   Glucose-Capillary 99 70 - 99 mg/dL  Glucose, capillary     Status: None   Collection Time: 03/09/18  4:56 AM  Result Value Ref Range   Glucose-Capillary 81 70 - 99 mg/dL  Basic metabolic panel     Status: Abnormal   Collection Time: 03/09/18  5:01 AM  Result Value Ref Range   Sodium 140 135 - 145 mmol/L   Potassium 3.6 3.5 - 5.1 mmol/L   Chloride 101 98 - 111 mmol/L   CO2 31 22 - 32 mmol/L   Glucose, Bld 93 70 - 99 mg/dL   BUN 37 (H) 8 - 23 mg/dL   Creatinine, Ser 3.16 (H) 0.44 - 1.00 mg/dL   Calcium 8.9 8.9 - 10.3 mg/dL   GFR calc non Af Amer 14 (L) >60 mL/min   GFR calc Af Amer 16 (L) >60 mL/min    Comment: (NOTE) The eGFR has been calculated using  the CKD EPI equation. This calculation has not been validated in all clinical situations. eGFR's persistently <60 mL/min signify possible Chronic Kidney Disease.    Anion gap 8 5 - 15    Comment: Performed at [facility], [place], [place] [address]  Glucose, capillary     Status: None   Collection Time: 03/09/18  7:43 AM  Result Value Ref Range   Glucose-Capillary 84 70 - 99 mg/dL  Glucose, capillary     Status: Abnormal   Collection Time: 03/09/18 11:39 AM  Result Value Ref Range   Glucose-Capillary 114 (H) 70 - 99 mg/dL    Current Facility-Administered Medications  Medication Dose Route Frequency Provider Last Rate Last Dose  . acetaminophen (TYLENOL) tablet 650 mg  650 mg Oral Q6H PRN Demetrios Loll, MD       Or  . acetaminophen (TYLENOL) suppository 650 mg  650 mg Rectal Q6H PRN Demetrios Loll, MD      . cefTRIAXone (ROCEPHIN) 2 g in sodium chloride 0.9 % 100 mL IVPB  2 g Intravenous Daily Conforti, John, DO 200 mL/hr at 03/09/18 0904 2 g at 03/09/18 0904  . chlorhexidine gluconate (MEDLINE KIT) (PERIDEX) 0.12 % solution 15 mL  15 mL Mouth Rinse BID Demetrios Loll, MD   15 mL at 03/08/18 2130  . Chlorhexidine Gluconate Cloth 2 % PADS 6 each  6 each Topical Q0600 [name], MD   6 each at 03/08/18 0602  . dextrose 5 % solution   Intravenous Continuous Darel Hong D, NP 30 mL/hr at 03/08/18 2137    . epoetin alfa (EPOGEN,PROCRIT) injection 4,000 Units  4,000 Units Intravenous Q M,W,F-HD [name], MD   4,000 Units at 03/08/18 1238  . famotidine (PEPCID) IVPB 20 mg premix  20 mg Intravenous Q24H Hallaji, Sheema M, RPH 100 mL/hr at 03/09/18 0948 20 mg at 03/09/18 0948  . feeding supplement (NEPRO CARB STEADY) liquid 237 mL  237 mL Oral BID BM Conforti, John, DO      . heparin injection 5,000 Units  5,000 Units Subcutaneous Q8H Demetrios Loll, MD   5,000 Units at 03/09/18 0546  . hydrALAZINE (APRESOLINE) injection 10 mg  10 mg Intravenous Q4H PRN Tukov-Yual,  Magdalene S, NP   10 mg at 03/09/18 0918  . ipratropium-albuterol (DUONEB) 0.5-2.5 (3) MG/3ML nebulizer solution 3 mL  3 mL Nebulization Q4H PRN Dewaine Conger,  Denyce Robert, NP   3 mL at 03/04/18 1727  . MEDLINE mouth rinse  15 mL Mouth Rinse 10 times per day Demetrios Loll, MD   15 mL at 03/09/18 0948  . ondansetron (ZOFRAN) injection 4 mg  4 mg Intravenous Q6H PRN Demetrios Loll, MD      . pentafluoroprop-tetrafluoroeth (GEBAUERS) aerosol   Topical PRN Anthonette Legato, MD        Musculoskeletal: Strength & Muscle Tone: decreased Gait & Station: unsteady Patient leans: N/A  Psychiatric Specialty Exam: Physical Exam  Nursing note and vitals reviewed. Constitutional: She appears well-developed.  HENT:  Head: Normocephalic and atraumatic.  Eyes: Pupils are equal, round, and reactive to light. Conjunctivae are normal.  Neck: Normal range of motion.  Cardiovascular: Normal heart sounds.  Respiratory: No respiratory distress.  Still requiring 2 L of oxygen at least by nasal cannula  GI: Soft.  Musculoskeletal: Normal range of motion.  Neurological: She is alert.  Skin: Skin is warm and dry.  Psychiatric: Her affect is blunt. Her speech is delayed. She is slowed. She expresses impulsivity. She exhibits a depressed mood. She expresses suicidal ideation. She expresses suicidal plans. She exhibits abnormal recent memory.    Review of Systems  Constitutional: Negative.   HENT: Negative.   Eyes: Negative.   Respiratory: Negative.   Cardiovascular: Negative.   Gastrointestinal: Negative.   Musculoskeletal: Negative.   Skin: Negative.   Neurological: Negative.   Psychiatric/Behavioral: Positive for depression, hallucinations, memory loss and suicidal ideas. Negative for substance abuse. The patient is nervous/anxious and has insomnia.     Blood pressure (!) 105/37, pulse (!) 108, temperature 98 F (36.7 C), temperature source Oral, resp. rate 20, height _0  (1.6 m), weight 66.3 kg, SpO2 98 %.Body mass  index is 25.89 kg/m.  General Appearance: Casual  Eye Contact:  Fair  Speech:  Slow  Volume:  Decreased  Mood:  Depressed  Affect:  Congruent  Thought Process:  Goal Directed  Orientation:  Full (Time, Place, and Person)  Thought Content:  Logical  Suicidal Thoughts:  Yes.  with intent/plan  Homicidal Thoughts:  No  Memory:  Immediate;   Fair Recent;   Fair Remote;   Fair  Judgement:  Impaired  Insight:  Fair  Psychomotor Activity:  Decreased  Concentration:  Concentration: Fair  Recall:  [employer] of Knowledge:  Fair  Language:  Fair  Akathisia:  No  Handed:  Right  AIMS (if indicated):     Assets:  Communication Skills Desire for Improvement Resilience Social Support  ADL's:  Impaired  Cognition:  Impaired,  Mild  Sleep:        Treatment Plan Summary: Daily contact with patient to assess and evaluate symptoms and progress in treatment, Medication management and Plan Patient with a history of bipolar disorder with recent episodes of severe depression.  The patient was finally awake alert and oriented enough today to have a lucid conversation and she was quite clear with me that she had been trying to kill herself.  Supportive counseling psychoeducation review of available treatment options.  Not going to change medicine yet today as she is just stabilizing from her time in the intensive care unit.  Continue IV C.  Once she is off of oxygen and is ambulatory we would anticipate likelihood of transfer back to the psychiatric unit.  Patient aware of the plan.  Disposition: Recommend psychiatric Inpatient admission when medically cleared. Supportive therapy provided about ongoing stressors.  John Clapacs,  MD 03/09/2018 1:50 PM

## 2018-03-09 NOTE — Progress Notes (Signed)
Pharmacy Antibiotic Note  Joyce [name] is a 72 y.o. female admitted on 03/04/2018 with a chief complaint of unresponsiveness s/t drug overdose. BCID showed Staph species, BCx grew staph viridans. Pharmacy has been consulted for ceftriaxone dosing. ID following.  Plan: Continue Ceftriaxone 2g IV daily.  Will continue to monitor blood cultures, 1/2 showing streptococcus.   Repeat blood cultures on 8/16 no growth to date.     Height: 5\' 3"  (160 cm) Weight: 146 lb 2.6 oz (66.3 kg) IBW/kg (Calculated) : 52.4  Temp (24hrs), Avg:98.4 F (36.9 C), Min:98 F (36.7 C), Max:99 F (37.2 C)  Recent Labs  Lab 03/02/18 1013 03/04/18 1053 03/05/18 0203 03/06/18 0815 03/08/18 0453 03/09/18 0501  WBC 7.4 6.7 9.7  --   --   --   CREATININE 4.62* 3.85* 4.26* 3.47* 5.42* 3.16*  LATICACIDVEN  --  1.9  --   --   --   --     Estimated Creatinine Clearance: 15 mL/min (A) (by C-G formula based on SCr of 3.16 mg/dL (H)).    Allergies  Allergen Reactions  . Morphine And Related Shortness Of Breath    Pt reports chest pain and difficulty breathing.  . Indomethacin Hives  . Pollen Extract Other (See Comments)    Sinus problems and HA    Antimicrobials this admission: Ceftriaxone 08/16 >>  Microbiology results: 8/15 BCx: gram positive cocci (1/2 - streptococcus on BCID) 8/16 BCx: NG x 4 days  8/15 MRSA PCR: negative  Thank you for allowing pharmacy to be a part of this patient's care.  Pernell Dupre, PharmD, BCPS Clinical Pharmacist 03/09/2018 8:16 AM

## 2018-03-09 NOTE — Evaluation (Signed)
Physical Therapy Evaluation Patient Details Name: Joyce [name] MRN: [id number] DOB: Jun 16, 1946 Today's Date: 03/09/2018   History of Present Illness  Pt is a 72 y.o. female brought to hospital via EMS and she was unresponsive. Pt was dx with acute respiratory failure with hypoxia  that was possibly due to drug overdose. Pt was intubated on 8/15, became responsive on 8/18, and was extubated 8/19. Pt had been d/c from psychiatric care just before being addmitted to hospital. Pt PMH includes CKD, bipolar disorder, diverticulitis, hiatal hernia, HTN, and vertigo.   Clinical Impression  Pt is a confused 72 year old female who was admitted for acute respiratory failure with hypoxia and PMH listed above. Pt cognition was diminished from baseline per sister in room. Pt had repeated uncoordinated and writhing type movments. Pt performs bed mobility with Mod A, transfers sit<>stand with Mod A, see below for mobility deficits. Ambulation deferred 2/2 difficulty in standing. Pt demonstrates deficits with functional mobility 2/2 diminished cognition, weakness, fatigue, and unsteadiness. Pt Would benefit from skilled PT to address above deficits and promote optimal return to PLOF. PT recommends d/c to SNF 2/2 above stated deficits.    Follow Up Recommendations SNF    Equipment Recommendations  None recommended by PT(per SNF)    Recommendations for Other Services       Precautions / Restrictions Precautions Precautions: Fall Restrictions Weight Bearing Restrictions: No      Mobility  Bed Mobility Overal bed mobility: Needs Assistance Bed Mobility: Supine to Sit;Sit to Supine;Rolling Rolling: Supervision   Supine to sit: Mod assist Sit to supine: Mod assist   General bed mobility comments: pt required Mod A to get in and out of bed 2/2 confusion, weakness, poor motor planning. Pt able to roll with supervisoon and havey verbal cueing  Transfers Overall transfer level: Needs  assistance Equipment used: 1 person hand held assist Transfers: Sit to/from Stand Sit to Stand: Max assist         General transfer comment: Pt required Max A sit<>stand 2/2 weakness, confusion, poor motor planning, unstaedieness, and fatigue. Pt sat down with no warning with poor eccentric control. Pt RPE for transfer was hard.  Ambulation/Gait Ambulation/Gait assistance: (deferred)              Stairs            Wheelchair Mobility    Modified Rankin (Stroke Patients Only)       Balance Overall balance assessment: Needs assistance Sitting-balance support: Single extremity supported;Feet supported Sitting balance-Leahy Scale: Poor     Standing balance support: Bilateral upper extremity supported Standing balance-Leahy Scale: Poor                               Pertinent Vitals/Pain      Home Living Family/patient expects to be discharged to:: Private residence Living Arrangements: Other relatives Available Help at Discharge: Family Type of Home: Mobile home Home Access: Stairs to enter Entrance Stairs-Rails: Can reach both Entrance Stairs-Number of Steps: 2 Home Layout: One level Home Equipment: None Additional Comments: pt was a poor historian and sister helped answer some questions    Prior Function Level of Independence: Needs assistance   Gait / Transfers Assistance Needed: right before addmission to hospital pt was independent with mobility and had walked in grocery with sister  ADL's / Homemaking Assistance Needed: Just prior to hospital addmission pt was independent with ADLs but require assistance for  more complex IADLs like shopping, cooking. and house keeping.  6 months prior pt was completly independent and was still driving and working.  Comments: pt was poor historian and sister filled in some information     Hand Dominance   Dominant Hand: Right    Extremity/Trunk Assessment   Upper Extremity Assessment Upper  Extremity Assessment: Generalized weakness;Difficult to assess due to impaired cognition(B UE 3/5 MMT and diminshed coordination)    Lower Extremity Assessment Lower Extremity Assessment: Generalized weakness;Difficult to assess due to impaired cognition(B LE grossly 3/5 MMT)       Communication   Communication: No difficulties  Cognition Arousal/Alertness: Awake/alert Behavior During Therapy: Restless;Impulsive Overall Cognitive Status: Impaired/Different from baseline Area of Impairment: Orientation;Following commands;Safety/judgement;Awareness;Problem solving                 Orientation Level: Disoriented to;Place;Time;Situation     Following Commands: Follows one step commands consistently;Follows multi-step commands inconsistently Safety/Judgement: Decreased awareness of safety   Problem Solving: Requires verbal cues;Requires tactile cues;Difficulty sequencing;Slow processing General Comments: pt was able to answer some simple questions and follow about 50% of command. Pt required multimodal cueing to follow commands.       General Comments      Exercises Other Exercises Other Exercises: pt insructed supine ankle pumps x10, and Min A SLR x10   Assessment/Plan    PT Assessment Patient needs continued PT services  PT Problem List Decreased strength;Decreased range of motion;Decreased activity tolerance;Decreased balance;Decreased mobility;Decreased coordination;Decreased cognition;Decreased knowledge of use of DME;Decreased safety awareness       PT Treatment Interventions DME instruction;Gait training;Stair training;Functional mobility training;Therapeutic activities;Therapeutic exercise;Balance training;Neuromuscular re-education;Cognitive remediation;Patient/family education    PT Goals (Current goals can be found in the Care Plan section)  Acute Rehab PT Goals Patient Stated Goal: go home PT Goal Formulation: With patient/family Time For Goal Achievement:  03/23/18 Potential to Achieve Goals: Fair    Frequency Min 2X/week   Barriers to discharge   unclear assistance available at home    Co-evaluation               AM-PAC PT "6 Clicks" Daily Activity  Outcome Measure Difficulty turning over in bed (including adjusting bedclothes, sheets and blankets)?: Unable Difficulty moving from lying on back to sitting on the side of the bed? : Unable Difficulty sitting down on and standing up from a chair with arms (e.g., wheelchair, bedside commode, etc,.)?: Unable Help needed moving to and from a bed to chair (including a wheelchair)?: Total Help needed walking in hospital room?: Total Help needed climbing 3-5 steps with a railing? : Total 6 Click Score: 6    End of Session Equipment Utilized During Treatment: Gait belt Activity Tolerance: Patient limited by fatigue;Other (comment)(confusion) Patient left: in bed;with call bell/phone within reach;with nursing/sitter in room;with family/visitor present Nurse Communication: Mobility status PT Visit Diagnosis: Unsteadiness on feet (R26.81);Other abnormalities of gait and mobility (R26.89);Muscle weakness (generalized) (M62.81);Difficulty in walking, not elsewhere classified (R26.2)    Time: 3810-1751 PT Time Calculation (min) (ACUTE ONLY): 30 min   Charges:              Rosario Adie, SPT   Rosario Adie 03/09/2018, 5:20 PM

## 2018-03-10 DIAGNOSIS — Z992 Dependence on renal dialysis: Secondary | ICD-10-CM

## 2018-03-10 DIAGNOSIS — T50902D Poisoning by unspecified drugs, medicaments and biological substances, intentional self-harm, subsequent encounter: Secondary | ICD-10-CM

## 2018-03-10 DIAGNOSIS — I120 Hypertensive chronic kidney disease with stage 5 chronic kidney disease or end stage renal disease: Secondary | ICD-10-CM

## 2018-03-10 DIAGNOSIS — Z79899 Other long term (current) drug therapy: Secondary | ICD-10-CM

## 2018-03-10 DIAGNOSIS — T1491XD Suicide attempt, subsequent encounter: Secondary | ICD-10-CM

## 2018-03-10 DIAGNOSIS — F319 Bipolar disorder, unspecified: Secondary | ICD-10-CM

## 2018-03-10 DIAGNOSIS — Q613 Polycystic kidney, unspecified: Secondary | ICD-10-CM

## 2018-03-10 DIAGNOSIS — R7881 Bacteremia: Secondary | ICD-10-CM

## 2018-03-10 DIAGNOSIS — N186 End stage renal disease: Secondary | ICD-10-CM

## 2018-03-10 DIAGNOSIS — B954 Other streptococcus as the cause of diseases classified elsewhere: Secondary | ICD-10-CM

## 2018-03-10 LAB — CULTURE, BLOOD (ROUTINE X 2)
CULTURE: NO GROWTH
Culture: NO GROWTH

## 2018-03-10 LAB — GLUCOSE, CAPILLARY
Glucose-Capillary: 107 mg/dL — ABNORMAL HIGH (ref 70–99)
Glucose-Capillary: 107 mg/dL — ABNORMAL HIGH (ref 70–99)

## 2018-03-10 LAB — CBC
HEMATOCRIT: 28 % — AB (ref 35.0–47.0)
HEMOGLOBIN: 9.6 g/dL — AB (ref 12.0–16.0)
MCH: 33.7 pg (ref 26.0–34.0)
MCHC: 34.4 g/dL (ref 32.0–36.0)
MCV: 97.7 fL (ref 80.0–100.0)
Platelets: 236 10*3/uL (ref 150–440)
RBC: 2.86 MIL/uL — ABNORMAL LOW (ref 3.80–5.20)
RDW: 13.7 % (ref 11.5–14.5)
WBC: 9.2 10*3/uL (ref 3.6–11.0)

## 2018-03-10 LAB — PHOSPHORUS: PHOSPHORUS: 3.1 mg/dL (ref 2.5–4.6)

## 2018-03-10 MED ORDER — CARBAMAZEPINE 200 MG PO TABS
200.0000 mg | ORAL_TABLET | Freq: Three times a day (TID) | ORAL | Status: DC
  Administered 2018-03-10 (×2): 200 mg via ORAL
  Filled 2018-03-10 (×4): qty 1

## 2018-03-10 MED ORDER — HALOPERIDOL 2 MG PO TABS
2.5000 mg | ORAL_TABLET | ORAL | Status: DC
  Administered 2018-03-10: 13:00:00 2.5 mg via ORAL
  Filled 2018-03-10: qty 1

## 2018-03-10 MED ORDER — METOPROLOL TARTRATE 25 MG PO TABS
25.0000 mg | ORAL_TABLET | Freq: Two times a day (BID) | ORAL | Status: DC
  Administered 2018-03-10: 11:00:00 25 mg via ORAL
  Filled 2018-03-10: qty 1

## 2018-03-10 MED ORDER — TIMOLOL MALEATE 0.5 % OP SOLN
1.0000 [drp] | Freq: Two times a day (BID) | OPHTHALMIC | Status: DC
  Filled 2018-03-10: qty 5

## 2018-03-10 MED ORDER — FAMOTIDINE 20 MG PO TABS
20.0000 mg | ORAL_TABLET | Freq: Every day | ORAL | Status: DC
  Administered 2018-03-10: 20 mg via ORAL
  Filled 2018-03-10: qty 1

## 2018-03-10 MED ORDER — ROPINIROLE HCL 1 MG PO TABS
2.0000 mg | ORAL_TABLET | Freq: Every day | ORAL | Status: DC
  Administered 2018-03-10: 11:00:00 2 mg via ORAL
  Filled 2018-03-10: qty 2

## 2018-03-10 MED ORDER — BRIMONIDINE TARTRATE 0.2 % OP SOLN
1.0000 [drp] | Freq: Two times a day (BID) | OPHTHALMIC | Status: DC
  Filled 2018-03-10: qty 5

## 2018-03-10 MED ORDER — DICYCLOMINE HCL 20 MG PO TABS
20.0000 mg | ORAL_TABLET | Freq: Three times a day (TID) | ORAL | Status: DC
  Administered 2018-03-10 (×2): 20 mg via ORAL
  Filled 2018-03-10 (×4): qty 1

## 2018-03-10 NOTE — Progress Notes (Signed)
Pre HD assessment    03/10/18 2132  Neurological  Level of Consciousness Alert  Orientation Level Oriented to person  Respiratory  Respiratory Pattern Regular;Unlabored  Chest Assessment Chest expansion symmetrical  Cardiac  ECG Monitor Yes  Vascular  R Radial Pulse +2  L Radial Pulse +2  Edema Generalized  Integumentary  Integumentary (WDL) X  Skin Color Appropriate for ethnicity  Musculoskeletal  Musculoskeletal (WDL) X  Generalized Weakness Yes  Assistive Device None  GU Assessment  Genitourinary (WDL) X  Genitourinary Symptoms  (HD)  Psychosocial  Psychosocial (WDL) WDL

## 2018-03-10 NOTE — Clinical Social Work Note (Signed)
CSW consulted for SNF. Per chart review patient will be going to inpatient psych unit and therefore will not be appropriate for SNF at this time. CSW signing off. Please re consult if further needs arise.   [place], [place]

## 2018-03-10 NOTE — Discharge Summary (Signed)
[facility] NAME: Joyce [name]    MR#:  [id number]  DATE OF BIRTH:  September 13, 1945  DATE OF ADMISSION:  03/04/2018   ADMITTING PHYSICIAN: Demetrios Loll, MD  DATE OF DISCHARGE: 03/10/2018  PRIMARY CARE PHYSICIAN: Cletis Athens, MD   ADMISSION DIAGNOSIS:  respiratory distress DISCHARGE DIAGNOSIS:  Principal Problem:   Bipolar 1 disorder, depressed ([place]) Active Problems:   Chronic kidney disease   Acute respiratory failure with hypoxia ([place])   Suicide attempt ([place])  SECONDARY DIAGNOSIS:   Past Medical History:  Diagnosis Date  . Anal fissure   . Bipolar affective disorder ([place])   . CKD (chronic kidney disease)    Dr Holley Raring [name] 4  . Colon polyps   . Diverticulitis   . Diverticulitis   . Family history of adverse reaction to anesthesia    mom - PONV  . GERD (gastroesophageal reflux disease)   . Headache    migraines - none over 10 yrs  . Heart murmur   . History of hiatal hernia   . Hypertension   . Pancreatitis    Valproic acid  . Vertigo    HOSPITAL COURSE:  72 year old female with past medical history significant for bipolar disorder who was just discharged from [facility] unit on 03/03/2018, history of end-stage renal disease on hemodialysis, hypertension, polycystic kidney disease presents to hospital secondary to an unresponsive episode  *Acute toxic metabolic encephalopathy Resolved Secondary to acute drug overdose/attempted suicide  EEGnoted forgeneralized slowing, neurology input appreciated, MRI of the brain was a negative study, CT head was a negative study, nursing care PRN, aspiration/fall precautions while in house  *Acute probable drug overdose Dr. Claypacs/psychiatry input appreciated -for inpatient transfer to psychiatry once medically cleared  *End-stage renal disease on dialysis Nephrology input appreciated-for hemodialysis, typically receivesMWF Has a left AV  fistula  *Acute strep viridans bacteremia Echocardiogram negative for endocarditis Continue empiric Rocephin, follow-up on outstanding cultures Per Dr. Delaine Lame, strep viridans in blood could be a contaminant VS true pathogen. With just one culture being positive and with no h/o fever or other prodromal features makes infection less likely. Repeated blood cultures are negative for 5 days.  *ChronicBipolar disorder Plan of care per psychiatry  *Anemia of chronic disease continue EPO with dialysis. DISCHARGE CONDITIONS:  Medically stable, discharge to psych unit today. CONSULTS OBTAINED:  Treatment Team:  Wilhelmina Mcardle, MD [name], MD Alexis Goodell, MD Tsosie Billing, MD Clapacs, [name], MD DRUG ALLERGIES:   Allergies  Allergen Reactions  . Morphine And Related Shortness Of Breath    Pt reports chest pain and difficulty breathing.  . Indomethacin Hives  . Pollen Extract Other (See Comments)    Sinus problems and HA   DISCHARGE MEDICATIONS:   Allergies as of 03/10/2018      Reactions   Morphine And Related Shortness Of Breath   Pt reports chest pain and difficulty breathing.   Indomethacin Hives   Pollen Extract Other (See Comments)   Sinus problems and HA      Medication List    TAKE these medications   acetaminophen 325 MG tablet Commonly known as:  TYLENOL Take 325 mg 2 (two) times daily as needed by mouth for moderate pain or headache.   brimonidine 0.2 % ophthalmic solution Commonly known as:  ALPHAGAN Place 1 drop into both eyes 2 (two) times daily.   carbamazepine 200 MG tablet Commonly known as:  TEGRETOL Take 1 tablet (  200 mg total) by mouth 3 (three) times daily.   cholecalciferol 1000 units tablet Commonly known as:  VITAMIN D Take 1,000 Units by mouth daily.   cyanocobalamin 1000 MCG/ML injection Commonly known as:  (VITAMIN B-12) Inject 1,000 mcg into the muscle every 30 (thirty) days.   dicyclomine 20 MG  tablet Commonly known as:  BENTYL Take 1 tablet (20 mg total) by mouth 3 (three) times daily before meals.   famotidine 20 MG tablet Commonly known as:  PEPCID TAKE 1 TABLET BY MOUTH EVERY DAY   haloperidol 5 MG tablet Commonly known as:  HALDOL Take 0.5 tablets (2.5 mg total) by mouth every Monday, Wednesday, and Friday with hemodialysis AND 1 tablet (5 mg total) at bedtime.   lidocaine 5 % Commonly known as:  LIDODERM Place 1 patch onto the skin daily. Remove & Discard patch within 12 hours or as directed by MD   lidocaine-prilocaine cream Commonly known as:  EMLA Apply 1 application every Monday, Wednesday, and Friday topically. At dialysis   metoprolol tartrate 25 MG tablet Commonly known as:  LOPRESSOR Take 1 tablet (25 mg total) by mouth 2 (two) times daily.   multivitamin Tabs tablet TAKE 1 TABLET BY MOUTH EVERYDAY AT BEDTIME   rOPINIRole 2 MG tablet Commonly known as:  REQUIP Take 1 tablet (2 mg total) by mouth daily.   temazepam 15 MG capsule Commonly known as:  RESTORIL Take 1 capsule (15 mg total) by mouth at bedtime as needed for sleep.   timolol 0.5 % ophthalmic solution Commonly known as:  TIMOPTIC Place 1 drop into both eyes 2 (two) times daily.        DISCHARGE INSTRUCTIONS:  See AVS.  If you experience worsening of your admission symptoms, develop shortness of breath, life threatening emergency, suicidal or homicidal thoughts you must seek medical attention immediately by calling 911 or calling your MD immediately  if symptoms less severe.  You Must read complete instructions/literature along with all the possible adverse reactions/side effects for all the Medicines you take and that have been prescribed to you. Take any new Medicines after you have completely understood and accpet all the possible adverse reactions/side effects.   Please note  You were cared for by a hospitalist during your hospital stay. If you have any questions about your  discharge medications or the care you received while you were in the hospital after you are discharged, you can call the unit and asked to speak with the hospitalist on call if the hospitalist that took care of you is not available. Once you are discharged, your primary care physician will handle any further medical issues. Please note that NO REFILLS for any discharge medications will be authorized once you are discharged, as it is imperative that you return to your primary care physician (or establish a relationship with a primary care physician if you do not have one) for your aftercare needs so that they can reassess your need for medications and monitor your lab values.    On the day of Discharge:  VITAL SIGNS:  Blood pressure (!) 160/63, pulse 92, temperature 98.4 F (36.9 C), temperature source Oral, resp. rate 18, height 5\' 3"  (1.6 m), weight 66.7 kg, SpO2 96 %. PHYSICAL EXAMINATION:  GENERAL:  72 y.o.-year-old patient lying in the bed with no acute distress.  EYES: Pupils equal, round, reactive to light and accommodation. No scleral icterus. Extraocular muscles intact.  HEENT: Head atraumatic, normocephalic. Oropharynx and nasopharynx clear.  NECK:  Supple, no jugular venous distention. No thyroid enlargement, no tenderness.  LUNGS: Normal breath sounds bilaterally, no wheezing, rales,rhonchi or crepitation. No use of accessory muscles of respiration.  CARDIOVASCULAR: S1, S2 normal. No murmurs, rubs, or gallops.  ABDOMEN: Soft, non-tender, non-distended. Bowel sounds present. No organomegaly or mass.  EXTREMITIES: No pedal edema, cyanosis, or clubbing.  NEUROLOGIC: Cranial nerves II through XII are intact. Muscle strength 5/5 in all extremities. Sensation intact. Gait not checked.  PSYCHIATRIC: The patient is alert and oriented x 3.  SKIN: No obvious rash, lesion, or ulcer.  DATA REVIEW:   CBC Recent Labs  Lab 03/10/18 0340  WBC 9.2  HGB 9.6*  HCT 28.0*  PLT 236     Chemistries  Recent Labs  Lab 03/04/18 1053  03/08/18 0453 03/09/18 0501  NA 135   < > 146* 140  K 4.5   < > 3.8 3.6  CL 100   < > 106 101  CO2 27   < > 26 31  GLUCOSE 127*   < > 103* 93  BUN 18   < > 70* 37*  CREATININE 3.85*   < > 5.42* 3.16*  CALCIUM 8.4*   < > 8.9 8.9  MG  --    < > 2.9*  --   AST 21  --   --   --   ALT 15  --   --   --   ALKPHOS 66  --   --   --   BILITOT 0.7  --   --   --    < > = values in this interval not displayed.     Microbiology Results  Results for orders placed or performed during the hospital encounter of 03/04/18  Urine culture     Status: None   Collection Time: 03/04/18 11:03 AM  Result Value Ref Range Status   Specimen Description   Final    URINE, RANDOM Performed at [facility], [address], [place], [place] [address]    Special Requests   Final    NONE Performed at [facility], [address] [place], [place] [address]    Culture   Final    NO GROWTH Performed at [facility], [place] [address], [place], [place] [address]    Report Status 03/06/2018 FINAL  Final  Blood Cultures (routine x 2)     Status: Abnormal   Collection Time: 03/04/18 11:18 AM  Result Value Ref Range Status   Specimen Description   Final    BLOOD RIGHT ARM Performed at [facility], [place] [address], [place], [place] [address]    Special Requests   Final    BOTTLES DRAWN AEROBIC AND ANAEROBIC Blood Culture adequate volume Performed at [facility], [address] [place], [place] [address]    Culture  Setup Time   Final    GRAM POSITIVE COCCI AEROBIC BOTTLE ONLY CRITICAL RESULT CALLED TO, READ BACK BY AND VERIFIED WITH: DAVID BESANTI ON 03/05/18 AT 0109 JAG    Culture (A)  Final    VIRIDANS STREPTOCOCCUS THE SIGNIFICANCE OF ISOLATING THIS ORGANISM FROM A SINGLE SET OF BLOOD CULTURES WHEN MULTIPLE SETS ARE DRAWN IS UNCERTAIN. PLEASE NOTIFY THE MICROBIOLOGY DEPARTMENT WITHIN ONE WEEK IF SPECIATION  AND SENSITIVITIES ARE REQUIRED. Performed at [facility], [place] [address] [place], [place] [address]    Report Status 03/07/2018 FINAL  Final  Blood Culture ID Panel (Reflexed)  Status: Abnormal   Collection Time: 03/04/18 11:18 AM  Result Value Ref Range Status   Enterococcus species NOT DETECTED NOT DETECTED Final   Listeria monocytogenes NOT DETECTED NOT DETECTED Final   Staphylococcus species NOT DETECTED NOT DETECTED Final   Staphylococcus aureus NOT DETECTED NOT DETECTED Final   Streptococcus species DETECTED (A) NOT DETECTED Final    Comment: Not Enterococcus species, Streptococcus agalactiae, Streptococcus pyogenes, or Streptococcus pneumoniae. CRITICAL RESULT CALLED TO, READ BACK BY AND VERIFIED WITH: DAVID BESANTI ON 03/05/18 AT 0109 BY JAG    Streptococcus agalactiae NOT DETECTED NOT DETECTED Final   Streptococcus pneumoniae NOT DETECTED NOT DETECTED Final   Streptococcus pyogenes NOT DETECTED NOT DETECTED Final   Acinetobacter baumannii NOT DETECTED NOT DETECTED Final   Enterobacteriaceae species NOT DETECTED NOT DETECTED Final   Enterobacter cloacae complex NOT DETECTED NOT DETECTED Final   Escherichia coli NOT DETECTED NOT DETECTED Final   Klebsiella oxytoca NOT DETECTED NOT DETECTED Final   Klebsiella pneumoniae NOT DETECTED NOT DETECTED Final   Proteus species NOT DETECTED NOT DETECTED Final   Serratia marcescens NOT DETECTED NOT DETECTED Final   Haemophilus influenzae NOT DETECTED NOT DETECTED Final   Neisseria meningitidis NOT DETECTED NOT DETECTED Final   Pseudomonas aeruginosa NOT DETECTED NOT DETECTED Final   Candida albicans NOT DETECTED NOT DETECTED Final   Candida glabrata NOT DETECTED NOT DETECTED Final   Candida krusei NOT DETECTED NOT DETECTED Final   Candida parapsilosis NOT DETECTED NOT DETECTED Final   Candida tropicalis NOT DETECTED NOT DETECTED Final    Comment: Performed at [facility], [address] [place], [place] [address]   MRSA PCR Screening     Status: None   Collection Time: 03/04/18 12:37 PM  Result Value Ref Range Status   MRSA by PCR NEGATIVE NEGATIVE Final    Comment:        The GeneXpert MRSA Assay (FDA approved for NASAL specimens only), is one component of a comprehensive MRSA colonization surveillance program. It is not intended to diagnose MRSA infection nor to guide or monitor treatment for MRSA infections. Performed at [facility], [address] [place], [place] [address]   Culture, blood (Routine X 2) w Reflex to ID Panel     Status: None   Collection Time: 03/05/18  2:02 AM  Result Value Ref Range Status   Specimen Description BLOOD RIGHT HAND  Final   Special Requests   Final    BOTTLES DRAWN AEROBIC ONLY Blood Culture results may not be optimal due to an inadequate volume of blood received in culture bottles   Culture   Final    NO GROWTH 5 DAYS Performed at [facility], [address] [place], [place] [address]    Report Status 03/10/2018 FINAL  Final  Culture, blood (Routine X 2) w Reflex to ID Panel     Status: None   Collection Time: 03/05/18  2:03 AM  Result Value Ref Range Status   Specimen Description BLOOD RIGHT HAND  Final   Special Requests   Final    BOTTLES DRAWN AEROBIC ONLY Blood Culture results may not be optimal due to an inadequate volume of blood received in culture bottles   Culture   Final    NO GROWTH 5 DAYS Performed at [facility], [address], [place], [place] [address]    Report Status 03/10/2018 FINAL  Final    RADIOLOGY:  No results found.   Management plans discussed with  the patient her daughter and they are in agreement.  CODE STATUS: Full Code   TOTAL TIME TAKING CARE OF THIS PATIENT: 37 minutes.    Demetrios Loll M.D on 03/10/2018 at 3:35 PM  Between 7am to 6pm - Pager - [phone]  After 6pm go to [email or web address] - Proofreader  [facility]  Office   [phone]  CC: Primary care physician; Cletis Athens, MD   Note: This dictation was prepared with Dragon dictation along with smaller phrase technology. Any transcriptional errors that result from this process are unintentional.

## 2018-03-10 NOTE — Progress Notes (Signed)
   03/10/18 1305  Clinical Encounter Type  Visited With Patient and family together  Visit Type Initial;Spiritual support  Referral From Nurse  Consult/Referral To Chaplain  Spiritual Encounters  Spiritual Needs Other (Comment)   Ch received an OR to update or create an AD. The patient is one day removed from altered mental status, while she is showing improvements, in my judgement, another day would be needed to complete an AD. I will follow up tomorrow morning.

## 2018-03-10 NOTE — Progress Notes (Signed)
HD tx start    03/10/18 2145  Vital Signs  Pulse Rate 95  Pulse Rate Source Monitor  Resp (!) 22  BP 138/64  BP Location Right Arm  BP Method Automatic  Patient Position (if appropriate) Lying  Oxygen Therapy  SpO2 97 %  O2 Device Room Air  During Hemodialysis Assessment  Blood Flow Rate (mL/min) 400 mL/min  Arterial Pressure (mmHg) -220 mmHg  Venous Pressure (mmHg) 180 mmHg  Transmembrane Pressure (mmHg) 60 mmHg  Ultrafiltration Rate (mL/min) 570 mL/min  Dialysate Flow Rate (mL/min) 800 ml/min  Conductivity: Machine  13.8  HD Safety Checks Performed Yes  Dialysis Fluid Bolus Normal Saline  Bolus Amount (mL) 250 mL  Intra-Hemodialysis Comments Tx initiated  Fistula / Graft Left Upper arm Arteriovenous fistula  Placement Date: 08/13/15   Placed prior to admission: Yes  Orientation: Left  Access Location: Upper arm  Access Type: Arteriovenous fistula  Status Accessed  Needle Size 15

## 2018-03-10 NOTE — Progress Notes (Signed)
[facility]  ROUNDING NOTE   Subjective:  Patient awake, alert, and following commands this a.m. She is due for hemodialysis today. Her family is at the bedside.   Objective:  Vital signs in last 24 hours:  Temp:  [98 F (36.7 C)-98.4 F (36.9 C)] 98.4 F (36.9 C) (08/20 1934) Pulse Rate:  [92-108] 92 (08/21 0418) Resp:  [14-20] 18 (08/21 0418) BP: (105-160)/(37-64) 160/63 (08/21 0418) SpO2:  [96 %-99 %] 96 % (08/21 0418) Weight:  [65.7 kg-66.7 kg] 66.7 kg (08/21 0418)  Weight change: -0.7 kg Filed Weights   03/09/18 0400 03/10/18 0100 03/10/18 0418  Weight: 66.3 kg 65.7 kg 66.7 kg    Intake/Output: I/O last 3 completed shifts: In: 324.7 [P.O.:120; I.V.:204.7] Out: 0    Intake/Output this shift:  No intake/output data recorded.  Physical Exam: General: No acute distress  Head: [place]/AT hearing intact  Eyes: Anicteric  Neck: Supple  Lungs:  Scattered rhonchi, normal effort  Heart: S1S2 no rubs  Abdomen:  Soft, nontender, BS present  Extremities: Trace peripheral edema.  Neurologic: Awake, alert, follows commands  Skin: No lesions  Access: Left AVF    Basic Metabolic Panel: Recent Labs  Lab 03/04/18 1053 03/04/18 1341 03/05/18 0203 03/06/18 0815 03/08/18 0453 03/09/18 0501 03/10/18 0340  NA 135  --  136 141 146* 140  --   K 4.5  --  4.8 3.9 3.8 3.6  --   CL 100  --  103 104 106 101  --   CO2 27  --  _0 --   GLUCOSE 127*  --  122* 141* 103* 93  --   BUN 18  --  22 24* 70* 37*  --   CREATININE 3.85*  --  4.26* 3.47* 5.42* 3.16*  --   CALCIUM 8.4*  --  8.7* 9.1 8.9 8.9  --   MG  --  2.0 1.9  --  2.9*  --   --   PHOS  --  3.5 5.2*  --  4.6  --  3.1    Liver Function Tests: Recent Labs  Lab 03/04/18 1053  AST 21  ALT 15  ALKPHOS 66  BILITOT 0.7  PROT 5.7*  ALBUMIN 3.4*   No results for input(s): LIPASE, AMYLASE in the last 168 hours. No results for input(s): AMMONIA in the last 168 hours.  CBC: Recent Labs  Lab  03/04/18 1053 03/05/18 0203 03/10/18 0340  WBC 6.7 9.7 9.2  HGB 9.8* 10.7* 9.6*  HCT 27.2* 31.2* 28.0*  MCV 95.6 98.3 97.7  PLT 142* 158 236    Cardiac Enzymes: Recent Labs  Lab 03/05/18 1817 03/08/18 0453  TROPONINI 0.05* 0.06*    BNP: Invalid input(s): POCBNP  CBG: Recent Labs  Lab 03/09/18 1633 03/09/18 2018 03/09/18 2356 03/10/18 0414 03/10/18 0752  GLUCAP 99 119* 144* 107* 107*    Microbiology: Results for orders placed or performed during the hospital encounter of 03/04/18  Urine culture     Status: None   Collection Time: 03/04/18 11:03 AM  Result Value Ref Range Status   Specimen Description   Final    URINE, RANDOM Performed at [facility], [address], [place], [place] [address]    Special Requests   Final    NONE Performed at [facility], [address] [place], [place] [address]    Culture   Final    NO GROWTH Performed at [facility], [address] [place], [place] [address]    Report Status 03/06/2018 FINAL  Final  Blood Cultures (routine x 2)     Status: Abnormal   Collection Time: 03/04/18 11:18 AM  Result Value Ref Range Status   Specimen Description   Final    BLOOD RIGHT ARM Performed at [facility], [place] [address], [place], [place] [address]    Special Requests   Final    BOTTLES DRAWN AEROBIC AND ANAEROBIC Blood Culture adequate volume Performed at [facility], [address] [place], [place] [address]    Culture  Setup Time   Final    GRAM POSITIVE COCCI AEROBIC BOTTLE ONLY CRITICAL RESULT CALLED TO, READ BACK BY AND VERIFIED WITH: DAVID BESANTI ON 03/05/18 AT 0109 JAG    Culture (A)  Final    VIRIDANS STREPTOCOCCUS THE SIGNIFICANCE OF ISOLATING THIS ORGANISM FROM A SINGLE SET OF BLOOD CULTURES WHEN MULTIPLE SETS ARE DRAWN IS UNCERTAIN. PLEASE NOTIFY THE MICROBIOLOGY DEPARTMENT WITHIN ONE WEEK IF SPECIATION AND SENSITIVITIES ARE REQUIRED. Performed at [facility], [place] [address], [place], [place] [address]    Report Status 03/07/2018 FINAL  Final  Blood Culture ID Panel (Reflexed)     Status: Abnormal   Collection Time: 03/04/18 11:18 AM  Result Value Ref Range Status   Enterococcus species NOT DETECTED NOT DETECTED Final   Listeria monocytogenes NOT DETECTED NOT DETECTED Final   Staphylococcus species NOT DETECTED NOT DETECTED Final   Staphylococcus aureus NOT DETECTED NOT DETECTED Final   Streptococcus species DETECTED (A) NOT DETECTED Final    Comment: Not Enterococcus species, Streptococcus agalactiae, Streptococcus pyogenes, or Streptococcus pneumoniae. CRITICAL RESULT CALLED TO, READ BACK BY AND VERIFIED WITH: DAVID BESANTI ON 03/05/18 AT 0109 BY JAG    Streptococcus agalactiae NOT DETECTED NOT DETECTED Final   Streptococcus pneumoniae NOT DETECTED NOT DETECTED Final   Streptococcus pyogenes NOT DETECTED NOT DETECTED Final   Acinetobacter baumannii NOT DETECTED NOT DETECTED Final   Enterobacteriaceae species NOT DETECTED NOT DETECTED Final   Enterobacter cloacae complex NOT DETECTED NOT DETECTED Final   Escherichia coli NOT DETECTED NOT DETECTED Final   Klebsiella oxytoca NOT DETECTED NOT DETECTED Final   Klebsiella pneumoniae NOT DETECTED NOT DETECTED Final   Proteus species NOT DETECTED NOT DETECTED Final   Serratia marcescens NOT DETECTED NOT DETECTED Final   Haemophilus influenzae NOT DETECTED NOT DETECTED Final   Neisseria meningitidis NOT DETECTED NOT DETECTED Final   Pseudomonas aeruginosa NOT DETECTED NOT DETECTED Final   Candida albicans NOT DETECTED NOT DETECTED Final   Candida glabrata NOT DETECTED NOT DETECTED Final   Candida krusei NOT DETECTED NOT DETECTED Final   Candida parapsilosis NOT DETECTED NOT DETECTED Final   Candida tropicalis NOT DETECTED NOT DETECTED Final    Comment: Performed at [facility], [address] [place], [place] [address]  MRSA PCR Screening     Status: None   Collection Time:  03/04/18 12:37 PM  Result Value Ref Range Status   MRSA by PCR NEGATIVE NEGATIVE Final    Comment:        The GeneXpert MRSA Assay (FDA approved for NASAL specimens only), is one component of a comprehensive MRSA colonization surveillance program. It is not intended to diagnose MRSA infection nor to guide or monitor treatment for MRSA infections. Performed at [facility], [address] [place], [place] [address]   Culture, blood (Routine X 2) w Reflex to ID Panel  Status: None   Collection Time: 03/05/18  2:02 AM  Result Value Ref Range Status   Specimen Description BLOOD RIGHT HAND  Final   Special Requests   Final    BOTTLES DRAWN AEROBIC ONLY Blood Culture results may not be optimal due to an inadequate volume of blood received in culture bottles   Culture   Final    NO GROWTH 5 DAYS Performed at [facility], [address] [place], [place] [address]    Report Status 03/10/2018 FINAL  Final  Culture, blood (Routine X 2) w Reflex to ID Panel     Status: None   Collection Time: 03/05/18  2:03 AM  Result Value Ref Range Status   Specimen Description BLOOD RIGHT HAND  Final   Special Requests   Final    BOTTLES DRAWN AEROBIC ONLY Blood Culture results may not be optimal due to an inadequate volume of blood received in culture bottles   Culture   Final    NO GROWTH 5 DAYS Performed at [facility], [address] [place], [place] [address]    Report Status 03/10/2018 FINAL  Final    Coagulation Studies: No results for input(s): LABPROT, INR in the last 72 hours.  Urinalysis: Recent Labs    03/07/18 1641  COLORURINE AMBER*  LABSPEC 1.011  PHURINE 5.0  GLUCOSEU NEGATIVE  HGBUR MODERATE*  BILIRUBINUR NEGATIVE  KETONESUR NEGATIVE  PROTEINUR 30*  NITRITE NEGATIVE  LEUKOCYTESUR MODERATE*      Imaging: No results found.   Medications:   . cefTRIAXone (ROCEPHIN)  IV 2 g (03/10/18 1053)  . famotidine (PEPCID) IV Stopped  (03/09/18 1827)   . brimonidine  1 drop Both Eyes BID  . brimonidine  1 drop Both Eyes BID  . carbamazepine  200 mg Oral TID  . chlorhexidine gluconate (MEDLINE KIT)  15 mL Mouth Rinse BID  . dicyclomine  20 mg Oral TID AC  . epoetin (EPOGEN/PROCRIT) injection  4,000 Units Intravenous Q M,W,F-HD  . famotidine  20 mg Oral Daily  . feeding supplement (NEPRO CARB STEADY)  237 mL Oral BID BM  . heparin  5,000 Units Subcutaneous Q8H  . mouth rinse  15 mL Mouth Rinse 10 times per day  . metoprolol tartrate  25 mg Oral BID  . rOPINIRole  2 mg Oral Daily  . timolol  1 drop Both Eyes BID  . timolol  1 drop Both Eyes BID   acetaminophen **OR** acetaminophen, hydrALAZINE, ipratropium-albuterol, [DISCONTINUED] ondansetron **OR** ondansetron (ZOFRAN) IV, pentafluoroprop-tetrafluoroeth  Assessment/ Plan:  [name] is a 72 y.o. white female with end stage renal disease on hemodialysis, hypertension, bipolar disorder, GERD, polycystic kidney disease  [facility] MWF left AVF  1. End Stage Renal Disease  -Patient due for hemodialysis today.  Orders have been prepared.  Ultrafiltration target 1.5 kg.  2.  Acute Resp failure -Secondary to medication overdose.  Patient doing well post extubation.  3. Anemia of chronic kidney disease: Hemoglobin 9.6 at last check.  Maintain the patient on Epogen 4000 units IV with dialysis.  4.  Altered mental status Her mental status is greatly improved.  She is awake, alert and conversive today.       LOS: 6 Mckynzie Liwanag 8/21/201911:14 AM

## 2018-03-10 NOTE — Progress Notes (Signed)
Pre HD assessment    03/10/18 2131  Vital Signs  Temp 98.6 F (37 C)  Temp Source Oral  Pulse Rate 89  Pulse Rate Source Monitor  Resp 15  BP (!) 152/75  BP Location Right Arm  BP Method Automatic  Patient Position (if appropriate) Lying  Oxygen Therapy  SpO2 98 %  O2 Device Room Air  Pain Assessment  Pain Scale 0-10  Pain Score 0  Dialysis Weight  Weight 67.9 kg  Type of Weight Pre-Dialysis  Time-Out for Hemodialysis  What Procedure? HD  Pt Identifiers(min of two) First/Last Name;MRN/Account#  Correct Site? Yes  Correct Side? Yes  Correct Procedure? Yes  Consents Verified? Yes  Rad Studies Available? N/A  Safety Precautions Reviewed? Yes  Engineer, civil (consulting) Number  (4A)  Station Number 4  UF/Alarm Test Passed  Conductivity: Meter 13.8  Conductivity: Machine  13.9  pH 7.4  Reverse Osmosis main  Normal Saline Lot Number [id number]  Dialyzer Lot Number [id number]  Disposable Set Lot Number [id number]  Machine Temperature 98.6 F (37 C)  Musician and Audible Yes  Blood Lines Intact and Secured Yes  Pre Treatment Patient Checks  Vascular access used during treatment Fistula  Hepatitis B Surface Antigen Results Negative  Date Hepatitis B Surface Antigen Drawn 01/06/18  Hepatitis B Surface Antibody  (>10)  Date Hepatitis B Surface Antibody Drawn 02/24/18  Hemodialysis Consent Verified Yes  Hemodialysis Standing Orders Initiated Yes  ECG (Telemetry) Monitor On Yes  Prime Ordered Normal Saline  Length of  DialysisTreatment -hour(s) 3.5 Hour(s)  Dialyzer Elisio 17H NR  Dialysate 2K, 2.5 Ca  Dialysis Anticoagulant None  Dialysate Flow Ordered 800  Blood Flow Rate Ordered 400 mL/min  Ultrafiltration Goal 1.5 Liters  Pre Treatment Labs Phosphorus  Dialysis Blood Pressure Support Ordered Normal Saline  Education / Care Plan  Dialysis Education Provided Yes  Documented Education in Care Plan Yes  Fistula / Graft Left Upper arm Arteriovenous fistula   Placement Date: 08/13/15   Placed prior to admission: Yes  Orientation: Left  Access Location: Upper arm  Access Type: Arteriovenous fistula  Site Condition No complications  Fistula / Graft Assessment Present;Thrill;Bruit  Drainage Description None

## 2018-03-10 NOTE — BH Assessment (Signed)
Patient is to be admitted to [facility] by Dr. Weber Cooks.  Attending Physician will be Dr. Bary Leriche.   Patient has been assigned to room 301, by [place]   Intake Paper Work has been signed and placed on patient chart.

## 2018-03-10 NOTE — Progress Notes (Signed)
Polycystic kidney, HTN, BIPOLAR, ESRD, admitted with intentional overdose  Pt is concerned about brownish urine. Says she has kidney  cysts . She is able to give a good history with some lapses  No fever before hospitalization No pain at the site of dialysis No nausea or vomiting   Abtx:  Anti-infectives (From admission, onward)   Start     Dose/Rate Route Frequency Ordered Stop   03/05/18 0900  cefTRIAXone (ROCEPHIN) 2 g in sodium chloride 0.9 % 100 mL IVPB     2 g 200 mL/hr over 30 Minutes Intravenous Daily 03/05/18 0849     03/05/18 0830  vancomycin (VANCOCIN) IVPB 1000 mg/200 mL premix  Status:  Discontinued     1,000 mg 200 mL/hr over 60 Minutes Intravenous  Once 03/05/18 0828 03/05/18 0849     OBJECTIVE: BP (!) 160/63 (BP Location: Right Arm)   Pulse 92   Temp 98.4 F (36.9 C) (Oral)   Resp 18   Ht 5\' 3"  (1.6 m)   Wt 66.7 kg   SpO2 96%   BMI 26.05 kg/m     Physical Exam Awake and alert and oriented X 5 Eyes: PERL ENT tongue moist, dental hygiene okay no maxillary tenderness, nasal cannula CVS- s1s2 ,  Left AV fistula RS b/l air entry- decreased abses GI - soft no tenderness GU- no foley MSK- no leg edema SKIN-no rash Neurologic- non focal Psychiatric- calm  Lymphatic no LN palpable  Lab Results CBC    Component Value Date/Time   WBC 9.2 03/10/2018 0340   RBC 2.86 (L) 03/10/2018 0340   HGB 9.6 (L) 03/10/2018 0340   HGB 10.3 (L) 11/09/2014 1027   HCT 28.0 (L) 03/10/2018 0340   HCT 31.9 (L) 11/09/2014 1027   PLT 236 03/10/2018 0340   PLT 248 11/09/2014 1027   MCV 97.7 03/10/2018 0340   MCV 94 11/09/2014 1027   MCH 33.7 03/10/2018 0340   MCHC 34.4 03/10/2018 0340   RDW 13.7 03/10/2018 0340   RDW 13.5 11/09/2014 1027   LYMPHSABS 1.1 12/14/2017 2240   LYMPHSABS 1.6 05/11/2014 0355   MONOABS 0.5 12/14/2017 2240   MONOABS 0.6 05/11/2014 0355   EOSABS 0.1 12/14/2017 2240   EOSABS 0.1 05/11/2014 0355   BASOSABS 0.0 12/14/2017 2240   BASOSABS 0.0  05/11/2014 0355    CMP Latest Ref Rng & Units 03/09/2018 03/08/2018 03/06/2018  Glucose 70 - 99 mg/dL 93 103(H) 141(H)  BUN 8 - 23 mg/dL 37(H) 70(H) 24(H)  Creatinine 0.44 - 1.00 mg/dL 3.16(H) 5.42(H) 3.47(H)  Sodium 135 - 145 mmol/L 140 146(H) 141  Potassium 3.5 - 5.1 mmol/L 3.6 3.8 3.9  Chloride 98 - 111 mmol/L 101 106 104  CO2 22 - 32 mmol/L 31 26 28   Calcium 8.9 - 10.3 mg/dL 8.9 8.9 9.1  Total Protein 6.5 - 8.1 g/dL - - -  Total Bilirubin 0.3 - 1.2 mg/dL - - -  Alkaline Phos 38 - 126 U/L - - -  AST 15 - 41 U/L - - -  ALT 0 - 44 U/L - - -     Blood Culture 8/15 -I bottle has strep viridans    Component Value Date/Time   SDES BLOOD RIGHT HAND 03/05/2018 0203   SPECREQUEST  03/05/2018 0203    BOTTLES DRAWN AEROBIC ONLY Blood Culture results may not be optimal due to an inadequate volume of blood received in culture bottles   CULT  03/05/2018 0203    NO GROWTH 5 DAYS  Performed at [facility], [address] [place], [place] [address]    REPTSTATUS 03/10/2018 FINAL 03/05/2018 0203   8/16 BC neg  Urine culture 8/15 NG  Radiographs and labs were personally reviewed by me.  CXR- cardiomegaly with no infiltrate  Assessment and Plan 72 y.o. female with h/o bipolar disorder ESRD on dialysis Brought to the ED after being found unresponsive  with pills around her on 03/04/18. Pt was just discharged on 03/03/18 to live independently after being in the behavioral until for 4 days for manic episode Her family saw her at 5 pm that evening.  Intentional overdose with unresponsiveness  needing intubation to protect airways- s/p extubation-  Awake and alert- waiting to be transferred to Psych floor  Strep viridans bacteremia- 1 bottle only-likely a contaminant . Today able to obtain complete history and no evidence to suggest an infection. No h/o fever before and was just discharged from Psychiatric unit  the previous evening and was not on any antibiotics--repeat blood  culture before starting antibiotic negative and 2 d echo no vegetation. On day 6 of ceftriaxone- can be discontinued tomorrow.    Bipolar disorder -was on carbamazepine, haldol  ESRD on dialysis- has left AV fistula site okay  Polycystic kidney-   Discussed the management with patient. Will sign off- call if needed

## 2018-03-10 NOTE — Progress Notes (Signed)
Pnt sent to dialysis this evening. Gave report to [facility] in [facility] who is taking pnt directly from dialysis.

## 2018-03-10 NOTE — Progress Notes (Signed)
Made patient's son [name] aware that patient to discharge to inpatient behavioral yet after dialysis.  Also made of him aware of room number.  Clarise Cruz, RN, BSN

## 2018-03-10 NOTE — Consult Note (Signed)
[facility] Face-to-Face Psychiatry Consult   Reason for Consult: Consult for this patient with long history of bipolar disorder now recovering from suicide attempt.  Follow-up on previous notes. Referring Physician: Salary Patient Identification: Shamonica Schadt MRN:  [id number] Principal Diagnosis: Bipolar 1 disorder, depressed ([place]) Diagnosis:   Patient Active Problem List   Diagnosis Date Noted  . Suicide attempt ([place]) [T14.91XA] 03/09/2018  . Acute respiratory failure with hypoxia ([place]) [J96.01] 03/04/2018  . Bipolar I disorder, current or most recent episode manic, with psychotic features ([place]) [F31.2] 02/26/2018  . UTI (urinary tract infection) [N39.0] 12/16/2017  . Bipolar 1 disorder, depressed ([place]) [F31.9] 12/15/2017  . Palliative care encounter [Z51.5]   . Bipolar affective disorder, current episode depressed with psychotic symptoms ([place]) [F31.2] 09/17/2017  . Bipolar I disorder, most recent episode (or current) manic ([place]) [F31.10] 09/02/2017  . Acute delirium [R41.0] 09/02/2017  . Altered mental status [R41.82]   . Acute encephalopathy [G93.40] 09/01/2017  . ESRD on dialysis ([place]) [N18.6, Z99.2] 04/06/2017  . Complication of vascular access for dialysis [T82.9XXA] 04/06/2017  . Hematuria [R31.9] 12/18/2016  . Polycystic kidney [Q61.3] 10/12/2016  . Ruptured cyst of kidney [Q61.00] 10/03/2016  . HTN (hypertension) [I10] 10/01/2016  . Chronic kidney disease [N18.9] 10/01/2016  . Anemia associated with chronic renal failure [N18.9, D63.1] 09/24/2016  . Acute kidney insufficiency [N28.9] 07/28/2016  . Noninfectious diarrhea [K52.9]   . Benign neoplasm of cecum [D12.0]   . Benign neoplasm of ascending colon [D12.2]   . Diarrhea [R19.7]   . Nausea [R11.0]   . Gastric polyp [K31.7]   . Chronic constipation [K59.09] 12/27/2014  . Nausea with vomiting [R11.2] 12/27/2014  . Left sided abdominal pain [R10.9] 12/27/2014  . Personal history of colonic polyps [Z86.010] 05/10/2013     Total Time spent with patient: 30 minutes  Subjective:   Joyce [name] is a 72 y.o. female patient admitted with "when am I going to go downstairs?".  HPI: Patient seen chart reviewed.  See previous notes.  Patient with bipolar disorder who made a very serious suicide attempt by overdose on multiple medications and wound up in the intensive care unit for several days.  She is now off of oxygen and is medically stabilized.  Patient is still dysphoric and somewhat negative.  She is also still rather confused today.  She knows where she is and understands the basic situation but makes quite a few mistakes in her speech.  She denies to me that she is having hallucinations but her daughter-in-law who is present confirms that she has been seeing things earlier today.  Patient is vague about suicidal wish or intent at this point.  Social history: On attempt had been made recently to have her stay with her son and daughter-in-law which the patient did not tolerate well.  On the other hand this recent episode seems to suggest that she no longer has the capacity to live independently.  Placement will be a major issue at this point.  Medical history: Patient is on dialysis.  She has dialysis scheduled for today I believe.  Multiple other chronic medical problems.  Substance abuse history: None  Past Psychiatric History: Long history of bipolar disorder which had been stable for years but began to decline around the time she started dialysis.  This year has struggled with severe depressions at times with psychotic features.  Improved with ECT but did not follow up with outpatient ECT.  Had another recent hospitalization but now a suicide attempt.  Risk to Self:   Risk to Others:   Prior Inpatient Therapy:   Prior Outpatient Therapy:    Past Medical History:  Past Medical History:  Diagnosis Date  . Anal fissure   . Bipolar affective disorder ([place])   . CKD (chronic kidney disease)    Dr Holley Raring  [name] 4  . Colon polyps   . Diverticulitis   . Diverticulitis   . Family history of adverse reaction to anesthesia    mom - PONV  . GERD (gastroesophageal reflux disease)   . Headache    migraines - none over 10 yrs  . Heart murmur   . History of hiatal hernia   . Hypertension   . Pancreatitis    Valproic acid  . Vertigo     Past Surgical History:  Procedure Laterality Date  . A/V FISTULAGRAM Left 04/14/2017   Procedure: A/V Fistulagram;  Surgeon: Katha Cabal, MD;  Location: [facility];  Service: Cardiovascular;  Laterality: Left;  . A/V FISTULAGRAM Left 06/09/2017   Procedure: A/V FISTULAGRAM;  Surgeon: Katha Cabal, MD;  Location: [facility] [facility];  Service: Cardiovascular;  Laterality: Left;  . A/V FISTULAGRAM Left 11/02/2017   Procedure: A/V FISTULAGRAM;  Surgeon: Algernon Huxley, MD;  Location: [facility];  Service: Cardiovascular;  Laterality: Left;  . A/V SHUNT INTERVENTION N/A 11/02/2017   Procedure: A/V SHUNT INTERVENTION;  Surgeon: Algernon Huxley, MD;  Location: [place] [facility];  Service: Cardiovascular;  Laterality: N/A;  . ABDOMINAL HYSTERECTOMY  1990 ?  . AV FISTULA PLACEMENT  4/30  . BREAST EXCISIONAL BIOPSY Left 1994   neg surgical bx  . CHOLECYSTECTOMY  2003  . COLONOSCOPY  2014   Dr. Jamal Collin  . COLONOSCOPY WITH PROPOFOL N/A 09/24/2015   Procedure: COLONOSCOPY WITH random colon byopies.;  Surgeon: Lucilla Lame, MD;  Location: [place];  Service: Endoscopy;  Laterality: N/A;  . ESOPHAGOGASTRODUODENOSCOPY (EGD) WITH PROPOFOL N/A 09/24/2015   Procedure: ESOPHAGOGASTRODUODENOSCOPY (EGD) ;  Surgeon: Lucilla Lame, MD;  Location: [place];  Service: Endoscopy;  Laterality: N/A;  . EYE SURGERY    . PERIPHERAL VASCULAR CATHETERIZATION N/A 05/29/2015   Procedure: A/V Shuntogram/Fistulagram;  Surgeon: Katha Cabal, MD;  Location: [facility];  Service: Cardiovascular;  Laterality: N/A;  . PERIPHERAL VASCULAR  CATHETERIZATION N/A 05/29/2015   Procedure: A/V Shunt Intervention;  Surgeon: Katha Cabal, MD;  Location: [facility];  Service: Cardiovascular;  Laterality: N/A;  . POLYPECTOMY  09/24/2015   Procedure: POLYPECTOMY INTESTINAL;  Surgeon: Lucilla Lame, MD;  Location: [place];  Service: Endoscopy;;  cecal polyp ascending polyp   Family History:  Family History  Problem Relation Age of Onset  . Stroke Father   . Hypertension Father   . Breast cancer Other   . Colon cancer Neg Hx   . Liver disease Neg Hx    Family Psychiatric  History: Positive for mood disorder Social History:  Social History   Substance and Sexual Activity  Alcohol Use No  . Alcohol/week: 0.0 standard drinks     Social History   Substance and Sexual Activity  Drug Use No    Social History   Socioeconomic History  . Marital status: Single    Spouse name: Not on file  . Number of children: 1  . Years of education: Not on file  . Highest education level: Not on file  Occupational History  . Occupation: Scientist, water quality  Employer: [employer]  Social Needs  . Financial resource strain: Not on file  . Food insecurity:    Worry: Not on file    Inability: Not on file  . Transportation needs:    Medical: Not on file    Non-medical: Not on file  Tobacco Use  . Smoking status: Never Smoker  . Smokeless tobacco: Never Used  Substance and Sexual Activity  . Alcohol use: No    Alcohol/week: 0.0 standard drinks  . Drug use: No  . Sexual activity: Not Currently  Lifestyle  . Physical activity:    Days per week: Not on file    Minutes per session: Not on file  . Stress: Not on file  Relationships  . Social connections:    Talks on phone: Not on file    Gets together: Not on file    Attends religious service: Not on file    Active member of club or organization: Not on file    Attends meetings of clubs or organizations: Not on file    Relationship status: Not on file  Other Topics Concern   . 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   Additional Social History:    Allergies:   Allergies  Allergen Reactions  . Morphine And Related Shortness Of Breath    Pt reports chest pain and difficulty breathing.  . Indomethacin Hives  . Pollen Extract Other (See Comments)    Sinus problems and HA    Labs:  Results for orders placed or performed during the hospital encounter of 03/04/18 (from the past 48 hour(s))  Glucose, capillary     Status: None   Collection Time: 03/08/18  4:18 PM  Result Value Ref Range   Glucose-Capillary 83 70 - 99 mg/dL  Glucose, capillary     Status: None   Collection Time: 03/08/18  8:20 PM  Result Value Ref Range   Glucose-Capillary 82 70 - 99 mg/dL  Glucose, capillary     Status: None   Collection Time: 03/08/18 11:17 PM  Result Value Ref Range   Glucose-Capillary 99 70 - 99 mg/dL  Glucose, capillary     Status: None   Collection Time: 03/09/18  4:56 AM  Result Value Ref Range   Glucose-Capillary 81 70 - 99 mg/dL  Basic metabolic panel     Status: Abnormal   Collection Time: 03/09/18  5:01 AM  Result Value Ref Range   Sodium 140 135 - 145 mmol/L   Potassium 3.6 3.5 - 5.1 mmol/L   Chloride 101 98 - 111 mmol/L   CO2 31 22 - 32 mmol/L   Glucose, Bld 93 70 - 99 mg/dL   BUN 37 (H) 8 - 23 mg/dL   Creatinine, Ser 3.16 (H) 0.44 - 1.00 mg/dL   Calcium 8.9 8.9 - 10.3 mg/dL   GFR calc non Af Amer 14 (L) >60 mL/min   GFR calc Af Amer 16 (L) >60 mL/min    Comment: (NOTE) The eGFR has been calculated using the CKD EPI equation. This calculation has not been validated in all clinical situations. eGFR's persistently <60 mL/min signify possible Chronic Kidney Disease.    Anion gap 8 5 - 15    Comment: Performed at [facility], [place], [place] [address]  Glucose, capillary     Status: None   Collection Time: 03/09/18  7:43 AM  Result Value Ref Range   Glucose-Capillary 84 70 - 99 mg/dL  Glucose, capillary     Status: Abnormal   Collection Time: 03/09/18 11:39 AM  Result Value Ref Range   Glucose-Capillary 114 (H) 70 - 99 mg/dL  Glucose, capillary     Status: None   Collection Time: 03/09/18  4:33 PM  Result Value Ref Range   Glucose-Capillary 99 70 - 99 mg/dL  Glucose, capillary     Status: Abnormal   Collection Time: 03/09/18  8:18 PM  Result Value Ref Range   Glucose-Capillary 119 (H) 70 - 99 mg/dL  Glucose, capillary     Status: Abnormal   Collection Time: 03/09/18 11:56 PM  Result Value Ref Range   Glucose-Capillary 144 (H) 70 - 99 mg/dL  CBC     Status: Abnormal   Collection Time: 03/10/18  3:40 AM  Result Value Ref Range   WBC 9.2 3.6 - 11.0 K/uL   RBC 2.86 (L) 3.80 - 5.20 MIL/uL   Hemoglobin 9.6 (L) 12.0 - 16.0 g/dL   HCT 28.0 (L) 35.0 - 47.0 %   MCV 97.7 80.0 - 100.0 fL   MCH 33.7 26.0 - 34.0 pg   MCHC 34.4 32.0 - 36.0 g/dL   RDW 13.7 11.5 - 14.5 %   Platelets 236 150 - 440 K/uL    Comment: Performed at [facility], [address], [place], [place] [address]  Phosphorus     Status: None   Collection Time: 03/10/18  3:40 AM  Result Value Ref Range   Phosphorus 3.1 2.5 - 4.6 mg/dL    Comment: Performed at [facility], [address] [place], [place] [address]  Glucose, capillary     Status: Abnormal   Collection Time: 03/10/18  4:14 AM  Result Value Ref Range   Glucose-Capillary 107 (H) 70 - 99 mg/dL  Glucose, capillary     Status: Abnormal   Collection Time: 03/10/18  7:52 AM  Result Value Ref Range   Glucose-Capillary 107 (H) 70 - 99 mg/dL    Current Facility-Administered Medications  Medication Dose Route Frequency Provider Last Rate Last Dose  . acetaminophen (TYLENOL) tablet 650 mg  650 mg Oral Q6H PRN Demetrios Loll, MD   650 mg at 03/10/18 1047   Or  . acetaminophen (TYLENOL) suppository 650 mg  650 mg Rectal Q6H PRN Demetrios Loll, MD      . brimonidine  (ALPHAGAN) 0.2 % ophthalmic solution 1 drop  1 drop Both Eyes BID Salary, Montell D, MD   1 drop at 03/10/18 1054  . carbamazepine (TEGRETOL) tablet 200 mg  200 mg Oral TID Demetrios Loll, MD   200 mg at 03/10/18 1048  . cefTRIAXone (ROCEPHIN) 2 g in sodium chloride 0.9 % 100 mL IVPB  2 g Intravenous Daily Conforti, Fama Muenchow, DO   Stopped at 03/10/18 1059  . chlorhexidine gluconate (MEDLINE KIT) (PERIDEX) 0.12 % solution 15 mL  15 mL Mouth Rinse BID Demetrios Loll, MD   15 mL at 03/08/18 2130  . dicyclomine (BENTYL) tablet 20 mg  20 mg Oral TID Otho Perl, MD   20 mg at 03/10/18 1048  . epoetin alfa (EPOGEN,PROCRIT) injection 4,000 Units  4,000 Units Intravenous Q M,W,F-HD [name], MD   4,000 Units at 03/08/18 1238  . famotidine (PEPCID) tablet 20 mg  20 mg Oral Daily Demetrios Loll, MD   20 mg at 03/10/18 1047  . feeding supplement (NEPRO CARB STEADY) liquid 237 mL  237 mL Oral BID BM Conforti, Macaulay Reicher, DO   237 mL at 03/09/18 2243  . haloperidol (HALDOL) tablet 2.5 mg  2.5 mg Oral Q M,W,F Demetrios Loll, MD   2.5 mg at 03/10/18 1242  . heparin injection 5,000 Units  5,000 Units Subcutaneous Q8H Demetrios Loll, MD   5,000 Units at 03/09/18 2213  . hydrALAZINE (APRESOLINE) injection 10 mg  10 mg Intravenous Q4H PRN Tukov-Yual, Magdalene S, NP   10 mg at 03/09/18 0918  . ipratropium-albuterol (DUONEB) 0.5-2.5 (3) MG/3ML nebulizer solution 3 mL  3 mL Nebulization Q4H PRN Darel Hong D, NP   3 mL at 03/04/18 1727  . MEDLINE mouth rinse  15 mL Mouth Rinse 10 times per day Demetrios Loll, MD   15 mL at 03/09/18 2223  . metoprolol tartrate (LOPRESSOR) tablet 25 mg  25 mg Oral BID Demetrios Loll, MD   25 mg at 03/10/18 1047  . ondansetron (ZOFRAN) injection 4 mg  4 mg Intravenous Q6H PRN Demetrios Loll, MD      . pentafluoroprop-tetrafluoroeth (GEBAUERS) aerosol   Topical PRN Anthonette Legato, MD      . rOPINIRole (REQUIP) tablet 2 mg  2 mg Oral Daily Demetrios Loll, MD   2 mg at 03/10/18 1047  . timolol (TIMOPTIC) 0.5 % ophthalmic  solution 1 drop  1 drop Both Eyes BID Salary, Montell D, MD   1 drop at 03/10/18 1054    Musculoskeletal: Strength & Muscle Tone: decreased Gait & Station: unsteady Patient leans: N/A  Psychiatric Specialty Exam: Physical Exam  Nursing note and vitals reviewed. Constitutional: She appears well-developed and well-nourished.  HENT:  Head: Normocephalic and atraumatic.  Eyes: Pupils are equal, round, and reactive to light. Conjunctivae are normal.  Neck: Normal range of motion.  Cardiovascular: Regular rhythm and normal heart sounds.  Respiratory: Effort normal. No respiratory distress.  GI: Soft.  Musculoskeletal: Normal range of motion.  Neurological: She is alert.  Skin: Skin is warm and dry.  Psychiatric: Her affect is blunt. Her speech is tangential. She is slowed. She is not agitated and not aggressive. Thought content is paranoid. Cognition and memory are impaired. She expresses impulsivity. She expresses suicidal ideation. She expresses no homicidal ideation. She exhibits abnormal recent memory.    Review of Systems  Constitutional: Negative.   HENT: Negative.   Eyes: Negative.   Respiratory: Negative.   Cardiovascular: Negative.   Gastrointestinal: Negative.   Musculoskeletal: Negative.   Skin: Negative.   Neurological: Positive for dizziness.  Psychiatric/Behavioral: Positive for depression, hallucinations, memory loss and suicidal ideas. Negative for substance abuse. The patient is nervous/anxious. The patient does not have insomnia.     Blood pressure (!) 160/63, pulse 92, temperature 98.4 F (36.9 C), temperature source Oral, resp. rate 18, height '5\' 3"'$  (1.6 m), weight 66.7 kg, SpO2 96 %.Body mass index is 26.05 kg/m.  General Appearance: Casual  Eye Contact:  Fair  Speech:  Slow  Volume:  Decreased  Mood:  Dysphoric  Affect:  Constricted  Thought Process:  Disorganized  Orientation:  Full (Time, Place, and Person)  Thought Content:  Tangential  Suicidal  Thoughts:  Yes.  without intent/plan  Homicidal Thoughts:  No  Memory:  Immediate;   Fair Recent;   Fair Remote;   Fair  Judgement:  Impaired  Insight:  Shallow  Psychomotor Activity:  Decreased  Concentration:  Concentration: Poor  Recall:  Poor  Fund of Knowledge:  Fair  Language:  Fair  Akathisia:  No  Handed:  Right  AIMS (if indicated):     Assets:  Desire for Improvement Resilience Social Support  ADL's:  Impaired  Cognition:  Impaired,  Mild  Sleep:        Treatment Plan Summary: Daily contact with patient to assess and evaluate symptoms and progress in treatment, Medication management and Plan Patient with bipolar disorder recovering from a suicide attempt.  Medically stabilizing.  She is apparently going to have dialysis today but afterwards can be transferred to the psychiatric unit.  No commitment papers were filed and so I told the patient and I trust that she would sign voluntarily.  Hopefully she will do that because if she does not I will be compelled to file commitment papers.  Continuing current medicine for now.  Case discussed with Dr. Mamie Nick as well.  We agree that given this recent series of events it is probably best that she come downstairs to our unit rather than start over with a new psychiatric team at any other facility.  Family understands plan.  Disposition: Recommend psychiatric Inpatient admission when medically cleared. Supportive therapy provided about ongoing stressors.  Alethia Berthold, MD 03/10/2018 1:55 PM

## 2018-03-10 NOTE — Progress Notes (Signed)
Speech Language Pathology Treatment: Dysphagia  Patient Details Name: Joyce [name] MRN: [id number] DOB: 11-05-1945 Today's Date: 03/10/2018 Time: 1320-1400 SLP Time Calculation (min) (ACUTE ONLY): 40 min  Assessment / Plan / Recommendation Clinical Impression  Pt seen for ongoing assessment of toleration of diet; education on aspiration precautions. Consulted to NSG of pt's progress today post evaluation yesterday, and NSG reported minimal oral intake in last two meals. Pt is about to discharge to Pyschiatric Unit today. Pt appeared somewhat tired and stated she "wasn't very hungry". MDs report episodes of severe depression including this admission.  Pt appears to present w/ adequate oropharyngeal phase swallow function w/ reduced risk for aspiration when following general aspiration precautions. Less min involuntary head movements during tasks noted today. Pt consumed trials of thin liquids VIA CUP, then straw, w/ no overt s/s of aspiration noted; clear vocal quality post trials and no decline in respiratory status during/post trials. Pt then ate few bites of soft solids w/out deficits noted as well. Again, pt stated she was not hungry for the lunch meal earlier. Discussed foods she may be more interested in at meals and encouraged her, w/ NSG help, to order those in her diet. No oral phase deficits noted. Pt needed min time b/t trials to regulate self and the task. Educated pt on eating foods that are more moist, soft d/t dentition status. Encouraged Soups, less tougher meats. Pt fed self w/ setup.  Recommend a Mech Soft diet w/ thin liquids, aspiration precautions, Pills in Puree for easier swallowing. Feeding Support at meals while weak; tray setup d/t weakness overall. Pt appears to be at her baseline re: swallowing. No further skilled ST Services indicated currently. Recommend Dietitian f/u for nutritional support. NSG updated.     HPI HPI: Pt is an 72 y.o. female. with a past medical  history remarkable for multiple medical issues including Bipolar disorder, chronic renal failure, on hemodialysis, Monday once a Friday, who presented to [facility] ED on 03/04/18 after being found unresponsive by her family. The patient was found with pills scattered around her in the am 8/15; concern for overdose - Psychiatry is following.  Family had last talked to the patient at 1700 on 03/03/18, of which she seemed in normal spirits.  It is noted that she was discharged from [facility] on 03/03/18.  Pt denied any swallowing problems prior.  Pt was orally intubated for ~5 days post admission for airway protection; extubated on the 19th.  Currently, verbally conversive w/ distraction but easily reattended to tasks w/ verbal cues.  Pt admitted w/ encephalopathy per MD notes.       SLP Plan  All goals met       Recommendations  Diet recommendations: Dysphagia 3 (mechanical soft);Thin liquid Liquids provided via: Cup(monitor any straw use) Medication Administration: Whole meds with puree(for safer swallowing) Supervision: Patient able to self feed;Intermittent supervision to cue for compensatory strategies Compensations: Minimize environmental distractions;Slow rate;Small sips/bites;Lingual sweep for clearance of pocketing;Multiple dry swallows after each bite/sip;Follow solids with liquid Postural Changes and/or Swallow Maneuvers: Seated upright 90 degrees;Upright 30-60 min after meal                General recommendations: (dietician f/u) Oral Care Recommendations: Oral care BID;Patient independent with oral care;Staff/trained caregiver to provide oral care Follow up Recommendations: None SLP Visit Diagnosis: Dysphagia, unspecified (R13.10)(declined Cognitive status) Plan: All goals met       GO  Orinda Kenner, MS, CCC-SLP Watson,Katherine 03/10/2018, 3:27 PM

## 2018-03-11 ENCOUNTER — Encounter: Payer: Self-pay | Admitting: Psychiatry

## 2018-03-11 ENCOUNTER — Inpatient Hospital Stay
Admission: RE | Admit: 2018-03-11 | Discharge: 2018-03-19 | DRG: 885 | Disposition: A | Discharge status: Home / Self Care | Payer: Medicare HMO | Source: Intra-hospital | Attending: Psychiatry | Admitting: Psychiatry

## 2018-03-11 ENCOUNTER — Other Ambulatory Visit: Payer: Self-pay

## 2018-03-11 DIAGNOSIS — N186 End stage renal disease: Secondary | ICD-10-CM | POA: Diagnosis present

## 2018-03-11 DIAGNOSIS — Z803 Family history of malignant neoplasm of breast: Secondary | ICD-10-CM

## 2018-03-11 DIAGNOSIS — Z9049 Acquired absence of other specified parts of digestive tract: Secondary | ICD-10-CM | POA: Diagnosis not present

## 2018-03-11 DIAGNOSIS — R1310 Dysphagia, unspecified: Secondary | ICD-10-CM | POA: Diagnosis present

## 2018-03-11 DIAGNOSIS — F3130 Bipolar disorder, current episode depressed, mild or moderate severity, unspecified: Secondary | ICD-10-CM | POA: Diagnosis present

## 2018-03-11 DIAGNOSIS — Z8249 Family history of ischemic heart disease and other diseases of the circulatory system: Secondary | ICD-10-CM | POA: Diagnosis not present

## 2018-03-11 DIAGNOSIS — X58XXXD Exposure to other specified factors, subsequent encounter: Secondary | ICD-10-CM | POA: Diagnosis present

## 2018-03-11 DIAGNOSIS — H409 Unspecified glaucoma: Secondary | ICD-10-CM | POA: Diagnosis present

## 2018-03-11 DIAGNOSIS — K589 Irritable bowel syndrome without diarrhea: Secondary | ICD-10-CM | POA: Diagnosis present

## 2018-03-11 DIAGNOSIS — T1491XA Suicide attempt, initial encounter: Secondary | ICD-10-CM | POA: Diagnosis present

## 2018-03-11 DIAGNOSIS — F315 Bipolar disorder, current episode depressed, severe, with psychotic features: Principal | ICD-10-CM

## 2018-03-11 DIAGNOSIS — G2581 Restless legs syndrome: Secondary | ICD-10-CM | POA: Diagnosis present

## 2018-03-11 DIAGNOSIS — N2581 Secondary hyperparathyroidism of renal origin: Secondary | ICD-10-CM | POA: Diagnosis not present

## 2018-03-11 DIAGNOSIS — Z885 Allergy status to narcotic agent status: Secondary | ICD-10-CM

## 2018-03-11 DIAGNOSIS — Z992 Dependence on renal dialysis: Secondary | ICD-10-CM

## 2018-03-11 DIAGNOSIS — Q613 Polycystic kidney, unspecified: Secondary | ICD-10-CM

## 2018-03-11 DIAGNOSIS — Z888 Allergy status to other drugs, medicaments and biological substances status: Secondary | ICD-10-CM | POA: Diagnosis not present

## 2018-03-11 DIAGNOSIS — D631 Anemia in chronic kidney disease: Secondary | ICD-10-CM | POA: Diagnosis present

## 2018-03-11 DIAGNOSIS — I10 Essential (primary) hypertension: Secondary | ICD-10-CM | POA: Diagnosis present

## 2018-03-11 DIAGNOSIS — Z90710 Acquired absence of both cervix and uterus: Secondary | ICD-10-CM

## 2018-03-11 DIAGNOSIS — K219 Gastro-esophageal reflux disease without esophagitis: Secondary | ICD-10-CM | POA: Diagnosis present

## 2018-03-11 DIAGNOSIS — Z9181 History of falling: Secondary | ICD-10-CM | POA: Diagnosis not present

## 2018-03-11 DIAGNOSIS — Z86010 Personal history of colonic polyps: Secondary | ICD-10-CM | POA: Diagnosis not present

## 2018-03-11 DIAGNOSIS — Z823 Family history of stroke: Secondary | ICD-10-CM

## 2018-03-11 DIAGNOSIS — R45851 Suicidal ideations: Secondary | ICD-10-CM | POA: Diagnosis present

## 2018-03-11 DIAGNOSIS — Z79899 Other long term (current) drug therapy: Secondary | ICD-10-CM | POA: Diagnosis not present

## 2018-03-11 DIAGNOSIS — R42 Dizziness and giddiness: Secondary | ICD-10-CM | POA: Diagnosis not present

## 2018-03-11 DIAGNOSIS — T148XXD Other injury of unspecified body region, subsequent encounter: Secondary | ICD-10-CM

## 2018-03-11 DIAGNOSIS — I120 Hypertensive chronic kidney disease with stage 5 chronic kidney disease or end stage renal disease: Secondary | ICD-10-CM | POA: Diagnosis present

## 2018-03-11 MED ORDER — BRIMONIDINE TARTRATE 0.2 % OP SOLN
1.0000 [drp] | Freq: Two times a day (BID) | OPHTHALMIC | Status: DC
  Administered 2018-03-11 – 2018-03-19 (×16): 1 [drp] via OPHTHALMIC
  Filled 2018-03-11 (×2): qty 5

## 2018-03-11 MED ORDER — RENA-VITE PO TABS
1.0000 | ORAL_TABLET | Freq: Every day | ORAL | Status: DC
  Administered 2018-03-11 – 2018-03-18 (×8): 1 via ORAL
  Filled 2018-03-11 (×9): qty 1

## 2018-03-11 MED ORDER — PENTAFLUOROPROP-TETRAFLUOROETH EX AERO
INHALATION_SPRAY | CUTANEOUS | Status: DC | PRN
  Administered 2018-03-17 – 2018-03-19 (×2): via TOPICAL
  Filled 2018-03-11 (×2): qty 103.5

## 2018-03-11 MED ORDER — FAMOTIDINE 20 MG PO TABS
20.0000 mg | ORAL_TABLET | Freq: Every day | ORAL | Status: DC
  Administered 2018-03-11 – 2018-03-19 (×8): 20 mg via ORAL
  Filled 2018-03-11 (×8): qty 1

## 2018-03-11 MED ORDER — METOPROLOL TARTRATE 25 MG PO TABS
25.0000 mg | ORAL_TABLET | Freq: Two times a day (BID) | ORAL | Status: DC
  Administered 2018-03-11 – 2018-03-19 (×6): 25 mg via ORAL
  Filled 2018-03-11 (×10): qty 1

## 2018-03-11 MED ORDER — ALUM & MAG HYDROXIDE-SIMETH 200-200-20 MG/5ML PO SUSP
30.0000 mL | ORAL | Status: DC | PRN
  Administered 2018-03-14: 30 mL via ORAL
  Filled 2018-03-11: qty 30

## 2018-03-11 MED ORDER — DICYCLOMINE HCL 20 MG PO TABS
20.0000 mg | ORAL_TABLET | Freq: Three times a day (TID) | ORAL | Status: DC
  Administered 2018-03-11 – 2018-03-19 (×20): 20 mg via ORAL
  Filled 2018-03-11 (×27): qty 1

## 2018-03-11 MED ORDER — HALOPERIDOL 1 MG PO TABS
2.5000 mg | ORAL_TABLET | ORAL | Status: DC
  Administered 2018-03-12: 2.5 mg via ORAL
  Filled 2018-03-11: qty 1

## 2018-03-11 MED ORDER — NEPRO/CARBSTEADY PO LIQD
237.0000 mL | Freq: Two times a day (BID) | ORAL | Status: DC
  Administered 2018-03-11 – 2018-03-19 (×16): 237 mL via ORAL

## 2018-03-11 MED ORDER — VITAMIN C 500 MG PO TABS
250.0000 mg | ORAL_TABLET | Freq: Two times a day (BID) | ORAL | Status: DC
  Administered 2018-03-11 – 2018-03-18 (×13): 250 mg via ORAL
  Administered 2018-03-18: 500 mg via ORAL
  Administered 2018-03-19: 250 mg via ORAL
  Filled 2018-03-11 (×18): qty 0.5

## 2018-03-11 MED ORDER — ZIPRASIDONE HCL 40 MG PO CAPS
40.0000 mg | ORAL_CAPSULE | Freq: Two times a day (BID) | ORAL | Status: DC
  Administered 2018-03-11 – 2018-03-12 (×2): 40 mg via ORAL
  Filled 2018-03-11 (×2): qty 1

## 2018-03-11 MED ORDER — TIMOLOL MALEATE 0.5 % OP SOLN
1.0000 [drp] | Freq: Two times a day (BID) | OPHTHALMIC | Status: DC
  Administered 2018-03-11 – 2018-03-19 (×16): 1 [drp] via OPHTHALMIC
  Filled 2018-03-11 (×2): qty 5

## 2018-03-11 MED ORDER — ROPINIROLE HCL 1 MG PO TABS
2.0000 mg | ORAL_TABLET | Freq: Every day | ORAL | Status: DC
  Administered 2018-03-11 – 2018-03-19 (×9): 2 mg via ORAL
  Filled 2018-03-11 (×9): qty 2

## 2018-03-11 MED ORDER — ACETAMINOPHEN 325 MG PO TABS
650.0000 mg | ORAL_TABLET | Freq: Four times a day (QID) | ORAL | Status: DC | PRN
  Administered 2018-03-17: 650 mg via ORAL
  Filled 2018-03-11: qty 2

## 2018-03-11 MED ORDER — MAGNESIUM HYDROXIDE 400 MG/5ML PO SUSP: 30.0000 mL | Freq: Every day | ORAL | Status: DC | PRN

## 2018-03-11 MED ORDER — CARBAMAZEPINE 200 MG PO TABS
200.0000 mg | ORAL_TABLET | Freq: Three times a day (TID) | ORAL | Status: DC
  Administered 2018-03-11 – 2018-03-17 (×16): 200 mg via ORAL
  Filled 2018-03-11 (×16): qty 1

## 2018-03-11 MED ORDER — TUBERCULIN PPD 5 UNIT/0.1ML ID SOLN
5.0000 [IU] | Freq: Once | INTRADERMAL | Status: AC
  Administered 2018-03-11: 5 [IU] via INTRADERMAL
  Filled 2018-03-11 (×2): qty 0.1

## 2018-03-11 NOTE — BHH Group Notes (Signed)
LCSW Group Therapy Note  03/11/2018 1:00 pm  Type of Therapy/Topic:  Group Therapy:  Balance in Life  Participation Level:  Did Not Attend  Description of Group:    This group will address the concept of balance and how it feels and looks when one is unbalanced. Patients will be encouraged to process areas in their lives that are out of balance and identify reasons for remaining unbalanced. Facilitators will guide patients in utilizing problem-solving interventions to address and correct the stressor making their life unbalanced. Understanding and applying boundaries will be explored and addressed for obtaining and maintaining a balanced life. Patients will be encouraged to explore ways to assertively make their unbalanced needs known to significant others in their lives, using other group members and facilitator for support and feedback.  Therapeutic Goals: 1. Patient will identify two or more emotions or situations they have that consume much of in their lives. 2. Patient will identify signs/triggers that life has become out of balance:  3. Patient will identify two ways to set boundaries in order to achieve balance in their lives:  4. Patient will demonstrate ability to communicate their needs through discussion and/or role plays  Summary of Patient Progress:      Therapeutic Modalities:   Cognitive Behavioral Therapy Solution-Focused Therapy Assertiveness Training  Devona Konig, LCSW 03/11/2018 4:04 PM

## 2018-03-11 NOTE — Progress Notes (Signed)
Recreation Therapy Notes  INPATIENT RECREATION THERAPY ASSESSMENT  Patient Details Name: Joyce [name] MRN: [id number] DOB: 24-Oct-1945 Today's Date: 03/11/2018       Information Obtained From: (Unable to complete)  Able to Participate in Assessment/Interview:    Patient Presentation:    Reason for Admission (Per Patient):    Patient Stressors:    Coping Skills:      Leisure Interests (2+):     Frequency of Recreation/Participation:    Awareness of Community Resources:     [employer]:     Current Use:    If no, Barriers?:    Expressed Interest in [place]idence:     Patient Main Form of Transportation:    Patient Strengths:     Patient Identified Areas of Improvement:     Patient Goal for Hospitalization:     Current SI (including self-harm):     Current HI:     Current AVH:    Staff Intervention Plan:    Consent to Intern Participation:    King Pinzon 03/11/2018, 2:24 PM

## 2018-03-11 NOTE — Progress Notes (Signed)
NUTRITION ASSESSMENT  Pt identified as at risk on the Malnutrition Screen Tool  INTERVENTION: Current diet order is for a GI soft diet and is not mechanical soft. Patient was just seen by SLP yesterday who recommended a dysphagia 3 diet (mechanical soft) with thin liquids. Will downgrade to that diet.  Continue Nepro Shake po BID, each supplement provides 425 kcal and 19 grams protein.  Continue Rena-vite QHS.  Continue vitamin C 250 mg BID.  NUTRITION DIAGNOSIS: Unintentional weight loss related to sub-optimal intake as evidenced by pt report.   Goal: Pt to meet >/= 90% of their estimated nutrition needs.  Monitor:  PO intake  Assessment:  72 y.o. female with PMHx of diverticulitis, ESRD on HD, pancreatitis, bipolar affective disorder, HTN, GERD, hx hiatal hernia who is now admitted involuntarily after overdose.   Patient required intubation from 8/15 to 8/19 for airway protection in setting of drug overdose. She received tube feeds while intubated. Patient is known to this RD from an assessment two days ago when she was in the ICU. She reported good appetite and intake PTA. Intake appears to be variable here in the hospital. She reports she has been weight-stable. She is amenable to drinking Nepro between meals to help meet calorie and protein needs. There has also been a concern for vitamin C deficiency so patient was started on vitamin C supplementation.  Per chart patient ate 70% of her meal last night. However she had 0% of breakfast this AM.  Medications reviewed and include: famotidine, Nepro BID, Haldol 2.5 mg every MWF for HD, Rena-vite QHS, vitamin C 250 mg BID.  Labs reviewed.  Height: Ht Readings from Last 1 Encounters:  03/11/18 5\' 3"  (1.6 m)    Weight: Wt Readings from Last 1 Encounters:  03/11/18 67 kg    Weight Hx: Wt Readings from Last 10 Encounters:  03/11/18 67 kg  03/11/18 67 kg  03/03/18 67.3 kg  02/25/18 68.5 kg  12/23/17 73.1 kg  12/14/17 74.8  kg  11/02/17 71.7 kg  10/21/17 71.7 kg  09/16/17 86.6 kg  09/02/17 87 kg    BMI:  Body mass index is 26.17 kg/m. Pt meets criteria for overweight based on current BMI.  Estimated Nutritional Needs: Kcal: 25-30 kcal/kg Protein: > 1 gram protein/kg Fluid: 1 ml/kcal  Diet Order:  Diet Order            DIET SOFT Room service appropriate? Yes; Fluid consistency: Thin  Diet effective now             Pt is also offered choice of unit snacks mid-morning and mid-afternoon.   Willey Blade, MS, [place], LDN Office: [phone] Pager: [phone] After Hours/Weekend Pager: [phone]

## 2018-03-11 NOTE — Plan of Care (Addendum)
Patient found in bed upon my arrival. Remains on and compliant with 1:1 sitter. Patient speech is garbled and confused at times. When speech is coherent, patient has no memory regarding our interactions. Mood appears good, affect is bright and animated. Patient giggles often. Especially when she is saying something unintelligible. Patient is eating adequately. Slept on and off throughout the evening. Follows direction. Compliant with HS vitamins. PPD planted in RFA @2138  03/11/18. Marked arm with X where planted. Patient can be restless and fidgety intermittently. Denies all complaints including SI/HI/AVH. 1:1 sitter at bedside. Will continue to monitor throughout the shift. Patient slept 7.75 hours. No apparent distress. No restlessness observed. Remains on 1:1 sitter. Will endorse care to oncoming shift.  Problem: Education: Goal: Emotional status will improve Outcome: Progressing   Problem: Health Behavior/Discharge Planning: Goal: Compliance with therapeutic regimen will improve Outcome: Progressing   Problem: Education: Goal: Knowledge of [facility] Education information/materials will improve Outcome: Not Progressing Goal: Mental status will improve Outcome: Not Progressing Goal: Verbalization of understanding the information provided will improve Outcome: Not Progressing   Problem: Education: Goal: Knowledge of the prescribed therapeutic regimen will improve Outcome: Not Progressing

## 2018-03-11 NOTE — H&P (Addendum)
Psychiatric Admission Assessment Adult  Patient Identification: Joyce [name] MRN:  [id number] Date of Evaluation:  03/11/2018 Chief Complaint:  Bipolar Principal Diagnosis: Bipolar I disorder, current or most recent episode depressed, with psychotic features [facility]) Diagnosis:   Patient Active Problem List   Diagnosis Date Noted  . Bipolar I disorder, current or most recent episode depressed, with psychotic features ([place]) [F31.5] 02/26/2018    Priority: High  . Bipolar affective disorder, current episode depressed with psychotic symptoms ([place]) [F31.2] 09/17/2017    Priority: High  . Bipolar I disorder, most recent episode (or current) manic ([place]) [F31.10] 09/02/2017    Priority: High  . Suicide attempt ([place]) [T14.91XA] 03/09/2018  . Acute respiratory failure with hypoxia ([place]) [J96.01] 03/04/2018  . UTI (urinary tract infection) [N39.0] 12/16/2017  . Palliative care encounter [Z51.5]   . Acute delirium [R41.0] 09/02/2017  . Altered mental status [R41.82]   . Acute encephalopathy [G93.40] 09/01/2017  . ESRD on dialysis ([place]) [N18.6, Z99.2] 04/06/2017  . Complication of vascular access for dialysis [T82.9XXA] 04/06/2017  . Hematuria [R31.9] 12/18/2016  . Polycystic kidney [Q61.3] 10/12/2016  . Ruptured cyst of kidney [Q61.00] 10/03/2016  . HTN (hypertension) [I10] 10/01/2016  . Chronic kidney disease [N18.9] 10/01/2016  . Anemia associated with chronic renal failure [N18.9, D63.1] 09/24/2016  . Acute kidney insufficiency [N28.9] 07/28/2016  . Noninfectious diarrhea [K52.9]   . Benign neoplasm of cecum [D12.0]   . Benign neoplasm of ascending colon [D12.2]   . Diarrhea [R19.7]   . Nausea [R11.0]   . Gastric polyp [K31.7]   . Chronic constipation [K59.09] 12/27/2014  . Nausea with vomiting [R11.2] 12/27/2014  . Left sided abdominal pain [R10.9] 12/27/2014  . Personal history of colonic polyps [Z86.010] 05/10/2013   History of Present Illness:   Identifying data. Joyce [name]  is a 72 year old female with a history of bipolar disorder.  Chief complaint. "I messed up, didn't I."  History of present illness. Information was obtained from the patient and the chart. The patient was hospitalized at [facility] psychiatry 8/ 03/2018 until 03/03/2018 for a hypomanic episode. At her request, she did not return to live with her son but was discharged to her own place. On the same night, she intentionally overdosed on multiple medications overwhelmed with responsibilities and bills. Her sister found her unresponsive in the morning. She was admitted to ICU. Following extubation, the patient was transferred back to psychiatry for further stabilization.  Joyce [name] has been asleep all morning. She ate very little lunch. She overwhelmed with guilt, feels that she messed up everything and that her only option is to refuse dialysis. She worries about her medical bills and tells me that she sometimes has to pay $800 per treatment???? Her income is only $1093/month and somehow she does not qualify for Medicaid. She also figured out that she would not be allowed to return home with Joyce [name] and Joyce [name]. She is very tearful about it. She felt that we will "kick her out" of the hospital today. She has been very tearful. Her decision to stop dialysis was a "practical" solution to real problems. When reassured that the children are happy to take her back, we will try to help with Medicaid and will not throw her out of the hospital, she relaxed some and wants to continue with dialysis. She seems to be coherent and focused on our conversation so far.  She then complained about "all over pain from a fall". There are multiple bruises all over her body. She started  telling me that she feel getting ready for work, that she did not have the time to call her employer and that she was surprised to wake up in the hospital. At this point, she did not remember anything about ICU or her children.  Spoke with Joyce [name], daughter in  law, to inform about patient progress and discuss SNF transfer. The family is in agreement with the plan if rehabilitation necessary.   The patient has not been able to ambulate since the overdose and SNF rehab is recommended. She is on soft diet for dysphagia.  Past psychiatric history. Long history of bipolar with multiple hospitalizations, medication trials and overdoses over the years. She received life-saving ECT in May 2019. She no longer is a patient of Dr. Thurmond Butts and was recently referred to [facility].   Family psychiatric history. None reported.  Social history. She used to live independently and work at [employer] until started dialysis in December 2018. Following discharge from psychiatry in May 2019, she stayed with her son until most recent admission.   Total Time spent with patient: 1 hour  Is the patient at risk to self? Yes.    Has the patient been a risk to self in the past 6 months? Yes.    Has the patient been a risk to self within the distant past? Yes.    Is the patient a risk to others? No.  Has the patient been a risk to others in the past 6 months? No.  Has the patient been a risk to others within the distant past? No.   Prior Inpatient Therapy:   Prior Outpatient Therapy:    Alcohol Screening: Patient refused Alcohol Screening Tool: Yes 1. How often do you have a drink containing alcohol?: Never 2. How many drinks containing alcohol do you have on a typical day when you are drinking?: 1 or 2 3. How often do you have six or more drinks on one occasion?: Never AUDIT-C Score: 0 4. How often during the last year have you found that you were not able to stop drinking once you had started?: Never 5. How often during the last year have you failed to do what was normally expected from you becasue of drinking?: Never 6. How often during the last year have you needed a first drink in the morning to get yourself going after a heavy drinking session?: Never 7. How often during the  last year have you had a feeling of guilt of remorse after drinking?: Never 8. How often during the last year have you been unable to remember what happened the night before because you had been drinking?: Never 9. Have you or someone else been injured as a result of your drinking?: No 10. Has a relative or friend or a doctor or another health worker been concerned about your drinking or suggested you cut down?: No Alcohol Use Disorder Identification Test Final Score (AUDIT): 0 Intervention/Follow-up: (NA) Substance Abuse History in the last 12 months:  No. Consequences of Substance Abuse: NA Previous Psychotropic Medications: Yes  Psychological Evaluations: No  Past Medical History:  Past Medical History:  Diagnosis Date  . Anal fissure   . Bipolar affective disorder ([place])   . CKD (chronic kidney disease)    Dr Holley Raring [name] 4  . Colon polyps   . Diverticulitis   . Diverticulitis   . Family history of adverse reaction to anesthesia    mom - PONV  . GERD (gastroesophageal reflux disease)   .  Headache    migraines - none over 10 yrs  . Heart murmur   . History of hiatal hernia   . Hypertension   . Pancreatitis    Valproic acid  . Vertigo     Past Surgical History:  Procedure Laterality Date  . A/V FISTULAGRAM Left 04/14/2017   Procedure: A/V Fistulagram;  Surgeon: Katha Cabal, MD;  Location: [place] [facility];  Service: Cardiovascular;  Laterality: Left;  . A/V FISTULAGRAM Left 06/09/2017   Procedure: A/V FISTULAGRAM;  Surgeon: Katha Cabal, MD;  Location: [place] [facility];  Service: Cardiovascular;  Laterality: Left;  . A/V FISTULAGRAM Left 11/02/2017   Procedure: A/V FISTULAGRAM;  Surgeon: Algernon Huxley, MD;  Location: [place] [facility];  Service: Cardiovascular;  Laterality: Left;  . A/V SHUNT INTERVENTION N/A 11/02/2017   Procedure: A/V SHUNT INTERVENTION;  Surgeon: Algernon Huxley, MD;  Location: [place] [facility];  Service: Cardiovascular;   Laterality: N/A;  . ABDOMINAL HYSTERECTOMY  1990 ?  . AV FISTULA PLACEMENT  4/30  . BREAST EXCISIONAL BIOPSY Left 1994   neg surgical bx  . CHOLECYSTECTOMY  2003  . COLONOSCOPY  2014   Dr. Jamal Collin  . COLONOSCOPY WITH PROPOFOL N/A 09/24/2015   Procedure: COLONOSCOPY WITH random colon byopies.;  Surgeon: Lucilla Lame, MD;  Location: [place];  Service: Endoscopy;  Laterality: N/A;  . ESOPHAGOGASTRODUODENOSCOPY (EGD) WITH PROPOFOL N/A 09/24/2015   Procedure: ESOPHAGOGASTRODUODENOSCOPY (EGD) ;  Surgeon: Lucilla Lame, MD;  Location: [place];  Service: Endoscopy;  Laterality: N/A;  . EYE SURGERY    . PERIPHERAL VASCULAR CATHETERIZATION N/A 05/29/2015   Procedure: A/V Shuntogram/Fistulagram;  Surgeon: Katha Cabal, MD;  Location: [place] [facility];  Service: Cardiovascular;  Laterality: N/A;  . PERIPHERAL VASCULAR CATHETERIZATION N/A 05/29/2015   Procedure: A/V Shunt Intervention;  Surgeon: Katha Cabal, MD;  Location: [place] [facility];  Service: Cardiovascular;  Laterality: N/A;  . POLYPECTOMY  09/24/2015   Procedure: POLYPECTOMY INTESTINAL;  Surgeon: Lucilla Lame, MD;  Location: [place];  Service: Endoscopy;;  cecal polyp ascending polyp   Family History:  Family History  Problem Relation Age of Onset  . Stroke Father   . Hypertension Father   . Breast cancer Other   . Colon cancer Neg Hx   . Liver disease Neg Hx     Tobacco Screening: Have you used any form of tobacco in the last 30 days? (Cigarettes, Smokeless Tobacco, Cigars, and/or Pipes): No Social History:  Social History   Substance and Sexual Activity  Alcohol Use No  . Alcohol/week: 0.0 standard drinks     Social History   Substance and Sexual Activity  Drug Use No    Additional Social History:                           Allergies:   Allergies  Allergen Reactions  . Morphine And Related Shortness Of Breath    Pt reports chest pain and difficulty breathing.  .  Indomethacin Hives  . Pollen Extract Other (See Comments)    Sinus problems and HA   Lab Results:  Results for orders placed or performed during the hospital encounter of 03/04/18 (from the past 48 hour(s))  Glucose, capillary     Status: Abnormal   Collection Time: 03/09/18 11:39 AM  Result Value Ref Range   Glucose-Capillary 114 (H) 70 - 99 mg/dL  Glucose, capillary  Status: None   Collection Time: 03/09/18  4:33 PM  Result Value Ref Range   Glucose-Capillary 99 70 - 99 mg/dL  Glucose, capillary     Status: Abnormal   Collection Time: 03/09/18  8:18 PM  Result Value Ref Range   Glucose-Capillary 119 (H) 70 - 99 mg/dL  Glucose, capillary     Status: Abnormal   Collection Time: 03/09/18 11:56 PM  Result Value Ref Range   Glucose-Capillary 144 (H) 70 - 99 mg/dL  CBC     Status: Abnormal   Collection Time: 03/10/18  3:40 AM  Result Value Ref Range   WBC 9.2 3.6 - 11.0 K/uL   RBC 2.86 (L) 3.80 - 5.20 MIL/uL   Hemoglobin 9.6 (L) 12.0 - 16.0 g/dL   HCT 28.0 (L) 35.0 - 47.0 %   MCV 97.7 80.0 - 100.0 fL   MCH 33.7 26.0 - 34.0 pg   MCHC 34.4 32.0 - 36.0 g/dL   RDW 13.7 11.5 - 14.5 %   Platelets 236 150 - 440 K/uL    Comment: Performed at [facility], [address], [place], [place] [address]  Phosphorus     Status: None   Collection Time: 03/10/18  3:40 AM  Result Value Ref Range   Phosphorus 3.1 2.5 - 4.6 mg/dL    Comment: Performed at [facility], [address] [place], [place] [address]  Glucose, capillary     Status: Abnormal   Collection Time: 03/10/18  4:14 AM  Result Value Ref Range   Glucose-Capillary 107 (H) 70 - 99 mg/dL  Glucose, capillary     Status: Abnormal   Collection Time: 03/10/18  7:52 AM  Result Value Ref Range   Glucose-Capillary 107 (H) 70 - 99 mg/dL    Blood Alcohol level:  Lab Results  Component Value Date   ETH <10 03/04/2018   ETH <10 86/76/7209    Metabolic Disorder Labs:  Lab Results  Component Value Date    HGBA1C 4.8 09/18/2017   MPG 91.06 09/18/2017   No results found for: PROLACTIN Lab Results  Component Value Date   CHOL 251 (H) 09/18/2017   TRIG 124 09/18/2017   HDL 66 09/18/2017   CHOLHDL 3.8 09/18/2017   VLDL 25 09/18/2017   LDLCALC 160 (H) 09/18/2017   LDLCALC 43 05/14/2014    Current Medications: Current Facility-Administered Medications  Medication Dose Route Frequency Provider Last Rate Last Dose  . acetaminophen (TYLENOL) tablet 650 mg  650 mg Oral Q6H PRN Clapacs, [name], MD      . alum & mag hydroxide-simeth (MAALOX/MYLANTA) 200-200-20 MG/5ML suspension 30 mL  30 mL Oral Q4H PRN Clapacs, [name], MD      . brimonidine (ALPHAGAN) 0.2 % ophthalmic solution 1 drop  1 drop Both Eyes BID Clapacs, [name], MD      . carbamazepine (TEGRETOL) tablet 200 mg  200 mg Oral TID Clapacs, [name], MD   200 mg at 03/11/18 4709  . dicyclomine (BENTYL) tablet 20 mg  20 mg Oral TID AC Clapacs, [name], MD   20 mg at 03/11/18 6283  . famotidine (PEPCID) tablet 20 mg  20 mg Oral Daily Clapacs, [name], MD   20 mg at 03/11/18 6629  . feeding supplement (NEPRO CARB STEADY) liquid 237 mL  237 mL Oral BID BM Clapacs, [name], MD      . Derrill Memo ON 03/12/2018] haloperidol (HALDOL) tablet 2.5 mg  2.5 mg Oral Q M,W,F Clapacs,  [name], MD      . magnesium hydroxide (MILK OF MAGNESIA) suspension 30 mL  30 mL Oral Daily PRN Clapacs, [name], MD      . metoprolol tartrate (LOPRESSOR) tablet 25 mg  25 mg Oral BID Clapacs, [name], MD   25 mg at 03/11/18 4401  . multivitamin (RENA-VIT) tablet 1 tablet  1 tablet Oral QHS Jehiel Koepp B, MD      . pentafluoroprop-tetrafluoroeth (GEBAUERS) aerosol   Topical PRN Clapacs, [name], MD      . rOPINIRole (REQUIP) tablet 2 mg  2 mg Oral Daily Clapacs, [name], MD   2 mg at 03/11/18 0272  . timolol (TIMOPTIC) 0.5 % ophthalmic solution 1 drop  1 drop Both Eyes BID Clapacs, [name], MD      . vitamin C (ASCORBIC ACID) tablet 250 mg  250 mg Oral BID Sheronda Parran B, MD        PTA Medications: Medications Prior to Admission  Medication Sig Dispense Refill Last Dose  . acetaminophen (TYLENOL) 325 MG tablet Take 325 mg 2 (two) times daily as needed by mouth for moderate pain or headache.    PRN at PRN  . brimonidine (ALPHAGAN) 0.2 % ophthalmic solution Place 1 drop into both eyes 2 (two) times daily. 5 mL 1 Unknown at Unknown  . carbamazepine (TEGRETOL) 200 MG tablet Take 1 tablet (200 mg total) by mouth 3 (three) times daily. 90 tablet 3   . cholecalciferol (VITAMIN D) 1000 UNITS tablet Take 1,000 Units by mouth daily.   Unknown at Unknown  . cyanocobalamin (,VITAMIN B-12,) 1000 MCG/ML injection Inject 1,000 mcg into the muscle every 30 (thirty) days.   Unknown at Unknown  . dicyclomine (BENTYL) 20 MG tablet Take 1 tablet (20 mg total) by mouth 3 (three) times daily before meals. 90 tablet 1 Unknown at Unknown  . famotidine (PEPCID) 20 MG tablet TAKE 1 TABLET BY MOUTH EVERY DAY 30 tablet 1 Unknown at Unknown  . haloperidol (HALDOL) 5 MG tablet Take 0.5 tablets (2.5 mg total) by mouth every Monday, Wednesday, and Friday with hemodialysis AND 1 tablet (5 mg total) at bedtime. 36 tablet 3   . lidocaine (LIDODERM) 5 % Place 1 patch onto the skin daily. Remove & Discard patch within 12 hours or as directed by MD (Patient not taking: Reported on 02/26/2018) 30 patch 0 Not Taking at Unknown time  . lidocaine-prilocaine (EMLA) cream Apply 1 application every Monday, Wednesday, and Friday topically. At dialysis  3 PRN at PRN  . metoprolol tartrate (LOPRESSOR) 25 MG tablet Take 1 tablet (25 mg total) by mouth 2 (two) times daily. 60 tablet 1   . multivitamin (RENA-VIT) TABS tablet TAKE 1 TABLET BY MOUTH EVERYDAY AT BEDTIME (Patient taking differently: No sig reported) 30 tablet 0 Unknown at Unknown  . rOPINIRole (REQUIP) 2 MG tablet Take 1 tablet (2 mg total) by mouth daily. 30 tablet 3   . temazepam (RESTORIL) 15 MG capsule Take 1 capsule (15 mg total) by mouth at bedtime as  needed for sleep. 30 capsule 0 PRN at PRN  . timolol (TIMOPTIC) 0.5 % ophthalmic solution Place 1 drop into both eyes 2 (two) times daily. 10 mL 1 Unknown at Unknown    Musculoskeletal: Strength & Muscle Tone: decreased Gait & Station: unable to stand Patient leans: N/A  Psychiatric Specialty Exam: I reviewed physiac exam performed on medical floor and agree with the findings.  Physical Exam  Nursing note and  vitals reviewed. Psychiatric: Her speech is normal. Her mood appears anxious. Her affect is labile. She is withdrawn and actively hallucinating. Thought content is paranoid and delusional. Cognition and memory are impaired. She expresses impulsivity. She exhibits a depressed mood. She expresses suicidal ideation.    Review of Systems  Musculoskeletal: Positive for myalgias.  Skin:       Multiple bruises from a fall  Neurological: Negative.   Psychiatric/Behavioral: Positive for depression, hallucinations, memory loss and suicidal ideas. The patient is nervous/anxious and has insomnia.   All other systems reviewed and are negative.   Blood pressure (!) 97/56, pulse 88, temperature 98 F (36.7 C), temperature source Oral, resp. rate 16, height 5\' 3"  (1.6 m), weight 67 kg, SpO2 98 %.Body mass index is 26.17 kg/m.  See SRA                                                  Sleep:  Number of Hours: 1    Treatment Plan Summary: Daily contact with patient to assess and evaluate symptoms and progress in treatment and Medication management   Ms. Chretien is a 72 year old female with a history of bipolar disorder, recently discharged from psychiatry, who was readmitted immediately after discharge after  Intentional overdose on medications when facing real or imaginary financial burden of dialysis. She was intubated and admitted to ICU. She returns to psychiatry unable to ambulate, swallow and, psychtoic with paranoid delusions and hallucinations.    #Mood/psychosis -continue Tegratol 200 mg BID -Haldol 2.5 mg before dialysis on M,W,Fri -Geodon 40 mg BID  #HTN -Metoprolol 25 mg BID  #Dialysis -nephrology input is appreciated  #Deconditioning -unable to stand or ambulate -PT  -SNF recommended after discharge  #Dysphagia -mechanical soft diet with thin liquids -speech pathology eval  #GERD -Pepcid 20 mg  #Restless legs -Requip 2 mg nightly  #Glaucoma -continue eye drops  #Disposition -patient will eventually return to live with her son -SNF referral if necessary -follow up with [facility]  Observation Level/Precautions:  1 to 1  Laboratory:  CBC Chemistry Profile  Psychotherapy:    Medications:    Consultations:    Discharge Concerns:    Estimated LOS:  Other:     Physician Treatment Plan for Primary Diagnosis: Bipolar I disorder, current or most recent episode depressed, with psychotic features ([place]) Long Term Goal(s): Improvement in symptoms so as ready for discharge  Short Term Goals: Ability to identify changes in lifestyle to reduce recurrence of condition will improve, Ability to verbalize feelings will improve, Ability to disclose and discuss suicidal ideas, Ability to demonstrate self-control will improve, Ability to identify and develop effective coping behaviors will improve, Ability to maintain clinical measurements within normal limits will improve, Compliance with prescribed medications will improve and Ability to identify triggers associated with substance abuse/mental health issues will improve  Physician Treatment Plan for Secondary Diagnosis: Principal Problem:   Bipolar I disorder, current or most recent episode depressed, with psychotic features ([place]) Active Problems:   HTN (hypertension)   ESRD on dialysis ([place])   Suicide attempt ([place])  Long Term Goal(s): NA  Short Term Goals: NA  I certify that inpatient services furnished can reasonably be expected to improve the patient's condition.     Orson Slick, MD 8/22/201911:28 AM

## 2018-03-11 NOTE — Plan of Care (Signed)
Patient is alert to self and place, disoriented to situation and time. Patient denies SI, HI and AVH. Patient has disorganized thinking, seems to be focused on debt and paying hospital bills. This morning patient states she did not want to take medications but after receiving further education about medication and dialysis patient agreed to treatment. Patient complains of confusion and helplessness, eye contact is fair; facial expression is sad and flat, mood is depressed and anxious. Patient's gait is unstable, and is total care therefore has a sitter at all times. Patient ate breakfast this morning, rates pain 0/10. No self injurious behaviors noted. There is no distress noted, CNA present in room within arms reach of patient. Problem: Education: Goal: Knowledge of [facility] Education information/materials will improve Outcome: Not Progressing Goal: Emotional status will improve Outcome: Not Progressing Goal: Mental status will improve Outcome: Not Progressing Goal: Verbalization of understanding the information provided will improve Outcome: Not Progressing   Problem: Education: Goal: Utilization of techniques to improve thought processes will improve Outcome: Not Progressing Goal: Knowledge of the prescribed therapeutic regimen will improve Outcome: Not Progressing   Nurse will continue to monitor.

## 2018-03-11 NOTE — BHH Suicide Risk Assessment (Signed)
[facility] Admission Suicide Risk Assessment   Nursing information obtained from:  Patient Demographic factors:  Caucasian, Age 72 or older, Living alone, Divorced or widowed Current Mental Status:  NA Loss Factors:  Decline in physical health Historical Factors:  NA Risk Reduction Factors:  NA  Total Time spent with patient: 1 hour Principal Problem: Bipolar I disorder, current or most recent episode depressed, with psychotic features ([place]) Diagnosis:   Patient Active Problem List   Diagnosis Date Noted  . Bipolar I disorder, current or most recent episode depressed, with psychotic features ([place]) [F31.5] 02/26/2018    Priority: High  . Bipolar affective disorder, current episode depressed with psychotic symptoms ([place]) [F31.2] 09/17/2017    Priority: High  . Bipolar I disorder, most recent episode (or current) manic ([place]) [F31.10] 09/02/2017    Priority: High  . Suicide attempt ([place]) [T14.91XA] 03/09/2018  . Acute respiratory failure with hypoxia ([place]) [J96.01] 03/04/2018  . UTI (urinary tract infection) [N39.0] 12/16/2017  . Palliative care encounter [Z51.5]   . Acute delirium [R41.0] 09/02/2017  . Altered mental status [R41.82]   . Acute encephalopathy [G93.40] 09/01/2017  . ESRD on dialysis ([place]) [N18.6, Z99.2] 04/06/2017  . Complication of vascular access for dialysis [T82.9XXA] 04/06/2017  . Hematuria [R31.9] 12/18/2016  . Polycystic kidney [Q61.3] 10/12/2016  . Ruptured cyst of kidney [Q61.00] 10/03/2016  . HTN (hypertension) [I10] 10/01/2016  . Chronic kidney disease [N18.9] 10/01/2016  . Anemia associated with chronic renal failure [N18.9, D63.1] 09/24/2016  . Acute kidney insufficiency [N28.9] 07/28/2016  . Noninfectious diarrhea [K52.9]   . Benign neoplasm of cecum [D12.0]   . Benign neoplasm of ascending colon [D12.2]   . Diarrhea [R19.7]   . Nausea [R11.0]   . Gastric polyp [K31.7]   . Chronic constipation [K59.09] 12/27/2014  . Nausea with vomiting [R11.2] 12/27/2014   . Left sided abdominal pain [R10.9] 12/27/2014  . Personal history of colonic polyps [Z86.010] 05/10/2013   Subjective Data: suicide attempt  Continued Clinical Symptoms:  Alcohol Use Disorder Identification Test Final Score (AUDIT): 0 The "Alcohol Use Disorders Identification Test", Guidelines for Use in Primary Care, Second Edition.  World Pharmacologist [facility]). Score between 0-7:  no or low risk or alcohol related problems. Score between 8-15:  moderate risk of alcohol related problems. Score between 16-19:  high risk of alcohol related problems. Score 20 or above:  warrants further diagnostic evaluation for alcohol dependence and treatment.   CLINICAL FACTORS:   Bipolar Disorder:   Depressive phase Depression:   Delusional Hopelessness Impulsivity Insomnia Currently Psychotic   Musculoskeletal: Strength & Muscle Tone: abnormal and decreased Gait & Station: unable to stand Patient leans: N/A  Psychiatric Specialty Exam: Physical Exam  Nursing note and vitals reviewed. Psychiatric: Her speech is normal. Her mood appears anxious. Her affect is labile. She is withdrawn. Thought content is paranoid and delusional. Cognition and memory are impaired. She expresses impulsivity.    Review of Systems  Musculoskeletal: Positive for myalgias.  Neurological: Negative.   Psychiatric/Behavioral: Positive for depression, hallucinations, memory loss and suicidal ideas. The patient is nervous/anxious and has insomnia.   All other systems reviewed and are negative.   Blood pressure (!) 143/63, pulse (!) 108, temperature 98.3 F (36.8 C), temperature source Oral, resp. rate 16, height 5\' 3"  (1.6 m), weight 67 kg, SpO2 98 %.Body mass index is 26.17 kg/m.  General Appearance: Fairly Groomed  Eye Contact:  Good  Speech:  Clear and Coherent  Volume:  Decreased  Mood:  Anxious, Depressed and Hopeless  Affect:  Tearful  Thought Process:  Disorganized and Descriptions of Associations:  Tangential  Orientation:  Other:  to person and place  Thought Content:  Illogical, Delusions, Hallucinations: Auditory and Paranoid Ideation  Suicidal Thoughts:  Yes.  with intent/plan  Homicidal Thoughts:  No  Memory:  Immediate;   Poor Recent;   Poor Remote;   Poor  Judgement:  Poor  Insight:  Lacking  Psychomotor Activity:  Psychomotor Retardation  Concentration:  Concentration: Poor and Attention Span: Poor  Recall:  Poor  Fund of Knowledge:  Fair  Language:  Fair  Akathisia:  No  Handed:  Right  AIMS (if indicated):     Assets:  Communication Skills Desire for Improvement Financial Resources/Insurance Housing Resilience Social Support  ADL's:  Intact  Cognition:  WNL  Sleep:  Number of Hours: 1      COGNITIVE FEATURES THAT CONTRIBUTE TO RISK:  None    SUICIDE RISK:   Extreme:  Frequent, intense, and enduring suicidal ideation, specific plans, clear subjective and objective intent, impaired self-control, severe dysphoria/symptomatology, many risk factors and no protective factors.  PLAN OF CARE: hospital admission, medication management, dialysis, discharge planning.  Ms. Mcmasters is a 72 year old female with a history of bipolar disorder, recently discharged from psychiatry, who was readmitted immediately after discharge after  Intentional overdose on medications when facing real or imaginary financial burden of dialysis. She was intubated and admitted to ICU. She returns to psychiatry unable to ambulate, swallow and, psychtoic with paranoid delusions and hallucinations.   #Mood/psychosis -continue Tegratol 200 mg BID -Haldol 2.5 mg before dialysis on M,W,Fri -Geodon 40 mg BID  #HTN -Metoprolol 25 mg BID  #Dialysis -nephrology input is appreciated  #Deconditioning -unable to stand or ambulate -PT  -SNF recommended after discharge  #Dysphagia -mechanical soft diet with thin liquids -speech pathology eval  #GERD -Pepcid 20 mg  #Restless legs -Requip 2  mg nightly  #Glaucoma -continue eye drops  #Disposition -patient will eventually return to live with her son -SNF referral if necessary -follow up with [facility]  I certify that inpatient services furnished can reasonably be expected to improve the patient's condition.   Orson Slick, MD 03/11/2018, 11:15 AM

## 2018-03-11 NOTE — Progress Notes (Signed)
Patient ID: Joyce [name], female   DOB: 27-Apr-1946, 72 y.o.   MRN: [id number] Patient presents involuntarily after being found unresponsive at her house. Was recently discharged from this hospital to her son's house. Was unable to stay there then decided to go to her own home. Patient reports that she took a lot of pills and lost conscience. Patient presents with multiple medical problems including renal problems and is on dialysis. Presents with a recent fall and generalized weakness. Unable to ambulate independently. Placed on 1:1 observations for safety. Skin assessment performed by this Probation officer, staffed with nurse Ubaldo Glassing: a large bruise found on the right thigh. Patient reports that it is related to the recent fall. Dialysis shunt assessed, bruit present and active. Psychiatric evaluation to be performed by MD in AM. Currently in room with sitter: meal was offered and patient ate about 70%. Safety precautions initiated.

## 2018-03-11 NOTE — Progress Notes (Signed)
Recreation Therapy Notes  Date: 03/11/2018  Time: 9:30 am   Location: Craft Room   Behavioral response: N/A   Intervention Topic: Happiness  Discussion/Intervention: Patient did not attend group.   Clinical Observations/Feedback:  Patient did not attend group.   Jaxxon Naeem LRT/CTRS        Ashtan Laton 03/11/2018 11:05 AM

## 2018-03-11 NOTE — NC FL2 (Signed)
[place] LEVEL OF CARE SCREENING TOOL     IDENTIFICATION  Patient Name: Joyce [name] Birthdate: Jul 15, 1946 Sex: female Admission Date (Current Location): 03/11/2018  [place] and [place] Number:  Lemmie Evens Medicare- [id number] Facility and Address:  [facility], [address] [place], [place] [address]      Provider Number: [id number]  Attending Physician Name and Address:  Clovis Fredrickson, MD  Relative Name and Phone Number:  Merlene Laughter- [phone]    Current Level of Care: Hospital Recommended Level of Care: [place] Prior Approval Number:    Date Approved/Denied:   PASRR Number:    Discharge Plan: SNF    Current Diagnoses: Patient Active Problem List   Diagnosis Date Noted  . Suicide attempt ([place]) 03/09/2018  . Acute respiratory failure with hypoxia ([place]) 03/04/2018  . Bipolar I disorder, current or most recent episode depressed, with psychotic features ([place]) 02/26/2018  . UTI (urinary tract infection) 12/16/2017  . Palliative care encounter   . Bipolar affective disorder, current episode depressed with psychotic symptoms ([place]) 09/17/2017  . Bipolar I disorder, most recent episode (or current) manic ([place]) 09/02/2017  . Acute delirium 09/02/2017  . Altered mental status   . Acute encephalopathy 09/01/2017  . ESRD on dialysis ([place]) 04/06/2017  . Complication of vascular access for dialysis 04/06/2017  . Hematuria 12/18/2016  . Polycystic kidney 10/12/2016  . Ruptured cyst of kidney 10/03/2016  . HTN (hypertension) 10/01/2016  . Chronic kidney disease 10/01/2016  . Anemia associated with chronic renal failure 09/24/2016  . Acute kidney insufficiency 07/28/2016  . Noninfectious diarrhea   . Benign neoplasm of cecum   . Benign neoplasm of ascending colon   . Diarrhea   . Nausea   . Gastric polyp   . Chronic constipation 12/27/2014  . Nausea with vomiting 12/27/2014  . Left sided  abdominal pain 12/27/2014  . Personal history of colonic polyps 05/10/2013    Orientation RESPIRATION BLADDER Height & Weight     Self, Situation, Place  Normal Incontinent(Anuria) Weight: 147 lb 11.3 oz (67 kg) Height:  5\' 3"  (160 cm)  BEHAVIORAL SYMPTOMS/MOOD NEUROLOGICAL BOWEL NUTRITION STATUS  (None) (N/A) Incontinent Diet(Soft Diet)  AMBULATORY STATUS COMMUNICATION OF NEEDS Skin   Total Care Verbally Normal                       Personal Care Assistance Level of Assistance  Bathing, Feeding, Dressing, Total care Bathing Assistance: Maximum assistance Feeding assistance: Limited assistance Dressing Assistance: Maximum assistance Total Care Assistance: Maximum assistance   Functional Limitations Info  Sight, Hearing, Speech Sight Info: Adequate Hearing Info: Adequate Speech Info: Adequate    SPECIAL CARE FACTORS FREQUENCY  (Dialysis on Mondays, Wednesdays and Fridays)                    Contractures Contractures Info: Not present    Additional Factors Info  Psychotropic, Allergies, Code Status Code Status Info: Full Code Allergies Info: Indomethacin, Pollen Extract, Morphine And Related Psychotropic Info: Psycotropic Medications         Current Medications (03/11/2018):  This is the current hospital active medication list Current Facility-Administered Medications  Medication Dose Route Frequency Provider Last Rate Last Dose  . acetaminophen (TYLENOL) tablet 650 mg  650 mg Oral Q6H PRN Clapacs, [name], MD      . alum & mag hydroxide-simeth (MAALOX/MYLANTA) 200-200-20 MG/5ML suspension 30 mL  30 mL Oral Q4H PRN Clapacs,  [name], MD      . brimonidine (ALPHAGAN) 0.2 % ophthalmic solution 1 drop  1 drop Both Eyes BID Clapacs, [name], MD      . carbamazepine (TEGRETOL) tablet 200 mg  200 mg Oral TID Clapacs, [name], MD   200 mg at 03/11/18 1237  . dicyclomine (BENTYL) tablet 20 mg  20 mg Oral TID AC Clapacs, [name], MD   20 mg at 03/11/18 1237  . famotidine  (PEPCID) tablet 20 mg  20 mg Oral Daily Clapacs, [name], MD   20 mg at 03/11/18 5638  . feeding supplement (NEPRO CARB STEADY) liquid 237 mL  237 mL Oral BID BM Clapacs, [name], MD   237 mL at 03/11/18 1237  . [START ON 03/12/2018] haloperidol (HALDOL) tablet 2.5 mg  2.5 mg Oral Q M,W,F Clapacs, [name], MD      . magnesium hydroxide (MILK OF MAGNESIA) suspension 30 mL  30 mL Oral Daily PRN Clapacs, [name], MD      . metoprolol tartrate (LOPRESSOR) tablet 25 mg  25 mg Oral BID Clapacs, [name], MD   25 mg at 03/11/18 7564  . multivitamin (RENA-VIT) tablet 1 tablet  1 tablet Oral QHS [name], [name], MD      . pentafluoroprop-tetrafluoroeth (GEBAUERS) aerosol   Topical PRN Clapacs, [name], MD      . rOPINIRole (REQUIP) tablet 2 mg  2 mg Oral Daily Clapacs, [name], MD   2 mg at 03/11/18 [phone]  . timolol (TIMOPTIC) 0.5 % ophthalmic solution 1 drop  1 drop Both Eyes BID Clapacs, [name], MD      . vitamin C (ASCORBIC ACID) tablet 250 mg  250 mg Oral BID [name], [name], MD   Stopped at 03/11/18 1238  . ziprasidone (GEODON) capsule 40 mg  40 mg Oral BID WC [name], [name], MD         Discharge Medications: Please see discharge summary for a list of discharge medications.  Relevant Imaging Results:  Relevant Lab Results:   Additional Information [facility]- [id number], Dialysis on Mondays, Wednesdays and Fridays  Darin Engels, [place]

## 2018-03-11 NOTE — Progress Notes (Signed)
Post HD assessment. Pt tolerated tx well without c/o. Pt became tachycardic midway through tx, stable, no c/o. Pt's venous access pressures continued to increase throughout HD tx, BFR needed to be decreased in order to continue tx, possible stenosis. Machine malfunctioned with 52 minutes remaining in tx, system clotted, pt unable to be rinsed back, unable to obtain BVP. Pt experienced increased bleeding in both venous and arterial access sites post HD tx. Net UF 1270, goal not met, MD aware.    03/11/18 0052  Vital Signs  Temp 98 F (36.7 C)  Temp Source Oral  Pulse Rate (!) 110  Pulse Rate Source Monitor  Resp (!) 21  BP 105/61  BP Location Right Arm  BP Method Automatic  Patient Position (if appropriate) Lying  Oxygen Therapy  SpO2 99 %  O2 Device Room Air  Dialysis Weight  Weight 67 kg  Type of Weight Post-Dialysis  Post-Hemodialysis Assessment  Rinseback Volume (mL) 0 mL  KECN  (machine malfunction, unable to obtain BVP, MD aware)  Dialyzer Clearance Heavily streaked  Duration of HD Treatment -hour(s) 2.9 hour(s)  Hemodialysis Intake (mL) 250 mL  UF Total -Machine (mL) 1520 mL  Net UF (mL) 1270 mL  Tolerated HD Treatment Yes  AVG/AVF Arterial Site Held (minutes) 20 minutes (increased bleeding,MD aware)  AVG/AVF Venous Site Held (minutes) 20 minutes (increased bleeding,MD aware)  Fistula / Graft Left Upper arm Arteriovenous fistula  Placement Date: 08/13/15   Placed prior to admission: Yes  Orientation: Left  Access Location: Upper arm  Access Type: Arteriovenous fistula  Site Condition No complications  Fistula / Graft Assessment Present;[place];Bruit  Status Deaccessed  Drainage Description None

## 2018-03-11 NOTE — Tx Team (Signed)
Initial Treatment Plan 03/11/2018 4:46 AM [name] HAW:[id number]    PATIENT STRESSORS: Health problems Loss of spouse   PATIENT STRENGTHS: Financial means Supportive family/friends   PATIENT IDENTIFIED PROBLEMS: Depression   Suicidal thoughts  Disturbed thought process                 DISCHARGE CRITERIA:  Ability to meet basic life and health needs Adequate post-discharge living arrangements Improved stabilization in mood, thinking, and/or behavior Medical problems require only outpatient monitoring Motivation to continue treatment in a less acute level of care  PRELIMINARY DISCHARGE PLAN: Attend aftercare/continuing care group Outpatient therapy Participate in family therapy Placement in alternative living arrangements  PATIENT/FAMILY INVOLVEMENT: This treatment plan has been presented to and reviewed with the patient, Joyce [name].  The patient has been given the opportunity to ask questions and make suggestions.  Ronelle Nigh, RN 03/11/2018, 4:46 AM

## 2018-03-11 NOTE — Progress Notes (Signed)
Post HD assessment    03/11/18 0051  Neurological  Level of Consciousness Alert  Orientation Level Oriented to person;Oriented to situation  Respiratory  Respiratory Pattern Regular;Unlabored  Cardiac  ECG Monitor Yes  Cardiac Rhythm ST  Vascular  R Radial Pulse +2  L Radial Pulse +2  Edema Generalized  Integumentary  Integumentary (WDL) X  Skin Color Appropriate for ethnicity  Musculoskeletal  Musculoskeletal (WDL) X  Generalized Weakness Yes  Assistive Device None  GU Assessment  Genitourinary (WDL) X  Genitourinary Symptoms  (HD)  Psychosocial  Psychosocial (WDL) X  Patient Behaviors Anxious;Cooperative

## 2018-03-11 NOTE — Progress Notes (Signed)
Patient currently sleeping, sitter at bedside. No sign of distress. Safety precautions maintained.

## 2018-03-11 NOTE — BHH Counselor (Signed)
Adult Comprehensive Assessment  Patient ID: Joyce [name], female   DOB: 1946/01/23, 72 y.o.   MRN: [id number]  nformation Source: Information source: Patient (Also review of PSA completed on 02/27/2018)   Current Stressors:  Patient states their primary concerns and needs for treatment are: "my bills for dialysis are ranking up, I can't afford it. I wanted to end it." Patient states their goals for this hospitalization and ongoing recovery are: "to be able to go home to my trailer and start a life with my son." Educational / Learning stressors: None noted Employment / Job issues: Pt does not currently work.  She stopped working in January 2019. Family Relationships: Pt is very close with her son, daughter-in-law, sister, grandchildren Museum/gallery curator / Lack of resources (include bankruptcy): Pt shared that although she receives social security benefits, it is not enough to pay all her bills like the hospital bills that were incurred in March 2019 and this current hospitalization. Housing / Lack of housing: Housing is stable Physical health (include injuries & life threatening diseases): Pt has ESRD (Stage 5).  She currently received dialysis 3xweek Social relationships: Pt shared that her social relationships are limited and mostly includes her family Substance abuse: None noted Bereavement / Loss: No current bereavement issues.     Living/Environment/Situation:  Living Arrangements: Children(Pt was living at home. Previously to that she was living in the home of her son and daughter-in-law) Living conditions (as described by patient or guardian): "It is good.  My son and daughter-in-law take really good care of me". Who else lives in the home?: No one How long has patient lived in current situation?: since last discharge mid-August 2019. What is atmosphere in current home: Comfortable, Loving, Supportive   Family History:  Marital status: Divorced Divorced, when?: 1982 What types of issues  is patient dealing with in the relationship?: None Additional relationship information: None Are you sexually active?: No What is your sexual orientation?: Heterosexual Has your sexual activity been affected by drugs, alcohol, medication, or emotional stress?: No Does patient have children?: Yes How many children?: 1 How is patient's relationship with their children?: Pt is very close with her only son.  She is currently living with him and his wife.     Childhood History:  By whom was/is the patient raised?: Both parents Additional childhood history information: None Description of patient's relationship with caregiver when they were a child: "fanastic" Patient's description of current relationship with people who raised him/her: Pt's mother is 35 yo and her father is deceased How were you disciplined when you got in trouble as a child/adolescent?: "a loving correction" Does patient have siblings?: Yes Number of Siblings: 4 Description of patient's current relationship with siblings: 3 brothers are deceased.  She was close to all her brothers.  She has 1 sister whom she has a good relationship with.  Her sister is her Press photographer Did patient suffer any verbal/emotional/physical/sexual abuse as a child?: No Did patient suffer from severe childhood neglect?: No Has patient ever been sexually abused/assaulted/raped as an adolescent or adult?: No Was the patient ever a victim of a crime or a disaster?: No Witnessed domestic violence?: No Has patient been effected by domestic violence as an adult?: No   Education:  Highest grade of school patient has completed: 28 (some college) Currently a Ship broker?: No Learning disability?: No   Employment/Work Situation:   Employment situation: Retired Archivist job has been impacted by current illness: No What is the longest  time patient has a held a job?: 30 years Where was the patient employed at that time?: [employer] Did You Receive Any Psychiatric Treatment/Services While in the [employer]?: No Are There Guns or Other Weapons in [place]?: No Are These Psychologist, educational?: (No reported weapons in the home.)   Financial Resources:   Financial resources: Medicare(Pt receives Fish farm manager and retirement benefits) Does patient have a Programmer, applications or guardian?: No   Alcohol/Substance Abuse:   What has been your use of drugs/alcohol within the last 12 months?: None If attempted suicide, did drugs/alcohol play a role in this?: No Alcohol/Substance Abuse Treatment Hx: Denies past history If yes, describe treatment: n/a Has alcohol/substance abuse ever caused legal problems?: No   Social Support System:   Pensions consultant Support System: Good Describe Community Support System: Pt's primary community system are family members to include her sister and cousin Type of faith/religion: Baptist How does patient's faith help to cope with current illness?: "pray and go to church"   Leisure/Recreation:   Leisure and Hobbies: singing, reading, spending time with grandchildren (have 3 grands and 1 great-grand)   Strengths/Needs:   What is the patient's perception of their strengths?: "cleaning the house, keeping things organized" Patient states they can use these personal strengths during their treatment to contribute to their recovery: "to stay busy and active" Patient states these barriers may affect/interfere with their treatment: "having all these UTIs which often makes me crazy" Patient states these barriers may affect their return to the community: "Not now as everything has cleared up" Other important information patient would like considered in planning for their treatment: None noted   Discharge Plan:   Currently receiving community mental health services: Yes (From Whom)(Dr. Lew Dawes, MD) Patient states concerns and preferences for aftercare planning are: "Continue  working with Dr. Thurmond Butts.  Continues are my medicines may stop working and I will be right back here" Patient states they will know when they are safe and ready for discharge when: "when I feel better because I want to be more independent" Does patient have access to transportation?: Yes Does patient have financial barriers related to discharge medications?: No Patient description of barriers related to discharge medications: None noted Will patient be returning to same living situation after discharge?: No, pt states that she wants to go back to her own place    Summary/Recommendations:   Summary and Recommendations (to be completed by the evaluator): Patient is a 72 year old female admitted involuntarily after a suicide attempt by overdose on multiple medications. Patient was recently diagnosed with dementia. The patient reports having stressors associated with her bills, dialysis in particular. She was last discharged from [facility] on March 03, 2018 to her family. Patients family then took her home to live on her own after she demanded to go home. Patient does not report any alcohol or substance abuse. During the assessment the patient was confused some asking if she was at the hospital. At discharge, patient wants to return home to her family and attend outpatient treatment. While here, patient will benefit from crisis stabilization, medication evaluation, group therapy and psychoeducation, in addition to case management for discharge planning. At discharge, it is recommended that patient remain compliant with the established discharge plan and continue treatment.   Darin Engels. 03/11/2018

## 2018-03-11 NOTE — Progress Notes (Signed)
8AM: Patient in room; sitting up in bed eating breakfast; introductions made and medication given after education given to patient. No distress noted; CNA within arms reach.  9AM: Patient laying down in bed awake, no distress noted; CNA within arms reach.  10AM: Patient in room laying down in bed; eyes closed; respirations even and unlabored breathing; CNA within arms reach.  11AM, Patient in room, laying down in bed eyes closed, respirations even and unlabored breathing, MHT within arms reach, no distress.  12PM: Patient sitting up in bed eating lunch; patient received noon medication; no distress noted; CNA within arms reach.  1PM Patient laying down in bed; eyes open; talking with CNA; no distress noted.  2PM Patient laying down in bed; eyes closed; respirations even and unlabored, Nurse within arms reach.  3PM: Patient laying down in bed eyes closed; respirations even and unlabored; CNA with in arms reach.  4PM: Patient laying down in bed eyes closed,respirations even and unlabored, CNA within arms reach.  5PM : Patient sitting up in bed eating dinner; medications given; Patient rates pain 0/10; no needs at this time; CNA within arms reach.  6PM; Patient laying in bed; eyes closed respirations even and unlabored, CNA within arms reach.

## 2018-03-11 NOTE — BHH Group Notes (Signed)
[facility] Group Notes:  (Nursing/MHT/Case Management/Adjunct)  Date:  03/11/2018  Time:  10:31 PM  Type of Therapy:  Group Therapy  Participation Level:  Did Not Attend    Barnie Mort 03/11/2018, 10:31 PM

## 2018-03-11 NOTE — Progress Notes (Addendum)
[facility] MD Progress Note  03/12/2018 9:23 AM Joyce [name]  MRN:  [id number]  Subjective:    Joyce [name] is in much better spirit today. She slept through the night. Took her medications this morning complaining that there are "too many" of them. Actually, she is getting Tegretol, Bentyl, Metoprolol, Vit C and Geodon in the morning. We started Geodon yesterday and she reports no problems so far. She ate breakfast and worked with PT. She was able to ambulate to the bathroom this morning but did feel dizzy. Sitter at arm's length. She will receive dialysis today.   Joyce denies any symptoms of depression, anxiety or psychosis. She is not suicidal or homicidal. She did not appear psychotic this morning at all. She is thrilled to be able to return to live with her son and daughter in law and wants to be in best possible shape upon return home.   On arrival to the unit, due to physical disability, skilled nursing facility rehab was recommended. We are in the process of obtaining PASSAR. FL2 completed and PPD placed.    Principal Problem: Bipolar I disorder, current or most recent episode depressed, with psychotic features [facility]) Diagnosis:   Patient Active Problem List   Diagnosis Date Noted  . Bipolar I disorder, current or most recent episode depressed, with psychotic features ([place]) [F31.5] 02/26/2018    Priority: High  . Bipolar affective disorder, current episode depressed with psychotic symptoms ([place]) [F31.2] 09/17/2017    Priority: High  . Bipolar I disorder, most recent episode (or current) manic ([place]) [F31.10] 09/02/2017    Priority: High  . Suicide attempt ([place]) [T14.91XA] 03/09/2018  . Acute respiratory failure with hypoxia ([place]) [J96.01] 03/04/2018  . UTI (urinary tract infection) [N39.0] 12/16/2017  . Palliative care encounter [Z51.5]   . Acute delirium [R41.0] 09/02/2017  . Altered mental status [R41.82]   . Acute encephalopathy [G93.40] 09/01/2017  . ESRD on dialysis ([place]) [N18.6,  Z99.2] 04/06/2017  . Complication of vascular access for dialysis [T82.9XXA] 04/06/2017  . Hematuria [R31.9] 12/18/2016  . Polycystic kidney [Q61.3] 10/12/2016  . Ruptured cyst of kidney [Q61.00] 10/03/2016  . HTN (hypertension) [I10] 10/01/2016  . Chronic kidney disease [N18.9] 10/01/2016  . Anemia associated with chronic renal failure [N18.9, D63.1] 09/24/2016  . Acute kidney insufficiency [N28.9] 07/28/2016  . Noninfectious diarrhea [K52.9]   . Benign neoplasm of cecum [D12.0]   . Benign neoplasm of ascending colon [D12.2]   . Diarrhea [R19.7]   . Nausea [R11.0]   . Gastric polyp [K31.7]   . Chronic constipation [K59.09] 12/27/2014  . Nausea with vomiting [R11.2] 12/27/2014  . Left sided abdominal pain [R10.9] 12/27/2014  . Personal history of colonic polyps [Z86.010] 05/10/2013   Total Time spent with patient: 20 minutes  Past Psychiatric History: bipolar disorder  Past Medical History:  Past Medical History:  Diagnosis Date  . Anal fissure   . Bipolar affective disorder ([place])   . CKD (chronic kidney disease)    Dr Holley Raring [name] 4  . Colon polyps   . Diverticulitis   . Diverticulitis   . Family history of adverse reaction to anesthesia    mom - PONV  . GERD (gastroesophageal reflux disease)   . Headache    migraines - none over 10 yrs  . Heart murmur   . History of hiatal hernia   . Hypertension   . Pancreatitis    Valproic acid  . Vertigo     Past Surgical History:  Procedure Laterality  Date  . A/V FISTULAGRAM Left 04/14/2017   Procedure: A/V Fistulagram;  Surgeon: Katha Cabal, MD;  Location: [place] [facility];  Service: Cardiovascular;  Laterality: Left;  . A/V FISTULAGRAM Left 06/09/2017   Procedure: A/V FISTULAGRAM;  Surgeon: Katha Cabal, MD;  Location: [place] [facility];  Service: Cardiovascular;  Laterality: Left;  . A/V FISTULAGRAM Left 11/02/2017   Procedure: A/V FISTULAGRAM;  Surgeon: Algernon Huxley, MD;  Location: [place] [facility];  Service: Cardiovascular;  Laterality: Left;  . A/V SHUNT INTERVENTION N/A 11/02/2017   Procedure: A/V SHUNT INTERVENTION;  Surgeon: Algernon Huxley, MD;  Location: [place] [facility];  Service: Cardiovascular;  Laterality: N/A;  . ABDOMINAL HYSTERECTOMY  1990 ?  . AV FISTULA PLACEMENT  4/30  . BREAST EXCISIONAL BIOPSY Left 1994   neg surgical bx  . CHOLECYSTECTOMY  2003  . COLONOSCOPY  2014   Dr. Jamal Collin  . COLONOSCOPY WITH PROPOFOL N/A 09/24/2015   Procedure: COLONOSCOPY WITH random colon byopies.;  Surgeon: Lucilla Lame, MD;  Location: [facility];  Service: Endoscopy;  Laterality: N/A;  . ESOPHAGOGASTRODUODENOSCOPY (EGD) WITH PROPOFOL N/A 09/24/2015   Procedure: ESOPHAGOGASTRODUODENOSCOPY (EGD) ;  Surgeon: Lucilla Lame, MD;  Location: [place];  Service: Endoscopy;  Laterality: N/A;  . EYE SURGERY    . PERIPHERAL VASCULAR CATHETERIZATION N/A 05/29/2015   Procedure: A/V Shuntogram/Fistulagram;  Surgeon: Katha Cabal, MD;  Location: [place] [facility];  Service: Cardiovascular;  Laterality: N/A;  . PERIPHERAL VASCULAR CATHETERIZATION N/A 05/29/2015   Procedure: A/V Shunt Intervention;  Surgeon: Katha Cabal, MD;  Location: [place] [facility];  Service: Cardiovascular;  Laterality: N/A;  . POLYPECTOMY  09/24/2015   Procedure: POLYPECTOMY INTESTINAL;  Surgeon: Lucilla Lame, MD;  Location: [place];  Service: Endoscopy;;  cecal polyp ascending polyp   Family History:  Family History  Problem Relation Age of Onset  . Stroke Father   . Hypertension Father   . Breast cancer Other   . Colon cancer Neg Hx   . Liver disease Neg Hx    Family Psychiatric  History: none Social History:  Social History   Substance and Sexual Activity  Alcohol Use No  . Alcohol/week: 0.0 standard drinks     Social History   Substance and Sexual Activity  Drug Use No    Social History   Socioeconomic History  . Marital status: Single    Spouse name: Not on file   . Number of children: 1  . Years of education: Not on file  . Highest education level: Not on file  Occupational History  . Occupation: Surveyor, quantity: [employer]  Social Needs  . Financial resource strain: Not on file  . Food insecurity:    Worry: Not on file    Inability: Not on file  . Transportation needs:    Medical: Not on file    Non-medical: Not on file  Tobacco Use  . Smoking status: Never Smoker  . Smokeless tobacco: Never Used  Substance and Sexual Activity  . Alcohol use: No    Alcohol/week: 0.0 standard drinks  . Drug use: No  . Sexual activity: Not Currently  Lifestyle  . Physical activity:    Days per week: Not on file    Minutes per session: Not on file  . Stress: Not on file  Relationships  . Social connections:    Talks on phone: Not on file    Gets  together: Not on file    Attends religious service: Not on file    Active member of club or organization: Not on file    Attends meetings of clubs or organizations: Not on file    Relationship status: Not on file  Other Topics Concern  . 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   Additional Social History:                         Sleep: Poor  Appetite:  Poor  Current Medications: Current Facility-Administered Medications  Medication Dose Route Frequency Provider Last Rate Last Dose  . acetaminophen (TYLENOL) tablet 650 mg  650 mg Oral Q6H PRN Clapacs, [name], MD      . alum & mag hydroxide-simeth (MAALOX/MYLANTA) 200-200-20 MG/5ML suspension 30 mL  30 mL Oral Q4H PRN Clapacs, [name], MD      . brimonidine (ALPHAGAN) 0.2 % ophthalmic solution 1 drop  1 drop Both Eyes BID Clapacs, [name], MD   1 drop at 03/12/18 0803  . carbamazepine (TEGRETOL) tablet 200 mg  200 mg Oral TID Clapacs, [name], MD   200 mg at 03/12/18 0805  . dicyclomine (BENTYL) tablet 20 mg  20 mg Oral TID AC Clapacs, [name], MD   20  mg at 03/12/18 0803  . famotidine (PEPCID) tablet 20 mg  20 mg Oral Daily Clapacs, [name], MD   20 mg at 03/12/18 4627  . feeding supplement (NEPRO CARB STEADY) liquid 237 mL  237 mL Oral BID BM Clapacs, [name], MD   237 mL at 03/11/18 1446  . haloperidol (HALDOL) tablet 2.5 mg  2.5 mg Oral Q M,W,F Clapacs, [name], MD      . magnesium hydroxide (MILK OF MAGNESIA) suspension 30 mL  30 mL Oral Daily PRN Clapacs, [name], MD      . metoprolol tartrate (LOPRESSOR) tablet 25 mg  25 mg Oral BID Clapacs, [name], MD   25 mg at 03/12/18 0805  . multivitamin (RENA-VIT) tablet 1 tablet  1 tablet Oral QHS Willene Holian B, MD   1 tablet at 03/11/18 2119  . pentafluoroprop-tetrafluoroeth (GEBAUERS) aerosol   Topical PRN Clapacs, [name], MD      . rOPINIRole (REQUIP) tablet 2 mg  2 mg Oral Daily Clapacs, [name], MD   2 mg at 03/12/18 0803  . timolol (TIMOPTIC) 0.5 % ophthalmic solution 1 drop  1 drop Both Eyes BID Clapacs, [name], MD   1 drop at 03/12/18 0803  . tuberculin injection 5 Units  5 Units Intradermal Once Shadae Reino B, MD   5 Units at 03/11/18 2138  . vitamin C (ASCORBIC ACID) tablet 250 mg  250 mg Oral BID Quinci Gavidia B, MD   250 mg at 03/12/18 0803  . ziprasidone (GEODON) capsule 60 mg  60 mg Oral BID WC Jamyiah Labella B, MD        Lab Results:  No results found for this or any previous visit (from the past 48 hour(s)).  Blood Alcohol level:  Lab Results  Component Value Date   ETH <10 03/04/2018   ETH <10 03/50/0938    Metabolic Disorder Labs: Lab Results  Component Value Date   HGBA1C 4.8 09/18/2017   MPG 91.06 09/18/2017   No results found for: PROLACTIN Lab Results  Component Value  Date   CHOL 251 (H) 09/18/2017   TRIG 124 09/18/2017   HDL 66 09/18/2017   CHOLHDL 3.8 09/18/2017   VLDL 25 09/18/2017   LDLCALC 160 (H) 09/18/2017   LDLCALC 43 05/14/2014    Physical Findings: AIMS:  , ,  ,  ,    CIWA:    COWS:     Musculoskeletal: Strength &  Muscle Tone: decreased Gait & Station: unable to stand Patient leans: N/A  Psychiatric Specialty Exam: Physical Exam  Nursing note and vitals reviewed. Psychiatric: Her speech is normal. Her affect is blunt. She is slowed and withdrawn. Thought content is paranoid and delusional. Cognition and memory are impaired. She expresses impulsivity. She exhibits a depressed mood.    Review of Systems  Neurological: Negative.   Psychiatric/Behavioral: Positive for depression and memory loss. The patient has insomnia.   All other systems reviewed and are negative.   Blood pressure 130/70, pulse 90, temperature 98.4 F (36.9 C), temperature source Oral, resp. rate 20, height 5\' 3"  (1.6 m), weight 67 kg, SpO2 94 %.Body mass index is 26.17 kg/m.  General Appearance: Casual  Eye Contact:  Good  Speech:  Clear and Coherent  Volume:  Normal  Mood:  Anxious  Affect:  Appropriate  Thought Process:  Goal Directed and Descriptions of Associations: Intact  Orientation:  Full (Time, Place, and Person)  Thought Content:  WDL  Suicidal Thoughts:  No  Homicidal Thoughts:  No  Memory:  Immediate;   Fair Recent;   Fair Remote;   Fair  Judgement:  Impaired  Insight:  Lacking and Shallow  Psychomotor Activity:  Decreased  Concentration:  Concentration: Fair and Attention Span: Fair  Recall:  Poor  Fund of Knowledge:  Fair  Language:  Fair  Akathisia:  No  Handed:  Right  AIMS (if indicated):     Assets:  Communication Skills Desire for Improvement Financial Resources/Insurance Housing Resilience Social Support  ADL's:  Impaired  Cognition:  Impaired,  Mild  Sleep:  Number of Hours: 7.75     Treatment Plan Summary: Daily contact with patient to assess and evaluate symptoms and progress in treatment and Medication management   Ms. [name] is a 72 year old female with a history of bipolar disorder, recently discharged from psychiatry, who was readmitted immediately after discharge after   Intentional overdose on medications when facing real or imaginary financial burden of dialysis. She was intubated and admitted to ICU. She returns to psychiatry unable to ambulate, swallow and, psychtoic with paranoid delusions and hallucinations.   #Mood/psychosis, improving -continue Tegretol 200 mg TID -Haldol 2.5 mg before dialysis on M,W,Fri -increase Geodon to 60 mg BID  #HTN -Metoprolol 25 mg BID  #Dialysis -nephrology input is appreciated  #Deconditioning, improving -unable to stand or ambulate -PT  -SNF recommended after discharge  #Dysphagia -mechanical soft diet with thin liquids -speech pathology eval  #GERD -Pepcid 20 mg  #Restless legs -Requip 2 mg nightly  #Glaucoma -continue eye drops  #Disposition -patient will eventually return to live with her son -patient referred to SNF but so far rejected by several facilities -follow up with [facility]  Orson Slick, MD 03/12/2018, 9:23 AM

## 2018-03-11 NOTE — Progress Notes (Addendum)
1:1 sitter hourly note. See Plan of Care note for shift note.  2000- Patient found sleeping upon my arrival. Sitter at bedside. 2100- Patient awake. Woke up approximately 2115. Patient is talking with 1:1.Provided snack of graham crackers and peanut butter with ginger ale. Patient ate snack and is speaking pleasantly. Took vitamins scheduled for HS and allowed PPD to be planted. Sitter at bedside. 2200- Patient toileted and prepared for bed. Resting comfortably at this time. Sitter at bedside. 2300- Patient sleeping. No apparent distress. Sitter at bedside. 0000- Patient sleeping. No apparent distress. Sitter at bedside. 0100- Patient sleeping. No apparent distress. Sitter at bedside. 0200- Patient sleeping. No apparent distress. Sitter at bedside. 0300- Patient sleeping. No apparent distress. Sitter at bedside. 0400-  Patient sleeping. No apparent distress. Sitter at bedside. 0500- Patient sleeping. No apparent distress. Sitter at bedside. 0600- 0700-

## 2018-03-11 NOTE — Progress Notes (Signed)
Patient remains asleep in bed. Safety maintained on 1:1.

## 2018-03-11 NOTE — BHH Counselor (Signed)
CSW completed [facility] referral and FL2 for SNF placement. Waiting on FL2 signature from MD before being able to send FL2 to placement referrals. Additional information needed for the [facility] referral. CSW will send over requested information once FL2 signature is obtained.  Thus far, the patient has been declined by 4 SNF placements. CSW will continue to monitor referrals and send over requested information.   Darin Engels, MSW, Latanya Presser, [place] Clinical Social Worker 03/11/2018 3:56 PM

## 2018-03-11 NOTE — Progress Notes (Signed)
HD tx end    03/11/18 0045  Vital Signs  Pulse Rate (!) 113  Pulse Rate Source Monitor  Resp (!) 21  BP 118/63  BP Location Right Arm  BP Method Automatic  Patient Position (if appropriate) Lying  Oxygen Therapy  SpO2 99 %  O2 Device Room Air  During Hemodialysis Assessment  Intra-Hemodialysis Comments Tx completed (unable to rinse pt back, system clotted,  MD aware. )

## 2018-03-12 MED ORDER — ZIPRASIDONE HCL 20 MG PO CAPS
60.0000 mg | ORAL_CAPSULE | Freq: Two times a day (BID) | ORAL | Status: DC
  Administered 2018-03-12 – 2018-03-19 (×12): 60 mg via ORAL
  Filled 2018-03-12 (×13): qty 3

## 2018-03-12 NOTE — BHH Counselor (Signed)
CW spoke with Sheran Lawless [phone]) with [facility] [place] [facility]. She reports that after reviewing the patients referral they decided to decline her placement due to the extent of her diagnosis.  Darin Engels, MSW, Latanya Presser, [place] Clinical Social Worker 03/12/2018 3:44 PM

## 2018-03-12 NOTE — [facility] Group Notes (Signed)

## 2018-03-12 NOTE — Progress Notes (Signed)
   03/12/18 1050  Clinical Encounter Type  Visited With Patient  Visit Type Follow-up;Spiritual support;Behavioral Health  Referral From Physician;Social work  Consult/Referral To Chaplain  Spiritual Encounters  Spiritual Needs Other (Comment)   [place] set in on patient's Treatment team meeting. Ms. Lyvers stated that her goal while she is in [place] is "to go home but not until I'm ready." [facility] will continue to follow up with patient as needed.

## 2018-03-12 NOTE — Progress Notes (Signed)
1:1 Observations Note  1900 Resting in bedroom with 1:1 present for safety. 2000 Resting in bedroom with 1:1 present for safety. 2100 Resting in bedroom with 1:1 present for safety. 2200 Resting in bedroom with 1:1 present for safety. 2300 Resting in bedroom with eyes closed with 1:1 present for safety. Pt. Chest rise and fall with no distress noted. 0000 Resting in bedroom with eyes closed with 1:1 present for safety. Pt. Chest rise and fall with no distress noted. 0100 Resting in bedroom with eyes closed with 1:1 present for safety. Pt. Chest rise and fall with no distress noted. 0200 Resting in bedroom with eyes closed with 1:1 present for safety. Pt. Chest rise and fall with no distress noted. 0300 Resting in bedroom with eyes closed with 1:1 present for safety. Pt. Chest rise and fall with no distress noted. 0400 Resting in bedroom with eyes closed with 1:1 present for safety. Pt. Chest rise and fall with no distress noted. 0500 Resting in bedroom with eyes closed with 1:1 present for safety. Pt. Chest rise and fall with no distress noted.  0600 Resting in bedroom with eyes closed with 1:1 present for safety. Pt. Chest rise and fall with no distress noted. 0700 Resting in bedroom with 1:1 present for safety.  1:1 maintained per MD orders. Will endorse care to next shift.

## 2018-03-12 NOTE — Progress Notes (Signed)
8am: Patient is alert, sitting up in the bed eating breakfast with sitter. Patient rates pain 0/10; introductions made along with medications given. There is no distress noted.  9am: Patient is sitting in the craft room in treatment with team members; no distress noted.  10am: Patient laying in bed, eyes open; talking with CNA. No distress noted.  11am: Patient laying in bed, eyes closed, respirations even and unlabored; no distress noted, CNA within arms reach.  12pm: Patient is eating lunch in room; CNA within arms reach; no distress noted.    1pm: Patient laying in bed; eyes closed; respirations even; no distress noted.  2pm: Patient is laying in bed; eyes closed, respirations even, no distress noted. CNA present.  3pm: Patient is laying in bed; eyes closed; respirations even,and unlabored, CNA within arms reach.  4pm: Patient is sitting up on the side of the bed talking with CNA, no distress noted.  5pm: Patient eating dinner in room; no distress; CNA present.  6pm: Patient laying in bed; eyes closed; respirations even and unlabored, CNA within reach.

## 2018-03-12 NOTE — BHH Group Notes (Signed)
[facility] Group Notes:  (Nursing/MHT/Case Management/Adjunct)  Date:  03/12/2018  Time:  11:08 PM  Type of Therapy:  Group Therapy  Participation Level:  Did Not Attend    Joyce [name] 03/12/2018, 11:08 PM

## 2018-03-12 NOTE — Evaluation (Signed)
Physical Therapy Evaluation Patient Details Name: Joyce [name] MRN: [id number] DOB: 1945-08-04 Today's Date: 03/12/2018   History of Present Illness  72 y.o. female recently admitted inpt with acute respiratory failure with hypoxia  that was possibly due to drug overdose. Pt was intubated on 8/15, became responsive on 8/18, and was extubated 8/19. Pt had been d/c from psychiatric care just before being addmitted to hospital.   Now is re-evaluated on behavioral med unit for PT and mobility.  PMH includes CKD, bipolar disorder, diverticulitis, hiatal hernia, HTN, and vertigo.   Clinical Impression  Pt was seen for evaluation of mobility and progression of her posture to ck orthostatics.  Noted sitting was 115/53 with pulse 81 and O2 sat 98%;  Standing was 97/43 with pulse 83 and O2 sat 99%.  Informed nursing to allow adjustment of meds or other treatments as needed.  Follow acutely for strength and balance with use of RW and person to assist with all standing for safety.  Planning to transition to SNF when acute stay is done.    Follow Up Recommendations SNF    Equipment Recommendations  None recommended by PT    Recommendations for Other Services       Precautions / Restrictions Precautions Precautions: Fall Precaution Comments: ck BP in standing for orthostasis Restrictions Weight Bearing Restrictions: No      Mobility  Bed Mobility Overal bed mobility: Needs Assistance Bed Mobility: Supine to Sit;Sit to Supine Rolling: Supervision   Supine to sit: Supervision     General bed mobility comments: pt able to return to bed with cues and then tactile cues needed for scooting up with mod assist in Bed.  Transfers Overall transfer level: Needs assistance Equipment used: 1 person hand held assist;Rolling walker (2 wheeled) Transfers: Sit to/from Stand Sit to Stand: Min assist         General transfer comment: min assist to stand up with RW  Ambulation/Gait              General Gait Details: deferred due to standing BP being 97/43.  Stairs            Wheelchair Mobility    Modified Rankin (Stroke Patients Only)       Balance Overall balance assessment: Needs assistance Sitting-balance support: Feet supported Sitting balance-Leahy Scale: Good     Standing balance support: Bilateral upper extremity supported;During functional activity Standing balance-Leahy Scale: Fair Standing balance comment: less than fair dynamic balance                             Pertinent Vitals/Pain Pain Assessment: No/denies pain    Home Living Family/patient expects to be discharged to:: Private residence Living Arrangements: Alone Available Help at Discharge: Family Type of Home: Mobile home Home Access: Stairs to enter Entrance Stairs-Rails: Can reach both Entrance Stairs-Number of Steps: 4 Home Layout: One level Home Equipment: None Additional Comments: information from recent PT evaluation    Prior Function Level of Independence: Needs assistance   Gait / Transfers Assistance Needed: I gait with no AD and out in the community before admission  ADL's / Homemaking Assistance Needed: 6 months ago working and driving, more recently needed family help for complex activites like shopping  Comments: poor historian     Hand Dominance   Dominant Hand: Right    Extremity/Trunk Assessment   Upper Extremity Assessment Upper Extremity Assessment: Overall WFL for tasks assessed  Lower Extremity Assessment Lower Extremity Assessment: Generalized weakness    Cervical / Trunk Assessment Cervical / Trunk Assessment: Normal  Communication   Communication: No difficulties  Cognition Arousal/Alertness: Lethargic Behavior During Therapy: Flat affect Overall Cognitive Status: Impaired/Different from baseline Area of Impairment: Awareness;Problem solving;Safety/judgement;Following commands                 Orientation Level:  Situation     Following Commands: Follows one step commands with increased time Safety/Judgement: Decreased awareness of safety;Decreased awareness of deficits Awareness: Intellectual Problem Solving: Requires verbal cues;Requires tactile cues;Slow processing General Comments: reprompted her for transfers and exercises      General Comments      Exercises General Exercises - Lower Extremity Ankle Circles/Pumps: AROM;Both;5 reps Quad Sets: AROM;Both;10 reps Gluteal Sets: AROM;Both;10 reps Heel Slides: AROM;Both;20 reps Hip ABduction/ADduction: AROM;Both;10 reps Straight Leg Raises: AROM;Both;10 reps Other Exercises Other Exercises: all exercises were supine   Assessment/Plan    PT Assessment Patient needs continued PT services  PT Problem List Decreased strength;Decreased range of motion;Decreased activity tolerance;Decreased balance;Decreased mobility;Decreased coordination;Decreased cognition;Decreased knowledge of use of DME;Decreased safety awareness       PT Treatment Interventions DME instruction;Gait training;Stair training;Functional mobility training;Therapeutic activities;Therapeutic exercise;Balance training;Neuromuscular re-education;Cognitive remediation;Patient/family education    PT Goals (Current goals can be found in the Care Plan section)  Acute Rehab PT Goals Patient Stated Goal: go home PT Goal Formulation: With patient Time For Goal Achievement: 03/26/18 Potential to Achieve Goals: Fair    Frequency Min 2X/week   Barriers to discharge   not sure what help pt will have at DC    Co-evaluation               AM-PAC PT "6 Clicks" Daily Activity  Outcome Measure Difficulty turning over in bed (including adjusting bedclothes, sheets and blankets)?: A Little Difficulty moving from lying on back to sitting on the side of the bed? : Unable Difficulty sitting down on and standing up from a chair with arms (e.g., wheelchair, bedside commode, etc,.)?:  Unable Help needed moving to and from a bed to chair (including a wheelchair)?: A Little Help needed walking in hospital room?: A Little Help needed climbing 3-5 steps with a railing? : A Lot 6 Click Score: 13    End of Session Equipment Utilized During Treatment: Gait belt Activity Tolerance: Treatment limited secondary to medical complications (Comment)(BP was very low in standing) Patient left: in bed;with call bell/phone within reach;with nursing/sitter in room Nurse Communication: Mobility status;Other (comment)(informed nursing of standing BP to assist with meds) PT Visit Diagnosis: Unsteadiness on feet (R26.81);Other abnormalities of gait and mobility (R26.89);Muscle weakness (generalized) (M62.81);Difficulty in walking, not elsewhere classified (R26.2)    Time: 4627-0350 PT Time Calculation (min) (ACUTE ONLY): 28 min   Charges:   PT Evaluation $PT Eval Moderate Complexity: 1 Mod PT Treatments $Therapeutic Activity: 8-22 mins       Ramond Dial 03/12/2018, 5:07 PM   Mee Hives, PT MS Acute Rehab Dept. Number: [place] and [place]

## 2018-03-12 NOTE — Progress Notes (Signed)
D: Pt denies SI/HI/AVH and verbally is able to contract for safety. Pt. Has no complaints this evening. Pt. Participation with groups and snacks absent. Pt. Denies pain or restlessness. Pt. Presents this evening with confusion, thought blocking behaviors, flights of ideas, and some psychomotor retardation. Pt. Thought content is inconsistent with assessment questions and psychiatric symptoms scaling. Pt. When asked about depression and anxiety states "10/10" one moment and another moment states, "1/10", then when asked if her depression or anxiety is high or low she states it is, "low". Pt. Spent a majority of the shift isolative and withdrawn to her room resting.      A: Q x 15 minute observation checks were completed for safety. Patient was provided with education, but needs reinforcement.  Patient was given/offered medications per orders. Patient  was encourage to attend groups, participate in unit activities and continue with plan of care. Pt. Chart and plans of care reviewed. Pt. Given support and encouragement.   R: Patient is complaint with medication. 1:1 sitter maintained for safety. Pt. given Education extensively on falls safety education. Nephrologist notified of consult this evening. Attending notified pt. Was not scheduled for dialysis today.             Precautionary checks every 15 minutes for safety maintained, room free of safety hazards, patient sustains no injury or falls during this shift. Will endorse care to next shift.

## 2018-03-12 NOTE — BHH Counselor (Signed)
CW spoke with Sheran Lawless [phone]) with [facility]. She reports that she looked over the patients referral and will reach out to her administrator to staff the patient and make a decision on placement. She reports that she will call the CSW back with a decision.  Darin Engels, MSW, Latanya Presser, [place] Clinical Social Worker 03/12/2018 3:04 PM

## 2018-03-12 NOTE — Progress Notes (Signed)
Patient ID: Joyce [name], female   DOB: [birth year], 72 y.o.   MRN: [id number]  Spke with Dr. Zollie Scale. Patient will receive dialysis tomorrow.

## 2018-03-12 NOTE — Progress Notes (Addendum)
[facility] MD Progress Note  03/13/2018 6:10 PM Joyce [name]  MRN:  [id number]  Subjective:    Vaughan Basta received dialysis today. Sheis upbit and optimistic about the future. Deneis suicidal thoughts. She is vague on depressive symptoms wanting assurances that she will not be discharged "too soon". She has 1:1 sitter as she is still unable to ambulate. Unfortunately, due to orthostatic hypotonia, she has not been able to take advantage of physical therapy here.  Principal Problem: Bipolar I disorder, current or most recent episode depressed, with psychotic features [facility]) Diagnosis:   Patient Active Problem List   Diagnosis Date Noted  . Bipolar I disorder, current or most recent episode depressed, with psychotic features ([place]) [F31.5] 02/26/2018    Priority: High  . Bipolar affective disorder, current episode depressed with psychotic symptoms ([place]) [F31.2] 09/17/2017    Priority: High  . Bipolar I disorder, most recent episode (or current) manic ([place]) [F31.10] 09/02/2017    Priority: High  . Suicide attempt ([place]) [T14.91XA] 03/09/2018  . Acute respiratory failure with hypoxia ([place]) [J96.01] 03/04/2018  . UTI (urinary tract infection) [N39.0] 12/16/2017  . Palliative care encounter [Z51.5]   . Acute delirium [R41.0] 09/02/2017  . Altered mental status [R41.82]   . Acute encephalopathy [G93.40] 09/01/2017  . ESRD on dialysis ([place]) [N18.6, Z99.2] 04/06/2017  . Complication of vascular access for dialysis [T82.9XXA] 04/06/2017  . Hematuria [R31.9] 12/18/2016  . Polycystic kidney [Q61.3] 10/12/2016  . Ruptured cyst of kidney [Q61.00] 10/03/2016  . HTN (hypertension) [I10] 10/01/2016  . Chronic kidney disease [N18.9] 10/01/2016  . Anemia associated with chronic renal failure [N18.9, D63.1] 09/24/2016  . Acute kidney insufficiency [N28.9] 07/28/2016  . Noninfectious diarrhea [K52.9]   . Benign neoplasm of cecum [D12.0]   . Benign neoplasm of ascending colon [D12.2]   . Diarrhea [R19.7]   .  Nausea [R11.0]   . Gastric polyp [K31.7]   . Chronic constipation [K59.09] 12/27/2014  . Nausea with vomiting [R11.2] 12/27/2014  . Left sided abdominal pain [R10.9] 12/27/2014  . Personal history of colonic polyps [Z86.010] 05/10/2013   Total Time spent with patient: 20 minutes  Past Psychiatric History: bipolar disorder  Past Medical History:  Past Medical History:  Diagnosis Date  . Anal fissure   . Bipolar affective disorder ([place])   . CKD (chronic kidney disease)    Dr Holley Raring [name] 4  . Colon polyps   . Diverticulitis   . Diverticulitis   . Family history of adverse reaction to anesthesia    mom - PONV  . GERD (gastroesophageal reflux disease)   . Headache    migraines - none over 10 yrs  . Heart murmur   . History of hiatal hernia   . Hypertension   . Pancreatitis    Valproic acid  . Vertigo     Past Surgical History:  Procedure Laterality Date  . A/V FISTULAGRAM Left 04/14/2017   Procedure: A/V Fistulagram;  Surgeon: Katha Cabal, MD;  Location: [place] [facility];  Service: Cardiovascular;  Laterality: Left;  . A/V FISTULAGRAM Left 06/09/2017   Procedure: A/V FISTULAGRAM;  Surgeon: Katha Cabal, MD;  Location: [place] [facility];  Service: Cardiovascular;  Laterality: Left;  . A/V FISTULAGRAM Left 11/02/2017   Procedure: A/V FISTULAGRAM;  Surgeon: Algernon Huxley, MD;  Location: [place] [facility];  Service: Cardiovascular;  Laterality: Left;  . A/V SHUNT INTERVENTION N/A 11/02/2017   Procedure: A/V SHUNT INTERVENTION;  Surgeon: Algernon Huxley, MD;  Location:  [place] [facility];  Service: Cardiovascular;  Laterality: N/A;  . ABDOMINAL HYSTERECTOMY  1990 ?  . AV FISTULA PLACEMENT  4/30  . BREAST EXCISIONAL BIOPSY Left 1994   neg surgical bx  . CHOLECYSTECTOMY  2003  . COLONOSCOPY  2014   Dr. Jamal Collin  . COLONOSCOPY WITH PROPOFOL N/A 09/24/2015   Procedure: COLONOSCOPY WITH random colon byopies.;  Surgeon: Lucilla Lame, MD;  Location: [place];  Service: Endoscopy;  Laterality: N/A;  . ESOPHAGOGASTRODUODENOSCOPY (EGD) WITH PROPOFOL N/A 09/24/2015   Procedure: ESOPHAGOGASTRODUODENOSCOPY (EGD) ;  Surgeon: Lucilla Lame, MD;  Location: [place];  Service: Endoscopy;  Laterality: N/A;  . EYE SURGERY    . PERIPHERAL VASCULAR CATHETERIZATION N/A 05/29/2015   Procedure: A/V Shuntogram/Fistulagram;  Surgeon: Katha Cabal, MD;  Location: [place] [facility];  Service: Cardiovascular;  Laterality: N/A;  . PERIPHERAL VASCULAR CATHETERIZATION N/A 05/29/2015   Procedure: A/V Shunt Intervention;  Surgeon: Katha Cabal, MD;  Location: [place] [facility];  Service: Cardiovascular;  Laterality: N/A;  . POLYPECTOMY  09/24/2015   Procedure: POLYPECTOMY INTESTINAL;  Surgeon: Lucilla Lame, MD;  Location: [place];  Service: Endoscopy;;  cecal polyp ascending polyp   Family History:  Family History  Problem Relation Age of Onset  . Stroke Father   . Hypertension Father   . Breast cancer Other   . Colon cancer Neg Hx   . Liver disease Neg Hx    Family Psychiatric  History: none Social History:  Social History   Substance and Sexual Activity  Alcohol Use No  . Alcohol/week: 0.0 standard drinks     Social History   Substance and Sexual Activity  Drug Use No    Social History   Socioeconomic History  . Marital status: Single    Spouse name: Not on file  . Number of children: 1  . Years of education: Not on file  . Highest education level: Not on file  Occupational History  . Occupation: Surveyor, quantity: [employer]  Social Needs  . Financial resource strain: Not on file  . Food insecurity:    Worry: Not on file    Inability: Not on file  . Transportation needs:    Medical: Not on file    Non-medical: Not on file  Tobacco Use  . Smoking status: Never Smoker  . Smokeless tobacco: Never Used  Substance and Sexual Activity  . Alcohol use: No    Alcohol/week: 0.0 standard drinks  . Drug use: No   . Sexual activity: Not Currently  Lifestyle  . Physical activity:    Days per week: Not on file    Minutes per session: Not on file  . Stress: Not on file  Relationships  . Social connections:    Talks on phone: Not on file    Gets together: Not on file    Attends religious service: Not on file    Active member of club or organization: Not on file    Attends meetings of clubs or organizations: Not on file    Relationship status: Not on file  Other Topics Concern  . 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   Additional Social History:                         Sleep: Fair  Appetite:  Fair  Current Medications: Current Facility-Administered Medications  Medication Dose Route Frequency Provider Last Rate Last Dose  . acetaminophen (TYLENOL) tablet 650 mg  650 mg Oral Q6H PRN Clapacs, [name], MD      . alum & mag hydroxide-simeth (MAALOX/MYLANTA) 200-200-20 MG/5ML suspension 30 mL  30 mL Oral Q4H PRN Clapacs, [name], MD      . brimonidine (ALPHAGAN) 0.2 % ophthalmic solution 1 drop  1 drop Both Eyes BID Clapacs, [name], MD   1 drop at 03/13/18 1606  . carbamazepine (TEGRETOL) tablet 200 mg  200 mg Oral TID Clapacs, [name], MD   200 mg at 03/13/18 1606  . dicyclomine (BENTYL) tablet 20 mg  20 mg Oral TID AC Clapacs, [name], MD   20 mg at 03/13/18 1606  . famotidine (PEPCID) tablet 20 mg  20 mg Oral Daily Clapacs, [name], MD   20 mg at 03/13/18 0807  . feeding supplement (NEPRO CARB STEADY) liquid 237 mL  237 mL Oral BID BM Clapacs, [name], MD   237 mL at 03/13/18 1514  . haloperidol (HALDOL) tablet 2.5 mg  2.5 mg Oral Q M,W,F Clapacs, [name], MD   2.5 mg at 03/12/18 1140  . magnesium hydroxide (MILK OF MAGNESIA) suspension 30 mL  30 mL Oral Daily PRN Clapacs, [name], MD      . metoprolol tartrate (LOPRESSOR) tablet 25 mg  25 mg Oral BID Clapacs, [name], MD   25 mg at 03/13/18 1606  .  multivitamin (RENA-VIT) tablet 1 tablet  1 tablet Oral QHS [name], [name], MD   1 tablet at 03/12/18 2114  . pentafluoroprop-tetrafluoroeth (GEBAUERS) aerosol   Topical PRN Clapacs, [name], MD      . rOPINIRole (REQUIP) tablet 2 mg  2 mg Oral Daily Clapacs, [name], MD   2 mg at 03/13/18 0807  . timolol (TIMOPTIC) 0.5 % ophthalmic solution 1 drop  1 drop Both Eyes BID Clapacs, [name], MD   1 drop at 03/13/18 1607  . tuberculin injection 5 Units  5 Units Intradermal Once [name], [name], MD   5 Units at 03/11/18 2138  . vitamin C (ASCORBIC ACID) tablet 250 mg  250 mg Oral BID [name], [name], MD   250 mg at 03/13/18 0807  . ziprasidone (GEODON) capsule 60 mg  60 mg Oral BID WC [name], [name], MD   60 mg at 03/13/18 1606    Lab Results:  Results for orders placed or performed during the hospital encounter of 03/11/18 (from the past 48 hour(s))  Phosphorus     Status: None   Collection Time: 03/13/18 10:39 AM  Result Value Ref Range   Phosphorus 4.5 2.5 - 4.6 mg/dL    Comment: Performed at [facility], [address] [place], [place] [address]    Blood Alcohol level:  Lab Results  Component Value Date   [facility] <10 03/04/2018   ETH <10 47/82/9562    Metabolic Disorder Labs: Lab Results  Component Value Date   HGBA1C 4.8 09/18/2017   MPG 91.06 09/18/2017   No results found for: PROLACTIN Lab Results  Component Value Date   CHOL 251 (H) 09/18/2017   TRIG 124 09/18/2017   HDL 66 09/18/2017   CHOLHDL 3.8 09/18/2017   VLDL 25 09/18/2017   LDLCALC 160 (H) 09/18/2017   LDLCALC 43 05/14/2014    Physical Findings: AIMS:  , ,  ,  ,  CIWA:    COWS:     Musculoskeletal: Strength & Muscle Tone: decreased Gait & Station: unsteady Patient leans: N/A  Psychiatric Specialty Exam: Physical Exam  Nursing note and vitals reviewed. Psychiatric: She has a normal mood and affect. Her speech is normal and behavior is normal. Thought content normal.  Cognition and memory are normal. She expresses impulsivity.    Review of Systems  Neurological: Negative.   Psychiatric/Behavioral: Positive for depression.  All other systems reviewed and are negative.   Blood pressure (!) 120/57, pulse 73, temperature 97.8 F (36.6 C), temperature source Oral, resp. rate (!) 21, height 5\' 3"  (1.6 m), weight 65.5 kg, SpO2 99 %.Body mass index is 25.58 kg/m.  General Appearance: Casual  Eye Contact:  Good  Speech:  Clear and Coherent  Volume:  Normal  Mood:  Anxious  Affect:  Appropriate  Thought Process:  Goal Directed and Descriptions of Associations: Intact  Orientation:  Full (Time, Place, and Person)  Thought Content:  WDL  Suicidal Thoughts:  No  Homicidal Thoughts:  No  Memory:  Immediate;   Fair Recent;   Fair Remote;   Fair  Judgement:  Impaired  Insight:  Shallow  Psychomotor Activity:  Psychomotor Retardation  Concentration:  Concentration: Fair and Attention Span: Fair  Recall:  [employer] of Knowledge:  Fair  Language:  Fair  Akathisia:  No  Handed:  Right  AIMS (if indicated):     Assets:  Communication Skills Desire for Improvement Financial Resources/Insurance Housing Resilience Social Support  ADL's:  Intact  Cognition:  WNL  Sleep:  Number of Hours: 7.45     Treatment Plan Summary: Daily contact with patient to assess and evaluate symptoms and progress in treatment and Medication management   Ms. Wimberley is a 72 year old female with a history of bipolar disorder, recently discharged from psychiatry, who was readmitted immediately after discharge after Intentional overdose on medications when facing real or imaginary financial burden of dialysis. She was intubated and admitted to ICU. She returns to psychiatry unable to ambulate, swallow and, psychtoic with paranoid delusions and hallucinations.   #Mood/psychosis, improving -continue Tegretol 200 mg TID -Haldol 2.5 mg before dialysis on M,W,Fri -increase Geodon  to 60 mg BID  #HTN -Metoprolol 25 mg BID  #Dialysis -nephrology input is appreciated  #Deconditioning, improving -unable to stand or ambulate -PT  -SNF recommended after discharge  #Dysphagia -mechanical soft diet with thin liquids -speech pathology eval  #GERD -Pepcid 20 mg  #Restless legs -Requip 2 mg nightly  #Glaucoma -continue eye drops  #Disposition -patient will eventually return to live with her son -patient referred to SNF but so far rejected by several facilities -follow up with [facility]    Orson Slick, MD 03/13/2018, 6:10 PM

## 2018-03-12 NOTE — Tx Team (Signed)
Interdisciplinary Treatment and Diagnostic Plan Update  03/12/2018 Time of Session: 10:50am Joyce [name] MRN: [id number]  Principal Diagnosis: Bipolar I disorder, current or most recent episode depressed, with psychotic features ([place])  Secondary Diagnoses: Principal Problem:   Bipolar I disorder, current or most recent episode depressed, with psychotic features ([place]) Active Problems:   HTN (hypertension)   ESRD on dialysis ([place])   Suicide attempt ([place])   Current Medications:  Current Facility-Administered Medications  Medication Dose Route Frequency Provider Last Rate Last Dose  . acetaminophen (TYLENOL) tablet 650 mg  650 mg Oral Q6H PRN Clapacs, [name], MD      . alum & mag hydroxide-simeth (MAALOX/MYLANTA) 200-200-20 MG/5ML suspension 30 mL  30 mL Oral Q4H PRN Clapacs, [name], MD      . brimonidine (ALPHAGAN) 0.2 % ophthalmic solution 1 drop  1 drop Both Eyes BID Clapacs, [name], MD   1 drop at 03/12/18 0803  . carbamazepine (TEGRETOL) tablet 200 mg  200 mg Oral TID Clapacs, [name], MD   200 mg at 03/12/18 0805  . dicyclomine (BENTYL) tablet 20 mg  20 mg Oral TID AC Clapacs, [name], MD   20 mg at 03/12/18 0803  . famotidine (PEPCID) tablet 20 mg  20 mg Oral Daily Clapacs, [name], MD   20 mg at 03/12/18 5400  . feeding supplement (NEPRO CARB STEADY) liquid 237 mL  237 mL Oral BID BM Clapacs, [name], MD   237 mL at 03/11/18 1446  . haloperidol (HALDOL) tablet 2.5 mg  2.5 mg Oral Q M,W,F Clapacs, [name], MD      . magnesium hydroxide (MILK OF MAGNESIA) suspension 30 mL  30 mL Oral Daily PRN Clapacs, [name], MD      . metoprolol tartrate (LOPRESSOR) tablet 25 mg  25 mg Oral BID Clapacs, [name], MD   25 mg at 03/12/18 0805  . multivitamin (RENA-VIT) tablet 1 tablet  1 tablet Oral QHS [name], [name], MD   1 tablet at 03/11/18 2119  . pentafluoroprop-tetrafluoroeth (GEBAUERS) aerosol   Topical PRN Clapacs, [name], MD      . rOPINIRole (REQUIP) tablet 2 mg  2 mg Oral Daily  Clapacs, [name], MD   2 mg at 03/12/18 0803  . timolol (TIMOPTIC) 0.5 % ophthalmic solution 1 drop  1 drop Both Eyes BID Clapacs, [name], MD   1 drop at 03/12/18 0803  . tuberculin injection 5 Units  5 Units Intradermal Once [name], [name], MD   5 Units at 03/11/18 2138  . vitamin C (ASCORBIC ACID) tablet 250 mg  250 mg Oral BID [name], [name], MD   250 mg at 03/12/18 0803  . ziprasidone (GEODON) capsule 60 mg  60 mg Oral BID WC [name], [name], MD       PTA Medications: Medications Prior to Admission  Medication Sig Dispense Refill Last Dose  . acetaminophen (TYLENOL) 325 MG tablet Take 325 mg 2 (two) times daily as needed by mouth for moderate pain or headache.    PRN at PRN  . brimonidine (ALPHAGAN) 0.2 % ophthalmic solution Place 1 drop into both eyes 2 (two) times daily. 5 mL 1 Unknown at Unknown  . carbamazepine (TEGRETOL) 200 MG tablet Take 1 tablet (200 mg total) by mouth 3 (three) times daily. 90 tablet 3   . cholecalciferol (VITAMIN D) 1000 UNITS tablet Take 1,000 Units by mouth daily.   Unknown at Unknown  . cyanocobalamin (,  VITAMIN B-12,) 1000 MCG/ML injection Inject 1,000 mcg into the muscle every 30 (thirty) days.   Unknown at Unknown  . dicyclomine (BENTYL) 20 MG tablet Take 1 tablet (20 mg total) by mouth 3 (three) times daily before meals. 90 tablet 1 Unknown at Unknown  . famotidine (PEPCID) 20 MG tablet TAKE 1 TABLET BY MOUTH EVERY DAY 30 tablet 1 Unknown at Unknown  . haloperidol (HALDOL) 5 MG tablet Take 0.5 tablets (2.5 mg total) by mouth every Monday, Wednesday, and Friday with hemodialysis AND 1 tablet (5 mg total) at bedtime. 36 tablet 3   . lidocaine (LIDODERM) 5 % Place 1 patch onto the skin daily. Remove & Discard patch within 12 hours or as directed by MD (Patient not taking: Reported on 02/26/2018) 30 patch 0 Not Taking at Unknown time  . lidocaine-prilocaine (EMLA) cream Apply 1 application every Monday, Wednesday, and Friday topically. At dialysis   3 PRN at PRN  . metoprolol tartrate (LOPRESSOR) 25 MG tablet Take 1 tablet (25 mg total) by mouth 2 (two) times daily. 60 tablet 1   . multivitamin (RENA-VIT) TABS tablet TAKE 1 TABLET BY MOUTH EVERYDAY AT BEDTIME (Patient taking differently: No sig reported) 30 tablet 0 Unknown at Unknown  . rOPINIRole (REQUIP) 2 MG tablet Take 1 tablet (2 mg total) by mouth daily. 30 tablet 3   . temazepam (RESTORIL) 15 MG capsule Take 1 capsule (15 mg total) by mouth at bedtime as needed for sleep. 30 capsule 0 PRN at PRN  . timolol (TIMOPTIC) 0.5 % ophthalmic solution Place 1 drop into both eyes 2 (two) times daily. 10 mL 1 Unknown at Unknown    Patient Stressors: Health problems Loss of spouse  Patient Strengths: Scientist, research (life sciences) Supportive family/friends  Treatment Modalities: Medication Management, Group therapy, Case management,  1 to 1 session with clinician, Psychoeducation, Recreational therapy.   Physician Treatment Plan for Primary Diagnosis: Bipolar I disorder, current or most recent episode depressed, with psychotic features ([place]) Long Term Goal(s): Improvement in symptoms so as ready for discharge NA   Short Term Goals: Ability to identify changes in lifestyle to reduce recurrence of condition will improve Ability to verbalize feelings will improve Ability to disclose and discuss suicidal ideas Ability to demonstrate self-control will improve Ability to identify and develop effective coping behaviors will improve Ability to maintain clinical measurements within normal limits will improve Compliance with prescribed medications will improve Ability to identify triggers associated with substance abuse/mental health issues will improve NA  Medication Management: Evaluate patient's response, side effects, and tolerance of medication regimen.  Therapeutic Interventions: 1 to 1 sessions, Unit Group sessions and Medication administration.  Evaluation of Outcomes: Progressing  Physician  Treatment Plan for Secondary Diagnosis: Principal Problem:   Bipolar I disorder, current or most recent episode depressed, with psychotic features ([place]) Active Problems:   HTN (hypertension)   ESRD on dialysis ([place])   Suicide attempt ([place])  Long Term Goal(s): Improvement in symptoms so as ready for discharge NA   Short Term Goals: Ability to identify changes in lifestyle to reduce recurrence of condition will improve Ability to verbalize feelings will improve Ability to disclose and discuss suicidal ideas Ability to demonstrate self-control will improve Ability to identify and develop effective coping behaviors will improve Ability to maintain clinical measurements within normal limits will improve Compliance with prescribed medications will improve Ability to identify triggers associated with substance abuse/mental health issues will improve NA     Medication Management: Evaluate patient's response, side  effects, and tolerance of medication regimen.  Therapeutic Interventions: 1 to 1 sessions, Unit Group sessions and Medication administration.  Evaluation of Outcomes: Progressing   RN Treatment Plan for Primary Diagnosis: Bipolar I disorder, current or most recent episode depressed, with psychotic features ([place]) Long Term Goal(s): Knowledge of disease and therapeutic regimen to maintain health will improve  Short Term Goals: Ability to remain free from injury will improve, Ability to participate in decision making will improve, Ability to disclose and discuss suicidal ideas and Ability to identify and develop effective coping behaviors will improve  Medication Management: RN will administer medications as ordered by provider, will assess and evaluate patient's response and provide education to patient for prescribed medication. RN will report any adverse and/or side effects to prescribing provider.  Therapeutic Interventions: 1 on 1 counseling sessions, Psychoeducation, Medication  administration, Evaluate responses to treatment, Monitor vital signs and CBGs as ordered, Perform/monitor CIWA, COWS, AIMS and Fall Risk screenings as ordered, Perform wound care treatments as ordered.  Evaluation of Outcomes: Progressing   LCSW Treatment Plan for Primary Diagnosis: Bipolar I disorder, current or most recent episode depressed, with psychotic features ([place]) Long Term Goal(s): Safe transition to appropriate next level of care at discharge, Engage patient in therapeutic group addressing interpersonal concerns.  Short Term Goals: Engage patient in aftercare planning with referrals and resources, Increase emotional regulation, Identify triggers associated with mental health/substance abuse issues and Increase skills for wellness and recovery  Therapeutic Interventions: Assess for all discharge needs, 1 to 1 time with Social worker, Explore available resources and support systems, Assess for adequacy in community support network, Educate family and significant other(s) on suicide prevention, Complete Psychosocial Assessment, Interpersonal group therapy.  Evaluation of Outcomes: Progressing   Progress in Treatment: Attending groups: No. Participating in groups: No. Taking medication as prescribed: Yes. Toleration medication: Yes. Family/Significant other contact made: No, will contact:  Family Contact Patient understands diagnosis: Yes. Discussing patient identified problems/goals with staff: Yes. Medical problems stabilized or resolved: Yes. Denies suicidal/homicidal ideation: Yes. Issues/concerns per patient self-inventory: No. Other:   New problem(s) identified: No, Describe:  None  New Short Term/Long Term Goal(s): "When I go home, I will be ready and not asked to go home. I will be ready."  Patient Goals:  "When I go home, I will be ready and not asked to go home. I will be ready."  Discharge Plan or Barriers: To either return home with her son or go to a nursing  facility and follow up with outpatient.   Reason for Continuation of Hospitalization: Medical Issues Medication stabilization  Estimated Length of Stay: 7 days  Attendees: Patient: Joyce [name] 03/12/2018 10:54 AM  Physician: Dr. Bary Leriche, MD 03/12/2018 10:54 AM  Nursing: Alyson Locket, RN 03/12/2018 10:54 AM  RN Care Manager: 03/12/2018 10:54 AM  Social Worker: Darin Engels, LCSW 03/12/2018 10:54 AM  Recreational Therapist:  03/12/2018 10:54 AM  Other: Marney Doctor, [place] 03/12/2018 10:54 AM  Other: Derrek Gu, LCSW 03/12/2018 10:54 AM  Other: Sitter 03/12/2018 10:54 AM    Scribe for Treatment Team: Darin Engels, LCSW 03/12/2018 10:54 AM

## 2018-03-12 NOTE — Plan of Care (Signed)
Pt. Is complaint with medications. Pt. Is able to verbally contract for safety. Pt. Denies Si/hi and verbally is able to contract for safety. Pt. Presents this evening with confusion, thought blocking behaviors, flights of ideas, and some psychomotor retardation. Pt. Needs reinforcement of provided education. Pt. Does not participate this evening in unit activities or groups or snacks. Pt. Denies pain. Pt. Thought content is inconsistent with assessment questions and psychiatric symptoms scaling.    Problem: Health Behavior/Discharge Planning: Goal: Compliance with therapeutic regimen will improve Outcome: Progressing   Problem: Self-Concept: Goal: Ability to disclose and discuss suicidal ideas will improve Outcome: Progressing

## 2018-03-12 NOTE — Plan of Care (Signed)
Patient seems to be more alert today, more animated and talkative today. Patient denies SI, HI and AVH. Patient able to sit up on the side of the bed and feed self. Patient states, "I feel much better today, than yesterday." Patient still has mild confusion, with some disorganized thinking. Patient is compliant with medications. CNA is present in room for safety. No self injurious behaviors noted. Problem: Activity: Goal: Interest or engagement in leisure activities will improve Outcome: Not Progressing   Problem: Coping: Goal: Coping ability will improve Outcome: Not Progressing Goal: Will verbalize feelings Outcome: Progressing   Problem: Health Behavior/Discharge Planning: Goal: Compliance with therapeutic regimen will improve Outcome: Not Progressing   Problem: Education: Goal: Ability to make informed decisions regarding treatment will improve Outcome: Not Progressing

## 2018-03-13 LAB — PHOSPHORUS: Phosphorus: 4.5 mg/dL (ref 2.5–4.6)

## 2018-03-13 NOTE — Progress Notes (Signed)
Patient in bed resting, eyes closed. Sitter at bedside for safety.

## 2018-03-13 NOTE — Progress Notes (Signed)
Patient off of unit in dialysis.  

## 2018-03-13 NOTE — Progress Notes (Signed)
In bed resting, sitter at bedside for safety. In no apparent distress.

## 2018-03-13 NOTE — Progress Notes (Signed)
Patient in bed resting, eyes closed, sitter at bedside for safety

## 2018-03-13 NOTE — Progress Notes (Signed)
Hd completed 

## 2018-03-13 NOTE — Progress Notes (Signed)
Patient in bed resting in no distress.

## 2018-03-13 NOTE — Progress Notes (Signed)
Patient off unit in dialysis.

## 2018-03-13 NOTE — Progress Notes (Signed)
Visiting with family in day room

## 2018-03-13 NOTE — Progress Notes (Signed)
[facility] MD Progress Note  03/14/2018 1:06 PM Joyce [name]  MRN:  [id number]  Subjective:    Ms. Fritsche is doing quite well. She completed dialysis yesterday without any problems and received a visit from her daughter in law that went well. The patient understands that she is welcome home with family. She is troubled by the fact that she does not remember much details since her last discharge from psychiatry and the ICU hospitalization is just a blair.   Today she is in good spirits. Still has a sitter but has been able to walk to the bathroom with assistance. She slept well. She is eating lunch as we speak with gusto. She does not report any side effects from Geodon. When asked if she still needs to take 2.5 mg Haldol prior to dialysis, she could not tell.   Principal Problem: Bipolar I disorder, current or most recent episode depressed, with psychotic features [facility]) Diagnosis:   Patient Active Problem List   Diagnosis Date Noted  . Bipolar I disorder, current or most recent episode depressed, with psychotic features ([place]) [F31.5] 02/26/2018    Priority: High  . Bipolar affective disorder, current episode depressed with psychotic symptoms ([place]) [F31.2] 09/17/2017    Priority: High  . Bipolar I disorder, most recent episode (or current) manic ([employer]) [F31.10] 09/02/2017    Priority: High  . Suicide attempt ([place]) [T14.91XA] 03/09/2018  . Acute respiratory failure with hypoxia ([place]) [J96.01] 03/04/2018  . UTI (urinary tract infection) [N39.0] 12/16/2017  . Palliative care encounter [Z51.5]   . Acute delirium [R41.0] 09/02/2017  . Altered mental status [R41.82]   . Acute encephalopathy [G93.40] 09/01/2017  . ESRD on dialysis ([place]) [N18.6, Z99.2] 04/06/2017  . Complication of vascular access for dialysis [T82.9XXA] 04/06/2017  . Hematuria [R31.9] 12/18/2016  . Polycystic kidney [Q61.3] 10/12/2016  . Ruptured cyst of kidney [Q61.00] 10/03/2016  . HTN (hypertension) [I10] 10/01/2016  .  Chronic kidney disease [N18.9] 10/01/2016  . Anemia associated with chronic renal failure [N18.9, D63.1] 09/24/2016  . Acute kidney insufficiency [N28.9] 07/28/2016  . Noninfectious diarrhea [K52.9]   . Benign neoplasm of cecum [D12.0]   . Benign neoplasm of ascending colon [D12.2]   . Diarrhea [R19.7]   . Nausea [R11.0]   . Gastric polyp [K31.7]   . Chronic constipation [K59.09] 12/27/2014  . Nausea with vomiting [R11.2] 12/27/2014  . Left sided abdominal pain [R10.9] 12/27/2014  . Personal history of colonic polyps [Z86.010] 05/10/2013   Total Time spent with patient: 20 minutes  Past Psychiatric History: bipolar disorder  Past Medical History:  Past Medical History:  Diagnosis Date  . Anal fissure   . Bipolar affective disorder ([place])   . CKD (chronic kidney disease)    Dr Holley Raring [name] 4  . Colon polyps   . Diverticulitis   . Diverticulitis   . Family history of adverse reaction to anesthesia    mom - PONV  . GERD (gastroesophageal reflux disease)   . Headache    migraines - none over 10 yrs  . Heart murmur   . History of hiatal hernia   . Hypertension   . Pancreatitis    Valproic acid  . Vertigo     Past Surgical History:  Procedure Laterality Date  . A/V FISTULAGRAM Left 04/14/2017   Procedure: A/V Fistulagram;  Surgeon: Katha Cabal, MD;  Location: [place] [facility];  Service: Cardiovascular;  Laterality: Left;  . A/V FISTULAGRAM Left 06/09/2017   Procedure: A/V FISTULAGRAM;  Surgeon: Katha Cabal, MD;  Location: [place] [facility];  Service: Cardiovascular;  Laterality: Left;  . A/V FISTULAGRAM Left 11/02/2017   Procedure: A/V FISTULAGRAM;  Surgeon: Algernon Huxley, MD;  Location: [place] [facility];  Service: Cardiovascular;  Laterality: Left;  . A/V SHUNT INTERVENTION N/A 11/02/2017   Procedure: A/V SHUNT INTERVENTION;  Surgeon: Algernon Huxley, MD;  Location: [place] [facility];  Service: Cardiovascular;  Laterality: N/A;  . ABDOMINAL  HYSTERECTOMY  1990 ?  . AV FISTULA PLACEMENT  4/30  . BREAST EXCISIONAL BIOPSY Left 1994   neg surgical bx  . CHOLECYSTECTOMY  2003  . COLONOSCOPY  2014   Dr. Jamal Collin  . COLONOSCOPY WITH PROPOFOL N/A 09/24/2015   Procedure: COLONOSCOPY WITH random colon byopies.;  Surgeon: Lucilla Lame, MD;  Location: [place];  Service: Endoscopy;  Laterality: N/A;  . ESOPHAGOGASTRODUODENOSCOPY (EGD) WITH PROPOFOL N/A 09/24/2015   Procedure: ESOPHAGOGASTRODUODENOSCOPY (EGD) ;  Surgeon: Lucilla Lame, MD;  Location: [place];  Service: Endoscopy;  Laterality: N/A;  . EYE SURGERY    . PERIPHERAL VASCULAR CATHETERIZATION N/A 05/29/2015   Procedure: A/V Shuntogram/Fistulagram;  Surgeon: Katha Cabal, MD;  Location: [place] [facility];  Service: Cardiovascular;  Laterality: N/A;  . PERIPHERAL VASCULAR CATHETERIZATION N/A 05/29/2015   Procedure: A/V Shunt Intervention;  Surgeon: Katha Cabal, MD;  Location: [place] [facility];  Service: Cardiovascular;  Laterality: N/A;  . POLYPECTOMY  09/24/2015   Procedure: POLYPECTOMY INTESTINAL;  Surgeon: Lucilla Lame, MD;  Location: [place];  Service: Endoscopy;;  cecal polyp ascending polyp   Family History:  Family History  Problem Relation Age of Onset  . Stroke Father   . Hypertension Father   . Breast cancer Other   . Colon cancer Neg Hx   . Liver disease Neg Hx    Family Psychiatric  History: none Social History:  Social History   Substance and Sexual Activity  Alcohol Use No  . Alcohol/week: 0.0 standard drinks     Social History   Substance and Sexual Activity  Drug Use No    Social History   Socioeconomic History  . Marital status: Single    Spouse name: Not on file  . Number of children: 1  . Years of education: Not on file  . Highest education level: Not on file  Occupational History  . Occupation: Surveyor, quantity: [employer]  Social Needs  . Financial resource strain: Not on file  . Food  insecurity:    Worry: Not on file    Inability: Not on file  . Transportation needs:    Medical: Not on file    Non-medical: Not on file  Tobacco Use  . Smoking status: Never Smoker  . Smokeless tobacco: Never Used  Substance and Sexual Activity  . Alcohol use: No    Alcohol/week: 0.0 standard drinks  . Drug use: No  . Sexual activity: Not Currently  Lifestyle  . Physical activity:    Days per week: Not on file    Minutes per session: Not on file  . Stress: Not on file  Relationships  . Social connections:    Talks on phone: Not on file    Gets together: Not on file    Attends religious service: Not on file    Active member of club or organization: Not on file    Attends meetings of clubs or organizations: Not on file    Relationship status:  Not on file  Other Topics Concern  . 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   Additional Social History:                         Sleep: Fair  Appetite:  Fair  Current Medications: Current Facility-Administered Medications  Medication Dose Route Frequency Provider Last Rate Last Dose  . acetaminophen (TYLENOL) tablet 650 mg  650 mg Oral Q6H PRN Clapacs, [name], MD      . alum & mag hydroxide-simeth (MAALOX/MYLANTA) 200-200-20 MG/5ML suspension 30 mL  30 mL Oral Q4H PRN Clapacs, [name], MD      . brimonidine (ALPHAGAN) 0.2 % ophthalmic solution 1 drop  1 drop Both Eyes BID Clapacs, [name], MD   1 drop at 03/14/18 0841  . carbamazepine (TEGRETOL) tablet 200 mg  200 mg Oral TID Clapacs, [name], MD   200 mg at 03/14/18 1224  . dicyclomine (BENTYL) tablet 20 mg  20 mg Oral TID AC Clapacs, [name], MD   20 mg at 03/14/18 1224  . famotidine (PEPCID) tablet 20 mg  20 mg Oral Daily Clapacs, [name], MD   20 mg at 03/14/18 0841  . feeding supplement (NEPRO CARB STEADY) liquid 237 mL  237 mL Oral BID BM Clapacs, [name], MD   237 mL at 03/13/18  1514  . haloperidol (HALDOL) tablet 2.5 mg  2.5 mg Oral Q M,W,F Clapacs, [name], MD   2.5 mg at 03/12/18 1140  . magnesium hydroxide (MILK OF MAGNESIA) suspension 30 mL  30 mL Oral Daily PRN Clapacs, [name], MD      . metoprolol tartrate (LOPRESSOR) tablet 25 mg  25 mg Oral BID Clapacs, [name], MD   25 mg at 03/13/18 1606  . multivitamin (RENA-VIT) tablet 1 tablet  1 tablet Oral QHS [name], [name], MD   1 tablet at 03/13/18 2137  . pentafluoroprop-tetrafluoroeth (GEBAUERS) aerosol   Topical PRN Clapacs, [name], MD      . rOPINIRole (REQUIP) tablet 2 mg  2 mg Oral Daily Clapacs, [name], MD   2 mg at 03/14/18 0841  . timolol (TIMOPTIC) 0.5 % ophthalmic solution 1 drop  1 drop Both Eyes BID Clapacs, [name], MD   1 drop at 03/14/18 0841  . vitamin C (ASCORBIC ACID) tablet 250 mg  250 mg Oral BID [name], [name], MD   250 mg at 03/14/18 0841  . ziprasidone (GEODON) capsule 60 mg  60 mg Oral BID WC [name], [name], MD   60 mg at 03/14/18 6967    Lab Results:  Results for orders placed or performed during the hospital encounter of 03/11/18 (from the past 48 hour(s))  Phosphorus     Status: None   Collection Time: 03/13/18 10:39 AM  Result Value Ref Range   Phosphorus 4.5 2.5 - 4.6 mg/dL    Comment: Performed at [facility], [address] [place], [place] [address]    Blood Alcohol level:  Lab Results  Component Value Date   [facility] <10 03/04/2018   ETH <10 01/75/1025    Metabolic Disorder Labs: Lab Results  Component Value Date   HGBA1C 4.8 09/18/2017   MPG 91.06 09/18/2017   No results found for: PROLACTIN Lab Results  Component Value Date   CHOL 251 (H) 09/18/2017  TRIG 124 09/18/2017   HDL 66 09/18/2017   CHOLHDL 3.8 09/18/2017   VLDL 25 09/18/2017   LDLCALC 160 (H) 09/18/2017   LDLCALC 43 05/14/2014    Physical Findings: AIMS:  , ,  ,  ,    CIWA:    COWS:     Musculoskeletal: Strength & Muscle Tone: decreased Gait & Station:  unsteady Patient leans: N/A  Psychiatric Specialty Exam: Physical Exam  Nursing note and vitals reviewed. Psychiatric: She has a normal mood and affect. Her speech is normal and behavior is normal. Thought content normal. Cognition and memory are normal. She expresses impulsivity.    Review of Systems  Neurological: Positive for weakness.  Psychiatric/Behavioral: Positive for depression.  All other systems reviewed and are negative.   Blood pressure (!) 123/56, pulse 66, temperature 98.2 F (36.8 C), temperature source Oral, resp. rate (!) 21, height 5\' 3"  (1.6 m), weight 65.5 kg, SpO2 98 %.Body mass index is 25.58 kg/m.  General Appearance: Fairly Groomed  Eye Contact:  Good  Speech:  Clear and Coherent  Volume:  Normal  Mood:  Depressed  Affect:  Appropriate  Thought Process:  Goal Directed and Descriptions of Associations: Intact  Orientation:  Full (Time, Place, and Person)  Thought Content:  WDL  Suicidal Thoughts:  No  Homicidal Thoughts:  No  Memory:  Immediate;   Fair Recent;   Fair Remote;   Fair  Judgement:  Impaired  Insight:  Shallow  Psychomotor Activity:  Decreased  Concentration:  Concentration: Fair and Attention Span: Fair  Recall:  [employer] of Knowledge:  Fair  Language:  Fair  Akathisia:  No  Handed:  Right  AIMS (if indicated):     Assets:  Communication Skills Desire for Improvement Financial Resources/Insurance Housing Physical Health Resilience Social Support  ADL's:  Intact  Cognition:  WNL  Sleep:  Number of Hours: 7.5     Treatment Plan Summary: Daily contact with patient to assess and evaluate symptoms and progress in treatment and Medication management   Ms. Croy is a 72 year old female with a history of bipolar disorder, recently discharged from psychiatry, who was readmitted immediately after discharge after Intentional overdose on medications when facing real or imaginary financial burden of dialysis. She was intubated and  admitted to ICU. She returns to psychiatry unable to ambulate, swallow and, psychtoic with paranoid delusions and hallucinations.   #Mood/psychosis, improving -continue Tegretol 200 mgTID -Haldol 2.5 mg before dialysis on M,W,Fri -continue Geodon to 60 mg BID  #HTN -Metoprolol 25 mg BID  #Dialysis -nephrology input is appreciated  #Deconditioning, improving -unable to stand or ambulate -PT  -SNF recommended after discharge  #Dysphagia -mechanical soft diet with thin liquids -speech pathology eval  #GERD -Pepcid 20 mg  #Restless legs -Requip 2 mg nightly  #Glaucoma -continue eye drops  #Disposition -patient will eventually return to live with her son -patient referred toSNFbut so far rejected by several facilities -follow up with [facility]   Orson Slick, MD 03/14/2018, 1:06 PM

## 2018-03-13 NOTE — Progress Notes (Signed)
In bed, eyes open. 1:1 sitter at bedside for safety.

## 2018-03-13 NOTE — Progress Notes (Signed)
Vet in room eating breakfast, remains with sitter 1:1 for safety.

## 2018-03-13 NOTE — Progress Notes (Signed)
Pre dialysis assessment 

## 2018-03-13 NOTE — B[facility] Group Notes (Signed)
[facility] LCSW Group Therapy  03/13/2018 2:44 PM  Type of Therapy:  Group Therapy  Participation Level:  Did Not Attend  Participation Quality:  Did not attend  Affect:  na  Cognitive:  na  Insight:  na  Engagement in Therapy:  na  Modes of Intervention:  na  Summary of Progress/Problems: Patient was at dialysis  Joana Reamer 03/13/2018, 2:44 PM

## 2018-03-13 NOTE — BHH Group Notes (Signed)
[place] LCSW Group Therapy  03/13/2018 2:41 PM  Type of Therapy:  Group Therapy  Participation Level:  Did Not Attend  Participation Quality:  Did not attend  Affect:  na  Cognitive:  na  Insight:  na  Engagement in Therapy:  na  Modes of Intervention:  na  Summary of Progress/Problems: Patient was at dialysis  Joana Reamer 03/13/2018, 2:41 PM

## 2018-03-13 NOTE — Plan of Care (Signed)
Patient with sad, flat affect. Medication compliant. Isolative to self and room. Eating meals in room. 1:1 Sitter for safety present. Patient currently having dialysis. Denies SI, HI, AVH. Able to eat meal independently. Pt uses w/c for ambulation. Quiet, minimal conversation or interaction with staff or peers.  Encouragement and support offered. Safety checks maintained. Pt receptive and remains safe on unit with q 15 min checks. Problem: Education: Goal: Emotional status will improve Outcome: Progressing Goal: Mental status will improve Outcome: Progressing   Problem: Health Behavior/Discharge Planning: Goal: Compliance with therapeutic regimen will improve Outcome: Progressing   Problem: Education: Goal: Knowledge of [facility] Education information/materials will improve Outcome: Not Progressing Goal: Verbalization of understanding the information provided will improve Outcome: Not Progressing   Problem: Education: Goal: Knowledge of the prescribed therapeutic regimen will improve Outcome: Not Progressing   Problem: Activity: Goal: Interest or engagement in leisure activities will improve Outcome: Not Progressing   Problem: Coping: Goal: Will verbalize feelings Outcome: Not Progressing

## 2018-03-13 NOTE — Progress Notes (Signed)
[facility]  ROUNDING NOTE   Subjective:  Patient was admitted to psychiatry after her inpatient stay here. We were notified late last evening that she was under psychiatric observation. Feeling well at the moment.    Objective:  Vital signs in last 24 hours:  Pulse Rate:  [80-83] 80 (08/24 0626) Resp:  [16] 16 (08/24 0626) BP: (97-131)/(43-55) 131/55 (08/24 0626) SpO2:  [97 %] 97 % (08/24 0626)  Weight change:  Filed Weights   03/11/18 0340  Weight: 67 kg    Intake/Output: I/O last 3 completed shifts: In: 1080 [P.O.:1080] Out: -    Intake/Output this shift:  No intake/output data recorded.  Physical Exam: General: No acute distress  Head: [place]/AT hearing intact  Eyes: Anicteric  Neck: Supple  Lungs:  Scattered rhonchi, normal effort  Heart: S1S2 no rubs  Abdomen:  Soft, nontender, BS present  Extremities: Trace peripheral edema.  Neurologic: Awake, alert, follows commands  Skin: No lesions  Access: Left AVF    Basic Metabolic Panel: Recent Labs  Lab 03/08/18 0453 03/09/18 0501 03/10/18 0340  NA 146* 140  --   K 3.8 3.6  --   CL 106 101  --   CO2 26 31  --   GLUCOSE 103* 93  --   BUN 70* 37*  --   CREATININE 5.42* 3.16*  --   CALCIUM 8.9 8.9  --   MG 2.9*  --   --   PHOS 4.6  --  3.1    Liver Function Tests: No results for input(s): AST, ALT, ALKPHOS, BILITOT, PROT, ALBUMIN in the last 168 hours. No results for input(s): LIPASE, AMYLASE in the last 168 hours. No results for input(s): AMMONIA in the last 168 hours.  CBC: Recent Labs  Lab 03/10/18 0340  WBC 9.2  HGB 9.6*  HCT 28.0*  MCV 97.7  PLT 236    Cardiac Enzymes: Recent Labs  Lab 03/08/18 0453  TROPONINI 0.06*    BNP: Invalid input(s): POCBNP  CBG: Recent Labs  Lab 03/09/18 1633 03/09/18 2018 03/09/18 2356 03/10/18 0414 03/10/18 0752  GLUCAP 99 119* 144* 107* 107*    Microbiology: Results for orders placed or performed during the hospital encounter of  03/04/18  Urine culture     Status: None   Collection Time: 03/04/18 11:03 AM  Result Value Ref Range Status   Specimen Description   Final    URINE, RANDOM Performed at [facility], [address] [place], [place] [address]    Special Requests   Final    NONE Performed at [facility], [address] [place], [place] [address]    Culture   Final    NO GROWTH Performed at [facility], [place] [address] [place], [place] [address]    Report Status 03/06/2018 FINAL  Final  Blood Cultures (routine x 2)     Status: Abnormal   Collection Time: 03/04/18 11:18 AM  Result Value Ref Range Status   Specimen Description   Final    BLOOD RIGHT ARM Performed at [facility], [place] [address], [place], [place] [address]    Special Requests   Final    BOTTLES DRAWN AEROBIC AND ANAEROBIC Blood Culture adequate volume Performed at [address] [place],  [address]    Culture  Setup Time   Final    GRAM POSITIVE COCCI AEROBIC BOTTLE ONLY CRITICAL RESULT CALLED TO, READ BACK BY AND VERIFIED WITH:  DAVID BESANTI ON 03/05/18 AT 0109 JAG    Culture (A)  Final    VIRIDANS STREPTOCOCCUS THE SIGNIFICANCE OF ISOLATING THIS ORGANISM FROM A SINGLE SET OF BLOOD CULTURES WHEN MULTIPLE SETS ARE DRAWN IS UNCERTAIN. PLEASE NOTIFY THE MICROBIOLOGY DEPARTMENT WITHIN ONE WEEK IF SPECIATION AND SENSITIVITIES ARE REQUIRED. Performed at [facility], [place] [address] [place], [place] [address]    Report Status 03/07/2018 FINAL  Final  Blood Culture ID Panel (Reflexed)     Status: Abnormal   Collection Time: 03/04/18 11:18 AM  Result Value Ref Range Status   Enterococcus species NOT DETECTED NOT DETECTED Final   Listeria monocytogenes NOT DETECTED NOT DETECTED Final   Staphylococcus species NOT DETECTED NOT DETECTED Final   Staphylococcus aureus NOT DETECTED NOT DETECTED Final   Streptococcus species DETECTED (A) NOT DETECTED Final     Comment: Not Enterococcus species, Streptococcus agalactiae, Streptococcus pyogenes, or Streptococcus pneumoniae. CRITICAL RESULT CALLED TO, READ BACK BY AND VERIFIED WITH: DAVID BESANTI ON 03/05/18 AT 0109 BY JAG    Streptococcus agalactiae NOT DETECTED NOT DETECTED Final   Streptococcus pneumoniae NOT DETECTED NOT DETECTED Final   Streptococcus pyogenes NOT DETECTED NOT DETECTED Final   Acinetobacter baumannii NOT DETECTED NOT DETECTED Final   Enterobacteriaceae species NOT DETECTED NOT DETECTED Final   Enterobacter cloacae complex NOT DETECTED NOT DETECTED Final   Escherichia coli NOT DETECTED NOT DETECTED Final   Klebsiella oxytoca NOT DETECTED NOT DETECTED Final   Klebsiella pneumoniae NOT DETECTED NOT DETECTED Final   Proteus species NOT DETECTED NOT DETECTED Final   Serratia marcescens NOT DETECTED NOT DETECTED Final   Haemophilus influenzae NOT DETECTED NOT DETECTED Final   Neisseria meningitidis NOT DETECTED NOT DETECTED Final   Pseudomonas aeruginosa NOT DETECTED NOT DETECTED Final   Candida albicans NOT DETECTED NOT DETECTED Final   Candida glabrata NOT DETECTED NOT DETECTED Final   Candida krusei NOT DETECTED NOT DETECTED Final   Candida parapsilosis NOT DETECTED NOT DETECTED Final   Candida tropicalis NOT DETECTED NOT DETECTED Final    Comment: Performed at [facility], [address] [place], [place] [address]  MRSA PCR Screening     Status: None   Collection Time: 03/04/18 12:37 PM  Result Value Ref Range Status   MRSA by PCR NEGATIVE NEGATIVE Final    Comment:        The GeneXpert MRSA Assay (FDA approved for NASAL specimens only), is one component of a comprehensive MRSA colonization surveillance program. It is not intended to diagnose MRSA infection nor to guide or monitor treatment for MRSA infections. Performed at [facility], [address] [place], [place] [address]   Culture, blood (Routine X 2) w Reflex to ID Panel     Status:  None   Collection Time: 03/05/18  2:02 AM  Result Value Ref Range Status   Specimen Description BLOOD RIGHT HAND  Final   Special Requests   Final    BOTTLES DRAWN AEROBIC ONLY Blood Culture results may not be optimal due to an inadequate volume of blood received in culture bottles   Culture   Final    NO GROWTH 5 DAYS Performed at [facility], [address] [place], [place] [address]    Report Status 03/10/2018 FINAL  Final  Culture, blood (Routine X 2) w Reflex to ID Panel     Status: None   Collection Time: 03/05/18  2:03 AM  Result Value Ref Range Status   Specimen  Description BLOOD RIGHT HAND  Final   Special Requests   Final    BOTTLES DRAWN AEROBIC ONLY Blood Culture results may not be optimal due to an inadequate volume of blood received in culture bottles   Culture   Final    NO GROWTH 5 DAYS Performed at [facility], [address] [place], [place] [address]    Report Status 03/10/2018 FINAL  Final    Coagulation Studies: No results for input(s): LABPROT, INR in the last 72 hours.  Urinalysis: No results for input(s): COLORURINE, LABSPEC, PHURINE, GLUCOSEU, HGBUR, BILIRUBINUR, KETONESUR, PROTEINUR, UROBILINOGEN, NITRITE, LEUKOCYTESUR in the last 72 hours.  Invalid input(s): APPERANCEUR    Imaging: No results found.   Medications:    . brimonidine  1 drop Both Eyes BID  . carbamazepine  200 mg Oral TID  . dicyclomine  20 mg Oral TID AC  . famotidine  20 mg Oral Daily  . feeding supplement (NEPRO CARB STEADY)  237 mL Oral BID BM  . haloperidol  2.5 mg Oral Q M,W,F  . metoprolol tartrate  25 mg Oral BID  . multivitamin  1 tablet Oral QHS  . rOPINIRole  2 mg Oral Daily  . timolol  1 drop Both Eyes BID  . tuberculin  5 Units Intradermal Once  . vitamin C  250 mg Oral BID  . ziprasidone  60 mg Oral BID WC   acetaminophen, alum & mag hydroxide-simeth, magnesium hydroxide, pentafluoroprop-tetrafluoroeth  Assessment/ Plan:  [name] is a 72 y.o. white female with end stage renal disease on hemodialysis, hypertension, bipolar disorder, GERD, polycystic kidney disease  [facility] MWF left AVF  1. End Stage Renal Disease  -We were notified late yesterday evening of patient's psychiatric admission.  We will plan for dialysis this AM.   2.  Recent suicidie attempt: Patient under psychiatric observation at the moment.  3. Anemia of chronic kidney disease: Restart Epogen next week.  4.  Secondary hyperparathyroidism.  Check serum phosphorus today.       LOS: 2 Joyce [name] 8/24/20198:28 AM

## 2018-03-13 NOTE — Progress Notes (Signed)
Presently visiting with family in craft room. In no distress eating meal.

## 2018-03-13 NOTE — Progress Notes (Signed)
Hd started  

## 2018-03-13 NOTE — Progress Notes (Signed)
Patient returned from dialysis, currently in room eating lunch, asking about medication. Patient alert and oriented. Denies SI, HI, AVH. continues to eat meals in room. Logical thus far throughout shift. Encouragement and support offered. Remains with sitter 1:1 for safety. Remains safe on unit with q 15 min checks and sitter at bedside.

## 2018-03-14 NOTE — Plan of Care (Signed)
Patient verbalizes understanding of the general information that's been provided to her and all questions/concerns have been addressed and answered at this time. Patient denies SI/HI/AVH stating to this writer "I'm trying to distinguish between what's real and what's hallucinations". Patient states that she is feeling "better" today. Patient verbalizes understanding of and has been in compliance with her prescribed therapeutic regimen and all questions/concerns have been addressed and answered at this time. Patient denies depression stating to this writing "I'm thankful to be alive", and she states that she's anxious because "I want to get things done right now". Patient has the ability to make decisions for herself and her goal is to "walk more with my legs" in which she has been observed walking around the unit with her assigned safety sitter. Patient remains safe on the unit at this time.   Problem: Education: Goal: Knowledge of [facility] Education information/materials will improve Outcome: Progressing Goal: Emotional status will improve Outcome: Progressing Goal: Mental status will improve Outcome: Progressing Goal: Verbalization of understanding the information provided will improve Outcome: Progressing   Problem: Education: Goal: Utilization of techniques to improve thought processes will improve Outcome: Progressing Goal: Knowledge of the prescribed therapeutic regimen will improve Outcome: Progressing   Problem: Activity: Goal: Interest or engagement in leisure activities will improve Outcome: Progressing   Problem: Coping: Goal: Coping ability will improve Outcome: Progressing Goal: Will verbalize feelings Outcome: Progressing   Problem: Health Behavior/Discharge Planning: Goal: Compliance with therapeutic regimen will improve Outcome: Progressing   Problem: Education: Goal: Ability to make informed decisions regarding treatment will improve Outcome:  Progressing   Problem: Self-Concept: Goal: Ability to disclose and discuss suicidal ideas will improve Outcome: Progressing Goal: Will verbalize positive feelings about self Outcome: Progressing

## 2018-03-14 NOTE — Progress Notes (Signed)
Patient has 1:1 sitter for safety at bedside resting in bed quietly, no issues or distress to report at this time.

## 2018-03-14 NOTE — Progress Notes (Signed)
D- Patient alert and oriented. Patient presents in a pleasant mood on assessment stating that "she told me I slept, but I moved around a lot, I guess that's why I'm feeling tired this morning". Patient stated to this writer that she's trying to "distinguish between what's real and what's hallucinations". Patient states that she's anxious because "I want to get things done right now". Patient denies SI, HI, AVH, and pain at this time. Patient also denies any depression stating "I'm thankful to be alive". Patient's goal for today is to "walk more with my legs".  A- Scheduled medications administered to patient, per MD orders. Support and encouragement provided.  Routine safety checks conducted every 15 minutes.  Patient informed to notify staff with problems or concerns.  R- No adverse drug reactions noted. Patient contracts for safety at this time. Patient compliant with medications and treatment plan. Patient receptive, calm, and cooperative. Patient interacts well with others on the unit.  Patient remains safe at this time.

## 2018-03-14 NOTE — Plan of Care (Signed)
Pt. More logical and coherent this evening. Pt. Mood is pleasant and cooperative with assessments. Pt. Denies SI/HI and verbally is able to contract for safety. Pt. Reports doing, "good" this evening. Pt. Is complaint with medications.    Problem: Education: Goal: Emotional status will improve Outcome: Progressing Goal: Mental status will improve Outcome: Progressing   Problem: Health Behavior/Discharge Planning: Goal: Compliance with therapeutic regimen will improve Outcome: Progressing   Problem: Self-Concept: Goal: Ability to disclose and discuss suicidal ideas will improve Outcome: Progressing

## 2018-03-14 NOTE — Progress Notes (Addendum)
[facility] MD Progress Note  03/15/2018 10:01 AM Joyce [name]  MRN:  [id number]  Subjective:  [name] has been doing better. She is no longer suicidal. She accepted dialysis on Saturday with no problems. He mood is improving, affects is brighter. Still has 1:1 sitter as she is not able to ambulate independently. She slept well, ate her breakfast in dining area. She accepts medications and tolerates them well. She expects a visit from her son and daughter in law, with whom she will reside, this afternoon. The family is, understandably, shaken by her recent suicide attempt. We will have to develops a solid discharge plan for this patient with highly unpredictable behavior to address her physical needs: deconditioning and dialysis as well as safety issues.  We were so far, unable to identify a SNF ready to accommodate this patient's needs.   Spoke with PT department. The patient will be reassessed today.     Principal Problem: Bipolar I disorder, current or most recent episode depressed, with psychotic features [facility]) Diagnosis:   Patient Active Problem List   Diagnosis Date Noted  . Bipolar I disorder, current or most recent episode depressed, with psychotic features ([place]) [F31.5] 02/26/2018    Priority: High  . Bipolar affective disorder, current episode depressed with psychotic symptoms ([place]) [F31.2] 09/17/2017    Priority: High  . Bipolar I disorder, most recent episode (or current) manic ([place]) [F31.10] 09/02/2017    Priority: High  . Suicide attempt ([place]) [T14.91XA] 03/09/2018  . Acute respiratory failure with hypoxia ([place]) [J96.01] 03/04/2018  . UTI (urinary tract infection) [N39.0] 12/16/2017  . Palliative care encounter [Z51.5]   . Acute delirium [R41.0] 09/02/2017  . Altered mental status [R41.82]   . Acute encephalopathy [G93.40] 09/01/2017  . ESRD on dialysis ([place]) [N18.6, Z99.2] 04/06/2017  . Complication of vascular access for dialysis [T82.9XXA] 04/06/2017  . Hematuria  [R31.9] 12/18/2016  . Polycystic kidney [Q61.3] 10/12/2016  . Ruptured cyst of kidney [Q61.00] 10/03/2016  . HTN (hypertension) [I10] 10/01/2016  . Chronic kidney disease [N18.9] 10/01/2016  . Anemia associated with chronic renal failure [N18.9, D63.1] 09/24/2016  . Acute kidney insufficiency [N28.9] 07/28/2016  . Noninfectious diarrhea [K52.9]   . Benign neoplasm of cecum [D12.0]   . Benign neoplasm of ascending colon [D12.2]   . Diarrhea [R19.7]   . Nausea [R11.0]   . Gastric polyp [K31.7]   . Chronic constipation [K59.09] 12/27/2014  . Nausea with vomiting [R11.2] 12/27/2014  . Left sided abdominal pain [R10.9] 12/27/2014  . Personal history of colonic polyps [Z86.010] 05/10/2013   Total Time spent with patient: 20 minutes  Past Psychiatric History: bipolar disorder  Past Medical History:  Past Medical History:  Diagnosis Date  . Anal fissure   . Bipolar affective disorder ([place])   . CKD (chronic kidney disease)    Dr Holley Raring [name] 4  . Colon polyps   . Diverticulitis   . Diverticulitis   . Family history of adverse reaction to anesthesia    mom - PONV  . GERD (gastroesophageal reflux disease)   . Headache    migraines - none over 10 yrs  . Heart murmur   . History of hiatal hernia   . Hypertension   . Pancreatitis    Valproic acid  . Vertigo     Past Surgical History:  Procedure Laterality Date  . A/V FISTULAGRAM Left 04/14/2017   Procedure: A/V Fistulagram;  Surgeon: Katha Cabal, MD;  Location: [place] [facility];  Service: Cardiovascular;  Laterality: Left;  . A/V FISTULAGRAM Left 06/09/2017   Procedure: A/V FISTULAGRAM;  Surgeon: Katha Cabal, MD;  Location: [place] [facility];  Service: Cardiovascular;  Laterality: Left;  . A/V FISTULAGRAM Left 11/02/2017   Procedure: A/V FISTULAGRAM;  Surgeon: Algernon Huxley, MD;  Location: [place] [facility];  Service: Cardiovascular;  Laterality: Left;  . A/V SHUNT INTERVENTION N/A 11/02/2017   Procedure:  A/V SHUNT INTERVENTION;  Surgeon: Algernon Huxley, MD;  Location: [place] [facility];  Service: Cardiovascular;  Laterality: N/A;  . ABDOMINAL HYSTERECTOMY  1990 ?  . AV FISTULA PLACEMENT  4/30  . BREAST EXCISIONAL BIOPSY Left 1994   neg surgical bx  . CHOLECYSTECTOMY  2003  . COLONOSCOPY  2014   Dr. Jamal Collin  . COLONOSCOPY WITH PROPOFOL N/A 09/24/2015   Procedure: COLONOSCOPY WITH random colon byopies.;  Surgeon: Lucilla Lame, MD;  Location: [place];  Service: Endoscopy;  Laterality: N/A;  . ESOPHAGOGASTRODUODENOSCOPY (EGD) WITH PROPOFOL N/A 09/24/2015   Procedure: ESOPHAGOGASTRODUODENOSCOPY (EGD) ;  Surgeon: Lucilla Lame, MD;  Location: [place];  Service: Endoscopy;  Laterality: N/A;  . EYE SURGERY    . PERIPHERAL VASCULAR CATHETERIZATION N/A 05/29/2015   Procedure: A/V Shuntogram/Fistulagram;  Surgeon: Katha Cabal, MD;  Location: [place] [facility];  Service: Cardiovascular;  Laterality: N/A;  . PERIPHERAL VASCULAR CATHETERIZATION N/A 05/29/2015   Procedure: A/V Shunt Intervention;  Surgeon: Katha Cabal, MD;  Location: [place] [facility];  Service: Cardiovascular;  Laterality: N/A;  . POLYPECTOMY  09/24/2015   Procedure: POLYPECTOMY INTESTINAL;  Surgeon: Lucilla Lame, MD;  Location: [place];  Service: Endoscopy;;  cecal polyp ascending polyp   Family History:  Family History  Problem Relation Age of Onset  . Stroke Father   . Hypertension Father   . Breast cancer Other   . Colon cancer Neg Hx   . Liver disease Neg Hx    Family Psychiatric  History: none Social History:  Social History   Substance and Sexual Activity  Alcohol Use No  . Alcohol/week: 0.0 standard drinks     Social History   Substance and Sexual Activity  Drug Use No    Social History   Socioeconomic History  . Marital status: Single    Spouse name: Not on file  . Number of children: 1  . Years of education: Not on file  . Highest education level: Not on file   Occupational History  . Occupation: Surveyor, quantity: [employer]  Social Needs  . Financial resource strain: Not on file  . Food insecurity:    Worry: Not on file    Inability: Not on file  . Transportation needs:    Medical: Not on file    Non-medical: Not on file  Tobacco Use  . Smoking status: Never Smoker  . Smokeless tobacco: Never Used  Substance and Sexual Activity  . Alcohol use: No    Alcohol/week: 0.0 standard drinks  . Drug use: No  . Sexual activity: Not Currently  Lifestyle  . Physical activity:    Days per week: Not on file    Minutes per session: Not on file  . Stress: Not on file  Relationships  . Social connections:    Talks on phone: Not on file    Gets together: Not on file    Attends religious service: Not on file    Active member of club or organization: Not on file  Attends meetings of clubs or organizations: Not on file    Relationship status: Not on file  Other Topics Concern  . 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   Additional Social History:                         Sleep: Fair  Appetite:  Fair  Current Medications: Current Facility-Administered Medications  Medication Dose Route Frequency Provider Last Rate Last Dose  . acetaminophen (TYLENOL) tablet 650 mg  650 mg Oral Q6H PRN Clapacs, [name], MD      . alum & mag hydroxide-simeth (MAALOX/MYLANTA) 200-200-20 MG/5ML suspension 30 mL  30 mL Oral Q4H PRN Clapacs, [name], MD   30 mL at 03/14/18 1733  . brimonidine (ALPHAGAN) 0.2 % ophthalmic solution 1 drop  1 drop Both Eyes BID Clapacs, [name], MD   1 drop at 03/15/18 0900  . carbamazepine (TEGRETOL) tablet 200 mg  200 mg Oral TID Clapacs, [name], MD   200 mg at 03/14/18 1735  . dicyclomine (BENTYL) tablet 20 mg  20 mg Oral TID AC Clapacs, [name], MD   20 mg at 03/14/18 1735  . famotidine (PEPCID) tablet 20 mg  20 mg Oral Daily  Clapacs, [name], MD   20 mg at 03/14/18 0841  . feeding supplement (NEPRO CARB STEADY) liquid 237 mL  237 mL Oral BID BM Clapacs, [name], MD   237 mL at 03/14/18 1500  . haloperidol (HALDOL) tablet 2.5 mg  2.5 mg Oral Q M,W,F Clapacs, [name], MD   2.5 mg at 03/12/18 1140  . magnesium hydroxide (MILK OF MAGNESIA) suspension 30 mL  30 mL Oral Daily PRN Clapacs, [name], MD      . metoprolol tartrate (LOPRESSOR) tablet 25 mg  25 mg Oral BID Clapacs, [name], MD   25 mg at 03/13/18 1606  . multivitamin (RENA-VIT) tablet 1 tablet  1 tablet Oral QHS Rasheed Welty B, MD   1 tablet at 03/14/18 2101  . pentafluoroprop-tetrafluoroeth (GEBAUERS) aerosol   Topical PRN Clapacs, [name], MD      . rOPINIRole (REQUIP) tablet 2 mg  2 mg Oral Daily Clapacs, [name], MD   2 mg at 03/14/18 0841  . timolol (TIMOPTIC) 0.5 % ophthalmic solution 1 drop  1 drop Both Eyes BID Clapacs, [name], MD   1 drop at 03/15/18 0859  . vitamin C (ASCORBIC ACID) tablet 250 mg  250 mg Oral BID Kimetha Trulson B, MD   250 mg at 03/14/18 2101  . ziprasidone (GEODON) capsule 60 mg  60 mg Oral BID WC Noeli Lavery B, MD   60 mg at 03/14/18 1735    Lab Results:  Results for orders placed or performed during the hospital encounter of 03/11/18 (from the past 48 hour(s))  Phosphorus     Status: None   Collection Time: 03/13/18 10:39 AM  Result Value Ref Range   Phosphorus 4.5 2.5 - 4.6 mg/dL    Comment: Performed at [facility], [address], [place], [place] [address]    Blood Alcohol level:  Lab Results  Component Value Date   [facility] <10 03/04/2018   ETH <10 07/23/7251    Metabolic Disorder Labs: Lab Results  Component Value Date   HGBA1C 4.8 09/18/2017   MPG 91.06 09/18/2017   No results  found for: PROLACTIN Lab Results  Component Value Date   CHOL 251 (H) 09/18/2017   TRIG 124 09/18/2017   HDL 66 09/18/2017   CHOLHDL 3.8 09/18/2017   VLDL 25 09/18/2017   LDLCALC 160 (H) 09/18/2017   LDLCALC 43  05/14/2014    Physical Findings: AIMS:  , ,  ,  ,    CIWA:    COWS:     Musculoskeletal: Strength & Muscle Tone: within normal limits Gait & Station: normal Patient leans: N/A  Psychiatric Specialty Exam: Physical Exam  Nursing note and vitals reviewed. Psychiatric: She has a normal mood and affect. Her speech is normal and behavior is normal. Thought content normal. Cognition and memory are normal. She expresses impulsivity.    Review of Systems  Neurological: Negative.   Psychiatric/Behavioral: Positive for depression.  All other systems reviewed and are negative.   Blood pressure (!) 141/65, pulse 78, temperature 98.4 F (36.9 C), temperature source Oral, resp. rate 16, height 5\' 3"  (1.6 m), weight 65.5 kg, SpO2 96 %.Body mass index is 25.58 kg/m.  General Appearance: Casual  Eye Contact:  Good  Speech:  Clear and Coherent  Volume:  Normal  Mood:  Depressed  Affect:  Appropriate  Thought Process:  Goal Directed and Descriptions of Associations: Intact  Orientation:  Full (Time, Place, and Person)  Thought Content:  WDL  Suicidal Thoughts:  No  Homicidal Thoughts:  No  Memory:  Immediate;   Fair Recent;   Fair Remote;   Fair  Judgement:  Impaired  Insight:  Shallow  Psychomotor Activity:  Normal  Concentration:  Concentration: Fair and Attention Span: Fair  Recall:  [employer] of Knowledge:  Fair  Language:  Fair  Akathisia:  No  Handed:  Right  AIMS (if indicated):     Assets:  Communication Skills Desire for Improvement Financial Resources/Insurance Housing Resilience Social Support  ADL's:  Intact  Cognition:  WNL  Sleep:  Number of Hours: 7.15     Treatment Plan Summary: Daily contact with patient to assess and evaluate symptoms and progress in treatment and Medication management   Ms. Kist is a 72 year old female with a history of bipolar disorder, recently discharged from psychiatry, who was readmitted immediately after discharge after  Intentional overdose on medications when facing real or imaginary financial burden of dialysis. She was intubated and admitted to ICU. She returns to psychiatry unable to ambulate, swallow and, psychtoic with paranoid delusions and hallucinations.   #Mood/psychosis, improving -continue Tegretol 200 mgTID -Haldol 2.5 mg before dialysis on M,W,Fri -continue Geodon to 60 mg BID  #HTN -Metoprolol 25 mg BID  #Dialysis -nephrology input is appreciated  #Deconditioning, improving -difficulties ambulating, unsteady gait -PT recommends SNF after discharge  #Dysphagia, resolved -mechanical soft diet with thin liquids -speech pathology eval  #GERD -Pepcid 20 mg  #Restless legs -Requip 2 mg nightly  #Glaucoma -continue eye drops  #Disposition -patient will eventually return to live with her son -patient referred toSNFbut so far rejected by several facilities -follow up TBE   Orson Slick, MD 03/15/2018, 10:01 AM

## 2018-03-14 NOTE — BHH Group Notes (Signed)
[facility] LCSW Group Therapy  8/252019 1 pm  Type of Therapy:  Group Therapy:Open discussion and Promoting self care  Participation Level:  Did not attend  Imari Reen LCSW [phone]

## 2018-03-14 NOTE — Plan of Care (Signed)
Patient had no behavioral issues to report on shift  thus far. 1:1 sitter remains at the bedside for safety.

## 2018-03-14 NOTE — Progress Notes (Signed)
This Probation officer did not give patient her prescribed blood pressure medication because it was low this morning. This Probation officer encouraged patient to continue drinking the Gatorade that was provided to her.

## 2018-03-14 NOTE — Progress Notes (Signed)
D: Pt denies SI/HI/AVH. Pt is pleasant and cooperative this evening with assessments. Pt. Has no Complaints, denies nausea this evening.  Patient Interaction appropriate. Pt. Is isolative and withdrawn this evening, but reports improved mood and well-being. Pt. Dialysis fistula WDL. Pt. Denies pain. Pt. Denies depression and anxiety this evening. Pt. Reports she got good exercise during the day walking around the unit. Pt. Presents with improved mental clarity. Pt. Does not attend group or snack this evening. Pt. Engages well during assessments.     A: Q x 15 minute observation checks were completed for safety. Patient was provided with education, but needs reinforcement.  Patient was given/offered medications per orders. Patient  was encourage to attend groups, participate in unit activities and continue with plan of care. Pt. Chart and plans of care reviewed. Pt. Given support and encouragement.   R: Patient is complaint with medication and unit procedures. Pt. Given extensive education on falls risk safety. Pt. Monitored with 1:1 sitter per MD orders.             Precautionary checks every 15 minutes for safety maintained, room free of safety hazards, patient sustains no injury or falls during this shift. Will endorse care to next shift.

## 2018-03-14 NOTE — Progress Notes (Signed)
1:1 Patient Hourly Rounding  0800: Patient is in her room eating breakfast with her assigned safety sitter present.  0900: Patient is in her room, laying down, talking with her assigned Air cabin crew.  1000: Patient is in her room with her assigned safety sitter present.  1100: Patient is sitting up in bed, with her assigned safety sitter present.  1200: Patient is in her room eating lunch with her assigned safety sitter present.  1300: Patient is laying down in bed talking with this Probation officer.  1400: Patient is asleep in her room with this writer present by her side.  1500: Patient is outside with the other members on the unit as well as her assigned safety sitter present.  1600: Patient is visiting with family members in the craft room with her safety sitter present.  1700: Patient is still visiting with family in the craft room with her safety sitter present.  1800: Patient is in her room, laying down in bed, with her safety sitter present.   1900: Patient is in the room with her assigned safety sitter present.

## 2018-03-14 NOTE — BHH Group Notes (Signed)
[facility] Group Notes:  (Nursing/MHT/Case Management/Adjunct)  Date:  03/14/2018  Time:  9:16 PM  Type of Therapy:  Group Therapy  Participation Level:  Did Not Attend   [name] 03/14/2018, 9:16 PM

## 2018-03-14 NOTE — Progress Notes (Signed)
1:1 observation Notes  1900 Pt. Observed resting in her room with no distress noted. Pt. With 1:1 present for safety.  2000 Pt. Observed resting in her room with no distress noted. Pt. With 1:1 present for safety.  2100 Pt. Observed resting in her room with no distress noted. Pt. With 1:1 present for safety.  2200 Pt. Observed resting in her room with no distress noted. Pt. With 1:1 present for safety.  2300 Pt. Observed resting in her room with no distress noted. Pt. With 1:1 present for safety.  0000 Pt. Observed resting in her room with no distress noted. Pt. With 1:1 present for safety.  0100 Pt. Observed resting in her room with no distress noted. Pt. With 1:1 present for safety.  0200 Pt. Observed resting in her room with no distress noted. Pt. With 1:1 present for safety.  0300 Pt. Observed resting in her room with no distress noted. Pt. With 1:1 present for safety.  0400 Pt. Observed resting in her room with no distress noted. Pt. With 1:1 present for safety. 0500 Pt. Observed resting in her room with no distress noted. Pt. With 1:1 present for safety. 0600 Pt. Observed resting in her room with no distress noted. Pt. With 1:1 present for safety. 0700 Pt. Observed resting in her room with no distress noted. Pt. With 1:1 present for safety.   Safety precautions implemented per MD orders. Will endorse care to next shift.

## 2018-03-15 LAB — RENAL FUNCTION PANEL
ANION GAP: 10 (ref 5–15)
Albumin: 3.1 g/dL — ABNORMAL LOW (ref 3.5–5.0)
BUN: 50 mg/dL — ABNORMAL HIGH (ref 8–23)
CO2: 27 mmol/L (ref 22–32)
Calcium: 8.8 mg/dL — ABNORMAL LOW (ref 8.9–10.3)
Chloride: 95 mmol/L — ABNORMAL LOW (ref 98–111)
Creatinine, Ser: 5.22 mg/dL — ABNORMAL HIGH (ref 0.44–1.00)
GFR calc non Af Amer: 8 mL/min — ABNORMAL LOW (ref >60)
GFR, EST AFRICAN AMERICAN: 9 mL/min — AB (ref >60)
Glucose, Bld: 116 mg/dL — ABNORMAL HIGH (ref 70–99)
PHOSPHORUS: 3.3 mg/dL (ref 2.5–4.6)
POTASSIUM: 4.1 mmol/L (ref 3.5–5.1)
SODIUM: 132 mmol/L — AB (ref 135–145)

## 2018-03-15 LAB — CBC
HEMATOCRIT: 24.9 % — AB (ref 35.0–47.0)
HEMOGLOBIN: 8.7 g/dL — AB (ref 12.0–16.0)
MCH: 34 pg (ref 26.0–34.0)
MCHC: 34.9 g/dL (ref 32.0–36.0)
MCV: 97.5 fL (ref 80.0–100.0)
Platelets: 259 10*3/uL (ref 150–440)
RBC: 2.55 MIL/uL — AB (ref 3.80–5.20)
RDW: 13.5 % (ref 11.5–14.5)
WBC: 9.2 10*3/uL (ref 3.6–11.0)

## 2018-03-15 MED ORDER — EPOETIN ALFA 10000 UNIT/ML IJ SOLN
10000.0000 [IU] | Freq: Once | INTRAMUSCULAR | Status: AC
  Administered 2018-03-15: 10000 [IU] via INTRAVENOUS
  Filled 2018-03-15: qty 1

## 2018-03-15 NOTE — Progress Notes (Signed)
Post Dialysis Treatment    03/15/18 1445  Vital Signs  Temp 97.7 F (36.5 C)  Temp Source Oral  Pulse Rate 80  Pulse Rate Source Monitor  Resp 16  BP (!) 141/57  BP Location Right Arm  BP Method Automatic  Patient Position (if appropriate) Lying  Oxygen Therapy  SpO2 99 %  O2 Device Room Air  Pain Assessment  Pain Scale 0-10  Pain Score 0  Dialysis Weight  Weight 66 kg  Type of Weight Post-Dialysis  Post-Hemodialysis Assessment  Rinseback Volume (mL) 250 mL  KECN 74.3 V  Dialyzer Clearance Lightly streaked  Duration of HD Treatment -hour(s) 3.5 hour(s)  Hemodialysis Intake (mL) 500 mL  UF Total -Machine (mL) 2028 mL  Net UF (mL) 1528 mL  Tolerated HD Treatment Yes  AVG/AVF Arterial Site Held (minutes) 15 minutes  AVG/AVF Venous Site Held (minutes) 10 minutes  Fistula / Graft Left Upper arm Arteriovenous fistula  Placement Date: 08/13/15   Placed prior to admission: Yes  Orientation: Left  Access Location: Upper arm  Access Type: Arteriovenous fistula  Site Condition No complications  Fistula / Graft Assessment Present;Thrill;Bruit  Drainage Description None

## 2018-03-15 NOTE — Progress Notes (Signed)
1:1 Patient Hourly Rounding  0800: Patient is in the dayroom eating breakfast with her assigned safety sitter present.  0900: Patient is in her room, laying in bed, with her assigned safety sitter present.  1000: Patient is in the craft room for group therapy with her assigned safety sitter present.  1100: Patient is off the unit at dialysis with her assigned safety sitter present.  1200: Patient is off the unit at dialysis with her assigned sitter present.  1300: Patient is off the unit at dialysis with her assigned safety sitter present.  1400: Patient is off the unit at dialysis with her assigned safety sitter present.  1500: Patient is in the community room eating her lunch with her assigned safety sitter present.  1600: Patient is in her room with her assigned safety sitter present.  1700: Patient is eating dinner in her room with her safety sitter present.  1800: Patient is in her room talking with her assigned Air cabin crew.  1900: Patient is in her room, laying down, with her assigned safety sitter present.

## 2018-03-15 NOTE — Progress Notes (Addendum)
Pre HD Assessment    03/15/18 1038  Neurological  Level of Consciousness Alert  Orientation Level Oriented to person;Oriented to place;Oriented to situation  Respiratory  Respiratory Pattern Regular;Unlabored;Symmetrical  Chest Assessment Chest expansion symmetrical  Cardiac  Pulse Regular  ECG Monitor Yes  Cardiac Rhythm SB  Antiarrhythmic device No  Vascular  R Radial Pulse +2  L Radial Pulse +2  Integumentary  Integumentary (WDL) WDL  Musculoskeletal  Musculoskeletal (WDL) WDL  Assistive Device Wheelchair  GU Assessment  Genitourinary (WDL) X (HD pt)  Psychosocial  Psychosocial (WDL) WDL

## 2018-03-15 NOTE — Care Management (Signed)
RNCM received call from Taffy with [facility] Case [facility] [phone] requesting updated to outpatient hemodialysis chair. Per Estill Bamberg with [facility] [phone] patient will have a chair on T, TH, Sat either at [facility] or [facility]. RNCM advised Taffy to reach out to [facility] with [facility].

## 2018-03-15 NOTE — BHH Group Notes (Signed)
[facility] Group Notes:  (Nursing/MHT/Case Management/Adjunct)  Date:  03/15/2018  Time:  9:14 PM  Type of Therapy:  Group Therapy  Participation Level:  Did Not Attend   Barnie Mort 03/15/2018, 9:14 PM

## 2018-03-15 NOTE — Progress Notes (Signed)
Pre HD Treatment    03/15/18 1036  Vital Signs  Temp 98.2 F (36.8 C)  Temp Source Oral  Pulse Rate 63  Pulse Rate Source Monitor  Resp 16  BP (!) 129/49  BP Location Right Arm  BP Method Automatic  Patient Position (if appropriate) Lying  Oxygen Therapy  SpO2 98 %  O2 Device Room Air  Pain Assessment  Pain Scale 0-10  Pain Score 0  Time-Out for Hemodialysis  What Procedure? HD  Pt Identifiers(min of two) First/Last Name;MRN/Account#;Pt's DOB(use if MRN/Acct# not available  Correct Site? Yes  Correct Side? Yes  Correct Procedure? Yes  Consents Verified? Yes  Rad Studies Available? N/A  Safety Precautions Reviewed? Yes  Engineer, civil (consulting) Number [id number]  Station Number 1  UF/Alarm Test Passed  Conductivity: Meter 14  Conductivity: Machine  14  pH 7.3  Reverse Osmosis Main  Normal Saline Lot Number [id number]  Dialyzer Lot Number [id number]  Disposable Set Lot Number [id number]  Machine Temperature 98.6 F (37 C)  Musician and Audible Yes  Blood Lines Intact and Secured Yes  Pre Treatment Patient Checks  Vascular access used during treatment Fistula  Patient is receiving dialysis in a chair Yes  Hepatitis B Surface Antigen Results Negative  Date Hepatitis B Surface Antigen Drawn 01/06/18  Hepatitis B Surface Antibody 17  Date Hepatitis B Surface Antibody Drawn 02/24/18  Hemodialysis Consent Verified Yes  Hemodialysis Standing Orders Initiated Yes  ECG (Telemetry) Monitor On Yes  Prime Ordered Normal Saline  Length of  DialysisTreatment -hour(s) 3.5 Hour(s)  Dialyzer Elisio 17H NR  Dialysate 2K, 2.5 Ca  Dialysis Anticoagulant None  Dialysate Flow Ordered 800  Blood Flow Rate Ordered 400 mL/min  Ultrafiltration Goal 1.5 Liters  Pre Treatment Labs Phosphorus  Dialysis Blood Pressure Support Ordered Normal Saline  Education / Care Plan  Dialysis Education Provided Yes  Documented Education in Care Plan Yes  Fistula / Graft Left Upper arm Arteriovenous  fistula  Placement Date: 08/13/15   Placed prior to admission: Yes  Orientation: Left  Access Location: Upper arm  Access Type: Arteriovenous fistula  Site Condition No complications  Fistula / Graft Assessment Present;Thrill;Bruit  Drainage Description None

## 2018-03-15 NOTE — Progress Notes (Signed)
Recreation Therapy Notes  Date: 03/15/2018  Time: 9:30 am  Location: Craft Room  Behavioral response: Appropriate  Intervention Topic: Problem Solving  Discussion/Intervention:  Group content on today was focused on problem solving. The group described what problem solving is. Patients expressed how problems affect them and how they deal with problems. Individuals identified healthy ways to deal with problems. Patients explained what normally happens to them when they do not deal with problems. The group expressed reoccurring problems for them. The group participated in the intervention "Ways to Solve problems" where patients were given a chance to explore different ways to solve problems.  Clinical Observations/Feedback:  Patient attended group and explained that problem solving is just a solution to what is bothering you.Individual was social with peers and staff while participating in group. Cloyce Paterson LRT/CTRS         Daved Mcfann 03/15/2018 12:48 PM

## 2018-03-15 NOTE — Progress Notes (Signed)
Post HD Assessment    03/15/18 1445  Neurological  Level of Consciousness Alert  Orientation Level Oriented to person;Oriented to place;Oriented to situation  Respiratory  Respiratory Pattern Regular;Unlabored;Symmetrical  Chest Assessment Chest expansion symmetrical  Cardiac  Pulse Regular  ECG Monitor Yes  Cardiac Rhythm NSR  Antiarrhythmic device No  Vascular  R Radial Pulse +2  L Radial Pulse +2  Integumentary  Integumentary (WDL) WDL  Musculoskeletal  Musculoskeletal (WDL) WDL  Assistive Device Wheelchair  GU Assessment  Genitourinary (WDL) X (HD pt)  Psychosocial  Psychosocial (WDL) WDL

## 2018-03-15 NOTE — Progress Notes (Signed)
[facility]  ROUNDING NOTE   Subjective:   Seen and examined on hemodialysis treatment. Tolerating treatment well. UF goal of 1.5 liters.   Patient states she wants to be discharged to her son's home in [place].    HEMODIALYSIS FLOWSHEET:  Blood Flow Rate (mL/min): 400 mL/min Arterial Pressure (mmHg): -210 mmHg Venous Pressure (mmHg): 150 mmHg Transmembrane Pressure (mmHg): 70 mmHg Ultrafiltration Rate (mL/min): 570 mL/min Dialysate Flow Rate (mL/min): 800 ml/min Conductivity: Machine : 13.9 Conductivity: Machine : 13.9 Dialysis Fluid Bolus: Normal Saline Bolus Amount (mL): 250 mL    Objective:  Vital signs in last 24 hours:  Temp:  [98.2 F (36.8 C)-98.4 F (36.9 C)] 98.2 F (36.8 C) (08/26 1036) Pulse Rate:  [61-78] 64 (08/26 1145) Resp:  [12-16] 12 (08/26 1145) BP: (113-141)/(47-65) 113/58 (08/26 1145) SpO2:  [96 %-99 %] 97 % (08/26 1145)  Weight change:  Filed Weights   03/11/18 0340 03/13/18 1315  Weight: 67 kg 65.5 kg    Intake/Output: I/O last 3 completed shifts: In: 1320 [P.O.:1320] Out: -    Intake/Output this shift:  Total I/O In: 480 [P.O.:480] Out: -   Physical Exam: General: NAD, sitting in chair  Head: Normocephalic, atraumatic. Moist oral mucosal membranes  Eyes: Anicteric, PERRL  Neck: Supple, trachea midline  Lungs:  Clear to auscultation  Heart: Regular rate and rhythm  Abdomen:  Soft, nontender,   Extremities:  no peripheral edema.  Neurologic: Nonfocal, moving all four extremities  Skin: No lesions  Access: Left AVF    Basic Metabolic Panel: Recent Labs  Lab 03/09/18 0501 03/10/18 0340 03/13/18 1039  NA 140  --   --   K 3.6  --   --   CL 101  --   --   CO2 31  --   --   GLUCOSE 93  --   --   BUN 37*  --   --   CREATININE 3.16*  --   --   CALCIUM 8.9  --   --   PHOS  --  3.1 4.5    Liver Function Tests: No results for input(s): AST, ALT, ALKPHOS, BILITOT, PROT, ALBUMIN in the last 168 hours. No  results for input(s): LIPASE, AMYLASE in the last 168 hours. No results for input(s): AMMONIA in the last 168 hours.  CBC: Recent Labs  Lab 03/10/18 0340  WBC 9.2  HGB 9.6*  HCT 28.0*  MCV 97.7  PLT 236    Cardiac Enzymes: No results for input(s): CKTOTAL, CKMB, CKMBINDEX, TROPONINI in the last 168 hours.  BNP: Invalid input(s): POCBNP  CBG: Recent Labs  Lab 03/09/18 1633 03/09/18 2018 03/09/18 2356 03/10/18 0414 03/10/18 0752  GLUCAP 99 119* 144* 107* 107*    Microbiology: Results for orders placed or performed during the hospital encounter of 03/04/18  Urine culture     Status: None   Collection Time: 03/04/18 11:03 AM  Result Value Ref Range Status   Specimen Description   Final    URINE, RANDOM Performed at [facility], [address] [place], [place] [address]    Special Requests   Final    NONE Performed at [facility], [address], [place], [place] [address]    Culture   Final    NO GROWTH Performed at [facility], [place] [address] [place], [place] [address]    Report Status 03/06/2018 FINAL  Final  Blood Cultures (routine x 2)     Status: Abnormal  Collection Time: 03/04/18 11:18 AM  Result Value Ref Range Status   Specimen Description   Final    BLOOD RIGHT ARM Performed at [facility], [address], [place], [place] [address]    Special Requests   Final    BOTTLES DRAWN AEROBIC AND ANAEROBIC Blood Culture adequate volume Performed at [facility], [address] [place], [place] [address]    Culture  Setup Time   Final    GRAM POSITIVE COCCI AEROBIC BOTTLE ONLY CRITICAL RESULT CALLED TO, READ BACK BY AND VERIFIED WITH: DAVID BESANTI ON 03/05/18 AT 0109 JAG    Culture (A)  Final    VIRIDANS STREPTOCOCCUS THE SIGNIFICANCE OF ISOLATING THIS ORGANISM FROM A SINGLE SET OF BLOOD CULTURES WHEN MULTIPLE SETS ARE DRAWN IS UNCERTAIN. PLEASE NOTIFY THE MICROBIOLOGY DEPARTMENT WITHIN ONE WEEK IF  SPECIATION AND SENSITIVITIES ARE REQUIRED. Performed at [facility], [place] [address], [place], [place] [address]    Report Status 03/07/2018 FINAL  Final  Blood Culture ID Panel (Reflexed)     Status: Abnormal   Collection Time: 03/04/18 11:18 AM  Result Value Ref Range Status   Enterococcus species NOT DETECTED NOT DETECTED Final   Listeria monocytogenes NOT DETECTED NOT DETECTED Final   Staphylococcus species NOT DETECTED NOT DETECTED Final   Staphylococcus aureus NOT DETECTED NOT DETECTED Final   Streptococcus species DETECTED (A) NOT DETECTED Final    Comment: Not Enterococcus species, Streptococcus agalactiae, Streptococcus pyogenes, or Streptococcus pneumoniae. CRITICAL RESULT CALLED TO, READ BACK BY AND VERIFIED WITH: DAVID BESANTI ON 03/05/18 AT 0109 BY JAG    Streptococcus agalactiae NOT DETECTED NOT DETECTED Final   Streptococcus pneumoniae NOT DETECTED NOT DETECTED Final   Streptococcus pyogenes NOT DETECTED NOT DETECTED Final   Acinetobacter baumannii NOT DETECTED NOT DETECTED Final   Enterobacteriaceae species NOT DETECTED NOT DETECTED Final   Enterobacter cloacae complex NOT DETECTED NOT DETECTED Final   Escherichia coli NOT DETECTED NOT DETECTED Final   Klebsiella oxytoca NOT DETECTED NOT DETECTED Final   Klebsiella pneumoniae NOT DETECTED NOT DETECTED Final   Proteus species NOT DETECTED NOT DETECTED Final   Serratia marcescens NOT DETECTED NOT DETECTED Final   Haemophilus influenzae NOT DETECTED NOT DETECTED Final   Neisseria meningitidis NOT DETECTED NOT DETECTED Final   Pseudomonas aeruginosa NOT DETECTED NOT DETECTED Final   Candida albicans NOT DETECTED NOT DETECTED Final   Candida glabrata NOT DETECTED NOT DETECTED Final   Candida krusei NOT DETECTED NOT DETECTED Final   Candida parapsilosis NOT DETECTED NOT DETECTED Final   Candida tropicalis NOT DETECTED NOT DETECTED Final    Comment: Performed at [facility], [place]  [place], [place] [address]  MRSA PCR Screening     Status: None   Collection Time: 03/04/18 12:37 PM  Result Value Ref Range Status   MRSA by PCR NEGATIVE NEGATIVE Final    Comment:        The GeneXpert MRSA Assay (FDA approved for NASAL specimens only), is one component of a comprehensive MRSA colonization surveillance program. It is not intended to diagnose MRSA infection nor to guide or monitor treatment for MRSA infections. Performed at [facility], [address] [place], [place] [address]   Culture, blood (Routine X 2) w Reflex to ID Panel     Status: None   Collection Time: 03/05/18  2:02 AM  Result Value Ref Range Status   Specimen Description BLOOD RIGHT HAND  Final  Special Requests   Final    BOTTLES DRAWN AEROBIC ONLY Blood Culture results may not be optimal due to an inadequate volume of blood received in culture bottles   Culture   Final    NO GROWTH 5 DAYS Performed at [facility], [address] [place], [place] [address]    Report Status 03/10/2018 FINAL  Final  Culture, blood (Routine X 2) w Reflex to ID Panel     Status: None   Collection Time: 03/05/18  2:03 AM  Result Value Ref Range Status   Specimen Description BLOOD RIGHT HAND  Final   Special Requests   Final    BOTTLES DRAWN AEROBIC ONLY Blood Culture results may not be optimal due to an inadequate volume of blood received in culture bottles   Culture   Final    NO GROWTH 5 DAYS Performed at [facility], [address] [place], [place] [address]    Report Status 03/10/2018 FINAL  Final    Coagulation Studies: No results for input(s): LABPROT, INR in the last 72 hours.  Urinalysis: No results for input(s): COLORURINE, LABSPEC, PHURINE, GLUCOSEU, HGBUR, BILIRUBINUR, KETONESUR, PROTEINUR, UROBILINOGEN, NITRITE, LEUKOCYTESUR in the last 72 hours.  Invalid input(s): APPERANCEUR    Imaging: No results found.   Medications:    . brimonidine  1 drop Both Eyes  BID  . carbamazepine  200 mg Oral TID  . dicyclomine  20 mg Oral TID AC  . famotidine  20 mg Oral Daily  . feeding supplement (NEPRO CARB STEADY)  237 mL Oral BID BM  . haloperidol  2.5 mg Oral Q M,W,F  . metoprolol tartrate  25 mg Oral BID  . multivitamin  1 tablet Oral QHS  . rOPINIRole  2 mg Oral Daily  . timolol  1 drop Both Eyes BID  . vitamin C  250 mg Oral BID  . ziprasidone  60 mg Oral BID WC   acetaminophen, alum & mag hydroxide-simeth, magnesium hydroxide, pentafluoroprop-tetrafluoroeth  Assessment/ Plan:  [name] is a 72 y.o. white female with end stage renal disease on hemodialysis, hypertension, bipolar disorder, GERD, polycystic kidney disease  [facility] MWF left AVF  1. End Stage Renal Disease: seen and examined on hemodialysis treatment. Tolerating treatment well. UF of 1.5 liters.  - Continue MWF schedule.   2. Hypertension: 113/58 - at goal.  - metoprolol  3. Anemia of chronic kidney disease: no EPO with treatment. Pending hemoglobin   4.  Secondary hyperparathyroidism: phosphorus at goal.  - holding binders.    LOS: 4 Aly Hauser 8/26/201911:55 AM

## 2018-03-15 NOTE — Progress Notes (Signed)
D- Patient alert and oriented. Patient presents in a pleasant mood on assessment stating that she slept well last night and had no major complaints to voice to this writer, reporting that she's "doing ok". Patient denies SI, HI, AVH, and pain at this time stating "I had to come to terms with that". Patient also denies any signs/symptoms of depression/anxiety stating "I feel uplifted this morning". Patient's goal for today is to "try to walk without the walker".  A- Scheduled medications administered to patient, per MD orders. Support and encouragement provided.  Routine safety checks conducted every 15 minutes.  Patient informed to notify staff with problems or concerns.  R- No adverse drug reactions noted. Patient contracts for safety at this time. Patient compliant with medications and treatment plan. Patient receptive, calm, and cooperative. Patient interacts well with others on the unit.  Patient remains safe at this time.

## 2018-03-15 NOTE — Progress Notes (Signed)
Patient's morning medications are being held because she is scheduled for dialysis this morning.

## 2018-03-15 NOTE — Progress Notes (Signed)
PT Cancellation Note  Patient Details Name: Joyce [name] MRN: [id number] DOB: [birth year]   Cancelled Treatment:    Reason Eval/Treat Not Completed: Patient at procedure or test/unavailable(for HD).  Attempted to see pt for PT treatment ~11:30; however, pt currently for HD.  Will attempt to see pt again later today, schedule permitting.     Collie Siad PT, DPT 03/15/2018, 1:06 PM

## 2018-03-15 NOTE — Progress Notes (Signed)
HD Treatment Initiated    03/15/18 1046  During Hemodialysis Assessment  Blood Flow Rate (mL/min) 400 mL/min  Arterial Pressure (mmHg) -120 mmHg  Venous Pressure (mmHg) 150 mmHg  Transmembrane Pressure (mmHg) 70 mmHg  Ultrafiltration Rate (mL/min) 570 mL/min  Dialysate Flow Rate (mL/min) 800 ml/min  Conductivity: Machine  13.9  HD Safety Checks Performed Yes  Dialysis Fluid Bolus Normal Saline  Bolus Amount (mL) 250 mL  Intra-Hemodialysis Comments Tx initiated  Fistula / Graft Left Upper arm Arteriovenous fistula  Placement Date: 08/13/15   Placed prior to admission: Yes  Orientation: Left  Access Location: Upper arm  Access Type: Arteriovenous fistula  Status Accessed  Needle Size 15

## 2018-03-15 NOTE — Progress Notes (Addendum)
[facility] MD Progress Note  03/16/2018 10:30 AM Joyce [name]  MRN:  [id number]  Subjective:   Joyce [name] has no complaints. Her mood is stable, affect bright. She accepts medications and tolerates them well. She took dialysis well yesterday. She still has a sitter due to fall risk. She was reevaluated by PT yesterday and they still recommend SNF following discharge. So far, we have not been able to identify a facility that would accept this complicated patient.   Spoke with Joyce [name], daughter in Sports [employer]. The family visited last night, They found the patient somewhat confused and manipulative but overall pleasant and agreable. Avi is happy that she will eventually return to her son's place. She no longer wants to live independently.  Principal Problem: Bipolar I disorder, current or most recent episode depressed, with psychotic features [facility]) Diagnosis:   Patient Active Problem List   Diagnosis Date Noted  . Bipolar I disorder, current or most recent episode depressed, with psychotic features ([place]) [F31.5] 02/26/2018    Priority: High  . Bipolar affective disorder, current episode depressed with psychotic symptoms ([place]) [F31.2] 09/17/2017    Priority: High  . Bipolar I disorder, most recent episode (or current) manic ([place]) [F31.10] 09/02/2017    Priority: High  . Suicide attempt ([place]) [T14.91XA] 03/09/2018  . Acute respiratory failure with hypoxia ([place]) [J96.01] 03/04/2018  . UTI (urinary tract infection) [N39.0] 12/16/2017  . Palliative care encounter [Z51.5]   . Acute delirium [R41.0] 09/02/2017  . Altered mental status [R41.82]   . Acute encephalopathy [G93.40] 09/01/2017  . ESRD on dialysis ([place]) [N18.6, Z99.2] 04/06/2017  . Complication of vascular access for dialysis [T82.9XXA] 04/06/2017  . Hematuria [R31.9] 12/18/2016  . Polycystic kidney [Q61.3] 10/12/2016  . Ruptured cyst of kidney [Q61.00] 10/03/2016  . HTN (hypertension) [I10] 10/01/2016  . Chronic kidney disease [N18.9]  10/01/2016  . Anemia associated with chronic renal failure [N18.9, D63.1] 09/24/2016  . Acute kidney insufficiency [N28.9] 07/28/2016  . Noninfectious diarrhea [K52.9]   . Benign neoplasm of cecum [D12.0]   . Benign neoplasm of ascending colon [D12.2]   . Diarrhea [R19.7]   . Nausea [R11.0]   . Gastric polyp [K31.7]   . Chronic constipation [K59.09] 12/27/2014  . Nausea with vomiting [R11.2] 12/27/2014  . Left sided abdominal pain [R10.9] 12/27/2014  . Personal history of colonic polyps [Z86.010] 05/10/2013   Total Time spent with patient: 30 minutes  Past Psychiatric History: bipolar disorder  Past Medical History:  Past Medical History:  Diagnosis Date  . Anal fissure   . Bipolar affective disorder ([place])   . CKD (chronic kidney disease)    Dr Holley Raring [name] 4  . Colon polyps   . Diverticulitis   . Diverticulitis   . Family history of adverse reaction to anesthesia    mom - PONV  . GERD (gastroesophageal reflux disease)   . Headache    migraines - none over 10 yrs  . Heart murmur   . History of hiatal hernia   . Hypertension   . Pancreatitis    Valproic acid  . Vertigo     Past Surgical History:  Procedure Laterality Date  . A/V FISTULAGRAM Left 04/14/2017   Procedure: A/V Fistulagram;  Surgeon: Katha Cabal, MD;  Location: [facility];  Service: Cardiovascular;  Laterality: Left;  . A/V FISTULAGRAM Left 06/09/2017   Procedure: A/V FISTULAGRAM;  Surgeon: Katha Cabal, MD;  Location: [place] [facility];  Service: Cardiovascular;  Laterality: Left;  .  A/V FISTULAGRAM Left 11/02/2017   Procedure: A/V FISTULAGRAM;  Surgeon: Algernon Huxley, MD;  Location: [place] [facility];  Service: Cardiovascular;  Laterality: Left;  . A/V SHUNT INTERVENTION N/A 11/02/2017   Procedure: A/V SHUNT INTERVENTION;  Surgeon: Algernon Huxley, MD;  Location: [place] [facility];  Service: Cardiovascular;  Laterality: N/A;  . ABDOMINAL HYSTERECTOMY  1990 ?  . AV FISTULA  PLACEMENT  4/30  . BREAST EXCISIONAL BIOPSY Left 1994   neg surgical bx  . CHOLECYSTECTOMY  2003  . COLONOSCOPY  2014   Dr. Jamal Collin  . COLONOSCOPY WITH PROPOFOL N/A 09/24/2015   Procedure: COLONOSCOPY WITH random colon byopies.;  Surgeon: Lucilla Lame, MD;  Location: [place];  Service: Endoscopy;  Laterality: N/A;  . ESOPHAGOGASTRODUODENOSCOPY (EGD) WITH PROPOFOL N/A 09/24/2015   Procedure: ESOPHAGOGASTRODUODENOSCOPY (EGD) ;  Surgeon: Lucilla Lame, MD;  Location: [place];  Service: Endoscopy;  Laterality: N/A;  . EYE SURGERY    . PERIPHERAL VASCULAR CATHETERIZATION N/A 05/29/2015   Procedure: A/V Shuntogram/Fistulagram;  Surgeon: Katha Cabal, MD;  Location: [place] [facility];  Service: Cardiovascular;  Laterality: N/A;  . PERIPHERAL VASCULAR CATHETERIZATION N/A 05/29/2015   Procedure: A/V Shunt Intervention;  Surgeon: Katha Cabal, MD;  Location: [place] [facility];  Service: Cardiovascular;  Laterality: N/A;  . POLYPECTOMY  09/24/2015   Procedure: POLYPECTOMY INTESTINAL;  Surgeon: Lucilla Lame, MD;  Location: [place];  Service: Endoscopy;;  cecal polyp ascending polyp   Family History:  Family History  Problem Relation Age of Onset  . Stroke Father   . Hypertension Father   . Breast cancer Other   . Colon cancer Neg Hx   . Liver disease Neg Hx    Family Psychiatric  History: none Social History:  Social History   Substance and Sexual Activity  Alcohol Use No  . Alcohol/week: 0.0 standard drinks     Social History   Substance and Sexual Activity  Drug Use No    Social History   Socioeconomic History  . Marital status: Single    Spouse name: Not on file  . Number of children: 1  . Years of education: Not on file  . Highest education level: Not on file  Occupational History  . Occupation: Surveyor, quantity: [employer]  Social Needs  . Financial resource strain: Not on file  . Food insecurity:    Worry: Not on file     Inability: Not on file  . Transportation needs:    Medical: Not on file    Non-medical: Not on file  Tobacco Use  . Smoking status: Never Smoker  . Smokeless tobacco: Never Used  Substance and Sexual Activity  . Alcohol use: No    Alcohol/week: 0.0 standard drinks  . Drug use: No  . Sexual activity: Not Currently  Lifestyle  . Physical activity:    Days per week: Not on file    Minutes per session: Not on file  . Stress: Not on file  Relationships  . Social connections:    Talks on phone: Not on file    Gets together: Not on file    Attends religious service: Not on file    Active member of club or organization: Not on file    Attends meetings of clubs or organizations: Not on file    Relationship status: Not on file  Other Topics Concern  . 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   Additional Social History:                         Sleep: Fair  Appetite:  Fair  Current Medications: Current Facility-Administered Medications  Medication Dose Route Frequency Provider Last Rate Last Dose  . acetaminophen (TYLENOL) tablet 650 mg  650 mg Oral Q6H PRN Clapacs, [name], MD      . alum & mag hydroxide-simeth (MAALOX/MYLANTA) 200-200-20 MG/5ML suspension 30 mL  30 mL Oral Q4H PRN Clapacs, [name], MD   30 mL at 03/14/18 1733  . brimonidine (ALPHAGAN) 0.2 % ophthalmic solution 1 drop  1 drop Both Eyes BID Clapacs, [name], MD   1 drop at 03/16/18 0818  . carbamazepine (TEGRETOL) tablet 200 mg  200 mg Oral TID Clapacs, [name], MD   200 mg at 03/16/18 0818  . dicyclomine (BENTYL) tablet 20 mg  20 mg Oral TID AC Clapacs, [name], MD   20 mg at 03/16/18 0818  . famotidine (PEPCID) tablet 20 mg  20 mg Oral Daily Clapacs, [name], MD   20 mg at 03/16/18 0818  . feeding supplement (NEPRO CARB STEADY) liquid 237 mL  237 mL Oral BID BM Clapacs, [name], MD   237 mL at 03/16/18 1026  . haloperidol  (HALDOL) tablet 2.5 mg  2.5 mg Oral Q M,W,F Clapacs, [name], MD   2.5 mg at 03/12/18 1140  . magnesium hydroxide (MILK OF MAGNESIA) suspension 30 mL  30 mL Oral Daily PRN Clapacs, [name], MD      . metoprolol tartrate (LOPRESSOR) tablet 25 mg  25 mg Oral BID Clapacs, [name], MD   25 mg at 03/13/18 1606  . multivitamin (RENA-VIT) tablet 1 tablet  1 tablet Oral QHS Zykeem Bauserman B, MD   1 tablet at 03/15/18 2133  . pentafluoroprop-tetrafluoroeth (GEBAUERS) aerosol   Topical PRN Clapacs, [name], MD      . rOPINIRole (REQUIP) tablet 2 mg  2 mg Oral Daily Clapacs, [name], MD   2 mg at 03/16/18 0818  . timolol (TIMOPTIC) 0.5 % ophthalmic solution 1 drop  1 drop Both Eyes BID Clapacs, [name], MD   1 drop at 03/16/18 0818  . vitamin C (ASCORBIC ACID) tablet 250 mg  250 mg Oral BID [place] Thielman B, MD   250 mg at 03/16/18 0819  . ziprasidone (GEODON) capsule 60 mg  60 mg Oral BID WC Greg Eckrich B, MD   60 mg at 03/16/18 0818    Lab Results:  Results for orders placed or performed during the hospital encounter of 03/11/18 (from the past 48 hour(s))  CBC     Status: Abnormal   Collection Time: 03/15/18 11:50 AM  Result Value Ref Range   WBC 9.2 3.6 - 11.0 K/uL   RBC 2.55 (L) 3.80 - 5.20 MIL/uL   Hemoglobin 8.7 (L) 12.0 - 16.0 g/dL   HCT 24.9 (L) 35.0 - 47.0 %   MCV 97.5 80.0 - 100.0 fL   MCH 34.0 26.0 - 34.0 pg   MCHC 34.9 32.0 - 36.0 g/dL   RDW 13.5 11.5 - 14.5 %   Platelets 259 150 - 440 K/uL    Comment: Performed at [facility], [address] [place], [place] [address]  Renal function panel     Status: Abnormal   Collection Time: 03/15/18  11:59 AM  Result Value Ref Range   Sodium 132 (L) 135 - 145 mmol/L   Potassium 4.1 3.5 - 5.1 mmol/L   Chloride 95 (L) 98 - 111 mmol/L   CO2 27 22 - 32 mmol/L   Glucose, Bld 116 (H) 70 - 99 mg/dL   BUN 50 (H) 8 - 23 mg/dL   Creatinine, Ser 5.22 (H) 0.44 - 1.00 mg/dL   Calcium 8.8 (L) 8.9 - 10.3 mg/dL   Phosphorus 3.3 2.5  - 4.6 mg/dL   Albumin 3.1 (L) 3.5 - 5.0 g/dL   GFR calc non Af Amer 8 (L) >60 mL/min   GFR calc Af Amer 9 (L) >60 mL/min    Comment: (NOTE) The eGFR has been calculated using the CKD EPI equation. This calculation has not been validated in all clinical situations. eGFR's persistently <60 mL/min signify possible Chronic Kidney Disease.    Anion gap 10 5 - 15    Comment: Performed at [facility], [address] [place], [place] [address]    Blood Alcohol level:  Lab Results  Component Value Date   [facility] <10 03/04/2018   ETH <10 01/18/1600    Metabolic Disorder Labs: Lab Results  Component Value Date   HGBA1C 4.8 09/18/2017   MPG 91.06 09/18/2017   No results found for: PROLACTIN Lab Results  Component Value Date   CHOL 251 (H) 09/18/2017   TRIG 124 09/18/2017   HDL 66 09/18/2017   CHOLHDL 3.8 09/18/2017   VLDL 25 09/18/2017   LDLCALC 160 (H) 09/18/2017   LDLCALC 43 05/14/2014    Physical Findings: AIMS:  , ,  ,  ,    CIWA:    COWS:     Musculoskeletal: Strength & Muscle Tone: within normal limits Gait & Station: normal Patient leans: N/A  Psychiatric Specialty Exam: Physical Exam  Nursing note and vitals reviewed. Psychiatric: Her speech is normal and behavior is normal. Thought content normal. Her mood appears anxious. Cognition and memory are normal. She expresses impulsivity.    Review of Systems  Neurological: Negative.   Psychiatric/Behavioral: Negative.   All other systems reviewed and are negative.   Blood pressure (!) 131/44, pulse 93, temperature 98.9 F (37.2 C), temperature source Oral, resp. rate 16, height 5' 3" (1.6 m), weight 66 kg, SpO2 100 %.Body mass index is 25.77 kg/m.  General Appearance: Casual  Eye Contact:  Good  Speech:  Clear and Coherent  Volume:  Normal  Mood:  Anxious  Affect:  Appropriate  Thought Process:  Goal Directed and Descriptions of Associations: Intact  Orientation:  Full (Time, Place, and Person)   Thought Content:  WDL  Suicidal Thoughts:  No  Homicidal Thoughts:  No  Memory:  Immediate;   Fair Recent;   Fair Remote;   Fair  Judgement:  Impaired  Insight:  Shallow  Psychomotor Activity:  Normal  Concentration:  Concentration: Fair and Attention Span: Fair  Recall:  [employer] of Knowledge:  Fair  Language:  Fair  Akathisia:  No  Handed:  Right  AIMS (if indicated):     Assets:  Communication Skills Desire for Improvement Financial Resources/Insurance Housing Resilience Social Support  ADL's:  Intact  Cognition:  WNL  Sleep:  Number of Hours: 8     Treatment Plan Summary: Daily contact with patient to assess and evaluate symptoms and progress in treatment and Medication management   Ms. Hewins is a 72 year old female with a history of bipolar disorder, recently  discharged from psychiatry, who was readmitted immediately after discharge after Intentional overdose on medications when facing real or imaginary financial burden of dialysis. She was intubated and admitted to ICU. She returns to psychiatry unable to ambulate, swallow and, psychtoic with paranoid delusions and hallucinations.   #Mood/psychosis, improving -continue Tegretol 200 mgTID -we will attempt to discontinue Haldol 2.5 mg that was given before dialysis on M,W,Fri -continueGeodon to 60 mg BID  #HTN -Metoprolol 25 mg BID  #Dialysis -nephrology input is appreciated  #Deconditioning, improving -difficulties ambulating, unsteady gait -PT recommends SNF after discharge  #Dysphagia, resolved -mechanical soft diet with thin liquids -speech pathology eval  #GERD -Pepcid 20 mg  #Restless legs -Requip 2 mg nightly  #Glaucoma -continue eye drops  #Disposition -patient will eventually return to live with her son -patient referred toSNFbut so far rejected by several facilities -follow up TBE  Orson Slick, MD 03/16/2018, 10:30 AM

## 2018-03-15 NOTE — Progress Notes (Signed)
Physical Therapy Treatment Patient Details Name: Joyce [name] MRN: [id number] DOB: 07-Sep-1945 Today's Date: 03/15/2018    History of Present Illness 72 y.o. female recently admitted inpt with acute respiratory failure with hypoxia  that was possibly due to drug overdose. Pt was intubated on 8/15, became responsive on 8/18, and was extubated 8/19. Pt had been d/c from psychiatric care just before being addmitted to hospital.   Now is re-evaluated on behavioral med unit for PT and mobility.  PMH includes CKD, bipolar disorder, diverticulitis, hiatal hernia, HTN, and vertigo.     PT Comments    Pt reported feeling fatigued on PT arrival after morning of HD. Pt was again orthostatic in nature. Pt BP was 129/58 in supine with HR in mid 80's. Pt blood pressure decreased to 121/47 in sitting with HR of 104. And decreased again with standing to 113/61 with HR of 106. Pt sat down after ~30 secs standing due to "weak legs". After some deep breathing and rest pt attempted Sit to stand again and BP dropped to 90/48 HR 115. Pt reported tingling in lips and vision going black. Pt was returned to supine and BP returned to 135/57. Per RN report pt had been up several times over past 48 hours ambulating w/RW without difficulty. Pt reported she had some dizziness with walking to lunch after HD this morning. PT expects pt will be able to return to baseline level of mobility and function when orthostatic episodes are medically managed. However at this time PT has not seen patient perform any mobility activities due to BP drops, and thus recommends transition to STR upon discharge from acute hospitalization.  Follow Up Recommendations  SNF     Equipment Recommendations  None recommended by PT    Recommendations for Other Services       Precautions / Restrictions Precautions Precautions: Fall Precaution Comments: Orthostatic Restrictions Weight Bearing Restrictions: No    Mobility  Bed Mobility Overal  bed mobility: Needs Assistance Bed Mobility: Supine to Sit;Sit to Supine     Supine to sit: Supervision Sit to supine: Supervision      Transfers Overall transfer level: Needs assistance Equipment used: 1 person hand held assist Transfers: Sit to/from Stand Sit to Stand: Min guard            Ambulation/Gait Ambulation/Gait assistance: (defferred due to orthostatic)           General Gait Details: deferred due to standing orthostatic   Stairs             Wheelchair Mobility    Modified Rankin (Stroke Patients Only)       Balance Overall balance assessment: Needs assistance Sitting-balance support: Feet supported Sitting balance-Leahy Scale: Good     Standing balance support: Bilateral upper extremity supported;During functional activity Standing balance-Leahy Scale: Fair                              Cognition Arousal/Alertness: Awake/alert Behavior During Therapy: WFL for tasks assessed/performed Overall Cognitive Status: Within Functional Limits for tasks assessed                                        Exercises Other Exercises Other Exercises: Bed mobility: Supine to sit; Trasnfers: Sit to stand    General Comments        Pertinent Vitals/Pain Pain  Assessment: No/denies pain    Home Living                      Prior Function            PT Goals (current goals can now be found in the care plan section)      Frequency    Min 2X/week      PT Plan      Co-evaluation              AM-PAC PT "6 Clicks" Daily Activity  Outcome Measure  Difficulty turning over in bed (including adjusting bedclothes, sheets and blankets)?: A Little Difficulty moving from lying on back to sitting on the side of the bed? : A Little Difficulty sitting down on and standing up from a chair with arms (e.g., wheelchair, bedside commode, etc,.)?: A Little Help needed moving to and from a bed to chair  (including a wheelchair)?: A Little Help needed walking in hospital room?: A Little Help needed climbing 3-5 steps with a railing? : A Little 6 Click Score: 18    End of Session Equipment Utilized During Treatment: Gait belt Activity Tolerance: Treatment limited secondary to medical complications (Comment)(Orthostatic event) Patient left: in bed;with call bell/phone within reach;with nursing/sitter in room Nurse Communication: Mobility status;Other (comment)(Informed nurisng of BP drop) PT Visit Diagnosis: Unsteadiness on feet (R26.81);Other abnormalities of gait and mobility (R26.89);Muscle weakness (generalized) (M62.81);Difficulty in walking, not elsewhere classified (R26.2)     Time: 8251-8984 PT Time Calculation (min) (ACUTE ONLY): 20 min  Charges:  $Therapeutic Activity: 8-22 mins                    Hortencia Conradi, SPT 03/15/18,5:08 PM

## 2018-03-15 NOTE — Progress Notes (Signed)
HD Treatment Complete     03/15/18 1430  Vital Signs  Pulse Rate 82  Pulse Rate Source Monitor  Resp 14  BP (!) 139/50  BP Location Right Arm  BP Method Automatic  Patient Position (if appropriate) Lying  Oxygen Therapy  SpO2 99 %  O2 Device Room Air  During Hemodialysis Assessment  Intra-Hemodialysis Comments Tx completed  Fistula / Graft Left Upper arm Arteriovenous fistula  Placement Date: 08/13/15   Placed prior to admission: Yes  Orientation: Left  Access Location: Upper arm  Access Type: Arteriovenous fistula  Status Deaccessed

## 2018-03-16 NOTE — Progress Notes (Signed)
[facility] MD Progress Note  03/17/2018 1:50 PM Joyce [name]  MRN:  [id number]  Subjective:    Joyce [name] is in dialysis. Her blood pressure is holding up today and she feels more energetic. She seems fine without premedication with Haldol. She has been walking with a belt only since yesterday and doing well. PT no longer recommends SNF only home PT. I am not sure of her insurance will pay for PT but the patient was referred to [facility] and will recieve support in the community.   Joyce [name] is clearly frightened to even talk about discharge. She is unable to contract for safety if discharged. At least, she is more honest than her last admission when she ended up suiciding without [place] admitting to thoughts of self harm.   She wants to make sure that her family agrees with the plan. SW to talk to [name], daughter in law, about suicide prevention.   Principal Problem: Bipolar I disorder, current or most recent episode depressed, with psychotic features [facility]) Diagnosis:   Patient Active Problem List   Diagnosis Date Noted  . Bipolar I disorder, current or most recent episode depressed, with psychotic features ([place]) [F31.5] 02/26/2018    Priority: High  . Bipolar affective disorder, current episode depressed with psychotic symptoms ([place]) [F31.2] 09/17/2017    Priority: High  . Bipolar I disorder, most recent episode (or current) manic ([place]) [F31.10] 09/02/2017    Priority: High  . Suicide attempt ([place]) [T14.91XA] 03/09/2018  . Acute respiratory failure with hypoxia ([place]) [J96.01] 03/04/2018  . UTI (urinary tract infection) [N39.0] 12/16/2017  . Palliative care encounter [Z51.5]   . Acute delirium [R41.0] 09/02/2017  . Altered mental status [R41.82]   . Acute encephalopathy [G93.40] 09/01/2017  . ESRD on dialysis ([place]) [N18.6, Z99.2] 04/06/2017  . Complication of vascular access for dialysis [T82.9XXA] 04/06/2017  . Hematuria [R31.9] 12/18/2016  . Polycystic kidney [Q61.3] 10/12/2016  . Ruptured cyst  of kidney [Q61.00] 10/03/2016  . HTN (hypertension) [I10] 10/01/2016  . Chronic kidney disease [N18.9] 10/01/2016  . Anemia associated with chronic renal failure [N18.9, D63.1] 09/24/2016  . Acute kidney insufficiency [N28.9] 07/28/2016  . Noninfectious diarrhea [K52.9]   . Benign neoplasm of cecum [D12.0]   . Benign neoplasm of ascending colon [D12.2]   . Diarrhea [R19.7]   . Nausea [R11.0]   . Gastric polyp [K31.7]   . Chronic constipation [K59.09] 12/27/2014  . Nausea with vomiting [R11.2] 12/27/2014  . Left sided abdominal pain [R10.9] 12/27/2014  . Personal history of colonic polyps [Z86.010] 05/10/2013   Total Time spent with patient: 20 minutes  Past Psychiatric History: bipolar disorder  Past Medical History:  Past Medical History:  Diagnosis Date  . Anal fissure   . Bipolar affective disorder ([place])   . CKD (chronic kidney disease)    Dr Holley Raring [name] 4  . Colon polyps   . Diverticulitis   . Diverticulitis   . Family history of adverse reaction to anesthesia    mom - PONV  . GERD (gastroesophageal reflux disease)   . Headache    migraines - none over 10 yrs  . Heart murmur   . History of hiatal hernia   . Hypertension   . Pancreatitis    Valproic acid  . Vertigo     Past Surgical History:  Procedure Laterality Date  . A/V FISTULAGRAM Left 04/14/2017   Procedure: A/V Fistulagram;  Surgeon: Katha Cabal, MD;  Location: [place] [facility];  Service: Cardiovascular;  Laterality: Left;  . A/V FISTULAGRAM Left 06/09/2017   Procedure: A/V FISTULAGRAM;  Surgeon: Katha Cabal, MD;  Location: [place] [facility];  Service: Cardiovascular;  Laterality: Left;  . A/V FISTULAGRAM Left 11/02/2017   Procedure: A/V FISTULAGRAM;  Surgeon: Algernon Huxley, MD;  Location: [place] [facility];  Service: Cardiovascular;  Laterality: Left;  . A/V SHUNT INTERVENTION N/A 11/02/2017   Procedure: A/V SHUNT INTERVENTION;  Surgeon: Algernon Huxley, MD;  Location: [place] [facility]  [facility];  Service: Cardiovascular;  Laterality: N/A;  . ABDOMINAL HYSTERECTOMY  1990 ?  . AV FISTULA PLACEMENT  4/30  . BREAST EXCISIONAL BIOPSY Left 1994   neg surgical bx  . CHOLECYSTECTOMY  2003  . COLONOSCOPY  2014   Dr. Jamal Collin  . COLONOSCOPY WITH PROPOFOL N/A 09/24/2015   Procedure: COLONOSCOPY WITH random colon byopies.;  Surgeon: Lucilla Lame, MD;  Location: [place];  Service: Endoscopy;  Laterality: N/A;  . ESOPHAGOGASTRODUODENOSCOPY (EGD) WITH PROPOFOL N/A 09/24/2015   Procedure: ESOPHAGOGASTRODUODENOSCOPY (EGD) ;  Surgeon: Lucilla Lame, MD;  Location: [place];  Service: Endoscopy;  Laterality: N/A;  . EYE SURGERY    . PERIPHERAL VASCULAR CATHETERIZATION N/A 05/29/2015   Procedure: A/V Shuntogram/Fistulagram;  Surgeon: Katha Cabal, MD;  Location: [place] [facility];  Service: Cardiovascular;  Laterality: N/A;  . PERIPHERAL VASCULAR CATHETERIZATION N/A 05/29/2015   Procedure: A/V Shunt Intervention;  Surgeon: Katha Cabal, MD;  Location: [place] [facility];  Service: Cardiovascular;  Laterality: N/A;  . POLYPECTOMY  09/24/2015   Procedure: POLYPECTOMY INTESTINAL;  Surgeon: Lucilla Lame, MD;  Location: [place];  Service: Endoscopy;;  cecal polyp ascending polyp   Family History:  Family History  Problem Relation Age of Onset  . Stroke Father   . Hypertension Father   . Breast cancer Other   . Colon cancer Neg Hx   . Liver disease Neg Hx    Family Psychiatric  History: none Social History:  Social History   Substance and Sexual Activity  Alcohol Use No  . Alcohol/week: 0.0 standard drinks     Social History   Substance and Sexual Activity  Drug Use No    Social History   Socioeconomic History  . Marital status: Single    Spouse name: Not on file  . Number of children: 1  . Years of education: Not on file  . Highest education level: Not on file  Occupational History  . Occupation: Surveyor, quantity: [employer]  Social  Needs  . Financial resource strain: Not on file  . Food insecurity:    Worry: Not on file    Inability: Not on file  . Transportation needs:    Medical: Not on file    Non-medical: Not on file  Tobacco Use  . Smoking status: Never Smoker  . Smokeless tobacco: Never Used  Substance and Sexual Activity  . Alcohol use: No    Alcohol/week: 0.0 standard drinks  . Drug use: No  . Sexual activity: Not Currently  Lifestyle  . Physical activity:    Days per week: Not on file    Minutes per session: Not on file  . Stress: Not on file  Relationships  . Social connections:    Talks on phone: Not on file    Gets together: Not on file    Attends religious service: Not on file    Active member of club or organization: Not on file  Attends meetings of clubs or organizations: Not on file    Relationship status: Not on file  Other Topics Concern  . 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   Additional Social History:                         Sleep: Fair  Appetite:  Fair  Current Medications: Current Facility-Administered Medications  Medication Dose Route Frequency Provider Last Rate Last Dose  . acetaminophen (TYLENOL) tablet 650 mg  650 mg Oral Q6H PRN Clapacs, [name], MD      . alum & mag hydroxide-simeth (MAALOX/MYLANTA) 200-200-20 MG/5ML suspension 30 mL  30 mL Oral Q4H PRN Clapacs, [name], MD   30 mL at 03/14/18 1733  . brimonidine (ALPHAGAN) 0.2 % ophthalmic solution 1 drop  1 drop Both Eyes BID Clapacs, [name], MD   1 drop at 03/17/18 0830  . carbamazepine (TEGRETOL) tablet 200 mg  200 mg Oral TID Clapacs, [name], MD   Stopped at 03/17/18 [phone]  . dicyclomine (BENTYL) tablet 20 mg  20 mg Oral TID AC Clapacs, [name], MD   Stopped at 03/17/18 [phone]  . epoetin alfa (EPOGEN,PROCRIT) injection 10,000 Units  10,000 Units Intravenous Q M,W,F-HD Lavonia Dana, MD   10,000 Units at  03/17/18 1157  . famotidine (PEPCID) tablet 20 mg  20 mg Oral Daily Clapacs, [name], MD   Stopped at 03/17/18 0840  . feeding supplement (NEPRO CARB STEADY) liquid 237 mL  237 mL Oral BID BM Clapacs, [name], MD   237 mL at 03/17/18 1100  . magnesium hydroxide (MILK OF MAGNESIA) suspension 30 mL  30 mL Oral Daily PRN Clapacs, [name], MD      . metoprolol tartrate (LOPRESSOR) tablet 25 mg  25 mg Oral BID Clapacs, [name], MD   Stopped at 03/17/18 [phone]  . multivitamin (RENA-VIT) tablet 1 tablet  1 tablet Oral QHS [name], [name], MD   1 tablet at 03/16/18 2038  . pentafluoroprop-tetrafluoroeth (GEBAUERS) aerosol   Topical PRN Clapacs, [name], MD      . pentafluoroprop-tetrafluoroeth (GEBAUERS) aerosol   Topical PRN Kolluru, Lurena Nida, MD      . rOPINIRole (REQUIP) tablet 2 mg  2 mg Oral Daily Clapacs, [name], MD   Stopped at 03/17/18 [phone]  . timolol (TIMOPTIC) 0.5 % ophthalmic solution 1 drop  1 drop Both Eyes BID Clapacs, [name], MD   1 drop at 03/17/18 0830  . vitamin C (ASCORBIC ACID) tablet 250 mg  250 mg Oral BID Orson Slick B, MD   Stopped at 03/17/18 0849  . ziprasidone (GEODON) capsule 60 mg  60 mg Oral BID WC [name], [name], MD   Stopped at 03/17/18 0849    Lab Results:  No results found for this or any previous visit (from the past 48 hour(s)).  Blood Alcohol level:  Lab Results  Component Value Date   ETH <10 03/04/2018   ETH <10 22/29/7989    Metabolic Disorder Labs: Lab Results  Component Value Date   HGBA1C 4.8 09/18/2017   MPG 91.06 09/18/2017   No results found for: PROLACTIN Lab Results  Component Value Date   CHOL 251 (H) 09/18/2017   TRIG 124 09/18/2017   HDL 66 09/18/2017   CHOLHDL 3.8 09/18/2017   VLDL 25 09/18/2017   LDLCALC 160 (H)  09/18/2017   LDLCALC 43 05/14/2014    Physical Findings: AIMS:  , ,  ,  ,    CIWA:    COWS:     Musculoskeletal: Strength & Muscle Tone: decreased Gait & Station: unsteady Patient leans:  N/A  Psychiatric Specialty Exam: Physical Exam  Nursing note and vitals reviewed. Psychiatric: She has a normal mood and affect. Her speech is normal and behavior is normal. Thought content normal. Cognition and memory are normal. She expresses impulsivity.    Review of Systems  Neurological: Negative.   Psychiatric/Behavioral: Positive for depression.  All other systems reviewed and are negative.   Blood pressure 114/60, pulse 70, temperature 98.1 F (36.7 C), temperature source Oral, resp. rate 16, height 5\' 3"  (1.6 m), weight 68.1 kg, SpO2 100 %.Body mass index is 26.59 kg/m.  General Appearance: Casual  Eye Contact:  Good  Speech:  Clear and Coherent  Volume:  Normal  Mood:  Anxious  Affect:  Depressed and Flat  Thought Process:  Goal Directed and Descriptions of Associations: Tangential  Orientation:  Full (Time, Place, and Person)  Thought Content:  WDL  Suicidal Thoughts:  No  Homicidal Thoughts:  No  Memory:  Immediate;   Poor Recent;   Poor Remote;   Poor  Judgement:  Impaired  Insight:  Shallow  Psychomotor Activity:  Psychomotor Retardation  Concentration:  Concentration: Poor and Attention Span: Poor  Recall:  Poor  Fund of Knowledge:  Fair  Language:  Fair  Akathisia:  No  Handed:  Right  AIMS (if indicated):     Assets:  Communication Skills Desire for Improvement Financial Resources/Insurance Housing Resilience Social Support  ADL's:  Intact  Cognition:  WNL  Sleep:  Number of Hours: 8.25     Treatment Plan Summary: Daily contact with patient to assess and evaluate symptoms and progress in treatment and Medication management   Joyce [name] is a 72 year old female with a history of bipolar disorder, recently discharged from psychiatry, who was readmitted immediately after discharge after Intentional overdose on medications when facing real or imaginary financial burden of dialysis. She was intubated and admitted to ICU. She returns to psychiatry  unable to ambulate, swallow and, psychtoic with paranoid delusions and hallucinations.   #Mood/psychosis, improving -continue Tegretol 200 mgTID -discontinue Haldol 2.5 mg that was given before dialysis on M,W,Fri -continueGeodon to 60 mg BID  #HTN -Metoprolol 25 mg BID  #Dialysis -nephrology input is appreciated  #Deconditioning, improving -difficultiesambulating, unsteady gait -PTrecommends SNFafter discharge  #Dysphagia, resolved -mechanical soft diet with thin liquids -speech pathology eval  #GERD -Pepcid 20 mg  #Restless legs -Requip 2 mg nightly  #Glaucoma -continue eye drops  #Disposition -patient will eventually return to live with her son -patient referred toSNFbut so far rejected by several facilities -follow upTBE  Orson Slick, MD 03/17/2018, 1:50 PM

## 2018-03-16 NOTE — Progress Notes (Signed)
1:1 Patient Hourly Rounding  0800: Patient is in the dayroom eating breakfast with her assigned safety sitter present.  0900: Patient is in her room, laying in bed, with her assigned safety sitter present.  1000: Patient is in group therapy with her assigned safety sitter present.  1100: Patient is working with PT, walking the hallways on the unit. Safety sitter is present walking the hallways with patient.  1200: Patient is in the community room eating lunch with the other members on the unit and staff present at her side.  1300: Patient is in her room, laying down, with her assigned safety sitter present.  1400: Patient is asleep, in her room, with her assigned safety sitter present.   1500: Patient is asleep, in her room, with her assigned safety sitter present.  1600: Patient is in the dayroom with her assigned safety sitter present.  1700: Patient is in the dayroom with her assigned safety sitter present.  1800: Patient is in the dayroom, watching tv, with her safety sitter present.  1900: Patient is in the shower with her assigned safety sitter present.

## 2018-03-16 NOTE — Plan of Care (Signed)
Patient verbalizes understanding of the general information that's been provided to her and all questions/concerns have been addressed and answered at this time. Patient denies SI/HI stating to this writer "I had to come to terms with that". Patient also denies AVH stating "not that I know of I'm trying to think clearer". Patient states that she is "doing ok" today. Patient verbalizes understanding of and has been in compliance with her prescribed therapeutic regimen and all questions/concerns have been addressed and answered at this time. Patient denies depression stating to this writing "I'm feeling blessed", and she states that she's a little antsy this morning because "I'm trying to be more patient". Patient has the ability to make decisions for herself and her goal is to "walk more with my legs" in which she has been observed walking around the unit with her assigned safety sitter. Patient remains safe on the unit at this time.   Problem: Education: Goal: Knowledge of [place] General Education information/materials will improve Outcome: Progressing Goal: Emotional status will improve Outcome: Progressing Goal: Mental status will improve Outcome: Progressing Goal: Verbalization of understanding the information provided will improve Outcome: Progressing   Problem: Education: Goal: Utilization of techniques to improve thought processes will improve Outcome: Progressing Goal: Knowledge of the prescribed therapeutic regimen will improve Outcome: Progressing   Problem: Activity: Goal: Interest or engagement in leisure activities will improve Outcome: Progressing   Problem: Coping: Goal: Coping ability will improve Outcome: Progressing Goal: Will verbalize feelings Outcome: Progressing   Problem: Health Behavior/Discharge Planning: Goal: Compliance with therapeutic regimen will improve Outcome: Progressing   Problem: Education: Goal: Ability to make informed decisions regarding  treatment will improve Outcome: Progressing   Problem: Self-Concept: Goal: Ability to disclose and discuss suicidal ideas will improve Outcome: Progressing Goal: Will verbalize positive feelings about self Outcome: Progressing

## 2018-03-16 NOTE — Progress Notes (Signed)
D: Pt denies SI/HI/AVH. Pt is pleasant, cooperative, and engages well during assessments this evening. Pt. Reports she is tired this evening from trying to exercise more. Pt. Still complains of blood pressure dropping with PT. Pt. Denies pain this evening and reports no side effects of dialysis. Pt. Dialysis fistula WDL. Pt. Isolative and withdrawn to her room resting this evening, but was observed during beginning of shift for awhile visiting with family and friends out in the milieu. Pt. Eye contact and speech is improved and organized this evening with this Probation officer and staff.   A: Q x 15 minute observation checks were completed for safety. Patient was provided with education, needs reinforcement though.  Patient was given/offered medications per orders. Patient  was encourage to attend groups, participate in unit activities and continue with plan of care. Pt. Chart and plans of care reviewed. Pt. Given support and encouragement.   R: Patient is complaint with medication and unit procedures. Pt. Given extensive falls risk education for safety. Pt. Monitored with 1:1 for safety. Pt. Does not attend snack or groups this evening.             Precautionary checks every 15 minutes for safety maintained, room free of safety hazards, patient sustains no injury or falls during this shift. Will endorse care to next shift.

## 2018-03-16 NOTE — Progress Notes (Signed)
Physical Therapy Treatment Patient Details Name: Joyce [name] MRN: [id number] DOB: Dec 09, 1945 Today's Date: 03/16/2018    History of Present Illness 72 y.o. female recently admitted inpt with acute respiratory failure with hypoxia  that was possibly due to drug overdose. Pt was intubated on 8/15, became responsive on 8/18, and was extubated 8/19. Pt had been d/c from psychiatric care just before being addmitted to hospital.   Now is re-evaluated on behavioral med unit for PT and mobility.  PMH includes CKD, bipolar disorder, diverticulitis, hiatal hernia, HTN, and vertigo.     PT Comments    Pt resting in bed and ready for session.  Stated she has been walking on unit with staff and walker without difficulty.  Sitter confirms.  When questioned regarding dizziness yesterday she attributed it to dialysis session and "I was having a bad day."  She was able to transition in and out of bed without difficulty and no dizziness.  She stood with supervision and was able to walk 200' with walker and min guard/supervision.  Some unsteadiness but overall did well.  After seated rest she participated in exercises as below.  She then was able to walk and additional 200' without AD.  Gait was significantly slower, more unsteady and overall more cautious.  While she hd no imbalances that required intervention by Probation officer, she does need to use RW upon discharge for general safety until strength and overall mobility improve.  She agreed and stated she hoped she would only need walker temporarily which I think is realistic.    Due to overall mobility, will update discharge recommendations at this time.  She may need +1 assist after dialysis for general safety as she stated at times she "feels bad" after but overall she is progressing well.   Follow Up Recommendations  Home health PT;Supervision - Intermittent     Equipment Recommendations  Rolling walker with 5" wheels    Recommendations for Other Services        Precautions / Restrictions Precautions Precautions: Fall Precaution Comments: Orthostatic - on dialysis days per pt Restrictions Weight Bearing Restrictions: No    Mobility  Bed Mobility Overal bed mobility: Modified Independent                Transfers Overall transfer level: Needs assistance Equipment used: Rolling walker (2 wheeled);None Transfers: Sit to/from Stand Sit to Stand: Supervision            Ambulation/Gait Ambulation/Gait assistance: Min guard;Min assist Gait Distance (Feet): 200 Feet Assistive device: Standard walker;None Gait Pattern/deviations: Step-through pattern;Decreased step length - right;Decreased step length - left;Narrow base of support Gait velocity: decreased        Stairs             Wheelchair Mobility    Modified Rankin (Stroke Patients Only)       Balance Overall balance assessment: Needs assistance Sitting-balance support: Feet supported Sitting balance-Leahy Scale: Good     Standing balance support: No upper extremity supported Standing balance-Leahy Scale: Poor Standing balance comment: does well with walker but has more difficulty without UE support but able to correct imbalances without assist.                            Cognition Arousal/Alertness: Awake/alert Behavior During Therapy: WFL for tasks assessed/performed Overall Cognitive Status: Within Functional Limits for tasks assessed  Exercises Other Exercises Other Exercises: heel raises, SLR and marches x 10 in standing with walker for support as needed.    General Comments        Pertinent Vitals/Pain Pain Assessment: No/denies pain    Home Living                      Prior Function            PT Goals (current goals can now be found in the care plan section) Progress towards PT goals: Progressing toward goals    Frequency    Min 2X/week       PT Plan Discharge plan needs to be updated    Co-evaluation              AM-PAC PT "6 Clicks" Daily Activity  Outcome Measure  Difficulty turning over in bed (including adjusting bedclothes, sheets and blankets)?: None Difficulty moving from lying on back to sitting on the side of the bed? : None Difficulty sitting down on and standing up from a chair with arms (e.g., wheelchair, bedside commode, etc,.)?: None Help needed moving to and from a bed to chair (including a wheelchair)?: None Help needed walking in hospital room?: A Little Help needed climbing 3-5 steps with a railing? : A Little 6 Click Score: 22    End of Session Equipment Utilized During Treatment: Gait belt Activity Tolerance: Patient tolerated treatment well Patient left: in bed;with nursing/sitter in room         Time: 1100-1120 PT Time Calculation (min) (ACUTE ONLY): 20 min  Charges:  $Gait Training: 8-22 mins                     Chesley Noon, PTA 03/16/18, 11:35 AM

## 2018-03-16 NOTE — Progress Notes (Signed)
Recreation Therapy Notes   Date: 03/16/2018  Time: 9:30 am  Location: Craft Room  Behavioral response: Appropriate   Intervention Topic: Communication  Discussion/Intervention:  Group content today was focused on communication. The group defined communication and ways to communicate with others. Individuals stated reason why communication is important and some reasons to communicate with others. Patients expressed if they thought they were good at communicating with others and ways they could improve their communication skills. The group identified important parts of communication and some experiences they have had in the past with communication. The group participated in the intervention "What is that?", where they had a chance to test out their communication skills and identify ways to improve their communication techniques.  .  Clinical Observations/Feedback:  Patient attended group and was focused on what peers and staff had to say about the topic at hand. Individual was social with peers and staff while participating in the intervention.  Lotoya Casella LRT/CTRS         Ilhan Debenedetto 03/16/2018 11:43 AM

## 2018-03-16 NOTE — BHH Group Notes (Signed)
[place] Group Notes:  (Nursing/MHT/Case Management/Adjunct)  Date:  03/16/2018  Time:  8:51 PM  Type of Therapy:  Group Therapy  Participation Level:  Did Not Attend    Summary of Progress/Problems:  [name] 03/16/2018, 8:51 PM

## 2018-03-16 NOTE — Plan of Care (Signed)
Pt. Verbalizes understanding of provided education. Pt. Mood is pleasant and engaging. Pt. Very goal oriented. Pt. Complaint with medications. Pt. Denies SI/HI and verbally is able to contract for safety.    Problem: Education: Goal: Knowledge of [place] [facility] Education information/materials will improve Outcome: Progressing Goal: Emotional status will improve Outcome: Progressing Goal: Mental status will improve Outcome: Progressing   Problem: Health Behavior/Discharge Planning: Goal: Compliance with therapeutic regimen will improve Outcome: Progressing   Problem: Self-Concept: Goal: Ability to disclose and discuss suicidal ideas will improve Outcome: Progressing

## 2018-03-16 NOTE — [facility] Counselor (Signed)
Patient has a PASSAR Number for Level II Determination limited to 30 days. Approved on 03/16/2018 and expires on 04/15/2018. PASSAR Number is [id number].   Darin Engels, MSW, Latanya Presser, [place] Clinical Social Worker 03/16/2018 2:27 PM

## 2018-03-16 NOTE — Plan of Care (Signed)
Patient verbalizes understanding of the general information that's been provided to her and all questions/concerns have been addressed and answered at this time. Patient denies SI/HI/AVH. Patient verbalizes understanding of and has been in compliance with her prescribed therapeutic regimen and all questions/concerns have been addressed and answered at this time. Patient denies depression stating to this writing "I'm feeling blessed", and she states that she's a little antsy this morning because "I'm trying to be more patient". Patient has the ability to make decisions for herself and her goal is to "walk more with my legs" in which she has been observed walking around the unit with her assigned safety sitter as well as with PT without the walker without any issues at thus far. Patient remains safe on the unit at this time.   Problem: Education: Goal: Knowledge of [facility] Education information/materials will improve 03/16/2018 1857 by Lyda Kalata, RN Outcome: Progressing 03/16/2018 1036 by Lyda Kalata, RN Outcome: Progressing Goal: Emotional status will improve 03/16/2018 1857 by Lyda Kalata, RN Outcome: Progressing 03/16/2018 1036 by Lyda Kalata, RN Outcome: Progressing Goal: Mental status will improve 03/16/2018 1857 by Lyda Kalata, RN Outcome: Progressing 03/16/2018 1036 by Lyda Kalata, RN Outcome: Progressing Goal: Verbalization of understanding the information provided will improve 03/16/2018 1857 by Lyda Kalata, RN Outcome: Progressing 03/16/2018 1036 by Lyda Kalata, RN Outcome: Progressing   Problem: Education: Goal: Utilization of techniques to improve thought processes will improve 03/16/2018 1857 by Lyda Kalata, RN Outcome: Progressing 03/16/2018 1036 by Lyda Kalata, RN Outcome: Progressing Goal: Knowledge of the prescribed therapeutic regimen will improve 03/16/2018 1857 by Lyda Kalata, RN Outcome:  Progressing 03/16/2018 1036 by Lyda Kalata, RN Outcome: Progressing   Problem: Activity: Goal: Interest or engagement in leisure activities will improve 03/16/2018 1857 by Lyda Kalata, RN Outcome: Progressing 03/16/2018 1036 by Lyda Kalata, RN Outcome: Progressing   Problem: Coping: Goal: Coping ability will improve 03/16/2018 1857 by Lyda Kalata, RN Outcome: Progressing 03/16/2018 1036 by Lyda Kalata, RN Outcome: Progressing Goal: Will verbalize feelings 03/16/2018 1857 by Lyda Kalata, RN Outcome: Progressing 03/16/2018 1036 by Lyda Kalata, RN Outcome: Progressing   Problem: Health Behavior/Discharge Planning: Goal: Compliance with therapeutic regimen will improve 03/16/2018 1857 by Lyda Kalata, RN Outcome: Progressing 03/16/2018 1036 by Lyda Kalata, RN Outcome: Progressing   Problem: Education: Goal: Ability to make informed decisions regarding treatment will improve 03/16/2018 1857 by Lyda Kalata, RN Outcome: Progressing 03/16/2018 1036 by Lyda Kalata, RN Outcome: Progressing   Problem: Self-Concept: Goal: Ability to disclose and discuss suicidal ideas will improve 03/16/2018 1857 by Lyda Kalata, RN Outcome: Progressing 03/16/2018 1036 by Lyda Kalata, RN Outcome: Progressing Goal: Will verbalize positive feelings about self 03/16/2018 1857 by Lyda Kalata, RN Outcome: Progressing 03/16/2018 1036 by Lyda Kalata, RN Outcome: Progressing

## 2018-03-16 NOTE — Plan of Care (Addendum)
Patient found in day room with sitter at side. Compliant with 1:1 sitter. Visible but not social this evening. Reports feeling "fine." Affect is blunted. Patient processing is impaired, mildly confused but pleasant. Reports eating and voiding adequately. Denies SI/HI/AVH. Denies anxiety and depression. Denies pain. Compliant with HS medications and staff direction. Will continue to monitor throughout the shift. Patient slept 8.25 hours. No apparent distress. Reports sleeping well. Remains compliant with 1:1 sitter. Patient compliant with VS. VS elevated. Dr. Weber Cooks notified. Instructed to administer scheduled HTN medications on time. Will endorse care to oncoming shift.   Problem: Education: Goal: Emotional status will improve Outcome: Progressing Goal: Mental status will improve Outcome: Progressing   Problem: Activity: Goal: Interest or engagement in leisure activities will improve Outcome: Progressing   Problem: Coping: Goal: Coping ability will improve Outcome: Progressing Goal: Will verbalize feelings Outcome: Progressing   Problem: Health Behavior/Discharge Planning: Goal: Compliance with therapeutic regimen will improve Outcome: Progressing   Problem: Education: Goal: Knowledge of [place] [facility] Education information/materials will improve Outcome: Not Progressing Goal: Verbalization of understanding the information provided will improve Outcome: Not Progressing   Problem: Education: Goal: Knowledge of the prescribed therapeutic regimen will improve Outcome: Not Progressing

## 2018-03-16 NOTE — Progress Notes (Signed)
D- Patient alert and oriented. Patient presents in a pleasant mood on assessment reporting that she slept fair last night stating "I woke up at 11 pm thinking it was 6 am, but I went back to sleep". Patient denies SI, HI, AVH, and pain at this time. Patient denies any depression stating "I'm feeling blessed". Patient also states that she is feeling "antsy" this morning "trying to be patient". Patient's goal for today is to "work on my leg to get rid of walker".   A- Scheduled medications administered to patient, per MD orders. Support and encouragement provided.  Routine safety checks conducted every 15 minutes.  Patient informed to notify staff with problems or concerns.  R- No adverse drug reactions noted. Patient contracts for safety at this time. Patient compliant with medications and treatment plan. Patient receptive, calm, and cooperative. Patient interacts well with others on the unit.  Patient remains safe at this time.

## 2018-03-16 NOTE — Progress Notes (Signed)
1:1 observation Notes  1900 Pt. Observed in her room with 1:1 present for safety. No distress noted.  2000 Pt. Observed in community room interacting with visitors. No distress noted. Pt. With 1:1 for safety  2100 Pt. Observed in community room interacting with visitors. No distress noted. Pt. With 1:1 for safety  2200 Pt. Observed resting in her room with no distress noted. Respiration unlabored equal. 1:1 for safety 2300 Pt. Observed resting in her room with no distress noted. Respiration unlabored equal. 1:1 for safety 0000 Pt. Observed resting in her room with no distress noted. Respiration unlabored equal. 1:1 for safety    0100  Pt. Observed resting in her room with no distress noted. Respiration unlabored equal. 1:1 for safety 0200 Pt. Observed resting in her room with no distress noted. Respiration unlabored equal. 1:1 for safety 0300  Pt. Observed resting in her room with no distress noted. Respiration unlabored equal. 1:1 for safety 0400 Pt. Observed resting in her room with no distress noted. Respiration unlabored equal. 1:1 for safety 0500 Pt. Observed resting in her room with no distress noted. Respiration unlabored equal. 1:1 for safety 0600  Pt. Observed resting in her room with no distress noted. Respiration unlabored equal. 1:1 for safety 0700 Pt. Observed resting in her room with no distress noted. Respiration unlabored equal. 1:1 for safety    Safety precautions implemented per MD orders. Will endorse care to next shift.

## 2018-03-16 NOTE — [facility] Group Notes (Signed)
03/16/2018 1PM  Type of Therapy/Topic:  Group Therapy:  Feelings about Diagnosis  Participation Level:  Did Not Attend   Description of Group:   This group will allow patients to explore their thoughts and feelings about diagnoses they have received. Patients will be guided to explore their level of understanding and acceptance of these diagnoses. Facilitator will encourage patients to process their thoughts and feelings about the reactions of others to their diagnosis and will guide patients in identifying ways to discuss their diagnosis with significant others in their lives. This group will be process-oriented, with patients participating in exploration of their own experiences, giving and receiving support, and processing challenge from other group members.   Therapeutic Goals: 1. Patient will demonstrate understanding of diagnosis as evidenced by identifying two or more symptoms of the disorder 2. Patient will be able to express two feelings regarding the diagnosis 3. Patient will demonstrate their ability to communicate their needs through discussion and/or role play  Summary of Patient Progress: Patient was encouraged and invited to attend group. Patient did not attend group. Social worker will continue to encourage group participation in the future.        Therapeutic Modalities:   Cognitive Behavioral Therapy Brief Therapy Feelings Identification    Darin Engels, LCSW 03/16/2018 2:15 PM

## 2018-03-16 NOTE — Progress Notes (Addendum)
1:1 hourly note Please see Plan of Care note for shift note.  2000- Patient in day room with sitter at side. No apparent distress. Reports feeling "fine." 2100- Patient returns to bed. Drank Ensure. Refused snack. Compliant with HS medication. Sitter at bedside. 2200- Patient sleeping. Sitter at bedside. No apparent distress.  2300- Patient sleeping. Sitter at bedside. No apparent distress.  0000- Patient sleeping. Sitter at bedside. No apparent distress.  0100- Patient sleeping. Sitter at bedside. No apparent distress.  0200- Patient sleeping. Sitter at bedside. No apparent distress.  0300- Patient sleeping. Sitter at bedside. No apparent distress. 0400- Patient sleeping. Sitter at bedside. No apparent distress. 0500- Patient sleeping. Sitter at bedside. No apparent distress. 0600- Patient VS elevated. Dr. Weber Cooks notified. Instructed to give HTN medication at scheduled time. Patient awake in bed. Sitter at bedside. 0700- Patient awake in common area. Sitter at side.

## 2018-03-17 ENCOUNTER — Other Ambulatory Visit: Payer: Self-pay | Admitting: *Deleted

## 2018-03-17 LAB — CARBAMAZEPINE LEVEL, TOTAL: Carbamazepine Lvl: 3.7 ug/mL — ABNORMAL LOW (ref 4.0–12.0)

## 2018-03-17 MED ORDER — CARBAMAZEPINE 200 MG PO TABS
400.0000 mg | ORAL_TABLET | Freq: Three times a day (TID) | ORAL | Status: DC
  Administered 2018-03-18 (×2): 400 mg via ORAL
  Filled 2018-03-17 (×2): qty 2

## 2018-03-17 MED ORDER — PENTAFLUOROPROP-TETRAFLUOROETH EX AERO
INHALATION_SPRAY | CUTANEOUS | Status: DC | PRN
  Filled 2018-03-17: qty 103.5

## 2018-03-17 MED ORDER — EPOETIN ALFA 10000 UNIT/ML IJ SOLN
10000.0000 [IU] | INTRAMUSCULAR | Status: DC
  Administered 2018-03-17 – 2018-03-19 (×2): 10000 [IU] via INTRAVENOUS
  Filled 2018-03-17: qty 1

## 2018-03-17 NOTE — [facility] Counselor (Signed)
CSW attempted to contact Joyce [name] [phone]) the daughter-in-law if the patient to discuss discharge plans. CSW left a voicemail with a callback number and will continue to reach out to [place].  Darin Engels, MSW, Latanya Presser, [place] Clinical Social Worker 03/17/2018 1:45 PM

## 2018-03-17 NOTE — Progress Notes (Signed)
Recreation Therapy Notes  Date: 03/17/2018  Time: 9:30 am  Location: Craft Room  Behavioral response: Appropriate   Intervention Topic: Emotions  Discussion/Intervention:  Group content on today was focused on emotions. The group identified what emotions are and why it is important to have emotions. Patients expressed some positive and negative emotions. Individuals gave some past experiences on how they normally dealt with emotions in the past. The group described some positive ways to deal with emotions in the future. Patients participated in the intervention "The Situation" where individuals were given a chance to respond to certain situations involving their emotions.  Clinical Observations/Feedback:  Patient attended group and was focused on what peers and staff had to say about the topic at hand. Individual participated in the intervention and was upbeat.  Nimo Verastegui LRT/CTRS         Undrea Shipes 03/17/2018 11:29 AM

## 2018-03-17 NOTE — Progress Notes (Signed)
D: Pt denies SI/HI/AVH, contracts verbally for safety. Pt is pleasant and cooperative, with more active presence around the unit and at groups. Pt. Eats good snack. Pt. has no complaints, reports doing better.  Patient Interaction appropriate with staff and peers. Pt. Has been working on being more active with good progression.   A: Q x 15 minute observation checks were completed for safety. Patient was provided with education, needs reinforcement.  Patient was given/offered medications per orders. Patient  was encourage to attend groups, participate in unit activities and continue with plan of care. Pt. Chart and plans of care reviewed. Pt. Given support and encouragement.   R: Patient is complaint with medication and unit procedures. Pt. Given extensive falls risk safety education. Pt. Utilizes 1:1 with gait belt for safety. Pt. Monitored with 1:1 for safety. Pt. denies pain, but later in the shift reported a small headache. Pt. Given PRN medications for pain per orders. Pt. Dialysis fistula WDL.             Precautionary checks every 15 minutes for safety maintained, room free of safety hazards, patient sustains no injury or falls during this shift. Will endorse care to next shift.

## 2018-03-17 NOTE — Progress Notes (Signed)
Pre HD Treatment    03/17/18 1009  Vital Signs  Temp 98.1 F (36.7 C)  Temp Source Oral  Pulse Rate 68  Pulse Rate Source Monitor  Resp 16  BP (!) 119/51  BP Location Right Arm  BP Method Automatic  Patient Position (if appropriate) Sitting  Oxygen Therapy  SpO2 100 %  O2 Device Room Air  Pain Assessment  Pain Scale 0-10  Pain Score 0  Dialysis Weight  Weight 68.1 kg  Type of Weight Pre-Dialysis  Time-Out for Hemodialysis  What Procedure? HD  Pt Identifiers(min of two) First/Last Name;MRN/Account#;Pt's DOB(use if MRN/Acct# not available  Correct Site? Yes  Correct Side? Yes  Correct Procedure? Yes  Consents Verified? Yes  Rad Studies Available? N/A  Safety Precautions Reviewed? Yes  Engineer, civil (consulting) Number [phone] ([id number])  Station Number 3  UF/Alarm Test Passed  Conductivity: Meter 14  Conductivity: Machine  14.1  pH 7.6  Reverse Osmosis Main  Normal Saline Lot Number [id number]  Dialyzer Lot Number [id number]  Disposable Set Lot Number [id number]  Machine Temperature 98.6 F (37 C)  Musician and Audible Yes  Blood Lines Intact and Secured Yes  Pre Treatment Patient Checks  Vascular access used during treatment Fistula  Patient is receiving dialysis in a chair Yes  Hepatitis B Surface Antigen Results Negative  Date Hepatitis B Surface Antigen Drawn 01/06/18  Hepatitis B Surface Antibody 17  Date Hepatitis B Surface Antibody Drawn 02/24/18  Hemodialysis Consent Verified Yes  Hemodialysis Standing Orders Initiated Yes  ECG (Telemetry) Monitor On No  Prime Ordered Normal Saline  Length of  DialysisTreatment -hour(s) 3.5 Hour(s)  Dialyzer Elisio 17H NR  Dialysate 3K, 2.5 Ca  Variable Sodium Other (Comment)  Dialysis Anticoagulant None  Dialysate Flow Ordered 600  Blood Flow Rate Ordered 400 mL/min  Ultrafiltration Goal 2 Liters  Pre Treatment Labs CBC;Phosphorus  Dialysis Blood Pressure Support Ordered Normal Saline  Education / Care Plan  Dialysis  Education Provided Yes  Documented Education in Care Plan Yes  Fistula / Graft Left Upper arm Arteriovenous fistula  Placement Date: 08/13/15   Placed prior to admission: Yes  Orientation: Left  Access Location: Upper arm  Access Type: Arteriovenous fistula  Site Condition No complications  Fistula / Graft Assessment Present;Thrill;Bruit  Drainage Description None

## 2018-03-17 NOTE — Progress Notes (Signed)
HD tx completed, uf goal met, tolerated well.    03/17/18 1355  Vital Signs  Pulse Rate 70  Pulse Rate Source Monitor  Resp 17  BP (!) 119/52  BP Location Right Arm  BP Method Automatic  Patient Position (if appropriate) Sitting  Oxygen Therapy  SpO2 100 %  O2 Device Room Air  Pulse Oximetry Type Continuous  During Hemodialysis Assessment  HD Safety Checks Performed Yes  KECN 77.1 KECN  Dialysis Fluid Bolus Normal Saline  Bolus Amount (mL) 250 mL  Intra-Hemodialysis Comments Tx completed;Tolerated well

## 2018-03-17 NOTE — Tx Team (Signed)
Interdisciplinary Treatment and Diagnostic Plan Update  03/17/2018 Time of Session: 10:30am Joyce [name] MRN: [id number]  Principal Diagnosis: Bipolar I disorder, current or most recent episode depressed, with psychotic features ([place])  Secondary Diagnoses: Principal Problem:   Bipolar I disorder, current or most recent episode depressed, with psychotic features ([place]) Active Problems:   HTN (hypertension)   ESRD on dialysis ([place])   Suicide attempt ([place])   Current Medications:  Current Facility-Administered Medications  Medication Dose Route Frequency Provider Last Rate Last Dose  . acetaminophen (TYLENOL) tablet 650 mg  650 mg Oral Q6H PRN Clapacs, [name], MD      . alum & mag hydroxide-simeth (MAALOX/MYLANTA) 200-200-20 MG/5ML suspension 30 mL  30 mL Oral Q4H PRN Clapacs, [name], MD   30 mL at 03/14/18 1733  . brimonidine (ALPHAGAN) 0.2 % ophthalmic solution 1 drop  1 drop Both Eyes BID Clapacs, [name], MD   1 drop at 03/17/18 0830  . carbamazepine (TEGRETOL) tablet 200 mg  200 mg Oral TID Clapacs, [name], MD   Stopped at 03/17/18 [phone]  . dicyclomine (BENTYL) tablet 20 mg  20 mg Oral TID AC Clapacs, [name], MD   Stopped at 03/17/18 [phone]  . epoetin alfa (EPOGEN,PROCRIT) injection 10,000 Units  10,000 Units Intravenous Q M,W,F-HD Kolluru, Sarath, MD      . famotidine (PEPCID) tablet 20 mg  20 mg Oral Daily Clapacs, [name], MD   Stopped at 03/17/18 0840  . feeding supplement (NEPRO CARB STEADY) liquid 237 mL  237 mL Oral BID BM Clapacs, [name], MD   237 mL at 03/17/18 1100  . magnesium hydroxide (MILK OF MAGNESIA) suspension 30 mL  30 mL Oral Daily PRN Clapacs, [name], MD      . metoprolol tartrate (LOPRESSOR) tablet 25 mg  25 mg Oral BID Clapacs, [name], MD   Stopped at 03/17/18 [phone]  . multivitamin (RENA-VIT) tablet 1 tablet  1 tablet Oral QHS [name], [name], MD   1 tablet at 03/16/18 2038  . pentafluoroprop-tetrafluoroeth (GEBAUERS) aerosol   Topical PRN Clapacs, [name], MD       . pentafluoroprop-tetrafluoroeth (GEBAUERS) aerosol   Topical PRN Kolluru, Lurena Nida, MD      . rOPINIRole (REQUIP) tablet 2 mg  2 mg Oral Daily Clapacs, [name], MD   Stopped at 03/17/18 [phone]  . timolol (TIMOPTIC) 0.5 % ophthalmic solution 1 drop  1 drop Both Eyes BID Clapacs, [name], MD   1 drop at 03/17/18 0830  . vitamin C (ASCORBIC ACID) tablet 250 mg  250 mg Oral BID Orson Slick B, MD   Stopped at 03/17/18 0849  . ziprasidone (GEODON) capsule 60 mg  60 mg Oral BID WC [name], [name], MD   Stopped at 03/17/18 0849   PTA Medications: Medications Prior to Admission  Medication Sig Dispense Refill Last Dose  . acetaminophen (TYLENOL) 325 MG tablet Take 325 mg 2 (two) times daily as needed by mouth for moderate pain or headache.    PRN at PRN  . brimonidine (ALPHAGAN) 0.2 % ophthalmic solution Place 1 drop into both eyes 2 (two) times daily. 5 mL 1 Unknown at Unknown  . carbamazepine (TEGRETOL) 200 MG tablet Take 1 tablet (200 mg total) by mouth 3 (three) times daily. 90 tablet 3   . cholecalciferol (VITAMIN D) 1000 UNITS tablet Take 1,000 Units by mouth daily.   Unknown at Unknown  . cyanocobalamin (,VITAMIN B-12,) 1000 MCG/ML injection Inject 1,000  mcg into the muscle every 30 (thirty) days.   Unknown at Unknown  . dicyclomine (BENTYL) 20 MG tablet Take 1 tablet (20 mg total) by mouth 3 (three) times daily before meals. 90 tablet 1 Unknown at Unknown  . famotidine (PEPCID) 20 MG tablet TAKE 1 TABLET BY MOUTH EVERY DAY 30 tablet 1 Unknown at Unknown  . haloperidol (HALDOL) 5 MG tablet Take 0.5 tablets (2.5 mg total) by mouth every Monday, Wednesday, and Friday with hemodialysis AND 1 tablet (5 mg total) at bedtime. 36 tablet 3   . lidocaine (LIDODERM) 5 % Place 1 patch onto the skin daily. Remove & Discard patch within 12 hours or as directed by MD (Patient not taking: Reported on 02/26/2018) 30 patch 0 Not Taking at Unknown time  . lidocaine-prilocaine (EMLA) cream Apply 1  application every Monday, Wednesday, and Friday topically. At dialysis  3 PRN at PRN  . metoprolol tartrate (LOPRESSOR) 25 MG tablet Take 1 tablet (25 mg total) by mouth 2 (two) times daily. 60 tablet 1   . multivitamin (RENA-VIT) TABS tablet TAKE 1 TABLET BY MOUTH EVERYDAY AT BEDTIME (Patient taking differently: No sig reported) 30 tablet 0 Unknown at Unknown  . rOPINIRole (REQUIP) 2 MG tablet Take 1 tablet (2 mg total) by mouth daily. 30 tablet 3   . temazepam (RESTORIL) 15 MG capsule Take 1 capsule (15 mg total) by mouth at bedtime as needed for sleep. 30 capsule 0 PRN at PRN  . timolol (TIMOPTIC) 0.5 % ophthalmic solution Place 1 drop into both eyes 2 (two) times daily. 10 mL 1 Unknown at Unknown    Patient Stressors: Health problems Loss of spouse  Patient Strengths: Scientist, research (life sciences) Supportive family/friends  Treatment Modalities: Medication Management, Group therapy, Case management,  1 to 1 session with clinician, Psychoeducation, Recreational therapy.   Physician Treatment Plan for Primary Diagnosis: Bipolar I disorder, current or most recent episode depressed, with psychotic features ([place]) Long Term Goal(s): Improvement in symptoms so as ready for discharge NA   Short Term Goals: Ability to identify changes in lifestyle to reduce recurrence of condition will improve Ability to verbalize feelings will improve Ability to disclose and discuss suicidal ideas Ability to demonstrate self-control will improve Ability to identify and develop effective coping behaviors will improve Ability to maintain clinical measurements within normal limits will improve Compliance with prescribed medications will improve Ability to identify triggers associated with substance abuse/mental health issues will improve NA  Medication Management: Evaluate patient's response, side effects, and tolerance of medication regimen.  Therapeutic Interventions: 1 to 1 sessions, Unit Group sessions and  Medication administration.  Evaluation of Outcomes: Progressing  Physician Treatment Plan for Secondary Diagnosis: Principal Problem:   Bipolar I disorder, current or most recent episode depressed, with psychotic features ([place]) Active Problems:   HTN (hypertension)   ESRD on dialysis ([place])   Suicide attempt ([place])  Long Term Goal(s): Improvement in symptoms so as ready for discharge NA   Short Term Goals: Ability to identify changes in lifestyle to reduce recurrence of condition will improve Ability to verbalize feelings will improve Ability to disclose and discuss suicidal ideas Ability to demonstrate self-control will improve Ability to identify and develop effective coping behaviors will improve Ability to maintain clinical measurements within normal limits will improve Compliance with prescribed medications will improve Ability to identify triggers associated with substance abuse/mental health issues will improve NA     Medication Management: Evaluate patient's response, side effects, and tolerance of medication regimen.  Therapeutic Interventions: 1 to 1 sessions, Unit Group sessions and Medication administration.  Evaluation of Outcomes: Progressing   RN Treatment Plan for Primary Diagnosis: Bipolar I disorder, current or most recent episode depressed, with psychotic features ([place]) Long Term Goal(s): Knowledge of disease and therapeutic regimen to maintain health will improve  Short Term Goals: Ability to remain free from injury will improve, Ability to participate in decision making will improve, Ability to disclose and discuss suicidal ideas and Ability to identify and develop effective coping behaviors will improve  Medication Management: RN will administer medications as ordered by provider, will assess and evaluate patient's response and provide education to patient for prescribed medication. RN will report any adverse and/or side effects to prescribing  provider.  Therapeutic Interventions: 1 on 1 counseling sessions, Psychoeducation, Medication administration, Evaluate responses to treatment, Monitor vital signs and CBGs as ordered, Perform/monitor CIWA, COWS, AIMS and Fall Risk screenings as ordered, Perform wound care treatments as ordered.  Evaluation of Outcomes: Progressing   LCSW Treatment Plan for Primary Diagnosis: Bipolar I disorder, current or most recent episode depressed, with psychotic features () Long Term Goal(s): Safe transition to appropriate next level of care at discharge, Engage patient in therapeutic group addressing interpersonal concerns.  Short Term Goals: Engage patient in aftercare planning with referrals and resources, Increase emotional regulation, Identify triggers associated with mental health/substance abuse issues and Increase skills for wellness and recovery  Therapeutic Interventions: Assess for all discharge needs, 1 to 1 time with Social worker, Explore available resources and support systems, Assess for adequacy in community support network, Educate family and significant other(s) on suicide prevention, Complete Psychosocial Assessment, Interpersonal group therapy.  Evaluation of Outcomes: Progressing   Progress in Treatment: Attending groups: Yes. Participating in groups: Yes. Taking medication as prescribed: Yes. Toleration medication: Yes. Family/Significant other contact made: No, will contact:  Family Contact Patient understands diagnosis: Yes. Discussing patient identified problems/goals with staff: Yes. Medical problems stabilized or resolved: Yes. Denies suicidal/homicidal ideation: Yes. Issues/concerns per patient self-inventory: No. Other:   New problem(s) identified: No, Describe:  None  New Short Term/Long Term Goal(s): "When I go home, I will be ready and not asked to go home. I will be ready."  Patient Goals:  "When I go home, I will be ready and not asked to go home. I will be  ready."  Discharge Plan or Barriers: To either return home with her son or go to a nursing facility and follow up with outpatient.   Reason for Continuation of Hospitalization: Medication stabilization  Estimated Length of Stay: 2-3 days  Attendees: Patient:  03/17/2018 11:28 AM  Physician: Dr. Bary Leriche, MD 03/17/2018 11:28 AM  Nursing: Polly Cobia, RN 03/17/2018 11:28 AM  RN Care Manager: 03/17/2018 11:28 AM  Social Worker: Darin Engels, LCSW 03/17/2018 11:28 AM  Recreational Therapist: Isaias Sakai. Marcello Fennel, LRT 03/17/2018 11:28 AM  Other: Dossie Arbour, LCSW 03/17/2018 11:28 AM  Other:  03/17/2018 11:28 AM  Other:  03/17/2018 11:28 AM    Scribe for Treatment Team: Darin Engels, LCSW 03/17/2018 11:28 AM

## 2018-03-17 NOTE — Progress Notes (Signed)
[facility]  ROUNDING NOTE   Subjective:   Seen and examined on hemodialysis treatment. Tolerating treatment well. UF goal of 2 liters.  Seated in chair.    HEMODIALYSIS FLOWSHEET:  Blood Flow Rate (mL/min): 400 mL/min Arterial Pressure (mmHg): -200 mmHg Venous Pressure (mmHg): 200 mmHg Transmembrane Pressure (mmHg): 70 mmHg Ultrafiltration Rate (mL/min): 720 mL/min Dialysate Flow Rate (mL/min): 600 ml/min Conductivity: Machine : 14 Conductivity: Machine : 14 Dialysis Fluid Bolus: Normal Saline Bolus Amount (mL): 250 mL    Objective:  Vital signs in last 24 hours:  Temp:  [98.1 F (36.7 C)-98.4 F (36.9 C)] 98.1 F (36.7 C) (08/28 1009) Pulse Rate:  [60-102] 70 (08/28 1330) Resp:  [13-20] 16 (08/28 1330) BP: (106-151)/(40-97) 114/60 (08/28 1330) SpO2:  [98 %-100 %] 100 % (08/28 1330) Weight:  [68.1 kg] 68.1 kg (08/28 1009)  Weight change:  Filed Weights   03/15/18 1036 03/15/18 1445 03/17/18 1009  Weight: 67.9 kg 66 kg 68.1 kg    Intake/Output: I/O last 3 completed shifts: In: 600 [P.O.:600] Out: -    Intake/Output this shift:  Total I/O In: 480 [P.O.:480] Out: -   Physical Exam: General: NAD, sitting in chair  Head: Normocephalic, atraumatic. Moist oral mucosal membranes  Eyes: Anicteric, PERRL  Neck: Supple, trachea midline  Lungs:  Clear to auscultation  Heart: Regular rate and rhythm  Abdomen:  Soft, nontender,   Extremities:  no peripheral edema.  Neurologic: Nonfocal, moving all four extremities  Skin: No lesions  Access: Left AVF    Basic Metabolic Panel: Recent Labs  Lab 03/13/18 1039 03/15/18 1159  NA  --  132*  K  --  4.1  CL  --  95*  CO2  --  27  GLUCOSE  --  116*  BUN  --  50*  CREATININE  --  5.22*  CALCIUM  --  8.8*  PHOS 4.5 3.3    Liver Function Tests: Recent Labs  Lab 03/15/18 1159  ALBUMIN 3.1*   No results for input(s): LIPASE, AMYLASE in the last 168 hours. No results for input(s): AMMONIA in the  last 168 hours.  CBC: Recent Labs  Lab 03/15/18 1150  WBC 9.2  HGB 8.7*  HCT 24.9*  MCV 97.5  PLT 259    Cardiac Enzymes: No results for input(s): CKTOTAL, CKMB, CKMBINDEX, TROPONINI in the last 168 hours.  BNP: Invalid input(s): POCBNP  CBG: No results for input(s): GLUCAP in the last 168 hours.  Microbiology: Results for orders placed or performed during the hospital encounter of 03/04/18  Urine culture     Status: None   Collection Time: 03/04/18 11:03 AM  Result Value Ref Range Status   Specimen Description   Final    URINE, RANDOM Performed at [facility], [address] [place], [place] [address]    Special Requests   Final    NONE Performed at [facility], [address], [place], [place] [address]    Culture   Final    NO GROWTH Performed at [facility], [place] [address] [place], [place] [address]    Report Status 03/06/2018 FINAL  Final  Blood Cultures (routine x 2)     Status: Abnormal   Collection Time: 03/04/18 11:18 AM  Result Value Ref Range Status   Specimen Description   Final    BLOOD RIGHT ARM Performed at [facility], [place] [address], [place], [place] [address]    Special Requests  Final    BOTTLES DRAWN AEROBIC AND ANAEROBIC Blood Culture adequate volume Performed at [facility], [address] [place], [place] [address]    Culture  Setup Time   Final    GRAM POSITIVE COCCI AEROBIC BOTTLE ONLY CRITICAL RESULT CALLED TO, READ BACK BY AND VERIFIED WITH: DAVID BESANTI ON 03/05/18 AT 0109 JAG    Culture (A)  Final    VIRIDANS STREPTOCOCCUS THE SIGNIFICANCE OF ISOLATING THIS ORGANISM FROM A SINGLE SET OF BLOOD CULTURES WHEN MULTIPLE SETS ARE DRAWN IS UNCERTAIN. PLEASE NOTIFY THE MICROBIOLOGY DEPARTMENT WITHIN ONE WEEK IF SPECIATION AND SENSITIVITIES ARE REQUIRED. Performed at [facility], [place] [address], [place], [place] [address]    Report Status 03/07/2018 FINAL  Final  Blood Culture  ID Panel (Reflexed)     Status: Abnormal   Collection Time: 03/04/18 11:18 AM  Result Value Ref Range Status   Enterococcus species NOT DETECTED NOT DETECTED Final   Listeria monocytogenes NOT DETECTED NOT DETECTED Final   Staphylococcus species NOT DETECTED NOT DETECTED Final   Staphylococcus aureus NOT DETECTED NOT DETECTED Final   Streptococcus species DETECTED (A) NOT DETECTED Final    Comment: Not Enterococcus species, Streptococcus agalactiae, Streptococcus pyogenes, or Streptococcus pneumoniae. CRITICAL RESULT CALLED TO, READ BACK BY AND VERIFIED WITH: DAVID BESANTI ON 03/05/18 AT 0109 BY JAG    Streptococcus agalactiae NOT DETECTED NOT DETECTED Final   Streptococcus pneumoniae NOT DETECTED NOT DETECTED Final   Streptococcus pyogenes NOT DETECTED NOT DETECTED Final   Acinetobacter baumannii NOT DETECTED NOT DETECTED Final   Enterobacteriaceae species NOT DETECTED NOT DETECTED Final   Enterobacter cloacae complex NOT DETECTED NOT DETECTED Final   Escherichia coli NOT DETECTED NOT DETECTED Final   Klebsiella oxytoca NOT DETECTED NOT DETECTED Final   Klebsiella pneumoniae NOT DETECTED NOT DETECTED Final   Proteus species NOT DETECTED NOT DETECTED Final   Serratia marcescens NOT DETECTED NOT DETECTED Final   Haemophilus influenzae NOT DETECTED NOT DETECTED Final   Neisseria meningitidis NOT DETECTED NOT DETECTED Final   Pseudomonas aeruginosa NOT DETECTED NOT DETECTED Final   Candida albicans NOT DETECTED NOT DETECTED Final   Candida glabrata NOT DETECTED NOT DETECTED Final   Candida krusei NOT DETECTED NOT DETECTED Final   Candida parapsilosis NOT DETECTED NOT DETECTED Final   Candida tropicalis NOT DETECTED NOT DETECTED Final    Comment: Performed at [facility], [address] [place], [place] [address]  MRSA PCR Screening     Status: None   Collection Time: 03/04/18 12:37 PM  Result Value Ref Range Status   MRSA by PCR NEGATIVE NEGATIVE Final    Comment:         The GeneXpert MRSA Assay (FDA approved for NASAL specimens only), is one component of a comprehensive MRSA colonization surveillance program. It is not intended to diagnose MRSA infection nor to guide or monitor treatment for MRSA infections. Performed at [facility], [address] [place], [place] [address]   Culture, blood (Routine X 2) w Reflex to ID Panel     Status: None   Collection Time: 03/05/18  2:02 AM  Result Value Ref Range Status   Specimen Description BLOOD RIGHT HAND  Final   Special Requests   Final    BOTTLES DRAWN AEROBIC ONLY Blood Culture results may not be optimal due to an inadequate volume of blood received in culture bottles   Culture   Final    NO GROWTH 5 DAYS Performed  at [facility], [address] [place], [place] [address]    Report Status 03/10/2018 FINAL  Final  Culture, blood (Routine X 2) w Reflex to ID Panel     Status: None   Collection Time: 03/05/18  2:03 AM  Result Value Ref Range Status   Specimen Description BLOOD RIGHT HAND  Final   Special Requests   Final    BOTTLES DRAWN AEROBIC ONLY Blood Culture results may not be optimal due to an inadequate volume of blood received in culture bottles   Culture   Final    NO GROWTH 5 DAYS Performed at [facility], [address], [place], [place] [address]    Report Status 03/10/2018 FINAL  Final    Coagulation Studies: No results for input(s): LABPROT, INR in the last 72 hours.  Urinalysis: No results for input(s): COLORURINE, LABSPEC, PHURINE, GLUCOSEU, HGBUR, BILIRUBINUR, KETONESUR, PROTEINUR, UROBILINOGEN, NITRITE, LEUKOCYTESUR in the last 72 hours.  Invalid input(s): APPERANCEUR    Imaging: No results found.   Medications:    . brimonidine  1 drop Both Eyes BID  . carbamazepine  200 mg Oral TID  . dicyclomine  20 mg Oral TID AC  . epoetin (EPOGEN/PROCRIT) injection  10,000 Units Intravenous Q M,W,F-HD  . famotidine  20 mg Oral Daily  .  feeding supplement (NEPRO CARB STEADY)  237 mL Oral BID BM  . metoprolol tartrate  25 mg Oral BID  . multivitamin  1 tablet Oral QHS  . rOPINIRole  2 mg Oral Daily  . timolol  1 drop Both Eyes BID  . vitamin C  250 mg Oral BID  . ziprasidone  60 mg Oral BID WC   acetaminophen, alum & mag hydroxide-simeth, magnesium hydroxide, pentafluoroprop-tetrafluoroeth, pentafluoroprop-tetrafluoroeth  Assessment/ Plan:  [name] is a 72 y.o. white female with end stage renal disease on hemodialysis, hypertension, bipolar disorder, GERD, polycystic kidney disease  [facility] MWF left AVF  1. End Stage Renal Disease: seen and examined on hemodialysis treatment. Tolerating treatment well. UF of 1.5 liters.  - Continue MWF schedule. - Outpatient planning for [facility] MWF second shift  2. Hypertension:   - metoprolol  3. Anemia of chronic kidney disease:  - EPO with HD treatment  4.  Secondary hyperparathyroidism: phosphorus at goal.  - holding binders.    LOS: 6 Joyce [name] 8/28/20192:07 PM

## 2018-03-17 NOTE — Progress Notes (Signed)
1:1 observation Notes  1900 Pt. Observed in room with 1:1 present for safety. Pt. In no observable distress. 2000 Pt. Observed in bed awake resting with 1:1 present for safety. Pt. In no distress. 2100 Pt. Observed in bed awake resting with 1:1 present for safety. Pt. In no distress. 2200 Pt. Observed in bed awake resting with 1:1 present for safety. Pt. In no distress. 2300 Pt. Observed in bed asleep with no distress noted. Pt. With 1:1 for safety.  0000 Pt. Observed in bed asleep with no distress noted. Pt. With 1:1 for safety. 0100 Pt. Observed in bed asleep with no distress noted. Pt. With 1:1 for safety. 0200 Pt. Observed in bed asleep with no distress noted. Pt. With 1:1 for safety. 0300 Pt. Observed in bed asleep with no distress noted. Pt. With 1:1 for safety. 0400 Pt. Observed in bed asleep with no distress noted. Pt. With 1:1 for safety. 0500 Pt. Observed in bed asleep with no distress noted. Pt. With 1:1 for safety. 0600 Pt. Observed in bed asleep with no distress noted. Pt. With 1:1 for safety. 0700 Pt. Observed in bed awake resting with 1:1 present for safety. Pt. In no distress.   Safety precautions implemented per MD orders. Will endorse care to next shift.

## 2018-03-17 NOTE — Progress Notes (Signed)
1:1 Patient Hourly Rounding  0800: Patient is in the community room eating breakfast with her assigned safety sitter present.  0900: Patient is in her room talking with her assigned Air cabin crew.  1000: Patient is in her room with her assigned safety sitter present.  1100: Patient is off the unit at dialysis with her assigned safety sitter present.  1200: Patient is off the unit at dialysis with her assigned safety sitter present.  1300: Patient is off the unit at dialysis with her assigned safety sitter present.  1400: Patient is off the unit at dialysis with her assigned safety sitter present.  1500: Patient is outside in the courtyard with staff and the other members and her assigned safety sitter present.  1600: Patient is in her room with her assigned safety sitter present.  1700: Patient is in the community room eating dinner with her assigned safety sitter present.  1800: Patient is in the medication room with her nurse and safety sitter present receiving medication.  1900: Patient is in her room, resting, with her assigned safety sitter present.

## 2018-03-17 NOTE — Progress Notes (Signed)
HD Treatment Initiated    03/17/18 1023  During Hemodialysis Assessment  Blood Flow Rate (mL/min) 400 mL/min  Arterial Pressure (mmHg) -160 mmHg  Venous Pressure (mmHg) 180 mmHg  Transmembrane Pressure (mmHg) 70 mmHg  Ultrafiltration Rate (mL/min) 710 mL/min  Dialysate Flow Rate (mL/min) 600 ml/min  Conductivity: Machine  14.1  HD Safety Checks Performed Yes  Dialysis Fluid Bolus Normal Saline  Bolus Amount (mL) 250 mL  Intra-Hemodialysis Comments Tx initiated

## 2018-03-17 NOTE — Patient Outreach (Signed)
[place] [facility]) Care Management  03/17/2018  Joyce [name] [facility] 19-Oct-1945 [id number]   Patient referred to [facility] care management for community follow up post discharge from behavioral health by Dr. Nicolasa Ducking. Patient's EMR reviewed. Referral to an area ACT team recommended if possible.  Phone call to social work lead at behavioral health Shela Leff who agrees with this possibility and will follow up with referral.    [place], [place] Management [phone]

## 2018-03-17 NOTE — Plan of Care (Signed)
Patient verbalizes understanding of the general information that's been provided to her and all questions/concerns have been addressed and answered at this time. Patient denies SI/HI/AVH. Patient verbalizes understanding of and has been in compliance with her prescribed therapeutic regimen and all questions/concerns have been addressed and answered at this time. Patient also denies depression and anxiety. Patient has the ability to make decisions for herself and her goal is to "walk without walker" in which she has been observed walking around the unit with her assigned safety sitter without the walker and has not had any issues thus far. Patient remains safe on the unit at this time.   Problem: Education: Goal: Knowledge of [facility] Education information/materials will improve Outcome: Progressing Goal: Emotional status will improve Outcome: Progressing Goal: Mental status will improve Outcome: Progressing Goal: Verbalization of understanding the information provided will improve Outcome: Progressing   Problem: Education: Goal: Utilization of techniques to improve thought processes will improve Outcome: Progressing Goal: Knowledge of the prescribed therapeutic regimen will improve Outcome: Progressing   Problem: Activity: Goal: Interest or engagement in leisure activities will improve Outcome: Progressing   Problem: Coping: Goal: Coping ability will improve Outcome: Progressing Goal: Will verbalize feelings Outcome: Progressing   Problem: Health Behavior/Discharge Planning: Goal: Compliance with therapeutic regimen will improve Outcome: Progressing   Problem: Education: Goal: Ability to make informed decisions regarding treatment will improve Outcome: Progressing   Problem: Self-Concept: Goal: Ability to disclose and discuss suicidal ideas will improve Outcome: Progressing Goal: Will verbalize positive feelings about self Outcome: Progressing

## 2018-03-17 NOTE — Progress Notes (Signed)
Post HD assessment    03/17/18 1415  Neurological  Level of Consciousness Alert  Orientation Level Oriented X4  Respiratory  Respiratory Pattern Regular  Chest Assessment Chest expansion symmetrical  Bilateral Breath Sounds Clear;Diminished  Cough None  Cardiac  Pulse Regular  Heart Sounds S1, S2  Jugular Venous Distention (JVD) No  ECG Monitor Yes  Cardiac Rhythm NSR  Antiarrhythmic device No  Vascular  R Radial Pulse +2  L Radial Pulse +2  Edema Generalized  Generalized Edema None  Integumentary  Integumentary (WDL) WDL  Musculoskeletal  Musculoskeletal (WDL) X  Generalized Weakness Yes  Gastrointestinal  Bowel Sounds Assessment Active  GU Assessment  Genitourinary (WDL) X (HD patient)  Psychosocial  Psychosocial (WDL) WDL

## 2018-03-17 NOTE — Progress Notes (Signed)
D- Patient alert and oriented. Patient presents in a pleasant mood on assessment stating that she slept "good" last night and did not voice any complaints at this time. Patient denies SI, HI, AVH, and pain at this time. Patient also denies any depression/anxiety. Patient's goal for today is to "walk without walker".  A- Scheduled medications administered to patient, per MD orders. Support and encouragement provided.  Routine safety checks conducted every 15 minutes.  Patient informed to notify staff with problems or concerns.  R- No adverse drug reactions noted. Patient contracts for safety at this time. Patient compliant with medications and treatment plan. Patient receptive, calm, and cooperative. Patient interacts well with others on the unit.  Patient remains safe at this time.

## 2018-03-17 NOTE — Progress Notes (Signed)
Post HD    03/17/18 1400  Hand-Off documentation  Report given to (Full Name) Lyda Kalata, RN   Report received from (Full Name) Harland Aguiniga   Vital Signs  Temp 98.1 F (36.7 C)  Temp Source Oral  Pulse Rate 70  Pulse Rate Source Monitor  Resp 17  BP 124/63  BP Location Right Arm  BP Method Automatic  Patient Position (if appropriate) Sitting  Oxygen Therapy  SpO2 100 %  O2 Device Room Air  Pulse Oximetry Type Continuous  Pain Assessment  Pain Scale 0-10  Pain Score 0  Dialysis Weight  Weight 66 kg  Type of Weight Post-Dialysis  Post-Hemodialysis Assessment  Rinseback Volume (mL) 250 mL  KECN 77.1 V  Dialyzer Clearance Lightly streaked  Duration of HD Treatment -hour(s) 3.5 hour(s)  Hemodialysis Intake (mL) 500 mL  UF Total -Machine (mL) 2505 mL  Net UF (mL) 2005 mL  Tolerated HD Treatment Yes  AVG/AVF Arterial Site Held (minutes) 10 minutes  AVG/AVF Venous Site Held (minutes) 5 minutes  Fistula / Graft Left Upper arm Arteriovenous fistula  Placement Date: 08/13/15   Placed prior to admission: Yes  Orientation: Left  Access Location: Upper arm  Access Type: Arteriovenous fistula  Site Condition No complications  Fistula / Graft Assessment Present;Thrill;Bruit  Status Deaccessed

## 2018-03-17 NOTE — Progress Notes (Signed)
Pre HD Assessment    03/17/18 1009  Neurological  Level of Consciousness Alert  Orientation Level Oriented to person;Oriented to place;Oriented to situation;Disoriented to time  Respiratory  Respiratory Pattern Regular;Unlabored;Symmetrical  Cardiac  Pulse Regular  Heart Sounds S1, S2  Jugular Venous Distention (JVD) No  ECG Monitor Yes  Cardiac Rhythm NSR  Antiarrhythmic device No  Vascular  R Radial Pulse +2  L Radial Pulse +2  Edema Generalized  Generalized Edema None  Integumentary  Integumentary (WDL) WDL  Musculoskeletal  Musculoskeletal (WDL) X  Generalized Weakness Yes  Gastrointestinal  Bowel Sounds Assessment Active  GU Assessment  Genitourinary (WDL) X (HD patient)  Psychosocial  Psychosocial (WDL) WDL

## 2018-03-17 NOTE — Progress Notes (Signed)
[facility] MD Progress Note  03/18/2018 11:34 AM Joyce [name]  MRN:  [id number]  Subjective:    Joyce [name] feels stronger today and is able to discuss discharge plans. She is hoping to be discharged to her own place. Following most recent discharge from psychiatry, she overdosed with intention to die.  Joyce [name] feels physically stronger and has been able to ambulate with a walker. She spoke with her family and knows that they will accepted her back and that independent living is out of the question.   Spoke with Joyce [name], daughter in law wgho will pick her up tomorrow after dialysis with Joyce [name] the son. They requested family meeting prior to discharge. Joyce [name] will make arrangements with dialysis center for next week.   Principal Problem: Bipolar I disorder, current or most recent episode depressed, with psychotic features [facility]) Diagnosis:   Patient Active Problem List   Diagnosis Date Noted  . Bipolar I disorder, current or most recent episode depressed, with psychotic features ([place]) [F31.5] 02/26/2018    Priority: High  . Bipolar affective disorder, current episode depressed with psychotic symptoms ([place]) [F31.2] 09/17/2017    Priority: High  . Bipolar I disorder, most recent episode (or current) manic ([place]) [F31.10] 09/02/2017    Priority: High  . Suicide attempt ([place]) [T14.91XA] 03/09/2018  . Acute respiratory failure with hypoxia ([place]) [J96.01] 03/04/2018  . UTI (urinary tract infection) [N39.0] 12/16/2017  . Palliative care encounter [Z51.5]   . Acute delirium [R41.0] 09/02/2017  . Altered mental status [R41.82]   . Acute encephalopathy [G93.40] 09/01/2017  . ESRD on dialysis ([place]) [N18.6, Z99.2] 04/06/2017  . Complication of vascular access for dialysis [T82.9XXA] 04/06/2017  . Hematuria [R31.9] 12/18/2016  . Polycystic kidney [Q61.3] 10/12/2016  . Ruptured cyst of kidney [Q61.00] 10/03/2016  . HTN (hypertension) [I10] 10/01/2016  . Chronic kidney disease [N18.9] 10/01/2016  .  Anemia associated with chronic renal failure [N18.9, D63.1] 09/24/2016  . Acute kidney insufficiency [N28.9] 07/28/2016  . Noninfectious diarrhea [K52.9]   . Benign neoplasm of cecum [D12.0]   . Benign neoplasm of ascending colon [D12.2]   . Diarrhea [R19.7]   . Nausea [R11.0]   . Gastric polyp [K31.7]   . Chronic constipation [K59.09] 12/27/2014  . Nausea with vomiting [R11.2] 12/27/2014  . Left sided abdominal pain [R10.9] 12/27/2014  . Personal history of colonic polyps [Z86.010] 05/10/2013   Total Time spent with patient: 20 minutes  Past Psychiatric History: bipolar disorder  Past Medical History:  Past Medical History:  Diagnosis Date  . Anal fissure   . Bipolar affective disorder ([place])   . CKD (chronic kidney disease)    Dr Holley Raring [name] 4  . Colon polyps   . Diverticulitis   . Diverticulitis   . Family history of adverse reaction to anesthesia    mom - PONV  . GERD (gastroesophageal reflux disease)   . Headache    migraines - none over 10 yrs  . Heart murmur   . History of hiatal hernia   . Hypertension   . Pancreatitis    Valproic acid  . Vertigo     Past Surgical History:  Procedure Laterality Date  . A/V FISTULAGRAM Left 04/14/2017   Procedure: A/V Fistulagram;  Surgeon: Katha Cabal, MD;  Location: [place] [facility];  Service: Cardiovascular;  Laterality: Left;  . A/V FISTULAGRAM Left 06/09/2017   Procedure: A/V FISTULAGRAM;  Surgeon: Katha Cabal, MD;  Location: [place] [facility];  Service: Cardiovascular;  Laterality: Left;  .  A/V FISTULAGRAM Left 11/02/2017   Procedure: A/V FISTULAGRAM;  Surgeon: Algernon Huxley, MD;  Location: [place] [facility];  Service: Cardiovascular;  Laterality: Left;  . A/V SHUNT INTERVENTION N/A 11/02/2017   Procedure: A/V SHUNT INTERVENTION;  Surgeon: Algernon Huxley, MD;  Location: [place] [facility];  Service: Cardiovascular;  Laterality: N/A;  . ABDOMINAL HYSTERECTOMY  1990 ?  . AV FISTULA PLACEMENT  4/30  .  BREAST EXCISIONAL BIOPSY Left 1994   neg surgical bx  . CHOLECYSTECTOMY  2003  . COLONOSCOPY  2014   Dr. Jamal Collin  . COLONOSCOPY WITH PROPOFOL N/A 09/24/2015   Procedure: COLONOSCOPY WITH random colon byopies.;  Surgeon: Lucilla Lame, MD;  Location: [place];  Service: Endoscopy;  Laterality: N/A;  . ESOPHAGOGASTRODUODENOSCOPY (EGD) WITH PROPOFOL N/A 09/24/2015   Procedure: ESOPHAGOGASTRODUODENOSCOPY (EGD) ;  Surgeon: Lucilla Lame, MD;  Location: [place];  Service: Endoscopy;  Laterality: N/A;  . EYE SURGERY    . PERIPHERAL VASCULAR CATHETERIZATION N/A 05/29/2015   Procedure: A/V Shuntogram/Fistulagram;  Surgeon: Katha Cabal, MD;  Location: [place] [facility];  Service: Cardiovascular;  Laterality: N/A;  . PERIPHERAL VASCULAR CATHETERIZATION N/A 05/29/2015   Procedure: A/V Shunt Intervention;  Surgeon: Katha Cabal, MD;  Location: [place] [facility];  Service: Cardiovascular;  Laterality: N/A;  . POLYPECTOMY  09/24/2015   Procedure: POLYPECTOMY INTESTINAL;  Surgeon: Lucilla Lame, MD;  Location: [place];  Service: Endoscopy;;  cecal polyp ascending polyp   Family History:  Family History  Problem Relation Age of Onset  . Stroke Father   . Hypertension Father   . Breast cancer Other   . Colon cancer Neg Hx   . Liver disease Neg Hx    Family Psychiatric  History: none Social History:  Social History   Substance and Sexual Activity  Alcohol Use No  . Alcohol/week: 0.0 standard drinks     Social History   Substance and Sexual Activity  Drug Use No    Social History   Socioeconomic History  . Marital status: Single    Spouse name: Not on file  . Number of children: 1  . Years of education: Not on file  . Highest education level: Not on file  Occupational History  . Occupation: Surveyor, quantity: [employer]  Social Needs  . Financial resource strain: Not on file  . Food insecurity:    Worry: Not on file    Inability: Not on file   . Transportation needs:    Medical: Not on file    Non-medical: Not on file  Tobacco Use  . Smoking status: Never Smoker  . Smokeless tobacco: Never Used  Substance and Sexual Activity  . Alcohol use: No    Alcohol/week: 0.0 standard drinks  . Drug use: No  . Sexual activity: Not Currently  Lifestyle  . Physical activity:    Days per week: Not on file    Minutes per session: Not on file  . Stress: Not on file  Relationships  . Social connections:    Talks on phone: Not on file    Gets together: Not on file    Attends religious service: Not on file    Active member of club or organization: Not on file    Attends meetings of clubs or organizations: Not on file    Relationship status: Not on file  Other Topics Concern  . 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   Additional Social History:                         Sleep: Fair  Appetite:  Fair  Current Medications: Current Facility-Administered Medications  Medication Dose Route Frequency Provider Last Rate Last Dose  . acetaminophen (TYLENOL) tablet 650 mg  650 mg Oral Q6H PRN Clapacs, [name], MD   650 mg at 03/17/18 2322  . alum & mag hydroxide-simeth (MAALOX/MYLANTA) 200-200-20 MG/5ML suspension 30 mL  30 mL Oral Q4H PRN Clapacs, [name], MD   30 mL at 03/14/18 1733  . brimonidine (ALPHAGAN) 0.2 % ophthalmic solution 1 drop  1 drop Both Eyes BID Clapacs, [name], MD   1 drop at 03/18/18 0746  . carbamazepine (TEGRETOL) tablet 400 mg  400 mg Oral TID Jakirah Zaun B, MD   400 mg at 03/18/18 1124  . dicyclomine (BENTYL) tablet 20 mg  20 mg Oral TID AC Clapacs, [name], MD   20 mg at 03/18/18 1124  . epoetin alfa (EPOGEN,PROCRIT) injection 10,000 Units  10,000 Units Intravenous Q M,W,F-HD Lavonia Dana, MD   10,000 Units at 03/17/18 1157  . famotidine (PEPCID) tablet 20 mg  20 mg Oral Daily Clapacs, [name], MD   20 mg at  03/18/18 0746  . feeding supplement (NEPRO CARB STEADY) liquid 237 mL  237 mL Oral BID BM Clapacs, [name], MD   237 mL at 03/18/18 1000  . magnesium hydroxide (MILK OF MAGNESIA) suspension 30 mL  30 mL Oral Daily PRN Clapacs, [name], MD      . metoprolol tartrate (LOPRESSOR) tablet 25 mg  25 mg Oral BID Clapacs, [name], MD   25 mg at 03/18/18 0746  . multivitamin (RENA-VIT) tablet 1 tablet  1 tablet Oral QHS Kanon Novosel B, MD   1 tablet at 03/17/18 2107  . pentafluoroprop-tetrafluoroeth (GEBAUERS) aerosol   Topical PRN Clapacs, [name], MD      . pentafluoroprop-tetrafluoroeth (GEBAUERS) aerosol   Topical PRN Kolluru, Lurena Nida, MD      . rOPINIRole (REQUIP) tablet 2 mg  2 mg Oral Daily Clapacs, [name], MD   2 mg at 03/18/18 0746  . timolol (TIMOPTIC) 0.5 % ophthalmic solution 1 drop  1 drop Both Eyes BID Clapacs, [name], MD   1 drop at 03/18/18 0746  . vitamin C (ASCORBIC ACID) tablet 250 mg  250 mg Oral BID Lamari Beckles B, MD   250 mg at 03/18/18 0746  . ziprasidone (GEODON) capsule 60 mg  60 mg Oral BID WC Melton Walls B, MD   60 mg at 03/18/18 0745    Lab Results:  Results for orders placed or performed during the hospital encounter of 03/11/18 (from the past 48 hour(s))  Carbamazepine level, total     Status: Abnormal   Collection Time: 03/17/18  7:09 PM  Result Value Ref Range   Carbamazepine Lvl 3.7 (L) 4.0 - 12.0 ug/mL    Comment: Performed at [facility], [address] [place], [place] [address]  CBC     Status: Abnormal   Collection Time: 03/18/18  6:42 AM  Result Value Ref Range   WBC 9.8 3.6 - 11.0 K/uL   RBC 2.53 (L) 3.80 - 5.20 MIL/uL   Hemoglobin 8.7 (L) 12.0 - 16.0 g/dL   HCT 25.0 (L) 35.0 - 47.0 %  MCV 99.0 80.0 - 100.0 fL   MCH 34.3 (H) 26.0 - 34.0 pg   MCHC 34.6 32.0 - 36.0 g/dL   RDW 14.2 11.5 - 14.5 %   Platelets 241 150 - 440 K/uL    Comment: Performed at [facility], [address] [place], [place] [address]  Renal  function panel     Status: Abnormal   Collection Time: 03/18/18  6:42 AM  Result Value Ref Range   Sodium 136 135 - 145 mmol/L   Potassium 3.8 3.5 - 5.1 mmol/L   Chloride 95 (L) 98 - 111 mmol/L   CO2 31 22 - 32 mmol/L   Glucose, Bld 101 (H) 70 - 99 mg/dL   BUN 28 (H) 8 - 23 mg/dL   Creatinine, Ser 3.58 (H) 0.44 - 1.00 mg/dL   Calcium 9.0 8.9 - 10.3 mg/dL   Phosphorus 3.8 2.5 - 4.6 mg/dL   Albumin 3.2 (L) 3.5 - 5.0 g/dL   GFR calc non Af Amer 12 (L) >60 mL/min   GFR calc Af Amer 14 (L) >60 mL/min    Comment: (NOTE) The eGFR has been calculated using the CKD EPI equation. This calculation has not been validated in all clinical situations. eGFR's persistently <60 mL/min signify possible Chronic Kidney Disease.    Anion gap 10 5 - 15    Comment: Performed at [facility], [address] [place], [place] [address]    Blood Alcohol level:  Lab Results  Component Value Date   [facility] <10 03/04/2018   ETH <10 25/00/3704    Metabolic Disorder Labs: Lab Results  Component Value Date   HGBA1C 4.8 09/18/2017   MPG 91.06 09/18/2017   No results found for: PROLACTIN Lab Results  Component Value Date   CHOL 251 (H) 09/18/2017   TRIG 124 09/18/2017   HDL 66 09/18/2017   CHOLHDL 3.8 09/18/2017   VLDL 25 09/18/2017   LDLCALC 160 (H) 09/18/2017   LDLCALC 43 05/14/2014    Physical Findings: AIMS:  , ,  ,  ,    CIWA:    COWS:     Musculoskeletal: Strength & Muscle Tone: within normal limits Gait & Station: normal Patient leans: N/A  Psychiatric Specialty Exam: Physical Exam  Nursing note and vitals reviewed. Psychiatric: Her speech is normal and behavior is normal. Thought content normal. Her mood appears anxious. Cognition and memory are impaired. She expresses impulsivity.    Review of Systems  Neurological: Negative.   Psychiatric/Behavioral: Positive for depression. The patient is nervous/anxious.   All other systems reviewed and are negative.   Blood  pressure 138/63, pulse 91, temperature 98.7 F (37.1 C), temperature source Oral, resp. rate 16, height '5\' 3"'$  (1.6 m), weight 66 kg, SpO2 98 %.Body mass index is 25.77 kg/m.  General Appearance: Casual  Eye Contact:  Good  Speech:  Clear and Coherent  Volume:  Normal  Mood:  Anxious  Affect:  Congruent  Thought Process:  Goal Directed and Descriptions of Associations: Intact  Orientation:  Full (Time, Place, and Person)  Thought Content:  WDL  Suicidal Thoughts:  No  Homicidal Thoughts:  No  Memory:  Immediate;   Poor Recent;   Poor Remote;   Poor  Judgement:  Impaired  Insight:  Shallow  Psychomotor Activity:  Normal  Concentration:  Concentration: Poor and Attention Span: Poor  Recall:  Poor  Fund of Knowledge:  Fair  Language:  Fair  Akathisia:  No  Handed:  Right  AIMS (if indicated):     Assets:  Communication Skills Desire for Improvement Financial Resources/Insurance Housing Resilience Social Support  ADL's:  Intact  Cognition:  WNL  Sleep:  Number of Hours: 7     Treatment Plan Summary: Daily contact with patient to assess and evaluate symptoms and progress in treatment and Medication management   Ms. Pesci is a 72 year old female with a history of bipolar disorder, recently discharged from psychiatry, who was readmitted immediately after discharge after Intentional overdose on medications when facing real or imaginary financial burden. She has been improving but is overwheled with discharge planning and has a hard time accepting the fact that she will not be able to return to independent living.    #Mood/psychosis, improving -increase Tegretol to 400 mg BID, level was low yesterday -continueGeodon to 60 mg BID  #HTN -Metoprolol 25 mg BID  #Dialysis -nephrology input is appreciated  #Deconditioning, improved -patient able to ambulate  #GERD -Pepcid 20 mg -Bentyl 20 mg TID  #Restless legs -Requip 2 mg nightly  #Glaucoma -continue eye  drops  #Disposition -family meeting -patient return to live with her son -follow upwith her regular psychiatrist  Orson Slick, MD 03/18/2018, 11:34 AM

## 2018-03-17 NOTE — Progress Notes (Signed)
PT Cancellation Note  Patient Details Name: Joyce [name] MRN: [id number] DOB: [birth year]   Cancelled Treatment:    Reason Eval/Treat Not Completed: Other (comment). Pt currently in HD, unavailable for therapy this date. Will re-attempt next available date.   Charise Leinbach 03/17/2018, 1:56 PM  Greggory Stallion, PT, DPT [phone]

## 2018-03-17 NOTE — Plan of Care (Signed)
Pt. Able to verbalize understanding of provided education, but does need reinforcement. Pt. Mood and emotional state reportedly improved and doing better today. Pt. More active around the milieu walking with 1:1 present for safety. Pt. Complaint with medications. Pt. Denies si/hi with this Probation officer and reports she can remain safe while on the unit.    Problem: Education: Goal: Knowledge of [facility] Education information/materials will improve Outcome: Progressing Goal: Emotional status will improve Outcome: Progressing Goal: Mental status will improve Outcome: Progressing   Problem: Activity: Goal: Interest or engagement in leisure activities will improve Outcome: Progressing   Problem: Health Behavior/Discharge Planning: Goal: Compliance with therapeutic regimen will improve Outcome: Progressing   Problem: Self-Concept: Goal: Ability to disclose and discuss suicidal ideas will improve Outcome: Progressing

## 2018-03-17 NOTE — BHH Counselor (Signed)
CSW spoke with [name] [phone]) the daughter-in-law of the patient to discuss discharge plans.  [name] reports that in September/October 2018 the patient had applied for medicaid and was told that the medicaid that she is eligible for only covers the [employer]. [name] reports that she will look for the paper with that information to find the exact dates. She does plan to reapply for medicaid in [place] since the patient will be going to live with them at discharge. CSW told [name] about the recommendations for home health PT and that a referral will be made for the patient. CSW informed [name] that we are planning to discharge the patient this week. [name] was okay with that and asked that we keep her updated on a sure date so that they can be sure to get the patients items from her house to theirs. She reports that they do not want to place the patient in [facility]. They want the patient to live with them and feel as though they can care for her at their home. She provided the information on the psychiatrist that the patient was seeing while living [place] to schedule a follow up appointment. [name] request to have a family meeting with the patient and the MD to discuss the discharge plan one last time to be sure everyone agrees with the plan. CSW will relay the information to the MD to see if that can be set up.   Darin Engels, MSW, Latanya Presser, [place] Clinical Social Worker 03/17/2018 2:21 PM

## 2018-03-18 LAB — CBC
HCT: 25 % — ABNORMAL LOW (ref 35.0–47.0)
Hemoglobin: 8.7 g/dL — ABNORMAL LOW (ref 12.0–16.0)
MCH: 34.3 pg — ABNORMAL HIGH (ref 26.0–34.0)
MCHC: 34.6 g/dL (ref 32.0–36.0)
MCV: 99 fL (ref 80.0–100.0)
Platelets: 241 10*3/uL (ref 150–440)
RBC: 2.53 MIL/uL — ABNORMAL LOW (ref 3.80–5.20)
RDW: 14.2 % (ref 11.5–14.5)
WBC: 9.8 10*3/uL (ref 3.6–11.0)

## 2018-03-18 LAB — RENAL FUNCTION PANEL
ANION GAP: 10 (ref 5–15)
Albumin: 3.2 g/dL — ABNORMAL LOW (ref 3.5–5.0)
BUN: 28 mg/dL — ABNORMAL HIGH (ref 8–23)
CO2: 31 mmol/L (ref 22–32)
Calcium: 9 mg/dL (ref 8.9–10.3)
Chloride: 95 mmol/L — ABNORMAL LOW (ref 98–111)
Creatinine, Ser: 3.58 mg/dL — ABNORMAL HIGH (ref 0.44–1.00)
GFR calc non Af Amer: 12 mL/min — ABNORMAL LOW (ref >60)
GFR, EST AFRICAN AMERICAN: 14 mL/min — AB (ref >60)
Glucose, Bld: 101 mg/dL — ABNORMAL HIGH (ref 70–99)
POTASSIUM: 3.8 mmol/L (ref 3.5–5.1)
Phosphorus: 3.8 mg/dL (ref 2.5–4.6)
SODIUM: 136 mmol/L (ref 135–145)

## 2018-03-18 MED ORDER — DICYCLOMINE HCL 20 MG PO TABS: 20.0000 mg | ORAL_TABLET | Freq: Three times a day (TID) | ORAL | 1 refills | Status: DC

## 2018-03-18 MED ORDER — ROPINIROLE HCL 2 MG PO TABS: 2.0000 mg | ORAL_TABLET | Freq: Every day | ORAL | 3 refills | Status: DC

## 2018-03-18 MED ORDER — CARBAMAZEPINE 200 MG PO TABS: 200.0000 mg | ORAL_TABLET | Freq: Three times a day (TID) | ORAL | 3 refills | Status: DC

## 2018-03-18 MED ORDER — CARBAMAZEPINE 200 MG PO TABS: 400.0000 mg | ORAL_TABLET | Freq: Two times a day (BID) | ORAL | Status: DC

## 2018-03-18 MED ORDER — ZIPRASIDONE HCL 60 MG PO CAPS: 60.0000 mg | ORAL_CAPSULE | Freq: Two times a day (BID) | ORAL | 1 refills | Status: DC

## 2018-03-18 MED ORDER — METOPROLOL TARTRATE 25 MG PO TABS: 25.0000 mg | ORAL_TABLET | Freq: Two times a day (BID) | ORAL | 1 refills | Status: DC

## 2018-03-18 MED ORDER — CARBAMAZEPINE 200 MG PO TABS
200.0000 mg | ORAL_TABLET | Freq: Three times a day (TID) | ORAL | Status: DC
  Administered 2018-03-19: 200 mg via ORAL
  Filled 2018-03-18: qty 1

## 2018-03-18 MED ORDER — FAMOTIDINE 20 MG PO TABS: 20.0000 mg | ORAL_TABLET | Freq: Every day | ORAL | 1 refills | Status: DC

## 2018-03-18 NOTE — Care Management (Signed)
RNCM received notification from [facility] that patient will need home health services at discharge. She will go to her son and daughter-in-law Zoa Dowty's address: [place] [address]  Phone [phone]. She will go to outpatient hemodialysis on Monday 03/22/18 (Labor Day) at 11:15AM per Estill Bamberg with [facility].  [facility] has accepted her for home health services. Rolling walker [facility]. MD updated. RNCM spoke with [name] and she agrees with this plan.

## 2018-03-18 NOTE — Progress Notes (Addendum)
Recreation Therapy Notes  Date: 03/18/2018  Time: 9:30 am  Location: Craft Room  Behavioral response: Appropriate   Intervention Topic: Goals  Discussion/Intervention:  Group content on today was focused on goals. Patients described what goals are and how they define goals. Individuals expressed how they go about setting goals and reaching them. The group identified how important goals are and if they make short term goals to reach long term goals. Patients described how many goals they work on at a time and what affects them not reaching their goal. Individuals described how much time they put into planning and obtaining their goals. The group participated in the intervention "My Goal Board" and made personal goal boards to help them achieve their goal. Clinical Observations/Feedback:  Patient attended group and was focused on what peers and staff had to say about the topic at hand. Individual participated in the intervention. Ronnett Pullin LRT/CTRS         Kellee Sittner 03/18/2018 12:30 PM

## 2018-03-18 NOTE — BHH Group Notes (Signed)
[place] Group Notes:  (Nursing/MHT/Case Management/Adjunct)  Date:  03/18/2018  Time:  12:47 AM  Type of Therapy:  Group Therapy  Participation Level:  Did Not Attend   Barnie Mort 03/18/2018, 12:47 AM

## 2018-03-18 NOTE — Progress Notes (Signed)
Physical Therapy Treatment Patient Details Name: Britainy [name] MRN: [id number] DOB: 07/09/1946 Today's Date: 03/18/2018    History of Present Illness 72 y.o. female recently admitted inpt with acute respiratory failure with hypoxia  that was possibly due to drug overdose. Pt was intubated on 8/15, became responsive on 8/18, and was extubated 8/19. Pt had been d/c from psychiatric care just before being addmitted to hospital.   Now is re-evaluated on behavioral med unit for PT and mobility.  PMH includes CKD, bipolar disorder, diverticulitis, hiatal hernia, HTN, and vertigo.     PT Comments    Patient was able to progress to standing exercises today and demonstrated progress in STS transfer ability.  She still presented with a flat affect and need for some redirection but appeared to follow directions more appropriately today as compared to previous treatments.  Pt did experience one lateral LOB during 100 ft of ambulation which PT assisted in correcting.  Pt requiries encouragement for use of RW as she does not feel she needs it.  PT explained the benefit of use of RW in fall prevention.   PT introduced standing strengthening and balance exercises and pt was able to complete with close CGA and unilateral use of UE for support.  Pt will continue to benefit from skilled PT with focus on strength, balance and fall prevention, safe use of AD and safe functional mobility.  Follow Up Recommendations  Home health PT;Supervision - Intermittent     Equipment Recommendations  Rolling walker with 5" wheels    Recommendations for Other Services       Precautions / Restrictions Precautions Precautions: Fall Precaution Comments: Orthostatic - on dialysis days per pt Restrictions Weight Bearing Restrictions: No    Mobility  Bed Mobility Overal bed mobility: Modified Independent Bed Mobility: Supine to Sit;Sit to Supine     Supine to sit: Independent Sit to supine: Independent       Transfers Overall transfer level: Needs assistance Equipment used: Rolling walker (2 wheeled);None Transfers: Sit to/from Stand Sit to Stand: Supervision         General transfer comment: Pt able to rise on her own.  PT provided education regarding proper use of RW.  Ambulation/Gait Ambulation/Gait assistance: Min guard;Min assist Gait Distance (Feet): 100 Feet Assistive device: Standard walker;None Gait Pattern/deviations: Step-through pattern;Decreased step length - right;Decreased step length - left;Narrow base of support Gait velocity: decreased  Gait velocity interpretation: 1.31 - 2.62 ft/sec, indicative of limited community ambulator General Gait Details: low to moderate foot clearance, one L lateral LOB which PT provided min A to self correct. PT discussed importance of use of RW for fall prevention as pt repeated twice that she feels she "walks better" without it.   Stairs             Wheelchair Mobility    Modified Rankin (Stroke Patients Only)       Balance Overall balance assessment: Needs assistance Sitting-balance support: Feet supported Sitting balance-Leahy Scale: Good     Standing balance support: No upper extremity supported Standing balance-Leahy Scale: Poor Standing balance comment: Pt is able to stand without use of RW but is better served to have support available in case of an LOB.  She required use of unilateral EU for support during higher level static stance activity.     Tandem Stance - Right Leg: 5 Tandem Stance - Left Leg: 5 Rhomberg - Eyes Opened: 10 Rhomberg - Eyes Closed: 10(Increased a-p sway)  Cognition Arousal/Alertness: Awake/alert Behavior During Therapy: Flat affect Overall Cognitive Status: Within Functional Limits for tasks assessed Area of Impairment: Safety/judgement                 Orientation Level: Situation;Person     Following Commands: Follows one step commands  consistently;Follows multi-step commands with increased time Safety/Judgement: Decreased awareness of safety;Decreased awareness of deficits Awareness: Intellectual Problem Solving: Requires verbal cues;Requires tactile cues;Slow processing General Comments: reprompted her for transfers and exercises      Exercises Other Exercises Other Exercises: Sit to stand with RW x10  with VC's for hand placement and body mechanics. Other Exercises: Standing heel raises x20 and toe raises x10 with RW for support, close CGA.  Pt was able to perform with unilateral UE for support.    General Comments        Pertinent Vitals/Pain      Home Living                      Prior Function            PT Goals (current goals can now be found in the care plan section) Acute Rehab PT Goals Patient Stated Goal: go home PT Goal Formulation: With patient Time For Goal Achievement: 03/26/18 Potential to Achieve Goals: Fair Progress towards PT goals: Progressing toward goals    Frequency    Min 2X/week      PT Plan Discharge plan needs to be updated    Co-evaluation              AM-PAC PT "6 Clicks" Daily Activity  Outcome Measure  Difficulty turning over in bed (including adjusting bedclothes, sheets and blankets)?: None Difficulty moving from lying on back to sitting on the side of the bed? : None Difficulty sitting down on and standing up from a chair with arms (e.g., wheelchair, bedside commode, etc,.)?: None Help needed moving to and from a bed to chair (including a wheelchair)?: None Help needed walking in hospital room?: A Little Help needed climbing 3-5 steps with a railing? : A Little 6 Click Score: 22    End of Session Equipment Utilized During Treatment: Gait belt Activity Tolerance: Patient tolerated treatment well Patient left: in bed;with nursing/sitter in room   PT Visit Diagnosis: Unsteadiness on feet (R26.81);History of falling (Z91.81);Muscle weakness  (generalized) (M62.81)     Time: 7517-0017 PT Time Calculation (min) (ACUTE ONLY): 23 min  Charges:  $Therapeutic Exercise: 8-22 mins $Therapeutic Activity: 8-22 mins                     Roxanne Gates, PT, DPT    Roxanne Gates 03/18/2018, 2:10 PM

## 2018-03-18 NOTE — BHH Suicide Risk Assessment (Signed)
[facility] Discharge Suicide Risk Assessment   Principal Problem: Bipolar I disorder, current or most recent episode depressed, with psychotic features [facility]) Discharge Diagnoses:  Patient Active Problem List   Diagnosis Date Noted  . Bipolar I disorder, current or most recent episode depressed, with psychotic features ([place]) [F31.5] 02/26/2018    Priority: High  . Bipolar affective disorder, current episode depressed with psychotic symptoms ([place]) [F31.2] 09/17/2017    Priority: High  . Bipolar I disorder, most recent episode (or current) manic ([place]) [F31.10] 09/02/2017    Priority: High  . Suicide attempt ([place]) [T14.91XA] 03/09/2018  . Acute respiratory failure with hypoxia ([place]) [J96.01] 03/04/2018  . UTI (urinary tract infection) [N39.0] 12/16/2017  . Palliative care encounter [Z51.5]   . Acute delirium [R41.0] 09/02/2017  . Altered mental status [R41.82]   . Acute encephalopathy [G93.40] 09/01/2017  . ESRD on dialysis ([place]) [N18.6, Z99.2] 04/06/2017  . Complication of vascular access for dialysis [T82.9XXA] 04/06/2017  . Hematuria [R31.9] 12/18/2016  . Polycystic kidney [Q61.3] 10/12/2016  . Ruptured cyst of kidney [Q61.00] 10/03/2016  . HTN (hypertension) [I10] 10/01/2016  . Chronic kidney disease [N18.9] 10/01/2016  . Anemia associated with chronic renal failure [N18.9, D63.1] 09/24/2016  . Acute kidney insufficiency [N28.9] 07/28/2016  . Noninfectious diarrhea [K52.9]   . Benign neoplasm of cecum [D12.0]   . Benign neoplasm of ascending colon [D12.2]   . Diarrhea [R19.7]   . Nausea [R11.0]   . Gastric polyp [K31.7]   . Chronic constipation [K59.09] 12/27/2014  . Nausea with vomiting [R11.2] 12/27/2014  . Left sided abdominal pain [R10.9] 12/27/2014  . Personal history of colonic polyps [Z86.010] 05/10/2013    Total Time spent with patient: 20 minutes  Musculoskeletal: Strength & Muscle Tone: within normal limits Gait & Station: normal Patient leans: N/A  Psychiatric  Specialty Exam: Review of Systems  Neurological: Negative.   Psychiatric/Behavioral: Negative.   All other systems reviewed and are negative.   Blood pressure (!) 121/47, pulse 76, temperature 98.8 F (37.1 C), temperature source Oral, resp. rate 14, height 5\' 3"  (1.6 m), weight 68 kg, SpO2 100 %.Body mass index is 26.56 kg/m.  General Appearance: Casual  Eye Contact::  Good  Speech:  Clear and Coherent409  Volume:  Normal  Mood:  Euthymic  Affect:  Appropriate  Thought Process:  Goal Directed and Descriptions of Associations: Intact  Orientation:  Full (Time, Place, and Person)  Thought Content:  WDL  Suicidal Thoughts:  No  Homicidal Thoughts:  No  Memory:  Immediate;   Poor Recent;   Poor Remote;   Poor  Judgement:  Impaired  Insight:  Shallow  Psychomotor Activity:  Normal  Concentration:  Poor  Recall:  Poor  Fund of Knowledge:Poor  Language: Fair  Akathisia:  No  Handed:  Right  AIMS (if indicated):     Assets:  Communication Skills Desire for Improvement Financial Resources/Insurance Housing Resilience Social Support  Sleep:  Number of Hours: 8.25  Cognition: WNL  ADL's:  Intact   Mental Status Per Nursing Assessment::   On Admission:  NA  Demographic Factors:  Age 72 or older, Divorced or widowed and Caucasian  Loss Factors: Decline in physical health and Financial problems/change in socioeconomic status  Historical Factors: Prior suicide attempts, Family history of mental illness or substance abuse and Impulsivity  Risk Reduction Factors:   Sense of responsibility to family, Living with another person, especially a relative and Positive social support  Continued Clinical Symptoms:  Bipolar Disorder:  Depressive phase Depression:   Impulsivity  Cognitive Features That Contribute To Risk:  None    Suicide Risk:  Minimal: No identifiable suicidal ideation.  Patients presenting with no risk factors but with morbid ruminations; may be classified  as minimal risk based on the severity of the depressive symptoms  Follow-up Information    [facility]. Go on 03/22/2018.   Why:  Please follow up for Dialysis treatment on Mondays, Wednesday and Fridays at 11:15am. Thank you. Contact information: Address: [address], [place], [place] [address] Phone: [phone]       [facility]. Go on 03/25/2018.   Why:  Please follow up with Dr. Binnie Rail on Thursday March 25, 2018 at 11am. Thank you. Contact information: Address: [address] [place], [place] [address] Phone: [phone] Fax: [phone]          Plan Of Care/Follow-up recommendations:  Activity:  as tolerated Diet:  renal diet Other:  keep follow up appointments  Orson Slick, MD 03/19/2018, 11:25 AM

## 2018-03-18 NOTE — BHH Group Notes (Signed)
03/18/2018 1PM  Type of Therapy/Topic:  Group Therapy:  Balance in Life  Participation Level:  Active  Description of Group:   This group will address the concept of balance and how it feels and looks when one is unbalanced. Patients will be encouraged to process areas in their lives that are out of balance and identify reasons for remaining unbalanced. Facilitators will guide patients in utilizing problem-solving interventions to address and correct the stressor making their life unbalanced. Understanding and applying boundaries will be explored and addressed for obtaining and maintaining a balanced life. Patients will be encouraged to explore ways to assertively make their unbalanced needs known to significant others in their lives, using other group members and facilitator for support and feedback.  Therapeutic Goals: 1. Patient will identify two or more emotions or situations they have that consume much of in their lives. 2. Patient will identify signs/triggers that life has become out of balance:  3. Patient will identify two ways to set boundaries in order to achieve balance in their lives:  4. Patient will demonstrate ability to communicate their needs through discussion and/or role plays  Summary of Patient Progress: Patient attended free expressions group which included recreational play and music therapy. Patient interacted appropriately with other group members. Patient is still in the process of meeting their treatment goals.        Therapeutic Modalities:   Cognitive Behavioral Therapy Solution-Focused Therapy Assertiveness Training  Darin Engels, [place]

## 2018-03-18 NOTE — Plan of Care (Signed)
Patient is cooperative and appropriate during assessment. Patient denies SI/HI/AVH. Patient is compliant with medication and treatment. Patient's safety is maintained on unit. Patient is provided with walker to maintain safety. Patient has history of falls on this unit. Patient no complaints at this time. Patient's goal is to be discharged. Patient agrees to maintain safety and use provided walker.    Problem: Education: Goal: Knowledge of [place] General Education information/materials will improve Outcome: Progressing Goal: Emotional status will improve Outcome: Progressing Goal: Mental status will improve Outcome: Progressing   Problem: Self-Concept: Goal: Ability to disclose and discuss suicidal ideas will improve Outcome: Progressing

## 2018-03-18 NOTE — Plan of Care (Addendum)
Patient found awake in bed upon my arrival. Patient is isolative to bed throughout the evening. Neither visible nor social. Patient denies all complaints including SI/HI/AVH. Denies pain. Reports eating and voiding adequately although did not eat snack. Patient is polite and cooperative. Compliant with HS medications. Reminded patient regarding use of walker for ambulation. Verbalized understanding. Q 15 minute checks maintained. Will continue to monitor throughout the shift. Patient slept 8.25 hours. No apparent distress. Compliant with am VS. VSS. Requests and is provided hygiene products for shower. Will endorse care to oncoming shift.  Problem: Education: Goal: Knowledge of [place] General Education information/materials will improve Outcome: Progressing Goal: Emotional status will improve Outcome: Progressing Goal: Mental status will improve Outcome: Progressing Goal: Verbalization of understanding the information provided will improve Outcome: Progressing   Problem: Coping: Goal: Coping ability will improve Outcome: Progressing   Problem: Health Behavior/Discharge Planning: Goal: Compliance with therapeutic regimen will improve Outcome: Progressing

## 2018-03-18 NOTE — Discharge Summary (Signed)
Physician Discharge Summary Note  Patient:  Joyce [name] is an 72 y.o., female MRN:  [id number] DOB:  [birth year] Patient phone:  [phone] (home)  Patient address:   [place] [address],  Total Time spent with patient: 20 minutes plus 15 min on care coordination and documantation.  Date of Admission:  03/11/2018 Date of Discharge: 03/19/2018  Reason for Admission:  Overdose.  History of Present Illness:   Identifying data. Joyce [name] is a 72 year old female with a history of bipolar disorder.  Chief complaint. "I messed up, didn't I."  History of present illness. Information was obtained from the patient and the chart. The patient was hospitalized at [facility] psychiatry 8/ 03/2018 until 03/03/2018 for a hypomanic episode. At her request, she did not return to live with her son but was discharged to her own place. On the same night, she intentionally overdosed on multiple medications overwhelmed with responsibilities and bills. Her sister found her unresponsive in the morning. She was admitted to ICU. Following extubation, the patient was transferred back to psychiatry for further stabilization.  Joyce [name] has been asleep all morning. She ate very little lunch. She overwhelmed with guilt, feels that she messed up everything and that her only option is to refuse dialysis. She worries about her medical bills and tells me that she sometimes has to pay $800 per treatment???? Her income is only $1093/month and somehow she does not qualify for Medicaid. She also figured out that she would not be allowed to return home with Joyce [name] and Joyce [name]. She is very tearful about it. She felt that we will "kick her out" of the hospital today. She has been very tearful. Her decision to stop dialysis was a "practical" solution to real problems. When reassured that the children are happy to take her back, we will try to help with Medicaid and will not throw her out of the hospital, she relaxed  some and wants to continue with dialysis. She seems to be coherent and focused on our conversation so far.  She then complained about "all over pain from a fall". There are multiple bruises all over her body. She started telling me that she feel getting ready for work, that she did not have the time to call her employer and that she was surprised to wake up in the hospital. At this point, she did not remember anything about ICU or her children.  Spoke with Joyce [name], daughter in law, to inform about patient progress and discuss SNF transfer. The family is in agreement with the plan if rehabilitation necessary.   The patient has not been able to ambulate since the overdose and SNF rehab is recommended. She is on soft diet for dysphagia.  Past psychiatric history. Long history of bipolar with multiple hospitalizations, medication trials and overdoses over the years. She received life-saving ECT in May 2019. She no longer is a patient of Dr. Thurmond Butts and was recently referred to [facility].   Family psychiatric history. None reported.  Social history. She used to live independently and work at [employer] until started dialysis in December 2018. Following discharge from psychiatry in May 2019, she stayed with her son until most recent admission.   Principal Problem: Bipolar I disorder, current or most recent episode depressed, with psychotic features [name]) Discharge Diagnoses: Patient Active Problem List   Diagnosis Date Noted  . Bipolar I disorder, current or most recent episode depressed, with psychotic features ([place]) [F31.5] 02/26/2018    Priority: High  .  Bipolar affective disorder, current episode depressed with psychotic symptoms ([place]) [F31.2] 09/17/2017    Priority: High  . Bipolar I disorder, most recent episode (or current) manic ([place]) [F31.10] 09/02/2017    Priority: High  . Suicide attempt ([place]) [T14.91XA] 03/09/2018  . Acute respiratory failure with hypoxia ([place]) [J96.01] 03/04/2018  . UTI  (urinary tract infection) [N39.0] 12/16/2017  . Palliative care encounter [Z51.5]   . Acute delirium [R41.0] 09/02/2017  . Altered mental status [R41.82]   . Acute encephalopathy [G93.40] 09/01/2017  . ESRD on dialysis ([place]) [N18.6, Z99.2] 04/06/2017  . Complication of vascular access for dialysis [T82.9XXA] 04/06/2017  . Hematuria [R31.9] 12/18/2016  . Polycystic kidney [Q61.3] 10/12/2016  . Ruptured cyst of kidney [Q61.00] 10/03/2016  . HTN (hypertension) [I10] 10/01/2016  . Chronic kidney disease [N18.9] 10/01/2016  . Anemia associated with chronic renal failure [N18.9, D63.1] 09/24/2016  . Acute kidney insufficiency [N28.9] 07/28/2016  . Noninfectious diarrhea [K52.9]   . Benign neoplasm of cecum [D12.0]   . Benign neoplasm of ascending colon [D12.2]   . Diarrhea [R19.7]   . Nausea [R11.0]   . Gastric polyp [K31.7]   . Chronic constipation [K59.09] 12/27/2014  . Nausea with vomiting [R11.2] 12/27/2014  . Left sided abdominal pain [R10.9] 12/27/2014  . Personal history of colonic polyps [Z86.010] 05/10/2013   Past Medical History:  Past Medical History:  Diagnosis Date  . Anal fissure   . Bipolar affective disorder ([place])   . CKD (chronic kidney disease)    Dr Holley Raring [name] 4  . Colon polyps   . Diverticulitis   . Diverticulitis   . Family history of adverse reaction to anesthesia    mom - PONV  . GERD (gastroesophageal reflux disease)   . Headache    migraines - none over 10 yrs  . Heart murmur   . History of hiatal hernia   . Hypertension   . Pancreatitis    Valproic acid  . Vertigo     Past Surgical History:  Procedure Laterality Date  . A/V FISTULAGRAM Left 04/14/2017   Procedure: A/V Fistulagram;  Surgeon: Katha Cabal, MD;  Location: [place] [facility];  Service: Cardiovascular;  Laterality: Left;  . A/V FISTULAGRAM Left 06/09/2017   Procedure: A/V FISTULAGRAM;  Surgeon: Katha Cabal, MD;  Location: [place] [facility];  Service:  Cardiovascular;  Laterality: Left;  . A/V FISTULAGRAM Left 11/02/2017   Procedure: A/V FISTULAGRAM;  Surgeon: Algernon Huxley, MD;  Location: [place] [facility];  Service: Cardiovascular;  Laterality: Left;  . A/V SHUNT INTERVENTION N/A 11/02/2017   Procedure: A/V SHUNT INTERVENTION;  Surgeon: Algernon Huxley, MD;  Location: [place] [facility];  Service: Cardiovascular;  Laterality: N/A;  . ABDOMINAL HYSTERECTOMY  1990 ?  . AV FISTULA PLACEMENT  4/30  . BREAST EXCISIONAL BIOPSY Left 1994   neg surgical bx  . CHOLECYSTECTOMY  2003  . COLONOSCOPY  2014   Dr. Jamal Collin  . COLONOSCOPY WITH PROPOFOL N/A 09/24/2015   Procedure: COLONOSCOPY WITH random colon byopies.;  Surgeon: Lucilla Lame, MD;  Location: [place];  Service: Endoscopy;  Laterality: N/A;  . ESOPHAGOGASTRODUODENOSCOPY (EGD) WITH PROPOFOL N/A 09/24/2015   Procedure: ESOPHAGOGASTRODUODENOSCOPY (EGD) ;  Surgeon: Lucilla Lame, MD;  Location: [place];  Service: Endoscopy;  Laterality: N/A;  . EYE SURGERY    . PERIPHERAL VASCULAR CATHETERIZATION N/A 05/29/2015   Procedure: A/V Shuntogram/Fistulagram;  Surgeon: Katha Cabal, MD;  Location: [place] [facility];  Service:  Cardiovascular;  Laterality: N/A;  . PERIPHERAL VASCULAR CATHETERIZATION N/A 05/29/2015   Procedure: A/V Shunt Intervention;  Surgeon: Katha Cabal, MD;  Location: [place] [facility];  Service: Cardiovascular;  Laterality: N/A;  . POLYPECTOMY  09/24/2015   Procedure: POLYPECTOMY INTESTINAL;  Surgeon: Lucilla Lame, MD;  Location: [place];  Service: Endoscopy;;  cecal polyp ascending polyp   Family History:  Family History  Problem Relation Age of Onset  . Stroke Father   . Hypertension Father   . Breast cancer Other   . Colon cancer Neg Hx   . Liver disease Neg Hx     Social History:  Social History   Substance and Sexual Activity  Alcohol Use No  . Alcohol/week: 0.0 standard drinks     Social History   Substance and Sexual  Activity  Drug Use No    Social History   Socioeconomic History  . Marital status: Single    Spouse name: Not on file  . Number of children: 1  . Years of education: Not on file  . Highest education level: Not on file  Occupational History  . Occupation: Surveyor, quantity: [employer]  Social Needs  . Financial resource strain: Not on file  . Food insecurity:    Worry: Not on file    Inability: Not on file  . Transportation needs:    Medical: Not on file    Non-medical: Not on file  Tobacco Use  . Smoking status: Never Smoker  . Smokeless tobacco: Never Used  Substance and Sexual Activity  . Alcohol use: No    Alcohol/week: 0.0 standard drinks  . Drug use: No  . Sexual activity: Not Currently  Lifestyle  . Physical activity:    Days per week: Not on file    Minutes per session: Not on file  . Stress: Not on file  Relationships  . Social connections:    Talks on phone: Not on file    Gets together: Not on file    Attends religious service: Not on file    Active member of club or organization: Not on file    Attends meetings of clubs or organizations: Not on file    Relationship status: Not on file  Other Topics Concern  . 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    Hospital Course:    Ms. Gritz is a 72 year old female with a history of bipolar disorder, recently discharged from psychiatry, who was readmitted immediately after discharge after intentional overdose on medications when facing real or imaginary financial burden. The patient has improved. She accepted medications and dialysis and tolerated them well. At the time of discharge, she adamantly denies any thoughts, intention or plans to hurt herself or others. She is forward thinking and optimistic about the future. During family meeting she indicated that she understands new living arrangements and agrees to to work with  her children, Joyce [name] and Joyce [name].     #Mood/psychosis, improved -continue Tegretol 200 mg TID -continueGeodon to 60 mg BID  #HTN -Metoprolol 25 mg BID  #Dialysis -nephrology input is appreciated  #Deconditioning, improved -patient able to ambulate  #GERD -Pepcid 20 mg -Bentyl 20 mg TID  #Restless legs -Requip 2 mg nightly  #Glaucoma -continue eye drops  #Disposition -I was unable to attend family meeting as planned due to late dialysis -spoke with Joyce [name], daughter-in-law on the  phone, she voiced understanding -patient returns to live with her son -follow upwith her regular psychiatrist, nephrologis and dialysis -discharged with home health  Physical Findings: AIMS:  , ,  ,  ,    CIWA:    COWS:     Musculoskeletal: Strength & Muscle Tone: within normal limits Gait & Station: normal Patient leans: N/A  Psychiatric Specialty Exam: Physical Exam  Nursing note and vitals reviewed. Psychiatric: She has a normal mood and affect. Her speech is normal and behavior is normal. Thought content normal. Cognition and memory are normal. She expresses impulsivity.    Review of Systems  Neurological: Negative.   Psychiatric/Behavioral: Negative.   All other systems reviewed and are negative.   Blood pressure (!) 127/50, pulse 69, temperature 98.7 F (37.1 C), temperature source Oral, resp. rate 16, height 5\' 3"  (1.6 m), weight 66 kg, SpO2 98 %.Body mass index is 25.77 kg/m.  General Appearance: Casual  Eye Contact:  Good  Speech:  Clear and Coherent  Volume:  Normal  Mood:  Euthymic  Affect:  Appropriate  Thought Process:  Goal Directed and Descriptions of Associations: Intact  Orientation:  Full (Time, Place, and Person)  Thought Content:  WDL  Suicidal Thoughts:  No  Homicidal Thoughts:  No  Memory:  Immediate;   Poor Recent;   Poor Remote;   Poor  Judgement:  Impaired  Insight:  Shallow  Psychomotor Activity:  Normal  Concentration:  Concentration:  Poor and Attention Span: Poor  Recall:  Poor  Fund of Knowledge:  Fair  Language:  Fair  Akathisia:  No  Handed:  Right  AIMS (if indicated):     Assets:  Communication Skills Desire for Improvement Financial Resources/Insurance Housing Resilience Social Support  ADL's:  Intact  Cognition:  WNL  Sleep:  Number of Hours: 7     Have you used any form of tobacco in the last 30 days? (Cigarettes, Smokeless Tobacco, Cigars, and/or Pipes): No  Has this patient used any form of tobacco in the last 30 days? (Cigarettes, Smokeless Tobacco, Cigars, and/or Pipes) Yes, No  Blood Alcohol level:  Lab Results  Component Value Date   ETH <10 03/04/2018   ETH <10 78/93/8101    Metabolic Disorder Labs:  Lab Results  Component Value Date   HGBA1C 4.8 09/18/2017   MPG 91.06 09/18/2017   No results found for: PROLACTIN Lab Results  Component Value Date   CHOL 251 (H) 09/18/2017   TRIG 124 09/18/2017   HDL 66 09/18/2017   CHOLHDL 3.8 09/18/2017   VLDL 25 09/18/2017   LDLCALC 160 (H) 09/18/2017   LDLCALC 43 05/14/2014    See Psychiatric Specialty Exam and Suicide Risk Assessment completed by Attending Physician prior to discharge.  Discharge destination:  Home  Is patient on multiple antipsychotic therapies at discharge:  No   Has Patient had three or more failed trials of antipsychotic monotherapy by history:  No  Recommended Plan for Multiple Antipsychotic Therapies: NA   Allergies as of 03/18/2018      Reactions   Morphine And Related Shortness Of Breath   Pt reports chest pain and difficulty breathing.   Indomethacin Hives   Pollen Extract Other (See Comments)   Sinus problems and HA      Medication List    STOP taking these medications   haloperidol 5 MG tablet Commonly known as:  HALDOL   lidocaine 5 % Commonly known as:  LIDODERM   temazepam 15 MG capsule Commonly  known as:  RESTORIL     TAKE these medications     Indication  acetaminophen 325 MG  tablet Commonly known as:  TYLENOL Take 325 mg 2 (two) times daily as needed by mouth for moderate pain or headache.  Indication:  Pain   brimonidine 0.2 % ophthalmic solution Commonly known as:  ALPHAGAN Place 1 drop into both eyes 2 (two) times daily.  Indication:  Wide-Angle Glaucoma   carbamazepine 200 MG tablet Commonly known as:  TEGRETOL Take 1 tablet (200 mg total) by mouth 3 (three) times daily.  Indication:  Manic-Depression   cholecalciferol 1000 units tablet Commonly known as:  VITAMIN D Take 1,000 Units by mouth daily.  Indication:  general health   cyanocobalamin 1000 MCG/ML injection Commonly known as:  (VITAMIN B-12) Inject 1,000 mcg into the muscle every 30 (thirty) days.  Indication:  Inadequate Vitamin B12   dicyclomine 20 MG tablet Commonly known as:  BENTYL Take 1 tablet (20 mg total) by mouth 3 (three) times daily before meals.  Indication:  Irritable Bowel Syndrome   famotidine 20 MG tablet Commonly known as:  PEPCID Take 1 tablet (20 mg total) by mouth daily.  Indication:  Gastroesophageal Reflux Disease   lidocaine-prilocaine cream Commonly known as:  EMLA Apply 1 application every Monday, Wednesday, and Friday topically. At dialysis  Indication:  Anesthesia to a Specific Part of the Body   metoprolol tartrate 25 MG tablet Commonly known as:  LOPRESSOR Take 1 tablet (25 mg total) by mouth 2 (two) times daily.  Indication:  High Blood Pressure Disorder   multivitamin Tabs tablet TAKE 1 TABLET BY MOUTH EVERYDAY AT BEDTIME  Indication:  dialysis   rOPINIRole 2 MG tablet Commonly known as:  REQUIP Take 1 tablet (2 mg total) by mouth daily.  Indication:  Restless Leg Syndrome   timolol 0.5 % ophthalmic solution Commonly known as:  TIMOPTIC Place 1 drop into both eyes 2 (two) times daily.  Indication:  Wide-Angle Glaucoma   ziprasidone 60 MG capsule Commonly known as:  GEODON Take 1 capsule (60 mg total) by mouth 2 (two) times daily with  a meal.  Indication:  Manic-Depression      Follow-up Information    [facility]. Go on 03/22/2018.   Why:  Please follow up for Dialysis treatment on Mondays, Wednesday and Fridays at 11:15am. Thank you. Contact information: Address: [address], [place], [place] [address] Phone: [phone]       [facility]. Go on 03/25/2018.   Why:  Please follow up with Dr. Binnie Rail on Thursday March 25, 2018 at 11am. Thank you. Contact information: Address: [address] [place], [place] [address] Phone: [phone] Fax: [phone]          Follow-up recommendations:  Activity:  as tolerated Diet:  renal Other:  keep follow up appointments  Comments:    Signed: Orson Slick, MD 03/18/2018, 6:53 PM

## 2018-03-19 IMAGING — MR MR HEAD W/O CM
12 series · 48 of 48 positions shown · non-contrast
Comparison: CT head 09/01/2017, MRI 12/18/2013

CLINICAL DATA: Altered level of consciousness. End-stage renal
disease

EXAM:
MRI HEAD WITHOUT CONTRAST
TECHNIQUE: Multiplanar, multiecho pulse sequences of the brain and surrounding
structures were obtained without intravenous contrast.

[Series 2: T1 · sagittal · 5.0mm · 0.45mm/px · 2 of 25 slices shown (1 of 2)]
[im 1/25]
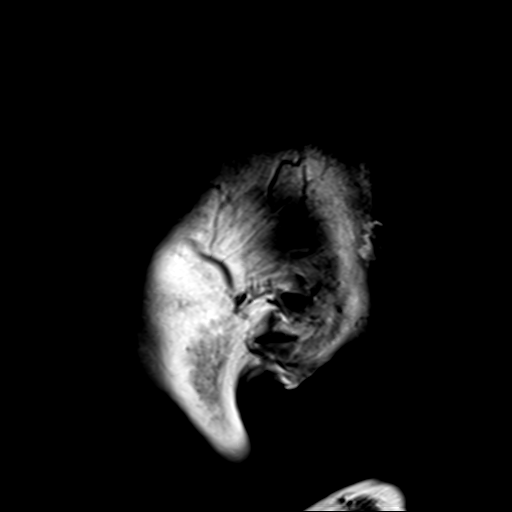
[im 25/25]
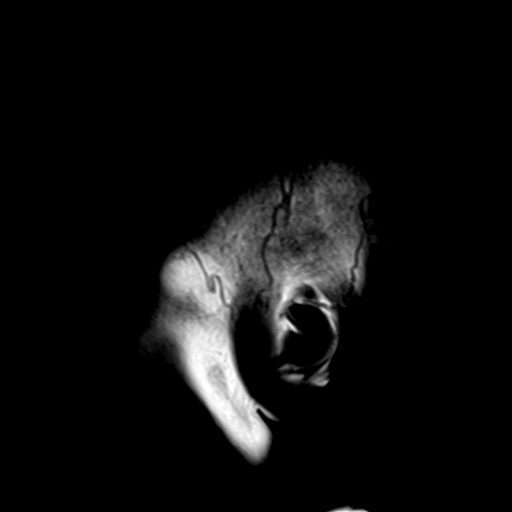

[Series 4: DWI · axial · 3.0mm · 1.80mm/px · z∈[-50,+109]mm · 4 of 55 slices shown (1 of 2)]
[im 1/55]
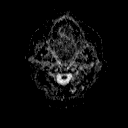
[im 19/55]
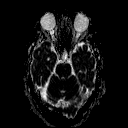
[im 37/55]
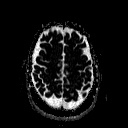
[im 55/55]
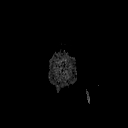

[Series 6: DWI · coronal · 3.0mm · 1.80mm/px · 4 of 45 slices shown (2 of 2)]
[im 1/45]
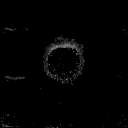
[im 15/45]
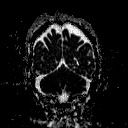
[im 30/45]
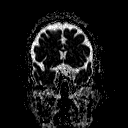
[im 45/45]
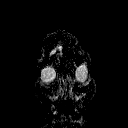

[Series 7: T2 · axial · 5.0mm · 0.60mm/px · z∈[-48,+106]mm · 2 of 25 slices shown (1 of 3)]
[im 1/25]
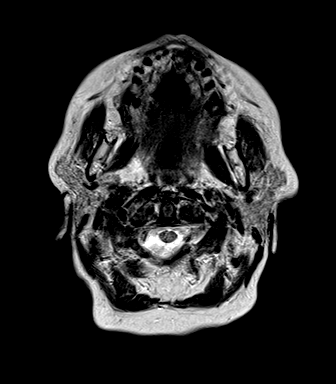
[im 25/25]
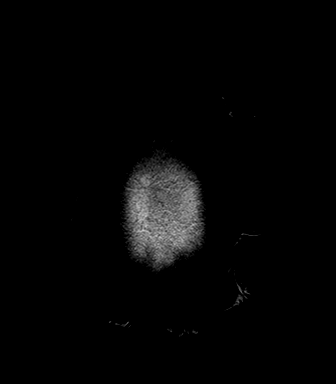

[Series 8: FLAIR · axial · 3.0mm · 0.45mm/px · z∈[-48,+106]mm · 4 of 53 slices shown (1 of 2)]
[im 1/53]
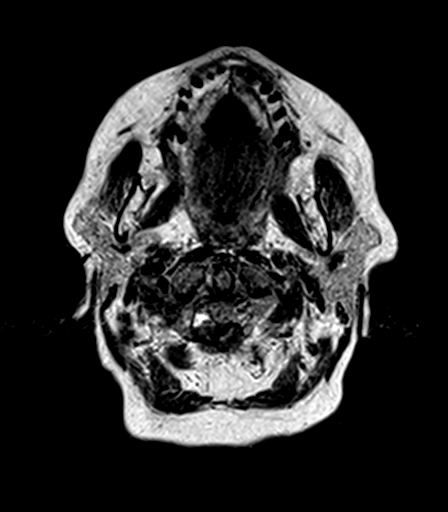
[im 18/53]
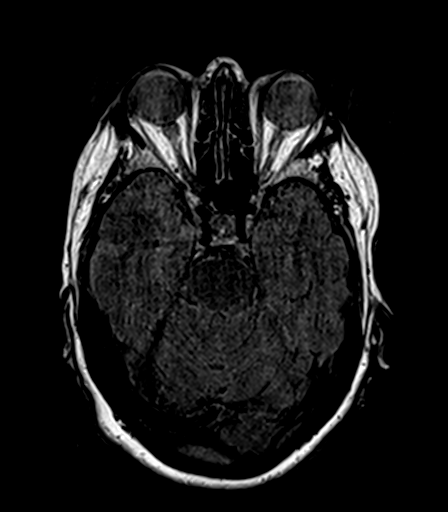
[im 35/53]
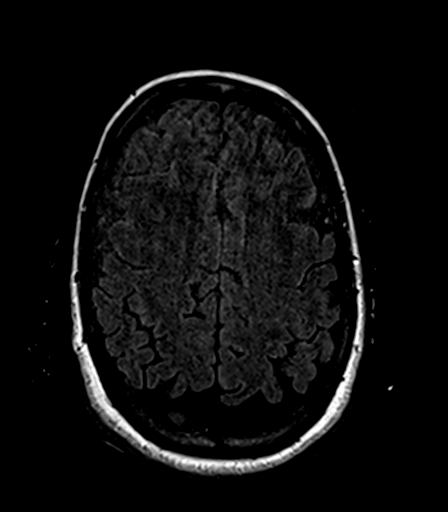
[im 53/53]
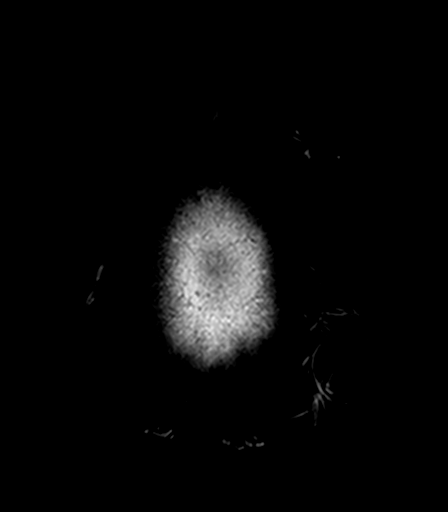

[Series 9: T2 · axial · 5.0mm · 0.45mm/px · z∈[-48,+106]mm · 2 of 25 slices shown (2 of 3)]
[im 1/25]
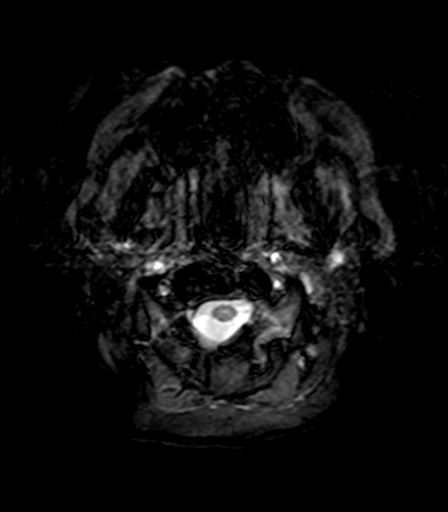
[im 25/25]
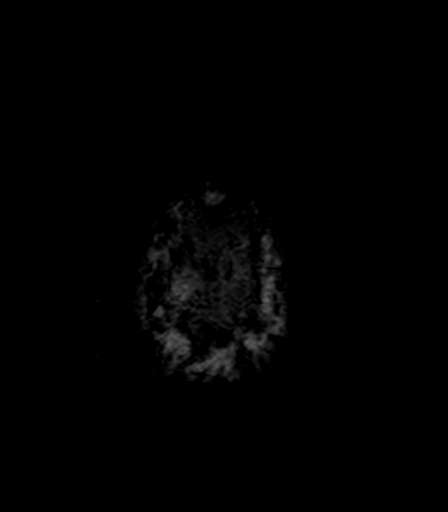

[Series 10: T1 · axial · 1.0mm · 1.00mm/px · z∈[-54,+118]mm · 14 of 176 slices shown (2 of 2)]
[im 1/176]
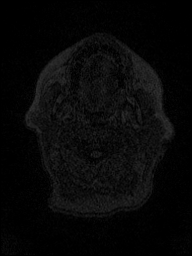
[im 14/176]
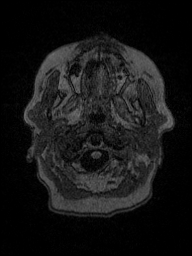
[im 27/176]
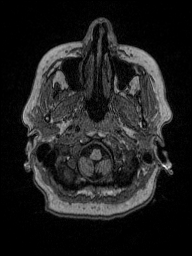
[im 41/176]
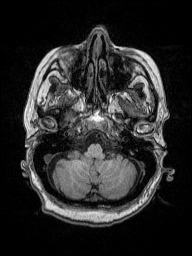
[im 54/176]
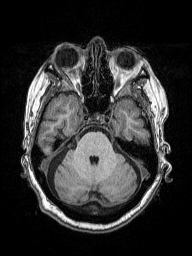
[im 68/176]
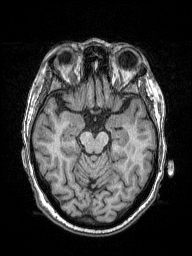
[im 81/176]
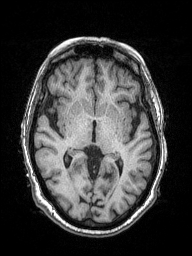
[im 95/176]
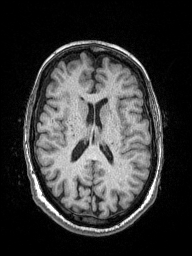
[im 108/176]
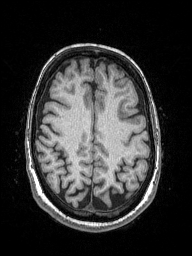
[im 122/176]
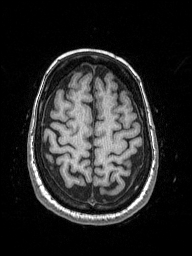
[im 135/176]
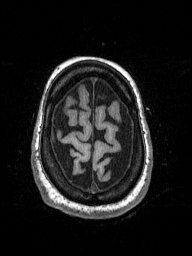
[im 149/176]
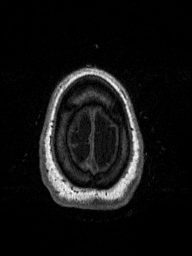
[im 162/176]
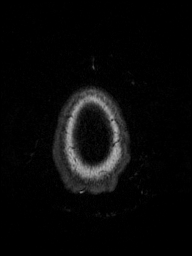
[im 176/176]
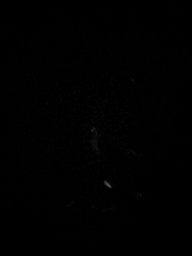

[Series 11: T2 · coronal · 5.0mm · 0.49mm/px · 2 of 29 slices shown (3 of 3)]
[im 1/29]
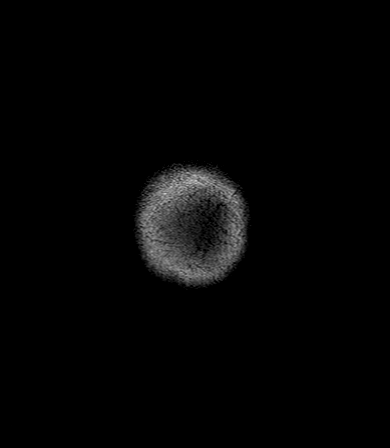
[im 29/29]
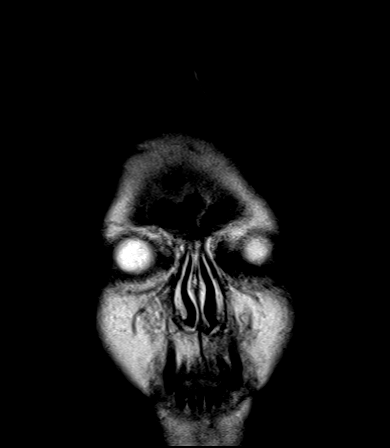

[Series 12: FLAIR · axial · 3.0mm · 0.45mm/px · z∈[-48,+106]mm · 4 of 53 slices shown (2 of 2)]
[im 1/53]
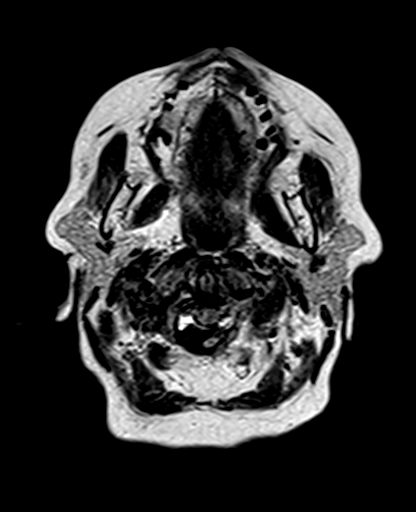
[im 18/53]
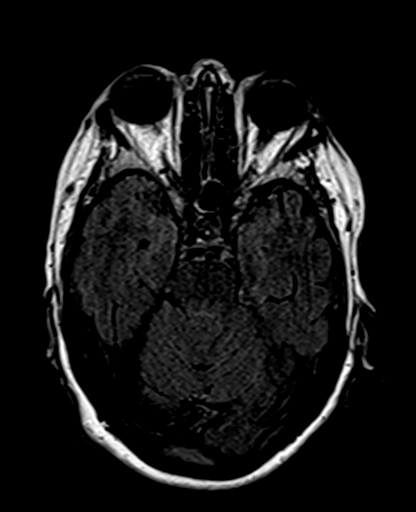
[im 35/53]
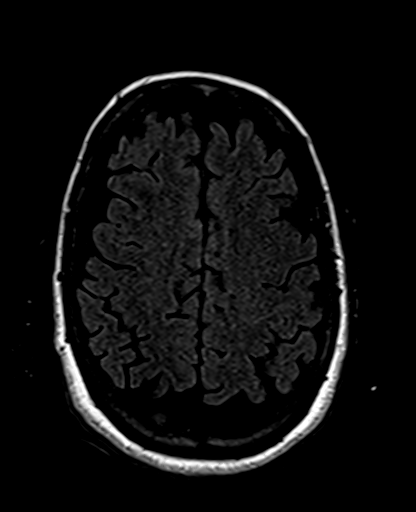
[im 53/53]
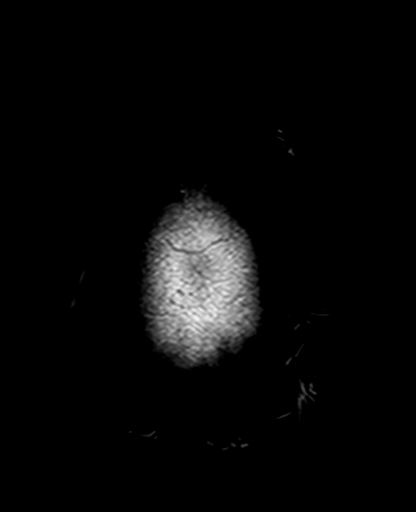

[Series 13: GRE · axial · 5.0mm · 0.45mm/px · z∈[-48,+106]mm · 2 of 25 slices shown]
[im 1/25]
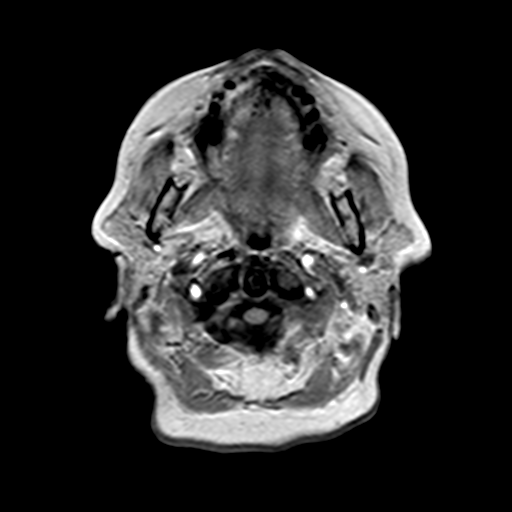
[im 25/25]
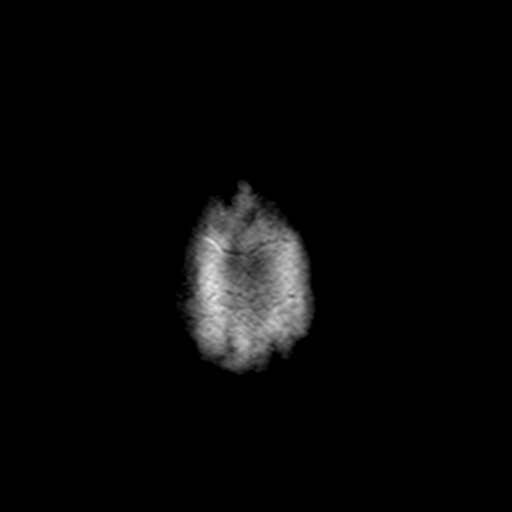

[Series 100: ax (id) · axial · 3.0mm · 1.80mm/px · z∈[-50,+109]mm · 4 of 55 slices shown]
[im 1/55]
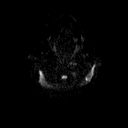
[im 19/55]
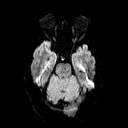
[im 37/55]
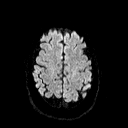
[im 55/55]
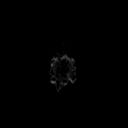

[Series 101: cor (id) · coronal · 3.0mm · 1.80mm/px · 4 of 45 slices shown]
[im 1/45]
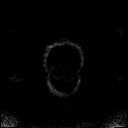
[im 15/45]
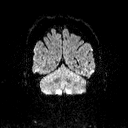
[im 30/45]
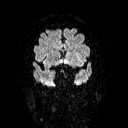
[im 45/45]
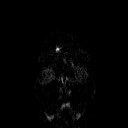

[48 of 48 positions shown; findings below may reference images not displayed]

FINDINGS: Brain: Image quality degraded by mild motion

Mild generalized atrophy. Negative for hydrocephalus. Negative for
acute infarct. Mild changes in the white matter consistent with
chronic ischemia. Brainstem and basal ganglia normal. Negative for
hemorrhage or mass. Small bilateral subdural hygromas around both
cerebral hemispheres and the cerebellum similar to the prior MRI. No
midline shift.

Vascular: Normal arterial flow voids

Skull and upper cervical spine: Negative

Sinuses/Orbits: Mild mucosal edema paranasal sinuses. Bilateral
cataract removal

Other: None
IMPRESSION: No acute abnormality

Mild atrophy and mild chronic microvascular ischemic change in the
white matter.

## 2018-03-19 NOTE — [facility] Group Notes (Signed)
LCSW Group Therapy Note  03/19/2018 1:00 pm  Type of Therapy and Topic:  Group Therapy:  Feelings around Relapse and Recovery  Participation Level:  Did Not Attend   Description of Group:    Patients in this group will discuss emotions they experience before and after a relapse. They will process how experiencing these feelings, or avoidance of experiencing them, relates to having a relapse. Facilitator will guide patients to explore emotions they have related to recovery. Patients will be encouraged to process which emotions are more powerful. They will be guided to discuss the emotional reaction significant others in their lives may have to their relapse or recovery. Patients will be assisted in exploring ways to respond to the emotions of others without this contributing to a relapse.  Therapeutic Goals: 1. Patient will identify two or more emotions that lead to a relapse for them 2. Patient will identify two emotions that result when they relapse 3. Patient will identify two emotions related to recovery 4. Patient will demonstrate ability to communicate their needs through discussion and/or role plays   Summary of Patient Progress:  Aritza was invited to today's group, but chose not to attend.   Therapeutic Modalities:   Cognitive Behavioral Therapy Solution-Focused Therapy Assertiveness Training Relapse Prevention Therapy   Devona Konig, LCSW 03/19/2018 1:38 PM

## 2018-03-19 NOTE — [facility] Group Notes (Signed)
[place] Group Notes:  (Nursing/MHT/Case Management/Adjunct)  Date:  03/19/2018  Time:  1:57 AM  Type of Therapy:  Group Therapy  Participation Level:  Did Not Attend  Ivar Drape 03/19/2018, 1:57 AM

## 2018-03-19 NOTE — Progress Notes (Signed)
Post HD Treatment  Pt tolerated HD treatment well. She has no complaints at this time. Her Net UF was 2032 and her BVP was 80.4. Report called to primary RN.     03/19/18 1400  Hand-Off documentation  Report given to (Full Name)  (Nurse phoned for report was Gigi Maniattu)  Vital Signs  Temp 97.7 F (36.5 C)  Temp Source Oral  Pulse Rate 78  Pulse Rate Source Monitor  Resp 18  BP (!) 125/52  BP Location Left Arm  BP Method Automatic  Patient Position (if appropriate) Sitting  Oxygen Therapy  SpO2 100 %  O2 Device Room Air  Pain Assessment  Pain Scale 0-10  Pain Score 0  Dialysis Weight  Weight 66 kg  Type of Weight Post-Dialysis  Post-Hemodialysis Assessment  Rinseback Volume (mL) 250 mL  KECN 80.4 V  Dialyzer Clearance Lightly streaked  Duration of HD Treatment -hour(s) 3.5 hour(s)  Hemodialysis Intake (mL) 500 mL  UF Total -Machine (mL) 2532 mL  Net UF (mL) 2032 mL  Tolerated HD Treatment Yes  AVG/AVF Arterial Site Held (minutes) 10 minutes  AVG/AVF Venous Site Held (minutes) 10 minutes  Fistula / Graft Left Upper arm Arteriovenous fistula  Placement Date: 08/13/15   Placed prior to admission: Yes  Orientation: Left  Access Location: Upper arm  Access Type: Arteriovenous fistula  Site Condition No complications  Fistula / Graft Assessment Present;Thrill;Bruit  Drainage Description None

## 2018-03-19 NOTE — Progress Notes (Signed)
Recreation Therapy Notes  Date: 03/19/2018  Time: 9:30 am   Location: Outside   Behavioral response: N/A   Intervention Topic: Leisure  Discussion/Intervention: Patient did not attend group.   Clinical Observations/Feedback:  Patient did not attend group.   Nicki Gracy LRT/CTRS        Joyce [name] 03/19/2018 10:25 AM

## 2018-03-19 NOTE — Progress Notes (Signed)
HD Treatment Complete    03/19/18 1345  Vital Signs  Pulse Rate 75  Pulse Rate Source Monitor  Resp 16  BP (!) 132/46  BP Location Left Arm  BP Method Automatic  Patient Position (if appropriate) Sitting  Oxygen Therapy  SpO2 100 %  O2 Device Room Air  During Hemodialysis Assessment  Blood Flow Rate (mL/min) 400 mL/min  Arterial Pressure (mmHg) -180 mmHg  Venous Pressure (mmHg) 200 mmHg  Transmembrane Pressure (mmHg) 70 mmHg  Ultrafiltration Rate (mL/min) 720 mL/min  Dialysate Flow Rate (mL/min) 600 ml/min  Conductivity: Machine  14.9  HD Safety Checks Performed Yes  Intra-Hemodialysis Comments Tolerated well;Tx completed  Fistula / Graft Left Upper arm Arteriovenous fistula  Placement Date: 08/13/15   Placed prior to admission: Yes  Orientation: Left  Access Location: Upper arm  Access Type: Arteriovenous fistula  Status Deaccessed

## 2018-03-19 NOTE — Progress Notes (Signed)
   03/19/18 0935  Vital Signs  Pulse Rate Source Monitor  Resp 18  BP (!) 111/45  BP Location Right Arm  BP Method Automatic  Patient Position (if appropriate) Sitting  Oxygen Therapy  SpO2 100 %  O2 Device Room Air  Pain Assessment  Pain Scale 0-10  Pain Score 0

## 2018-03-19 NOTE — Progress Notes (Signed)
Treatment Initiated    03/19/18 1006  During Hemodialysis Assessment  Blood Flow Rate (mL/min) 400 mL/min  Arterial Pressure (mmHg) -160 mmHg  Venous Pressure (mmHg) 180 mmHg  Transmembrane Pressure (mmHg) 70 mmHg  Ultrafiltration Rate (mL/min) 710 mL/min  Dialysate Flow Rate (mL/min) 600 ml/min  Conductivity: Machine  14  HD Safety Checks Performed Yes  Dialysis Fluid Bolus Normal Saline  Bolus Amount (mL) 250 mL  Intra-Hemodialysis Comments Tx initiated  Fistula / Graft Left Upper arm Arteriovenous fistula  Placement Date: 08/13/15   Placed prior to admission: Yes  Orientation: Left  Access Location: Upper arm  Access Type: Arteriovenous fistula  Status Accessed  Needle Size 15

## 2018-03-19 NOTE — Progress Notes (Signed)
Recreation Therapy Notes  INPATIENT RECREATION TR PLAN  Patient Details Name: Kinza Gouveia MRN: [id number] DOB: [birth year] Today's Date: 03/19/2018  Rec Therapy Plan Is patient appropriate for Therapeutic Recreation?: Yes Treatment times per week: at least 3 Estimated Length of Stay: 5-7 days TR Treatment/Interventions: Group participation (Comment)  Discharge Criteria Pt will be discharged from therapy if:: Discharged Treatment plan/goals/alternatives discussed and agreed upon by:: Patient/family  Discharge Summary Short term goals set: Patient will identify 3 positive coping skills strategies to use post d/c for self-harm within 5 recreation therapy group sessions Short term goals met: Adequate for discharge Progress toward goals comments: Groups attended Which groups?: Communication, Goal setting, Other (Comment)(Emotions, Problem Solving) Reason goals not met: N/A Therapeutic equipment acquired: N/A Reason patient discharged from therapy: Discharge from hospital Pt/family agrees with progress & goals achieved: Yes Date patient discharged from therapy: 03/19/18   [place] 03/19/2018, 1:08 PM

## 2018-03-19 NOTE — Progress Notes (Signed)
Patient denies SI/HI, denies A/V hallucinations. Patient and family verbalizes understanding of discharge instructions, follow up care and prescriptions.No personal belongings in the locker. Patient escorted out by staff, transported by family.

## 2018-03-19 NOTE — Progress Notes (Signed)
Pre HD Treatment    03/19/18 0935  Vital Signs  Temp 98.8 F (37.1 C)  Temp Source Oral  Pulse Rate 64  Pulse Rate Source Monitor  Resp 18  BP (!) 111/45  BP Location Right Arm  BP Method Automatic  Patient Position (if appropriate) Sitting  Oxygen Therapy  SpO2 100 %  O2 Device Room Air  Pain Assessment  Pain Scale 0-10  Pain Score 0  Dialysis Weight  Weight 68 kg  Type of Weight Pre-Dialysis  Time-Out for Hemodialysis  What Procedure? HD  Pt Identifiers(min of two) First/Last Name;MRN/Account#;Pt's DOB(use if MRN/Acct# not available  Correct Site? Yes  Correct Side? Yes  Correct Procedure? Yes  Consents Verified? Yes  Rad Studies Available? N/A  Safety Precautions Reviewed? Yes  [employer] Number [id number]  Station Number 2  UF/Alarm Test Passed  Conductivity: Meter 14  Conductivity: Machine  14  pH 7.6  Reverse Osmosis Main  Normal Saline Lot Number [id number]  Dialyzer Lot Number [id number]  Disposable Set Lot Number [id number]  Machine Temperature 98.6 F (37 C)  Musician and Audible Yes  Blood Lines Intact and Secured Yes  Pre Treatment Patient Checks  Vascular access used during treatment Fistula  Patient is receiving dialysis in a chair Yes  Hepatitis B Surface Antigen Results Negative  Date Hepatitis B Surface Antigen Drawn 01/06/18  Hepatitis B Surface Antibody 17  Date Hepatitis B Surface Antibody Drawn 02/24/18  Hemodialysis Consent Verified Yes  Hemodialysis Standing Orders Initiated Yes  ECG (Telemetry) Monitor On Yes  Prime Ordered Normal Saline  Length of  DialysisTreatment -hour(s) 3.5 Hour(s)  Dialyzer Elisio 17H NR  Dialysate 3K, 2.5 Ca  Dialysis Anticoagulant None  Dialysate Flow Ordered 600  Blood Flow Rate Ordered 400 mL/min  Ultrafiltration Goal 2 Liters  Pre Treatment Labs CBC;Phosphorus  Dialysis Blood Pressure Support Ordered Normal Saline  Education / Care Plan  Dialysis Education Provided Yes  Documented  Education in Care Plan Yes  Fistula / Graft Left Upper arm Arteriovenous fistula  Placement Date: 08/13/15   Placed prior to admission: Yes  Orientation: Left  Access Location: Upper arm  Access Type: Arteriovenous fistula  Site Condition No complications  Fistula / Graft Assessment Present;Thrill;Bruit  Drainage Description None

## 2018-03-19 NOTE — Progress Notes (Signed)
  [facility] Adult Case Management Discharge Plan :  Will you be returning to the same living situation after discharge:  Yes,  Home with her son and daughter-in-law At discharge, do you have transportation home?: Yes,  Family will come at discharge Do you have the ability to pay for your medications: Yes,  Insurance  Release of information consent forms completed and in the chart;  Patient's signature needed at discharge.  Patient to Follow up at: Follow-up Information    [facility]. Go on 03/22/2018.   Why:  Please follow up for Dialysis treatment on Mondays, Wednesday and Fridays at 11:15am. Thank you. Contact information: Address: [address] [place], [place] [address] Phone: [phone]       [facility]. Go on 03/25/2018.   Why:  Please follow up with Dr. Binnie Rail on Thursday March 25, 2018 at 11am. Thank you. Contact information: Address: [address], [place], [place] [address] Phone: [phone] Fax: [phone]          Next level of care provider has access to [place] and Suicide Prevention discussed: Yes,  Completed with family and patient  Have you used any form of tobacco in the last 30 days? (Cigarettes, Smokeless Tobacco, Cigars, and/or Pipes): No  Has patient been referred to the Quitline?: N/A patient is not a smoker  Patient has been referred for addiction treatment: N/A  Darin Engels, LCSW 03/19/2018, 8:23 AM

## 2018-03-19 NOTE — Progress Notes (Signed)
[facility]  ROUNDING NOTE   Subjective:   Seen and examined on hemodialysis treatment. Tolerating treatment well.  Seated in chair.    HEMODIALYSIS FLOWSHEET:  Blood Flow Rate (mL/min): 400 mL/min Arterial Pressure (mmHg): -170 mmHg Venous Pressure (mmHg): 200 mmHg Transmembrane Pressure (mmHg): 70 mmHg Ultrafiltration Rate (mL/min): 710 mL/min Dialysate Flow Rate (mL/min): 600 ml/min Conductivity: Machine : 14 Conductivity: Machine : 14 Dialysis Fluid Bolus: Normal Saline Bolus Amount (mL): 250 mL    Objective:  Vital signs in last 24 hours:  Temp:  [98.6 F (37 C)-98.8 F (37.1 C)] 98.8 F (37.1 C) (08/30 0935) Pulse Rate:  [60-79] 71 (08/30 1245) Resp:  [12-18] 16 (08/30 1245) BP: (109-129)/(45-59) 116/58 (08/30 1245) SpO2:  [98 %-100 %] 100 % (08/30 1245) Weight:  [68 kg] 68 kg (08/30 0935)  Weight change:  Filed Weights   03/17/18 1009 03/17/18 1400 03/19/18 0935  Weight: 68.1 kg 66 kg 68 kg    Intake/Output: I/O last 3 completed shifts: In: 85 [P.O.:720] Out: -    Intake/Output this shift:  Total I/O In: 240 [P.O.:240] Out: -   Physical Exam: General: NAD, sitting in chair  Head: Normocephalic, atraumatic. Moist oral mucosal membranes  Eyes: Anicteric, PERRL  Neck: Supple, trachea midline  Lungs:  Clear to auscultation  Heart: Regular rate and rhythm  Abdomen:  Soft, nontender,   Extremities: no peripheral edema.  Neurologic: Nonfocal, moving all four extremities  Skin: No lesions  Access: Left AVF    Basic Metabolic Panel: Recent Labs  Lab 03/13/18 1039 03/15/18 1159 03/18/18 0642  NA  --  132* 136  K  --  4.1 3.8  CL  --  95* 95*  CO2  --  27 31  GLUCOSE  --  116* 101*  BUN  --  50* 28*  CREATININE  --  5.22* 3.58*  CALCIUM  --  8.8* 9.0  PHOS 4.5 3.3 3.8    Liver Function Tests: Recent Labs  Lab 03/15/18 1159 03/18/18 0642  ALBUMIN 3.1* 3.2*   No results for input(s): LIPASE, AMYLASE in the last 168  hours. No results for input(s): AMMONIA in the last 168 hours.  CBC: Recent Labs  Lab 03/15/18 1150 03/18/18 0642  WBC 9.2 9.8  HGB 8.7* 8.7*  HCT 24.9* 25.0*  MCV 97.5 99.0  PLT 259 241    Cardiac Enzymes: No results for input(s): CKTOTAL, CKMB, CKMBINDEX, TROPONINI in the last 168 hours.  BNP: Invalid input(s): POCBNP  CBG: No results for input(s): GLUCAP in the last 168 hours.  Microbiology: Results for orders placed or performed during the hospital encounter of 03/04/18  Urine culture     Status: None   Collection Time: 03/04/18 11:03 AM  Result Value Ref Range Status   Specimen Description   Final    URINE, RANDOM Performed at [facility], [address], [place], [place] [address]    Special Requests   Final    NONE Performed at [facility], [address], [place], [place] [address]    Culture   Final    NO GROWTH Performed at [facility], [place] [address] [place],  [address]    Report Status 03/06/2018 FINAL  Final  Blood Cultures (routine x 2)     Status: Abnormal   Collection Time: 03/04/18 11:18 AM  Result Value Ref Range Status   Specimen Description   Final    BLOOD RIGHT ARM Performed at [facility], [place] [address], [place], [place] [address]    Special Requests   Final    BOTTLES DRAWN AEROBIC AND ANAEROBIC Blood Culture adequate volume Performed at [facility], [address] [place], [place] [address]    Culture  Setup Time   Final    GRAM POSITIVE COCCI AEROBIC BOTTLE ONLY CRITICAL RESULT CALLED TO, READ BACK BY AND VERIFIED WITH: DAVID BESANTI ON 03/05/18 AT 0109 JAG    Culture (A)  Final    VIRIDANS STREPTOCOCCUS THE SIGNIFICANCE OF ISOLATING THIS ORGANISM FROM A SINGLE SET OF BLOOD CULTURES WHEN MULTIPLE SETS ARE DRAWN IS UNCERTAIN. PLEASE NOTIFY THE MICROBIOLOGY DEPARTMENT WITHIN ONE WEEK IF SPECIATION AND SENSITIVITIES ARE REQUIRED. Performed at [facility], [place]  [address] [place], [place] [address]    Report Status 03/07/2018 FINAL  Final  Blood Culture ID Panel (Reflexed)     Status: Abnormal   Collection Time: 03/04/18 11:18 AM  Result Value Ref Range Status   Enterococcus species NOT DETECTED NOT DETECTED Final   Listeria monocytogenes NOT DETECTED NOT DETECTED Final   Staphylococcus species NOT DETECTED NOT DETECTED Final   Staphylococcus aureus NOT DETECTED NOT DETECTED Final   Streptococcus species DETECTED (A) NOT DETECTED Final    Comment: Not Enterococcus species, Streptococcus agalactiae, Streptococcus pyogenes, or Streptococcus pneumoniae. CRITICAL RESULT CALLED TO, READ BACK BY AND VERIFIED WITH: DAVID BESANTI ON 03/05/18 AT 0109 BY JAG    Streptococcus agalactiae NOT DETECTED NOT DETECTED Final   Streptococcus pneumoniae NOT DETECTED NOT DETECTED Final   Streptococcus pyogenes NOT DETECTED NOT DETECTED Final   Acinetobacter baumannii NOT DETECTED NOT DETECTED Final   Enterobacteriaceae species NOT DETECTED NOT DETECTED Final   Enterobacter cloacae complex NOT DETECTED NOT DETECTED Final   Escherichia coli NOT DETECTED NOT DETECTED Final   Klebsiella oxytoca NOT DETECTED NOT DETECTED Final   Klebsiella pneumoniae NOT DETECTED NOT DETECTED Final   Proteus species NOT DETECTED NOT DETECTED Final   Serratia marcescens NOT DETECTED NOT DETECTED Final   Haemophilus influenzae NOT DETECTED NOT DETECTED Final   Neisseria meningitidis NOT DETECTED NOT DETECTED Final   Pseudomonas aeruginosa NOT DETECTED NOT DETECTED Final   Candida albicans NOT DETECTED NOT DETECTED Final   Candida glabrata NOT DETECTED NOT DETECTED Final   Candida krusei NOT DETECTED NOT DETECTED Final   Candida parapsilosis NOT DETECTED NOT DETECTED Final   Candida tropicalis NOT DETECTED NOT DETECTED Final    Comment: Performed at [facility], [address] [place], [place] [address]  MRSA PCR Screening     Status: None   Collection Time: 03/04/18 12:37 PM   Result Value Ref Range Status   MRSA by PCR NEGATIVE NEGATIVE Final    Comment:        The GeneXpert MRSA Assay (FDA approved for NASAL specimens only), is one component of a comprehensive MRSA colonization surveillance program. It is not intended to diagnose MRSA infection nor to guide or monitor treatment for MRSA infections. Performed at [facility], [address] [place],  [address]   Culture, blood (Routine X 2) w Reflex to ID Panel     Status: None   Collection Time: 03/05/18  2:02 AM  Result Value Ref Range Status   Specimen Description BLOOD RIGHT HAND  Final   Special Requests   Final    BOTTLES DRAWN AEROBIC ONLY Blood Culture results may not be optimal due to an inadequate volume of blood received in  culture bottles   Culture   Final    NO GROWTH 5 DAYS Performed at [facility], [address] [place], [place] [address]    Report Status 03/10/2018 FINAL  Final  Culture, blood (Routine X 2) w Reflex to ID Panel     Status: None   Collection Time: 03/05/18  2:03 AM  Result Value Ref Range Status   Specimen Description BLOOD RIGHT HAND  Final   Special Requests   Final    BOTTLES DRAWN AEROBIC ONLY Blood Culture results may not be optimal due to an inadequate volume of blood received in culture bottles   Culture   Final    NO GROWTH 5 DAYS Performed at [facility], [address], [place], [place] [address]    Report Status 03/10/2018 FINAL  Final    Coagulation Studies: No results for input(s): LABPROT, INR in the last 72 hours.  Urinalysis: No results for input(s): COLORURINE, LABSPEC, PHURINE, GLUCOSEU, HGBUR, BILIRUBINUR, KETONESUR, PROTEINUR, UROBILINOGEN, NITRITE, LEUKOCYTESUR in the last 72 hours.  Invalid input(s): APPERANCEUR    Imaging: No results found.   Medications:    . brimonidine  1 drop Both Eyes BID  . carbamazepine  200 mg Oral TID  . dicyclomine  20 mg Oral TID AC  . epoetin  (EPOGEN/PROCRIT) injection  10,000 Units Intravenous Q M,W,F-HD  . famotidine  20 mg Oral Daily  . feeding supplement (NEPRO CARB STEADY)  237 mL Oral BID BM  . metoprolol tartrate  25 mg Oral BID  . multivitamin  1 tablet Oral QHS  . rOPINIRole  2 mg Oral Daily  . timolol  1 drop Both Eyes BID  . vitamin C  250 mg Oral BID  . ziprasidone  60 mg Oral BID WC   acetaminophen, alum & mag hydroxide-simeth, magnesium hydroxide, pentafluoroprop-tetrafluoroeth, pentafluoroprop-tetrafluoroeth  Assessment/ Plan:  Joyce [name] is a 72 y.o. white female with end stage renal disease on hemodialysis, hypertension, bipolar disorder, GERD, polycystic kidney disease  [facility] MWF left AVF  1. End Stage Renal Disease: seen and examined on hemodialysis treatment. Tolerating treatment well.  - Continue MWF schedule. - Outpatient planning for [facility] M[facility] second shift  2. Hypertension:   - metoprolol  3. Anemia of chronic kidney disease:  - EPO with HD treatment  4.  Secondary hyperparathyroidism: phosphorus at goal.  - holding binders.    LOS: [place] 8/30/201912:57 PM

## 2018-03-19 NOTE — Progress Notes (Signed)
Patient ID: Joyce [name], female   DOB: 16-Oct-1945, 72 y.o.   MRN: [id number]   Psychiatry: Patient seen prior to discharge.  Patient well-known to me from previous treatment.  72 year old woman with bipolar disorder status post recent suicide attempt who has been treated on the inpatient unit and stabilized.  I spoke with Dr. Bary Leriche today and reviewed the notes.  Everything had been prepared for an appropriate discharge after dialysis.  Patient was agreeable to going back to stay with her son and her son's family and they were also agreeable to the plan.  She is denying any suicidal thoughts.  She is agreeable to continuing medication and dialysis at this point.  Patient was alert and oriented with euthymic appropriate affect and behavior.  Reviewed plan with nursing.  Treatment team appears to be all on board with appropriate discharge.  Medications have all been ordered suicide assessment completed.  Dr. Mamie Nick had asked me to put in the actual discharge order as she had been urgently called away from the hospital.  I was glad to do so and the patient has now been discharged.

## 2018-03-19 NOTE — Progress Notes (Signed)
HD Pre Assessment    03/19/18 0940  Neurological  Level of Consciousness Alert  Orientation Level Oriented to person;Oriented to place;Oriented to situation;Disoriented to time  Respiratory  Respiratory Pattern Regular;Symmetrical;Unlabored  Chest Assessment Chest expansion symmetrical  Bilateral Breath Sounds Clear  Cough None  Cardiac  Pulse Regular  Heart Sounds S1, S2  Jugular Venous Distention (JVD) No  ECG Monitor Yes  Cardiac Rhythm NSR  Vascular  R Radial Pulse +2  L Radial Pulse +2  Edema Generalized  Integumentary  Integumentary (WDL) WDL  Musculoskeletal  Musculoskeletal (WDL) X  Generalized Weakness Yes  GU Assessment  Genitourinary (WDL) X (HD pt)  Psychosocial  Psychosocial (WDL) WDL

## 2018-03-19 NOTE — Progress Notes (Signed)
PT Cancellation Note  Patient Details Name: Joyce [name] MRN: [id number] DOB: Nov 13, 1945   Cancelled Treatment:    Reason Eval/Treat Not Completed: Patient at procedure or test/unavailable.  Pt currently receiving dialysis.  Will re-attempt later when time allows.   Roxanne Gates, PT, DPT 03/19/2018, 1:42 PM

## 2018-03-20 DIAGNOSIS — F315 Bipolar disorder, current episode depressed, severe, with psychotic features: Secondary | ICD-10-CM | POA: Diagnosis not present

## 2018-03-20 DIAGNOSIS — N186 End stage renal disease: Secondary | ICD-10-CM | POA: Diagnosis not present

## 2018-03-20 DIAGNOSIS — D120 Benign neoplasm of cecum: Secondary | ICD-10-CM | POA: Diagnosis not present

## 2018-03-20 DIAGNOSIS — I120 Hypertensive chronic kidney disease with stage 5 chronic kidney disease or end stage renal disease: Secondary | ICD-10-CM | POA: Diagnosis not present

## 2018-03-20 DIAGNOSIS — R1310 Dysphagia, unspecified: Secondary | ICD-10-CM | POA: Diagnosis not present

## 2018-03-20 DIAGNOSIS — K219 Gastro-esophageal reflux disease without esophagitis: Secondary | ICD-10-CM | POA: Diagnosis not present

## 2018-03-20 DIAGNOSIS — E1122 Type 2 diabetes mellitus with diabetic chronic kidney disease: Secondary | ICD-10-CM | POA: Diagnosis not present

## 2018-03-20 DIAGNOSIS — R2689 Other abnormalities of gait and mobility: Secondary | ICD-10-CM | POA: Diagnosis not present

## 2018-03-20 DIAGNOSIS — D631 Anemia in chronic kidney disease: Secondary | ICD-10-CM | POA: Diagnosis not present

## 2018-04-01 ENCOUNTER — Ambulatory Visit (INDEPENDENT_AMBULATORY_CARE_PROVIDER_SITE_OTHER): Payer: Medicare HMO | Admitting: Vascular Surgery

## 2018-04-01 ENCOUNTER — Ambulatory Visit (INDEPENDENT_AMBULATORY_CARE_PROVIDER_SITE_OTHER): Payer: Medicare HMO

## 2018-04-01 ENCOUNTER — Encounter (INDEPENDENT_AMBULATORY_CARE_PROVIDER_SITE_OTHER): Payer: Self-pay | Admitting: Vascular Surgery

## 2018-04-01 VITALS — BP 168/69 | HR 82 | Resp 12 | Ht 63.0 in | Wt 148.0 lb

## 2018-04-01 DIAGNOSIS — T829XXS Unspecified complication of cardiac and vascular prosthetic device, implant and graft, sequela: Secondary | ICD-10-CM

## 2018-04-01 DIAGNOSIS — I159 Secondary hypertension, unspecified: Secondary | ICD-10-CM | POA: Diagnosis not present

## 2018-04-01 DIAGNOSIS — Z992 Dependence on renal dialysis: Secondary | ICD-10-CM

## 2018-04-01 DIAGNOSIS — N186 End stage renal disease: Secondary | ICD-10-CM | POA: Diagnosis not present

## 2018-04-06 DIAGNOSIS — D120 Benign neoplasm of cecum: Secondary | ICD-10-CM | POA: Diagnosis not present

## 2018-04-06 DIAGNOSIS — F315 Bipolar disorder, current episode depressed, severe, with psychotic features: Secondary | ICD-10-CM | POA: Diagnosis not present

## 2018-04-06 DIAGNOSIS — K219 Gastro-esophageal reflux disease without esophagitis: Secondary | ICD-10-CM | POA: Diagnosis not present

## 2018-04-06 DIAGNOSIS — E1122 Type 2 diabetes mellitus with diabetic chronic kidney disease: Secondary | ICD-10-CM | POA: Diagnosis not present

## 2018-04-06 DIAGNOSIS — I120 Hypertensive chronic kidney disease with stage 5 chronic kidney disease or end stage renal disease: Secondary | ICD-10-CM | POA: Diagnosis not present

## 2018-04-06 DIAGNOSIS — R2689 Other abnormalities of gait and mobility: Secondary | ICD-10-CM | POA: Diagnosis not present

## 2018-04-06 DIAGNOSIS — R1310 Dysphagia, unspecified: Secondary | ICD-10-CM | POA: Diagnosis not present

## 2018-04-06 DIAGNOSIS — D631 Anemia in chronic kidney disease: Secondary | ICD-10-CM | POA: Diagnosis not present

## 2018-04-06 DIAGNOSIS — N186 End stage renal disease: Secondary | ICD-10-CM | POA: Diagnosis not present

## 2018-04-08 DIAGNOSIS — D120 Benign neoplasm of cecum: Secondary | ICD-10-CM | POA: Diagnosis not present

## 2018-04-08 DIAGNOSIS — K219 Gastro-esophageal reflux disease without esophagitis: Secondary | ICD-10-CM | POA: Diagnosis not present

## 2018-04-08 DIAGNOSIS — N186 End stage renal disease: Secondary | ICD-10-CM | POA: Diagnosis not present

## 2018-04-08 DIAGNOSIS — R1310 Dysphagia, unspecified: Secondary | ICD-10-CM | POA: Diagnosis not present

## 2018-04-08 DIAGNOSIS — F315 Bipolar disorder, current episode depressed, severe, with psychotic features: Secondary | ICD-10-CM | POA: Diagnosis not present

## 2018-04-08 DIAGNOSIS — I120 Hypertensive chronic kidney disease with stage 5 chronic kidney disease or end stage renal disease: Secondary | ICD-10-CM | POA: Diagnosis not present

## 2018-04-08 DIAGNOSIS — D631 Anemia in chronic kidney disease: Secondary | ICD-10-CM | POA: Diagnosis not present

## 2018-04-08 DIAGNOSIS — R2689 Other abnormalities of gait and mobility: Secondary | ICD-10-CM | POA: Diagnosis not present

## 2018-04-08 DIAGNOSIS — E1122 Type 2 diabetes mellitus with diabetic chronic kidney disease: Secondary | ICD-10-CM | POA: Diagnosis not present

## 2018-04-11 ENCOUNTER — Encounter (INDEPENDENT_AMBULATORY_CARE_PROVIDER_SITE_OTHER): Payer: Self-pay | Admitting: Vascular Surgery

## 2018-04-11 NOTE — Progress Notes (Signed)
MRN : [id number]  Joyce [name] is a 72 y.o. (1945-08-13) female who presents with chief complaint of No chief complaint on file. Marland Kitchen  History of Present Illness:  The patient returns to the office for followup status post intervention of the dialysis access 11/02/2017.  PROCEDURE: 1.   Left brachiocephalic arteriovenous fistula cannulation under ultrasound guidance 2.   Left arm fistulagram including central venogram 3.   Percutaneous transluminal angioplasty of left upper arm cephalic vein and cephalic vein subclavian vein confluence with 2 inflations with a 6 mm diameter by 22 cm length Lutonix drug-coated angioplasty balloon 4.   Viabahn stent placement to the left upper arm cephalic vein and cephalic vein subclavian vein confluence with an 8 mm diameter by 25 cm length covered stent   Following the intervention the access function has significantly improved, with better flow rates and improved KT/V. The patient has not been experiencing increased bleeding times following decannulation and the patient denies increased recirculation. The patient denies an increase in arm swelling. At the present time the patient denies hand pain.  The patient denies amaurosis fugax or recent TIA symptoms. There are no recent neurological changes noted. The patient denies claudication symptoms or rest pain symptoms. The patient denies history of DVT, PE or superficial thrombophlebitis. The patient denies recent episodes of angina or shortness of breath.   Duplex ultrasound of the AV access shows a patent access.  The previously noted stenosis is unchanged compared to last study.     Current Meds  Medication Sig  . acetaminophen (TYLENOL) 325 MG tablet Take 325 mg 2 (two) times daily as needed by mouth for moderate pain or headache.   . brimonidine (ALPHAGAN) 0.2 % ophthalmic solution Place 1 drop into both eyes 2 (two) times daily.  . carbamazepine (TEGRETOL) 200 MG tablet Take 1 tablet (200 mg  total) by mouth 3 (three) times daily.  . cholecalciferol (VITAMIN D) 1000 UNITS tablet Take 1,000 Units by mouth daily.  Marland Kitchen dicyclomine (BENTYL) 20 MG tablet Take 1 tablet (20 mg total) by mouth 3 (three) times daily before meals.  . famotidine (PEPCID) 20 MG tablet Take 1 tablet (20 mg total) by mouth daily.  Marland Kitchen lidocaine-prilocaine (EMLA) cream Apply 1 application every Monday, Wednesday, and Friday topically. At dialysis  . midodrine (PROAMATINE) 10 MG tablet Take 10 mg by mouth 3 (three) times daily.  . multivitamin (RENA-VIT) TABS tablet TAKE 1 TABLET BY MOUTH EVERYDAY AT BEDTIME (Patient taking differently: No sig reported)  . pantoprazole (PROTONIX) 40 MG tablet Take 40 mg by mouth daily.  Marland Kitchen rOPINIRole (REQUIP) 2 MG tablet Take 1 tablet (2 mg total) by mouth daily.  . timolol (TIMOPTIC) 0.5 % ophthalmic solution Place 1 drop into both eyes 2 (two) times daily.  . ziprasidone (GEODON) 60 MG capsule Take 1 capsule (60 mg total) by mouth 2 (two) times daily with a meal.    Past Medical History:  Diagnosis Date  . Anal fissure   . Bipolar affective disorder ([place])   . CKD (chronic kidney disease)    Dr Holley Raring [name] 4  . Colon polyps   . Diverticulitis   . Diverticulitis   . Family history of adverse reaction to anesthesia    mom - PONV  . GERD (gastroesophageal reflux disease)   . Headache    migraines - none over 10 yrs  . Heart murmur   . History of hiatal hernia   . Hypertension   . Pancreatitis  Valproic acid  . Vertigo     Past Surgical History:  Procedure Laterality Date  . A/V FISTULAGRAM Left 04/14/2017   Procedure: A/V Fistulagram;  Surgeon: Katha Cabal, MD;  Location: [place] [facility];  Service: Cardiovascular;  Laterality: Left;  . A/V FISTULAGRAM Left 06/09/2017   Procedure: A/V FISTULAGRAM;  Surgeon: Katha Cabal, MD;  Location: [place] [facility];  Service: Cardiovascular;  Laterality: Left;  . A/V FISTULAGRAM Left 11/02/2017   Procedure:  A/V FISTULAGRAM;  Surgeon: Algernon Huxley, MD;  Location: [place] [facility];  Service: Cardiovascular;  Laterality: Left;  . A/V SHUNT INTERVENTION N/A 11/02/2017   Procedure: A/V SHUNT INTERVENTION;  Surgeon: Algernon Huxley, MD;  Location: [place] [facility];  Service: Cardiovascular;  Laterality: N/A;  . ABDOMINAL HYSTERECTOMY  1990 ?  . AV FISTULA PLACEMENT  4/30  . BREAST EXCISIONAL BIOPSY Left 1994   neg surgical bx  . CHOLECYSTECTOMY  2003  . COLONOSCOPY  2014   Dr. Jamal Collin  . COLONOSCOPY WITH PROPOFOL N/A 09/24/2015   Procedure: COLONOSCOPY WITH random colon byopies.;  Surgeon: Lucilla Lame, MD;  Location: [place];  Service: Endoscopy;  Laterality: N/A;  . ESOPHAGOGASTRODUODENOSCOPY (EGD) WITH PROPOFOL N/A 09/24/2015   Procedure: ESOPHAGOGASTRODUODENOSCOPY (EGD) ;  Surgeon: Lucilla Lame, MD;  Location: [place];  Service: Endoscopy;  Laterality: N/A;  . EYE SURGERY    . PERIPHERAL VASCULAR CATHETERIZATION N/A 05/29/2015   Procedure: A/V Shuntogram/Fistulagram;  Surgeon: Katha Cabal, MD;  Location: [place] [facility];  Service: Cardiovascular;  Laterality: N/A;  . PERIPHERAL VASCULAR CATHETERIZATION N/A 05/29/2015   Procedure: A/V Shunt Intervention;  Surgeon: Katha Cabal, MD;  Location: [place] [facility];  Service: Cardiovascular;  Laterality: N/A;  . POLYPECTOMY  09/24/2015   Procedure: POLYPECTOMY INTESTINAL;  Surgeon: Lucilla Lame, MD;  Location: [place];  Service: Endoscopy;;  cecal polyp ascending polyp    Social History Social History   Tobacco Use  . Smoking status: Never Smoker  . Smokeless tobacco: Never Used  Substance Use Topics  . Alcohol use: No    Alcohol/week: 0.0 standard drinks  . Drug use: No    Family History Family History  Problem Relation Age of Onset  . Stroke Father   . Hypertension Father   . Breast cancer Other   . Colon cancer Neg Hx   . Liver disease Neg Hx     Allergies  Allergen Reactions  .  Morphine And Related Shortness Of Breath    Pt reports chest pain and difficulty breathing.  . Indomethacin Hives  . Pollen Extract Other (See Comments)    Sinus problems and HA     REVIEW OF SYSTEMS (Negative unless checked)  Constitutional: [] Weight loss  [] Fever  [] Chills Cardiac: [] Chest pain   [] Chest pressure   [] Palpitations   [] Shortness of breath when laying flat   [] Shortness of breath with exertion. Vascular:  [] Pain in legs with walking   [] Pain in legs at rest  [] History of DVT   [] Phlebitis   [] Swelling in legs   [] Varicose veins   [] Non-healing ulcers Pulmonary:   [] Uses home oxygen   [] Productive cough   [] Hemoptysis   [] Wheeze  [] COPD   [] Asthma Neurologic:  [] Dizziness   [] Seizures   [] History of stroke   [] History of TIA  [] Aphasia   [] Vissual changes   [] Weakness or numbness in arm   [] Weakness or numbness in leg Musculoskeletal:   [] Joint  swelling   [] Joint pain   [] Low back pain Hematologic:  [] Easy bruising  [] Easy bleeding   [] Hypercoagulable state   [] Anemic Gastrointestinal:  [] Diarrhea   [] Vomiting  [] Gastroesophageal reflux/heartburn   [] Difficulty swallowing. Genitourinary:  [x] Chronic kidney disease   [] Difficult urination  [] Frequent urination   [] Blood in urine Skin:  [] Rashes   [] Ulcers  Psychological:  [x] History of anxiety   [x]  History of major depression.  Physical Examination  Vitals:   04/01/18 1617  BP: (!) 168/69  Pulse: 82  Resp: 12  Weight: 148 lb (67.1 kg)  Height: 5\' 3"  (1.6 m)   Body mass index is 26.22 kg/m. Gen: WD/WN, NAD Head: [place]/AT, No temporalis wasting.  Ear/Nose/Throat: Hearing grossly intact, nares w/o erythema or drainage Eyes: PER, EOMI, sclera nonicteric.  Neck: Supple, no large masses.   Pulmonary:  Good air movement, no audible wheezing bilaterally, no use of accessory muscles.  Cardiac: RRR, no JVD Vascular: left arm good thrill good bruit Vessel Right Left  Radial Palpable Palpable  Ulnar Palpable Palpable    Brachial Palpable Palpable  Gastrointestinal: Non-distended. No guarding/no peritoneal signs.  Musculoskeletal: M/S 5/5 throughout.  No deformity or atrophy.  Neurologic: CN 2-12 intact. Symmetrical.  Speech is fluent. Motor exam as listed above. Psychiatric: Judgment intact, Mood & affect appropriate for pt's clinical situation. Dermatologic: No rashes or ulcers noted.  No changes consistent with cellulitis. Lymph : No lichenification or skin changes of chronic lymphedema.  CBC Lab Results  Component Value Date   WBC 9.8 03/18/2018   HGB 8.7 (L) 03/18/2018   HCT 25.0 (L) 03/18/2018   MCV 99.0 03/18/2018   PLT 241 03/18/2018    BMET    Component Value Date/Time   NA 136 03/18/2018 0642   NA 139 11/09/2014 1027   K 3.8 03/18/2018 0642   K 4.7 11/09/2014 1027   CL 95 (L) 03/18/2018 0642   CL 108 11/09/2014 1027   CO2 31 03/18/2018 0642   CO2 25 11/09/2014 1027   GLUCOSE 101 (H) 03/18/2018 0642   GLUCOSE 100 (H) 11/09/2014 1027   BUN 28 (H) 03/18/2018 0642   BUN 51 (H) 11/09/2014 1027   CREATININE 3.58 (H) 03/18/2018 0642   CREATININE 2.98 (H) 11/09/2014 1027   CALCIUM 9.0 03/18/2018 0642   CALCIUM 8.9 11/09/2014 1027   GFRNONAA 12 (L) 03/18/2018 0642   GFRNONAA 15 (L) 11/09/2014 1027   GFRAA 14 (L) 03/18/2018 0642   GFRAA 18 (L) 11/09/2014 1027   CrCl cannot be calculated (Patient's most recent lab result is older than the maximum 21 days allowed.).  COAG Lab Results  Component Value Date   INR 1.08 10/03/2016   INR 1.0 11/09/2014    Radiology No results found.   Assessment/Plan 1. Complication of vascular access for dialysis, sequela Recommend:  The patient is doing well and currently has adequate dialysis access. The patient's dialysis center is not reporting any access issues. Flow pattern is stable when compared to the prior ultrasound.  The patient should have a duplex ultrasound of the dialysis access in 6 months. The patient will follow-up with  me in the office after each ultrasound    - VAS [place] (AVF, AVG); Future  2. ESRD on dialysis [facility]) Continue dialysis without interuption  3. Secondary hypertension Continue antihypertensive medications as already ordered, these medications have been reviewed and there are no changes at this time.     [name], MD  04/11/2018 4:59 PM

## 2018-04-15 DIAGNOSIS — F315 Bipolar disorder, current episode depressed, severe, with psychotic features: Secondary | ICD-10-CM | POA: Diagnosis not present

## 2018-04-15 DIAGNOSIS — K219 Gastro-esophageal reflux disease without esophagitis: Secondary | ICD-10-CM | POA: Diagnosis not present

## 2018-04-15 DIAGNOSIS — I120 Hypertensive chronic kidney disease with stage 5 chronic kidney disease or end stage renal disease: Secondary | ICD-10-CM | POA: Diagnosis not present

## 2018-04-15 DIAGNOSIS — R2689 Other abnormalities of gait and mobility: Secondary | ICD-10-CM | POA: Diagnosis not present

## 2018-04-15 DIAGNOSIS — D631 Anemia in chronic kidney disease: Secondary | ICD-10-CM | POA: Diagnosis not present

## 2018-04-15 DIAGNOSIS — E1122 Type 2 diabetes mellitus with diabetic chronic kidney disease: Secondary | ICD-10-CM | POA: Diagnosis not present

## 2018-04-15 DIAGNOSIS — R1310 Dysphagia, unspecified: Secondary | ICD-10-CM | POA: Diagnosis not present

## 2018-04-15 DIAGNOSIS — D120 Benign neoplasm of cecum: Secondary | ICD-10-CM | POA: Diagnosis not present

## 2018-04-15 DIAGNOSIS — N186 End stage renal disease: Secondary | ICD-10-CM | POA: Diagnosis not present

## 2018-05-24 ENCOUNTER — Telehealth (INDEPENDENT_AMBULATORY_CARE_PROVIDER_SITE_OTHER): Payer: Self-pay | Admitting: Vascular Surgery

## 2018-05-24 NOTE — Telephone Encounter (Signed)
I left the patient a message informing her she should contact the dialysis center and if there is any concerns they will contact our office.

## 2018-06-29 DIAGNOSIS — K219 Gastro-esophageal reflux disease without esophagitis: Secondary | ICD-10-CM | POA: Insufficient documentation

## 2018-06-29 DIAGNOSIS — R419 Unspecified symptoms and signs involving cognitive functions and awareness: Secondary | ICD-10-CM | POA: Insufficient documentation

## 2018-06-29 DIAGNOSIS — F3161 Bipolar disorder, current episode mixed, mild: Secondary | ICD-10-CM | POA: Insufficient documentation

## 2018-06-29 DIAGNOSIS — R011 Cardiac murmur, unspecified: Secondary | ICD-10-CM | POA: Insufficient documentation

## 2018-06-29 DIAGNOSIS — D519 Vitamin B12 deficiency anemia, unspecified: Secondary | ICD-10-CM | POA: Insufficient documentation

## 2018-06-29 DIAGNOSIS — H60392 Other infective otitis externa, left ear: Secondary | ICD-10-CM | POA: Insufficient documentation

## 2018-10-03 ENCOUNTER — Emergency Department
Admission: EM | Admit: 2018-10-03 | Discharge: 2018-10-04 | Disposition: A | Discharge status: Nursing Home (Includes ICF) | Payer: Medicare HMO | Attending: Emergency Medicine | Admitting: Emergency Medicine

## 2018-10-03 ENCOUNTER — Other Ambulatory Visit: Payer: Self-pay

## 2018-10-03 DIAGNOSIS — I120 Hypertensive chronic kidney disease with stage 5 chronic kidney disease or end stage renal disease: Secondary | ICD-10-CM | POA: Diagnosis not present

## 2018-10-03 DIAGNOSIS — R42 Dizziness and giddiness: Secondary | ICD-10-CM | POA: Insufficient documentation

## 2018-10-03 DIAGNOSIS — Z79899 Other long term (current) drug therapy: Secondary | ICD-10-CM | POA: Diagnosis not present

## 2018-10-03 DIAGNOSIS — N186 End stage renal disease: Secondary | ICD-10-CM | POA: Insufficient documentation

## 2018-10-03 DIAGNOSIS — N390 Urinary tract infection, site not specified: Secondary | ICD-10-CM | POA: Diagnosis not present

## 2018-10-03 LAB — URINALYSIS, COMPLETE (UACMP) WITH MICROSCOPIC
BILIRUBIN URINE: NEGATIVE
Bacteria, UA: NONE SEEN
GLUCOSE, UA: NEGATIVE mg/dL
Hgb urine dipstick: NEGATIVE
KETONES UR: NEGATIVE mg/dL
NITRITE: NEGATIVE
PH: 6 (ref 5.0–8.0)
Protein, ur: NEGATIVE mg/dL
Specific Gravity, Urine: 1.011 (ref 1.005–1.030)
WBC, UA: >50 WBC/hpf — ABNORMAL HIGH (ref 0–5)

## 2018-10-03 LAB — BASIC METABOLIC PANEL
Anion gap: 11 (ref 5–15)
BUN: 31 mg/dL — ABNORMAL HIGH (ref 8–23)
CALCIUM: 8.6 mg/dL — AB (ref 8.9–10.3)
CHLORIDE: 100 mmol/L (ref 98–111)
CO2: 26 mmol/L (ref 22–32)
CREATININE: 5.05 mg/dL — AB (ref 0.44–1.00)
GFR calc non Af Amer: 8 mL/min — ABNORMAL LOW (ref >60)
GFR, EST AFRICAN AMERICAN: 9 mL/min — AB (ref >60)
Glucose, Bld: 120 mg/dL — ABNORMAL HIGH (ref 70–99)
Potassium: 4.4 mmol/L (ref 3.5–5.1)
SODIUM: 137 mmol/L (ref 135–145)

## 2018-10-03 LAB — CBC
HCT: 32.6 % — ABNORMAL LOW (ref 36.0–46.0)
Hemoglobin: 10.8 g/dL — ABNORMAL LOW (ref 12.0–15.0)
MCH: 34.8 pg — ABNORMAL HIGH (ref 26.0–34.0)
MCHC: 33.1 g/dL (ref 30.0–36.0)
MCV: 105.2 fL — ABNORMAL HIGH (ref 80.0–100.0)
PLATELETS: 204 10*3/uL (ref 150–400)
RBC: 3.1 MIL/uL — ABNORMAL LOW (ref 3.87–5.11)
RDW: 13.8 % (ref 11.5–15.5)
WBC: 7.7 10*3/uL (ref 4.0–10.5)
nRBC: 0 % (ref 0.0–0.2)

## 2018-10-03 NOTE — ED Notes (Signed)
Pt assisted to bathroom with minimal assistance or dizziness reported.

## 2018-10-03 NOTE — ED Provider Notes (Signed)
[facility] Emergency Department Provider Note  ____________________________________________   I have reviewed the triage vital signs and the nursing notes. Where available I have reviewed prior notes and, if possible and indicated, outside hospital notes.    HISTORY  Chief Complaint Dizziness    HPI Joyce [name] is a 73 y.o. female who presents today complaining of feeling lightheaded after she had dialysis yesterday.  She states this is been gone for months.  However, she was sent in today because she still felt a little lightheaded when she stood up too quickly.  She got fluid from EMS and now feels 100% better and would like to go home.  No chest pain or shortness of breath no nausea no vomiting no abdominal pain no diarrhea no dysuria no urinary frequency does still make urine, think she may have a UTI because "I often do when I feel lightheaded".  No fever, no cough, no other symptoms except for sometimes she feels lightheaded when she stands up.  Patient has been on the same medications for some time with no change in her medications.  She sometimes has bradycardia she states from her metoprolol.     Past Medical History:  Diagnosis Date  . Anal fissure   . Bipolar affective disorder ([place])   . CKD (chronic kidney disease)    Dr Holley Raring [name] 4  . Colon polyps   . Diverticulitis   . Diverticulitis   . Family history of adverse reaction to anesthesia    mom - PONV  . GERD (gastroesophageal reflux disease)   . Headache    migraines - none over 10 yrs  . Heart murmur   . History of hiatal hernia   . Hypertension   . Pancreatitis    Valproic acid  . Vertigo     Patient Active Problem List   Diagnosis Date Noted  . Suicide attempt ([place]) 03/09/2018  . Acute respiratory failure with hypoxia ([place]) 03/04/2018  . Bipolar I disorder, current or most recent episode depressed, with psychotic features ([place]) 02/26/2018  . UTI (urinary tract  infection) 12/16/2017  . Palliative care encounter   . Bipolar affective disorder, current episode depressed with psychotic symptoms ([place]) 09/17/2017  . Bipolar I disorder, most recent episode (or current) manic ([place]) 09/02/2017  . Acute delirium 09/02/2017  . Altered mental status   . Acute encephalopathy 09/01/2017  . ESRD on dialysis ([place]) 04/06/2017  . Complication of vascular access for dialysis 04/06/2017  . Hematuria 12/18/2016  . Polycystic kidney 10/12/2016  . Ruptured cyst of kidney 10/03/2016  . HTN (hypertension) 10/01/2016  . Chronic kidney disease 10/01/2016  . Anemia associated with chronic renal failure 09/24/2016  . Acute kidney insufficiency 07/28/2016  . Noninfectious diarrhea   . Benign neoplasm of cecum   . Benign neoplasm of ascending colon   . Diarrhea   . Nausea   . Gastric polyp   . Chronic constipation 12/27/2014  . Nausea with vomiting 12/27/2014  . Left sided abdominal pain 12/27/2014  . Personal history of colonic polyps 05/10/2013    Past Surgical History:  Procedure Laterality Date  . A/V FISTULAGRAM Left 04/14/2017   Procedure: A/V Fistulagram;  Surgeon: Katha Cabal, MD;  Location: [place] [facility];  Service: Cardiovascular;  Laterality: Left;  . A/V FISTULAGRAM Left 06/09/2017   Procedure: A/V FISTULAGRAM;  Surgeon: Katha Cabal, MD;  Location: [place] [facility];  Service: Cardiovascular;  Laterality: Left;  . A/V FISTULAGRAM Left  11/02/2017   Procedure: A/V FISTULAGRAM;  Surgeon: Algernon Huxley, MD;  Location: [place] [facility];  Service: Cardiovascular;  Laterality: Left;  . A/V SHUNT INTERVENTION N/A 11/02/2017   Procedure: A/V SHUNT INTERVENTION;  Surgeon: Algernon Huxley, MD;  Location: [place] [facility];  Service: Cardiovascular;  Laterality: N/A;  . ABDOMINAL HYSTERECTOMY  1990 ?  . AV FISTULA PLACEMENT  4/30  . BREAST EXCISIONAL BIOPSY Left 1994   neg surgical bx  . CHOLECYSTECTOMY  2003  . COLONOSCOPY  2014   Dr.  Jamal Collin  . COLONOSCOPY WITH PROPOFOL N/A 09/24/2015   Procedure: COLONOSCOPY WITH random colon byopies.;  Surgeon: Lucilla Lame, MD;  Location: [place];  Service: Endoscopy;  Laterality: N/A;  . ESOPHAGOGASTRODUODENOSCOPY (EGD) WITH PROPOFOL N/A 09/24/2015   Procedure: ESOPHAGOGASTRODUODENOSCOPY (EGD) ;  Surgeon: Lucilla Lame, MD;  Location: [place];  Service: Endoscopy;  Laterality: N/A;  . EYE SURGERY    . PERIPHERAL VASCULAR CATHETERIZATION N/A 05/29/2015   Procedure: A/V Shuntogram/Fistulagram;  Surgeon: Katha Cabal, MD;  Location: [place] [facility];  Service: Cardiovascular;  Laterality: N/A;  . PERIPHERAL VASCULAR CATHETERIZATION N/A 05/29/2015   Procedure: A/V Shunt Intervention;  Surgeon: Katha Cabal, MD;  Location: [place] [facility];  Service: Cardiovascular;  Laterality: N/A;  . POLYPECTOMY  09/24/2015   Procedure: POLYPECTOMY INTESTINAL;  Surgeon: Lucilla Lame, MD;  Location: [place];  Service: Endoscopy;;  cecal polyp ascending polyp    Prior to Admission medications   Medication Sig Start Date End Date Taking? Authorizing Provider  acetaminophen (TYLENOL) 325 MG tablet Take 325 mg 2 (two) times daily as needed by mouth for moderate pain or headache.     [provider]  brimonidine (ALPHAGAN) 0.2 % ophthalmic solution Place 1 drop into both eyes 2 (two) times daily. 10/22/17   Clapacs, [name], MD  carbamazepine (TEGRETOL) 200 MG tablet Take 1 tablet (200 mg total) by mouth 3 (three) times daily. 03/18/18   [name], Herma Ard B, MD  cholecalciferol (VITAMIN D) 1000 UNITS tablet Take 1,000 Units by mouth daily.    [provider]  clindamycin (CLEOCIN) 300 MG capsule  03/31/18   [provider]  cyanocobalamin (,VITAMIN B-12,) 1000 MCG/ML injection Inject 1,000 mcg into the muscle every 30 (thirty) days.    [provider]  dicyclomine (BENTYL) 20 MG tablet Take 1 tablet (20 mg total) by mouth 3 (three) times  daily before meals. 03/18/18   [name], Herma Ard B, MD  famotidine (PEPCID) 20 MG tablet Take 1 tablet (20 mg total) by mouth daily. 03/18/18   [name], Herma Ard B, MD  lidocaine-prilocaine (EMLA) cream Apply 1 application every Monday, Wednesday, and Friday topically. At dialysis 05/06/17   [provider]  metoprolol tartrate (LOPRESSOR) 25 MG tablet Take 1 tablet (25 mg total) by mouth 2 (two) times daily. Patient not taking: Reported on 04/01/2018 03/18/18   [name], Herma Ard B, MD  midodrine (PROAMATINE) 10 MG tablet Take 10 mg by mouth 3 (three) times daily.    [provider]  multivitamin (RENA-VIT) TABS tablet TAKE 1 TABLET BY MOUTH EVERYDAY AT BEDTIME Patient taking differently: No sig reported 10/27/17   Clapacs, [name], MD  pantoprazole (PROTONIX) 40 MG tablet Take 40 mg by mouth daily.    [provider]  rOPINIRole (REQUIP) 2 MG tablet Take 1 tablet (2 mg total) by mouth daily. 03/18/18   [name], [name], MD  timolol (TIMOPTIC) 0.5 % ophthalmic solution  Place 1 drop into both eyes 2 (two) times daily. 10/22/17   Clapacs, [name], MD  ziprasidone (GEODON) 60 MG capsule Take 1 capsule (60 mg total) by mouth 2 (two) times daily with a meal. 03/18/18   [name], [name], MD    Allergies Morphine and related; Indomethacin; and Pollen extract  Family History  Problem Relation Age of Onset  . Stroke Father   . Hypertension Father   . Breast cancer Other   . Colon cancer Neg Hx   . Liver disease Neg Hx     Social History Social History   Tobacco Use  . Smoking status: Never Smoker  . Smokeless tobacco: Never Used  Substance Use Topics  . Alcohol use: No    Alcohol/week: 0.0 standard drinks  . Drug use: No    Review of Systems Constitutional: No fever/chills Eyes: No visual changes. ENT: No sore throat. No stiff neck no neck pain Cardiovascular: Denies chest pain. Respiratory: Denies shortness of breath. Gastrointestinal:   no vomiting.   No diarrhea.  No constipation. Genitourinary: Negative for dysuria. Musculoskeletal: Negative lower extremity swelling Skin: Negative for rash. Neurological: Negative for severe headaches, focal weakness or numbness.   ____________________________________________   PHYSICAL EXAM:  VITAL SIGNS: ED Triage Vitals  Enc Vitals Group     BP 10/03/18 2102 (!) 122/54     Pulse Rate 10/03/18 2100 (!) 54     Resp 10/03/18 2100 18     Temp 10/03/18 2058 (!) 97.2 F (36.2 C)     Temp Source 10/03/18 2058 Oral     SpO2 10/03/18 2100 99 %     Weight 10/03/18 2059 138 lb (62.6 kg)     Height 10/03/18 2059 5\' 3"  (1.6 m)     Head Circumference --      Peak Flow --      Pain Score 10/03/18 2058 7     Pain Loc --      Pain Edu? --      Excl. in [place]? --     Constitutional: Alert and oriented. Well appearing and in no acute distress. Eyes: Conjunctivae are normal Head: Atraumatic HEENT: No congestion/rhinnorhea. Mucous membranes are moist.  Oropharynx non-erythematous Neck:   Nontender with no meningismus, no masses, no stridor Cardiovascular: Normal rate, regular rhythm. Grossly normal heart sounds.  Good peripheral circulation. Respiratory: Normal respiratory effort.  No retractions. Lungs CTAB. Abdominal: Soft and nontender. No distention. No guarding no rebound Back:  There is no focal tenderness or step off.  there is no midline tenderness there are no lesions noted. there is no CVA tenderness Musculoskeletal: No lower extremity tenderness, no upper extremity tenderness. No joint effusions, no DVT signs strong distal pulses no edema Neurologic:  Normal speech and language. No gross focal neurologic deficits are appreciated.  Skin:  Skin is warm, dry and intact. No rash noted. Psychiatric: Mood and affect are normal. Speech and behavior are normal.  ____________________________________________   LABS (all labs ordered are listed, but only abnormal results are displayed)  Labs  Reviewed  BASIC METABOLIC PANEL - Abnormal; Notable for the following components:      Result Value   Glucose, Bld 120 (*)    BUN 31 (*)    Creatinine, Ser 5.05 (*)    Calcium 8.6 (*)    GFR calc non Af Amer 8 (*)    GFR calc Af Amer 9 (*)    All other components within normal limits  CBC -  Abnormal; Notable for the following components:   RBC 3.10 (*)    Hemoglobin 10.8 (*)    HCT 32.6 (*)    MCV 105.2 (*)    MCH 34.8 (*)    All other components within normal limits  URINALYSIS, COMPLETE (UACMP) WITH MICROSCOPIC - Abnormal; Notable for the following components:   Color, Urine YELLOW (*)    APPearance HAZY (*)    Leukocytes,Ua LARGE (*)    WBC, UA >50 (*)    All other components within normal limits  CBG MONITORING, ED    Pertinent labs  results that were available during my care of the patient were reviewed by me and considered in my medical decision making (see chart for details). ____________________________________________  EKG  I personally interpreted any EKGs ordered by me or triage Sinus rhythm, bradycardia noted, rate 52 no acute ST elevation or depression. ____________________________________________  RADIOLOGY  Pertinent labs & imaging results that were available during my care of the patient were reviewed by me and considered in my medical decision making (see chart for details). If possible, patient and/or family made aware of any abnormal findings.  No results found. ____________________________________________    PROCEDURES  Procedure(s) performed: None  Procedures  Critical Care performed: None  ____________________________________________   INITIAL IMPRESSION / ASSESSMENT AND PLAN / ED COURSE  Pertinent labs & imaging results that were available during my care of the patient were reviewed by me and considered in my medical decision making (see chart for details).  Patient is feeling lightheaded at home she is not orthostatic here, she may  have a urinary tract infection, urine actionable, we are sending a urine culture.  No Evidence of urosepsis.  We will give her antibiotics however for this.  Last urine culture showed ceftriaxone sensitive E. coli which we will give her.  I will give her a shot of Rocephin prior to discharge.  Return precautions follow-up given and understood.  I will advise her to hold her metoprolol given her bradycardia and her lightheadedness until she can talk to her doctor.    ____________________________________________   FINAL CLINICAL IMPRESSION(S) / ED DIAGNOSES  Final diagnoses:  None      This chart was dictated using voice recognition software.  Despite best efforts to proofread,  errors can occur which can change meaning.   3    Schuyler Amor, MD 10/04/18 [phone]

## 2018-10-03 NOTE — ED Triage Notes (Addendum)
Pt comes via EMS from [facility] after pt feels that her BP is dropping after dialysis yesterday. She had full dialysis without complications. Pt was normotensive with EMS at 120/49. Pt reports dizziness with walking. Pt was 49 HR with EMS. CBG with EMS was 166.

## 2018-10-04 DIAGNOSIS — R42 Dizziness and giddiness: Secondary | ICD-10-CM | POA: Diagnosis not present

## 2018-10-04 MED ORDER — CEPHALEXIN 500 MG PO CAPS: 500.0000 mg | ORAL_CAPSULE | Freq: Three times a day (TID) | ORAL | 0 refills | Status: AC

## 2018-10-04 MED ORDER — SODIUM CHLORIDE 0.9 % IV SOLN
1.0000 g | Freq: Once | INTRAVENOUS | Status: AC
  Administered 2018-10-04: 1 g via INTRAVENOUS
  Filled 2018-10-04: qty 10

## 2018-10-04 NOTE — Discharge Instructions (Addendum)
Return to the emergency room for any new or worrisome symptoms, I would advise not taking her metoprolol for a day or 2 until you can talk to your doctor about your low heart rate and your lightheadedness.  I would talk to your kidney doctors about how you feel lightheaded after your dialysis there may be something they can do to help you with that.  You do have a urinary tract infection, he have high fever or other concerns including weakness return to the emergency room.  We were giving you antibiotics here in the IV, and then, please take the antibiotics until they are gone.

## 2018-10-06 LAB — URINE CULTURE

## 2018-10-07 ENCOUNTER — Encounter (INDEPENDENT_AMBULATORY_CARE_PROVIDER_SITE_OTHER): Payer: Medicare HMO

## 2018-10-07 ENCOUNTER — Ambulatory Visit (INDEPENDENT_AMBULATORY_CARE_PROVIDER_SITE_OTHER): Payer: Medicare HMO | Admitting: Vascular Surgery

## 2018-10-18 ENCOUNTER — Encounter (INDEPENDENT_AMBULATORY_CARE_PROVIDER_SITE_OTHER): Payer: Medicare HMO

## 2018-10-18 ENCOUNTER — Ambulatory Visit (INDEPENDENT_AMBULATORY_CARE_PROVIDER_SITE_OTHER): Payer: Medicare HMO | Admitting: Vascular Surgery

## 2018-12-21 ENCOUNTER — Other Ambulatory Visit (INDEPENDENT_AMBULATORY_CARE_PROVIDER_SITE_OTHER): Payer: Self-pay | Admitting: Nurse Practitioner

## 2018-12-21 MED ORDER — CLINDAMYCIN PHOSPHATE 300 MG/50ML IV SOLN
300.0000 mg | Freq: Once | INTRAVENOUS | Status: AC
  Administered 2018-12-22: 300 mg via INTRAVENOUS

## 2018-12-22 ENCOUNTER — Ambulatory Visit
Admission: RE | Admit: 2018-12-22 | Discharge: 2018-12-22 | Disposition: A | Discharge status: Home / Self Care | Payer: Medicare HMO | Attending: Vascular Surgery | Admitting: Vascular Surgery

## 2018-12-22 ENCOUNTER — Other Ambulatory Visit
Admission: RE | Admit: 2018-12-22 | Discharge: 2018-12-22 | Disposition: A | Discharge status: Home / Self Care | Payer: Medicare HMO | Source: Ambulatory Visit | Attending: Vascular Surgery | Admitting: Vascular Surgery

## 2018-12-22 ENCOUNTER — Encounter
Admission: RE | Disposition: A | Discharge status: Home / Self Care | Payer: Self-pay | Source: Home / Self Care | Attending: Vascular Surgery

## 2018-12-22 ENCOUNTER — Other Ambulatory Visit: Payer: Self-pay

## 2018-12-22 DIAGNOSIS — K219 Gastro-esophageal reflux disease without esophagitis: Secondary | ICD-10-CM | POA: Insufficient documentation

## 2018-12-22 DIAGNOSIS — F319 Bipolar disorder, unspecified: Secondary | ICD-10-CM | POA: Insufficient documentation

## 2018-12-22 DIAGNOSIS — Z992 Dependence on renal dialysis: Secondary | ICD-10-CM | POA: Diagnosis not present

## 2018-12-22 DIAGNOSIS — T82858A Stenosis of vascular prosthetic devices, implants and grafts, initial encounter: Secondary | ICD-10-CM | POA: Diagnosis not present

## 2018-12-22 DIAGNOSIS — Y841 Kidney dialysis as the cause of abnormal reaction of the patient, or of later complication, without mention of misadventure at the time of the procedure: Secondary | ICD-10-CM | POA: Diagnosis not present

## 2018-12-22 DIAGNOSIS — K449 Diaphragmatic hernia without obstruction or gangrene: Secondary | ICD-10-CM | POA: Insufficient documentation

## 2018-12-22 DIAGNOSIS — I120 Hypertensive chronic kidney disease with stage 5 chronic kidney disease or end stage renal disease: Secondary | ICD-10-CM | POA: Insufficient documentation

## 2018-12-22 DIAGNOSIS — Z1159 Encounter for screening for other viral diseases: Secondary | ICD-10-CM | POA: Insufficient documentation

## 2018-12-22 DIAGNOSIS — T82868A Thrombosis of vascular prosthetic devices, implants and grafts, initial encounter: Secondary | ICD-10-CM | POA: Insufficient documentation

## 2018-12-22 DIAGNOSIS — N186 End stage renal disease: Secondary | ICD-10-CM | POA: Insufficient documentation

## 2018-12-22 DIAGNOSIS — E1122 Type 2 diabetes mellitus with diabetic chronic kidney disease: Secondary | ICD-10-CM | POA: Diagnosis not present

## 2018-12-22 DIAGNOSIS — Z8249 Family history of ischemic heart disease and other diseases of the circulatory system: Secondary | ICD-10-CM | POA: Diagnosis not present

## 2018-12-22 DIAGNOSIS — Z885 Allergy status to narcotic agent status: Secondary | ICD-10-CM | POA: Insufficient documentation

## 2018-12-22 HISTORY — PX: A/V FISTULAGRAM: CATH118298

## 2018-12-22 LAB — POTASSIUM (ARMC VASCULAR LAB ONLY): Potassium (ARMC vascular lab): 4.8 (ref 3.5–5.1)

## 2018-12-22 LAB — SARS CORONAVIRUS 2 BY RT PCR (HOSPITAL ORDER, PERFORMED IN ~~LOC~~ HOSPITAL LAB): SARS Coronavirus 2: NEGATIVE

## 2018-12-22 SURGERY — A/V FISTULAGRAM
Anesthesia: Moderate Sedation | Laterality: Left

## 2018-12-22 MED ORDER — MIDAZOLAM HCL 5 MG/5ML IJ SOLN
INTRAMUSCULAR | Status: AC
  Filled 2018-12-22: qty 5

## 2018-12-22 MED ORDER — CLINDAMYCIN PHOSPHATE 300 MG/50ML IV SOLN
INTRAVENOUS | Status: AC
  Administered 2018-12-22: 300 mg via INTRAVENOUS
  Filled 2018-12-22: qty 50

## 2018-12-22 MED ORDER — HEPARIN SODIUM (PORCINE) 1000 UNIT/ML IJ SOLN
INTRAMUSCULAR | Status: AC
  Filled 2018-12-22: qty 1

## 2018-12-22 MED ORDER — SODIUM CHLORIDE 0.9 % IV SOLN
INTRAVENOUS | Status: DC
  Administered 2018-12-22: 14:00:00 via INTRAVENOUS

## 2018-12-22 MED ORDER — METHYLPREDNISOLONE SODIUM SUCC 125 MG IJ SOLR: 125.0000 mg | Freq: Once | INTRAMUSCULAR | Status: DC | PRN

## 2018-12-22 MED ORDER — HEPARIN SODIUM (PORCINE) 1000 UNIT/ML IJ SOLN
INTRAMUSCULAR | Status: DC | PRN
  Administered 2018-12-22: 3000 [IU] via INTRAVENOUS

## 2018-12-22 MED ORDER — MIDAZOLAM HCL 2 MG/ML PO SYRP: 8.0000 mg | ORAL_SOLUTION | Freq: Once | ORAL | Status: DC | PRN

## 2018-12-22 MED ORDER — DIPHENHYDRAMINE HCL 50 MG/ML IJ SOLN: 50.0000 mg | Freq: Once | INTRAMUSCULAR | Status: DC | PRN

## 2018-12-22 MED ORDER — LIDOCAINE HCL (PF) 1 % IJ SOLN
INTRAMUSCULAR | Status: AC
  Filled 2018-12-22: qty 30

## 2018-12-22 MED ORDER — FENTANYL CITRATE (PF) 100 MCG/2ML IJ SOLN
INTRAMUSCULAR | Status: DC | PRN
  Administered 2018-12-22: 50 ug via INTRAVENOUS

## 2018-12-22 MED ORDER — MIDAZOLAM HCL 2 MG/2ML IJ SOLN
INTRAMUSCULAR | Status: DC | PRN
  Administered 2018-12-22: 1 mg via INTRAVENOUS

## 2018-12-22 MED ORDER — ONDANSETRON HCL 4 MG/2ML IJ SOLN: 4.0000 mg | Freq: Four times a day (QID) | INTRAMUSCULAR | Status: DC | PRN

## 2018-12-22 MED ORDER — HYDROMORPHONE HCL 1 MG/ML IJ SOLN: 1.0000 mg | Freq: Once | INTRAMUSCULAR | Status: DC | PRN

## 2018-12-22 MED ORDER — FAMOTIDINE 20 MG PO TABS: 40.0000 mg | ORAL_TABLET | Freq: Once | ORAL | Status: DC | PRN

## 2018-12-22 MED ORDER — FENTANYL CITRATE (PF) 100 MCG/2ML IJ SOLN
INTRAMUSCULAR | Status: AC
  Filled 2018-12-22: qty 2

## 2018-12-22 SURGICAL SUPPLY — 15 items
BALLN DORADO 6X20X80 (BALLOONS) ×2
BALLN DORADO 8X40X80 (BALLOONS) ×2
BALLOON DORADO 6X20X80 (BALLOONS) ×1 IMPLANT
BALLOON DORADO 8X40X80 (BALLOONS) ×1 IMPLANT
DEVICE PRESTO INFLATION (MISCELLANEOUS) ×2 IMPLANT
DRAPE BRACHIAL (DRAPES) ×2 IMPLANT
NEEDLE ENTRY 21GA 7CM ECHOTIP (NEEDLE) ×2 IMPLANT
PACK ANGIOGRAPHY (CUSTOM PROCEDURE TRAY) ×2 IMPLANT
SET INTRO CAPELLA COAXIAL (SET/KITS/TRAYS/PACK) ×2 IMPLANT
SHEATH BRITE TIP 6FRX5.5 (SHEATH) ×2 IMPLANT
SHEATH BRITE TIP 7FRX11 (SHEATH) ×2 IMPLANT
STENT VIABAHN 8X2.5X120 (Permanent Stent) ×1 IMPLANT
STENT VIABAHN 8X25X120 (Permanent Stent) ×1 IMPLANT
SUT MNCRL AB 4-0 PS2 18 (SUTURE) ×2 IMPLANT
WIRE G 018X200 V18 (WIRE) ×2 IMPLANT

## 2018-12-22 NOTE — Op Note (Signed)
OPERATIVE NOTE   PROCEDURE: 1. Contrast injection left brachiocephalic AV access 2. Percutaneous transluminal angioplasty peripheral segment to 8 mm 3. Percutaneous transluminal angioplasty and stent placement central venous segment to 8 mm  PRE-OPERATIVE DIAGNOSIS: Complication of dialysis access                                                       End Stage Renal Disease  POST-OPERATIVE DIAGNOSIS: same as above   SURGEON: Katha Cabal, M.D.  ANESTHESIA: Conscious sedation was administered under my direct supervision by the interventional radiology RN. IV Versed plus fentanyl were utilized. Continuous ECG, pulse oximetry and blood pressure was monitored throughout the entire procedure.  Conscious sedation was for a total of 30 minutes.  ESTIMATED BLOOD LOSS: minimal  FINDING(S): Stricture of the AV graft within the peripheral segment as well as a stricture within the central venous portion  SPECIMEN(S):  None  CONTRAST: 35 cc  FLUOROSCOPY TIME: 2.9 minutes  INDICATIONS: Joyce [name] is a 73 y.o. female who  presents with malfunctioning left arm AV access.  The patient is scheduled for angiography with possible intervention of the AV access.  The patient is aware the risks include but are not limited to: bleeding, infection, thrombosis of the cannulated access, and possible anaphylactic reaction to the contrast.  The patient acknowledges if the access can not be salvaged a tunneled catheter will be needed and will be placed during this procedure.  The patient is aware of the risks of the procedure and elects to proceed with the angiogram and intervention.  DESCRIPTION: After full informed written consent was obtained, the patient was brought back to the Special Procedure suite and placed supine position.  Appropriate cardiopulmonary monitors were placed.  The left arm was prepped and draped in the standard fashion.  Appropriate timeout is called. The left brachiocephalic  was cannulated with a micropuncture needle.  Cannulation was performed with ultrasound guidance. Ultrasound was placed in a sterile sleeve, the AV access was interrogated and noted to be echolucent and compressible indicating patency. Image was recorded for the permanent record. The puncture is performed under continuous ultrasound visualization.   The microwire was advanced and the needle was exchanged for  a microsheath.  The J-wire was then advanced and a 6 Fr sheath inserted.  Hand injections were completed to image the access from the arterial anastomosis through the entire access.  The central venous structures were also imaged by hand injections.  The fistula itself is aneurysmal in the antecubital area.  There are several via bond stents previously placed extending up the arm to the confluence of the cephalic and subclavian vein.  At both the leading edge of the stent there is a greater than 90% stenosis and at the confluence with the subclavian there is a greater than 90% stenosis.  The more proximal subclavian innominate and superior vena cava are widely patent.  Based on the images,  3000 units of heparin was given and a wire was negotiated through the strictures within the venous portion of the graft as well as the central stenosis. The sheath was then upsized to a 7 [place] sheath.  An 6 mm x 20 mm Dorado balloon was used to predilate both the central lesion at the subclavian vein as well as the peripheral lesion.  Inflation  was to 16 atm.  The detector was then repositioned over the central portion of the AV access magnified imaging of the confluence of the subclavian and cephalic veins was performed.  Patient previously had stents placed up to the subclavian and given this recurrent lesion the stent being placed today will need to extend into the subclavian.  The via bond 8 mm x 25 mm stent is positioned so that it crosses confluence and extends into the subclavian.  It is deployed without  difficulty and an 8 mm x 40 mm Dorado balloon was used to dilate the stent. Inflation was to 36 atm for 1 minutes.  Attention was then turned to the peripheral lesion which was dilated with the [facility] balloon to approximately 30 atm for 1 minute.  Follow-up imaging demonstrated less than 5% residual stenosis at both locations.  Follow-up imaging demonstrates significant improvement with a marked reduction of the strictures.  There is now rapid flow of contrast through the graft and the central veins.   A 4-0 Monocryl purse-string suture was sewn around the sheath.  The sheath was removed and light pressure was applied.  A sterile bandage was applied to the puncture site.  Summary: Successful treatment of the left arm brachiocephalic fistula with angioplasty and stent placement in the central vein and angioplasty alone in the peripheral segment.  COMPLICATIONS: None  CONDITION: Joyce [name], M.D [facility] Office: [phone]  12/22/2018 5:20 PM

## 2018-12-22 NOTE — Discharge Instructions (Signed)
Moderate Conscious Sedation, Adult, Care After  These instructions provide you with information about caring for yourself after your procedure. Your health care provider may also give you more specific instructions. Your treatment has been planned according to current medical practices, but problems sometimes occur. Call your health care provider if you have any problems or questions after your procedure.  What can I expect after the procedure?  After your procedure, it is common:  · To feel sleepy for several hours.  · To feel clumsy and have poor balance for several hours.  · To have poor judgment for several hours.  · To vomit if you eat too soon.  Follow these instructions at home:  For at least 24 hours after the procedure:    · Do not:  ? Participate in activities where you could fall or become injured.  ? Drive.  ? Use heavy machinery.  ? Drink alcohol.  ? Take sleeping pills or medicines that cause drowsiness.  ? Make important decisions or sign legal documents.  ? Take care of children on your own.  · Rest.  Eating and drinking  · Follow the diet recommended by your health care provider.  · If you vomit:  ? Drink water, juice, or soup when you can drink without vomiting.  ? Make sure you have little or no nausea before eating solid foods.  General instructions  · Have a responsible adult stay with you until you are awake and alert.  · Take over-the-counter and prescription medicines only as told by your health care provider.  · If you smoke, do not smoke without supervision.  · Keep all follow-up visits as told by your health care provider. This is important.  Contact a health care provider if:  · You keep feeling nauseous or you keep vomiting.  · You feel light-headed.  · You develop a rash.  · You have a fever.  Get help right away if:  · You have trouble breathing.  This information is not intended to replace advice given to you by your health care provider. Make sure you discuss any questions you have  with your health care provider.  Document Released: 04/27/2013 Document Revised: 12/10/2015 Document Reviewed: 10/27/2015  Elsevier Interactive Patient Education © 2019 [employer]  Dialysis Fistulogram, Care After  This sheet gives you information about how to care for yourself after your procedure. Your health care provider may also give you more specific instructions. If you have problems or questions, contact your health care provider.  What can I expect after the procedure?  After the procedure, it is common to have:  · A small amount of discomfort in the area where the small, thin tube (catheter) was placed for the procedure.  · A small amount of bruising around the fistula.  · Sleepiness and tiredness (fatigue).  Follow these instructions at home:  Activity    · Rest at home and do not lift anything that is heavier than 5 lb (2.3 kg) on the day after your procedure.  · Return to your normal activities as told by your health care provider. Ask your health care provider what activities are safe for you.  · Do not drive or use heavy machinery while taking prescription pain medicine.  · Do not drive for 24 hours if you were given a medicine to help you relax (sedative) during your procedure.  Medicines    · Take over-the-counter and prescription medicines only as told by your health care   provider.  Puncture site care  · Follow instructions from your health care provider about how to take care of the site where catheters were inserted. Make sure you:  ? Wash your hands with soap and water before you change your bandage (dressing). If soap and water are not available, use hand sanitizer.  ? Change your dressing as told by your health care provider.  ? Leave stitches (sutures), skin glue, or adhesive strips in place. These skin closures may need to stay in place for 2 weeks or longer. If adhesive strip edges start to loosen and curl up, you may trim the loose edges. Do not remove adhesive strips completely unless  your health care provider tells you to do that.  · Check your puncture area every day for signs of infection. Check for:  ? Redness, swelling, or pain.  ? Fluid or blood.  ? Warmth.  ? Pus or a bad smell.  General instructions  · Do not take baths, swim, or use a hot tub until your health care provider approves. Ask your health care provider if you may take showers. You may only be allowed to take sponge baths.  · Monitor your dialysis fistula closely. Check to make sure that you can feel a vibration or buzz (a thrill) when you put your fingers over the fistula.  · Prevent damage to your graft or fistula:  ? Do not wear tight-fitting clothing or jewelry on the arm or leg that has your graft or fistula.  ? Tell all your health care providers that you have a dialysis fistula or graft.  ? Do not allow blood draws, IVs, or blood pressure readings to be done in the arm that has your fistula or graft.  ? Do not allow flu shots or vaccinations in the arm with your fistula or graft.  · Keep all follow-up visits as told by your health care provider. This is important.  Contact a health care provider if:  · You have redness, swelling, or pain at the site where the catheter was put in.  · You have fluid or blood coming from the catheter site.  · The catheter site feels warm to the touch.  · You have pus or a bad smell coming from the catheter site.  · You have a fever or chills.  Get help right away if:  · You feel weak.  · You have trouble balancing.  · You have trouble moving your arms or legs.  · You have problems with your speech or vision.  · You can no longer feel a vibration or buzz when you put your fingers over your dialysis fistula.  · The limb that was used for the procedure:  ? Swells.  ? Is painful.  ? Is cold.  ? Is discolored, such as blue or pale white.  · You have chest pain or shortness of breath.  Summary  · After a dialysis fistulogram, it is common to have a small amount of discomfort or bruising in the  area where the small, thin tube (catheter) was placed.  · Rest at home on the day after your procedure. Return to your normal activities as told by your health care provider.  · Take over-the-counter and prescription medicines only as told by your health care provider.  · Follow instructions from your health care provider about how to take care of the site where the catheter was inserted.  · Keep all follow-up visits as told by your health   care provider.  This information is not intended to replace advice given to you by your health care provider. Make sure you discuss any questions you have with your health care provider.  Document Released: 11/21/2013 Document Revised: 08/07/2017 Document Reviewed: 08/07/2017  Elsevier Interactive Patient Education © 2019 Elsevier Inc.

## 2018-12-22 NOTE — H&P (Signed)
[facility] Admission History & Physical  MRN : [id number]  Joyce [name] is a 73 y.o. ([birth year]) female who presents with chief complaint of No chief complaint on file. Marland Kitchen  History of Present Illness: I am asked to evaluate the patient by the dialysis center. The patient was sent here because they were unable to achieve adequate dialysis this morning. Furthermore the Center states there is very poor thrill and bruit. The patient states there there have been increasing problems with the access, such as "pulling clots" during dialysis and prolonged bleeding after decannulation. The patient estimates these problems have been going on for several weeks. The patient is unaware of any other change.  Patient denies pain or tenderness overlying the access.  There is no pain with dialysis.  The patient denies hand pain or finger pain consistent with steal syndrome.   There have been past interventions or declots of this access.  The patient is not chronically hypotensive on dialysis.  Current Facility-Administered Medications  Medication Dose Route Frequency Provider Last Rate Last Dose  . lidocaine (PF) (XYLOCAINE) 1 % injection           . 0.9 %  sodium chloride infusion   Intravenous Continuous Kris Hartmann, NP 10 mL/hr at 12/22/18 1400    . clindamycin (CLEOCIN) IVPB 300 mg  300 mg Intravenous Once Kris Hartmann, NP      . diphenhydrAMINE (BENADRYL) injection 50 mg  50 mg Intravenous Once PRN Kris Hartmann, NP      . famotidine (PEPCID) tablet 40 mg  40 mg Oral Once PRN Kris Hartmann, NP      . HYDROmorphone (DILAUDID) injection 1 mg  1 mg Intravenous Once PRN Eulogio Ditch E, NP      . methylPREDNISolone sodium succinate (SOLU-MEDROL) 125 mg/2 mL injection 125 mg  125 mg Intravenous Once PRN Eulogio Ditch E, NP      . midazolam (VERSED) 2 MG/ML syrup 8 mg  8 mg Oral Once PRN Kris Hartmann, NP      . ondansetron (ZOFRAN) injection 4 mg  4 mg Intravenous  Q6H PRN Kris Hartmann, NP        Past Medical History:  Diagnosis Date  . Anal fissure   . Bipolar affective disorder ([place])   . CKD (chronic kidney disease)    Dr Holley Raring [name] 4  . Colon polyps   . Diverticulitis   . Diverticulitis   . Family history of adverse reaction to anesthesia    mom - PONV  . GERD (gastroesophageal reflux disease)   . Headache    migraines - none over 10 yrs  . Heart murmur   . History of hiatal hernia   . Hypertension   . Pancreatitis    Valproic acid  . Vertigo     Past Surgical History:  Procedure Laterality Date  . A/V FISTULAGRAM Left 04/14/2017   Procedure: A/V Fistulagram;  Surgeon: Katha Cabal, MD;  Location: [facility];  Service: Cardiovascular;  Laterality: Left;  . A/V FISTULAGRAM Left 06/09/2017   Procedure: A/V FISTULAGRAM;  Surgeon: Katha Cabal, MD;  Location: [place] [facility];  Service: Cardiovascular;  Laterality: Left;  . A/V FISTULAGRAM Left 11/02/2017   Procedure: A/V FISTULAGRAM;  Surgeon: Algernon Huxley, MD;  Location: [place] [facility];  Service: Cardiovascular;  Laterality: Left;  . A/V SHUNT INTERVENTION N/A 11/02/2017   Procedure: A/V SHUNT INTERVENTION;  Surgeon: Lucky Cowboy,  Erskine Squibb, MD;  Location: [place] [facility];  Service: Cardiovascular;  Laterality: N/A;  . ABDOMINAL HYSTERECTOMY  1990 ?  . AV FISTULA PLACEMENT  4/30  . BREAST EXCISIONAL BIOPSY Left 1994   neg surgical bx  . CHOLECYSTECTOMY  2003  . COLONOSCOPY  2014   Dr. Jamal Collin  . COLONOSCOPY WITH PROPOFOL N/A 09/24/2015   Procedure: COLONOSCOPY WITH random colon byopies.;  Surgeon: Lucilla Lame, MD;  Location: [place];  Service: Endoscopy;  Laterality: N/A;  . ESOPHAGOGASTRODUODENOSCOPY (EGD) WITH PROPOFOL N/A 09/24/2015   Procedure: ESOPHAGOGASTRODUODENOSCOPY (EGD) ;  Surgeon: Lucilla Lame, MD;  Location: [place];  Service: Endoscopy;  Laterality: N/A;  . EYE SURGERY    . PERIPHERAL VASCULAR CATHETERIZATION N/A  05/29/2015   Procedure: A/V Shuntogram/Fistulagram;  Surgeon: Katha Cabal, MD;  Location: [place] [facility];  Service: Cardiovascular;  Laterality: N/A;  . PERIPHERAL VASCULAR CATHETERIZATION N/A 05/29/2015   Procedure: A/V Shunt Intervention;  Surgeon: Katha Cabal, MD;  Location: [place] [facility];  Service: Cardiovascular;  Laterality: N/A;  . POLYPECTOMY  09/24/2015   Procedure: POLYPECTOMY INTESTINAL;  Surgeon: Lucilla Lame, MD;  Location: [place];  Service: Endoscopy;;  cecal polyp ascending polyp    Social History Social History   Tobacco Use  . Smoking status: Never Smoker  . Smokeless tobacco: Never Used  Substance Use Topics  . Alcohol use: No    Alcohol/week: 0.0 standard drinks  . Drug use: No    Family History Family History  Problem Relation Age of Onset  . Stroke Father   . Hypertension Father   . Breast cancer Other   . Colon cancer Neg Hx   . Liver disease Neg Hx     No family history of bleeding or clotting disorders, autoimmune disease or porphyria  Allergies  Allergen Reactions  . Morphine And Related Shortness Of Breath    Pt reports chest pain and difficulty breathing.  . Indomethacin Hives  . Pollen Extract Other (See Comments)    Sinus problems and HA     REVIEW OF SYSTEMS (Negative unless checked)  Constitutional: [] Weight loss  [] Fever  [] Chills Cardiac: [] Chest pain   [] Chest pressure   [] Palpitations   [] Shortness of breath when laying flat   [] Shortness of breath at rest   [x] Shortness of breath with exertion. Vascular:  [] Pain in legs with walking   [] Pain in legs at rest   [] Pain in legs when laying flat   [] Claudication   [] Pain in feet when walking  [] Pain in feet at rest  [] Pain in feet when laying flat   [] History of DVT   [] Phlebitis   [] Swelling in legs   [] Varicose veins   [] Non-healing ulcers Pulmonary:   [] Uses home oxygen   [] Productive cough   [] Hemoptysis   [] Wheeze  [] COPD   [] Asthma Neurologic:   [] Dizziness  [] Blackouts   [] Seizures   [] History of stroke   [] History of TIA  [] Aphasia   [] Temporary blindness   [] Dysphagia   [] Weakness or numbness in arms   [] Weakness or numbness in legs Musculoskeletal:  [] Arthritis   [] Joint swelling   [] Joint pain   [] Low back pain Hematologic:  [] Easy bruising  [] Easy bleeding   [] Hypercoagulable state   [] Anemic  [] Hepatitis Gastrointestinal:  [] Blood in stool   [] Vomiting blood  [] Gastroesophageal reflux/heartburn   [] Difficulty swallowing. Genitourinary:  [x] Chronic kidney disease   [] Difficult urination  [] Frequent urination  [] Burning with urination   []   Blood in urine Skin:  [] Rashes   [] Ulcers   [] Wounds Psychological:  [] History of anxiety   []  History of major depression.  Physical Examination  Vitals:   12/22/18 1335  BP: (!) 139/53  Pulse: (!) 59  Temp: 98.1 F (36.7 C)  TempSrc: Oral  Weight: 69.8 kg  Height: 5\' 3"  (1.6 m)   Body mass index is 27.24 kg/m. Gen: WD/WN, NAD Head: [place]/AT, No temporalis wasting. Prominent temp pulse not noted. Ear/Nose/Throat: Hearing grossly intact, nares w/o erythema or drainage, oropharynx w/o Erythema/Exudate,  Eyes: Conjunctiva clear, sclera non-icteric Neck: Trachea midline.  No JVD.  Pulmonary:  Good air movement, respirations not labored, no use of accessory muscles.  Cardiac: RRR, normal S1, S2. Vascular: left brachial cephalic fistula markedly pulsatile Vessel Right Left  Radial Palpable Palpable  Ulnar Not Palpable Not Palpable  Brachial Palpable Palpable  Carotid Palpable, without bruit Palpable, without bruit  Gastrointestinal: soft, non-tender/non-distended. No guarding/reflex.  Musculoskeletal: M/S 5/5 throughout.  Extremities without ischemic changes.  No deformity or atrophy.  Neurologic: Sensation grossly intact in extremities.  Symmetrical.  Speech is fluent. Motor exam as listed above. Psychiatric: Judgment intact, Mood & affect appropriate for pt's clinical  situation. Dermatologic: No rashes or ulcers noted.  No cellulitis or open wounds. Lymph : No Cervical, Axillary, or Inguinal lymphadenopathy.   CBC Lab Results  Component Value Date   WBC 7.7 10/03/2018   HGB 10.8 (L) 10/03/2018   HCT 32.6 (L) 10/03/2018   MCV 105.2 (H) 10/03/2018   PLT 204 10/03/2018    BMET    Component Value Date/Time   NA 137 10/03/2018 2111   NA 139 11/09/2014 1027   K 4.4 10/03/2018 2111   K 4.7 11/09/2014 1027   CL 100 10/03/2018 2111   CL 108 11/09/2014 1027   CO2 26 10/03/2018 2111   CO2 25 11/09/2014 1027   GLUCOSE 120 (H) 10/03/2018 2111   GLUCOSE 100 (H) 11/09/2014 1027   BUN 31 (H) 10/03/2018 2111   BUN 51 (H) 11/09/2014 1027   CREATININE 5.05 (H) 10/03/2018 2111   CREATININE 2.98 (H) 11/09/2014 1027   CALCIUM 8.6 (L) 10/03/2018 2111   CALCIUM 8.9 11/09/2014 1027   GFRNONAA 8 (L) 10/03/2018 2111   GFRNONAA 15 (L) 11/09/2014 1027   GFRAA 9 (L) 10/03/2018 2111   GFRAA 18 (L) 11/09/2014 1027   CrCl cannot be calculated (Patient's most recent lab result is older than the maximum 21 days allowed.).  COAG Lab Results  Component Value Date   INR 1.08 10/03/2016   INR 1.0 11/09/2014    Radiology No results found.  Assessment/Plan 1.  Complication dialysis device with thrombosis AV access:  Patient's left arm fistula is malfunctioning. The patient will undergo angiography and correction of any problems using interventional techniques with the hope of restoring function to the access.  The risks and benefits were described to the patient.  All questions were answered.  The patient agrees to proceed with angiography and intervention. Potassium will be drawn to ensure that it is an appropriate level prior to performing intervention. 2.  End-stage renal disease requiring hemodialysis:  Patient will continue dialysis therapy without further interruption if a successful intervention is not achieved then a tunneled catheter will be placed. Dialysis  has already been arranged. 3.  Hypertension:  Patient will continue medical management; nephrology is following no changes in oral medications. 4. Diabetes mellitus:  Glucose will be monitored and oral medications been held this morning once  the patient has undergone the patient's procedure po intake will be reinitiated and again Accu-Cheks will be used to assess the blood glucose level and treat as needed. The patient will be restarted on the patient's usual hypoglycemic regime 5.  GERD:  Continue PPI as already ordered, this medication has been reviewed and there are no changes at this time.  Avoidence of caffeine and alcohol  Moderate elevation of the head of the bed    [name], MD  12/22/2018 2:46 PM

## 2018-12-23 ENCOUNTER — Encounter: Payer: Self-pay | Admitting: Vascular Surgery

## 2019-01-11 ENCOUNTER — Other Ambulatory Visit (INDEPENDENT_AMBULATORY_CARE_PROVIDER_SITE_OTHER): Payer: Self-pay | Admitting: Vascular Surgery

## 2019-01-11 DIAGNOSIS — Z95820 Peripheral vascular angioplasty status with implants and grafts: Secondary | ICD-10-CM

## 2019-01-11 DIAGNOSIS — N186 End stage renal disease: Secondary | ICD-10-CM

## 2019-01-14 ENCOUNTER — Encounter (INDEPENDENT_AMBULATORY_CARE_PROVIDER_SITE_OTHER): Payer: Self-pay | Admitting: Nurse Practitioner

## 2019-01-14 ENCOUNTER — Ambulatory Visit (INDEPENDENT_AMBULATORY_CARE_PROVIDER_SITE_OTHER): Payer: Medicare HMO

## 2019-01-14 ENCOUNTER — Other Ambulatory Visit: Payer: Self-pay

## 2019-01-14 ENCOUNTER — Ambulatory Visit (INDEPENDENT_AMBULATORY_CARE_PROVIDER_SITE_OTHER): Payer: Medicare HMO | Admitting: Nurse Practitioner

## 2019-01-14 VITALS — BP 139/70 | HR 61 | Resp 16 | Wt 155.0 lb

## 2019-01-14 DIAGNOSIS — N186 End stage renal disease: Secondary | ICD-10-CM

## 2019-01-14 DIAGNOSIS — I159 Secondary hypertension, unspecified: Secondary | ICD-10-CM | POA: Diagnosis not present

## 2019-01-14 DIAGNOSIS — Z95820 Peripheral vascular angioplasty status with implants and grafts: Secondary | ICD-10-CM

## 2019-01-14 DIAGNOSIS — Z992 Dependence on renal dialysis: Secondary | ICD-10-CM

## 2019-01-14 DIAGNOSIS — Z79899 Other long term (current) drug therapy: Secondary | ICD-10-CM

## 2019-01-14 DIAGNOSIS — K219 Gastro-esophageal reflux disease without esophagitis: Secondary | ICD-10-CM

## 2019-01-14 DIAGNOSIS — Z9889 Other specified postprocedural states: Secondary | ICD-10-CM

## 2019-01-14 NOTE — Progress Notes (Signed)
SUBJECTIVE:  Patient ID: Joyce [name], female    DOB: 06/04/1946, 73 y.o.   MRN: [id number] Chief Complaint  Patient presents with  . Follow-up    3week hDA    HPI  Joyce [name] is a 73 y.o. female The patient returns to the office for followup status post intervention of the dialysis access left brachiocephalic AV fistula. Following the intervention the access function has significantly improved, with better flow rates and improved KT/V. The patient has not been experiencing increased bleeding times following decannulation and the patient denies increased recirculation. The patient denies an increase in arm swelling. At the present time the patient denies hand pain.  The patient denies amaurosis fugax or recent TIA symptoms. There are no recent neurological changes noted. The patient denies claudication symptoms or rest pain symptoms. The patient denies history of DVT, PE or superficial thrombophlebitis. The patient denies recent episodes of angina or shortness of breath.   Noninvasive studies show a flow volume of 1411 with patent brachiocephalic AV fistula including the stents.  There are some increased velocities at the region of the AV fistula where the original stents were seen previously.  However these velocity increases or not hemodynamically significant.  New stent shows flow throughout     Past Medical History:  Diagnosis Date  . Anal fissure   . Bipolar affective disorder ([place])   . CKD (chronic kidney disease)    Dr Holley Raring [name] 4  . Colon polyps   . Diverticulitis   . Diverticulitis   . Family history of adverse reaction to anesthesia    mom - PONV  . GERD (gastroesophageal reflux disease)   . Headache    migraines - none over 10 yrs  . Heart murmur   . History of hiatal hernia   . Hypertension   . Pancreatitis    Valproic acid  . Vertigo     Past Surgical History:  Procedure Laterality Date  . A/V FISTULAGRAM Left 04/14/2017   Procedure: A/V  Fistulagram;  Surgeon: Katha Cabal, MD;  Location: [facility];  Service: Cardiovascular;  Laterality: Left;  . A/V FISTULAGRAM Left 06/09/2017   Procedure: A/V FISTULAGRAM;  Surgeon: Katha Cabal, MD;  Location: [facility];  Service: Cardiovascular;  Laterality: Left;  . A/V FISTULAGRAM Left 11/02/2017   Procedure: A/V FISTULAGRAM;  Surgeon: Algernon Huxley, MD;  Location: [facility];  Service: Cardiovascular;  Laterality: Left;  . A/V FISTULAGRAM Left 12/22/2018   Procedure: A/V FISTULAGRAM;  Surgeon: Katha Cabal, MD;  Location: [facility];  Service: Cardiovascular;  Laterality: Left;  . A/V SHUNT INTERVENTION N/A 11/02/2017   Procedure: A/V SHUNT INTERVENTION;  Surgeon: Algernon Huxley, MD;  Location: [facility];  Service: Cardiovascular;  Laterality: N/A;  . ABDOMINAL HYSTERECTOMY  1990 ?  . AV FISTULA PLACEMENT  4/30  . BREAST EXCISIONAL BIOPSY Left 1994   neg surgical bx  . CHOLECYSTECTOMY  2003  . COLONOSCOPY  2014   Dr. Jamal Collin  . COLONOSCOPY WITH PROPOFOL N/A 09/24/2015   Procedure: COLONOSCOPY WITH random colon byopies.;  Surgeon: Lucilla Lame, MD;  Location: [facility];  Service: Endoscopy;  Laterality: N/A;  . ESOPHAGOGASTRODUODENOSCOPY (EGD) WITH PROPOFOL N/A 09/24/2015   Procedure: ESOPHAGOGASTRODUODENOSCOPY (EGD) ;  Surgeon: Lucilla Lame, MD;  Location: [facility];  Service: Endoscopy;  Laterality: N/A;  . EYE SURGERY    . PERIPHERAL VASCULAR CATHETERIZATION N/A 05/29/2015  Procedure: A/V Shuntogram/Fistulagram;  Surgeon: Katha Cabal, MD;  Location: [facility];  Service: Cardiovascular;  Laterality: N/A;  . PERIPHERAL VASCULAR CATHETERIZATION N/A 05/29/2015   Procedure: A/V Shunt Intervention;  Surgeon: Katha Cabal, MD;  Location: [facility];  Service: Cardiovascular;  Laterality: N/A;  . POLYPECTOMY  09/24/2015   Procedure: POLYPECTOMY INTESTINAL;  Surgeon: Lucilla Lame, MD;   Location: [facility];  Service: Endoscopy;;  cecal polyp ascending polyp    Social History   Socioeconomic History  . Marital status: Single    Spouse name: Not on file  . Number of children: 1  . Years of education: Not on file  . Highest education level: Not on file  Occupational History  . 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 Financial resource strain: Not on file  . Food insecurity    Worry: Never true    Inability: Never true  . Transportation needs    Medical: No    Non-medical: No  Tobacco Use  . Smoking status: Never Smoker  . Smokeless tobacco: Never Used  Substance and Sexual Activity  . Alcohol use: No    Alcohol/week: 0.0 standard drinks  . Drug use: No  . Sexual activity: Not Currently  Lifestyle  . Physical activity    Days per week: 7 days    Minutes per session: Not on file  . Stress: Not on file  Relationships  . Social connections    Talks on phone: More than three times a week    Gets together: Not on file    Attends religious service: Not on file    Active member of club or organization: Not on file    Attends meetings of clubs or organizations: Not on file    Relationship status: Divorced  . Intimate partner violence    Fear of current or ex partner: No    Emotionally abused: No    Physically abused: No    Forced sexual activity: No  Other Topics Concern  . 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    Family History  Problem Relation Age of Onset  . Stroke Father   . Hypertension Father   . Breast cancer Other   . Colon cancer Neg Hx   . Liver disease Neg Hx     Allergies  Allergen Reactions  . Morphine And Related Shortness Of Breath    Pt reports chest pain and difficulty breathing.  . Indomethacin Hives  . Pollen Extract Other (See Comments)    Sinus problems and HA     Review of  Systems   Review of Systems: Negative Unless Checked Constitutional: [] Weight loss  [] Fever  [] Chills Cardiac: [] Chest pain   []  Atrial Fibrillation  [] Palpitations   [] Shortness of breath when laying flat   [] Shortness of breath with exertion. [] Shortness of breath at rest Vascular:  [] Pain in legs with walking   [] Pain in legs with standing [] Pain in legs when laying flat   [] Claudication    [] Pain in feet when laying flat    [] History of DVT   [] Phlebitis   [] Swelling in legs   [] Varicose veins   [] Non-healing ulcers Pulmonary:   [] Uses home oxygen   [] Productive cough   [] Hemoptysis   [] Wheeze  [] COPD   [] Asthma Neurologic:  [] Dizziness   []   Seizures  [] Blackouts [] History of stroke   [] History of TIA  [] Aphasia   [] Temporary Blindness   [] Weakness or numbness in arm   [] Weakness or numbness in leg Musculoskeletal:   [] Joint swelling   [] Joint pain   [] Low back pain  []  History of Knee Replacement [] Arthritis [] back Surgeries  []  Spinal Stenosis    Hematologic:  [] Easy bruising  [] Easy bleeding   [] Hypercoagulable state   [x] Anemic Gastrointestinal:  [] Diarrhea   [] Vomiting  [x] Gastroesophageal reflux/heartburn   [] Difficulty swallowing. [] Abdominal pain Genitourinary:  [x] Chronic kidney disease   [] Difficult urination  [] Anuric   [] Blood in urine [] Frequent urination  [] Burning with urination   [] Hematuria Skin:  [] Rashes   [] Ulcers [] Wounds Psychological:  [x] History of anxiety   [x]  History of major depression  []  Memory Difficulties      OBJECTIVE:   Physical Exam  BP 139/70 (BP Location: Right Arm)   Pulse 61   Resp 16   Wt 155 lb (70.3 kg)   BMI 27.46 kg/m   Gen: WD/WN, NAD Head: [place]/AT, No temporalis wasting.  Ear/Nose/Throat: Hearing grossly intact, nares w/o erythema or drainage Eyes: PER, EOMI, sclera nonicteric.  Neck: Supple, no masses.  No JVD.  Pulmonary:  Good air movement, no use of accessory muscles.  Cardiac: RRR Vascular:  Somewhat pulsatile thrill with good  bruit Vessel Right Left  Radial Palpable Palpable   Gastrointestinal: soft, non-distended. No guarding/no peritoneal signs.  Musculoskeletal: M/S 5/5 throughout.  No deformity or atrophy.  Neurologic: Pain and light touch intact in extremities.  Symmetrical.  Speech is fluent. Motor exam as listed above. Psychiatric: Judgment intact, Mood & affect appropriate for pt's clinical situation. Dermatologic: No Venous rashes. No Ulcers Noted.  No changes consistent with cellulitis. Lymph : No Cervical lymphadenopathy, no lichenification or skin changes of chronic lymphedema.       ASSESSMENT AND PLAN:  1. ESRD on dialysis [facility]) Recommend:  The patient is doing well and currently has adequate dialysis access. The patient's dialysis center is not reporting any major access issues.  However, the patient's thrill is somewhat pulsatile at this time.  This raises concerns that the access is at moderate but not high risk for a problem or thrombosis and should be followed more closely  The patient will follow-up with me in the office in 6 months without an HDA  - VAS [place] (AVF, AVG); Future  2. Secondary hypertension Hypertension largely related to end-stage renal disease.  However at this time her hypertension is well controlled.Continue antihypertensive medications as already ordered, these medications have been reviewed and there are no changes at this time.   3. Gastro-esophageal reflux disease without esophagitis Continue PPI as already ordered, this medication has been reviewed and there are no changes at this time.  Avoidence of caffeine and alcohol  Moderate elevation of the head of the bed    Current Outpatient Medications on File Prior to Visit  Medication Sig Dispense Refill  . brimonidine (ALPHAGAN) 0.2 % ophthalmic solution Place 1 drop into both eyes 2 (two) times daily. 5 mL 1  . carbamazepine (TEGRETOL) 100 MG chewable tablet Chew 100 mg by mouth 2  (two) times daily.    Marland Kitchen dicyclomine (BENTYL) 20 MG tablet Take 1 tablet (20 mg total) by mouth 3 (three) times daily before meals. (Patient taking differently: Take 20 mg by mouth 3 (three) times daily after meals. ) 90 tablet 1  . famotidine (PEPCID) 20 MG tablet Take 1  tablet (20 mg total) by mouth daily. 30 tablet 1  . haloperidol (HALDOL) 5 MG tablet Take 2.5 mg by mouth every Monday, Wednesday, and Friday.    Marland Kitchen ipratropium (ATROVENT) 0.03 % nasal spray Place 2 sprays into both nostrils 2 (two) times daily as needed for rhinitis.    Marland Kitchen lidocaine-prilocaine (EMLA) cream Apply 1 application topically Every Tuesday,Thursday,and Saturday with dialysis. At dialysis   3  . metoprolol tartrate (LOPRESSOR) 25 MG tablet Take 1 tablet (25 mg total) by mouth 2 (two) times daily. (Patient taking differently: Take 25 mg by mouth See admin instructions. Take 1 tablet by mouth twice daily, HOLD Morning dose on Dialysis days) 60 tablet 1  . midodrine (PROAMATINE) 10 MG tablet Take 10 mg by mouth See admin instructions. Daily prn as needed on Dialysis days for SBP < 100    . multivitamin (RENA-VIT) TABS tablet TAKE 1 TABLET BY MOUTH EVERYDAY AT BEDTIME (Patient taking differently: Take 1 tablet by mouth daily. ) 30 tablet 0  . rOPINIRole (REQUIP) 2 MG tablet Take 1 tablet (2 mg total) by mouth daily. 30 tablet 3  . timolol (TIMOPTIC) 0.5 % ophthalmic solution Place 1 drop into both eyes 2 (two) times daily. 10 mL 1  . traZODone (DESYREL) 50 MG tablet Take 25 mg by mouth at bedtime.    . vitamin C (ASCORBIC ACID) 250 MG tablet Take 250 mg by mouth 2 (two) times daily.    . ziprasidone (GEODON) 20 MG capsule Take 20 mg by mouth at bedtime.     No current facility-administered medications on file prior to visit.     There are no Patient Instructions on file for this visit. Return in about 6 months (around 07/16/2019) for HDA.   Kris Hartmann, NP  This note was completed with Sales executive.  Any errors are  purely unintentional.

## 2019-01-20 ENCOUNTER — Ambulatory Visit: Payer: Medicare HMO | Admitting: Psychiatry

## 2019-01-26 ENCOUNTER — Other Ambulatory Visit: Payer: Self-pay

## 2019-01-26 ENCOUNTER — Ambulatory Visit (INDEPENDENT_AMBULATORY_CARE_PROVIDER_SITE_OTHER): Payer: Medicare HMO | Admitting: Psychiatry

## 2019-01-26 ENCOUNTER — Encounter: Payer: Self-pay | Admitting: Psychiatry

## 2019-01-26 ENCOUNTER — Encounter

## 2019-01-26 DIAGNOSIS — G4701 Insomnia due to medical condition: Secondary | ICD-10-CM | POA: Diagnosis not present

## 2019-01-26 DIAGNOSIS — F3130 Bipolar disorder, current episode depressed, mild or moderate severity, unspecified: Secondary | ICD-10-CM

## 2019-01-26 DIAGNOSIS — G2581 Restless legs syndrome: Secondary | ICD-10-CM
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
## 2019-03-23 ENCOUNTER — Ambulatory Visit (INDEPENDENT_AMBULATORY_CARE_PROVIDER_SITE_OTHER): Payer: Medicare HMO | Admitting: Psychiatry

## 2019-03-23 ENCOUNTER — Other Ambulatory Visit: Payer: Self-pay

## 2019-03-23 ENCOUNTER — Encounter: Payer: Self-pay | Admitting: Psychiatry

## 2019-03-23 DIAGNOSIS — F3176 Bipolar disorder, in full remission, most recent episode depressed: Secondary | ICD-10-CM

## 2019-03-23 DIAGNOSIS — G4701 Insomnia due to medical condition: Secondary | ICD-10-CM | POA: Diagnosis not present

## 2019-03-23 DIAGNOSIS — G2581 Restless legs syndrome: Secondary | ICD-10-CM | POA: Insufficient documentation

## 2019-03-23 NOTE — Progress Notes (Signed)
Virtual Visit via Video Note  I connected with Joyce [name] on 03/23/19 at  4:30 PM EDT by a video enabled telemedicine application and verified that I am speaking with the correct person using two identifiers.   I discussed the limitations of evaluation and management by telemedicine and the availability of in person appointments. The patient expressed understanding and agreed to proceed.   I discussed the assessment and treatment plan with the patient. The patient was provided an opportunity to ask questions and all were answered. The patient agreed with the plan and demonstrated an understanding of the instructions.   The patient was advised to call back or seek an in-person evaluation if the symptoms worsen or if the condition fails to improve as anticipated.   [facility] MD OP Progress Note  03/23/2019 4:38 PM Joyce [name]  MRN:  [id number]  Chief Complaint:  Chief Complaint    Follow-up     HPI: Joyce [name] is a 73 year old Caucasian female, currently lives in an [facility], has a history of bipolar disorder, multiple medical problems including end-stage renal disease, anemia associated with chronic renal failure on dialysis, hypertension, history of UTI, benign neoplasm of ascending colon was evaluated by telemedicine today.  Patient was offered a video call, due to connection problem it had to be changed to a phone call.  Patient today reports she is currently doing well on the current medication regimen.  She denies any significant anxiety or depressive symptoms at this time.  She denies any manic or hypomanic episodes.  Patient denies any suicidality.  She reports she is compliant on medications as prescribed.  She reports she has been unable to get any of the labs that were requested last visit.  She was also unable to get the EKG which was requested.  She reports she will talk to her nurse and let them call us if they have any questions and get it  done.  Also discussed with patient to send her most updated list of medication to the clinic.  Patient was provided fax #[phone] reports she will fax it to [place].  Patient appeared to be alert, oriented to person place situation.  Patient denies any other concerns today.   Visit Diagnosis:    ICD-10-CM   1. Bipolar I disorder, most recent episode depressed, in remission ([place])  F31.76   2. RLS (restless legs syndrome)  G25.81   3. Insomnia due to medical condition  G47.01     Past Psychiatric History: I have reviewed past psychiatric history from my progress note on 01/26/2019.  Past trials of Zyprexa, Haldol, carbamazepine, trazodone and multiple other medications  Past Medical History:  Past Medical History:  Diagnosis Date  . Anal fissure   . Bipolar affective disorder ([place])   . CKD (chronic kidney disease)    Dr Holley Raring [name] 4  . Colon polyps   . Diverticulitis   . Diverticulitis   . Family history of adverse reaction to anesthesia    mom - PONV  . GERD (gastroesophageal reflux disease)   . Headache    migraines - none over 10 yrs  . Heart murmur   . History of hiatal hernia   . Hypertension   . Pancreatitis    Valproic acid  . Vertigo     Past Surgical History:  Procedure Laterality Date  . A/V FISTULAGRAM Left 04/14/2017   Procedure: A/V Fistulagram;  Surgeon: Katha Cabal, MD;  Location: [facility] INVASIVE CV  LAB;  Service: Cardiovascular;  Laterality: Left;  . A/V FISTULAGRAM Left 06/09/2017   Procedure: A/V FISTULAGRAM;  Surgeon: Katha Cabal, MD;  Location: [place] CV LAB;  Service: Cardiovascular;  Laterality: Left;  . A/V FISTULAGRAM Left 11/02/2017   Procedure: A/V FISTULAGRAM;  Surgeon: Algernon Huxley, MD;  Location: [place] CV LAB;  Service: Cardiovascular;  Laterality: Left;  . A/V FISTULAGRAM Left 12/22/2018   Procedure: A/V FISTULAGRAM;  Surgeon: Katha Cabal, MD;  Location: [place] CV LAB;  Service: Cardiovascular;   Laterality: Left;  . A/V SHUNT INTERVENTION N/A 11/02/2017   Procedure: A/V SHUNT INTERVENTION;  Surgeon: Algernon Huxley, MD;  Location: [place] CV LAB;  Service: Cardiovascular;  Laterality: N/A;  . ABDOMINAL HYSTERECTOMY  1990 ?  . AV FISTULA PLACEMENT  4/30  . BREAST EXCISIONAL BIOPSY Left 1994   neg surgical bx  . CHOLECYSTECTOMY  2003  . COLONOSCOPY  2014   Dr. Jamal Collin  . COLONOSCOPY WITH PROPOFOL N/A 09/24/2015   Procedure: COLONOSCOPY WITH random colon byopies.;  Surgeon: Lucilla Lame, MD;  Location: [place];  Service: Endoscopy;  Laterality: N/A;  . ESOPHAGOGASTRODUODENOSCOPY (EGD) WITH PROPOFOL N/A 09/24/2015   Procedure: ESOPHAGOGASTRODUODENOSCOPY (EGD) ;  Surgeon: Lucilla Lame, MD;  Location: [place];  Service: Endoscopy;  Laterality: N/A;  . EYE SURGERY    . PERIPHERAL VASCULAR CATHETERIZATION N/A 05/29/2015   Procedure: A/V Shuntogram/Fistulagram;  Surgeon: Katha Cabal, MD;  Location: [place] CV LAB;  Service: Cardiovascular;  Laterality: N/A;  . PERIPHERAL VASCULAR CATHETERIZATION N/A 05/29/2015   Procedure: A/V Shunt Intervention;  Surgeon: Katha Cabal, MD;  Location: [place] CV LAB;  Service: Cardiovascular;  Laterality: N/A;  . POLYPECTOMY  09/24/2015   Procedure: POLYPECTOMY INTESTINAL;  Surgeon: Lucilla Lame, MD;  Location: [place];  Service: Endoscopy;;  cecal polyp ascending polyp    Family Psychiatric History: Reviewed family psychiatric history from my progress note on 01/26/2019  Family History:  Family History  Problem Relation Age of Onset  . Stroke Father   . Hypertension Father   . Breast cancer Other   . Colon cancer Neg Hx   . Liver disease Neg Hx   . Mental illness Neg Hx     Social History: Reviewed social history from my progress note on 01/26/2019 Social History   Socioeconomic History  . Marital status: Divorced    Spouse name: Not on file  . Number of children: 1  . Years of education: Not  on file  . Highest education level: Not on file  Occupational History  . 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 Financial resource strain: Not on file  . Food insecurity    Worry: Never true    Inability: Never true  . Transportation needs    Medical: No    Non-medical: No  Tobacco Use  . Smoking status: Never Smoker  . Smokeless tobacco: Never Used  Substance and Sexual Activity  . Alcohol use: No    Alcohol/week: 0.0 standard drinks  . Drug use: No  . Sexual activity: Not Currently  Lifestyle  . Physical activity    Days per week: 7 days    Minutes per session: Not on file  . Stress: Not on file  Relationships  . Social connections    Talks on phone: More than three times a week    Gets together: Not on file  Attends religious service: Not on file    Active member of club or organization: Not on file    Attends meetings of clubs or organizations: Not on file    Relationship status: Divorced  Other Topics Concern  . 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    Allergies:  Allergies  Allergen Reactions  . Morphine And Related Shortness Of Breath    Pt reports chest pain and difficulty breathing.  . Gabapentin   . Indomethacin Hives  . Pollen Extract Other (See Comments)    Sinus problems and HA    Metabolic Disorder Labs: Lab Results  Component Value Date   HGBA1C 4.8 09/18/2017   MPG 91.06 09/18/2017   No results found for: PROLACTIN Lab Results  Component Value Date   CHOL 251 (H) 09/18/2017   TRIG 124 09/18/2017   HDL 66 09/18/2017   CHOLHDL 3.8 09/18/2017   VLDL 25 09/18/2017   LDLCALC 160 (H) 09/18/2017   LDLCALC 43 05/14/2014   Lab Results  Component Value Date   TSH 2.030 12/14/2017   TSH 3.291 09/18/2017    Therapeutic Level Labs: No results found for: LITHIUM No results found for: VALPROATE No  components found for:  CBMZ  Current Medications: Current Outpatient Medications  Medication Sig Dispense Refill  . acetaminophen (TYLENOL) 325 MG tablet take 2 tablet by oral route  as needed    . brimonidine (ALPHAGAN) 0.2 % ophthalmic solution Place 1 drop into both eyes 2 (two) times daily. 5 mL 1  . calcium acetate (PHOSLO) 667 MG capsule     . carbamazepine (TEGRETOL) 100 MG chewable tablet Chew 100 mg by mouth 2 (two) times daily.    . cholecalciferol (VITAMIN D3) 25 MCG (1000 UT) tablet take a 1000 units by mouth daily    . dicyclomine (BENTYL) 20 MG tablet Take 1 tablet (20 mg total) by mouth 3 (three) times daily before meals. (Patient taking differently: Take 20 mg by mouth 3 (three) times daily after meals. ) 90 tablet 1  . epoetin alfa (EPOGEN) 4000 UNIT/ML injection inject 1 mL (4000) units into the vein, Mon, Wed, Fri.    . famotidine (PEPCID) 20 MG tablet Take 1 tablet (20 mg total) by mouth daily. 30 tablet 1  . haloperidol (HALDOL) 5 MG tablet Take 2.5 mg by mouth every Monday, Wednesday, and Friday.    Marland Kitchen ipratropium (ATROVENT) 0.03 % nasal spray Place 2 sprays into both nostrils 2 (two) times daily as needed for rhinitis.    Marland Kitchen ipratropium (ATROVENT) 0.06 % nasal spray     . latanoprost (XALATAN) 0.005 % ophthalmic solution     . lidocaine-prilocaine (EMLA) cream Apply 1 application topically Every Tuesday,Thursday,and Saturday with dialysis. At dialysis   3  . metoprolol tartrate (LOPRESSOR) 25 MG tablet Take 1 tablet (25 mg total) by mouth 2 (two) times daily. (Patient taking differently: Take 25 mg by mouth See admin instructions. Take 1 tablet by mouth twice daily, HOLD Morning dose on Dialysis days) 60 tablet 1  . midodrine (PROAMATINE) 10 MG tablet Take 10 mg by mouth See admin instructions. Daily prn as needed on Dialysis days for SBP < 100    . multivitamin (RENA-VIT) TABS tablet TAKE 1 TABLET BY MOUTH EVERYDAY AT BEDTIME (Patient taking differently: Take 1 tablet by  mouth daily. ) 30 tablet 0  . ondansetron (ZOFRAN) 4 MG tablet     .  pantoprazole (PROTONIX) 40 MG tablet Take by mouth.    Marland Kitchen rOPINIRole (REQUIP) 2 MG tablet Take 1 tablet (2 mg total) by mouth daily. 30 tablet 3  . timolol (BETIMOL) 0.5 % ophthalmic solution Apply to eye.    . timolol (TIMOPTIC) 0.5 % ophthalmic solution Place 1 drop into both eyes 2 (two) times daily. 10 mL 1  . traZODone (DESYREL) 50 MG tablet Take 25 mg by mouth at bedtime.    . vitamin C (ASCORBIC ACID) 250 MG tablet Take 250 mg by mouth 2 (two) times daily.    . ziprasidone (GEODON) 20 MG capsule Take 20 mg by mouth at bedtime.     No current facility-administered medications for this visit.      Musculoskeletal: Strength & Muscle Tone: UTA Gait & Station: Reports as wnl Patient leans: N/A  Psychiatric Specialty Exam: Review of Systems  Psychiatric/Behavioral: Negative.  Negative for depression. The patient is not nervous/anxious.   All other systems reviewed and are negative.   There were no vitals taken for this visit.There is no height or weight on file to calculate BMI.  General Appearance: UTA  Eye Contact:  UTA  Speech:  Clear and Coherent  Volume:  Normal  Mood:  Euthymic  Affect:  UTA  Thought Process:  Goal Directed and Descriptions of Associations: Intact  Orientation:  Full (Time, Place, and Person)  Thought Content: Logical   Suicidal Thoughts:  No  Homicidal Thoughts:  No  Memory:  Immediate;   Fair Recent;   Fair Remote;   Fair  Judgement:  Fair  Insight:  Fair  Psychomotor Activity:  UTA  Concentration:  Concentration: Fair and Attention Span: Fair  Recall:  [employer] of Knowledge: Fair  Language: Fair  Akathisia:  No  Handed:  Right  AIMS (if indicated): UTA  Assets:  Communication Skills Desire for Improvement Housing Social Support  ADL's:  Intact  Cognition: WNL  Sleep:  Fair   Screenings: AIMS     Admission (Discharged) from 02/26/2018 in [place] Admission (Discharged) from 12/15/2017 in [place] Admission (Discharged) from 09/17/2017 in [place] Total Score  0  0  0    AUDIT     Admission (Discharged) from 03/11/2018 in [place] Admission (Discharged) from 02/26/2018 in [place] Admission (Discharged) from 12/15/2017 in [place] Admission (Discharged) from 09/17/2017 in [place]  Alcohol Use Disorder Identification Test Final Score (AUDIT)  0  2  3  0    ECT-MADRS     Admission (Discharged) from 09/17/2017 in [place] Total Score  39    Mini-Mental     Admission (Discharged) from 09/17/2017 in [place]  Total Score (max 30 points )  29    PHQ2-9     Patient Outreach from 02/12/2015 in [facility] Patient Outreach from 10/09/2014 in [place]  PHQ-2 Total Score  0  0       Assessment and Plan: Joyce [name] is a 73 year old Caucasian female who is divorced, lives in [facility], has a history of bipolar disorder, end-stage renal disease, anemia due to chronic renal failure, hypertension, restless leg syndrome was evaluated by telemedicine today.  Patient is biologically predisposed given her history of trauma chronic mental health problems as well as multiple medical issues.  She also has psychosocial stressors  of relationship struggles.  Patient however is currently in a supervised environment and has support system and is compliant on medications.  We will continue plan as noted below.  Plan Bipolar disorder most recent episode depressed moderate-currently stable Carbamazepine 100 mg p.o. twice daily Haldol 2.5 mg every Monday, Wednesday and Friday Geodon 20 mg at bedtime  For restless leg syndrome-stable Ropinirole 2 mg p.o. daily  Insomnia-stable Trazodone 25  mg p.o. nightly  Discussed with patient to fax the most updated medication list to the clinic today.  Patient is in an assisted living facility. Also discussed to get the labs that were ordered last visit including carbamazepine level, CMP, hemoglobin A1c, lipid panel, prolactin and CBC, TSH.  Patient also advised to get an EKG to monitor her QTC.  Patient reports she will get the following labs and EKG done and call the clinic to make an appointment.  Follow-up in clinic 1 to 2 months or sooner if needed.  I have spent atleast 15 MINUTES NON  face to face with patient today. More than 50 % of the time was spent for psychoeducation and supportive psychotherapy and care coordination. This note was generated in part or whole with voice recognition software. Voice recognition is usually quite accurate but there are transcription errors that can and very often do occur. I apologize for any typographical errors that were not detected and corrected.       Ursula Alert, MD 03/24/2019, 9:10 AM

## 2019-04-18 ENCOUNTER — Encounter (INDEPENDENT_AMBULATORY_CARE_PROVIDER_SITE_OTHER): Payer: Medicare HMO

## 2019-04-18 ENCOUNTER — Ambulatory Visit (INDEPENDENT_AMBULATORY_CARE_PROVIDER_SITE_OTHER): Payer: Medicare HMO | Admitting: Vascular Surgery

## 2019-05-20 ENCOUNTER — Emergency Department: Payer: Medicare Other

## 2019-05-20 ENCOUNTER — Emergency Department
Admission: EM | Admit: 2019-05-20 | Discharge: 2019-05-20 | Disposition: A | Discharge status: Home / Self Care | Payer: Medicare Other | Source: Home / Self Care | Attending: Emergency Medicine | Admitting: Emergency Medicine

## 2019-05-20 ENCOUNTER — Encounter: Payer: Self-pay | Admitting: Emergency Medicine

## 2019-05-20 ENCOUNTER — Other Ambulatory Visit: Payer: Self-pay

## 2019-05-20 DIAGNOSIS — Z8249 Family history of ischemic heart disease and other diseases of the circulatory system: Secondary | ICD-10-CM | POA: Diagnosis not present

## 2019-05-20 DIAGNOSIS — Y999 Unspecified external cause status: Secondary | ICD-10-CM | POA: Insufficient documentation

## 2019-05-20 DIAGNOSIS — S20212A Contusion of left front wall of thorax, initial encounter: Secondary | ICD-10-CM

## 2019-05-20 DIAGNOSIS — Z20828 Contact with and (suspected) exposure to other viral communicable diseases: Secondary | ICD-10-CM | POA: Diagnosis not present

## 2019-05-20 DIAGNOSIS — Z992 Dependence on renal dialysis: Secondary | ICD-10-CM | POA: Insufficient documentation

## 2019-05-20 DIAGNOSIS — N2889 Other specified disorders of kidney and ureter: Secondary | ICD-10-CM | POA: Diagnosis present

## 2019-05-20 DIAGNOSIS — Q613 Polycystic kidney, unspecified: Secondary | ICD-10-CM | POA: Diagnosis not present

## 2019-05-20 DIAGNOSIS — D631 Anemia in chronic kidney disease: Secondary | ICD-10-CM | POA: Diagnosis not present

## 2019-05-20 DIAGNOSIS — G2581 Restless legs syndrome: Secondary | ICD-10-CM | POA: Diagnosis not present

## 2019-05-20 DIAGNOSIS — N186 End stage renal disease: Secondary | ICD-10-CM | POA: Insufficient documentation

## 2019-05-20 DIAGNOSIS — F319 Bipolar disorder, unspecified: Secondary | ICD-10-CM | POA: Diagnosis not present

## 2019-05-20 DIAGNOSIS — Z885 Allergy status to narcotic agent status: Secondary | ICD-10-CM | POA: Diagnosis not present

## 2019-05-20 DIAGNOSIS — Z91048 Other nonmedicinal substance allergy status: Secondary | ICD-10-CM | POA: Diagnosis not present

## 2019-05-20 DIAGNOSIS — R634 Abnormal weight loss: Secondary | ICD-10-CM | POA: Diagnosis not present

## 2019-05-20 DIAGNOSIS — R55 Syncope and collapse: Secondary | ICD-10-CM

## 2019-05-20 DIAGNOSIS — Y939 Activity, unspecified: Secondary | ICD-10-CM | POA: Insufficient documentation

## 2019-05-20 DIAGNOSIS — Z915 Personal history of self-harm: Secondary | ICD-10-CM | POA: Diagnosis not present

## 2019-05-20 DIAGNOSIS — Y929 Unspecified place or not applicable: Secondary | ICD-10-CM | POA: Insufficient documentation

## 2019-05-20 DIAGNOSIS — I953 Hypotension of hemodialysis: Secondary | ICD-10-CM | POA: Diagnosis not present

## 2019-05-20 DIAGNOSIS — X58XXXA Exposure to other specified factors, initial encounter: Secondary | ICD-10-CM | POA: Insufficient documentation

## 2019-05-20 DIAGNOSIS — I120 Hypertensive chronic kidney disease with stage 5 chronic kidney disease or end stage renal disease: Secondary | ICD-10-CM | POA: Insufficient documentation

## 2019-05-20 DIAGNOSIS — K589 Irritable bowel syndrome without diarrhea: Secondary | ICD-10-CM | POA: Diagnosis not present

## 2019-05-20 DIAGNOSIS — Z87440 Personal history of urinary (tract) infections: Secondary | ICD-10-CM | POA: Diagnosis not present

## 2019-05-20 DIAGNOSIS — Z79899 Other long term (current) drug therapy: Secondary | ICD-10-CM | POA: Diagnosis not present

## 2019-05-20 DIAGNOSIS — K219 Gastro-esophageal reflux disease without esophagitis: Secondary | ICD-10-CM | POA: Diagnosis not present

## 2019-05-20 DIAGNOSIS — Z888 Allergy status to other drugs, medicaments and biological substances status: Secondary | ICD-10-CM | POA: Diagnosis not present

## 2019-05-20 DIAGNOSIS — N281 Cyst of kidney, acquired: Secondary | ICD-10-CM | POA: Diagnosis not present

## 2019-05-20 LAB — COMPREHENSIVE METABOLIC PANEL
ALT: 14 U/L (ref 0–44)
AST: 17 U/L (ref 15–41)
Albumin: 4 g/dL (ref 3.5–5.0)
Alkaline Phosphatase: 77 U/L (ref 38–126)
Anion gap: 9 (ref 5–15)
BUN: 11 mg/dL (ref 8–23)
CO2: 28 mmol/L (ref 22–32)
Calcium: 8.9 mg/dL (ref 8.9–10.3)
Chloride: 99 mmol/L (ref 98–111)
Creatinine, Ser: 2.05 mg/dL — ABNORMAL HIGH (ref 0.44–1.00)
GFR calc Af Amer: 27 mL/min — ABNORMAL LOW (ref >60)
GFR calc non Af Amer: 24 mL/min — ABNORMAL LOW (ref >60)
Glucose, Bld: 114 mg/dL — ABNORMAL HIGH (ref 70–99)
Potassium: 3.5 mmol/L (ref 3.5–5.1)
Sodium: 136 mmol/L (ref 135–145)
Total Bilirubin: 0.8 mg/dL (ref 0.3–1.2)
Total Protein: 6.7 g/dL (ref 6.5–8.1)

## 2019-05-20 LAB — CBC WITH DIFFERENTIAL/PLATELET
Abs Immature Granulocytes: 0.04 10*3/uL (ref 0.00–0.07)
Basophils Absolute: 0 10*3/uL (ref 0.0–0.1)
Basophils Relative: 0 %
Eosinophils Absolute: 0.1 10*3/uL (ref 0.0–0.5)
Eosinophils Relative: 1 %
HCT: 29.9 % — ABNORMAL LOW (ref 36.0–46.0)
Hemoglobin: 10.1 g/dL — ABNORMAL LOW (ref 12.0–15.0)
Immature Granulocytes: 0 %
Lymphocytes Relative: 9 %
Lymphs Abs: 0.8 10*3/uL (ref 0.7–4.0)
MCH: 32.9 pg (ref 26.0–34.0)
MCHC: 33.8 g/dL (ref 30.0–36.0)
MCV: 97.4 fL (ref 80.0–100.0)
Monocytes Absolute: 0.5 10*3/uL (ref 0.1–1.0)
Monocytes Relative: 6 %
Neutro Abs: 7.4 10*3/uL (ref 1.7–7.7)
Neutrophils Relative %: 84 %
Platelets: 210 10*3/uL (ref 150–400)
RBC: 3.07 MIL/uL — ABNORMAL LOW (ref 3.87–5.11)
RDW: 12.2 % (ref 11.5–15.5)
WBC: 8.9 10*3/uL (ref 4.0–10.5)
nRBC: 0 % (ref 0.0–0.2)

## 2019-05-20 LAB — TROPONIN I (HIGH SENSITIVITY): Troponin I (High Sensitivity): 14 ng/L (ref <18)

## 2019-05-20 MED ORDER — HYDROMORPHONE HCL 1 MG/ML IJ SOLN
0.5000 mg | Freq: Once | INTRAMUSCULAR | Status: AC
  Administered 2019-05-20: 0.5 mg via INTRAVENOUS
  Filled 2019-05-20: qty 1

## 2019-05-20 MED ORDER — ONDANSETRON HCL 4 MG/2ML IJ SOLN
4.0000 mg | Freq: Once | INTRAMUSCULAR | Status: AC
  Administered 2019-05-20: 4 mg via INTRAVENOUS
  Filled 2019-05-20: qty 2

## 2019-05-20 MED ORDER — TRAMADOL HCL 50 MG PO TABS: 50.0000 mg | ORAL_TABLET | Freq: Four times a day (QID) | ORAL | 0 refills | Status: AC | PRN

## 2019-05-20 MED ORDER — MECLIZINE HCL 25 MG PO TABS
25.0000 mg | ORAL_TABLET | Freq: Once | ORAL | Status: AC
  Administered 2019-05-20: 25 mg via ORAL
  Filled 2019-05-20: qty 1

## 2019-05-20 NOTE — ED Notes (Signed)
[facility] who usually transport pt to and from dialysis called to transport pt back to [facility]

## 2019-05-20 NOTE — ED Triage Notes (Signed)
Pt via EMS from [facility]. Pt was at dialysis today when she went to take a BM pt had a syncopal episode. She does not remember the fall, she only remember waking up on the floor. Pt states she thinks she hit her head on the toilet because her glasses were in the toilet. Pt also c/o L rib pain.

## 2019-05-20 NOTE — ED Notes (Signed)
Pt c/o L rib pain when she fell from a syncopal episode this am. Pt does not remember the fall but say she thinks she hit her head on the toilet because her glasses were in the toilet.

## 2019-05-20 NOTE — ED Provider Notes (Signed)
[facility] Emergency Department Provider Note       Time seen: ----------------------------------------- 11:20 AM on 05/20/2019 -----------------------------------------   I have reviewed the triage vital signs and the nursing notes.  HISTORY   Chief Complaint Loss of Consciousness    HPI Joyce [name] is a 73 y.o. female with a history of bipolar disorder, diverticulitis, GERD, hypertension, pancreatitis who presents to the ED for syncope.  Patient arrives from [facility], was at dialysis today when she had a bowel movement and resulted in a syncopal event.  She does not remember the fall, only remembers waking up on the floor.  She does think she hit her head because she has a headache and she is also complaining of left-sided rib pain.  She is concerned about ruptured cyst on her kidneys.  Past Medical History:  Diagnosis Date  . Anal fissure   . Bipolar affective disorder ([place])   . CKD (chronic kidney disease)    Dr Holley Raring [name] 4  . Colon polyps   . Diverticulitis   . Diverticulitis   . Family history of adverse reaction to anesthesia    mom - PONV  . GERD (gastroesophageal reflux disease)   . Headache    migraines - none over 10 yrs  . Heart murmur   . History of hiatal hernia   . Hypertension   . Pancreatitis    Valproic acid  . Vertigo     Patient Active Problem List   Diagnosis Date Noted  . RLS (restless legs syndrome) 03/23/2019  . Insomnia due to medical condition 03/23/2019  . End stage renal disease ([place]) 01/26/2019  . Gastro-esophageal reflux disease without esophagitis 06/29/2018  . Bipolar disorder, current episode mixed, mild ([place]) 06/29/2018  . Cardiac murmur, unspecified 06/29/2018  . Neurocognitive disorder 06/29/2018  . Other infective otitis externa, left ear 06/29/2018  . Vitamin B12 deficiency anemia 06/29/2018  . Suicide attempt ([place]) 03/09/2018  . Acute respiratory failure with hypoxia ([place]) 03/04/2018   . Bipolar I disorder, most recent episode depressed ([place]) 02/26/2018  . UTI (urinary tract infection) 12/16/2017  . Palliative care encounter   . Bipolar affective disorder, current episode depressed with psychotic symptoms ([place]) 09/17/2017  . Bipolar I disorder, most recent episode (or current) manic ([place]) 09/02/2017  . Acute delirium 09/02/2017  . Altered mental status   . Acute encephalopathy 09/01/2017  . ESRD on dialysis ([place]) 04/06/2017  . Complication of vascular access for dialysis 04/06/2017  . Hematuria 12/18/2016  . Polycystic kidney 10/12/2016  . Ruptured cyst of kidney 10/03/2016  . HTN (hypertension) 10/01/2016  . Chronic kidney disease 10/01/2016  . Anemia associated with chronic renal failure 09/24/2016  . Acute kidney insufficiency 07/28/2016  . Noninfectious diarrhea   . Benign neoplasm of cecum   . Benign neoplasm of ascending colon   . Diarrhea   . Nausea   . Gastric polyp   . Chronic constipation 12/27/2014  . Nausea with vomiting 12/27/2014  . Left sided abdominal pain 12/27/2014  . Personal history of colonic polyps 05/10/2013    Past Surgical History:  Procedure Laterality Date  . A/V FISTULAGRAM Left 04/14/2017   Procedure: A/V Fistulagram;  Surgeon: Katha Cabal, MD;  Location: [place] [facility];  Service: Cardiovascular;  Laterality: Left;  . A/V FISTULAGRAM Left 06/09/2017   Procedure: A/V FISTULAGRAM;  Surgeon: Katha Cabal, MD;  Location: [place] [facility];  Service: Cardiovascular;  Laterality: Left;  . A/V FISTULAGRAM Left  11/02/2017   Procedure: A/V FISTULAGRAM;  Surgeon: Algernon Huxley, MD;  Location: [place] [facility];  Service: Cardiovascular;  Laterality: Left;  . A/V FISTULAGRAM Left 12/22/2018   Procedure: A/V FISTULAGRAM;  Surgeon: Katha Cabal, MD;  Location: [place] [facility];  Service: Cardiovascular;  Laterality: Left;  . A/V SHUNT INTERVENTION N/A 11/02/2017   Procedure: A/V SHUNT INTERVENTION;  Surgeon:  Algernon Huxley, MD;  Location: [place] [facility];  Service: Cardiovascular;  Laterality: N/A;  . ABDOMINAL HYSTERECTOMY  1990 ?  . AV FISTULA PLACEMENT  4/30  . BREAST EXCISIONAL BIOPSY Left 1994   neg surgical bx  . CHOLECYSTECTOMY  2003  . COLONOSCOPY  2014   Dr. Jamal Collin  . COLONOSCOPY WITH PROPOFOL N/A 09/24/2015   Procedure: COLONOSCOPY WITH random colon byopies.;  Surgeon: Lucilla Lame, MD;  Location: [place];  Service: Endoscopy;  Laterality: N/A;  . ESOPHAGOGASTRODUODENOSCOPY (EGD) WITH PROPOFOL N/A 09/24/2015   Procedure: ESOPHAGOGASTRODUODENOSCOPY (EGD) ;  Surgeon: Lucilla Lame, MD;  Location: [place];  Service: Endoscopy;  Laterality: N/A;  . EYE SURGERY    . PERIPHERAL VASCULAR CATHETERIZATION N/A 05/29/2015   Procedure: A/V Shuntogram/Fistulagram;  Surgeon: Katha Cabal, MD;  Location: [place] [facility];  Service: Cardiovascular;  Laterality: N/A;  . PERIPHERAL VASCULAR CATHETERIZATION N/A 05/29/2015   Procedure: A/V Shunt Intervention;  Surgeon: Katha Cabal, MD;  Location: [place] [facility];  Service: Cardiovascular;  Laterality: N/A;  . POLYPECTOMY  09/24/2015   Procedure: POLYPECTOMY INTESTINAL;  Surgeon: Lucilla Lame, MD;  Location: [facility];  Service: Endoscopy;;  cecal polyp ascending polyp    Allergies Morphine and related, Gabapentin, Indomethacin, and Pollen extract  Social History Social History   Tobacco Use  . Smoking status: Never Smoker  . Smokeless tobacco: Never Used  Substance Use Topics  . Alcohol use: No    Alcohol/week: 0.0 standard drinks  . Drug use: No   Review of Systems Constitutional: Negative for fever. Cardiovascular: Positive for chest pain Respiratory: Negative for shortness of breath. Gastrointestinal: Negative for abdominal pain, vomiting and diarrhea. Musculoskeletal: Positive for left-sided rib pain Skin: Negative for rash. Neurological: Positive for headache  All systems  negative/normal/unremarkable except as stated in the HPI  ____________________________________________   PHYSICAL EXAM:  VITAL SIGNS: ED Triage Vitals  Enc Vitals Group     BP --      Pulse Rate 05/20/19 1115 74     Resp 05/20/19 1115 15     Temp 05/20/19 1115 97.8 F (36.6 C)     Temp Source 05/20/19 1115 Oral     SpO2 05/20/19 1115 100 %     Weight 05/20/19 1117 153 lb (69.4 kg)     Height 05/20/19 1117 5\' 3"  (1.6 m)     Head Circumference --      Peak Flow --      Pain Score 05/20/19 1116 10     Pain Loc --      Pain Edu? --      Excl. in [place]? --    Constitutional: Alert and oriented.  Mild distress Eyes: Conjunctivae are normal. Normal extraocular movements. ENT      Head: Normocephalic      Nose: No congestion/rhinnorhea.      Mouth/Throat: Mucous membranes are moist.      Neck: No stridor. Cardiovascular: Normal rate, regular rhythm. No murmurs, rubs, or gallops. Respiratory: Normal respiratory effort without tachypnea nor retractions. Breath sounds are  clear and equal bilaterally. No wheezes/rales/rhonchi. Gastrointestinal: Soft and nontender. Normal bowel sounds Musculoskeletal: Left inferior axillary rib tenderness Neurologic:  Normal speech and language. No gross focal neurologic deficits are appreciated.  Skin:  Skin is warm, dry and intact. No rash noted. Psychiatric: Mood and affect are normal. Speech and behavior are normal.  ____________________________________________  EKG: Interpreted by me.  Sinus rhythm with rate of 69 bpm, left axis deviation, possible septal infarct, normal QT  ____________________________________________  ED COURSE:  As part of my medical decision making, I reviewed the following data within the [facility] History obtained from family if available, nursing notes, old chart and ekg, as well as notes from prior ED visits. Patient presented for syncope, we will assess with labs and imaging as indicated at this  time.   Procedures  Oluwasemilore Pascuzzi Burkett was evaluated in Emergency Department on 05/20/2019 for the symptoms described in the history of present illness. She was evaluated in the context of the global COVID-19 pandemic, which necessitated consideration that the patient might be at risk for infection with the SARS-CoV-2 virus that causes COVID-19. Institutional protocols and algorithms that pertain to the evaluation of patients at risk for COVID-19 are in a state of rapid change based on information released by regulatory bodies including the CDC and federal and state organizations. These policies and algorithms were followed during the patient's care in the ED.  ____________________________________________   LABS (pertinent positives/negatives)  Labs Reviewed  CBC WITH DIFFERENTIAL/PLATELET  COMPREHENSIVE METABOLIC PANEL  URINALYSIS, COMPLETE (UACMP) WITH MICROSCOPIC  CBG MONITORING, ED    RADIOLOGY Images were viewed by me  CT head, left rib series  IMPRESSION:  Chronic polycystic kidney disease without definite hydronephrosis by  ultrasound. No other acute finding.  IMPRESSION:  1. No acute intracranial abnormalities.  2. Small left mastoid effusion.  3. Mild chronic microvascular ischemic changes in the cerebral white  matter, as above.  IMPRESSION:  No evident rib fracture. No edema or consolidation. Cardiomegaly.  Aortic Atherosclerosis (ICD10-I70.0).  ____________________________________________   DIFFERENTIAL DIAGNOSIS   Vasovagal syncope, dehydration, arrhythmia, electrolyte abnormality, renal failure, fracture, subdural  FINAL ASSESSMENT AND PLAN  Syncope, rib contusion   Plan: The patient had presented for syncope with resulting head injury and left rib pain. Patient's labs did not reveal any acute process. Patient's imaging was overall reassuring.  She will be discharged with pain medicine and is otherwise cleared for outpatient follow-up.  Syncope was likely  vasovagal in origin.   Laurence Aly, MD    Note: This note was generated in part or whole with voice recognition software. Voice recognition is usually quite accurate but there are transcription errors that can and very often do occur. I apologize for any typographical errors that were not detected and corrected.     Earleen Newport, MD 05/20/19 [phone]

## 2019-05-21 ENCOUNTER — Other Ambulatory Visit: Payer: Self-pay

## 2019-05-21 ENCOUNTER — Encounter: Payer: Self-pay | Admitting: Emergency Medicine

## 2019-05-21 DIAGNOSIS — R55 Syncope and collapse: Secondary | ICD-10-CM | POA: Diagnosis present

## 2019-05-21 DIAGNOSIS — Z20828 Contact with and (suspected) exposure to other viral communicable diseases: Secondary | ICD-10-CM | POA: Diagnosis present

## 2019-05-21 DIAGNOSIS — Z915 Personal history of self-harm: Secondary | ICD-10-CM

## 2019-05-21 DIAGNOSIS — Z79899 Other long term (current) drug therapy: Secondary | ICD-10-CM

## 2019-05-21 DIAGNOSIS — N2889 Other specified disorders of kidney and ureter: Secondary | ICD-10-CM | POA: Diagnosis present

## 2019-05-21 DIAGNOSIS — Z87440 Personal history of urinary (tract) infections: Secondary | ICD-10-CM

## 2019-05-21 DIAGNOSIS — Z8249 Family history of ischemic heart disease and other diseases of the circulatory system: Secondary | ICD-10-CM

## 2019-05-21 DIAGNOSIS — N281 Cyst of kidney, acquired: Principal | ICD-10-CM | POA: Diagnosis present

## 2019-05-21 DIAGNOSIS — K589 Irritable bowel syndrome without diarrhea: Secondary | ICD-10-CM | POA: Diagnosis not present

## 2019-05-21 DIAGNOSIS — Q613 Polycystic kidney, unspecified: Secondary | ICD-10-CM

## 2019-05-21 DIAGNOSIS — D631 Anemia in chronic kidney disease: Secondary | ICD-10-CM | POA: Diagnosis not present

## 2019-05-21 DIAGNOSIS — I120 Hypertensive chronic kidney disease with stage 5 chronic kidney disease or end stage renal disease: Secondary | ICD-10-CM | POA: Diagnosis not present

## 2019-05-21 DIAGNOSIS — Z885 Allergy status to narcotic agent status: Secondary | ICD-10-CM

## 2019-05-21 DIAGNOSIS — Z888 Allergy status to other drugs, medicaments and biological substances status: Secondary | ICD-10-CM

## 2019-05-21 DIAGNOSIS — K219 Gastro-esophageal reflux disease without esophagitis: Secondary | ICD-10-CM | POA: Diagnosis present

## 2019-05-21 DIAGNOSIS — F319 Bipolar disorder, unspecified: Secondary | ICD-10-CM | POA: Diagnosis not present

## 2019-05-21 DIAGNOSIS — R634 Abnormal weight loss: Secondary | ICD-10-CM | POA: Diagnosis not present

## 2019-05-21 DIAGNOSIS — Z992 Dependence on renal dialysis: Secondary | ICD-10-CM | POA: Diagnosis not present

## 2019-05-21 DIAGNOSIS — I953 Hypotension of hemodialysis: Secondary | ICD-10-CM | POA: Diagnosis not present

## 2019-05-21 DIAGNOSIS — N186 End stage renal disease: Secondary | ICD-10-CM | POA: Diagnosis present

## 2019-05-21 DIAGNOSIS — X58XXXA Exposure to other specified factors, initial encounter: Secondary | ICD-10-CM | POA: Diagnosis present

## 2019-05-21 DIAGNOSIS — G2581 Restless legs syndrome: Secondary | ICD-10-CM | POA: Diagnosis not present

## 2019-05-21 DIAGNOSIS — S20212A Contusion of left front wall of thorax, initial encounter: Secondary | ICD-10-CM | POA: Diagnosis present

## 2019-05-21 DIAGNOSIS — Z91048 Other nonmedicinal substance allergy status: Secondary | ICD-10-CM

## 2019-05-21 LAB — CBC
HCT: 28.4 % — ABNORMAL LOW (ref 36.0–46.0)
Hemoglobin: 9.4 g/dL — ABNORMAL LOW (ref 12.0–15.0)
MCH: 33 pg (ref 26.0–34.0)
MCHC: 33.1 g/dL (ref 30.0–36.0)
MCV: 99.6 fL (ref 80.0–100.0)
Platelets: 215 10*3/uL (ref 150–400)
RBC: 2.85 MIL/uL — ABNORMAL LOW (ref 3.87–5.11)
RDW: 12.1 % (ref 11.5–15.5)
WBC: 8.5 10*3/uL (ref 4.0–10.5)
nRBC: 0 % (ref 0.0–0.2)

## 2019-05-21 LAB — URINALYSIS, COMPLETE (UACMP) WITH MICROSCOPIC
Bilirubin Urine: NEGATIVE
Glucose, UA: NEGATIVE mg/dL
Ketones, ur: NEGATIVE mg/dL
Nitrite: NEGATIVE
Protein, ur: 100 mg/dL — AB
RBC / HPF: >50 RBC/hpf — ABNORMAL HIGH (ref 0–5)
Specific Gravity, Urine: 1.005 (ref 1.005–1.030)
Squamous Epithelial / HPF: NONE SEEN (ref 0–5)
WBC, UA: >50 WBC/hpf — ABNORMAL HIGH (ref 0–5)
pH: 8 (ref 5.0–8.0)

## 2019-05-21 NOTE — ED Triage Notes (Signed)
FIRST NURSE NOTE:  Pt arrived via [facility] from [facility] with reports of discolored urine since this morning. Pt has hx of hematuria.  Pt is dialysis pt was seen in ED yesterday for a fall.

## 2019-05-21 NOTE — ED Triage Notes (Signed)
States appears blood in urine this am. States has tumors in both kidneys.

## 2019-05-22 ENCOUNTER — Emergency Department: Payer: Medicare Other

## 2019-05-22 ENCOUNTER — Observation Stay
Admit: 2019-05-22 | Discharge: 2019-05-22 | Disposition: A | Discharge status: Home / Self Care | Payer: Medicare Other | Attending: Internal Medicine | Admitting: Internal Medicine

## 2019-05-22 ENCOUNTER — Inpatient Hospital Stay
Admission: EM | Admit: 2019-05-22 | Discharge: 2019-05-24 | DRG: 699 | Disposition: A | Discharge status: Home / Self Care | Payer: Medicare Other | Source: Ambulatory Visit | Attending: Internal Medicine | Admitting: Internal Medicine

## 2019-05-22 ENCOUNTER — Encounter: Payer: Self-pay | Admitting: *Deleted

## 2019-05-22 ENCOUNTER — Other Ambulatory Visit: Payer: Self-pay

## 2019-05-22 DIAGNOSIS — R109 Unspecified abdominal pain: Secondary | ICD-10-CM | POA: Diagnosis present

## 2019-05-22 DIAGNOSIS — F319 Bipolar disorder, unspecified: Secondary | ICD-10-CM | POA: Diagnosis present

## 2019-05-22 DIAGNOSIS — K219 Gastro-esophageal reflux disease without esophagitis: Secondary | ICD-10-CM | POA: Diagnosis not present

## 2019-05-22 DIAGNOSIS — K589 Irritable bowel syndrome without diarrhea: Secondary | ICD-10-CM | POA: Diagnosis not present

## 2019-05-22 DIAGNOSIS — Z20828 Contact with and (suspected) exposure to other viral communicable diseases: Secondary | ICD-10-CM | POA: Diagnosis not present

## 2019-05-22 DIAGNOSIS — F3110 Bipolar disorder, current episode manic without psychotic features, unspecified: Secondary | ICD-10-CM | POA: Diagnosis present

## 2019-05-22 DIAGNOSIS — I953 Hypotension of hemodialysis: Secondary | ICD-10-CM | POA: Diagnosis not present

## 2019-05-22 DIAGNOSIS — Z87440 Personal history of urinary (tract) infections: Secondary | ICD-10-CM | POA: Diagnosis not present

## 2019-05-22 DIAGNOSIS — Q613 Polycystic kidney, unspecified: Secondary | ICD-10-CM | POA: Diagnosis not present

## 2019-05-22 DIAGNOSIS — G2581 Restless legs syndrome: Secondary | ICD-10-CM | POA: Diagnosis not present

## 2019-05-22 DIAGNOSIS — N281 Cyst of kidney, acquired: Secondary | ICD-10-CM | POA: Diagnosis not present

## 2019-05-22 DIAGNOSIS — Z91048 Other nonmedicinal substance allergy status: Secondary | ICD-10-CM | POA: Diagnosis not present

## 2019-05-22 DIAGNOSIS — D631 Anemia in chronic kidney disease: Secondary | ICD-10-CM | POA: Diagnosis not present

## 2019-05-22 DIAGNOSIS — R55 Syncope and collapse: Secondary | ICD-10-CM | POA: Diagnosis not present

## 2019-05-22 DIAGNOSIS — Z79899 Other long term (current) drug therapy: Secondary | ICD-10-CM | POA: Diagnosis not present

## 2019-05-22 DIAGNOSIS — Z992 Dependence on renal dialysis: Secondary | ICD-10-CM | POA: Diagnosis not present

## 2019-05-22 DIAGNOSIS — N2889 Other specified disorders of kidney and ureter: Secondary | ICD-10-CM | POA: Diagnosis present

## 2019-05-22 DIAGNOSIS — Z8249 Family history of ischemic heart disease and other diseases of the circulatory system: Secondary | ICD-10-CM | POA: Diagnosis not present

## 2019-05-22 DIAGNOSIS — X58XXXA Exposure to other specified factors, initial encounter: Secondary | ICD-10-CM | POA: Diagnosis present

## 2019-05-22 DIAGNOSIS — N189 Chronic kidney disease, unspecified: Secondary | ICD-10-CM | POA: Diagnosis present

## 2019-05-22 DIAGNOSIS — Z915 Personal history of self-harm: Secondary | ICD-10-CM | POA: Diagnosis not present

## 2019-05-22 DIAGNOSIS — Z885 Allergy status to narcotic agent status: Secondary | ICD-10-CM | POA: Diagnosis not present

## 2019-05-22 DIAGNOSIS — N186 End stage renal disease: Secondary | ICD-10-CM | POA: Diagnosis not present

## 2019-05-22 DIAGNOSIS — I120 Hypertensive chronic kidney disease with stage 5 chronic kidney disease or end stage renal disease: Secondary | ICD-10-CM | POA: Diagnosis not present

## 2019-05-22 DIAGNOSIS — R319 Hematuria, unspecified: Secondary | ICD-10-CM | POA: Diagnosis present

## 2019-05-22 DIAGNOSIS — R634 Abnormal weight loss: Secondary | ICD-10-CM | POA: Diagnosis not present

## 2019-05-22 DIAGNOSIS — Z888 Allergy status to other drugs, medicaments and biological substances status: Secondary | ICD-10-CM | POA: Diagnosis not present

## 2019-05-22 LAB — CBC
HCT: 25.5 % — ABNORMAL LOW (ref 36.0–46.0)
Hemoglobin: 8.6 g/dL — ABNORMAL LOW (ref 12.0–15.0)
MCH: 33.5 pg (ref 26.0–34.0)
MCHC: 33.7 g/dL (ref 30.0–36.0)
MCV: 99.2 fL (ref 80.0–100.0)
Platelets: 203 10*3/uL (ref 150–400)
RBC: 2.57 MIL/uL — ABNORMAL LOW (ref 3.87–5.11)
RDW: 12.4 % (ref 11.5–15.5)
WBC: 6.7 10*3/uL (ref 4.0–10.5)
nRBC: 0 % (ref 0.0–0.2)

## 2019-05-22 LAB — HEMOGLOBIN AND HEMATOCRIT, BLOOD
HCT: 28.3 % — ABNORMAL LOW (ref 36.0–46.0)
HCT: 26.9 % — ABNORMAL LOW (ref 36.0–46.0)
Hemoglobin: 9.3 g/dL — ABNORMAL LOW (ref 12.0–15.0)
Hemoglobin: 8.9 g/dL — ABNORMAL LOW (ref 12.0–15.0)

## 2019-05-22 LAB — BASIC METABOLIC PANEL
Anion gap: 14 (ref 5–15)
BUN: 33 mg/dL — ABNORMAL HIGH (ref 8–23)
CO2: 24 mmol/L (ref 22–32)
Calcium: 9 mg/dL (ref 8.9–10.3)
Chloride: 97 mmol/L — ABNORMAL LOW (ref 98–111)
Creatinine, Ser: 5.29 mg/dL — ABNORMAL HIGH (ref 0.44–1.00)
GFR calc Af Amer: 9 mL/min — ABNORMAL LOW (ref >60)
GFR calc non Af Amer: 8 mL/min — ABNORMAL LOW (ref >60)
Glucose, Bld: 100 mg/dL — ABNORMAL HIGH (ref 70–99)
Potassium: 4.3 mmol/L (ref 3.5–5.1)
Sodium: 135 mmol/L (ref 135–145)

## 2019-05-22 LAB — ECHOCARDIOGRAM COMPLETE

## 2019-05-22 LAB — SARS CORONAVIRUS 2 (TAT 6-24 HRS): SARS Coronavirus 2: NEGATIVE

## 2019-05-22 MED ORDER — CHLORHEXIDINE GLUCONATE CLOTH 2 % EX PADS
6.0000 | MEDICATED_PAD | Freq: Every day | CUTANEOUS | Status: DC
  Administered 2019-05-23: 6 via TOPICAL
  Filled 2019-05-22: qty 6

## 2019-05-22 MED ORDER — ACETAMINOPHEN 325 MG PO TABS
650.0000 mg | ORAL_TABLET | Freq: Four times a day (QID) | ORAL | Status: DC | PRN
  Administered 2019-05-22: 650 mg via ORAL
  Filled 2019-05-22: qty 2

## 2019-05-22 MED ORDER — SODIUM CHLORIDE 0.9 % IV SOLN
1.0000 g | INTRAVENOUS | Status: DC
  Administered 2019-05-22 – 2019-05-23 (×2): 1 g via INTRAVENOUS
  Filled 2019-05-22 (×2): qty 10
  Filled 2019-05-22: qty 1
  Filled 2019-05-22: qty 10

## 2019-05-22 MED ORDER — ROPINIROLE HCL 1 MG PO TABS
2.0000 mg | ORAL_TABLET | Freq: Every day | ORAL | Status: DC
  Administered 2019-05-22 – 2019-05-24 (×3): 2 mg via ORAL
  Filled 2019-05-22 (×3): qty 2

## 2019-05-22 MED ORDER — RENA-VITE PO TABS
1.0000 | ORAL_TABLET | Freq: Every day | ORAL | Status: DC
  Administered 2019-05-22 – 2019-05-24 (×3): 1 via ORAL
  Filled 2019-05-22 (×3): qty 1

## 2019-05-22 MED ORDER — ONDANSETRON HCL 4 MG PO TABS
4.0000 mg | ORAL_TABLET | Freq: Four times a day (QID) | ORAL | Status: DC | PRN
  Administered 2019-05-22: 21:00:00 4 mg via ORAL
  Filled 2019-05-22 (×2): qty 1

## 2019-05-22 MED ORDER — DICYCLOMINE HCL 20 MG PO TABS
20.0000 mg | ORAL_TABLET | Freq: Three times a day (TID) | ORAL | Status: DC
  Administered 2019-05-22 – 2019-05-24 (×8): 20 mg via ORAL
  Filled 2019-05-22 (×11): qty 1

## 2019-05-22 MED ORDER — PANTOPRAZOLE SODIUM 40 MG PO TBEC
40.0000 mg | DELAYED_RELEASE_TABLET | Freq: Every day | ORAL | Status: DC
  Administered 2019-05-22 – 2019-05-24 (×3): 40 mg via ORAL
  Filled 2019-05-22 (×3): qty 1

## 2019-05-22 MED ORDER — ZIPRASIDONE HCL 20 MG PO CAPS
20.0000 mg | ORAL_CAPSULE | Freq: Every day | ORAL | Status: DC
  Administered 2019-05-22 – 2019-05-24 (×2): 20 mg via ORAL
  Filled 2019-05-22 (×5): qty 1

## 2019-05-22 MED ORDER — TRAMADOL HCL 50 MG PO TABS
50.0000 mg | ORAL_TABLET | Freq: Four times a day (QID) | ORAL | Status: DC | PRN
  Administered 2019-05-24: 50 mg via ORAL
  Filled 2019-05-22: qty 1

## 2019-05-22 MED ORDER — BRIMONIDINE TARTRATE 0.2 % OP SOLN
1.0000 [drp] | Freq: Two times a day (BID) | OPHTHALMIC | Status: DC
  Administered 2019-05-22 – 2019-05-24 (×5): 1 [drp] via OPHTHALMIC
  Filled 2019-05-22: qty 5

## 2019-05-22 MED ORDER — TRAZODONE HCL 50 MG PO TABS
25.0000 mg | ORAL_TABLET | Freq: Every day | ORAL | Status: DC
  Administered 2019-05-22 – 2019-05-24 (×2): 25 mg via ORAL
  Filled 2019-05-22: qty 0.5
  Filled 2019-05-22: qty 1

## 2019-05-22 MED ORDER — ACETAMINOPHEN 650 MG RE SUPP
650.0000 mg | Freq: Four times a day (QID) | RECTAL | Status: DC | PRN
  Filled 2019-05-22: qty 1

## 2019-05-22 MED ORDER — CARBAMAZEPINE 100 MG PO CHEW
100.0000 mg | CHEWABLE_TABLET | Freq: Two times a day (BID) | ORAL | Status: DC
  Administered 2019-05-22 – 2019-05-24 (×5): 100 mg via ORAL
  Filled 2019-05-22 (×9): qty 1

## 2019-05-22 MED ORDER — FAMOTIDINE 20 MG PO TABS
20.0000 mg | ORAL_TABLET | Freq: Every day | ORAL | Status: DC
  Administered 2019-05-22 – 2019-05-24 (×3): 20 mg via ORAL
  Filled 2019-05-22 (×3): qty 1

## 2019-05-22 MED ORDER — HALOPERIDOL 2 MG PO TABS
2.5000 mg | ORAL_TABLET | ORAL | Status: DC
  Administered 2019-05-23: 2.5 mg via ORAL
  Filled 2019-05-22 (×4): qty 1

## 2019-05-22 MED ORDER — IPRATROPIUM BROMIDE 0.03 % NA SOLN
2.0000 | Freq: Two times a day (BID) | NASAL | Status: DC | PRN
  Filled 2019-05-22: qty 30

## 2019-05-22 MED ORDER — ONDANSETRON HCL 4 MG/2ML IJ SOLN: 4.0000 mg | Freq: Four times a day (QID) | INTRAMUSCULAR | Status: DC | PRN

## 2019-05-22 MED ORDER — OXYCODONE HCL 5 MG PO TABS
5.0000 mg | ORAL_TABLET | Freq: Four times a day (QID) | ORAL | Status: DC | PRN
  Administered 2019-05-22 – 2019-05-24 (×4): 5 mg via ORAL
  Filled 2019-05-22 (×5): qty 1

## 2019-05-22 MED ORDER — EPOETIN ALFA 4000 UNIT/ML IJ SOLN
4000.0000 [IU] | INTRAMUSCULAR | Status: DC
  Filled 2019-05-22: qty 1

## 2019-05-22 MED ORDER — LATANOPROST 0.005 % OP SOLN
1.0000 [drp] | Freq: Every day | OPHTHALMIC | Status: DC
  Administered 2019-05-22 – 2019-05-23 (×2): 1 [drp] via OPHTHALMIC
  Filled 2019-05-22: qty 2.5

## 2019-05-22 MED ORDER — FENTANYL CITRATE (PF) 100 MCG/2ML IJ SOLN
25.0000 ug | Freq: Once | INTRAMUSCULAR | Status: AC
  Administered 2019-05-22: 25 ug via INTRAVENOUS
  Filled 2019-05-22: qty 2

## 2019-05-22 MED ORDER — TIMOLOL MALEATE 0.5 % OP SOLN
1.0000 [drp] | Freq: Two times a day (BID) | OPHTHALMIC | Status: DC
  Administered 2019-05-22 – 2019-05-24 (×5): 1 [drp] via OPHTHALMIC
  Filled 2019-05-22: qty 5

## 2019-05-22 NOTE — ED Provider Notes (Signed)
[facility] Emergency Department Provider Note  ____________________________________________   First MD Initiated Contact with Patient 05/22/19 [phone]     (approximate)  I have reviewed the triage vital signs and the nursing notes.   HISTORY  Chief Complaint Hematuria   HPI Joyce [name] is a 73 y.o. female with below list of previous medical conditions including polycystic kidney disease presents to the emergency department with history of syncopal episode yesterday with resultant left flank pain injury while at [facility] for dialysis.  Patient was seen in the emergency department and discharged home.  Patient presents tonight secondary to hematuria noted this morning.  Patient denies any fever.  Patient states that her current pain score is 6 out of 10.  Patient denies any dizziness no nausea or vomiting.  Patient denies any headache.        Past Medical History:  Diagnosis Date  . Anal fissure   . Bipolar affective disorder ([place])   . CKD (chronic kidney disease)    Dr Holley Raring [name] 4  . Colon polyps   . Diverticulitis   . Diverticulitis   . Family history of adverse reaction to anesthesia    mom - PONV  . GERD (gastroesophageal reflux disease)   . Headache    migraines - none over 10 yrs  . Heart murmur   . History of hiatal hernia   . Hypertension   . Pancreatitis    Valproic acid  . Vertigo     Patient Active Problem List   Diagnosis Date Noted  . Renal cyst, native, hemorrhage 05/22/2019  . RLS (restless legs syndrome) 03/23/2019  . Insomnia due to medical condition 03/23/2019  . End stage renal disease ([place]) 01/26/2019  . Gastro-esophageal reflux disease without esophagitis 06/29/2018  . Bipolar disorder, current episode mixed, mild ([place]) 06/29/2018  . Cardiac murmur, unspecified 06/29/2018  . Neurocognitive disorder 06/29/2018  . Other infective otitis externa, left ear 06/29/2018  . Vitamin B12 deficiency anemia 06/29/2018  .  Suicide attempt ([place]) 03/09/2018  . Acute respiratory failure with hypoxia ([place]) 03/04/2018  . Bipolar I disorder, most recent episode depressed ([place]) 02/26/2018  . UTI (urinary tract infection) 12/16/2017  . Palliative care encounter   . Bipolar affective disorder, current episode depressed with psychotic symptoms ([place]) 09/17/2017  . Bipolar I disorder, most recent episode (or current) manic ([place]) 09/02/2017  . Acute delirium 09/02/2017  . Altered mental status   . Acute encephalopathy 09/01/2017  . ESRD on dialysis ([place]) 04/06/2017  . Complication of vascular access for dialysis 04/06/2017  . Hematuria 12/18/2016  . Polycystic kidney 10/12/2016  . Ruptured cyst of kidney 10/03/2016  . HTN (hypertension) 10/01/2016  . Chronic kidney disease 10/01/2016  . Anemia associated with chronic renal failure 09/24/2016  . Acute kidney insufficiency 07/28/2016  . Noninfectious diarrhea   . Benign neoplasm of cecum   . Benign neoplasm of ascending colon   . Diarrhea   . Nausea   . Gastric polyp   . Chronic constipation 12/27/2014  . Nausea with vomiting 12/27/2014  . Left sided abdominal pain 12/27/2014  . Personal history of colonic polyps 05/10/2013    Past Surgical History:  Procedure Laterality Date  . A/V FISTULAGRAM Left 04/14/2017   Procedure: A/V Fistulagram;  Surgeon: Katha Cabal, MD;  Location: [facility];  Service: Cardiovascular;  Laterality: Left;  . A/V FISTULAGRAM Left 06/09/2017   Procedure: A/V FISTULAGRAM;  Surgeon: Katha Cabal, MD;  Location: [facility]  INVASIVE CV LAB;  Service: Cardiovascular;  Laterality: Left;  . A/V FISTULAGRAM Left 11/02/2017   Procedure: A/V FISTULAGRAM;  Surgeon: Algernon Huxley, MD;  Location: [place] [facility];  Service: Cardiovascular;  Laterality: Left;  . A/V FISTULAGRAM Left 12/22/2018   Procedure: A/V FISTULAGRAM;  Surgeon: Katha Cabal, MD;  Location: [place] [facility];  Service: Cardiovascular;  Laterality: Left;   . A/V SHUNT INTERVENTION N/A 11/02/2017   Procedure: A/V SHUNT INTERVENTION;  Surgeon: Algernon Huxley, MD;  Location: [facility];  Service: Cardiovascular;  Laterality: N/A;  . ABDOMINAL HYSTERECTOMY  1990 ?  . AV FISTULA PLACEMENT  4/30  . BREAST EXCISIONAL BIOPSY Left 1994   neg surgical bx  . CHOLECYSTECTOMY  2003  . COLONOSCOPY  2014   Dr. Jamal Collin  . COLONOSCOPY WITH PROPOFOL N/A 09/24/2015   Procedure: COLONOSCOPY WITH random colon byopies.;  Surgeon: Lucilla Lame, MD;  Location: [facility];  Service: Endoscopy;  Laterality: N/A;  . ESOPHAGOGASTRODUODENOSCOPY (EGD) WITH PROPOFOL N/A 09/24/2015   Procedure: ESOPHAGOGASTRODUODENOSCOPY (EGD) ;  Surgeon: Lucilla Lame, MD;  Location: [facility];  Service: Endoscopy;  Laterality: N/A;  . EYE SURGERY    . PERIPHERAL VASCULAR CATHETERIZATION N/A 05/29/2015   Procedure: A/V Shuntogram/Fistulagram;  Surgeon: Katha Cabal, MD;  Location: [place] [facility];  Service: Cardiovascular;  Laterality: N/A;  . PERIPHERAL VASCULAR CATHETERIZATION N/A 05/29/2015   Procedure: A/V Shunt Intervention;  Surgeon: Katha Cabal, MD;  Location: [place];  Service: Cardiovascular;  Laterality: N/A;  . POLYPECTOMY  09/24/2015   Procedure: POLYPECTOMY INTESTINAL;  Surgeon: Lucilla Lame, MD;  Location: [facility];  Service: Endoscopy;;  cecal polyp ascending polyp    Prior to Admission medications   Medication Sig Start Date End Date Taking? Authorizing Provider  acetaminophen (TYLENOL) 325 MG tablet take 2 tablet by oral route  as needed 02/09/18   [provider]  brimonidine (ALPHAGAN) 0.2 % ophthalmic solution Place 1 drop into both eyes 2 (two) times daily. 10/22/17   Clapacs, [name], MD  calcium acetate (PHOSLO) 667 MG capsule  01/04/19   [provider]  carbamazepine (TEGRETOL) 100 MG chewable tablet Chew 100 mg by mouth 2 (two) times daily.    [provider]  cholecalciferol (VITAMIN  D3) 25 MCG (1000 UT) tablet take a 1000 units by mouth daily 02/09/18   [provider]  dicyclomine (BENTYL) 20 MG tablet Take 1 tablet (20 mg total) by mouth 3 (three) times daily before meals. Patient taking differently: Take 20 mg by mouth 3 (three) times daily after meals.  03/18/18   [name], Herma Ard B, MD  epoetin alfa (EPOGEN) 4000 UNIT/ML injection inject 1 mL (4000) units into the vein, Mon, Wed, Fri. 02/09/18   [provider]  famotidine (PEPCID) 20 MG tablet Take 1 tablet (20 mg total) by mouth daily. 03/18/18   Orson Slick B, MD  haloperidol (HALDOL) 5 MG tablet Take 2.5 mg by mouth every Monday, Wednesday, and Friday.    [provider]  ipratropium (ATROVENT) 0.03 % nasal spray Place 2 sprays into both nostrils 2 (two) times daily as needed for rhinitis.    [provider]  ipratropium (ATROVENT) 0.06 % nasal spray  10/14/18   [provider]  latanoprost (XALATAN) 0.005 % ophthalmic solution  02/16/19   [provider]  lidocaine-prilocaine (EMLA) cream Apply 1 application topically Every Tuesday,Thursday,and Saturday with dialysis. At dialysis  05/06/17  [provider]  metoprolol tartrate (LOPRESSOR) 25 MG tablet Take 1 tablet (25 mg total) by mouth 2 (two) times daily. Patient taking differently: Take 25 mg by mouth See admin instructions. Take 1 tablet by mouth twice daily, HOLD Morning dose on Dialysis days 03/18/18   [name], [name], MD  midodrine (PROAMATINE) 10 MG tablet Take 10 mg by mouth See admin instructions. Daily prn as needed on Dialysis days for SBP < 100    [provider]  multivitamin (RENA-VIT) TABS tablet TAKE 1 TABLET BY MOUTH EVERYDAY AT BEDTIME Patient taking differently: Take 1 tablet by mouth daily.  10/27/17   Clapacs, [name], MD  ondansetron (ZOFRAN) 4 MG tablet  02/25/19   [provider]  pantoprazole (PROTONIX) 40 MG tablet Take by mouth. 02/09/18   [provider]  rOPINIRole (REQUIP) 2 MG tablet Take 1 tablet (2 mg total) by mouth daily. 03/18/18   [name], Herma Ard B, MD  timolol (BETIMOL) 0.5 % ophthalmic solution Apply to eye. 02/09/18   [provider]  timolol (TIMOPTIC) 0.5 % ophthalmic solution Place 1 drop into both eyes 2 (two) times daily. 10/22/17   Clapacs, [name], MD  traMADol (ULTRAM) 50 MG tablet Take 1 tablet (50 mg total) by mouth every 6 (six) hours as needed. 05/20/19 05/19/20  Earleen Newport, MD  traZODone (DESYREL) 50 MG tablet Take 25 mg by mouth at bedtime.    [provider]  vitamin C (ASCORBIC ACID) 250 MG tablet Take 250 mg by mouth 2 (two) times daily.    [provider]  ziprasidone (GEODON) 20 MG capsule Take 20 mg by mouth at bedtime.    [provider]    Allergies Morphine and related, Gabapentin, Indomethacin, and Pollen extract  Family History  Problem Relation Age of Onset  . Stroke Father   . Hypertension Father   . Breast cancer Other   . Colon cancer Neg Hx   . Liver disease Neg Hx   . Mental illness Neg Hx     Social History Social History   Tobacco Use  . Smoking status: Never Smoker  . Smokeless tobacco: Never Used  Substance Use Topics  . Alcohol use: No    Alcohol/week: 0.0 standard drinks  . Drug use: No    Review of Systems Constitutional: No fever/chills Eyes: No visual changes. ENT: No sore throat. Cardiovascular: Denies chest pain. Respiratory: Denies shortness of breath. Gastrointestinal: No abdominal pain.  No nausea, no vomiting.  No diarrhea.  No constipation.  Positive for left flank pain Genitourinary: Negative for dysuria.  Positive for hematuria Musculoskeletal: Negative for neck pain.  Negative for back pain. Integumentary: Negative for rash. Neurological: Negative for headaches, focal weakness or numbness.   ____________________________________________   PHYSICAL EXAM:  VITAL SIGNS: ED Triage Vitals  Enc  Vitals Group     BP 05/21/19 2217 (!) 145/52     Pulse Rate 05/21/19 2217 (!) 57     Resp 05/21/19 2217 18     Temp 05/21/19 2217 98.9 F (37.2 C)     Temp Source 05/21/19 2217 Oral     SpO2 05/21/19 2211 98 %     Weight 05/21/19 2218 69.4 kg (153 lb)     Height 05/21/19 2218 1.6 m (5\' 3" )     Head Circumference --      Peak Flow --      Pain Score 05/21/19 2218 7     Pain Loc --  Pain Edu? --      Excl. in [place]? --     Constitutional: Alert and oriented.  Eyes: Conjunctivae are normal.  Head: Atraumatic. Mouth/Throat: Patient is wearing a mask. Neck: No stridor.  No meningeal signs.   Cardiovascular: Normal rate, regular rhythm. Good peripheral circulation. Grossly normal heart sounds. Respiratory: Normal respiratory effort.  No retractions. Gastrointestinal: Soft and nontender. No distention.  Musculoskeletal: No lower extremity tenderness nor edema. No gross deformities of extremities. Neurologic:  Normal speech and language. No gross focal neurologic deficits are appreciated.  Skin: Left flank contusion noted. Psychiatric: Mood and affect are normal. Speech and behavior are normal.  ____________________________________________   LABS (all labs ordered are listed, but only abnormal results are displayed)  Labs Reviewed  URINALYSIS, COMPLETE (UACMP) WITH MICROSCOPIC - Abnormal; Notable for the following components:      Result Value   Color, Urine AMBER (*)    APPearance CLOUDY (*)    Hgb urine dipstick LARGE (*)    Protein, ur 100 (*)    Leukocytes,Ua MODERATE (*)    RBC / HPF >50 (*)    WBC, UA >50 (*)    Bacteria, UA RARE (*)    All other components within normal limits  CBC - Abnormal; Notable for the following components:   RBC 2.85 (*)    Hemoglobin 9.4 (*)    HCT 28.4 (*)    All other components within normal limits  BASIC METABOLIC PANEL - Abnormal; Notable for the following components:   Chloride 97 (*)    Glucose, Bld 100 (*)    BUN 33 (*)     Creatinine, Ser 5.29 (*)    GFR calc non Af Amer 8 (*)    GFR calc Af Amer 9 (*)    All other components within normal limits  HEMOGLOBIN AND HEMATOCRIT, BLOOD - Abnormal; Notable for the following components:   Hemoglobin 9.3 (*)    HCT 28.3 (*)    All other components within normal limits  SARS CORONAVIRUS 2 (TAT 6-24 HRS)   _____________________________________ RADIOLOGY I, [place], personally viewed and evaluated these images (plain radiographs) as part of my medical decision making, as well as reviewing the written report by the radiologist.  ED MD interpretation: Numerous bilateral simple complex renal cyst noted including 2 new hyperdense cyst likely hemorrhagic per radiologist on CT interpretation.  Official radiology report(s): Ct Renal Stone Study  Result Date: 05/22/2019 CLINICAL DATA:  Hematuria EXAM: CT ABDOMEN AND PELVIS WITHOUT CONTRAST TECHNIQUE: Multidetector CT imaging of the abdomen and pelvis was performed following the standard protocol without IV contrast. COMPARISON:  03/11/2017 FINDINGS: LOWER CHEST: Moderate cardiomegaly. HEPATOBILIARY: Normal hepatic contours. There is no intra- or extrahepatic biliary dilatation. Status post cholecystectomy. PANCREAS: Normal pancreatic contours without pancreatic ductal dilatation or peripancreatic fluid collection. SPLEEN: Normal. ADRENALS/URINARY TRACT: --Adrenal glands: Normal. --Right kidney/ureter: Numerous cyst with diffuse renal atrophy. No hydronephrosis. The right ureter is unobstructed. --Left kidney/ureter: Numerous renal cysts including 2 hyperdense cysts measuring up 2 2.1 cm. --Urinary bladder: Normal for degree of distention STOMACH/BOWEL: --Stomach/Duodenum: There is no hiatal hernia. The duodenal course and caliber are normal. --Small bowel: No dilatation or inflammation. --Colon: Multifocal colonic diverticulosis without acute inflammation. --Appendix: Not visualized. No right lower quadrant inflammation or  free fluid. VASCULAR/LYMPHATIC: There is calcific atherosclerosis of the abdominal aorta. No abdominal or pelvic lymphadenopathy. REPRODUCTIVE: Status post hysterectomy. There is a left adnexal cyst that measures 4.2 x 3.5 cm, slightly increased in  size. MUSCULOSKELETAL. No bony spinal canal stenosis or focal osseous abnormality. OTHER: None. IMPRESSION: 1. No acute abdominal or pelvic abnormality. 2. Numerous bilateral simple and complex renal cysts, including 2 new hyperdense cysts, likely hemorrhagic, near the left upper pole. 3. Slight increase in size of left adnexal cyst, now measuring 4.2 x 3.5 cm. 4. Aortic Atherosclerosis (ICD10-I70.0). Electronically Signed   By: Ulyses Jarred M.D.   On: 05/22/2019 04:20     Procedures   ____________________________________________   INITIAL IMPRESSION / MDM / [place] / ED COURSE  As part of my medical decision making, I reviewed the following data within the electronic MEDICAL RECORD NUMBER  73 year old female presented with above-stated history and physical exam concerning for possible hemorrhagic cyst with resultant hematuria.  CT scan performed to confirm suspicion of hemorrhagic cyst.  Patient's hemoglobin yesterday was 10.1 today 9.3.  Patient discussed with Dr. Candiss Norse nephrology who recommended admission for observation for pain control and serial H&H.  Patient subsequently discussed with the Dr. Posey Pronto for admission.  ____________________________________________  FINAL CLINICAL IMPRESSION(S) / ED DIAGNOSES  Final diagnoses:  Renal cyst, native, hemorrhage     MEDICATIONS GIVEN DURING THIS VISIT:  Medications  fentaNYL (SUBLIMAZE) injection 25 mcg (25 mcg Intravenous Given 05/22/19 0412)     ED Discharge Orders    None      *Please note:  Joyce [name] was evaluated in Emergency Department on 05/22/2019 for the symptoms described in the history of present illness. She was evaluated in the context of the global COVID-19  pandemic, which necessitated consideration that the patient might be at risk for infection with the SARS-CoV-2 virus that causes COVID-19. Institutional protocols and algorithms that pertain to the evaluation of patients at risk for COVID-19 are in a state of rapid change based on information released by regulatory bodies including the CDC and federal and state organizations. These policies and algorithms were followed during the patient's care in the ED.  Some ED evaluations and interventions may be delayed as a result of limited staffing during the pandemic.*  Note:  This document was prepared using Dragon voice recognition software and may include unintentional dictation errors.   Gregor Hams, MD 05/22/19 [id number]

## 2019-05-22 NOTE — ED Notes (Signed)
Pt resting in bed. Call bell in reach. Will continue to assess.

## 2019-05-22 NOTE — H&P (Signed)
[facility] History and Physical   Patient: Joyce [name] NID:[id number]   PCP: [facility] DOB: [birth year]   DOA: 05/22/2019   DOS: 05/22/2019   DOS: the patient was seen and examined on 05/22/2019  Patient coming from: The patient is coming from Home  Chief Complaint: Left flank pain  HPI: Joyce [name] is a 73 y.o. female with Past medical history of ESRD on HD TTS, polycystic kidney disease, bipolar disorder, GERD, IBS, HTN. Patient presents with complaints of left flank pain. Patient was at hemodialysis center [facility] yesterday when she had a fall after having a bowel movement.  She does not remember how she fell down she denies having any head injury or neck injury she does not remember having any prodrome but she remembers she found herself on the floor. After that fall she was evaluated by her nephrologist there and patient underwent hemodialysis session there but continues to have left flank pain.  The pain progressively worsened at home.  Was not controlled with tramadol which patient was prescribed after the fall in the dialysis center and therefore she decided to come to the emergency department today. Patient also reports having some blood in the urine.  Reports that she had similar blood in the urine when she had bleeding in her renal cyst.  She had an episode of vomiting yesterday but no nausea.  She reports ongoing alternating diarrhea and constipation for last 6 months.  She also reports 20 pound weight loss in last 1 year secondary to poor p.o. intake. She denies any chest pain any focal deficit any shortness of breath any fever any chills.  ED Course: Presents with above complaint.  CT scan renal protocol shows evidence of hemorrhage in her polycystic kidney disease on left.  Hemoglobin low from her baseline of 10.1-9.4.  Patient was referred for admission.  At her baseline ambulates with assistance independent for most of her ADL;  manages her  medication on her own.  Review of Systems: as mentioned in the history of present illness.  All other systems reviewed and are negative.  Past Medical History:  Diagnosis Date   Anal fissure    Bipolar affective disorder ([place])    CKD (chronic kidney disease)    Dr Holley Raring [name] 4   Colon polyps    Diverticulitis    Diverticulitis    Family history of adverse reaction to anesthesia    mom - PONV   GERD (gastroesophageal reflux disease)    Headache    migraines - none over 10 yrs   Heart murmur    History of hiatal hernia    Hypertension    Pancreatitis    Valproic acid   Vertigo    Past Surgical History:  Procedure Laterality Date   A/V FISTULAGRAM Left 04/14/2017   Procedure: A/V Fistulagram;  Surgeon: Katha Cabal, MD;  Location: [facility];  Service: Cardiovascular;  Laterality: Left;   A/V FISTULAGRAM Left 06/09/2017   Procedure: A/V FISTULAGRAM;  Surgeon: Katha Cabal, MD;  Location: [facility];  Service: Cardiovascular;  Laterality: Left;   A/V FISTULAGRAM Left 11/02/2017   Procedure: A/V FISTULAGRAM;  Surgeon: Algernon Huxley, MD;  Location: [facility];  Service: Cardiovascular;  Laterality: Left;   A/V FISTULAGRAM Left 12/22/2018   Procedure: A/V FISTULAGRAM;  Surgeon: Katha Cabal, MD;  Location: [facility];  Service: Cardiovascular;  Laterality: Left;   A/V SHUNT INTERVENTION N/A 11/02/2017  Procedure: A/V SHUNT INTERVENTION;  Surgeon: Algernon Huxley, MD;  Location: [facility];  Service: Cardiovascular;  Laterality: N/A;   ABDOMINAL HYSTERECTOMY  1990 ?   AV FISTULA PLACEMENT  4/30   BREAST EXCISIONAL BIOPSY Left 1994   neg surgical bx   CHOLECYSTECTOMY  2003   COLONOSCOPY  2014   Dr. Jamal Collin   COLONOSCOPY WITH PROPOFOL N/A 09/24/2015   Procedure: COLONOSCOPY WITH random colon byopies.;  Surgeon: Lucilla Lame, MD;  Location: [facility];  Service: Endoscopy;  Laterality: N/A;    ESOPHAGOGASTRODUODENOSCOPY (EGD) WITH PROPOFOL N/A 09/24/2015   Procedure: ESOPHAGOGASTRODUODENOSCOPY (EGD) ;  Surgeon: Lucilla Lame, MD;  Location: [facility];  Service: Endoscopy;  Laterality: N/A;   EYE SURGERY     PERIPHERAL VASCULAR CATHETERIZATION N/A 05/29/2015   Procedure: A/V Shuntogram/Fistulagram;  Surgeon: Katha Cabal, MD;  Location: [facility];  Service: Cardiovascular;  Laterality: N/A;   PERIPHERAL VASCULAR CATHETERIZATION N/A 05/29/2015   Procedure: A/V Shunt Intervention;  Surgeon: Katha Cabal, MD;  Location: [facility];  Service: Cardiovascular;  Laterality: N/A;   POLYPECTOMY  09/24/2015   Procedure: POLYPECTOMY INTESTINAL;  Surgeon: Lucilla Lame, MD;  Location: [facility];  Service: Endoscopy;;  cecal polyp ascending polyp   Social History:  reports that she has never smoked. She has never used smokeless tobacco. She reports that she does not drink alcohol or use drugs.  Allergies  Allergen Reactions   Morphine And Related Shortness Of Breath    Pt reports chest pain and difficulty breathing.   Gabapentin    Indomethacin Hives   Pollen Extract Other (See Comments)    Sinus problems and HA   Family history reviewed and not pertinent Family History  Problem Relation Age of Onset   Stroke Father    Hypertension Father    Breast cancer Other    Colon cancer Neg Hx    Liver disease Neg Hx    Mental illness Neg Hx      Prior to Admission medications   Medication Sig Start Date End Date Taking? Authorizing Provider  acetaminophen (TYLENOL) 325 MG tablet take 2 tablet by oral route  as needed 02/09/18   [provider]  brimonidine (ALPHAGAN) 0.2 % ophthalmic solution Place 1 drop into both eyes 2 (two) times daily. 10/22/17   Clapacs, [name], MD  calcium acetate (PHOSLO) 667 MG capsule  01/04/19   [provider]  carbamazepine (TEGRETOL) 100 MG chewable tablet Chew 100 mg by mouth 2 (two)  times daily.    [provider]  cholecalciferol (VITAMIN D3) 25 MCG (1000 UT) tablet take a 1000 units by mouth daily 02/09/18   [provider]  dicyclomine (BENTYL) 20 MG tablet Take 1 tablet (20 mg total) by mouth 3 (three) times daily before meals. Patient taking differently: Take 20 mg by mouth 3 (three) times daily after meals.  03/18/18   [name], Herma Ard B, MD  epoetin alfa (EPOGEN) 4000 UNIT/ML injection inject 1 mL (4000) units into the vein, Mon, Wed, Fri. 02/09/18   [provider]  famotidine (PEPCID) 20 MG tablet Take 1 tablet (20 mg total) by mouth daily. 03/18/18   Orson Slick B, MD  haloperidol (HALDOL) 5 MG tablet Take 2.5 mg by mouth every Monday, Wednesday, and Friday.    [provider]  ipratropium (ATROVENT) 0.03 % nasal spray Place 2 sprays into both nostrils 2 (two) times daily as needed for rhinitis.  [provider]  ipratropium (ATROVENT) 0.06 % nasal spray  10/14/18   [provider]  latanoprost (XALATAN) 0.005 % ophthalmic solution  02/16/19   [provider]  lidocaine-prilocaine (EMLA) cream Apply 1 application topically Every Tuesday,Thursday,and Saturday with dialysis. At dialysis  05/06/17   [provider]  metoprolol tartrate (LOPRESSOR) 25 MG tablet Take 1 tablet (25 mg total) by mouth 2 (two) times daily. Patient taking differently: Take 25 mg by mouth See admin instructions. Take 1 tablet by mouth twice daily, HOLD Morning dose on Dialysis days 03/18/18   [name], [name], MD  midodrine (PROAMATINE) 10 MG tablet Take 10 mg by mouth See admin instructions. Daily prn as needed on Dialysis days for SBP < 100    [provider]  multivitamin (RENA-VIT) TABS tablet TAKE 1 TABLET BY MOUTH EVERYDAY AT BEDTIME Patient taking differently: Take 1 tablet by mouth daily.  10/27/17   Clapacs, [name], MD  ondansetron (ZOFRAN) 4 MG tablet  02/25/19   [provider]    pantoprazole (PROTONIX) 40 MG tablet Take by mouth. 02/09/18   [provider]  rOPINIRole (REQUIP) 2 MG tablet Take 1 tablet (2 mg total) by mouth daily. 03/18/18   [name], Herma Ard B, MD  timolol (BETIMOL) 0.5 % ophthalmic solution Apply to eye. 02/09/18   [provider]  timolol (TIMOPTIC) 0.5 % ophthalmic solution Place 1 drop into both eyes 2 (two) times daily. 10/22/17   Clapacs, [name], MD  traMADol (ULTRAM) 50 MG tablet Take 1 tablet (50 mg total) by mouth every 6 (six) hours as needed. 05/20/19 05/19/20  Earleen Newport, MD  traZODone (DESYREL) 50 MG tablet Take 25 mg by mouth at bedtime.    [provider]  vitamin C (ASCORBIC ACID) 250 MG tablet Take 250 mg by mouth 2 (two) times daily.    [provider]  ziprasidone (GEODON) 20 MG capsule Take 20 mg by mouth at bedtime.    [provider]    Physical Exam: Vitals:   05/21/19 2211 05/21/19 2217 05/21/19 2218  BP:  (!) 145/52   Pulse:  (!) 57   Resp:  18   Temp:  98.9 F (37.2 C)   TempSrc:  Oral   SpO2: 98% 96%   Weight:   69.4 kg  Height:   5\' 3"  (1.6 m)    General: alert and oriented to time, place, and person. Appear in moderate distress, affect appropriate Eyes: PERRL, Conjunctiva normal ENT: Oral Mucosa Clear, moist  Neck: no JVD, no Abnormal Mass Or lumps Cardiovascular: S1 and S2 Present, aortic systolic  Murmur, peripheral pulses symmetrical Respiratory: good respiratory effort, Bilateral Air entry equal and Decreased, no signs of accessory muscle use, Clear to Auscultation, no Crackles, no wheezes Abdomen: Bowel Sound present, Soft and mild tenderness, no hernia Skin: no rashes  Extremities: no Pedal edema, no calf tenderness Neurologic: without any new focal findings Gait not checked due to patient safety concerns  Data Reviewed: I have personally reviewed and interpreted labs, imaging as discussed below.  CBC: Recent Labs  Lab 05/20/19 1131  05/21/19 2222 05/22/19 0359  WBC 8.9 8.5  --   NEUTROABS 7.4  --   --   HGB 10.1* 9.4* 9.3*  HCT 29.9* 28.4* 28.3*  MCV 97.4 99.6  --   PLT 210 215  --    Basic Metabolic Panel: Recent Labs  Lab 05/20/19 1131 05/22/19 0359  NA 136 135  K 3.5  4.3  CL 99 97*  CO2 28 24  GLUCOSE 114* 100*  BUN 11 33*  CREATININE 2.05* 5.29*  CALCIUM 8.9 9.0   GFR: Estimated Creatinine Clearance: 9 mL/min (A) (by C-G formula based on SCr of 5.29 mg/dL (H)). Liver Function Tests: Recent Labs  Lab 05/20/19 1131  AST 17  ALT 14  ALKPHOS 77  BILITOT 0.8  PROT 6.7  ALBUMIN 4.0   No results for input(s): LIPASE, AMYLASE in the last 168 hours. No results for input(s): AMMONIA in the last 168 hours. Coagulation Profile: No results for input(s): INR, PROTIME in the last 168 hours. Cardiac Enzymes: No results for input(s): CKTOTAL, CKMB, CKMBINDEX, TROPONINI in the last 168 hours. BNP (last 3 results) No results for input(s): PROBNP in the last 8760 hours. HbA1C: No results for input(s): HGBA1C in the last 72 hours. CBG: No results for input(s): GLUCAP in the last 168 hours. Lipid Profile: No results for input(s): CHOL, HDL, LDLCALC, TRIG, CHOLHDL, LDLDIRECT in the last 72 hours. Thyroid Function Tests: No results for input(s): TSH, T4TOTAL, FREET4, T3FREE, THYROIDAB in the last 72 hours. Anemia Panel: No results for input(s): VITAMINB12, FOLATE, FERRITIN, TIBC, IRON, RETICCTPCT in the last 72 hours. Urine analysis:    Component Value Date/Time   COLORURINE AMBER (A) 05/21/2019 2221   APPEARANCEUR CLOUDY (A) 05/21/2019 2221   APPEARANCEUR Clear 05/10/2014 1344   LABSPEC 1.005 05/21/2019 2221   LABSPEC 1.009 05/10/2014 1344   PHURINE 8.0 05/21/2019 [place] 05/21/2019 2221   GLUCOSEU Negative 05/10/2014 1344   HGBUR LARGE (A) 05/21/2019 [place] 05/21/2019 2221   BILIRUBINUR Negative 05/10/2014 [place] 05/21/2019 2221    PROTEINUR 100 (A) 05/21/2019 2221   NITRITE NEGATIVE 05/21/2019 2221   LEUKOCYTESUR MODERATE (A) 05/21/2019 2221   LEUKOCYTESUR Negative 05/10/2014 1344    Radiological Exams on Admission: Dg Ribs Unilateral W/chest Left  Result Date: 05/20/2019 CLINICAL DATA:  Pain following fall EXAM: LEFT RIBS AND CHEST - 3+ VIEW COMPARISON:  Chest radiograph March 07, 2018 FINDINGS: Frontal chest as well as oblique and cone-down rib images were obtained. There is no edema or consolidation. Heart is mildly enlarged with pulmonary vascularity normal. No adenopathy. There is a stent involving the left subclavian and axillary regions. There is aortic atherosclerosis. No evident rib fracture.  No pneumothorax or pleural effusion. IMPRESSION: No evident rib fracture. No edema or consolidation. Cardiomegaly. Aortic Atherosclerosis (ICD10-I70.0). Electronically Signed   By: Lowella Grip III M.D.   On: 05/20/2019 12:26   Ct Head Wo Contrast  Result Date: 05/20/2019 CLINICAL DATA:  73 year old female with history of head trauma. Syncopal episode complicated by a fall. EXAM: CT HEAD WITHOUT CONTRAST TECHNIQUE: Contiguous axial images were obtained from the base of the skull through the vertex without intravenous contrast. COMPARISON:  None. FINDINGS: Brain: Patchy areas of decreased attenuation are noted throughout the deep and periventricular white matter of the cerebral hemispheres bilaterally, compatible with mild chronic microvascular ischemic disease. No evidence of acute infarction, hemorrhage, hydrocephalus, extra-axial collection or mass lesion/mass effect. Vascular: No hyperdense vessel or unexpected calcification. Skull: Normal. Negative for fracture or focal lesion. Sinuses/Orbits: No acute finding. Other: Small left mastoid effusion. IMPRESSION: 1. No acute intracranial abnormalities. 2. Small left mastoid effusion. 3. Mild chronic microvascular ischemic changes in the cerebral white matter, as above.  Electronically Signed   By: Vinnie Langton M.D.   On: 05/20/2019 12:03   US Renal  Result  Date: 05/20/2019 CLINICAL DATA:  Polycystic kidney disease, acute left flank pain, dialysis dependent EXAM: RENAL / URINARY TRACT ULTRASOUND COMPLETE COMPARISON:  03/11/2017 FINDINGS: Right Kidney: Renal measurements: 11.4 x 6.2 x 6.0 cm = volume: 217 mL. Increased cortical echogenicity with innumerable anechoic and hypoechoic renal cysts compatible with polycystic kidney disease. No definite hydronephrosis. Left Kidney: Renal measurements: 12.3 x 6.5 x 5.9 cm = volume: 247 mL. Similar increased renal echogenicity with innumerable hypoechoic and anechoic renal cysts throughout. No definite hydronephrosis. Bladder: Appears normal for degree of bladder distention. Other: None. IMPRESSION: Chronic polycystic kidney disease without definite hydronephrosis by ultrasound. No other acute finding. Electronically Signed   By: Jerilynn Mages.  Shick M.D.   On: 05/20/2019 13:54   Ct Renal Stone Study  Result Date: 05/22/2019 CLINICAL DATA:  Hematuria EXAM: CT ABDOMEN AND PELVIS WITHOUT CONTRAST TECHNIQUE: Multidetector CT imaging of the abdomen and pelvis was performed following the standard protocol without IV contrast. COMPARISON:  03/11/2017 FINDINGS: LOWER CHEST: Moderate cardiomegaly. HEPATOBILIARY: Normal hepatic contours. There is no intra- or extrahepatic biliary dilatation. Status post cholecystectomy. PANCREAS: Normal pancreatic contours without pancreatic ductal dilatation or peripancreatic fluid collection. SPLEEN: Normal. ADRENALS/URINARY TRACT: --Adrenal glands: Normal. --Right kidney/ureter: Numerous cyst with diffuse renal atrophy. No hydronephrosis. The right ureter is unobstructed. --Left kidney/ureter: Numerous renal cysts including 2 hyperdense cysts measuring up 2 2.1 cm. --Urinary bladder: Normal for degree of distention STOMACH/BOWEL: --Stomach/Duodenum: There is no hiatal hernia. The duodenal course and caliber are  normal. --Small bowel: No dilatation or inflammation. --Colon: Multifocal colonic diverticulosis without acute inflammation. --Appendix: Not visualized. No right lower quadrant inflammation or free fluid. VASCULAR/LYMPHATIC: There is calcific atherosclerosis of the abdominal aorta. No abdominal or pelvic lymphadenopathy. REPRODUCTIVE: Status post hysterectomy. There is a left adnexal cyst that measures 4.2 x 3.5 cm, slightly increased in size. MUSCULOSKELETAL. No bony spinal canal stenosis or focal osseous abnormality. OTHER: None. IMPRESSION: 1. No acute abdominal or pelvic abnormality. 2. Numerous bilateral simple and complex renal cysts, including 2 new hyperdense cysts, likely hemorrhagic, near the left upper pole. 3. Slight increase in size of left adnexal cyst, now measuring 4.2 x 3.5 cm. 4. Aortic Atherosclerosis (ICD10-I70.0). Electronically Signed   By: Ulyses Jarred M.D.   On: 05/22/2019 04:20   Echocardiogram: ORDERED ON 05/5000/2020  I reviewed all nursing notes, pharmacy notes, vitals, pertinent old records.  Assessment/Plan 1. Renal cyst, native, hemorrhage hematuria Polycystic kidney disease with hemorrhage in the cyst. H&H relatively low. We will recheck at 2 PM. If the H&H is relatively stable patient probably can be discharged home after pain control is achieved. No nausea no vomiting at the time of my evaluation. Nephrology was consulted via phone by EDP but at present no formal consultation.  2.  ESRD on HD TTS. Continue home regimen for now. Will require nephrology consultation if the patient ends up staying tonight.  3.  Anemia of chronic disease. Continue to monitor H&H for now.  4.  Syncopal event. Etiology not clear. Patient was actually seen in the ED on 05/20/2019 after this event. EKG was unremarkable. We will continue to monitor on telemetry. Check echocardiogram. No focal deficit the time of my evaluation. Likely vasovagal in etiology.  5.  Bipolar  disorder. We will continue with home regimen.  6.  HTN. Hypotension with dialysis. Currently holding her home scheduled metoprolol and continuing only as needed midodrine.  7.  Restless leg syndrome. Continue Requip.  8.  Unintentional weight loss. Irritable bowel syndrome. Alternating diarrhea  and constipation. Continue home regimen. Recommend outpatient GI follow-up on discharge.  Nutrition: Renal diet DVT Prophylaxis: SCD, pharmacological prophylaxis contraindicated due to renal cyst hemorrhage  Advance goals of care discussion: Full code   Consults: none, need nephro if staying beyond 05/22/2019  Family Communication: no family was present at bedside, at the time of interview.  Disposition: Admitted as observation, med-surge unit. Likely to be discharged home, in 1-2 days.  I have discussed plan of care as described above with RN and patient/family.  Author: Berle Mull, MD [facility] 05/22/2019 6:00 AM   To reach On-call, see care teams to locate the attending and reach out to them via [email or web address]. If 7PM-7AM, please contact night-coverage If you still have difficulty reaching the attending provider, please page the [facility] (Director on Call) for [facility] on amion for assistance.

## 2019-05-22 NOTE — Progress Notes (Signed)
*  PRELIMINARY RESULTS* Echocardiogram 2D Echocardiogram has been performed.  Joyce [name] 05/22/2019, 8:53 AM

## 2019-05-22 NOTE — ED Notes (Signed)
Admit Provider at bedside. 

## 2019-05-22 NOTE — Progress Notes (Signed)
Patient admitted this morning by Dr Posey Pronto. She has history of ESRD, anemia of chronic disease, polycystic kidney disease, HTN, bipolar disorder initially had syncopal episode at her dialysis center and then started having left-sided flank pain.  In the ER she was found to have hemorrhagic renal cyst with some hematuria. Patient seen and examined in the ER this morning she is complaining of left flank pain but overall feels okay.  Denies any dizziness, chest pain, headache, lightheadedness.  Besides pain no other complaint.  Vital signs are stable.  On physical exam some CVA tenderness, overall clear to auscultation bilaterally, normal sinus rhythm.  At this time we will plan on repeating hemoglobin and periodically follow this.  If we can make sure her H&H remained stable and pain is controlled she can be discharged home and follow-up outpatient. For syncopal event we will order echocardiogram and gently hydrate if necessary.  Continue to provide supportive care.  UA concerns for urinary tract infection with hematuria-we will send urine culture and start on Rocephin.  Call with any questions as needed.  Time spent: 25 mins

## 2019-05-22 NOTE — ED Notes (Signed)
Pt ambulatory to BR with no assist.

## 2019-05-22 NOTE — Progress Notes (Signed)
[facility] @ [place], [place] 05/22/19  Subjective:   LOS: 0 No intake/output data recorded.  Last HD was on October 30.  Patient was admitted for abdominal pain after fall at home.  She said she blacked out in the bathroom and then slid down.  After that she started having left flank pain and hematuria.CT of the abdomen shows hemorrhagic cyst in the left upper pole kidney.  Patient was admitted for pain control and further management. This morning she still complains of hematuria and discomfort and pain in the left flank. Hemoglobin has dropped from 9.4 at admission to 8.9 today Able to eat without nausea or vomiting Denies shortness of breath   Objective:  Vital signs in last 24 hours:  Temp:  [98.4 F (36.9 C)-98.9 F (37.2 C)] 98.4 F (36.9 C) (11/01 0810) Pulse Rate:  [57-60] 60 (11/01 0810) Resp:  [18] 18 (11/01 0810) BP: (140-145)/(52-58) 140/58 (11/01 0810) SpO2:  [96 %-98 %] 96 % (11/01 0810) Weight:  [69.4 kg] 69.4 kg (10/31 2218)  Weight change:  Filed Weights   05/21/19 2218  Weight: 69.4 kg    Intake/Output:   No intake or output data in the 24 hours ending 05/22/19 1438   Physical Exam: General:  No acute distress, laying in the bed  HEENT  moist oral mucous membranes  Pulm/lungs  room air, clear to auscultation  CVS/Heart  regular, no rub   Abdomen:   Soft, left flank tenderness  Extremities:  No edema  Neurologic:  Alert, oriented  Skin:  No acute rashes  Access:  Left arm AV fistula       Basic Metabolic Panel:  Recent Labs  Lab 05/20/19 1131 05/22/19 0359  NA 136 135  K 3.5 4.3  CL 99 97*  CO2 28 24  GLUCOSE 114* 100*  BUN 11 33*  CREATININE 2.05* 5.29*  CALCIUM 8.9 9.0     CBC: Recent Labs  Lab 05/20/19 1131 05/21/19 2222 05/22/19 0359 05/22/19 0818  WBC 8.9 8.5  --   --   NEUTROABS 7.4  --   --   --   HGB 10.1* 9.4* 9.3* 8.9*  HCT 29.9* 28.4* 28.3* 26.9*  MCV 97.4 99.6  --   --   PLT 210 215  --    --       Lab Results  Component Value Date   HEPBSAG Negative 12/16/2017   HEPBSAB Non Reactive 09/18/2017      Microbiology:  No results found for this or any previous visit (from the past 240 hour(s)).  Coagulation Studies: No results for input(s): LABPROT, INR in the last 72 hours.  Urinalysis: Recent Labs    05/21/19 2221  COLORURINE AMBER*  LABSPEC 1.005  PHURINE 8.0  GLUCOSEU NEGATIVE  HGBUR LARGE*  BILIRUBINUR NEGATIVE  KETONESUR NEGATIVE  PROTEINUR 100*  NITRITE NEGATIVE  LEUKOCYTESUR MODERATE*      Imaging: Ct Renal Stone Study  Result Date: 05/22/2019 CLINICAL DATA:  Hematuria EXAM: CT ABDOMEN AND PELVIS WITHOUT CONTRAST TECHNIQUE: Multidetector CT imaging of the abdomen and pelvis was performed following the standard protocol without IV contrast. COMPARISON:  03/11/2017 FINDINGS: LOWER CHEST: Moderate cardiomegaly. HEPATOBILIARY: Normal hepatic contours. There is no intra- or extrahepatic biliary dilatation. Status post cholecystectomy. PANCREAS: Normal pancreatic contours without pancreatic ductal dilatation or peripancreatic fluid collection. SPLEEN: Normal. ADRENALS/URINARY TRACT: --Adrenal glands: Normal. --Right kidney/ureter: Numerous cyst with diffuse renal atrophy. No hydronephrosis. The right ureter is unobstructed. --Left kidney/ureter: Numerous renal cysts including  2 hyperdense cysts measuring up 2 2.1 cm. --Urinary bladder: Normal for degree of distention STOMACH/BOWEL: --Stomach/Duodenum: There is no hiatal hernia. The duodenal course and caliber are normal. --Small bowel: No dilatation or inflammation. --Colon: Multifocal colonic diverticulosis without acute inflammation. --Appendix: Not visualized. No right lower quadrant inflammation or free fluid. VASCULAR/LYMPHATIC: There is calcific atherosclerosis of the abdominal aorta. No abdominal or pelvic lymphadenopathy. REPRODUCTIVE: Status post hysterectomy. There is a left adnexal cyst that measures 4.2  x 3.5 cm, slightly increased in size. MUSCULOSKELETAL. No bony spinal canal stenosis or focal osseous abnormality. OTHER: None. IMPRESSION: 1. No acute abdominal or pelvic abnormality. 2. Numerous bilateral simple and complex renal cysts, including 2 new hyperdense cysts, likely hemorrhagic, near the left upper pole. 3. Slight increase in size of left adnexal cyst, now measuring 4.2 x 3.5 cm. 4. Aortic Atherosclerosis (ICD10-I70.0). Electronically Signed   By: Ulyses Jarred M.D.   On: 05/22/2019 04:20     Medications:   . cefTRIAXone (ROCEPHIN)  IV Stopped (05/22/19 1134)   . brimonidine  1 drop Both Eyes BID  . carbamazepine  100 mg Oral BID  . dicyclomine  20 mg Oral TID AC  . famotidine  20 mg Oral Daily  . [START ON 05/23/2019] haloperidol  2.5 mg Oral Q M,W,F  . latanoprost  1 drop Both Eyes QHS  . multivitamin  1 tablet Oral Daily  . pantoprazole  40 mg Oral Daily  . rOPINIRole  2 mg Oral Daily  . timolol  1 drop Both Eyes BID  . traZODone  25 mg Oral QHS  . ziprasidone  20 mg Oral QHS   acetaminophen **OR** acetaminophen, ipratropium, ondansetron **OR** ondansetron (ZOFRAN) IV, oxyCODONE, traMADol  Assessment/ Plan:  73 y.o. female with end-stage renal disease on hemodialysis, polycystic kidney disease, history of bipolar disorder, admitted for hemorrhagic left cyst.  Principal Problem:   Renal cyst, native, hemorrhage Active Problems:   Left sided abdominal pain   Anemia associated with chronic renal failure   Polycystic kidney   Hematuria   ESRD on dialysis ([place])   Bipolar I disorder, most recent episode (or current) manic (H[facility])   RLS (restless legs syndrome)  [facility] MWF-1  TW 69kg left arm AV fistula  #. ESRD -We will arrange for hemodialysis tomorrow -Minimal ultrafiltration   #. Anemia of CKD  Lab Results  Component Value Date   HGB 8.9 (L) 05/22/2019   Low dose EPO with HD  #. SHPTH  No results found for: PTH Lab Results  Component Value  Date   PHOS 3.8 03/18/2018   Monitor calcium and phos level during this admission   #.  Hemorrhagic left kidney cyst -Pain control -Prophylactic antibiotic IV Rocephin can be switched to oral Cipro later -If bleeding is not controlled, may need vascular surgery evaluation for embolization.    LOS: 0 Joyce [name] 11/1/20202:38 PM  [facility] [place], [place]

## 2019-05-23 DIAGNOSIS — K219 Gastro-esophageal reflux disease without esophagitis: Secondary | ICD-10-CM | POA: Diagnosis not present

## 2019-05-23 DIAGNOSIS — N2889 Other specified disorders of kidney and ureter: Secondary | ICD-10-CM | POA: Diagnosis present

## 2019-05-23 DIAGNOSIS — N186 End stage renal disease: Secondary | ICD-10-CM | POA: Diagnosis not present

## 2019-05-23 DIAGNOSIS — Z915 Personal history of self-harm: Secondary | ICD-10-CM | POA: Diagnosis not present

## 2019-05-23 DIAGNOSIS — Z992 Dependence on renal dialysis: Secondary | ICD-10-CM | POA: Diagnosis not present

## 2019-05-23 DIAGNOSIS — K589 Irritable bowel syndrome without diarrhea: Secondary | ICD-10-CM | POA: Diagnosis not present

## 2019-05-23 DIAGNOSIS — I953 Hypotension of hemodialysis: Secondary | ICD-10-CM | POA: Diagnosis not present

## 2019-05-23 DIAGNOSIS — I120 Hypertensive chronic kidney disease with stage 5 chronic kidney disease or end stage renal disease: Secondary | ICD-10-CM | POA: Diagnosis not present

## 2019-05-23 DIAGNOSIS — R634 Abnormal weight loss: Secondary | ICD-10-CM | POA: Diagnosis not present

## 2019-05-23 DIAGNOSIS — D631 Anemia in chronic kidney disease: Secondary | ICD-10-CM | POA: Diagnosis not present

## 2019-05-23 DIAGNOSIS — Z888 Allergy status to other drugs, medicaments and biological substances status: Secondary | ICD-10-CM | POA: Diagnosis not present

## 2019-05-23 DIAGNOSIS — Z91048 Other nonmedicinal substance allergy status: Secondary | ICD-10-CM | POA: Diagnosis not present

## 2019-05-23 DIAGNOSIS — Z87440 Personal history of urinary (tract) infections: Secondary | ICD-10-CM | POA: Diagnosis not present

## 2019-05-23 DIAGNOSIS — R55 Syncope and collapse: Secondary | ICD-10-CM | POA: Diagnosis not present

## 2019-05-23 DIAGNOSIS — Z8249 Family history of ischemic heart disease and other diseases of the circulatory system: Secondary | ICD-10-CM | POA: Diagnosis not present

## 2019-05-23 DIAGNOSIS — Z20828 Contact with and (suspected) exposure to other viral communicable diseases: Secondary | ICD-10-CM | POA: Diagnosis not present

## 2019-05-23 DIAGNOSIS — Q613 Polycystic kidney, unspecified: Secondary | ICD-10-CM | POA: Diagnosis not present

## 2019-05-23 DIAGNOSIS — F319 Bipolar disorder, unspecified: Secondary | ICD-10-CM | POA: Diagnosis not present

## 2019-05-23 DIAGNOSIS — N281 Cyst of kidney, acquired: Secondary | ICD-10-CM | POA: Diagnosis not present

## 2019-05-23 DIAGNOSIS — Z885 Allergy status to narcotic agent status: Secondary | ICD-10-CM | POA: Diagnosis not present

## 2019-05-23 DIAGNOSIS — Z79899 Other long term (current) drug therapy: Secondary | ICD-10-CM | POA: Diagnosis not present

## 2019-05-23 DIAGNOSIS — G2581 Restless legs syndrome: Secondary | ICD-10-CM | POA: Diagnosis not present

## 2019-05-23 LAB — COMPREHENSIVE METABOLIC PANEL
ALT: 11 U/L (ref 0–44)
AST: 13 U/L — ABNORMAL LOW (ref 15–41)
Albumin: 3.3 g/dL — ABNORMAL LOW (ref 3.5–5.0)
Alkaline Phosphatase: 54 U/L (ref 38–126)
Anion gap: 11 (ref 5–15)
BUN: 44 mg/dL — ABNORMAL HIGH (ref 8–23)
CO2: 24 mmol/L (ref 22–32)
Calcium: 8.1 mg/dL — ABNORMAL LOW (ref 8.9–10.3)
Chloride: 100 mmol/L (ref 98–111)
Creatinine, Ser: 6.1 mg/dL — ABNORMAL HIGH (ref 0.44–1.00)
GFR calc Af Amer: 7 mL/min — ABNORMAL LOW (ref >60)
GFR calc non Af Amer: 6 mL/min — ABNORMAL LOW (ref >60)
Glucose, Bld: 94 mg/dL (ref 70–99)
Potassium: 4.7 mmol/L (ref 3.5–5.1)
Sodium: 135 mmol/L (ref 135–145)
Total Bilirubin: 0.7 mg/dL (ref 0.3–1.2)
Total Protein: 5.6 g/dL — ABNORMAL LOW (ref 6.5–8.1)

## 2019-05-23 LAB — CBC
HCT: 24.3 % — ABNORMAL LOW (ref 36.0–46.0)
Hemoglobin: 8.3 g/dL — ABNORMAL LOW (ref 12.0–15.0)
MCH: 33.5 pg (ref 26.0–34.0)
MCHC: 34.2 g/dL (ref 30.0–36.0)
MCV: 98 fL (ref 80.0–100.0)
Platelets: 191 10*3/uL (ref 150–400)
RBC: 2.48 MIL/uL — ABNORMAL LOW (ref 3.87–5.11)
RDW: 12.7 % (ref 11.5–15.5)
WBC: 7 10*3/uL (ref 4.0–10.5)
nRBC: 0 % (ref 0.0–0.2)

## 2019-05-23 LAB — MAGNESIUM: Magnesium: 1.7 mg/dL (ref 1.7–2.4)

## 2019-05-23 LAB — MRSA PCR SCREENING: MRSA by PCR: NEGATIVE

## 2019-05-23 NOTE — Progress Notes (Signed)
To Dialysis; patient applied home "numbing medication" to Left upper arm. Barbaraann Faster, RN 8:54 PM 05/23/2019

## 2019-05-23 NOTE — Care Management Obs Status (Signed)
[facility] NOTIFICATION   Patient Details  Name: Derek Huneycutt MRN: [id number] Date of Birth: 03/29/1946   Medicare Observation Status Notification Given:  Yes    Beverly Sessions, RN 05/23/2019, 2:05 PM

## 2019-05-23 NOTE — Progress Notes (Addendum)
PROGRESS NOTE    Joyce [name]  FAO:[id number] DOB: 14-Jun-1946 DOA: 05/22/2019 PCP: [facility]   Brief Narrative:  73 year old female with history of ESRD TTS, polycystic kidney disease, bipolar disorder, GERD, IBS, essential hypertension presented to the hospital with complaints of left-sided flank pain.  She also had 1 syncopal episode at her dialysis center prior to hospitalization.  CT scan in the hospital showed hemorrhage cystic disease.  Hemoglobin has remained stable.   Assessment & Plan:   Principal Problem:   Renal cyst, native, hemorrhage Active Problems:   Left sided abdominal pain   Anemia associated with chronic renal failure   Polycystic kidney   Hematuria   ESRD on dialysis ([place])   Bipolar I disorder, most recent episode (or current) manic (HCC)   RLS (restless legs syndrome)   Hemorrhage and cyst with hematuria Polycystic kidney disease -Hemoglobin stable.  Pain is slightly better controlled.  Continue current regimen.  Bowel regimen as needed. -Empiric antibiotics. -If necessary we will get IR or vascular involved otherwise clinically monitor this.  One episode of syncope, resolved -Unclear etiology.  Possible vasovagal -Echocardiogram-ejection fraction 60 to 65%  ESRD on hemodialysis TTS -Nephrology following.  Planned dialysis per nephrology team.  Anemia of chronic disease -Hemoglobin stable.  Essential hypertension -Holding home meds for now.  We will see how she tolerates her dialysis today.  Irritable bowel syndrome -Follow-up outpatient GI  DVT prophylaxis: SCDs Code Status: Full code Family Communication: None at bedside Disposition Plan: Maintain hospital stay until cleared by nephrology. We need to closely monitor her especially during HD since she had an episode of syncope prior to her arrival at there HD. Complicated by Renal Hemorrhagic cyst. Unsafe for discharge.   Consultants:   Nephrology  Procedures:    None  Antimicrobials:   Rocephin   Subjective: Reports she still has some left-sided flank pain but greatly improved from yesterday.  Afebrile.  Review of Systems Otherwise negative except as per HPI, including: General: Denies fever, chills, night sweats or unintended weight loss. Resp: Denies cough, wheezing, shortness of breath. Cardiac: Denies chest pain, palpitations, orthopnea, paroxysmal nocturnal dyspnea. GI: Denies abdominal pain, nausea, vomiting, diarrhea or constipation GU: Denies dysuria, frequency, hesitancy or incontinence MS: Denies muscle aches, joint pain or swelling Neuro: Denies headache, neurologic deficits (focal weakness, numbness, tingling), abnormal gait Psych: Denies anxiety, depression, SI/HI/AVH Skin: Denies new rashes or lesions ID: Denies sick contacts, exotic exposures, travel  Objective: Vitals:   05/22/19 1900 05/22/19 2122 05/22/19 2158 05/23/19 0539  BP: (!) 129/47 125/85 (!) 145/61 (!) 136/56  Pulse: 65 68 69 64  Resp: 18 17 18 17   Temp:   98.3 F (36.8 C) 98.5 F (36.9 C)  TempSrc:   Oral Oral  SpO2: 98% 98% 99% 94%  Weight:   66.9 kg   Height:   5\' 3"  (1.6 m)     Intake/Output Summary (Last 24 hours) at 05/23/2019 1232 Last data filed at 05/23/2019 1121 Gross per 24 hour  Intake 240 ml  Output 900 ml  Net -660 ml   Filed Weights   05/21/19 2218 05/22/19 2158  Weight: 69.4 kg 66.9 kg    Examination:  General exam: Appears calm and comfortable  Respiratory system: Clear to auscultation. Respiratory effort normal. Cardiovascular system: S1 & S2 heard, RRR. No JVD, murmurs, rubs, gallops or clicks. No pedal edema. Gastrointestinal system: Abdomen is nondistended, soft and nontender. No organomegaly or masses felt. Normal bowel sounds heard.  Minimal left-sided CVA tenderness Central nervous system: Alert and oriented. No focal neurological deficits. Extremities: Symmetric 5 x 5 power. Skin: No rashes, lesions or  ulcers Psychiatry: Judgement and insight appear normal. Mood & affect appropriate.     Data Reviewed:   CBC: Recent Labs  Lab 05/20/19 1131 05/21/19 2222 05/22/19 0359 05/22/19 0818 05/22/19 1710 05/23/19 0421  WBC 8.9 8.5  --   --  6.7 7.0  NEUTROABS 7.4  --   --   --   --   --   HGB 10.1* 9.4* 9.3* 8.9* 8.6* 8.3*  HCT 29.9* 28.4* 28.3* 26.9* 25.5* 24.3*  MCV 97.4 99.6  --   --  99.2 98.0  PLT 210 215  --   --  203 741   Basic Metabolic Panel: Recent Labs  Lab 05/20/19 1131 05/22/19 0359 05/23/19 0421  NA 136 135 135  K 3.5 4.3 4.7  CL 99 97* 100  CO2 28 24 24   GLUCOSE 114* 100* 94  BUN 11 33* 44*  CREATININE 2.05* 5.29* 6.10*  CALCIUM 8.9 9.0 8.1*  MG  --   --  1.7   GFR: Estimated Creatinine Clearance: 7.7 mL/min (A) (by C-G formula based on SCr of 6.1 mg/dL (H)). Liver Function Tests: Recent Labs  Lab 05/20/19 1131 05/23/19 0421  AST 17 13*  ALT 14 11  ALKPHOS 77 54  BILITOT 0.8 0.7  PROT 6.7 5.6*  ALBUMIN 4.0 3.3*   No results for input(s): LIPASE, AMYLASE in the last 168 hours. No results for input(s): AMMONIA in the last 168 hours. Coagulation Profile: No results for input(s): INR, PROTIME in the last 168 hours. Cardiac Enzymes: No results for input(s): CKTOTAL, CKMB, CKMBINDEX, TROPONINI in the last 168 hours. BNP (last 3 results) No results for input(s): PROBNP in the last 8760 hours. HbA1C: No results for input(s): HGBA1C in the last 72 hours. CBG: No results for input(s): GLUCAP in the last 168 hours. Lipid Profile: No results for input(s): CHOL, HDL, LDLCALC, TRIG, CHOLHDL, LDLDIRECT in the last 72 hours. Thyroid Function Tests: No results for input(s): TSH, T4TOTAL, FREET4, T3FREE, THYROIDAB in the last 72 hours. Anemia Panel: No results for input(s): VITAMINB12, FOLATE, FERRITIN, TIBC, IRON, RETICCTPCT in the last 72 hours. Sepsis Labs: No results for input(s): PROCALCITON, LATICACIDVEN in the last 168 hours.  Recent Results  (from the past 240 hour(s))  SARS CORONAVIRUS 2 (TAT 6-24 HRS) Nasopharyngeal Nasopharyngeal Swab     Status: None   Collection Time: 05/22/19  6:55 AM   Specimen: Nasopharyngeal Swab  Result Value Ref Range Status   SARS Coronavirus 2 NEGATIVE NEGATIVE Final    Comment: (NOTE) SARS-CoV-2 target nucleic acids are NOT DETECTED. The SARS-CoV-2 RNA is generally detectable in upper and lower respiratory specimens during the acute phase of infection. Negative results do not preclude SARS-CoV-2 infection, do not rule out co-infections with other pathogens, and should not be used as the sole basis for treatment or other patient management decisions. Negative results must be combined with clinical observations, patient history, and epidemiological information. The expected result is Negative. Fact Sheet for Patients: SugarRoll.be Fact Sheet for Healthcare Providers: https://www.woods-mathews.com/ This test is not yet approved or cleared by the [place] FDA and  has been authorized for detection and/or diagnosis of SARS-CoV-2 by FDA under an Emergency Use Authorization (EUA). This EUA will remain  in effect (meaning this test can be used) for the duration of the COVID-19 declaration under Section 56 4(b)(1) of  the Act, 21 U.S.C. section 360bbb-3(b)(1), unless the authorization is terminated or revoked sooner. Performed at [facility], [place] [address], [place], [place] [address]   MRSA PCR Screening     Status: None   Collection Time: 05/23/19 12:57 AM   Specimen: Nasal Mucosa; Nasopharyngeal  Result Value Ref Range Status   MRSA by PCR NEGATIVE NEGATIVE Final    Comment:        The GeneXpert MRSA Assay (FDA approved for NASAL specimens only), is one component of a comprehensive MRSA colonization surveillance program. It is not intended to diagnose MRSA infection nor to guide or monitor treatment for MRSA infections. Performed at  [facility], [address], [place], [place] [address]          Radiology Studies: Ct Renal Stone Study  Result Date: 05/22/2019 CLINICAL DATA:  Hematuria EXAM: CT ABDOMEN AND PELVIS WITHOUT CONTRAST TECHNIQUE: Multidetector CT imaging of the abdomen and pelvis was performed following the standard protocol without IV contrast. COMPARISON:  03/11/2017 FINDINGS: LOWER CHEST: Moderate cardiomegaly. HEPATOBILIARY: Normal hepatic contours. There is no intra- or extrahepatic biliary dilatation. Status post cholecystectomy. PANCREAS: Normal pancreatic contours without pancreatic ductal dilatation or peripancreatic fluid collection. SPLEEN: Normal. ADRENALS/URINARY TRACT: --Adrenal glands: Normal. --Right kidney/ureter: Numerous cyst with diffuse renal atrophy. No hydronephrosis. The right ureter is unobstructed. --Left kidney/ureter: Numerous renal cysts including 2 hyperdense cysts measuring up 2 2.1 cm. --Urinary bladder: Normal for degree of distention STOMACH/BOWEL: --Stomach/Duodenum: There is no hiatal hernia. The duodenal course and caliber are normal. --Small bowel: No dilatation or inflammation. --Colon: Multifocal colonic diverticulosis without acute inflammation. --Appendix: Not visualized. No right lower quadrant inflammation or free fluid. VASCULAR/LYMPHATIC: There is calcific atherosclerosis of the abdominal aorta. No abdominal or pelvic lymphadenopathy. REPRODUCTIVE: Status post hysterectomy. There is a left adnexal cyst that measures 4.2 x 3.5 cm, slightly increased in size. MUSCULOSKELETAL. No bony spinal canal stenosis or focal osseous abnormality. OTHER: None. IMPRESSION: 1. No acute abdominal or pelvic abnormality. 2. Numerous bilateral simple and complex renal cysts, including 2 new hyperdense cysts, likely hemorrhagic, near the left upper pole. 3. Slight increase in size of left adnexal cyst, now measuring 4.2 x 3.5 cm. 4. Aortic Atherosclerosis (ICD10-I70.0). Electronically  Signed   By: Ulyses Jarred M.D.   On: 05/22/2019 04:20        Scheduled Meds:  brimonidine  1 drop Both Eyes BID   carbamazepine  100 mg Oral BID   Chlorhexidine Gluconate Cloth  6 each Topical Q0600   dicyclomine  20 mg Oral TID AC   epoetin (EPOGEN/PROCRIT) injection  4,000 Units Intravenous Q M,W,F-HD   famotidine  20 mg Oral Daily   haloperidol  2.5 mg Oral Q M,W,F   latanoprost  1 drop Both Eyes QHS   multivitamin  1 tablet Oral Daily   pantoprazole  40 mg Oral Daily   rOPINIRole  2 mg Oral Daily   timolol  1 drop Both Eyes BID   traZODone  25 mg Oral QHS   ziprasidone  20 mg Oral QHS   Continuous Infusions:  cefTRIAXone (ROCEPHIN)  IV 1 g (05/23/19 1214)     LOS: 0 days   Time spent=35 mins    Mashanda Ishibashi Arsenio Loader, MD [facility]  If 7PM-7AM, please contact night-coverage  05/23/2019, 12:32 PM

## 2019-05-23 NOTE — Progress Notes (Signed)
[facility], [place] 05/23/19  Subjective:  Patient denies hematuria at the moment. Still hurting a bit on her left side. Due for dialysis today.    Objective:  Vital signs in last 24 hours:  Temp:  [98 F (36.7 C)-98.5 F (36.9 C)] 98 F (36.7 C) (11/02 1253) Pulse Rate:  [62-69] 62 (11/02 1253) Resp:  [16-18] 16 (11/02 1253) BP: (125-145)/(47-85) 137/56 (11/02 1253) SpO2:  [94 %-99 %] 98 % (11/02 1253) Weight:  [66.9 kg] 66.9 kg (11/01 2158)  Weight change: -2.54 kg Filed Weights   05/21/19 2218 05/22/19 2158  Weight: 69.4 kg 66.9 kg    Intake/Output:    Intake/Output Summary (Last 24 hours) at 05/23/2019 1516 Last data filed at 05/23/2019 1515 Gross per 24 hour  Intake 580 ml  Output 1150 ml  Net -570 ml     Physical Exam: General:  No acute distress, laying in the bed  HEENT  moist oral mucous membranes  Pulm/lungs  room air, clear to auscultation  CVS/Heart  regular, no rub   Abdomen:   Soft, left flank tenderness  Extremities:  No edema  Neurologic:  Alert, oriented  Skin:  No acute rashes  Access:  Left arm AV fistula       Basic Metabolic Panel:  Recent Labs  Lab 05/20/19 1131 05/22/19 0359 05/23/19 0421  NA 136 135 135  K 3.5 4.3 4.7  CL 99 97* 100  CO2 28 24 24   GLUCOSE 114* 100* 94  BUN 11 33* 44*  CREATININE 2.05* 5.29* 6.10*  CALCIUM 8.9 9.0 8.1*  MG  --   --  1.7     CBC: Recent Labs  Lab 05/20/19 1131 05/21/19 2222 05/22/19 0359 05/22/19 0818 05/22/19 1710 05/23/19 0421  WBC 8.9 8.5  --   --  6.7 7.0  NEUTROABS 7.4  --   --   --   --   --   HGB 10.1* 9.4* 9.3* 8.9* 8.6* 8.3*  HCT 29.9* 28.4* 28.3* 26.9* 25.5* 24.3*  MCV 97.4 99.6  --   --  99.2 98.0  PLT 210 215  --   --  203 191      Lab Results  Component Value Date   HEPBSAG Negative 12/16/2017   HEPBSAB Non Reactive 09/18/2017      Microbiology:  Recent Results (from the past 240 hour(s))  SARS CORONAVIRUS 2 (TAT 6-24 HRS)  Nasopharyngeal Nasopharyngeal Swab     Status: None   Collection Time: 05/22/19  6:55 AM   Specimen: Nasopharyngeal Swab  Result Value Ref Range Status   SARS Coronavirus 2 NEGATIVE NEGATIVE Final    Comment: (NOTE) SARS-CoV-2 target nucleic acids are NOT DETECTED. The SARS-CoV-2 RNA is generally detectable in upper and lower respiratory specimens during the acute phase of infection. Negative results do not preclude SARS-CoV-2 infection, do not rule out co-infections with other pathogens, and should not be used as the sole basis for treatment or other patient management decisions. Negative results must be combined with clinical observations, patient history, and epidemiological information. The expected result is Negative. Fact Sheet for Patients: SugarRoll.be Fact Sheet for Healthcare Providers: https://www.woods-mathews.com/ This test is not yet approved or cleared by the [place] FDA and  has been authorized for detection and/or diagnosis of SARS-CoV-2 by FDA under an Emergency Use Authorization (EUA). This EUA will remain  in effect (meaning this test can be used) for the duration of the COVID-19 declaration under Section 56 4(b)(1)  of the Act, 21 U.S.C. section 360bbb-3(b)(1), unless the authorization is terminated or revoked sooner. Performed at [facility], [place] [address] [place], [place] [address]   MRSA PCR Screening     Status: None   Collection Time: 05/23/19 12:57 AM   Specimen: Nasal Mucosa; Nasopharyngeal  Result Value Ref Range Status   MRSA by PCR NEGATIVE NEGATIVE Final    Comment:        The GeneXpert MRSA Assay (FDA approved for NASAL specimens only), is one component of a comprehensive MRSA colonization surveillance program. It is not intended to diagnose MRSA infection nor to guide or monitor treatment for MRSA infections. Performed at [facility], [address] [place], [place]  [address]     Coagulation Studies: No results for input(s): LABPROT, INR in the last 72 hours.  Urinalysis: Recent Labs    05/21/19 2221  COLORURINE AMBER*  LABSPEC 1.005  PHURINE 8.0  GLUCOSEU NEGATIVE  HGBUR LARGE*  BILIRUBINUR NEGATIVE  KETONESUR NEGATIVE  PROTEINUR 100*  NITRITE NEGATIVE  LEUKOCYTESUR MODERATE*      Imaging: Ct Renal Stone Study  Result Date: 05/22/2019 CLINICAL DATA:  Hematuria EXAM: CT ABDOMEN AND PELVIS WITHOUT CONTRAST TECHNIQUE: Multidetector CT imaging of the abdomen and pelvis was performed following the standard protocol without IV contrast. COMPARISON:  03/11/2017 FINDINGS: LOWER CHEST: Moderate cardiomegaly. HEPATOBILIARY: Normal hepatic contours. There is no intra- or extrahepatic biliary dilatation. Status post cholecystectomy. PANCREAS: Normal pancreatic contours without pancreatic ductal dilatation or peripancreatic fluid collection. SPLEEN: Normal. ADRENALS/URINARY TRACT: --Adrenal glands: Normal. --Right kidney/ureter: Numerous cyst with diffuse renal atrophy. No hydronephrosis. The right ureter is unobstructed. --Left kidney/ureter: Numerous renal cysts including 2 hyperdense cysts measuring up 2 2.1 cm. --Urinary bladder: Normal for degree of distention STOMACH/BOWEL: --Stomach/Duodenum: There is no hiatal hernia. The duodenal course and caliber are normal. --Small bowel: No dilatation or inflammation. --Colon: Multifocal colonic diverticulosis without acute inflammation. --Appendix: Not visualized. No right lower quadrant inflammation or free fluid. VASCULAR/LYMPHATIC: There is calcific atherosclerosis of the abdominal aorta. No abdominal or pelvic lymphadenopathy. REPRODUCTIVE: Status post hysterectomy. There is a left adnexal cyst that measures 4.2 x 3.5 cm, slightly increased in size. MUSCULOSKELETAL. No bony spinal canal stenosis or focal osseous abnormality. OTHER: None. IMPRESSION: 1. No acute abdominal or pelvic abnormality. 2. Numerous bilateral  simple and complex renal cysts, including 2 new hyperdense cysts, likely hemorrhagic, near the left upper pole. 3. Slight increase in size of left adnexal cyst, now measuring 4.2 x 3.5 cm. 4. Aortic Atherosclerosis (ICD10-I70.0). Electronically Signed   By: Ulyses Jarred M.D.   On: 05/22/2019 04:20     Medications:   . cefTRIAXone (ROCEPHIN)  IV Stopped (05/23/19 1246)   . brimonidine  1 drop Both Eyes BID  . carbamazepine  100 mg Oral BID  . Chlorhexidine Gluconate Cloth  6 each Topical Q0600  . dicyclomine  20 mg Oral TID AC  . epoetin (EPOGEN/PROCRIT) injection  4,000 Units Intravenous Q M,W,F-HD  . famotidine  20 mg Oral Daily  . haloperidol  2.5 mg Oral Q M,W,F  . latanoprost  1 drop Both Eyes QHS  . multivitamin  1 tablet Oral Daily  . pantoprazole  40 mg Oral Daily  . rOPINIRole  2 mg Oral Daily  . timolol  1 drop Both Eyes BID  . traZODone  25 mg Oral QHS  . ziprasidone  20 mg Oral QHS   acetaminophen **OR** acetaminophen, ipratropium, ondansetron **OR** ondansetron (ZOFRAN) IV, oxyCODONE,  traMADol  Assessment/ Plan:  73 y.o. female with end-stage renal disease on hemodialysis, polycystic kidney disease, history of bipolar disorder, admitted for hemorrhagic left cyst.  Principal Problem:   Renal cyst, native, hemorrhage Active Problems:   Left sided abdominal pain   Anemia associated with chronic renal failure   Polycystic kidney   Hematuria   ESRD on dialysis ([place])   Bipolar I disorder, most recent episode (or current) manic ([facility])   RLS (restless legs syndrome)  [facility] MWF-1  TW 69kg left arm AV fistula  #. ESRD -Patient due for hemodialysis today.  Orders have been prepared.   #. Anemia of CKD  Lab Results  Component Value Date   HGB 8.3 (L) 05/23/2019  Maintain the patient on Epogen 4000 units IV with dialysis treatments.  #. SHPTH  No results found for: PTH Lab Results  Component Value Date   PHOS 3.8 03/18/2018  Phosphorus currently 3.8  and at target.  Continue to monitor bone mineral metabolism parameters.   #.  Polycystic kidney disease with left kidney hemorrhagic cyst. -Hemoglobin has drifted down slightly to 8.3.  Patient has known polycystic kidney disease.  Continue to monitor hemoglobin.  No hematuria today.    LOS: 0 Paiton Fosco 11/2/20203:16 PM  [place], [place]

## 2019-05-23 NOTE — Progress Notes (Signed)
   05/23/19 0700  Clinical Encounter Type  Visited With Patient  Visit Type Initial;Spiritual support;Other (Comment)  Referral From Nurse  Spiritual Encounters  Spiritual Needs Prayer;Emotional;Grief support;Other (Comment)  Stress Factors  Patient Stress Factors Health changes;Loss

## 2019-05-24 DIAGNOSIS — N2889 Other specified disorders of kidney and ureter: Secondary | ICD-10-CM | POA: Diagnosis not present

## 2019-05-24 DIAGNOSIS — N281 Cyst of kidney, acquired: Secondary | ICD-10-CM | POA: Diagnosis not present

## 2019-05-24 LAB — COMPREHENSIVE METABOLIC PANEL
ALT: 10 U/L (ref 0–44)
AST: 14 U/L — ABNORMAL LOW (ref 15–41)
Albumin: 3.3 g/dL — ABNORMAL LOW (ref 3.5–5.0)
Alkaline Phosphatase: 55 U/L (ref 38–126)
Anion gap: 10 (ref 5–15)
BUN: 28 mg/dL — ABNORMAL HIGH (ref 8–23)
CO2: 27 mmol/L (ref 22–32)
Calcium: 8.3 mg/dL — ABNORMAL LOW (ref 8.9–10.3)
Chloride: 100 mmol/L (ref 98–111)
Creatinine, Ser: 4.46 mg/dL — ABNORMAL HIGH (ref 0.44–1.00)
GFR calc Af Amer: 11 mL/min — ABNORMAL LOW (ref >60)
GFR calc non Af Amer: 9 mL/min — ABNORMAL LOW (ref >60)
Glucose, Bld: 96 mg/dL (ref 70–99)
Potassium: 4.3 mmol/L (ref 3.5–5.1)
Sodium: 137 mmol/L (ref 135–145)
Total Bilirubin: 0.8 mg/dL (ref 0.3–1.2)
Total Protein: 5.6 g/dL — ABNORMAL LOW (ref 6.5–8.1)

## 2019-05-24 LAB — CBC
HCT: 24.5 % — ABNORMAL LOW (ref 36.0–46.0)
Hemoglobin: 8.3 g/dL — ABNORMAL LOW (ref 12.0–15.0)
MCH: 33.3 pg (ref 26.0–34.0)
MCHC: 33.9 g/dL (ref 30.0–36.0)
MCV: 98.4 fL (ref 80.0–100.0)
Platelets: 202 10*3/uL (ref 150–400)
RBC: 2.49 MIL/uL — ABNORMAL LOW (ref 3.87–5.11)
RDW: 12.2 % (ref 11.5–15.5)
WBC: 6.4 10*3/uL (ref 4.0–10.5)
nRBC: 0 % (ref 0.0–0.2)

## 2019-05-24 LAB — PHOSPHORUS: Phosphorus: 4.1 mg/dL (ref 2.5–4.6)

## 2019-05-24 LAB — MAGNESIUM: Magnesium: 1.8 mg/dL (ref 1.7–2.4)

## 2019-05-24 MED ORDER — CIPROFLOXACIN HCL 500 MG PO TABS: 500.0000 mg | ORAL_TABLET | Freq: Two times a day (BID) | ORAL | 0 refills | Status: AC

## 2019-05-24 NOTE — Progress Notes (Signed)
MD ordered patient to be discharged back to [facility].  Discharge instructions were reviewed with the patient and she voiced understanding.  Follow-up appointment was made.  Prescription sent to the patients pharmacy.  IV was removed with catheter intact.  All patients questions were answered.  Patient left via wheelchair escorted by NT.

## 2019-05-24 NOTE — Discharge Summary (Signed)
Physician Discharge Summary  Joyce [name] RKY:[id number] DOB: 1945-09-24 DOA: 05/22/2019  PCP: [facility]  Admit date: 05/22/2019 Discharge date: 05/24/2019  Admitted From: Assisted living Disposition: Assisted living  Recommendations for Outpatient Follow-up:  1. Follow up with PCP in 1-2 weeks 2. Please obtain BMP/CBC in one week your next doctors visit.  3. Ciprofloxacin prescribed earlier 4. Wants to continue taking Tylenol for pain control   Discharge Condition: Stable CODE STATUS: Full code Diet recommendation: Renal  Brief/Interim Summary: 73 year old female with history of ESRD TTS, polycystic kidney disease, bipolar disorder, GERD, IBS, essential hypertension presented to the hospital with complaints of left-sided flank pain.  She also had 1 syncopal episode at her dialysis center prior to hospitalization.  CT scan in the hospital showed hemorrhage cystic disease.  Hemoglobin has remained stable.  She tolerated session of dialysis in the hospital.  Syncope work-up was negative.  Hemoglobin remained stable, pain is well controlled.  She would like to continue taking only Tylenol at home.  Stable for discharge.    Discharge Diagnoses:  Principal Problem:   Renal cyst, native, hemorrhage Active Problems:   Left sided abdominal pain   Anemia associated with chronic renal failure   Polycystic kidney   Hematuria   ESRD on dialysis ([facility])   Bipolar I disorder, most recent episode (or current) manic ([facility])   RLS (restless legs syndrome)   Renal hemorrhage, left  Hemorrhage and cyst with hematuria Polycystic kidney disease -Hemoglobin remained stable.  Pain is well controlled on Tylenol. -Repeat CBC in about 1 week. -Empiric antibiotic, ciprofloxacin for 5 more days.  One episode of syncope, resolved -Unclear etiology.  Possible vasovagal -Echocardiogram-ejection fraction 60 to 65%  ESRD on hemodialysis TTS -Nephrology following.    Tolerated  hospital session on 05/23/2019.  Resume outpatient.  Anemia of chronic disease -Hemoglobin stable.  Essential hypertension -Resume home medications  Irritable bowel syndrome -Follow-up outpatient GI  Consultations:  Nephrology  Subjective: Feels well, no complaints.  Pain is well controlled and she would only like to take Tylenol at home for pain.  No other complaints.  Discharge Exam: Vitals:   05/24/19 0027 05/24/19 0453  BP: (!) 130/54 (!) 127/54  Pulse: 66 71  Resp: 18 16  Temp: 98.1 F (36.7 C) 98.3 F (36.8 C)  SpO2: 96% 94%   Vitals:   05/23/19 2114 05/23/19 2315 05/24/19 0027 05/24/19 0453  BP: 122/81 (!) 118/43 (!) 130/54 (!) 127/54  Pulse: 69 65 66 71  Resp: 16 16 18 16   Temp: 98.4 F (36.9 C) 98.4 F (36.9 C) 98.1 F (36.7 C) 98.3 F (36.8 C)  TempSrc: Oral Oral Oral Oral  SpO2:   96% 94%  Weight:      Height:        General: Pt is alert, awake, not in acute distress Cardiovascular: RRR, S1/S2 +, no rubs, no gallops Respiratory: CTA bilaterally, no wheezing, no rhonchi Abdominal: Soft, NT, ND, bowel sounds + Extremities: no edema, no cyanosis Fistula noted without any evidence of active bleeding.  Discharge Instructions  Discharge Instructions    Discharge patient   Complete by: As directed    Discharge disposition: 01-Home or Self Care   Discharge patient date: 05/24/2019     Allergies as of 05/24/2019      Reactions   Morphine And Related Shortness Of Breath   Pt reports chest pain and difficulty breathing.   Gabapentin    Indomethacin Hives   Pollen Extract Other (  See Comments)   Sinus problems and HA      Medication List    TAKE these medications   acetaminophen 325 MG tablet Commonly known as: TYLENOL Take 650 mg by mouth every 6 (six) hours as needed for mild pain or fever.   b complex-vitamin c-folic acid 0.8 MG Tabs tablet Take 1 tablet by mouth daily.   brimonidine 0.2 % ophthalmic solution Commonly known as:  ALPHAGAN Place 1 drop into both eyes 2 (two) times daily.   calcium acetate 667 MG capsule Commonly known as: PHOSLO Take 667 mg by mouth 3 (three) times daily with meals.   carbamazepine 100 MG chewable tablet Commonly known as: TEGRETOL Chew 100 mg by mouth 2 (two) times daily.   ciprofloxacin 500 MG tablet Commonly known as: Cipro Take 1 tablet (500 mg total) by mouth 2 (two) times daily for 5 days.   dicyclomine 20 MG tablet Commonly known as: BENTYL Take 20 mg by mouth 3 (three) times daily after meals.   famotidine 20 MG tablet Commonly known as: PEPCID Take 1 tablet (20 mg total) by mouth daily.   haloperidol 5 MG tablet Commonly known as: HALDOL Take 2.5 mg by mouth 3 (three) times a week. (Tuesday, Thursday and Saturday)   ipratropium 0.03 % nasal spray Commonly known as: ATROVENT Place 2 sprays into both nostrils 2 (two) times daily as needed for rhinitis.   latanoprost 0.005 % ophthalmic solution Commonly known as: XALATAN Place 1 drop into both eyes at bedtime.   lidocaine-prilocaine cream Commonly known as: EMLA Apply 1 application topically every Monday, Wednesday, and Friday with hemodialysis.   loratadine 10 MG tablet Commonly known as: CLARITIN Take 10 mg by mouth daily.   metoprolol tartrate 25 MG tablet Commonly known as: LOPRESSOR Take 1 tablet (25 mg total) by mouth 2 (two) times daily. What changed: additional instructions   midodrine 10 MG tablet Commonly known as: PROAMATINE Take 10 mg by mouth See admin instructions. Daily prn as needed on Dialysis days for SBP < 100   ondansetron 4 MG tablet Commonly known as: ZOFRAN Take 4 mg by mouth every 6 (six) hours as needed for nausea.   rOPINIRole 2 MG tablet Commonly known as: REQUIP Take 1 tablet (2 mg total) by mouth daily.   timolol 0.5 % ophthalmic solution Commonly known as: TIMOPTIC Place 1 drop into both eyes 2 (two) times daily.   traMADol 50 MG tablet Commonly known as:  Ultram Take 1 tablet (50 mg total) by mouth every 6 (six) hours as needed.   traZODone 50 MG tablet Commonly known as: DESYREL Take 25 mg by mouth at bedtime.   vitamin C 250 MG tablet Commonly known as: ASCORBIC ACID Take 250 mg by mouth 2 (two) times daily.   ziprasidone 20 MG capsule Commonly known as: GEODON Take 20 mg by mouth at bedtime.      Follow-up Information    [facility]. Schedule an appointment as soon as possible for a visit in 1 week(s).   Specialty: Assisted Living Facility Contact information: [address] [place] [address] [phone]        Lateef, [place], MD. Schedule an appointment as soon as possible for a visit in 2 weeks.   Specialty: Nephrology Why: angel said to follow up with her unit Contact information: [place] [address] [phone]          Allergies  Allergen Reactions  .  Morphine And Related Shortness Of Breath    Pt reports chest pain and difficulty breathing.  . Gabapentin   . Indomethacin Hives  . Pollen Extract Other (See Comments)    Sinus problems and HA    You were cared for by a hospitalist during your hospital stay. If you have any questions about your discharge medications or the care you received while you were in the hospital after you are discharged, you can call the unit and asked to speak with the hospitalist on call if the hospitalist that took care of you is not available. Once you are discharged, your primary care physician will handle any further medical issues. Please note that no refills for any discharge medications will be authorized once you are discharged, as it is imperative that you return to your primary care physician (or establish a relationship with a primary care physician if you do not have one) for your aftercare needs so that they can reassess your need for medications and monitor your lab values.   Procedures/Studies: Dg Ribs Unilateral  W/chest Left  Result Date: 05/20/2019 CLINICAL DATA:  Pain following fall EXAM: LEFT RIBS AND CHEST - 3+ VIEW COMPARISON:  Chest radiograph March 07, 2018 FINDINGS: Frontal chest as well as oblique and cone-down rib images were obtained. There is no edema or consolidation. Heart is mildly enlarged with pulmonary vascularity normal. No adenopathy. There is a stent involving the left subclavian and axillary regions. There is aortic atherosclerosis. No evident rib fracture.  No pneumothorax or pleural effusion. IMPRESSION: No evident rib fracture. No edema or consolidation. Cardiomegaly. Aortic Atherosclerosis (ICD10-I70.0). Electronically Signed   By: Lowella Grip III M.D.   On: 05/20/2019 12:26   Ct Head Wo Contrast  Result Date: 05/20/2019 CLINICAL DATA:  73 year old female with history of head trauma. Syncopal episode complicated by a fall. EXAM: CT HEAD WITHOUT CONTRAST TECHNIQUE: Contiguous axial images were obtained from the base of the skull through the vertex without intravenous contrast. COMPARISON:  None. FINDINGS: Brain: Patchy areas of decreased attenuation are noted throughout the deep and periventricular white matter of the cerebral hemispheres bilaterally, compatible with mild chronic microvascular ischemic disease. No evidence of acute infarction, hemorrhage, hydrocephalus, extra-axial collection or mass lesion/mass effect. Vascular: No hyperdense vessel or unexpected calcification. Skull: Normal. Negative for fracture or focal lesion. Sinuses/Orbits: No acute finding. Other: Small left mastoid effusion. IMPRESSION: 1. No acute intracranial abnormalities. 2. Small left mastoid effusion. 3. Mild chronic microvascular ischemic changes in the cerebral white matter, as above. Electronically Signed   By: Vinnie Langton M.D.   On: 05/20/2019 12:03   US Renal  Result Date: 05/20/2019 CLINICAL DATA:  Polycystic kidney disease, acute left flank pain, dialysis dependent EXAM: RENAL / URINARY  TRACT ULTRASOUND COMPLETE COMPARISON:  03/11/2017 FINDINGS: Right Kidney: Renal measurements: 11.4 x 6.2 x 6.0 cm = volume: 217 mL. Increased cortical echogenicity with innumerable anechoic and hypoechoic renal cysts compatible with polycystic kidney disease. No definite hydronephrosis. Left Kidney: Renal measurements: 12.3 x 6.5 x 5.9 cm = volume: 247 mL. Similar increased renal echogenicity with innumerable hypoechoic and anechoic renal cysts throughout. No definite hydronephrosis. Bladder: Appears normal for degree of bladder distention. Other: None. IMPRESSION: Chronic polycystic kidney disease without definite hydronephrosis by ultrasound. No other acute finding. Electronically Signed   By: Jerilynn Mages.  Shick M.D.   On: 05/20/2019 13:54   Ct Renal Stone Study  Result Date: 05/22/2019 CLINICAL DATA:  Hematuria EXAM: CT ABDOMEN AND PELVIS WITHOUT CONTRAST TECHNIQUE:  Multidetector CT imaging of the abdomen and pelvis was performed following the standard protocol without IV contrast. COMPARISON:  03/11/2017 FINDINGS: LOWER CHEST: Moderate cardiomegaly. HEPATOBILIARY: Normal hepatic contours. There is no intra- or extrahepatic biliary dilatation. Status post cholecystectomy. PANCREAS: Normal pancreatic contours without pancreatic ductal dilatation or peripancreatic fluid collection. SPLEEN: Normal. ADRENALS/URINARY TRACT: --Adrenal glands: Normal. --Right kidney/ureter: Numerous cyst with diffuse renal atrophy. No hydronephrosis. The right ureter is unobstructed. --Left kidney/ureter: Numerous renal cysts including 2 hyperdense cysts measuring up 2 2.1 cm. --Urinary bladder: Normal for degree of distention STOMACH/BOWEL: --Stomach/Duodenum: There is no hiatal hernia. The duodenal course and caliber are normal. --Small bowel: No dilatation or inflammation. --Colon: Multifocal colonic diverticulosis without acute inflammation. --Appendix: Not visualized. No right lower quadrant inflammation or free fluid.  VASCULAR/LYMPHATIC: There is calcific atherosclerosis of the abdominal aorta. No abdominal or pelvic lymphadenopathy. REPRODUCTIVE: Status post hysterectomy. There is a left adnexal cyst that measures 4.2 x 3.5 cm, slightly increased in size. MUSCULOSKELETAL. No bony spinal canal stenosis or focal osseous abnormality. OTHER: None. IMPRESSION: 1. No acute abdominal or pelvic abnormality. 2. Numerous bilateral simple and complex renal cysts, including 2 new hyperdense cysts, likely hemorrhagic, near the left upper pole. 3. Slight increase in size of left adnexal cyst, now measuring 4.2 x 3.5 cm. 4. Aortic Atherosclerosis (ICD10-I70.0). Electronically Signed   By: Ulyses Jarred M.D.   On: 05/22/2019 04:20      The results of significant diagnostics from this hospitalization (including imaging, microbiology, ancillary and laboratory) are listed below for reference.     Microbiology: Recent Results (from the past 240 hour(s))  SARS CORONAVIRUS 2 (TAT 6-24 HRS) Nasopharyngeal Nasopharyngeal Swab     Status: None   Collection Time: 05/22/19  6:55 AM   Specimen: Nasopharyngeal Swab  Result Value Ref Range Status   SARS Coronavirus 2 NEGATIVE NEGATIVE Final    Comment: (NOTE) SARS-CoV-2 target nucleic acids are NOT DETECTED. The SARS-CoV-2 RNA is generally detectable in upper and lower respiratory specimens during the acute phase of infection. Negative results do not preclude SARS-CoV-2 infection, do not rule out co-infections with other pathogens, and should not be used as the sole basis for treatment or other patient management decisions. Negative results must be combined with clinical observations, patient history, and epidemiological information. The expected result is Negative. Fact Sheet for Patients: SugarRoll.be Fact Sheet for Healthcare Providers: https://www.woods-mathews.com/ This test is not yet approved or cleared by the [place] FDA and   has been authorized for detection and/or diagnosis of SARS-CoV-2 by FDA under an Emergency Use Authorization (EUA). This EUA will remain  in effect (meaning this test can be used) for the duration of the COVID-19 declaration under Section 56 4(b)(1) of the Act, 21 U.S.C. section 360bbb-3(b)(1), unless the authorization is terminated or revoked sooner. Performed at [facility], [place] [address], [place], [place] [address]   MRSA PCR Screening     Status: None   Collection Time: 05/23/19 12:57 AM   Specimen: Nasal Mucosa; Nasopharyngeal  Result Value Ref Range Status   MRSA by PCR NEGATIVE NEGATIVE Final    Comment:        The GeneXpert MRSA Assay (FDA approved for NASAL specimens only), is one component of a comprehensive MRSA colonization surveillance program. It is not intended to diagnose MRSA infection nor to guide or monitor treatment for MRSA infections. Performed at [facility], [address] [place], [place] [address]      Labs: BNP (  last 3 results) No results for input(s): BNP in the last 8760 hours. Basic Metabolic Panel: Recent Labs  Lab 05/20/19 1131 05/22/19 0359 05/23/19 0421 05/23/19 0513 05/24/19 0513  NA 136 135 135  --  137  K 3.5 4.3 4.7  --  4.3  CL 99 97* 100  --  100  CO2 28 24 24   --  27  GLUCOSE 114* 100* 94  --  96  BUN 11 33* 44*  --  28*  CREATININE 2.05* 5.29* 6.10*  --  4.46*  CALCIUM 8.9 9.0 8.1*  --  8.3*  MG  --   --  1.7  --  1.8  PHOS  --   --   --  4.1  --    Liver Function Tests: Recent Labs  Lab 05/20/19 1131 05/23/19 0421 05/24/19 0513  AST 17 13* 14*  ALT 14 11 10   ALKPHOS 77 54 55  BILITOT 0.8 0.7 0.8  PROT 6.7 5.6* 5.6*  ALBUMIN 4.0 3.3* 3.3*   No results for input(s): LIPASE, AMYLASE in the last 168 hours. No results for input(s): AMMONIA in the last 168 hours. CBC: Recent Labs  Lab 05/20/19 1131 05/21/19 2222 05/22/19 0359 05/22/19 0818 05/22/19 1710 05/23/19 0421 05/24/19 0513   WBC 8.9 8.5  --   --  6.7 7.0 6.4  NEUTROABS 7.4  --   --   --   --   --   --   HGB 10.1* 9.4* 9.3* 8.9* 8.6* 8.3* 8.3*  HCT 29.9* 28.4* 28.3* 26.9* 25.5* 24.3* 24.5*  MCV 97.4 99.6  --   --  99.2 98.0 98.4  PLT 210 215  --   --  203 191 202   Cardiac Enzymes: No results for input(s): CKTOTAL, CKMB, CKMBINDEX, TROPONINI in the last 168 hours. BNP: Invalid input(s): POCBNP CBG: No results for input(s): GLUCAP in the last 168 hours. D-Dimer No results for input(s): DDIMER in the last 72 hours. Hgb A1c No results for input(s): HGBA1C in the last 72 hours. Lipid Profile No results for input(s): CHOL, HDL, LDLCALC, TRIG, CHOLHDL, LDLDIRECT in the last 72 hours. Thyroid function studies No results for input(s): TSH, T4TOTAL, T3FREE, THYROIDAB in the last 72 hours.  Invalid input(s): FREET3 Anemia work up No results for input(s): VITAMINB12, FOLATE, FERRITIN, TIBC, IRON, RETICCTPCT in the last 72 hours. Urinalysis    Component Value Date/Time   COLORURINE AMBER (A) 05/21/2019 2221   APPEARANCEUR CLOUDY (A) 05/21/2019 2221   APPEARANCEUR Clear 05/10/2014 1344   LABSPEC 1.005 05/21/2019 2221   LABSPEC 1.009 05/10/2014 1344   PHURINE 8.0 05/21/2019 [place] 05/21/2019 2221   GLUCOSEU Negative 05/10/2014 1344   HGBUR LARGE (A) 05/21/2019 2221   BILIRUBINUR NEGATIVE 05/21/2019 2221   BILIRUBINUR Negative 05/10/2014 1344   [place] 05/21/2019 2221   PROTEINUR 100 (A) 05/21/2019 2221   NITRITE NEGATIVE 05/21/2019 2221   LEUKOCYTESUR MODERATE (A) 05/21/2019 2221   LEUKOCYTESUR Negative 05/10/2014 1344   Sepsis Labs Invalid input(s): PROCALCITONIN,  WBC,  LACTICIDVEN Microbiology Recent Results (from the past 240 hour(s))  SARS CORONAVIRUS 2 (TAT 6-24 HRS) Nasopharyngeal Nasopharyngeal Swab     Status: None   Collection Time: 05/22/19  6:55 AM   Specimen: Nasopharyngeal Swab  Result Value Ref Range Status   SARS Coronavirus 2 NEGATIVE NEGATIVE Final     Comment: (NOTE) SARS-CoV-2 target nucleic acids are NOT DETECTED. The SARS-CoV-2 RNA is generally detectable in upper and lower respiratory  specimens during the acute phase of infection. Negative results do not preclude SARS-CoV-2 infection, do not rule out co-infections with other pathogens, and should not be used as the sole basis for treatment or other patient management decisions. Negative results must be combined with clinical observations, patient history, and epidemiological information. The expected result is Negative. Fact Sheet for Patients: SugarRoll.be Fact Sheet for Healthcare Providers: https://www.woods-mathews.com/ This test is not yet approved or cleared by the [place] FDA and  has been authorized for detection and/or diagnosis of SARS-CoV-2 by FDA under an Emergency Use Authorization (EUA). This EUA will remain  in effect (meaning this test can be used) for the duration of the COVID-19 declaration under Section 56 4(b)(1) of the Act, 21 U.S.C. section 360bbb-3(b)(1), unless the authorization is terminated or revoked sooner. Performed at [facility], [place] [address], [place], [place] [address]   MRSA PCR Screening     Status: None   Collection Time: 05/23/19 12:57 AM   Specimen: Nasal Mucosa; Nasopharyngeal  Result Value Ref Range Status   MRSA by PCR NEGATIVE NEGATIVE Final    Comment:        The GeneXpert MRSA Assay (FDA approved for NASAL specimens only), is one component of a comprehensive MRSA colonization surveillance program. It is not intended to diagnose MRSA infection nor to guide or monitor treatment for MRSA infections. Performed at [facility], [address] [place], [place] [address]      Time coordinating discharge:  I have spent 35 minutes face to face with the patient and on the ward discussing the patients care, assessment, plan and disposition with other care givers.  >50% of the time was devoted counseling the patient about the risks and benefits of treatment/Discharge disposition and coordinating care.   SIGNED:   Damita Lack, MD  [facility] 05/24/2019, 10:04 AM   If 7PM-7AM, please contact night-coverage

## 2019-05-24 NOTE — Progress Notes (Signed)
HD Initiated:    05/23/19 2114  Vital Signs  Temp 98.4 F (36.9 C)  Temp Source Oral  Pulse Rate 69  Pulse Rate Source Dinamap  Resp 16  BP 122/81  BP Location Right Arm  BP Method Automatic  Patient Position (if appropriate) Lying  During Hemodialysis Assessment  Blood Flow Rate (mL/min) 400 mL/min  Arterial Pressure (mmHg) -120 mmHg  Venous Pressure (mmHg) 180 mmHg  Transmembrane Pressure (mmHg) 60 mmHg  Ultrafiltration Rate (mL/min) 330 mL/min  Dialysate Flow Rate (mL/min) 600 ml/min  Conductivity: Machine  14  HD Safety Checks Performed Yes  Intra-Hemodialysis Comments Tx initiated

## 2019-05-24 NOTE — Progress Notes (Signed)
Pre HD    05/23/19 2105  Vital Signs  Temp 98.4 F (36.9 C)  Temp Source Oral  Pulse Rate 65  Pulse Rate Source Dinamap  Resp 16  BP (!) 137/51  BP Location Right Arm  BP Method Automatic  Patient Position (if appropriate) Lying  Time-Out for Hemodialysis  What Procedure? HD   Pt Identifiers(min of two) First/Last Name;MRN/Account#;Pt's DOB(use if MRN/Acct# not available  Correct Site? Yes  Correct Side? Yes  Correct Procedure? Yes  Consents Verified? Yes  Rad Studies Available? N/A  Safety Precautions Reviewed? Yes  Engineer, civil (consulting) Number 5  Station Number 4  UF/Alarm Test Passed  Conductivity: Meter 14  Conductivity: Machine  14  pH 7.4  Reverse Osmosis main  Normal Saline Lot Number [id number]  Dialyzer Lot Number [id number]  Disposable Set Lot Number [id number]  Machine Temperature 98.6 F (37 C)  Musician and Audible Yes  Blood Lines Intact and Secured Yes  Pre Treatment Patient Checks  Vascular access used during treatment Fistula  Isolation Initiated Yes  Hepatitis B Surface Antibody  (unknown)  Hemodialysis Consent Verified Yes  ECG (Telemetry) Monitor On Yes  Prime Ordered Normal Saline  Length of  DialysisTreatment -hour(s) 3 Hour(s)  Dialyzer Elisio 17H NR  Dialysate 3K;2.5 Ca  Dialysate Flow Ordered 600  Blood Flow Rate Ordered 400 mL/min  Ultrafiltration Goal 0.5 Liters  Dialysis Blood Pressure Support Ordered Normal Saline  Education / Care Plan  Dialysis Education Provided Yes  Documented Education in Care Plan Yes  Outpatient Plan of Care Reviewed and on Chart Yes  Fistula / Graft Left Upper arm Arteriovenous fistula  Placement Date: 08/13/15   Placed prior to admission: Yes  Orientation: Left  Access Location: Upper arm  Access Type: Arteriovenous fistula  Site Condition No complications  Fistula / Graft Assessment Bruit;Thrill;Present  Status Accessed  Needle Size 15  Drainage Description None

## 2019-05-24 NOTE — Progress Notes (Signed)
HD Completed:    05/23/19 2315  Vital Signs  Temp 98.4 F (36.9 C)  Temp Source Oral  Pulse Rate 65  Pulse Rate Source Dinamap  Resp 16  BP (!) 118/43  BP Location Right Arm  BP Method Automatic  Patient Position (if appropriate) Lying  During Hemodialysis Assessment  KECN 32.8 KECN  Dialysis Fluid Bolus Normal Saline  Bolus Amount (mL) 0 mL  Intra-Hemodialysis Comments Tx completed

## 2019-05-24 NOTE — Progress Notes (Signed)
Dialysis RN notified of Epogen on [facility], prior to patient returning to room 221; Stated that she would "take care of it". Acknowledged. Barbaraann Faster, RN

## 2019-05-24 NOTE — NC FL2 (Addendum)
[place] LEVEL OF CARE SCREENING TOOL     IDENTIFICATION  Patient Name: Joyce [name] Birthdate: Sep 19, 1945 Sex: female Admission Date (Current Location): 05/22/2019  [place] and [place] Number:  Engineering geologist and Address:  [facility], [address] [place], [place] [address]      Provider Number: [id number]  Attending Physician Name and Address:  Damita Lack, MD  Relative Name and Phone Number:       Current Level of Care: Hospital Recommended Level of Care: [place] Prior Approval Number:    Date Approved/Denied:   PASRR Number:    Discharge Plan: Domiciliary (Rest home)    Current Diagnoses: Patient Active Problem List   Diagnosis Date Noted  . Renal hemorrhage, left 05/23/2019  . Renal cyst, native, hemorrhage 05/22/2019  . RLS (restless legs syndrome) 03/23/2019  . Insomnia due to medical condition 03/23/2019  . Gastro-esophageal reflux disease without esophagitis 06/29/2018  . Bipolar disorder, current episode mixed, mild ([place]) 06/29/2018  . Cardiac murmur, unspecified 06/29/2018  . Neurocognitive disorder 06/29/2018  . Other infective otitis externa, left ear 06/29/2018  . Vitamin B12 deficiency anemia 06/29/2018  . Suicide attempt ([place]) 03/09/2018  . Acute respiratory failure with hypoxia ([place]) 03/04/2018  . Bipolar I disorder, most recent episode depressed ([place]) 02/26/2018  . UTI (urinary tract infection) 12/16/2017  . Palliative care encounter   . Bipolar affective disorder, current episode depressed with psychotic symptoms ([place]) 09/17/2017  . Bipolar I disorder, most recent episode (or current) manic ([place]) 09/02/2017  . Acute delirium 09/02/2017  . Altered mental status   . Acute encephalopathy 09/01/2017  . ESRD on dialysis ([place]) 04/06/2017  . Complication of vascular access for dialysis 04/06/2017  . Hematuria 12/18/2016  . Polycystic kidney 10/12/2016  . Ruptured cyst of  kidney 10/03/2016  . HTN (hypertension) 10/01/2016  . Anemia associated with chronic renal failure 09/24/2016  . Noninfectious diarrhea   . Benign neoplasm of cecum   . Benign neoplasm of ascending colon   . Diarrhea   . Nausea   . Gastric polyp   . Chronic constipation 12/27/2014  . Nausea with vomiting 12/27/2014  . Left sided abdominal pain 12/27/2014  . Personal history of colonic polyps 05/10/2013    Orientation RESPIRATION BLADDER Height & Weight     Self, Time, Situation, Place  Normal Continent Weight: 147 lb 6.4 oz (66.9 kg) Height:  5\' 3"  (160 cm)  BEHAVIORAL SYMPTOMS/MOOD NEUROLOGICAL BOWEL NUTRITION STATUS  (none) (none) Continent Diet(Renal diet with 1293ml fluid restriction)  AMBULATORY STATUS COMMUNICATION OF NEEDS Skin   Supervision Verbally Normal                       Personal Care Assistance Level of Assistance  Bathing, Feeding, Dressing Bathing Assistance: Limited assistance Feeding assistance: Independent Dressing Assistance: Independent     Functional Limitations Info  Sight, Hearing, Speech Sight Info: Adequate Hearing Info: Adequate Speech Info: Adequate    SPECIAL CARE FACTORS FREQUENCY                       Contractures Contractures Info: Not present    Additional Factors Info  Code Status, Allergies Code Status Info: Full Code Allergies Info: Morphine And Related, Gabapentin, Indomethacin, Pollen Extract           Medication List    TAKE these medications   acetaminophen 325 MG tablet Commonly known as: TYLENOL  Take 650 mg by mouth every 6 (six) hours as needed for mild pain or fever.   b complex-vitamin c-folic acid 0.8 MG Tabs tablet Take 1 tablet by mouth daily.   brimonidine 0.2 % ophthalmic solution Commonly known as: ALPHAGAN Place 1 drop into both eyes 2 (two) times daily.   calcium acetate 667 MG capsule Commonly known as: PHOSLO Take 667 mg by mouth 3 (three) times daily with meals.    carbamazepine 100 MG chewable tablet Commonly known as: TEGRETOL Chew 100 mg by mouth 2 (two) times daily.   ciprofloxacin 500 MG tablet Commonly known as: Cipro Take 1 tablet (500 mg total) by mouth 2 (two) times daily for 5 days.   dicyclomine 20 MG tablet Commonly known as: BENTYL Take 20 mg by mouth 3 (three) times daily after meals.   famotidine 20 MG tablet Commonly known as: PEPCID Take 1 tablet (20 mg total) by mouth daily.   haloperidol 5 MG tablet Commonly known as: HALDOL Take 2.5 mg by mouth 3 (three) times a week. (Tuesday, Thursday and Saturday)   ipratropium 0.03 % nasal spray Commonly known as: ATROVENT Place 2 sprays into both nostrils 2 (two) times daily as needed for rhinitis.   latanoprost 0.005 % ophthalmic solution Commonly known as: XALATAN Place 1 drop into both eyes at bedtime.   lidocaine-prilocaine cream Commonly known as: EMLA Apply 1 application topically every Monday, Wednesday, and Friday with hemodialysis.   loratadine 10 MG tablet Commonly known as: CLARITIN Take 10 mg by mouth daily.   metoprolol tartrate 25 MG tablet Commonly known as: LOPRESSOR Take 1 tablet (25 mg total) by mouth 2 (two) times daily. What changed: additional instructions   midodrine 10 MG tablet Commonly known as: PROAMATINE Take 10 mg by mouth See admin instructions. Daily prn as needed on Dialysis days for SBP < 100   ondansetron 4 MG tablet Commonly known as: ZOFRAN Take 4 mg by mouth every 6 (six) hours as needed for nausea.   rOPINIRole 2 MG tablet Commonly known as: REQUIP Take 1 tablet (2 mg total) by mouth daily.   timolol 0.5 % ophthalmic solution Commonly known as: TIMOPTIC Place 1 drop into both eyes 2 (two) times daily.   traMADol 50 MG tablet Commonly known as: Ultram Take 1 tablet (50 mg total) by mouth every 6 (six) hours as needed.   traZODone 50 MG tablet Commonly known as: DESYREL Take 25 mg by mouth at bedtime.    vitamin C 250 MG tablet Commonly known as: ASCORBIC ACID Take 250 mg by mouth 2 (two) times daily.   ziprasidone 20 MG capsule Commonly known as: GEODON Take 20 mg by mouth at bedtime.      Discharge Medications: Please see discharge summary for a list of discharge medications.  Relevant Imaging Results:  Relevant Lab Results:   Additional Information    Renold Kozar  Louretta Shorten, LCSWA

## 2019-05-24 NOTE — Progress Notes (Signed)
[facility], [place] 05/24/19  Subjective:  Late entry. Patient was seen at bedside prior to discharge. Was feeling much better today. No hematuria today or yesterday. Still has some pain along her left rib cage.    Objective:  Vital signs in last 24 hours:  Temp:  [98.1 F (36.7 C)-98.6 F (37 C)] 98.3 F (36.8 C) (11/03 0453) Pulse Rate:  [65-71] 71 (11/03 0453) Resp:  [16-18] 16 (11/03 0453) BP: (118-138)/(43-81) 127/54 (11/03 0453) SpO2:  [94 %-96 %] 94 % (11/03 0453)  Weight change:  Filed Weights   05/21/19 2218 05/22/19 2158  Weight: 69.4 kg 66.9 kg    Intake/Output:    Intake/Output Summary (Last 24 hours) at 05/24/2019 1418 Last data filed at 05/24/2019 0700 Gross per 24 hour  Intake 220 ml  Output 1400 ml  Net -1180 ml     Physical Exam: General:  No acute distress, laying in the bed  HEENT  moist oral mucous membranes  Pulm/lungs  room air, clear to auscultation  CVS/Heart  regular, no rub   Abdomen:   Soft, left flank tenderness  Extremities:  No edema  Neurologic:  Alert, oriented  Skin:  No acute rashes  Access:  Left arm AV fistula       Basic Metabolic Panel:  Recent Labs  Lab 05/20/19 1131 05/22/19 0359 05/23/19 0421 05/23/19 0513 05/24/19 0513  NA 136 135 135  --  137  K 3.5 4.3 4.7  --  4.3  CL 99 97* 100  --  100  CO2 28 24 24   --  27  GLUCOSE 114* 100* 94  --  96  BUN 11 33* 44*  --  28*  CREATININE 2.05* 5.29* 6.10*  --  4.46*  CALCIUM 8.9 9.0 8.1*  --  8.3*  MG  --   --  1.7  --  1.8  PHOS  --   --   --  4.1  --      CBC: Recent Labs  Lab 05/20/19 1131 05/21/19 2222 05/22/19 0359 05/22/19 0818 05/22/19 1710 05/23/19 0421 05/24/19 0513  WBC 8.9 8.5  --   --  6.7 7.0 6.4  NEUTROABS 7.4  --   --   --   --   --   --   HGB 10.1* 9.4* 9.3* 8.9* 8.6* 8.3* 8.3*  HCT 29.9* 28.4* 28.3* 26.9* 25.5* 24.3* 24.5*  MCV 97.4 99.6  --   --  99.2 98.0 98.4  PLT 210 215  --   --  203 191 202       Lab Results  Component Value Date   HEPBSAG Negative 12/16/2017   HEPBSAB Non Reactive 09/18/2017      Microbiology:  Recent Results (from the past 240 hour(s))  SARS CORONAVIRUS 2 (TAT 6-24 HRS) Nasopharyngeal Nasopharyngeal Swab     Status: None   Collection Time: 05/22/19  6:55 AM   Specimen: Nasopharyngeal Swab  Result Value Ref Range Status   SARS Coronavirus 2 NEGATIVE NEGATIVE Final    Comment: (NOTE) SARS-CoV-2 target nucleic acids are NOT DETECTED. The SARS-CoV-2 RNA is generally detectable in upper and lower respiratory specimens during the acute phase of infection. Negative results do not preclude SARS-CoV-2 infection, do not rule out co-infections with other pathogens, and should not be used as the sole basis for treatment or other patient management decisions. Negative results must be combined with clinical observations, patient history, and epidemiological information. The expected result is Negative.  Fact Sheet for Patients: SugarRoll.be Fact Sheet for Healthcare Providers: https://www.woods-mathews.com/ This test is not yet approved or cleared by the [place] FDA and  has been authorized for detection and/or diagnosis of SARS-CoV-2 by FDA under an Emergency Use Authorization (EUA). This EUA will remain  in effect (meaning this test can be used) for the duration of the COVID-19 declaration under Section 56 4(b)(1) of the Act, 21 U.S.C. section 360bbb-3(b)(1), unless the authorization is terminated or revoked sooner. Performed at [facility], [place] [address], [place], [place] [address]   MRSA PCR Screening     Status: None   Collection Time: 05/23/19 12:57 AM   Specimen: Nasal Mucosa; Nasopharyngeal  Result Value Ref Range Status   MRSA by PCR NEGATIVE NEGATIVE Final    Comment:        The GeneXpert MRSA Assay (FDA approved for NASAL specimens only), is one component of a comprehensive MRSA  colonization surveillance program. It is not intended to diagnose MRSA infection nor to guide or monitor treatment for MRSA infections. Performed at [facility], [address] [place], [place] [address]     Coagulation Studies: No results for input(s): LABPROT, INR in the last 72 hours.  Urinalysis: Recent Labs    05/21/19 2221  COLORURINE AMBER*  LABSPEC 1.005  PHURINE 8.0  GLUCOSEU NEGATIVE  HGBUR LARGE*  BILIRUBINUR NEGATIVE  KETONESUR NEGATIVE  PROTEINUR 100*  NITRITE NEGATIVE  LEUKOCYTESUR MODERATE*      Imaging: No results found.   Medications:   . cefTRIAXone (ROCEPHIN)  IV Stopped (05/23/19 1246)   . brimonidine  1 drop Both Eyes BID  . carbamazepine  100 mg Oral BID  . Chlorhexidine Gluconate Cloth  6 each Topical Q0600  . dicyclomine  20 mg Oral TID AC  . epoetin (EPOGEN/PROCRIT) injection  4,000 Units Intravenous Q M,W,F-HD  . famotidine  20 mg Oral Daily  . haloperidol  2.5 mg Oral Q M,W,F  . latanoprost  1 drop Both Eyes QHS  . multivitamin  1 tablet Oral Daily  . pantoprazole  40 mg Oral Daily  . rOPINIRole  2 mg Oral Daily  . timolol  1 drop Both Eyes BID  . traZODone  25 mg Oral QHS  . ziprasidone  20 mg Oral QHS   acetaminophen **OR** acetaminophen, ipratropium, ondansetron **OR** ondansetron (ZOFRAN) IV, oxyCODONE, traMADol  Assessment/ Plan:  73 y.o. female with end-stage renal disease on hemodialysis, polycystic kidney disease, history of bipolar disorder, admitted for hemorrhagic left cyst.  Principal Problem:   Renal cyst, native, hemorrhage Active Problems:   Left sided abdominal pain   Anemia associated with chronic renal failure   Polycystic kidney   Hematuria   ESRD on dialysis ([place])   Bipolar I disorder, most recent episode (or current) manic (HCC)   RLS (restless legs syndrome)   Renal hemorrhage, left  [facility] MWF-1  TW 69kg left arm AV fistula  #. ESRD -Patient completed dialysis yesterday.   Tolerated well.  No acute indication for dialysis today.  She will resume normal outpatient schedule tomorrow.   #. Anemia of CKD  Lab Results  Component Value Date   HGB 8.3 (L) 05/24/2019  Maintain the patient on Epogen as an outpatient.  #. SHPTH  No results found for: PTH Lab Results  Component Value Date   PHOS 4.1 05/23/2019  Serum phosphorus was 4.1 yesterday and at target.  Continue to monitor bone mineral metabolism parameters as an outpatient.   #.  Polycystic kidney disease with left kidney hemorrhagic cyst. -Continue to monitor hemoglobin closely as an outpatient.  No hematuria today or yesterday.    LOS: 1 Syair Fricker 11/3/20202:18 PM  [employer], [place]

## 2019-05-24 NOTE — TOC Initial Note (Signed)
Transition of Care [facility]) - Initial/Assessment Note    Patient Details  Name: Joyce [name] MRN: [id number] Date of Birth: [birth year]  Transition of Care [facility]) CM/SW Contact:    Annamaria Boots, [place] Phone Number: 05/24/2019, 11:59 AM  Clinical Narrative:    Patient is at high risk for readmission. CSW met with patient to discuss discharge planning. Patient reports that she lives at [facility] living and has been there since January 2020. Patient states that she lives alone and has transportation provided by her cousin. Patient reports that she has no DME needs and walks independently. Patient states that she sees the in house physician and receives her meds at the facility. CSW notified [facility] of patient admission and potential discharge today. CSW received call from [place] at [facility] stating patient will need orders for PT and OT. CSW notified MD of need. Patient will be transported by family when ready for discharge.                Expected Discharge Plan: Assisted Living Barriers to Discharge: No Barriers Identified   Patient Goals and CMS Choice Patient states their goals for this hospitalization and ongoing recovery are:: Return to [facility].gov Compare Post Acute Care list provided to:: Patient Choice offered to / list presented to : Patient  Expected Discharge Plan and Services Expected Discharge Plan: Assisted Living       Living arrangements for the past 2 months: [place] Expected Discharge Date: 05/24/19                         HH Arranged: PT, OT HH Agency: [facility] Date [facility] Agency Contacted: 05/24/19   Representative spoke with at [place]: [place] Arrangements/Services Living arrangements for the past 2 months: [place] Lives with:: Self, Facility Resident Patient language and need for interpreter reviewed:: Yes Do you feel safe going back to the place where you  live?: Yes      Need for Family Participation in Patient Care: No (Comment) Care giver support system in place?: No (comment)   Criminal Activity/Legal Involvement Pertinent to Current Situation/Hospitalization: No - Comment as needed  Activities of Daily Living Home Assistive Devices/Equipment: None ADL Screening (condition at time of admission) Patient's cognitive ability adequate to safely complete daily activities?: Yes Is the patient deaf or have difficulty hearing?: Yes Does the patient have difficulty seeing, even when wearing glasses/contacts?: Yes(left eye blind) Does the patient have difficulty concentrating, remembering, or making decisions?: No Patient able to express need for assistance with ADLs?: Yes Does the patient have difficulty dressing or bathing?: No Independently performs ADLs?: Yes (appropriate for developmental age) Does the patient have difficulty walking or climbing stairs?: No Weakness of Legs: None Weakness of Arms/Hands: None  Permission Sought/Granted Permission sought to share information with : Case Manager, Customer service manager, Family Supports Permission granted to share information with : Yes, Verbal Permission Granted              Emotional Assessment Appearance:: Appears stated age Attitude/Demeanor/Rapport: Engaged Affect (typically observed): Pleasant, Happy Orientation: : Oriented to Self, Oriented to  Time, Oriented to Place, Oriented to Situation Alcohol / Substance Use: Not Applicable Psych Involvement: No (comment)  Admission diagnosis:  Renal cyst, native, hemorrhage [N28.89, N28.1] Patient Active Problem List   Diagnosis Date Noted  . Renal hemorrhage, left 05/23/2019  . Renal cyst, native, hemorrhage 05/22/2019  .  RLS (restless legs syndrome) 03/23/2019  . Insomnia due to medical condition 03/23/2019  . Gastro-esophageal reflux disease without esophagitis 06/29/2018  . Bipolar disorder, current episode mixed, mild  ([place]) 06/29/2018  . Cardiac murmur, unspecified 06/29/2018  . Neurocognitive disorder 06/29/2018  . Other infective otitis externa, left ear 06/29/2018  . Vitamin B12 deficiency anemia 06/29/2018  . Suicide attempt ([place]) 03/09/2018  . Acute respiratory failure with hypoxia ([place]) 03/04/2018  . Bipolar I disorder, most recent episode depressed ([place]) 02/26/2018  . UTI (urinary tract infection) 12/16/2017  . Palliative care encounter   . Bipolar affective disorder, current episode depressed with psychotic symptoms ([place]) 09/17/2017  . Bipolar I disorder, most recent episode (or current) manic ([place]) 09/02/2017  . Acute delirium 09/02/2017  . Altered mental status   . Acute encephalopathy 09/01/2017  . ESRD on dialysis ([place]) 04/06/2017  . Complication of vascular access for dialysis 04/06/2017  . Hematuria 12/18/2016  . Polycystic kidney 10/12/2016  . Ruptured cyst of kidney 10/03/2016  . HTN (hypertension) 10/01/2016  . Anemia associated with chronic renal failure 09/24/2016  . Noninfectious diarrhea   . Benign neoplasm of cecum   . Benign neoplasm of ascending colon   . Diarrhea   . Nausea   . Gastric polyp   . Chronic constipation 12/27/2014  . Nausea with vomiting 12/27/2014  . Left sided abdominal pain 12/27/2014  . Personal history of colonic polyps 05/10/2013   PCP:  [facility]:   [place], [place] - [address] [place] [address] Phone: [phone] Fax: [phone]     Social Determinants of Health ([place]) Interventions    Readmission Risk Interventions Readmission Risk Prevention Plan 05/24/2019  Transportation Screening Complete  PCP or Specialist Appt within 3-5 Days Complete  HRI or [place] Complete  Social Work Consult for [place] Planning/Counseling Not Complete  SW consult not completed comments NA  Palliative Care Screening Not Applicable  Medication  Review (RN Transport planner) Referral to Pharmacy  Some recent data might be hidden

## 2019-07-18 ENCOUNTER — Other Ambulatory Visit: Payer: Self-pay

## 2019-07-18 ENCOUNTER — Ambulatory Visit (INDEPENDENT_AMBULATORY_CARE_PROVIDER_SITE_OTHER): Payer: Medicare HMO

## 2019-07-18 ENCOUNTER — Ambulatory Visit (INDEPENDENT_AMBULATORY_CARE_PROVIDER_SITE_OTHER): Payer: Medicare HMO | Admitting: Vascular Surgery

## 2019-07-18 DIAGNOSIS — N186 End stage renal disease: Secondary | ICD-10-CM

## 2019-07-18 DIAGNOSIS — Z992 Dependence on renal dialysis: Secondary | ICD-10-CM

## 2019-08-17 ENCOUNTER — Encounter (INDEPENDENT_AMBULATORY_CARE_PROVIDER_SITE_OTHER): Payer: Self-pay | Admitting: Vascular Surgery

## 2019-09-27 ENCOUNTER — Other Ambulatory Visit: Payer: Self-pay

## 2019-09-27 ENCOUNTER — Ambulatory Visit (INDEPENDENT_AMBULATORY_CARE_PROVIDER_SITE_OTHER): Payer: Medicare HMO | Admitting: Cardiology

## 2019-09-27 ENCOUNTER — Encounter: Payer: Self-pay | Admitting: Cardiology

## 2019-09-27 VITALS — BP 113/58 | HR 50 | Ht 63.0 in | Wt 148.0 lb

## 2019-09-27 DIAGNOSIS — R55 Syncope and collapse: Secondary | ICD-10-CM

## 2019-09-27 DIAGNOSIS — Z8679 Personal history of other diseases of the circulatory system: Secondary | ICD-10-CM

## 2019-09-27 DIAGNOSIS — I951 Orthostatic hypotension: Secondary | ICD-10-CM

## 2019-09-27 MED ORDER — MIDODRINE HCL 5 MG PO TABS: 5.0000 mg | ORAL_TABLET | Freq: Three times a day (TID) | ORAL | 1 refills | Status: DC

## 2019-09-27 NOTE — Patient Instructions (Signed)
Medication Instructions:  Your physician has recommended you make the following change in your medication:  1- STOP Lopressor. 2- CHANGE Midodrine to 5 mg by mouth three times a day.  *If you need a refill on your cardiac medications before your next appointment, please call your pharmacy*   Lab Work: none If you have labs (blood work) drawn today and your tests are completely normal, you will receive your results only by: Marland Kitchen MyChart Message (if you have MyChart) OR . A paper copy in the mail If you have any lab test that is abnormal or we need to change your treatment, we will call you to review the results.   Testing/Procedures: none  Follow-Up: At [facility], you and your health needs are our priority.  As part of our continuing mission to provide you with exceptional heart care, we have created designated Provider Care Teams.  These Care Teams include your primary Cardiologist (physician) and Advanced Practice Providers (APPs -  Physician Assistants and Nurse Practitioners) who all work together to provide you with the care you need, when you need it.  We recommend signing up for the patient portal called "MyChart".  Sign up information is provided on this After Visit Summary.  MyChart is used to connect with patients for Virtual Visits (Telemedicine).  Patients are able to view lab/test results, encounter notes, upcoming appointments, etc.  Non-urgent messages can be sent to your provider as well.   To learn more about what you can do with MyChart, go to [email or web address].    Your next appointment:   1 month(s)  The format for your next appointment:   In Person  Provider:   Kate Sable, MD

## 2019-09-27 NOTE — Progress Notes (Signed)
Cardiology Office Note:    Date:  09/27/2019   ID:  Joyce [name], DOB [birth year], MRN [id number]  PCP:  [facility]  Cardiologist:  Kate Sable, MD  Electrophysiologist:  None   Referring MD: Anthonette Legato, MD   Chief Complaint  Patient presents with  . New Patient (Initial Visit)    Bradycardia/syncope/dizziness; Meds verbally reviewed with patient.    History of Present Illness:    Joyce [name] is a 74 y.o. female with a hx of hypertension, end-stage renal disease on dialysis MWF, who presents due to bradycardia, dizziness, syncope.  Patient states having symptoms of nausea, dizziness during dialysis sessions.  She has passed out 2 times so far after dialysis sessions.  Last time was about 2 weeks ago when she was weighing herself.  Previous was roughly 3 months ago when she stood up to walk to the bathroom after getting dialyzed.  She states her blood pressure usually drops during dialysis sessions.  She has been told to take midodrine 10 mg as needed prior to starting dialysis but this has not helped.  She takes metoprolol 25 mg twice daily, does not know why she takes it.  In the office today, orthostatic vitals were consistent with orthostasis with blood pressure dropping roughly 27 points from sitting to standing.  This was associated with dizziness.  Past Medical History:  Diagnosis Date  . Anal fissure   . Bipolar affective disorder ([place])   . CKD (chronic kidney disease)    Dr [facility] Raring [name] 4  . Colon polyps   . Diverticulitis   . Diverticulitis   . Family history of adverse reaction to anesthesia    mom - PONV  . GERD (gastroesophageal reflux disease)   . Headache    migraines - none over 10 yrs  . Heart murmur   . History of hiatal hernia   . Hypertension   . Pancreatitis    Valproic acid  . Vertigo     Past Surgical History:  Procedure Laterality Date  . A/V FISTULAGRAM Left 04/14/2017   Procedure: A/V Fistulagram;  Surgeon:  Katha Cabal, MD;  Location: [facility] [place] [facility];  Service: Cardiovascular;  Laterality: Left;  . A/V FISTULAGRAM Left 06/09/2017   Procedure: A/V FISTULAGRAM;  Surgeon: Katha Cabal, MD;  Location: [place] [facility];  Service: Cardiovascular;  Laterality: Left;  . A/V FISTULAGRAM Left 11/02/2017   Procedure: A/V FISTULAGRAM;  Surgeon: Algernon Huxley, MD;  Location: [place] [facility];  Service: Cardiovascular;  Laterality: Left;  . A/V FISTULAGRAM Left 12/22/2018   Procedure: A/V FISTULAGRAM;  Surgeon: Katha Cabal, MD;  Location: [place] [facility];  Service: Cardiovascular;  Laterality: Left;  . A/V SHUNT INTERVENTION N/A 11/02/2017   Procedure: A/V SHUNT INTERVENTION;  Surgeon: Algernon Huxley, MD;  Location: [place] [facility];  Service: Cardiovascular;  Laterality: N/A;  . ABDOMINAL HYSTERECTOMY  1990 ?  . AV FISTULA PLACEMENT  4/30  . BREAST EXCISIONAL BIOPSY Left 1994   neg surgical bx  . CHOLECYSTECTOMY  2003  . COLONOSCOPY  2014   Dr. Jamal Collin  . COLONOSCOPY WITH PROPOFOL N/A 09/24/2015   Procedure: COLONOSCOPY WITH random colon byopies.;  Surgeon: Lucilla Lame, MD;  Location: [place];  Service: Endoscopy;  Laterality: N/A;  . ESOPHAGOGASTRODUODENOSCOPY (EGD) WITH PROPOFOL N/A 09/24/2015   Procedure: ESOPHAGOGASTRODUODENOSCOPY (EGD) ;  Surgeon: Lucilla Lame, MD;  Location: [place];  Service: Endoscopy;  Laterality: N/A;  .  EYE SURGERY    . PERIPHERAL VASCULAR CATHETERIZATION N/A 05/29/2015   Procedure: A/V Shuntogram/Fistulagram;  Surgeon: Katha Cabal, MD;  Location: [facility];  Service: Cardiovascular;  Laterality: N/A;  . PERIPHERAL VASCULAR CATHETERIZATION N/A 05/29/2015   Procedure: A/V Shunt Intervention;  Surgeon: Katha Cabal, MD;  Location: [place] [facility];  Service: Cardiovascular;  Laterality: N/A;  . POLYPECTOMY  09/24/2015   Procedure: POLYPECTOMY INTESTINAL;  Surgeon: Lucilla Lame, MD;  Location: [place];  Service: Endoscopy;;  cecal polyp ascending polyp    Current Medications: Current Meds  Medication Sig  . acetaminophen (TYLENOL) 325 MG tablet Take 500 mg by mouth every 6 (six) hours as needed for mild pain or fever.   . brimonidine (ALPHAGAN) 0.2 % ophthalmic solution Place 1 drop into both eyes 2 (two) times daily.  . calcium acetate (PHOSLO) 667 MG capsule Take 667 mg by mouth 3 (three) times daily with meals.   . carbamazepine (TEGRETOL) 100 MG chewable tablet Chew 100 mg by mouth 2 (two) times daily.  . cyanocobalamin 1000 MCG tablet Take 1,000 mcg by mouth daily.  Marland Kitchen dicyclomine (BENTYL) 20 MG tablet Take 20 mg by mouth 3 (three) times daily after meals.  . famotidine (PEPCID) 20 MG tablet Take 1 tablet (20 mg total) by mouth daily.  . haloperidol (HALDOL) 5 MG tablet Take 2.5 mg by mouth 3 (three) times a week. (Tuesday, Thursday and Saturday)  . ipratropium (ATROVENT) 0.03 % nasal spray Place 2 sprays into both nostrils 2 (two) times daily as needed for rhinitis.  Marland Kitchen lidocaine-prilocaine (EMLA) cream Apply 1 application topically every Monday, Wednesday, and Friday with hemodialysis.   Marland Kitchen loratadine (CLARITIN) 10 MG tablet Take 10 mg by mouth daily.  . Netarsudil-Latanoprost (ROCKLATAN) 0.02-0.005 % SOLN Apply 1 drop to eye at bedtime.  . ondansetron (ZOFRAN) 4 MG tablet Take 4 mg by mouth every 6 (six) hours as needed for nausea.   Marland Kitchen rOPINIRole (REQUIP) 2 MG tablet Take 1 tablet (2 mg total) by mouth daily.  . timolol (TIMOPTIC) 0.5 % ophthalmic solution Place 1 drop into both eyes 2 (two) times daily.  . traZODone (DESYREL) 50 MG tablet Take 25 mg by mouth at bedtime.  . vitamin C (ASCORBIC ACID) 250 MG tablet Take 250 mg by mouth 2 (two) times daily.  . ziprasidone (GEODON) 20 MG capsule Take 20 mg by mouth at bedtime.  . [DISCONTINUED] metoprolol tartrate (LOPRESSOR) 25 MG tablet Take 1 tablet (25 mg total) by mouth 2 (two) times daily. (Patient taking differently: Take 25  mg by mouth 2 (two) times daily. (Hold morning dose on dialysis days))  . [DISCONTINUED] midodrine (PROAMATINE) 10 MG tablet Take 10 mg by mouth See admin instructions. Daily prn as needed on Dialysis days for SBP < 100     Allergies:   Morphine and related, Gabapentin, Indomethacin, and Pollen extract   Social History   Socioeconomic History  . Marital status: Divorced    Spouse name: Not on file  . Number of children: 1  . Years of education: Not on file  . Highest education level: Not on file  Occupational History  . 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 Smoking status: Never Smoker  . Smokeless tobacco: Never Used  Substance and Sexual Activity  . Alcohol use: No    Alcohol/week: 0.0 standard drinks  . Drug use: No  . Sexual  activity: Not Currently  Other Topics Concern  . 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   Social Determinants of Health   Financial Resource Strain:   . Difficulty of Paying Living Expenses: Not on file  Food Insecurity: No Food Insecurity  . Worried About Charity fundraiser in the Last Year: Never true  . Ran Out of Food in the Last Year: Never true  Transportation Needs: No Transportation Needs  . Lack of Transportation (Medical): No  . Lack of Transportation (Non-Medical): No  Physical Activity: Unknown  . Days of Exercise per Week: 7 days  . Minutes of Exercise per Session: Not on file  Stress:   . Feeling of Stress : Not on file  Social Connections: Unknown  . Frequency of Communication with Friends and Family: More than three times a week  . Frequency of Social Gatherings with Friends and Family: Not on file  . Attends Religious Services: Not on file  . Active Member of Clubs or Organizations: Not on file  . Attends Archivist Meetings: Not on file  . Marital Status: Divorced     Family  History: The patient's family history includes Breast cancer in an other family member; Hypertension in her father; Stroke in her father. There is no history of Colon cancer, Liver disease, or Mental illness.  ROS:   Please see the history of present illness.     All other systems reviewed and are negative.  EKGs/Labs/Other Studies Reviewed:    The following studies were reviewed today:   EKG:  EKG is  ordered today.  The ekg ordered today demonstrates sinus bradycardia, heart rate 50.  Recent Labs: 05/24/2019: ALT 10; BUN 28; Creatinine, Ser 4.46; Hemoglobin 8.3; Magnesium 1.8; Platelets 202; Potassium 4.3; Sodium 137  Recent Lipid Panel    Component Value Date/Time   CHOL 251 (H) 09/18/2017 0646   CHOL 105 05/14/2014 0412   TRIG 124 09/18/2017 0646   TRIG 127 05/14/2014 0412   HDL 66 09/18/2017 0646   HDL 37 (L) 05/14/2014 0412   CHOLHDL 3.8 09/18/2017 0646   VLDL 25 09/18/2017 0646   VLDL 25 05/14/2014 0412   LDLCALC 160 (H) 09/18/2017 0646   LDLCALC 43 05/14/2014 0412    Physical Exam:    VS:  BP (!) 113/58 (BP Location: Right Arm, Patient Position: Sitting, Cuff Size: Normal)   Pulse (!) 50   Ht 5\' 3"  (1.6 m)   Wt 148 lb (67.1 kg)   SpO2 96%   BMI 26.22 kg/m     Wt Readings from Last 3 Encounters:  09/27/19 148 lb (67.1 kg)  05/22/19 147 lb 6.4 oz (66.9 kg)  05/20/19 153 lb (69.4 kg)     GEN:  Well nourished, well developed in no acute distress HEENT: Normal NECK: No JVD; No carotid bruits LYMPHATICS: No lymphadenopathy CARDIAC: RRR, systolic murmur noted on exam, consistent with AV fistula present RESPIRATORY:  Clear to auscultation without rales, wheezing or rhonchi  ABDOMEN: Soft, non-tender, non-distended MUSCULOSKELETAL:  No edema; left arm AV fistula noted SKIN: Warm and dry NEUROLOGIC:  Alert and oriented x 3 PSYCHIATRIC:  Normal affect   ASSESSMENT:    1. Syncope, unspecified syncope type   2. Orthostatic hypotension   3. Hx of sinus  bradycardia    PLAN:      1. Patient with history of passing out after  dialysis sessions.  She states blood pressure is typically low during and after sessions.  Orthostatic vitals in the office today are consistent with orthostasis.  It is also possible that a lot of fluid is taken out during dialysis sessions.  Stop Lopressor.  Start midodrine 5 mg 3 times daily.  Recommend patient discuss with nephrologist regarding limiting amount of volume taken out.  If symptoms still present at follow-up visit, we will plan to up titrate midodrine. 2. Patient has a history of sinus bradycardia.  Heart rate 50.  She takes Lopressor 25 mg twice daily.  Beta-blocker is contributing to orthostasis and bradycardia.  Stop Lopressor.  Follow-up in 1 month  This note was generated in part or whole with voice recognition software. Voice recognition is usually quite accurate but there are transcription errors that can and very often do occur. I apologize for any typographical errors that were not detected and corrected.  Medication Adjustments/Labs and Tests Ordered: Current medicines are reviewed at length with the patient today.  Concerns regarding medicines are outlined above.  Orders Placed This Encounter  Procedures  . EKG 12-Lead   Meds ordered this encounter  Medications  . midodrine (PROAMATINE) 5 MG tablet    Sig: Take 1 tablet (5 mg total) by mouth 3 (three) times daily with meals.    Dispense:  270 tablet    Refill:  1    Patient Instructions  Medication Instructions:  Your physician has recommended you make the following change in your medication:  1- STOP Lopressor. 2- CHANGE Midodrine to 5 mg by mouth three times a day.  *If you need a refill on your cardiac medications before your next appointment, please call your pharmacy*   Lab Work: none If you have labs (blood work) drawn today and your tests are completely normal, you will receive your results only by: Marland Kitchen MyChart Message (if  you have MyChart) OR . A paper copy in the mail If you have any lab test that is abnormal or we need to change your treatment, we will call you to review the results.   Testing/Procedures: none  Follow-Up: At [facility], you and your health needs are our priority.  As part of our continuing mission to provide you with exceptional heart care, we have created designated Provider Care Teams.  These Care Teams include your primary Cardiologist (physician) and Advanced Practice Providers (APPs -  Physician Assistants and Nurse Practitioners) who all work together to provide you with the care you need, when you need it.  We recommend signing up for the patient portal called "MyChart".  Sign up information is provided on this After Visit Summary.  MyChart is used to connect with patients for Virtual Visits (Telemedicine).  Patients are able to view lab/test results, encounter notes, upcoming appointments, etc.  Non-urgent messages can be sent to your provider as well.   To learn more about what you can do with MyChart, go to [email or web address].    Your next appointment:   1 month(s)  The format for your next appointment:   In Person  Provider:   Kate Sable, MD     Signed, Kate Sable, MD  09/27/2019 10:41 AM    [place]

## 2019-11-03 ENCOUNTER — Ambulatory Visit (INDEPENDENT_AMBULATORY_CARE_PROVIDER_SITE_OTHER): Payer: Medicare HMO | Admitting: Cardiology

## 2019-11-03 ENCOUNTER — Other Ambulatory Visit: Payer: Self-pay

## 2019-11-03 ENCOUNTER — Encounter: Payer: Self-pay | Admitting: Cardiology

## 2019-11-03 VITALS — BP 180/60 | HR 52 | Ht 63.0 in | Wt 146.2 lb

## 2019-11-03 DIAGNOSIS — R030 Elevated blood-pressure reading, without diagnosis of hypertension: Secondary | ICD-10-CM

## 2019-11-03 DIAGNOSIS — Z8679 Personal history of other diseases of the circulatory system: Secondary | ICD-10-CM

## 2019-11-03 DIAGNOSIS — I951 Orthostatic hypotension: Secondary | ICD-10-CM | POA: Diagnosis not present

## 2019-11-03 MED ORDER — MIDODRINE HCL 5 MG PO TABS: 5.0000 mg | ORAL_TABLET | Freq: Two times a day (BID) | ORAL | 3 refills | Status: AC

## 2019-11-03 NOTE — Progress Notes (Signed)
Cardiology Office Note:    Date:  11/03/2019   ID:  Joyce [name], Joyce [name] 07/19/1946, MRN [id number]  PCP:  Orvis Brill, [facility]  Cardiologist:  Kate Sable, MD  Electrophysiologist:  None   Referring MD: Living, Mariel Craft As*   Chief Complaint  Patient presents with  . other    1 month f/u c/o vomiting when taken calcium pill. Meds reviewed verbally with pt.    History of Present Illness:    Joyce [name] is a 74 y.o. female with a hx of hypertension, end-stage renal disease on dialysis MWF, who presents for follow-up.  She was last seen due to bradycardia, dizziness, syncope.  Patient with syncopal symptoms after dialysis sessions, with blood pressure dropping.  Orthostatic vitals after last visit were consistent with orthostasis.  Midodrine 5 mg 3 times daily was started.  She was on beta-blocker metoprolol 25 twice daily.  Beta-blocker was stopped after last visit.  She denies any further syncopal/passing out episodes after dialysis.  Systolic blood pressure running in the 180s.   Past Medical History:  Diagnosis Date  . Anal fissure   . Bipolar affective disorder ([place])   . CKD (chronic kidney disease)    Dr Holley Raring [name] 4  . Colon polyps   . Diverticulitis   . Diverticulitis   . Family history of adverse reaction to anesthesia    mom - PONV  . GERD (gastroesophageal reflux disease)   . Headache    migraines - none over 10 yrs  . Heart murmur   . History of hiatal hernia   . Hypertension   . Pancreatitis    Valproic acid  . Vertigo     Past Surgical History:  Procedure Laterality Date  . A/V FISTULAGRAM Left 04/14/2017   Procedure: A/V Fistulagram;  Surgeon: Katha Cabal, MD;  Location: [facility];  Service: Cardiovascular;  Laterality: Left;  . A/V FISTULAGRAM Left 06/09/2017   Procedure: A/V FISTULAGRAM;  Surgeon: Katha Cabal, MD;  Location: [place] [facility];  Service: Cardiovascular;  Laterality: Left;  . A/V  FISTULAGRAM Left 11/02/2017   Procedure: A/V FISTULAGRAM;  Surgeon: Algernon Huxley, MD;  Location: [place] [facility];  Service: Cardiovascular;  Laterality: Left;  . A/V FISTULAGRAM Left 12/22/2018   Procedure: A/V FISTULAGRAM;  Surgeon: Katha Cabal, MD;  Location: [place] [facility];  Service: Cardiovascular;  Laterality: Left;  . A/V SHUNT INTERVENTION N/A 11/02/2017   Procedure: A/V SHUNT INTERVENTION;  Surgeon: Algernon Huxley, MD;  Location: [place] [facility];  Service: Cardiovascular;  Laterality: N/A;  . ABDOMINAL HYSTERECTOMY  1990 ?  . AV FISTULA PLACEMENT  4/30  . BREAST EXCISIONAL BIOPSY Left 1994   neg surgical bx  . CHOLECYSTECTOMY  2003  . COLONOSCOPY  2014   Dr. Jamal Collin  . COLONOSCOPY WITH PROPOFOL N/A 09/24/2015   Procedure: COLONOSCOPY WITH random colon byopies.;  Surgeon: Lucilla Lame, MD;  Location: [facility];  Service: Endoscopy;  Laterality: N/A;  . ESOPHAGOGASTRODUODENOSCOPY (EGD) WITH PROPOFOL N/A 09/24/2015   Procedure: ESOPHAGOGASTRODUODENOSCOPY (EGD) ;  Surgeon: Lucilla Lame, MD;  Location: [place];  Service: Endoscopy;  Laterality: N/A;  . EYE SURGERY    . PERIPHERAL VASCULAR CATHETERIZATION N/A 05/29/2015   Procedure: A/V Shuntogram/Fistulagram;  Surgeon: Katha Cabal, MD;  Location: [facility] [facility];  Service: Cardiovascular;  Laterality: N/A;  . PERIPHERAL VASCULAR CATHETERIZATION N/A 05/29/2015   Procedure: A/V Shunt Intervention;  Surgeon: Dolores Lory  Schnier, MD;  Location: [facility] [facility];  Service: Cardiovascular;  Laterality: N/A;  . POLYPECTOMY  09/24/2015   Procedure: POLYPECTOMY INTESTINAL;  Surgeon: Lucilla Lame, MD;  Location: [name];  Service: Endoscopy;;  cecal polyp ascending polyp    Current Medications: Current Meds  Medication Sig  . acetaminophen (TYLENOL) 325 MG tablet Take 500 mg by mouth every 6 (six) hours as needed for mild pain or fever.   Marland Kitchen b complex-vitamin c-folic acid (NEPHRO-VITE) 0.8 MG  TABS tablet Take 1 tablet by mouth daily.  . brimonidine (ALPHAGAN) 0.2 % ophthalmic solution Place 1 drop into both eyes 2 (two) times daily.  . calcium acetate (PHOSLO) 667 MG capsule Take 667 mg by mouth 3 (three) times daily with meals.   . carbamazepine (TEGRETOL) 100 MG chewable tablet Chew 100 mg by mouth 2 (two) times daily.  . cyanocobalamin 1000 MCG tablet Take 1,000 mcg by mouth daily.  Marland Kitchen dicyclomine (BENTYL) 20 MG tablet Take 20 mg by mouth 3 (three) times daily after meals.  . famotidine (PEPCID) 20 MG tablet Take 1 tablet (20 mg total) by mouth daily.  . fluticasone (FLONASE) 50 MCG/ACT nasal spray Place into both nostrils daily.  . haloperidol (HALDOL) 5 MG tablet Take 2.5 mg by mouth 3 (three) times a week. (Tuesday, Thursday and Saturday)  . ipratropium (ATROVENT) 0.03 % nasal spray Place 2 sprays into both nostrils 2 (two) times daily as needed for rhinitis.  Marland Kitchen latanoprost (XALATAN) 0.005 % ophthalmic solution Place 1 drop into both eyes at bedtime.   . lidocaine-prilocaine (EMLA) cream Apply 1 application topically every Monday, Wednesday, and Friday with hemodialysis.   Marland Kitchen loratadine (CLARITIN) 10 MG tablet Take 10 mg by mouth daily.  . midodrine (PROAMATINE) 5 MG tablet Take 1 tablet (5 mg total) by mouth 2 (two) times daily with a meal.  . Netarsudil-Latanoprost (ROCKLATAN) 0.02-0.005 % SOLN Apply 1 drop to eye at bedtime.  . ondansetron (ZOFRAN) 4 MG tablet Take 4 mg by mouth every 6 (six) hours as needed for nausea.   . pseudoephedrine (SUDOGEST) 30 MG tablet Take 30 mg by mouth in the morning and at bedtime. x1 week  . rOPINIRole (REQUIP) 2 MG tablet Take 1 tablet (2 mg total) by mouth daily.  . timolol (TIMOPTIC) 0.5 % ophthalmic solution Place 1 drop into both eyes 2 (two) times daily.  . traMADol (ULTRAM) 50 MG tablet Take 1 tablet (50 mg total) by mouth every 6 (six) hours as needed.  . traZODone (DESYREL) 50 MG tablet Take 25 mg by mouth at bedtime.  . vitamin C  (ASCORBIC ACID) 250 MG tablet Take 250 mg by mouth 2 (two) times daily.  . ziprasidone (GEODON) 20 MG capsule Take 20 mg by mouth at bedtime.  . [DISCONTINUED] midodrine (PROAMATINE) 5 MG tablet Take 1 tablet (5 mg total) by mouth 3 (three) times daily with meals.     Allergies:   Morphine and related, Gabapentin, Indomethacin, and Pollen extract   Social History   Socioeconomic History  . Marital status: Divorced    Spouse name: Not on file  . Number of children: 1  . Years of education: Not on file  . Highest education level: Not on file  Occupational History  . 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 Smoking status: Never Smoker  . Smokeless tobacco: Never Used  Substance and Sexual Activity  . Alcohol use: No  Alcohol/week: 0.0 standard drinks  . Drug use: No  . Sexual activity: Not Currently  Other Topics Concern  . 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   Social Determinants of Health   Financial Resource Strain:   . Difficulty of Paying Living Expenses:   Food Insecurity: No Food Insecurity  . Worried About Charity fundraiser in the Last Year: Never true  . Ran Out of Food in the Last Year: Never true  Transportation Needs: No Transportation Needs  . Lack of Transportation (Medical): No  . Lack of Transportation (Non-Medical): No  Physical Activity: Unknown  . Days of Exercise per Week: 7 days  . Minutes of Exercise per Session: Not on file  Stress:   . Feeling of Stress :   Social Connections: Unknown  . Frequency of Communication with Friends and Family: More than three times a week  . Frequency of Social Gatherings with Friends and Family: Not on file  . Attends Religious Services: Not on file  . Active Member of Clubs or Organizations: Not on file  . Attends Archivist Meetings: Not on file  . Marital Status:  Divorced     Family History: The patient's family history includes Breast cancer in an other family member; Hypertension in her father; Stroke in her father. There is no history of Colon cancer, Liver disease, or Mental illness.  ROS:   Please see the history of present illness.     All other systems reviewed and are negative.  EKGs/Labs/Other Studies Reviewed:    The following studies were reviewed today:   EKG:  EKG is  ordered today.  The ekg ordered today demonstrates sinus bradycardia, heart rate 52.  Recent Labs: 05/24/2019: ALT 10; BUN 28; Creatinine, Ser 4.46; Hemoglobin 8.3; Magnesium 1.8; Platelets 202; Potassium 4.3; Sodium 137  Recent Lipid Panel    Component Value Date/Time   CHOL 251 (H) 09/18/2017 0646   CHOL 105 05/14/2014 0412   TRIG 124 09/18/2017 0646   TRIG 127 05/14/2014 0412   HDL 66 09/18/2017 0646   HDL 37 (L) 05/14/2014 0412   CHOLHDL 3.8 09/18/2017 0646   VLDL 25 09/18/2017 0646   VLDL 25 05/14/2014 0412   LDLCALC 160 (H) 09/18/2017 0646   LDLCALC 43 05/14/2014 0412    Physical Exam:    VS:  BP (!) 180/60 (BP Location: Right Arm, Patient Position: Sitting, Cuff Size: Normal)   Pulse (!) 52   Ht 5\' 3"  (1.6 m)   Wt 146 lb 4 oz (66.3 kg)   SpO2 98%   BMI 25.91 kg/m     Wt Readings from Last 3 Encounters:  11/03/19 146 lb 4 oz (66.3 kg)  09/27/19 148 lb (67.1 kg)  05/22/19 147 lb 6.4 oz (66.9 kg)     GEN:  Well nourished, well developed in no acute distress HEENT: Normal NECK: No JVD; No carotid bruits LYMPHATICS: No lymphadenopathy CARDIAC: RRR, systolic murmur noted on exam, consistent with AV fistula present RESPIRATORY:  Clear to auscultation without rales, wheezing or rhonchi  ABDOMEN: Soft, non-tender, non-distended MUSCULOSKELETAL:  No edema; left arm AV fistula noted SKIN: Warm and dry NEUROLOGIC:  Alert and oriented x 3 PSYCHIATRIC:  Normal affect   ASSESSMENT:    1. Orthostatic hypotension   2. Elevated BP without  diagnosis of hypertension   3. Hx of sinus bradycardia  PLAN:      1. Patient with history of passing out after dialysis sessions.  Prior documented orthostatic hypotension.  Symptoms improved with midodrine 3 times daily, but BP elevated.  Decrease midodrine to 5 mg twice daily. 2. Elevated blood pressure likely due to midodrine.  Decrease midodrine dose as above. 3. Patient has a history of sinus bradycardia.  Heart rate 52.  She is asymptomatic.  No indication for further testing or pacemaker.  Follow-up in 3 months  This note was generated in part or whole with voice recognition software. Voice recognition is usually quite accurate but there are transcription errors that can and very often do occur. I apologize for any typographical errors that were not detected and corrected.  Medication Adjustments/Labs and Tests Ordered: Current medicines are reviewed at length with the patient today.  Concerns regarding medicines are outlined above.  Orders Placed This Encounter  Procedures  . EKG 12-Lead   Meds ordered this encounter  Medications  . midodrine (PROAMATINE) 5 MG tablet    Sig: Take 1 tablet (5 mg total) by mouth 2 (two) times daily with a meal.    Dispense:  180 tablet    Refill:  3    Patient Instructions  Medication Instructions:  Your physician has recommended you make the following change in your medication:  1. DECREASE Midodrine to 5 mg twice a day  *If you need a refill on your cardiac medications before your next appointment, please call your pharmacy*   Lab Work: None  If you have labs (blood work) drawn today and your tests are completely normal, you will receive your results only by: Marland Kitchen MyChart Message (if you have MyChart) OR . A paper copy in the mail If you have any lab test that is abnormal or we need to change your treatment, we will call you to review the results.   Testing/Procedures: None   Follow-Up: At [facility], you and your health  needs are our priority.  As part of our continuing mission to provide you with exceptional heart care, we have created designated Provider Care Teams.  These Care Teams include your primary Cardiologist (physician) and Advanced Practice Providers (APPs -  Physician Assistants and Nurse Practitioners) who all work together to provide you with the care you need, when you need it.  Your next appointment:   3 month(s)  The format for your next appointment:   In Person  Provider:    You may see Kate Sable, MD or one of the following Advanced Practice Providers on your designated Care Team:    Murray Hodgkins, NP  Christell Faith, PA-C  Marrianne Mood, PA-C      Signed, Kate Sable, MD  11/03/2019 10:59 AM    [place]

## 2019-11-03 NOTE — Patient Instructions (Addendum)
Medication Instructions:  Your physician has recommended you make the following change in your medication:  1. DECREASE Midodrine to 5 mg twice a day  *If you need a refill on your cardiac medications before your next appointment, please call your pharmacy*   Lab Work: None  If you have labs (blood work) drawn today and your tests are completely normal, you will receive your results only by: Marland Kitchen MyChart Message (if you have MyChart) OR . A paper copy in the mail If you have any lab test that is abnormal or we need to change your treatment, we will call you to review the results.   Testing/Procedures: None   Follow-Up: At [facility], you and your health needs are our priority.  As part of our continuing mission to provide you with exceptional heart care, we have created designated Provider Care Teams.  These Care Teams include your primary Cardiologist (physician) and Advanced Practice Providers (APPs -  Physician Assistants and Nurse Practitioners) who all work together to provide you with the care you need, when you need it.  Your next appointment:   3 month(s)  The format for your next appointment:   In Person  Provider:    You may see Kate Sable, MD or one of the following Advanced Practice Providers on your designated Care Team:    Murray Hodgkins, NP  Christell Faith, PA-C  Marrianne Mood, PA-C

## 2019-11-21 ENCOUNTER — Other Ambulatory Visit: Payer: Self-pay

## 2019-11-21 ENCOUNTER — Encounter: Payer: Self-pay | Admitting: Cardiology

## 2019-11-21 ENCOUNTER — Ambulatory Visit (INDEPENDENT_AMBULATORY_CARE_PROVIDER_SITE_OTHER): Payer: Medicare HMO | Admitting: Cardiology

## 2019-11-21 VITALS — BP 130/60 | HR 59 | Ht 63.0 in | Wt 145.5 lb

## 2019-11-21 DIAGNOSIS — I10 Essential (primary) hypertension: Secondary | ICD-10-CM | POA: Diagnosis not present

## 2019-11-21 DIAGNOSIS — K2100 Gastro-esophageal reflux disease with esophagitis, without bleeding: Secondary | ICD-10-CM

## 2019-11-21 DIAGNOSIS — I951 Orthostatic hypotension: Secondary | ICD-10-CM | POA: Diagnosis not present

## 2019-11-21 NOTE — Patient Instructions (Signed)
Medication Instructions:  Your physician recommends that you continue on your current medications as directed. Please refer to the Current Medication list given to you today.  *If you need a refill on your cardiac medications before your next appointment, please call your pharmacy*   Lab Work: None ordered If you have labs (blood work) drawn today and your tests are completely normal, you will receive your results only by: Marland Kitchen MyChart Message (if you have MyChart) OR . A paper copy in the mail If you have any lab test that is abnormal or we need to change your treatment, we will call you to review the results.   Testing/Procedures: None ordered   Follow-Up: At [facility], you and your health needs are our priority.  As part of our continuing mission to provide you with exceptional heart care, we have created designated Provider Care Teams.  These Care Teams include your primary Cardiologist (physician) and Advanced Practice Providers (APPs -  Physician Assistants and Nurse Practitioners) who all work together to provide you with the care you need, when you need it.  We recommend signing up for the patient portal called "MyChart".  Sign up information is provided on this After Visit Summary.  MyChart is used to connect with patients for Virtual Visits (Telemedicine).  Patients are able to view lab/test results, encounter notes, upcoming appointments, etc.  Non-urgent messages can be sent to your provider as well.   To learn more about what you can do with MyChart, go to [email or web address].    Your next appointment:   As needed   The format for your next appointment:   In Person  Provider:    You may see Kate Sable, MD or one of the following Advanced Practice Providers on your designated Care Team:    Murray Hodgkins, NP  Christell Faith, PA-C  Marrianne Mood, PA-C    Other Instructions N/A

## 2019-11-21 NOTE — Progress Notes (Signed)
Cardiology Office Note:    Date:  11/21/2019   ID:  Joyce, [name] Dec 07, 1945, MRN [id number]  PCP:  Orvis Brill, [facility]  Cardiologist:  Kate Sable, MD  Electrophysiologist:  None   Referring MD: Orvis Brill, [facility]*   Chief Complaint  Patient presents with  . office visit    History of Present Illness:    Joyce [name] is a 74 y.o. female with a hx of GERD, hypertension, end-stage renal disease on dialysis MWF, who presents for follow-up.  She was previously seen due to bradycardia, dizziness, syncope.  She also had orthostasis.  Midodrine was started and titrated to help with symptoms while monitoring for hypertension.  Her blood pressures have stayed in the normal range, she is able to undergo dialysis sessions without any issues.  She also denies passing out after dialysis sessions on current dose of midodrine.  Patient states having some nausea since last visit, symptoms of reflux controlled with Pepcid.    Past Medical History:  Diagnosis Date  . Anal fissure   . Bipolar affective disorder ([place])   . CKD (chronic kidney disease)    Dr Holley Raring [name] 4  . Colon polyps   . Diverticulitis   . Diverticulitis   . Family history of adverse reaction to anesthesia    mom - PONV  . GERD (gastroesophageal reflux disease)   . Headache    migraines - none over 10 yrs  . Heart murmur   . History of hiatal hernia   . Hypertension   . Pancreatitis    Valproic acid  . Vertigo     Past Surgical History:  Procedure Laterality Date  . A/V FISTULAGRAM Left 04/14/2017   Procedure: A/V Fistulagram;  Surgeon: Katha Cabal, MD;  Location: [facility];  Service: Cardiovascular;  Laterality: Left;  . A/V FISTULAGRAM Left 06/09/2017   Procedure: A/V FISTULAGRAM;  Surgeon: Katha Cabal, MD;  Location: [facility];  Service: Cardiovascular;  Laterality: Left;  . A/V FISTULAGRAM Left 11/02/2017   Procedure: A/V FISTULAGRAM;  Surgeon:  Algernon Huxley, MD;  Location: [facility];  Service: Cardiovascular;  Laterality: Left;  . A/V FISTULAGRAM Left 12/22/2018   Procedure: A/V FISTULAGRAM;  Surgeon: Katha Cabal, MD;  Location: [facility];  Service: Cardiovascular;  Laterality: Left;  . A/V SHUNT INTERVENTION N/A 11/02/2017   Procedure: A/V SHUNT INTERVENTION;  Surgeon: Algernon Huxley, MD;  Location: [facility] [facility];  Service: Cardiovascular;  Laterality: N/A;  . ABDOMINAL HYSTERECTOMY  1990 ?  . AV FISTULA PLACEMENT  4/30  . BREAST EXCISIONAL BIOPSY Left 1994   neg surgical bx  . CHOLECYSTECTOMY  2003  . COLONOSCOPY  2014   Dr. Jamal Collin  . COLONOSCOPY WITH PROPOFOL N/A 09/24/2015   Procedure: COLONOSCOPY WITH random colon byopies.;  Surgeon: Lucilla Lame, MD;  Location: [facility];  Service: Endoscopy;  Laterality: N/A;  . ESOPHAGOGASTRODUODENOSCOPY (EGD) WITH PROPOFOL N/A 09/24/2015   Procedure: ESOPHAGOGASTRODUODENOSCOPY (EGD) ;  Surgeon: Lucilla Lame, MD;  Location: [facility];  Service: Endoscopy;  Laterality: N/A;  . EYE SURGERY    . PERIPHERAL VASCULAR CATHETERIZATION N/A 05/29/2015   Procedure: A/V Shuntogram/Fistulagram;  Surgeon: Katha Cabal, MD;  Location: [facility];  Service: Cardiovascular;  Laterality: N/A;  . PERIPHERAL VASCULAR CATHETERIZATION N/A 05/29/2015   Procedure: A/V Shunt Intervention;  Surgeon: Katha Cabal, MD;  Location: [facility] [facility];  Service: Cardiovascular;  Laterality: N/A;  . POLYPECTOMY  09/24/2015   Procedure: POLYPECTOMY INTESTINAL;  Surgeon: Lucilla Lame, MD;  Location: [place];  Service: Endoscopy;;  cecal polyp ascending polyp    Current Medications: Current Meds  Medication Sig  . acetaminophen (TYLENOL) 325 MG tablet Take 500 mg by mouth every 6 (six) hours as needed for mild pain or fever.   Marland Kitchen b complex-vitamin c-folic acid (NEPHRO-VITE) 0.8 MG TABS tablet Take 1 tablet by mouth daily.  . brimonidine  (ALPHAGAN) 0.2 % ophthalmic solution Place 1 drop into both eyes 2 (two) times daily.  . calcium acetate (PHOSLO) 667 MG capsule Take 667 mg by mouth 3 (three) times daily with meals.   . carbamazepine (TEGRETOL) 100 MG chewable tablet Chew 100 mg by mouth 2 (two) times daily.  . cyanocobalamin 1000 MCG tablet Take 1,000 mcg by mouth daily.  Marland Kitchen dicyclomine (BENTYL) 20 MG tablet Take 20 mg by mouth 3 (three) times daily after meals.  . famotidine (PEPCID) 20 MG tablet Take 1 tablet (20 mg total) by mouth daily.  . fluticasone (FLONASE) 50 MCG/ACT nasal spray Place into both nostrils daily.  . haloperidol (HALDOL) 5 MG tablet Take 2.5 mg by mouth 3 (three) times a week. (Tuesday, Thursday and Saturday)  . ipratropium (ATROVENT) 0.03 % nasal spray Place 2 sprays into both nostrils 2 (two) times daily as needed for rhinitis.  Marland Kitchen latanoprost (XALATAN) 0.005 % ophthalmic solution Place 1 drop into both eyes at bedtime.   . lidocaine-prilocaine (EMLA) cream Apply 1 application topically every Monday, Wednesday, and Friday with hemodialysis.   Marland Kitchen loratadine (CLARITIN) 10 MG tablet Take 10 mg by mouth daily.  . midodrine (PROAMATINE) 5 MG tablet Take 1 tablet (5 mg total) by mouth 2 (two) times daily with a meal.  . Netarsudil-Latanoprost (ROCKLATAN) 0.02-0.005 % SOLN Apply 1 drop to eye at bedtime.  . ondansetron (ZOFRAN) 4 MG tablet Take 4 mg by mouth every 6 (six) hours as needed for nausea.   . pseudoephedrine (SUDOGEST) 30 MG tablet Take 30 mg by mouth in the morning and at bedtime. x1 week  . rOPINIRole (REQUIP) 2 MG tablet Take 1 tablet (2 mg total) by mouth daily.  . timolol (TIMOPTIC) 0.5 % ophthalmic solution Place 1 drop into both eyes 2 (two) times daily.  . traMADol (ULTRAM) 50 MG tablet Take 1 tablet (50 mg total) by mouth every 6 (six) hours as needed.  . traZODone (DESYREL) 50 MG tablet Take 25 mg by mouth at bedtime.  . vitamin C (ASCORBIC ACID) 250 MG tablet Take 250 mg by mouth 2 (two)  times daily.  . ziprasidone (GEODON) 20 MG capsule Take 20 mg by mouth at bedtime.     Allergies:   Morphine and related, Gabapentin, Indomethacin, and Pollen extract   Social History   Socioeconomic History  . Marital status: Divorced    Spouse name: Not on file  . Number of children: 1  . Years of education: Not on file  . Highest education level: Not on file  Occupational History  . 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 Smoking status: Never Smoker  . Smokeless tobacco: Never Used  Substance and Sexual Activity  . Alcohol use: No    Alcohol/week: 0.0 standard drinks  . Drug use: No  . Sexual activity: Not Currently  Other Topics Concern  . 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   Social Determinants of Health   Financial Resource Strain:   . Difficulty of Paying Living Expenses:   Food Insecurity: No Food Insecurity  . Worried About Charity fundraiser in the Last Year: Never true  . Ran Out of Food in the Last Year: Never true  Transportation Needs: No Transportation Needs  . Lack of Transportation (Medical): No  . Lack of Transportation (Non-Medical): No  Physical Activity: Unknown  . Days of Exercise per Week: 7 days  . Minutes of Exercise per Session: Not on file  Stress:   . Feeling of Stress :   Social Connections: Unknown  . Frequency of Communication with Friends and Family: More than three times a week  . Frequency of Social Gatherings with Friends and Family: Not on file  . Attends Religious Services: Not on file  . Active Member of Clubs or Organizations: Not on file  . Attends Archivist Meetings: Not on file  . Marital Status: Divorced     Family History: The patient's family history includes Breast cancer in an other family member; Hypertension in her father; Stroke in her father. There is no history of  Colon cancer, Liver disease, or Mental illness.  ROS:   Please see the history of present illness.     All other systems reviewed and are negative.  EKGs/Labs/Other Studies Reviewed:    The following studies were reviewed today:   EKG:  EKG is  ordered today.  The ekg ordered today demonstrates sinus bradycardia, heart rate 59.  Recent Labs: 05/24/2019: ALT 10; BUN 28; Creatinine, Ser 4.46; Hemoglobin 8.3; Magnesium 1.8; Platelets 202; Potassium 4.3; Sodium 137  Recent Lipid Panel    Component Value Date/Time   CHOL 251 (H) 09/18/2017 0646   CHOL 105 05/14/2014 0412   TRIG 124 09/18/2017 0646   TRIG 127 05/14/2014 0412   HDL 66 09/18/2017 0646   HDL 37 (L) 05/14/2014 0412   CHOLHDL 3.8 09/18/2017 0646   VLDL 25 09/18/2017 0646   VLDL 25 05/14/2014 0412   LDLCALC 160 (H) 09/18/2017 0646   LDLCALC 43 05/14/2014 0412    Physical Exam:    VS:  BP 130/60 (BP Location: Right Arm, Patient Position: Sitting, Cuff Size: Normal)   Pulse (!) 59   Ht 5\' 3"  (1.6 m)   Wt 145 lb 8 oz (66 kg)   SpO2 95%   BMI 25.77 kg/m     Wt Readings from Last 3 Encounters:  11/21/19 145 lb 8 oz (66 kg)  11/03/19 146 lb 4 oz (66.3 kg)  09/27/19 148 lb (67.1 kg)     GEN:  Well nourished, well developed in no acute distress HEENT: Normal NECK: No JVD; No carotid bruits LYMPHATICS: No lymphadenopathy CARDIAC: RRR, systolic murmur noted on exam, consistent with AV fistula present RESPIRATORY:  Clear to auscultation without rales, wheezing or rhonchi  ABDOMEN: Soft, non-tender, non-distended MUSCULOSKELETAL:  No edema; left arm AV fistula noted SKIN: Warm and dry NEUROLOGIC:  Alert and oriented x 3 PSYCHIATRIC:  Normal affect   ASSESSMENT:    1. Orthostatic hypotension   2. Essential hypertension   3. Gastroesophageal reflux disease with esophagitis without hemorrhage    PLAN:      1. Patient with history of orthostatic hypotension and passing out after dialysis sessions.  Started on  midodrine with good effect.  Currently without symptoms.  Continue midodrine  5 mg 3 times daily. 2. Prior history of hypertension, blood pressures are currently normal to low normal requiring midodrine as above.  No indications for BP meds. 3. History of GERD, symptoms of reflux managed with Pepcid.  Now has nausea, not sure if this has any relation with reflux.  Recommend following up with primary care provider for further management  Follow-up as needed  This note was generated in part or whole with voice recognition software. Voice recognition is usually quite accurate but there are transcription errors that can and very often do occur. I apologize for any typographical errors that were not detected and corrected.  Medication Adjustments/Labs and Tests Ordered: Current medicines are reviewed at length with the patient today.  Concerns regarding medicines are outlined above.  Orders Placed This Encounter  Procedures  . EKG 12-Lead   No orders of the defined types were placed in this encounter.   Patient Instructions  Medication Instructions:  Your physician recommends that you continue on your current medications as directed. Please refer to the Current Medication list given to you today.  *If you need a refill on your cardiac medications before your next appointment, please call your pharmacy*   Lab Work: None ordered If you have labs (blood work) drawn today and your tests are completely normal, you will receive your results only by: Marland Kitchen MyChart Message (if you have MyChart) OR . A paper copy in the mail If you have any lab test that is abnormal or we need to change your treatment, we will call you to review the results.   Testing/Procedures: None ordered   Follow-Up: At [facility], you and your health needs are our priority.  As part of our continuing mission to provide you with exceptional heart care, we have created designated Provider Care Teams.  These Care Teams  include your primary Cardiologist (physician) and Advanced Practice Providers (APPs -  Physician Assistants and Nurse Practitioners) who all work together to provide you with the care you need, when you need it.  We recommend signing up for the patient portal called "MyChart".  Sign up information is provided on this After Visit Summary.  MyChart is used to connect with patients for Virtual Visits (Telemedicine).  Patients are able to view lab/test results, encounter notes, upcoming appointments, etc.  Non-urgent messages can be sent to your provider as well.   To learn more about what you can do with MyChart, go to [email or web address].    Your next appointment:   As needed   The format for your next appointment:   In Person  Provider:    You may see Kate Sable, MD or one of the following Advanced Practice Providers on your designated Care Team:    Murray Hodgkins, NP  Christell Faith, PA-C  Marrianne Mood, PA-C    Other Instructions N/A     Signed, Kate Sable, MD  11/21/2019 12:06 PM    [place]

## 2020-02-10 ENCOUNTER — Other Ambulatory Visit (INDEPENDENT_AMBULATORY_CARE_PROVIDER_SITE_OTHER): Payer: Self-pay | Admitting: Vascular Surgery

## 2020-02-10 DIAGNOSIS — N186 End stage renal disease: Secondary | ICD-10-CM

## 2020-02-13 ENCOUNTER — Ambulatory Visit (INDEPENDENT_AMBULATORY_CARE_PROVIDER_SITE_OTHER): Payer: Medicare HMO | Admitting: Vascular Surgery

## 2020-02-13 ENCOUNTER — Ambulatory Visit (INDEPENDENT_AMBULATORY_CARE_PROVIDER_SITE_OTHER): Payer: Medicare HMO

## 2020-02-13 ENCOUNTER — Encounter (INDEPENDENT_AMBULATORY_CARE_PROVIDER_SITE_OTHER): Payer: Self-pay | Admitting: Vascular Surgery

## 2020-02-13 ENCOUNTER — Other Ambulatory Visit: Payer: Self-pay

## 2020-02-13 VITALS — BP 184/68 | HR 66 | Resp 16 | Wt 146.4 lb

## 2020-02-13 DIAGNOSIS — N186 End stage renal disease: Secondary | ICD-10-CM

## 2020-02-13 DIAGNOSIS — I10 Essential (primary) hypertension: Secondary | ICD-10-CM

## 2020-02-13 DIAGNOSIS — T829XXS Unspecified complication of cardiac and vascular prosthetic device, implant and graft, sequela: Secondary | ICD-10-CM

## 2020-02-13 DIAGNOSIS — K219 Gastro-esophageal reflux disease without esophagitis: Secondary | ICD-10-CM

## 2020-02-13 DIAGNOSIS — Z992 Dependence on renal dialysis: Secondary | ICD-10-CM | POA: Diagnosis not present

## 2020-02-13 NOTE — Progress Notes (Signed)
MRN : [id number]  Joyce [name] is a 74 y.o. (1945-09-01) female who presents with chief complaint of  Chief Complaint  Patient presents with  . Follow-up    u/s follow up  .  History of Present Illness:   The patient returns to the office for followup status post intervention of the dialysis access left brachiocephalic AV fistula. Following the intervention the access function has significantly improved, with better flow rates and improved KT/V. The patient has not been experiencing increased bleeding times following decannulation and the patient denies increased recirculation. The patient denies an increase in arm swelling. At the present time the patient denies hand pain.  The patient denies amaurosis fugax or recent TIA symptoms. There are no recent neurological changes noted. The patient denies claudication symptoms or rest pain symptoms. The patient denies history of DVT, PE or superficial thrombophlebitis. The patient denies recent episodes of angina or shortness of breath.   Noninvasive studies show a flow volume of 3572 ml/min with patent brachiocephalic AV fistula including the stents.  There are some increased velocities at the region of the AV fistula where the original stents were seen previously.  Current Meds  Medication Sig  . acetaminophen (TYLENOL) 325 MG tablet Take 500 mg by mouth every 6 (six) hours as needed for mild pain or fever.   Marland Kitchen b complex-vitamin c-folic acid (NEPHRO-VITE) 0.8 MG TABS tablet Take 1 tablet by mouth daily.  . brimonidine (ALPHAGAN) 0.2 % ophthalmic solution Place 1 drop into both eyes 2 (two) times daily.  . calcium acetate (PHOSLO) 667 MG capsule Take 667 mg by mouth 3 (three) times daily with meals.   . carbamazepine (TEGRETOL) 100 MG chewable tablet Chew 100 mg by mouth 2 (two) times daily.  . cyanocobalamin 1000 MCG tablet Take 1,000 mcg by mouth daily.  Marland Kitchen dicyclomine (BENTYL) 20 MG tablet Take 20 mg by mouth 3 (three) times daily  after meals.  . famotidine (PEPCID) 20 MG tablet Take 1 tablet (20 mg total) by mouth daily.  . fluticasone (FLONASE) 50 MCG/ACT nasal spray Place into both nostrils daily.  . haloperidol (HALDOL) 5 MG tablet Take 2.5 mg by mouth 3 (three) times a week. (Tuesday, Thursday and Saturday)  . ipratropium (ATROVENT) 0.03 % nasal spray Place 2 sprays into both nostrils 2 (two) times daily as needed for rhinitis.  Marland Kitchen latanoprost (XALATAN) 0.005 % ophthalmic solution Place 1 drop into both eyes at bedtime.   . lidocaine-prilocaine (EMLA) cream Apply 1 application topically every Monday, Wednesday, and Friday with hemodialysis.   Marland Kitchen loratadine (CLARITIN) 10 MG tablet Take 10 mg by mouth daily.  . metoprolol tartrate (LOPRESSOR) 25 MG tablet   . midodrine (PROAMATINE) 10 MG tablet   . Netarsudil-Latanoprost (ROCKLATAN) 0.02-0.005 % SOLN Apply 1 drop to eye at bedtime.  Marland Kitchen omeprazole (PRILOSEC) 40 MG capsule   . ondansetron (ZOFRAN) 4 MG tablet Take 4 mg by mouth every 6 (six) hours as needed for nausea.   . pseudoephedrine (SUDOGEST) 30 MG tablet Take 30 mg by mouth in the morning and at bedtime. x1 week  . rOPINIRole (REQUIP) 2 MG tablet Take 1 tablet (2 mg total) by mouth daily.  . simethicone (MYLICON) 154 MG chewable tablet Chew 125 mg by mouth 3 (three) times daily.  . timolol (TIMOPTIC) 0.5 % ophthalmic solution Place 1 drop into both eyes 2 (two) times daily.  . traZODone (DESYREL) 50 MG tablet Take 25 mg by mouth at bedtime.  Marland Kitchen  vitamin C (ASCORBIC ACID) 250 MG tablet Take 250 mg by mouth 2 (two) times daily.  . ziprasidone (GEODON) 20 MG capsule Take 20 mg by mouth at bedtime.    Past Medical History:  Diagnosis Date  . Anal fissure   . Bipolar affective disorder ([place])   . CKD (chronic kidney disease)    Dr Holley Raring [name] 4  . Colon polyps   . Diverticulitis   . Diverticulitis   . Family history of adverse reaction to anesthesia    mom - PONV  . GERD (gastroesophageal reflux disease)   .  Headache    migraines - none over 10 yrs  . Heart murmur   . History of hiatal hernia   . Hypertension   . Pancreatitis    Valproic acid  . Vertigo     Past Surgical History:  Procedure Laterality Date  . A/V FISTULAGRAM Left 04/14/2017   Procedure: A/V Fistulagram;  Surgeon: Katha Cabal, MD;  Location: [facility] [facility];  Service: Cardiovascular;  Laterality: Left;  . A/V FISTULAGRAM Left 06/09/2017   Procedure: A/V FISTULAGRAM;  Surgeon: Katha Cabal, MD;  Location: [facility];  Service: Cardiovascular;  Laterality: Left;  . A/V FISTULAGRAM Left 11/02/2017   Procedure: A/V FISTULAGRAM;  Surgeon: Algernon Huxley, MD;  Location: [place] [facility];  Service: Cardiovascular;  Laterality: Left;  . A/V FISTULAGRAM Left 12/22/2018   Procedure: A/V FISTULAGRAM;  Surgeon: Katha Cabal, MD;  Location: [place] [facility];  Service: Cardiovascular;  Laterality: Left;  . A/V SHUNT INTERVENTION N/A 11/02/2017   Procedure: A/V SHUNT INTERVENTION;  Surgeon: Algernon Huxley, MD;  Location: [place] [facility];  Service: Cardiovascular;  Laterality: N/A;  . ABDOMINAL HYSTERECTOMY  1990 ?  . AV FISTULA PLACEMENT  4/30  . BREAST EXCISIONAL BIOPSY Left 1994   neg surgical bx  . CHOLECYSTECTOMY  2003  . COLONOSCOPY  2014   Dr. Jamal Collin  . COLONOSCOPY WITH PROPOFOL N/A 09/24/2015   Procedure: COLONOSCOPY WITH random colon byopies.;  Surgeon: Lucilla Lame, MD;  Location: [place];  Service: Endoscopy;  Laterality: N/A;  . ESOPHAGOGASTRODUODENOSCOPY (EGD) WITH PROPOFOL N/A 09/24/2015   Procedure: ESOPHAGOGASTRODUODENOSCOPY (EGD) ;  Surgeon: Lucilla Lame, MD;  Location: [place];  Service: Endoscopy;  Laterality: N/A;  . EYE SURGERY    . PERIPHERAL VASCULAR CATHETERIZATION N/A 05/29/2015   Procedure: A/V Shuntogram/Fistulagram;  Surgeon: Katha Cabal, MD;  Location: [place] [facility];  Service: Cardiovascular;  Laterality: N/A;  . PERIPHERAL VASCULAR  CATHETERIZATION N/A 05/29/2015   Procedure: A/V Shunt Intervention;  Surgeon: Katha Cabal, MD;  Location: [place] [facility];  Service: Cardiovascular;  Laterality: N/A;  . POLYPECTOMY  09/24/2015   Procedure: POLYPECTOMY INTESTINAL;  Surgeon: Lucilla Lame, MD;  Location: [place];  Service: Endoscopy;;  cecal polyp ascending polyp    Social History Social History   Tobacco Use  . Smoking status: Never Smoker  . Smokeless tobacco: Never Used  Vaping Use  . Vaping Use: Never used  Substance Use Topics  . Alcohol use: No    Alcohol/week: 0.0 standard drinks  . Drug use: No    Family History Family History  Problem Relation Age of Onset  . Stroke Father   . Hypertension Father   . Breast cancer Other   . Colon cancer Neg Hx   . Liver disease Neg Hx   . Mental illness Neg Hx  Allergies  Allergen Reactions  . Morphine And Related Shortness Of Breath    Pt reports chest pain and difficulty breathing.  . Gabapentin   . Indomethacin Hives  . Pollen Extract Other (See Comments)    Sinus problems and HA     REVIEW OF SYSTEMS (Negative unless checked)  Constitutional: [] Weight loss  [] Fever  [] Chills Cardiac: [] Chest pain   [] Chest pressure   [] Palpitations   [] Shortness of breath when laying flat   [] Shortness of breath with exertion. Vascular:  [] Pain in legs with walking   [] Pain in legs at rest  [] History of DVT   [] Phlebitis   [] Swelling in legs   [] Varicose veins   [] Non-healing ulcers Pulmonary:   [] Uses home oxygen   [] Productive cough   [] Hemoptysis   [] Wheeze  [] COPD   [] Asthma Neurologic:  [] Dizziness   [] Seizures   [] History of stroke   [] History of TIA  [] Aphasia   [] Vissual changes   [] Weakness or numbness in arm   [] Weakness or numbness in leg Musculoskeletal:   [] Joint swelling   [] Joint pain   [] Low back pain Hematologic:  [] Easy bruising  [] Easy bleeding   [] Hypercoagulable state   [] Anemic Gastrointestinal:  [] Diarrhea   [] Vomiting   [] Gastroesophageal reflux/heartburn   [] Difficulty swallowing. Genitourinary:  [x] Chronic kidney disease   [] Difficult urination  [] Frequent urination   [] Blood in urine Skin:  [] Rashes   [] Ulcers  Psychological:  [] History of anxiety   []  History of major depression.  Physical Examination  Vitals:   02/13/20 1434  BP: (!) 184/68  Pulse: 66  Resp: 16  Weight: 146 lb 6.4 oz (66.4 kg)   Body mass index is 25.93 kg/m. Gen: WD/WN, NAD Head: [place]/AT, No temporalis wasting.  Ear/Nose/Throat: Hearing grossly intact, nares w/o erythema or drainage Eyes: PER, EOMI, sclera nonicteric.  Neck: Supple, no large masses.   Pulmonary:  Good air movement, no audible wheezing bilaterally, no use of accessory muscles.  Cardiac: RRR, no JVD Vascular:  AV fistula left arm good thrill good bruit moderate aneurysmal degeneration Vessel Right Left  Radial Palpable Palpable  Brachial Palpable Palpable  Gastrointestinal: Non-distended. No guarding/no peritoneal signs.  Musculoskeletal: M/S 5/5 throughout.  No deformity or atrophy.  Neurologic: CN 2-12 intact. Symmetrical.  Speech is fluent. Motor exam as listed above. Psychiatric: Judgment intact, Mood & affect appropriate for pt's clinical situation. Dermatologic: No rashes or ulcers noted.  No changes consistent with cellulitis.   CBC Lab Results  Component Value Date   WBC 6.4 05/24/2019   HGB 8.3 (L) 05/24/2019   HCT 24.5 (L) 05/24/2019   MCV 98.4 05/24/2019   PLT 202 05/24/2019    BMET    Component Value Date/Time   NA 137 05/24/2019 0513   NA 139 11/09/2014 1027   K 4.3 05/24/2019 0513   K 4.7 11/09/2014 1027   CL 100 05/24/2019 0513   CL 108 11/09/2014 1027   CO2 27 05/24/2019 0513   CO2 25 11/09/2014 1027   GLUCOSE 96 05/24/2019 0513   GLUCOSE 100 (H) 11/09/2014 1027   BUN 28 (H) 05/24/2019 0513   BUN 51 (H) 11/09/2014 1027   CREATININE 4.46 (H) 05/24/2019 0513   CREATININE 2.98 (H) 11/09/2014 1027   CALCIUM 8.3 (L)  05/24/2019 0513   CALCIUM 8.9 11/09/2014 1027   GFRNONAA 9 (L) 05/24/2019 0513   GFRNONAA 15 (L) 11/09/2014 1027   GFRAA 11 (L) 05/24/2019 0513   GFRAA 18 (L) 11/09/2014 1027   CrCl  cannot be calculated (Patient's most recent lab result is older than the maximum 21 days allowed.).  COAG Lab Results  Component Value Date   INR 1.08 10/03/2016   INR 1.0 11/09/2014    Radiology No results found.   Assessment/Plan 1. Complication of vascular access for dialysis, sequela Recommend:  The patient is doing well and currently has adequate dialysis access. The patient's dialysis center is not reporting any major access issues.  However, the patient's thrill is somewhat pulsatile at this time.  This raises concerns that the access is at moderate but not high risk for a problem or thrombosis and should be followed more closely  The patient will follow-up with me in the office in 6 months without an HDA  - VAS [place] (AVF, AVG); Future  2. ESRD on dialysis [facility]) At the present time the patient has adequate dialysis access.  Continue hemodialysis as ordered without interruption.  Avoid nephrotoxic medications and dehydration.  Further plans per nephrology  3. Essential hypertension Continue antihypertensive medications as already ordered, these medications have been reviewed and there are no changes at this time.   4. Gastro-esophageal reflux disease without esophagitis Continue PPI as already ordered, this medication has been reviewed and there are no changes at this time.  Avoidence of caffeine and alcohol  Moderate elevation of the head of the bed     [name], MD  02/13/2020 3:02 PM

## 2020-02-14 ENCOUNTER — Ambulatory Visit (INDEPENDENT_AMBULATORY_CARE_PROVIDER_SITE_OTHER): Payer: Medicare HMO | Admitting: Vascular Surgery

## 2020-02-14 ENCOUNTER — Encounter (INDEPENDENT_AMBULATORY_CARE_PROVIDER_SITE_OTHER): Payer: Medicare HMO

## 2020-02-16 ENCOUNTER — Ambulatory Visit: Payer: Medicare HMO | Admitting: Cardiology

## 2020-02-17 ENCOUNTER — Encounter: Payer: Self-pay | Admitting: Cardiology

## 2020-02-28 ENCOUNTER — Ambulatory Visit (INDEPENDENT_AMBULATORY_CARE_PROVIDER_SITE_OTHER): Payer: Medicare HMO | Admitting: Gastroenterology

## 2020-02-28 ENCOUNTER — Other Ambulatory Visit: Payer: Self-pay

## 2020-02-28 ENCOUNTER — Encounter: Payer: Self-pay | Admitting: Gastroenterology

## 2020-02-28 VITALS — BP 156/67 | HR 65 | Ht 63.0 in | Wt 145.8 lb

## 2020-02-28 DIAGNOSIS — R110 Nausea: Secondary | ICD-10-CM | POA: Diagnosis not present

## 2020-02-28 NOTE — Progress Notes (Signed)
Primary Care Physician: [facility]  Primary Gastroenterologist:  Dr. Lucilla Lame  Chief Complaint  Patient presents with  . Follow-up    HPI: Joyce [name] is a 74 y.o. very pleasant female here with a history of nausea and diarrhea.  The patient states that she lives at assisted living and reports that her diarrhea is under control at the present time but she still continues to have nausea.  The patient had seen me back in 2018 for the diarrhea and underwent an EGD and colonoscopy at that time.  The patient reports that her nausea is mostly in the morning.  There is no report of any associated weight loss with the nausea.  She also reports that her primary care physician is taking very good care of her.  There is no report of any abdominal pain fevers chills black stools or bloody stools.  The patient is presently taking a proton pump inhibitor and states that she takes it in the morning.  Past Medical History:  Diagnosis Date  . Anal fissure   . Bipolar affective disorder ([place])   . CKD (chronic kidney disease)    Dr Holley Raring [name] 4  . Colon polyps   . Diverticulitis   . Diverticulitis   . Family history of adverse reaction to anesthesia    mom - PONV  . GERD (gastroesophageal reflux disease)   . Headache    migraines - none over 10 yrs  . Heart murmur   . History of hiatal hernia   . Hypertension   . Pancreatitis    Valproic acid  . Vertigo     Current Outpatient Medications  Medication Sig Dispense Refill  . acetaminophen (TYLENOL) 325 MG tablet Take 500 mg by mouth every 6 (six) hours as needed for mild pain or fever.     Marland Kitchen b complex-vitamin c-folic acid (NEPHRO-VITE) 0.8 MG TABS tablet Take 1 tablet by mouth daily.    . brimonidine (ALPHAGAN) 0.15 % ophthalmic solution     . brimonidine (ALPHAGAN) 0.2 % ophthalmic solution Place 1 drop into both eyes 2 (two) times daily. 5 mL 1  . calcium acetate (PHOSLO) 667 MG capsule Take 667 mg by mouth 3  (three) times daily with meals.     . carbamazepine (TEGRETOL) 100 MG chewable tablet Chew 100 mg by mouth 2 (two) times daily.    . cyanocobalamin 1000 MCG tablet Take 1,000 mcg by mouth daily.    Marland Kitchen dicyclomine (BENTYL) 20 MG tablet Take 20 mg by mouth 3 (three) times daily after meals.    . famotidine (PEPCID) 20 MG tablet Take 1 tablet (20 mg total) by mouth daily. 30 tablet 1  . fluticasone (FLONASE) 50 MCG/ACT nasal spray Place into both nostrils daily.    . haloperidol (HALDOL) 5 MG tablet Take 2.5 mg by mouth 3 (three) times a week. (Tuesday, Thursday and Saturday)    . ipratropium (ATROVENT) 0.03 % nasal spray Place 2 sprays into both nostrils 2 (two) times daily as needed for rhinitis.    Marland Kitchen latanoprost (XALATAN) 0.005 % ophthalmic solution Place 1 drop into both eyes at bedtime.     . lidocaine-prilocaine (EMLA) cream Apply 1 application topically every Monday, Wednesday, and Friday with hemodialysis.   3  . loratadine (CLARITIN) 10 MG tablet Take 10 mg by mouth daily.    Marland Kitchen LORazepam (ATIVAN) 0.5 MG tablet     . metoprolol tartrate (LOPRESSOR) 25 MG tablet     .  midodrine (PROAMATINE) 10 MG tablet     . Netarsudil-Latanoprost (ROCKLATAN) 0.02-0.005 % SOLN Apply 1 drop to eye at bedtime.    Marland Kitchen omeprazole (PRILOSEC) 40 MG capsule     . ondansetron (ZOFRAN) 4 MG tablet Take 4 mg by mouth every 6 (six) hours as needed for nausea.     Marland Kitchen rOPINIRole (REQUIP) 2 MG tablet Take 1 tablet (2 mg total) by mouth daily. 30 tablet 3  . simethicone (MYLICON) 938 MG chewable tablet Chew 125 mg by mouth 3 (three) times daily.    . timolol (TIMOPTIC) 0.5 % ophthalmic solution Place 1 drop into both eyes 2 (two) times daily. 10 mL 1  . traZODone (DESYREL) 50 MG tablet Take 25 mg by mouth at bedtime.    . vitamin C (ASCORBIC ACID) 250 MG tablet Take 250 mg by mouth 2 (two) times daily.    . ziprasidone (GEODON) 20 MG capsule Take 20 mg by mouth at bedtime.    . pseudoephedrine (SUDOGEST) 30 MG tablet Take  30 mg by mouth in the morning and at bedtime. x1 week (Patient not taking: Reported on 02/28/2020)    . traMADol (ULTRAM) 50 MG tablet Take 1 tablet (50 mg total) by mouth every 6 (six) hours as needed. (Patient not taking: Reported on 02/13/2020) 20 tablet 0   No current facility-administered medications for this visit.    Allergies as of 02/28/2020 - Review Complete 02/28/2020  Allergen Reaction Noted  . Morphine and related Shortness Of Breath 10/09/2016  . Gabapentin  01/26/2019  . Indomethacin Hives 05/10/2013  . Pollen extract Other (See Comments) 12/25/2014    ROS:  General: Negative for anorexia, weight loss, fever, chills, fatigue, weakness. ENT: Negative for hoarseness, difficulty swallowing , nasal congestion. CV: Negative for chest pain, angina, palpitations, dyspnea on exertion, peripheral edema.  Respiratory: Negative for dyspnea at rest, dyspnea on exertion, cough, sputum, wheezing.  GI: See history of present illness. GU:  Negative for dysuria, hematuria, urinary incontinence, urinary frequency, nocturnal urination.  Endo: Negative for unusual weight change.    Physical Examination:   BP (!) 156/67   Pulse 65   Ht 5\' 3"  (1.6 m)   Wt 145 lb 12.8 oz (66.1 kg)   BMI 25.83 kg/m   General: Well-nourished, well-developed in no acute distress.  Eyes: No icterus. Conjunctivae pink. Lungs: Clear to auscultation bilaterally. Non-labored. Heart: Regular rate and rhythm, no murmurs rubs or gallops.  Abdomen: Bowel sounds are normal, nontender, nondistended, no hepatosplenomegaly or masses, no abdominal bruits or hernia , no rebound or guarding.   Extremities: No lower extremity edema. No clubbing or deformities. Neuro: Alert and oriented x 3.  Grossly intact. Skin: Warm and dry, no jaundice.   Psych: Alert and cooperative, normal mood and affect.  Labs:    Imaging Studies: VAS US DUPLEX DIALYSIS ACCESS (AVF, AVG)  Result Date: 02/16/2020 DIALYSIS ACCESS Reason for  Exam: Routine follow up. Access Site: Left Upper Extremity. Access Type: Brachial-cephalic AVF. History: 12/22/2018 - PTA and new CVS stent added          11/14/14: Left brachial-cephalic AVF creation;          18/2/99: Mid cephalic vein PTA;          04/14/17: Shoulder level cephalic vein PTA/stent;          37/16/96: Cephalic vein PTA at confluence;          11/02/17: Left upper arm cephalic vein & confluence stent;. Comparison Study:  07/18/2019 Performing Technologist: Concha Norway RVT  Examination Guidelines: A complete evaluation includes B-mode imaging, spectral Doppler, color Doppler, and power Doppler as needed of all accessible portions of each vessel. Unilateral testing is considered an integral part of a complete examination. Limited examinations for reoccurring indications may be performed as noted.  Findings: +--------------------+----------+-----------------+--------+ AVF                 PSV (cm/s)Flow Vol (mL/min)Comments +--------------------+----------+-----------------+--------+ Native artery inflow   202          [id number]                +--------------------+----------+-----------------+--------+ AVF Anastomosis        193                              +--------------------+----------+-----------------+--------+  +---------------+----------+-------------+----------+--------------------------+ OUTFLOW VEIN   PSV (cm/s)Diameter (cm)Depth (cm)         Describe          +---------------+----------+-------------+----------+--------------------------+ Subclavian vein   125                                                      +---------------+----------+-------------+----------+--------------------------+ Confluence        217                                                      +---------------+----------+-------------+----------+--------------------------+ Shoulder          192                                     stent             +---------------+----------+-------------+----------+--------------------------+ Prox UA           193                                     stent            +---------------+----------+-------------+----------+--------------------------+ Mid UA            407        1.41               pre stent 1.41cm and stent +---------------+----------+-------------+----------+--------------------------+ Dist UA           107        1.81                                          +---------------+----------+-------------+----------+--------------------------+  +---------------+------------+----------+---------+--------+------------------+                  Diameter  Depth (cm)Branching  PSV      Flow Volume                        (cm)                        (  cm/s)      (ml/min)      +---------------+------------+----------+---------+--------+------------------+ Left Rad Art                                     71                      Dis                                                                      +---------------+------------+----------+---------+--------+------------------+ Antegrade                                                                +---------------+------------+----------+---------+--------+------------------+  Summary: Patent left BrachioCephalic AVF with patent mid to distal stent. No evidence of significant stenosis. AVF dilated to 1.41cm pre-stent.  *See table(s) above for measurements and observations.  Diagnosing physician: [name] MD Electronically signed by [name] MD on 02/16/2020 at 5:02:37 PM.   --------------------------------------------------------------------------------   Final     Assessment and Plan:   Joyce [name] is a 74 y.o. y/o female who comes in today with a history of diarrhea and nausea.  The patient had the diarrhea all the way back to 2017 without any cause seen on her colonoscopy.  The patient reports that  her diarrhea is better now.  Her main concern today is the nausea.  The patient has been told that reflux especially when laying down at night can result in nausea in the morning.  Therefore she has been told to try and take her PPI in the evening so that she has maximum acid suppression while she is laying down.  The patient has been told that if this does not solve her symptoms that she should contact my office.  The patient has been explained the plan agrees with it.     Lucilla Lame, MD. Mar[facility] Regal    Note: This dictation was prepared with Dragon dictation along with smaller phrase technology. Any transcriptional errors that result from this process are unintentional.

## 2020-06-05 ENCOUNTER — Telehealth (INDEPENDENT_AMBULATORY_CARE_PROVIDER_SITE_OTHER): Payer: Self-pay

## 2020-06-05 NOTE — Telephone Encounter (Signed)
I attempted to contact someone at [facility] regarding getting the patient scheduled for a procedure and had to leave a message for a return call.

## 2020-06-05 NOTE — Telephone Encounter (Signed)
Patient returned my call and is scheduled with Dr. Delana Meyer for a LUE fistulagram on 06/12/20 with a 9:15 am arrival time to the [facility]. Covid testing on 06/08/20 between 8-1 pm at the [facility]. Pre-procedure instructions were discussed and will be mailed.

## 2020-06-08 ENCOUNTER — Other Ambulatory Visit: Payer: Self-pay

## 2020-06-08 ENCOUNTER — Other Ambulatory Visit
Admission: RE | Admit: 2020-06-08 | Discharge: 2020-06-08 | Disposition: A | Discharge status: Home / Self Care | Payer: Medicare HMO | Source: Ambulatory Visit | Attending: Vascular Surgery | Admitting: Vascular Surgery

## 2020-06-08 DIAGNOSIS — Z20822 Contact with and (suspected) exposure to covid-19: Secondary | ICD-10-CM | POA: Insufficient documentation

## 2020-06-08 DIAGNOSIS — Z01812 Encounter for preprocedural laboratory examination: Secondary | ICD-10-CM | POA: Insufficient documentation

## 2020-06-09 LAB — SARS CORONAVIRUS 2 (TAT 6-24 HRS): SARS Coronavirus 2: NEGATIVE

## 2020-06-12 ENCOUNTER — Other Ambulatory Visit (INDEPENDENT_AMBULATORY_CARE_PROVIDER_SITE_OTHER): Payer: Self-pay | Admitting: Nurse Practitioner

## 2020-06-12 ENCOUNTER — Encounter: Payer: Self-pay | Admitting: Vascular Surgery

## 2020-06-12 ENCOUNTER — Ambulatory Visit
Admission: RE | Admit: 2020-06-12 | Discharge: 2020-06-12 | Disposition: A | Discharge status: Home / Self Care | Payer: Medicare HMO | Attending: Vascular Surgery | Admitting: Vascular Surgery

## 2020-06-12 ENCOUNTER — Encounter
Admission: RE | Disposition: A | Discharge status: Home / Self Care | Payer: Self-pay | Source: Home / Self Care | Attending: Vascular Surgery

## 2020-06-12 ENCOUNTER — Other Ambulatory Visit: Payer: Self-pay

## 2020-06-12 DIAGNOSIS — Z992 Dependence on renal dialysis: Secondary | ICD-10-CM | POA: Insufficient documentation

## 2020-06-12 DIAGNOSIS — K219 Gastro-esophageal reflux disease without esophagitis: Secondary | ICD-10-CM | POA: Insufficient documentation

## 2020-06-12 DIAGNOSIS — Z79899 Other long term (current) drug therapy: Secondary | ICD-10-CM | POA: Insufficient documentation

## 2020-06-12 DIAGNOSIS — I120 Hypertensive chronic kidney disease with stage 5 chronic kidney disease or end stage renal disease: Secondary | ICD-10-CM | POA: Insufficient documentation

## 2020-06-12 DIAGNOSIS — N186 End stage renal disease: Secondary | ICD-10-CM | POA: Insufficient documentation

## 2020-06-12 DIAGNOSIS — Z885 Allergy status to narcotic agent status: Secondary | ICD-10-CM | POA: Diagnosis not present

## 2020-06-12 DIAGNOSIS — Y841 Kidney dialysis as the cause of abnormal reaction of the patient, or of later complication, without mention of misadventure at the time of the procedure: Secondary | ICD-10-CM | POA: Diagnosis not present

## 2020-06-12 DIAGNOSIS — T82898A Other specified complication of vascular prosthetic devices, implants and grafts, initial encounter: Secondary | ICD-10-CM | POA: Diagnosis not present

## 2020-06-12 DIAGNOSIS — Z888 Allergy status to other drugs, medicaments and biological substances status: Secondary | ICD-10-CM | POA: Diagnosis not present

## 2020-06-12 DIAGNOSIS — T82858A Stenosis of vascular prosthetic devices, implants and grafts, initial encounter: Secondary | ICD-10-CM | POA: Insufficient documentation

## 2020-06-12 HISTORY — PX: A/V FISTULAGRAM: CATH118298

## 2020-06-12 LAB — POTASSIUM (ARMC VASCULAR LAB ONLY): Potassium (ARMC vascular lab): 4.4 (ref 3.5–5.1)

## 2020-06-12 SURGERY — A/V FISTULAGRAM
Anesthesia: Moderate Sedation | Site: Arm Upper | Laterality: Left

## 2020-06-12 MED ORDER — FENTANYL CITRATE (PF) 100 MCG/2ML IJ SOLN
INTRAMUSCULAR | Status: AC
  Filled 2020-06-12: qty 2

## 2020-06-12 MED ORDER — FENTANYL CITRATE (PF) 100 MCG/2ML IJ SOLN: 12.5000 ug | Freq: Once | INTRAMUSCULAR | Status: DC | PRN

## 2020-06-12 MED ORDER — METHYLPREDNISOLONE SODIUM SUCC 125 MG IJ SOLR: 125.0000 mg | Freq: Once | INTRAMUSCULAR | Status: DC | PRN

## 2020-06-12 MED ORDER — FAMOTIDINE 20 MG PO TABS: 40.0000 mg | ORAL_TABLET | Freq: Once | ORAL | Status: DC | PRN

## 2020-06-12 MED ORDER — CEFAZOLIN SODIUM-DEXTROSE 1-4 GM/50ML-% IV SOLN
INTRAVENOUS | Status: AC
  Administered 2020-06-12: 1 g via INTRAVENOUS
  Filled 2020-06-12: qty 50

## 2020-06-12 MED ORDER — CEFAZOLIN SODIUM-DEXTROSE 1-4 GM/50ML-% IV SOLN: 1.0000 g | Freq: Once | INTRAVENOUS | Status: AC

## 2020-06-12 MED ORDER — SODIUM CHLORIDE 0.9 % IV SOLN
INTRAVENOUS | Status: DC
  Administered 2020-06-12: 10 mL via INTRAVENOUS

## 2020-06-12 MED ORDER — ONDANSETRON HCL 4 MG/2ML IJ SOLN
INTRAMUSCULAR | Status: AC
  Filled 2020-06-12: qty 2

## 2020-06-12 MED ORDER — MIDAZOLAM HCL 2 MG/2ML IJ SOLN
INTRAMUSCULAR | Status: DC | PRN
  Administered 2020-06-12: 2 mg via INTRAVENOUS

## 2020-06-12 MED ORDER — DIPHENHYDRAMINE HCL 50 MG/ML IJ SOLN: 50.0000 mg | Freq: Once | INTRAMUSCULAR | Status: DC | PRN

## 2020-06-12 MED ORDER — MIDAZOLAM HCL 2 MG/ML PO SYRP: 8.0000 mg | ORAL_SOLUTION | Freq: Once | ORAL | Status: DC | PRN

## 2020-06-12 MED ORDER — IODIXANOL 320 MG/ML IV SOLN
INTRAVENOUS | Status: DC | PRN
  Administered 2020-06-12 (×2): 15 mL

## 2020-06-12 MED ORDER — HEPARIN SODIUM (PORCINE) 1000 UNIT/ML IJ SOLN
INTRAMUSCULAR | Status: AC
  Filled 2020-06-12: qty 1

## 2020-06-12 MED ORDER — ONDANSETRON HCL 4 MG/2ML IJ SOLN
4.0000 mg | Freq: Four times a day (QID) | INTRAMUSCULAR | Status: DC | PRN
  Administered 2020-06-12: 4 mg via INTRAVENOUS

## 2020-06-12 MED ORDER — FENTANYL CITRATE (PF) 100 MCG/2ML IJ SOLN
INTRAMUSCULAR | Status: DC | PRN
  Administered 2020-06-12: 25 ug via INTRAVENOUS
  Administered 2020-06-12: 50 ug via INTRAVENOUS

## 2020-06-12 MED ORDER — HEPARIN SODIUM (PORCINE) 1000 UNIT/ML IJ SOLN
INTRAMUSCULAR | Status: DC | PRN
  Administered 2020-06-12: 3000 [IU] via INTRAVENOUS

## 2020-06-12 MED ORDER — MIDAZOLAM HCL 5 MG/5ML IJ SOLN
INTRAMUSCULAR | Status: AC
  Filled 2020-06-12: qty 5

## 2020-06-12 SURGICAL SUPPLY — 14 items
BALLN DORADO 10X40X80 (BALLOONS) ×2
BALLN DORADO 8X40X80 (BALLOONS) ×2
BALLOON DORADO 10X40X80 (BALLOONS) ×1 IMPLANT
BALLOON DORADO 8X40X80 (BALLOONS) ×1 IMPLANT
DRAPE BRACHIAL (DRAPES) ×2 IMPLANT
KIT ENCORE 26 ADVANTAGE (KITS) ×2 IMPLANT
NEEDLE ENTRY 21GA 7CM ECHOTIP (NEEDLE) ×2 IMPLANT
PACK ANGIOGRAPHY (CUSTOM PROCEDURE TRAY) ×2 IMPLANT
SET INTRO CAPELLA COAXIAL (SET/KITS/TRAYS/PACK) ×2 IMPLANT
SHEATH BRITE TIP 6FRX5.5 (SHEATH) ×2 IMPLANT
SUT MNCRL 4-0 (SUTURE) ×2
SUT MNCRL 4-0 27XMFL (SUTURE) ×2
SUTURE MNCRL 4-0 27XMF (SUTURE) ×2 IMPLANT
WIRE MAGIC TOR.035 180C (WIRE) ×2 IMPLANT

## 2020-06-12 NOTE — Interval H&P Note (Signed)
History and Physical Interval Note:  06/12/2020 12:40 PM  Joyce [name]  has presented today for surgery, with the diagnosis of LT upper extremity fistulagram   End Stage RenalCovid  Nov 19.  The various methods of treatment have been discussed with the patient and family. After consideration of risks, benefits and other options for treatment, the patient has consented to  Procedure(s): A/V FISTULAGRAM (Left) as a surgical intervention.  The patient's history has been reviewed, patient examined, no change in status, stable for surgery.  I have reviewed the patient's chart and labs.  Questions were answered to the patient's satisfaction.     [name]

## 2020-06-12 NOTE — Progress Notes (Signed)
Upon arrival post procedure, pt states she felt "nausea"  4mg  IV Zofran administered. No further c/o's after. Requesting sandwich, tolerated sandwich and gingerale without event. Spoke with Tillie Rung on phone: contact at [facility], updated and reviewed procedure and f/u on Dec 9th.  Patient without further c/o's  +bruit/thrill LUE

## 2020-06-12 NOTE — Progress Notes (Signed)
Patient is a resident of [place] to be called when procedure complete.  Patient receives HD  M-W-F in [place]. LUE fistula, +bruit/thrill, patient states during HD "it bleeds" Lungs CTA  Heart sounds regular.  No c/o's Am K 4.4

## 2020-06-12 NOTE — H&P (Signed)
@LOGO @   MRN : [id number]  Joyce [name] is a 74 y.o. (07/10/1946) female who presents with chief complaint of they are having problems with my access.  History of Present Illness:   The patient presents to [facility] at the request of the dialysis center regarding problem with the dialysis access. Currently the patient is maintained via a left arm access that has had multiple interventions.   The dialysis center notes that there is increased recirculation.   They are also having increased problems with cannulation. The patient denies hand pain or other symptoms consistent with steal phenomena.  No significant arm swelling.  The patient denies redness or swelling at the access site. The patient denies fever or chills at home or while on dialysis.  The patient denies amaurosis fugax or recent TIA symptoms. There are no recent neurological changes noted. The patient denies claudication symptoms or rest pain symptoms. The patient denies history of DVT, PE or superficial thrombophlebitis. The patient denies recent episodes of angina or shortness of breath.   Previous duplex ultrasound from July of this year:  Noninvasive studies show a flow volume of 3572 ml/min with patent brachiocephalic AV fistula including the stents. There are some increased velocities at the region of the AV fistula where the original stents were seen previously.   Current Meds  Medication Sig  . acetaminophen (TYLENOL) 500 MG tablet Take 1,000 mg by mouth 2 (two) times daily as needed (pain.).  Marland Kitchen Ascorbic Acid (VITAMIN C) 125 MG CHEW Chew 250 mg by mouth in the morning and at bedtime.  . brimonidine (ALPHAGAN) 0.15 % ophthalmic solution   . brimonidine (ALPHAGAN) 0.2 % ophthalmic solution Place 1 drop into both eyes 2 (two) times daily.  . calcium acetate (PHOSLO) 667 MG capsule Take 667 mg by mouth 3 (three) times daily with meals.   . carbamazepine (TEGRETOL) 100 MG chewable tablet Chew 100 mg by mouth 2 (two) times  daily. (0600 & 2100)  . dicyclomine (BENTYL) 20 MG tablet Take 20 mg by mouth 3 (three) times daily after meals.  Marland Kitchen doxycycline (VIBRAMYCIN) 100 MG capsule Take 100 mg by mouth 2 (two) times daily.  . famotidine (PEPCID) 20 MG tablet Take 20 mg by mouth daily as needed for heartburn or indigestion.  . fluticasone (FLONASE) 50 MCG/ACT nasal spray Place 1 spray into both nostrils daily.   . haloperidol (HALDOL) 5 MG tablet Take 2.5 mg by mouth 3 (three) times a week. (Tuesday, Thursday and Saturday)  . HYDROcodone-acetaminophen (NORCO/VICODIN) 5-325 MG tablet Take 1 tablet by mouth every 4 (four) hours as needed for moderate pain.  Marland Kitchen ipratropium (ATROVENT) 0.03 % nasal spray Place 2 sprays into both nostrils 2 (two) times daily as needed for rhinitis (congestion).  Marland Kitchen lidocaine-prilocaine (EMLA) cream Apply 1 application topically every Monday, Wednesday, and Friday with hemodialysis.   Marland Kitchen loratadine (CLARITIN) 10 MG tablet Take 10 mg by mouth daily at 6 (six) AM.  . LORazepam (ATIVAN) 0.5 MG tablet Take 0.5 mg by mouth 3 (three) times daily as needed for anxiety.  . metoprolol tartrate (LOPRESSOR) 25 MG tablet Take 25 mg by mouth See admin instructions. Take 1 tablet (25 mg) by mouth twice daily on Sundays, Tuesdays, Thursdays, & Saturdays. Take 1 tablet (25 mg) by daily in the evening on dialysis days Mondays, Wednesdays, & Fridays.  . midodrine (PROAMATINE) 10 MG tablet Take 10 mg by mouth every Monday, Wednesday, and Friday with hemodialysis. May take an additional doses as needed  for SBP<100.  Marland Kitchen Netarsudil-Latanoprost (ROCKLATAN) 0.02-0.005 % SOLN Place 1 drop into both eyes at bedtime.   Marland Kitchen omeprazole (PRILOSEC) 40 MG capsule Take 40 mg by mouth at bedtime.   . ondansetron (ZOFRAN) 4 MG tablet Take 4 mg by mouth every 6 (six) hours as needed for nausea or vomiting.  Marland Kitchen rOPINIRole (REQUIP) 2 MG tablet Take 1 tablet (2 mg total) by mouth daily. (Patient taking differently: Take 2 mg by mouth daily at  6 (six) AM. )  . simethicone (MYLICON) 161 MG chewable tablet Chew 125 mg by mouth in the morning, at noon, and at bedtime.  . timolol (TIMOPTIC) 0.5 % ophthalmic solution Place 1 drop into both eyes 2 (two) times daily.  . traZODone (DESYREL) 50 MG tablet Take 25 mg by mouth at bedtime.  . vitamin B-12 (CYANOCOBALAMIN) 1000 MCG tablet Take 1,000 mcg by mouth daily at 6 (six) AM.  . ziprasidone (GEODON) 20 MG capsule Take 20 mg by mouth at bedtime.    Past Medical History:  Diagnosis Date  . Anal fissure   . Bipolar affective disorder ([place])   . CKD (chronic kidney disease)    Dr Holley Raring [name] 4  . Colon polyps   . Diverticulitis   . Diverticulitis   . Family history of adverse reaction to anesthesia    mom - PONV  . GERD (gastroesophageal reflux disease)   . Headache    migraines - none over 10 yrs  . Heart murmur   . History of hiatal hernia   . Hypertension   . Pancreatitis    Valproic acid  . Vertigo     Past Surgical History:  Procedure Laterality Date  . A/V FISTULAGRAM Left 04/14/2017   Procedure: A/V Fistulagram;  Surgeon: Katha Cabal, MD;  Location: [place] [facility];  Service: Cardiovascular;  Laterality: Left;  . A/V FISTULAGRAM Left 06/09/2017   Procedure: A/V FISTULAGRAM;  Surgeon: Katha Cabal, MD;  Location: [place] [facility];  Service: Cardiovascular;  Laterality: Left;  . A/V FISTULAGRAM Left 11/02/2017   Procedure: A/V FISTULAGRAM;  Surgeon: Algernon Huxley, MD;  Location: [place] [facility];  Service: Cardiovascular;  Laterality: Left;  . A/V FISTULAGRAM Left 12/22/2018   Procedure: A/V FISTULAGRAM;  Surgeon: Katha Cabal, MD;  Location: [place] [facility];  Service: Cardiovascular;  Laterality: Left;  . A/V SHUNT INTERVENTION N/A 11/02/2017   Procedure: A/V SHUNT INTERVENTION;  Surgeon: Algernon Huxley, MD;  Location: [place] [facility];  Service: Cardiovascular;  Laterality: N/A;  . ABDOMINAL HYSTERECTOMY  1990 ?  . AV FISTULA  PLACEMENT  4/30  . BREAST EXCISIONAL BIOPSY Left 1994   neg surgical bx  . CHOLECYSTECTOMY  2003  . COLONOSCOPY  2014   Dr. Jamal Collin  . COLONOSCOPY WITH PROPOFOL N/A 09/24/2015   Procedure: COLONOSCOPY WITH random colon byopies.;  Surgeon: Lucilla Lame, MD;  Location: [place];  Service: Endoscopy;  Laterality: N/A;  . ESOPHAGOGASTRODUODENOSCOPY (EGD) WITH PROPOFOL N/A 09/24/2015   Procedure: ESOPHAGOGASTRODUODENOSCOPY (EGD) ;  Surgeon: Lucilla Lame, MD;  Location: [place];  Service: Endoscopy;  Laterality: N/A;  . EYE SURGERY    . PERIPHERAL VASCULAR CATHETERIZATION N/A 05/29/2015   Procedure: A/V Shuntogram/Fistulagram;  Surgeon: Katha Cabal, MD;  Location: [place] [facility];  Service: Cardiovascular;  Laterality: N/A;  . PERIPHERAL VASCULAR CATHETERIZATION N/A 05/29/2015   Procedure: A/V Shunt Intervention;  Surgeon: Katha Cabal, MD;  Location: [place] [facility];  Service: Cardiovascular;  Laterality: N/A;  . POLYPECTOMY  09/24/2015   Procedure: POLYPECTOMY INTESTINAL;  Surgeon: Lucilla Lame, MD;  Location: [place];  Service: Endoscopy;;  cecal polyp ascending polyp    Social History Social History   Tobacco Use  . Smoking status: Never Smoker  . Smokeless tobacco: Never Used  Vaping Use  . Vaping Use: Never used  Substance Use Topics  . Alcohol use: No    Alcohol/week: 0.0 standard drinks  . Drug use: No    Family History Family History  Problem Relation Age of Onset  . Stroke Father   . Hypertension Father   . Breast cancer Other   . Colon cancer Neg Hx   . Liver disease Neg Hx   . Mental illness Neg Hx     Allergies  Allergen Reactions  . Morphine And Related Shortness Of Breath    Pt reports chest pain and difficulty breathing.  . Gabapentin   . Indomethacin Hives  . Pollen Extract Other (See Comments)    Sinus problems and HA     REVIEW OF SYSTEMS (Negative unless checked)  Constitutional: [] Weight loss  [] Fever   [] Chills Cardiac: [] Chest pain   [] Chest pressure   [] Palpitations   [] Shortness of breath when laying flat   [] Shortness of breath with exertion. Vascular:  [] Pain in legs with walking   [] Pain in legs at rest  [] History of DVT   [] Phlebitis   [] Swelling in legs   [] Varicose veins   [] Non-healing ulcers Pulmonary:   [] Uses home oxygen   [] Productive cough   [] Hemoptysis   [] Wheeze  [] COPD   [] Asthma Neurologic:  [] Dizziness   [] Seizures   [] History of stroke   [] History of TIA  [] Aphasia   [] Vissual changes   [] Weakness or numbness in arm   [] Weakness or numbness in leg Musculoskeletal:   [] Joint swelling   [] Joint pain   [] Low back pain Hematologic:  [] Easy bruising  [] Easy bleeding   [] Hypercoagulable state   [] Anemic Gastrointestinal:  [] Diarrhea   [] Vomiting  [] Gastroesophageal reflux/heartburn   [] Difficulty swallowing. Genitourinary:  [x] Chronic kidney disease   [] Difficult urination  [] Frequent urination   [] Blood in urine Skin:  [] Rashes   [] Ulcers  Psychological:  [] History of anxiety   []  History of major depression.  Physical Examination  Vitals:   06/12/20 0800  BP: (!) 157/69  Pulse: (!) 53  Resp: 18  Temp: 98.1 F (36.7 C)  TempSrc: Oral  SpO2: 96%  Weight: 64.9 kg  Height: 5\' 3"  (1.6 m)   Body mass index is 25.33 kg/m. Gen: WD/WN, NAD Head: [place]/AT, No temporalis wasting.  Ear/Nose/Throat: Hearing grossly intact, nares w/o erythema or drainage Eyes: PER, EOMI, sclera nonicteric.  Neck: Supple, no large masses.   Pulmonary:  Good air movement, no audible wheezing bilaterally, no use of accessory muscles.  Cardiac: RRR, no JVD Vascular: Left arm AV fistula positive for thrill and bruit moderate aneurysmal changes Vessel Right Left  Radial Palpable Palpable  Culture the tip gastrointestinal: Non-distended. No guarding/no peritoneal signs.  Musculoskeletal: M/S 5/5 throughout.  No deformity or atrophy.  Neurologic: CN 2-12 intact. Symmetrical.  Speech is fluent.  Motor exam as listed above. Psychiatric: Judgment intact, Mood & affect appropriate for pt's clinical situation. Dermatologic: No rashes or ulcers noted.  No changes consistent with cellulitis.  CBC Lab Results  Component Value Date   WBC 6.4 05/24/2019   HGB 8.3 (L) 05/24/2019   HCT 24.5 (L) 05/24/2019  MCV 98.4 05/24/2019   PLT 202 05/24/2019    BMET    Component Value Date/Time   NA 137 05/24/2019 0513   NA 139 11/09/2014 1027   K 4.3 05/24/2019 0513   K 4.7 11/09/2014 1027   CL 100 05/24/2019 0513   CL 108 11/09/2014 1027   CO2 27 05/24/2019 0513   CO2 25 11/09/2014 1027   GLUCOSE 96 05/24/2019 0513   GLUCOSE 100 (H) 11/09/2014 1027   BUN 28 (H) 05/24/2019 0513   BUN 51 (H) 11/09/2014 1027   CREATININE 4.46 (H) 05/24/2019 0513   CREATININE 2.98 (H) 11/09/2014 1027   CALCIUM 8.3 (L) 05/24/2019 0513   CALCIUM 8.9 11/09/2014 1027   GFRNONAA 9 (L) 05/24/2019 0513   GFRNONAA 15 (L) 11/09/2014 1027   GFRAA 11 (L) 05/24/2019 0513   GFRAA 18 (L) 11/09/2014 1027   CrCl cannot be calculated (Patient's most recent lab result is older than the maximum 21 days allowed.).  COAG Lab Results  Component Value Date   INR 1.08 10/03/2016   INR 1.0 11/09/2014    Radiology No results found.  Assessment/Plan 1. Complication of vascular access for dialysis, sequela Recommend:  The patient is experiencing increasing problems with their dialysis access.  Patient should have a fistulagram with the intention for intervention.  The intention for intervention is to restore appropriate flow and prevent thrombosis and possible loss of the access.  As well as improve the quality of dialysis therapy.  The risks, benefits and alternative therapies were reviewed in detail with the patient.  All questions were answered.  The patient agrees to proceed with angio/intervention.    2. ESRD on dialysis ([place]) At the present time the patient has adequate dialysis access.  Continue  hemodialysis as ordered without interruption.  Avoid nephrotoxic medications and dehydration.  Further plans per nephrology  3. Essential hypertension Continue antihypertensive medications as already ordered, these medications have been reviewed and there are no changes at this time.  4. Gastro-esophageal reflux disease without esophagitis Continue PPI as already ordered, this medication has been reviewed and there are no changes at this time.  Avoidence of caffeine and alcohol  Moderate elevation of the head of the bed   [name], MD  06/12/2020 9:52 AM

## 2020-06-12 NOTE — Op Note (Signed)
OPERATIVE NOTE   PROCEDURE: 1. Contrast injection left brachiocephalic AV access 2. Percutaneous transluminal angioplasty peripheral segment left brachiocephalic fistula into areas  PRE-OPERATIVE DIAGNOSIS: Complication of dialysis access                                                       End Stage Renal Disease  POST-OPERATIVE DIAGNOSIS: same as above   SURGEON: Katha Cabal, M.D.  ANESTHESIA: Conscious sedation was administered under my direct supervision by the interventional radiology RN. IV Versed plus fentanyl were utilized. Continuous ECG, pulse oximetry and blood pressure was monitored throughout the entire procedure.  Conscious sedation was for a total of 34 minutes and 38 seconds.  ESTIMATED BLOOD LOSS: minimal  FINDING(S): Stricture of the AV graft  SPECIMEN(S):  None  CONTRAST: 15 cc  FLUOROSCOPY TIME: 2.0 minutes  INDICATIONS: Joyce [name] is a 74 y.o. female who  presents with malfunctioning left arm AV access.  The patient is scheduled for angiography with possible intervention of the AV access.  The patient is aware the risks include but are not limited to: bleeding, infection, thrombosis of the cannulated access, and possible anaphylactic reaction to the contrast.  The patient acknowledges if the access can not be salvaged a tunneled catheter will be needed and will be placed during this procedure.  The patient is aware of the risks of the procedure and elects to proceed with the angiogram and intervention.  DESCRIPTION: After full informed written consent was obtained, the patient was brought back to the Special Procedure suite and placed supine position.  Appropriate cardiopulmonary monitors were placed.  The left arm was prepped and draped in the standard fashion.  Appropriate timeout is called. The left brachiocephalic fistula was cannulated with a micropuncture needle.  Cannulation was performed with ultrasound guidance. Ultrasound was placed in a  sterile sleeve, the AV access was interrogated and noted to be echolucent and compressible indicating patency. Image was recorded for the permanent record. The puncture is performed under continuous ultrasound visualization.   The microwire was advanced and the needle was exchanged for  a microsheath.  The J-wire was then advanced and a 6 Fr sheath inserted.  Hand injections were completed to image the access from the arterial anastomosis through the entire access.  The central venous structures were also imaged by hand injections.  Diagnostic interpretation: The initial portion of the fistula is aneurysmal it has not changed significantly in size compared to her previous study.  At the level of the proximal humerus previously placed Viabahn stents are noted.  At the leading edge there appears to be a greater than 80% stenosis.  Throughout the stented segment itself is widely patent.  The outflow at the subclavian cephalic confluence appears to have approximately a 50% stenosis.  The central veins are all widely patent.  Arterial inflow is patent.  Based on the images,  3000 units of heparin was given and a wire was negotiated through the strictures within the venous portion of the graft.  An 8 mm x 40 mm Dorado balloon was used initially.  Inflation was to 16 to 20 atm for 1 minute at both the proximal and leading edges of the Viabahn stents.  Next, a 10 mm x 40 mm Dorado balloon was used to angioplasty the leading edge of the  Viabahn stent in its midportion.  Follow-up imaging demonstrates complete resolution of the stricture with rapid flow of contrast through the graft, the central venous anatomy is preserved.  By palpation there is now a continuous thrill although given the aneurysmal nature of her fistula there is still moderate pulsatility this is significantly lessened compared to the preintervention status  A 4-0 Monocryl purse-string suture was sewn around the sheath.  The sheath was removed and  light pressure was applied.  A sterile bandage was applied to the puncture site.    COMPLICATIONS: None  CONDITION: Joyce [name], M.D [facility] Office: [phone]  06/12/2020 11:15 AM

## 2020-06-12 NOTE — Interval H&P Note (Signed)
History and Physical Interval Note:  06/12/2020 12:39 PM  Joyce [name]  has presented today for surgery, with the diagnosis of LT upper extremity fistulagram   End Stage RenalCovid  Nov 19.  The various methods of treatment have been discussed with the patient and family. After consideration of risks, benefits and other options for treatment, the patient has consented to  Procedure(s): A/V FISTULAGRAM (Left) as a surgical intervention.  The patient's history has been reviewed, patient examined, no change in status, stable for surgery.  I have reviewed the patient's chart and labs.  Questions were answered to the patient's satisfaction.     [name]

## 2020-06-22 ENCOUNTER — Other Ambulatory Visit (INDEPENDENT_AMBULATORY_CARE_PROVIDER_SITE_OTHER): Payer: Self-pay | Admitting: Vascular Surgery

## 2020-06-22 DIAGNOSIS — T829XXS Unspecified complication of cardiac and vascular prosthetic device, implant and graft, sequela: Secondary | ICD-10-CM

## 2020-06-22 DIAGNOSIS — Z9862 Peripheral vascular angioplasty status: Secondary | ICD-10-CM

## 2020-06-22 DIAGNOSIS — N186 End stage renal disease: Secondary | ICD-10-CM

## 2020-06-28 ENCOUNTER — Ambulatory Visit (INDEPENDENT_AMBULATORY_CARE_PROVIDER_SITE_OTHER): Payer: Medicare HMO

## 2020-06-28 ENCOUNTER — Other Ambulatory Visit: Payer: Self-pay

## 2020-06-28 ENCOUNTER — Encounter (INDEPENDENT_AMBULATORY_CARE_PROVIDER_SITE_OTHER): Payer: Self-pay | Admitting: Nurse Practitioner

## 2020-06-28 ENCOUNTER — Ambulatory Visit (INDEPENDENT_AMBULATORY_CARE_PROVIDER_SITE_OTHER): Payer: Medicare HMO | Admitting: Nurse Practitioner

## 2020-06-28 VITALS — BP 172/61 | HR 50 | Resp 16 | Wt 140.0 lb

## 2020-06-28 DIAGNOSIS — I10 Essential (primary) hypertension: Secondary | ICD-10-CM

## 2020-06-28 DIAGNOSIS — N186 End stage renal disease: Secondary | ICD-10-CM

## 2020-06-28 DIAGNOSIS — Z9862 Peripheral vascular angioplasty status: Secondary | ICD-10-CM

## 2020-06-28 DIAGNOSIS — T829XXS Unspecified complication of cardiac and vascular prosthetic device, implant and graft, sequela: Secondary | ICD-10-CM | POA: Diagnosis not present

## 2020-07-02 ENCOUNTER — Encounter (INDEPENDENT_AMBULATORY_CARE_PROVIDER_SITE_OTHER): Payer: Self-pay | Admitting: Nurse Practitioner

## 2020-07-02 NOTE — Progress Notes (Signed)
Subjective:    Patient ID: Joyce [name], female    DOB: Apr 11, 1946, 74 y.o.   MRN: [id number] Chief Complaint  Patient presents with  . Follow-up    [facility] 2wk post av fistulagram    The patient returns to the office for followup status post intervention of the dialysis access left brachiocephalic AV fistula. Following the intervention the access function has significantly improved, with better flow rates and improved KT/V. The patient has not been experiencing increased bleeding times following decannulation and the patient denies increased recirculation. The patient denies an increase in arm swelling. At the present time the patient denies hand pain.  The patient denies amaurosis fugax or recent TIA symptoms. There are no recent neurological changes noted. The patient denies claudication symptoms or rest pain symptoms. The patient denies history of DVT, PE or superficial thrombophlebitis. The patient denies recent episodes of angina or shortness of breath.   Today the patient has a flow volume of 3610.  This is slightly increased from her previous studies on 02/13/2020.  The previously placed stents are patent.  Her brachiocephalic AV fistula has no evidence of restenosis currently.        Review of Systems  Hematological: Does not bruise/bleed easily.  All other systems reviewed and are negative.      Objective:   Physical Exam Vitals reviewed.  Cardiovascular:     Rate and Rhythm: Normal rate.     Pulses: Normal pulses.          Radial pulses are 2+ on the left side.     Arteriovenous access: left arteriovenous access is present.    Comments: Left brachiocephalic AV fistula with good thrill and bruit Pulmonary:     Effort: Pulmonary effort is normal.  Neurological:     Mental Status: She is alert and oriented to person, place, and time.  Psychiatric:        Mood and Affect: Mood normal.        Behavior: Behavior normal.        Thought Content: Thought content  normal.        Judgment: Judgment normal.     BP (!) 172/61 (BP Location: Right Arm)   Pulse (!) 50   Resp 16   Wt 140 lb (63.5 kg)   BMI 24.80 kg/m   Past Medical History:  Diagnosis Date  . Anal fissure   . Bipolar affective disorder ([place])   . CKD (chronic kidney disease)    Dr Holley Raring [name] 4  . Colon polyps   . Diverticulitis   . Diverticulitis   . Family history of adverse reaction to anesthesia    mom - PONV  . GERD (gastroesophageal reflux disease)   . Headache    migraines - none over 10 yrs  . Heart murmur   . History of hiatal hernia   . Hypertension   . Pancreatitis    Valproic acid  . Vertigo     Social History   Socioeconomic History  . Marital status: Divorced    Spouse name: Not on file  . Number of children: 1  . Years of education: Not on file  . Highest education level: Not on file  Occupational History  . 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 Smoking status: Never Smoker  . Smokeless tobacco: Never Used  Vaping Use  . Vaping Use: Never used  Substance and Sexual Activity  .  Alcohol use: No    Alcohol/week: 0.0 standard drinks  . Drug use: No  . Sexual activity: Not Currently  Other Topics Concern  . 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   Social Determinants of Health   Financial Resource Strain: Not on file  Food Insecurity: Not on file  Transportation Needs: Not on file  Physical Activity: Not on file  Stress: Not on file  Social Connections: Not on file  Intimate Partner Violence: Not on file    Past Surgical History:  Procedure Laterality Date  . A/V FISTULAGRAM Left 04/14/2017   Procedure: A/V Fistulagram;  Surgeon: Katha Cabal, MD;  Location: [place] [facility];  Service: Cardiovascular;  Laterality: Left;  . A/V FISTULAGRAM Left 06/09/2017   Procedure: A/V FISTULAGRAM;   Surgeon: Katha Cabal, MD;  Location: [place] [facility];  Service: Cardiovascular;  Laterality: Left;  . A/V FISTULAGRAM Left 11/02/2017   Procedure: A/V FISTULAGRAM;  Surgeon: Algernon Huxley, MD;  Location: [place] [facility];  Service: Cardiovascular;  Laterality: Left;  . A/V FISTULAGRAM Left 12/22/2018   Procedure: A/V FISTULAGRAM;  Surgeon: Katha Cabal, MD;  Location: [place] [facility];  Service: Cardiovascular;  Laterality: Left;  . A/V FISTULAGRAM Left 06/12/2020   Procedure: A/V FISTULAGRAM;  Surgeon: Katha Cabal, MD;  Location: [place] [facility];  Service: Cardiovascular;  Laterality: Left;  . A/V SHUNT INTERVENTION N/A 11/02/2017   Procedure: A/V SHUNT INTERVENTION;  Surgeon: Algernon Huxley, MD;  Location: [place] [facility];  Service: Cardiovascular;  Laterality: N/A;  . ABDOMINAL HYSTERECTOMY  1990 ?  . AV FISTULA PLACEMENT  4/30  . BREAST EXCISIONAL BIOPSY Left 1994   neg surgical bx  . CHOLECYSTECTOMY  2003  . COLONOSCOPY  2014   Dr. Jamal Collin  . COLONOSCOPY WITH PROPOFOL N/A 09/24/2015   Procedure: COLONOSCOPY WITH random colon byopies.;  Surgeon: Lucilla Lame, MD;  Location: [place];  Service: Endoscopy;  Laterality: N/A;  . ESOPHAGOGASTRODUODENOSCOPY (EGD) WITH PROPOFOL N/A 09/24/2015   Procedure: ESOPHAGOGASTRODUODENOSCOPY (EGD) ;  Surgeon: Lucilla Lame, MD;  Location: [place];  Service: Endoscopy;  Laterality: N/A;  . EYE SURGERY    . PERIPHERAL VASCULAR CATHETERIZATION N/A 05/29/2015   Procedure: A/V Shuntogram/Fistulagram;  Surgeon: Katha Cabal, MD;  Location: [place] [facility];  Service: Cardiovascular;  Laterality: N/A;  . PERIPHERAL VASCULAR CATHETERIZATION N/A 05/29/2015   Procedure: A/V Shunt Intervention;  Surgeon: Katha Cabal, MD;  Location: [place] [facility];  Service: Cardiovascular;  Laterality: N/A;  . POLYPECTOMY  09/24/2015   Procedure: POLYPECTOMY INTESTINAL;  Surgeon: Lucilla Lame, MD;  Location: [place];  Service: Endoscopy;;  cecal polyp ascending polyp    Family History  Problem Relation Age of Onset  . Stroke Father   . Hypertension Father   . Breast cancer Other   . Colon cancer Neg Hx   . Liver disease Neg Hx   . Mental illness Neg Hx     Allergies  Allergen Reactions  . Morphine And Related Shortness Of Breath    Pt reports chest pain and difficulty breathing.  . Gabapentin   . Indomethacin Hives  . Pollen Extract Other (See Comments)    Sinus problems and HA    CBC Latest Ref Rng & Units 05/24/2019 05/23/2019 05/22/2019  WBC 4.0 - 10.5 K/uL 6.4 7.0  6.7  Hemoglobin 12.0 - 15.0 g/dL 8.3(L) 8.3(L) 8.6(L)  Hematocrit 36.0 - 46.0 % 24.5(L) 24.3(L) 25.5(L)  Platelets 150 - 400 K/uL 202 191 203      CMP     Component Value Date/Time   NA 137 05/24/2019 0513   NA 139 11/09/2014 1027   K 4.3 05/24/2019 0513   K 4.7 11/09/2014 1027   CL 100 05/24/2019 0513   CL 108 11/09/2014 1027   CO2 27 05/24/2019 0513   CO2 25 11/09/2014 1027   GLUCOSE 96 05/24/2019 0513   GLUCOSE 100 (H) 11/09/2014 1027   BUN 28 (H) 05/24/2019 0513   BUN 51 (H) 11/09/2014 1027   CREATININE 4.46 (H) 05/24/2019 0513   CREATININE 2.98 (H) 11/09/2014 1027   CALCIUM 8.3 (L) 05/24/2019 0513   CALCIUM 8.9 11/09/2014 1027   PROT 5.6 (L) 05/24/2019 0513   PROT 5.3 (L) 05/14/2014 0412   ALBUMIN 3.3 (L) 05/24/2019 0513   ALBUMIN 2.7 (L) 05/14/2014 0412   AST 14 (L) 05/24/2019 0513   AST 18 05/14/2014 0412   ALT 10 05/24/2019 0513   ALT 25 05/14/2014 0412   ALKPHOS 55 05/24/2019 0513   ALKPHOS 53 05/14/2014 0412   BILITOT 0.8 05/24/2019 0513   BILITOT 0.5 05/14/2014 0412   GFRNONAA 9 (L) 05/24/2019 0513   GFRNONAA 15 (L) 11/09/2014 1027   GFRAA 11 (L) 05/24/2019 0513   GFRAA 18 (L) 11/09/2014 1027     No results found.     Assessment & Plan:   1. ESRD (end stage renal disease) ([place]) Recommend:  The patient is doing well and currently has adequate dialysis access. The  patient's dialysis center is not reporting any access issues. Flow pattern is stable when compared to the prior ultrasound.  The patient should have a duplex ultrasound of the dialysis access in 6 months. The patient will follow-up with me in the office after each ultrasound     2. Essential hypertension Continue antihypertensive medications as already ordered, these medications have been reviewed and there are no changes at this time.    Current Outpatient Medications on File Prior to Visit  Medication Sig Dispense Refill  . acetaminophen (TYLENOL) 500 MG tablet Take 1,000 mg by mouth 2 (two) times daily as needed (pain.).    Marland Kitchen Ascorbic Acid (VITAMIN C) 125 MG CHEW Chew 250 mg by mouth in the morning and at bedtime.    Marland Kitchen b complex-vitamin c-folic acid (NEPHRO-VITE) 0.8 MG TABS tablet Take 1 tablet by mouth daily.    . brimonidine (ALPHAGAN) 0.15 % ophthalmic solution     . brimonidine (ALPHAGAN) 0.2 % ophthalmic solution Place 1 drop into both eyes 2 (two) times daily. 5 mL 1  . calcium acetate (PHOSLO) 667 MG capsule Take 667 mg by mouth 3 (three) times daily with meals.     . carbamazepine (TEGRETOL) 100 MG chewable tablet Chew 100 mg by mouth 2 (two) times daily. (0600 & 2100)    . dicyclomine (BENTYL) 20 MG tablet Take 20 mg by mouth 3 (three) times daily after meals.    Marland Kitchen doxycycline (VIBRAMYCIN) 100 MG capsule Take 100 mg by mouth 2 (two) times daily.    . famotidine (PEPCID) 20 MG tablet Take 20 mg by mouth daily as needed for heartburn or indigestion.    . fluticasone (FLONASE) 50 MCG/ACT nasal spray Place 1 spray into both nostrils daily.     . haloperidol (HALDOL) 5 MG tablet Take 2.5 mg by  mouth 3 (three) times a week. (Tuesday, Thursday and Saturday)    . HYDROcodone-acetaminophen (NORCO/VICODIN) 5-325 MG tablet Take 1 tablet by mouth every 4 (four) hours as needed for moderate pain.    Marland Kitchen ipratropium (ATROVENT) 0.03 % nasal spray Place 2 sprays into both nostrils 2 (two)  times daily as needed for rhinitis (congestion).    Marland Kitchen lidocaine-prilocaine (EMLA) cream Apply 1 application topically every Monday, Wednesday, and Friday with hemodialysis.   3  . loratadine (CLARITIN) 10 MG tablet Take 10 mg by mouth daily at 6 (six) AM.    . LORazepam (ATIVAN) 0.5 MG tablet Take 0.5 mg by mouth 3 (three) times daily as needed for anxiety.    . metoprolol tartrate (LOPRESSOR) 25 MG tablet Take 25 mg by mouth See admin instructions. Take 1 tablet (25 mg) by mouth twice daily on Sundays, Tuesdays, Thursdays, & Saturdays. Take 1 tablet (25 mg) by daily in the evening on dialysis days Mondays, Wednesdays, & Fridays.    . midodrine (PROAMATINE) 10 MG tablet Take 10 mg by mouth every Monday, Wednesday, and Friday with hemodialysis. May take an additional doses as needed for SBP<100.    Marland Kitchen Netarsudil-Latanoprost (ROCKLATAN) 0.02-0.005 % SOLN Place 1 drop into both eyes at bedtime.     Marland Kitchen omeprazole (PRILOSEC) 40 MG capsule Take 40 mg by mouth at bedtime.     . ondansetron (ZOFRAN) 4 MG tablet Take 4 mg by mouth every 6 (six) hours as needed for nausea or vomiting.    Marland Kitchen rOPINIRole (REQUIP) 2 MG tablet Take 1 tablet (2 mg total) by mouth daily. (Patient taking differently: Take 2 mg by mouth daily at 6 (six) AM.) 30 tablet 3  . simethicone (MYLICON) 329 MG chewable tablet Chew 125 mg by mouth in the morning, at noon, and at bedtime.    . timolol (TIMOPTIC) 0.5 % ophthalmic solution Place 1 drop into both eyes 2 (two) times daily. 10 mL 1  . traZODone (DESYREL) 50 MG tablet Take 25 mg by mouth at bedtime.    . vitamin B-12 (CYANOCOBALAMIN) 1000 MCG tablet Take 1,000 mcg by mouth daily at 6 (six) AM.    . ziprasidone (GEODON) 20 MG capsule Take 20 mg by mouth at bedtime.     No current facility-administered medications on file prior to visit.    There are no Patient Instructions on file for this visit. No follow-ups on file.   Kris Hartmann, NP

## 2020-07-16 ENCOUNTER — Emergency Department
Admission: EM | Admit: 2020-07-16 | Discharge: 2020-07-16 | Disposition: A | Discharge status: Home / Self Care | Payer: Medicare HMO | Attending: Emergency Medicine | Admitting: Emergency Medicine

## 2020-07-16 ENCOUNTER — Emergency Department: Payer: Medicare HMO

## 2020-07-16 ENCOUNTER — Other Ambulatory Visit: Payer: Self-pay

## 2020-07-16 ENCOUNTER — Encounter: Payer: Self-pay | Admitting: Emergency Medicine

## 2020-07-16 DIAGNOSIS — I120 Hypertensive chronic kidney disease with stage 5 chronic kidney disease or end stage renal disease: Secondary | ICD-10-CM | POA: Diagnosis not present

## 2020-07-16 DIAGNOSIS — Z85038 Personal history of other malignant neoplasm of large intestine: Secondary | ICD-10-CM | POA: Diagnosis not present

## 2020-07-16 DIAGNOSIS — Z992 Dependence on renal dialysis: Secondary | ICD-10-CM | POA: Insufficient documentation

## 2020-07-16 DIAGNOSIS — N186 End stage renal disease: Secondary | ICD-10-CM | POA: Diagnosis not present

## 2020-07-16 DIAGNOSIS — K2100 Gastro-esophageal reflux disease with esophagitis, without bleeding: Secondary | ICD-10-CM | POA: Insufficient documentation

## 2020-07-16 DIAGNOSIS — Z79899 Other long term (current) drug therapy: Secondary | ICD-10-CM | POA: Insufficient documentation

## 2020-07-16 DIAGNOSIS — Z20822 Contact with and (suspected) exposure to covid-19: Secondary | ICD-10-CM | POA: Diagnosis not present

## 2020-07-16 DIAGNOSIS — M545 Low back pain, unspecified: Secondary | ICD-10-CM | POA: Insufficient documentation

## 2020-07-16 DIAGNOSIS — R55 Syncope and collapse: Secondary | ICD-10-CM

## 2020-07-16 LAB — HEPATIC FUNCTION PANEL
ALT: 9 U/L (ref 0–44)
AST: 13 U/L — ABNORMAL LOW (ref 15–41)
Albumin: 3.7 g/dL (ref 3.5–5.0)
Alkaline Phosphatase: 52 U/L (ref 38–126)
Bilirubin, Direct: <0.1 mg/dL (ref 0.0–0.2)
Total Bilirubin: 0.8 mg/dL (ref 0.3–1.2)
Total Protein: 6.6 g/dL (ref 6.5–8.1)

## 2020-07-16 LAB — URINALYSIS, COMPLETE (UACMP) WITH MICROSCOPIC
Bacteria, UA: NONE SEEN
Bilirubin Urine: NEGATIVE
Glucose, UA: NEGATIVE mg/dL
Hgb urine dipstick: NEGATIVE
Ketones, ur: NEGATIVE mg/dL
Nitrite: NEGATIVE
Protein, ur: NEGATIVE mg/dL
Specific Gravity, Urine: 1.008 (ref 1.005–1.030)
pH: 6 (ref 5.0–8.0)

## 2020-07-16 LAB — BASIC METABOLIC PANEL
Anion gap: 9 (ref 5–15)
BUN: 13 mg/dL (ref 8–23)
CO2: 31 mmol/L (ref 22–32)
Calcium: 8.9 mg/dL (ref 8.9–10.3)
Chloride: 99 mmol/L (ref 98–111)
Creatinine, Ser: 3.34 mg/dL — ABNORMAL HIGH (ref 0.44–1.00)
GFR, Estimated: 14 mL/min — ABNORMAL LOW (ref >60)
Glucose, Bld: 109 mg/dL — ABNORMAL HIGH (ref 70–99)
Potassium: 4 mmol/L (ref 3.5–5.1)
Sodium: 139 mmol/L (ref 135–145)

## 2020-07-16 LAB — CBC
HCT: 32.2 % — ABNORMAL LOW (ref 36.0–46.0)
Hemoglobin: 10.7 g/dL — ABNORMAL LOW (ref 12.0–15.0)
MCH: 32.7 pg (ref 26.0–34.0)
MCHC: 33.2 g/dL (ref 30.0–36.0)
MCV: 98.5 fL (ref 80.0–100.0)
Platelets: 191 10*3/uL (ref 150–400)
RBC: 3.27 MIL/uL — ABNORMAL LOW (ref 3.87–5.11)
RDW: 12.2 % (ref 11.5–15.5)
WBC: 9.6 10*3/uL (ref 4.0–10.5)
nRBC: 0 % (ref 0.0–0.2)

## 2020-07-16 LAB — RESP PANEL BY RT-PCR (FLU A&B, COVID) ARPGX2
Influenza A by PCR: NEGATIVE
Influenza B by PCR: NEGATIVE
SARS Coronavirus 2 by RT PCR: NEGATIVE

## 2020-07-16 LAB — LIPASE, BLOOD: Lipase: 37 U/L (ref 11–51)

## 2020-07-16 LAB — TROPONIN I (HIGH SENSITIVITY): Troponin I (High Sensitivity): 7 ng/L (ref <18)

## 2020-07-16 MED ORDER — LIDOCAINE VISCOUS HCL 2 % MT SOLN
15.0000 mL | Freq: Once | OROMUCOSAL | Status: AC
  Administered 2020-07-16: 15 mL via ORAL
  Filled 2020-07-16: qty 15

## 2020-07-16 MED ORDER — ALUM & MAG HYDROXIDE-SIMETH 200-200-20 MG/5ML PO SUSP
15.0000 mL | Freq: Once | ORAL | Status: AC
  Administered 2020-07-16: 15 mL via ORAL
  Filled 2020-07-16: qty 30

## 2020-07-16 NOTE — ED Triage Notes (Signed)
States "My blood pressure dropped at dialysis and I passed out".  States she is currently feeling weak and dizzy.  States has been feeling sick all last week as well.  AAOx3.  Skin warm and dry. NAD

## 2020-07-16 NOTE — ED Notes (Signed)
Indicates pain is getting worse

## 2020-07-16 NOTE — ED Notes (Signed)
Patient transported to X-ray 

## 2020-07-16 NOTE — ED Provider Notes (Signed)
[facility] Emergency Department Provider Note   ____________________________________________   Event Date/Time   First MD Initiated Contact with Patient 07/16/20 1545     (approximate)  I have reviewed the triage vital signs and the nursing notes.   HISTORY  Chief Complaint Weakness    HPI Joyce [name] is a 74 y.o. female with past medical history of hypertension, ESRD on HD (MWF), GERD, and bipolar disorder who presents to the ED complaining of syncope.  Patient reports that her blood pressure dropped while she was undergoing her dialysis treatment and she passed out.  She states this is happened once before and was attributed to her blood pressure medications, which were changed around by her cardiologist.  She felt weak and dizzy upon arrival to the ED, but this has gradually improved.  She does state that for the past couple of weeks she has been feeling sick in general with occasional nausea and vomiting, diarrhea, and malaise.  She denies any fevers, cough, chest pain, or shortness of breath.  She does state that she has been having bad reflux symptoms recently despite taking a PPI.        Past Medical History:  Diagnosis Date  . Anal fissure   . Bipolar affective disorder ([place])   . CKD (chronic kidney disease)    Dr Holley Raring [name] 4  . Colon polyps   . Diverticulitis   . Diverticulitis   . Family history of adverse reaction to anesthesia    mom - PONV  . GERD (gastroesophageal reflux disease)   . Headache    migraines - none over 10 yrs  . Heart murmur   . History of hiatal hernia   . Hypertension   . Pancreatitis    Valproic acid  . Vertigo     Patient Active Problem List   Diagnosis Date Noted  . Renal hemorrhage, left 05/23/2019  . Renal cyst, native, hemorrhage 05/22/2019  . RLS (restless legs syndrome) 03/23/2019  . Insomnia due to medical condition 03/23/2019  . Gastro-esophageal reflux disease without esophagitis  06/29/2018  . Bipolar disorder, current episode mixed, mild ([place]) 06/29/2018  . Cardiac murmur, unspecified 06/29/2018  . Neurocognitive disorder 06/29/2018  . Other infective otitis externa, left ear 06/29/2018  . Vitamin B12 deficiency anemia 06/29/2018  . Suicide attempt ([place]) 03/09/2018  . Acute respiratory failure with hypoxia ([place]) 03/04/2018  . Bipolar I disorder, most recent episode depressed ([name]) 02/26/2018  . UTI (urinary tract infection) 12/16/2017  . Palliative care encounter   . Bipolar affective disorder, current episode depressed with psychotic symptoms ([place]) 09/17/2017  . Bipolar I disorder, most recent episode (or current) manic ([place]) 09/02/2017  . Acute delirium 09/02/2017  . Altered mental status   . Acute encephalopathy 09/01/2017  . ESRD on dialysis ([place]) 04/06/2017  . Complication of vascular access for dialysis 04/06/2017  . Hematuria 12/18/2016  . Polycystic kidney 10/12/2016  . Ruptured cyst of kidney 10/03/2016  . HTN (hypertension) 10/01/2016  . Anemia associated with chronic renal failure 09/24/2016  . Noninfectious diarrhea   . Benign neoplasm of cecum   . Benign neoplasm of ascending colon   . Diarrhea   . Nausea   . Gastric polyp   . Chronic constipation 12/27/2014  . Nausea with vomiting 12/27/2014  . Left sided abdominal pain 12/27/2014  . Personal history of colonic polyps 05/10/2013    Past Surgical History:  Procedure Laterality Date  . A/V FISTULAGRAM Left 04/14/2017  Procedure: A/V Fistulagram;  Surgeon: Katha Cabal, MD;  [facility];  Service: Cardiovascular;  Laterality: Left;  . A/V FISTULAGRAM Left 06/09/2017   Procedure: A/V FISTULAGRAM;  Surgeon: Katha Cabal, MD;  Location: [facility];  Service: Cardiovascular;  Laterality: Left;  . A/V FISTULAGRAM Left 11/02/2017   Procedure: A/V FISTULAGRAM;  Surgeon: Algernon Huxley, MD;  Location: [facility];  Service: Cardiovascular;   Laterality: Left;  . A/V FISTULAGRAM Left 12/22/2018   Procedure: A/V FISTULAGRAM;  Surgeon: Katha Cabal, MD;  Location: [facility];  Service: Cardiovascular;  Laterality: Left;  . A/V FISTULAGRAM Left 06/12/2020   Procedure: A/V FISTULAGRAM;  Surgeon: Katha Cabal, MD;  Location: [place] [facility];  Service: Cardiovascular;  Laterality: Left;  . A/V SHUNT INTERVENTION N/A 11/02/2017   Procedure: A/V SHUNT INTERVENTION;  Surgeon: Algernon Huxley, MD;  Location: [facility];  Service: Cardiovascular;  Laterality: N/A;  . ABDOMINAL HYSTERECTOMY  1990 ?  . AV FISTULA PLACEMENT  4/30  . BREAST EXCISIONAL BIOPSY Left 1994   neg surgical bx  . CHOLECYSTECTOMY  2003  . COLONOSCOPY  2014   Dr. Jamal Collin  . COLONOSCOPY WITH PROPOFOL N/A 09/24/2015   Procedure: COLONOSCOPY WITH random colon byopies.;  Surgeon: Lucilla Lame, MD;  Location: [facility];  Service: Endoscopy;  Laterality: N/A;  . ESOPHAGOGASTRODUODENOSCOPY (EGD) WITH PROPOFOL N/A 09/24/2015   Procedure: ESOPHAGOGASTRODUODENOSCOPY (EGD) ;  Surgeon: Lucilla Lame, MD;  Location: [facility];  Service: Endoscopy;  Laterality: N/A;  . EYE SURGERY    . PERIPHERAL VASCULAR CATHETERIZATION N/A 05/29/2015   Procedure: A/V Shuntogram/Fistulagram;  Surgeon: Katha Cabal, MD;  Location: [facility];  Service: Cardiovascular;  Laterality: N/A;  . PERIPHERAL VASCULAR CATHETERIZATION N/A 05/29/2015   Procedure: A/V Shunt Intervention;  Surgeon: Katha Cabal, MD;  Location: [facility];  Service: Cardiovascular;  Laterality: N/A;  . POLYPECTOMY  09/24/2015   Procedure: POLYPECTOMY INTESTINAL;  Surgeon: Lucilla Lame, MD;  Location: [facility];  Service: Endoscopy;;  cecal polyp ascending polyp    Prior to Admission medications   Medication Sig Start Date End Date Taking? Authorizing Provider  acetaminophen (TYLENOL) 500 MG tablet Take 1,000 mg by mouth 2 (two) times daily as needed  (pain.).    [provider]  Ascorbic Acid (VITAMIN C) 125 MG CHEW Chew 250 mg by mouth in the morning and at bedtime.    [provider]  b complex-vitamin c-folic acid (NEPHRO-VITE) 0.8 MG TABS tablet Take 1 tablet by mouth daily.    [provider]  brimonidine (ALPHAGAN) 0.15 % ophthalmic solution  02/24/20   [provider]  brimonidine (ALPHAGAN) 0.2 % ophthalmic solution Place 1 drop into both eyes 2 (two) times daily. 10/22/17   Clapacs, [name], MD  calcium acetate (PHOSLO) 667 MG capsule Take 667 mg by mouth 3 (three) times daily with meals.  01/04/19   [provider]  carbamazepine (TEGRETOL) 100 MG chewable tablet Chew 100 mg by mouth 2 (two) times daily. (0600 & 2100)    [provider]  dicyclomine (BENTYL) 20 MG tablet Take 20 mg by mouth 3 (three) times daily after meals.    [provider]  doxycycline (VIBRAMYCIN) 100 MG capsule Take 100 mg by mouth 2 (two) times daily. 05/22/20   [provider]  famotidine (PEPCID) 20 MG tablet Take 20 mg by mouth daily as  needed for heartburn or indigestion.    [provider]  fluticasone (FLONASE) 50 MCG/ACT nasal spray Place 1 spray into both nostrils daily.     [provider]  haloperidol (HALDOL) 5 MG tablet Take 2.5 mg by mouth 3 (three) times a week. (Tuesday, Thursday and Saturday)    [provider]  HYDROcodone-acetaminophen (NORCO/VICODIN) 5-325 MG tablet Take 1 tablet by mouth every 4 (four) hours as needed for moderate pain.    [provider]  ipratropium (ATROVENT) 0.03 % nasal spray Place 2 sprays into both nostrils 2 (two) times daily as needed for rhinitis (congestion).    [provider]  lidocaine-prilocaine (EMLA) cream Apply 1 application topically every Monday, Wednesday, and Friday with hemodialysis.     [provider]  loratadine (CLARITIN) 10 MG tablet Take 10 mg by mouth daily at 6 (six) AM.     [provider]  LORazepam (ATIVAN) 0.5 MG tablet Take 0.5 mg by mouth 3 (three) times daily as needed for anxiety.    [provider]  metoprolol tartrate (LOPRESSOR) 25 MG tablet Take 25 mg by mouth See admin instructions. Take 1 tablet (25 mg) by mouth twice daily on Sundays, Tuesdays, Thursdays, & Saturdays. Take 1 tablet (25 mg) by daily in the evening on dialysis days Mondays, Wednesdays, & Fridays. 02/09/20   [provider]  midodrine (PROAMATINE) 10 MG tablet Take 10 mg by mouth every Monday, Wednesday, and Friday with hemodialysis. May take an additional doses as needed for SBP<100. 01/10/20   [provider]  Netarsudil-Latanoprost (ROCKLATAN) 0.02-0.005 % SOLN Place 1 drop into both eyes at bedtime.     [provider]  omeprazole (PRILOSEC) 40 MG capsule Take 40 mg by mouth at bedtime.  02/06/20   [provider]  ondansetron (ZOFRAN) 4 MG tablet Take 4 mg by mouth every 6 (six) hours as needed for nausea or vomiting.    [provider]  rOPINIRole (REQUIP) 2 MG tablet Take 1 tablet (2 mg total) by mouth daily. Patient taking differently: Take 2 mg by mouth daily at 6 (six) AM. 03/18/18   [name], [name], MD  simethicone (MYLICON) 119 MG chewable tablet Chew 125 mg by mouth in the morning, at noon, and at bedtime.    [provider]  timolol (TIMOPTIC) 0.5 % ophthalmic solution Place 1 drop into both eyes 2 (two) times daily. 10/22/17   Clapacs, [name], MD  traZODone (DESYREL) 50 MG tablet Take 25 mg by mouth at bedtime.    [provider]  vitamin B-12 (CYANOCOBALAMIN) 1000 MCG tablet Take 1,000 mcg by mouth daily at 6 (six) AM.    [provider]  ziprasidone (GEODON) 20 MG capsule Take 20 mg by mouth at bedtime.    [provider]    Allergies Morphine and related, Gabapentin, Indomethacin, and Pollen extract  Family History  Problem Relation Age of Onset  . Stroke Father   .  Hypertension Father   . Breast cancer Other   . Colon cancer Neg Hx   . Liver disease Neg Hx   . Mental illness Neg Hx     Social History Social History   Tobacco Use  . Smoking status: Never Smoker  . Smokeless tobacco: Never Used  Vaping Use  . Vaping Use: Never used  Substance Use Topics  . Alcohol use: No    Alcohol/week: 0.0 standard drinks  . Drug use: No    Review of Systems  Constitutional: No fever/chills Eyes: No visual changes. ENT: No sore throat. Cardiovascular: Denies chest pain.  Positive for syncope. Respiratory: Denies shortness of breath. Gastrointestinal: No abdominal pain.  Positive for nausea, vomiting, and diarrhea.  No constipation. Genitourinary: Negative for dysuria. Musculoskeletal: Negative for back pain. Skin: Negative for rash. Neurological: Negative for headaches, focal weakness or numbness.  ____________________________________________   PHYSICAL EXAM:  VITAL SIGNS: ED Triage Vitals  Enc Vitals Group     BP 07/16/20 1156 (!) 162/42     Pulse Rate 07/16/20 1156 (!) 53     Resp 07/16/20 1156 16     Temp 07/16/20 1156 (!) 97.3 F (36.3 C)     Temp Source 07/16/20 1156 Oral     SpO2 07/16/20 1156 100 %     Weight 07/16/20 1155 139 lb 15.9 oz (63.5 kg)     Height 07/16/20 1155 5\' 3"  (1.6 m)     Head Circumference --      Peak Flow --      Pain Score 07/16/20 1155 0     Pain Loc --      Pain Edu? --      Excl. in [place]? --     Constitutional: Alert and oriented. Eyes: Conjunctivae are normal. Head: Atraumatic. Nose: No congestion/rhinnorhea. Mouth/Throat: Mucous membranes are moist. Neck: Normal ROM Cardiovascular: Normal rate, regular rhythm. Grossly normal heart sounds.  Left upper extremity AV fistula with palpable thrill. Respiratory: Normal respiratory effort.  No retractions. Lungs CTAB. Gastrointestinal: Soft and nontender. No distention. Genitourinary: deferred Musculoskeletal: No lower extremity tenderness nor  edema. Neurologic:  Normal speech and language. No gross focal neurologic deficits are appreciated. Skin:  Skin is warm, dry and intact. No rash noted. Psychiatric: Mood and affect are normal. Speech and behavior are normal.  ____________________________________________   LABS (all labs ordered are listed, but only abnormal results are displayed)  Labs Reviewed  BASIC METABOLIC PANEL - Abnormal; Notable for the following components:      Result Value   Glucose, Bld 109 (*)    Creatinine, Ser 3.34 (*)    GFR, Estimated 14 (*)    All other components within normal limits  CBC - Abnormal; Notable for the following components:   RBC 3.27 (*)    Hemoglobin 10.7 (*)    HCT 32.2 (*)    All other components within normal limits  URINALYSIS, COMPLETE (UACMP) WITH MICROSCOPIC - Abnormal; Notable for the following components:   Color, Urine YELLOW (*)    APPearance CLEAR (*)    Leukocytes,Ua TRACE (*)    All other components within normal limits  HEPATIC FUNCTION PANEL - Abnormal; Notable for the following components:   AST 13 (*)    All other components within normal limits  RESP PANEL BY RT-PCR (FLU A&B, COVID) ARPGX2  LIPASE, BLOOD  CBG MONITORING, ED  TROPONIN I (HIGH SENSITIVITY)   ____________________________________________  EKG  ED ECG REPORT I, Blake Divine, the attending physician, personally viewed and interpreted this ECG.   Date: 07/16/2020  EKG Time: 11:55  Rate: 53  Rhythm: sinus bradycardia  Axis: LAD  Intervals:none  ST&T Change: None   PROCEDURES  Procedure(s) performed (including Critical Care):  Procedures   ____________________________________________   INITIAL IMPRESSION / ASSESSMENT AND PLAN / ED COURSE       74 year old female with past medical history of hypertension, ESRD on HD (MWF), GERD, and bipolar disorder who presents to the ED following lightheadedness and syncopal episode while at dialysis.  EKG shows no evidence of arrhythmia  or ischemia, patient's lightheadedness has improved and she has not had any chest pain or shortness of breath with this episode.  She does states she has been dealing with some vomiting and diarrhea for greater than 2 weeks, denies respiratory symptoms.  Labs thus far are unremarkable, we will add on LFTs and lipase but she has no focal abdominal tenderness.  We will check troponin and testing for COVID-19.  Patient does have some back pain following her syncopal episode, which we will further assess with x-ray.  If these are unremarkable, patient be appropriate for discharge home with cardiology follow-up.  Lumbar spine x-rays are reviewed by me with no obvious fracture, negative for acute process per radiology.  Additional labs are unremarkable, including troponin.  Patient has had no further lightheadedness or change in blood pressure since arrival to the ED.  She is appropriate for discharge home with PCP follow-up, was also counseled to follow-up with GI for her reflux and to return to the ED for new worsening symptoms.  Patient agrees with plan.      ____________________________________________   FINAL CLINICAL IMPRESSION(S) / ED DIAGNOSES  Final diagnoses:  Syncope, unspecified syncope type  Gastroesophageal reflux disease with esophagitis without hemorrhage  Acute midline low back pain without sciatica     ED Discharge Orders    None       Note:  This document was prepared using Dragon voice recognition software and may include unintentional dictation errors.   Blake Divine, MD 07/16/20 1743

## 2020-07-19 ENCOUNTER — Encounter: Payer: Self-pay | Admitting: Gastroenterology

## 2020-07-19 ENCOUNTER — Ambulatory Visit (INDEPENDENT_AMBULATORY_CARE_PROVIDER_SITE_OTHER): Payer: Medicare HMO | Admitting: Gastroenterology

## 2020-07-19 VITALS — BP 199/63 | HR 73 | Ht 63.0 in | Wt 141.6 lb

## 2020-07-19 DIAGNOSIS — R112 Nausea with vomiting, unspecified: Secondary | ICD-10-CM | POA: Diagnosis not present

## 2020-07-19 DIAGNOSIS — K219 Gastro-esophageal reflux disease without esophagitis: Secondary | ICD-10-CM

## 2020-07-19 DIAGNOSIS — R634 Abnormal weight loss: Secondary | ICD-10-CM

## 2020-07-19 NOTE — Progress Notes (Signed)
Primary Care Physician: [facility]  Primary Gastroenterologist:  Dr. Lucilla Lame  Chief Complaint  Patient presents with  . Nausea  . Vomiting    HPI: Joyce [name] is a 74 y.o. female here who comes to see me after seeing me in the past for nausea and diarrhea.  The last time I saw the patient she had reported that the diarrhea was improved.  The patient was in the emergency room 3 days ago with weakness and heartburn.  The patient was reporting that she continued to have reflux symptoms despite being on a PPI. She reports that she has lost 6 lbs in the last month. She has been taken off multiple medications that may cause nausea but still has nausea and vomiting. The patient reports that her gallbladder is still in.  She states that fatty foods and greasy foods can make it worse.  She denies any right upper quadrant pain.  She does report that she was able to keep some soup down yesterday.  Past Medical History:  Diagnosis Date  . Anal fissure   . Bipolar affective disorder ([place])   . CKD (chronic kidney disease)    Dr Holley Raring [name] 4  . Colon polyps   . Diverticulitis   . Diverticulitis   . Family history of adverse reaction to anesthesia    mom - PONV  . GERD (gastroesophageal reflux disease)   . Headache    migraines - none over 10 yrs  . Heart murmur   . History of hiatal hernia   . Hypertension   . Pancreatitis    Valproic acid  . Vertigo     Current Outpatient Medications  Medication Sig Dispense Refill  . acetaminophen (TYLENOL) 500 MG tablet Take 1,000 mg by mouth 2 (two) times daily as needed (pain.).    Marland Kitchen Ascorbic Acid (VITAMIN C) 125 MG CHEW Chew 250 mg by mouth in the morning and at bedtime.    Marland Kitchen b complex-vitamin c-folic acid (NEPHRO-VITE) 0.8 MG TABS tablet Take 1 tablet by mouth daily.    . brimonidine (ALPHAGAN) 0.15 % ophthalmic solution     . brimonidine (ALPHAGAN) 0.2 % ophthalmic solution Place 1 drop into both eyes 2 (two)  times daily. 5 mL 1  . calcium acetate (PHOSLO) 667 MG capsule Take 667 mg by mouth 3 (three) times daily with meals.     . carbamazepine (TEGRETOL) 100 MG chewable tablet Chew 100 mg by mouth 2 (two) times daily. (0600 & 2100)    . dicyclomine (BENTYL) 20 MG tablet Take 20 mg by mouth 3 (three) times daily after meals.    . famotidine (PEPCID) 20 MG tablet Take 20 mg by mouth daily as needed for heartburn or indigestion.    . fluticasone (FLONASE) 50 MCG/ACT nasal spray Place 1 spray into both nostrils daily.     . haloperidol (HALDOL) 5 MG tablet Take 2.5 mg by mouth 3 (three) times a week. (Tuesday, Thursday and Saturday)    . HYDROcodone-acetaminophen (NORCO/VICODIN) 5-325 MG tablet Take 1 tablet by mouth every 4 (four) hours as needed for moderate pain.    Marland Kitchen ipratropium (ATROVENT) 0.03 % nasal spray Place 2 sprays into both nostrils 2 (two) times daily as needed for rhinitis (congestion).    Marland Kitchen lidocaine-prilocaine (EMLA) cream Apply 1 application topically every Monday, Wednesday, and Friday with hemodialysis.   3  . loratadine (CLARITIN) 10 MG tablet Take 10 mg by mouth daily at 6 (six) AM.    .  LORazepam (ATIVAN) 0.5 MG tablet Take 0.5 mg by mouth 3 (three) times daily as needed for anxiety.    . metoprolol tartrate (LOPRESSOR) 25 MG tablet Take 25 mg by mouth See admin instructions. Take 1 tablet (25 mg) by mouth twice daily on Sundays, Tuesdays, Thursdays, & Saturdays. Take 1 tablet (25 mg) by daily in the evening on dialysis days Mondays, Wednesdays, & Fridays.    . midodrine (PROAMATINE) 10 MG tablet Take 10 mg by mouth every Monday, Wednesday, and Friday with hemodialysis. May take an additional doses as needed for SBP<100.    Marland Kitchen Netarsudil-Latanoprost (ROCKLATAN) 0.02-0.005 % SOLN Place 1 drop into both eyes at bedtime.     Marland Kitchen omeprazole (PRILOSEC) 40 MG capsule Take 40 mg by mouth at bedtime.     . ondansetron (ZOFRAN) 4 MG tablet Take 4 mg by mouth every 6 (six) hours as needed for  nausea or vomiting.    Marland Kitchen rOPINIRole (REQUIP) 2 MG tablet Take 1 tablet (2 mg total) by mouth daily. (Patient taking differently: Take 2 mg by mouth daily at 6 (six) AM.) 30 tablet 3  . simethicone (MYLICON) 751 MG chewable tablet Chew 125 mg by mouth in the morning, at noon, and at bedtime.    . timolol (TIMOPTIC) 0.5 % ophthalmic solution Place 1 drop into both eyes 2 (two) times daily. 10 mL 1  . traZODone (DESYREL) 50 MG tablet Take 25 mg by mouth at bedtime.    . vitamin B-12 (CYANOCOBALAMIN) 1000 MCG tablet Take 1,000 mcg by mouth daily at 6 (six) AM.    . ziprasidone (GEODON) 20 MG capsule Take 20 mg by mouth at bedtime.     No current facility-administered medications for this visit.    Allergies as of 07/19/2020 - Review Complete 07/19/2020  Allergen Reaction Noted  . Morphine and related Shortness Of Breath 10/09/2016  . Gabapentin  01/26/2019  . Indomethacin Hives 05/10/2013  . Pollen extract Other (See Comments) 12/25/2014    ROS:  General: Negative for anorexia, weight loss, fever, chills, fatigue, weakness. ENT: Negative for hoarseness, difficulty swallowing , nasal congestion. CV: Negative for chest pain, angina, palpitations, dyspnea on exertion, peripheral edema.  Respiratory: Negative for dyspnea at rest, dyspnea on exertion, cough, sputum, wheezing.  GI: See history of present illness. GU:  Negative for dysuria, hematuria, urinary incontinence, urinary frequency, nocturnal urination.  Endo: Negative for unusual weight change.    Physical Examination:   BP (!) 199/63   Pulse 73   Ht 5\' 3"  (1.6 m)   Wt 141 lb 9.6 oz (64.2 kg)   BMI 25.08 kg/m   General: Well-nourished, well-developed in no acute distress.  Eyes: No icterus. Conjunctivae pink. Lungs: Clear to auscultation bilaterally. Non-labored. Heart: Regular rate and rhythm, no murmurs rubs or gallops.  Abdomen: Bowel sounds are normal, nontender, nondistended, no hepatosplenomegaly or masses, no abdominal  bruits or hernia , no rebound or guarding.   Extremities: No lower extremity edema. No clubbing or deformities. Neuro: Alert and oriented x 3.  Grossly intact. Skin: Warm and dry, no jaundice.   Psych: Alert and cooperative, normal mood and affect.  Labs:    Imaging Studies: DG Lumbar Spine 2-3 Views  Result Date: 07/16/2020 CLINICAL DATA:  Mid back pain after fall EXAM: LUMBAR SPINE - 2-3 VIEW COMPARISON:  CT 05/22/2019 FINDINGS: Lumbar alignment within normal limits. Vertebral body heights are maintained. Mild degenerative changes at L1-L2. Metallic key over the abdomen on lateral view. IMPRESSION: Negative for  acute osseous abnormality. Electronically Signed   By: Donavan Foil M.D.   On: 07/16/2020 17:28   VAS US DUPLEX DIALYSIS ACCESS (AVF,AVG)  Result Date: 06/28/2020 DIALYSIS ACCESS Reason for Exam: Routine follow up. Access Site: Left Upper Extremity. Access Type: Brachial-cephalic AVF. History: 12/22/2018 - PTA and new CVS stent added          11/14/14: Left brachial-cephalic AVF creation;          01/0/27: Mid cephalic vein PTA;          04/14/17: Shoulder level cephalic vein PTA/stent;          25/36/64: Cephalic vein PTA at confluence;          11/02/17: Left upper arm cephalic vein & confluence stent;. Comparison Study: 02/13/2020 Performing Technologist: Charlane Ferretti RT (R)(VS)  Examination Guidelines: A complete evaluation includes B-mode imaging, spectral Doppler, color Doppler, and power Doppler as needed of all accessible portions of each vessel. Unilateral testing is considered an integral part of a complete examination. Limited examinations for reoccurring indications may be performed as noted.  Findings: +--------------------+----------+-----------------+--------+ AVF                 PSV (cm/s)Flow Vol (mL/min)Comments +--------------------+----------+-----------------+--------+ Native artery inflow   334          3610                 +--------------------+----------+-----------------+--------+ AVF Anastomosis        671                              +--------------------+----------+-----------------+--------+  +---------------+----------+-------------+----------+--------+ OUTFLOW VEIN   PSV (cm/s)Diameter (cm)Depth (cm)Describe +---------------+----------+-------------+----------+--------+ Subclavian vein   301                                    +---------------+----------+-------------+----------+--------+ Confluence        185                            stent   +---------------+----------+-------------+----------+--------+ Shoulder          303                            stent   +---------------+----------+-------------+----------+--------+ Prox UA           276                            stent   +---------------+----------+-------------+----------+--------+ Mid UA            316                            stent   +---------------+----------+-------------+----------+--------+ Dist UA           423                                    +---------------+----------+-------------+----------+--------+ AC Fossa          403                                    +---------------+----------+-------------+----------+--------+  +---------------+-------------+---------+---------+----------+-----------------+  Diameter (cm)  Depth  BranchingPSV (cm/s)   Flow Volume                                  (cm)                          (ml/min)      +---------------+-------------+---------+---------+----------+-----------------+ Left Radial                                       75                      Artery                                                                    +---------------+-------------+---------+---------+----------+-----------------+ Summary: Patent arteriovenous fistula.  Patent left Brachial/Cephalic AVF stent with no evidence of stenosis.  *See table(s)  above for measurements and observations.  Diagnosing physician: [name] MD Electronically signed by [name] MD on 06/28/2020 at 5:47:28 PM.    --------------------------------------------------------------------------------   Final     Assessment and Plan:   Delories Mauri is a 74 y.o. y/o female who comes in today with intractable nausea and vomiting. She reports that she has lost 6 pounds in the last month.  The patient has had multiple medication stopped that she states were thought to cause her symptoms but she still does not feel any better.  The patient will be set up for a right upper quadrant ultrasound and a gallbladder emptying study.  The patient states that she still has acid burning her throat despite being on her PPI.  The patient will be switched to [place] in the morning and Pepcid at night.  She will also be set up for an upper endoscopy.  The patient has been explained the plan and agrees with it.       Lucilla Lame, MD. Marval Regal    Note: This dictation was prepared with Dragon dictation along with smaller phrase technology. Any transcriptional errors that result from this process are unintentional.

## 2020-08-16 ENCOUNTER — Other Ambulatory Visit: Payer: Self-pay

## 2020-08-16 ENCOUNTER — Encounter: Payer: Self-pay | Admitting: Cardiology

## 2020-08-16 ENCOUNTER — Encounter (INDEPENDENT_AMBULATORY_CARE_PROVIDER_SITE_OTHER): Payer: Medicare HMO

## 2020-08-16 ENCOUNTER — Ambulatory Visit (INDEPENDENT_AMBULATORY_CARE_PROVIDER_SITE_OTHER): Payer: Medicare HMO | Admitting: Cardiology

## 2020-08-16 ENCOUNTER — Ambulatory Visit (INDEPENDENT_AMBULATORY_CARE_PROVIDER_SITE_OTHER): Payer: Medicare HMO | Admitting: Vascular Surgery

## 2020-08-16 VITALS — BP 170/60 | HR 53 | Ht 63.0 in | Wt 139.0 lb

## 2020-08-16 DIAGNOSIS — I953 Hypotension of hemodialysis: Secondary | ICD-10-CM

## 2020-08-16 DIAGNOSIS — K2100 Gastro-esophageal reflux disease with esophagitis, without bleeding: Secondary | ICD-10-CM | POA: Diagnosis not present

## 2020-08-16 NOTE — Progress Notes (Signed)
Cardiology Office Note:    Date:  08/16/2020   ID:  Joyce [name] Joyce [name], MRN [id number]  PCP:  [facility]  Cardiologist:  Kate Sable, MD  Electrophysiologist:  None   Referring MD: [facility]   Chief Complaint  Patient presents with  . Other    F/u hosp. Syncope c/o low BP. Meds reviewed verbally with pt.    History of Present Illness:    Joyce [name] is a 75 y.o. female with a hx of GERD, hypertension, end-stage renal disease on dialysis MWF, who presents for follow-up.    She was previously seen due to bradycardia, dizziness, syncope.  She also had orthostasis.  Midodrine was started and titrated to help with symptoms while monitoring for hypertension.  She was seen in the ED on 07/16/2020 due to a syncopal event with blood pressures dropping while undergoing dialysis treatment.  Work-up with EKG showed no ischemia or arrhythmia.  Denies chest pain or shortness of breath.  Her symptoms improved in the ED without any intervention and patient was eventually discharged.  Prior notes Echocardiogram 05/2019 showed normal systolic function, EF 60 to 65%.  Past Medical History:  Diagnosis Date  . Anal fissure   . Bipolar affective disorder ([place])   . CKD (chronic kidney disease)    Dr Holley Raring [name] 4  . Colon polyps   . Diverticulitis   . Diverticulitis   . Family history of adverse reaction to anesthesia    mom - PONV  . GERD (gastroesophageal reflux disease)   . Headache    migraines - none over 10 yrs  . Heart murmur   . History of hiatal hernia   . Hypertension   . Pancreatitis    Valproic acid  . Vertigo     Past Surgical History:  Procedure Laterality Date  . A/V FISTULAGRAM Left 04/14/2017   Procedure: A/V Fistulagram;  Surgeon: Katha Cabal, MD;  Location: [facility];  Service: Cardiovascular;  Laterality: Left;  . A/V FISTULAGRAM Left 06/09/2017   Procedure: A/V FISTULAGRAM;  Surgeon:  Katha Cabal, MD;  Location: [facility];  Service: Cardiovascular;  Laterality: Left;  . A/V FISTULAGRAM Left 11/02/2017   Procedure: A/V FISTULAGRAM;  Surgeon: Algernon Huxley, MD;  Location: [facility];  Service: Cardiovascular;  Laterality: Left;  . A/V FISTULAGRAM Left 12/22/2018   Procedure: A/V FISTULAGRAM;  Surgeon: Katha Cabal, MD;  Location: [facility];  Service: Cardiovascular;  Laterality: Left;  . A/V FISTULAGRAM Left 06/12/2020   Procedure: A/V FISTULAGRAM;  Surgeon: Katha Cabal, MD;  Location: [facility];  Service: Cardiovascular;  Laterality: Left;  . A/V SHUNT INTERVENTION N/A 11/02/2017   Procedure: A/V SHUNT INTERVENTION;  Surgeon: Algernon Huxley, MD;  Location: [facility];  Service: Cardiovascular;  Laterality: N/A;  . ABDOMINAL HYSTERECTOMY  1990 ?  . AV FISTULA PLACEMENT  4/30  . BREAST EXCISIONAL BIOPSY Left 1994   neg surgical bx  . CHOLECYSTECTOMY  2003  . COLONOSCOPY  2014   Dr. Jamal Collin  . COLONOSCOPY WITH PROPOFOL N/A 09/24/2015   Procedure: COLONOSCOPY WITH random colon byopies.;  Surgeon: Lucilla Lame, MD;  Location: [facility];  Service: Endoscopy;  Laterality: N/A;  . ESOPHAGOGASTRODUODENOSCOPY (EGD) WITH PROPOFOL N/A 09/24/2015   Procedure: ESOPHAGOGASTRODUODENOSCOPY (EGD) ;  Surgeon: Lucilla Lame, MD;  Location: [facility];  Service: Endoscopy;  Laterality: N/A;  . EYE SURGERY    .  PERIPHERAL VASCULAR CATHETERIZATION N/A 05/29/2015   Procedure: A/V Shuntogram/Fistulagram;  Surgeon: Katha Cabal, MD;  Location: [facility];  Service: Cardiovascular;  Laterality: N/A;  . PERIPHERAL VASCULAR CATHETERIZATION N/A 05/29/2015   Procedure: A/V Shunt Intervention;  Surgeon: Katha Cabal, MD;  Location: [facility];  Service: Cardiovascular;  Laterality: N/A;  . POLYPECTOMY  09/24/2015   Procedure: POLYPECTOMY INTESTINAL;  Surgeon: Lucilla Lame, MD;  Location: [facility];  Service: Endoscopy;;  cecal polyp ascending polyp    Current Medications: Current Meds  Medication Sig  . acetaminophen (TYLENOL) 500 MG tablet Take 1,000 mg by mouth 2 (two) times daily as needed (pain.).  Marland Kitchen alum & mag hydroxide-simeth (GELUSIL) [phone] MG chewable tablet Chew by mouth every 6 (six) hours as needed for indigestion or heartburn.  . Ascorbic Acid (VITAMIN C) 125 MG CHEW Chew 250 mg by mouth in the morning and at bedtime.  Marland Kitchen b complex-vitamin c-folic acid (NEPHRO-VITE) 0.8 MG TABS tablet Take 1 tablet by mouth daily.  . brimonidine (ALPHAGAN) 0.15 % ophthalmic solution   . brimonidine (ALPHAGAN) 0.2 % ophthalmic solution Place 1 drop into both eyes 2 (two) times daily.  . calcium acetate (PHOSLO) 667 MG capsule Take 667 mg by mouth 3 (three) times daily with meals.   . carbamazepine (TEGRETOL) 100 MG chewable tablet Chew 100 mg by mouth 2 (two) times daily. (0600 & 2100)  . Dexlansoprazole (DEXILANT) 30 MG capsule Take 30 mg by mouth daily.  Marland Kitchen dicyclomine (BENTYL) 20 MG tablet Take 20 mg by mouth 3 (three) times daily after meals.  . famotidine (PEPCID) 20 MG tablet Take 20 mg by mouth daily as needed for heartburn or indigestion.  . fluticasone (FLONASE) 50 MCG/ACT nasal spray Place 1 spray into both nostrils daily.   . haloperidol (HALDOL) 5 MG tablet Take 2.5 mg by mouth 3 (three) times a week. (Tuesday, Thursday and Saturday)  . HYDROcodone-acetaminophen (NORCO/VICODIN) 5-325 MG tablet Take 1 tablet by mouth every 4 (four) hours as needed for moderate pain.  Marland Kitchen ipratropium (ATROVENT) 0.03 % nasal spray Place 2 sprays into both nostrils 2 (two) times daily as needed for rhinitis (congestion).  Marland Kitchen lidocaine-prilocaine (EMLA) cream Apply 1 application topically every Monday, Wednesday, and Friday with hemodialysis.   Marland Kitchen loratadine (CLARITIN) 10 MG tablet Take 10 mg by mouth daily at 6 (six) AM.  . LORazepam (ATIVAN) 0.5 MG tablet Take 0.5 mg by mouth 3 (three) times  daily as needed for anxiety.  . midodrine (PROAMATINE) 10 MG tablet Take 10 mg by mouth every Monday, Wednesday, and Friday with hemodialysis. May take an additional doses as needed for SBP<100.  Marland Kitchen Netarsudil-Latanoprost (ROCKLATAN) 0.02-0.005 % SOLN Place 1 drop into both eyes at bedtime.   Marland Kitchen omeprazole (PRILOSEC) 40 MG capsule Take 40 mg by mouth at bedtime.   . ondansetron (ZOFRAN) 4 MG tablet Take 4 mg by mouth every 6 (six) hours as needed for nausea or vomiting.  Marland Kitchen rOPINIRole (REQUIP) 2 MG tablet Take 1 tablet (2 mg total) by mouth daily. (Patient taking differently: Take 2 mg by mouth daily at 6 (six) AM.)  . simethicone (MYLICON) 706 MG chewable tablet Chew 125 mg by mouth in the morning, at noon, and at bedtime.  . timolol (TIMOPTIC) 0.5 % ophthalmic solution Place 1 drop into both eyes 2 (two) times daily.  . traZODone (DESYREL) 50 MG tablet Take 25 mg by mouth at bedtime.  . vitamin B-12 (CYANOCOBALAMIN) 1000  MCG tablet Take 1,000 mcg by mouth daily at 6 (six) AM.  . ziprasidone (GEODON) 20 MG capsule Take 20 mg by mouth at bedtime.     Allergies:   Morphine and related, Gabapentin, Indomethacin, and Pollen extract   Social History   Socioeconomic History  . Marital status: Divorced    Spouse name: Not on file  . Number of children: 1  . Years of education: Not on file  . Highest education level: Not on file  Occupational History  . 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 Smoking status: Never Smoker  . Smokeless tobacco: Never Used  Vaping Use  . Vaping Use: Never used  Substance and Sexual Activity  . Alcohol use: No    Alcohol/week: 0.0 standard drinks  . Drug use: No  . Sexual activity: Not Currently  Other Topics Concern  . 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   Social Determinants of Health   Financial Resource Strain: Not on  file  Food Insecurity: Not on file  Transportation Needs: Not on file  Physical Activity: Not on file  Stress: Not on file  Social Connections: Not on file     Family History: The patient's family history includes Breast cancer in an other family member; Hypertension in her father; Stroke in her father. There is no history of Colon cancer, Liver disease, or Mental illness.  ROS:   Please see the history of present illness.     All other systems reviewed and are negative.  EKGs/Labs/Other Studies Reviewed:    The following studies were reviewed today:   EKG:  EKG is  ordered today.  The ekg ordered today demonstrates sinus bradycardia, heart rate 59.  Recent Labs: 07/16/2020: ALT 9; BUN 13; Creatinine, Ser 3.34; Hemoglobin 10.7; Platelets 191; Potassium 4.0; Sodium 139  Recent Lipid Panel    Component Value Date/Time   CHOL 251 (H) 09/18/2017 0646   CHOL 105 05/14/2014 0412   TRIG 124 09/18/2017 0646   TRIG 127 05/14/2014 0412   HDL 66 09/18/2017 0646   HDL 37 (L) 05/14/2014 0412   CHOLHDL 3.8 09/18/2017 0646   VLDL 25 09/18/2017 0646   VLDL 25 05/14/2014 0412   LDLCALC 160 (H) 09/18/2017 0646   LDLCALC 43 05/14/2014 0412    Physical Exam:    VS:  BP (!) 170/60 (BP Location: Left Arm, Patient Position: Sitting, Cuff Size: Normal)   Pulse (!) 53   Ht 5\' 3"  (1.6 m)   Wt 139 lb (63 kg)   SpO2 99%   BMI 24.62 kg/m     Wt Readings from Last 3 Encounters:  08/16/20 139 lb (63 kg)  07/19/20 141 lb 9.6 oz (64.2 kg)  07/16/20 139 lb 15.9 oz (63.5 kg)     GEN:  Well nourished, well developed in no acute distress HEENT: Normal NECK: No JVD; No carotid bruits LYMPHATICS: No lymphadenopathy CARDIAC: RRR, systolic murmur noted on exam, consistent with AV fistula present RESPIRATORY:  Clear to auscultation without rales, wheezing or rhonchi  ABDOMEN: Soft, non-tender, non-distended MUSCULOSKELETAL:  No edema; left arm AV fistula noted SKIN: Warm and dry NEUROLOGIC:   Alert and oriented x 3 PSYCHIATRIC:  Normal affect   ASSESSMENT:    1. Hemodialysis-associated hypotension   2. Gastroesophageal reflux disease with esophagitis without hemorrhage  PLAN:      1. Hemodialysis induced hypotension.  Recommend midodrine 10 mg 3 times daily on days of dialysis, Monday Wednesday Friday, about 15 to 30 minutes prior to dialysis session.  Will defer additional management of dialysis induced hypotension to nephrology.  Recommend holding midodrine on off dialysis days to prevent systemic hypertension.  Orthostatic vitals today reveal orthostasis. 2. Hypertension, BP elevated, likely from midodrine.  Adjust midodrine as in #1. 3. History of GERD, symptoms of reflux managed with Pepcid.  Recommend PCP work-up or GI if needed.  Follow-up 3 months  This note was generated in part or whole with voice recognition software. Voice recognition is usually quite accurate but there are transcription errors that can and very often do occur. I apologize for any typographical errors that were not detected and corrected.  Medication Adjustments/Labs and Tests Ordered: Current medicines are reviewed at length with the patient today.  Concerns regarding medicines are outlined above.  Orders Placed This Encounter  Procedures  . EKG 12-Lead   No orders of the defined types were placed in this encounter.   Patient Instructions  Medication Instructions:   Your physician has recommended you make the following change in your medication:   1.  Take your midodrine (PROAMATINE) 10 MG ONLY on your Dialysis days M/W/F.      Take 1st dose 15-30 mins prior to start of Dialysis.  *If you need a refill on your cardiac medications before your next appointment, please call your pharmacy*   Lab Work: None Ordered If you have labs (blood work) drawn today and your tests are completely normal, you will receive your results only by: Marland Kitchen MyChart Message (if you have MyChart) OR . A paper  copy in the mail If you have any lab test that is abnormal or we need to change your treatment, we will call you to review the results.   Testing/Procedures: None Ordered   Follow-Up: At [facility], you and your health needs are our priority.  As part of our continuing mission to provide you with exceptional heart care, we have created designated Provider Care Teams.  These Care Teams include your primary Cardiologist (physician) and Advanced Practice Providers (APPs -  Physician Assistants and Nurse Practitioners) who all work together to provide you with the care you need, when you need it.  We recommend signing up for the patient portal called "MyChart".  Sign up information is provided on this After Visit Summary.  MyChart is used to connect with patients for Virtual Visits (Telemedicine).  Patients are able to view lab/test results, encounter notes, upcoming appointments, etc.  Non-urgent messages can be sent to your provider as well.   To learn more about what you can do with MyChart, go to [email or web address].    Your next appointment:   3 month(s)  The format for your next appointment:   In Person  Provider:   Kate Sable, MD   Other Instructions      Signed, Kate Sable, MD  08/16/2020 12:30 PM    [place]

## 2020-08-16 NOTE — Patient Instructions (Signed)
Medication Instructions:   Your physician has recommended you make the following change in your medication:   1.  Take your midodrine (PROAMATINE) 10 MG ONLY on your Dialysis days M/W/F.      Take 1st dose 15-30 mins prior to start of Dialysis.  *If you need a refill on your cardiac medications before your next appointment, please call your pharmacy*   Lab Work: None Ordered If you have labs (blood work) drawn today and your tests are completely normal, you will receive your results only by: Marland Kitchen MyChart Message (if you have MyChart) OR . A paper copy in the mail If you have any lab test that is abnormal or we need to change your treatment, we will call you to review the results.   Testing/Procedures: None Ordered   Follow-Up: At [facility], you and your health needs are our priority.  As part of our continuing mission to provide you with exceptional heart care, we have created designated Provider Care Teams.  These Care Teams include your primary Cardiologist (physician) and Advanced Practice Providers (APPs -  Physician Assistants and Nurse Practitioners) who all work together to provide you with the care you need, when you need it.  We recommend signing up for the patient portal called "MyChart".  Sign up information is provided on this After Visit Summary.  MyChart is used to connect with patients for Virtual Visits (Telemedicine).  Patients are able to view lab/test results, encounter notes, upcoming appointments, etc.  Non-urgent messages can be sent to your provider as well.   To learn more about what you can do with MyChart, go to [email or web address].    Your next appointment:   3 month(s)  The format for your next appointment:   In Person  Provider:   Kate Sable, MD   Other Instructions

## 2020-08-24 ENCOUNTER — Other Ambulatory Visit: Payer: Self-pay

## 2020-08-24 MED ORDER — MIDODRINE HCL 10 MG PO TABS: 10.0000 mg | ORAL_TABLET | ORAL | 0 refills | Status: DC

## 2020-10-23 ENCOUNTER — Encounter: Payer: Self-pay | Admitting: Physician Assistant

## 2020-10-23 ENCOUNTER — Ambulatory Visit (INDEPENDENT_AMBULATORY_CARE_PROVIDER_SITE_OTHER): Payer: Medicare HMO

## 2020-10-23 ENCOUNTER — Ambulatory Visit (INDEPENDENT_AMBULATORY_CARE_PROVIDER_SITE_OTHER): Payer: Medicare HMO | Admitting: Physician Assistant

## 2020-10-23 ENCOUNTER — Other Ambulatory Visit: Payer: Self-pay

## 2020-10-23 ENCOUNTER — Telehealth: Payer: Self-pay | Admitting: Gastroenterology

## 2020-10-23 VITALS — BP 117/65 | HR 51 | Ht 63.0 in | Wt 140.0 lb

## 2020-10-23 DIAGNOSIS — R55 Syncope and collapse: Secondary | ICD-10-CM

## 2020-10-23 DIAGNOSIS — I10 Essential (primary) hypertension: Secondary | ICD-10-CM

## 2020-10-23 DIAGNOSIS — I951 Orthostatic hypotension: Secondary | ICD-10-CM | POA: Diagnosis not present

## 2020-10-23 DIAGNOSIS — Z8679 Personal history of other diseases of the circulatory system: Secondary | ICD-10-CM

## 2020-10-23 DIAGNOSIS — I953 Hypotension of hemodialysis: Secondary | ICD-10-CM | POA: Diagnosis not present

## 2020-10-23 NOTE — Telephone Encounter (Signed)
Please call to schedule procedures with Doloris Hall [phone]

## 2020-10-23 NOTE — Progress Notes (Signed)
Office Visit    Patient Name: Neosha Switalski Date of Encounter: 10/23/2020  PCP:  Orvis Brill, [place]  Cardiologist:  Kate Sable, MD  Advanced Practice Provider:  No care team member to display Electrophysiologist:  None   Chief Complaint    Chief Complaint  Patient presents with  . office visit-Patient c/o lightheadedness    75 year old female with history of hypertension, bradycardia, GERD, ESRD on hemodialysis MWF, and who presents today for 14-month follow-up.  Past Medical History    Past Medical History:  Diagnosis Date  . Anal fissure   . Bipolar affective disorder ([place])   . CKD (chronic kidney disease)    Dr Holley Raring [name] 4  . Colon polyps   . Diverticulitis   . Diverticulitis   . Family history of adverse reaction to anesthesia    mom - PONV  . GERD (gastroesophageal reflux disease)   . Headache    migraines - none over 10 yrs  . Heart murmur   . History of hiatal hernia   . Hypertension   . Pancreatitis    Valproic acid  . Vertigo    Past Surgical History:  Procedure Laterality Date  . A/V FISTULAGRAM Left 04/14/2017   Procedure: A/V Fistulagram;  Surgeon: Katha Cabal, MD;  Location: [place] [facility];  Service: Cardiovascular;  Laterality: Left;  . A/V FISTULAGRAM Left 06/09/2017   Procedure: A/V FISTULAGRAM;  Surgeon: Katha Cabal, MD;  Location: [place] [facility];  Service: Cardiovascular;  Laterality: Left;  . A/V FISTULAGRAM Left 11/02/2017   Procedure: A/V FISTULAGRAM;  Surgeon: Algernon Huxley, MD;  Location: [place] [facility];  Service: Cardiovascular;  Laterality: Left;  . A/V FISTULAGRAM Left 12/22/2018   Procedure: A/V FISTULAGRAM;  Surgeon: Katha Cabal, MD;  Location: [place] [facility];  Service: Cardiovascular;  Laterality: Left;  . A/V FISTULAGRAM Left 06/12/2020   Procedure: A/V FISTULAGRAM;  Surgeon: Katha Cabal, MD;  Location: [place] [facility];   Service: Cardiovascular;  Laterality: Left;  . A/V SHUNT INTERVENTION N/A 11/02/2017   Procedure: A/V SHUNT INTERVENTION;  Surgeon: Algernon Huxley, MD;  Location: [place] [facility];  Service: Cardiovascular;  Laterality: N/A;  . ABDOMINAL HYSTERECTOMY  1990 ?  . AV FISTULA PLACEMENT  4/30  . BREAST EXCISIONAL BIOPSY Left 1994   neg surgical bx  . CHOLECYSTECTOMY  2003  . COLONOSCOPY  2014   Dr. Jamal Collin  . COLONOSCOPY WITH PROPOFOL N/A 09/24/2015   Procedure: COLONOSCOPY WITH random colon byopies.;  Surgeon: Lucilla Lame, MD;  Location: [place];  Service: Endoscopy;  Laterality: N/A;  . ESOPHAGOGASTRODUODENOSCOPY (EGD) WITH PROPOFOL N/A 09/24/2015   Procedure: ESOPHAGOGASTRODUODENOSCOPY (EGD) ;  Surgeon: Lucilla Lame, MD;  Location: [place];  Service: Endoscopy;  Laterality: N/A;  . EYE SURGERY    . PERIPHERAL VASCULAR CATHETERIZATION N/A 05/29/2015   Procedure: A/V Shuntogram/Fistulagram;  Surgeon: Katha Cabal, MD;  Location: [place] [facility];  Service: Cardiovascular;  Laterality: N/A;  . PERIPHERAL VASCULAR CATHETERIZATION N/A 05/29/2015   Procedure: A/V Shunt Intervention;  Surgeon: Katha Cabal, MD;  Location: [place] [facility];  Service: Cardiovascular;  Laterality: N/A;  . POLYPECTOMY  09/24/2015   Procedure: POLYPECTOMY INTESTINAL;  Surgeon: Lucilla Lame, MD;  Location: [place];  Service: Endoscopy;;  cecal polyp ascending polyp    Allergies  Allergies  Allergen Reactions  . Morphine And Related Shortness  Of Breath    Pt reports chest pain and difficulty breathing.  . Gabapentin   . Indomethacin Hives  . Pollen Extract Other (See Comments)    Sinus problems and HA    History of Present Illness    Special Ranes is a 75 y.o. female with PMH as above.  She has a history of bradycardia, previous syncope, syncope, orthostasis.  She is on hemodialysis MWF and takes midodrine 10 mg 3 times daily on those days.  She also has a  history of GERD and follows with GI.  She was seen in clinic 09/27/2019 for syncope.  She was on metoprolol 25 mg twice daily.  Beta-blocker was discontinued.  She presented [facility] ED 07/16/2020 after a syncopal event that occurred shortly after her hemodialysis treatment.  At that time, it was noted BP dropped during hemodialysis treatment.  EKG was without ischemia or arrhythmia.  No reported chest pain or shortness of breath.  Symptoms improved in the ED without any intervention.  She was discharged and followed up in the office.    At follow-up with her primary cardiologist, she continued to deny any symptoms of chest pain or shortness of breath.  Recommendation was to continue midodrine 10 mg 3 times daily on hemodialysis days and approximately 15 to 30 minutes prior to each HD session.  Additional management of HD deferred to nephrology.  It was noted that she had orthostasis with orthostatics positive in clinic.  Recommendation was that midodrine be held on days she did not have HD.  Close monitoring of vitals for any hypertension on midodrine.  She was continued on Pepcid for her symptoms of GERD/nausea with further work-up per PCP or GI if indicated.    Today, 10/23/2020, she returns to clinic and notes 3 recent episodes of syncope during the week leading up to today's visit.  Her most recent episode of syncope occurred yesterday, at which time she hit her knees.  She denies association with hemodialysis; however, given MWF schedule of hemodialysis, suspect that this is the likely trigger for each event.  She reports LOC but did not hit her head.  She also notes fatigue.  No chest pain, shortness of breath, dyspnea, PND, early satiety.  No signs or symptoms of bleeding.  Orthostatics today noted to be positive.  She reports frequent emesis and diarrhea lately for which she has not yet seen GI. She reports she has been remaining well-hydrated during these episodes of emesis and diarrhea, though also  reports she is limited to 30 ounces per day (per nephrology recommendation/Dr. Zollie Scale).  She notes bowel movements are often painful with left lower gastric pain.  Diarrhea and emesis reported twice per week. She does have a regular GI physician with recommendation to follow-up with him regarding this issue.  She continues to follow with nephrology and reports hemodialysis has been maintaining her volume status well, though notes she does not have much fluid to pull off lately.  She is bradycardic and hypotensive today.  On review of her medication list, it appears as if metoprolol has been restarted.  She is uncertain of the reason why this medication was restarted or which MD restarted this medication.  Recommendations as below to discontinue the medication and remain well-hydrated.   Home Medications    Current Outpatient Medications on File Prior to Visit  Medication Sig Dispense Refill  . acetaminophen (TYLENOL) 500 MG tablet Take 1,000 mg by mouth 2 (two) times daily as needed (  pain.).    Marland Kitchen alum & mag hydroxide-simeth (GELUSIL) 295-188-41 MG chewable tablet Chew by mouth every 6 (six) hours as needed for indigestion or heartburn.    . Ascorbic Acid (VITAMIN C) 125 MG CHEW Chew 250 mg by mouth in the morning and at bedtime.    Marland Kitchen b complex-vitamin c-folic acid (NEPHRO-VITE) 0.8 MG TABS tablet Take 1 tablet by mouth daily.    . brimonidine (ALPHAGAN) 0.2 % ophthalmic solution Place 1 drop into both eyes 2 (two) times daily. 5 mL 1  . calcium acetate (PHOSLO) 667 MG capsule Take 667 mg by mouth 3 (three) times daily with meals.     . carbamazepine (TEGRETOL) 100 MG chewable tablet Chew 100 mg by mouth 2 (two) times daily. (0600 & 2100)    . Dexlansoprazole 30 MG capsule Take 30 mg by mouth daily.    Marland Kitchen dicyclomine (BENTYL) 20 MG tablet Take 20 mg by mouth 3 (three) times daily after meals.    . famotidine (PEPCID) 20 MG tablet Take 40 mg by mouth daily as needed for heartburn or indigestion.    .  fluticasone (FLONASE) 50 MCG/ACT nasal spray Place 1 spray into both nostrils daily.     . haloperidol (HALDOL) 5 MG tablet Take 2.5 mg by mouth 3 (three) times a week. (Tuesday, Thursday and Saturday)    . HYDROcodone-acetaminophen (NORCO/VICODIN) 5-325 MG tablet Take 1 tablet by mouth every 4 (four) hours as needed for moderate pain.    Marland Kitchen ipratropium (ATROVENT) 0.03 % nasal spray Place 2 sprays into both nostrils 2 (two) times daily as needed for rhinitis (congestion).    Marland Kitchen lidocaine-prilocaine (EMLA) cream Apply 1 application topically every Monday, Wednesday, and Friday with hemodialysis.   3  . loratadine (CLARITIN) 10 MG tablet Take 10 mg by mouth daily at 6 (six) AM.    . LORazepam (ATIVAN) 0.5 MG tablet Take 0.5 mg by mouth 3 (three) times daily as needed for anxiety.    . midodrine (PROAMATINE) 10 MG tablet Take 1 tablet (10 mg total) by mouth every Monday, Wednesday, and Friday with hemodialysis. May take an additional doses as needed for SBP<100. 40 tablet 0  . Netarsudil-Latanoprost (ROCKLATAN) 0.02-0.005 % SOLN Place 1 drop into both eyes at bedtime.     . ondansetron (ZOFRAN) 4 MG tablet Take 4 mg by mouth every 6 (six) hours as needed for nausea or vomiting.    Marland Kitchen rOPINIRole (REQUIP) 2 MG tablet Take 1 tablet (2 mg total) by mouth daily. 30 tablet 3  . simethicone (MYLICON) 660 MG chewable tablet Chew 125 mg by mouth in the morning, at noon, and at bedtime.    . timolol (TIMOPTIC) 0.5 % ophthalmic solution Place 1 drop into both eyes 2 (two) times daily. 10 mL 1  . traZODone (DESYREL) 50 MG tablet Take 25 mg by mouth at bedtime.    . vitamin B-12 (CYANOCOBALAMIN) 1000 MCG tablet Take 1,000 mcg by mouth daily at 6 (six) AM.    . ziprasidone (GEODON) 20 MG capsule Take 20 mg by mouth at bedtime.     No current facility-administered medications on file prior to visit.    Review of Systems    She denies chest pain, palpitations, dyspnea, pnd, orthopnea, n, edema, weight gain, or early  satiety.  She reports presyncope and also syncopal episodes x3 over the last week.  She has had loss of consciousness.  She did not hit her head.  She reports emesis and diarrhea  twice per week.  She reports lower left quadrant abdominal pain during bowel movements.  She has not yet seen GI.  She denies chest pain, palpitations, dyspnea, PND, orthopnea.  She reports lower extremity edema, weight, and fluid status well controlled with hemodialysis.  All other systems reviewed and are otherwise negative except as noted above.  Physical Exam    VS:  BP 117/65 (BP Location: Right Arm, Patient Position: Sitting, Cuff Size: Normal)   Pulse (!) 51   Ht 5\' 3"  (1.6 m)   Wt 140 lb (63.5 kg)   SpO2 98%   BMI 24.80 kg/m  , BMI Body mass index is 24.8 kg/m. GEN: Well nourished, well developed, in no acute distress. HEENT: normal. Neck: Supple, no JVD, carotid bruits, or masses. Cardiac: Bradycardic but regular, no murmurs, rubs, or gallops. No clubbing, cyanosis, edema.  Radials/DP/PT 2+ and equal bilaterally.  Respiratory:  Respirations regular and unlabored, clear to auscultation bilaterally. GI: Soft, nontender, nondistended, BS + x 4. MS: no deformity or atrophy. Skin: warm and dry, no rash. Neuro:  Strength and sensation are intact. Psych: Normal affect.  Accessory Clinical Findings    ECG personally reviewed by me today -sinus bradycardia, 51 bpm, LVH- no acute changes.  VITALS Reviewed today   Temp Readings from Last 3 Encounters:  07/16/20 98.3 F (36.8 C) (Oral)  06/12/20 98.1 F (36.7 C) (Oral)  05/24/19 98.3 F (36.8 C) (Oral)   BP Readings from Last 3 Encounters:  10/23/20 117/65  08/16/20 (!) 170/60  07/19/20 (!) 199/63   Pulse Readings from Last 3 Encounters:  10/23/20 (!) 51  08/16/20 (!) 53  07/19/20 73    Wt Readings from Last 3 Encounters:  10/23/20 140 lb (63.5 kg)  08/16/20 139 lb (63 kg)  07/19/20 141 lb 9.6 oz (64.2 kg)     LABS  reviewed today   Lab  Results  Component Value Date   WBC 9.6 07/16/2020   HGB 10.7 (L) 07/16/2020   HCT 32.2 (L) 07/16/2020   MCV 98.5 07/16/2020   PLT 191 07/16/2020   Lab Results  Component Value Date   CREATININE 3.34 (H) 07/16/2020   BUN 13 07/16/2020   NA 139 07/16/2020   K 4.0 07/16/2020   CL 99 07/16/2020   CO2 31 07/16/2020   Lab Results  Component Value Date   ALT 9 07/16/2020   AST 13 (L) 07/16/2020   ALKPHOS 52 07/16/2020   BILITOT 0.8 07/16/2020   Lab Results  Component Value Date   CHOL 251 (H) 09/18/2017   HDL 66 09/18/2017   LDLCALC 160 (H) 09/18/2017   TRIG 124 09/18/2017   CHOLHDL 3.8 09/18/2017    Lab Results  Component Value Date   HGBA1C 4.8 09/18/2017   Lab Results  Component Value Date   TSH 2.030 12/14/2017     STUDIES/PROCEDURES reviewed today   05/22/2019 Echocardiogram 1. Left ventricular ejection fraction, by visual estimation, is 60 to  65%. The left ventricle has normal function. Left ventricular septal wall  thickness was normal. Normal left ventricular posterior wall thickness.  There is no left ventricular  hypertrophy.  2. Global right ventricle has normal systolic function.The right  ventricular size is normal. No increase in right ventricular wall  thickness.  3. Left atrial size was normal.  4. Right atrial size was normal.  5. The mitral valve is normal in structure. No evidence of mitral valve  regurgitation.  6. The tricuspid valve is normal in  structure. Tricuspid valve  regurgitation is not demonstrated.  7. The aortic valve is normal in structure. Aortic valve regurgitation is  not visualized.  8. The pulmonic valve was grossly normal. Pulmonic valve regurgitation is  not visualized.  9. Moderately elevated pulmonary artery systolic pressure.   Assessment & Plan    Syncopal episodes --Reports 3 episodes of syncope.  She denies any clear triggers for these episodes, though suspect these episodes occur after her  hemodialysis.  She does have a history of low BP and syncope after HD for which shewas started on midodrine 10 mg 3 times daily on hemodialysis days and 15-55m before her sessions.  She also has a history of bradycardia; however, since her last visit, she has started on metoprolol. Discontinue metoprolol. Encouraged hydration. Suspect most recent syncopal episodes are due to hypotension in the setting of hypovolemia, given low fluid intake and recent emesis/diarrhea. Syncope likely triggered by HD.  In addition, metoprolol likely has exacerbated her bradycardia and hypotension.    Adequate hydration and slow position changes recommended.    Discontinued metoprolol.    Continue midodrine as above.    Update echo to reassess EF and heart structure.    Live Zio monitor x2 weeks to rule out arrhythmia or significant pauses.    Check BMET, CBC, TSH.    Further recommendations, if needed, pending labs and results of echo/Zio.  Orthostatic hypotension --Orthostatics positive today. Adequate hydration encouraged, as well as slow position changes.  Continue midodrine 10 mg 3 times daily on HD days and approximately 15 to 30 minutes before each dialysis session. Discontinue metoprolol.  Could consider addition of thigh-high TED hose/abdominal binder at RTC if ongoing sx.  Sinus bradycardia  -History of bradycardic rate.  Discontinued metoprolol.  Essential hypertension --Hypotensive today with BP 117/65.  Discontinued metoprolol.  GERD --Recommend follow-up with GI, given frequency of diarrhea and emesis.  Discussed adequate hydration with diarrhea/emesis/as well as ensuring no electrolyte abnormalities.  Recommend recheck of BMET today to ensure electrolytes/renal function stable, given the frequency of emesis and diarrhea.  Medication changes: Stop metoprolol. Labs ordered: BMET, CBC, TSH. Studies / Imaging ordered: Echo, live Zio/cardiac monitoring x2 weeks. Future considerations:  None. Disposition: RTC 5 weeks.    Arvil Chaco, PA-C 10/23/2020

## 2020-10-23 NOTE — Patient Instructions (Signed)
Medication Instructions:  Your physician has recommended you make the following change in your medication:   STOP Metoprolol  *If you need a refill on your cardiac medications before your next appointment, please call your pharmacy*   Lab Work: Bmp, Cbc, Tsh today If you have labs (blood work) drawn today and your tests are completely normal, you will receive your results only by: Marland Kitchen MyChart Message (if you have MyChart) OR . A paper copy in the mail If you have any lab test that is abnormal or we need to change your treatment, we will call you to review the results.   Testing/Procedures: Your physician has requested that you have an echocardiogram. Echocardiography is a painless test that uses sound waves to create images of your heart. It provides your doctor with information about the size and shape of your heart and how well your heart's chambers and valves are working. This procedure takes approximately one hour. There are no restrictions for this procedure.  Your physician has recommended that you wear a Live zio AT monitor. Live zio AT  monitors are medical devices that record the heart's electrical activity. Doctors most often [place] these monitors to diagnose arrhythmias. Arrhythmias are problems with the speed or rhythm of the heartbeat. The monitor is a small, portable device. You can wear one while you do your normal daily activities. This is usually used to diagnose what is causing palpitations/syncope (passing out). (To be worn for 14 days)    Follow-Up: At [facility], you and your health needs are our priority.  As part of our continuing mission to provide you with exceptional heart care, we have created designated Provider Care Teams.  These Care Teams include your primary Cardiologist (physician) and Advanced Practice Providers (APPs -  Physician Assistants and Nurse Practitioners) who all work together to provide you with the care you need, when you need it.  We recommend  signing up for the patient portal called "MyChart".  Sign up information is provided on this After Visit Summary.  MyChart is used to connect with patients for Virtual Visits (Telemedicine).  Patients are able to view lab/test results, encounter notes, upcoming appointments, etc.  Non-urgent messages can be sent to your provider as well.   To learn more about what you can do with MyChart, go to [email or web address].    Your next appointment:   5 week(s)  The format for your next appointment:   In Person  Provider:   You may see Kate Sable, MD or one of the following Advanced Practice Providers on your designated Care Team:    Murray Hodgkins, NP  Christell Faith, PA-C  Marrianne Mood, PA-C  Cadence Yaphank, [place]  Laurann Montana, NP    Other Instructions N/A

## 2020-10-24 LAB — CBC WITH DIFFERENTIAL/PLATELET
Basophils Absolute: 0.1 10*3/uL (ref 0.0–0.2)
Basos: 1 %
EOS (ABSOLUTE): 0.2 10*3/uL (ref 0.0–0.4)
Eos: 2 %
Hematocrit: 34.4 % (ref 34.0–46.6)
Hemoglobin: 11.4 g/dL (ref 11.1–15.9)
Immature Grans (Abs): 0 10*3/uL (ref 0.0–0.1)
Immature Granulocytes: 0 %
Lymphocytes Absolute: 1.4 10*3/uL (ref 0.7–3.1)
Lymphs: 20 %
MCH: 33.2 pg — ABNORMAL HIGH (ref 26.6–33.0)
MCHC: 33.1 g/dL (ref 31.5–35.7)
MCV: 100 fL — ABNORMAL HIGH (ref 79–97)
Monocytes Absolute: 0.5 10*3/uL (ref 0.1–0.9)
Monocytes: 7 %
Neutrophils Absolute: 4.9 10*3/uL (ref 1.4–7.0)
Neutrophils: 70 %
Platelets: 283 10*3/uL (ref 150–450)
RBC: 3.43 x10E6/uL — ABNORMAL LOW (ref 3.77–5.28)
RDW: 12.2 % (ref 11.7–15.4)
WBC: 7 10*3/uL (ref 3.4–10.8)

## 2020-10-24 LAB — BASIC METABOLIC PANEL
BUN/Creatinine Ratio: 4 — ABNORMAL LOW (ref 12–28)
BUN: 24 mg/dL (ref 8–27)
CO2: 19 mmol/L — ABNORMAL LOW (ref 20–29)
Calcium: 9.5 mg/dL (ref 8.7–10.3)
Chloride: 96 mmol/L (ref 96–106)
Creatinine, Ser: 5.36 mg/dL — ABNORMAL HIGH (ref 0.57–1.00)
Glucose: 108 mg/dL — ABNORMAL HIGH (ref 65–99)
Potassium: 4.1 mmol/L (ref 3.5–5.2)
Sodium: 143 mmol/L (ref 134–144)
eGFR: 8 mL/min/{1.73_m2} — ABNORMAL LOW (ref >59)

## 2020-10-24 LAB — TSH: TSH: 2.19 u[IU]/mL (ref 0.450–4.500)

## 2020-10-30 ENCOUNTER — Other Ambulatory Visit: Payer: Self-pay

## 2020-10-30 ENCOUNTER — Telehealth: Payer: Self-pay | Admitting: Physician Assistant

## 2020-10-30 DIAGNOSIS — R112 Nausea with vomiting, unspecified: Secondary | ICD-10-CM

## 2020-10-30 NOTE — Telephone Encounter (Signed)
Dr Nadine Counts calling to do Peer to Peer for zio with Elenor Quinones.  Please call [phone] .  Tracking # [id number]

## 2020-10-31 NOTE — Telephone Encounter (Signed)
The peer-to-peer is for the [place] monitor that was ordered at office visit. I asked and was told it would have to be done by provider that ordered Zio.

## 2020-11-01 NOTE — Telephone Encounter (Signed)
Out of the office 10/30/20-10/31/20. Call placed to Dr. Nadine Counts for peer to peer. Informed that the case has expired and now gone to [facility].  I was informed that the monitor was in question given the patient's history of orthostasis sx.  To clarify, the monitor was for syncope with loss of consciousness.   Given the patient report of loss of consciousness, could not completely rule out underlying harmful arrhythmia with recommendation for Zio A, as patient was seen in the outpatient setting, and unable to be monitored to rule out arrhythmia otherwise.    Orthostasis history and arrhythmia can occur simultaneously in a patient.   With syncope / LOC increasing, arrhythmia must be ruled out, as each fall carries a risk in a pt at age 75.

## 2020-11-05 ENCOUNTER — Other Ambulatory Visit: Payer: Self-pay | Admitting: *Deleted

## 2020-11-05 MED ORDER — MIDODRINE HCL 10 MG PO TABS: 10.0000 mg | ORAL_TABLET | ORAL | 0 refills | Status: DC

## 2020-11-07 ENCOUNTER — Telehealth: Payer: Self-pay

## 2020-11-07 NOTE — Telephone Encounter (Signed)
Received a voicemail from the patients relative. Says patient needs a colon screening. Called patient back to schedule and remind her of the referral Dr.Wohl sent for MRI. Patient answered but the relative who helps her with medical concerns wasn't present. Relative will call back

## 2020-11-07 NOTE — Telephone Encounter (Addendum)
error 

## 2020-11-08 ENCOUNTER — Other Ambulatory Visit: Payer: Self-pay

## 2020-11-08 DIAGNOSIS — K219 Gastro-esophageal reflux disease without esophagitis: Secondary | ICD-10-CM

## 2020-11-08 DIAGNOSIS — R112 Nausea with vomiting, unspecified: Secondary | ICD-10-CM

## 2020-11-08 NOTE — Telephone Encounter (Signed)
Doloris Hall, appointment coordinator returned my call and we have scheduled pt for an EGD on 12/11/20. HIDA Scan and [place] scheduled for 12/18/20.

## 2020-11-08 NOTE — Telephone Encounter (Signed)
Error

## 2020-11-08 NOTE — Telephone Encounter (Signed)
LVM for Joyce [name] to return my call to schedule [place], HIDA scan and EGD.

## 2020-11-15 ENCOUNTER — Ambulatory Visit: Payer: Medicare HMO | Admitting: Cardiology

## 2020-11-15 ENCOUNTER — Other Ambulatory Visit: Payer: Self-pay

## 2020-11-15 ENCOUNTER — Telehealth: Payer: Self-pay

## 2020-11-15 ENCOUNTER — Ambulatory Visit (INDEPENDENT_AMBULATORY_CARE_PROVIDER_SITE_OTHER): Payer: Medicare HMO

## 2020-11-15 DIAGNOSIS — R55 Syncope and collapse: Secondary | ICD-10-CM | POA: Diagnosis not present

## 2020-11-15 LAB — ECHOCARDIOGRAM COMPLETE
AR max vel: 2.61 cm2
AV Area VTI: 2.41 cm2
AV Area mean vel: 2.42 cm2
AV Mean grad: 4 mmHg
AV Peak grad: 7.8 mmHg
Ao pk vel: 1.4 m/s
Area-P 1/2: 3.61 cm2
Calc EF: 61.5 %
MV M vel: 4.54 m/s
MV Peak grad: 82.4 mmHg
S' Lateral: 2.6 cm
Single Plane A2C EF: 55.5 %
Single Plane A4C EF: 68.8 %

## 2020-11-15 NOTE — Telephone Encounter (Signed)
Orders have been emailed to [facility] assisted living per request.

## 2020-11-15 NOTE — Telephone Encounter (Signed)
Left a message on voicemail The assistant living that the patient lives in needs printed orders that the doctor signs saying what medication the patient needs to stop and take.   CB [phone]

## 2020-11-29 ENCOUNTER — Encounter: Payer: Self-pay | Admitting: Cardiology

## 2020-11-29 ENCOUNTER — Ambulatory Visit (INDEPENDENT_AMBULATORY_CARE_PROVIDER_SITE_OTHER): Payer: Medicare HMO | Admitting: Cardiology

## 2020-11-29 ENCOUNTER — Other Ambulatory Visit: Payer: Self-pay

## 2020-11-29 VITALS — BP 166/60 | HR 57 | Ht 63.0 in | Wt 142.0 lb

## 2020-11-29 DIAGNOSIS — I10 Essential (primary) hypertension: Secondary | ICD-10-CM | POA: Diagnosis not present

## 2020-11-29 DIAGNOSIS — I953 Hypotension of hemodialysis: Secondary | ICD-10-CM

## 2020-11-29 NOTE — Patient Instructions (Signed)
Medication Instructions:  Your physician recommends that you continue on your current medications as directed. Please refer to the Current Medication list given to you today. *If you need a refill on your cardiac medications before your next appointment, please call your pharmacy*   Lab Work: None ordered If you have labs (blood work) drawn today and your tests are completely normal, you will receive your results only by: . MyChart Message (if you have MyChart) OR . A paper copy in the mail If you have any lab test that is abnormal or we need to change your treatment, we will call you to review the results.   Testing/Procedures: None ordered   Follow-Up: At [facility], you and your health needs are our priority.  As part of our continuing mission to provide you with exceptional heart care, we have created designated Provider Care Teams.  These Care Teams include your primary Cardiologist (physician) and Advanced Practice Providers (APPs -  Physician Assistants and Nurse Practitioners) who all work together to provide you with the care you need, when you need it.  We recommend signing up for the patient portal called "MyChart".  Sign up information is provided on this After Visit Summary.  MyChart is used to connect with patients for Virtual Visits (Telemedicine).  Patients are able to view lab/test results, encounter notes, upcoming appointments, etc.  Non-urgent messages can be sent to your provider as well.   To learn more about what you can do with MyChart, go to [email or web address].    Your next appointment:   Follow up as needed   The format for your next appointment:   In Person  Provider:   Brian Agbor-Etang, MD   Other Instructions   

## 2020-11-29 NOTE — Progress Notes (Signed)
Cardiology Office Note:    Date:  11/29/2020   ID:  Joyce [name], Joyce [name] July 20, 1946, MRN [id number]  PCP:  Orvis Brill, [facility]  Cardiologist:  Kate Sable, MD  Electrophysiologist:  None   Referring MD: Orvis Brill, [facility]*   Chief Complaint  Patient presents with  . Other    5 week follow up post Zio and Echo. Patient c.o chest pain in the center of chest through the night. Meds reviewed verbally with patient.     History of Present Illness:    Joyce [name] is a 75 y.o. female with a hx of GERD, hypertension, end-stage renal disease on dialysis MWF, hemodialysis induced hypotension who presents for follow-up.    She has a history of bradycardia, dizziness and syncope.  Beta-blocker was previously stopped.  Hypotension and dizziness according the context of hemodialysis.  Midodrine 10 mg was started and patient advised to take on dialysis days 15 to 30 minutes prior to HD.  Cardiac monitor was placed at last visit to evaluate any significant arrhythmias as cause for syncope.  She still has low blood pressures after dialysis.  Has some central chest discomfort secondary to indigestion, improved with taking PPI.  Prior notes Echocardiogram 05/2019 showed normal systolic function, EF 60 to 65%. Echo 6/73/4193 normal systolic function, EF 55 to 60%.  Grade 2 diastolic dysfunction. Cardiac monitor 11/13/2020, no significant arrhythmias to explain syncope, occasional SVTs.  Past Medical History:  Diagnosis Date  . Anal fissure   . Bipolar affective disorder ([place])   . CKD (chronic kidney disease)    Dr Holley Raring [name] 4  . Colon polyps   . Diverticulitis   . Diverticulitis   . Family history of adverse reaction to anesthesia    mom - PONV  . GERD (gastroesophageal reflux disease)   . Headache    migraines - none over 10 yrs  . Heart murmur   . History of hiatal hernia   . Hypertension   . Pancreatitis    Valproic acid  . Vertigo     Past Surgical  History:  Procedure Laterality Date  . A/V FISTULAGRAM Left 04/14/2017   Procedure: A/V Fistulagram;  Surgeon: Katha Cabal, MD;  Location: [place] [facility];  Service: Cardiovascular;  Laterality: Left;  . A/V FISTULAGRAM Left 06/09/2017   Procedure: A/V FISTULAGRAM;  Surgeon: Katha Cabal, MD;  Location: [place] [facility];  Service: Cardiovascular;  Laterality: Left;  . A/V FISTULAGRAM Left 11/02/2017   Procedure: A/V FISTULAGRAM;  Surgeon: Algernon Huxley, MD;  Location: [place] [facility];  Service: Cardiovascular;  Laterality: Left;  . A/V FISTULAGRAM Left 12/22/2018   Procedure: A/V FISTULAGRAM;  Surgeon: Katha Cabal, MD;  Location: [place] [facility];  Service: Cardiovascular;  Laterality: Left;  . A/V FISTULAGRAM Left 06/12/2020   Procedure: A/V FISTULAGRAM;  Surgeon: Katha Cabal, MD;  Location: [place] [facility];  Service: Cardiovascular;  Laterality: Left;  . A/V SHUNT INTERVENTION N/A 11/02/2017   Procedure: A/V SHUNT INTERVENTION;  Surgeon: Algernon Huxley, MD;  Location: [place] [facility];  Service: Cardiovascular;  Laterality: N/A;  . ABDOMINAL HYSTERECTOMY  1990 ?  . AV FISTULA PLACEMENT  4/30  . BREAST EXCISIONAL BIOPSY Left 1994   neg surgical bx  . CHOLECYSTECTOMY  2003  . COLONOSCOPY  2014   Dr. Jamal Collin  . COLONOSCOPY WITH PROPOFOL N/A 09/24/2015   Procedure: COLONOSCOPY WITH random colon byopies.;  Surgeon: Lucilla Lame, MD;  Location:  [place];  Service: Endoscopy;  Laterality: N/A;  . ESOPHAGOGASTRODUODENOSCOPY (EGD) WITH PROPOFOL N/A 09/24/2015   Procedure: ESOPHAGOGASTRODUODENOSCOPY (EGD) ;  Surgeon: Lucilla Lame, MD;  Location: [place];  Service: Endoscopy;  Laterality: N/A;  . EYE SURGERY    . PERIPHERAL VASCULAR CATHETERIZATION N/A 05/29/2015   Procedure: A/V Shuntogram/Fistulagram;  Surgeon: Katha Cabal, MD;  Location: [place] [facility];  Service: Cardiovascular;  Laterality: N/A;  . PERIPHERAL VASCULAR  CATHETERIZATION N/A 05/29/2015   Procedure: A/V Shunt Intervention;  Surgeon: Katha Cabal, MD;  Location: [place] [facility];  Service: Cardiovascular;  Laterality: N/A;  . POLYPECTOMY  09/24/2015   Procedure: POLYPECTOMY INTESTINAL;  Surgeon: Lucilla Lame, MD;  Location: [place];  Service: Endoscopy;;  cecal polyp ascending polyp    Current Medications: Current Meds  Medication Sig  . acetaminophen (TYLENOL) 500 MG tablet Take 1,000 mg by mouth 2 (two) times daily as needed (pain.).  Marland Kitchen alum & mag hydroxide-simeth (GELUSIL) 440-347-42 MG chewable tablet Chew by mouth every 6 (six) hours as needed for indigestion or heartburn.  Marland Kitchen b complex-vitamin c-folic acid (NEPHRO-VITE) 0.8 MG TABS tablet Take 1 tablet by mouth daily.  . brimonidine (ALPHAGAN) 0.2 % ophthalmic solution Place 1 drop into both eyes 2 (two) times daily.  . calcium acetate (PHOSLO) 667 MG capsule Take 667 mg by mouth 3 (three) times daily with meals.   . carbamazepine (TEGRETOL) 100 MG chewable tablet Chew 100 mg by mouth 2 (two) times daily. (0600 & 2100)  . Dexlansoprazole 30 MG capsule Take 30 mg by mouth daily.  Marland Kitchen dicyclomine (BENTYL) 20 MG tablet Take 20 mg by mouth 3 (three) times daily after meals.  . famotidine (PEPCID) 20 MG tablet Take 40 mg by mouth daily as needed for heartburn or indigestion.  . fluticasone (FLONASE) 50 MCG/ACT nasal spray Place 1 spray into both nostrils daily.   . haloperidol (HALDOL) 5 MG tablet Take 2.5 mg by mouth 3 (three) times a week. (Tuesday, Thursday and Saturday)  . HYDROcodone-acetaminophen (NORCO/VICODIN) 5-325 MG tablet Take 1 tablet by mouth every 4 (four) hours as needed for moderate pain.  Marland Kitchen ipratropium (ATROVENT) 0.03 % nasal spray Place 2 sprays into both nostrils 2 (two) times daily as needed for rhinitis (congestion).  Marland Kitchen lidocaine-prilocaine (EMLA) cream Apply 1 application topically every Monday, Wednesday, and Friday with hemodialysis.   Marland Kitchen loratadine  (CLARITIN) 10 MG tablet Take 10 mg by mouth daily at 6 (six) AM.  . LORazepam (ATIVAN) 0.5 MG tablet Take 0.5 mg by mouth 3 (three) times daily as needed for anxiety.  . midodrine (PROAMATINE) 10 MG tablet Take 1 tablet (10 mg total) by mouth every Monday, Wednesday, and Friday with hemodialysis. May take an additional doses as needed for SBP<100.  Marland Kitchen Netarsudil-Latanoprost (ROCKLATAN) 0.02-0.005 % SOLN Place 1 drop into both eyes at bedtime.   . ondansetron (ZOFRAN) 4 MG tablet Take 4 mg by mouth every 6 (six) hours as needed for nausea or vomiting.  Marland Kitchen rOPINIRole (REQUIP) 2 MG tablet Take 1 tablet (2 mg total) by mouth daily.  . simethicone (MYLICON) 595 MG chewable tablet Chew 125 mg by mouth in the morning, at noon, and at bedtime.  . timolol (TIMOPTIC) 0.5 % ophthalmic solution Place 1 drop into both eyes 2 (two) times daily.  . traZODone (DESYREL) 50 MG tablet Take 25 mg by mouth at bedtime.  . vitamin B-12 (CYANOCOBALAMIN) 1000 MCG tablet Take 1,000 mcg by mouth daily  at 6 (six) AM.  . ziprasidone (GEODON) 20 MG capsule Take 20 mg by mouth at bedtime.     Allergies:   Morphine and related, Gabapentin, Indomethacin, and Pollen extract   Social History   Socioeconomic History  . Marital status: Divorced    Spouse name: Not on file  . Number of children: 1  . Years of education: Not on file  . Highest education level: Not on file  Occupational History  . 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 Smoking status: Never Smoker  . Smokeless tobacco: Never Used  Vaping Use  . Vaping Use: Never used  Substance and Sexual Activity  . Alcohol use: No    Alcohol/week: 0.0 standard drinks  . Drug use: No  . Sexual activity: Not Currently  Other Topics Concern  . 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   Social Determinants of Health   Financial Resource  Strain: Not on file  Food Insecurity: Not on file  Transportation Needs: Not on file  Physical Activity: Not on file  Stress: Not on file  Social Connections: Not on file     Family History: The patient's family history includes Breast cancer in an other family member; Hypertension in her father; Stroke in her father. There is no history of Colon cancer, Liver disease, or Mental illness.  ROS:   Please see the history of present illness.     All other systems reviewed and are negative.  EKGs/Labs/Other Studies Reviewed:    The following studies were reviewed today:   EKG:  EKG is  ordered today.  The ekg ordered today demonstrates sinus bradycardia, heart rate 57.  Recent Labs: 07/16/2020: ALT 9 10/23/2020: BUN 24; Creatinine, Ser 5.36; Hemoglobin 11.4; Platelets 283; Potassium 4.1; Sodium 143; TSH 2.190  Recent Lipid Panel    Component Value Date/Time   CHOL 251 (H) 09/18/2017 0646   CHOL 105 05/14/2014 0412   TRIG 124 09/18/2017 0646   TRIG 127 05/14/2014 0412   HDL 66 09/18/2017 0646   HDL 37 (L) 05/14/2014 0412   CHOLHDL 3.8 09/18/2017 0646   VLDL 25 09/18/2017 0646   VLDL 25 05/14/2014 0412   LDLCALC 160 (H) 09/18/2017 0646   LDLCALC 43 05/14/2014 0412    Physical Exam:    VS:  BP (!) 166/60 (BP Location: Right Arm, Patient Position: Sitting, Cuff Size: Normal)   Pulse (!) 57   Ht 5\' 3"  (1.6 m)   Wt 142 lb (64.4 kg)   SpO2 98%   BMI 25.15 kg/m     Wt Readings from Last 3 Encounters:  11/29/20 142 lb (64.4 kg)  10/23/20 140 lb (63.5 kg)  08/16/20 139 lb (63 kg)     GEN:  Well nourished, well developed in no acute distress HEENT: Normal NECK: No JVD; No carotid bruits LYMPHATICS: No lymphadenopathy CARDIAC: RRR, systolic murmur noted on exam, consistent with AV fistula present RESPIRATORY:  Clear to auscultation without rales, wheezing or rhonchi  ABDOMEN: Soft, non-tender, non-distended MUSCULOSKELETAL:  No edema; left arm AV fistula noted SKIN: Warm  and dry NEUROLOGIC:  Alert and oriented x 3 PSYCHIATRIC:  Normal affect   ASSESSMENT:    1. Hemodialysis-associated hypotension   2. Essential hypertension    PLAN:      1. Hemodialysis induced hypotension.  Cardiac monitor  with no significant arrhythmia.  Continue midodrine 10 mg on days of dialysis, Monday Wednesday Friday, about 15 to 30 minutes prior to dialysis session.  Advised patient to discuss with nephrology regarding trying ultrafiltration if that will help with low BP during dialysis. 2. Hypertension, BP elevated, likely from midodrine.  History of orthostasis.  Midodrine's, CKD likely etiology.  Okay for BP to run slightly high due to history of profound hypotension and syncope.  Systolic 540J okay.  Follow-up as needed  This note was generated in part or whole with voice recognition software. Voice recognition is usually quite accurate but there are transcription errors that can and very often do occur. I apologize for any typographical errors that were not detected and corrected.  Medication Adjustments/Labs and Tests Ordered: Current medicines are reviewed at length with the patient today.  Concerns regarding medicines are outlined above.  Orders Placed This Encounter  Procedures  . EKG 12-Lead   No orders of the defined types were placed in this encounter.   Patient Instructions  Medication Instructions:  Your physician recommends that you continue on your current medications as directed. Please refer to the Current Medication list given to you today.  *If you need a refill on your cardiac medications before your next appointment, please call your pharmacy*   Lab Work: None ordered If you have labs (blood work) drawn today and your tests are completely normal, you will receive your results only by: Marland Kitchen MyChart Message (if you have MyChart) OR . A paper copy in the mail If you have any lab test that is abnormal or we need to change your treatment, we will call  you to review the results.   Testing/Procedures: None ordered   Follow-Up: At [facility], you and your health needs are our priority.  As part of our continuing mission to provide you with exceptional heart care, we have created designated Provider Care Teams.  These Care Teams include your primary Cardiologist (physician) and Advanced Practice Providers (APPs -  Physician Assistants and Nurse Practitioners) who all work together to provide you with the care you need, when you need it.  We recommend signing up for the patient portal called "MyChart".  Sign up information is provided on this After Visit Summary.  MyChart is used to connect with patients for Virtual Visits (Telemedicine).  Patients are able to view lab/test results, encounter notes, upcoming appointments, etc.  Non-urgent messages can be sent to your provider as well.   To learn more about what you can do with MyChart, go to [email or web address].    Your next appointment:   Follow up as needed   The format for your next appointment:   In Person  Provider:   Kate Sable, MD   Other Instructions      Signed, Kate Sable, MD  11/29/2020 1:01 PM    [place]

## 2020-12-10 ENCOUNTER — Encounter: Payer: Self-pay | Admitting: Gastroenterology

## 2020-12-11 ENCOUNTER — Ambulatory Visit: Payer: Medicare HMO | Admitting: Anesthesiology

## 2020-12-11 ENCOUNTER — Encounter
Admission: RE | Disposition: A | Discharge status: Home / Self Care | Payer: Self-pay | Source: Ambulatory Visit | Attending: Gastroenterology

## 2020-12-11 ENCOUNTER — Ambulatory Visit
Admission: RE | Admit: 2020-12-11 | Discharge: 2020-12-11 | Disposition: A | Discharge status: Home / Self Care | Payer: Medicare HMO | Source: Ambulatory Visit | Attending: Gastroenterology | Admitting: Gastroenterology

## 2020-12-11 ENCOUNTER — Encounter: Payer: Self-pay | Admitting: Gastroenterology

## 2020-12-11 ENCOUNTER — Other Ambulatory Visit: Payer: Self-pay

## 2020-12-11 DIAGNOSIS — Z803 Family history of malignant neoplasm of breast: Secondary | ICD-10-CM | POA: Insufficient documentation

## 2020-12-11 DIAGNOSIS — N184 Chronic kidney disease, stage 4 (severe): Secondary | ICD-10-CM | POA: Diagnosis not present

## 2020-12-11 DIAGNOSIS — Z7951 Long term (current) use of inhaled steroids: Secondary | ICD-10-CM | POA: Diagnosis not present

## 2020-12-11 DIAGNOSIS — R112 Nausea with vomiting, unspecified: Secondary | ICD-10-CM | POA: Insufficient documentation

## 2020-12-11 DIAGNOSIS — Z888 Allergy status to other drugs, medicaments and biological substances status: Secondary | ICD-10-CM | POA: Insufficient documentation

## 2020-12-11 DIAGNOSIS — K319 Disease of stomach and duodenum, unspecified: Secondary | ICD-10-CM | POA: Insufficient documentation

## 2020-12-11 DIAGNOSIS — K219 Gastro-esophageal reflux disease without esophagitis: Secondary | ICD-10-CM | POA: Insufficient documentation

## 2020-12-11 DIAGNOSIS — I129 Hypertensive chronic kidney disease with stage 1 through stage 4 chronic kidney disease, or unspecified chronic kidney disease: Secondary | ICD-10-CM | POA: Insufficient documentation

## 2020-12-11 DIAGNOSIS — K259 Gastric ulcer, unspecified as acute or chronic, without hemorrhage or perforation: Secondary | ICD-10-CM | POA: Insufficient documentation

## 2020-12-11 DIAGNOSIS — Z79899 Other long term (current) drug therapy: Secondary | ICD-10-CM | POA: Diagnosis not present

## 2020-12-11 DIAGNOSIS — K2970 Gastritis, unspecified, without bleeding: Secondary | ICD-10-CM | POA: Diagnosis not present

## 2020-12-11 DIAGNOSIS — Z885 Allergy status to narcotic agent status: Secondary | ICD-10-CM | POA: Insufficient documentation

## 2020-12-11 DIAGNOSIS — Z823 Family history of stroke: Secondary | ICD-10-CM | POA: Insufficient documentation

## 2020-12-11 DIAGNOSIS — Z8249 Family history of ischemic heart disease and other diseases of the circulatory system: Secondary | ICD-10-CM | POA: Insufficient documentation

## 2020-12-11 HISTORY — PX: ESOPHAGOGASTRODUODENOSCOPY (EGD) WITH PROPOFOL: SHX5813

## 2020-12-11 SURGERY — ESOPHAGOGASTRODUODENOSCOPY (EGD) WITH PROPOFOL
Anesthesia: General

## 2020-12-11 MED ORDER — PROPOFOL 10 MG/ML IV BOLUS
INTRAVENOUS | Status: DC | PRN
  Administered 2020-12-11: 100 mg via INTRAVENOUS

## 2020-12-11 MED ORDER — PROPOFOL 500 MG/50ML IV EMUL
INTRAVENOUS | Status: AC
  Filled 2020-12-11: qty 50

## 2020-12-11 MED ORDER — PROPOFOL 10 MG/ML IV BOLUS
INTRAVENOUS | Status: AC
  Filled 2020-12-11: qty 20

## 2020-12-11 MED ORDER — SODIUM CHLORIDE 0.9 % IV SOLN
INTRAVENOUS | Status: DC
Start: 2020-12-11 — End: 2020-12-11

## 2020-12-11 NOTE — Transfer of Care (Signed)
Immediate Anesthesia Transfer of Care Note  Patient: Joyce [name]  Procedure(s) Performed: ESOPHAGOGASTRODUODENOSCOPY (EGD) WITH PROPOFOL (N/A )  Patient Location: PACU and Endoscopy Unit  Anesthesia Type:General  Level of Consciousness: drowsy and patient cooperative  Airway & Oxygen Therapy: Patient Spontanous Breathing  Post-op Assessment: Report given to RN and Post -op Vital signs reviewed and stable  Post vital signs: Reviewed and stable  Last Vitals:  Vitals Value Taken Time  BP 164/56 12/11/20 0802  Temp 36.8 C 12/11/20 0801  Pulse 63 12/11/20 0810  Resp 12 12/11/20 0810  SpO2 100 % 12/11/20 0810  Vitals shown include unvalidated device data.  Last Pain:  Vitals:   12/11/20 0801  TempSrc: Temporal  PainSc: Asleep         Complications: No complications documented.

## 2020-12-11 NOTE — H&P (Signed)
Lucilla Lame, MD [address] [place], [place] [address] Phone:[phone] Fax : [phone]  Primary Care Physician:  [facility] Primary Gastroenterologist:  Dr. Allen Norris  Pre-Procedure History & Physical: HPI:  Joyce [name] is a 75 y.o. female is here for an endoscopy.   Past Medical History:  Diagnosis Date  . Anal fissure   . Bipolar affective disorder ([place])   . CKD (chronic kidney disease)    Dr Holley Raring [name] 4  . Colon polyps   . Diverticulitis   . Diverticulitis   . Family history of adverse reaction to anesthesia    mom - PONV  . GERD (gastroesophageal reflux disease)   . Headache    migraines - none over 10 yrs  . Heart murmur   . History of hiatal hernia   . Hypertension   . Pancreatitis    Valproic acid  . Vertigo     Past Surgical History:  Procedure Laterality Date  . A/V FISTULAGRAM Left 04/14/2017   Procedure: A/V Fistulagram;  Surgeon: Katha Cabal, MD;  Location: [place] [facility];  Service: Cardiovascular;  Laterality: Left;  . A/V FISTULAGRAM Left 06/09/2017   Procedure: A/V FISTULAGRAM;  Surgeon: Katha Cabal, MD;  Location: [place] [facility];  Service: Cardiovascular;  Laterality: Left;  . A/V FISTULAGRAM Left 11/02/2017   Procedure: A/V FISTULAGRAM;  Surgeon: Algernon Huxley, MD;  Location: [place] [facility];  Service: Cardiovascular;  Laterality: Left;  . A/V FISTULAGRAM Left 12/22/2018   Procedure: A/V FISTULAGRAM;  Surgeon: Katha Cabal, MD;  Location: [place] [facility];  Service: Cardiovascular;  Laterality: Left;  . A/V FISTULAGRAM Left 06/12/2020   Procedure: A/V FISTULAGRAM;  Surgeon: Katha Cabal, MD;  Location: [place] [facility];  Service: Cardiovascular;  Laterality: Left;  . A/V SHUNT INTERVENTION N/A 11/02/2017   Procedure: A/V SHUNT INTERVENTION;  Surgeon: Algernon Huxley, MD;  Location: [place] [facility];  Service: Cardiovascular;  Laterality: N/A;  . ABDOMINAL  HYSTERECTOMY  1990 ?  . AV FISTULA PLACEMENT  4/30  . BREAST EXCISIONAL BIOPSY Left 1994   neg surgical bx  . CHOLECYSTECTOMY  2003  . COLONOSCOPY  2014   Dr. Jamal Collin  . COLONOSCOPY WITH PROPOFOL N/A 09/24/2015   Procedure: COLONOSCOPY WITH random colon byopies.;  Surgeon: Lucilla Lame, MD;  Location: [place];  Service: Endoscopy;  Laterality: N/A;  . ESOPHAGOGASTRODUODENOSCOPY (EGD) WITH PROPOFOL N/A 09/24/2015   Procedure: ESOPHAGOGASTRODUODENOSCOPY (EGD) ;  Surgeon: Lucilla Lame, MD;  Location: [place];  Service: Endoscopy;  Laterality: N/A;  . EYE SURGERY    . PERIPHERAL VASCULAR CATHETERIZATION N/A 05/29/2015   Procedure: A/V Shuntogram/Fistulagram;  Surgeon: Katha Cabal, MD;  Location: [place] [facility];  Service: Cardiovascular;  Laterality: N/A;  . PERIPHERAL VASCULAR CATHETERIZATION N/A 05/29/2015   Procedure: A/V Shunt Intervention;  Surgeon: Katha Cabal, MD;  Location: [place] [facility];  Service: Cardiovascular;  Laterality: N/A;  . POLYPECTOMY  09/24/2015   Procedure: POLYPECTOMY INTESTINAL;  Surgeon: Lucilla Lame, MD;  Location: [place];  Service: Endoscopy;;  cecal polyp ascending polyp    Prior to Admission medications   Medication Sig Start Date End Date Taking? Authorizing Provider  acetaminophen (TYLENOL) 500 MG tablet Take 1,000 mg by mouth 2 (two) times daily as needed (pain.).   Yes [provider]  alum & mag hydroxide-simeth (GELUSIL) 200-200-25 MG chewable tablet Chew by mouth every 6 (six) hours  as needed for indigestion or heartburn.   Yes [provider]  b complex-vitamin c-folic acid (NEPHRO-VITE) 0.8 MG TABS tablet Take 1 tablet by mouth daily.   Yes [provider]  brimonidine (ALPHAGAN) 0.2 % ophthalmic solution Place 1 drop into both eyes 2 (two) times daily. 10/22/17  Yes Clapacs, [name], MD  calcium acetate (PHOSLO) 667 MG capsule Take 667 mg by mouth 3 (three) times daily with meals.   01/04/19  Yes [provider]  carbamazepine (TEGRETOL) 100 MG chewable tablet Chew 100 mg by mouth 2 (two) times daily. (0600 & 2100)   Yes [provider]  Dexlansoprazole 30 MG capsule Take 30 mg by mouth daily.   Yes [provider]  dicyclomine (BENTYL) 20 MG tablet Take 20 mg by mouth 3 (three) times daily after meals.   Yes [provider]  famotidine (PEPCID) 20 MG tablet Take 40 mg by mouth daily as needed for heartburn or indigestion.   Yes [provider]  fluticasone (FLONASE) 50 MCG/ACT nasal spray Place 1 spray into both nostrils daily.    Yes [provider]  haloperidol (HALDOL) 5 MG tablet Take 2.5 mg by mouth 3 (three) times a week. (Tuesday, Thursday and Saturday)   Yes [provider]  ipratropium (ATROVENT) 0.03 % nasal spray Place 2 sprays into both nostrils 2 (two) times daily as needed for rhinitis (congestion).   Yes [provider]  lidocaine-prilocaine (EMLA) cream Apply 1 application topically every Monday, Wednesday, and Friday with hemodialysis.    Yes [provider]  loratadine (CLARITIN) 10 MG tablet Take 10 mg by mouth daily at 6 (six) AM.   Yes [provider]  LORazepam (ATIVAN) 0.5 MG tablet Take 0.5 mg by mouth 3 (three) times daily as needed for anxiety.   Yes [provider]  midodrine (PROAMATINE) 10 MG tablet Take 1 tablet (10 mg total) by mouth every Monday, Wednesday, and Friday with hemodialysis. May take an additional doses as needed for SBP<100. 11/05/20  Yes Agbor-Etang, Aaron Edelman, MD  Netarsudil-Latanoprost (ROCKLATAN) 0.02-0.005 % SOLN Place 1 drop into both eyes at bedtime.    Yes [provider]  ondansetron (ZOFRAN) 4 MG tablet Take 4 mg by mouth every 6 (six) hours as needed for nausea or vomiting.   Yes [provider]  rOPINIRole (REQUIP) 2 MG tablet Take 1 tablet (2 mg total) by mouth daily. 03/18/18  Yes [name], [name], MD   simethicone (MYLICON) 326 MG chewable tablet Chew 125 mg by mouth in the morning, at noon, and at bedtime.   Yes [provider]  timolol (TIMOPTIC) 0.5 % ophthalmic solution Place 1 drop into both eyes 2 (two) times daily. 10/22/17  Yes Clapacs, [name], MD  traZODone (DESYREL) 50 MG tablet Take 25 mg by mouth at bedtime.   Yes [provider]  vitamin B-12 (CYANOCOBALAMIN) 1000 MCG tablet Take 1,000 mcg by mouth daily at 6 (six) AM.   Yes [provider]  ziprasidone (GEODON) 20 MG capsule Take 20 mg by mouth at bedtime.   Yes [provider]  HYDROcodone-acetaminophen (NORCO/VICODIN) 5-325 MG tablet Take 1 tablet by mouth every 4 (four) hours as needed for moderate pain. Patient not taking: Reported on 12/11/2020    [provider]    Allergies as of 11/08/2020 - Review Complete 10/23/2020  Allergen Reaction Noted  . Morphine and related Shortness Of Breath 10/09/2016  . Gabapentin  01/26/2019  . Indomethacin Hives  05/10/2013  . Pollen extract Other (See Comments) 12/25/2014    Family History  Problem Relation Age of Onset  . Stroke Father   . Hypertension Father   . Breast cancer Other   . Colon cancer Neg Hx   . Liver disease Neg Hx   . Mental illness Neg Hx     Social History   Socioeconomic History  . Marital status: Divorced    Spouse name: Not on file  . Number of children: 1  . Years of education: Not on file  . Highest education level: Not on file  Occupational History  . 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 Smoking status: Never Smoker  . Smokeless tobacco: Never Used  Vaping Use  . Vaping Use: Never used  Substance and Sexual Activity  . Alcohol use: No    Alcohol/week: 0.0 standard drinks  . Drug use: No  . Sexual activity: Not Currently  Other Topics Concern  . 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   Social Determinants of Health   Financial Resource Strain: Not on file  Food Insecurity: Not on file  Transportation Needs: Not on file  Physical Activity: Not on file  Stress: Not on file  Social Connections: Not on file  Intimate Partner Violence: Not on file    Review of Systems: See HPI, otherwise negative ROS  Physical Exam: BP (!) 161/53   Pulse 64   Temp (!) 97.3 F (36.3 C) (Temporal)   Resp 18   Ht 5\' 3"  (1.6 m)   Wt 67.6 kg   SpO2 97%   BMI 26.39 kg/m  General:   Alert,  pleasant and cooperative in NAD Head:  Normocephalic and atraumatic. Neck:  Supple; no masses or thyromegaly. Lungs:  Clear throughout to auscultation.    Heart:  Regular rate and rhythm. Abdomen:  Soft, nontender and nondistended. Normal bowel sounds, without guarding, and without rebound.   Neurologic:  Alert and  oriented x4;  grossly normal neurologically.  Impression/Plan: Preslea Rhodus is here for an endoscopy to be performed for nausea and vomiting   Risks, benefits, limitations, and alternatives regarding  endoscopy have been reviewed with the patient.  Questions have been answered.  All parties agreeable.   Lucilla Lame, MD  12/11/2020, 7:40 AM

## 2020-12-11 NOTE — Op Note (Signed)
[facility] Gastroenterology Patient Name: Joyce [name] Procedure Date: 12/11/2020 7:51 AM MRN: [id number] Account #: [id number] Date of Birth: [birth year] Admit Type: Outpatient Age: 75 Room: [facility] ENDO ROOM 4 Gender: Female Note Status: Finalized Procedure:             Upper GI endoscopy Indications:           Nausea with vomiting Providers:             Lucilla Lame MD, MD Referring MD:          [facility] (Referring MD) Medicines:             Propofol per Anesthesia Complications:         No immediate complications. Procedure:             Pre-Anesthesia Assessment:                        - Prior to the procedure, a History and Physical was                         performed, and patient medications and allergies were                         reviewed. The patient's tolerance of previous                         anesthesia was also reviewed. The risks and benefits                         of the procedure and the sedation options and risks                         were discussed with the patient. All questions were                         answered, and informed consent was obtained. Prior                         Anticoagulants: The patient has taken no previous                         anticoagulant or antiplatelet agents. ASA Grade                         Assessment: II - A patient with mild systemic disease.                         After reviewing the risks and benefits, the patient                         was deemed in satisfactory condition to undergo the                         procedure.                        After obtaining informed consent, the endoscope was  passed under direct vision. Throughout the procedure,                         the patient's blood pressure, pulse, and oxygen                         saturations were monitored continuously. The Endoscope                         was introduced through the mouth, and  advanced to the                         second part of duodenum. The upper GI endoscopy was                         accomplished without difficulty. The patient tolerated                         the procedure well. Findings:      The Z-line was irregular and was found at the gastroesophageal junction.       Biopsies were taken with a cold forceps for histology.      Moderate inflammation characterized by erythema was found in the gastric       antrum. Biopsies were taken with a cold forceps for histology.      The examined duodenum was normal. Impression:            - Z-line irregular, at the gastroesophageal junction.                         Biopsied.                        - Gastritis. Biopsied.                        - Normal examined duodenum. Recommendation:        - Discharge patient to home.                        - Resume previous diet.                        - Continue present medications.                        - Await pathology results. Procedure Code(s):     --- Professional ---                        [phone], Esophagogastroduodenoscopy, flexible,                         transoral; with biopsy, single or multiple Diagnosis Code(s):     --- Professional ---                        R11.2, Nausea with vomiting, unspecified                        K29.70, Gastritis, unspecified, without bleeding CPT copyright 2019 American Medical Association. All rights reserved. The codes documented in this  report are preliminary and upon coder review may  be revised to meet current compliance requirements. Lucilla Lame MD, MD 12/11/2020 7:58:59 AM This report has been signed electronically. Number of Addenda: 0 Note Initiated On: 12/11/2020 7:51 AM Estimated Blood Loss:  Estimated blood loss: none.      [facility]

## 2020-12-11 NOTE — Anesthesia Preprocedure Evaluation (Addendum)
Anesthesia Evaluation  Patient identified by MRN, date of birth, ID band Patient awake    Reviewed: Allergy & Precautions, NPO status , Patient's Chart, lab work & pertinent test results  History of Anesthesia Complications (+) Family history of anesthesia reactionNegative for: history of anesthetic complications  Airway Mallampati: II  TM Distance: >3 FB Neck ROM: Full    Dental no notable dental hx.    Pulmonary neg pulmonary ROS, neg sleep apnea, neg COPD,    breath sounds clear to auscultation- rhonchi (-) wheezing      Cardiovascular hypertension, Pt. on medications (-) CAD, (-) Past MI, (-) Cardiac Stents and (-) CABG + Valvular Problems/Murmurs  Rhythm:Regular Rate:Normal - Systolic murmurs and - Diastolic murmurs    Neuro/Psych  Headaches, PSYCHIATRIC DISORDERS Bipolar Disorder    GI/Hepatic Neg liver ROS, hiatal hernia, GERD  ,  Endo/Other  negative endocrine ROSneg diabetes  Renal/GU ESRF and DialysisRenal disease (last dialysis 10/20/17 )     Musculoskeletal negative musculoskeletal ROS (+)   Abdominal (+) - obese,   Peds  Hematology  (+) anemia ,   Anesthesia Other Findings Anal fissure Bipolar affective disorder (HCC) CKD (chronic kidney disease)     Comment:  Dr Holley Raring [name] 4 Colon polyps  Diverticulitis Diverticulitis Family history of adverse reaction to anesthesia     Comment:  mom - PONV No date: GERD (gastroesophageal reflux disease) No date: Headache     Comment:  migraines - none over 10 yrs No date: Heart murmur No date: History of hiatal hernia No date: Hypertension No date: Pancreatitis     Comment:  Valproic acid No date: Vertigo   Reproductive/Obstetrics                             Anesthesia Physical  Anesthesia Plan  ASA: IV  Anesthesia Plan: General   Post-op Pain Management:    Induction: Intravenous  PONV Risk Score and Plan: 2 and  Propofol infusion and TIVA  Airway Management Planned: Natural Airway and Nasal Cannula  Additional Equipment:   Intra-op Plan:   Post-operative Plan:   Informed Consent: I have reviewed the patients History and Physical, chart, labs and discussed the procedure including the risks, benefits and alternatives for the proposed anesthesia with the patient or authorized representative who has indicated his/her understanding and acceptance.     Dental advisory given  Plan Discussed with: CRNA, Anesthesiologist and Surgeon  Anesthesia Plan Comments:        Anesthesia Quick Evaluation

## 2020-12-11 NOTE — Anesthesia Postprocedure Evaluation (Signed)
Anesthesia Post Note  Patient: Joyce [name]  Procedure(s) Performed: ESOPHAGOGASTRODUODENOSCOPY (EGD) WITH PROPOFOL (N/A )  Patient location during evaluation: Phase II Anesthesia Type: General Level of consciousness: awake and alert, awake and oriented Pain management: pain level controlled Vital Signs Assessment: post-procedure vital signs reviewed and stable Respiratory status: spontaneous breathing, nonlabored ventilation and respiratory function stable Cardiovascular status: blood pressure returned to baseline and stable Postop Assessment: no apparent nausea or vomiting Anesthetic complications: no   No complications documented.   Last Vitals:  Vitals:   12/11/20 0811 12/11/20 0821  BP: (!) 151/70 (!) 162/65  Pulse: 63 63  Resp: 12 18  Temp:    SpO2: 100% 99%    Last Pain:  Vitals:   12/11/20 0821  TempSrc:   PainSc: 0-No pain                 Phill Mutter

## 2020-12-12 ENCOUNTER — Encounter: Payer: Self-pay | Admitting: Gastroenterology

## 2020-12-13 LAB — SURGICAL PATHOLOGY

## 2020-12-14 ENCOUNTER — Emergency Department
Admission: EM | Admit: 2020-12-14 | Discharge: 2020-12-14 | Disposition: A | Discharge status: Home / Self Care | Payer: Medicare HMO | Attending: Emergency Medicine | Admitting: Emergency Medicine

## 2020-12-14 ENCOUNTER — Other Ambulatory Visit: Payer: Self-pay

## 2020-12-14 ENCOUNTER — Emergency Department: Payer: Medicare HMO

## 2020-12-14 DIAGNOSIS — Y9289 Other specified places as the place of occurrence of the external cause: Secondary | ICD-10-CM | POA: Insufficient documentation

## 2020-12-14 DIAGNOSIS — M25511 Pain in right shoulder: Secondary | ICD-10-CM | POA: Insufficient documentation

## 2020-12-14 DIAGNOSIS — W01198A Fall on same level from slipping, tripping and stumbling with subsequent striking against other object, initial encounter: Secondary | ICD-10-CM | POA: Diagnosis not present

## 2020-12-14 DIAGNOSIS — Z992 Dependence on renal dialysis: Secondary | ICD-10-CM | POA: Insufficient documentation

## 2020-12-14 DIAGNOSIS — N186 End stage renal disease: Secondary | ICD-10-CM | POA: Insufficient documentation

## 2020-12-14 DIAGNOSIS — I120 Hypertensive chronic kidney disease with stage 5 chronic kidney disease or end stage renal disease: Secondary | ICD-10-CM | POA: Insufficient documentation

## 2020-12-14 DIAGNOSIS — R0789 Other chest pain: Secondary | ICD-10-CM | POA: Diagnosis not present

## 2020-12-14 DIAGNOSIS — M546 Pain in thoracic spine: Secondary | ICD-10-CM | POA: Insufficient documentation

## 2020-12-14 DIAGNOSIS — S0591XA Unspecified injury of right eye and orbit, initial encounter: Secondary | ICD-10-CM | POA: Diagnosis present

## 2020-12-14 DIAGNOSIS — R55 Syncope and collapse: Secondary | ICD-10-CM | POA: Insufficient documentation

## 2020-12-14 DIAGNOSIS — S01111A Laceration without foreign body of right eyelid and periocular area, initial encounter: Secondary | ICD-10-CM | POA: Insufficient documentation

## 2020-12-14 DIAGNOSIS — W19XXXA Unspecified fall, initial encounter: Secondary | ICD-10-CM

## 2020-12-14 LAB — COMPREHENSIVE METABOLIC PANEL
ALT: 14 U/L (ref 0–44)
AST: 20 U/L (ref 15–41)
Albumin: 4.1 g/dL (ref 3.5–5.0)
Alkaline Phosphatase: 71 U/L (ref 38–126)
Anion gap: 11 (ref 5–15)
BUN: 13 mg/dL (ref 8–23)
CO2: 28 mmol/L (ref 22–32)
Calcium: 9.3 mg/dL (ref 8.9–10.3)
Chloride: 99 mmol/L (ref 98–111)
Creatinine, Ser: 3.6 mg/dL — ABNORMAL HIGH (ref 0.44–1.00)
GFR, Estimated: 13 mL/min — ABNORMAL LOW (ref >60)
Glucose, Bld: 123 mg/dL — ABNORMAL HIGH (ref 70–99)
Potassium: 3.8 mmol/L (ref 3.5–5.1)
Sodium: 138 mmol/L (ref 135–145)
Total Bilirubin: 0.9 mg/dL (ref 0.3–1.2)
Total Protein: 7.4 g/dL (ref 6.5–8.1)

## 2020-12-14 LAB — CBC
HCT: 38.7 % (ref 36.0–46.0)
Hemoglobin: 12.6 g/dL (ref 12.0–15.0)
MCH: 33.2 pg (ref 26.0–34.0)
MCHC: 32.6 g/dL (ref 30.0–36.0)
MCV: 102.1 fL — ABNORMAL HIGH (ref 80.0–100.0)
Platelets: 189 10*3/uL (ref 150–400)
RBC: 3.79 MIL/uL — ABNORMAL LOW (ref 3.87–5.11)
RDW: 12.1 % (ref 11.5–15.5)
WBC: 16.9 10*3/uL — ABNORMAL HIGH (ref 4.0–10.5)
nRBC: 0 % (ref 0.0–0.2)

## 2020-12-14 LAB — CBG MONITORING, ED: Glucose-Capillary: 127 mg/dL — ABNORMAL HIGH (ref 70–99)

## 2020-12-14 MED ORDER — ONDANSETRON HCL 4 MG/2ML IJ SOLN: 4.0000 mg | Freq: Once | INTRAMUSCULAR | Status: AC

## 2020-12-14 MED ORDER — FENTANYL CITRATE (PF) 100 MCG/2ML IJ SOLN
100.0000 ug | Freq: Once | INTRAMUSCULAR | Status: AC
  Administered 2020-12-14: 100 ug via INTRAVENOUS
  Filled 2020-12-14: qty 2

## 2020-12-14 MED ORDER — ONDANSETRON HCL 4 MG/2ML IJ SOLN
INTRAMUSCULAR | Status: AC
  Administered 2020-12-14: 4 mg via INTRAVENOUS
  Filled 2020-12-14: qty 2

## 2020-12-14 NOTE — ED Provider Notes (Signed)
[facility] Emergency Department Provider Note  Time seen: 11:54 AM  I have reviewed the triage vital signs and the nursing notes.   HISTORY  Chief Complaint Loss of Consciousness   HPI Joyce [name] is a 75 y.o. female with a past medical history of bipolar, gastric reflux, hypertension, ESRD on HD presents to the emergency department for syncopal episode after dialysis.  According to the patient after dialysis this to the patient up to weigh her she became lightheaded and fell.  Patient states this is the fifth time this is happened where she has passed out after dialysis.  However this time patient fell hitting her head and her right chest/back.  Patient is complaining of moderate pain mostly in the right upper back somewhat to the right lateral chest and right shoulder.  Patient has a small laceration above her right eye.  Hemostatic.   Past Medical History:  Diagnosis Date  . Anal fissure   . Bipolar affective disorder ([place])   . CKD (chronic kidney disease)    Dr Holley Raring [name] 4  . Colon polyps   . Diverticulitis   . Diverticulitis   . Family history of adverse reaction to anesthesia    mom - PONV  . GERD (gastroesophageal reflux disease)   . Headache    migraines - none over 10 yrs  . Heart murmur   . History of hiatal hernia   . Hypertension   . Pancreatitis    Valproic acid  . Vertigo     Patient Active Problem List   Diagnosis Date Noted  . Gastritis without bleeding   . Renal hemorrhage, left 05/23/2019  . Renal cyst, native, hemorrhage 05/22/2019  . RLS (restless legs syndrome) 03/23/2019  . Insomnia due to medical condition 03/23/2019  . Gastro-esophageal reflux disease without esophagitis 06/29/2018  . Bipolar disorder, current episode mixed, mild ([place]) 06/29/2018  . Cardiac murmur, unspecified 06/29/2018  . Neurocognitive disorder 06/29/2018  . Other infective otitis externa, left ear 06/29/2018  . Vitamin B12 deficiency anemia  06/29/2018  . Suicide attempt ([place]) 03/09/2018  . Acute respiratory failure with hypoxia ([place]) 03/04/2018  . Bipolar I disorder, most recent episode depressed ([place]) 02/26/2018  . UTI (urinary tract infection) 12/16/2017  . Palliative care encounter   . Bipolar affective disorder, current episode depressed with psychotic symptoms ([place]) 09/17/2017  . Bipolar I disorder, most recent episode (or current) manic ([place]) 09/02/2017  . Acute delirium 09/02/2017  . Altered mental status   . Acute encephalopathy 09/01/2017  . ESRD on dialysis ([place]) 04/06/2017  . Complication of vascular access for dialysis 04/06/2017  . Hematuria 12/18/2016  . Polycystic kidney 10/12/2016  . Ruptured cyst of kidney 10/03/2016  . HTN (hypertension) 10/01/2016  . Anemia associated with chronic renal failure 09/24/2016  . Noninfectious diarrhea   . Benign neoplasm of cecum   . Benign neoplasm of ascending colon   . Diarrhea   . Nausea   . Gastric polyp   . Chronic constipation 12/27/2014  . Nausea with vomiting 12/27/2014  . Left sided abdominal pain 12/27/2014  . Personal history of colonic polyps 05/10/2013    Past Surgical History:  Procedure Laterality Date  . A/V FISTULAGRAM Left 04/14/2017   Procedure: A/V Fistulagram;  Surgeon: Katha Cabal, MD;  Location: [place] CV LAB;  Service: Cardiovascular;  Laterality: Left;  . A/V FISTULAGRAM Left 06/09/2017   Procedure: A/V FISTULAGRAM;  Surgeon: Katha Cabal, MD;  Location: [facility] INV[facility] CV  LAB;  Service: Cardiovascular;  Laterality: Left;  . A/V FISTULAGRAM Left 11/02/2017   Procedure: A/V FISTULAGRAM;  Surgeon: Algernon Huxley, MD;  Location: [place] CV LAB;  Service: Cardiovascular;  Laterality: Left;  . A/V FISTULAGRAM Left 12/22/2018   Procedure: A/V FISTULAGRAM;  Surgeon: Katha Cabal, MD;  Location: [place] CV LAB;  Service: Cardiovascular;  Laterality: Left;  . A/V FISTULAGRAM Left 06/12/2020   Procedure: A/V  FISTULAGRAM;  Surgeon: Katha Cabal, MD;  Location: [place] CV LAB;  Service: Cardiovascular;  Laterality: Left;  . A/V SHUNT INTERVENTION N/A 11/02/2017   Procedure: A/V SHUNT INTERVENTION;  Surgeon: Algernon Huxley, MD;  Location: [place] CV LAB;  Service: Cardiovascular;  Laterality: N/A;  . ABDOMINAL HYSTERECTOMY  1990 ?  . AV FISTULA PLACEMENT  4/30  . BREAST EXCISIONAL BIOPSY Left 1994   neg surgical bx  . CHOLECYSTECTOMY  2003  . COLONOSCOPY  2014   Dr. Jamal Collin  . COLONOSCOPY WITH PROPOFOL N/A 09/24/2015   Procedure: COLONOSCOPY WITH random colon byopies.;  Surgeon: Lucilla Lame, MD;  Location: [place];  Service: Endoscopy;  Laterality: N/A;  . ESOPHAGOGASTRODUODENOSCOPY (EGD) WITH PROPOFOL N/A 09/24/2015   Procedure: ESOPHAGOGASTRODUODENOSCOPY (EGD) ;  Surgeon: Lucilla Lame, MD;  Location: [place];  Service: Endoscopy;  Laterality: N/A;  . ESOPHAGOGASTRODUODENOSCOPY (EGD) WITH PROPOFOL N/A 12/11/2020   Procedure: ESOPHAGOGASTRODUODENOSCOPY (EGD) WITH PROPOFOL;  Surgeon: Lucilla Lame, MD;  Location: [facility] ENDOSCOPY;  Service: Endoscopy;  Laterality: N/A;  . EYE SURGERY    . PERIPHERAL VASCULAR CATHETERIZATION N/A 05/29/2015   Procedure: A/V Shuntogram/Fistulagram;  Surgeon: Katha Cabal, MD;  Location: [place] CV LAB;  Service: Cardiovascular;  Laterality: N/A;  . PERIPHERAL VASCULAR CATHETERIZATION N/A 05/29/2015   Procedure: A/V Shunt Intervention;  Surgeon: Katha Cabal, MD;  Location: [place] CV LAB;  Service: Cardiovascular;  Laterality: N/A;  . POLYPECTOMY  09/24/2015   Procedure: POLYPECTOMY INTESTINAL;  Surgeon: Lucilla Lame, MD;  Location: [place];  Service: Endoscopy;;  cecal polyp ascending polyp    Prior to Admission medications   Medication Sig Start Date End Date Taking? Authorizing Provider  acetaminophen (TYLENOL) 500 MG tablet Take 1,000 mg by mouth 2 (two) times daily as needed (pain.).    [provider]  alum & mag hydroxide-simeth (GELUSIL) 200-200-25 MG chewable tablet Chew by mouth every 6 (six) hours as needed for indigestion or heartburn.    [provider]  b complex-vitamin c-folic acid (NEPHRO-VITE) 0.8 MG TABS tablet Take 1 tablet by mouth daily.    [provider]  brimonidine (ALPHAGAN) 0.2 % ophthalmic solution Place 1 drop into both eyes 2 (two) times daily. 10/22/17   Clapacs, [name], MD  calcium acetate (PHOSLO) 667 MG capsule Take 667 mg by mouth 3 (three) times daily with meals.  01/04/19   [provider]  carbamazepine (TEGRETOL) 100 MG chewable tablet Chew 100 mg by mouth 2 (two) times daily. (0600 & 2100)    [provider]  Dexlansoprazole 30 MG capsule Take 30 mg by mouth daily.    [provider]  dicyclomine (BENTYL) 20 MG tablet Take 20 mg by mouth 3 (three) times daily after meals.    [provider]  famotidine (PEPCID) 20 MG tablet Take 40 mg by mouth daily as needed for heartburn or indigestion.    [provider]  fluticasone (FLONASE) 50 MCG/ACT nasal spray Place 1 spray into both nostrils daily.  [provider]  haloperidol (HALDOL) 5 MG tablet Take 2.5 mg by mouth 3 (three) times a week. (Tuesday, Thursday and Saturday)    [provider]  HYDROcodone-acetaminophen (NORCO/VICODIN) 5-325 MG tablet Take 1 tablet by mouth every 4 (four) hours as needed for moderate pain. Patient not taking: Reported on 12/11/2020    [provider]  ipratropium (ATROVENT) 0.03 % nasal spray Place 2 sprays into both nostrils 2 (two) times daily as needed for rhinitis (congestion).    [provider]  lidocaine-prilocaine (EMLA) cream Apply 1 application topically every Monday, Wednesday, and Friday with hemodialysis.     [provider]  loratadine (CLARITIN) 10 MG tablet Take 10 mg by mouth daily at 6 (six) AM.    [provider]  LORazepam (ATIVAN) 0.5 MG  tablet Take 0.5 mg by mouth 3 (three) times daily as needed for anxiety.    [provider]  midodrine (PROAMATINE) 10 MG tablet Take 1 tablet (10 mg total) by mouth every Monday, Wednesday, and Friday with hemodialysis. May take an additional doses as needed for SBP<100. 11/05/20   Agbor-Etang, Aaron Edelman, MD  Netarsudil-Latanoprost (ROCKLATAN) 0.02-0.005 % SOLN Place 1 drop into both eyes at bedtime.     [provider]  ondansetron (ZOFRAN) 4 MG tablet Take 4 mg by mouth every 6 (six) hours as needed for nausea or vomiting.    [provider]  rOPINIRole (REQUIP) 2 MG tablet Take 1 tablet (2 mg total) by mouth daily. 03/18/18   [name], Wardell Honour, MD  simethicone (MYLICON) 852 MG chewable tablet Chew 125 mg by mouth in the morning, at noon, and at bedtime.    [provider]  timolol (TIMOPTIC) 0.5 % ophthalmic solution Place 1 drop into both eyes 2 (two) times daily. 10/22/17   Clapacs, [name], MD  traZODone (DESYREL) 50 MG tablet Take 25 mg by mouth at bedtime.    [provider]  vitamin B-12 (CYANOCOBALAMIN) 1000 MCG tablet Take 1,000 mcg by mouth daily at 6 (six) AM.    [provider]  ziprasidone (GEODON) 20 MG capsule Take 20 mg by mouth at bedtime.    [provider]    Allergies  Allergen Reactions  . Morphine And Related Shortness Of Breath    Pt reports chest pain and difficulty breathing.  . Gabapentin   . Indomethacin Hives  . Pollen Extract Other (See Comments)    Sinus problems and HA    Family History  Problem Relation Age of Onset  . Stroke Father   . Hypertension Father   . Breast cancer Other   . Colon cancer Neg Hx   . Liver disease Neg Hx   . Mental illness Neg Hx     Social History Social History   Tobacco Use  . Smoking status: Never Smoker  . Smokeless tobacco: Never Used  Vaping Use  . Vaping Use: Never used  Substance Use Topics  . Alcohol use: No    Alcohol/week: 0.0 standard drinks  .  Drug use: No    Review of Systems Constitutional: Positive for LOC Cardiovascular: Negative for chest pain. Respiratory: Negative for shortness of breath. Gastrointestinal: Negative for abdominal pain, vomiting  Musculoskeletal: Right chest wall pain.  Right shoulder pain.  Right upper back pain.  Moderate dull pain. Neurological: Minimal headache. All other ROS negative  ____________________________________________   PHYSICAL EXAM:  VITAL SIGNS: ED Triage Vitals  Enc Vitals Group     BP 12/14/20  1127 (!) 152/52     Pulse Rate 12/14/20 1127 63     Resp 12/14/20 1127 18     Temp 12/14/20 1127 97.8 F (36.6 C)     Temp Source 12/14/20 1127 Oral     SpO2 12/14/20 1126 98 %     Weight 12/14/20 1127 147 lb 11.3 oz (67 kg)     Height 12/14/20 1127 5\' 3"  (1.6 m)     Head Circumference --      Peak Flow --      Pain Score 12/14/20 1127 7     Pain Loc --      Pain Edu? --      Excl. in [place]? --    Constitutional: Alert and oriented. Well appearing and in no distress. Eyes: Normal exam ENT      Head: Small 1.5 cm laceration above the right eye/lateral right eyebrow, hemostatic.      Mouth/Throat: Mucous membranes are moist. Cardiovascular: Normal rate, regular rhythm.  Respiratory: Normal respiratory effort without tachypnea nor retractions. Breath sounds are clear  Gastrointestinal: Soft and nontender. No distention. Musculoskeletal: Mild tenderness to palpation of the right shoulder with mild pain with range of motion.  Moderate tenderness of the right lateral and posterior chest. Neurologic:  Normal speech and language. No gross focal neurologic deficits  Skin:  Skin is warm, dry and intact.  Psychiatric: Mood and affect are normal.  ____________________________________________    EKG  EKG viewed and interpreted by myself shows a normal sinus rhythm at 62 bpm with a narrow QRS, normal axis, normal intervals, no concerning ST  changes.  ____________________________________________    RADIOLOGY  CT scans are negative for acute abnormality  ____________________________________________   INITIAL IMPRESSION / ASSESSMENT AND PLAN / ED COURSE  Pertinent labs & imaging results that were available during my care of the patient were reviewed by me and considered in my medical decision making (see chart for details).   Patient presents emergency department for syncopal episode after dialysis.  States this is the fifth time she has passed out after dialysis.  States her blood pressure will drop low and then it will come back up.  Patient did hit her head did fall on her right side.  Is experiencing moderate pain throughout her right chest shoulder and posterior chest.  Also has a small laceration above the right eye, hemostatic and not gaping we will cover with Dermabond.  We will obtain CT imaging of the head neck and chest as a precaution.  Suspect likely rib fracture.  Vital signs are reassuring.  No abdominal tenderness.  CT scans are negative for acute abnormality.  No obvious fracture.  Patient states he is feeling much better.  Lab work is overall reassuring.  Vitals remain reassuring.  I have repaired the laceration with Dermabond.  Patient will be discharged home.  Joyce [name] was evaluated in Emergency Department on 12/14/2020 for the symptoms described in the history of present illness. She was evaluated in the context of the global COVID-19 pandemic, which necessitated consideration that the patient might be at risk for infection with the SARS-CoV-2 virus that causes COVID-19. Institutional protocols and algorithms that pertain to the evaluation of patients at risk for COVID-19 are in a state of rapid change based on information released by regulatory bodies including the CDC and federal and state organizations. These policies and algorithms were followed during the patient's care in the  ED.  ____________________________________________   FINAL CLINICAL IMPRESSION(S) /  ED DIAGNOSES  Fall Syncope   Harvest Dark, MD 12/14/20 1534

## 2020-12-14 NOTE — ED Notes (Signed)
Sister Enid Derry called per pt request.- states she will pick up pt in 20 minutes.

## 2020-12-14 NOTE — ED Triage Notes (Signed)
Per EMS: pt finished dialysis in [place] and passed out while standing to weigh herself. Pt sustained laceration over right eye and c/o right upper back/shoulder pain.  Pt is AOX4, appears uncomfortable. Port is in left upper arm. Pt gets dialysis Monday, Wednesday, and Friday. 2 liter's of fluid was removed today.

## 2020-12-18 ENCOUNTER — Telehealth: Payer: Self-pay

## 2020-12-18 ENCOUNTER — Encounter: Payer: Self-pay | Admitting: Gastroenterology

## 2020-12-18 ENCOUNTER — Other Ambulatory Visit: Payer: Self-pay | Admitting: Gastroenterology

## 2020-12-18 ENCOUNTER — Encounter
Admission: RE | Admit: 2020-12-18 | Discharge: 2020-12-18 | Disposition: A | Discharge status: Home / Self Care | Payer: Medicare HMO | Source: Ambulatory Visit | Attending: Gastroenterology | Admitting: Gastroenterology

## 2020-12-18 ENCOUNTER — Other Ambulatory Visit: Payer: Self-pay

## 2020-12-18 DIAGNOSIS — R112 Nausea with vomiting, unspecified: Secondary | ICD-10-CM | POA: Insufficient documentation

## 2020-12-18 MED ORDER — TECHNETIUM TC 99M MEBROFENIN IV KIT
4.9300 | PACK | Freq: Once | INTRAVENOUS | Status: AC | PRN
  Administered 2020-12-18: 4.93 via INTRAVENOUS

## 2020-12-18 NOTE — Telephone Encounter (Signed)
Pt notified of US results through mychart.  

## 2020-12-18 NOTE — Telephone Encounter (Signed)
-----   Message from Lucilla Lame, MD sent at 12/18/2020 11:23 AM EDT ----- Let the patient know that her right upper quadrant ultrasound was unremarkable.

## 2020-12-21 ENCOUNTER — Emergency Department: Payer: Medicare HMO

## 2020-12-21 ENCOUNTER — Other Ambulatory Visit: Payer: Self-pay

## 2020-12-21 ENCOUNTER — Emergency Department
Admission: EM | Admit: 2020-12-21 | Discharge: 2020-12-21 | Disposition: A | Discharge status: Home / Self Care | Payer: Medicare HMO | Attending: Emergency Medicine | Admitting: Emergency Medicine

## 2020-12-21 DIAGNOSIS — M79621 Pain in right upper arm: Secondary | ICD-10-CM | POA: Insufficient documentation

## 2020-12-21 DIAGNOSIS — Z85038 Personal history of other malignant neoplasm of large intestine: Secondary | ICD-10-CM | POA: Diagnosis not present

## 2020-12-21 DIAGNOSIS — M25521 Pain in right elbow: Secondary | ICD-10-CM | POA: Insufficient documentation

## 2020-12-21 DIAGNOSIS — N186 End stage renal disease: Secondary | ICD-10-CM | POA: Insufficient documentation

## 2020-12-21 DIAGNOSIS — M25511 Pain in right shoulder: Secondary | ICD-10-CM | POA: Diagnosis not present

## 2020-12-21 DIAGNOSIS — Z992 Dependence on renal dialysis: Secondary | ICD-10-CM | POA: Diagnosis not present

## 2020-12-21 DIAGNOSIS — I120 Hypertensive chronic kidney disease with stage 5 chronic kidney disease or end stage renal disease: Secondary | ICD-10-CM | POA: Insufficient documentation

## 2020-12-21 NOTE — ED Notes (Signed)
Patient reports fall last week. Patient reports no improvement to pain since fall last week. Patient reports pain to RUE, RLE, and right elbow. Patient also reports "bruises won't go away." Patient denies blood thinner, or LOC.

## 2020-12-21 NOTE — ED Provider Notes (Signed)
[facility]-EMERGENCY DEPARTMENT  ____________________________________________  Time seen: Approximately 6:27 PM  I have reviewed the triage vital signs and the nursing notes.   HISTORY  Chief Complaint Fall   Historian Patient     HPI Joyce [name] is a 74 y.o. female with a history of bipolar, CKD, diverticulitis and recent fall, presents to the emergency department with persistent right upper extremity discomfort.  Patient reports that she has pain along the right elbow and right shoulder.  Patient states that she has still been able to move her right arm and is observed eating a peanut butter and jelly sandwich in the emergency department.  No weakness of the right upper extremity.  She denies chest pain, chest tightness or abdominal pain.  Most recent fall was 12/14/20.   Past Medical History:  Diagnosis Date  . Anal fissure   . Bipolar affective disorder ([place])   . CKD (chronic kidney disease)    Dr Holley Raring [name] 4  . Colon polyps   . Diverticulitis   . Diverticulitis   . Family history of adverse reaction to anesthesia    mom - PONV  . GERD (gastroesophageal reflux disease)   . Headache    migraines - none over 10 yrs  . Heart murmur   . History of hiatal hernia   . Hypertension   . Pancreatitis    Valproic acid  . Vertigo      Immunizations up to date:  Yes.     Past Medical History:  Diagnosis Date  . Anal fissure   . Bipolar affective disorder ([place])   . CKD (chronic kidney disease)    Dr Holley Raring [name] 4  . Colon polyps   . Diverticulitis   . Diverticulitis   . Family history of adverse reaction to anesthesia    mom - PONV  . GERD (gastroesophageal reflux disease)   . Headache    migraines - none over 10 yrs  . Heart murmur   . History of hiatal hernia   . Hypertension   . Pancreatitis    Valproic acid  . Vertigo     Patient Active Problem List   Diagnosis Date Noted  . Gastritis without bleeding   . Renal hemorrhage, left 05/23/2019   . Renal cyst, native, hemorrhage 05/22/2019  . RLS (restless legs syndrome) 03/23/2019  . Insomnia due to medical condition 03/23/2019  . Gastro-esophageal reflux disease without esophagitis 06/29/2018  . Bipolar disorder, current episode mixed, mild ([place]) 06/29/2018  . Cardiac murmur, unspecified 06/29/2018  . Neurocognitive disorder 06/29/2018  . Other infective otitis externa, left ear 06/29/2018  . Vitamin B12 deficiency anemia 06/29/2018  . Suicide attempt ([place]) 03/09/2018  . Acute respiratory failure with hypoxia ([place]) 03/04/2018  . Bipolar I disorder, most recent episode depressed ([place]) 02/26/2018  . UTI (urinary tract infection) 12/16/2017  . Palliative care encounter   . Bipolar affective disorder, current episode depressed with psychotic symptoms ([place]) 09/17/2017  . Bipolar I disorder, most recent episode (or current) manic ([place]) 09/02/2017  . Acute delirium 09/02/2017  . Altered mental status   . Acute encephalopathy 09/01/2017  . ESRD on dialysis ([place]) 04/06/2017  . Complication of vascular access for dialysis 04/06/2017  . Hematuria 12/18/2016  . Polycystic kidney 10/12/2016  . Ruptured cyst of kidney 10/03/2016  . HTN (hypertension) 10/01/2016  . Anemia associated with chronic renal failure 09/24/2016  . Noninfectious diarrhea   . Benign neoplasm of cecum   . Benign neoplasm of ascending colon   .  Diarrhea   . Nausea   . Gastric polyp   . Chronic constipation 12/27/2014  . Nausea with vomiting 12/27/2014  . Left sided abdominal pain 12/27/2014  . Personal history of colonic polyps 05/10/2013    Past Surgical History:  Procedure Laterality Date  . A/V FISTULAGRAM Left 04/14/2017   Procedure: A/V Fistulagram;  Surgeon: Katha Cabal, MD;  Location: [place] [facility];  Service: Cardiovascular;  Laterality: Left;  . A/V FISTULAGRAM Left 06/09/2017   Procedure: A/V FISTULAGRAM;  Surgeon: Katha Cabal, MD;  Location: [place] [facility];  Service:  Cardiovascular;  Laterality: Left;  . A/V FISTULAGRAM Left 11/02/2017   Procedure: A/V FISTULAGRAM;  Surgeon: Algernon Huxley, MD;  Location: [place] [facility];  Service: Cardiovascular;  Laterality: Left;  . A/V FISTULAGRAM Left 12/22/2018   Procedure: A/V FISTULAGRAM;  Surgeon: Katha Cabal, MD;  Location: [place] [facility];  Service: Cardiovascular;  Laterality: Left;  . A/V FISTULAGRAM Left 06/12/2020   Procedure: A/V FISTULAGRAM;  Surgeon: Katha Cabal, MD;  Location: [place] [facility];  Service: Cardiovascular;  Laterality: Left;  . A/V SHUNT INTERVENTION N/A 11/02/2017   Procedure: A/V SHUNT INTERVENTION;  Surgeon: Algernon Huxley, MD;  Location: [place] [facility];  Service: Cardiovascular;  Laterality: N/A;  . ABDOMINAL HYSTERECTOMY  1990 ?  . AV FISTULA PLACEMENT  4/30  . BREAST EXCISIONAL BIOPSY Left 1994   neg surgical bx  . CHOLECYSTECTOMY  2003  . COLONOSCOPY  2014   Dr. Jamal Collin  . COLONOSCOPY WITH PROPOFOL N/A 09/24/2015   Procedure: COLONOSCOPY WITH random colon byopies.;  Surgeon: Lucilla Lame, MD;  Location: [place];  Service: Endoscopy;  Laterality: N/A;  . ESOPHAGOGASTRODUODENOSCOPY (EGD) WITH PROPOFOL N/A 09/24/2015   Procedure: ESOPHAGOGASTRODUODENOSCOPY (EGD) ;  Surgeon: Lucilla Lame, MD;  Location: [place];  Service: Endoscopy;  Laterality: N/A;  . ESOPHAGOGASTRODUODENOSCOPY (EGD) WITH PROPOFOL N/A 12/11/2020   Procedure: ESOPHAGOGASTRODUODENOSCOPY (EGD) WITH PROPOFOL;  Surgeon: Lucilla Lame, MD;  Location: [facility];  Service: Endoscopy;  Laterality: N/A;  . EYE SURGERY    . PERIPHERAL VASCULAR CATHETERIZATION N/A 05/29/2015   Procedure: A/V Shuntogram/Fistulagram;  Surgeon: Katha Cabal, MD;  Location: [place] [facility];  Service: Cardiovascular;  Laterality: N/A;  . PERIPHERAL VASCULAR CATHETERIZATION N/A 05/29/2015   Procedure: A/V Shunt Intervention;  Surgeon: Katha Cabal, MD;  Location: [place] [facility];   Service: Cardiovascular;  Laterality: N/A;  . POLYPECTOMY  09/24/2015   Procedure: POLYPECTOMY INTESTINAL;  Surgeon: Lucilla Lame, MD;  Location: [place];  Service: Endoscopy;;  cecal polyp ascending polyp    Prior to Admission medications   Medication Sig Start Date End Date Taking? Authorizing Provider  acetaminophen (TYLENOL) 500 MG tablet Take 1,000 mg by mouth 2 (two) times daily as needed (pain.).    [provider]  alum & mag hydroxide-simeth (GELUSIL) 200-200-25 MG chewable tablet Chew by mouth every 6 (six) hours as needed for indigestion or heartburn.    [provider]  b complex-vitamin c-folic acid (NEPHRO-VITE) 0.8 MG TABS tablet Take 1 tablet by mouth daily.    [provider]  brimonidine (ALPHAGAN) 0.2 % ophthalmic solution Place 1 drop into both eyes 2 (two) times daily. 10/22/17   Clapacs, [name], MD  calcium acetate (PHOSLO) 667 MG capsule Take 667 mg by mouth 3 (three) times daily with meals.  01/04/19   [provider]  carbamazepine (TEGRETOL) 100 MG chewable tablet  Chew 100 mg by mouth 2 (two) times daily. (0600 & 2100)    [provider]  Dexlansoprazole 30 MG capsule Take 30 mg by mouth daily.    [provider]  dicyclomine (BENTYL) 20 MG tablet Take 20 mg by mouth 3 (three) times daily after meals.    [provider]  famotidine (PEPCID) 20 MG tablet Take 40 mg by mouth daily as needed for heartburn or indigestion.    [provider]  fluticasone (FLONASE) 50 MCG/ACT nasal spray Place 1 spray into both nostrils daily.     [provider]  haloperidol (HALDOL) 5 MG tablet Take 2.5 mg by mouth 3 (three) times a week. (Tuesday, Thursday and Saturday)    [provider]  HYDROcodone-acetaminophen (NORCO/VICODIN) 5-325 MG tablet Take 1 tablet by mouth every 4 (four) hours as needed for moderate pain. Patient not taking: Reported on 12/11/2020    [provider]   ipratropium (ATROVENT) 0.03 % nasal spray Place 2 sprays into both nostrils 2 (two) times daily as needed for rhinitis (congestion).    [provider]  lidocaine-prilocaine (EMLA) cream Apply 1 application topically every Monday, Wednesday, and Friday with hemodialysis.     [provider]  loratadine (CLARITIN) 10 MG tablet Take 10 mg by mouth daily at 6 (six) AM.    [provider]  LORazepam (ATIVAN) 0.5 MG tablet Take 0.5 mg by mouth 3 (three) times daily as needed for anxiety.    [provider]  midodrine (PROAMATINE) 10 MG tablet Take 1 tablet (10 mg total) by mouth every Monday, Wednesday, and Friday with hemodialysis. May take an additional doses as needed for SBP<100. 11/05/20   Agbor-Etang, Aaron Edelman, MD  Netarsudil-Latanoprost (ROCKLATAN) 0.02-0.005 % SOLN Place 1 drop into both eyes at bedtime.     [provider]  ondansetron (ZOFRAN) 4 MG tablet Take 4 mg by mouth every 6 (six) hours as needed for nausea or vomiting.    [provider]  rOPINIRole (REQUIP) 2 MG tablet Take 1 tablet (2 mg total) by mouth daily. 03/18/18   [name], Wardell Honour, MD  simethicone (MYLICON) 086 MG chewable tablet Chew 125 mg by mouth in the morning, at noon, and at bedtime.    [provider]  timolol (TIMOPTIC) 0.5 % ophthalmic solution Place 1 drop into both eyes 2 (two) times daily. 10/22/17   Clapacs, [name], MD  traZODone (DESYREL) 50 MG tablet Take 25 mg by mouth at bedtime.    [provider]  vitamin B-12 (CYANOCOBALAMIN) 1000 MCG tablet Take 1,000 mcg by mouth daily at 6 (six) AM.    [provider]  ziprasidone (GEODON) 20 MG capsule Take 20 mg by mouth at bedtime.    [provider]    Allergies Morphine and related, Gabapentin, Indomethacin, and Pollen extract  Family History  Problem Relation Age of Onset  . Stroke Father   . Hypertension Father   . Breast cancer Other   . Colon cancer Neg Hx   . Liver  disease Neg Hx   . Mental illness Neg Hx     Social History Social History   Tobacco Use  . Smoking status: Never Smoker  . Smokeless tobacco: Never Used  Vaping Use  . Vaping Use: Never used  Substance Use Topics  . Alcohol use: No    Alcohol/week: 0.0 standard drinks  . Drug use: No     Review of Systems  Constitutional: No fever/chills Eyes:  No discharge ENT: No upper respiratory complaints. Respiratory: no cough. No SOB/ use of accessory muscles to breath Gastrointestinal:   No nausea, no vomiting.  No diarrhea.  No constipation. Musculoskeletal: Patient has right arm pain.  Skin: Negative for rash, abrasions, lacerations, ecchymosis.  ____________________________________________   PHYSICAL EXAM:  VITAL SIGNS: ED Triage Vitals  Enc Vitals Group     BP 12/21/20 1712 (!) 131/50     Pulse Rate 12/21/20 1712 98     Resp 12/21/20 1712 16     Temp 12/21/20 1713 97.6 F (36.4 C)     Temp Source 12/21/20 1713 Oral     SpO2 12/21/20 1712 100 %     Weight 12/21/20 1713 139 lb (63 kg)     Height 12/21/20 1713 5\' 3"  (1.6 m)     Head Circumference --      Peak Flow --      Pain Score 12/21/20 1713 10     Pain Loc --      Pain Edu? --      Excl. in [place]? --      Constitutional: Alert and oriented. Well appearing and in no acute distress. Eyes: Conjunctivae are normal. PERRL. EOMI. Head: Atraumatic. ENT:  Cardiovascular: Normal rate, regular rhythm. Normal S1 and S2.  Good peripheral circulation. Respiratory: Normal respiratory effort without tachypnea or retractions. Lungs CTAB. Good air entry to the bases with no decreased or absent breath sounds Gastrointestinal: Bowel sounds x 4 quadrants. Soft and nontender to palpation. No guarding or rigidity. No distention. Musculoskeletal: Full range of motion to all extremities. No obvious deformities noted Neurologic:  Normal for age. No gross focal neurologic deficits are appreciated.  Skin:  Skin is warm, dry and  intact. No rash noted. Psychiatric: Mood and affect are normal for age. Speech and behavior are normal.   ____________________________________________   LABS (all labs ordered are listed, but only abnormal results are displayed)  Labs Reviewed - No data to display ____________________________________________  EKG   ____________________________________________  RADIOLOGY Unk Pinto, personally viewed and evaluated these images (plain radiographs) as part of my medical decision making, as well as reviewing the written report by the radiologist.  DG Humerus Right  Result Date: 12/21/2020 CLINICAL DATA:  Right arm pain, fall EXAM: RIGHT HUMERUS - 2+ VIEW COMPARISON:  None. FINDINGS: There is no evidence of fracture or other focal bone lesions. Soft tissues are unremarkable. IMPRESSION: No fracture or dislocation of the right humerus. Electronically Signed   By: Eddie Candle M.D.   On: 12/21/2020 17:56    ____________________________________________    PROCEDURES  Procedure(s) performed:     Procedures     Medications - No data to display   ____________________________________________   INITIAL IMPRESSION / ASSESSMENT AND PLAN / ED COURSE  Pertinent labs & imaging results that were available during my care of the patient were reviewed by me and considered in my medical decision making (see chart for details).      Assessment and plan Fall 75 year old female presents to the emergency department with right upper extremity pain after a mechanical fall on 527.  Dedicated x-rays of the right humerus showed no bony abnormality and patient had reassuring range of motion at bedside and was observed eating a peanut butter and jelly sandwich that she had packed from home.  Recommended continuing Tylenol at home for discomfort.  Return precautions were given to return with new or worsening symptoms.     ____________________________________________  FINAL CLINICAL  IMPRESSION(S) / ED DIAGNOSES  Final diagnoses:  Pain in right upper arm      NEW MEDICATIONS STARTED DURING THIS VISIT:  ED Discharge Orders    None          This chart was dictated using voice recognition software/Dragon. Despite best efforts to proofread, errors can occur which can change the meaning. Any change was purely unintentional.     Lannie Fields, PA-C 12/21/20 1831    Nance Pear, MD 12/21/20 Sharilyn Sites

## 2020-12-21 NOTE — ED Triage Notes (Signed)
Pt in via EMS from [facility] assisted living. Pt fell last week and has a possible right arm hairline fx but the facility reported they had no paperwork or anything where she was seen here.  Pt continues to c/o pain from right shoulder to elbow.

## 2020-12-21 NOTE — ED Triage Notes (Signed)
See first nurse note- Pt reports falling last week and reports being "all bruised up" States she was seen here at the ER after the incident. Pt reports continued right shoulder pain, right upper arm pain, and right elbow pain. Pt also with bruising to right eye.

## 2020-12-21 NOTE — Discharge Instructions (Addendum)
Continue taking Tylenol for pain.

## 2020-12-21 NOTE — ED Notes (Signed)
Patient discharged by Karma Ganja, RN.

## 2020-12-26 ENCOUNTER — Other Ambulatory Visit (INDEPENDENT_AMBULATORY_CARE_PROVIDER_SITE_OTHER): Payer: Self-pay | Admitting: Nurse Practitioner

## 2020-12-26 DIAGNOSIS — N186 End stage renal disease: Secondary | ICD-10-CM

## 2020-12-27 ENCOUNTER — Other Ambulatory Visit: Payer: Self-pay

## 2020-12-27 ENCOUNTER — Ambulatory Visit (INDEPENDENT_AMBULATORY_CARE_PROVIDER_SITE_OTHER): Payer: Medicare HMO | Admitting: Vascular Surgery

## 2020-12-27 ENCOUNTER — Ambulatory Visit (INDEPENDENT_AMBULATORY_CARE_PROVIDER_SITE_OTHER): Payer: Medicare HMO

## 2020-12-27 VITALS — BP 152/70 | HR 60 | Ht 63.0 in | Wt 138.0 lb

## 2020-12-27 DIAGNOSIS — K219 Gastro-esophageal reflux disease without esophagitis: Secondary | ICD-10-CM | POA: Diagnosis not present

## 2020-12-27 DIAGNOSIS — I10 Essential (primary) hypertension: Secondary | ICD-10-CM

## 2020-12-27 DIAGNOSIS — Z992 Dependence on renal dialysis: Secondary | ICD-10-CM

## 2020-12-27 DIAGNOSIS — N186 End stage renal disease: Secondary | ICD-10-CM

## 2020-12-27 DIAGNOSIS — T829XXS Unspecified complication of cardiac and vascular prosthetic device, implant and graft, sequela: Secondary | ICD-10-CM

## 2020-12-28 ENCOUNTER — Telehealth (INDEPENDENT_AMBULATORY_CARE_PROVIDER_SITE_OTHER): Payer: Self-pay

## 2020-12-28 ENCOUNTER — Telehealth (INDEPENDENT_AMBULATORY_CARE_PROVIDER_SITE_OTHER): Payer: Self-pay | Admitting: Vascular Surgery

## 2020-12-28 NOTE — Telephone Encounter (Signed)
Joyce [name] with [facility] states that she wanted to schedule pt for procedure. I explained that Mickel Baas had already spoken with Dorrell at [facility]. Kim doesn't understand why we spoke with her. Does not know if they will be able to have her to the [facility] on Monday. States that she will try to find someone else to take her and call us back if not. I explained that she needs to get with Correll to make sure of pre-procedure instructions that may/may not include a COVID test. She acknowledged. Nothing further needed at this time.

## 2020-12-28 NOTE — Telephone Encounter (Signed)
Spoke with Correll at [facility] and the patient is scheduled with Dr. Delana Meyer for a left arm fistulagram on 01/08/21 with a 1:00 pm arrival time to the [facility]. Pre-procedure instructions were discussed and will  be faxed to attention Correll.

## 2020-12-30 ENCOUNTER — Encounter (INDEPENDENT_AMBULATORY_CARE_PROVIDER_SITE_OTHER): Payer: Self-pay | Admitting: Vascular Surgery

## 2020-12-30 NOTE — H&P (View-Only) (Signed)
MRN : [id number]  Joyce [name] is a 75 y.o. (02-09-1946) female who presents with chief complaint of  Chief Complaint  Patient presents with   Follow-up    6 Mo U/S   .  History of Present Illness:   The patient returns to the office for follow up regarding problem with the dialysis access. Currently the patient is maintained via a left arm brachial cephalic fistula.  The patient has had multiple failed upper extremity accesses.  The patient notes a significant increase in bleeding time after decannulation.  The patient has also been informed that there is increased recirculation.    The patient denies hand pain or other symptoms consistent with steal phenomena.  No significant arm swelling.  The patient denies redness or swelling at the access site. The patient denies fever or chills at home or while on dialysis.  The patient denies amaurosis fugax or recent TIA symptoms. There are no recent neurological changes noted. The patient denies claudication symptoms or rest pain symptoms. The patient denies history of DVT, PE or superficial thrombophlebitis. The patient denies recent episodes of angina or shortness of breath.   Duplex ultrasound of the AV access shows a patent access.  The previously noted stenosis is increased compared to last study.      Current Meds  Medication Sig   acetaminophen (TYLENOL) 500 MG tablet Take 1,000 mg by mouth 2 (two) times daily as needed (pain.).   alum & mag hydroxide-simeth (GELUSIL) [phone] MG chewable tablet Chew by mouth every 6 (six) hours as needed for indigestion or heartburn.   b complex-vitamin c-folic acid (NEPHRO-VITE) 0.8 MG TABS tablet Take 1 tablet by mouth daily.   bacitracin-polymyxin b (POLYSPORIN) ophthalmic ointment    brimonidine (ALPHAGAN) 0.15 % ophthalmic solution SMARTSIG:In Eye(s)   brimonidine (ALPHAGAN) 0.2 % ophthalmic solution Place 1 drop into both eyes 2 (two) times daily.   calcium acetate (PHOSLO) 667 MG  capsule Take 667 mg by mouth 3 (three) times daily with meals.    carbamazepine (TEGRETOL) 100 MG chewable tablet Chew 100 mg by mouth 2 (two) times daily. (0600 & 2100)   Dexlansoprazole 30 MG capsule Take 30 mg by mouth daily.   diclofenac Sodium (VOLTAREN) 1 % GEL Apply 1 application topically 4 (four) times daily.   dicyclomine (BENTYL) 20 MG tablet Take 20 mg by mouth 3 (three) times daily after meals.   famotidine (PEPCID) 20 MG tablet Take 40 mg by mouth daily as needed for heartburn or indigestion.   famotidine (PEPCID) 40 MG tablet Take 40 mg by mouth daily.   fluticasone (FLONASE) 50 MCG/ACT nasal spray Place 1 spray into both nostrils daily.    haloperidol (HALDOL) 5 MG tablet Take 2.5 mg by mouth 3 (three) times a week. (Tuesday, Thursday and Saturday)   HYDROcodone-acetaminophen (NORCO/VICODIN) 5-325 MG tablet Take 1 tablet by mouth every 4 (four) hours as needed for moderate pain.   ipratropium (ATROVENT) 0.03 % nasal spray Place 2 sprays into both nostrils 2 (two) times daily as needed for rhinitis (congestion).   lidocaine-prilocaine (EMLA) cream Apply 1 application topically every Monday, Wednesday, and Friday with hemodialysis.    loratadine (CLARITIN) 10 MG tablet Take 10 mg by mouth daily at 6 (six) AM.   LORazepam (ATIVAN) 0.5 MG tablet Take 0.5 mg by mouth 3 (three) times daily as needed for anxiety.   midodrine (PROAMATINE) 10 MG tablet Take 1 tablet (10 mg total) by mouth every Monday, Wednesday, and  Friday with hemodialysis. May take an additional doses as needed for SBP<100.   Netarsudil-Latanoprost (ROCKLATAN) 0.02-0.005 % SOLN Place 1 drop into both eyes at bedtime.    ondansetron (ZOFRAN) 4 MG tablet Take 4 mg by mouth every 6 (six) hours as needed for nausea or vomiting.   rOPINIRole (REQUIP) 2 MG tablet Take 1 tablet (2 mg total) by mouth daily.   simethicone (MYLICON) 702 MG chewable tablet Chew 125 mg by mouth in the morning, at noon, and at bedtime.   timolol  (TIMOPTIC) 0.5 % ophthalmic solution Place 1 drop into both eyes 2 (two) times daily.   traZODone (DESYREL) 50 MG tablet Take 25 mg by mouth at bedtime.   vitamin B-12 (CYANOCOBALAMIN) 1000 MCG tablet Take 1,000 mcg by mouth daily at 6 (six) AM.   ziprasidone (GEODON) 20 MG capsule Take 20 mg by mouth at bedtime.    Past Medical History:  Diagnosis Date   Anal fissure    Bipolar affective disorder ([place])    CKD (chronic kidney disease)    Dr Holley Raring [name] 4   Colon polyps    Diverticulitis    Diverticulitis    Family history of adverse reaction to anesthesia    mom - PONV   GERD (gastroesophageal reflux disease)    Headache    migraines - none over 10 yrs   Heart murmur    History of hiatal hernia    Hypertension    Pancreatitis    Valproic acid   Vertigo     Past Surgical History:  Procedure Laterality Date   A/V FISTULAGRAM Left 04/14/2017   Procedure: A/V Fistulagram;  Surgeon: Katha Cabal, MD;  Location: [place] [facility];  Service: Cardiovascular;  Laterality: Left;   A/V FISTULAGRAM Left 06/09/2017   Procedure: A/V FISTULAGRAM;  Surgeon: Katha Cabal, MD;  Location: [place] [facility];  Service: Cardiovascular;  Laterality: Left;   A/V FISTULAGRAM Left 11/02/2017   Procedure: A/V FISTULAGRAM;  Surgeon: Algernon Huxley, MD;  Location: [place] [facility];  Service: Cardiovascular;  Laterality: Left;   A/V FISTULAGRAM Left 12/22/2018   Procedure: A/V FISTULAGRAM;  Surgeon: Katha Cabal, MD;  Location: [place] [facility];  Service: Cardiovascular;  Laterality: Left;   A/V FISTULAGRAM Left 06/12/2020   Procedure: A/V FISTULAGRAM;  Surgeon: Katha Cabal, MD;  Location: [place] [facility];  Service: Cardiovascular;  Laterality: Left;   A/V SHUNT INTERVENTION N/A 11/02/2017   Procedure: A/V SHUNT INTERVENTION;  Surgeon: Algernon Huxley, MD;  Location: [place] [facility];  Service: Cardiovascular;  Laterality: N/A;   ABDOMINAL HYSTERECTOMY  1990 ?    AV FISTULA PLACEMENT  4/30   BREAST EXCISIONAL BIOPSY Left 1994   neg surgical bx   CHOLECYSTECTOMY  2003   COLONOSCOPY  2014   Dr. Jamal Collin   COLONOSCOPY WITH PROPOFOL N/A 09/24/2015   Procedure: COLONOSCOPY WITH random colon byopies.;  Surgeon: Lucilla Lame, MD;  Location: [place];  Service: Endoscopy;  Laterality: N/A;   ESOPHAGOGASTRODUODENOSCOPY (EGD) WITH PROPOFOL N/A 09/24/2015   Procedure: ESOPHAGOGASTRODUODENOSCOPY (EGD) ;  Surgeon: Lucilla Lame, MD;  Location: [place];  Service: Endoscopy;  Laterality: N/A;   ESOPHAGOGASTRODUODENOSCOPY (EGD) WITH PROPOFOL N/A 12/11/2020   Procedure: ESOPHAGOGASTRODUODENOSCOPY (EGD) WITH PROPOFOL;  Surgeon: Lucilla Lame, MD;  Location[facility];  Service: Endoscopy;  Laterality: N/A;   EYE SURGERY     PERIPHERAL VASCULAR CATHETERIZATION N/A 05/29/2015   Procedure: A/V Shuntogram/Fistulagram;  Surgeon: Belenda Cruise  Eloise Levels, MD;  Location: [place] [facility];  Service: Cardiovascular;  Laterality: N/A;   PERIPHERAL VASCULAR CATHETERIZATION N/A 05/29/2015   Procedure: A/V Shunt Intervention;  Surgeon: Katha Cabal, MD;  Location: [place] [facility];  Service: Cardiovascular;  Laterality: N/A;   POLYPECTOMY  09/24/2015   Procedure: POLYPECTOMY INTESTINAL;  Surgeon: Lucilla Lame, MD;  Location: [place];  Service: Endoscopy;;  cecal polyp ascending polyp    Social History Social History   Tobacco Use   Smoking status: Never   Smokeless tobacco: Never  Vaping Use   Vaping Use: Never used  Substance Use Topics   Alcohol use: No    Alcohol/week: 0.0 standard drinks   Drug use: No    Family History Family History  Problem Relation Age of Onset   Stroke Father    Hypertension Father    Breast cancer Other    Colon cancer Neg Hx    Liver disease Neg Hx    Mental illness Neg Hx     Allergies  Allergen Reactions   Morphine And Related Shortness Of Breath    Pt reports chest pain and difficulty breathing.    Gabapentin    Indomethacin Hives   Pollen Extract Other (See Comments)    Sinus problems and HA     REVIEW OF SYSTEMS (Negative unless checked)  Constitutional: [] Weight loss  [] Fever  [] Chills Cardiac: [] Chest pain   [] Chest pressure   [] Palpitations   [] Shortness of breath when laying flat   [] Shortness of breath with exertion. Vascular:  [] Pain in legs with walking   [] Pain in legs at rest  [] History of DVT   [] Phlebitis   [x] Swelling in legs   [] Varicose veins   [] Non-healing ulcers Pulmonary:   [] Uses home oxygen   [] Productive cough   [] Hemoptysis   [] Wheeze  [] COPD   [] Asthma Neurologic:  [] Dizziness   [] Seizures   [] History of stroke   [] History of TIA  [] Aphasia   [] Vissual changes   [] Weakness or numbness in arm   [] Weakness or numbness in leg Musculoskeletal:   [] Joint swelling   [x] Joint pain   [] Low back pain Hematologic:  [] Easy bruising  [] Easy bleeding   [] Hypercoagulable state   [] Anemic Gastrointestinal:  [] Diarrhea   [] Vomiting  [x] Gastroesophageal reflux/heartburn   [] Difficulty swallowing. Genitourinary:  [] Chronic kidney disease   [] Difficult urination  [] Frequent urination   [] Blood in urine Skin:  [] Rashes   [] Ulcers  Psychological:  [x] History of anxiety   []  History of major depression.  Physical Examination  Vitals:   12/27/20 1125  BP: (!) 152/70  Pulse: 60  Weight: 138 lb (62.6 kg)  Height: 5\' 3"  (1.6 m)   Body mass index is 24.45 kg/m. Gen: WD/WN, NAD Head: [place]/AT, No temporalis wasting.  Ear/Nose/Throat: Hearing grossly intact, nares w/o erythema or drainage Eyes: PER, EOMI, sclera nonicteric.  Neck: Supple, no large masses.   Pulmonary:  Good air movement, no audible wheezing bilaterally, no use of accessory muscles.  Cardiac: RRR, no JVD Vascular:  left brachial cephalic fistula markedly pulsatile  Vessel Right Left  Radial Palpable Palpable  Brachial Palpable Palpable  Gastrointestinal: Non-distended. No guarding/no peritoneal signs.   Musculoskeletal: M/S 5/5 throughout.  No deformity or atrophy.  Neurologic: CN 2-12 intact. Symmetrical.  Speech is fluent. Motor exam as listed above. Psychiatric: Judgment intact, Mood & affect appropriate for pt's clinical situation. Dermatologic: No rashes or ulcers noted.  No changes consistent with cellulitis. Lymph : No lichenification  or skin changes of chronic lymphedema.  CBC Lab Results  Component Value Date   WBC 16.9 (H) 12/14/2020   HGB 12.6 12/14/2020   HCT 38.7 12/14/2020   MCV 102.1 (H) 12/14/2020   PLT 189 12/14/2020    BMET    Component Value Date/Time   NA 138 12/14/2020 1208   NA 143 10/23/2020 0930   NA 139 11/09/2014 1027   K 3.8 12/14/2020 1208   K 4.7 11/09/2014 1027   CL 99 12/14/2020 1208   CL 108 11/09/2014 1027   CO2 28 12/14/2020 1208   CO2 25 11/09/2014 1027   GLUCOSE 123 (H) 12/14/2020 1208   GLUCOSE 100 (H) 11/09/2014 1027   BUN 13 12/14/2020 1208   BUN 24 10/23/2020 0930   BUN 51 (H) 11/09/2014 1027   CREATININE 3.60 (H) 12/14/2020 1208   CREATININE 2.98 (H) 11/09/2014 1027   CALCIUM 9.3 12/14/2020 1208   CALCIUM 8.9 11/09/2014 1027   GFRNONAA 13 (L) 12/14/2020 1208   GFRNONAA 15 (L) 11/09/2014 1027   GFRAA 11 (L) 05/24/2019 0513   GFRAA 18 (L) 11/09/2014 1027   Estimated Creatinine Clearance: 11.3 mL/min (A) (by C-G formula based on SCr of 3.6 mg/dL (H)).  COAG Lab Results  Component Value Date   INR 1.08 10/03/2016   INR 1.0 11/09/2014    Radiology CT Head Wo Contrast  Result Date: 12/14/2020 CLINICAL DATA:  Facial laceration after syncope and fall. EXAM: CT HEAD WITHOUT CONTRAST CT CERVICAL SPINE WITHOUT CONTRAST TECHNIQUE: Multidetector CT imaging of the head and cervical spine was performed following the standard protocol without intravenous contrast. Multiplanar CT image reconstructions of the cervical spine were also generated. COMPARISON:  May 20, 2019. FINDINGS: CT HEAD FINDINGS Brain: No evidence of acute  infarction, hemorrhage, hydrocephalus, extra-axial collection or mass lesion/mass effect. Vascular: No hyperdense vessel or unexpected calcification. Skull: Normal. Negative for fracture or focal lesion. Sinuses/Orbits: No acute finding. Other: Small right periorbital soft tissue contusion is noted. CT CERVICAL SPINE FINDINGS Alignment: Normal. Skull base and vertebrae: No acute fracture. No primary bone lesion or focal pathologic process. Soft tissues and spinal canal: No prevertebral fluid or swelling. No visible canal hematoma. Disc levels:  Normal. Upper chest: Negative. Other: None. IMPRESSION: Small right periorbital soft tissue contusion is noted. No acute intracranial abnormality seen. No abnormality seen in the cervical spine. Electronically Signed   By: Marijo Conception M.D.   On: 12/14/2020 13:36   CT Chest Wo Contrast  Result Date: 12/14/2020 CLINICAL DATA:  Syncope, fall, right upper back and shoulder pain EXAM: CT CHEST WITHOUT CONTRAST TECHNIQUE: Multidetector CT imaging of the chest was performed following the standard protocol without IV contrast. COMPARISON:  None. FINDINGS: Cardiovascular: Aortic atherosclerosis. Cardiomegaly. Scattered coronary artery calcifications. Small pericardial effusion. Mediastinum/Nodes: No enlarged mediastinal, hilar, or axillary lymph nodes. Thyroid gland, trachea, and esophagus demonstrate no significant findings. Lungs/Pleura: Lungs are clear. No pleural effusion or pneumothorax. Upper Abdomen: No acute abnormality. Numerous bilateral renal cysts, in keeping with dialysis. Musculoskeletal: No chest wall mass or suspicious bone lesions identified. IMPRESSION: 1. No non-contrast CT findings of the chest to explain right upper and right shoulder pain. No displaced fractures. 2. Cardiomegaly. 3. Small pericardial effusion. 4. Coronary artery disease. Aortic Atherosclerosis (ICD10-I70.0). Electronically Signed   By: Eddie Candle M.D.   On: 12/14/2020 13:35   CT  Cervical Spine Wo Contrast  Result Date: 12/14/2020 CLINICAL DATA:  Facial laceration after syncope and fall. EXAM: CT HEAD WITHOUT  CONTRAST CT CERVICAL SPINE WITHOUT CONTRAST TECHNIQUE: Multidetector CT imaging of the head and cervical spine was performed following the standard protocol without intravenous contrast. Multiplanar CT image reconstructions of the cervical spine were also generated. COMPARISON:  May 20, 2019. FINDINGS: CT HEAD FINDINGS Brain: No evidence of acute infarction, hemorrhage, hydrocephalus, extra-axial collection or mass lesion/mass effect. Vascular: No hyperdense vessel or unexpected calcification. Skull: Normal. Negative for fracture or focal lesion. Sinuses/Orbits: No acute finding. Other: Small right periorbital soft tissue contusion is noted. CT CERVICAL SPINE FINDINGS Alignment: Normal. Skull base and vertebrae: No acute fracture. No primary bone lesion or focal pathologic process. Soft tissues and spinal canal: No prevertebral fluid or swelling. No visible canal hematoma. Disc levels:  Normal. Upper chest: Negative. Other: None. IMPRESSION: Small right periorbital soft tissue contusion is noted. No acute intracranial abnormality seen. No abnormality seen in the cervical spine. Electronically Signed   By: Marijo Conception M.D.   On: 12/14/2020 13:36   NM HEPATOBILIARY  Result Date: 12/18/2020 CLINICAL DATA:  Nausea vomiting, abdominal pain, previous cholecystectomy EXAM: NUCLEAR MEDICINE HEPATOBILIARY IMAGING TECHNIQUE: Sequential images of the abdomen were obtained out to 60 minutes following intravenous administration of radiopharmaceutical. RADIOPHARMACEUTICALS:  4.93 mCi Tc-78m  Choletec IV COMPARISON:  12/18/2020 FINDINGS: Prompt uptake and biliary excretion of activity by the liver is seen. Gallbladder is surgically absent. Biliary activity passes into small bowel, consistent with patent common bile duct. IMPRESSION: 1. Normal hepatobiliary scan in this patient status  post cholecystectomy. Electronically Signed   By: Randa Ngo M.D.   On: 12/18/2020 19:45   DG Humerus Right  Result Date: 12/21/2020 CLINICAL DATA:  Right arm pain, fall EXAM: RIGHT HUMERUS - 2+ VIEW COMPARISON:  None. FINDINGS: There is no evidence of fracture or other focal bone lesions. Soft tissues are unremarkable. IMPRESSION: No fracture or dislocation of the right humerus. Electronically Signed   By: Eddie Candle M.D.   On: 12/21/2020 17:56   VAS US DUPLEX DIALYSIS ACCESS (AVF,AVG)  Result Date: 12/27/2020 DIALYSIS ACCESS Patient Name:  SATYA BOHALL  Date of Exam:   12/27/2020 Medical Rec #: [id number]          Accession #:    [id number] Date of Birth: [birth year]           Patient Gender: F Patient Age:   70Y Exam Location:  [facility] Procedure:      VAS US DUPLEX DIALYSIS ACCESS (AVF, AVG) Referring Phys: [id number] Kris Hartmann --------------------------------------------------------------------------------  Reason for Exam: Routine follow up. Access Site: Left Upper Extremity. Access Type: Brachial-cephalic AVF. History: 11/14/14: Left brachial-cephalic AVF creation;          41/7/40: Mid cephalic vein PTA;          04/14/17: Shoulder level cephalic vein PTA/stent;          81/44/81: Cephalic vein PTA at confluence;          11/02/17: Left upper arm cephalic vein & confluence stent;          12/22/18: Left AVF PTA/stent;          06/12/20: Left AVF peripheral segment angioplasty;. Performing Technologist: Blondell Reveal RT, RDMS, RVT  Examination Guidelines: A complete evaluation includes B-mode imaging, spectral Doppler, color Doppler, and power Doppler as needed of all accessible portions of each vessel. Unilateral testing is considered an integral part of a complete examination. Limited examinations for reoccurring indications may be performed as noted.  Findings: +--------------------+----------+-----------------+--------+ AVF  PSV (cm/s)Flow Vol (mL/min)Comments  +--------------------+----------+-----------------+--------+ Native artery inflow   209          2853                +--------------------+----------+-----------------+--------+ AVF Anastomosis        365                              +--------------------+----------+-----------------+--------+  +---------------+----------+-------------+----------+--------------------------+ OUTFLOW VEIN   PSV (cm/s)Diameter (cm)Depth (cm)         Describe          +---------------+----------+-------------+----------+--------------------------+ Subclavian vein   287                                                      +---------------+----------+-------------+----------+--------------------------+ Confluence        295                                     stent            +---------------+----------+-------------+----------+--------------------------+ Clavicle          269                                     stent            +---------------+----------+-------------+----------+--------------------------+ Shoulder          189                                     stent            +---------------+----------+-------------+----------+--------------------------+ Prox UA           218                                     stent            +---------------+----------+-------------+----------+--------------------------+ Mid UA            409        0.54               stent; change in diameter                                                  from 1.35cm to 0.54cm with                                                  internal narrowing near  stent end & 0.29cm                                                           residual lumen       +---------------+----------+-------------+----------+--------------------------+ Dist UA           134        1.03                       aneurysmal          +---------------+----------+-------------+----------+--------------------------+ AC Fossa          100        1.91                       aneurysmal         +---------------+----------+-------------+----------+--------------------------+  Antegrade, biphasic flow in the left distal radial artery.  Summary: Patent left brachial-cephalic AVF with significant velocity increase at the mid upper arm outflow vein level, as described above.  *See table(s) above for measurements and observations.  Diagnosing physician: [name] MD Electronically signed by [name] MD on 12/27/2020 at 4:46:32 PM.  --------------------------------------------------------------------------------   Final    US ABDOMEN LIMITED RUQ (LIVER/GB)  Result Date: 12/18/2020 CLINICAL DATA:  Right upper quadrant pain with nausea and vomiting EXAM: ULTRASOUND ABDOMEN LIMITED RIGHT UPPER QUADRANT COMPARISON:  CT May 22, 2019. FINDINGS: Gallbladder: Surgically absent Common bile duct: Diameter: 9.5 mm, similar to prior CT and favored reservoir effect post cholecystectomy. Liver: No focal lesion identified. Within normal limits in parenchymal echogenicity. Portal vein is patent on color Doppler imaging with normal direction of blood flow towards the liver. Other: None. IMPRESSION: Unremarkable right upper quadrant ultrasound, status post cholecystectomy. Electronically Signed   By: Dahlia Bailiff MD   On: 12/18/2020 10:20     Assessment/Plan 1. ESRD on dialysis [facility]) Recommend:  The patient is experiencing increasing problems with their dialysis access.  Patient should have a fistulagram with the intention for intervention.  The intention for intervention is to restore appropriate flow and prevent thrombosis and possible loss of the access.  As well as improve the quality of dialysis therapy.  The risks, benefits and alternative therapies were reviewed in detail with the patient.  All questions were answered.  The patient  agrees to proceed with left arm fistula angio/intervention.      2. Complication of vascular access for dialysis, sequela See #1  3. Primary hypertension Continue antihypertensive medications as already ordered, these medications have been reviewed and there are no changes at this time.   4. Gastro-esophageal reflux disease without esophagitis Continue PPI as already ordered, this medication has been reviewed and there are no changes at this time.  Avoidence of caffeine and alcohol  Moderate elevation of the head of the bed      [name], MD  12/30/2020 10:58 AM

## 2020-12-30 NOTE — Progress Notes (Signed)
MRN : [id number]  Joyce [name] is a 75 y.o. (1946-03-13) female who presents with chief complaint of  Chief Complaint  Patient presents with   Follow-up    6 Mo U/S   .  History of Present Illness:   The patient returns to the office for follow up regarding problem with the dialysis access. Currently the patient is maintained via a left arm brachial cephalic fistula.  The patient has had multiple failed upper extremity accesses.  The patient notes a significant increase in bleeding time after decannulation.  The patient has also been informed that there is increased recirculation.    The patient denies hand pain or other symptoms consistent with steal phenomena.  No significant arm swelling.  The patient denies redness or swelling at the access site. The patient denies fever or chills at home or while on dialysis.  The patient denies amaurosis fugax or recent TIA symptoms. There are no recent neurological changes noted. The patient denies claudication symptoms or rest pain symptoms. The patient denies history of DVT, PE or superficial thrombophlebitis. The patient denies recent episodes of angina or shortness of breath.   Duplex ultrasound of the AV access shows a patent access.  The previously noted stenosis is increased compared to last study.      Current Meds  Medication Sig   acetaminophen (TYLENOL) 500 MG tablet Take 1,000 mg by mouth 2 (two) times daily as needed (pain.).   alum & mag hydroxide-simeth (GELUSIL) 778-242-35 MG chewable tablet Chew by mouth every 6 (six) hours as needed for indigestion or heartburn.   b complex-vitamin c-folic acid (NEPHRO-VITE) 0.8 MG TABS tablet Take 1 tablet by mouth daily.   bacitracin-polymyxin b (POLYSPORIN) ophthalmic ointment    brimonidine (ALPHAGAN) 0.15 % ophthalmic solution SMARTSIG:In Eye(s)   brimonidine (ALPHAGAN) 0.2 % ophthalmic solution Place 1 drop into both eyes 2 (two) times daily.   calcium acetate (PHOSLO) 667 MG  capsule Take 667 mg by mouth 3 (three) times daily with meals.    carbamazepine (TEGRETOL) 100 MG chewable tablet Chew 100 mg by mouth 2 (two) times daily. (0600 & 2100)   Dexlansoprazole 30 MG capsule Take 30 mg by mouth daily.   diclofenac Sodium (VOLTAREN) 1 % GEL Apply 1 application topically 4 (four) times daily.   dicyclomine (BENTYL) 20 MG tablet Take 20 mg by mouth 3 (three) times daily after meals.   famotidine (PEPCID) 20 MG tablet Take 40 mg by mouth daily as needed for heartburn or indigestion.   famotidine (PEPCID) 40 MG tablet Take 40 mg by mouth daily.   fluticasone (FLONASE) 50 MCG/ACT nasal spray Place 1 spray into both nostrils daily.    haloperidol (HALDOL) 5 MG tablet Take 2.5 mg by mouth 3 (three) times a week. (Tuesday, Thursday and Saturday)   HYDROcodone-acetaminophen (NORCO/VICODIN) 5-325 MG tablet Take 1 tablet by mouth every 4 (four) hours as needed for moderate pain.   ipratropium (ATROVENT) 0.03 % nasal spray Place 2 sprays into both nostrils 2 (two) times daily as needed for rhinitis (congestion).   lidocaine-prilocaine (EMLA) cream Apply 1 application topically every Monday, Wednesday, and Friday with hemodialysis.    loratadine (CLARITIN) 10 MG tablet Take 10 mg by mouth daily at 6 (six) AM.   LORazepam (ATIVAN) 0.5 MG tablet Take 0.5 mg by mouth 3 (three) times daily as needed for anxiety.   midodrine (PROAMATINE) 10 MG tablet Take 1 tablet (10 mg total) by mouth every Monday, Wednesday, and  Friday with hemodialysis. May take an additional doses as needed for SBP<100.   Netarsudil-Latanoprost (ROCKLATAN) 0.02-0.005 % SOLN Place 1 drop into both eyes at bedtime.    ondansetron (ZOFRAN) 4 MG tablet Take 4 mg by mouth every 6 (six) hours as needed for nausea or vomiting.   rOPINIRole (REQUIP) 2 MG tablet Take 1 tablet (2 mg total) by mouth daily.   simethicone (MYLICON) 371 MG chewable tablet Chew 125 mg by mouth in the morning, at noon, and at bedtime.   timolol  (TIMOPTIC) 0.5 % ophthalmic solution Place 1 drop into both eyes 2 (two) times daily.   traZODone (DESYREL) 50 MG tablet Take 25 mg by mouth at bedtime.   vitamin B-12 (CYANOCOBALAMIN) 1000 MCG tablet Take 1,000 mcg by mouth daily at 6 (six) AM.   ziprasidone (GEODON) 20 MG capsule Take 20 mg by mouth at bedtime.    Past Medical History:  Diagnosis Date   Anal fissure    Bipolar affective disorder ([place])    CKD (chronic kidney disease)    Dr Holley Raring [name] 4   Colon polyps    Diverticulitis    Diverticulitis    Family history of adverse reaction to anesthesia    mom - PONV   GERD (gastroesophageal reflux disease)    Headache    migraines - none over 10 yrs   Heart murmur    History of hiatal hernia    Hypertension    Pancreatitis    Valproic acid   Vertigo     Past Surgical History:  Procedure Laterality Date   A/V FISTULAGRAM Left 04/14/2017   Procedure: A/V Fistulagram;  Surgeon: Katha Cabal, MD;  Location: [place] [facility];  Service: Cardiovascular;  Laterality: Left;   A/V FISTULAGRAM Left 06/09/2017   Procedure: A/V FISTULAGRAM;  Surgeon: Katha Cabal, MD;  Location: [place] [facility];  Service: Cardiovascular;  Laterality: Left;   A/V FISTULAGRAM Left 11/02/2017   Procedure: A/V FISTULAGRAM;  Surgeon: Algernon Huxley, MD;  Location: [place] [facility];  Service: Cardiovascular;  Laterality: Left;   A/V FISTULAGRAM Left 12/22/2018   Procedure: A/V FISTULAGRAM;  Surgeon: Katha Cabal, MD;  Location: [place] [facility];  Service: Cardiovascular;  Laterality: Left;   A/V FISTULAGRAM Left 06/12/2020   Procedure: A/V FISTULAGRAM;  Surgeon: Katha Cabal, MD;  Location: [place] [facility];  Service: Cardiovascular;  Laterality: Left;   A/V SHUNT INTERVENTION N/A 11/02/2017   Procedure: A/V SHUNT INTERVENTION;  Surgeon: Algernon Huxley, MD;  Location: [place] [facility];  Service: Cardiovascular;  Laterality: N/A;   ABDOMINAL HYSTERECTOMY  1990 ?    AV FISTULA PLACEMENT  4/30   BREAST EXCISIONAL BIOPSY Left 1994   neg surgical bx   CHOLECYSTECTOMY  2003   COLONOSCOPY  2014   Dr. Jamal Collin   COLONOSCOPY WITH PROPOFOL N/A 09/24/2015   Procedure: COLONOSCOPY WITH random colon byopies.;  Surgeon: Lucilla Lame, MD;  Location: [place];  Service: Endoscopy;  Laterality: N/A;   ESOPHAGOGASTRODUODENOSCOPY (EGD) WITH PROPOFOL N/A 09/24/2015   Procedure: ESOPHAGOGASTRODUODENOSCOPY (EGD) ;  Surgeon: Lucilla Lame, MD;  Location: [place];  Service: Endoscopy;  Laterality: N/A;   ESOPHAGOGASTRODUODENOSCOPY (EGD) WITH PROPOFOL N/A 12/11/2020   Procedure: ESOPHAGOGASTRODUODENOSCOPY (EGD) WITH PROPOFOL;  Surgeon: Lucilla Lame, MD;  Location[facility];  Service: Endoscopy;  Laterality: N/A;   EYE SURGERY     PERIPHERAL VASCULAR CATHETERIZATION N/A 05/29/2015   Procedure: A/V Shuntogram/Fistulagram;  Surgeon: Belenda Cruise  Eloise Levels, MD;  Location: [place] [facility];  Service: Cardiovascular;  Laterality: N/A;   PERIPHERAL VASCULAR CATHETERIZATION N/A 05/29/2015   Procedure: A/V Shunt Intervention;  Surgeon: Katha Cabal, MD;  Location: [place] [facility];  Service: Cardiovascular;  Laterality: N/A;   POLYPECTOMY  09/24/2015   Procedure: POLYPECTOMY INTESTINAL;  Surgeon: Lucilla Lame, MD;  Location: [place];  Service: Endoscopy;;  cecal polyp ascending polyp    Social History Social History   Tobacco Use   Smoking status: Never   Smokeless tobacco: Never  Vaping Use   Vaping Use: Never used  Substance Use Topics   Alcohol use: No    Alcohol/week: 0.0 standard drinks   Drug use: No    Family History Family History  Problem Relation Age of Onset   Stroke Father    Hypertension Father    Breast cancer Other    Colon cancer Neg Hx    Liver disease Neg Hx    Mental illness Neg Hx     Allergies  Allergen Reactions   Morphine And Related Shortness Of Breath    Pt reports chest pain and difficulty breathing.    Gabapentin    Indomethacin Hives   Pollen Extract Other (See Comments)    Sinus problems and HA     REVIEW OF SYSTEMS (Negative unless checked)  Constitutional: [] Weight loss  [] Fever  [] Chills Cardiac: [] Chest pain   [] Chest pressure   [] Palpitations   [] Shortness of breath when laying flat   [] Shortness of breath with exertion. Vascular:  [] Pain in legs with walking   [] Pain in legs at rest  [] History of DVT   [] Phlebitis   [x] Swelling in legs   [] Varicose veins   [] Non-healing ulcers Pulmonary:   [] Uses home oxygen   [] Productive cough   [] Hemoptysis   [] Wheeze  [] COPD   [] Asthma Neurologic:  [] Dizziness   [] Seizures   [] History of stroke   [] History of TIA  [] Aphasia   [] Vissual changes   [] Weakness or numbness in arm   [] Weakness or numbness in leg Musculoskeletal:   [] Joint swelling   [x] Joint pain   [] Low back pain Hematologic:  [] Easy bruising  [] Easy bleeding   [] Hypercoagulable state   [] Anemic Gastrointestinal:  [] Diarrhea   [] Vomiting  [x] Gastroesophageal reflux/heartburn   [] Difficulty swallowing. Genitourinary:  [] Chronic kidney disease   [] Difficult urination  [] Frequent urination   [] Blood in urine Skin:  [] Rashes   [] Ulcers  Psychological:  [x] History of anxiety   []  History of major depression.  Physical Examination  Vitals:   12/27/20 1125  BP: (!) 152/70  Pulse: 60  Weight: 138 lb (62.6 kg)  Height: 5\' 3"  (1.6 m)   Body mass index is 24.45 kg/m. Gen: WD/WN, NAD Head: [place]/AT, No temporalis wasting.  Ear/Nose/Throat: Hearing grossly intact, nares w/o erythema or drainage Eyes: PER, EOMI, sclera nonicteric.  Neck: Supple, no large masses.   Pulmonary:  Good air movement, no audible wheezing bilaterally, no use of accessory muscles.  Cardiac: RRR, no JVD Vascular:  left brachial cephalic fistula markedly pulsatile  Vessel Right Left  Radial Palpable Palpable  Brachial Palpable Palpable  Gastrointestinal: Non-distended. No guarding/no peritoneal signs.   Musculoskeletal: M/S 5/5 throughout.  No deformity or atrophy.  Neurologic: CN 2-12 intact. Symmetrical.  Speech is fluent. Motor exam as listed above. Psychiatric: Judgment intact, Mood & affect appropriate for pt's clinical situation. Dermatologic: No rashes or ulcers noted.  No changes consistent with cellulitis. Lymph : No lichenification  or skin changes of chronic lymphedema.  CBC Lab Results  Component Value Date   WBC 16.9 (H) 12/14/2020   HGB 12.6 12/14/2020   HCT 38.7 12/14/2020   MCV 102.1 (H) 12/14/2020   PLT 189 12/14/2020    BMET    Component Value Date/Time   NA 138 12/14/2020 1208   NA 143 10/23/2020 0930   NA 139 11/09/2014 1027   K 3.8 12/14/2020 1208   K 4.7 11/09/2014 1027   CL 99 12/14/2020 1208   CL 108 11/09/2014 1027   CO2 28 12/14/2020 1208   CO2 25 11/09/2014 1027   GLUCOSE 123 (H) 12/14/2020 1208   GLUCOSE 100 (H) 11/09/2014 1027   BUN 13 12/14/2020 1208   BUN 24 10/23/2020 0930   BUN 51 (H) 11/09/2014 1027   CREATININE 3.60 (H) 12/14/2020 1208   CREATININE 2.98 (H) 11/09/2014 1027   CALCIUM 9.3 12/14/2020 1208   CALCIUM 8.9 11/09/2014 1027   GFRNONAA 13 (L) 12/14/2020 1208   GFRNONAA 15 (L) 11/09/2014 1027   GFRAA 11 (L) 05/24/2019 0513   GFRAA 18 (L) 11/09/2014 1027   Estimated Creatinine Clearance: 11.3 mL/min (A) (by C-G formula based on SCr of 3.6 mg/dL (H)).  COAG Lab Results  Component Value Date   INR 1.08 10/03/2016   INR 1.0 11/09/2014    Radiology CT Head Wo Contrast  Result Date: 12/14/2020 CLINICAL DATA:  Facial laceration after syncope and fall. EXAM: CT HEAD WITHOUT CONTRAST CT CERVICAL SPINE WITHOUT CONTRAST TECHNIQUE: Multidetector CT imaging of the head and cervical spine was performed following the standard protocol without intravenous contrast. Multiplanar CT image reconstructions of the cervical spine were also generated. COMPARISON:  May 20, 2019. FINDINGS: CT HEAD FINDINGS Brain: No evidence of acute  infarction, hemorrhage, hydrocephalus, extra-axial collection or mass lesion/mass effect. Vascular: No hyperdense vessel or unexpected calcification. Skull: Normal. Negative for fracture or focal lesion. Sinuses/Orbits: No acute finding. Other: Small right periorbital soft tissue contusion is noted. CT CERVICAL SPINE FINDINGS Alignment: Normal. Skull base and vertebrae: No acute fracture. No primary bone lesion or focal pathologic process. Soft tissues and spinal canal: No prevertebral fluid or swelling. No visible canal hematoma. Disc levels:  Normal. Upper chest: Negative. Other: None. IMPRESSION: Small right periorbital soft tissue contusion is noted. No acute intracranial abnormality seen. No abnormality seen in the cervical spine. Electronically Signed   By: Marijo Conception M.D.   On: 12/14/2020 13:36   CT Chest Wo Contrast  Result Date: 12/14/2020 CLINICAL DATA:  Syncope, fall, right upper back and shoulder pain EXAM: CT CHEST WITHOUT CONTRAST TECHNIQUE: Multidetector CT imaging of the chest was performed following the standard protocol without IV contrast. COMPARISON:  None. FINDINGS: Cardiovascular: Aortic atherosclerosis. Cardiomegaly. Scattered coronary artery calcifications. Small pericardial effusion. Mediastinum/Nodes: No enlarged mediastinal, hilar, or axillary lymph nodes. Thyroid gland, trachea, and esophagus demonstrate no significant findings. Lungs/Pleura: Lungs are clear. No pleural effusion or pneumothorax. Upper Abdomen: No acute abnormality. Numerous bilateral renal cysts, in keeping with dialysis. Musculoskeletal: No chest wall mass or suspicious bone lesions identified. IMPRESSION: 1. No non-contrast CT findings of the chest to explain right upper and right shoulder pain. No displaced fractures. 2. Cardiomegaly. 3. Small pericardial effusion. 4. Coronary artery disease. Aortic Atherosclerosis (ICD10-I70.0). Electronically Signed   By: Eddie Candle M.D.   On: 12/14/2020 13:35   CT  Cervical Spine Wo Contrast  Result Date: 12/14/2020 CLINICAL DATA:  Facial laceration after syncope and fall. EXAM: CT HEAD WITHOUT  CONTRAST CT CERVICAL SPINE WITHOUT CONTRAST TECHNIQUE: Multidetector CT imaging of the head and cervical spine was performed following the standard protocol without intravenous contrast. Multiplanar CT image reconstructions of the cervical spine were also generated. COMPARISON:  May 20, 2019. FINDINGS: CT HEAD FINDINGS Brain: No evidence of acute infarction, hemorrhage, hydrocephalus, extra-axial collection or mass lesion/mass effect. Vascular: No hyperdense vessel or unexpected calcification. Skull: Normal. Negative for fracture or focal lesion. Sinuses/Orbits: No acute finding. Other: Small right periorbital soft tissue contusion is noted. CT CERVICAL SPINE FINDINGS Alignment: Normal. Skull base and vertebrae: No acute fracture. No primary bone lesion or focal pathologic process. Soft tissues and spinal canal: No prevertebral fluid or swelling. No visible canal hematoma. Disc levels:  Normal. Upper chest: Negative. Other: None. IMPRESSION: Small right periorbital soft tissue contusion is noted. No acute intracranial abnormality seen. No abnormality seen in the cervical spine. Electronically Signed   By: Marijo Conception M.D.   On: 12/14/2020 13:36   NM HEPATOBILIARY  Result Date: 12/18/2020 CLINICAL DATA:  Nausea vomiting, abdominal pain, previous cholecystectomy EXAM: NUCLEAR MEDICINE HEPATOBILIARY IMAGING TECHNIQUE: Sequential images of the abdomen were obtained out to 60 minutes following intravenous administration of radiopharmaceutical. RADIOPHARMACEUTICALS:  4.93 mCi Tc-34m  Choletec IV COMPARISON:  12/18/2020 FINDINGS: Prompt uptake and biliary excretion of activity by the liver is seen. Gallbladder is surgically absent. Biliary activity passes into small bowel, consistent with patent common bile duct. IMPRESSION: 1. Normal hepatobiliary scan in this patient status  post cholecystectomy. Electronically Signed   By: Randa Ngo M.D.   On: 12/18/2020 19:45   DG Humerus Right  Result Date: 12/21/2020 CLINICAL DATA:  Right arm pain, fall EXAM: RIGHT HUMERUS - 2+ VIEW COMPARISON:  None. FINDINGS: There is no evidence of fracture or other focal bone lesions. Soft tissues are unremarkable. IMPRESSION: No fracture or dislocation of the right humerus. Electronically Signed   By: Eddie Candle M.D.   On: 12/21/2020 17:56   VAS US DUPLEX DIALYSIS ACCESS (AVF,AVG)  Result Date: 12/27/2020 DIALYSIS ACCESS Patient Name:  ROSEMARY MOSSBARGER  Date of Exam:   12/27/2020 Medical Rec #: [id number]          Accession #:    [id number] Date of Birth: June 17, 1946           Patient Gender: F Patient Age:   28Y Exam Location:  [facility] Procedure:      VAS US DUPLEX DIALYSIS ACCESS (AVF, AVG) Referring Phys: [id number] Kris Hartmann --------------------------------------------------------------------------------  Reason for Exam: Routine follow up. Access Site: Left Upper Extremity. Access Type: Brachial-cephalic AVF. History: 11/14/14: Left brachial-cephalic AVF creation;          35/5/73: Mid cephalic vein PTA;          04/14/17: Shoulder level cephalic vein PTA/stent;          22/02/54: Cephalic vein PTA at confluence;          11/02/17: Left upper arm cephalic vein & confluence stent;          12/22/18: Left AVF PTA/stent;          06/12/20: Left AVF peripheral segment angioplasty;. Performing Technologist: Blondell Reveal RT, RDMS, RVT  Examination Guidelines: A complete evaluation includes B-mode imaging, spectral Doppler, color Doppler, and power Doppler as needed of all accessible portions of each vessel. Unilateral testing is considered an integral part of a complete examination. Limited examinations for reoccurring indications may be performed as noted.  Findings: +--------------------+----------+-----------------+--------+ AVF  PSV (cm/s)Flow Vol (mL/min)Comments  +--------------------+----------+-----------------+--------+ Native artery inflow   209          2853                +--------------------+----------+-----------------+--------+ AVF Anastomosis        365                              +--------------------+----------+-----------------+--------+  +---------------+----------+-------------+----------+--------------------------+ OUTFLOW VEIN   PSV (cm/s)Diameter (cm)Depth (cm)         Describe          +---------------+----------+-------------+----------+--------------------------+ Subclavian vein   287                                                      +---------------+----------+-------------+----------+--------------------------+ Confluence        295                                     stent            +---------------+----------+-------------+----------+--------------------------+ Clavicle          269                                     stent            +---------------+----------+-------------+----------+--------------------------+ Shoulder          189                                     stent            +---------------+----------+-------------+----------+--------------------------+ Prox UA           218                                     stent            +---------------+----------+-------------+----------+--------------------------+ Mid UA            409        0.54               stent; change in diameter                                                  from 1.35cm to 0.54cm with                                                  internal narrowing near  stent end & 0.29cm                                                           residual lumen       +---------------+----------+-------------+----------+--------------------------+ Dist UA           134        1.03                       aneurysmal          +---------------+----------+-------------+----------+--------------------------+ AC Fossa          100        1.91                       aneurysmal         +---------------+----------+-------------+----------+--------------------------+  Antegrade, biphasic flow in the left distal radial artery.  Summary: Patent left brachial-cephalic AVF with significant velocity increase at the mid upper arm outflow vein level, as described above.  *See table(s) above for measurements and observations.  Diagnosing physician: [name] MD Electronically signed by [name] MD on 12/27/2020 at 4:46:32 PM.  --------------------------------------------------------------------------------   Final    US ABDOMEN LIMITED RUQ (LIVER/GB)  Result Date: 12/18/2020 CLINICAL DATA:  Right upper quadrant pain with nausea and vomiting EXAM: ULTRASOUND ABDOMEN LIMITED RIGHT UPPER QUADRANT COMPARISON:  CT May 22, 2019. FINDINGS: Gallbladder: Surgically absent Common bile duct: Diameter: 9.5 mm, similar to prior CT and favored reservoir effect post cholecystectomy. Liver: No focal lesion identified. Within normal limits in parenchymal echogenicity. Portal vein is patent on color Doppler imaging with normal direction of blood flow towards the liver. Other: None. IMPRESSION: Unremarkable right upper quadrant ultrasound, status post cholecystectomy. Electronically Signed   By: Dahlia Bailiff MD   On: 12/18/2020 10:20     Assessment/Plan 1. ESRD on dialysis [facility]) Recommend:  The patient is experiencing increasing problems with their dialysis access.  Patient should have a fistulagram with the intention for intervention.  The intention for intervention is to restore appropriate flow and prevent thrombosis and possible loss of the access.  As well as improve the quality of dialysis therapy.  The risks, benefits and alternative therapies were reviewed in detail with the patient.  All questions were answered.  The patient  agrees to proceed with left arm fistula angio/intervention.      2. Complication of vascular access for dialysis, sequela See #1  3. Primary hypertension Continue antihypertensive medications as already ordered, these medications have been reviewed and there are no changes at this time.   4. Gastro-esophageal reflux disease without esophagitis Continue PPI as already ordered, this medication has been reviewed and there are no changes at this time.  Avoidence of caffeine and alcohol  Moderate elevation of the head of the bed      [name], MD  12/30/2020 10:58 AM

## 2020-12-31 NOTE — Telephone Encounter (Signed)
Spoke with Tillie Rung at [facility] living. Patient's  left arm fistulagram on 01/08/21 with a 1:00 pm arrival has been rescheduled per the facility to 01/22/21 with a 10:00 am arrival time to the MM. Pre-procedure instructions will be faxed to [facility].

## 2021-01-10 DIAGNOSIS — N186 End stage renal disease: Secondary | ICD-10-CM | POA: Insufficient documentation

## 2021-01-18 ENCOUNTER — Other Ambulatory Visit: Payer: Self-pay

## 2021-01-18 MED ORDER — MIDODRINE HCL 10 MG PO TABS: 10.0000 mg | ORAL_TABLET | ORAL | 1 refills | Status: DC

## 2021-01-22 ENCOUNTER — Other Ambulatory Visit (INDEPENDENT_AMBULATORY_CARE_PROVIDER_SITE_OTHER): Payer: Self-pay | Admitting: Nurse Practitioner

## 2021-01-22 ENCOUNTER — Encounter
Admission: RE | Disposition: A | Discharge status: Home / Self Care | Payer: Self-pay | Source: Home / Self Care | Attending: Vascular Surgery

## 2021-01-22 ENCOUNTER — Ambulatory Visit
Admission: RE | Admit: 2021-01-22 | Discharge: 2021-01-22 | Disposition: A | Discharge status: Home / Self Care | Payer: Medicare HMO | Attending: Vascular Surgery | Admitting: Vascular Surgery

## 2021-01-22 ENCOUNTER — Other Ambulatory Visit: Payer: Self-pay

## 2021-01-22 ENCOUNTER — Encounter: Payer: Self-pay | Admitting: Vascular Surgery

## 2021-01-22 DIAGNOSIS — Y841 Kidney dialysis as the cause of abnormal reaction of the patient, or of later complication, without mention of misadventure at the time of the procedure: Secondary | ICD-10-CM | POA: Insufficient documentation

## 2021-01-22 DIAGNOSIS — Z885 Allergy status to narcotic agent status: Secondary | ICD-10-CM | POA: Diagnosis not present

## 2021-01-22 DIAGNOSIS — I120 Hypertensive chronic kidney disease with stage 5 chronic kidney disease or end stage renal disease: Secondary | ICD-10-CM | POA: Diagnosis not present

## 2021-01-22 DIAGNOSIS — Z79899 Other long term (current) drug therapy: Secondary | ICD-10-CM | POA: Diagnosis not present

## 2021-01-22 DIAGNOSIS — N186 End stage renal disease: Secondary | ICD-10-CM | POA: Insufficient documentation

## 2021-01-22 DIAGNOSIS — T82858A Stenosis of vascular prosthetic devices, implants and grafts, initial encounter: Secondary | ICD-10-CM | POA: Insufficient documentation

## 2021-01-22 DIAGNOSIS — Z888 Allergy status to other drugs, medicaments and biological substances status: Secondary | ICD-10-CM | POA: Insufficient documentation

## 2021-01-22 DIAGNOSIS — Z992 Dependence on renal dialysis: Secondary | ICD-10-CM | POA: Insufficient documentation

## 2021-01-22 DIAGNOSIS — Z0000 Encounter for general adult medical examination without abnormal findings: Secondary | ICD-10-CM

## 2021-01-22 DIAGNOSIS — K219 Gastro-esophageal reflux disease without esophagitis: Secondary | ICD-10-CM | POA: Insufficient documentation

## 2021-01-22 DIAGNOSIS — Y832 Surgical operation with anastomosis, bypass or graft as the cause of abnormal reaction of the patient, or of later complication, without mention of misadventure at the time of the procedure: Secondary | ICD-10-CM | POA: Diagnosis not present

## 2021-01-22 DIAGNOSIS — I871 Compression of vein: Secondary | ICD-10-CM | POA: Diagnosis not present

## 2021-01-22 HISTORY — PX: A/V FISTULAGRAM: CATH118298

## 2021-01-22 LAB — POTASSIUM (ARMC VASCULAR LAB ONLY): Potassium (ARMC vascular lab): 5.5 — ABNORMAL HIGH (ref 3.5–5.1)

## 2021-01-22 SURGERY — A/V FISTULAGRAM
Anesthesia: Moderate Sedation | Laterality: Left

## 2021-01-22 MED ORDER — SODIUM CHLORIDE 0.9 % IV SOLN: INTRAVENOUS | Status: DC

## 2021-01-22 MED ORDER — MIDAZOLAM HCL 2 MG/2ML IJ SOLN
INTRAMUSCULAR | Status: AC
  Filled 2021-01-22: qty 2

## 2021-01-22 MED ORDER — MIDAZOLAM HCL 2 MG/2ML IJ SOLN
INTRAMUSCULAR | Status: DC | PRN
  Administered 2021-01-22: 1 mg via INTRAVENOUS

## 2021-01-22 MED ORDER — FENTANYL CITRATE (PF) 100 MCG/2ML IJ SOLN
INTRAMUSCULAR | Status: AC
  Filled 2021-01-22: qty 2

## 2021-01-22 MED ORDER — DIPHENHYDRAMINE HCL 50 MG/ML IJ SOLN: 50.0000 mg | Freq: Once | INTRAMUSCULAR | Status: DC | PRN

## 2021-01-22 MED ORDER — MIDAZOLAM HCL 2 MG/ML PO SYRP: 8.0000 mg | ORAL_SOLUTION | Freq: Once | ORAL | Status: DC | PRN

## 2021-01-22 MED ORDER — ONDANSETRON HCL 4 MG/2ML IJ SOLN: 4.0000 mg | Freq: Four times a day (QID) | INTRAMUSCULAR | Status: DC | PRN

## 2021-01-22 MED ORDER — FAMOTIDINE 20 MG PO TABS: 40.0000 mg | ORAL_TABLET | Freq: Once | ORAL | Status: DC | PRN

## 2021-01-22 MED ORDER — IODIXANOL 320 MG/ML IV SOLN
INTRAVENOUS | Status: DC | PRN
  Administered 2021-01-22: 15 mL

## 2021-01-22 MED ORDER — CLINDAMYCIN PHOSPHATE 300 MG/50ML IV SOLN: 300.0000 mg | Freq: Once | INTRAVENOUS | Status: AC

## 2021-01-22 MED ORDER — FENTANYL CITRATE (PF) 100 MCG/2ML IJ SOLN
12.5000 ug | Freq: Once | INTRAMUSCULAR | Status: DC | PRN
Start: 2021-01-22 — End: 2021-01-22

## 2021-01-22 MED ORDER — HEPARIN SODIUM (PORCINE) 1000 UNIT/ML IJ SOLN
INTRAMUSCULAR | Status: DC | PRN
  Administered 2021-01-22: 2000 [IU] via INTRAVENOUS

## 2021-01-22 MED ORDER — FENTANYL CITRATE (PF) 100 MCG/2ML IJ SOLN
INTRAMUSCULAR | Status: DC | PRN
  Administered 2021-01-22: 25 ug via INTRAVENOUS

## 2021-01-22 MED ORDER — CLINDAMYCIN PHOSPHATE 300 MG/50ML IV SOLN
INTRAVENOUS | Status: AC
  Administered 2021-01-22: 300 mg via INTRAVENOUS
  Filled 2021-01-22: qty 50

## 2021-01-22 MED ORDER — METHYLPREDNISOLONE SODIUM SUCC 125 MG IJ SOLR: 125.0000 mg | Freq: Once | INTRAMUSCULAR | Status: DC | PRN

## 2021-01-22 SURGICAL SUPPLY — 16 items
BALLN DORADO 7X40X80 (BALLOONS) ×2
BALLN LUTONIX AV 8X60X75 (BALLOONS) ×2
BALLN ULTRVRSE 10X60X75 (BALLOONS) ×2
BALLOON DORADO 7X40X80 (BALLOONS) ×1 IMPLANT
BALLOON LUTONIX AV 8X60X75 (BALLOONS) ×1 IMPLANT
BALLOON ULTRVRSE 10X60X75 (BALLOONS) ×1 IMPLANT
CANNULA 5F STIFF (CANNULA) ×2 IMPLANT
COVER PROBE U/S 5X48 (MISCELLANEOUS) ×2 IMPLANT
DRAPE BRACHIAL (DRAPES) ×2 IMPLANT
KIT ENCORE 26 ADVANTAGE (KITS) ×2 IMPLANT
PACK ANGIOGRAPHY (CUSTOM PROCEDURE TRAY) ×2 IMPLANT
SHEATH BRITE TIP 6FRX5.5 (SHEATH) ×2 IMPLANT
SHEATH BRITE TIP 8FR 5.5 (SHEATH) ×2 IMPLANT
STENT VIABAHN 10X5X120 (Permanent Stent) ×2 IMPLANT
SUT MNCRL AB 4-0 PS2 18 (SUTURE) ×2 IMPLANT
WIRE MAGIC TOR.035 180C (WIRE) ×2 IMPLANT

## 2021-01-22 NOTE — Op Note (Signed)
OPERATIVE NOTE   PROCEDURE: Contrast injection left brachiocephalic AV access Percutaneous transluminal angioplasty and stent placement peripheral segment left brachiocephalic fistula with angioplasty of the central venous anatomy  PRE-OPERATIVE DIAGNOSIS: Complication of dialysis access                                                       End Stage Renal Disease  POST-OPERATIVE DIAGNOSIS: same as above   SURGEON: Katha Cabal, M.D.  ANESTHESIA: Conscious sedation was administered under my direct supervision by the interventional radiology RN. IV Versed plus fentanyl were utilized. Continuous ECG, pulse oximetry and blood pressure was monitored throughout the entire procedure.  Conscious sedation was for a total of 30 minutes.  ESTIMATED BLOOD LOSS: minimal  FINDING(S): Greater than 90% stenosis at the leading edge of the previously placed Viabahn stent there is also greater than 60% stenosis at the cephalic subclavian confluence involving the proximal edge of the stent.  SPECIMEN(S):  None  CONTRAST: 15 cc  FLUOROSCOPY TIME: 2.2 minutes  INDICATIONS: Joyce [name] is a 75 y.o. female who  presents with malfunctioning left arm AV access.  The patient is scheduled for angiography with possible intervention of the AV access.  The patient is aware the risks include but are not limited to: bleeding, infection, thrombosis of the cannulated access, and possible anaphylactic reaction to the contrast.  The patient acknowledges if the access can not be salvaged a tunneled catheter will be needed and will be placed during this procedure.  The patient is aware of the risks of the procedure and elects to proceed with the angiogram and intervention.  DESCRIPTION: After full informed written consent was obtained, the patient was brought back to the Special Procedure suite and placed supine position.  Appropriate cardiopulmonary monitors were placed.  The left arm was prepped and draped in  the standard fashion.  Appropriate timeout is called. The left brachiocephalic fistula was cannulated with a micropuncture needle.  Cannulation was performed with ultrasound guidance. Ultrasound was placed in a sterile sleeve, the AV access was interrogated and noted to be echolucent and compressible indicating patency. Image was recorded for the permanent record. The puncture is performed under continuous ultrasound visualization.   The microwire was advanced and the needle was exchanged for  a microsheath.  The J-wire was then advanced and a 6 Fr sheath inserted this was upsized to an 8 [place] sheath later in the case.  Hand injections were completed to image the access from the arterial anastomosis through the entire access.  The central venous structures were also imaged by hand injections.  Interpretation: The arterial anastomosis is widely patent and the cephalic vein is aneurysmal throughout the initial portion of the fistula.  Right at the leading edge of the Viabahn stent there is a greater than 90% focal stenosis.  The remaining portion of the Viabahn stent is widely patent.  At the cephalic subclavian confluence there is a greater than 60% stenosis.  More proximal to this lesion the subclavian vein is widely patent as is the innominate and superior vena cava  Based on the images,  3000 units of heparin was given and a Magic torque wire was negotiated through the strictures within the venous portion of the graft.  An 7 mm x 40 mm [place] balloon was used to initially  treat both the central lesion at the confluence of the subclavian and cephalic vein as well as the peripheral lesion in the leading edge of the stent.  Both inflations were to 14 atm for 1 minute.  This balloon was then repositioned and used to treat the lesion at the leading edge of the stent again the inflation was for 14 atm for approximately 1 minute.  Next a 10 mm x 50 mm Viabahn was deployed across the stenoses at the leading edge  and postdilated with an 10 mm Ultraverse balloon inflated to 14 atm for approximately 30 seconds.  Lastly, an 8 mm Lutonix drug-eluting balloon was used to treat the lesion at the cephalic confluence extending into the subclavian  Follow-up imaging demonstrates complete resolution of the stricture with rapid flow of contrast through the graft, the central venous anatomy is preserved.  A 4-0 Monocryl purse-string suture was sewn around the sheath.  The sheath was removed and light pressure was applied.  A sterile bandage was applied to the puncture site.    COMPLICATIONS: None  CONDITION: Joyce [name], M.D [facility] Office: [phone]  01/22/2021 12:20 PM

## 2021-01-22 NOTE — Interval H&P Note (Signed)
History and Physical Interval Note:  01/22/2021 10:20 AM  Joyce [name]  has presented today for surgery, with the diagnosis of LT arm fistulagram  End Stage Renal.  The various methods of treatment have been discussed with the patient and family. After consideration of risks, benefits and other options for treatment, the patient has consented to  Procedure(s): A/V FISTULAGRAM (Left) as a surgical intervention.  The patient's history has been reviewed, patient examined, no change in status, stable for surgery.  I have reviewed the patient's chart and labs.  Questions were answered to the patient's satisfaction.     [name]

## 2021-03-29 ENCOUNTER — Other Ambulatory Visit (INDEPENDENT_AMBULATORY_CARE_PROVIDER_SITE_OTHER): Payer: Self-pay | Admitting: Vascular Surgery

## 2021-03-29 DIAGNOSIS — T829XXS Unspecified complication of cardiac and vascular prosthetic device, implant and graft, sequela: Secondary | ICD-10-CM

## 2021-03-29 DIAGNOSIS — N186 End stage renal disease: Secondary | ICD-10-CM

## 2021-04-18 ENCOUNTER — Telehealth (INDEPENDENT_AMBULATORY_CARE_PROVIDER_SITE_OTHER): Payer: Self-pay | Admitting: Vascular Surgery

## 2021-04-18 NOTE — Telephone Encounter (Signed)
LVM stating that her fingers are numb. Patient would like to come in to be evaluated. Patient had surgery 01/22/21 a/v fistulagram (no d/c directions). Patient is scheduled to come in 06/2021 (67mos f/u w/ hda studies). Please advise.

## 2021-05-01 ENCOUNTER — Emergency Department
Admission: EM | Admit: 2021-05-01 | Discharge: 2021-05-01 | Disposition: A | Discharge status: Home / Self Care | Payer: Medicare HMO | Attending: Emergency Medicine | Admitting: Emergency Medicine

## 2021-05-01 ENCOUNTER — Emergency Department: Payer: Medicare HMO

## 2021-05-01 ENCOUNTER — Other Ambulatory Visit: Payer: Self-pay

## 2021-05-01 DIAGNOSIS — Z85038 Personal history of other malignant neoplasm of large intestine: Secondary | ICD-10-CM | POA: Diagnosis not present

## 2021-05-01 DIAGNOSIS — I120 Hypertensive chronic kidney disease with stage 5 chronic kidney disease or end stage renal disease: Secondary | ICD-10-CM | POA: Diagnosis not present

## 2021-05-01 DIAGNOSIS — R0789 Other chest pain: Secondary | ICD-10-CM | POA: Diagnosis present

## 2021-05-01 DIAGNOSIS — Z992 Dependence on renal dialysis: Secondary | ICD-10-CM | POA: Insufficient documentation

## 2021-05-01 DIAGNOSIS — D631 Anemia in chronic kidney disease: Secondary | ICD-10-CM | POA: Insufficient documentation

## 2021-05-01 DIAGNOSIS — R0781 Pleurodynia: Secondary | ICD-10-CM

## 2021-05-01 DIAGNOSIS — N186 End stage renal disease: Secondary | ICD-10-CM | POA: Diagnosis not present

## 2021-05-01 MED ORDER — TRAMADOL HCL 50 MG PO TABS
50.0000 mg | ORAL_TABLET | Freq: Once | ORAL | Status: AC
  Administered 2021-05-01: 50 mg via ORAL
  Filled 2021-05-01: qty 1

## 2021-05-01 MED ORDER — TRAMADOL HCL 50 MG PO TABS: 50.0000 mg | ORAL_TABLET | Freq: Four times a day (QID) | ORAL | 0 refills | Status: DC | PRN

## 2021-05-01 NOTE — ED Triage Notes (Addendum)
Pt states she fell in April and injured right arm and right rib area. STates has been going to physical therapy for the same. States xrays to arm were negative but did not have rib xrays done. Pt co right rib pain that is worse on movement and palpation. Pt is on dialysis, had last treatment Monday.

## 2021-05-01 NOTE — Discharge Instructions (Addendum)
1.  You may take Ultram as needed for pain. 2.  You may apply ice or heat to the affected area. 3.  Use incentive spirometer as instructed. 4.  Return to the ER for worsening symptoms, persistent vomiting, difficulty breathing, fever or other concerns.

## 2021-05-01 NOTE — ED Notes (Signed)
Pt given instruction on incentive spirometer and provided return demonstration.

## 2021-05-01 NOTE — ED Provider Notes (Signed)
[facility] Emergency Department Provider Note   ____________________________________________   Event Date/Time   First MD Initiated Contact with Patient 05/01/21 0502     (approximate)  I have reviewed the triage vital signs and the nursing notes.   HISTORY  Chief Complaint Rib Injury    HPI Joyce [name] is a 75 y.o. female who presents to the ED from home with a chief complaint of right rib pain.  Patient reports she fell in April and injured her right arm and rib area.  Has been going to physical therapy which stopped in August.  Been having pain in her right rib area for the past 2 months and wanted an x-ray done.  Complains of worsening rib pain on movement and palpation.  Denies more recent injury.  History of ESRD on HD M/W/F; had last dialysis on Monday.  Denies fever, cough, shortness of breath, abdominal pain, nausea, vomiting or dizziness     Past Medical History:  Diagnosis Date  . Anal fissure   . Bipolar affective disorder ([place])   . CKD (chronic kidney disease)    Dr Holley Raring [name] 4  . Colon polyps   . Diverticulitis   . Diverticulitis   . Family history of adverse reaction to anesthesia    mom - PONV  . GERD (gastroesophageal reflux disease)   . Headache    migraines - none over 10 yrs  . Heart murmur   . History of hiatal hernia   . Hypertension   . Pancreatitis    Valproic acid  . Vertigo     Patient Active Problem List   Diagnosis Date Noted  . Gastritis without bleeding   . Renal hemorrhage, left 05/23/2019  . Renal cyst, native, hemorrhage 05/22/2019  . RLS (restless legs syndrome) 03/23/2019  . Insomnia due to medical condition 03/23/2019  . Gastro-esophageal reflux disease without esophagitis 06/29/2018  . Bipolar disorder, current episode mixed, mild ([place]) 06/29/2018  . Cardiac murmur, unspecified 06/29/2018  . Neurocognitive disorder 06/29/2018  . Other infective otitis externa, left ear 06/29/2018  .  Vitamin B12 deficiency anemia 06/29/2018  . Suicide attempt ([place]) 03/09/2018  . Acute respiratory failure with hypoxia ([place]) 03/04/2018  . Bipolar I disorder, most recent episode depressed ([place]) 02/26/2018  . UTI (urinary tract infection) 12/16/2017  . Palliative care encounter   . Bipolar affective disorder, current episode depressed with psychotic symptoms ([place]) 09/17/2017  . Bipolar I disorder, most recent episode (or current) manic ([place]) 09/02/2017  . Acute delirium 09/02/2017  . Altered mental status   . Acute encephalopathy 09/01/2017  . ESRD on dialysis ([place]) 04/06/2017  . Complication of vascular access for dialysis 04/06/2017  . Hematuria 12/18/2016  . Polycystic kidney 10/12/2016  . Ruptured cyst of kidney 10/03/2016  . HTN (hypertension) 10/01/2016  . Anemia associated with chronic renal failure 09/24/2016  . Noninfectious diarrhea   . Benign neoplasm of cecum   . Benign neoplasm of ascending colon   . Diarrhea   . Nausea   . Gastric polyp   . Chronic constipation 12/27/2014  . Nausea with vomiting 12/27/2014  . Left sided abdominal pain 12/27/2014  . Personal history of colonic polyps 05/10/2013    Past Surgical History:  Procedure Laterality Date  . A/V FISTULAGRAM Left 04/14/2017   Procedure: A/V Fistulagram;  Surgeon: Katha Cabal, MD;  Location: [facility];  Service: Cardiovascular;  Laterality: Left;  . A/V FISTULAGRAM Left 06/09/2017   Procedure: A/V  FISTULAGRAM;  Surgeon: Katha Cabal, MD;  Location: [facility];  Service: Cardiovascular;  Laterality: Left;  . A/V FISTULAGRAM Left 11/02/2017   Procedure: A/V FISTULAGRAM;  Surgeon: Algernon Huxley, MD;  Location: [facility];  Service: Cardiovascular;  Laterality: Left;  . A/V FISTULAGRAM Left 12/22/2018   Procedure: A/V FISTULAGRAM;  Surgeon: Katha Cabal, MD;  Location: [facility];  Service: Cardiovascular;  Laterality: Left;  . A/V FISTULAGRAM Left  06/12/2020   Procedure: A/V FISTULAGRAM;  Surgeon: Katha Cabal, MD;  Location: [facility];  Service: Cardiovascular;  Laterality: Left;  . A/V FISTULAGRAM Left 01/22/2021   Procedure: A/V FISTULAGRAM;  Surgeon: Katha Cabal, MD;  Location: [facility];  Service: Cardiovascular;  Laterality: Left;  . A/V SHUNT INTERVENTION N/A 11/02/2017   Procedure: A/V SHUNT INTERVENTION;  Surgeon: Algernon Huxley, MD;  Location: [facility];  Service: Cardiovascular;  Laterality: N/A;  . ABDOMINAL HYSTERECTOMY  1990 ?  . AV FISTULA PLACEMENT  4/30  . BREAST EXCISIONAL BIOPSY Left 1994   neg surgical bx  . CHOLECYSTECTOMY  2003  . COLONOSCOPY  2014   Dr. Jamal Collin  . COLONOSCOPY WITH PROPOFOL N/A 09/24/2015   Procedure: COLONOSCOPY WITH random colon byopies.;  Surgeon: Lucilla Lame, MD;  Location: [facility];  Service: Endoscopy;  Laterality: N/A;  . ESOPHAGOGASTRODUODENOSCOPY (EGD) WITH PROPOFOL N/A 09/24/2015   Procedure: ESOPHAGOGASTRODUODENOSCOPY (EGD) ;  Surgeon: Lucilla Lame, MD;  Location: [facility];  Service: Endoscopy;  Laterality: N/A;  . ESOPHAGOGASTRODUODENOSCOPY (EGD) WITH PROPOFOL N/A 12/11/2020   Procedure: ESOPHAGOGASTRODUODENOSCOPY (EGD) WITH PROPOFOL;  Surgeon: Lucilla Lame, MD;  Location: [facility];  Service: Endoscopy;  Laterality: N/A;  . EYE SURGERY    . PERIPHERAL VASCULAR CATHETERIZATION N/A 05/29/2015   Procedure: A/V Shuntogram/Fistulagram;  Surgeon: Katha Cabal, MD;  Location: [facility];  Service: Cardiovascular;  Laterality: N/A;  . PERIPHERAL VASCULAR CATHETERIZATION N/A 05/29/2015   Procedure: A/V Shunt Intervention;  Surgeon: Katha Cabal, MD;  Location: [facility];  Service: Cardiovascular;  Laterality: N/A;  . POLYPECTOMY  09/24/2015   Procedure: POLYPECTOMY INTESTINAL;  Surgeon: Lucilla Lame, MD;  Location: [facility];  Service: Endoscopy;;  cecal polyp ascending polyp    Prior to  Admission medications   Medication Sig Start Date End Date Taking? Authorizing Provider  acetaminophen (TYLENOL) 500 MG tablet Take 1,000 mg by mouth in the morning. (0800)    [provider]  acetaminophen (TYLENOL) 500 MG tablet Take 1,000 mg by mouth 2 (two) times daily as needed (pain (max 4000 mg/day)).    [provider]  alum & mag hydroxide-simeth (MAALOX/MYLANTA) 200-200-20 MG/5ML suspension Take 15 mLs by mouth every 6 (six) hours as needed (reflux/nausea).    [provider]  Ascorbic Acid (VITAJOY DAILY C GUMMIES) 125 MG CHEW Chew 250 mg by mouth in the morning and at bedtime. (0600 & 2100)    [provider]  bacitracin-polymyxin b (POLYSPORIN) ophthalmic ointment Place 1 application into both eyes daily as needed (infection). 10/12/20   [provider]  brimonidine (ALPHAGAN) 0.2 % ophthalmic solution Place 1 drop into both eyes 2 (two) times daily. Patient taking differently: Place 1 drop into both eyes 2 (two) times daily. (0600 & 2000) 10/22/17   Clapacs, [name], MD  calcium acetate (PHOSLO) 667 MG capsule Take 667 mg by mouth 3 (three) times daily with meals. (0900, 1300 &  1800) 01/04/19   [provider]  carbamazepine (TEGRETOL) 100 MG chewable tablet Chew 100 mg by mouth 2 (two) times daily. (0600 & 2100)    [provider]  Dexlansoprazole 30 MG capsule Take 30 mg by mouth daily. (0630)    [provider]  diclofenac Sodium (VOLTAREN) 1 % GEL Apply 1 application topically 4 (four) times daily. (0800, 1200, 1600 & 2000) 12/20/20   [provider]  dicyclomine (BENTYL) 20 MG tablet Take 20 mg by mouth 3 (three) times daily after meals.    [provider]  famotidine (PEPCID) 40 MG tablet Take 40 mg by mouth at bedtime. (2000) 07/25/20   [provider]  fluticasone (FLONASE) 50 MCG/ACT nasal spray Place 1 spray into both nostrils in the morning. (0700)    [provider]   ipratropium (ATROVENT) 0.03 % nasal spray Place 2 sprays into both nostrils 2 (two) times daily as needed for rhinitis (congestion).    [provider]  meloxicam (MOBIC) 7.5 MG tablet Take 7.5 mg by mouth daily as needed for pain.    [provider]  midodrine (PROAMATINE) 10 MG tablet Take 1 tablet (10 mg total) by mouth every Monday, Wednesday, and Friday with hemodialysis. May take an additional doses as needed for SBP<100. 01/18/21   Agbor-Etang, Aaron Edelman, MD  ondansetron (ZOFRAN) 4 MG tablet Take 4 mg by mouth every 6 (six) hours as needed for nausea or vomiting.    [provider]  vitamin B-12 (CYANOCOBALAMIN) 1000 MCG tablet Take 1,000 mcg by mouth in the morning. (0600)    [provider]  ziprasidone (GEODON) 20 MG capsule Take 20 mg by mouth at bedtime. (2100)    [provider]    Allergies Morphine and related, Gabapentin, Indomethacin, Penicillins, and Pollen extract  Family History  Problem Relation Age of Onset  . Stroke Father   . Hypertension Father   . Breast cancer Other   . Colon cancer Neg Hx   . Liver disease Neg Hx   . Mental illness Neg Hx     Social History Social History   Tobacco Use  . Smoking status: Never  . Smokeless tobacco: Never  Vaping Use  . Vaping Use: Never used  Substance Use Topics  . Alcohol use: No    Alcohol/week: 0.0 standard drinks  . Drug use: No    Review of Systems  Constitutional: No fever/chills Eyes: No visual changes. ENT: No sore throat. Cardiovascular: Positive for right rib pain. Respiratory: Denies shortness of breath. Gastrointestinal: No abdominal pain.  No nausea, no vomiting.  No diarrhea.  No constipation. Genitourinary: Negative for dysuria. Musculoskeletal: Negative for back pain. Skin: Negative for rash. Neurological: Negative for headaches, focal weakness or numbness.   ____________________________________________   PHYSICAL EXAM:  VITAL SIGNS: ED Triage  Vitals [05/01/21 0247]  Enc Vitals Group     BP 123/89     Pulse Rate 61     Resp 20     Temp 97.7 F (36.5 C)     Temp Source Oral     SpO2 100 %     Weight 140 lb (63.5 kg)     Height 5\' 3"  (1.6 m)     Head Circumference      Peak Flow      Pain Score 8     Pain Loc      Pain Edu?      Excl. in [place]?     Constitutional:  Alert and oriented.  Elderly appearing and in no acute distress. Eyes: Conjunctivae are normal. PERRL. EOMI. Head: Atraumatic. Nose: No congestion/rhinnorhea. Mouth/Throat: Mucous membranes are moist.   Neck: No stridor.  No cervical spine tenderness to palpation. Cardiovascular: Normal rate, regular rhythm. Grossly normal heart sounds.  Good peripheral circulation. Respiratory: Normal respiratory effort.  No retractions. Lungs CTAB.  No splinting.  No crepitus.  Right lateral ribs tender to palpation and with movement of trunk. Gastrointestinal: Soft and nontender. No distention. No abdominal bruits. No CVA tenderness. Musculoskeletal: No lower extremity tenderness nor edema.  No joint effusions. Neurologic:  Normal speech and language. No gross focal neurologic deficits are appreciated. No gait instability. Skin:  Skin is warm, dry and intact. No rash noted.  No vesicles. Psychiatric: Mood and affect are normal. Speech and behavior are normal.  ____________________________________________   LABS (all labs ordered are listed, but only abnormal results are displayed)  Labs Reviewed - No data to display ____________________________________________  EKG  None ____________________________________________  RADIOLOGY I, Ignatius Kloos J, personally viewed and evaluated these images (plain radiographs) as part of my medical decision making, as well as reviewing the written report by the radiologist.  ED MD interpretation: No acute cardiopulmonary process  Official radiology report(s): DG Ribs Unilateral W/Chest Right  Result Date: 05/01/2021 CLINICAL DATA:   Rib pain EXAM: RIGHT RIBS AND CHEST - 3+ VIEW COMPARISON:  05/20/2019 FINDINGS: No fracture or other bone lesions are seen involving the ribs. There is no evidence of pneumothorax or pleural effusion. Both lungs are clear. Heart size and mediastinal contours are within normal limits. IMPRESSION: Negative. Electronically Signed   By: Ulyses Jarred M.D.   On: 05/01/2021 03:14    ____________________________________________   PROCEDURES  Procedure(s) performed (including Critical Care):  Procedures   ____________________________________________   INITIAL IMPRESSION / ASSESSMENT AND PLAN / ED COURSE  As part of my medical decision making, I reviewed the following data within the [place] notes reviewed and incorporated, Old chart reviewed, Radiograph reviewed, Notes from prior ED visits, and [place] Controlled Substance Database     75 year old female presenting with right rib pain from injury back in April.  X-ray negative for acute injury or pneumothorax.  She is point tender on palpation.  Will place on Ultram as needed for pain, incentive spirometer and patient will follow up closely with her PCP.  Strict return precautions given.  Patient verbalizes understanding and agrees with plan of care.      ____________________________________________   FINAL CLINICAL IMPRESSION(S) / ED DIAGNOSES  Final diagnoses:  Chest wall pain     ED Discharge Orders     None        Note:  This document was prepared using Dragon voice recognition software and may include unintentional dictation errors.    Paulette Blanch, MD 05/01/21 [phone]

## 2021-05-02 ENCOUNTER — Ambulatory Visit (INDEPENDENT_AMBULATORY_CARE_PROVIDER_SITE_OTHER): Payer: Medicare HMO | Admitting: Nurse Practitioner

## 2021-05-02 ENCOUNTER — Encounter (INDEPENDENT_AMBULATORY_CARE_PROVIDER_SITE_OTHER): Payer: Medicare HMO

## 2021-05-09 ENCOUNTER — Ambulatory Visit (INDEPENDENT_AMBULATORY_CARE_PROVIDER_SITE_OTHER): Payer: Medicare HMO

## 2021-05-09 ENCOUNTER — Ambulatory Visit (INDEPENDENT_AMBULATORY_CARE_PROVIDER_SITE_OTHER): Payer: Medicare HMO | Admitting: Nurse Practitioner

## 2021-05-09 ENCOUNTER — Other Ambulatory Visit: Payer: Self-pay

## 2021-05-09 ENCOUNTER — Encounter (INDEPENDENT_AMBULATORY_CARE_PROVIDER_SITE_OTHER): Payer: Self-pay | Admitting: Nurse Practitioner

## 2021-05-09 VITALS — BP 112/67 | HR 66 | Ht 63.0 in | Wt 144.0 lb

## 2021-05-09 DIAGNOSIS — T829XXS Unspecified complication of cardiac and vascular prosthetic device, implant and graft, sequela: Secondary | ICD-10-CM | POA: Diagnosis not present

## 2021-05-09 DIAGNOSIS — I10 Essential (primary) hypertension: Secondary | ICD-10-CM

## 2021-05-09 DIAGNOSIS — N186 End stage renal disease: Secondary | ICD-10-CM

## 2021-05-09 DIAGNOSIS — K219 Gastro-esophageal reflux disease without esophagitis: Secondary | ICD-10-CM | POA: Diagnosis not present

## 2021-05-09 NOTE — Progress Notes (Signed)
Subjective:    Patient ID: Joyce [name], female    DOB: 01-21-1946, 75 y.o.   MRN: [id number] Chief Complaint  Patient presents with   Follow-up    Add on per phone note numbness in [place] is a 75 year old female that presents today for evaluation of her left brachiocephalic AV fistula.  She notes that her left fourth and fifth finger are numb during dialysis.  This only happens occasionally and only tends to happen after she has pulled off about 3 L.  This pain only happens during dialysis and it subsides fairly quickly.  She notes that the rest of her hand is not involved.  She also notes that the pain that she has is tolerable because is not consistent.  She denies any open wounds or ulcerations.  She denies any color changes.  She denies any issues with dialysis such as bleeding or machine alarms.  Today noninvasive studies show flow volume of 1498.  There is a small aneurysmal area in the distal upper arm.  No evidence of stenosis post angioplasty there is also no evidence of steal noted.   Review of Systems  Neurological:  Positive for numbness.  Hematological:  Does not bruise/bleed easily.  All other systems reviewed and are negative.     Objective:   Physical Exam Vitals reviewed.  HENT:     Head: Normocephalic.  Cardiovascular:     Rate and Rhythm: Normal rate.     Pulses:          Radial pulses are 1+ on the right side.     Arteriovenous access: Left arteriovenous access is present.    Comments: Left brachiocephalic AV fistula with good thrill and bruit Pulmonary:     Effort: Pulmonary effort is normal.  Skin:    General: Skin is warm and dry.  Neurological:     Mental Status: She is alert and oriented to person, place, and time.  Psychiatric:        Mood and Affect: Mood normal.        Behavior: Behavior normal.        Thought Content: Thought content normal.        Judgment: Judgment normal.    BP 112/67   Pulse 66   Ht 5\' 3"  (1.6  m)   Wt 144 lb (65.3 kg)   BMI 25.51 kg/m   Past Medical History:  Diagnosis Date   Anal fissure    Bipolar affective disorder (HCC)    CKD (chronic kidney disease)    Dr Holley Raring [name] 4   Colon polyps    Diverticulitis    Diverticulitis    Family history of adverse reaction to anesthesia    mom - PONV   GERD (gastroesophageal reflux disease)    Headache    migraines - none over 10 yrs   Heart murmur    History of hiatal hernia    Hypertension    Pancreatitis    Valproic acid   Vertigo     Social History   Socioeconomic History   Marital status: Divorced    Spouse name: Not on file   Number of children: 1   Years of education: Not on file   Highest education level: Not on file  Occupational History   Occupation: Surveyor, quantity: [employer]    Comment: retired  Tobacco Use   Smoking status: Never   Smokeless tobacco: Never  Vaping Use   Vaping Use: Never used  Substance and Sexual Activity   Alcohol use: No    Alcohol/week: 0.0 standard drinks   Drug use: No   Sexual activity: Not Currently  Other Topics Concern   Not on file  Social History Narrative   LIves alone, divorced, 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   Social Determinants of Health   Financial Resource Strain: Not on file  Food Insecurity: Not on file  Transportation Needs: Not on file  Physical Activity: Not on file  Stress: Not on file  Social Connections: Not on file  Intimate Partner Violence: Not on file    Past Surgical History:  Procedure Laterality Date   A/V FISTULAGRAM Left 04/14/2017   Procedure: A/V Fistulagram;  Surgeon: Katha Cabal, MD;  Location: [facility];  Service: Cardiovascular;  Laterality: Left;   A/V FISTULAGRAM Left 06/09/2017   Procedure: A/V FISTULAGRAM;  Surgeon: Katha Cabal, MD;  Location: [facility];  Service: Cardiovascular;  Laterality: Left;   A/V FISTULAGRAM Left 11/02/2017   Procedure:  A/V FISTULAGRAM;  Surgeon: Algernon Huxley, MD;  Location: [facility];  Service: Cardiovascular;  Laterality: Left;   A/V FISTULAGRAM Left 12/22/2018   Procedure: A/V FISTULAGRAM;  Surgeon: Katha Cabal, MD;  Location: [facility];  Service: Cardiovascular;  Laterality: Left;   A/V FISTULAGRAM Left 06/12/2020   Procedure: A/V FISTULAGRAM;  Surgeon: Katha Cabal, MD;  Location: [facility];  Service: Cardiovascular;  Laterality: Left;   A/V FISTULAGRAM Left 01/22/2021   Procedure: A/V FISTULAGRAM;  Surgeon: Katha Cabal, MD;  Location: [facility];  Service: Cardiovascular;  Laterality: Left;   A/V SHUNT INTERVENTION N/A 11/02/2017   Procedure: A/V SHUNT INTERVENTION;  Surgeon: Algernon Huxley, MD;  Location: [facility];  Service: Cardiovascular;  Laterality: N/A;   ABDOMINAL HYSTERECTOMY  1990 ?   AV FISTULA PLACEMENT  4/30   BREAST EXCISIONAL BIOPSY Left 1994   neg surgical bx   CHOLECYSTECTOMY  2003   COLONOSCOPY  2014   Dr. Jamal Collin   COLONOSCOPY WITH PROPOFOL N/A 09/24/2015   Procedure: COLONOSCOPY WITH random colon byopies.;  Surgeon: Lucilla Lame, MD;  Location: [facility];  Service: Endoscopy;  Laterality: N/A;   ESOPHAGOGASTRODUODENOSCOPY (EGD) WITH PROPOFOL N/A 09/24/2015   Procedure: ESOPHAGOGASTRODUODENOSCOPY (EGD) ;  Surgeon: Lucilla Lame, MD;  Location: [facility];  Service: Endoscopy;  Laterality: N/A;   ESOPHAGOGASTRODUODENOSCOPY (EGD) WITH PROPOFOL N/A 12/11/2020   Procedure: ESOPHAGOGASTRODUODENOSCOPY (EGD) WITH PROPOFOL;  Surgeon: Lucilla Lame, MD;  [facility];  Service: Endoscopy;  Laterality: N/A;   EYE SURGERY     PERIPHERAL VASCULAR CATHETERIZATION N/A 05/29/2015   Procedure: A/V Shuntogram/Fistulagram;  Surgeon: Katha Cabal, MD;  Location: [facility];  Service: Cardiovascular;  Laterality: N/A;   PERIPHERAL VASCULAR CATHETERIZATION N/A 05/29/2015   Procedure: A/V Shunt  Intervention;  Surgeon: Katha Cabal, MD;  Location: [facility];  Service: Cardiovascular;  Laterality: N/A;   POLYPECTOMY  09/24/2015   Procedure: POLYPECTOMY INTESTINAL;  Surgeon: Lucilla Lame, MD;  Location: [place];  Service: Endoscopy;;  cecal polyp ascending polyp    Family History  Problem Relation Age of Onset   Stroke Father    Hypertension Father    Breast cancer Other    Colon cancer Neg Hx    Liver disease Neg Hx  Mental illness Neg Hx     Allergies  Allergen Reactions   Morphine And Related Shortness Of Breath    Pt reports chest pain and difficulty breathing.   Gabapentin     PER MAR   Indomethacin Hives   Penicillins Other (See Comments)    PER MAR   Pollen Extract Other (See Comments)    Sinus problems and HA    CBC Latest Ref Rng & Units 12/14/2020 10/23/2020 07/16/2020  WBC 4.0 - 10.5 K/uL 16.9(H) 7.0 9.6  Hemoglobin 12.0 - 15.0 g/dL 12.6 11.4 10.7(L)  Hematocrit 36.0 - 46.0 % 38.7 34.4 32.2(L)  Platelets 150 - 400 K/uL 189 283 191      CMP     Component Value Date/Time   NA 138 12/14/2020 1208   NA 143 10/23/2020 0930   NA 139 11/09/2014 1027   K 3.8 12/14/2020 1208   K 4.7 11/09/2014 1027   CL 99 12/14/2020 1208   CL 108 11/09/2014 1027   CO2 28 12/14/2020 1208   CO2 25 11/09/2014 1027   GLUCOSE 123 (H) 12/14/2020 1208   GLUCOSE 100 (H) 11/09/2014 1027   BUN 13 12/14/2020 1208   BUN 24 10/23/2020 0930   BUN 51 (H) 11/09/2014 1027   CREATININE 3.60 (H) 12/14/2020 1208   CREATININE 2.98 (H) 11/09/2014 1027   CALCIUM 9.3 12/14/2020 1208   CALCIUM 8.9 11/09/2014 1027   PROT 7.4 12/14/2020 1208   PROT 5.3 (L) 05/14/2014 0412   ALBUMIN 4.1 12/14/2020 1208   ALBUMIN 2.7 (L) 05/14/2014 0412   AST 20 12/14/2020 1208   AST 18 05/14/2014 0412   ALT 14 12/14/2020 1208   ALT 25 05/14/2014 0412   ALKPHOS 71 12/14/2020 1208   ALKPHOS 53 05/14/2014 0412   BILITOT 0.9 12/14/2020 1208   BILITOT 0.5 05/14/2014 0412    GFRNONAA 13 (L) 12/14/2020 1208   GFRNONAA 15 (L) 11/09/2014 1027   GFRAA 11 (L) 05/24/2019 0513   GFRAA 18 (L) 11/09/2014 1027     No results found.     Assessment & Plan:   1. ESRD (end stage renal disease) ([place]) Today the patient does not have any evidence of steal on ultrasound.  She notes that the still like symptoms only happen occasionally when too much fluid is pulled her dialysis.  We discussed angiogram for evaluation of steal syndrome as this can sometimes show up more on angiogram versus ultrasound.  She notes that the pain is not significant at this time if she can tolerate it during dialysis.  Based on this as well as discussion, the patient will continue with dialysis with no intervention at this time.  However it is also noted that if she begins to have significant pain or if it increases or involves her entire hand we can reevaluate.  Otherwise we will have the patient return in 6 months with noninvasive studies.  2. Essential hypertension Continue antihypertensive medications as already ordered, these medications have been reviewed and there are no changes at this time.   3. Gastro-esophageal reflux disease without esophagitis Continue PPI as already ordered, this medication has been reviewed and there are no changes at this time.  Avoidence of caffeine and alcohol  Moderate elevation of the head of the bed     Current Outpatient Medications on File Prior to Visit  Medication Sig Dispense Refill   acetaminophen (TYLENOL) 500 MG tablet Take 1,000 mg by mouth in the morning. (0800)     acetaminophen (  TYLENOL) 500 MG tablet Take 1,000 mg by mouth 2 (two) times daily as needed (pain (max 4000 mg/day)).     alum & mag hydroxide-simeth (MAALOX/MYLANTA) 200-200-20 MG/5ML suspension Take 15 mLs by mouth every 6 (six) hours as needed (reflux/nausea).     Ascorbic Acid (VITAJOY DAILY C GUMMIES) 125 MG CHEW Chew 250 mg by mouth in the morning and at bedtime. (0600 & 2100)      bacitracin-polymyxin b (POLYSPORIN) ophthalmic ointment Place 1 application into both eyes daily as needed (infection).     brimonidine (ALPHAGAN) 0.2 % ophthalmic solution Place 1 drop into both eyes 2 (two) times daily. (Patient taking differently: Place 1 drop into both eyes 2 (two) times daily. (0600 & 2000)) 5 mL 1   calcium acetate (PHOSLO) 667 MG capsule Take 667 mg by mouth 3 (three) times daily with meals. (0900, 1300 & 1800)     carbamazepine (TEGRETOL) 100 MG chewable tablet Chew 100 mg by mouth 2 (two) times daily. (0600 & 2100)     Cyanocobalamin (VITAMIN B12) 1000 MCG TBCR 1 tablet     Dexlansoprazole 30 MG capsule Take 30 mg by mouth daily. (0630)     diclofenac Sodium (VOLTAREN) 1 % GEL Apply 1 application topically 4 (four) times daily. (0800, 1200, 1600 & 2000)     dicyclomine (BENTYL) 20 MG tablet Take 20 mg by mouth 3 (three) times daily after meals.     famotidine (PEPCID) 40 MG tablet Take 40 mg by mouth at bedtime. (2000)     fludrocortisone (FLORINEF) 0.1 MG tablet Take by mouth.     fluticasone (FLONASE) 50 MCG/ACT nasal spray Place 1 spray into both nostrils in the morning. (0700)     haloperidol (HALDOL) 5 MG tablet TAKE 1/2 TABLET THREE TIMES WEEKLY ON MONDAY, WEDNESDAY AND FRIDAY  MORNING     ipratropium (ATROVENT) 0.03 % nasal spray Place 2 sprays into both nostrils 2 (two) times daily as needed for rhinitis (congestion).     lidocaine-prilocaine (EMLA) cream APPLY TO ACCESS AREA 30 MINUTES BEFORE DIALYSIS MONDAY / WEDNESDAY / FRIDAY     meloxicam (MOBIC) 7.5 MG tablet Take 7.5 mg by mouth daily as needed for pain.     metoprolol tartrate (LOPRESSOR) 25 MG tablet metoprolol tartrate 25 mg tablet     midodrine (PROAMATINE) 10 MG tablet Take 1 tablet (10 mg total) by mouth every Monday, Wednesday, and Friday with hemodialysis. May take an additional doses as needed for SBP<100. 40 tablet 1   Netarsudil-Latanoprost (ROCKLATAN) 0.02-0.005 % SOLN INSTILL 1 DROP INTO EACH EYE  AT BEDTIME     ondansetron (ZOFRAN) 4 MG tablet Take 4 mg by mouth every 6 (six) hours as needed for nausea or vomiting.     rOPINIRole (REQUIP) 2 MG tablet Take 2 mg by mouth at bedtime.     timolol (TIMOPTIC) 0.5 % ophthalmic solution 1 drop into both eyes     traMADol (ULTRAM) 50 MG tablet Take 1 tablet (50 mg total) by mouth every 6 (six) hours as needed. 20 tablet 0   traZODone (DESYREL) 50 MG tablet TAKE 1/2 TABLET     vitamin B-12 (CYANOCOBALAMIN) 1000 MCG tablet Take 1,000 mcg by mouth in the morning. (0600)     ziprasidone (GEODON) 20 MG capsule Take 20 mg by mouth at bedtime. (2100)     No current facility-administered medications on file prior to visit.    There are no Patient Instructions on file for this visit. No  follow-ups on file.   Kris Hartmann, NP

## 2021-06-17 ENCOUNTER — Other Ambulatory Visit: Payer: Self-pay | Admitting: *Deleted

## 2021-06-17 MED ORDER — MIDODRINE HCL 10 MG PO TABS: 10.0000 mg | ORAL_TABLET | ORAL | 1 refills | Status: DC

## 2021-06-18 ENCOUNTER — Telehealth (INDEPENDENT_AMBULATORY_CARE_PROVIDER_SITE_OTHER): Payer: Self-pay

## 2021-06-18 ENCOUNTER — Telehealth (INDEPENDENT_AMBULATORY_CARE_PROVIDER_SITE_OTHER): Payer: Self-pay | Admitting: Nurse Practitioner

## 2021-06-18 NOTE — Telephone Encounter (Signed)
Joyce [name] is a 75 year old female that contacted our office by phone to indicate that she was having continuing issues with her left hand with numbness during dialysis.  As previously discussed with the patient if this continues she will need to undergo a steal study.  I discussed with the patient the risk, benefits and alternatives and the patient agrees to proceed with a left upper extremity angiogram for treatment of steal symptoms.  The patient is also aware that if this intervention is unsuccessful further procedures may be necessary.

## 2021-06-18 NOTE — Telephone Encounter (Signed)
I called and discussed with patient, let's get her scheduled for a left upper extremity angio with Dr. Delana Meyer for ESRD

## 2021-06-18 NOTE — Telephone Encounter (Signed)
Patient had facility call in stating that patient is still having issues with her same hand that the port is in. Wanting to be added to the sch asap...  She is stating that it is cramping in the hand,numbness and pain thinking she may have to have another surgery due to this    Please advise   [phone] [name]

## 2021-06-19 ENCOUNTER — Telehealth (INDEPENDENT_AMBULATORY_CARE_PROVIDER_SITE_OTHER): Payer: Self-pay

## 2021-06-19 NOTE — Telephone Encounter (Signed)
I spoke with [name] at [facility] and the patient is scheduled with Dr. Delana Meyer for a left UE angio on 07/02/21 with a  12:00 pm arrival time to the [facility]. Pre-procedure instructions will be faxed to attention [name].

## 2021-06-19 NOTE — Telephone Encounter (Signed)
I have attempted to contact [facility] where the patient resides. I received a call back from Earlyne Iba and she stated she would have to have transportation call to schedule the patient for a left UE angio with Dr. Delana Meyer.

## 2021-06-26 ENCOUNTER — Telehealth (INDEPENDENT_AMBULATORY_CARE_PROVIDER_SITE_OTHER): Payer: Self-pay

## 2021-06-26 NOTE — Telephone Encounter (Signed)
Patient has been made aware that her new arrival time will be 2 pm for her schedule procedure with Dr Delana Meyer on 07/02/21

## 2021-06-27 ENCOUNTER — Ambulatory Visit (INDEPENDENT_AMBULATORY_CARE_PROVIDER_SITE_OTHER): Payer: Medicare HMO | Admitting: Vascular Surgery

## 2021-06-27 ENCOUNTER — Encounter (INDEPENDENT_AMBULATORY_CARE_PROVIDER_SITE_OTHER): Payer: Medicare HMO

## 2021-07-02 ENCOUNTER — Encounter: Payer: Self-pay | Admitting: Vascular Surgery

## 2021-07-02 ENCOUNTER — Other Ambulatory Visit (INDEPENDENT_AMBULATORY_CARE_PROVIDER_SITE_OTHER): Payer: Self-pay | Admitting: Nurse Practitioner

## 2021-07-02 ENCOUNTER — Ambulatory Visit
Admission: RE | Admit: 2021-07-02 | Discharge: 2021-07-02 | Disposition: A | Discharge status: Home / Self Care | Payer: Medicare HMO | Attending: Vascular Surgery | Admitting: Vascular Surgery

## 2021-07-02 ENCOUNTER — Other Ambulatory Visit: Payer: Self-pay

## 2021-07-02 ENCOUNTER — Encounter
Admission: RE | Disposition: A | Discharge status: Home / Self Care | Payer: Self-pay | Source: Home / Self Care | Attending: Vascular Surgery

## 2021-07-02 DIAGNOSIS — K219 Gastro-esophageal reflux disease without esophagitis: Secondary | ICD-10-CM | POA: Diagnosis not present

## 2021-07-02 DIAGNOSIS — Y841 Kidney dialysis as the cause of abnormal reaction of the patient, or of later complication, without mention of misadventure at the time of the procedure: Secondary | ICD-10-CM | POA: Diagnosis not present

## 2021-07-02 DIAGNOSIS — N186 End stage renal disease: Secondary | ICD-10-CM | POA: Insufficient documentation

## 2021-07-02 DIAGNOSIS — T82898A Other specified complication of vascular prosthetic devices, implants and grafts, initial encounter: Secondary | ICD-10-CM | POA: Insufficient documentation

## 2021-07-02 DIAGNOSIS — I120 Hypertensive chronic kidney disease with stage 5 chronic kidney disease or end stage renal disease: Secondary | ICD-10-CM | POA: Insufficient documentation

## 2021-07-02 DIAGNOSIS — M79642 Pain in left hand: Secondary | ICD-10-CM | POA: Diagnosis present

## 2021-07-02 DIAGNOSIS — Z992 Dependence on renal dialysis: Secondary | ICD-10-CM | POA: Insufficient documentation

## 2021-07-02 DIAGNOSIS — N185 Chronic kidney disease, stage 5: Secondary | ICD-10-CM | POA: Diagnosis not present

## 2021-07-02 HISTORY — PX: UPPER EXTREMITY ANGIOGRAPHY: CATH118270

## 2021-07-02 LAB — POTASSIUM (ARMC VASCULAR LAB ONLY): Potassium (ARMC vascular lab): 4.3 (ref 3.5–5.1)

## 2021-07-02 SURGERY — UPPER EXTREMITY ANGIOGRAPHY
Anesthesia: Moderate Sedation | Site: Arm Upper | Laterality: Left

## 2021-07-02 MED ORDER — ONDANSETRON HCL 4 MG/2ML IJ SOLN: 4.0000 mg | Freq: Four times a day (QID) | INTRAMUSCULAR | Status: DC | PRN

## 2021-07-02 MED ORDER — FENTANYL CITRATE (PF) 100 MCG/2ML IJ SOLN
INTRAMUSCULAR | Status: AC
  Filled 2021-07-02: qty 2

## 2021-07-02 MED ORDER — METHYLPREDNISOLONE SODIUM SUCC 125 MG IJ SOLR: 125.0000 mg | Freq: Once | INTRAMUSCULAR | Status: DC | PRN

## 2021-07-02 MED ORDER — FAMOTIDINE 20 MG PO TABS: 40.0000 mg | ORAL_TABLET | Freq: Once | ORAL | Status: DC | PRN

## 2021-07-02 MED ORDER — MIDAZOLAM HCL 2 MG/2ML IJ SOLN
INTRAMUSCULAR | Status: AC
  Filled 2021-07-02: qty 2

## 2021-07-02 MED ORDER — MORPHINE SULFATE (PF) 4 MG/ML IV SOLN: 2.0000 mg | INTRAVENOUS | Status: DC | PRN

## 2021-07-02 MED ORDER — FENTANYL CITRATE (PF) 100 MCG/2ML IJ SOLN
INTRAMUSCULAR | Status: DC | PRN
  Administered 2021-07-02 (×2): 25 ug via INTRAVENOUS

## 2021-07-02 MED ORDER — HEPARIN SODIUM (PORCINE) 1000 UNIT/ML IJ SOLN
INTRAMUSCULAR | Status: DC | PRN
  Administered 2021-07-02: 3000 [IU] via INTRAVENOUS

## 2021-07-02 MED ORDER — MIDAZOLAM HCL 2 MG/2ML IJ SOLN
INTRAMUSCULAR | Status: DC | PRN
  Administered 2021-07-02: .5 mg via INTRAVENOUS
  Administered 2021-07-02: 2 mg via INTRAVENOUS

## 2021-07-02 MED ORDER — HEPARIN SODIUM (PORCINE) 1000 UNIT/ML IJ SOLN
INTRAMUSCULAR | Status: AC
  Filled 2021-07-02: qty 10

## 2021-07-02 MED ORDER — FENTANYL CITRATE (PF) 100 MCG/2ML IJ SOLN: 12.5000 ug | Freq: Once | INTRAMUSCULAR | Status: DC | PRN

## 2021-07-02 MED ORDER — DIPHENHYDRAMINE HCL 50 MG/ML IJ SOLN: 50.0000 mg | Freq: Once | INTRAMUSCULAR | Status: DC | PRN

## 2021-07-02 MED ORDER — CLINDAMYCIN PHOSPHATE 300 MG/50ML IV SOLN
300.0000 mg | Freq: Once | INTRAVENOUS | Status: AC
  Administered 2021-07-02: 300 mg via INTRAVENOUS

## 2021-07-02 MED ORDER — SODIUM CHLORIDE 0.9 % IV SOLN: INTRAVENOUS | Status: DC

## 2021-07-02 MED ORDER — MIDAZOLAM HCL 2 MG/ML PO SYRP: 8.0000 mg | ORAL_SOLUTION | Freq: Once | ORAL | Status: DC | PRN

## 2021-07-02 SURGICAL SUPPLY — 14 items
CANNULA 5F STIFF (CANNULA) ×1 IMPLANT
CATH ANGIO 5F PIGTAIL 100CM (CATHETERS) ×1 IMPLANT
CATH BEACON 5 .035 100 H1 TIP (CATHETERS) ×1 IMPLANT
CATH SEEKER .035X150CM (CATHETERS) ×1 IMPLANT
COVER PROBE U/S 5X48 (MISCELLANEOUS) ×1 IMPLANT
DEVICE STARCLOSE SE CLOSURE (Vascular Products) ×1 IMPLANT
GAUZE SPONGE 4X4 12PLY STRL (GAUZE/BANDAGES/DRESSINGS) ×2 IMPLANT
GLIDEWIRE ANGLED SS 035X260CM (WIRE) ×1 IMPLANT
PACK ANGIOGRAPHY (CUSTOM PROCEDURE TRAY) ×1 IMPLANT
SHEATH NEURON MAX 6FR 80CM (SHEATH) ×1 IMPLANT
SHEATH PINNACLE 5F 10CM (SHEATH) ×1 IMPLANT
SYR MEDRAD MARK 7 150ML (SYRINGE) ×1 IMPLANT
TUBING CONTRAST HIGH PRESS 72 (TUBING) ×1 IMPLANT
WIRE GUIDERIGHT .035X150 (WIRE) ×1 IMPLANT

## 2021-07-02 NOTE — H&P (Signed)
@LOGO @   MRN : [id number]  Joyce [name] is a 75 y.o. (Dec 20, 1945) female who presents with chief complaint of hand pain while on dialysis.  History of Present Illness:   The patient presents today for evaluation of her left arm and brachiocephalic AV fistula to assess for steal phenomena.  She notes that her left fourth and fifth finger are numb during dialysis.  This only happens occasionally and only tends to happen after she has pulled off about 3 L.  This pain only happens during dialysis and it subsides fairly quickly.  She notes that the rest of her hand is not involved.  She also notes that the pain that she has is tolerable because is not consistent.  She denies any open wounds or ulcerations.  She denies any color changes.  She denies any issues with dialysis such as bleeding or machine alarms.   Today noninvasive studies show flow volume of 1498.  There is a small aneurysmal area in the distal upper arm.  No evidence of stenosis post angioplasty there is also no evidence of steal noted.  Current Meds  Medication Sig   acetaminophen (TYLENOL) 500 MG tablet Take 1,000 mg by mouth daily as needed for moderate pain.   brimonidine (ALPHAGAN) 0.2 % ophthalmic solution Place 1 drop into both eyes 2 (two) times daily. (Patient taking differently: Place 1 drop into both eyes 2 (two) times daily. (0600 & 2000))   calcium acetate (PHOSLO) 667 MG capsule Take 667 mg by mouth 3 (three) times daily with meals.   carbamazepine (TEGRETOL) 100 MG chewable tablet Chew 100 mg by mouth 2 (two) times daily. (0600 & 2100)   Carboxymethylcellulose Sod PF 1 % GEL Place 3 drops into the right eye 4 (four) times daily.   Dexlansoprazole 30 MG capsule Take 30 mg by mouth daily. (0630)   diclofenac Sodium (VOLTAREN) 1 % GEL Apply 2 g topically 3 (three) times daily as needed (pain).   dicyclomine (BENTYL) 20 MG tablet Take 20 mg by mouth 3 (three) times daily after meals.   famotidine (PEPCID) 40 MG tablet  Take 40 mg by mouth at bedtime. (2000)   fluticasone (FLONASE) 50 MCG/ACT nasal spray Place 1 spray into both nostrils in the morning. (0700)   haloperidol (HALDOL) 5 MG tablet TAKE 1/2 TABLET THREE TIMES WEEKLY ON MONDAY, WEDNESDAY AND FRIDAY  MORNING   lidocaine-prilocaine (EMLA) cream APPLY TO ACCESS AREA 30 MINUTES BEFORE DIALYSIS MONDAY / WEDNESDAY / FRIDAY   loratadine (CLARITIN) 10 MG tablet Take 10 mg by mouth daily.   midodrine (PROAMATINE) 10 MG tablet Take 1 tablet (10 mg total) by mouth every Monday, Wednesday, and Friday with hemodialysis. May take an additional doses as needed for SBP<100. (Patient taking differently: Take 10 mg by mouth daily.)   montelukast (SINGULAIR) 10 MG tablet Take 10 mg by mouth daily.   Netarsudil-Latanoprost (ROCKLATAN) 0.02-0.005 % SOLN INSTILL 1 DROP INTO EACH EYE AT BEDTIME   ondansetron (ZOFRAN) 4 MG tablet Take 4 mg by mouth every 6 (six) hours as needed for nausea or vomiting.   rOPINIRole (REQUIP) 2 MG tablet Take 2 mg by mouth at bedtime.   timolol (TIMOPTIC) 0.5 % ophthalmic solution Place 1 drop into both eyes 2 (two) times daily.   traMADol (ULTRAM) 50 MG tablet Take 1 tablet (50 mg total) by mouth every 6 (six) hours as needed.   traZODone (DESYREL) 50 MG tablet Take 25 mg by mouth at bedtime.   vitamin  B-12 (CYANOCOBALAMIN) 1000 MCG tablet Take 1,000 mcg by mouth in the morning. (0600)   ziprasidone (GEODON) 20 MG capsule Take 20 mg by mouth at bedtime. (2100)    Past Medical History:  Diagnosis Date   Anal fissure    Bipolar affective disorder ([place])    CKD (chronic kidney disease)    Dr Holley Raring [name] 4   Colon polyps    Diverticulitis    Diverticulitis    Family history of adverse reaction to anesthesia    mom - PONV   GERD (gastroesophageal reflux disease)    Headache    migraines - none over 10 yrs   Heart murmur    History of hiatal hernia    Hypertension    Pancreatitis    Valproic acid   Vertigo     Past Surgical  History:  Procedure Laterality Date   A/V FISTULAGRAM Left 04/14/2017   Procedure: A/V Fistulagram;  Surgeon: Katha Cabal, MD;  Location: [facility];  Service: Cardiovascular;  Laterality: Left;   A/V FISTULAGRAM Left 06/09/2017   Procedure: A/V FISTULAGRAM;  Surgeon: Katha Cabal, MD;  Location: [place] [facility];  Service: Cardiovascular;  Laterality: Left;   A/V FISTULAGRAM Left 11/02/2017   Procedure: A/V FISTULAGRAM;  Surgeon: Algernon Huxley, MD;  Location: [place] [facility];  Service: Cardiovascular;  Laterality: Left;   A/V FISTULAGRAM Left 12/22/2018   Procedure: A/V FISTULAGRAM;  Surgeon: Katha Cabal, MD;  Location: [place];  Service: Cardiovascular;  Laterality: Left;   A/V FISTULAGRAM Left 06/12/2020   Procedure: A/V FISTULAGRAM;  Surgeon: Katha Cabal, MD;  Location: [place] [facility];  Service: Cardiovascular;  Laterality: Left;   A/V FISTULAGRAM Left 01/22/2021   Procedure: A/V FISTULAGRAM;  Surgeon: Katha Cabal, MD;  Location: [place] [facility];  Service: Cardiovascular;  Laterality: Left;   A/V SHUNT INTERVENTION N/A 11/02/2017   Procedure: A/V SHUNT INTERVENTION;  Surgeon: Algernon Huxley, MD;  Location: [facility] [facility];  Service: Cardiovascular;  Laterality: N/A;   ABDOMINAL HYSTERECTOMY  1990 ?   AV FISTULA PLACEMENT  4/30   BREAST EXCISIONAL BIOPSY Left 1994   neg surgical bx   CHOLECYSTECTOMY  2003   COLONOSCOPY  2014   Dr. Jamal Collin   COLONOSCOPY WITH PROPOFOL N/A 09/24/2015   Procedure: COLONOSCOPY WITH random colon byopies.;  Surgeon: Lucilla Lame, MD;  Location: [place];  Service: Endoscopy;  Laterality: N/A;   ESOPHAGOGASTRODUODENOSCOPY (EGD) WITH PROPOFOL N/A 09/24/2015   Procedure: ESOPHAGOGASTRODUODENOSCOPY (EGD) ;  Surgeon: Lucilla Lame, MD;  Location: [place];  Service: Endoscopy;  Laterality: N/A;   ESOPHAGOGASTRODUODENOSCOPY (EGD) WITH PROPOFOL N/A 12/11/2020   Procedure:  ESOPHAGOGASTRODUODENOSCOPY (EGD) WITH PROPOFOL;  Surgeon: Lucilla Lame, MD;  Location: [facility];  Service: Endoscopy;  Laterality: N/A;   EYE SURGERY     PERIPHERAL VASCULAR CATHETERIZATION N/A 05/29/2015   Procedure: A/V Shuntogram/Fistulagram;  Surgeon: Katha Cabal, MD;  Location: [place] [facility];  Service: Cardiovascular;  Laterality: N/A;   PERIPHERAL VASCULAR CATHETERIZATION N/A 05/29/2015   Procedure: A/V Shunt Intervention;  Surgeon: Katha Cabal, MD;  Location: [place] [facility];  Service: Cardiovascular;  Laterality: N/A;   POLYPECTOMY  09/24/2015   Procedure: POLYPECTOMY INTESTINAL;  Surgeon: Lucilla Lame, MD;  Location: [place];  Service: Endoscopy;;  cecal polyp ascending polyp    Social History Social History   Tobacco Use   Smoking status: Never   Smokeless  tobacco: Never  Vaping Use   Vaping Use: Never used  Substance Use Topics   Alcohol use: No    Alcohol/week: 0.0 standard drinks   Drug use: No    Family History Family History  Problem Relation Age of Onset   Stroke Father    Hypertension Father    Breast cancer Other    Colon cancer Neg Hx    Liver disease Neg Hx    Mental illness Neg Hx     Allergies  Allergen Reactions   Morphine And Related Shortness Of Breath    Pt reports chest pain and difficulty breathing.   Gabapentin     PER MAR   Indomethacin Hives   Penicillins Other (See Comments)    PER MAR   Pollen Extract Other (See Comments)    Sinus problems and HA     REVIEW OF SYSTEMS (Negative unless checked)  Constitutional: [] Weight loss  [] Fever  [] Chills Cardiac: [] Chest pain   [] Chest pressure   [] Palpitations   [] Shortness of breath when laying flat   [] Shortness of breath with exertion. Vascular:  [] Pain in legs with walking   [] Pain in legs at rest  [] History of DVT   [] Phlebitis   [] Swelling in legs   [] Varicose veins   [] Non-healing ulcers Pulmonary:   [] Uses home oxygen   [] Productive cough    [] Hemoptysis   [] Wheeze  [] COPD   [] Asthma Neurologic:  [] Dizziness   [] Seizures   [] History of stroke   [] History of TIA  [] Aphasia   [] Vissual changes   [] Weakness or numbness in arm   [] Weakness or numbness in leg Musculoskeletal:   [] Joint swelling   [] Joint pain   [] Low back pain Hematologic:  [] Easy bruising  [] Easy bleeding   [] Hypercoagulable state   [] Anemic Gastrointestinal:  [] Diarrhea   [] Vomiting  [] Gastroesophageal reflux/heartburn   [] Difficulty swallowing. Genitourinary:  [x] Chronic kidney disease   [] Difficult urination  [] Frequent urination   [] Blood in urine Skin:  [] Rashes   [] Ulcers  Psychological:  [] History of anxiety   []  History of major depression.  Physical Examination  Vitals:   07/02/21 1353  BP: (!) 128/44  Pulse: 60  Resp: 18  Temp: 98 F (36.7 C)  TempSrc: Oral  SpO2: 97%  Weight: 64.9 kg  Height: 5\' 3"  (1.6 m)   Body mass index is 25.33 kg/m. Gen: WD/WN, NAD Head: [place]/AT, No temporalis wasting.  Ear/Nose/Throat: Hearing grossly intact, nares w/o erythema or drainage Eyes: PER, EOMI, sclera nonicteric.  Neck: Supple, no gross masses or lesions.  No JVD.  Pulmonary:  Good air movement, no audible wheezing, no use of accessory muscles.  Cardiac: RRR, precordium non-hyperdynamic. Vascular:    Left brachiocephalic feeling fistula good thrill good bruit Vessel Right Left  Radial Palpable Trace palpable  Brachial Palpable Palpable  Gastrointestinal: soft, non-distended. No guarding/no peritoneal signs.  Musculoskeletal: M/S 5/5 throughout.  No deformity.  Neurologic: CN 2-12 intact. Pain and light touch intact in extremities.  Symmetrical.  Speech is fluent. Motor exam as listed above. Psychiatric: Judgment intact, Mood & affect appropriate for pt's clinical situation. Dermatologic: No rashes or ulcers noted.  No changes consistent with cellulitis.   CBC Lab Results  Component Value Date   WBC 16.9 (H) 12/14/2020   HGB 12.6 12/14/2020   HCT  38.7 12/14/2020   MCV 102.1 (H) 12/14/2020   PLT 189 12/14/2020    BMET    Component Value Date/Time   NA 138 12/14/2020 1208  NA 143 10/23/2020 0930   NA 139 11/09/2014 1027   K 3.8 12/14/2020 1208   K 4.7 11/09/2014 1027   CL 99 12/14/2020 1208   CL 108 11/09/2014 1027   CO2 28 12/14/2020 1208   CO2 25 11/09/2014 1027   GLUCOSE 123 (H) 12/14/2020 1208   GLUCOSE 100 (H) 11/09/2014 1027   BUN 13 12/14/2020 1208   BUN 24 10/23/2020 0930   BUN 51 (H) 11/09/2014 1027   CREATININE 3.60 (H) 12/14/2020 1208   CREATININE 2.98 (H) 11/09/2014 1027   CALCIUM 9.3 12/14/2020 1208   CALCIUM 8.9 11/09/2014 1027   GFRNONAA 13 (L) 12/14/2020 1208   GFRNONAA 15 (L) 11/09/2014 1027   GFRAA 11 (L) 05/24/2019 0513   GFRAA 18 (L) 11/09/2014 1027   CrCl cannot be calculated (Patient's most recent lab result is older than the maximum 21 days allowed.).  COAG Lab Results  Component Value Date   INR 1.08 10/03/2016   INR 1.0 11/09/2014    Radiology No results found.   Assessment/Plan 1.  Complication dialysis device with hand pain and possible steal syndrome:  Patient's left arm dialysis access is possibly complicated by steal syndrome given her complaint of hand pain particularly while on dialysis. The patient will undergo left arm angiography using interventional techniques.  The hope is for intervention to improve distal flow. 2.  End-stage renal disease requiring hemodialysis:  Patient will continue dialysis therapy without interruption. Dialysis has already been arranged. 3.  Hypertension:  Patient will continue medical management; nephrology is following no changes in oral medications. 4.  GERD:  Continue PPI as already ordered, this medication has been reviewed and there are no changes at this time.  Avoidence of caffeine and alcohol  Moderate elevation of the head of the bed       [name], MD  07/02/2021 2:29 PM

## 2021-07-02 NOTE — Interval H&P Note (Signed)
History and Physical Interval Note:  07/02/2021 2:37 PM  Joyce [name]  has presented today for surgery, with the diagnosis of LUE Angio   End Stage Renal.  The various methods of treatment have been discussed with the patient and family. After consideration of risks, benefits and other options for treatment, the patient has consented to  Procedure(s): UPPER EXTREMITY ANGIOGRAPHY (Left) as a surgical intervention.  The patient's history has been reviewed, patient examined, no change in status, stable for surgery.  I have reviewed the patient's chart and labs.  Questions were answered to the patient's satisfaction.     [name]

## 2021-07-03 ENCOUNTER — Encounter: Payer: Self-pay | Admitting: Vascular Surgery

## 2021-07-03 NOTE — Op Note (Signed)
[facility]  Percutaneous Study/Intervention Procedural Note   Date of Surgery: 07/03/2021,10:21 AM  Surgeon:Abdelaziz Westenberger, Dolores Lory   Pre-operative Diagnosis: Left hand pain; steal syndrome secondary to AV dialysis access  Post-operative diagnosis:  Same  Procedure(s) Performed:  1.  Arch aortogram  2.  Left upper extremity angiography third order catheter placement  3.  Ultrasound-guided access to the right common femoral artery  4.  StarClose right common femoral artery   Anesthesia: Conscious sedation was administered by the interventional radiology RN under my direct supervision. IV Versed plus fentanyl were utilized. Continuous ECG, pulse oximetry and blood pressure was monitored throughout the entire procedure.  Conscious sedation was administered for a total of 37 minutes and 48 seconds.  Sheath: 5 French 11 cm Pinnacle sheath right common femoral retrograde  Contrast: 45 cc   Fluoroscopy Time: 3.2 minutes  Indications:  The patient presents to [facility] with left hand pain that is significantly worse with dialysis.  Left radial and ulnar pulses are nonpalpable suggesting possible steal syndrome.  The risks and benefits as well as alternative therapies for left upper extremity revascularization are reviewed with the patient all questions are answered the patient agrees to proceed.  The patient is therefore undergoing angiography with the hope for intervention for limb salvage.   Procedure:  Joyce [name] a 75 y.o. female who was identified and appropriate procedural time out was performed.  The patient was then placed supine on the table and prepped and draped in the usual sterile fashion.  Ultrasound was used to evaluate the right common femoral artery.  It was echolucent and pulsatile indicating it is patent .  An ultrasound image was acquired for the permanent record.  A micropuncture needle was used to access the right common femoral artery  under direct ultrasound guidance.  The microwire was then advanced under fluoroscopic guidance without difficulty followed by the micro-sheath.  A 0.035 J wire was advanced without resistance and a 5Fr sheath was placed.    Pigtail catheter was then advanced to the level of the ascending aorta and an LAO projection of the aortic arch was obtained. Pigtail catheter was then exchanged for an H1 catheter and a stiff angled Glidewire and the left subclavian was selected.  Stiff angled Glidewire was advanced H1 catheter was removed and a 6 French 80 cm penumbra sheath was advanced over the wire and positioned with its tip in the proximal brachial artery.  Distal runoff was then performed.  After review of the images the catheter was removed over wire and an RAO view of the groin was obtained. StarClose device was deployed without difficulty.   Findings:   Aortogram: Arch aortogram demonstrates a normal arch the great vessels are widely patent at their origins  Left Upper Extremity:   The subclavian axillary and brachial arteries are widely patent.  AV fistula is noted.  With injection of contrast in the proximal brachial artery there is virtually no filling of the distal brachial artery and nonvisualization of the radial and ulnar arteries.  This is consistent with steal syndrome.  The catheter is advanced into the proximal radial and then subsequently repositioned into the ulnar where hand-injection of contrast was used to demonstrate the forearm and hand anatomy.  There were 1 or 2 mild to moderate irregularities and the ulnar palmar arch is intact radial artery is patent without hemodynamically significant stenoses.  Summary: No hemodynamically significant stenosis was identified.  Steal does appear to be present  in this would likely be treated with open surgical banding of the existing fistula.   Disposition: Patient was taken to the recovery room in stable condition having tolerated the procedure  well.  Joyce [name] 07/03/2021,10:21 AM

## 2021-07-25 ENCOUNTER — Ambulatory Visit (INDEPENDENT_AMBULATORY_CARE_PROVIDER_SITE_OTHER): Payer: Medicare HMO | Admitting: Vascular Surgery

## 2021-07-25 ENCOUNTER — Other Ambulatory Visit: Payer: Self-pay

## 2021-07-25 ENCOUNTER — Encounter (INDEPENDENT_AMBULATORY_CARE_PROVIDER_SITE_OTHER): Payer: Self-pay | Admitting: Vascular Surgery

## 2021-07-25 VITALS — BP 95/56 | HR 60 | Resp 15 | Wt 147.0 lb

## 2021-07-25 DIAGNOSIS — K219 Gastro-esophageal reflux disease without esophagitis: Secondary | ICD-10-CM | POA: Diagnosis not present

## 2021-07-25 DIAGNOSIS — N186 End stage renal disease: Secondary | ICD-10-CM | POA: Diagnosis not present

## 2021-07-25 DIAGNOSIS — T829XXS Unspecified complication of cardiac and vascular prosthetic device, implant and graft, sequela: Secondary | ICD-10-CM | POA: Diagnosis not present

## 2021-07-25 DIAGNOSIS — I10 Essential (primary) hypertension: Secondary | ICD-10-CM

## 2021-07-25 DIAGNOSIS — Z992 Dependence on renal dialysis: Secondary | ICD-10-CM

## 2021-07-28 ENCOUNTER — Encounter (INDEPENDENT_AMBULATORY_CARE_PROVIDER_SITE_OTHER): Payer: Self-pay | Admitting: Vascular Surgery

## 2021-07-28 NOTE — H&P (View-Only) (Signed)
MRN : [id number]  Joyce [name] is a 76 y.o. (1946-02-14) female who presents with chief complaint of check access.  History of Present Illness:   The patient returns to the office for follow up regarding problem with the dialysis access. Currently the patient is maintained via a right IJ tunneled catheter.  The patient has had multiple failed upper extremity accesses.  The patient notes a significant increase in bleeding time after decannulation.  The patient continues to have severe hand pain consistent with steal phenomena.  Recent angiogram is consistent with severe steal syndrome without stricture of the arterial system.  No significant arm swelling.  The patient denies redness or swelling at the access site. The patient denies fever or chills at home or while on dialysis.  The patient denies amaurosis fugax or recent TIA symptoms. There are no recent neurological changes noted. The patient denies claudication symptoms or rest pain symptoms. The patient denies history of DVT, PE or superficial thrombophlebitis. The patient denies recent episodes of angina or shortness of breath.     Current Meds  Medication Sig   acetaminophen (TYLENOL) 500 MG tablet Take 1,000 mg by mouth daily as needed for moderate pain.   brimonidine (ALPHAGAN) 0.2 % ophthalmic solution Place 1 drop into both eyes 2 (two) times daily. (Patient taking differently: Place 1 drop into both eyes 2 (two) times daily. (0600 & 2000))   calcium acetate (PHOSLO) 667 MG capsule Take 667 mg by mouth 3 (three) times daily with meals.   carbamazepine (TEGRETOL) 100 MG chewable tablet Chew 100 mg by mouth 2 (two) times daily. (0600 & 2100)   Carboxymethylcellulose Sod PF 1 % GEL Place 3 drops into the right eye 4 (four) times daily.   Dexlansoprazole 30 MG capsule Take 30 mg by mouth daily. (0630)   diclofenac Sodium (VOLTAREN) 1 % GEL Apply 2 g topically 3 (three) times daily as needed (pain).   dicyclomine (BENTYL) 20  MG tablet Take 20 mg by mouth 3 (three) times daily after meals.   famotidine (PEPCID) 40 MG tablet Take 40 mg by mouth at bedtime. (2000)   fluticasone (FLONASE) 50 MCG/ACT nasal spray Place 1 spray into both nostrils in the morning. (0700)   haloperidol (HALDOL) 5 MG tablet TAKE 1/2 TABLET THREE TIMES WEEKLY ON MONDAY, WEDNESDAY AND FRIDAY  MORNING   lidocaine-prilocaine (EMLA) cream APPLY TO ACCESS AREA 30 MINUTES BEFORE DIALYSIS MONDAY / WEDNESDAY / FRIDAY   loratadine (CLARITIN) 10 MG tablet Take 10 mg by mouth daily.   midodrine (PROAMATINE) 10 MG tablet Take 1 tablet (10 mg total) by mouth every Monday, Wednesday, and Friday with hemodialysis. May take an additional doses as needed for SBP<100. (Patient taking differently: Take 10 mg by mouth daily.)   montelukast (SINGULAIR) 10 MG tablet Take 10 mg by mouth daily.   Netarsudil-Latanoprost (ROCKLATAN) 0.02-0.005 % SOLN INSTILL 1 DROP INTO EACH EYE AT BEDTIME   ondansetron (ZOFRAN) 4 MG tablet Take 4 mg by mouth every 6 (six) hours as needed for nausea or vomiting.   rOPINIRole (REQUIP) 2 MG tablet Take 2 mg by mouth at bedtime.   timolol (TIMOPTIC) 0.5 % ophthalmic solution Place 1 drop into both eyes 2 (two) times daily.   traMADol (ULTRAM) 50 MG tablet Take 1 tablet (50 mg total) by mouth every 6 (six) hours as needed.   traZODone (DESYREL) 50 MG tablet Take 25 mg by mouth at bedtime.   vitamin B-12 (CYANOCOBALAMIN) 1000 MCG tablet  Take 1,000 mcg by mouth in the morning. (0600)   ziprasidone (GEODON) 20 MG capsule Take 20 mg by mouth at bedtime. (2100)    Past Medical History:  Diagnosis Date   Anal fissure    Bipolar affective disorder ([place])    CKD (chronic kidney disease)    Dr Holley Raring [name] 4   Colon polyps    Diverticulitis    Diverticulitis    Family history of adverse reaction to anesthesia    mom - PONV   GERD (gastroesophageal reflux disease)    Headache    migraines - none over 10 yrs   Heart murmur    History of  hiatal hernia    Hypertension    Pancreatitis    Valproic acid   Vertigo     Past Surgical History:  Procedure Laterality Date   A/V FISTULAGRAM Left 04/14/2017   Procedure: A/V Fistulagram;  Surgeon: Katha Cabal, MD;  Location: [place] [facility];  Service: Cardiovascular;  Laterality: Left;   A/V FISTULAGRAM Left 06/09/2017   Procedure: A/V FISTULAGRAM;  Surgeon: Katha Cabal, MD;  Location: [place] [facility];  Service: Cardiovascular;  Laterality: Left;   A/V FISTULAGRAM Left 11/02/2017   Procedure: A/V FISTULAGRAM;  Surgeon: Algernon Huxley, MD;  Location: [place] [facility];  Service: Cardiovascular;  Laterality: Left;   A/V FISTULAGRAM Left 12/22/2018   Procedure: A/V FISTULAGRAM;  Surgeon: Katha Cabal, MD;  Location: [place] [facility];  Service: Cardiovascular;  Laterality: Left;   A/V FISTULAGRAM Left 06/12/2020   Procedure: A/V FISTULAGRAM;  Surgeon: Katha Cabal, MD;  Location: [place] [facility];  Service: Cardiovascular;  Laterality: Left;   A/V FISTULAGRAM Left 01/22/2021   Procedure: A/V FISTULAGRAM;  Surgeon: Katha Cabal, MD;  Location: [place] [facility];  Service: Cardiovascular;  Laterality: Left;   A/V SHUNT INTERVENTION N/A 11/02/2017   Procedure: A/V SHUNT INTERVENTION;  Surgeon: Algernon Huxley, MD;  Location: [place] [facility];  Service: Cardiovascular;  Laterality: N/A;   ABDOMINAL HYSTERECTOMY  1990 ?   AV FISTULA PLACEMENT  4/30   BREAST EXCISIONAL BIOPSY Left 1994   neg surgical bx   CHOLECYSTECTOMY  2003   COLONOSCOPY  2014   Dr. Jamal Collin   COLONOSCOPY WITH PROPOFOL N/A 09/24/2015   Procedure: COLONOSCOPY WITH random colon byopies.;  Surgeon: Lucilla Lame, MD;  Location: [place];  Service: Endoscopy;  Laterality: N/A;   ESOPHAGOGASTRODUODENOSCOPY (EGD) WITH PROPOFOL N/A 09/24/2015   Procedure: ESOPHAGOGASTRODUODENOSCOPY (EGD) ;  Surgeon: Lucilla Lame, MD;  Location: [place];  Service: Endoscopy;   Laterality: N/A;   ESOPHAGOGASTRODUODENOSCOPY (EGD) WITH PROPOFOL N/A 12/11/2020   Procedure: ESOPHAGOGASTRODUODENOSCOPY (EGD) WITH PROPOFOL;  Surgeon: Lucilla Lame, MD;  Location: [facility];  Service: Endoscopy;  Laterality: N/A;   EYE SURGERY     PERIPHERAL VASCULAR CATHETERIZATION N/A 05/29/2015   Procedure: A/V Shuntogram/Fistulagram;  Surgeon: Katha Cabal, MD;  Location: [place] [facility];  Service: Cardiovascular;  Laterality: N/A;   PERIPHERAL VASCULAR CATHETERIZATION N/A 05/29/2015   Procedure: A/V Shunt Intervention;  Surgeon: Katha Cabal, MD;  Location: [place] [facility];  Service: Cardiovascular;  Laterality: N/A;   POLYPECTOMY  09/24/2015   Procedure: POLYPECTOMY INTESTINAL;  Surgeon: Lucilla Lame, MD;  Location: [place];  Service: Endoscopy;;  cecal polyp ascending polyp   UPPER EXTREMITY ANGIOGRAPHY Left 07/02/2021   Procedure: UPPER EXTREMITY ANGIOGRAPHY;  Surgeon: Katha Cabal, MD;  Location: [place] [facility];  Service: Cardiovascular;  Laterality: Left;    Social History Social History   Tobacco Use   Smoking status: Never   Smokeless tobacco: Never  Vaping Use   Vaping Use: Never used  Substance Use Topics   Alcohol use: No    Alcohol/week: 0.0 standard drinks   Drug use: No    Family History Family History  Problem Relation Age of Onset   Stroke Father    Hypertension Father    Breast cancer Other    Colon cancer Neg Hx    Liver disease Neg Hx    Mental illness Neg Hx     Allergies  Allergen Reactions   Morphine And Related Shortness Of Breath    Pt reports chest pain and difficulty breathing.   Gabapentin     PER MAR   Indomethacin Hives   Penicillins Other (See Comments)    PER MAR   Pollen Extract Other (See Comments)    Sinus problems and HA     REVIEW OF SYSTEMS (Negative unless checked)  Constitutional: [] Weight loss  [] Fever  [] Chills Cardiac: [] Chest pain   [] Chest pressure   [] Palpitations    [] Shortness of breath when laying flat   [] Shortness of breath with exertion. Vascular:  [] Pain in legs with walking   [] Pain in legs at rest  [] History of DVT   [] Phlebitis   [] Swelling in legs   [] Varicose veins   [] Non-healing ulcers Pulmonary:   [] Uses home oxygen   [] Productive cough   [] Hemoptysis   [] Wheeze  [] COPD   [] Asthma Neurologic:  [] Dizziness   [] Seizures   [] History of stroke   [] History of TIA  [] Aphasia   [] Vissual changes   [] Weakness or numbness in arm   [] Weakness or numbness in leg Musculoskeletal:   [] Joint swelling   [] Joint pain   [] Low back pain Hematologic:  [] Easy bruising  [] Easy bleeding   [] Hypercoagulable state   [] Anemic Gastrointestinal:  [] Diarrhea   [] Vomiting  [x] Gastroesophageal reflux/heartburn   [] Difficulty swallowing. Genitourinary:  [x] Chronic kidney disease   [] Difficult urination  [] Frequent urination   [] Blood in urine Skin:  [] Rashes   [] Ulcers  Psychological:  [x] History of anxiety   [x]  History of major depression.  Physical Examination  Vitals:   07/25/21 1402  BP: (!) 95/56  Pulse: 60  Resp: 15  Weight: 147 lb (66.7 kg)   Body mass index is 26.04 kg/m. Gen: WD/WN, NAD Head: [place]/AT, No temporalis wasting.  Ear/Nose/Throat: Hearing grossly intact, nares w/o erythema or drainage Eyes: PER, EOMI, sclera nonicteric.  Neck: Supple, no gross masses or lesions.  No JVD.  Pulmonary:  Good air movement, no audible wheezing, no use of accessory muscles.  Cardiac: RRR, precordium non-hyperdynamic. Vascular:    left brachial cephalic fistula with moderate pulsatility.  Large almost aneurysmal portion at the level of the anastomosis  Vessel Right Left  Radial Palpable Not Palpable  Brachial Palpable Palpable  Gastrointestinal: soft, non-distended. No guarding/no peritoneal signs.  Musculoskeletal: M/S 5/5 throughout.  No deformity.  Neurologic: CN 2-12 intact. Pain and light touch intact in extremities.  Symmetrical.  Speech is fluent. Motor exam  as listed above. Psychiatric: Judgment intact, Mood & affect appropriate for pt's clinical situation. Dermatologic: No rashes or ulcers noted.  No changes consistent with cellulitis.   CBC Lab Results  Component Value Date   WBC 16.9 (H) 12/14/2020   HGB 12.6 12/14/2020   HCT 38.7 12/14/2020   MCV 102.1 (H) 12/14/2020  PLT 189 12/14/2020    BMET    Component Value Date/Time   NA 138 12/14/2020 1208   NA 143 10/23/2020 0930   NA 139 11/09/2014 1027   K 3.8 12/14/2020 1208   K 4.7 11/09/2014 1027   CL 99 12/14/2020 1208   CL 108 11/09/2014 1027   CO2 28 12/14/2020 1208   CO2 25 11/09/2014 1027   GLUCOSE 123 (H) 12/14/2020 1208   GLUCOSE 100 (H) 11/09/2014 1027   BUN 13 12/14/2020 1208   BUN 24 10/23/2020 0930   BUN 51 (H) 11/09/2014 1027   CREATININE 3.60 (H) 12/14/2020 1208   CREATININE 2.98 (H) 11/09/2014 1027   CALCIUM 9.3 12/14/2020 1208   CALCIUM 8.9 11/09/2014 1027   GFRNONAA 13 (L) 12/14/2020 1208   GFRNONAA 15 (L) 11/09/2014 1027   GFRAA 11 (L) 05/24/2019 0513   GFRAA 18 (L) 11/09/2014 1027   CrCl cannot be calculated (Patient's most recent lab result is older than the maximum 21 days allowed.).  COAG Lab Results  Component Value Date   INR 1.08 10/03/2016   INR 1.0 11/09/2014    Radiology PERIPHERAL VASCULAR CATHETERIZATION  Result Date: 07/02/2021 See surgical note for result.    Assessment/Plan 1. ESRD on dialysis [facility]) Recommend:  At this time the patient does not have appropriate extremity access for dialysis and she has symptoms consistent with steal syndrome  Patient should have a revision of her brachial cephalic fistula with banding to improve her hand pain.  Also intervention of the distal radial artery will be considered.  The risks, benefits and alternative therapies were reviewed in detail with the patient.  All questions were answered.  The patient agrees to proceed with surgery.    2. Complication of vascular access for  dialysis, sequela See #1  3. Primary hypertension Continue antihypertensive medications as already ordered, these medications have been reviewed and there are no changes at this time.   4. Gastro-esophageal reflux disease without esophagitis Continue PPI as already ordered, this medication has been reviewed and there are no changes at this time.  Avoidence of caffeine and alcohol  Moderate elevation of the head of the bed      [name], MD  07/28/2021 12:24 PM

## 2021-07-28 NOTE — Progress Notes (Signed)
MRN : [id number]  Joyce [name] is a 76 y.o. ([birth year]) female who presents with chief complaint of check access.  History of Present Illness:   The patient returns to the office for follow up regarding problem with the dialysis access. Currently the patient is maintained via a right IJ tunneled catheter.  The patient has had multiple failed upper extremity accesses.  The patient notes a significant increase in bleeding time after decannulation.  The patient continues to have severe hand pain consistent with steal phenomena.  Recent angiogram is consistent with severe steal syndrome without stricture of the arterial system.  No significant arm swelling.  The patient denies redness or swelling at the access site. The patient denies fever or chills at home or while on dialysis.  The patient denies amaurosis fugax or recent TIA symptoms. There are no recent neurological changes noted. The patient denies claudication symptoms or rest pain symptoms. The patient denies history of DVT, PE or superficial thrombophlebitis. The patient denies recent episodes of angina or shortness of breath.     Current Meds  Medication Sig   acetaminophen (TYLENOL) 500 MG tablet Take 1,000 mg by mouth daily as needed for moderate pain.   brimonidine (ALPHAGAN) 0.2 % ophthalmic solution Place 1 drop into both eyes 2 (two) times daily. (Patient taking differently: Place 1 drop into both eyes 2 (two) times daily. (0600 & 2000))   calcium acetate (PHOSLO) 667 MG capsule Take 667 mg by mouth 3 (three) times daily with meals.   carbamazepine (TEGRETOL) 100 MG chewable tablet Chew 100 mg by mouth 2 (two) times daily. (0600 & 2100)   Carboxymethylcellulose Sod PF 1 % GEL Place 3 drops into the right eye 4 (four) times daily.   Dexlansoprazole 30 MG capsule Take 30 mg by mouth daily. (0630)   diclofenac Sodium (VOLTAREN) 1 % GEL Apply 2 g topically 3 (three) times daily as needed (pain).   dicyclomine (BENTYL) 20  MG tablet Take 20 mg by mouth 3 (three) times daily after meals.   famotidine (PEPCID) 40 MG tablet Take 40 mg by mouth at bedtime. (2000)   fluticasone (FLONASE) 50 MCG/ACT nasal spray Place 1 spray into both nostrils in the morning. (0700)   haloperidol (HALDOL) 5 MG tablet TAKE 1/2 TABLET THREE TIMES WEEKLY ON MONDAY, WEDNESDAY AND FRIDAY  MORNING   lidocaine-prilocaine (EMLA) cream APPLY TO ACCESS AREA 30 MINUTES BEFORE DIALYSIS MONDAY / WEDNESDAY / FRIDAY   loratadine (CLARITIN) 10 MG tablet Take 10 mg by mouth daily.   midodrine (PROAMATINE) 10 MG tablet Take 1 tablet (10 mg total) by mouth every Monday, Wednesday, and Friday with hemodialysis. May take an additional doses as needed for SBP<100. (Patient taking differently: Take 10 mg by mouth daily.)   montelukast (SINGULAIR) 10 MG tablet Take 10 mg by mouth daily.   Netarsudil-Latanoprost (ROCKLATAN) 0.02-0.005 % SOLN INSTILL 1 DROP INTO EACH EYE AT BEDTIME   ondansetron (ZOFRAN) 4 MG tablet Take 4 mg by mouth every 6 (six) hours as needed for nausea or vomiting.   rOPINIRole (REQUIP) 2 MG tablet Take 2 mg by mouth at bedtime.   timolol (TIMOPTIC) 0.5 % ophthalmic solution Place 1 drop into both eyes 2 (two) times daily.   traMADol (ULTRAM) 50 MG tablet Take 1 tablet (50 mg total) by mouth every 6 (six) hours as needed.   traZODone (DESYREL) 50 MG tablet Take 25 mg by mouth at bedtime.   vitamin B-12 (CYANOCOBALAMIN) 1000 MCG tablet  Take 1,000 mcg by mouth in the morning. (0600)   ziprasidone (GEODON) 20 MG capsule Take 20 mg by mouth at bedtime. (2100)    Past Medical History:  Diagnosis Date   Anal fissure    Bipolar affective disorder ([place])    CKD (chronic kidney disease)    Dr Holley Raring [name] 4   Colon polyps    Diverticulitis    Diverticulitis    Family history of adverse reaction to anesthesia    mom - PONV   GERD (gastroesophageal reflux disease)    Headache    migraines - none over 10 yrs   Heart murmur    History of  hiatal hernia    Hypertension    Pancreatitis    Valproic acid   Vertigo     Past Surgical History:  Procedure Laterality Date   A/V FISTULAGRAM Left 04/14/2017   Procedure: A/V Fistulagram;  Surgeon: Katha Cabal, MD;  Location: [place] [facility];  Service: Cardiovascular;  Laterality: Left;   A/V FISTULAGRAM Left 06/09/2017   Procedure: A/V FISTULAGRAM;  Surgeon: Katha Cabal, MD;  Location: [place] [facility];  Service: Cardiovascular;  Laterality: Left;   A/V FISTULAGRAM Left 11/02/2017   Procedure: A/V FISTULAGRAM;  Surgeon: Algernon Huxley, MD;  Location: [place] [facility];  Service: Cardiovascular;  Laterality: Left;   A/V FISTULAGRAM Left 12/22/2018   Procedure: A/V FISTULAGRAM;  Surgeon: Katha Cabal, MD;  Location: [place] [facility];  Service: Cardiovascular;  Laterality: Left;   A/V FISTULAGRAM Left 06/12/2020   Procedure: A/V FISTULAGRAM;  Surgeon: Katha Cabal, MD;  Location: [place] [facility];  Service: Cardiovascular;  Laterality: Left;   A/V FISTULAGRAM Left 01/22/2021   Procedure: A/V FISTULAGRAM;  Surgeon: Katha Cabal, MD;  Location: [place] [facility];  Service: Cardiovascular;  Laterality: Left;   A/V SHUNT INTERVENTION N/A 11/02/2017   Procedure: A/V SHUNT INTERVENTION;  Surgeon: Algernon Huxley, MD;  Location: [place] [facility];  Service: Cardiovascular;  Laterality: N/A;   ABDOMINAL HYSTERECTOMY  1990 ?   AV FISTULA PLACEMENT  4/30   BREAST EXCISIONAL BIOPSY Left 1994   neg surgical bx   CHOLECYSTECTOMY  2003   COLONOSCOPY  2014   Dr. Jamal Collin   COLONOSCOPY WITH PROPOFOL N/A 09/24/2015   Procedure: COLONOSCOPY WITH random colon byopies.;  Surgeon: Lucilla Lame, MD;  Location: [place];  Service: Endoscopy;  Laterality: N/A;   ESOPHAGOGASTRODUODENOSCOPY (EGD) WITH PROPOFOL N/A 09/24/2015   Procedure: ESOPHAGOGASTRODUODENOSCOPY (EGD) ;  Surgeon: Lucilla Lame, MD;  Location: [place];  Service: Endoscopy;   Laterality: N/A;   ESOPHAGOGASTRODUODENOSCOPY (EGD) WITH PROPOFOL N/A 12/11/2020   Procedure: ESOPHAGOGASTRODUODENOSCOPY (EGD) WITH PROPOFOL;  Surgeon: Lucilla Lame, MD;  Location: [place] [facility];  Service: Endoscopy;  Laterality: N/A;   EYE SURGERY     PERIPHERAL VASCULAR CATHETERIZATION N/A 05/29/2015   Procedure: A/V Shuntogram/Fistulagram;  Surgeon: Katha Cabal, MD;  Location: [place] [facility];  Service: Cardiovascular;  Laterality: N/A;   PERIPHERAL VASCULAR CATHETERIZATION N/A 05/29/2015   Procedure: A/V Shunt Intervention;  Surgeon: Katha Cabal, MD;  Location: [place] [facility];  Service: Cardiovascular;  Laterality: N/A;   POLYPECTOMY  09/24/2015   Procedure: POLYPECTOMY INTESTINAL;  Surgeon: Lucilla Lame, MD;  Location: [place];  Service: Endoscopy;;  cecal polyp ascending polyp   UPPER EXTREMITY ANGIOGRAPHY Left 07/02/2021   Procedure: UPPER EXTREMITY ANGIOGRAPHY;  Surgeon: Katha Cabal, MD;  Location: [place] [facility]  [facility];  Service: Cardiovascular;  Laterality: Left;    Social History Social History   Tobacco Use   Smoking status: Never   Smokeless tobacco: Never  Vaping Use   Vaping Use: Never used  Substance Use Topics   Alcohol use: No    Alcohol/week: 0.0 standard drinks   Drug use: No    Family History Family History  Problem Relation Age of Onset   Stroke Father    Hypertension Father    Breast cancer Other    Colon cancer Neg Hx    Liver disease Neg Hx    Mental illness Neg Hx     Allergies  Allergen Reactions   Morphine And Related Shortness Of Breath    Pt reports chest pain and difficulty breathing.   Gabapentin     PER MAR   Indomethacin Hives   Penicillins Other (See Comments)    PER MAR   Pollen Extract Other (See Comments)    Sinus problems and HA     REVIEW OF SYSTEMS (Negative unless checked)  Constitutional: [] Weight loss  [] Fever  [] Chills Cardiac: [] Chest pain   [] Chest pressure   [] Palpitations    [] Shortness of breath when laying flat   [] Shortness of breath with exertion. Vascular:  [] Pain in legs with walking   [] Pain in legs at rest  [] History of DVT   [] Phlebitis   [] Swelling in legs   [] Varicose veins   [] Non-healing ulcers Pulmonary:   [] Uses home oxygen   [] Productive cough   [] Hemoptysis   [] Wheeze  [] COPD   [] Asthma Neurologic:  [] Dizziness   [] Seizures   [] History of stroke   [] History of TIA  [] Aphasia   [] Vissual changes   [] Weakness or numbness in arm   [] Weakness or numbness in leg Musculoskeletal:   [] Joint swelling   [] Joint pain   [] Low back pain Hematologic:  [] Easy bruising  [] Easy bleeding   [] Hypercoagulable state   [] Anemic Gastrointestinal:  [] Diarrhea   [] Vomiting  [x] Gastroesophageal reflux/heartburn   [] Difficulty swallowing. Genitourinary:  [x] Chronic kidney disease   [] Difficult urination  [] Frequent urination   [] Blood in urine Skin:  [] Rashes   [] Ulcers  Psychological:  [x] History of anxiety   [x]  History of major depression.  Physical Examination  Vitals:   07/25/21 1402  BP: (!) 95/56  Pulse: 60  Resp: 15  Weight: 147 lb (66.7 kg)   Body mass index is 26.04 kg/m. Gen: WD/WN, NAD Head: [place]/AT, No temporalis wasting.  Ear/Nose/Throat: Hearing grossly intact, nares w/o erythema or drainage Eyes: PER, EOMI, sclera nonicteric.  Neck: Supple, no gross masses or lesions.  No JVD.  Pulmonary:  Good air movement, no audible wheezing, no use of accessory muscles.  Cardiac: RRR, precordium non-hyperdynamic. Vascular:    left brachial cephalic fistula with moderate pulsatility.  Large almost aneurysmal portion at the level of the anastomosis  Vessel Right Left  Radial Palpable Not Palpable  Brachial Palpable Palpable  Gastrointestinal: soft, non-distended. No guarding/no peritoneal signs.  Musculoskeletal: M/S 5/5 throughout.  No deformity.  Neurologic: CN 2-12 intact. Pain and light touch intact in extremities.  Symmetrical.  Speech is fluent. Motor exam  as listed above. Psychiatric: Judgment intact, Mood & affect appropriate for pt's clinical situation. Dermatologic: No rashes or ulcers noted.  No changes consistent with cellulitis.   CBC Lab Results  Component Value Date   WBC 16.9 (H) 12/14/2020   HGB 12.6 12/14/2020   HCT 38.7 12/14/2020   MCV 102.1 (H) 12/14/2020  PLT 189 12/14/2020    BMET    Component Value Date/Time   NA 138 12/14/2020 1208   NA 143 10/23/2020 0930   NA 139 11/09/2014 1027   K 3.8 12/14/2020 1208   K 4.7 11/09/2014 1027   CL 99 12/14/2020 1208   CL 108 11/09/2014 1027   CO2 28 12/14/2020 1208   CO2 25 11/09/2014 1027   GLUCOSE 123 (H) 12/14/2020 1208   GLUCOSE 100 (H) 11/09/2014 1027   BUN 13 12/14/2020 1208   BUN 24 10/23/2020 0930   BUN 51 (H) 11/09/2014 1027   CREATININE 3.60 (H) 12/14/2020 1208   CREATININE 2.98 (H) 11/09/2014 1027   CALCIUM 9.3 12/14/2020 1208   CALCIUM 8.9 11/09/2014 1027   GFRNONAA 13 (L) 12/14/2020 1208   GFRNONAA 15 (L) 11/09/2014 1027   GFRAA 11 (L) 05/24/2019 0513   GFRAA 18 (L) 11/09/2014 1027   CrCl cannot be calculated (Patient's most recent lab result is older than the maximum 21 days allowed.).  COAG Lab Results  Component Value Date   INR 1.08 10/03/2016   INR 1.0 11/09/2014    Radiology PERIPHERAL VASCULAR CATHETERIZATION  Result Date: 07/02/2021 See surgical note for result.    Assessment/Plan 1. ESRD on dialysis [facility]) Recommend:  At this time the patient does not have appropriate extremity access for dialysis and she has symptoms consistent with steal syndrome  Patient should have a revision of her brachial cephalic fistula with banding to improve her hand pain.  Also intervention of the distal radial artery will be considered.  The risks, benefits and alternative therapies were reviewed in detail with the patient.  All questions were answered.  The patient agrees to proceed with surgery.    2. Complication of vascular access for  dialysis, sequela See #1  3. Primary hypertension Continue antihypertensive medications as already ordered, these medications have been reviewed and there are no changes at this time.   4. Gastro-esophageal reflux disease without esophagitis Continue PPI as already ordered, this medication has been reviewed and there are no changes at this time.  Avoidence of caffeine and alcohol  Moderate elevation of the head of the bed      [name], MD  07/28/2021 12:24 PM

## 2021-07-29 ENCOUNTER — Telehealth (INDEPENDENT_AMBULATORY_CARE_PROVIDER_SITE_OTHER): Payer: Self-pay

## 2021-07-29 NOTE — Telephone Encounter (Signed)
Spoke with the patient and she is scheduled with Dr. Delana Meyer on 08/02/21 for a revision of left brachial cephalic fistula with a left arm angiogram at the [facility]. Pre-op phone call is on 07/31/21 between 8-1 pm. Pre-surgical instructions were discussed and will be mailed as well as the patient stated she wrote the information down.

## 2021-07-31 ENCOUNTER — Other Ambulatory Visit (INDEPENDENT_AMBULATORY_CARE_PROVIDER_SITE_OTHER): Payer: Self-pay | Admitting: Nurse Practitioner

## 2021-07-31 ENCOUNTER — Other Ambulatory Visit: Payer: Self-pay

## 2021-07-31 ENCOUNTER — Encounter
Admission: RE | Admit: 2021-07-31 | Discharge: 2021-07-31 | Disposition: A | Discharge status: Home / Self Care | Payer: Medicare HMO | Source: Ambulatory Visit | Attending: Vascular Surgery | Admitting: Vascular Surgery

## 2021-07-31 DIAGNOSIS — N186 End stage renal disease: Secondary | ICD-10-CM

## 2021-07-31 NOTE — Clinical Note (Incomplete)
As of this time, this RN is still waiting on the medication list from [facility] at  [phone] to verify patient's current medications. Nancy,the care coordinator will be faxing it to me before the end of the day. Once, that's received the patient instructions can be updated and fax back to facility and patient surgical instructions can be discussed with Izora Gala the care coordinator.   The patient diet status after midnight the night before surgery has been discussed with patient over the phone and patient verbalized understanding. We just to finalized patient instructions with facility.

## 2021-07-31 NOTE — Telephone Encounter (Signed)
Patient called on 07/30/21 to say she was under quarantine due to her room mate testing positive for covid. Patient had a surgery scheduled for 08/02/21 with Dr. Delana Meyer, it has been moved to 08/09/21 to accommodate the quarantine. Pre-surgical instructions regarding the adjusted date will be mailed to the patient.

## 2021-07-31 NOTE — Pre-Procedure Instructions (Signed)
Joyce Sable, MD  Physician Cardiology Progress Notes    Signed Encounter Date:  11/29/2020   Signed      Expand All Collapse All                                                                                                                                                                                                                                                                                                                                                                                                                                                              Cardiology Office Note:     Date:  11/29/2020    ID:  Joyce, [name] [birth year], MRN [id number]   PCP:  Joyce [name], Joyce [name]     Cardiologist:  Joyce Sable, MD  Electrophysiologist:  None    Referring MD: Joyce [name], Joyce Mak*        Chief Complaint  Patient presents with   Other      5 week follow up post Zio and Echo. Patient c.o chest pain in the center of chest through the night. Meds reviewed verbally with patient.       History of Present Illness:     Joyce [name] is a 76 y.o. female with a hx of GERD, hypertension, end-stage renal disease  on dialysis MWF, hemodialysis induced hypotension who presents for follow-up.     She has a history of bradycardia, dizziness and syncope.  Beta-blocker was previously stopped.  Hypotension and dizziness according the context of hemodialysis.  Midodrine 10 mg was started and patient advised to take on dialysis days 15 to 30 minutes prior to HD.  Cardiac monitor was placed at last visit to evaluate any significant arrhythmias as cause for syncope.  She still has low blood pressures after dialysis.  Has some central chest discomfort secondary to indigestion, improved with taking PPI.   Prior  notes Echocardiogram 05/2019 showed normal systolic function, EF 60 to 65%. Echo 01/24/2375 normal systolic function, EF 55 to 60%.  Grade 2 diastolic dysfunction. Cardiac monitor 11/13/2020, no significant arrhythmias to explain syncope, occasional SVTs.       Past Medical History:  Diagnosis Date   Anal fissure     Bipolar affective disorder ([place])     CKD (chronic kidney disease)      Dr Holley Raring [name] 4   Colon polyps     Diverticulitis     Diverticulitis     Family history of adverse reaction to anesthesia      mom - PONV   GERD (gastroesophageal reflux disease)     Headache      migraines - none over 10 yrs   Heart murmur     History of hiatal hernia     Hypertension     Pancreatitis      Valproic acid   Vertigo             Past Surgical History:  Procedure Laterality Date   A/V FISTULAGRAM Left 04/14/2017    Procedure: A/V Fistulagram;  Surgeon: Katha Cabal, MD;  Location: [facility];  Service: Cardiovascular;  Laterality: Left;   A/V FISTULAGRAM Left 06/09/2017    Procedure: A/V FISTULAGRAM;  Surgeon: Katha Cabal, MD;  Location: [facility];  Service: Cardiovascular;  Laterality: Left;   A/V FISTULAGRAM Left 11/02/2017    Procedure: A/V FISTULAGRAM;  Surgeon: Algernon Huxley, MD;  Location: [facility];  Service: Cardiovascular;  Laterality: Left;   A/V FISTULAGRAM Left 12/22/2018    Procedure: A/V FISTULAGRAM;  Surgeon: Katha Cabal, MD;  Location: [facility];  Service: Cardiovascular;  Laterality: Left;   A/V FISTULAGRAM Left 06/12/2020    Procedure: A/V FISTULAGRAM;  Surgeon: Katha Cabal, MD;  Location: [facility];  Service: Cardiovascular;  Laterality: Left;   A/V SHUNT INTERVENTION N/A 11/02/2017    Procedure: A/V SHUNT INTERVENTION;  Surgeon: Algernon Huxley, MD;  Location: [facility];  Service: Cardiovascular;  Laterality: N/A;   ABDOMINAL HYSTERECTOMY   1990 ?   AV FISTULA PLACEMENT    4/30   BREAST EXCISIONAL BIOPSY Left 1994    neg surgical bx   CHOLECYSTECTOMY   2003   COLONOSCOPY   2014    Dr. Jamal Collin   COLONOSCOPY WITH PROPOFOL N/A 09/24/2015    Procedure: COLONOSCOPY WITH random colon byopies.;  Surgeon: Lucilla Lame, MD;  Location: [facility];  Service: Endoscopy;  Laterality: N/A;   ESOPHAGOGASTRODUODENOSCOPY (EGD) WITH PROPOFOL N/A 09/24/2015    Procedure: ESOPHAGOGASTRODUODENOSCOPY (EGD) ;  Surgeon: Lucilla Lame, MD;  Location: [facility];  Service: Endoscopy;  Laterality: N/A;   EYE SURGERY       PERIPHERAL VASCULAR CATHETERIZATION N/A 05/29/2015    Procedure: A/V Shuntogram/Fistulagram;  Surgeon: Belenda Cruise  Eloise Levels, MD;  Location: [facility];  Service: Cardiovascular;  Laterality: N/A;   PERIPHERAL VASCULAR CATHETERIZATION N/A 05/29/2015    Procedure: A/V Shunt Intervention;  Surgeon: Katha Cabal, MD;  Location: [facility];  Service: Cardiovascular;  Laterality: N/A;   POLYPECTOMY   09/24/2015    Procedure: POLYPECTOMY INTESTINAL;  Surgeon: Lucilla Lame, MD;  Location: [facility];  Service: Endoscopy;;  cecal polyp ascending polyp      Current Medications: Active Medications      Current Meds  Medication Sig   acetaminophen (TYLENOL) 500 MG tablet Take 1,000 mg by mouth 2 (two) times daily as needed (pain.).   alum & mag hydroxide-simeth (GELUSIL) 409-811-91 MG chewable tablet Chew by mouth every 6 (six) hours as needed for indigestion or heartburn.   b complex-vitamin c-folic acid (NEPHRO-VITE) 0.8 MG TABS tablet Take 1 tablet by mouth daily.   brimonidine (ALPHAGAN) 0.2 % ophthalmic solution Place 1 drop into both eyes 2 (two) times daily.   calcium acetate (PHOSLO) 667 MG capsule Take 667 mg by mouth 3 (three) times daily with meals.    carbamazepine (TEGRETOL) 100 MG chewable tablet Chew 100 mg by mouth 2 (two) times daily. (0600 & 2100)   Dexlansoprazole 30 MG capsule Take 30 mg by mouth daily.   dicyclomine  (BENTYL) 20 MG tablet Take 20 mg by mouth 3 (three) times daily after meals.   famotidine (PEPCID) 20 MG tablet Take 40 mg by mouth daily as needed for heartburn or indigestion.   fluticasone (FLONASE) 50 MCG/ACT nasal spray Place 1 spray into both nostrils daily.    haloperidol (HALDOL) 5 MG tablet Take 2.5 mg by mouth 3 (three) times a week. (Tuesday, Thursday and Saturday)   HYDROcodone-acetaminophen (NORCO/VICODIN) 5-325 MG tablet Take 1 tablet by mouth every 4 (four) hours as needed for moderate pain.   ipratropium (ATROVENT) 0.03 % nasal spray Place 2 sprays into both nostrils 2 (two) times daily as needed for rhinitis (congestion).   lidocaine-prilocaine (EMLA) cream Apply 1 application topically every Monday, Wednesday, and Friday with hemodialysis.    loratadine (CLARITIN) 10 MG tablet Take 10 mg by mouth daily at 6 (six) AM.   LORazepam (ATIVAN) 0.5 MG tablet Take 0.5 mg by mouth 3 (three) times daily as needed for anxiety.   midodrine (PROAMATINE) 10 MG tablet Take 1 tablet (10 mg total) by mouth every Monday, Wednesday, and Friday with hemodialysis. May take an additional doses as needed for SBP<100.   Netarsudil-Latanoprost (ROCKLATAN) 0.02-0.005 % SOLN Place 1 drop into both eyes at bedtime.    ondansetron (ZOFRAN) 4 MG tablet Take 4 mg by mouth every 6 (six) hours as needed for nausea or vomiting.   rOPINIRole (REQUIP) 2 MG tablet Take 1 tablet (2 mg total) by mouth daily.   simethicone (MYLICON) 478 MG chewable tablet Chew 125 mg by mouth in the morning, at noon, and at bedtime.   timolol (TIMOPTIC) 0.5 % ophthalmic solution Place 1 drop into both eyes 2 (two) times daily.   traZODone (DESYREL) 50 MG tablet Take 25 mg by mouth at bedtime.   vitamin B-12 (CYANOCOBALAMIN) 1000 MCG tablet Take 1,000 mcg by mouth daily at 6 (six) AM.   ziprasidone (GEODON) 20 MG capsule Take 20 mg by mouth at bedtime.        Allergies:   Morphine and related, Gabapentin, Indomethacin, and Pollen  extract    Social History  Socioeconomic History   Marital status: Divorced      Spouse name: Not on file   Number of children: 1   Years of education: Not on file   Highest education level: Not on file  Occupational History   Occupation: Financial risk analyst: [employer]      Comment: retired  Tobacco Use   Smoking status: Never Smoker   Smokeless tobacco: Never Used  Scientific laboratory technician Use: Never used  Substance and Sexual Activity   Alcohol use: No      Alcohol/week: 0.0 standard drinks   Drug use: No   Sexual activity: Not Currently  Other Topics Concern   Not on file  Social History Narrative    [place] alone, divorced, 1 son (healthy), [place] well at baseline.    Social Determinants of Health    Financial Resource Strain: Not on file  Food Insecurity: Not on file  Transportation Needs: Not on file  Physical Activity: Not on file  Stress: Not on file  Social Connections: Not on file      Family History: The patient's family history includes Breast cancer in an other family member; Hypertension in her father; Stroke in her father. There is no history of Colon cancer, Liver disease, or Mental illness.   ROS:   Please see the history of present illness.     All other systems reviewed and are negative.   EKGs/Labs/Other Studies Reviewed:     The following studies were reviewed today:     EKG:  EKG is  ordered today.  The ekg ordered today demonstrates sinus bradycardia, heart rate 57.   Recent Labs: 07/16/2020: ALT 9 10/23/2020: BUN 24; Creatinine, Ser 5.36; Hemoglobin 11.4; Platelets 283; Potassium 4.1; Sodium 143; TSH 2.190  Recent Lipid Panel Labs (Brief)          Component Value Date/Time    CHOL 251 (H) 09/18/2017 0646    CHOL 105 05/14/2014 0412    TRIG 124 09/18/2017 0646    TRIG 127 05/14/2014 0412    HDL 66 09/18/2017 0646    HDL 37 (L) 05/14/2014 0412    CHOLHDL 3.8 09/18/2017 0646    VLDL 25 09/18/2017 0646     VLDL 25 05/14/2014 0412    LDLCALC 160 (H) 09/18/2017 0646    LDLCALC 43 05/14/2014 0412        Physical Exam:     VS:  BP (!) 166/60 (BP Location: Right Arm, Patient Position: Sitting, Cuff Size: Normal)    Pulse (!) 57    Ht 5\' 3"  (1.6 m)    Wt 142 lb (64.4 kg)    SpO2 98%    BMI 25.15 kg/m         Wt Readings from Last 3 Encounters:  11/29/20 142 lb (64.4 kg)  10/23/20 140 lb (63.5 kg)  08/16/20 139 lb (63 kg)      GEN:  Well nourished, well developed in no acute distress HEENT: Normal NECK: No JVD; No carotid bruits LYMPHATICS: No lymphadenopathy CARDIAC: RRR, systolic murmur noted on exam, consistent with AV fistula present RESPIRATORY:  Clear to auscultation without rales, wheezing or rhonchi  ABDOMEN: Soft, non-tender, non-distended MUSCULOSKELETAL:  No edema; left arm AV fistula noted SKIN: Warm and dry NEUROLOGIC:  Alert and oriented x 3 PSYCHIATRIC:  Normal affect    ASSESSMENT:     1. Hemodialysis-associated hypotension   2. Essential hypertension  PLAN:         Hemodialysis induced hypotension.  Cardiac monitor with no significant arrhythmia.  Continue midodrine 10 mg on days of dialysis, Monday Wednesday Friday, about 15 to 30 minutes prior to dialysis session.  Advised patient to discuss with nephrology regarding trying ultrafiltration if that will help with low BP during dialysis. Hypertension, BP elevated, likely from midodrine.  History of orthostasis.  Midodrine's, CKD likely etiology.  Okay for BP to run slightly high due to history of profound hypotension and syncope.  Systolic 619J okay.   Follow-up as needed   This note was generated in part or whole with voice recognition software. Voice recognition is usually quite accurate but there are transcription errors that can and very often do occur. I apologize for any typographical errors that were not detected and corrected.   Medication Adjustments/Labs and Tests Ordered: Current medicines are  reviewed at length with the patient today.  Concerns regarding medicines are outlined above.     Orders Placed This Encounter  Procedures   EKG 12-Lead    No orders of the defined types were placed in this encounter.     Patient Instructions  Medication Instructions:  Your physician recommends that you continue on your current medications as directed. Please refer to the Current Medication list given to you today.   *If you need a refill on your cardiac medications before your next appointment, please call your pharmacy*     Lab Work: None ordered If you have labs (blood work) drawn today and your tests are completely normal, you will receive your results only by: [place] (if you have MyChart) OR A paper copy in the mail If you have any lab test that is abnormal or we need to change your treatment, we will call you to review the results.     Testing/Procedures: None ordered     Follow-Up: At [facility], you and your health needs are our priority.  As part of our continuing mission to provide you with exceptional heart care, we have created designated Provider Care Teams.  These Care Teams include your primary Cardiologist (physician) and Advanced Practice Providers (APPs -  Physician Assistants and Nurse Practitioners) who all work together to provide you with the care you need, when you need it.   We recommend signing up for the patient portal called "MyChart".  Sign up information is provided on this After Visit Summary.  MyChart is used to connect with patients for Virtual Visits (Telemedicine).  Patients are able to view lab/test results, encounter notes, upcoming appointments, etc.  Non-urgent messages can be sent to your provider as well.   To learn more about what you can do with MyChart, go to [email or web address].     Your next appointment:   Follow up as needed    The format for your next appointment:   In Person   Provider:   Kate Sable, MD      Other Instructions         Signed, Joyce Sable, MD  11/29/2020 1:01 PM    [facility]        Electronically signed by Joyce Sable, MD at 11/29/2020  1:01 PM Office Visit on 11/29/2020  Office Visit on 11/29/2020   Detailed Report  Note shared with patient Additional Documentation  Vitals:  BP 166/60 Important   (BP Location: Right Arm, Patient Position: Sitting, Cuff Size: Normal) Pulse 57 Important   Ht 5\' 3"  (1.6 m)  Wt 64.4 kg SpO2 98% BMI 25.15 kg/m BSA 1.69 m  More Vitals  Flowsheets:  Anthropometrics, NEWS, MEWS Score   Encounter Info:  Billing Info, History, Allergies, Detailed Report    Orders Placed   EKG 12-Lead Medication Changes     (Completed Course)  Patient not taking:  Reported on 11/29/2020   Medication List   Visit Diagnoses   Hemodialysis-associated hypotension  Essential hypertension

## 2021-07-31 NOTE — Progress Notes (Addendum)
Per patient ,her surgery was re-scheduled to January 20 th, 2023 due to patient's exposure to CoVID virus  even if she was tested negative as of 07/30/2021. Pt was instructed to come for pre-admit labs on Jan. 17, 2023 provided she does not develop any respiratory symptoms between now and lab date.

## 2021-07-31 NOTE — Patient Instructions (Addendum)
Your procedure is scheduled on: 08/09/2021 Report to the Registration Desk on the 1st floor of the [facility]. To find out your arrival time, please call [phone] between 1PM - 3PM on: 08/08/2021   REMEMBER: Instructions that are not followed completely may result in serious medical risk, up to and including death; or upon the discretion of your surgeon and anesthesiologist your surgery may need to be rescheduled.  Do not eat food after midnight the night before surgery.  No gum chewing, lozengers or hard candies.  You may however, drink CLEAR liquids up to 2 hours before you are scheduled to arrive for your surgery. Do not drink anything within 2 hours of your scheduled arrival time. (Note the arrival time)  Clear liquids include: - water  - apple juice without pulp - gatorade (not RED, PURPLE, OR BLUE) - black coffee or tea (Do NOT add milk or creamers to the coffee or tea) Do NOT drink anything that is not on this list.    TAKE THESE MEDICATIONS THE MORNING OF SURGERY WITH A SIP OF WATER:  Tegretol Dexlansoprazole  Famotidine midodrine   You may use your eye drops and Flonase on day of surgery.   Follow recommendations from Cardiologist, Pulmonologist or PCP regarding stopping Aspirin, Coumadin, Plavix, Eliquis, Pradaxa, or Pletal if patient is on this medication..  One week prior to surgery: Stop Anti-inflammatories (NSAIDS) such as Advil, Aleve, Ibuprofen, Motrin, Naproxen, Naprosyn and Aspirin based products such as Excedrin, Goodys Powder, BC Powder.  Stop ANY OVER THE COUNTER supplements until after surgery.vitamin B-12,  You may however, continue to take Tylenol if needed for pain up until the day of surgery.  No Alcohol for 24 hours before or after surgery.  No Smoking including e-cigarettes for 24 hours prior to surgery.  No chewable tobacco products for at least 6 hours prior to surgery.  No nicotine patches on the day of surgery.  Do not use any  "recreational" drugs for at least a week prior to your surgery.  Please be advised that the combination of cocaine and anesthesia may have negative outcomes, up to and including death. If you test positive for cocaine, your surgery will be cancelled.  On the morning of surgery brush your teeth with toothpaste and water, you may rinse your mouth with mouthwash if you wish. Do not swallow any toothpaste or mouthwash.  Use CHG Soap or wipes as directed on instruction sheet- provided for you.  Do not wear jewelry, make-up, hairpins, clips or nail polish.  Do not wear lotions, powders, or perfumes.   Do not shave body from the neck down 48 hours prior to surgery just in case you cut yourself which could leave a site for infection.  Also, freshly shaved skin may become irritated if using the CHG soap.  Contact lenses, hearing aids and dentures may not be worn into surgery.  Do not bring valuables to the hospital. [facility] is not responsible for any missing/lost belongings or valuables.   Notify your doctor if there is any change in your medical condition (cold, fever, infection).  Wear comfortable clothing (specific to your surgery type) to the hospital.  After surgery, you can help prevent lung complications by doing breathing exercises.  Take deep breaths and cough every 1-2 hours. Your doctor may order a device called an Incentive Spirometer to help you take deep breaths.  If you are being admitted to the hospital overnight, leave your suitcase in the car. After surgery it  may be brought to your room.  If you are being discharged the day of surgery, you will not be allowed to drive home. You will need a responsible adult (18 years or older) to drive you home and stay with you that night.   If you are taking public transportation, you will need to have a responsible adult (18 years or older) with you. Please confirm with your physician that it is acceptable to use public  transportation.   Please call the [facility] at [phone] if you have any questions about these instructions.  Surgery Visitation Policy:  Patients undergoing a surgery or procedure may have one family member or support person with them as long as that person is not COVID-19 positive or experiencing its symptoms.  That person may remain in the waiting area during the procedure and may rotate out with other people.

## 2021-08-06 ENCOUNTER — Encounter
Admission: RE | Admit: 2021-08-06 | Discharge: 2021-08-06 | Disposition: A | Discharge status: Home / Self Care | Payer: Medicare HMO | Source: Ambulatory Visit | Attending: Vascular Surgery | Admitting: Vascular Surgery

## 2021-08-06 ENCOUNTER — Encounter: Payer: Self-pay | Admitting: Urgent Care

## 2021-08-06 ENCOUNTER — Other Ambulatory Visit: Payer: Self-pay

## 2021-08-06 DIAGNOSIS — Z01818 Encounter for other preprocedural examination: Secondary | ICD-10-CM | POA: Insufficient documentation

## 2021-08-06 DIAGNOSIS — Z992 Dependence on renal dialysis: Secondary | ICD-10-CM | POA: Diagnosis not present

## 2021-08-06 DIAGNOSIS — N186 End stage renal disease: Secondary | ICD-10-CM | POA: Diagnosis not present

## 2021-08-06 LAB — CBC WITH DIFFERENTIAL/PLATELET
Abs Immature Granulocytes: 0.03 10*3/uL (ref 0.00–0.07)
Basophils Absolute: 0.1 10*3/uL (ref 0.0–0.1)
Basophils Relative: 1 %
Eosinophils Absolute: 0.2 10*3/uL (ref 0.0–0.5)
Eosinophils Relative: 2 %
HCT: 31.7 % — ABNORMAL LOW (ref 36.0–46.0)
Hemoglobin: 10.4 g/dL — ABNORMAL LOW (ref 12.0–15.0)
Immature Granulocytes: 1 %
Lymphocytes Relative: 23 %
Lymphs Abs: 1.5 10*3/uL (ref 0.7–4.0)
MCH: 32 pg (ref 26.0–34.0)
MCHC: 32.8 g/dL (ref 30.0–36.0)
MCV: 97.5 fL (ref 80.0–100.0)
Monocytes Absolute: 0.5 10*3/uL (ref 0.1–1.0)
Monocytes Relative: 8 %
Neutro Abs: 4.4 10*3/uL (ref 1.7–7.7)
Neutrophils Relative %: 65 %
Platelets: 197 10*3/uL (ref 150–400)
RBC: 3.25 MIL/uL — ABNORMAL LOW (ref 3.87–5.11)
RDW: 13.2 % (ref 11.5–15.5)
WBC: 6.7 10*3/uL (ref 4.0–10.5)
nRBC: 0 % (ref 0.0–0.2)

## 2021-08-06 LAB — BASIC METABOLIC PANEL
Anion gap: 10 (ref 5–15)
BUN: 26 mg/dL — ABNORMAL HIGH (ref 8–23)
CO2: 30 mmol/L (ref 22–32)
Calcium: 9.7 mg/dL (ref 8.9–10.3)
Chloride: 93 mmol/L — ABNORMAL LOW (ref 98–111)
Creatinine, Ser: 6.15 mg/dL — ABNORMAL HIGH (ref 0.44–1.00)
GFR, Estimated: 7 mL/min — ABNORMAL LOW (ref >60)
Glucose, Bld: 102 mg/dL — ABNORMAL HIGH (ref 70–99)
Potassium: 3.8 mmol/L (ref 3.5–5.1)
Sodium: 133 mmol/L — ABNORMAL LOW (ref 135–145)

## 2021-08-06 LAB — TYPE AND SCREEN
ABO/RH(D): O POS
Antibody Screen: NEGATIVE

## 2021-08-08 MED ORDER — ORAL CARE MOUTH RINSE: 15.0000 mL | Freq: Once | OROMUCOSAL | Status: AC

## 2021-08-08 MED ORDER — VANCOMYCIN HCL IN DEXTROSE 1-5 GM/200ML-% IV SOLN: 1000.0000 mg | INTRAVENOUS | Status: AC

## 2021-08-08 MED ORDER — CHLORHEXIDINE GLUCONATE 0.12 % MT SOLN: 15.0000 mL | Freq: Once | OROMUCOSAL | Status: AC

## 2021-08-08 MED ORDER — CHLORHEXIDINE GLUCONATE CLOTH 2 % EX PADS: 6.0000 | MEDICATED_PAD | Freq: Once | CUTANEOUS | Status: DC

## 2021-08-08 MED ORDER — LACTATED RINGERS IV SOLN: INTRAVENOUS | Status: DC

## 2021-08-09 ENCOUNTER — Encounter: Payer: Self-pay | Admitting: Vascular Surgery

## 2021-08-09 ENCOUNTER — Encounter
Admission: RE | Disposition: A | Discharge status: Nursing Home (Includes ICF) | Payer: Self-pay | Source: Home / Self Care | Attending: Vascular Surgery

## 2021-08-09 ENCOUNTER — Ambulatory Visit
Admission: RE | Admit: 2021-08-09 | Discharge: 2021-08-09 | Disposition: A | Discharge status: Nursing Home (Includes ICF) | Payer: Medicare HMO | Attending: Vascular Surgery | Admitting: Vascular Surgery

## 2021-08-09 ENCOUNTER — Ambulatory Visit: Payer: Medicare HMO | Admitting: Certified Registered Nurse Anesthetist

## 2021-08-09 ENCOUNTER — Other Ambulatory Visit: Payer: Self-pay

## 2021-08-09 ENCOUNTER — Ambulatory Visit: Payer: Medicare HMO

## 2021-08-09 DIAGNOSIS — Z992 Dependence on renal dialysis: Secondary | ICD-10-CM | POA: Insufficient documentation

## 2021-08-09 DIAGNOSIS — N186 End stage renal disease: Secondary | ICD-10-CM | POA: Insufficient documentation

## 2021-08-09 DIAGNOSIS — N185 Chronic kidney disease, stage 5: Secondary | ICD-10-CM | POA: Diagnosis not present

## 2021-08-09 DIAGNOSIS — K219 Gastro-esophageal reflux disease without esophagitis: Secondary | ICD-10-CM | POA: Insufficient documentation

## 2021-08-09 DIAGNOSIS — Y841 Kidney dialysis as the cause of abnormal reaction of the patient, or of later complication, without mention of misadventure at the time of the procedure: Secondary | ICD-10-CM | POA: Diagnosis not present

## 2021-08-09 DIAGNOSIS — I120 Hypertensive chronic kidney disease with stage 5 chronic kidney disease or end stage renal disease: Secondary | ICD-10-CM | POA: Diagnosis not present

## 2021-08-09 DIAGNOSIS — T82898A Other specified complication of vascular prosthetic devices, implants and grafts, initial encounter: Secondary | ICD-10-CM | POA: Diagnosis present

## 2021-08-09 HISTORY — PX: REVISON OF ARTERIOVENOUS FISTULA: SHX6074

## 2021-08-09 HISTORY — PX: UPPER EXTREMITY ANGIOGRAM: SHX6310

## 2021-08-09 LAB — POCT I-STAT, CHEM 8
BUN: 26 mg/dL — ABNORMAL HIGH (ref 8–23)
Calcium, Ion: 1.16 mmol/L (ref 1.15–1.40)
Chloride: 101 mmol/L (ref 98–111)
Creatinine, Ser: 7.2 mg/dL — ABNORMAL HIGH (ref 0.44–1.00)
Glucose, Bld: 110 mg/dL — ABNORMAL HIGH (ref 70–99)
HCT: 36 % (ref 36.0–46.0)
Hemoglobin: 12.2 g/dL (ref 12.0–15.0)
Potassium: 3.5 mmol/L (ref 3.5–5.1)
Sodium: 141 mmol/L (ref 135–145)
TCO2: 27 mmol/L (ref 22–32)

## 2021-08-09 SURGERY — REVISON OF ARTERIOVENOUS FISTULA
Anesthesia: General | Laterality: Left

## 2021-08-09 MED ORDER — FENTANYL CITRATE (PF) 100 MCG/2ML IJ SOLN
INTRAMUSCULAR | Status: DC | PRN
  Administered 2021-08-09: 100 ug via INTRAVENOUS
  Administered 2021-08-09: 25 ug via INTRAVENOUS

## 2021-08-09 MED ORDER — CHLORHEXIDINE GLUCONATE 0.12 % MT SOLN
OROMUCOSAL | Status: AC
  Administered 2021-08-09: 15 mL via OROMUCOSAL
  Filled 2021-08-09: qty 15

## 2021-08-09 MED ORDER — ONDANSETRON HCL 4 MG/2ML IJ SOLN: 4.0000 mg | Freq: Four times a day (QID) | INTRAMUSCULAR | Status: DC | PRN

## 2021-08-09 MED ORDER — HEPARIN SODIUM (PORCINE) 5000 UNIT/ML IJ SOLN
INTRAMUSCULAR | Status: AC
  Filled 2021-08-09: qty 1

## 2021-08-09 MED ORDER — LIDOCAINE HCL (CARDIAC) PF 100 MG/5ML IV SOSY
PREFILLED_SYRINGE | INTRAVENOUS | Status: DC | PRN
Start: 2021-08-09 — End: 2021-08-09
  Administered 2021-08-09: 60 mg via INTRAVENOUS

## 2021-08-09 MED ORDER — HEPARIN SODIUM (PORCINE) 1000 UNIT/ML IJ SOLN
INTRAMUSCULAR | Status: DC | PRN
  Administered 2021-08-09: 4000 [IU] via INTRAVENOUS

## 2021-08-09 MED ORDER — HEMOSTATIC AGENTS (NO CHARGE) OPTIME
TOPICAL | Status: DC | PRN
  Administered 2021-08-09: 1 via TOPICAL

## 2021-08-09 MED ORDER — FENTANYL CITRATE (PF) 100 MCG/2ML IJ SOLN: 25.0000 ug | INTRAMUSCULAR | Status: DC | PRN

## 2021-08-09 MED ORDER — TRAMADOL HCL 50 MG PO TABS: 50.0000 mg | ORAL_TABLET | Freq: Four times a day (QID) | ORAL | 0 refills | Status: AC | PRN

## 2021-08-09 MED ORDER — SODIUM CHLORIDE 0.9 % IV SOLN: INTRAVENOUS | Status: DC | PRN

## 2021-08-09 MED ORDER — PHENYLEPHRINE HCL-NACL 20-0.9 MG/250ML-% IV SOLN
INTRAVENOUS | Status: AC
  Filled 2021-08-09: qty 250

## 2021-08-09 MED ORDER — BUPIVACAINE LIPOSOME 1.3 % IJ SUSP
INTRAMUSCULAR | Status: AC
  Filled 2021-08-09: qty 20

## 2021-08-09 MED ORDER — HEPARIN 5000 UNITS IN NS 1000 ML (FLUSH)
INTRAMUSCULAR | Status: DC | PRN
  Administered 2021-08-09: 100 mL via INTRAMUSCULAR

## 2021-08-09 MED ORDER — PHENYLEPHRINE HCL (PRESSORS) 10 MG/ML IV SOLN
INTRAVENOUS | Status: AC
  Filled 2021-08-09: qty 1

## 2021-08-09 MED ORDER — BUPIVACAINE HCL (PF) 0.5 % IJ SOLN
INTRAMUSCULAR | Status: AC
  Filled 2021-08-09: qty 30

## 2021-08-09 MED ORDER — FENTANYL CITRATE (PF) 100 MCG/2ML IJ SOLN
INTRAMUSCULAR | Status: AC
  Filled 2021-08-09: qty 2

## 2021-08-09 MED ORDER — ONDANSETRON HCL 4 MG/2ML IJ SOLN
INTRAMUSCULAR | Status: DC | PRN
  Administered 2021-08-09: 4 mg via INTRAVENOUS

## 2021-08-09 MED ORDER — PHENYLEPHRINE HCL-NACL 20-0.9 MG/250ML-% IV SOLN
INTRAVENOUS | Status: DC | PRN
Start: 2021-08-09 — End: 2021-08-09
  Administered 2021-08-09: 20 ug/min via INTRAVENOUS

## 2021-08-09 MED ORDER — ACETAMINOPHEN 10 MG/ML IV SOLN
INTRAVENOUS | Status: AC
  Filled 2021-08-09: qty 100

## 2021-08-09 MED ORDER — VANCOMYCIN HCL IN DEXTROSE 1-5 GM/200ML-% IV SOLN
INTRAVENOUS | Status: AC
  Administered 2021-08-09: 1000 mg via INTRAVENOUS
  Filled 2021-08-09: qty 200

## 2021-08-09 MED ORDER — PHENYLEPHRINE HCL (PRESSORS) 10 MG/ML IV SOLN: INTRAVENOUS | Status: DC | PRN

## 2021-08-09 MED ORDER — ROCURONIUM BROMIDE 100 MG/10ML IV SOLN
INTRAVENOUS | Status: DC | PRN
  Administered 2021-08-09: 20 mg via INTRAVENOUS
  Administered 2021-08-09 (×3): 10 mg via INTRAVENOUS

## 2021-08-09 MED ORDER — SUCCINYLCHOLINE CHLORIDE 200 MG/10ML IV SOSY
PREFILLED_SYRINGE | INTRAVENOUS | Status: DC | PRN
Start: 2021-08-09 — End: 2021-08-09
  Administered 2021-08-09: 100 mg via INTRAVENOUS

## 2021-08-09 MED ORDER — GLYCOPYRROLATE 0.2 MG/ML IJ SOLN
INTRAMUSCULAR | Status: DC | PRN
  Administered 2021-08-09: .2 mg via INTRAVENOUS

## 2021-08-09 MED ORDER — ONDANSETRON HCL 4 MG/2ML IJ SOLN: 4.0000 mg | Freq: Once | INTRAMUSCULAR | Status: DC | PRN

## 2021-08-09 MED ORDER — PROPOFOL 10 MG/ML IV BOLUS
INTRAVENOUS | Status: DC | PRN
  Administered 2021-08-09: 650 mg via INTRAVENOUS
  Administered 2021-08-09: 100 mg via INTRAVENOUS

## 2021-08-09 MED ORDER — FENTANYL CITRATE PF 50 MCG/ML IJ SOSY: 12.5000 ug | PREFILLED_SYRINGE | Freq: Once | INTRAMUSCULAR | Status: DC | PRN

## 2021-08-09 MED ORDER — DEXAMETHASONE SODIUM PHOSPHATE 10 MG/ML IJ SOLN
INTRAMUSCULAR | Status: DC | PRN
  Administered 2021-08-09: 5 mg via INTRAVENOUS

## 2021-08-09 MED ORDER — PROPOFOL 10 MG/ML IV BOLUS
INTRAVENOUS | Status: AC
  Filled 2021-08-09: qty 20

## 2021-08-09 MED ORDER — PHENYLEPHRINE 40 MCG/ML (10ML) SYRINGE FOR IV PUSH (FOR BLOOD PRESSURE SUPPORT)
PREFILLED_SYRINGE | INTRAVENOUS | Status: DC | PRN
  Administered 2021-08-09: 80 ug via INTRAVENOUS
  Administered 2021-08-09: 20 ug via INTRAVENOUS
  Administered 2021-08-09: 40 ug via INTRAVENOUS

## 2021-08-09 MED ORDER — SUGAMMADEX SODIUM 200 MG/2ML IV SOLN
INTRAVENOUS | Status: DC | PRN
Start: 2021-08-09 — End: 2021-08-09
  Administered 2021-08-09: 134 mg via INTRAVENOUS

## 2021-08-09 MED ORDER — ACETAMINOPHEN 10 MG/ML IV SOLN
INTRAVENOUS | Status: DC | PRN
  Administered 2021-08-09: 1000 mg via INTRAVENOUS

## 2021-08-09 SURGICAL SUPPLY — 65 items
ADH SKN CLS APL DERMABOND .7 (GAUZE/BANDAGES/DRESSINGS) ×1
APL PRP STRL LF DISP 70% ISPRP (MISCELLANEOUS) ×1
APPLIER CLIP 11 MED OPEN (CLIP)
APPLIER CLIP 9.375 SM OPEN (CLIP)
APR CLP MED 11 20 MLT OPN (CLIP)
APR CLP SM 9.3 20 MLT OPN (CLIP)
BAG DECANTER FOR FLEXI CONT (MISCELLANEOUS) ×2 IMPLANT
BLADE SURG 15 STRL LF DISP TIS (BLADE) ×1 IMPLANT
BLADE SURG 15 STRL SS (BLADE) ×2
BLADE SURG SZ11 CARB STEEL (BLADE) ×2 IMPLANT
BOOT SUTURE AID YELLOW STND (SUTURE) ×2 IMPLANT
BRUSH SCRUB EZ  4% CHG (MISCELLANEOUS) ×1
BRUSH SCRUB EZ 4% CHG (MISCELLANEOUS) ×1 IMPLANT
CHLORAPREP W/TINT 26 (MISCELLANEOUS) ×2 IMPLANT
CLIP APPLIE 11 MED OPEN (CLIP) IMPLANT
CLIP APPLIE 9.375 SM OPEN (CLIP) IMPLANT
DECANTER SPIKE VIAL GLASS SM (MISCELLANEOUS) ×2 IMPLANT
DERMABOND ADVANCED (GAUZE/BANDAGES/DRESSINGS) ×1
DERMABOND ADVANCED .7 DNX12 (GAUZE/BANDAGES/DRESSINGS) ×1 IMPLANT
DRESSING SURGICEL FIBRLLR 1X2 (HEMOSTASIS) ×1 IMPLANT
DRSG SURGICEL FIBRILLAR 1X2 (HEMOSTASIS) ×2
ELECT CAUTERY BLADE 6.4 (BLADE) ×2 IMPLANT
ELECT REM PT RETURN 9FT ADLT (ELECTROSURGICAL) ×2
ELECTRODE REM PT RTRN 9FT ADLT (ELECTROSURGICAL) ×1 IMPLANT
GLOVE SURG SYN 8.0 (GLOVE) ×2 IMPLANT
GLOVE SURG SYN 8.0 PF PI (GLOVE) ×1 IMPLANT
GOWN STRL REUS W/ TWL LRG LVL3 (GOWN DISPOSABLE) ×1 IMPLANT
GOWN STRL REUS W/ TWL XL LVL3 (GOWN DISPOSABLE) ×1 IMPLANT
GOWN STRL REUS W/TWL LRG LVL3 (GOWN DISPOSABLE) ×2
GOWN STRL REUS W/TWL XL LVL3 (GOWN DISPOSABLE) ×2
IV NS 500ML (IV SOLUTION) ×2
IV NS 500ML BAXH (IV SOLUTION) ×1 IMPLANT
KIT TURNOVER KIT A (KITS) ×2 IMPLANT
LABEL OR SOLS (LABEL) ×2 IMPLANT
LOOP RED MAXI  1X406MM (MISCELLANEOUS) ×1
LOOP VESSEL MAXI 1X406 RED (MISCELLANEOUS) ×1 IMPLANT
LOOP VESSEL MINI 0.8X406 BLUE (MISCELLANEOUS) ×2 IMPLANT
LOOPS BLUE MINI 0.8X406MM (MISCELLANEOUS) ×2
MANIFOLD NEPTUNE II (INSTRUMENTS) ×2 IMPLANT
NDL FILTER BLUNT 18X1 1/2 (NEEDLE) ×1 IMPLANT
NEEDLE FILTER BLUNT 18X 1/2SAF (NEEDLE) ×1
NEEDLE FILTER BLUNT 18X1 1/2 (NEEDLE) ×1 IMPLANT
NS IRRIG 500ML POUR BTL (IV SOLUTION) ×2 IMPLANT
PACK EXTREMITY ARMC (MISCELLANEOUS) ×2 IMPLANT
PAD PREP 24X41 OB/GYN DISP (PERSONAL CARE ITEMS) ×2 IMPLANT
PATCH CAROTID ECM VASC 1X10 (Prosthesis & Implant Heart) ×1 IMPLANT
STOCKINETTE 48X4 2 PLY STRL (GAUZE/BANDAGES/DRESSINGS) ×1 IMPLANT
STOCKINETTE STRL 4IN 9604848 (GAUZE/BANDAGES/DRESSINGS) ×2 IMPLANT
SUT MNCRL 4-0 (SUTURE) ×2
SUT MNCRL 4-0 27XMFL (SUTURE) ×1
SUT MNCRL+ 5-0 UNDYED PC-3 (SUTURE) ×1 IMPLANT
SUT MONOCRYL 5-0 (SUTURE) ×1
SUT PROLENE 6 0 BV (SUTURE) ×14 IMPLANT
SUT SILK 2 0 (SUTURE) ×2
SUT SILK 2-0 18XBRD TIE 12 (SUTURE) ×1 IMPLANT
SUT SILK 3 0 (SUTURE) ×2
SUT SILK 3-0 18XBRD TIE 12 (SUTURE) ×1 IMPLANT
SUT SILK 4 0 (SUTURE) ×2
SUT SILK 4-0 18XBRD TIE 12 (SUTURE) ×1 IMPLANT
SUT VIC AB 3-0 SH 27 (SUTURE) ×4
SUT VIC AB 3-0 SH 27X BRD (SUTURE) ×2 IMPLANT
SUTURE MNCRL 4-0 27XMF (SUTURE) IMPLANT
SYR 20ML LL LF (SYRINGE) ×2 IMPLANT
SYR 3ML LL SCALE MARK (SYRINGE) ×2 IMPLANT
WATER STERILE IRR 500ML POUR (IV SOLUTION) ×2 IMPLANT

## 2021-08-09 NOTE — Interval H&P Note (Signed)
History and Physical Interval Note:  08/09/2021 7:20 AM  Joyce [name]  has presented today for surgery, with the diagnosis of ESRD.  The various methods of treatment have been discussed with the patient and family. After consideration of risks, benefits and other options for treatment, the patient has consented to  Procedure(s): REVISON OF ARTERIOVENOUS FISTULA ( BRACHIAL CEPHALIC) (Left) UPPER EXTREMITY ANGIOGRAM ( POSSIBLE INTERVENTION) (Left) as a surgical intervention.  The patient's history has been reviewed, patient examined, no change in status, stable for surgery.  I have reviewed the patient's chart and labs.  Questions were answered to the patient's satisfaction.     [name]

## 2021-08-09 NOTE — Anesthesia Procedure Notes (Signed)
Procedure Name: Intubation Date/Time: 08/09/2021 7:47 AM Performed by: Willette Alma, CRNA Pre-anesthesia Checklist: Patient identified, Patient being monitored, Timeout performed, Emergency Drugs available and Suction available Patient Re-evaluated:Patient Re-evaluated prior to induction Oxygen Delivery Method: Circle system utilized Preoxygenation: Pre-oxygenation with 100% oxygen Induction Type: IV induction Ventilation: Mask ventilation without difficulty Laryngoscope Size: 3 and McGraph Grade View: Grade I Tube type: Oral Tube size: 7.0 mm Number of attempts: 1 Airway Equipment and Method: Stylet Placement Confirmation: ETT inserted through vocal cords under direct vision, positive ETCO2 and breath sounds checked- equal and bilateral Secured at: 21 cm Tube secured with: Tape Dental Injury: Teeth and Oropharynx as per pre-operative assessment  Comments: Atraumatic laryngoscopy and intubation. Lips, gums, teeth remain same as preoperative evaluation.

## 2021-08-09 NOTE — OR Nursing (Signed)
Per [name] [phone] - driver for [facility] will be here at 12 noon to pick up pt

## 2021-08-09 NOTE — Discharge Instructions (Addendum)
AMBULATORY SURGERY  DISCHARGE INSTRUCTIONS   The drugs that you were given will stay in your system until tomorrow so for the next 24 hours you should not:  Drive an automobile Make any legal decisions Drink any alcoholic beverage   You may resume regular meals tomorrow.  Today it is better to start with liquids and gradually work up to solid foods.  You may eat anything you prefer, but it is better to start with liquids, then soup and crackers, and gradually work up to solid foods.   Please notify your doctor immediately if you have any unusual bleeding, trouble breathing, redness and pain at the surgery site, drainage, fever, or pain not relieved by medication.    Additional Instructions:   PER [place], PRESCRIPTION NEEDS TO BE SENT TO [facility] [place] [phone] (NOTE HAS BEEN SENT TO [facility] REGARDING THIS REQUEST)        Please contact your physician with any problems or Same Day Surgery at [phone], Monday through Friday 6 am to 4 pm, or [place] at [facility] number at [phone].

## 2021-08-09 NOTE — Anesthesia Preprocedure Evaluation (Signed)
Anesthesia Evaluation  Patient identified by MRN, date of birth, ID band Patient awake    Reviewed: Allergy & Precautions, NPO status , Patient's Chart, lab work & pertinent test results  History of Anesthesia Complications (+) Family history of anesthesia reactionNegative for: history of anesthetic complications  Airway Mallampati: II  TM Distance: >3 FB Neck ROM: Full    Dental  (+) Dental Advidsory Given, Poor Dentition   Pulmonary neg pulmonary ROS, neg shortness of breath, neg sleep apnea, neg COPD, neg recent URI,    breath sounds clear to auscultation- rhonchi (-) wheezing      Cardiovascular hypertension, Pt. on medications (-) angina(-) CAD, (-) Past MI, (-) Cardiac Stents and (-) CABG + Valvular Problems/Murmurs  Rhythm:Regular Rate:Normal - Systolic murmurs and - Diastolic murmurs    Neuro/Psych  Headaches, PSYCHIATRIC DISORDERS Bipolar Disorder    GI/Hepatic Neg liver ROS, hiatal hernia, GERD  ,  Endo/Other  negative endocrine ROSneg diabetes  Renal/GU ESRF and DialysisRenal disease (last dialysis 10/20/17 )     Musculoskeletal negative musculoskeletal ROS (+)   Abdominal (+) - obese,   Peds  Hematology  (+) Blood dyscrasia, anemia ,   Anesthesia Other Findings Anal fissure Bipolar affective disorder (HCC) CKD (chronic kidney disease)     Comment:  Dr Holley Raring [name] 4 Colon polyps  Diverticulitis Diverticulitis Family history of adverse reaction to anesthesia     Comment:  mom - PONV No date: GERD (gastroesophageal reflux disease) No date: Headache     Comment:  migraines - none over 10 yrs No date: Heart murmur No date: History of hiatal hernia No date: Hypertension No date: Pancreatitis     Comment:  Valproic acid No date: Vertigo   Reproductive/Obstetrics                             Anesthesia Physical  Anesthesia Plan  ASA: 4  Anesthesia Plan: General    Post-op Pain Management:    Induction: Intravenous  PONV Risk Score and Plan: 3 and Ondansetron, Dexamethasone and Treatment may vary due to age or medical condition  Airway Management Planned: LMA  Additional Equipment:   Intra-op Plan:   Post-operative Plan: Extubation in OR  Informed Consent: I have reviewed the patients History and Physical, chart, labs and discussed the procedure including the risks, benefits and alternatives for the proposed anesthesia with the patient or authorized representative who has indicated his/her understanding and acceptance.     Dental advisory given  Plan Discussed with: CRNA, Anesthesiologist and Surgeon  Anesthesia Plan Comments:         Anesthesia Quick Evaluation

## 2021-08-09 NOTE — Transfer of Care (Signed)
Immediate Anesthesia Transfer of Care Note  Patient: Joyce [name]  Procedure(s) Performed: REVISON OF ARTERIOVENOUS FISTULA ( BRACHIAL CEPHALIC) (Left) UPPER EXTREMITY ANGIOGRAM ( POSSIBLE INTERVENTION) (Left)  Patient Location: PACU  Anesthesia Type:General  Level of Consciousness: awake, alert  and oriented  Airway & Oxygen Therapy: Patient Spontanous Breathing and Patient connected to face mask oxygen  Post-op Assessment: Report given to RN and Post -op Vital signs reviewed and stable  Post vital signs: Reviewed and stable  Last Vitals:  Vitals Value Taken Time  BP 148/57 08/09/21 1020  Temp    Pulse 81 08/09/21 1021  Resp 17 08/09/21 1021  SpO2 100 % 08/09/21 1021  Vitals shown include unvalidated device data.  Last Pain:  Vitals:   08/09/21 0613  TempSrc: Temporal  PainSc: 2          Complications: No notable events documented.

## 2021-08-09 NOTE — Op Note (Signed)
OPERATIVE NOTE   PROCEDURE:  Revision left brachial cephalic arteriovenous fistula. Reconstruction brachial artery with resection of redundancy  end-to-end anastomosis and CorMatrix patch angioplasty. Contrast injection left arm brachiocephalic fistula  PRE-OPERATIVE DIAGNOSIS: End Stage Renal Disease  POST-OPERATIVE DIAGNOSIS: End Stage Renal Disease  SURGEON: Joyce [name]  ASSISTANT(S): None  ANESTHESIA: general  ESTIMATED BLOOD LOSS: <50 cc  FINDING(S): Both the brachial artery as well as the proximal cephalic vein were kinked the artery was also noted to have significant stenosis distal to the anastomosis with marked redundancy.  On table fistulogram demonstrated that the fistula itself as well as the stented portion in the cephalic subclavian confluence were widely patent status post reconstruction  SPECIMEN(S):  none  INDICATIONS:   Joyce [name] is a 76 y.o. female who presents with end stage renal disease.  The patient is scheduled for left brachiocephalic arteriovenous fistula placement.  The patient is aware the risks include but are not limited to: bleeding, infection, steal syndrome, nerve damage, ischemic monomelic neuropathy, failure to mature, and need for additional procedures.  The patient is aware of the risks of the procedure and elects to proceed forward.  DESCRIPTION: After full informed written consent was obtained from the patient, the patient was brought back to the operating room and placed supine upon the operating table.  Prior to induction, the patient received IV antibiotics.   After obtaining adequate anesthesia, the patient was then prepped and draped in the standard fashion for reconstruction of the left arm access.     Attention was then turned to the brachial artery which was exposed through a linear incision following the path of the brachial artery itself.  Brachial artery in existing fistula were exposed and the meticulous dissection  was performed circumferentially exposing approximately 3 cm of brachial artery proximally as well as 3 cm brachial artery distally and 3 to 4 cm of the proximal portion of the fistula.  At this point it appeared the fistula itself demonstrated a kink which was flow-limiting there also appeared to be marked redundancy of the brachial artery which was also creating a hemodynamically significant stenosis of the brachial artery just distal to the anastomosis.  Given these findings the only feasible way to create a fistula was to transect the existing fistula at the level of the anastomosis.  Then resect approximately 10 to 12 mm of the brachial artery so that it could be straightened out perform an end-to-end anastomosis of the brachial artery with a CorMatrix patch angioplasty to complete the arterial reconstruction.  A completely separate and distinct site distal to the patch was then selected for reimplantation of the cephalic vein to reconstruct the actual fistula itself.    The brachial artery looped proximally and distally. Side branches were controlled with 4-0 silk ties.  Fogarty vascular clamp was used to control the fistula.  11 blade was then used to transect the fistula at the Prolene suture line and excess vein was debrided from the brachial artery with Potts scissors.  Fistula was then irrigated with 75 cc of heparinized saline and reclamped.  Brachial artery was then transected with Potts scissors and and anastomosis was fashioned with a running 6-0 Prolene which was controlled at the upper edges both medial and laterally with an interrupted 6-0 Prolene suture.  CorMatrix patch was then rehydrated trimmed and applied to repair the remaining arterial defect using running 6-0 Prolene beginning from both the proximal and distal ends and meeting in the middle.  Flushing maneuvers were performed and flow was reestablished to the brachial artery.  2+ radial pulse was immediately identified.  The cephalic  vein was then trimmed at the level of the anastomosis there was a segment of approximately 15 mm of vein that measures only 5 mm in diameter before the fistula expands in size.  This should be adequate for creation of a new fistula without any further banding.  A tension-free spot was selected on the brachial artery distal to the patch.  An arteriotomy was then made with a #11 blade, and extended with a Potts scissor.  Heparinized saline was injected proximal and distal into the radial artery.  The vein was then approximated to the artery while the artery was in its native bed and subsequently the vein was beveled using Potts scissors. The vein was then sewn to the artery in an end-to-side configuration with a running stitch of 6-0 Prolene.  Prior to completing this anastomosis Flushing maneuvers were performed and the artery was allowed to forward and back bleed.  There was no evidence of clot from any vessels.  I completed the anastomosis in the usual fashion and then released all vessel loops and clamps.    There was good  thrill in the venous outflow, and there was 2+ palpable radial pulse.  At this point, I irrigated out the surgical wound.  There was no further active bleeding.  The subcutaneous tissue was reapproximated with a running stitch of 3-0 Vicryl.  The skin was then reapproximated with a running subcuticular stitch of 4-0 Vicryl.    A Seldinger needle was then used to access the fistula under direct visualization and a J-wire advanced followed by a 6 [place] sheath.  Hand-injection of contrast through the 6 French sheath was then performed demonstrating the fistula from the level of the reconstruction to the central veins.  No hemodynamic obstructions or narrowings were identified.  Cephalic subclavian confluence is widely patent as is the previously placed stents.  Sheath was then removed after a pursestring suture of 4-0 Monocryl had been placed.  The skin was then cleaned, dried, and  reinforced with Dermabond.    The patient tolerated this procedure well.   COMPLICATIONS: None  CONDITION: [name]  Office: [phone]   08/09/2021, 10:14 AM

## 2021-08-11 NOTE — Anesthesia Postprocedure Evaluation (Signed)
Anesthesia Post Note  Patient: Joyce [name]  Procedure(s) Performed: REVISON OF ARTERIOVENOUS FISTULA ( BRACHIAL CEPHALIC) (Left) UPPER EXTREMITY ANGIOGRAM ( POSSIBLE INTERVENTION) (Left)  Patient location during evaluation: PACU Anesthesia Type: General Level of consciousness: awake and alert Pain management: pain level controlled Vital Signs Assessment: post-procedure vital signs reviewed and stable Respiratory status: spontaneous breathing, nonlabored ventilation, respiratory function stable and patient connected to nasal cannula oxygen Cardiovascular status: blood pressure returned to baseline and stable Postop Assessment: no apparent nausea or vomiting Anesthetic complications: no   No notable events documented.   Last Vitals:  Vitals:   08/09/21 1107 08/09/21 1135  BP: (!) 141/57 (!) 137/52  Pulse: 65 62  Resp: 16 18  Temp: (!) 36.3 C 36.6 C  SpO2: 97% 95%    Last Pain:  Vitals:   08/09/21 1135  TempSrc: Temporal  PainSc: 0-No pain                 Martha Clan

## 2021-08-12 ENCOUNTER — Encounter: Payer: Self-pay | Admitting: Vascular Surgery

## 2021-08-26 ENCOUNTER — Ambulatory Visit (INDEPENDENT_AMBULATORY_CARE_PROVIDER_SITE_OTHER): Payer: Medicare HMO | Admitting: Vascular Surgery

## 2021-08-29 ENCOUNTER — Other Ambulatory Visit: Payer: Self-pay

## 2021-08-29 ENCOUNTER — Encounter (INDEPENDENT_AMBULATORY_CARE_PROVIDER_SITE_OTHER): Payer: Self-pay | Admitting: Vascular Surgery

## 2021-08-29 ENCOUNTER — Ambulatory Visit (INDEPENDENT_AMBULATORY_CARE_PROVIDER_SITE_OTHER): Payer: Medicare HMO | Admitting: Vascular Surgery

## 2021-08-29 VITALS — BP 179/77 | HR 64 | Ht 63.0 in | Wt 148.0 lb

## 2021-08-29 DIAGNOSIS — Z992 Dependence on renal dialysis: Secondary | ICD-10-CM

## 2021-08-29 DIAGNOSIS — N186 End stage renal disease: Secondary | ICD-10-CM

## 2021-08-29 DIAGNOSIS — T829XXS Unspecified complication of cardiac and vascular prosthetic device, implant and graft, sequela: Secondary | ICD-10-CM

## 2021-08-29 NOTE — Progress Notes (Signed)
Patient ID: Joyce [name], female   DOB: 1946/04/02, 76 y.o.   MRN: [id number]  Chief Complaint  Patient presents with   Follow-up    2 wk FU    HPI Joyce [name] is a 76 y.o. female.     Patient states she is still having some discomfort along the lateral aspect of her forearm but that the hand pain is gone her thumb no longer bothers her when she is on the machine.  She has not been having prolonged bleeding after they remove the needles.  Dialysis has been going well.  Past Medical History:  Diagnosis Date   Anal fissure    Bipolar affective disorder ([place])    CKD (chronic kidney disease)    Dr Holley Raring [name] 4   Colon polyps    Diverticulitis    Diverticulitis    Family history of adverse reaction to anesthesia    mom - PONV   GERD (gastroesophageal reflux disease)    Headache    migraines - none over 10 yrs   Heart murmur    History of hiatal hernia    Hypertension    Pancreatitis    Valproic acid   Vertigo     Past Surgical History:  Procedure Laterality Date   A/V FISTULAGRAM Left 04/14/2017   Procedure: A/V Fistulagram;  Surgeon: Katha Cabal, MD;  Location: [place] [facility];  Service: Cardiovascular;  Laterality: Left;   A/V FISTULAGRAM Left 06/09/2017   Procedure: A/V FISTULAGRAM;  Surgeon: Katha Cabal, MD;  Location: [place] [facility];  Service: Cardiovascular;  Laterality: Left;   A/V FISTULAGRAM Left 11/02/2017   Procedure: A/V FISTULAGRAM;  Surgeon: Algernon Huxley, MD;  Location: [place] [facility];  Service: Cardiovascular;  Laterality: Left;   A/V FISTULAGRAM Left 12/22/2018   Procedure: A/V FISTULAGRAM;  Surgeon: Katha Cabal, MD;  Location: [place] [facility];  Service: Cardiovascular;  Laterality: Left;   A/V FISTULAGRAM Left 06/12/2020   Procedure: A/V FISTULAGRAM;  Surgeon: Katha Cabal, MD;  Location: [place] [facility];  Service: Cardiovascular;  Laterality: Left;   A/V FISTULAGRAM Left 01/22/2021    Procedure: A/V FISTULAGRAM;  Surgeon: Katha Cabal, MD;  Location: [place] [facility];  Service: Cardiovascular;  Laterality: Left;   A/V SHUNT INTERVENTION N/A 11/02/2017   Procedure: A/V SHUNT INTERVENTION;  Surgeon: Algernon Huxley, MD;  Location: [place] [facility];  Service: Cardiovascular;  Laterality: N/A;   ABDOMINAL HYSTERECTOMY  1990 ?   AV FISTULA PLACEMENT  4/30   BREAST EXCISIONAL BIOPSY Left 1994   neg surgical bx   CHOLECYSTECTOMY  2003   COLONOSCOPY  2014   Dr. Jamal Collin   COLONOSCOPY WITH PROPOFOL N/A 09/24/2015   Procedure: COLONOSCOPY WITH random colon byopies.;  Surgeon: Lucilla Lame, MD;  Location: [place];  Service: Endoscopy;  Laterality: N/A;   ESOPHAGOGASTRODUODENOSCOPY (EGD) WITH PROPOFOL N/A 09/24/2015   Procedure: ESOPHAGOGASTRODUODENOSCOPY (EGD) ;  Surgeon: Lucilla Lame, MD;  Location: [place];  Service: Endoscopy;  Laterality: N/A;   ESOPHAGOGASTRODUODENOSCOPY (EGD) WITH PROPOFOL N/A 12/11/2020   Procedure: ESOPHAGOGASTRODUODENOSCOPY (EGD) WITH PROPOFOL;  Surgeon: Lucilla Lame, MD;  Location: [facility];  Service: Endoscopy;  Laterality: N/A;   EYE SURGERY     PERIPHERAL VASCULAR CATHETERIZATION N/A 05/29/2015   Procedure: A/V Shuntogram/Fistulagram;  Surgeon: Katha Cabal, MD;  Location: [place] [facility];  Service: Cardiovascular;  Laterality: N/A;   PERIPHERAL VASCULAR CATHETERIZATION N/A 05/29/2015  Procedure: A/V Shunt Intervention;  Surgeon: Katha Cabal, MD;  Location: [place] [facility];  Service: Cardiovascular;  Laterality: N/A;   POLYPECTOMY  09/24/2015   Procedure: POLYPECTOMY INTESTINAL;  Surgeon: Lucilla Lame, MD;  Location: [place];  Service: Endoscopy;;  cecal polyp ascending polyp   REVISON OF ARTERIOVENOUS FISTULA Left 08/09/2021   Procedure: REVISON OF ARTERIOVENOUS FISTULA ( BRACHIAL CEPHALIC);  Surgeon: Katha Cabal, MD;  Location: [facility];  Service: Vascular;  Laterality: Left;   UPPER  EXTREMITY ANGIOGRAM Left 08/09/2021   Procedure: UPPER EXTREMITY ANGIOGRAM ( POSSIBLE INTERVENTION);  Surgeon: Katha Cabal, MD;  Location: [facility];  Service: Vascular;  Laterality: Left;   UPPER EXTREMITY ANGIOGRAPHY Left 07/02/2021   Procedure: UPPER EXTREMITY ANGIOGRAPHY;  Surgeon: Katha Cabal, MD;  Location: [place] [facility];  Service: Cardiovascular;  Laterality: Left;      Allergies  Allergen Reactions   Morphine And Related Shortness Of Breath    Pt reports chest pain and difficulty breathing.   Gabapentin     PER MAR   Indomethacin Hives   Penicillins Other (See Comments)    PER MAR   Pollen Extract Other (See Comments)    Sinus problems and HA    Current Outpatient Medications  Medication Sig Dispense Refill   acetaminophen (TYLENOL) 500 MG tablet Take 1,000 mg by mouth daily as needed for moderate pain.     brimonidine (ALPHAGAN) 0.2 % ophthalmic solution Place 1 drop into both eyes 2 (two) times daily. (Patient taking differently: Place 1 drop into both eyes 2 (two) times daily. (0600 & 2000)) 5 mL 1   calcium acetate (PHOSLO) 667 MG capsule Take 667 mg by mouth 3 (three) times daily with meals.     carbamazepine (TEGRETOL) 100 MG chewable tablet Chew 100 mg by mouth 2 (two) times daily. (0600 & 2100)     Carboxymethylcellulose Sod PF 1 % GEL Place 3 drops into the right eye 4 (four) times daily.     Dexlansoprazole 30 MG capsule Take 30 mg by mouth daily. (0630)     diclofenac Sodium (VOLTAREN) 1 % GEL Apply 2 g topically 3 (three) times daily as needed (pain).     dicyclomine (BENTYL) 20 MG tablet Take 20 mg by mouth 3 (three) times daily after meals.     famotidine (PEPCID) 40 MG tablet Take 40 mg by mouth at bedtime. (2000)     fluticasone (FLONASE) 50 MCG/ACT nasal spray Place 1 spray into both nostrils in the morning. (0700)     haloperidol (HALDOL) 5 MG tablet TAKE 1/2 TABLET THREE TIMES WEEKLY ON MONDAY, WEDNESDAY AND FRIDAY  MORNING      lidocaine-prilocaine (EMLA) cream APPLY TO ACCESS AREA 30 MINUTES BEFORE DIALYSIS MONDAY / WEDNESDAY / FRIDAY     loratadine (CLARITIN) 10 MG tablet Take 10 mg by mouth daily.     montelukast (SINGULAIR) 10 MG tablet Take 10 mg by mouth daily.     Netarsudil-Latanoprost (ROCKLATAN) 0.02-0.005 % SOLN INSTILL 1 DROP INTO EACH EYE AT BEDTIME     ondansetron (ZOFRAN) 4 MG tablet Take 4 mg by mouth every 6 (six) hours as needed for nausea or vomiting.     rOPINIRole (REQUIP) 2 MG tablet Take 2 mg by mouth at bedtime.     timolol (TIMOPTIC) 0.5 % ophthalmic solution Place 1 drop into both eyes 2 (two) times daily.     traMADol (ULTRAM) 50 MG tablet Take 1 tablet (50 mg  total) by mouth every 6 (six) hours as needed. 40 tablet 0   traZODone (DESYREL) 50 MG tablet Take 25 mg by mouth at bedtime.     vitamin B-12 (CYANOCOBALAMIN) 1000 MCG tablet Take 1,000 mcg by mouth in the morning. (0600)     ziprasidone (GEODON) 20 MG capsule Take 20 mg by mouth at bedtime. (2100)     No current facility-administered medications for this visit.        Physical Exam BP (!) 179/77    Pulse 64    Ht 5\' 3"  (1.6 m)    Wt 148 lb (67.1 kg)    BMI 26.22 kg/m  Gen:  WD/WN, NAD Skin: incision C/D/I; fistula with good thrill good bruit 2+ radial pulse left hand     Assessment/Plan: 1. ESRD on dialysis [facility] Recommend:  The patient is doing well and currently has adequate dialysis access. The revision has been successful and I believe that the pain of the forearm will resolve The patient's dialysis center is not reporting any access issues. Flow pattern is stable when compared to the prior ultrasound.  The patient should have a duplex ultrasound of the dialysis access in 3 months. The patient will follow-up with me in the office after each ultrasound    - VAS [place] (AVF, AVG); Future  2. Complication of vascular access for dialysis, sequela Recommend:  The patient is doing well and  currently has adequate dialysis access. The revision has been successful and I believe that the pain of the forearm will resolve The patient's dialysis center is not reporting any access issues. Flow pattern is stable when compared to the prior ultrasound.  The patient should have a duplex ultrasound of the dialysis access in 3 months. The patient will follow-up with me in the office after each ultrasound    - VAS [place] (AVF, AVG); Future       [name] 08/29/2021, 10:30 AM   This note was created with Dragon medical transcription system.  Any errors from dictation are unintentional.

## 2021-08-30 ENCOUNTER — Inpatient Hospital Stay
Admission: EM | Admit: 2021-08-30 | Discharge: 2021-09-02 | DRG: 304 | Disposition: A | Discharge status: Nursing Home (Includes ICF) | Payer: Medicare HMO | Attending: Internal Medicine | Admitting: Internal Medicine

## 2021-08-30 ENCOUNTER — Other Ambulatory Visit: Payer: Self-pay

## 2021-08-30 ENCOUNTER — Inpatient Hospital Stay: Payer: Medicare HMO

## 2021-08-30 ENCOUNTER — Encounter: Payer: Self-pay | Admitting: Emergency Medicine

## 2021-08-30 ENCOUNTER — Emergency Department: Payer: Medicare HMO

## 2021-08-30 DIAGNOSIS — Z20822 Contact with and (suspected) exposure to covid-19: Secondary | ICD-10-CM | POA: Diagnosis present

## 2021-08-30 DIAGNOSIS — R319 Hematuria, unspecified: Secondary | ICD-10-CM

## 2021-08-30 DIAGNOSIS — Q613 Polycystic kidney, unspecified: Secondary | ICD-10-CM

## 2021-08-30 DIAGNOSIS — I120 Hypertensive chronic kidney disease with stage 5 chronic kidney disease or end stage renal disease: Secondary | ICD-10-CM | POA: Diagnosis present

## 2021-08-30 DIAGNOSIS — F319 Bipolar disorder, unspecified: Secondary | ICD-10-CM | POA: Diagnosis present

## 2021-08-30 DIAGNOSIS — D631 Anemia in chronic kidney disease: Secondary | ICD-10-CM | POA: Diagnosis present

## 2021-08-30 DIAGNOSIS — Z823 Family history of stroke: Secondary | ICD-10-CM | POA: Diagnosis not present

## 2021-08-30 DIAGNOSIS — Z8249 Family history of ischemic heart disease and other diseases of the circulatory system: Secondary | ICD-10-CM | POA: Diagnosis not present

## 2021-08-30 DIAGNOSIS — Z992 Dependence on renal dialysis: Secondary | ICD-10-CM | POA: Diagnosis not present

## 2021-08-30 DIAGNOSIS — I248 Other forms of acute ischemic heart disease: Secondary | ICD-10-CM | POA: Diagnosis present

## 2021-08-30 DIAGNOSIS — Z880 Allergy status to penicillin: Secondary | ICD-10-CM

## 2021-08-30 DIAGNOSIS — Z803 Family history of malignant neoplasm of breast: Secondary | ICD-10-CM

## 2021-08-30 DIAGNOSIS — I214 Non-ST elevation (NSTEMI) myocardial infarction: Secondary | ICD-10-CM | POA: Diagnosis present

## 2021-08-30 DIAGNOSIS — Z8719 Personal history of other diseases of the digestive system: Secondary | ICD-10-CM

## 2021-08-30 DIAGNOSIS — I444 Left anterior fascicular block: Secondary | ICD-10-CM | POA: Diagnosis present

## 2021-08-30 DIAGNOSIS — Z885 Allergy status to narcotic agent status: Secondary | ICD-10-CM | POA: Diagnosis not present

## 2021-08-30 DIAGNOSIS — R4182 Altered mental status, unspecified: Secondary | ICD-10-CM

## 2021-08-30 DIAGNOSIS — Z66 Do not resuscitate: Secondary | ICD-10-CM | POA: Diagnosis present

## 2021-08-30 DIAGNOSIS — R778 Other specified abnormalities of plasma proteins: Secondary | ICD-10-CM | POA: Diagnosis not present

## 2021-08-30 DIAGNOSIS — R531 Weakness: Secondary | ICD-10-CM | POA: Diagnosis present

## 2021-08-30 DIAGNOSIS — G9341 Metabolic encephalopathy: Secondary | ICD-10-CM | POA: Diagnosis present

## 2021-08-30 DIAGNOSIS — K219 Gastro-esophageal reflux disease without esophagitis: Secondary | ICD-10-CM | POA: Diagnosis present

## 2021-08-30 DIAGNOSIS — R001 Bradycardia, unspecified: Secondary | ICD-10-CM | POA: Diagnosis present

## 2021-08-30 DIAGNOSIS — I953 Hypotension of hemodialysis: Secondary | ICD-10-CM | POA: Diagnosis present

## 2021-08-30 DIAGNOSIS — R000 Tachycardia, unspecified: Secondary | ICD-10-CM

## 2021-08-30 DIAGNOSIS — E876 Hypokalemia: Secondary | ICD-10-CM | POA: Diagnosis present

## 2021-08-30 DIAGNOSIS — N186 End stage renal disease: Secondary | ICD-10-CM | POA: Diagnosis present

## 2021-08-30 DIAGNOSIS — Z79899 Other long term (current) drug therapy: Secondary | ICD-10-CM

## 2021-08-30 DIAGNOSIS — K861 Other chronic pancreatitis: Secondary | ICD-10-CM | POA: Diagnosis present

## 2021-08-30 DIAGNOSIS — G2581 Restless legs syndrome: Secondary | ICD-10-CM | POA: Diagnosis present

## 2021-08-30 DIAGNOSIS — Z888 Allergy status to other drugs, medicaments and biological substances status: Secondary | ICD-10-CM

## 2021-08-30 DIAGNOSIS — I160 Hypertensive urgency: Principal | ICD-10-CM | POA: Diagnosis present

## 2021-08-30 DIAGNOSIS — R42 Dizziness and giddiness: Secondary | ICD-10-CM | POA: Diagnosis present

## 2021-08-30 DIAGNOSIS — N2581 Secondary hyperparathyroidism of renal origin: Secondary | ICD-10-CM | POA: Diagnosis present

## 2021-08-30 DIAGNOSIS — Z886 Allergy status to analgesic agent status: Secondary | ICD-10-CM

## 2021-08-30 LAB — CBC
HCT: 31.9 % — ABNORMAL LOW (ref 36.0–46.0)
Hemoglobin: 10.6 g/dL — ABNORMAL LOW (ref 12.0–15.0)
MCH: 32.7 pg (ref 26.0–34.0)
MCHC: 33.2 g/dL (ref 30.0–36.0)
MCV: 98.5 fL (ref 80.0–100.0)
Platelets: 184 10*3/uL (ref 150–400)
RBC: 3.24 MIL/uL — ABNORMAL LOW (ref 3.87–5.11)
RDW: 13.3 % (ref 11.5–15.5)
WBC: 14.7 10*3/uL — ABNORMAL HIGH (ref 4.0–10.5)
nRBC: 0 % (ref 0.0–0.2)

## 2021-08-30 LAB — COMPREHENSIVE METABOLIC PANEL
ALT: 12 U/L (ref 0–44)
AST: 22 U/L (ref 15–41)
Albumin: 3.4 g/dL — ABNORMAL LOW (ref 3.5–5.0)
Alkaline Phosphatase: 71 U/L (ref 38–126)
Anion gap: 15 (ref 5–15)
BUN: 14 mg/dL (ref 8–23)
CO2: 24 mmol/L (ref 22–32)
Calcium: 8.7 mg/dL — ABNORMAL LOW (ref 8.9–10.3)
Chloride: 102 mmol/L (ref 98–111)
Creatinine, Ser: 3.31 mg/dL — ABNORMAL HIGH (ref 0.44–1.00)
GFR, Estimated: 14 mL/min — ABNORMAL LOW (ref >60)
Glucose, Bld: 150 mg/dL — ABNORMAL HIGH (ref 70–99)
Potassium: 3.3 mmol/L — ABNORMAL LOW (ref 3.5–5.1)
Sodium: 141 mmol/L (ref 135–145)
Total Bilirubin: 0.5 mg/dL (ref 0.3–1.2)
Total Protein: 6.5 g/dL (ref 6.5–8.1)

## 2021-08-30 LAB — TROPONIN I (HIGH SENSITIVITY)
Troponin I (High Sensitivity): 77 ng/L — ABNORMAL HIGH (ref <18)
Troponin I (High Sensitivity): 527 ng/L (ref <18)

## 2021-08-30 LAB — APTT: aPTT: 31 seconds (ref 24–36)

## 2021-08-30 LAB — RESP PANEL BY RT-PCR (FLU A&B, COVID) ARPGX2
Influenza A by PCR: NEGATIVE
Influenza B by PCR: NEGATIVE
SARS Coronavirus 2 by RT PCR: NEGATIVE

## 2021-08-30 LAB — PROTIME-INR
INR: 1.2 (ref 0.8–1.2)
Prothrombin Time: 14.7 seconds (ref 11.4–15.2)

## 2021-08-30 MED ORDER — ALPRAZOLAM 0.25 MG PO TABS: 0.2500 mg | ORAL_TABLET | Freq: Two times a day (BID) | ORAL | Status: DC | PRN

## 2021-08-30 MED ORDER — FAMOTIDINE 20 MG PO TABS
20.0000 mg | ORAL_TABLET | Freq: Every day | ORAL | Status: DC
  Administered 2021-08-31 – 2021-09-01 (×3): 20 mg via ORAL
  Filled 2021-08-30 (×3): qty 1

## 2021-08-30 MED ORDER — SODIUM CHLORIDE 0.9 % IV BOLUS
500.0000 mL | Freq: Once | INTRAVENOUS | Status: AC
  Administered 2021-08-30: 500 mL via INTRAVENOUS

## 2021-08-30 MED ORDER — ASPIRIN EC 81 MG PO TBEC
81.0000 mg | DELAYED_RELEASE_TABLET | Freq: Every day | ORAL | Status: DC
  Administered 2021-08-31 – 2021-09-02 (×3): 81 mg via ORAL
  Filled 2021-08-30 (×3): qty 1

## 2021-08-30 MED ORDER — DICLOFENAC SODIUM 1 % EX GEL
2.0000 g | Freq: Three times a day (TID) | CUTANEOUS | Status: DC | PRN
  Filled 2021-08-30: qty 100

## 2021-08-30 MED ORDER — NETARSUDIL-LATANOPROST 0.02-0.005 % OP SOLN
1.0000 [drp] | Freq: Every day | OPHTHALMIC | Status: DC
Start: 2021-08-30 — End: 2021-08-31

## 2021-08-30 MED ORDER — ATORVASTATIN CALCIUM 80 MG PO TABS
80.0000 mg | ORAL_TABLET | Freq: Every day | ORAL | Status: DC
  Administered 2021-08-30 – 2021-09-02 (×4): 80 mg via ORAL
  Filled 2021-08-30: qty 1
  Filled 2021-08-30: qty 4
  Filled 2021-08-30: qty 1
  Filled 2021-08-30: qty 4

## 2021-08-30 MED ORDER — CARBAMAZEPINE 100 MG PO CHEW
100.0000 mg | CHEWABLE_TABLET | Freq: Two times a day (BID) | ORAL | Status: DC
  Administered 2021-08-31 – 2021-09-02 (×6): 100 mg via ORAL
  Filled 2021-08-30 (×8): qty 1

## 2021-08-30 MED ORDER — TIMOLOL MALEATE 0.5 % OP SOLN
1.0000 [drp] | Freq: Two times a day (BID) | OPHTHALMIC | Status: DC
  Administered 2021-08-31 – 2021-09-02 (×5): 1 [drp] via OPHTHALMIC
  Filled 2021-08-30: qty 5

## 2021-08-30 MED ORDER — FLUTICASONE PROPIONATE 50 MCG/ACT NA SUSP
1.0000 | Freq: Every day | NASAL | Status: DC
  Administered 2021-08-31 – 2021-09-02 (×3): 1 via NASAL
  Filled 2021-08-30: qty 16

## 2021-08-30 MED ORDER — SODIUM CHLORIDE 0.9 % IV SOLN: INTRAVENOUS | Status: DC

## 2021-08-30 MED ORDER — CALCIUM ACETATE (PHOS BINDER) 667 MG PO CAPS
667.0000 mg | ORAL_CAPSULE | Freq: Three times a day (TID) | ORAL | Status: DC
  Administered 2021-08-31 – 2021-09-01 (×6): 667 mg via ORAL
  Filled 2021-08-30 (×7): qty 1

## 2021-08-30 MED ORDER — ZIPRASIDONE HCL 20 MG PO CAPS
20.0000 mg | ORAL_CAPSULE | Freq: Every day | ORAL | Status: DC
  Administered 2021-08-31 – 2021-09-01 (×3): 20 mg via ORAL
  Filled 2021-08-30 (×4): qty 1

## 2021-08-30 MED ORDER — TRAZODONE HCL 50 MG PO TABS
25.0000 mg | ORAL_TABLET | Freq: Every day | ORAL | Status: DC
  Administered 2021-08-30: 25 mg via ORAL
  Filled 2021-08-30: qty 1

## 2021-08-30 MED ORDER — PANTOPRAZOLE SODIUM 40 MG PO TBEC
40.0000 mg | DELAYED_RELEASE_TABLET | Freq: Every day | ORAL | Status: DC
  Administered 2021-08-31 – 2021-09-01 (×2): 40 mg via ORAL
  Filled 2021-08-30 (×2): qty 1

## 2021-08-30 MED ORDER — NITROGLYCERIN 0.4 MG SL SUBL: 0.4000 mg | SUBLINGUAL_TABLET | SUBLINGUAL | Status: DC | PRN

## 2021-08-30 MED ORDER — ACETAMINOPHEN 325 MG PO TABS
650.0000 mg | ORAL_TABLET | ORAL | Status: DC | PRN
  Administered 2021-08-30 – 2021-09-01 (×2): 650 mg via ORAL
  Filled 2021-08-30 (×2): qty 2

## 2021-08-30 MED ORDER — TRAZODONE HCL 50 MG PO TABS: 25.0000 mg | ORAL_TABLET | Freq: Every evening | ORAL | Status: DC | PRN

## 2021-08-30 MED ORDER — DICYCLOMINE HCL 20 MG PO TABS
20.0000 mg | ORAL_TABLET | Freq: Three times a day (TID) | ORAL | Status: DC
  Administered 2021-08-31 – 2021-09-01 (×6): 20 mg via ORAL
  Filled 2021-08-30 (×10): qty 1

## 2021-08-30 MED ORDER — POLYVINYL ALCOHOL 1.4 % OP SOLN
3.0000 [drp] | Freq: Four times a day (QID) | OPHTHALMIC | Status: DC
  Administered 2021-08-31 – 2021-09-02 (×7): 3 [drp] via OPHTHALMIC
  Filled 2021-08-30: qty 15

## 2021-08-30 MED ORDER — ONDANSETRON HCL 4 MG/2ML IJ SOLN: 4.0000 mg | Freq: Four times a day (QID) | INTRAMUSCULAR | Status: DC | PRN

## 2021-08-30 MED ORDER — ASPIRIN 81 MG PO CHEW: 324.0000 mg | CHEWABLE_TABLET | ORAL | Status: AC

## 2021-08-30 MED ORDER — MONTELUKAST SODIUM 10 MG PO TABS
10.0000 mg | ORAL_TABLET | Freq: Every day | ORAL | Status: DC
  Administered 2021-08-31 – 2021-09-02 (×3): 10 mg via ORAL
  Filled 2021-08-30 (×3): qty 1

## 2021-08-30 MED ORDER — ASPIRIN 300 MG RE SUPP: 300.0000 mg | RECTAL | Status: AC

## 2021-08-30 MED ORDER — METOPROLOL TARTRATE 25 MG PO TABS
25.0000 mg | ORAL_TABLET | Freq: Two times a day (BID) | ORAL | Status: DC
  Administered 2021-08-30 – 2021-09-01 (×5): 25 mg via ORAL
  Filled 2021-08-30 (×6): qty 1

## 2021-08-30 MED ORDER — HEPARIN (PORCINE) 25000 UT/250ML-% IV SOLN
1000.0000 [IU]/h | INTRAVENOUS | Status: DC
  Administered 2021-08-30: 800 [IU]/h via INTRAVENOUS
  Administered 2021-08-31: 1000 [IU]/h via INTRAVENOUS
  Filled 2021-08-30 (×2): qty 250

## 2021-08-30 MED ORDER — HALOPERIDOL 2 MG PO TABS
2.5000 mg | ORAL_TABLET | ORAL | Status: DC
  Administered 2021-09-02: 2.5 mg via ORAL
  Filled 2021-08-30: qty 1

## 2021-08-30 MED ORDER — LORATADINE 10 MG PO TABS
10.0000 mg | ORAL_TABLET | Freq: Every day | ORAL | Status: DC
  Administered 2021-08-31 – 2021-09-02 (×3): 10 mg via ORAL
  Filled 2021-08-30 (×3): qty 1

## 2021-08-30 MED ORDER — BRIMONIDINE TARTRATE 0.2 % OP SOLN
1.0000 [drp] | Freq: Two times a day (BID) | OPHTHALMIC | Status: DC
  Administered 2021-08-31 – 2021-09-02 (×5): 1 [drp] via OPHTHALMIC
  Filled 2021-08-30: qty 5

## 2021-08-30 MED ORDER — ROPINIROLE HCL 1 MG PO TABS
2.0000 mg | ORAL_TABLET | Freq: Every day | ORAL | Status: DC
  Administered 2021-08-31 – 2021-09-01 (×3): 2 mg via ORAL
  Filled 2021-08-30 (×4): qty 2

## 2021-08-30 MED ORDER — VITAMIN B-12 1000 MCG PO TABS
1000.0000 ug | ORAL_TABLET | Freq: Every day | ORAL | Status: DC
  Administered 2021-08-31 – 2021-09-01 (×2): 1000 ug via ORAL
  Filled 2021-08-30 (×2): qty 1

## 2021-08-30 MED ORDER — MAGNESIUM HYDROXIDE 400 MG/5ML PO SUSP: 30.0000 mL | Freq: Every day | ORAL | Status: DC | PRN

## 2021-08-30 MED ORDER — HEPARIN BOLUS VIA INFUSION
4000.0000 [IU] | Freq: Once | INTRAVENOUS | Status: AC
  Administered 2021-08-30: 4000 [IU] via INTRAVENOUS
  Filled 2021-08-30: qty 4000

## 2021-08-30 NOTE — ED Notes (Signed)
Pt thinks that she has an allergy to ASA, states "I think I do because I have not been taking it"

## 2021-08-30 NOTE — H&P (Addendum)
[place]   PATIENT NAME: Joyce [name]    MR#:  [id number]  DATE OF BIRTH:  [birth year]  DATE OF ADMISSION:  08/30/2021  PRIMARY CARE PHYSICIAN: [facility]   Patient is coming from: Home  REQUESTING/REFERRING PHYSICIAN: Harvest Dark, MD  CHIEF COMPLAINT:   Chief Complaint  Patient presents with   Weakness   Altered Mental Status    HISTORY OF PRESENT ILLNESS:  Joyce [name] is a 76 y.o. Caucasian female with medical history significant for bipolar disorder, ESRD on hemodialysis MWF, GERD, hypertension, pancreatitis and vertigo, who presented to the ER with acute onset of altered mental status with confusion and generalized fatigue during her hemodialysis session for which she was sent to the ER.  She admits to headache without dizziness or blurred vision.  No nausea or vomiting or abdominal pain.  No chest pain or dyspnea or palpitations.  She denies any cough or wheezing.  She makes a small amount of urine.  ED Course: Upon presentation to the ER, BP was 179/77.  She was reportedly having a systolic BP of 469 earlier and was tachycardic.  Heart rate was 64 and later 115 and later 128.  Labs revealed a potassium of 3.3 and glucose of 150 with a BUN of 14 and creatinine 3.31.  Albumin was 3.4.  High sensitive troponin I was 77 and later 527.  CBC showed leukocytosis of 14.7 and anemia with hemoglobin 10.6 hematocrit 31.9 slightly worse than previous levels.Coagulation profile was within normal.  Influenza antigens and COVID-19 PCR came back negative.   EKG as reviewed by me : EKG showed sinus tachycardia with rate 109 with LAD and left anterior fascicular block and Q waves anteroseptally and laterally Imaging: Noncontrast head CT scan revealed no acute intracranial abnormalities. Brain MRI revealed mild advanced cerebral atrophy with no acute intracranial normalities.  The patient was given 500 mL IV normal saline and was started on IV heparin with bolus  and drip.  She will be admitted to a PCU bed for further evaluation and management. PAST MEDICAL HISTORY:   Past Medical History:  Diagnosis Date   Anal fissure    Bipolar affective disorder ([place])    CKD (chronic kidney disease)    Dr Holley Raring [name] 4   Colon polyps    Diverticulitis    Diverticulitis    Family history of adverse reaction to anesthesia    mom - PONV   GERD (gastroesophageal reflux disease)    Headache    migraines - none over 10 yrs   Heart murmur    History of hiatal hernia    Hypertension    Pancreatitis    Valproic acid   Vertigo     PAST SURGICAL HISTORY:   Past Surgical History:  Procedure Laterality Date   A/V FISTULAGRAM Left 04/14/2017   Procedure: A/V Fistulagram;  Surgeon: Katha Cabal, MD;  Location: [facility] [facility];  Service: Cardiovascular;  Laterality: Left;   A/V FISTULAGRAM Left 06/09/2017   Procedure: A/V FISTULAGRAM;  Surgeon: Katha Cabal, MD;  Location: [place] [facility];  Service: Cardiovascular;  Laterality: Left;   A/V FISTULAGRAM Left 11/02/2017   Procedure: A/V FISTULAGRAM;  Surgeon: Algernon Huxley, MD;  Location: [place] [facility];  Service: Cardiovascular;  Laterality: Left;   A/V FISTULAGRAM Left 12/22/2018   Procedure: A/V FISTULAGRAM;  Surgeon: Katha Cabal, MD;  Location: [place] [facility];  Service: Cardiovascular;  Laterality: Left;  A/V FISTULAGRAM Left 06/12/2020   Procedure: A/V FISTULAGRAM;  Surgeon: Katha Cabal, MD;  Location: [place] [facility];  Service: Cardiovascular;  Laterality: Left;   A/V FISTULAGRAM Left 01/22/2021   Procedure: A/V FISTULAGRAM;  Surgeon: Katha Cabal, MD;  Location: [place] [facility];  Service: Cardiovascular;  Laterality: Left;   A/V SHUNT INTERVENTION N/A 11/02/2017   Procedure: A/V SHUNT INTERVENTION;  Surgeon: Algernon Huxley, MD;  Location: [place] [facility];  Service: Cardiovascular;  Laterality: N/A;   ABDOMINAL HYSTERECTOMY  1990 ?   AV FISTULA  PLACEMENT  4/30   BREAST EXCISIONAL BIOPSY Left 1994   neg surgical bx   CHOLECYSTECTOMY  2003   COLONOSCOPY  2014   Dr. Jamal Collin   COLONOSCOPY WITH PROPOFOL N/A 09/24/2015   Procedure: COLONOSCOPY WITH random colon byopies.;  Surgeon: Lucilla Lame, MD;  Location: [place] [place];  Service: Endoscopy;  Laterality: N/A;   ESOPHAGOGASTRODUODENOSCOPY (EGD) WITH PROPOFOL N/A 09/24/2015   Procedure: ESOPHAGOGASTRODUODENOSCOPY (EGD) ;  Surgeon: Lucilla Lame, MD;  Location: [place];  Service: Endoscopy;  Laterality: N/A;   ESOPHAGOGASTRODUODENOSCOPY (EGD) WITH PROPOFOL N/A 12/11/2020   Procedure: ESOPHAGOGASTRODUODENOSCOPY (EGD) WITH PROPOFOL;  Surgeon: Lucilla Lame, MD;  Location[facility];  Service: Endoscopy;  Laterality: N/A;   EYE SURGERY     PERIPHERAL VASCULAR CATHETERIZATION N/A 05/29/2015   Procedure: A/V Shuntogram/Fistulagram;  Surgeon: Katha Cabal, MD;  Location: [place] [facility];  Service: Cardiovascular;  Laterality: N/A;   PERIPHERAL VASCULAR CATHETERIZATION N/A 05/29/2015   Procedure: A/V Shunt Intervention;  Surgeon: Katha Cabal, MD;  Location: [place] [facility];  Service: Cardiovascular;  Laterality: N/A;   POLYPECTOMY  09/24/2015   Procedure: POLYPECTOMY INTESTINAL;  Surgeon: Lucilla Lame, MD;  Location: [place];  Service: Endoscopy;;  cecal polyp ascending polyp   REVISON OF ARTERIOVENOUS FISTULA Left 08/09/2021   Procedure: REVISON OF ARTERIOVENOUS FISTULA ( BRACHIAL CEPHALIC);  Surgeon: Katha Cabal, MD;  Location: [facility];  Service: Vascular;  Laterality: Left;   UPPER EXTREMITY ANGIOGRAM Left 08/09/2021   Procedure: UPPER EXTREMITY ANGIOGRAM ( POSSIBLE INTERVENTION);  Surgeon: Katha Cabal, MD;  Location: [facility];  Service: Vascular;  Laterality: Left;   UPPER EXTREMITY ANGIOGRAPHY Left 07/02/2021   Procedure: UPPER EXTREMITY ANGIOGRAPHY;  Surgeon: Katha Cabal, MD;  Location: [place] [facility];  Service:  Cardiovascular;  Laterality: Left;    SOCIAL HISTORY:   Social History   Tobacco Use   Smoking status: Never   Smokeless tobacco: Never  Substance Use Topics   Alcohol use: No    Alcohol/week: 0.0 standard drinks    FAMILY HISTORY:   Family History  Problem Relation Age of Onset   Stroke Father    Hypertension Father    Breast cancer Other    Colon cancer Neg Hx    Liver disease Neg Hx    Mental illness Neg Hx     DRUG ALLERGIES:   Allergies  Allergen Reactions   Morphine And Related Shortness Of Breath    Pt reports chest pain and difficulty breathing.   Gabapentin     PER MAR   Indomethacin Hives   Penicillins Other (See Comments)    PER MAR   Pollen Extract Other (See Comments)    Sinus problems and HA    REVIEW OF SYSTEMS:   ROS As per history of present illness. All pertinent systems were reviewed above. Constitutional, HEENT, cardiovascular, respiratory, GI, GU, musculoskeletal, neuro, psychiatric,  endocrine, integumentary and hematologic systems were reviewed and are otherwise negative/unremarkable except for positive findings mentioned above in the HPI.   MEDICATIONS AT HOME:   Prior to Admission medications   Medication Sig Start Date End Date Taking? Authorizing Provider  acetaminophen (TYLENOL) 500 MG tablet Take 1,000 mg by mouth daily as needed for moderate pain.    [provider]  brimonidine (ALPHAGAN) 0.2 % ophthalmic solution Place 1 drop into both eyes 2 (two) times daily. Patient taking differently: Place 1 drop into both eyes 2 (two) times daily. (0600 & 2000) 10/22/17   Clapacs, [name], MD  calcium acetate (PHOSLO) 667 MG capsule Take 667 mg by mouth 3 (three) times daily with meals. 01/04/19   [provider]  carbamazepine (TEGRETOL) 100 MG chewable tablet Chew 100 mg by mouth 2 (two) times daily. (0600 & 2100)    [provider]  Carboxymethylcellulose Sod PF 1 % GEL Place 3 drops into the right eye 4 (four)  times daily.    [provider]  Dexlansoprazole 30 MG capsule Take 30 mg by mouth daily. (0630)    [provider]  diclofenac Sodium (VOLTAREN) 1 % GEL Apply 2 g topically 3 (three) times daily as needed (pain). 12/20/20   [provider]  dicyclomine (BENTYL) 20 MG tablet Take 20 mg by mouth 3 (three) times daily after meals.    [provider]  famotidine (PEPCID) 40 MG tablet Take 40 mg by mouth at bedtime. (2000) 07/25/20   [provider]  fluticasone (FLONASE) 50 MCG/ACT nasal spray Place 1 spray into both nostrils in the morning. (0700)    [provider]  haloperidol (HALDOL) 5 MG tablet TAKE 1/2 TABLET THREE TIMES WEEKLY ON MONDAY, [place]    [provider]  lidocaine-prilocaine (EMLA) cream APPLY TO ACCESS AREA [place] / WEDNESDAY / FRIDAY    [provider]  loratadine (CLARITIN) 10 MG tablet Take 10 mg by mouth daily.    [provider]  montelukast (SINGULAIR) 10 MG tablet Take 10 mg by mouth daily.    [provider]  Netarsudil-Latanoprost (ROCKLATAN) 0.02-0.005 % SOLN INSTILL 1 DROP INTO EACH EYE AT BEDTIME    [provider]  ondansetron (ZOFRAN) 4 MG tablet Take 4 mg by mouth every 6 (six) hours as needed for nausea or vomiting.    [provider]  rOPINIRole (REQUIP) 2 MG tablet Take 2 mg by mouth at bedtime. 04/25/21   [provider]  timolol (TIMOPTIC) 0.5 % ophthalmic solution Place 1 drop into both eyes 2 (two) times daily.    [provider]  traMADol (ULTRAM) 50 MG tablet Take 1 tablet (50 mg total) by mouth every 6 (six) hours as needed. 08/09/21 08/09/22  Schnier, Dolores Lory, MD  traZODone (DESYREL) 50 MG tablet Take 25 mg by mouth at bedtime.    [provider]  vitamin B-12 (CYANOCOBALAMIN) 1000 MCG tablet Take 1,000 mcg by mouth in the morning. (0600)    [provider]  ziprasidone  (GEODON) 20 MG capsule Take 20 mg by mouth at bedtime. (2100)    [provider]      VITAL SIGNS:  Blood pressure (!) 143/67, pulse (!) 124, temperature 99.4 F (37.4 C), resp. rate 19, height 5\' 3"  (1.6 m), weight 67 kg, SpO2 96 %.  PHYSICAL EXAMINATION:  Physical Exam  GENERAL:  76 y.o.-year-old Caucasian female patient lying in the  bed with no acute distress.  EYES: Pupils equal, round, reactive to light and accommodation. No scleral icterus. Extraocular muscles intact.  HEENT: Head atraumatic, normocephalic. Oropharynx and nasopharynx clear.  NECK:  Supple, no jugular venous distention. No thyroid enlargement, no tenderness.  LUNGS: Normal breath sounds bilaterally, no wheezing, rales,rhonchi or crepitation. No use of accessory muscles of respiration.  CARDIOVASCULAR: Regular rate and rhythm, S1, S2 normal. No murmurs, rubs, or gallops.  ABDOMEN: Soft, nondistended, nontender. Bowel sounds present. No organomegaly or mass.  EXTREMITIES: No pedal edema, cyanosis, or clubbing.  NEUROLOGIC: Cranial nerves II through XII are intact. Muscle strength 5/5 in all extremities. Sensation intact. Gait not checked.  PSYCHIATRIC: The patient is alert and oriented x 3.  Normal affect and good eye contact. SKIN: No obvious rash, lesion, or ulcer.   LABORATORY PANEL:   CBC Recent Labs  Lab 08/30/21 1514  WBC 14.7*  HGB 10.6*  HCT 31.9*  PLT 184   ------------------------------------------------------------------------------------------------------------------  Chemistries  Recent Labs  Lab 08/30/21 1514  NA 141  K 3.3*  CL 102  CO2 24  GLUCOSE 150*  BUN 14  CREATININE 3.31*  CALCIUM 8.7*  AST 22  ALT 12  ALKPHOS 71  BILITOT 0.5   ------------------------------------------------------------------------------------------------------------------  Cardiac Enzymes No results for input(s): TROPONINI in the last 168  hours. ------------------------------------------------------------------------------------------------------------------  RADIOLOGY:  CT HEAD WO CONTRAST (5MM)  Result Date: 08/30/2021 CLINICAL DATA:  Mental status change, unknown cause.  Weakness. EXAM: CT HEAD WITHOUT CONTRAST TECHNIQUE: Contiguous axial images were obtained from the base of the skull through the vertex without intravenous contrast. RADIATION DOSE REDUCTION: This exam was performed according to the departmental dose-optimization program which includes automated exposure control, adjustment of the mA and/or kV according to patient size and/or use of iterative reconstruction technique. COMPARISON:  CT head 12/14/2020. FINDINGS: Brain: There is no evidence of acute intracranial hemorrhage, mass lesion, brain edema or extra-axial fluid collection. Stable atrophy with prominence of the subarachnoid spaces. No ventriculomegaly. Stable mild chronic small vessel ischemic changes in the periventricular white matter. There is no CT evidence of acute cortical infarction. Vascular:  No hyperdense vessel identified. Skull: Negative for fracture or focal lesion. Sinuses/Orbits: The visualized paranasal sinuses and mastoid air cells are clear. No orbital abnormalities are seen. Other: None. IMPRESSION: Stable noncontrast head CT.  No acute intracranial findings. Electronically Signed   By: Richardean Sale M.D.   On: 08/30/2021 15:44      IMPRESSION AND PLAN:  Principal Problem:   NSTEMI (non-ST elevated myocardial infarction) ([place])  1.  Elevated troponin I with altered mental status, concerning for non-STEMI.  Given associated sinus tachycardia will need to rule out PE. - The patient be admitted to a PCU bed. - We will continue her on IV heparin drip that was started in the ER. - We will follow serial troponins. - We will obtain a VQ scan to rule out PE. - We will obtain a 2D echo. - Cardiology consult will be obtained. - I notified Dr.  Aundra Dubin about the patient. - She is allergic to NSAIDs and therefore we will give her Plavix for now.. - She will be placed on high-dose statin.  2.  Hypokalemia. - Potassium will be replaced and magnesium level will be checked.  3.  End-stage renal disease on hemodialysis. - Nephrology consult will be obtained for follow-up. - I notified Dr. Juleen China about the patient.  4.  Bipolar disorder. - We will continue Tegretol and Geodon.  5.  GERD. - We will continue H2 blocker therapy.  6.  Restless leg syndrome. - We will continue Requip.    DVT prophylaxis: Lovenox. Advanced Care Planning:  Code Status: full code. Family Communication:  The plan of care was discussed in details with the patient (and family). I answered all questions. The patient agreed to proceed with the above mentioned plan. Further management will depend upon hospital course. Disposition Plan: Back to previous home environment Consults called: Cardiology and nephrology.   All the records are reviewed and case discussed with ED provider.  Status is: Inpatient  At the time of the admission, it appears that the appropriate admission status for this patient is inpatient.  This is judged to be reasonable and necessary in order to provide the required intensity of service to ensure the patient's safety given the presenting symptoms, physical exam findings and initial radiographic and laboratory data in the context of comorbid conditions.  The patient requires inpatient status due to high intensity of service, high risk of further deterioration and high frequency of surveillance required.  I certify that at the time of admission, it is my clinical judgment that the patient will require inpatient hospital care extending more than 2 midnights.                            Dispo: The patient is from: Home              Anticipated d/c is to: Home              Patient currently is not medically stable to d/c.               Difficult to place patient: No   Christel Mormon M.D on 08/30/2021 at 7:26 PM  [facility]   From 7 PM-7 AM, contact night-coverage [email or web address]  CC: Primary care physician; [facility]

## 2021-08-30 NOTE — ED Provider Notes (Signed)
[facility] Provider Note    Event Date/Time   First MD Initiated Contact with Patient 08/30/21 1506     (approximate)  History   Chief Complaint: Weakness and Altered Mental Status  HPI  Joyce [name] is a 76 y.o. female with a past medical history of ESRD on HD Monday/Wednesday/Friday presents to the emergency department for confusion, hypertension and tachycardia.  According to EMS report patient was feeling well today, went to dialysis and during dialysis her blood pressure shot up to 366 systolic.  Patient became lethargic and somewhat confused which is atypical for her.  Here the patient is somewhat somnolent but will awaken to voice.  Is able to tell me her name able to tell me the year but she was not able to tell me the hospital location.  Patient denies any pain, states she feels tired and states she feels thirsty.  Denies any headache.  Patient will fall asleep if you are not actively engaging her.  Physical Exam   Triage Vital Signs: ED Triage Vitals  Enc Vitals Group     BP 08/30/21 1508 (!) 146/62     Pulse Rate 08/30/21 1508 (!) 115     Resp 08/30/21 1508 20     Temp 08/30/21 1508 99.4 F (37.4 C)     Temp src --      SpO2 08/30/21 1508 96 %     Weight 08/30/21 1509 147 lb 11.3 oz (67 kg)     Height 08/30/21 1509 5\' 3"  (1.6 m)     Head Circumference --      Peak Flow --      Pain Score 08/30/21 1508 5     Pain Loc --      Pain Edu? --      Excl. in [place]? --     Most recent vital signs: Vitals:   08/30/21 1508  BP: (!) 146/62  Pulse: (!) 115  Resp: 20  Temp: 99.4 F (37.4 C)  SpO2: 96%    General: Somnolent but awakens to voice answers questions appropriately cannot tell me which hospital she is at. CV:  Good peripheral perfusion.  Regular rate and rhythm  Resp:  Normal effort.  Equal breath sounds bilaterally.  Abd:  No distention.  Soft, nontender.  No rebound or guarding.    ED Results / Procedures / Treatments    EKG  EKG viewed and interpreted by myself shows sinus tachycardia at 109 bpm with a narrow QRS, normal axis, normal intervals, no concerning ST changes.  RADIOLOGY  I personally reviewed the CT head images.  No acute significant abnormality seen on my evaluation.  Radiology has read the CT as negative for acute abnormality   MEDICATIONS ORDERED IN ED: Medications  sodium chloride 0.9 % bolus 500 mL (has no administration in time range)     IMPRESSION / MDM / ASSESSMENT AND PLAN / ED COURSE  I reviewed the triage vital signs and the nursing notes.  Patient presents to the emergency department from her dialysis for altered mental status.  Patient is more somnolent but answers questions appropriately.  Awakens to voice.  We will check labs, CT scan of the head and.  We will gently hydrate the patient with 500 cc of normal saline and continue to closely monitor while awaiting results.  Patient agreeable to plan of care.  Patient's labs have resulted showing initial troponin of 77.  However as the patient is on dialysis this  could very likely be normal for the patient however in comparison to the patient's lab work from 1 year ago this is elevated.  Repeat troponin shows significant elevation to 527.  Differential at this time would include NSTEMI first prior CVA.  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  No family here for input.  Remainder the patient's lab work is nonrevealing, COVID-negative, creatinine 3.3 but the patient is on dialysis.  White blood cell count is slightly elevated at 14,000.  Given the elevated troponin increasing significant from 2 hours we will start the patient on a heparin infusion.  We will obtain an MRI of the brain to evaluate for possible CVA and continue to trend troponins.  Patient  will require admission to the hospital service for further work-up and treatment.  Patient remains somewhat tachycardic around 120 bpm remainder of the vitals are reassuring.  CRITICAL CARE Performed by: Harvest Dark   Total critical care time: 30 minutes  Critical care time was exclusive of separately billable procedures and treating other patients.  Critical care was necessary to treat or prevent imminent or life-threatening deterioration.  Critical care was time spent personally by me on the following activities: development of treatment plan with patient and/or surrogate as well as nursing, discussions with consultants, evaluation of patient's response to treatment, examination of patient, obtaining history from patient or surrogate, ordering and performing treatments and interventions, ordering and review of laboratory studies, ordering and review of radiographic studies, pulse oximetry and re-evaluation of patient's condition.   FINAL CLINICAL IMPRESSION(S) / ED DIAGNOSES   Altered mental status NSTEMI  Note:  This document was prepared using Dragon voice recognition software and may include unintentional dictation errors.   Harvest Dark, MD 08/30/21 [phone]

## 2021-08-30 NOTE — ED Notes (Signed)
Patient transported to MRI 

## 2021-08-30 NOTE — Consult Note (Signed)
ANTICOAGULATION CONSULT NOTE - Initial Consult  Pharmacy Consult for heparin Indication: chest pain/ACS  Allergies  Allergen Reactions   Morphine And Related Shortness Of Breath    Pt reports chest pain and difficulty breathing.   Gabapentin     PER MAR   Indomethacin Hives   Penicillins Other (See Comments)    PER MAR   Pollen Extract Other (See Comments)    Sinus problems and HA    Patient Measurements: Height: 5\' 3"  (160 cm) Weight: 67 kg (147 lb 11.3 oz) IBW/kg (Calculated) : 52.4 Heparin Dosing Weight: 65.9 kg  Vital Signs: Temp: 99.4 F (37.4 C) (02/10 1508) BP: 143/67 (02/10 1900) Pulse Rate: 124 (02/10 1900)  Labs: Recent Labs    08/30/21 1514 08/30/21 1741  HGB 10.6*  --   HCT 31.9*  --   PLT 184  --   CREATININE 3.31*  --   TROPONINIHS 77* 527*    Estimated Creatinine Clearance: 13.7 mL/min (A) (by C-G formula based on SCr of 3.31 mg/dL (H)).   Medical History: Past Medical History:  Diagnosis Date   Anal fissure    Bipolar affective disorder ([place])    CKD (chronic kidney disease)    Dr Holley Raring [name] 4   Colon polyps    Diverticulitis    Diverticulitis    Family history of adverse reaction to anesthesia    mom - PONV   GERD (gastroesophageal reflux disease)    Headache    migraines - none over 10 yrs   Heart murmur    History of hiatal hernia    Hypertension    Pancreatitis    Valproic acid   Vertigo     Medications:  No PTA anticoagulation or antiplatelets   Assessment: 76 y.o. female with a past medical history of ESRD on HD presents to the emergency department for confusion, hypertension and tachycardia. Troponin elevated to 527. Pharmacy has been consulted for heparin dosing for ACS.   Baseline labs: Hgb 10.6, plts 184, aPTT and INR ordered  Goal of Therapy:  Heparin level 0.3-0.7 units/ml Monitor platelets by anticoagulation protocol: Yes   Plan:  Give 4000 units bolus x 1 Start heparin infusion at 800 units/hr Check  anti-Xa level in 8 hours and daily while on heparin Continue to monitor H&H and platelets  Darnelle Bos, PharmD 08/30/2021,7:19 PM

## 2021-08-30 NOTE — ED Triage Notes (Signed)
Pt to ER via EMS from dialysis.  During dialysis pt became lethargic, c/o being cold and became hypertensive and tachycardic.

## 2021-08-30 NOTE — ED Notes (Signed)
Missing dose message sent to pharmacy for Heparin

## 2021-08-31 ENCOUNTER — Inpatient Hospital Stay: Payer: Medicare HMO

## 2021-08-31 ENCOUNTER — Inpatient Hospital Stay (HOSPITAL_COMMUNITY)
Admit: 2021-08-31 | Discharge: 2021-08-31 | Disposition: A | Discharge status: Home / Self Care | Payer: Medicare HMO | Attending: Family Medicine | Admitting: Family Medicine

## 2021-08-31 ENCOUNTER — Encounter: Payer: Self-pay | Admitting: Family Medicine

## 2021-08-31 DIAGNOSIS — I214 Non-ST elevation (NSTEMI) myocardial infarction: Secondary | ICD-10-CM

## 2021-08-31 LAB — BASIC METABOLIC PANEL
Anion gap: 10 (ref 5–15)
BUN: 24 mg/dL — ABNORMAL HIGH (ref 8–23)
CO2: 27 mmol/L (ref 22–32)
Calcium: 8.9 mg/dL (ref 8.9–10.3)
Chloride: 102 mmol/L (ref 98–111)
Creatinine, Ser: 4.75 mg/dL — ABNORMAL HIGH (ref 0.44–1.00)
GFR, Estimated: 9 mL/min — ABNORMAL LOW (ref >60)
Glucose, Bld: 109 mg/dL — ABNORMAL HIGH (ref 70–99)
Potassium: 3.8 mmol/L (ref 3.5–5.1)
Sodium: 139 mmol/L (ref 135–145)

## 2021-08-31 LAB — ECHOCARDIOGRAM COMPLETE
AR max vel: 2.35 cm2
AV Peak grad: 13.1 mmHg
Ao pk vel: 1.81 m/s
Area-P 1/2: 2.78 cm2
Calc EF: 71.6 %
Height: 63 in
S' Lateral: 2.95 cm
Single Plane A2C EF: 72.1 %
Single Plane A4C EF: 71.9 %
Weight: 2363.33 oz

## 2021-08-31 LAB — CBC
HCT: 32.6 % — ABNORMAL LOW (ref 36.0–46.0)
Hemoglobin: 10.5 g/dL — ABNORMAL LOW (ref 12.0–15.0)
MCH: 31.9 pg (ref 26.0–34.0)
MCHC: 32.2 g/dL (ref 30.0–36.0)
MCV: 99.1 fL (ref 80.0–100.0)
Platelets: 207 10*3/uL (ref 150–400)
RBC: 3.29 MIL/uL — ABNORMAL LOW (ref 3.87–5.11)
RDW: 13.6 % (ref 11.5–15.5)
WBC: 16 10*3/uL — ABNORMAL HIGH (ref 4.0–10.5)
nRBC: 0 % (ref 0.0–0.2)

## 2021-08-31 LAB — LIPID PANEL
Cholesterol: 176 mg/dL (ref 0–200)
HDL: 65 mg/dL (ref >40)
LDL Cholesterol: 102 mg/dL — ABNORMAL HIGH (ref 0–99)
Total CHOL/HDL Ratio: 2.7 RATIO
Triglycerides: 46 mg/dL (ref <150)
VLDL: 9 mg/dL (ref 0–40)

## 2021-08-31 LAB — HEPARIN LEVEL (UNFRACTIONATED)
Heparin Unfractionated: 0.27 IU/mL — ABNORMAL LOW (ref 0.30–0.70)
Heparin Unfractionated: 0.22 IU/mL — ABNORMAL LOW (ref 0.30–0.70)
Heparin Unfractionated: 0.58 IU/mL (ref 0.30–0.70)

## 2021-08-31 MED ORDER — CLOPIDOGREL BISULFATE 75 MG PO TABS
75.0000 mg | ORAL_TABLET | Freq: Every day | ORAL | Status: DC
  Administered 2021-08-31 – 2021-09-02 (×3): 75 mg via ORAL
  Filled 2021-08-31 (×3): qty 1

## 2021-08-31 MED ORDER — HEPARIN BOLUS VIA INFUSION
1000.0000 [IU] | Freq: Once | INTRAVENOUS | Status: AC
  Administered 2021-08-31: 1000 [IU] via INTRAVENOUS
  Filled 2021-08-31: qty 1000

## 2021-08-31 MED ORDER — LATANOPROST 0.005 % OP SOLN
1.0000 [drp] | Freq: Every day | OPHTHALMIC | Status: DC
  Administered 2021-08-31 – 2021-09-01 (×2): 1 [drp] via OPHTHALMIC
  Filled 2021-08-31: qty 2.5

## 2021-08-31 MED ORDER — TECHNETIUM TO 99M ALBUMIN AGGREGATED
4.0000 | Freq: Once | INTRAVENOUS | Status: AC | PRN
  Administered 2021-08-31: 4.2 via INTRAVENOUS

## 2021-08-31 NOTE — Progress Notes (Signed)
[facility]  ROUNDING NOTE   Subjective:   Joyce [name] is a 76 year old female with a past medical history including hypertension, pancreatitis, GERD, vertigo, and end-stage renal disease on hemodialysis.  Patient presents to the emergency department with complaints of confusion and fatigue while receiving outpatient dialysis treatment.  Patient has been admitted for NSTEMI (non-ST elevated myocardial infarction) [facility]) [I21.4]  Patient is known to our clinic and receives outpatient treatments at [facility] on a MWF schedule, supervised by Dr. Holley Raring.  Patient currently sitting up in bed eating breakfast.  Patient has little recollection of events yesterday.  Chart review indicates patient became lethargic during treatment and was noted to complain being cold.  Also reports hypertension and tachycardia in outpatient setting.  Patient reports never experiencing the symptoms prior.  Denies nausea, vomiting, and diarrhea.  Denies shortness of breath, cough, chills, and fever.  Patient reports no other concerns previously during dialysis treatments.  Labs on arrival include potassium 3.3, glucose 150, and creatinine 3.31 with GFR 14.  Troponin 77 with increase to 527 4 hours later.  Brain MRI negative.  CT head negative.  Chest x-ray negative for pneumothorax and mild right atelectasis.   Objective:  Vital signs in last 24 hours:  Temp:  [99.4 F (37.4 C)] 99.4 F (37.4 C) (02/10 1508) Pulse Rate:  [72-129] 75 (02/11 0830) Resp:  [14-30] 17 (02/11 0830) BP: (95-163)/(46-88) 95/51 (02/11 0830) SpO2:  [94 %-99 %] 95 % (02/11 0830) Weight:  [67 kg] 67 kg (02/10 1509)  Weight change:  Filed Weights   08/30/21 1509  Weight: 67 kg    Intake/Output: No intake/output data recorded.   Intake/Output this shift:  Total I/O In: 805.3 [I.V.:805.3] Out: -   Physical Exam: General: NAD, sitting up in bed  Head: Normocephalic, atraumatic. Moist oral mucosal membranes  Eyes:  Anicteric  Lungs:  Clear to auscultation, normal effort  Heart: Regular rate and rhythm  Abdomen:  Soft, nontender  Extremities: Trace peripheral edema.  Neurologic: Nonfocal, moving all four extremities  Skin: No lesions  Access: LUE aVF    Basic Metabolic Panel: Recent Labs  Lab 08/30/21 1514 08/31/21 0532  NA 141 139  K 3.3* 3.8  CL 102 102  CO2 24 27  GLUCOSE 150* 109*  BUN 14 24*  CREATININE 3.31* 4.75*  CALCIUM 8.7* 8.9    Liver Function Tests: Recent Labs  Lab 08/30/21 1514  AST 22  ALT 12  ALKPHOS 71  BILITOT 0.5  PROT 6.5  ALBUMIN 3.4*   No results for input(s): LIPASE, AMYLASE in the last 168 hours. No results for input(s): AMMONIA in the last 168 hours.  CBC: Recent Labs  Lab 08/30/21 1514 08/31/21 0532  WBC 14.7* 16.0*  HGB 10.6* 10.5*  HCT 31.9* 32.6*  MCV 98.5 99.1  PLT 184 207    Cardiac Enzymes: No results for input(s): CKTOTAL, CKMB, CKMBINDEX, TROPONINI in the last 168 hours.  BNP: Invalid input(s): POCBNP  CBG: No results for input(s): GLUCAP in the last 168 hours.  Microbiology: Results for orders placed or performed during the hospital encounter of 08/30/21  Resp Panel by RT-PCR (Flu A&B, Covid) Nasopharyngeal Swab     Status: None   Collection Time: 08/30/21  3:14 PM   Specimen: Nasopharyngeal Swab; Nasopharyngeal(NP) swabs in vial transport medium  Result Value Ref Range Status   SARS Coronavirus 2 by RT PCR NEGATIVE NEGATIVE Final    Comment: (NOTE) SARS-CoV-2 target nucleic acids are NOT  DETECTED.  The SARS-CoV-2 RNA is generally detectable in upper respiratory specimens during the acute phase of infection. The lowest concentration of SARS-CoV-2 viral copies this assay can detect is 138 copies/mL. A negative result does not preclude SARS-Cov-2 infection and should not be used as the sole basis for treatment or other patient management decisions. A negative result may occur with  improper specimen collection/handling,  submission of specimen other than nasopharyngeal swab, presence of viral mutation(s) within the areas targeted by this assay, and inadequate number of viral copies(<138 copies/mL). A negative result must be combined with clinical observations, patient history, and epidemiological information. The expected result is Negative.  Fact Sheet for Patients:  EntrepreneurPulse.com.au  Fact Sheet for Healthcare Providers:  IncredibleEmployment.be  This test is no t yet approved or cleared by the [place] FDA and  has been authorized for detection and/or diagnosis of SARS-CoV-2 by FDA under an Emergency Use Authorization (EUA). This EUA will remain  in effect (meaning this test can be used) for the duration of the COVID-19 declaration under Section 564(b)(1) of the Act, 21 U.S.C.section 360bbb-3(b)(1), unless the authorization is terminated  or revoked sooner.       Influenza A by PCR NEGATIVE NEGATIVE Final   Influenza B by PCR NEGATIVE NEGATIVE Final    Comment: (NOTE) The Xpert Xpress SARS-CoV-2/FLU/RSV plus assay is intended as an aid in the diagnosis of influenza from Nasopharyngeal swab specimens and should not be used as a sole basis for treatment. Nasal washings and aspirates are unacceptable for Xpert Xpress SARS-CoV-2/FLU/RSV testing.  Fact Sheet for Patients: EntrepreneurPulse.com.au  Fact Sheet for Healthcare Providers: IncredibleEmployment.be  This test is not yet approved or cleared by the [place] FDA and has been authorized for detection and/or diagnosis of SARS-CoV-2 by FDA under an Emergency Use Authorization (EUA). This EUA will remain in effect (meaning this test can be used) for the duration of the COVID-19 declaration under Section 564(b)(1) of the Act, 21 U.S.C. section 360bbb-3(b)(1), unless the authorization is terminated or revoked.  Performed at [facility], [address] [place], [place] [address]     Coagulation Studies: Recent Labs    08/30/21 1949  LABPROT 14.7  INR 1.2    Urinalysis: No results for input(s): COLORURINE, LABSPEC, PHURINE, GLUCOSEU, HGBUR, BILIRUBINUR, KETONESUR, PROTEINUR, UROBILINOGEN, NITRITE, LEUKOCYTESUR in the last 72 hours.  Invalid input(s): APPERANCEUR    Imaging: CT HEAD WO CONTRAST (5MM)  Result Date: 08/30/2021 CLINICAL DATA:  Mental status change, unknown cause.  Weakness. EXAM: CT HEAD WITHOUT CONTRAST TECHNIQUE: Contiguous axial images were obtained from the base of the skull through the vertex without intravenous contrast. RADIATION DOSE REDUCTION: This exam was performed according to the departmental dose-optimization program which includes automated exposure control, adjustment of the mA and/or kV according to patient size and/or use of iterative reconstruction technique. COMPARISON:  CT head 12/14/2020. FINDINGS: Brain: There is no evidence of acute intracranial hemorrhage, mass lesion, brain edema or extra-axial fluid collection. Stable atrophy with prominence of the subarachnoid spaces. No ventriculomegaly. Stable mild chronic small vessel ischemic changes in the periventricular white matter. There is no CT evidence of acute cortical infarction. Vascular:  No hyperdense vessel identified. Skull: Negative for fracture or focal lesion. Sinuses/Orbits: The visualized paranasal sinuses and mastoid air cells are clear. No orbital abnormalities are seen. Other: None. IMPRESSION: Stable noncontrast head CT.  No acute intracranial findings. Electronically Signed   By: Richardean Sale M.D.   On: 08/30/2021 15:44  MR BRAIN WO CONTRAST  Result Date: 08/30/2021 CLINICAL DATA:  Initial evaluation for acute mental status change. EXAM: MRI HEAD WITHOUT CONTRAST TECHNIQUE: Multiplanar, multiecho pulse sequences of the brain and surrounding structures were obtained without intravenous contrast. COMPARISON:  Prior head CT  from earlier the same day. FINDINGS: Brain: Mildly advanced cerebral atrophy. Few scattered patchy foci of T2/FLAIR hyperintensity noted involving the supratentorial cerebral white matter, nonspecific, but overall mild for age, and felt to be within normal limits. No abnormal foci of restricted diffusion to suggest acute or subacute ischemia. Gray-white matter differentiation maintained. No areas of chronic cortical infarction. No acute or chronic intracranial hemorrhage. No mass lesion, midline shift or mass effect no hydrocephalus or extra-axial fluid collection. Pituitary gland suprasellar region normal. Vascular: Major intracranial vascular flow voids are maintained. Skull and upper cervical spine: Craniocervical junction within normal limits. Bone marrow signal intensity normal. No scalp soft tissue abnormality. Sinuses/Orbits: Prior bilateral ocular lens replacement. Mild scattered mucosal thickening noted throughout the paranasal sinuses. No significant mastoid effusion. Inner ear structures grossly normal. Other: None. IMPRESSION: 1. No acute intracranial abnormality. 2. Mildly advanced cerebral atrophy. Electronically Signed   By: Jeannine Boga M.D.   On: 08/30/2021 21:13   NM Pulmonary Perfusion  Result Date: 08/31/2021 CLINICAL DATA:  Altered mental status. Dialysis patient. Unable to obtain additional information. EXAM: NUCLEAR MEDICINE PERFUSION LUNG SCAN TECHNIQUE: Perfusion images were obtained in multiple projections after intravenous injection of radiopharmaceutical. Ventilation scans intentionally deferred if perfusion scan and chest x-ray adequate for interpretation during COVID 19 epidemic. RADIOPHARMACEUTICALS:  4.2 mCi Tc-93m MAA IV COMPARISON:  Current chest radiograph FINDINGS: Normal perfusion. There are no segmental areas decreased perfusion to suggest pulmonary thromboembolism. IMPRESSION: Negative exam.  No evidence of pulmonary thromboembolism. Electronically Signed   By:  Lajean Manes M.D.   On: 08/31/2021 10:13   DG Chest Portable 1 View  Result Date: 08/31/2021 CLINICAL DATA:  Altered mental status.  Dialysis patient. EXAM: PORTABLE CHEST 1 VIEW COMPARISON:  05/01/2021. FINDINGS: Cardiac silhouette is top-normal in size. No mediastinal or hilar masses. There are linear opacities at the right lung base consistent with atelectasis. Remainder of the lungs is clear. No convincing pleural effusion and no pneumothorax. Right posterolateral seventh rib fracture, which is of unclear chronicity, not evident on the most recent prior radiographs. Left subclavian to upper arm vascular stent is stable. IMPRESSION: 1. Right posterolateral seventh rib fracture of unclear chronicity, suspected to be subacute. No pneumothorax. 2. Mild linear right lung base atelectasis accentuated by low lung volumes. No convincing acute cardiopulmonary disease. Electronically Signed   By: Lajean Manes M.D.   On: 08/31/2021 10:21     Medications:    heparin 950 Units/hr (08/31/21 7858)    aspirin  324 mg Oral NOW   Or   aspirin  300 mg Rectal NOW   aspirin EC  81 mg Oral Daily   atorvastatin  80 mg Oral Daily   brimonidine  1 drop Both Eyes BID AC & HS   calcium acetate  667 mg Oral TID WC   carbamazepine  100 mg Oral BID   clopidogrel  75 mg Oral Daily   dicyclomine  20 mg Oral TID PC   famotidine  20 mg Oral QHS   fluticasone  1 spray Each Nare Daily   [START ON 09/02/2021] haloperidol  2.5 mg Oral Q M,W,F   loratadine  10 mg Oral Daily   metoprolol tartrate  25 mg Oral BID  montelukast  10 mg Oral Daily   Netarsudil-Latanoprost  1 drop Both Eyes QHS   pantoprazole  40 mg Oral Q breakfast   polyvinyl alcohol  3 drop Right Eye QID   rOPINIRole  2 mg Oral QHS   timolol  1 drop Both Eyes BID AC & HS   traZODone  25 mg Oral QHS   vitamin B-12  1,000 mcg Oral Q breakfast   ziprasidone  20 mg Oral QHS   acetaminophen, ALPRAZolam, diclofenac Sodium, magnesium hydroxide,  nitroGLYCERIN, ondansetron (ZOFRAN) IV, traZODone  Assessment/ Plan:  Ms. Sybil Shrader is a 76 y.o.  female a past medical history including hypertension, pancreatitis, GERD, vertigo, and end-stage renal disease on hemodialysis.  Patient presents to the emergency department with complaints of confusion and fatigue while receiving outpatient dialysis treatment.  Patient has been admitted for NSTEMI (non-ST elevated myocardial infarction) ([place]) [I21.4]  [facility]/MWF/LUE AVF/65.5 kg  End-stage renal disease on hemodialysis.  Will maintain outpatient schedule during admission.  Patient received full dialysis treatment prior to ED arrival.  Next treatment scheduled for Monday  2. Anemia of chronic kidney disease Normocytic Lab Results  Component Value Date   HGB 10.5 (L) 08/31/2021  [place] received outpatient Hemoglobin remains at goal  3. Secondary Hyperparathyroidism: with outpatient labs: phosphorus 3.9, calcium 9.7 on 08/31/21.   Lab Results  Component Value Date   CALCIUM 8.9 08/31/2021   CAION 1.16 08/09/2021   PHOS 4.1 05/23/2019  Calcitriol prescribed outpatient Calcium and phosphorus remain within acceptable target. Continue calcium acetate with meals.  4.  NSTEMI with elevated troponin. Heparin drip stopped due to bleeding. Cardiology following. 2D ECHO pending.  VQ scan negative    LOS: 1 [place] 2/11/202311:46 AM

## 2021-08-31 NOTE — ED Notes (Signed)
Patient transported to NM 

## 2021-08-31 NOTE — ED Notes (Signed)
Message sent to pharmacy for phoslo and bentyl doses

## 2021-08-31 NOTE — Progress Notes (Signed)
[place] [facility] [place] NAME: Joyce [name]    MR#:  [id number]  DATE OF BIRTH:  12-15-1945  SUBJECTIVE:   patient came in from dialysis center after she was found lethargic tachycardic and low blood pressure. No family at bedside \\patient  currently is awake alert oriented times three. She does not remember events of last evening. Denies any chest pain or shortness of breath. Blood pressure is stable sats are stable on room air.   VITALS:  Blood pressure (!) 177/63, pulse 71, temperature 99.1 F (37.3 C), resp. rate 18, height 5\' 3"  (1.6 m), weight 67 kg, SpO2 96 %.  PHYSICAL EXAMINATION:   GENERAL:  76 y.o.-year-old patient lying in the bed with no acute distress.  LUNGS: decreased breath sounds bilaterally, no wheezing, rales, rhonchi.  CARDIOVASCULAR: S1, S2 normal. No murmurs, rubs, or gallops.  ABDOMEN: Soft, nontender, nondistended. Bowel sounds present.  EXTREMITIES: No  edema b/l.   HD access NEUROLOGIC: nonfocal  patient is alert and awake SKIN: No obvious rash, lesion, or ulcer.   LABORATORY PANEL:  CBC Recent Labs  Lab 08/31/21 0532  WBC 16.0*  HGB 10.5*  HCT 32.6*  PLT 207    Chemistries  Recent Labs  Lab 08/30/21 1514 08/31/21 0532  NA 141 139  K 3.3* 3.8  CL 102 102  CO2 24 27  GLUCOSE 150* 109*  BUN 14 24*  CREATININE 3.31* 4.75*  CALCIUM 8.7* 8.9  AST 22  --   ALT 12  --   ALKPHOS 71  --   BILITOT 0.5  --    Cardiac Enzymes No results for input(s): TROPONINI in the last 168 hours. RADIOLOGY:  CT HEAD WO CONTRAST (5MM)  Result Date: 08/30/2021 CLINICAL DATA:  Mental status change, unknown cause.  Weakness. EXAM: CT HEAD WITHOUT CONTRAST TECHNIQUE: Contiguous axial images were obtained from the base of the skull through the vertex without intravenous contrast. RADIATION DOSE REDUCTION: This exam was performed according to the departmental dose-optimization program which includes automated exposure  control, adjustment of the mA and/or kV according to patient size and/or use of iterative reconstruction technique. COMPARISON:  CT head 12/14/2020. FINDINGS: Brain: There is no evidence of acute intracranial hemorrhage, mass lesion, brain edema or extra-axial fluid collection. Stable atrophy with prominence of the subarachnoid spaces. No ventriculomegaly. Stable mild chronic small vessel ischemic changes in the periventricular white matter. There is no CT evidence of acute cortical infarction. Vascular:  No hyperdense vessel identified. Skull: Negative for fracture or focal lesion. Sinuses/Orbits: The visualized paranasal sinuses and mastoid air cells are clear. No orbital abnormalities are seen. Other: None. IMPRESSION: Stable noncontrast head CT.  No acute intracranial findings. Electronically Signed   By: Richardean Sale M.D.   On: 08/30/2021 15:44   MR BRAIN WO CONTRAST  Result Date: 08/30/2021 CLINICAL DATA:  Initial evaluation for acute mental status change. EXAM: MRI HEAD WITHOUT CONTRAST TECHNIQUE: Multiplanar, multiecho pulse sequences of the brain and surrounding structures were obtained without intravenous contrast. COMPARISON:  Prior head CT from earlier the same day. FINDINGS: Brain: Mildly advanced cerebral atrophy. Few scattered patchy foci of T2/FLAIR hyperintensity noted involving the supratentorial cerebral white matter, nonspecific, but overall mild for age, and felt to be within normal limits. No abnormal foci of restricted diffusion to suggest acute or subacute ischemia. Gray-white matter differentiation maintained. No areas of chronic cortical infarction. No acute or chronic intracranial hemorrhage. No mass lesion, midline shift or mass effect  no hydrocephalus or extra-axial fluid collection. Pituitary gland suprasellar region normal. Vascular: Major intracranial vascular flow voids are maintained. Skull and upper cervical spine: Craniocervical junction within normal limits. Bone marrow  signal intensity normal. No scalp soft tissue abnormality. Sinuses/Orbits: Prior bilateral ocular lens replacement. Mild scattered mucosal thickening noted throughout the paranasal sinuses. No significant mastoid effusion. Inner ear structures grossly normal. Other: None. IMPRESSION: 1. No acute intracranial abnormality. 2. Mildly advanced cerebral atrophy. Electronically Signed   By: Jeannine Boga M.D.   On: 08/30/2021 21:13   NM Pulmonary Perfusion  Result Date: 08/31/2021 CLINICAL DATA:  Altered mental status. Dialysis patient. Unable to obtain additional information. EXAM: NUCLEAR MEDICINE PERFUSION LUNG SCAN TECHNIQUE: Perfusion images were obtained in multiple projections after intravenous injection of radiopharmaceutical. Ventilation scans intentionally deferred if perfusion scan and chest x-ray adequate for interpretation during COVID 19 epidemic. RADIOPHARMACEUTICALS:  4.2 mCi Tc-48m MAA IV COMPARISON:  Current chest radiograph FINDINGS: Normal perfusion. There are no segmental areas decreased perfusion to suggest pulmonary thromboembolism. IMPRESSION: Negative exam.  No evidence of pulmonary thromboembolism. Electronically Signed   By: Lajean Manes M.D.   On: 08/31/2021 10:13   DG Chest Portable 1 View  Result Date: 08/31/2021 CLINICAL DATA:  Altered mental status.  Dialysis patient. EXAM: PORTABLE CHEST 1 VIEW COMPARISON:  05/01/2021. FINDINGS: Cardiac silhouette is top-normal in size. No mediastinal or hilar masses. There are linear opacities at the right lung base consistent with atelectasis. Remainder of the lungs is clear. No convincing pleural effusion and no pneumothorax. Right posterolateral seventh rib fracture, which is of unclear chronicity, not evident on the most recent prior radiographs. Left subclavian to upper arm vascular stent is stable. IMPRESSION: 1. Right posterolateral seventh rib fracture of unclear chronicity, suspected to be subacute. No pneumothorax. 2. Mild linear  right lung base atelectasis accentuated by low lung volumes. No convincing acute cardiopulmonary disease. Electronically Signed   By: Lajean Manes M.D.   On: 08/31/2021 10:21    Assessment and Plan Lakeisa Heninger is a 76 y.o. Caucasian female with medical history significant for bipolar disorder, ESRD on hemodialysis MWF, GERD, hypertension, pancreatitis and vertigo, who presented to the ER with acute onset of altered mental status with confusion and generalized fatigue during her hemodialysis session for which she was sent to the ER.Upon presentation to the ER, BP was 179/77.  She was reportedly having a systolic BP of 962 earlier and was tachycardic.  Noncontrast head CT scan revealed no acute intracranial abnormalities. Brain MRI revealed mild advanced cerebral atrophy with no acute intracranial normalities.  Acute metabolic encephalopathy/altered mental status etiology unclear resolved -- could be due to high blood pressure but now stable -- CT had an MRI brain nothing acute -- patient alert and oriented times three  elevated troponin without chest pain and no acute EKG changes -- suspect could be due to elevated blood pressure -- no cardiac history per patient -- patient denies chest pain -- VQ scan negative -- [facility] cardiology consulted -- continue aspirin Plavix and statins -- Echo EF 60-65%. Mildly elevated right atrial pressure. Increased size of LA  end-stage renal disease on hemodialysis -- will continue in-house dialysis  bipolar disorder -- continue home meds  patient ambulates by herself using her cane at [place] communication : none today Consults : [facility] cardiology CODE STATUS: DNR. This was discussed with patient by me in the emergency room DVT Prophylaxis : heparin Level of care: Progressive Status is: Inpatient  Remains inpatient appropriate because: end-stage renal disease, elevated troponin  anticipate discharge 1 to 2 days back to [place] THIS PATIENT: 25 minutes.  >50% time spent on counselling and coordination of care  Note: This dictation was prepared with Dragon dictation along with smaller phrase technology. Any transcriptional errors that result from this process are unintentional.  Fritzi Mandes M.D    [facility]   CC: Primary care physician; [facility]

## 2021-08-31 NOTE — Consult Note (Signed)
[place] for heparin Indication: chest pain/ACS  Allergies  Allergen Reactions   Morphine And Related Shortness Of Breath    Pt reports chest pain and difficulty breathing.   Gabapentin     PER MAR   Indomethacin Hives   Penicillins Other (See Comments)    PER MAR   Pollen Extract Other (See Comments)    Sinus problems and HA    Patient Measurements: Height: 5\' 3"  (160 cm) Weight: 67 kg (147 lb 11.3 oz) IBW/kg (Calculated) : 52.4 Heparin Dosing Weight: 65.9 kg  Vital Signs: Temp: 99.1 F (37.3 C) (02/11 1357) BP: 177/63 (02/11 1357) Pulse Rate: 71 (02/11 1357)  Labs: Recent Labs    08/30/21 1514 08/30/21 1741 08/30/21 1949 08/31/21 0532 08/31/21 1427  HGB 10.6*  --   --  10.5*  --   HCT 31.9*  --   --  32.6*  --   PLT 184  --   --  207  --   APTT  --   --  31  --   --   LABPROT  --   --  14.7  --   --   INR  --   --  1.2  --   --   HEPARINUNFRC  --   --   --  0.27* 0.22*  CREATININE 3.31*  --   --  4.75*  --   TROPONINIHS 77* 527*  --   --   --      Estimated Creatinine Clearance: 9.5 mL/min (A) (by C-G formula based on SCr of 4.75 mg/dL (H)).   Medical History: Past Medical History:  Diagnosis Date   Anal fissure    Bipolar affective disorder ([place])    CKD (chronic kidney disease)    Dr Holley Raring [name] 4   Colon polyps    Diverticulitis    Diverticulitis    Family history of adverse reaction to anesthesia    mom - PONV   GERD (gastroesophageal reflux disease)    Headache    migraines - none over 10 yrs   Heart murmur    History of hiatal hernia    Hypertension    Pancreatitis    Valproic acid   Vertigo     Medications:  No PTA anticoagulation or antiplatelets   Assessment: 76 y.o. female with a past medical history of ESRD on HD presents to the emergency department for confusion, hypertension and tachycardia. Troponin elevated to 527. Pharmacy has been consulted for heparin dosing for ACS.    Baseline labs: Hgb 10.6, plts 184, aPTT and INR ordered  Goal of Therapy:  Heparin level 0.3-0.7 units/ml Monitor platelets by anticoagulation protocol: Yes  2/11 0532 HL 0.27   Plan:  Bolus 1,000 units Increase heparin infusion to 1,000 units/hour Recheck HL in 8 hr after rate change CBC daily while on heparin    Wynelle Cleveland, PharmD Pharmacy Resident  08/31/2021 3:03 PM

## 2021-08-31 NOTE — Consult Note (Signed)
[place] for heparin Indication: chest pain/ACS  Allergies  Allergen Reactions   Morphine And Related Shortness Of Breath    Pt reports chest pain and difficulty breathing.   Gabapentin     PER MAR   Indomethacin Hives   Penicillins Other (See Comments)    PER MAR   Pollen Extract Other (See Comments)    Sinus problems and HA    Patient Measurements: Height: 5\' 3"  (160 cm) Weight: 67 kg (147 lb 11.3 oz) IBW/kg (Calculated) : 52.4 Heparin Dosing Weight: 65.9 kg  Vital Signs: BP: 123/64 (02/11 0530) Pulse Rate: 77 (02/11 0530)  Labs: Recent Labs    08/30/21 1514 08/30/21 1741 08/30/21 1949 08/31/21 0532  HGB 10.6*  --   --  10.5*  HCT 31.9*  --   --  32.6*  PLT 184  --   --  207  APTT  --   --  31  --   LABPROT  --   --  14.7  --   INR  --   --  1.2  --   HEPARINUNFRC  --   --   --  0.27*  CREATININE 3.31*  --   --   --   TROPONINIHS 77* 527*  --   --      Estimated Creatinine Clearance: 13.7 mL/min (A) (by C-G formula based on SCr of 3.31 mg/dL (H)).   Medical History: Past Medical History:  Diagnosis Date   Anal fissure    Bipolar affective disorder ([place])    CKD (chronic kidney disease)    Dr Holley Raring [name] 4   Colon polyps    Diverticulitis    Diverticulitis    Family history of adverse reaction to anesthesia    mom - PONV   GERD (gastroesophageal reflux disease)    Headache    migraines - none over 10 yrs   Heart murmur    History of hiatal hernia    Hypertension    Pancreatitis    Valproic acid   Vertigo     Medications:  No PTA anticoagulation or antiplatelets   Assessment: 76 y.o. female with a past medical history of ESRD on HD presents to the emergency department for confusion, hypertension and tachycardia. Troponin elevated to 527. Pharmacy has been consulted for heparin dosing for ACS.   Baseline labs: Hgb 10.6, plts 184, aPTT and INR ordered  Goal of Therapy:  Heparin level 0.3-0.7  units/ml Monitor platelets by anticoagulation protocol: Yes  2/11 0532 HL 0.27   Plan:  Bolus 1000 units x 1 Increase heparin infusion to 950 units/hr Recheck HL in 8 hr after rate change CBC daily while on heparin  Renda Rolls, PharmD, [facility] 08/31/2021 6:18 AM

## 2021-08-31 NOTE — Progress Notes (Signed)
*  PRELIMINARY RESULTS* Echocardiogram 2D Echocardiogram has been performed.  Claretta Fraise 08/31/2021, 12:34 PM

## 2021-09-01 ENCOUNTER — Inpatient Hospital Stay: Payer: Medicare HMO

## 2021-09-01 DIAGNOSIS — R778 Other specified abnormalities of plasma proteins: Secondary | ICD-10-CM

## 2021-09-01 DIAGNOSIS — I160 Hypertensive urgency: Principal | ICD-10-CM

## 2021-09-01 DIAGNOSIS — R4182 Altered mental status, unspecified: Secondary | ICD-10-CM

## 2021-09-01 LAB — URINALYSIS, COMPLETE (UACMP) WITH MICROSCOPIC
RBC / HPF: >50 RBC/hpf — ABNORMAL HIGH (ref 0–5)
Specific Gravity, Urine: 1.01 (ref 1.005–1.030)
Squamous Epithelial / HPF: NONE SEEN (ref 0–5)
WBC, UA: >50 WBC/hpf — ABNORMAL HIGH (ref 0–5)

## 2021-09-01 LAB — CBC
HCT: 27.8 % — ABNORMAL LOW (ref 36.0–46.0)
Hemoglobin: 9 g/dL — ABNORMAL LOW (ref 12.0–15.0)
MCH: 31.7 pg (ref 26.0–34.0)
MCHC: 32.4 g/dL (ref 30.0–36.0)
MCV: 97.9 fL (ref 80.0–100.0)
Platelets: 187 10*3/uL (ref 150–400)
RBC: 2.84 MIL/uL — ABNORMAL LOW (ref 3.87–5.11)
RDW: 13.3 % (ref 11.5–15.5)
WBC: 11.5 10*3/uL — ABNORMAL HIGH (ref 4.0–10.5)
nRBC: 0 % (ref 0.0–0.2)

## 2021-09-01 LAB — HEPARIN LEVEL (UNFRACTIONATED)
Heparin Unfractionated: 0.34 IU/mL (ref 0.30–0.70)
Heparin Unfractionated: 0.24 IU/mL — ABNORMAL LOW (ref 0.30–0.70)

## 2021-09-01 LAB — TROPONIN I (HIGH SENSITIVITY): Troponin I (High Sensitivity): 1920 ng/L (ref <18)

## 2021-09-01 MED ORDER — HEPARIN BOLUS VIA INFUSION
1000.0000 [IU] | Freq: Once | INTRAVENOUS | Status: AC
  Administered 2021-09-01: 1000 [IU] via INTRAVENOUS
  Filled 2021-09-01: qty 1000

## 2021-09-01 MED ORDER — HEPARIN (PORCINE) 25000 UT/250ML-% IV SOLN
1200.0000 [IU]/h | INTRAVENOUS | Status: DC
  Administered 2021-09-01: 1200 [IU]/h via INTRAVENOUS
  Administered 2021-09-01: 1100 [IU]/h via INTRAVENOUS
  Filled 2021-09-01: qty 250

## 2021-09-01 MED ORDER — CHLORHEXIDINE GLUCONATE CLOTH 2 % EX PADS
6.0000 | MEDICATED_PAD | Freq: Every day | CUTANEOUS | Status: DC
  Administered 2021-09-02: 6 via TOPICAL

## 2021-09-01 NOTE — Progress Notes (Signed)
[facility]  ROUNDING NOTE   Subjective:   Joyce [name] is a 76 year old female with a past medical history including hypertension, pancreatitis, GERD, vertigo, and end-stage renal disease on hemodialysis.  Patient presents to the emergency department with complaints of confusion and fatigue while receiving outpatient dialysis treatment.  Patient has been admitted for NSTEMI (non-ST elevated myocardial infarction) ([place]) [I21.4] Altered mental status, unspecified altered mental status type [R41.82]  Patient is known to our clinic and receives outpatient treatments at [facility] on a MWF schedule, supervised by Dr. Holley Raring.    Patient sitting up in bed Nurse at bedside with breakfast tray and morning meds Both patient and nurse report blood in urine, known history Patient reports lower back pain   Objective:  Vital signs in last 24 hours:  Temp:  [97.6 F (36.4 C)-99.1 F (37.3 C)] 97.6 F (36.4 C) (02/12 0825) Pulse Rate:  [71-89] 74 (02/12 0825) Resp:  [16-20] 16 (02/12 0825) BP: (116-177)/(47-63) 136/59 (02/12 0825) SpO2:  [93 %-98 %] 95 % (02/12 0825)  Weight change:  Filed Weights   08/30/21 1509  Weight: 67 kg    Intake/Output: I/O last 3 completed shifts: In: 934.2 [I.V.:934.2] Out: 0    Intake/Output this shift:  Total I/O In: 253.3 [P.O.:240; I.V.:13.3] Out: -   Physical Exam: General: NAD, sitting up in bed  Head: Normocephalic, atraumatic. Moist oral mucosal membranes  Eyes: Anicteric  Lungs:  Clear to auscultation, normal effort  Heart: Regular rate and rhythm  Abdomen:  Soft, nontender  Extremities: Trace peripheral edema.  Neurologic: Nonfocal, moving all four extremities  Skin: No lesions  Access: LUE aVF    Basic Metabolic Panel: Recent Labs  Lab 08/30/21 1514 08/31/21 0532  NA 141 139  K 3.3* 3.8  CL 102 102  CO2 24 27  GLUCOSE 150* 109*  BUN 14 24*  CREATININE 3.31* 4.75*  CALCIUM 8.7* 8.9     Liver Function  Tests: Recent Labs  Lab 08/30/21 1514  AST 22  ALT 12  ALKPHOS 71  BILITOT 0.5  PROT 6.5  ALBUMIN 3.4*    No results for input(s): LIPASE, AMYLASE in the last 168 hours. No results for input(s): AMMONIA in the last 168 hours.  CBC: Recent Labs  Lab 08/30/21 1514 08/31/21 0532 09/01/21 0502  WBC 14.7* 16.0* 11.5*  HGB 10.6* 10.5* 9.0*  HCT 31.9* 32.6* 27.8*  MCV 98.5 99.1 97.9  PLT 184 207 187     Cardiac Enzymes: No results for input(s): CKTOTAL, CKMB, CKMBINDEX, TROPONINI in the last 168 hours.  BNP: Invalid input(s): POCBNP  CBG: No results for input(s): GLUCAP in the last 168 hours.  Microbiology: Results for orders placed or performed during the hospital encounter of 08/30/21  Resp Panel by RT-PCR (Flu A&B, Covid) Nasopharyngeal Swab     Status: None   Collection Time: 08/30/21  3:14 PM   Specimen: Nasopharyngeal Swab; Nasopharyngeal(NP) swabs in vial transport medium  Result Value Ref Range Status   SARS Coronavirus 2 by RT PCR NEGATIVE NEGATIVE Final    Comment: (NOTE) SARS-CoV-2 target nucleic acids are NOT DETECTED.  The SARS-CoV-2 RNA is generally detectable in upper respiratory specimens during the acute phase of infection. The lowest concentration of SARS-CoV-2 viral copies this assay can detect is 138 copies/mL. A negative result does not preclude SARS-Cov-2 infection and should not be used as the sole basis for treatment or other patient management decisions. A negative result may occur with  improper  specimen collection/handling, submission of specimen other than nasopharyngeal swab, presence of viral mutation(s) within the areas targeted by this assay, and inadequate number of viral copies(<138 copies/mL). A negative result must be combined with clinical observations, patient history, and epidemiological information. The expected result is Negative.  Fact Sheet for Patients:  EntrepreneurPulse.com.au  Fact Sheet for  Healthcare Providers:  IncredibleEmployment.be  This test is no t yet approved or cleared by the [place] FDA and  has been authorized for detection and/or diagnosis of SARS-CoV-2 by FDA under an Emergency Use Authorization (EUA). This EUA will remain  in effect (meaning this test can be used) for the duration of the COVID-19 declaration under Section 564(b)(1) of the Act, 21 U.S.C.section 360bbb-3(b)(1), unless the authorization is terminated  or revoked sooner.       Influenza A by PCR NEGATIVE NEGATIVE Final   Influenza B by PCR NEGATIVE NEGATIVE Final    Comment: (NOTE) The Xpert Xpress SARS-CoV-2/FLU/RSV plus assay is intended as an aid in the diagnosis of influenza from Nasopharyngeal swab specimens and should not be used as a sole basis for treatment. Nasal washings and aspirates are unacceptable for Xpert Xpress SARS-CoV-2/FLU/RSV testing.  Fact Sheet for Patients: EntrepreneurPulse.com.au  Fact Sheet for Healthcare Providers: IncredibleEmployment.be  This test is not yet approved or cleared by the [place] FDA and has been authorized for detection and/or diagnosis of SARS-CoV-2 by FDA under an Emergency Use Authorization (EUA). This EUA will remain in effect (meaning this test can be used) for the duration of the COVID-19 declaration under Section 564(b)(1) of the Act, 21 U.S.C. section 360bbb-3(b)(1), unless the authorization is terminated or revoked.  Performed at [facility], [address] [place], [place] [address]     Coagulation Studies: Recent Labs    08/30/21 1949  LABPROT 14.7  INR 1.2     Urinalysis: No results for input(s): COLORURINE, LABSPEC, PHURINE, GLUCOSEU, HGBUR, BILIRUBINUR, KETONESUR, PROTEINUR, UROBILINOGEN, NITRITE, LEUKOCYTESUR in the last 72 hours.  Invalid input(s): APPERANCEUR    Imaging: CT HEAD WO CONTRAST (5MM)  Result Date: 08/30/2021 CLINICAL  DATA:  Mental status change, unknown cause.  Weakness. EXAM: CT HEAD WITHOUT CONTRAST TECHNIQUE: Contiguous axial images were obtained from the base of the skull through the vertex without intravenous contrast. RADIATION DOSE REDUCTION: This exam was performed according to the departmental dose-optimization program which includes automated exposure control, adjustment of the mA and/or kV according to patient size and/or use of iterative reconstruction technique. COMPARISON:  CT head 12/14/2020. FINDINGS: Brain: There is no evidence of acute intracranial hemorrhage, mass lesion, brain edema or extra-axial fluid collection. Stable atrophy with prominence of the subarachnoid spaces. No ventriculomegaly. Stable mild chronic small vessel ischemic changes in the periventricular white matter. There is no CT evidence of acute cortical infarction. Vascular:  No hyperdense vessel identified. Skull: Negative for fracture or focal lesion. Sinuses/Orbits: The visualized paranasal sinuses and mastoid air cells are clear. No orbital abnormalities are seen. Other: None. IMPRESSION: Stable noncontrast head CT.  No acute intracranial findings. Electronically Signed   By: Richardean Sale M.D.   On: 08/30/2021 15:44   MR BRAIN WO CONTRAST  Result Date: 08/30/2021 CLINICAL DATA:  Initial evaluation for acute mental status change. EXAM: MRI HEAD WITHOUT CONTRAST TECHNIQUE: Multiplanar, multiecho pulse sequences of the brain and surrounding structures were obtained without intravenous contrast. COMPARISON:  Prior head CT from earlier the same day. FINDINGS: Brain: Mildly advanced cerebral atrophy. Few scattered patchy foci of T2/FLAIR hyperintensity noted  involving the supratentorial cerebral white matter, nonspecific, but overall mild for age, and felt to be within normal limits. No abnormal foci of restricted diffusion to suggest acute or subacute ischemia. Gray-white matter differentiation maintained. No areas of chronic cortical  infarction. No acute or chronic intracranial hemorrhage. No mass lesion, midline shift or mass effect no hydrocephalus or extra-axial fluid collection. Pituitary gland suprasellar region normal. Vascular: Major intracranial vascular flow voids are maintained. Skull and upper cervical spine: Craniocervical junction within normal limits. Bone marrow signal intensity normal. No scalp soft tissue abnormality. Sinuses/Orbits: Prior bilateral ocular lens replacement. Mild scattered mucosal thickening noted throughout the paranasal sinuses. No significant mastoid effusion. Inner ear structures grossly normal. Other: None. IMPRESSION: 1. No acute intracranial abnormality. 2. Mildly advanced cerebral atrophy. Electronically Signed   By: Jeannine Boga M.D.   On: 08/30/2021 21:13   NM Pulmonary Perfusion  Result Date: 08/31/2021 CLINICAL DATA:  Altered mental status. Dialysis patient. Unable to obtain additional information. EXAM: NUCLEAR MEDICINE PERFUSION LUNG SCAN TECHNIQUE: Perfusion images were obtained in multiple projections after intravenous injection of radiopharmaceutical. Ventilation scans intentionally deferred if perfusion scan and chest x-ray adequate for interpretation during COVID 19 epidemic. RADIOPHARMACEUTICALS:  4.2 mCi Tc-55m MAA IV COMPARISON:  Current chest radiograph FINDINGS: Normal perfusion. There are no segmental areas decreased perfusion to suggest pulmonary thromboembolism. IMPRESSION: Negative exam.  No evidence of pulmonary thromboembolism. Electronically Signed   By: Lajean Manes M.D.   On: 08/31/2021 10:13   DG Chest Portable 1 View  Result Date: 08/31/2021 CLINICAL DATA:  Altered mental status.  Dialysis patient. EXAM: PORTABLE CHEST 1 VIEW COMPARISON:  05/01/2021. FINDINGS: Cardiac silhouette is top-normal in size. No mediastinal or hilar masses. There are linear opacities at the right lung base consistent with atelectasis. Remainder of the lungs is clear. No convincing  pleural effusion and no pneumothorax. Right posterolateral seventh rib fracture, which is of unclear chronicity, not evident on the most recent prior radiographs. Left subclavian to upper arm vascular stent is stable. IMPRESSION: 1. Right posterolateral seventh rib fracture of unclear chronicity, suspected to be subacute. No pneumothorax. 2. Mild linear right lung base atelectasis accentuated by low lung volumes. No convincing acute cardiopulmonary disease. Electronically Signed   By: Lajean Manes M.D.   On: 08/31/2021 10:21   ECHOCARDIOGRAM COMPLETE  Result Date: 08/31/2021    ECHOCARDIOGRAM REPORT   Patient Name:   Joyce [name] [facility] Date of Exam: 08/31/2021 Medical Rec #:  [id number]         Height:       63.0 in Accession #:    [id number]        Weight:       147.7 lb Date of Birth:  02-09-1946          BSA:          1.700 m Patient Age:    34 years          BP:           95/51 mmHg Patient Gender: F                 HR:           75 bpm. Exam Location:  [facility] Procedure: 2D Echo and Strain Analysis Indications:     NSTEMI I21.4  History:         Patient has prior history of Echocardiogram examinations, most  recent 11/15/2020.  Sonographer:     Kathlen Brunswick RDCS Referring Phys:  [id number] [place] Diagnosing Phys: Skeet Latch MD  Sonographer Comments: Global longitudinal strain was attempted. IMPRESSIONS  1. Left ventricular ejection fraction, by estimation, is 60 to 65%. The left ventricle has normal function. The left ventricle has no regional wall motion abnormalities. There is mild concentric left ventricular hypertrophy. Left ventricular diastolic parameters are indeterminate. The average left ventricular global longitudinal strain is -16.8 %. The global longitudinal strain is normal.  2. Right ventricular systolic function is normal. The right ventricular size is normal. There is mildly elevated pulmonary artery systolic pressure.  3. Left atrial size was severely dilated.  4. The  mitral valve is normal in structure. Mild mitral valve regurgitation. No evidence of mitral stenosis.  5. Tricuspid valve regurgitation is mild to moderate.  6. The aortic valve is tricuspid. Aortic valve regurgitation is not visualized. No aortic stenosis is present.  7. The inferior vena cava is normal in size with greater than 50% respiratory variability, suggesting right atrial pressure of 3 mmHg. FINDINGS  Left Ventricle: Left ventricular ejection fraction, by estimation, is 60 to 65%. The left ventricle has normal function. The left ventricle has no regional wall motion abnormalities. The average left ventricular global longitudinal strain is -16.8 %. The global longitudinal strain is normal. The left ventricular internal cavity size was normal in size. There is mild concentric left ventricular hypertrophy. Left ventricular diastolic parameters are indeterminate. Indeterminate filling pressures. Right Ventricle: The right ventricular size is normal. No increase in right ventricular wall thickness. Right ventricular systolic function is normal. There is mildly elevated pulmonary artery systolic pressure. The tricuspid regurgitant velocity is 3.20  m/s, and with an assumed right atrial pressure of 3 mmHg, the estimated right ventricular systolic pressure is 99.3 mmHg. Left Atrium: Left atrial size was severely dilated. Right Atrium: Right atrial size was normal in size. Pericardium: There is no evidence of pericardial effusion. Mitral Valve: The mitral valve is normal in structure. Mild mitral valve regurgitation. No evidence of mitral valve stenosis. Tricuspid Valve: The tricuspid valve is normal in structure. Tricuspid valve regurgitation is mild to moderate. No evidence of tricuspid stenosis. Aortic Valve: The aortic valve is tricuspid. Aortic valve regurgitation is not visualized. No aortic stenosis is present. Aortic valve peak gradient measures 13.1 mmHg. Pulmonic Valve: The pulmonic valve was normal in  structure. Pulmonic valve regurgitation is trivial. No evidence of pulmonic stenosis. Aorta: The aortic root is normal in size and structure. Venous: The inferior vena cava is normal in size with greater than 50% respiratory variability, suggesting right atrial pressure of 3 mmHg. IAS/Shunts: No atrial level shunt detected by color flow Doppler.  LEFT VENTRICLE PLAX 2D LVIDd:         4.67 cm     Diastology LVIDs:         2.95 cm     LV e' medial:    8.27 cm/s LV PW:         1.14 cm     LV E/e' medial:  13.9 LV IVS:        1.15 cm     LV e' lateral:   9.90 cm/s LVOT diam:     1.80 cm     LV E/e' lateral: 11.6 LV SV:         81 LV SV Index:   48          2D Longitudinal Strain LVOT Area:  2.54 cm    2D Strain GLS (A2C):   -17.5 %                            2D Strain GLS (A3C):   -17.2 %                            2D Strain GLS (A4C):   -15.6 % LV Volumes (MOD)           2D Strain GLS Avg:     -16.8 % LV vol d, MOD A2C: 39.4 ml LV vol d, MOD A4C: 63.6 ml LV vol s, MOD A2C: 11.0 ml LV vol s, MOD A4C: 17.9 ml LV SV MOD A2C:     28.4 ml LV SV MOD A4C:     63.6 ml LV SV MOD BP:      36.0 ml RIGHT VENTRICLE RV Basal diam:  2.80 cm RV S prime:     14.90 cm/s TAPSE (M-mode): 2.2 cm LEFT ATRIUM             Index        RIGHT ATRIUM           Index LA diam:        4.40 cm 2.59 cm/m   RA Area:     13.70 cm LA Vol (A2C):   90.3 ml 53.12 ml/m  RA Volume:   34.30 ml  20.18 ml/m LA Vol (A4C):   98.0 ml 57.65 ml/m LA Biplane Vol: 94.3 ml 55.47 ml/m  AORTIC VALVE                 PULMONIC VALVE AV Area (Vmax): 2.35 cm     PV Vmax:       1.25 m/s AV Vmax:        181.00 cm/s  PV Peak grad:  6.2 mmHg AV Peak Grad:   13.1 mmHg LVOT Vmax:      167.00 cm/s LVOT Vmean:     97.400 cm/s LVOT VTI:       0.318 m  AORTA Ao Root diam: 3.00 cm Ao Asc diam:  3.00 cm MITRAL VALVE                TRICUSPID VALVE MV Area (PHT): 2.78 cm     TV Peak grad:   39.5 mmHg MV Decel Time: 273 msec     TV Vmax:        3.14 m/s MV E velocity: 115.00  cm/s  TR Peak grad:   41.0 mmHg MV A velocity: 95.10 cm/s   TR Vmax:        320.00 cm/s MV E/A ratio:  1.21                             SHUNTS                             Systemic VTI:  0.32 m                             Systemic Diam: 1.80 cm Skeet Latch MD Electronically signed by Skeet Latch MD Signature Date/Time: 08/31/2021/2:53:39 PM    Final      Medications:    heparin 1,100 Units/hr (09/01/21  0710)    aspirin EC  81 mg Oral Daily   atorvastatin  80 mg Oral Daily   brimonidine  1 drop Both Eyes BID AC & HS   calcium acetate  667 mg Oral TID WC   carbamazepine  100 mg Oral BID   clopidogrel  75 mg Oral Daily   dicyclomine  20 mg Oral TID PC   famotidine  20 mg Oral QHS   fluticasone  1 spray Each Nare Daily   [START ON 09/02/2021] haloperidol  2.5 mg Oral Q M,W,F   latanoprost  1 drop Both Eyes QHS   loratadine  10 mg Oral Daily   metoprolol tartrate  25 mg Oral BID   montelukast  10 mg Oral Daily   pantoprazole  40 mg Oral Q breakfast   polyvinyl alcohol  3 drop Right Eye QID   rOPINIRole  2 mg Oral QHS   timolol  1 drop Both Eyes BID AC & HS   vitamin B-12  1,000 mcg Oral Q breakfast   ziprasidone  20 mg Oral QHS   acetaminophen, ALPRAZolam, diclofenac Sodium, magnesium hydroxide, nitroGLYCERIN, ondansetron (ZOFRAN) IV, traZODone  Assessment/ Plan:  Joyce [name] is a 76 y.o.  female a past medical history including hypertension, pancreatitis, GERD, vertigo, and end-stage renal disease on hemodialysis.  Patient presents to the emergency department with complaints of confusion and fatigue while receiving outpatient dialysis treatment.  Patient has been admitted for NSTEMI (non-ST elevated myocardial infarction) ([place]) [I21.4] Altered mental status, unspecified altered mental status type [R41.82]  [facility]/MWF/LUE AVF/65.5 kg  End-stage renal disease on hemodialysis.  Will maintain outpatient schedule during admission.  Patient with known  polycystic kidney disease which can result in intermittent hematuria. Will order renal ultrasound to evaluate kidneys and described pain. Will also order urine culture, if positive, will prescribe accordingly.  Next treatment scheduled for Monday  2. Anemia of chronic kidney disease Normocytic Lab Results  Component Value Date   HGB 9.0 (L) 09/01/2021  [place] received outpatient Hemoglobin at goal  3. Secondary Hyperparathyroidism: with outpatient labs: phosphorus 3.9, calcium 9.7 on 08/31/21.   Lab Results  Component Value Date   CALCIUM 8.9 08/31/2021   CAION 1.16 08/09/2021   PHOS 4.1 05/23/2019  Calcitriol prescribed outpatient Calcium and phosphorus remain within acceptable target. Continue calcium acetate with meals.  4.  NSTEMI with elevated troponin. Heparin drip stopped due to bleeding. Cardiology following. 2D ECHO pending.  VQ scan negative, continuing aspirin, plavix and statins    LOS: 2   2/12/202310:49 AM

## 2021-09-01 NOTE — Consult Note (Signed)
Cardiology Consultation:   Patient ID: Joyce [name] MRN: [id number]; DOB: Jan 25, 1946  Admit date: 08/30/2021 Date of Consult: 09/01/2021  PCP:  Orvis Brill, [place] Providers Cardiologist:  Kate Sable, MD        Patient Profile:   Joyce [name] is a 76 y.o. female with a hx of ESRD on HD, hypertension, bipolar disorder, pancreatitis, and vertigo who is being seen 09/01/2021 for the evaluation of labile hypertension and altered mental status during dialysis at the request of Dr. Posey Pronto.Marland Kitchen  History of Present Illness:   Joyce [name] was in her dialysis session when she developed acute onset of altered mental status with confusion and weakness during her dialysis session.  She had no chest pain or shortness of breath.  In the ED her blood pressure was 179/77.  During dialysis her blood pressure was as high as 759 systolic.  Since arriving in the emergency department she is had sinus rhythm and sinus tachycardia.  Labs were notable for mild hypokalemia at 3.3.  High-sensitivity troponin was elevated to 77-->527-->1920.  She continues to complain of chest discomfort now.  She denies shortness of breath and is lying in bed comfortably.  She is unaware of what happened when in dialysis.  She denies any chest pain prior to her HD session.  She had an echo 08/31/2021 that revealed LVEF 60 to 65% with mild LVH and indeterminate diastolic function.  Right ventricular size and function were normal and there was no major valvular abnormality.  Cardiology was consulted due to her elevated troponin.  Joyce [name] is a previously seen by Dr. Garen Lah.  She was previously managed for hemodialysis induced hypotension.  She has previously had beta-blockers discontinued due to bradycardia and dizziness.  She was started on midodrine to be taken 15 to 30 minutes prior to dialysis.  She wore an ambulatory monitor 10/2021 that was negative for arrhythmias concerning for syncope though she  did have short runs of SVT.  When she last saw Dr. Garen Lah on 11/2020, her blood pressure was 166/60.  Given her issues with syncope no additions were made and she was to continue midodrine on HD days.  Past Medical History:  Diagnosis Date   Anal fissure    Bipolar affective disorder ([place])    CKD (chronic kidney disease)    Dr Holley Raring [name] 4   Colon polyps    Diverticulitis    Diverticulitis    Family history of adverse reaction to anesthesia    mom - PONV   GERD (gastroesophageal reflux disease)    Headache    migraines - none over 10 yrs   Heart murmur    History of hiatal hernia    Hypertension    Pancreatitis    Valproic acid   Vertigo     Past Surgical History:  Procedure Laterality Date   A/V FISTULAGRAM Left 04/14/2017   Procedure: A/V Fistulagram;  Surgeon: Katha Cabal, MD;  Location: [place] [facility];  Service: Cardiovascular;  Laterality: Left;   A/V FISTULAGRAM Left 06/09/2017   Procedure: A/V FISTULAGRAM;  Surgeon: Katha Cabal, MD;  Location: [place] [facility];  Service: Cardiovascular;  Laterality: Left;   A/V FISTULAGRAM Left 11/02/2017   Procedure: A/V FISTULAGRAM;  Surgeon: Algernon Huxley, MD;  Location: [place] [facility];  Service: Cardiovascular;  Laterality: Left;   A/V FISTULAGRAM Left 12/22/2018   Procedure: A/V FISTULAGRAM;  Surgeon: Katha Cabal, MD;  Location: [facility]  [facility];  Service: Cardiovascular;  Laterality: Left;   A/V FISTULAGRAM Left 06/12/2020   Procedure: A/V FISTULAGRAM;  Surgeon: Katha Cabal, MD;  Location: [place] [facility];  Service: Cardiovascular;  Laterality: Left;   A/V FISTULAGRAM Left 01/22/2021   Procedure: A/V FISTULAGRAM;  Surgeon: Katha Cabal, MD;  Location: [place] [facility];  Service: Cardiovascular;  Laterality: Left;   A/V SHUNT INTERVENTION N/A 11/02/2017   Procedure: A/V SHUNT INTERVENTION;  Surgeon: Algernon Huxley, MD;  Location: [place] [facility];  Service:  Cardiovascular;  Laterality: N/A;   ABDOMINAL HYSTERECTOMY  1990 ?   AV FISTULA PLACEMENT  4/30   BREAST EXCISIONAL BIOPSY Left 1994   neg surgical bx   CHOLECYSTECTOMY  2003   COLONOSCOPY  2014   Dr. Jamal Collin   COLONOSCOPY WITH PROPOFOL N/A 09/24/2015   Procedure: COLONOSCOPY WITH random colon byopies.;  Surgeon: Lucilla Lame, MD;  Location: [place];  Service: Endoscopy;  Laterality: N/A;   ESOPHAGOGASTRODUODENOSCOPY (EGD) WITH PROPOFOL N/A 09/24/2015   Procedure: ESOPHAGOGASTRODUODENOSCOPY (EGD) ;  Surgeon: Lucilla Lame, MD;  Location: [place];  Service: Endoscopy;  Laterality: N/A;   ESOPHAGOGASTRODUODENOSCOPY (EGD) WITH PROPOFOL N/A 12/11/2020   Procedure: ESOPHAGOGASTRODUODENOSCOPY (EGD) WITH PROPOFOL;  Surgeon: Lucilla Lame, MD;  Location: [facility];  Service: Endoscopy;  Laterality: N/A;   EYE SURGERY     PERIPHERAL VASCULAR CATHETERIZATION N/A 05/29/2015   Procedure: A/V Shuntogram/Fistulagram;  Surgeon: Katha Cabal, MD;  Location: [place] [facility];  Service: Cardiovascular;  Laterality: N/A;   PERIPHERAL VASCULAR CATHETERIZATION N/A 05/29/2015   Procedure: A/V Shunt Intervention;  Surgeon: Katha Cabal, MD;  Location: [place] [facility];  Service: Cardiovascular;  Laterality: N/A;   POLYPECTOMY  09/24/2015   Procedure: POLYPECTOMY INTESTINAL;  Surgeon: Lucilla Lame, MD;  Location: [place];  Service: Endoscopy;;  cecal polyp ascending polyp   REVISON OF ARTERIOVENOUS FISTULA Left 08/09/2021   Procedure: REVISON OF ARTERIOVENOUS FISTULA ( BRACHIAL CEPHALIC);  Surgeon: Katha Cabal, MD;  Location: [facility];  Service: Vascular;  Laterality: Left;   UPPER EXTREMITY ANGIOGRAM Left 08/09/2021   Procedure: UPPER EXTREMITY ANGIOGRAM ( POSSIBLE INTERVENTION);  Surgeon: Katha Cabal, MD;  Location: [facility];  Service: Vascular;  Laterality: Left;   UPPER EXTREMITY ANGIOGRAPHY Left 07/02/2021   Procedure: UPPER EXTREMITY ANGIOGRAPHY;   Surgeon: Katha Cabal, MD;  Location: [place] [facility];  Service: Cardiovascular;  Laterality: Left;     Home Medications:  Prior to Admission medications   Medication Sig Start Date End Date Taking? Authorizing Provider  brimonidine (ALPHAGAN) 0.2 % ophthalmic solution Place 1 drop into both eyes 2 (two) times daily. Patient taking differently: Place 1 drop into both eyes 2 (two) times daily. (0600 & 2000) 10/22/17  Yes Clapacs, [name], MD  calcium acetate (PHOSLO) 667 MG capsule Take 667 mg by mouth 3 (three) times daily with meals. 01/04/19  Yes [provider]  carbamazepine (TEGRETOL) 100 MG chewable tablet Chew 100 mg by mouth 2 (two) times daily. (0600 & 2100)   Yes [provider]  Carboxymethylcellulose Sod PF 1 % GEL Place 3 drops into the right eye 4 (four) times daily.   Yes [provider]  Dexlansoprazole 30 MG capsule Take 30 mg by mouth daily. (0630)   Yes [provider]  dicyclomine (BENTYL) 20 MG tablet Take 20 mg by mouth 3 (three) times daily after meals.   Yes [provider]  famotidine (PEPCID)  40 MG tablet Take 40 mg by mouth at bedtime. (2000) 07/25/20  Yes [provider]  fluticasone (FLONASE) 50 MCG/ACT nasal spray Place 1 spray into both nostrils in the morning. (0700)   Yes [provider]  haloperidol (HALDOL) 5 MG tablet TAKE 1/2 TABLET THREE TIMES WEEKLY ON MONDAY, WEDNESDAY AND FRIDAY  MORNING   Yes [provider]  lidocaine-prilocaine (EMLA) cream APPLY TO ACCESS AREA [place] / WEDNESDAY / FRIDAY   Yes [provider]  loratadine (CLARITIN) 10 MG tablet Take 10 mg by mouth daily.   Yes [provider]  montelukast (SINGULAIR) 10 MG tablet Take 10 mg by mouth daily.   Yes [provider]  Netarsudil-Latanoprost (ROCKLATAN) 0.02-0.005 % SOLN INSTILL 1 DROP INTO EACH EYE AT BEDTIME   Yes [provider]  rOPINIRole (REQUIP) 2  MG tablet Take 2 mg by mouth at bedtime. 04/25/21  Yes [provider]  timolol (TIMOPTIC) 0.5 % ophthalmic solution Place 1 drop into both eyes 2 (two) times daily.   Yes [provider]  traZODone (DESYREL) 50 MG tablet Take 25 mg by mouth at bedtime.   Yes [provider]  vitamin B-12 (CYANOCOBALAMIN) 1000 MCG tablet Take 1,000 mcg by mouth in the morning. (0600)   Yes [provider]  ziprasidone (GEODON) 20 MG capsule Take 20 mg by mouth at bedtime. (2100)   Yes [provider]  acetaminophen (TYLENOL) 500 MG tablet Take 1,000 mg by mouth daily as needed for moderate pain.    [provider]  diclofenac Sodium (VOLTAREN) 1 % GEL Apply 2 g topically 3 (three) times daily as needed (pain). 12/20/20   [provider]  ondansetron (ZOFRAN) 4 MG tablet Take 4 mg by mouth every 6 (six) hours as needed for nausea or vomiting.    [provider]  traMADol (ULTRAM) 50 MG tablet Take 1 tablet (50 mg total) by mouth every 6 (six) hours as needed. 08/09/21 08/09/22  Schnier, Dolores Lory, MD    Inpatient Medications: Scheduled Meds:  aspirin EC  81 mg Oral Daily   atorvastatin  80 mg Oral Daily   brimonidine  1 drop Both Eyes BID AC & HS   calcium acetate  667 mg Oral TID WC   carbamazepine  100 mg Oral BID   [START ON 09/02/2021] Chlorhexidine Gluconate Cloth  6 each Topical Q0600   clopidogrel  75 mg Oral Daily   dicyclomine  20 mg Oral TID PC   famotidine  20 mg Oral QHS   fluticasone  1 spray Each Nare Daily   [START ON 09/02/2021] haloperidol  2.5 mg Oral Q M,W,F   latanoprost  1 drop Both Eyes QHS   loratadine  10 mg Oral Daily   metoprolol tartrate  25 mg Oral BID   montelukast  10 mg Oral Daily   pantoprazole  40 mg Oral Q breakfast   polyvinyl alcohol  3 drop Right Eye QID   rOPINIRole  2 mg Oral QHS   timolol  1 drop Both Eyes BID AC & HS   vitamin B-12  1,000 mcg Oral Q breakfast   ziprasidone  20 mg Oral QHS    Continuous Infusions:  heparin 1,200 Units/hr (09/01/21 1615)   PRN Meds: acetaminophen, ALPRAZolam, diclofenac Sodium, magnesium hydroxide, nitroGLYCERIN, ondansetron (ZOFRAN) IV, traZODone  Allergies:    Allergies  Allergen Reactions   Morphine And Related Shortness Of Breath    Pt reports  chest pain and difficulty breathing.   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 ([place])   Walks independently.   Social Determinants of Health   Financial Resource Strain: Not on file  Food Insecurity: Not on file  Transportation Needs: Not on file  Physical Activity: Not on file  Stress: Not on file  Social Connections: Not on file  Intimate Partner Violence: Not on file    Family History:    Family History  Problem Relation Age of Onset   Stroke Father    Hypertension Father    Breast cancer Other    Colon cancer Neg Hx    Liver disease Neg Hx    Mental illness Neg Hx      ROS:  Please see the history of present illness.  All other ROS reviewed and negative.     Physical Exam/Data:   Vitals:   08/31/21 2000 09/01/21 0000 09/01/21 0400 09/01/21 0825  BP: (!) 121/49 (!) 116/47 (!) 135/53 (!) 136/59   Pulse: 88 82 89 74  Resp: 18 20 20 16   Temp: 98.6 F (37 C) 98.9 F (37.2 C) 98.9 F (37.2 C) 97.6 F (36.4 C)  TempSrc: Oral Axillary  Oral  SpO2: 93% 94% 94% 95%  Weight:      Height:        Intake/Output Summary (Last 24 hours) at 09/01/2021 1713 Last data filed at 09/01/2021 1400 Gross per 24 hour  Intake 630.57 ml  Output 250 ml  Net 380.57 ml   Last 3 Weights 08/30/2021 08/29/2021 08/09/2021  Weight (lbs) 147 lb 11.3 oz 148 lb 147 lb 9.6 oz  Weight (kg) 67 kg 67.132 kg 66.951 kg  Some encounter information is confidential and restricted. Go to Review Flowsheets activity to see all data.     VS:  BP (!) 136/59 (BP Location: Right Arm)    Pulse 74    Temp 97.6 F (36.4 C) (Oral)    Resp 16    Ht 5\' 3"  (1.6 m)    Wt 67 kg    SpO2 95%    BMI 26.17 kg/m  , BMI Body mass index is 26.17 kg/m. GENERAL:  Chronically ill-appearing.  No acute distress.  HEENT: Pupils equal round and reactive, fundi not visualized, oral mucosa unremarkable NECK:  No jugular venous distention, waveform within normal limits, carotid upstroke brisk and symmetric, no bruits, no thyromegaly LUNGS:  Clear to auscultation bilaterally HEART:  RRR.  PMI not displaced or sustained,S1 and S2 within normal limits, no  S3, no S4, no clicks, no rubs, II/VI systolic murmur ABD:  Flat, positive bowel sounds normal in frequency in pitch, no bruits, no rebound, no guarding, no midline pulsatile mass, no hepatomegaly, no splenomegaly EXT:  2 plus pulses throughout, no edema, no cyanosis no clubbing SKIN:  No rashes no nodules NEURO:  Cranial nerves II through XII grossly intact, motor grossly intact throughout PSYCH:  Cognitively intact, oriented to person place and time   EKG:  The EKG was personally reviewed and demonstrates:  08/30/2021: Sinus tachycardia.  Rate 109 bpm.  LAFB.  LVH. Telemetry:  Telemetry was personally reviewed and demonstrates:  sinus rhythm  Relevant CV Studies:  Echo 08/31/21:  1. Left  ventricular ejection fraction, by estimation, is 60 to 65%. The  left ventricle has normal function. The left ventricle has no regional  wall motion abnormalities. There is mild concentric left ventricular  hypertrophy. Left ventricular diastolic  parameters are indeterminate. The average left ventricular global  longitudinal strain is -16.8 %. The global longitudinal strain is normal.   2. Right ventricular systolic function is normal. The right ventricular  size is normal. There is mildly elevated pulmonary artery systolic  pressure.   3. Left atrial size was severely dilated.   4. The mitral valve is normal in structure. Mild mitral valve  regurgitation. No evidence of mitral stenosis.   5. Tricuspid valve regurgitation is mild to moderate.   6. The aortic valve is tricuspid. Aortic valve regurgitation is not  visualized. No aortic stenosis is present.   7. The inferior vena cava is normal in size with greater than 50%  respiratory variability, suggesting right atrial pressure of 3 mmHg.   Laboratory Data:  High Sensitivity Troponin:   Recent Labs  Lab 08/30/21 1514 08/30/21 1741 09/01/21 1258  TROPONINIHS 77* 527* 1,920*     Chemistry Recent Labs  Lab 08/30/21 1514 08/31/21 0532  NA 141 139  K 3.3* 3.8  CL 102 102  CO2 24 27  GLUCOSE 150* 109*  BUN 14 24*  CREATININE 3.31* 4.75*  CALCIUM 8.7* 8.9  GFRNONAA 14* 9*  ANIONGAP 15 10    Recent Labs  Lab 08/30/21 1514  PROT 6.5  ALBUMIN 3.4*  AST 22  ALT 12  ALKPHOS 71  BILITOT 0.5   Lipids  Recent Labs  Lab 08/31/21 0532  CHOL 176  TRIG 46  HDL 65  LDLCALC 102*  CHOLHDL 2.7    Hematology Recent Labs  Lab 08/30/21 1514 08/31/21 0532 09/01/21 0502  WBC 14.7* 16.0* 11.5*  RBC 3.24* 3.29* 2.84*  HGB 10.6* 10.5* 9.0*  HCT 31.9* 32.6* 27.8*  MCV 98.5 99.1 97.9  MCH 32.7 31.9 31.7  MCHC 33.2 32.2 32.4  RDW 13.3 13.6 13.3  PLT 184 207 187   Thyroid No results for input(s): TSH, FREET4 in the last  168 hours.  BNPNo results for input(s): BNP, PROBNP in the last 168 hours.  DDimer No results for input(s): DDIMER in the last 168 hours.   Radiology/Studies:  CT HEAD WO CONTRAST (5MM)  Result Date: 08/30/2021 CLINICAL DATA:  Mental status change, unknown cause.  Weakness. EXAM: CT HEAD WITHOUT CONTRAST TECHNIQUE: Contiguous axial images were obtained from the base of the skull through the vertex without intravenous contrast. RADIATION DOSE REDUCTION: This exam was performed according to the departmental dose-optimization program which includes automated exposure control, adjustment of the mA and/or kV according to patient size and/or use of iterative reconstruction technique. COMPARISON:  CT head  12/14/2020. FINDINGS: Brain: There is no evidence of acute intracranial hemorrhage, mass lesion, brain edema or extra-axial fluid collection. Stable atrophy with prominence of the subarachnoid spaces. No ventriculomegaly. Stable mild chronic small vessel ischemic changes in the periventricular white matter. There is no CT evidence of acute cortical infarction. Vascular:  No hyperdense vessel identified. Skull: Negative for fracture or focal lesion. Sinuses/Orbits: The visualized paranasal sinuses and mastoid air cells are clear. No orbital abnormalities are seen. Other: None. IMPRESSION: Stable noncontrast head CT.  No acute intracranial findings. Electronically Signed   By: Richardean Sale M.D.   On: 08/30/2021 15:44   MR BRAIN WO CONTRAST  Result Date: 08/30/2021 CLINICAL DATA:  Initial evaluation for acute mental status change. EXAM: MRI HEAD WITHOUT CONTRAST TECHNIQUE: Multiplanar, multiecho pulse sequences of the brain and surrounding structures were obtained without intravenous contrast. COMPARISON:  Prior head CT from earlier the same day. FINDINGS: Brain: Mildly advanced cerebral atrophy. Few scattered patchy foci of T2/FLAIR hyperintensity noted involving the supratentorial cerebral white matter,  nonspecific, but overall mild for age, and felt to be within normal limits. No abnormal foci of restricted diffusion to suggest acute or subacute ischemia. Gray-white matter differentiation maintained. No areas of chronic cortical infarction. No acute or chronic intracranial hemorrhage. No mass lesion, midline shift or mass effect no hydrocephalus or extra-axial fluid collection. Pituitary gland suprasellar region normal. Vascular: Major intracranial vascular flow voids are maintained. Skull and upper cervical spine: Craniocervical junction within normal limits. Bone marrow signal intensity normal. No scalp soft tissue abnormality. Sinuses/Orbits: Prior bilateral ocular lens replacement. Mild scattered mucosal thickening noted throughout the paranasal sinuses. No significant mastoid effusion. Inner ear structures grossly normal. Other: None. IMPRESSION: 1. No acute intracranial abnormality. 2. Mildly advanced cerebral atrophy. Electronically Signed   By: Jeannine Boga M.D.   On: 08/30/2021 21:13   NM Pulmonary Perfusion  Result Date: 08/31/2021 CLINICAL DATA:  Altered mental status. Dialysis patient. Unable to obtain additional information. EXAM: NUCLEAR MEDICINE PERFUSION LUNG SCAN TECHNIQUE: Perfusion images were obtained in multiple projections after intravenous injection of radiopharmaceutical. Ventilation scans intentionally deferred if perfusion scan and chest x-ray adequate for interpretation during COVID 19 epidemic. RADIOPHARMACEUTICALS:  4.2 mCi Tc-22m MAA IV COMPARISON:  Current chest radiograph FINDINGS: Normal perfusion. There are no segmental areas decreased perfusion to suggest pulmonary thromboembolism. IMPRESSION: Negative exam.  No evidence of pulmonary thromboembolism. Electronically Signed   By: Lajean Manes M.D.   On: 08/31/2021 10:13   US RENAL  Result Date: 09/01/2021 CLINICAL DATA:  Hematuria. EXAM: RENAL / URINARY TRACT ULTRASOUND COMPLETE COMPARISON:  CT, 05/22/2019.  FINDINGS: Right Kidney: Renal measurements: 10.8 x 6.6 x 6.2 cm = volume: 232 mL. Numerous cysts with significant thinning of the renal parenchyma. Midpole cyst, largest, 2.7 x 2.6 x 2.6 cm. No solid masses. No hydronephrosis. Left Kidney: Renal measurements: 10.7 x 6.1 x 4.1 cm = volume: 140 mL. Numerous cysts with significant thinning of the renal parenchyma. Largest cyst arises from the lower pole, 2.9 x 2.7 x 2.9 cm. No solid masses. No hydronephrosis. Bladder: Appears normal for degree of bladder distention. Other: None. IMPRESSION: 1. Numerous bilateral renal cysts with thinned intervening renal parenchyma, consistent with polycystic kidney disease. 2. No acute finding.  No hydronephrosis. Electronically Signed   By: Lajean Manes M.D.   On: 09/01/2021 15:19   DG Chest Portable 1 View  Result Date: 08/31/2021 CLINICAL DATA:  Altered mental status.  Dialysis patient. EXAM: PORTABLE CHEST 1 VIEW COMPARISON:  05/01/2021. FINDINGS: Cardiac silhouette is top-normal in size. No mediastinal or hilar masses. There are linear opacities at the right lung base consistent with atelectasis. Remainder of the lungs is clear. No convincing pleural effusion and no pneumothorax. Right posterolateral seventh rib fracture, which is of unclear chronicity, not evident on the most recent prior radiographs. Left subclavian to upper arm vascular stent is stable. IMPRESSION: 1. Right posterolateral seventh rib fracture of unclear chronicity, suspected to be subacute. No pneumothorax. 2. Mild linear right lung base atelectasis accentuated by low lung volumes. No convincing acute cardiopulmonary disease. Electronically Signed   By: Lajean Manes M.D.   On: 08/31/2021 10:21   ECHOCARDIOGRAM COMPLETE  Result Date: 08/31/2021    ECHOCARDIOGRAM REPORT   Patient Name:   [facility] Date of Exam: 08/31/2021 Medical Rec #:  [id number]         Height:       63.0 in Accession #:    [id number]        Weight:       147.7 lb Date of  Birth:  [birth year]          BSA:          1.700 m Patient Age:    29 years          BP:           95/51 mmHg Patient Gender: F                 HR:           75 bpm. Exam Location:  [facility] Procedure: 2D Echo and Strain Analysis Indications:     NSTEMI I21.4  History:         Patient has prior history of Echocardiogram examinations, most                  recent 11/15/2020.  Sonographer:     Kathlen Brunswick RDCS Referring Phys:  [id number] [place] Diagnosing Phys: Skeet Latch MD  Sonographer Comments: Global longitudinal strain was attempted. IMPRESSIONS  1. Left ventricular ejection fraction, by estimation, is 60 to 65%. The left ventricle has normal function. The left ventricle has no regional wall motion abnormalities. There is mild concentric left ventricular hypertrophy. Left ventricular diastolic parameters are indeterminate. The average left ventricular global longitudinal strain is -16.8 %. The global longitudinal strain is normal.  2. Right ventricular systolic function is normal. The right ventricular size is normal. There is mildly elevated pulmonary artery systolic pressure.  3. Left atrial size was severely dilated.  4. The mitral valve is normal in structure. Mild mitral valve regurgitation. No evidence of mitral stenosis.  5. Tricuspid valve regurgitation is mild to moderate.  6. The aortic valve is tricuspid. Aortic valve regurgitation is not visualized. No aortic stenosis is present.  7. The inferior vena cava is normal in size with greater than 50% respiratory variability, suggesting right atrial pressure of 3 mmHg. FINDINGS  Left Ventricle: Left ventricular ejection fraction, by estimation, is 60 to 65%. The left ventricle has normal function. The left ventricle has no regional wall motion abnormalities. The average left ventricular global longitudinal strain is -16.8 %. The global longitudinal strain is normal. The left ventricular internal cavity size was normal in size. There is mild concentric  left ventricular hypertrophy. Left ventricular diastolic parameters are indeterminate. Indeterminate filling pressures. Right Ventricle: The right ventricular size is normal. No increase in right ventricular wall thickness. Right ventricular systolic function is  normal. There is mildly elevated pulmonary artery systolic pressure. The tricuspid regurgitant velocity is 3.20  m/s, and with an assumed right atrial pressure of 3 mmHg, the estimated right ventricular systolic pressure is 66.4 mmHg. Left Atrium: Left atrial size was severely dilated. Right Atrium: Right atrial size was normal in size. Pericardium: There is no evidence of pericardial effusion. Mitral Valve: The mitral valve is normal in structure. Mild mitral valve regurgitation. No evidence of mitral valve stenosis. Tricuspid Valve: The tricuspid valve is normal in structure. Tricuspid valve regurgitation is mild to moderate. No evidence of tricuspid stenosis. Aortic Valve: The aortic valve is tricuspid. Aortic valve regurgitation is not visualized. No aortic stenosis is present. Aortic valve peak gradient measures 13.1 mmHg. Pulmonic Valve: The pulmonic valve was normal in structure. Pulmonic valve regurgitation is trivial. No evidence of pulmonic stenosis. Aorta: The aortic root is normal in size and structure. Venous: The inferior vena cava is normal in size with greater than 50% respiratory variability, suggesting right atrial pressure of 3 mmHg. IAS/Shunts: No atrial level shunt detected by color flow Doppler.  LEFT VENTRICLE PLAX 2D LVIDd:         4.67 cm     Diastology LVIDs:         2.95 cm     LV e' medial:    8.27 cm/s LV PW:         1.14 cm     LV E/e' medial:  13.9 LV IVS:        1.15 cm     LV e' lateral:   9.90 cm/s LVOT diam:     1.80 cm     LV E/e' lateral: 11.6 LV SV:         81 LV SV Index:   48          2D Longitudinal Strain LVOT Area:     2.54 cm    2D Strain GLS (A2C):   -17.5 %                            2D Strain GLS (A3C):   -17.2  %                            2D Strain GLS (A4C):   -15.6 % LV Volumes (MOD)           2D Strain GLS Avg:     -16.8 % LV vol d, MOD A2C: 39.4 ml LV vol d, MOD A4C: 63.6 ml LV vol s, MOD A2C: 11.0 ml LV vol s, MOD A4C: 17.9 ml LV SV MOD A2C:     28.4 ml LV SV MOD A4C:     63.6 ml LV SV MOD BP:      36.0 ml RIGHT VENTRICLE RV Basal diam:  2.80 cm RV S prime:     14.90 cm/s TAPSE (M-mode): 2.2 cm LEFT ATRIUM             Index        RIGHT ATRIUM           Index LA diam:        4.40 cm 2.59 cm/m   RA Area:     13.70 cm LA Vol (A2C):   90.3 ml 53.12 ml/m  RA Volume:   34.30 ml  20.18 ml/m LA Vol (A4C):   98.0 ml 57.65 ml/m LA Biplane  Vol: 94.3 ml 55.47 ml/m  AORTIC VALVE                 PULMONIC VALVE AV Area (Vmax): 2.35 cm     PV Vmax:       1.25 m/s AV Vmax:        181.00 cm/s  PV Peak grad:  6.2 mmHg AV Peak Grad:   13.1 mmHg LVOT Vmax:      167.00 cm/s LVOT Vmean:     97.400 cm/s LVOT VTI:       0.318 m  AORTA Ao Root diam: 3.00 cm Ao Asc diam:  3.00 cm MITRAL VALVE                TRICUSPID VALVE MV Area (PHT): 2.78 cm     TV Peak grad:   39.5 mmHg MV Decel Time: 273 msec     TV Vmax:        3.14 m/s MV E velocity: 115.00 cm/s  TR Peak grad:   41.0 mmHg MV A velocity: 95.10 cm/s   TR Vmax:        320.00 cm/s MV E/A ratio:  1.21                             SHUNTS                             Systemic VTI:  0.32 m                             Systemic Diam: 1.80 cm Skeet Latch MD Electronically signed by Skeet Latch MD Signature Date/Time: 08/31/2021/2:53:39 PM    Final      Assessment and Plan:   # Labile HTN:  # hypertensive urgency: She had hypotension with dialysis and was started on midodrine.  The other day in her dialysis session her blood pressure increased over 230 and she became altered.  Blood pressures have been stable in the hospital on metoprolol and she has not had any midodrine.  Recommend giving her 5 mg of midodrine prior to HD continue metoprolol.  Instead of 10 mg.  #  Elevated troponin:  Likely demand ischemia.  However last value checked increased significantly.  She also continues to complain of chest tightness.  Therefore we will plan to get a nuclear stress test tomorrow.  Continue aspirin, clopidogrel, metoprolol, and atorvastatin.  Clopidogrel was added this admission.  Plan to continue heparin for 48 hours.  Risk Assessment/Risk Scores:     TIMI Risk Score for Unstable Angina or Non-ST Elevation MI:   The patient's TIMI risk score is 5, which indicates a 26% risk of all cause mortality, new or recurrent myocardial infarction or need for urgent revascularization in the next 14 days.      For questions or updates, please contact [place] Please consult [email or web address] for contact info under    Signed, Skeet Latch, MD  09/01/2021 5:13 PM

## 2021-09-01 NOTE — Progress Notes (Signed)
[facility] [place] NAME: Joyce [name]    MR#:  [id number]  DATE OF BIRTH:  1946/02/28  SUBJECTIVE:   patient came in from dialysis center after she was found lethargic tachycardic and low blood pressure. No family at bedside patient denies any chest pain shortness of breath. She feels at baseline. No new issues per RN VITALS:  Blood pressure (!) 136/59, pulse 74, temperature 97.6 F (36.4 C), temperature source Oral, resp. rate 16, height 5\' 3"  (1.6 m), weight 67 kg, SpO2 95 %.  PHYSICAL EXAMINATION:   GENERAL:  76 y.o.-year-old patient lying in the bed with no acute distress.  LUNGS: decreased breath sounds bilaterally, no wheezing, rales, rhonchi.  CARDIOVASCULAR: S1, S2 normal. No murmurs, rubs, or gallops.  ABDOMEN: Soft, nontender, nondistended. Bowel sounds present.  EXTREMITIES: No  edema b/l.   HD access NEUROLOGIC: nonfocal  patient is alert and awake SKIN: No obvious rash, lesion, or ulcer.   LABORATORY PANEL:  CBC Recent Labs  Lab 09/01/21 0502  WBC 11.5*  HGB 9.0*  HCT 27.8*  PLT 187     Chemistries  Recent Labs  Lab 08/30/21 1514 08/31/21 0532  NA 141 139  K 3.3* 3.8  CL 102 102  CO2 24 27  GLUCOSE 150* 109*  BUN 14 24*  CREATININE 3.31* 4.75*  CALCIUM 8.7* 8.9  AST 22  --   ALT 12  --   ALKPHOS 71  --   BILITOT 0.5  --     Cardiac Enzymes No results for input(s): TROPONINI in the last 168 hours. RADIOLOGY:  CT HEAD WO CONTRAST (5MM)  Result Date: 08/30/2021 CLINICAL DATA:  Mental status change, unknown cause.  Weakness. EXAM: CT HEAD WITHOUT CONTRAST TECHNIQUE: Contiguous axial images were obtained from the base of the skull through the vertex without intravenous contrast. RADIATION DOSE REDUCTION: This exam was performed according to the departmental dose-optimization program which includes automated exposure control, adjustment of the mA and/or kV according to patient size and/or use of iterative  reconstruction technique. COMPARISON:  CT head 12/14/2020. FINDINGS: Brain: There is no evidence of acute intracranial hemorrhage, mass lesion, brain edema or extra-axial fluid collection. Stable atrophy with prominence of the subarachnoid spaces. No ventriculomegaly. Stable mild chronic small vessel ischemic changes in the periventricular white matter. There is no CT evidence of acute cortical infarction. Vascular:  No hyperdense vessel identified. Skull: Negative for fracture or focal lesion. Sinuses/Orbits: The visualized paranasal sinuses and mastoid air cells are clear. No orbital abnormalities are seen. Other: None. IMPRESSION: Stable noncontrast head CT.  No acute intracranial findings. Electronically Signed   By: Richardean Sale M.D.   On: 08/30/2021 15:44   MR BRAIN WO CONTRAST  Result Date: 08/30/2021 CLINICAL DATA:  Initial evaluation for acute mental status change. EXAM: MRI HEAD WITHOUT CONTRAST TECHNIQUE: Multiplanar, multiecho pulse sequences of the brain and surrounding structures were obtained without intravenous contrast. COMPARISON:  Prior head CT from earlier the same day. FINDINGS: Brain: Mildly advanced cerebral atrophy. Few scattered patchy foci of T2/FLAIR hyperintensity noted involving the supratentorial cerebral white matter, nonspecific, but overall mild for age, and felt to be within normal limits. No abnormal foci of restricted diffusion to suggest acute or subacute ischemia. Gray-white matter differentiation maintained. No areas of chronic cortical infarction. No acute or chronic intracranial hemorrhage. No mass lesion, midline shift or mass effect no hydrocephalus or extra-axial fluid collection. Pituitary gland suprasellar region normal. Vascular: Major intracranial  vascular flow voids are maintained. Skull and upper cervical spine: Craniocervical junction within normal limits. Bone marrow signal intensity normal. No scalp soft tissue abnormality. Sinuses/Orbits: Prior bilateral  ocular lens replacement. Mild scattered mucosal thickening noted throughout the paranasal sinuses. No significant mastoid effusion. Inner ear structures grossly normal. Other: None. IMPRESSION: 1. No acute intracranial abnormality. 2. Mildly advanced cerebral atrophy. Electronically Signed   By: Jeannine Boga M.D.   On: 08/30/2021 21:13   NM Pulmonary Perfusion  Result Date: 08/31/2021 CLINICAL DATA:  Altered mental status. Dialysis patient. Unable to obtain additional information. EXAM: NUCLEAR MEDICINE PERFUSION LUNG SCAN TECHNIQUE: Perfusion images were obtained in multiple projections after intravenous injection of radiopharmaceutical. Ventilation scans intentionally deferred if perfusion scan and chest x-ray adequate for interpretation during COVID 19 epidemic. RADIOPHARMACEUTICALS:  4.2 mCi Tc-53m MAA IV COMPARISON:  Current chest radiograph FINDINGS: Normal perfusion. There are no segmental areas decreased perfusion to suggest pulmonary thromboembolism. IMPRESSION: Negative exam.  No evidence of pulmonary thromboembolism. Electronically Signed   By: Lajean Manes M.D.   On: 08/31/2021 10:13   DG Chest Portable 1 View  Result Date: 08/31/2021 CLINICAL DATA:  Altered mental status.  Dialysis patient. EXAM: PORTABLE CHEST 1 VIEW COMPARISON:  05/01/2021. FINDINGS: Cardiac silhouette is top-normal in size. No mediastinal or hilar masses. There are linear opacities at the right lung base consistent with atelectasis. Remainder of the lungs is clear. No convincing pleural effusion and no pneumothorax. Right posterolateral seventh rib fracture, which is of unclear chronicity, not evident on the most recent prior radiographs. Left subclavian to upper arm vascular stent is stable. IMPRESSION: 1. Right posterolateral seventh rib fracture of unclear chronicity, suspected to be subacute. No pneumothorax. 2. Mild linear right lung base atelectasis accentuated by low lung volumes. No convincing acute  cardiopulmonary disease. Electronically Signed   By: Lajean Manes M.D.   On: 08/31/2021 10:21   ECHOCARDIOGRAM COMPLETE  Result Date: 08/31/2021    ECHOCARDIOGRAM REPORT   Patient Name:   Joyce [name] [facility] Date of Exam: 08/31/2021 Medical Rec #:  [id number]         Height:       63.0 in Accession #:    [id number]        Weight:       147.7 lb Date of Birth:  August 20, 1945          BSA:          1.700 m Patient Age:    61 years          BP:           95/51 mmHg Patient Gender: F                 HR:           75 bpm. Exam Location:  [facility] Procedure: 2D Echo and Strain Analysis Indications:     NSTEMI I21.4  History:         Patient has prior history of Echocardiogram examinations, most                  recent 11/15/2020.  Sonographer:     Kathlen Brunswick RDCS Referring Phys:  [id number] [place] Diagnosing Phys: Skeet Latch MD  Sonographer Comments: Global longitudinal strain was attempted. IMPRESSIONS  1. Left ventricular ejection fraction, by estimation, is 60 to 65%. The left ventricle has normal function. The left ventricle has no regional wall motion abnormalities. There is mild concentric left ventricular hypertrophy. Left  ventricular diastolic parameters are indeterminate. The average left ventricular global longitudinal strain is -16.8 %. The global longitudinal strain is normal.  2. Right ventricular systolic function is normal. The right ventricular size is normal. There is mildly elevated pulmonary artery systolic pressure.  3. Left atrial size was severely dilated.  4. The mitral valve is normal in structure. Mild mitral valve regurgitation. No evidence of mitral stenosis.  5. Tricuspid valve regurgitation is mild to moderate.  6. The aortic valve is tricuspid. Aortic valve regurgitation is not visualized. No aortic stenosis is present.  7. The inferior vena cava is normal in size with greater than 50% respiratory variability, suggesting right atrial pressure of 3 mmHg. FINDINGS  Left Ventricle: Left  ventricular ejection fraction, by estimation, is 60 to 65%. The left ventricle has normal function. The left ventricle has no regional wall motion abnormalities. The average left ventricular global longitudinal strain is -16.8 %. The global longitudinal strain is normal. The left ventricular internal cavity size was normal in size. There is mild concentric left ventricular hypertrophy. Left ventricular diastolic parameters are indeterminate. Indeterminate filling pressures. Right Ventricle: The right ventricular size is normal. No increase in right ventricular wall thickness. Right ventricular systolic function is normal. There is mildly elevated pulmonary artery systolic pressure. The tricuspid regurgitant velocity is 3.20  m/s, and with an assumed right atrial pressure of 3 mmHg, the estimated right ventricular systolic pressure is 60.7 mmHg. Left Atrium: Left atrial size was severely dilated. Right Atrium: Right atrial size was normal in size. Pericardium: There is no evidence of pericardial effusion. Mitral Valve: The mitral valve is normal in structure. Mild mitral valve regurgitation. No evidence of mitral valve stenosis. Tricuspid Valve: The tricuspid valve is normal in structure. Tricuspid valve regurgitation is mild to moderate. No evidence of tricuspid stenosis. Aortic Valve: The aortic valve is tricuspid. Aortic valve regurgitation is not visualized. No aortic stenosis is present. Aortic valve peak gradient measures 13.1 mmHg. Pulmonic Valve: The pulmonic valve was normal in structure. Pulmonic valve regurgitation is trivial. No evidence of pulmonic stenosis. Aorta: The aortic root is normal in size and structure. Venous: The inferior vena cava is normal in size with greater than 50% respiratory variability, suggesting right atrial pressure of 3 mmHg. IAS/Shunts: No atrial level shunt detected by color flow Doppler.  LEFT VENTRICLE PLAX 2D LVIDd:         4.67 cm     Diastology LVIDs:         2.95 cm      LV e' medial:    8.27 cm/s LV PW:         1.14 cm     LV E/e' medial:  13.9 LV IVS:        1.15 cm     LV e' lateral:   9.90 cm/s LVOT diam:     1.80 cm     LV E/e' lateral: 11.6 LV SV:         81 LV SV Index:   48          2D Longitudinal Strain LVOT Area:     2.54 cm    2D Strain GLS (A2C):   -17.5 %                            2D Strain GLS (A3C):   -17.2 %  2D Strain GLS (A4C):   -15.6 % LV Volumes (MOD)           2D Strain GLS Avg:     -16.8 % LV vol d, MOD A2C: 39.4 ml LV vol d, MOD A4C: 63.6 ml LV vol s, MOD A2C: 11.0 ml LV vol s, MOD A4C: 17.9 ml LV SV MOD A2C:     28.4 ml LV SV MOD A4C:     63.6 ml LV SV MOD BP:      36.0 ml RIGHT VENTRICLE RV Basal diam:  2.80 cm RV S prime:     14.90 cm/s TAPSE (M-mode): 2.2 cm LEFT ATRIUM             Index        RIGHT ATRIUM           Index LA diam:        4.40 cm 2.59 cm/m   RA Area:     13.70 cm LA Vol (A2C):   90.3 ml 53.12 ml/m  RA Volume:   34.30 ml  20.18 ml/m LA Vol (A4C):   98.0 ml 57.65 ml/m LA Biplane Vol: 94.3 ml 55.47 ml/m  AORTIC VALVE                 PULMONIC VALVE AV Area (Vmax): 2.35 cm     PV Vmax:       1.25 m/s AV Vmax:        181.00 cm/s  PV Peak grad:  6.2 mmHg AV Peak Grad:   13.1 mmHg LVOT Vmax:      167.00 cm/s LVOT Vmean:     97.400 cm/s LVOT VTI:       0.318 m  AORTA Ao Root diam: 3.00 cm Ao Asc diam:  3.00 cm MITRAL VALVE                TRICUSPID VALVE MV Area (PHT): 2.78 cm     TV Peak grad:   39.5 mmHg MV Decel Time: 273 msec     TV Vmax:        3.14 m/s MV E velocity: 115.00 cm/s  TR Peak grad:   41.0 mmHg MV A velocity: 95.10 cm/s   TR Vmax:        320.00 cm/s MV E/A ratio:  1.21                             SHUNTS                             Systemic VTI:  0.32 m                             Systemic Diam: 1.80 cm Skeet Latch MD Electronically signed by Skeet Latch MD Signature Date/Time: 08/31/2021/2:53:39 PM    Final     Assessment and Plan Joyce [name] is a 76 y.o. Caucasian female with  medical history significant for bipolar disorder, ESRD on hemodialysis MWF, GERD, hypertension, pancreatitis and vertigo, who presented to the ER with acute onset of altered mental status with confusion and generalized fatigue during her hemodialysis session for which she was sent to the ER.Upon presentation to the ER, BP was 179/77.  She was reportedly having a systolic BP of 378 earlier and was tachycardic.  Noncontrast head CT scan revealed  no acute intracranial abnormalities. Brain MRI revealed mild advanced cerebral atrophy with no acute intracranial normalities.  Acute metabolic encephalopathy/altered mental status etiology unclear resolved -- could be due to high blood pressure but now stable -- CT had an MRI brain nothing acute -- patient alert and oriented times three  elevated troponin without chest pain and no acute EKG changes -- suspect could be due to elevated blood pressure -- no cardiac history per patient -- patient denies chest pain -- VQ scan negative -- [facility] cardiology consulted-- discussed with Dr. Oval Linsey. -- Patient currently on IV heparin drip hopefully will be discontinued once seen by cardiology. -- continue aspirin and statins. Plavix was added during this -- Echo EF 60-65%. Mildly elevated right atrial pressure. Increased size of LA  end-stage renal disease on hemodialysis -- will continue in-house dialysis  bipolar disorder -- continue home meds  patient ambulates by herself using [facility] cane at [facility].  Anticipate discharge tomorrow after dialysis to [place] communication : none today Consults : [facility] cardiology CODE STATUS: DNR. This was discussed with patient by me in the emergency room DVT Prophylaxis : heparin Level of care: Progressive Status is: Inpatient Remains inpatient appropriate because: end-stage renal disease, elevated troponin      TOTAL TIME TAKING CARE OF THIS PATIENT: 25 minutes.  >50% time spent on  counselling and coordination of care  Note: This dictation was prepared with Dragon dictation along with smaller phrase technology. Any transcriptional errors that result from this process are unintentional.  Fritzi Mandes M.D    [facility]   CC: Primary care physician; [facility]

## 2021-09-01 NOTE — Consult Note (Signed)
[place] for heparin Indication: chest pain/ACS  Allergies  Allergen Reactions   Morphine And Related Shortness Of Breath    Pt reports chest pain and difficulty breathing.   Gabapentin     PER MAR   Indomethacin Hives   Penicillins Other (See Comments)    PER MAR   Pollen Extract Other (See Comments)    Sinus problems and HA    Patient Measurements: Height: 5\' 3"  (160 cm) Weight: 67 kg (147 lb 11.3 oz) IBW/kg (Calculated) : 52.4 Heparin Dosing Weight: 65.9 kg  Vital Signs: Temp: 98.6 F (37 C) (02/11 2000) Temp Source: Oral (02/11 2000) BP: 121/49 (02/11 2000) Pulse Rate: 88 (02/11 2000)  Labs: Recent Labs    08/30/21 1514 08/30/21 1741 08/30/21 1949 08/31/21 0532 08/31/21 1427 08/31/21 2254  HGB 10.6*  --   --  10.5*  --   --   HCT 31.9*  --   --  32.6*  --   --   PLT 184  --   --  207  --   --   APTT  --   --  31  --   --   --   LABPROT  --   --  14.7  --   --   --   INR  --   --  1.2  --   --   --   HEPARINUNFRC  --   --   --  0.27* 0.22* 0.58  CREATININE 3.31*  --   --  4.75*  --   --   TROPONINIHS 77* 527*  --   --   --   --      Estimated Creatinine Clearance: 9.5 mL/min (A) (by C-G formula based on SCr of 4.75 mg/dL (H)).   Medical History: Past Medical History:  Diagnosis Date   Anal fissure    Bipolar affective disorder ([place])    CKD (chronic kidney disease)    Dr Holley Raring [name] 4   Colon polyps    Diverticulitis    Diverticulitis    Family history of adverse reaction to anesthesia    mom - PONV   GERD (gastroesophageal reflux disease)    Headache    migraines - none over 10 yrs   Heart murmur    History of hiatal hernia    Hypertension    Pancreatitis    Valproic acid   Vertigo     Medications:  No PTA anticoagulation or antiplatelets   Assessment: 76 y.o. female with a past medical history of ESRD on HD presents to the emergency department for confusion, hypertension and tachycardia.  Troponin elevated to 527. Pharmacy has been consulted for heparin dosing for ACS.   Baseline labs: Hgb 10.6, plts 184, aPTT and INR ordered  Goal of Therapy:  Heparin level 0.3-0.7 units/ml Monitor platelets by anticoagulation protocol: Yes  2/11 0532 HL 0.27 2/11 2254 HL 0.58, therapeutic x 1   Plan:  Continue heparin infusion at 1,000 units/hour Recheck HL with AM to confirm CBC daily while on heparin   Renda Rolls, PharmD, [facility] 09/01/2021 1:05 AM

## 2021-09-01 NOTE — Consult Note (Signed)
[place] for heparin Indication: chest pain/ACS  Allergies  Allergen Reactions   Morphine And Related Shortness Of Breath    Pt reports chest pain and difficulty breathing.   Gabapentin     PER MAR   Indomethacin Hives   Penicillins Other (See Comments)    PER MAR   Pollen Extract Other (See Comments)    Sinus problems and HA    Patient Measurements: Height: 5\' 3"  (160 cm) Weight: 67 kg (147 lb 11.3 oz) IBW/kg (Calculated) : 52.4 Heparin Dosing Weight: 65.9 kg  Vital Signs: Temp: 98.9 F (37.2 C) (02/12 0400) Temp Source: Axillary (02/12 0000) BP: 135/53 (02/12 0400) Pulse Rate: 89 (02/12 0400)  Labs: Recent Labs    08/30/21 1514 08/30/21 1514 08/30/21 1741 08/30/21 1949 08/31/21 0532 08/31/21 1427 08/31/21 2254 09/01/21 0502  HGB 10.6*  --   --   --  10.5*  --   --  9.0*  HCT 31.9*  --   --   --  32.6*  --   --  27.8*  PLT 184  --   --   --  207  --   --  187  APTT  --   --   --  31  --   --   --   --   LABPROT  --   --   --  14.7  --   --   --   --   INR  --   --   --  1.2  --   --   --   --   HEPARINUNFRC  --    < >  --   --  0.27* 0.22* 0.58 0.34  CREATININE 3.31*  --   --   --  4.75*  --   --   --   TROPONINIHS 77*  --  527*  --   --   --   --   --    < > = values in this interval not displayed.     Estimated Creatinine Clearance: 9.5 mL/min (A) (by C-G formula based on SCr of 4.75 mg/dL (H)).   Medical History: Past Medical History:  Diagnosis Date   Anal fissure    Bipolar affective disorder ([place])    CKD (chronic kidney disease)    Dr Holley Raring [name] 4   Colon polyps    Diverticulitis    Diverticulitis    Family history of adverse reaction to anesthesia    mom - PONV   GERD (gastroesophageal reflux disease)    Headache    migraines - none over 10 yrs   Heart murmur    History of hiatal hernia    Hypertension    Pancreatitis    Valproic acid   Vertigo     Medications:  No PTA anticoagulation  or antiplatelets   Assessment: 76 y.o. female with a past medical history of ESRD on HD presents to the emergency department for confusion, hypertension and tachycardia. Troponin elevated to 527. Pharmacy has been consulted for heparin dosing for ACS.   Baseline labs: Hgb 10.6, plts 184, aPTT and INR ordered  Goal of Therapy:  Heparin level 0.3-0.7 units/ml Monitor platelets by anticoagulation protocol: Yes  2/11 0532 HL 0.27 2/11 2254 HL 0.58, therapeutic x 1 2/12 0502 HL 0.34, therapeutic x 2, trending down fast   Plan:  Increase heparin infusion slightly to 1,100 units/hour Recheck HL in 8 hr to reconfirm, then daily  CBC daily while on heparin   Renda Rolls, PharmD, [place] 09/01/2021 6:48 AM

## 2021-09-01 NOTE — Consult Note (Signed)
[place] for heparin Indication: chest pain/ACS  Allergies  Allergen Reactions   Morphine And Related Shortness Of Breath    Pt reports chest pain and difficulty breathing.   Gabapentin     PER MAR   Indomethacin Hives   Penicillins Other (See Comments)    PER MAR   Pollen Extract Other (See Comments)    Sinus problems and HA    Patient Measurements: Height: 5\' 3"  (160 cm) Weight: 67 kg (147 lb 11.3 oz) IBW/kg (Calculated) : 52.4 Heparin Dosing Weight: 65.9 kg  Vital Signs: Temp: 97.6 F (36.4 C) (02/12 0825) Temp Source: Oral (02/12 0825) BP: 136/59 (02/12 0825) Pulse Rate: 74 (02/12 0825)  Labs: Recent Labs    08/30/21 1514 08/30/21 1741 08/30/21 1949 08/31/21 0532 08/31/21 1427 08/31/21 2254 09/01/21 0502 09/01/21 1258 09/01/21 1508  HGB 10.6*  --   --  10.5*  --   --  9.0*  --   --   HCT 31.9*  --   --  32.6*  --   --  27.8*  --   --   PLT 184  --   --  207  --   --  187  --   --   APTT  --   --  31  --   --   --   --   --   --   LABPROT  --   --  14.7  --   --   --   --   --   --   INR  --   --  1.2  --   --   --   --   --   --   HEPARINUNFRC  --   --   --  0.27*   < > 0.58 0.34  --  0.24*  CREATININE 3.31*  --   --  4.75*  --   --   --   --   --   TROPONINIHS 77* 527*  --   --   --   --   --  1,920*  --    < > = values in this interval not displayed.     Estimated Creatinine Clearance: 9.5 mL/min (A) (by C-G formula based on SCr of 4.75 mg/dL (H)).   Medical History: Past Medical History:  Diagnosis Date   Anal fissure    Bipolar affective disorder ([place])    CKD (chronic kidney disease)    Dr Holley Raring [name] 4   Colon polyps    Diverticulitis    Diverticulitis    Family history of adverse reaction to anesthesia    mom - PONV   GERD (gastroesophageal reflux disease)    Headache    migraines - none over 10 yrs   Heart murmur    History of hiatal hernia    Hypertension    Pancreatitis    Valproic acid    Vertigo     Medications:  No PTA anticoagulation or antiplatelets   Assessment: 76 y.o. female with a past medical history of ESRD on HD presents to the emergency department for confusion, hypertension and tachycardia. Troponin elevated to 527. Pharmacy has been consulted for heparin dosing for ACS.   Baseline labs: Hgb 10.6, plts 184, aPTT and INR ordered  Goal of Therapy:  Heparin level 0.3-0.7 units/ml Monitor platelets by anticoagulation protocol: Yes  2/11 0532 HL 0.27 2/11 2254 HL 0.58, therapeutic x 1 2/12 0502 HL  0.34, therapeutic x 2, trending down fast 2/12 1508 HL 0.24, subtherapeutic   Plan:  Heparin level subtherapeutic. Will give 1000 unit bolus and increase heparin infusion to 1200 units/hr Recheck HL 8 hr after rate change CBC daily while on heparin   Sherilyn Banker, PharmD Clinical Pharmacist  09/01/2021 3:45 PM

## 2021-09-02 ENCOUNTER — Inpatient Hospital Stay (HOSPITAL_COMMUNITY): Payer: Medicare HMO

## 2021-09-02 ENCOUNTER — Encounter: Payer: Self-pay | Admitting: Family Medicine

## 2021-09-02 DIAGNOSIS — I248 Other forms of acute ischemic heart disease: Secondary | ICD-10-CM

## 2021-09-02 DIAGNOSIS — I160 Hypertensive urgency: Secondary | ICD-10-CM

## 2021-09-02 DIAGNOSIS — N186 End stage renal disease: Secondary | ICD-10-CM

## 2021-09-02 DIAGNOSIS — Z992 Dependence on renal dialysis: Secondary | ICD-10-CM

## 2021-09-02 LAB — NM MYOCAR MULTI W/SPECT W/WALL MOTION / EF
Base ST Depression (mm): 0 mm
LV dias vol: 74 mL (ref 46–106)
LV sys vol: 23 mL
MPHR: 146 {beats}/min
Nuc Stress EF: 69 %
Peak HR: 106 {beats}/min
Percent HR: 72 %
Rest HR: 78 {beats}/min
Rest Nuclear Isotope Dose: 9.8 mCi
SDS: 0
SRS: 0
SSS: 0
ST Depression (mm): 0 mm
Stress Nuclear Isotope Dose: 30.6 mCi
TID: 1.02

## 2021-09-02 LAB — HEPATITIS B SURFACE ANTIGEN: Hepatitis B Surface Ag: NONREACTIVE

## 2021-09-02 LAB — HEPATITIS B SURFACE ANTIBODY, QUANTITATIVE
Hep B S AB Quant (Post): 407.9 m[IU]/mL (ref >9.9)
Hep B S AB Quant (Post): 413.1 m[IU]/mL (ref >9.9)

## 2021-09-02 LAB — HEPARIN LEVEL (UNFRACTIONATED)
Heparin Unfractionated: 0.39 IU/mL (ref 0.30–0.70)
Heparin Unfractionated: 0.44 IU/mL (ref 0.30–0.70)

## 2021-09-02 LAB — HEPATITIS B SURFACE ANTIBODY,QUALITATIVE: Hep B S Ab: REACTIVE — AB

## 2021-09-02 LAB — CBC
HCT: 27.6 % — ABNORMAL LOW (ref 36.0–46.0)
Hemoglobin: 9 g/dL — ABNORMAL LOW (ref 12.0–15.0)
MCH: 31.8 pg (ref 26.0–34.0)
MCHC: 32.6 g/dL (ref 30.0–36.0)
MCV: 97.5 fL (ref 80.0–100.0)
Platelets: 196 10*3/uL (ref 150–400)
RBC: 2.83 MIL/uL — ABNORMAL LOW (ref 3.87–5.11)
RDW: 13.2 % (ref 11.5–15.5)
WBC: 13.2 10*3/uL — ABNORMAL HIGH (ref 4.0–10.5)
nRBC: 0 % (ref 0.0–0.2)

## 2021-09-02 LAB — HEPATITIS B E ANTIGEN: Hep B E Ag: NEGATIVE

## 2021-09-02 MED ORDER — NITROGLYCERIN 0.4 MG SL SUBL: 0.4000 mg | SUBLINGUAL_TABLET | SUBLINGUAL | 12 refills | Status: DC | PRN

## 2021-09-02 MED ORDER — METOPROLOL TARTRATE 25 MG PO TABS: 25.0000 mg | ORAL_TABLET | Freq: Two times a day (BID) | ORAL | 0 refills | Status: DC

## 2021-09-02 MED ORDER — TECHNETIUM TC 99M TETROFOSMIN IV KIT
9.8900 | PACK | Freq: Once | INTRAVENOUS | Status: AC | PRN
  Administered 2021-09-02: 9.89 via INTRAVENOUS

## 2021-09-02 MED ORDER — TECHNETIUM TC 99M TETROFOSMIN IV KIT
30.0000 | PACK | Freq: Once | INTRAVENOUS | Status: AC | PRN
  Administered 2021-09-02: 30.6 via INTRAVENOUS

## 2021-09-02 MED ORDER — REGADENOSON 0.4 MG/5ML IV SOLN
0.4000 mg | Freq: Once | INTRAVENOUS | Status: AC
  Administered 2021-09-02: 0.4 mg via INTRAVENOUS

## 2021-09-02 MED ORDER — METOPROLOL TARTRATE 25 MG PO TABS: 25.0000 mg | ORAL_TABLET | Freq: Two times a day (BID) | ORAL | 1 refills | Status: DC

## 2021-09-02 MED ORDER — METOPROLOL TARTRATE 25 MG PO TABS: 25.0000 mg | ORAL_TABLET | Freq: Two times a day (BID) | ORAL | Status: DC

## 2021-09-02 NOTE — Progress Notes (Signed)
[facility]  ROUNDING NOTE   Subjective:   Joyce [name] is a 76 year old female with a past medical history including hypertension, pancreatitis, GERD, vertigo, and end-stage renal disease on hemodialysis.  Patient presents to the emergency department with complaints of confusion and fatigue while receiving outpatient dialysis treatment.  Patient has been admitted for NSTEMI (non-ST elevated myocardial infarction) ([place]) [I21.4] Altered mental status, unspecified altered mental status type [R41.82]  Patient is known to our clinic and receives outpatient treatments at [facility] on a MWF schedule, supervised by Dr. Holley Raring.    Patient seen resting after stress test Drowsy but oriented  Scheduled for dialysis later today   Objective:  Vital signs in last 24 hours:  Temp:  [98.4 F (36.9 C)-100 F (37.8 C)] 98.6 F (37 C) (02/13 1121) Pulse Rate:  [64-93] 85 (02/13 1345) Resp:  [15-22] 20 (02/13 1345) BP: (109-154)/(53-123) 122/60 (02/13 1345) SpO2:  [94 %-99 %] 99 % (02/13 1345) Weight:  [65.4 kg] 65.4 kg (02/13 1203)  Weight change:  Filed Weights   08/30/21 1509 09/02/21 1203  Weight: 67 kg 65.4 kg    Intake/Output: I/O last 3 completed shifts: In: 868.1 [P.O.:680; I.V.:188.1] Out: 850 [Urine:850]   Intake/Output this shift:  Total I/O In: -  Out: 300 [Urine:300]  Physical Exam: General: NAD, resting quietly  Head: Normocephalic, atraumatic. Moist oral mucosal membranes  Eyes: Anicteric  Lungs:  Clear to auscultation, normal effort  Heart: Regular rate and rhythm  Abdomen:  Soft, nontender  Extremities: Trace peripheral edema.  Neurologic: Nonfocal, moving all four extremities  Skin: No lesions  Access: LUE aVF    Basic Metabolic Panel: Recent Labs  Lab 08/30/21 1514 08/31/21 0532  NA 141 139  K 3.3* 3.8  CL 102 102  CO2 24 27  GLUCOSE 150* 109*  BUN 14 24*  CREATININE 3.31* 4.75*  CALCIUM 8.7* 8.9     Liver Function  Tests: Recent Labs  Lab 08/30/21 1514  AST 22  ALT 12  ALKPHOS 71  BILITOT 0.5  PROT 6.5  ALBUMIN 3.4*    No results for input(s): LIPASE, AMYLASE in the last 168 hours. No results for input(s): AMMONIA in the last 168 hours.  CBC: Recent Labs  Lab 08/30/21 1514 08/31/21 0532 09/01/21 0502 09/02/21 0758  WBC 14.7* 16.0* 11.5* 13.2*  HGB 10.6* 10.5* 9.0* 9.0*  HCT 31.9* 32.6* 27.8* 27.6*  MCV 98.5 99.1 97.9 97.5  PLT 184 207 187 196     Cardiac Enzymes: No results for input(s): CKTOTAL, CKMB, CKMBINDEX, TROPONINI in the last 168 hours.  BNP: Invalid input(s): POCBNP  CBG: No results for input(s): GLUCAP in the last 168 hours.  Microbiology: Results for orders placed or performed during the hospital encounter of 08/30/21  Resp Panel by RT-PCR (Flu A&B, Covid) Nasopharyngeal Swab     Status: None   Collection Time: 08/30/21  3:14 PM   Specimen: Nasopharyngeal Swab; Nasopharyngeal(NP) swabs in vial transport medium  Result Value Ref Range Status   SARS Coronavirus 2 by RT PCR NEGATIVE NEGATIVE Final    Comment: (NOTE) SARS-CoV-2 target nucleic acids are NOT DETECTED.  The SARS-CoV-2 RNA is generally detectable in upper respiratory specimens during the acute phase of infection. The lowest concentration of SARS-CoV-2 viral copies this assay can detect is 138 copies/mL. A negative result does not preclude SARS-Cov-2 infection and should not be used as the sole basis for treatment or other patient management decisions. A negative result may occur  with  improper specimen collection/handling, submission of specimen other than nasopharyngeal swab, presence of viral mutation(s) within the areas targeted by this assay, and inadequate number of viral copies(<138 copies/mL). A negative result must be combined with clinical observations, patient history, and epidemiological information. The expected result is Negative.  Fact Sheet for Patients:   EntrepreneurPulse.com.au  Fact Sheet for Healthcare Providers:  IncredibleEmployment.be  This test is no t yet approved or cleared by the [place] FDA and  has been authorized for detection and/or diagnosis of SARS-CoV-2 by FDA under an Emergency Use Authorization (EUA). This EUA will remain  in effect (meaning this test can be used) for the duration of the COVID-19 declaration under Section 564(b)(1) of the Act, 21 U.S.C.section 360bbb-3(b)(1), unless the authorization is terminated  or revoked sooner.       Influenza A by PCR NEGATIVE NEGATIVE Final   Influenza B by PCR NEGATIVE NEGATIVE Final    Comment: (NOTE) The Xpert Xpress SARS-CoV-2/FLU/RSV plus assay is intended as an aid in the diagnosis of influenza from Nasopharyngeal swab specimens and should not be used as a sole basis for treatment. Nasal washings and aspirates are unacceptable for Xpert Xpress SARS-CoV-2/FLU/RSV testing.  Fact Sheet for Patients: EntrepreneurPulse.com.au  Fact Sheet for Healthcare Providers: IncredibleEmployment.be  This test is not yet approved or cleared by the [place] FDA and has been authorized for detection and/or diagnosis of SARS-CoV-2 by FDA under an Emergency Use Authorization (EUA). This EUA will remain in effect (meaning this test can be used) for the duration of the COVID-19 declaration under Section 564(b)(1) of the Act, 21 U.S.C. section 360bbb-3(b)(1), unless the authorization is terminated or revoked.  Performed at [facility], [address] [place], [place] [address]   Urine Culture     Status: Abnormal (Preliminary result)   Collection Time: 09/01/21  9:19 AM   Specimen: Urine, Clean Catch  Result Value Ref Range Status   Specimen Description   Final    URINE, CLEAN CATCH Performed at [facility], [address] [place], [place] [address]    Special Requests    Final    NONE Performed at [facility], [address] [place], [place] [address]    Culture >=100,000 COLONIES/mL ESCHERICHIA COLI (A)  Final   Report Status PENDING  Incomplete    Coagulation Studies: Recent Labs    08/30/21 1949  LABPROT 14.7  INR 1.2     Urinalysis: Recent Labs    09/01/21 0919  COLORURINE RED*  LABSPEC 1.010  PHURINE TEST NOT REPORTED DUE TO COLOR INTERFERENCE OF URINE PIGMENT  GLUCOSEU TEST NOT REPORTED DUE TO COLOR INTERFERENCE OF URINE PIGMENT*  HGBUR TEST NOT REPORTED DUE TO COLOR INTERFERENCE OF URINE PIGMENT*  BILIRUBINUR TEST NOT REPORTED DUE TO COLOR INTERFERENCE OF URINE PIGMENT*  KETONESUR TEST NOT REPORTED DUE TO COLOR INTERFERENCE OF URINE PIGMENT*  PROTEINUR TEST NOT REPORTED DUE TO COLOR INTERFERENCE OF URINE PIGMENT*  NITRITE TEST NOT REPORTED DUE TO COLOR INTERFERENCE OF URINE PIGMENT*  LEUKOCYTESUR TEST NOT REPORTED DUE TO COLOR INTERFERENCE OF URINE PIGMENT*       Imaging: US RENAL  Result Date: 09/01/2021 CLINICAL DATA:  Hematuria. EXAM: RENAL / URINARY TRACT ULTRASOUND COMPLETE COMPARISON:  CT, 05/22/2019. FINDINGS: Right Kidney: Renal measurements: 10.8 x 6.6 x 6.2 cm = volume: 232 mL. Numerous cysts with significant thinning of the renal parenchyma. Midpole cyst, largest, 2.7 x 2.6 x 2.6 cm. No solid masses. No hydronephrosis. Left Kidney:  Renal measurements: 10.7 x 6.1 x 4.1 cm = volume: 140 mL. Numerous cysts with significant thinning of the renal parenchyma. Largest cyst arises from the lower pole, 2.9 x 2.7 x 2.9 cm. No solid masses. No hydronephrosis. Bladder: Appears normal for degree of bladder distention. Other: None. IMPRESSION: 1. Numerous bilateral renal cysts with thinned intervening renal parenchyma, consistent with polycystic kidney disease. 2. No acute finding.  No hydronephrosis. Electronically Signed   By: Lajean Manes M.D.   On: 09/01/2021 15:19   NM Myocar Multi W/Spect W/Wall Motion / EF  Result  Date: 09/02/2021   The study is normal. The study is low risk.   CT attenuation images showed minimal coronary calcifications.   LV perfusion is normal. There is no evidence of ischemia. There is no evidence of infarction.   Left ventricular function is normal. End diastolic cavity size is normal. End systolic cavity size is normal.     Medications:      aspirin EC  81 mg Oral Daily   atorvastatin  80 mg Oral Daily   brimonidine  1 drop Both Eyes BID AC & HS   calcium acetate  667 mg Oral TID WC   carbamazepine  100 mg Oral BID   Chlorhexidine Gluconate Cloth  6 each Topical Q0600   clopidogrel  75 mg Oral Daily   dicyclomine  20 mg Oral TID PC   famotidine  20 mg Oral QHS   fluticasone  1 spray Each Nare Daily   haloperidol  2.5 mg Oral Q M,W,F   latanoprost  1 drop Both Eyes QHS   loratadine  10 mg Oral Daily   metoprolol tartrate  25 mg Oral BID   montelukast  10 mg Oral Daily   pantoprazole  40 mg Oral Q breakfast   polyvinyl alcohol  3 drop Right Eye QID   rOPINIRole  2 mg Oral QHS   timolol  1 drop Both Eyes BID AC & HS   vitamin B-12  1,000 mcg Oral Q breakfast   ziprasidone  20 mg Oral QHS   acetaminophen, ALPRAZolam, diclofenac Sodium, magnesium hydroxide, nitroGLYCERIN, ondansetron (ZOFRAN) IV, traZODone  Assessment/ Plan:  [name] is a 76 y.o.  female a past medical history including hypertension, pancreatitis, GERD, vertigo, and end-stage renal disease on hemodialysis.  Patient presents to the emergency department with complaints of confusion and fatigue while receiving outpatient dialysis treatment.  Patient has been admitted for NSTEMI (non-ST elevated myocardial infarction) ([place]) [I21.4] Altered mental status, unspecified altered mental status type [R41.82]  [facility]/MWF/LUE AVF/65.5 kg  End-stage renal disease on hemodialysis.  Will maintain outpatient schedule during admission.  Renal ultrasound shows numerous bilateral cyst, but no  obstruction.  Next treatment scheduled for Monday  2. Anemia of chronic kidney disease Normocytic Lab Results  Component Value Date   HGB 9.0 (L) 09/02/2021  [place] received outpatient   3. Secondary Hyperparathyroidism: with outpatient labs: phosphorus 3.9, calcium 9.7 on 08/31/21.   Lab Results  Component Value Date   CALCIUM 8.9 08/31/2021   CAION 1.16 08/09/2021   PHOS 4.1 05/23/2019  Calcitriol prescribed outpatient Continue calcium acetate with meals.  4.  NSTEMI with elevated troponin. Heparin drip stopped due to bleeding. Cardiology following. 2D ECHO pending.  VQ scan negative, continuing aspirin, plavix and statins    LOS: 3 [place] 2/13/20232:03 PM

## 2021-09-02 NOTE — TOC Transition Note (Signed)
Transition of Care [facility]) - CM/SW Discharge Note   Patient Details  Name: Joyce [name] MRN: [id number] Date of Birth: March 06, 1946  Transition of Care [facility]) CM/SW Contact:  Alberteen Sam, LCSW Phone Number: 09/02/2021, 1:39 PM   Clinical Narrative:     Patient to discharge back to [facility] ALF today after HD, [facility] has been made aware, they reported no needs. They had their transportation call this CSW, [name] has been provided with RN phone number to call when he's at [facility] main entrance for patient to be wheeled down . [name] with [facility] Transportation will arrive around 5pm.   No further dc needs identified at this time.    Final next level of care: Assisted Living Barriers to Discharge: No Barriers Identified   Patient Goals and CMS Choice Patient states their goals for this hospitalization and ongoing recovery are:: to go home CMS Medicare.gov Compare Post Acute Care list provided to:: Patient Choice offered to / list presented to : Patient  Discharge Placement                       Discharge Plan and Services                                     Social Determinants of Health (SDOH) Interventions     Readmission Risk Interventions Readmission Risk Prevention Plan 05/24/2019  Transportation Screening Complete  PCP or Specialist Appt within 3-5 Days Complete  HRI or [place] Complete  Social Work Consult for [place] Planning/Counseling Not Complete  SW consult not completed comments NA  Palliative Care Screening Not Applicable  Medication Review (RN Transport planner) Referral to Pharmacy  Some recent data might be hidden

## 2021-09-02 NOTE — Progress Notes (Signed)
Progress Note  Patient Name: Joyce [name] Date of Encounter: 09/02/2021  [facility] Cardiologist: Kate Sable, MD   Subjective   Seen during stress test. Mental status back to normal. BP better. No chest pain or SOB.    Inpatient Medications    Scheduled Meds:  aspirin EC  81 mg Oral Daily   atorvastatin  80 mg Oral Daily   brimonidine  1 drop Both Eyes BID AC & HS   calcium acetate  667 mg Oral TID WC   carbamazepine  100 mg Oral BID   Chlorhexidine Gluconate Cloth  6 each Topical Q0600   clopidogrel  75 mg Oral Daily   dicyclomine  20 mg Oral TID PC   famotidine  20 mg Oral QHS   fluticasone  1 spray Each Nare Daily   haloperidol  2.5 mg Oral Q M,W,F   latanoprost  1 drop Both Eyes QHS   loratadine  10 mg Oral Daily   metoprolol tartrate  25 mg Oral BID   montelukast  10 mg Oral Daily   pantoprazole  40 mg Oral Q breakfast   polyvinyl alcohol  3 drop Right Eye QID   rOPINIRole  2 mg Oral QHS   timolol  1 drop Both Eyes BID AC & HS   vitamin B-12  1,000 mcg Oral Q breakfast   ziprasidone  20 mg Oral QHS   Continuous Infusions:  PRN Meds: acetaminophen, ALPRAZolam, diclofenac Sodium, magnesium hydroxide, nitroGLYCERIN, ondansetron (ZOFRAN) IV, technetium tetrofosmin, traZODone   Vital Signs    Vitals:   09/01/21 1950 09/02/21 0000 09/02/21 0400 09/02/21 0807  BP: (!) 148/65 (!) 146/62 (!) 154/71 (!) 145/56  Pulse: 78 93 90 87  Resp: 20 20 20 19   Temp: 98.4 F (36.9 C) 100 F (37.8 C) 99.8 F (37.7 C) 99.2 F (37.3 C)  TempSrc: Axillary  Oral Oral  SpO2: 94% 94% 95% 95%  Weight:      Height:        Intake/Output Summary (Last 24 hours) at 09/02/2021 1009 Last data filed at 09/02/2021 0745 Gross per 24 hour  Intake 614.76 ml  Output 850 ml  Net -235.24 ml   Last 3 Weights 08/30/2021 08/29/2021 08/09/2021  Weight (lbs) 147 lb 11.3 oz 148 lb 147 lb 9.6 oz  Weight (kg) 67 kg 67.132 kg 66.951 kg  Some encounter information is confidential and  restricted. Go to Review Flowsheets activity to see all data.      Telemetry    NSR, HR 80s - Personally Reviewed  ECG    NSR, 78bpm,  - Personally Reviewed  Physical Exam   GEN: No acute distress.   Neck: No JVD Cardiac: RRR, no murmurs, rubs, or gallops.  Respiratory: Clear to auscultation bilaterally. GI: Soft, nontender, non-distended  MS: No edema; No deformity. Neuro:  Nonfocal  Psych: Normal affect   Labs    High Sensitivity Troponin:   Recent Labs  Lab 08/30/21 1514 08/30/21 1741 09/01/21 1258  TROPONINIHS 77* 527* 1,920*     Chemistry Recent Labs  Lab 08/30/21 1514 08/31/21 0532  NA 141 139  K 3.3* 3.8  CL 102 102  CO2 24 27  GLUCOSE 150* 109*  BUN 14 24*  CREATININE 3.31* 4.75*  CALCIUM 8.7* 8.9  PROT 6.5  --   ALBUMIN 3.4*  --   AST 22  --   ALT 12  --   ALKPHOS 71  --   BILITOT 0.5  --  GFRNONAA 14* 9*  ANIONGAP 15 10    Lipids  Recent Labs  Lab 08/31/21 0532  CHOL 176  TRIG 46  HDL 65  LDLCALC 102*  CHOLHDL 2.7    Hematology Recent Labs  Lab 08/31/21 0532 09/01/21 0502 09/02/21 0758  WBC 16.0* 11.5* 13.2*  RBC 3.29* 2.84* 2.83*  HGB 10.5* 9.0* 9.0*  HCT 32.6* 27.8* 27.6*  MCV 99.1 97.9 97.5  MCH 31.9 31.7 31.8  MCHC 32.2 32.4 32.6  RDW 13.6 13.3 13.2  PLT 207 187 196   Thyroid No results for input(s): TSH, FREET4 in the last 168 hours.  BNPNo results for input(s): BNP, PROBNP in the last 168 hours.  DDimer No results for input(s): DDIMER in the last 168 hours.   Radiology    US RENAL  Result Date: 09/01/2021 CLINICAL DATA:  Hematuria. EXAM: RENAL / URINARY TRACT ULTRASOUND COMPLETE COMPARISON:  CT, 05/22/2019. FINDINGS: Right Kidney: Renal measurements: 10.8 x 6.6 x 6.2 cm = volume: 232 mL. Numerous cysts with significant thinning of the renal parenchyma. Midpole cyst, largest, 2.7 x 2.6 x 2.6 cm. No solid masses. No hydronephrosis. Left Kidney: Renal measurements: 10.7 x 6.1 x 4.1 cm = volume: 140 mL. Numerous  cysts with significant thinning of the renal parenchyma. Largest cyst arises from the lower pole, 2.9 x 2.7 x 2.9 cm. No solid masses. No hydronephrosis. Bladder: Appears normal for degree of bladder distention. Other: None. IMPRESSION: 1. Numerous bilateral renal cysts with thinned intervening renal parenchyma, consistent with polycystic kidney disease. 2. No acute finding.  No hydronephrosis. Electronically Signed   By: Lajean Manes M.D.   On: 09/01/2021 15:19   ECHOCARDIOGRAM COMPLETE  Result Date: 08/31/2021    ECHOCARDIOGRAM REPORT   Patient Name:   Joyce [name] [facility] Date of Exam: 08/31/2021 Medical Rec #:  [id number]         Height:       63.0 in Accession #:    [id number]        Weight:       147.7 lb Date of Birth:  02-17-1946          BSA:          1.700 m Patient Age:    76 years          BP:           95/51 mmHg Patient Gender: F                 HR:           75 bpm. Exam Location:  [facility] Procedure: 2D Echo and Strain Analysis Indications:     NSTEMI I21.4  History:         Patient has prior history of Echocardiogram examinations, most                  recent 11/15/2020.  Sonographer:     Kathlen Brunswick RDCS Referring Phys:  [id number] [place] Diagnosing Phys: Skeet Latch MD  Sonographer Comments: Global longitudinal strain was attempted. IMPRESSIONS  1. Left ventricular ejection fraction, by estimation, is 60 to 65%. The left ventricle has normal function. The left ventricle has no regional wall motion abnormalities. There is mild concentric left ventricular hypertrophy. Left ventricular diastolic parameters are indeterminate. The average left ventricular global longitudinal strain is -16.8 %. The global longitudinal strain is normal.  2. Right ventricular systolic function is normal. The right ventricular size is normal. There is mildly elevated pulmonary  artery systolic pressure.  3. Left atrial size was severely dilated.  4. The mitral valve is normal in structure. Mild mitral valve  regurgitation. No evidence of mitral stenosis.  5. Tricuspid valve regurgitation is mild to moderate.  6. The aortic valve is tricuspid. Aortic valve regurgitation is not visualized. No aortic stenosis is present.  7. The inferior vena cava is normal in size with greater than 50% respiratory variability, suggesting right atrial pressure of 3 mmHg. FINDINGS  Left Ventricle: Left ventricular ejection fraction, by estimation, is 60 to 65%. The left ventricle has normal function. The left ventricle has no regional wall motion abnormalities. The average left ventricular global longitudinal strain is -16.8 %. The global longitudinal strain is normal. The left ventricular internal cavity size was normal in size. There is mild concentric left ventricular hypertrophy. Left ventricular diastolic parameters are indeterminate. Indeterminate filling pressures. Right Ventricle: The right ventricular size is normal. No increase in right ventricular wall thickness. Right ventricular systolic function is normal. There is mildly elevated pulmonary artery systolic pressure. The tricuspid regurgitant velocity is 3.20  m/s, and with an assumed right atrial pressure of 3 mmHg, the estimated right ventricular systolic pressure is 20.9 mmHg. Left Atrium: Left atrial size was severely dilated. Right Atrium: Right atrial size was normal in size. Pericardium: There is no evidence of pericardial effusion. Mitral Valve: The mitral valve is normal in structure. Mild mitral valve regurgitation. No evidence of mitral valve stenosis. Tricuspid Valve: The tricuspid valve is normal in structure. Tricuspid valve regurgitation is mild to moderate. No evidence of tricuspid stenosis. Aortic Valve: The aortic valve is tricuspid. Aortic valve regurgitation is not visualized. No aortic stenosis is present. Aortic valve peak gradient measures 13.1 mmHg. Pulmonic Valve: The pulmonic valve was normal in structure. Pulmonic valve regurgitation is trivial. No  evidence of pulmonic stenosis. Aorta: The aortic root is normal in size and structure. Venous: The inferior vena cava is normal in size with greater than 50% respiratory variability, suggesting right atrial pressure of 3 mmHg. IAS/Shunts: No atrial level shunt detected by color flow Doppler.  LEFT VENTRICLE PLAX 2D LVIDd:         4.67 cm     Diastology LVIDs:         2.95 cm     LV e' medial:    8.27 cm/s LV PW:         1.14 cm     LV E/e' medial:  13.9 LV IVS:        1.15 cm     LV e' lateral:   9.90 cm/s LVOT diam:     1.80 cm     LV E/e' lateral: 11.6 LV SV:         81 LV SV Index:   48          2D Longitudinal Strain LVOT Area:     2.54 cm    2D Strain GLS (A2C):   -17.5 %                            2D Strain GLS (A3C):   -17.2 %                            2D Strain GLS (A4C):   -15.6 % LV Volumes (MOD)           2D Strain GLS Avg:     -  16.8 % LV vol d, MOD A2C: 39.4 ml LV vol d, MOD A4C: 63.6 ml LV vol s, MOD A2C: 11.0 ml LV vol s, MOD A4C: 17.9 ml LV SV MOD A2C:     28.4 ml LV SV MOD A4C:     63.6 ml LV SV MOD BP:      36.0 ml RIGHT VENTRICLE RV Basal diam:  2.80 cm RV S prime:     14.90 cm/s TAPSE (M-mode): 2.2 cm LEFT ATRIUM             Index        RIGHT ATRIUM           Index LA diam:        4.40 cm 2.59 cm/m   RA Area:     13.70 cm LA Vol (A2C):   90.3 ml 53.12 ml/m  RA Volume:   34.30 ml  20.18 ml/m LA Vol (A4C):   98.0 ml 57.65 ml/m LA Biplane Vol: 94.3 ml 55.47 ml/m  AORTIC VALVE                 PULMONIC VALVE AV Area (Vmax): 2.35 cm     PV Vmax:       1.25 m/s AV Vmax:        181.00 cm/s  PV Peak grad:  6.2 mmHg AV Peak Grad:   13.1 mmHg LVOT Vmax:      167.00 cm/s LVOT Vmean:     97.400 cm/s LVOT VTI:       0.318 m  AORTA Ao Root diam: 3.00 cm Ao Asc diam:  3.00 cm MITRAL VALVE                TRICUSPID VALVE MV Area (PHT): 2.78 cm     TV Peak grad:   39.5 mmHg MV Decel Time: 273 msec     TV Vmax:        3.14 m/s MV E velocity: 115.00 cm/s  TR Peak grad:   41.0 mmHg MV A velocity: 95.10 cm/s    TR Vmax:        320.00 cm/s MV E/A ratio:  1.21                             SHUNTS                             Systemic VTI:  0.32 m                             Systemic Diam: 1.80 cm Skeet Latch MD Electronically signed by Skeet Latch MD Signature Date/Time: 08/31/2021/2:53:39 PM    Final     Cardiac Studies     Echo 08/31/21:  1. Left ventricular ejection fraction, by estimation, is 60 to 65%. The  left ventricle has normal function. The left ventricle has no regional  wall motion abnormalities. There is mild concentric left ventricular  hypertrophy. Left ventricular diastolic  parameters are indeterminate. The average left ventricular global  longitudinal strain is -16.8 %. The global longitudinal strain is normal.   2. Right ventricular systolic function is normal. The right ventricular  size is normal. There is mildly elevated pulmonary artery systolic  pressure.   3. Left atrial size was severely dilated.   4. The mitral valve is normal in structure.  Mild mitral valve  regurgitation. No evidence of mitral stenosis.   5. Tricuspid valve regurgitation is mild to moderate.   6. The aortic valve is tricuspid. Aortic valve regurgitation is not  visualized. No aortic stenosis is present.   7. The inferior vena cava is normal in size with greater than 50%  respiratory variability, suggesting right atrial pressure of 3 mmHg.   Patient Profile     76 y.o. female  with a hx of ESRD on HD, hypertension, bipolar disorder, pancreatitis, and vertigo who is being seen 09/01/2021 for the evaluation of labile hypertension and altered mental status during dialysis. Cardiology requested to see for elevated troponin.   Assessment & Plan    Labile hypertension Hypertensive urgency - PTA midodrine 10mg  on HD days, says she was given it on the day of admission - BP elevated on admission to SB[facility] in the 170s, now improved - continue Lopressor 25mg  BID - caution with antihypertensives given  h/o hypotension on dialysis days - may need lower dose midodrine on dialysis days  Elevated troponin - HS trop up to 1920 - no chest pain reported - VQ scan negative - no prior cardiac history - suspected mostly demand ischemia given elevated BP in the setting of ESRD on HD - Echo showed LVEF 60-65% - she completed IV heparin x 48 hours - Myoview stress test today - continue Aspirin, statin, plavix, Lopressor  AMS - resolved - unclear etiology, possibly ftom high BP - CT and MRI head negative  ESRD on HD - M,W,F  For questions or updates, please contact [facility] Please consult [email or web address] for contact info under        Signed, Sharlynn Seckinger Ninfa Meeker, PA-C  09/02/2021, 10:09 AM

## 2021-09-02 NOTE — Care Management Important Message (Signed)
Important Message  Patient Details  Name: Joyce [name] MRN: [id number] Date of Birth: 06-11-1946   Medicare Important Message Given:  N/A - LOS <3 / Initial given by admissions     Dannette Barbara 09/02/2021, 8:51 AM

## 2021-09-02 NOTE — Progress Notes (Incomplete)
Progress Note  Patient Name: Joyce [name] Date of Encounter: 09/02/2021  [facility] Cardiologist: Kate Sable, MD   Subjective   Seen during stress test. Mental status back to normal. BP better.   Inpatient Medications    Scheduled Meds:  aspirin EC  81 mg Oral Daily   atorvastatin  80 mg Oral Daily   brimonidine  1 drop Both Eyes BID AC & HS   calcium acetate  667 mg Oral TID WC   carbamazepine  100 mg Oral BID   Chlorhexidine Gluconate Cloth  6 each Topical Q0600   clopidogrel  75 mg Oral Daily   dicyclomine  20 mg Oral TID PC   famotidine  20 mg Oral QHS   fluticasone  1 spray Each Nare Daily   haloperidol  2.5 mg Oral Q M,W,F   latanoprost  1 drop Both Eyes QHS   loratadine  10 mg Oral Daily   metoprolol tartrate  25 mg Oral BID   montelukast  10 mg Oral Daily   pantoprazole  40 mg Oral Q breakfast   polyvinyl alcohol  3 drop Right Eye QID   rOPINIRole  2 mg Oral QHS   timolol  1 drop Both Eyes BID AC & HS   vitamin B-12  1,000 mcg Oral Q breakfast   ziprasidone  20 mg Oral QHS   Continuous Infusions:  PRN Meds: acetaminophen, ALPRAZolam, diclofenac Sodium, magnesium hydroxide, nitroGLYCERIN, ondansetron (ZOFRAN) IV, technetium tetrofosmin, traZODone   Vital Signs    Vitals:   09/01/21 1950 09/02/21 0000 09/02/21 0400 09/02/21 0807  BP: (!) 148/65 (!) 146/62 (!) 154/71 (!) 145/56  Pulse: 78 93 90 87  Resp: 20 20 20 19   Temp: 98.4 F (36.9 C) 100 F (37.8 C) 99.8 F (37.7 C) 99.2 F (37.3 C)  TempSrc: Axillary  Oral Oral  SpO2: 94% 94% 95% 95%  Weight:      Height:        Intake/Output Summary (Last 24 hours) at 09/02/2021 1009 Last data filed at 09/02/2021 0745 Gross per 24 hour  Intake 614.76 ml  Output 850 ml  Net -235.24 ml   Last 3 Weights 08/30/2021 08/29/2021 08/09/2021  Weight (lbs) 147 lb 11.3 oz 148 lb 147 lb 9.6 oz  Weight (kg) 67 kg 67.132 kg 66.951 kg  Some encounter information is confidential and restricted. Go to  Review Flowsheets activity to see all data.      Telemetry    *** - Personally Reviewed  ECG    *** - Personally Reviewed  Physical Exam  *** GEN: No acute distress.   Neck: No JVD Cardiac: RRR, no murmurs, rubs, or gallops.  Respiratory: Clear to auscultation bilaterally. GI: Soft, nontender, non-distended  MS: No edema; No deformity. Neuro:  Nonfocal  Psych: Normal affect   Labs    High Sensitivity Troponin:   Recent Labs  Lab 08/30/21 1514 08/30/21 1741 09/01/21 1258  TROPONINIHS 77* 527* 1,920*     Chemistry Recent Labs  Lab 08/30/21 1514 08/31/21 0532  NA 141 139  K 3.3* 3.8  CL 102 102  CO2 24 27  GLUCOSE 150* 109*  BUN 14 24*  CREATININE 3.31* 4.75*  CALCIUM 8.7* 8.9  PROT 6.5  --   ALBUMIN 3.4*  --   AST 22  --   ALT 12  --   ALKPHOS 71  --   BILITOT 0.5  --   GFRNONAA 14* 9*  ANIONGAP 15 10  Lipids  Recent Labs  Lab 08/31/21 0532  CHOL 176  TRIG 46  HDL 65  LDLCALC 102*  CHOLHDL 2.7    Hematology Recent Labs  Lab 08/31/21 0532 09/01/21 0502 09/02/21 0758  WBC 16.0* 11.5* 13.2*  RBC 3.29* 2.84* 2.83*  HGB 10.5* 9.0* 9.0*  HCT 32.6* 27.8* 27.6*  MCV 99.1 97.9 97.5  MCH 31.9 31.7 31.8  MCHC 32.2 32.4 32.6  RDW 13.6 13.3 13.2  PLT 207 187 196   Thyroid No results for input(s): TSH, FREET4 in the last 168 hours.  BNPNo results for input(s): BNP, PROBNP in the last 168 hours.  DDimer No results for input(s): DDIMER in the last 168 hours.   Radiology    US RENAL  Result Date: 09/01/2021 CLINICAL DATA:  Hematuria. EXAM: RENAL / URINARY TRACT ULTRASOUND COMPLETE COMPARISON:  CT, 05/22/2019. FINDINGS: Right Kidney: Renal measurements: 10.8 x 6.6 x 6.2 cm = volume: 232 mL. Numerous cysts with significant thinning of the renal parenchyma. Midpole cyst, largest, 2.7 x 2.6 x 2.6 cm. No solid masses. No hydronephrosis. Left Kidney: Renal measurements: 10.7 x 6.1 x 4.1 cm = volume: 140 mL. Numerous cysts with significant thinning of  the renal parenchyma. Largest cyst arises from the lower pole, 2.9 x 2.7 x 2.9 cm. No solid masses. No hydronephrosis. Bladder: Appears normal for degree of bladder distention. Other: None. IMPRESSION: 1. Numerous bilateral renal cysts with thinned intervening renal parenchyma, consistent with polycystic kidney disease. 2. No acute finding.  No hydronephrosis. Electronically Signed   By: Lajean Manes M.D.   On: 09/01/2021 15:19   ECHOCARDIOGRAM COMPLETE  Result Date: 08/31/2021    ECHOCARDIOGRAM REPORT   [name] Date [facility] Exam: 08/31/2021 Medical Rec #:  [id number]         Height:       63.0 in Accession #:    [id number]        Weight:       147.7 lb Date of Birth:  1946/03/25          BSA:          1.700 m Patient Age:    76 years          BP:           95/51 mmHg Patient Gender: F                 HR:           75 bpm. Exam Location:  [facility] Procedure: 2D Echo and Strain Analysis Indications:     NSTEMI I21.4  History:         Patient has prior history of Echocardiogram examinations, most                  recent 11/15/2020.  Sonographer:     Kathlen Brunswick RDCS Referring Phys:  [id number] [place] Diagnosing Phys: Skeet Latch MD  Sonographer Comments: Global longitudinal strain was attempted. IMPRESSIONS  1. Left ventricular ejection fraction, by estimation, is 60 to 65%. The left ventricle has normal function. The left ventricle has no regional wall motion abnormalities. There is mild concentric left ventricular hypertrophy. Left ventricular diastolic parameters are indeterminate. The average left ventricular global longitudinal strain is -16.8 %. The global longitudinal strain is normal.  2. Right ventricular systolic function is normal. The right ventricular size is normal. There is mildly elevated pulmonary artery systolic pressure.  3. Left atrial size was severely  dilated.  4. The mitral valve is normal in structure. Mild mitral valve regurgitation. No evidence of mitral  stenosis.  5. Tricuspid valve regurgitation is mild to moderate.  6. The aortic valve is tricuspid. Aortic valve regurgitation is not visualized. No aortic stenosis is present.  7. The inferior vena cava is normal in size with greater than 50% respiratory variability, suggesting right atrial pressure of 3 mmHg. FINDINGS  Left Ventricle: Left ventricular ejection fraction, by estimation, is 60 to 65%. The left ventricle has normal function. The left ventricle has no regional wall motion abnormalities. The average left ventricular global longitudinal strain is -16.8 %. The global longitudinal strain is normal. The left ventricular internal cavity size was normal in size. There is mild concentric left ventricular hypertrophy. Left ventricular diastolic parameters are indeterminate. Indeterminate filling pressures. Right Ventricle: The right ventricular size is normal. No increase in right ventricular wall thickness. Right ventricular systolic function is normal. There is mildly elevated pulmonary artery systolic pressure. The tricuspid regurgitant velocity is 3.20  m/s, and with an assumed right atrial pressure of 3 mmHg, the estimated right ventricular systolic pressure is 09.7 mmHg. Left Atrium: Left atrial size was severely dilated. Right Atrium: Right atrial size was normal in size. Pericardium: There is no evidence of pericardial effusion. Mitral Valve: The mitral valve is normal in structure. Mild mitral valve regurgitation. No evidence of mitral valve stenosis. Tricuspid Valve: The tricuspid valve is normal in structure. Tricuspid valve regurgitation is mild to moderate. No evidence of tricuspid stenosis. Aortic Valve: The aortic valve is tricuspid. Aortic valve regurgitation is not visualized. No aortic stenosis is present. Aortic valve peak gradient measures 13.1 mmHg. Pulmonic Valve: The pulmonic valve was normal in structure. Pulmonic valve regurgitation is trivial. No evidence of pulmonic stenosis. Aorta:  The aortic root is normal in size and structure. Venous: The inferior vena cava is normal in size with greater than 50% respiratory variability, suggesting right atrial pressure of 3 mmHg. IAS/Shunts: No atrial level shunt detected by color flow Doppler.  LEFT VENTRICLE PLAX 2D LVIDd:         4.67 cm     Diastology LVIDs:         2.95 cm     LV e' medial:    8.27 cm/s LV PW:         1.14 cm     LV E/e' medial:  13.9 LV IVS:        1.15 cm     LV e' lateral:   9.90 cm/s LVOT diam:     1.80 cm     LV E/e' lateral: 11.6 LV SV:         81 LV SV Index:   48          2D Longitudinal Strain LVOT Area:     2.54 cm    2D Strain GLS (A2C):   -17.5 %                            2D Strain GLS (A3C):   -17.2 %                            2D Strain GLS (A4C):   -15.6 % LV Volumes (MOD)           2D Strain GLS Avg:     -16.8 % LV vol d, MOD A2C: 39.4 ml  LV vol d, MOD A4C: 63.6 ml LV vol s, MOD A2C: 11.0 ml LV vol s, MOD A4C: 17.9 ml LV SV MOD A2C:     28.4 ml LV SV MOD A4C:     63.6 ml LV SV MOD BP:      36.0 ml RIGHT VENTRICLE RV Basal diam:  2.80 cm RV S prime:     14.90 cm/s TAPSE (M-mode): 2.2 cm LEFT ATRIUM             Index        RIGHT ATRIUM           Index LA diam:        4.40 cm 2.59 cm/m   RA Area:     13.70 cm LA Vol (A2C):   90.3 ml 53.12 ml/m  RA Volume:   34.30 ml  20.18 ml/m LA Vol (A4C):   98.0 ml 57.65 ml/m LA Biplane Vol: 94.3 ml 55.47 ml/m  AORTIC VALVE                 PULMONIC VALVE AV Area (Vmax): 2.35 cm     PV Vmax:       1.25 m/s AV Vmax:        181.00 cm/s  PV Peak grad:  6.2 mmHg AV Peak Grad:   13.1 mmHg LVOT Vmax:      167.00 cm/s LVOT Vmean:     97.400 cm/s LVOT VTI:       0.318 m  AORTA Ao Root diam: 3.00 cm Ao Asc diam:  3.00 cm MITRAL VALVE                TRICUSPID VALVE MV Area (PHT): 2.78 cm     TV Peak grad:   39.5 mmHg MV Decel Time: 273 msec     TV Vmax:        3.14 m/s MV E velocity: 115.00 cm/s  TR Peak grad:   41.0 mmHg MV A velocity: 95.10 cm/s   TR Vmax:        320.00 cm/s MV E/A  ratio:  1.21                             SHUNTS                             Systemic VTI:  0.32 m                             Systemic Diam: 1.80 cm Skeet Latch MD Electronically signed by Skeet Latch MD Signature Date/Time: 08/31/2021/2:53:39 PM    Final     Cardiac Studies     Echo 08/31/21:  1. Left ventricular ejection fraction, by estimation, is 60 to 65%. The  left ventricle has normal function. The left ventricle has no regional  wall motion abnormalities. There is mild concentric left ventricular  hypertrophy. Left ventricular diastolic  parameters are indeterminate. The average left ventricular global  longitudinal strain is -16.8 %. The global longitudinal strain is normal.   2. Right ventricular systolic function is normal. The right ventricular  size is normal. There is mildly elevated pulmonary artery systolic  pressure.   3. Left atrial size was severely dilated.   4. The mitral valve is normal in structure. Mild mitral valve  regurgitation. No evidence of mitral  stenosis.   5. Tricuspid valve regurgitation is mild to moderate.   6. The aortic valve is tricuspid. Aortic valve regurgitation is not  visualized. No aortic stenosis is present.   7. The inferior vena cava is normal in size with greater than 50%  respiratory variability, suggesting right atrial pressure of 3 mmHg.   Patient Profile     76 y.o. female  with a hx of ESRD on HD, hypertension, bipolar disorder, pancreatitis, and vertigo who is being seen 09/01/2021 for the evaluation of labile hypertension and altered mental status during dialysis. Cardiology requested to see for elevated troponin.   Assessment & Plan    Labile hypertension Hypertensive urgency - PTA midodrine 5mg  on HD days - elevated on admission to SBPs in the 170s - Lopressor 25mg  BID  Elevated troponin - HS trop up to 1920 - no chest pain reported - VQ scan negative - no prior cardiac history - suspected mostly demand ischemia  given elevated BP in the setting of ESRD on HD - Echo showed LVEF 60-65%,  - Myoview stress test today - continue Aspirin, statin, plavix, Lopressor  AMS - resolved - unclear etiology, possible ftom high BP - CT and MRI head negative  ESRD on HD - M,W,F  For questions or updates, please contact [facility] Please consult [email or web address] for contact info under        Signed, Eston Heslin Ninfa Meeker, PA-C  09/02/2021, 10:09 AM

## 2021-09-02 NOTE — Discharge Summary (Signed)
Physician Discharge Summary   Patient: Joyce [name] MRN: [id number] DOB: 03-21-1946  Admit date:     08/30/2021  Discharge date: 09/02/21  Discharge Physician: Fritzi Mandes   PCP: [facility]   Recommendations at discharge:    F/u cardiology Dr Garen Lah in 2-3 weeks Resume your HD MWF as before  Discharge Diagnoses:  hypertensive urgency resolved elevated troponin likely demand ischemia end-stage renal disease on HD   Hospital Course:  Acute metabolic encephalopathy/altered mental status etiology unclear resolved -- could be due to high blood pressure but now stable -- CT had an MRI brain nothing acute -- patient alert and oriented times three   elevated troponin without chest pain and no acute EKG changes -- suspect could be due to elevated blood pressure and demand ischemia -- no cardiac history per patient -- patient denies chest pain -- VQ scan negative -- [facility] cardiology consulted-- discussed with Dr. Oval Linsey. -- Patient currently on IV heparin drip for 48 hours--now d/ced -- continue aspirin and statins.  -- Echo EF 60-65%. Mildly elevated right atrial pressure. Increased size of LA -- patient's nuclear stress test per Dr. Fletcher Anon was negative.    Hypertensive urgency--transient --resolved --no spikes of BP noted.  --cont BB (per cardiology rec)   end-stage renal disease on hemodialysis -- will continue in-house dialysis   bipolar disorder -- continue home meds   patient ambulates by herself using her cane at [facility].   Anticipate discharge today after dialysis to [facility]          Consultants: [facility] cardiology Procedures performed: nuclear stress test Disposition: Assisted living Diet recommendation:  Discharge Diet Orders (From admission, onward)     Start     Ordered   09/02/21 0000  Diet - low sodium heart healthy        09/02/21 1443           Cardiac diet  DISCHARGE MEDICATION: Allergies as of  09/02/2021       Reactions   Morphine And Related Shortness Of Breath   Pt reports chest pain and difficulty breathing.   Gabapentin    PER MAR   Indomethacin Hives   Penicillins Other (See Comments)   PER MAR   Pollen Extract Other (See Comments)   Sinus problems and HA        Medication List     TAKE these medications    acetaminophen 500 MG tablet Commonly known as: TYLENOL Take 1,000 mg by mouth daily as needed for moderate pain.   brimonidine 0.2 % ophthalmic solution Commonly known as: ALPHAGAN Place 1 drop into both eyes 2 (two) times daily. What changed: additional instructions   calcium acetate 667 MG capsule Commonly known as: PHOSLO Take 667 mg by mouth 3 (three) times daily with meals.   carbamazepine 100 MG chewable tablet Commonly known as: TEGRETOL Chew 100 mg by mouth 2 (two) times daily. (0600 & 2100)   Carboxymethylcellulose Sod PF 1 % Gel Place 3 drops into the right eye 4 (four) times daily.   Dexlansoprazole 30 MG capsule DR Take 30 mg by mouth daily. (0630)   diclofenac Sodium 1 % Gel Commonly known as: VOLTAREN Apply 2 g topically 3 (three) times daily as needed (pain).   dicyclomine 20 MG tablet Commonly known as: BENTYL Take 20 mg by mouth 3 (three) times daily after meals.   famotidine 40 MG tablet Commonly known as: PEPCID Take 40 mg by mouth at bedtime. (  2000)   fluticasone 50 MCG/ACT nasal spray Commonly known as: FLONASE Place 1 spray into both nostrils in the morning. (0700)   haloperidol 5 MG tablet Commonly known as: HALDOL TAKE 1/2 TABLET THREE TIMES WEEKLY ON MONDAY, WEDNESDAY AND FRIDAY  MORNING   lidocaine-prilocaine cream Commonly known as: EMLA APPLY TO ACCESS AREA 30 MINUTES BEFORE DIALYSIS MONDAY / WEDNESDAY / FRIDAY   loratadine 10 MG tablet Commonly known as: CLARITIN Take 10 mg by mouth daily.   metoprolol tartrate 25 MG tablet Commonly known as: LOPRESSOR Take 1 tablet (25 mg total) by mouth 2 (two)  times daily.   montelukast 10 MG tablet Commonly known as: SINGULAIR Take 10 mg by mouth daily.   nitroGLYCERIN 0.4 MG SL tablet Commonly known as: NITROSTAT Place 1 tablet (0.4 mg total) under the tongue every 5 (five) minutes x 3 doses as needed for chest pain.   ondansetron 4 MG tablet Commonly known as: ZOFRAN Take 4 mg by mouth every 6 (six) hours as needed for nausea or vomiting.   Rocklatan 0.02-0.005 % Soln Generic drug: Netarsudil-Latanoprost INSTILL 1 DROP INTO EACH EYE AT BEDTIME   rOPINIRole 2 MG tablet Commonly known as: REQUIP Take 2 mg by mouth at bedtime.   timolol 0.5 % ophthalmic solution Commonly known as: TIMOPTIC Place 1 drop into both eyes 2 (two) times daily.   traMADol 50 MG tablet Commonly known as: Ultram Take 1 tablet (50 mg total) by mouth every 6 (six) hours as needed.   traZODone 50 MG tablet Commonly known as: DESYREL Take 25 mg by mouth at bedtime.   vitamin B-12 1000 MCG tablet Commonly known as: CYANOCOBALAMIN Take 1,000 mcg by mouth in the morning. (0600)   ziprasidone 20 MG capsule Commonly known as: GEODON Take 20 mg by mouth at bedtime. (2100)        Follow-up Information     [facility] Schedule an appointment as soon as possible for a visit in 1 week(s).   Specialty: Geriatric Medicine Why: hosp f/u Contact information: [address] [place] [address] [phone]         Kate Sable, MD .   Specialties: Cardiology, Radiology Contact information: [place]  [phone]                 Discharge Exam: Danley Danker Weights   08/30/21 1509 09/02/21 1203  Weight: 67 kg 65.4 kg     Condition at discharge: fair  The results of significant diagnostics from this hospitalization (including imaging, microbiology, ancillary and laboratory) are listed below for reference.   Imaging Studies: CT HEAD WO CONTRAST (5MM)  Result Date:  08/30/2021 CLINICAL DATA:  Mental status change, unknown cause.  Weakness. EXAM: CT HEAD WITHOUT CONTRAST TECHNIQUE: Contiguous axial images were obtained from the base of the skull through the vertex without intravenous contrast. RADIATION DOSE REDUCTION: This exam was performed according to the departmental dose-optimization program which includes automated exposure control, adjustment of the mA and/or kV according to patient size and/or use of iterative reconstruction technique. COMPARISON:  CT head 12/14/2020. FINDINGS: Brain: There is no evidence of acute intracranial hemorrhage, mass lesion, brain edema or extra-axial fluid collection. Stable atrophy with prominence of the subarachnoid spaces. No ventriculomegaly. Stable mild chronic small vessel ischemic changes in the periventricular white matter. There is no CT evidence of acute cortical infarction. Vascular:  No hyperdense vessel identified. Skull: Negative for fracture or focal lesion. Sinuses/Orbits: The visualized  paranasal sinuses and mastoid air cells are clear. No orbital abnormalities are seen. Other: None. IMPRESSION: Stable noncontrast head CT.  No acute intracranial findings. Electronically Signed   By: Richardean Sale M.D.   On: 08/30/2021 15:44   MR BRAIN WO CONTRAST  Result Date: 08/30/2021 CLINICAL DATA:  Initial evaluation for acute mental status change. EXAM: MRI HEAD WITHOUT CONTRAST TECHNIQUE: Multiplanar, multiecho pulse sequences of the brain and surrounding structures were obtained without intravenous contrast. COMPARISON:  Prior head CT from earlier the same day. FINDINGS: Brain: Mildly advanced cerebral atrophy. Few scattered patchy foci of T2/FLAIR hyperintensity noted involving the supratentorial cerebral white matter, nonspecific, but overall mild for age, and felt to be within normal limits. No abnormal foci of restricted diffusion to suggest acute or subacute ischemia. Gray-white matter differentiation maintained. No areas  of chronic cortical infarction. No acute or chronic intracranial hemorrhage. No mass lesion, midline shift or mass effect no hydrocephalus or extra-axial fluid collection. Pituitary gland suprasellar region normal. Vascular: Major intracranial vascular flow voids are maintained. Skull and upper cervical spine: Craniocervical junction within normal limits. Bone marrow signal intensity normal. No scalp soft tissue abnormality. Sinuses/Orbits: Prior bilateral ocular lens replacement. Mild scattered mucosal thickening noted throughout the paranasal sinuses. No significant mastoid effusion. Inner ear structures grossly normal. Other: None. IMPRESSION: 1. No acute intracranial abnormality. 2. Mildly advanced cerebral atrophy. Electronically Signed   By: Jeannine Boga M.D.   On: 08/30/2021 21:13   NM Pulmonary Perfusion  Result Date: 08/31/2021 CLINICAL DATA:  Altered mental status. Dialysis patient. Unable to obtain additional information. EXAM: NUCLEAR MEDICINE PERFUSION LUNG SCAN TECHNIQUE: Perfusion images were obtained in multiple projections after intravenous injection of radiopharmaceutical. Ventilation scans intentionally deferred if perfusion scan and chest x-ray adequate for interpretation during COVID 19 epidemic. RADIOPHARMACEUTICALS:  4.2 mCi Tc-59m MAA IV COMPARISON:  Current chest radiograph FINDINGS: Normal perfusion. There are no segmental areas decreased perfusion to suggest pulmonary thromboembolism. IMPRESSION: Negative exam.  No evidence of pulmonary thromboembolism. Electronically Signed   By: Lajean Manes M.D.   On: 08/31/2021 10:13   US RENAL  Result Date: 09/01/2021 CLINICAL DATA:  Hematuria. EXAM: RENAL / URINARY TRACT ULTRASOUND COMPLETE COMPARISON:  CT, 05/22/2019. FINDINGS: Right Kidney: Renal measurements: 10.8 x 6.6 x 6.2 cm = volume: 232 mL. Numerous cysts with significant thinning of the renal parenchyma. Midpole cyst, largest, 2.7 x 2.6 x 2.6 cm. No solid masses. No  hydronephrosis. Left Kidney: Renal measurements: 10.7 x 6.1 x 4.1 cm = volume: 140 mL. Numerous cysts with significant thinning of the renal parenchyma. Largest cyst arises from the lower pole, 2.9 x 2.7 x 2.9 cm. No solid masses. No hydronephrosis. Bladder: Appears normal for degree of bladder distention. Other: None. IMPRESSION: 1. Numerous bilateral renal cysts with thinned intervening renal parenchyma, consistent with polycystic kidney disease. 2. No acute finding.  No hydronephrosis. Electronically Signed   By: Lajean Manes M.D.   On: 09/01/2021 15:19   NM Myocar Multi W/Spect W/Wall Motion / EF  Result Date: 09/02/2021   The study is normal. The study is low risk.   CT attenuation images showed minimal coronary calcifications.   LV perfusion is normal. There is no evidence of ischemia. There is no evidence of infarction.   Left ventricular function is normal. End diastolic cavity size is normal. End systolic cavity size is normal.   DG Chest Portable 1 View  Result Date: 08/31/2021 CLINICAL DATA:  Altered mental status.  Dialysis patient. EXAM: PORTABLE  CHEST 1 VIEW COMPARISON:  05/01/2021. FINDINGS: Cardiac silhouette is top-normal in size. No mediastinal or hilar masses. There are linear opacities at the right lung base consistent with atelectasis. Remainder of the lungs is clear. No convincing pleural effusion and no pneumothorax. Right posterolateral seventh rib fracture, which is of unclear chronicity, not evident on the most recent prior radiographs. Left subclavian to upper arm vascular stent is stable. IMPRESSION: 1. Right posterolateral seventh rib fracture of unclear chronicity, suspected to be subacute. No pneumothorax. 2. Mild linear right lung base atelectasis accentuated by low lung volumes. No convincing acute cardiopulmonary disease. Electronically Signed   By: Lajean Manes M.D.   On: 08/31/2021 10:21   DG C-Arm 1-60 Min-No Report  Result Date: 08/09/2021 Fluoroscopy was utilized  by the requesting physician.  No radiographic interpretation.   ECHOCARDIOGRAM COMPLETE  Result Date: 08/31/2021    ECHOCARDIOGRAM REPORT   Patient Name:   Joyce [name] [facility] Date of Exam: 08/31/2021 Medical Rec #:  [id number]         Height:       63.0 in Accession #:    [id number]        Weight:       147.7 lb Date of Birth:  1945/08/24          BSA:          1.700 m Patient Age:    30 years          BP:           95/51 mmHg Patient Gender: F                 HR:           75 bpm. Exam Location:  [facility] Procedure: 2D Echo and Strain Analysis Indications:     NSTEMI I21.4  History:         Patient has prior history of Echocardiogram examinations, most                  recent 11/15/2020.  Sonographer:     Kathlen Brunswick RDCS Referring Phys:  [id number] [place] Diagnosing Phys: Skeet Latch MD  Sonographer Comments: Global longitudinal strain was attempted. IMPRESSIONS  1. Left ventricular ejection fraction, by estimation, is 60 to 65%. The left ventricle has normal function. The left ventricle has no regional wall motion abnormalities. There is mild concentric left ventricular hypertrophy. Left ventricular diastolic parameters are indeterminate. The average left ventricular global longitudinal strain is -16.8 %. The global longitudinal strain is normal.  2. Right ventricular systolic function is normal. The right ventricular size is normal. There is mildly elevated pulmonary artery systolic pressure.  3. Left atrial size was severely dilated.  4. The mitral valve is normal in structure. Mild mitral valve regurgitation. No evidence of mitral stenosis.  5. Tricuspid valve regurgitation is mild to moderate.  6. The aortic valve is tricuspid. Aortic valve regurgitation is not visualized. No aortic stenosis is present.  7. The inferior vena cava is normal in size with greater than 50% respiratory variability, suggesting right atrial pressure of 3 mmHg. FINDINGS  Left Ventricle: Left ventricular ejection fraction, by  estimation, is 60 to 65%. The left ventricle has normal function. The left ventricle has no regional wall motion abnormalities. The average left ventricular global longitudinal strain is -16.8 %. The global longitudinal strain is normal. The left ventricular internal cavity size was normal in size. There is mild concentric left ventricular hypertrophy. Left ventricular  diastolic parameters are indeterminate. Indeterminate filling pressures. Right Ventricle: The right ventricular size is normal. No increase in right ventricular wall thickness. Right ventricular systolic function is normal. There is mildly elevated pulmonary artery systolic pressure. The tricuspid regurgitant velocity is 3.20  m/s, and with an assumed right atrial pressure of 3 mmHg, the estimated right ventricular systolic pressure is 43.1 mmHg. Left Atrium: Left atrial size was severely dilated. Right Atrium: Right atrial size was normal in size. Pericardium: There is no evidence of pericardial effusion. Mitral Valve: The mitral valve is normal in structure. Mild mitral valve regurgitation. No evidence of mitral valve stenosis. Tricuspid Valve: The tricuspid valve is normal in structure. Tricuspid valve regurgitation is mild to moderate. No evidence of tricuspid stenosis. Aortic Valve: The aortic valve is tricuspid. Aortic valve regurgitation is not visualized. No aortic stenosis is present. Aortic valve peak gradient measures 13.1 mmHg. Pulmonic Valve: The pulmonic valve was normal in structure. Pulmonic valve regurgitation is trivial. No evidence of pulmonic stenosis. Aorta: The aortic root is normal in size and structure. Venous: The inferior vena cava is normal in size with greater than 50% respiratory variability, suggesting right atrial pressure of 3 mmHg. IAS/Shunts: No atrial level shunt detected by color flow Doppler.  LEFT VENTRICLE PLAX 2D LVIDd:         4.67 cm     Diastology LVIDs:         2.95 cm     LV e' medial:    8.27 cm/s LV PW:          1.14 cm     LV E/e' medial:  13.9 LV IVS:        1.15 cm     LV e' lateral:   9.90 cm/s LVOT diam:     1.80 cm     LV E/e' lateral: 11.6 LV SV:         81 LV SV Index:   48          2D Longitudinal Strain LVOT Area:     2.54 cm    2D Strain GLS (A2C):   -17.5 %                            2D Strain GLS (A3C):   -17.2 %                            2D Strain GLS (A4C):   -15.6 % LV Volumes (MOD)           2D Strain GLS Avg:     -16.8 % LV vol d, MOD A2C: 39.4 ml LV vol d, MOD A4C: 63.6 ml LV vol s, MOD A2C: 11.0 ml LV vol s, MOD A4C: 17.9 ml LV SV MOD A2C:     28.4 ml LV SV MOD A4C:     63.6 ml LV SV MOD BP:      36.0 ml RIGHT VENTRICLE RV Basal diam:  2.80 cm RV S prime:     14.90 cm/s TAPSE (M-mode): 2.2 cm LEFT ATRIUM             Index        RIGHT ATRIUM           Index LA diam:        4.40 cm 2.59 cm/m   RA Area:     13.70 cm LA Vol (A2C):  90.3 ml 53.12 ml/m  RA Volume:   34.30 ml  20.18 ml/m LA Vol (A4C):   98.0 ml 57.65 ml/m LA Biplane Vol: 94.3 ml 55.47 ml/m  AORTIC VALVE                 PULMONIC VALVE AV Area (Vmax): 2.35 cm     PV Vmax:       1.25 m/s AV Vmax:        181.00 cm/s  PV Peak grad:  6.2 mmHg AV Peak Grad:   13.1 mmHg LVOT Vmax:      167.00 cm/s LVOT Vmean:     97.400 cm/s LVOT VTI:       0.318 m  AORTA Ao Root diam: 3.00 cm Ao Asc diam:  3.00 cm MITRAL VALVE                TRICUSPID VALVE MV Area (PHT): 2.78 cm     TV Peak grad:   39.5 mmHg MV Decel Time: 273 msec     TV Vmax:        3.14 m/s MV E velocity: 115.00 cm/s  TR Peak grad:   41.0 mmHg MV A velocity: 95.10 cm/s   TR Vmax:        320.00 cm/s MV E/A ratio:  1.21                             SHUNTS                             Systemic VTI:  0.32 m                             Systemic Diam: 1.80 cm Skeet Latch MD Electronically signed by Skeet Latch MD Signature Date/Time: 08/31/2021/2:53:39 PM    Final     Microbiology: Results for orders placed or performed during the hospital encounter of 08/30/21  Resp Panel  by RT-PCR (Flu A&B, Covid) Nasopharyngeal Swab     Status: None   Collection Time: 08/30/21  3:14 PM   Specimen: Nasopharyngeal Swab; Nasopharyngeal(NP) swabs in vial transport medium  Result Value Ref Range Status   SARS Coronavirus 2 by RT PCR NEGATIVE NEGATIVE Final    Comment: (NOTE) SARS-CoV-2 target nucleic acids are NOT DETECTED.  The SARS-CoV-2 RNA is generally detectable in upper respiratory specimens during the acute phase of infection. The lowest concentration of SARS-CoV-2 viral copies this assay can detect is 138 copies/mL. A negative result does not preclude SARS-Cov-2 infection and should not be used as the sole basis for treatment or other patient management decisions. A negative result may occur with  improper specimen collection/handling, submission of specimen other than nasopharyngeal swab, presence of viral mutation(s) within the areas targeted by this assay, and inadequate number of viral copies(<138 copies/mL). A negative result must be combined with clinical observations, patient history, and epidemiological information. The expected result is Negative.  Fact Sheet for Patients:  EntrepreneurPulse.com.au  Fact Sheet for Healthcare Providers:  IncredibleEmployment.be  This test is no t yet approved or cleared by the [place] FDA and  has been authorized for detection and/or diagnosis of SARS-CoV-2 by FDA under an Emergency Use Authorization (EUA). This EUA will remain  in effect (meaning this test can be used) for the duration of the COVID-19 declaration under Section 564(b)(1)  of the Act, 21 U.S.C.section 360bbb-3(b)(1), unless the authorization is terminated  or revoked sooner.       Influenza A by PCR NEGATIVE NEGATIVE Final   Influenza B by PCR NEGATIVE NEGATIVE Final    Comment: (NOTE) The Xpert Xpress SARS-CoV-2/FLU/RSV plus assay is intended as an aid in the diagnosis of influenza from Nasopharyngeal swab  specimens and should not be used as a sole basis for treatment. Nasal washings and aspirates are unacceptable for Xpert Xpress SARS-CoV-2/FLU/RSV testing.  Fact Sheet for Patients: EntrepreneurPulse.com.au  Fact Sheet for Healthcare Providers: IncredibleEmployment.be  This test is not yet approved or cleared by the [place] FDA and has been authorized for detection and/or diagnosis of SARS-CoV-2 by FDA under an Emergency Use Authorization (EUA). This EUA will remain in effect (meaning this test can be used) for the duration of the COVID-19 declaration under Section 564(b)(1) of the Act, 21 U.S.C. section 360bbb-3(b)(1), unless the authorization is terminated or revoked.  Performed at [facility], [address] [place], [place] [address]   Urine Culture     Status: Abnormal (Preliminary result)   Collection Time: 09/01/21  9:19 AM   Specimen: Urine, Clean Catch  Result Value Ref Range Status   Specimen Description   Final    URINE, CLEAN CATCH Performed at [facility], [address] [place], [place] [address]    Special Requests   Final    NONE Performed at [facility], [place], [place] [address]    Culture >=100,000 COLONIES/mL ESCHERICHIA COLI (A)  Final   Report Status PENDING  Incomplete    Labs: CBC: Recent Labs  Lab 08/30/21 1514 08/31/21 0532 09/01/21 0502 09/02/21 0758  WBC 14.7* 16.0* 11.5* 13.2*  HGB 10.6* 10.5* 9.0* 9.0*  HCT 31.9* 32.6* 27.8* 27.6*  MCV 98.5 99.1 97.9 97.5  PLT 184 207 187 076   Basic Metabolic Panel: Recent Labs  Lab 08/30/21 1514 08/31/21 0532  NA 141 139  K 3.3* 3.8  CL 102 102  CO2 24 27  GLUCOSE 150* 109*  BUN 14 24*  CREATININE 3.31* 4.75*  CALCIUM 8.7* 8.9   Liver Function Tests: Recent Labs  Lab 08/30/21 1514  AST 22  ALT 12  ALKPHOS 71  BILITOT 0.5  PROT 6.5  ALBUMIN 3.4*   CBG: No results for input(s): GLUCAP  in the last 168 hours.  Discharge time spent: greater than 30 minutes.  Signed: Fritzi Mandes, MD [facility] 09/02/2021

## 2021-09-02 NOTE — Discharge Instructions (Signed)
Resume your hemodialysis Monday Wednesday Friday as per your schedule

## 2021-09-02 NOTE — Consult Note (Signed)
Stamps for heparin Indication: chest pain/ACS  Allergies  Allergen Reactions   Morphine And Related Shortness Of Breath    Pt reports chest pain and difficulty breathing.   Gabapentin     PER MAR   Indomethacin Hives   Penicillins Other (See Comments)    PER MAR   Pollen Extract Other (See Comments)    Sinus problems and HA    Patient Measurements: Height: 5\' 3"  (160 cm) Weight: 67 kg (147 lb 11.3 oz) IBW/kg (Calculated) : 52.4 Heparin Dosing Weight: 65.9 kg  Vital Signs: Temp: 100 F (37.8 C) (02/13 0000) Temp Source: Axillary (02/12 1950) BP: 146/62 (02/13 0000) Pulse Rate: 93 (02/13 0000)  Labs: Recent Labs    08/30/21 1514 08/30/21 1741 08/30/21 1949 08/31/21 0532 08/31/21 1427 09/01/21 0502 09/01/21 1258 09/01/21 1508 09/02/21 0020  HGB 10.6*  --   --  10.5*  --  9.0*  --   --   --   HCT 31.9*  --   --  32.6*  --  27.8*  --   --   --   PLT 184  --   --  207  --  187  --   --   --   APTT  --   --  31  --   --   --   --   --   --   LABPROT  --   --  14.7  --   --   --   --   --   --   INR  --   --  1.2  --   --   --   --   --   --   HEPARINUNFRC  --   --   --  0.27*   < > 0.34  --  0.24* 0.44  CREATININE 3.31*  --   --  4.75*  --   --   --   --   --   TROPONINIHS 77* 527*  --   --   --   --  1,920*  --   --    < > = values in this interval not displayed.     Estimated Creatinine Clearance: 9.5 mL/min (A) (by C-G formula based on SCr of 4.75 mg/dL (H)).   Medical History: Past Medical History:  Diagnosis Date   Anal fissure    Bipolar affective disorder ([place])    CKD (chronic kidney disease)    Dr Holley Raring [name] 4   Colon polyps    Diverticulitis    Diverticulitis    Family history of adverse reaction to anesthesia    mom - PONV   GERD (gastroesophageal reflux disease)    Headache    migraines - none over 10 yrs   Heart murmur    History of hiatal hernia    Hypertension    Pancreatitis    Valproic  acid   Vertigo     Medications:  No PTA anticoagulation or antiplatelets   Assessment: 76 y.o. female with a past medical history of ESRD on HD presents to the emergency department for confusion, hypertension and tachycardia. Troponin elevated to 527. Pharmacy has been consulted for heparin dosing for ACS.   Baseline labs: Hgb 10.6, plts 184, aPTT and INR ordered  Goal of Therapy:  Heparin level 0.3-0.7 units/ml Monitor platelets by anticoagulation protocol: Yes  2/11 0532 HL 0.27 2/11 2254 HL 0.58, therapeutic x 1 2/12 0502 HL  0.34, therapeutic x 2, trending down fast 2/12 1508 HL 0.24, subtherapeutic 2/13 0020 HL 0.44, therapeutic x 1   Plan:  Will continue heparin infusion at 1200 units/hr Recheck HL 8 hr to confirm, then daily CBC daily while on heparin   Renda Rolls, PharmD, [facility] 09/02/2021 1:04 AM

## 2021-09-03 LAB — URINE CULTURE: Culture: >=100000 — AB

## 2021-09-13 ENCOUNTER — Other Ambulatory Visit: Payer: Self-pay

## 2021-09-13 ENCOUNTER — Emergency Department: Payer: Medicare HMO

## 2021-09-13 ENCOUNTER — Emergency Department
Admission: EM | Admit: 2021-09-13 | Discharge: 2021-09-13 | Disposition: A | Discharge status: Home / Self Care | Payer: Medicare HMO | Attending: Emergency Medicine | Admitting: Emergency Medicine

## 2021-09-13 DIAGNOSIS — R0789 Other chest pain: Secondary | ICD-10-CM | POA: Insufficient documentation

## 2021-09-13 DIAGNOSIS — Z992 Dependence on renal dialysis: Secondary | ICD-10-CM | POA: Insufficient documentation

## 2021-09-13 DIAGNOSIS — R197 Diarrhea, unspecified: Secondary | ICD-10-CM | POA: Insufficient documentation

## 2021-09-13 LAB — BASIC METABOLIC PANEL
Anion gap: 12 (ref 5–15)
BUN: 8 mg/dL (ref 8–23)
CO2: 27 mmol/L (ref 22–32)
Calcium: 8.7 mg/dL — ABNORMAL LOW (ref 8.9–10.3)
Chloride: 98 mmol/L (ref 98–111)
Creatinine, Ser: 2.9 mg/dL — ABNORMAL HIGH (ref 0.44–1.00)
GFR, Estimated: 16 mL/min — ABNORMAL LOW (ref >60)
Glucose, Bld: 100 mg/dL — ABNORMAL HIGH (ref 70–99)
Potassium: 3.1 mmol/L — ABNORMAL LOW (ref 3.5–5.1)
Sodium: 137 mmol/L (ref 135–145)

## 2021-09-13 LAB — CBC
HCT: 27.8 % — ABNORMAL LOW (ref 36.0–46.0)
Hemoglobin: 8.9 g/dL — ABNORMAL LOW (ref 12.0–15.0)
MCH: 30.8 pg (ref 26.0–34.0)
MCHC: 32 g/dL (ref 30.0–36.0)
MCV: 96.2 fL (ref 80.0–100.0)
Platelets: 469 10*3/uL — ABNORMAL HIGH (ref 150–400)
RBC: 2.89 MIL/uL — ABNORMAL LOW (ref 3.87–5.11)
RDW: 13.5 % (ref 11.5–15.5)
WBC: 12.1 10*3/uL — ABNORMAL HIGH (ref 4.0–10.5)
nRBC: 0 % (ref 0.0–0.2)

## 2021-09-13 LAB — MAGNESIUM: Magnesium: 1.4 mg/dL — ABNORMAL LOW (ref 1.7–2.4)

## 2021-09-13 LAB — TROPONIN I (HIGH SENSITIVITY)
Troponin I (High Sensitivity): 25 ng/L — ABNORMAL HIGH (ref <18)
Troponin I (High Sensitivity): 29 ng/L — ABNORMAL HIGH (ref <18)

## 2021-09-13 MED ORDER — LOPERAMIDE HCL 2 MG PO TABS: 2.0000 mg | ORAL_TABLET | Freq: Four times a day (QID) | ORAL | 0 refills | Status: DC | PRN

## 2021-09-13 NOTE — ED Notes (Signed)
E signature pad not working. Pt educated on discharge instructions and verbalized understanding.  

## 2021-09-13 NOTE — ED Notes (Signed)
Patient used call light, states she "isn't comfortable." PA aware. Patient was repositioned on stretcher.

## 2021-09-13 NOTE — ED Provider Notes (Signed)
[facility] Provider Note  Patient Contact: 7:29 PM (approximate)   History   Chest Pain   HPI  Joyce [name] is a 76 y.o. female who presents the emergency department complaining of chest pain.  Patient states that she is a dialysis patient, was at dialysis today as she has a Monday, Wednesday, Friday dialysis patient.  States that she came home, started to have some central chest pain.  No radiation.  Not pleuritic in nature.  Patient was recently admitted initially for concern for an NSTEMI but was it was determined that she had demand ischemia.  Patient states that the pain is worse with movement or palpation of her chest wall.  There is no shortness of breath.  Patient did have diarrhea yesterday but none today.  Patient denies any abdominal pain, urinary changes.  No medications prior to arrival.     Physical Exam   Triage Vital Signs: ED Triage Vitals  Enc Vitals Group     BP 09/13/21 1620 (!) 178/62     Pulse Rate 09/13/21 1620 75     Resp 09/13/21 1620 20     Temp 09/13/21 1620 98.1 F (36.7 C)     Temp Source 09/13/21 1620 Oral     SpO2 09/13/21 1620 99 %     Weight 09/13/21 1617 139 lb 5.3 oz (63.2 kg)     Height 09/13/21 1617 5\' 3"  (1.6 m)     Head Circumference --      Peak Flow --      Pain Score 09/13/21 1617 5     Pain Loc --      Pain Edu? --      Excl. in [place]? --     Most recent vital signs: Vitals:   09/13/21 1620 09/13/21 1830  BP: (!) 178/62 (!) 165/65  Pulse: 75 77  Resp: 20 17  Temp: 98.1 F (36.7 C)   SpO2: 99% 97%     General: Alert and in no acute distress. ENT:      Ears:       Nose: No congestion/rhinnorhea.      Mouth/Throat: Mucous membranes are moist.  Cardiovascular:  Good peripheral perfusion.  No murmurs, rubs, gallops.  No apical heave. Respiratory: Normal respiratory effort without tachypnea or retractions. Lungs CTAB. Good air entry to the bases with no decreased or absent breath  sounds. Gastrointestinal: Bowel sounds 4 quadrants. Soft and nontender to palpation. No guarding or rigidity. No palpable masses. No distention. No CVA tenderness. Musculoskeletal: Full range of motion to all extremities.  Palpation along the anterior chest wall reveals tenderness in the left parasternal border overlying the cartilaginous region.  Good underlying breath sounds.  Equal chest rise and fall.  No palpable abnormalities.  No signs of trauma to the chest. Neurologic:  No gross focal neurologic deficits are appreciated.  Skin:   No rash noted Other:   ED Results / Procedures / Treatments   Labs (all labs ordered are listed, but only abnormal results are displayed) Labs Reviewed  BASIC METABOLIC PANEL - Abnormal; Notable for the following components:      Result Value   Potassium 3.1 (*)    Glucose, Bld 100 (*)    Creatinine, Ser 2.90 (*)    Calcium 8.7 (*)    GFR, Estimated 16 (*)    All other components within normal limits  CBC - Abnormal; Notable for the following components:   WBC 12.1 (*)  RBC 2.89 (*)    Hemoglobin 8.9 (*)    HCT 27.8 (*)    Platelets 469 (*)    All other components within normal limits  MAGNESIUM - Abnormal; Notable for the following components:   Magnesium 1.4 (*)    All other components within normal limits  TROPONIN I (HIGH SENSITIVITY) - Abnormal; Notable for the following components:   Troponin I (High Sensitivity) 25 (*)    All other components within normal limits  TROPONIN I (HIGH SENSITIVITY) - Abnormal; Notable for the following components:   Troponin I (High Sensitivity) 29 (*)    All other components within normal limits     EKG  ED ECG REPORT I, Charline Bills Autymn Omlor,  personally viewed and interpreted this ECG.   Date: 09/09/2021  EKG Time: 1616 hrs.  Rate: 73 bpm  Rhythm: unchanged from previous tracings, sinus arrhythmia.  No significant changes from previous EKG from 08/21/2021.  Axis: Leftward axis   Intervals:none  ST&T Change: No ST elevation or depression noted  Normal sinus rhythm with sinus arrhythmia.  Artifact identified on EKG but no significant changes from previous EKG from 08/30/2021.  No STEMI.    RADIOLOGY  I personally viewed and evaluated these images as part of my medical decision making, as well as reviewing the written report by the radiologist.  ED Provider Interpretation: No acute findings on chest x-ray.  Chronic interstitial lung markings with some left basilar atelectasis  DG Chest 2 View  Result Date: 09/13/2021 CLINICAL DATA:  Chest pain. EXAM: CHEST - 2 VIEW COMPARISON:  August 31, 2021 FINDINGS: Low lung volumes are seen with mild, diffuse, chronic appearing increased interstitial lung markings. A trace amount of atelectasis is noted within the left lung base. There is no evidence of a pleural effusion or pneumothorax. The heart size and mediastinal contours are within normal limits. A radiopaque vascular stent is again seen overlying the left axilla. Radiopaque surgical clips are seen within the right upper quadrant. Chronic right-sided rib fractures are noted. IMPRESSION: Chronic appearing increased interstitial lung markings with trace amount of left basilar atelectasis. Electronically Signed   By: Virgina Norfolk M.D.   On: 09/13/2021 17:22    PROCEDURES:  Critical Care performed: No  Procedures   MEDICATIONS ORDERED IN ED: Medications - No data to display   IMPRESSION / MDM / Francis / ED COURSE  I reviewed the triage vital signs and the nursing notes.                              Differential diagnosis includes, but is not limited to, STEMI, NSTEMI, costochondritis, rib fracture, pneumonia, bronchitis, pneumothorax   Patient's diagnosis is consistent with chest wall pain.  Patient presented to the emergency department with central chest pain that started today after dialysis.  Patient had been recently admitted for what appeared  to be an NSTEMI and was ultimately decided to be heart strain.  Patient had tenderness to the left sternal border on physical exam.  EKG, troponins are reassuring.  Patient CBC, CMP are roughly at her baseline.  Patient also endorsed some diarrhea yesterday but none today.  At this time with work-up being reassuring, patient is stable for discharge.  Given her arrival complaint with her medical history admission definitely was considered however again with reassuring troponins, EKG and the fact that pain is reproducible with palpation over the chest wall the patient is stable  for discharge.  Follow-up with primary care as needed.  Return cautions are discussed with the patient at this time.. . Patient is given ED precautions to return to the ED for any worsening or new symptoms.    Clinical Course as of 09/13/21 1943  Ludwig Clarks Sep 13, 2021  1819 EKG 12-Lead [KL]    Clinical Course User Index [KL] Aurea Graff, Student-PA     FINAL CLINICAL IMPRESSION(S) / ED DIAGNOSES   Final diagnoses:  Chest wall pain     Rx / DC Orders   ED Discharge Orders     None        Note:  This document was prepared using Dragon voice recognition software and may include unintentional dictation errors.   Brynda Peon 09/13/21 1943    Nena Polio, MD 09/13/21 2224

## 2021-09-13 NOTE — ED Triage Notes (Signed)
Pt to ED via [facility] from [facility]. Pt completed dialysis and reports when she got home she started having non-radiating centralized CP. Pt states pain is 5/10. Pt also reports bilateral lower leg pain that started and SOB today. Pt with hx CHF and HTN. 20g LH

## 2021-09-13 NOTE — ED Notes (Signed)
Pt to ED see triage note has central chest tightness rates pain as 5/10. Denies radiation. EKG done in triage. Had dialysis today. Has dialysis graft on L arm.

## 2021-09-13 NOTE — ED Triage Notes (Signed)
Pt comes into the ED via [facility] from [facility] c/o central chest pain with no radiation.  Pt states she had dialysis today and it went well, but once she got back to her facility, that's when she started getting the chest pain at a 5/10.  Pt also c/o right leg pain that has been an ongoing problem today.  EKG NSR with occasional PVC.    188/73 97% RA 74 HR 145 CBG 324 ASA given.  22g R hand

## 2021-09-13 NOTE — ED Notes (Signed)
Spoke with patients sister and HCPOA who coordinated a ride for discharge with patients niece Ludwig Clarks. Report called to [facility].

## 2021-09-25 ENCOUNTER — Telehealth: Payer: Self-pay | Admitting: Cardiology

## 2021-09-25 NOTE — Telephone Encounter (Signed)
Spoke with [name] at [facility]. He stated they check all of the residents BP once a month and today when they checked the patient, her BP was 162/68. He stated that her HR was 71. He was concerned as he is the person that takes her to Dialysis evey M/W/F and last week she had passed out after one of her sessions. She was brought to the ED on 09/13/21 for Chest pain and was admitted for 3 days to r/o STEMI. It was decided that patient had demand Ischemia. I did not see any syncope documented in patients chart. [name] stated that he witnessed the syncopal event prior to patient going to the ED. Patients AVS stated to follow up with [facility] that come out to [facility], however, they only come on M/W/F and patient is always at Dialysis at those times. [name] stated that he is worried about the patient and believes that she should be seen. Scheduled patient for one of our DOD slots with Dr. Launa Grill on 09/27/21. ? ? ?

## 2021-09-25 NOTE — Telephone Encounter (Signed)
Pt c/o BP issue: STAT if pt c/o blurred vision, one-sided weakness or slurred speech ? ?1. What are your last 5 BP readings? 162/68 ? ?2. Are you having any other symptoms (ex. Dizziness, headache, blurred vision, passed out)? [name] not with patient at the moment but says he would say yes to all these symptoms - patient passed out the other day and taken to hospital  ? ?3. What is your BP issue? BP has been high, called to schedule appt but first available 04/07 - patient has dialysis on MWF.  Please call to discuss.  ? ?Joyce [name] ?[phone] ? ?

## 2021-09-27 ENCOUNTER — Ambulatory Visit: Payer: Medicare HMO | Admitting: Cardiology

## 2021-09-30 ENCOUNTER — Encounter: Payer: Self-pay | Admitting: Cardiology

## 2021-10-15 ENCOUNTER — Emergency Department: Payer: Medicare HMO

## 2021-10-15 ENCOUNTER — Emergency Department
Admission: EM | Admit: 2021-10-15 | Discharge: 2021-10-15 | Disposition: A | Discharge status: Skilled Nursing Facility | Payer: Medicare HMO | Attending: Student in an Organized Health Care Education/Training Program | Admitting: Student in an Organized Health Care Education/Training Program

## 2021-10-15 ENCOUNTER — Other Ambulatory Visit: Payer: Self-pay

## 2021-10-15 ENCOUNTER — Encounter: Payer: Self-pay | Admitting: Emergency Medicine

## 2021-10-15 DIAGNOSIS — R519 Headache, unspecified: Secondary | ICD-10-CM | POA: Insufficient documentation

## 2021-10-15 DIAGNOSIS — N186 End stage renal disease: Secondary | ICD-10-CM | POA: Insufficient documentation

## 2021-10-15 DIAGNOSIS — Z992 Dependence on renal dialysis: Secondary | ICD-10-CM | POA: Diagnosis not present

## 2021-10-15 DIAGNOSIS — I120 Hypertensive chronic kidney disease with stage 5 chronic kidney disease or end stage renal disease: Secondary | ICD-10-CM | POA: Insufficient documentation

## 2021-10-15 DIAGNOSIS — I10 Essential (primary) hypertension: Secondary | ICD-10-CM

## 2021-10-15 LAB — BASIC METABOLIC PANEL
Anion gap: 10 (ref 5–15)
BUN: 20 mg/dL (ref 8–23)
CO2: 28 mmol/L (ref 22–32)
Calcium: 9.3 mg/dL (ref 8.9–10.3)
Chloride: 98 mmol/L (ref 98–111)
Creatinine, Ser: 5.08 mg/dL — ABNORMAL HIGH (ref 0.44–1.00)
GFR, Estimated: 8 mL/min — ABNORMAL LOW (ref >60)
Glucose, Bld: 110 mg/dL — ABNORMAL HIGH (ref 70–99)
Potassium: 3.4 mmol/L — ABNORMAL LOW (ref 3.5–5.1)
Sodium: 136 mmol/L (ref 135–145)

## 2021-10-15 LAB — CBC
HCT: 32.4 % — ABNORMAL LOW (ref 36.0–46.0)
Hemoglobin: 10.1 g/dL — ABNORMAL LOW (ref 12.0–15.0)
MCH: 30 pg (ref 26.0–34.0)
MCHC: 31.2 g/dL (ref 30.0–36.0)
MCV: 96.1 fL (ref 80.0–100.0)
Platelets: 235 10*3/uL (ref 150–400)
RBC: 3.37 MIL/uL — ABNORMAL LOW (ref 3.87–5.11)
RDW: 14.6 % (ref 11.5–15.5)
WBC: 6.1 10*3/uL (ref 4.0–10.5)
nRBC: 0 % (ref 0.0–0.2)

## 2021-10-15 LAB — TROPONIN I (HIGH SENSITIVITY): Troponin I (High Sensitivity): 18 ng/L — ABNORMAL HIGH (ref <18)

## 2021-10-15 MED ORDER — CLONIDINE HCL 0.1 MG PO TABS
0.1000 mg | ORAL_TABLET | Freq: Once | ORAL | Status: AC
  Administered 2021-10-15: 0.1 mg via ORAL
  Filled 2021-10-15: qty 1

## 2021-10-15 NOTE — Discharge Instructions (Addendum)
Please follow-up with Dr. Zollie Scale in dialysis clinic for repeat blood pressure check.  They will determine at that point if blood pressure still elevated whether to continue on additional antihypertensive medications. ?

## 2021-10-15 NOTE — ED Notes (Signed)
This RN attempting IV access without success ?

## 2021-10-15 NOTE — ED Provider Notes (Signed)
? ?[facility] ?Provider Note ? ? ? Event Date/Time  ? First MD Initiated Contact with Patient 10/15/21 1525   ?  (approximate) ? ? ?History  ? ?Hypertension ? ? ?HPI ? ?Joyce [name] is a 76 y.o. female with end-stage renal disease on dialysis Monday Wednesday Friday being compliant with her dialysis sessions previously on midodrine for postdialysis hypotension near syncopal events presents to the ER for evaluation of concern over elevated blood pressure.  Was having some chest discomfort over the past day or 2 denies any chest pain right now.  Has been having some intermittent blurry vision.  Denies any stress or anxiety.  Denies any numbness or tingling no headaches.  Reportedly recently stopped her midodrine as her blood pressure was increasing. ?  ? ? ?Physical Exam  ? ?Triage Vital Signs: ?ED Triage Vitals  ?Enc Vitals Group  ?   BP 10/15/21 1506 (!) 190/72  ?   Pulse Rate 10/15/21 1506 66  ?   Resp 10/15/21 1506 18  ?   Temp 10/15/21 1506 98.4 ?F (36.9 ?C)  ?   Temp Source 10/15/21 1506 Oral  ?   SpO2 10/15/21 1506 96 %  ?   Weight 10/15/21 1507 141 lb (64 kg)  ?   Height 10/15/21 1507 '5\' 3"'$  (1.6 m)  ?   Head Circumference --   ?   Peak Flow --   ?   Pain Score 10/15/21 1507 8  ?   Pain Loc --   ?   Pain Edu? --   ?   Excl. in [place]? --   ? ? ?Most recent vital signs: ?Vitals:  ? 10/15/21 1700 10/15/21 1816  ?BP: (!) 175/61 (!) 188/70  ?Pulse: 73 68  ?Resp:  18  ?Temp:    ?SpO2: 97% 96%  ? ? ? ?Constitutional: Alert  ?Eyes: Conjunctivae are normal.  ?Head: Atraumatic. ?Nose: No congestion/rhinnorhea. ?Mouth/Throat: Mucous membranes are moist.   ?Neck: Painless ROM.  ?Cardiovascular:   Good peripheral circulation. Regular rate and rhythm ?Respiratory: Normal respiratory effort.  No retractions.   ctab ?Gastrointestinal: Soft and nontender.  ?Musculoskeletal:  no deformity ?Neurologic:  MAE spontaneously. No gross focal neurologic deficits are appreciated.  ?Skin:  Skin is warm, dry and  intact. No rash noted. ?Psychiatric: Mood and affect are normal. Speech and behavior are normal. ? ? ? ?ED Results / Procedures / Treatments  ? ?Labs ?(all labs ordered are listed, but only abnormal results are displayed) ?Labs Reviewed  ?BASIC METABOLIC PANEL - Abnormal; Notable for the following components:  ?    Result Value  ? Potassium 3.4 (*)   ? Glucose, Bld 110 (*)   ? Creatinine, Ser 5.08 (*)   ? GFR, Estimated 8 (*)   ? All other components within normal limits  ?CBC - Abnormal; Notable for the following components:  ? RBC 3.37 (*)   ? Hemoglobin 10.1 (*)   ? HCT 32.4 (*)   ? All other components within normal limits  ?TROPONIN I (HIGH SENSITIVITY) - Abnormal; Notable for the following components:  ? Troponin I (High Sensitivity) 18 (*)   ? All other components within normal limits  ?URINALYSIS, ROUTINE W REFLEX MICROSCOPIC  ?CBG MONITORING, ED  ?TROPONIN I (HIGH SENSITIVITY)  ? ? ? ?EKG ? ?ED ECG REPORT ?I, Merlyn Lot, the attending physician, personally viewed and interpreted this ECG. ? ? Date: 10/15/2021 ? EKG Time: 15:14 ? Rate: 65 ? Rhythm: sinus ? Axis:  normal ? Intervals: normal qt ? ST&T Change: no stemi, no depression ? ? ? ?RADIOLOGY ?Please see ED Course for my review and interpretation. ? ?I personally reviewed all radiographic images ordered to evaluate for the above acute complaints and reviewed radiology reports and findings.  These findings were personally discussed with the patient.  Please see medical record for radiology report. ? ? ? ?PROCEDURES: ? ?Critical Care performed: No ? ?Procedures ? ? ?MEDICATIONS ORDERED IN ED: ?Medications  ?cloNIDine (CATAPRES) tablet 0.1 mg (0.1 mg Oral Given 10/15/21 1955)  ? ? ? ?IMPRESSION / MDM / ASSESSMENT AND PLAN / ED COURSE  ?I reviewed the triage vital signs and the nursing notes. ?             ?               ? ?Differential diagnosis includes, but is not limited to, hypertensive urgency, medication noncompliance, CHF, volume overload, ESRD  essential hypertension, SAH, mass ? ?Patient presenting with elevated blood pressure as described above has had some recent medication changes.  She appears well clinically no focal neurodeficits.  Imaging will be ordered for the above differential we will check blood work.  She is not hypoxic.  Denying any chest pain at this time ? ? ?Clinical Course as of 10/15/21 2011  ?Tue Oct 15, 2021  ?1552 CT head on my review and interpretation does not show evidence of acute intracranial bleed will await formal radiology report [PR]  ?1942 Patient's troponin nonischemic baseline for her ESRD.  As this troponin is 4 hours out I do not feel that repeat troponin clinically indicated as she has been pain-free.  She denies any chest pain she is not showing signs of CHF no hypoxia.  Blood pressure is mildly elevated.  Discussion with nephrology will give one-time dose of clonidine here and patient will be reevaluated for repeat blood pressure check tomorrow to determine need for additional antihypertensive medications.  Hesitant to prescribe any long-term medications as she has had issues with postdialysis hypotension.  Will continue recommend she hold midodrine. [PR]  ?  ?Clinical Course User Index ?[PR] Merlyn Lot, MD  ? ? ? ?FINAL CLINICAL IMPRESSION(S) / ED DIAGNOSES  ? ?Final diagnoses:  ?Hypertension, unspecified type  ? ? ? ?Rx / DC Orders  ? ?ED Discharge Orders   ? ? None  ? ?  ? ? ? ?Note:  This document was prepared using Dragon voice recognition software and may include unintentional dictation errors. ? ?  ?Merlyn Lot, MD ?10/15/21 2012 ? ?

## 2021-10-15 NOTE — ED Triage Notes (Signed)
Pt to ED via [facility] with c/o hypertension, they took her off of her B/P medication on Friday and her B/P has been going up since. She has had some blurred vision since being taken off her medication. ?

## 2021-10-15 NOTE — ED Notes (Signed)
Pt called family friend to get a ride back to [place]. Discharge pending.  ?

## 2021-10-15 NOTE — ED Triage Notes (Signed)
FIRST NURSE NOTE:  ?Pt via EMS from [facility]. Pt c/o intermittent blurred vision and HTN since Friday. Denies any weakness. Pt does endorse headache that started today. Pt is a dialysis pt and has not missed any recent treatments. Pt is A&OX4 and NAD  ? ?184/55  ?96% on RA ?CBG 165  ?70 HR ?

## 2021-10-21 NOTE — Progress Notes (Signed)
? ?Cardiology Office Note   ? ?Date:  10/22/2021  ? ?ID:  Joyce [name], DOB [birth year], MRN [id number] ? ?PCP:  [facility]  ?Cardiologist:  Kate Sable, MD  ?Electrophysiologist:  None  ? ?Chief Complaint: Hospital follow-up ? ?History of Present Illness:  ? ?Joyce [name] is a 76 y.o. female with history of ESRD on HD complicated by postdialysis hypotension with near syncope and syncope previously on midodrine, HTN, orthostatic hypotension, pancreatitis, bipolar disorder, and vertigo who presents for hospital follow-up as outlined below. ? ?She has a history of orthostatic hypotension with near syncope that have been felt to be related to post hemodialysis hypotension, fluid restriction, and intermittent volume loss through GI illness.  Beta-blockers have previously been discontinued due to bradycardia and dizziness.  Outpatient cardiac monitoring in 10/2020, demonstrated a predominant rhythm of sinus with an average rate of 65 bpm (range 48 to 148 bpm, 9 runs of SVT with the fastest lasting 4 beats with a maximal rate of 148 bpm and the longest lasting 11 beats.  No significant arrhythmias, prolonged pauses, or evidence of high-grade AV block to explain her syncope.  Echo at that time demonstrated an EF of 55 to 60%, no regional wall motion abnormalities, grade 2 diastolic dysfunction, normal RV systolic function and ventricular cavity size, moderately elevated PASP estimated at 42.8 mmHg, severely dilated left atrium, mildly dilated right atrium, small pericardial effusion anterior to the RV, mild mitral regurgitation, and an estimated right atrial pressure of 8 mmHg.  It has been felt she may need some degree of permissive hypertension as her last outpatient BP was 166/68 with recommendation to defer medication changes due to drops in blood pressure and syncope postdialysis. ? ?She was admitted to the hospital from 2/10 through 09/02/2021 with altered mental status and labile  hypertension associated with hemodialysis.  High-sensitivity troponin peaked at 1920.  Echo demonstrated an EF of 60 to 65%, no regional wall motion abnormalities, mild LVH, indeterminate LV diastolic function parameters, normal RV systolic function and ventricular cavity size, mildly elevated PASP, severely dilated left atrium, mild mitral regurgitation, mild to moderate tricuspid regurgitation, and an estimated right atrial pressure of 3 mmHg.  Lexiscan MPI showed no evidence of ischemia or infarction with normal LV perfusion and was overall low risk.  VQ scan was without evidence of PE.  CT head and MRI brain showed no acute intracranial findings.  She was discharged on Lopressor 25 mg twice daily.  Midodrine was not on her discharge summary. ? ?She was seen in the ED on 09/13/2021 with chest pain following hemodialysis that was worse with movement or palpation of her chest wall.  High-sensitivity troponin 25 with a delta troponin of 29.  Chest x-ray showed chronic appearing interstitial lung markings with a trace amount of left basilar atelectasis.  Pain was felt to be musculoskeletal in etiology and she was advised to follow-up with her PCP. ? ?Our office was notified on 09/25/2021 with elevated BP readings in the 017P systolic.  Home health reportedly patient had also had a syncopal episode during her hemodialysis session on 2/24.  There is no mention of this in the ED note.  She was scheduled for office visit on 09/27/2021, though did not show for her appointment. ? ?She was most recently seen in the ED on 10/15/2021 with elevated blood pressure and chest discomfort 2 days prior.  It was noted she had previously been on midodrine for postdialysis hypotension with associated near syncope, though  this had been stopped due to elevated BP readings.  BP in the ED was 190/72 trending to 175/61 with a discharge blood pressure of 188/70.  High-sensitivity troponin 18.  Chest x-ray showed no active disease.  CT head showed  no acute intracranial process.  EKG showed sinus rhythm and was without acute ischemic changes.  Case was discussed with nephrology and she was given a one-time dose of clonidine with recommendation to follow-up as an outpatient. ? ?She comes in doing reasonably well from a cardiac perspective and is without symptoms of angina or decompensation.  She indicates that she has been prescribed amlodipine 5 mg and losartan 50 mg by outside offices, though has not yet started these medications.  She does report adherence to metoprolol tartrate 25 mg twice daily.  Blood pressures continue to be elevated in the 063K to 160F systolic.  She is no longer on midodrine.  Post hemodialysis, her BPs are trending in the 093A systolic.  She is without symptoms of dizziness, presyncope, or syncope, including post hemodialysis.  No significant lower extremity swelling. ? ? ?Labs independently reviewed: ?09/2021 - Hgb 10.1, PLT 235, potassium 3.4, BUN 20, serum creatinine 5.08 ?08/2021 - magnesium 1.4, TC 176, TG 46, HDL 65, LDL 102, albumin 3.4, AST/ALT normal ?10/2020 - TSH normal ? ?Past Medical History:  ?Diagnosis Date  ? Anal fissure   ? Bipolar affective disorder ([place])   ? CKD (chronic kidney disease)   ? Dr Holley Raring [name] 4  ? Colon polyps   ? Diverticulitis   ? Diverticulitis   ? Family history of adverse reaction to anesthesia   ? mom - PONV  ? GERD (gastroesophageal reflux disease)   ? Headache   ? migraines - none over 10 yrs  ? Heart murmur   ? History of hiatal hernia   ? Hypertension   ? Pancreatitis   ? Valproic acid  ? Vertigo   ? ? ?Past Surgical History:  ?Procedure Laterality Date  ? A/V FISTULAGRAM Left 04/14/2017  ? Procedure: A/V Fistulagram;  Surgeon: Katha Cabal, MD;  Location: [facility] [facility];  Service: Cardiovascular;  Laterality: Left;  ? A/V FISTULAGRAM Left 06/09/2017  ? Procedure: A/V FISTULAGRAM;  Surgeon: Katha Cabal, MD;  Location: [facility];  Service: Cardiovascular;   Laterality: Left;  ? A/V FISTULAGRAM Left 11/02/2017  ? Procedure: A/V FISTULAGRAM;  Surgeon: Algernon Huxley, MD;  Location: [facility] [facility];  Service: Cardiovascular;  Laterality: Left;  ? A/V FISTULAGRAM Left 12/22/2018  ? Procedure: A/V FISTULAGRAM;  Surgeon: Katha Cabal, MD;  Location: [place] [facility];  Service: Cardiovascular;  Laterality: Left;  ? A/V FISTULAGRAM Left 06/12/2020  ? Procedure: A/V FISTULAGRAM;  Surgeon: Katha Cabal, MD;  Location: [place] [facility];  Service: Cardiovascular;  Laterality: Left;  ? A/V FISTULAGRAM Left 01/22/2021  ? Procedure: A/V FISTULAGRAM;  Surgeon: Katha Cabal, MD;  Location: [place] [facility];  Service: Cardiovascular;  Laterality: Left;  ? A/V SHUNT INTERVENTION N/A 11/02/2017  ? Procedure: A/V SHUNT INTERVENTION;  Surgeon: Algernon Huxley, MD;  Location: [place] [facility];  Service: Cardiovascular;  Laterality: N/A;  ? ABDOMINAL HYSTERECTOMY  1990 ?  ? AV FISTULA PLACEMENT  4/30  ? BREAST EXCISIONAL BIOPSY Left 1994  ? neg surgical bx  ? CHOLECYSTECTOMY  2003  ? COLONOSCOPY  2014  ? Dr. Jamal Collin  ? COLONOSCOPY WITH PROPOFOL N/A 09/24/2015  ? Procedure: COLONOSCOPY WITH random colon  byopies.;  Surgeon: Lucilla Lame, MD;  Location: [facility];  Service: Endoscopy;  Laterality: N/A;  ? ESOPHAGOGASTRODUODENOSCOPY (EGD) WITH PROPOFOL N/A 09/24/2015  ? Procedure: ESOPHAGOGASTRODUODENOSCOPY (EGD) ;  Surgeon: Lucilla Lame, MD;  Location: [facility];  Service: Endoscopy;  Laterality: N/A;  ? ESOPHAGOGASTRODUODENOSCOPY (EGD) WITH PROPOFOL N/A 12/11/2020  ? Procedure: ESOPHAGOGASTRODUODENOSCOPY (EGD) WITH PROPOFOL;  Surgeon: Lucilla Lame, MD;  Location: [facility];  Service: Endoscopy;  Laterality: N/A;  ? EYE SURGERY    ? PERIPHERAL VASCULAR CATHETERIZATION N/A 05/29/2015  ? Procedure: A/V Shuntogram/Fistulagram;  Surgeon: Katha Cabal, MD;  Location: [facility];  Service: Cardiovascular;  Laterality: N/A;  ? PERIPHERAL  VASCULAR CATHETERIZATION N/A 05/29/2015  ? Procedure: A/V Shunt Intervention;  Surgeon: Katha Cabal, MD;  Location: [place] [facility];  Service: Cardiovascular;  Laterality: N/A;  ? POLYPECTOMY  09/24/2015  ? Procedure:

## 2021-10-22 ENCOUNTER — Encounter: Payer: Self-pay | Admitting: Physician Assistant

## 2021-10-22 ENCOUNTER — Ambulatory Visit (INDEPENDENT_AMBULATORY_CARE_PROVIDER_SITE_OTHER): Payer: Medicare HMO | Admitting: Physician Assistant

## 2021-10-22 VITALS — BP 170/80 | HR 79 | Ht 63.0 in | Wt 140.0 lb

## 2021-10-22 DIAGNOSIS — R55 Syncope and collapse: Secondary | ICD-10-CM

## 2021-10-22 DIAGNOSIS — I953 Hypotension of hemodialysis: Secondary | ICD-10-CM | POA: Diagnosis not present

## 2021-10-22 DIAGNOSIS — I951 Orthostatic hypotension: Secondary | ICD-10-CM | POA: Diagnosis not present

## 2021-10-22 DIAGNOSIS — R0989 Other specified symptoms and signs involving the circulatory and respiratory systems: Secondary | ICD-10-CM | POA: Diagnosis not present

## 2021-10-22 DIAGNOSIS — R1110 Vomiting, unspecified: Secondary | ICD-10-CM

## 2021-10-22 DIAGNOSIS — R197 Diarrhea, unspecified: Secondary | ICD-10-CM | POA: Diagnosis not present

## 2021-10-22 MED ORDER — AMLODIPINE BESYLATE 5 MG PO TABS: 5.0000 mg | ORAL_TABLET | Freq: Every day | ORAL | 3 refills | Status: DC

## 2021-10-22 MED ORDER — AMLODIPINE BESYLATE 5 MG PO TABS: 5.0000 mg | ORAL_TABLET | Freq: Every day | ORAL | 0 refills | Status: DC

## 2021-10-22 NOTE — Patient Instructions (Signed)
Medication Instructions:  ?Your physician has recommended you make the following change in your medication:  ? ?START Amlodipine 5 mg once daily  ? ?*If you need a refill on your cardiac medications before your next appointment, please call your pharmacy* ? ? ?Lab Work: ?None ? ?If you have labs (blood work) drawn today and your tests are completely normal, you will receive your results only by: ?MyChart Message (if you have MyChart) OR ?A paper copy in the mail ?If you have any lab test that is abnormal or we need to change your treatment, we will call you to review the results. ? ? ?Testing/Procedures: ?None ? ? ?Follow-Up: ?At [facility], you and your health needs are our priority.  As part of our continuing mission to provide you with exceptional heart care, we have created designated Provider Care Teams.  These Care Teams include your primary Cardiologist (physician) and Advanced Practice Providers (APPs -  Physician Assistants and Nurse Practitioners) who all work together to provide you with the care you need, when you need it. ? ? ?Your next appointment:   ?2 month(s) ? ?The format for your next appointment:   ?In Person ? ?Provider:   ?Kate Sable, MD or Christell Faith, PA-C ?

## 2021-11-07 ENCOUNTER — Ambulatory Visit (INDEPENDENT_AMBULATORY_CARE_PROVIDER_SITE_OTHER): Payer: Medicare HMO

## 2021-11-07 ENCOUNTER — Encounter (INDEPENDENT_AMBULATORY_CARE_PROVIDER_SITE_OTHER): Payer: Self-pay | Admitting: Nurse Practitioner

## 2021-11-07 ENCOUNTER — Ambulatory Visit (INDEPENDENT_AMBULATORY_CARE_PROVIDER_SITE_OTHER): Payer: Medicare HMO | Admitting: Nurse Practitioner

## 2021-11-07 VITALS — BP 139/59 | HR 49 | Resp 17 | Ht 63.0 in | Wt 140.0 lb

## 2021-11-07 DIAGNOSIS — T829XXS Unspecified complication of cardiac and vascular prosthetic device, implant and graft, sequela: Secondary | ICD-10-CM

## 2021-11-07 DIAGNOSIS — N186 End stage renal disease: Secondary | ICD-10-CM | POA: Diagnosis not present

## 2021-11-07 DIAGNOSIS — Z992 Dependence on renal dialysis: Secondary | ICD-10-CM

## 2021-11-07 DIAGNOSIS — I10 Essential (primary) hypertension: Secondary | ICD-10-CM

## 2021-11-23 ENCOUNTER — Encounter (INDEPENDENT_AMBULATORY_CARE_PROVIDER_SITE_OTHER): Payer: Self-pay | Admitting: Nurse Practitioner

## 2021-11-23 NOTE — Progress Notes (Signed)
? ?Subjective:  ? ? Patient ID: Joyce [name], female    DOB: 26-Jun-1946, 76 y.o.   MRN: [id number] ?No chief complaint on file. ? ? ?The patient returns to the office for followup of their dialysis access.  The patient underwent revision of her brachiocephalic access on 01/24/2375. ? ?The patient reports the function of the access has been stable. Patient denies difficulty with cannulation. The patient denies increased bleeding time after removing the needles. The patient denies hand pain or other symptoms consistent with steal phenomena.  No significant arm swelling. ? ?The patient denies any complaints from the dialysis center or their nephrologist. ? ?The patient denies redness or swelling at the access site. The patient denies fever or chills at home or while on dialysis. ? ?No recent shortening of the patient's walking distance or new symptoms consistent with claudication.  No history of rest pain symptoms. No new ulcers or wounds of the lower extremities have occurred. ? ?The patient denies amaurosis fugax or recent TIA symptoms. There are no recent neurological changes noted. ?There is no history of DVT, PE or superficial thrombophlebitis. ?No recent episodes of angina or shortness of breath documented.  ? ?Duplex ultrasound of the AV access shows a patent access.  The previously noted stenosis is not significantly changed compared to last study.  Flow volume today is 2823 cc/min (previous flow volume was 1498 cc/min) ?   ? ? ?Review of Systems  ?Skin:  Negative for wound.  ?All other systems reviewed and are negative. ? ?   ?Objective:  ? Physical Exam ?Vitals reviewed.  ?Cardiovascular:  ?   Rate and Rhythm: Normal rate.  ?   Pulses: Normal pulses.  ?Pulmonary:  ?   Effort: Pulmonary effort is normal.  ?Skin: ?   General: Skin is warm and dry.  ?Neurological:  ?   Mental Status: She is alert and oriented to person, place, and time.  ?Psychiatric:     ?   Mood and Affect: Mood normal.     ?   Behavior:  Behavior normal.     ?   Thought Content: Thought content normal.     ?   Judgment: Judgment normal.  ? ? ?BP (!) 139/59 (BP Location: Right Arm)   Pulse (!) 49   Resp 17   Ht '5\' 3"'$  (1.6 m)   Wt 140 lb (63.5 kg)   BMI 24.80 kg/m?  ? ?Past Medical History:  ?Diagnosis Date  ? Anal fissure   ? Bipolar affective disorder ([place])   ? CKD (chronic kidney disease)   ? Dr Holley Raring [name] 4  ? Colon polyps   ? Diverticulitis   ? Diverticulitis   ? Family history of adverse reaction to anesthesia   ? mom - PONV  ? GERD (gastroesophageal reflux disease)   ? Headache   ? migraines - none over 10 yrs  ? Heart murmur   ? History of hiatal hernia   ? Hypertension   ? Pancreatitis   ? Valproic acid  ? Vertigo   ? ? ?Social History  ? ?Socioeconomic History  ? Marital status: Divorced  ?  Spouse name: Not on file  ? Number of children: 1  ? Years of education: Not on file  ? Highest education level: Not on file  ?Occupational History  ? Occupation: Scientist, water quality  ?  Employer: [employer]  ?  Comment: retired  ?Tobacco Use  ? Smoking status: Never  ? Smokeless tobacco: Never  ?  Vaping Use  ? Vaping Use: Never used  ?Substance and Sexual Activity  ? Alcohol use: No  ?  Alcohol/week: 0.0 standard drinks  ? Drug use: No  ? Sexual activity: Not Currently  ?Other Topics Concern  ? Not on file  ?Social History Narrative  ? Patient lives in [facility]. ([place])  ? Walks independently.  ? ?Social Determinants of Health  ? ?Financial Resource Strain: Not on file  ?Food Insecurity: Not on file  ?Transportation Needs: Not on file  ?Physical Activity: Not on file  ?Stress: Not on file  ?Social Connections: Not on file  ?Intimate Partner Violence: Not on file  ? ? ?Past Surgical History:  ?Procedure Laterality Date  ? A/V FISTULAGRAM Left 04/14/2017  ? Procedure: A/V Fistulagram;  Surgeon: Katha Cabal, MD;  Location: [place] [facility];  Service: Cardiovascular;  Laterality: Left;  ? A/V FISTULAGRAM Left 06/09/2017  ?  Procedure: A/V FISTULAGRAM;  Surgeon: Katha Cabal, MD;  Location: [place] [facility];  Service: Cardiovascular;  Laterality: Left;  ? A/V FISTULAGRAM Left 11/02/2017  ? Procedure: A/V FISTULAGRAM;  Surgeon: Algernon Huxley, MD;  Location: [place] [facility];  Service: Cardiovascular;  Laterality: Left;  ? A/V FISTULAGRAM Left 12/22/2018  ? Procedure: A/V FISTULAGRAM;  Surgeon: Katha Cabal, MD;  Location: [place] [facility];  Service: Cardiovascular;  Laterality: Left;  ? A/V FISTULAGRAM Left 06/12/2020  ? Procedure: A/V FISTULAGRAM;  Surgeon: Katha Cabal, MD;  Location: [place] [facility];  Service: Cardiovascular;  Laterality: Left;  ? A/V FISTULAGRAM Left 01/22/2021  ? Procedure: A/V FISTULAGRAM;  Surgeon: Katha Cabal, MD;  Location: [place] [facility];  Service: Cardiovascular;  Laterality: Left;  ? A/V SHUNT INTERVENTION N/A 11/02/2017  ? Procedure: A/V SHUNT INTERVENTION;  Surgeon: Algernon Huxley, MD;  Location: [place] [facility];  Service: Cardiovascular;  Laterality: N/A;  ? ABDOMINAL HYSTERECTOMY  1990 ?  ? AV FISTULA PLACEMENT  4/30  ? BREAST EXCISIONAL BIOPSY Left 1994  ? neg surgical bx  ? CHOLECYSTECTOMY  2003  ? COLONOSCOPY  2014  ? Dr. Jamal Collin  ? COLONOSCOPY WITH PROPOFOL N/A 09/24/2015  ? Procedure: COLONOSCOPY WITH random colon byopies.;  Surgeon: Lucilla Lame, MD;  Location: [facility];  Service: Endoscopy;  Laterality: N/A;  ? ESOPHAGOGASTRODUODENOSCOPY (EGD) WITH PROPOFOL N/A 09/24/2015  ? Procedure: ESOPHAGOGASTRODUODENOSCOPY (EGD) ;  Surgeon: Lucilla Lame, MD;  Location: [facility];  Service: Endoscopy;  Laterality: N/A;  ? ESOPHAGOGASTRODUODENOSCOPY (EGD) WITH PROPOFOL N/A 12/11/2020  ? Procedure: ESOPHAGOGASTRODUODENOSCOPY (EGD) WITH PROPOFOL;  Surgeon: Lucilla Lame, MD;  Location: [facility];  Service: Endoscopy;  Laterality: N/A;  ? EYE SURGERY    ? PERIPHERAL VASCULAR CATHETERIZATION N/A 05/29/2015  ? Procedure: A/V Shuntogram/Fistulagram;   Surgeon: Katha Cabal, MD;  Location: [place] [facility];  Service: Cardiovascular;  Laterality: N/A;  ? PERIPHERAL VASCULAR CATHETERIZATION N/A 05/29/2015  ? Procedure: A/V Shunt Intervention;  Surgeon: Katha Cabal, MD;  Location: [place] [facility];  Service: Cardiovascular;  Laterality: N/A;  ? POLYPECTOMY  09/24/2015  ? Procedure: POLYPECTOMY INTESTINAL;  Surgeon: Lucilla Lame, MD;  Location: [place];  Service: Endoscopy;;  cecal polyp ?ascending polyp  ? REVISON OF ARTERIOVENOUS FISTULA Left 08/09/2021  ? Procedure: REVISON OF ARTERIOVENOUS FISTULA ( BRACHIAL CEPHALIC);  Surgeon: Katha Cabal, MD;  Location: [facility];  Service: Vascular;  Laterality: Left;  ? UPPER EXTREMITY ANGIOGRAM Left 08/09/2021  ? Procedure:  UPPER EXTREMITY ANGIOGRAM ( POSSIBLE INTERVENTION);  Surgeon: Katha Cabal, MD;  Location: [facility];  Service: Vascular;  Laterality: Left;  ? UPPER EXTREMITY ANGIOGRAPHY Left 07/02/2021  ? Procedure: UPPER EXTREMITY ANGIOGRAPHY;  Surgeon: Katha Cabal, MD;  Location: [place] [facility];  Service: Cardiovascular;  Laterality: Left;  ? ? ?Family History  ?Problem Relation Age of Onset  ? Stroke Father   ? Hypertension Father   ? Breast cancer Other   ? Colon cancer Neg Hx   ? Liver disease Neg Hx   ? Mental illness Neg Hx   ? ? ?Allergies  ?Allergen Reactions  ? Morphine And Related Shortness Of Breath  ?  Pt reports chest pain and difficulty breathing.  ? Gabapentin   ?  PER MAR  ? Indomethacin Hives  ? Penicillins Other (See Comments)  ?  PER MAR  ? Pollen Extract Other (See Comments)  ?  Sinus problems and HA  ? ? ? ?  Latest Ref Rng & Units 10/15/2021  ?  6:46 PM 09/13/2021  ?  4:21 PM 09/02/2021  ?  7:58 AM  ?CBC  ?WBC 4.0 - 10.5 K/uL 6.1   12.1   13.2    ?Hemoglobin 12.0 - 15.0 g/dL 10.1   8.9   9.0    ?Hematocrit 36.0 - 46.0 % 32.4   27.8   27.6    ?Platelets 150 - 400 K/uL 235   469   196    ? ? ? ? ?CMP  ?   ?Component Value Date/Time  ? NA 136 10/15/2021  1846  ? NA 143 10/23/2020 0930  ? NA 139 11/09/2014 1027  ? K 3.4 (L) 10/15/2021 1846  ? K 4.7 11/09/2014 1027  ? CL 98 10/15/2021 1846  ? CL 108 11/09/2014 1027  ? CO2 28 10/15/2021 1846  ? CO2 25 11/09/2014 1027  ? GLUCOSE

## 2021-11-28 ENCOUNTER — Encounter (INDEPENDENT_AMBULATORY_CARE_PROVIDER_SITE_OTHER): Payer: Medicare HMO

## 2021-11-28 ENCOUNTER — Ambulatory Visit (INDEPENDENT_AMBULATORY_CARE_PROVIDER_SITE_OTHER): Payer: Medicare HMO | Admitting: Vascular Surgery

## 2021-12-31 NOTE — Progress Notes (Signed)
Cardiology Office Note    Date:  01/02/2022   ID:  Joyce [name], Joyce [name] Joyce [name], Joyce [name], MRN [id number]  PCP:  Orvis Brill, [facility]  Cardiologist:  Kate Sable, MD  Electrophysiologist:  None   Chief Complaint: Follow-up  History of Present Illness:   Joyce [name] is a 76 y.o. female with history of ESRD on HD complicated by postdialysis hypotension with near syncope and syncope previously on midodrine, HTN, orthostatic hypotension, pancreatitis, bipolar disorder, and vertigo who presents for follow-up of hypertension.   She has a history of orthostatic hypotension with near syncope that have been felt to be related to post hemodialysis hypotension, fluid restriction, and intermittent volume loss through GI illness.  Beta-blockers have previously been discontinued due to bradycardia and dizziness.  Outpatient cardiac monitoring in 10/2020, demonstrated a predominant rhythm of sinus with an average rate of 65 bpm (range 48 to 148 bpm, 9 runs of SVT with the fastest lasting 4 beats with a maximal rate of 148 bpm and the longest lasting 11 beats.  No significant arrhythmias, prolonged pauses, or evidence of high-grade AV block to explain her syncope.  Echo at that time demonstrated an EF of 55 to 60%, no regional wall motion abnormalities, grade 2 diastolic dysfunction, normal RV systolic function and ventricular cavity size, moderately elevated PASP estimated at 42.8 mmHg, severely dilated left atrium, mildly dilated right atrium, small pericardial effusion anterior to the RV, mild mitral regurgitation, and an estimated right atrial pressure of 8 mmHg.  It has been felt she may need some degree of permissive hypertension will be needed due to drops in blood pressure and syncope post dialysis.   She was admitted to the hospital in 08/2021 with altered mental status and labile hypertension associated with hemodialysis.  High-sensitivity troponin peaked at 1920.  Echo demonstrated an EF of  60 to 65%, no regional wall motion abnormalities, mild LVH, indeterminate LV diastolic function parameters, normal RV systolic function and ventricular cavity size, mildly elevated PASP, severely dilated left atrium, mild mitral regurgitation, mild to moderate tricuspid regurgitation, and an estimated right atrial pressure of 3 mmHg.  Lexiscan MPI showed no evidence of ischemia or infarction with normal LV perfusion and was overall low risk.  VQ scan was without evidence of PE.  CT head and MRI brain showed no acute intracranial findings.  She was discharged on Lopressor 25 mg twice daily.  Midodrine was not on her discharge summary.   She was seen in the ED on 09/13/2021 with chest pain following hemodialysis that was worse with movement or palpation of her chest wall.  High-sensitivity troponin 25 with a delta troponin of 29.  Chest x-ray showed chronic appearing interstitial lung markings with a trace amount of left basilar atelectasis.  Pain was felt to be musculoskeletal in etiology and she was advised to follow-up with her PCP.   She was seen in the ED on 10/15/2021 with elevated blood pressure and chest discomfort 2 days prior.  It was noted she had previously been on midodrine for postdialysis hypotension with associated near syncope, though this had been stopped due to elevated BP readings.  BP in the ED was 190/72 trending to 175/61 with a discharge blood pressure of 188/70.  High-sensitivity troponin 18.  Chest x-ray showed no active disease.  CT head showed no acute intracranial process.  EKG showed sinus rhythm and was without acute ischemic changes.  Case was discussed with nephrology and she was given a one-time dose of clonidine  with recommendation to follow-up as an outpatient.  She was last seen in the office on 10/22/2021 and was without symptoms of angina or decompensation.  She indicated she had been prescribed amlodipine 5 mg and losartan 50 mg by outside offices, though had not yet started  these medications.  Blood pressures were in the 062I to 948N systolic.  She was no longer on midodrine.  Post hemodialysis, her BPs were trended into the 462V systolic.  It was recommended she continue Lopressor 25 mg 2 times daily and start the previously recommended amlodipine 5 mg.  She comes in doing well from a cardiac perspective and is without symptoms of angina or decompensation.  Since she was last seen, she indicates her blood pressure readings have stabilized without significant episodes of hypo or hypertension.  She denies any dizziness, presyncope, or syncope.  She does continue to take midodrine prior to hemodialysis.  No significant lower extremity swelling or orthopnea.  She does note some shortness of breath upon completion of hemodialysis, otherwise is without issues or complaints at this time.   Labs independently reviewed: 09/2021 - Hgb 10.1, PLT 235, potassium 3.4, BUN 20, serum creatinine 5.08 08/2021 - magnesium 1.4, TC 176, TG 46, HDL 65, LDL 102, albumin 3.4, AST/ALT normal 10/2020 - TSH normal  Past Medical History:  Diagnosis Date   Anal fissure    Bipolar affective disorder (HCC)    CKD (chronic kidney disease)    Dr Holley Raring [name] 4   Colon polyps    Diverticulitis    Diverticulitis    Family history of adverse reaction to anesthesia    mom - PONV   GERD (gastroesophageal reflux disease)    Headache    migraines - none over 10 yrs   Heart murmur    History of hiatal hernia    Hypertension    Pancreatitis    Valproic acid   Vertigo     Past Surgical History:  Procedure Laterality Date   A/V FISTULAGRAM Left 04/14/2017   Procedure: A/V Fistulagram;  Surgeon: Katha Cabal, MD;  Location: [facility] [facility];  Service: Cardiovascular;  Laterality: Left;   A/V FISTULAGRAM Left 06/09/2017   Procedure: A/V FISTULAGRAM;  Surgeon: Katha Cabal, MD;  Location: [place] [facility];  Service: Cardiovascular;  Laterality: Left;   A/V FISTULAGRAM Left  11/02/2017   Procedure: A/V FISTULAGRAM;  Surgeon: Algernon Huxley, MD;  Location: [place] [facility];  Service: Cardiovascular;  Laterality: Left;   A/V FISTULAGRAM Left 12/22/2018   Procedure: A/V FISTULAGRAM;  Surgeon: Katha Cabal, MD;  Location: [place] [facility];  Service: Cardiovascular;  Laterality: Left;   A/V FISTULAGRAM Left 06/12/2020   Procedure: A/V FISTULAGRAM;  Surgeon: Katha Cabal, MD;  Location: [place] [facility];  Service: Cardiovascular;  Laterality: Left;   A/V FISTULAGRAM Left 01/22/2021   Procedure: A/V FISTULAGRAM;  Surgeon: Katha Cabal, MD;  Location: [place] [facility];  Service: Cardiovascular;  Laterality: Left;   A/V SHUNT INTERVENTION N/A 11/02/2017   Procedure: A/V SHUNT INTERVENTION;  Surgeon: Algernon Huxley, MD;  Location: [place] [facility];  Service: Cardiovascular;  Laterality: N/A;   ABDOMINAL HYSTERECTOMY  1990 ?   AV FISTULA PLACEMENT  4/30   BREAST EXCISIONAL BIOPSY Left 1994   neg surgical bx   CHOLECYSTECTOMY  2003   COLONOSCOPY  2014   Dr. Jamal Collin   COLONOSCOPY WITH PROPOFOL N/A 09/24/2015   Procedure: COLONOSCOPY WITH random colon byopies.;  Surgeon: Lucilla Lame, MD;  Location: [place];  Service: Endoscopy;  Laterality: N/A;   ESOPHAGOGASTRODUODENOSCOPY (EGD) WITH PROPOFOL N/A 09/24/2015   Procedure: ESOPHAGOGASTRODUODENOSCOPY (EGD) ;  Surgeon: Lucilla Lame, MD;  Location: [place];  Service: Endoscopy;  Laterality: N/A;   ESOPHAGOGASTRODUODENOSCOPY (EGD) WITH PROPOFOL N/A 12/11/2020   Procedure: ESOPHAGOGASTRODUODENOSCOPY (EGD) WITH PROPOFOL;  Surgeon: Lucilla Lame, MD;  [facility];  Service: Endoscopy;  Laterality: N/A;   EYE SURGERY     PERIPHERAL VASCULAR CATHETERIZATION N/A 05/29/2015   Procedure: A/V Shuntogram/Fistulagram;  Surgeon: Katha Cabal, MD;  Location: [place] [facility];  Service: Cardiovascular;  Laterality: N/A;   PERIPHERAL VASCULAR CATHETERIZATION N/A 05/29/2015    Procedure: A/V Shunt Intervention;  Surgeon: Katha Cabal, MD;  Location: [place] [facility];  Service: Cardiovascular;  Laterality: N/A;   POLYPECTOMY  09/24/2015   Procedure: POLYPECTOMY INTESTINAL;  Surgeon: Lucilla Lame, MD;  Location: [place];  Service: Endoscopy;;  cecal polyp ascending polyp   REVISON OF ARTERIOVENOUS FISTULA Left 08/09/2021   Procedure: REVISON OF ARTERIOVENOUS FISTULA ( BRACHIAL CEPHALIC);  Surgeon: Katha Cabal, MD;  Location: [facility];  Service: Vascular;  Laterality: Left;   UPPER EXTREMITY ANGIOGRAM Left 08/09/2021   Procedure: UPPER EXTREMITY ANGIOGRAM ( POSSIBLE INTERVENTION);  Surgeon: Katha Cabal, MD;  Location: [facility];  Service: Vascular;  Laterality: Left;   UPPER EXTREMITY ANGIOGRAPHY Left 07/02/2021   Procedure: UPPER EXTREMITY ANGIOGRAPHY;  Surgeon: Katha Cabal, MD;  Location: [place] [facility];  Service: Cardiovascular;  Laterality: Left;    Current Medications: Current Meds  Medication Sig   acetaminophen (TYLENOL) 500 MG tablet Take 1,000 mg by mouth daily as needed for moderate pain.   amLODipine (NORVASC) 5 MG tablet Take 1 tablet (5 mg total) by mouth daily.   brimonidine (ALPHAGAN) 0.2 % ophthalmic solution Place 1 drop into both eyes 2 (two) times daily.   Brimonidine Tartrate (LUMIFY) 0.025 % SOLN Apply 1 drop to eye. Each eye PRN for redness   calcium acetate (PHOSLO) 667 MG capsule Take 667 mg by mouth 3 (three) times daily with meals.   carbamazepine (TEGRETOL) 100 MG chewable tablet Chew 100 mg by mouth 2 (two) times daily. (0600 & 2100)   Carboxymethylcellulose Sod PF 1 % GEL Place 3 drops into the right eye 4 (four) times daily.   Dexlansoprazole 30 MG capsule Take 30 mg by mouth daily. (0630)   diclofenac Sodium (VOLTAREN) 1 % GEL Apply 2 g topically 3 (three) times daily as needed (pain).   dicyclomine (BENTYL) 10 MG capsule Take 10 mg by mouth 4 (four) times daily -  before meals and at bedtime.    famotidine (PEPCID) 40 MG tablet Take 40 mg by mouth at bedtime. (2000)   fluticasone (FLONASE) 50 MCG/ACT nasal spray Place 1 spray into both nostrils in the morning. (0700)   haloperidol (HALDOL) 5 MG tablet TAKE 1/2 TABLET THREE TIMES WEEKLY ON MONDAY, WEDNESDAY AND FRIDAY  MORNING   levocetirizine (XYZAL) 5 MG tablet Take 5 mg by mouth daily.   lidocaine-prilocaine (EMLA) cream APPLY TO ACCESS AREA 30 MINUTES BEFORE DIALYSIS MONDAY / WEDNESDAY / FRIDAY   loratadine (CLARITIN) 10 MG tablet Take 10 mg by mouth daily.   losartan (COZAAR) 50 MG tablet Take 50 mg by mouth daily.   metoprolol tartrate (LOPRESSOR) 25 MG tablet    midodrine (PROAMATINE) 5 MG tablet Take 5 mg by mouth every Monday, Wednesday, and Friday with hemodialysis.  Take one tablet po at start of dialysis PRN for hypotension   montelukast (SINGULAIR) 10 MG tablet Take 10 mg by mouth daily.   Netarsudil-Latanoprost (ROCKLATAN) 0.02-0.005 % SOLN INSTILL 1 DROP INTO EACH EYE AT BEDTIME   nitrofurantoin, macrocrystal-monohydrate, (MACROBID) 100 MG capsule Take 100 mg by mouth 2 (two) times daily.   nitroGLYCERIN (NITROSTAT) 0.4 MG SL tablet Place 1 tablet (0.4 mg total) under the tongue every 5 (five) minutes x 3 doses as needed for chest pain.   omeprazole (PRILOSEC) 20 MG capsule Take 20 mg by mouth 2 (two) times daily.   rOPINIRole (REQUIP) 4 MG tablet Take 4 mg by mouth at bedtime.   timolol (TIMOPTIC) 0.5 % ophthalmic solution Place 1 drop into both eyes 2 (two) times daily.   traMADol (ULTRAM) 50 MG tablet Take 1 tablet (50 mg total) by mouth every 6 (six) hours as needed.   traZODone (DESYREL) 50 MG tablet Take 25 mg by mouth at bedtime.   vitamin B-12 (CYANOCOBALAMIN) 1000 MCG tablet Take 1,000 mcg by mouth in the morning. (0600)   ziprasidone (GEODON) 20 MG capsule Take 20 mg by mouth at bedtime. 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 ([place])   Walks independently.   Social Determinants of Health   Financial Resource Strain: Not on file  Food Insecurity: No Food Insecurity (12/22/2018)   Hunger Vital Sign    Worried About Running Out of Food in the Last Year: Never true    Ran Out of Food in the Last Year: Never true  Transportation Needs: No Transportation Needs (12/22/2018)   PRAPARE - Hydrologist (Medical): No    Lack of Transportation (Non-Medical): No  Physical Activity: Unknown (12/22/2018)   Exercise Vital Sign    Days of Exercise per Week: 7 days    Minutes of Exercise per Session: Not on file  Stress: Not on file  Social Connections: Unknown (12/22/2018)   Social Connection and Isolation Panel [NHANES]    Frequency of Communication with Friends and Family: More than three times a week    Frequency of Social Gatherings with Friends and Family: Not on file    Attends Religious Services: Not on file    Active Member of Clubs or Organizations: Not on file    Attends Archivist Meetings: Not on file    Marital Status: Divorced     Family History:  The patient's family history includes Breast cancer in an other family member; Hypertension in her father; Stroke in her father. There is no history of Colon cancer, Liver disease, or Mental  illness.  ROS:   12-point  review of systems is negative unless otherwise noted in the HPI.   EKGs/Labs/Other Studies Reviewed:    Studies reviewed were summarized above. The additional studies were reviewed today:  Lexiscan MPI 09/02/2021:   The study is normal. The study is low risk.   CT attenuation images showed minimal coronary calcifications.   LV perfusion is normal. There is no evidence of ischemia. There is no evidence of infarction.   Left ventricular function is normal. End diastolic cavity size is normal. End systolic cavity size is normal. __________   Echo 08/31/2021: 1. Left ventricular ejection fraction, by estimation, is 60 to 65%. The  left ventricle has normal function. The left ventricle has no regional  wall motion abnormalities. There is mild concentric left ventricular  hypertrophy. Left ventricular diastolic  parameters are indeterminate. The average left ventricular global  longitudinal strain is -16.8 %. The global longitudinal strain is normal.   2. Right ventricular systolic function is normal. The right ventricular  size is normal. There is mildly elevated pulmonary artery systolic  pressure.   3. Left atrial size was severely dilated.   4. The mitral valve is normal in structure. Mild mitral valve  regurgitation. No evidence of mitral stenosis.   5. Tricuspid valve regurgitation is mild to moderate.   6. The aortic valve is tricuspid. Aortic valve regurgitation is not  visualized. No aortic stenosis is present.   7. The inferior vena cava is normal in size with greater than 50%  respiratory variability, suggesting right atrial pressure of 3 mmHg. __________   2D echo 11/15/2020: 1. Left ventricular ejection fraction, by estimation, is 55 to 60%. Left  ventricular ejection fraction by 3D volume is 62 %. The left ventricle has  normal function. The left ventricle has no regional wall motion  abnormalities. Left ventricular diastolic   parameters are  consistent with Grade II diastolic dysfunction  (pseudonormalization). The average left ventricular global longitudinal  strain is -14.8 %. The global longitudinal strain is abnormal.   2. Right ventricular systolic function is normal. The right ventricular  size is normal. There is moderately elevated pulmonary artery systolic  pressure. The estimated right ventricular systolic pressure is 65.7 mmHg.   3. Left atrial size was severely dilated.   4. Right atrial size was mildly dilated.   5. A small pericardial effusion is present. The pericardial effusion is  anterior to the right ventricle.   6. The mitral valve is normal in structure. Mild mitral valve  regurgitation.   7. The aortic valve is tricuspid. Aortic valve regurgitation is not  visualized.   8. The inferior vena cava is normal in size with <50% respiratory  variability, suggesting right atrial pressure of 8 mmHg.   Comparison(s): EF 60-65%, PASP 54 mmHg. __________   Elwyn Reach patch 10/2020: Patient had a min HR of 48 bpm, max HR of 148 bpm, and avg HR of 65 bpm. Predominant underlying rhythm was Sinus Rhythm. 9 Supraventricular Tachycardia runs occurred, the run with the fastest interval lasting 4 beats with a max rate of 148 bpm, the  longest lasting 11 beats with an avg rate of 97 bpm. Isolated SVEs were rare (<1.0%), SVE Couplets were rare (<1.0%),  Patient with paroxysmal SVTs, no significant arrhythmias to explain etiology of syncope.  Overall benign cardiac monitor. __________   2D echo 05/22/2019: 1. Left ventricular ejection fraction, by visual estimation, is 60 to  65%. The left ventricle has normal function. Left ventricular septal wall  thickness was  normal. Normal left ventricular posterior wall thickness.  There is no left ventricular  hypertrophy.   2. Global right ventricle has normal systolic function.The right  ventricular size is normal. No increase in right ventricular wall  thickness.   3. Left atrial size  was normal.   4. Right atrial size was normal.   5. The mitral valve is normal in structure. No evidence of mitral valve  regurgitation.   6. The tricuspid valve is normal in structure. Tricuspid valve  regurgitation is not demonstrated.   7. The aortic valve is normal in structure. Aortic valve regurgitation is  not visualized.   8. The pulmonic valve was grossly normal. Pulmonic valve regurgitation is  not visualized.   9. Moderately elevated pulmonary artery systolic pressure. __________   2D echo 03/07/2018: - Left ventricle: The cavity size was normal. Wall thickness was    increased in a pattern of mild LVH. Systolic function was normal.    The estimated ejection fraction was in the range of 55% to 65%.  - Mitral valve: There was mild regurgitation. Valve area by    continuity equation (using LVOT flow): 2.88 cm^2.  - Left atrium: The atrium was mildly dilated.  - Pulmonary arteries: PA peak pressure: 58 mm Hg (S). __________   2D echo 09/02/2017: - Left ventricle: The cavity size was normal. Systolic function was    normal. The estimated ejection fraction was in the range of 55%    to 60%. Wall motion was normal; there were no regional wall    motion abnormalities. Features are consistent with a pseudonormal    left ventricular filling pattern, with concomitant abnormal    relaxation and increased filling pressure (grade 2 diastolic    dysfunction).  - Left atrium: The atrium was moderately dilated.  - Right ventricle: Systolic function was normal.  - Pulmonary arteries: Systolic pressure was mildly elevated. PA    peak pressure: 41 mm Hg (S).   EKG:  EKG is ordered today.  The EKG ordered today demonstrates sinus bradycardia, 54 bpm, left axis deviation, poor R wave progression along the precordial leads, no acute ST-T changes  Recent Labs: 08/30/2021: ALT 12 09/13/2021: Magnesium 1.4 10/15/2021: BUN 20; Creatinine, Ser 5.08; Hemoglobin 10.1; Platelets 235; Potassium 3.4;  Sodium 136  Recent Lipid Panel    Component Value Date/Time   CHOL 176 08/31/2021 0532   CHOL 105 05/14/2014 0412   TRIG 46 08/31/2021 0532   TRIG 127 05/14/2014 0412   HDL 65 08/31/2021 0532   HDL 37 (L) 05/14/2014 0412   CHOLHDL 2.7 08/31/2021 0532   VLDL 9 08/31/2021 0532   VLDL 25 05/14/2014 0412   LDLCALC 102 (H) 08/31/2021 0532   LDLCALC 43 05/14/2014 0412    PHYSICAL EXAM:    VS:  BP (!) 122/52   Pulse (!) 54   Ht '5\' 3"'$  (1.6 m)   Wt 138 lb 12.8 oz (63 kg)   SpO2 94%   BMI 24.59 kg/m   BMI: Body mass index is 24.59 kg/m.  Physical Exam Vitals reviewed.  Constitutional:      Appearance: She is well-developed.  HENT:     Head: Normocephalic and atraumatic.  Eyes:     General:        Right eye: No discharge.        Left eye: No discharge.  Neck:     Vascular: No JVD.  Cardiovascular:     Rate and Rhythm: Normal rate and regular  rhythm.     Pulses:          Posterior tibial pulses are 2+ on the right side and 2+ on the left side.     Heart sounds: Normal heart sounds, S1 normal and S2 normal. Heart sounds not distant. No midsystolic click and no opening snap. No murmur heard.    No friction rub.  Pulmonary:     Effort: Pulmonary effort is normal. No respiratory distress.     Breath sounds: Normal breath sounds. No decreased breath sounds, wheezing or rales.  Chest:     Chest wall: No tenderness.  Abdominal:     General: There is no distension.     Palpations: Abdomen is soft.     Tenderness: There is no abdominal tenderness.  Musculoskeletal:     Cervical back: Normal range of motion.     Right lower leg: No edema.     Left lower leg: No edema.  Skin:    General: Skin is warm and dry.     Nails: There is no clubbing.  Neurological:     Mental Status: She is alert and oriented to person, place, and time.  Psychiatric:        Speech: Speech normal.        Behavior: Behavior normal.        Thought Content: Thought content normal.        Judgment:  Judgment normal.     Wt Readings from Last 3 Encounters:  01/02/22 138 lb 12.8 oz (63 kg)  11/07/21 140 lb (63.5 kg)  10/22/21 140 lb (63.5 kg)     ASSESSMENT & PLAN:   Hemodialysis induced hypotension with associated near syncope and syncope/orthostatic hypotension: Longstanding issue.  No recent episodes of dizziness, near-syncope, or syncope associated with hemodialysis.  She will continue to need some degree of permissive hypertension moving forward.  She remains on as needed midodrine for hemodialysis.  Labile hypertension: Blood pressure has been well controlled since she was last seen.  She remains on amlodipine 5 mg daily and metoprolol tartrate 25 mg twice daily.    Disposition: F/u with Dr. Garen Lah or an APP in 6 months.   Medication Adjustments/Labs and Tests Ordered: Current medicines are reviewed at length with the patient today.  Concerns regarding medicines are outlined above. Medication changes, Labs and Tests ordered today are summarized above and listed in the Patient Instructions accessible in Encounters.   Signed, Christell Faith, PA-C 01/02/2022 11:35 AM     [facility] - [address] [place], [place] [address] [phone]

## 2022-01-02 ENCOUNTER — Encounter: Payer: Self-pay | Admitting: Physician Assistant

## 2022-01-02 ENCOUNTER — Ambulatory Visit (INDEPENDENT_AMBULATORY_CARE_PROVIDER_SITE_OTHER): Payer: Medicare HMO | Admitting: Physician Assistant

## 2022-01-02 VITALS — BP 122/52 | HR 54 | Ht 63.0 in | Wt 138.8 lb

## 2022-01-02 DIAGNOSIS — R0989 Other specified symptoms and signs involving the circulatory and respiratory systems: Secondary | ICD-10-CM | POA: Diagnosis not present

## 2022-01-02 DIAGNOSIS — I951 Orthostatic hypotension: Secondary | ICD-10-CM

## 2022-01-02 DIAGNOSIS — I953 Hypotension of hemodialysis: Secondary | ICD-10-CM

## 2022-01-02 NOTE — Patient Instructions (Signed)
Medication Instructions:  No changes at this time.   *If you need a refill on your cardiac medications before your next appointment, please call your pharmacy*   Lab Work: None  If you have labs (blood work) drawn today and your tests are completely normal, you will receive your results only by: MyChart Message (if you have MyChart) OR A paper copy in the mail If you have any lab test that is abnormal or we need to change your treatment, we will call you to review the results.   Testing/Procedures: None   Follow-Up: At [facility], you and your health needs are our priority.  As part of our continuing mission to provide you with exceptional heart care, we have created designated Provider Care Teams.  These Care Teams include your primary Cardiologist (physician) and Advanced Practice Providers (APPs -  Physician Assistants and Nurse Practitioners) who all work together to provide you with the care you need, when you need it.   Your next appointment:   6 month(s)  The format for your next appointment:   In Person  Provider:   Brian Agbor-Etang, MD or Ryan Dunn, PA-C      Important Information About Sugar       

## 2022-04-22 ENCOUNTER — Encounter (INDEPENDENT_AMBULATORY_CARE_PROVIDER_SITE_OTHER): Payer: Medicare HMO

## 2022-04-22 ENCOUNTER — Ambulatory Visit (INDEPENDENT_AMBULATORY_CARE_PROVIDER_SITE_OTHER): Payer: Medicare HMO | Admitting: Nurse Practitioner

## 2022-04-29 ENCOUNTER — Other Ambulatory Visit (INDEPENDENT_AMBULATORY_CARE_PROVIDER_SITE_OTHER): Payer: Self-pay | Admitting: Nurse Practitioner

## 2022-04-29 ENCOUNTER — Ambulatory Visit (INDEPENDENT_AMBULATORY_CARE_PROVIDER_SITE_OTHER): Payer: Medicare HMO

## 2022-04-29 ENCOUNTER — Encounter (INDEPENDENT_AMBULATORY_CARE_PROVIDER_SITE_OTHER): Payer: Self-pay | Admitting: Nurse Practitioner

## 2022-04-29 ENCOUNTER — Ambulatory Visit (INDEPENDENT_AMBULATORY_CARE_PROVIDER_SITE_OTHER): Payer: Medicare HMO | Admitting: Nurse Practitioner

## 2022-04-29 VITALS — BP 129/58 | HR 61 | Resp 16 | Ht 63.0 in | Wt 146.0 lb

## 2022-04-29 DIAGNOSIS — Z992 Dependence on renal dialysis: Secondary | ICD-10-CM | POA: Diagnosis not present

## 2022-04-29 DIAGNOSIS — I10 Essential (primary) hypertension: Secondary | ICD-10-CM | POA: Diagnosis not present

## 2022-04-29 DIAGNOSIS — N186 End stage renal disease: Secondary | ICD-10-CM | POA: Diagnosis not present

## 2022-05-02 ENCOUNTER — Telehealth (INDEPENDENT_AMBULATORY_CARE_PROVIDER_SITE_OTHER): Payer: Self-pay

## 2022-05-02 NOTE — Telephone Encounter (Signed)
Spoke with [name] at [facility]. The patient is scheduled with Dr. Delana Meyer for a left arm fistulagram on 05/20/22 with a 9:00 am arrival time to the [facility]. Pre-procedure instructions were discussed and will be mailed.

## 2022-05-05 ENCOUNTER — Encounter (INDEPENDENT_AMBULATORY_CARE_PROVIDER_SITE_OTHER): Payer: Self-pay | Admitting: Nurse Practitioner

## 2022-05-05 NOTE — H&P (View-Only) (Signed)
Subjective:    Patient ID: Joyce [name], female    DOB: 17-May-1946, 76 y.o.   MRN: [id number] Chief Complaint  Patient presents with   Follow-up    ultrasound    The patient returns to the office for followup status post intervention of their dialysis access left brachiocephalic AV fistula.   Following the intervention the access function has significantly improved, with better flow rates and improved KT/V. The patient has not been experiencing increased bleeding times following decannulation and the patient denies increased recirculation. The patient denies an increase in arm swelling. At the present time the patient denies hand pain.  No recent shortening of the patient's walking distance or new symptoms consistent with claudication.  No history of rest pain symptoms. No new ulcers or wounds of the lower extremities have occurred.  The patient denies amaurosis fugax or recent TIA symptoms. There are no recent neurological changes noted. There is no history of DVT, PE or superficial thrombophlebitis. No recent episodes of angina or shortness of breath documented.   Duplex ultrasound of the AV access shows a patent access.  The previously noted stenosis is improved compared to last study.  Flow volume today is 2823 cc/min (previous flow volume was 971 cc/min)        Review of Systems  All other systems reviewed and are negative.      Objective:   Physical Exam Vitals reviewed.  HENT:     Head: Normocephalic.  Cardiovascular:     Rate and Rhythm: Normal rate.     Pulses:          Radial pulses are 1+ on the right side and 1+ on the left side.     Arteriovenous access: Left arteriovenous access is present.    Comments: Good thrill and bruit Pulmonary:     Effort: Pulmonary effort is normal.  Skin:    General: Skin is warm and dry.  Neurological:     Mental Status: She is alert and oriented to person, place, and time.  Psychiatric:        Mood and Affect: Mood  normal.        Behavior: Behavior normal.        Thought Content: Thought content normal.        Judgment: Judgment normal.     BP (!) 129/58 (BP Location: Right Arm)   Pulse 61   Resp 16   Ht '5\' 3"'$  (1.6 m)   Wt 146 lb (66.2 kg)   BMI 25.86 kg/m   Past Medical History:  Diagnosis Date   Anal fissure    Bipolar affective disorder (HCC)    CKD (chronic kidney disease)    Dr Holley Raring [name] 4   Colon polyps    Diverticulitis    Diverticulitis    Family history of adverse reaction to anesthesia    mom - PONV   GERD (gastroesophageal reflux disease)    Headache    migraines - none over 10 yrs   Heart murmur    History of hiatal hernia    Hypertension    Pancreatitis    Valproic acid   Vertigo     Social History   Socioeconomic History   Marital status: Divorced    Spouse name: Not on file   Number of children: 1   Years of education: Not on file   Highest education level: Not on file  Occupational History   Occupation: Surveyor, quantity: [employer]  Comment: retired  Tobacco Use   Smoking status: Never   Smokeless tobacco: Never  Vaping Use   Vaping Use: Never used  Substance and Sexual Activity   Alcohol use: No    Alcohol/week: 0.0 standard drinks of alcohol   Drug use: No   Sexual activity: Not Currently  Other Topics Concern   Not on file  Social History Narrative   Patient lives in [facility]. ([place])   Walks independently.   Social Determinants of Health   Financial Resource Strain: Not on file  Food Insecurity: No Food Insecurity (12/22/2018)   Hunger Vital Sign    Worried About Running Out of Food in the Last Year: Never true    Ran Out of Food in the Last Year: Never true  Transportation Needs: No Transportation Needs (12/22/2018)   PRAPARE - Hydrologist (Medical): No    Lack of Transportation (Non-Medical): No  Physical Activity: Unknown (12/22/2018)   Exercise Vital Sign    Days of Exercise  per Week: 7 days    Minutes of Exercise per Session: Not on file  Stress: Not on file  Social Connections: Unknown (12/22/2018)   Social Connection and Isolation Panel [NHANES]    Frequency of Communication with Friends and Family: More than three times a week    Frequency of Social Gatherings with Friends and Family: Not on file    Attends Religious Services: Not on file    Active Member of Clubs or Organizations: Not on file    Attends Archivist Meetings: Not on file    Marital Status: Divorced  Intimate Partner Violence: Not At Risk (12/22/2018)   Humiliation, Afraid, Rape, and Kick questionnaire    Fear of Current or Ex-Partner: No    Emotionally Abused: No    Physically Abused: No    Sexually Abused: No    Past Surgical History:  Procedure Laterality Date   A/V FISTULAGRAM Left 04/14/2017   Procedure: A/V Fistulagram;  Surgeon: Katha Cabal, MD;  Location: [facility];  Service: Cardiovascular;  Laterality: Left;   A/V FISTULAGRAM Left 06/09/2017   Procedure: A/V FISTULAGRAM;  Surgeon: Katha Cabal, MD;  Location: [facility];  Service: Cardiovascular;  Laterality: Left;   A/V FISTULAGRAM Left 11/02/2017   Procedure: A/V FISTULAGRAM;  Surgeon: Algernon Huxley, MD;  Location: [facility];  Service: Cardiovascular;  Laterality: Left;   A/V FISTULAGRAM Left 12/22/2018   Procedure: A/V FISTULAGRAM;  Surgeon: Katha Cabal, MD;  Location: [facility];  Service: Cardiovascular;  Laterality: Left;   A/V FISTULAGRAM Left 06/12/2020   Procedure: A/V FISTULAGRAM;  Surgeon: Katha Cabal, MD;  Location: [facility];  Service: Cardiovascular;  Laterality: Left;   A/V FISTULAGRAM Left 01/22/2021   Procedure: A/V FISTULAGRAM;  Surgeon: Katha Cabal, MD;  Location: [place] [facility];  Service: Cardiovascular;  Laterality: Left;   A/V SHUNT INTERVENTION N/A 11/02/2017   Procedure: A/V SHUNT INTERVENTION;  Surgeon: Algernon Huxley, MD;  Location: [facility];  Service: Cardiovascular;  Laterality: N/A;   ABDOMINAL HYSTERECTOMY  1990 ?   AV FISTULA PLACEMENT  4/30   BREAST EXCISIONAL BIOPSY Left 1994   neg surgical bx   CHOLECYSTECTOMY  2003   COLONOSCOPY  2014   Dr. Jamal Collin   COLONOSCOPY WITH PROPOFOL N/A 09/24/2015   Procedure: COLONOSCOPY WITH random colon byopies.;  Surgeon: Lucilla Lame, MD;  Location: [place];  Service: Endoscopy;  Laterality: N/A;   ESOPHAGOGASTRODUODENOSCOPY (EGD) WITH PROPOFOL N/A 09/24/2015   Procedure: ESOPHAGOGASTRODUODENOSCOPY (EGD) ;  Surgeon: Lucilla Lame, MD;  Location: [place];  Service: Endoscopy;  Laterality: N/A;   ESOPHAGOGASTRODUODENOSCOPY (EGD) WITH PROPOFOL N/A 12/11/2020   Procedure: ESOPHAGOGASTRODUODENOSCOPY (EGD) WITH PROPOFOL;  Surgeon: Lucilla Lame, MD;  Location: [facility];  Service: Endoscopy;  Laterality: N/A;   EYE SURGERY     PERIPHERAL VASCULAR CATHETERIZATION N/A 05/29/2015   Procedure: A/V Shuntogram/Fistulagram;  Surgeon: Katha Cabal, MD;  Location: [place] [facility];  Service: Cardiovascular;  Laterality: N/A;   PERIPHERAL VASCULAR CATHETERIZATION N/A 05/29/2015   Procedure: A/V Shunt Intervention;  Surgeon: Katha Cabal, MD;  Location: [place] [facility];  Service: Cardiovascular;  Laterality: N/A;   POLYPECTOMY  09/24/2015   Procedure: POLYPECTOMY INTESTINAL;  Surgeon: Lucilla Lame, MD;  Location: [place];  Service: Endoscopy;;  cecal polyp ascending polyp   REVISON OF ARTERIOVENOUS FISTULA Left 08/09/2021   Procedure: REVISON OF ARTERIOVENOUS FISTULA ( BRACHIAL CEPHALIC);  Surgeon: Katha Cabal, MD;  Location: [facility];  Service: Vascular;  Laterality: Left;   UPPER EXTREMITY ANGIOGRAM Left 08/09/2021   Procedure: UPPER EXTREMITY ANGIOGRAM ( POSSIBLE INTERVENTION);  Surgeon: Katha Cabal, MD;  Location: [facility];  Service: Vascular;  Laterality: Left;   UPPER EXTREMITY ANGIOGRAPHY Left  07/02/2021   Procedure: UPPER EXTREMITY ANGIOGRAPHY;  Surgeon: Katha Cabal, MD;  Location: [place] [facility];  Service: Cardiovascular;  Laterality: Left;    Family History  Problem Relation Age of Onset   Stroke Father    Hypertension Father    Breast cancer Other    Colon cancer Neg Hx    Liver disease Neg Hx    Mental illness Neg Hx     Allergies  Allergen Reactions   Morphine And Related Shortness Of Breath    Pt reports chest pain and difficulty breathing.   Gabapentin     PER MAR   Indomethacin Hives   Penicillins Other (See Comments)    PER MAR   Pollen Extract Other (See Comments)    Sinus problems and HA       Latest Ref Rng & Units 10/15/2021    6:46 PM 09/13/2021    4:21 PM 09/02/2021    7:58 AM  CBC  WBC 4.0 - 10.5 K/uL 6.1  12.1  13.2   Hemoglobin 12.0 - 15.0 g/dL 10.1  8.9  9.0   Hematocrit 36.0 - 46.0 % 32.4  27.8  27.6   Platelets 150 - 400 K/uL 235  469  196       CMP     Component Value Date/Time   NA 136 10/15/2021 1846   NA 143 10/23/2020 0930   NA 139 11/09/2014 1027   K 3.4 (L) 10/15/2021 1846   K 4.7 11/09/2014 1027   CL 98 10/15/2021 1846   CL 108 11/09/2014 1027   CO2 28 10/15/2021 1846   CO2 25 11/09/2014 1027   GLUCOSE 110 (H) 10/15/2021 1846   GLUCOSE 100 (H) 11/09/2014 1027   BUN 20 10/15/2021 1846   BUN 24 10/23/2020 0930   BUN 51 (H) 11/09/2014 1027   CREATININE 5.08 (H) 10/15/2021 1846   CREATININE 2.98 (H) 11/09/2014 1027   CALCIUM 9.3 10/15/2021 1846   CALCIUM 8.9 11/09/2014 1027   PROT 6.5 08/30/2021 1514   PROT 5.3 (L) 05/14/2014 0412   ALBUMIN 3.4 (L) 08/30/2021 1514  ALBUMIN 2.7 (L) 05/14/2014 0412   AST 22 08/30/2021 1514   AST 18 05/14/2014 0412   ALT 12 08/30/2021 1514   ALT 25 05/14/2014 0412   ALKPHOS 71 08/30/2021 1514   ALKPHOS 53 05/14/2014 0412   BILITOT 0.5 08/30/2021 1514   BILITOT 0.5 05/14/2014 0412   GFRNONAA 8 (L) 10/15/2021 1846   GFRNONAA 15 (L) 11/09/2014 1027   GFRAA 11 (L)  05/24/2019 0513   GFRAA 18 (L) 11/09/2014 1027     No results found.     Assessment & Plan:   1. ESRD on dialysis [facility]) Recommend:  The patient is status post successful angiogram with intervention of their dialysis access.  Currently the patient has adequate access for continuation of their dialysis therapy.  The access remains patent with improved flow dynamics compared with the preprocedural noninvasive study.  Continue to use the AV access for dialysis, continue dialysis without interruption.  Schedule follow up in the office for 6 months with a duplex ultrasound of the AV access  - VAS US DUPLEX DIALYSIS ACCESS (AVF, AVG)  2. Primary hypertension Continue antihypertensive medications as already ordered, these medications have been reviewed and there are no changes at this time.    Current Outpatient Medications on File Prior to Visit  Medication Sig Dispense Refill   acetaminophen (TYLENOL) 500 MG tablet Take 1,000 mg by mouth daily as needed for moderate pain.     brimonidine (ALPHAGAN) 0.2 % ophthalmic solution Place 1 drop into both eyes 2 (two) times daily. 5 mL 1   Brimonidine Tartrate (LUMIFY) 0.025 % SOLN Apply 1 drop to eye. Each eye PRN for redness     calcium acetate (PHOSLO) 667 MG capsule Take 667 mg by mouth 3 (three) times daily with meals.     carbamazepine (TEGRETOL) 100 MG chewable tablet Chew 100 mg by mouth 2 (two) times daily. (0600 & 2100)     Carboxymethylcellulose Sod PF 1 % GEL Place 3 drops into the right eye 4 (four) times daily.     Dexlansoprazole 30 MG capsule Take 30 mg by mouth daily. (0630)     diclofenac Sodium (VOLTAREN) 1 % GEL Apply 2 g topically 3 (three) times daily as needed (pain).     dicyclomine (BENTYL) 10 MG capsule Take 10 mg by mouth 4 (four) times daily -  before meals and at bedtime.     famotidine (PEPCID) 40 MG tablet Take 40 mg by mouth at bedtime. (2000)     fluticasone (FLONASE) 50 MCG/ACT nasal spray Place 1 spray into  both nostrils in the morning. (0700)     haloperidol (HALDOL) 5 MG tablet TAKE 1/2 TABLET THREE TIMES WEEKLY ON MONDAY, WEDNESDAY AND FRIDAY  MORNING     levocetirizine (XYZAL) 5 MG tablet Take 5 mg by mouth daily.     lidocaine-prilocaine (EMLA) cream APPLY TO ACCESS AREA 30 MINUTES BEFORE DIALYSIS MONDAY / WEDNESDAY / FRIDAY     loratadine (CLARITIN) 10 MG tablet Take 10 mg by mouth daily.     losartan (COZAAR) 50 MG tablet Take 50 mg by mouth daily.     metoprolol tartrate (LOPRESSOR) 25 MG tablet      midodrine (PROAMATINE) 5 MG tablet Take 5 mg by mouth every Monday, Wednesday, and Friday with hemodialysis. Take one tablet po at start of dialysis PRN for hypotension     montelukast (SINGULAIR) 10 MG tablet Take 10 mg by mouth daily.     Netarsudil-Latanoprost (ROCKLATAN) 0.02-0.005 %  SOLN INSTILL 1 DROP INTO EACH EYE AT BEDTIME     nitrofurantoin, macrocrystal-monohydrate, (MACROBID) 100 MG capsule Take 100 mg by mouth 2 (two) times daily.     nitroGLYCERIN (NITROSTAT) 0.4 MG SL tablet Place 1 tablet (0.4 mg total) under the tongue every 5 (five) minutes x 3 doses as needed for chest pain. 15 tablet 12   omeprazole (PRILOSEC) 20 MG capsule Take 20 mg by mouth 2 (two) times daily.     rOPINIRole (REQUIP) 4 MG tablet Take 4 mg by mouth at bedtime.     timolol (TIMOPTIC) 0.5 % ophthalmic solution Place 1 drop into both eyes 2 (two) times daily.     traMADol (ULTRAM) 50 MG tablet Take 1 tablet (50 mg total) by mouth every 6 (six) hours as needed. 40 tablet 0   traZODone (DESYREL) 50 MG tablet Take 25 mg by mouth at bedtime.     vitamin B-12 (CYANOCOBALAMIN) 1000 MCG tablet Take 1,000 mcg by mouth in the morning. (0600)     ziprasidone (GEODON) 20 MG capsule Take 20 mg by mouth at bedtime. (2100)     amLODipine (NORVASC) 5 MG tablet Take 1 tablet (5 mg total) by mouth daily. 30 tablet 0   No current facility-administered medications on file prior to visit.    There are no Patient Instructions  on file for this visit. No follow-ups on file.   Kris Hartmann, NP

## 2022-05-05 NOTE — Progress Notes (Signed)
Subjective:    Patient ID: Ivonne Andrew, female    DOB: [birth year], 76 y.o.   MRN: [id number] Chief Complaint  Patient presents with   Follow-up    ultrasound    The patient returns to the office for followup status post intervention of their dialysis access left brachiocephalic AV fistula.   Following the intervention the access function has significantly improved, with better flow rates and improved KT/V. The patient has not been experiencing increased bleeding times following decannulation and the patient denies increased recirculation. The patient denies an increase in arm swelling. At the present time the patient denies hand pain.  No recent shortening of the patient's walking distance or new symptoms consistent with claudication.  No history of rest pain symptoms. No new ulcers or wounds of the lower extremities have occurred.  The patient denies amaurosis fugax or recent TIA symptoms. There are no recent neurological changes noted. There is no history of DVT, PE or superficial thrombophlebitis. No recent episodes of angina or shortness of breath documented.   Duplex ultrasound of the AV access shows a patent access.  The previously noted stenosis is improved compared to last study.  Flow volume today is 2823 cc/min (previous flow volume was 971 cc/min)        Review of Systems  All other systems reviewed and are negative.      Objective:   Physical Exam Vitals reviewed.  HENT:     Head: Normocephalic.  Cardiovascular:     Rate and Rhythm: Normal rate.     Pulses:          Radial pulses are 1+ on the right side and 1+ on the left side.     Arteriovenous access: Left arteriovenous access is present.    Comments: Good thrill and bruit Pulmonary:     Effort: Pulmonary effort is normal.  Skin:    General: Skin is warm and dry.  Neurological:     Mental Status: She is alert and oriented to person, place, and time.  Psychiatric:        Mood and Affect: Mood  normal.        Behavior: Behavior normal.        Thought Content: Thought content normal.        Judgment: Judgment normal.     BP (!) 129/58 (BP Location: Right Arm)   Pulse 61   Resp 16   Ht '5\' 3"'$  (1.6 m)   Wt 146 lb (66.2 kg)   BMI 25.86 kg/m   Past Medical History:  Diagnosis Date   Anal fissure    Bipolar affective disorder (HCC)    CKD (chronic kidney disease)    Dr Holley Raring [name] 4   Colon polyps    Diverticulitis    Diverticulitis    Family history of adverse reaction to anesthesia    mom - PONV   GERD (gastroesophageal reflux disease)    Headache    migraines - none over 10 yrs   Heart murmur    History of hiatal hernia    Hypertension    Pancreatitis    Valproic acid   Vertigo     Social History   Socioeconomic History   Marital status: Divorced    Spouse name: Not on file   Number of children: 1   Years of education: Not on file   Highest education level: Not on file  Occupational History   Occupation: Surveyor, quantity: [employer]  Comment: retired  Tobacco Use   Smoking status: Never   Smokeless tobacco: Never  Vaping Use   Vaping Use: Never used  Substance and Sexual Activity   Alcohol use: No    Alcohol/week: 0.0 standard drinks of alcohol   Drug use: No   Sexual activity: Not Currently  Other Topics Concern   Not on file  Social History Narrative   Patient lives in [facility]. ([place])   Walks independently.   Social Determinants of Health   Financial Resource Strain: Not on file  Food Insecurity: No Food Insecurity (12/22/2018)   Hunger Vital Sign    Worried About Running Out of Food in the Last Year: Never true    Ran Out of Food in the Last Year: Never true  Transportation Needs: No Transportation Needs (12/22/2018)   PRAPARE - Hydrologist (Medical): No    Lack of Transportation (Non-Medical): No  Physical Activity: Unknown (12/22/2018)   Exercise Vital Sign    Days of Exercise  per Week: 7 days    Minutes of Exercise per Session: Not on file  Stress: Not on file  Social Connections: Unknown (12/22/2018)   Social Connection and Isolation Panel [NHANES]    Frequency of Communication with Friends and Family: More than three times a week    Frequency of Social Gatherings with Friends and Family: Not on file    Attends Religious Services: Not on file    Active Member of Clubs or Organizations: Not on file    Attends Archivist Meetings: Not on file    Marital Status: Divorced  Intimate Partner Violence: Not At Risk (12/22/2018)   Humiliation, Afraid, Rape, and Kick questionnaire    Fear of Current or Ex-Partner: No    Emotionally Abused: No    Physically Abused: No    Sexually Abused: No    Past Surgical History:  Procedure Laterality Date   A/V FISTULAGRAM Left 04/14/2017   Procedure: A/V Fistulagram;  Surgeon: Katha Cabal, MD;  Location: [facility];  Service: Cardiovascular;  Laterality: Left;   A/V FISTULAGRAM Left 06/09/2017   Procedure: A/V FISTULAGRAM;  Surgeon: Katha Cabal, MD;  Location: [facility];  Service: Cardiovascular;  Laterality: Left;   A/V FISTULAGRAM Left 11/02/2017   Procedure: A/V FISTULAGRAM;  Surgeon: Algernon Huxley, MD;  Location: [facility];  Service: Cardiovascular;  Laterality: Left;   A/V FISTULAGRAM Left 12/22/2018   Procedure: A/V FISTULAGRAM;  Surgeon: Katha Cabal, MD;  Location: [facility];  Service: Cardiovascular;  Laterality: Left;   A/V FISTULAGRAM Left 06/12/2020   Procedure: A/V FISTULAGRAM;  Surgeon: Katha Cabal, MD;  Location: [facility];  Service: Cardiovascular;  Laterality: Left;   A/V FISTULAGRAM Left 01/22/2021   Procedure: A/V FISTULAGRAM;  Surgeon: Katha Cabal, MD;  Location: [facility];  Service: Cardiovascular;  Laterality: Left;   A/V SHUNT INTERVENTION N/A 11/02/2017   Procedure: A/V SHUNT INTERVENTION;  Surgeon: Algernon Huxley, MD;  Location: [facility];  Service: Cardiovascular;  Laterality: N/A;   ABDOMINAL HYSTERECTOMY  1990 ?   AV FISTULA PLACEMENT  4/30   BREAST EXCISIONAL BIOPSY Left 1994   neg surgical bx   CHOLECYSTECTOMY  2003   COLONOSCOPY  2014   Dr. Jamal Collin   COLONOSCOPY WITH PROPOFOL N/A 09/24/2015   Procedure: COLONOSCOPY WITH random colon byopies.;  Surgeon: Lucilla Lame, MD;  Location: [facility];  Service: Endoscopy;  Laterality: N/A;   ESOPHAGOGASTRODUODENOSCOPY (EGD) WITH PROPOFOL N/A 09/24/2015   Procedure: ESOPHAGOGASTRODUODENOSCOPY (EGD) ;  Surgeon: Lucilla Lame, MD;  Location: [facility];  Service: Endoscopy;  Laterality: N/A;   ESOPHAGOGASTRODUODENOSCOPY (EGD) WITH PROPOFOL N/A 12/11/2020   Procedure: ESOPHAGOGASTRODUODENOSCOPY (EGD) WITH PROPOFOL;  Surgeon: Lucilla Lame, MD;  Location: [facility];  Service: Endoscopy;  Laterality: N/A;   EYE SURGERY     PERIPHERAL VASCULAR CATHETERIZATION N/A 05/29/2015   Procedure: A/V Shuntogram/Fistulagram;  Surgeon: Katha Cabal, MD;  Location: [facility];  Service: Cardiovascular;  Laterality: N/A;   PERIPHERAL VASCULAR CATHETERIZATION N/A 05/29/2015   Procedure: A/V Shunt Intervention;  Surgeon: Katha Cabal, MD;  Location: [facility];  Service: Cardiovascular;  Laterality: N/A;   POLYPECTOMY  09/24/2015   Procedure: POLYPECTOMY INTESTINAL;  Surgeon: Lucilla Lame, MD;  Location: [facility];  Service: Endoscopy;;  cecal polyp ascending polyp   REVISON OF ARTERIOVENOUS FISTULA Left 08/09/2021   Procedure: REVISON OF ARTERIOVENOUS FISTULA ( BRACHIAL CEPHALIC);  Surgeon: Katha Cabal, MD;  Location: [facility];  Service: Vascular;  Laterality: Left;   UPPER EXTREMITY ANGIOGRAM Left 08/09/2021   Procedure: UPPER EXTREMITY ANGIOGRAM ( POSSIBLE INTERVENTION);  Surgeon: Katha Cabal, MD;  Location: [facility];  Service: Vascular;  Laterality: Left;   UPPER EXTREMITY ANGIOGRAPHY Left  07/02/2021   Procedure: UPPER EXTREMITY ANGIOGRAPHY;  Surgeon: Katha Cabal, MD;  Location: [facility];  Service: Cardiovascular;  Laterality: Left;    Family History  Problem Relation Age of Onset   Stroke Father    Hypertension Father    Breast cancer Other    Colon cancer Neg Hx    Liver disease Neg Hx    Mental illness Neg Hx     Allergies  Allergen Reactions   Morphine And Related Shortness Of Breath    Pt reports chest pain and difficulty breathing.   Gabapentin     PER MAR   Indomethacin Hives   Penicillins Other (See Comments)    PER MAR   Pollen Extract Other (See Comments)    Sinus problems and HA       Latest Ref Rng & Units 10/15/2021    6:46 PM 09/13/2021    4:21 PM 09/02/2021    7:58 AM  CBC  WBC 4.0 - 10.5 K/uL 6.1  12.1  13.2   Hemoglobin 12.0 - 15.0 g/dL 10.1  8.9  9.0   Hematocrit 36.0 - 46.0 % 32.4  27.8  27.6   Platelets 150 - 400 K/uL 235  469  196       CMP     Component Value Date/Time   NA 136 10/15/2021 1846   NA 143 10/23/2020 0930   NA 139 11/09/2014 1027   K 3.4 (L) 10/15/2021 1846   K 4.7 11/09/2014 1027   CL 98 10/15/2021 1846   CL 108 11/09/2014 1027   CO2 28 10/15/2021 1846   CO2 25 11/09/2014 1027   GLUCOSE 110 (H) 10/15/2021 1846   GLUCOSE 100 (H) 11/09/2014 1027   BUN 20 10/15/2021 1846   BUN 24 10/23/2020 0930   BUN 51 (H) 11/09/2014 1027   CREATININE 5.08 (H) 10/15/2021 1846   CREATININE 2.98 (H) 11/09/2014 1027   CALCIUM 9.3 10/15/2021 1846   CALCIUM 8.9 11/09/2014 1027   PROT 6.5 08/30/2021 1514   PROT 5.3 (L) 05/14/2014 0412   ALBUMIN 3.4 (L) 08/30/2021 1514  ALBUMIN 2.7 (L) 05/14/2014 0412   AST 22 08/30/2021 1514   AST 18 05/14/2014 0412   ALT 12 08/30/2021 1514   ALT 25 05/14/2014 0412   ALKPHOS 71 08/30/2021 1514   ALKPHOS 53 05/14/2014 0412   BILITOT 0.5 08/30/2021 1514   BILITOT 0.5 05/14/2014 0412   GFRNONAA 8 (L) 10/15/2021 1846   GFRNONAA 15 (L) 11/09/2014 1027   GFRAA 11 (L)  05/24/2019 0513   GFRAA 18 (L) 11/09/2014 1027     No results found.     Assessment & Plan:   1. ESRD on dialysis [facility]) Recommend:  The patient is status post successful angiogram with intervention of their dialysis access.  Currently the patient has adequate access for continuation of their dialysis therapy.  The access remains patent with improved flow dynamics compared with the preprocedural noninvasive study.  Continue to use the AV access for dialysis, continue dialysis without interruption.  Schedule follow up in the office for 6 months with a duplex ultrasound of the AV access  - VAS US DUPLEX DIALYSIS ACCESS (AVF, AVG)  2. Primary hypertension Continue antihypertensive medications as already ordered, these medications have been reviewed and there are no changes at this time.    Current Outpatient Medications on File Prior to Visit  Medication Sig Dispense Refill   acetaminophen (TYLENOL) 500 MG tablet Take 1,000 mg by mouth daily as needed for moderate pain.     brimonidine (ALPHAGAN) 0.2 % ophthalmic solution Place 1 drop into both eyes 2 (two) times daily. 5 mL 1   Brimonidine Tartrate (LUMIFY) 0.025 % SOLN Apply 1 drop to eye. Each eye PRN for redness     calcium acetate (PHOSLO) 667 MG capsule Take 667 mg by mouth 3 (three) times daily with meals.     carbamazepine (TEGRETOL) 100 MG chewable tablet Chew 100 mg by mouth 2 (two) times daily. (0600 & 2100)     Carboxymethylcellulose Sod PF 1 % GEL Place 3 drops into the right eye 4 (four) times daily.     Dexlansoprazole 30 MG capsule Take 30 mg by mouth daily. (0630)     diclofenac Sodium (VOLTAREN) 1 % GEL Apply 2 g topically 3 (three) times daily as needed (pain).     dicyclomine (BENTYL) 10 MG capsule Take 10 mg by mouth 4 (four) times daily -  before meals and at bedtime.     famotidine (PEPCID) 40 MG tablet Take 40 mg by mouth at bedtime. (2000)     fluticasone (FLONASE) 50 MCG/ACT nasal spray Place 1 spray into  both nostrils in the morning. (0700)     haloperidol (HALDOL) 5 MG tablet TAKE 1/2 TABLET THREE TIMES WEEKLY ON MONDAY, WEDNESDAY AND FRIDAY  MORNING     levocetirizine (XYZAL) 5 MG tablet Take 5 mg by mouth daily.     lidocaine-prilocaine (EMLA) cream APPLY TO ACCESS AREA 30 MINUTES BEFORE DIALYSIS MONDAY / WEDNESDAY / FRIDAY     loratadine (CLARITIN) 10 MG tablet Take 10 mg by mouth daily.     losartan (COZAAR) 50 MG tablet Take 50 mg by mouth daily.     metoprolol tartrate (LOPRESSOR) 25 MG tablet      midodrine (PROAMATINE) 5 MG tablet Take 5 mg by mouth every Monday, Wednesday, and Friday with hemodialysis. Take one tablet po at start of dialysis PRN for hypotension     montelukast (SINGULAIR) 10 MG tablet Take 10 mg by mouth daily.     Netarsudil-Latanoprost (ROCKLATAN) 0.02-0.005 %  SOLN INSTILL 1 DROP INTO EACH EYE AT BEDTIME     nitrofurantoin, macrocrystal-monohydrate, (MACROBID) 100 MG capsule Take 100 mg by mouth 2 (two) times daily.     nitroGLYCERIN (NITROSTAT) 0.4 MG SL tablet Place 1 tablet (0.4 mg total) under the tongue every 5 (five) minutes x 3 doses as needed for chest pain. 15 tablet 12   omeprazole (PRILOSEC) 20 MG capsule Take 20 mg by mouth 2 (two) times daily.     rOPINIRole (REQUIP) 4 MG tablet Take 4 mg by mouth at bedtime.     timolol (TIMOPTIC) 0.5 % ophthalmic solution Place 1 drop into both eyes 2 (two) times daily.     traMADol (ULTRAM) 50 MG tablet Take 1 tablet (50 mg total) by mouth every 6 (six) hours as needed. 40 tablet 0   traZODone (DESYREL) 50 MG tablet Take 25 mg by mouth at bedtime.     vitamin B-12 (CYANOCOBALAMIN) 1000 MCG tablet Take 1,000 mcg by mouth in the morning. (0600)     ziprasidone (GEODON) 20 MG capsule Take 20 mg by mouth at bedtime. (2100)     amLODipine (NORVASC) 5 MG tablet Take 1 tablet (5 mg total) by mouth daily. 30 tablet 0   No current facility-administered medications on file prior to visit.    There are no Patient Instructions  on file for this visit. No follow-ups on file.   Kris Hartmann, NP

## 2022-05-19 ENCOUNTER — Encounter (INDEPENDENT_AMBULATORY_CARE_PROVIDER_SITE_OTHER): Payer: Self-pay

## 2022-05-20 ENCOUNTER — Other Ambulatory Visit: Payer: Self-pay

## 2022-05-20 ENCOUNTER — Encounter
Admission: RE | Disposition: A | Discharge status: Skilled Nursing Facility | Payer: Self-pay | Source: Home / Self Care | Attending: Vascular Surgery

## 2022-05-20 ENCOUNTER — Ambulatory Visit
Admission: RE | Admit: 2022-05-20 | Discharge: 2022-05-20 | Disposition: A | Discharge status: Skilled Nursing Facility | Payer: Medicare HMO | Attending: Vascular Surgery | Admitting: Vascular Surgery

## 2022-05-20 ENCOUNTER — Encounter: Payer: Self-pay | Admitting: Vascular Surgery

## 2022-05-20 DIAGNOSIS — T82858A Stenosis of vascular prosthetic devices, implants and grafts, initial encounter: Secondary | ICD-10-CM | POA: Insufficient documentation

## 2022-05-20 DIAGNOSIS — Z992 Dependence on renal dialysis: Secondary | ICD-10-CM | POA: Diagnosis not present

## 2022-05-20 DIAGNOSIS — I120 Hypertensive chronic kidney disease with stage 5 chronic kidney disease or end stage renal disease: Secondary | ICD-10-CM | POA: Diagnosis not present

## 2022-05-20 DIAGNOSIS — N186 End stage renal disease: Secondary | ICD-10-CM | POA: Insufficient documentation

## 2022-05-20 DIAGNOSIS — Y841 Kidney dialysis as the cause of abnormal reaction of the patient, or of later complication, without mention of misadventure at the time of the procedure: Secondary | ICD-10-CM | POA: Insufficient documentation

## 2022-05-20 HISTORY — PX: A/V FISTULAGRAM: CATH118298

## 2022-05-20 LAB — POTASSIUM (ARMC VASCULAR LAB ONLY): Potassium (ARMC vascular lab): 4 mmol/L (ref 3.5–5.1)

## 2022-05-20 SURGERY — A/V FISTULAGRAM
Anesthesia: Moderate Sedation | Laterality: Left

## 2022-05-20 MED ORDER — MIDAZOLAM HCL 2 MG/ML PO SYRP: 8.0000 mg | ORAL_SOLUTION | Freq: Once | ORAL | Status: DC | PRN

## 2022-05-20 MED ORDER — IODIXANOL 320 MG/ML IV SOLN
INTRAVENOUS | Status: DC | PRN
  Administered 2022-05-20: 15 mL

## 2022-05-20 MED ORDER — MIDAZOLAM HCL 2 MG/2ML IJ SOLN
INTRAMUSCULAR | Status: DC | PRN
  Administered 2022-05-20 (×2): 1 mg via INTRAVENOUS

## 2022-05-20 MED ORDER — HEPARIN SODIUM (PORCINE) 1000 UNIT/ML IJ SOLN
INTRAMUSCULAR | Status: DC | PRN
  Administered 2022-05-20: 5000 [IU] via INTRAVENOUS

## 2022-05-20 MED ORDER — FAMOTIDINE 20 MG PO TABS: 40.0000 mg | ORAL_TABLET | Freq: Once | ORAL | Status: DC | PRN

## 2022-05-20 MED ORDER — METHYLPREDNISOLONE SODIUM SUCC 125 MG IJ SOLR: 125.0000 mg | Freq: Once | INTRAMUSCULAR | Status: DC | PRN

## 2022-05-20 MED ORDER — VANCOMYCIN HCL IN DEXTROSE 1-5 GM/200ML-% IV SOLN
INTRAVENOUS | Status: DC | PRN
  Administered 2022-05-20: 1000 mg via INTRAVENOUS

## 2022-05-20 MED ORDER — HYDROMORPHONE HCL 1 MG/ML IJ SOLN: 1.0000 mg | Freq: Once | INTRAMUSCULAR | Status: DC | PRN

## 2022-05-20 MED ORDER — ONDANSETRON HCL 4 MG/2ML IJ SOLN: 4.0000 mg | Freq: Four times a day (QID) | INTRAMUSCULAR | Status: DC | PRN

## 2022-05-20 MED ORDER — SODIUM CHLORIDE 0.9 % IV SOLN: INTRAVENOUS | Status: DC

## 2022-05-20 MED ORDER — DIPHENHYDRAMINE HCL 50 MG/ML IJ SOLN: 50.0000 mg | Freq: Once | INTRAMUSCULAR | Status: DC | PRN

## 2022-05-20 MED ORDER — VANCOMYCIN HCL IN DEXTROSE 1-5 GM/200ML-% IV SOLN
INTRAVENOUS | Status: AC
  Filled 2022-05-20: qty 200

## 2022-05-20 MED ORDER — FENTANYL CITRATE (PF) 100 MCG/2ML IJ SOLN
INTRAMUSCULAR | Status: AC
  Filled 2022-05-20: qty 2

## 2022-05-20 MED ORDER — FENTANYL CITRATE (PF) 100 MCG/2ML IJ SOLN
INTRAMUSCULAR | Status: DC | PRN
  Administered 2022-05-20 (×2): 25 ug via INTRAVENOUS

## 2022-05-20 MED ORDER — HEPARIN SODIUM (PORCINE) 1000 UNIT/ML IJ SOLN
INTRAMUSCULAR | Status: AC
  Filled 2022-05-20: qty 10

## 2022-05-20 MED ORDER — VANCOMYCIN HCL IN DEXTROSE 1-5 GM/200ML-% IV SOLN: 1000.0000 mg | INTRAVENOUS | Status: DC

## 2022-05-20 MED ORDER — MIDAZOLAM HCL 5 MG/5ML IJ SOLN
INTRAMUSCULAR | Status: AC
  Filled 2022-05-20: qty 5

## 2022-05-20 SURGICAL SUPPLY — 20 items
BALLN DORADO 8X100X80 (BALLOONS) ×1
BALLN ULTRVRSE 9X40X75C (BALLOONS) ×1
BALLOON DORADO 8X100X80 (BALLOONS) IMPLANT
BALLOON ULTRVRSE 9X40X75C (BALLOONS) IMPLANT
CATH BEACON 5 .035 40 KMP TP (CATHETERS) IMPLANT
CATH BEACON 5 .038 40 KMP TP (CATHETERS) ×1
COVER PROBE ULTRASOUND 5X96 (MISCELLANEOUS) IMPLANT
DRAPE BRACHIAL (DRAPES) IMPLANT
KIT ENCORE 26 ADVANTAGE (KITS) IMPLANT
NDL ENTRY 21GA 7CM ECHOTIP (NEEDLE) IMPLANT
NEEDLE ENTRY 21GA 7CM ECHOTIP (NEEDLE) ×1 IMPLANT
PACK ANGIOGRAPHY (CUSTOM PROCEDURE TRAY) ×1 IMPLANT
SET INTRO CAPELLA COAXIAL (SET/KITS/TRAYS/PACK) IMPLANT
SHEATH BRITE TIP 6FRX5.5 (SHEATH) IMPLANT
SHEATH BRITE TIP 7FRX5.5 (SHEATH) IMPLANT
STENT VIABAHN 8X100X120 (Permanent Stent) ×1 IMPLANT
STENT VIABAHN 8X10X120 (Permanent Stent) IMPLANT
SUT MNCRL AB 4-0 PS2 18 (SUTURE) IMPLANT
WIRE G V18X300CM (WIRE) IMPLANT
WIRE MAGIC TOR.035 180C (WIRE) IMPLANT

## 2022-05-20 NOTE — Op Note (Signed)
OPERATIVE NOTE   PROCEDURE: Contrast injection left brachiocephalic AV access Percutaneous transluminal angioplasty and stent placement at the subclavian cephalic confluence  PRE-OPERATIVE DIAGNOSIS: Complication of dialysis access                                                       End Stage Renal Disease  POST-OPERATIVE DIAGNOSIS: same as above   SURGEON: Katha Cabal, M.D.  ANESTHESIA: Conscious sedation was administered under my direct supervision by the interventional radiology RN. IV Versed plus fentanyl were utilized. Continuous ECG, pulse oximetry and blood pressure was monitored throughout the entire procedure.  Conscious sedation was for a total of 37 minutes and 9 seconds.  ESTIMATED BLOOD LOSS: minimal  FINDING(S): Stricture of the AV graft  SPECIMEN(S):  None  CONTRAST: 15 cc  FLUOROSCOPY TIME: 4.2 minutes  INDICATIONS: Hampton Cost is a 76 y.o. female who  presents with malfunctioning left arm AV fistula.  The patient is scheduled for angiography with possible intervention of the AV access.  The patient is aware the risks include but are not limited to: bleeding, infection, thrombosis of the cannulated access, and possible anaphylactic reaction to the contrast.  The patient acknowledges if the access can not be salvaged a tunneled catheter will be needed and will be placed during this procedure.  The patient is aware of the risks of the procedure and elects to proceed with the angiogram and intervention.  DESCRIPTION: After full informed written consent was obtained, the patient was brought back to the Special Procedure suite and placed supine position.  Appropriate cardiopulmonary monitors were placed.  The left arm was prepped and draped in the standard fashion.  Appropriate timeout is called. The left brachiocephalic was cannulated with a micropuncture needle.  Cannulation was performed with ultrasound guidance. Ultrasound was placed in a sterile sleeve,  the AV access was interrogated and noted to be echolucent and compressible indicating patency. Image was recorded for the permanent record. The puncture is performed under continuous ultrasound visualization.   The microwire was advanced and the needle was exchanged for  a microsheath.  The J-wire was then advanced and a 6 Fr sheath inserted later is upsized to a 7 [place] sheath.  Hand injections were completed to image the access from the arterial anastomosis through the entire access.  The central venous structures were also imaged by hand injections.  Based on the images,  4000 units of heparin was given and a wire was negotiated through the strictures within the venous portion of the graft.  An 8 mm x 100 mm [place] balloon was used.  Inflation was to 16 to 18 atm for 1 minute.  Follow-up imaging demonstrates greater than 50% residual stenosis and the 035 wire was exchanged for a V18 wire.  An 8 mm x 100 mm Viabahn stent was then opened onto the field and advanced across the lesion was deployed without difficulty and then postdilated with the 8 mm x 100 mm balloon inflated to 16 atm for approximately 1 minute.    Follow-up imaging demonstrates less than 10% residual stenosis with rapid flow of contrast through the graft, the central venous anatomy is preserved.  A 4-0 Monocryl purse-string suture was sewn around the sheath.  The sheath was removed and light pressure was applied.  A sterile bandage was  applied to the puncture site.    COMPLICATIONS: None  CONDITION: [name], M.D [facility] Office: [phone]  05/20/2022 3:43 PM

## 2022-05-20 NOTE — Interval H&P Note (Signed)
History and Physical Interval Note:  05/20/2022 11:21 AM  Joyce [name]  has presented today for surgery, with the diagnosis of LT Arm Fistulagram   ESRD.  The various methods of treatment have been discussed with the patient and family. After consideration of risks, benefits and other options for treatment, the patient has consented to  Procedure(s): A/V Fistulagram (Left) as a surgical intervention.  The patient's history has been reviewed, patient examined, no change in status, stable for surgery.  I have reviewed the patient's chart and labs.  Questions were answered to the patient's satisfaction.     [name]

## 2022-05-21 ENCOUNTER — Encounter: Payer: Self-pay | Admitting: Vascular Surgery

## 2022-07-08 ENCOUNTER — Encounter: Payer: Self-pay | Admitting: Cardiology

## 2022-07-08 ENCOUNTER — Ambulatory Visit: Payer: Medicare HMO | Attending: Cardiology | Admitting: Cardiology

## 2022-07-08 VITALS — BP 104/58 | HR 66 | Ht 63.0 in | Wt 145.4 lb

## 2022-07-08 DIAGNOSIS — I10 Essential (primary) hypertension: Secondary | ICD-10-CM

## 2022-07-08 DIAGNOSIS — I953 Hypotension of hemodialysis: Secondary | ICD-10-CM | POA: Diagnosis not present

## 2022-07-08 NOTE — Progress Notes (Signed)
Cardiology Office Note:    Date:  07/08/2022   ID:  Joyce [name], DOB May 07, 1946, MRN [id number]  PCP:  Orvis Brill, [facility]  Cardiologist:  Kate Sable, MD  Electrophysiologist:  None   Referring MD: [facility], [facility]*   Chief Complaint  Patient presents with   Follow-up    6 month f/u, wants to know if she has a heart problem    History of Present Illness:    Joyce [name] is a 76 y.o. female with a hx of GERD, hypertension, end-stage renal disease on dialysis MWF, hemodialysis induced hypotension who presents for follow-up.    Being seen for labile blood pressures, recently admitted to the hospital, BPs were significantly elevated.  Since then she has been placed on amlodipine, losartan.  Metoprolol stopped during dialysis for low blood pressures.  Takes 5 mg midodrine on dialysis days.  She otherwise feels okay, [name] further episodes of hypotension with dialysis.  [name] new concerns at this time.  Prior notes Lexiscan Myoview 08/2021 low risk study, [name] ischemia. Echo 08/2021 EF 60 to 65% Echocardiogram 05/2019 showed normal systolic function, EF 60 to 65%. Echo 5/00/9381 normal systolic function, EF 55 to 60%.  Grade 2 diastolic dysfunction. Cardiac monitor 11/13/2020, [name] significant arrhythmias to explain syncope, occasional SVTs.  Past Medical History:  Diagnosis Date   Anal fissure    Bipolar affective disorder ([place])    CKD (chronic kidney disease)    Dr Holley Raring [name] 4   Colon polyps    Diverticulitis    Diverticulitis    Family history of adverse reaction to anesthesia    mom - PONV   GERD (gastroesophageal reflux disease)    Headache    migraines - none over 10 yrs   Heart murmur    History of hiatal hernia    Hypertension    Pancreatitis    Valproic acid   Vertigo     Past Surgical History:  Procedure Laterality Date   A/V FISTULAGRAM Left 04/14/2017   Procedure: A/V Fistulagram;  Surgeon: Katha Cabal, MD;  Location: [place];  Service: Cardiovascular;  Laterality: Left;   A/V FISTULAGRAM Left 06/09/2017   Procedure: A/V FISTULAGRAM;  Surgeon: Katha Cabal, MD;  Location: [place] [facility];  Service: Cardiovascular;  Laterality: Left;   A/V FISTULAGRAM Left 11/02/2017   Procedure: A/V FISTULAGRAM;  Surgeon: Algernon Huxley, MD;  Location: [place];  Service: Cardiovascular;  Laterality: Left;   A/V FISTULAGRAM Left 12/22/2018   Procedure: A/V FISTULAGRAM;  Surgeon: Katha Cabal, MD;  Location: [place] [facility];  Service: Cardiovascular;  Laterality: Left;   A/V FISTULAGRAM Left 06/12/2020   Procedure: A/V FISTULAGRAM;  Surgeon: Katha Cabal, MD;  Location: [place] [facility];  Service: Cardiovascular;  Laterality: Left;   A/V FISTULAGRAM Left 01/22/2021   Procedure: A/V FISTULAGRAM;  Surgeon: Katha Cabal, MD;  Location: [place] [facility];  Service: Cardiovascular;  Laterality: Left;   A/V FISTULAGRAM Left 05/20/2022   Procedure: A/V Fistulagram;  Surgeon: Katha Cabal, MD;  Location: [facility];  Service: Cardiovascular;  Laterality: Left;   A/V SHUNT INTERVENTION N/A 11/02/2017   Procedure: A/V SHUNT INTERVENTION;  Surgeon: Algernon Huxley, MD;  Location: [facility] [facility];  Service: Cardiovascular;  Laterality: N/A;   ABDOMINAL HYSTERECTOMY  1990 ?   AV FISTULA PLACEMENT  4/30   BREAST EXCISIONAL BIOPSY Left 1994   neg surgical bx  CHOLECYSTECTOMY  2003   COLONOSCOPY  2014   Dr. Jamal Collin   COLONOSCOPY WITH PROPOFOL N/A 09/24/2015   Procedure: COLONOSCOPY WITH random colon byopies.;  Surgeon: Lucilla Lame, MD;  Location: [facility];  Service: Endoscopy;  Laterality: N/A;   ESOPHAGOGASTRODUODENOSCOPY (EGD) WITH PROPOFOL N/A 09/24/2015   Procedure: ESOPHAGOGASTRODUODENOSCOPY (EGD) ;  Surgeon: Lucilla Lame, MD;  Location: [place];  Service: Endoscopy;  Laterality: N/A;   ESOPHAGOGASTRODUODENOSCOPY (EGD) WITH PROPOFOL N/A  12/11/2020   Procedure: ESOPHAGOGASTRODUODENOSCOPY (EGD) WITH PROPOFOL;  Surgeon: Lucilla Lame, MD;  Location[facility];  Service: Endoscopy;  Laterality: N/A;   EYE SURGERY     PERIPHERAL VASCULAR CATHETERIZATION N/A 05/29/2015   Procedure: A/V Shuntogram/Fistulagram;  Surgeon: Katha Cabal, MD;  Location: [place] [facility];  Service: Cardiovascular;  Laterality: N/A;   PERIPHERAL VASCULAR CATHETERIZATION N/A 05/29/2015   Procedure: A/V Shunt Intervention;  Surgeon: Katha Cabal, MD;  Location: [place] [facility];  Service: Cardiovascular;  Laterality: N/A;   POLYPECTOMY  09/24/2015   Procedure: POLYPECTOMY INTESTINAL;  Surgeon: Lucilla Lame, MD;  Location: [place];  Service: Endoscopy;;  cecal polyp ascending polyp   REVISON OF ARTERIOVENOUS FISTULA Left 08/09/2021   Procedure: REVISON OF ARTERIOVENOUS FISTULA ( BRACHIAL CEPHALIC);  Surgeon: Katha Cabal, MD;  Location: [facility];  Service: Vascular;  Laterality: Left;   UPPER EXTREMITY ANGIOGRAM Left 08/09/2021   Procedure: UPPER EXTREMITY ANGIOGRAM ( POSSIBLE INTERVENTION);  Surgeon: Katha Cabal, MD;  Location: [facility];  Service: Vascular;  Laterality: Left;   UPPER EXTREMITY ANGIOGRAPHY Left 07/02/2021   Procedure: UPPER EXTREMITY ANGIOGRAPHY;  Surgeon: Katha Cabal, MD;  Location: [place] [facility];  Service: Cardiovascular;  Laterality: Left;    Current Medications: Current Meds  Medication Sig   acetaminophen (TYLENOL) 500 MG tablet Take 1,000 mg by mouth daily as needed for moderate pain.   amLODipine (NORVASC) 5 MG tablet Take 1 tablet (5 mg total) by mouth daily.   calcium acetate (PHOSLO) 667 MG capsule Take 667 mg by mouth 3 (three) times daily with meals.   carbamazepine (TEGRETOL) 100 MG chewable tablet Chew 100 mg by mouth 2 (two) times daily. (0600 & 2100)   Carboxymethylcellulose Sod PF 1 % GEL Place 3 drops into the right eye 4 (four) times daily.   dicyclomine (BENTYL) 10 MG  capsule Take 10 mg by mouth 4 (four) times daily -  before meals and at bedtime.   famotidine (PEPCID) 40 MG tablet Take 40 mg by mouth at bedtime. (2000)   fluticasone (FLONASE) 50 MCG/ACT nasal spray Place 1 spray into both nostrils in the morning. (0700)   haloperidol (HALDOL) 5 MG tablet TAKE 1/2 TABLET THREE TIMES WEEKLY ON MONDAY, WEDNESDAY AND FRIDAY  MORNING   levocetirizine (XYZAL) 5 MG tablet Take 5 mg by mouth daily.   lidocaine-prilocaine (EMLA) cream APPLY TO ACCESS AREA 30 MINUTES BEFORE DIALYSIS MONDAY / WEDNESDAY / FRIDAY   loratadine (CLARITIN) 10 MG tablet Take 10 mg by mouth daily.   losartan (COZAAR) 50 MG tablet Take 50 mg by mouth daily.   midodrine (PROAMATINE) 5 MG tablet Take 5 mg by mouth every Monday, Wednesday, and Friday with hemodialysis. Take one tablet po at start of dialysis PRN for hypotension   montelukast (SINGULAIR) 10 MG tablet Take 10 mg by mouth daily.   Netarsudil-Latanoprost (ROCKLATAN) 0.02-0.005 % SOLN INSTILL 1 DROP INTO EACH EYE AT BEDTIME   timolol (TIMOPTIC) 0.5 % ophthalmic solution Place 1  drop into both eyes 2 (two) times daily.   traZODone (DESYREL) 50 MG tablet Take 25 mg by mouth at bedtime.   vitamin B-12 (CYANOCOBALAMIN) 1000 MCG tablet Take 1,000 mcg by mouth in the morning. (0600)   ziprasidone (GEODON) 20 MG capsule Take 20 mg by mouth at bedtime. 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 ([place])   Walks independently.   Social Determinants of Health   Financial Resource Strain: Not on file  Food Insecurity: [name] Food Insecurity (12/22/2018)   Hunger Vital Sign    Worried About Running Out of Food in the Last Year: Never true    Ran Out of Food in the Last Year: Never true  Transportation Needs: [name] Transportation Needs (12/22/2018)   PRAPARE - Hydrologist (Medical): [name]    Lack of Transportation (Non-Medical): [name]  Physical Activity: Unknown (12/22/2018)   Exercise Vital Sign    Days of Exercise per Week: 7 days    Minutes of Exercise per Session: Not on file  Stress: Not on file  Social Connections: Unknown (12/22/2018)   Social Connection and Isolation Panel [NHANES]    Frequency of Communication with Friends and Family: More than three times a week    Frequency of Social Gatherings with Friends and Family: Not on file    Attends Religious Services: Not on file    Active Member of Clubs or Organizations: Not on file    Attends Archivist Meetings: Not on file    Marital Status: Divorced     Family History: The patient's family history includes Breast cancer in an other family member; Hypertension in her father; Stroke in her father. There is [name] history of Colon cancer, Liver disease, or Mental illness.  ROS:   Please see the history of present illness.     All other systems reviewed and are negative.  EKGs/Labs/Other Studies Reviewed:    The following studies were reviewed today:   EKG:  EKG is  ordered today.  The ekg ordered today demonstrates sinus rhythm, heart rate 66  Recent Labs: 08/30/2021: ALT 12 09/13/2021: Magnesium 1.4 10/15/2021: BUN 20; Creatinine, Ser 5.08; Hemoglobin 10.1; Platelets 235; Potassium 3.4; Sodium 136  Recent  Lipid Panel    Component Value Date/Time   CHOL 176 08/31/2021 0532   CHOL 105 05/14/2014 0412   TRIG 46 08/31/2021 0532   TRIG 127 05/14/2014 0412   HDL 65 08/31/2021 0532   HDL 37 (L) 05/14/2014 0412   CHOLHDL 2.7 08/31/2021 0532   VLDL 9 08/31/2021 0532   VLDL 25 05/14/2014 0258  LDLCALC 102 (H) 08/31/2021 0532   LDLCALC 43 05/14/2014 0412    Physical Exam:    VS:  BP (!) 104/58 (BP Location: Left Arm, Patient Position: Sitting, Cuff Size: Normal)   Pulse 66   Ht '5\' 3"'$  (1.6 m)   Wt 145 lb 6.4 oz (66 kg)   SpO2 98%   BMI 25.76 kg/m     Wt Readings from Last 3 Encounters:  07/08/22 145 lb 6.4 oz (66 kg)  05/20/22 138 lb (62.6 kg)  04/29/22 146 lb (66.2 kg)     GEN:  Well nourished, well developed in [name] acute distress HEENT: Normal NECK: [name] JVD; [name] carotid bruits CARDIAC: RRR, systolic murmur noted on exam, consistent with AV fistula present RESPIRATORY:  Clear to auscultation without rales, wheezing or rhonchi  ABDOMEN: Soft, non-tender, non-distended MUSCULOSKELETAL:  [name] edema; left arm AV fistula noted SKIN: Warm and dry NEUROLOGIC:  Alert and oriented x 3 PSYCHIATRIC:  Normal affect   ASSESSMENT:    1. Hemodialysis-associated hypotension   2. Labile essential hypertension    PLAN:    Hemodialysis induced hypotension.  Blood pressures have been stable with hemodialysis of late.  Beta-blocker stopped.  Takes midodrine 5 mg prior to dialysis.  This has been working effectively. Labile blood pressures.  Controlled on current dose of losartan, amlodipine.  [name] further hypotension with dialysis.  Continue medications as prescribed.  Further adjustments as per nephrology.  [name] active ongoing cardiac issues.  Follow-up as needed  This note was generated in part or whole with voice recognition software. Voice recognition is usually quite accurate but there are transcription errors that can and very often do occur. I apologize for any typographical errors that were not  detected and corrected.  Medication Adjustments/Labs and Tests Ordered: Current medicines are reviewed at length with the patient today.  Concerns regarding medicines are outlined above.  Orders Placed This Encounter  Procedures   EKG 12-Lead   [name] orders of the defined types were placed in this encounter.   Patient Instructions  Medication Instructions:   Your physician recommends that you continue on your current medications as directed. Please refer to the Current Medication list given to you today.  *If you need a refill on your cardiac medications before your next appointment, please call your pharmacy*   Lab Work:  None Ordered  If you have labs (blood work) drawn today and your tests are completely normal, you will receive your results only by: [place] (if you have MyChart) OR A paper copy in the mail If you have any lab test that is abnormal or we need to change your treatment, we will call you to review the results.   Testing/Procedures:  None Ordered   Follow-Up: At [facility], you and your health needs are our priority.  As part of our continuing mission to provide you with exceptional heart care, we have created designated Provider Care Teams.  These Care Teams include your primary Cardiologist (physician) and Advanced Practice Providers (APPs -  Physician Assistants and Nurse Practitioners) who all work together to provide you with the care you need, when you need it.  We recommend signing up for the patient portal called "MyChart".  Sign up information is provided on this After Visit Summary.  MyChart is used to connect with patients for Virtual Visits (Telemedicine).  Patients are able to view lab/test results, encounter notes, upcoming appointments, etc.  Non-urgent messages can be sent to your provider as well.  To learn more about what you can do with MyChart, go to [email or web address].    Your next appointment:   As needed        Signed, Kate Sable, MD  07/08/2022 10:25 AM    [place]

## 2022-07-08 NOTE — Patient Instructions (Addendum)
Medication Instructions:   Your physician recommends that you continue on your current medications as directed. Please refer to the Current Medication list given to you today.  *If you need a refill on your cardiac medications before your next appointment, please call your pharmacy*   Lab Work:  None Ordered  If you have labs (blood work) drawn today and your tests are completely normal, you will receive your results only by: MyChart Message (if you have MyChart) OR A paper copy in the mail If you have any lab test that is abnormal or we need to change your treatment, we will call you to review the results.   Testing/Procedures:  None Ordered   Follow-Up: At  [facility], you and your health needs are our priority.  As part of our continuing mission to provide you with exceptional heart care, we have created designated Provider Care Teams.  These Care Teams include your primary Cardiologist (physician) and Advanced Practice Providers (APPs -  Physician Assistants and Nurse Practitioners) who all work together to provide you with the care you need, when you need it.  We recommend signing up for the patient portal called "MyChart".  Sign up information is provided on this After Visit Summary.  MyChart is used to connect with patients for Virtual Visits (Telemedicine).  Patients are able to view lab/test results, encounter notes, upcoming appointments, etc.  Non-urgent messages can be sent to your provider as well.   To learn more about what you can do with MyChart, go to [email or web address].    Your next appointment:    As needed 

## 2022-08-05 ENCOUNTER — Emergency Department: Payer: Medicare HMO

## 2022-08-05 ENCOUNTER — Other Ambulatory Visit: Payer: Self-pay

## 2022-08-05 ENCOUNTER — Emergency Department
Admission: EM | Admit: 2022-08-05 | Discharge: 2022-08-05 | Disposition: A | Discharge status: Home / Self Care | Payer: Medicare HMO | Attending: Emergency Medicine | Admitting: Emergency Medicine

## 2022-08-05 DIAGNOSIS — Y92121 Bathroom in nursing home as the place of occurrence of the external cause: Secondary | ICD-10-CM | POA: Insufficient documentation

## 2022-08-05 DIAGNOSIS — Z992 Dependence on renal dialysis: Secondary | ICD-10-CM | POA: Insufficient documentation

## 2022-08-05 DIAGNOSIS — N186 End stage renal disease: Secondary | ICD-10-CM | POA: Insufficient documentation

## 2022-08-05 DIAGNOSIS — M546 Pain in thoracic spine: Secondary | ICD-10-CM | POA: Insufficient documentation

## 2022-08-05 DIAGNOSIS — W01198A Fall on same level from slipping, tripping and stumbling with subsequent striking against other object, initial encounter: Secondary | ICD-10-CM | POA: Diagnosis not present

## 2022-08-05 DIAGNOSIS — S0990XA Unspecified injury of head, initial encounter: Secondary | ICD-10-CM | POA: Insufficient documentation

## 2022-08-05 DIAGNOSIS — D631 Anemia in chronic kidney disease: Secondary | ICD-10-CM | POA: Insufficient documentation

## 2022-08-05 DIAGNOSIS — W19XXXA Unspecified fall, initial encounter: Secondary | ICD-10-CM

## 2022-08-05 LAB — URINALYSIS, ROUTINE W REFLEX MICROSCOPIC
Bacteria, UA: NONE SEEN
RBC / HPF: >50 RBC/hpf — ABNORMAL HIGH (ref 0–5)
Specific Gravity, Urine: 1.007 (ref 1.005–1.030)
WBC, UA: >50 WBC/hpf — ABNORMAL HIGH (ref 0–5)

## 2022-08-05 LAB — BASIC METABOLIC PANEL
Anion gap: 8 (ref 5–15)
BUN: 31 mg/dL — ABNORMAL HIGH (ref 8–23)
CO2: 29 mmol/L (ref 22–32)
Calcium: 9.1 mg/dL (ref 8.9–10.3)
Chloride: 98 mmol/L (ref 98–111)
Creatinine, Ser: 5.35 mg/dL — ABNORMAL HIGH (ref 0.44–1.00)
GFR, Estimated: 8 mL/min — ABNORMAL LOW (ref >60)
Glucose, Bld: 115 mg/dL — ABNORMAL HIGH (ref 70–99)
Potassium: 4.1 mmol/L (ref 3.5–5.1)
Sodium: 135 mmol/L (ref 135–145)

## 2022-08-05 LAB — CBC WITH DIFFERENTIAL/PLATELET
Abs Immature Granulocytes: 0.06 10*3/uL (ref 0.00–0.07)
Basophils Absolute: 0.1 10*3/uL (ref 0.0–0.1)
Basophils Relative: 1 %
Eosinophils Absolute: 0.1 10*3/uL (ref 0.0–0.5)
Eosinophils Relative: 1 %
HCT: 28.8 % — ABNORMAL LOW (ref 36.0–46.0)
Hemoglobin: 9.5 g/dL — ABNORMAL LOW (ref 12.0–15.0)
Immature Granulocytes: 1 %
Lymphocytes Relative: 15 %
Lymphs Abs: 1.6 10*3/uL (ref 0.7–4.0)
MCH: 32.9 pg (ref 26.0–34.0)
MCHC: 33 g/dL (ref 30.0–36.0)
MCV: 99.7 fL (ref 80.0–100.0)
Monocytes Absolute: 0.8 10*3/uL (ref 0.1–1.0)
Monocytes Relative: 7 %
Neutro Abs: 8.4 10*3/uL — ABNORMAL HIGH (ref 1.7–7.7)
Neutrophils Relative %: 75 %
Platelets: 217 10*3/uL (ref 150–400)
RBC: 2.89 MIL/uL — ABNORMAL LOW (ref 3.87–5.11)
RDW: 14 % (ref 11.5–15.5)
WBC: 11 10*3/uL — ABNORMAL HIGH (ref 4.0–10.5)
nRBC: 0 % (ref 0.0–0.2)

## 2022-08-05 MED ORDER — ONDANSETRON 4 MG PO TBDP
4.0000 mg | ORAL_TABLET | Freq: Once | ORAL | Status: AC
  Administered 2022-08-05: 4 mg via ORAL
  Filled 2022-08-05: qty 1

## 2022-08-05 MED ORDER — FOSFOMYCIN TROMETHAMINE 3 G PO PACK
3.0000 g | PACK | Freq: Once | ORAL | Status: AC
  Administered 2022-08-05: 3 g via ORAL
  Filled 2022-08-05: qty 3

## 2022-08-05 MED ORDER — LIDOCAINE 5 % EX PTCH: 1.0000 | MEDICATED_PATCH | Freq: Two times a day (BID) | CUTANEOUS | 0 refills | Status: AC

## 2022-08-05 MED ORDER — ACETAMINOPHEN 500 MG PO TABS
1000.0000 mg | ORAL_TABLET | Freq: Once | ORAL | Status: AC
  Administered 2022-08-05: 1000 mg via ORAL
  Filled 2022-08-05: qty 2

## 2022-08-05 MED ORDER — LIDOCAINE 5 % EX PTCH
1.0000 | MEDICATED_PATCH | CUTANEOUS | Status: DC
  Administered 2022-08-05: 1 via TRANSDERMAL
  Filled 2022-08-05: qty 1

## 2022-08-05 MED ORDER — LIDOCAINE 5 % EX PTCH: 1.0000 | MEDICATED_PATCH | Freq: Two times a day (BID) | CUTANEOUS | 0 refills | Status: DC

## 2022-08-05 NOTE — Discharge Instructions (Addendum)
Use Tylenol for pain and fevers.  Up to 1000 mg per dose, up to 4 times per day.  Do not take more than 4000 mg of Tylenol/acetaminophen within 24 hours..  Please use lidocaine patches at your site of pain.  Apply 1 patch at a time, leave on for 12 hours, then remove for 12 hours.  12 hours on, 12 hours off.  Do not apply more than 1 patch at a time.  Her urine was bloody and sent for a urine culture. She was given one dose of Fosfomycin antibiotic here.

## 2022-08-05 NOTE — ED Provider Notes (Signed)
[facility] Provider Note    Event Date/Time   First MD Initiated Contact with Patient 08/05/22 [phone]     (approximate)   History   Fall   HPI  Nicie Milan is a 77 y.o. female who presents to the ED for evaluation of Fall   I reviewed vascular surgery clinic visit from 10/10.  ESRD on hemodialysis via a fistula.    Patient presents to the ED from her local SNF for evaluation of a fall.  Reports going to the restroom.  She thought she had passed a bowel movement, but was unable to do so, so stood up to go back to bed when she felt dizzy and fell back and struck her head.  Uncertain if she passed out.   Reports feeling fine prior to this episode.  Reports pain down to the right side of her thoracic back as well as the back of her head.   Physical Exam   Triage Vital Signs: ED Triage Vitals  Enc Vitals Group     BP      Pulse      Resp      Temp      Temp src      SpO2      Weight      Height      Head Circumference      Peak Flow      Pain Score      Pain Loc      Pain Edu?      Excl. in [place]?     Most recent vital signs: Vitals:   08/05/22 0330 08/05/22 0629  BP: 113/86 (!) 116/48  Pulse: 65 66  Resp: 18 18  Temp: (!) 97.4 F (36.3 C) (!) 97.5 F (36.4 C)  SpO2: 100% 100%    General: Awake, no distress.  Able to transfer independently from the EMS stretcher to our own.  Looks well. CV:  Good peripheral perfusion.  Resp:  Normal effort.  Abd:  No distention.  MSK:  No deformity noted.  Fistula to the left upper extremity with palpable thrill.  Distally intact. Palpation of all 4 extremities without signs of trauma, tenderness or deformity. No spinal step-offs, and has some vague and poorly localizing tenderness throughout the thoracic/lumbar back on the right side Neuro:  No focal deficits appreciated. Cranial nerves II through XII intact 5/5 strength and sensation in all 4 extremities Other:     ED Results / Procedures /  Treatments   Labs (all labs ordered are listed, but only abnormal results are displayed) Labs Reviewed  CBC WITH DIFFERENTIAL/PLATELET - Abnormal; Notable for the following components:      Result Value   WBC 11.0 (*)    RBC 2.89 (*)    Hemoglobin 9.5 (*)    HCT 28.8 (*)    Neutro Abs 8.4 (*)    All other components within normal limits  BASIC METABOLIC PANEL - Abnormal; Notable for the following components:   Glucose, Bld 115 (*)    BUN 31 (*)    Creatinine, Ser 5.35 (*)    GFR, Estimated 8 (*)    All other components within normal limits  URINALYSIS, ROUTINE W REFLEX MICROSCOPIC    EKG Sinus rhythm with a rate of 61 bpm.  Leftward axis and normal intervals.  No acute signs of acute ischemia.  RADIOLOGY CXR interpreted by me without evidence of acute cardiopulmonary pathology. CT head interpreted by me  without evidence of acute intracranial pathology CT cervical spine interpreted by me without evidence of fracture or dislocation CT thoracic spine interpreted by me without evidence of fracture or dislocation  Official radiology report(s): DG Chest 2 View  Result Date: 08/05/2022 CLINICAL DATA:  Fall with right-sided pain EXAM: CHEST - 2 VIEW COMPARISON:  10/15/2021 FINDINGS: Normal heart size and mediastinal contours. No acute infiltrate or edema. No effusion or pneumothorax. No acute osseous findings. Remote healed right seventh rib fracture. Extensive stenting in the left arm and axilla, history of AV fistula. IMPRESSION: No evidence of active disease or acute injury. Electronically Signed   By: Jorje Guild M.D.   On: 08/05/2022 04:20   CT Thoracic Spine Wo Contrast  Result Date: 08/05/2022 CLINICAL DATA:  Fall with right-sided body pain EXAM: CT THORACIC AND LUMBAR SPINE WITHOUT CONTRAST TECHNIQUE: Multidetector CT imaging of the thoracic and lumbar spine was performed without contrast. Multiplanar CT image reconstructions were also generated. RADIATION DOSE REDUCTION:  This exam was performed according to the departmental dose-optimization program which includes automated exposure control, adjustment of the mA and/or kV according to patient size and/or use of iterative reconstruction technique. COMPARISON:  None Available. FINDINGS: CT THORACIC SPINE FINDINGS Alignment: Exaggerated thoracic kyphosis. No traumatic malalignment or focal listhesis Vertebrae: Motion degraded visualization especially at T1 and T2, but these levels are also seen on cervical spine CT. No acute fracture Paraspinal and other soft tissues: No visible injury. Disc levels: Ordinary disc space narrowing and endplate spurring. No bony impingement. CT LUMBAR SPINE FINDINGS Segmentation: 5 lumbar type vertebrae. Alignment: Normal. Vertebrae: No acute fracture or focal pathologic process. Paraspinal and other soft tissues: Polycystic kidneys in this patient with chronic renal failure. 5 cm left ovarian cystic density, 4.2 cm maximal in 2020. Colonic diverticulosis. Disc levels: Ordinary degenerative changes without visible impingement. IMPRESSION: 1. No evidence of acute injury to the thoracic or lumbar spine. 2. Slowly growing left ovarian cyst now measuring 5 cm. Because this lesion is not adequately characterized, prompt [place] is recommended for further evaluation. Note: This recommendation does not apply to premenarchal patients and to those with increased risk (genetic, family history, elevated tumor markers or other high-risk factors) of ovarian cancer. Reference: JACR 2020 Feb; 17(2):248-254 Electronically Signed   By: Jorje Guild M.D.   On: 08/05/2022 04:15   CT Lumbar Spine Wo Contrast  Result Date: 08/05/2022 CLINICAL DATA:  Fall with right-sided body pain EXAM: CT THORACIC AND LUMBAR SPINE WITHOUT CONTRAST TECHNIQUE: Multidetector CT imaging of the thoracic and lumbar spine was performed without contrast. Multiplanar CT image reconstructions were also generated. RADIATION DOSE REDUCTION: This exam  was performed according to the departmental dose-optimization program which includes automated exposure control, adjustment of the mA and/or kV according to patient size and/or use of iterative reconstruction technique. COMPARISON:  None Available. FINDINGS: CT THORACIC SPINE FINDINGS Alignment: Exaggerated thoracic kyphosis. No traumatic malalignment or focal listhesis Vertebrae: Motion degraded visualization especially at T1 and T2, but these levels are also seen on cervical spine CT. No acute fracture Paraspinal and other soft tissues: No visible injury. Disc levels: Ordinary disc space narrowing and endplate spurring. No bony impingement. CT LUMBAR SPINE FINDINGS Segmentation: 5 lumbar type vertebrae. Alignment: Normal. Vertebrae: No acute fracture or focal pathologic process. Paraspinal and other soft tissues: Polycystic kidneys in this patient with chronic renal failure. 5 cm left ovarian cystic density, 4.2 cm maximal in 2020. Colonic diverticulosis. Disc levels: Ordinary degenerative changes without visible impingement. IMPRESSION: 1.  No evidence of acute injury to the thoracic or lumbar spine. 2. Slowly growing left ovarian cyst now measuring 5 cm. Because this lesion is not adequately characterized, prompt [place] is recommended for further evaluation. Note: This recommendation does not apply to premenarchal patients and to those with increased risk (genetic, family history, elevated tumor markers or other high-risk factors) of ovarian cancer. Reference: JACR 2020 Feb; 17(2):248-254 Electronically Signed   By: Jorje Guild M.D.   On: 08/05/2022 04:15   CT HEAD WO CONTRAST (5MM)  Result Date: 08/05/2022 CLINICAL DATA:  Fall with head injury EXAM: CT HEAD WITHOUT CONTRAST CT CERVICAL SPINE WITHOUT CONTRAST TECHNIQUE: Multidetector CT imaging of the head and cervical spine was performed following the standard protocol without intravenous contrast. Multiplanar CT image reconstructions of the cervical spine  were also generated. RADIATION DOSE REDUCTION: This exam was performed according to the departmental dose-optimization program which includes automated exposure control, adjustment of the mA and/or kV according to patient size and/or use of iterative reconstruction technique. COMPARISON:  10/15/2021 FINDINGS: CT HEAD FINDINGS Brain: No evidence of acute infarction, hemorrhage, hydrocephalus, extra-axial collection or mass lesion/mass effect. Vascular: No hyperdense vessel or unexpected calcification. Skull: Normal. Negative for fracture or focal lesion. Sinuses/Orbits: No evidence of injury CT CERVICAL SPINE FINDINGS Alignment: Normal. Skull base and vertebrae: No acute fracture. No primary bone lesion or focal pathologic process. Incomplete posterior arch fusion at C1, developmental Soft tissues and spinal canal: No prevertebral fluid or swelling. No visible canal hematoma. Disc levels:  No acute finding Upper chest: Clear apical lungs IMPRESSION: No evidence of intracranial or cervical spine injury. Electronically Signed   By: Jorje Guild M.D.   On: 08/05/2022 04:10   CT Cervical Spine Wo Contrast  Result Date: 08/05/2022 CLINICAL DATA:  Fall with head injury EXAM: CT HEAD WITHOUT CONTRAST CT CERVICAL SPINE WITHOUT CONTRAST TECHNIQUE: Multidetector CT imaging of the head and cervical spine was performed following the standard protocol without intravenous contrast. Multiplanar CT image reconstructions of the cervical spine were also generated. RADIATION DOSE REDUCTION: This exam was performed according to the departmental dose-optimization program which includes automated exposure control, adjustment of the mA and/or kV according to patient size and/or use of iterative reconstruction technique. COMPARISON:  10/15/2021 FINDINGS: CT HEAD FINDINGS Brain: No evidence of acute infarction, hemorrhage, hydrocephalus, extra-axial collection or mass lesion/mass effect. Vascular: No hyperdense vessel or unexpected  calcification. Skull: Normal. Negative for fracture or focal lesion. Sinuses/Orbits: No evidence of injury CT CERVICAL SPINE FINDINGS Alignment: Normal. Skull base and vertebrae: No acute fracture. No primary bone lesion or focal pathologic process. Incomplete posterior arch fusion at C1, developmental Soft tissues and spinal canal: No prevertebral fluid or swelling. No visible canal hematoma. Disc levels:  No acute finding Upper chest: Clear apical lungs IMPRESSION: No evidence of intracranial or cervical spine injury. Electronically Signed   By: Jorje Guild M.D.   On: 08/05/2022 04:10    PROCEDURES and INTERVENTIONS:  Procedures  Medications  lidocaine (LIDODERM) 5 % 1 patch (1 patch Transdermal Patch Applied 08/05/22 0409)  acetaminophen (TYLENOL) tablet 1,000 mg (1,000 mg Oral Given 08/05/22 0533)  ondansetron (ZOFRAN-ODT) disintegrating tablet 4 mg (4 mg Oral Given 08/05/22 0409)     IMPRESSION / MDM / ASSESSMENT AND PLAN / ED COURSE  I reviewed the triage vital signs and the nursing notes.  Differential diagnosis includes, but is not limited to, syncope, seizure, ICH, sepsis, symptomatic anemia, MSK strain or spasm  {Patient presents with symptoms  of an acute illness or injury that is potentially life-threatening.  77 year old woman presents to the ED from a SNF after a fall.  Sounds mechanical in nature, that she questions syncope.  Blood work is obtained with stigmata of ESRD without emergent indications for dialysis.  Chronic anemia of disease is noted without acute changes.  Imaging is reassuring, as above.  Awaiting UA around the time of signout as she does still make urine and reports a couple days of blood with her urine without dysuria, frequency or other symptoms.  She is signed out to oncoming provider to assess for cystitis.  She is already ambulated and has controlled pain.  Anticipate she will be suitable for return to her facility.  Clinical Course as of 08/05/22 0649  Tue  Aug 05, 2022  0457 Reassessed. Reports feeling better. Discussed reassuring imaging. She reports knowing about ovarian cysts. does still make urine. Needs some water before providing a urine sample [DS]  [phone] Patient up and ambulatory to the restroom to provide a urine sample.  No assistance required. [DS]    Clinical Course User Index [DS] Vladimir Crofts, MD     FINAL CLINICAL IMPRESSION(S) / ED DIAGNOSES   Final diagnoses:  Fall, initial encounter  Acute right-sided thoracic back pain     Rx / DC Orders   ED Discharge Orders          Ordered    lidocaine (LIDODERM) 5 %  Every 12 hours        08/05/22 0647             Note:  This document was prepared using Dragon voice recognition software and may include unintentional dictation errors.   Vladimir Crofts, MD 08/05/22 [phone]

## 2022-08-05 NOTE — ED Notes (Signed)
Attempted report to [facility]; left this RN's name and ascom number with Network engineer.

## 2022-08-05 NOTE — ED Triage Notes (Signed)
Patient states that she was on the toilet, stood up, and passed out.  She says that she fell and hit her head on the trash can in the bathroom, and hit the floor with the right side of her body.  Patient is complaining that the pain on her side is worse than her head.  Patient states she is not on a blood thinner.

## 2022-08-05 NOTE — ED Notes (Signed)
Patient transported to CT 

## 2022-08-05 NOTE — ED Notes (Signed)
Pt has her personal cell phone; pt called Richard to see if he can be her ride back to [facility].

## 2022-08-05 NOTE — ED Notes (Signed)
Patient given saltine crackers and gingerale ?

## 2022-08-05 NOTE — ED Notes (Signed)
Called lab to add on urine culture; staff stated they will do so now.

## 2022-08-05 NOTE — ED Notes (Signed)
Pt on her personal phone. Pt in NAD.

## 2022-08-05 NOTE — ED Notes (Signed)
Patient able to ambulate to bathroom

## 2022-08-05 NOTE — ED Notes (Signed)
Patient returned from CT

## 2022-08-05 NOTE — ED Notes (Signed)
Wheelchair used to help pt to vehicle.

## 2022-08-06 LAB — URINE CULTURE

## 2023-02-05 ENCOUNTER — Telehealth (INDEPENDENT_AMBULATORY_CARE_PROVIDER_SITE_OTHER): Payer: Self-pay | Admitting: Nurse Practitioner

## 2023-02-05 NOTE — Telephone Encounter (Signed)
[facility] called in stating that patient was inquiring if patient needed to come in and have an appointment. Its been a while since she was last seen. Nothing is going on and she is fine. Patient just wanted to make sure she isn't missing any follow up appointments      Please advise

## 2023-02-05 NOTE — Telephone Encounter (Signed)
It's time to look at there fistula with an HDA.  It's not stat but it's been nearly a year and we usually check every 6 months

## 2023-03-31 ENCOUNTER — Other Ambulatory Visit (INDEPENDENT_AMBULATORY_CARE_PROVIDER_SITE_OTHER): Payer: Self-pay | Admitting: Nurse Practitioner

## 2023-03-31 DIAGNOSIS — N186 End stage renal disease: Secondary | ICD-10-CM

## 2023-04-01 NOTE — Progress Notes (Signed)
MRN : [id number]  Joyce [name] is a 77 y.o. (03-14-1946) female who presents with chief complaint of check access.  History of Present Illness:   The patient returns to the office for followup of their dialysis access.   The patient reports the function of the access has been stable. Patient denies difficulty with cannulation. The patient denies increased bleeding time after removing the needles. The patient denies hand pain or other symptoms consistent with steal phenomena.  No significant arm swelling.  The patient denies any complaints from the dialysis center or their nephrologist.  The patient denies redness or swelling at the access site. The patient denies fever or chills at home or while on dialysis.  No recent shortening of the patient's walking distance or new symptoms consistent with claudication.  No history of rest pain symptoms. No new ulcers or wounds of the lower extremities have occurred.  The patient denies amaurosis fugax or recent TIA symptoms. There are no recent neurological changes noted. There is no history of DVT, PE or superficial thrombophlebitis. No recent episodes of angina or shortness of breath documented.   Duplex ultrasound of the AV access shows a patent access.  The previously noted stenosis is not significantly changed compared to last study.  Flow volume today is 1403 cc/min (previous flow volume was 2823 cc/min)    No outpatient medications have been marked as taking for the 04/02/23 encounter (Appointment) with Gilda Crease, Latina Craver, MD.    Past Medical History:  Diagnosis Date   Anal fissure    Bipolar affective disorder (HCC)    CKD (chronic kidney disease)    Dr Cherylann Ratel Lenise [facility] 4   Colon polyps    Diverticulitis    Diverticulitis    Family history of adverse reaction to anesthesia    mom - PONV   GERD (gastroesophageal reflux disease)    Headache    migraines - none over 10 yrs   Heart murmur    History of  hiatal hernia    Hypertension    Pancreatitis    Valproic acid   Vertigo     Past Surgical History:  Procedure Laterality Date   A/V FISTULAGRAM Left 04/14/2017   Procedure: A/V Fistulagram;  Surgeon: Renford Dills, MD;  Location: [facility];  Service: Cardiovascular;  Laterality: Left;   A/V FISTULAGRAM Left 06/09/2017   Procedure: A/V FISTULAGRAM;  Surgeon: Renford Dills, MD;  Location: [facility];  Service: Cardiovascular;  Laterality: Left;   A/V FISTULAGRAM Left 11/02/2017   Procedure: A/V FISTULAGRAM;  Surgeon: Annice Needy, MD;  Location: [facility];  Service: Cardiovascular;  Laterality: Left;   A/V FISTULAGRAM Left 12/22/2018   Procedure: A/V FISTULAGRAM;  Surgeon: Renford Dills, MD;  Location: [facility];  Service: Cardiovascular;  Laterality: Left;   A/V FISTULAGRAM Left 06/12/2020   Procedure: A/V FISTULAGRAM;  Surgeon: Renford Dills, MD;  Location: [facility];  Service: Cardiovascular;  Laterality: Left;   A/V FISTULAGRAM Left 01/22/2021   Procedure: A/V FISTULAGRAM;  Surgeon: Renford Dills, MD;  Location: [facility];  Service: Cardiovascular;  Laterality: Left;   A/V FISTULAGRAM Left 05/20/2022   Procedure: A/V Fistulagram;  Surgeon: Renford Dills, MD;  Location: [facility];  Service: Cardiovascular;  Laterality: Left;   A/V SHUNT INTERVENTION N/A 11/02/2017  Procedure: A/V SHUNT INTERVENTION;  Surgeon: Annice Needy, MD;  Location: [facility];  Service: Cardiovascular;  Laterality: N/A;   ABDOMINAL HYSTERECTOMY  1990 ?   AV FISTULA PLACEMENT  4/30   BREAST EXCISIONAL BIOPSY Left 1994   neg surgical bx   CHOLECYSTECTOMY  2003   COLONOSCOPY  2014   Dr. Evette Cristal   COLONOSCOPY WITH PROPOFOL N/A 09/24/2015   Procedure: COLONOSCOPY WITH random colon byopies.;  Surgeon: Midge Minium, MD;  Location: [facility];  Service: Endoscopy;  Laterality: N/A;    ESOPHAGOGASTRODUODENOSCOPY (EGD) WITH PROPOFOL N/A 09/24/2015   Procedure: ESOPHAGOGASTRODUODENOSCOPY (EGD) ;  Surgeon: Midge Minium, MD;  Location: [facility];  Service: Endoscopy;  Laterality: N/A;   ESOPHAGOGASTRODUODENOSCOPY (EGD) WITH PROPOFOL N/A 12/11/2020   Procedure: ESOPHAGOGASTRODUODENOSCOPY (EGD) WITH PROPOFOL;  Surgeon: Midge Minium, MD;  Location: [facility];  Service: Endoscopy;  Laterality: N/A;   EYE SURGERY     PERIPHERAL VASCULAR CATHETERIZATION N/A 05/29/2015   Procedure: A/V Shuntogram/Fistulagram;  Surgeon: Renford Dills, MD;  Location: [facility];  Service: Cardiovascular;  Laterality: N/A;   PERIPHERAL VASCULAR CATHETERIZATION N/A 05/29/2015   Procedure: A/V Shunt Intervention;  Surgeon: Renford Dills, MD;  Location: [facility];  Service: Cardiovascular;  Laterality: N/A;   POLYPECTOMY  09/24/2015   Procedure: POLYPECTOMY INTESTINAL;  Surgeon: Midge Minium, MD;  Location: [facility];  Service: Endoscopy;;  cecal polyp ascending polyp   REVISON OF ARTERIOVENOUS FISTULA Left 08/09/2021   Procedure: REVISON OF ARTERIOVENOUS FISTULA ( BRACHIAL CEPHALIC);  Surgeon: Renford Dills, MD;  Location: [facility];  Service: Vascular;  Laterality: Left;   UPPER EXTREMITY ANGIOGRAM Left 08/09/2021   Procedure: UPPER EXTREMITY ANGIOGRAM ( POSSIBLE INTERVENTION);  Surgeon: Renford Dills, MD;  Location: [facility];  Service: Vascular;  Laterality: Left;   UPPER EXTREMITY ANGIOGRAPHY Left 07/02/2021   Procedure: UPPER EXTREMITY ANGIOGRAPHY;  Surgeon: Renford Dills, MD;  Location: [facility];  Service: Cardiovascular;  Laterality: Left;    Social History Social History   Tobacco Use   Smoking status: Never    Passive exposure: Never   Smokeless tobacco: Never  Vaping Use   Vaping status: Never Used  Substance Use Topics   Alcohol use: No    Alcohol/week: 0.0 standard drinks of alcohol   Drug use: No    Family  History Family History  Problem Relation Age of Onset   Stroke Father    Hypertension Father    Breast cancer Other    Colon cancer Neg Hx    Liver disease Neg Hx    Mental illness Neg Hx     Allergies  Allergen Reactions   Morphine And Codeine Shortness Of Breath    Pt reports chest pain and difficulty breathing.   Gabapentin     PER MAR   Indomethacin Hives   Penicillins Other (See Comments)    PER MAR   Pollen Extract Other (See Comments)    Sinus problems and HA     REVIEW OF SYSTEMS (Negative unless checked)  Constitutional: [] Weight loss  [] Fever  [] Chills Cardiac: [] Chest pain   [] Chest pressure   [] Palpitations   [] Shortness of breath when laying flat   [] Shortness of breath with exertion. Vascular:  [] Pain in legs with walking   [] Pain in legs at rest  [] History of DVT   [] Phlebitis   [] Swelling in legs   [] Varicose veins   [] Non-healing ulcers Pulmonary:   [] Uses  home oxygen   [] Productive cough   [] Hemoptysis   [] Wheeze  [] COPD   [] Asthma Neurologic:  [] Dizziness   [] Seizures   [] History of stroke   [] History of TIA  [] Aphasia   [] Vissual changes   [] Weakness or numbness in arm   [] Weakness or numbness in leg Musculoskeletal:   [] Joint swelling   [] Joint pain   [] Low back pain Hematologic:  [] Easy bruising  [] Easy bleeding   [] Hypercoagulable state   [] Anemic Gastrointestinal:  [] Diarrhea   [] Vomiting  [x] Gastroesophageal reflux/heartburn   [] Difficulty swallowing. Genitourinary:  [x] Chronic kidney disease   [] Difficult urination  [] Frequent urination   [] Blood in urine Skin:  [] Rashes   [] Ulcers  Psychological:  [] History of anxiety   []  History of major depression.  Physical Examination  There were no vitals filed for this visit. There is no height or weight on file to calculate BMI. Gen: WD/WN, NAD Head: [place]/AT, No temporalis wasting.  Ear/Nose/Throat: Hearing grossly intact, nares w/o erythema or drainage Eyes: PER, EOMI, sclera nonicteric.  Neck: Supple,  no gross masses or lesions.  No JVD.  Pulmonary:  Good air movement, no audible wheezing, no use of accessory muscles.  Cardiac: RRR, precordium non-hyperdynamic. Vascular:   Left brachiocephalic fistula good thrill good bruit Vessel Right Left  Radial Palpable Palpable  Brachial Palpable Palpable  Gastrointestinal: soft, non-distended. No guarding/no peritoneal signs.  Musculoskeletal: M/S 5/5 throughout.  No deformity.  Neurologic: CN 2-12 intact. Pain and light touch intact in extremities.  Symmetrical.  Speech is fluent. Motor exam as listed above. Psychiatric: Judgment intact, Mood & affect appropriate for pt's clinical situation. Dermatologic: No rashes or ulcers noted.  No changes consistent with cellulitis.   CBC Lab Results  Component Value Date   WBC 11.0 (H) 08/05/2022   HGB 9.5 (L) 08/05/2022   HCT 28.8 (L) 08/05/2022   MCV 99.7 08/05/2022   PLT 217 08/05/2022    BMET    Component Value Date/Time   NA 135 08/05/2022 0410   NA 143 10/23/2020 0930   NA 139 11/09/2014 1027   K 4.1 08/05/2022 0410   K 4.7 11/09/2014 1027   CL 98 08/05/2022 0410   CL 108 11/09/2014 1027   CO2 29 08/05/2022 0410   CO2 25 11/09/2014 1027   GLUCOSE 115 (H) 08/05/2022 0410   GLUCOSE 100 (H) 11/09/2014 1027   BUN 31 (H) 08/05/2022 0410   BUN 24 10/23/2020 0930   BUN 51 (H) 11/09/2014 1027   CREATININE 5.35 (H) 08/05/2022 0410   CREATININE 2.98 (H) 11/09/2014 1027   CALCIUM 9.1 08/05/2022 0410   CALCIUM 8.9 11/09/2014 1027   GFRNONAA 8 (L) 08/05/2022 0410   GFRNONAA 15 (L) 11/09/2014 1027   GFRAA 11 (L) 05/24/2019 0513   GFRAA 18 (L) 11/09/2014 1027   CrCl cannot be calculated (Patient's most recent lab result is older than the maximum 21 days allowed.).  COAG Lab Results  Component Value Date   INR 1.2 08/30/2021   INR 1.08 10/03/2016   INR 1.0 11/09/2014    Radiology No results found.   Assessment/Plan: 1. ESRD on dialysis [facility]) Recommend:  The patient is doing  well and currently has adequate dialysis access. The patient's dialysis center is not reporting any access issues. Flow pattern is stable when compared to the prior ultrasound.  The patient should have a duplex ultrasound of the dialysis access in 6 months. The patient will follow-up with me in the office after each ultrasound   -  VAS US DUPLEX DIALYSIS ACCESS (AVF, AVG); Future  2. Primary hypertension Continue antihypertensive medications as already ordered, these medications have been reviewed and there are no changes at this time.  3. NSTEMI (non-ST elevated myocardial infarction) (HCC) Continue cardiac and antihypertensive medications as already ordered and reviewed, no changes at this time.  Continue statin as ordered and reviewed, no changes at this time  Nitrates PRN for chest pain  4. Gastro-esophageal reflux disease without esophagitis Continue PPI as already ordered, this medication has been reviewed and there are no changes at this time.  Avoidence of caffeine and alcohol  Moderate elevation of the head of the bed    Levora Dredge, MD  04/01/2023 4:37 PM

## 2023-04-02 ENCOUNTER — Ambulatory Visit (INDEPENDENT_AMBULATORY_CARE_PROVIDER_SITE_OTHER): Payer: Medicare HMO

## 2023-04-02 ENCOUNTER — Ambulatory Visit (INDEPENDENT_AMBULATORY_CARE_PROVIDER_SITE_OTHER): Payer: Medicare HMO | Admitting: Vascular Surgery

## 2023-04-02 VITALS — BP 125/51 | HR 61 | Resp 19 | Ht 63.0 in | Wt 142.0 lb

## 2023-04-02 DIAGNOSIS — I214 Non-ST elevation (NSTEMI) myocardial infarction: Secondary | ICD-10-CM

## 2023-04-02 DIAGNOSIS — K219 Gastro-esophageal reflux disease without esophagitis: Secondary | ICD-10-CM

## 2023-04-02 DIAGNOSIS — Z992 Dependence on renal dialysis: Secondary | ICD-10-CM

## 2023-04-02 DIAGNOSIS — I10 Essential (primary) hypertension: Secondary | ICD-10-CM

## 2023-04-02 DIAGNOSIS — N186 End stage renal disease: Secondary | ICD-10-CM

## 2023-04-05 ENCOUNTER — Encounter (INDEPENDENT_AMBULATORY_CARE_PROVIDER_SITE_OTHER): Payer: Self-pay | Admitting: Vascular Surgery

## 2023-04-17 ENCOUNTER — Other Ambulatory Visit
Admission: RE | Admit: 2023-04-17 | Discharge: 2023-04-17 | Disposition: A | Discharge status: Home / Self Care | Payer: Medicare HMO | Source: Ambulatory Visit | Attending: Nephrology | Admitting: Nephrology

## 2023-04-17 DIAGNOSIS — R109 Unspecified abdominal pain: Secondary | ICD-10-CM | POA: Insufficient documentation

## 2023-04-19 LAB — URINE CULTURE: Culture: NO GROWTH

## 2023-09-29 NOTE — Progress Notes (Deleted)
 MRN : [id number]  Joyce [name] is a 78 y.o. (1945-10-21) female who presents with chief complaint of check access.  History of Present Illness:   The patient returns to the office for followup of their dialysis access.    The patient reports the function of the access has been stable. Patient denies difficulty with cannulation. The patient denies increased bleeding time after removing the needles. The patient denies hand pain or other symptoms consistent with steal phenomena.  No significant arm swelling.   The patient denies any complaints from the dialysis center or their nephrologist.   The patient denies redness or swelling at the access site. The patient denies fever or chills at home or while on dialysis.   No recent shortening of the patient's walking distance or new symptoms consistent with claudication.  No history of rest pain symptoms. No new ulcers or wounds of the lower extremities have occurred.   The patient denies amaurosis fugax or recent TIA symptoms. There are no recent neurological changes noted. There is no history of DVT, PE or superficial thrombophlebitis. No recent episodes of angina or shortness of breath documented.    Duplex ultrasound of the AV access shows a patent access.  The previously noted stenosis is not significantly changed compared to last study.  Flow volume today is 1403 cc/min (previous flow volume was 2823 cc/min)  No outpatient medications have been marked as taking for the 10/01/23 encounter (Appointment) with Gilda Crease, Latina Craver, MD.    Past Medical History:  Diagnosis Date   Anal fissure    Bipolar affective disorder (HCC)    CKD (chronic kidney disease)    Dr Cherylann Ratel Lenise Herald 4   Colon polyps    Diverticulitis    Diverticulitis    Family history of adverse reaction to anesthesia    mom - PONV   GERD (gastroesophageal reflux disease)    Headache    migraines - none over 10 yrs   Heart murmur    History of  hiatal hernia    Hypertension    Pancreatitis    Valproic acid   Vertigo     Past Surgical History:  Procedure Laterality Date   A/V FISTULAGRAM Left 04/14/2017   Procedure: A/V Fistulagram;  Surgeon: Renford Dills, MD;  Location: [facility];  Service: Cardiovascular;  Laterality: Left;   A/V FISTULAGRAM Left 06/09/2017   Procedure: A/V FISTULAGRAM;  Surgeon: Renford Dills, MD;  Location: [facility];  Service: Cardiovascular;  Laterality: Left;   A/V FISTULAGRAM Left 11/02/2017   Procedure: A/V FISTULAGRAM;  Surgeon: Annice Needy, MD;  Location: [facility];  Service: Cardiovascular;  Laterality: Left;   A/V FISTULAGRAM Left 12/22/2018   Procedure: A/V FISTULAGRAM;  Surgeon: Renford Dills, MD;  Location: [facility];  Service: Cardiovascular;  Laterality: Left;   A/V FISTULAGRAM Left 06/12/2020   Procedure: A/V FISTULAGRAM;  Surgeon: Renford Dills, MD;  Location: [facility];  Service: Cardiovascular;  Laterality: Left;   A/V FISTULAGRAM Left 01/22/2021   Procedure: A/V FISTULAGRAM;  Surgeon: Renford Dills, MD;  Location: [facility];  Service: Cardiovascular;  Laterality: Left;   A/V FISTULAGRAM Left 05/20/2022   Procedure: A/V Fistulagram;  Surgeon: Renford Dills, MD;  Location: [facility];  Service: Cardiovascular;  Laterality: Left;   A/V  SHUNT INTERVENTION N/A 11/02/2017   Procedure: A/V SHUNT INTERVENTION;  Surgeon: Annice Needy, MD;  Location: [facility];  Service: Cardiovascular;  Laterality: N/A;   ABDOMINAL HYSTERECTOMY  1990 ?   AV FISTULA PLACEMENT  4/30   BREAST EXCISIONAL BIOPSY Left 1994   neg surgical bx   CHOLECYSTECTOMY  2003   COLONOSCOPY  2014   Dr. Evette Cristal   COLONOSCOPY WITH PROPOFOL N/A 09/24/2015   Procedure: COLONOSCOPY WITH random colon byopies.;  Surgeon: Midge Minium, MD;  Location: [facility];  Service: Endoscopy;  Laterality: N/A;    ESOPHAGOGASTRODUODENOSCOPY (EGD) WITH PROPOFOL N/A 09/24/2015   Procedure: ESOPHAGOGASTRODUODENOSCOPY (EGD) ;  Surgeon: Midge Minium, MD;  Location: [facility];  Service: Endoscopy;  Laterality: N/A;   ESOPHAGOGASTRODUODENOSCOPY (EGD) WITH PROPOFOL N/A 12/11/2020   Procedure: ESOPHAGOGASTRODUODENOSCOPY (EGD) WITH PROPOFOL;  Surgeon: Midge Minium, MD;  Location: [facility];  Service: Endoscopy;  Laterality: N/A;   EYE SURGERY     PERIPHERAL VASCULAR CATHETERIZATION N/A 05/29/2015   Procedure: A/V Shuntogram/Fistulagram;  Surgeon: Renford Dills, MD;  Location: [facility];  Service: Cardiovascular;  Laterality: N/A;   PERIPHERAL VASCULAR CATHETERIZATION N/A 05/29/2015   Procedure: A/V Shunt Intervention;  Surgeon: Renford Dills, MD;  Location: [facility];  Service: Cardiovascular;  Laterality: N/A;   POLYPECTOMY  09/24/2015   Procedure: POLYPECTOMY INTESTINAL;  Surgeon: Midge Minium, MD;  Location: [facility];  Service: Endoscopy;;  cecal polyp ascending polyp   REVISON OF ARTERIOVENOUS FISTULA Left 08/09/2021   Procedure: REVISON OF ARTERIOVENOUS FISTULA ( BRACHIAL CEPHALIC);  Surgeon: Renford Dills, MD;  Location: [facility];  Service: Vascular;  Laterality: Left;   UPPER EXTREMITY ANGIOGRAM Left 08/09/2021   Procedure: UPPER EXTREMITY ANGIOGRAM ( POSSIBLE INTERVENTION);  Surgeon: Renford Dills, MD;  Location: [facility];  Service: Vascular;  Laterality: Left;   UPPER EXTREMITY ANGIOGRAPHY Left 07/02/2021   Procedure: UPPER EXTREMITY ANGIOGRAPHY;  Surgeon: Renford Dills, MD;  Location: [facility];  Service: Cardiovascular;  Laterality: Left;    Social History Social History   Tobacco Use   Smoking status: Never    Passive exposure: Never   Smokeless tobacco: Never  Vaping Use   Vaping status: Never Used  Substance Use Topics   Alcohol use: No    Alcohol/week: 0.0 standard drinks of alcohol   Drug use: No    Family  History Family History  Problem Relation Age of Onset   Stroke Father    Hypertension Father    Breast cancer Other    Colon cancer Neg Hx    Liver disease Neg Hx    Mental illness Neg Hx     Allergies  Allergen Reactions   Morphine And Codeine Shortness Of Breath    Pt reports chest pain and difficulty breathing.   Gabapentin     PER MAR   Indomethacin Hives   Penicillins Other (See Comments)    PER MAR   Pollen Extract Other (See Comments)    Sinus problems and HA     REVIEW OF SYSTEMS (Negative unless checked)  Constitutional: [] Weight loss  [] Fever  [] Chills Cardiac: [] Chest pain   [] Chest pressure   [] Palpitations   [] Shortness of breath when laying flat   [] Shortness of breath with exertion. Vascular:  [] Pain in legs with walking   [] Pain in legs at rest  [] History of DVT   [] Phlebitis   [] Swelling in legs   [] Varicose veins   []   Non-healing ulcers Pulmonary:   [] Uses home oxygen   [] Productive cough   [] Hemoptysis   [] Wheeze  [] COPD   [] Asthma Neurologic:  [] Dizziness   [] Seizures   [] History of stroke   [] History of TIA  [] Aphasia   [] Vissual changes   [] Weakness or numbness in arm   [] Weakness or numbness in leg Musculoskeletal:   [] Joint swelling   [] Joint pain   [] Low back pain Hematologic:  [] Easy bruising  [] Easy bleeding   [] Hypercoagulable state   [] Anemic Gastrointestinal:  [] Diarrhea   [] Vomiting  [x] Gastroesophageal reflux/heartburn   [] Difficulty swallowing. Genitourinary:  [x] Chronic kidney disease   [] Difficult urination  [] Frequent urination   [] Blood in urine Skin:  [] Rashes   [] Ulcers  Psychological:  [] History of anxiety   []  History of major depression.  Physical Examination  There were no vitals filed for this visit. There is no height or weight on file to calculate BMI. Gen: WD/WN, NAD Head: [place]/AT, No temporalis wasting.  Ear/Nose/Throat: Hearing grossly intact, nares w/o erythema or drainage Eyes: PER, EOMI, sclera nonicteric.  Neck: Supple,  no gross masses or lesions.  No JVD.  Pulmonary:  Good air movement, no audible wheezing, no use of accessory muscles.  Cardiac: RRR, precordium non-hyperdynamic. Vascular:   *** Vessel Right Left  Radial Palpable Palpable  Brachial Palpable Palpable  Gastrointestinal: soft, non-distended. No guarding/no peritoneal signs.  Musculoskeletal: M/S 5/5 throughout.  No deformity.  Neurologic: CN 2-12 intact. Pain and light touch intact in extremities.  Symmetrical.  Speech is fluent. Motor exam as listed above. Psychiatric: Judgment intact, Mood & affect appropriate for pt's clinical situation. Dermatologic: No rashes or ulcers noted.  No changes consistent with cellulitis.   CBC Lab Results  Component Value Date   WBC 11.0 (H) 08/05/2022   HGB 9.5 (L) 08/05/2022   HCT 28.8 (L) 08/05/2022   MCV 99.7 08/05/2022   PLT 217 08/05/2022    BMET    Component Value Date/Time   NA 135 08/05/2022 0410   NA 143 10/23/2020 0930   NA 139 11/09/2014 1027   K 4.1 08/05/2022 0410   K 4.7 11/09/2014 1027   CL 98 08/05/2022 0410   CL 108 11/09/2014 1027   CO2 29 08/05/2022 0410   CO2 25 11/09/2014 1027   GLUCOSE 115 (H) 08/05/2022 0410   GLUCOSE 100 (H) 11/09/2014 1027   BUN 31 (H) 08/05/2022 0410   BUN 24 10/23/2020 0930   BUN 51 (H) 11/09/2014 1027   CREATININE 5.35 (H) 08/05/2022 0410   CREATININE 2.98 (H) 11/09/2014 1027   CALCIUM 9.1 08/05/2022 0410   CALCIUM 8.9 11/09/2014 1027   GFRNONAA 8 (L) 08/05/2022 0410   GFRNONAA 15 (L) 11/09/2014 1027   GFRAA 11 (L) 05/24/2019 0513   GFRAA 18 (L) 11/09/2014 1027   CrCl cannot be calculated (Patient's most recent lab result is older than the maximum 21 days allowed.).  COAG Lab Results  Component Value Date   INR 1.2 08/30/2021   INR 1.08 10/03/2016   INR 1.0 11/09/2014    Radiology No results found.   Assessment/Plan There are no diagnoses linked to this encounter.   Levora Dredge, MD  09/29/2023 9:37 AM

## 2023-10-01 ENCOUNTER — Ambulatory Visit (INDEPENDENT_AMBULATORY_CARE_PROVIDER_SITE_OTHER): Payer: Medicare HMO | Admitting: Vascular Surgery

## 2023-10-01 ENCOUNTER — Ambulatory Visit (INDEPENDENT_AMBULATORY_CARE_PROVIDER_SITE_OTHER): Payer: Medicare HMO

## 2023-10-01 DIAGNOSIS — I214 Non-ST elevation (NSTEMI) myocardial infarction: Secondary | ICD-10-CM

## 2023-10-01 DIAGNOSIS — Z992 Dependence on renal dialysis: Secondary | ICD-10-CM

## 2023-10-01 DIAGNOSIS — I10 Essential (primary) hypertension: Secondary | ICD-10-CM

## 2023-10-01 DIAGNOSIS — N186 End stage renal disease: Secondary | ICD-10-CM

## 2023-10-01 DIAGNOSIS — K219 Gastro-esophageal reflux disease without esophagitis: Secondary | ICD-10-CM

## 2023-12-08 ENCOUNTER — Emergency Department

## 2023-12-08 ENCOUNTER — Encounter (INDEPENDENT_AMBULATORY_CARE_PROVIDER_SITE_OTHER): Payer: Self-pay

## 2023-12-08 ENCOUNTER — Emergency Department
Admission: EM | Admit: 2023-12-08 | Discharge: 2023-12-08 | Disposition: A | Discharge status: Skilled Nursing Facility | Attending: Emergency Medicine | Admitting: Emergency Medicine

## 2023-12-08 ENCOUNTER — Other Ambulatory Visit: Payer: Self-pay

## 2023-12-08 DIAGNOSIS — I700 Atherosclerosis of aorta: Secondary | ICD-10-CM | POA: Insufficient documentation

## 2023-12-08 DIAGNOSIS — S12031D Nondisplaced posterior arch fracture of first cervical vertebra, subsequent encounter for fracture with routine healing: Secondary | ICD-10-CM | POA: Insufficient documentation

## 2023-12-08 DIAGNOSIS — M542 Cervicalgia: Secondary | ICD-10-CM | POA: Diagnosis present

## 2023-12-08 DIAGNOSIS — S0990XA Unspecified injury of head, initial encounter: Secondary | ICD-10-CM | POA: Diagnosis not present

## 2023-12-08 DIAGNOSIS — W1830XA Fall on same level, unspecified, initial encounter: Secondary | ICD-10-CM | POA: Diagnosis not present

## 2023-12-08 DIAGNOSIS — I2510 Atherosclerotic heart disease of native coronary artery without angina pectoris: Secondary | ICD-10-CM | POA: Insufficient documentation

## 2023-12-08 DIAGNOSIS — M546 Pain in thoracic spine: Secondary | ICD-10-CM | POA: Insufficient documentation

## 2023-12-08 MED ORDER — LIDOCAINE 5 % EX PTCH
1.0000 | MEDICATED_PATCH | CUTANEOUS | Status: DC
  Administered 2023-12-08: 1 via TRANSDERMAL
  Filled 2023-12-08: qty 1

## 2023-12-08 MED ORDER — HYDROCODONE-ACETAMINOPHEN 5-325 MG PO TABS
1.0000 | ORAL_TABLET | Freq: Once | ORAL | Status: AC
  Administered 2023-12-08: 1 via ORAL
  Filled 2023-12-08: qty 1

## 2023-12-08 NOTE — ED Notes (Signed)
 Report given to [place], med tech at [facility].

## 2023-12-08 NOTE — Discharge Instructions (Signed)
 Your imaging today showed a small fracture in your neck.  You can wear your collar for comfort, but it is okay to remove this as needed.  Please follow-up with the spine specialist in a couple weeks for further evaluation.  Return to the ER for new or worsening symptoms.

## 2023-12-08 NOTE — ED Notes (Signed)
 First nurse note: Pt presents via EMS from [facility]. Reports fell x5 days ago and has upper back pain unrelieved by Tylenol . Imaging completed at facility but EMS reports results have not be resulted as of now.

## 2023-12-08 NOTE — ED Notes (Addendum)
 [place] J c-collar placed. Neurovascular intact p placement.

## 2023-12-08 NOTE — ED Notes (Signed)
 Pt to radiology at this time.

## 2023-12-08 NOTE — ED Notes (Signed)
 [facility]  will  transport  pt

## 2023-12-08 NOTE — ED Provider Notes (Signed)
 [facility] Provider Note    Event Date/Time   First MD Initiated Contact with Patient 12/08/23 0402     (approximate)   History   Fall   HPI  Joyce [name] is a 78 year old female presenting to the ER for evaluation of back pain.  5 days ago, patient was on the toilet when she lost her balance and fell.  Reports she did hit the front of her head.  No LOC.  No anticoagulation.  Since that time she has continued to have neck and upper back pain.  Reportedly had x-rays done at her facility, unknown results.  No numbness, tingling, focal weakness.     Physical Exam   Triage Vital Signs: ED Triage Vitals  Encounter Vitals Group     BP 12/08/23 0104 120/60     Systolic BP Percentile --      Diastolic BP Percentile --      Pulse Rate 12/08/23 0104 63     Resp 12/08/23 0104 18     Temp 12/08/23 0104 98.5 F (36.9 C)     Temp Source 12/08/23 0415 Oral     SpO2 12/08/23 0104 98 %     Weight 12/08/23 0103 146 lb (66.2 kg)     Height 12/08/23 0103 5\' 3"  (1.6 m)     Head Circumference --      Peak Flow --      Pain Score 12/08/23 0103 8     Pain Loc --      Pain Education --      Exclude from Growth Chart --     Most recent vital signs: Vitals:   12/08/23 0104 12/08/23 0415  BP: 120/60 (!) 127/90  Pulse: 63 68  Resp: 18 18  Temp: 98.5 F (36.9 C) 97.8 F (36.6 C)  SpO2: 98% 98%    Nursing notes and vital signs reviewed.  General: Adult female, laying in bed, awake and interactive Head: Atraumatic Neck: Tenderness palpation over the cervical and thoracic spine, no tenderness of the lower midline spine Chest: Symmetric chest rise, no anterior tenderness to palpation, but tenderness is noted over the upper back on the right along the rib spaces without focal area of point tenderness Cardiac: Regular rhythm and rate.  Respiratory: Lungs clear to auscultation Abdomen: Soft, nondistended. No tenderness to palpation.  Pelvis: Stable in AP and  lateral compression. No tenderness to palpation. MSK: No deformity to bilateral upper and lower extremity. Full range of motion to bilateral upper lower extremity. Neuro: Alert, oriented. GCS 15. 5 out of 5 strength in bilateral upper and lower extremities. Normal sensation to light touch in bilateral upper and lower extremity. Skin: No evidence of burns or lacerations.  ED Results / Procedures / Treatments   Labs (all labs ordered are listed, but only abnormal results are displayed) Labs Reviewed - No data to display   EKG EKG independently reviewed interpreted by myself (ER attending) demonstrates:  EKG demonstrates normal sinus with a rate of 64, PR 174, QRS 94, QTc 431, no acute ST changes  RADIOLOGY Imaging independently reviewed and interpreted by myself demonstrates:  X-rays of the cervical and thoracic spine without acute fracture CT chest without acute traumatic injuries CT T-spine without acute fracture CT cervical spine demonstrates a C1 posterior arch fracture on the right, radiology notes that this may be subacute  Formal Radiology Read:  CT Chest Wo Contrast Result Date: 12/08/2023 CLINICAL DATA:  Chest trauma. Fall. Complains of  posterior rib and right upper back pain. EXAM: CT CHEST WITHOUT CONTRAST TECHNIQUE: Multidetector CT imaging of the chest was performed following the standard protocol without IV contrast. RADIATION DOSE REDUCTION: This exam was performed according to the departmental dose-optimization program which includes automated exposure control, adjustment of the mA and/or kV according to patient size and/or use of iterative reconstruction technique. COMPARISON:  12/14/2020 FINDINGS: Cardiovascular: Heart size is upper limits of normal. Small pericardial effusion is similar to the previous exam. Aortic atherosclerosis and coronary artery calcifications. Mediastinum/Nodes: No enlarged mediastinal or axillary lymph nodes. Thyroid  gland, trachea, and esophagus  demonstrate no significant findings. Lungs/Pleura: No pneumothorax. No pleural effusion or airspace consolidation. No signs of atelectasis. The central airways appear patent. Calcified granuloma identified within the left upper lobe, image 52/4. Upper Abdomen: Scattered liver cysts are again noted. The largest is along the dome of right lobe measuring 1.5 cm. Bilateral polycystic kidneys with numerous cysts of varying complexity are noted within both kidneys. These are incompletely characterized without IV contrast material. The largest cyst arises off the upper pole of right kidney measuring 4.1 cm. Cholecystectomy. Musculoskeletal: There are remote fracture deformities involving the proximal body of sternum and inferior body of the right scapula. The thoracic vertebral body heights are all well maintained. No rib fractures. IMPRESSION: 1. No acute findings within the chest. 2. Remote fracture deformities involving the proximal body of sternum and inferior body of the right scapula. 3. Bilateral polycystic kidneys with numerous cysts of varying complexity are noted within both kidneys. These are incompletely characterized without IV contrast material. 4. Coronary artery calcifications. 5.  Aortic Atherosclerosis (ICD10-I70.0). Electronically Signed   By: Kimberley Penman M.D.   On: 12/08/2023 05:25   CT T-SPINE NO CHARGE Result Date: 12/08/2023 CLINICAL DATA:  Fall 5 days ago. EXAM: CT Thoracic Spine without contrast TECHNIQUE: Multiplanar CT images of the thoracic spine were reconstructed from contemporary CT of the Chest. RADIATION DOSE REDUCTION: This exam was performed according to the departmental dose-optimization program which includes automated exposure control, adjustment of the mA and/or kV according to patient size and/or use of iterative reconstruction technique. CONTRAST:  None additional COMPARISON:  None Available. FINDINGS: Alignment: Exaggerated thoracic kyphosis without listhesis. Vertebrae:  Subjective generalized osteopenia. No evidence of acute fracture or aggressive bone lesion. Paraspinal and other soft tissues: No evidence of perispinal mass or inflammation. Partial coverage of polycystic kidneys. Chest findings from source imaging reported separately. Disc levels: Generalized disc space narrowing with mild endplate and facet spurring. No bony impingement. IMPRESSION: No evidence of acute injury in the thoracic spine. Electronically Signed   By: Ronnette Coke M.D.   On: 12/08/2023 05:20   CT Head Wo Contrast Result Date: 12/08/2023 CLINICAL DATA:  Head trauma EXAM: CT HEAD WITHOUT CONTRAST CT CERVICAL SPINE WITHOUT CONTRAST TECHNIQUE: Multidetector CT imaging of the head and cervical spine was performed following the standard protocol without intravenous contrast. Multiplanar CT image reconstructions of the cervical spine were also generated. RADIATION DOSE REDUCTION: This exam was performed according to the departmental dose-optimization program which includes automated exposure control, adjustment of the mA and/or kV according to patient size and/or use of iterative reconstruction technique. COMPARISON:  08/05/2022 FINDINGS: CT HEAD FINDINGS Brain: No evidence of acute infarction, hemorrhage, hydrocephalus, extra-axial collection or mass lesion/mass effect. Chronic expansion of extra-axial CSF density without discrete collection or significant change. Vascular: No hyperdense vessel or unexpected calcification. Skull: Normal. Negative for fracture or focal lesion. Sinuses/Orbits: No evidence of injury Other:  Intermittent motion artifact. CT CERVICAL SPINE FINDINGS Alignment: No traumatic malalignment. Skull base and vertebrae: Fracture through the right posterior arch of C1 since prior. Margins appear somewhat corticated. Developmentally discontinuous posterior arch. Soft tissues and spinal canal: No prevertebral fluid or swelling. No visible canal hematoma. Disc levels:  No acute finding  Upper chest: Clear apical lungs. Prelim sent in epic chat. IMPRESSION: Nondisplaced C1 posterior arch fracture on the right, possibly subacute based on fracture margins, new since 08/05/2022. The posterior arch of C1 is developmentally discontinuous at baseline. No evidence of intracranial injury. Motion artifact. Electronically Signed   By: Ronnette Coke M.D.   On: 12/08/2023 05:09   CT Cervical Spine Wo Contrast Result Date: 12/08/2023 CLINICAL DATA:  Head trauma EXAM: CT HEAD WITHOUT CONTRAST CT CERVICAL SPINE WITHOUT CONTRAST TECHNIQUE: Multidetector CT imaging of the head and cervical spine was performed following the standard protocol without intravenous contrast. Multiplanar CT image reconstructions of the cervical spine were also generated. RADIATION DOSE REDUCTION: This exam was performed according to the departmental dose-optimization program which includes automated exposure control, adjustment of the mA and/or kV according to patient size and/or use of iterative reconstruction technique. COMPARISON:  08/05/2022 FINDINGS: CT HEAD FINDINGS Brain: No evidence of acute infarction, hemorrhage, hydrocephalus, extra-axial collection or mass lesion/mass effect. Chronic expansion of extra-axial CSF density without discrete collection or significant change. Vascular: No hyperdense vessel or unexpected calcification. Skull: Normal. Negative for fracture or focal lesion. Sinuses/Orbits: No evidence of injury Other: Intermittent motion artifact. CT CERVICAL SPINE FINDINGS Alignment: No traumatic malalignment. Skull base and vertebrae: Fracture through the right posterior arch of C1 since prior. Margins appear somewhat corticated. Developmentally discontinuous posterior arch. Soft tissues and spinal canal: No prevertebral fluid or swelling. No visible canal hematoma. Disc levels:  No acute finding Upper chest: Clear apical lungs. Prelim sent in epic chat. IMPRESSION: Nondisplaced C1 posterior arch fracture on the  right, possibly subacute based on fracture margins, new since 08/05/2022. The posterior arch of C1 is developmentally discontinuous at baseline. No evidence of intracranial injury. Motion artifact. Electronically Signed   By: Ronnette Coke M.D.   On: 12/08/2023 05:09   DG Thoracic Spine 2 View Result Date: 12/08/2023 CLINICAL DATA:  Fall, upper back pain EXAM: THORACIC SPINE 2 VIEWS COMPARISON:  Thoracic spine CT 08/05/2022 FINDINGS: Osseous structures are mildly osteopenic. Accentuated thoracic kyphosis is again identified. No acute fracture of the thoracic spine. Vertebral body height is preserved. Disc space narrowing and endplate remodeling is seen within the midthoracic spine in keeping with changes of degenerative disc disease. Paraspinal soft tissues are unremarkable. IMPRESSION: 1. No acute fracture or subluxation. 2. Degenerative disc disease. Electronically Signed   By: Worthy Heads M.D.   On: 12/08/2023 02:46   DG Cervical Spine 2-3 Views Result Date: 12/08/2023 CLINICAL DATA:  Fall, back pain EXAM: CERVICAL SPINE - 2-3 VIEW COMPARISON:  CT 08/05/2022 FINDINGS: The posterior arch of C1 is incomplete and appears slightly dysmorphic, unchanged from prior CT examination. The cervical spine is visualized through C6 on lateral examination. C7-T1 is not visualized due to overlying osseous structures. Normal cervical lordosis. No acute fracture or listhesis of the visualized cervical spine. Prevertebral soft tissues are not thickened. Spinal canal is widely patent. IMPRESSION: 1. No acute fracture or listhesis of the visualized cervical spine. Note that C7-T1 is not visualized. 2. Incomplete posterior arch of C1, unchanged from prior CT examination. Electronically Signed   By: Worthy Heads M.D.   On: 12/08/2023 02:45  PROCEDURES:  Critical Care performed: No  Procedures   MEDICATIONS ORDERED IN ED: Medications  lidocaine  (LIDODERM ) 5 % 1 patch (1 patch Transdermal Patch Applied 12/08/23  0430)  HYDROcodone -acetaminophen  (NORCO/VICODIN) 5-325 MG per tablet 1 tablet (1 tablet Oral Given 12/08/23 0429)     IMPRESSION / MDM / ASSESSMENT AND PLAN / ED COURSE  I reviewed the triage vital signs and the nursing notes.  Differential diagnosis includes, but is not limited to, intracranial bleed, spine fracture, rib fracture, no evidence of abdominal trauma  Patient's presentation is most consistent with acute presentation with potential threat to life or bodily function.  78 year old female presenting after a fall with ongoing back pain.  Stable vitals on presentation.  EKG reassuring.  Trauma imaging notable for C1 posterior arch fracture.  Will discuss with neurosurgery.  Clinical Course as of 12/08/23 0604  Tue Dec 08, 2023  1610 Case reviewed with Dr. Felipe Horton with neurosurgery.  He notes that the posterior arch is not an unstable fracture, but notes that patient can wear a c-collar for comfort and follow-up as an outpatient with neurosurgery in a few weeks. [NR]    Clinical Course User Index [NR] Claria Crofts, MD   Patient updated on plan for discharge and outpatient follow-up.  She is comfortable with this.  Strict return precautions advised.  Patient discharged stable condition.   FINAL CLINICAL IMPRESSION(S) / ED DIAGNOSES   Final diagnoses:  Closed nondisplaced fracture of posterior arch of first cervical vertebra with routine healing, subsequent encounter  Acute thoracic back pain, unspecified back pain laterality     Rx / DC Orders   ED Discharge Orders     None        Note:  This document was prepared using Dragon voice recognition software and may include unintentional dictation errors.   Claria Crofts, MD 12/08/23 [id number]

## 2023-12-08 NOTE — ED Notes (Signed)
 Called [facility] spoke with Lonzell Robin to transport patient to [facility] said it would be about 2 hours

## 2023-12-22 NOTE — Progress Notes (Addendum)
 Referring Physician:  [facility] [address] [place],  [place] [address]  Primary Physician:  [facility]  History of Present Illness: 12/24/2023 Joyce [name] has a history of HTN, NSTEMI, GERD, gastritis, ESRD on dialysis, bipolar, RLS.   Seen in ED on 12/08/23 and found to have C1 posterior arch fracture s/p fall on 12/03/23. Advised to wear collar for comfort.   She has not been wearing her collar, it is too small. She has intermittent right sided neck pain. No radiation to her arms. No numbness, tingling or weakness in her arms or legs. Neck pain is improving. She is back to her normal activities.   She does not smoke.   No bowel/bladder issues.  Review of Systems:  A 10 point review of systems is negative, except for the pertinent positives and negatives detailed in the HPI.  Past Medical History: Past Medical History:  Diagnosis Date   Anal fissure    Bipolar affective disorder (HCC)    CKD (chronic kidney disease)    Dr Rhesa Celeste Anise Kerns 4   Colon polyps    Diverticulitis    Diverticulitis    Family history of adverse reaction to anesthesia    mom - PONV   GERD (gastroesophageal reflux disease)    Headache    migraines - none over 10 yrs   Heart murmur    History of hiatal hernia    Hypertension    Pancreatitis    Valproic acid   Vertigo     Past Surgical History: Past Surgical History:  Procedure Laterality Date   A/V FISTULAGRAM Left 04/14/2017   Procedure: A/V Fistulagram;  Surgeon: Jackquelyn Mass, MD;  Location: [facility];  Service: Cardiovascular;  Laterality: Left;   A/V FISTULAGRAM Left 06/09/2017   Procedure: A/V FISTULAGRAM;  Surgeon: Jackquelyn Mass, MD;  Location: [facility];  Service: Cardiovascular;  Laterality: Left;   A/V FISTULAGRAM Left 11/02/2017   Procedure: A/V FISTULAGRAM;  Surgeon: Celso College, MD;  Location: [facility];  Service: Cardiovascular;   Laterality: Left;   A/V FISTULAGRAM Left 12/22/2018   Procedure: A/V FISTULAGRAM;  Surgeon: Jackquelyn Mass, MD;  Location: [facility];  Service: Cardiovascular;  Laterality: Left;   A/V FISTULAGRAM Left 06/12/2020   Procedure: A/V FISTULAGRAM;  Surgeon: Jackquelyn Mass, MD;  Location: [facility];  Service: Cardiovascular;  Laterality: Left;   A/V FISTULAGRAM Left 01/22/2021   Procedure: A/V FISTULAGRAM;  Surgeon: Jackquelyn Mass, MD;  Location: [facility];  Service: Cardiovascular;  Laterality: Left;   A/V FISTULAGRAM Left 05/20/2022   Procedure: A/V Fistulagram;  Surgeon: Jackquelyn Mass, MD;  Location: [facility];  Service: Cardiovascular;  Laterality: Left;   A/V SHUNT INTERVENTION N/A 11/02/2017   Procedure: A/V SHUNT INTERVENTION;  Surgeon: Celso College, MD;  Location: [facility];  Service: Cardiovascular;  Laterality: N/A;   ABDOMINAL HYSTERECTOMY  1990 ?   AV FISTULA PLACEMENT  4/30   BREAST EXCISIONAL BIOPSY Left 1994   neg surgical bx   CHOLECYSTECTOMY  2003   COLONOSCOPY  2014   Dr. Lorel Roes   COLONOSCOPY WITH PROPOFOL  N/A 09/24/2015   Procedure: COLONOSCOPY WITH random colon byopies.;  Surgeon: Marnee Sink, MD;  Location: [facility];  Service: Endoscopy;  Laterality: N/A;   ESOPHAGOGASTRODUODENOSCOPY (EGD) WITH PROPOFOL  N/A 09/24/2015   Procedure: ESOPHAGOGASTRODUODENOSCOPY (EGD) ;  Surgeon: Marnee Sink, MD;  Location: [facility];  Service: Endoscopy;  Laterality: N/A;   ESOPHAGOGASTRODUODENOSCOPY (EGD) WITH PROPOFOL  N/A 12/11/2020   Procedure: ESOPHAGOGASTRODUODENOSCOPY (EGD) WITH PROPOFOL ;  Surgeon: Marnee Sink, MD;  Location: [facility];  Service: Endoscopy;  Laterality: N/A;   EYE SURGERY     PERIPHERAL VASCULAR CATHETERIZATION N/A 05/29/2015   Procedure: A/V Shuntogram/Fistulagram;  Surgeon: Jackquelyn Mass, MD;  Location: [facility];  Service: Cardiovascular;  Laterality: N/A;   PERIPHERAL  VASCULAR CATHETERIZATION N/A 05/29/2015   Procedure: A/V Shunt Intervention;  Surgeon: Jackquelyn Mass, MD;  Location: [facility];  Service: Cardiovascular;  Laterality: N/A;   POLYPECTOMY  09/24/2015   Procedure: POLYPECTOMY INTESTINAL;  Surgeon: Marnee Sink, MD;  Location: [facility];  Service: Endoscopy;;  cecal polyp ascending polyp   REVISON OF ARTERIOVENOUS FISTULA Left 08/09/2021   Procedure: REVISON OF ARTERIOVENOUS FISTULA ( BRACHIAL CEPHALIC);  Surgeon: Jackquelyn Mass, MD;  Location: [facility];  Service: Vascular;  Laterality: Left;   UPPER EXTREMITY ANGIOGRAM Left 08/09/2021   Procedure: UPPER EXTREMITY ANGIOGRAM ( POSSIBLE INTERVENTION);  Surgeon: Jackquelyn Mass, MD;  Location: [facility];  Service: Vascular;  Laterality: Left;   UPPER EXTREMITY ANGIOGRAPHY Left 07/02/2021   Procedure: UPPER EXTREMITY ANGIOGRAPHY;  Surgeon: Jackquelyn Mass, MD;  Location: [facility];  Service: Cardiovascular;  Laterality: Left;    Allergies: Allergies as of 12/24/2023 - Review Complete 12/24/2023  Allergen Reaction Noted   Morphine  and codeine Shortness Of Breath 10/09/2016   Gabapentin   01/26/2019   Indomethacin Hives 05/10/2013   Penicillins Other (See Comments) 01/16/2021   Pollen extract Other (See Comments) 12/25/2014    Medications: Outpatient Encounter Medications as of 12/24/2023  Medication Sig   acetaminophen  (TYLENOL ) 500 MG tablet Take 1,000 mg by mouth daily as needed for moderate pain.   ALPRAZolam  (XANAX ) 0.25 MG tablet Take by mouth.   Azelastine HCl 137 MCG/SPRAY SOLN Place into both nostrils.   BIOFREEZE 10 % CREA SMARTSIG:liberally Topical 3 Times Daily   brimonidine  (ALPHAGAN ) 0.2 % ophthalmic solution Place 1 drop into both eyes 2 (two) times daily.   calcium  acetate (PHOSLO ) 667 MG capsule Take 667 mg by mouth 3 (three) times daily with meals.   carbamazepine  (TEGRETOL ) 100 MG chewable tablet Chew 100 mg by mouth 2 (two) times daily.  (0600 & 2100)   Cholecalciferol  (VITAMIN D3) 50 MCG (2000 UT) capsule Take 2,000 Units by mouth daily.   diclofenac  Sodium (VOLTAREN ) 1 % GEL Apply 2 g topically 3 (three) times daily as needed (pain).   dicyclomine  (BENTYL ) 10 MG capsule Take 10 mg by mouth 4 (four) times daily -  before meals and at bedtime.   famotidine  (PEPCID ) 40 MG tablet Take 40 mg by mouth at bedtime. (2000)   fluticasone  (FLONASE ) 50 MCG/ACT nasal spray Place 1 spray into both nostrils in the morning. (0700)   haloperidol  (HALDOL ) 5 MG tablet TAKE 1/2 TABLET THREE TIMES WEEKLY ON MONDAY, WEDNESDAY AND FRIDAY  MORNING   latanoprost  (XALATAN ) 0.005 % ophthalmic solution SMARTSIG:In Eye(s)   levocetirizine (XYZAL) 5 MG tablet Take 5 mg by mouth daily.   lidocaine -prilocaine  (EMLA ) cream APPLY TO ACCESS AREA 30 MINUTES BEFORE DIALYSIS MONDAY / WEDNESDAY / FRIDAY   loratadine  (CLARITIN ) 10 MG tablet Take 10 mg by mouth daily.   losartan (COZAAR) 50 MG tablet Take 50 mg by mouth daily.   midodrine  (PROAMATINE ) 5 MG tablet Take 5 mg by mouth every Monday, Wednesday, and Friday with  hemodialysis. Take one tablet po at start of dialysis PRN for hypotension   montelukast  (SINGULAIR ) 10 MG tablet Take 10 mg by mouth daily.   ondansetron  (ZOFRAN ) 4 MG tablet Take by mouth.   REFRESH TEARS 0.5 % SOLN SMARTSIG:1 Drop(s) In Eye(s) 3 Times Daily PRN   rOPINIRole  (REQUIP ) 4 MG tablet Take 4 mg by mouth at bedtime.   timolol  (TIMOPTIC ) 0.5 % ophthalmic solution Place 1 drop into both eyes 2 (two) times daily.   traZODone  (DESYREL ) 50 MG tablet Take 25 mg by mouth at bedtime.   vitamin B-12 (CYANOCOBALAMIN ) 1000 MCG tablet Take 1,000 mcg by mouth in the morning. (0600)   ziprasidone  (GEODON ) 20 MG capsule Take 20 mg by mouth at bedtime. (2100)   amLODipine  (NORVASC ) 5 MG tablet Take 1 tablet (5 mg total) by mouth daily.   [DISCONTINUED] Brimonidine  Tartrate (LUMIFY ) 0.025 % SOLN Apply 1 drop to eye. Each eye PRN for redness   No  facility-administered encounter medications on file as of 12/24/2023.    Social History: Social History   Tobacco Use   Smoking status: Never    Passive exposure: Never   Smokeless tobacco: Never  Vaping Use   Vaping status: Never Used  Substance Use Topics   Alcohol  use: No    Alcohol /week: 0.0 standard drinks of alcohol    Drug use: No    Family Medical History: Family History  Problem Relation Age of Onset   Stroke Father    Hypertension Father    Breast cancer Other    Colon cancer Neg Hx    Liver disease Neg Hx    Mental illness Neg Hx     Physical Examination: Vitals:   12/24/23 1349  BP: 124/70    General: Patient is well developed, well nourished, calm, collected, and in no apparent distress. Attention to examination is appropriate.  Respiratory: Patient is breathing without any difficulty.   NEUROLOGICAL:     Awake, alert, oriented to person, place, and time.  Speech is clear and fluent. Fund of knowledge is appropriate.   Cranial Nerves: Pupils equal round and reactive to light.  Facial tone is symmetric.    She has minimal right sided cervical tenderness. No posterior cervical tenderness.   No abnormal lesions on exposed skin.   Strength: Side Biceps Triceps Deltoid Interossei Grip Wrist Ext. Wrist Flex.  R 5 5 5 5 5 5 5   L 5 5 5 5 5 5 5    Side Iliopsoas Quads Hamstring PF DF EHL  R 5 5 5 5 5 5   L 5 5 5 5 5 5    Reflexes are 2+ and symmetric at the biceps, brachioradialis, patella and achilles.   Hoffman's is absent.  Clonus is not present.   Bilateral upper and lower extremity sensation is intact to light touch.     Gait is slow but normal.    Medical Decision Making  Imaging: Cervical xrays dated 12/24/23:  Difficult to visualize fracture.   Report for above xrays not yet available.   CT cervical spine dated 12/08/23:  CT CERVICAL SPINE FINDINGS   Alignment: No traumatic malalignment.   Skull base and vertebrae: Fracture through the  right posterior arch of C1 since prior. Margins appear somewhat corticated. Developmentally discontinuous posterior arch.   Soft tissues and spinal canal: No prevertebral fluid or swelling. No visible canal hematoma.   Disc levels:  No acute finding   Upper chest: Clear apical lungs.   Prelim sent in epic  chat.   IMPRESSION: Nondisplaced C1 posterior arch fracture on the right, possibly subacute based on fracture margins, new since 08/05/2022. The posterior arch of C1 is developmentally discontinuous at baseline.   No evidence of intracranial injury.   Motion artifact.     Electronically Signed   By: Ronnette Coke M.D.   On: 12/08/2023 05:09   Cervical xrays dated 12/08/23:  FINDINGS: The posterior arch of C1 is incomplete and appears slightly dysmorphic, unchanged from prior CT examination. The cervical spine is visualized through C6 on lateral examination. C7-T1 is not visualized due to overlying osseous structures. Normal cervical lordosis. No acute fracture or listhesis of the visualized cervical spine. Prevertebral soft tissues are not thickened. Spinal canal is widely patent.   IMPRESSION: 1. No acute fracture or listhesis of the visualized cervical spine. Note that C7-T1 is not visualized. 2. Incomplete posterior arch of C1, unchanged from prior CT examination.     Electronically Signed   By: Worthy Heads M.D.   On: 12/08/2023 02:45   I have personally reviewed the images and agree with the above interpretation.  Assessment and Plan: Ms. Toomey had a fall on 12/03/23 and was found to have  C1 posterior arch fracture.   She has intermittent right sided neck pain. No radiation to her arms. No numbness, tingling or weakness in her arms or legs. Neck pain is improving. She is back to her normal activities.   Difficult to visualize fracture on above xrays, but this fracture is stable. Clinically, she looks good and pain is improving.   Treatment options  discussed with patient and following plan made:   - Collar was for comfort only so she does not need to wear it.  - Will review xrays with Dr. Felipe Horton and call her with his recommendations. She gives us  permission to call facility as well [phone] and fax [phone]  I spent a total of 30 minutes in face-to-face and non-face-to-face activities related to this patient's care today including review of outside records, review of imaging, review of symptoms, physical exam, discussion of differential diagnosis, discussion of treatment options, and documentation.   ADDENDUM 01/01/24:  Cervical xrays dated 12/24/23:  FINDINGS: Discontinuity at the posterior arch of C1 which may relate to combination of known incomplete posterior arch as well as CT demonstrated right posterior arch fracture but alignment appears stable compared with radiographs from 12/08/2023. No displacement of the anterior arch with flexion or extension   IMPRESSION: Discontinuity at the posterior arch of C1 which may relate to combination of known incomplete posterior arch as well as CT demonstrated right posterior arch fracture but alignment appears stable compared with radiographs from 12/08/2023.     Electronically Signed   By: Esmeralda Hedge M.D.   On: 12/30/2023 23:54  Above xrays reviewed with Dr. Felipe Horton. She can d/c cervical collar and return to activity as tolerated. She can follow up prn.   Updated note will be faxed to facility. I called number in chart and left her a message as well.   I called her back later and spoke with patient. She is aware of above. She is not having any neck pain.   Lucetta Russel PA-C Dept. of Neurosurgery

## 2023-12-23 ENCOUNTER — Other Ambulatory Visit: Payer: Self-pay | Admitting: Orthopedic Surgery

## 2023-12-23 DIAGNOSIS — S12031A Nondisplaced posterior arch fracture of first cervical vertebra, initial encounter for closed fracture: Secondary | ICD-10-CM

## 2023-12-24 ENCOUNTER — Ambulatory Visit: Admitting: Orthopedic Surgery

## 2023-12-24 ENCOUNTER — Encounter: Payer: Self-pay | Admitting: Orthopedic Surgery

## 2023-12-24 ENCOUNTER — Ambulatory Visit
Admission: RE | Admit: 2023-12-24 | Discharge: 2023-12-24 | Disposition: A | Discharge status: Home / Self Care | Attending: Orthopedic Surgery | Admitting: Orthopedic Surgery

## 2023-12-24 ENCOUNTER — Ambulatory Visit
Admission: RE | Admit: 2023-12-24 | Discharge: 2023-12-24 | Disposition: A | Discharge status: Home / Self Care | Source: Ambulatory Visit | Attending: Orthopedic Surgery | Admitting: Orthopedic Surgery

## 2023-12-24 VITALS — BP 124/70 | Ht 63.0 in | Wt 146.0 lb

## 2023-12-24 DIAGNOSIS — S12031A Nondisplaced posterior arch fracture of first cervical vertebra, initial encounter for closed fracture: Secondary | ICD-10-CM

## 2023-12-24 DIAGNOSIS — W19XXXA Unspecified fall, initial encounter: Secondary | ICD-10-CM | POA: Diagnosis not present

## 2024-01-05 ENCOUNTER — Emergency Department

## 2024-01-05 ENCOUNTER — Emergency Department
Admission: EM | Admit: 2024-01-05 | Discharge: 2024-01-05 | Disposition: A | Discharge status: Home / Self Care | Attending: Emergency Medicine | Admitting: Emergency Medicine

## 2024-01-05 ENCOUNTER — Other Ambulatory Visit: Payer: Self-pay

## 2024-01-05 DIAGNOSIS — W19XXXA Unspecified fall, initial encounter: Secondary | ICD-10-CM | POA: Insufficient documentation

## 2024-01-05 DIAGNOSIS — M25531 Pain in right wrist: Secondary | ICD-10-CM | POA: Insufficient documentation

## 2024-01-05 NOTE — ED Notes (Signed)
 Three attempts at contact were made to [facility] unsuccessfully. Patient calling her sister to see if she can come transport her back.

## 2024-01-05 NOTE — ED Triage Notes (Signed)
 Arrives from [facility] via [facility]  Fall this morning. No head trauma.  C/O right hand pain.  Vs wnl.

## 2024-01-05 NOTE — ED Triage Notes (Signed)
 Pt comes via EMS from [facility] with c/o fall. Pt was trying to get her pants on and fell. Pt denies any loc or hitting head.  Pt states right wrist pain.

## 2024-01-05 NOTE — ED Provider Notes (Signed)
 [facility] Provider Note    Event Date/Time   First MD Initiated Contact with Patient 01/05/24 [phone]     (approximate)   History   Fall   HPI Joyce [name] is a 78 y.o. female presenting today for fall.  Patient states she was getting dressed earlier this morning when she lost her balance and fell down.  She reports catching herself with her right hand.  Notices pain around her right wrist.  Denies hitting her head or loss of consciousness.  Denies any injury elsewhere or any other pain symptoms.  Has ambulated since the fall with no concerns.     Physical Exam   Triage Vital Signs: ED Triage Vitals  Encounter Vitals Group     BP 01/05/24 0936 (!) 137/56     Girls Systolic BP Percentile --      Girls Diastolic BP Percentile --      Boys Systolic BP Percentile --      Boys Diastolic BP Percentile --      Pulse Rate 01/05/24 0936 60     Resp 01/05/24 0936 17     Temp 01/05/24 0936 98.7 F (37.1 C)     Temp src --      SpO2 01/05/24 0936 100 %     Weight 01/05/24 0935 132 lb (59.9 kg)     Height 01/05/24 0935 5' 3 (1.6 m)     Head Circumference --      Peak Flow --      Pain Score 01/05/24 0935 8     Pain Loc --      Pain Education --      Exclude from Growth Chart --     Most recent vital signs: Vitals:   01/05/24 0936  BP: (!) 137/56  Pulse: 60  Resp: 17  Temp: 98.7 F (37.1 C)  SpO2: 100%   I have reviewed the vital signs. General:  Awake, alert, no acute distress. Head:  Normocephalic, Atraumatic. EENT:  PERRL, EOMI, Oral mucosa pink and moist, Neck is supple.  No C-spine tenderness palpation. Cardiovascular: Regular rate, 2+ distal pulses. Respiratory:  Normal respiratory effort, symmetrical expansion, no distress.   Extremities:  Moving all four extremities through full ROM without pain.  Mild pain around the right wrist without any obvious swelling or deformities.  Elsewise is nontender throughout bilateral upper and lower  extremities. Neuro:  Alert and oriented.  Interacting appropriately.   Skin:  Warm, dry, no rash.   Psych: Appropriate affect.    ED Results / Procedures / Treatments   Labs (all labs ordered are listed, but only abnormal results are displayed) Labs Reviewed - No data to display   EKG    RADIOLOGY Independently interpreted x-ray with no acute pathology   PROCEDURES:  Critical Care performed: No  Procedures   MEDICATIONS ORDERED IN ED: Medications - No data to display   IMPRESSION / MDM / ASSESSMENT AND PLAN / ED COURSE  I reviewed the triage vital signs and the nursing notes.                              Differential diagnosis includes, but is not limited to, distal radius/ulnar fracture, carpal fracture, soft tissue hematoma  Patient's presentation is most consistent with acute complicated illness / injury requiring diagnostic workup.  Patient is a 78 year old female presenting today for ground-level fall with right wrist injury.  No obvious deformity or significant swelling noted on exam.  No injury elsewhere, specifically no head injury.  X-ray of right wrist ordered for further evaluation.  X-ray shows no acute traumatic pathology.  Specifically, I pressed around the area of her scaphoid and snuffbox with no evidence of tenderness given radiology read.  No indication for a splint at this time but recommended repeat x-ray if she has any worsening pain at that site.  Agreeable with plan and safe for discharge.     FINAL CLINICAL IMPRESSION(S) / ED DIAGNOSES   Final diagnoses:  Right wrist pain  Fall, initial encounter     Rx / DC Orders   ED Discharge Orders     None        Note:  This document was prepared using Dragon voice recognition software and may include unintentional dictation errors.   Kandee Orion, MD 01/05/24 1048

## 2024-01-05 NOTE — Discharge Instructions (Signed)
 X-ray showed no evidence of a fracture to your wrist.  Please keep an eye out for any pain higher up around your hand as if you have any worsening symptoms higher up you may need a repeat x-ray.  Otherwise you can use Tylenol  as needed for pain.

## 2024-01-05 NOTE — ED Notes (Signed)
 Patient's DNR was left. DNR was taken to the front desk and the facility was informed.

## 2024-03-29 ENCOUNTER — Other Ambulatory Visit (INDEPENDENT_AMBULATORY_CARE_PROVIDER_SITE_OTHER): Payer: Self-pay | Admitting: Vascular Surgery

## 2024-03-29 DIAGNOSIS — N186 End stage renal disease: Secondary | ICD-10-CM

## 2024-03-31 ENCOUNTER — Encounter (INDEPENDENT_AMBULATORY_CARE_PROVIDER_SITE_OTHER): Payer: Self-pay | Admitting: Vascular Surgery

## 2024-03-31 ENCOUNTER — Ambulatory Visit (INDEPENDENT_AMBULATORY_CARE_PROVIDER_SITE_OTHER): Admitting: Vascular Surgery

## 2024-03-31 ENCOUNTER — Ambulatory Visit (INDEPENDENT_AMBULATORY_CARE_PROVIDER_SITE_OTHER)

## 2024-03-31 VITALS — BP 125/62 | HR 58 | Ht 63.0 in | Wt 137.0 lb

## 2024-03-31 DIAGNOSIS — N186 End stage renal disease: Secondary | ICD-10-CM | POA: Diagnosis not present

## 2024-03-31 DIAGNOSIS — I214 Non-ST elevation (NSTEMI) myocardial infarction: Secondary | ICD-10-CM

## 2024-03-31 DIAGNOSIS — T829XXS Unspecified complication of cardiac and vascular prosthetic device, implant and graft, sequela: Secondary | ICD-10-CM

## 2024-03-31 DIAGNOSIS — I10 Essential (primary) hypertension: Secondary | ICD-10-CM

## 2024-03-31 DIAGNOSIS — K219 Gastro-esophageal reflux disease without esophagitis: Secondary | ICD-10-CM | POA: Diagnosis not present

## 2024-03-31 DIAGNOSIS — Z992 Dependence on renal dialysis: Secondary | ICD-10-CM

## 2024-04-01 ENCOUNTER — Telehealth (INDEPENDENT_AMBULATORY_CARE_PROVIDER_SITE_OTHER): Payer: Self-pay

## 2024-04-01 NOTE — Telephone Encounter (Signed)
 Spoke with Steffan at [facility] and the patient is scheduled with Dr. Jama for a left av fistula banding on 04/15/24 at [facility]. Pre-admit will call to schedule pre-op at the [facility]. Pre-surgical instructions were discussed and will be faxed to attn: Steffan

## 2024-04-03 ENCOUNTER — Encounter (INDEPENDENT_AMBULATORY_CARE_PROVIDER_SITE_OTHER): Payer: Self-pay | Admitting: Vascular Surgery

## 2024-04-03 NOTE — H&P (View-Only) (Signed)
 MRN : [id number]  Joyce [name] is a 78 y.o. (Dec 19, 1945) female who presents with chief complaint of check access.  History of Present Illness:   The patient returns to the office for followup of their dialysis access.   The patient reports the function of the access has been stable. Patient denies difficulty with cannulation. The patient denies increased bleeding time after removing the needles. The patient denies hand pain or other symptoms consistent with steal phenomena.  No significant arm swelling.  However, her cardiologist has identified high output cardiac failure which improved significantly with compression.  The patient denies any complaints from the dialysis center or their nephrologist.  The patient denies redness or swelling at the access site. The patient denies fever or chills at home or while on dialysis.  No recent shortening of the patient's walking distance or new symptoms consistent with claudication.  No history of rest pain symptoms. No new ulcers or wounds of the lower extremities have occurred.  The patient denies amaurosis fugax or recent TIA symptoms. There are no recent neurological changes noted. There is no history of DVT, PE or superficial thrombophlebitis. No recent episodes of angina or shortness of breath documented.   Duplex ultrasound of the AV access shows a patent access.  The previously noted stenosis is not significantly changed compared to last study.  Flow volume today is 3586 cc/min    No outpatient medications have been marked as taking for the 03/31/24 encounter (Office Visit) with Jama, Cordella MATSU, MD.    Past Medical History:  Diagnosis Date   Anal fissure    Bipolar affective disorder (HCC)    CKD (chronic kidney disease)    Dr Marcelino kingsley 4   Colon polyps    Diverticulitis    Diverticulitis    Family history of adverse reaction to anesthesia    mom - PONV   GERD (gastroesophageal reflux disease)     Headache    migraines - none over 10 yrs   Heart murmur    History of hiatal hernia    Hypertension    Pancreatitis    Valproic acid   Vertigo     Past Surgical History:  Procedure Laterality Date   A/V FISTULAGRAM Left 04/14/2017   Procedure: A/V Fistulagram;  Surgeon: Jama Cordella MATSU, MD;  Location: [facility];  Service: Cardiovascular;  Laterality: Left;   A/V FISTULAGRAM Left 06/09/2017   Procedure: A/V FISTULAGRAM;  Surgeon: Jama Cordella MATSU, MD;  Location: [facility];  Service: Cardiovascular;  Laterality: Left;   A/V FISTULAGRAM Left 11/02/2017   Procedure: A/V FISTULAGRAM;  Surgeon: Marea Selinda RAMAN, MD;  Location: [facility];  Service: Cardiovascular;  Laterality: Left;   A/V FISTULAGRAM Left 12/22/2018   Procedure: A/V FISTULAGRAM;  Surgeon: Jama Cordella MATSU, MD;  Location: [facility];  Service: Cardiovascular;  Laterality: Left;   A/V FISTULAGRAM Left 06/12/2020   Procedure: A/V FISTULAGRAM;  Surgeon: Jama Cordella MATSU, MD;  Location: [facility];  Service: Cardiovascular;  Laterality: Left;   A/V FISTULAGRAM Left 01/22/2021   Procedure: A/V FISTULAGRAM;  Surgeon: Jama Cordella MATSU, MD;  Location: [facility];  Service: Cardiovascular;  Laterality: Left;   A/V FISTULAGRAM Left 05/20/2022   Procedure: A/V Fistulagram;  Surgeon: Jama Cordella MATSU, MD;  Location: [facility];  Service: Cardiovascular;  Laterality: Left;   A/V SHUNT INTERVENTION N/A 11/02/2017   Procedure: A/V SHUNT INTERVENTION;  Surgeon: Marea Selinda RAMAN, MD;  Location: [facility];  Service: Cardiovascular;  Laterality: N/A;   ABDOMINAL HYSTERECTOMY  1990 ?   AV FISTULA PLACEMENT  4/30   BREAST EXCISIONAL BIOPSY Left 1994   neg surgical bx   CHOLECYSTECTOMY  2003   COLONOSCOPY  2014   Dr. Dellie   COLONOSCOPY WITH PROPOFOL  N/A 09/24/2015   Procedure: COLONOSCOPY WITH random colon byopies.;  Surgeon: Rogelia Copping, MD;  Location: [facility];  Service: Endoscopy;  Laterality: N/A;   ESOPHAGOGASTRODUODENOSCOPY (EGD) WITH PROPOFOL  N/A 09/24/2015   Procedure: ESOPHAGOGASTRODUODENOSCOPY (EGD) ;  Surgeon: Rogelia Copping, MD;  Location: [facility];  Service: Endoscopy;  Laterality: N/A;   ESOPHAGOGASTRODUODENOSCOPY (EGD) WITH PROPOFOL  N/A 12/11/2020   Procedure: ESOPHAGOGASTRODUODENOSCOPY (EGD) WITH PROPOFOL ;  Surgeon: Copping Rogelia, MD;  Location: [facility];  Service: Endoscopy;  Laterality: N/A;   EYE SURGERY     PERIPHERAL VASCULAR CATHETERIZATION N/A 05/29/2015   Procedure: A/V Shuntogram/Fistulagram;  Surgeon: Cordella KANDICE Shawl, MD;  Location: [facility];  Service: Cardiovascular;  Laterality: N/A;   PERIPHERAL VASCULAR CATHETERIZATION N/A 05/29/2015   Procedure: A/V Shunt Intervention;  Surgeon: Cordella KANDICE Shawl, MD;  Location: [facility];  Service: Cardiovascular;  Laterality: N/A;   POLYPECTOMY  09/24/2015   Procedure: POLYPECTOMY INTESTINAL;  Surgeon: Rogelia Copping, MD;  Location: [facility];  Service: Endoscopy;;  cecal polyp ascending polyp   REVISON OF ARTERIOVENOUS FISTULA Left 08/09/2021   Procedure: REVISON OF ARTERIOVENOUS FISTULA ( BRACHIAL CEPHALIC);  Surgeon: Shawl Cordella KANDICE, MD;  Location: [facility];  Service: Vascular;  Laterality: Left;   UPPER EXTREMITY ANGIOGRAM Left 08/09/2021   Procedure: UPPER EXTREMITY ANGIOGRAM ( POSSIBLE INTERVENTION);  Surgeon: Shawl Cordella KANDICE, MD;  Location: [facility];  Service: Vascular;  Laterality: Left;   UPPER EXTREMITY ANGIOGRAPHY Left 07/02/2021   Procedure: UPPER EXTREMITY ANGIOGRAPHY;  Surgeon: Shawl Cordella KANDICE, MD;  Location: [facility];  Service: Cardiovascular;  Laterality: Left;    Social History Social History   Tobacco Use   Smoking status: Never    Passive exposure: Never   Smokeless tobacco: Never  Vaping Use   Vaping status: Never Used  Substance Use Topics   Alcohol  use: No    Alcohol /week: 0.0 standard  drinks of alcohol    Drug use: No    Family History Family History  Problem Relation Age of Onset   Stroke Father    Hypertension Father    Breast cancer Other    Colon cancer Neg Hx    Liver disease Neg Hx    Mental illness Neg Hx     Allergies  Allergen Reactions   Morphine  And Codeine Shortness Of Breath    Pt reports chest pain and difficulty breathing.   Gabapentin      PER MAR   Indomethacin Hives   Penicillins Other (See Comments)    PER MAR   Pollen Extract Other (See Comments)    Sinus problems and HA     REVIEW OF SYSTEMS (Negative unless checked)  Constitutional: [] Weight loss  [] Fever  [] Chills Cardiac: [] Chest pain   [] Chest pressure   [] Palpitations   [] Shortness of breath when laying flat   [] Shortness of breath with exertion. Vascular:  [] Pain in legs with walking   [] Pain in legs at rest  [] History of DVT   [] Phlebitis   [] Swelling in legs   []   Varicose veins   [] Non-healing ulcers Pulmonary:   [] Uses home oxygen   [] Productive cough   [] Hemoptysis   [] Wheeze  [] COPD   [] Asthma Neurologic:  [] Dizziness   [] Seizures   [] History of stroke   [] History of TIA  [] Aphasia   [] Vissual changes   [] Weakness or numbness in arm   [] Weakness or numbness in leg Musculoskeletal:   [] Joint swelling   [] Joint pain   [] Low back pain Hematologic:  [] Easy bruising  [] Easy bleeding   [] Hypercoagulable state   [] Anemic Gastrointestinal:  [] Diarrhea   [] Vomiting  [] Gastroesophageal reflux/heartburn   [] Difficulty swallowing. Genitourinary:  [x] Chronic kidney disease   [] Difficult urination  [] Frequent urination   [] Blood in urine Skin:  [] Rashes   [] Ulcers  Psychological:  [] History of anxiety   []  History of major depression.  Physical Examination  Vitals:   03/31/24 1455  BP: 125/62  Pulse: (!) 58  Weight: 137 lb (62.1 kg)  Height: 5' 3 (1.6 m)   Body mass index is 24.27 kg/m. Gen: WD/WN, NAD Head: [place]/AT, No temporalis wasting.  Ear/Nose/Throat: Hearing grossly  intact, nares w/o erythema or drainage Eyes: PER, EOMI, sclera nonicteric.  Neck: Supple, no gross masses or lesions.  No JVD.  Pulmonary:  Good air movement, no audible wheezing, no use of accessory muscles.  Cardiac: RRR, precordium non-hyperdynamic. Vascular:   Left arm AV access mild pulsatility good thrill good bruit Vessel Right Left  Radial Palpable Palpable  Brachial Palpable Palpable  Gastrointestinal: soft, non-distended. No guarding/no peritoneal signs.  Musculoskeletal: M/S 5/5 throughout.  No deformity.  Neurologic: CN 2-12 intact. Pain and light touch intact in extremities.  Symmetrical.  Speech is fluent. Motor exam as listed above. Psychiatric: Judgment intact, Mood & affect appropriate for pt's clinical situation. Dermatologic: No rashes or ulcers noted.  No changes consistent with cellulitis.   CBC Lab Results  Component Value Date   WBC 11.0 (H) 08/05/2022   HGB 9.5 (L) 08/05/2022   HCT 28.8 (L) 08/05/2022   MCV 99.7 08/05/2022   PLT 217 08/05/2022    BMET    Component Value Date/Time   NA 135 08/05/2022 0410   NA 143 10/23/2020 0930   NA 139 11/09/2014 1027   K 4.1 08/05/2022 0410   K 4.7 11/09/2014 1027   CL 98 08/05/2022 0410   CL 108 11/09/2014 1027   CO2 29 08/05/2022 0410   CO2 25 11/09/2014 1027   GLUCOSE 115 (H) 08/05/2022 0410   GLUCOSE 100 (H) 11/09/2014 1027   BUN 31 (H) 08/05/2022 0410   BUN 24 10/23/2020 0930   BUN 51 (H) 11/09/2014 1027   CREATININE 5.35 (H) 08/05/2022 0410   CREATININE 2.98 (H) 11/09/2014 1027   CALCIUM  9.1 08/05/2022 0410   CALCIUM  8.9 11/09/2014 1027   GFRNONAA 8 (L) 08/05/2022 0410   GFRNONAA 15 (L) 11/09/2014 1027   GFRAA 11 (L) 05/24/2019 0513   GFRAA 18 (L) 11/09/2014 1027   CrCl cannot be calculated (Patient's most recent lab result is older than the maximum 21 days allowed.).  COAG Lab Results  Component Value Date   INR 1.2 08/30/2021   INR 1.08 10/03/2016   INR 1.0 11/09/2014    Radiology No  results found.   Assessment/Plan 1. Complication of vascular access for dialysis, sequela (Primary) Recommend:  At this time the patient does not have appropriate extremity access for dialysis  Patient should have her fistula banded to minimize the cardiac output and improve her cardiac function.  I did discuss that this may lead to failure of the access but that not treating this condition would have much more severe consequences and therefore we accept this risk.  The risks, benefits and alternative therapies were reviewed in detail with the patient.  All questions were answered.  The patient agrees to proceed with surgery.   The patient will follow up with me in the office after the surgery.  2. ESRD on dialysis [facility]) Recommend:  At this time the patient does not have appropriate extremity access for dialysis  Patient should have her fistula banded to minimize the cardiac output and improve her cardiac function.  I did discuss that this may lead to failure of the access but that not treating this condition would have much more severe consequences and therefore we accept this risk.  The risks, benefits and alternative therapies were reviewed in detail with the patient.  All questions were answered.  The patient agrees to proceed with surgery.   The patient will follow up with me in the office after the surgery.  3. Primary hypertension Continue antihypertensive medications as already ordered, these medications have been reviewed and there are no changes at this time.  4. NSTEMI (non-ST elevated myocardial infarction) (HCC) Continue cardiac and antihypertensive medications as already ordered and reviewed, no changes at this time.  Continue statin as ordered and reviewed, no changes at this time  Nitrates PRN for chest pain  5. Gastro-esophageal reflux disease without esophagitis Continue PPI as already ordered, this medication has been reviewed and there are no changes at this  time.  Avoidence of caffeine and alcohol   Moderate elevation of the head of the bed      Cordella Shawl, MD  04/03/2024 12:23 PM

## 2024-04-03 NOTE — Progress Notes (Signed)
 MRN : [id number]  Joyce [name] is a 78 y.o. (Dec 19, 1945) female who presents with chief complaint of check access.  History of Present Illness:   The patient returns to the office for followup of their dialysis access.   The patient reports the function of the access has been stable. Patient denies difficulty with cannulation. The patient denies increased bleeding time after removing the needles. The patient denies hand pain or other symptoms consistent with steal phenomena.  No significant arm swelling.  However, her cardiologist has identified high output cardiac failure which improved significantly with compression.  The patient denies any complaints from the dialysis center or their nephrologist.  The patient denies redness or swelling at the access site. The patient denies fever or chills at home or while on dialysis.  No recent shortening of the patient's walking distance or new symptoms consistent with claudication.  No history of rest pain symptoms. No new ulcers or wounds of the lower extremities have occurred.  The patient denies amaurosis fugax or recent TIA symptoms. There are no recent neurological changes noted. There is no history of DVT, PE or superficial thrombophlebitis. No recent episodes of angina or shortness of breath documented.   Duplex ultrasound of the AV access shows a patent access.  The previously noted stenosis is not significantly changed compared to last study.  Flow volume today is 3586 cc/min    No outpatient medications have been marked as taking for the 03/31/24 encounter (Office Visit) with Jama, Cordella MATSU, MD.    Past Medical History:  Diagnosis Date   Anal fissure    Bipolar affective disorder (HCC)    CKD (chronic kidney disease)    Dr Marcelino kingsley 4   Colon polyps    Diverticulitis    Diverticulitis    Family history of adverse reaction to anesthesia    mom - PONV   GERD (gastroesophageal reflux disease)     Headache    migraines - none over 10 yrs   Heart murmur    History of hiatal hernia    Hypertension    Pancreatitis    Valproic acid   Vertigo     Past Surgical History:  Procedure Laterality Date   A/V FISTULAGRAM Left 04/14/2017   Procedure: A/V Fistulagram;  Surgeon: Jama Cordella MATSU, MD;  Location: [facility];  Service: Cardiovascular;  Laterality: Left;   A/V FISTULAGRAM Left 06/09/2017   Procedure: A/V FISTULAGRAM;  Surgeon: Jama Cordella MATSU, MD;  Location: [facility];  Service: Cardiovascular;  Laterality: Left;   A/V FISTULAGRAM Left 11/02/2017   Procedure: A/V FISTULAGRAM;  Surgeon: Marea Selinda RAMAN, MD;  Location: [facility];  Service: Cardiovascular;  Laterality: Left;   A/V FISTULAGRAM Left 12/22/2018   Procedure: A/V FISTULAGRAM;  Surgeon: Jama Cordella MATSU, MD;  Location: [facility];  Service: Cardiovascular;  Laterality: Left;   A/V FISTULAGRAM Left 06/12/2020   Procedure: A/V FISTULAGRAM;  Surgeon: Jama Cordella MATSU, MD;  Location: [facility];  Service: Cardiovascular;  Laterality: Left;   A/V FISTULAGRAM Left 01/22/2021   Procedure: A/V FISTULAGRAM;  Surgeon: Jama Cordella MATSU, MD;  Location: [facility];  Service: Cardiovascular;  Laterality: Left;   A/V FISTULAGRAM Left 05/20/2022   Procedure: A/V Fistulagram;  Surgeon: Jama Cordella MATSU, MD;  Location: [facility];  Service: Cardiovascular;  Laterality: Left;   A/V SHUNT INTERVENTION N/A 11/02/2017   Procedure: A/V SHUNT INTERVENTION;  Surgeon: Marea Selinda RAMAN, MD;  Location: [facility];  Service: Cardiovascular;  Laterality: N/A;   ABDOMINAL HYSTERECTOMY  1990 ?   AV FISTULA PLACEMENT  4/30   BREAST EXCISIONAL BIOPSY Left 1994   neg surgical bx   CHOLECYSTECTOMY  2003   COLONOSCOPY  2014   Dr. Dellie   COLONOSCOPY WITH PROPOFOL  N/A 09/24/2015   Procedure: COLONOSCOPY WITH random colon byopies.;  Surgeon: Rogelia Copping, MD;  Location: [facility];  Service: Endoscopy;  Laterality: N/A;   ESOPHAGOGASTRODUODENOSCOPY (EGD) WITH PROPOFOL  N/A 09/24/2015   Procedure: ESOPHAGOGASTRODUODENOSCOPY (EGD) ;  Surgeon: Rogelia Copping, MD;  Location: [facility];  Service: Endoscopy;  Laterality: N/A;   ESOPHAGOGASTRODUODENOSCOPY (EGD) WITH PROPOFOL  N/A 12/11/2020   Procedure: ESOPHAGOGASTRODUODENOSCOPY (EGD) WITH PROPOFOL ;  Surgeon: Copping Rogelia, MD;  Location: [facility];  Service: Endoscopy;  Laterality: N/A;   EYE SURGERY     PERIPHERAL VASCULAR CATHETERIZATION N/A 05/29/2015   Procedure: A/V Shuntogram/Fistulagram;  Surgeon: Cordella KANDICE Shawl, MD;  Location: [facility];  Service: Cardiovascular;  Laterality: N/A;   PERIPHERAL VASCULAR CATHETERIZATION N/A 05/29/2015   Procedure: A/V Shunt Intervention;  Surgeon: Cordella KANDICE Shawl, MD;  Location: [facility];  Service: Cardiovascular;  Laterality: N/A;   POLYPECTOMY  09/24/2015   Procedure: POLYPECTOMY INTESTINAL;  Surgeon: Rogelia Copping, MD;  Location: [facility];  Service: Endoscopy;;  cecal polyp ascending polyp   REVISON OF ARTERIOVENOUS FISTULA Left 08/09/2021   Procedure: REVISON OF ARTERIOVENOUS FISTULA ( BRACHIAL CEPHALIC);  Surgeon: Shawl Cordella KANDICE, MD;  Location: [facility];  Service: Vascular;  Laterality: Left;   UPPER EXTREMITY ANGIOGRAM Left 08/09/2021   Procedure: UPPER EXTREMITY ANGIOGRAM ( POSSIBLE INTERVENTION);  Surgeon: Shawl Cordella KANDICE, MD;  Location: [facility];  Service: Vascular;  Laterality: Left;   UPPER EXTREMITY ANGIOGRAPHY Left 07/02/2021   Procedure: UPPER EXTREMITY ANGIOGRAPHY;  Surgeon: Shawl Cordella KANDICE, MD;  Location: [facility];  Service: Cardiovascular;  Laterality: Left;    Social History Social History   Tobacco Use   Smoking status: Never    Passive exposure: Never   Smokeless tobacco: Never  Vaping Use   Vaping status: Never Used  Substance Use Topics   Alcohol  use: No    Alcohol /week: 0.0 standard  drinks of alcohol    Drug use: No    Family History Family History  Problem Relation Age of Onset   Stroke Father    Hypertension Father    Breast cancer Other    Colon cancer Neg Hx    Liver disease Neg Hx    Mental illness Neg Hx     Allergies  Allergen Reactions   Morphine  And Codeine Shortness Of Breath    Pt reports chest pain and difficulty breathing.   Gabapentin      PER MAR   Indomethacin Hives   Penicillins Other (See Comments)    PER MAR   Pollen Extract Other (See Comments)    Sinus problems and HA     REVIEW OF SYSTEMS (Negative unless checked)  Constitutional: [] Weight loss  [] Fever  [] Chills Cardiac: [] Chest pain   [] Chest pressure   [] Palpitations   [] Shortness of breath when laying flat   [] Shortness of breath with exertion. Vascular:  [] Pain in legs with walking   [] Pain in legs at rest  [] History of DVT   [] Phlebitis   [] Swelling in legs   []   Varicose veins   [] Non-healing ulcers Pulmonary:   [] Uses home oxygen   [] Productive cough   [] Hemoptysis   [] Wheeze  [] COPD   [] Asthma Neurologic:  [] Dizziness   [] Seizures   [] History of stroke   [] History of TIA  [] Aphasia   [] Vissual changes   [] Weakness or numbness in arm   [] Weakness or numbness in leg Musculoskeletal:   [] Joint swelling   [] Joint pain   [] Low back pain Hematologic:  [] Easy bruising  [] Easy bleeding   [] Hypercoagulable state   [] Anemic Gastrointestinal:  [] Diarrhea   [] Vomiting  [] Gastroesophageal reflux/heartburn   [] Difficulty swallowing. Genitourinary:  [x] Chronic kidney disease   [] Difficult urination  [] Frequent urination   [] Blood in urine Skin:  [] Rashes   [] Ulcers  Psychological:  [] History of anxiety   []  History of major depression.  Physical Examination  Vitals:   03/31/24 1455  BP: 125/62  Pulse: (!) 58  Weight: 137 lb (62.1 kg)  Height: 5' 3 (1.6 m)   Body mass index is 24.27 kg/m. Gen: WD/WN, NAD Head: [place]/AT, No temporalis wasting.  Ear/Nose/Throat: Hearing grossly  intact, nares w/o erythema or drainage Eyes: PER, EOMI, sclera nonicteric.  Neck: Supple, no gross masses or lesions.  No JVD.  Pulmonary:  Good air movement, no audible wheezing, no use of accessory muscles.  Cardiac: RRR, precordium non-hyperdynamic. Vascular:   Left arm AV access mild pulsatility good thrill good bruit Vessel Right Left  Radial Palpable Palpable  Brachial Palpable Palpable  Gastrointestinal: soft, non-distended. No guarding/no peritoneal signs.  Musculoskeletal: M/S 5/5 throughout.  No deformity.  Neurologic: CN 2-12 intact. Pain and light touch intact in extremities.  Symmetrical.  Speech is fluent. Motor exam as listed above. Psychiatric: Judgment intact, Mood & affect appropriate for pt's clinical situation. Dermatologic: No rashes or ulcers noted.  No changes consistent with cellulitis.   CBC Lab Results  Component Value Date   WBC 11.0 (H) 08/05/2022   HGB 9.5 (L) 08/05/2022   HCT 28.8 (L) 08/05/2022   MCV 99.7 08/05/2022   PLT 217 08/05/2022    BMET    Component Value Date/Time   NA 135 08/05/2022 0410   NA 143 10/23/2020 0930   NA 139 11/09/2014 1027   K 4.1 08/05/2022 0410   K 4.7 11/09/2014 1027   CL 98 08/05/2022 0410   CL 108 11/09/2014 1027   CO2 29 08/05/2022 0410   CO2 25 11/09/2014 1027   GLUCOSE 115 (H) 08/05/2022 0410   GLUCOSE 100 (H) 11/09/2014 1027   BUN 31 (H) 08/05/2022 0410   BUN 24 10/23/2020 0930   BUN 51 (H) 11/09/2014 1027   CREATININE 5.35 (H) 08/05/2022 0410   CREATININE 2.98 (H) 11/09/2014 1027   CALCIUM  9.1 08/05/2022 0410   CALCIUM  8.9 11/09/2014 1027   GFRNONAA 8 (L) 08/05/2022 0410   GFRNONAA 15 (L) 11/09/2014 1027   GFRAA 11 (L) 05/24/2019 0513   GFRAA 18 (L) 11/09/2014 1027   CrCl cannot be calculated (Patient's most recent lab result is older than the maximum 21 days allowed.).  COAG Lab Results  Component Value Date   INR 1.2 08/30/2021   INR 1.08 10/03/2016   INR 1.0 11/09/2014    Radiology No  results found.   Assessment/Plan 1. Complication of vascular access for dialysis, sequela (Primary) Recommend:  At this time the patient does not have appropriate extremity access for dialysis  Patient should have her fistula banded to minimize the cardiac output and improve her cardiac function.  I did discuss that this may lead to failure of the access but that not treating this condition would have much more severe consequences and therefore we accept this risk.  The risks, benefits and alternative therapies were reviewed in detail with the patient.  All questions were answered.  The patient agrees to proceed with surgery.   The patient will follow up with me in the office after the surgery.  2. ESRD on dialysis [facility]) Recommend:  At this time the patient does not have appropriate extremity access for dialysis  Patient should have her fistula banded to minimize the cardiac output and improve her cardiac function.  I did discuss that this may lead to failure of the access but that not treating this condition would have much more severe consequences and therefore we accept this risk.  The risks, benefits and alternative therapies were reviewed in detail with the patient.  All questions were answered.  The patient agrees to proceed with surgery.   The patient will follow up with me in the office after the surgery.  3. Primary hypertension Continue antihypertensive medications as already ordered, these medications have been reviewed and there are no changes at this time.  4. NSTEMI (non-ST elevated myocardial infarction) (HCC) Continue cardiac and antihypertensive medications as already ordered and reviewed, no changes at this time.  Continue statin as ordered and reviewed, no changes at this time  Nitrates PRN for chest pain  5. Gastro-esophageal reflux disease without esophagitis Continue PPI as already ordered, this medication has been reviewed and there are no changes at this  time.  Avoidence of caffeine and alcohol   Moderate elevation of the head of the bed      Cordella Shawl, MD  04/03/2024 12:23 PM

## 2024-04-12 ENCOUNTER — Other Ambulatory Visit (INDEPENDENT_AMBULATORY_CARE_PROVIDER_SITE_OTHER): Payer: Self-pay | Admitting: Nurse Practitioner

## 2024-04-12 ENCOUNTER — Encounter
Admission: RE | Admit: 2024-04-12 | Discharge: 2024-04-12 | Disposition: A | Discharge status: Home / Self Care | Source: Ambulatory Visit | Attending: Anesthesiology | Admitting: Anesthesiology

## 2024-04-12 ENCOUNTER — Inpatient Hospital Stay: Admission: RE | Admit: 2024-04-12 | Source: Ambulatory Visit

## 2024-04-12 ENCOUNTER — Encounter: Payer: Self-pay | Admitting: Vascular Surgery

## 2024-04-12 DIAGNOSIS — N186 End stage renal disease: Secondary | ICD-10-CM | POA: Diagnosis not present

## 2024-04-12 DIAGNOSIS — D631 Anemia in chronic kidney disease: Secondary | ICD-10-CM | POA: Insufficient documentation

## 2024-04-12 DIAGNOSIS — Z01812 Encounter for preprocedural laboratory examination: Secondary | ICD-10-CM | POA: Diagnosis present

## 2024-04-12 DIAGNOSIS — N189 Chronic kidney disease, unspecified: Secondary | ICD-10-CM

## 2024-04-12 DIAGNOSIS — Z992 Dependence on renal dialysis: Secondary | ICD-10-CM | POA: Insufficient documentation

## 2024-04-12 HISTORY — DX: Migraine, unspecified, not intractable, without status migrainosus: G43.909

## 2024-04-12 HISTORY — DX: End stage renal disease: N18.6

## 2024-04-12 LAB — BASIC METABOLIC PANEL WITH GFR
Anion gap: 9 (ref 5–15)
BUN: 24 mg/dL — ABNORMAL HIGH (ref 8–23)
CO2: 28 mmol/L (ref 22–32)
Calcium: 9.2 mg/dL (ref 8.9–10.3)
Chloride: 98 mmol/L (ref 98–111)
Creatinine, Ser: 6.41 mg/dL — ABNORMAL HIGH (ref 0.44–1.00)
GFR, Estimated: 6 mL/min — ABNORMAL LOW (ref >60)
Glucose, Bld: 109 mg/dL — ABNORMAL HIGH (ref 70–99)
Potassium: 4.4 mmol/L (ref 3.5–5.1)
Sodium: 135 mmol/L (ref 135–145)

## 2024-04-12 LAB — CBC
HCT: 32 % — ABNORMAL LOW (ref 36.0–46.0)
Hemoglobin: 10.4 g/dL — ABNORMAL LOW (ref 12.0–15.0)
MCH: 33.4 pg (ref 26.0–34.0)
MCHC: 32.5 g/dL (ref 30.0–36.0)
MCV: 102.9 fL — ABNORMAL HIGH (ref 80.0–100.0)
Platelets: 184 K/uL (ref 150–400)
RBC: 3.11 MIL/uL — ABNORMAL LOW (ref 3.87–5.11)
RDW: 14 % (ref 11.5–15.5)
WBC: 5.7 K/uL (ref 4.0–10.5)
nRBC: 0 % (ref 0.0–0.2)

## 2024-04-12 LAB — TYPE AND SCREEN
ABO/RH(D): O POS
Antibody Screen: NEGATIVE

## 2024-04-12 NOTE — Patient Instructions (Addendum)
 Your procedure is scheduled on:04/15/2024 Friday Report to the Registration Desk on the 1st floor of the [facility]. To find out your arrival time, please call [phone] between 1PM - 3PM on: Thursday, 04/14/2024  If your arrival time is 6:00 am, do not arrive before that time as the [facility] entrance doors do not open until 6:00 am.  REMEMBER: Instructions that are not followed completely may result in serious medical risk, up to and including death; or upon the discretion of your surgeon and anesthesiologist your surgery may need to be rescheduled.  Do not eat food or drink anything after midnight the night before surgery.  No gum chewing or hard candies.     One week prior to surgery: Stop Anti-inflammatories (NSAIDS) such as Advil, Aleve, Ibuprofen, Motrin, Naproxen, Naprosyn and Aspirin  based products such as Excedrin, Goody's Powder, BC Powder. Stop ANY OVER THE COUNTER supplements until after surgery like Vit B and Vit D  You may however, continue to take Tylenol  if needed for pain up until the day of surgery.   Continue taking all of your other prescription medications up until the day of surgery.  ON THE DAY OF SURGERY ONLY TAKE THESE MEDICATIONS WITH SIPS OF WATER:  amLODipine  (NORVASC ) carbamazepine  (TEGRETOL ) famotidine  (PEPCID )     No Alcohol  for 24 hours before or after surgery.  No Smoking including e-cigarettes for 24 hours before surgery.  No chewable tobacco products for at least 6 hours before surgery.  No nicotine patches on the day of surgery.  Do not use any recreational drugs for at least a week (preferably 2 weeks) before your surgery.  Please be advised that the combination of cocaine and anesthesia may have negative outcomes, up to and including death. If you test positive for cocaine, your surgery will be cancelled.  On the morning of surgery brush your teeth with toothpaste and water, you may rinse your mouth with mouthwash if you  wish. Do not swallow any toothpaste or mouthwash.  Use CHG Soap or wipes as directed on instruction sheet.-provided for you  Do not wear jewelry, make-up, hairpins, clips or nail polish.  Do not wear lotions, powders, or perfumes.   Do not shave body hair from the neck down 48 hours before surgery.  Contact lenses, hearing aids and dentures may not be worn into surgery.  Do not bring valuables to the hospital. [facility] is not responsible for any missing/lost belongings or valuables.    Notify your doctor if there is any change in your medical condition (cold, fever, infection).  Wear comfortable clothing (specific to your surgery type) to the hospital.  After surgery, you can help prevent lung complications by doing breathing exercises.  Take deep breaths and cough every 1-2 hours. Your doctor may order a device called an Incentive Spirometer to help you take deep breaths.  IIf you are being discharged the day of surgery, you will not be allowed to drive home. You will need a responsible individual to drive you home and stay with you for 24 hours after surgery.   If you are taking public transportation, you will need to have a responsible individual with you.  Please call the Pre-admissions Testing Dept. at [phone] if you have any questions about these instructions.  Surgery Visitation Policy:  Patients having surgery or a procedure may have two visitors.  Children under the age of 35 must have an adult with them who is not the patient.  Inpatient Visitation:  Visiting hours are 7 a.m. to 8 p.m. Up to four visitors are allowed at one time in a patient room. The visitors may rotate out with other people during the day.  One visitor age 48 or older may stay with the patient overnight and must be in the room by 8 p.m.   Merchandiser, retail to address health-related social needs:  [email or web address]                                                                                                              Preparing the Skin Before Surgery  To help prevent the risk of infection at your surgical site, we are now providing you with rinse-free Sage 2% Chlorhexidine  Gluconate (CHG) disposable wipes.  Chlorhexidine  Gluconate (CHG) Soap  o An antiseptic cleaner that kills germs and bonds with the skin to continue killing germs even after washing  o Used for showering the night before surgery and morning of surgery  The night before surgery: Shower or bathe with warm water. Do not apply perfume, lotions, powders. Wait one hour after shower. Skin should be dry and cool. Open Sage wipe package - use 6 disposable cloths. Wipe body using one cloth for the right arm, one cloth for the left arm, one cloth for the right leg, one cloth for the left leg, one cloth for the chest/abdomen area, and one cloth for the back. Do not use on open wounds or sores. Do not use on face or genitals (private parts). If you are breast feeding, do not use on breasts. 5. Do not rinse, allow to dry. 6. Skin may feel tacky for several minutes. 7. Dress in clean clothes. 8. Place clean sheets on your bed and do not sleep with pets.  REPEAT ABOVE ON THE MORNING OF SURGERY BEFORE ARRIVING TO THE HOSPITAL.

## 2024-04-12 NOTE — Pre-Procedure Instructions (Signed)
 Patient came in for Pre-admit testing. Alert and oriented x 4. Pre-anesthesia instructions given to patient. Patient verbalized understanding. Attempted to call staff nurse at [facility] x 3 to discuss instructions  but unsuccessful. Not able to talk to the responsible nurse. Therefore, pre-anesthesia instructions will be faxed as well with the given fax number [phone]. Talked to different nurse Johnny) and instructed them to call us  if they have questions with the instructions.

## 2024-04-12 NOTE — Pre-Procedure Instructions (Addendum)
 Faxed the pre-anesthesia  instructions to the fax number given [phone] . Attempted to call staff RN Claude) again at [facility] facility for the 4th times and still unable to get in touch with the nurse. I talked to another person (female- receptionist) and requested to relay a message to [facility] about information being faxed over and to review and call us  if any questions. Additionally, I have given instructions to the patient and a copy to be given to responsible nurse at [facility]. Patient verbalized understanding.

## 2024-04-14 MED ORDER — ORAL CARE MOUTH RINSE: 15.0000 mL | Freq: Once | OROMUCOSAL | Status: AC

## 2024-04-14 MED ORDER — CHLORHEXIDINE GLUCONATE CLOTH 2 % EX PADS: 6.0000 | MEDICATED_PAD | Freq: Once | CUTANEOUS | Status: DC

## 2024-04-14 MED ORDER — SODIUM CHLORIDE 0.9 % IV SOLN: INTRAVENOUS | Status: DC

## 2024-04-14 MED ORDER — CHLORHEXIDINE GLUCONATE 0.12 % MT SOLN
15.0000 mL | Freq: Once | OROMUCOSAL | Status: AC
  Administered 2024-04-15: 15 mL via OROMUCOSAL

## 2024-04-14 MED ORDER — VANCOMYCIN HCL IN DEXTROSE 1-5 GM/200ML-% IV SOLN
1000.0000 mg | INTRAVENOUS | Status: DC
Start: 2024-04-15 — End: 2024-04-16
  Filled 2024-04-14: qty 200

## 2024-04-15 ENCOUNTER — Other Ambulatory Visit: Payer: Self-pay

## 2024-04-15 ENCOUNTER — Ambulatory Visit
Admission: RE | Admit: 2024-04-15 | Discharge: 2024-04-15 | Disposition: A | Discharge status: Home / Self Care | Attending: Vascular Surgery | Admitting: Vascular Surgery

## 2024-04-15 ENCOUNTER — Ambulatory Visit: Admitting: Anesthesiology

## 2024-04-15 ENCOUNTER — Encounter
Admission: RE | Disposition: A | Discharge status: Home / Self Care | Payer: Self-pay | Source: Home / Self Care | Attending: Vascular Surgery

## 2024-04-15 ENCOUNTER — Encounter: Payer: Self-pay | Admitting: Vascular Surgery

## 2024-04-15 ENCOUNTER — Ambulatory Visit: Payer: Self-pay | Admitting: Urgent Care

## 2024-04-15 DIAGNOSIS — N189 Chronic kidney disease, unspecified: Secondary | ICD-10-CM

## 2024-04-15 DIAGNOSIS — I509 Heart failure, unspecified: Secondary | ICD-10-CM | POA: Diagnosis not present

## 2024-04-15 DIAGNOSIS — R519 Headache, unspecified: Secondary | ICD-10-CM | POA: Diagnosis not present

## 2024-04-15 DIAGNOSIS — I5083 High output heart failure: Secondary | ICD-10-CM | POA: Insufficient documentation

## 2024-04-15 DIAGNOSIS — X58XXXA Exposure to other specified factors, initial encounter: Secondary | ICD-10-CM | POA: Diagnosis not present

## 2024-04-15 DIAGNOSIS — K219 Gastro-esophageal reflux disease without esophagitis: Secondary | ICD-10-CM | POA: Insufficient documentation

## 2024-04-15 DIAGNOSIS — R42 Dizziness and giddiness: Secondary | ICD-10-CM | POA: Diagnosis not present

## 2024-04-15 DIAGNOSIS — F319 Bipolar disorder, unspecified: Secondary | ICD-10-CM | POA: Insufficient documentation

## 2024-04-15 DIAGNOSIS — I132 Hypertensive heart and chronic kidney disease with heart failure and with stage 5 chronic kidney disease, or end stage renal disease: Secondary | ICD-10-CM | POA: Diagnosis not present

## 2024-04-15 DIAGNOSIS — Z79899 Other long term (current) drug therapy: Secondary | ICD-10-CM | POA: Insufficient documentation

## 2024-04-15 DIAGNOSIS — Z992 Dependence on renal dialysis: Secondary | ICD-10-CM | POA: Insufficient documentation

## 2024-04-15 DIAGNOSIS — I252 Old myocardial infarction: Secondary | ICD-10-CM | POA: Insufficient documentation

## 2024-04-15 DIAGNOSIS — Z01812 Encounter for preprocedural laboratory examination: Secondary | ICD-10-CM

## 2024-04-15 DIAGNOSIS — D631 Anemia in chronic kidney disease: Secondary | ICD-10-CM | POA: Insufficient documentation

## 2024-04-15 DIAGNOSIS — K449 Diaphragmatic hernia without obstruction or gangrene: Secondary | ICD-10-CM | POA: Insufficient documentation

## 2024-04-15 DIAGNOSIS — T82898A Other specified complication of vascular prosthetic devices, implants and grafts, initial encounter: Secondary | ICD-10-CM | POA: Insufficient documentation

## 2024-04-15 DIAGNOSIS — N186 End stage renal disease: Secondary | ICD-10-CM | POA: Diagnosis present

## 2024-04-15 HISTORY — PX: REVISON OF ARTERIOVENOUS FISTULA: SHX6074

## 2024-04-15 LAB — POCT I-STAT, CHEM 8
BUN: 28 mg/dL — ABNORMAL HIGH (ref 8–23)
Calcium, Ion: 1.14 mmol/L — ABNORMAL LOW (ref 1.15–1.40)
Chloride: 101 mmol/L (ref 98–111)
Creatinine, Ser: 8 mg/dL — ABNORMAL HIGH (ref 0.44–1.00)
Glucose, Bld: 90 mg/dL (ref 70–99)
HCT: 31 % — ABNORMAL LOW (ref 36.0–46.0)
Hemoglobin: 10.5 g/dL — ABNORMAL LOW (ref 12.0–15.0)
Potassium: 4.4 mmol/L (ref 3.5–5.1)
Sodium: 133 mmol/L — ABNORMAL LOW (ref 135–145)
TCO2: 23 mmol/L (ref 22–32)

## 2024-04-15 LAB — ABO/RH: ABO/RH(D): O POS

## 2024-04-15 SURGERY — REVISON OF ARTERIOVENOUS FISTULA
Anesthesia: General | Laterality: Left

## 2024-04-15 MED ORDER — PROPOFOL 10 MG/ML IV BOLUS
INTRAVENOUS | Status: DC | PRN
  Administered 2024-04-15: 100 mg via INTRAVENOUS
  Administered 2024-04-15: 20 mg via INTRAVENOUS

## 2024-04-15 MED ORDER — LIDOCAINE HCL (CARDIAC) PF 100 MG/5ML IV SOSY
PREFILLED_SYRINGE | INTRAVENOUS | Status: DC | PRN
  Administered 2024-04-15: 60 mg via INTRAVENOUS

## 2024-04-15 MED ORDER — DROPERIDOL 2.5 MG/ML IJ SOLN
0.6250 mg | Freq: Once | INTRAMUSCULAR | Status: AC
  Administered 2024-04-15: 0.625 mg via INTRAVENOUS

## 2024-04-15 MED ORDER — HEPARIN SODIUM (PORCINE) 5000 UNIT/ML IJ SOLN
INTRAMUSCULAR | Status: AC
  Filled 2024-04-15: qty 1

## 2024-04-15 MED ORDER — BUPIVACAINE HCL (PF) 0.5 % IJ SOLN
INTRAMUSCULAR | Status: DC | PRN
  Administered 2024-04-15: 7 mL
  Administered 2024-04-15: 5 mL

## 2024-04-15 MED ORDER — VASHE WOUND IRRIGATION OPTIME
TOPICAL | Status: DC | PRN
  Administered 2024-04-15: 4 [oz_av] via TOPICAL

## 2024-04-15 MED ORDER — FENTANYL CITRATE (PF) 100 MCG/2ML IJ SOLN
INTRAMUSCULAR | Status: DC | PRN
  Administered 2024-04-15: 25 ug via INTRAVENOUS
  Administered 2024-04-15: 50 ug via INTRAVENOUS

## 2024-04-15 MED ORDER — LACTATED RINGERS IV SOLN: INTRAVENOUS | Status: DC

## 2024-04-15 MED ORDER — HEPARIN SODIUM (PORCINE) 1000 UNIT/ML IJ SOLN
INTRAMUSCULAR | Status: DC | PRN
  Administered 2024-04-15: 4000 [IU] via INTRAVENOUS

## 2024-04-15 MED ORDER — OXYCODONE HCL 5 MG/5ML PO SOLN: 5.0000 mg | Freq: Once | ORAL | Status: DC | PRN

## 2024-04-15 MED ORDER — MIDAZOLAM HCL 2 MG/2ML IJ SOLN
INTRAMUSCULAR | Status: AC
  Filled 2024-04-15: qty 2

## 2024-04-15 MED ORDER — OXYCODONE HCL 5 MG PO TABS: 5.0000 mg | ORAL_TABLET | Freq: Once | ORAL | Status: DC | PRN

## 2024-04-15 MED ORDER — EPHEDRINE SULFATE-NACL 50-0.9 MG/10ML-% IV SOSY
PREFILLED_SYRINGE | INTRAVENOUS | Status: DC | PRN
  Administered 2024-04-15 (×3): 5 mg via INTRAVENOUS

## 2024-04-15 MED ORDER — FENTANYL CITRATE (PF) 100 MCG/2ML IJ SOLN
INTRAMUSCULAR | Status: AC
  Filled 2024-04-15: qty 2

## 2024-04-15 MED ORDER — ONDANSETRON HCL 4 MG/2ML IJ SOLN
INTRAMUSCULAR | Status: DC | PRN
  Administered 2024-04-15: 4 mg via INTRAVENOUS

## 2024-04-15 MED ORDER — MIDAZOLAM HCL 2 MG/2ML IJ SOLN
INTRAMUSCULAR | Status: DC | PRN
  Administered 2024-04-15: 2 mg via INTRAVENOUS

## 2024-04-15 MED ORDER — DROPERIDOL 2.5 MG/ML IJ SOLN
INTRAMUSCULAR | Status: AC
  Filled 2024-04-15: qty 2

## 2024-04-15 MED ORDER — DEXAMETHASONE SODIUM PHOSPHATE 10 MG/ML IJ SOLN
INTRAMUSCULAR | Status: DC | PRN
  Administered 2024-04-15: 4 mg via INTRAVENOUS

## 2024-04-15 MED ORDER — VANCOMYCIN HCL IN DEXTROSE 1-5 GM/200ML-% IV SOLN
1000.0000 mg | Freq: Once | INTRAVENOUS | Status: AC
  Administered 2024-04-15: 1000 mg via INTRAVENOUS
  Filled 2024-04-15: qty 200

## 2024-04-15 MED ORDER — ONDANSETRON HCL 4 MG/2ML IJ SOLN
4.0000 mg | Freq: Once | INTRAMUSCULAR | Status: AC | PRN
  Administered 2024-04-15: 4 mg via INTRAVENOUS

## 2024-04-15 MED ORDER — BUPIVACAINE HCL (PF) 0.5 % IJ SOLN
INTRAMUSCULAR | Status: AC
  Filled 2024-04-15: qty 30

## 2024-04-15 MED ORDER — HYDROCODONE-ACETAMINOPHEN 5-325 MG PO TABS: 1.0000 | ORAL_TABLET | Freq: Four times a day (QID) | ORAL | 0 refills | Status: AC | PRN

## 2024-04-15 MED ORDER — BUPIVACAINE LIPOSOME 1.3 % IJ SUSP
INTRAMUSCULAR | Status: DC | PRN
  Administered 2024-04-15: 7 mL
  Administered 2024-04-15: 5 mL

## 2024-04-15 MED ORDER — PROPOFOL 1000 MG/100ML IV EMUL
INTRAVENOUS | Status: AC
Start: 2024-04-15 — End: 2024-04-15
  Filled 2024-04-15: qty 100

## 2024-04-15 MED ORDER — FENTANYL CITRATE (PF) 100 MCG/2ML IJ SOLN: 25.0000 ug | INTRAMUSCULAR | Status: DC | PRN

## 2024-04-15 MED ORDER — ACETAMINOPHEN 10 MG/ML IV SOLN: 1000.0000 mg | Freq: Once | INTRAVENOUS | Status: DC | PRN

## 2024-04-15 MED ORDER — KETAMINE HCL 50 MG/5ML IJ SOSY
PREFILLED_SYRINGE | INTRAMUSCULAR | Status: AC
Start: 2024-04-15 — End: 2024-04-15
  Filled 2024-04-15: qty 5

## 2024-04-15 MED ORDER — ONDANSETRON HCL 4 MG/2ML IJ SOLN
INTRAMUSCULAR | Status: AC
  Filled 2024-04-15: qty 2

## 2024-04-15 MED ORDER — HEPARIN SODIUM (PORCINE) 1000 UNIT/ML IJ SOLN
INTRAMUSCULAR | Status: AC
  Filled 2024-04-15: qty 10

## 2024-04-15 MED ORDER — KETAMINE HCL 50 MG/5ML IJ SOSY
PREFILLED_SYRINGE | INTRAMUSCULAR | Status: DC | PRN
  Administered 2024-04-15: 20 mg via INTRAVENOUS

## 2024-04-15 MED ORDER — CHLORHEXIDINE GLUCONATE 0.12 % MT SOLN
OROMUCOSAL | Status: AC
  Filled 2024-04-15: qty 15

## 2024-04-15 SURGICAL SUPPLY — 39 items
BAG DECANTER FOR FLEXI CONT (MISCELLANEOUS) ×1 IMPLANT
BLADE SURG SZ11 CARB STEEL (BLADE) ×1 IMPLANT
CHLORAPREP W/TINT 26 (MISCELLANEOUS) ×1 IMPLANT
CLAMP SUTURE YELLOW 5 PAIRS (MISCELLANEOUS) IMPLANT
CLEANSER WND VASHE 4 (WOUND CARE) ×1 IMPLANT
CLIP TI MEDIUM 6 (CLIP) IMPLANT
CLIP TI WIDE RED SMALL 6 (CLIP) IMPLANT
DERMABOND ADVANCED .7 DNX12 (GAUZE/BANDAGES/DRESSINGS) ×1 IMPLANT
DRESSING SURGICEL FIBRLLR 1X2 (HEMOSTASIS) ×1 IMPLANT
ELECT CAUTERY BLADE 6.4 (BLADE) ×1 IMPLANT
ELECTRODE REM PT RTRN 9FT ADLT (ELECTROSURGICAL) ×1 IMPLANT
GLOVE BIO SURGEON STRL SZ7 (GLOVE) ×1 IMPLANT
GLOVE SURG SYN 8.0 PF PI (GLOVE) ×1 IMPLANT
GOWN STRL REUS W/ TWL LRG LVL3 (GOWN DISPOSABLE) ×2 IMPLANT
GOWN STRL REUS W/ TWL XL LVL3 (GOWN DISPOSABLE) ×1 IMPLANT
IV NS 500ML BAXH (IV SOLUTION) ×1 IMPLANT
KIT TURNOVER KIT A (KITS) ×1 IMPLANT
LABEL OR SOLS (LABEL) ×1 IMPLANT
LOOP VESSEL MAXI 1X406 RED (MISCELLANEOUS) IMPLANT
LOOP VESSEL MINI 0.8X406 BLUE (MISCELLANEOUS) IMPLANT
MANIFOLD NEPTUNE II (INSTRUMENTS) ×1 IMPLANT
NDL FILTER BLUNT 18X1 1/2 (NEEDLE) ×1 IMPLANT
NEEDLE FILTER BLUNT 18X1 1/2 (NEEDLE) ×1 IMPLANT
PACK EXTREMITY ARMC (MISCELLANEOUS) ×1 IMPLANT
PAD PREP OB/GYN DISP 24X41 (PERSONAL CARE ITEMS) ×1 IMPLANT
PENCIL SMOKE EVACUATOR (MISCELLANEOUS) ×1 IMPLANT
STOCKINETTE 48X4 2 PLY STRL (GAUZE/BANDAGES/DRESSINGS) ×1 IMPLANT
STOCKINETTE STRL 4IN 9604848 (GAUZE/BANDAGES/DRESSINGS) ×1 IMPLANT
SUT ETHIBOND 0 36 GRN (SUTURE) IMPLANT
SUT MNCRL+ 5-0 UNDYED PC-3 (SUTURE) ×1 IMPLANT
SUT PROLENE 6 0 BV (SUTURE) IMPLANT
SUT SILK 2-0 18XBRD TIE 12 (SUTURE) IMPLANT
SUT SILK 3-0 18XBRD TIE 12 (SUTURE) IMPLANT
SUT SILK 4-0 18XBRD TIE 12 (SUTURE) IMPLANT
SUT VIC AB 3-0 SH 27X BRD (SUTURE) ×1 IMPLANT
SYR 20ML LL LF (SYRINGE) ×1 IMPLANT
SYR 3ML LL SCALE MARK (SYRINGE) ×1 IMPLANT
TRAP FLUID SMOKE EVACUATOR (MISCELLANEOUS) ×1 IMPLANT
WATER STERILE IRR 500ML POUR (IV SOLUTION) ×1 IMPLANT

## 2024-04-15 NOTE — Anesthesia Postprocedure Evaluation (Signed)
 Anesthesia Post Note  Patient: Joyce [name]  Procedure(s) Performed: REVISON OF ARTERIOVENOUS FISTULA (Left)  Patient location during evaluation: PACU Anesthesia Type: General Level of consciousness: awake and alert, oriented and patient cooperative Pain management: pain level controlled Vital Signs Assessment: post-procedure vital signs reviewed and stable Respiratory status: spontaneous breathing, nonlabored ventilation and respiratory function stable Cardiovascular status: blood pressure returned to baseline and stable Postop Assessment: adequate PO intake Anesthetic complications: no   No notable events documented.   Last Vitals:  Vitals:   04/15/24 1000 04/15/24 1015  BP: (!) 131/58 (!) 146/61  Pulse: (!) 58 63  Resp: 15 16  Temp:    SpO2: 96% 99%    Last Pain:  Vitals:   04/15/24 1015  TempSrc:   PainSc: 0-No pain                 Alfonso Ruths

## 2024-04-15 NOTE — Anesthesia Procedure Notes (Signed)
 Procedure Name: LMA Insertion Date/Time: 04/15/2024 7:46 AM  Performed by: Veronica Alm BROCKS, CRNAPre-anesthesia Checklist: Patient identified, Emergency Drugs available, Suction available and Patient being monitored Patient Re-evaluated:Patient Re-evaluated prior to induction Oxygen Delivery Method: Circle system utilized Preoxygenation: Pre-oxygenation with 100% oxygen Induction Type: IV induction Ventilation: Mask ventilation without difficulty LMA Size: 3.0 Number of attempts: 1 Placement Confirmation: positive ETCO2, CO2 detector and breath sounds checked- equal and bilateral Tube secured with: Tape

## 2024-04-15 NOTE — Op Note (Signed)
    OPERATIVE NOTE   PROCEDURE: 1.  Revision left arm brachiocephalic AV fistula  PRE-OPERATIVE DIAGNOSIS: Complication of left arm AV access with high output cardiac failure; end-stage renal disease requiring hemodialysis  POST-OPERATIVE DIAGNOSIS: Same  SURGEON: Joyce [name]  ASSISTANT(S): None  ANESTHESIA: general  ESTIMATED BLOOD LOSS: 10 cc  FINDING(S): 14 to 16 mm cephalic vein  SPECIMEN(S): Resected cephalic vein to pathology for permanent section  INDICATIONS:   Joyce [name] is a 78 y.o. female who presents with high-output cardiac failure.  With compression of the AV fistula her parameters returned to near normal.  Given this finding revision of her AV fistula to reduce volume flow is indicated.  Risks and benefits have been reviewed all questions answered patient agrees to proceed..  DESCRIPTION: After full informed written consent was obtained from the patient, the patient was brought back to the operating room and placed supine upon the operating table.  Prior to induction, the patient received IV antibiotics.   After obtaining adequate anesthesia, the patient was then prepped and draped in the standard fashion for a revision of the left arm AV fistula.  Curvilinear incision was created over the anastomosis of the AV fistula near the antecubital fossa.  Dissection was carried down through the skin and soft tissues to expose the cephalic vein itself.  This was then dissected circumferentially for a distance of approximately 4 to 5 cm.  The dissection was carried down to identify the brachial artery and the actual anastomosis however the dissection was limited to the venous portion itself.  The brachial artery was not dissected.  Once I was able to crossclamped the fistula proximally and distally 4000 units of heparin  was given.  Not only did the fistula measure 14+ millimeters in diameter but there was an associated kink near the arterial anastomosis.  Given these  2 findings simply banding the fistula would not have been adequate or feasible.  I elected to perform a resection and then taper the existing cephalic vein.  The vein was clamped proximally and distally and approximately 10 to 12 mm of vein was resected and passed off the field given the size of the vein it is labeled as venous aneurysm.  Next the large end/proximal portion of the fistula was reshaped by removing a rectangular portion using tenotomy scissors.  The cut end of the vein was then oversewn using 6-0 Prolene.  This created a segment that measured approximately 4 mm in diameter and 20 mm in length.  It was then approximated to the cut end of the AV fistula just above the anastomosis.  End-to-end anastomosis was fashioned using the four-quadrant technique with 4 interrupted 6-0 Prolene sutures then sewn to each other.  Flushing maneuvers were performed.  Any gaps were ligated with 6-0 Prolene.  Flow was reestablished to the fistula.  Excellent thrill was noted.  At this point though reconstruction visually looks very much like the 4 mm tapered segment on a Gore-Tex graft.  This should prevent the high output failure.  The patient tolerated this procedure well.   COMPLICATIONS: None  CONDITION: Joyce [name] [name]  Office: [phone]   04/15/2024, 10:00 AM

## 2024-04-15 NOTE — Transfer of Care (Signed)
 Immediate Anesthesia Transfer of Care Note  Patient: Joyce [name]  Procedure(s) Performed: REVISON OF ARTERIOVENOUS FISTULA (Left)  Patient Location: PACU  Anesthesia Type:General  Level of Consciousness: sedated  Airway & Oxygen Therapy: Patient Spontanous Breathing  Post-op Assessment: Report given to RN  Post vital signs: Reviewed and stable  Last Vitals:  Vitals Value Taken Time  BP 129/51 04/15/24 09:54  Temp    Pulse 57 04/15/24 09:56  Resp 18 04/15/24 09:56  SpO2 96 % 04/15/24 09:56  Vitals shown include unfiled device data.  Last Pain:  Vitals:   04/15/24 0628  TempSrc: Temporal  PainSc: 0-No pain         Complications: No notable events documented.

## 2024-04-15 NOTE — Interval H&P Note (Signed)
 History and Physical Interval Note:  04/15/2024 7:32 AM  Joyce [name]  has presented today for surgery, with the diagnosis of ESRD.  The various methods of treatment have been discussed with the patient and family. After consideration of risks, benefits and other options for treatment, the patient has consented to  Procedure(s): LIGATION OF ARTERIOVENOUS  FISTULA (Left) as a surgical intervention.  The patient's history has been reviewed, patient examined, no change in status, stable for surgery.  I have reviewed the patient's chart and labs.  Questions were answered to the patient's satisfaction.     Cordella Shawl

## 2024-04-15 NOTE — Anesthesia Preprocedure Evaluation (Addendum)
 Anesthesia Evaluation  Patient identified by MRN, date of birth, ID band Patient awake    Reviewed: Allergy & Precautions, NPO status , Patient's Chart, lab work & pertinent test results  History of Anesthesia Complications Negative for: history of anesthetic complications  Airway Mallampati: IV   Neck ROM: Full    Dental  (+) Missing   Pulmonary neg pulmonary ROS   Pulmonary exam normal breath sounds clear to auscultation       Cardiovascular hypertension, Normal cardiovascular exam Rhythm:Regular Rate:Normal  ECG 12/08/23: NSR; LAD  Myocardial perfusion 09/02/21:    The study is normal. The study is low risk.   CT attenuation images showed minimal coronary calcifications.   LV perfusion is normal. There is no evidence of ischemia. There is no evidence of infarction.   Left ventricular function is normal. End diastolic cavity size is normal. End systolic cavity size is normal.    Neuro/Psych  Headaches PSYCHIATRIC DISORDERS   Bipolar Disorder   Vertigo; C1 posterior arch fracture 12/03/23    GI/Hepatic hiatal hernia,GERD  ,,  Endo/Other  negative endocrine ROS    Renal/GU ESRF and DialysisRenal disease (last HD 04/13/24)     Musculoskeletal   Abdominal   Peds  Hematology  (+) Blood dyscrasia, anemia   Anesthesia Other Findings Neurosurgery note 12/24/23:  Assessment and Plan: Ms. Gibeault had a fall on 12/03/23 and was found to have  C1 posterior arch fracture.    She has intermittent right sided neck pain. No radiation to her arms. No numbness, tingling or weakness in her arms or legs. Neck pain is improving. She is back to her normal activities.    Difficult to visualize fracture on above xrays, but this fracture is stable. Clinically, she looks good and pain is improving.    Treatment options discussed with patient and following plan made:    - Collar was for comfort only so she does not need to wear it.  -  Will review xrays with Dr. Claudene and call her with his recommendations. She gives us  permission to call facility as well [phone] and fax [phone]   I spent a total of 30 minutes in face-to-face and non-face-to-face activities related to this patient's care today including review of outside records, review of imaging, review of symptoms, physical exam, discussion of differential diagnosis, discussion of treatment options, and documentation.    ADDENDUM 01/01/24:  Cervical xrays dated 12/24/23:  FINDINGS: Discontinuity at the posterior arch of C1 which may relate to combination of known incomplete posterior arch as well as CT demonstrated right posterior arch fracture but alignment appears stable compared with radiographs from 12/08/2023. No displacement of the anterior arch with flexion or extension   IMPRESSION: Discontinuity at the posterior arch of C1 which may relate to combination of known incomplete posterior arch as well as CT demonstrated right posterior arch fracture but alignment appears stable compared with radiographs from 12/08/2023.     Electronically Signed   By: Luke Bun M.D.   On: 12/30/2023 23:54   Above xrays reviewed with Dr. Claudene. She can d/c cervical collar and return to activity as tolerated. She can follow up prn.    Updated note will be faxed to facility. I called number in chart and left her a message as well.    I called her back later and spoke with patient. She is aware of above. She is not having any neck pain.    Glade Boys PA-C Dept. of Neurosurgery  Reproductive/Obstetrics                              Anesthesia Physical Anesthesia Plan  ASA: 4  Anesthesia Plan: General   Post-op Pain Management:    Induction: Intravenous  PONV Risk Score and Plan: 3 and Ondansetron , Dexamethasone  and Treatment may vary due to age or medical condition  Airway Management Planned: LMA  Additional Equipment:    Intra-op Plan:   Post-operative Plan: Extubation in OR  Informed Consent: I have reviewed the patients History and Physical, chart, labs and discussed the procedure including the risks, benefits and alternatives for the proposed anesthesia with the patient or authorized representative who has indicated his/her understanding and acceptance.     Dental advisory given  Plan Discussed with: CRNA  Anesthesia Plan Comments: (Patient consented for risks of anesthesia including but not limited to:  - adverse reactions to medications - damage to eyes, teeth, lips or other oral mucosa - nerve damage due to positioning  - sore throat or hoarseness - damage to heart, brain, nerves, lungs, other parts of body or loss of life  Informed patient about role of CRNA in peri- and intra-operative care.  Patient voiced understanding.)         Anesthesia Quick Evaluation

## 2024-04-16 ENCOUNTER — Encounter: Payer: Self-pay | Admitting: Vascular Surgery

## 2024-04-18 LAB — SURGICAL PATHOLOGY

## 2024-04-27 NOTE — Progress Notes (Unsigned)
 Patient ID: Joyce [name], female   DOB: [birth year], 78 y.o.   MRN: [id number]  No chief complaint on file.   HPI Joyce [name] is a 79 y.o. female.    Procedure 04/15/2024:  Revision left arm brachiocephalic AV fistula   No complaints regarding the surgical site.  She actually notes that they are no longer having bleeding issues.  She also states she feels better overall.   Past Medical History:  Diagnosis Date   Anal fissure    Bipolar affective disorder (HCC)    Colon polyps    Diverticulitis    ESRD (end stage renal disease) on dialysis (M-W-F)    Family history of adverse reaction to anesthesia    mom - PONV   GERD (gastroesophageal reflux disease)    Heart murmur    History of hiatal hernia    Hypertension    Migraines    Pancreatitis    Valproic acid   Vertigo     Past Surgical History:  Procedure Laterality Date   A/V FISTULAGRAM Left 04/14/2017   Procedure: A/V Fistulagram;  Surgeon: Jama Cordella MATSU, MD;  Location: [facility];  Service: Cardiovascular;  Laterality: Left;   A/V FISTULAGRAM Left 06/09/2017   Procedure: A/V FISTULAGRAM;  Surgeon: Jama Cordella MATSU, MD;  Location: [facility];  Service: Cardiovascular;  Laterality: Left;   A/V FISTULAGRAM Left 11/02/2017   Procedure: A/V FISTULAGRAM;  Surgeon: Marea Selinda RAMAN, MD;  Location: [facility];  Service: Cardiovascular;  Laterality: Left;   A/V FISTULAGRAM Left 12/22/2018   Procedure: A/V FISTULAGRAM;  Surgeon: Jama Cordella MATSU, MD;  Location: [facility];  Service: Cardiovascular;  Laterality: Left;   A/V FISTULAGRAM Left 06/12/2020   Procedure: A/V FISTULAGRAM;  Surgeon: Jama Cordella MATSU, MD;  Location: [facility];  Service: Cardiovascular;  Laterality: Left;   A/V FISTULAGRAM Left 01/22/2021   Procedure: A/V FISTULAGRAM;  Surgeon: Jama Cordella MATSU, MD;  Location: [facility];  Service: Cardiovascular;  Laterality: Left;   A/V  FISTULAGRAM Left 05/20/2022   Procedure: A/V Fistulagram;  Surgeon: Jama Cordella MATSU, MD;  Location: [facility];  Service: Cardiovascular;  Laterality: Left;   A/V SHUNT INTERVENTION N/A 11/02/2017   Procedure: A/V SHUNT INTERVENTION;  Surgeon: Marea Selinda RAMAN, MD;  Location: [facility];  Service: Cardiovascular;  Laterality: N/A;   ABDOMINAL HYSTERECTOMY  1990 ?   AV FISTULA PLACEMENT  4/30   BREAST EXCISIONAL BIOPSY Left 1994   neg surgical bx   CHOLECYSTECTOMY  2003   COLONOSCOPY  2014   Dr. Dellie   COLONOSCOPY WITH PROPOFOL  N/A 09/24/2015   Procedure: COLONOSCOPY WITH random colon byopies.;  Surgeon: Rogelia Copping, MD;  Location: [facility];  Service: Endoscopy;  Laterality: N/A;   ESOPHAGOGASTRODUODENOSCOPY (EGD) WITH PROPOFOL  N/A 09/24/2015   Procedure: ESOPHAGOGASTRODUODENOSCOPY (EGD) ;  Surgeon: Rogelia Copping, MD;  Location: [facility];  Service: Endoscopy;  Laterality: N/A;   ESOPHAGOGASTRODUODENOSCOPY (EGD) WITH PROPOFOL  N/A 12/11/2020   Procedure: ESOPHAGOGASTRODUODENOSCOPY (EGD) WITH PROPOFOL ;  Surgeon: Copping Rogelia, MD;  Location: [facility];  Service: Endoscopy;  Laterality: N/A;   EYE SURGERY     PERIPHERAL VASCULAR CATHETERIZATION N/A 05/29/2015   Procedure: A/V Shuntogram/Fistulagram;  Surgeon: Cordella MATSU Jama, MD;  Location: [facility];  Service: Cardiovascular;  Laterality: N/A;   PERIPHERAL VASCULAR CATHETERIZATION N/A 05/29/2015   Procedure: A/V Shunt Intervention;  Surgeon: Cordella MATSU Jama, MD;  Location: [facility];  Service: Cardiovascular;  Laterality: N/A;   POLYPECTOMY  09/24/2015   Procedure: POLYPECTOMY INTESTINAL;  Surgeon: Rogelia Copping, MD;  Location: [facility];  Service: Endoscopy;;  cecal polyp ascending polyp   REVISON OF ARTERIOVENOUS FISTULA Left 08/09/2021   Procedure: REVISON OF ARTERIOVENOUS FISTULA ( BRACHIAL CEPHALIC);  Surgeon: Jama Cordella MATSU, MD;  Location: [facility];  Service:  Vascular;  Laterality: Left;   REVISON OF ARTERIOVENOUS FISTULA Left 04/15/2024   Procedure: REVISON OF ARTERIOVENOUS FISTULA;  Surgeon: Jama Cordella MATSU, MD;  Location: [facility];  Service: Vascular;  Laterality: Left;   UPPER EXTREMITY ANGIOGRAM Left 08/09/2021   Procedure: UPPER EXTREMITY ANGIOGRAM ( POSSIBLE INTERVENTION);  Surgeon: Jama Cordella MATSU, MD;  Location: [facility];  Service: Vascular;  Laterality: Left;   UPPER EXTREMITY ANGIOGRAPHY Left 07/02/2021   Procedure: UPPER EXTREMITY ANGIOGRAPHY;  Surgeon: Jama Cordella MATSU, MD;  Location: [facility];  Service: Cardiovascular;  Laterality: Left;      Allergies  Allergen Reactions   Morphine  And Codeine Shortness Of Breath    Pt reports chest pain and difficulty breathing.   Gabapentin      PER MAR   Indomethacin Hives   Penicillins Other (See Comments)    PER MAR   Pollen Extract Other (See Comments)    Sinus problems and HA    Current Outpatient Medications  Medication Sig Dispense Refill   acetaminophen  (TYLENOL ) 500 MG tablet Take 500 mg by mouth every 6 (six) hours as needed (pain.).     amLODipine  (NORVASC ) 2.5 MG tablet Take 2.5 mg by mouth in the morning. (0800)     Azelastine HCl 137 MCG/SPRAY SOLN Place 1 spray into both nostrils daily. (0800)     calcium  acetate (PHOSLO ) 667 MG capsule Take 667 mg by mouth 3 (three) times daily before meals. (0700-1130-1700)     carbamazepine  (TEGRETOL ) 100 MG chewable tablet Chew 100 mg by mouth 2 (two) times daily. (0600 & 2100)     Cholecalciferol  (VITAMIN D3) 50 MCG (2000 UT) capsule Take 2,000 Units by mouth daily. (0800)     famotidine  (PEPCID ) 20 MG tablet Take 20 mg by mouth 2 (two) times daily. (0800 & 2100)     HYDROcodone -acetaminophen  (NORCO/VICODIN) 5-325 MG tablet Take 1 tablet by mouth every 6 (six) hours as needed for moderate pain (pain score 4-6) or severe pain (pain score 7-10). 20 tablet 0   latanoprost  (XALATAN ) 0.005 % ophthalmic solution Place 1  drop into both eyes at bedtime. (2100)     lidocaine -prilocaine  (EMLA ) cream Apply 1 Application topically as needed (prior to dialysis).     loperamide  (IMODIUM  A-D) 2 MG tablet Take 2 mg by mouth every 6 (six) hours as needed for diarrhea or loose stools.     losartan (COZAAR) 50 MG tablet Take 50 mg by mouth daily. (0800)     midodrine  (PROAMATINE ) 5 MG tablet Take 5 mg by mouth See admin instructions. Take 1 tablet (5 mg) by mouth at start of dialysis as needed for hypotension     ondansetron  (ZOFRAN ) 4 MG tablet Take 4 mg by mouth every 6 (six) hours as needed for vomiting or nausea.     REFRESH TEARS 0.5 % SOLN Place 1 drop into both eyes every 8 (eight) hours as needed (dry eyes.).     timolol  (TIMOPTIC ) 0.5 % ophthalmic solution Place 1 drop into both eyes 2 (two) times daily.     traZODone  (DESYREL ) 50 MG  tablet Take 50 mg by mouth at bedtime. (2100)     vitamin B-12 (CYANOCOBALAMIN ) 1000 MCG tablet Take 1,000 mcg by mouth in the morning. (0800)     ziprasidone  (GEODON ) 20 MG capsule Take 20 mg by mouth at bedtime. (2100)     No current facility-administered medications for this visit.        Physical Exam There were no vitals taken for this visit. Gen:  WD/WN, NAD Skin: incision C/D/I she has a 2+ radial pulse on the left     Assessment/Plan: 1. Complication of vascular access for dialysis, sequela (Primary) Recommend: She appears to have had a good result from her banding.  She will follow-up with Dr. Bensimhon as arranged.  The patient is doing well and currently has adequate dialysis access. The patient's dialysis center is not reporting any access issues. Flow pattern is stable when compared to the prior ultrasound.  The patient should have a duplex ultrasound of the dialysis access in 6 months. The patient will follow-up with me in the office after each ultrasound        Cordella Shawl 04/27/2024, 8:28 AM   This note was created with Dragon medical  transcription system.  Any errors from dictation are unintentional.

## 2024-04-28 ENCOUNTER — Encounter (INDEPENDENT_AMBULATORY_CARE_PROVIDER_SITE_OTHER): Payer: Self-pay | Admitting: Vascular Surgery

## 2024-04-28 ENCOUNTER — Ambulatory Visit (INDEPENDENT_AMBULATORY_CARE_PROVIDER_SITE_OTHER): Admitting: Vascular Surgery

## 2024-04-28 VITALS — BP 117/69 | HR 70 | Resp 178 | Wt 135.6 lb

## 2024-04-28 DIAGNOSIS — T829XXS Unspecified complication of cardiac and vascular prosthetic device, implant and graft, sequela: Secondary | ICD-10-CM

## 2024-07-12 ENCOUNTER — Ambulatory Visit: Admitting: Family Medicine

## 2024-07-26 ENCOUNTER — Encounter: Payer: Self-pay | Admitting: Family Medicine

## 2024-07-26 ENCOUNTER — Ambulatory Visit: Admitting: Family Medicine

## 2024-07-26 VITALS — BP 148/65 | HR 68 | Temp 98.7°F | Ht 63.0 in | Wt 136.0 lb

## 2024-07-26 DIAGNOSIS — R011 Cardiac murmur, unspecified: Secondary | ICD-10-CM | POA: Diagnosis not present

## 2024-07-26 DIAGNOSIS — Q613 Polycystic kidney, unspecified: Secondary | ICD-10-CM | POA: Diagnosis not present

## 2024-07-26 DIAGNOSIS — E559 Vitamin D deficiency, unspecified: Secondary | ICD-10-CM | POA: Diagnosis not present

## 2024-07-26 DIAGNOSIS — D518 Other vitamin B12 deficiency anemias: Secondary | ICD-10-CM | POA: Diagnosis not present

## 2024-07-26 DIAGNOSIS — I10 Essential (primary) hypertension: Secondary | ICD-10-CM | POA: Diagnosis not present

## 2024-07-26 DIAGNOSIS — G4701 Insomnia due to medical condition: Secondary | ICD-10-CM | POA: Diagnosis not present

## 2024-07-26 DIAGNOSIS — F319 Bipolar disorder, unspecified: Secondary | ICD-10-CM | POA: Diagnosis not present

## 2024-07-26 NOTE — Assessment & Plan Note (Signed)
 2/6 SEM best heard at [facility]

## 2024-07-26 NOTE — Assessment & Plan Note (Signed)
 Takes trazodone  50mg  nightly and this is effective for her.

## 2024-07-26 NOTE — Assessment & Plan Note (Signed)
 She is on Norvasc  2.5mg  daily losartan 50mg  daily.

## 2024-07-26 NOTE — Assessment & Plan Note (Signed)
 She is on Geodon  (ziprasidone ) and carbamazepine  100mg  daily

## 2024-07-26 NOTE — Progress Notes (Signed)
 "  New Patient Office Visit  Subjective    Patient ID: Joyce [name], female    DOB: 1945/09/17  Age: 79 y.o. MRN: [id number]  CC:  Chief Complaint  Patient presents with   New Patient (Initial Visit)    Patient is here today to get established with the practice. Denies any concerns for today's visit.    HPI Joyce [name] presents to establish care Discussed the use of AI scribe software for clinical note transcription with the patient, who gave verbal consent to proceed.  History of Present Illness   Joyce [name] is a 79 year old female with polycystic kidney disease on dialysis who presents to establish care with a new primary care provider.  She lives in an assisted living facility.  She has been undergoing dialysis for polycystic kidney disease for nearly seven years, attending sessions on Mondays, Wednesdays, and Fridays. Her blood pressure tends to drop during dialysis, and she takes midodrine  5mg  medication to elevate it. She takes amlodipine  2.5 mg and losartan 50 mg for hypertension.  She has a history of bipolar I disorder and is currently taking Geodon . She also takes Tegretol , which she believes is for her nerves.  She has a history of glaucoma in both eyes.  She is blind in her left eye secondary to glaucoma.  She is on medication to manage glaucoma.  She has had a hysterectomy and appendectomy.  She experiences acid reflux and currently takes famotidine .   She has a history of CAP and uses a nasal spray for allergies, which sometimes leads to bronchitis.  She takes trazodone  50 mg at night to aid sleep and vitamin D3 as a supplement. Her family history includes her mother having had a heart attack. She has a son living in [place] with his family, whom she visits occasionally.  She does not smoke or drink alcohol , and she has received her flu, shingles, and pneumonia vaccines. She reports passing a recent memory test and is not currently experiencing  heartburn.       Outpatient Encounter Medications as of 07/26/2024  Medication Sig   acetaminophen  (TYLENOL ) 500 MG tablet Take 500 mg by mouth every 6 (six) hours as needed (pain.).   amLODipine  (NORVASC ) 2.5 MG tablet Take 2.5 mg by mouth in the morning. (0800)   Azelastine HCl 137 MCG/SPRAY SOLN Place 1 spray into both nostrils daily. (0800)   calcium  acetate (PHOSLO ) 667 MG capsule Take 667 mg by mouth 3 (three) times daily before meals. (0700-1130-1700)   carbamazepine  (TEGRETOL ) 100 MG chewable tablet Chew 100 mg by mouth 2 (two) times daily. (0600 & 2100)   Cholecalciferol  (VITAMIN D3) 50 MCG (2000 UT) capsule Take 2,000 Units by mouth daily. (0800)   famotidine  (PEPCID ) 20 MG tablet Take 20 mg by mouth 2 (two) times daily. (0800 & 2100)   latanoprost  (XALATAN ) 0.005 % ophthalmic solution Place 1 drop into both eyes at bedtime. (2100)   lidocaine -prilocaine  (EMLA ) cream Apply 1 Application topically as needed (prior to dialysis).   loperamide  (IMODIUM  A-D) 2 MG tablet Take 2 mg by mouth every 6 (six) hours as needed for diarrhea or loose stools.   losartan (COZAAR) 50 MG tablet Take 50 mg by mouth daily. (0800)   midodrine  (PROAMATINE ) 5 MG tablet Take 5 mg by mouth See admin instructions. Take 1 tablet (5 mg) by mouth at start of dialysis as needed for hypotension   ondansetron  (ZOFRAN ) 4 MG tablet Take 4 mg by mouth  every 6 (six) hours as needed for vomiting or nausea.   REFRESH TEARS 0.5 % SOLN Place 1 drop into both eyes every 8 (eight) hours as needed (dry eyes.).   timolol  (TIMOPTIC ) 0.5 % ophthalmic solution Place 1 drop into both eyes 2 (two) times daily.   traZODone  (DESYREL ) 50 MG tablet Take 50 mg by mouth at bedtime. (2100)   vitamin B-12 (CYANOCOBALAMIN ) 1000 MCG tablet Take 1,000 mcg by mouth in the morning. (0800)   ziprasidone  (GEODON ) 20 MG capsule Take 20 mg by mouth at bedtime. (2100)   HYDROcodone -acetaminophen  (NORCO/VICODIN) 5-325 MG tablet Take 1 tablet by mouth  every 6 (six) hours as needed for moderate pain (pain score 4-6) or severe pain (pain score 7-10).   No facility-administered encounter medications on file as of 07/26/2024.    Past Medical History:  Diagnosis Date   Anal fissure    Bipolar affective disorder (HCC)    Colon polyps    Diverticulitis    ESRD (end stage renal disease) on dialysis (M-W-F)    Family history of adverse reaction to anesthesia    mom - PONV   GERD (gastroesophageal reflux disease)    Heart murmur    History of hiatal hernia    Hypertension    Migraines    Pancreatitis    Valproic acid   Vertigo     Past Surgical History:  Procedure Laterality Date   A/V FISTULAGRAM Left 04/14/2017   Procedure: A/V Fistulagram;  Surgeon: Jama Cordella MATSU, MD;  Location: [facility];  Service: Cardiovascular;  Laterality: Left;   A/V FISTULAGRAM Left 06/09/2017   Procedure: A/V FISTULAGRAM;  Surgeon: Jama Cordella MATSU, MD;  Location: [facility];  Service: Cardiovascular;  Laterality: Left;   A/V FISTULAGRAM Left 11/02/2017   Procedure: A/V FISTULAGRAM;  Surgeon: Marea Selinda RAMAN, MD;  Location: [facility];  Service: Cardiovascular;  Laterality: Left;   A/V FISTULAGRAM Left 12/22/2018   Procedure: A/V FISTULAGRAM;  Surgeon: Jama Cordella MATSU, MD;  Location: [facility];  Service: Cardiovascular;  Laterality: Left;   A/V FISTULAGRAM Left 06/12/2020   Procedure: A/V FISTULAGRAM;  Surgeon: Jama Cordella MATSU, MD;  Location: [facility];  Service: Cardiovascular;  Laterality: Left;   A/V FISTULAGRAM Left 01/22/2021   Procedure: A/V FISTULAGRAM;  Surgeon: Jama Cordella MATSU, MD;  Location: [facility];  Service: Cardiovascular;  Laterality: Left;   A/V FISTULAGRAM Left 05/20/2022   Procedure: A/V Fistulagram;  Surgeon: Jama Cordella MATSU, MD;  Location: [facility];  Service: Cardiovascular;  Laterality: Left;   A/V SHUNT INTERVENTION N/A 11/02/2017   Procedure: A/V  SHUNT INTERVENTION;  Surgeon: Marea Selinda RAMAN, MD;  Location: [facility];  Service: Cardiovascular;  Laterality: N/A;   ABDOMINAL HYSTERECTOMY  1990 ?   AV FISTULA PLACEMENT  4/30   BREAST EXCISIONAL BIOPSY Left 1994   neg surgical bx   CHOLECYSTECTOMY  2003   COLONOSCOPY  2014   Dr. Dellie   COLONOSCOPY WITH PROPOFOL  N/A 09/24/2015   Procedure: COLONOSCOPY WITH random colon byopies.;  Surgeon: Rogelia Copping, MD;  Location: [facility];  Service: Endoscopy;  Laterality: N/A;   ESOPHAGOGASTRODUODENOSCOPY (EGD) WITH PROPOFOL  N/A 09/24/2015   Procedure: ESOPHAGOGASTRODUODENOSCOPY (EGD) ;  Surgeon: Rogelia Copping, MD;  Location: [facility];  Service: Endoscopy;  Laterality: N/A;   ESOPHAGOGASTRODUODENOSCOPY (EGD) WITH PROPOFOL  N/A 12/11/2020   Procedure: ESOPHAGOGASTRODUODENOSCOPY (EGD) WITH PROPOFOL ;  Surgeon: Copping Rogelia, MD;  Location: [facility];  Service: Endoscopy;  Laterality: N/A;   EYE SURGERY     PERIPHERAL VASCULAR CATHETERIZATION N/A 05/29/2015   Procedure: A/V Shuntogram/Fistulagram;  Surgeon: Cordella KANDICE Shawl, MD;  Location: [facility];  Service: Cardiovascular;  Laterality: N/A;   PERIPHERAL VASCULAR CATHETERIZATION N/A 05/29/2015   Procedure: A/V Shunt Intervention;  Surgeon: Cordella KANDICE Shawl, MD;  Location: [facility];  Service: Cardiovascular;  Laterality: N/A;   POLYPECTOMY  09/24/2015   Procedure: POLYPECTOMY INTESTINAL;  Surgeon: Rogelia Copping, MD;  Location: [facility];  Service: Endoscopy;;  cecal polyp ascending polyp   REVISON OF ARTERIOVENOUS FISTULA Left 08/09/2021   Procedure: REVISON OF ARTERIOVENOUS FISTULA ( BRACHIAL CEPHALIC);  Surgeon: Shawl Cordella KANDICE, MD;  Location: [facility];  Service: Vascular;  Laterality: Left;   REVISON OF ARTERIOVENOUS FISTULA Left 04/15/2024   Procedure: REVISON OF ARTERIOVENOUS FISTULA;  Surgeon: Shawl Cordella KANDICE, MD;  Location: [facility];  Service: Vascular;  Laterality: Left;   UPPER  EXTREMITY ANGIOGRAM Left 08/09/2021   Procedure: UPPER EXTREMITY ANGIOGRAM ( POSSIBLE INTERVENTION);  Surgeon: Shawl Cordella KANDICE, MD;  Location: [facility];  Service: Vascular;  Laterality: Left;   UPPER EXTREMITY ANGIOGRAPHY Left 07/02/2021   Procedure: UPPER EXTREMITY ANGIOGRAPHY;  Surgeon: Shawl Cordella KANDICE, MD;  Location: [facility];  Service: Cardiovascular;  Laterality: Left;    Family History  Problem Relation Age of Onset   Stroke Father    Hypertension Father    Breast cancer Other    Colon cancer Neg Hx    Liver disease Neg Hx    Mental illness Neg Hx     Social History   Socioeconomic History   Marital status: Divorced    Spouse name: Not on file   Number of children: 1   Years of education: Not on file   Highest education level: Not on file  Occupational History   Occupation: Lobbyist: [employer]    Comment: retired  Tobacco Use   Smoking status: Never    Passive exposure: Never   Smokeless tobacco: Never  Vaping Use   Vaping status: Never Used  Substance and Sexual Activity   Alcohol  use: No    Alcohol /week: 0.0 standard drinks of alcohol    Drug use: No   Sexual activity: Not Currently  Other Topics Concern   Not on file  Social History Narrative   Patient lives in [facility]. ([place])   Walks independently.   Social Drivers of Health   Tobacco Use: Low Risk (07/26/2024)   Patient History    Smoking Tobacco Use: Never    Smokeless Tobacco Use: Never    Passive Exposure: Never  Financial Resource Strain: Not on file  Food Insecurity: Not on file  Transportation Needs: Not on file  Physical Activity: Not on file  Stress: Not on file  Social Connections: Not on file  Intimate Partner Violence: Not on file  Depression (PHQ2-9): Low Risk (07/26/2024)   Depression (PHQ2-9)    PHQ-2 Score: 0  Alcohol  Screen: Not on file  Housing: Not on file  Utilities: Not on file  Health Literacy: Not on file         Objective   BP (!) 148/65 (BP Location: Right Arm, Patient Position: Sitting, Cuff Size: Normal)   Pulse 68   Temp 98.7 F (37.1 C) (Oral)   Ht 5' 3 (1.6 m)   Wt 136 lb (61.7 kg)   SpO2 96%  BMI 24.09 kg/m    Physical Exam Vitals and nursing note reviewed.  Constitutional:      Appearance: Normal appearance.  HENT:     Head: Normocephalic and atraumatic.  Eyes:     Conjunctiva/sclera: Conjunctivae normal.  Cardiovascular:     Rate and Rhythm: Normal rate and regular rhythm.  Pulmonary:     Effort: Pulmonary effort is normal.     Breath sounds: Normal breath sounds.  Musculoskeletal:     Right lower leg: No edema.     Left lower leg: No edema.  Skin:    General: Skin is warm and dry.  Neurological:     Mental Status: She is alert and oriented to person, place, and time.  Psychiatric:        Mood and Affect: Mood normal.        Behavior: Behavior normal.        Thought Content: Thought content normal.        Judgment: Judgment normal.            The ASCVD Risk score (Arnett DK, et al., 2019) failed to calculate for the following reasons:   Risk score cannot be calculated because patient has a medical history suggesting prior/existing ASCVD   * - Cholesterol units were assumed     Assessment & Plan:  Primary hypertension Assessment & Plan: She is on Norvasc  2.5mg  daily losartan 50mg  daily.     Polycystic kidney Assessment & Plan: Caused her ESRD.  She has had several cyst to rupture and this was very painful.     Other vitamin B12 deficiency anemia Assessment & Plan: Takes oral B12 2000mg  daily.  Will check her B12 level.     Insomnia due to medical condition Assessment & Plan: Takes trazodone  50mg  nightly and this is effective for her.     Bipolar 1 disorder (HCC) Assessment & Plan: She is on Geodon  (ziprasidone ) and carbamazepine  100mg  daily   Cardiac murmur Assessment & Plan: 2/6 SEM best heard at [facility]   Vitamin D   deficiency Assessment & Plan: She is taking vitamin D3 2000 international units  daily.  Will check her level.       Return in about 3 months (around 10/24/2024).   Akina Maish K Cesare Sumlin, MD  "

## 2024-07-26 NOTE — Assessment & Plan Note (Signed)
 She is taking vitamin D3 2000 international units  daily.  Will check her level.

## 2024-07-26 NOTE — Assessment & Plan Note (Signed)
 Caused her ESRD.  She has had several cyst to rupture and this was very painful.

## 2024-07-26 NOTE — Assessment & Plan Note (Signed)
 Takes oral B12 2000mg  daily.  Will check her B12 level.

## 2024-08-04 ENCOUNTER — Other Ambulatory Visit: Payer: Self-pay | Admitting: Family Medicine

## 2024-08-04 ENCOUNTER — Ambulatory Visit

## 2024-08-04 DIAGNOSIS — I214 Non-ST elevation (NSTEMI) myocardial infarction: Secondary | ICD-10-CM

## 2024-08-04 DIAGNOSIS — I10 Essential (primary) hypertension: Secondary | ICD-10-CM

## 2024-08-04 DIAGNOSIS — R739 Hyperglycemia, unspecified: Secondary | ICD-10-CM

## 2024-08-05 ENCOUNTER — Telehealth: Payer: Self-pay

## 2024-08-05 LAB — CBC WITH DIFFERENTIAL/PLATELET
Basophils Absolute: 0 x10E3/uL (ref 0.0–0.2)
Basos: 1 %
EOS (ABSOLUTE): 0.1 x10E3/uL (ref 0.0–0.4)
Eos: 2 %
Hematocrit: 34.5 % (ref 34.0–46.6)
Hemoglobin: 11.1 g/dL (ref 11.1–15.9)
Immature Grans (Abs): 0 x10E3/uL (ref 0.0–0.1)
Immature Granulocytes: 0 %
Lymphocytes Absolute: 1.1 x10E3/uL (ref 0.7–3.1)
Lymphs: 22 %
MCH: 33.4 pg — ABNORMAL HIGH (ref 26.6–33.0)
MCHC: 32.2 g/dL (ref 31.5–35.7)
MCV: 104 fL — ABNORMAL HIGH (ref 79–97)
Monocytes Absolute: 0.4 x10E3/uL (ref 0.1–0.9)
Monocytes: 7 %
Neutrophils Absolute: 3.6 x10E3/uL (ref 1.4–7.0)
Neutrophils: 68 %
Platelets: 203 x10E3/uL (ref 150–450)
RBC: 3.32 x10E6/uL — ABNORMAL LOW (ref 3.77–5.28)
RDW: 12.8 % (ref 11.7–15.4)
WBC: 5.2 x10E3/uL (ref 3.4–10.8)

## 2024-08-05 LAB — COMPREHENSIVE METABOLIC PANEL WITH GFR
ALT: 7 IU/L (ref 0–32)
AST: 11 IU/L (ref 0–40)
Albumin: 4 g/dL (ref 3.8–4.8)
Alkaline Phosphatase: 55 IU/L (ref 49–135)
BUN/Creatinine Ratio: 4 — ABNORMAL LOW (ref 12–28)
BUN: 23 mg/dL (ref 8–27)
Bilirubin Total: 0.3 mg/dL (ref 0.0–1.2)
CO2: 26 mmol/L (ref 20–29)
Calcium: 9.9 mg/dL (ref 8.7–10.3)
Chloride: 98 mmol/L (ref 96–106)
Creatinine, Ser: 5.95 mg/dL — ABNORMAL HIGH (ref 0.57–1.00)
Globulin, Total: 2 g/dL (ref 1.5–4.5)
Glucose: 88 mg/dL (ref 70–99)
Potassium: 5.2 mmol/L (ref 3.5–5.2)
Sodium: 138 mmol/L (ref 134–144)
Total Protein: 6 g/dL (ref 6.0–8.5)
eGFR: 7 mL/min/1.73 — ABNORMAL LOW

## 2024-08-05 LAB — LIPID PANEL
Chol/HDL Ratio: 2.8 ratio (ref 0.0–4.4)
Cholesterol, Total: 168 mg/dL (ref 100–199)
HDL: 61 mg/dL
LDL Chol Calc (NIH): 90 mg/dL (ref 0–99)
Triglycerides: 93 mg/dL (ref 0–149)
VLDL Cholesterol Cal: 17 mg/dL (ref 5–40)

## 2024-08-05 LAB — TSH: TSH: 3.82 u[IU]/mL (ref 0.450–4.500)

## 2024-08-05 LAB — T4, FREE: Free T4: 0.91 ng/dL (ref 0.82–1.77)

## 2024-08-05 NOTE — Telephone Encounter (Signed)
Please review. Message sent to the wrong office.  KP

## 2024-08-05 NOTE — Telephone Encounter (Signed)
 Copied from CRM [id number]. Topic: General - Other >> Aug 05, 2024 10:51 AM Tonda B wrote: Reason for CRM: patient missed call from office please call pt back [phone]

## 2024-08-08 ENCOUNTER — Telehealth: Payer: Self-pay | Admitting: Family Medicine

## 2024-08-08 NOTE — Telephone Encounter (Signed)
 Called and LM that I called.  I do not know who called from the office.

## 2024-08-09 ENCOUNTER — Ambulatory Visit: Payer: Self-pay | Admitting: Family Medicine

## 2024-08-11 ENCOUNTER — Ambulatory Visit: Attending: Cardiology | Admitting: Cardiology

## 2024-08-11 ENCOUNTER — Telehealth: Payer: Self-pay | Admitting: Family Medicine

## 2024-08-11 ENCOUNTER — Encounter: Payer: Self-pay | Admitting: Cardiology

## 2024-08-11 VITALS — BP 140/54 | HR 62 | Ht 63.0 in | Wt 140.2 lb

## 2024-08-11 DIAGNOSIS — I959 Hypotension, unspecified: Secondary | ICD-10-CM

## 2024-08-11 DIAGNOSIS — R011 Cardiac murmur, unspecified: Secondary | ICD-10-CM

## 2024-08-11 DIAGNOSIS — R0989 Other specified symptoms and signs involving the circulatory and respiratory systems: Secondary | ICD-10-CM

## 2024-08-11 NOTE — Progress Notes (Signed)
 " Cardiology Office Note:    Date:  08/11/2024   ID:  Camara, Renstrom Mar 28, 1946, MRN [id number]  PCP:  Ziglar, Susan K, MD  Cardiologist:  Redell Cave, MD  Electrophysiologist:  None   Referring MD: Housecalls, Doctors Making   Chief Complaint  Patient presents with   Follow-up    12 month follow up pat has been doing well with no complaints of chest pain, chest pressure or SOB, medication reviewed verbally with patient    History of Present Illness:    Joyce [name] is a 79 y.o. female with a hx of GERD, hypertension, ESRD on dialysis MWF, hemodialysis induced hypotension who presents for follow-up.   States having bleeding from AV fistula x 4 episodes after dialysis.  Vascular service was concerned about high-output heart failure.  Has not had any bleeding since.  BP typically elevated, but becomes low during dialysis.  Takes 5 mg of midodrine  on dialysis days.  Denies chest pain or shortness of breath.  Denies edema.  Prior notes History of NSTEMI, Lexiscan  Myoview  08/2021 low risk study, no ischemia.  No aspirin  due to anemia. Echo 08/2021 EF 60 to 65% Echocardiogram 05/2019 showed normal systolic function, EF 60 to 65%. Echo 11/16/2020 normal systolic function, EF 55 to 60%.  Grade 2 diastolic dysfunction. Cardiac monitor 11/13/2020, no significant arrhythmias to explain syncope, occasional SVTs.  Past Medical History:  Diagnosis Date   Anal fissure    Bipolar affective disorder (HCC)    Colon polyps    Diverticulitis    ESRD (end stage renal disease) on dialysis (M-W-F)    Family history of adverse reaction to anesthesia    mom - PONV   GERD (gastroesophageal reflux disease)    Heart murmur    History of hiatal hernia    Hypertension    Migraines    Pancreatitis    Valproic acid   Vertigo     Past Surgical History:  Procedure Laterality Date   A/V FISTULAGRAM Left 04/14/2017   Procedure: A/V Fistulagram;  Surgeon: Jama Cordella MATSU, MD;  Location:  [facility];  Service: Cardiovascular;  Laterality: Left;   A/V FISTULAGRAM Left 06/09/2017   Procedure: A/V FISTULAGRAM;  Surgeon: Jama Cordella MATSU, MD;  Location: [facility];  Service: Cardiovascular;  Laterality: Left;   A/V FISTULAGRAM Left 11/02/2017   Procedure: A/V FISTULAGRAM;  Surgeon: Marea Selinda RAMAN, MD;  Location: [facility];  Service: Cardiovascular;  Laterality: Left;   A/V FISTULAGRAM Left 12/22/2018   Procedure: A/V FISTULAGRAM;  Surgeon: Jama Cordella MATSU, MD;  Location: [facility];  Service: Cardiovascular;  Laterality: Left;   A/V FISTULAGRAM Left 06/12/2020   Procedure: A/V FISTULAGRAM;  Surgeon: Jama Cordella MATSU, MD;  Location: [facility];  Service: Cardiovascular;  Laterality: Left;   A/V FISTULAGRAM Left 01/22/2021   Procedure: A/V FISTULAGRAM;  Surgeon: Jama Cordella MATSU, MD;  Location: [facility];  Service: Cardiovascular;  Laterality: Left;   A/V FISTULAGRAM Left 05/20/2022   Procedure: A/V Fistulagram;  Surgeon: Jama Cordella MATSU, MD;  Location: [facility];  Service: Cardiovascular;  Laterality: Left;   A/V SHUNT INTERVENTION N/A 11/02/2017   Procedure: A/V SHUNT INTERVENTION;  Surgeon: Marea Selinda RAMAN, MD;  Location: [facility];  Service: Cardiovascular;  Laterality: N/A;   ABDOMINAL HYSTERECTOMY  1990 ?   AV FISTULA PLACEMENT  4/30   BREAST EXCISIONAL BIOPSY Left 1994   neg surgical  bx   CHOLECYSTECTOMY  2003   COLONOSCOPY  2014   Dr. Dellie   COLONOSCOPY WITH PROPOFOL  N/A 09/24/2015   Procedure: COLONOSCOPY WITH random colon byopies.;  Surgeon: Rogelia Copping, MD;  Location: [facility];  Service: Endoscopy;  Laterality: N/A;   ESOPHAGOGASTRODUODENOSCOPY (EGD) WITH PROPOFOL  N/A 09/24/2015   Procedure: ESOPHAGOGASTRODUODENOSCOPY (EGD) ;  Surgeon: Rogelia Copping, MD;  Location: [facility];  Service: Endoscopy;  Laterality: N/A;   ESOPHAGOGASTRODUODENOSCOPY (EGD) WITH PROPOFOL  N/A  12/11/2020   Procedure: ESOPHAGOGASTRODUODENOSCOPY (EGD) WITH PROPOFOL ;  Surgeon: Copping Rogelia, MD;  Location: [facility];  Service: Endoscopy;  Laterality: N/A;   EYE SURGERY     PERIPHERAL VASCULAR CATHETERIZATION N/A 05/29/2015   Procedure: A/V Shuntogram/Fistulagram;  Surgeon: Cordella KANDICE Shawl, MD;  Location: [facility];  Service: Cardiovascular;  Laterality: N/A;   PERIPHERAL VASCULAR CATHETERIZATION N/A 05/29/2015   Procedure: A/V Shunt Intervention;  Surgeon: Cordella KANDICE Shawl, MD;  Location: [facility];  Service: Cardiovascular;  Laterality: N/A;   POLYPECTOMY  09/24/2015   Procedure: POLYPECTOMY INTESTINAL;  Surgeon: Rogelia Copping, MD;  Location: [facility];  Service: Endoscopy;;  cecal polyp ascending polyp   REVISON OF ARTERIOVENOUS FISTULA Left 08/09/2021   Procedure: REVISON OF ARTERIOVENOUS FISTULA ( BRACHIAL CEPHALIC);  Surgeon: Shawl Cordella KANDICE, MD;  Location: [facility];  Service: Vascular;  Laterality: Left;   REVISON OF ARTERIOVENOUS FISTULA Left 04/15/2024   Procedure: REVISON OF ARTERIOVENOUS FISTULA;  Surgeon: Shawl Cordella KANDICE, MD;  Location: [facility];  Service: Vascular;  Laterality: Left;   UPPER EXTREMITY ANGIOGRAM Left 08/09/2021   Procedure: UPPER EXTREMITY ANGIOGRAM ( POSSIBLE INTERVENTION);  Surgeon: Shawl Cordella KANDICE, MD;  Location: [facility];  Service: Vascular;  Laterality: Left;   UPPER EXTREMITY ANGIOGRAPHY Left 07/02/2021   Procedure: UPPER EXTREMITY ANGIOGRAPHY;  Surgeon: Shawl Cordella KANDICE, MD;  Location: [facility];  Service: Cardiovascular;  Laterality: Left;    Current Medications: Current Meds  Medication Sig   acetaminophen  (TYLENOL ) 500 MG tablet Take 500 mg by mouth every 6 (six) hours as needed (pain.).   amLODipine  (NORVASC ) 2.5 MG tablet Take 2.5 mg by mouth in the morning. (0800)   Azelastine HCl 137 MCG/SPRAY SOLN Place 1 spray into both nostrils daily. (0800)   calcium  acetate (PHOSLO ) 667 MG capsule Take  667 mg by mouth 3 (three) times daily before meals. (0700-1130-1700)   carbamazepine  (TEGRETOL ) 100 MG chewable tablet Chew 100 mg by mouth 2 (two) times daily. (0600 & 2100)   Cholecalciferol  (VITAMIN D3) 50 MCG (2000 UT) capsule Take 2,000 Units by mouth daily. (0800)   famotidine  (PEPCID ) 20 MG tablet Take 20 mg by mouth 2 (two) times daily. (0800 & 2100)   HYDROcodone -acetaminophen  (NORCO/VICODIN) 5-325 MG tablet Take 1 tablet by mouth every 6 (six) hours as needed for moderate pain (pain score 4-6) or severe pain (pain score 7-10).   latanoprost  (XALATAN ) 0.005 % ophthalmic solution Place 1 drop into both eyes at bedtime. (2100)   lidocaine -prilocaine  (EMLA ) cream Apply 1 Application topically as needed (prior to dialysis).   loperamide  (IMODIUM  A-D) 2 MG tablet Take 2 mg by mouth every 6 (six) hours as needed for diarrhea or loose stools.   losartan (COZAAR) 50 MG tablet Take 50 mg by mouth daily. (0800)   midodrine  (PROAMATINE ) 5 MG tablet Take 5 mg by mouth See admin instructions. Take 1 tablet (5 mg) by mouth at start of dialysis as needed for hypotension   ondansetron  (ZOFRAN )  4 MG tablet Take 4 mg by mouth every 6 (six) hours as needed for vomiting or nausea.   REFRESH TEARS 0.5 % SOLN Place 1 drop into both eyes every 8 (eight) hours as needed (dry eyes.).   timolol  (TIMOPTIC ) 0.5 % ophthalmic solution Place 1 drop into both eyes 2 (two) times daily.   traZODone  (DESYREL ) 50 MG tablet Take 50 mg by mouth at bedtime. (2100)   vitamin B-12 (CYANOCOBALAMIN ) 1000 MCG tablet Take 1,000 mcg by mouth in the morning. (0800)   ziprasidone  (GEODON ) 20 MG capsule Take 20 mg by mouth at bedtime. (2100)     Allergies:   Morphine  and codeine, Gabapentin , Indomethacin, Penicillins, and Pollen extract   Social History   Socioeconomic History   Marital status: Divorced    Spouse name: Not on file   Number of children: 1   Years of education: Not on file   Highest education level: Not on file   Occupational History   Occupation: Lobbyist: [employer]    Comment: retired  Tobacco Use   Smoking status: Never    Passive exposure: Never   Smokeless tobacco: Never  Vaping Use   Vaping status: Never Used  Substance and Sexual Activity   Alcohol  use: No    Alcohol /week: 0.0 standard drinks of alcohol    Drug use: No   Sexual activity: Not Currently  Other Topics Concern   Not on file  Social History Narrative   Patient lives in [facility]. ([place])   Walks independently.   Social Drivers of Health   Tobacco Use: Low Risk (08/11/2024)   Patient History    Smoking Tobacco Use: Never    Smokeless Tobacco Use: Never    Passive Exposure: Never  Financial Resource Strain: Not on file  Food Insecurity: Not on file  Transportation Needs: Not on file  Physical Activity: Not on file  Stress: Not on file  Social Connections: Not on file  Depression (PHQ2-9): Low Risk (07/26/2024)   Depression (PHQ2-9)    PHQ-2 Score: 0  Alcohol  Screen: Not on file  Housing: Not on file  Utilities: Not on file  Health Literacy: Not on file     Family History: The patient's family history includes Breast cancer in an other family member; Hypertension in her father; Stroke in her father. There is no history of Colon cancer, Liver disease, or Mental illness.  ROS:   Please see the history of present illness.     All other systems reviewed and are negative.  EKGs/Labs/Other Studies Reviewed:    The following studies were reviewed today:  EKG Interpretation Date/Time:  Thursday August 11 2024 08:16:48 EST Ventricular Rate:  62 PR Interval:  148 QRS Duration:  84 QT Interval:  404 QTC Calculation: 410 R Axis:   -34  Text Interpretation: Normal sinus rhythm Left axis deviation Confirmed by Darliss Rogue ([id number]) on 08/11/2024 8:36:12 AM    Recent Labs: 08/04/2024: ALT 7; BUN 23; Creatinine, Ser 5.95; Hemoglobin 11.1; Platelets 203; Potassium 5.2; Sodium  138; TSH 3.820  Recent Lipid Panel    Component Value Date/Time   CHOL 168 08/04/2024 0915   CHOL 105 05/14/2014 0412   TRIG 93 08/04/2024 0915   TRIG 127 05/14/2014 0412   HDL 61 08/04/2024 0915   HDL 37 (L) 05/14/2014 0412   CHOLHDL 2.8 08/04/2024 0915   CHOLHDL 2.7 08/31/2021 0532   VLDL 9 08/31/2021 0532   VLDL 25 05/14/2014 0412  LDLCALC 90 08/04/2024 0915   LDLCALC 43 05/14/2014 0412    Physical Exam:    VS:  BP (!) 140/54 (BP Location: Right Arm, Patient Position: Sitting, Cuff Size: Normal)   Pulse 62   Ht 5' 3 (1.6 m)   Wt 140 lb 3.2 oz (63.6 kg)   SpO2 96%   BMI 24.84 kg/m     Wt Readings from Last 3 Encounters:  08/11/24 140 lb 3.2 oz (63.6 kg)  07/26/24 136 lb (61.7 kg)  04/28/24 135 lb 9.6 oz (61.5 kg)     GEN:  Well nourished, well developed in no acute distress HEENT: Normal NECK: No JVD; No carotid bruits CARDIAC: RRR, systolic murmur noted on exam, consistent with AV fistula present RESPIRATORY:  Clear to auscultation without rales, wheezing or rhonchi  ABDOMEN: Soft, non-tender, non-distended MUSCULOSKELETAL:  No edema; left arm AV fistula noted SKIN: Warm and dry NEUROLOGIC:  Alert and oriented x 3 PSYCHIATRIC:  Normal affect   ASSESSMENT:    1. Hypotension, unspecified hypotension type   2. Labile blood pressure   3. Murmur    PLAN:    Hemodialysis induced hypotension.  Blood pressures have been stable with hemodialysis of late.  Takes midodrine  5 mg prior to dialysis.  This has been working effectively. Labile blood pressures.  Controlled on current dose of losartan, amlodipine .  No further hypotension with dialysis.  Continue medications as prescribed.   Systolic murmur, bleeding on AV fistula site.  Currently resolved.  Murmur likely from AV fistula.  Echo 2023 normal EF.  Repeat echocardiogram.  Follow-up in 1 year or earlier as needed  This note was generated in part or whole with voice recognition software. Voice recognition is  usually quite accurate but there are transcription errors that can and very often do occur. I apologize for any typographical errors that were not detected and corrected.  Medication Adjustments/Labs and Tests Ordered: Current medicines are reviewed at length with the patient today.  Concerns regarding medicines are outlined above.  Orders Placed This Encounter  Procedures   EKG 12-Lead   ECHOCARDIOGRAM COMPLETE   No orders of the defined types were placed in this encounter.   Patient Instructions  Medication Instructions:  Your physician recommends that you continue on your current medications as directed. Please refer to the Current Medication list given to you today.   *If you need a refill on your cardiac medications before your next appointment, please call your pharmacy*  Lab Work: No labs ordered today  If you have labs (blood work) drawn today and your tests are completely normal, you will receive your results only by: MyChart Message (if you have MyChart) OR A paper copy in the mail If you have any lab test that is abnormal or we need to change your treatment, we will call you to review the results.  Testing/Procedures: Your physician has requested that you have an echocardiogram. Echocardiography is a painless test that uses sound waves to create images of your heart. It provides your doctor with information about the size and shape of your heart and how well your hearts chambers and valves are working.   You may receive an ultrasound enhancing agent through an IV if needed to better visualize your heart during the echo. This procedure takes approximately one hour.  There are no restrictions for this procedure.  This will take place at [address] [facility] [address]) [address], [place] [address]  Please note: We ask at that you  not bring children with you during ultrasound (echo/ vascular) testing. Due to room size and safety concerns, children are not allowed in the  ultrasound rooms during exams. Our front office staff cannot provide observation of children in our lobby area while testing is being conducted. An adult accompanying a patient to their appointment will only be allowed in the ultrasound room at the discretion of the ultrasound technician under special circumstances. We apologize for any inconvenience.   Follow-Up: At [facility], you and your health needs are our priority.  As part of our continuing mission to provide you with exceptional heart care, our providers are all part of one team.  This team includes your primary Cardiologist (physician) and Advanced Practice Providers or APPs (Physician Assistants and Nurse Practitioners) who all work together to provide you with the care you need, when you need it.  Your next appointment:   1 year(s)  Provider:   You may see Redell Cave, MD or one of the following Advanced Practice Providers on your designated Care Team:   Lonni Meager, NP Lesley Maffucci, PA-C Bernardino Bring, PA-C Cadence [place], PA-C Tylene Lunch, NP Barnie Hila, NP    We recommend signing up for the patient portal called MyChart.  Sign up information is provided on this After Visit Summary.  MyChart is used to connect with patients for Virtual Visits (Telemedicine).  Patients are able to view lab/test results, encounter notes, upcoming appointments, etc.  Non-urgent messages can be sent to your provider as well.   To learn more about what you can do with MyChart, go to [email or web address].              Signed, Redell Cave, MD  08/11/2024 12:14 PM    [facility] "

## 2024-08-11 NOTE — Patient Instructions (Signed)
 Medication Instructions:  Your physician recommends that you continue on your current medications as directed. Please refer to the Current Medication list given to you today.   *If you need a refill on your cardiac medications before your next appointment, please call your pharmacy*  Lab Work: No labs ordered today  If you have labs (blood work) drawn today and your tests are completely normal, you will receive your results only by: MyChart Message (if you have MyChart) OR A paper copy in the mail If you have any lab test that is abnormal or we need to change your treatment, we will call you to review the results.  Testing/Procedures: Your physician has requested that you have an echocardiogram. Echocardiography is a painless test that uses sound waves to create images of your heart. It provides your doctor with information about the size and shape of your heart and how well your heart's chambers and valves are working.   You may receive an ultrasound enhancing agent through an IV if needed to better visualize your heart during the echo. This procedure takes approximately one hour.  There are no restrictions for this procedure.  This will take place at [address] [facility] [address]) [address], [place] [address]  Please note: We ask at that you not bring children with you during ultrasound (echo/ vascular) testing. Due to room size and safety concerns, children are not allowed in the ultrasound rooms during exams. Our front office staff cannot provide observation of children in our lobby area while testing is being conducted. An adult accompanying a patient to their appointment will only be allowed in the ultrasound room at the discretion of the ultrasound technician under special circumstances. We apologize for any inconvenience.   Follow-Up: At [facility], you and your health needs are our priority.  As part of our continuing mission to provide you with exceptional heart  care, our providers are all part of one team.  This team includes your primary Cardiologist (physician) and Advanced Practice Providers or APPs (Physician Assistants and Nurse Practitioners) who all work together to provide you with the care you need, when you need it.  Your next appointment:   1 year(s)  Provider:   You may see Redell Cave, MD or one of the following Advanced Practice Providers on your designated Care Team:   Lonni Meager, NP Lesley Maffucci, PA-C Bernardino Bring, PA-C Cadence [place], PA-C Tylene Lunch, NP Barnie Hila, NP    We recommend signing up for the patient portal called MyChart.  Sign up information is provided on this After Visit Summary.  MyChart is used to connect with patients for Virtual Visits (Telemedicine).  Patients are able to view lab/test results, encounter notes, upcoming appointments, etc.  Non-urgent messages can be sent to your provider as well.   To learn more about what you can do with MyChart, go to [email or web address].

## 2024-08-11 NOTE — Telephone Encounter (Signed)
 I returned her call and she did not answer.  LM that I returned her call.

## 2024-08-12 ENCOUNTER — Telehealth: Payer: Self-pay | Admitting: Family Medicine

## 2024-08-12 NOTE — Telephone Encounter (Signed)
 Copied from CRM [id number]. Topic: Medical Record Request - Other >> Aug 12, 2024 12:45 PM Harlene ORN wrote: Reason for CRM: [facility] - assisted living at [facility] just recevied over some documents but they are missing the last of the documents where the PCPs signature is supposed to be. Called the [facility] Form. Would like to have the document re-faxed over Phone: [phone] Fax: [phone]

## 2024-08-16 ENCOUNTER — Telehealth: Payer: Self-pay | Admitting: Family Medicine

## 2024-08-16 NOTE — Telephone Encounter (Signed)
 Left message advising him not certain what phone call she missed.  If she has something she needs to talk about I be happy to call her back.

## 2024-08-17 NOTE — Telephone Encounter (Signed)
 Left VM for Joyce [name] informing her that the [facility] form was refaxed

## 2024-08-25 ENCOUNTER — Ambulatory Visit

## 2024-08-25 DIAGNOSIS — R011 Cardiac murmur, unspecified: Secondary | ICD-10-CM

## 2024-08-25 LAB — ECHOCARDIOGRAM COMPLETE
AR max vel: 1.87 cm2
AV Area VTI: 1.86 cm2
AV Area mean vel: 1.78 cm2
AV Mean grad: 5 mmHg
AV Peak grad: 9.1 mmHg
Ao pk vel: 1.51 m/s
Area-P 1/2: 4.39 cm2
S' Lateral: 2.7 cm

## 2024-08-26 ENCOUNTER — Ambulatory Visit: Payer: Self-pay | Admitting: Cardiology

## 2024-09-01 ENCOUNTER — Ambulatory Visit: Admitting: Family Medicine

## 2024-10-14 ENCOUNTER — Ambulatory Visit

## 2024-10-27 ENCOUNTER — Encounter (INDEPENDENT_AMBULATORY_CARE_PROVIDER_SITE_OTHER)

## 2024-10-27 ENCOUNTER — Ambulatory Visit (INDEPENDENT_AMBULATORY_CARE_PROVIDER_SITE_OTHER): Admitting: Vascular Surgery
